# Patient Record
Sex: Female | Born: 1965
Health system: Southern US, Community
[De-identification: ages and names within clinical notes are randomized; demographics above are authoritative.]

## PROBLEM LIST (undated history)

## (undated) DIAGNOSIS — E119 Type 2 diabetes mellitus without complications: Secondary | ICD-10-CM

## (undated) DIAGNOSIS — R252 Cramp and spasm: Secondary | ICD-10-CM

## (undated) DIAGNOSIS — R232 Flushing: Secondary | ICD-10-CM

## (undated) DIAGNOSIS — E1110 Type 2 diabetes mellitus with ketoacidosis without coma: Secondary | ICD-10-CM

## (undated) DIAGNOSIS — E1143 Type 2 diabetes mellitus with diabetic autonomic (poly)neuropathy: Secondary | ICD-10-CM

## (undated) DIAGNOSIS — R0789 Other chest pain: Secondary | ICD-10-CM

## (undated) DIAGNOSIS — F17200 Nicotine dependence, unspecified, uncomplicated: Secondary | ICD-10-CM

## (undated) DIAGNOSIS — G8929 Other chronic pain: Secondary | ICD-10-CM

## (undated) DIAGNOSIS — N183 Chronic kidney disease, stage 3 unspecified: Secondary | ICD-10-CM

## (undated) DIAGNOSIS — M2041 Other hammer toe(s) (acquired), right foot: Secondary | ICD-10-CM

## (undated) DIAGNOSIS — F339 Major depressive disorder, recurrent, unspecified: Secondary | ICD-10-CM

## (undated) DIAGNOSIS — N179 Acute kidney failure, unspecified: Secondary | ICD-10-CM

## (undated) DIAGNOSIS — F39 Unspecified mood [affective] disorder: Secondary | ICD-10-CM

## (undated) DIAGNOSIS — F329 Major depressive disorder, single episode, unspecified: Secondary | ICD-10-CM

## (undated) DIAGNOSIS — K3184 Gastroparesis: Secondary | ICD-10-CM

## (undated) DIAGNOSIS — E43 Unspecified severe protein-calorie malnutrition: Secondary | ICD-10-CM

## (undated) DIAGNOSIS — T4145XA Adverse effect of unspecified anesthetic, initial encounter: Secondary | ICD-10-CM

## (undated) DIAGNOSIS — I10 Essential (primary) hypertension: Secondary | ICD-10-CM

## (undated) DIAGNOSIS — M67921 Unspecified disorder of synovium and tendon, right upper arm: Secondary | ICD-10-CM

## (undated) DIAGNOSIS — K5649 Other impaction of intestine: Secondary | ICD-10-CM

## (undated) DIAGNOSIS — I219 Acute myocardial infarction, unspecified: Secondary | ICD-10-CM

## (undated) DIAGNOSIS — T8859XA Other complications of anesthesia, initial encounter: Secondary | ICD-10-CM

## (undated) DIAGNOSIS — K2210 Ulcer of esophagus without bleeding: Secondary | ICD-10-CM

## (undated) DIAGNOSIS — G43909 Migraine, unspecified, not intractable, without status migrainosus: Secondary | ICD-10-CM

## (undated) DIAGNOSIS — R300 Dysuria: Secondary | ICD-10-CM

## (undated) DIAGNOSIS — R319 Hematuria, unspecified: Secondary | ICD-10-CM

## (undated) DIAGNOSIS — E1322 Other specified diabetes mellitus with diabetic chronic kidney disease: Secondary | ICD-10-CM

## (undated) DIAGNOSIS — D649 Anemia, unspecified: Secondary | ICD-10-CM

## (undated) DIAGNOSIS — K859 Acute pancreatitis without necrosis or infection, unspecified: Secondary | ICD-10-CM

## (undated) DIAGNOSIS — R1115 Cyclical vomiting syndrome unrelated to migraine: Secondary | ICD-10-CM

## (undated) DIAGNOSIS — J449 Chronic obstructive pulmonary disease, unspecified: Secondary | ICD-10-CM

## (undated) DIAGNOSIS — F418 Other specified anxiety disorders: Secondary | ICD-10-CM

## (undated) DIAGNOSIS — B351 Tinea unguium: Secondary | ICD-10-CM

## (undated) DIAGNOSIS — Z9189 Other specified personal risk factors, not elsewhere classified: Secondary | ICD-10-CM

## (undated) DIAGNOSIS — F1290 Cannabis use, unspecified, uncomplicated: Secondary | ICD-10-CM

## (undated) DIAGNOSIS — R112 Nausea with vomiting, unspecified: Secondary | ICD-10-CM

## (undated) DIAGNOSIS — M1990 Unspecified osteoarthritis, unspecified site: Secondary | ICD-10-CM

## (undated) DIAGNOSIS — M75100 Unspecified rotator cuff tear or rupture of unspecified shoulder, not specified as traumatic: Secondary | ICD-10-CM

## (undated) DIAGNOSIS — Z609 Problem related to social environment, unspecified: Secondary | ICD-10-CM

## (undated) DIAGNOSIS — R1084 Generalized abdominal pain: Secondary | ICD-10-CM

## (undated) DIAGNOSIS — F4311 Post-traumatic stress disorder, acute: Secondary | ICD-10-CM

## (undated) DIAGNOSIS — H3320 Serous retinal detachment, unspecified eye: Secondary | ICD-10-CM

## (undated) DIAGNOSIS — E1365 Other specified diabetes mellitus with hyperglycemia: Secondary | ICD-10-CM

## (undated) DIAGNOSIS — E559 Vitamin D deficiency, unspecified: Secondary | ICD-10-CM

## (undated) DIAGNOSIS — G709 Myoneural disorder, unspecified: Secondary | ICD-10-CM

## (undated) DIAGNOSIS — K30 Functional dyspepsia: Secondary | ICD-10-CM

## (undated) DIAGNOSIS — M2042 Other hammer toe(s) (acquired), left foot: Secondary | ICD-10-CM

## (undated) DIAGNOSIS — K209 Esophagitis, unspecified: Secondary | ICD-10-CM

## (undated) DIAGNOSIS — E1149 Type 2 diabetes mellitus with other diabetic neurological complication: Principal | ICD-10-CM

## (undated) DIAGNOSIS — R1110 Vomiting, unspecified: Secondary | ICD-10-CM

## (undated) DIAGNOSIS — N189 Chronic kidney disease, unspecified: Secondary | ICD-10-CM

## (undated) DIAGNOSIS — J189 Pneumonia, unspecified organism: Secondary | ICD-10-CM

## (undated) DIAGNOSIS — R011 Cardiac murmur, unspecified: Secondary | ICD-10-CM

## (undated) DIAGNOSIS — G4700 Insomnia, unspecified: Secondary | ICD-10-CM

## (undated) DIAGNOSIS — M545 Low back pain: Secondary | ICD-10-CM

## (undated) DIAGNOSIS — M25551 Pain in right hip: Secondary | ICD-10-CM

## (undated) DIAGNOSIS — E7800 Pure hypercholesterolemia, unspecified: Secondary | ICD-10-CM

## (undated) DIAGNOSIS — R457 State of emotional shock and stress, unspecified: Secondary | ICD-10-CM

## (undated) DIAGNOSIS — F32A Depression, unspecified: Secondary | ICD-10-CM

## (undated) DIAGNOSIS — R32 Unspecified urinary incontinence: Secondary | ICD-10-CM

## (undated) DIAGNOSIS — T7840XA Allergy, unspecified, initial encounter: Secondary | ICD-10-CM

## (undated) DIAGNOSIS — Z5189 Encounter for other specified aftercare: Secondary | ICD-10-CM

## (undated) DIAGNOSIS — H00019 Hordeolum externum unspecified eye, unspecified eyelid: Secondary | ICD-10-CM

## (undated) DIAGNOSIS — L84 Corns and callosities: Secondary | ICD-10-CM

## (undated) DIAGNOSIS — K219 Gastro-esophageal reflux disease without esophagitis: Secondary | ICD-10-CM

## (undated) DIAGNOSIS — K5900 Constipation, unspecified: Secondary | ICD-10-CM

## (undated) DIAGNOSIS — H269 Unspecified cataract: Secondary | ICD-10-CM

## (undated) DIAGNOSIS — Z86010 Personal history of colonic polyps: Secondary | ICD-10-CM

## (undated) DIAGNOSIS — F419 Anxiety disorder, unspecified: Secondary | ICD-10-CM

## (undated) HISTORY — DX: Major depressive disorder, recurrent, unspecified: F33.9

## (undated) HISTORY — PX: SMALL INTESTINE SURGERY: SHX150

## (undated) HISTORY — PX: COLONOSCOPY: SHX174

## (undated) HISTORY — DX: Unspecified osteoarthritis, unspecified site: M19.90

## (undated) HISTORY — DX: State of emotional shock and stress, unspecified: R45.7

## (undated) HISTORY — DX: Esophagitis, unspecified: K20.9

## (undated) HISTORY — PX: CATARACT EXTRACTION W/ INTRAOCULAR LENS  IMPLANT, BILATERAL: SHX1307

## (undated) HISTORY — PX: UPPER GASTROINTESTINAL ENDOSCOPY: SHX188

## (undated) HISTORY — DX: Constipation, unspecified: K59.00

## (undated) HISTORY — DX: Other chest pain: R07.89

## (undated) HISTORY — DX: Problem related to social environment, unspecified: Z60.9

## (undated) HISTORY — DX: Pure hypercholesterolemia, unspecified: E78.00

## (undated) HISTORY — DX: Myoneural disorder, unspecified: G70.9

## (undated) HISTORY — DX: Unspecified rotator cuff tear or rupture of unspecified shoulder, not specified as traumatic: M75.100

## (undated) HISTORY — DX: Unspecified disorder of synovium and tendon, right upper arm: M67.921

## (undated) HISTORY — DX: Acute kidney failure, unspecified: N17.9

## (undated) HISTORY — DX: Generalized abdominal pain: R10.84

## (undated) HISTORY — DX: Tinea unguium: B35.1

## (undated) HISTORY — DX: Hematuria, unspecified: R31.9

## (undated) HISTORY — DX: Dysuria: R30.0

## (undated) HISTORY — DX: Vitamin D deficiency, unspecified: E55.9

## (undated) HISTORY — DX: Cramp and spasm: R25.2

## (undated) HISTORY — DX: Anemia, unspecified: D64.9

## (undated) HISTORY — PX: UPPER GI ENDOSCOPY: SHX6162

## (undated) HISTORY — DX: Unspecified mood (affective) disorder: F39

## (undated) HISTORY — DX: Nausea with vomiting, unspecified: R11.2

## (undated) HISTORY — DX: Flushing: R23.2

## (undated) HISTORY — PX: CARDIAC CATHETERIZATION: SHX172

## (undated) HISTORY — PX: DILATION AND EVACUATION: SHX1459

## (undated) HISTORY — PX: CHOLECYSTECTOMY: SHX55

## (undated) HISTORY — DX: Other hammer toe(s) (acquired), right foot: M20.41

## (undated) HISTORY — DX: Other specified anxiety disorders: F41.8

## (undated) HISTORY — DX: Essential (primary) hypertension: I10

## (undated) HISTORY — DX: Other specified diabetes mellitus with hyperglycemia: E13.65

## (undated) HISTORY — DX: Other hammer toe(s) (acquired), left foot: M20.42

## (undated) HISTORY — DX: Low back pain: M54.5

## (undated) HISTORY — DX: Unspecified urinary incontinence: R32

## (undated) HISTORY — DX: Other specified personal risk factors, not elsewhere classified: Z91.89

## (undated) HISTORY — DX: Vomiting, unspecified: R11.10

## (undated) HISTORY — DX: Allergy, unspecified, initial encounter: T78.40XA

## (undated) HISTORY — DX: Personal history of colonic polyps: Z86.010

## (undated) HISTORY — DX: Unspecified severe protein-calorie malnutrition: E43

## (undated) HISTORY — DX: Type 2 diabetes mellitus with ketoacidosis without coma: E11.10

## (undated) HISTORY — DX: Chronic kidney disease, unspecified: N18.9

## (undated) HISTORY — DX: Migraine, unspecified, not intractable, without status migrainosus: G43.909

## (undated) HISTORY — PX: OTHER SURGICAL HISTORY: SHX169

## (undated) HISTORY — DX: Chronic kidney disease, stage 3 (moderate): N18.3

## (undated) HISTORY — DX: Unspecified cataract: H26.9

## (undated) HISTORY — DX: Nicotine dependence, unspecified, uncomplicated: F17.200

## (undated) HISTORY — DX: Other chronic pain: G89.29

## (undated) HISTORY — DX: Functional dyspepsia: K30

## (undated) HISTORY — DX: Serous retinal detachment, unspecified eye: H33.20

## (undated) HISTORY — DX: Insomnia, unspecified: G47.00

## (undated) HISTORY — DX: Other specified diabetes mellitus with diabetic chronic kidney disease: E13.22

---
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
## 1998-06-29 ENCOUNTER — Emergency Department (HOSPITAL_COMMUNITY): Admission: EM | Admit: 1998-06-29 | Discharge: 1998-06-29 | Payer: Self-pay | Admitting: Emergency Medicine

## 1998-07-29 ENCOUNTER — Emergency Department (HOSPITAL_COMMUNITY): Admission: EM | Admit: 1998-07-29 | Discharge: 1998-07-29 | Payer: Self-pay | Admitting: Emergency Medicine

## 1998-12-04 ENCOUNTER — Emergency Department (HOSPITAL_COMMUNITY): Admission: EM | Admit: 1998-12-04 | Discharge: 1998-12-04 | Payer: Self-pay | Admitting: Emergency Medicine

## 1999-05-19 ENCOUNTER — Emergency Department (HOSPITAL_COMMUNITY): Admission: EM | Admit: 1999-05-19 | Discharge: 1999-05-19 | Payer: Self-pay | Admitting: Emergency Medicine

## 1999-05-25 ENCOUNTER — Encounter: Payer: Self-pay | Admitting: Emergency Medicine

## 1999-05-25 ENCOUNTER — Ambulatory Visit (HOSPITAL_COMMUNITY): Admission: RE | Admit: 1999-05-25 | Discharge: 1999-05-25 | Payer: Self-pay | Admitting: Emergency Medicine

## 1999-08-01 ENCOUNTER — Emergency Department (HOSPITAL_COMMUNITY): Admission: EM | Admit: 1999-08-01 | Discharge: 1999-08-01 | Payer: Self-pay | Admitting: Emergency Medicine

## 1999-08-01 ENCOUNTER — Encounter: Payer: Self-pay | Admitting: Emergency Medicine

## 2000-06-27 ENCOUNTER — Encounter: Payer: Self-pay | Admitting: Emergency Medicine

## 2000-06-27 ENCOUNTER — Emergency Department (HOSPITAL_COMMUNITY): Admission: EM | Admit: 2000-06-27 | Discharge: 2000-06-27 | Payer: Self-pay | Admitting: Emergency Medicine

## 2000-12-07 ENCOUNTER — Encounter: Payer: Self-pay | Admitting: Emergency Medicine

## 2000-12-07 ENCOUNTER — Emergency Department (HOSPITAL_COMMUNITY): Admission: EM | Admit: 2000-12-07 | Discharge: 2000-12-07 | Payer: Self-pay | Admitting: Emergency Medicine

## 2000-12-15 ENCOUNTER — Emergency Department (HOSPITAL_COMMUNITY): Admission: EM | Admit: 2000-12-15 | Discharge: 2000-12-15 | Payer: Self-pay

## 2000-12-21 ENCOUNTER — Emergency Department (HOSPITAL_COMMUNITY): Admission: EM | Admit: 2000-12-21 | Discharge: 2000-12-21 | Payer: Self-pay | Admitting: Emergency Medicine

## 2000-12-29 ENCOUNTER — Encounter: Payer: Self-pay | Admitting: Emergency Medicine

## 2000-12-29 ENCOUNTER — Emergency Department (HOSPITAL_COMMUNITY): Admission: EM | Admit: 2000-12-29 | Discharge: 2000-12-29 | Payer: Self-pay

## 2000-12-30 ENCOUNTER — Encounter: Admission: RE | Admit: 2000-12-30 | Discharge: 2000-12-30 | Payer: Self-pay | Admitting: Sports Medicine

## 2001-01-20 ENCOUNTER — Encounter: Admission: RE | Admit: 2001-01-20 | Discharge: 2001-01-20 | Payer: Self-pay | Admitting: Family Medicine

## 2001-03-17 ENCOUNTER — Encounter: Admission: RE | Admit: 2001-03-17 | Discharge: 2001-03-17 | Payer: Self-pay | Admitting: Family Medicine

## 2001-11-28 ENCOUNTER — Inpatient Hospital Stay (HOSPITAL_COMMUNITY): Admission: EM | Admit: 2001-11-28 | Discharge: 2001-12-02 | Payer: Self-pay

## 2001-12-07 ENCOUNTER — Encounter: Admission: RE | Admit: 2001-12-07 | Discharge: 2001-12-07 | Payer: Self-pay | Admitting: Family Medicine

## 2001-12-27 DIAGNOSIS — Z5189 Encounter for other specified aftercare: Secondary | ICD-10-CM

## 2001-12-27 DIAGNOSIS — IMO0001 Reserved for inherently not codable concepts without codable children: Secondary | ICD-10-CM

## 2001-12-27 HISTORY — DX: Encounter for other specified aftercare: Z51.89

## 2001-12-27 HISTORY — DX: Reserved for inherently not codable concepts without codable children: IMO0001

## 2002-02-07 ENCOUNTER — Encounter: Admission: RE | Admit: 2002-02-07 | Discharge: 2002-02-07 | Payer: Self-pay | Admitting: Family Medicine

## 2002-02-08 ENCOUNTER — Encounter: Payer: Self-pay | Admitting: *Deleted

## 2002-02-08 ENCOUNTER — Inpatient Hospital Stay (HOSPITAL_COMMUNITY): Admission: AD | Admit: 2002-02-08 | Discharge: 2002-02-08 | Payer: Self-pay | Admitting: *Deleted

## 2002-02-23 ENCOUNTER — Inpatient Hospital Stay (HOSPITAL_COMMUNITY): Admission: AD | Admit: 2002-02-23 | Discharge: 2002-02-23 | Payer: Self-pay | Admitting: *Deleted

## 2002-02-24 HISTORY — PX: ABDOMINAL HYSTERECTOMY: SHX81

## 2002-02-27 ENCOUNTER — Encounter: Admission: RE | Admit: 2002-02-27 | Discharge: 2002-02-27 | Payer: Self-pay | Admitting: *Deleted

## 2002-03-08 ENCOUNTER — Encounter: Admission: RE | Admit: 2002-03-08 | Discharge: 2002-03-08 | Payer: Self-pay | Admitting: Obstetrics and Gynecology

## 2002-03-13 ENCOUNTER — Encounter (INDEPENDENT_AMBULATORY_CARE_PROVIDER_SITE_OTHER): Payer: Self-pay | Admitting: Specialist

## 2002-03-13 ENCOUNTER — Inpatient Hospital Stay (HOSPITAL_COMMUNITY): Admission: AD | Admit: 2002-03-13 | Discharge: 2002-03-16 | Payer: Self-pay | Admitting: *Deleted

## 2002-03-15 ENCOUNTER — Encounter: Payer: Self-pay | Admitting: *Deleted

## 2002-03-17 ENCOUNTER — Encounter: Payer: Self-pay | Admitting: *Deleted

## 2002-03-17 ENCOUNTER — Inpatient Hospital Stay (HOSPITAL_COMMUNITY): Admission: AD | Admit: 2002-03-17 | Discharge: 2002-03-30 | Payer: Self-pay | Admitting: *Deleted

## 2002-03-18 ENCOUNTER — Encounter (HOSPITAL_BASED_OUTPATIENT_CLINIC_OR_DEPARTMENT_OTHER): Payer: Self-pay | Admitting: General Surgery

## 2002-03-19 ENCOUNTER — Encounter: Payer: Self-pay | Admitting: *Deleted

## 2002-03-20 ENCOUNTER — Encounter (HOSPITAL_BASED_OUTPATIENT_CLINIC_OR_DEPARTMENT_OTHER): Payer: Self-pay | Admitting: General Surgery

## 2002-03-20 ENCOUNTER — Encounter: Payer: Self-pay | Admitting: *Deleted

## 2002-03-21 ENCOUNTER — Encounter (HOSPITAL_BASED_OUTPATIENT_CLINIC_OR_DEPARTMENT_OTHER): Payer: Self-pay | Admitting: General Surgery

## 2002-03-21 ENCOUNTER — Encounter: Payer: Self-pay | Admitting: *Deleted

## 2002-03-22 ENCOUNTER — Encounter (HOSPITAL_BASED_OUTPATIENT_CLINIC_OR_DEPARTMENT_OTHER): Payer: Self-pay | Admitting: General Surgery

## 2002-03-27 ENCOUNTER — Encounter (HOSPITAL_BASED_OUTPATIENT_CLINIC_OR_DEPARTMENT_OTHER): Payer: Self-pay | Admitting: General Surgery

## 2002-04-16 ENCOUNTER — Inpatient Hospital Stay (HOSPITAL_COMMUNITY): Admission: AD | Admit: 2002-04-16 | Discharge: 2002-04-16 | Payer: Self-pay | Admitting: *Deleted

## 2002-04-17 ENCOUNTER — Inpatient Hospital Stay (HOSPITAL_COMMUNITY): Admission: AD | Admit: 2002-04-17 | Discharge: 2002-04-17 | Payer: Self-pay | Admitting: *Deleted

## 2002-04-17 ENCOUNTER — Encounter: Payer: Self-pay | Admitting: *Deleted

## 2002-04-20 ENCOUNTER — Inpatient Hospital Stay (HOSPITAL_COMMUNITY): Admission: AD | Admit: 2002-04-20 | Discharge: 2002-04-20 | Payer: Self-pay | Admitting: *Deleted

## 2002-04-27 ENCOUNTER — Inpatient Hospital Stay (HOSPITAL_COMMUNITY): Admission: AD | Admit: 2002-04-27 | Discharge: 2002-04-27 | Payer: Self-pay | Admitting: *Deleted

## 2002-05-07 ENCOUNTER — Encounter: Admission: RE | Admit: 2002-05-07 | Discharge: 2002-05-07 | Payer: Self-pay | Admitting: Family Medicine

## 2002-05-12 ENCOUNTER — Encounter: Payer: Self-pay | Admitting: Emergency Medicine

## 2002-05-12 ENCOUNTER — Inpatient Hospital Stay (HOSPITAL_COMMUNITY): Admission: EM | Admit: 2002-05-12 | Discharge: 2002-05-16 | Payer: Self-pay | Admitting: Sports Medicine

## 2002-05-13 ENCOUNTER — Encounter: Payer: Self-pay | Admitting: Emergency Medicine

## 2002-05-16 ENCOUNTER — Encounter: Payer: Self-pay | Admitting: Sports Medicine

## 2002-05-22 ENCOUNTER — Encounter: Admission: RE | Admit: 2002-05-22 | Discharge: 2002-05-22 | Payer: Self-pay | Admitting: Family Medicine

## 2002-06-14 ENCOUNTER — Inpatient Hospital Stay (HOSPITAL_COMMUNITY): Admission: AD | Admit: 2002-06-14 | Discharge: 2002-06-14 | Payer: Self-pay | Admitting: *Deleted

## 2002-06-15 ENCOUNTER — Inpatient Hospital Stay (HOSPITAL_COMMUNITY): Admission: AD | Admit: 2002-06-15 | Discharge: 2002-06-18 | Payer: Self-pay | Admitting: Sports Medicine

## 2002-06-15 ENCOUNTER — Encounter: Admission: RE | Admit: 2002-06-15 | Discharge: 2002-06-15 | Payer: Self-pay | Admitting: Family Medicine

## 2002-06-15 ENCOUNTER — Encounter: Payer: Self-pay | Admitting: Family Medicine

## 2002-07-13 ENCOUNTER — Encounter: Admission: RE | Admit: 2002-07-13 | Discharge: 2002-07-13 | Payer: Self-pay | Admitting: Family Medicine

## 2002-11-13 ENCOUNTER — Emergency Department (HOSPITAL_COMMUNITY): Admission: EM | Admit: 2002-11-13 | Discharge: 2002-11-13 | Payer: Self-pay | Admitting: Emergency Medicine

## 2002-11-13 ENCOUNTER — Encounter: Payer: Self-pay | Admitting: Emergency Medicine

## 2003-06-17 ENCOUNTER — Encounter: Admission: RE | Admit: 2003-06-17 | Discharge: 2003-06-17 | Payer: Self-pay | Admitting: Family Medicine

## 2003-06-24 ENCOUNTER — Encounter: Admission: RE | Admit: 2003-06-24 | Discharge: 2003-06-24 | Payer: Self-pay | Admitting: Sports Medicine

## 2003-07-24 ENCOUNTER — Encounter: Admission: RE | Admit: 2003-07-24 | Discharge: 2003-07-24 | Payer: Self-pay | Admitting: Family Medicine

## 2003-07-31 ENCOUNTER — Encounter (HOSPITAL_BASED_OUTPATIENT_CLINIC_OR_DEPARTMENT_OTHER): Admission: RE | Admit: 2003-07-31 | Discharge: 2003-10-08 | Payer: Self-pay | Admitting: Internal Medicine

## 2003-10-19 ENCOUNTER — Encounter: Payer: Self-pay | Admitting: *Deleted

## 2003-10-19 ENCOUNTER — Inpatient Hospital Stay (HOSPITAL_COMMUNITY): Admission: EM | Admit: 2003-10-19 | Discharge: 2003-10-21 | Payer: Self-pay | Admitting: *Deleted

## 2003-10-20 ENCOUNTER — Encounter: Payer: Self-pay | Admitting: Family Medicine

## 2003-10-28 ENCOUNTER — Encounter: Admission: RE | Admit: 2003-10-28 | Discharge: 2003-10-28 | Payer: Self-pay | Admitting: Family Medicine

## 2003-11-08 ENCOUNTER — Encounter: Admission: RE | Admit: 2003-11-08 | Discharge: 2003-11-08 | Payer: Self-pay | Admitting: Family Medicine

## 2003-11-12 ENCOUNTER — Encounter: Admission: RE | Admit: 2003-11-12 | Discharge: 2003-11-12 | Payer: Self-pay | Admitting: Family Medicine

## 2003-11-19 ENCOUNTER — Encounter: Admission: RE | Admit: 2003-11-19 | Discharge: 2003-11-19 | Payer: Self-pay | Admitting: Family Medicine

## 2004-01-03 ENCOUNTER — Encounter: Admission: RE | Admit: 2004-01-03 | Discharge: 2004-01-03 | Payer: Self-pay | Admitting: Family Medicine

## 2004-01-22 ENCOUNTER — Inpatient Hospital Stay (HOSPITAL_COMMUNITY): Admission: EM | Admit: 2004-01-22 | Discharge: 2004-01-23 | Payer: Self-pay | Admitting: Emergency Medicine

## 2004-01-25 ENCOUNTER — Inpatient Hospital Stay (HOSPITAL_COMMUNITY): Admission: EM | Admit: 2004-01-25 | Discharge: 2004-01-29 | Payer: Self-pay | Admitting: Emergency Medicine

## 2004-01-27 ENCOUNTER — Encounter: Payer: Self-pay | Admitting: Internal Medicine

## 2004-01-27 DIAGNOSIS — K219 Gastro-esophageal reflux disease without esophagitis: Secondary | ICD-10-CM

## 2004-02-10 ENCOUNTER — Encounter: Admission: RE | Admit: 2004-02-10 | Discharge: 2004-02-10 | Payer: Self-pay | Admitting: Family Medicine

## 2004-07-22 ENCOUNTER — Encounter: Admission: RE | Admit: 2004-07-22 | Discharge: 2004-07-22 | Payer: Self-pay | Admitting: Family Medicine

## 2004-07-24 ENCOUNTER — Encounter: Admission: RE | Admit: 2004-07-24 | Discharge: 2004-07-24 | Payer: Self-pay | Admitting: Sports Medicine

## 2004-08-03 ENCOUNTER — Encounter: Admission: RE | Admit: 2004-08-03 | Discharge: 2004-08-03 | Payer: Self-pay | Admitting: Family Medicine

## 2004-08-12 ENCOUNTER — Encounter (HOSPITAL_BASED_OUTPATIENT_CLINIC_OR_DEPARTMENT_OTHER): Admission: RE | Admit: 2004-08-12 | Discharge: 2004-10-27 | Payer: Self-pay | Admitting: Internal Medicine

## 2004-08-17 ENCOUNTER — Encounter: Admission: RE | Admit: 2004-08-17 | Discharge: 2004-08-17 | Payer: Self-pay | Admitting: Sports Medicine

## 2004-10-06 ENCOUNTER — Ambulatory Visit: Payer: Self-pay | Admitting: Sports Medicine

## 2004-11-10 ENCOUNTER — Encounter (HOSPITAL_BASED_OUTPATIENT_CLINIC_OR_DEPARTMENT_OTHER): Admission: RE | Admit: 2004-11-10 | Discharge: 2005-02-10 | Payer: Self-pay | Admitting: Internal Medicine

## 2004-12-01 ENCOUNTER — Ambulatory Visit: Payer: Self-pay | Admitting: Family Medicine

## 2005-01-05 ENCOUNTER — Ambulatory Visit: Payer: Self-pay | Admitting: Family Medicine

## 2005-02-04 ENCOUNTER — Emergency Department (HOSPITAL_COMMUNITY): Admission: EM | Admit: 2005-02-04 | Discharge: 2005-02-04 | Payer: Self-pay | Admitting: Emergency Medicine

## 2005-02-15 ENCOUNTER — Encounter (HOSPITAL_BASED_OUTPATIENT_CLINIC_OR_DEPARTMENT_OTHER): Admission: RE | Admit: 2005-02-15 | Discharge: 2005-03-12 | Payer: Self-pay | Admitting: Internal Medicine

## 2005-03-24 ENCOUNTER — Ambulatory Visit: Payer: Self-pay | Admitting: Sports Medicine

## 2005-03-24 ENCOUNTER — Observation Stay (HOSPITAL_COMMUNITY): Admission: EM | Admit: 2005-03-24 | Discharge: 2005-03-26 | Payer: Self-pay | Admitting: Emergency Medicine

## 2005-04-07 ENCOUNTER — Ambulatory Visit: Payer: Self-pay | Admitting: Family Medicine

## 2005-04-14 ENCOUNTER — Encounter (HOSPITAL_BASED_OUTPATIENT_CLINIC_OR_DEPARTMENT_OTHER): Admission: RE | Admit: 2005-04-14 | Discharge: 2005-07-13 | Payer: Self-pay | Admitting: Surgery

## 2005-04-22 ENCOUNTER — Ambulatory Visit: Payer: Self-pay | Admitting: Family Medicine

## 2005-04-30 ENCOUNTER — Encounter: Admission: RE | Admit: 2005-04-30 | Discharge: 2005-07-29 | Payer: Self-pay | Admitting: Family Medicine

## 2005-05-14 ENCOUNTER — Encounter: Admission: RE | Admit: 2005-05-14 | Discharge: 2005-05-14 | Payer: Self-pay | Admitting: Sports Medicine

## 2005-05-16 ENCOUNTER — Emergency Department (HOSPITAL_COMMUNITY): Admission: EM | Admit: 2005-05-16 | Discharge: 2005-05-16 | Payer: Self-pay | Admitting: Emergency Medicine

## 2005-06-11 ENCOUNTER — Emergency Department (HOSPITAL_COMMUNITY): Admission: EM | Admit: 2005-06-11 | Discharge: 2005-06-11 | Payer: Self-pay | Admitting: Family Medicine

## 2005-06-21 ENCOUNTER — Ambulatory Visit: Payer: Self-pay | Admitting: Family Medicine

## 2005-06-21 ENCOUNTER — Inpatient Hospital Stay (HOSPITAL_COMMUNITY): Admission: AD | Admit: 2005-06-21 | Discharge: 2005-06-29 | Payer: Self-pay | Admitting: Family Medicine

## 2005-07-08 ENCOUNTER — Ambulatory Visit: Payer: Self-pay | Admitting: Family Medicine

## 2005-07-16 ENCOUNTER — Encounter (HOSPITAL_BASED_OUTPATIENT_CLINIC_OR_DEPARTMENT_OTHER): Admission: RE | Admit: 2005-07-16 | Discharge: 2005-10-14 | Payer: Self-pay | Admitting: Surgery

## 2005-07-27 ENCOUNTER — Ambulatory Visit: Payer: Self-pay | Admitting: Sports Medicine

## 2005-08-11 ENCOUNTER — Ambulatory Visit: Payer: Self-pay | Admitting: Family Medicine

## 2005-08-11 HISTORY — PX: OTHER SURGICAL HISTORY: SHX169

## 2005-08-12 ENCOUNTER — Encounter: Admission: RE | Admit: 2005-08-12 | Discharge: 2005-08-12 | Payer: Self-pay

## 2005-08-13 ENCOUNTER — Ambulatory Visit (HOSPITAL_COMMUNITY): Admission: RE | Admit: 2005-08-13 | Discharge: 2005-08-13 | Payer: Self-pay

## 2005-08-13 ENCOUNTER — Ambulatory Visit (HOSPITAL_BASED_OUTPATIENT_CLINIC_OR_DEPARTMENT_OTHER): Admission: RE | Admit: 2005-08-13 | Discharge: 2005-08-13 | Payer: Self-pay

## 2005-09-03 ENCOUNTER — Ambulatory Visit: Payer: Self-pay | Admitting: Family Medicine

## 2005-09-30 ENCOUNTER — Encounter: Admission: RE | Admit: 2005-09-30 | Discharge: 2005-09-30 | Payer: Self-pay

## 2006-03-06 ENCOUNTER — Ambulatory Visit: Payer: Self-pay | Admitting: Family Medicine

## 2006-03-06 ENCOUNTER — Inpatient Hospital Stay (HOSPITAL_COMMUNITY): Admission: EM | Admit: 2006-03-06 | Discharge: 2006-03-09 | Payer: Self-pay | Admitting: Family Medicine

## 2006-03-08 ENCOUNTER — Encounter (INDEPENDENT_AMBULATORY_CARE_PROVIDER_SITE_OTHER): Payer: Self-pay | Admitting: Cardiology

## 2006-04-14 ENCOUNTER — Ambulatory Visit: Payer: Self-pay | Admitting: Family Medicine

## 2006-04-15 ENCOUNTER — Ambulatory Visit: Payer: Self-pay | Admitting: Family Medicine

## 2006-09-28 ENCOUNTER — Ambulatory Visit: Payer: Self-pay | Admitting: Family Medicine

## 2006-09-30 ENCOUNTER — Ambulatory Visit: Payer: Self-pay | Admitting: Family Medicine

## 2006-10-12 ENCOUNTER — Ambulatory Visit: Payer: Self-pay | Admitting: Family Medicine

## 2006-10-17 ENCOUNTER — Ambulatory Visit: Payer: Self-pay | Admitting: Family Medicine

## 2006-10-21 ENCOUNTER — Ambulatory Visit: Payer: Self-pay | Admitting: Family Medicine

## 2006-11-03 ENCOUNTER — Ambulatory Visit: Payer: Self-pay | Admitting: Sports Medicine

## 2006-11-10 ENCOUNTER — Ambulatory Visit: Payer: Self-pay | Admitting: Family Medicine

## 2006-11-16 ENCOUNTER — Ambulatory Visit: Payer: Self-pay | Admitting: Family Medicine

## 2006-11-24 ENCOUNTER — Encounter (HOSPITAL_BASED_OUTPATIENT_CLINIC_OR_DEPARTMENT_OTHER): Admission: RE | Admit: 2006-11-24 | Discharge: 2007-01-25 | Payer: Self-pay | Admitting: Surgery

## 2006-11-28 ENCOUNTER — Ambulatory Visit (HOSPITAL_COMMUNITY): Admission: RE | Admit: 2006-11-28 | Discharge: 2006-11-28 | Payer: Self-pay | Admitting: Surgery

## 2006-12-08 ENCOUNTER — Ambulatory Visit: Payer: Self-pay | Admitting: Family Medicine

## 2006-12-08 ENCOUNTER — Inpatient Hospital Stay (HOSPITAL_COMMUNITY): Admission: EM | Admit: 2006-12-08 | Discharge: 2006-12-10 | Payer: Self-pay | Admitting: Emergency Medicine

## 2006-12-12 ENCOUNTER — Ambulatory Visit: Payer: Self-pay | Admitting: Sports Medicine

## 2006-12-15 ENCOUNTER — Ambulatory Visit: Payer: Self-pay | Admitting: Sports Medicine

## 2007-01-04 ENCOUNTER — Ambulatory Visit: Payer: Self-pay | Admitting: Family Medicine

## 2007-01-17 ENCOUNTER — Ambulatory Visit: Payer: Self-pay | Admitting: Family Medicine

## 2007-01-17 ENCOUNTER — Inpatient Hospital Stay (HOSPITAL_COMMUNITY): Admission: EM | Admit: 2007-01-17 | Discharge: 2007-01-20 | Payer: Self-pay | Admitting: Emergency Medicine

## 2007-01-25 ENCOUNTER — Ambulatory Visit: Payer: Self-pay | Admitting: Family Medicine

## 2007-02-03 ENCOUNTER — Encounter (HOSPITAL_BASED_OUTPATIENT_CLINIC_OR_DEPARTMENT_OTHER): Admission: RE | Admit: 2007-02-03 | Discharge: 2007-02-23 | Payer: Self-pay | Admitting: Surgery

## 2007-02-11 ENCOUNTER — Inpatient Hospital Stay (HOSPITAL_COMMUNITY): Admission: EM | Admit: 2007-02-11 | Discharge: 2007-02-13 | Payer: Self-pay | Admitting: Emergency Medicine

## 2007-02-11 ENCOUNTER — Ambulatory Visit: Payer: Self-pay | Admitting: Family Medicine

## 2007-02-12 ENCOUNTER — Encounter (INDEPENDENT_AMBULATORY_CARE_PROVIDER_SITE_OTHER): Payer: Self-pay | Admitting: *Deleted

## 2007-02-21 ENCOUNTER — Inpatient Hospital Stay (HOSPITAL_COMMUNITY): Admission: EM | Admit: 2007-02-21 | Discharge: 2007-02-24 | Payer: Self-pay | Admitting: Emergency Medicine

## 2007-02-21 ENCOUNTER — Ambulatory Visit: Payer: Self-pay | Admitting: Family Medicine

## 2007-02-23 DIAGNOSIS — F17200 Nicotine dependence, unspecified, uncomplicated: Secondary | ICD-10-CM

## 2007-02-23 DIAGNOSIS — F5104 Psychophysiologic insomnia: Secondary | ICD-10-CM | POA: Insufficient documentation

## 2007-02-23 DIAGNOSIS — I10 Essential (primary) hypertension: Secondary | ICD-10-CM

## 2007-02-23 DIAGNOSIS — E1165 Type 2 diabetes mellitus with hyperglycemia: Secondary | ICD-10-CM

## 2007-02-23 DIAGNOSIS — G43909 Migraine, unspecified, not intractable, without status migrainosus: Secondary | ICD-10-CM | POA: Insufficient documentation

## 2007-02-23 DIAGNOSIS — Z720 Tobacco use: Secondary | ICD-10-CM | POA: Insufficient documentation

## 2007-02-23 DIAGNOSIS — F411 Generalized anxiety disorder: Secondary | ICD-10-CM

## 2007-02-23 DIAGNOSIS — E118 Type 2 diabetes mellitus with unspecified complications: Secondary | ICD-10-CM

## 2007-02-23 DIAGNOSIS — G4700 Insomnia, unspecified: Secondary | ICD-10-CM

## 2007-02-23 DIAGNOSIS — E1149 Type 2 diabetes mellitus with other diabetic neurological complication: Secondary | ICD-10-CM

## 2007-02-23 DIAGNOSIS — F418 Other specified anxiety disorders: Secondary | ICD-10-CM

## 2007-02-23 DIAGNOSIS — F4310 Post-traumatic stress disorder, unspecified: Secondary | ICD-10-CM | POA: Insufficient documentation

## 2007-02-23 DIAGNOSIS — F419 Anxiety disorder, unspecified: Secondary | ICD-10-CM | POA: Insufficient documentation

## 2007-02-23 HISTORY — DX: Nicotine dependence, unspecified, uncomplicated: F17.200

## 2007-02-23 HISTORY — DX: Insomnia, unspecified: G47.00

## 2007-02-23 HISTORY — DX: Type 2 diabetes mellitus with other diabetic neurological complication: E11.49

## 2007-02-23 HISTORY — DX: Psychophysiologic insomnia: F51.04

## 2007-02-23 HISTORY — DX: Essential (primary) hypertension: I10

## 2007-02-23 HISTORY — DX: Other specified anxiety disorders: F41.8

## 2007-02-23 HISTORY — DX: Migraine, unspecified, not intractable, without status migrainosus: G43.909

## 2007-03-15 ENCOUNTER — Encounter: Payer: Self-pay | Admitting: *Deleted

## 2007-03-15 ENCOUNTER — Telehealth: Payer: Self-pay | Admitting: *Deleted

## 2007-03-15 ENCOUNTER — Inpatient Hospital Stay (HOSPITAL_COMMUNITY): Admission: EM | Admit: 2007-03-15 | Discharge: 2007-03-17 | Payer: Self-pay | Admitting: Emergency Medicine

## 2007-03-15 ENCOUNTER — Ambulatory Visit: Payer: Self-pay | Admitting: Family Medicine

## 2007-04-03 ENCOUNTER — Ambulatory Visit: Payer: Self-pay | Admitting: Family Medicine

## 2007-04-03 ENCOUNTER — Inpatient Hospital Stay (HOSPITAL_COMMUNITY): Admission: EM | Admit: 2007-04-03 | Discharge: 2007-04-05 | Payer: Self-pay | Admitting: Emergency Medicine

## 2007-04-10 ENCOUNTER — Ambulatory Visit: Payer: Self-pay | Admitting: Sports Medicine

## 2007-04-10 DIAGNOSIS — M545 Low back pain, unspecified: Secondary | ICD-10-CM

## 2007-04-10 DIAGNOSIS — G8929 Other chronic pain: Secondary | ICD-10-CM

## 2007-04-10 HISTORY — DX: Low back pain, unspecified: M54.50

## 2007-04-11 ENCOUNTER — Telehealth (INDEPENDENT_AMBULATORY_CARE_PROVIDER_SITE_OTHER): Payer: Self-pay | Admitting: *Deleted

## 2007-04-17 ENCOUNTER — Telehealth: Payer: Self-pay | Admitting: *Deleted

## 2007-04-17 ENCOUNTER — Emergency Department (HOSPITAL_COMMUNITY): Admission: EM | Admit: 2007-04-17 | Discharge: 2007-04-17 | Payer: Self-pay | Admitting: Emergency Medicine

## 2007-05-02 ENCOUNTER — Telehealth (INDEPENDENT_AMBULATORY_CARE_PROVIDER_SITE_OTHER): Payer: Self-pay | Admitting: *Deleted

## 2007-05-04 ENCOUNTER — Telehealth (INDEPENDENT_AMBULATORY_CARE_PROVIDER_SITE_OTHER): Payer: Self-pay | Admitting: *Deleted

## 2007-05-04 ENCOUNTER — Telehealth: Payer: Self-pay | Admitting: *Deleted

## 2007-05-05 ENCOUNTER — Inpatient Hospital Stay (HOSPITAL_COMMUNITY): Admission: EM | Admit: 2007-05-05 | Discharge: 2007-05-07 | Payer: Self-pay | Admitting: Emergency Medicine

## 2007-05-05 ENCOUNTER — Ambulatory Visit: Payer: Self-pay | Admitting: Family Medicine

## 2007-05-09 ENCOUNTER — Encounter (INDEPENDENT_AMBULATORY_CARE_PROVIDER_SITE_OTHER): Payer: Self-pay | Admitting: *Deleted

## 2007-05-31 ENCOUNTER — Emergency Department (HOSPITAL_COMMUNITY): Admission: EM | Admit: 2007-05-31 | Discharge: 2007-05-31 | Payer: Self-pay | Admitting: Emergency Medicine

## 2007-06-06 ENCOUNTER — Emergency Department (HOSPITAL_COMMUNITY): Admission: EM | Admit: 2007-06-06 | Discharge: 2007-06-06 | Payer: Self-pay | Admitting: Emergency Medicine

## 2007-06-06 ENCOUNTER — Emergency Department (HOSPITAL_COMMUNITY): Admission: EM | Admit: 2007-06-06 | Discharge: 2007-06-07 | Payer: Self-pay | Admitting: Emergency Medicine

## 2007-06-07 ENCOUNTER — Ambulatory Visit: Payer: Self-pay | Admitting: Family Medicine

## 2007-06-07 ENCOUNTER — Telehealth: Payer: Self-pay | Admitting: *Deleted

## 2007-06-07 ENCOUNTER — Inpatient Hospital Stay (HOSPITAL_COMMUNITY): Admission: AD | Admit: 2007-06-07 | Discharge: 2007-06-10 | Payer: Self-pay | Admitting: Family Medicine

## 2007-06-08 ENCOUNTER — Encounter (INDEPENDENT_AMBULATORY_CARE_PROVIDER_SITE_OTHER): Payer: Self-pay | Admitting: *Deleted

## 2007-06-13 ENCOUNTER — Ambulatory Visit: Payer: Self-pay | Admitting: Family Medicine

## 2007-06-13 LAB — CONVERTED CEMR LAB: Hgb A1c MFr Bld: 6.5 %

## 2007-06-18 ENCOUNTER — Inpatient Hospital Stay (HOSPITAL_COMMUNITY): Admission: EM | Admit: 2007-06-18 | Discharge: 2007-06-21 | Payer: Self-pay | Admitting: Emergency Medicine

## 2007-06-18 ENCOUNTER — Ambulatory Visit: Payer: Self-pay | Admitting: Family Medicine

## 2007-06-22 ENCOUNTER — Telehealth: Payer: Self-pay | Admitting: *Deleted

## 2007-06-27 ENCOUNTER — Telehealth: Payer: Self-pay | Admitting: *Deleted

## 2007-06-27 ENCOUNTER — Ambulatory Visit: Payer: Self-pay | Admitting: Family Medicine

## 2007-07-03 ENCOUNTER — Telehealth (INDEPENDENT_AMBULATORY_CARE_PROVIDER_SITE_OTHER): Payer: Self-pay | Admitting: *Deleted

## 2007-07-10 ENCOUNTER — Encounter (INDEPENDENT_AMBULATORY_CARE_PROVIDER_SITE_OTHER): Payer: Self-pay | Admitting: *Deleted

## 2007-07-17 ENCOUNTER — Ambulatory Visit: Payer: Self-pay | Admitting: Internal Medicine

## 2007-07-28 DIAGNOSIS — I219 Acute myocardial infarction, unspecified: Secondary | ICD-10-CM

## 2007-07-28 HISTORY — DX: Acute myocardial infarction, unspecified: I21.9

## 2007-08-16 ENCOUNTER — Telehealth: Payer: Self-pay | Admitting: *Deleted

## 2007-08-16 ENCOUNTER — Ambulatory Visit: Payer: Self-pay | Admitting: Family Medicine

## 2007-08-16 ENCOUNTER — Emergency Department (HOSPITAL_COMMUNITY): Admission: EM | Admit: 2007-08-16 | Discharge: 2007-08-16 | Payer: Self-pay | Admitting: Emergency Medicine

## 2007-08-17 ENCOUNTER — Inpatient Hospital Stay (HOSPITAL_COMMUNITY): Admission: EM | Admit: 2007-08-17 | Discharge: 2007-08-20 | Payer: Self-pay | Admitting: Emergency Medicine

## 2007-08-17 ENCOUNTER — Telehealth (INDEPENDENT_AMBULATORY_CARE_PROVIDER_SITE_OTHER): Payer: Self-pay | Admitting: *Deleted

## 2007-08-17 ENCOUNTER — Ambulatory Visit: Payer: Self-pay | Admitting: Family Medicine

## 2007-08-18 ENCOUNTER — Encounter: Payer: Self-pay | Admitting: *Deleted

## 2007-08-18 HISTORY — PX: CARDIAC CATHETERIZATION: SHX172

## 2007-08-21 ENCOUNTER — Encounter: Payer: Self-pay | Admitting: *Deleted

## 2007-09-04 ENCOUNTER — Encounter (INDEPENDENT_AMBULATORY_CARE_PROVIDER_SITE_OTHER): Payer: Self-pay | Admitting: *Deleted

## 2007-09-20 ENCOUNTER — Encounter (INDEPENDENT_AMBULATORY_CARE_PROVIDER_SITE_OTHER): Payer: Self-pay | Admitting: *Deleted

## 2007-09-20 ENCOUNTER — Encounter: Payer: Self-pay | Admitting: *Deleted

## 2007-09-20 ENCOUNTER — Ambulatory Visit: Payer: Self-pay | Admitting: Family Medicine

## 2007-09-20 ENCOUNTER — Telehealth: Payer: Self-pay | Admitting: *Deleted

## 2007-09-20 LAB — CONVERTED CEMR LAB
Glucose, Urine, Semiquant: NEGATIVE
Ketones, urine, test strip: NEGATIVE
Nitrite: NEGATIVE
Specific Gravity, Urine: 1.015
WBC Urine, dipstick: NEGATIVE

## 2007-09-24 ENCOUNTER — Emergency Department (HOSPITAL_COMMUNITY): Admission: EM | Admit: 2007-09-24 | Discharge: 2007-09-24 | Payer: Self-pay | Admitting: Emergency Medicine

## 2007-09-27 ENCOUNTER — Telehealth: Payer: Self-pay | Admitting: *Deleted

## 2007-09-27 ENCOUNTER — Telehealth (INDEPENDENT_AMBULATORY_CARE_PROVIDER_SITE_OTHER): Payer: Self-pay | Admitting: *Deleted

## 2007-10-02 ENCOUNTER — Ambulatory Visit: Payer: Self-pay | Admitting: Internal Medicine

## 2007-10-02 ENCOUNTER — Encounter (INDEPENDENT_AMBULATORY_CARE_PROVIDER_SITE_OTHER): Payer: Self-pay | Admitting: Family Medicine

## 2007-10-02 ENCOUNTER — Telehealth: Payer: Self-pay | Admitting: *Deleted

## 2007-10-02 ENCOUNTER — Encounter: Payer: Self-pay | Admitting: *Deleted

## 2007-10-02 ENCOUNTER — Ambulatory Visit: Payer: Self-pay

## 2007-10-02 ENCOUNTER — Inpatient Hospital Stay (HOSPITAL_COMMUNITY): Admission: EM | Admit: 2007-10-02 | Discharge: 2007-10-05 | Payer: Self-pay | Admitting: Emergency Medicine

## 2007-10-02 LAB — CONVERTED CEMR LAB
CO2: 25 meq/L (ref 19–32)
Glucose, Bld: 110 mg/dL — ABNORMAL HIGH (ref 70–99)
Potassium: 5.1 meq/L (ref 3.5–5.3)
Sodium: 138 meq/L (ref 135–145)

## 2007-10-05 ENCOUNTER — Encounter: Payer: Self-pay | Admitting: Family Medicine

## 2007-10-10 ENCOUNTER — Ambulatory Visit: Payer: Self-pay | Admitting: Family Medicine

## 2007-10-10 ENCOUNTER — Encounter (INDEPENDENT_AMBULATORY_CARE_PROVIDER_SITE_OTHER): Payer: Self-pay | Admitting: *Deleted

## 2007-10-11 ENCOUNTER — Telehealth (INDEPENDENT_AMBULATORY_CARE_PROVIDER_SITE_OTHER): Payer: Self-pay | Admitting: *Deleted

## 2007-10-12 ENCOUNTER — Ambulatory Visit: Payer: Self-pay | Admitting: Family Medicine

## 2007-10-24 ENCOUNTER — Ambulatory Visit: Payer: Self-pay | Admitting: Family Medicine

## 2007-10-26 ENCOUNTER — Telehealth (INDEPENDENT_AMBULATORY_CARE_PROVIDER_SITE_OTHER): Payer: Self-pay | Admitting: *Deleted

## 2007-10-26 ENCOUNTER — Ambulatory Visit: Payer: Self-pay | Admitting: Family Medicine

## 2007-10-28 ENCOUNTER — Emergency Department (HOSPITAL_COMMUNITY): Admission: EM | Admit: 2007-10-28 | Discharge: 2007-10-28 | Payer: Self-pay | Admitting: Emergency Medicine

## 2007-11-09 ENCOUNTER — Encounter (INDEPENDENT_AMBULATORY_CARE_PROVIDER_SITE_OTHER): Payer: Self-pay | Admitting: *Deleted

## 2007-11-09 ENCOUNTER — Ambulatory Visit: Payer: Self-pay | Admitting: Family Medicine

## 2007-11-09 DIAGNOSIS — L84 Corns and callosities: Secondary | ICD-10-CM | POA: Insufficient documentation

## 2007-11-09 LAB — CONVERTED CEMR LAB
ALT: 8 units/L (ref 0–35)
AST: 13 units/L (ref 0–37)
Basophils Absolute: 0 10*3/uL (ref 0.0–0.1)
Basophils Relative: 0 % (ref 0–1)
CO2: 25 meq/L (ref 19–32)
Creatinine, Ser: 1.14 mg/dL (ref 0.40–1.20)
Eosinophils Relative: 3 % (ref 0–5)
HCT: 30.8 % — ABNORMAL LOW (ref 36.0–46.0)
Hemoglobin: 10.3 g/dL — ABNORMAL LOW (ref 12.0–15.0)
Lipase: 32 units/L (ref 0–75)
MCHC: 33.4 g/dL (ref 30.0–36.0)
MCV: 94.5 fL (ref 78.0–100.0)
Monocytes Absolute: 0.5 10*3/uL (ref 0.1–1.0)
RDW: 13.6 % (ref 11.5–15.5)
Total Bilirubin: 0.3 mg/dL (ref 0.3–1.2)

## 2007-11-10 ENCOUNTER — Encounter (INDEPENDENT_AMBULATORY_CARE_PROVIDER_SITE_OTHER): Payer: Self-pay | Admitting: *Deleted

## 2007-11-14 ENCOUNTER — Telehealth: Payer: Self-pay | Admitting: *Deleted

## 2007-11-14 ENCOUNTER — Emergency Department (HOSPITAL_COMMUNITY): Admission: EM | Admit: 2007-11-14 | Discharge: 2007-11-14 | Payer: Self-pay | Admitting: *Deleted

## 2007-11-14 ENCOUNTER — Telehealth (INDEPENDENT_AMBULATORY_CARE_PROVIDER_SITE_OTHER): Payer: Self-pay | Admitting: *Deleted

## 2007-11-14 ENCOUNTER — Ambulatory Visit: Payer: Self-pay | Admitting: Family Medicine

## 2007-11-14 ENCOUNTER — Encounter (INDEPENDENT_AMBULATORY_CARE_PROVIDER_SITE_OTHER): Payer: Self-pay | Admitting: *Deleted

## 2007-11-20 ENCOUNTER — Emergency Department (HOSPITAL_COMMUNITY): Admission: EM | Admit: 2007-11-20 | Discharge: 2007-11-21 | Payer: Self-pay | Admitting: Emergency Medicine

## 2007-11-22 ENCOUNTER — Ambulatory Visit: Payer: Self-pay | Admitting: Family Medicine

## 2007-11-22 ENCOUNTER — Inpatient Hospital Stay (HOSPITAL_COMMUNITY): Admission: AD | Admit: 2007-11-22 | Discharge: 2007-11-28 | Payer: Self-pay | Admitting: Family Medicine

## 2007-11-22 DIAGNOSIS — R1115 Cyclical vomiting syndrome unrelated to migraine: Secondary | ICD-10-CM

## 2007-12-04 ENCOUNTER — Ambulatory Visit: Payer: Self-pay | Admitting: Sports Medicine

## 2007-12-19 ENCOUNTER — Telehealth: Payer: Self-pay | Admitting: *Deleted

## 2007-12-19 ENCOUNTER — Ambulatory Visit: Payer: Self-pay | Admitting: Sports Medicine

## 2007-12-20 ENCOUNTER — Emergency Department (HOSPITAL_COMMUNITY): Admission: EM | Admit: 2007-12-20 | Discharge: 2007-12-20 | Payer: Self-pay | Admitting: Emergency Medicine

## 2007-12-25 ENCOUNTER — Ambulatory Visit: Payer: Self-pay | Admitting: Family Medicine

## 2007-12-26 ENCOUNTER — Ambulatory Visit: Payer: Self-pay | Admitting: Internal Medicine

## 2007-12-26 ENCOUNTER — Inpatient Hospital Stay (HOSPITAL_COMMUNITY): Admission: EM | Admit: 2007-12-26 | Discharge: 2007-12-29 | Payer: Self-pay | Admitting: Emergency Medicine

## 2007-12-26 ENCOUNTER — Encounter: Payer: Self-pay | Admitting: *Deleted

## 2008-01-02 ENCOUNTER — Ambulatory Visit: Payer: Self-pay | Admitting: Family Medicine

## 2008-01-04 ENCOUNTER — Encounter (INDEPENDENT_AMBULATORY_CARE_PROVIDER_SITE_OTHER): Payer: Self-pay | Admitting: *Deleted

## 2008-01-04 ENCOUNTER — Ambulatory Visit: Payer: Self-pay | Admitting: Family Medicine

## 2008-01-04 DIAGNOSIS — D649 Anemia, unspecified: Secondary | ICD-10-CM | POA: Insufficient documentation

## 2008-01-05 LAB — CONVERTED CEMR LAB
Basophils Absolute: 0 10*3/uL (ref 0.0–0.1)
Eosinophils Relative: 3 % (ref 0–5)
HCT: 31.3 % — ABNORMAL LOW (ref 36.0–46.0)
Hemoglobin: 10.2 g/dL — ABNORMAL LOW (ref 12.0–15.0)
Lymphocytes Relative: 40 % (ref 12–46)
Monocytes Absolute: 0.9 10*3/uL (ref 0.1–1.0)
Monocytes Relative: 12 % (ref 3–12)
RDW: 14.1 % (ref 11.5–15.5)

## 2008-01-10 ENCOUNTER — Encounter (INDEPENDENT_AMBULATORY_CARE_PROVIDER_SITE_OTHER): Payer: Self-pay | Admitting: *Deleted

## 2008-01-12 ENCOUNTER — Encounter (INDEPENDENT_AMBULATORY_CARE_PROVIDER_SITE_OTHER): Payer: Self-pay | Admitting: *Deleted

## 2008-01-26 ENCOUNTER — Telehealth (INDEPENDENT_AMBULATORY_CARE_PROVIDER_SITE_OTHER): Payer: Self-pay | Admitting: *Deleted

## 2008-02-02 ENCOUNTER — Encounter (INDEPENDENT_AMBULATORY_CARE_PROVIDER_SITE_OTHER): Payer: Self-pay | Admitting: *Deleted

## 2008-02-05 ENCOUNTER — Encounter: Payer: Self-pay | Admitting: Family Medicine

## 2008-02-05 ENCOUNTER — Ambulatory Visit: Payer: Self-pay | Admitting: Family Medicine

## 2008-02-07 ENCOUNTER — Ambulatory Visit: Payer: Self-pay | Admitting: Family Medicine

## 2008-02-09 ENCOUNTER — Encounter (INDEPENDENT_AMBULATORY_CARE_PROVIDER_SITE_OTHER): Payer: Self-pay | Admitting: *Deleted

## 2008-03-20 ENCOUNTER — Ambulatory Visit: Payer: Self-pay | Admitting: Family Medicine

## 2008-04-11 ENCOUNTER — Ambulatory Visit: Payer: Self-pay | Admitting: Family Medicine

## 2008-04-15 ENCOUNTER — Ambulatory Visit: Payer: Self-pay | Admitting: Family Medicine

## 2008-04-22 ENCOUNTER — Inpatient Hospital Stay (HOSPITAL_COMMUNITY): Admission: EM | Admit: 2008-04-22 | Discharge: 2008-04-25 | Payer: Self-pay | Admitting: Emergency Medicine

## 2008-04-22 ENCOUNTER — Ambulatory Visit: Payer: Self-pay | Admitting: Family Medicine

## 2008-04-22 ENCOUNTER — Encounter: Payer: Self-pay | Admitting: Family Medicine

## 2008-04-29 ENCOUNTER — Emergency Department (HOSPITAL_COMMUNITY): Admission: EM | Admit: 2008-04-29 | Discharge: 2008-04-29 | Payer: Self-pay | Admitting: Emergency Medicine

## 2008-05-03 ENCOUNTER — Emergency Department (HOSPITAL_COMMUNITY): Admission: EM | Admit: 2008-05-03 | Discharge: 2008-05-03 | Payer: Self-pay | Admitting: Emergency Medicine

## 2008-05-03 ENCOUNTER — Telehealth: Payer: Self-pay | Admitting: *Deleted

## 2008-05-06 ENCOUNTER — Encounter (INDEPENDENT_AMBULATORY_CARE_PROVIDER_SITE_OTHER): Payer: Self-pay | Admitting: *Deleted

## 2008-05-07 ENCOUNTER — Telehealth (INDEPENDENT_AMBULATORY_CARE_PROVIDER_SITE_OTHER): Payer: Self-pay | Admitting: *Deleted

## 2008-05-09 ENCOUNTER — Ambulatory Visit: Payer: Self-pay | Admitting: Family Medicine

## 2008-05-09 ENCOUNTER — Inpatient Hospital Stay (HOSPITAL_COMMUNITY): Admission: EM | Admit: 2008-05-09 | Discharge: 2008-05-12 | Payer: Self-pay | Admitting: Emergency Medicine

## 2008-05-09 ENCOUNTER — Encounter: Payer: Self-pay | Admitting: Family Medicine

## 2008-05-09 DIAGNOSIS — K859 Acute pancreatitis without necrosis or infection, unspecified: Secondary | ICD-10-CM

## 2008-05-09 HISTORY — DX: Acute pancreatitis without necrosis or infection, unspecified: K85.90

## 2008-05-24 ENCOUNTER — Telehealth: Payer: Self-pay | Admitting: *Deleted

## 2008-05-27 ENCOUNTER — Ambulatory Visit: Payer: Self-pay | Admitting: Family Medicine

## 2008-05-27 ENCOUNTER — Encounter (INDEPENDENT_AMBULATORY_CARE_PROVIDER_SITE_OTHER): Payer: Self-pay | Admitting: *Deleted

## 2008-05-27 LAB — CONVERTED CEMR LAB
Chlamydia, DNA Probe: NEGATIVE
GC Probe Amp, Genital: NEGATIVE

## 2008-05-29 ENCOUNTER — Encounter (INDEPENDENT_AMBULATORY_CARE_PROVIDER_SITE_OTHER): Payer: Self-pay | Admitting: *Deleted

## 2008-06-05 ENCOUNTER — Encounter (INDEPENDENT_AMBULATORY_CARE_PROVIDER_SITE_OTHER): Payer: Self-pay | Admitting: *Deleted

## 2008-06-11 ENCOUNTER — Ambulatory Visit: Payer: Self-pay | Admitting: Family Medicine

## 2008-06-11 ENCOUNTER — Encounter (INDEPENDENT_AMBULATORY_CARE_PROVIDER_SITE_OTHER): Payer: Self-pay | Admitting: *Deleted

## 2008-06-12 ENCOUNTER — Telehealth (INDEPENDENT_AMBULATORY_CARE_PROVIDER_SITE_OTHER): Payer: Self-pay | Admitting: *Deleted

## 2008-06-12 ENCOUNTER — Telehealth: Payer: Self-pay | Admitting: *Deleted

## 2008-07-04 ENCOUNTER — Ambulatory Visit: Payer: Self-pay | Admitting: Family Medicine

## 2008-07-07 ENCOUNTER — Emergency Department (HOSPITAL_COMMUNITY): Admission: EM | Admit: 2008-07-07 | Discharge: 2008-07-08 | Payer: Self-pay | Admitting: Emergency Medicine

## 2008-07-08 ENCOUNTER — Encounter (INDEPENDENT_AMBULATORY_CARE_PROVIDER_SITE_OTHER): Payer: Self-pay | Admitting: *Deleted

## 2008-07-09 ENCOUNTER — Observation Stay (HOSPITAL_COMMUNITY): Admission: EM | Admit: 2008-07-09 | Discharge: 2008-07-12 | Payer: Self-pay | Admitting: Emergency Medicine

## 2008-07-09 ENCOUNTER — Ambulatory Visit: Payer: Self-pay | Admitting: Family Medicine

## 2008-07-09 ENCOUNTER — Encounter: Payer: Self-pay | Admitting: Family Medicine

## 2008-08-09 ENCOUNTER — Ambulatory Visit: Payer: Self-pay | Admitting: Family Medicine

## 2008-08-09 LAB — CONVERTED CEMR LAB: Hgb A1c MFr Bld: 8.7 %

## 2008-08-20 ENCOUNTER — Telehealth (INDEPENDENT_AMBULATORY_CARE_PROVIDER_SITE_OTHER): Payer: Self-pay | Admitting: *Deleted

## 2008-09-06 ENCOUNTER — Encounter: Payer: Self-pay | Admitting: Family Medicine

## 2008-09-06 ENCOUNTER — Emergency Department (HOSPITAL_COMMUNITY): Admission: EM | Admit: 2008-09-06 | Discharge: 2008-09-06 | Payer: Self-pay | Admitting: Emergency Medicine

## 2008-09-10 ENCOUNTER — Encounter: Payer: Self-pay | Admitting: *Deleted

## 2008-09-20 ENCOUNTER — Telehealth: Payer: Self-pay | Admitting: *Deleted

## 2008-09-20 ENCOUNTER — Ambulatory Visit: Payer: Self-pay | Admitting: Family Medicine

## 2008-09-25 ENCOUNTER — Ambulatory Visit: Payer: Self-pay | Admitting: Family Medicine

## 2008-11-14 ENCOUNTER — Telehealth (INDEPENDENT_AMBULATORY_CARE_PROVIDER_SITE_OTHER): Payer: Self-pay | Admitting: Family Medicine

## 2008-12-11 ENCOUNTER — Telehealth: Payer: Self-pay | Admitting: *Deleted

## 2008-12-11 ENCOUNTER — Encounter: Payer: Self-pay | Admitting: Family Medicine

## 2008-12-11 ENCOUNTER — Ambulatory Visit: Payer: Self-pay | Admitting: Family Medicine

## 2008-12-12 ENCOUNTER — Emergency Department (HOSPITAL_COMMUNITY): Admission: EM | Admit: 2008-12-12 | Discharge: 2008-12-12 | Payer: Self-pay | Admitting: Emergency Medicine

## 2008-12-12 ENCOUNTER — Encounter (INDEPENDENT_AMBULATORY_CARE_PROVIDER_SITE_OTHER): Payer: Self-pay | Admitting: *Deleted

## 2008-12-12 ENCOUNTER — Telehealth: Payer: Self-pay | Admitting: *Deleted

## 2008-12-17 ENCOUNTER — Telehealth: Payer: Self-pay | Admitting: *Deleted

## 2008-12-18 ENCOUNTER — Encounter: Payer: Self-pay | Admitting: Family Medicine

## 2008-12-18 ENCOUNTER — Ambulatory Visit: Payer: Self-pay | Admitting: Family Medicine

## 2008-12-18 LAB — CONVERTED CEMR LAB
AST: 8 units/L (ref 0–37)
Alkaline Phosphatase: 64 units/L (ref 39–117)
BUN: 16 mg/dL (ref 6–23)
Creatinine, Ser: 1.1 mg/dL (ref 0.40–1.20)
Potassium: 4.3 meq/L (ref 3.5–5.3)
Total Bilirubin: 0.4 mg/dL (ref 0.3–1.2)

## 2008-12-30 ENCOUNTER — Telehealth (INDEPENDENT_AMBULATORY_CARE_PROVIDER_SITE_OTHER): Payer: Self-pay | Admitting: Family Medicine

## 2008-12-31 ENCOUNTER — Telehealth: Payer: Self-pay | Admitting: *Deleted

## 2009-01-03 ENCOUNTER — Encounter (INDEPENDENT_AMBULATORY_CARE_PROVIDER_SITE_OTHER): Payer: Self-pay | Admitting: Family Medicine

## 2009-01-03 ENCOUNTER — Ambulatory Visit: Payer: Self-pay | Admitting: Family Medicine

## 2009-01-03 LAB — CONVERTED CEMR LAB
Cholesterol: 156 mg/dL (ref 0–200)
Total CHOL/HDL Ratio: 2.8
Triglycerides: 62 mg/dL (ref <150)
VLDL: 12 mg/dL (ref 0–40)

## 2009-01-06 ENCOUNTER — Encounter (INDEPENDENT_AMBULATORY_CARE_PROVIDER_SITE_OTHER): Payer: Self-pay | Admitting: Family Medicine

## 2009-01-15 ENCOUNTER — Ambulatory Visit: Payer: Self-pay | Admitting: Family Medicine

## 2009-01-16 ENCOUNTER — Ambulatory Visit: Payer: Self-pay | Admitting: Family Medicine

## 2009-01-16 ENCOUNTER — Encounter (INDEPENDENT_AMBULATORY_CARE_PROVIDER_SITE_OTHER): Payer: Self-pay | Admitting: Family Medicine

## 2009-01-24 ENCOUNTER — Encounter: Payer: Self-pay | Admitting: Family Medicine

## 2009-01-24 ENCOUNTER — Inpatient Hospital Stay (HOSPITAL_COMMUNITY): Admission: EM | Admit: 2009-01-24 | Discharge: 2009-01-27 | Payer: Self-pay | Admitting: Emergency Medicine

## 2009-01-24 ENCOUNTER — Ambulatory Visit: Payer: Self-pay | Admitting: Family Medicine

## 2009-01-30 ENCOUNTER — Telehealth: Payer: Self-pay | Admitting: *Deleted

## 2009-02-07 ENCOUNTER — Ambulatory Visit: Payer: Self-pay | Admitting: Family Medicine

## 2009-02-10 ENCOUNTER — Ambulatory Visit: Payer: Self-pay | Admitting: Family Medicine

## 2009-02-10 ENCOUNTER — Encounter: Payer: Self-pay | Admitting: *Deleted

## 2009-02-10 ENCOUNTER — Telehealth (INDEPENDENT_AMBULATORY_CARE_PROVIDER_SITE_OTHER): Payer: Self-pay | Admitting: Family Medicine

## 2009-02-10 ENCOUNTER — Inpatient Hospital Stay (HOSPITAL_COMMUNITY): Admission: AD | Admit: 2009-02-10 | Discharge: 2009-02-12 | Payer: Self-pay | Admitting: Family Medicine

## 2009-02-21 ENCOUNTER — Ambulatory Visit: Payer: Self-pay | Admitting: Family Medicine

## 2009-03-29 ENCOUNTER — Emergency Department (HOSPITAL_COMMUNITY): Admission: EM | Admit: 2009-03-29 | Discharge: 2009-03-29 | Payer: Self-pay | Admitting: Emergency Medicine

## 2009-04-03 ENCOUNTER — Other Ambulatory Visit: Payer: Self-pay | Admitting: Emergency Medicine

## 2009-04-03 ENCOUNTER — Ambulatory Visit: Payer: Self-pay | Admitting: Family Medicine

## 2009-04-03 ENCOUNTER — Inpatient Hospital Stay (HOSPITAL_COMMUNITY): Admission: AD | Admit: 2009-04-03 | Discharge: 2009-04-06 | Payer: Self-pay | Admitting: Family Medicine

## 2009-04-07 ENCOUNTER — Encounter (INDEPENDENT_AMBULATORY_CARE_PROVIDER_SITE_OTHER): Payer: Self-pay | Admitting: Family Medicine

## 2009-04-07 ENCOUNTER — Encounter: Payer: Self-pay | Admitting: Internal Medicine

## 2009-04-17 ENCOUNTER — Telehealth (INDEPENDENT_AMBULATORY_CARE_PROVIDER_SITE_OTHER): Payer: Self-pay | Admitting: Family Medicine

## 2009-04-17 ENCOUNTER — Emergency Department (HOSPITAL_COMMUNITY): Admission: EM | Admit: 2009-04-17 | Discharge: 2009-04-17 | Payer: Self-pay | Admitting: Emergency Medicine

## 2009-04-17 ENCOUNTER — Encounter (INDEPENDENT_AMBULATORY_CARE_PROVIDER_SITE_OTHER): Payer: Self-pay | Admitting: *Deleted

## 2009-05-12 ENCOUNTER — Ambulatory Visit: Payer: Self-pay | Admitting: Internal Medicine

## 2009-06-02 ENCOUNTER — Encounter: Payer: Self-pay | Admitting: Internal Medicine

## 2009-06-02 ENCOUNTER — Encounter (INDEPENDENT_AMBULATORY_CARE_PROVIDER_SITE_OTHER): Payer: Self-pay | Admitting: Family Medicine

## 2009-06-05 ENCOUNTER — Ambulatory Visit: Payer: Self-pay | Admitting: Family Medicine

## 2009-06-06 ENCOUNTER — Telehealth: Payer: Self-pay | Admitting: *Deleted

## 2009-06-17 ENCOUNTER — Encounter (INDEPENDENT_AMBULATORY_CARE_PROVIDER_SITE_OTHER): Payer: Self-pay | Admitting: Family Medicine

## 2009-06-17 ENCOUNTER — Encounter: Admission: RE | Admit: 2009-06-17 | Discharge: 2009-06-17 | Payer: Self-pay | Admitting: Family Medicine

## 2009-06-19 ENCOUNTER — Telehealth: Payer: Self-pay | Admitting: *Deleted

## 2009-06-20 ENCOUNTER — Encounter (INDEPENDENT_AMBULATORY_CARE_PROVIDER_SITE_OTHER): Payer: Self-pay | Admitting: Family Medicine

## 2009-06-20 ENCOUNTER — Encounter: Payer: Self-pay | Admitting: *Deleted

## 2009-06-20 ENCOUNTER — Encounter: Admission: RE | Admit: 2009-06-20 | Discharge: 2009-06-20 | Payer: Self-pay | Admitting: Family Medicine

## 2009-06-24 ENCOUNTER — Encounter (INDEPENDENT_AMBULATORY_CARE_PROVIDER_SITE_OTHER): Payer: Self-pay | Admitting: Family Medicine

## 2009-07-02 ENCOUNTER — Encounter: Admission: RE | Admit: 2009-07-02 | Discharge: 2009-07-02 | Payer: Self-pay | Admitting: Family Medicine

## 2009-07-02 ENCOUNTER — Ambulatory Visit: Payer: Self-pay | Admitting: Family Medicine

## 2009-07-02 ENCOUNTER — Telehealth: Payer: Self-pay | Admitting: Family Medicine

## 2009-07-23 ENCOUNTER — Encounter: Payer: Self-pay | Admitting: Family Medicine

## 2009-07-23 ENCOUNTER — Inpatient Hospital Stay (HOSPITAL_COMMUNITY): Admission: EM | Admit: 2009-07-23 | Discharge: 2009-07-25 | Payer: Self-pay | Admitting: Emergency Medicine

## 2009-07-23 ENCOUNTER — Ambulatory Visit: Payer: Self-pay | Admitting: Family Medicine

## 2009-07-28 ENCOUNTER — Ambulatory Visit: Payer: Self-pay | Admitting: Family Medicine

## 2009-08-14 ENCOUNTER — Ambulatory Visit: Payer: Self-pay | Admitting: Family Medicine

## 2009-09-12 ENCOUNTER — Telehealth: Payer: Self-pay | Admitting: Family Medicine

## 2009-09-12 ENCOUNTER — Encounter: Payer: Self-pay | Admitting: Family Medicine

## 2009-09-12 ENCOUNTER — Ambulatory Visit: Payer: Self-pay | Admitting: Family Medicine

## 2009-09-12 ENCOUNTER — Telehealth: Payer: Self-pay | Admitting: *Deleted

## 2009-09-12 LAB — CONVERTED CEMR LAB
BUN: 19 mg/dL (ref 6–23)
CO2: 26 meq/L (ref 19–32)
Calcium: 9.3 mg/dL (ref 8.4–10.5)
Glucose, Bld: 226 mg/dL — ABNORMAL HIGH (ref 70–99)

## 2009-09-15 ENCOUNTER — Telehealth: Payer: Self-pay | Admitting: Family Medicine

## 2009-10-02 ENCOUNTER — Telehealth: Payer: Self-pay | Admitting: Family Medicine

## 2009-10-29 ENCOUNTER — Encounter: Payer: Self-pay | Admitting: Family Medicine

## 2009-10-29 ENCOUNTER — Inpatient Hospital Stay (HOSPITAL_COMMUNITY): Admission: EM | Admit: 2009-10-29 | Discharge: 2009-10-31 | Payer: Self-pay | Admitting: Emergency Medicine

## 2009-10-29 ENCOUNTER — Ambulatory Visit: Payer: Self-pay | Admitting: Family Medicine

## 2009-10-31 ENCOUNTER — Encounter: Payer: Self-pay | Admitting: Family Medicine

## 2009-11-10 ENCOUNTER — Telehealth: Payer: Self-pay | Admitting: Family Medicine

## 2009-11-17 ENCOUNTER — Ambulatory Visit: Payer: Self-pay | Admitting: Family Medicine

## 2009-11-17 LAB — CONVERTED CEMR LAB: Hgb A1c MFr Bld: 9.2 %

## 2010-01-12 ENCOUNTER — Telehealth: Payer: Self-pay | Admitting: Family Medicine

## 2010-01-12 ENCOUNTER — Emergency Department (HOSPITAL_COMMUNITY): Admission: EM | Admit: 2010-01-12 | Discharge: 2010-01-12 | Payer: Self-pay | Admitting: Emergency Medicine

## 2010-01-14 ENCOUNTER — Emergency Department (HOSPITAL_COMMUNITY): Admission: EM | Admit: 2010-01-14 | Discharge: 2010-01-15 | Payer: Self-pay | Admitting: Emergency Medicine

## 2010-01-15 ENCOUNTER — Telehealth: Payer: Self-pay | Admitting: Family Medicine

## 2010-01-15 ENCOUNTER — Ambulatory Visit: Payer: Self-pay | Admitting: Family Medicine

## 2010-01-19 ENCOUNTER — Encounter: Payer: Self-pay | Admitting: Family Medicine

## 2010-01-23 ENCOUNTER — Ambulatory Visit: Payer: Self-pay | Admitting: Family Medicine

## 2010-01-29 ENCOUNTER — Telehealth: Payer: Self-pay | Admitting: Family Medicine

## 2010-02-10 ENCOUNTER — Emergency Department (HOSPITAL_COMMUNITY): Admission: EM | Admit: 2010-02-10 | Discharge: 2010-02-10 | Payer: Self-pay | Admitting: Emergency Medicine

## 2010-02-10 ENCOUNTER — Telehealth: Payer: Self-pay | Admitting: Family Medicine

## 2010-02-11 ENCOUNTER — Ambulatory Visit: Payer: Self-pay | Admitting: Family Medicine

## 2010-02-11 ENCOUNTER — Telehealth: Payer: Self-pay | Admitting: Family Medicine

## 2010-03-12 ENCOUNTER — Encounter: Payer: Self-pay | Admitting: Family Medicine

## 2010-03-12 ENCOUNTER — Inpatient Hospital Stay (HOSPITAL_COMMUNITY): Admission: EM | Admit: 2010-03-12 | Discharge: 2010-03-13 | Payer: Self-pay | Admitting: Emergency Medicine

## 2010-03-12 ENCOUNTER — Ambulatory Visit: Payer: Self-pay | Admitting: Family Medicine

## 2010-03-12 LAB — CONVERTED CEMR LAB: Creatinine, Ser: 1.61 mg/dL

## 2010-03-13 LAB — CONVERTED CEMR LAB: Hgb A1c MFr Bld: 10.4 %

## 2010-03-19 ENCOUNTER — Ambulatory Visit: Payer: Self-pay | Admitting: Family Medicine

## 2010-03-19 DIAGNOSIS — L293 Anogenital pruritus, unspecified: Secondary | ICD-10-CM

## 2010-03-19 LAB — CONVERTED CEMR LAB
Nitrite: NEGATIVE
Specific Gravity, Urine: 1.01
Urobilinogen, UA: 0.2

## 2010-03-31 ENCOUNTER — Encounter: Payer: Self-pay | Admitting: Family Medicine

## 2010-03-31 ENCOUNTER — Ambulatory Visit: Payer: Self-pay | Admitting: Family Medicine

## 2010-04-04 ENCOUNTER — Emergency Department (HOSPITAL_COMMUNITY): Admission: EM | Admit: 2010-04-04 | Discharge: 2010-04-04 | Payer: Self-pay | Admitting: Emergency Medicine

## 2010-04-06 ENCOUNTER — Inpatient Hospital Stay (HOSPITAL_COMMUNITY): Admission: EM | Admit: 2010-04-06 | Discharge: 2010-04-12 | Payer: Self-pay | Admitting: Emergency Medicine

## 2010-04-06 ENCOUNTER — Telehealth: Payer: Self-pay | Admitting: Family Medicine

## 2010-04-06 ENCOUNTER — Ambulatory Visit: Payer: Self-pay | Admitting: Family Medicine

## 2010-04-06 DIAGNOSIS — R109 Unspecified abdominal pain: Secondary | ICD-10-CM | POA: Insufficient documentation

## 2010-04-21 ENCOUNTER — Ambulatory Visit: Payer: Self-pay | Admitting: Family Medicine

## 2010-04-22 ENCOUNTER — Telehealth: Payer: Self-pay | Admitting: *Deleted

## 2010-06-01 ENCOUNTER — Encounter: Payer: Self-pay | Admitting: Family Medicine

## 2010-06-09 ENCOUNTER — Ambulatory Visit: Payer: Self-pay | Admitting: Family Medicine

## 2010-08-21 ENCOUNTER — Emergency Department (HOSPITAL_COMMUNITY): Admission: EM | Admit: 2010-08-21 | Discharge: 2010-08-21 | Payer: Self-pay | Admitting: Emergency Medicine

## 2010-08-25 ENCOUNTER — Ambulatory Visit: Payer: Self-pay | Admitting: Family Medicine

## 2010-08-25 LAB — CONVERTED CEMR LAB: Hgb A1c MFr Bld: 9.3 %

## 2010-09-29 ENCOUNTER — Telehealth: Payer: Self-pay | Admitting: Family Medicine

## 2010-09-29 ENCOUNTER — Emergency Department (HOSPITAL_COMMUNITY): Admission: EM | Admit: 2010-09-29 | Discharge: 2010-09-29 | Payer: Self-pay | Admitting: Family Medicine

## 2010-09-29 ENCOUNTER — Emergency Department (HOSPITAL_COMMUNITY): Admission: EM | Admit: 2010-09-29 | Discharge: 2010-09-30 | Payer: Self-pay | Admitting: Emergency Medicine

## 2010-09-30 ENCOUNTER — Ambulatory Visit: Payer: Self-pay | Admitting: Family Medicine

## 2010-10-01 ENCOUNTER — Encounter: Payer: Self-pay | Admitting: Family Medicine

## 2010-10-02 ENCOUNTER — Other Ambulatory Visit: Payer: Self-pay | Admitting: Emergency Medicine

## 2010-10-02 ENCOUNTER — Encounter: Payer: Self-pay | Admitting: Family Medicine

## 2010-10-02 ENCOUNTER — Inpatient Hospital Stay (HOSPITAL_COMMUNITY): Admission: EM | Admit: 2010-10-02 | Discharge: 2010-10-05 | Payer: Self-pay | Admitting: Family Medicine

## 2010-10-02 ENCOUNTER — Ambulatory Visit: Payer: Self-pay | Admitting: Family Medicine

## 2010-10-14 ENCOUNTER — Telehealth: Payer: Self-pay | Admitting: Family Medicine

## 2010-10-19 ENCOUNTER — Ambulatory Visit: Payer: Self-pay | Admitting: Family Medicine

## 2010-10-19 ENCOUNTER — Encounter: Payer: Self-pay | Admitting: Family Medicine

## 2010-10-19 DIAGNOSIS — N760 Acute vaginitis: Secondary | ICD-10-CM | POA: Insufficient documentation

## 2010-10-19 LAB — CONVERTED CEMR LAB
Bilirubin Urine: NEGATIVE
Protein, U semiquant: 100
Urobilinogen, UA: 0.2

## 2010-10-21 LAB — CONVERTED CEMR LAB
Chlamydia, Swab/Urine, PCR: NEGATIVE
GC Probe Amp, Urine: NEGATIVE

## 2010-10-28 ENCOUNTER — Encounter (INDEPENDENT_AMBULATORY_CARE_PROVIDER_SITE_OTHER): Payer: Self-pay | Admitting: Pharmacist

## 2010-12-16 ENCOUNTER — Ambulatory Visit: Payer: Self-pay | Admitting: Family Medicine

## 2010-12-16 LAB — CONVERTED CEMR LAB: Hgb A1c MFr Bld: 10.2 %

## 2010-12-27 ENCOUNTER — Emergency Department (HOSPITAL_COMMUNITY)
Admission: EM | Admit: 2010-12-27 | Discharge: 2010-12-27 | Payer: Self-pay | Source: Home / Self Care | Admitting: Emergency Medicine

## 2010-12-29 ENCOUNTER — Ambulatory Visit: Admit: 2010-12-29 | Payer: Self-pay

## 2011-01-01 ENCOUNTER — Encounter: Payer: Self-pay | Admitting: Family Medicine

## 2011-01-01 ENCOUNTER — Ambulatory Visit: Admission: RE | Admit: 2011-01-01 | Discharge: 2011-01-01 | Payer: Self-pay | Source: Home / Self Care

## 2011-01-01 DIAGNOSIS — M62838 Other muscle spasm: Secondary | ICD-10-CM | POA: Insufficient documentation

## 2011-01-01 LAB — CONVERTED CEMR LAB
BUN: 25 mg/dL — ABNORMAL HIGH (ref 6–23)
CO2: 21 meq/L (ref 19–32)
Chloride: 106 meq/L (ref 96–112)
Creatinine, Ser: 1.49 mg/dL — ABNORMAL HIGH (ref 0.40–1.20)
Potassium: 4.7 meq/L (ref 3.5–5.3)

## 2011-01-07 ENCOUNTER — Ambulatory Visit: Admit: 2011-01-07 | Payer: Self-pay

## 2011-01-08 ENCOUNTER — Telehealth: Payer: Self-pay | Admitting: Family Medicine

## 2011-01-18 ENCOUNTER — Encounter: Payer: Self-pay | Admitting: Family Medicine

## 2011-01-26 NOTE — Progress Notes (Signed)
Summary: triage   Phone Note Call from Patient Call back at Home Phone [phone]   Caller: Patient Summary of Call: Went to ed last night with vomiting.  Still feels awful today with fever and cough.  Not sure what to do.  Follow-up for Phone Call        states she went to ED last night & they gave her something for the vomiting. she has a rx for tusssionex but cannot afford it. medicaid will not pay for it. work in with pcp at 1:30. aware there may be a wait Follow-up by: Elige Radon RN,  February 11, 2010 11:17 AM

## 2011-01-26 NOTE — Letter (Signed)
Summary: CONTROLLED SUBSTANCE CONTRACT  CONTROLLED SUBSTANCE CONTRACT   Imported By: Britt Boozer PATE CMA, 02/09/2008 13:52:46  _____________________________________________________________________  External Attachment:    Type:   Image     Comment:   External Document

## 2011-01-26 NOTE — Letter (Signed)
Summary: Generic Letter  [facility]  [address]   [place], [place] [address]   Phone: [phone]  Fax: [phone]    01/19/2010  [name] [name] [address] [place], [place]  [address]  To whom it may concern:  [name] has been my patient in the primary care setting since 07/28/2009. She has a long standing history of cyclic vomiting syndrome, and as a result suffers intermittent episodes of severe nausea and vomiting, at times requiring hospitalization for intravenous medications and rehydration. This has been a chronic and debilitating condition for her; she has been unable to work consistently secondary to the unpredictability and severity of her disease. This condition also makes the management of her other chronic conditions, namely diabetes mellitus type 2 and hypertension, more difficult in that she requires closer follow up than the average patient with the same comorbidities given her episodes of severe emesis and dehydration. The patient also suffers from chronic lower back pain, which, again because of her condition, is difficult to adequately treat. Please let me know if any other information regarding this case is required, and please feel free to direct any questions regarding this case to the phone number and/or address above.   Thank you for your consideration of this matter.     Sincerely,   Mariana Arn  MD

## 2011-01-26 NOTE — Progress Notes (Signed)
Summary: refill   Phone Note Refill Request Call back at Home Phone [phone] Message from:  Patient  Refills Requested: Medication #1:  FLEXERIL 5 MG TABS one tab by mouth three times a day [facility] [place] needs asap  Initial call taken by: Audie Clear,  October 14, 2010 2:44 PM    Prescriptions: FLEXERIL 5 MG TABS (CYCLOBENZAPRINE HCL) one tab by mouth three times a day  #30 x 0   Entered and Authorized by:   Mariana Arn  MD   Signed by:   Mariana Arn  MD on 10/15/2010   Method used:   Faxed to ...       [facility] (mail-order)       [address] [place], [place]  [address]       Ph: [phone]       Fax: [phone]   RxID:   [id number]  Please let know that script is sent to pharmacy. Thanks! Tenna Child  MD  October 15, 2010 2:57 PM

## 2011-01-26 NOTE — Assessment & Plan Note (Signed)
Summary: n&v,pain on side/[place]/carew   Vital Signs:  Patient profile:   45 year old female Height:      61.5 inches Weight:      179 pounds BMI:     33.39 Temp:     97.8 degrees F oral Pulse rate:   71 / minute BP sitting:   125 / 78  (left arm) Cuff size:   large  Vitals Entered By: Enid Skeens, CMA (September 30, 2010 8:57 AM) CC: abd pain, nausea Is Patient Diabetic? Yes Did you bring your meter with you today? Yes Pain Assessment Patient in pain? yes     Location: left side Intensity: 10 Type: dull Comments pain is increeasing in frequency,   Primary Care Provider:  Mariana Arn  MD  CC:  abd pain and nausea.  History of Present Illness: 45 y/o F here for Abd pain and nausea x 4 days.  Pain located in LUQ and epigastric area.  She was seen in ER yesterday.  At that time ABX showed no SBO, but did show lots of feces in colon.   Last BM Monday and Tues, but very hard, pt had to strain.  She also has history of GERD, HTN, DM, Pancreatitis.   Symptoms Nausea/Vomiting: Vomited x 3 Mon, x2 Tues Diarrhea: no Constipation: yes Melena/BRBPR: no Hematemesis: no Anorexia: no, was in ER all day, vomiting so she has not eaten since yesterday Fever/Chills: no Jaundice: no Dysuria: no Back pain:yes, but chronic issue Rash: no Weight loss: no Vaginal bleeding: no STD exposure: no LMP: 8 yrs ago s/p hysterectomy  Alcohol use: no  NSAID use: no  PMH Past Surgeries: SBO surgery     Habits & Providers  Alcohol-Tobacco-Diet     Tobacco Status: current  Current Medications (verified): 1)  Bayer Childrens Aspirin 81 Mg Chew (Aspirin) .... Take 1 Tablet By Mouth Once A Day 2)  Lorazepam 1 Mg Tabs (Lorazepam) .... Take 1 Tablet Every 6 Hours As Needed For Nausea 3)  Promethazine Hcl 25 Mg  Tabs (Promethazine Hcl) .... Take 1 Tablet Every 4 Hours At First Sign of Nausea 4)  Lantus 100 Unit/ml Soln (Insulin Glargine) .... Take 22 Units Subcutaneously Each Morning. Increase By  One Unit A Day For Each Fasting Blood Glucose Over 110. Dispense One Month Supply. 5)  Miralax   Powd (Polyethylene Glycol 3350) .... Mix Packet With Water Once Daily As Needed For Constipation 6)  Phenadoz 25 Mg  Supp (Promethazine Hcl) .... Insert 1 Every 6 Hours As Needed 7)  Promethazine Hcl 25 Mg/ml  Soln (Promethazine Hcl) .... Inject 25 Mg ( 1 Ml) Im Q 6 Hours Prn 8)  Im Injection Supplies .... For Use With Phenergan Injections Qs For Thirty Injections 9)  Metformin Hcl 1000 Mg  Tabs (Metformin Hcl) .Marland Kitchen.. 1 Tab By Mouth Two Times A Day For Diabetes 10)  Hydrocodone-Acetaminophen 5-500 Mg Tabs (Hydrocodone-Acetaminophen) .Marland Kitchen.. 1-2 Tabs By Mouth Every 6 Hours 11)  Coreg 25 Mg Tabs (Carvedilol) .Marland Kitchen.. 1 Tablet By Mouth Two Times A Day 12)  Prilosec 20 Mg Cpdr (Omeprazole) .Marland Kitchen.. 1 Tablet By Mouth Daily 13)  Mirtazapine 30 Mg Tabs (Mirtazapine) .... One Tab By Mouth At Bedtime 14)  Diabetic Shoes 15)  Flexeril 5 Mg Tabs (Cyclobenzaprine Hcl) .... One Tab By Mouth Three Times A Day 16)  Amlodipine Besylate 10 Mg Tabs (Amlodipine Besylate) .... One Tab Po Qday 17)  Epipen 0.3 Mg/0.21ml Devi (Epinephrine) .... Give One Dose Im As Needed  For Severe Anaphylaxis (See Instructions On Use) 18)  Colace 100 Mg Caps (Docusate Sodium) .... One Tab By Mouth Two Times A Day As Needed For Constipation  Allergies: 1)  ! Percocet 2)  ! Erythromycin  Review of Systems       per hpi   Physical Exam  General:  Well-developed,well-nourished,in no acute distress; alert,appropriate and cooperative throughout examination. vitals reviewed.   Abdomen:  soft, no distention, no masses, bowel sounds hyperactive, guarding, epigastric tenderness, and LUQ tenderness.   Extremities:  No clubbing, cyanosis, edema, or deformity noted with normal full range of motion of all joints.     Impression & Recommendations:  Problem # 1:  ABDOMINAL PAIN (ICD-789.00) Assessment New Pt was seen in ER yesterday and came straight  here from ER.  Review of labs/studies showed: CBC wnl, Lipase normal, Cmet wnl (elevated Cr 1.5 and glucose 200s), UA without indication of infection.  ABX showed constipation, no air-fluid level.  Abd exam showed nonsurgical abd.  Likely constipation is causing abd pain-->nausea.  ER gave Rx for Zofran and Lactulose two times a day.  Pt states that Zofran does not usually work for her but that she has a lot of phenergan at home.  Advised taking phenergan as needed nausea and to start Lactulose today per ER instructions.  Pt to rtc in 1 wk if symptoms persist.  Red flags given for rtc/er.    Orders: [place]- Est Level  3 [id number]) Promethazine up to 50mg  (J2550)  Complete Medication List: 1)  Bayer Childrens Aspirin 81 Mg Chew (Aspirin) .... Take 1 tablet by mouth once a day 2)  Lorazepam 1 Mg Tabs (Lorazepam) .... Take 1 tablet every 6 hours as needed for nausea 3)  Promethazine Hcl 25 Mg Tabs (Promethazine hcl) .... Take 1 tablet every 4 hours at first sign of nausea 4)  Lantus 100 Unit/ml Soln (Insulin glargine) .... Take 22 units subcutaneously each morning. increase by one unit a day for each fasting blood glucose over 110. dispense one month supply. 5)  Miralax Powd (Polyethylene glycol 3350) .... Mix packet with water once daily as needed for constipation 6)  Phenadoz 25 Mg Supp (Promethazine hcl) .... Insert 1 every 6 hours as needed 7)  Promethazine Hcl 25 Mg/ml Soln (Promethazine hcl) .... Inject 25 mg ( 1 ml) im q 6 hours prn 8)  Im Injection Supplies  .... For use with phenergan injections qs for thirty injections 9)  Metformin Hcl 1000 Mg Tabs (Metformin hcl) .Marland Kitchen.. 1 tab by mouth two times a day for diabetes 10)  Hydrocodone-acetaminophen 5-500 Mg Tabs (Hydrocodone-acetaminophen) .Marland Kitchen.. 1-2 tabs by mouth every 6 hours 11)  Coreg 25 Mg Tabs (Carvedilol) .Marland Kitchen.. 1 tablet by mouth two times a day 12)  Prilosec 20 Mg Cpdr (Omeprazole) .Marland Kitchen.. 1 tablet by mouth daily 13)  Mirtazapine 30 Mg Tabs  (Mirtazapine) .... One tab by mouth at bedtime 14)  Diabetic Shoes  15)  Flexeril 5 Mg Tabs (Cyclobenzaprine hcl) .... One tab by mouth three times a day 16)  Amlodipine Besylate 10 Mg Tabs (Amlodipine besylate) .... One tab po qday 17)  Epipen 0.3 Mg/0.81ml Devi (Epinephrine) .... Give one dose im as needed for severe anaphylaxis (see instructions on use) 18)  Colace 100 Mg Caps (Docusate sodium) .... One tab by mouth two times a day as needed for constipation  Patient Instructions: 1)  Please schedule a follow-up appointment in 1 week if still feeling bad, with stomach pain and nuasea.  2)  You are constipated and having Bowel movements will help with pain and nausea.  Take the lactulose two times a day as directed by ER.   3)  Take the phenergan you have at home for nausea.   4)  Call us if you have fever, worsening vomiting, or blood in stool.    Medication Administration  Injection # 1:    Medication: Promethazine up to 50mg     Diagnosis: ABDOMINAL PAIN (ICD-789.00)    Route: IM    Site: LUOQ gluteus    Exp Date: 11/2011    Lot #: [id number]    Mfr: novaplus    Comments: 25mg  given    Patient tolerated injection without complications    Given by: Enid Skeens, CMA (September 30, 2010 10:22 AM)  Orders Added: 1)  [facility]- Est Level  3 [id number] 2)  Promethazine up to 50mg  [J2550]

## 2011-01-26 NOTE — Assessment & Plan Note (Signed)
Summary: f/u eo   Vital Signs:  Patient profile:   45 year old female Height:      61.5 inches Weight:      161 pounds BMI:     30.04 BSA:     1.73 Temp:     98.0 degrees F Pulse rate:   62 / minute BP sitting:   146 / 82  Vitals Entered By: Christen Bame CMA (January 23, 2010 10:30 AM) CC: f/u Is Patient Diabetic? No Pain Assessment Patient in pain? yes     Location: all over Intensity: 8   Primary Care Provider:  Mariana Arn  MD  CC:  f/u.  History of Present Illness: 1) DM2: Last A1C 9.2 two months ago. Usually around 7. Reports that since that A1C check she has started taking her medications as instructed. Drinking more water, less juice and soda. Check her feet daily. Denies polyuria, polydipsia. Neurpathic pain in feet (and hands) as below.   2) HTN: Above goal today. Has cut back on salt. Has not increased fruit and vegetable intake. Walks 30 minutes a day 2 times per week in course of daily activites, but no structures exercise program. Denies chest pain, dyspnea, reports occasional LE edema   3) UTI: Dagnosed in [place] from most recent trip, with UTI based on UA and UCx w/  > 100,000 strep agalactiae. ER to call in prescription. Reports increased urination, mild increased urge, but no incontinence, hematura, CVA tenderness or dysuria.   4) Bilateral Foot pain: Reports bilateral neuropathic foot pain - burning sensation in both feet, worse at night. Also reports bilateral calluses which are painful at times. Has had the same diabetic shoes for several years now. Does not report any ulcers or loss of sensation.   5) Depression: On mirtazipine 15 mg by mouth at bedtime. Reports some improvement overall but still reports periods of tearfulness and intense sadness, last one of these was yesterday. Reports anhedonia, decreased energy, decreased appetite (but also w/ h/o cyclic emesis), but denies suicidal or homicidal ideation.        Habits &  Providers  Alcohol-Tobacco-Diet     Tobacco Status: current     Tobacco Counseling: to quit use of tobacco products     Cigarette Packs/Day: 0.25  Current Medications (verified): 1)  Bayer Childrens Aspirin 81 Mg Chew (Aspirin) .... Take 1 Tablet By Mouth Once A Day 2)  Lorazepam 1 Mg Tabs (Lorazepam) .... Take 1 Tablet Every 6 Hours As Needed For Nausea 3)  Promethazine Hcl 25 Mg  Tabs (Promethazine Hcl) .... Take 1 Tablet Every 4 Hours At First Sign of Nausea 4)  Lantus 100 Unit/ml Soln (Insulin Glargine) .... Take 22 Units Subcutaneously At Bedtime. Dispense One Month Supply. 5)  Miralax   Powd (Polyethylene Glycol 3350) .... Mix Packet With Water Once Daily As Needed For Constipation 6)  Phenadoz 25 Mg  Supp (Promethazine Hcl) .... Insert 1 Every 6 Hours As Needed 7)  Promethazine Hcl 25 Mg/ml  Soln (Promethazine Hcl) .... Inject 25 Mg ( 1 Ml) Im Q 6 Hours Prn 8)  Im Injection Supplies .... For Use With Phenergan Injections Qs For Thirty Injections 9)  Metformin Hcl 1000 Mg  Tabs (Metformin Hcl) .Marland Kitchen.. 1 Tab By Mouth Two Times A Day For Diabetes 10)  Hydrocodone-Acetaminophen 5-500 Mg Tabs (Hydrocodone-Acetaminophen) .Marland Kitchen.. 1-2 Tabs By Mouth Every 6 Hours 11)  Coreg 25 Mg Tabs (Carvedilol) .Marland Kitchen.. 1 Tablet By Mouth Two Times A Day 12)  Prilosec  20 Mg Cpdr (Omeprazole) .Marland Kitchen.. 1 Tablet By Mouth Daily 13)  Mirtazapine 30 Mg Tabs (Mirtazapine) .... One Tab By Mouth At Bedtime  Allergies (verified): 1)  ! Percocet 2)  ! Erythromycin  Social History: Packs/Day:  0.25  Physical Exam  General:  looks much better today, NAD  Eyes:  fundi normal, PERRL  Lungs:  Normal respiratory effort, chest expands symmetrically. Lungs are clear to auscultation, No crackles Heart:  Normal rate and regular rhythm. S1 and S2 normal without gallop, murmur, click, rub or other extra sounds Abdomen:  positive BS, no distension, soft, mild tender to palpation mostly epigastric region.  No guarding or  rebound. Pulses:  2+ pedals  Extremities:  no edema  Neurologic:  alert & oriented X3, cranial nerves II-XII intact, strength normal in all extremities, sensation intact to light touch, and sensation intact to pinprick.    Diabetes Management Exam:    Foot Exam (with socks and/or shoes not present):       Sensory-Pinprick/Light touch:          Left medial foot (L-4): normal          Left dorsal foot (L-5): normal          Left lateral foot (S-1): normal          Right medial foot (L-4): normal          Right dorsal foot (L-5): normal          Right lateral foot (S-1): normal       Sensory-Monofilament:          Left foot: normal          Right foot: normal       Inspection:          Left foot: abnormal             Comments: Calluses at 1st and 5th head of MTP           Right foot: abnormal             Comments: Calluses at 1st and 5th head of MTP        Nails:          Left foot: normal          Right foot: normal   Impression & Recommendations:  Problem # 1:  CALLUSES, FEET, BILATERAL (ICD-700) Assessment Deteriorated  Periodically need to be shaved. Script for diabetic shoes replacement.   Orders: [place]- Est  Level 4 [id number])  Problem # 2:  DIABETES MELLITUS, II, COMPLICATIONS (A999333) Assessment: Unchanged  Continue meds as below. Recheck A1C at next appointment. Dietary and exercise plan reveiwed.   Her updated medication list for this problem includes:    Bayer Childrens Aspirin 81 Mg Chew (Aspirin) .Marland Kitchen... Take 1 tablet by mouth once a day    Lantus 100 Unit/ml Soln (Insulin glargine) .Marland Kitchen... Take 22 units subcutaneously at bedtime. dispense one month supply.    Metformin Hcl 1000 Mg Tabs (Metformin hcl) .Marland Kitchen... 1 tab by mouth two times a day for diabetes  Orders: [place]- Est  Level 4 [id number])  Problem # 3:  HYPERTENSION, BENIGN SYSTEMIC (ICD-401.1) Assessment: Deteriorated  Not at goal today. Consider restart HCTZ (has been onthis in the past.) Was on ACE-I in past  but stopped with frequent pre-renal ARF (likely secondary to emesis). Would consider restart for renal protection.  Her updated medication list for this problem includes:    Coreg 25 Mg Tabs (Carvedilol) .Marland KitchenMarland KitchenMarland KitchenMarland Kitchen  1 tablet by mouth two times a day  Orders: [place]- Est  Level 4 [id number])  Problem # 4:  DEPRESSION, MAJOR, RECURRENT (ICD-296.30) Assessment: Unchanged  Increase mirtazapine to 30 mg by mouth at bedtime. See instructions for CBT.   Orders: [place]- Est  Level 4 ([id number])  Problem # 5:  NEUROPATHY, DIABETIC (ICD-250.60) Assessment: Unchanged  Script written for diabetic shoes. Does not want to try anti-neuropathic pain med at this time.  Her updated medication list for this problem includes:    Bayer Childrens Aspirin 81 Mg Chew (Aspirin) .Marland Kitchen... Take 1 tablet by mouth once a day    Lantus 100 Unit/ml Soln (Insulin glargine) .Marland Kitchen... Take 22 units subcutaneously at bedtime. dispense one month supply.    Metformin Hcl 1000 Mg Tabs (Metformin hcl) .Marland Kitchen... 1 tab by mouth two times a day for diabetes  Orders: [facility]- Est  Level 4 [id number])  Complete Medication List: 1)  Bayer Childrens Aspirin 81 Mg Chew (Aspirin) .... Take 1 tablet by mouth once a day 2)  Lorazepam 1 Mg Tabs (Lorazepam) .... Take 1 tablet every 6 hours as needed for nausea 3)  Promethazine Hcl 25 Mg Tabs (Promethazine hcl) .... Take 1 tablet every 4 hours at first sign of nausea 4)  Lantus 100 Unit/ml Soln (Insulin glargine) .... Take 22 units subcutaneously at bedtime. dispense one month supply. 5)  Miralax Powd (Polyethylene glycol 3350) .... Mix packet with water once daily as needed for constipation 6)  Phenadoz 25 Mg Supp (Promethazine hcl) .... Insert 1 every 6 hours as needed 7)  Promethazine Hcl 25 Mg/ml Soln (Promethazine hcl) .... Inject 25 mg ( 1 ml) im q 6 hours prn 8)  Im Injection Supplies  .... For use with phenergan injections qs for thirty injections 9)  Metformin Hcl 1000 Mg Tabs (Metformin hcl) .Marland Kitchen.. 1 tab by mouth  two times a day for diabetes 10)  Hydrocodone-acetaminophen 5-500 Mg Tabs (Hydrocodone-acetaminophen) .Marland Kitchen.. 1-2 tabs by mouth every 6 hours 11)  Coreg 25 Mg Tabs (Carvedilol) .Marland Kitchen.. 1 tablet by mouth two times a day 12)  Prilosec 20 Mg Cpdr (Omeprazole) .Marland Kitchen.. 1 tablet by mouth daily 13)  Mirtazapine 30 Mg Tabs (Mirtazapine) .... One tab by mouth at bedtime 14)  Diabetic Shoes   Patient Instructions: 1)  Follow up in one month.  2)  Pick up your antibiotic for your Urinary Tract Infection and take as directed (take your nausea medicine, take on a full stomach, drink lots of water) 3)  Take two pills of your mirtazipine each night. I will refill this for you with the changed dose.  4)  I have given you a scrpit for diabetic shoes, Get your shores changed.  5)  We will call you with an appointment for the eye doctor.  6)  Try to think of things that make you happy and put these things  into place in your life.  Prescriptions: MIRTAZAPINE 30 MG TABS (MIRTAZAPINE) one tab by mouth at bedtime  #30 x 3   Entered and Authorized by:   Mariana Arn  MD   Signed by:   Mariana Arn  MD on 01/23/2010   Method used:   Print then Give to Patient   RxID:   [id number]    Prevention & Chronic Care Immunizations   Influenza vaccine: Fluvax Non-MCR  (09/25/2008)   Influenza vaccine due: 09/25/2009    Tetanus booster: 06/27/2003: Done.   Tetanus booster due: 06/26/2013    Pneumococcal vaccine: Not documented  Other Screening   Pap smear: hysterectomy  (09/26/2008)   Pap smear due: Not Indicated    Mammogram: normal  (06/20/2009)   Mammogram due: 06/20/2010   Smoking status: current  (01/23/2010)   Smoking cessation counseling: yes  (01/03/2009)  Diabetes Mellitus   HgbA1C: 9.2  (11/17/2009)   Hemoglobin A1C due: 02/17/2010    Eye exam: Not documented   Diabetic eye exam action/deferral: Ophthalmology referral  (01/23/2010)   Eye exam due: 12/15/2009    Foot exam: yes  (01/23/2010)    High risk foot: Not documented   Foot care education: Not documented   Foot exam due: 04/23/2010    Urine microalbumin/creatinine ratio: Not documented   Urine microalbumin/cr due: 12/15/2009    Diabetes flowsheet reviewed?: Yes   Progress toward A1C goal: Unchanged  Lipids   Total Cholesterol: 156  (01/03/2009)   LDL: 89  (01/03/2009)   LDL Direct: Not documented   HDL: 55  (01/03/2009)   Triglycerides: 62  (01/03/2009)   Lipid panel due: 04/23/2010  Hypertension   Last Blood Pressure: 146 / 82  (01/23/2010)   Serum creatinine: 1.32  (09/12/2009)   Serum potassium 4.4  (Q000111Q)   Basic metabolic panel due: A999333    Hypertension flowsheet reviewed?: Yes   Progress toward BP goal: Deteriorated  Self-Management Support :   Personal Goals (by the next clinic visit) :     Personal A1C goal: 7  (11/17/2009)     Personal blood pressure goal: 130/80  (11/17/2009)     Personal LDL goal: 70  (11/17/2009)    Patient will work on the following items until the next clinic visit to reach self-care goals:     Medications and monitoring: take my medicines every day, check my blood sugar, check my blood pressure, bring all of my medications to every visit, weigh myself weekly, examine my feet every day  (01/23/2010)     Eating: drink diet soda or water instead of juice or soda, eat more vegetables, use fresh or frozen vegetables, eat foods that are low in salt, eat baked foods instead of fried foods, eat fruit for snacks and desserts, limit or avoid alcohol  (01/23/2010)     Activity: take a 30 minute walk every day  (01/23/2010)    Diabetes self-management support: Written self-care plan, Education handout  (01/23/2010)   Diabetes care plan printed   Diabetes education handout printed    Hypertension self-management support: Written self-care plan, Education handout  (01/23/2010)   Hypertension self-care plan printed.   Hypertension education handout printed   Appended  Document: Ophtho referral      Clinical Lists Changes  Orders: Added new Referral order of Ophthalmology Referral (Ophthalmology) - Signed

## 2011-01-26 NOTE — Progress Notes (Signed)
Summary: letter info   Phone Note Call from Patient Call back at Home Phone [phone]   Caller: Patient Summary of Call: pt states that Dr is supposed to write a letter for her and pls send to Ginette Otto -fax to [phone] Initial call taken by: Audie Clear,  January 15, 2010 2:38 PM  Follow-up for Phone Call        Letter written  - regarding application for disability. Placed in to be faxed pile.  Follow-up by: Mariana Arn  MD,  January 19, 2010 9:21 AM

## 2011-01-26 NOTE — Assessment & Plan Note (Signed)
Summary: f/u ED visit/eo   Vital Signs:  Patient profile:   45 year old female Height:      61.5 inches Weight:      176.8 pounds BMI:     32.98 Temp:     98.6 degrees F oral Pulse rate:   73 / minute BP sitting:   115 / 78  (left arm) Cuff size:   regular  Vitals Entered By: Levert Feinstein LPN (August 30, 624THL 9:44 AM) CC: ed f/u Is Patient Diabetic? Yes Did you bring your meter with you today? No Pain Assessment Patient in pain? yes     Location: stomach   Primary Care Provider:  Mariana Arn  MD  CC:  ed f/u.  History of Present Illness: 1) Cyclic emesis: ER visit with usual abdominal pain, intractable nausea, dehydration (see notes) over the weekend, but not admitted to the hospital. Rehydration, IV anti-emetics, pain medications. Somewhat improved but continues to have nausea and abdominal pain. Does not need refills on any of her medications for her cyclic emesis. Denies emesis since discharge from ER. Also had some constipation which she has chronically, last bowel movement was 5 days prior. Has been able to keep down some liquids and medications, but solids cause a lot of nausea.   ROS: Denies fever, diarrhea, syncope, dysuria, hematuria,       Habits & Providers  Alcohol-Tobacco-Diet     Tobacco Status: current     Tobacco Counseling: to quit use of tobacco products  Current Medications (verified): 1)  Bayer Childrens Aspirin 81 Mg Chew (Aspirin) .... Take 1 Tablet By Mouth Once A Day 2)  Lorazepam 1 Mg Tabs (Lorazepam) .... Take 1 Tablet Every 6 Hours As Needed For Nausea 3)  Promethazine Hcl 25 Mg  Tabs (Promethazine Hcl) .... Take 1 Tablet Every 4 Hours At First Sign of Nausea 4)  Lantus 100 Unit/ml Soln (Insulin Glargine) .... Take 22 Units Subcutaneously Each Morning. Increase By One Unit A Day For Each Fasting Blood Glucose Over 110. Dispense One Month Supply. 5)  Miralax   Powd (Polyethylene Glycol 3350) .... Mix Packet With Water Once Daily As Needed For  Constipation 6)  Phenadoz 25 Mg  Supp (Promethazine Hcl) .... Insert 1 Every 6 Hours As Needed 7)  Promethazine Hcl 25 Mg/ml  Soln (Promethazine Hcl) .... Inject 25 Mg ( 1 Ml) Im Q 6 Hours Prn 8)  Im Injection Supplies .... For Use With Phenergan Injections Qs For Thirty Injections 9)  Metformin Hcl 1000 Mg  Tabs (Metformin Hcl) .Marland Kitchen.. 1 Tab By Mouth Two Times A Day For Diabetes 10)  Hydrocodone-Acetaminophen 5-500 Mg Tabs (Hydrocodone-Acetaminophen) .Marland Kitchen.. 1-2 Tabs By Mouth Every 6 Hours 11)  Coreg 25 Mg Tabs (Carvedilol) .Marland Kitchen.. 1 Tablet By Mouth Two Times A Day 12)  Prilosec 20 Mg Cpdr (Omeprazole) .Marland Kitchen.. 1 Tablet By Mouth Daily 13)  Mirtazapine 30 Mg Tabs (Mirtazapine) .... One Tab By Mouth At Bedtime 14)  Diabetic Shoes 15)  Flexeril 5 Mg Tabs (Cyclobenzaprine Hcl) .... One Tab By Mouth Three Times A Day 16)  Amlodipine Besylate 10 Mg Tabs (Amlodipine Besylate) .... One Tab Po Qday 17)  Epipen 0.3 Mg/0.45ml Devi (Epinephrine) .... Give One Dose Im As Needed For Severe Anaphylaxis (See Instructions On Use) 18)  Colace 100 Mg Caps (Docusate Sodium) .... One Tab By Mouth Two Times A Day As Needed For Constipation  Allergies (verified): 1)  ! Percocet 2)  ! Erythromycin  Physical Exam  General:  NAD, alert, appears as though she is not feeling,  Mouth:  moist membranes  Neck:  No deformities, masses, or tenderness noted. Lungs:  Normal respiratory effort, chest expands symmetrically. Lungs are clear to auscultation, no crackles or wheezes. Heart:  RRR no murmurs  Abdomen:  soft.  ttp in the epigastrium. + BS; no rigidity or guarding.   Pulses:  2+ dp pulses Neurologic:  alert & oriented X3 and cranial nerves II-XII intact.     Impression & Recommendations:  Problem # 1:  PERSISTENT VOMITING (ICD-536.2) Assessment Deteriorated Will give Phenergan 12.5 mg IM now. Will given morphine 2 mg IM now as well. Appears to be improving as compared to this weekend. Will add Colace to bowel regimen.  Follow up in 6 weeks. Advised to continue home anti-emetics. Reviewed red flags that would propmt return to care.   Orders: Promethazine up to 50mg  (J2550) [place]- Est Level  3 [id number])  Complete Medication List: 1)  Bayer Childrens Aspirin 81 Mg Chew (Aspirin) .... Take 1 tablet by mouth once a day 2)  Lorazepam 1 Mg Tabs (Lorazepam) .... Take 1 tablet every 6 hours as needed for nausea 3)  Promethazine Hcl 25 Mg Tabs (Promethazine hcl) .... Take 1 tablet every 4 hours at first sign of nausea 4)  Lantus 100 Unit/ml Soln (Insulin glargine) .... Take 22 units subcutaneously each morning. increase by one unit a day for each fasting blood glucose over 110. dispense one month supply. 5)  Miralax Powd (Polyethylene glycol 3350) .... Mix packet with water once daily as needed for constipation 6)  Phenadoz 25 Mg Supp (Promethazine hcl) .... Insert 1 every 6 hours as needed 7)  Promethazine Hcl 25 Mg/ml Soln (Promethazine hcl) .... Inject 25 mg ( 1 ml) im q 6 hours prn 8)  Im Injection Supplies  .... For use with phenergan injections qs for thirty injections 9)  Metformin Hcl 1000 Mg Tabs (Metformin hcl) .Marland Kitchen.. 1 tab by mouth two times a day for diabetes 10)  Hydrocodone-acetaminophen 5-500 Mg Tabs (Hydrocodone-acetaminophen) .Marland Kitchen.. 1-2 tabs by mouth every 6 hours 11)  Coreg 25 Mg Tabs (Carvedilol) .Marland Kitchen.. 1 tablet by mouth two times a day 12)  Prilosec 20 Mg Cpdr (Omeprazole) .Marland Kitchen.. 1 tablet by mouth daily 13)  Mirtazapine 30 Mg Tabs (Mirtazapine) .... One tab by mouth at bedtime 14)  Diabetic Shoes  15)  Flexeril 5 Mg Tabs (Cyclobenzaprine hcl) .... One tab by mouth three times a day 16)  Amlodipine Besylate 10 Mg Tabs (Amlodipine besylate) .... One tab po qday 17)  Epipen 0.3 Mg/0.18ml Devi (Epinephrine) .... Give one dose im as needed for severe anaphylaxis (see instructions on use) 18)  Colace 100 Mg Caps (Docusate sodium) .... One tab by mouth two times a day as needed for constipation  Other  Orders: A1C-[facility] [id number]) Morphine Sulfate inj 10 mg [id number])  Patient Instructions: 1)  Follow up in 6 weeks 2)  Have a great trip. 3)  Take colace as directed as needed for constipation  4)  Continue to take your Miralax - you can take with juice 5)  Take your anti-nausea medications as directed.  6)  Continue to take your diabetes medicines (BUT if you are having vomiting stop taking the metformin until the vomiting is resolved) Prescriptions: COLACE 100 MG CAPS (DOCUSATE SODIUM) one tab by mouth two times a day as needed for constipation  #60 x 3   Entered and Authorized by:   Mariana Arn  MD  Signed by:   Mariana Arn  MD on 08/25/2010   Method used:   Electronically to        [facility] [address] [id number][facility] (retail)       [address]       [place], [place]  [address]       Ph: [phone] or [phone]       Fax: [phone]   RxID:   [id number]   Laboratory Results   Blood Tests   Date/Time Received: August 25, 2010 9:40 AM  Date/Time Reported: August 25, 2010 10:11 AM   HGBA1C: 9.3%   (Normal Range: Non-Diabetic - 3-6%   Control Diabetic - 6-8%)  Comments: ...............test performed by.................Marland KitchenLevert Feinstein, LPN .............entered by...........Marland KitchenBonnie A. Martinique, MLS (ASCP)cm       Medication Administration  Injection # 1:    Medication: Morphine Sulfate inj 10 mg    Diagnosis: ABDOMINAL PAIN (ICD-789.00)    Route: IM    Site: LUOQ gluteus    Exp Date: 09/27/2011    Lot #: [id number]    Mfr: Hospira    Comments: Patient recieved 2mg  of Morphine    Patient tolerated injection without complications    Given by: Levert Feinstein LPN (August 30, 624THL 5:05 PM)  Injection # 2:    Medication: Promethazine up to 50mg     Diagnosis: PERSISTENT VOMITING (ICD-536.2)    Route: IM    Site: RUOQ gluteus    Exp Date: 02/25/2012    Lot #: [id number]    Mfr: Novaplus    Comments: Patient recieved 12.5mg  of phenergan    Patient  tolerated injection without complications    Given by: Levert Feinstein LPN (August 30, 624THL 5:05 PM)  Orders Added: 1)  A1C-[facility] [83036] 2)  Morphine Sulfate inj 10 mg [J2270] 3)  Promethazine up to 50mg  [J2550] 4)  [place]- Est Level  3 [id number]

## 2011-01-26 NOTE — Progress Notes (Signed)
Summary: phn msg   Phone Note Call from Patient Call back at [phone]   Caller: Patient Summary of Call: pt said that information was to be faxed to [facility] yesterday and everything was but the well care card.  She said that she gave a copy of this to the nurse.  Needs to be faxed so she can get her med delivered. Initial call taken by: Raymond Gurney,  April 22, 2010 9:37 AM  Follow-up for Phone Call        Information refaxed. Follow-up by: Levert Feinstein LPN,  April 27, 624THL 4:47 PM

## 2011-01-26 NOTE — Assessment & Plan Note (Signed)
Summary: Hospital Admission: Pancreatitis   Primary Care Provider:  Mariana Arn  MD   History of Present Illness: [name] [name] w/ PMHx/o cyclic vomiting syndrome presenting w/ 2 day hx/o LUQ pain. Pt states pain started in am 1 day prior to admission. Pt describes pain dull/nonspecific w/ radiation of sharp pain to back. Pt reports eating high fat diet over last 4-5 days because "money was short". Pt also reports recent dx/o UTI to which pt was evaluated in ED, recieved amoxicilin per pt. Pt denies any dyasuria, increased urinary urgency/incontinence since completion of abx course. Pt denies any alleviating/aggravating facors for pain. Pt reports one episode of emesis on day of admission. However, pt states that current pain is not like previous abd migraines or episodes of cyclic vomiting. Pt w/ home vicodin, which pt states was not helping pain. Pt denies any abd pain, constipation. Pt w/ episode of emesis x1, and 3 BMs w/in 24hours which is highly abnormal for pt per pt.   Allergies: 1)  ! Percocet 2)  ! Erythromycin  Past History:  Past Medical History: Last updated: 123456 Cyclic Vomiting Syndrome/Abdominal migraines with multiple hospital admissions since 2005 (12 admissions in 2008, 4 admissions in 2009). -- UGI (5/03)-> hiatal hernia, mild duodenitis --Normal Gastric Emptying study 6/08 -- EGD by Dr. Henrene Pastor (12/2003) -> esophageal inflammation, ulcerations, hiatal hernia --EGD by Dr. Lajoyce Corners (2/08)-> thickened gastric fold in fundus -> bx showed chronic gastritis, no h pylori, no metaplasia -- evaluated at [facility] (04/2009)- had repeat gastric emptying study that was normal.  recurrent UTIs --Pyelo hospitalization in 12/02, 10/04  cardiac cath (8/08)- Dr. Shelva Majestic- normal EF, normal coronaries  Past Surgical History: Last updated: 09/25/2008 Cholecystectomy, ex lap - 01/20/2001 [id number] (3 TAB) 3 c/s - 01/20/2001 tah, right salpingo-ophorectomy for pelvic pain and R  tubo-ovarian abscess - 03/13/2002 small bowel obstruction with lysis of adhesions- 03/21/02 central venous port placed- 07/2005  Family History: Last updated: 05/12/2009 Diabetes 1st degree, Mother died of MI in 04-13-23 - pt very sad about this Pt also with 3 of 4 siblings with HTN.  No family hx migraines or cyclic vomiting Family History of Colon Cancer:Cousin  Social History: Last updated: 06/05/2009 Monogamous; four children; twin sons, son , daughter all in their 2s.  States she lives 'pretty much' alone;  10th grade education; denies physical abuse; denies etoh use.  Admits to [facility] use.  Smokes 1 pack q 3 days.  Brother died in winter when she was 89 - she gets depressed every year in winter (?SAD).  Suicide attempt winter (2003).  Working at [employer] - under significant financial stress due to decreased hours at [employer]- only working 6 hours/week.  Risk Factors: Smoking Status: current (01/23/2010) Packs/Day: 0.25 (01/23/2010)  Physical Exam  General:  alert and well-developed.   Head:  normocephalic and atraumatic.   Eyes:  vision grossly intact, pupils equal, pupils round, and pupils reactive to light.   Ears:  R ear normal and L ear normal.   Nose:  no external deformity.   Mouth:  good dentition and no gingival abnormalities.   Neck:  supple and full ROM.   Chest Wall:  no deformities and no tenderness.   Lungs:  normal respiratory effort, no accessory muscle use, and normal breath sounds.   Heart:  normal rate, regular rhythm, no murmur, and no gallop.  No JVD, HJR noted. Abdomen:  tenderness to palpation of LUQ and left mid back Msk:  normal ROM, no  joint tenderness, and no joint swelling.   Extremities:  no edema noted  Neurologic:  alert & oriented X3.   Cervical Nodes:  no anterior cervical adenopathy.   Additional Exam:  Abd CT- no acute findings; nl pancreas Lip- 104 WBC 6.9> 11.4/33.6<197 UA: traumatic catch CMET: 132/3.9/103/23/18/1.49/Glu 29; Tbili 0.6, ALP  63, AST/ALT 15/11; TP 7.4, Alb 4.0, Cal 9.0 Cr 1.49, nl LFTs    Impression & Recommendations:  Problem # 1:  PANCREATITIS (ICD-577.0) Pt w/ likely early pancreatitis given overall clinical history/presentation. Will start pt w/ aggressive fluid hydration w/ NS @250  w/ pt NPO w/ close observation of pt being to tolerate by mouth intake. Will place pt on scheduled morphine and tylenol for pain. Will also check lactic acid to rule out mesenteric ischemia-though preentation not concerning. Also plan to repeat lipase Will also obtain UDS, ETOH level for drug causes of abd pain.   Problem # 2:  DIABETES MELLITUS, II, COMPLICATIONS (A999333) Will place pt on SSI and lantus 11units (half of home dose) w/ home metformin being held secondary to risk lactic acidosis. May consider changing IVF to D5 if pt hypoglycemic while NPO.  Her updated medication list for this problem includes:    Bayer Childrens Aspirin 81 Mg Chew (Aspirin) .Marland Kitchen... Take 1 tablet by mouth once a day    Lantus 100 Unit/ml Soln (Insulin glargine) .Marland Kitchen... Take 22 units subcutaneously at bedtime. dispense one month supply.    Metformin Hcl 1000 Mg Tabs (Metformin hcl) .Marland Kitchen... 1 tab by mouth two times a day for diabetes  Problem # 3:  PERSISTENT VOMITING (ICD-536.2) Will continue pt on home regimen which includes promethazine 25mg  by mouth q6hr as needed for nausea. Currently pt denying any nausea  or worsening nausea s/p emesis episode early in am.   Problem # 4:  HYPERTENSION, BENIGN SYSTEMIC (ICD-401.1) Will cont home coreg Her updated medication list for this problem includes:    Coreg 25 Mg Tabs (Carvedilol) .Marland Kitchen... 1 tablet by mouth two times a day  Problem # 5:  GERD (ICD-530.81) Will place pt on protonix Her updated medication list for this problem includes:    Prilosec 20 Mg Cpdr (Omeprazole) .Marland Kitchen... 1 tablet by mouth daily  Problem # 6:  Prophylaxis heparin and PPI  Problem # 7:  FEN/GI  NS @ 250, NPO w/ evaluation of pt by  mouth intake. ppi, as needed promethazine  Problem # 8:  dispo Pending further evaluation.  Complete Medication List: 1)  Bayer Childrens Aspirin 81 Mg Chew (Aspirin) .... Take 1 tablet by mouth once a day 2)  Lorazepam 1 Mg Tabs (Lorazepam) .... Take 1 tablet every 6 hours as needed for nausea 3)  Promethazine Hcl 25 Mg Tabs (Promethazine hcl) .... Take 1 tablet every 4 hours at first sign of nausea 4)  Lantus 100 Unit/ml Soln (Insulin glargine) .... Take 22 units subcutaneously at bedtime. dispense one month supply. 5)  Miralax Powd (Polyethylene glycol 3350) .... Mix packet with water once daily as needed for constipation 6)  Phenadoz 25 Mg Supp (Promethazine hcl) .... Insert 1 every 6 hours as needed 7)  Promethazine Hcl 25 Mg/ml Soln (Promethazine hcl) .... Inject 25 mg ( 1 ml) im q 6 hours prn 8)  Im Injection Supplies  .... For use with phenergan injections qs for thirty injections 9)  Metformin Hcl 1000 Mg Tabs (Metformin hcl) .Marland Kitchen.. 1 tab by mouth two times a day for diabetes 10)  Hydrocodone-acetaminophen 5-500 Mg Tabs (Hydrocodone-acetaminophen) .Marland KitchenMarland KitchenMarland Kitchen  1-2 tabs by mouth every 6 hours 11)  Coreg 25 Mg Tabs (Carvedilol) .Marland Kitchen.. 1 tablet by mouth two times a day 12)  Prilosec 20 Mg Cpdr (Omeprazole) .Marland Kitchen.. 1 tablet by mouth daily 13)  Mirtazapine 30 Mg Tabs (Mirtazapine) .... One tab by mouth at bedtime 14)  Diabetic Shoes  15)  Tussionex Pennkinetic Er 8-10 Mg/85ml Lqcr (Chlorpheniramine-hydrocodone) .... 5 ml by mouth q 12 hours as needed for cough. disp 50 ml

## 2011-01-26 NOTE — Assessment & Plan Note (Signed)
Summary: [place] # [id number]   Primary Care [name] [name]:  [name] Arn  MD  CC:  vomiting.  History of Present Illness: Pt started "feeling bad" (vomiting) 4 days ago and went to the UC and was then sent to the ED. She was given some meds and sent home. She came to the clinic 3 days ago and was given a shot of phenergan and sent home. Yesterday she called but couldn't come into the [facility] and today she called the clinic again and afterwards called EMS. She has she has been vomiting for several days. (starting 4 days ago) and has a long standing h/o this. This morning the vomiting woke her up. She sleeps with a bucket beside her bed because she never knows when she will wake up with vomiting. She has been diagnosed wiht Cyclic Vomiting Syndrome. Usually phenergan and pain meds stop it but not this time. Of note, this is the 8th year of her mother's passing. Pt has been able to keep a few meds down today. Some days she vomits everything by mouth.   Habits & Providers  Alcohol-Tobacco-Diet     Tobacco Status: current  Allergies: 1)  ! Percocet 2)  ! Erythromycin  Past History:  Past Medical History: Last updated: [id number] Cyclic Vomiting Syndrome/Abdominal migraines with multiple hospital admissions since 2005 (12 admissions in 2008, 4 admissions in 2009). -- UGI (5/03)-> hiatal hernia, mild duodenitis --Normal Gastric Emptying study 6/08 -- EGD by Dr. Henrene Pastor (12/2003) -> esophageal inflammation, ulcerations, hiatal hernia --EGD by Dr. Lajoyce Corners (2/08)-> thickened gastric fold in fundus -> bx showed chronic gastritis, no h pylori, no metaplasia -- evaluated at [facility] (04/2009)- had repeat gastric emptying study that was normal.  recurrent UTIs --Pyelo hospitalization in 12/02, 10/04  cardiac cath (8/08)- Dr. Shelva Majestic- normal EF, normal coronaries  Past Surgical History: Last updated: 09/25/2008 Cholecystectomy, ex lap - 01/20/2001 [id number] (3 TAB) 3 c/s - 01/20/2001 tah,  right salpingo-ophorectomy for pelvic pain and R tubo-ovarian abscess - 03/13/2002 small bowel obstruction with lysis of adhesions- 03/21/02 central venous port placed- 07/2005  Family History: Last updated: 05/12/2009 Diabetes 1st degree, Mother died of MI in April 01, 2023 - pt very sad about this Pt also with 3 of 4 siblings with HTN.  No family hx migraines or cyclic vomiting Family History of Colon Cancer:Cousin  Social History: Last updated: 06/05/2009 Monogamous; four children; twin sons, son , daughter all in their 8s.  States she lives 'pretty much' alone;  10th grade education; denies physical abuse; denies etoh use.  Admits to [facility] use.  Smokes 1 pack q 3 days.  Brother died in winter when she was 19 - she gets depressed every year in winter (?SAD).  Suicide attempt winter (2003).  Working at [employer] - under significant financial stress due to decreased hours at [employer]- only working 6 hours/week.  Review of Systems       + vomiting, no diarrhea, no fevers, no chills,   Physical Exam  General:  Well-developed,well-nourished,in no acute distress; alert,appropriate and cooperative throughout examination Head:  Normocephalic and atraumatic without obvious abnormalities. No apparent alopecia or balding. Eyes:  vision grossly intact, pupils equal, pupils round, and pupils reactive to light.  EOMI Ears:  no external deformities.   Nose:  no external deformity.   Neck:  Rt chest port-a-cath with scaring over it.  Lungs:  Normal respiratory effort, chest expands symmetrically. Lungs are clear to auscultation, no crackles or wheezes.  Heart:  Normal rate and regular rhythm. S1 and S2 normal without gallop, murmur, click, rub or other extra sounds. Abdomen:  Bowel sounds positive,abdomen soft and non-tender without masses, organomegaly or hernias noted. Msk:  No deformity or scoliosis noted of thoracic or lumbar spine.  normal ROM.   Pulses:  R dorsalis pedis normal and L dorsalis pedis normal.    Extremities:  No clubbing, cyanosis, edema, or deformity noted with normal full range of motion of all joints.   Neurologic:  alert & oriented X3 and strength normal in all extremities.   Skin:  Intact without suspicious lesions or rashes Psych:  Cognition and judgment appear intact. Alert and cooperative with normal attention span and concentration. No apparent delusions, illusions, hallucinations   Complete Medication List: 1)  Bayer Childrens Aspirin 81 Mg Chew (Aspirin) .... Take 1 tablet by mouth once a day 2)  Lorazepam 1 Mg Tabs (Lorazepam) .... Take 1 tablet every 6 hours as needed for nausea 3)  Promethazine Hcl 25 Mg Tabs (Promethazine hcl) .... Take 1 tablet every 4 hours at first sign of nausea 4)  Lantus 100 Unit/ml Soln (Insulin glargine) .... Take 22 units subcutaneously each morning. increase by one unit a day for each fasting blood glucose over 110. dispense one month supply. 5)  Miralax Powd (Polyethylene glycol 3350) .... Mix packet with water once daily as needed for constipation 6)  Phenadoz 25 Mg Supp (Promethazine hcl) .... Insert 1 every 6 hours as needed 7)  Promethazine Hcl 25 Mg/ml Soln (Promethazine hcl) .... Inject 25 mg ( 1 ml) im q 6 hours prn 8)  Im Injection Supplies  .... For use with phenergan injections qs for thirty injections 9)  Metformin Hcl 1000 Mg Tabs (Metformin hcl) .Marland Kitchen.. 1 tab by mouth two times a day for diabetes 10)  Hydrocodone-acetaminophen 5-500 Mg Tabs (Hydrocodone-acetaminophen) .Marland Kitchen.. 1-2 tabs by mouth every 6 hours 11)  Coreg 25 Mg Tabs (Carvedilol) .Marland Kitchen.. 1 tablet by mouth two times a day 12)  Prilosec 20 Mg Cpdr (Omeprazole) .Marland Kitchen.. 1 tablet by mouth daily 13)  Mirtazapine 30 Mg Tabs (Mirtazapine) .... One tab by mouth at bedtime 14)  Diabetic Shoes  15)  Flexeril 5 Mg Tabs (Cyclobenzaprine hcl) .... One tab by mouth three times a day 16)  Amlodipine Besylate 10 Mg Tabs (Amlodipine besylate) .... One tab po qday 17)  Epipen 0.3 Mg/0.3ml Devi  (Epinephrine) .... Give one dose im as needed for severe anaphylaxis (see instructions on use) 18)  Colace 100 Mg Caps (Docusate sodium) .... One tab by mouth two times a day as needed for constipation  Labs:  CBC: 7.3>12.8/36.7<191 BMET: 138/3.8/98/26/16/1.73<394   Ca 9.8  UA: cloudy, > 1000 glu, 40 ketones, large blood, > 300 Pr.  Umicro: WBC 0-2, RBC 11-20, many Bacteria UCx Pending   A/P: 45 y/o F with cyclic vomiting syndrome, DM2 and depression comes in with vomiting and hyperglycemia.  1: Vomiting: Pt is here for vomiting. She has had these episodes in the past. Plan to treat with Zofran 4mg  IV scheduled q 6 hr with home dose of phernergan supp q 6 hr as needed. This has been worked up extensively in the past. Plan to treat and not do a lot of work up with treatment is working.   2: UTI: Pt appears to have a UTI but has few WBC's in her urine. Must consider ureteral stone as well with all the blood in her urine. Renal US ordered for am  since pt was complaining of some left sided flank pain. Plan to treat with ceftriaxone until Ucx comes back. Will cont IVF at 125 cc/hr.   3: Hyperglycemia: Pt is hyperglycemic. Last A1c on 08/25/10 was 9.3. Plan to decrease her home doses of Lantus and do a SSI since the patient is not currently eating anything but ice chips. CBG's q4 while not eating.   4: Anxiety: Pt takes Ativan at home for anxiety. Plan to continue this medication.   5: hypertension: Pt is not taking by mouth meds currently so plan to treat with 25 mg IV q6 as needed for SBP > 140.   Prophy: Will need to start Heparin 5000 u three times a day in the am. FEN/GI: sips and chips, IVF at 125 cc/hr Dispo: Pending her improvement in vomiting.

## 2011-01-26 NOTE — Progress Notes (Signed)
Summary: Vomiting   Phone Note Call from Patient   Caller: Patient Details for Reason: Vomiting  Summary of Call: Pt states she thinks she has a virus, feels like she has a cold but is SOB. Patients states at 3pm she began vomiting as usual, tried her Ativan and Phenergan but continues to vomit. Patient to come to ED for IV anti-emtics, have vitals checked as I am concerned about her dyspnea. Reiterated if everything looks okay and ER able to stop her vomiting she may not need admission. Pt voiced understanding. Her daughter is to bring her to ED.     Birdena Crandall MD ( Took call as it paged to intern pager) Initial call taken by: Marlana Salvage MD,  February 10, 2010 6:17 PM

## 2011-01-26 NOTE — Progress Notes (Signed)
Summary: triage   Phone Note Call from Patient Call back at Home Phone [phone]   Caller: Patient Summary of Call: pt has cyclic vomiting and she is having severe side pain and it has triggered her vomiting.  needs to talk to nurse Initial call taken by: Audie Clear,  September 29, 2010 11:04 AM  Follow-up for Phone Call        we have no appts left for today. offered UC. she refused. appt made for tomorrow am at 8:30. told her if unable to wait uc is opn until 8 & may use ED. states she does not want to use ED Follow-up by: Elige Radon RN,  September 29, 2010 11:16 AM

## 2011-01-26 NOTE — Assessment & Plan Note (Signed)
Summary: f/u,df   Vital Signs:  Patient profile:   45 year old female Weight:      164.4 pounds BMI:     30.67 Temp:     98.1 degrees F Pulse rate:   69 / minute BP sitting:   142 / 82  (left arm)  Vitals Entered By: Geanie Cooley RN (June 09, 2010 3:17 PM) CC: f/u Is Patient Diabetic? Yes Did you bring your meter with you today? Yes Pain Assessment Patient in pain? yes     Location: back Intensity: 7   Primary Care Provider:  Mariana Arn  MD  CC:  f/u.  History of Present Illness: 1) HTN: BP today 142/82, was 158 / 65 at last visit. Started on Norvasc 10 mg on discharge from hospital but still has not filled script (reports that she never received the medication from her pharmacy - though I faxed the script over at her last visit. Denies chest pain, dyspnea, LE edema. Also on Coreg. Cr. 1.61 on discharge from hospital. Has been on an ACE-I in the past but was discontinued secondary to elevated Cr during hospitalization about two years ago and not restarted by her previous PCP.   3) DM2: A1C in hospital = 10.4. Was 9.2 in November 2010. Denies polyuria, polydipsia. Still does not have her meter - this time "the battery ran low", so still does not check sugars. Figured out that meter uses double A batteries. Plans to get batteries today.  Current Medications (verified): 1)  Bayer Childrens Aspirin 81 Mg Chew (Aspirin) .... Take 1 Tablet By Mouth Once A Day 2)  Lorazepam 1 Mg Tabs (Lorazepam) .... Take 1 Tablet Every 6 Hours As Needed For Nausea 3)  Promethazine Hcl 25 Mg  Tabs (Promethazine Hcl) .... Take 1 Tablet Every 4 Hours At First Sign of Nausea 4)  Lantus 100 Unit/ml Soln (Insulin Glargine) .... Take 22 Units Subcutaneously Each Morning. Increase By One Unit A Day For Each Fasting Blood Glucose Over 110. Dispense One Month Supply. 5)  Miralax   Powd (Polyethylene Glycol 3350) .... Mix Packet With Water Once Daily As Needed For Constipation 6)  Phenadoz 25 Mg  Supp  (Promethazine Hcl) .... Insert 1 Every 6 Hours As Needed 7)  Promethazine Hcl 25 Mg/ml  Soln (Promethazine Hcl) .... Inject 25 Mg ( 1 Ml) Im Q 6 Hours Prn 8)  Im Injection Supplies .... For Use With Phenergan Injections Qs For Thirty Injections 9)  Metformin Hcl 1000 Mg  Tabs (Metformin Hcl) .Marland Kitchen.. 1 Tab By Mouth Two Times A Day For Diabetes 10)  Hydrocodone-Acetaminophen 5-500 Mg Tabs (Hydrocodone-Acetaminophen) .Marland Kitchen.. 1-2 Tabs By Mouth Every 6 Hours 11)  Coreg 25 Mg Tabs (Carvedilol) .Marland Kitchen.. 1 Tablet By Mouth Two Times A Day 12)  Prilosec 20 Mg Cpdr (Omeprazole) .Marland Kitchen.. 1 Tablet By Mouth Daily 13)  Mirtazapine 30 Mg Tabs (Mirtazapine) .... One Tab By Mouth At Bedtime 14)  Diabetic Shoes 15)  Tussionex Pennkinetic Er 8-10 Mg/77ml Lqcr (Chlorpheniramine-Hydrocodone) .... 5 Ml By Mouth Q 12 Hours As Needed For Cough. Disp 50 Ml 16)  Flexeril 5 Mg Tabs (Cyclobenzaprine Hcl) .... One Tab By Mouth Three Times A Day 17)  Flagyl 500 Mg Tabs (Metronidazole) .... One Tab By Mouth Two Times A Day X 7 Days 18)  Amlodipine Besylate 10 Mg Tabs (Amlodipine Besylate) .... One Tab Po Qday 19)  Epipen 0.3 Mg/0.52ml Devi (Epinephrine) .... Give One Dose Im As Needed For Severe Anaphylaxis (See Instructions On  Use)  Allergies (verified): 1)  ! Percocet 2)  ! Erythromycin  Review of Systems       as per HPI o/w negative except for chronic low back pain  Physical Exam  General:  NAD,happy and pleasant Lungs:  Normal respiratory effort, chest expands symmetrically. Lungs are clear to auscultation, no crackles or wheezes. Heart:  RRR no murmurs   Diabetes Management Exam:    Foot Exam (with socks and/or shoes not present):       Sensory-Pinprick/Light touch:          Left medial foot (L-4): normal          Left dorsal foot (L-5): normal          Left lateral foot (S-1): normal          Right medial foot (L-4): normal          Right dorsal foot (L-5): normal          Right lateral foot (S-1): normal        Sensory-Monofilament:          Left foot: normal          Right foot: normal       Inspection:          Left foot: normal          Right foot: normal       Nails:          Left foot: normal          Right foot: normal   Impression & Recommendations:  Problem # 1:  HYPERTENSION, BENIGN SYSTEMIC (ICD-401.1) Assessment Unchanged  Started on Norvasc as below. Not at goal. Reviewed DASH diet and exercise. Consider restart ACE-I if Cr. will allow.  Her updated medication list for this problem includes:    Coreg 25 Mg Tabs (Carvedilol) .Marland Kitchen... 1 tablet by mouth two times a day    Amlodipine Besylate 10 Mg Tabs (Amlodipine besylate) ..... One tab po qday  Orders: [facility]- Est  Level 4 [id number])  Problem # 2:  DIABETES MELLITUS, II, COMPLICATIONS (A999333) Assessment: Unchanged  . Continue metformin, Lantus.  Reviewed diet and exercise. Follow up 6 weeks, recheck a1c, bmet. reviewed importance of checking sugars.  Her updated medication list for this problem includes:    Bayer Childrens Aspirin 81 Mg Chew (Aspirin) .Marland Kitchen... Take 1 tablet by mouth once a day    Lantus 100 Unit/ml Soln (Insulin glargine) .Marland Kitchen... Take 22 units subcutaneously each morning. increase by one unit a day for each fasting blood glucose over 110. dispense one month supply.    Metformin Hcl 1000 Mg Tabs (Metformin hcl) .Marland Kitchen... 1 tab by mouth two times a day for diabetes  Orders: [facility]- Est  Level 4 [id number])  Complete Medication List: 1)  Bayer Childrens Aspirin 81 Mg Chew (Aspirin) .... Take 1 tablet by mouth once a day 2)  Lorazepam 1 Mg Tabs (Lorazepam) .... Take 1 tablet every 6 hours as needed for nausea 3)  Promethazine Hcl 25 Mg Tabs (Promethazine hcl) .... Take 1 tablet every 4 hours at first sign of nausea 4)  Lantus 100 Unit/ml Soln (Insulin glargine) .... Take 22 units subcutaneously each morning. increase by one unit a day for each fasting blood glucose over 110. dispense one month supply. 5)  Miralax Powd  (Polyethylene glycol 3350) .... Mix packet with water once daily as needed for constipation 6)  Phenadoz 25 Mg Supp (Promethazine hcl) .... Insert 1 every  6 hours as needed 7)  Promethazine Hcl 25 Mg/ml Soln (Promethazine hcl) .... Inject 25 mg ( 1 ml) im q 6 hours prn 8)  Im Injection Supplies  .... For use with phenergan injections qs for thirty injections 9)  Metformin Hcl 1000 Mg Tabs (Metformin hcl) .Marland Kitchen.. 1 tab by mouth two times a day for diabetes 10)  Hydrocodone-acetaminophen 5-500 Mg Tabs (Hydrocodone-acetaminophen) .Marland Kitchen.. 1-2 tabs by mouth every 6 hours 11)  Coreg 25 Mg Tabs (Carvedilol) .Marland Kitchen.. 1 tablet by mouth two times a day 12)  Prilosec 20 Mg Cpdr (Omeprazole) .Marland Kitchen.. 1 tablet by mouth daily 13)  Mirtazapine 30 Mg Tabs (Mirtazapine) .... One tab by mouth at bedtime 14)  Diabetic Shoes  15)  Tussionex Pennkinetic Er 8-10 Mg/74ml Lqcr (Chlorpheniramine-hydrocodone) .... 5 ml by mouth q 12 hours as needed for cough. disp 50 ml 16)  Flexeril 5 Mg Tabs (Cyclobenzaprine hcl) .... One tab by mouth three times a day 17)  Flagyl 500 Mg Tabs (Metronidazole) .... One tab by mouth two times a day x 7 days 18)  Amlodipine Besylate 10 Mg Tabs (Amlodipine besylate) .... One tab po qday 19)  Epipen 0.3 Mg/0.78ml Devi (Epinephrine) .... Give one dose im as needed for severe anaphylaxis (see instructions on use) Prescriptions: HYDROCODONE-ACETAMINOPHEN 5-500 MG TABS (HYDROCODONE-ACETAMINOPHEN) 1-2 tabs by mouth every 6 hours  #60 x 0   Entered and Authorized by:   Mariana Arn  MD   Signed by:   Mariana Arn  MD on 06/09/2010   Method used:   Print then Give to Patient   RxID:   [id number]

## 2011-01-26 NOTE — Progress Notes (Signed)
Summary: resch   Phone Note Call from Patient Call back at Home Phone [phone]   Caller: Patient Summary of Call: pt went to ED this AM and was told they were cancelling her appt for today and resch for Thurs.  - they have medicated her so she would not be able to come today. Initial call taken by: Audie Clear,  January 12, 2010 9:45 AM

## 2011-01-26 NOTE — Assessment & Plan Note (Signed)
Summary: intractable vomiting; tcb   Vital Signs:  Patient profile:   45 year old female Height:      61.5 inches Weight:      162 pounds BMI:     30.22 BSA:     1.74 Temp:     98.2 degrees F Pulse rate:   78 / minute BP sitting:   180 / 89  Vitals Entered By: Christen Bame CMA (January 15, 2010 11:07 AM) CC: f/u vomiting Is Patient Diabetic? No Pain Assessment Patient in pain? yes     Location: [place] Intensity: 9   Primary Care Provider:  Mariana Arn  MD  CC:  f/u vomiting.  History of Present Illness: 1) Cyclic vomiting: Deteriorated. Ran out of syringes for phenergan this week, has been having intractable emesis and moderate abdominal pain as per usual when untreated. Denies presyncope, hematemesis, bilious emesis, melena, hematochezia, fever, dysuria, chest pain, dyspnea. ER visit last night and on Monday of this week; discharged from ER both times after nausea controlled. Unable to keep her by mouth medications down, has misssed at least one dose of her Lantus during this time as well.      Habits & Providers  Alcohol-Tobacco-Diet     Tobacco Status: current     Tobacco Counseling: to quit use of tobacco products     Cigarette Packs/Day: 1/3 ppd  Current Medications (verified): 1)  Bayer Childrens Aspirin 81 Mg Chew (Aspirin) .... Take 1 Tablet By Mouth Once A Day 2)  Lorazepam 1 Mg Tabs (Lorazepam) .... Take 1 Tablet Every 6 Hours As Needed For Nausea 3)  Promethazine Hcl 25 Mg  Tabs (Promethazine Hcl) .... Take 1 Tablet Every 4 Hours At First Sign of Nausea 4)  Lantus 100 Unit/ml Soln (Insulin Glargine) .... Take 22 Units Subcutaneously At Bedtime. Dispense One Month Supply. 5)  Miralax   Powd (Polyethylene Glycol 3350) .... Mix Packet With Water Once Daily As Needed For Constipation 6)  Phenadoz 25 Mg  Supp (Promethazine Hcl) .... Insert 1 Every 6 Hours As Needed 7)  Promethazine Hcl 25 Mg/ml  Soln (Promethazine Hcl) .... Inject 25 Mg ( 1 Ml) Im Q 6 Hours  Prn 8)  Im Injection Supplies .... For Use With Phenergan Injections Qs For Thirty Injections 9)  Metformin Hcl 1000 Mg  Tabs (Metformin Hcl) .Marland Kitchen.. 1 Tab By Mouth Two Times A Day For Diabetes 10)  Hydrocodone-Acetaminophen 5-500 Mg Tabs (Hydrocodone-Acetaminophen) .Marland Kitchen.. 1-2 Tabs By Mouth Every 6 Hours 11)  Coreg 25 Mg Tabs (Carvedilol) .Marland Kitchen.. 1 Tablet By Mouth Two Times A Day 12)  Prilosec 20 Mg Cpdr (Omeprazole) .Marland Kitchen.. 1 Tablet By Mouth Daily 13)  Mirtazapine 15 Mg Tabs (Mirtazapine) .... Take One Tablet Daily At Bedtime  Allergies: 1)  ! Percocet 2)  ! Erythromycin  Social History: Packs/Day:  1/3 ppd  Physical Exam  General:  appears not feeling well but NAD  Mouth:  Oral mucosa and oropharynx without lesions or exudates.  somewhat dry mucus membranes  Lungs:  Normal respiratory effort, chest expands symmetrically. Lungs are clear to auscultation, No crackles Heart:  Normal rate and regular rhythm. S1 and S2 normal without gallop, murmur, click, rub or other extra sounds Abdomen:  positive BS, no distension, soft, mild tender to palpation mostly epigastric region.  No guarding or rebound. Pulses:  2+ periph pulses Neurologic:  alert & oriented X3 and cranial nerves grossly II-XII intact.  alert & oriented X3, cranial nerves II-XII intact, and strength and sensation  normal in all extremities.   Skin:  turgor normal.   Psych:  Cognition and judgment appear intact. Alert and cooperative with normal attention span and concentration. No apparent delusions, illusions, hallucinations.  Denies SI/HI.    Impression & Recommendations:  Problem # 1:  PERSISTENT VOMITING (ICD-536.2) Assessment Deteriorated  Continue medications as above for control with goal to reduce hospital visits. Will give phenergan, morphine IM now to break cycle. Refill syringes for phenergan. Will see next week to follow up and to discuss DM2, HTN, chronic pain issues at that time.  Complete Medication List: 1)  Bayer  Childrens Aspirin 81 Mg Chew (Aspirin) .... Take 1 tablet by mouth once a day 2)  Lorazepam 1 Mg Tabs (Lorazepam) .... Take 1 tablet every 6 hours as needed for nausea 3)  Promethazine Hcl 25 Mg Tabs (Promethazine hcl) .... Take 1 tablet every 4 hours at first sign of nausea 4)  Lantus 100 Unit/ml Soln (Insulin glargine) .... Take 22 units subcutaneously at bedtime. dispense one month supply. 5)  Miralax Powd (Polyethylene glycol 3350) .... Mix packet with water once daily as needed for constipation 6)  Phenadoz 25 Mg Supp (Promethazine hcl) .... Insert 1 every 6 hours as needed 7)  Promethazine Hcl 25 Mg/ml Soln (Promethazine hcl) .... Inject 25 mg ( 1 ml) im q 6 hours prn 8)  Im Injection Supplies  .... For use with phenergan injections qs for thirty injections 9)  Metformin Hcl 1000 Mg Tabs (Metformin hcl) .Marland Kitchen.. 1 tab by mouth two times a day for diabetes 10)  Hydrocodone-acetaminophen 5-500 Mg Tabs (Hydrocodone-acetaminophen) .Marland Kitchen.. 1-2 tabs by mouth every 6 hours 11)  Coreg 25 Mg Tabs (Carvedilol) .Marland Kitchen.. 1 tablet by mouth two times a day 12)  Prilosec 20 Mg Cpdr (Omeprazole) .Marland Kitchen.. 1 tablet by mouth daily 13)  Mirtazapine 15 Mg Tabs (Mirtazapine) .... Take one tablet daily at bedtime  Other Orders: Promethazine up to 50mg  (J2550) Morphine Sulfate inj 10 mg (J2270) [facility]- Est Level  3 [id number])  Patient Instructions: 1)  Follow up in one week to discuss your diabetes, HTN, and pain.  2)  Continue to take all your medications.  Prescriptions: PROMETHAZINE HCL 25 MG/ML  SOLN (PROMETHAZINE HCL) Inject 25 mg ( 1 mL) IM q 6 hours PRN  #30 x 2   Entered and Authorized by:   Mariana Arn  MD   Signed by:   Mariana Arn  MD on 01/15/2010   Method used:   Faxed to ...       [facility] (mail-order)       [address] [place], [place]  [address]       Ph: [phone]       Fax: [phone]   RxID:   [id number] IM INJECTION SUPPLIES for use with phenergan injections QS for thirty  injections  #1 x 3   Entered and Authorized by:   Mariana Arn  MD   Signed by:   Mariana Arn  MD on 01/15/2010   Method used:   Faxed to ...       [facility] (mail-order)       [place],   [id number]       Ph: [phone]       Fax: [phone]   RxID:   [id number]    Medication Administration  Injection # 1:    Medication: Promethazine up to  50mg     Diagnosis: PERSISTENT VOMITING (ICD-536.2)    Route: IM    Site: LUOQ gluteus    Exp Date: 09/27/2011    Lot #: [id number]    Mfr: Novaplus    Comments: Patient recieved 25mg  of Phenergan    Patient tolerated injection without complications    Given by: Levert Feinstein LPN (January 20, 624THL 11:37 AM)  Injection # 2:    Medication: Morphine Sulfate inj 10 mg    Diagnosis: PERSISTENT VOMITING (ICD-536.2)    Route: IM    Site: LUOQ gluteus    Exp Date: 07/27/2010    Lot #: [id number]    Mfr: hospira    Comments: 2mg  IM given    Patient tolerated injection without complications    Given by: Christen Bame CMA (January 15, 2010 11:49 AM)  Orders Added: 1)  Promethazine up to 50mg  [J2550] 2)  Morphine Sulfate inj 10 mg [J2270] 3)  [facility]- Est Level  3 [id number]    Medication Administration  Injection # 1:    Medication: Promethazine up to 50mg     Diagnosis: PERSISTENT VOMITING (ICD-536.2)    Route: IM    Site: LUOQ gluteus    Exp Date: 09/27/2011    Lot #: [id number]    Mfr: Novaplus    Comments: Patient recieved 25mg  of Phenergan    Patient tolerated injection without complications    Given by: Levert Feinstein LPN (January 20, 624THL 11:37 AM)  Injection # 2:    Medication: Morphine Sulfate inj 10 mg    Diagnosis: PERSISTENT VOMITING (ICD-536.2)    Route: IM    Site: LUOQ gluteus    Exp Date: 07/27/2010    Lot #: [id number]    Mfr: hospira    Comments: 2mg  IM given    Patient tolerated injection without complications    Given by: Christen Bame CMA (January 15, 2010 11:49 AM)  Orders  Added: 1)  Promethazine up to 50mg  [J2550] 2)  Morphine Sulfate inj 10 mg [J2270] 3)  [place] [facility]- Est Level  3 [id number]

## 2011-01-26 NOTE — Assessment & Plan Note (Signed)
Summary: hfu,df   Vital Signs:  Patient profile:   45 year old female Weight:      163.2 pounds BMI:     30.45 Temp:     98.2 degrees F Pulse rate:   69 / minute BP sitting:   158 / 65  (left arm)  Vitals Entered By: Geanie Cooley RN (April 21, 2010 10:39 AM) CC: hosp f/u for cyclic vomiting Is Patient Diabetic? Yes Pain Assessment Patient in pain? no        Primary Care Provider:  Mariana Arn  MD  CC:  hosp f/u for cyclic vomiting.  History of Present Illness: 1) Cyclic emesis: Hospital follow up (3/13 - 3/15). Admitted with usual abdominal pain, intractable nausea, dehydration (see H+P). Diet was advanced after control of symptoms with IV anti-emetics, pain medications and benzodiazepines. Was doing well post d/c, but today starting to have episode of nausea. Does not need refills on any of her medications for her cyclic emesis. Has not taken any of her medications for her cyclic emesis today. Denies emesis, diarrhea, abdominal pain since discharge. Also had some constipation which she has chronically.   2) HTN: BP today 158 / 65. Started on Norvasc 10 mg on discharge but has not filled script yet. Denies chest pain, dyspnea, LE edema. Also on Coreg. Cr. 1.61 on discharge from hospital. Has been on an ACE-I in the past but was discontinued secondary to elevated Cr during hospitalization about two years ago and not restarted by her previous PCP.   3) DM2: A1C in hospital = 10.4. Was 9.2 in November 2010. Denies polyuria, polydipsia. Still does not have her meter - this time "the battery ran low", so does not check sugars.    Habits & Providers  Alcohol-Tobacco-Diet     Tobacco Status: current     Cigarette Packs/Day: 0.25  Allergies: 1)  ! Percocet 2)  ! Erythromycin  Physical Exam  General:  NAD, appears slightly uncomfortable  Mouth:  moist membranes  Lungs:  Normal respiratory effort, chest expands symmetrically. Lungs are clear to auscultation, no crackles or  wheezes. Heart:  RRR no murmurs  Abdomen:  soft.  ttp in the epigastrium. + BS; no rigidity or guarding.   Neurologic:  alert & oriented X3 and cranial nerves II-XII intact.     Impression & Recommendations:  Problem # 1:  PERSISTENT VOMITING (ICD-536.2)  Advised to use Ativan as firstline abortive therapy for episodes. Will continue anti-emetics as well. Will have patient start bowel regimen as it appears that some of her episodes may be triggered by her chronic constipation. Follow up one month.   Orders: [facility]- Est  Level 4 [id number])  Problem # 2:  DIABETES MELLITUS, II, COMPLICATIONS (A999333)  Will have patient start taking Lantus in AM, increase by one unit per day for CBG > 110. A1C from last hospitalization = . Continue metformin. Reviewed diet and exercise. Follow up one month.   Her updated medication list for this problem includes:    Bayer Childrens Aspirin 81 Mg Chew (Aspirin) .Marland Kitchen... Take 1 tablet by mouth once a day    Lantus 100 Unit/ml Soln (Insulin glargine) .Marland Kitchen... Take 22 units subcutaneously each morning. increase by one unit a day for each fasting blood glucose over 110. dispense one month supply.    Metformin Hcl 1000 Mg Tabs (Metformin hcl) .Marland Kitchen... 1 tab by mouth two times a day for diabetes  Orders: [facility]- Est  Level 4 [id number])  Problem # 3:  HYPERTENSION, BENIGN SYSTEMIC (ICD-401.1)  Started on Norvasc as below. Not at goal. Reviewed DASH diet and exercise. Consider restart ACE-I if Cr. will allow.  Her updated medication list for this problem includes:    Coreg 25 Mg Tabs (Carvedilol) .Marland Kitchen... 1 tablet by mouth two times a day    Amlodipine Besylate 10 Mg Tabs (Amlodipine besylate) ..... One tab po qday  Orders: [facility]- Est  Level 4 [id number])  Complete Medication List: 1)  Bayer Childrens Aspirin 81 Mg Chew (Aspirin) .... Take 1 tablet by mouth once a day 2)  Lorazepam 1 Mg Tabs (Lorazepam) .... Take 1 tablet every 6 hours as needed for nausea 3)  Promethazine Hcl 25  Mg Tabs (Promethazine hcl) .... Take 1 tablet every 4 hours at first sign of nausea 4)  Lantus 100 Unit/ml Soln (Insulin glargine) .... Take 22 units subcutaneously each morning. increase by one unit a day for each fasting blood glucose over 110. dispense one month supply. 5)  Miralax Powd (Polyethylene glycol 3350) .... Mix packet with water once daily as needed for constipation 6)  Phenadoz 25 Mg Supp (Promethazine hcl) .... Insert 1 every 6 hours as needed 7)  Promethazine Hcl 25 Mg/ml Soln (Promethazine hcl) .... Inject 25 mg ( 1 ml) im q 6 hours prn 8)  Im Injection Supplies  .... For use with phenergan injections qs for thirty injections 9)  Metformin Hcl 1000 Mg Tabs (Metformin hcl) .Marland Kitchen.. 1 tab by mouth two times a day for diabetes 10)  Hydrocodone-acetaminophen 5-500 Mg Tabs (Hydrocodone-acetaminophen) .Marland Kitchen.. 1-2 tabs by mouth every 6 hours 11)  Coreg 25 Mg Tabs (Carvedilol) .Marland Kitchen.. 1 tablet by mouth two times a day 12)  Prilosec 20 Mg Cpdr (Omeprazole) .Marland Kitchen.. 1 tablet by mouth daily 13)  Mirtazapine 30 Mg Tabs (Mirtazapine) .... One tab by mouth at bedtime 14)  Diabetic Shoes  15)  Tussionex Pennkinetic Er 8-10 Mg/62ml Lqcr (Chlorpheniramine-hydrocodone) .... 5 ml by mouth q 12 hours as needed for cough. disp 50 ml 16)  Flexeril 5 Mg Tabs (Cyclobenzaprine hcl) .... One tab by mouth three times a day 17)  Flagyl 500 Mg Tabs (Metronidazole) .... One tab by mouth two times a day x 7 days 18)  Amlodipine Besylate 10 Mg Tabs (Amlodipine besylate) .... One tab po qday 19)  Epipen 0.3 Mg/0.61ml Devi (Epinephrine) .... Give one dose im as needed for severe anaphylaxis (see instructions on use)  Patient Instructions: 1)  It was great to see you today!  2)  Follow up in one month. 3)  Take your Ativan as soon as you feel any epsiodes of nausea starting, then take your medicines to help with nausea.  4)  Start taking amlodipine 10 mg daily. 5)  Increase your Lantus by one unit each day if your fasting  blood sugar is over 110.  6)  Start taking your lantus in the morning.  Prescriptions: EPIPEN 0.3 MG/0.3ML DEVI (EPINEPHRINE) Give one dose IM as needed for severe anaphylaxis (see instructions on use)  #1 x 0   Entered and Authorized by:   Mariana Arn  MD   Signed by:   Mariana Arn  MD on 04/21/2010   Method used:   Printed then faxed to ...         [id number] LANTUS 100 UNIT/ML SOLN (INSULIN GLARGINE) Take 22 units Subcutaneously each morning. Increase by one unit a day for each fasting blood glucose over 110. Dispense one month supply.  #1  x 3   Entered and Authorized by:   Mariana Arn  MD   Signed by:   Mariana Arn  MD on 04/21/2010   Method used:   Printed then faxed to ...         [id number] AMLODIPINE BESYLATE 10 MG TABS (AMLODIPINE BESYLATE) one tab PO qday  #30 x 3   Entered and Authorized by:   Mariana Arn  MD   Signed by:   Mariana Arn  MD on 04/21/2010   Method used:   Printed then faxed to ...         [id number]

## 2011-01-26 NOTE — Miscellaneous (Signed)
Summary: c/o stomach pain   Clinical Lists Changes states her stomach is very painful. HX of this. put on schedule. will be here at 11 if she can fiond a ride now or at 1:30 if unable to get a ride before lunch. She is crying.Elige Radon RN  March 31, 2010 11:00 AM  Reviewed clinic note from Dr. Morrison Old  MD  April 01, 2010 8:39 AM

## 2011-01-26 NOTE — Consult Note (Signed)
Summary: [facility]   Imported By: Raymond Gurney 06/12/2010 15:36:22  _____________________________________________________________________  External Attachment:    Type:   Image     Comment:   External Document  Appended Document: [facility]     Clinical Lists Changes  Medications: Rx of AMLODIPINE BESYLATE 10 MG TABS (AMLODIPINE BESYLATE) one tab PO qday;  #30 x 3;  Signed;  Entered by: Mariana Arn  MD;  Authorized by: Mariana Arn  MD;  Method used: Faxed to [facility], [address], [place], [place]  [address], Ph: [phone], Fax: [phone] Rx of EPIPEN 0.3 MG/0.3ML DEVI (EPINEPHRINE) Give one dose IM as needed for severe anaphylaxis (see instructions on use);  #1 x 0;  Signed;  Entered by: Mariana Arn  MD;  Authorized by: Mariana Arn  MD;  Method used: Faxed to [facility], [address] [place],  [place]  [address], Ph: [phone], Fax: [phone] Observations: Added new observation of DMEYEEXAMNXT: 06/02/2011 (06/12/2010 16:07) Added new observation of DBT EY CK DT: 06/01/2010 (06/01/2010 16:10) Added new observation of DIAB EYE EX: no retniopathy (cataracts - surgery recommended R > L) (06/01/2010 16:10)    Prescriptions: EPIPEN 0.3 MG/0.3ML DEVI (EPINEPHRINE) Give one dose IM as needed for severe anaphylaxis (see instructions on use)  #1 x 0   Entered and Authorized by:   Mariana Arn  MD   Signed by:   Mariana Arn  MD on 06/12/2010   Method used:   Faxed to ...       [facility] (mail-order)       [place], [place]  [address]       Ph: [phone]       Fax: [phone]   RxID:   [id number] AMLODIPINE BESYLATE 10 MG TABS (AMLODIPINE BESYLATE) one tab PO qday  #30 x 3   Entered and Authorized by:   Mariana Arn  MD   Signed by:   Mariana Arn  MD on 06/12/2010   Method used:   Faxed to ...       [facility] (mail-order)       [place], [place]  [address]   Ph: [phone]       Fax: [phone]   RxID:   [id number]    Diabetic Eye Exam Date:  06/01/2010 Diabetes Eye Exam Result:  no retniopathy (cataracts - surgery recommended R > L) Diabetes Eye Exam Due:  1 yr

## 2011-01-26 NOTE — Miscellaneous (Signed)
Summary: [facility] sent her to ED   Clinical Lists Changes went to UC who sent her to ED. she was there for several hours & they sent her back home. states she is worse & wants to be admitted. we have no appts here. advised return to ED. she agreed.Elige Radon RN  October 02, 2010 10:32 AM

## 2011-01-26 NOTE — Assessment & Plan Note (Signed)
Summary: n&v (see hx)[place]/carew   Vital Signs:  Patient profile:   45 year old female Weight:      159 pounds Temp:     98 degrees F oral Pulse rate:   100 / minute BP sitting:   180 / 90  (left arm)  Vitals Entered By: Gerrit Heck slade,cma CC: see notes. vomitting and stomach pain. Is Patient Diabetic? Yes Pain Assessment Patient in pain? yes     Location: stomach Intensity: 10 Onset of pain  off and on x 1 week.   Primary Care Provider:  Mariana Arn  MD  CC:  see notes. vomitting and stomach pain.Marland Kitchen  History of Present Illness: 1.  nausea, vomitting and abdominal pain--patient with cyclic vomitting has had one week of nausea, vomitting and epigastric pain.  seen in clinic on 4/5, given morphine and phenergen.  Felt a little better.  Then 4 days later, symptoms worsened again and she went to the ER (4/9).  Felt slighly improved yesterday, but awakened again with severe symptoms this morning.  has been taking her home regimen of phenergen with no relief.  Was able to keep some food down yesterday.  Is making urine.  Cannot remember when last bm was--sometime last week.  has not been checking blood sugars because does not have a battery in her glucometer.  did not take her lantus last night.  can't keep any of her oral meds down.    Habits & Providers  Alcohol-Tobacco-Diet     Tobacco Status: current     Tobacco Counseling: to quit use of tobacco products  Current Medications (verified): 1)  Bayer Childrens Aspirin 81 Mg Chew (Aspirin) .... Take 1 Tablet By Mouth Once A Day 2)  Lorazepam 1 Mg Tabs (Lorazepam) .... Take 1 Tablet Every 6 Hours As Needed For Nausea 3)  Promethazine Hcl 25 Mg  Tabs (Promethazine Hcl) .... Take 1 Tablet Every 4 Hours At First Sign of Nausea 4)  Lantus 100 Unit/ml Soln (Insulin Glargine) .... Take 22 Units Subcutaneously At Bedtime. Dispense One Month Supply. 5)  Miralax   Powd (Polyethylene Glycol 3350) .... Mix Packet With Water Once Daily As Needed For  Constipation 6)  Phenadoz 25 Mg  Supp (Promethazine Hcl) .... Insert 1 Every 6 Hours As Needed 7)  Promethazine Hcl 25 Mg/ml  Soln (Promethazine Hcl) .... Inject 25 Mg ( 1 Ml) Im Q 6 Hours Prn 8)  Im Injection Supplies .... For Use With Phenergan Injections Qs For Thirty Injections 9)  Metformin Hcl 1000 Mg  Tabs (Metformin Hcl) .Marland Kitchen.. 1 Tab By Mouth Two Times A Day For Diabetes 10)  Hydrocodone-Acetaminophen 5-500 Mg Tabs (Hydrocodone-Acetaminophen) .Marland Kitchen.. 1-2 Tabs By Mouth Every 6 Hours 11)  Coreg 25 Mg Tabs (Carvedilol) .Marland Kitchen.. 1 Tablet By Mouth Two Times A Day 12)  Prilosec 20 Mg Cpdr (Omeprazole) .Marland Kitchen.. 1 Tablet By Mouth Daily 13)  Mirtazapine 30 Mg Tabs (Mirtazapine) .... One Tab By Mouth At Bedtime 14)  Diabetic Shoes 15)  Tussionex Pennkinetic Er 8-10 Mg/53ml Lqcr (Chlorpheniramine-Hydrocodone) .... 5 Ml By Mouth Q 12 Hours As Needed For Cough. Disp 50 Ml 16)  Flexeril 5 Mg Tabs (Cyclobenzaprine Hcl) .... One Tab By Mouth Three Times A Day 17)  Flagyl 500 Mg Tabs (Metronidazole) .... One Tab By Mouth Two Times A Day X 7 Days  Allergies: 1)  ! Percocet 2)  ! Erythromycin  Past History:  Past Medical History: Reviewed history from 06/05/2009 and no changes required. Cyclic Vomiting Syndrome/Abdominal  migraines with multiple hospital admissions since 2005 (12 admissions in 2008, 4 admissions in 2009). -- UGI (5/03)-> hiatal hernia, mild duodenitis --Normal Gastric Emptying study 6/08 -- EGD by Dr. Henrene Pastor (12/2003) -> esophageal inflammation, ulcerations, hiatal hernia --EGD by Dr. Lajoyce Corners (2/08)-> thickened gastric fold in fundus -> bx showed chronic gastritis, no h pylori, no metaplasia -- evaluated at [facility] (04/2009)- had repeat gastric emptying study that was normal.  recurrent UTIs --[facility] hospitalization in 12/02, 10/04  cardiac cath (8/08)- Dr. Shelva Majestic- normal EF, normal coronaries  Past Surgical History: Reviewed history from 09/25/2008 and no changes  required. Cholecystectomy, ex lap - 01/20/2001 [id number] (3 TAB) 3 c/s - 01/20/2001 tah, right salpingo-ophorectomy for pelvic pain and R tubo-ovarian abscess - 03/13/2002 small bowel obstruction with lysis of adhesions- 03/21/02 central venous port placed- 07/2005  Family History: Reviewed history from 05/12/2009 and no changes required. Diabetes 1st degree, Mother died of MI in 05-Apr-2023 - pt very sad about this Pt also with 3 of 4 siblings with HTN.  No family hx migraines or cyclic vomiting Family History of Colon Cancer:Cousin  Social History: Reviewed history from 06/05/2009 and no changes required. Monogamous; four children; twin sons, son , daughter all in their 28s.  States she lives 'pretty much' alone;  10th grade education; denies physical abuse; denies etoh use.  Admits to [facility] use.  Smokes 1 pack q 3 days.  Brother died in winter when she was 48 - she gets depressed every year in winter (?SAD).  Suicide attempt winter (2003).  Working at [employer] - under significant financial stress due to decreased hours at [employer]- only working 6 hours/week.  Review of Systems       complains of mild dysuria denies chest pain, dyspnea, melena, hemoptysism, fever.    Physical Exam  General:  wretching, clearly in pain Head:  beads of sweat on face Eyes:  normal appearance Mouth:  o/p clear Neck:  No deformities, masses, or tenderness noted. Lungs:  Normal respiratory effort, chest expands symmetrically. Lungs are clear to auscultation, no crackles or wheezes. Heart:  Tachy otherwise regular  rhythm.no murmur.  cap refill about 2 seconds Abdomen:  soft.  ttp in the epigastrium.  hypoactive bowel sounds.  no rigidity or guarding.   Pulses:  2+ dp pulses Extremities:  no edema.  several bunions on left foot. Neurologic:  no focal deficits Skin:  turgor normal.  turgor normal.   Additional Exam:  vital signs reviewed.  bp elevated   Impression & Recommendations:  Problem # 1:  PERSISTENT  VOMITING (ICD-536.2) Assessment Deteriorated will give her usual hospital regimen to try to break the cycle.  scheduled morphine, scheduled zofran and phenergen.  scheduled ativan.  aggressive hydration with D5 1/2 NS.  check acute abd series.  will also check cardiac enzymes in case this is an anginal equivalent.  check lipase, cmet, cbc.  high dose ppi  Problem # 2:  DIABETES MELLITUS, II, COMPLICATIONS (A999333) Assessment: Unchanged give half of her usual lantus dose.  moderate SSI.  hold metformin Her updated medication list for this problem includes:    Bayer Childrens Aspirin 81 Mg Chew (Aspirin) .Marland Kitchen... Take 1 tablet by mouth once a day    Lantus 100 Unit/ml Soln (Insulin glargine) .Marland Kitchen... Take 22 units subcutaneously at bedtime. dispense one month supply.    Metformin Hcl 1000 Mg Tabs (Metformin hcl) .Marland Kitchen... 1 tab by mouth two times a day for diabetes  Problem # 3:  HYPERTENSION,  BENIGN SYSTEMIC (ICD-401.1) Assessment: Deteriorated high today.  as needed metoprolol for SBP >180 or DBP >100.  can restart coreg when she can take PO Her updated medication list for this problem includes:    Coreg 25 Mg Tabs (Carvedilol) .Marland Kitchen... 1 tablet by mouth two times a day  Problem # 4:  TOBACCO DEPENDENCE (ICD-305.1) Assessment: Unchanged smoking cessation consult  Problem # 5:  fengi NPO for now.  D5 1/2 NS for aggressive hydration.  advance diet as symptoms permit  Problem # 6:  prophylaxis high dose ppi and heparin tid  Complete Medication List: 1)  Bayer Childrens Aspirin 81 Mg Chew (Aspirin) .... Take 1 tablet by mouth once a day 2)  Lorazepam 1 Mg Tabs (Lorazepam) .... Take 1 tablet every 6 hours as needed for nausea 3)  Promethazine Hcl 25 Mg Tabs (Promethazine hcl) .... Take 1 tablet every 4 hours at first sign of nausea 4)  Lantus 100 Unit/ml Soln (Insulin glargine) .... Take 22 units subcutaneously at bedtime. dispense one month supply. 5)  Miralax Powd (Polyethylene glycol 3350) ....  Mix packet with water once daily as needed for constipation 6)  Phenadoz 25 Mg Supp (Promethazine hcl) .... Insert 1 every 6 hours as needed 7)  Promethazine Hcl 25 Mg/ml Soln (Promethazine hcl) .... Inject 25 mg ( 1 ml) im q 6 hours prn 8)  Im Injection Supplies  .... For use with phenergan injections qs for thirty injections 9)  Metformin Hcl 1000 Mg Tabs (Metformin hcl) .Marland Kitchen.. 1 tab by mouth two times a day for diabetes 10)  Hydrocodone-acetaminophen 5-500 Mg Tabs (Hydrocodone-acetaminophen) .Marland Kitchen.. 1-2 tabs by mouth every 6 hours 11)  Coreg 25 Mg Tabs (Carvedilol) .Marland Kitchen.. 1 tablet by mouth two times a day 12)  Prilosec 20 Mg Cpdr (Omeprazole) .Marland Kitchen.. 1 tablet by mouth daily 13)  Mirtazapine 30 Mg Tabs (Mirtazapine) .... One tab by mouth at bedtime 14)  Diabetic Shoes  15)  Tussionex Pennkinetic Er 8-10 Mg/25ml Lqcr (Chlorpheniramine-hydrocodone) .... 5 ml by mouth q 12 hours as needed for cough. disp 50 ml 16)  Flexeril 5 Mg Tabs (Cyclobenzaprine hcl) .... One tab by mouth three times a day 17)  Flagyl 500 Mg Tabs (Metronidazole) .... One tab by mouth two times a day x 7 days  Appended Document: n&v (see hx)[place]/carew this is a hospital admission  Appended Document: n&v (see hx)[place]/carew     Allergies: 1)  ! Percocet 2)  ! Erythromycin   Complete Medication List: 1)  Bayer Childrens Aspirin 81 Mg Chew (Aspirin) .... Take 1 tablet by mouth once a day 2)  Lorazepam 1 Mg Tabs (Lorazepam) .... Take 1 tablet every 6 hours as needed for nausea 3)  Promethazine Hcl 25 Mg Tabs (Promethazine hcl) .... Take 1 tablet every 4 hours at first sign of nausea 4)  Lantus 100 Unit/ml Soln (Insulin glargine) .... Take 22 units subcutaneously at bedtime. dispense one month supply. 5)  Miralax Powd (Polyethylene glycol 3350) .... Mix packet with water once daily as needed for constipation 6)  Phenadoz 25 Mg Supp (Promethazine hcl) .... Insert 1 every 6 hours as needed 7)  Promethazine Hcl 25 Mg/ml Soln  (Promethazine hcl) .... Inject 25 mg ( 1 ml) im q 6 hours prn 8)  Im Injection Supplies  .... For use with phenergan injections qs for thirty injections 9)  Metformin Hcl 1000 Mg Tabs (Metformin hcl) .Marland Kitchen.. 1 tab by mouth two times a day for diabetes 10)  Hydrocodone-acetaminophen 5-500  Mg Tabs (Hydrocodone-acetaminophen) .Marland Kitchen.. 1-2 tabs by mouth every 6 hours 11)  Coreg 25 Mg Tabs (Carvedilol) .Marland Kitchen.. 1 tablet by mouth two times a day 12)  Prilosec 20 Mg Cpdr (Omeprazole) .Marland Kitchen.. 1 tablet by mouth daily 13)  Mirtazapine 30 Mg Tabs (Mirtazapine) .... One tab by mouth at bedtime 14)  Diabetic Shoes  15)  Tussionex Pennkinetic Er 8-10 Mg/61ml Lqcr (Chlorpheniramine-hydrocodone) .... 5 ml by mouth q 12 hours as needed for cough. disp 50 ml 16)  Flexeril 5 Mg Tabs (Cyclobenzaprine hcl) .... One tab by mouth three times a day 17)  Flagyl 500 Mg Tabs (Metronidazole) .... One tab by mouth two times a day x 7 days  Other Orders: Morphine Sulfate inj 10 mg (J2270) Promethazine up to 50mg  (J2550)    Medication Administration  Injection # 1:    Medication: Morphine Sulfate inj 10 mg    Diagnosis: ABDOMINAL PAIN (ICD-789.00)    Route: IM    Site: RUOQ gluteus    Exp Date: 07/28/2011    Lot #: [id number]    Mfr: [email or web address]    Comments: 5mg  given    Patient tolerated injection without complications    Given by: Schuyler Amor CMA (April 06, 2010 12:14 PM)  Injection # 2:    Medication: Promethazine up to 50mg     Diagnosis: PERSISTENT VOMITING (ICD-536.2)    Route: IM    Site: RUOQ gluteus    Exp Date: 11/2011    Lot #: [id number]    Mfr: [email or web address]    Comments: 25mg /30ml given    Patient tolerated injection without complications    Given by: Schuyler Amor CMA (April 06, 2010 12:16 PM)  Orders Added: 1)  Morphine Sulfate inj 10 mg [J2270] 2)  Promethazine up to 50mg  [J2550]  Appended Document: Orders Update     Clinical Lists Changes  Orders: Added new Test order of Hospital Admit-[facility] (00000) -  Signed

## 2011-01-26 NOTE — Miscellaneous (Signed)
Summary: feeling worse   Clinical Lists Changes called in tears stating she feels worse. did get & take meds but cannot stop vomiting. asked her to come in now to see pcp (he is the work in [place]) states she has no ride. she will call around to see if she can find someone to bring her in.Elige Radon RN  October 01, 2010 8:46 AM

## 2011-01-26 NOTE — Miscellaneous (Signed)
Summary: Orders Update   Clinical Lists Changes  Problems: Added new problem of ENCOUNTER FOR LONG-TERM USE OF OTHER MEDICATIONS (ICD-V58.69) Orders: Added new Test order of B12-[facility] [phone]) - Signed Added new Test order of CBC-[facility] [id number]) - Signed  Ok per DR. Sherilyn Cooter

## 2011-01-26 NOTE — Progress Notes (Signed)
Summary: triage   Phone Note Call from Patient Call back at [phone]   Caller: Patient Summary of Call: Pt is sick and says she went to ed over weekend and still is sick.  Pt crying wanting to know what is wrong with her. Initial call taken by: Raymond Gurney,  April 06, 2010 9:40 AM  Follow-up for Phone Call        felt a little bit better yesterday. much worse now. she will come now for an appt Follow-up by: Elige Radon RN,  April 06, 2010 10:02 AM  Additional Follow-up for Phone Call Additional follow up Details #1::        Will see as patient is admitted to inpatient service and I am managing resident.  Additional Follow-up by: Mariana Arn  MD,  April 06, 2010 4:32 PM

## 2011-01-26 NOTE — Assessment & Plan Note (Signed)
Summary: cough & emesis/[place]   Vital Signs:  Patient profile:   45 year old female Height:      61.5 inches Weight:      162.3 pounds BMI:     30.28 Temp:     100.2 degrees F oral Pulse rate:   98 / minute BP sitting:   155 / 80  (left arm) Cuff size:   regular  Vitals Entered By: Levert Feinstein LPN (February 16, 624THL 1:40 PM) CC: fever, cough, congestion, pain all over x 2 days Is Patient Diabetic? Yes Did you bring your meter with you today? No Pain Assessment Patient in pain? yes        Primary Care Provider:  Mariana Arn  MD  CC:  fever, cough, congestion, and pain all over x 2 days.  History of Present Illness: 1) Cough: Cough with clear to yellow phlegm, low grade fever to 100.2, myalgia, nausea and vomiting and abdominal pain x 2 days. Denies sick contacts, sore throat. hematemesis, hemoptysis, chest pain, dyspnea, weight loss, diarrhea. Received flu shot this season during last hospitalization. Has not vomited today - has history of cyclic vomiting syndrome. Seen at [facility] and discharged with Ibuprofen, Tussionex (which she cannot afford from regular pharmacy), and Zofran (which does not work for her emesis). Emesis is mainly post-tussive and is clear phlegm.   Current Medications (verified): 1)  Bayer Childrens Aspirin 81 Mg Chew (Aspirin) .... Take 1 Tablet By Mouth Once A Day 2)  Lorazepam 1 Mg Tabs (Lorazepam) .... Take 1 Tablet Every 6 Hours As Needed For Nausea 3)  Promethazine Hcl 25 Mg  Tabs (Promethazine Hcl) .... Take 1 Tablet Every 4 Hours At First Sign of Nausea 4)  Lantus 100 Unit/ml Soln (Insulin Glargine) .... Take 22 Units Subcutaneously At Bedtime. Dispense One Month Supply. 5)  Miralax   Powd (Polyethylene Glycol 3350) .... Mix Packet With Water Once Daily As Needed For Constipation 6)  Phenadoz 25 Mg  Supp (Promethazine Hcl) .... Insert 1 Every 6 Hours As Needed 7)  Promethazine Hcl 25 Mg/ml  Soln (Promethazine Hcl) .... Inject 25 Mg ( 1 Ml) Im Q 6 Hours  Prn 8)  Im Injection Supplies .... For Use With Phenergan Injections Qs For Thirty Injections 9)  Metformin Hcl 1000 Mg  Tabs (Metformin Hcl) .Marland Kitchen.. 1 Tab By Mouth Two Times A Day For Diabetes 10)  Hydrocodone-Acetaminophen 5-500 Mg Tabs (Hydrocodone-Acetaminophen) .Marland Kitchen.. 1-2 Tabs By Mouth Every 6 Hours 11)  Coreg 25 Mg Tabs (Carvedilol) .Marland Kitchen.. 1 Tablet By Mouth Two Times A Day 12)  Prilosec 20 Mg Cpdr (Omeprazole) .Marland Kitchen.. 1 Tablet By Mouth Daily 13)  Mirtazapine 30 Mg Tabs (Mirtazapine) .... One Tab By Mouth At Bedtime 14)  Diabetic Shoes  Allergies (verified): 1)  ! Percocet 2)  ! Erythromycin  Physical Exam  General:  appears ill but NAD  Eyes:  no conjunctivitis  Nose:  nasal congestion w/o rhinorrhea  Mouth:  moist membranes, no erythema or exudate  Neck:  shotty lymphadenopathy   Lungs:  CTAB w/o wheeze or crackles  Heart:  Normal rate and regular rhythm. S1 and S2 normal without gallop, murmur, click, rub or other extra sounds Abdomen:  positive BS, no distension, soft, mild tender to palpation mostly epigastric region.  No guarding or rebound. Pulses:  2+ radials    Impression & Recommendations:  Problem # 1:  URI (ICD-465.9) Assessment New  Likely viral. COnsider flu given symptomatology, but outside treatment window for Tamiflu. Will  treat symptomatically with Tussionex (sent to [place]). Patient to take her Vicodin for pain. Follow up at next appointment 12 days. Red flags reviewed. Control nausea w/ cyclic vomiting, maintain hydration.  Her updated medication list for this problem includes:    Bayer Childrens Aspirin 81 Mg Chew (Aspirin) .Marland Kitchen... Take 1 tablet by mouth once a day    Promethazine Hcl 25 Mg Tabs (Promethazine hcl) .Marland Kitchen... Take 1 tablet every 4 hours at first sign of nausea    Phenadoz 25 Mg Supp (Promethazine hcl) ..... Insert 1 every 6 hours as needed    Promethazine Hcl 25 Mg/ml Soln (Promethazine hcl) ..... Inject 25 mg ( 1 ml) im q 6 hours prn    Tussionex  Pennkinetic Er 8-10 Mg/5ml Lqcr (Chlorpheniramine-hydrocodone) .Marland KitchenMarland KitchenMarland KitchenMarland Kitchen 5 ml by mouth q 12 hours as needed for cough. disp 50 ml  Orders: [place]- Est Level  3 [id number])  Complete Medication List: 1)  Bayer Childrens Aspirin 81 Mg Chew (Aspirin) .... Take 1 tablet by mouth once a day 2)  Lorazepam 1 Mg Tabs (Lorazepam) .... Take 1 tablet every 6 hours as needed for nausea 3)  Promethazine Hcl 25 Mg Tabs (Promethazine hcl) .... Take 1 tablet every 4 hours at first sign of nausea 4)  Lantus 100 Unit/ml Soln (Insulin glargine) .... Take 22 units subcutaneously at bedtime. dispense one month supply. 5)  Miralax Powd (Polyethylene glycol 3350) .... Mix packet with water once daily as needed for constipation 6)  Phenadoz 25 Mg Supp (Promethazine hcl) .... Insert 1 every 6 hours as needed 7)  Promethazine Hcl 25 Mg/ml Soln (Promethazine hcl) .... Inject 25 mg ( 1 ml) im q 6 hours prn 8)  Im Injection Supplies  .... For use with phenergan injections qs for thirty injections 9)  Metformin Hcl 1000 Mg Tabs (Metformin hcl) .Marland Kitchen.. 1 tab by mouth two times a day for diabetes 10)  Hydrocodone-acetaminophen 5-500 Mg Tabs (Hydrocodone-acetaminophen) .Marland Kitchen.. 1-2 tabs by mouth every 6 hours 11)  Coreg 25 Mg Tabs (Carvedilol) .Marland Kitchen.. 1 tablet by mouth two times a day 12)  Prilosec 20 Mg Cpdr (Omeprazole) .Marland Kitchen.. 1 tablet by mouth daily 13)  Mirtazapine 30 Mg Tabs (Mirtazapine) .... One tab by mouth at bedtime 14)  Diabetic Shoes  15)  Tussionex Pennkinetic Er 8-10 Mg/59ml Lqcr (Chlorpheniramine-hydrocodone) .... 5 ml by mouth q 12 hours as needed for cough. disp 50 ml  Patient Instructions: 1)  It was great to see you today!  2)  Take over the counter cough medication (make sure that the cough medication does not have Sudafed or pseudoephedrine, so that it is safe to use with high blood pressure). It will have a red heart on the box to show that it is safe to use. 3)  If the over the counter cough medicine has acetaminopen or  Tylenol in it, do not give additional Tylenol as these are the same thing, otherwise can give Tylenol for fevers.  4)  Take Vicodin for pain as directed.  5)  Follow up at your regular appointment. Prescriptions: TUSSIONEX PENNKINETIC ER 8-10 MG/5ML LQCR (CHLORPHENIRAMINE-HYDROCODONE) 5 ml by mouth q 12 hours as needed for cough. Disp 50 ml  #50 ml x 0   Entered and Authorized by:   Mariana Arn  MD   Signed by:   Mariana Arn  MD on 02/11/2010   Method used:   Printed then faxed to ...       [facility])  [address]       [place], [place]  [address]       Ph: [phone]       Fax: [phone]   RxID:   [id number]

## 2011-01-26 NOTE — Assessment & Plan Note (Signed)
Summary: hfu,df   Vital Signs:  Patient profile:   45 year old female Height:      61.5 inches Weight:      163.2 pounds BMI:     30.45 Temp:     98.1 degrees F Pulse rate:   73 / minute BP sitting:   139 / 81  Vitals Entered By: Elige Radon RN (March 19, 2010 1:37 PM)  Primary Care Provider:  Mariana Arn  MD  CC:  hosp f/u for abd pain.  History of Present Illness: 1) Hospital follow up: Hospitalized 3/16 through 3/18 for abdominal pain. CT scan, was within normal limits, lipase was mildly elevated. Pain responded to IV rehydration and pain control and was able to tolerate regular diet on discharge. Did not have any episodes of emesis during this, but reports that pain was similar to the pain she has when she has her episodes of emesis. Pain has improved. Denies nausea, emesis, diarrhea, constipation, fever, chills, melena, henamtochezia, dysuria.   2) Back pain: History of chronic lower back pain. Reports symptoms worse over past few weeks, worse with change in weather, walming around, sitting for extended periods. Reports occasional muscle spasm in back. Denies LE weakness, saddle anesthesia, Le numbness or tingling, difficulty with urination or defacation.   3) Vaginitis: Symptoms x past few weeks. Denies discharge, odor, new sexual partner, dysuria, fever, dyspareunia, abnormal bleeding.   Habits & Providers  Alcohol-Tobacco-Diet     Alcohol drinks/day: 0     Tobacco Status: current     Tobacco Counseling: to quit use of tobacco products     Cigarette Packs/Day: 0.5     Diet Comments: 1500 cal & low sodium  Exercise-Depression-Behavior     Does Patient Exercise: no     Have you felt down or hopeless? no     Have you felt little pleasure in things? no     Depression Counseling: further diagnostic testing and/or other treatment is indicated     Drug Use: marijuanna     Drug Use Counseling: yes     Seat Belt Use: always     Sun Exposure: rarely  Comments: depressed  affect. smoke marijuana to help with nausea  Allergies: 1)  ! Percocet 2)  ! Erythromycin  Social History: Packs/Day:  0.5 Seat Belt Use:  always Sun Exposure-Excessive:  rarely Drug Use:  marijuanna Does Patient Exercise:  no  Physical Exam  General:  alert and well-developed.   Lungs:  normal respiratory effort, no accessory muscle use, and normal breath sounds.   Heart:  normal rate, regular rhythm, no murmur, and no gallop.  No JVD, HJR noted. Abdomen:  mild diffuse tenderness to palpation (as per usual), +BS, no rebound or guarding.  Genitalia:  +ve vaginal discharge w/o odor (white) normal introitus, no external lesions, mucosa pink and moist, and no vaginal atrophy.   Msk:  full ROM at lumbar spine  w/ mild to moderate tenderness  negative SLR bilaterally  tender to palpation lumbar paraspinous muscles  negative FABER bilaterally  Neurologic:  alert & oriented X3.  cranial nerves II-XII intact, strength normal in all extremities, sensation intact to light touch, and DTRs symmetrical and normal.     Impression & Recommendations:  Problem # 1:  VAGINAL PRURITUS (ICD-698.1) Assessment New Wet prep c/w bacterial vaginosis - will treat with flagyl x 7 days.  Orders: Urinalysis-[facility] (00000) Wet Prep- [place] [id number]) [place]- Est  Level 4 [id number])  Problem # 2:  BACK PAIN,  CHRONIC (ICD-724.5) Assessment: Unchanged  Flexeril for spasm. Vicodin for pain as before. Exercises given. If not improving would consider pursue imaging. No red flags on history or exam  Her updated medication list for this problem includes:    Bayer Childrens Aspirin 81 Mg Chew (Aspirin) .Marland Kitchen... Take 1 tablet by mouth once a day    Hydrocodone-acetaminophen 5-500 Mg Tabs (Hydrocodone-acetaminophen) .Marland Kitchen... 1-2 tabs by mouth every 6 hours    Flexeril 5 Mg Tabs (Cyclobenzaprine hcl) ..... One tab by mouth three times a day  Orders: [place]- Est  Level 4 [id number])  Problem # 3:  PERSISTENT VOMITING  (ICD-536.2) Assessment: Improved  Likely not pancreatitis as root cause given low lipase. Pain more likely related to history of cyclic emesis. Will continue to follow.   Orders: [facility]- Est  Level 4 [id number])  Complete Medication List: 1)  Bayer Childrens Aspirin 81 Mg Chew (Aspirin) .... Take 1 tablet by mouth once a day 2)  Lorazepam 1 Mg Tabs (Lorazepam) .... Take 1 tablet every 6 hours as needed for nausea 3)  Promethazine Hcl 25 Mg Tabs (Promethazine hcl) .... Take 1 tablet every 4 hours at first sign of nausea 4)  Lantus 100 Unit/ml Soln (Insulin glargine) .... Take 22 units subcutaneously at bedtime. dispense one month supply. 5)  Miralax Powd (Polyethylene glycol 3350) .... Mix packet with water once daily as needed for constipation 6)  Phenadoz 25 Mg Supp (Promethazine hcl) .... Insert 1 every 6 hours as needed 7)  Promethazine Hcl 25 Mg/ml Soln (Promethazine hcl) .... Inject 25 mg ( 1 ml) im q 6 hours prn 8)  Im Injection Supplies  .... For use with phenergan injections qs for thirty injections 9)  Metformin Hcl 1000 Mg Tabs (Metformin hcl) .Marland Kitchen.. 1 tab by mouth two times a day for diabetes 10)  Hydrocodone-acetaminophen 5-500 Mg Tabs (Hydrocodone-acetaminophen) .Marland Kitchen.. 1-2 tabs by mouth every 6 hours 11)  Coreg 25 Mg Tabs (Carvedilol) .Marland Kitchen.. 1 tablet by mouth two times a day 12)  Prilosec 20 Mg Cpdr (Omeprazole) .Marland Kitchen.. 1 tablet by mouth daily 13)  Mirtazapine 30 Mg Tabs (Mirtazapine) .... One tab by mouth at bedtime 14)  Diabetic Shoes  15)  Tussionex Pennkinetic Er 8-10 Mg/34ml Lqcr (Chlorpheniramine-hydrocodone) .... 5 ml by mouth q 12 hours as needed for cough. disp 50 ml 16)  Flexeril 5 Mg Tabs (Cyclobenzaprine hcl) .... One tab by mouth three times a day 17)  Flagyl 500 Mg Tabs (Metronidazole) .... One tab by mouth two times a day x 7 days Prescriptions: FLAGYL 500 MG TABS (METRONIDAZOLE) one tab by mouth two times a day x 7 days  #14 x 0   Entered and Authorized by:   Mariana Arn  MD    Signed by:   Mariana Arn  MD on 03/20/2010   Method used:   Handwritten   [id number] FLEXERIL 5 MG TABS (CYCLOBENZAPRINE HCL) one tab by mouth three times a day  #30 x 0   Entered and Authorized by:   Mariana Arn  MD   Signed by:   Mariana Arn  MD on 03/20/2010   Method used:   Handwritten   [id number]   Laboratory Results   Urine Tests  Date/Time Received: March 19, 2010 2:10 PM  Date/Time Reported: March 19, 2010 2:36 PM   Routine Urinalysis   Color: yellow Appearance: Clear Glucose: 250   (Normal Range: Negative) Bilirubin: negative   (Normal Range: Negative) Ketone: negative   (  Normal Range: Negative) Spec. Gravity: 1.010   (Normal Range: 1.003-1.035) Blood: small   (Normal Range: Negative) pH: 6.0   (Normal Range: 5.0-8.0) Protein: negative   (Normal Range: Negative) Urobilinogen: 0.2   (Normal Range: 0-1) Nitrite: negative   (Normal Range: Negative) Leukocyte Esterace: trace   (Normal Range: Negative)  Urine Microscopic RBC/HPF: 0-2 Bacteria/HPF: trace - cocci Epithelial/HPF: rare    Comments: ...............test performed by......Marland KitchenBonnie A. Martinique, MLS (ASCP)cm  Date/Time Received: March 19, 2010 2:10 PM  Date/Time Reported: March 19, 2010 2:37 PM   [employer] Source: vag WBC/hpf: rare Bacteria/hpf: 3+  cocci and few rods Clue cells/hpf: many  Positive whiff Yeast/hpf: none Trichomonas/hpf: none Comments: ...............test performed by......Marland KitchenBonnie A. Martinique, MLS (ASCP)cm

## 2011-01-26 NOTE — Progress Notes (Signed)
Summary: meds prob   Phone Note Refill Request Call back at [phone] Message from:  Patient  talked to Dr Sherilyn Cooter last week and he was supposed to call Flexeril 10mg  - into [facility]- [phone]   Initial call taken by: Audie Clear,  January 29, 2010 9:43 AM  Follow-up for Phone Call        to pcp as this is not on med list Follow-up by: Elige Radon RN,  January 29, 2010 9:57 AM  Additional Follow-up for Phone Call Additional follow up Details #1::        I will call patient regarding this. No record of this in my notes and no recollection of this.  Additional Follow-up by: Mariana Arn  MD,  January 31, 2010 9:27 AM

## 2011-01-26 NOTE — Assessment & Plan Note (Signed)
Summary: HOSP F/U [facility]   Vital Signs:  Patient profile:   45 year old female Weight:      174 pounds Temp:     97.9 degrees F oral Pulse rate:   64 / minute Pulse rhythm:   regular BP sitting:   162 / 90  (left arm) Cuff size:   regular  Vitals Entered By: Audelia Hives CMA (October 19, 2010 8:57 AM) CC: hospital follow up Is Patient Diabetic? Yes Did you bring your meter with you today? No Comments pt states that she is still feeling nauseated and something is keeping her sick.  she called in a few weeks ago to get an appt and there were no appt so she went to [facility] and they transferred her to [facility].  she feels that if we would have seen her when she began having these problems she would'nt be as sick as she is.     Primary Care Provider:  Mariana Arn  MD  CC:  hospital follow up.  History of Present Illness: 1) Cyclic emesis: Hospitalized [id number] for cyclic vomiting - patient was started on her usual IV and gradually responded. (Phenergan, Zofran, and Compazine) - at discharge she denied nausea and was doing well. Today she reports that she has been nauseated since leaving the hospital but has not thrown up. She is using her home medications with some relief. Plan to follow up at [facility] - needs to meet with case worker this week to have transportation arranged as this has been an issuefor her.  2) Urinary tract infection.  Noted to have UTI while in hospital, started on CTX, cx grew  pan-sens E coli. Patient was switched to Keflex upon discharge- she states that this makes her nauseated.   3) Vaginal discharge: Sexually active since discharge from hospital. Did not use protection. Clear discharge w/o odor. No dyspareunia, vaginitis or pelvic pain.   ROS: Denies dysuria, hematuria, flanks pain, fever, chills.       Habits & Providers  Alcohol-Tobacco-Diet     Alcohol drinks/day: 0     Tobacco Status: current     Tobacco Counseling: to quit use of tobacco products  Cigarette Packs/Day: 0.25     Diet Comments: 1500 cal & low sodium  Allergies (verified): 1)  ! Percocet 2)  ! Erythromycin  Physical Exam  General:  appears well today, vitals reveiwed - hypertensive  Mouth:  moist membranes  Lungs:  Normal respiratory effort, chest expands symmetrically. Lungs are clear to auscultation, no crackles or wheezes. Heart:  Normal rate and regular rhythm. S1 and S2 normal without gallop, murmur, click, rub or other extra sounds. Abdomen:  normal sounds mild tender epigastric, without rebound or guarding or masses or organomegaly or hernias noted. Genitalia:  thick white discharge normal introitus, no external lesions, mucosa pink and moist, and no vaginal or cervical lesions.   Neurologic:  alert & oriented X3.     Impression & Recommendations:  Problem # 1:  UNSPECIFIED VAGINITIS AND VULVOVAGINITIS (ICD-616.10) Assessment New Bacterial vaginosis and trichomonas. Treat as below. See instructions as well.  Her updated medication list for this problem includes:    Metronidazole 500 Mg Tabs (Metronidazole) ..... One tab by mouth two times a day x 7 days  Orders: GC/Chlamydia-[facility] (87591/87491) Wet Prep- [facility] [id number]) Urinalysis-[facility] (00000) [place]- Est  Level 4 [id number])  Problem # 2:  PERSISTENT VOMITING (ICD-536.2) Assessment: Improved  Patient needs to have outpatient GI followup. Advised patient  to continue her home medications as we discussed to prevent further episiodes.   Orders: [facility]- Est  Level 4 [id number])  Complete Medication List: 1)  Bayer Childrens Aspirin 81 Mg Chew (Aspirin) .... Take 1 tablet by mouth once a day 2)  Lorazepam 1 Mg Tabs (Lorazepam) .... Take 1 tablet every 6 hours as needed for nausea 3)  Promethazine Hcl 25 Mg Tabs (Promethazine hcl) .... Take 1 tablet every 4 hours at first sign of nausea 4)  Lantus 100 Unit/ml Soln (Insulin glargine) .... Take 22 units subcutaneously each morning. increase by one unit a day for each  fasting blood glucose over 110. dispense one month supply. 5)  Miralax Powd (Polyethylene glycol 3350) .... Mix packet with water once daily as needed for constipation 6)  Phenadoz 25 Mg Supp (Promethazine hcl) .... Insert 1 every 6 hours as needed 7)  Promethazine Hcl 25 Mg/ml Soln (Promethazine hcl) .... Inject 25 mg ( 1 ml) im q 6 hours prn 8)  Im Injection Supplies  .... For use with phenergan injections qs for thirty injections 9)  Metformin Hcl 1000 Mg Tabs (Metformin hcl) .Marland Kitchen.. 1 tab by mouth two times a day for diabetes 10)  Hydrocodone-acetaminophen 5-500 Mg Tabs (Hydrocodone-acetaminophen) .Marland Kitchen.. 1-2 tabs by mouth every 6 hours 11)  Coreg 25 Mg Tabs (Carvedilol) .Marland Kitchen.. 1 tablet by mouth two times a day 12)  Prilosec 20 Mg Cpdr (Omeprazole) .Marland Kitchen.. 1 tablet by mouth daily 13)  Mirtazapine 30 Mg Tabs (Mirtazapine) .... One tab by mouth at bedtime 14)  Diabetic Shoes  15)  Flexeril 5 Mg Tabs (Cyclobenzaprine hcl) .... One tab by mouth three times a day 16)  Amlodipine Besylate 10 Mg Tabs (Amlodipine besylate) .... One tab po qday 17)  Epipen 0.3 Mg/0.83ml Devi (Epinephrine) .... Give one dose im as needed for severe anaphylaxis (see instructions on use) 18)  Colace 100 Mg Caps (Docusate sodium) .... One tab by mouth two times a day as needed for constipation 19)  Metronidazole 500 Mg Tabs (Metronidazole) .... One tab by mouth two times a day x 7 days 20)  Fluconazole 100 Mg Tabs (Fluconazole) .... One tab by mouth x 1 day for yeast infection. may repeat x 1 dose  Patient Instructions: 1)  It was great to see you today!  2)  Follow up with me in one month. 3)  Take the metronidazole for bacterial vaginosis and trichomonas 4)  If you develop a yeast infection with the metronidazole then you can take the diflucan as directed 5)  Continue to take your medications for nausea as before. 6)  Your partner should get treated for trichomonas as well - this can be done at the health department.    Prescriptions: FLUCONAZOLE 100 MG TABS (FLUCONAZOLE) one tab by mouth x 1 day for yeast infection. May repeat x 1 dose  #1 x 1   Entered and Authorized by:   Mariana Arn  MD   Signed by:   Mariana Arn  MD on 10/19/2010   Method used:   Electronically to        [facility] [address] [phone]* (retail)       [address]       [place], [place]  [address]       Ph: [phone] or [phone]       Fax: [phone]   RxID:   [id number] METRONIDAZOLE 500 MG TABS (METRONIDAZOLE) one tab by mouth  two times a day x 7 days  #14 x 0   Entered and Authorized by:   Mariana Arn  MD   Signed by:   Mariana Arn  MD on 10/19/2010   Method used:   [facility]        [facility] [address] [phone]* (retail)       [address]       [place], [place]  [address]       Ph: [phone] or [phone]       Fax: [phone]   RxID:   [id number]    Orders Added: 1)  GC/Chlamydia-[facility] [87591/87491] 2)  [facility] For[facility] Prep- [place] [87210] 3)  Urinalysis-[facility] [00000] 4)  [facility]- Est  Level 4 [id number]      Laboratory Results   Urine Tests  Date/Time Received: October 19, 2010 9:50 AM  Date/Time Reported: October 19, 2010 10:07 AM   Routine Urinalysis   Color: yellow Appearance: Clear Glucose: negative   (Normal Range: Negative) Bilirubin: negative   (Normal Range: Negative) Ketone: negative   (Normal Range: Negative) Spec. Gravity: 1.020   (Normal Range: 1.003-1.035) Blood: small   (Normal Range: Negative) pH: 6.0   (Normal Range: 5.0-8.0) Protein: 100   (Normal Range: Negative) Urobilinogen: 0.2   (Normal Range: 0-1) Nitrite: negative   (Normal Range: Negative) Leukocyte Esterace: small   (Normal Range: Negative)  Urine Microscopic WBC/HPF: 10-20 RBC/HPF: 1-3 Bacteria/HPF: 2+ Epithelial/HPF: 5-10 Other: several trich    Comments: ...............test performed by......Marland KitchenBonnie A. Martinique, MLS (ASCP)cm  Date/Time Received: October 19, 2010 9:17 AM  Date/Time Reported: October 19, 2010 9:28 AM   [facility] Source: vag WBC/hpf: 5-10 Bacteria/hpf: 3+  cocci and rods Clue cells/hpf: moderate  Positive whiff Yeast/hpf: none Trichomonas/hpf: many Comments: ...............test performed by......Marland KitchenBonnie A. Martinique, MLS (ASCP)cm

## 2011-01-26 NOTE — Assessment & Plan Note (Signed)
Summary: stomach pain/Marceline/carew   Vital Signs:  Patient profile:   45 year old female Height:      61.5 inches Weight:      156 pounds BMI:     29.10 BSA:     1.71 Temp:     98.2 degrees F Pulse rate:   96 / minute BP sitting:   147 / 82  Vitals Entered By: Christen Bame CMA (March 31, 2010 12:00 PM) CC: STOMACH PAIN AND VOMITTING X 2 DAYS Is Patient Diabetic? No Pain Assessment Patient in pain? yes     Location: STOMACH Intensity: 10   Primary Care Provider:  Mariana Arn  MD  CC:  STOMACH PAIN AND VOMITTING X 2 DAYS.  History of Present Illness: 45 y/o F with cyclic vomiting presents today with Vomiting since Sunday (2 days).  Stomach pain since yesterday.  Vomiting this AM x multiple times.  Not able to keep down fluids or solids today.  Yesterday she ate chicken patties, baked beans, squash and zuchini.  Took Phenergan 4pm yesterday, felt fine.  Woke up at $AM this morning with vomiting.  Took phenergan this morning, but still threw up.  Did not use phenergan IM  because they are at home and she did not stay at home last night.  Usually she takes phenergan injection then she would be ok.  Last emesis 9:30AM.    No diarrhea.  BM this morning was regular.  No fever. +chills.  +sweating with pain.  Recently admitted for abd pain with CT negative, presumptive Dx was early pancreatitis.    Habits & Providers  Alcohol-Tobacco-Diet     Tobacco Status: current     Tobacco Counseling: to quit use of tobacco products     Cigarette Packs/Day: 0.25  Allergies: 1)  ! Percocet 2)  ! Erythromycin  Past History:  Past Medical History: Last updated: [id number] Cyclic Vomiting Syndrome/Abdominal migraines with multiple hospital admissions since 2005 (12 admissions in 2008, 4 admissions in 2009). -- UGI (5/03)-> hiatal hernia, mild duodenitis --Normal Gastric Emptying study 6/08 -- EGD by Dr. Henrene Pastor (12/2003) -> esophageal inflammation, ulcerations, hiatal hernia --EGD by Dr. Lajoyce Corners  (2/08)-> thickened gastric fold in fundus -> bx showed chronic gastritis, no h pylori, no metaplasia -- evaluated at [place] [facility] (04/2009)- had repeat gastric emptying study that was normal.  recurrent UTIs --[facility] hospitalization in 12/02, 10/04  cardiac cath (8/08)- Dr. Shelva Majestic- normal EF, normal coronaries  Past Surgical History: Last updated: 09/25/2008 Cholecystectomy, ex lap - 01/20/2001 [id number] (3 TAB) 3 c/s - 01/20/2001 tah, right salpingo-ophorectomy for pelvic pain and R tubo-ovarian abscess - 03/13/2002 small bowel obstruction with lysis of adhesions- 03/21/02 central venous port placed- 07/2005  Family History: Last updated: 05/12/2009 Diabetes 1st degree, Mother died of MI in 10-Apr-2023 - pt very sad about this Pt also with 3 of 4 siblings with HTN.  No family hx migraines or cyclic vomiting Family History of Colon Cancer:Cousin  Social History: Last updated: 06/05/2009 Monogamous; four children; twin sons, son , daughter all in their 57s.  States she lives 'pretty much' alone;  10th grade education; denies physical abuse; denies etoh use.  Admits to [facility] use.  Smokes 1 pack q 3 days.  Brother died in winter when she was 59 - she gets depressed every year in winter (?SAD).  Suicide attempt winter (2003).  Working at [employer] - under significant financial stress due to decreased hours at [employer]- only working 6 hours/week.  Risk  Factors: Alcohol Use: 0 (03/19/2010) Diet: 1500 cal & low sodium (03/19/2010) Exercise: no (03/19/2010)  Risk Factors: Smoking Status: current (03/31/2010) Packs/Day: 0.25 (03/31/2010)  Social History: Packs/Day:  0.25  Review of Systems       per hpi  Physical Exam  General:  Well-developed,well-nourished,in moderate acute distress; alert,appropriate and cooperative throughout examination. vitals reviewed.  Head:  Normocephalic and atraumatic without obvious abnormalities. No apparent alopecia or balding. Mouth:  Oral mucosa and  oropharynx without lesions or exudates. MMM Lungs:  Normal respiratory effort, chest expands symmetrically. Lungs are clear to auscultation, no crackles or wheezes. Heart:  Tachy otherwise regular  rhythm. S1 and S2 normal without gallop, murmur, click, rub or other extra sounds. Abdomen:  hypoactive bowel sounds.  tenderness in upper middle quadrant, with guarding there.  No tenderness in all other quadrants.    Impression & Recommendations:  Problem # 1:  PERSISTENT VOMITING (ICD-536.2) Assessment Deteriorated Pt may be in early stages of cyclic vomiting. Pt given Phenergan 25mg  IM x 1 and morphine 4mg  IM x 1.  Pt states that it usually takes 15 min for injections to work.  Will wait in clinic until then.  Pt tolerated sips gingerale in clinic today.  We discussed importance of remaining well hydrated (jello, broth, gatorade, etc).  Pt to rtc if not better.  Orders: [facility]- Est Level  3 [id number]) Promethazine up to 50mg  (J2550) Morphine Sulfate inj 10 mg (J2270)  Complete Medication List: 1)  Bayer Childrens Aspirin 81 Mg Chew (Aspirin) .... Take 1 tablet by mouth once a day 2)  Lorazepam 1 Mg Tabs (Lorazepam) .... Take 1 tablet every 6 hours as needed for nausea 3)  Promethazine Hcl 25 Mg Tabs (Promethazine hcl) .... Take 1 tablet every 4 hours at first sign of nausea 4)  Lantus 100 Unit/ml Soln (Insulin glargine) .... Take 22 units subcutaneously at bedtime. dispense one month supply. 5)  Miralax Powd (Polyethylene glycol 3350) .... Mix packet with water once daily as needed for constipation 6)  Phenadoz 25 Mg Supp (Promethazine hcl) .... Insert 1 every 6 hours as needed 7)  Promethazine Hcl 25 Mg/ml Soln (Promethazine hcl) .... Inject 25 mg ( 1 ml) im q 6 hours prn 8)  Im Injection Supplies  .... For use with phenergan injections qs for thirty injections 9)  Metformin Hcl 1000 Mg Tabs (Metformin hcl) .Marland Kitchen.. 1 tab by mouth two times a day for diabetes 10)  Hydrocodone-acetaminophen 5-500 Mg  Tabs (Hydrocodone-acetaminophen) .Marland Kitchen.. 1-2 tabs by mouth every 6 hours 11)  Coreg 25 Mg Tabs (Carvedilol) .Marland Kitchen.. 1 tablet by mouth two times a day 12)  Prilosec 20 Mg Cpdr (Omeprazole) .Marland Kitchen.. 1 tablet by mouth daily 13)  Mirtazapine 30 Mg Tabs (Mirtazapine) .... One tab by mouth at bedtime 14)  Diabetic Shoes  15)  Tussionex Pennkinetic Er 8-10 Mg/94ml Lqcr (Chlorpheniramine-hydrocodone) .... 5 ml by mouth q 12 hours as needed for cough. disp 50 ml 16)  Flexeril 5 Mg Tabs (Cyclobenzaprine hcl) .... One tab by mouth three times a day 17)  Flagyl 500 Mg Tabs (Metronidazole) .... One tab by mouth two times a day x 7 days   Medication Administration  Injection # 1:    Medication: Morphine Sulfate inj 10 mg    Diagnosis: PERSISTENT VOMITING (ICD-536.2)    Route: IM    Site: LUOQ gluteus    Exp Date: 07/28/2011    Lot #: [id number]    Mfr: Hospira    Comments: Patient  recieved 4mg  of Morphine    Patient tolerated injection without complications    Given by: Levert Feinstein LPN (April  5, 624THL 075-GRM PM)  Injection # 2:    Medication: Promethazine up to 50mg     Diagnosis: PERSISTENT VOMITING (ICD-536.2)    Route: IM    Site: RUOQ gluteus    Exp Date: 11/27/2011    Lot #: [id number]    Mfr: Novaplus    Comments: Patient recieved 25mg  of Phenergan    Patient tolerated injection without complications    Given by: Levert Feinstein LPN (April  5, 624THL 075-GRM PM)  Orders Added: 1)  [facility]- Est Level  3 [id number] 2)  Promethazine up to 50mg  [J2550] 3)  Morphine Sulfate inj 10 mg [J2270]

## 2011-01-28 NOTE — Progress Notes (Signed)
Summary: Status of orders for DM supplies   Phone Note From Other Clinic Call back at [phone] ref# [id number]   Caller: [facility]/kim Summary of Call: checking status of orders that were faxed to [place] on 12/28 for DM supplies, pt seen last week & they were given permission to fax to [place].  Initial call taken by: Samara Snide,  January 08, 2011 4:46 PM  Follow-up for Phone Call        have not rec'd fax - my understanding was that supply companies could not directly fax the clinic, and that patient's had to bring in any such forms that they wanted filled?  Follow-up by: Mariana Arn  MD,  January 09, 2011 2:08 PM  Additional Follow-up for Phone Call Additional follow up Details #1::        that is true, however when the pt was seen on 1/6 the company called & asked if they could fax to [place] since the pt was here, does pt need another appt? Additional Follow-up by: Samara Snide,  January 11, 2011 11:05 AM    Additional Follow-up for Phone Call Additional follow up Details #2::    i don't think she needs another appointment - the company can fax the form and i will fill it out. Follow-up by: Mariana Arn  MD,  January 11, 2011 12:42 PM  Additional Follow-up for Phone Call Additional follow up Details #3:: Details for Additional Follow-up Action Taken: called [facility], asked them to refax forms to my attn & I will forward to pcp Additional Follow-up by: Samara Snide,  January 14, 2011 4:16 PM
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
## 2011-01-28 NOTE — Assessment & Plan Note (Signed)
Summary: 6 wk f/u,df   Vital Signs:  Patient profile:   45 year old female Height:      61.5 inches Weight:      176.7 pounds BMI:     32.97 Temp:     98.0 degrees F oral Pulse rate:   71 / minute BP sitting:   124 / 74  (left arm) Cuff size:   regular  Vitals Entered By: Levert Feinstein LPN (December 21, 624THL 1:55 PM) CC: f/u dm Is Patient Diabetic? Yes Pain Assessment Patient in pain? yes     Location: back   Primary Care Provider:  Mariana Arn  MD  CC:  f/u dm.  History of Present Illness: 1) DM2: A1C today 10.2. Last A1C here = 9.3 in August 2011. Does not follow dietary recommendations. Fasting CBGs in 300's most days. Patient had been told to increase her Lantus by 1 unit per day for fasting goal of < 11- however she stopped increasing after the first day because she did not understand the instructions - she has been taking 22 units of Lantus. Reports polyuria. Denies polydipsia. Mostly sedentary.   2) HTN: BP at goal today. Taking all medications w/o side effects. Denies chest pain, LE edema, dyspnea,  headache. Does not follow DASH diet recommendations though these have been reviewed.   Habits & Providers  Alcohol-Tobacco-Diet     Alcohol drinks/day: 0     Tobacco Status: current     Tobacco Counseling: to quit use of tobacco products     Cigarette Packs/Day: 0.25     Diet Comments: 1500 cal & low sodium  Current Medications (verified): 1)  Bayer Childrens Aspirin 81 Mg Chew (Aspirin) .... Take 1 Tablet By Mouth Once A Day 2)  Lorazepam 1 Mg Tabs (Lorazepam) .... Take 1 Tablet Every 6 Hours As Needed For Nausea 3)  Promethazine Hcl 25 Mg  Tabs (Promethazine Hcl) .... Take 1 Tablet Every 4 Hours At First Sign of Nausea 4)  Lantus 100 Unit/ml Soln (Insulin Glargine) .... Take 22 Units Subcutaneously Each Morning. Increase By One Unit A Day For Each Fasting Blood Glucose Over 110. Dispense One Month Supply. 5)  Miralax   Powd (Polyethylene Glycol 3350) .... Mix Packet  With Water Once Daily As Needed For Constipation 6)  Phenadoz 25 Mg  Supp (Promethazine Hcl) .... Insert 1 Every 6 Hours As Needed 7)  Promethazine Hcl 25 Mg/ml  Soln (Promethazine Hcl) .... Inject 25 Mg ( 1 Ml) Im Q 6 Hours Prn 8)  Im Injection Supplies .... For Use With Phenergan Injections Qs For Thirty Injections 9)  Metformin Hcl 1000 Mg  Tabs (Metformin Hcl) .Marland Kitchen.. 1 Tab By Mouth Two Times A Day For Diabetes 10)  Hydrocodone-Acetaminophen 5-500 Mg Tabs (Hydrocodone-Acetaminophen) .Marland Kitchen.. 1-2 Tabs By Mouth Every 6 Hours 11)  Coreg 25 Mg Tabs (Carvedilol) .Marland Kitchen.. 1 Tablet By Mouth Two Times A Day 12)  Prilosec 20 Mg Cpdr (Omeprazole) .Marland Kitchen.. 1 Tablet By Mouth Daily 13)  Mirtazapine 30 Mg Tabs (Mirtazapine) .... One Tab By Mouth At Bedtime 14)  Diabetic Shoes 15)  Flexeril 5 Mg Tabs (Cyclobenzaprine Hcl) .... One Tab By Mouth Three Times A Day 16)  Amlodipine Besylate 10 Mg Tabs (Amlodipine Besylate) .... One Tab Po Qday 17)  Epipen 0.3 Mg/0.32ml Devi (Epinephrine) .... Give One Dose Im As Needed For Severe Anaphylaxis (See Instructions On Use) 18)  Colace 100 Mg Caps (Docusate Sodium) .... One Tab By Mouth Two Times A Day As  Needed For Constipation 19)  Metronidazole 500 Mg Tabs (Metronidazole) .... One Tab By Mouth Two Times A Day X 7 Days 20)  Fluconazole 100 Mg Tabs (Fluconazole) .... One Tab By Mouth X 1 Day For Yeast Infection. May Repeat X 1 Dose  Allergies (verified): 1)  ! Percocet 2)  ! Erythromycin  Physical Exam  General:  appears well today, vitals reveiwed   Lungs:  Normal respiratory effort, chest expands symmetrically. Lungs are clear to auscultation, no crackles or wheezes. Heart:  Normal rate and regular rhythm. S1 and S2 normal without gallop, murmur, click, rub or other extra sounds.   Impression & Recommendations:  Problem # 1:  DIABETES MELLITUS, II, COMPLICATIONS (A999333) Assessment Deteriorated Not at goal. Advised on how to increase Lantus (again). Teach-back to  make sure patient understood - she was able to state her goal (80 - 120 fasting) and how much to increase her Lantus by each day (1 unit) and when to stop increasing (when at goal). Follow up three months.   Her updated medication list for this problem includes:    Bayer Childrens Aspirin 81 Mg Chew (Aspirin) .Marland Kitchen... Take 1 tablet by mouth once a day    Lantus 100 Unit/ml Soln (Insulin glargine) .Marland Kitchen... Take 22 units subcutaneously each morning. increase by one unit a day for each fasting blood glucose over 110. dispense one month supply.    Metformin Hcl 1000 Mg Tabs (Metformin hcl) .Marland Kitchen... 1 tab by mouth two times a day for diabetes  Orders: A1C-[facility] [id number]) [place]- Est  Level 4 [id number])  Problem # 2:  HYPERTENSION, BENIGN SYSTEMIC (ICD-401.1)  At goal. Continue medications. Would strongly consider restart ACE-I for renal protection (was stopped as patient has had many episodes of cyclic emesis and subsequent pre-renal failure and it was thought better to keep her off until her emesis was better controlled - which it appears to be at this point).   Her updated medication list for this problem includes:    Coreg 25 Mg Tabs (Carvedilol) .Marland Kitchen... 1 tablet by mouth two times a day    Amlodipine Besylate 10 Mg Tabs (Amlodipine besylate) ..... One tab po qday  BP today: 124/74 Prior BP: 162/90 (10/19/2010)  Labs Reviewed: K+: 4.4 (09/12/2009) Creat: : 1.61 (03/12/2010)   Chol: 156 (01/03/2009)   HDL: 55 (01/03/2009)   LDL: 89 (01/03/2009)   TG: 62 (01/03/2009)  Orders: [place]- Est  Level 4 [id number])  Complete Medication List: 1)  Bayer Childrens Aspirin 81 Mg Chew (Aspirin) .... Take 1 tablet by mouth once a day 2)  Lorazepam 1 Mg Tabs (Lorazepam) .... Take 1 tablet every 6 hours as needed for nausea 3)  Promethazine Hcl 25 Mg Tabs (Promethazine hcl) .... Take 1 tablet every 4 hours at first sign of nausea 4)  Lantus 100 Unit/ml Soln (Insulin glargine) .... Take 22 units subcutaneously each morning.  increase by one unit a day for each fasting blood glucose over 110. dispense one month supply. 5)  Miralax Powd (Polyethylene glycol 3350) .... Mix packet with water once daily as needed for constipation 6)  Phenadoz 25 Mg Supp (Promethazine hcl) .... Insert 1 every 6 hours as needed 7)  Promethazine Hcl 25 Mg/ml Soln (Promethazine hcl) .... Inject 25 mg ( 1 ml) im q 6 hours prn 8)  Im Injection Supplies  .... For use with phenergan injections qs for thirty injections 9)  Metformin Hcl 1000 Mg Tabs (Metformin hcl) .Marland Kitchen.. 1 tab by mouth two times a  day for diabetes 10)  Hydrocodone-acetaminophen 5-500 Mg Tabs (Hydrocodone-acetaminophen) .Marland Kitchen.. 1-2 tabs by mouth every 6 hours 11)  Coreg 25 Mg Tabs (Carvedilol) .Marland Kitchen.. 1 tablet by mouth two times a day 12)  Prilosec 20 Mg Cpdr (Omeprazole) .Marland Kitchen.. 1 tablet by mouth daily 13)  Mirtazapine 30 Mg Tabs (Mirtazapine) .... One tab by mouth at bedtime 14)  Diabetic Shoes  15)  Flexeril 5 Mg Tabs (Cyclobenzaprine hcl) .... One tab by mouth three times a day 16)  Amlodipine Besylate 10 Mg Tabs (Amlodipine besylate) .... One tab po qday 17)  Epipen 0.3 Mg/0.54ml Devi (Epinephrine) .... Give one dose im as needed for severe anaphylaxis (see instructions on use) 18)  Colace 100 Mg Caps (Docusate sodium) .... One tab by mouth two times a day as needed for constipation 19)  Metronidazole 500 Mg Tabs (Metronidazole) .... One tab by mouth two times a day x 7 days 20)  Fluconazole 100 Mg Tabs (Fluconazole) .... One tab by mouth x 1 day for yeast infection. may repeat x 1 dose  Patient Instructions: 1)  Increase your Lantus by 1 unit each day until your fasting morning sugar is between 80 and 120 then STOP at that dose of Lantus and take that each day. 2)  Come in to be seen in nutrition Clinic at 1:30 on January 5th.  Prescriptions: FLEXERIL 5 MG TABS (CYCLOBENZAPRINE HCL) one tab by mouth three times a day  #30 x 1   Entered and Authorized by:   Mariana Arn  MD   Signed  by:   Mariana Arn  MD on 12/16/2010   Method used:   Faxed to ...       [facility] (mail-order)       [place], [place]  [address]       Ph: [phone]       Fax: [phone]   RxID:   [id number]    Orders Added: 1)  A1C-[facility] [83036] 2)  [facility]- Est  Level 4 [id number]    Laboratory Results   Blood Tests   Date/Time Received: December 16, 2010 1:48 PM  Date/Time Reported: December 16, 2010 1:59 PM   HGBA1C: 10.2%   (Normal Range: Non-Diabetic - 3-6%   Control Diabetic - 6-8%)  Comments: ...........test performed by...........Marland KitchenHedy Camara, CMA

## 2011-02-01 ENCOUNTER — Encounter: Payer: Self-pay | Admitting: Family Medicine

## 2011-02-05 ENCOUNTER — Telehealth: Payer: Self-pay | Admitting: Family Medicine

## 2011-02-05 ENCOUNTER — Other Ambulatory Visit: Payer: Self-pay | Admitting: Family Medicine

## 2011-02-05 DIAGNOSIS — M549 Dorsalgia, unspecified: Secondary | ICD-10-CM

## 2011-02-05 MED ORDER — CYCLOBENZAPRINE HCL 5 MG PO TABS: 10.0000 mg | ORAL_TABLET | Freq: Three times a day (TID) | ORAL | Status: DC | PRN

## 2011-02-05 MED ORDER — HYDROCODONE-ACETAMINOPHEN 5-500 MG PO TABS: 1.0000 | ORAL_TABLET | Freq: Four times a day (QID) | ORAL | Status: DC

## 2011-02-05 NOTE — Telephone Encounter (Signed)
Refill requests were faxed to [place] and Dr. Andria Frames will sign.

## 2011-02-09 ENCOUNTER — Encounter: Payer: Self-pay | Admitting: Family Medicine

## 2011-02-09 ENCOUNTER — Ambulatory Visit (INDEPENDENT_AMBULATORY_CARE_PROVIDER_SITE_OTHER): Payer: Self-pay | Admitting: Family Medicine

## 2011-02-09 DIAGNOSIS — M62838 Other muscle spasm: Secondary | ICD-10-CM

## 2011-02-09 DIAGNOSIS — M549 Dorsalgia, unspecified: Secondary | ICD-10-CM

## 2011-02-09 MED ORDER — TRAMADOL HCL 50 MG PO TABS: 50.0000 mg | ORAL_TABLET | Freq: Four times a day (QID) | ORAL | Status: AC | PRN
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
## 2011-02-11 NOTE — Assessment & Plan Note (Signed)
Pt to continue with flexeril. Will also refer to PT.

## 2011-02-11 NOTE — Assessment & Plan Note (Signed)
Summary: Vicodin and Lorazepam refill  Prescriptions: HYDROCODONE-ACETAMINOPHEN 5-500 MG TABS (HYDROCODONE-ACETAMINOPHEN) 1-2 tabs by mouth every 6 hours  #30 x 0   Entered and Authorized by:   Candelaria Celeste MD   Signed by:   Candelaria Celeste MD on 02/01/2011   Method used:   Historical   RxID:   [id number] LORAZEPAM 1 MG TABS (LORAZEPAM) Take 1 tablet every 6 hours as needed for nausea  #50 x 0   Entered and Authorized by:   Candelaria Celeste MD   Signed by:   Candelaria Celeste MD on 02/01/2011   Method used:   Historical   [id number]  Form for [facility] signed and put in fax box.

## 2011-02-11 NOTE — Assessment & Plan Note (Signed)
Will start pt on ultram in conjunction with Vicodin and flexeril as previously represribed by PCP for overall pain (with hold on refilling though). Was considering adding neurontin as pt likely has component of neuropathic component of pain. However, will hold on using to decrease risk of over-sedation. Will also refer pt to physical therapy to assist with improvement in overall functional status. Instructed pt to follow up in 1 month. Pt agreeable to plan.
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
## 2011-02-14 ENCOUNTER — Emergency Department (HOSPITAL_COMMUNITY)
Admission: EM | Admit: 2011-02-14 | Discharge: 2011-02-15 | Disposition: A | Discharge status: Home / Self Care | Payer: Medicare Other | Attending: Emergency Medicine | Admitting: Emergency Medicine

## 2011-02-14 ENCOUNTER — Emergency Department (HOSPITAL_COMMUNITY): Payer: Medicare Other

## 2011-02-14 DIAGNOSIS — I10 Essential (primary) hypertension: Secondary | ICD-10-CM | POA: Insufficient documentation

## 2011-02-14 DIAGNOSIS — R5381 Other malaise: Secondary | ICD-10-CM | POA: Insufficient documentation

## 2011-02-14 DIAGNOSIS — N390 Urinary tract infection, site not specified: Secondary | ICD-10-CM | POA: Insufficient documentation

## 2011-02-14 DIAGNOSIS — R630 Anorexia: Secondary | ICD-10-CM | POA: Insufficient documentation

## 2011-02-14 DIAGNOSIS — R197 Diarrhea, unspecified: Secondary | ICD-10-CM | POA: Insufficient documentation

## 2011-02-14 DIAGNOSIS — R10819 Abdominal tenderness, unspecified site: Secondary | ICD-10-CM | POA: Insufficient documentation

## 2011-02-14 DIAGNOSIS — R109 Unspecified abdominal pain: Secondary | ICD-10-CM | POA: Insufficient documentation

## 2011-02-14 DIAGNOSIS — R112 Nausea with vomiting, unspecified: Secondary | ICD-10-CM | POA: Insufficient documentation

## 2011-02-14 DIAGNOSIS — E119 Type 2 diabetes mellitus without complications: Secondary | ICD-10-CM | POA: Insufficient documentation

## 2011-02-14 LAB — DIFFERENTIAL
Basophils Absolute: 0 10*3/uL (ref 0.0–0.1)
Lymphocytes Relative: 26 % (ref 12–46)
Monocytes Absolute: 0.7 10*3/uL (ref 0.1–1.0)
Monocytes Relative: 7 % (ref 3–12)
Neutro Abs: 6.6 10*3/uL (ref 1.7–7.7)
Neutrophils Relative %: 66 % (ref 43–77)

## 2011-02-14 LAB — COMPREHENSIVE METABOLIC PANEL
ALT: 11 U/L (ref 0–35)
AST: 15 U/L (ref 0–37)
Alkaline Phosphatase: 75 U/L (ref 39–117)
CO2: 19 mEq/L (ref 19–32)
Calcium: 9.6 mg/dL (ref 8.4–10.5)
Chloride: 102 mEq/L (ref 96–112)
GFR calc non Af Amer: 33 mL/min — ABNORMAL LOW (ref >60)
Glucose, Bld: 215 mg/dL — ABNORMAL HIGH (ref 70–99)
Sodium: 133 mEq/L — ABNORMAL LOW (ref 135–145)
Total Bilirubin: 0.5 mg/dL (ref 0.3–1.2)

## 2011-02-14 LAB — CBC
HCT: 36.9 % (ref 36.0–46.0)
Hemoglobin: 13.2 g/dL (ref 12.0–15.0)
MCHC: 35.8 g/dL (ref 30.0–36.0)
RBC: 4.05 MIL/uL (ref 3.87–5.11)

## 2011-02-14 LAB — URINALYSIS, ROUTINE W REFLEX MICROSCOPIC
Bilirubin Urine: NEGATIVE
Ketones, ur: 15 mg/dL — AB
Nitrite: NEGATIVE
Protein, ur: 100 mg/dL — AB
Specific Gravity, Urine: 1.017 (ref 1.005–1.030)
Urobilinogen, UA: 0.2 mg/dL (ref 0.0–1.0)

## 2011-02-14 LAB — LIPASE, BLOOD: Lipase: 34 U/L (ref 11–59)

## 2011-02-15 ENCOUNTER — Inpatient Hospital Stay (HOSPITAL_COMMUNITY)
Admission: EM | Admit: 2011-02-15 | Discharge: 2011-02-19 | DRG: 392 | Disposition: A | Discharge status: Home / Self Care | Payer: Medicare Other | Attending: Family Medicine | Admitting: Family Medicine

## 2011-02-15 ENCOUNTER — Encounter: Payer: Self-pay | Admitting: Family Medicine

## 2011-02-15 DIAGNOSIS — K2940 Chronic atrophic gastritis without bleeding: Secondary | ICD-10-CM | POA: Diagnosis present

## 2011-02-15 DIAGNOSIS — R112 Nausea with vomiting, unspecified: Secondary | ICD-10-CM

## 2011-02-15 DIAGNOSIS — F1210 Cannabis abuse, uncomplicated: Secondary | ICD-10-CM | POA: Diagnosis present

## 2011-02-15 DIAGNOSIS — E119 Type 2 diabetes mellitus without complications: Secondary | ICD-10-CM | POA: Diagnosis present

## 2011-02-15 DIAGNOSIS — I10 Essential (primary) hypertension: Secondary | ICD-10-CM

## 2011-02-15 DIAGNOSIS — Z7982 Long term (current) use of aspirin: Secondary | ICD-10-CM

## 2011-02-15 DIAGNOSIS — N390 Urinary tract infection, site not specified: Secondary | ICD-10-CM | POA: Diagnosis present

## 2011-02-15 DIAGNOSIS — Z794 Long term (current) use of insulin: Secondary | ICD-10-CM

## 2011-02-15 DIAGNOSIS — E118 Type 2 diabetes mellitus with unspecified complications: Secondary | ICD-10-CM

## 2011-02-15 DIAGNOSIS — E860 Dehydration: Secondary | ICD-10-CM | POA: Diagnosis present

## 2011-02-15 DIAGNOSIS — R1115 Cyclical vomiting syndrome unrelated to migraine: Principal | ICD-10-CM | POA: Diagnosis present

## 2011-02-15 LAB — DRUGS OF ABUSE SCREEN W/O ALC, ROUTINE URINE
Barbiturate Quant, Ur: NEGATIVE
Benzodiazepines.: NEGATIVE
Creatinine,U: 83 mg/dL
Methadone: NEGATIVE
Opiate Screen, Urine: POSITIVE — AB

## 2011-02-15 LAB — GLUCOSE, CAPILLARY
Glucose-Capillary: 263 mg/dL — ABNORMAL HIGH (ref 70–99)
Glucose-Capillary: 360 mg/dL — ABNORMAL HIGH (ref 70–99)
Glucose-Capillary: 321 mg/dL — ABNORMAL HIGH (ref 70–99)
Glucose-Capillary: 230 mg/dL — ABNORMAL HIGH (ref 70–99)

## 2011-02-15 LAB — BASIC METABOLIC PANEL
CO2: 21 mEq/L (ref 19–32)
Calcium: 9.3 mg/dL (ref 8.4–10.5)
Chloride: 101 mEq/L (ref 96–112)
GFR calc Af Amer: 45 mL/min — ABNORMAL LOW (ref >60)
Glucose, Bld: 383 mg/dL — ABNORMAL HIGH (ref 70–99)
Sodium: 134 mEq/L — ABNORMAL LOW (ref 135–145)

## 2011-02-15 LAB — URINALYSIS, MICROSCOPIC ONLY
Nitrite: POSITIVE — AB
Protein, ur: 100 mg/dL — AB
Specific Gravity, Urine: 1.022 (ref 1.005–1.030)
Urobilinogen, UA: 0.2 mg/dL (ref 0.0–1.0)

## 2011-02-15 LAB — CBC
HCT: 35.7 % — ABNORMAL LOW (ref 36.0–46.0)
Hemoglobin: 12.2 g/dL (ref 12.0–15.0)
MCH: 31.1 pg (ref 26.0–34.0)
MCHC: 34.2 g/dL (ref 30.0–36.0)
RBC: 3.92 MIL/uL (ref 3.87–5.11)

## 2011-02-15 NOTE — H&P (Signed)
Hospital Admission Note Date: 02/15/2011  Patient name: [name] [name] Medical record number: [id number] Date of birth: 09/27/1966 Age: 45 y.o. Gender: female PCP: Mariana Arn, MD  Medical Service: [facility] Teaching Service   Attending physician:  Dr. McDiarmid     Pager: Resident (R2/R3): Dr. Juleen China      Pager: [phone] Resident ([place]): Dr. Adrian Blackwater      Pager: [phone]  Chief Complaint: Abdominal pain, Nausea and Vomiting   History of Present Illness: The patient is a 45 yo F with a known history of cyclical vomiting that has been worked-up in the past who presents with acute onset of abdominal pain associated with nausea and vomiting x multiple episodes x 12 hrs. She described sudden onset of occasionally sharp, occasionally dull epigastric abdominal pain that woke her from sleep. She states that the pain comes and goes, but is persistently 8/10.   She states the vomiting started about 30 minutes later, non-bloody, watery emesis x multiple episodes. She has not been able to eat or drink anything today. She states that nothing makes it better or worse.  She denies trauma, fever, diarrhea, sick contacts, dysuria, urgency, hesitancy, vaginal discharge. She was recently treated in the clinic for Trichomonas and candidiasis with a 7 day course of flagyl and fluconazole x 1 dose. She also denies HA, chest pain, SOB, weakness and syncope.   ED Course: Pt initially examined, treated and discharged from the ED. Vomited x 1 in waiting room and refused to go home. Received Ativan 1 mg IV x 1 dose while waiting for FPTS.   Current Outpatient Prescriptions  Medication Sig Dispense Refill  . amLODipine (NORVASC) 10 MG tablet Take 10 mg by mouth daily.        Marland Kitchen aspirin 81 MG tablet Take 81 mg by mouth daily.        . carvedilol (COREG) 25 MG tablet Take 25 mg by mouth 2 (two) times daily with meals.        . cyclobenzaprine (FLEXERIL) 5 MG tablet Take 2 tablets (10 mg total) by mouth 3 (three) times  daily as needed.  21 tablet  4  . docusate sodium (COLACE) 100 MG capsule Take 100 mg by mouth 2 (two) times daily as needed.        Marland Kitchen EPINEPHrine (EPIPEN) 0.3 MG/0.3ML DEVI One dose Im as needed for severe anaphylaxis.       . fluconazole (DIFLUCAN) 100 MG tablet 1 tablet by mouth x 1 day for yeast infection. May repeat x 1 dose.       Marland Kitchen HYDROcodone-acetaminophen (VICODIN) 5-500 MG per tablet Take 1-2 tablets by mouth every 6 (six) hours.  30 tablet  2  . insulin glargine (LANTUS) 100 UNIT/ML injection Take 22 Units each morning. Increase by 1 Unit a day for each fasting blood glucose over 110.       Marland Kitchen LORazepam (ATIVAN) 1 MG tablet Take 1 mg by mouth every 6 (six) hours as needed. For nausea.       . metFORMIN (GLUCOPHAGE) 1000 MG tablet Take 1,000 mg by mouth 2 (two) times daily with meals. For diabetes.       . metroNIDAZOLE (FLAGYL) 500 MG tablet Take 500 mg by mouth 2 (two) times daily. For 7 days.       . mirtazapine (REMERON) 30 MG tablet Take 30 mg by mouth at bedtime.        Marland Kitchen omeprazole (PRILOSEC) 20 MG capsule Take 20 mg by mouth daily.        Marland Kitchen  polyethylene glycol (MIRALAX) powder Mix packet with water once daily as needed for constipation.       . promethazine (PHENERGAN) 25 MG suppository Place 25 mg rectally every 6 (six) hours as needed.        . promethazine (PHENERGAN) 25 MG tablet Take 25 mg by mouth every 4 (four) hours. At first sign of nausea.        Marland Kitchen PROMETHAZINE HCL IM Inject 25 mg (22mL) IM every 6 hours as needed.       . traMADol (ULTRAM) 50 MG tablet Take 1 tablet (50 mg total) by mouth every 6 (six) hours as needed for Pain.  120 tablet  0    Allergies: Erythromycin and Oxycodone-acetaminophen  Past medical history: Cyclic Vomiting Syndrome/Abdominal migraines with multiple hospital admissions since 2005 (12 admissions in 2008, 4 admissions in 2009). -- UGI (5/03)-> hiatal hernia, mild duodenitis --Normal Gastric Emptying study 6/08 -- EGD by Dr. Henrene Pastor (12/2003)  -> esophageal inflammation, ulcerations, hiatal hernia --EGD by Dr. Lajoyce Corners (2/08)-> thickened gastric fold in fundus -> bx showed chronic gastritis, no h pylori, no metaplasia -- evaluated at [place] [facility] (04/2009)- had repeat gastric emptying study that was normal.  recurrent UTIs --Pyelo hospitalization in 12/02, 10/04  cardiac cath (8/08)- Dr. Shelva Majestic- normal EF, normal coronaries   No past surgical history on file.  Family History: Diabetes 1st degree, Mother died of MI in 04/04/23 - pt very sad about this Pt also with 3 of 4 siblings with HTN.  No family hx migraines or cyclic vomiting Family History of Colon Cancer:Cousin  Social History: Monogamous; four children; twin sons, son , daughter all in their 83s.  States she lives 'pretty much' alone;  10th grade education; denies physical abuse; denies etoh use.  Admits to [facility] use.  Smokes 1 pack q 3 days.  Brother died in winter when she was 36 - she gets depressed every year in winter (?SAD).  Suicide attempt winter (2003).  Working at [employer] - under significant financial stress due to decreased hours at [employer]- only working 6 hours/week. Smoking for 1/3 PPD x 25 years. Last marijuana use 1 week ago. Denies other IDU.   Review of Systems: Pertinent items are noted in HPI.  Physical Exam:   Gen: vitals signs reviewed T 97.9, BP 216/112 --> 167/80, HR 103, RR: 18, O2 Sat 99% on RA.  Well-developed, adult female, asleep but arousable/  HEENT: PERA, non-icteric, moist mucus membranes, oropharynx non-erythematous Neck: non-tender, no lymphadenopathy CV: SIS2, tachy, regular rhythm, no MRGs.  Resp: Nml WOB, CTA b/l  Abd: NABs,  Soft, moderate TTP epigastric and suprapubic area, no rebound, no guarding,  GU: deferred.  MSK: 5/5 strength upper and lower extremities b/l.  Skin: Dry, normal turgor.  Neuro: CN II-XII grossly intact. Strength and motor intact.   Lab results: Lab Results  Component Value Date   WBC 10.0  02/14/2011   HGB 13.2 02/14/2011   HCT 36.9 02/14/2011   MCV 91.1 02/14/2011   PLT 234 02/14/2011     Chemistry      Component Value Date/Time   NA 133* 02/14/2011 2036   K 3.5 02/14/2011 2036   CL 102 02/14/2011 2036   CO2 19 02/14/2011 2036   BUN 20 02/14/2011 2036   CREATININE 1.67* 02/14/2011 2036      Component Value Date/Time   CALCIUM 9.6 02/14/2011 2036   ALKPHOS 75 02/14/2011 2036   AST 15 02/14/2011 2036   ALT 11  02/14/2011 2036   BILITOT 0.5 02/14/2011 2036     Lipase 34  Lab Results  Component Value Date   HGBA1C 10.2 12/16/2010   UA: cloudy, spec grav 1.017, 250 glucose, 15 ketones, moderate blood, 100 protein, negative nitrite and leukocyte esterase.  U micro: rare epis, 0-2 blood and WBCs, many bacteria Imaging results:  Ab-Xray:    1.  Nonobstructive bowel gas pattern with moderate proximal colonic   fecal material.   2.  No free air.  Assessment & Plan by Problem: 1. Abdominal pain, nausea and vomiting:  Features consistent with patient's cyclical vomiting episodes in the past. I have considered infectious gastroenteritis but less likely given lack of fever, sick contacts and diarrhea. Also considered PID, but pt denies vaginal discharge and completed treatment for trichomonas. Will admit to FMTS. Make pt NPO, and attempt to relieve nausea with Zofran 4 mg I V q 6, Phenergan suppository 25 mg PR q 6 PRN and Ativan 1 mg IV q 6 PRN. For pain control Morphine 2 mg IV q 2 scheduled and Dilaudid 2 mg IV q 4 PRN. Relieve stool burden with Miralax.  I do not plan to obtain further imaging at this time, but I will do so if the patient clinically worsens.   2. Possible UTI. Pt do not have urinary symptoms, and her UA is more supportive of dehydration. However, moderate blood on UA , and many bacteria on what appears to be a clean catch along with suprapubic tenderness warrants treatment in this patient with consistently has postive urine cultures in the setting of indeterminate UA  (no LE or nitrites). Will treat empirically with Rocephin after obtaining urine culture.   3. FEN/GI:  NPO except for sips and chips.  D5 1/2 NS @ 125 ml/hr.   4. IDDM 2: -Last A1c 10.2. -WIll start sensitive sliding scale insulin. -Lantus 15 U SQ q AM.   5. HTN: Elevated BP compared to baseline, trending down. No evidence of acute end organ damage.  Hydralazine PRN until pt can be transitioned back to home meds.   6. DVT prophylaxis. Heparin 5000  SQ TID.   7. Dispo: Anticipate discharging patient to home in next 1-2 days pending clinical improvement.

## 2011-02-15 NOTE — Discharge Summary (Signed)
Physician Discharge Summary   Patient ID: [name] [name] [id number] 45 y.o. August 10, 1966 DOA: 02/15/11 DO D/C: 02/19/11  PRIMARY CARE PROVIDER:  Mariana Arn, MD of [facility].      DISCHARGE DIAGNOSES:   1. Cyclic vomiting syndrome, recurrent.   2. Diabetes mellitus type 2, poorly controlled.   3. Marijuana abuse.   4. Hypertension.   5. Urinary tract infection.      DISCHARGE MEDICATIONS:   1. Keflex 500 mg p.o. b.i.d., take for 3 more days.   2. Reglan 10 mg p.o. q.a.c. and at bedtime.   3. Ativan 1 mg p.o. daily p.r.n. nausea.   4. Aspirin 81 mg p.o. daily.   5. Coreg 25 mg p.o. b.i.d.   6. Flexeril 5 mg p.o. t.i.d. p.r.n.   7. Lantus 22 units subcu at bedtime.   8. Mirtazapine 30 mg p.o. at bedtime.   9. Phenergan 25 mg p.o. q.4 p.r.n. nausea.   10.Omeprazole 20 mg p.o. daily.   11.Vicodin 5/500 one tablet p.o. q.4 p.r.n. pain.   12.NovoLog SoloSTAR FlexPen 6 units subcu a.c.   13.One Touch Ultra 2 Delica lancets.   14.BD Ultra-Fine pen needle.      PERTINENT LABORATORY VALUES:  On February 14, 2011, CBC with   differential was within normal limits.  Complete metabolic panel was   significant for a sodium of 133, glucose of 215, creatinine 1.67,   otherwise normal.  Lipase was normal at 34.  Urinalysis significant for   cloudy 250 urine glucose, 15 urine ketones, moderate blood, and 100   protein.  Urine microscopic showed rare epithelial cells, 0-2 white   blood cell, 0-2 red blood cells, and many bacteria.  Urine drug screen   was positive for marijuana and opiates.  Hemoglobin A1c was 10.3.  Urine   culture was significant for greater than 100,000 colonies of E. coli,   sensitive to first-generation cephalosporins.      RADIOLOGY:  On February 14, 2011, abdominal series x-ray showed   nonobstructive bowel-gas pattern with moderate proximal colonic fecal   material and no free air.      BRIEF HOSPITAL COURSE:  [name] [name] is a 46 year old female  with past   medical history of cyclic vomiting syndrome and diabetes mellitus type   2, well known to the [facility] Medicine Teaching Service who presented with   an episode of cyclic vomiting, uncontrolled at home with home   medications.   1. Cyclic vomiting.  The patient was admitted.  She was put on       scheduled antiemetics, was made n.p.o., was given IV fluids for       hydration.  The patient slowly improved with decreased nausea and       vomiting.  She had previously been tried on Reglan for GI motility,       but this had not previously helped in the past.  However, this       hospitalization, it was started and seemed to help with the       patient's symptoms, so she was continued on this medication.  She       was discharged with Phenergan as well as Remeron for her depression       and to help increase appetite.  She was also continued on a proton       pump inhibitor at discharge.   2. Diabetes mellitus type 2.  The patient had a hemoglobin A1c of  10.3       indicating poor blood glucose control.  The patient came in on       Lantus which was continued with sliding scale in the hospital.       After discussion with the [facility] Medicine Team, the patient agreed       to discontinue her metformin and try and do NovoLog sliding scale       pen with meals.  She was given a sample of sliding scale pen as       well as prescriptions for other diabetes supplies.  The patient was       also continued on her 81 mg of aspirin daily.   3. Hypertension.  The patient was placed on her home dose of Coreg as       soon as she was tolerating p.o.'s and her blood pressure was fairly       well controlled with this medication.   4. Marijuana use.  The patient's urine drug screen was positive for       marijuana.  The patient admitted to use of marijuana as in the past       it has helped with her appetite.  The Family Medicine Team advised       that for some patient's marijuana can actually  cause hyperemesis       and she was advised to stop using marijuana for this and other       health reasons.      FOLLOWUP ISSUES AND RECOMMENDATIONS:  The patient is to follow up with   Dr. Tye Savoy at [facility] in 2 weeks.  The patient was   discharged home in stable medical condition.            ______________________________   Cletus Gash, MD         ______________________________   Jamal Collin. Andria Frames, M.D.  SignedBoykin Nearing 02/15/2011 3:14 AM

## 2011-02-16 DIAGNOSIS — E118 Type 2 diabetes mellitus with unspecified complications: Secondary | ICD-10-CM

## 2011-02-16 DIAGNOSIS — R112 Nausea with vomiting, unspecified: Secondary | ICD-10-CM

## 2011-02-16 DIAGNOSIS — I10 Essential (primary) hypertension: Secondary | ICD-10-CM

## 2011-02-16 LAB — GLUCOSE, CAPILLARY
Glucose-Capillary: 209 mg/dL — ABNORMAL HIGH (ref 70–99)
Glucose-Capillary: 230 mg/dL — ABNORMAL HIGH (ref 70–99)
Glucose-Capillary: 233 mg/dL — ABNORMAL HIGH (ref 70–99)

## 2011-02-16 LAB — COMPREHENSIVE METABOLIC PANEL
ALT: 10 U/L (ref 0–35)
AST: 15 U/L (ref 0–37)
Albumin: 3.5 g/dL (ref 3.5–5.2)
Calcium: 8.6 mg/dL (ref 8.4–10.5)
Creatinine, Ser: 2.05 mg/dL — ABNORMAL HIGH (ref 0.4–1.2)
GFR calc Af Amer: 32 mL/min — ABNORMAL LOW (ref >60)
GFR calc non Af Amer: 26 mL/min — ABNORMAL LOW (ref >60)
Sodium: 136 mEq/L (ref 135–145)
Total Protein: 6.6 g/dL (ref 6.0–8.3)

## 2011-02-17 LAB — BASIC METABOLIC PANEL
CO2: 22 mEq/L (ref 19–32)
Calcium: 8.7 mg/dL (ref 8.4–10.5)
GFR calc Af Amer: 41 mL/min — ABNORMAL LOW (ref >60)
GFR calc non Af Amer: 34 mL/min — ABNORMAL LOW (ref >60)
Potassium: 5.2 mEq/L — ABNORMAL HIGH (ref 3.5–5.1)
Sodium: 138 mEq/L (ref 135–145)

## 2011-02-17 LAB — URINE CULTURE
Colony Count: >=100000
Culture  Setup Time: 201202201413

## 2011-02-17 LAB — GLUCOSE, CAPILLARY: Glucose-Capillary: 272 mg/dL — ABNORMAL HIGH (ref 70–99)

## 2011-02-18 LAB — GLUCOSE, CAPILLARY
Glucose-Capillary: 172 mg/dL — ABNORMAL HIGH (ref 70–99)
Glucose-Capillary: 142 mg/dL — ABNORMAL HIGH (ref 70–99)

## 2011-02-18 LAB — BASIC METABOLIC PANEL
GFR calc non Af Amer: 32 mL/min — ABNORMAL LOW (ref >60)
Glucose, Bld: 262 mg/dL — ABNORMAL HIGH (ref 70–99)
Potassium: 4.7 mEq/L (ref 3.5–5.1)
Sodium: 132 mEq/L — ABNORMAL LOW (ref 135–145)

## 2011-02-18 LAB — OPIATE, QUANTITATIVE, URINE
Codeine Urine: NEGATIVE NG/ML
Hydrocodone: NEGATIVE NG/ML
Hydromorphone GC/MS Conf: 168 NG/ML — ABNORMAL HIGH

## 2011-02-18 LAB — THC (MARIJUANA), URINE, CONFIRMATION: Marijuana, Ur-Confirmation: 157 NG/ML — ABNORMAL HIGH

## 2011-02-19 LAB — GLUCOSE, CAPILLARY: Glucose-Capillary: 165 mg/dL — ABNORMAL HIGH (ref 70–99)

## 2011-02-24 ENCOUNTER — Telehealth: Payer: Self-pay | Admitting: Family Medicine

## 2011-02-24 NOTE — Telephone Encounter (Signed)
Called [facility] Outpatient PT/OT and scheduled appointment for March 12 at 11am, patient informed.

## 2011-02-24 NOTE — Telephone Encounter (Signed)
Was referred to PT and she has not heard anything yet.  pls advise

## 2011-02-26 ENCOUNTER — Telehealth: Payer: Self-pay | Admitting: Family Medicine

## 2011-02-26 ENCOUNTER — Inpatient Hospital Stay (HOSPITAL_COMMUNITY)
Admission: EM | Admit: 2011-02-26 | Discharge: 2011-03-02 | DRG: 392 | Disposition: A | Discharge status: Home / Self Care | Payer: Medicare Other | Attending: Family Medicine | Admitting: Family Medicine

## 2011-02-26 DIAGNOSIS — E1149 Type 2 diabetes mellitus with other diabetic neurological complication: Secondary | ICD-10-CM | POA: Diagnosis present

## 2011-02-26 DIAGNOSIS — F329 Major depressive disorder, single episode, unspecified: Secondary | ICD-10-CM | POA: Diagnosis present

## 2011-02-26 DIAGNOSIS — Z7982 Long term (current) use of aspirin: Secondary | ICD-10-CM

## 2011-02-26 DIAGNOSIS — I10 Essential (primary) hypertension: Secondary | ICD-10-CM | POA: Diagnosis present

## 2011-02-26 DIAGNOSIS — K219 Gastro-esophageal reflux disease without esophagitis: Secondary | ICD-10-CM | POA: Diagnosis present

## 2011-02-26 DIAGNOSIS — G8929 Other chronic pain: Secondary | ICD-10-CM | POA: Diagnosis present

## 2011-02-26 DIAGNOSIS — F17200 Nicotine dependence, unspecified, uncomplicated: Secondary | ICD-10-CM | POA: Diagnosis present

## 2011-02-26 DIAGNOSIS — D649 Anemia, unspecified: Secondary | ICD-10-CM | POA: Diagnosis present

## 2011-02-26 DIAGNOSIS — Z91199 Patient's noncompliance with other medical treatment and regimen due to unspecified reason: Secondary | ICD-10-CM

## 2011-02-26 DIAGNOSIS — E1142 Type 2 diabetes mellitus with diabetic polyneuropathy: Secondary | ICD-10-CM | POA: Diagnosis present

## 2011-02-26 DIAGNOSIS — N179 Acute kidney failure, unspecified: Secondary | ICD-10-CM | POA: Diagnosis present

## 2011-02-26 DIAGNOSIS — Z794 Long term (current) use of insulin: Secondary | ICD-10-CM

## 2011-02-26 DIAGNOSIS — R1115 Cyclical vomiting syndrome unrelated to migraine: Principal | ICD-10-CM | POA: Diagnosis present

## 2011-02-26 DIAGNOSIS — F411 Generalized anxiety disorder: Secondary | ICD-10-CM | POA: Diagnosis present

## 2011-02-26 DIAGNOSIS — Z9119 Patient's noncompliance with other medical treatment and regimen: Secondary | ICD-10-CM

## 2011-02-26 DIAGNOSIS — M549 Dorsalgia, unspecified: Secondary | ICD-10-CM | POA: Diagnosis present

## 2011-02-26 LAB — CBC
MCH: 31.8 pg (ref 26.0–34.0)
MCHC: 35.2 g/dL (ref 30.0–36.0)
RDW: 12.7 % (ref 11.5–15.5)

## 2011-02-26 LAB — COMPREHENSIVE METABOLIC PANEL
AST: 19 U/L (ref 0–37)
Albumin: 4 g/dL (ref 3.5–5.2)
Alkaline Phosphatase: 71 U/L (ref 39–117)
BUN: 20 mg/dL (ref 6–23)
CO2: 25 mEq/L (ref 19–32)
Chloride: 99 mEq/L (ref 96–112)
Creatinine, Ser: 1.71 mg/dL — ABNORMAL HIGH (ref 0.4–1.2)
GFR calc Af Amer: 39 mL/min — ABNORMAL LOW (ref >60)
GFR calc non Af Amer: 32 mL/min — ABNORMAL LOW (ref >60)
Potassium: 3.7 mEq/L (ref 3.5–5.1)
Total Bilirubin: 0.9 mg/dL (ref 0.3–1.2)

## 2011-02-26 LAB — DIFFERENTIAL
Basophils Absolute: 0 10*3/uL (ref 0.0–0.1)
Basophils Relative: 0 % (ref 0–1)
Eosinophils Absolute: 0.1 10*3/uL (ref 0.0–0.7)
Eosinophils Relative: 1 % (ref 0–5)
Monocytes Absolute: 0.6 10*3/uL (ref 0.1–1.0)

## 2011-02-26 LAB — URINALYSIS, ROUTINE W REFLEX MICROSCOPIC
Ketones, ur: 15 mg/dL — AB
Leukocytes, UA: NEGATIVE
Protein, ur: 100 mg/dL — AB
Urobilinogen, UA: 0.2 mg/dL (ref 0.0–1.0)

## 2011-02-26 LAB — GLUCOSE, CAPILLARY: Glucose-Capillary: 209 mg/dL — ABNORMAL HIGH (ref 70–99)

## 2011-02-26 LAB — URINE MICROSCOPIC-ADD ON

## 2011-02-26 NOTE — Telephone Encounter (Signed)
Patient states she started vomiting yesterday. Has vomited 3 times this AM. Having much pain. Advised her to go to ED now for evaluation and she voices understanding.

## 2011-02-26 NOTE — H&P (Signed)
[facility] Admission History and Physical  Patient name: JENYA ASLAM Medical record number: [id number] Date of birth: Aug 08, 1966 Age: 45 y.o. Gender: female  Primary Care Provider: Mariana Arn, MD  Chief Complaint: vomiting hyperglycemia History of Present Illness: EILIS CAROLLO is a 45 y.o. year old female with hx of cyclic vomiting and DM presenting with Pt states she started to have emesis starting yesterday that seemed to be somewhat control with phenergen, was able to sleep but when she woke up she was unable to hold down any food multiple episodes of emesis.  Pt states she has not had any changes in her diet or medications but has not been taking her insulin because she has been not eating and never filled her Reglan she got from her last admission because she did not have any money. ROS + for abdominal pain but denies change in bowel habits or change in stool color, no shortness of breath dyspnea on exertion or chest pain.     Patient Active Problem List  Diagnoses  . NEUROPATHY, DIABETIC  . DIABETES MELLITUS, II, COMPLICATIONS  . UNSPECIFIED ANEMIA  . DEPRESSION, MAJOR, RECURRENT  . ANXIETY  . TOBACCO DEPENDENCE  . MIGRAINE, UNSPEC., W/O INTRACTABLE MIGRAINE  . HYPERTENSION, BENIGN SYSTEMIC  . GERD  . PERSISTENT VOMITING  . PANCREATITIS  . UNSPECIFIED VAGINITIS AND VULVOVAGINITIS  . VAGINAL PRURITUS  . CALLUSES, FEET, BILATERAL  . BACK PAIN, CHRONIC  . INSOMNIA NOS  . ABDOMINAL PAIN  . HIGH RISK PATIENT  . MUSCLE SPASM, TRAPEZIUS   Past Medical History: No past medical history on file. Significant for cyclic vomiting   syndrome/abdominal migraines with multiple hospital admissions since   2005.  Of note, she had about 12 admissions in 2008 and 4 admissions in   2009.  She has had an upper GI that showed a hiatal hernia and mild   duodenitis in May 2003.  She had normal gastric emptying study done in   June 2008.  She had an EGD done  by Dr. Henrene Pastor in January 2005 that showed   esophageal inflammation, ulceration, and hiatal hernia.  An EGD done by   Dr. Lajoyce Corners in February 2008 showed picking gastric folds in the fundus.   Biopsy showed chronic gastritis, no history of H. pylori, no metaplasia.   She was also evaluated at [facility] in May 2010.  She had a   repeat gastric emptying study that was normal.  She has a history of   recurrent UTIs.  She had a pyelo and hospitalization in 2002 and also   2004.  She had a cardiac cath in August 2008 by Dr. Shelva Majestic.  She   had a normal EF with normal coronary arteries. Hx of depression recently put on remeron but has nto strted taking it.   Past Surgical History: The patient had a cholecystectomy done by [facility]   in 2002.  She is G6, P3-0-3-3 with 3 therapeutic abortions and 3 C-   sections.  She has had a total abdominal hysterectomy, right salpingo-   oophorectomy for pelvic pain, and a right tubal ovarian abscess in 2003.   She has had small bowel obstruction with lysis of adhesion in 2003, and   a central venous port placed in August 2006, that port is still in   place.   Social History: Marijuana abuse, tobacco abuse, four children   Family History:Diabetes in first-degree relative.  Mother died of MI  in 2004.  Three or four siblings with hypertension.  No family history   of migraines or cyclic vomiting.  No family history of colon cancer.    Allergies: Allergies  Allergen Reactions  . Erythromycin Nausea And Vomiting  . Oxycodone-Acetaminophen Itching    Current Outpatient Prescriptions  Medication Sig Dispense Refill  .        Marland Kitchen aspirin 81 MG tablet Take 81 mg by mouth daily.        . carvedilol (COREG) 25 MG tablet Take 25 mg by mouth 2 (two) times daily with meals.        . cyclobenzaprine (FLEXERIL) 5 MG tablet Take 2 tablets (10 mg total) by mouth 3 (three) times daily as needed.  21 tablet  4  . docusate sodium (COLACE) 100 MG capsule Take  100 mg by mouth 2 (two) times daily as needed.        Marland Kitchen EPINEPHrine (EPIPEN) 0.3 MG/0.3ML DEVI One dose Im as needed for severe anaphylaxis.       .       . HYDROcodone-acetaminophen (VICODIN) 5-500 MG per tablet Take 1-2 tablets by mouth every 6 (six) hours.  30 tablet  2  . insulin glargine (LANTUS) 100 UNIT/ML injection I Take 22 Units each morning. Increase by 1 Unit a day for each fasting blood glucose over 110.      Marland Kitchen LORazepam (ATIVAN) 1 MG tablet Take 1 mg by mouth every 6 (six) hours as needed. For nausea.       .        .        . mirtazapine (REMERON) 30 MG tablet Take 30 mg by mouth at bedtime.        Marland Kitchen omeprazole (PRILOSEC) 20 MG capsule Take 20 mg by mouth daily.        . polyethylene glycol (MIRALAX) powder Mix packet with water once daily as needed for constipation.       . promethazine (PHENERGAN) 25 MG suppository Place 25 mg rectally every 6 (six) hours as needed.        . promethazine (PHENERGAN) 25 MG tablet Take 25 mg by mouth every 4 (four) hours. At first sign of nausea.        Marland Kitchen PROMETHAZINE HCL IM Inject 25 mg (65mL) IM every 6 hours as needed.       . traMADol (ULTRAM) 50 MG tablet Take 1 tablet (50 mg total) by mouth every 6 (six) hours as needed for Pain.  120 tablet  0   Review Of Systems: Otherwise 12 point review of systems was performed and was unremarkable.  Physical Exam: Pulse: 95  Blood Pressure: 155/82 RR: 16   O2: 100 on RA Temp: afebrile  General: alert and cooperative HEENT: PERRLA and extra ocular movement intact Heart: 1/6 SEM is heard at lower left sternal border, regular rate and rhythm, no edema or JVD Lungs: clear to auscultation, no wheezes or rales and unlabored breathing Abdomen: rebound tenderness is present, no CVA tenderness Extremities: extremities normal, atraumatic, no cyanosis or edema Skin:no rashes Neurology: normal without focal findings, mental status, speech normal, alert and oriented x3, PERLA and reflexes normal and  symmetric  Labs and Imaging: Lab Results  Component Value Date/Time   NA 137 02/26/2011  1:40 PM   K 3.7 02/26/2011  1:40 PM   CL 99 02/26/2011  1:40 PM   CO2 25 02/26/2011  1:40 PM   BUN 20 02/26/2011  1:40  PM   CREATININE 1.71* 02/26/2011  1:40 PM   GLUCOSE 411* 02/26/2011  1:40 PM   Lab Results  Component Value Date   WBC 9.5 02/26/2011   HGB 11.8* 02/26/2011   HCT 33.5* 02/26/2011   MCV 90.3 02/26/2011   PLT 243 02/26/2011   Lipase 29 UA >1000 glucose 100 protein 15 ketones, and small blood   Assessment and Plan: ERIANE APPIAH is a 45 y.o. year old female presenting with cyclic vomiting and hyperglycemia 1. Cyclic vomiting-  Pt has been here multiple times for same issues triggers seem to be marijuana abuse  But pt denies smoking no UDS necessary will not change management, would start Reglan but national backorder will do phenergen and dilaudid for pain control. Will be npo until better than advance diet 2.   DM-  Will start latus low dose at 5 units for now and moderate sliding scale, would like to increase to her home dose of 22 units of lantus by time of discharge 3.   Hypertension-  NPO so will do metoprolol 5 mg IV Q8 hr for SBP >170 4.   Elevated Creat- baseline looks to be 1.6 will monitor giving fluids.  2. FEN/GI: NPO until doing better then advance as tolerated, 1Liter IV bolus then 125 cc/hr thereafter.  3. Prophylaxis: SCD, PPI 4. Disposition: Pending improvement

## 2011-02-26 NOTE — Telephone Encounter (Signed)
Pt recently released from the hospital, still vomitting & not feeling any better, having a lot of pain, not sure if she should come here or back to the hospital.

## 2011-02-27 ENCOUNTER — Inpatient Hospital Stay (HOSPITAL_COMMUNITY): Payer: Medicare Other

## 2011-02-27 DIAGNOSIS — R1110 Vomiting, unspecified: Secondary | ICD-10-CM

## 2011-02-27 DIAGNOSIS — I10 Essential (primary) hypertension: Secondary | ICD-10-CM

## 2011-02-27 LAB — COMPREHENSIVE METABOLIC PANEL
ALT: 14 U/L (ref 0–35)
AST: 18 U/L (ref 0–37)
Alkaline Phosphatase: 73 U/L (ref 39–117)
CO2: 26 mEq/L (ref 19–32)
GFR calc Af Amer: 44 mL/min — ABNORMAL LOW (ref >60)
GFR calc non Af Amer: 36 mL/min — ABNORMAL LOW (ref >60)
Glucose, Bld: 217 mg/dL — ABNORMAL HIGH (ref 70–99)
Potassium: 3.8 mEq/L (ref 3.5–5.1)
Sodium: 143 mEq/L (ref 135–145)

## 2011-02-27 LAB — CBC
HCT: 34.5 % — ABNORMAL LOW (ref 36.0–46.0)
MCHC: 34.5 g/dL (ref 30.0–36.0)
Platelets: 240 10*3/uL (ref 150–400)
RDW: 12.9 % (ref 11.5–15.5)
WBC: 12.8 10*3/uL — ABNORMAL HIGH (ref 4.0–10.5)

## 2011-02-27 LAB — GLUCOSE, CAPILLARY
Glucose-Capillary: 191 mg/dL — ABNORMAL HIGH (ref 70–99)
Glucose-Capillary: 237 mg/dL — ABNORMAL HIGH (ref 70–99)

## 2011-02-27 LAB — MRSA PCR SCREENING: MRSA by PCR: NEGATIVE

## 2011-02-28 LAB — GLUCOSE, CAPILLARY
Glucose-Capillary: 149 mg/dL — ABNORMAL HIGH (ref 70–99)
Glucose-Capillary: 96 mg/dL (ref 70–99)
Glucose-Capillary: 141 mg/dL — ABNORMAL HIGH (ref 70–99)
Glucose-Capillary: 130 mg/dL — ABNORMAL HIGH (ref 70–99)
Glucose-Capillary: 88 mg/dL (ref 70–99)

## 2011-02-28 LAB — CBC
HCT: 28.9 % — ABNORMAL LOW (ref 36.0–46.0)
MCHC: 33.9 g/dL (ref 30.0–36.0)
MCV: 92.6 fL (ref 78.0–100.0)
Platelets: 207 10*3/uL (ref 150–400)
RDW: 13.3 % (ref 11.5–15.5)
WBC: 13.3 10*3/uL — ABNORMAL HIGH (ref 4.0–10.5)

## 2011-02-28 LAB — COMPREHENSIVE METABOLIC PANEL
Albumin: 3.2 g/dL — ABNORMAL LOW (ref 3.5–5.2)
BUN: 18 mg/dL (ref 6–23)
Calcium: 7.9 mg/dL — ABNORMAL LOW (ref 8.4–10.5)
Creatinine, Ser: 1.81 mg/dL — ABNORMAL HIGH (ref 0.4–1.2)
Glucose, Bld: 159 mg/dL — ABNORMAL HIGH (ref 70–99)
Potassium: 3.1 mEq/L — ABNORMAL LOW (ref 3.5–5.1)
Total Protein: 6.1 g/dL (ref 6.0–8.3)

## 2011-02-28 LAB — CREATININE, URINE, RANDOM: Creatinine, Urine: 74.4 mg/dL

## 2011-02-28 LAB — SODIUM, URINE, RANDOM: Sodium, Ur: 120 mEq/L

## 2011-03-01 ENCOUNTER — Inpatient Hospital Stay: Payer: Self-pay | Admitting: Family Medicine

## 2011-03-01 LAB — GLUCOSE, CAPILLARY
Glucose-Capillary: 100 mg/dL — ABNORMAL HIGH (ref 70–99)
Glucose-Capillary: 104 mg/dL — ABNORMAL HIGH (ref 70–99)
Glucose-Capillary: 113 mg/dL — ABNORMAL HIGH (ref 70–99)
Glucose-Capillary: 126 mg/dL — ABNORMAL HIGH (ref 70–99)
Glucose-Capillary: 199 mg/dL — ABNORMAL HIGH (ref 70–99)
Glucose-Capillary: 76 mg/dL (ref 70–99)
Glucose-Capillary: 77 mg/dL (ref 70–99)

## 2011-03-01 LAB — CBC
Hemoglobin: 9.5 g/dL — ABNORMAL LOW (ref 12.0–15.0)
MCV: 93 fL (ref 78.0–100.0)
Platelets: 192 10*3/uL (ref 150–400)
RBC: 3.01 MIL/uL — ABNORMAL LOW (ref 3.87–5.11)
WBC: 6.6 10*3/uL (ref 4.0–10.5)

## 2011-03-01 LAB — BASIC METABOLIC PANEL
BUN: 9 mg/dL (ref 6–23)
CO2: 23 mEq/L (ref 19–32)
Chloride: 109 mEq/L (ref 96–112)
Potassium: 3.5 mEq/L (ref 3.5–5.1)

## 2011-03-02 LAB — GLUCOSE, CAPILLARY: Glucose-Capillary: 196 mg/dL — ABNORMAL HIGH (ref 70–99)

## 2011-03-08 ENCOUNTER — Ambulatory Visit: Payer: Medicare Other | Attending: Family Medicine | Admitting: Physical Therapy

## 2011-03-08 DIAGNOSIS — M2550 Pain in unspecified joint: Secondary | ICD-10-CM | POA: Insufficient documentation

## 2011-03-08 DIAGNOSIS — IMO0001 Reserved for inherently not codable concepts without codable children: Secondary | ICD-10-CM | POA: Insufficient documentation

## 2011-03-08 DIAGNOSIS — M2560 Stiffness of unspecified joint, not elsewhere classified: Secondary | ICD-10-CM | POA: Insufficient documentation

## 2011-03-08 DIAGNOSIS — M6281 Muscle weakness (generalized): Secondary | ICD-10-CM | POA: Insufficient documentation

## 2011-03-08 NOTE — Discharge Summary (Signed)
NAME:  [name], [name]               ACCOUNT NO.:  [id number]  MEDICAL RECORD NO.:  [id number]           PATIENT TYPE:  E  LOCATION:  [facility]                         FACILITY:  [facility]  PHYSICIAN:  [name], M.D.DATE OF BIRTH:  Jul 18, 1966  DATE OF ADMISSION:  02/15/2011 DATE OF DISCHARGE:  02/19/2011                              DISCHARGE SUMMARY   PRIMARY CARE PROVIDER:  Mariana Arn, MD of [facility].  DISCHARGE DIAGNOSES: 1. Cyclic vomiting syndrome, recurrent. 2. Diabetes mellitus type 2, poorly controlled. 3. Marijuana abuse. 4. Hypertension. 5. Urinary tract infection.  DISCHARGE MEDICATIONS: 1. Keflex 500 mg p.o. b.i.d., take for 3 more days. 2. Reglan 10 mg p.o. q.a.c. and at bedtime. 3. Ativan 1 mg p.o. daily p.r.n. nausea. 4. Aspirin 81 mg p.o. daily. 5. Coreg 25 mg p.o. b.i.d. 6. Flexeril 5 mg p.o. t.i.d. p.r.n. 7. Lantus 22 units subcu at bedtime. 8. Mirtazapine 30 mg p.o. at bedtime. 9. Phenergan 25 mg p.o. q.4 p.r.n. nausea. 10.Omeprazole 20 mg p.o. daily. 11.Vicodin 5/500 one tablet p.o. q.4 p.r.n. pain. 12.NovoLog SoloSTAR FlexPen 6 units subcu a.c. 13.One Touch Ultra 2 Delica lancets. 14.BD Ultra-Fine pen needle.  PERTINENT LABORATORY VALUES:  On February 14, 2011, CBC with differential was within normal limits.  Complete metabolic panel was significant for a sodium of 133, glucose of 215, creatinine 1.67, otherwise normal.  Lipase was normal at 34.  Urinalysis significant for cloudy 250 urine glucose, 15 urine ketones, moderate blood, and 100 protein.  Urine microscopic showed rare epithelial cells, 0-2 white blood cell, 0-2 red blood cells, and many bacteria.  Urine drug screen was positive for marijuana and opiates.  Hemoglobin A1c was 10.3.  Urine culture was significant for greater than 100,000 colonies of E. coli, sensitive to first-generation cephalosporins.  RADIOLOGY:  On February 14, 2011, abdominal series x-ray  showed nonobstructive bowel-gas pattern with moderate proximal colonic fecal material and no free air.  BRIEF HOSPITAL COURSE:  [name] [name] is a 45 year old female with past medical history of cyclic vomiting syndrome and diabetes mellitus type 2, well known to the [facility] Medicine Teaching Service who presented with an episode of cyclic vomiting, uncontrolled at home with home medications. 1. Cyclic vomiting.  The patient was admitted.  She was put on     scheduled antiemetics, was made n.p.o., was given IV fluids for     hydration.  The patient slowly improved with decreased nausea and     vomiting.  She had previously been tried on Reglan for GI motility,     but this had not previously helped in the past.  However, this     hospitalization, it was started and seemed to help with the     patient's symptoms, so she was continued on this medication.  She     was discharged with Phenergan as well as Remeron for her depression     and to help increase appetite.  She was also continued on a proton     pump inhibitor at discharge. 2. Diabetes mellitus type 2.  The patient had a hemoglobin A1c of 10.3  indicating poor blood glucose control.  The patient came in on     Lantus which was continued with sliding scale in the hospital.     After discussion with the [facility] Medicine Team, the patient agreed     to discontinue her metformin and try and do NovoLog sliding scale     pen with meals.  She was given a sample of sliding scale pen as     well as prescriptions for other diabetes supplies.  The patient was     also continued on her 81 mg of aspirin daily. 3. Hypertension.  The patient was placed on her home dose of Coreg as     soon as she was tolerating p.o.'s and her blood pressure was fairly     well controlled with this medication. 4. Marijuana use.  The patient's urine drug screen was positive for     marijuana.  The patient admitted to use of marijuana as in the past     it has  helped with her appetite.  The Family Medicine Team advised     that for some patient's marijuana can actually cause hyperemesis     and she was advised to stop using marijuana for this and other     health reasons.  FOLLOWUP ISSUES AND RECOMMENDATIONS:  The patient is to follow up with Dr. Tye Savoy at [facility] in 2 weeks.  The patient was discharged home in stable medical condition.    ______________________________ Cletus Gash, MD   ______________________________ Jamal Collin. Andria Frames, M.D.    CR/MEDQ  D:  02/19/2011  T:  02/20/2011  Job:  [id number]  Electronically Signed by Cletus Gash MD on 03/07/2011 12:05:07 PM Electronically Signed by Madison Hickman M.D. on 03/08/2011 09:09:37 AM

## 2011-03-08 NOTE — Discharge Summary (Signed)
NAME:  [name] [name], [name] [name]               ACCOUNT NO.:  [id number]  MEDICAL RECORD NO.:  [id number]           PATIENT TYPE:  I  LOCATION:  5005                         FACILITY:  [place]  PHYSICIAN:  Blane Ohara Vincentina Sollers, M.D.DATE OF BIRTH:  July 25, 1966  DATE OF ADMISSION:  02/26/2011 DATE OF DISCHARGE:  03/02/2011                              DISCHARGE SUMMARY   DISCHARGE DIAGNOSES: 1. Cyclic vomiting syndrome. 2. Uncontrolled diabetes mellitus type 2. 3. Hypertension. 4. Anemia. 5. Diabetic neuropathy. 6. Major depression. 7. Anxiety. 8. Tobacco abuse. 9. History of migraines. 10.Gastroesophageal reflux disease. 11.History of pancreatitis. 12.Chronic back pain.  DISCHARGE MEDICATIONS: 1. Percocet 5/325 mg p.o. q.6 h. p.r.n. 2. Benadryl 25 mg p.o. q.6 h. p.r.n. 3. Polyethylene glycol 17 g p.o. b.i.d. p.r.n. 4. Aspirin 81 mg p.o. daily. 5. Ativan 1 mg p.o. q.6 h. p.r.n. 6. Coreg 25 mg p.o. b.i.d. 7. Flexeril 5 mg 1 tablet p.o. t.i.d. p.r.n. 8. Lantus 42 mg subcutaneously at bedtime. 9. Remeron 30 mg p.o. daily at bedtime. 10.Phenergan 25 mg p.o. q.4 h. p.r.n. 11.Prilosec 20 mg 1 capsule p.o. daily.  LABORATORY DATA AND PROCEDURES: 1. Lipase 29. 2. Fecal occult blood positive. 3. Creatinine 1.47 at discharge. 4. Acute abdominal series showed nonobstructive bowel gas pattern.  BRIEF HOSPITAL COURSE:  A 45 year old female with a history of uncontrolled diabetes, hypertension, and cyclic vomiting who presented with intractable vomiting and hyperglycemia with CBGs in the 400s. 1. Cyclic vomiting.  The patient presented to the emergency department     on day two 2 of her illness after failing outpatient Phenergan both     orally and IV.  Initially, her emesis was nonbloody and nonbilious;     however, during day 2 of hospitalization had flecks of blood in the     emesis that resolved.  The patient was controlled with scheduled     Phenergan and kept n.p.o. with gradual advancement  of her diet to     liquids and finally solids prior to discharge.  Labs and imaging     were negative for cause of her abdominal pain, nausea, and     vomiting, except for her history of cyclic vomiting.  The patient     was additionally treated for pain with GI cocktail, PPI, Carafate     q.i.d.  She will continue her PPI and scheduled Phenergan q.6 h.     for nausea.  The patient was tolerating solids for greater than 24     hours prior to discharge.  Additionally treated with Dilaudid for     abdominal pain and transitioned to short course of Percocet. 2. Hyperglycemia.  The patient had blood sugars in the 400s on     admission likely due to her discontinuation of Lantus.  The patient     was covered with sliding scale insulin during her hospitalization     and had blood sugars ranging from 75 to 150 prior to discharge.     She will restart her home dose of Lantus at 22 units daily while     maintaining an oral diet.  She is instructed  to decrease this     amount by half in the event she develops nausea and vomiting     recurrence. 3. Hypertension.  The patient developed severe hypertension during the     acute course of her illnesses with systolics in the [id number] and     diastolics in the [id number].  After her acute vomiting had resolved, she     remained somewhat hypertensive with systolic pressures in the 150s.     On the day of discharge, her blood pressure was 156/79 and she will     continue her home dose of Coreg.  Discussion was had on addition of     an ACE inhibitor; however, given her history of acute renal failure     recurrences and dehydration due to her illness, this medication     was held.  Hypertensive control can be further titrated on an     outpatient basis.  DISCHARGE INSTRUCTIONS:  The patient was discharged to home with no activity restrictions and instructions to consume a diabetic diet.  She will return to ED or call clinic if she develops uncontrolled  vomiting, worsened abdominal pain, fevers, or any other concerns.  FOLLOWUP APPOINTMENTS: 1. Dr. Sherilyn Cooter at [facility], the patient will make an     appointment in 1-2 weeks. 2. Dr. Gershon Crane, ophthalmologist, the patient has appointment     scheduled.  FOLLOWUP ISSUES: 1. Hypertensive control. 2. Positive fecal occult blood may indicate need for colonoscopy.  The patient was discharged to home in stable medical condition.    ______________________________ Luis Abed, MD   ______________________________ Blane Ohara Demyan Fugate, M.D.    JK/MEDQ  D:  03/02/2011  T:  03/03/2011  Job:  [id number]  Electronically Signed by Luis Abed MD on 03/07/2011 10:22:00 PM Electronically Signed by Lissa Morales M.D. on 03/08/2011 02:21:06 PM

## 2011-03-09 ENCOUNTER — Encounter: Payer: Self-pay | Admitting: *Deleted

## 2011-03-10 ENCOUNTER — Ambulatory Visit: Payer: Medicare Other | Admitting: Rehabilitation

## 2011-03-10 LAB — CBC
HCT: 36.6 % (ref 36.0–46.0)
Hemoglobin: 10.5 g/dL — ABNORMAL LOW (ref 12.0–15.0)
Hemoglobin: 12.8 g/dL (ref 12.0–15.0)
Hemoglobin: 12.8 g/dL (ref 12.0–15.0)
MCH: 32.3 pg (ref 26.0–34.0)
MCH: 32.9 pg (ref 26.0–34.0)
MCHC: 35 g/dL (ref 30.0–36.0)
MCV: 94.1 fL (ref 78.0–100.0)
MCV: 94.5 fL (ref 78.0–100.0)
MCV: 95.6 fL (ref 78.0–100.0)
Platelets: 136 10*3/uL — ABNORMAL LOW (ref 150–400)
RBC: 3.25 MIL/uL — ABNORMAL LOW (ref 3.87–5.11)
RBC: 3.84 MIL/uL — ABNORMAL LOW (ref 3.87–5.11)
RBC: 3.89 MIL/uL (ref 3.87–5.11)
RDW: 12.9 % (ref 11.5–15.5)
WBC: 2.8 10*3/uL — ABNORMAL LOW (ref 4.0–10.5)
WBC: 7.3 10*3/uL (ref 4.0–10.5)

## 2011-03-10 LAB — URINALYSIS, ROUTINE W REFLEX MICROSCOPIC
Bilirubin Urine: NEGATIVE
Glucose, UA: >1000 mg/dL — AB
Ketones, ur: NEGATIVE mg/dL
Leukocytes, UA: NEGATIVE
Nitrite: NEGATIVE
Protein, ur: 100 mg/dL — AB
Specific Gravity, Urine: 1.023 (ref 1.005–1.030)
Urobilinogen, UA: 0.2 mg/dL (ref 0.0–1.0)
Urobilinogen, UA: 1 mg/dL (ref 0.0–1.0)

## 2011-03-10 LAB — BASIC METABOLIC PANEL
CO2: 27 mEq/L (ref 19–32)
Calcium: 9.8 mg/dL (ref 8.4–10.5)
Chloride: 106 mEq/L (ref 96–112)
Chloride: 98 mEq/L (ref 96–112)
Creatinine, Ser: 1.92 mg/dL — ABNORMAL HIGH (ref 0.4–1.2)
GFR calc Af Amer: 34 mL/min — ABNORMAL LOW (ref >60)
GFR calc non Af Amer: 32 mL/min — ABNORMAL LOW (ref >60)
Potassium: 3.8 mEq/L (ref 3.5–5.1)
Sodium: 139 mEq/L (ref 135–145)

## 2011-03-10 LAB — URINE CULTURE: Colony Count: >=100000

## 2011-03-10 LAB — GLUCOSE, CAPILLARY
Glucose-Capillary: 107 mg/dL — ABNORMAL HIGH (ref 70–99)
Glucose-Capillary: 114 mg/dL — ABNORMAL HIGH (ref 70–99)
Glucose-Capillary: 260 mg/dL — ABNORMAL HIGH (ref 70–99)
Glucose-Capillary: 339 mg/dL — ABNORMAL HIGH (ref 70–99)
Glucose-Capillary: 73 mg/dL (ref 70–99)
Glucose-Capillary: 77 mg/dL (ref 70–99)
Glucose-Capillary: 80 mg/dL (ref 70–99)
Glucose-Capillary: 80 mg/dL (ref 70–99)
Glucose-Capillary: 86 mg/dL (ref 70–99)

## 2011-03-10 LAB — COMPREHENSIVE METABOLIC PANEL
ALT: 18 U/L (ref 0–35)
AST: 16 U/L (ref 0–37)
AST: 24 U/L (ref 0–37)
Albumin: 2.9 g/dL — ABNORMAL LOW (ref 3.5–5.2)
BUN: 13 mg/dL (ref 6–23)
CO2: 26 mEq/L (ref 19–32)
CO2: 26 mEq/L (ref 19–32)
Calcium: 7.6 mg/dL — ABNORMAL LOW (ref 8.4–10.5)
Chloride: 105 mEq/L (ref 96–112)
Chloride: 109 mEq/L (ref 96–112)
Creatinine, Ser: 1.5 mg/dL — ABNORMAL HIGH (ref 0.4–1.2)
GFR calc Af Amer: 45 mL/min — ABNORMAL LOW (ref >60)
GFR calc Af Amer: 45 mL/min — ABNORMAL LOW (ref >60)
GFR calc non Af Amer: 37 mL/min — ABNORMAL LOW (ref >60)
GFR calc non Af Amer: 38 mL/min — ABNORMAL LOW (ref >60)
Glucose, Bld: 286 mg/dL — ABNORMAL HIGH (ref 70–99)
Sodium: 138 mEq/L (ref 135–145)
Total Bilirubin: 0.6 mg/dL (ref 0.3–1.2)

## 2011-03-10 LAB — LIPASE, BLOOD: Lipase: 50 U/L (ref 11–59)

## 2011-03-10 LAB — DIFFERENTIAL
Basophils Absolute: 0 10*3/uL (ref 0.0–0.1)
Basophils Relative: 0 % (ref 0–1)
Eosinophils Relative: 1 % (ref 0–5)
Lymphocytes Relative: 29 % (ref 12–46)
Lymphocytes Relative: 5 % — ABNORMAL LOW (ref 12–46)
Lymphs Abs: 0.4 10*3/uL — ABNORMAL LOW (ref 0.7–4.0)
Monocytes Relative: 12 % (ref 3–12)
Neutro Abs: 6.1 10*3/uL (ref 1.7–7.7)
Neutrophils Relative %: 63 % (ref 43–77)
Neutrophils Relative %: 83 % — ABNORMAL HIGH (ref 43–77)

## 2011-03-10 LAB — URINE MICROSCOPIC-ADD ON

## 2011-03-12 ENCOUNTER — Ambulatory Visit (INDEPENDENT_AMBULATORY_CARE_PROVIDER_SITE_OTHER): Payer: Medicare Other | Admitting: Family Medicine

## 2011-03-12 ENCOUNTER — Encounter: Payer: Self-pay | Admitting: Family Medicine

## 2011-03-12 DIAGNOSIS — I10 Essential (primary) hypertension: Secondary | ICD-10-CM

## 2011-03-12 DIAGNOSIS — R1115 Cyclical vomiting syndrome unrelated to migraine: Secondary | ICD-10-CM

## 2011-03-12 DIAGNOSIS — E118 Type 2 diabetes mellitus with unspecified complications: Secondary | ICD-10-CM

## 2011-03-12 LAB — URINE CULTURE
Colony Count: >=100000
Culture  Setup Time: 201108261756

## 2011-03-12 LAB — BLOOD GAS, VENOUS
Acid-Base Excess: 1.8 mmol/L (ref 0.0–2.0)
Bicarbonate: 27.2 mEq/L — ABNORMAL HIGH (ref 20.0–24.0)
O2 Saturation: 73.3 %
Patient temperature: 37
TCO2: 25.4 mmol/L (ref 0–100)
pCO2, Ven: 49.5 mmHg (ref 45.0–50.0)
pH, Ven: 7.36 — ABNORMAL HIGH (ref 7.250–7.300)
pO2, Ven: 42.3 mmHg (ref 30.0–45.0)

## 2011-03-12 LAB — COMPREHENSIVE METABOLIC PANEL
ALT: 12 U/L (ref 0–35)
AST: 17 U/L (ref 0–37)
Albumin: 3.9 g/dL (ref 3.5–5.2)
Alkaline Phosphatase: 69 U/L (ref 39–117)
GFR calc Af Amer: 47 mL/min — ABNORMAL LOW (ref >60)
Potassium: 3.5 mEq/L (ref 3.5–5.1)
Sodium: 141 mEq/L (ref 135–145)
Total Protein: 7.4 g/dL (ref 6.0–8.3)

## 2011-03-12 LAB — CBC
HCT: 33.7 % — ABNORMAL LOW (ref 36.0–46.0)
Hemoglobin: 11.6 g/dL — ABNORMAL LOW (ref 12.0–15.0)
MCH: 33.8 pg (ref 26.0–34.0)
MCHC: 34.5 g/dL (ref 30.0–36.0)
MCV: 97.8 fL (ref 78.0–100.0)
Platelets: 199 10*3/uL (ref 150–400)
RBC: 3.45 MIL/uL — ABNORMAL LOW (ref 3.87–5.11)
RDW: 14.4 % (ref 11.5–15.5)
WBC: 8.4 10*3/uL (ref 4.0–10.5)

## 2011-03-12 LAB — URINALYSIS, ROUTINE W REFLEX MICROSCOPIC
Bilirubin Urine: NEGATIVE
Glucose, UA: >1000 mg/dL — AB
Ketones, ur: 15 mg/dL — AB
Leukocytes, UA: NEGATIVE
Nitrite: NEGATIVE
Protein, ur: 100 mg/dL — AB
Specific Gravity, Urine: 1.017 (ref 1.005–1.030)
Urobilinogen, UA: 1 mg/dL (ref 0.0–1.0)
pH: 7 (ref 5.0–8.0)

## 2011-03-12 LAB — COMPREHENSIVE METABOLIC PANEL WITH GFR
BUN: 19 mg/dL (ref 6–23)
CO2: 25 meq/L (ref 19–32)
Calcium: 9 mg/dL (ref 8.4–10.5)
Chloride: 105 meq/L (ref 96–112)
Creatinine, Ser: 1.45 mg/dL — ABNORMAL HIGH (ref 0.4–1.2)
GFR calc non Af Amer: 39 mL/min — ABNORMAL LOW (ref >60)
Glucose, Bld: 248 mg/dL — ABNORMAL HIGH (ref 70–99)
Total Bilirubin: 0.9 mg/dL (ref 0.3–1.2)

## 2011-03-12 LAB — URINE MICROSCOPIC-ADD ON

## 2011-03-12 LAB — DIFFERENTIAL
Basophils Absolute: 0 K/uL (ref 0.0–0.1)
Basophils Relative: 0 % (ref 0–1)
Eosinophils Absolute: 0 10*3/uL (ref 0.0–0.7)
Eosinophils Relative: 0 % (ref 0–5)
Lymphocytes Relative: 7 % — ABNORMAL LOW (ref 12–46)
Lymphs Abs: 0.6 10*3/uL — ABNORMAL LOW (ref 0.7–4.0)
Monocytes Absolute: 0.6 10*3/uL (ref 0.1–1.0)
Monocytes Relative: 7 % (ref 3–12)
Neutro Abs: 7.3 10*3/uL (ref 1.7–7.7)
Neutrophils Relative %: 87 % — ABNORMAL HIGH (ref 43–77)

## 2011-03-12 LAB — LIPASE, BLOOD: Lipase: 36 U/L (ref 11–59)

## 2011-03-12 LAB — KETONES, QUALITATIVE

## 2011-03-12 LAB — POCT PREGNANCY, URINE: Preg Test, Ur: NEGATIVE

## 2011-03-13 ENCOUNTER — Encounter: Payer: Self-pay | Admitting: Family Medicine

## 2011-03-13 NOTE — Assessment & Plan Note (Signed)
Continue Lantus 22units, Novolog 6units TID. Follow up in one month. Podiatry referral for diabetic foot exam. A1c at next visit.

## 2011-03-13 NOTE — Assessment & Plan Note (Signed)
Improved. Continue home medications. Reviewed red flags.

## 2011-03-13 NOTE — Assessment & Plan Note (Signed)
At goal.Continue medications as below. Reviewed dASH diet.

## 2011-03-13 NOTE — Progress Notes (Signed)
  Subjective:    Patient ID: [name] [name], female    DOB: [birth year], 45 y.o.   MRN: [id number]  HPI 1) Cyclic Emesis: Recently hospitalized for cyclic vomiting - patient was started on her usual IV medications and gradually responded. (Phenergan, Zofran, and Compazine) - at discharge she denied nausea and was doing well. Today she reports that she has been nauseated since leaving the hospital but has not thrown up. She is using her home medications with relief.   2) DM2: [name] [name] was started on Novolog 6 units with meals in addition to her Lantus (this was titrated back to 22 units daily) during her hospitalization, with the hope that this would prevent low blood sugars with her episodes of cyclic emesis. Since leaving the hospital she reports fasting blood sugars in the 200's as opposed to the 300's which is where she was at her last visit.   3) HTN: At goal of < 130/80 today. Denies chest pain, dyspnea, LE edema.    Reviewed problem list, tobacco history, allergies  Review of Systems Denies emesis, polyuria, polydispsia, vision change.    Objective:   Physical Exam  Constitutional: She is oriented to person, place, and time. She appears well-developed and well-nourished. No distress.  HENT:  Mouth/Throat: Oropharynx is clear and moist.  Neck: No JVD present.  Cardiovascular: Normal rate, regular rhythm, normal heart sounds and intact distal pulses.   Pulmonary/Chest: Effort normal and breath sounds normal.  Abdominal: Soft. Bowel sounds are normal. She exhibits no distension and no mass. There is tenderness. There is no rebound and no guarding.       Mild diffuse tenderness  Neurological: She is alert and oriented to person, place, and time.  Skin: Skin is warm and dry.          Assessment & Plan:

## 2011-03-14 ENCOUNTER — Emergency Department (HOSPITAL_COMMUNITY): Payer: Medicare Other

## 2011-03-14 ENCOUNTER — Inpatient Hospital Stay (HOSPITAL_COMMUNITY)
Admission: EM | Admit: 2011-03-14 | Discharge: 2011-03-18 | DRG: 392 | Disposition: A | Discharge status: Home / Self Care | Payer: Medicare Other | Attending: Internal Medicine | Admitting: Internal Medicine

## 2011-03-14 DIAGNOSIS — N179 Acute kidney failure, unspecified: Secondary | ICD-10-CM | POA: Diagnosis present

## 2011-03-14 DIAGNOSIS — K219 Gastro-esophageal reflux disease without esophagitis: Secondary | ICD-10-CM | POA: Diagnosis present

## 2011-03-14 DIAGNOSIS — I10 Essential (primary) hypertension: Secondary | ICD-10-CM | POA: Diagnosis present

## 2011-03-14 DIAGNOSIS — E860 Dehydration: Secondary | ICD-10-CM | POA: Diagnosis present

## 2011-03-14 DIAGNOSIS — M545 Low back pain, unspecified: Secondary | ICD-10-CM | POA: Diagnosis present

## 2011-03-14 DIAGNOSIS — F341 Dysthymic disorder: Secondary | ICD-10-CM | POA: Diagnosis present

## 2011-03-14 DIAGNOSIS — G8929 Other chronic pain: Secondary | ICD-10-CM | POA: Diagnosis present

## 2011-03-14 DIAGNOSIS — G43909 Migraine, unspecified, not intractable, without status migrainosus: Secondary | ICD-10-CM | POA: Diagnosis present

## 2011-03-14 DIAGNOSIS — G589 Mononeuropathy, unspecified: Secondary | ICD-10-CM | POA: Diagnosis present

## 2011-03-14 DIAGNOSIS — R1115 Cyclical vomiting syndrome unrelated to migraine: Principal | ICD-10-CM | POA: Diagnosis present

## 2011-03-14 DIAGNOSIS — D649 Anemia, unspecified: Secondary | ICD-10-CM | POA: Diagnosis present

## 2011-03-14 DIAGNOSIS — IMO0001 Reserved for inherently not codable concepts without codable children: Secondary | ICD-10-CM | POA: Diagnosis present

## 2011-03-14 LAB — DIFFERENTIAL
Basophils Absolute: 0 10*3/uL (ref 0.0–0.1)
Basophils Relative: 0 % (ref 0–1)
Basophils Relative: 0 % (ref 0–1)
Basophils Relative: 0 % (ref 0–1)
Eosinophils Absolute: 0 10*3/uL (ref 0.0–0.7)
Eosinophils Absolute: 0.2 10*3/uL (ref 0.0–0.7)
Eosinophils Relative: 0 % (ref 0–5)
Lymphocytes Relative: 7 % — ABNORMAL LOW (ref 12–46)
Lymphs Abs: 1.8 10*3/uL (ref 0.7–4.0)
Lymphs Abs: 2.8 10*3/uL (ref 0.7–4.0)
Monocytes Absolute: 0.5 10*3/uL (ref 0.1–1.0)
Monocytes Absolute: 0.8 10*3/uL (ref 0.1–1.0)
Monocytes Relative: 6 % (ref 3–12)
Monocytes Relative: 9 % (ref 3–12)
Neutro Abs: 5.7 10*3/uL (ref 1.7–7.7)

## 2011-03-14 LAB — COMPREHENSIVE METABOLIC PANEL
ALT: 13 U/L (ref 0–35)
ALT: 11 U/L (ref 0–35)
ALT: 16 U/L (ref 0–35)
AST: 16 U/L (ref 0–37)
Albumin: 4.1 g/dL (ref 3.5–5.2)
Albumin: 3.4 g/dL — ABNORMAL LOW (ref 3.5–5.2)
Alkaline Phosphatase: 57 U/L (ref 39–117)
BUN: 25 mg/dL — ABNORMAL HIGH (ref 6–23)
CO2: 25 mEq/L (ref 19–32)
Calcium: 10.1 mg/dL (ref 8.4–10.5)
Calcium: 9.6 mg/dL (ref 8.4–10.5)
GFR calc Af Amer: 40 mL/min — ABNORMAL LOW (ref >60)
GFR calc non Af Amer: 28 mL/min — ABNORMAL LOW (ref >60)
Glucose, Bld: 531 mg/dL — ABNORMAL HIGH (ref 70–99)
Glucose, Bld: 284 mg/dL — ABNORMAL HIGH (ref 70–99)
Potassium: 3.7 mEq/L (ref 3.5–5.1)
Sodium: 137 mEq/L (ref 135–145)
Sodium: 135 mEq/L (ref 135–145)
Sodium: 139 mEq/L (ref 135–145)
Total Protein: 8.4 g/dL — ABNORMAL HIGH (ref 6.0–8.3)
Total Protein: 6.3 g/dL (ref 6.0–8.3)

## 2011-03-14 LAB — RAPID URINE DRUG SCREEN, HOSP PERFORMED
Barbiturates: NOT DETECTED
Cocaine: NOT DETECTED
Opiates: NOT DETECTED

## 2011-03-14 LAB — URINALYSIS, ROUTINE W REFLEX MICROSCOPIC
Glucose, UA: NEGATIVE mg/dL
Ketones, ur: NEGATIVE mg/dL
Leukocytes, UA: NEGATIVE
Nitrite: NEGATIVE
Protein, ur: NEGATIVE mg/dL
Specific Gravity, Urine: 1.019 (ref 1.005–1.030)
pH: 8 (ref 5.0–8.0)
pH: 7 (ref 5.0–8.0)

## 2011-03-14 LAB — BILIRUBIN, DIRECT: Bilirubin, Direct: 0.1 mg/dL (ref 0.0–0.3)

## 2011-03-14 LAB — HEMOGLOBIN A1C
Hgb A1c MFr Bld: 11.5 % — ABNORMAL HIGH (ref <5.7)
Mean Plasma Glucose: 283 mg/dL — ABNORMAL HIGH (ref <117)

## 2011-03-14 LAB — GLUCOSE, CAPILLARY
Glucose-Capillary: 213 mg/dL — ABNORMAL HIGH (ref 70–99)
Glucose-Capillary: 208 mg/dL — ABNORMAL HIGH (ref 70–99)
Glucose-Capillary: 103 mg/dL — ABNORMAL HIGH (ref 70–99)
Glucose-Capillary: 127 mg/dL — ABNORMAL HIGH (ref 70–99)
Glucose-Capillary: 144 mg/dL — ABNORMAL HIGH (ref 70–99)
Glucose-Capillary: 170 mg/dL — ABNORMAL HIGH (ref 70–99)

## 2011-03-14 LAB — CBC
HCT: 38 % (ref 36.0–46.0)
Hemoglobin: 11.3 g/dL — ABNORMAL LOW (ref 12.0–15.0)
Hemoglobin: 13.3 g/dL (ref 12.0–15.0)
MCHC: 34.2 g/dL (ref 30.0–36.0)
MCHC: 34.2 g/dL (ref 30.0–36.0)
MCV: 99 fL (ref 78.0–100.0)
Platelets: 325 10*3/uL (ref 150–400)
Platelets: 169 10*3/uL (ref 150–400)
RBC: 3.94 MIL/uL (ref 3.87–5.11)
RDW: 13.5 % (ref 11.5–15.5)
RDW: 13.1 % (ref 11.5–15.5)

## 2011-03-14 LAB — LIPASE, BLOOD
Lipase: 28 U/L (ref 11–59)
Lipase: 53 U/L (ref 11–59)

## 2011-03-14 LAB — URINE MICROSCOPIC-ADD ON

## 2011-03-14 LAB — URINE CULTURE

## 2011-03-15 ENCOUNTER — Encounter: Payer: Medicare Other | Admitting: Physical Therapy

## 2011-03-15 LAB — BASIC METABOLIC PANEL
CO2: 26 mEq/L (ref 19–32)
Calcium: 7.7 mg/dL — ABNORMAL LOW (ref 8.4–10.5)
GFR calc Af Amer: 49 mL/min — ABNORMAL LOW (ref >60)
GFR calc non Af Amer: 40 mL/min — ABNORMAL LOW (ref >60)
Potassium: 3.7 mEq/L (ref 3.5–5.1)
Sodium: 133 mEq/L — ABNORMAL LOW (ref 135–145)

## 2011-03-15 LAB — CBC
HCT: 29.6 % — ABNORMAL LOW (ref 36.0–46.0)
Hemoglobin: 9.8 g/dL — ABNORMAL LOW (ref 12.0–15.0)
MCHC: 33.1 g/dL (ref 30.0–36.0)
WBC: 5.7 10*3/uL (ref 4.0–10.5)

## 2011-03-15 LAB — GLUCOSE, CAPILLARY
Glucose-Capillary: 129 mg/dL — ABNORMAL HIGH (ref 70–99)
Glucose-Capillary: 157 mg/dL — ABNORMAL HIGH (ref 70–99)

## 2011-03-16 LAB — GLUCOSE, CAPILLARY
Glucose-Capillary: 107 mg/dL — ABNORMAL HIGH (ref 70–99)
Glucose-Capillary: 158 mg/dL — ABNORMAL HIGH (ref 70–99)
Glucose-Capillary: 164 mg/dL — ABNORMAL HIGH (ref 70–99)
Glucose-Capillary: 172 mg/dL — ABNORMAL HIGH (ref 70–99)
Glucose-Capillary: 184 mg/dL — ABNORMAL HIGH (ref 70–99)
Glucose-Capillary: 184 mg/dL — ABNORMAL HIGH (ref 70–99)
Glucose-Capillary: 185 mg/dL — ABNORMAL HIGH (ref 70–99)
Glucose-Capillary: 197 mg/dL — ABNORMAL HIGH (ref 70–99)
Glucose-Capillary: 235 mg/dL — ABNORMAL HIGH (ref 70–99)
Glucose-Capillary: 262 mg/dL — ABNORMAL HIGH (ref 70–99)
Glucose-Capillary: 92 mg/dL (ref 70–99)
Glucose-Capillary: 96 mg/dL (ref 70–99)
Glucose-Capillary: 96 mg/dL (ref 70–99)
Glucose-Capillary: 98 mg/dL (ref 70–99)

## 2011-03-16 LAB — BASIC METABOLIC PANEL
BUN: 12 mg/dL (ref 6–23)
CO2: 26 mEq/L (ref 19–32)
Calcium: 9 mg/dL (ref 8.4–10.5)
Chloride: 106 mEq/L (ref 96–112)
Chloride: 107 mEq/L (ref 96–112)
Creatinine, Ser: 1.56 mg/dL — ABNORMAL HIGH (ref 0.4–1.2)
GFR calc Af Amer: 54 mL/min — ABNORMAL LOW (ref >60)
GFR calc Af Amer: 42 mL/min — ABNORMAL LOW (ref >60)
GFR calc non Af Amer: 35 mL/min — ABNORMAL LOW (ref >60)
GFR calc non Af Amer: 38 mL/min — ABNORMAL LOW (ref >60)
Glucose, Bld: 166 mg/dL — ABNORMAL HIGH (ref 70–99)
Glucose, Bld: 273 mg/dL — ABNORMAL HIGH (ref 70–99)
Potassium: 3.7 mEq/L (ref 3.5–5.1)
Potassium: 4.3 mEq/L (ref 3.5–5.1)
Potassium: 4.3 mEq/L (ref 3.5–5.1)
Sodium: 135 mEq/L (ref 135–145)
Sodium: 135 mEq/L (ref 135–145)

## 2011-03-16 LAB — CBC
HCT: 26.9 % — ABNORMAL LOW (ref 36.0–46.0)
Hemoglobin: 9.7 g/dL — ABNORMAL LOW (ref 12.0–15.0)
Hemoglobin: 9.3 g/dL — ABNORMAL LOW (ref 12.0–15.0)
Hemoglobin: 9.7 g/dL — ABNORMAL LOW (ref 12.0–15.0)
MCHC: 34.6 g/dL (ref 30.0–36.0)
MCV: 99.1 fL (ref 78.0–100.0)
Platelets: 202 10*3/uL (ref 150–400)
RBC: 3.12 MIL/uL — ABNORMAL LOW (ref 3.87–5.11)
RBC: 2.71 MIL/uL — ABNORMAL LOW (ref 3.87–5.11)
RBC: 2.77 MIL/uL — ABNORMAL LOW (ref 3.87–5.11)
RBC: 2.83 MIL/uL — ABNORMAL LOW (ref 3.87–5.11)
RDW: 13.8 % (ref 11.5–15.5)
WBC: 4.9 10*3/uL (ref 4.0–10.5)
WBC: 5.2 10*3/uL (ref 4.0–10.5)

## 2011-03-16 NOTE — H&P (Signed)
NAME:  [name] [name], [name] [name]               ACCOUNT NO.:  [id number]  MEDICAL RECORD NO.:  [id number]           PATIENT TYPE:  I  LOCATION:  [id number]                         FACILITY:  [facility]  PHYSICIAN:  Jacquelynn Cree, M.D.   DATE OF BIRTH:  1966/05/20  DATE OF ADMISSION:  03/14/2011 DATE OF DISCHARGE:                             HISTORY & PHYSICAL   PRIMARY CARE PHYSICIAN:  Mariana Arn, M.D., with family practice teaching service.  CHIEF COMPLAINT:  Vomiting x2 days.  HISTORY OF PRESENT ILLNESS:  The patient is a 45 year old female with a past medical history of cyclic vomiting syndrome and over 20 admission due to acute flares with intractable nausea and vomiting for the same who was most recently admitted on February 26, 2011, through March 03, 2011, for similar symptoms.  The patient claims that the regimen of scheduled dose Phenergan, Ativan, Dilaudid, and Reglan usually control her symptoms while in the hospital.  Of note, the patient has had a full GI evaluation by Dr. Henrene Pastor with no source of her symptoms ever elucidated. The patient claims that she has been unable to take her p.o. medications for the past 2 days due to intractable vomiting unrelieved by home doses of Phenergan.  Upon initial evaluation in the emergency department, the patient is noted to have malignant hypertension with a documented blood pressure of 216/115 and marked elevation of her blood glucose to 531. She is not acidotic, but has not been taking her insulin because she has been unable to eat.  The patient is currently complaining of abdominal pain, intractable nausea, and vomiting with mild hematemesis, but no diarrhea and no fever or chills.  PAST MEDICAL HISTORY: 1. Cyclic vomiting syndrome with over 20 hospital admissions for acute     flares. 2. Poorly controlled type 2 diabetes. 3. Hypertension. 4. History of anemia. 5. Peripheral neuropathy. 6. Depression/anxiety. 7. History of ulcerative  esophagitis. 8. Migraine headaches. 9. Gastroesophageal reflux disease. 10.History of pancreatitis. 11.Chronic low back pain.  PAST SURGICAL HISTORY: 1. Cholecystectomy. 2. Total abdominal hysterectomy and right salpingo-oophorectomy     secondary to tubo-ovarian abscess. 3. Exploratory laparoscopy with lysis of adhesions secondary to small     bowel obstruction.  FAMILY HISTORY:  The patient's mother died at 4 from an acute MI.  She was also diabetic.  The patient's father died when she was very young, reportedly of pneumonia.  She has 4 siblings with hypertension.  She has 4 offsprings.  SOCIAL HISTORY:  The patient is single and lives alone.  She has a 10th- grade level of education and has worked in [employer] in the most recent past.  She has a history of THC abuse as well as tobacco abuse, approximately 1 pack every 3 days.  Denies alcohol.  ALLERGIES:  E-Mycin causes nausea and vomiting.  Percocet causes itching.  CURRENT MEDICATIONS: 1. Percocet 5/325 mg 1 tablet p.o. q.6 h. p.r.n. 2. Benadryl 25 mg p.o. q.6 h p.r.n. 3. MiraLax 17 g p.o. b.i.d. p.r.n. 4. Aspirin 81 mg p.o. daily. 5. Ativan 1 mg p.o. q.6 h. p.r.n. 6. Coreg 25 mg p.o. b.i.d. 7. Flexeril 5  mg p.o. t.i.d. p.r.n. 8. Lantus 42 units subcutaneously q.h.s. 9. Remeron 30 mg p.o. q.h.s. 10.Phenergan 25 mg p.o. q.4 h. p.r.n. 11.Prilosec 20 mg p.o. daily.  REVIEW OF SYSTEMS:  Comprehensive 14-point review of systems is as described under the history of present illness.  Her past medical history is as noted.  A comprehensive review of systems is otherwise unremarkable.  PHYSICAL EXAMINATION:  GENERAL:  An obese African American female who is writhing in pain and claims to be insignificant distress. VITAL SIGNS:  Temperature 98.7, pulse 111, respirations 18, blood pressure on admission 216/115, currently 198/102. HEENT:  Normocephalic, atraumatic.  PERRL.  EOMI.  Oropharynx is clear. NECK:  Supple, no  thyromegaly, no lymphadenopathy, no jugular venous distention. CHEST:  Her lungs are clear to auscultation bilaterally.  Good air movement. HEART:  Tachycardic rate, regular rhythm.  No murmurs, rubs, or gallops. ABDOMEN:  Soft.  Diffusely tender.  Hyperactive bowel sounds.  Prominent midline incision scar from her umbilicus to the pubis.  EXTREMITIES:  No clubbing, edema, or cyanosis. SKIN:  Warm and dry.  No rashes. NEUROLOGIC:  The patient is alert and oriented x3.  Cranial nerves II through XII grossly intact.  Nonfocal.  PROCEDURES AND DIAGNOSTIC STUDIES:  Acute abdominal series shows no evidence of active pulmonary disease.  Nonobstructive bowel gas pattern.  LABORATORY DATA:  Lipase is 28.  Sodium is 137, potassium 3.9, chloride 94, bicarbonate 29, BUN 25, creatinine 2.26, glucose 531.  Liver function studies are within normal limits.  White blood cell count is 8.1, hemoglobin 13, hematocrit 38.0, platelets 325.  ASSESSMENT/PLAN: 1. Cyclic vomiting syndrome with acute flare:  We will admit the     patient and start her on scheduled doses of Ativan, Dilaudid, and     Phenergan IV.  Reglan will be provided IV if it is available, if     not, we will try p.o.  The patient has had an extensive     gastrointestinal evaluation in the past and there is no utility to     repeating any further diagnostic testing at this time. 2. Acute renal failure:  The patient's acute renal failure is likely     from vomiting and dehydration.  We will keep her n.p.o. and hydrate     her. 3. Uncontrolled type 2 diabetes:  The patient will be placed on     insulin drip to get her blood glucoses under better control.  The     last hemoglobin A1c was done in February, 10.3.  We will repeat     this and attempt better glycemic control. 4. Accelerated hypertension:  We will place the patient on IV     metoprolol and a clonidine patch for blood pressure control. 5. History of THC abuse:  We will check a  urine drug screen. 6. Gastroesophageal reflux disease with history of ulcerative     esophagitis:  We will place the patient on IV proton pump inhibitor     therapy. 7. Prophylaxis:  Use PAS hoses for deep vein thrombosis prophylaxis     given the fact that she is having some mild hematemesis.  Time spent on admission including face-to-face time equals approximately 1 hour.     Jacquelynn Cree, M.D.     CR/MEDQ  D:  03/14/2011  T:  03/15/2011  Job:  [id number]  cc:   Mariana Arn, MD  Electronically Signed by Jacquelynn Cree M.D. on 03/16/2011 03:31:06 PM

## 2011-03-17 ENCOUNTER — Encounter: Payer: Medicare Other | Admitting: Physical Therapy

## 2011-03-17 ENCOUNTER — Other Ambulatory Visit: Payer: Self-pay | Admitting: Family Medicine

## 2011-03-17 DIAGNOSIS — E1165 Type 2 diabetes mellitus with hyperglycemia: Secondary | ICD-10-CM

## 2011-03-17 LAB — URINALYSIS, ROUTINE W REFLEX MICROSCOPIC
Ketones, ur: 15 mg/dL — AB
Leukocytes, UA: NEGATIVE
Nitrite: NEGATIVE
Urobilinogen, UA: 0.2 mg/dL (ref 0.0–1.0)
pH: 6 (ref 5.0–8.0)

## 2011-03-17 LAB — COMPREHENSIVE METABOLIC PANEL
ALT: 25 U/L (ref 0–35)
ALT: 33 U/L (ref 0–35)
AST: 18 U/L (ref 0–37)
AST: 23 U/L (ref 0–37)
Albumin: 3.8 g/dL (ref 3.5–5.2)
Albumin: 4.5 g/dL (ref 3.5–5.2)
Alkaline Phosphatase: 70 U/L (ref 39–117)
Alkaline Phosphatase: 79 U/L (ref 39–117)
Alkaline Phosphatase: 82 U/L (ref 39–117)
BUN: 25 mg/dL — ABNORMAL HIGH (ref 6–23)
BUN: 30 mg/dL — ABNORMAL HIGH (ref 6–23)
BUN: 31 mg/dL — ABNORMAL HIGH (ref 6–23)
CO2: 24 mEq/L (ref 19–32)
CO2: 26 mEq/L (ref 19–32)
Calcium: 8.8 mg/dL (ref 8.4–10.5)
Calcium: 9.8 mg/dL (ref 8.4–10.5)
Chloride: 105 mEq/L (ref 96–112)
Chloride: 99 mEq/L (ref 96–112)
Chloride: 99 mEq/L (ref 96–112)
Creatinine, Ser: 1.51 mg/dL — ABNORMAL HIGH (ref 0.4–1.2)
Creatinine, Ser: 1.6 mg/dL — ABNORMAL HIGH (ref 0.4–1.2)
GFR calc Af Amer: 42 mL/min — ABNORMAL LOW (ref >60)
GFR calc Af Amer: 45 mL/min — ABNORMAL LOW (ref >60)
GFR calc non Af Amer: 35 mL/min — ABNORMAL LOW (ref >60)
GFR calc non Af Amer: 37 mL/min — ABNORMAL LOW (ref >60)
Glucose, Bld: 306 mg/dL — ABNORMAL HIGH (ref 70–99)
Glucose, Bld: 335 mg/dL — ABNORMAL HIGH (ref 70–99)
Glucose, Bld: 535 mg/dL — ABNORMAL HIGH (ref 70–99)
Potassium: 3.7 mEq/L (ref 3.5–5.1)
Potassium: 4 mEq/L (ref 3.5–5.1)
Potassium: 4.3 mEq/L (ref 3.5–5.1)
Sodium: 133 mEq/L — ABNORMAL LOW (ref 135–145)
Sodium: 133 mEq/L — ABNORMAL LOW (ref 135–145)
Total Bilirubin: 0.4 mg/dL (ref 0.3–1.2)
Total Bilirubin: 0.6 mg/dL (ref 0.3–1.2)
Total Bilirubin: 0.6 mg/dL (ref 0.3–1.2)
Total Protein: 7.2 g/dL (ref 6.0–8.3)
Total Protein: 8.3 g/dL (ref 6.0–8.3)

## 2011-03-17 LAB — GLUCOSE, CAPILLARY
Glucose-Capillary: 67 mg/dL — ABNORMAL LOW (ref 70–99)
Glucose-Capillary: 105 mg/dL — ABNORMAL HIGH (ref 70–99)
Glucose-Capillary: 131 mg/dL — ABNORMAL HIGH (ref 70–99)
Glucose-Capillary: 140 mg/dL — ABNORMAL HIGH (ref 70–99)
Glucose-Capillary: 146 mg/dL — ABNORMAL HIGH (ref 70–99)
Glucose-Capillary: 147 mg/dL — ABNORMAL HIGH (ref 70–99)
Glucose-Capillary: 150 mg/dL — ABNORMAL HIGH (ref 70–99)
Glucose-Capillary: 160 mg/dL — ABNORMAL HIGH (ref 70–99)
Glucose-Capillary: 160 mg/dL — ABNORMAL HIGH (ref 70–99)
Glucose-Capillary: 162 mg/dL — ABNORMAL HIGH (ref 70–99)
Glucose-Capillary: 167 mg/dL — ABNORMAL HIGH (ref 70–99)
Glucose-Capillary: 169 mg/dL — ABNORMAL HIGH (ref 70–99)
Glucose-Capillary: 171 mg/dL — ABNORMAL HIGH (ref 70–99)
Glucose-Capillary: 175 mg/dL — ABNORMAL HIGH (ref 70–99)
Glucose-Capillary: 19 mg/dL — CL (ref 70–99)
Glucose-Capillary: 205 mg/dL — ABNORMAL HIGH (ref 70–99)
Glucose-Capillary: 208 mg/dL — ABNORMAL HIGH (ref 70–99)
Glucose-Capillary: 315 mg/dL — ABNORMAL HIGH (ref 70–99)
Glucose-Capillary: 45 mg/dL — ABNORMAL LOW (ref 70–99)
Glucose-Capillary: 64 mg/dL — ABNORMAL LOW (ref 70–99)
Glucose-Capillary: 131 mg/dL — ABNORMAL HIGH (ref 70–99)
Glucose-Capillary: 104 mg/dL — ABNORMAL HIGH (ref 70–99)
Glucose-Capillary: 242 mg/dL — ABNORMAL HIGH (ref 70–99)

## 2011-03-17 LAB — POCT I-STAT, CHEM 8
Hemoglobin: 11.2 g/dL — ABNORMAL LOW (ref 12.0–15.0)
Sodium: 138 mEq/L (ref 135–145)
TCO2: 25 mmol/L (ref 0–100)

## 2011-03-17 LAB — DIFFERENTIAL
Basophils Absolute: 0 10*3/uL (ref 0.0–0.1)
Basophils Absolute: 0 10*3/uL (ref 0.0–0.1)
Basophils Absolute: 0 10*3/uL (ref 0.0–0.1)
Basophils Relative: 0 % (ref 0–1)
Eosinophils Absolute: 0.1 10*3/uL (ref 0.0–0.7)
Eosinophils Absolute: 0 10*3/uL (ref 0.0–0.7)
Eosinophils Relative: 1 % (ref 0–5)
Eosinophils Relative: 0 % (ref 0–5)
Lymphocytes Relative: 18 % (ref 12–46)
Lymphocytes Relative: 15 % (ref 12–46)
Lymphocytes Relative: 18 % (ref 12–46)
Lymphs Abs: 1.4 10*3/uL (ref 0.7–4.0)
Lymphs Abs: 1.5 10*3/uL (ref 0.7–4.0)
Monocytes Absolute: 0.5 10*3/uL (ref 0.1–1.0)
Monocytes Relative: 5 % (ref 3–12)
Monocytes Relative: 6 % (ref 3–12)
Neutro Abs: 7.6 10*3/uL (ref 1.7–7.7)
Neutrophils Relative %: 79 % — ABNORMAL HIGH (ref 43–77)
Smear Review: ADEQUATE

## 2011-03-17 LAB — CBC
HCT: 32 % — ABNORMAL LOW (ref 36.0–46.0)
HCT: 32.5 % — ABNORMAL LOW (ref 36.0–46.0)
HCT: 37 % (ref 36.0–46.0)
Hemoglobin: 11.1 g/dL — ABNORMAL LOW (ref 12.0–15.0)
MCHC: 34 g/dL (ref 30.0–36.0)
MCHC: 34.2 g/dL (ref 30.0–36.0)
MCV: 97.8 fL (ref 78.0–100.0)
MCV: 98 fL (ref 78.0–100.0)
MCV: 98.5 fL (ref 78.0–100.0)
MCV: 98.9 fL (ref 78.0–100.0)
Platelets: 159 10*3/uL (ref 150–400)
Platelets: 154 10*3/uL (ref 150–400)
Platelets: 173 10*3/uL (ref 150–400)
Platelets: 180 10*3/uL (ref 150–400)
Platelets: 196 10*3/uL (ref 150–400)
RBC: 3.32 MIL/uL — ABNORMAL LOW (ref 3.87–5.11)
RDW: 13.2 % (ref 11.5–15.5)
RDW: 13.7 % (ref 11.5–15.5)
RDW: 13.7 % (ref 11.5–15.5)
RDW: 14 % (ref 11.5–15.5)
WBC: 4.8 10*3/uL (ref 4.0–10.5)
WBC: 7.5 10*3/uL (ref 4.0–10.5)
WBC: 8.2 10*3/uL (ref 4.0–10.5)

## 2011-03-17 LAB — URINALYSIS, MICROSCOPIC ONLY
Bilirubin Urine: NEGATIVE
Glucose, UA: >1000 mg/dL — AB
Ketones, ur: NEGATIVE mg/dL
Leukocytes, UA: NEGATIVE
Nitrite: NEGATIVE
Protein, ur: 100 mg/dL — AB
Specific Gravity, Urine: 1.018 (ref 1.005–1.030)
Urobilinogen, UA: 1 mg/dL (ref 0.0–1.0)
pH: 5.5 (ref 5.0–8.0)

## 2011-03-17 LAB — BASIC METABOLIC PANEL
BUN: 6 mg/dL (ref 6–23)
BUN: 13 mg/dL (ref 6–23)
Calcium: 8.2 mg/dL — ABNORMAL LOW (ref 8.4–10.5)
Creatinine, Ser: 1.26 mg/dL — ABNORMAL HIGH (ref 0.4–1.2)
Creatinine, Ser: 1.7 mg/dL — ABNORMAL HIGH (ref 0.4–1.2)
GFR calc non Af Amer: 46 mL/min — ABNORMAL LOW (ref >60)
GFR calc non Af Amer: 33 mL/min — ABNORMAL LOW (ref >60)
Glucose, Bld: 93 mg/dL (ref 70–99)
Glucose, Bld: 292 mg/dL — ABNORMAL HIGH (ref 70–99)
Potassium: 3.6 mEq/L (ref 3.5–5.1)

## 2011-03-17 LAB — LIPASE, BLOOD
Lipase: 23 U/L (ref 11–59)
Lipase: 38 U/L (ref 11–59)
Lipase: 489 U/L — ABNORMAL HIGH (ref 11–59)

## 2011-03-17 LAB — URINE CULTURE: Colony Count: >=100000

## 2011-03-17 LAB — TROPONIN I: Troponin I: 0.01 ng/mL (ref 0.00–0.06)

## 2011-03-17 LAB — CARDIAC PANEL(CRET KIN+CKTOT+MB+TROPI): CK, MB: 1.9 ng/mL (ref 0.3–4.0)

## 2011-03-17 LAB — URINE MICROSCOPIC-ADD ON

## 2011-03-17 LAB — CK TOTAL AND CKMB (NOT AT ARMC): Total CK: 106 U/L (ref 7–177)

## 2011-03-18 LAB — GLUCOSE, CAPILLARY: Glucose-Capillary: 253 mg/dL — ABNORMAL HIGH (ref 70–99)

## 2011-03-19 LAB — DIFFERENTIAL
Basophils Relative: 1 % (ref 0–1)
Eosinophils Absolute: 0.1 10*3/uL (ref 0.0–0.7)
Eosinophils Relative: 2 % (ref 0–5)
Monocytes Absolute: 0.8 10*3/uL (ref 0.1–1.0)
Monocytes Relative: 12 % (ref 3–12)
Neutro Abs: 3.2 10*3/uL (ref 1.7–7.7)

## 2011-03-19 LAB — BASIC METABOLIC PANEL
CO2: 23 mEq/L (ref 19–32)
Calcium: 7.6 mg/dL — ABNORMAL LOW (ref 8.4–10.5)
Calcium: 7.6 mg/dL — ABNORMAL LOW (ref 8.4–10.5)
GFR calc Af Amer: >60 mL/min (ref >60)
GFR calc Af Amer: >60 mL/min (ref >60)
GFR calc non Af Amer: 50 mL/min — ABNORMAL LOW (ref >60)
Glucose, Bld: 280 mg/dL — ABNORMAL HIGH (ref 70–99)
Potassium: 3.9 mEq/L (ref 3.5–5.1)
Sodium: 134 mEq/L — ABNORMAL LOW (ref 135–145)
Sodium: 143 mEq/L (ref 135–145)

## 2011-03-19 LAB — URINALYSIS, ROUTINE W REFLEX MICROSCOPIC
Glucose, UA: 500 mg/dL — AB
Protein, ur: 100 mg/dL — AB
Specific Gravity, Urine: 1.021 (ref 1.005–1.030)
Urobilinogen, UA: 1 mg/dL (ref 0.0–1.0)

## 2011-03-19 LAB — COMPREHENSIVE METABOLIC PANEL
ALT: 11 U/L (ref 0–35)
AST: 15 U/L (ref 0–37)
Albumin: 4 g/dL (ref 3.5–5.2)
Alkaline Phosphatase: 63 U/L (ref 39–117)
Chloride: 103 mEq/L (ref 96–112)
Potassium: 3.9 mEq/L (ref 3.5–5.1)
Sodium: 132 mEq/L — ABNORMAL LOW (ref 135–145)
Total Bilirubin: 0.6 mg/dL (ref 0.3–1.2)
Total Protein: 7.4 g/dL (ref 6.0–8.3)

## 2011-03-19 LAB — GLUCOSE, CAPILLARY
Glucose-Capillary: 112 mg/dL — ABNORMAL HIGH (ref 70–99)
Glucose-Capillary: 114 mg/dL — ABNORMAL HIGH (ref 70–99)
Glucose-Capillary: 180 mg/dL — ABNORMAL HIGH (ref 70–99)
Glucose-Capillary: 303 mg/dL — ABNORMAL HIGH (ref 70–99)
Glucose-Capillary: 94 mg/dL (ref 70–99)

## 2011-03-19 LAB — URINE MICROSCOPIC-ADD ON

## 2011-03-19 LAB — URINE DRUGS OF ABUSE SCREEN W ALC, ROUTINE (REF LAB)
Amphetamine Screen, Ur: NEGATIVE
Barbiturate Quant, Ur: NEGATIVE
Creatinine,U: 91.5 mg/dL
Ethyl Alcohol: <10 mg/dL (ref <10)
Marijuana Metabolite: POSITIVE — AB
Opiate Screen, Urine: POSITIVE — AB

## 2011-03-19 LAB — LIPASE, BLOOD: Lipase: 25 U/L (ref 11–59)

## 2011-03-19 LAB — LIPID PANEL
LDL Cholesterol: 67 mg/dL (ref 0–99)
Triglycerides: 70 mg/dL (ref <150)
VLDL: 14 mg/dL (ref 0–40)

## 2011-03-19 LAB — PREGNANCY, URINE: Preg Test, Ur: NEGATIVE

## 2011-03-19 LAB — CBC
Platelets: 197 10*3/uL (ref 150–400)
RDW: 13.3 % (ref 11.5–15.5)
WBC: 6.9 10*3/uL (ref 4.0–10.5)

## 2011-03-19 LAB — THC (MARIJUANA), URINE, CONFIRMATION: Marijuana, Ur-Confirmation: 257 NG/ML — ABNORMAL HIGH

## 2011-03-19 LAB — OPIATE, QUANTITATIVE, URINE: Oxymorphone: NEGATIVE NG/ML

## 2011-03-22 ENCOUNTER — Emergency Department (HOSPITAL_COMMUNITY): Payer: Medicare Other

## 2011-03-22 ENCOUNTER — Other Ambulatory Visit: Payer: Self-pay | Admitting: Family Medicine

## 2011-03-22 ENCOUNTER — Inpatient Hospital Stay (HOSPITAL_COMMUNITY)
Admission: EM | Admit: 2011-03-22 | Discharge: 2011-03-26 | DRG: 392 | Disposition: A | Discharge status: Home / Self Care | Payer: Medicare Other | Attending: Family Medicine | Admitting: Family Medicine

## 2011-03-22 ENCOUNTER — Encounter: Payer: Self-pay | Admitting: Family Medicine

## 2011-03-22 DIAGNOSIS — E1149 Type 2 diabetes mellitus with other diabetic neurological complication: Secondary | ICD-10-CM | POA: Diagnosis present

## 2011-03-22 DIAGNOSIS — D649 Anemia, unspecified: Secondary | ICD-10-CM | POA: Diagnosis present

## 2011-03-22 DIAGNOSIS — R1115 Cyclical vomiting syndrome unrelated to migraine: Principal | ICD-10-CM | POA: Diagnosis present

## 2011-03-22 DIAGNOSIS — Z7982 Long term (current) use of aspirin: Secondary | ICD-10-CM

## 2011-03-22 DIAGNOSIS — E1142 Type 2 diabetes mellitus with diabetic polyneuropathy: Secondary | ICD-10-CM | POA: Diagnosis present

## 2011-03-22 DIAGNOSIS — N179 Acute kidney failure, unspecified: Secondary | ICD-10-CM | POA: Diagnosis present

## 2011-03-22 DIAGNOSIS — G4700 Insomnia, unspecified: Secondary | ICD-10-CM | POA: Diagnosis present

## 2011-03-22 DIAGNOSIS — M549 Dorsalgia, unspecified: Secondary | ICD-10-CM | POA: Diagnosis present

## 2011-03-22 DIAGNOSIS — K219 Gastro-esophageal reflux disease without esophagitis: Secondary | ICD-10-CM | POA: Diagnosis present

## 2011-03-22 DIAGNOSIS — I10 Essential (primary) hypertension: Secondary | ICD-10-CM | POA: Diagnosis present

## 2011-03-22 DIAGNOSIS — F17200 Nicotine dependence, unspecified, uncomplicated: Secondary | ICD-10-CM | POA: Diagnosis present

## 2011-03-22 DIAGNOSIS — Z794 Long term (current) use of insulin: Secondary | ICD-10-CM

## 2011-03-22 DIAGNOSIS — R1013 Epigastric pain: Secondary | ICD-10-CM | POA: Diagnosis present

## 2011-03-22 LAB — CBC
HCT: 38.3 % (ref 36.0–46.0)
Hemoglobin: 13.6 g/dL (ref 12.0–15.0)
MCH: 32.5 pg (ref 26.0–34.0)
MCHC: 35.5 g/dL (ref 30.0–36.0)

## 2011-03-22 LAB — URINALYSIS, ROUTINE W REFLEX MICROSCOPIC
Bilirubin Urine: NEGATIVE
Protein, ur: >300 mg/dL — AB
Urobilinogen, UA: 0.2 mg/dL (ref 0.0–1.0)

## 2011-03-22 LAB — GASTRIC OCCULT BLOOD (1-CARD TO LAB): Occult Blood, Gastric: POSITIVE — AB

## 2011-03-22 LAB — COMPREHENSIVE METABOLIC PANEL
ALT: 16 U/L (ref 0–35)
AST: 22 U/L (ref 0–37)
Albumin: 4.7 g/dL (ref 3.5–5.2)
CO2: 24 mEq/L (ref 19–32)
Calcium: 10.3 mg/dL (ref 8.4–10.5)
Chloride: 98 mEq/L (ref 96–112)
GFR calc Af Amer: 38 mL/min — ABNORMAL LOW (ref >60)
GFR calc non Af Amer: 32 mL/min — ABNORMAL LOW (ref >60)
Sodium: 137 mEq/L (ref 135–145)

## 2011-03-22 LAB — DIFFERENTIAL
Lymphocytes Relative: 21 % (ref 12–46)
Monocytes Absolute: 0.5 10*3/uL (ref 0.1–1.0)
Monocytes Relative: 5 % (ref 3–12)
Neutro Abs: 6.6 10*3/uL (ref 1.7–7.7)

## 2011-03-22 LAB — POCT CARDIAC MARKERS: Myoglobin, poc: 168 ng/mL (ref 12–200)

## 2011-03-22 LAB — GLUCOSE, CAPILLARY: Glucose-Capillary: 246 mg/dL — ABNORMAL HIGH (ref 70–99)

## 2011-03-22 NOTE — H&P (Signed)
[facility] Admission History and Physical  Patient name: [name] [name] Medical record number: [id number] Date of birth: 01-15-1966 Age: 45 y.o. Gender: female  Primary Care Provider: Mariana Arn, MD  Chief Complaint: N/V and epigastric abd pain History of Present Illness: [name] [name] is a 45 y.o. year old female presenting with nausea and vomiting and epigastric abdominal pain that started at 6am this morning.  Pt describes epigastric pain as sharpe and "burning." Nothing seems to relieve symptoms, used ativan and phenergan.  Pt states that her vomit appeared to have blood in it.  She states that this episode of abd pain and vomiting is similar to past episodes.  This pt is very well known to our service.  Pt had 2 recent admissions this month for the same complaints.  No fever. No diarrhea.  No dizziness. No syncope.  No urinary symptoms.   No blood in stool.    Patient Active Problem List  Diagnoses  . NEUROPATHY, DIABETIC  . DIABETES MELLITUS, II, COMPLICATIONS  . UNSPECIFIED ANEMIA  . DEPRESSION, MAJOR, RECURRENT  . ANXIETY  . TOBACCO DEPENDENCE  . MIGRAINE, UNSPEC., W/O INTRACTABLE MIGRAINE  . HYPERTENSION, BENIGN SYSTEMIC  . GERD  . PERSISTENT VOMITING  . PANCREATITIS  . UNSPECIFIED VAGINITIS AND VULVOVAGINITIS  . VAGINAL PRURITUS  . CALLUSES, FEET, BILATERAL  . BACK PAIN, CHRONIC  . INSOMNIA NOS  . ABDOMINAL PAIN  . HIGH RISK PATIENT  . MUSCLE SPASM, TRAPEZIUS   Past Medical History: 1. Cyclic vomiting syndrome with over 20 hospital admissions for acute      flares.   2. Poorly controlled type 2 diabetes.   3. Hypertension.   4. History of anemia.   5. Peripheral neuropathy.   6. Depression/anxiety.   7. History of ulcerative esophagitis.   8. Migraine headaches.   9. Gastroesophageal reflux disease.   10.History of pancreatitis.   11.Chronic low back pain.   Past Surgical History:  1. Cholecystectomy.   2. Total abdominal  hysterectomy and right salpingo-oophorectomy       secondary to tubo-ovarian abscess.   3. Exploratory laparoscopy with lysis of adhesions secondary to small       bowel obstruction  Social History: The patient is single and lives alone.  She has a 10th-   grade level of education and has worked in [employer] in the most recent   past.  She has a history of THC abuse as well as tobacco abuse,   approximately 1 pack every 3 days.  Denies alcohol.  History   Social History  . Marital Status: Single    Spouse Name: N/A    Number of Children: N/A  . Years of Education: N/A   Social History Main Topics  . Smoking status: Current Everyday Smoker -- 0.3 packs/day    Types: Cigarettes  . Smokeless tobacco: Never Used  . Alcohol Use: Not on file  . Drug Use: Not on file  . Sexually Active: Not on file   Other Topics Concern  . Not on file   Social History Narrative  . No narrative on file    Family History:  The patient has diabetes in first-degree relative and   mother died of MI in 2003-03-25.  Siblings with hypertension.  No family   history of cyclic vomiting or migraine or colon cancer.  Allergies: Allergies  Allergen Reactions  . Erythromycin Nausea And Vomiting  . Oxycodone-Acetaminophen Itching    Current Outpatient Prescriptions  Medication  Sig Dispense Refill  . amLODipine (NORVASC) 10 MG tablet Take 10 mg by mouth daily.        Marland Kitchen aspirin 81 MG tablet Take 81 mg by mouth daily.        . carvedilol (COREG) 25 MG tablet Take 25 mg by mouth 2 (two) times daily with meals.        . cyclobenzaprine (FLEXERIL) 5 MG tablet Take 2 tablets (10 mg total) by mouth 3 (three) times daily as needed.  21 tablet  4  . docusate sodium (COLACE) 100 MG capsule Take 100 mg by mouth 2 (two) times daily as needed.        Marland Kitchen EPINEPHrine (EPIPEN) 0.3 MG/0.3ML DEVI One dose Im as needed for severe anaphylaxis.       Marland Kitchen HYDROcodone-acetaminophen (VICODIN) 5-500 MG per tablet Take 1-2 tablets by  mouth every 6 (six) hours.  30 tablet  2  . insulin aspart (NOVOLOG) 100 UNIT/ML injection Inject 6 Units into the skin 3 (three) times daily before meals.        . insulin glargine (LANTUS) 100 UNIT/ML injection Inject 30 Units into the skin every morning. Take 22 Units each morning. Increase by 1 Unit a day for each fasting blood glucose over 110.      Marland Kitchen LORazepam (ATIVAN) 1 MG tablet Take 1 mg by mouth every 6 (six) hours as needed. For nausea.       . metFORMIN (GLUCOPHAGE) 1000 MG tablet       . mirtazapine (REMERON) 30 MG tablet Take 30 mg by mouth at bedtime.        Marland Kitchen omeprazole (PRILOSEC) 20 MG capsule Take 20 mg by mouth daily.        . polyethylene glycol (MIRALAX) powder Mix packet with water once daily as needed for constipation.       . promethazine (PHENERGAN) 25 MG suppository Place 25 mg rectally every 6 (six) hours as needed.        . promethazine (PHENERGAN) 25 MG tablet Take 25 mg by mouth every 4 (four) hours. At first sign of nausea.        Marland Kitchen PROMETHAZINE HCL IM Inject 25 mg (50mL) IM every 6 hours as needed.       patient did not know current medication list-  Office med list (above) is different than d/c med list from last admission at [facility].    Review Of Systems: Per HPI with the following additions: Otherwise 12 point review of systems was performed and was unremarkable.  Physical Exam:               VITALS:  Blood pressure 195/82, pulse 95,  respirations 16, O2 sats 100% on room air, afebrile.   GENERAL APPEARANCE:  Appears uncomfortable, is cooperative with exam.   HEENT:  PERRLA.  EOMI.  Mucous membranes moist.   CARDIOVASCULAR:  1/6 systolic ejection murmur heard at left lower  sternal border with regular rate and rhythm and no edema  LUNGS:  Clear to auscultation bilaterally.  No wheezes or rales.  Breathing unlabored.   ABDOMEN:  No CVA tenderness.  Bowel sounds present, tenderness present in epigastric area, no rebound. No guarding. No masses.   EXTREMITIES:  No lower extremity edema or skin breakdown.   SKIN:  No rashes.   NEUROLOGY:  Alert and oriented x3. CN II-XII grossly intact.  No gross deficits in motor or sensory functions.   Labs and Imaging: Lab Results  Component  Value Date/Time   NA 137 03/22/2011 11:40 AM   K 3.8 03/22/2011 11:40 AM   CL 98 03/22/2011 11:40 AM   CO2 24 03/22/2011 11:40 AM   BUN 25* 03/22/2011 11:40 AM   CREATININE 1.73* 03/22/2011 11:40 AM   GLUCOSE 259* 03/22/2011 11:40 AM   Lab Results  Component Value Date   WBC 9.1 03/22/2011   HGB 13.6 03/22/2011   HCT 38.3 03/22/2011   MCV 91.4 03/22/2011   PLT 248 03/22/2011   abd 2 view and chest 1 view:   1.  No active cardiopulmonary disease in one-view.   2.  No acute or specific abdominal findings.   Assessment and Plan: 1. Vomiting.  Presentation appears consistent with recurrence of cyclic vomiting.  The patient is unable to identify trigger.  She   denies current marijuana abuse and no other medication exposures.     In the emergency department, was administered Zofran, dilaudid, ativan  with minimal clinical improvement, and the patient is still actively vomiting, therefore we will try Phenergan as this seems to help her the most in the past.  We will administer this IV.  Also consider addition of Reglan; however, medication is on national back order and not available currently.  Associated abdominal pain seems to be relieved with Dilaudid, therefore we will continue this as well.  On ativan at home for nausea.  Will continue IV ativan but will switch to po when pt able to tolerate po's. We will keep the patient n.p.o. and advance diet as tolerated likely no sooner than tomorrow a.m.  The patient is not clinically  dehydrated at this time, but will start maintenance IV fluids.   2. Diabetes mellitus.  Is hyperglycemic likely due to noncompliance with stable insulin dosing.  Has a mild anion gap of 15 notably,   which may also be contributing to her nausea and  vomiting.  We will start on partial dose of Lantus at 5 units daily for now and institute senstive sliding scale.  Plan to restart home dose of Lantus once the patient is tolerating oral intake.   3. Hypertension.  The patient cannot take home antihypertensives due  to vomiting.  Will treat pain with dilaudid and vomiting with phenergan and ativan.  In past hospitalizations hypertension resolved with control of symptoms.  Also a discrepency is present between office med list and last d/c summary med list so need to have pharmacy help with med rec to see what pt truly is taking as home bp medication.  Can use metoprolol 5 mg IV q.8 h.  p.r.n. for systolic blood pressures greater than 180 if bp not improved after control of symptoms obtained.   4. Acute renal failure.  The patient's creatinine is mildly elevated at 1.73 with last known values from hospital admission being 1.6.  This may be an increase over her baseline and we will monitor with IV fluid hydration.   5. FEN.  The patient will be n.p.o. for cyclic vomiting and may advance diet as tolerated.  We will administer normal saline and start 100 mL/hour.  Electrolytes currently normal.  We will follow up with BMET in the a.m.   6. Prophylaxis.  Heparin 5000units TID and PPI daily.   7. Disposition pending clinical improvement.

## 2011-03-22 NOTE — H&P (Signed)
[facility] Admission History and Physical  Patient name: [name] [name] Medical record number: [id number] Date of birth: 16-Nov-1966 Age: 45 y.o. Gender: female  Primary Care Provider: Mariana Arn, MD  Chief Complaint: abdominal pain and chest pain History of Present Illness: [name] [name] is a 45 y.o. year old female presenting with nausea and vomiting and abdominal pain.  This pt is very well known to our service.  Pt had 2 recent admission this month for the same complaints.    Patient Active Problem List  Diagnoses  . NEUROPATHY, DIABETIC  . DIABETES MELLITUS, II, COMPLICATIONS  . UNSPECIFIED ANEMIA  . DEPRESSION, MAJOR, RECURRENT  . ANXIETY  . TOBACCO DEPENDENCE  . MIGRAINE, UNSPEC., W/O INTRACTABLE MIGRAINE  . HYPERTENSION, BENIGN SYSTEMIC  . GERD  . PERSISTENT VOMITING  . PANCREATITIS  . UNSPECIFIED VAGINITIS AND VULVOVAGINITIS  . VAGINAL PRURITUS  . CALLUSES, FEET, BILATERAL  . BACK PAIN, CHRONIC  . INSOMNIA NOS  . ABDOMINAL PAIN  . HIGH RISK PATIENT  . MUSCLE SPASM, TRAPEZIUS   Past Medical History: 1. Cyclic vomiting syndrome with over 20 hospital admissions for acute      flares.   2. Poorly controlled type 2 diabetes.   3. Hypertension.   4. History of anemia.   5. Peripheral neuropathy.   6. Depression/anxiety.   7. History of ulcerative esophagitis.   8. Migraine headaches.   9. Gastroesophageal reflux disease.   10.History of pancreatitis.   11.Chronic low back pain.   Past Surgical History:  1. Cholecystectomy.   2. Total abdominal hysterectomy and right salpingo-oophorectomy       secondary to tubo-ovarian abscess.   3. Exploratory laparoscopy with lysis of adhesions secondary to small       bowel obstruction  Social History: The patient is single and lives alone.  She has a 10th-   grade level of education and has worked in [employer] in the most recent   past.  She has a history of THC abuse as well as tobacco  abuse,   approximately 1 pack every 3 days.  Denies alcohol.  History   Social History  . Marital Status: Single    Spouse Name: N/A    Number of Children: N/A  . Years of Education: N/A   Social History Main Topics  . Smoking status: Current Everyday Smoker -- 0.3 packs/day    Types: Cigarettes  . Smokeless tobacco: Never Used  . Alcohol Use: Not on file  . Drug Use: Not on file  . Sexually Active: Not on file   Other Topics Concern  . Not on file   Social History Narrative  . No narrative on file    Family History:  The patient has diabetes in first-degree relative and   mother died of MI in Apr 12, 2003.  Siblings with hypertension.  No family   history of cyclic vomiting or migraine or colon cancer.  Allergies: Allergies  Allergen Reactions  . Erythromycin Nausea And Vomiting  . Oxycodone-Acetaminophen Itching    Current Outpatient Prescriptions  Medication Sig Dispense Refill  . amLODipine (NORVASC) 10 MG tablet Take 10 mg by mouth daily.        Marland Kitchen aspirin 81 MG tablet Take 81 mg by mouth daily.        . carvedilol (COREG) 25 MG tablet Take 25 mg by mouth 2 (two) times daily with meals.        . cyclobenzaprine (FLEXERIL) 5 MG tablet Take 2  tablets (10 mg total) by mouth 3 (three) times daily as needed.  21 tablet  4  . docusate sodium (COLACE) 100 MG capsule Take 100 mg by mouth 2 (two) times daily as needed.        Marland Kitchen EPINEPHrine (EPIPEN) 0.3 MG/0.3ML DEVI One dose Im as needed for severe anaphylaxis.       Marland Kitchen HYDROcodone-acetaminophen (VICODIN) 5-500 MG per tablet Take 1-2 tablets by mouth every 6 (six) hours.  30 tablet  2  . insulin aspart (NOVOLOG) 100 UNIT/ML injection Inject 6 Units into the skin 3 (three) times daily before meals.        . insulin glargine (LANTUS) 100 UNIT/ML injection Inject 30 Units into the skin every morning. Take 22 Units each morning. Increase by 1 Unit a day for each fasting blood glucose over 110.      Marland Kitchen LORazepam (ATIVAN) 1 MG tablet Take  1 mg by mouth every 6 (six) hours as needed. For nausea.       . metFORMIN (GLUCOPHAGE) 1000 MG tablet       . mirtazapine (REMERON) 30 MG tablet Take 30 mg by mouth at bedtime.        Marland Kitchen omeprazole (PRILOSEC) 20 MG capsule Take 20 mg by mouth daily.        . polyethylene glycol (MIRALAX) powder Mix packet with water once daily as needed for constipation.       . promethazine (PHENERGAN) 25 MG suppository Place 25 mg rectally every 6 (six) hours as needed.        . promethazine (PHENERGAN) 25 MG tablet Take 25 mg by mouth every 4 (four) hours. At first sign of nausea.        Marland Kitchen PROMETHAZINE HCL IM Inject 25 mg (37mL) IM every 6 hours as needed.        Review Of Systems: Per HPI with the following additions: Otherwise 12 point review of systems was performed and was unremarkable.  Physical Exam:               VITALS:  Blood pressure 195/82, pulse 95,  respirations 16, O2 sats 100% on room air, afebrile.   GENERAL APPEARANCE:  Appears uncomfortable, is cooperative with exam.   HEENT:  PERRLA.  EOMI.  Mucous membranes moist.   CARDIOVASCULAR:  1/6 systolic ejection murmur heard at left lower  sternal border with regular rate and rhythm and no edema, no JVD.   LUNGS:  Clear to auscultation bilaterally.  No wheezes or rales.  Breathing unlabored.   ABDOMEN:  No CVA tenderness.  Bowel sounds are diminished, diffusely  tender without rebound tenderness.   EXTREMITIES:  No lower extremity edema or skin breakdown.   SKIN:  No rashes.   NEUROLOGY:  Alert and oriented x3.  Cranial nerves II through XII  grossly intact.  No gross deficits in motor or sensory functions.   Labs and Imaging: Lab Results  Component Value Date/Time   NA 137 03/22/2011 11:40 AM   K 3.8 03/22/2011 11:40 AM   CL 98 03/22/2011 11:40 AM   CO2 24 03/22/2011 11:40 AM   BUN 25* 03/22/2011 11:40 AM   CREATININE 1.73* 03/22/2011 11:40 AM   GLUCOSE 259* 03/22/2011 11:40 AM   Lab Results  Component Value Date   WBC 9.1 03/22/2011    HGB 13.6 03/22/2011   HCT 38.3 03/22/2011   MCV 91.4 03/22/2011   PLT 248 03/22/2011     Assessment and Plan: 1. Vomiting.  Presentation appears consistent with recurrence of cyclic vomiting.  The patient is unable to identify trigger.  She   denies current marijuana abuse and no other medication exposures.     In the emergency department, was administered Zofran, dilaudid, ativan  with no clinical improvement, and the patient is still actively vomiting, therefore we will try Phenergan as this seems to help her the most in the past.  We will administer this IV.  Also consider addition of Reglan; however, medication is on national back order and not available currently.  Associated abdominal pain seems to be relieved with Dilaudid, therefore we will continue this as well.  We will keep the patient n.p.o. and advance diet as toleratedlikely no sooner than tomorrow a.m.  The patient is not clinically  dehydrated at this time, but will start maintenance IV fluids.   2. Diabetes mellitus.  Is hyperglycemic likely due to noncompliance with stable insulin dosing.  Has a mild anion gap of 15 notably,   which may also be contributing to her nausea and vomiting.  We will start on partial dose of Lantus at 5 units daily for now and institute moderate sliding scale.  Plan to restart home dose of Lantus once the patient is tolerating oral intake.   3. Hypertension.  The patient cannot take home antihypertensives due  to vomiting, therefore, we will start metoprolol 5 mg IV q.8 h.  p.r.n. for systolic blood pressures greater than 180.   4. Acute renal failure.  The patient's creatinine is mildly elevated at 1.73 with last known values from hospital admission being 1.6.  This may be an increase over her baseline and we will monitor with IV fluid hydration.   5. FEN.  The patient will be n.p.o. for cyclic vomiting and may advance diet as tolerated.  We will administer 1 L IV bolus with normal saline and start 125 mL/hour  thereafter.  Electrolytes currently normal.  We will follow up with BMET in the a.m.   6. Prophylaxis.  We will start SCDs for DVT prophylaxis and PPI daily.   7. Disposition pending clinical improvement.

## 2011-03-23 ENCOUNTER — Encounter: Payer: Medicare Other | Admitting: Physical Therapy

## 2011-03-23 DIAGNOSIS — R109 Unspecified abdominal pain: Secondary | ICD-10-CM

## 2011-03-23 DIAGNOSIS — I10 Essential (primary) hypertension: Secondary | ICD-10-CM

## 2011-03-23 DIAGNOSIS — R1110 Vomiting, unspecified: Secondary | ICD-10-CM

## 2011-03-23 LAB — GLUCOSE, CAPILLARY
Glucose-Capillary: 251 mg/dL — ABNORMAL HIGH (ref 70–99)
Glucose-Capillary: 86 mg/dL (ref 70–99)
Glucose-Capillary: 75 mg/dL (ref 70–99)
Glucose-Capillary: 70 mg/dL (ref 70–99)

## 2011-03-23 LAB — CBC
Hemoglobin: 11.2 g/dL — ABNORMAL LOW (ref 12.0–15.0)
Platelets: 225 10*3/uL (ref 150–400)
RBC: 3.53 MIL/uL — ABNORMAL LOW (ref 3.87–5.11)
WBC: 8.6 10*3/uL (ref 4.0–10.5)

## 2011-03-23 LAB — BASIC METABOLIC PANEL
CO2: 27 mEq/L (ref 19–32)
Chloride: 103 mEq/L (ref 96–112)
GFR calc Af Amer: 41 mL/min — ABNORMAL LOW (ref >60)
Potassium: 3.5 mEq/L (ref 3.5–5.1)

## 2011-03-24 LAB — BASIC METABOLIC PANEL
BUN: 16 mg/dL (ref 6–23)
BUN: 10 mg/dL (ref 6–23)
CO2: 23 mEq/L (ref 19–32)
Calcium: 7.5 mg/dL — ABNORMAL LOW (ref 8.4–10.5)
GFR calc Af Amer: 38 mL/min — ABNORMAL LOW (ref >60)
GFR calc non Af Amer: 31 mL/min — ABNORMAL LOW (ref >60)
Glucose, Bld: 191 mg/dL — ABNORMAL HIGH (ref 70–99)
Potassium: 3.2 mEq/L — ABNORMAL LOW (ref 3.5–5.1)
Potassium: 4 mEq/L (ref 3.5–5.1)
Sodium: 136 mEq/L (ref 135–145)
Sodium: 132 mEq/L — ABNORMAL LOW (ref 135–145)

## 2011-03-24 LAB — CBC
MCV: 94 fL (ref 78.0–100.0)
Platelets: 186 10*3/uL (ref 150–400)
RDW: 14 % (ref 11.5–15.5)
WBC: 6.2 10*3/uL (ref 4.0–10.5)

## 2011-03-24 LAB — GLUCOSE, CAPILLARY
Glucose-Capillary: 143 mg/dL — ABNORMAL HIGH (ref 70–99)
Glucose-Capillary: 148 mg/dL — ABNORMAL HIGH (ref 70–99)
Glucose-Capillary: 191 mg/dL — ABNORMAL HIGH (ref 70–99)

## 2011-03-25 ENCOUNTER — Encounter: Payer: Medicare Other | Admitting: Physical Therapy

## 2011-03-25 LAB — BASIC METABOLIC PANEL
CO2: 22 mEq/L (ref 19–32)
Glucose, Bld: 158 mg/dL — ABNORMAL HIGH (ref 70–99)
Potassium: 4.7 mEq/L (ref 3.5–5.1)
Sodium: 136 mEq/L (ref 135–145)

## 2011-03-25 LAB — CBC
HCT: 29.2 % — ABNORMAL LOW (ref 36.0–46.0)
Hemoglobin: 10 g/dL — ABNORMAL LOW (ref 12.0–15.0)
WBC: 4.9 10*3/uL (ref 4.0–10.5)

## 2011-03-25 LAB — GLUCOSE, CAPILLARY
Glucose-Capillary: 119 mg/dL — ABNORMAL HIGH (ref 70–99)
Glucose-Capillary: 106 mg/dL — ABNORMAL HIGH (ref 70–99)
Glucose-Capillary: 146 mg/dL — ABNORMAL HIGH (ref 70–99)

## 2011-03-26 LAB — BASIC METABOLIC PANEL
BUN: 7 mg/dL (ref 6–23)
CO2: 23 mEq/L (ref 19–32)
Chloride: 107 mEq/L (ref 96–112)
Creatinine, Ser: 1.5 mg/dL — ABNORMAL HIGH (ref 0.4–1.2)
Potassium: 4.4 mEq/L (ref 3.5–5.1)

## 2011-03-26 LAB — CBC
HCT: 27.2 % — ABNORMAL LOW (ref 36.0–46.0)
Hemoglobin: 9.1 g/dL — ABNORMAL LOW (ref 12.0–15.0)
MCH: 31.6 pg (ref 26.0–34.0)
MCV: 94.4 fL (ref 78.0–100.0)
Platelets: 179 10*3/uL (ref 150–400)
RBC: 2.88 MIL/uL — ABNORMAL LOW (ref 3.87–5.11)
WBC: 4.3 10*3/uL (ref 4.0–10.5)

## 2011-03-26 LAB — GLUCOSE, CAPILLARY: Glucose-Capillary: 119 mg/dL — ABNORMAL HIGH (ref 70–99)

## 2011-03-30 NOTE — H&P (Signed)
NAME:  [name] [name], CHLEBOWSKI               ACCOUNT NO.:  [id number]  MEDICAL RECORD NO.:  [id number]           PATIENT TYPE:  I  LOCATION:  6708                         FACILITY:  [facility]  PHYSICIAN:  Talbert Cage, M.D.DATE OF BIRTH:  04/27/1966  DATE OF ADMISSION:  02/26/2011 DATE OF DISCHARGE:                               HISTORY & PHYSICAL   PRIMARY CARE PHYSICIAN:  Mariana Arn, MD  CHIEF COMPLAINT:  Vomiting and hyperglycemia.  HISTORY OF PRESENT ILLNESS:  This is a 45 year old female with a history of cyclic vomiting and uncontrolled diabetes mellitus who presented to the ED today with recurrent intractable emesis.  Her symptoms began yesterday evening with her normal vomiting episodes and she was able to inject herself with home Phenergan injections, which seemed to control her symptoms initially and she was able to fall asleep last night without severe problem.  Unfortunately [name] awoke this morning again with intractable vomiting and was unable to hold down any food, liquids, or any of her multiple medications.  Notably, the patient has not been able to afford many of her medications and has not been taking her insulin due to the fact that she is not eating.  She cannot afford her Reglan.  She denies any changes to her diet or knowledge of any triggers for this episode, however, is known to have vomiting triggered by marijuana abuse in the past.  REVIEW OF SYSTEMS:  This patient denies fevers, diarrhea, hematochezia, hematemesis, dysuria, severe headache, or any new exposures or medications.  The patient endorses mild transient headache and some chills yesterday.  PAST MEDICAL HISTORY: 1. Diabetes mellitus type 2. 2. Anemia. 3. Diabetic neuropathy. 4. Major depression. 5. Anxiety. 6. Tobacco abuse. 7. History of migraines. 8. Benign systemic hypertension. 9. GERD. 0000000 cyclic vomiting. 11.History of pancreatitis. 12.Chronic back  pain. 13.Insomnia. 14.Has had over 20 admissions for cyclic vomiting in the past with a     normal gastric emptying study in 14-Apr-2007.  PAST SURGICAL HISTORY: 1. Cholecystectomy in 2001-04-13. 2. Total abdominal hysterectomy and right salpingo-oophorectomy for     tubo-ovarian abscess. 3. Adhesion lysis or small bowel obstruction in Apr 13, 2002. 4. Central venous port placement in 04/13/05.  SOCIAL HISTORY:  Positive for marijuana abuse, tobacco abuse.  Denies current alcohol use.  FAMILY HISTORY:  The patient has diabetes in first-degree relative and mother died of MI in 14-Apr-2003.  Siblings with hypertension.  No family history of cyclic vomiting or migraine or colon cancer.  ALLERGIES: 1. ERYTHROMYCIN causes nausea and vomiting. 2. PERCOCET causes itching.  HOME MEDICATIONS: 1. Aspirin 81 mg daily. 2. Coreg 25 mg p.o. b.i.d. 3. Flexeril 5 mg p.o. 2 tablets t.i.d. p.r.n. 4. Colace 100 mg b.i.d. 5. EpiPen 0.3 mg p.r.n. 6. Vicodin 5/500 tablets 1-2 tabs p.o. q.6 h. 7. Lantus 22 units q.a.m. 8. Ativan 1 mg p.o. q.6 h. p.r.n. 9. Remeron 30 mg p.o. at bedtime. 10.Prilosec 20 mg p.o. daily. 11.MiraLax p.r.n. 12.Phenergan 20 mg suppository q.6 h. p.r.n. 13.Phenergan 25 mg p.o. q.4 h. p.r.n. 14.Phenergan 25 mg IM q.6 h. p.r.n. 15.Tramadol 50 mg p.o. q.6 h. p.r.n.  PHYSICAL  EXAMINATION:  VITALS:  Blood pressure 155/82, pulse 95, respirations 16, O2 sats 100% on room air, afebrile. GENERAL APPEARANCE:  Appears uncomfortable, is cooperative with exam. HEENT:  PERRLA.  EOMI.  Mucous membranes moist. CARDIOVASCULAR:  1/6 systolic ejection murmur heard at left lower sternal border with regular rate and rhythm and no edema, no JVD. LUNGS:  Clear to auscultation bilaterally.  No wheezes or rales. Breathing unlabored. ABDOMEN:  No CVA tenderness.  Bowel sounds are diminished, diffusely tender without rebound tenderness. EXTREMITIES:  No lower extremity edema or skin breakdown. SKIN:  No rashes. NEUROLOGY:   Alert and oriented x3.  Cranial nerves II through XII grossly intact.  No gross deficits in motor or sensory functions.  LABORATORY DATA: 1. Sodium 137, potassium 3.7, chloride 99, CO2 of 25, BUN 20,     creatinine 1.71, glucose 411. 2. CBC; white blood count 9.5, hemoglobin 11.8, hematocrit 33.5, MCV     90.3, platelets 243. 3. Lipase 29. 4. Urinalysis shows greater than 1000 glucose, 15 ketones, 100     protein, small blood, and 3-6 RBCs. 5. CBG 398.  ASSESSMENT/PLAN:  This is a 45 year old female with history of cyclic vomiting and chronic abdominal pain who presents again with intractable vomiting and hyperglycemia. 1. Vomiting.  Presentation appears consistent with recurrence of     cyclic vomiting. Abdominal x-ray is negative. The patient is unable to identify trigger.  She     denies current marijuana abuse and no other medication exposures.     In the emergency department, was administered Zofran with no     clinical improvement, and the patient is still actively vomiting,     therefore we will try IV Phenergan as this seems to help her the most     in the past.  Also consider addition     of Reglan; however, medication is on national back order and not     available currently.  Associated abdominal pain seems to be     relieved with Dilaudid, therefore we will continue this as well.     We will keep the patient n.p.o. and advance diet as tolerated     likely no sooner than tomorrow a.m.  The patient is not clinically     dehydrated at this time, but will start maintenance IV fluids. 2. Diabetes mellitus.  Is hyperglycemic likely due to noncompliance     with basal insulin dosing.  Has a mild anion gap of 13,     which may also be contributing to her nausea and vomiting.  We will     start on partial dose of Lantus at 5 units daily for now and     institute moderate sliding scale.  Plan to restart home dose of     Lantus once the patient is tolerating oral intake. 3.  Hypertension.  The patient cannot take home antihypertensives due     to vomiting, therefore, we will start metoprolol 5 mg IV q.8 h.     p.r.n. for systolic blood pressures greater than 170. 4. Acute renal failure.  The patient's creatinine is mildly elevated     at 1.71 with last known values from hospital admission being 1.6.     This may be an increase over her baseline and we will monitor with     IV fluid hydration. 5. FEN.  The patient will be n.p.o. for cyclic vomiting and may     advance diet as tolerated.  We will administer 1  L NS bolus     and start 125 mL/hour thereafter.  Electrolytes     currently normal.  We will follow up with BMET in the a.m. 6. Prophylaxis.  We will start SCDs for DVT prophylaxis and PPI daily. 7. Disposition pending clinical improvement.    ______________________________ Luis Abed, MD   ______________________________ Talbert Cage, M.D.    JK/MEDQ  D:  02/26/2011  T:  02/27/2011  Job:  [id number]  Electronically Signed by Luis Abed MD on 03/07/2011 10:19:48 PM Electronically Signed by Talbert Cage M.D. on 03/30/2011 03:41:45 PM

## 2011-03-31 LAB — COMPREHENSIVE METABOLIC PANEL
ALT: 13 U/L (ref 0–35)
ALT: 14 U/L (ref 0–35)
AST: 22 U/L (ref 0–37)
AST: 23 U/L (ref 0–37)
CO2: 24 mEq/L (ref 19–32)
CO2: 27 mEq/L (ref 19–32)
Chloride: 100 mEq/L (ref 96–112)
Chloride: 102 mEq/L (ref 96–112)
Creatinine, Ser: 1.23 mg/dL — ABNORMAL HIGH (ref 0.4–1.2)
Creatinine, Ser: 1.44 mg/dL — ABNORMAL HIGH (ref 0.4–1.2)
GFR calc Af Amer: 48 mL/min — ABNORMAL LOW (ref >60)
GFR calc Af Amer: 57 mL/min — ABNORMAL LOW (ref >60)
GFR calc non Af Amer: 40 mL/min — ABNORMAL LOW (ref >60)
GFR calc non Af Amer: 47 mL/min — ABNORMAL LOW (ref >60)
Glucose, Bld: 224 mg/dL — ABNORMAL HIGH (ref 70–99)
Sodium: 137 mEq/L (ref 135–145)
Total Bilirubin: 0.5 mg/dL (ref 0.3–1.2)
Total Bilirubin: 1.1 mg/dL (ref 0.3–1.2)

## 2011-03-31 LAB — GLUCOSE, CAPILLARY
Glucose-Capillary: 111 mg/dL — ABNORMAL HIGH (ref 70–99)
Glucose-Capillary: 114 mg/dL — ABNORMAL HIGH (ref 70–99)
Glucose-Capillary: 130 mg/dL — ABNORMAL HIGH (ref 70–99)
Glucose-Capillary: 194 mg/dL — ABNORMAL HIGH (ref 70–99)
Glucose-Capillary: 84 mg/dL (ref 70–99)

## 2011-03-31 LAB — CBC
HCT: 30.4 % — ABNORMAL LOW (ref 36.0–46.0)
Hemoglobin: 10.5 g/dL — ABNORMAL LOW (ref 12.0–15.0)
Hemoglobin: 11.3 g/dL — ABNORMAL LOW (ref 12.0–15.0)
Hemoglobin: 12.5 g/dL (ref 12.0–15.0)
MCHC: 34.5 g/dL (ref 30.0–36.0)
MCHC: 34.5 g/dL (ref 30.0–36.0)
MCV: 100 fL (ref 78.0–100.0)
MCV: 100.4 fL — ABNORMAL HIGH (ref 78.0–100.0)
MCV: 99.4 fL (ref 78.0–100.0)
RBC: 3.04 MIL/uL — ABNORMAL LOW (ref 3.87–5.11)
RBC: 3.26 MIL/uL — ABNORMAL LOW (ref 3.87–5.11)
RBC: 3.65 MIL/uL — ABNORMAL LOW (ref 3.87–5.11)
RDW: 13.3 % (ref 11.5–15.5)
WBC: 10.7 10*3/uL — ABNORMAL HIGH (ref 4.0–10.5)
WBC: 6.8 10*3/uL (ref 4.0–10.5)

## 2011-03-31 LAB — URINALYSIS, ROUTINE W REFLEX MICROSCOPIC
Glucose, UA: >1000 mg/dL — AB
Leukocytes, UA: NEGATIVE
Specific Gravity, Urine: 1.021 (ref 1.005–1.030)
pH: 6 (ref 5.0–8.0)

## 2011-03-31 LAB — BASIC METABOLIC PANEL
CO2: 25 mEq/L (ref 19–32)
Chloride: 106 mEq/L (ref 96–112)
Glucose, Bld: 116 mg/dL — ABNORMAL HIGH (ref 70–99)
Potassium: 4.2 mEq/L (ref 3.5–5.1)

## 2011-03-31 LAB — DIFFERENTIAL
Basophils Absolute: 0 10*3/uL (ref 0.0–0.1)
Eosinophils Absolute: 0 10*3/uL (ref 0.0–0.7)
Eosinophils Relative: 0 % (ref 0–5)
Neutrophils Relative %: 88 % — ABNORMAL HIGH (ref 43–77)

## 2011-03-31 LAB — URINE MICROSCOPIC-ADD ON

## 2011-03-31 LAB — LIPASE, BLOOD: Lipase: 24 U/L (ref 11–59)

## 2011-03-31 LAB — URINE CULTURE

## 2011-04-04 LAB — GLUCOSE, CAPILLARY
Glucose-Capillary: 101 mg/dL — ABNORMAL HIGH (ref 70–99)
Glucose-Capillary: 106 mg/dL — ABNORMAL HIGH (ref 70–99)
Glucose-Capillary: 106 mg/dL — ABNORMAL HIGH (ref 70–99)
Glucose-Capillary: 123 mg/dL — ABNORMAL HIGH (ref 70–99)
Glucose-Capillary: 139 mg/dL — ABNORMAL HIGH (ref 70–99)
Glucose-Capillary: 141 mg/dL — ABNORMAL HIGH (ref 70–99)
Glucose-Capillary: 155 mg/dL — ABNORMAL HIGH (ref 70–99)
Glucose-Capillary: 189 mg/dL — ABNORMAL HIGH (ref 70–99)
Glucose-Capillary: 75 mg/dL (ref 70–99)
Glucose-Capillary: 93 mg/dL (ref 70–99)

## 2011-04-04 LAB — DIFFERENTIAL
Basophils Absolute: 0 10*3/uL (ref 0.0–0.1)
Eosinophils Relative: 0 % (ref 0–5)
Lymphocytes Relative: 18 % (ref 12–46)
Lymphs Abs: 2.6 10*3/uL (ref 0.7–4.0)
Monocytes Absolute: 1.3 10*3/uL — ABNORMAL HIGH (ref 0.1–1.0)
Monocytes Relative: 9 % (ref 3–12)

## 2011-04-04 LAB — POCT CARDIAC MARKERS
CKMB, poc: 1.3 ng/mL (ref 1.0–8.0)
Troponin i, poc: <0.05 ng/mL (ref 0.00–0.09)

## 2011-04-04 LAB — CBC
MCV: 96.7 fL (ref 78.0–100.0)
Platelets: 191 10*3/uL (ref 150–400)
Platelets: 248 10*3/uL (ref 150–400)
RBC: 3.63 MIL/uL — ABNORMAL LOW (ref 3.87–5.11)
WBC: 14.2 10*3/uL — ABNORMAL HIGH (ref 4.0–10.5)
WBC: 9.6 10*3/uL (ref 4.0–10.5)

## 2011-04-04 LAB — COMPREHENSIVE METABOLIC PANEL
AST: 18 U/L (ref 0–37)
Albumin: 4.2 g/dL (ref 3.5–5.2)
Chloride: 95 mEq/L — ABNORMAL LOW (ref 96–112)
Creatinine, Ser: 2.74 mg/dL — ABNORMAL HIGH (ref 0.4–1.2)
GFR calc Af Amer: 23 mL/min — ABNORMAL LOW (ref >60)
Total Bilirubin: 1 mg/dL (ref 0.3–1.2)
Total Protein: 7.9 g/dL (ref 6.0–8.3)

## 2011-04-04 LAB — BASIC METABOLIC PANEL
BUN: 21 mg/dL (ref 6–23)
BUN: 46 mg/dL — ABNORMAL HIGH (ref 6–23)
BUN: 9 mg/dL (ref 6–23)
CO2: 26 mEq/L (ref 19–32)
Calcium: 7.7 mg/dL — ABNORMAL LOW (ref 8.4–10.5)
Calcium: 8 mg/dL — ABNORMAL LOW (ref 8.4–10.5)
Chloride: 106 mEq/L (ref 96–112)
Chloride: 109 mEq/L (ref 96–112)
Creatinine, Ser: 1.42 mg/dL — ABNORMAL HIGH (ref 0.4–1.2)
Creatinine, Ser: 2.17 mg/dL — ABNORMAL HIGH (ref 0.4–1.2)
GFR calc Af Amer: 30 mL/min — ABNORMAL LOW (ref >60)
GFR calc Af Amer: 49 mL/min — ABNORMAL LOW (ref >60)
GFR calc non Af Amer: 25 mL/min — ABNORMAL LOW (ref >60)
Potassium: 3.7 mEq/L (ref 3.5–5.1)
Sodium: 136 mEq/L (ref 135–145)

## 2011-04-04 LAB — URINALYSIS, ROUTINE W REFLEX MICROSCOPIC
Glucose, UA: NEGATIVE mg/dL
Ketones, ur: 15 mg/dL — AB
Protein, ur: 100 mg/dL — AB

## 2011-04-04 LAB — POCT PREGNANCY, URINE: Preg Test, Ur: NEGATIVE

## 2011-04-04 LAB — URINE MICROSCOPIC-ADD ON

## 2011-04-06 NOTE — Discharge Summary (Signed)
NAME:  [name] [name], [name] [name]               ACCOUNT NO.:  [id number]  MEDICAL RECORD NO.:  [id number]           PATIENT TYPE:  I  LOCATION:  [id number]                         FACILITY:  [facility]:  [name] [name], MDDATE OF BIRTH:  1966/06/27  DATE OF ADMISSION:  03/14/2011 DATE OF DISCHARGE:  03/18/2011                              DISCHARGE SUMMARY   DISCHARGE DISPOSITION:  Home.  FINAL DISCHARGE DIAGNOSES: 1. Cyclical vomiting, resolved. 2. Diabetes type 2, poorly controlled - hemoglobin A1c 11.5. 3. Dehydration, resolved. 4. Chronic anemia. 5. History of migraines. 6. Hypertension control. 7. Peripheral neuropathy. 8. History of ulcerative esophagitis. 9. Migraine headaches. 10.History of pancreatitis. 11.Chronic low back pain. 12.Gastroesophageal reflux disease. 13.Depression/anxiety.  Discharge medications include the following: 1. Clonidine 0.2 mg for 24 hours transdermal weekly. 2. Reglan 10 mg p.o. a.c. and h.s. 3. Zofran ODT 4 mg p.o. q.4 h p.r.n. nausea. 4. Aspirin enteric-coated 81 mg p.o. daily. 5. Ativan 1 mg p.o. q.6 h. p.r.n. nausea and vomiting 6. Coreg 25 mg p.o. b.i.d. 7. Benadryl 25 mg p.o. q.6 h. p.r.n. nausea. 8. Flexeril 5 mg p.o. t.i.d. 9. Lantus 22 units subcu h.s. 10.Mirtazapine 30 mg p.o. nightly. 11.NovoLog FlexPen 6 units subcutaneously t.i.d. 12.Percocet 5/325 one tablet p.o. q.6 h. p.r.n. pain. 13.Phenergan 25 mg p.o. q.4 h. p.r.n. nausea. 14.Prilosec 20 mg p.o. daily. 15.MiraLax 17 g in 8 ounces of water daily as needed for constipation.  CONSULTANTS:  None.  PROCEDURES:  None.  DIAGNOSTIC STUDIES:  Acute abdominal series x-rays, which shows no evidence of active pulmonary disease.  There is a nonobstructive bowel gas pattern.  PRIMARY CARE PHYSICIAN:  Dr. Mariana Arn with [facility].  CODE STATUS:  Full code.  ALLERGIES: 1. ERYTHROMYCIN. 2. TYLOX.  CHIEF COMPLAINT:  Vomiting from 2  days.  HISTORY OF PRESENT ILLNESS:  This is a 45 year old female with a history of diabetes type 2 and cyclical vomiting who presents to the emergency room with intractable nausea and vomiting.  The patient has been taking Phenergan and Ativan at home; however, she stated when she started vomiting, she was unable to keep any of the medications down.  She attempted to give herself Phenergan injection, however, this was ineffective.  Please refer to the complete dictated H and P by Dr. Rockne Menghini for details of the HPI.  HOSPITAL COURSE: 1. Cyclical vomiting.  The patient had a history of cyclical vomiting     and I wonder if there is a component of diabetic gastroparesis     given her poorly controlled diabetes.  Nevertheless, the patient     was given bowel rest, she was given IV fluids, and then started on     clear diet, which was advanced as tolerated to a heart-healthy     diabetic diet.  The patient at this point is tolerating her diet     without difficulty.  The patient states that she had a gastric     emptying study done approximately 2 years ago and was found not to     have diabetic gastroparesis at that time.  However,  it may be     prudent to be reevaluate this patient with another gastric emptying     study as an outpatient when her blood sugars are better controlled     and she is tolerating diet better.  The patient is being sent home     on the above medications and to follow up with primary care     physician at [facility]. 2. Diabetes type 2.  The patient's diabetes is very poorly controlled     as reflected on a hemoglobin of 11.5.  The patient expressed some     concerns about her diabetic management.  She is actually requesting     a possible recommendation to see an endocrinologist.  I will defer     to her primary care physician to make that referral and     recommendation.  Additionally, the patient also states that she has      requested a referral to podiatry due to calluses on the bottom of     her feet.  The patient states that she work and is required stand     on her feet daily.  The patient actually has attempted to shave the     calluses herself due to pain associated with the calluses.  I have     advised the patient against doing that.  The patient has been given     the name and phone numbers of 2 podiatry practices in the area, one     is [facility], phone number is [phone] and the other is     [facility], phone number [phone].  I have also provided the     patient with the preventative guidelines for diabetes, so that she     can be more educated and empowered and involved in her care.  I     would recommend, however, that this patient be made an urgent     referral to podiatry as she does have visible calluses on her feet     and these likely need to be attended to some degree of urgency.     Microfilament test was done on the patient and she was found to     have some small areas of loss of sensation in the feet.  It was     less than 30% loss of sensation in the feet. 3. Dehydration.  Of note, the patient was given IV fluids for     rehydration and has been able to maintain her hydration thus far     without any artificial means. 4. History of migraines.  The patient is continued on her usual     medications. 5. Anemia.  Hemoglobin has been stable throughout this hospital stay.  Otherwise, her other chronic medical problems have remained stable.  CONDITION AT THE TIME OF DISCHARGE:  Stable.  Physical examination is as follows. GENERAL:  The patient is well appearing in no acute distress. VITAL SIGNS:  Temperature is 97.9, heart rate 67, blood pressure 157/89, respiratory rate 16, O2 sats 99% on room air. HEENT:  She is normocephalic, atraumatic.  Pupils are equally round and reactive to light and accommodation.  Extraocular movements are intact. Oropharynx is moist.  No  exudate, erythema, or lesions are noted. NECK:  Trachea is midline.  No masses.  No thyromegaly.  No JVD.  No carotid bruit. ABDOMEN:  Obese, soft, nontender, nondistended.  No masses.  No hepatosplenomegaly noted. LYMPH NODE:  No cervical, axillary, inguinal lymphadenopathy. MUSCULOSKELETAL:  The patient has no warmth, swelling, or erythema around the joints and no spinal tenderness noted. NEUROLOGICAL:  The patient has no focal neurological deficits.  Nerves II through XII are grossly intact.  Microfilament test shows decreased sensation less than 30% of the bilateral feet.  DIETARY RESTRICTIONS:  The patient should be on diabetic heart-healthy diet.  PHYSICAL RESTRICTIONS:  Activity as tolerated.  FOLLOWUP:  The patient is to follow up with her primary care physician within a week.  The patient has been given the information to call and make an appointment for podiatry, but I would ask her primary care physician to follow up on a podiatry appointment for this patient.  Total time to coordinate this patient's discharge examination including face-to-face time 32 minutes.     [name] Gamer, MD     MAM/MEDQ  D:  03/18/2011  T:  03/19/2011  Job:  [id number]  cc:   Jacquelynn Cree, M.D.  Dr. Mariana Arn  Electronically Signed by Liston Alba MD on 04/06/2011 08:25:16 PM

## 2011-04-07 ENCOUNTER — Ambulatory Visit (INDEPENDENT_AMBULATORY_CARE_PROVIDER_SITE_OTHER): Payer: Medicare Other | Admitting: Family Medicine

## 2011-04-07 ENCOUNTER — Encounter: Payer: Self-pay | Admitting: Family Medicine

## 2011-04-07 VITALS — BP 164/82 | HR 82 | Temp 97.9°F | Wt 178.9 lb

## 2011-04-07 DIAGNOSIS — R1115 Cyclical vomiting syndrome unrelated to migraine: Secondary | ICD-10-CM

## 2011-04-07 LAB — DIFFERENTIAL
Basophils Absolute: 0 10*3/uL (ref 0.0–0.1)
Basophils Absolute: 0.1 10*3/uL (ref 0.0–0.1)
Basophils Relative: 0 % (ref 0–1)
Basophils Relative: 0 % (ref 0–1)
Eosinophils Absolute: 0.1 10*3/uL (ref 0.0–0.7)
Eosinophils Absolute: 0 10*3/uL (ref 0.0–0.7)
Eosinophils Relative: 1 % (ref 0–5)
Eosinophils Relative: 0 % (ref 0–5)
Eosinophils Relative: 2 % (ref 0–5)
Lymphocytes Relative: 31 % (ref 12–46)
Monocytes Absolute: 1.3 10*3/uL — ABNORMAL HIGH (ref 0.1–1.0)
Monocytes Absolute: 0.6 10*3/uL (ref 0.1–1.0)
Monocytes Relative: 7 % (ref 3–12)
Neutro Abs: 4.9 10*3/uL (ref 1.7–7.7)
Neutrophils Relative %: 62 % (ref 43–77)

## 2011-04-07 LAB — URINE MICROSCOPIC-ADD ON

## 2011-04-07 LAB — CBC
HCT: 28.3 % — ABNORMAL LOW (ref 36.0–46.0)
HCT: 29.7 % — ABNORMAL LOW (ref 36.0–46.0)
HCT: 38 % (ref 36.0–46.0)
Hemoglobin: 12 g/dL (ref 12.0–15.0)
Hemoglobin: 9.1 g/dL — ABNORMAL LOW (ref 12.0–15.0)
MCHC: 34.1 g/dL (ref 30.0–36.0)
MCHC: 33.5 g/dL (ref 30.0–36.0)
MCHC: 34.3 g/dL (ref 30.0–36.0)
MCV: 95.7 fL (ref 78.0–100.0)
MCV: 97.4 fL (ref 78.0–100.0)
Platelets: 163 10*3/uL (ref 150–400)
Platelets: 176 10*3/uL (ref 150–400)
Platelets: 177 10*3/uL (ref 150–400)
Platelets: 277 10*3/uL (ref 150–400)
Platelets: 242 10*3/uL (ref 150–400)
RBC: 3.64 MIL/uL — ABNORMAL LOW (ref 3.87–5.11)
RBC: 3.97 MIL/uL (ref 3.87–5.11)
RDW: 14.1 % (ref 11.5–15.5)
RDW: 13.9 % (ref 11.5–15.5)
WBC: 7 10*3/uL (ref 4.0–10.5)
WBC: 13.4 10*3/uL — ABNORMAL HIGH (ref 4.0–10.5)
WBC: 5.1 10*3/uL (ref 4.0–10.5)
WBC: 7.7 10*3/uL (ref 4.0–10.5)
WBC: 8.2 10*3/uL (ref 4.0–10.5)

## 2011-04-07 LAB — COMPREHENSIVE METABOLIC PANEL
ALT: 13 U/L (ref 0–35)
ALT: 14 U/L (ref 0–35)
ALT: 8 U/L (ref 0–35)
AST: 19 U/L (ref 0–37)
AST: 20 U/L (ref 0–37)
AST: 14 U/L (ref 0–37)
Albumin: 2.7 g/dL — ABNORMAL LOW (ref 3.5–5.2)
Albumin: 2.9 g/dL — ABNORMAL LOW (ref 3.5–5.2)
Albumin: 4.5 g/dL (ref 3.5–5.2)
Albumin: 4 g/dL (ref 3.5–5.2)
Alkaline Phosphatase: 74 U/L (ref 39–117)
Alkaline Phosphatase: 45 U/L (ref 39–117)
Alkaline Phosphatase: 94 U/L (ref 39–117)
Alkaline Phosphatase: 67 U/L (ref 39–117)
BUN: 14 mg/dL (ref 6–23)
BUN: 7 mg/dL (ref 6–23)
BUN: 28 mg/dL — ABNORMAL HIGH (ref 6–23)
CO2: 24 mEq/L (ref 19–32)
CO2: 25 mEq/L (ref 19–32)
CO2: 27 mEq/L (ref 19–32)
Calcium: 7.7 mg/dL — ABNORMAL LOW (ref 8.4–10.5)
Chloride: 104 mEq/L (ref 96–112)
Chloride: 105 mEq/L (ref 96–112)
Chloride: 107 mEq/L (ref 96–112)
Chloride: 97 mEq/L (ref 96–112)
Chloride: 100 mEq/L (ref 96–112)
Creatinine, Ser: 1.29 mg/dL — ABNORMAL HIGH (ref 0.4–1.2)
Creatinine, Ser: 1.84 mg/dL — ABNORMAL HIGH (ref 0.4–1.2)
Creatinine, Ser: 1.81 mg/dL — ABNORMAL HIGH (ref 0.4–1.2)
GFR calc Af Amer: >60 mL/min (ref >60)
GFR calc Af Amer: 36 mL/min — ABNORMAL LOW (ref >60)
GFR calc Af Amer: 37 mL/min — ABNORMAL LOW (ref >60)
GFR calc non Af Amer: 50 mL/min — ABNORMAL LOW (ref >60)
GFR calc non Af Amer: 30 mL/min — ABNORMAL LOW (ref >60)
GFR calc non Af Amer: 45 mL/min — ABNORMAL LOW (ref >60)
Glucose, Bld: 114 mg/dL — ABNORMAL HIGH (ref 70–99)
Potassium: 3.3 mEq/L — ABNORMAL LOW (ref 3.5–5.1)
Potassium: 3.2 mEq/L — ABNORMAL LOW (ref 3.5–5.1)
Potassium: 4.2 mEq/L (ref 3.5–5.1)
Potassium: 3.4 mEq/L — ABNORMAL LOW (ref 3.5–5.1)
Sodium: 142 mEq/L (ref 135–145)
Sodium: 136 mEq/L (ref 135–145)
Total Bilirubin: 0.8 mg/dL (ref 0.3–1.2)
Total Bilirubin: 0.4 mg/dL (ref 0.3–1.2)
Total Bilirubin: 0.5 mg/dL (ref 0.3–1.2)
Total Bilirubin: 0.8 mg/dL (ref 0.3–1.2)
Total Bilirubin: 0.4 mg/dL (ref 0.3–1.2)
Total Protein: 7.5 g/dL (ref 6.0–8.3)

## 2011-04-07 LAB — URINALYSIS, ROUTINE W REFLEX MICROSCOPIC
Bilirubin Urine: NEGATIVE
Glucose, UA: NEGATIVE mg/dL
Glucose, UA: 250 mg/dL — AB
Ketones, ur: 15 mg/dL — AB
Leukocytes, UA: NEGATIVE
Nitrite: NEGATIVE
Protein, ur: 100 mg/dL — AB
Protein, ur: >300 mg/dL — AB
Specific Gravity, Urine: 1.03 (ref 1.005–1.030)
Specific Gravity, Urine: 1.012 (ref 1.005–1.030)
Urobilinogen, UA: 1 mg/dL (ref 0.0–1.0)
Urobilinogen, UA: 0.2 mg/dL (ref 0.0–1.0)
pH: 7.5 (ref 5.0–8.0)

## 2011-04-07 LAB — GLUCOSE, CAPILLARY
Glucose-Capillary: 195 mg/dL — ABNORMAL HIGH (ref 70–99)
Glucose-Capillary: 208 mg/dL — ABNORMAL HIGH (ref 70–99)
Glucose-Capillary: 259 mg/dL — ABNORMAL HIGH (ref 70–99)
Glucose-Capillary: 53 mg/dL — ABNORMAL LOW (ref 70–99)
Glucose-Capillary: 534 mg/dL (ref 70–99)
Glucose-Capillary: 57 mg/dL — ABNORMAL LOW (ref 70–99)
Glucose-Capillary: 62 mg/dL — ABNORMAL LOW (ref 70–99)
Glucose-Capillary: 80 mg/dL (ref 70–99)
Glucose-Capillary: 92 mg/dL (ref 70–99)

## 2011-04-07 LAB — LIPASE, BLOOD
Lipase: 27 U/L (ref 11–59)
Lipase: 31 U/L (ref 11–59)

## 2011-04-07 LAB — POCT PREGNANCY, URINE: Preg Test, Ur: NEGATIVE

## 2011-04-07 LAB — BASIC METABOLIC PANEL
BUN: 9 mg/dL (ref 6–23)
CO2: 25 mEq/L (ref 19–32)
Calcium: 8.8 mg/dL (ref 8.4–10.5)
Creatinine, Ser: 1.15 mg/dL (ref 0.4–1.2)
GFR calc non Af Amer: 51 mL/min — ABNORMAL LOW (ref >60)
Glucose, Bld: 108 mg/dL — ABNORMAL HIGH (ref 70–99)

## 2011-04-07 LAB — GASTRIC OCCULT BLOOD (1-CARD TO LAB): pH, Gastric: 5

## 2011-04-07 MED ORDER — MORPHINE SULFATE 10 MG/ML IJ SOLN
4.0000 mg | Freq: Once | INTRAMUSCULAR | Status: AC
  Administered 2011-04-07: 4 mg via INTRAMUSCULAR

## 2011-04-07 MED ORDER — PROMETHAZINE HCL 25 MG/ML IJ SOLN
25.0000 mg | Freq: Once | INTRAMUSCULAR | Status: AC
  Administered 2011-04-07: 25 mg via INTRAMUSCULAR

## 2011-04-07 NOTE — Progress Notes (Signed)
  Subjective:    Patient ID: [name] [name], female    DOB: May 09, 1966, 45 y.o.   MRN: [id number]  HPI  1) Cyclic emesis: Hospitalized 03/22/11 - 03/26/11 with abdominal pain, nausea / emesis cyclic vomiting per her usual episodes of cyclic vomiting. She responded well to IV fluids, Phenergan, Dilaudid and Ativan. It was felt that diabetic gastroparesis may be contributing to her symptoms (though previous gastric emptying study was negative)  and Reglan was re-started. Patient was also started on Carafate as well for esophageal irritation with emesis. Patient was advised to start carnitine and Co-enzyme Q10 given evidence of these reducing recurrence rate of symptoms. She continues to smoke marijuana, though this has been identified as a positive trigger for her symptoms (last used last week). Symptoms started today. Reports emesis x 2 this morning - non bloody, non-bilious - sore throat and heartburn.Unable to keep oral antiemetics down and she was afraid to give herself the phenergan IM at home. Last meal was last night without problems. Denies hematemesis, diarrhea, constipation, melena, hematochezia. Plan was for patient to follow up at [facility] (with case worker assigned to arrange transport as this has been an issue in the past interms of preventing follow up).     Review of Systems As above. Negative for fever, chills,     Objective:   Physical Exam  Constitutional: She appears well-developed and well-nourished.       Appears to be in significant pain   HENT:  Mouth/Throat: Oropharynx is clear and moist. No oropharyngeal exudate.  Cardiovascular: Normal rate, regular rhythm, normal heart sounds and intact distal pulses.   No murmur heard. Pulmonary/Chest: Effort normal and breath sounds normal.  Abdominal: Soft. She exhibits no distension and no mass. Bowel sounds are increased. There is generalized tenderness. There is no rigidity, no rebound, no guarding, no CVA tenderness, no  tenderness at McBurney's point and negative Murphy's sign.  Lymphadenopathy:    She has no cervical adenopathy.          Assessment & Plan:

## 2011-04-07 NOTE — Assessment & Plan Note (Addendum)
Cyclic emesis (possibly with some element of diabetic gastroparesis - though prior gastric emptying study has been negative). Possibly triggered by marijuana use. Phenergan IM and Morphine IM given in clinic with improvement. Patient able to tolerate ice chips and water prior to leaving, and pain resolved. Advised to continue to work with case manager to get transport arranged for follow up with [facility]. Advised to avoid triggers. Continue home regimen. Reviewed red flags that would prompt return to care.

## 2011-04-08 ENCOUNTER — Ambulatory Visit: Payer: Medicare Other | Admitting: Family Medicine

## 2011-04-08 ENCOUNTER — Inpatient Hospital Stay (HOSPITAL_COMMUNITY)
Admission: EM | Admit: 2011-04-08 | Discharge: 2011-04-12 | DRG: 392 | Disposition: A | Discharge status: Home / Self Care | Payer: Medicare Other | Attending: Family Medicine | Admitting: Family Medicine

## 2011-04-08 ENCOUNTER — Encounter: Payer: Self-pay | Admitting: Family Medicine

## 2011-04-08 ENCOUNTER — Telehealth: Payer: Self-pay | Admitting: Family Medicine

## 2011-04-08 DIAGNOSIS — G43909 Migraine, unspecified, not intractable, without status migrainosus: Secondary | ICD-10-CM | POA: Diagnosis present

## 2011-04-08 DIAGNOSIS — I129 Hypertensive chronic kidney disease with stage 1 through stage 4 chronic kidney disease, or unspecified chronic kidney disease: Secondary | ICD-10-CM | POA: Diagnosis present

## 2011-04-08 DIAGNOSIS — F17200 Nicotine dependence, unspecified, uncomplicated: Secondary | ICD-10-CM | POA: Diagnosis present

## 2011-04-08 DIAGNOSIS — I10 Essential (primary) hypertension: Secondary | ICD-10-CM

## 2011-04-08 DIAGNOSIS — E118 Type 2 diabetes mellitus with unspecified complications: Secondary | ICD-10-CM

## 2011-04-08 DIAGNOSIS — K449 Diaphragmatic hernia without obstruction or gangrene: Secondary | ICD-10-CM | POA: Diagnosis present

## 2011-04-08 DIAGNOSIS — R112 Nausea with vomiting, unspecified: Principal | ICD-10-CM | POA: Diagnosis present

## 2011-04-08 DIAGNOSIS — Z794 Long term (current) use of insulin: Secondary | ICD-10-CM

## 2011-04-08 DIAGNOSIS — K2100 Gastro-esophageal reflux disease with esophagitis, without bleeding: Secondary | ICD-10-CM | POA: Diagnosis present

## 2011-04-08 DIAGNOSIS — D649 Anemia, unspecified: Secondary | ICD-10-CM | POA: Diagnosis present

## 2011-04-08 DIAGNOSIS — D72829 Elevated white blood cell count, unspecified: Secondary | ICD-10-CM | POA: Diagnosis present

## 2011-04-08 DIAGNOSIS — F341 Dysthymic disorder: Secondary | ICD-10-CM | POA: Diagnosis present

## 2011-04-08 DIAGNOSIS — M549 Dorsalgia, unspecified: Secondary | ICD-10-CM | POA: Diagnosis present

## 2011-04-08 DIAGNOSIS — E1149 Type 2 diabetes mellitus with other diabetic neurological complication: Secondary | ICD-10-CM | POA: Diagnosis present

## 2011-04-08 DIAGNOSIS — K3184 Gastroparesis: Secondary | ICD-10-CM | POA: Diagnosis present

## 2011-04-08 DIAGNOSIS — F1210 Cannabis abuse, uncomplicated: Secondary | ICD-10-CM | POA: Diagnosis present

## 2011-04-08 DIAGNOSIS — N289 Disorder of kidney and ureter, unspecified: Secondary | ICD-10-CM | POA: Diagnosis present

## 2011-04-08 DIAGNOSIS — N189 Chronic kidney disease, unspecified: Secondary | ICD-10-CM | POA: Diagnosis present

## 2011-04-08 DIAGNOSIS — R1110 Vomiting, unspecified: Secondary | ICD-10-CM

## 2011-04-08 DIAGNOSIS — Z79899 Other long term (current) drug therapy: Secondary | ICD-10-CM

## 2011-04-08 DIAGNOSIS — G4700 Insomnia, unspecified: Secondary | ICD-10-CM | POA: Diagnosis present

## 2011-04-08 DIAGNOSIS — R1115 Cyclical vomiting syndrome unrelated to migraine: Secondary | ICD-10-CM | POA: Diagnosis present

## 2011-04-08 DIAGNOSIS — Z7982 Long term (current) use of aspirin: Secondary | ICD-10-CM

## 2011-04-08 DIAGNOSIS — G8929 Other chronic pain: Secondary | ICD-10-CM | POA: Diagnosis present

## 2011-04-08 DIAGNOSIS — E1142 Type 2 diabetes mellitus with diabetic polyneuropathy: Secondary | ICD-10-CM | POA: Diagnosis present

## 2011-04-08 LAB — URINALYSIS, ROUTINE W REFLEX MICROSCOPIC
Bilirubin Urine: NEGATIVE
Glucose, UA: >1000 mg/dL — AB
Ketones, ur: 15 mg/dL — AB
Nitrite: NEGATIVE
Specific Gravity, Urine: 1.023 (ref 1.005–1.030)
pH: 7 (ref 5.0–8.0)

## 2011-04-08 LAB — BASIC METABOLIC PANEL
Calcium: 8.7 mg/dL (ref 8.4–10.5)
GFR calc non Af Amer: 23 mL/min — ABNORMAL LOW (ref >60)
Glucose, Bld: 193 mg/dL — ABNORMAL HIGH (ref 70–99)
Sodium: 135 mEq/L (ref 135–145)

## 2011-04-08 LAB — POCT CARDIAC MARKERS
CKMB, poc: 1.2 ng/mL (ref 1.0–8.0)
Troponin i, poc: <0.05 ng/mL (ref 0.00–0.09)

## 2011-04-08 LAB — CBC
MCH: 31.6 pg (ref 26.0–34.0)
Platelets: 307 10*3/uL (ref 150–400)
RBC: 3.89 MIL/uL (ref 3.87–5.11)
RDW: 12.7 % (ref 11.5–15.5)
WBC: 13.5 10*3/uL — ABNORMAL HIGH (ref 4.0–10.5)

## 2011-04-08 LAB — URINE MICROSCOPIC-ADD ON

## 2011-04-08 LAB — COMPREHENSIVE METABOLIC PANEL
Albumin: 4 g/dL (ref 3.5–5.2)
Alkaline Phosphatase: 82 U/L (ref 39–117)
BUN: 25 mg/dL — ABNORMAL HIGH (ref 6–23)
Chloride: 84 mEq/L — ABNORMAL LOW (ref 96–112)
Creatinine, Ser: 2.21 mg/dL — ABNORMAL HIGH (ref 0.4–1.2)
Glucose, Bld: 490 mg/dL — ABNORMAL HIGH (ref 70–99)
Potassium: 3.3 mEq/L — ABNORMAL LOW (ref 3.5–5.1)
Total Bilirubin: 1 mg/dL (ref 0.3–1.2)

## 2011-04-08 LAB — DIFFERENTIAL
Basophils Relative: 0 % (ref 0–1)
Eosinophils Absolute: 0 10*3/uL (ref 0.0–0.7)
Eosinophils Relative: 0 % (ref 0–5)
Neutrophils Relative %: 78 % — ABNORMAL HIGH (ref 43–77)

## 2011-04-08 LAB — GLUCOSE, CAPILLARY: Glucose-Capillary: 362 mg/dL — ABNORMAL HIGH (ref 70–99)

## 2011-04-08 LAB — CK TOTAL AND CKMB (NOT AT ARMC)
CK, MB: 2.2 ng/mL (ref 0.3–4.0)
Relative Index: INVALID (ref 0.0–2.5)

## 2011-04-08 LAB — RAPID URINE DRUG SCREEN, HOSP PERFORMED
Amphetamines: NOT DETECTED
Barbiturates: NOT DETECTED
Benzodiazepines: NOT DETECTED
Tetrahydrocannabinol: POSITIVE — AB

## 2011-04-08 LAB — LIPASE, BLOOD: Lipase: 34 U/L (ref 11–59)

## 2011-04-08 NOTE — H&P (Signed)
[facility] Admission History and Physical  Patient name: JAELYNN COLOM Medical record number: [id number] Date of birth: 12/10/1966 Age: 45 y.o. Gender: female  Primary Care Provider: Mariana Arn, MD  Chief Complaint: nausea and vomiting History of Present Illness: BRIELEE GHOSH is a 45 y.o. year old female patient well known to the [facility] team with history of cyclic vomiting and who presents today with 2 day history of nausea and vomiting.  Episodes started yesterday.  She was seen at [facility] yesterday and given injection of morphine as well as injection Phenergan.  Patient states these medications helped initially but as they wore off she became nauseous again.  Vomited enough to fill "half a trashcan full" last night.  Describes vomiting as non-bloody, non-bilious.  Does have history of blood in vomit on prior admits.  Also admits to sharp, burning epigastric and chest pain.  Unable to tolerate any PO meds at home.  Did attempt PR Phenergan but no relief.  Vomiting persisted to today. Returned to clinic this AM but left to go to ED before she was seen by a physician.  Endorses chest pain without palpitations, no shortness of breath, no change in bowel habits.  Has been unable to tolerate PO intake since yesterday.      Patient Active Problem List  Diagnoses  . NEUROPATHY, DIABETIC  . DIABETES MELLITUS, II, COMPLICATIONS  . UNSPECIFIED ANEMIA  . DEPRESSION, MAJOR, RECURRENT  . ANXIETY  . TOBACCO DEPENDENCE  . MIGRAINE, UNSPEC., W/O INTRACTABLE MIGRAINE  . HYPERTENSION, BENIGN SYSTEMIC  . GERD  . PERSISTENT VOMITING  . PANCREATITIS  . UNSPECIFIED VAGINITIS AND VULVOVAGINITIS  . VAGINAL PRURITUS  . CALLUSES, FEET, BILATERAL  . BACK PAIN, CHRONIC  . INSOMNIA NOS  . ABDOMINAL PAIN  . HIGH RISK PATIENT  . MUSCLE SPASM, TRAPEZIUS   Past Surgical History: 1. Cholecystectomy.   2. Total abdominal hysterectomy and right  salpingo-oophorectomy secondary to tubo-ovarian abscess.   3. Exploratory lap with lysis of adhesions secondary to small bowel obstruction.   Social History: History   Social History  . Marital Status: Single    Spouse Name: N/A    Number of Children: N/A  . Years of Education: N/A   Social History Main Topics  . Smoking status: Current Everyday Smoker -- 0.3 packs/day    Types: Cigarettes  . Smokeless tobacco: Never Used  . Alcohol Use: Not on file  . Drug Use: Not on file  . Sexually Active: Not on file   Other Topics Concern  . Not on file   Social History Narrative  . No narrative on file    Family History: DM II, HTN mother and father side  Allergies: Allergies  Allergen Reactions  . Erythromycin Nausea And Vomiting  . Oxycodone-Acetaminophen Itching    No current outpatient prescriptions on file.   Review Of Systems: Per HPI.  Otherwise 12 point review of systems was performed and was unremarkable.  Physical Exam: Pulse: 110  Blood Pressure: 207/106 RR: 18   O2: 99 on RA Temp: 98.7  General: cooperative, appears stated age and distracted HEENT: PERRLA, extra ocular movement intact, sclera clear, anicteric and Oropharynx pink and dry mucus membranes.  Neck supple, trachea midline, question of goiter on exam.  No lymphadenopathy Heart: 2/6 SEM is heard at 2nd left intercostal space, regular rate and rhythm Lungs: clear to auscultation, no wheezes or rales and unlabored breathing Abdomen: moderate tenderness in the in the epigastrium. Extremities:  extremities normal, atraumatic, no cyanosis or edema Skin:no rashes, no ecchymoses Neurology: normal without focal findings, mental status, speech normal, alert and oriented x3, PERLA, cranial nerves 2-12 intact, reflexes normal and symmetric and sensation grossly normal  Labs and Imaging: Lab Results  Component Value Date/Time   NA 131* 04/08/2011 11:33 AM   K 3.3* 04/08/2011 11:33 AM   CL 84* 04/08/2011 11:33 AM    CO2 30 04/08/2011 11:33 AM   BUN 25* 04/08/2011 11:33 AM   CREATININE 2.21* 04/08/2011 11:33 AM   GLUCOSE 490* 04/08/2011 11:33 AM   Lab Results  Component Value Date   WBC 13.5* 04/08/2011   HGB 12.3 04/08/2011   HCT 35.1* 04/08/2011   MCV 90.2 04/08/2011   PLT 307 04/08/2011   UA:   Color, Urine                             YELLOW            YELLOW  Appearance                               CLEAR             CLEAR  Specific Gravity                         1.023             1.005-1.030  pH                                       7.0               5.0-8.0  Urine Glucose                            >1000      a      NEG              mg/dL  Bilirubin                                NEGATIVE          NEG  Ketones                                  15         a      NEG              mg/dL  Blood                                    MODERATE   a      NEG  Protein                                  >300       a      NEG              mg/dL  Urobilinogen  0.2               0.0-1.0          mg/dL  Nitrite                                  NEGATIVE          NEG  Leukocytes                               NEGATIVE          NEG  2-3 RBCs  Few bacteria  Lipase 34  CE's:  Myoglobin 312, CKMB 1.2, Troponin <0.05  Imaging:   Abdominal x-ray:    1.  No active cardiopulmonary disease in one-view.  2.  No acute or specific abdominal findings.   Assessment and Plan: GISELA TRUSSEL is a 45 y.o. year old female presenting with recurrent episode of nausea and vomiting with inability to tolerate PO intake: 1. Cyclic vomiting:  As above, given Ativan, Zofran, Dilaudid, Phenergan in ED with minimal improvement.  I witnessed vomiting.  Non-bloody, non-bilious.  Plan to admit with usual Dilaudid, Zofran, Phenergan.  Has been on Reglan in past with some improvement, plan to restart this once patient is taking PO medications again.  Dilaudid for epigastric pain, low dose and plan to switch to morphine  and then PO meds as soon as possible.  Will continue IV protonix as patient does have history of bloody vomiting.  Gastroccult vomit.  Patient does have history of cyclic vomiting triggered by marijuana use, denies currently, will check UDS.  Unclear etiology, no clear signs of infection.  On record review, patient has not been seen by GI in quite some time, will discuss with team for possible GI consult in AM.  Of note, patient does have anion gap, see below.  Cycle cardiac enzymes, EKG in am.   2.  DM II:  Insulin dependent.  Patient states she has been taking her medications but she is hyperglycemic with anion gap of 17.  Bicarb is 30, likely contraction metabolic alkalosis, possibly mixed disorder.  Only way to know for sure would be to check ABG, will defer at this time as patient not in extremis and it would not change management.  Will start Lantus 10 units QHS as she takes this at home, place on sliding scale, continue IVF for both repletion and maintenance, and follow serial BMETs until gap closes.  Check serum ketones though doubtful these will be elevated.  3.  HTN:  Hypertensive urgency, greater than 200/100 in ED.  Frequently encounter this on admission.  Corrects with IV medications.  Patient placed on weekly Clonidine patch last admission, she is not currently wearing a patch now so this could possibly be rebound HTN along with hypertension secondary to vomiting.   4.  Leukocytosis:  WBC >13 on admission today.  On review, she does not have history of elevated WBC even when she first presents to ED.  Again this is likely secondary to stress.  No clear evidence of infection by CXR, UA, history or physical.  Patient does endorse sore throat for 1 day prior to N/V and states it feels like her "tonsils are swollen" on Left side.  Question if I palpate goiter.  Will check TSH. 5.  ARF:  History of this, but creatinine grossly elevated  today.  Will check FeNa, spot protein creatinine ratio as she showed  >300 protein on UA, though also with blood present.  Repeat UA in AM.  Likely secondary to prerenal dehydration. 6. FEN/GI: NPO, continue IVF at 225 cc/hr.   7. Prophylaxis: heparin 8. Disposition: home when condition permits

## 2011-04-08 NOTE — Telephone Encounter (Signed)
Pt is requesting admission to hospital.  Feeling real bad

## 2011-04-08 NOTE — Telephone Encounter (Signed)
[name] [name] states she actually  scheduled patient appointment here for this AM.

## 2011-04-09 LAB — HEMOGLOBIN A1C
Hgb A1c MFr Bld: 9.5 % — ABNORMAL HIGH (ref <5.7)
Mean Plasma Glucose: 226 mg/dL — ABNORMAL HIGH (ref <117)

## 2011-04-09 LAB — RENAL FUNCTION PANEL
BUN: 20 mg/dL (ref 6–23)
Calcium: 7.8 mg/dL — ABNORMAL LOW (ref 8.4–10.5)
Creatinine, Ser: 1.84 mg/dL — ABNORMAL HIGH (ref 0.4–1.2)
Glucose, Bld: 102 mg/dL — ABNORMAL HIGH (ref 70–99)
Phosphorus: 2.1 mg/dL — ABNORMAL LOW (ref 2.3–4.6)

## 2011-04-09 LAB — CARDIAC PANEL(CRET KIN+CKTOT+MB+TROPI)
CK, MB: 1.8 ng/mL (ref 0.3–4.0)
Total CK: 85 U/L (ref 7–177)
Troponin I: 0.01 ng/mL (ref 0.00–0.06)

## 2011-04-09 LAB — CBC
Hemoglobin: 10.3 g/dL — ABNORMAL LOW (ref 12.0–15.0)
MCHC: 34 g/dL (ref 30.0–36.0)
RDW: 13.2 % (ref 11.5–15.5)

## 2011-04-09 LAB — GLUCOSE, CAPILLARY
Glucose-Capillary: 107 mg/dL — ABNORMAL HIGH (ref 70–99)
Glucose-Capillary: 122 mg/dL — ABNORMAL HIGH (ref 70–99)

## 2011-04-10 ENCOUNTER — Other Ambulatory Visit: Payer: Self-pay | Admitting: Gastroenterology

## 2011-04-10 DIAGNOSIS — K449 Diaphragmatic hernia without obstruction or gangrene: Secondary | ICD-10-CM

## 2011-04-10 DIAGNOSIS — R112 Nausea with vomiting, unspecified: Secondary | ICD-10-CM

## 2011-04-10 DIAGNOSIS — K209 Esophagitis, unspecified: Secondary | ICD-10-CM

## 2011-04-10 LAB — BASIC METABOLIC PANEL
CO2: 24 mEq/L (ref 19–32)
Chloride: 104 mEq/L (ref 96–112)
GFR calc Af Amer: 46 mL/min — ABNORMAL LOW (ref >60)
Glucose, Bld: 232 mg/dL — ABNORMAL HIGH (ref 70–99)
Sodium: 136 mEq/L (ref 135–145)

## 2011-04-10 LAB — GLUCOSE, CAPILLARY
Glucose-Capillary: 183 mg/dL — ABNORMAL HIGH (ref 70–99)
Glucose-Capillary: 160 mg/dL — ABNORMAL HIGH (ref 70–99)
Glucose-Capillary: 334 mg/dL — ABNORMAL HIGH (ref 70–99)

## 2011-04-10 LAB — CBC
HCT: 28.3 % — ABNORMAL LOW (ref 36.0–46.0)
Hemoglobin: 9.5 g/dL — ABNORMAL LOW (ref 12.0–15.0)
MCH: 31 pg (ref 26.0–34.0)
MCHC: 33.6 g/dL (ref 30.0–36.0)
RBC: 3.06 MIL/uL — ABNORMAL LOW (ref 3.87–5.11)

## 2011-04-11 DIAGNOSIS — R112 Nausea with vomiting, unspecified: Secondary | ICD-10-CM

## 2011-04-11 DIAGNOSIS — K2100 Gastro-esophageal reflux disease with esophagitis, without bleeding: Secondary | ICD-10-CM

## 2011-04-11 LAB — BASIC METABOLIC PANEL
BUN: 5 mg/dL — ABNORMAL LOW (ref 6–23)
BUN: 5 mg/dL — ABNORMAL LOW (ref 6–23)
BUN: 4 mg/dL — ABNORMAL LOW (ref 6–23)
Calcium: 7.9 mg/dL — ABNORMAL LOW (ref 8.4–10.5)
Calcium: 8.2 mg/dL — ABNORMAL LOW (ref 8.4–10.5)
Calcium: 8.5 mg/dL (ref 8.4–10.5)
Chloride: 107 mEq/L (ref 96–112)
Chloride: 108 mEq/L (ref 96–112)
Creatinine, Ser: 1.51 mg/dL — ABNORMAL HIGH (ref 0.4–1.2)
Creatinine, Ser: 1.48 mg/dL — ABNORMAL HIGH (ref 0.4–1.2)
Creatinine, Ser: 1.58 mg/dL — ABNORMAL HIGH (ref 0.4–1.2)
GFR calc Af Amer: 46 mL/min — ABNORMAL LOW (ref >60)
GFR calc non Af Amer: 37 mL/min — ABNORMAL LOW (ref >60)
Glucose, Bld: 182 mg/dL — ABNORMAL HIGH (ref 70–99)

## 2011-04-11 LAB — CBC
MCH: 30.9 pg (ref 26.0–34.0)
MCHC: 34.1 g/dL (ref 30.0–36.0)
MCV: 90.8 fL (ref 78.0–100.0)
Platelets: 201 10*3/uL (ref 150–400)
RDW: 12.6 % (ref 11.5–15.5)

## 2011-04-11 LAB — GLUCOSE, CAPILLARY
Glucose-Capillary: 156 mg/dL — ABNORMAL HIGH (ref 70–99)
Glucose-Capillary: 161 mg/dL — ABNORMAL HIGH (ref 70–99)
Glucose-Capillary: 180 mg/dL — ABNORMAL HIGH (ref 70–99)
Glucose-Capillary: 181 mg/dL — ABNORMAL HIGH (ref 70–99)

## 2011-04-12 ENCOUNTER — Telehealth: Payer: Self-pay | Admitting: Family Medicine

## 2011-04-12 ENCOUNTER — Inpatient Hospital Stay (HOSPITAL_COMMUNITY): Payer: Medicare Other

## 2011-04-12 DIAGNOSIS — R109 Unspecified abdominal pain: Secondary | ICD-10-CM

## 2011-04-12 DIAGNOSIS — R1115 Cyclical vomiting syndrome unrelated to migraine: Secondary | ICD-10-CM

## 2011-04-12 LAB — COMPREHENSIVE METABOLIC PANEL
ALT: 9 U/L (ref 0–35)
AST: 12 U/L (ref 0–37)
Albumin: 2.9 g/dL — ABNORMAL LOW (ref 3.5–5.2)
Alkaline Phosphatase: 61 U/L (ref 39–117)
CO2: 21 mEq/L (ref 19–32)
Calcium: 7.8 mg/dL — ABNORMAL LOW (ref 8.4–10.5)
Chloride: 109 mEq/L (ref 96–112)
Chloride: 116 mEq/L — ABNORMAL HIGH (ref 96–112)
Creatinine, Ser: 1.09 mg/dL (ref 0.4–1.2)
GFR calc Af Amer: >60 mL/min (ref >60)
GFR calc non Af Amer: 55 mL/min — ABNORMAL LOW (ref >60)
Glucose, Bld: 191 mg/dL — ABNORMAL HIGH (ref 70–99)
Potassium: 3.3 mEq/L — ABNORMAL LOW (ref 3.5–5.1)
Sodium: 138 mEq/L (ref 135–145)
Total Protein: 5.3 g/dL — ABNORMAL LOW (ref 6.0–8.3)

## 2011-04-12 LAB — CBC
HCT: 27.9 % — ABNORMAL LOW (ref 36.0–46.0)
Hemoglobin: 9.6 g/dL — ABNORMAL LOW (ref 12.0–15.0)
Hemoglobin: 11.7 g/dL — ABNORMAL LOW (ref 12.0–15.0)
MCH: 31.1 pg (ref 26.0–34.0)
MCHC: 34.4 g/dL (ref 30.0–36.0)
MCHC: 33.1 g/dL (ref 30.0–36.0)
MCV: 90.3 fL (ref 78.0–100.0)
MCV: 96.2 fL (ref 78.0–100.0)
Platelets: 198 K/uL (ref 150–400)
Platelets: 173 10*3/uL (ref 150–400)
RBC: 3.09 MIL/uL — ABNORMAL LOW (ref 3.87–5.11)
RBC: 3.65 MIL/uL — ABNORMAL LOW (ref 3.87–5.11)
RDW: 12.7 % (ref 11.5–15.5)
WBC: 4.2 K/uL (ref 4.0–10.5)
WBC: 5.7 10*3/uL (ref 4.0–10.5)

## 2011-04-12 LAB — GLUCOSE, CAPILLARY
Glucose-Capillary: 107 mg/dL — ABNORMAL HIGH (ref 70–99)
Glucose-Capillary: 108 mg/dL — ABNORMAL HIGH (ref 70–99)
Glucose-Capillary: 112 mg/dL — ABNORMAL HIGH (ref 70–99)
Glucose-Capillary: 168 mg/dL — ABNORMAL HIGH (ref 70–99)
Glucose-Capillary: 168 mg/dL — ABNORMAL HIGH (ref 70–99)
Glucose-Capillary: 170 mg/dL — ABNORMAL HIGH (ref 70–99)
Glucose-Capillary: 258 mg/dL — ABNORMAL HIGH (ref 70–99)
Glucose-Capillary: 67 mg/dL — ABNORMAL LOW (ref 70–99)
Glucose-Capillary: 75 mg/dL (ref 70–99)
Glucose-Capillary: 94 mg/dL (ref 70–99)

## 2011-04-12 LAB — BASIC METABOLIC PANEL
BUN: 6 mg/dL (ref 6–23)
Chloride: 116 mEq/L — ABNORMAL HIGH (ref 96–112)
GFR calc non Af Amer: 37 mL/min — ABNORMAL LOW (ref >60)
Glucose, Bld: 122 mg/dL — ABNORMAL HIGH (ref 70–99)
Potassium: 4.3 mEq/L (ref 3.5–5.1)
Potassium: 4.4 mEq/L (ref 3.5–5.1)
Sodium: 135 mEq/L (ref 135–145)

## 2011-04-12 LAB — PROTIME-INR: INR: 1 (ref 0.00–1.49)

## 2011-04-12 LAB — DIFFERENTIAL
Basophils Relative: 0 % (ref 0–1)
Eosinophils Absolute: 0.1 10*3/uL (ref 0.0–0.7)
Monocytes Relative: 5 % (ref 3–12)
Neutrophils Relative %: 74 % (ref 43–77)

## 2011-04-12 LAB — LIPASE, BLOOD: Lipase: 40 U/L (ref 11–59)

## 2011-04-12 MED ORDER — TECHNETIUM TC 99M SULFUR COLLOID
2.0000 | Freq: Once | INTRAVENOUS | Status: AC | PRN
  Administered 2011-04-12: 2 via INTRAVENOUS

## 2011-04-12 NOTE — Telephone Encounter (Signed)
I spoke with Ms Dalley via her hospital room phone.  She was upset about an interaction with the [facility].  I told her I would discuss it with the [facility].

## 2011-04-12 NOTE — H&P (Signed)
NAME:  [name] [name], [name] [name]               ACCOUNT NO.:  [id number]  MEDICAL RECORD NO.:  [id number]           PATIENT TYPE:  E  LOCATION:  [facility]                         FACILITY:  [facility]  PHYSICIAN:  Blane Ohara Glover Capano, M.D.DATE OF BIRTH:  [birth year]  DATE OF ADMISSION:  02/15/2011 DATE OF DISCHARGE:                             HISTORY & PHYSICAL   PCP:  Dr. Mariana Arn, [facility].  CHIEF COMPLAINT:  Abdominal pain, nausea, and vomiting.  HISTORY OF PRESENT ILLNESS:  The patient is a 45 year old female with a known history of cyclical vomiting that has been worked up in the past, who presents with acute onset of abdominal pain associated with nausea and vomiting times multiple episodes times 12 hours.  She described sudden onset of occasionally sharp and occasionally dull epigastric abdominal pain that woke her from sleep.  She states that the pain comes and goes with persistently 8/10.  She states that the vomiting start around 30 minutes after the pain.  It is nonbloody, watery emesis times multiple episodes.  She has not been able to eat or drink anything today.  She states that nothing makes it better or worse.  She denies trauma, fever, diarrhea, sick contact, dysuria, urgency, hesitancy, or vaginal discharge.  She was recently treated in the clinic for Trichomonas and candidiasis with a 7-day course of Flagyl and fluconazole times one dose.  She also denies headache, chest pain, shortness of breath, weakness, and syncope.  ED COURSE:  The patient initially examined, treated, and discharged from the ED.  She vomited x1 in the waiting room and refused to go home.  She received Ativan 1 mg IV times one dose while awaiting for the [facility] to evaluate her.  CURRENT MEDICATIONS:  At home the patient takes, 1. Amlodipine 10 mg p.o. daily. 2. Carvedilol 25 mg p.o. twice daily with meals. 3. Aspirin 81 mg p.o. daily. 4. Flexeril 5 mg 2  tablets p.o. t.i.d. as needed. 5. Colace 100 mg capsule 2 tablets daily as needed. 6. EpiPen 0.3 mg/0.3 mL one dose IM as needed for severe anaphylaxis. 7. Vicodin 5/500 mg per tab, 1-2 tablets p.o. every 6 hours as needed. 8. Lantus 30 units subcu q.a.m. 9. Ativan 1 mg p.o. every 6 hours as needed for nausea. 10.Metformin 1000 mg p.o. twice daily with meals. 11.Remeron 30 mg p.o. daily at bedtime. 12.Prilosec 20 mg p.o. daily. 13.MiraLax 1 packet with water once daily as needed for constipation. 14.Phenergan 25 mg suppository, placed 25 mg rectally every 6 hours as     needed. 15.Phenergan 25 mg tab, take 25 mg by mouth every 4 hours as needed     for nausea. 16.Phenergan 25 mg IM every 6 hours as needed. 17.Tramadol 50 mg 1 tablet p.o. every 6 hours as needed for pain.  ALLERGIES:  The patient is allergic to ERYTHROMYCIN AND PERCOCET.  PAST MEDICAL HISTORY:  Significant for cyclic vomiting syndrome, abdominal migraines with multiple admissions for both.  The patient had normal gastric emptying study in 2008.  EGD per Dr. Henrene Pastor in 2005 showed esophageal inflammation, ulceration, and  hiatal hernia.  EGD in 2007/03/30 showed thickened gastric fold and fundus.  The patient has chronic gastritis.  No H. pylori.  No metaplasia.  She is evaluated at [facility] in 03-29-2009.  Had repeat gastric emptying study that was normal.  Recurrent UTIs.  The patient has been hospitalized in the past for pyelo.  Cardiac cath, normal EF, normal coronaries in 03/30/07. History of hypertension and diabetes type 2.  Last A1c 10.2 in December 2011.  FAMILY HISTORY:  Diabetes in first-degree relative.  Mother died of MI in 03-30-03.  Three or four siblings with hypertension.  No family history of migraines or cyclic vomiting.  No family history of colon cancer.  SOCIAL HISTORY:  The patient in a monogamous relationship.  She has 4 children, all in the 03-30-2023, she lives alone.  She has 10th grade education.   Denies physical abuse.  Denies ethanol.  Says she quit alcohol 2 years ago.  She admits to cigarette smoking, a 30 pack a day for the past 25 years.  She admits to marijuana use, last use 1 week ago.  She denies other illicit drug use.  She works at [employer].  REVIEW OF SYSTEMS:  As per HPI.  PHYSICAL EXAM:  VITALS:  Reviewed.  Temperature 97.9; blood pressure, initial 216/112, rechecked 167/80; heart rate 103, rechecked 101; respiratory rate 18; O2 sat 99% to 100% on room air. GENERAL:  The patient is a well-developed adult female.  She was asleep, but arousable. HEENT:  Pupils are equal and round, accommodating, but nonicteric. Moist mucous membranes.  Oropharynx was nonerythematous. NECK:  Nontender.  No lymphadenopathy. CARDIOVASCULAR:  S1-S2, tachy.  Regular rate and rhythm.  No murmurs, rubs, or gallops. RESPIRATORY:  Normal work of breathing.  Clear to auscultation bilaterally. ABDOMEN:  Normoactive bowel sounds.  Soft.  Moderate tender to palpation in the epigastric and suprapubic area.  No rebound.  No guarding.  No masses. GU:  No CVA tenderness. MUSCULOSKELETAL:  5/5 strength in upper and lower extremities bilaterally. SKIN:  Dry, normal turgor. NEURO:  Cranial nerves II through XII grossly intact.  Strength and motor intact.  LABS:  White blood cell count 10, hemoglobin 13.2, hematocrit 36.9, MCV 91.1, platelets 234.  Sodium 133, potassium 3.5, chloride 102, bicarb 19, BUN 20, creatinine 1.67, AST 15, ALT 11, total bili 0.5, alk phos 75, lipase 34.  Hemoglobin A1c 10.2 in December 16, 2010.  UA is cloudy, specific gravity of 1.017, glucose 250, ketones 15, moderate blood, protein 100, negative nitrite and leukocyte esterase, U micro, rare epi's, 0-2 blood and white blood cells, many bacteria.  IMAGING:  Abdominal x-ray, nonobstructive bowel gas pattern with moderate proximal colonic fecal material, no free air.  ASSESSMENT AND PLAN:  For this 45 year old female,  presenting with abdominal pain, nausea, and vomiting. 1. Abdominal pain, nausea, and vomiting.  Features consistent with the     patient's cyclical vomiting episodes in the past.  I have also     considered infectious gastroenteritis, but this is less likely     given lack of fever, sick contacts, and diarrhea, also consider     PID, but the patient denies vaginal discharge, and completed     treatment for Trichomonas. We will admit to [place], make the patient n.p.o. and attempt to relieve     nausea with Zofran 4 mg IV q.6, Phenergan suppository 25 mg q.6     p.r.n., and Ativan  1 mg IV q.6 p.r.n.  For pain control, morphine 2     mg IV q.2 scheduled and Dilaudid 2 mg IV q.4 p.r.n.      I did not plan to obtain further imaging at this     time, but I will do so if the patient clinically worsens. 2. Possible urinary tract infection.  The patient does not have     urinary symptoms and her UA is more supported with dehydration.     However, moderate blood in one UA and many bacteria what appears to     be a U micro clean catch specimen along with suprapubic tenderness     warrants treatment in this patient who consistently has positive     urine cultures in the setting of indeterminate UAs in the past did     not have leukocyte esterase or nitrites, and has a positive greater     than 100,000 Escherichia coli most recently in October 2011.  We     will treat empirically with Rocephin after obtaining urine for     culture.  FEN/GI, NPO except for sips and chips.IVF: D5 half normal     saline at 125 mL per hour. 3. Insulin-dependent diabetes, type 2.  Last A1c was 10.2.  We will     consider sliding scale insulin, Lantus 15 units subcu q.a.m.  We     will recheck A1c. 4. Hypertension, elevated blood pressure compared to baseline, is     trending now likely secondary to the patient's lack of missed doses     of antihypertensives.  There is no evidence of acute  end-organ     damage.  Hydralazine p.r.n. until the patient can be transition     back to home meds. 5. Deep vein thrombosis prophylaxis, heparin 5000 units subcu t.i.d. 6. Disposition:  Anticipate discharging the patient to home in the     next 1-2 days.  Pending clinical improvement.    ______________________________ Boykin Nearing, MD   ______________________________ Blane Ohara Chaelyn Bunyan, M.D.    JF/MEDQ  D:  02/15/2011  T:  02/15/2011  Job:  [id number]  cc:   Mariana Arn, MD  Electronically Signed by Boykin Nearing MD on 04/10/2011 03:18:40 PM Electronically Signed by Lissa Morales M.D. on 04/12/2011 09:37:02 AM

## 2011-04-12 NOTE — Telephone Encounter (Signed)
Ms. Senger, calling from her hospital room is requesting a call back from you regarding an encounter she had with one of the residents this morning.  Very upset and need to speak with you asap.

## 2011-04-13 LAB — BASIC METABOLIC PANEL
BUN: 21 mg/dL (ref 6–23)
CO2: 21 mEq/L (ref 19–32)
Chloride: 111 mEq/L (ref 96–112)
GFR calc Af Amer: 56 mL/min — ABNORMAL LOW (ref >60)
GFR calc non Af Amer: 47 mL/min — ABNORMAL LOW (ref >60)
Glucose, Bld: 133 mg/dL — ABNORMAL HIGH (ref 70–99)
Glucose, Bld: 155 mg/dL — ABNORMAL HIGH (ref 70–99)
Potassium: 4.2 mEq/L (ref 3.5–5.1)
Potassium: 4.5 mEq/L (ref 3.5–5.1)
Sodium: 139 mEq/L (ref 135–145)

## 2011-04-13 LAB — GLUCOSE, CAPILLARY
Glucose-Capillary: 125 mg/dL — ABNORMAL HIGH (ref 70–99)
Glucose-Capillary: 132 mg/dL — ABNORMAL HIGH (ref 70–99)
Glucose-Capillary: 149 mg/dL — ABNORMAL HIGH (ref 70–99)
Glucose-Capillary: 165 mg/dL — ABNORMAL HIGH (ref 70–99)

## 2011-04-13 LAB — COMPREHENSIVE METABOLIC PANEL
ALT: 10 U/L (ref 0–35)
AST: 14 U/L (ref 0–37)
Alkaline Phosphatase: 64 U/L (ref 39–117)
CO2: 24 mEq/L (ref 19–32)
Chloride: 109 mEq/L (ref 96–112)
Creatinine, Ser: 1.14 mg/dL (ref 0.4–1.2)
GFR calc Af Amer: >60 mL/min (ref >60)
GFR calc non Af Amer: 52 mL/min — ABNORMAL LOW (ref >60)
Sodium: 140 mEq/L (ref 135–145)
Total Bilirubin: 0.6 mg/dL (ref 0.3–1.2)

## 2011-04-13 LAB — CBC
HCT: 27.8 % — ABNORMAL LOW (ref 36.0–46.0)
MCV: 96.4 fL (ref 78.0–100.0)
MCV: 95.7 fL (ref 78.0–100.0)
Platelets: 164 10*3/uL (ref 150–400)
RBC: 3.58 MIL/uL — ABNORMAL LOW (ref 3.87–5.11)
RDW: 13.7 % (ref 11.5–15.5)
WBC: 7.6 10*3/uL (ref 4.0–10.5)

## 2011-04-13 NOTE — Discharge Summary (Signed)
NAME:  [name] [name], [name] [name]               ACCOUNT NO.:  [id number]  MEDICAL RECORD NO.:  [id number]           PATIENT TYPE:  I  LOCATION:  6708                         FACILITY:  [facility]  PHYSICIAN:  Talbert Cage, M.D.DATE OF BIRTH:  [birth year]  DATE OF ADMISSION:  03/22/2011 DATE OF DISCHARGE:  03/26/2011                              DISCHARGE SUMMARY   DISCHARGE DIAGNOSES: 1. Nausea and vomiting resolved. 2. Cyclic vomiting syndrome. 3. Diabetes mellitus type 2. 4. Diabetic neuropathy. 5. Anemia. 6. Depression. 7. Anxiety. 8. Hypertension. 9. Gastroesophageal reflux disease. 10.Chronic back pain. 11.Insomnia.  DISCHARGE MEDICATIONS: 1. Clonidine 0.2 mg per 24-hour patch transdermally q. week. 2. Coenzyme Q 10 OTC p.o. daily. 3. L-carnitine OTC supplement p.o. daily. 4. Docusate 100 mg p.o. b.i.d. 5. Hydrocodone/acetaminophen 5/325 mg p.o. q.4 h. p.r.n. 6. Insulin glargine Lantus 10 units subcutaneously daily at bedtime. 7. Reglan 10 mg p.o. t.i.d. before meals. 8. Senna 1-2 tablets p.o. b.i.d. p.r.n. 9. Carafate 1 gram suspension p.o. before meals at bedtime. 10.Aspirin 81 mg p.o. daily. 11.Lorazepam 1 mg p.o. q. 6 h. p.r.n. 12.Coreg 25 mg p.o. b.i.d. 13.Flexeril 5 mg 1 tablet p.o. t.i.d. p.r.n. 14.Remeron 30 mg p.o. daily at bedtime. 15.NovoLog 6 units subcutaneously t.i.d. with meals. 16.Phenergan 25 mg p.o. q.4 h. p.r.n. 17.Prilosec 20 mg p.o. daily. 18.Zofran 4 mg p.o. q.4 h. p.r.n.  LABS AND PROCEDURES: 1. Hemoglobin at discharge 9.1. 2. Gastroccult positive. 3. Acute abdominal series showing no abdominal findings and normal gas     pattern.  BRIEF HOSPITAL COURSE:  This is a 45 year old female with a history of cyclic vomiting syndrome and uncontrolled diabetes mellitus who presents recurrently with uncontrolled nausea, vomiting, abdominal pain. 1. Nausea, vomiting.  The patient was recently discharged from [facility] with similar complaints.   Imaging and workup was     negative at that time for more insidious causes of her problem and     again her abdominal plain film, lipase and labs were negative for     intra-abdominal process.  The patient was managed     conservatively with a regimen of bowel rest, scheduled Phenergan,     p.r.n. Dilaudid for abdominal pain, scheduled Ativan 1mg  q. 8 hours,     Reglan 10 mg t.i.d., and IV hydration.  With her recurrent     episodes of this problem, it seems this regimen reliably improves her     symptoms gradually.  The patient can identify no triggers as to the     cause of this cyclic vomiting.  Her last gastric emptying study     several years ago did not show gastroparesis, however, given her     history of relatively uncontrolled diabetes it is likely this is     also playing a part in her increased symptoms.  Repeat study was     not obtained as this would not change her medical course or current management; however, this may     be considered in the future.  The patient was discharged     on medical management including Reglan, p.r.n. Phenergan  and     Zofran.  The patient was observed progressing from clears diet to     full solid prior to discharge.  Her abdominal pain had improved and     Dilaudid was transitioned to a short course of Percocet.     Additionally, we reviewed studies indicating supplementation with     carnitine and coenzyme Q 10 has been observed to reduce recurrence     of cyclic vomiting and these supplements were suggested at the time     of discharge.  Additionally, the patient was prescribed Carafate     for esophageal irritation from persistent vomiting as evidenced by     her positive Gastroccult.  She will also continue her home PPI. 2. Hypertension.  Notably, the patient suffers from severely     uncontrolled hypertension during these bouts of recurrent nausea,     vomiting.  Her systolic pressures were as high as 210/120 on     presentation.  She was  dosed repeatedly with IV labetalol and     hydralazine and started on clonidine patch for parenteral     antihypertensive treatment.  She responded well to this clonidine     patch and on restarting her home dose of Coreg, the patient's blood     pressure regained good control with values 130/80 prior to     discharge. 3. Diabetes.  The patient's blood sugars were difficult to control     given her inability to sustain consistent oral intake.  Her home     Lantus dose was decreased from 22 units to 10 units during this     time of decreased oral diet.  Her CBG range was 119-146 on this     reduced dose at the time of discharge.  She will be discharged with     instructions to use 10 units until she can follow up with Dr. Sherilyn Cooter     as an outpatient who may titrate this as necessary. 4. Anemia.  The patient's hemoglobin was monitored and on arrival was     12 and this declined to 9.1 with IV hydration.  Most likely this    was observed secondary to her restricted fluid status on     presentation, however, this may need to be monitored as an     outpatient.  She had no gross blood loss in her stool or vomit but     given her positive Gastroccult may need follow up with     outpatient endoscopy.  DISCHARGE INSTRUCTIONS:  The patient was discharged with instructions to increase activity slowly and eat bland foods and advance diet as tolerated.  She was counseled regarding keeping a food diary to identify any possible triggers.  She also noted that emotional and family stress may play a part in her triggering of her nausea and vomiting. We recommended to avoid this if possible.  She will call clinic or return to emergency care for worsening abdominal pain, nausea, vomiting, blood in stool or vomit or any other concerning symptoms.  FOLLOWUP APPOINTMENTS:  Dr. Mariana Arn at [facility] on April 07, 2011, at 11:20 a.m.  The patient was discharged to home in stable medical  condition.    ______________________________ Luis Abed, MD   ______________________________ Talbert Cage, M.D.    JK/MEDQ  D:  03/27/2011  T:  03/28/2011  Job:  [id number]  Electronically Signed by Luis Abed MD on 03/31/2011 08:57:01 PM Electronically Signed by Ruthann Cancer  Shuaib Corsino M.D. on 04/13/2011 02:22:35 PM

## 2011-04-14 ENCOUNTER — Encounter: Payer: Self-pay | Admitting: Family Medicine

## 2011-04-14 ENCOUNTER — Ambulatory Visit (INDEPENDENT_AMBULATORY_CARE_PROVIDER_SITE_OTHER): Payer: Medicare Other | Admitting: Family Medicine

## 2011-04-14 DIAGNOSIS — I10 Essential (primary) hypertension: Secondary | ICD-10-CM

## 2011-04-14 DIAGNOSIS — R1115 Cyclical vomiting syndrome unrelated to migraine: Secondary | ICD-10-CM

## 2011-04-15 NOTE — Assessment & Plan Note (Signed)
Continue current regimen. Will hold off on continuing clonidine patch and lisinopril as patient has not been using, and pressures are generally well controlled unless patient is having episode of pain and vomiting. Follow up 6 weeks.

## 2011-04-15 NOTE — Assessment & Plan Note (Signed)
Appears to be largely secondary to diabetic gastroparesis and GERD. Continue treatment as below. Will refer to [facility] for management, as patient unable to make it out to [facility] for re-evaluation. Advised to avoid triggers. Continue home regimen. Reviewed red flags that would prompt return to care.

## 2011-04-15 NOTE — Progress Notes (Signed)
  Subjective:    Patient ID: [name] [name], female    DOB: December 30, 1965, 45 y.o.   MRN: [id number]  HPI  1) Persistent vomiting: Hospitalized 03/22/11 - 03/26/11 with abdominal pain, nausea / emesis cyclic vomiting per her usual episodes of cyclic vomiting - returned for follow up 4/11 but at that time had worsening symptoms of abdominal pain, nausea, emesis - treated in clinic with resolution of symptoms, however symptoms progressed and patient was admitted. Seen by Dr. Benson Norway - symptoms were felt to be a manifestation of diabetic gastroparesis and esophagitis as opposed to cyclic emesis. Gastric emptying study during this hospitalization demonstrated moderate delay in   gastric emptying, though previous study was negative; EGD demonstrated small hiatal hernia and gastritis.  She responded well to IV fluids, Phenergan, Dilaudid and Ativan. She continues to smoke marijuana, though this has been identified as a positive trigger for her symptoms.  Plan was for patient to follow up at [facility] (with case worker assigned to arrange transport as this has been an issue in the past interms of preventing follow up), however patient has requested consultation by [facility] (has been seen by [facility] in the past).  2) Hypertension: Patient usually at goal unless having episode of emesis and unable to take medications and having severe pains. Started on lisinopril 5mg  during hospitalization (she had not filled this prescription) and clonidine patch in addition to her regular regimen. Blood pressure today at goal. Denies chest pain, headache, LE edema.   Pertinent history reviewed as above.   Review of Systems As per HPI     Objective:   Physical Exam General: well appearing, NAD  CV: RRR, no murmurs, no JVD, no LE edema Pulm: CTAB  Abd: S/NT/ND, +BS, no guarding or rebound,        Assessment & Plan:

## 2011-04-18 NOTE — Consult Note (Signed)
NAME:  [name] [name], [name] [name]               ACCOUNT NO.:  [id number]  MEDICAL RECORD NO.:  [id number]           PATIENT TYPE:  I  LOCATION:  [id number]                         FACILITY:  [facility]  PHYSICIAN:  Tory Emerald. Benson Norway, MD    DATE OF BIRTH:  Apr 05, 1966  DATE OF CONSULTATION:  04/09/2011 DATE OF DISCHARGE:                                CONSULTATION   REASON FOR CONSULTATION:  Nausea and vomiting.  This is an unassigned teaching service patient.  HISTORY OF PRESENT ILLNESS:  This is a 45 year old female with a past medical history of depression, anxiety, diabetic neuropathy, migraine headaches, and poorly controlled diabetes who presents to the hospital with chronic nausea and vomiting.  The patient states that her nausea and vomiting has been ongoing for 9 years and she had a recurrence at home.  In fact since beginning of October last year, she has been in and out of the hospital on multiple occasions.  She reports having a 33-month period where she had no issues with the nausea and vomiting and she does not know why there was a brief period of respite from her chronic issues.  In the past, the patient was evaluated by [facility] in the office, however, reports that she previously had reflux esophagitis. There was also a prior history of gastroparesis.  Dr. Lajoyce Corners did perform an EGD in 2008 with negative findings and a gastric emptying scan at that time was negative for gastroparesis.  However, there was the report in the [facility] note that the patient did have gastroparesis in the past. At this time, her symptoms are essentially unchanged and she does use marijuana to help improve her symptoms.  PAST MEDICAL HISTORY:  As stated above.  PAST SURGICAL HISTORY:  Status post cholecystectomy, status post abdominal hysterectomy, status post laparotomy and lysis of adhesions 2003 secondary to small bowel obstruction.  FAMILY HISTORY:  Noncontributory.  SOCIAL HISTORY:  Significant for tobacco  and marijuana and negative for alcohol.  Status post cholecystectomy, status post abdominal hysterectomy, status post laparotomy and lysis of adhesions 2003 secondary to small bowel obstruction.  ALLERGIES:  AZITHROMYCIN.  MEDICATIONS: 1. Carvedilol 25 mg p.o. b.i.d. 2. Sliding-scale insulin. 3. Metoprolol 5 mg IV q.6 h 4. Remeron 30 mg p.o. nightly. 5. Flexeril 10 mg p.o. t.i.d. p.r.n. 6. Morphine 2 mg IV q.6 h. p.r.n. 7. Zofran 8 mg IV q.6 h. p.r.n. 8. Phenergan 25 mg IV q.6 h. p.r.n.  PHYSICAL EXAMINATION:  VITAL SIGNS:  Blood pressure is 163/98, heart rate is 90, respirations 20, and temperature is 97.1. GENERAL:  The patient is in no acute distress, alert and oriented. HEENT:  Normocephalic and atraumatic.  Extraocular muscles are intact. NECK:  Supple.  No lymphadenopathy. LUNGS:  Clear to auscultation bilaterally. CARDIOVASCULAR:  Regular rate and rhythm. ABDOMEN:  Moderately obese, soft, tender diffusely.  No rebound or rigidity.  Positive bowel sounds. EXTREMITIES:  No clubbing, cyanosis, or edema.  LABORATORY VALUES:  White blood cell count is 10.8, hemoglobin 10.3, MCV is 91.5, and platelets are 242.  Sodium 137, potassium 3.1, chloride 101, CO2 of 30, glucose 102, BUN 20,  and creatinine 1.8.  Hemoglobin A1c is 9.5.  IMPRESSION: 1. Chronic nausea and vomiting. 2. Poorly controlled diabetes. 3. History of gastroparesis. 4. History of reflux esophagitis.  At this point, I would not label the patient as having cyclic vomiting issues.  It is apparent that this is a chronic issue for her, although she did have a period where she had no vomiting for approximately 4 months.  Looking through the records, it has an ongoing since at least 2005.  The esophagitis can give these type of symptoms and she was previously diagnosed with this issue and since this has been a quite some time since her last EGD I think it would be reasonable to evaluate the patient for this  issue.  I do believe that the patient's symptoms stem from gastroparesis and she has a poorly controlled diabetes and also manifests diabetic neuropathy issues.  PLAN: 1. At this time for EGD tomorrow. 2. Await for gastric emptying scan. 3. Continue to supportive treatment at this time.     Tory Emerald Benson Norway, MD     PDH/MEDQ  D:  04/09/2011  T:  04/10/2011  Job:  [id number]  Electronically Signed by Carol Ada MD on 04/18/2011 08:54:12 PM

## 2011-04-23 ENCOUNTER — Observation Stay (HOSPITAL_COMMUNITY)
Admission: EM | Admit: 2011-04-23 | Discharge: 2011-04-24 | Disposition: A | Discharge status: Home / Self Care | Payer: Medicare Other | Attending: Emergency Medicine | Admitting: Emergency Medicine

## 2011-04-23 ENCOUNTER — Emergency Department (HOSPITAL_COMMUNITY): Payer: Medicare Other

## 2011-04-23 DIAGNOSIS — E119 Type 2 diabetes mellitus without complications: Secondary | ICD-10-CM | POA: Insufficient documentation

## 2011-04-23 DIAGNOSIS — I10 Essential (primary) hypertension: Secondary | ICD-10-CM | POA: Insufficient documentation

## 2011-04-23 DIAGNOSIS — R109 Unspecified abdominal pain: Secondary | ICD-10-CM | POA: Insufficient documentation

## 2011-04-23 DIAGNOSIS — R1115 Cyclical vomiting syndrome unrelated to migraine: Principal | ICD-10-CM | POA: Insufficient documentation

## 2011-04-23 LAB — COMPREHENSIVE METABOLIC PANEL
ALT: 8 U/L (ref 0–35)
BUN: 19 mg/dL (ref 6–23)
Calcium: 9.7 mg/dL (ref 8.4–10.5)
Creatinine, Ser: 1.78 mg/dL — ABNORMAL HIGH (ref 0.4–1.2)
GFR calc non Af Amer: 31 mL/min — ABNORMAL LOW (ref >60)
Glucose, Bld: 350 mg/dL — ABNORMAL HIGH (ref 70–99)
Sodium: 132 mEq/L — ABNORMAL LOW (ref 135–145)
Total Protein: 8.1 g/dL (ref 6.0–8.3)

## 2011-04-23 LAB — URINE MICROSCOPIC-ADD ON

## 2011-04-23 LAB — CBC
HCT: 37.5 % (ref 36.0–46.0)
MCV: 89.7 fL (ref 78.0–100.0)
RBC: 4.18 MIL/uL (ref 3.87–5.11)
WBC: 8.1 10*3/uL (ref 4.0–10.5)

## 2011-04-23 LAB — LIPASE, BLOOD: Lipase: 32 U/L (ref 11–59)

## 2011-04-23 LAB — URINALYSIS, ROUTINE W REFLEX MICROSCOPIC
Leukocytes, UA: NEGATIVE
Specific Gravity, Urine: 1.017 (ref 1.005–1.030)
Urobilinogen, UA: 0.2 mg/dL (ref 0.0–1.0)

## 2011-04-23 LAB — DIFFERENTIAL
Basophils Absolute: 0 10*3/uL (ref 0.0–0.1)
Lymphocytes Relative: 24 % (ref 12–46)
Lymphs Abs: 2 10*3/uL (ref 0.7–4.0)
Neutro Abs: 5.5 10*3/uL (ref 1.7–7.7)
Neutrophils Relative %: 68 % (ref 43–77)

## 2011-04-26 ENCOUNTER — Telehealth: Payer: Self-pay | Admitting: Family Medicine

## 2011-04-26 DIAGNOSIS — R1115 Cyclical vomiting syndrome unrelated to migraine: Secondary | ICD-10-CM

## 2011-04-26 NOTE — H&P (Signed)
NAME:  [name] [name], [name] [name]               ACCOUNT NO.:  [id number]  MEDICAL RECORD NO.:  [id number]           PATIENT TYPE:  I  LOCATION:  [id number]                         FACILITY:  [facility]  PHYSICIAN:  Worth A. Walker Kehr, M.D.    DATE OF BIRTH:  Dec 13, 1966  DATE OF ADMISSION:  04/08/2011 DATE OF DISCHARGE:                             HISTORY & PHYSICAL   PRIMARY CARE PROVIDER:  Dr. Royce Macadamia at [facility].  CHIEF COMPLAINT:  Nausea and vomiting.  [name] [name] is a 45 year old female patient well know to the [facility] team with a history of cyclic vomiting who presents today with 2-day history of nausea and vomiting.  Episodes started yesterday.  She was seen at [facility] yesterday and given injection of morphine as well as ejection of Phenergan.  The patient states these medications helped initially, but as they wore off, she became nauseous again and vomited enough to fill "half trash can full" last night.  Describes vomit as nonbloody and nonbilious.  Does have a history of blood and vomit on prior admits.  Also, admits to sharp burning epigastric and chest pain.  Unable to tolerate any p.o. medications at this time.  Did attempt PR Phenergan but no relief.  Vomiting persisted 01-Apr-2023. Returned to clinic with the same, but left to go to the emergency department before she was seen by a physician.  Endorses chest pain without palpitations.  No shortness of breath.  No change in bowel habits.  She is unable tolerate p.o. intake since yesterday.  PAST MEDICAL HISTORY: 1. Diabetes mellitus type 2. 2. Diabetic neuropathy. 3. Unspecified anemia. 4. Depression, anxiety. 5. Tobacco dependence. 6. History of migraines. 7. Hypertension. 8. GERD. 9. History of multiple admissions for cyclic vomiting. 10.History of pancreatitis. 11.Chronic back pain. 12.Insomnia.  SURGICAL HISTORY: 1. Cholecystectomy. 2. Total abdominal hysterectomy, right salpingo-oophorectomy  secondary     to ovarian abscess. 3. Exploratory laparotomy, lysis of adhesions secondary to small-bowel     obstruction.  FAMILY HISTORY:  The patient has diabetes in first few relatives. Mother died of MI in 2003/04/01.  Siblings with hypertension.  No other family members with history of cyclic vomiting or migraines.  SOCIAL HISTORY:  The patient is single.  She lives alone.  She has a tenth grade education and worked for [employer] in the most recent past, but currently is not working.  History of marijuana use which we feel is related to her cyclic vomiting.  Also, history of tobacco abuse.  Smokes one-pack every 2-3 days.  Denies any alcohol use.  ALLERGIES:  AZITHROMYCIN which causes nausea.  MEDICATIONS: 1. Norvasc 10 mg p.o. daily. 2. Aspirin 81 mg p.o. daily. 3. Clonidine 0.2 mg per 24-hour patch transdermally each week. 4. CoQ10 OTC p.o. daily. 5. Carnitine OTC p.o. daily. 6. Docusate 100 mg p.o. b.i.d. 7. Hydrocodone/acetaminophen 5/325 mg p.o. q.4 h. p.r.n. 8. Lantus 10 units subcu daily at bedtime.9. Reglan 10 mg p.o. t.i.d. prior to meals. 10.Senna 1-2 tabs p.o. b.i.d. p.r.n. 11.Carafate 1 g suspension p.o. before meals at bedtime. 12.Aspirin 81 mg p.o. daily. 13.Lorazepam 1 mg p.o.  q.6 h. p.r.n. 14.Coreg 25 mg p.o. b.i.d. 15.Flexeril 5 mg p.o. t.i.d. p.r.n. 16.Remeron 30 mg p.o. at bedtime. 17.NovoLog subcu t.i.d. 18.Phenergan 25 mg p.o. q.4 h. p.r.n. 19.Prilosec 20 mg p.o. daily. 20.Zofran 4 mg p.o. q.4 h. p.r.n.  LABORATORY STUDIES:  Sodium was 131, potassium 3.3, chloride 84, CO2 of 30, BUN 25, creatinine 2.21, glucose 490.  CBC showed WBC 13.5, hemoglobin 12.2, hematocrit 35.1, MCV 90.2, platelets 307.  UA showed pH of 7, glucose greater than 1000, ketones 15, moderate blood, proteins greater than 300.  Nitrites, leukocytes both negative.  Lipase is 34. Cardiac enzymes:  Myoglobin 312, CK-MB 1.2, troponin less than 0.05. Abdominal x-ray:  No active  cardiopulmonary disease in one view.  No acute or specific abdominal findings.  ASSESSMENT/PLAN:  Ms. Ro is a 45 year old female presenting with recurrent episodes of nausea, vomiting with inability tolerate p.o. intake as per her usual cyclic vomiting admissions. 1. Cyclic vomiting as above.  Given Ativan, Zofran, Dilaudid,     Phenergan in the emergency department with minimal improvement.     This vomiting is nonbloody and nonbilious.  Plan to     admit the patient with usual Dilaudid, Zofran, and Phenergan.  She     has been on Reglan in the past with some improvement.  Plan to     restart this once the patient is taking p.o. medications again.     Dilaudid for epigastric pain, low dose, and plan to switch to     morphine and p.o. meds as soon as possible.  We will continue IV Protonix     as the patient does have history of bloody vomitus in past.     The patient does have history of cyclic vomiting triggered     by marijuana use and denies currently, but UDS did show positive     THC in ED.  Unclear etiology of this most recent episode of vomiting.  No     clear signs of infection.  On record review the patient has not     been seen by GI in quite some time.  We discussed with team in the     morning for possible GI consult.  Of note, the patient does have     anion gap, see below.  We will plan to cycle her cardiac enzymes     and EKG in the morning.  Last gastroparesis study was 2008 actually     which showed normal emptying. 2. Diabetes mellitus, insulin dependent.  The patient states she has     been taking medications, but she has hyperglycemia with anion gap     of 17.  Bicarb is 30, likely contraction metabolic alkalosis,     possibly mixed disorder.  Could check ABG, but plan to defer     this at this time as the patient is not in extremis, it will not     change our current management.  We will check serum ketones.  We     will start Lantus 10 units at bedtime as she  takes this at home,     place her on sliding scale.  Continue IV fluids with repletion and     maintenance dosing.  Check serum ketones as above though it is     doubtful these will be elevated. 3. Hypertension, hypertensive emergency as blood pressures are greater     than 200/100 in the ED.  Frequently encountered this on her  admissions.  Corrects with IV medications.  The patient is placed     on weekly clonidine patch last admission but she is not currently     wearing patch now, so this could be possibly be rebound     hypertension secondary to vomiting. 4. Leukocytosis.  WBC was greater than 13 on admission today.  On     review, she does not have history of elevated WBCs even when she     first presented in the emergency department in the past.  Again,     this is likely secondary to stress.  No clear evidence of infection     by chest x-ray, UA, history and physical.  The patient does endorse     sore throat x1 day prior to nausea and vomiting.  States it feels     like her "tonsils are swollen" on the left side.  Question if I     palpated goiter.  We will check TSH. 5. Acute renal failure history, but creatinine grossly elevated today.     We will check a spot protein-creatinine ratio as she     showed greater than 300 proteins on her urinalysis but also blood     present.  Repeat UA in the morning.  Likely secondary to prerenal     dehydration. 6. Fluids, electrolytes, nutrition, and gastrointestinal.  N.p.o.     Continue IV fluids to 225 mL/hour per. 7. Prophylaxis.  Heparin prophylaxis.  DISPOSITION:  Home if condition permits.     [name], MD   ______________________________ Arty Baumgartner Walker Kehr, M.D.    JW/MEDQ  D:  04/09/2011  T:  04/09/2011  Job:  [id number]  Electronically Signed by [name]  on 04/20/2011 02:46:39 PM Electronically Signed by Candelaria Celeste M.D. on 04/26/2011 04:39:17 AM

## 2011-04-26 NOTE — Telephone Encounter (Signed)
Checking status of referral to the GI doctor.

## 2011-04-26 NOTE — Telephone Encounter (Signed)
No referral in system.  Will forward to MD.

## 2011-04-27 ENCOUNTER — Telehealth: Payer: Self-pay | Admitting: Family Medicine

## 2011-04-27 NOTE — Telephone Encounter (Signed)
Referral placed. Letter written. Office visits, study results, and hospitalizations printed. To clinical staff.

## 2011-04-27 NOTE — Telephone Encounter (Signed)
[name] [name] is doing home visit with pt. Says pt has spells of nausea/vomitting which causes her bp to rise to 180s/90s to 100/s. Pt was told when she has these spells she can use clonidine patch but her medicare plan does not cover the patch. [name] [name] wants to know if using the clonidine pill would have the same benefits? If so, pts medicare plan does cover the pill. Pt is nauseous today so could probably use the pills. You may reach [place] back at above number or call pt.

## 2011-04-27 NOTE — Telephone Encounter (Signed)
Discussed with Tonji regarding need for clonidine. BP was controlled at last visit and decision was made to not continue clonidine patch. No blood pressures were checked at Tonji's visit earlier today. Plan for Tonji to have home visit next week as well.

## 2011-04-27 NOTE — Telephone Encounter (Signed)
See referral for details.

## 2011-05-11 NOTE — H&P (Signed)
NAME:  [name] [name], [name] [name]               ACCOUNT NO.:  [id number]   MEDICAL RECORD NO.:  [id number]          PATIENT TYPE:  INP   LOCATION:  5501                         FACILITY:  [facility]   PHYSICIAN:  Blane Ohara McDiarmid, M.D.DATE OF BIRTH:  07-07-1966   DATE OF ADMISSION:  07/09/2008  DATE OF DISCHARGE:                              HISTORY & PHYSICAL   PRIMARY CARE PHYSICIAN:  Dr. [name], M.D.   CHIEF COMPLAINT:  Nausea and vomiting.   HISTORY OF PRESENT ILLNESS:  This is a 45 year old female who presents  with complaint of abdominal pain, nausea and vomiting times 2 days.  The  patient has cyclic vomiting syndrome and has been seen in the ED and  admitted many times for this problem.  Two days ago, she thought that  she had a kidney infection and went to the ED.  She was told that she  did not have a kidney infection and was given Dilaudid for pain.  She  states that the Dilaudid has made her nauseous and she began vomiting  soon after taking Dilaudid.  The last episode of vomiting was 2 hours  ago.  She has been vomiting about 2 teaspoon of clear fluid and states  that the last time she vomited 2 hours ago, she noticed that there was  some blood in the vomitus.  She denies fevers or dysuria.  She has been  taking Phenergan every 4 hours, but it did not help her nausea.  She  also took Ativan 1 mg this morning and that did help her to fall asleep.   PAST MEDICAL HISTORY:  1. Cyclic vomiting syndrome.  2. GERD.  3. Hematemesis.  4. Unspecified anemia.  5. Persistent vomiting.  6. Neuropathy, diabetic.  7. Migraine.  8. Hypertension.  9. Diabetes.  10.Depression.  11.Anxiety.   CURRENT MEDICATIONS:  1. Aspirin 81 mg p.o. daily.  2. Metformin 850 mg p.o. b.i.d.  3. Prilosec 20 mg p.o. daily.  4. Reglan 5 mg p.o. q.6 h.  5. Lorazepam 1 mg p.o. q.8 h. at first sign of nausea.  6. Promethazine 25 mg p.o. q.4 h. at first sign of nausea.  7. Topamax 50 mg p.o. b.i.d.  8.  Coreg 12.5 mg p.o. b.i.d.  9. Lantus 12 units daily subcu.  10.MiraLax powder.  11.Phenergan suppository one every 6 hours as needed.  12.Lisinopril 20 mg p.o. daily.  13.Phenergan injection 25 mg IM q.6 h. as needed.  14.Vicodin 5/500 mg p.o. q.4-6 h. as needed for pain.  15.Flexeril 10 mg p.o. daily for muscle spasm.   ALLERGIES:  PERCOCET, which causes her to itch; ERYTHROMYCIN-based  antibiotic, which causes nausea and vomiting; and DILAUDID, which causes  nausea and vomiting.   PAST SURGICAL HISTORY:  Cholecystectomy March 31, 2001, total abdominal  hysterectomy 03-31-02.  The patient had 3 C-sections.   FAMILY HISTORY:  Diabetes first degree.  Mother died of MI in Apr 01, 2003.   SOCIAL HISTORY:  She has 4 children, twin sons with no daughter, all in  their 34s.  She has a tenth grade education.  Denies alcohol or drug  use.  She smokes about a pack every 3 days.   REVIEW OF SYSTEMS:  Please see HPI.   PHYSICAL EXAMINATION:  GENERAL:  The patient is in distress and very  anxious.  She is not comfortable in the bed, but is appropriate during  the exam.  VITAL SIGNS:  Temperature 99.5, heart rate 78, blood pressure 176/120  and then taken again was 132/93, respiratory rate 20, and O2 sat 100% on  room air.  HEAD:  Normocephalic and atraumatic without obvious abnormalities.  MOUTH:  Oral mucosa and oropharynx without lesions or exudates.  Teeth  in adequate repair.  LUNGS:  Clear to auscultation.  No crackles or wheezes.  HEART:  Normal rate and regular rhythm.  No murmur, click, or rubs.  ABDOMEN:  Mild upper abdominal pain to palpation, otherwise within  normal limits.  Normal bowel sounds.  No distention.  No guarding.  No  rigidity.  No rebound tenderness.   LABORATORY DATA:  Sodium 140, potassium 3.1, chloride 93, bicarb 30, BUN  34, and creatinine 1.85.  At last discharge creatinine was 1.12 and  calcium 9.9.  Albumin 4.7, total protein 7.8, AST 19, ALT 16, alkaline  phosphatase 92,  total bilirubin 1.2, and lipase 17.  CBC with white  blood cell 14.8, ANC 11.3, hemoglobin 14.4, hematocrit 41, and platelet  279.   ASSESSMENT AND PLAN:  This is a 45 year old female with:  1. Nausea and vomiting, likely due to the cyclic vomiting syndrome as      the symptoms and signs are consistent with previous admissions for      the same.  Potassium today is 3.1 and the patient is somewhat able      to tolerate p.o. feeds.  She took a tablet of K-Dur 40 mEq earlier      in the ED and has been able to hold it down.  We will admit the      patient and give her fluid diet, start normal saline at 125 mL per      hour.  We will also start the usual inpatient regimen of Zofran 8      mg IV q.4 hours as needed, Phenergan 12.5 mg every 4 hour as      needed, Reglan 5 mg IV q.6 h. as needed, Ativan 1 mg every 6 hours      scheduled, and morphine IV for pain.  We will start Protonix 40 IV      b.i.d. as well.  Strict in's and out's.  2. Hypokalemia, most likely secondary to vomiting.  The patient was      given K-Dur 40 mEq by mouth and also given KCL 10 mEq IV over one      hour.  We will follow up BMET in morning.  3. Urinary tract infection.  Two days ago, her urinalysis was positive      for nitrites.  The patient is afebrile, but has had a white count      of 14.8 today.  No costovertebral angle tenderness.  We will treat      with Cipro 400 mg IV.  Urinalysis and panculture ordered.  4. Diabetes.  The patient also on sliding-scale insulin sensitive      protocol due to creatinine of 1.85.  Increasing creatinine may be      secondary to volume depletion from decreased p.o. intake and      vomiting.  The patient put back on metformin 800 mg  p.o. twice      daily.  BMET in morning and we will look up her most recent A1C      from clinic note.  5. Hypertension.  Blood pressure is stable.  We will continue on      Coreg.  6. Migraine.  Migraines may be contributing factor to the cyclic       vomiting syndrome.  The patient to continue on Topamax for migraine      prophylaxis and has morphine on board.  7. Abdominal pain, most likely secondary to vomiting since lipase is      normal at 17, and the patient has had many negative abdominal      workup.  The patient has Reglan, which should help with the      vomiting.  8. Fluid, electrolyte, and gastrointestinal.  The patient is on a      liquid diet and will be advanced as tolerated.  Electrolytes were      within normal limits except for potassium, which was 3.1.  The      patient was given K-Dur p.o. IV and we will follow BMET in the      morning.  9.  Prophylaxis, the patient is ambulating.   DISPOSITION:  Resolution of symptoms and potassium within normal limits  and follow up her other labs.      Merla Riches, MD  Electronically Signed      Blane Ohara McDiarmid, M.D.  Electronically Signed    CT/MEDQ  D:  07/10/2008  T:  07/10/2008  Job:  [id number]

## 2011-05-11 NOTE — Discharge Summary (Signed)
NAME:  [name] [name], [name] [name]               ACCOUNT NO.:  [id number]   MEDICAL RECORD NO.:  [id number]          PATIENT TYPE:  INP   LOCATION:  5501                         FACILITY:  [facility]   PHYSICIAN:  [name], M.D.  DATE OF BIRTH:  [birth year]   DATE OF ADMISSION:  05/05/2007  DATE OF DISCHARGE:  05/07/2007                               DISCHARGE SUMMARY   DISCHARGE DIAGNOSES:  1. Diabetes mellitus, type 2, with gastroparesis.  2. Diabetes mellitus with peripheral neuropathy.  3. Diabetes mellitus with nephropathy.  Baseline creatinine 0.92.  4. Hypertension.  5. Depression.  6. Smoker.  7. Migraine.  8. History of pyelonephritis.   PROCEDURES:  1. Ultrasound on May 9, which is negative.  2. Acute abdomen on May 9, which is also negative.   DISCHARGE MEDICATIONS:  1. Prilosec 20 mg 1 tab daily.  2. Reglan 10 mg before meals and at bedtime.  3. Phenergan 25 mg, half to 1 tab every 6 hours as needed.  4. Metformin 1000 mg in the morning, 500 mg in the PM.  5. Amlodipine 10 mg daily.  6. Lisinopril/hydrochlorothiazide 20/12.5 mg 1 tab daily.   HOSPITAL COURSE:  43, African-American female, with  history of diabetes mellitus and gastroparesis, that had a EGD back in  February, that showed only chronic gastritis.  No evidence of ulcer.  Presented to the ED with abdominal pain, nausea and vomiting, consistent  with previous episodes of gastroparesis.  She responded well to anti-  medics, NPO, pain medication, etc and was ready for discharge by May 07, 2007.   DIAGNOSES:  1. Gastroparesis/chronic gastritis.  Patient placed on Prilosec 20 mg      1 tab daily.  She will continue her Reglan 10 mg before meals and      at bedtime and Phenergan, as needed, for her vomiting.  At the time      of discharge, she was taking PO's well and had not had any nausea      or vomiting x24 hours.  2. Diabetes mellitus.  Patient's CBGs were fairly well controlled as  inpatient.  Probably will need a outpatient A1C.  3. Hypertension.  Patient will continue amlodipine 10 mg daily and      lisinopril/hydrochlorothiazide 20/12.5 mg 1 tab daily in the      outpatient setting.  Blood pressures were also reasonable in the      hospital.   FOLLOWUP:  Patient will call [facility] to arrange for  follow up with Dr. [name], primary care physician in the next 2 weeks  or so.      [name], M.D.     AL/MEDQ  D:  05/07/2007  T:  05/07/2007  Job:  [id number]

## 2011-05-11 NOTE — Discharge Summary (Signed)
NAME:  [name] [name], [name] [name]               ACCOUNT NO.:  [id number]   MEDICAL RECORD NO.:  [id number]          PATIENT TYPE:  INP   LOCATION:  5503                         FACILITY:  [facility]   PHYSICIAN:  Blane Ohara McDiarmid, M.D.DATE OF BIRTH:  1966/09/12   DATE OF ADMISSION:  07/09/2008  DATE OF DISCHARGE:  07/12/2008                               DISCHARGE SUMMARY   PRIMARY CARE PHYSICIAN:  Dr. [name], M.D. at the [facility].   DISCHARGE DIAGNOSES:  1. Cyclic vomiting syndrome/abdominal migraines.  2. Gastroesophageal reflux disease.  3. Hypokalemia.  4. Diabetes type 2.  5. Hypertension.  6. Depression/anxiety.  7. Tobacco dependence.  8. Urinary tract infection.   DISCHARGE MEDICATIONS:  1. Ativan 1 mg by mouth every 6 hours at first sign of nausea.  2. Reglan 5 mg by mouth every 6 hours at first sign of nausea.  3. Coreg 12.5 mg by mouth twice daily.  4. Metformin 850 mg twice daily.  5. Aspirin 81 mg by mouth daily.  6. Lisinopril 20 mg by mouth once daily.  7. Bactrim 160/800 once by mouth twice daily times 3 days.  8. Lantus 12 units injected daily.  9. Omeprazole 40 mg once daily.  10.Phenergan 25 mg tablets by mouth every 4 hours at first sign of      nausea.   PROCEDURES:  1. CT of the abdomen: 1. No renal calculi or hydronephrosis. 2.      Cholecystectomy without acute process in the abdomen.  2. CT of pelvis:  Impression:  1.  No acute pelvic process.  2.      Probable hysterectomy.   LABS:  A urine cultured E-coli that was sensitive to Bactrim.   BRIEF HOSPITAL COURSE:  [name] [name] is a 45 year old woman with a past  medical history of cyclic vomiting syndrome, diabetes type 2, and  gastroesophageal reflux disease with multiple past hospitalizations for  these conditions who presented with a relapse of cyclic vomiting  syndrome  that was refractory to treatment in the emergency department.  1. Cyclic vomiting syndrome/abdominal migraines:   The patient had no      episodes of emesis during her admission.  She was given her regular      inpatient regimen of Zofran 8 mg IV q.4 h p.r.n., Phenergan 12.5 mg      q.4 hours p.r.n., Reglan 5 mg IV q.6 hours p.r.n., Ativan 1 mg q.6      hours scheduled and, Morphine IV for pain as well as regydration      throughout her stay.  2. Gastroesophageal reflux disease: She was given Protonix 40 IV      b.i.d.  3. Hypokalemia:  The patient was found to be hyperkalemic, most likely      secondary to vomiting.  She was given K-Dur 40 mg by mouth as well      as 10 mEq IV over 1 hour and this was sufficient to replace her      potassium.  4. Diabetes:  We continued her sliding scale throughout the  hospitalization.  5. Tobacco dependence:  Encouraged cessation and gave tobacco      cessation information.  6. Uncomplicated urinary tract infection:  The patient was found to      have a urinary tract infection from Escherichia coli that was      sensitive to Bactrim.  She was discharged home with a prescription      for Bactrim 160/800 p.o. b.i.d.  times 3 days.  7. Hypertension:  The patient's blood pressure was stable and we held      her hypertension medications throughout the stay.   DISCHARGE INSTRUCTIONS:  The patient was in a rush to leave the hospital  as she was trying to get to a family year reunion.  She declined for [place]  to make a discharge follow-up appointment for her.  She was given the  number to the family practice clinic and told to follow-up with her  primary care physician.   DISCHARGE CONDITION:  Stable.      Briscoe Deutscher, MD  Electronically Signed      Blane Ohara McDiarmid, M.D.  Electronically Signed    EW/MEDQ  D:  07/12/2008  T:  07/12/2008  Job:  [id number]   cc:   [name], M.D.

## 2011-05-11 NOTE — Discharge Summary (Signed)
NAME:  [name] [name], [name] [name]               ACCOUNT NO.:  [id number]   MEDICAL RECORD NO.:  [id number]          PATIENT TYPE:  INP   LOCATION:  6732                         FACILITY:  [facility]   PHYSICIAN:  Domingo Cocking. Jimmye Norman, M.D.DATE OF BIRTH:  [birth year]   DATE OF ADMISSION:  12/26/2007  DATE OF DISCHARGE:  12/29/2007                               DISCHARGE SUMMARY   DISCHARGE DIAGNOSES:  1. Cyclic vomiting syndrome with acute exacerbation of nausea,      vomiting and abdominal pain.  2. Urinary tract infection.  3. Dehydration.  4. Acute renal failure  5. Hypertension.  6. Diabetes mellitus  7. Depression/anxiety.   DISCHARGE MEDICATIONS:  1. Coreg 25 mg twice daily.  2. Metformin 1000 mg twice daily.  3. Lantus 8 units every night.  4. Prilosec 20 mg once daily.  5. Flexeril 10 mg twice daily.  6. Phenergan 25 mg q.4 h. as needed.  7. Ativan 1 mg every 4 hours as needed.  8. Nortriptyline 25 mg every evening.  9. Prozac 40 mg daily.  10.Topamax 50 mg twice daily.  11.The patient had her aspirin held secondary to anemia.   HOSPITAL COURSE:  A 45 year old African-American female who was admitted  on December 26, 2007 because of abdominal pain, nausea, vomiting,  dehydration.  She was treated per her usual course with her cyclic  vomiting syndrome with fluids and antiemetics.  She was back to a normal  diet, feeling much better and ready to go on December 29, 2007.  We also  checked an urinalysis that was not very impressive.  However, her urine  culture did grow E. coli, so she was started on Keflex 500 b.i.d. which  it was sensitive to.  We also increased her Prozac.  Lastly, she also  was noticed to have worsening anemia while in the hospital without a  specific source.  At the time of discharge, we got hemolysis and iron  deficiency labs to help determine the cause.  1. Cyclic vomiting syndrome:  This is a chronic issue for her.      Currently, we will  continue the outpatient  regimen of Phenergan      every 4 hours as needed; Ativan 1 mg every 4 hours as needed;      Topamax scheduled 50 mg twice daily for possible abdominal migraine      and Prilosec 20 mg daily.  2. Acute renal failure:  Her creatinine is actually fairly stable.  It      ranged from 1.1 throughout her whole hospital admission, although      this is slightly above her previous baseline of around 0.8-0.9.      She tolerated her fluids well.  3. Urinary tract infection:  She was treated with Keflex and will get      a 7 day course.  Cultures indicated sensitivity.  4. Anemia:  Last outpatient anemia was in the 10s.  Hemoglobin at      discharge was 8.9 in the morning, but done on recheck it was 9.4      which could have  been from dilution or blood draws.  Either way,      this is lower than it probably should be for her.  She is no longer      having periods as she has had a hysterectomy.  She has had a GI      scope in January 2008.  Her MCV is within normal limits.  Her RDW      is also normal.  At the time of discharge, we got a few labs      related to this including iron panel and hemolysis labs.  5. Diabetes mellitus:  She is stable, although hyperglycemic on      admission.  It was probably secondary to dehydration because blood      sugar stabilized without any real changes to her medical regimen.  6. Chronic abdominal pain:  The patient is on Prilosec 20 mg daily.      We also will check H. pylori.  The patient is also discharged on 30      of Vicodin.  7. Anxiety:  Unchanged except for increasing her Prozac to 40 mg      daily.  Continued nortriptyline 0.25 mg every evening.  8. Hypertension:  Again, we will avoid ACE inhibitors with her      frequent admissions with dehydration and acute renal failure.  Her      blood pressures seem reasonably controlled with Coreg 25 mg twice      daily.   FOLLOW UP:  The patient will follow up Dr. [name] on January 04, 2008  at 8:30 in the  morning.  At that time, consider rechecking CBC.  Also  would consider following up results of her anemia studies.   DISCHARGE DIET:  Low carb.   DISCHARGE ACTIVITY:  No restrictions.      [name], M.D.  Electronically Signed      Domingo Cocking. Jimmye Norman, M.D.  Electronically Signed    AL/MEDQ  D:  12/29/2007  T:  12/29/2007  Job:  [id number]

## 2011-05-11 NOTE — Discharge Summary (Signed)
NAME:  EZEKIEL, COUEY               ACCOUNT NO.:  [id number]   MEDICAL RECORD NO.:  [id number]          PATIENT TYPE:  INP   LOCATION:  5736                         FACILITY:  [facility]   PHYSICIAN:  [name], M.D.DATE OF BIRTH:  [birth year]   DATE OF ADMISSION:  06/07/2007  DATE OF DISCHARGE:  06/10/2007                               DISCHARGE SUMMARY   ADMISSION DIAGNOSES:  1. Nausea and vomiting.  2. Hypertension.  3. History of gastroparesis.  4. Type 2 diabetes.   DISCHARGE DIAGNOSES:  1. Urinary tract infections.  2. Gastroparesis.  3. Hypertension.  4. Diabetes.  5. Gastritis.   DISCHARGE MEDICATIONS:  1. Lisinopril/hydrochlorothiazide 20/12.5 mg one tablet p.o. daily.  2. Metformin 1000 mg p.o. q.a.m. , 500 mg every evening as taken      before.  3. Keflex 500 mg one tablet p.o. b.i.d. 3 days.  4. Senna two tablets p.o. q.h.s. p.r.n. constipation.  5. Vicodin one or two tablets p.o. q.6h. only if needed for pain.  The      patient was informed that this medication may not help and may      worsen constipation.  6. Prilosec OTC 20 mg p.o. b.i.d.  7. Phenergan 25 mg one-half to one tablet q.6h. p.r.n. nausea.   HOSPITAL COURSE:  This is a 45 year old female well-known to service,  who was admitted from clinic for nausea and vomiting, intractable.  She  has a history of gastroparesis and her signs and symptoms coincided with  an exacerbation of this.  She also describes blood in her vomit and was  even seem to have some blood in her vomit at clinic.  Please see the  following for hospital course.   Problem 1.  NAUSEA AND VOMITING:  Given her prior history, it is most  likely an exacerbation of gastroparesis.  We admitted her to the  hospital for IV fluids and began IV Reglan and Phenergan to help her.  The patient refused an NG tube.  With IV fluids alone as well as IV  Reglan and Phenergan, the patient's nausea and vomiting rapidly improved  and by the  following morning after discharge she no longer had the  vomiting.  She continued to have nausea.  A gastric emptying study was  ordered to help determine the patient's GI status.  The gastric emptying  study was actually normal.  Because of this normal study and the  patient's resolution of symptoms by day of discharge, we discontinued  the Reglan as the Reglan did not seem to be helping the patient and  instead may be causing some adverse effects.  The patient was okay with  stopping the Reglan and understands that she may speak with her primary  care physician, Dr. [name], if she chooses to resume this  medication.  The patient continued IV fluids until day #3.  Her  electrolytes remained within normal limits except for hypokalemia, which  we repleted with potassium.  This was indeed a gastroparesis  exacerbation with resolution on IV fluids and some bowel rest.  Please  note, the patient  did not have anything p.o. until the second day of  admission.   Problem 2.  HYPERTENSION:  The patient's blood pressure remained  elevated.  Initially we had held her p.o. medications and started her on  IV Lopressor.  Once she tolerated p.o. adequately we switched to her  home regimen of lisinopril and hydrochlorothiazide.  On day of discharge  the patient's systolics were still elevated in the 150s with an isolated  178.  However, the patient still is not able to afford her previous  medication of amlodipine and after discussion with the primary care  Dajuana Palen, we decided to continue on her home medications and further  discussion on other medicine will be decided at her outpatient  appointment in a couple of days.   Problem 3.  DIABETES:  Initially the patient was n.p.o. and on sliding  scale insulin.  However, once she tolerated a p.o. diet, we put her on  her home dose of metformin, which was 1000 mg every morning and 500 mg  every night.  Her CBGs then normalized.   Problem 4.   ELECTROLYTES AND NUTRITION:  The patient had hypokalemia on  day of discharge with a potassium if 3.1.  we repleted this 40 mg of  potassium chloride plus the patient is tolerating an adequate diet,  which should help this problem.  Otherwise, the patient was on a full  diet at the time of discharge with no nausea or vomiting or any other  symptoms.   Problem 5.  CONSTIPATION:  The patient complained of recent  constipation.  We started Senna two tablets p.o. q.h.s. to help this.  However, the patient did not have a bowel movement initially and  therefore we tried a Fleet's enema.  She did have a bowel movement after  this enema.  She was discharged on daily senna dose.   Problem 6.  GASTRITIS:  The patient had previously been diagnosed with a  gastritis on a prior EGD earlier this year.  We feel that some of the  abdominal pain the patient was having with the nausea and vomiting could  be attributed to the gastritis.  We therefore increased her home  Prilosec dose of 20 mg p.o. b.i.d.   FOLLOW-UP:  The patient follow up with Dr. [name] of [facility] as previously scheduled on June 13, 2007.   Follow-up issues are:  1. Blood pressure medications:  Are there cheap alternatives?  2. Pain medication:  Initially we attempted to not start this patient      on narcotics as it may slow for bowels down as well as she does not      need to be on them chronically.  However, the patient states Ultram      made symptoms worse and the gastritis that the patient has had      previously would be exacerbated by ibuprofen, and Tylenol does not      help.  Therefore, I wrote a prescription for 8 pills of Vicodin.  I      will leave it up to the primary care physician whether he wants to      continue this for not.  The primary care physician is aware of      this.   CONSULTATIONS:  None.   IMAGES:  1. Gastric emptying study, which was normal. 2. Abdominal x-ray showed  constipation.   PERTINENT LABS:  The patient's initial comprehensive metabolic panel was  within normal limits including a creatinine of 1.12, potassium  __________ , sodium of 139, bicarb of 29, BUN 14.  Please note on day of  discharge it was also within normal limits except for a potassium of  3.1.  Creatinine is 0.86 at this time.  CBC was within normal limits  except for a hemoglobin of 11.5, hematocrit 34.5.  Please note,  platelets normal at 254, white count 9.3.  A urine culture from an ED  visit on June 10 grew out E. coli greater than 100,000, sensitive to  cephalosporins.   Fecal occult blood positive.  Urinalysis showed small bilirubin,  positive ketones 15, large blood, greater than 300 protein, negative  nitrite, negative leukocytes, specific gravity was 1.02.  no other  pertinent labs.     ______________________________  [name], M.D.    ______________________________  Jamal Collin. Andria Frames, M.D.    JT/MEDQ  D:  06/10/2007  T:  06/11/2007  Job:  [id number]   cc:   [name], M.D.

## 2011-05-11 NOTE — Discharge Summary (Signed)
NAME:  [name] [name], [name] [name]               ACCOUNT NO.:  [id number]   MEDICAL RECORD NO.:  [id number]          PATIENT TYPE:  INP   LOCATION:  4705                         FACILITY:  [facility]   PHYSICIAN:  [name], M.D.   DATE OF BIRTH:  July 15, 1966   DATE OF ADMISSION:  06/18/2007  DATE OF DISCHARGE:  06/21/2007                               DISCHARGE SUMMARY   DISCHARGE DIAGNOSES:  1. Cyclical vomiting.  2. Diabetes.  3. Hypertension.  4. Depression.   DISCHARGE MEDICATIONS:  1. Metformin 1000 mg q.a.m. and 500 mg q.p.m.  2. Lisinopril/HCTZ 20/12.5 p.o. daily.  3. Vicodin one tab q.6 hours p.r.n. pain.  4. Senna two tabs p.o. q.h.s. p.r.n. constipation.  5. Prilosec OTC 20 mg p.o. b.i.d.  6. Phenergan suppositories 25 mg per rectum q.4-6 hours p.r.n.      vomiting and unable to take p.o. Phenergan.  7. Phenergan 25 mg p.o. q.4-6 hours p.r.n. vomiting.  8. Celexa 20 mg p.o. daily.   Patient was instructed to call the family practice center for a nurse  visit.  Please see above medications and suppositories if Phenergan do  not work.  They will give the patient either IV Phenergan or an  injection of Phenergan.  Patient was instructed to call the family  practice center for followup appointment with Dr. [name].   HOSPITAL COURSE:  This is a 45 year old female very well known to the  family practice service who was admitted for nausea and vomiting.  This  occurred very frequently for her and previously she had a diagnosis of  diabetic gastroparesis, however after normal gastric-emptying study we  feel that this is not the cause.  Instead, we feel that the diagnosis is  now cyclical vomiting.  Please see the following details:   1. Nausea, vomiting:  This is likely cyclical  vomiting, as gastric-      emptying study was within normal limits and there seems to be no      other known cause for the nausea and vomiting.  Radiographs and CTs      have shown no cause for this.  The  patient responds very well to IV      Phenergan and IV fluids.  We have used Reglan before, however when      the patient was not on Reglan, there seemed to be no difference.      Over the hospital course, we gave IV Phenergan as well as IV fluids      and some pain medicine to help control the patient's abdominal pain      that occurs with the vomiting.  She vomits enough to actually vomit      up some blood, however an EGD in February only showed gastritis and      we do not feel there are any other changes since that point.  Over      the hospital course, she gradually had decreased abdominal pain,      decreased nausea, decreased vomiting.  At time of discharge, we      discussed how to prevent  hospitalization, as the patient does not      like being hospitalized.  We came up with a plan that upon which if      the patient cannot tolerate p.o. or per rectal Phenergan, that she      would call the family practice center for medications to keep her      from entering the hospital.  The patient, again, does not have      diabetic gastroparesis and does not need Reglan at this point in      time.  2. Hypertension:  The patient's blood pressure was fairly well      controlled during this hospital stay with her home medications      lisinopril and HCTZ.  While she was NPO, we did use IV Lopressor      which also controlled her blood pressures.  No changes were made.  3. Diabetes:  We continued the patient on her home medicine of      metformin and her CBGs were fairly well controlled.  No changes      were made during this hospital stay.  A hemoglobin A1c was 6.6      which showed that the patient is fairly well controlled with her      diabetes.  4. Hematemesis:  Patient complained of blood in her vomit, was      actually noted to have blood in her vomit in clinic.  After initial      admission, CBC showed the patient was indeed anemic and there was a      mild trend down, her hemoglobin  stabilized and remained in the      upper 10s throughout hospital stay which is near the patient's      baseline.  We do not feel that this was an acute bleed.  5. Depression:  During the patient's hospital stay, the patient was      very tearful and actually stated that she felt depressed.  She was      very willing to start medications.  We started her on Celexa 20 mg      p.o. daily.  She will follow up with Dr. [name], her family      practice physician, to determine if this medication is the best for      her, it is on the [employer] brand, the patient said she had never      tried it before.  The patient denies any actions to harm herself.      She just states that she is tired of being hospitalized.  By day of      discharge, she was in improved spirits.  6. Pain control:  The patient comes in in severe pain and always      requests IV morphine.  No other medications seem to work for her.      We are attempting to not have this patient go on long-term      narcotics.  It is understood that with vomiting you can haven a lot      of pain, however this patient should not be a chronic narcotics      user.  Please attempt to use Tylenol for pain control initially in      attempt to limit narcotics in future hospitalizations.   PERTINENT IMAGES:  1. CT of the abdomen showed no acute findings status post      cholecystectomy.  2. Abdominal radiograph this stay  showed no acute abnormality.   PERTINENT RESULTS:  On admission, CBC showed white blood cells 8.4,  hemoglobin 10.9, hematocrit 32.3, platelets 332.  On day prior to  discharge, hemoglobin was 11.1, hematocrit 33.2.  Initial basic  metabolic panel:  Sodium Q000111Q, potassium 3.4, chloride 98, bicarb 28,  glucose 237, BUN 19 and creatinine 1.42.  On day prior to discharge,  sodium 138, potassium 4.2, chloride 104, bicarb 27, glucose 81, BUN 11, creatinine 0.9.  Urinalysis showed cloudy, negative nitrite, negative  esterase, small  amount of blood.  Comprehensive metabolic panel showed  liver enzymes to be normal, AST 16, ALT 15, albumin normal at 3.9, total  bili 0.7, alk phos 57, amylase 116 this is normal, lipase 15 this is  normal.  PT/INR 13 and 1, both normal.  Hemoglobin A1c 6.6.           ______________________________  [name], M.D.     JT/MEDQ  D:  06/27/2007  T:  06/27/2007  Job:  [id number]   cc:   [name], M.D.  Verner Chol, MD

## 2011-05-11 NOTE — H&P (Signed)
NAME:  [name] [name], [name] [name]               ACCOUNT NO.:  [id number]   MEDICAL RECORD NO.:  [id number]          PATIENT TYPE:  INP   LOCATION:  6729                         FACILITY:  [facility]   PHYSICIAN:  Karlton Lemon, M.D.    DATE OF BIRTH:  [birth year]   DATE OF ADMISSION:  05/09/2008  DATE OF DISCHARGE:                              HISTORY & PHYSICAL   PRIMARY CARE PHYSICIAN:  [name], MD, [name]   CHIEF COMPLAINT:  Vomiting.   HISTORY OF PRESENT ILLNESS:  This is a 45 year old female with history  of cyclic vomiting syndrome who presents with persistent emesis since  this morning at 2:00 a.m.  The patient states she had a similar  exacerbation 2 days ago, but was treated in the emergency department and  sent home, after which she felt better yesterday.  She states she has  had blood in her emesis after several episodes of emesis today, and this  was confirmed by gastric while in the emergency department.  She has not  been able to keep down her medicines today.  She has had sweats and  chills along with epigastric pain that usually accompanies her cyclic  vomiting syndrome exacerbations.  She denies chest pain, cough, dysuria,  diarrhea, and bloody stools.  She states this feels like her typical  exacerbations.   The patient was given Dilaudid 1 mg IV, Reglan 10 mg IV, Protonix 40 mg  IV, Phenergan 12.5 mg IV, as well as 750 mL of normal saline bolus in  the emergency department without relief of her symptoms.   PAST MEDICAL HISTORY:  1. Cyclic vomiting syndrome/abdominal migraines with multiple hospital      admissions.  2. Diabetes mellitus type 2.  3. Gastroesophageal reflux disease.  4. Anemia.  5. Chronic back pain.  6. Tobacco dependence.  7. Diabetic neuropathy.  8. History of migraines.  9. Hypertension.  10.Depression.  11.Anxiety.   PAST SURGICAL HISTORY:  Cholecystectomy in March 25, 2001.   ALLERGIES:  1. PERCOCET.  2. ERYTHROMYCIN.   MEDICATIONS:  1. Aspirin 81 mg daily.  2. Metformin 850 mg b.i.d.  3. Prilosec 20 mg daily.  4. Reglan 5 mg q.6 h.  5. Nortriptyline 1 tablet p.o. nightly.  6. Ativan 1 mg q.8 h. at the first sign of nausea.  7. Phenergan 25 mg 1 tablet every 4 hours at the first sign of nausea.  8. Topamax 50 mg b.i.d.  9. Coreg 12.5 mg b.i.d.  10.Prozac 20 mg daily.  11.Lantus 12 units daily.  12.MiraLax daily p.r.n.  13.Flexeril 5 mg t.i.d. p.r.n.  14.Dilaudid 4 mg one-half to one tablet p.o. q.4 h. p.r.n.  15.Lisinopril 20 mg daily.   FAMILY HISTORY:  Mother is deceased from an MI in 03/26/03.  Positive family  history for diabetes.   SOCIAL HISTORY:  The patient states that she pretty much lives alone.  She denies alcohol or drug use.  Denies remote marijuana use and smokes  1 pack every 3 days.   REVIEW OF SYSTEMS:  As per HPI.   PHYSICAL EXAMINATION:  VITAL SIGNS:  Temperature 97.4, pulse 108, blood  pressure lying down 200/114, pulse ox 100% on room air, and respirations  20.  GENERAL:  The patient appears ill, emesis basin at bedside was clear and  brown emesis.  HEENT:  Normocephalic and atraumatic, no conjunctival injection, no  nasal discharge, mucous membranes slightly dry.  LUNGS:  Clear to auscultation bilaterally.  No wheezes, rales or  rhonchi.  CARDIOVASCULAR:  Tachycardiac with a regular rhythm.  Faint systolic  murmur at the upper sternal borders, but no rubs or gallops.  ABDOMEN:  Diffusely tender, but worse at the epigastric region.  Tender  even to light touch.  Positive guarding, similar to my previous  examinations of her, without rebound.  Abdomen is soft without  hepatosplenomegaly and no masses are appreciated.  Bowel sounds are  hypoactive, and she has multiple scars from previous surgeries.  EXTREMITIES:  Warm and well perfused.  No skin tenting, and 2+ DP  pulses.   LABORATORY STUDIES:  White blood cell count 10.2, hemoglobin 12.7,  hematocrit 38.1, and  platelets 261.  Sodium 138, potassium 3.6, chloride  25, glucose 346, BUN 13, creatinine 1.12, calcium 10.0, protein 7.8,  albumin 4.3, AST 16, ALT 12, and lipase 63.  Urinalysis, specific  gravity 1.018 with glucose greater than 1000, small hemoglobin, 15  ketones, 100 protein, leukocyte esterase negative and nitrite negative.  There are many epithelials and many bacteria.   ASSESSMENT AND PLAN:  1. Hematemesis, likely due to cyclic vomiting syndrome, as the      symptoms and signs are consistent with previous admissions for the      same.  Hemoglobin stable at 12.7, and the patient is      hemodynamically stable.  We will admit the patient and make her      n.p.o., start normal saline 125 per hour after an additional normal      saline bolus of 500 mL.  We will start the usual inpatient regimen      of Zofran 8 mg IV q.4 h. as needed, Phenergan 12.5 mg every 4 hours      as needed, Reglan 5 mg IV q.6 h. scheduled, Ativan 1 mg every 6      hours scheduled, Dilaudid 1-2 mg every 4 hours as needed for pain.      We will start Protonix 40 IV b.i.d. as well.  Strict I's and O's.  2. Pancreatitis.  Mild elevation of lipase and liver panel is normal,      no alcohol use, and she is status post cholecystectomy.  We will      treat as noted in #1 above.  No need for an NG tube at this time.      White blood cell count is normal, AST normal, and calcium normal.  3. Gastroesophageal reflux disease.  We will use Protonix, and start      of her home Prilosec as an inpatient.  4. Anemia.  Hemoglobin was 12.5 but this may be increased with mild      dehydration from a persistent vomiting.  5. Hypertension.  We will hold meds while the patient is n.p.o., we      will give labetalol 20 mg IV q.4 h. p.r.n.  Standing blood pressure      greater than 180/110.  6. Diabetes mellitus type 2.  We will check CBGs q.4 h. with sensitive      sliding-scale insulin coverage while she is n.p.o.  7. Depression.  We are holding her medications as well, and we will      restart these when able.      Karlton Lemon, M.D.  Electronically Signed     SH/MEDQ  D:  05/10/2008  T:  05/10/2008  Job:  [id number]

## 2011-05-11 NOTE — Discharge Summary (Signed)
NAME:  ZYKERA, MROSS               ACCOUNT NO.:  [id number]   MEDICAL RECORD NO.:  [id number]          PATIENT TYPE:  INP   LOCATION:  6740                         FACILITY:  [facility]   PHYSICIAN:  Talbert Cage, M.D.DATE OF BIRTH:  [birth year]   DATE OF ADMISSION:  08/17/2007  DATE OF DISCHARGE:  08/20/2007                               DISCHARGE SUMMARY   PRIMARY DISCHARGE DIAGNOSIS:  Cyclical vomiting.   OTHER DISCHARGE DIAGNOSIS:  1. Hypertension.  2. Diabetes.  3. Depression.  4. Elevated troponins likely secondary to gastrointestinal issues.   DISCHARGE MEDICATIONS:  1. Bayer aspirin 81 mg p.o. daily.  2. Flexeril 10 mg p.o. q.h.s. or per home regimen.  3. Phenergan 25 mg p.o. every 6 hours p.r.n. nausea.  4. Metformin 800 mg 1 tab p.o. b.i.d.  5. Lisinopril/HCTZ 12.5 one pill p.o. daily.  6. Prilosec 40 mg p.o. b.i.d.  7. Vicodin 5/500 one tab p.o. every 4 hours p.r.n. pain, dispense #10.   Prescriptions were given for Vicodin, Prilosec, and Phenergan.  The  patient has all other prescriptions at home.   FOLLOWUP:  The patient will follow up with Dr. Melvern Banker at [facility] on August 30, 2007, at 11: 15 a.m.  The patient will also  follow up with Dr. [name] at [facility] as needed.  The patient will continue to go to family practice center for  intractable nausea and vomiting for IM Phenergan shots as needed.   PERTINENT IMAGES:  Chest x-ray:  No acute changes.  Abdominal x-ray  finds the patient otherwise unremarkable.   PROCEDURES:  Cardiac cath performed showed significant coronary artery  disease and normal left ventricular function with evidence of left  ventricular hypertrophy and mild mitral prolapse.   PERTINENT LABORATORY:  Troponin on admission elevated at 0.99, continued  to increase to 2.33 and 2.17.  On admission, hemoglobin 12.  On day of  discharge, hemoglobin 8.5.  Creatinine on admission 1.46.  Day of  discharge:   0.99.  Lipase normal at 23.  Blood cultures:  No growth to  date.  Urinalysis showed greater than 1000 glucose, greater than 300  proteins, and large amount of blood; otherwise not significant.  Urine  protein 110, urine creatinine 113.3.  Urine drug screen was positive for  opioids which were given in the hospital, as well as  tetrahydrocannabinol or marijuana.  The patient's initial CK-MB was 11.1  and myoglobin was greater than 500, and on repeat, CK-MB was 9.7 with  relative index of 2.3.   HOSPITAL COURSE:  This is a 45 year old female who comes in frequently  to our service for intractable nausea and vomiting, likely cyclical.  She was admitted for the same presentation from the family practice  clinic.  Because she has diabetes, we did get routine point of care  enzymes, which we had done several times.  However, during this  admission, it was noted that her initial point of care sets were  markedly elevated, which concerned [place] of having a possible end STEMI.  Please see following for hospital course details:  1.  Elevated troponins:  Due to the patient's significant comorbidities      of hypertension and diabetes, and the fact that she is female, the      bump in troponin was very concerning, especially with the nausea.      Although her EKG remained without ST changes, we did call      cardiology immediately.  They started the patient on aspirin,      heparin, and beta blocker for the routine protocol, and we are      going to take her to the cath lab first thing in the morning.  We      appreciate the cardiology's care by [facility], and the patient      was taken to cath on the morning of August 22.  Dr. Claiborne Billings performed      the cath, which essentially showed completely normal coronary      arteries with no sign of stenosis nor plaque and normal LV      function.  The cardiologist felt that the elevated troponins,      although not for certain, could be related to her severe  nausea and      vomiting.  She does have follow up with cardiology closely, on      September 3rd.  However, it was reassuring that her cardiac cath is      negative, and we can continue medical management with control of      her diabetes and blood pressure as well as lipid control.  The      patient did not have chest pain at all throughout this hospital      admission.  2. Nausea and vomiting:  Again, Ms. Mcfall presented with intractable      nausea and vomiting.  Once she arrived in the hospital, she      responded very well to IV Phenergan.  She also does have abdominal      pain and responds well to IV morphine as well as several other pain      medicines.  Eventually, her cyclical vomiting resolved.  In the      past, it was discussed that this patient may have gastroparesis;      however, she does not have gastroparesis as evidenced by her      previous gastric emptying study.  Instead, we think this patient      has likely psychogenic nausea and vomiting that is cyclical.  Her      primary care physician may need to more adequately control her      depression; however, this has been attempted in the past, and the      patient does not seem to be agreeable to many antidepressants.  We      will continue to treat symptomatically, and the patient was given      Phenergan prescription as well as may come to the clinic for the      nausea and vomiting.  We increased the patient's Prilosec regimen      to 40 mg p.o. b.i.d., which will eventually need to be decreased by      the primary care physician.  3. Hypertension:  Initially, the patient's hypertension medications      were held as she was n.p.o. due to her vomiting.  However, we have      resumed her home meds day of discharge.  The patient will follow up      at  clinic.  4. Diabetes:  Initially, we held the patient's metformin for elevated      creatinine as well as cardiac cath.  Her sugars continued to be      elevated but  were controlled with sliding scale insulin.  On day of      discharge, we resumed her home meds and metformin, and she will      follow up with primary care physician.   FOLLOWUP:  Through primary care physician.  If patient continues her  cyclical vomiting, some ideas are to try a different antidepressant, as  there was mention of this being psychogenic vomiting.  Otherwise, no  issues to be followed.  She is followed closely by cardiology.      [name], M.D.  Electronically Signed      Talbert Cage, M.D.  Electronically Signed   JT/MEDQ  D:  08/22/2007  T:  08/22/2007  Job:  [id number]   cc:   [name], M.D.  Talbert Cage, M.D.

## 2011-05-11 NOTE — H&P (Signed)
NAME:  [name] [name], [name] [name]               ACCOUNT NO.:  [id number]   MEDICAL RECORD NO.:  [id number]          PATIENT TYPE:  INP   LOCATION:  [place]                         FACILITY:  [facility]   PHYSICIAN:  Domingo Cocking. Jimmye Norman, M.D.DATE OF BIRTH:  [birth year]   DATE OF ADMISSION:  12/26/2007  DATE OF DISCHARGE:                              HISTORY & PHYSICAL   PCP:  [name] MD   Chief Complaint:  Vomiting.   History of Present Illness:  45 yo with history of cyclic vomiting syndrome presents to [facility] ED with  nausea and vomiting that has continued despite antiemetics in the ED.  She has been struggling with this current episode off and on now for 8  days.  First symptoms started last Monday and she was seen by Dr  [name] on 12/23.  In the clinic, she was given morphine and phenergan  which she usually responds to.  She did ok that night, but went to the  ED the next day who felt she was constipated and  treated her with  magnesium citrate which she didn't take until 12/27.  This incited a BM,  but was a little too harsh.   THe next day (Monday/yesterday)  She  woke up with worsened nausea and vomiting and was seen in clinic and  again given phenergan and morphine with some improvement.  She woke up  this AM with the same symptoms at about 0300 that didn't go away, so she  came to the ED.  IT is associated with diffuse crampy abdominal pain.  IT is the same symptoms that she typically has with similar admissions  averaging about once per month.   She denies urinary symptoms or vaginal discharge, or any other symptoms.   Current Allergies (reviewed today):  ! PERCOCET  ! ERYTHROMYCIN  Updated/Current Medications (including changes made in today's visit):  BAYER CHILDRENS ASPIRIN 81 MG CHEW (ASPIRIN) Take 1 tablet by mouth once  a day  METFORMIN HCL 850 MG TABS (METFORMIN HCL) Take 1 tablet by mouth twice a  day  PRILOSEC 20 MG CPDR (OMEPRAZOLE) Take 1 capsule by mouth once a day  REGLAN 5 MG TABS (METOCLOPRAMIDE HCL) Take 1 tablet by mouth every six  hours  NORTRIPTYLINE HCL 25 MG CAPS (NORTRIPTYLINE HCL)  LORAZEPAM 1 MG TABS (LORAZEPAM) Take 1 tablet every 8 hours at first  sign of nausea.  PROMETHAZINE HCL 25 MG  TABS (PROMETHAZINE HCL) Take 1 tablet every 4  hours at first sign of nausea  TOPAMAX 50 MG TABS (TOPIRAMATE) 1 by mouth bid  COREG 12.5 MG  TABS (CARVEDILOL) bid  PROZAC 20 MG CAPS (FLUOXETINE HCL) one tab by mouth daily  LANTUS SOLOSTAR 100 UNIT/ML  SOLN (INSULIN GLARGINE) 12 UNits daily.    Past Medical History:    Reviewed history from 06/13/2007 and no changes required:    Cylic Vomiting Syndrome/Abdominal migraines    diabetic foot ulcer-->Resolved    Normal Gastric Emptying study 6/08    L adnexal cyst (unchanged b/t 2 scans)    O+, Ab neg    Pyelo hospitalization in  10/04    recurrent UTIs - TOC culture neg    TSH, free T4 WNL 1/05    h/o recurrent hospital admits for vomiting, but normal gastric  emptying study 6/08.    Nonspecific gastritis on EGD 2/08    Has a Portacath 2o to freqent dehydration/N?V admits   Past Surgical History:    Reviewed history from 04/10/2007 and no changes required:    Abd CT - shotty LAD - 02/07/2004    Cholecystectomy, ex lap - 01/20/2001    Echo: EF 75%, mild septal hypertrophy, L vent wall thickness, mild  mitral regurg - 03/16/2006    EGD - esophagitis, gastroparesis - 12/28/2003    [id number] (3 TAB)    3 c/s - 01/20/2001    [place], loa - 05/07/2002    Family History:    Reviewed history from 02/23/2007 and no changes required:    Diabetes 1st degree, Mother died of MI in 04-14-23 - pt very sad about  this   Social History:    Reviewed history from 02/23/2007 and no changes required:    Monogamous; four children; twin sons 61, son 30, daughter 73.  Daughter lives with her.  She lives with a cousin; out of work; 10th  grade education; denies physical abuse; denies etoh or drugs (remote h/o  marijuana use)  smokes 1 pack q 3 days.  Brother died in winter when she  was 48 - she gets depressed every year in winter (?SAD).  Suicide  attempt winter (2003).     VS: 99.3,  111-128,  20-22, 170-220/99-115, 100% RA    Physical Exam   General:    Appears uncomfortable.  Lying on her stomach.  Winces with pain with  movement and clutches at her abdomen.  Alert and oriented.  Depressed  mood and affect.  Head:    Normocephalic and atraumatic without obvious abnormalities. No  apparent alopecia or balding.  Eyes:    Externally normal  Ears:    Externally normal  Nose:    Externally normal  Mouth:    Dry mucus membranes  Neck:    NO masses noted  Chest Wall:    Port-a-cath in place.  Lungs:    Normal respiratory effort, chest expands symmetrically. Lungs are  clear to auscultation, no crackles or wheezes.  Heart:    tachycardic.  Regular rhythm. S1 and S2 normal without gallop, murmur,  click, rub or other extra sounds.  Abdomen:    Mild diffuse abdominal pain with no rebound or guarding.  No HSM.  Normal active bowel sounds.  Pulses:    R and L carotid,radial,femoral,dorsalis pedis and posterior tibial  pulses are full and equal bilaterally  Extremities:    NO edema or cyanosis  Neurologic:    No cranial nerve deficits noted. Station and gait are normal. Plantar  reflexes are down-going bilaterally. DTRs are symmetrical throughout.  Sensory, motor and coordinative functions appear intact.  Skin:    Intact without suspicious lesions or rashes    Point of care Cardiac panel: Normal  BMP: normal except glucose of 319, and Ca 10.7  CBC: normal except WBC 11.1, ANC 8.6.  (hgb 12.7)   Impression & Recommendations:   Problem # 1:  NAUSEA WITH VOMITING (ICD-787.01)  Assessment: Deteriorated  This is her chronic cyclic vomiting symdrome/Abdominal migraine.  We  will treat symptomatically.  Continue Topamax for now although it  doesn't seem to be helping.  WIll treat with  phenergan and Reglan.  Will treat with morphine for her abdominal pain and schedule Tylenol.  Add Urinalysis and lipase and LFTs to blood in lab.  Orders:  Hospital Admit-[facility] (00000)    Problem # 2:  HYPERTENSION, BENIGN SYSTEMIC (ICD-401.1)  Assessment: Deteriorated  Bp elevated.  Lisinopril has been previously D/C'd secondary to her  multiple dehydration episodes.  I think her elevated BP here in the ED  is from the CVC/abdominal migraine.  Continue coreg.  Consider other  medications if needed although her bp usually improves as her symptoms  decrease.   Her updated medication list for this problem includes:    Coreg 12.5 Mg Tabs (Carvedilol) ..... Bid    Problem # 3:  DIABETES MELLITUS, II, COMPLICATIONS (A999333)  Assessment: Unchanged  Will hold metformin while here in the hospital.  SSI added while in the  hospital as well.   Her updated medication list for this problem includes:    Bayer Childrens Aspirin 81 Mg Chew (Aspirin) .Marland Kitchen... Take 1 tablet by  mouth once a day    Metformin Hcl 850 Mg Tabs (Metformin hcl) .Marland Kitchen... Take 1 tablet by mouth  twice a day    Lantus Solostar 100 Unit/ml Soln (Insulin glargine) .Marland Kitchen... 12 units  daily.    Problem # 4:  DEPRESSION, MAJOR, RECURRENT (ICD-296.30)  Assessment: Unchanged  On nortriptylene and Prozac.  Will increase Prozac to 40mg .   Problem # 5:  ANXIETY (ICD-300.00)  Assessment: Deteriorated  This probably contributes to her syndrome, so we will schedule ativan q  6 hours 1mg  IV for now.   Her updated medication list for this problem includes:    Nortriptyline Hcl 25 Mg Caps (Nortriptyline hcl)    Lorazepam 1 Mg Tabs (Lorazepam) .Marland Kitchen... Take 1 tablet every 8 hours at  first sign of nausea.    Prozac 20 Mg Caps (Fluoxetine hcl) ..... One tab by mouth daily    Complete Medication List:  1. Bayer Childrens Aspirin 81 Mg Chew (Aspirin) .... Take 1 tablet by      mouth once a day  2. Metformin Hcl 850 Mg Tabs (Metformin  hcl) .... Take 1 tablet by      mouth twice a day  3. Prilosec 20 Mg Cpdr (Omeprazole) .... Take 1 capsule by mouth once      a day  4. Reglan 5 Mg Tabs (Metoclopramide hcl) .... Take 1 tablet by mouth      every six hours  5. Nortriptyline Hcl 25 Mg Caps (Nortriptyline hcl)  6. Lorazepam 1 Mg Tabs (Lorazepam) .... Take 1 tablet every 8 hours at      first sign of nausea.  7. Promethazine Hcl 25 Mg Tabs (Promethazine hcl) .... Take 1 tablet      every 4 hours at first sign of nausea  8. Topamax 50 Mg Tabs (Topiramate) .Marland Kitchen.. 1 by mouth bid  9. Coreg 12.5 Mg Tabs (Carvedilol) .... Bid  10.Prozac 20 Mg Caps (Fluoxetine hcl) .... One tab by mouth daily  11.Lantus Solostar 100 Unit/ml Soln (Insulin glargine) .Marland Kitchen.. 12 units      daily.      [name], M.D.  Electronically Signed      Domingo Cocking. Jimmye Norman, M.D.  Electronically Signed   AL/MEDQ  D:  12/26/2007  T:  12/26/2007  Job:  [id number]

## 2011-05-11 NOTE — Discharge Summary (Signed)
NAME:  [name] [name], [name] [name] NO.:  [id number]   MEDICAL RECORD NO.:  [id number]          PATIENT TYPE:  INP   LOCATION:  [id number]                         FACILITY:  [facility]   PHYSICIAN:  Dalbert Mayotte, MD        DATE OF BIRTH:  09/16/1966   DATE OF ADMISSION:  07/23/2009  DATE OF DISCHARGE:  07/25/2009                               DISCHARGE SUMMARY   PRIMARY CARE PHYSICIAN:  Mariana Arn, MD at [facility].   ADMITTING DIAGNOSES:  Vomiting, abdominal pain.   DISCHARGE DIAGNOSES:  Vomiting, abdominal pain/persistent and  recurrent/cyclic emesis syndrome.   CONSULTANT:  None.   PROCEDURES:  None.   LABORATORY DATA:  1. CBC July 22, 2009, showing white count 14.2, hemoglobin 13.9,      hematocrit 41.2, platelet count 248.  2. Cardiac markers July 22, 2009, showing CK-MB of 1.3, troponin-I      less than 0.05, myoglobin of 257.  3. CMP July 22, 2009, showing a sodium of 136, potassium 3.5, chloride      95, CO2 27, BUN 53, creatinine of 2.74, glucose 187.  4. Blood lipase July 22, 2009, showing lipase of 47.  5. Urine pregnancy July 23, 2009, showing negative urine pregnancy      test.  6. Urinalysis, July 22, 2009, showing yellow color, turbid appearance,      specific gravity is 1.019, pH of 5.5, negative urine glucose,      negative bilirubin, 16 ketones, moderate blood, 100 protein,      urobilinogen of 1, negative nitrites, and trace leukocytes.  7. Microscopic add-on for urine July 22, 2009, showing many squamous      epithelial cells, 3-6 white blood cells per high-power field, 0-2      red blood cells per high-power field, and many bacteria.  8. CBC July 23, 2009, showing a white count of 9.6, hemoglobin of      12.2, hematocrit of 35.4, platelet count of 191.  9. BMET July 23, 2009, showing sodium of 137, potassium of 3.7,      chloride of 100, CO2 of 2.7, BUN of 46, creatinine of 2.17, glucose      of 165.  10.BMET July 24, 2009, showing  a sodium of 139, potassium of 3.3,      chloride of 106, CO2 of 26, BUN of 21, creatinine of 1.42, glucose      of 82.  11.BMET July 25, 2009, showing a sodium of 136, potassium 3.7,      chloride 109, CO2 of 23, BUN 9, creatinine of 1.15, glucose of 121.   HOSPITAL COURSE:  Briefly this is a 45 year old female with multiple  admissions for recurrent abdominal pain, nausea, and vomiting.  The  patient admitted with vomiting for 48 hours prior to admission.  The  patient was at home trying to relieve symptoms by use of home Phenergan  as well as Reglan, which did not relieve epigastric pain rated as 9/10.  1. Persistent vomiting.  The patient has had a chronic history of  recurrent emesis or a cyclic emesis type syndrome.  The patient is      on an established home regimen of Phenergan, Reglan, Ativan as well      as Dilaudid, which was continued on inpatient admission with the      addition of a proton pump inhibitor.  Given the patient's      epigastric pain as well as a questionable history of bloody emesis,      her hemoglobin was followed, which remained stable throughout the      hospitalization.  The patient was also hemodynamically stable      throughout the hospitalization.  The patient has recurrent history      of admissions for hyperemesis and usually the patient knows when      she feels good enough to go home and will be followed in the same      pattern in advancing the patient's diet as she tolerated as she      requested as well as discharging the patient when she felt that she      was ready to go home.  The patient's nausea moderately improved      throughout the hospitalization as well as the abdominal pain      moderately resolving to her relative baseline per the patient.  2. Diabetes mellitus.  The patient was placed on sliding scale insulin      while hospitalized without any reports of hyperglycemia or      hypoglycemia.  3. Hypertension.  The patient was on  Coreg throughout the      hospitalization with blood pressures remaining stable during the      hospitalization.  4. FEN/GI.  The patient was initially placed n.p.o. and diet was      advanced as the patient tolerated.  There were no reports of emesis      during the hospitalization.  The patient was also placed on normal      saline at 125 an hour during the hospitalization.  The patient      initially came in with an elevated creatinine, which has resolved      upon discharge with adequate fluid hydration.   DISCHARGE CONDITION:  Stable.   DISPOSITION:  The patient was discharged home in stable medical  condition.   MEDICATIONS:  1. Aspirin 81 mg p.o. daily.  2. Lorazepam 1 mg p.o. q.6 per nausea p.r.n.  3. Promethazine 25 mg 1 tablet p.o. q.4 h. at first sign of nausea.  4. Lantus 100 units/mL - 22 units daily.  5. MiraLax powder 1 packet with water p.r.n. for constipation.  6. Phenadoz 25 mg suppository insert 1 every 6 hours per rectum p.r.n.  7. Promethazine 25 mg/mL inject 25 mg IM q.6 p.r.n. for nausea.  IM      injection supplies quantity sufficient for 30 days.  8. Metformin 1000 mg 1 tablet p.o. b.i.d.  9. Vicodin 5/500, 1-2 tablets p.o. q.6 h. p.r.n. pain.  10.Coreg 25 mg 1 tablet p.o. b.i.d.  11.Imipramine 10 mg p.o. daily.  12.Prilosec 20 mg p.o. daily (this is a new medication at discharge).   FOLLOWUP:  The patient is to have a followup appointment with Mariana Arn, MD at the [facility] center in the next 1-2  weeks.  Please call for an appointment.   INSTRUCTIONS:  Please return to the ED for any uncontrollable nausea,  vomiting, abdominal pain, bloody emesis, bloody stools, chest pain,  shortness of breath, or any other distressing symptoms away from  baseline.      Shanda Howells, MD  Electronically Signed      Dalbert Mayotte, MD  Electronically Signed    SN/MEDQ  D:  08/04/2009  T:  08/05/2009  Job:  [id number]

## 2011-05-11 NOTE — Discharge Summary (Signed)
NAME:  [name] [name], [name] [name]               ACCOUNT NO.:  [id number]   MEDICAL RECORD NO.:  [id number]          PATIENT TYPE:  INP   LOCATION:  6742                         FACILITY:  [facility]   PHYSICIAN:  Domingo Cocking. Jimmye Norman, M.D.DATE OF BIRTH:  1966-01-05   DATE OF ADMISSION:  11/22/2007  DATE OF DISCHARGE:  11/28/2007                               DISCHARGE SUMMARY   PRIMARY CARE PHYSICIAN:  Dr. [name] of [facility].   DIAGNOSES:  1. Cyclic vomiting syndrome.  2. Hypertension.  3. Anxiety.  4. Diabetes mellitus type 2.  5. Anemia.  6. Dehydration.   DISCHARGE MEDICATIONS:  1. Phenergan 25 mg p.o. 4 hours as needed for nausea and vomiting.  2. Nortriptyline 25 mg p.o. at bedtime.  3. Aspirin 81 mg p.o. once daily.  4. Metformin 850 mg p.o. b.i.d.  5. Prilosec 20 mg p.o. daily.  6. Reglan 10 mg p.o. q.6 hours.  7. Fluoxetine 20 mg p.o. once in the morning, this is a new      medication.  8. Coreg 12.5 mg p.o. twice daily, this is a new medication.   CHANGES IN MEDICATION:  The patient was discontinued on her home dose of  hydrochlorothiazide/lisinopril.  The patient was also started and  stopped on methylphenidate while in the hospital.   CONSULTS:  None.   PROCEDURE:  None.   LABORATORY DATA:  Upon admission, her white blood cell count was 9.8,  hemoglobin 10.5, platelets 304, sodium 137, potassium 3.5, chloride 104,  bicarb 23, BUN 25, creatinine 1.82, AST was 14, ALT 12, alkaline  phosphatase 55, total bilirubin 0.8 and lipase of 19.  Upon discharge,  her sodium was 134, potassium 4.3, chloride 107, bicarb 24, BUN 6,  creatinine 1.21 and glucose 230.  White blood cell count of 6.6,  hemoglobin of 8.1 and a platelet of 229.   BRIEF HOSPITAL COURSE:  This is a 45 year old female with a history of  cyclic vomiting syndrome that was admitted for nausea, vomiting and  dehydration.   1. Nausea and vomiting.  Thought to be an exacerbation of her cyclic     vomiting syndrome.  She was placed on IV fluids for hydration      maintenance.  She was placed on antiemetics, scheduled Zofran, as      well as Phenergan and Ativan as needed.  She was given morphine for      pain.  The patient was placed on n.p.o. and rehydrated      aggressively.  She, after a course of 3 days, improved and      tolerated full liquids.  She was discharged in stable condition.      During this hospital course, she was started on initially      methylphenidate and was discontinued due to her elevated blood      pressures as a result.  2. Hypertension.  The patient's blood pressure were elevated at times.      She had her hydrochlorothiazide and lisinopril held during      hospitalization due to her elevated creatinine and  dehydration.      She was continued on her Lopressor and was eventually switched over      to her new dose of Coreg.  Blood pressures have been controlled      120s to 150s upon discharge.  She was discharged on 12.5 mg of      Coreg b.i.d. and discontinued on her Lopressor and HCTZ and      lisinopril.  3. Diabetes mellitus.  During hospitalization, she was controlled with      Lantus 18 units subcu daily.  She was discharged on her metformin.  4. Anxiety/depression.  The patient's nortriptyline and Prozac were      held during her hospitalization.  Upon discharge, she was started      back on her nortriptyline and started on her new dose of Prozac 20      mg p.o. once in the morning.   DISCHARGE INSTRUCTIONS:  She needs to advance her diet slowly, starting  with a bland diet.  She can return to work on November 30, 2007, there is  no restrictions to her activity.  She is going to follow up with Dr.  [name], her primary, on Monday, December 04, 2007 at 8:30.  She is to  call her primary doctor or return to the ER if nausea and vomiting is  not improved with home medications or if she has a temperature greater  than 101, not improved with Tylenol  in association with her nausea and  vomiting.   DISCHARGE CONDITION:  She is in stable condition.   FOLLOWUP:  With Dr. [name] Monday, December 04, 2007 at 8:30 a.m.      Dion Body, MD  Electronically Signed      Domingo Cocking. Jimmye Norman, M.D.  Electronically Signed    KL/MEDQ  D:  11/28/2007  T:  11/28/2007  Job:  [id number]   cc:   [name], M.D.

## 2011-05-11 NOTE — H&P (Signed)
NAME:  [name] [name], [name] [name]               ACCOUNT NO.:  [id number]   MEDICAL RECORD NO.:  [id number]          PATIENT TYPE:  INP   LOCATION:  6740                         FACILITY:  [facility]   PHYSICIAN:  Blane Ohara McDiarmid, M.D.DATE OF BIRTH:  Mar 22, 1966   DATE OF ADMISSION:  08/17/2007  DATE OF DISCHARGE:  08/20/2007                              HISTORY & PHYSICAL   PRIMARY CARE PHYSICIAN:  [name], M.D.   CHIEF COMPLAINT:  Nausea and vomiting x2 days.   HISTORY OF PRESENT ILLNESS:  The patient is a 45 year old female well-  known to me from inpatient admissions for gastroparesis who presents  with nausea and vomiting x2 days.   [name] [name] has had persistent nausea and vomiting yesterday morning.  This was occurring about every hour but since last night has been almost  continuous, she states.  Some streaks of blood in emesis.  Denies  diarrhea.  States she is constipated, but has had no bright red blood  per rectum or melena.  Last bowel movement was Sunday.  She has not  taken any medications the past 2 days because of this.  Came in  yesterday for Phenergan and morphine IV, which helped for a short time.  She has not taken any ibuprofen products.  Positive for abdominal pain  in the epigastric region.  She states it is similar to her previous  gastroparesis admissions.  She has had a normal gastric emptying study  in June and Reglan not noted to be of any help.  She is allergic to  ERYTHROMYCIN as well.   MEDICATIONS:  1. Amitriptyline 50 mg p.o. q.h.s.  2. Aspirin 81 mg daily.  3. Lisinopril 20 mg daily.  4. Lisinopril/hydrochlorothiazide 20/12.5 one tablet daily.  5. Metformin 850 mg b.i.d.  6. Prilosec 20 mg daily.  7. Reglan 15 mg every 6 hours.   ALLERGIES:  1. PERCOCET.  2. ERYTHROMYCIN.   PAST MEDICAL HISTORY:  1. History of cyclic vomiting with a normal gastric emptying study in      June 2008, so the patient does not have diabetic gastroparesis, but  instead has a cyclic vomiting syndrome.  2. Diabetes mellitus type 2.  3. Chronic back pain.  4. History of foot ulcer.  5. Tobacco abuse.  6. History of migraines.  7. Diabetic neuropathy.  8. Hypertension.  9. Anxiety.  10.Depression.   PAST SURGICAL HISTORY:  1. Cholecystectomy in 04/15/01.  2. Status post three C-sections.  3. Total abdominal hysterectomy and left salpingo-oophorectomy.   FAMILY HISTORY:  Mother deceased from an MI in 04-16-2003.  Family history  also significant for type 2 diabetes in first-degree relatives.   SOCIAL HISTORY:  The patient is monogamous and has four children.  Her  daughter lives with her.  She lives with a cousin as well.  She does not  currently work.  Denies alcohol or drug use but smokes about one-third  pack per day.   REVIEW OF SYSTEMS:  Positive for anorexia, abdominal pain, abnormal  bleeding.  She denies chest pain, syncope, dyspnea on exertion,  prolonged cough, melena, hematochezia,  hematuria.   PHYSICAL EXAMINATION:  VITAL SIGNS:  Temperature 98.2, pulse 135, blood  pressure 174/105, weight 134.5 pounds.  GENERAL:  The patient is in moderate distress with garbage can at the  bedside that has yellow emesis in it.  HEENT:  Normocephalic, atraumatic.  Eyes are not icteric.  The patient  is making tears.  Mucous membranes are slightly dry.  There is no  erythema or exudate.  LUNGS:  Normal respiratory effort.  Lungs clear to auscultation  bilaterally.  CARDIOVASCULAR:  Regular rate and rhythm.  No murmurs, rubs or gallops.  ABDOMEN:  Bowel sounds hypoactive.  Significant epigastric tenderness  with guarding.  Some rebound as well which is consistent with my  previous examinations of her in the hospital.  No other tenderness and  no hepatosplenomegaly.  RECTAL EXAM:  Moderate amount of heme-negative stool in the vault,  broken up digitally but not completely disimpacted.  PULSES:  DP and PT pulses 2+.  EXTREMITIES:  There is tenting of  the skin and 2+ DP and PT pulses as  noted above.  NEUROLOGIC:  Alert and oriented x3.  Cranial nerves II-XII grossly  intact.  Sensation intact to light touch in upper and lower extremities.   ASSESSMENT AND PLAN:  This is a 45 year old female with:  1. Nausea and vomiting.  This is consistent with previous admissions      for cyclic vomiting syndrome.  Of note, the patient has had a      normal gastric emptying study as recent as June, so it is not      diabetic gastroparesis.  Will rule out an MI as the patient is      diabetic with cardiac enzymes and EKG.  Will check comprehensive      metabolic panel to assess liver and biliary system.  Lipase to      assess for pancreatitis.  The patient will be n.p.o.  Will start      Phenergan and morphine.  Bolus IV fluids and start D5/half normal      saline while she is n.p.o.  Check a KUB to rule out free air with      rebound and guarding.  Will advance her diet as tolerated when      nausea and vomiting resolves.  2. Hypertension.  Will start metoprolol 5 mg IV as need for blood      pressure greater than 180/110.  3. Diabetes mellitus type 2.  Will hold all her home medications and      start a sensitive sliding scale q.4h.  We will be giving her some      glucose in her IV fluids since she is n.p.o.  She had A1c recently      checked in June so we need to reassess.  It was 6.6 at that time.  4. Constipation.  She was on senna after her last hospitalization but      not tolerating anything by mouth at this time.  Will hold senna and      other medications.  Dulcolax suppository x1.  Stool is not hard on      digital exam so she was should respond to this.  5. Abdominal pain, likely related to gastroparesis.  See #1 above for      assessment.  6. Tobacco dependence.  Will not start a patch at this time.  She only      smokes about three to four cigarettes per day.  Will obtain a      smoking cessation consult.      Karlton Lemon,  M.D.  Electronically Signed      Blane Ohara McDiarmid, M.D.  Electronically Signed    SH/MEDQ  D:  09/17/2007  T:  09/18/2007  Job:  [id number]

## 2011-05-11 NOTE — Discharge Summary (Signed)
[name], [name] [name]               ACCOUNT NO.:  [id number]   MEDICAL RECORD NO.:  [id number]          PATIENT TYPE:  INP   LOCATION:  [id number]                         FACILITY:  [facility]   PHYSICIAN:  Cat Ta, MD             DATE OF BIRTH:  25-Dec-1966   DATE OF ADMISSION:  01/24/2009  DATE OF DISCHARGE:  01/27/2009                               DISCHARGE SUMMARY   ADDENDUM   DISCHARGE MEDICATIONS  Protonix 40 mg p.o. twice daily.      Merla Riches, MD  Electronically Signed     CT/MEDQ  D:  01/28/2009  T:  01/29/2009  Job:  [id number]

## 2011-05-11 NOTE — H&P (Signed)
NAME:  [name] [name], [name] [name]               ACCOUNT NO.:  [id number]   MEDICAL RECORD NO.:  [id number]          PATIENT TYPE:  INP   LOCATION:  4705                         FACILITY:  [facility]   PHYSICIAN:  Clifton Custard, M.D.      DATE OF BIRTH:  1966/05/21   DATE OF ADMISSION:  06/18/2007  DATE OF DISCHARGE:                              HISTORY & PHYSICAL   PRIMARY CARE PHYSICIAN:  Dr. [name] with the family practice center.   CHIEF COMPLAINT:  Vomiting blood.   HISTORY OF PRESENT ILLNESS:  The patient is a 45 year old African-  American female well-known to the family practice teaching service with  history of type 2 diabetes and gastroparesis with multiple admissions  for persistent nausea and vomiting, who began having severe nausea with  several episodes of vomiting yesterday afternoon.  The vomiting then  became bloody and has been associated with abdominal pain.  She tried  taking Phenergan at home but has been unable to keep it down.  She did  have a normal bowel movement this morning but denies any melena or  hematochezia.  She denies any recent NSAID use.  Most recent  hospitalization was from June 11 through June 10, 2007, for similar  complaints.  She did have a gastric emptying study done during that  admission, which was normal.  She also has a history of gastritis for  which she had an EGD in February 2008, demonstrating this.   PAST MEDICAL HISTORY:  1. Type 2 diabetes.  2. Recurrent gastroparesis.  3. Gastritis.  4. Hypertension.  5. Depression.  6. Anxiety.  7. Tobacco abuse.  8. Chronic back pain.  9. Urinary tract infection on June 06, 2007, which was with culture-      positive E-coli.   PAST SURGICAL HISTORY:  1. Hysterectomy with lysis of adhesions.  2. Cholecystectomy.   MEDICATIONS:  1. Lisinopril/HCTZ 20/12.5 once daily.  2. Metformin 1000 mg in the morning, 500 mg in the evening.  3. Senna, two tablets p.o. q.h.s. p.r.n.  4. Vicodin 1 tablet 1 p.o.  q.6 h. p.r.n.  5. Prilosec OTC 20 mg p.o. b.i.d.  6. Phenergan 25 mg p.o. q.6 h. p.r.n. nausea.   ALLERGIES:  Erythromycin causes stomach upset.   FAMILY HISTORY:  Mom with diabetes and died of coronary artery disease.   SOCIAL HISTORY:  She lives alone.  She has 4 children.  She smokes 5 to  6 cigarettes a day.  She denies any alcohol or other drug use.   REVIEW OF SYSTEMS:  She denies any chest pain, shortness breath,  lightheadedness, dizziness, headache, vision changes, dysuria, fevers.   PHYSICAL EXAM:  VITALS:  Temperature 98.1, pulse 118, blood pressure  197/112, respiratory rate 19,  96% on room air.  GENERAL:  Alert but ill-appearing, appears very uncomfortable.  HEENT:  Mucous membranes are dry.  Pupils are equally round, reactive to  light and accommodation.  Extraocular motions intact.  NECK:  No JVD.  LUNGS: Clear to auscultation bilaterally.  CARDIOVASCULAR:  Regular rate and rhythm.  No murmurs, rubs or gallops.  ABDOMEN:  Hypoactive bowel sounds.  Abdomen is soft but diffusely tender  to palpation, worse in the mid-epigastric region.  No rebound tenderness  or peritoneal signs.  No CVA tenderness.  EXTREMITIES:  No signs clubbing or edema.   LABS:  Still pending.   ASSESSMENT/PLAN:  A 45 year old African-American female with intractable  nausea, vomiting and now with hematemesis.  1. Nausea, vomiting, likely a recurrence of the patient's      gastroparesis.  Will start IV Phenergan, Reglan.  Will offer NG      tube, although she usually refuses this.  2. Hematemesis.  History of gastritis on EGD in February 02, 2006.  She      also likely has a Mallory-Weiss tear from her persistent vomiting.      Will start Protonix 40 mg IV b.i.d. and follow serial hemoglobin      and hematocrits.  If hematemesis persists, then will likely need GI      consult.  3. Hypertension.  Currently elevated, as she did not have medications      and she is with pain and still  frequently vomiting in the ER.  Will      treat with IV Lopressor while she is n.p.o.  4. Fluid and electrolytes.  Clinically, she is hydrated.  Will re-      hydrate with IV fluids.  Electrolytes are pending.  Will make      n.p.o. for now and tolerate improvement in her nausea and vomiting.  5. Diabetes.  Will place on sliding scale insulin.  Will check      hemoglobin A1c.  6. Tobacco abuse.  Recommend smoking cessation.  She declines nicotine      patch.  7. Infectious disease.  Recent urinary tract infection with E-coli.      Will re-check urinalysis, although pyelonephritis unlikely, based      on physical exam.      Clifton Custard, M.D.     MR/MEDQ  D:  06/18/2007  T:  06/18/2007  Job:  [id number]

## 2011-05-11 NOTE — H&P (Signed)
NAME:  [name] [name], [name] [name]               ACCOUNT NO.:  [id number]   MEDICAL RECORD NO.:  [id number]          PATIENT TYPE:  INP   LOCATION:  5736                         FACILITY:  [facility]   PHYSICIAN:  Kaylyn Layer, M.D.  DATE OF BIRTH:  1966/02/20   DATE OF ADMISSION:  06/07/2007  DATE OF DISCHARGE:                              HISTORY & PHYSICAL   CHIEF COMPLAINT:  Nausea and vomiting x2 days.   PRIMARY CARE PHYSICIAN:  [name], M.D.   HISTORY OF PRESENT ILLNESS:  This patient is a 45 year old female with  hypertension, type 2 diabetes, with gastroparesis and multiple  admissions for this; as well as depression and anxiety.  She presents to  the clinic today with a 2-day history of nausea, vomiting and epigastric  pain.  The patient had been seen in the ER twice in the last 24 hours  for this problem.  She was prescribed Phenergan and Vicodin, but has not  started taking these.  The patient continued to vomit after leaving ER  early this morning.  Some of the vomit has been dark burgundy in color.  The patient has not been able to tolerate p.o. in the last 2 days.  The  patient states that her symptoms are consistent with her typical  presentation of gastroparesis.   PAST MEDICAL HISTORY:  1. Type 2 diabetes.  2. Recurrent gastroparesis  3. History of recurrent urinary tract infection.  4. Hypertension.  5. Depression and anxiety.  6. Tobacco abuse  7. Chronic back pain.   MEDICATIONS:  1. Lisinopril HCTZ 20/12.5 one tablet p.o. daily  2. Metformin 150 mg one tablet p.o. b.i.d.  3. Phenergan 25 mg one tablet p.o. q.4-6 h.  4. Prilosec OTC 20 mg one tablet p.o. daily.  5. Reglan 10 mg p.o. q.a.c. and q.h.s.  The patient also states she takes Lantus 12 units at night, but this is  not on her [facility] med list.   ALLERGIES:  ERYTHROMYCIN (causes stomach upset).   FAMILY HISTORY:  The patient's mom had type 2 diabetes and passed away  of a heart attack.   SOCIAL HISTORY:  The patient lives alone.  She has 4 children.  She  smokes 5-6 cigarettes a day.  Denies alcohol and drugs.   PHYSICAL EXAM:  VITAL SIGNS:  Temperature unable to determine, pulse  rate 124, blood pressure 193/109.  GENERAL:  The patient is alert.  She is lying on her stomach and  somewhat ill appearing.  HEENT:  Head:  Normocephalic, atraumatic.  Eyes: Vision is grossly  intact.  Mouth:  Dry mucous membranes.  LUNGS:  Clear.  HEART:  Regular rate and rhythm.  No murmurs, rubs or gallops.  ABDOMEN:  Diffuse tenderness to palpation in upper quadrants.  No  rebound or guarding.  Normal active bowel sounds.  EXTREMITIES:  No clubbing, cyanosis or edema.  Distal pulses 2+ and  equal.  SKIN:  Intact without lesions or rashes.  PSYCHIATRIC:  The patient is tearful, somewhat agitated with answering  questions.  NEUROLOGIC:  Cranial nerves are grossly intact.   ASSESSMENT/PLAN:  This is a 45 year old female with persistent nausea,  vomiting and epigastric pain.  1. NAUSEA AND VOMITING.  Given her prior history, this is most likely      gastroparesis.  We will admit her to the hospital for IV fluids and      symptom management.  We will give her IV Reglan and Phenergan.      Will place an NG tube.  Her hospital team has been notified and      will defer imaging until she is evaluated by them.  We will check a      UA, CMP and CBC with differential.  The patient may need a GI      consult and/or a gastric emptying study, since this is a frequent      problem for her requiring admission.  2. HYPERTENSION.  Her blood pressure was elevated.  This is probably      from pain and vomiting.  There is no need for emergent control with      IV medications.  We are holding her p.o. medications for now.  3. TYPE 2 DIABETES.  She will be n.p.o.  We will check CBG q.6 h and      cover her with sliding scale.  4. ELECTROLYTES AND NUTRITION.  We will run normal saline 150 mL per      hour.   She will be n.p.o.      Kaylyn Layer, M.D.     KS/MEDQ  D:  06/07/2007  T:  06/08/2007  Job:  [id number]   cc:   [name], M.D.

## 2011-05-11 NOTE — H&P (Signed)
NAME:  [name] [name], [name] [name]               ACCOUNT NO.:  [id number]   MEDICAL RECORD NO.:  [id number]          PATIENT TYPE:  INP   LOCATION:  6729                         FACILITY:  [facility]   PHYSICIAN:  Talbert Cage, M.D.DATE OF BIRTH:  08-Oct-1966   DATE OF ADMISSION:  04/22/2008  DATE OF DISCHARGE:                              HISTORY & PHYSICAL   FAMILY CARE PHYSICIAN:  [name], M.D.   CHIEF COMPLAINT:  Nausea, vomiting, and abdominal pain.   HISTORY OF PRESENT ILLNESS:  Ms. Greiff is a 46 year old female with a  history of cyclic vomiting syndrome who presents with essentially her  usual symptoms of vomit, except this time it woke her from her sleep,  the morning prior to admission at 3 a.m.  It started off also with  vomiting blood.  She continued to have vomiting, so presented to the  [facility] Emergency Department at 5 o'clock that morning.  She could  not take her usual home medications secondary to loose stools, diarrhea,  and vomiting prior to arrival.  Therefore, she was unable to try to  succumb her symptoms.  She states that the pain this time is worse than  it usually is and associated with heartburn symptoms.  She states that  she feels that she is very sick.  She also describes feeling hot.  She  denies any recent sick contacts.  The pain is primarily located in the  epigastric region, and her abdomen feels sore.   MEDICATIONS:  1. Aspirin 81 mg p.o. daily.  2. Metformin 850 mg p.o. b.i.d.  3. Prilosec 20 mg p.o. daily.  4. Reglan 5 mg p.o. q.6 h.  5. Nortriptyline 50 mg p.o. nightly.  6. Ativan 1 mg 1 tab q.8 h. at the first sight of nausea.  7. Promethazine 25 mg q.4 h. at the first sight of nausea.  8. Topamax 50 mg p.o. b.i.d.  9. Coreg 12.5 mg p.o. b.i.d.  10.Prozac 20 mg p.o. daily.  11.Lantus 12 units daily.  12.MiraLax as needed for constipation.  13.Flexeril 5 mg 1 tab by mouth 3 times a day as needed.  14.Dilaudid 4-mg tabs to take one-half  to one tab by mouth every 4      hours as needed.  15.Promethazine suppositories 25 mg 1 every 6 hours as needed.   ALLERGIES:  PERCOCET and ERYTHROMYCIN.   PAST MEDICAL HISTORY:  1. The patient has cyclic vomiting syndrome and abdominal migraines      with multiple hospital admissions.  She also has a history of      diabetic ulcer, which has resolved.  She had a normal gastric      emptying study in June 2008.  2. She has a history of left adnexal cyst.  She has a history of      recurrent UTI.  She has a Port-A-Cath secondary to frequent      dehydration and nausea following admit.  3. She has history of GERD.  She has history of hematemesis.  She has      history of chronic back pain.  She  has a history of tobacco      dependence.  She has a history of diabetic neuropathy.  4. History of migraine.  5. Insomnia.  6. Hypertension.  7. Type 2 diabetes.  8. Depression.  9. Anxiety.   FAMILY HISTORY:  The patient has diabetes in first-degree relative and  her mother died of an MI in Apr 24, 2003.  The patient is very sad about  this.   SOCIAL HISTORY:  The patient is monogamous and has 4 children including  twin sons, a son and a daughter, all in there 53s.  She lives with a  cousin and is out of work currently.  She has a tenth grade education.  She denies any physical abuse, alcohol, or drugs.  She does have a  history of marijuana use.  She smokes 1 pack per day every 3 days.  Her  brother died in the winter when she was 35, and she tends to get very  depressed at this time of the year.  She did have a suicidal attempt in  the winter of 2003.   REVIEW OF SYSTEMS:  Please see HPI, but otherwise, the patient complains  of abdominal pain, diarrhea, nausea, vomiting, and hematemesis.  Otherwise, her review of systems is negative and greater than 10 points.   PHYSICAL EXAM:  VITAL SIGNS:  O2 sat 100% on room air, temperature 97.3  degrees Fahrenheit, pulse 90, respirations 22,  blood pressure 163/101.  GENERAL:  This is an thin African American female, lying in bed, and she  appears uncomfortable.  HEENT:  Normocephalic, atraumatic without obvious abnormalities.  Pupils  were equal and reactive to light with no scleral injection.  Ears reveal  no external deformities.  Nose reveals no external deformities.  Mouth  reveals poor dentition and thick mucous membranes.  NECK:  Supple without any lymphadenopathy appreciated.  CHEST WALL:  A Port-A-Cath present in the right upper chest without  problems and signs and symptoms of infection.  LUNGS:  Normal respiratory effort.  Lungs are clear to auscultation  bilaterally without wheezes, rales, or rhonchi.  HEART:  Regular rate and rhythm with a normal S1 and S2 and a 2/6-  systolic ejection murmur in the upper sternal border that does not  radiate.  ABDOMEN:  Diffusely tender, but worse at the epigastric region.  It is  tender even to light touch.  There is no guarding, no rebound.  The  belly is soft.  There is no hepatosplenomegaly and no masses  appreciated.  Bowel sounds are hyperactive.  There are multiple scars  from previous surgeries in the abdomen as well as striae likely from her  previous pregnancies.  EXTREMITIES:  Pulses are equal in all extremities.  Extremities reveal  no cyanosis, clubbing, or edema.  NEUROLOGIC:  She is alert and oriented x3 with cranial nerves II through  XII grossly intact and strength normal in all extremities.  SKIN:  No rashes.  PSYCH:  She is cheerful and moderately anxious.   LABORATORY DATA:  Urinalysis reveals 1.015, pH 8.5, glucose 100, 15  ketones, no blood, 0-2 white blood cells, 2-6 red blood cells, many  bacteria, few epithelial cells, beta-hCG was negative, lipase was 24.  She was gastric occult positive.  CBC reveals white blood cell count  9.3, hemoglobin 13.7, hematocrit 39.3, and platelet count 194 with a  normal differential.  Basic metabolic panel reveals  sodium 139,  potassium 4.0, chloride 103, bicarb 23, BUN 16, creatinine 1.1,  and  glucose 282.  Total bilirubin 0.6, alkaline phosphatase 99, AST 15, ALT  10, calcium 9.7, total protein 7.5, and albumin 4.0.   ASSESSMENT:  This is a 45 year old female with hematemesis with cyclic  vomiting syndrome, diabetes with complications, hypertension, history of  anemia, migraine, depression, and anxiety, gastroesophageal reflux  disease, and history of urinary tract infections.   PLAN:  1. Cyclic vomiting with hematemesis.  We will monitor her CBCs for      signs of significant blood loss and consult GI if this becomes      worse.  In the meantime, we will watch this closely.  We will start      the patient's usual regimen of Reglan IV, Phenergan IV, and Zofran      IV as well as scheduled Ativan to help her through this following      episode.  If no improvement, we will consider scheduling her      Phenergan.  We will provide scheduled Tylenol 500 mg q.4 h. and      Dilaudid for pain relief during this episode at her home dose      except given per IV.  We will continue her Topamax as it is felt      that part of her abdominal pain is an abdominal migraine and      resisting to keep her out of the hospital more recently.  We will      add Protonix 40 mg IV b.i.d. given her hematemesis for GI      protection, and while she is taking by mouth, consider something      like Carafate or Pepto-Bismol to see if this helps as well.  We      will hydrate with D5 normal saline at 150 mL an hour and advance      diet slowly as the patient tolerates it.  We will monitor her I's      and O's closely to make sure that she is getting adequate intake.      If her pain gets worse or she does not improve as expected, we      would have a low threshold to get abdominal CT to further evaluate.      For now, we will hold off given that the patient does not have      rebound or guarding and has reason for  abdominal pain, i.e.,      retching.  2. Diabetes with complications.  We will continue the patient on her      Lantus, but at a decrease dose of 8 units nightly.  If needed, we      will increase her to 12 units per her usual home dose.  We will      cover her in the meantime to consider the sliding scale insulin.      We will hold that appointment given her stomach upset for now.  3. Hypertension.  The patient is significantly hypertensive currently.      We will continue her Coreg.  The patient likely could not keep her      medicine down last night or this morning.  The patient generally      improved blood pressure wise as her pain improved slowly, we will      monitor closely as her pain improves.  We will provide hydralazine      2 mg IV for systolic blood pressure greater than 190 and asked  the      health officer to be called if this is used.  We will not use an      ACE inhibitor since the patient is frequently dehydrated.  4. History of anemia.  She is not currently anemic.  We will monitor      closely given her hematemesis and her fluid resuscitation.  5. Migraine.  We will continue her Topamax.  6. Depression and anxiety.  We will continue her on nortriptyline and      Prozac at her home doses and monitor her closely.  7. GERD.  We will continue Protonix 40 mg IV b.i.d., while she is      having hematemesis and transition to p.o. while she is able to take      p.o.  8. History of UTI.  She does have many bacteria, but not pyuria at      this point.  She is not complaining of any symptoms currently, but      given her history, we will check a urine culture to further      evaluate to see if this is a cause for her abdominal pain.  9. FEN/GI.  Moved about to clear diet.  Her lytes are currently      stable, but we will check them in the morning.  We will provide      Protonix as above and IV fluids as above.  10.Prophylaxis.  We will provide SCDs.  Given her current  bleeding, we      will hold on anticoagulations now      including her aspirin.  11.Disposition.  Pending symptom control and her ability to take      fluids encouraged by mouth adequately in addition to her medicines.      Patria Mane, MD  Electronically Signed      Talbert Cage, M.D.  Electronically Signed    SA/MEDQ  D:  04/24/2008  T:  04/25/2008  Job:  [id number]

## 2011-05-11 NOTE — H&P (Signed)
NAME:  [name] [name], WIMBISH NO.:  [id number]   MEDICAL RECORD NO.:  [id number]          PATIENT TYPE:  INP   LOCATION:  5501                         FACILITY:  [facility]   PHYSICIAN:  Blane Ohara McDiarmid, M.D.DATE OF BIRTH:  November 01, 1966   DATE OF ADMISSION:  04/03/2009  DATE OF DISCHARGE:                              HISTORY & PHYSICAL   PRIMARY CARE PHYSICIAN:  [name], MD at [facility].   CHIEF COMPLAINT:  Vomiting and abdominal pain.   HISTORY OF PRESENT ILLNESS:  This is a 45 year old African American  female with multiple medical problems including cyclical vomiting  syndrome and diabetes who presented to [facility] ED early this morning  with nausea, vomiting, and abdominal pain.  She was found to have an  elevated lipase thus was transferred to [facility] for  admission for acute pancreatitis.   The patient reports she developed nausea and vomiting over the past  weekend.  She was seen in the ED over the weekend and sent home.  She  returned early this morning because the vomiting has persisted and she  is having worsening abdominal pain.  She has been unable to take her  oral medications at home.  She denies associated fever or chills.  Denies any diarrhea or blood in her stool.  She does report occasional  light brown streaks in her vomit, which is typical for her, but no frank  hematemesis.   PAST MEDICAL HISTORY:  1. Type 2 diabetes with complications.  2. Cyclical vomiting syndrome and abdominal migraines with multiple      hospital admissions and normal gastric emptying study.  3. Hypertension.  4. Diabetic neuropathy.  5. Gastroesophageal reflux disease.  6. Anemia.  7. Chronic back pain.  8. Migraines.  9. Depression and anxiety.  10.Insomnia.  11.Tobacco dependence.  12.History of pyelonephritis  13.Status post cardiac catheterization in August 2008 with normal      coronaries.   PAST SURGICAL HISTORY:  1.  Status post cholecystectomy in January 2002.  2. Status post three C sections  3. Status post total abdominal hysterectomy and right salpingo-      oophorectomy for right pelvic pain and right tubo-ovarian abscess.  4. Status post lysis of adhesions for small bowel obstruction.  5. Status post central venous port placed in August 2006.   CURRENT MEDICATIONS:  1. Aspirin 81 mg p.o. daily.  2. Reglan 5 mg p.o. q.a.c. and at bedtime.  3. Lorazepam 1 mg p.o. every 6 hours as needed for nausea.  4. Promethazine 25 mg tablets and suppositories every 4 hours as      needed.  5. Topamax 50 mg p.o. b.i.d.  6. Lantus 15 units daily.  7. MiraLax as needed.  8. Promethazine 25 mg/mL injection, inject 1 mL IM every 6 hours as      needed for intractable nausea and vomiting.  9. Metformin 1000 mg p.o. b.i.d. with food.  10.Vicodin 5/500 one to two tablets p.o. every 6 hours as needed.  11.Coreg 25 mg p.o. b.i.d.  12.Flexeril 5 mg p.o.  t.i.d. as needed.   ALLERGIES:  PERCOCET and ERYTHROMYCIN.   FAMILY HISTORY:  The patient's mother died of heart attack in 13-Apr-2003.  The  patient has a first-degree relative with diabetes also has 3-4 siblings  with hypertension.  No family history of migraines or cyclical vomiting.   SOCIAL HISTORY:  The patient is in a monogamous relationship.  She has  four children including twin sons, all are adult.  She lives pretty much  alone.  She has a 10th grade education level.  She denies any alcohol or  drug use.  Smokes one-third pack per day.  The patient does have a  history of suicide attempt.  She is currently working in [employer].   PHYSICAL EXAMINATION:  VITAL SIGNS:  Temperature is 96.9, heart rate 78,  respiratory rate 20, blood pressure 191/104, oxygen saturation 100% on  room air.  GENERAL:  The patient is awake, alert, writhing in pain.  HEENT:  Head is normocephalic and atraumatic.  No abnormalities noted.  Eyes have no scleral icterus.  Ears have no  external deformities.  Nose  has no external deformities and nares without discharge.  Mouth has  slightly dry mucous membranes and no erythema or exudate.  LUNGS:  Clear to auscultation bilaterally with normal work of breathing  and no wheezes, rales, or rhonchi.  HEART:  Regular rate and rhythm with no murmurs, rubs, or gallops and 2+  dorsalis pedis pluses.  ABDOMEN:  Normoactive bowel sounds and soft, diffusely tender  particularly in epigastrium, but no rebound or guarding and  nondistended.  EXTREMITIES:  No clubbing, cyanosis, or edema.  NEUROLOGIC:  Alert and oriented x3 and cranial nerves grossly intact.  Strength and sensation normal in all extremities.  SKIN:  Has no rash and has normal color and turgor.  PSYCH:  Cognition and judgment appear intact.  The patient is alert and  cooperative with normal attention with fading concentration.  No  apparent delusions, illusions, or hallucinations.  No suicidal or  homicidal ideations   LABORATORY DATA:  CBC reveals a white blood cell count 8.2, hemoglobin  12.5, and platelets 242.  MCV is 96.9.  Complete metabolic panel reveals  a sodium of 139, potassium 3.4, chloride 100, bicarb 31, glucose 257,  BUN 28, creatinine 1.81, total bilirubin 0.4.  Remainder of LFTs are  normal.  Calcium is 9.8, lipase is elevated at 169.  Urine shows a  specific gravity of 1.012, glucose 250, small blood, is otherwise  negative.  Micro reveals few squamous epithelial cells, 0-2 white blood  cells and red blood cells and many bacteria.   ASSESSMENT/PLAN:  This is a 45 year old African American female with  multiple medical problems admitted for acute pancreatitis and  intractable nausea and vomiting.   1. Acute pancreatitis:  We will make the patient n.p.o. for now.  We      will give IV morphine, Zofran, and Phenergan as needed for      symptomatic control.  We will give scheduled IV Reglan.  We will      give IV fluids to treat mild dehydration.   Based on laboratory data      and Ranson's criteria, the patient's acute pancreatitis was mild.  2. Persistent vomiting:  The patient has cyclical vomiting syndrome.      We will give Phenergan and Zofran as above for pancreatitis.  We      will continue home dose of scheduled Reglan and give lorazepam  p.r.n.  We will give both IV until tolerating p.o.  3. Type 2 diabetes with complications:  CBGs are currently running      high.  We will give sliding scale insulin coverage for now and hold      Lantus and metformin until taking p.o.  We will give D5 and IV      fluids.  4. Hypertension:  Currently, blood pressure is significantly elevated      as unable to tolerate p.o. medications.  We will continue holding      Coreg until tolerating oral intake.  We will give IV labetalol as      needed.  5. Gastroesophageal reflux disease:  The patient is not currently on      home medications other than Reglan.  We will give Reglan IV as      above and consider adding Protonix if symptomatic.  6. Unspecified anemia:  Hemoglobin is currently normal and stable with      normal MCV.  7. Depression:  The patient is currently stable, not on any home      medications other than lorazepam as needed.  We will continue.  8. Fluids, electrolytes and nutrition/gastrointestinal:  We will make      the patient n.p.o. for now as above to allow for bowel rest.  The      patient may try clears when ready and advance as tolerated.  She is      usually a good judge of this.  With D5 normal saline at 115 hour      with 20 mEq of potassium chloride per liter while n.p.o. to      rehydrate.  Potassium was 3.4 on arrival to the emergency      department, thus added KCl with fluids.  She was already repleted      in [facility] Emergency Department and we will avoid further IV      potassium runs unless dropping further.   DISPOSITION:  The patient typically turns around within a couple of  days, however,  pancreatitis made prolonged stay more than usual.  The  patient has IM Phenergan at home for management of nausea and vomiting.  The goal is to get her symptoms adequately controlled on combo of this  n.p.o. regimen for nausea and pain control.       Shella Maxim, M.D.  Electronically Signed      Blane Ohara McDiarmid, M.D.  Electronically Signed    EE/MEDQ  D:  04/03/2009  T:  04/04/2009  Job:  [id number]

## 2011-05-11 NOTE — Discharge Summary (Signed)
NAME:  [name] [name], [name] [name]               ACCOUNT NO.:  [id number]   MEDICAL RECORD NO.:  [id number]          PATIENT TYPE:  INP   LOCATION:  5002                         FACILITY:  [facility]   PHYSICIAN:  Domingo Cocking. Jimmye Norman, M.D.DATE OF BIRTH:  1966-05-10   DATE OF ADMISSION:  10/02/2007  DATE OF DISCHARGE:  10/05/2007                               DISCHARGE SUMMARY   PRIMARY CARE Heavyn Yearsley:  Dr. [name] at the [facility].   DISCHARGE DIAGNOSES:  1. Cyclic vomiting syndrome.  2. Anxiety.  3. Diabetes mellitus type 2.  4. Hypertension.  5. Anemia.   DISCHARGE MEDICATIONS:  1. Aspirin 81 mg p.o. daily.  2. Lisinopril 20 mg p.o. daily.  3. Lisinopril/hydrochlorothiazide (20/12.5) p.o. once daily.  4. Metformin 850 mg p.o. b.i.d.  5. Nortriptyline 25 mg p.o. q.h.s.  6. Lorazepam 1 mg q.8 hours at first sign of nausea.  7. Promethazine 25 mg p.o. q.4 hours at first sign of nausea.  8. Omeprazole 20 mg p.o. daily.   CONSULTATIONS:  None.   PROCEDURES:  None.   LABORATORY DATA:  On October 02, 2007, lipase 19, sodium 135, potassium  3.8, chloride 99, CO2 of 26, BUN 17, creatinine 1.94, glucose 174,  calcium 9.9, total bilirubin 0.7, alkaline phosphatase 55, AST 18, ALT  11, total protein 7.4, albumin 4.2.  October 03, 2007, urine drug screen  positive for marijuana metabolite; otherwise negative.  October 03, 2007,  24-hour urine protein 228 mg.  October 04, 2007, lipid profile:  Triglycerides 69, HDL 45, LDL 69.  BMET:  Sodium 137, potassium 4.0,  chloride 103, CO2 of 27, BUN 8, creatinine 1.15, glucose 1.41, calcium  8.6.  October 04, 2007, CBC:  White blood count 5.2, hemoglobin 8.9,  hematocrit 25.9, platelets 235, MCV 94.6, RDW 13.4.  Reticula site  percentage 0.7.  October 04, 2007, thyroid labs:  TSH 1.187, free T4 of  1.34, free T3 of 3.0.   BRIEF HOSPITAL COURSE:  This is a 45 year old female with cyclic  vomiting syndrome, admitted for recurrent  vomiting on October 6.  1. Vomiting:  Cyclic vomiting syndrome is the diagnosis.      Gastroparesis was ruled out by normal gastric emptying study in      June 2008.  Vomiting was treated with IV fluid boluses, and then      continuous rehydration, Ativan and Phenergan, and Zofran and      Reglan.  The patient will be discharged on a new daily prophylaxis,      which will be nortriptyline 25 mg p.o. q.h.s.  This should also      address her anxiety and insomnia.  At first sign of breakthrough      vomiting, she is to take Phenergan and lorazepam p.r.n., and if      this does not stop the vomiting cycle and admission required, then      she will be treated with IV antiemetics, lorazepam and fluids until      resolution.  2. Anxiety:  We have started her on nortriptyline for her anxiety, her  cyclic vomiting syndrome, and her for her insomnia starting at a      dose of 25 mg p.o. q.h.s.  3. Diabetes mellitus type 2:  Her home dose of metformin was held on      admission, and she was treated with sliding scale insulin with q.4      hour checks.  She had her metformin started the night prior to      discharge, and had one episode of hypoglycemia of 53 at 4 in the      morning on October 05, 2007.  She was given a glucose tablet, and      had normal glycemic values after that.  4. Hypertension:  Given her vomiting and volume contraction on      admission, her home doses of lisinopril and hydrochlorothiazide      were held.  They were restarted prior to admission.  5. Anemia:  Was found to have a hemoglobin and hematocrit of 8.9 and      25.9 during this admission.  She was guaiac negative on admission,      and we note that her reticula site count is in the abnormal range.      Though it is a normal value, it is abnormally low for somebody who      is this anemic.  The patient denied bright red blood per rectum,      black tarry stool and hematemesis.  She is status post       hysterectomy, and has not had menses for 5 years.  Considered      anemia of chronic disease as an etiology; however, this will      require followup as an outpatient.  The patient describes this as a      long-standing problem.  6. Social work:  The patient has difficulty obtaining her medications.      A social work consult was called during this admission to help      solve her problems and find sources for her medications.   DISCHARGE INSTRUCTIONS:  The patient has no restrictions on her  activity.  Should keep an ADA diet, and low-sodium, heart-healthy diet.   FOLLOWUP APPOINTMENTS:  The patient is scheduled with the [name] at 4:15 p.m. on October 10, 2007.   CONDITION ON DISCHARGE:  The patient was discharged to home in stable  medical condition.      Graciella Belton, MD  Electronically Signed      Domingo Cocking. Jimmye Norman, M.D.  Electronically Signed    MO/MEDQ  D:  10/06/2007  T:  10/07/2007  Job:  [id number]   cc:   [name], M.D.

## 2011-05-11 NOTE — Discharge Summary (Signed)
NAME:  [name] [name], [name] [name]               ACCOUNT NO.:  [id number]   MEDICAL RECORD NO.:  [id number]          PATIENT TYPE:  INP   LOCATION:  5501                         FACILITY:  [facility]   PHYSICIAN:  Dickie La, MD        DATE OF BIRTH:  [birth year]   DATE OF ADMISSION:  04/03/2009  DATE OF DISCHARGE:  04/06/2009                               DISCHARGE SUMMARY   PRIMARY CARE PHYSICIAN:  [name], MD   DISCHARGE DIAGNOSES:  1. Exacerbation of cyclical vomiting syndrome.  2. Acute pancreatitis.   ADDITIONAL DIAGNOSES:  1. Type 2 diabetes.  2. Hypertension.  3. Diabetic neuropathy.  4. Gastroesophageal reflux disease.  5. Anemia.  6. Chronic back pain.  7. Migraines.  8. Depression.  9. Anxiety.  10.Insomnia.  11.Tobacco dependence.   DISCHARGE MEDICATIONS:  1. Aspirin 81 mg p.o. daily.  2. Metformin 1000 mg p.o. b.i.d.  3. Prilosec 20 mg p.o. daily.  4. Lantus 20 units subcu nightly.  5. Flexeril 5 mg t.i.d. p.r.n.  6. Phenergan 25 mg p.o. q.4 h. p.r.n. nausea, Phenergan suppositories      25 mg PR q.6 h. p.r.n. nausea, and then Phenergan injections 25 mg      IM q. 6 h. p.r.n. nausea.  The patient has a regimen worked out      with her primary care physician where she chooses her method of      Phenergan as appropriate.  7. Reglan 5 mg p.o. t.i.d. before meals and nightly.  8. Lorazepam 1 mg p.o. q.6 h. p.r.n. anxiety.  9. Coreg 25 mg p.o. b.i.d.  10.Topamax 50 mg p.o. b.i.d.  11.MiraLax 17 g dissolved in 8 ounces of liquid p.o. p.r.n.      constipation.   BRIEF HOSPITAL COURSE:  1. Acute pancreatitis.  The patient had an initial lipase of 169.  She      was made n.p.o., treated with IV morphine, IV Zofran, Phenergan,      given IV fluids, and this gradually improved such that her lipase      upon discharge was 20.  Her triglycerides on April 04, 2009, were      normal at 66.  Additionally, her LDH was normal at 101.  At the      time of admission, her LFTs were within  normal limits.  2. Cyclical vomiting.  The patient is long being treated for cyclical      vomiting syndrome and is hospitalized frequently for this.  Again,      she was given Phenergan and Zofran as above.  She was continued on      her home dose of Reglan and Ativan.  Her symptoms gradually      resolved and felt that she was symptomatically ready for discharge      on the morning of April 06, 2009.  Of note, the patient does have      an appointment at [facility] tomorrow April 07, 2009.  She states that she is being evaluated to see if  she is a      candidate for the GI Clinic at [facility].  3. Diabetes.  The patient was placed on sliding scale insulin, and her      Lantus and metformin were held while her diet was adjusted.  She      will be sent home on her home regimen of Lantus 20 units nightly      and metformin 1000 mg b.i.d.  4. Acute renal failure.  The patient's initial creatinine was high at      1.8, and on the day of her discharge, it was at the upper limits of      normal at 1.15, likely her acute renal failure was due to      dehydration and possibly in combination with taking metformin.      During her hospitalization, she was re-hydrated and her metformin      was held.  5. Hypertension.  The patient is on her home dose of Coreg.  6. GERD.  We do not have Prilosec on her med list, but she states that      she takes over-the-counter Prilosec daily.  7. Anemia.  The patient's hemoglobin was stable during this      hospitalization.  Her discharge hemoglobin is 10.  8. Depression.  The patient takes lorazepam and will be sent home on      her same regimen of that.   FOLLOWUP:  The patient as mentioned previously is to follow up with [facility] tomorrow.  I have asked the patient to please  make an appointment with Dr. Ronnald Ramp within the next couple of weeks.   FOLLOWUP ISSUES:  No acute followup issues from this  hospitalization.      Carin Hock, MD  Electronically Signed      Dickie La, MD  Electronically Signed    SO/MEDQ  D:  04/06/2009  T:  04/07/2009  Job:  [id number]   cc:   [name], M.D.

## 2011-05-11 NOTE — Consult Note (Signed)
NAME:  [name] [name], [name] [name]               ACCOUNT NO.:  [id number]   MEDICAL RECORD NO.:  [id number]          PATIENT TYPE:  INP   LOCATION:  6729                         FACILITY:  [facility]   PHYSICIAN:  Doree Albee, M.D.DATE OF BIRTH:  Apr 29, 1966   DATE OF CONSULTATION:  05/10/2008  DATE OF DISCHARGE:                                 CONSULTATION   REFERRING PHYSICIAN:  [name].   CONSULTING PHYSICIAN:  Doree Albee, MD   IMPRESSION:  1. Nausea/cyclical vomiting.  2. Anxiety/depression.   RECOMMENDATIONS:  1. Chlorpromazine (Thorazine) liquid can be given sublingually instead      of Phenergan.  Also, consider haloperidol.  2. Use of Remeron instead of nortriptyline for her depression, may      benefit her in terms of her nausea and vomiting.  3. Ongoing gastroenterology input would be advisable.   HISTORY:  This 45 year old lady has had several admissions to the  hospital with nausea and vomiting.  She has been investigated for the  possibility of gastroparesis in view of her diabetes of 15-plus years  standing, but this has been proven to be negative, and it is felt that  the diagnosis she has is cyclical vomiting syndrome.  We are now  requested to give an opinion regarding management outside of the  hospital at home before she needs to be able to come into the hospital  because she gets severely dehydrated and poor shape.   PAST MEDICAL HISTORY:  Cyclical vomiting as mentioned above, diabetes,  gastroesophageal reflux disease, anemia, chronic back pain, diabetic  neuropathy, history of migraine headaches, hypertension, depression, and  anxiety.   PAST SURGICAL HISTORY:  Cholecystectomy in 2002.   ALLERGIES:  PERCOCET AND ERYTHROMYCIN.   MEDICATIONS:  Home medications include:  1. Aspirin 81 mg daily.  2. Metformin 850 mg b.i.d.  3. Prilosec 20 mg daily.  4. Reglan 5 mg every 6 hours.  5. Nortriptyline 1 tablet nightly.  6. Ativan 1 mg  every 8 hours at the first sign of nausea.  7. Phenergan 25-mg tablet every 4 hours  at the first sign of nausea.  8. Topamax 50 mg b.i.d.  9. Coreg 12.5 mg b.i.d.  10.Prozac 20 mg daily, although the patient says she is not tolerating      Prozac.  11.Lantus 12 units daily.  12.MiraLax daily.  13.Flexeril 5 mg t.i.d. p.r.n.  14.Dilaudid 4 mg one half to one tablet every 4 hours p.r.n.  15.Lisinopril 20 mg daily.   SOCIAL HISTORY:  The patient is single and lives alone.  She continues  to smoke cigarettes and is trying to quit.  She denies any alcohol  abuse.   REVIEW OF SYSTEMS:  She says she has abdominal pain with the nausea and  vomiting.  There are no other symptoms of all the 12 systems reviewed.   PHYSICAL EXAMINATION:  GENERAL:  Afebrile and hemodynamically stable.  ABDOMEN:  Soft at the present time and nontender.  LUNGS:  Lung fields are clear.  Saturation on room air is 100%.   RELEVANT DATA:  Lipase  is 63.  Liver function tests normal.  Electrolytes unremarkable with no evidence of acidosis or renal failure.   IMPRESSION:  I think this lady's symptoms are not going to be solved  overnight, but recommendations above could be tried to see if this helps  her.  Obviously, ongoing gastroenterology evaluation would be suggested.      Doree Albee, M.D.  Electronically Signed     NCG/MEDQ  D:  05/10/2008  T:  05/11/2008  Job:  [id number]

## 2011-05-11 NOTE — H&P (Signed)
NAME:  [name] [name], [name] [name]               ACCOUNT NO.:  [id number]   MEDICAL RECORD NO.:  [id number]          PATIENT TYPE:  EMS   LOCATION:  MAJO                         FACILITY:  [facility]   PHYSICIAN:  Domingo Cocking. Jimmye Norman, M.D.DATE OF BIRTH:  1966-08-16   DATE OF ADMISSION:  10/02/2007  DATE OF DISCHARGE:                              HISTORY & PHYSICAL   CHIEF COMPLAINT:  Vomiting.   HISTORY OF PRESENT ILLNESS:  A 45 year old female with past medical  history significant for cyclic vomiting syndrome, presents with 3-day  history of vomiting.  She presented to the family practice center this  morning with vomiting.  Was given morphine and Phenergan, and sent home.  Then, returned to the ED.  She complains of having vomited 6 times  today.  There was blood in the vomit during the last episode.  She  denies diarrhea, melena, and bright red blood per rectum.  She has eaten  a little solid food today and yesterday, and can tolerate p.o. liquids,  and is complaining of epigastric pain.  She was given morphine 2 mg and  Phenergan 25 mg again in the emergency department.   PAST MEDICAL HISTORY:  1. Cyclic vomiting syndrome.  She had a normal gastric-ending study in      June 2008, which ruled out gastroparesis.  2. Diabetes mellitus type 2.  3. Chronic back pain.  4. History of foot ulcer.  5. Tobacco abuse.  6. History of migraines.  7. Diabetic neuropathy.  8. Hypertension.  9. Anxiety.  10.Depression.  11.Status post cholecystectomy in 2002.  12.Status post C-section x3.  13.Total abdominal hysterectomy and left salpingo oophorectomy.   FAMILY HISTORY:  Mother deceased from a myocardial infarction.  Diabetes  mellitus type 2 in multiple first-degree relatives.   SOCIAL HISTORY:  She has four children, and lives with her daughter and  her cousin.  She is not working.  No alcohol.  No drugs.  She smokes a  half a pack of cigarettes per day.   MEDICATIONS:  Aspirin, lisinopril,  metformin, Prilosec,  lisinopril/hydrochlorothiazide, and Flexeril.   ALLERGIES:  PERCOCET and ERYTHROMYCIN.   REVIEW OF SYSTEMS:  Per HPI.   PHYSICAL EXAMINATION:  VITAL SIGNS:  Temperature 97.6, heart rate 107 to  110, respirations 18 to 24, blood pressure 192-197/102-107, saturation  97% on room air, 8/10 pain.  GENERAL:  Sleepy, moderate distress, minimally responsive.  CARDIO:  Regular rate and rhythm.  No murmurs, rubs or gallops.  PULMONARY:  Decreased work of breathing, clear to auscultation  bilateral.  ABDOMEN:  Decreased bowel sounds, soft, nontender.  No guarding or  rebound.  EXTREMITIES:  Nontender.  No edema.  EYES:  Sluggish pupillary reaction to light.   LABORATORY DATA:  CBC:  White blood count 8.1, hemoglobin 10.9,  hematocrit 32.3, platelets 293, MCV 95.8, RDW 13.6, ANC 5.3, ALC 2.1,  AMC 0.7.  CMP:  Sodium 135, potassium 3.8, chloride 99, CO2 of 26, BUN  17, creatinine 1.94, glucose 174, calcium 9.9, total protein 7.4,  albumin 4.2, AST 18, ALT 11.  Urinalysis had a specific gravity of  1.015, was cloudy with a pH of 8.5, trace hemoglobin, 15 ketones, 100  protein, nitrite negative, leukocyte esterase negative.  Hemoccult exam  was performed and was negative.   ASSESSMENT AND PLAN:  This is a 45 year old female with recurrent  vomiting, presenting with unknown episodes of vomiting x3 days.  1. Vomiting:  The patient does note have gastroparesis.  She was      diagnosed with cyclic vomiting syndrome, and the usual course of      care Ms. Herringshaw is Phenergan and morphine, along with rehydration.      We have ordered Phenergan 25 mg IV every 4-6 hours as needed, and      morphine 2 mg every 4 hours as needed.  Continue her home dose of      Prilosec 20 mg daily.  2. FEN/GI:  Diet is clear as tolerated.  Given additional 1000-mL      bolus of Lactated Ringers at this time, followed by Lactated      Ringers running at a rate of 125 mL per minute.  Recheck basic       metabolic panel in the morning.  Her creatinine baseline is      normally 1.0 to 1.2.  It is currently 1.94; so we will continue to      rehydrate and recheck.  3. Endocrine:  Hold metformin with concern for lactic acidosis.  Give      sliding scale insulin, and check capillary blood glucoses per      glycemic control protocol.  4. Renal:  Hold lisinopril for now.  5. Disposition:  Home once vomiting resolves and tolerating clear.      Graciella Belton, MD  Electronically Signed      Domingo Cocking. Jimmye Norman, M.D.  Electronically Signed    MO/MEDQ  D:  10/02/2007  T:  10/02/2007  Job:  [id number]

## 2011-05-11 NOTE — Discharge Summary (Signed)
NAME:  [name] [name], [name] [name]               ACCOUNT NO.:  [id number]   MEDICAL RECORD NO.:  [id number]          PATIENT TYPE:  INP   LOCATION:  6737                         FACILITY:  [facility]   PHYSICIAN:  Lyndon A. Walker Kehr, M.D.    DATE OF BIRTH:  1966-11-21   DATE OF ADMISSION:  01/24/2009  DATE OF DISCHARGE:  01/26/2009                               DISCHARGE SUMMARY   PRIMARY CARE [name]:  [name], MD, at [facility].   DISCHARGE DIAGNOSES:  1. Cyclic vomiting syndrome and abdominal migraines.  2. Gastroesophageal reflux disease.  3. Diabetes type 2.  4. Hypertension.  5. Depression and anxiety.  6. Tobacco dependent.  7. Hypokalemia.   DISCHARGE MEDICATIONS:  1. Ativan 1 mg every 6 hours as needed for nausea.  2. Reglan 5 mg p.o. t.i.d. a.c. and nightly.  3. Phenergan 25 mg p.o., IM, or per rectum q.4 h. p.r.n. nausea.  4. Topamax 50 mg p.o. b.i.d.  5. Metformin 1000 mg p.o. b.i.d.  6. Lantus 5 units subcu daily until back to eating a normal diet.  7. MiraLax p.r.n. constipation.  8. Vicodin 5/500 one to two tablets p.o. q.6 h. p.r.n. pain.  9. Coreg 25 mg p.o. b.i.d.   FOLLOWING MEDICATIONS WERE HELD:  Aspirin secondary to some blood in  emesis.   FOLLOWING MEDICATION WAS CHANGED:  Lantus was decreased to 5 units  secondary to decreased p.o. intake.  This should be titrated back up by  the patient's primary care [name].   HOSPITAL COURSE:  1. Cyclic vomiting syndrome/abdominal migraines:  The patient      frequently presents with exacerbations of her cyclic vomiting      syndrome.  She was started on IV Ativan and Reglan scheduled and      received morphine as needed for pain as well as Phenergan and      Zofran for nausea.  She was placed on maintenance for IV fluids      until she is able to tolerate p.o. meals.  The patient's      electrolytes initially were significant for potassium, which was      3.3 and this was repleted.  The patient's EKG was  normal.  The      patient was also placed on Protonix 40 mg IV b.i.d.  The patient      quickly transitioned to in advancing her diet.  On the day of      discharge, the patient was tolerating a full diet.  2. Diabetes mellitus type 2:  The patient's CBGs ranged from 65 to 94      while n.p.o.  As her diet was advanced, they did increase.  Given      that the patient was not on a full diabetic diet for very long, we      are hesitant to discharge her on her home regimen of Lantus.      Therefore, the patient is being discharged on 5 units of Lantus      subcu nightly.  She is to have a followup appointment with her  primary care [name] within 1 week of discharge.  At that time,      primary care may need to titrate Lantus back up depending on the      patient's home CBG regimen.  3. Hypokalemia:  Potassium is 3.3 upon admission.  This was repleted      with oral potassium and subsequently corrected.  4. Gastroesophageal reflux disease:  The patient's symptoms were      controlled on Protonix.  5. Hypertension:  The patient's blood pressures were well controlled      on home regimen of 25 mg of Coreg b.i.d.   DISCHARGE INSTRUCTIONS:  The patient is to stop her aspirin and to use  decreased dose of Lantus until her followup appointment.   DISCHARGE CONDITION:  Stable.   FOLLOWUP ISSUES:  1. Restart of aspirin after any hematemesis has resolved.  2. Titrating up of Lantus back to the patient's home regimen of 15      units daily.       Sherrell Puller, MD  Electronically Signed      Arty Baumgartner. Walker Kehr, M.D.  Electronically Signed    TCB/MEDQ  D:  01/26/2009  T:  01/27/2009  Job:  [id number]

## 2011-05-11 NOTE — H&P (Signed)
NAME:  [name] [name], [name] [name]               ACCOUNT NO.:  [id number]   MEDICAL RECORD NO.:  [id number]          PATIENT TYPE:  INP   LOCATION:  1833                         FACILITY:  [facility]   PHYSICIAN:  Madeleine B. Vanstory, M.D.DATE OF BIRTH:  [birth year]   DATE OF ADMISSION:  05/05/2007  DATE OF DISCHARGE:                              HISTORY & PHYSICAL   ADMISSION DIAGNOSES:  1. Abdominal pain.  2. Gastroparesis.  3. Pancreatitis.  4. Vomiting.   HISTORY OF PRESENT ILLNESS:  This is a 45 year old African-American  female who has been vomiting one time each morning x1 week.  For the  last 2 days, she developed vomiting several times a day and then this  morning noticed blood in her emesis.  No sick contacts.  No history of  alcohol use.  The patient does have a history of gastroparesis and  history of bowel obstruction.  Diabetes type 2, poor control.  She is  having diarrhea and epigastric pain.   REVIEW OF SYSTEMS:  Negative for fevers or chills.  Cardiovascular:  Negative for shortness of breath.  Pulmonary:  Negative for cough or  shortness of breath.  GI:  Positive for abdominal pain, nausea,  vomiting, and diarrhea.  GU:  Negative for dysuria.   PAST MEDICAL HISTORY:  1. Diabetes type 2.  2. Hypertension.  3. Depression.  4. Diabetic nephropathy.  5. Tobacco abuse.  6. Anxiety.  7. History of gastroparesis.  8. History of migraines.  9. History of suicide attempt.  10.History of pyelonephritis with hospitalization in 04/12/2003.  11.History of chronic UTIs.   PAST SURGICAL HISTORY:  1. Total abdominal hysterectomy and lysis of adhesions.  2. Bowel obstruction which required surgical intervention in 04-12-03.  3. Cholecystectomy.   MEDICATIONS:  1. Lantus 12 units subcutaneous at bedtime.  2. Lisinopril 20 p.o. daily.  3. Metformin 500 mg p.o. every evening and 1000 p.o. every morning.  4. Reglan 10 mg every 6 hours p.r.n.  5. Amlodipine 2 mg p.o. daily.   ALLERGIES:   PERCOCET MAKES HER ITCH.   PROCEDURES:  1. She has had a history of esophagogastroduodenoscopy which showed      esophagitis and gastroparesis in 04/11/2004.  2. Echo in 04/11/06 that showed an ejection fraction of 75% with mild      septal hypertrophy and left ventricular wall thickness.   FAMILY HISTORY:  Mother died of an MI in Apr 12, 2003.  She has diabetes in  first-degree relatives.   SOCIAL HISTORY:  She has 4 children, twin sons in their 61s.  The sons  are in late teens, the daughter 54.  She smokes tobacco, marijuana but  denies alcohol.  Tenth-grade education.   PHYSICAL EXAMINATION:  VITALS:  Temperature not done, blood pressure  213/93; repeat 204/106, heart rate 97, temperature 98, resp 16.  GENERAL APPEARANCE:  She is uncomfortable upon awakening.  Mental status  is normal.  HEENT:  Head normocephalic, atraumatic.  Extraocular muscles intact.  LUNGS:  Clear to auscultation bilaterally.  HEART:  Regular rate and rhythm.  No murmurs, rubs, or gallops.  ABDOMEN:  Epigastrium  tender to palpation.  No guarding, no rigidity,  scant bowel sounds throughout.  GENITAL/RECTAL:  Heme-positive stools.  NEURO:  Nonfocal.   LABORATORY:  Occult blood test positive.  White count 9.3, hemoglobin  11.1, platelets 232.  CMET not yet done.  Stat CBG was 189.  Lipase  elevated at 72.  AST, ALT, alk phos, and bili all within normal limits.   ASSESSMENT AND PLAN:  1. Abdominal pain with nausea, vomiting, and diarrhea, most likely      gastroparesis given her history, could also be a function of her      pancreatitis.  Mildly elevated lipase, will check an amylase, keep      her NPO, hydrate.  We will check CMET, which is not done.  We will      check abdominal imaging, rule out obstruction.  The patient does      have a history of obstruction, but examination shows no rigidity or      guarding.  Urinary tract infection also possible.  UA not yet done.      Consider GI consult if worsening hematemesis,  Mallory-Weiss tear      possible with repeated vomiting.  We will treat with Reglan and      Phenergan.  2. Pancreatitis:  Mildly elevated lipase with epigastric pain.  No      elevated white count.  CBG less than 200.  CMET not yet done.  We      will order an LDH and amylase.  Consider imaging if pain increases      or exam worsens.  Again, no EtOH history.  3. Diabetes:  The patient is historically not well controlled.      Incredibly, no CMET or CBG was ordered here in the ED.  Stat CBG      shows 189; CMET pending.  We will keep her n.p.o. with sensitive      sliding scale coverage.  As she starts to take p.o., this will need      to be adjusted.  4. FEN/GI:  NPO.  The patient __________ 2 liters.  Will do IV fluids      at 150 normal saline.  CMET:  Potassium not known.  We may need      potassium and fluids.  5. Hypertension:  Blood pressure medications held given n.p.o. status,      will treat with Lopressor 10 mg IV x1 now.  If blood pressure      persists, after pain controlled, we will need to schedule IV blood      pressure medications.  The patient denies chest pain, headache, or      dizziness at this time.      Briscoe Deutscher Smith Mince, M.D.     MBV/MEDQ  D:  05/05/2007  T:  05/05/2007  Job:  [id number]

## 2011-05-11 NOTE — Cardiovascular Report (Signed)
NAME:  [name] [name], [name] [name]               ACCOUNT NO.:  [id number]   MEDICAL RECORD NO.:  [id number]          PATIENT TYPE:  INP   LOCATION:  6740                         FACILITY:  [facility]   PHYSICIAN:  Shelva Majestic, M.D.     DATE OF BIRTH:  07/23/1966   DATE OF PROCEDURE:  08/18/2007  DATE OF DISCHARGE:                            CARDIAC CATHETERIZATION   PROCEDURES:  Left heart catheterization with cine coronary angiography,  left ventriculography and distal aortography.   INDICATIONS:  [name] is a 45 year old, African American  female who has a 20-year history of type 2 diabetes mellitus.  She was  admitted to [facility] yesterday hypertensive with subxiphoid  chest pressure/burning.  She was seen by Dr. Melvern Banker.  Due to her  longstanding history of diabetes, hypertension with borderline elevation  of troponin, definitive cardiac catheterization was recommended.   DESCRIPTION OF PROCEDURE:  After premedication with Versed 2 mg  intravenously, the patient was prepped and draped in the usual fashion.  The right femoral artery was punctured anteriorly and a 5-French sheath  was inserted.  Diagnostic catheterization was done utilizing 5-French,  Judkins-4, left and right coronary catheters.  A pigtail catheter was  used for RAO ventriculography.  Distal aortography was performed without  difficulty.  The patient tolerated the procedure well.   HEMODYNAMIC DATA:  Central aortic pressure was 90/60.  Left ventricular  pressure was 90/2.   ANGIOGRAPHIC DATA:  Left main coronary artery was angiographically  normal and essentially trifurcated into an LAD, an intermediate vessel  and a codominant left circumflex system.   The LAD was angiographically normal and gave rise to one diagonal  vessel, several septal perforating arteries and wrapped around the LV  apex.   The intermediate vessel was a moderate size vessel which bifurcated and  was angiographically normal.   The circumflex vessel gave rise to three additional marginal vessels and  posterolateral vessel and was normal.   The right coronary artery was angiographically normal and gave rise to a  PDA and small PLA system.   RAO ventriculography revealed normal LV contractility.  There was  suggestion of left ventricle hypertrophy.  There was also a suggestion  of borderline mitral valve prolapse.  Distal aortography did not  demonstrate any renal artery stenosis or any significant aortoiliac  disease.   IMPRESSION:  1. Normal left ventricular function with evidence for left ventricle      hypertrophy and possible mild mitral valve prolapse.  2. Essentially normal coronary arteries.  3. No evidence for renal artery stenosis.   RECOMMENDATIONS:  Ms. Covault chest pain most likely is nonischemic.  Other etiologies will be investigated.  She will be hydrated vigorously  following her cardiac catheterization.           ______________________________  Shelva Majestic, M.D.     TK/MEDQ  D:  08/18/2007  T:  08/19/2007  Job:  [id number]   cc:   Bryson Dames, M.D.

## 2011-05-11 NOTE — H&P (Signed)
[name] [name], MARANDOLA               ACCOUNT NO.:  [id number]   MEDICAL RECORD NO.:  [id number]          PATIENT TYPE:  INP   LOCATION:  [id number]                         FACILITY:  [facility]   PHYSICIAN:  Blane Ohara McDiarmid, M.D.DATE OF BIRTH:  Mar 14, 1966   DATE OF ADMISSION:  01/24/2009  DATE OF DISCHARGE:                              HISTORY & PHYSICAL   PRIMARY CARE PHYSICIAN:  [name] Boos, MD   CHIEF COMPLAINT:  Persistent vomiting.   HISTORY OF PRESENT ILLNESS:  The patient is a 45 year old female with a  history of cyclic vomiting syndrome and diabetes mellitus type 2,  presented to the emergency department with persistent vomiting since  4:30 a.m. that woke her up from sleep and also epigastric abdominal  pain.  The patient's pain and symptoms are similar to previous  exacerbations of her cyclic vomiting syndrome and abdominal migraines.  She has been taking her usual Ativan, Phenergan, Reglan, and Vicodin as  directed.  She was seen in clinic on January 15, 2009, and January 16, 2009, all time she was given Phenergan 50 mg and morphine 10 mg IM.  She  stated she was improving by January 17, 2009, and then the symptoms  completely resolved over that weekend.  She currently feels dehydrated  and has been taking sips of fluids as requesting ice chips now.  She  still feels nauseous.  No diarrhea or bloody stools.  She does have  intermittent hematemesis with her episodes and she does admit to having  had this today.  She has had no fevers.   PAST MEDICAL HISTORY:  1. Cyclic vomiting syndrome.  2. Abdominal migraines.  3. Diabetes mellitus type 2.  4. Hypertension.  5. Gastroesophageal reflux disease.  6. Depression.   MEDICATIONS:  1. Aspirin 81 mg daily.  2. Reglan 5 mg q.a.c. and nightly.  3. Ativan 1 mg q.6 h p.r.n. nausea.  4. Phenergan 25 mg tablets q.4 h as needed for nausea.  The patient      may substitute IM injection or suppository for tablet.  5. Topamax 50 mg  b.i.d.  6. Lantus 15 units daily.  7. MiraLax 17 g as needed in 8 ounces of fluid daily for constipation.  8. Metformin 1 g b.i.d.  9. Vicodin 5/500 1-2 tabs every 6 hours as needed.  10.Coreg 25 mg b.i.d.   ALLERGIES:  1. PERCOCET  2. ERYTHROMYCIN.   FAMILY HISTORY:  Positive for diabetes in first-degree relatives, mother  had an MI in April 2004.   SOCIAL HISTORY:  The patient is currently a smoker, no alcohol or drug  use, though has remote marijuana use.  She lives alone currently, works  in [employer], but lot of pressure on her from her job due to missing  for illness.   REVIEW OF SYSTEMS:  As per HPI above.   PHYSICAL EXAMINATION:  VITAL SIGNS:  Temperature 97.7, pulse 74,  respirations 18, blood pressure 213/100, though improved with IV fluids  and medications.  Pulse ox 100% on room air.  GENERAL:  Well-developed, well-nourished, appears ill, but nontoxic.  HEENT: Normocephalic  and atraumatic, no conjunctival injection.  Extraocular movements intact.  Tympanic membranes are white and glossy  with good light reflex.  Pharynx is without erythema or exudate.  Mucous  membranes are dry.  NECK:  No lymphadenopathy, thyromegaly, or other masses felt.  LUNGS:  Clear to auscultation bilaterally.  No wheezes, rales, or  rhonchi.  CARDIOVASCULAR:  Regular rate and rhythm.  S1 and S2 normal.  No  murmurs, rubs, or gallops.  ABDOMEN:  Decreased bowel sounds throughout moderate epigastric  tenderness without rebound or guarding.  No hepatosplenomegaly.  EXTREMITIES:  Warm and well perfused.  No edema.  SKIN:  No rashes.   LABORATORY DATA AND STUDIES:  White blood cell count 7.6, hemoglobin  11.7, hematocrit 34.8, platelets 232, lipase 40, INR 1.0.  Sodium 138,  potassium 3.3, chloride 109, bicarb 21, glucose 191, BUN 14, creatinine  1.08, calcium 8.0 protein 7.2, AST 16, ALT 9, albumin 3.9.  KUB showed  no active disease or free air.  There is stool in the proximal colon.    ASSESSMENT AND PLAN:  A 45 year old female with  1. Persistent vomiting consistent with exacerbations of cyclic      vomiting syndrome and we will treat as such.  We will place on      Ativan 1 mg IV q.6 h and Reglan 5 mg IV q.6 h as scheduled.      Morphine 4 mg IV q.4 h as needed, Phenergan 25 mg IV or IM q.6 h      p.r.n., and Zofran 8 mg IV q.4 h p.r.n. for nausea.  Bolus 1 L of      normal saline in the emergency department and started on 250 per      hour of normal saline.  We will continue with 200 mL per hour of      normal saline, but add potassium given that she is hypokalemic.  We      will check an EKG to ensure no changes given that she is diabetic.      If there are any changes, we will order cardiac enzymes.  We will      hold aspirin at this time.  Protonix 40 mg IV b.i.d.  N.p.o. now      except for meds and keeping ice chips down, fully transition to      clear liquid diet.  2. Generalized abdominal pain.  Morphine as needed for pain.  No      rebound or guarding.  KUB is essentially negative.  Exam is similar      to previous one, lab done on her for this issue.  We will continue      to monitor.  3. Hypertension.  We will continue home Coreg 25 mg b.i.d.  Blood      pressure is much improved with IV fluids.  We will monitor while      she is in the hospital.  4. Diabetes mellitus type 2.  We will hold her metformin and Lantus.      I will start sensitive sliding scale, CBGs      q.4 h.  5. Gastroesophageal reflux disease.  Protonix 40 mg IV b.i.d.  6. Hypokalemia.  We will replete with 40 mEq K-Dur p.o. and add 20 mEq      of KCl to each bag of IV fluids.      Karlton Lemon, M.D.  Electronically Signed      Blane Ohara McDiarmid, M.D.  Electronically Signed    SH/MEDQ  D:  01/25/2009  T:  01/26/2009  Job:  [id number]

## 2011-05-11 NOTE — Discharge Summary (Signed)
NAME:  [name] [name], [name] [name]               ACCOUNT NO.:  [id number]   MEDICAL RECORD NO.:  [id number]          PATIENT TYPE:  INP   LOCATION:  3037                         FACILITY:  [facility]   PHYSICIAN:  [name], M.D.DATE OF BIRTH:  [birth year]   DATE OF ADMISSION:  02/10/2009  DATE OF DISCHARGE:  02/12/2009                               DISCHARGE SUMMARY   PRIMARY CARE PHYSICIAN:  [name], MD, [place]   DISCHARGE DIAGNOSES:  1. Cyclic vomiting syndrome.  2. Diabetes mellitus type 2.  3. Hypertension.  4. Depression.  5. Anxiety.  6. Tobacco dependence.  7. Gastroesophageal reflux disease.   DISCHARGE MEDICATIONS:  1. Aspirin 81 mg p.o. daily.  2. Reglan 5 mg before meals and at bedtime p.o.  3. Lorazepam 1 mg q.6 h. p.r.n. anxiety.  4. Topamax 50 mg p.o. b.i.d.  5. MiraLax 1 pack p.o. daily p.r.n. constipation.  6. Phenergan 25 mg p.o. q.4 h. p.r.n. nausea.  7. Hydrocodone/acetaminophen 5/500 mg 1-2 tablets q.6 h. p.o. p.r.n.      pain.  8. Coreg 25 mg p.o. b.i.d.  9. Flexeril 5 mg p.o. t.i.d. p.r.n. pain.  10.Senokot-S 2 tablets p.o. p.r.n. constipation.  11.Lantus 15 units subcu each day.   DISCONTINUED MEDICATIONS:  None.   CONSULTATIONS:  None.   LABORATORY DATA:  CBC on admission is as follows.  White blood cells  7.6, hemoglobin 11.8, hematocrit 34.5, and platelets 207.  Electrolytes  on admission are as follows:  Electrolytes within normal limits except  for a glucose of 161.  LFTs within normal limits.  CBC on discharge is  as follows:  White blood cells 5.6, hemoglobin 9.6, hematocrit 27.8, and  platelets 164.  Electrolytes on discharge are as follows:  Electrolytes  within normal limits except for glucose of 155.  CBGs during hospital  course ranged from a low of 93 to a high of 239, however, mostly in the  low 100s for the patient's hospital course.   STUDIES:  None.   BRIEF SYNOPSIS:  This is a 45 year old female with past  medical history  significant for cyclic vomiting syndrome, diabetes mellitus type 2 who  presented with worsening abdominal pain, emesis.   PROBLEMS:  1. Abdominal pain, emesis.  The patient presented with abdominal pain      and emesis consistent with her prior admissions for cyclic vomiting      syndrome decompensation.  The patient initially presented with      above symptoms which had started 8 hours prior to admission.  The      patient received morphine 10 mg and Phenergan 50 mg in the [facility]      office without relief.  The patient was initially made n.p.o.,      started on scheduled Reglan with Zofran and Phenergan p.r.n.  The      patient's metformin and Lantus were initially held while the      patient was n.p.o. with.  The patient was rehydrated with normal      saline via IV.  On day #2 of  hospital course, the patient's emesis      had resolved, however, the patient still had some nausea.  The      patient's diet was advanced and the patient tolerated p.o. intake      well without emesis, though still had some post meal nausea.  It      was suggested to the patient that she take her Zofran prior to      eating given the onset of action of Zofran and this seemed to help      her nausea significantly.  The patient was discharged on date as      above with improvement in her nausea and emesis.  The patient will      be restarted on her metformin and Lantus upon discharge.  2. Diabetes mellitus type 2.  The patient's metformin and Lantus were      initially held while the patient was n.p.o. as described above.      The patient was restarted on her metformin on day #1 of      hospitalization given increased p.o. intake, however, the patient's      Lantus was held as the patient was not eating full meals.  The      patient was continued on sliding scale during this time and will be      restarted on Lantus at her home dose on discharge.  The patient      with CBGs as above during  hospital course.  3. Hypertension.  The patient with initial presenting blood pressure      of 179/98, however, during hospital course the patient's blood      pressures ranged from 114-130/67-74 and were stable.  The patient      was continued on her home dose of Coreg during the course of her      hospitalization.  4. Depression.  This problem was stable for the patient during the      course of her hospitalization.  5. Anxiety.  The patient was continued on home dose of lorazepam      during the course of her hospitalization.  This problem was stable      for the patient as well and the patient's anxiety improved      significantly with improvement in her nausea and vomiting.  6. Tobacco dependence.  This problem was stable for the patient during      the course of her hospitalization.  7. GERD.  The patient was started on IV Protonix as above.  This was      transitioned to p.o. Protonix during the course of the patient's      hospitalization as she was able to tolerate p.o. intake.  The      patient can follow up in the outpatient setting regarding a      medication for her GERD.  The patient did not have any significant      symptoms of GERD during the course of her hospitalization.   FOLLOWUP:  The patient will follow up with Dr. [name] at [facility] on February 19, 2009 at 3:30 p.m.   DISCHARGE INSTRUCTIONS:  The patient was given instructions regarding  returning to [facility] if her nausea or emesis  worsened or any other concerns that she may have.   DISPOSITION:  The patient was discharged to home in stable and improved  condition.      Mariana Arn,  MD  Electronically Signed      Jamal Collin. Andria Frames, M.D.  Electronically Signed    KC/MEDQ  D:  02/16/2009  T:  02/17/2009  Job:  [id number]

## 2011-05-11 NOTE — H&P (Signed)
NAME:  [name] [name], [name] [name]               ACCOUNT NO.:  [id number]   MEDICAL RECORD NO.:  [id number]          PATIENT TYPE:  INP   LOCATION:  [id number]                         FACILITY:  [facility]   PHYSICIAN:  Dickie La, MD        DATE OF BIRTH:  June 28, 1966   DATE OF ADMISSION:  11/22/2007  DATE OF DISCHARGE:  11/21/2007                              HISTORY & PHYSICAL   PRIMARY CARE PHYSICIAN:  Dr. [name].   CHIEF COMPLAINT:  Sick-like with vomiting syndrome.   HISTORY OF PRESENT ILLNESS:  Ms. Schmitter is a 45 year old female, well  known to our service, with a history of diabetic gastroparesis/sick-like  vomiting syndrome, who presents with a 1 week history of nausea,  vomiting and epigastric pain.  She states that this episode is similar  to her other episodes, although the abdominal pain is worse.  She has  been vomiting almost every hour since last week and has not been able to  keep food or liquids down.  She does have some blood streaks in her  emesis today.  Last bowel movement was 3 days ago.  She has been to our  clinic and the ED 3 times over the past week with these symptoms,  received Phenergan and morphine and sent home.  Sometimes they are only  temporary alleviated.  She has not taken any ibuprofen.   She had a normal gastric emptying study in June of this year.  She was  also started on Topamax this month as her primary medical doctor thought  it might helped with her symptoms.  She states it has helped  temporarily.   Ms. Kozan is also very depressed.  She has a history of depression,  worse with the holidays in her illness.  She is not suicidal, but does  state she does not know how much longer she can take this.   PRIOR MEDICATIONS:  1. Aspirin 81 mg 1 tablet daily.  2. Lisinopril/HCTZ 20/12.5 one tablet daily.  3. Metformin 850 mg 1 tablet p.o. b.i.d.  4. Prilosec 20 mg 1 capsule daily.  5. Reglan 10 mg 1 tablet every 6 hours.  6. Flexeril 10 mg 1/2 tablet to 1  tablet by mouth t.i.d..  7. Lorazepam 1 tablet every 8 hours for sign of nausea.  8. Phenergan 1 tablet every 4 hours for sign of nausea.   ALLERGIES:  1. PERCOCET.  2. AZITHROMYCIN.   PAST MEDICAL HISTORY:  1. Diabetes with history of diabetic foot ulcer.  2. Sick-like vomiting syndrome with normal gastric emptying study      taken in 2008.   PAST SURGICAL HISTORY:  1. Cholecystectomy in 2002.  2. Total abdominal hysterectomy in 2003.   FAMILY HISTORY:  Diabetes first degree relative.  Also, mother died of  an MI in 2003/05/22.   SOCIAL HISTORY:  Ms. Porcayo has 4 children, 2 sons who are twins who are  52, an 53 year old son and a 106 year old daughter.  Her daughter lives  with her.  She is currently not working and she lives with her  husband.  She does smoke 1 to 2 or 3 cigarettes per day and denies alcohol or drug  use.  Of note, she did have a suicide attempt in the winter of 2003.   REVIEW OF SYSTEMS:  See HPI.  Positive for loss of appetite.  Postoperative to abdominal pain, nausea, vomiting and blood streaked  emesis.   PHYSICAL EXAMINATION:  VITAL SIGNS:  Temperature is 99.  Blood pressure  is 179/102.  Pulse is 126.  GENERAL:  In moderate distress, actually vomiting while being examined.  Emesis clear with blood streaks.  HEENT:  Eyes:  No scleral icterus.  LUNGS:  Normal respiratory effort.  Chest expands symmetrically.  Lungs  are clear to auscultation without cracks or wheezes.  HEART:  Normal rate and rhythm.  S1, S2 normal without gallop, murmur,  click, rub or other extra sounds.  ABDOMEN:  Bowel sounds hypoactive.  Significant epigastric tenderness  with guarding.  PSYCH:  She is oriented x2 with a depressed affect.   ASSESSMENT/PLAN:  A 45 year old with:  1. Nausea and vomiting.  This is an established problem, consistent      with her prior admissions for sick-like vomiting syndrome.  We will      admit for fluid hydration and pain management.  Bolus  IV fluids and      start D5 1/2 normal saline while she remains n.p.o.  Phenergan and      morphine as needed.  Also give Lorazepam 1 or 2 mg every 1 or 2      hours and Zofran for sick-like vomiting.  We will also check lipase      to rule out pancreatitis.  Check CMET to rule out biliary and      hepatic issues.  2. Hypertension.  Established problem.  Uncontrolled at this time,      secondary to pain and inability to take medications due to      vomiting.  We will start metoprolol 5 mg IV as needed for blood      pressure greater than 180/110.  3. Diabetes mellitus.  Complicated.  Established problem.  We will      hold her home medications and start a sliding scale insulin.  We      will also give glucose and IV fluids as she is n.p.o.  Hemoglobin      A1c last month was 6.0.  We will not check at this time.  4. Tobacco dependence.  We will start patches as she has not smoked in      several days and only smokes a few cigarettes a day.  We will order      a smoking cessation consult.      Arnette Norris, M.D.  Electronically Signed      Dickie La, MD  Electronically Signed    TA/MEDQ  D:  11/23/2007  T:  11/23/2007  Job:  [id number]

## 2011-05-11 NOTE — Discharge Summary (Signed)
NAME:  [name] [name], [name] [name]               ACCOUNT NO.:  [id number]   MEDICAL RECORD NO.:  [id number]          PATIENT TYPE:  INP   LOCATION:  6729                         FACILITY:  [facility]   PHYSICIAN:  Talbert Cage, M.D.DATE OF BIRTH:  June 14, 1966   DATE OF ADMISSION:  05/09/2008  DATE OF DISCHARGE:  05/12/2008                               DISCHARGE SUMMARY   REASON FOR HOSPITALIZATION:  Relapse of cyclic vomiting syndrome.   SIGNIFICANT FINDINGS AND BRIEF HOSPITAL COURSE:  The patient is a 45-  year-old female with diabetes type 2, gastroesophageal reflux disease,  and cyclic vomiting syndrome with multiple past hospital admissions with  these conditions who presents with a relapse of cyclic vomiting that was  refractory to treatment in the emergency department.  Urinalysis on  admission showed 15 ketones and greater than 1000 glucose, protein was  100, but was otherwise negative.  Urine microscopy was normal except for  many bacteria and many squamous epithelial cells.  Complete metabolic  panel on admission was significant for glucose of 346 but was otherwise  within normal limits.  Lipase on admission was slightly elevated at 63.  CBC on admission showed white blood cell count of 10.2, hemoglobin of  12.7, and platelet count of 261 with 69% neutrophils.  CBC on May 11, 2008, day prior to discharge showed a hemoglobin of 9.6, white blood  cell count of 6.3, and a platelet count of 193.  Hemoglobin and  hematocrit on May 12, 2008, showed hemoglobin of 9.3 and hematocrit was  stable at 27.8.  Given the vigorous fluid resuscitation that the patient  received in the hospital, it was thought that her anemia is most likely  due to dilution but the patient does carry a past medical history of  anemia.  Her vital signs remained stable throughout her admission and  she did not require any consideration for possible blood transfusion.  The patient was treated with IV Phenergan and Zofran  and was also placed  on IV Dilaudid 1 mg every 4 hours initially and then transitioned to  oral Dilaudid 4 mg p.o. q.4 hours as needed.  The patient tolerated this  regimen well and once admitted to the floor actually had no more  episodes of vomiting.  We transitioned her from clear liquids to solids,  and day prior to discharge she was able to eat 100% of her meals  consisting of solid food.  The patient was somewhat constipated during  this hospitalization and received Fleet enema x 1 with good stool output  from this.  On May 12, 2008, the patient had no further episodes of  vomiting, nausea was controlled, and she desired discharge to home.  Vital signs remained stable, and the patient was discharged to home on  her home regimen of medications and home regimen for cyclic vomiting  syndrome.  While in the hospital Palliative Care was consulted and  recommended consideration for starting Thorazine liquid as needed for  vomiting.  This was initially written for in the hospital but pharmacy  did not carry this compound of medication.  Consideration  for this  further should be made as an outpatient.  Furthermore Palliative care  recommended Remeron to help with nausea, vomiting, and other  gastrointestinal symptoms as well as treatment of depression with  discontinuation of nortriptyline.  This medication change was initiated  during this hospital stay, and the patient is discharged on Remeron and  also nortriptyline.  Please note the palliative care was consulted not  for palliative care issues, but for recommendations with pain control  and control of her cyclic vomiting symptoms.  The patient is not a  candidate for palliative care services or hospice by any means at this  time.   CONSULTATIONS:  Palliative care.   PROCEDURES:  None.   DISCHARGE MEDICATIONS:  1. Aspirin 81 mg p.o. daily.  2. Metformin 850 mg p.o. twice daily.  3. Prilosec 20 mg p.o. daily.  4. Reglan 5 mg p.o.  q.6 hours.  5. Remeron is started at 15 mg p.o. once daily for 2 weeks, this      should be increased to 30 mg after 2 weeks.  6. Ativan 1 mg every 8 hours as needed at the first sign of nausea.  7. Phenergan 25 mg 1 tablet p.o. q.4 hours at the first sign of      nausea.  8. Topamax 50 mg p.o. twice daily.  9. Coreg 12.5 mg p.o. twice daily.  10.Prozac 20 mg p.o. daily.  11.Lantus 12 units injected daily.  12.MiraLax daily as needed, titrate to 1 soft bowel movement per day.  13.Lexapro 5 mg p.o. 3 times daily as needed  14.Dilaudid 4 mg tablet 1-1/2 tablet every 4 hours p.o. as needed.  15.Lisinopril 20 mg p.o. once daily.  16.Phenergan 25 mg suppository per rectum every 6 hours as needed if      not able to take oral Phenergan.   DISCHARGE INSTRUCTIONS:  1. The patient is to stop taking nortriptyline.  The patient is to      start taking Remeron 15 mg tablet once daily p.o. for 2 weeks and      she is then to start taking Remeron 30 mg p.o. once daily      thereafter.  2. The patient is to follow up with family practice in 1-2 weeks with      Dr. [name] or in the hospital or followup clinic.  3. The patient is provided with a work excuse for Tuesday May 14, 2008, as she stated that she had disability meeting regarding her      chronic relapsing medical condition of cyclic vomiting syndrome.      This information will be given to her at discharge.   DISCHARGE DIAGNOSIS:  Relapse of cyclic vomiting syndrome.   OTHER DIAGNOSES:  1. Type 2 diabetes.  2. Gastroesophageal reflux disease.  3. Anemia.  4. Chronic back pain.  5. Tobacco dependence.  6. Diabetic neuropathy.  7. History of migraines.  8. Hypertension.  9. Depression.  10.Anxiety.  11.Status post cholecystectomy in 2002.   DISCHARGE CONDITION:  Stable, good.   DISPOSITION:  Home.   ISSUES PENDING AT THE TIME OF THIS DICTATION/ISSUES FOR FOLLOW UP:  The  patient's Remeron should be increased to 30 mg  after 2 weeks.  She has  been provided with a prescription for both of 15 mg tablet for 2 weeks  and to 30 mg tablet thereafter.  Consideration should also be made for  possibly using Phenergan IM injections at home  to help treat this  chronic relapsing condition and hopefully decrease the number of  emergency department visits and hospital admissions.  The patient says  that this has been discussed in the past and it is likely a  consideration for this and in the future could be of help.  Consideration should also be made for changing or augmenting the  patient's cyclic vomiting syndrome regimen with oral Thorazine liquid  which might she might be able to take at the onset of vomiting and keep  down.  This is a compounded medication and might be difficult to  obtain from pharmacy, but is probably worth looking into, given the  chronic relapsing nature of the patient's condition.   PRIMARY CARE Zaiya Annunziato:  Dr. [name], [name].      Eugenie Norrie, MD  Electronically Signed      Talbert Cage, M.D.  Electronically Signed    TE/MEDQ  D:  05/12/2008  T:  05/12/2008  Job:  [id number]

## 2011-05-11 NOTE — Assessment & Plan Note (Signed)
[facility] OFFICE NOTE   [name] [name], [name] [name]                      MRN:          [id number]  DATE:07/17/2007                            DOB:          Aug 06, 1966    REFERRING PHYSICIAN:  [name], M.D.   REASON FOR CONSULTATION:  Recurrent nausea and vomiting.   HISTORY:  This is a 45 year old African American female with a history  of diabetes mellitus, diabetic neuropathy, major depression, anxiety,  migraine headaches, chronic tobacco use and recurrent problems with  nausea and vomiting.   She has been hospitalized on multiple occasions with hematemesis. At one  point, she was diagnosed with gastroparesis. She did undergo upper  endoscopy on January 27, 2004 for epigastric pain and hematemesis. That  examination revealed severe ulcerative esophagitis over a 10-cm segment.  She was felt to have reflux disease and gastroparesis. She was treated  with Protonix and metoclopramide. She also underwent upper endoscopy in  February with Dr.  Jim Desanctis. Dr.  Lajoyce Corners found only mild gastritis.  Recent additional evaluations included an abdominal ultrasound in May.  This was normal post cholecystectomy. An acute abdominal series in June  was normal as was a CT scan with contrast. Of interest, recent gastric  emptying study was normal without evidence of gastroparesis. The patient  was treated with proton pump inhibitors as an inpatient, though not as  an outpatient. She denies dysphagia, significant heartburn or abdominal  pain. She does complain of chronic bloating as well as chronic  constipation. She states that MiraLax makes her nauseated and does not  help her bowels move. She states that her diabetes is under reasonable  control. No melena or hematochezia.   PAST MEDICAL HISTORY:  As above.   PAST SURGICAL HISTORY:  None reported.   ALLERGIES:  PERCOCET, ERYTHROMYCIN AND ULTRAM.   CURRENT  MEDICATIONS:  1. Lisinopril 10 mg daily.  2. Hydrochlorothiazide 12.5 mg daily.  3. Flexeril 10 mg daily.  4. Metformin 1000 mg b.i.d.  5. Lantus insulin 12 units subcutaneous q nightly.  6. Iron supplement.  7. Unspecified antidepressant.  8. She also uses Vicodin p.r.n.  9. Phenergan p.r.n.   FAMILY HISTORY:  Negative for gastrointestinal malignancy.   SOCIAL HISTORY:  The patient is single with four children. She lives  alone. She has a tenth grade education. She works intermittently at  [employer]. She smokes. She denies alcohol use.   REVIEW OF SYSTEMS:  Per diagnostic evaluation form.   PHYSICAL EXAMINATION:  Well-appearing female in no acute distress. Blood  pressure 120/80, heart rate 88. Weight is 145.8 pounds. She is 5 feet, 1  inch in height.  HEENT: Sclerae anicteric. Conjunctivae are pink. Oral mucosa intact. No  thrush.  LUNGS:  Are clear.  HEART: Is regular.  ABDOMEN: Soft without tenderness, mass or hernia. No succussion splash.  Good bowel sounds heard.   IMPRESSION:  1. Chronic intermittent problems with nausea and vomiting. This may be      due to reflux disease. Previously diagnosed with gastroparesis      though recent emptying  study normal. Aside from organic causes such      as reflux disease, it is conceivable that this patient may have a      functional component given her psychiatric issues.  2. Chronic constipation.  3. Multiple general medical problems.   RECOMMENDATIONS:  1. Daily proton pump inhibitor therapy.  2. Reflux precautions.  3. Dulcolax for constipation.  4. Office followup in six weeks.     Docia Chuck. Henrene Pastor, MD  Electronically Signed    JNP/MedQ  DD: 07/19/2007  DT: 07/19/2007  Job #: [id number]   cc:   [name], M.D.  Blane Ohara McDiarmid, M.D.

## 2011-05-11 NOTE — Discharge Summary (Signed)
NAME:  [name] [name], [name] [name]               ACCOUNT NO.:  [id number]   MEDICAL RECORD NO.:  [id number]          PATIENT TYPE:  INP   LOCATION:  5501                         FACILITY:  [facility]   PHYSICIAN:  Blane Ohara McDiarmid, M.D.DATE OF BIRTH:  1966/09/20   DATE OF ADMISSION:  05/05/2007  DATE OF DISCHARGE:  05/07/2007                               DISCHARGE SUMMARY   ADDENDUM:  This is to the discharge summary number [id number], dictation  [phone].   Please note the patient also had some mild anemia over the course of her  hospitalization with hemoglobin at the time of discharge stable at 9.5.  She was guaiac and Hemoccult positive, but this was felt to be secondary  to [employer] tears.  She also as noted in the previous discharge  summary has had a recent ETT in February 2008 that just showed chronic  gastritis, no evidence of ulcers, etc.   DISCHARGE MEDICATIONS:  She was started on iron 325 mg twice daily for  this at the time of discharge as well.   So add this to the discharge medications.      [name], M.D.    ______________________________  Blane Ohara McDiarmid, M.D.    AL/MEDQ  D:  05/07/2007  T:  05/08/2007  Job:  [id number]

## 2011-05-11 NOTE — Discharge Summary (Signed)
NAME:  [name] [name], [name] [name]               ACCOUNT NO.:  [id number]   MEDICAL RECORD NO.:  [id number]          PATIENT TYPE:  INP   LOCATION:  6729                         FACILITY:  [facility]   PHYSICIAN:  Blane Ohara McDiarmid, M.D.DATE OF BIRTH:  July 27, 1966   DATE OF ADMISSION:  04/22/2008  DATE OF DISCHARGE:  04/25/2008                               DISCHARGE SUMMARY   REASON FOR ADMISSION:  Abdominal pain, nausea, and vomiting.   DISCHARGE DIAGNOSES:  1. Cyclic vomiting syndrome, exacerbation.  2. Hypertension.  3. Gastroesophageal reflux disease.  4. Migraine history.  5. Insomnia.  6. Urinary tract infection.  7. Type 2 diabetes.  8. Depression.  9. Anxiety.   DISCHARGE MEDICATIONS:  1. Aspirin 81 mg p.o. daily.  2. Lisinopril 20 mg p.o. daily.  3. Prilosec 20 mg 1-2 times daily.  4. Flexeril 2 mg p.o. p.r.n.  5. Amitriptyline 50 mg p.o. daily.  6. Topamax 50 mg p.o. b.i.d.  7. Coreg 12.5 mg p.o. b.i.d.  8. Phenergan 25 mg every 4-6 hours as needed.  9. Zofran 8 mg every 6 hours as needed.  10.Ativan 1 mg every 6 hours for 2 days and then as needed.  11.Lantus insulin 10 units daily until back to her normal diet and      then 12 units daily.  12.Metformin 875 mg twice daily, once, she is feeling back to      baseline.  13.Macrobid 100 mg p.o. b.i.d. x7 days.   CONSULTANT:  None.   PROCEDURE AND STUDIES:  None.   LABORATORY DATA:  On admission, the patient's CBC revealed a white blood  cell count 9.3, hemoglobin 13.7, hematocrit 39.3, and platelet count 194  with a normal differential.  Comprehensive metabolic panel revealed  sodium 139, potassium 4.0, chloride 103, bicarb 23, glucose 282, BUN 16,  creatinine 1.1, total bilirubin 0.6, alkaline phosphatase 99, AST 15,  ALT 10, total protein 7.5, albumin 4.0, and calcium 9.7.  Gastric occult  blood was positive.  Lipase was 24 and pregnancy was negative.  Urinalysis revealed specific gravity of 1.015, pH of 8.5, serum glucose  500, 15 of ketones, small blood and was negative for nitrites or  leukocytes but on microscopic showed many bacteria, 3-6 red blood cells  and few squamous epithelial cells.  Urine culture returned and showed  greater than 100,000 colonies of E-coli which was sensitive to  cefazolin, ceftriaxone, gentamicin, nitrofurantoin, tobramycin, and  Bactrim.  At discharge, CBC revealed white blood cell count 5.7,  hemoglobin 9.7, hematocrit 28, and platelet count 158.  Basic metabolic  panel revealed sodium 139, potassium 3.7, chloride 114, bicarb 21,  glucose 112, BUN 5, creatinine 0.94, and calcium 8.3.   HOSPITAL COURSE:  [name] [name] is a 45 year old female with multiple  hospital admissions for her cyclic vomiting syndrome who presented with  her usual symptoms of nausea, vomiting, and abdominal pain.  Her vomit  was Hemoccult positive, thus she was watched closely during her hospital  stay for significant decrease in her hemoglobin.  It was noted that she  did have a decrease; however, she was  hydrated very aggressively and all  of her cell lines decreased, therefore it was felt to be secondary to  hemodilution.  The patient was started on her usual regimen of IV  Phenergan, Zofran, and Ativan with the Ativan scheduled.  She was also  started on Reglan IV.  She was slowly transitioned over these  medications by mouth on April 24, 2008, and was able to tolerate a diet  and each medications by mouth on that day.  By April 25, 2008, she was  feeling significantly better and was discharged home.  For her other  medical problems, she was continued on her home medications without  changes.  Her diabetes was well-controlled, though was initially started  at 8 units daily and titrated back up towards her normal dose of 12  units daily as her intake improved.  When the patient was in significant  pain, she did have a significantly elevated blood pressures, however, as  her pain improved, her blood  pressures did as well.  She was still  slightly above the goal for diabetic, thus her home medication regimen  may need to be titrated slightly; however, her blood pressure was much  more reasonable once her pain was controlled.   INSTRUCTIONS AND FOLLOWUP:  She is to follow low-sodium heart-healthy  American Diabetic Association diet.  She has no restrictions with regard  to activity.  She is to follow with Dr. [name] on May 06, 2008, at  2:45 p.m.      Patria Mane, MD  Electronically Signed      Blane Ohara McDiarmid, M.D.  Electronically Signed    SA/MEDQ  D:  04/26/2008  T:  04/27/2008  Job:  [id number]

## 2011-05-12 DIAGNOSIS — K30 Functional dyspepsia: Secondary | ICD-10-CM

## 2011-05-12 HISTORY — DX: Functional dyspepsia: K30

## 2011-05-14 NOTE — H&P (Signed)
[name] [name], [name] [name]               ACCOUNT NO.:  [id number]   MEDICAL RECORD NO.:  [id number]          PATIENT TYPE:  INP   LOCATION:  6707                         FACILITY:  [facility]   PHYSICIAN:  Karlton Lemon, M.D.    DATE OF BIRTH:  06-03-1966   DATE OF ADMISSION:  12/08/2006  DATE OF DISCHARGE:  12/10/2006                              HISTORY & PHYSICAL   CHIEF COMPLAINT:  Nausea and vomiting.   HISTORY OF PRESENT ILLNESS:  Patient is a 45 year old female with a  history of diabetic gastroparesis who states that at about 6:00 p.m. on  the day prior to admission, she had eaten dinner and shortly after that  time began to have  nausea, vomiting, and a headache.  She is currently  having emesis a few times per hour.  She states that she has had  hematemesis previously with the episodes over the past day, but does not  have any now.  She has had some episodes of diarrhea that has been  watery in the emergency department, but there is no gross blood.  She  does have a history of similar admits for nausea and intractable  vomiting that was ruled out for other pathology and attributed to  gastroparesis.  She states Phenergan has helped with her nausea and  vomiting, but Zofran has not.  She states that she did not have anything  new over the past 24 hours as far as food is concerned where she could  point to food poisoning.   PAST MEDICAL HISTORY:  1. Diabetes mellitus type 2.  2. Hypertension.  3. Depression.  4. Diabetic neuropathy.  5. Tobacco abuse.  6. Anxiety.  7. Insomnia.  8. History of diabetic gastroparesis.  9. History of pyelonephritis in 2003/04/09.  10.Recurrent UTIs.   PAST SURGICAL HISTORY:  1. Cholecystectomy.  2. Exploratory laparotomy which showed a bowel obstruction in 04/08/01.  3. Total abdominohysterectomy and lysis of adhesion.   MEDICATIONS:  1. Amitriptyline 100 mg q.h.s.  2. Aspirin 81 mg daily.  3. Lisinopril 5 mg daily.  4. Metformin 500 mg in the p.m.,  1000 mg in the a.m.  5. NovoLog 70/30 12 units in the a.m., 60 units in the p.m.  6. Reglan 10 mg p.o. t.i.d. and q.h.s.   ALLERGIES:  1. PERCOCET WHICH CAUSES RASH AND NAUSEA.  2. ERYTHROMYCIN WHICH CAUSES NAUSEA, VOMITING AND A RASH.   FAMILY HISTORY:  Mother died of an MI in April 09, 2003.  Patient also has first-  degree relatives with diabetes mellitus.   SOCIAL HISTORY:  Patient is monogamous and has four children.  Her  daughter currently lives with her.  She also lives with a cousin and has  been out of work.  She denies any alcohol or drug use.  She smokes about  five cigarettes per day and has set a quit date of December 31 with our  clinic.   REVIEW OF SYSTEMS:  GENERAL:  No sick contacts, no fevers.  Positive for  chills and some sweats.  CARDIOVASCULAR:  No chest pain.  PULMONARY:  No  dyspnea.  GI:  Positive for nausea and CHPI as above.  GU:  No dysuria.  SKIN:  Positive for having had an ulceration, pressure calluses in  bilateral feet.   PHYSICAL EXAMINATION:  VITAL SIGNS:  Temperature 97.0, pulse 115, blood  pressure 188/99, respirations 22, pulse ox 99% on room air.  GENERAL:  Patient appears sick, but nontoxic.  There is emesis at bedside, it is  nonbloody and nonbilious.  MENTAL STATUS:  Alert and oriented x3.  HEENT:  Head is atraumatic, extraocular movements intact, pupils equal,  round and reactive to light, tympanic membranes clear.  Pharynx is  normal without erythema or exudates.  CHEST/LUNGS:  No tenderness on chest wall, Port-A-Cath on the right  chest.  She has crackles in bilateral lower lobes, but no rhonchi or  wheezes.  CARDIOVASCULAR:  She is tachycardic, no murmurs, rubs or gallops  appreciated.  ABDOMEN:  Soft with moderate tenderness in the epigastric region without  rebound or guarding, bowel sounds are positive.  EXTREMITIES:  2+ DP pulses and no edema.  NEURO:  Cranial nerves II-XII grossly intact, sensation is intact to  light touch in  bilateral lower extremities.  Strength 5/5 in all  extremities.  SKIN:  Pressure calluses with some breakdown in bilateral feet, but no  open wounds.  BACK:  No CVA tenderness.   LABS AND TESTS:  White blood cell count 815.1 with 83% neutrophils,  hemoglobin 12.5, platelets 322, sodium 138, potassium 4.1, chloride 101,  bicarb 24, BUN 15, creatinine 0.9, glucose 342, bilirubin 1.4, AST 30,  protein 7.7, calcium 9.8, alk phos 67, ALT 19, albumin 4.2, lipase 30,  UA is pending.   She has an abdominal series which shows nothing acute, no bowel  obstruction.   ASSESSMENT/PLAN:  This is a 45 year old female with:  1. Nausea and vomiting:  This is likely secondary to diabetic      gastroparesis and less likely pure gastritis.  We did an abdominal      series to assess for bowel obstruction which does not appear that      she has at this time.  We will admit her for her intractable      vomiting which we believe is likely secondary to her gastroparesis.      Her lipase is normal so there is no pancreatitis.  She has a      history of a cholecystectomy and a common duct stone is very      unlikely.  Viral gastroenteritis is possible with her diarrhea and      vomiting, but she does not have any sick contacts and has not tried      any new foods.  We will monitor, start IV fluids and bolus with      normal saline then start her at one-half normal saline at 125 cc      per hour.  She will be NPO, we will consider an NG tube if she      continues with emesis.  We will start her on Reglan q.8.h.,      Phenergan and Zofran p.r.n.  Start her on Protonix 40 mg IV daily,      to switch to p.o. b.i.d. when she is tolerating p.o.  We will      Gastroccult her emesis.  Her hemoglobin is stable at this time.      Will consider an H. Pylori.  Will Hemoccult her stools and do a  rectal exam.  2. Hypertensive urgency:  Will start Labetalol 20 mg IV x1 then 40 mg     q.10.minutes until her blood  pressure is less than 180/110.      Patient has a normal creatinine, no chest pain, no mental status      changes and good peripheral perfusion.  Will check an EKG and get      serial cardiac enzymes if were are concerned about ischemia on the      EKG.  With her nausea and vomiting, this may be a presentation of      ischemia in a diabetic patient.  Will check a UA for casts and      signs of acute renal failure and acute tubular necrosis, though her      creatinine is normal.  We will continue her lisinopril.  3. Diabetes mellitus type 2:  Will check an A1c.  Her glucose is 342      currently.  Will start her on sliding-scale insulin for now while      she is NPO.  4. Depression:  History of suicidal ideation.  Will continue      amitriptyline 100 mg q.h.s. when she is tolerating p.o.  5. Tobacco abuse:  She has set a quit date for December 31.  Will      start her on a low-dose Nicotine patch if this available.  Smoking      cessation consult.  6. Calluses, sores on bilateral lower extremities:  These are not      painful and likely neuropathic ulcers.  Get a wound care consult to      assess if anything needs to be done with these, as the patient is      normally seen at [facility].  7. Bilateral lower lobe crackles:  Will check a chest x-ray.  This may      be chronic versus aspiration pneumonitis versus IV fluid      resuscitation overload so we will monitor her.  8. Prophylaxis:  SCDs, Protonix.  9. Leukocytosis:  This is likely secondary to stress and      demargination, but we will check a urinalysis and a chest x-ray.           ______________________________  Karlton Lemon, M.D.     SH/MEDQ  D:  12/13/2006  T:  12/14/2006  Job:  [id number]

## 2011-05-14 NOTE — Op Note (Signed)
NAME:  [name] [name], [name] [name]               ACCOUNT NO.:  [id number]   MEDICAL RECORD NO.:  [id number]          PATIENT TYPE:  AMB   LOCATION:  [place]                          FACILITY:  [facility]   PHYSICIAN:  Larrie Kass., M.D.DATE OF BIRTH:  Oct 19, 1966   DATE OF PROCEDURE:  08/13/2005  DATE OF DISCHARGE:                                 OPERATIVE REPORT   PREOPERATIVE DIAGNOSES:  Severe diabetes with phlebosclerosis and difficult  venous access.   POSTOPERATIVE DIAGNOSES:  Severe diabetes with phlebosclerosis and difficult  venous access.   PROCEDURE:  Implantation of central venous access port.   SURGEON:  Georgina Quint, M.D.   ANESTHESIA:  Local with sedation.   PROCEDURE:  After the patient was monitored, sedated, and had routine  preparation and draping of the anterior chest and neck, I liberally infused  local anesthetic in the right deltopectoral groove and medial to that on the  chest wall. With the patient in Trendelenburg position,  I made a small skin  incision in the deltopectoral groove, and accessed the subclavian vein with  a large-bore needle and fee in a J-wire. After confirming position of the  tip of the wire in the atrium, I created a pocket in the deep subcutaneous  tissues of the anterior chest wall and implanted the Bard X-Port device  using two sutures of 2-0 Prolene. I fed the venous tubing through to the  venous access incision, then used fluoroscopy to cut the catheter to  estimated necessary length to reach the superior vena cava. I then dilated  the tract over the wire, passed the dilator and introducer assembly into the  vessel, and withdrew blood through the introducer to confirm intravascular  position. I removed the dilator and passed the venous tubing through the  introducer, and went down with slight resistance under the clavicle; but fed  on into the vein. I peeled away the sheath and then used fluoroscopy to  confirm good position; it appeared to be  in the mid superior vena cava.  There were no kinks or twists in the catheter. The device allowed easy  withdrawal of blood and infusion of solution. I flushed thoroughly with  dilute heparin solution and then filled it with heparin lock solution. I  closed the skin with intracuticular 4-0 Vicryl and Steri-Strips, and applied  an occlusive bandage. The patient tolerated the operation well.      Larrie Kass., M.D.  Electronically Signed     WB/MEDQ  D:  08/13/2005  T:  08/13/2005  Job:  [id number]   cc:   Eliezer Lofts, MD  Fax: [phone]

## 2011-05-14 NOTE — Assessment & Plan Note (Signed)
[facility]   NAME:  [name] [name], [name] [name] NO.:  [id number]   MEDICAL RECORD NO.:  [id number]      DATE OF BIRTH:  14-Mar-1966   PHYSICIAN:  Ricard Dillon, M.D.      VISIT DATE:                                   OFFICE VISIT   CHIEF COMPLAINT:  Review of bilateral callus formation over the first  and fifth metatarsal heads bilaterally.   HISTORY OF PRESENT ILLNESS:  The patient is a lady who has been followed  in this clinic before.  She was last seen in August of 2006 and followed  closely by Dr. Nils Pyle.  She is a type 2 diabetic who has had this  problem before.  At that time she required extensive bilateral  debridements of these areas, eventually was transferred from [place]  into diabetic shoes, and she has done reasonably well.   She tells me that roughly 3 or 4 weeks ago, she started to develop pain  in the first and fifth metatarsal heads on her left foot.  There was a  small open area at the inferior aspect of the callus above the fifth  metatarsal head, which prompted her to seek re-referral here.  She has  not noticed any drainage or fever.  Her diabetes has been followed by  the family practice teaching service.  She is currently is on NovoLog  and metformin.  She also has hypertension.  She is concerned because her  mother was a diabetic who underwent a lower extremity amputation.   Examination limited to the lower extremities.  Her peripheral pulses are  palpable.  Over the first and fifth metatarsal heads bilaterally as well  as over the left fifth digit anteriorly is extensive callus formation.  I did a large amount of debridement of thick, hard callus over the left  fifth and first metatarsal heads.  I was expecting to find an ulcer or  perhaps infection.  I debrided the first metatarsal head on the left  side down to a soft yellowish eschar-like material.  I did not go  further because of pain.  There is severe pain on the  medial aspect of  this callus.  At no time did I find a true wound, although I am  concerned that underneath this there is more than meets the eye  currently.  Similarly over the left fifth metatarsal head, I went down  to a softer necrotic material.  I did not find any evidence of ulcer or  infection.  I did a partial thickness debridement over the right first  metatarsal head as well.  However, this did not seem to be at all  painful.   IMPRESSION:  Diabetic foot wounds.  The pathogenesis of these calluses  is really uncertain.   Her diabetic shoes were examined, and they seem to be offloading the  metatarsal heads but obviously they are not.  I am concerned about the  pain she is experiencing in the left first metatarsal head and the  tenderness to palpation.  I am therefore going to put her in [place]  sandals bilaterally.  I have ordered a CBC and differential, a  sedimentation rate, and an x-ray of the metatarsal heads to  rule out  osteomyelitis.  I have written her a work excuse.  She works at  [employer].  She does not have insurance and has a very limited income,  but of course her limited income excludes her from [place].   After the x-ray results come back and the lab work, we will see her on  Tuesday or Wednesday of next week to see what needs to be done further  in this area.  I am expecting that she will need further debridement.  The areas were debrided as much as I felt comfortable with today, which  removed an extensive amount of skin as described above.           ______________________________  Ricard Dillon, M.D.     MGR/MEDQ  D:  11/25/2006  T:  11/26/2006  Job:  [id number]

## 2011-05-14 NOTE — H&P (Signed)
NAME:  [name] [name], [name] [name]               ACCOUNT NO.:  [id number]   MEDICAL RECORD NO.:  [id number]          PATIENT TYPE:  INP   LOCATION:                               FACILITY:  [facility]   PHYSICIAN:  Billey Chang, M.D.     DATE OF BIRTH:  18-Sep-1966   DATE OF ADMISSION:  06/21/2005  DATE OF DISCHARGE:                                HISTORY & PHYSICAL   PRIMARY CARE PHYSICIAN:  Eliezer Lofts, MD   CHIEF COMPLAINT:  Abdominal pain.   HISTORY OF PRESENT ILLNESS:  Patient is a 45 year old diabetic African-  American female with a history of gastroparesis requiring hospitalization  who presents with 8/10 abdominal pain which the patient describes as a  sensation of knots in her abdomen.  Patient reports that it is worse with  eating and drinking.  Patient is unable to keep down any food.  Reports  10/10 burning pain after she eats.  She denies any hematemesis, dysuria,  frequency, melena, or bright red blood per rectum.  She does have a history  of abdominal surgery, but denies any constipation or symptoms consistent  with a small bowel obstruction.  She reports some diarrhea.   REVIEW OF SYSTEMS:  CONSTITUTIONAL:  She denies any fever, but does report  some chills.  CARDIOVASCULAR:  She denies any chest pain.  PULMONARY:  She  denies any cough, shortness of breath.  GI:  Positive for nausea, vomiting.  Negative for melena, bright red blood per rectum, or hematemesis.  GU:  Negative for dysuria, frequency, or urgency.   PAST MEDICAL HISTORY:  1.  Diabetes mellitus type 2.  2.  Hypertension.  3.  Depression.  4.  Gastroparesis.  5.  Tobacco abuse.  6.  Anxiety.   PREVIOUS PROCEDURES:  Upper endoscopy in 2004/03/28, which showed esophagitis and  confirmed gastroparesis.   MEDICATIONS:  1.  Altace 2.5 mg p.o. daily.  2.  Avandamet 1000/4 in the morning, 500/2 in the evening.  3.  NovoLog 70/30 14 units in the morning, 14 units in the evening.  4.  Phenergan 25 mg p.r.n. nausea/vomiting.  5.   Reglan 10 mg p.o. q.a.c./h.s.  6.  Ultram 50 mg p.o. q.4-6h. p.r.n. pain.  7.  Zelnorm 6 mg p.o. b.i.d.   ALLERGIES:  PERCOCET.   FAMILY HISTORY:  Mother died of an MI in 29-Mar-2003.   SOCIAL HISTORY:  Patient is monogamous.  She has four children, twin sons  64, and 74, daughter 13.  Daughter lives with her.  She lives with a cousin.  She is out of work.  Has history of a 10th grade education.  Denies any  physical abuse, alcohol abuse, drug abuse.  Does have remote history of  marijuana use.   PHYSICAL EXAMINATION:  VITAL SIGNS:  Temperature 98.2, pulse 92, blood  pressure 130/60, weight 130.  GENERAL:  Patient is in moderate distress, unable to remain still.  Mental  status:  Mentating appropriately.  HEENT:  Normocephalic, atraumatic.  Pupils are equal, round, and reactive to  light.  Extraocular movements intact.  TMs, canals clear on  examination.  Nose:  No erythema or exudate in the posterior or oropharynx.  Mucous  membranes are moist and the patient does make tears.  CARDIOVASCULAR:  Regular rate and rhythm.  No murmurs, rubs, or gallops.  PULMONARY:  Clear to auscultation bilaterally.  No wheezes, crackles, rales.  ABDOMEN:  Soft.  Positive bowel sounds.  However, patient is diffusely  tender to palpation.  Pain out of proportion to examination.  Voluntary  guarding.  No rebound.  EXTREMITIES:  No clubbing, cyanosis, edema.  2/4 dorsalis pedis pulses  bilaterally.  There is no tachycardia.  RECTAL:  Deferred.   CBG was obtained in office, was 365.   ASSESSMENT/PLAN:  A 45 year old African-American female with diabetes  mellitus type 2, gastroparesis complicated by gastritis and likely  gastroenteritis.  1.  Gastroparesis.  Will do a clear liquid diet, advance as tolerated.      Phenergan 25 mg intravenous q.24h. and Reglan 10 mg p.o. q.a.c./h.s.      Will try to get tighter CBG control in the hospital.  2.  Diabetes mellitus type 2.  Will hold the Avandamet for now given  her      nausea and vomiting.  Will continue NovoLog 70/30 14 q.a.m. and 14      q.p.m.  Will titrate if needed.  Will start sliding scale insulin with      q.a.c./h.s. Accu-Cheks.  3.  Pain.  Will get a CBC with differential, CMP, and lipase.  Also check a      KUB to rule out obstruction and free air.  Will give Protonix      intravenous b.i.d. and consider sucralfate.  Consider also guaiacking      her stools.  Will use p.r.n. Vicodin for pain.  4.  Hypertension.  Will hold her Altace for now given her nausea and      vomiting.  Resume blood pressure control as needed.  5.  Irritable bowel.  Zelnorm 6 mg p.o. b.i.d. continue.  6.  Dehydration.  Will bolus with 1 L of normal saline, then begin D5 half      normal saline of 20 of KCl thereafter at 100 mL per hour.     ______________________________  Cammie Mcgee. Pedro Earls, MD    ______________________________  Billey Chang, M.D.    WTP/MEDQ  D:  06/21/2005  T:  06/21/2005  Job:  [id number]

## 2011-05-14 NOTE — H&P (Signed)
[place]  Patient:    [name] [name], [name] [name] Visit Number: [id number] MRN: [id number]          Service Type: GYN Location: [place] Attending Physician:  Edmonia Caprio Dictated by:   Pamalee Leyden, M.D. Admit Date:  02/23/2002 Discharge Date: 02/23/2002                           History and Physical  HISTORY OF PRESENT ILLNESS:  The patient, a 45 year old African American gravida 7, para 4-0-3-4, who presented to the clinic after referral from internal medicine approximately one month ago because of painful pelvis.   The patient was found to have large, painful, degenerating fibroids that were up to the umbilicus, exquisitely tender on palpation.  The patient had consultation and has opted for total abdominal hysterectomy, to preserve the ovaries if possible.  She is an insulin-dependent diabetic and is on medication for hypertension.  Because of the severity of the pain, the scheduling of the patient will be speeded up as much as possible.  IMPRESSION:  Degenerating fibroids with pelvic pain and menorrhagia. Dictated by:   Pamalee Leyden, M.D. Attending Physician:  Edmonia Caprio DD:  03/08/02 TD:  03/09/02 Job: [id number]

## 2011-05-14 NOTE — Assessment & Plan Note (Signed)
[facility]   NAME:  [name] [name], [name] [name]               ACCOUNT NO.:  [id number]   MEDICAL RECORD NO.:  [id number]      DATE OF BIRTH:  [birth year]   PHYSICIAN:  Joneen Boers A. Nils Pyle, M.D. VISIT DATE:  11/30/2006                                   OFFICE VISIT   VITAL SIGNS:  Blood pressure is 130/82, respirations 18, pulse rate 78,  and she is afebrile.   PURPOSE OF TODAY'S VISIT:  Ms. Thorpe is a 45 year old diabetic who has  been seen in the clinic on previous occasions with her main problem  being that of calluses on the volar aspect of her feet.  She had been  treated with custom inserts for over a year, but has not had those  readjusted and has not really been followed by a podiatrist.  Her  medical physician is Dr. Marisue Humble.  She was referred and evaluated by Dr.  Dellia Nims one week ago.  She re-presents complaining of persistent pain.  She is wearing a Darco wedge sandal on both feet.  She denies drainage,  but has been out of work for a week, per Dr. Dellia Nims.   WOUND EXAM:  Inspection of her feet shows that there has been a pairing  of the callus from the mid head of the first and the fifth on the left  foot.  There is no evidence of ulceration.  On the right foot, there are  very thickened calluses at the first and the fifth metatarsal head with  subdermal hemorrhages.  These were sharply excised, disclosing small  Wagner grade 2 ulcerations without inflammation.  There was no extension  into the joint and there was no involvement of tendon or bone.  There  was no malodor.  Her dorsalis pedis pulses are 3+ bilaterally and  protective sensation is preserved.   DIAGNOSIS:  Neuropathic ulcerations related to diabetes and malfitting  shoes.   MANAGEMENT PLAN & GOAL:  We have given the patient a prescription for  custom shoes and inserts.  We have changed her from the Darco wedges to  the flat Darco sandals with off-loading of the metatarsal heads.  In  addition, the  social history discloses that the patient works at  [employer] for minimum wages.  We, therefore, recommended her to the  stocking fund for 100% assistance with her procuring of the  appropriate footwear.  Once she has healed, we are recommending that she  retain a podiatrist or create a relationship with a podiatrist for  ongoing foot care, as we think that the current ulcerations are directly  related to inadequate off-loading and improper footwear.  We have  explained this approach to the patient in terms that she seems to  understand.  She expressed gratitude for having been seen in the clinic  and indicates that she will be compliant.           ______________________________  Epifania Gore. Nils Pyle, M.D.     Rondel Oh  D:  11/30/2006  T:  11/30/2006  Job:  [id number]   cc:   Modena Jansky. Marisue Humble, M.D.

## 2011-05-14 NOTE — Discharge Summary (Signed)
[place]  Patient:    [name] [name], ATTIG Visit Number: [id number] MRN: [id number]          Service Type: MED Attending Physician:  Fayrene Fearing Dictated by:   Mamie Laurel, M.D. Admit Date:  05/12/2002 Discharge Date: 05/16/2002                             Discharge Summary  DATE OF BIRTH:  December 01, 1966  DISCHARGE MEDICATIONS:  1. Glucotrol XL 10 mg p.o. b.i.d.  2. Protonix 40 mg p.o. q.d. x30 days.  3. Pamelor 100 mg q.d.  4. Ramipril 10 mg q.d.  5. Ativan 0.5 mg p.o. q.h.s. p.r.n.  6. Miralax 1 heaping teaspoon in 8 ounces of water q.d.  7. Neurontin 600 mg t.i.d.  8. Glucophage 1000 mg b.i.d.  9. Propanolol 20 mg b.i.d. 10. Vitamin B6 one tab q.d. 11. Ibuprofen 600 mg t.i.d. p.r.n. 12. Tylenol 1000 mg q.i.d. p.r.n.  DIET:  Normal diabetic diet.  DISCHARGE INSTRUCTIONS:  No alcohol, no drugs.  FOLLOWUP:  With me, Mamie Laurel, M.D., on Tuesday, May 22, 2002, at the [facility] [place] sometime between 8:30 and 10:30 a.m., she was given the phone number.  She was also given the phone number for [facility] as well as the [employer].  DISCHARGE DIAGNOSES: 1. Duodenitis. 2. Diabetes. 3. Anxiety and depression. 4. Hypertension. 5. Peripheral neuropathy secondary to diabetes. 6. Difficult social situation.  HOSPITAL COURSE:  #1 - This is a 45 year old African-American patient of Lucy Antigua of the [facility] who was admitted for epigastric pain as well as leg pain.  The patient was evaluated for epigastric pain after negative plain films.  The patient underwent a barium swallow which she was found to have duodenitis and a small hiatal hernia.  The patient was treated with IV fluids, Phenergan, proton pump inhibitors which improved her.  She is discharged home on proton pump inhibitors.  If her abdominal pain does not resolve in the future, we may need to think about  gastroparesis, and start Reglan as well as a gastric emptying study.  She also takes a great deal of ibuprofen.  We asked her to cut down on this a little bit except as needed for her leg pain.  #2 - DEPRESSION:  The patient has multiple social issues, including recent postoperative, difficult boyfriend, loss of job secondary to her postoperative status and illnesses.  These were dealt here with the case manger who got her set up with various social services by getting her medications.  We will stop the Effexor at this time and switch her to Pamelor which will provide depression coverage as well as peripheral neuropathy coverage.  #3 - DIABETES:  CBGs were a little high here.  So, we doubled her Glucophage as well as continued her on her Glucotrol.  #4 - HYPERTENSION:  Increased her ACE inhibitor, as well as added propanolol for both the anxiety as well as the beta blockade with hypertension and tachycardia.  #5 - ABDOMINAL WOUNDS:  These were kept clean and dry and are well healing. They are not infected.  #6 - PERIPHERAL NEUROPATHY:  The patient underwent an EMG and nerve conduction study which was found to have neuralgia paresthetica which is not related to her paraspinal pain.  She will be started on Neurontin maximum t.i.d. at 600 mg for this.  #7 - CONSTIPATION:  The patient has been constipated since her surgeries, tried multiple medications, but found that Miralax 5 daily will give her regular bowel movements. Dictated by:   Mamie Laurel, M.D. Attending Physician:  Fayrene Fearing DD:  05/16/02 TD:  05/19/02 Job: [id number]

## 2011-05-14 NOTE — Op Note (Signed)
[facility]  Patient:    [name] [name], [name] [name] Visit Number: [id number] MRN: [id number]          Service Type: MED Location: [phone] Attending Physician:  Schuyler Amor Dictated by:   Candee Furbish. Bubba Camp, M.D. Proc. Date: 03/21/02 Admit Date:  06/15/2002 Discharge Date: 06/18/2002                             Operative Report  PREOPERATIVE DIAGNOSIS:       Small bowel obstruction.  POSTOPERATIVE DIAGNOSIS:      Small bowel obstruction.  OPERATION:                    Exploratory laparotomy with adhesiolysis.  SURGEON:                      Candee Furbish. Bubba Camp, M.D.  ANESTHESIA:                   General.  INDICATIONS:                  The patient is a 45 year old woman admitted to the hospital with nausea, vomiting, and abdominal distention.  She had previously undergone total abdominal hysterectomy and right salpingo-oophorectomy approximately several days prior to this, and was discharged on a regular diet.  On admission, she had been at times passing some flatus, but this was short-lived.  She continues to have nausea, vomiting, and distention, and CT scans continued to show a picture of bowel obstruction.  She is brought to the operating room for exploration.  DESCRIPTION OF PROCEDURE:     Following the induction of satisfactory general anesthetic with the patient positioned supinely, the abdomen was routinely prepped and draped to be included in the sterile operative field.  A midline incision was made in the lower abdomen up to and extending, spreading the umbilicus into the skin and subcutaneous tissues down through the linea alba. The old midline Pfannenstiel was reopened and the peritoneal cavity entered. There was a significant amount of inspissated fluid within the abdomen and thick fibrous adhesions noted.  Area of obstruction was located to be at the region of the right adnexa where the right tube was removed.  Adhesions  were taken down serially and broken up.  The small bowel was run from the ileocecal pouch to the ligament of Treitz.  All of the areas of dissection were then checked for hemostasis.  There were no enterostomies created.  The peritoneal cavity was then irrigated thoroughly with normal saline.  Sponge, instrument, and sharp counts were verified.  The midline closed with a running suture of #1 Novofil.  The subcutaneous tissue was irrigated.  The skin was closed with staples.  Sterile dressings were applied.  The anesthetic was reversed and the patient moved from the operating room to the recovery room in stable condition.  She tolerated the procedure well. Dictated by:   Candee Furbish. Bubba Camp, M.D. Attending Physician:  Schuyler Amor DD:  07/04/02 TD:  07/08/02 Job: [id number]

## 2011-05-14 NOTE — Discharge Summary (Signed)
NAME:  [name] [name], [name] [name]               ACCOUNT NO.:  [id number]   MEDICAL RECORD NO.:  [id number]           PATIENT TYPE:   LOCATION:                                 FACILITY:   PHYSICIAN:  Karlton Lemon, M.D.    DATE OF BIRTH:  Mar 22, 1966   DATE OF ADMISSION:  12/08/2006  DATE OF DISCHARGE:  12/10/2006                               DISCHARGE SUMMARY   DISCHARGE DIAGNOSES:  1. Diabetic gastroparesis.  2. Hypertension.  3. Diabetes mellitus type 2.  4. Tobacco abuse.  5. Leukocytosis.  6. Foot calluses.  7. Depression.   CONSULTS:  None.   PROCEDURES:  1. Chest x-ray showed no acute cardiopulmonary disease.  2. Abdominal series.  No bowel obstruction, no acute process.  3. EKG showed no ST-T or Q-wave changes compared to previous.   HOSPITAL COURSE:  1. Nausea and vomiting:  This patient's usual presentation with her      diabetic gastroparesis.  Lipase was normal.  She has a history of      cholecystectomy and a common duct stone was felt to be very      unlikely.  Gastroenteritis was possible, though she had no sick      contacts.  She was restarted on IV fluids, Reglan every 8 hours,      Protonix IV then switched over to p.o. once she tolerated p.o.      fluids, Phenergan, and Zofran p.r.n.  She was made n.p.o. initially      and advanced for diet to clears, then full liquids on discharge.      Gastroccults were positive x2.  Hemoglobin was stable leading up to      discharge.  She was felt to have a small Mallory-Weiss tear from      repeated emesis.  Cardiac markers were negative x3, and there were      no changes on EKG.  2. Hypertension:  Blood pressure was markedly elevated to 188/99 on      admission.  She could not keep down fluids or medicines prior to      admission.  She was given labetalol p.r.n. for blood pressures      greater than 180/110.  She had no signs of end-organ damage      including mental status changes, chest pain, acute renal failure,      or  changes in peripheral perfusion.  Blood pressure was at goal by      post admission day #1.  Restarted her home lisinopril.  EKG was      without changes.  TSH was normal.  3. Tobacco abuse:  We obtained a smoking cessation consult.  She was      placed on a nicotine patch throughout admission.  4. Foot calluses:  Recommended podiatry followup.  5. Diabetes mellitus type 2:  Her A1c is 8.7.  We started her on low-      dose Lantus and discontinued her 70/30 regimen.  We continued      metformin when she was tolerating p.o.  6. Leukocytosis:  White blood cell count  was 15.7 on admission, down      to 7.6 on discharge.  Felt to be secondary to demargination and      dehydration.  This has resolved at discharge.  7. Depression:  We discontinued the amitriptyline, and we started      nortriptyline with less possibility of side effects.   DISCHARGE MEDICATIONS:  1. Desipramine 50 mg p.o. daily.  2. Lisinopril 5 mg p.o. daily.  3. Metformin 1000 mg q.a.m. and 500 mg q.h.s.  4. Lantus 10 units subcu q.h.s.  5. Aspirin 81 mg p.o. daily.  6. Reglan 10 mg p.o. 30 minutes prior to meals.  7. Nicotine 10 mg patch daily to change each day and instructed not to      smoke while on this patch.   The patient was instructed to check her sugars at least once daily in  the morning and to write them down and bring them with her to the next  visit.  The patient was also instructed to discontinue her  amitriptyline.   DISCHARGE CONDITION:  Good, stable.   FOLLOWUP:  Dr. [name].  The patient to call to schedule an  appointment in 5 to 7 days from discharge.   REASON FOR FOLLOWUP:  1. Podiatry appointment.  2. Titrate Lantus with possible sliding scale as needed.  3. Make sure she can afford her medications.      Karlton Lemon, M.D.  Electronically Signed     SH/MEDQ  D:  07/24/2007  T:  07/24/2007  Job:  [id number]

## 2011-05-14 NOTE — H&P (Signed)
NAME:  [name] [name], [name] [name]               ACCOUNT NO.:  [id number]   MEDICAL RECORD NO.:  [id number]          PATIENT TYPE:  INP   LOCATION:  6729                         FACILITY:  [facility]   PHYSICIAN:  Arnette Norris, M.D.       DATE OF BIRTH:  Mar 20, 1966   DATE OF ADMISSION:  02/11/2007  DATE OF DISCHARGE:                              HISTORY & PHYSICAL   CHIEF COMPLAINT:  Intractable nausea and vomiting.   HISTORY OF PRESENT ILLNESS:  [name] [name] is a 45 year old African-  American female with a history of uncontrolled diabetes, history of  small-bowel obstruction, gastroparesis with frequent admissions,  esophagitis, GERD who presented with an approximately 12-hour history of  intractable nausea and vomiting.  She reports that she has vomited  greater than 20 times and her emesis has appeared coffee ground.  She  started vomiting at about 2300 yesterday, but did not begin having  coffee-ground emesis until 7:00 a.m. this morning and denies any gross  red blood.  She tried to take her usual dose of Reglan with no relief.  She also has not had a bowel movement in almost a week despite taking  MiraLax daily.   PRIMARY CARE [name] [name]:  Dr. [name]   ATTENDING ON ADMISSION:  Dr. McDiarmid   REVIEW OF SYSTEMS:  GENERAL:  Negative for fevers.  CVS:  Negative for  chest pain.  PULMONARY:  Negative for shortness of breath.  GI:  Positive for constipation, nausea and vomiting.   PAST MEDICAL HISTORY:  1. Diabetes.  2. Hypertension.  3. Depression.  4. Diabetic neuropathy.  5. Smoking.  6. Anxiety.  7. Gastroparesis.  8. Esophagitis.   MEDICATIONS:  1. Lantus 12 units q.h.s.  2. Lisinopril 20 mg daily.  3. Metformin 500 mg two tabs b.i.d.  4. Reglan 10 mg p.o. t.i.d. with meals as needed.  5. Phenergan 12.5 mg one to two tabs q.6.h. p.r.n.   ALLERGIES:  PERCOCET.   FAMILY HISTORY:  Mother died of MI in 05-08-2003.  Several relatives  also have diabetes.   SOCIAL HISTORY:   Patient has four children, twin sons that are 28, a son  who is 11 and a daughter who is 31.  Daughter lives with her.  She also  lives with a cousin and is currently out of work.  She denies alcohol or  drug use, but does smoke one pack over three days.  She does not  remember how many years she has been smoking, but greater than 20.  She  also has a history of a suicide attempt in the winter of 2003.   PHYSICAL EXAMINATION:  VITAL SIGNS:  Temperature is 97.5, blood pressure  is 203-to-228/102-to-115, respiratory rate is 22, oxygen saturation is  100% on room air, pulse is 114.  GENERAL APPEARANCE:  She is alert in obvious distress and actively  vomiting.  MENTAL STATUS:  She is oriented x3.  LUNGS:  Clear to auscultation bilaterally, no wheezes or rhonchi.  HEART:  Tachycardic with no murmurs, normal S1-S2.  ABDOMEN:  Soft, very tender to palpation over  the left upper quadrant,  no rebound, no guarding, decreased bowel sounds, no distension.  EXTREMITIES:  No edema, 2+ pulses bilaterally.  GENITAL/RECTAL:  She is Hemoccult-negative, but Gastroccult-positive.  SKIN:  She has bilateral calluses over her first and fifth metatarsal,  no ulceration, odor or drainage.   LABS:  White count 6.1, hemoglobin 11.8 with a baseline of 11.9 in  January of 2008, hematocrit 34.7, platelets 268, lipase of 22, PT of  12.7, INR 0.9, MCV is 95.8, sodium of 144, potassium 3.4, chloride of  102, CO2 of 35, BUN of 17, creatinine of 1.2 baseline of 0.8 in January  of 2008, glucose of 234, AST is 19, ALT is 13, albumin is 4.2, alk phos  is 53, t bili is 0.9, direct bili is less than 0.1.   ASSESSMENT/PLAN:  This is a 45 year old female with poorly controlled  diabetes and a history of diabetic gastroparesis with frequent episodes  of intractable nausea and vomiting admitted with coffee-ground emesis.   1. Nausea and vomiting with Gastroccult-positive emesis.  This is      likely secondary to gastroparesis  with esophagitis, but cannot rule      out Mallory-Weiss tear.  We will type and cross two units of packed      red blood cells, but is currently hemodynamically stable.  We will      also consult [facility], as her last EGD was with them in January      of 2005 which was consistent with esophagitis and gastroparesis      ulcerations.  She is also chronically constipated so we will check      a KUB to rule out obstruction and she has a history of a small-      bowel obstruction.  We will start high-dose proton pump inhibitor,      continue Reglan, and Phenergan.  We will consider placing NG-tube      if nausea and vomiting persist or if obstruction present on KUB.      Unlikely secondary to gastroenteritis, as patient is afebrile with      normal white count and given that she typically presents with these      same symptoms.  We will also place two large bore IVs and re-      hydrate aggressively.  Hold aspirin and follow CBCs and give      morphine for pain.  2. Hypertension not well controlled, likely secondary to [name]'s      inability to tolerate p.o.  She was taking lisinopril 20 mg daily.      We will start labetalol IV for hypertensive urgency and decrease      her rate of IV fluids to 100 mL per hour.  3. Diabetes poorly controlled.  A1c was 7.6 last month.  Since patient      is currently on p.o., we will hold Lantus and metformin.  Start      sliding-scale insulin and follow CBGs.  We will may need to add D5      to the fluids as we follow her CBGs.  4. Tobacco abuse.  We will off her Nicotine patch and order smoking      cessation consult.  5. Prophylaxis.  SCDs.  6. FEN/GI:  NPO.  7. Code status.  Full code.           ______________________________  Arnette Norris, M.D.     TA/MEDQ  D:  02/11/2007  T:  02/12/2007  Job:  [id number]

## 2011-05-14 NOTE — Discharge Summary (Signed)
[facility] [place]  Patient:    [name] [name], [name] [name] Visit Number: [id number] MRN: [id number]          Service Type: MED Location: [phone] Attending Physician:  Schuyler Amor Dictated by:   Raeanne Gathers, M.D. Admit Date:  06/15/2002 Discharge Date: 06/18/2002   CC:         Saralyn Pilar L. Bubba Camp, M.D.  Odis Hollingshead, M.D.  Mamie Laurel, M.D.   Discharge Summary  DATE OF BIRTH:  12-26-1966  DISCHARGE DIAGNOSES:  1. Nausea and vomiting.  2. Pyelonephritis.  3. Midline wound infection.  4. Type 2 diabetes mellitus.  5. Hypertension.  6. History of depression.  7. Diabetic neuropathy.  8. Constipation, chronic.  9. Tobacco abuse. 10. Anxiety disorder. 11. Status post total abdominal hysterectomy in May 2003. 12. Status post laparoscopic for lysis of adhesions in May 2003.  DISCHARGE MEDICATIONS:  1. Augmentin 875 mg 1 tablet p.o. b.i.d. x 11 days.  2. Percocet 7.5/500 1 pill p.o. q.6h. p.r.n.  3. Benadryl 25 mg 1 tablet p.o. q.6h. p.r.n. itching.  4. Senokot S 2 tablets p.o. b.i.d. p.r.n. constipation.  5. Glucotrol 10 mg p.o. b.i.d.  6. Glucophage XR 1000 mg b.i.d.  7. Neurontin 600 mg b.i.d.  8. Pamelor 100 mg p.o. q.d.  9. Propranolol 20 mg 1 tablet p.o. b.i.d. 10. Ramipril 1 tablet p.o. q.d.  CONSULTS:  Surgery, Odis Hollingshead, M.D.  PROCEDURES PERFORMED:  None.  HISTORY OF PRESENT ILLNESS:  Please see the admission history and physical for complete details.  In brief this is a 45 year old African-American female who presented to the clinic with a five day history of abdominal pain, back pain, nausea and vomiting. She had been seen earlier that same week and was diagnosed with a urinary tract infection, where cultures were drawn and grew out greater than 100,000 colonies E. coli. She was given a prescription for antibiotics but never got this filled, she reported secondary to lack of funds, and had since  become febrile with worsening flank pain along with nausea and vomiting the prior two days before admission, unable to keep any p.o.s down. She was admitted for inpatient IV antibiotics secondary to her inability to keep p.o.s down and for evaluation of her midline wound infection.  LABORATORY STUDIES ON ADMISSION:  Hemoglobin 12.2, white count 7.5, platelet count 285. BUN 10, creatinine 0.5, sodium 135, potassium 3.7, chloride 100, bicarbonate 26, glucose 229.  A KUB was negative.  HOSPITAL COURSE:  #1 - PYELONEPHRITIS:  We did go ahead and reculture her urine. A UA on admission only showed 100 of glucose, 15 ketones, was negative for nitrite and leukocyte esterase. She did subsequently grow out greater than 100,000 colonies of E. coli which was pansensitive. She was originally placed on Unasyn when she came in. Blood cultures were obtained which were negative throughout her hospitalization. She was having a tremendous amount of pain on admission. She was placed on morphine and this controlled her pain very nicely until the day of discharge, when she was switched to p.o. Percocet, which she seemed to tolerate fine despite a questionable allergy. She said this was only itching, and she did have some itching with the morphine, but this was resolved with Benadryl. Her nausea and vomiting also resolved. Her flank pain much improved.  Given that she was able to take p.o.s, we felt she was stable for discharge and continuation of outpatient antibiotics. It was stressed the critical  importance of her completing all of her antibiotics for the next 11 days. She was sent out on Augmentin 875 p.o. b.i.d. and was instructed that if she did not complete these antibiotics, it would be very likely that she would have recurrence of her pyelonephritis and could become very sick from this.  #2 - MIDLINE WOUND INFECTION:  On admission she had also been evaluated for some dehiscence of her wound  after her operations back in May. She had two back-to-back operations after she had a small bowel obstruction after her hysterectomy, requiring a laparotomy and lysis of adhesions. Since that time she has had some difficulty with her wound healing and had evidently failed followup with Dr. Kalman Shan. She was seen one day prior to admission for evaluation of this and was just told to keep the wound clean and dry and reportedly was not given any antibiotics at that time. She did have a lot of purulent material from her wound on admission. This was cultured and this wound grew out few diphthroids. Gram stain showed few WBCs, predominant PMNs, few gram negative rods and gram positive cocci in clusters and pairs. She was placed on Unasyn empirically. She was never febrile, either on admission or during her hospitalization.  Dr. Zella Richer of surgery was consulted to evaluate on the management of her wound. He did recommend local wound care, including cleaning the wound daily with hydrogen peroxide and then using gauze to pack the wound daily. This was done and actually the wound looked a lot better on the day of discharge. She had much less purulent exudate from the wound, the majority of which was coming from the inferior margin where the midline and Pfannenstiel incisions met.  Dr. Bubba Camp was the surgeon who performed the laparotomy but was not available for a consultation. I did speak with him on the day of discharge and he agreed that the Augmentin would be fine coverage for this wound, and he would to follow her up this week, to come by his office for a wound check. She was given his number and instructions to follow up with him.  #3 - DIABETES:  When she came in her blood sugars were poorly controlled. She said that her blood sugars have been running in the 200 and 300 range. This  was presumed secondary to her ongoing infection, both the pyelonephritis and her wound infection. She was  continued on her Glucotrol 10 mg p.o. b.i.d. Her glucophage was held and she was covered with sliding-scale. Her blood sugars remained under fairly good control in the high 100s, low 200s while she was here, although she did come down some on the day of discharge down in the 1 teens range. On discharge she was restarted on her glucophage and Glucotrol. This may need follow up, given that she does have the infection going on, as far as getting better control as an outpatient.  #4 - HYPERTENSION:  Her blood pressure remained under good control. She was continued on her propranolol and ramipril. She did actually have some elevated blood pressures on admission. Her blood pressure ranged 137 to 172 over 90s to 108, and for this reason she was additionally added on hydrochlorothiazide to her regimen. Her blood pressure responded nicely, and on the day of discharge, her blood pressure ranged from 118 to 150s over 60s to 90s.  #5 - CONSTIPATION:  This seems to be a chronic problem for her. She states it has been worse since her operations. I  have a feeling this is secondary to decreased ambulation and some element of ileus postoperatively. She did respond well to a milk and molasses enema. She was continued on Senokot and discharged on Colace 100 b.i.d., which I believe she was on prior to her admission. I did instruct her to continue taking her home medications for her constipation.  #6 - TOBACCO ABUSE:  We originally tried to put her on the patch, because she said she did have a desire to quit, but demanded to smoke while she was in the hospital, so the patch was discontinued and she did go out to smoke while she was here in the hospital. This will need to be addressed on an outpatient basis for possible smoking cessation, as the patient seems amenable to this.  DISCHARGE INSTRUCTIONS:  For pain management the patient was instructed to use her Percocet as directed. Activity as tolerated.  Diet, she is to continue an 1800 calorie ADA diet. Wound care, she was instructed that she may shower. She was instructed to keep the wound clean and dry. She is to clean the lower portion of the wound with hydrogen peroxide on a Q-tip and then pack it with gauze once a day.  FOLLOWUP:  She was instructed to follow up on June 28, 2002, Thursday, at 10:45 a.m. at [facility] with Dr. Mamie Laurel for hospital follow up.  She was instructed to call Dr. Rosina Lowenstein office at [phone] to make an appointment in the next week for a wound check. She was also instructed to go to [facility] admissions unit on Wednesday, June 20, 2002, to have her wound checked at 11 a.m. when Dr. Kalman Shan would be available to look at her wound, as he performed her surgery. Dictated by:   Raeanne Gathers, M.D. Attending Physician:  Schuyler Amor DD:  06/18/02 TD:  06/19/02 Job: [id number]

## 2011-05-14 NOTE — Discharge Summary (Signed)
[facility]  Patient:    [name] [name], [name] [name] Visit Number: [id number] MRN: [id number]          Service Type: GYN Location: [place] 01 Attending Physician:  Lucia Gaskins Dictated by:   Pamalee Leyden, M.D. Admit Date:  03/13/2002 Discharge Date: 03/16/2002                             Discharge Summary  HISTORY:                      The patient is a 45 year old black female who was admitted with severe pelvic pain, uterine fibroids, and underwent total abdominal hysterectomy, right salpingo-oophorectomy, lysis of pelvic adhesions on the day of admission.  PREOPERATIVE DIAGNOSES:       1. Uterine fibroids and pelvic pain, plus right                                  tubo-ovarian abscess.                               2. Hemorrhagic cyst of the right ovary.                               3. Pelvic adhesions.  POSTOPERATIVE DIAGNOSES:      1. Uterine fibroids and pelvic pain, plus right                                  tubo-ovarian abscess.                               2. Hemorrhagic cyst of the right ovary.                               3. Pelvic adhesions.  HOSPITAL COURSE:              The pathology was confirmatory.  The patient did well postoperatively.  Because of the abscess, she was placed on Ancef, postoperatively, for two days which was discontinued after her course became afebrile.  She had a nonremarkable postoperative course.  The patient had an IVP because of the marked adhesions to rule out any compromise to the ureters. That was normal.  The patient had a hospital admission H&H of 12.5 and 3.6 and a discharge of 8.3 and 24.5 and is mobilizing with no bad results.  The patient had been put on coverage insulin during her postoperative course and will revert back to her preoperative Glucophage and Glucotrol regime.  She will be encouraged to report back to her internist as her glucoses run somewhat high during her hospitalization.   The patient has been given instructions as to physical activity, followup, i.e., suture removal in the clinic on the 25th of the month, and diet.  She is being discharged on Darvocet for pain, Glucophage 500 mg p.o. t.i.d., and Glucotrol 10 mg b.i.d.  DISCHARGE DIAGNOSES:  Postoperative status, total abdominal hysterectomy, right salpingo-oophorectomy. Dictated by:   Pamalee Leyden, M.D. Attending Physician:  Lucia Gaskins DD:  03/16/02 TD:  03/17/02 Job: [id number]

## 2011-05-14 NOTE — H&P (Signed)
NAME:  [name] [name], [name] [name]               ACCOUNT NO.:  [id number]   MEDICAL RECORD NO.:  [id number]          PATIENT TYPE:  INP   LOCATION:  6703                         FACILITY:  [facility]   PHYSICIAN:  Cletus Gash T. Pickard II, MDDATE OF BIRTH:  April 23, 1966   DATE OF ADMISSION:  04/03/2007  DATE OF DISCHARGE:                              HISTORY & PHYSICAL   PRIMARY CARE PHYSICIAN:  Dr. [name] at the [facility].   CHIEF COMPLAINT:  I cannot stop vomiting.   HISTORY OF PRESENT ILLNESS:  The patient is a 45 year old African-  American female with past medical history of diabetes mellitus type 2,  poor control, history of gastroparesis, history of bowel obstruction,  who presents with nausea and vomiting.  She states that it began about 4  a.m. this morning with intense nausea and epigastric discomfort that was  nonradiating.  She is now vomiting nonbilious emesis multiple times.  She denies any hematemesis.  She denies any fever, back pain.  She had 1  loose stool this morning.  She denies any melena or bright red blood per  rectum.  She denies any dysuria or CVA tenderness.  She denies eating at  any restaurant or eating food suspicious for food poisoning.  She does  not drink alcohol.  She does report moderate to severe epigastric pain.   REVIEW OF SYSTEMS:  Constitutional:  Negative for fevers or chills.  Cardiovascular:  Negative for chest pain.  Pulmonary:  Negative for  cough or shortness of breath.  GI:  Positive for nausea, vomiting and  epigastric pain.  GU:  Is negative for dysuria.   PAST MEDICAL HISTORY:  1. Diabetes mellitus type 2.  2. Hypertension.  3. Depression.  4. Diabetic neuropathy.  5. Tobacco abuse.  6. Anxiety.  7. History of suicide attempt.  8. History of migraines.  9. History of gastroparesis.  10.History of pyelonephritis with hospitalization in Apr 03, 2003.  11.History of recurrent UTI's.   PAST SURGICAL HISTORY:  1. She had a  total abdominal hysterectomy with lysis of adhesions.  2. She has had a bowel obstruction which required surgical      intervention in 04/03/2003.  3. She has had a cholecystectomy.   MEDICATIONS:  1. Lantus 12 units subcutaneous nightly.  2. Lisinopril 20 mg p.o. once a day.  3. Metformin 500 mg p.o. q.p.m., 1000 mg p.o. q.a.m.  4. Reglan 10 mg p.o. q.6 hours p.r.n.  5. Amlodipine 10 mg p.o. once a day.   ALLERGIES:  PERCOCET.   PROCEDURES:  1. She has a history of EGD which showed esophagitis and gastroparesis      in April 02, 2004.  2. She had an ECHO in 2006-04-02 that showed an ejection fraction of 75%      with mild septal hypertrophy and left ventricular wall thickness.   FAMILY HISTORY:  Mother died of an MI in 2003-04-03.  She has diabetes in a  first degree relative.   SOCIAL HISTORY:  She is monogamous.  Has 4 children with twin sons 63, a  son 25 and a  daughter 12.  Her daughter lives with her.  She lives with  a cousin.  She is out of work.  She has a 10th grade education.  She  denies any alcohol.  She does smoke marijuana.  She smokes tobacco  approximately 1 pack every 1-2 days.   PHYSICAL EXAMINATION:  VITAL SIGNS:  Temp is 97.6.  Pulse is 118-130.  Blood pressure 160-182/87-108.  GENERAL:  She is in moderate distress.  Alert and oriented x3.  HEENT:  Atraumatic, normocephalic.  Pupils equal round and reactive to  light.  Extraocular movements are intact.  TMs and canals clear on  examination.  There is no erythema, exudate, posterior oropharynx.  Mucous membranes are moist but there is poor dentition.  LUNGS:  Are clear to auscultation bilaterally.  No wheezes, crackles or  rales.  CARDIOVASCULAR:  Tachycardic but no murmurs, rubs or gallops.  ABDOMINAL EXAM:  Soft, nondistended with diminished bowel sounds.  However, she is tender to palpation in the epigastrium.  There is no  rebound.  There is voluntary guarding.  EXTREMITIES:  No cyanosis, clubbing or edema.  NEUROLOGIC EXAM:   Cranial nerves II-XII grossly intact with muscle  strength 4/5 equal and symmetric in the upper and lower extremities.   LABS:  White count is 15.6.  Hemoglobin 11.7.  Hematocrit 35.0.  Platelet count is 280.  She has 83% PMNs with an Hugo of 13.  Sodium is  139.  Potassium is 3.6.  Chloride is 103.  Bicarb is 25.  BUN is 18.  Creatinine 0.96.  glucose is 221.  Total protein 7.3.  Albumin is 4.2.  AST is 17 and ALT is 11.  Lipase is 152 and elevated.   Urinalysis shows a specific gravity of 1.020 with glucose of 100.  Ketones of 40.  Protein greater than 300.  Nitrite positive.  Leukocyte  esterase small.  Few epithelial cells, 3-6 white blood cells, 7-10 red  blood cells and many bacteria.   Chest x-ray is normal.   Abdominal series shows no evidence of obstruction.   ASSESSMENT AND PLAN:  This is a 45 year old African-American female.  1. Nausea and vomiting.  Question is this secondary to gastroparesis      or pancreatitis.  Regardless I will treat the patient with bowel      rest.  Will make her NPO and advance diet as tolerated to clear      liquids and then carb modify once the patient is pain free.  We      will control her sugars closely with q.4 hours sliding scale      insulin and Lantus 12 units subcutaneous nightly.  We will give her      Reglan 10 mg IV q.6 hours.  We will give her 25 mg of Phenergan IV      q.6 hours p.r.n. for nausea.  We will vigorously rehydrate.  Give      her normal saline 1 liter bolus and then 250 mL every hour for 4      hours and then switch her to D-5 half normal saline with 20 mEq per      liter of KCl at 100 mL per hour thereafter and watch her fluid      status closely with strict I&Os.  2. Pancreatitis.  Lipase is mildly elevated and she has physical exam      consistent with tender epigastrium.  Per her Ranson's criteria she  gets 1 point for CBG greater than 200 and 1 point for white blood     cells count approximately 16.  I will  check a LDH.  Assuming that      if it is positive at maximum she will get 3 points for the Ranson's      criteria.  That would indicate mild to moderate pancreatitis.  We      will observe her closely and consider a CT of her belly.  If she      becomes febrile or clinically to rule out pseudocyst or abscess.  3. Elevated white blood cell count with an elevated ANC.  Question is      this due to a FIRS reaction secondary to pancreatitis, or      dehydration, or UTI (see her urinalysis).  We will rehydrate and      cover her possible UTI with Cipro 400 mg IV q.12 hours and monitor      clinically.  4. Diabetes mellitus type 2.  Last A1c was excellent.  We will hold      metformin for now and continue Lantus 12 units subcutaneous night      and cover and with sliding scale insulin q.4 hours with CBGs.  5. Hypertension.  We will hold her lisinopril as she is dehydrated and      hold her Norvasc for now.  We will treat her with p.r.n. labetalol.      I believe her elevated blood pressures at present is secondary to      pain and with a significant tachycardia.  We will control with      morphine and follow it clinically.  6. Abdominal pain.  This is likely secondary to problem #2 the      pancreatitis.  She has no peritoneal signs.  We will follow it      clinically.  I have a low threshold for performing CT of her      abdomen if she decompensates, becomes febrile or has worsening of      abdominal pain.      Cletus Gash T. Pedro Earls, MD     WTP/MEDQ  D:  04/03/2007  T:  04/03/2007  Job:  [id number]

## 2011-05-14 NOTE — Op Note (Signed)
NAME:  [name] [name], [name] [name] NO.:  [id number]   MEDICAL RECORD NO.:  [id number]          PATIENT TYPE:  INP   LOCATION:  6729                         FACILITY:  [facility]   PHYSICIAN:  Waverly Ferrari, M.D.    DATE OF BIRTH:  1966/03/25   DATE OF PROCEDURE:  DATE OF DISCHARGE:                               OPERATIVE REPORT   PROCEDURE:  Endoscopy with biopsy.   INDICATIONS:  Hematemesis with a history of gastroparesis.   ANESTHESIA:  Fentanyl 75 mcg, Versed 4 mg.   PROCEDURE:  With the patient mildly sedated in the left lateral  decubitus position, the Pentax videoscopic endoscope was inserted in the  mouth and passed under direct vision through the esophagus which  appeared normal.  We entered into the stomach.  The fundus, body,  antrum, duodenal bulb, and second portion of the duodenum were seen on  direct view and appeared normal and patent.  From this point, the  endoscope was slowly withdrawn, taking circumferential views of the  duodenal mucosa until the endoscope had been pulled back into the  stomach, placed in retroflexion to view the stomach from below.  The  endoscope was straightened and withdrawn, taking circumferential views  of the remaining gastric and esophageal mucosa, stopping to biopsy a  thickened fold in the fundus seen on retroflexed view.  The endoscope  was withdrawn.  The patient's vital signs, pulse oximeter remained  stable.  The patient tolerated the procedure well without apparent  complications.   FINDINGS:  Thickened gastric fold in fundus, erythematous, but only one  fold was noted.  This was biopsied.   PLAN:  Await biopsy report.  Continue Reglan, and the patient will be  followed up tomorrow by [facility].           ______________________________  Waverly Ferrari, M.D.     GMO/MEDQ  D:  02/12/2007  T:  02/12/2007  Job:  [id number]   cc:   Blane Ohara McDiarmid, M.D.  [facility]

## 2011-05-14 NOTE — Discharge Summary (Signed)
NAME:  [name] [name], [name] [name]               ACCOUNT NO.:  [id number]   MEDICAL RECORD NO.:  [id number]          PATIENT TYPE:  INP   LOCATION:  6729                         FACILITY:  [facility]   PHYSICIAN:  Arnette Norris, M.D.       DATE OF BIRTH:  [birth year]   DATE OF ADMISSION:  02/11/2007  DATE OF DISCHARGE:  02/13/2007                               DISCHARGE SUMMARY   ATTENDING AT DISCHARGE:  Dr. Dorian Pod.   CONSULTATIONS:  Gastroenterology.   PROCEDURES/IMAGES:  1. Chest x-ray, on February 16, showed no evidence of acute      cardiopulmonary disease.  2. KUB, on February 16, showed no acute abnormality.  3. KUB, on February 17, showed interval increase in large and small      bowel, gas compatible with ileus.   DISCHARGE DIAGNOSES:  1. Nausea and vomiting, resolved.  2. Diabetic gastroparesis.  3. Type 2 diabetes.  4. Peripheral neuropathy.  5. Hypertension.  6. Tobacco dependence.  7. Depression.   DISCHARGE MEDICATIONS:  1. Reglan 10 mg 1 tab q.i.d.  2. Lisinopril/HCTZ 1 tab daily.  3. Metformin 500 mg 2 tabs b.i.d.  4. Phenergan 25 mg 1 tab q.6 hours p.r.n.  5. Lantus 12 units q.h.s.  6. MiraLax 17 grams p.o. daily for constipation.   DISCHARGE LABS:  On admission, patient had a white count, which was  normal of 6.1 and her hemoglobin was 11.8 on admission, which is close  to her baseline of 11.9.  Her PT was 12.7, her INR was 0.9.  Her lipase  was 22.  Her creatinine was 1.2 with a baseline of 0.8.  At discharge,  her creatinine is 1.1, sodium is 139, potassium is 3.7.   HOSPITAL COURSE:  1. Irritable nausea and vomiting with bloody emesis.  This is likely      secondary to gastroparesis with esophagitis.  EGD was negative for      obstruction and ulcers.  We did type and cross her 2 units of      packed red blood cells on admission, but it was not necessary.  She      remained hemodynamically stable throughout her admission.  We      continued her Reglan and  Phenergan and by the second day of her      admission, her nausea and vomiting had resolved.  KUB on the second      day showed some chronic ileus.  Would, therefore, recommend that      she continues to take her Reglan regularly.  She admits to      sometimes not taking it.  I did rewrite her a prescription for her      Reglan.  I also educated her on the importance of controlling her      diabetes, as it is a likely source of her gastroparesis.  2. Diabetes, poorly controlled.  Her hemoglobin A1c was 7.6 in January      of 2008.  We held her metformin on admission due to acute renal      failure, but we will  restart it prior to discharge.  Encouraged her      to take her Lantus and metformin regularly.  3. Acute renal failure.  Her creatinine was slightly elevated on      admission to 1.2, her baseline 0.8, likely secondary to      dehydration, resolved by the last day of her admission.  4. Hypertension.  It was not well controlled on admission secondary to      [name] [name]'s inability to tolerate p.o.  Resolved by the second day      of her admission.  We will continue her home regimen of lisinopril      20 mg daily with hydrochlorothiazide 12.5 daily.  5. Tobacco use.  We did order a smoking cessation consult.  [name] [name]      is not interested in smoking cessation at this time.  Would      recommend revisiting this topic at a later date.   FOLLOWUP ISSUES:  [name] [name] is instructed to take her Lantus and  metformin regularly to control her diabetes, which will help with her  gastroparesis.   FOLLOWUP APPOINTMENTS:  Patient is to follow up with Dr. [name] at  the [facility], [phone], on February 25 at 2 o'clock p.m.           ______________________________  Arnette Norris, M.D.     TA/MEDQ  D:  02/13/2007  T:  02/14/2007  Job:  [id number]   cc:   [name], M.D.  Waverly Ferrari, M.D.

## 2011-05-14 NOTE — H&P (Signed)
NAME:  [name] [name], [name] [name]               ACCOUNT NO.:  [id number]   MEDICAL RECORD NO.:  [id number]          PATIENT TYPE:  INP   LOCATION:  [id number]                         FACILITY:  [facility]   PHYSICIAN:  Blane Ohara McDiarmid, M.D.DATE OF BIRTH:  January 24, 1966   DATE OF ADMISSION:  03/15/2007  DATE OF DISCHARGE:                              HISTORY & PHYSICAL   PRIMARY CARE PHYSICIAN:  [name], M.D. at [facility].   CHIEF COMPLAINT:  Vomiting.   HISTORY OF PRESENT ILLNESS:  This is a 45 year old female with a history  of diabetic gastroparesis, who states that about 5 a.m. today she  started having stomach uneasiness, then began to have emesis 12 times  since then.  She also reported after about the 4th time that she  vomited, she began to have frank blood in her vomitus.  She states this  is similar to her previous gastroparesis sepsis, but that it is worse  and she has a headache with this.  She is having epigastric abdominal  pain which she states she normally gets with her gastroparesis as well.  The patient denies any sick contacts.   In the emergency department, the patient was bolused with 1.5 L of  normal saline, given morphine 4 mg, Phenergan 25 mg, and Protonix.  She  was minimally improved with these interventions.   PAST MEDICAL HISTORY:  1. Diabetes mellitus, type 2.  2. Hypertension.  3. History of depression.  4. Tobacco abuse.  5. Anxiety.  6. History of migraines.  7. History of the diabetic gastroparesis.   MEDICATIONS:  1. Aspirin 81 mg daily.  2. Lantus 12 units nightly.  3. Lisinopril/hydrochlorothiazide 20/12.5 mg daily.  The patient      states she is only taking the lisinopril portion because she ran      out of the combination.  4. Metformin 1000 mg b.i.d.  5. Reglan 10 mg q.i.d.  6. Ultram as needed.  7. Flexeril 10 mg as needed.   ALLERGIES:  1. PERCOCET.  2. ERYTHROMYCIN.   PAST SURGICAL HISTORY:  1. Laparoscopic  cholecystectomy.  2. Total abdominal hysterectomy and lysis of adhesions.   FAMILY HISTORY:  Mother died of an MI in 05-13-2003.  She has first-  degree relatives with diabetes.   SOCIAL HISTORY:  The patient is single and lives with her eldest son,  who is 33.  She has 4 children.  She states that she works at  [employer].  She has a [employer]chant [employer] officer, denies alcohol or drug use.  She says she smokes only about 2 cigarettes a day.   REVIEW OF SYSTEMS:  Positive for sweats, feeling hot, nausea and  vomiting.  The patient denies chills, chest pain, shortness of breath,  or other cardiorespiratory symptoms, diarrhea, dysuria.  The remainder  of the review of systems is unremarkable.   PHYSICAL EXAM:  VITAL SIGNS:  Temperature 97.9, pulse 123, blood  pressure 198/123, respirations 22.  GENERAL:  The patient appears sick, but is nontoxic.  She is a sick-  appearing and holding the epigastric region.  HEENT:  Head is atraumatic.  Extraocular movements intact.  Pupils  equal, round and reactive to light.  Tympanic membranes clear and  glossy.  Pharynx is clear without erythema or exudate.  Mucous membranes  slightly moist.  NECK:  No lymphadenopathy, full range of motion.  LUNGS:  Clear to auscultation bilaterally.  Symmetric expansion.  No  wheezes, rales or rhonchi.  CARDIOVASCULAR:  Tachycardiac with regular rhythm.  No murmurs, rubs or  gallops.  ABDOMEN:  Soft and nontender, moderately tender to palpation in the  epigastric region with guarding.  She does not have any rebound  tenderness and no other tenderness in her abdomen.  She has hypoactive  bowel sounds.  EXTREMITIES:  She has 2+ DP pulses; no cyanosis, clubbing, or edema.  She has good sensation to light touch bilaterally.  NEUROLOGIC:  Exam is nonfocal.  Her strength and sensation are intact in  all extremities.  She is able to ambulate to the bathroom without  difficulty.  She is alert and oriented, appropriate and  cooperative with  exam.  SKIN:  No rashes or lesions on her skin including on the bilateral lower  extremities on foot exam.  She does have a callus overlying the right  1st metatarsal head.   LABORATORIES AND STUDIES:  White blood cell count 8, hemoglobin 12.4,  hematocrit 36.8, platelets 317,000; neutrophils 62% with 31%  lymphocytes.  Sodium 141, potassium 3.2, chloride 106, bicarb 24, BUN 9,  creatinine 1.05, glucose 160, calcium 10.1.  Protein 7.7, albumin 4.2,  AST 28, ALT 11, lipase 28.   The patient also has an acute abdominal series which is pending on this  summary.   ASSESSMENT AND PLAN:  This is a 45 year old female with:  1. Nausea and vomiting.  This is similar to an most likely is due to      her gastroparesis.  The patient is status post a cholecystectomy,      so this is not due to biliary colic.  Her lipase and liver enzymes      are normal.  Will heme-check her stool, which was negative.  She      has had grossly bloody emesis per Nursing that we believe is due to      a Mallory-Weiss tear, as this began after she had vomited several      times.  She takes aspirin, so peptic ulcer disease is in the      differential, especially with her having epigastric pain.  We will      place her on Protonix intravenous b.i.d. presumptively and hold her      aspirin.  We will use morphine as needed for pain.  We will      continue her Reglan four times a day with Phenergan and Zofran as      needed for nausea.  She has had intravenous fluid of normal saline,      1.5-L bolus.  We will continue resuscitation with 2 times normal      for 4 hours, then place her on maintenance of D-5 half-normal      saline with potassium due to her potassium being 3.2.  We will use      an nasogastric tube if needed later for decompression.  Her EKG is      sinus tachycardia without any ST-, T-, or Q-wave changes.  We will      repeat an EKG in the morning, though feel a myocardial infarction  is very unlikely.  2. Tachycardia.  I felt this is likely due to dehydration and stress.      We will replete her fluids as noted above.  She does not have any      history of congestive heart failure exacerbation or coronary artery      disease.  3. Dehydration.  The patient status post 1.5 L of normal saline      boluses in the emergency department started on D-5 half normal      saline at 250 per hour for 4 hours, then decreased to 125 per hour.      We will make her nothing-by-mouth and fully advance her diet.  4. Hypertension.  The patient states she is only on her lisinopril at      home.  She has a history of non-adherence to treatment in the past.      We will write for labetalol 20 mg intravenously as needed for      systolic blood pressure greater than 180.  We will discharge her      home with lisinopril and hydrochlorothiazide when she is tolerating      her oral intake.  5. Diabetes mellitus, type 2.  We will check capillary blood glucose      every 4 hours with a sensitive sliding-scale insulin.  We will hold      her home Lantus.  6. Headache.  She has photophobia as well with a history of migraine.      Her neck is supple with full range of motion and she is able to      move without any pain in her back.  We will do morphine and Tylenol      as needed for pain.  We will monitor for any changes.  She has no      neurological signs or symptoms otherwise on initial exam.  7. Tobacco abuse.  The patient only smokes 2 cigarettes per day and      states she is quitting on her own.   CODE STATUS:  The patient is a full code.     ______________________________  Karlton Lemon, M.D.    ______________________________  Blane Ohara McDiarmid, M.D.    SH/MEDQ  D:  03/15/2007  T:  03/16/2007  Job:  [id number]

## 2011-05-14 NOTE — H&P (Signed)
NAME:  [name] [name], [name] [name]               ACCOUNT NO.:  [id number]   MEDICAL RECORD NO.:  [id number]          PATIENT TYPE:  INP   LOCATION:  3707                         FACILITY:  [facility]   PHYSICIAN:  Carolyn Stare, MDDATE OF BIRTH:  14-May-1966   DATE OF ADMISSION:  02/21/2007  DATE OF DISCHARGE:                              HISTORY & PHYSICAL   CHIEF COMPLAINT:  Vomiting.   HISTORY OF PRESENT ILLNESS:  [name] is a patient with a  history of diabetes mellitus, type 2, with severe gastroparesis and has  complications, who was recently discharged from the hospital on February  10 after rehydration, and she had an EGD that showed thickened gastric  folds and fundus with adenomatous mucosa.  A biopsy was taken.  The  patient was supposed to follow up with gastroenterology but did not make  the appointment, and she is here today because since 3:30 this  afternoon, the patient had started vomiting and then complaining of  abdominal epigastric pain at 7 out of 10 constant and deep.  Last meal  was yesterday around noontime.  The patient is unable to recall, falling  asleep.  Her last bowel movement today was soft, no diarrhea.  The  patient has received Zofran here in the ED with only mild relief and  continued vomiting.   PROBLEM LIST:  1. Diabetes mellitus, type 2, with complications.      a.     Diabetic gastroparesis.      b.     Diabetic neuropathy.  2. Benign systemic hypertension.  3. Depression.  4. Tobacco abuse.  5. Migraine.  6. History of recurring UTIs.  7. The patient has a Port-a-Cath for the past 2 years, used for venous      access due to heart venipuncture.   MEDICATION LIST:  1. Aspirin held during the last admission.  2. Lantus 12 units q.h.s.  3. Lisinopril.  4. Hydrochlorothiazide 20/12.5 mg daily.  5. Metformin 1000 mg p.o. b.i.d.  6. Reglan 10 mg p.o. t.i.d. p.r.n.  7. Phenergan 25 mg q.i.d. p.r.n.  8. MiraLax 17 g p.o. daily.   Secondary  to vomiting, she has not been able to take her medication.   ALLERGIES:  PERCOCET.   RECENT HOSPITALIZATION:  1. February 16 to February 18, diagnosed with nausea and vomiting with      EGD, as above.  2. Hemoglobin A1c 7.6 in January 2008.   PROCEDURES:  1. Exploratory laparotomy and cholecystectomy in January 2002.  2. Total abdominal hysterectomy, lysis of adhesions in May 2003.  3. Esophagogastroduodenoscopy:  Esophagitis, and gastroparesis in      January 2005, repeated in February of 2007, thickened gastric folds      and fundus.  Adenomatous biopsy taken.  4. Echocardiogram performed, March 2007, revealing ejection fraction      74%, mild septal hypertrophy, left ventricular wall thickness.   FAMILY HISTORY:  Mother died of an MI in 04/27/03.  Diabetes in first  degree of relatives.   SOCIAL HISTORY:  The patient monogamous, has 4 children, twin sons 69,  son 8, daughter is 63, who lives with her, and she lives with a cousin,  unemployed but currently working at [employer].  The patient has a tenth  grade education.  Denied physical abuse.  No alcohol or drugs.  Remote  history of marijuana use.  History of suicide attempt in 2003.   VITAL SIGNS:  Temperature 97, blood pressure 214/111, heart rate 128, O2  of 100% on room air.  GENERAL APPEARANCE:  The patient is lying on the bed, vomiting, and  tired.  MENTAL STATUS:  Normal.  HEENT:  Normocephalic.  Pupils equal and reactive to light and  accommodation.  Ears:  Not explored.  Oropharynx normal with dry mucous  membranes.  CARDIOVASCULAR:  The patient in sinus tachycardia with S1, S2 normal.  No murmurs, rubs, or gallops.  Regular rhythm.  CHEST:  Clear to auscultation bilaterally.  Good respiratory effort.  ABDOMEN:  Soft, positive bowel sounds in all quadrants.  Abdomen  diffusely tender, mostly under epigastric area.  No guarding, no  rebound.  EXTREMITIES:  Pulses are palpable and strong bilaterally.  RECTAL:   Deferred this morning.  Patient vomiting, not feeling well.  NEUROLOGIC EXAMINATION:  Reveal the patient is alert and oriented x3.  Cranial nerves II-XII intact.  Strength and balance deferred due to the  patient's acute condition.  Deep tendon reflexes 2+ bilaterally with  strength seems to be normal sensation and no evidence of neurological  deficits.   LABORATORY DATA:  White blood count 15.9, hemoglobin 11.6, hematocrit  34.5, platelets 304.  PT 12.6, INR 0.9, PTT of 65.  Sodium 138,  potassium 3.3, chloride 104, CO2 of 28, BUN 13, creatinine 1.2, baseline  0.3 on February 18.  Glucose 311, AST 18, ALT 15, alkaline phosphatase  58, albumin 0.6.  Total protein 7.3, albumin 4.1.  Direct bili less than  0.1.  Hemoglobin was 9.4 on February 18.   EKG:  Sinus tachycardia and KUB reveals fecal impaction.   ASSESSMENT/PLAN:  A 45 year old African American female patient.  She  was recently discharged from this service, readmitted with recurrent  intractable emesis.  1. Intractable emesis secondary to gastroparesis.  Hemoccult positive      compatible with history of esophagitis.  Given that the patient had      esophagogastroduodenoscopy on February 17 revealing only      adenomatous mucosa.  I did not think the patient has severe      bleeding nor Mallory-Weiss.  Mallory-Weiss appears at this moment.      We will start Protonix 40 mg IV b.i.d., continue Zofran and Reglan.      The patient is refusing __________ at this point.  Continue IV      hydration.  2. Anemia.  Mild normocytic normochromic but may be lower.  We will      recheck after hydration.  We will check iron TIBC, ferritin and      follow up nutrient infusion, Hemoccult stool.  3. Hypertension/tachycardia secondary to dehydration and vomit.      Hypertensive crisis at this moment, unable to take meds.  We will      continue hydration if needed.  May use labetalol 20 mg IV. 4. Leukocytosis, hemoconcentration.  Will repeat  and after hydration      doubt an infectious cause but because the patient has a sinus      infection, will check urinalysis, repeat CBC and may repeat blood      counts if  temperatures spike.  5. Hypokalemia.  We will replace.  6. Acute renal insufficiency secondary to dehydration.  Will repeat      BMP in the morning.      Carolyn Stare, MD     IM/MEDQ  D:  02/21/2007  T:  02/21/2007  Job:  [id number]

## 2011-05-14 NOTE — Assessment & Plan Note (Signed)
[facility]   NAME:  [name] [name], [name] [name]               ACCOUNT NO.:  [id number]   MEDICAL RECORD NO.:  [id number]      DATE OF BIRTH:  Jul 02, 1966   PHYSICIAN:  Joneen Boers A. Nils Pyle, M.D. VISIT DATE:  02/07/2007                                   OFFICE VISIT   VITAL SIGNS:  Blood pressure is 155/87, respirations 16, pulse rate 104,  temperature is 98.5.  Capillary blood glucose is 85 mg%.   PURPOSE OF TODAY'S VISIT:  [name] is a 45 year old diabetic who we  last saw in the clinic in December of 2007.  At that time the patient  was referred to the [facility] for 100% assistance in procuring  custom shoes and inserts.  She has not followed up with the shoes.  She  returns for evaluation of her feet.  She denies drainage, ulceration or  fever.  She is complaining of pain over calluses.  She continues to be  followed by Dr. [name] at the [facility].   WOUND EXAM:  Inspection of the lower extremities shows that there is  trace edema on the plantar surfaces of both feet.  At the first  metatarsal and 5th metatarsal heads are thickened calluses without sub  dermal hemorrhage, ulceration or fluctuance.  These areas are moderately  painful but are not associated with ulcerations.   WOUND SINCE LAST VISIT:   CHANGE IN INTERVAL MEDICAL HISTORY:   DIAGNOSIS:  Inadequate offloading without the benefit of orthotics.   TREATMENT:   ANESTHETIC USED:   TISSUE DEBRIDED:   LEVEL:   CHANGE IN MEDS:   COMPRESSION BANDAGE:   OTHER:   MANAGEMENT PLAN & GOAL:  We have encouraged the patient to follow up  with the [facility] and procure the custom shoes and inserts.  We  have filled out the necessary paperwork on her behalf.  The patient has  also been advised that she will need to form a relationship with a  podiatrist to receive routine callus paring, as that is not a service  that the [facility] now provides.  We are pleased that she has  no  active ulcerations, but look  forward to her receiving the recommended orthotics to protect her feet  against potential complications of ulcerations.  The patient is being  discharged from the [facility] with all of the provisions having been  made to assist her with the [facility] completed.           ______________________________  Epifania Gore Nils Pyle, M.D.     Rondel Oh  D:  02/07/2007  T:  02/08/2007  Job:  [id number]   cc:   [name], M.D.

## 2011-05-14 NOTE — Discharge Summary (Signed)
NAME:  [name] [name], [name] [name] NO.:  [id number]   MEDICAL RECORD NO.:  [id number]                   PATIENT TYPE:  INP   LOCATION:  [id number]                                 FACILITY:  [facility]   PHYSICIAN:  Vickki Hearing, M.D.                  DATE OF BIRTH:  Sep 07, 1966   DATE OF ADMISSION:  10/19/2003  DATE OF DISCHARGE:  10/21/2003                                 DISCHARGE SUMMARY   PRIMARY MEDICAL PHYSICIANS:  1. Karmen Bongo, M.D., [facility].  2. Marshall Cork. Jeffie Pollock, M.D., urology.   DISCHARGE DIAGNOSES:  1. Pyelonephritis.  2. Dehydration.  3. Diabetes mellitus, type 2.  4. Hypertension.   DISCHARGE MEDICATIONS:  1. Glucotrol 10 mg p.o. b.i.d.  2. Avandamet, continue home dose.  3. Ciprofloxacin 500 mg p.o. b.i.d. x 12 days.   BRIEF ADMISSION HISTORY:  This is a 45 year old African-American female with  a past medical history significant for type 2 diabetes mellitus,  hypertension, and previous episodes of pyelonephritis, who presents with a  10 hour episode of severe vomiting and diarrhea.  She awoke at 3 a.m. on the  day of admission with vomiting and diarrhea.  She states the emesis is green-  yellow in color and has had between 10-15 episodes.  The diarrhea is clear  and nonbloody.  She does report some chills and weakness.   BRIEF HOSPITAL COURSE:  #1 - PYELONEPHRITIS:  The patient was admitted with  a history as outlined above.  She was found to be afebrile and hypertensive  with a blood pressure 203/113.  Repeat orthostatics were 111/97.  She was  tachycardic at 117.  Initial laboratory work revealed dehydration only.  Her  initial urinalysis had negative nitrite and leukocyte esterase.  Due to her  significant abdominal pain, an acute abdominal series was obtained which was  negative for free air and showed paucity of gas.  She did have a slight  elevated white blood cell count of 14.2 with neutrophil of 82%.  Consequently, she was  admitted and serial abdominal exams were performed.  While in the hospital with adequate pain control and rehydration, [name]  gradually improved.  She was started on ciprofloxacin when her urine culture  returned with greater than 100,000 E. coli.  On the day of admission, [name] was tolerating p.o. liquids, and her pain, nausea, and vomiting had  significant improved.  She was consequently discharged on the recommended  dosage of ciprofloxacin 500 mg p.o. b.i.d. to complete a 14 day course.  In  addition, since this is at least her second and by the patient's history, a  third episode of pyelonephritis, a urology referral was made in order to see  if there were any reversible causes of pyelonephritis that could be dealt  with.   #2 - DEHYDRATION:  Secondary to #1, resolved with rehydration.   #3 -  DIABETES MELLITUS, TYPE 2:  The patient was discharged on her home  medications of Glucotrol and Avandamet.  She was maintained on sliding-scale  insulin while in the hospital secondary to her episodes of emesis.  A  hemoglobin A1c level was checked which was 10.5.   #4 - HYPERTENSION:  The patient's antihypertensives were held while in the  hospital.  She maintained fairly good blood pressure controls once she had  been rehydrated.  However, this will need to be followed up as an  outpatient, as her ACE inhibitor might need to be restarted.   STUDIES:  1. Acute abdominal series was negative for free air and no abnormalities.  2. Renal ultrasound revealing a left kidney approximately 2 cm larger than     the right.  It was inferred that you should consider active inflammation     or infection.  There was no abscess seen.   DISCHARGE DIET:  American Diabetic Diet.   FOLLOW UP:  1. Karmen Bongo, M.D., [facility], November 1, at 1:30 p.m.  2. Marshall Cork. Jeffie Pollock, M.D., urology, Wednesday, November 3, at 12:45 p.m.   FOLLOW-UP STUDIES:  Urine culture.  Sensitivities were  pending at the time  of discharge.                                                Vickki Hearing, M.D.    AK/MEDQ  D:  10/21/2003  T:  10/22/2003  Job:  [id number]   cc:   Karmen Bongo, M.D.  [facility] - [place], [place] [address]  Fax: [phone]   [place] Jeffie Pollock, M.D.  [place]. [address] [place]  [place] [address]  Fax: [phone]

## 2011-05-14 NOTE — Discharge Summary (Signed)
NAME:  [name] [name], [name] [name]                         ACCOUNT NO.:  [id number]   MEDICAL RECORD NO.:  [id number]                   PATIENT TYPE:  INP   LOCATION:  5727                                 FACILITY:  [facility]   PHYSICIAN:  Blane Ohara McDiarmid, M.D.             DATE OF BIRTH:  [birth year]   DATE OF ADMISSION:  01/22/2004  DATE OF DISCHARGE:  01/23/2004                                 DISCHARGE SUMMARY   DISCHARGE DIAGNOSES:  1. Nausea and vomiting.  2. Diabetes, type 2, poorly controlled.  3. Hypertension.   DISCHARGE MEDICATIONS:  1. Neurontin 600 mg one p.o. t.i.d.  2. Lexapro 10 mg one p.o. q.d.  3. Avandamet 4/1,000 mg b.i.d.  4. Lantus 20 units before breakfast.  5. Ramipril 20 mg one p.o. q.d.  6. Phenergan 25 mg p.o. q.4-6h. p.r.n. for nausea.  7. Resume other home medications as previously prescribed.  8. Glucotrol. Discontinue Glucotrol.   HISTORY OF PRESENT ILLNESS:  This is a 45 year old female patient of Dr.  Karmen Bongo of [facility] with history of  uncontrolled diabetes and questionable gastroparesis who presents with eight  hour history of persistent nausea, vomiting, and diarrhea that began shortly  after eating out to dinner. The patient complained of severe abdominal  cramping, emesis of food stuff and trace amounts of blood, nonbilious. She  also admits to frequent stools that are watery. The patient states that she  typically has chronic constipation and gastroparesis. At the time of  admission, she admitted to chills and feeling very poorly. She denied chest  pain. She has admitted to shortness of air with activity and dyspnea on  exertion.   ADMISSION PHYSICAL:  VITAL SIGNS:  Temperature 98.1, pulse 119, respirations  20, blood pressure __________ 128/98, 100% O2 saturation on room air.  GENERAL:  This is an ill appearing distress female on the commode with  diarrhea and vomiting.  HEENT:  Mucous membranes were tacky. Oropharynx  dry but clear.  LUNGS:  Clear to auscultation. No crackles, wheezes, rhonchi.  HEART:  The patient did have tachycardia. No ectopy. _____________ pulses  bilaterally. S1 and S2. No peripheral edema.  ABDOMEN:  Significant for diffuse tenderness and mild distention. Hemoccult  was pending.   LABORATORY DATA:  CBG 380. Sodium 139, potassium 3.8, chloride 103, bicarb  29, BUN 10, creatinine 0.6, glucose 367, hemoglobin 15.6. ABG:  7.53, pCO2  27, pO2 100, bicarb 23, 99% O2 saturation.   HOSPITAL COURSE:  The patient was admitted for questionable food poisoning  and management of uncontrolled diabetes and hypertension.   1. Nausea, vomiting, and diarrhea with abdominal pain. It was thought to be     food poisoning. Differential included DKA, but patient was alkalotic. The     patient was given IV rehydration and Phenergan and Zofran as tolerated.     The patient was given morphine sulfate IV for pain.  Her LFTs were within     normal limits, and repeat ABG showed less alkalosis. The day following     admission, the patient was feeling much better. Nausea and vomiting     nearly resolved. Diet was advanced, and she tolerated p.o. She did have     Gastroccult that was positive, but it was likely secondary to forceful     emesis. Her hemoglobin was within normal limits.  2. Diabetes mellitus. The patient was placed on sliding scale insulin and     given 8 units of Lantus q.h.s. while NPO. The patient's Lantus was     increased to 12 units q.h.s., and eventually patient was discharged on 20     units of Lantus before breakfast.  3. Hypertension. The patient was restarted on home antihypertensive     medications once she was tolerating p.o. Blood pressure 128/72 on day of     discharge, sent home on ramipril 20 p.o. q.d.   DISPOSITION:  The patient was feeling much better and free of nausea and  vomiting at the time of discharge. Discharged with instructions to maintain  diabetic diet, low in  sugar. She was advised to quit smoking if possible and  was told to keep previously scheduled appointment on February 14 with Dr.  Lorin Mercy of the [facility].      Beatrix Fetters, MD                            Acquanetta Sit, M.D.    TD/MEDQ  D:  04/19/2004  T:  04/20/2004  Job:  [id number]

## 2011-05-14 NOTE — Discharge Summary (Signed)
[facility] [place]  Patient:    [name] [name], [name] [name] Visit Number: [id number] MRN: [id number]          Service Type: MED Location: [phone] Attending Physician:  McDiarmid, Blane Ohara. Dictated by:   Elvera Bicker, M.D. Admit Date:  11/28/2001 Discharge Date: 12/02/2001   CC:         Mamie Laurel, M.D.   Discharge Summary  DATE OF BIRTH:  [birth year]  ADMISSION DIAGNOSES: 1. Pyelonephritis. 2. Type 2 diabetes mellitus. 3. Hypertension.  DISCHARGE DIAGNOSES: 1. Pyelonephritis. 2. Type 2 diabetes mellitus. 3. Hypertension.  CONSULTATIONS:  None.  PROCEDURES:  None.  HISTORY OF PRESENT ILLNESS:  [name] [name] is a 45 year old patient of Dr. Lucy Antigua who presented with left flank and left lower quadrant pain for one day.  The pain had come on acutely, was constant since presentation. Sharp and stabbing in nature.  Denied dysuria, but had increased frequency for three days prior.  Her last urinary tract infection was one year ago, and she had no history of kidney stones.  Please refer to the admit note for more complete history and physical.  HOSPITAL COURSE:  #1 - PYELONEPHRITIS:  The patient was admitted for pyelo.  Started on IV Tequin and morphine PCA for pain control.  She was given IV fluids.  A CT of her abdomen showed enlarged kidneys bilaterally with period of stranding, but no evidence of hydronephrosis.  It also showed a fibroid uterus.  She had blood and urine cultures which were positive for E. coli.  These were sensitive to Tequin.  The patient had a significant improvement in her discomfort throughout her hospital stay.  She had some slight flank pain, but was improving and wished to be discharged on day of discharge.  #2 - DIABETES MELLITUS:  She has had very poor control of her diabetes.  Her hemoglobin A1C was noted to be 15.9 in hospital.  She had been taking Glucophage 500 mg q.d. at home, but not following her  blood sugars as her meter was broken.  She was started on sliding scale insulin.  Her Glucophage was restarted during her stay, and Glucotrol was added.  It was gradually titrated up to Glucotrol XL 5 mg b.i.d. and Glucophage 500 mg b.i.d.  Diabetes coordinator was consulted.  She suggested increasing the Glipizide to 10 mg, and consider starting Avandia.  She suggested using the combination drug Avandamet with Avandia and Glucophage 500 mg b.i.d. to have a slightly less extensive mend than the two drugs alone.  She recommended [employer] classes for the patient which were starting in January for diabetes education, and carpool was to call the patient with the dates and times, and gave her numbers at the [facility], and her number for questions.  DISCHARGE MEDICATIONS: 1. Tequin 400 mg p.o. q.d. to complete a 14 day course total. 2. Vicodin 5/500 mg one or two q.6h., max 8 per day #30. 3. Glucophage 500 mg one tab p.o. b.i.d. 4. Glucotrol XL 10 mg one tab q.d.  DISPOSITION:  To home.  CONDITION ON DISCHARGE:  Stable.  FOLLOWUP:  The patient is to call [facility] on Monday to schedule followup with Dr. Mateo Flow in 1 to 2 weeks. Dictated by:   Elvera Bicker, M.D. Attending Physician:  McDiarmidSherren Mocha D. DD:  12/02/01 TD:  12/02/01 Job: [id number]

## 2011-05-14 NOTE — Discharge Summary (Signed)
NAME:  [name] [name], [name] [name]               ACCOUNT NO.:  [id number]   MEDICAL RECORD NO.:  [id number]          PATIENT TYPE:  INP   LOCATION:  6715                         FACILITY:  [facility]   PHYSICIAN:  Sallyanne Havers, M.D.    DATE OF BIRTH:  1966-07-22   DATE OF ADMISSION:  06/21/2005  DATE OF DISCHARGE:  06/29/2005                                 DISCHARGE SUMMARY   DISCHARGE DIAGNOSES:  1.  Gastroparesis.  2.  Diabetes mellitus, type 2.  3.  Hypertension.   DISCHARGE MEDICATIONS:  1.  Altace 2.5 mg daily.  2.  Avandamet 1000/4 mg q.a.m. and 500/2 mg nightly.  3.  NovoLog 70/30 insulin -- 14 units q.a.m. and 14 units q.p.m.  4.  Zelnorm 6 mg b.i.d.  5.  Reglan 10 mg before every meal and nightly.  6.  Phenergan 25 mg suppository per rectum every 4-6 hours p.r.n. nausea.  7.  Ultram 50 mg q.4-6 h. p.r.n. pain.   DISPOSITION AND FOLLOWUP:  The patient was discharged to home and was  instructed to call the [facility] tomorrow for a followup  appointment with Dr. Diona Browner.  She was also instructed to discuss possible  Port-A-Cath placement with her primary care physician for future episodes of  nausea and vomiting.   BRIEF HISTORY OF PRESENT ILLNESS:  [name] [name] is a 45 year old diabetic  African American female with multiple adhesions for gastroparesis who  presented with abdominal pain and nausea and vomiting, unable to keep  anything down, therefore she was admitted to the hospital for IV hydration.   HOSPITAL COURSE:  PROBLEM #1 - GASTROPARESIS:  The patient was admitted  secondary to intractable nausea and vomiting from her gastroparesis and she  was given aggressive IV hydration as well as continued on her Reglan and  Zelnorm, and she was also started on Phenergan IV and erythromycin.  The  erythromycin was discontinued secondary to worsening nausea and vomiting and  patient subsequently was able to tolerate a clear liquid then advance to a  regular diet without nausea  and vomiting on day of discharge.  A PICC line  was placed during this admission, since the patient was requiring IV  antiemetics and fluids, and that was subsequently discontinued prior to  discharge, but it is advised that the primary care physician consider  placing a Port-A-Cath, since the patient has recurrent episodes of  gastroparesis and might defer frequent hospitalizations.   PROBLEM #2 - DIABETES MELLITUS, TYPE 2:  The patient's insulin was held on  admission secondary to not taking p.o. and she was kept on her oral  medication through admission.  Her home medications consisting of Avandamet  and NovoLog insulin were restarted upon discharge and she will follow up  with her primary care physician.   PROBLEM #3 - HYPONATREMIA:  The patient's sodium went down to 130, most  likely secondary to hypovolemia from dehydration and that resolved after she  was taking a regular diet and after IV fluids.       AS/MEDQ  D:  06/29/2005  T:  06/29/2005  Job:  [id number]   cc:   Eliezer Lofts, MD  Fax: [phone]

## 2011-05-14 NOTE — H&P (Signed)
NAME:  [name] [name], [name] [name]               ACCOUNT NO.:  [id number]   MEDICAL RECORD NO.:  [id number]          PATIENT TYPE:  INP   LOCATION:  1824                         FACILITY:  [facility]   PHYSICIAN:  Elveria Rising. Damita Dunnings, M.D. DATE OF BIRTH:  11-12-1966   DATE OF ADMISSION:  01/17/2007  DATE OF DISCHARGE:                              HISTORY & PHYSICAL   CHIEF COMPLAINT:  Vomiting.   HPI:  The patient is a 45 year old African American female with poorly  controlled diabetes and a history of diabetic gastroparesis with  multiple previous episodes for intractable nausea and vomiting who began  having sudden onset nausea and vomiting at approximately midnight before  admission.  This awoke her from sleep.  She had blood streak vomitus.  With many of her previous episodes, she would have gradual escalation of  symptoms.  This had not happened during the current episode.  She has  had multiple previous episodes.  She also did not have this typical  gradual escalation of symptoms during her admission last month.  She has  had no recent fevers, no chills.  She has had positive sweats.  She has  had no left arm pain and no chest pain.  She has had diarrhea this  morning without bright red blood per rectum.  She is having no chest  pain.  She has constant left upper quadrant abdominal pain.  After  coming to the ED she has continued to vomit in spite of treatment with  insulin Zofran, Reglan and morphine.  The teaching service was called to  admit.   REVIEW OF SYSTEMS:  CONSTITUTIONAL:  No fevers.  No chills.  CARDIOVASCULAR:  No chest pain.  PULMONARY:  No shortness of breath.  GI:  As above.  GU:  No dysuria.  SKIN:  Chronic callus on both feet.   PAST MEDICAL HISTORY:  Includes:  1. Diabetes with a history of gastroparesis and peripheral neuropathy.  2. Hypertension.  3. Depression.  4. Smoking.  5. Anxiety.  6. Insomnia.  7. History of skin ulcer.  8. History of pyelonephritis in 04-01-03.  9. Small bowel resection in Mar 31, 2001.  10.History of cholecystectomy.  11.Hysterectomy.  12.EGD showing esophagitis and gastroparesis in March 31, 2004.  13.Echo in 03/31/06 showing an EF of 75% with mild septal hypertrophy and      a left ventricular wall thickness that is mildly increased.   FAMILY HISTORY:  Mother died of MI in 04-01-03.  She has multiple relatives  with diabetes.   SOCIAL HISTORY:  She lives alone.  She has 4 children.  She is out of  work with a 10th grade education.  She does not use alcohol, nor drugs.  She smokes about a third of a pack per day.   MEDICATIONS:  Include:  1. Aspirin 325 mg 1 p.o. daily.  2. Lantus 12 units q.h.s.  3. Lisinopril 20 mg p.o. daily.  4. Metformin 100 mg p.o. b.i.d.  5. Reglan 10 mg p.o. t.i.d.  6. Ultram p.r.n.  7. Nicotine patch.   ALLERGIES:  INCLUDE PERCOCET.  THE PATIENT IS INTOLERANT OF  MORPHINE.   VITAL SIGNS:  Initially, the patient was hypertensive with a blood  pressure of 201/133, pulse of 131, temperature 97.8, respiratory rate  20.  SPOT 96% on room air.  Patient was dose inclined in the ED and  repeat vital signs shows a blood pressure of 168/95 with a heart rate of  119.  GENERAL APPEARANCE:  The patient has no respiratory distress.  She is in  pain.  MENTAL STATUS:  Alert and oriented x3.  HEENT:  Head:  Normocephalic, atraumatic.  Eyes:  Pupils equally round  and reactive to light.  Extraocular movements are intact.  Ears:  Tympanic membranes within normal limits.  Mouth:  Mucous membranes are  mildly dry.  There is no oropharyngeal erythema that I can appreciated.  CHEST:  Clear.  Lungs have no wheeze.  HEART:  Tachy with a normal S1 and S2.  I do not appreciate a murmur.  ABDOMEN:  Soft.  She has soft bowel sounds.  She is tender to palpation  in the left upper quadrant.  She has no rebound.  She is not distended.  EXTREMITIES:  No edema.  She has 2+ dorsalis pedis pulses bilaterally.  RECTAL EXAM:  Heme negative.  Gastro  occult is heme positive.  NEUROLOGIC:  Grossly intact.  Motor x4.  Sensation is intact to gross  testing.  Balance and gait are not tested.  SKIN:  She has bilateral calluses over the first and fifth metatarsal.  They do not appear overtly infected with active erythema.  They are  mildly tender to palpation.  Adenopathy none appreciated.   LABORATORY TESTS:  Hemoglobin 11.9, white count 8.9, platelets 262, MCV  96.  Potassium 3.5, creatinine 0.9, BUN 14, glucose 322.  LFTs within  normal limits.  Urinalysis was a cathed sample, specific gravity 1.018,  greater than 1000 glucose, negative for nitrite and leukocyte esterase.  EKG shows tachycardia with no acute ST changes.  Abdominal films and  chest films are all pending, as are cardiac enzymes, lipase, repeat BMET  and a type and screen.  Review of clinic notes, show recent hemoglobin  A1c range from 8.7 to 13.7 over the past 16 months.   ASSESSMENT/PLAN:  Patient is a 45 year old female with the following  problems:  1. Nausea and vomiting with positive gastric occult.  She has a type      and screen pending, but we will treat her symptomatically with      Reglan, Phenergan and Protonix.  We will give her morphine p.r.n.      We will check abdomen and chest films to evaluate for free air      versus small bowel obstruction.  We will place an NG tube if the      nausea and vomiting persists or if she has a small bowel      obstruction that is persistently symptomatically.  This is likely      due to gastroparesis.  She may have a Mallory-Weiss tear +/-      esophagitis.  Viral gastroenteritis is less likely.  She has no      myalgias and no fevers, but we will rule out an myocardial      infarction given her possible atypical presentation.  We will      rehydrate her with IV fluids, make her n.p.o. and hold her aspirin.      We will follow her CBC. 2. Hypertension.  Blood pressure is improving and her heart  rate is      approaching  the upper limits of normal.  I will continue with IV      fluids and follow her blood pressure.  We will restart the ACE when      she is taking p.o. and rehydrated and add hydrochlorothiazide if      the ACE is insufficient.  She states she was not taking      hydrochlorothiazide as an outpatient.  If she is not taking p.o.,      but is hydrated, we could use IV labetalol if needed.  3. Diabetes.  Make her n.p.o. for now and check a BMET now and we will      follow CBGs q.4 hours.  We will treat her with insulin if her CBG      goes above 200 and add D5 to her IV fluids.  If she is less than      120, the RN has instructions to call the house officer based on      these values.  We will continue Lantus 12 units of subcu q.h.s. and      check her A1c.  4. Tobacco.  Continue nicotine patch.  The patient states she is      trying to quit and has been using nicotine patch at home.  5. Deep venous thrombosis prophylaxis.  No anticoagulation given due      to problem number 1.  Therefore, we will place the patient in      sequential compression devices.  6. Code status.  I have never really thought about it.  The patient      is a presumed full code and this can be readdressed by the      inpatient team.  7. Recheck fasting lipid panel in a.m.  8. Admit.      Elveria Rising Damita Dunnings, M.D.     GSD/MEDQ  D:  01/17/2007  T:  01/17/2007  Job:  [id number]   cc:   [name], M.D.

## 2011-05-14 NOTE — Discharge Summary (Signed)
NAME:  [name] [name], [name] [name] NO.:  [id number]   MEDICAL RECORD NO.:  [id number]          PATIENT TYPE:  INP   LOCATION:  [id number]                         FACILITY:  [facility]   PHYSICIAN:  Blane Ohara McDiarmid, M.D.DATE OF BIRTH:  [birth year]   DATE OF ADMISSION:  01/17/2007  DATE OF DISCHARGE:  01/20/2007                               DISCHARGE SUMMARY   PRIMARY CARE PHYSICIAN:  [name], M.D. at [facility]   CONSULTATIONS:  None.   PROCEDURES:  1. Chest x-ray on January 17, 2007 showed no acute cardiopulmonary      disease.  2. Acute abdominal series on January 17, 2007 showed constipation with      no active disease.  No obstruction or free air was visualized.   DISCHARGE DIAGNOSES:  1. Nausea and vomiting, resolved.  2. Diabetic gastroparesis.  3. Type 2 diabetes.  4. The peripheral neuropathy.  5. Hypertension.  6. Depression.  7. Tobacco dependence.  8. Anxiety.  9. Insomnia.   DISCHARGE MEDICATIONS:  1. Aspirin 81 mg p.o. daily, please hold until follow-up appointment      with Dr. [name] given Mack Guise tear disease.  2. Lantus 12 units subcu at bedtime.  3. Lisinopril 20 mg p.o. daily.  4. Metformin 1000 mg p.o. b.i.d.  5. Reglan 10 mg p.o. t.i.d. with meals as needed.  6. Ultram as previously prescribed as needed.  7. Phenergan 12.5 mg 1-2 tablets every 6 hours as needed for nausea or      vomiting - do not drive while taking.  8. MiraLax 17 grams p.o. daily for constipation.   PERTINENT LABORATORY DATA:  The patient had initial CBC on admission  which revealed a normal white blood cell count of 8.9, hemoglobin 11.9,  and hematocrit of 35.5, platelets of 262 and no evidence of left shift.  Comprehensive metabolic panel was within normal limits except for an  elevated glucose.  LFTs were all within normal limits with an AST of 22  and ALT of 15.  Urinalysis and microscopic urine showed no evidence of  infection.   Cardiac enzymes were cycled x3 given nausea and vomiting in  the presence of a diabetic and were negative with no acute EKG changes.  Lipase was normal at 29.  Hemoglobin A1c was 7.6%.  The patient was  Gastroccult positive but fecal occult blood negative.  Lipid profile;  fasting lipid panel showed total cholesterol 142, triglycerides of 79,  HDL of 51, and LDL of 75.  CBC prior to discharge showed a white blood  cell count of 5.4, hemoglobin 9.6, hematocrit of 27.8 and platelets of  209.  The patient's hemoglobin did reach nadir of 8.9 and was trending  upwards at the time of discharge.  Basic metabolic panel on the day of  discharge was within normal limits with a potassium of 4.2.  BUN of 4  and creatinine of 0.82.   BRIEF HOSPITAL COURSE:  Please see full dictated history and physical  for full details of admission presentation and initial workup.  In brief  this is  a 45 year old African American female with history of diabetes  and diabetic gastroparesis who presented with intractable nausea and  vomiting.   HOSPITAL COURSE:  Problem 1.  Nausea and vomiting:  Likely related to  diabetic gastroparesis triggered by the stress of the patient's oldest  son and his recent court date with impending incarceration.  The  patient's hemoglobin did drop to a nadir of 8.9 during this  hospitalization and the patient did have some blood streaking to her  emesis.  However, she was fecal occult blood negative and she remained  stable and asymptomatic.  Bleeding was felt likely secondary to [employer] tear versus esophagitis.  The patient was placed on Protonix  b.i.d. during the hospitalization and her aspirin was held and will be  held at discharge until further follow-up with Dr. [name].  The  patient was treated with IV fluid resuscitation as well as Phenergan IV  as needed which was changed to p.o. prior to discharge.  The patient was  initially tried on Zofran as well, however this  did not provide  significant relief thus it was discontinued.  The patient had no emesis  for more than 24 hours prior to discharge and was tolerating a full  diabetic diet.   Problem 2.  Abdominal pain:  Likely related to the patient's  gastroparesis and constipation.  The patient was given two enemas during  hospitalization and then given Senokot b.i.d. with a recommendation to  continue MiraLax at the time of discharge.  The patient was initially  given morphine p.r.n. for her abdominal pain but was no longer requiring  at the time of discharge.   Problem 3.  Headache:  The patient did complain of headache during the  hospitalization and was given Tylenol 1000 mg every 6 hours as needed.  NSAIDS were avoided given the patient's likely Mallory Weiss tear versus  esophagitis.   Problem 4.  Type 2 diabetes:  The patient's CBG's remained well  controlled with her home dose of Lantus and minimal sliding scale  requirement during hospitalization.  Her metformin was held while she  was n.p.o. until her p.o. intake increased.  The patient was discharged  on home dose of metformin and Lantus.   Problem 5.  Hypertension.  The patient's blood pressures remained fairly  well controlled on home dose of lisinopril.  They were mildly elevated  at times however this may have been secondary to pain and increased  stress.  Would recommend further outpatient follow-up and adjusting of  home dose lisinopril as needed.   Problem 6.  Tobacco dependence:  The patient was placed on nicotine  patch.   DISCHARGE INSTRUCTIONS:  The patient has no activity restrictions and is  to follow a diabetic, low sodium, heart healthy diet.   FOLLOW-UP APPOINTMENTS:  The patient has follow-up appointment with Dr.  [name] at [facility] on January 30, at 1:30  p.m.   PENDING RESULTS AND ISSUES TO BE FOLLOWED AT DISCHARGE: 1. Resume daily aspirin therapy if the patient is no longer  having      evidence of upper GI bleeding.  2. Monitor blood pressure and consider titration of lisinopril as      needed.   The patient was discharged home in stable condition.     ______________________________  Shella Maxim, M.D.    ______________________________  Blane Ohara McDiarmid, M.D.    EE/MEDQ  D:  01/22/2007  T:  01/22/2007  Job:  [id number]   cc:   [name], M.D.

## 2011-05-14 NOTE — H&P (Signed)
NAME:  [name] [name], [name] [name]               ACCOUNT NO.:  [id number]   MEDICAL RECORD NO.:  [id number]          PATIENT TYPE:  INP   LOCATION:  6713                         FACILITY:  [facility]   PHYSICIAN:  Tahoma A. Walker Kehr, M.D.    DATE OF BIRTH:  21-Nov-1966   DATE OF ADMISSION:  03/06/2006  DATE OF DISCHARGE:                                HISTORY & PHYSICAL   ATTENDING PHYSICIAN:  Wayne A. Walker Kehr, M.D.   RESIDENT:  Esperanza Richters, M.D.   CHIEF COMPLAINT:  Nausea, vomiting, and diarrhea.   [name] [name] is a 45 year old female, with past medical history outlined  below, who presents with a 1-day history of worsening nausea, vomiting,  diarrhea.  Yesterday she reports she had episode of cold sweats but no  fevers and then started vomiting and had multiple episodes of emesis prior  to coming to the emergency department.  Per the patient, there is no blood  in her emesis or stool, but the ED report says that she initially complained  of blood in her emesis.  She also had some mid epigastric pain and some  chest fullness. She also complains of some abdominal distention as well.  In the emergency department, she was given potassium, Reglan, placed on  Glucomander, and also given Zofran and Altace.   REVIEW OF SYSTEMS:  As in HPI.  Additionally, negative for fevers or chills.  Positive for cold sweats.  Positive for chest fullness, negative for chest  pain.  Positive for shortness of breath.  Negative for dysuria or vaginal  discharge. Negative for skin rashes.  Positive for general weakness but  negative for focal weakness.  Negative for altered mental status.   PAST MEDICAL HISTORY:  Significant for:  1.  Diabetes mellitus, type 2.  2.  Hypertension.  3.  History of depression.  4.  Diabetic neuropathy.  5.  Tobacco abuse.  6.  Anxiety.  7.  Diabetic gastroparesis.  8.  History of frequent recurrent UTIs.  9.  History of diabetic foot ulcer.   PAST SURGICAL HISTORY:  1.  Cholecystectomy  with exploratory laparotomy in 2002.  2.  Total abdominal hysterectomy with lysis of adhesions in 2003.  3.  EGD showing esophagitis and gastroparesis in 2005.   ALLERGIES:  PERCOCET.   MEDICATIONS:  1.  Altace 5 mg p.o. daily.  2.  Avandamet 500/2 two in the a.m., one in the p.m.  3.  Epinephrine pen to be used p.r.n. anaphylaxis.  4.  NovoLog 70/30, 16 units in the morning, 16 units in the evening.  5.  Phenergan 25 mg p.r. p.r.n. nausea, vomiting.  6.  Reglan 10 mg p.o. before meals or food and nightly.  7.  Ultram 50 mg q. 4-6 h p.r.n. pain.  8.  Zelnorm 6 mg 3 times a day x1 month, apparently not taking this anymore.   FAMILY HISTORY:  Mother died of heart attack.   SOCIAL HISTORY:  She is in a monogenous relationship.  She has 4 children, 3  sons and 1 daughter.  Her daughter lives with her.  She lives with  a cousin  and does not work.  She only has a 10th grade education.  She denies alcohol  but admits to 1/2 pack per day cigarettes usage and occasional marijuana.   PHYSICAL EXAMINATION:  VITAL SIGNS:  Temperature 97.7, blood pressure  196/99, heart rate 96, respirations 24, O2 saturation 100% on room air.  GENERAL:  She is very sleepy secondary to medications and appears  uncomfortable when awakened.  She is quite sleepy, so it is difficult to  assess her mental status.  HEENT:  Normocephalic and atraumatic.  Pupils equal, round, and reactive to  light. Extraocular movements are intact.  Oropharynx without erythema or  exudate, and her mucous membranes are now moist.  LUNGS: She has poor effort secondary to sedation but no obvious crackles or  wheezes.  CARDIOVASCULAR:  She has a 2/6 systolic ejection murmur but a regular rate  and rhythm.  She has 2+ distal pulses.  ABDOMEN: Soft, mildly tender to palpation, especially in the mid epigastric  region.  She has no rebound, no guarding, and it is noted that she has a  midline scar.  EXTREMITIES:  No edema.  NEUROLOGIC:  Cranial nerves II-XII intact.  Her strength is 5/5 bilateral  upper and lower extremities, but she appears to have diminished DTRs  bilaterally.  Her gait was not assessed secondary to sedation.  SKIN:  She has no active lesions, multiple healed scratches on the back.   LABORATORY DATA:  BMET: Sodium 137, potassium 2.9, chloride 99, bicarb 27,  BUN 12, creatinine 0.9, glucose 346, calcium 9.6.  CBC: White count 10.7,  hemoglobin 13, hematocrit 38.6, platelets 256, 78% neutrophils.  ABGs showed  a pH of 7.42, pO2 36.3, O2 98.8, bicarb 23.1, saturation 97%.  Urinalysis:  Abnormalities show greater than 1000 glucose, greater than 80 ketones,  moderate blood, 100 protein, 7 to 10 red blood cells, many bacteria, no  leukocyte esterase and no nitrite.   ASSESSMENT AND PLAN:  A 45 year old female with:  1.  Nausea, vomiting: This is most likely secondary to gastroparesis given      her history, but she does have a midline scar and history of adhesions,      and small-bowel obstruction cannot be ruled out yet.  I will obtain an      acute abdominal series to be sure.  I will give her Phenergan p.r.n. and      consider NG tube if emesis continues.  She is n.p.o. for now, so we will      give IV fluids half normal saline with 40 mEq potassium at 150 mL an      hour.  A viral etiology is also possible given recent outbreaks of viral      gastroenteritis; however, she is afebrile and has no known sick      contacts.  2.  Questionable hematemesis: Will Gastroccult her vomitus and check      hemoccult of all stools.  3.  Hypertension: She has no obvious signs of urgency or emergency, but      patient's urinary exam was limited by sedation.  Will treat with IV      labetalol initially and restart p.o. home medications when she is able      to keep down medications.  4.  Diabetes mellitus type 2.  The ED staff initially felt she was in      diabetic ketoacidosis, but she had a normal pH as well as a  normal  bicarb.  I suspect her blood glucose will improve with IV hydration.  I      am going to discontinue the Glucomander and start sliding scale insulin      protocol.  Will restart her home medications when she is taking good      p.o.  5.  Anxiety and depression: She is not currently on any medications.  I      cannot really assess this right now secondary to her sedation.  We will      continue to monitor.  6.  Heart murmur:  This was reported previously in October 2004, but      subsequent H&Ps do not record it.  I imagine it could be possibly      secondary to dehydration, but given the history, will obtain baseline 2-      D echocardiogram while she is in the hospital.  7.  Tobacco abuse: I am going to use nicotine patches and get smoking      cessation consult.  8.  Chest pressure, likely secondary to emesis, but I am going to check an      EKG and cardiac enzymes to be sure.  9.  Prophylaxis: I am going to give Protonix and SCDs.  10. Hypokalemia: She has a Port-A-Cath, so I am going to give __________ and      recheck it at 6 p.m.  11. Bacteruria:  She has no leukocyte esterase and no nitrite.  I suspect      this is asymptomatic bacteruria and do not feel she needs treatment      because she is asymptomatic at this time.      Esperanza Richters, MD    ______________________________  Arty Baumgartner Walker Kehr, M.D.    JT/MEDQ  D:  03/06/2006  T:  03/07/2006  Job:  [id number]   cc:   Eliezer Lofts, MD  Fax: [phone]

## 2011-05-14 NOTE — Discharge Summary (Signed)
NAME:  [name] [name], [name] [name]               ACCOUNT NO.:  [id number]   MEDICAL RECORD NO.:  [id number]          PATIENT TYPE:  INP   LOCATION:  6703                         FACILITY:  [facility]   PHYSICIAN:  [name], M.D.DATE OF BIRTH:  [birth year]   DATE OF ADMISSION:  04/03/2007  DATE OF DISCHARGE:  04/05/2007                               DISCHARGE SUMMARY   PRIMARY CARE PHYSICIAN:  [name], M.D.,  [name].   DISCHARGE DIAGNOSES:  1. Nausea and vomiting, resolved.  2. Acute pancreatitis, mild and resolved.  3. Uncomplicated urinary tract infection.  4. Type 2 diabetes with complications of neuropathy and gastroparesis.  5. Hypertension.  6. History of depression and anxiety.  7. History of tobacco abuse.  8. History of migraines   DISCHARGE MEDICATIONS:  1. Lantus 12 units subcutaneously nightly.  2. Metformin 1000 mg p.o. b.i.d. with food.  3. Reglan 10 mg p.o. before meals or food and nightly.  4. Lisinopril 20 mg p.o. daily as previously prescribed.  5. Vicodin 5/500 mg 1-2 tablets every 6 hours as needed for pain,      dispensed 10 with no refills.   CONSULTATIONS:  None.   PROCEDURES:  Acute abdominal series on admission marked April 03, 2007,  showed no active cardiopulmonary or abdominal prostheses. Bowel gas  pattern was nonobstructive.   PERTINENT LABORATORY DATA:  CBC on admission showed an elevated white  blood cell count of 15.6, hemoglobin 11.7, hematocrit of 35.0 and  platelets of 280 with a left shift of 83% neutrophils and an absolute  neutrophil count of 13.0.  Comprehensive metabolic panel showed a sodium  of 139, potassium 3.6, chloride 103, bicarb 25, glucose 221, BUN 18,  creatinine 0.96, total bilirubin 0.8, alkaline phosphatase 53, AST 17,  ALT 11, total protein 7.3, albumin 4.2 and calcium 9.7.  Lipase was  elevated at 152.  Urinalysis showed specific gravity 1.020, 100 of  glucose, 40 of ketones, greater than 300  of protein, positive nitrate  and small leukocyte esterase.  Microscopic revealed a few squamous  epithelial cells, 3-6 white blood cells, 7-10 red blood cells and many  bacteria.  Urine culture preliminarily shows E. coli. At the time of  discharge, sensitivities are pending.  LDH on admission was normal and  119.  After IV fluids for dehydration, the patient's repeat CBC showed a  decreased hemoglobin to 9.4 and hematocrit of 27.2.  White blood cell  count gradually decreased to 3.5 at the time of discharge.  Hemoglobin  and hematocrit remained stable at the time of discharge at 9.3 and 26.4,  respectively.  Electrolytes were also stable on the day of discharge.  Triglycerides were checked given acute pancreatitis and found to be 47  on the day of discharge.  Repeat lipase prior to discharge was normal at  23.   BRIEF HOSPITAL COURSE:  Please see full dictated History and Physical  for full details of initial presentation and workup.  In brief,  this is  a 45 year old African-American female with type 2 diabetes and history  of poor  control with complications of neuropathy and gastroparesis who  presented with intractable nausea and vomiting.   #1.  NAUSEA AND VOMITING:  This resolved on the day of discharge with  the patient tolerating a regular diet without any signs or symptoms of  nausea and vomiting.  The patient's nausea and vomiting were likely  secondary to acute pancreatitis and/or gastroparesis.  The patient was  initially admitted and given bowel rest, being n.p.o. with IV fluid  resuscitation as well as maintenance IV fluids.  The patient's diet was  gradually advanced as tolerated.  The patient's symptoms were also  treated with Phenergan and Reglan as needed with the addition of  Compazine p.o.  The patient's antiemetics were transitioned to oral and  achieved adequate control prior to discharge.   #2.  PANCREATITIS:  The patient had elevated lipase at 152 on  admission;  however, based on Ranson criteria, pancreatitis with mild.  The  patient's white count was less than 16,000, and LDH was normal.  The  patient's only Ranson criteria point with for glucose greater than 200.  The patient remained afebrile throughout admission, and white blood cell  count demonstrated steady decline.  Thus, no further imaging was  obtained.  The patient's symptoms resolved very quickly including her  epigastric pain which was significantly improved by discharge, and the  patient's diet was advanced fairly rapidly to a full diet as tolerated.   #3.  ELEVATED WHITE BLOOD CELL COUNT ON ADMISSION:  Most likely  secondary to pancreatitis and/or urinary tract infection.  The patient  completed a 3-day course of ciprofloxacin prior to discharge.  The  patient remained afebrile and was asymptomatic without any dysuria.  The  patient's white blood cell count did decrease to 3.5 prior to discharge.  Should the patient have urinary symptoms, would recommend following up  sensitivities on urine culture as these are pending at the time of  discharge.   #4.  TYPE 2 DIABETES:  The patient's metformin dose was held on  admission.  The patient was continued on her home dose of Lantus, as  well was covered with sliding scale insulin.  The patient's CBCs were  occasionally low in the 60s; however, the patient was not eating as she  does at home, and she was not getting a sufficient nighttime snack. As  well, the patient was getting nightly sliding scale insulin coverage.  Recommend the patient resume her previous home regimen of metformin and  Lantus at the time of discharge as she will be eating more at home and  is to strictly record fasting morning sugars as well as nightly sugars  and bring these to her followup appointment on Monday with Dr.  [name].   #5.  HYPERTENSION:  The patient's blood pressure medicines were initially held as her blood pressures were fairly well  controlled given  morphine for her abdominal pain.  As this was transitioned to oral pain  medications, her blood pressure steadily rose prior to discharge to  140s/80s.  Thus feel the patient will tolerate resuming her lisinopril  at the time of discharge.  There is some question as to whether or not  the patient should also be on Norvasc.  The patient states she is only  on lisinopril at home. Will leave this discretion to her primary care  physician at followup.   #6.  ABDOMINAL PAIN:  Likely secondary to either nausea and vomiting  with gastroparesis and or pancreatitis.  This improved  significantly by  the time of discharge.  Acute abdominal series on admission was negative  for any obstruction or perforation.   #7.  ANEMIA:  The patient's hemoglobin and hematocrit remained fairly  stable ranging from  9.3-9.9 and 26.4-29.3, respectively.  In looking  back through the patient's admissions for the past year, it appears the  patient has been persistently anemic presumed on previous admissions  secondary to hemodilution as well as Mallory-Weiss tears.  The patient  has not had anemia panel in the hospital setting within the last year.  Thus, would recommend considering this in the outpatient setting when  the patient does not have reason for artificially elevated ferritin.   PENDING RESULTS AND ISSUES TO BE FOLLOWED AT THE TIME OF DISCHARGE:  1. Hypertension:  The patient was unclear about her dose of lisinopril      and whether or not she should be taking Norvasc.  Patient to bring      blood pressure medicines to her followup visit.  2. Follow CBGs in outpatient setting as the patient had occasional      lows while in the hospital but suspect she will be fine on her      previous home regimen.  The patient is to bring recorded blood      sugars at her followup visit.  3. UTI:  The patient is status post 3 days of Cipro while in the      hospital.  However, sensitivities are  pending at the time of      discharge.  If the patient has symptoms of dysuria or hematuria,      would recommend following up sensitivities.  4. Anemia:  Would recommend considering workup in the outpatient      setting for normocytic anemia as this has not been done in the      hospital setting.   DISCHARGE INSTRUCTIONS:  The patient is to follow a diabetic diet.  She  will resume activity as tolerated.   FOLLOWUP APPOINTMENTS:  The patient has followup with Dr. [name] at  [facility] on Monday, April 10, 2007, a 4:15 p.m.   The patient was discharged home in stable condition.     ______________________________  Shella Maxim, M.D.    ______________________________  Jamal Collin. Andria Frames, M.D.    EE/MEDQ  D:  04/05/2007  T:  04/05/2007  Job:  [id number]   cc:   [name], M.D.

## 2011-05-14 NOTE — H&P (Signed)
NAME:  [name] [name], [name] [name]                         ACCOUNT NO.:  [id number]   MEDICAL RECORD NO.:  [id number]                   PATIENT TYPE:  INP   LOCATION:  5727                                 FACILITY:  [facility]   PHYSICIAN:  Manus Rudd, MD                    DATE OF BIRTH:  [birth year]   DATE OF ADMISSION:  01/25/2004  DATE OF DISCHARGE:                                HISTORY & PHYSICAL   PRIMARY CARE PHYSICIAN:  Dr. Karmen Bongo, at [facility].   CHIEF COMPLAINT:  Nausea, vomiting.   HISTORY OF PRESENT ILLNESS:  [name] was recently discharged after  admission for gastroenteritis.  She returns now with a continued two day  history of nausea and vomiting with coffee ground emesis which is non-  bilious.  She complains of severe upper abdominal pain which is stabbing,  intermittent and non-radiating and no shortness of breath.  She denies  fevers.  Denies diarrhea.  The patient denies any recent NSAID use and  denies any recent alcohol consumption.  Obtaining a history and physical on  this patient was very difficult as she was so uncomfortable that she was  unwilling to participate in most of the history and physical taking.   REVIEW OF SYSTEMS:  No fever.  Positive mid epigastric pain, positive  nausea, vomiting, positive dry throat.  No blood in the urine or stool.  No  rash.  No shortness of breath.   PAST MEDICAL HISTORY:  1. Diabetes mellitus.  2. Hypertension.  3. History of depression.  4. Diabetic neuropathy.  5. Tobacco abuse.  6. Anxiety.  7. Insomnia.  8. Gastroparesis.  9. Multiple hospitalizations for pyelonephritis.   MEDICATIONS:  1. Avandamet 4/1,000 mg p.o. b.i.d.  2. Epinephrine injections p.r.n. for bee stings.  3. Glucotrol 10 mg b.i.d.  4. Lantus 20 mg q.a.m.  5. Lexapro 10 mg p.o. every day.  6. Neurontin 600 mg t.i.d.  7. Ramipril 20 mg every day.  8. Trazodone 50 mg p.o. q.h.s. p.r.n. insomnia.   ALLERGIES:  PERCOCET.   SOCIAL HISTORY:  She has four children, twin sons 43, a son 104 and a  daughter. The daughter lives with her.  She lives with a cousin.  Positive  tobacco.  Positive marijuana.  Negative alcohol.  Negative NSAID use.   FAMILY HISTORY:  Mother died of an MI in 05/22/03.   PHYSICAL EXAMINATION:  VITAL SIGNS:  Temp 97.9, respirations 22, pulse 101,  BP 189/100.  O2 97 on room air.  GENERAL:  She is a 45 year old African American female in extreme discomfort  and distress.  HEENT:  Eyes are PERRLA.  No lymphadenopathy.  NG tube is in place, am  unable to visualize the oropharynx.  CV:  Slightly tachycardic.  No murmurs, rubs or gallops.  LUNGS:  Clear to auscultation bilaterally.  No wheezes, crackles or rales.  ABDOMEN:  No bowel sounds heard; however, difficult to assess as the patient  was constantly moving.  Abdomen is very soft, nondistended, slightly obese  and with exquisite tenderness over the mid epigastric area.  There is no  rebound and no guarding.  EXTREMITIES:  No edema.  Pulses 2+.  MUSCULOSKELETAL:  Strength 5/5 in all extremities.  NEUROLOGIC:  Cranial nerves II-XII are grossly intact.  Gait within normal  limits.  RECTAL:  Good sphincter tone.  Heme negative.   LABORATORY:  WBC 9.1, hemoglobin 15.3, hematocrit 44.7, platelets 227.  Sodium 137, potassium 3.0, chloride 102, CO2 23, BUN 6, creatinine 0.6,  platelets 277.  AST 19, ALT 20, alk phos 72.  T-bili 0.6.  Serum pregnancy  negative.  PT 11.8, INR is 0.8, PTT 29.  Blood cultures pending.  Amylase  139, lipase 34.  Urine drug screen positive for opiates and marijuana.  UA:  Greater than 1,000 glucose, 30 of protein, 40 of ketones, nitrate  negative,  LE negative, micro within normal limits.   ASSESSMENT/PLAN:  This is a 45 year old African American female with  diabetes mellitus, nausea, vomiting and hematemesis.  1. Nausea, vomiting, hematemesis.  The patient likely has a Mallory-Weiss     tear secondary to  emesis and this is leading to her hematemesis.     Gastroenterology was consulted, and they will see the patient in the     morning.  She remains hemodynamically stable.  She has been typed and     crossed, and I will continue to re-hydrate the patient with intravenous     fluids, as I believe she is hemoconcentrated.  I will start Protonix 40     mg twice a day and will continue the nasogastric tube.  As far as the     etiology of her nausea and vomiting, she could have persistent     gastroenteritis, but we need to rule out obstruction.  There are no     air/fluid levels on abdominal x-ray; however, the x-ray showed that the     patient is severely constipated.  I will give an enema in an attempt at     relieving the constipation which may in turn relieve her nausea and     vomiting.  I will continue Zofran and Phenergan intravenously as needed.     The patient is also a diabetic with known gastroparesis and this could     also be contributing to her nausea and vomiting.  I do not believe,     however, this represents ischemic bowel or any acute abdomen.  2. Abdominal pain.  We do need to rule out inferior myocardial infarction as     a cause of her abdominal pain.  The patient is writhing in bed on exam,     however, was comfortable to sleep within minutes of receiving 4 mg of     Zofran.  We will continue Zofran and Phenergan.  I will get an     electrocardiogram and will provide pain medications sparingly and as     needed.  3. Hypertension.  The patient's blood pressure is increased but this is     likely secondary to pain and emesis.  We will ask for a repeat check on     her blood pressure, and we will hold p.o. blood pressure medicines for     now and give intravenous Labetalol as needed.  4. Diabetes mellitus.  As the patient will be  NPO, I will hold her insulin     and oral medicines and cover her with sliding scale insulin as needed. 5. Tobacco abuse.  The patient is counseled  on the benefits of tobacco     smoking cessation.  She is not willing to quit at this time.  6. Hypokalemia.  Since the patient is not tolerating oral, I will add 40 mEq     of potassium to her fluids.                                                Manus Rudd, MD    SJ/MEDQ  D:  01/25/2004  T:  01/26/2004  Job:  [id number]

## 2011-05-14 NOTE — H&P (Signed)
NAME:  [name] [name], [name] [name]                         ACCOUNT NO.:  [id number]   MEDICAL RECORD NO.:  [id number]                   PATIENT TYPE:  INP   LOCATION:  1825                                 FACILITY:  [place]   PHYSICIAN:  Reginia Forts, M.D.                  DATE OF BIRTH:  [birth year]   DATE OF ADMISSION:  10/19/2003  DATE OF DISCHARGE:                                HISTORY & PHYSICAL   CHIEF COMPLAINT:  Vomiting and diarrhea.   SUBJECTIVE:  [name] [name] is a 45 year old African-American female with a past  medical history significant for diabetes mellitus type 2, hypertension,  tobacco abuse, presenting with a 10-hour history of severe vomiting with  diarrhea.  Patient awoke on the day of presentation at 3:30 in the morning  with severe vomiting and diarrhea.  Vomit is green-yellow in color, and  patient has had 10 to 15 episodes of small to large vomit episodes.  Patient  developed diarrhea approximately 30 minutes after the onset of vomiting.  Diarrhea is clear and intermittently green, however, is nonbloody and non-  mucous.  Positive chills and sweats, however, patient reports this is  chronic and she does not objectively check a temperature.  Positive severe  abdominal pain located in the epigastric region.  Patient describes the pain  as burning and radiates down into the substernal region.  No sick contacts,  no diaphoresis; however, patient does admit to some intermittent dizziness.  No abnormal or questionable food that she has ingested.  No other family  members have had similar symptoms.   REVIEW OF SYSTEMS:  Positive chills and sweats.  She had chronic no  objective fevers.  CARDIOVASCULAR:  Positive epigastric, substernal pain.  No heart palpitations.  RESPIRATORY:  Mild shortness of breath, however, no  cough or wheezing.  No rashes, no weakness, no headache, no neck pain.  No  joint pains.  Patient is complaining of being thirsty.  Patient does report  she had a mild  sore yesterday morning, however, no rhinorrhea or nasal  congestion.  No dysuria, no vaginal discharge, no bloody stools.   PAST MEDICAL HISTORY:  1. Diabetes mellitus type 2.  2. Hypertension.  3. Depression with anxiety.  4. Tobacco abuse.  5. History of gastroparesis.  6. History of diabetic neuropathy.   PAST SURGICAL HISTORY:  1. C-section x3.  2. Status post BTL.  3. Patient has had three therapeutic abortions.  4. Cholecystectomy in 2002.  5. Exploratory laparotomy in 2002.  6. Total abdominal hysterectomy in 2003.  7. Small bowel obstruction in May of 2003 as well.  8. Patient had pyelonephritis and was admitted in June of 2003.   ALLERGIES:  PERCOCET which causes itching.   MEDICATIONS:  1. Ambien 10 mg p.o. q.h.s.  2. Epinephrine p.r.n. bee stings.  3. Glucotrol 10 mg p.o. b.i.d.  4. Neurontin 600 mg p.o. t.i.d. p.r.n.  5. Ramipril 20 mg p.o. every day, however, patient does admit she does not     take this on a regular basis.  6. Avandomet, unknown dose, p.o. b.i.d.   FAMILY HISTORY:  Noncontributory.   SOCIAL HISTORY:  Single and has four children.  The patient has twin sons  that are 71 years old.  She has a 53 year old son and she has a 59 year old  daughter.  Patient currently lives with her sister and is unemployed.  She  has a tenth grade education.  She denies physical abuse in the past.  Positive tobacco abuse, one pack every three days.  Denies alcohol or drugs,  however, does have a remote history of marijuana use.  Patient currently is  sexually active and has had the same partner for the past one year.   PHYSICAL EXAMINATION:  Temperature 98.5, pulse 117, blood pressure 203/113,  repeat was 111/97, respirations 20, O2 sat is 100% on room air.  GENERAL:  Well-developed, well-nourished African-American female in mild to  moderate distress secondary to abdominal pain and vomiting.  Patient would  rock in the bed and move around in the bed secondary to  significant pain and  fear of vomiting.  Patient was somewhat non-cooperative during examination  due to pain and vomiting.  She is alert and oriented x3.  HEENT:  Pupils are equal, round and reactive to light.  Sclerae is muddy,  however, nonicteric.  Poor dentition is noted.  Oropharynx is mildly  erythematous, however, no exudate or cobblestoning.  Mucous membranes are  slightly dry.  NECK:  Supple without lymphadenopathy.  No thyromegaly.  No nuchal rigidity  and there is full range of motion of her neck.  CARDIOVASCULAR:  Regular rate and rhythm with a 2/6 systolic murmur heard  best at the left sternal border.  LUNGS:  Clear to auscultation bilaterally.  No rales, rhonchi or wheezing.  No respiratory distress appreciated.  ABDOMEN:  Soft and obese.  It is nondistended.  Hypoactive bowel sounds.  Patient is tender to palpation in the right upper quadrant and epigastric  region.  There is no guarding or rebound appreciated.  EXTREMITIES:  No cyanosis, clubbing or edema.  SKIN:  There is no rash or petechiae.  Patient does have multiple large  calluses on feet on the ventral surfaces bilaterally.  NEUROLOGIC:  Patient is moving all extremities equally.  Cranial nerves II-  XII are grossly, however, the patient was somewhat non-cooperative during  the neurological exam.  BACK:  Patient is tender to palpation at the left CVA region.  RECTAL:  Deferred.   LABS:  White blood cell 14.2, hemoglobin 15.4, hematocrit 44, platelets 220,  neutrophils 82%.  Sodium 138, potassium 3.5, chloride 101, bicarb 29, BUN  16, creatinine 0.8, glucose 425, lipase 38.  Urinalysis revealed specific  gravity of 1.030, pH of 6.5, glucose greater than 1000, moderate hemoglobin,  ketones greater than 80, nitrite negative with leukocyte esterase negative.   Acute abdominal series revealed positive gas in bowel, no free air, and no pulmonary disease or acute process.   ASSESSMENT AND PLAN:  The patient is a  45 year old African-American female  with a past medical history significant for diabetes mellitus type 2 with  diabetic neuropathy and diabetic gastroparesis, hypertension, anxiety and  depression presenting with severe vomiting and diarrhea with abdominal pain.   1. Vomiting and diarrhea:  Differential diagnoses include viral     gastroenteritis, small bowel obstruction, ischemic bowel, pyelonephritis,  gastroparesis, pancreatitis, nephrolithiasis.  Abdominal exam is stable     currently despite reported severe pain.  We will admission patient for     observation secondary to severe pain and intractable vomiting and     diarrhea.  We will place the patient on bowel rest and allow her only     sips and ice chips.  Will initiate IV fluids and Protonix as well as an     antiemetic.  We will perform serial abdominal exams during the     hospitalization.  The acute abdominal series did not reveal an     obstructive process making surgical indication at this time unlikely.  No     peritoneal signs currently and patient is actually writhing in pain and     rocking in the bed, which makes nephrolithiasis as well as pancreatitis     more likely.  We will repeat a lipase as well as perform a urine culture     and CMET as well.   1. Moderate dehydration:  The patient presenting tachycardic.  Dehydration     is secondary to vomiting and diarrhea.  Patient is currently     hemodynamically stable, however, we will aggressively rehydrate her with     IV fluids until she can tolerate p.o.  Will monitor her ins-and-outs     closely.   1. Diabetes mellitus type 2:  Patient presenting hyperglycemic currently.     Likely secondary to infection.  We will hold oral medications currently     until patient can tolerate p.o.  We will cover her sugars with sliding     scale insulin.  Will follow her CBGs every six hours while NPO.     Hydration should improve her sugars acutely.  Will also check a      hemoglobin-A1c to evaluate her glucose control for the past three months.     Gastroparesis is likely contributing to her current presentation     considering her diabetes.   1. Hypertension:  We will currently hold ACE inhibitor due to patient's     dehydration and acute illness.  We will treat as indicated for elevated     blood pressures.   1. Anxiety/depression:  Patient is currently not on any medication.  Will     discuss this issue further with her once she is in a less-ill state and     will also discuss this with primary care physician in the future.   1. Tobacco abuse.  Will recommend cessation and consider consulting the     Psych Physician team during hospitalization.   1. Code status:  Patient is a full code.                                                Reginia Forts, M.D.    KS/MEDQ  D:  10/19/2003  T:  10/19/2003  Job:  [id number]

## 2011-05-14 NOTE — Discharge Summary (Signed)
NAME:  [name] [name], [name] [name]                         ACCOUNT NO.:  [id number]   MEDICAL RECORD NO.:  [id number]                   PATIENT TYPE:  INP   LOCATION:  5727                                 FACILITY:  [facility]   PHYSICIAN:  [name], M.D.             DATE OF BIRTH:  Oct 07, 1966   DATE OF ADMISSION:  01/25/2004  DATE OF DISCHARGE:  01/29/2004                                 DISCHARGE SUMMARY   DISCHARGE DIAGNOSES:  1. Nausea and vomiting.  2. Dehydration.  3. Gastroparesis.  4. Erosive esophagitis.  5. Gastroesophageal reflux.  6. Diabetes mellitus.  7. Hypertension.  8. Depression.  9. Anxiety.  10.      Tobacco abuse.   CONSULTATIONS:  Dr. Docia Chuck. Henrene Pastor from gastroenterology assisted with this  patient and performed an EGD.   PROCEDURES:  Esophagogastroduodenoscopy performed by Dr. Henrene Pastor showed  esophageal inflammation as a result of reflux, defined as severe with  ulcerations present.  A hiatal hernia was also found.  She was diagnosed  with esophagitis, reflux, gastroparesis, gastroesophageal reflux disease.   IMAGING:  1. CT scan of the abdomen and pelvis showed shotty adenopathy in the     mesentery and retroperitoneum.  Inflammatory process such as enteritis     may be present.  There was a cystic area in the left adnexa seen and     previously described on a prior CT that was not significantly changed.     There was bladder distention.  There was a small amount of nonspecific     free fluid as well.  2. Acute abdominal series with chest x-ray showing no active cardiopulmonary     disease.  There was constipation.   LABORATORY DATA:  1. CBC at discharge showed a white blood cell count of 6.0, hemoglobin of     13, hematocrit of 37.9, platelet count of 233,000.  2. Basic metabolic panel at discharge showed a sodium of 135, potassium 4.1,     chloride of 105, CO2 of 26, glucose of 153, BUN of less than 1,     creatinine of 0.6, calcium of 8.3.  3. Blood  lipase was normal at 19.  4. Hepatic function panel showed a total bilirubin of 1, alkaline     phosphatase of 61, SGOT of 20, SGPT of 15, total protein of 6.4 and     albumin of 3.3.  5. Cardiac panel x1 was negative, which showed a CK of 237, MB of 1.5,     relative index of 0.6 and a troponin I of 0.02.  6. Blood cultures x2 showed no growth to date at time of discharge.  7. Fecal occult blood testing was negative x1.  8. Urine drug screen showed positive THC and opiates.  9. Urine pregnancy test was negative.  10.      Urinalysis showed greater than 1000 glucose, 40 ketones, moderate  blood, 30 of protein, specific gravity 1.022, but otherwise clear.     Microscopy showed 0 to 2 red blood cells, a few squamous cells.   HOSPITAL COURSE:  PROBLEM #1 - EROSIVE ESOPHAGITIS:  Ms. Wilensky is a 45-year-  old African American who is a bounce-back to our service.  She was admitted  early this month for a similar presentation.  She presented with a 2-day  history of nausea and vomiting with coffee-grounds emesis which was non-  bilious.  She had severe upper abdominal pain described as stabbing,  intermittent and non-radiating with no shortness of breath.  She had no  diarrhea.  Workup included an EGD performed by Dr. Henrene Pastor with the results  stated above.  She was treated with double-dose proton pump inhibitor,  Phenergan, Reglan, Maalox, IV fluids, Zofran, as well as treatment for  constipation found on plain films with Dulcolax suppository and docusate  sodium.  She was initially kept n.p.o., but her diet was gradually advanced  to full diet, which she tolerated at the time of discharge.  She will be  sent home on Reglan, proton pump inhibitor to take by mouth.   PROBLEM #2 - DIABETES MELLITUS:  The patient's sugars were kept under  moderate control with sliding-scale insulin while she was kept n.p.o. for  problem #1.  Her sugars fluctuated from a low of 78 all the way up to 200s.  Her  home oral medications were withheld during this stay.  At the time of  discharge, we will keep her on her home Lantus dose of 20 units daily.  She  was advised to withhold her oral medications until a few days later when she  is up to her full regular diet.  She is to continue to check her sugars at  home.   PROBLEM #3 - HYPERTENSION:  The patient was slightly hypertensive here in  the hospital with a blood pressure ranging from 140s over 70s to 90s.  She  was restarted on an ACE inhibitor while here in the hospital.  Blood  pressure at discharge was 148/89.   PROBLEM #4 - ANXIETY:  Patient's SSRI was withheld during her  hospitalization.  She may restart this as an outpatient.   DISCHARGE MEDICATIONS:  1. Lantus 20 units subcu daily.  2. Lexapro 10 mg daily.  3. Neurontin 600 mg t.i.d.  4. Ramipril 20 mg daily.  5. Trazodone 50 mg p.o. nightly p.r.n. insomnia.  6. Maalox 30 mL p.o. q.a.c. and nightly p.r.n.  7. Reglan 10 mg p.o. q.a.c. and nightly.  8. Protonix 40 mg p.o. b.i.d.  9. Laxative of choice as needed for constipation.   PAIN MANAGEMENT:  Tylenol as needed.   ACTIVITY:  No restrictions.   DIET:  She is to maintain a diabetic diet that is low in salt, low in sugar,  fat and cholesterol.   WOUND CARE:  Not applicable.   FOLLOWUP:  The patient has an appointment to see Dr. Karmen Bongo on  February 10, 2004, which she is to keep.      Vianne Bulls, M.D.                            William A. Andria Frames, M.D.    DV/MEDQ  D:  01/29/2004  T:  01/30/2004  Job:  [id number]

## 2011-05-14 NOTE — Discharge Summary (Signed)
[place]  Patient:    [name] [name], [name] [name] Visit Number: [id number] MRN: [id number]          Service Type: SUR Location: [id number] Attending Physician:  Sherolyn Buba Dictated by:   Candee Furbish. Bubba Camp, M.D. Admit Date:  03/25/2002 Discharge Date: 03/30/2002   CC:         Saralyn Pilar L. Bubba Camp, M.D. (2 copies)   Discharge Summary  ADMISSION DIAGNOSES: 1. Partial small-bowel obstruction. 2. Type 2 diabetes mellitus.  DISCHARGE DIAGNOSES: 1. Partial small-bowel obstruction. 2. Type 2 diabetes mellitus.  PROCEDURES: 1. Exploratory laparotomy with adhesiolysis performed on March 21, 2002. 2. Placement of central line for TNA on March 21, 2002. 3. TNA starting on March 22, 2002, and continuing through April 1. 2003.  COMPLICATIONS:  None.  The patient did have a prolonged postoperative ileus.  CONDITION UPON DISCHARGE:  Improved.  HISTORY:  This patient is a 45 year old woman who was readmitted to the [facility] on March 17, 2002, which is approximately 24 hours after her discharge status post total abdominal hysterectomy and right salpingo-oophorectomy for fibroid uterus.  It was also noted at that time that she had a large right-sided tubo-ovarian abscess which was sterile.  HOSPITAL COURSE:  On admission, she was having severe nausea and vomiting, complained of abdominal distention and pain.  Plain films of the abdomen at that time demonstrated dilated loops of small bowel with some small bowel gas and some stool.  She was admitted, placed on nasogastric drainage, and observed.  She had some slight clinical improvement, passed some flatus, and did have a small stool.  However, that improvement was short lived, and she continued to have episodes of distention once the nasogastric tube was removed.  She also began vomiting again.  CT scan of the abdomen showed a persistent partial small-bowel obstruction in the area of the  distal small bowel.  On March 21, 2002, the patient gave informed consent and return to the operating room where she underwent exploratory laparotomy and adhesiolysis of multiple loops of small bowel which had become adhered to the right adnexal structures, causing obstruction.  Her postoperative course has been benign except that, because of her prolonged ileus and because of her long period since she had last eaten, she was started on TNA which was continued until resolution of her ileus on March 27, 2002.  At that time, she began passing flatus.  The nasogastric tube was removed, and diet progressed through a soft diet.  At the time of discharge, she is tolerating a soft ADA diet.  She is having multiple bowel movements each day.  No nausea or vomiting.  She does have some mild to moderate abdominal distention.  Throughout the course of her hospitalization, her glucose tolerance remained in quite reasonable condition. On March 27, 2002, when her TNA was discontinued, her glucose was at a maximum of 177.  She is being discharged now to be followed up in the office in two weeks with me.  She is advised to return to the internal medicine clinic for further control of her diabetes and diabetes evaluation within one week.  ACTIVITY:  As tolerated.  DIET:  2000 calorie ADA.  DISCHARGE MEDICATIONS: 1. Vicodin 1 to 2 every 4 hours p.r.n. pain. 2. She is advised to resume Glucophage and Glucotrol at the previously    recommended doses.  CONDITION UPON DISCHARGE:  Improved Dictated by:   Candee Furbish. Bubba Camp, M.D. Attending Physician:  Sherolyn Buba DD:  03/30/02 TD:  03/31/02 Job: [id number]

## 2011-05-14 NOTE — Discharge Summary (Signed)
NAME:  [name] [name], [name] [name]               ACCOUNT NO.:  [id number]   MEDICAL RECORD NO.:  [id number]          PATIENT TYPE:  INP   LOCATION:  [id number]                         FACILITY:  [facility]   PHYSICIAN:  Blane Ohara McDiarmid, M.D.DATE OF BIRTH:  [birth year]   DATE OF ADMISSION:  03/15/2007  DATE OF DISCHARGE:  03/17/2007                               DISCHARGE SUMMARY   DISCHARGE DIAGNOSES:  1. Diabetic gastroparesis.  2. Anemia.  3. Dehydration.  4. Hypokalemia.  5. Hypertension.  6. Diabetes mellitus, type 2.  7. Migraine headaches.  8. Tobacco abuse.   CONSULTATIONS:  None.   PROCEDURES:  1. KUB showed constipation without bowel obstruction.  2. EKG showed no significant change from previous EKG; no ST, Q, or T      wave changes; sinus tachycardia.   HOSPITAL COURSE:  1. NAUSEA AND VOMITING:  Similar to previous admissions, due to      diabetic gastroparesis.  The patient is status post a      cholecystectomy.  Her lipase and liver enzymes were normal.  Stool      was heme negative. She had grossly bloody emesis per nursing in the      emergency department due to a Mallory-Weiss tear, beginning only      after she vomited several times.  Peptic ulcer disease is a      possibility, so we placed her on b.i.d. Protonix IV as well as      holding aspirin.  Morphine was used as need for pain.  Reglan was      changed to IV q. 6 h.  Phenergan and Zofran were used as needed for      nausea as well.  She was given 1.5 liters normal saline bolus in      the emergency department, started IV fluid resuscitation with 3      times normal saline for 4 hours, then placed her on maintenance D5      half normal saline with 30 potassium chloride due to her potassium      being 3.2 on admission.  Potassium was 4.6 on discharge.  She was      then made n.p.o. and advanced diet to full liquids on discharge.      She had no emesis during hospitalization.  Her EKG showed sinus      tachycardia without  any ST, T, or Q wave changes.  Her EKG on post      admission day #1 also was unchanged. We changed her to p.o. Vicodin      and Protonix with per rectum Phenergan.   #2.  ANEMIA DUE TO HEMODILUTION AND MALLORY-WEISS TEAR:  Her hemoglobin  was stable at 9.3 on discharge.   #3.  TACHYCARDIA DUE TO DEHYDRATION AND STRESS:  Fluids were repleted as  noted in #1, and patient was normocardic on discharge.   #4.  DEHYDRATION:  Please see #1 above.   #5.  HYPERTENSION:  Blood pressure was significantly elevated to  220s/120s in the emergency department.  The patient states she is only  on lisinopril  at home as she ran out of the combination with  hydrochlorothiazide.  She has a history of nonadherence to treatment in  the past, likely could not keep down medications on day of admission as  well.  She was written for labetalol 20 mg IV given p.r.n. systolic  blood pressure greater than 180.  She was discharged on home  hydrochlorothiazide, lisinopril, and blood pressure ranged 117-137/59-  77.   #6.  DIABETES MELLITUS, TYPE 2:  Her A1c is 7.1.  She was placed on  sensitive sliding scale insulin with q. 4 h. CBGs and transitioned to  half her home dose of Lantus on discharge, 6 units.  She was told to  take her home regimen of 12 units daily the day after.  She is to  restart metformin as at home as well.   #7.  MIGRAINE HEADACHES:  This is common when the patient has bouts of  gastroparesis.  It resolved with IV fluid resuscitation and morphine for  pain. She had no neurologic signs or symptoms on the exam.   #8.  TOBACCO ABUSE:  The patient only smokes 2 cigarettes per day and  states she wants to quit on her own.   DISCHARGE MEDICATIONS:  1. Lantus 6 units tonight, then 12 units nightly starting March 22.  2. Hydrochlorothiazide/lisinopril 12.5/20 daily.  3. Metformin 1000 mg b.i.d.  4. Reglan 10 mg before meals or food and nightly.  5. Vicodin 5/500 one to two tablets q. 6 h. p.r.n.  pain.  The patient      was instructed not to take more than 4000 mg between this and      Tylenol.  6. Tylenol 325 mg 1-2 tablets q. 4 h p.r.n. pain.  7. Phenergan suppositories 25 mg every 6 hours as needed. The patient      was also written a prescription for Phenergan p.o. 25 mg as needed      but not to take both at the same time.  8. Protonix 40 mg b.i.d.  9. Zofran 4 mg q. 6 h p.r.n. nausea.   CONDITION ON DISCHARGE:  Good, stable.   FOLLOWUP:  [name], M.D. [name] on March 27  at 8:30 a.m.   ISSUES FOR FOLLOWUP:  1. GI workup with EGD versus decrease Protonix to daily and restart      aspirin as peptic ulcer disease is less likely than gastroparesis.      Will leave this up to the primary care physician.  2. Check CBCs for hemoglobin which was 9.3 on discharge.     ______________________________  Karlton Lemon, M.D.    ______________________________  Blane Ohara McDiarmid, M.D.    SH/MEDQ  D:  03/19/2007  T:  03/19/2007  Job:  [id number]   cc:   [name], M.D.

## 2011-05-14 NOTE — Consult Note (Signed)
NAME:  [name] [name], [name] [name]                         ACCOUNT NO.:  [id number]   MEDICAL RECORD NO.:  [id number]                   PATIENT TYPE:  REC   LOCATION:  FOOT                                 FACILITY:  [facility]   PHYSICIAN:  Orlando Penner. Sevier, M.D.              DATE OF BIRTH:  Jun 06, 1966   DATE OF CONSULTATION:  08/27/2004  DATE OF DISCHARGE:                                   CONSULTATION   HISTORY:  This 45 year old black female who is previously known to this  clinic is seen today with the recurrence of major calluses underlying the  first and fifth metatarsal head areas bilaterally.   The patient was seen for essentially the same problem 13 months ago and at  that time had paring of the lesions and recommendations were made for better  control of her diabetes as well as cushion (not custom) inserts in her  shoes.   Apparently, since that time the patient has been filing her own calluses  down to the best she is able, but has paid very little attention to the  management of her diabetes and continues to carry hemoglobin A1Cs in the  range of 12 and continues to smoke one-half pack of cigarettes per day  despite admonitions from all corners that she needs to do better with her  diabetes and to give up her smoking.   She has, since the time of her last visit here, been placed on insulin,  currently a combination of Lantus and Novolog, but still has a long way to  go, obviously, on her diabetes control.   With that background history, patient began several weeks ago to notice pain  and malodor related to the callus underlying her first metatarsal head up on  the left.  She had consulted [facility] where they debated some  debridement, but decided against it and placed her on a course of Levaquin.  She was x-rayed at that time in late July and found to have no definite  evidence of osteomyelitis and no apparent deep abscess.   She apparently did improve somewhat after  that but there has been no other  specific treatment of her lesions and she has been taken off work because of  concern about her feet.  She is here today for further evaluation and  advice.   PAST MEDICAL HISTORY:  1.  Hypertension.  2.  Major depression with a suicide attempt.  3.  Known heart murmur.  4.  Anxiety.  5.  Insomnia.  6.  Apparent gastroparesis.   ALLERGIES:  She is said to be allergic to [facility].   REGULAR MEDICATIONS:  1.  Avandamet.  2.  Lantus.  3.  Novolog insulin (exact dose is uncertain).  4.  Lexapro.  5.  Neurontin.  6.  Nexium.  7.  Ramipril.  8.  Reglan.  9.  Trazodone.   PHYSICAL EXAMINATION:  EXTREMITIES:  Examination today is limited to the  distal lower extremities.  Patient's feet are without edema or gross  deformity.  Skin temperatures are in the high normal range bilaterally and  there is a 2-3 degree differential between the left foot and the right.  All  pulses are everywhere palpable.  Monofilament testing shows that she has  protective sensation throughout her feet.   There are hard extremely exophytic calluses underlying the first and fifth  metatarsal heads bilaterally.  There is no evidence at this point of active  cellulitis, abscess, or apparent ulceration.   DISPOSITION:  1.  The patient is again given instruction regarding foot care and general      diabetes care by video with nurse and physician reinforcement.  Although      she thinks her diabetes is doing better, it is pointed out to her that      her A1C of 11.9% in late July is absolutely dismal and unacceptable.  2.  The patient's foot wear evaluated and what she brings in today is a pair      of tennis shoes with their over-the-counter inserts which appear to be      adequate to her needs at this point.  3.  The aforementioned calluses are pared without difficulty.  There is no      evidence of ulceration underlying the calluses on the right foot or that       underlying the fifth metatarsal head on the left.  However, underlying      the callus on the first metatarsal head area on the left is a shallow      ulcer measuring approximately 8 x 7 mm and approximately 1-1.5 mm in      depth.  4.  This ulcer is dressed with application of Neosporin and an Allevyn pad.      The patient is then placed in a Darco platform walker on that left lower      extremity and it is in turn fitted with a donut type foam rubber      application such that this should keep all pressure off the ulcerated      area.  5.  The patient is instructed to continue out of work until seen again here.  6.  Follow-up visit will be here in six days.                                               Orlando Penner. London Pepper, M.D.    RES/MEDQ  D:  08/27/2004  T:  08/28/2004  Job:  [id number]   cc:   Sallyanne Havers, M.D.  [address] [place], [place] [address]  Fax: [phone]

## 2011-05-14 NOTE — Discharge Summary (Signed)
[name] [name], [name] [name]               ACCOUNT NO.:  [id number]   MEDICAL RECORD NO.:  [id number]          PATIENT TYPE:  INP   LOCATION:  [id number]                         FACILITY:  [facility]   PHYSICIAN:  Richardine Service, M.D.    DATE OF BIRTH:  24-Nov-1966   DATE OF ADMISSION:  03/06/2006  DATE OF DISCHARGE:  03/09/2006                                 DISCHARGE SUMMARY   ADDENDUM:  Previous dictation number [phone].   DISCHARGE MEDICATIONS:  1.  NovoLog 70/30, 16 units subcu in the a.m. and 16 subcu with dinner meal.  2.  Lisinopril 10 mg p.o. daily instead of Altace secondary to medication      cost.  3.  Metformin 500 mg p.o. b.i.d. with meals initiated at starting dose      secondary to GI effects.  This may be titrated per her primary care      physician.  This medicine was substituted instead of Avandamet secondary      to cost as well.   The patient was instructed to follow up with Dr. Eliezer Lofts at [facility].  She states that she will call to set up this appointment  at [phone].      Richardine Service, M.D.     MR/MEDQ  D:  03/09/2006  T:  03/10/2006  Job:  [id number]

## 2011-05-14 NOTE — Op Note (Signed)
[place]. [facility]  Patient:    [name] [name], [name] [name] Visit Number: [id number] MRN: [id number]          Service Type: MED Attending Physician:  Fayrene Fearing Dictated by:   Donnita Falls, M.D. Proc. Date: 03/13/02 Admit Date:  05/12/2002 Discharge Date: 05/16/2002                             Operative Report  PROCEDURE:                    Total abdominal hysterectomy, right salpingo-oophorectomy.  PREOPERATIVE DIAGNOSIS:       Uterine fibroids, pelvic pain.  POSTOPERATIVE DIAGNOSIS:      Uterine fibroids, pelvic pain, plus right tubal ovarian abscess and pelvic adhesions.  SURGEON:                      Donnita Falls, M.D.  ASSISTANT:                    Doreatha Massed, M.D.  FINDINGS:                     On entry into the peritoneal cavity, the uterus was approximately the size of a [redacted] week gestation, markedly irregular with multiple intramural and subserosal leiomyomata.  There were marked adhesions especially to the right with a large cystic mass which was bound down to the right side wall.  The mass was multilocular and measured approximately 5 cm in diameter with a second hemorrhagic-type mass attached to the first that measured about 7 cm x 5 cm x 5 cm.  In attempting to free up the mass on the right side, the tubo-ovarian abscess burst and a nonfoul yellowish material extruded from the mass.  DESCRIPTION OF PROCEDURE:     Under satisfactory general anesthesia with the patient in the dorsal supine position, a Foley catheter was placed in the urinary bladder, and the abdomen and vagina were prepped and draped in the usual sterile fashion.  The abdomen was entered through a Pfannenstiel scar from previous cesarean sections situated 3 cm above the symphysis pubis and extending for a total length of 16 cm.  The abdomen was entered by layers.  On entering the peritoneal cavity, the findings were as aforementioned.  By blunt and sharp dissection  the uterus was somewhat freed up, although, bound down to the pelvic wall on the right.  The left adnexa was attended to.  The ovary on the left side appeared normal as did the tube.  A 1 chromic catgut suture was placed around the round ligament on the right side.  The ligament was then divided and the suture cut short.  Anterior leaf of the broad ligament on the right was opened by sharp dissection and the bladder flap formed by sharp dissection and the bladder pushed well away from the anterior surface of the cervix.  A Heaney clamp was placed medial to the ovary on the left side. The tissue medial divided and the lateral pedicle ligated doubly with #1 chromic catgut suture ligatures.  The uterine vessels were then skeletonized, doubly clamped, divided, and doubly ligated with #1 chromic catgut suture ligatures. The tubo-ovarian abscess ruptured as aforementioned and by sharp and blunt dissection, the uterus was gradually freed up on the right side.  The round ligament was ligated with a #1 chromic catgut suture ligature and  the sharp dissection of the pedicles were clamped with Heaney clamps, divided, and ligated with #1 chromic catgut suture ligatures doubly.  Once this was accomplished down to the cervix, the uterine vessels were clamped doubly and divided, ligated with #1 chromic catgut suture ligatures and the cardinal ligaments were serially clamped, divided, and ligated doubly with #1 chromic catgut suture ligatures down to the cervix.  The vagina was entered by sharp dissection.  The cervix measured almost 10 cm in length.  Angle sutures of #1 chromic were placed in each of the lateral vaginal cuff angles and the cuff was run with a baseball running locked suture of 1 chromic catgut.  Attention was now directed to the right adnexa which was plastered against the wall and approach had to be made from the top portion of the peritoneum.  This was gradually freed up by blunt and sharp  dissection and the pedicles ligated with #1 chromic catgut suture ligatures doubly.  The mass was removed and sent for pathological diagnosis.  Areas were observed for bleeding and none were noted. Upper abdominal viscera were explored.  There was some perihepatic adhesions. The liver and gallbladder appeared normal.  The fascia was then closed with a continuous running alternating locked 0 Vicryl from either end of the incision and meeting in the midpoint.  The skin edges were approximated with skin staples.  Dry sterile dressing was applied.  Tape, instrument, sponge, and needle count were reported correct at the end of the procedure.  Total blood loss was approximately 700 cc.  The patient was transferred to the recovery room in satisfactory condition after having tolerated the procedure well. Dictated by:   Donnita Falls, M.D. Attending Physician:  Fayrene Fearing DD:  03/13/02 TD:  03/14/02 Job: [id number]

## 2011-05-14 NOTE — Discharge Summary (Signed)
NAME:  [name] [name], [name] [name]               ACCOUNT NO.:  [id number]   MEDICAL RECORD NO.:  [id number]          PATIENT TYPE:  INP   LOCATION:  5733                         FACILITY:  [facility]   PHYSICIAN:  [name], M.D.DATE OF BIRTH:  05-31-1966   DATE OF ADMISSION:  02/20/2007  DATE OF DISCHARGE:  02/24/2007                               DISCHARGE SUMMARY   PRIMARY CARE PHYSICIAN:  Dr. [name].   CONSULTATIONS:  None.   PROCEDURES/IMAGES:  Abdominal flat plate showed fecal impaction.   DISCHARGE LABORATORY DATA:  Potassium 3.5, creatinine 0.7.  White count  5.5, hemoglobin 9.2, platelets 217.  Ferritin 125, iron 60, TIBC 334,  percent saturation 18.   DISCHARGE DIAGNOSES:  1. Nausea and vomiting, resolved.  2. Diabetic gastroparesis.  3. Type 2 diabetes.  4. Peripheral neuropathy.  5. Hypertension.  6. Tobacco dependence.  7. Depression.   DISCHARGE [name]:  1. Reglan one tab q.i.d.  2. Lisinopril/hydrochlorothiazide one tab daily.  3. Metformin 500 mg, two tab b.i.d.  4. Phenergan 25 mg, one tab q.6h. p.r.n.  5. Lantus 12 units q.h.s.  6. MiraLax 17 grams p.o. for constipation.   HISTORY OF PRESENT ILLNESS:  Please see the dictated H&P for the full  HPI.  In brief, [name] [name] is a 45 year old African/American female who  presents frequently with intractable nausea and vomiting, secondary to  diabetic gastroparesis, who presented to the emergency department after  she had been vomiting for half a day and Reglan and Phenergan did not  relieve her symptoms.  She also presented with severe epigastric pain.   HOSPITAL COURSE:  #1 - INTRACTABLE NAUSEA AND VOMITING WITH BLOODY  EMESIS:  This is likely secondary to gastroparesis with esophagitis.  An  EGD during her last admission a couple of weeks ago showed no  obstruction or ulcers.  She did have some blood-streaked emesis but no  coffee-ground emesis during this admission.  We continued Reglan and  Phenergan and by the second day of her admission her nausea and vomiting  had resolved.  A KUB also did not show any obstruction or ileus.  At  discharge it was recommended that she continue to take her Reglan  regularly.  She misses sometimes, not taking it.  She has a prescription  for her Reglan that should last her for a couple of more months.  We  also talked about controlling her diabetes as the likely source of her  gastroparesis.  She believes at this time that her gastroparesis was  brought about by taking Tramadol for chest pain.  I therefore will  refill her Vicodin, as she states that this is the best pain medicine to  control her epigastric pain.   #2 - DIABETES, POORLY CONTROLLED:  Her hemoglobin A1c was 7.6 in January  2008.  Her Metformin was held on admission due to acute renal failure  which was pre-renal, secondary to dehydration from nausea and vomiting.  Restarted it by the second day of admission.  She tolerated this well.  Her CBGs were well controlled throughout the course of her admission  with her Lantus and Metformin.  Encouraged her to take her maintenance  Metformin regularly, which she states she does comply with now.   #3 - ACUTE RENAL FAILURE:  Her creatinine was slightly elevated on  admission at 1.2, which is similar to what it was on her admission two  weeks ago.  Her baseline is between 0.7 and 0.8.  She returned back to  0.7 by the second day of admission with some aggressive hydration.   #4 - HYPERTENSION:  The patient presented with hypertensive urgency  during this admission, along with the last admission, with blood  pressures systolically ranging from the 180's to 210's/100.  This is  believed to be secondary to the stress of vomiting, along with inability  to take her p.o. blood pressure [name].  We gave her IV labetalol  20 mg t.i.d. until she was able to tolerate p.o., at which point we  restarted her Lisinopril/hydrochlorothiazide.  We  sent her home on the  same home regimen of Lisinopril/hydrochlorothiazide.   #5 - DEPRESSION:  The patient claims that she is very depressed;  however, she is worried about taking any anti-depressants that may  worsen her gastroparesis.  There are some SSIs that may be helpful;  however, [name] [name], even in  generic ones, as a $20. to $30. per month added to her other  [name], is simply not affordable at this time.  She denied any  medication or financial help with her [name] at this time.  She is  willing to talk about it with her primary care doctor.  She feels that  she has a very good relationship with him.   FOLLOWUP:  The patient is to follow up with Dr. [name] at the  [facility] on April 03, 2007, at 1:30 p.m.     ______________________________  Arnette Norris, M.D.    ______________________________  Jamal Collin. Andria Frames, M.D.    TA/MEDQ  D:  02/23/2007  T:  02/23/2007  Job:  [id number]

## 2011-05-14 NOTE — Consult Note (Signed)
NAME:  [name] [name], DEGOOD                         ACCOUNT NO.:  [id number]   MEDICAL RECORD NO.:  [id number]                   PATIENT TYPE:  REC   LOCATION:  FOOT                                 FACILITY:  [facility]   PHYSICIAN:  Orlando Penner. Sevier, M.D.              DATE OF BIRTH:  January 19, 1966   DATE OF CONSULTATION:  08/01/2003  DATE OF DISCHARGE:                                   CONSULTATION   FOOT CENTER CONSULTATION NOTE:   HISTORY:  This 45 year old black female seen at the courtesy of Dr. Lorin Mercy  for assistance with the management of painful callus formation on both feet.   The patient is a long-standing diabetic with the disease first diagnosed in  25.  Although she says she takes her medications she is admittedly not  good on diet and her most recent hemoglobin A1C two months ago was 12.1.  She has had painful calluses on the first and fifth metatarsal heads  bilaterally for some three or four years but has never had these previously  trimmed.  She is here now for our evaluation and advice.   PAST MEDICAL HISTORY:  1. Is not able for a tendency toward depression.  2. Hypertension.  3. Anxiety.  4. Gastroparesis in association with her diabetes.  5. She has had gallbladder removal.  6. Total abdominal hysterectomy.   ALLERGIES:  PERCOCET.   REGULAR MEDICATIONS:  1. Glucotrol XL.  2. Glucophage-XR.  3. Avandia.  4. Ambien.  5. Neurontin.  6. Ramipril.   PHYSICAL EXAMINATION:  Examination today is limited to the distal lower  extremities.  The feet are without gross deformities and are not edematous.  Skin temperatures are normal and symmetrical.  All pulses are easily  palpable and adequate.  Monofilament testing shows the preservation of  protective sensations throughout both feet.   There are substantial areas of callus formation at the first and fifth  metatarsal head areas bilaterally on the plantar aspect of the feet.   DISPOSITION:  1. The patient is given  instruction regarding diabetes by video with nurse     and physician reinforcement.   1. Specifically it is strong recommended to her that she make every effort     to become more acceptant of her diabetes and to get it under better     control to head off future foot problems and other concerns.  She is     recommended [facility] which she says     she will pursue.   1. The aforementioned calluses on the first and fifth metatarsal head areas     bilaterally are sharply pared without incident.   1. The patient is given Bag balm to use on her feet to keep these areas     soft.   1. It is recommended to the patient that she obtain an over-the-counter  padded insert to use in her shoes in addition to whatever insert may     naturally be there to give some added protection against the pressure     that has created these calluses.   1. Repeat visit is to be here in six weeks for further addressing of these     calluses and hopefully for further discussion of preventive measures.                                               Orlando Penner. London Pepper, M.D.    RES/MEDQ  D:  08/01/2003  T:  08/02/2003  Job:  [id number]   cc:   Karmen Bongo, M.D.  [facility] - [place], [place] [address]  Fax: [phone]

## 2011-05-14 NOTE — Discharge Summary (Signed)
NAME:  [name] [name], [name] [name]               ACCOUNT NO.:  [id number]   MEDICAL RECORD NO.:  [id number]          PATIENT TYPE:  INP   LOCATION:  6713                         FACILITY:  [facility]   PHYSICIAN:  [place] A. Walker Kehr, M.D.    DATE OF BIRTH:  1966/04/05   DATE OF ADMISSION:  03/06/2006  DATE OF DISCHARGE:  03/09/2006                                 DISCHARGE SUMMARY   ADMITTING DIAGNOSES:  1.  Nausea, vomiting, nonbilious, nonbloody.  2.  Nonbilious, nonbloody diarrhea.  3.  Diabetes mellitus type 2.  4.  Hypertension.  5.  Diabetic neuropathy.  6.  Tobacco abuse.  7.  Anxiety.  8.  History of diabetic foot ulcer.  9.  History of diabetic gastroparesis undergoing esophagogastroduodenoscopy      in 2005 which showed esophagitis as well as gastroparesis.  10. Cholecystectomy with exploratory laparotomy in 2002.  11. Abdominal hysterectomy secondary to lysis of adhesions in 2003.  12. History of recurrent urinary tract infection.   DISCHARGE DIAGNOSES:  1.  Nausea, vomiting, nonbilious, nonbloody.  2.  Nonbilious, nonbloody diarrhea.  3.  Diabetes mellitus type 2.  4.  Hypertension.  5.  Diabetic neuropathy.  6.  Tobacco abuse.  7.  Anxiety.  8.  History of diabetic foot ulcer.  9.  History of diabetic gastroparesis undergoing esophagogastroduodenoscopy      in 2005 which showed esophagitis as well as gastroparesis.  10. Cholecystectomy with exploratory laparotomy in 2002.  11. Abdominal hysterectomy secondary to lysis of adhesions in 2003.  12. History of recurrent urinary tract infection.  13. Gastroparesis.   CONSULTS:  None.   PROCEDURES:  1.  2-D echocardiogram on March 07, 2006.  2.  Acute abdominal series which was negative for obstruction on March 06, 2006.   HOSPITAL COURSE:  [name] [name] is a 45 year old with past medical history as  outlined above who presents with one day history of nonbilious, nonbloody  nausea and vomiting as well as diarrhea.  She denies any  recent travel and  states that she did not have any fever.  She was transferred.  She also  complained of mid epigastric pain as well as some chest fullness which she  thinks is associated with recurrent vomiting.  For the patient's nausea and  vomiting, given her extensive history, a cardiac origin in a patient with  diabetes was sought.  The patient's initial EKG showed normal sinus rhythm  with no ST changes.  Her cardiac markers showed a troponin I of 0.02 with a  CK-MB of 1.5 and a CK of 475.  Repeat set showed a troponin I of 0.03 with a  CK-MB of 2.2 and a CK of 586 and her last set showed a troponin I of 0.04,  CK-MB of 2.2 as well as a CK of 459.  Therefore, we considered the patient  was ruled out for an acute myocardial infarction.  Given her history as well  with cholecystectomy as well as exploratory laparotomy with cholecystectomy  we were worried about adhesions as well as obstructive process.  Her  acute  abdominal series showed no acute chest disease or abdominal disease.  There  was no findings suggestive of ileus or bowel obstruction.  There was no  recent history of antibiotic use in stool.  Therefore, C. difficile was less  likely on the differential diagnosis.  The patient's initial electrolyte  panel showed a potassium of 2.9.  She was given several runs of KCl.  Her  initial glucose was 346 which improved with hydration.  Patient's white  blood cell count was 10.7 with 78% neutrophils.  Also considering her  history of recurrent urinary tract infections this was looked into as well.  Her urinalysis specific gravity showed 1.024, glucose greater than 1000,  greater than 80 ketones, negative for nitrites and leukocyte esterase.  Her  microscopic revealed 7-10 red blood cells with a few bacteria.  Her liver  function tests were all within normal limits.  After aggressive rehydration  and potassium electrolyte replacement the patient was stable to take  adequate p.o.'s.   She was transitioned over to a clear liquid diet and then  a diabetic diet as tolerated without nausea and vomiting.  We did continue  her on Phenergan 25 as needed as well as Reglan.   DIABETES:  Hemoglobin A1c was checked during this admission and was found to  be 13.6.  Once the patient was taking adequate p.o.'s we put her on her home  regimen of 70/30 16 units in the morning as well as 16 units at night.  The  patient did state that she had been off of her Avandamet for at least a  month.  We titrated this slowly due to the GI symptoms that the patient was  already experiencing.  Therefore, we put her on Avandamet 500/2 mg to take  one tablet p.o. b.i.d. with meals.  Patient's fingerstick blood sugars  throughout this hospitalization ranged from 97 to 225.  This can be titrated  per her outpatient doctor.  We also recommend that a fasting lipid panel be  obtained to further risk stratify the patient.   HYPERTENSION:  The patient's blood pressure initially was 196/99.  She was  given labetalol as needed to keep her systolic blood pressures less than  140.  We transitioned the patient back over to her home regimen of Altace 5  mg p.o. daily.  Her creatinine function upon admission was creatinine of 0.9  and prior to discharge the patient's creatinine was at baseline 0.8.  We  also recommend that the patient have her routine urinalysis checked for  protein as well as microalbumin given her history of diabetes.   ANXIETY/DEPRESSION:  The patient is currently not on any medicines and does  not exhibit signs of either at this time.  We will defer to the primary care  physician to follow up on these issues.   The patient did have a 2/6 systolic ejection murmur heard best at the left  upper sternal border.  Therefore, an echocardiogram was sought, also given  the patient's history of hypertension.  The results of this are pending at this time.  We ask that the primary care physician to  follow up on this to  rule out any valvular disease or cardiomyopathy.   For tobacco abuse we did place the patient on a nicotine patch 14 mg daily.  She did receive a smoking cessation consult throughout this hospitalization  and is not willing to quit smoking at this time.  We ask that the primary  care physician continue to follow up on this issue with the patient.   For prophylaxis she was placed on Protonix 40 mg p.o. daily as well as  compression devices to the lower extremities.  Fluid, electrolytes, and  nutrition.  The patient does have a Port-A-Cath and she was med locked on  her fluids prior to discharge since she was taking adequate p.o. diet.  Her  electrolytes included a potassium of 4.5, a sodium of 138, a glucose of 186,  and creatinine as noted above 0.8 and she was on an ADA/healthy heart diet.   DISCHARGE MEDICATIONS:  1.  Altace 5 mg p.o. daily.  2.  Avandamet 500/2 mg one tablet p.o. b.i.d. with meals.  3.  NovoLog 70/30 16 units subcutaneous in the a.m., 16 units subcutaneous      with dinner meal.  4.  Phenergan 25 mg p.o. q.8h. as needed for severe vomiting as well as      Reglan 10 mg p.o. q.a.c./h.s. to avoid nausea and vomiting.  5.  Flexeril 5 mg tablets every eight hours as needed for muscle spasms.   She was instructed to follow up with her primary care physician, Dr. Eliezer Lofts at [facility] and given the number [phone] to  schedule this appointment.  She was also told to return to the emergency  department or clinic for signs of dehydration, inability to take liquid or  food by mouth, fever, or any other concerns.      Richardine Service, M.D.    ______________________________  Arty Baumgartner. Walker Kehr, M.D.    MR/MEDQ  D:  03/08/2006  T:  03/10/2006  Job:  [id number]

## 2011-05-16 NOTE — Discharge Summary (Signed)
NAME:  [name] [name], [name] [name]               ACCOUNT NO.:  [id number]  MEDICAL RECORD NO.:  [id number]           PATIENT TYPE:  I  LOCATION:  [id number]                         FACILITY:  [place]  PHYSICIAN:  Alva A. Walker Kehr, M.D.    DATE OF BIRTH:  02/11/1966  DATE OF ADMISSION:  04/08/2011 DATE OF DISCHARGE:  04/12/2011                              DISCHARGE SUMMARY   PRIMARY CARE PROVIDER:  Mariana Arn, MD at [facility].  DISCHARGE DIAGNOSES: 1. Nausea and vomiting secondary to reflux esophagitis versus cyclic     vomiting. 2. Hypertension. 3. Anemia. 4. Acute renal insufficiency. 5. Epigastric pain.  DISCHARGE MEDICATIONS: 1. Clonidine 0.2 mg/24-hour patch one patch transdermally q.7. 2. Vicodin 5/325 one tablet by mouth every 4 hours as needed for     severe pain. 3. Lisinopril 5 mg 1 tablet by mouth daily. 4. Reglan 5 mg 1 tablet by mouth 30 minutes prior to meal. 5. Prilosec 20 mg 1 capsule by mouth twice daily. 6. Aspirin enteric coated 81 mg 1 tablet by mouth daily. 7. Ativan 1 mg 1 tablet by mouth every 6 hours as needed for nausea. 8. Coreg 25 mg 1 tablet by mouth twice daily. 9. Coenzyme Q10 one tablet by mouth daily. 10.Flexeril 10 mg 1 tablet by mouth 3 times a day as needed for muscle     relaxant. 11.L-Carnitine 1 tablet by mouth daily. 12.Lantus 10 units subcu daily at bedtime. 13.Mirtazapine 30 mg 1 tablet by mouth daily at bedtime. 14.NovoLog 6 units subcutaneously 3 times a day. 15.Phenergan 25 mg 1 tablet by mouth every 4 hours as needed for     nausea and vomiting. 16.Promethazine rectal suppository 25 mg one suppository rectally     every 6 hours as needed for nausea and vomiting. 17.Sucralfate 1 g/10 mL suspension 10 mL by mouth before meals and at     bed time.  CONSULTS:  Gastroenterology.  PROCEDURES:  April 12, 2011, gastric emptying:  Moderate delay in gastric emptying.  LABORATORY DATA AT DISCHARGE:  BMET, sodium 135, potassium  4.34, chloride 107, CO2 24, BUN 6, creatinine 1.52, and glucose 151.  CBC, white count 4.2, hemoglobin 9.6, hematocrit 27.9 and platelets 198,000. A1c 9.5.  TSH 1.257.  Cardiac enzymes negative x3.  Urine drug screen positive for opiates and THC.  Lipase 34.  HOSPITAL COURSE:  This is a 48-year female with a history of cyclic vomiting who presents to the hospital frequently with nausea, vomiting and mid epigastric pain. 1. Nausea and vomiting.  Vomiting lasted one day and the patient's     diet was advanced as tolerated.  On hospital day #2, the patient     was tolerating a clear liquid diet, and on hospital day #3, the     patient was eating solid food.  The patient was given Zofran,     Phenergan and Ativan as needed for any nausea or vomiting.  Per GI     consult, they saw that the recurrent nausea and vomiting was likely     secondary to reflux esophagitis and not cyclic vomiting.  GI  performed an EGD which showed a small hiatal hernia and gastritis.     They also performed a gastric emptying study with results as above.     The patient will be discharged home with Zofran and Phenergan by     mouth and per rectum. 2. Hypertension.  The patient's blood pressure was elevated on     admission, likely secondary to pain and nausea and vomiting.  The     patient was started on her home medications once she was able to     tolerate p.o. because the patient's blood pressure was still     elevated at systolic [id number] diastolic A999333.  The patient was     started on a clonidine patch and her blood pressure did begin to     trend down on the day of discharge.  The patient did mention having     difficulty of affording the clonidine patch.  The patient's PCP to     follow for further medication adjustment. 3. Anemia.  The patient's hemoglobin and hematocrit were stable     throughout the hospital course.  The patient has been worked up for     anemia during her past hospitalizations.  The  patient will benefit     from anemia panel as an outpatient. 4. Acute on chronic renal insufficiency.  It appears the patient's     baseline creatinine is 1.4-1.5.  The patient's creatinine was     elevated on admission, but was back to baseline on the day of     discharge. 5. Mid epigastric pain.  Due to the patient's complaints of mid     epigastric pain, cardiac enzymes were ordered and ACS was ruled     out.  Initially, the patient was getting Dilaudid every 4-6 hours     as needed for her pain.  Prior to discharge, the patient's Dilaudid     was stopped and I restarted her home medications of Vicodin 5/325     every 4 hours as needed.  The patient's pain should resolved when     she is able to eat and no longer nauseous.  There was a component     of musculoskeletal pain as the patient mentioned when she checked     up the pain in her chest and in the mid epigastric area become     worse.  The patient may benefit from a Lidoderm patch. 6. Hypertension.  Lisinopril 5 mg was started due to patient's history     of CHF and diabetes.  DISCHARGE INSTRUCTIONS:  DIET:  Carbohydrate-modified heart-healthy.  ACTIVITY:  No restrictions.  FOLLOWUP APPOINTMENTS:  Dr. Mariana Arn, at [facility] on April 14, 2011, at 11 o'clock a.m.  SPECIAL INSTRUCTIONS:  Took Reglan 30 minutes before eating to help with your stomach emptying and stop any activity that causes chest pain, shortness of breath, dizziness, sweating or excessive weakness.  DISCHARGE CONDITION:  The patient was discharged home in stable medical condition.    ______________________________ [name], MD   ______________________________ Arty Baumgartner Walker Kehr, M.D.    ID/MEDQ  D:  04/13/2011  T:  04/14/2011  Job:  [id number]  cc:   Mariana Arn, MD  Electronically Signed by [name] MD on 04/28/2011 06:40:06 PM Electronically Signed by Candelaria Celeste M.D. on 05/16/2011 03:58:23 PM

## 2011-05-27 NOTE — H&P (Signed)
NAME:  [name] [name], [name] [name]               ACCOUNT NO.:  [id number]  MEDICAL RECORD NO.:  [id number]           PATIENT TYPE:  I  LOCATION:  13-Apr-2104                         FACILITY:  [place]  PHYSICIAN:  Talbert Cage, M.D.DATE OF BIRTH:  Apr 23, 1966  DATE OF ADMISSION:  2011/04/14 DATE OF DISCHARGE:                             HISTORY & PHYSICAL   PRIMARY CARE PROVIDER:  Mariana Arn, MD, [facility] practice Center.  CHIEF COMPLAINT:  Epigastric abdominal pain and nausea, vomiting.  HISTORY OF PRESENT ILLNESS:  [name] [name] is a 45 year old female presenting with nausea, vomiting and epigastric abdominal pain that started at 6:00 a.m. this morning.  The patient describes epigastric pain as sharp and burning.  Nothing seems to alleviate symptoms.  Her home Ativan that usually helps with these symptoms, did not decrease her nausea.  The patient also used Phenergan at home without relief.  The patient states that her vomit appeared to have blood in it.  She states that this episode of abdominal pain and vomiting is similar to past episodes and that she normally does notice blood in her vomitus.  The patient is very well know to our service.  She had 2 recent admissions and left for the same complaints.  No fever, no diarrhea.  No dizziness and syncope.  No urinary complaints.  No blood in her stool.  PAST MEDICAL HISTORY: 1. Diabetic neuropathy. 2. Diabetes type 2. 3. Anemia. 4. Depression/anxiety. 5. Tobacco dependence. 6. Migraine. 7. Hypertension. 8. GERD 9. Persistent vomiting. 10.Pancreatitis. 11.Chronic back pain. 12.Insomnia. 13.Abdominal pain.  PAST SURGICAL HISTORY: 1. Cholecystectomy. 2. Total abdominal hysterectomy and right salpingo-oophorectomy     secondary to tubo-ovarian abscess. 3. Exploratory lap with lysis of adhesions secondary to small bowel     obstruction.  SOCIAL HISTORY:  The patient is single, lives alone.  She has a 10th grade education and has worked  in [employer] the most recent past.  She has a history of marijuana as well as tobacco abuse, approximately 1 pack q.3 days.  Denies alcohol.  FAMILY HISTORY:  The patient has diabetes in the first-degree relatives. Mother died of MI in Apr 14, 2003.  Siblings with hypertension.  No family history of cyclic vomiting or migraine or colon cancer.  ALLERGIES:  ERYTHROMYCIN and PERCOCET.  MEDICATION LIST: 1. Norvasc 10 mg daily. 2. Aspirin 81 mg daily. 3. Coreg 25 mg b.i.d. with meals. 4. Flexeril 5 mg t.i.d. p.r.n. 5. Colace 100 mg b.i.d. p.r.n. 6. Vicodin 1-2 tablets by mouth q.6 h p.r.n. 7. Insulin 6 units t.i.d. before meals. 8. Lantus 22 units daily. 9. Ativan 1 mg q.6 h p.r.n. 10.Glucophage 1000 mg tablet, unknown frequency. 11.Remeron 30 mg p.o. at bedtime. 12.Prilosec 20 mg p.o. daily. 13.MiraLax p.r.n. for constipation. 14.Phenergan 25 mg q.6 h as needed. 15.Phenergan suppositories 25 mg q.4 h p.o.  The patient does not know current medication list.  This med list was obtained from last office note.  The patient has a med list from her discharge summary that is different.  We have asked pharmacy to med rec.  REVIEW OF SYSTEMS:  Per HPI.  PHYSICAL EXAMINATION:  VITAL  SIGNS:  Blood pressure 195/82, pulse 95, respirations 16, pulse ox 100% in room air.  The patient is afebrile. GENERAL APPEARANCE:  Appears comfortable, is cooperative with exam. HEENT:  Pupils equal, round, reactive to light and accommodation. Extraocular movements intact.  Mucous membranes moist. CARDIOVASCULAR:  A 1/6 systolic ejection murmur.  Regular rate and rhythm.  No edema. LUNGS:  Clear to auscultation bilateral.  No wheezes or rales. Breathing unlabored. ABDOMEN:  No CVA tenderness.  Bowel sounds present.  Tenderness present in epigastric area.  No rebound or guarding.  No masses. EXTREMITIES:  No lower extremity edema or skin breakdown. SKIN:  No rashes. NEUROLOGY:  Alert and x3.  Cranial nerves II  through XII grossly intact. No gross deficits in motor or sensory findings.  LABORATORIES AND IMAGING:  Sodium 137, potassium 3.8, chloride 98, bicarb 24, BUN 25, creatinine 1.73, glucose 259.  White blood cells 9.1, hemoglobin 13.6, hematocrit 38.3, MCV 91.4, platelets 248.  Abdomen, 2- view showed no active cardiopulmonary disease and one view in no acute or specific abdominal findings.  ASSESSMENT AND PLAN: 1. Vomiting.  Presentation appears consistent with recurrence of     cyclic vomiting.  The patient is unable to identify trigger.  She     denies current marijuana use and no other medication exposures.  In     the ER, she was given Zofran, Dilaudid, Ativan with minimal     clinical improvement and the patient is still actively vomiting.     Therefore, we will try Phenergan as it seems to help for the most     in the past.  The patient also states that Phenergan compatible     with Ativan and Dilaudid has been but does need to treat her cyclic     vomiting in the past.  We did not have Reglan IV available at this     time, but we will treat the patient's cyclic vomiting with     Dilaudid, Zofran and Phenergan p.r.n.  Dilaudid also seems to help     relieve epigastric abdominal pain.  Epigastric abdominal pain may     be secondary to reflux since the patient does describe as burning,     unable to give p.o. medications to relieve gastric reflux, but we     will give Protonix IV x1 to see if this helps with symptoms.  The     patient also takes Ativan at home for nausea, so we will continue     this IV, but we will switch to p.o. when the patient able tolerate     p.o.'s.  We will keep the patient n.p.o. and advance diet as     tolerated, likely no sooner than tomorrow morning.  The patient's     is not clinically dehydrated at this time, but we will start     maintenance IV fluids. 2. Diabetes mellitus.  Hypoglycemic, likely due be noncompliance,     still with insulin doses as  well as stress response from cyclic     vomiting.  The patient has an anion gap of 15, which may be     contributing to her nausea, vomiting.  We will start on partial     dose of Lantus 5 units daily for now and start sliding scale     insulin.  We will plan to restart home dose once the patient is     tolerating oral intake. 3. Hypertension.  The patient cannot take  home antihypertensive due to     nausea, vomiting.  We will treat pain with Dilaudid and vomiting     with Phenergan and Ativan.  In past hospitalizations, hypertension     resolved with control of symptoms.  Also noted a discrepancy     between office med list and last discharge summaries.  I am not     sure at this point what the patient is taking at home to control     her blood pressure.  We could use metoprolol 5 mg IV q.8 h needed     for systolic blood pressures greater than 180 if blood pressure not     controlled after symptoms improved. 4. Acute renal failure.  The patient's creatinine is mildly elevated     at 1.73.  Last known value from hospital admission was 1.6, maybe     slight increase from baseline.  We will monitor with IV fluid     hydration. 5. Fluids, electrolytes, nutrition.  The patient will be n.p.o. for     cyclic vomiting or advance as tolerated.  We will administer normal     saline 100 mL per hour.  Electrolytes currently normal.  We will     follow up with BMET in the morning. 6. Prophylaxis.  Heparin 5000 units t.i.d. and PPI daily. 7. Disposition.  Pending clinical improvement.     Dorthey Sawyer, MD   ______________________________ Talbert Cage, M.D.    DC/MEDQ  D:  03/22/2011  T:  03/23/2011  Job:  [id number]  Electronically Signed by Dorthey Sawyer  on 04/13/2011 02:27:37 PM Electronically Signed by Talbert Cage M.D. on 05/27/2011 09:29:39 AM

## 2011-05-31 ENCOUNTER — Emergency Department (HOSPITAL_COMMUNITY): Payer: Medicare Other

## 2011-05-31 ENCOUNTER — Emergency Department (HOSPITAL_COMMUNITY)
Admission: EM | Admit: 2011-05-31 | Discharge: 2011-05-31 | Disposition: A | Discharge status: Home / Self Care | Payer: Medicare Other | Attending: Emergency Medicine | Admitting: Emergency Medicine

## 2011-05-31 DIAGNOSIS — R109 Unspecified abdominal pain: Secondary | ICD-10-CM | POA: Insufficient documentation

## 2011-05-31 DIAGNOSIS — R10819 Abdominal tenderness, unspecified site: Secondary | ICD-10-CM | POA: Insufficient documentation

## 2011-05-31 DIAGNOSIS — R112 Nausea with vomiting, unspecified: Secondary | ICD-10-CM | POA: Insufficient documentation

## 2011-05-31 DIAGNOSIS — Z79899 Other long term (current) drug therapy: Secondary | ICD-10-CM | POA: Insufficient documentation

## 2011-05-31 DIAGNOSIS — K5900 Constipation, unspecified: Secondary | ICD-10-CM | POA: Insufficient documentation

## 2011-05-31 LAB — CBC
Hemoglobin: 12.6 g/dL (ref 12.0–15.0)
MCH: 31.8 pg (ref 26.0–34.0)
Platelets: 285 10*3/uL (ref 150–400)
RBC: 3.96 MIL/uL (ref 3.87–5.11)
WBC: 8.1 10*3/uL (ref 4.0–10.5)

## 2011-05-31 LAB — URINALYSIS, ROUTINE W REFLEX MICROSCOPIC
Glucose, UA: NEGATIVE mg/dL
Ketones, ur: 15 mg/dL — AB
Protein, ur: 100 mg/dL — AB
pH: 7 (ref 5.0–8.0)

## 2011-05-31 LAB — DIFFERENTIAL
Basophils Absolute: 0 10*3/uL (ref 0.0–0.1)
Basophils Relative: 0 % (ref 0–1)
Monocytes Relative: 8 % (ref 3–12)
Neutro Abs: 4.6 10*3/uL (ref 1.7–7.7)
Neutrophils Relative %: 57 % (ref 43–77)

## 2011-05-31 LAB — COMPREHENSIVE METABOLIC PANEL
ALT: 13 U/L (ref 0–35)
AST: 20 U/L (ref 0–37)
Albumin: 4.2 g/dL (ref 3.5–5.2)
Alkaline Phosphatase: 88 U/L (ref 39–117)
Chloride: 103 mEq/L (ref 96–112)
GFR calc Af Amer: 48 mL/min — ABNORMAL LOW (ref >60)
Potassium: 4 mEq/L (ref 3.5–5.1)
Sodium: 140 mEq/L (ref 135–145)
Total Bilirubin: 0.3 mg/dL (ref 0.3–1.2)

## 2011-05-31 LAB — URINE MICROSCOPIC-ADD ON

## 2011-06-03 ENCOUNTER — Encounter: Payer: Self-pay | Admitting: Family Medicine

## 2011-06-03 ENCOUNTER — Emergency Department (HOSPITAL_COMMUNITY)
Admission: EM | Admit: 2011-06-03 | Discharge: 2011-06-03 | Disposition: A | Discharge status: Other Acute Inpatient Hospital | Payer: Medicare Other | Source: Home / Self Care | Attending: Emergency Medicine | Admitting: Emergency Medicine

## 2011-06-03 ENCOUNTER — Inpatient Hospital Stay (HOSPITAL_COMMUNITY)
Admission: AD | Admit: 2011-06-03 | Discharge: 2011-06-08 | DRG: 074 | Disposition: A | Discharge status: Home / Self Care | Payer: Medicare Other | Source: Other Acute Inpatient Hospital | Attending: Family Medicine | Admitting: Family Medicine

## 2011-06-03 DIAGNOSIS — Z7982 Long term (current) use of aspirin: Secondary | ICD-10-CM

## 2011-06-03 DIAGNOSIS — K5900 Constipation, unspecified: Secondary | ICD-10-CM | POA: Diagnosis not present

## 2011-06-03 DIAGNOSIS — Z79899 Other long term (current) drug therapy: Secondary | ICD-10-CM

## 2011-06-03 DIAGNOSIS — I10 Essential (primary) hypertension: Secondary | ICD-10-CM | POA: Diagnosis present

## 2011-06-03 DIAGNOSIS — M545 Low back pain, unspecified: Secondary | ICD-10-CM | POA: Diagnosis present

## 2011-06-03 DIAGNOSIS — E1149 Type 2 diabetes mellitus with other diabetic neurological complication: Principal | ICD-10-CM | POA: Diagnosis present

## 2011-06-03 DIAGNOSIS — K3184 Gastroparesis: Secondary | ICD-10-CM | POA: Diagnosis present

## 2011-06-03 DIAGNOSIS — Z794 Long term (current) use of insulin: Secondary | ICD-10-CM

## 2011-06-03 DIAGNOSIS — D638 Anemia in other chronic diseases classified elsewhere: Secondary | ICD-10-CM | POA: Diagnosis present

## 2011-06-03 DIAGNOSIS — F17200 Nicotine dependence, unspecified, uncomplicated: Secondary | ICD-10-CM | POA: Diagnosis present

## 2011-06-03 LAB — COMPREHENSIVE METABOLIC PANEL
ALT: 12 U/L (ref 0–35)
AST: 17 U/L (ref 0–37)
Albumin: 3.9 g/dL (ref 3.5–5.2)
Alkaline Phosphatase: 78 U/L (ref 39–117)
BUN: 14 mg/dL (ref 6–23)
Chloride: 103 mEq/L (ref 96–112)
Potassium: 3.7 mEq/L (ref 3.5–5.1)
Sodium: 136 mEq/L (ref 135–145)
Total Bilirubin: 0.3 mg/dL (ref 0.3–1.2)
Total Protein: 7.4 g/dL (ref 6.0–8.3)

## 2011-06-03 LAB — DIFFERENTIAL
Basophils Absolute: 0 10*3/uL (ref 0.0–0.1)
Eosinophils Absolute: 0.1 10*3/uL (ref 0.0–0.7)
Lymphocytes Relative: 33 % (ref 12–46)
Lymphs Abs: 2.9 10*3/uL (ref 0.7–4.0)
Neutrophils Relative %: 58 % (ref 43–77)

## 2011-06-03 LAB — GLUCOSE, CAPILLARY: Glucose-Capillary: 135 mg/dL — ABNORMAL HIGH (ref 70–99)

## 2011-06-03 LAB — CBC
MCV: 90.4 fL (ref 78.0–100.0)
Platelets: 235 10*3/uL (ref 150–400)
RBC: 3.85 MIL/uL — ABNORMAL LOW (ref 3.87–5.11)
RDW: 13.3 % (ref 11.5–15.5)
WBC: 8.7 10*3/uL (ref 4.0–10.5)

## 2011-06-03 NOTE — Progress Notes (Signed)
[facility] Admission History and Physical  Patient name: [name] [name] Medical record number: [id number] Date of birth: 09-05-1966 Age: 45 y.o. Gender: female  Primary Care Provider: Mariana Arn, MD  Chief Complaint: nausea/vomiting History of Present Illness: [name] [name] is a 45 y.o. year old female well known to the [facility] [place] team with a history of cyclical vomiting who presents today with 1-day history of nausea and vomiting. Started acutely this morning when she woke up. Does not know what could have triggered this. Was eating fine up until this morning. Vomitus may occasionally be blood-tinged but is non-bilious. Also with sharp burning epigastric pain. Unable to tolerate any PO medications at this time. Presented to the [facility]. Received a dose of Dilaudid, Phenergan, and Zofran there. May have helped some initially but pain and nausea/vomiting severe at this time. Unclear what triggers these events. Denies any changes to diet, environment, or recent stressors. Did consider going back to work yesterday.   Past Medical History:  1. Diabetes mellitus type 2.   2. Diabetic neuropathy.   3. Unspecified anemia.   4. Depression, anxiety.   5. Tobacco dependence.   6. History of migraines.   7. Hypertension.   8. GERD.   9. History of multiple admissions for cyclic vomiting.   10.History of pancreatitis.   11.Chronic back pain.   12.Insomnia.   Past Surgical History:  1. Cholecystectomy.   2. Total abdominal hysterectomy, right salpingo-oophorectomy secondary       to ovarian abscess.   3. Exploratory laparotomy, lysis of adhesions secondary to small-bowel       obstruction.   4. C-sections.  Social History:  The patient is single.  She lives alone.  She has a tenth grade education and worked for [employer] in the most recent past,   but currently is not working. Considered going back to work yesterday but now this current episode began. History of  marijuana. Last used 3 weeks ago. Helps abdominal pain. Smokes one pack every 3 days.  Denies any alcohol use; last used 3 years ago.   Family History: The patient has diabetes in first few relatives. Mother died of MI in Apr 20, 2003.  Siblings with hypertension.  No other family   members with history of cyclic vomiting or migraines.   Allergies:  ERYTHROMYCIN which causes nausea.   Medications:  1. Vicodin 5/325 one tablet by mouth every 4 hours as needed for       severe pain.   2. Reglan 5 mg 1 tablet by mouth 30 minutes prior to meal.   3. Prilosec 20 mg 1 capsule by mouth twice daily.   4. Aspirin enteric coated 81 mg 1 tablet by mouth daily.   6. Ativan 1 mg 1 tablet by mouth every 6 hours as needed for nausea.   7. Coreg 25 mg 1 tablet by mouth twice daily.   8. Flexeril 10 mg 1 tablet by mouth 3 times a day as needed for muscle       relaxant.   9. Lantus 10 units subcu daily at bedtime.   10. Mirtazapine 30 mg 1 tablet by mouth daily at bedtime.   11. NovoLog 6 units subcutaneously 3 times a day.   12. Phenergan 25 mg 1 tablet by mouth every 4 hours as needed for       nausea and vomiting.   16. Promethazine rectal suppository 25 mg one suppository rectally       every 6 hours  as needed for nausea and vomiting.   17. Sucralfate 1 g/10 mL suspension 10 mL by mouth before meals and at       bed time.   Review Of Systems: Per HPI with the following additions: no fevers but chills likely 2/2 current episode. Last bowel movement yesterday. Takes Miralax occasionally but cannot take too much because brings on cyclical vomiting. Denies dysuria. Denies chest pain or difficulty breathing.   Physical Exam: Pulse: 98  Blood Pressure: 160/100 RR: 20   O2: 98 RA Temp: 97.8  General: very uncomfortable, agitated, squirming in bed HEENT: somewhat dry MM CV: tachycardic, regular rhythm, no MRG Pulm: CTAB, no increased WOB Abd: epigastric TTP, no guarding, hypoactive bowel sounds, well-healed  vertical lower mid-line incision Ext: no pretibial/pedal edema, 1+ pedal pulses Skin: 3-4 second capillary refill Neuro: no focal deficits, fully alert and oriented and appropriate to questions Psych: agitated, tearful  Labs and Imaging: Lab Results  Component Value Date/Time   NA 136 06/03/2011  1:07 PM   K 3.7 06/03/2011  1:07 PM   CL 103 06/03/2011  1:07 PM   CO2 23 06/03/2011  1:07 PM   BUN 14 06/03/2011  1:07 PM   CREATININE 1.42* 06/03/2011  1:07 PM   GLUCOSE 145* 06/03/2011  1:07 PM  Bilirubin, Total                         0.3               0.3-1.2          mg/dL  Alkaline Phosphatase                     78                39-117           U/L  SGOT (AST)                               17                0-37             U/L  SGPT (ALT)                               12                0-35             U/L  Total  Protein                           7.4               6.0-8.3          g/dL  Albumin-Blood                            3.9               3.5-5.2          g/dL  Calcium                                  9.9  8.4-10.5         mg/dL  Lab Results  Component Value Date   WBC 8.7 06/03/2011   HGB 11.7* 06/03/2011   HCT 34.8* 06/03/2011   MCV 90.4 06/03/2011   PLT 235 06/03/2011    Lipase                                   21                11-59            U/L  Abdominal XR (05/31/2011):    1.  No acute cardiopulmonary findings.   2.  Large amount of stool throughout the colon suggesting   constipation.  April 12, 2011, gastric emptying:  Moderate delay in gastric emptying.   Assessment and Plan: [name] is a 45 year old female with a history of cyclical vomiting, [id number], and HTN presenting with recurrent cyclical vomiting with inability to tolerate PO and with epigastric pain consistent with her previous episodes. 1. Cyclic vomiting as above.  Given Dilaudid, Zofran, and Phenergan in the ED with minimal improvement. Will check vomitus for blood. Will start the usual regimen for  her, including full-dose Dilaudid PCA to control pain and with scheduled Zofran 8mg  q6 IV, Phenergan 25mg  q6 IV, and Reglan 5mg  bid. Will transition to PO morphine and PO medications as soon as patient can tolerate. Will start IV Protonix 40mg  based on her history of bloody vomitus in the past. Patient reports marijuana improves abdominal pain, but she has a history of marijuana use triggering previous episodes. Will check UDS. Unclear etiology of this most recent episode of vomiting.  No clear signs of infection based on normal WBC and afebrile. Will not consult GI at this time since this is consistent with her previous episodes, GI was consulted during her 03/2011 episode, and gastric emptying study at that time showed delayed gastric emptying.  2. T2DM, insulin dependent. No gap today and Glc 145. Since she is NPO, will hold her home Lantus and Novlog. Will put on moderate SSI. Will give IVF @ 225. Will consider re-adding lisinopril 5mg , which patient says her PCP intended on re-adding but had not gotten around to doing this. 3. HTN. BP elevated right now likely secondary to discomfort. Will continue home metoprolol. Will consider adding lisinopril. Will give hydralazine prn. She had been on a clonidine patch in the past, but this was discontinued.  4. Renal. Baseline Cr seems to be about 1.4-1.6, so she seems to be at baseline. Will hydrate and will follow-up in the AM.  5. Tobacco abuse. Will make Nicotine patch 7mg  available prn.  6. Chronic back pain. No complaints currently. Dilaudid PCA for this. On Vicodin at home. Will re-start Vicodin once her epigastric pain is improved. Will continue home flexeril prn.  7. Psych. Will continue home Mirtazepine.  8. FEN/GI. NPO. NS @ 225 mL/hr.  9. PPx. Heparin SQ. Protonix.  10. Disposition. To home pending clinical improvement.      Samuel Germany. [place] [phone]

## 2011-06-04 DIAGNOSIS — K3184 Gastroparesis: Secondary | ICD-10-CM

## 2011-06-04 DIAGNOSIS — R1115 Cyclical vomiting syndrome unrelated to migraine: Secondary | ICD-10-CM

## 2011-06-04 DIAGNOSIS — E118 Type 2 diabetes mellitus with unspecified complications: Secondary | ICD-10-CM

## 2011-06-04 LAB — RAPID URINE DRUG SCREEN, HOSP PERFORMED
Amphetamines: NOT DETECTED
Barbiturates: NOT DETECTED
Benzodiazepines: NOT DETECTED
Cocaine: NOT DETECTED
Opiates: NOT DETECTED

## 2011-06-04 LAB — CBC
MCH: 31 pg (ref 26.0–34.0)
MCHC: 33.9 g/dL (ref 30.0–36.0)
MCV: 91.4 fL (ref 78.0–100.0)
Platelets: 231 10*3/uL (ref 150–400)
RBC: 3.61 MIL/uL — ABNORMAL LOW (ref 3.87–5.11)
RDW: 13.4 % (ref 11.5–15.5)

## 2011-06-04 LAB — GLUCOSE, CAPILLARY
Glucose-Capillary: 115 mg/dL — ABNORMAL HIGH (ref 70–99)
Glucose-Capillary: 126 mg/dL — ABNORMAL HIGH (ref 70–99)
Glucose-Capillary: 86 mg/dL (ref 70–99)

## 2011-06-04 LAB — BASIC METABOLIC PANEL
BUN: 13 mg/dL (ref 6–23)
Calcium: 8.6 mg/dL (ref 8.4–10.5)
Chloride: 106 mEq/L (ref 96–112)
Creatinine, Ser: 1.22 mg/dL — ABNORMAL HIGH (ref 0.4–1.2)
GFR calc Af Amer: 57 mL/min — ABNORMAL LOW (ref >60)

## 2011-06-04 LAB — HEMOGLOBIN A1C: Mean Plasma Glucose: 200 mg/dL — ABNORMAL HIGH (ref <117)

## 2011-06-04 NOTE — Progress Notes (Addendum)
I have seen and examined this patient with Dr. Verdie Drown and agree with her findings.   In brief: This is a 45 y/o patient well known to our practice who comes in frequently with cyclic vomiting. She developed this problem 9 years ago after having an abdominal surgery that caused an ileus. Most of the time her vomiting episodes come during times of stress or excitement (i.e. Christmas, birthday, Thanksgiving). She just told her boss she thinks she can start going back to work now and then started vomiting the morning of 06-03-11. She has been eating normally, she continues to have constipation which she uses Miralax to control but says it's been harder lately to control. She is not able to keep down fluids or meds right now. Pt went to [facility] and was treated with Dilauded, Zofran, Phenergan and IVF's.   Please see excellent PGY1 note for PMH, PSH, FH, SH, Meds and Allergies, ROS and vital signs (hypertensive).   PE:  Gen: Pt is sitting on the edge of the bed rocking and moaning and holding her stomach.  HEENT: /AT, EOMI, No external ear deformities, no external nasal deformities. Mucus membranes are moist in the mouth. CV: RRR, no murmur Pulm: CTAB, no wheezing Abd: appears to be distended but pt is also overweight, tenderness in the epigastric region. No lower abdominal tenderness. Vertical scar from umbilicus to pubis bone.  Ext: No edema, normal skin color Psych: Pt is very anxious, tearful, agitated and hyperkinetic.     Labs: Cr 1.42, Hg 11.7, Abd x-ray shows stool   A/P: 45 y/o F with h/o cyclic vomiting here for cyclic vomiting. 1: Vomiting: Pt has a h/o of this and has responded well to Phenergan and Zofran interchanged, Dilauded PCA, and Reglan. Plan to restart this and transition as she is able to take PO's. Pt has had a gastric emptying study which showed slowed gastric emptying.  2: DM2: Pt will be put on a SSI since she is not currently eating and will hold her regular doses of Lantus  and Novolog.  3: Constipation: Pt is constipated even though she says she had a BM yesterday she still feels constipated. Plan to give milk and molasses. 4: HTN: Pt has elevated BP's. Will monitor. I suspect they will improve when we get her pain under control  5: All other issues please see excellent PGY1 note. Dispo will likely be when the patient is able to tolerate PO fluids and some foods.

## 2011-06-05 LAB — BASIC METABOLIC PANEL
CO2: 24 mEq/L (ref 19–32)
Chloride: 108 mEq/L (ref 96–112)
Creatinine, Ser: 1.22 mg/dL — ABNORMAL HIGH (ref 0.4–1.2)
Glucose, Bld: 63 mg/dL — ABNORMAL LOW (ref 70–99)
Sodium: 139 mEq/L (ref 135–145)

## 2011-06-05 LAB — GLUCOSE, CAPILLARY
Glucose-Capillary: 107 mg/dL — ABNORMAL HIGH (ref 70–99)
Glucose-Capillary: 114 mg/dL — ABNORMAL HIGH (ref 70–99)

## 2011-06-06 LAB — BASIC METABOLIC PANEL
BUN: 7 mg/dL (ref 6–23)
CO2: 25 mEq/L (ref 19–32)
Chloride: 109 mEq/L (ref 96–112)
GFR calc non Af Amer: 46 mL/min — ABNORMAL LOW (ref >60)
Glucose, Bld: 86 mg/dL (ref 70–99)
Potassium: 3.8 mEq/L (ref 3.5–5.1)
Sodium: 137 mEq/L (ref 135–145)

## 2011-06-06 LAB — GLUCOSE, CAPILLARY
Glucose-Capillary: 93 mg/dL (ref 70–99)
Glucose-Capillary: 140 mg/dL — ABNORMAL HIGH (ref 70–99)
Glucose-Capillary: 100 mg/dL — ABNORMAL HIGH (ref 70–99)

## 2011-06-07 ENCOUNTER — Inpatient Hospital Stay (HOSPITAL_COMMUNITY): Payer: Medicare Other

## 2011-06-07 LAB — BASIC METABOLIC PANEL
BUN: 10 mg/dL (ref 6–23)
GFR calc Af Amer: 49 mL/min — ABNORMAL LOW (ref >60)
GFR calc non Af Amer: 40 mL/min — ABNORMAL LOW (ref >60)
Potassium: 4 mEq/L (ref 3.5–5.1)

## 2011-06-07 LAB — CBC
HCT: 26.3 % — ABNORMAL LOW (ref 36.0–46.0)
MCHC: 35 g/dL (ref 30.0–36.0)
Platelets: 165 10*3/uL (ref 150–400)
RDW: 12.9 % (ref 11.5–15.5)
WBC: 4.4 10*3/uL (ref 4.0–10.5)

## 2011-06-07 LAB — GLUCOSE, CAPILLARY
Glucose-Capillary: 165 mg/dL — ABNORMAL HIGH (ref 70–99)
Glucose-Capillary: 228 mg/dL — ABNORMAL HIGH (ref 70–99)

## 2011-06-08 LAB — BASIC METABOLIC PANEL
BUN: 12 mg/dL (ref 6–23)
Calcium: 8.6 mg/dL (ref 8.4–10.5)
Chloride: 104 mEq/L (ref 96–112)
Creatinine, Ser: 1.51 mg/dL — ABNORMAL HIGH (ref 0.4–1.2)
GFR calc Af Amer: 45 mL/min — ABNORMAL LOW (ref >60)
GFR calc non Af Amer: 37 mL/min — ABNORMAL LOW (ref >60)
Sodium: 136 mEq/L (ref 135–145)

## 2011-06-08 LAB — CBC
HCT: 26.1 % — ABNORMAL LOW (ref 36.0–46.0)
Hemoglobin: 9.2 g/dL — ABNORMAL LOW (ref 12.0–15.0)
RBC: 2.91 MIL/uL — ABNORMAL LOW (ref 3.87–5.11)

## 2011-06-08 LAB — GLUCOSE, CAPILLARY: Glucose-Capillary: 169 mg/dL — ABNORMAL HIGH (ref 70–99)

## 2011-06-17 ENCOUNTER — Ambulatory Visit (INDEPENDENT_AMBULATORY_CARE_PROVIDER_SITE_OTHER): Payer: Medicare Other | Admitting: Family Medicine

## 2011-06-17 ENCOUNTER — Encounter: Payer: Self-pay | Admitting: Family Medicine

## 2011-06-17 DIAGNOSIS — R1115 Cyclical vomiting syndrome unrelated to migraine: Secondary | ICD-10-CM

## 2011-06-17 DIAGNOSIS — E1165 Type 2 diabetes mellitus with hyperglycemia: Secondary | ICD-10-CM

## 2011-06-17 NOTE — Discharge Summary (Signed)
NAME:  [name] [name], [name] [name]               ACCOUNT NO.:  [id number]  MEDICAL RECORD NO.:  [id number]  LOCATION:  [facility]LED                         FACILITY:  [facility]  PHYSICIAN:  Blane Ohara McDiarmid, M.D.DATE OF BIRTH:  Apr 01, 1966  DATE OF ADMISSION:  06/03/2011 DATE OF DISCHARGE:  06/03/2011                              DISCHARGE SUMMARY   PRIMARY CARE PROVIDER:  Mariana Arn, MD  REASON FOR HOSPITALIZATION: 1. Nausea, vomiting and severe epigastric abdominal pain. 2. Dehydration.  DISCHARGE DIAGNOSES: 1. Cyclical vomiting flare. 2. Constipation. 3. Type 2 diabetes insulin dependent. 4. Hypertension. 5. Tobacco and marijuana abuse. 6. Chronic low back pain. 7. Chronic renal insufficiency.  MEDICATION:  New medications: 1. Insulin 7 units subcutaneously at bedtime. 2. Lidocaine 5% patch topically daily. 3. Metoclopramide 5 mg p.o. 30 minutes t.i.d. q.a.c. 4. Nicotine 7 mg per 24-hour patch transdermally p.r.n. 5. Oxycodone 5 mg p.o. q. 4 h. P.r.n. 6. MiraLax 17 grams p.o. daily. 7. Phenergan 25 mg p.o. q. 6 hours scheduled for the next 1-2 days,     then p.r.n.  Continue home medications: 1. Aspirin 81 mg p.o. daily. 2. Ativan 1 mg p.o. q. 6 h. P.r.n. 3. Benadryl over-the-counter p.o. daily p.r.n. 4. Coreg 25 mg p.o. b.i.d. 5. Flexeril 10 mg p.o. t.i.d. p.r.n. muscle spasms. 6. Vicodin 5/325 1-2 tablets p.o. q. 4 h. P.r.n. 7. Lisinopril 5 mg p.o. daily. 8. Mirtazapine 30 mg p.o. at bedtime. 9. Phenergan 25 mg p.o. q. 4 h. P.r.n. 10.Prilosec 20 mg p.o. b.i.d. 11.Promethazine 25 mg suppository rectally q. 6 h. P.r.n. 12.Sucralfate 1 g per 10 mL suspension 10 mL p.o. q.a.c. and at     bedtime.  Discontinue medications: 1. Lantus 22 units subcutaneously daily. 2. NovoLog 6 units subcutaneously t.i.d. daily before meals.  CONSULTS:  None.  PROCEDURES: 1. Abdominal x-ray on May 31, 2011, showed no acute cardiopulmonary     findings and large amount of stool throughout the colon  suggesting     constipation. 2. CT of the L-spine without contrast showed normal spine with no     evidence of degenerative disk disease, spinal stenosis, or joint     arthritis.  BRIEF HOSPITAL COURSE:  This is a 45 year old female with a history of cyclical vomiting with frequent admissions presenting with a cyclical vomiting flare. 1. Cyclical vomiting flare.  Unclear what might have caused this     flare.  The patient says refusing marijuana several days ago.     Sometimes the marijuana helped her symptoms, but sometimes they     exacerbate her symptoms.  The patient was admitted and started on     her usual in hospital regimen with Dilaudid PCA and scheduled     Ativan, Reglan, and Phenergan.  Flare was stabilized and symptoms     improved with this regimen.  Started on clears on hospital day #2     and diet advanced.  Home sucralfate re-added once tolerating p.o.     No more episodes of vomiting after admission.  Tube feeds     discontinued on hospital day 5, introduction to oxycodone scheduled     and p.r.n.  On hospital day #6,  nausea controlled, tolerating diet,     and pain controlled with oxycodone. 2. Constipation.  Abdominal x-ray a few days previous to admission     showed significant stool burden.  Responded well to milk and     molasses enema x2.  The patient given teaching on how to do this in     home since she found it helpful. 3. Type 2 diabetes insulin dependent.  Home insulin held secondary to     decreased p.o. intake on admission.  The patient is on sliding     scale insulin.  The patient discharged with only part of her home     insulin regimen since her sugars were 70s to 220s on Lantus 5     units.  We will defer to PCP for likely need to titrate up her     insulin after discharge.  Continued on home lisinopril. 4. Hypertension.  Controlled on home Coreg and lisinopril. 5. Tobacco and marijuana abuse.  Given nicotine patch as needed and     discharged with  a prescription for nicotine patches. 6. Chronic low back pain.  CT of the L-spine not concerning for spinal     stenosis.  Pain is likely musculoskeletal secondary to sleeping on     the hospital bed.  Advised ambulation, heating pads p.r.n. 7. Chronic renal insufficiency.  Baseline creatinine seems to be     around 1.3-1.6.  Creatinine on admission within these limits.     Creatinine on the day of discharge was 1.51.  DISCHARGE INSTRUCTIONS:  The patient was asked to follow up with Dr. Sherilyn Cooter on June 17, 2011, at 10:30 a.m.  FOLLOWUP ISSUES:  PCP may consider increasing the patient's current insulin regimen as needed.    ______________________________ Karen Kays, MD   ______________________________ Blane Ohara McDiarmid, M.D.    AO/MEDQ  D:  06/11/2011  T:  06/12/2011  Job:  [id number]  cc:   Mariana Arn, MD  Electronically Signed by Karen Kays MD on 06/15/2011 04:25:35 PM Electronically Signed by Lissa Morales M.D. on 06/17/2011 03:41:56 PM

## 2011-06-17 NOTE — H&P (Signed)
NAME:  [name], [name] NO.:  [id number]  MEDICAL RECORD NO.:  [id number]  LOCATION:  WLED                         FACILITY:  [facility]  PHYSICIAN:  Blane Ohara Yareliz Thorstenson, M.D.DATE OF BIRTH:  1966/09/25  DATE OF ADMISSION:  06/03/2011 DATE OF DISCHARGE:  06/03/2011                             HISTORY & PHYSICAL   PRIMARY CARE PROVIDER:  Ashok Pall, MD, [place]  CHIEF COMPLAINT:  Nausea/vomiting.  HISTORY OF PRESENT ILLNESS:  [name] [name] is a 45 year old female well- known to the [facility] with a history of cyclical vomiting who presents today with a 1-day history of nausea and vomiting, started acutely this morning when she woke up.  Does not know what could have triggered this.  Was eating fine up until this morning.  Vomitus may occasionally be blood tinged but it is nonbilious.  Also with sharp burning epigastric pain.  Unable to tolerate any p.o. medications at this time.  Presented to the [facility] emergency department. Received a dose of Dilaudid, Phenergan, and Zofran there.  They have helped some initially with the pain and nausea/vomiting, severe at this time.  Unclear what triggered this event.  Denies any changes to diet environment or recent stressors.  They considered going back to work yesterday.  PAST MEDICAL HISTORY: 1. Type 2 diabetes with neuropathy. 2. Unspecified anemia. 3. Depression, anxiety. 4. Tobacco abuse. 5. History of migraines. 6. Hypertension. 7. GERD. 8. History of multiple admissions for cyclical vomiting. 9. History of pancreatitis. 10.Chronic back pain. 11.Insomnia.  PAST SURGICAL HISTORY: 1. Cholecystectomy. 2. Total abdominal hysterectomy, right salpingo-oophorectomy secondary     to ovarian abscess. 3. Exploratory laparotomy, lysis of adhesions secondary to small bowel     obstruction. 4. C-sections.  SOCIAL HISTORY:  The patient is single.  Lives alone.   She has a tenth grade education and worked for [employer] as the most recent task, but currently is not working.  Consider going back to work yesterday but now this current episode began.  History of marijuana, last use 3 weeks ago. Helps abdominal pain.  Smokes one pack every 3 days.  Denies any alcohol use, last use 3 years ago.  FAMILY HISTORY:  The patient has diabetes in first few relatives. Mother died of MI in Apr 21, 2003.  Siblings with hypertension.  No other family members with history of cyclical vomiting or migraines.  ALLERGIES:  ERYTHROMYCIN which causes nausea.  MEDICATIONS: 1. Vicodin 5/325 p.o. q.4 h. p.r.n. severe pain. 2. Reglan 5 mg p.o. 30 minutes prior to meals. 3. Prilosec 20 mg p.o. b.i.d. 4. Aspirin 81 mg p.o. daily. 5. Ativan 1 mg p.o. q.6 h. p.r.n. nausea. 6. Coreg 25 mg p.o. b.i.d. 7. Flexeril 10 mg p.o. t.i.d. p.r.n. muscle spasms. 8. Lantus 10 units subcutaneously at bedtime. 9. Mirtazapine 30 mg p.o. at bedtime. 10.NovoLog 6 units subcu t.i.d. before meals. 11.Phenergan 25 mg p.o. q.4 h. p.r.n. nausea and vomiting. 12.Promethazine rectal suppository 25 mg rectally q.6 h. p.r.n. nausea     and vomiting. 13.Sucralfate 1 gram every 10 mL suspension, 10 mL p.o. before meals     and at bedtime.  REVIEW  OF SYSTEMS:  Per HPI with the following additions:  No fevers but chills, likely secondary to recurrent episode.  Last bowel movement yesterday.  Denies diarrhea.  Takes MiraLax occasionally but cannot take too much because it brings on cyclical vomiting.  Denies dysuria. Denies chest pain or difficulty breathing.  PHYSICAL EXAMINATION:  VITAL SIGNS:  Pulse 98, blood pressure 160/100, respiratory rate 20, O2 sats 98% on room air, and temperature 97.8. GENERAL:  Very uncomfortable, agitated, squirming in bed. HEENT:  Somewhat dry mucous membranes. CARDIOVASCULAR:  Tachycardic, regular rhythm, no murmurs, rubs, or gallops. PULMONARY:  Clear to auscultation  bilaterally with no increased work of breathing. ABDOMEN:  Hypoactive bowel sounds, epigastric tenderness to palpation, no guarding, well-healed critical lower midline incision. EXTREMITIES:  No pretibial or pedal edema, 1+ pedal pulses. SKIN:  3-4 seconds capillary refill. NEUROLOGIC:  No focal deficits, fully alert and oriented and appropriate to questions. PSYCHIATRIC:  Agitated, tearful.  LABORATORY DATA AND IMAGING:  Sodium 136, potassium 3.7, chloride 103, CO2 23, creatinine 1.42, and glucose 145.  T-bili 0.3, alk phos 78, AST 17, ALT 12, T-protein 7.4, albumin 3.9, and calcium 9.9.  White blood count 8.7, hemoglobin 11.7, and platelets 235.  Lipase 21.  Abdominal x- ray on May 31, 2011 showed no acute findings and large amount of stool throughout the colon suggesting constipation.  Gastric emptying study on April 12, 2011 showed moderate delay in gastric emptying.  ASSESSMENT AND PLAN:  [name] is a 45 year old female with a history of cyclical vomiting, type 2 diabetes, and hypertension presenting with recurrent cyclical vomiting with inability to tolerate p.o. and with epigastric pain consistent with her previous episodes. 1. Cyclical vomiting, as above.  Given Dilaudid, Zofran, and Phenergan     in the ED with minimal improvement.  We will check vomitus for     blood.  We will start the usual regimen for her vomiting including     full-dose Dilaudid PCA to control pain and we will schedule Zofran     8 mg q.6 IV, Phenergan 25 mg q.6 IV, and Reglan 5 mg t.i.d.  We     will transition to p.o. morphine and p.o. medications as soon as     the patient can tolerate.  We will start IV Protonix 40 mg based on     a history of bloody vomitus in the past.  The patient reports     marijuana improves the abdominal pain but she has a history of     marijuana use triggering previous episodes.  We will check UDS.     Unclear etiology of this most recent episode of vomiting.  No clear      signs of infection based on normal white blood count and afebrile.     We will consult GI at this time since this is consistent with her     previous episodes, GI was consulted during April 2012 episode, and     epigastric emptying study at that time showed moderately delayed     gastric emptying. 2. Type 2 diabetes, insulin dependent.  New lab study include glucose     145.  Since she is n.p.o., we will hold her home Lantus and     NovoLog.  We will put on moderate sliding scale insulin.  We will     give IV fluids at 225 mL/hour.  We will reconsider adding     lisinopril 5 mg since the patient says her PCP intended  on re-     adding but has not gone around to doing this when the patient is     tolerating p.o. medications. 3. Hypertension.  Blood pressure elevated right now, likely secondary     to distress.  We will continue home metoprolol.  We will consider     re-adding lisinopril when tolerating p.o.  We will give     hydralazine.  She has been on a clonidine patch in the past though     this was discontinued. 4. Renal.  Baseline creatinine seems to be about 1.4-1.6 where she     seems to be at baseline.  We will hydrated and we will follow up in     the a.m.. 5. Tobacco abuse.  We will make nicotine patch 7 mg PD available     p.r.n. 6. Chronic back pain.  No complaints currently.  Dilaudid PCA for     this.  On Vicodin at home.  We will restart Vicodin when her     epigastric pain is improved.  We will continue home Flexeril. 7. Psych.  We will continue home mirtazapine. 8. Fluids, electrolytes, nutrition, and gastrointestinal.  N.p.o.     Normal saline at 225 mL/hour. 9. Prophylaxis.  Heparin subcu.  Protonix.  DISPOSITION:  To home pending clinical improvement.    ______________________________ Karen Kays, MD   ______________________________ Blane Ohara Samiah Ricklefs, M.D.    AO/MEDQ  D:  06/03/2011  T:  06/04/2011  Job:  [id number]  Electronically Signed by Karen Kays MD on 06/15/2011 04:25:54 PM Electronically Signed by Lissa Morales M.D. on 06/17/2011 03:41:59 PM

## 2011-06-18 NOTE — Assessment & Plan Note (Signed)
Improved. Continue Lantus 22units, Novolog 6units TID (OK to titrate down on both during episodes of cyclic emesis) Follow up in three months.  A1c at next visit.

## 2011-06-18 NOTE — Progress Notes (Signed)
  Subjective:    Patient ID: [name] [name], female    DOB: December 25, 1966, 45 y.o.   MRN: [id number]  HPI  1) Persistent vomiting: Hospitalized 06/03/11  with abdominal pain, nausea / emesis cyclic vomiting per her usual episodes of cyclic vomiting - Symptoms were felt to be a manifestation of diabetic gastroparesis and esophagitis as opposed to cyclic emesis. Gastric emptying study during last hospitalization demonstrated moderate delay in gastric emptying, though previous study was negative; EGD demonstrated small hiatal hernia and gastritis.  She responded well to IV fluids, Phenergan, Dilaudid and Ativan. She continues to smoke marijuana, though this has been identified as a positive trigger for her symptoms.  Patient has requested consultation by [facility] (has been seen by [facility] in the past) - still awaiting approval.   2) DM2: On Lantus 22 units daily and Novolog 6 units with meals TID. Reports occasional hypoglycemia to 60's (with episodes of emesis). A1C during recent hospitalization was 8.6 - A1C in March 2012 was 11.3. She denies polyuria, polydipsia, vision change.   Pertinent past history reviewed.   Review of Systems As per HPI     Objective:   Physical Exam Constitutional: She is oriented to person, place, and time. She appears well-developed and well-nourished. No distress.  HENT:  Mouth/Throat: Oropharynx is clear and moist.  Neck: No JVD present.  Cardiovascular: Normal rate, regular rhythm, normal heart sounds and intact distal pulses.   Pulmonary/Chest: Effort normal and breath sounds normal.  Abdominal: Soft. Bowel sounds are normal. She exhibits no distension and no mass. There is tenderness. There is no rebound and no guarding.       Mild diffuse tenderness  Neurological: She is alert and oriented to person, place, and time.  Skin: Skin is warm and dry.

## 2011-06-18 NOTE — Assessment & Plan Note (Signed)
Appears to be largely secondary to diabetic gastroparesis and GERD. Continue treatment as below. Awaiting acceptance to [facility] for management, as patient unable to make it out to [facility] for re-evaluation. Advised to avoid triggers. Continue home regimen. Reviewed red flags that would prompt return to care.

## 2011-06-21 ENCOUNTER — Telehealth: Payer: Self-pay | Admitting: Family Medicine

## 2011-06-21 NOTE — Telephone Encounter (Signed)
Pt asking RN to fax [facility] a copy of all her updated meds, says [facility] sent them the wrg info and now they have the wrg doses of certain meds.

## 2011-06-21 NOTE — Telephone Encounter (Signed)
To MD to be sure that med list is up to date to his knowledge. [name] [name], [name]

## 2011-06-22 NOTE — Telephone Encounter (Signed)
Spoke with pt, she will call and get the fax number from [facility] and will call us back to relay number.  Will fax when we have that info [employer]

## 2011-06-22 NOTE — Telephone Encounter (Signed)
Med list appears to be up to date. Please fax. Thanks!

## 2011-06-22 NOTE — Telephone Encounter (Signed)
Pt states that the fax number is [phone].    States that she only got 10 phenergan with this new company.  Would like to get a 30 day supply.  And would also like to get the novolog pen but received the vials instead.  Advised pt that according to our records we she should be getting the vials and not the pens, but would send to MD to see if he wants to change it. Amna Welker, [name]

## 2011-06-24 ENCOUNTER — Encounter: Payer: Self-pay | Admitting: Family Medicine

## 2011-06-24 ENCOUNTER — Inpatient Hospital Stay (HOSPITAL_COMMUNITY)
Admission: EM | Admit: 2011-06-24 | Discharge: 2011-06-27 | DRG: 392 | Disposition: A | Discharge status: Home / Self Care | Payer: Medicare Other | Attending: Family Medicine | Admitting: Family Medicine

## 2011-06-24 DIAGNOSIS — E1149 Type 2 diabetes mellitus with other diabetic neurological complication: Secondary | ICD-10-CM | POA: Diagnosis present

## 2011-06-24 DIAGNOSIS — F3289 Other specified depressive episodes: Secondary | ICD-10-CM | POA: Diagnosis present

## 2011-06-24 DIAGNOSIS — I10 Essential (primary) hypertension: Secondary | ICD-10-CM | POA: Diagnosis present

## 2011-06-24 DIAGNOSIS — F411 Generalized anxiety disorder: Secondary | ICD-10-CM | POA: Diagnosis present

## 2011-06-24 DIAGNOSIS — F329 Major depressive disorder, single episode, unspecified: Secondary | ICD-10-CM | POA: Diagnosis present

## 2011-06-24 DIAGNOSIS — Z794 Long term (current) use of insulin: Secondary | ICD-10-CM

## 2011-06-24 DIAGNOSIS — E1142 Type 2 diabetes mellitus with diabetic polyneuropathy: Secondary | ICD-10-CM | POA: Diagnosis present

## 2011-06-24 DIAGNOSIS — K219 Gastro-esophageal reflux disease without esophagitis: Secondary | ICD-10-CM | POA: Diagnosis present

## 2011-06-24 DIAGNOSIS — N179 Acute kidney failure, unspecified: Secondary | ICD-10-CM | POA: Diagnosis present

## 2011-06-24 DIAGNOSIS — F17200 Nicotine dependence, unspecified, uncomplicated: Secondary | ICD-10-CM | POA: Diagnosis present

## 2011-06-24 DIAGNOSIS — R1115 Cyclical vomiting syndrome unrelated to migraine: Principal | ICD-10-CM | POA: Diagnosis present

## 2011-06-24 DIAGNOSIS — G4700 Insomnia, unspecified: Secondary | ICD-10-CM | POA: Diagnosis present

## 2011-06-24 DIAGNOSIS — Z7982 Long term (current) use of aspirin: Secondary | ICD-10-CM

## 2011-06-24 LAB — DIFFERENTIAL
Basophils Relative: 0 % (ref 0–1)
Eosinophils Absolute: 0.1 10*3/uL (ref 0.0–0.7)
Eosinophils Relative: 1 % (ref 0–5)
Neutrophils Relative %: 58 % (ref 43–77)

## 2011-06-24 LAB — URINALYSIS, ROUTINE W REFLEX MICROSCOPIC
Protein, ur: 100 mg/dL — AB
Urobilinogen, UA: 0.2 mg/dL (ref 0.0–1.0)

## 2011-06-24 LAB — URINE MICROSCOPIC-ADD ON

## 2011-06-24 LAB — POCT I-STAT, CHEM 8
Calcium, Ion: 1.22 mmol/L (ref 1.12–1.32)
Glucose, Bld: 328 mg/dL — ABNORMAL HIGH (ref 70–99)
HCT: 39 % (ref 36.0–46.0)
Hemoglobin: 13.3 g/dL (ref 12.0–15.0)
TCO2: 23 mmol/L (ref 0–100)

## 2011-06-24 LAB — CBC
MCV: 88.8 fL (ref 78.0–100.0)
Platelets: 305 10*3/uL (ref 150–400)
RBC: 3.93 MIL/uL (ref 3.87–5.11)
RDW: 13.3 % (ref 11.5–15.5)
WBC: 7.1 10*3/uL (ref 4.0–10.5)

## 2011-06-24 LAB — HEPATIC FUNCTION PANEL
ALT: 9 U/L (ref 0–35)
AST: 14 U/L (ref 0–37)
Bilirubin, Direct: 0.1 mg/dL (ref 0.0–0.3)
Indirect Bilirubin: 0.1 mg/dL — ABNORMAL LOW (ref 0.3–0.9)
Total Protein: 7.7 g/dL (ref 6.0–8.3)

## 2011-06-24 LAB — LACTIC ACID, PLASMA: Lactic Acid, Venous: 1.1 mmol/L (ref 0.5–2.2)

## 2011-06-24 LAB — GLUCOSE, CAPILLARY: Glucose-Capillary: 284 mg/dL — ABNORMAL HIGH (ref 70–99)

## 2011-06-24 LAB — POCT PREGNANCY, URINE: Preg Test, Ur: NEGATIVE

## 2011-06-24 MED ORDER — PROMETHAZINE HCL 25 MG PO TABS: 25.0000 mg | ORAL_TABLET | ORAL | Status: DC

## 2011-06-24 MED ORDER — INSULIN ASPART 100 UNIT/ML ~~LOC~~ SOLN: 6.0000 [IU] | Freq: Three times a day (TID) | SUBCUTANEOUS | Status: DC

## 2011-06-24 NOTE — H&P (Addendum)
[facility] Admission History and Physical  Patient name: [name] [name] Medical record number: [id number] Date of birth: November 13, 1966 Age: 45 y.o. Gender: female  Primary Care Provider: Mariana Arn, MD  Chief Complaint: Vomiting and abdominal pain. History of Present Illness: [name] [name] is a 45 y.o. year old female presenting with vomiting, and abdominal pain starting at 5 am this morning. This feels like her typical episode. She tried oral phenergan and Vicodin however she could not keep these medications down. She then tried an IM injection of phenergan which also did not work. She then presented to the ED for further management. She notes that her vomit may be slightly blood tinged. She noted central abdominal pain that does not radiate. Normal BMs. No fever chills, chest pain, dyspnea fatigue or sweats. Has been taking her medications normally. Also notes that her last MJ use was 1 week ago. She is trying to quit but so far has not used any outside help that has been previously organized.     Patient Active Problem List  Diagnoses  . NEUROPATHY, DIABETIC  . DIABETES MELLITUS, II, COMPLICATIONS  . UNSPECIFIED ANEMIA  . DEPRESSION, MAJOR, RECURRENT  . ANXIETY  . TOBACCO DEPENDENCE  . MIGRAINE, UNSPEC., W/O INTRACTABLE MIGRAINE  . HYPERTENSION, BENIGN SYSTEMIC  . GERD  . PERSISTENT VOMITING  . PANCREATITIS  . UNSPECIFIED VAGINITIS AND VULVOVAGINITIS  . VAGINAL PRURITUS  . CALLUSES, FEET, BILATERAL  . BACK PAIN, CHRONIC  . INSOMNIA NOS  . ABDOMINAL PAIN  . HIGH RISK PATIENT  . MUSCLE SPASM, TRAPEZIUS   Past Medical History: See medical problem list.   Past Surgical History:  1. Cholecystectomy.   2. Total abdominal hysterectomy, right salpingo-oophorectomy secondary       to ovarian abscess.   3. Exploratory laparotomy, lysis of adhesions secondary to small bowel       obstruction.   4. C-sections  Social History:  SOCIAL HISTORY:  The  patient is single.  Lives alone.  She has a tenth   grade education and worked for [employer] as the most recent task, but   currently is not working.  Consider going back to work yesterday but now   this current episode began.  History of marijuana, last use 3 weeks ago.   Helps abdominal pain.  Smokes one pack every 3 days.  Denies any alcohol   use, last use 3 years ago.  Family History: The patient has diabetes in first few relatives.   Mother died of MI in 03/31/2003.  Siblings with hypertension.  No other family   members with history of cyclical vomiting or migraines.  Allergies: Allergies  Allergen Reactions  . Erythromycin Nausea And Vomiting  . Oxycodone-Acetaminophen Itching    Current Outpatient Prescriptions  Medication Sig Dispense Refill  . amLODipine (NORVASC) 10 MG tablet Take 10 mg by mouth daily.        Marland Kitchen aspirin 81 MG tablet Take 81 mg by mouth daily.        . carvedilol (COREG) 25 MG tablet Take 25 mg by mouth 2 (two) times daily with meals.        . cyclobenzaprine (FLEXERIL) 5 MG tablet Take 2 tablets (10 mg total) by mouth 3 (three) times daily as needed.  21 tablet  4  . docusate sodium (COLACE) 100 MG capsule Take 100 mg by mouth 2 (two) times daily as needed.        Marland Kitchen EPINEPHrine (EPIPEN) 0.3 MG/0.3ML DEVI  One dose Im as needed for severe anaphylaxis.       Marland Kitchen HYDROcodone-acetaminophen (VICODIN) 5-500 MG per tablet Take 1-2 tablets by mouth every 6 (six) hours.  30 tablet  2  . insulin aspart (NOVOLOG) 100 UNIT/ML injection Inject 6 Units into the skin 3 (three) times daily before meals. Dispense as Pen  3 mL  6  . insulin glargine (LANTUS) 100 UNIT/ML injection Inject 30 Units into the skin every morning. Take 22 Units each morning. Increase by 1 Unit a day for each fasting blood glucose over 110.      Marland Kitchen LORazepam (ATIVAN) 1 MG tablet Take 1 mg by mouth every 6 (six) hours as needed. For nausea.       . metFORMIN (GLUCOPHAGE) 1000 MG tablet       . mirtazapine  (REMERON) 30 MG tablet Take 30 mg by mouth at bedtime.        Marland Kitchen omeprazole (PRILOSEC) 20 MG capsule Take 20 mg by mouth daily.        . polyethylene glycol (MIRALAX) powder Mix packet with water once daily as needed for constipation.       . promethazine (PHENERGAN) 25 MG suppository Place 25 mg rectally every 6 (six) hours as needed.        . promethazine (PHENERGAN) 25 MG tablet Take 1 tablet (25 mg total) by mouth every 4 (four) hours. At first sign of nausea.   120 tablet  3  . PROMETHAZINE HCL IM Inject 25 mg (66mL) IM every 6 hours as needed.       . sucralfate (CARAFATE) 1 G tablet Take 1 g by mouth 4 (four) times daily -  with meals and at bedtime.        Marland Kitchen DISCONTD: insulin aspart (NOVOLOG) 100 UNIT/ML injection Inject 6 Units into the skin 3 (three) times daily before meals.        Marland Kitchen DISCONTD: promethazine (PHENERGAN) 25 MG tablet Take 25 mg by mouth every 4 (four) hours. At first sign of nausea.        Marland Kitchen DISCONTD: promethazine (PHENERGAN) 25 MG tablet Take 1 tablet (25 mg total) by mouth every 4 (four) hours. At first sign of nausea.   120 tablet  3   Review Of Systems: Per HPI  Otherwise 12 point review of systems was performed and was unremarkable.  Physical Exam: Pulse: 90-100  Blood Pressure: 186-221/87-93 RR: 16      O2: 100 on 2L [place] Temp: Not done  General: alert and cooperative HEENT: PERRLA, extra ocular movement intact, oropharynx clear, no lesions and neck supple with midline trachea Heart: S1, S2 normal, no murmur, rub or gallop, regular rate and rhythm Lungs: clear to auscultation, no wheezes or rales and unlabored breathing Abdomen: NABS, SOFT, no masses. Mildly tender. No rebound or guarding.  Extremities: extremities normal, atraumatic, no cyanosis or edema Skin:no rashes, no ecchymoses Neurology: normal without focal findings, mental status, speech normal, alert and oriented x3, PERLA and reflexes normal and symmetric  Labs and Imaging: Lab Results  Component  Value Date/Time   NA 141 06/24/2011 10:28 AM   K 4.2 06/24/2011 10:28 AM   CL 107 06/24/2011 10:28 AM   CO2 27 06/08/2011  3:55 AM   BUN 32* 06/24/2011 10:28 AM   CREATININE 1.80* 06/24/2011 10:28 AM   GLUCOSE 328* 06/24/2011 10:28 AM   Lab Results  Component Value Date   WBC 7.1 06/24/2011   HGB 13.3 06/24/2011   HCT  39.0 06/24/2011   MCV 88.8 06/24/2011   PLT 305 06/24/2011   Lab Results  Component Value Date   ALT 9 06/24/2011   AST 14 06/24/2011   ALKPHOS 80 06/24/2011   BILITOT 0.2* 06/24/2011   Lab Results  Component Value Date   LIPASE 84* 06/24/2011     Lab Results  Component Value Date   HGBA1C 8.6* 06/04/2011     Assessment and Plan: [name] [name] is a 45 y.o. year old female presenting with Vomiting and abdominal pain.  1. Vomiting: This is typical for her execerbations of her vomiting. Perhaps it is triggered by mariajuana use. She does note however some blood tinged vomit.  Her CMP, and other labs, as well as her exam and history do not indicate a more dangerous etiology. However I will check a lactate to ensure not gut ischemia.  Will treat symptoms with usual formula. Will check for gastritis with gastro occults and hemoccult. Using protonix IV.  Will use dilaudid PCA, Scheduled phenergan, zofran, ativan and reglan. Will allow a clear diet and follow along.  If worsening will broaden differential.  2.Diabetes: A1c was 8.6 earlier this month. Will not repeat that lab. Will provide 1/2 of her normal lantus dose @ 10 per day as well as moderate SSI.  Will follow BS every 4 hours.  3. HTN: BP elevated. Will give home coreg based on last hospitalization. Will use IV hydralazine as needed. Will f/u blood pressure medications.  4. Renal: Baseline creating is 1.4 or so. May be in mild acute on chronic renal failure. This is likely due to pre-renal dehydration. Will give IVF at 157ml/hr and follow creatine in the morning.  5. Psych. Will restart home medications when tolerating PO  better.  6. FEN/GI: As above. Will also use protonix. 7. Prophylaxis: Heparin and protonix 8. Disposition: To home when tolerating PO.   I interviewed and examined this patient and discussed the treatment plan with Dr Georgina Snell. Montague A. Walker Kehr, [place]

## 2011-06-25 LAB — COMPREHENSIVE METABOLIC PANEL
AST: 13 U/L (ref 0–37)
Albumin: 3.5 g/dL (ref 3.5–5.2)
Alkaline Phosphatase: 69 U/L (ref 39–117)
Chloride: 105 mEq/L (ref 96–112)
Potassium: 3.4 mEq/L — ABNORMAL LOW (ref 3.5–5.1)
Total Bilirubin: 0.2 mg/dL — ABNORMAL LOW (ref 0.3–1.2)

## 2011-06-25 LAB — CBC
HCT: 31.4 % — ABNORMAL LOW (ref 36.0–46.0)
Platelets: 256 10*3/uL (ref 150–400)
RDW: 13.2 % (ref 11.5–15.5)
WBC: 10.8 10*3/uL — ABNORMAL HIGH (ref 4.0–10.5)

## 2011-06-25 LAB — GLUCOSE, CAPILLARY
Glucose-Capillary: 151 mg/dL — ABNORMAL HIGH (ref 70–99)
Glucose-Capillary: 42 mg/dL — CL (ref 70–99)
Glucose-Capillary: 71 mg/dL (ref 70–99)

## 2011-06-26 LAB — CALCIUM, IONIZED: Calcium, Ion: 1.2 mmol/L (ref 1.12–1.32)

## 2011-06-26 LAB — CBC
HCT: 22.2 % — ABNORMAL LOW (ref 36.0–46.0)
MCV: 91.4 fL (ref 78.0–100.0)
Platelets: 153 10*3/uL (ref 150–400)
RBC: 2.43 MIL/uL — ABNORMAL LOW (ref 3.87–5.11)
WBC: 5.5 10*3/uL (ref 4.0–10.5)

## 2011-06-26 LAB — BASIC METABOLIC PANEL
CO2: 18 mEq/L — ABNORMAL LOW (ref 19–32)
Chloride: 116 mEq/L — ABNORMAL HIGH (ref 96–112)
Creatinine, Ser: 1.23 mg/dL — ABNORMAL HIGH (ref 0.50–1.10)
Potassium: 3.2 mEq/L — ABNORMAL LOW (ref 3.5–5.1)

## 2011-06-26 LAB — GLUCOSE, CAPILLARY
Glucose-Capillary: 130 mg/dL — ABNORMAL HIGH (ref 70–99)
Glucose-Capillary: 184 mg/dL — ABNORMAL HIGH (ref 70–99)
Glucose-Capillary: 135 mg/dL — ABNORMAL HIGH (ref 70–99)
Glucose-Capillary: 144 mg/dL — ABNORMAL HIGH (ref 70–99)

## 2011-06-27 LAB — RENAL FUNCTION PANEL
CO2: 24 mEq/L (ref 19–32)
GFR calc Af Amer: 39 mL/min — ABNORMAL LOW (ref >60)
Glucose, Bld: 99 mg/dL (ref 70–99)
Phosphorus: 2.8 mg/dL (ref 2.3–4.6)
Potassium: 4.2 mEq/L (ref 3.5–5.1)
Sodium: 137 mEq/L (ref 135–145)

## 2011-06-27 LAB — CBC
Hemoglobin: 9.4 g/dL — ABNORMAL LOW (ref 12.0–15.0)
RBC: 3.04 MIL/uL — ABNORMAL LOW (ref 3.87–5.11)

## 2011-06-27 LAB — GLUCOSE, CAPILLARY
Glucose-Capillary: 81 mg/dL (ref 70–99)
Glucose-Capillary: 112 mg/dL — ABNORMAL HIGH (ref 70–99)

## 2011-07-15 ENCOUNTER — Encounter: Payer: Self-pay | Admitting: Family Medicine

## 2011-07-15 ENCOUNTER — Ambulatory Visit: Payer: Medicare Other | Admitting: Family Medicine

## 2011-07-15 ENCOUNTER — Ambulatory Visit (INDEPENDENT_AMBULATORY_CARE_PROVIDER_SITE_OTHER): Payer: Medicare Other | Admitting: Family Medicine

## 2011-07-15 VITALS — BP 95/57 | HR 81 | Temp 98.4°F | Wt 176.0 lb

## 2011-07-15 DIAGNOSIS — M549 Dorsalgia, unspecified: Secondary | ICD-10-CM

## 2011-07-15 DIAGNOSIS — R1115 Cyclical vomiting syndrome unrelated to migraine: Secondary | ICD-10-CM

## 2011-07-15 MED ORDER — MIRTAZAPINE 30 MG PO TABS: 30.0000 mg | ORAL_TABLET | Freq: Every day | ORAL | Status: DC

## 2011-07-15 MED ORDER — DOCUSATE SODIUM 100 MG PO CAPS: 100.0000 mg | ORAL_CAPSULE | Freq: Two times a day (BID) | ORAL | Status: DC | PRN

## 2011-07-15 MED ORDER — CYCLOBENZAPRINE HCL 5 MG PO TABS: 10.0000 mg | ORAL_TABLET | Freq: Three times a day (TID) | ORAL | Status: DC | PRN

## 2011-07-15 MED ORDER — METFORMIN HCL 1000 MG PO TABS: 1000.0000 mg | ORAL_TABLET | Freq: Two times a day (BID) | ORAL | Status: DC

## 2011-07-15 MED ORDER — PROMETHAZINE HCL 25 MG PO TABS: 25.0000 mg | ORAL_TABLET | ORAL | Status: DC

## 2011-07-15 MED ORDER — CARVEDILOL 12.5 MG PO TABS: 12.5000 mg | ORAL_TABLET | Freq: Two times a day (BID) | ORAL | Status: DC

## 2011-07-15 MED ORDER — LISINOPRIL 2.5 MG PO TABS: 2.5000 mg | ORAL_TABLET | Freq: Every day | ORAL | Status: DC

## 2011-07-15 MED ORDER — LORAZEPAM 1 MG PO TABS: 1.0000 mg | ORAL_TABLET | Freq: Four times a day (QID) | ORAL | Status: DC | PRN

## 2011-07-15 MED ORDER — LIDOCAINE 5 % EX PTCH: 1.0000 | MEDICATED_PATCH | Freq: Two times a day (BID) | CUTANEOUS | Status: DC

## 2011-07-15 MED ORDER — HYDROCODONE-ACETAMINOPHEN 5-500 MG PO TABS: 1.0000 | ORAL_TABLET | Freq: Three times a day (TID) | ORAL | Status: DC | PRN

## 2011-07-15 MED ORDER — OMEPRAZOLE 20 MG PO CPDR: 20.0000 mg | DELAYED_RELEASE_CAPSULE | Freq: Every day | ORAL | Status: DC

## 2011-07-15 MED ORDER — INSULIN GLARGINE 100 UNIT/ML ~~LOC~~ SOLN: 22.0000 [IU] | SUBCUTANEOUS | Status: DC

## 2011-07-15 MED ORDER — INSULIN ASPART 100 UNIT/ML ~~LOC~~ SOLN: 6.0000 [IU] | Freq: Three times a day (TID) | SUBCUTANEOUS | Status: DC

## 2011-07-15 NOTE — Patient Instructions (Signed)
i have refilled your medications on paper today I have given you 2 refills (33months) of ativan and vicodin. You will have to come in and see me for refills of that   Please come back in one week on the new blood pressure meds to check on you.

## 2011-07-18 NOTE — Discharge Summary (Signed)
NAMEMarland Kitchen  [name] [name], [name] [name]               ACCOUNT NO.:  [id number]  MEDICAL RECORD NO.:  [id number]  LOCATION:  [id number]                         FACILITY:  [facility]  PHYSICIAN:  [name] [name], M.D.    DATE OF BIRTH:  07-04-1966  DATE OF ADMISSION:  06/24/2011 DATE OF DISCHARGE:  06/27/2011                              DISCHARGE SUMMARY   PRIMARY CARE PROVIDER:  Mariana Arn, MD at [facility]: 1. Vomiting/dehydration. 2. Diabetes. 3. Hypertension. 4. Acute renal failure. 5. Psych/depression.  DISCHARGE MEDICATIONS: 1. Amlodipine 10 mg 1 tablet by mouth daily. 2. Aspirin enteric coated 81 mg 1 tablet by mouth daily. 3. Ativan 1 mg 1 tablet by mouth every 6 hours as needed for anxiety. 4. Benadryl over-the-counter 1 tablet by mouth daily as needed for     sinuses. 5. Coreg 25 mg 1 tablet by mouth twice daily. 6. Flexeril 10 mg 1 tablet by mouth three times a day as needed for     muscle spasms. 7. Hydrocodone/APAP 5/325 one to two tablets by mouth every 4 hours as     needed for pain. 8. Insulin 6 units subcu three times a day with meals. 9. Lantus 22 units daily at bedtime. 10.Lidocaine 5% patch one patch probably every 24 hours. 11.Mirtazapine 30 mg 1 tablet by mouth daily at bedtime. 12.Reglan 5 mg 1 tablet by mouth three times a day. 13.Nicotine patch transdermally daily as needed for smoking cessation. 14.Oxycodone 5 mg 1 tablet by mouth every 4 hours as needed for severe     pain. 15.Phenergan 25 mg 1 tablet by mouth every 4 hours as needed for     nausea. 16.Prilosec 20 mg 1 capsule by mouth twice daily. 17.Promethazine 25 mg one injection subcu three times a day as needed     for nausea, vomiting. 18.Promethazine rectal suppository 25 mg one suppository rectally     every 6 hours as needed for nausea. 19.Promethazine 25 mg 1 tablet by mouth every 6 hours as needed for     nausea. 20.Sucralfate 10 mL 1 tablet by mouth before meals and at  bedtime.  CONSULTS:  None.  PROCEDURES:  None.  PERTINENT LABS AT DISCHARGE:  CBC; white count 4.9, hemoglobin 9.4, hematocrit 27.5, platelets 194.  BMET; sodium 134, potassium 4.2, chloride 107, CO2 24, BUN 13, creatinine 1.69, glucose 99.  BRIEF HOSPITAL COURSE:  This is a 45 year old female with a history of type 2 diabetes, unspecified anemia, hypertension, and cyclic vomiting who presents with nausea and vomiting. 1. Vomiting.  This is a typical presentation for cyclic vomiting,     perhaps triggered by marijuana use.  The plan was to treat her     symptoms with her usual medications that we used when she was     admitted to the hospital previously.  Patient was started on Dilaudid PCA,     scheduled Phenergan, Zofran, Ativan for nausea and vomiting.  The     patient was also started on Reglan and was initially started on a     clears only diet.  On hospital day #2, the patient was no longer  vomiting.  She was tolerating a clear diet.  Throughout the     hospital course, her diet was advanced as tolerated.  On the day of     discharge, the patient was eating a full regular diet without any     difficulty.  On the day of discharge, we were able to discontinue     her Dilaudid PCA and start her on her home medications of Vicodin     q.4 h. as needed for pain and also oxycodone 5 mg q.4 h. as needed     for severe pain.  The patient was agreeable to be discharged home     with close followup with PCP. 2. Diabetes.  On admission, the patient was started on sliding scale     insulin and half her normal dose of Lantus.  Her blood sugars were     followed every 4 hours.  She had a few instances in the early     morning where her blood glucose went lower than 50 but was     corrected with carb snacks.  The patient will be discharged home on     her home regimen of Lantus 22 units subcu at bedtime with NovoLog     60 units subcu three times a day with meals.  The patient to follow      up with her new PCP for further diabetic management. 3. Hypertension.  Blood pressure was initially elevated.  We used IV     hydralazine as needed for elevated blood pressures.  Once the     patient was able to tolerate p.o., she was restarted on her home     medications of Coreg and amlodipine.  The patient's blood pressure     at the time of discharge was 145/84 which is the patient's baseline     blood pressure.  She is to follow up with PCP for this chronic     issue. 4. Acute renal failure.  The patient's baseline creatinine is 1.8.  We     hydrated with normal saline at 100 mL an hour and trended the     patient's creatinine throughout the hospital course.  On the day of     discharge, her creatinine was 1.69.  We discontinued the patient's     fluids and encouraged p.o. hydration. 5. Depression.  Once the patient was able to tolerate p.o.'s, we     restarted her home mirtazapine.  DISCHARGE INSTRUCTIONS:  ACTIVITY:  Increase activity slowly.  DIET:  Low sodium carb-modified.  FOLLOWUP APPOINTMENTS:  Return to Dr. Otilio [name] at [facility]  The patient is to schedule appointment with [name] [name] in 1-2 weeks.  SPECIAL INSTRUCTIONS:  Avoid straining and stop any activity that causes chest pain, shortness of breath, dizziness, sweating, or excessive weakness.  If you begin to vomit at home, please take Phenergan as needed either by mouth, per rectum, or IM injection.  If this does not resolve the vomiting, please call the clinic and talk to your MD.  DISCHARGE CONDITION:  The patient was discharged home in stable medical condition.    ______________________________ [name] Rossetti, MD   ______________________________ [name] [name], M.D.    ID/MEDQ  D:  06/28/2011  T:  06/29/2011  Job:  [id number]  Electronically Signed by [name] Rossetti MD on 07/14/2011 06:29:05 PM Electronically Signed by [name] [name] M.D. on 07/18/2011 04:46:11 PM

## 2011-07-19 ENCOUNTER — Emergency Department (HOSPITAL_COMMUNITY): Payer: Medicare Other

## 2011-07-19 ENCOUNTER — Encounter: Payer: Self-pay | Admitting: Family Medicine

## 2011-07-19 ENCOUNTER — Inpatient Hospital Stay (HOSPITAL_COMMUNITY)
Admission: EM | Admit: 2011-07-19 | Discharge: 2011-07-21 | DRG: 313 | Disposition: A | Discharge status: Home / Self Care | Payer: Medicare Other | Attending: Family Medicine | Admitting: Family Medicine

## 2011-07-19 DIAGNOSIS — G8929 Other chronic pain: Secondary | ICD-10-CM | POA: Diagnosis present

## 2011-07-19 DIAGNOSIS — R0789 Other chest pain: Principal | ICD-10-CM | POA: Diagnosis present

## 2011-07-19 DIAGNOSIS — E1149 Type 2 diabetes mellitus with other diabetic neurological complication: Secondary | ICD-10-CM | POA: Diagnosis present

## 2011-07-19 DIAGNOSIS — I10 Essential (primary) hypertension: Secondary | ICD-10-CM | POA: Diagnosis present

## 2011-07-19 DIAGNOSIS — F17200 Nicotine dependence, unspecified, uncomplicated: Secondary | ICD-10-CM | POA: Diagnosis present

## 2011-07-19 DIAGNOSIS — Z7982 Long term (current) use of aspirin: Secondary | ICD-10-CM

## 2011-07-19 DIAGNOSIS — Z79899 Other long term (current) drug therapy: Secondary | ICD-10-CM

## 2011-07-19 DIAGNOSIS — E1142 Type 2 diabetes mellitus with diabetic polyneuropathy: Secondary | ICD-10-CM | POA: Diagnosis present

## 2011-07-19 DIAGNOSIS — D649 Anemia, unspecified: Secondary | ICD-10-CM | POA: Diagnosis present

## 2011-07-19 DIAGNOSIS — Z794 Long term (current) use of insulin: Secondary | ICD-10-CM

## 2011-07-19 DIAGNOSIS — F341 Dysthymic disorder: Secondary | ICD-10-CM | POA: Diagnosis present

## 2011-07-19 DIAGNOSIS — M549 Dorsalgia, unspecified: Secondary | ICD-10-CM | POA: Diagnosis present

## 2011-07-19 DIAGNOSIS — K219 Gastro-esophageal reflux disease without esophagitis: Secondary | ICD-10-CM | POA: Diagnosis present

## 2011-07-19 LAB — RAPID URINE DRUG SCREEN, HOSP PERFORMED
Amphetamines: NOT DETECTED
Barbiturates: NOT DETECTED
Cocaine: NOT DETECTED
Tetrahydrocannabinol: POSITIVE — AB

## 2011-07-19 LAB — CK TOTAL AND CKMB (NOT AT ARMC)
CK, MB: 2.1 ng/mL (ref 0.3–4.0)
Total CK: 108 U/L (ref 7–177)

## 2011-07-19 LAB — CARDIAC PANEL(CRET KIN+CKTOT+MB+TROPI): Total CK: 121 U/L (ref 7–177)

## 2011-07-19 NOTE — H&P (Signed)
ADDENDUM  Subjective: I have seen and examined the pt and agree with PGY1 note.  Please see note for details, but briefly, this is a 45 yo F well known to our service presenting with chest pain x 7 hours.  It started this afternoon around 1pm when she was sitting at her sister's house watching TV.  Nothing brought it on.  It comes and goes.  It is mostly pressure in nature, with occasional sharp pains behind left breast.  Does not radiate to neck, jaw, or shoulder.  Is not brought on by activity, is not relieved by rest. Nitro by EMS did not help with CP but did cause HA.  No diaphoresis, dizziness, vision changes, SOB. + nausea, no vomiting.  No fevers or chills.  Cannot think of any new activities.  No movement reproduces it.    Has been taking BP and GERD meds every day, as Rx'ed.  Has never had this before.   See PGY1 note for full past medical, family, social history. HTN DM GERD Anxiety/depression Tob use THC use Denies EtOH or other drugs  ROS: per HPI   Objective: Vitals: 97.9 160/96  61 20 100% RA Gen: NAD, tearful at times HEENT: MMM, EOMI, no scleral icterus, no pharyngeal erythema or exudate CV:RRR, no murmur; TTP over distal sternum, no obvious deformities (pain reproducible) Pulm: CTAB, no wheezes or crackles Abd: soft, ND, + tenderness over epigastric area, otherwise non-tender Ext: WWP, no edema  Results for orders placed during the hospital encounter of 07/19/11 (from the past 24 hour(s))  CK TOTAL AND CKMB     Status: Normal   Collection Time   07/19/11  3:38 PM      Component Value Range   Total CK 108  7 - 177 (U/L)   CK, MB 2.1  0.3 - 4.0 (ng/mL)   Relative Index 1.9  0.0 - 2.5   TROPONIN I     Status: Normal   Collection Time   07/19/11  3:38 PM      Component Value Range   Troponin I <0.30  <0.30 (ng/mL)   EKG: NSR, no abnormalities, HR 63 CXR: no acute abnormalities   A/P: 46 yo F w/ PMH sig for HTN, DM, GERD, tob use p/w chest pain, atypical in  nature 1. Chest Pain: Most likely 2/2 GERD vs MSK but ddx also includes atypical cardiac cause, anxiety.  Will finish cycling cardiac enzymes, first set negative. EKG WNL, will repeat in morning.  CXR WNL, will repeat in morning.  Will place on telemetry and monitor overnight. Will check UDS,  Continue home ativan, dilaudid PRN, heating/cooling pack to area.  Will give GI cocktail x1 now as this seems c/w GERD.  Will also check BMET and CBC.  Lidoderm patch PRN.   2. Nausea: This is often pt's presenting complaint.  Will give phenergan, zofran, and ativan PRN to stay ahead of cyclic nausea and vomiting.  Will also continue pt's prilosec, reglan, carafate.  3. HTN: Pt with elevated BPs at this time, but also in pain.  Will continue home regimen of lisinopril, coreg, ASA.    4. DM: A1c checked ~ 52mo ago (8.6 05/2011).  Will hold pt's home med regimen and put on sensitive SSI. Will likely be able to restart home meds in AM if pt's nausea resolves.  5. Tob abuse: Nicotine patch (7mg ) as pt uses this at home (only smokes ~ 1/3 ppd)  6. FEN/GI: heart healthy, carb mod diet; SLIV,  consider IVF if not tolerating po but does not appear clinically dehydrated, last ate at noon  7. Ppx: prilosec, SQ heparin  8. Dispo: pending clinical improvement, CP r/o  Trusten Hume

## 2011-07-19 NOTE — H&P (Signed)
[facility] Admission History and Physical  Patient name: [name] [name] Medical record number: [id number] Date of birth: [birth year] Age: 45 y.o. Gender: female  Primary Care Provider: Marlana Salvage, MD  Chief Complaint: Chest pain History of Present Illness: [name] [name] is a 45 y.o. year old female presenting with chest pain that started about 1:30 pm today no related with excertion. The pain is located on mid sternum and epigastrium and it is described as pressure of intensity10/10 and irradiates to the left breast. Pain is exacerbated with palpation and alleviates some with morphine now 5/10.  The pain was not modified with 2 nitroglycerins given by EMS. This event is the first time she has this kind of pain and was associated with nausea no vomiting, and she felt clammy per her description. Also complaints about intermittent  left arm muscular spasms for about a week. EKG and Cardiac Enzymes times 1 are negative but we decide to admit for close f/u and rule out cardiac event.            Patient Active Problem List  Diagnoses  . NEUROPATHY, DIABETIC  . DIABETES MELLITUS, II, COMPLICATIONS  . UNSPECIFIED ANEMIA  . DEPRESSION, MAJOR, RECURRENT  . ANXIETY  . TOBACCO DEPENDENCE  . MIGRAINE, UNSPEC., W/O INTRACTABLE MIGRAINE  . HYPERTENSION, BENIGN SYSTEMIC  . GERD  . PERSISTENT VOMITING  . PANCREATITIS  . UNSPECIFIED VAGINITIS AND VULVOVAGINITIS  . VAGINAL PRURITUS  . CALLUSES, FEET, BILATERAL  . BACK PAIN, CHRONIC  . INSOMNIA NOS  . ABDOMINAL PAIN  . HIGH RISK PATIENT  . MUSCLE SPASM, TRAPEZIUS   Past Medical History: No past medical history on file.  Past Surgical History: No past surgical history on file.  Social History: History   Social History  . Marital Status: Single    Spouse Name: N/A    Number of Children: N/A  . Years of Education: N/A   Social History Main Topics  . Smoking status: Current Everyday Smoker -- 0.3 packs/day     Types: Cigarettes  . Smokeless tobacco: Never Used  . Alcohol Use: Not on file  . Drug Use: Not on file  . Sexually Active: Not on file   Other Topics Concern  . Not on file   Social History Narrative  . No narrative on file    Family History: No family history on file.  Allergies: Allergies  Allergen Reactions  . Erythromycin Nausea And Vomiting  . Oxycodone-Acetaminophen Itching    Current Outpatient Prescriptions  Medication Sig Dispense Refill  . aspirin 81 MG tablet Take 81 mg by mouth daily.        . carvedilol (COREG) 12.5 MG tablet Take 1 tablet (12.5 mg total) by mouth 2 (two) times daily with a meal.  60 tablet  11  . cyclobenzaprine (FLEXERIL) 5 MG tablet Take 2 tablets (10 mg total) by mouth 3 (three) times daily as needed.  21 tablet  4  . docusate sodium (COLACE) 100 MG capsule Take 1 capsule (100 mg total) by mouth 2 (two) times daily as needed.  30 capsule  11  . EPINEPHrine (EPIPEN) 0.3 MG/0.3ML DEVI One dose Im as needed for severe anaphylaxis.       Marland Kitchen HYDROcodone-acetaminophen (VICODIN) 5-500 MG per tablet Take 1-2 tablets by mouth every 8 (eight) hours as needed for pain.  60 tablet  2  . insulin aspart (NOVOLOG) 100 UNIT/ML injection Inject 6 Units into the skin 3 (three) times daily before  meals. Dispense as Pen  3 mL  6  . insulin glargine (LANTUS) 100 UNIT/ML injection Inject 22 Units into the skin every morning. .  10 mL  6  . lidocaine (LIDODERM) 5 % Place 1 patch onto the skin every 12 (twelve) hours. Remove & Discard patch within 12 hours or as directed by MD  30 patch  6  . lisinopril (ZESTRIL) 2.5 MG tablet Take 1 tablet (2.5 mg total) by mouth daily.  30 tablet  11  . LORazepam (ATIVAN) 1 MG tablet Take 1 tablet (1 mg total) by mouth every 6 (six) hours as needed. For nausea.  30 tablet  3  . metFORMIN (GLUCOPHAGE) 1000 MG tablet Take 1 tablet (1,000 mg total) by mouth 2 (two) times daily with a meal.  60 tablet  11  . mirtazapine (REMERON) 30  MG tablet Take 1 tablet (30 mg total) by mouth at bedtime.  30 tablet  11  . omeprazole (PRILOSEC) 20 MG capsule Take 1 capsule (20 mg total) by mouth daily.  30 capsule  11  . polyethylene glycol (MIRALAX) powder Mix packet with water once daily as needed for constipation.       . promethazine (PHENERGAN) 25 MG suppository Place 25 mg rectally every 6 (six) hours as needed.        . promethazine (PHENERGAN) 25 MG tablet Take 1 tablet (25 mg total) by mouth every 4 (four) hours. At first sign of nausea.   120 tablet  3  . PROMETHAZINE HCL IM Inject 25 mg (73mL) IM every 6 hours as needed.       . sucralfate (CARAFATE) 1 G tablet Take 1 g by mouth 4 (four) times daily -  with meals and at bedtime.         Review Of Systems: Per HPI  Otherwise 12 point review of systems was performed and was unremarkable.  Physical Exam: Pulse: 68  Blood Pressure:160/80        RR: 19   O2: 100 on RA Temp: 98  General: alert, cooperative and appears stated age 45: PERRLA, sclera clear, anicteric, oropharynx clear, no lesions and neck supple with midline trachea Heart: S1, S2 normal, no murmur, rub or gallop, regular rate and rhythm Lungs: clear to auscultation, no wheezes or rales and unlabored breathing Abdomen: abdomen is soft without significant tenderness, masses, organomegaly or guarding Extremities: extremities normal, atraumatic, no cyanosis or edema Skin:no rashes, no petechiae Neurology: normal without focal findings and mental status, speech normal, alert and oriented x3  Labs and Imaging: Lab Results  Component Value Date/Time   NA 137 06/27/2011  5:40 AM   K 4.2 06/27/2011  5:40 AM   CL 107 06/27/2011  5:40 AM   CO2 24 06/27/2011  5:40 AM   BUN 13 06/27/2011  5:40 AM   CREATININE 1.69* 06/27/2011  5:40 AM   GLUCOSE 99 06/27/2011  5:40 AM   Lab Results  Component Value Date   WBC 4.9 06/27/2011   HGB 9.4* 06/27/2011   HCT 27.5* 06/27/2011   MCV 90.5 06/27/2011   PLT 194 06/27/2011    EKG:   Assessment  and Plan: [name] [name] is a 45 y.o. year old female presenting with Chest pain. 1. Rule out MI: Pt with uncharacteristic chest pain no related to excerption that exacerbates with palpation and does not alleviate with nitroglycerin. In our differential we include GI origin like GERD or  musculoskeletal  like costochondritis. We need to rule  out cardiovascular origin due to patient risks factors (obesity, Hipertension, DM and maternal Hx of heart attack) We will cycle cardiac enzymes and repeat EKG and CXR in the morning. 2. HTN systolic mildly elevated she is on Lisinopril 5mg . We will monitor and adjust antihypertensive treatment if needed. 3. DM on SSI we will monitor blood glucose and CBG's  4. FEN/GI: Diabetic diet  5. Prophylaxis: Heparin s/c 6. Disposition: Pending patient improvement.

## 2011-07-20 ENCOUNTER — Inpatient Hospital Stay (HOSPITAL_COMMUNITY): Payer: Medicare Other

## 2011-07-20 DIAGNOSIS — R0789 Other chest pain: Secondary | ICD-10-CM

## 2011-07-20 LAB — GLUCOSE, CAPILLARY
Glucose-Capillary: 159 mg/dL — ABNORMAL HIGH (ref 70–99)
Glucose-Capillary: 107 mg/dL — ABNORMAL HIGH (ref 70–99)

## 2011-07-20 LAB — CBC
HCT: 31.3 % — ABNORMAL LOW (ref 36.0–46.0)
Hemoglobin: 10.5 g/dL — ABNORMAL LOW (ref 12.0–15.0)
MCH: 30.9 pg (ref 26.0–34.0)
MCV: 90.5 fL (ref 78.0–100.0)
MCV: 89.6 fL (ref 78.0–100.0)
Platelets: 212 10*3/uL (ref 150–400)
RBC: 3.66 MIL/uL — ABNORMAL LOW (ref 3.87–5.11)
RDW: 14 % (ref 11.5–15.5)
RDW: 13.6 % (ref 11.5–15.5)
WBC: 5 10*3/uL (ref 4.0–10.5)
WBC: 4.3 10*3/uL (ref 4.0–10.5)

## 2011-07-20 LAB — BASIC METABOLIC PANEL
BUN: 28 mg/dL — ABNORMAL HIGH (ref 6–23)
BUN: 25 mg/dL — ABNORMAL HIGH (ref 6–23)
CO2: 22 mEq/L (ref 19–32)
CO2: 24 mEq/L (ref 19–32)
Calcium: 9.5 mg/dL (ref 8.4–10.5)
Chloride: 105 mEq/L (ref 96–112)
Chloride: 108 mEq/L (ref 96–112)
Creatinine, Ser: 1.59 mg/dL — ABNORMAL HIGH (ref 0.50–1.10)
Creatinine, Ser: 1.6 mg/dL — ABNORMAL HIGH (ref 0.50–1.10)
Glucose, Bld: 145 mg/dL — ABNORMAL HIGH (ref 70–99)

## 2011-07-20 LAB — HEMOGLOBIN A1C: Hgb A1c MFr Bld: 7.6 % — ABNORMAL HIGH (ref <5.7)

## 2011-07-20 LAB — CARDIAC PANEL(CRET KIN+CKTOT+MB+TROPI)
Relative Index: INVALID (ref 0.0–2.5)
Total CK: 98 U/L (ref 7–177)

## 2011-07-20 NOTE — Progress Notes (Signed)
  Subjective:    Patient ID: [name] [name], female    DOB: [birth year], 45 y.o.   MRN: [id number]  HPI Recent hospital admission for cyclic vomitting.  Feeling better now.  Not eating much but able to stay hydrated.  I reviewed her medications and updated her chart.  I also refilled her medications.   Review of Systems Denies CP, SOB, HA, N/V/D, fever     Objective:   Physical Exam    Vital signs reviewed General appearance - alert, well appearing, and in no distress and oriented to person, place, and time Heart - normal rate, regular rhythm, normal S1, S2, no murmurs, rubs, clicks or gallops Chest - clear to auscultation, no wheezes, rales or rhonchi, symmetric air entry, no tachypnea, retractions or cyanosis Abdomen - soft, nontender, nondistended, no masses or organomegaly     Assessment & Plan:

## 2011-07-20 NOTE — Assessment & Plan Note (Signed)
Recent hospital admission for cyclic vomitting.  Feeling better.  Refilled meds.  Will monitor closely

## 2011-07-21 LAB — BASIC METABOLIC PANEL
BUN: 23 mg/dL (ref 6–23)
CO2: 21 mEq/L (ref 19–32)
Chloride: 104 mEq/L (ref 96–112)
GFR calc non Af Amer: 31 mL/min — ABNORMAL LOW (ref >60)
Glucose, Bld: 176 mg/dL — ABNORMAL HIGH (ref 70–99)
Potassium: 4.2 mEq/L (ref 3.5–5.1)
Sodium: 136 mEq/L (ref 135–145)

## 2011-07-21 LAB — CBC
HCT: 31.3 % — ABNORMAL LOW (ref 36.0–46.0)
Hemoglobin: 10.8 g/dL — ABNORMAL LOW (ref 12.0–15.0)
RBC: 3.52 MIL/uL — ABNORMAL LOW (ref 3.87–5.11)
WBC: 4.2 10*3/uL (ref 4.0–10.5)

## 2011-07-21 LAB — GLUCOSE, CAPILLARY: Glucose-Capillary: 249 mg/dL — ABNORMAL HIGH (ref 70–99)

## 2011-07-23 ENCOUNTER — Ambulatory Visit (INDEPENDENT_AMBULATORY_CARE_PROVIDER_SITE_OTHER): Payer: Medicare Other | Admitting: Family Medicine

## 2011-07-23 ENCOUNTER — Encounter: Payer: Self-pay | Admitting: Family Medicine

## 2011-07-23 DIAGNOSIS — I10 Essential (primary) hypertension: Secondary | ICD-10-CM

## 2011-07-23 DIAGNOSIS — R1115 Cyclical vomiting syndrome unrelated to migraine: Secondary | ICD-10-CM

## 2011-07-23 DIAGNOSIS — E1165 Type 2 diabetes mellitus with hyperglycemia: Secondary | ICD-10-CM

## 2011-07-23 DIAGNOSIS — M549 Dorsalgia, unspecified: Secondary | ICD-10-CM

## 2011-07-23 DIAGNOSIS — F339 Major depressive disorder, recurrent, unspecified: Secondary | ICD-10-CM

## 2011-07-23 MED ORDER — LORAZEPAM 1 MG PO TABS: 1.0000 mg | ORAL_TABLET | Freq: Four times a day (QID) | ORAL | Status: DC | PRN

## 2011-07-23 MED ORDER — SUCRALFATE 1 G PO TABS: 1.0000 g | ORAL_TABLET | Freq: Three times a day (TID) | ORAL | Status: DC

## 2011-07-23 MED ORDER — INSULIN GLARGINE 100 UNIT/ML ~~LOC~~ SOLN: 22.0000 [IU] | SUBCUTANEOUS | Status: DC

## 2011-07-23 MED ORDER — "SYRINGE/NEEDLE (DISP) 23G X 1-1/2"" 3 ML MISC": 1.0000 | Freq: Once | Status: DC

## 2011-07-23 MED ORDER — OMEPRAZOLE 20 MG PO CPDR: 20.0000 mg | DELAYED_RELEASE_CAPSULE | Freq: Every day | ORAL | Status: DC

## 2011-07-23 MED ORDER — CYCLOBENZAPRINE HCL 5 MG PO TABS: 10.0000 mg | ORAL_TABLET | Freq: Three times a day (TID) | ORAL | Status: DC | PRN

## 2011-07-23 MED ORDER — HYDROCODONE-ACETAMINOPHEN 5-500 MG PO TABS: 1.0000 | ORAL_TABLET | Freq: Three times a day (TID) | ORAL | Status: DC | PRN

## 2011-07-23 MED ORDER — POLYETHYLENE GLYCOL 3350 17 GM/SCOOP PO POWD: 17.0000 g | Freq: Every day | ORAL | Status: DC

## 2011-07-23 MED ORDER — "INSULIN SYRINGE-NEEDLE U-100 29G X 1/2"" 0.3 ML MISC": 1.0000 | Freq: Once | Status: DC

## 2011-07-23 MED ORDER — METFORMIN HCL 1000 MG PO TABS: 1000.0000 mg | ORAL_TABLET | Freq: Two times a day (BID) | ORAL | Status: DC

## 2011-07-23 MED ORDER — MIRTAZAPINE 30 MG PO TABS: 30.0000 mg | ORAL_TABLET | Freq: Every day | ORAL | Status: DC

## 2011-07-23 MED ORDER — INSULIN ASPART 100 UNIT/ML ~~LOC~~ SOLN: 6.0000 [IU] | Freq: Three times a day (TID) | SUBCUTANEOUS | Status: DC

## 2011-07-23 MED ORDER — PROMETHAZINE HCL 25 MG/ML IJ SOLN: 25.0000 mg | Freq: Four times a day (QID) | INTRAMUSCULAR | Status: DC | PRN

## 2011-07-23 MED ORDER — LISINOPRIL 2.5 MG PO TABS: 2.5000 mg | ORAL_TABLET | Freq: Every day | ORAL | Status: DC

## 2011-07-23 MED ORDER — CARVEDILOL 12.5 MG PO TABS: 12.5000 mg | ORAL_TABLET | Freq: Two times a day (BID) | ORAL | Status: DC

## 2011-07-23 MED ORDER — PROMETHAZINE HCL 25 MG RE SUPP: 25.0000 mg | Freq: Four times a day (QID) | RECTAL | Status: DC | PRN

## 2011-07-23 MED ORDER — PROMETHAZINE HCL 25 MG PO TABS: 25.0000 mg | ORAL_TABLET | ORAL | Status: DC

## 2011-07-23 MED ORDER — DOCUSATE SODIUM 100 MG PO CAPS: 100.0000 mg | ORAL_CAPSULE | Freq: Two times a day (BID) | ORAL | Status: DC | PRN

## 2011-07-23 MED ORDER — LIDOCAINE 5 % EX PTCH: 1.0000 | MEDICATED_PATCH | Freq: Two times a day (BID) | CUTANEOUS | Status: DC

## 2011-07-23 NOTE — Progress Notes (Signed)
  Subjective:    Patient ID: [name] [name], female    DOB: [birth year], 45 y.o.   MRN: [id number]  HPI HTN-  Was up in the hospital.  Taking all meds.  No CP, HA, or SOB now.  Was admitted for CP but ruled out.  Depression-  Some sadness about pain and worry because mom died of MI with DM.  Taking meds no SI/HI  vomitting-  Needs refills of meds.  Having some nausea today, no vomitting.  Phenergan helps her.  She does not know of anything that would help keep her out of hospital in addition to current tx.   DM-  Using insulin, checking CBGs.  No lows.   Review of Systems    Denies CP, SOB, HA, V/D, fever  Objective:   Physical Exam  Vital signs reviewed General appearance - alert, well appearing, and in no distress and oriented to person, place, and time Heart - normal rate, regular rhythm, normal S1, S2, no murmurs, rubs, clicks or gallops Chest - clear to auscultation, no wheezes, rales or rhonchi, symmetric air entry, no tachypnea, retractions or cyanosis Abdomen - soft, nontender, nondistended, no masses or organomegaly        Assessment & Plan:   HYPERTENSION, BENIGN SYSTEMIC Improved.  BP controlled.  No changes to meds  DEPRESSION, MAJOR, RECURRENT A little down and flat today.  Worried about pain, going to family reunion today.  Continue current meds and see back in 2 weeks.  PERSISTENT VOMITING A little nausea.  Needs phenergan refilled today.  Advised to watch diet during family reunion  DIABETES MELLITUS, II, COMPLICATIONS Needs meds refilled.   Lab Results  Component Value Date   HGBA1C 7.6* 07/19/2011   Continue current regimen

## 2011-07-23 NOTE — Assessment & Plan Note (Signed)
Needs meds refilled.   Lab Results  Component Value Date   HGBA1C 7.6* 07/19/2011   Continue current regimen

## 2011-07-23 NOTE — Assessment & Plan Note (Signed)
A little down and flat today.  Worried about pain, going to family reunion today.  Continue current meds and see back in 2 weeks.

## 2011-07-23 NOTE — Assessment & Plan Note (Addendum)
A little nausea.  Needs phenergan refilled today.  Advised to watch diet during family reunion  [facility] Called in ativan #30 with 3 refills  And vicodin #60 with 2 refills  For chronic pain and nausea associated with gastroparesis and cyclic vomitting

## 2011-07-23 NOTE — Patient Instructions (Signed)
Come back and see me in 2-3 weeks to see how you are doing. I am sending the meds to [facility] today

## 2011-07-23 NOTE — Assessment & Plan Note (Signed)
Improved.  BP controlled.  No changes to meds

## 2011-07-29 ENCOUNTER — Encounter: Payer: Self-pay | Admitting: Family Medicine

## 2011-07-29 ENCOUNTER — Ambulatory Visit (INDEPENDENT_AMBULATORY_CARE_PROVIDER_SITE_OTHER): Payer: Medicare Other | Admitting: Family Medicine

## 2011-07-29 VITALS — BP 186/89 | HR 69 | Temp 97.8°F | Wt 176.0 lb

## 2011-07-29 DIAGNOSIS — I10 Essential (primary) hypertension: Secondary | ICD-10-CM

## 2011-07-29 DIAGNOSIS — N951 Menopausal and female climacteric states: Secondary | ICD-10-CM

## 2011-07-29 DIAGNOSIS — R232 Flushing: Secondary | ICD-10-CM

## 2011-07-29 HISTORY — DX: Flushing: R23.2

## 2011-07-29 MED ORDER — PROMETHAZINE HCL 25 MG PO TABS: 25.0000 mg | ORAL_TABLET | ORAL | Status: DC

## 2011-07-29 NOTE — Patient Instructions (Signed)
I will see you again in 2 weeks  Take your blood pressure at the pharmacy occasionally and write them down  Increase the lisinopril to 5mg  per day  Call if you have concerns.

## 2011-07-29 NOTE — Assessment & Plan Note (Signed)
Will not make changes today, but would consider changing remeron to an SSRI or effexor next visit

## 2011-07-29 NOTE — Progress Notes (Signed)
  Subjective:    Patient ID: [name] [name], female    DOB: 27-Dec-1966, 45 y.o.   MRN: [id number]  HPI HTN-  Taking meds.  Switches to 1/2 dose of lisinopril and no norvasc.  Not checking BPs at home.  Has dizzy spells wih standing that involve spots in vision.  Small episode of CP similar to hospital visit that was short lived.  No SOB  Review of Systems See above    Objective:   Physical Exam Vital signs reviewed General appearance - alert, well appearing, and in no distress and oriented to person, place, and time Heart - normal rate, regular rhythm, normal S1, S2, no murmurs, rubs, clicks or gallops Chest - clear to auscultation, no wheezes, rales or rhonchi, symmetric air entry, no tachypnea, retractions or cyanosis        Assessment & Plan:

## 2011-07-29 NOTE — Assessment & Plan Note (Signed)
Worse today but with some orthostasis.  Will increase lisinopril to 5mg .  See back in 2 weeks. Could lower coreg, add hydralyzine at next visit.

## 2011-07-29 NOTE — H&P (Signed)
NAMEMarland Kitchen  [name], [name] [name]               ACCOUNT NO.:  [id number]  MEDICAL RECORD NO.:  [id number]  LOCATION:  [id number]                         FACILITY:  [facility]  PHYSICIAN:  Wisner A. Walker Kehr, M.D.    DATE OF BIRTH:  15-Feb-1966  DATE OF ADMISSION:  06/24/2011 DATE OF DISCHARGE:                             HISTORY & PHYSICAL   PRIMARY CARE PROVIDER:  Mariana Arn, MD, [place].  CHIEF COMPLAINT:  Nausea, vomiting, and abdominal pain.  HISTORY OF PRESENT ILLNESS:  [name] [name] is 45.  She presents with vomiting and abdominal pain starting at 5:00 a.m.  This feels like a typical episode of her cyclic vomiting.  She has tried oral Phenergan and Vicodin, which she was not able to keep down due to vomiting..  She then tried IM injection of Phenergan, which also did not work.  She then presented to the emergency room for further management.  She notes that her vomiting may be slightly blood-tinged, and she has central abdominal pain that does not radiate.  She has normal bowel movements recently. No fevers or chills, chest pain, dyspnea, fatigue, or sweats.  She has been taking her medications normally.  Notes her last marijuana use is 1 week ago.  She is trying to quit, but is not using the outside help that has been previously organized so far.  PAST MEDICAL HISTORY: 1. Diabetic neuropathy. 2. Type 2 diabetes. 3. Depression. 4. Anxiety. 5. Tobacco dependence. 6. Migraine history. 7. Hypertension. 8. GERD. 9. Cyclic vomiting. 10.History of pancreatitis. 11.Vaginal pruritus. 12.Chronic back pain. 13.Insomnia. 14.Abdominal pain. 15.History of muscle spasms.  SURGICAL HISTORY: 1. Cholecystectomy. 2. Total abdominal hysterectomy with right salpingo-oophorectomy     secondary to ovarian abscess. 3. Exploratory laparotomy with lysis of adhesions with small bowel     obstruction. 4. C-sections.  SOCIAL HISTORY:  Single.  Lives alone.  Tenth-grade education.   Works for [employer] relatively recently, but currently not working.  History of marijuana use, relatively recently which she thinks helps her abdominal pain.  Smokes a pack of cigarettes every 3 days.  No alcohol use.  FAMILY HISTORY:  Diabetes.  Mother died of MI in 2003-04-08.  Siblings with hypertension.  ALLERGIES:  ERYTHROMYCIN which causes itching and OXYCODONE which causes itching.  MEDICATIONS: 1. Amlodipine 10 daily. 2. Aspirin 81 daily. 3. Coreg 25 twice a day. 4. Flexeril 5 three times a day as needed. 5. Colace 100 twice a day. 6. EpiPen for anaphylaxis. 7. Vicodin one two q.6 h. p.r.n. 8. Insulin sliding scale 6 units before meals. 9. Lantus 22 units nightly. 10.Ativan 1 mg by mouth every 6 hours as needed for nausea. 11.Metformin 1000 twice a day. 12.Mirtazapine 30 nightly. 13.Prilosec 20 daily. 14.MiraLax as needed for constipation. 15.Phenergan suppository q.6 h. as needed. 16.Phenergan tablets q.4 h. as needed. 17.Phenergan injection 25 mg q.6 h. as needed. 18.Carafate 1 g 4 times a day.  REVIEW OF SYSTEMS:  Negative.  See HPI.  PHYSICAL EXAMINATION:  VITALS:  Heart rate 90 to 100, blood pressure 186 to 221 over 87 to 93, respiratory rate 16, satting 100% on 2 L. GENERAL:  Well.  In no acute distress.  HEENT:  Moist mucous membranes.  Pupils round and reactive to light. Extraocular motion is intact. NECK:  Flat neck veins.  Trachea midline.  No masses. LUNGS:  Clear to auscultation bilaterally with normal work of breathing. HEART:  Regular rate and rhythm with no rubs or gallops. ABDOMEN:  Normoactive bowel sounds, soft, mildly tender.  No rebound. No guarding.  No masses. EXTREMITIES:  Nontender, nonedematous, normal pulses.  Normal capillary refill. SKIN:  No rashes. NEURO:  Alert and oriented x3.  LABORATORY DATA:  Significant for a bicarb of 27, BUN 32, creatinine 1.8, glucose 328, bilirubin is 0.2, AST 9, ALT 14, lipase 84.  CBC: White count 7.1,  hemoglobin 13, platelets 305.  Last A1c was 8.6 on June 04, 2011.  ASSESSMENT AND PLAN:  A 45 year old woman with vomiting and abdominal pain. 1. Vomiting:  Typical for exacerbations of cyclic vomiting, perhaps it     is triggered by marijuana use.  She does note some blood-tinged     vomit and her lipase is very mildly elevated.  Her CMP as well as     the rest of her laboratories do not indicate more dangerous     etiology.  We do not have a lactate at this moment to show no gut     ischemia, and she does not have a metabolic acidosis.  Plan to     treat symptoms with her usual formula that we have developed here     in the hospital.  We will additionally check for gastritis with     Gastroccult and Hemoccult and use Protonix IV.  Our plan for     symptom control is a Dilaudid PCA, scheduled Phenergan, Zofran,     Ativan, and Reglan, clear diet, and fallow hopefully with     improvement.  If worsening, we will broaden differential.  I do not     feel that the lipase is significantly cause of her symptom.  This     is no different than usual for her. 2. Diabetes:  A1c was 8.6 earlier this month.  We will not repeat that     laboratory.  Provide half a normal Lantus dose at 10 per day as she     is not going to be eating very much as well as moderate sliding     scale insulin.  We will follow blood sugars every 4 hours. 3. Hypertension:  Blood pressure elevated.  Gave home Coreg based on     last hospitalization.  We will use IV hydralazine as needed.  We     will follow blood pressure medications measurement. 4. Renal:  Acute renal failure.  Baseline creatinine is 1.47, so it     might be a mild acute on chronic renal failure likely due to     prerenal dehydration.  We will give fluids at 100 an hour and     follow creatinine in the morning. 5. Psych:  Restart home mirtazapine. 6. Fluids, electrolytes, nutrition/gastrointestinal:  As above, we     will use Protonix. 7. Prophylaxis:   Heparin and Protonix. 8. Disposition:  When tolerating home p.o., can go home.     Lynne Leader, MD   ______________________________ Arty Baumgartner Walker Kehr, M.D.    EC/MEDQ  D:  06/24/2011  T:  06/25/2011  Job:  [id number]  Electronically Signed by Lynne Leader  on 07/29/2011 02:24:47 PM Electronically Signed by Candelaria Celeste M.D. on 07/29/2011 03:33:30 PM

## 2011-08-03 ENCOUNTER — Ambulatory Visit (INDEPENDENT_AMBULATORY_CARE_PROVIDER_SITE_OTHER): Payer: Medicare Other | Admitting: Family Medicine

## 2011-08-03 DIAGNOSIS — K3184 Gastroparesis: Secondary | ICD-10-CM

## 2011-08-03 DIAGNOSIS — R1115 Cyclical vomiting syndrome unrelated to migraine: Secondary | ICD-10-CM

## 2011-08-03 MED ORDER — PROMETHAZINE HCL 25 MG/ML IJ SOLN
25.0000 mg | Freq: Four times a day (QID) | INTRAMUSCULAR | Status: DC | PRN
  Administered 2011-08-03 (×2): 25 mg via INTRAMUSCULAR

## 2011-08-03 MED ORDER — MORPHINE SULFATE 2 MG/ML IJ SOLN
2.0000 mg | INTRAMUSCULAR | Status: DC | PRN
  Administered 2011-08-03: 2 mg via INTRAMUSCULAR

## 2011-08-03 MED ORDER — "INSULIN SYRINGE-NEEDLE U-100 29G X 1/2"" 0.3 ML MISC": Status: DC

## 2011-08-03 MED ORDER — LORAZEPAM 2 MG/ML IJ SOLN: 1.0000 mg | INTRAMUSCULAR | Status: DC | PRN

## 2011-08-03 MED ORDER — LORAZEPAM 2 MG/ML IJ SOLN
2.0000 mg | INTRAMUSCULAR | Status: DC | PRN
  Administered 2011-08-03 – 2011-10-20 (×2): 2 mg via INTRAMUSCULAR

## 2011-08-03 NOTE — Patient Instructions (Signed)
It was good to see you again I have set up a treatment plan in place for you to receive the same cocktail that we gave you in clinic today up to 2 times a week so to avoid you having to go to the hospital as frequently for your gastroparesis and cyclic vomiting flares.  I will also give you a rx for syringes for you to be able to inject phenergan at home as needed Try to avoid any marijuana exposure.  If you have any worsening in your symptoms give [place] a call or come to clinic during daytime hours.  If you have any questions, give [place] a call. God Bless,  Shanda Howells MD

## 2011-08-03 NOTE — Progress Notes (Signed)
  Subjective:    Patient ID: [name] [name], female    DOB: 1966-07-22, 45 y.o.   MRN: [id number]  HPI Pt comes in today with typical cyclic vomiting/gastro paresis flare. Pt woke up this am with intractable nausea and vomiting. Was not able to tolerate any po anti-emetic medication. Has a script for IM phenergan, but does not have syringes for this. Also has script for rectal phenergan suppository. Pt states that she does not tolerate this well. No fevers, recent sick contacts. Blood sugars have been ranging in 100s to 170s. Pt does not desire to go into hospital. Pt feels that she would benefit from usual cocktail of phenergan 25-50mg  IM, Morphine 2mg  IM, and ativan 1-2 mg IM. This has beneficial in breaking vomiting cycle per pt. No marijuana exposure per pt, as pt reports flares in vomiting with marijuana use.   Review of Systems See HPI    Objective:   Physical Exam Gen: in moderate distress secondary to pain, tearful  HEENT: NCAT, EOMI, mildly dry oral mucous membranes CV: RRR, no murmurs auscultated PULM: CTAB, no wheezes, rales, rhoncii ABD: S/+ bowel sounds/mild epigastric tenderness   EXT: 2+ peripheral pulses Assessment & Plan:

## 2011-08-03 NOTE — Assessment & Plan Note (Addendum)
Pt given 50mg  IM phenergan, 2mg  IM morphine, and 2 mg ativan IM in clinic. Pt symptomatically improved with this regimen and feels that she will be able to manage rest of flare at home. Pt has very good insight to disease. Discussed red flags for return.  Rx for syringes for phenergan injectons at home also given.  Discussed with pt plan for up to twice weekly nurse visits for similar cyclic vomiting flare regimen as to avoid hospital admissions. Pt feels that this is a good plan. Overall plan discussed with Dr. Wendy Poet and Elray Mcgregor, they also agree with this plan.  Standing orders for the aforementioned regimen are now in place in [facility].  Pt agreeable to plan.

## 2011-08-10 NOTE — H&P (Signed)
NAME:  [name], [name] NO.:  [id number]  MEDICAL RECORD NO.:  [id number]  LOCATION:  [facility]                         FACILITY:  [facility]  PHYSICIAN:  Talbert Cage, M.D.DATE OF BIRTH:  [birth year]  DATE OF ADMISSION:  07/19/2011 DATE OF DISCHARGE:                             HISTORY & PHYSICAL   PCP:  Marlana Salvage, MD  CHIEF COMPLAINT:  Chest pain.  HISTORY OF PRESENT ILLNESS:  [name] [name] is a 46 year old female presenting with chest pain that started about 1:30 p.m. today, no related with exertion.  The pain is located at the level of the mid sternum and epigastrium it is described as pressure of intensity 10/10 and  radiates to the left breast.  Pain is exacerbated with palpation and alleviates with morphine now.  Its intensity was 5/10 after opioid analgesic. The pain was not modified with 2 nitroglycerin given by EMS. It was associated  with nausea, no vomiting, and she felt clammy per her description.  Also, complaints of intermittent left arm muscular spasm for about a week.  EKG and cardiac enzymes x1 were negative, but we decided the need for close follow up and rule out cardiac event.  PAST MEDICAL HISTORY: 1. Diabetic neuropathy. 2. Diabetes mellitus. 3. Unspecified anemia. 4. Depression. 5. Anxiety. 6. Tobacco dependence. 7. Migraine, unspecified. 8. Hypertension. 9. GERD. 10.Persistent vomiting. 11.Pancreatitis. 12.Chronic back pain. 13.Insomnia. 14.Abdominal pain. 15.Muscle spasm.  PAST SURGICAL HISTORY:  None.  SOCIAL HISTORY:  She is single.  Denies alcohol abuse and she uses marijuana, last time used was last Saturday.  FAMILY HISTORY:  Mother died in heart attack at age 20.  ALLERGIES: ERYTHROMYCIN  nausea and vomiting,            OXYCODONE/ACETAMINOPHEN  itching.  MEDICATIONS: 1. Aspirin 81 mg. 2. Flexeril 5 mg. 3. Colace 100 mg b.i.d. p.r.n. 4. Hydrocodone/acetaminophen 5/500 mg tablets q.8 hours p.r.n. pain. 5.  Insulin aspart 100 units per mL, 6 units t.i.d. before meals. 6. Lantus 100 units 5 mL injection 22 units daily. 7. Lisinopril 2.5 mg 1 tablet daily. 8. Lorazepam/Ativan 1 mg 1 tablet p.o. q.6 hours p.r.n. 9. Metformin 1 tablet 1000 mg b.i.d. with meal. 10.Mirtazapine 1 tablet of 30 mg total at bedtime. 11.Omeprazole 1 capsule 20 mg p.o. daily. 12.MiraLax 1 packet daily as needed. 13.Promethazine 25 mg rectally q.6 hours p.r.n. or the same     dosification in tablet p.o. p.r.n. nausea. 14.Carafate which is sucralfate 1 mg p.o. q.6 hours with meals at     bedtime.  REVIEW OF SYSTEMS:  Please refer to HPI  PHYSICAL EXAMINATION:  VITAL SIGNS:  Pulse 68, blood pressure 160/80, heart rate 19, oxygen saturation 100 on room air, and temperature 98. GENERAL:  Alert, cooperative, and appears stated age. HEENT:  PERRL, sclera clear, anicteric, oropharynx clear, no lesions. NECK: Supple with midline trachea. HEART:  S1, S2 normal.  No murmur, rub or gallop.  Regular rate and rhythm. LUNGS:  Clear to auscultation.  No wheezes or rales, and unlabored breathing. ABDOMEN:  Abdomen is soft without significant tenderness, masses, organomegaly, or guarding. EXTREMITIES:  Normal atraumatic, no cyanosis or edema. SKIN:  No rashes, no petechiae. NEUROLOGY:  Normal without focal findings and mental status, speech normal, alert and oriented x3.  LABORATORY DATA:  Sodium 137, potassium 4.2, chloride 107, bicarb 24, BUN 13, creatinine 1.69.  White blood count 4.9, hemoglobin 9.4, hematocrit 27.5, platelets 194.  ASSESSMENT AND PLAN:  [name] [name] is a 45 year old female presenting with chest pain. 1. Rule out myocardial infarction.  The patient with atypical pain,     not related to exertion that is reproducible with palpation and does not     elevate with nitroglycerin.  In differential, we include GI origin     (GERD) and musculoskeletal (costochondritis).  We need to rule out     cardiovascular  origin due to the patient's risk factors basically     hypertension, diabetes, maternal history of heart atack. We will cycle     cardiac enzymes, EKG and a chest x-ray. 2. Hypertension, systolic mildly elevated.  She is on lisinopril.  We     will monitor and adjust antihypertensive treatment if needed. 3. Diabetes mellitus.  The patient on sliding scale insulin,     sensitive, we will monitor blood glucose and CBGs. 4. Chronic anemia.  The patient on 9.4 normocytic with MCV of 90.5.     We will check anemia panel in the morning. 5. Creatinine of 1.69, and the patient does not seems to be dehydrated, last meal was at     lunchtime and at noon today, so we will restart her p.o. intake and     evaluate her creatinine in the morning. 5. Fluids, electrolytes, and nutrition.  Heart healthy diet and     carbohydrate sensitive. 6. Prophylaxis with heparin subcu.  DISPOSITION:  Pending the patient improvement.    ______________________________ Westley Hummer, MD   ______________________________ Talbert Cage, M.D.    DP/MEDQ  D:  07/20/2011  T:  07/20/2011  Job:  [id number] Electronically Signed by Karma Lew PAZ  on 07/27/2011 10:00:45 PM Electronically Signed by Talbert Cage M.D. on 08/10/2011 11:47:32 AM

## 2011-08-10 NOTE — Discharge Summary (Signed)
NAME:  [name] [name], [name] [name]               ACCOUNT NO.:  [id number]  MEDICAL RECORD NO.:  [id number]  LOCATION:  [facility]                         FACILITY:  [facility]  PHYSICIAN:  Talbert Cage, M.D.DATE OF BIRTH:  [birth year]  DATE OF ADMISSION:  07/19/2011 DATE OF DISCHARGE:  07/21/2011                              DISCHARGE SUMMARY   DISCHARGE DIAGNOSES: 1. Chest pain. 2. Gastroesophageal reflux disease. 3. Diabetes mellitus. 4. Hypertension. 5. Persistent vomiting. 6. Tobacco/substance abuser. 7. Chronic back pain.  DISCHARGE MEDICATIONS: 1. Aspirin 81 mg 1 tablet p.o. daily. 2. Lorazepam 1 mg 1 tablet p.o. q. 6 h. p.r.n. 3. Benadryl 1 tablet p.o. daily p.r.n. 4. Carvedilol with Coreg 25 mg 1 tablet p.o. b.i.d. 5. Flexeril 10 mg 1 tablet p.o. t.i.d. p.r.n. for muscle spasms. 6. Hydrocodone/acetaminophen 5/325 mg 1-2 tablets p.o. q. 4 h. p.r.n.     pain. 7. Insulin aspart 60 units subcu t.i.d. with meals. 8. Lantus 22 units subcu daily at bedtime. 9. Lisinopril 5 mg half a tablet p.o. daily. 10.Mirtazapine 30 mg 1 tablet p.o. daily at bedtime. 11.Promethazine 25 mg 1 tablet p.o. q. 4 h. p.r.n. 12.Omeprazole 20 mg 1 capsule p.o. b.i.d. 13.Sucralfate which is Carafate 1 mg in 10 mL take 10 mL p.o. t.i.d.     before meals and an extra dose at bedtime.  CONSULTS:  None.  LABORATORY DATA:  Cardiac enzymes x3 negative.  Glucose 157, A1c is 7.6, creatinine 1.6.  Hemoglobin 10.8, white count in 4.2.  Urine drug analysis positive for tetrahydrocannabinol.  PERTINENT STUDIES:  Chest x-ray performed in July 19, 2011, and another one July 20, 2011, with no acute disease.  Port-A-Cath inserted since August. 12, 2006, in upper SVC.  BRIEF HOSPITAL COURSE:  This is a 45 year old female with a history of diabetes mellitus, hypertension, tobacco and drug use that is admitted for chest pain. 1. Chest pain.  On emergency department, after the patient was moved     to the floor, the pain was  no longer present.  Cardiac enzymes were     negative.  Electrocardiogram was without acute changes.     Cardiovascular consults were ruled out.  The patient had Lidoderm     patch as her pain was reproducible with palpation. 2. GERD.  This can also contribute to the etiology of her chest pain.     The patient on PPI high dose. 3. Diabetes mellitus on sliding-scale insulin, while hospitalized A1c     of 7.6.  This can be managed as an outpatient.  The patient to     continue her home insulin regimen. 4. Hypertension, stable on hospitalization.  The patient on Coreg and     lisinopril. 5. Recurrent vomiting.  This was the cause of keeping her an extra day     in the hospital since she started with nausea, no vomiting at this     time.  Treated with Phenergan, Reglan, and Zofran and resolved     within 24 hours. 6. Creatinine was mildly elevated but at her baseline.  Recommended     followup as outpatient. 7. Her other medical conditions were stable this hospitalization.  DISCHARGE INSTRUCTIONS: 1. Return  to her daily activities. 2. Heart-healthy and carbohydrate-modified diet. 3. Appointment is with Dr. Charlett Blake on Friday, August 23, 2011, at     9:45. 4. The patient is discharged home on stable medical condition.    ______________________________ Westley Hummer, MD   ______________________________ Talbert Cage, M.D.    DP/MEDQ  D:  07/25/2011  T:  07/26/2011  Job:  [id number]  Electronically Signed by Karma Lew PAZ  on 07/29/2011 11:46:31 PM Electronically Signed by Talbert Cage M.D. on 08/10/2011 11:47:25 AM

## 2011-08-11 ENCOUNTER — Ambulatory Visit (INDEPENDENT_AMBULATORY_CARE_PROVIDER_SITE_OTHER): Payer: Medicare Other | Admitting: Family Medicine

## 2011-08-11 ENCOUNTER — Encounter: Payer: Self-pay | Admitting: Family Medicine

## 2011-08-11 DIAGNOSIS — F411 Generalized anxiety disorder: Secondary | ICD-10-CM

## 2011-08-11 DIAGNOSIS — R1115 Cyclical vomiting syndrome unrelated to migraine: Secondary | ICD-10-CM

## 2011-08-11 MED ORDER — LORAZEPAM 2 MG/ML IJ SOLN
2.0000 mg | Freq: Once | INTRAMUSCULAR | Status: AC
  Administered 2011-08-11: 2 mg via INTRAMUSCULAR

## 2011-08-11 MED ORDER — PROMETHAZINE HCL 25 MG/ML IJ SOLN
25.0000 mg | Freq: Once | INTRAMUSCULAR | Status: AC
  Administered 2011-08-11: 25 mg via INTRAMUSCULAR

## 2011-08-11 MED ORDER — OXYCODONE HCL 5 MG PO TABS: 5.0000 mg | ORAL_TABLET | Freq: Four times a day (QID) | ORAL | Status: DC | PRN

## 2011-08-11 NOTE — Patient Instructions (Signed)
I will see you within a month to recheck  We can refill your medications then

## 2011-08-11 NOTE — Assessment & Plan Note (Addendum)
Mild flare today.  Will try phenergan ativan in clinic.  Reviewed plan with her.  Switched vicodin to oxycodine IR today.  Pt to get rid of any vicodin she has

## 2011-08-11 NOTE — Progress Notes (Signed)
  Subjective:    Patient ID: [name] [name], female    DOB: 1966-07-30, 45 y.o.   MRN: [id number]  HPI  Pt here today after a mild flare Monday.  She still has stomach pain and nausea.  Her son is getting out of jail (12 years) tomorrow. Also, she is fighting with her daughter who wont bring over the grandchild.  She is tearful today.  She thinks meds today in clinic might help  She would like to go back to work.  She thinks this will help her anxiety  Review of Systems Denies CP, SOB, HA, fever     Objective:   Physical Exam  Vital signs reviewed General appearance - alert, mild distress and oriented to person, place, and time  Tearful but no SI/Hi.    Heart - normal rate, regular rhythm, normal S1, S2, no murmurs, rubs, clicks or gallops Chest - clear to auscultation, no wheezes, rales or rhonchi, symmetric air entry, no tachypnea, retractions or cyanosis Abdomen - soft, moderate diffuse tenderness, nondistended, no masses or organomegaly      Assessment & Plan:  ANXIETY Anxiety relating to family stress making her have a mild flare.  Discussed support system, use of meds, use of this clinic for help.  Pt thinking of going back to work which i think would be good.  Cyclic vomiting syndrome Mild flare today.  Will try phenergan ativan in clinic.  Reviewed plan with her

## 2011-08-11 NOTE — Assessment & Plan Note (Signed)
Anxiety relating to family stress making her have a mild flare.  Discussed support system, use of meds, use of this clinic for help.  Pt thinking of going back to work which i think would be good.

## 2011-08-14 ENCOUNTER — Emergency Department (HOSPITAL_COMMUNITY)
Admission: EM | Admit: 2011-08-14 | Discharge: 2011-08-14 | Disposition: A | Discharge status: Home / Self Care | Payer: Medicare Other | Attending: Emergency Medicine | Admitting: Emergency Medicine

## 2011-08-14 ENCOUNTER — Emergency Department (HOSPITAL_COMMUNITY): Payer: Medicare Other

## 2011-08-14 DIAGNOSIS — R112 Nausea with vomiting, unspecified: Secondary | ICD-10-CM | POA: Insufficient documentation

## 2011-08-14 DIAGNOSIS — K219 Gastro-esophageal reflux disease without esophagitis: Secondary | ICD-10-CM | POA: Insufficient documentation

## 2011-08-14 DIAGNOSIS — E119 Type 2 diabetes mellitus without complications: Secondary | ICD-10-CM | POA: Insufficient documentation

## 2011-08-14 DIAGNOSIS — Z794 Long term (current) use of insulin: Secondary | ICD-10-CM | POA: Insufficient documentation

## 2011-08-14 DIAGNOSIS — R109 Unspecified abdominal pain: Secondary | ICD-10-CM | POA: Insufficient documentation

## 2011-08-14 DIAGNOSIS — I509 Heart failure, unspecified: Secondary | ICD-10-CM | POA: Insufficient documentation

## 2011-08-14 LAB — COMPREHENSIVE METABOLIC PANEL
ALT: 9 U/L (ref 0–35)
AST: 11 U/L (ref 0–37)
Alkaline Phosphatase: 79 U/L (ref 39–117)
CO2: 27 mEq/L (ref 19–32)
Calcium: 10.4 mg/dL (ref 8.4–10.5)
Chloride: 104 mEq/L (ref 96–112)
GFR calc Af Amer: 43 mL/min — ABNORMAL LOW (ref >60)
GFR calc non Af Amer: 35 mL/min — ABNORMAL LOW (ref >60)
Glucose, Bld: 275 mg/dL — ABNORMAL HIGH (ref 70–99)
Sodium: 141 mEq/L (ref 135–145)
Total Bilirubin: 0.4 mg/dL (ref 0.3–1.2)

## 2011-08-14 LAB — DIFFERENTIAL
Eosinophils Absolute: 0.2 10*3/uL (ref 0.0–0.7)
Lymphs Abs: 2.5 10*3/uL (ref 0.7–4.0)
Monocytes Relative: 6 % (ref 3–12)
Neutro Abs: 4.6 10*3/uL (ref 1.7–7.7)
Neutrophils Relative %: 59 % (ref 43–77)

## 2011-08-14 LAB — CBC
Hemoglobin: 11.6 g/dL — ABNORMAL LOW (ref 12.0–15.0)
MCH: 31.1 pg (ref 26.0–34.0)
MCV: 88.2 fL (ref 78.0–100.0)
RBC: 3.73 MIL/uL — ABNORMAL LOW (ref 3.87–5.11)

## 2011-08-16 ENCOUNTER — Ambulatory Visit: Payer: Medicare Other

## 2011-08-16 ENCOUNTER — Inpatient Hospital Stay (HOSPITAL_COMMUNITY)
Admission: AD | Admit: 2011-08-16 | Discharge: 2011-08-19 | DRG: 392 | Disposition: A | Discharge status: Home / Self Care | Payer: Medicare Other | Source: Ambulatory Visit | Attending: Family Medicine | Admitting: Family Medicine

## 2011-08-16 ENCOUNTER — Ambulatory Visit (INDEPENDENT_AMBULATORY_CARE_PROVIDER_SITE_OTHER): Payer: Medicare Other | Admitting: Family Medicine

## 2011-08-16 DIAGNOSIS — Z79899 Other long term (current) drug therapy: Secondary | ICD-10-CM

## 2011-08-16 DIAGNOSIS — E119 Type 2 diabetes mellitus without complications: Secondary | ICD-10-CM | POA: Diagnosis present

## 2011-08-16 DIAGNOSIS — R109 Unspecified abdominal pain: Secondary | ICD-10-CM

## 2011-08-16 DIAGNOSIS — R1032 Left lower quadrant pain: Secondary | ICD-10-CM

## 2011-08-16 DIAGNOSIS — M549 Dorsalgia, unspecified: Secondary | ICD-10-CM | POA: Diagnosis present

## 2011-08-16 DIAGNOSIS — Z794 Long term (current) use of insulin: Secondary | ICD-10-CM

## 2011-08-16 DIAGNOSIS — F17200 Nicotine dependence, unspecified, uncomplicated: Secondary | ICD-10-CM | POA: Diagnosis present

## 2011-08-16 DIAGNOSIS — Z7982 Long term (current) use of aspirin: Secondary | ICD-10-CM

## 2011-08-16 DIAGNOSIS — R1110 Vomiting, unspecified: Secondary | ICD-10-CM

## 2011-08-16 DIAGNOSIS — I10 Essential (primary) hypertension: Secondary | ICD-10-CM | POA: Diagnosis present

## 2011-08-16 DIAGNOSIS — K219 Gastro-esophageal reflux disease without esophagitis: Secondary | ICD-10-CM | POA: Diagnosis present

## 2011-08-16 DIAGNOSIS — F3289 Other specified depressive episodes: Secondary | ICD-10-CM | POA: Diagnosis present

## 2011-08-16 DIAGNOSIS — F329 Major depressive disorder, single episode, unspecified: Secondary | ICD-10-CM | POA: Diagnosis present

## 2011-08-16 DIAGNOSIS — G8929 Other chronic pain: Secondary | ICD-10-CM | POA: Diagnosis present

## 2011-08-16 DIAGNOSIS — R1115 Cyclical vomiting syndrome unrelated to migraine: Principal | ICD-10-CM | POA: Diagnosis present

## 2011-08-16 LAB — COMPREHENSIVE METABOLIC PANEL
ALT: 8 U/L (ref 0–35)
AST: 13 U/L (ref 0–37)
CO2: 28 mEq/L (ref 19–32)
Calcium: 10 mg/dL (ref 8.4–10.5)
Creatinine, Ser: 1.63 mg/dL — ABNORMAL HIGH (ref 0.50–1.10)
GFR calc non Af Amer: 34 mL/min — ABNORMAL LOW (ref >60)
Sodium: 141 mEq/L (ref 135–145)
Total Protein: 8.1 g/dL (ref 6.0–8.3)

## 2011-08-16 LAB — CBC
MCH: 31.6 pg (ref 26.0–34.0)
MCHC: 35.9 g/dL (ref 30.0–36.0)
MCV: 88.2 fL (ref 78.0–100.0)
Platelets: 236 10*3/uL (ref 150–400)
RBC: 3.89 MIL/uL (ref 3.87–5.11)
RDW: 13 % (ref 11.5–15.5)

## 2011-08-16 LAB — URINALYSIS, ROUTINE W REFLEX MICROSCOPIC
Bilirubin Urine: NEGATIVE
Nitrite: NEGATIVE
Protein, ur: 100 mg/dL — AB
Specific Gravity, Urine: 1.014 (ref 1.005–1.030)
Urobilinogen, UA: 1 mg/dL (ref 0.0–1.0)

## 2011-08-16 LAB — GLUCOSE, CAPILLARY
Glucose-Capillary: 281 mg/dL — ABNORMAL HIGH (ref 70–99)
Glucose-Capillary: 277 mg/dL — ABNORMAL HIGH (ref 70–99)

## 2011-08-16 LAB — URINE MICROSCOPIC-ADD ON

## 2011-08-16 MED ORDER — LORAZEPAM 2 MG/ML IJ SOLN
2.0000 mg | Freq: Once | INTRAMUSCULAR | Status: AC
  Administered 2011-08-16: 2 mg via INTRAMUSCULAR

## 2011-08-16 MED ORDER — LORAZEPAM 2 MG/ML IJ SOLN
1.0000 mg | Freq: Once | INTRAMUSCULAR | Status: AC
  Administered 2011-08-16: 1 mg via INTRAMUSCULAR

## 2011-08-16 MED ORDER — PROMETHAZINE HCL 25 MG/ML IJ SOLN
25.0000 mg | Freq: Once | INTRAMUSCULAR | Status: AC
  Administered 2011-08-16: 25 mg via INTRAMUSCULAR

## 2011-08-16 MED ORDER — MORPHINE SULFATE 10 MG/ML IJ SOLN
4.0000 mg | Freq: Once | INTRAMUSCULAR | Status: AC
  Administered 2011-08-16: 4 mg via INTRAMUSCULAR

## 2011-08-16 MED ORDER — MORPHINE SULFATE 10 MG/ML IJ SOLN
2.0000 mg | Freq: Once | INTRAMUSCULAR | Status: AC
  Administered 2011-08-16: 2 mg via INTRAMUSCULAR

## 2011-08-16 MED ORDER — LORAZEPAM 2 MG/ML IJ SOLN: 1.0000 mg | Freq: Once | INTRAMUSCULAR | Status: DC

## 2011-08-16 NOTE — Progress Notes (Signed)
[facility] Admission History and Physical  Patient name: [name] [name] Medical record number: [id number] Date of birth: [birth year] Age: 45 y.o. Gender: female  Primary Care Provider: Marlana Salvage, MD  Chief Complaint: persistent vomiting History of Present Illness: [name] [name] is a 45 y.o. year old female presenting with persistent vomiting.  She has had this multiple times in the past.  Over the last week she has presented 2x for this and was able to go home after treatment. This episode started on Saturday morning.  She had family visit who may have given her a stomach virus earlier this week.  On Saturday her symptoms included abd pain, nausea, vomiting.  She has no change in her stools, she has no fever.  She went to ED for eval on Saturday and they gave her dilaudid as well as phenergan and ativan and sent her home.  She felt the same on Sunday but was able to take her home meds.  This morning she was unable to keep her meds down as she was vomiting more.  She denies blood in emesis or stool.  She denies HA, vision change.  Patient Active Problem List  Diagnoses  . NEUROPATHY, DIABETIC  . DIABETES MELLITUS, II, COMPLICATIONS  . UNSPECIFIED ANEMIA  . DEPRESSION, MAJOR, RECURRENT  . ANXIETY  . TOBACCO DEPENDENCE  . MIGRAINE, UNSPEC., W/O INTRACTABLE MIGRAINE  . HYPERTENSION, BENIGN SYSTEMIC  . GERD  . PERSISTENT VOMITING  . PANCREATITIS  . UNSPECIFIED VAGINITIS AND VULVOVAGINITIS  . VAGINAL PRURITUS  . CALLUSES, FEET, BILATERAL  . BACK PAIN, CHRONIC  . INSOMNIA NOS  . ABDOMINAL PAIN  . HIGH RISK PATIENT  . MUSCLE SPASM, TRAPEZIUS  . Hot flashes  . Cyclic vomiting syndrome   Past Medical History: No past medical history on file.  Past Surgical History: No past surgical history on file.  Social History: History   Social History  . Marital Status: Single    Spouse Name: N/A    Number of Children: N/A  . Years of Education: N/A    Social History Main Topics  . Smoking status: Current Some Day Smoker  . Smokeless tobacco: Never Used   Comment: 1 cig this week  . Alcohol Use: Not on file  . Drug Use: Not on file  . Sexually Active: Not on file   Other Topics Concern  . Not on file   Social History Narrative  . No narrative on file    Family History: No family history on file.  Allergies: Allergies  Allergen Reactions  . Erythromycin Nausea And Vomiting  . Oxycodone-Acetaminophen Itching    Current Outpatient Prescriptions  Medication Sig Dispense Refill  . aspirin 81 MG tablet Take 81 mg by mouth daily.        . carvedilol (COREG) 12.5 MG tablet Take 1 tablet (12.5 mg total) by mouth 2 (two) times daily with a meal.  60 tablet  11  . cyclobenzaprine (FLEXERIL) 5 MG tablet Take 2 tablets (10 mg total) by mouth 3 (three) times daily as needed.  21 tablet  4  . docusate sodium (COLACE) 100 MG capsule Take 1 capsule (100 mg total) by mouth 2 (two) times daily as needed.  30 capsule  11  . EPINEPHrine (EPIPEN) 0.3 MG/0.3ML DEVI One dose Im as needed for severe anaphylaxis.       Marland Kitchen insulin aspart (NOVOLOG) 100 UNIT/ML injection Inject 6 Units into the skin 3 (three) times daily before meals. Dispense  as Pen  3 mL  11  . insulin glargine (LANTUS) 100 UNIT/ML injection Inject 22 Units into the skin every morning. .  10 mL  11  . Insulin Syringe-Needle U-100 (B-D INS SYR ULTRAFINE .3CC/29G) 29G X 1/2" 0.3 ML MISC To be used for prn IM phenergan injections  100 each  11  . lidocaine (LIDODERM) 5 % Place 1 patch onto the skin every 12 (twelve) hours. Remove & Discard patch within 12 hours or as directed by MD  30 patch  11  . lisinopril (ZESTRIL) 2.5 MG tablet Take 1 tablet (2.5 mg total) by mouth daily.  30 tablet  11  . LORazepam (ATIVAN) 1 MG tablet Take 1 tablet (1 mg total) by mouth every 6 (six) hours as needed. For nausea.  30 tablet  3  . metFORMIN (GLUCOPHAGE) 1000 MG tablet Take 1 tablet (1,000 mg total)  by mouth 2 (two) times daily with a meal.  60 tablet  11  . mirtazapine (REMERON) 30 MG tablet Take 1 tablet (30 mg total) by mouth at bedtime.  30 tablet  11  . omeprazole (PRILOSEC) 20 MG capsule Take 1 capsule (20 mg total) by mouth daily.  30 capsule  11  . oxyCODONE (ROXICODONE) 5 MG immediate release tablet Take 1 tablet (5 mg total) by mouth every 6 (six) hours as needed for pain.  60 tablet  0  . polyethylene glycol powder (MIRALAX) powder Take 17 g by mouth daily. Mix packet with water once daily as needed for constipation.  255 g  11  . promethazine (PHENERGAN) 25 MG suppository Place 1 suppository (25 mg total) rectally every 6 (six) hours as needed.  12 each  11  . promethazine (PHENERGAN) 25 MG tablet Take 1 tablet (25 mg total) by mouth every 4 (four) hours. At first sign of nausea.   120 tablet  11  . promethazine (PHENERGAN) 25 MG/ML injection Inject 1 mL (25 mg total) into the muscle every 6 (six) hours as needed.  10 mL  11  . PROMETHAZINE HCL IM Inject 25 mg (62mL) IM every 6 hours as needed.       . sucralfate (CARAFATE) 1 G tablet Take 1 tablet (1 g total) by mouth 4 (four) times daily -  with meals and at bedtime.  120 tablet  11   Current Facility-Administered Medications  Medication Dose Route Frequency Provider Last Rate Last Dose  . LORazepam (ATIVAN) injection 1 mg  1 mg Intravenous Once Marlana Salvage      . LORazepam (ATIVAN) injection 2 mg  2 mg Intramuscular Q4H PRN Shanda Howells   2 mg at 08/03/11 1151  . LORazepam (ATIVAN) injection 2 mg  2 mg Intramuscular Once Marlana Salvage      . morphine injection 2 mg  2 mg Intramuscular Q4H PRN Shanda Howells   2 mg at 08/03/11 1152  . morphine injection 2 mg  2 mg Intramuscular Once Marlana Salvage      . morphine injection 4 mg  4 mg Intramuscular Once Marlana Salvage      . promethazine (PHENERGAN) injection 25 mg  25 mg Intramuscular Q6H PRN Shanda Howells   25 mg at 08/03/11 1154  . promethazine (PHENERGAN) injection 25 mg   25 mg Intramuscular Once [place]: Per HPI with the following additions: significant family stress, feelings of hopelessness.  Denies SI/Hi Otherwise 12 point review of systems was performed and  was unremarkable.  Physical Exam:            General: alert, cooperative and moderate distress HEENT: PERRLA and extra ocular movement intact Heart: S1, S2 normal, no murmur, rub or gallop, regular rate and rhythm Lungs: clear to auscultation, no wheezes or rales and unlabored breathing Abdomen: moderate tenderness in the entire abdomen., no rebound tenderness, no guarding or rigidity, no CVA tenderness Extremities: extremities normal, atraumatic, no cyanosis or edema Skin:no rashes Neurology: normal without focal findings, mental status, speech normal, alert and oriented x3, PERLA, muscle tone and strength normal and symmetric, sensation grossly normal and gait and station normal  Labs and Imaging: Lab Results  Component Value Date/Time   NA 141 08/14/2011  6:45 PM   K 3.7 08/14/2011  6:45 PM   CL 104 08/14/2011  6:45 PM   CO2 27 08/14/2011  6:45 PM   BUN 22 08/14/2011  6:45 PM   CREATININE 1.57* 08/14/2011  6:45 PM   GLUCOSE 275* 08/14/2011  6:45 PM   Lab Results  Component Value Date   WBC 7.8 08/14/2011   HGB 11.6* 08/14/2011   HCT 32.9* 08/14/2011   MCV 88.2 08/14/2011   PLT 226 08/14/2011      Assessment and Plan: MIMIE BARZEE is a 46 y.o. year old female presenting with episode of cyclic vomiting 1. Vomiting-  Will start on regimen of zofran and phenergan scheduled with morphine and ativan PRN.  It seems that reglan fell off of her list between her early June and later June admits.  I will restart that today.  She has had a recent gastric emptying study and this has been thoroughly evaluated.  I will check a u/a to make sure that she does not have a UTI complicating since she is tender over the bladder. 2.   DMII- will start SSI, hold metformin and lantus,  not eating and elevated Cr.  She states she was not taking metformin at home.  A1c up to date.  3.   HTN- continue current regimen of home meds. 4.   Depression- continue remeron.  Consider switch to a SSRI due to continued feelings of hopelessness and irritability as well as trouble with hot flashes. 5.   HL- continue pravastatin 6. FEN/GI: D5 1/2 NS at 125 ml/hr.  Clears advance as tolerated  7. Prophylaxis: heparin 5000 units TID, continue PPI 8. Disposition: pending clinical improvement.

## 2011-08-17 DIAGNOSIS — R112 Nausea with vomiting, unspecified: Secondary | ICD-10-CM

## 2011-08-17 DIAGNOSIS — E860 Dehydration: Secondary | ICD-10-CM

## 2011-08-17 LAB — CBC
Hemoglobin: 9.9 g/dL — ABNORMAL LOW (ref 12.0–15.0)
Platelets: 194 10*3/uL (ref 150–400)
RBC: 3.16 MIL/uL — ABNORMAL LOW (ref 3.87–5.11)
WBC: 5.9 10*3/uL (ref 4.0–10.5)

## 2011-08-17 LAB — BASIC METABOLIC PANEL
CO2: 28 mEq/L (ref 19–32)
Chloride: 103 mEq/L (ref 96–112)
Glucose, Bld: 229 mg/dL — ABNORMAL HIGH (ref 70–99)
Sodium: 138 mEq/L (ref 135–145)

## 2011-08-17 LAB — URINE CULTURE
Culture  Setup Time: 201208201707
Special Requests: NEGATIVE

## 2011-08-17 LAB — GLUCOSE, CAPILLARY: Glucose-Capillary: 129 mg/dL — ABNORMAL HIGH (ref 70–99)

## 2011-08-18 LAB — CBC
HCT: 26.9 % — ABNORMAL LOW (ref 36.0–46.0)
Hemoglobin: 9.4 g/dL — ABNORMAL LOW (ref 12.0–15.0)
RBC: 3.01 MIL/uL — ABNORMAL LOW (ref 3.87–5.11)
RDW: 13.1 % (ref 11.5–15.5)
WBC: 4.3 10*3/uL (ref 4.0–10.5)

## 2011-08-18 LAB — BASIC METABOLIC PANEL
BUN: 8 mg/dL (ref 6–23)
CO2: 25 mEq/L (ref 19–32)
Chloride: 105 mEq/L (ref 96–112)
GFR calc Af Amer: 45 mL/min — ABNORMAL LOW (ref >60)
Glucose, Bld: 226 mg/dL — ABNORMAL HIGH (ref 70–99)
Potassium: 4 mEq/L (ref 3.5–5.1)

## 2011-08-18 LAB — GLUCOSE, CAPILLARY
Glucose-Capillary: 220 mg/dL — ABNORMAL HIGH (ref 70–99)
Glucose-Capillary: 129 mg/dL — ABNORMAL HIGH (ref 70–99)
Glucose-Capillary: 206 mg/dL — ABNORMAL HIGH (ref 70–99)

## 2011-08-27 ENCOUNTER — Ambulatory Visit (INDEPENDENT_AMBULATORY_CARE_PROVIDER_SITE_OTHER): Payer: Medicare Other | Admitting: Family Medicine

## 2011-08-27 DIAGNOSIS — S99929A Unspecified injury of unspecified foot, initial encounter: Secondary | ICD-10-CM

## 2011-08-27 DIAGNOSIS — S8990XA Unspecified injury of unspecified lower leg, initial encounter: Secondary | ICD-10-CM

## 2011-08-27 NOTE — Assessment & Plan Note (Signed)
Split of skin due to injury vs fungal infection. Will treat with abx ointment and bandage x 1 week.  Keep dry.  See back in 2 weeks, RTC sooner if not healing.  Would treat with antifungal if no better

## 2011-08-27 NOTE — Progress Notes (Signed)
  Subjective:    Patient ID: [name] [name], female    DOB: [birth year], 45 y.o.   MRN: [id number]  HPI  Pt hit foot on bookshelf 2 days ago. No bleeding but painful.  Yesterday she looked at the bottom of her foot and noticed that her skin had split open on her 3rd toe on teh left foot.  She has been washing it but it is very painful.  It is not draining or bleeding.  No itch.  Review of Systems    denies fevers, chills Objective:   Physical Exam  Vital signs reviewed General appearance - alert, well appearing, and in no distress and oriented to person, place, and time Foot - 3rd toe on left foot with split of skin on the plantar surface.  No surrounding erythema.  The split is the width of the toe.      Assessment & Plan:  Toe injury Split of skin due to injury vs fungal infection. Will treat with abx ointment and bandage x 1 week.  Keep dry.  See back in 2 weeks, RTC sooner if not healing.  Would treat with antifungal if no better

## 2011-09-07 ENCOUNTER — Encounter: Payer: Self-pay | Admitting: Family Medicine

## 2011-09-07 ENCOUNTER — Ambulatory Visit (INDEPENDENT_AMBULATORY_CARE_PROVIDER_SITE_OTHER): Payer: Medicare Other | Admitting: Family Medicine

## 2011-09-07 ENCOUNTER — Inpatient Hospital Stay (HOSPITAL_COMMUNITY)
Admission: AD | Admit: 2011-09-07 | Discharge: 2011-09-09 | DRG: 392 | Disposition: A | Discharge status: Home / Self Care | Payer: Medicare Other | Source: Ambulatory Visit | Attending: Family Medicine | Admitting: Family Medicine

## 2011-09-07 VITALS — BP 189/114 | HR 97 | Ht 61.0 in | Wt 168.0 lb

## 2011-09-07 DIAGNOSIS — N189 Chronic kidney disease, unspecified: Secondary | ICD-10-CM | POA: Diagnosis present

## 2011-09-07 DIAGNOSIS — R112 Nausea with vomiting, unspecified: Secondary | ICD-10-CM

## 2011-09-07 DIAGNOSIS — E860 Dehydration: Secondary | ICD-10-CM

## 2011-09-07 DIAGNOSIS — E119 Type 2 diabetes mellitus without complications: Secondary | ICD-10-CM | POA: Diagnosis present

## 2011-09-07 DIAGNOSIS — F329 Major depressive disorder, single episode, unspecified: Secondary | ICD-10-CM | POA: Diagnosis present

## 2011-09-07 DIAGNOSIS — R1115 Cyclical vomiting syndrome unrelated to migraine: Secondary | ICD-10-CM

## 2011-09-07 DIAGNOSIS — F3289 Other specified depressive episodes: Secondary | ICD-10-CM | POA: Diagnosis present

## 2011-09-07 DIAGNOSIS — I129 Hypertensive chronic kidney disease with stage 1 through stage 4 chronic kidney disease, or unspecified chronic kidney disease: Secondary | ICD-10-CM | POA: Diagnosis present

## 2011-09-07 LAB — COMPREHENSIVE METABOLIC PANEL
Albumin: 3.9 g/dL (ref 3.5–5.2)
Alkaline Phosphatase: 83 U/L (ref 39–117)
BUN: 33 mg/dL — ABNORMAL HIGH (ref 6–23)
Chloride: 108 mEq/L (ref 96–112)
Creatinine, Ser: 1.57 mg/dL — ABNORMAL HIGH (ref 0.50–1.10)
GFR calc Af Amer: 43 mL/min — ABNORMAL LOW (ref >60)
GFR calc non Af Amer: 35 mL/min — ABNORMAL LOW (ref >60)
Glucose, Bld: 229 mg/dL — ABNORMAL HIGH (ref 70–99)
Potassium: 4.1 mEq/L (ref 3.5–5.1)
Total Bilirubin: 0.3 mg/dL (ref 0.3–1.2)

## 2011-09-07 LAB — GLUCOSE, CAPILLARY: Glucose-Capillary: 269 mg/dL — ABNORMAL HIGH (ref 70–99)

## 2011-09-07 LAB — CBC
HCT: 33.2 % — ABNORMAL LOW (ref 36.0–46.0)
MCH: 31.6 pg (ref 26.0–34.0)
MCV: 88.1 fL (ref 78.0–100.0)
Platelets: 255 10*3/uL (ref 150–400)
RBC: 3.77 MIL/uL — ABNORMAL LOW (ref 3.87–5.11)
RDW: 13.1 % (ref 11.5–15.5)

## 2011-09-07 LAB — LIPASE, BLOOD: Lipase: 99 U/L — ABNORMAL HIGH (ref 11–59)

## 2011-09-07 MED ORDER — LORAZEPAM 2 MG/ML IJ SOLN
2.0000 mg | Freq: Once | INTRAMUSCULAR | Status: AC
  Administered 2011-09-07: 2 mg via INTRAVENOUS

## 2011-09-07 MED ORDER — PROMETHAZINE HCL 25 MG/ML IJ SOLN
25.0000 mg | Freq: Four times a day (QID) | INTRAMUSCULAR | Status: DC | PRN
  Administered 2011-09-07: 25 mg via INTRAMUSCULAR

## 2011-09-07 MED ORDER — MORPHINE SULFATE 4 MG/ML IJ SOLN
4.0000 mg | Freq: Once | INTRAMUSCULAR | Status: AC
  Administered 2011-09-07: 4 mg via INTRAMUSCULAR

## 2011-09-07 NOTE — Progress Notes (Signed)
[facility] Admission History and Physical  Patient name: [name] [name] Medical record number: [id number] Date of birth: 10/11/1966 Age: 45 y.o. Gender: female  Primary Care Provider: Marlana Salvage, MD  Chief Complaint: persistent vomiting History of Present Illness: [name] [name] is a 45 y.o. year old female presenting with persistent vomiting.  She has had this multiple times in the past. This episode started this morning at 4:30 am with vomitting.  Her trigger was eating at a steak and cheese restarant last night that was new.  She felt fine until this AM when she started vomitting.  She then had severe abdominal pain consistent with her usual symptoms. She tried an injection of phenergan at home that didn't help.  She denies blood in emesis or stool.  She denies HA, vision change.  Patient Active Problem List  Diagnoses  . NEUROPATHY, DIABETIC  . DIABETES MELLITUS, II, COMPLICATIONS  . UNSPECIFIED ANEMIA  . DEPRESSION, MAJOR, RECURRENT  . ANXIETY  . TOBACCO DEPENDENCE  . MIGRAINE, UNSPEC., W/O INTRACTABLE MIGRAINE  . HYPERTENSION, BENIGN SYSTEMIC  . GERD  . PERSISTENT VOMITING  . PANCREATITIS  . UNSPECIFIED VAGINITIS AND VULVOVAGINITIS  . VAGINAL PRURITUS  . CALLUSES, FEET, BILATERAL  . BACK PAIN, CHRONIC  . INSOMNIA NOS  . ABDOMINAL PAIN  . HIGH RISK PATIENT  . MUSCLE SPASM, TRAPEZIUS  . Hot flashes  . Cyclic vomiting syndrome  . Toe injury   Past Medical History: Cyclic vomitting, DM, migraine, depression  Past Surgical History: noncontributory  Social History: Has three children, supportive family.  Occasional marijuana use, no alcohol.   Family History: noncontributory  Allergies: Allergies  Allergen Reactions  . Erythromycin Nausea And Vomiting  . Oxycodone-Acetaminophen Itching    Current outpatient prescriptions:aspirin 81 MG tablet, Take 81 mg by mouth daily.  , Disp: , Rfl: ;  carvedilol (COREG) 12.5 MG tablet, Take  1 tablet (12.5 mg total) by mouth 2 (two) times daily with a meal., Disp: 60 tablet, Rfl: 11;  cyclobenzaprine (FLEXERIL) 5 MG tablet, Take 2 tablets (10 mg total) by mouth 3 (three) times daily as needed., Disp: 21 tablet, Rfl: 4 docusate sodium (COLACE) 100 MG capsule, Take 1 capsule (100 mg total) by mouth 2 (two) times daily as needed., Disp: 30 capsule, Rfl: 11;  EPINEPHrine (EPIPEN) 0.3 MG/0.3ML DEVI, One dose Im as needed for severe anaphylaxis. , Disp: , Rfl: ;  insulin aspart (NOVOLOG) 100 UNIT/ML injection, Inject 6 Units into the skin 3 (three) times daily before meals. Dispense as Pen, Disp: 3 mL, Rfl: 11 insulin glargine (LANTUS) 100 UNIT/ML injection, Inject 22 Units into the skin every morning. ., Disp: 10 mL, Rfl: 11;  Insulin Syringe-Needle U-100 (B-D INS SYR ULTRAFINE .3CC/29G) 29G X 1/2" 0.3 ML MISC, To be used for prn IM phenergan injections, Disp: 100 each, Rfl: 11;  lidocaine (LIDODERM) 5 %, Place 1 patch onto the skin every 12 (twelve) hours. Remove & Discard patch within 12 hours or as directed by MD, Disp: 30 patch, Rfl: 11 lisinopril (ZESTRIL) 2.5 MG tablet, Take 1 tablet (2.5 mg total) by mouth daily., Disp: 30 tablet, Rfl: 11;  LORazepam (ATIVAN) 1 MG tablet, Take 1 tablet (1 mg total) by mouth every 6 (six) hours as needed. For nausea., Disp: 30 tablet, Rfl: 3;  metFORMIN (GLUCOPHAGE) 1000 MG tablet, Take 1 tablet (1,000 mg total) by mouth 2 (two) times daily with a meal., Disp: 60 tablet, Rfl: 11 mirtazapine (REMERON) 30 MG tablet, Take 1  tablet (30 mg total) by mouth at bedtime., Disp: 30 tablet, Rfl: 11;  omeprazole (PRILOSEC) 20 MG capsule, Take 1 capsule (20 mg total) by mouth daily., Disp: 30 capsule, Rfl: 11;  polyethylene glycol powder (MIRALAX) powder, Take 17 g by mouth daily. Mix packet with water once daily as needed for constipation., Disp: 255 g, Rfl: 11 promethazine (PHENERGAN) 25 MG suppository, Place 1 suppository (25 mg total) rectally every 6 (six) hours as  needed., Disp: 12 each, Rfl: 11;  promethazine (PHENERGAN) 25 MG tablet, Take 1 tablet (25 mg total) by mouth every 4 (four) hours. At first sign of nausea. , Disp: 120 tablet, Rfl: 11;  promethazine (PHENERGAN) 25 MG/ML injection, Inject 1 mL (25 mg total) into the muscle every 6 (six) hours as needed., Disp: 10 mL, Rfl: 11 PROMETHAZINE HCL IM, Inject 25 mg (42mL) IM every 6 hours as needed. , Disp: , Rfl: ;  sucralfate (CARAFATE) 1 G tablet, Take 1 tablet (1 g total) by mouth 4 (four) times daily -  with meals and at bedtime., Disp: 120 tablet, Rfl: 11 Current facility-administered medications:LORazepam (ATIVAN) injection 2 mg, 2 mg, Intramuscular, Q4H PRN, Shanda Howells, 2 mg at 08/03/11 1151;  morphine injection 2 mg, 2 mg, Intramuscular, Q4H PRN, Shanda Howells, 2 mg at 08/03/11 1152;  promethazine (PHENERGAN) injection 25 mg, 25 mg, Intramuscular, Q6H PRN, Shanda Howells, 25 mg at 08/03/11 1154  Review Of Systems:   12 point review of systems was performed and was unremarkable.  Physical Exam:            General: alert, cooperative and moderate distress HEENT: PERRLA and extra ocular movement intact Heart: S1, S2 normal, no murmur, rub or gallop, regular rate and rhythm Lungs: clear to auscultation, no wheezes or rales and unlabored breathing Abdomen: moderate tenderness in the entire abdomen.worse in epigastric area, no rebound tenderness, no guarding or rigidity, no CVA tenderness Extremities: extremities normal, atraumatic, no cyanosis or edema Skin:no rashes Neurology: normal without focal findings, mental status, speech normal, alert and oriented x3, PERLA, muscle tone and strength normal and symmetric, sensation grossly normal and gait and station normal  Labs and Imaging: none  Assessment and Plan: [name] [name] is a 45 y.o. year old female presenting with episode of cyclic vomiting 1. Vomiting-  Will start on regimen of zofran and phenergan scheduled with morphine and ativan.   Will start clear diet and advance as tolerated.  2.   DMII- will start SSI, hold metformin and start lantus at 15 units, not eating.  A1c up to date.  3.   HTN- elevated due to pain, will continue current regimen of home meds, and monitor. 4.   Depression- continue remeron.  Consider switch to a SSRI due to continued feelings of hopelessness and irritability as well as trouble with hot flashes. 5.   HL- continue pravastatin 6. FEN/GI: D5 1/2 NS at 125 ml/hr.  Clears advance as tolerated  7. Prophylaxis: heparin 5000 units TID, continue PPI 8. Disposition: pending clinical improvement.

## 2011-09-07 NOTE — Progress Notes (Signed)
Addended by: Teddy Spike on: 09/07/2011 11:47 AM   Modules accepted: Orders

## 2011-09-07 NOTE — Progress Notes (Signed)
Addended by: Teddy Spike on: 09/07/2011 12:06 PM   Modules accepted: Orders

## 2011-09-07 NOTE — Progress Notes (Signed)
Addended by: Teddy Spike on: 09/07/2011 12:41 PM   Modules accepted: Orders

## 2011-09-08 LAB — GLUCOSE, CAPILLARY
Glucose-Capillary: 212 mg/dL — ABNORMAL HIGH (ref 70–99)
Glucose-Capillary: 172 mg/dL — ABNORMAL HIGH (ref 70–99)

## 2011-09-08 LAB — CBC
Hemoglobin: 10.6 g/dL — ABNORMAL LOW (ref 12.0–15.0)
MCH: 31.5 pg (ref 26.0–34.0)
MCHC: 35.6 g/dL (ref 30.0–36.0)
Platelets: 219 10*3/uL (ref 150–400)
RDW: 13.2 % (ref 11.5–15.5)

## 2011-09-08 LAB — BASIC METABOLIC PANEL
Calcium: 8.8 mg/dL (ref 8.4–10.5)
GFR calc Af Amer: 46 mL/min — ABNORMAL LOW (ref >60)
GFR calc non Af Amer: 38 mL/min — ABNORMAL LOW (ref >60)
Glucose, Bld: 157 mg/dL — ABNORMAL HIGH (ref 70–99)
Potassium: 3.8 mEq/L (ref 3.5–5.1)
Sodium: 139 mEq/L (ref 135–145)

## 2011-09-08 LAB — LIPASE, BLOOD: Lipase: 44 U/L (ref 11–59)

## 2011-09-09 LAB — GLUCOSE, CAPILLARY: Glucose-Capillary: 125 mg/dL — ABNORMAL HIGH (ref 70–99)

## 2011-09-10 ENCOUNTER — Ambulatory Visit (INDEPENDENT_AMBULATORY_CARE_PROVIDER_SITE_OTHER): Payer: Medicare Other | Admitting: Family Medicine

## 2011-09-10 ENCOUNTER — Encounter: Payer: Self-pay | Admitting: Family Medicine

## 2011-09-10 VITALS — BP 136/77 | HR 80 | Temp 98.4°F | Wt 175.7 lb

## 2011-09-10 DIAGNOSIS — R1115 Cyclical vomiting syndrome unrelated to migraine: Secondary | ICD-10-CM

## 2011-09-10 MED ORDER — OXYCODONE HCL 5 MG PO TABS: 5.0000 mg | ORAL_TABLET | Freq: Four times a day (QID) | ORAL | Status: DC | PRN

## 2011-09-10 NOTE — Assessment & Plan Note (Signed)
Much better after 2 days in the hospital.  Continue current management.  See back in one month.  Gave 3 months of oxycodone for pain management at home to help prevent hospitalizations, which it does somewhat.  I think revisiting her antidepressant at next visit wouldhelp too.

## 2011-09-10 NOTE — Patient Instructions (Signed)
I want to see you back in one month if everything is going well, sooner if not  Call if you have any questions
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
## 2011-09-15 LAB — BASIC METABOLIC PANEL
CO2: 21
Calcium: 8.5
Chloride: 106
Chloride: 115 — ABNORMAL HIGH
Creatinine, Ser: 0.95
Creatinine, Ser: 1.12
GFR calc Af Amer: >60
GFR calc Af Amer: >60
Sodium: 138
Sodium: 139

## 2011-09-15 LAB — H. PYLORI ANTIBODY, IGG: H Pylori IgG: <0.4

## 2011-09-15 LAB — BILIRUBIN, DIRECT: Bilirubin, Direct: 0.1

## 2011-09-15 LAB — CBC
Hemoglobin: 8.9 — ABNORMAL LOW
MCV: 92.9
RBC: 2.76 — ABNORMAL LOW
WBC: 4.9

## 2011-09-15 LAB — LIPASE, BLOOD: Lipase: 20

## 2011-09-15 LAB — IRON AND TIBC: Iron: 65

## 2011-09-17 NOTE — Discharge Summary (Signed)
NAME:  [name] [name], [name] [name] NO.:  [id number]  MEDICAL RECORD NO.:  [id number]  LOCATION:                                 FACILITY:  PHYSICIAN:  Blane Ohara Montavius Subramaniam, M.D.DATE OF BIRTH:  [birth year]  DATE OF ADMISSION:  08/16/2011 DATE OF DISCHARGE:  08/19/2011                              DISCHARGE SUMMARY   ATTENDING PHYSICIAN:  Blane Ohara Sabrinna Yearwood, MD  PRIMARY CARE PHYSICIAN:  Dr. Charlett Blake at [facility].  REASON FOR HOSPITALIZATION:  Nausea and vomiting.  DISCHARGE DIAGNOSES: 1. Cyclic vomiting. 2. Hypertension. 3. Diabetes mellitus. 4. Gastroesophageal reflux disease. 5. Chronic back pain. 6. Tobacco and substance abuse.  DISCHARGE MEDICATIONS:  The patient was discharged home on her current home regimen.  There are no changes made to her home regimen which includes; 1. Aspirin 81 mg p.o. daily. 2. Carvedilol 12.5 mg p.o. b.i.d. 3. Cyclobenzaprine 5 mg p.o. t.i.d.4. Colace 100 mg p.o. b.i.d. 5. Lantus 22 units q.a.m. 6. Lidoderm patch 5% q. day. 7. Lisinopril 2.5 mg p.o. daily. 8. Lorazepam 1 mg p.o. q.6 h. 9. Omeprazole 20 mg p.o. daily. 10.Mirtazapine 30 mg nightly. 11.NovoLog 6 units subcu daily. 12.Oxycodone 5 mg p.o. q.6 h. 13.Promethazine rectal suppository 25 mg, PR q.6 h. as needed for     nausea and vomiting. 14.Promethazine 25 mg p.o. q.4 h. p.r.n. nausea and vomiting. 15.Sucralfate 1 g p.o. with meals and at bedtime when needed for     heartburn.  BRIEF HOSPITAL COURSE:  [name] [name] presented to the her primary care physician, Dr. Marlana Salvage, on August 20 with persistent vomiting that was characteristic of her cyclic vomiting from the past.  She was seen in the primary care office three times prior to admission including the day of admission.  She did have abdominal pain, nausea, and vomiting.  She was brought into the ED and started on pain control with IV Dilaudid and a nausea cocktail including IV Reglan, Phenergan,  and Ativan.  Her course improved throughout hospitalization and at the time of discharge, she was back to baseline with only minimal abdominal pain and no continued nausea and vomiting.  She was able to tolerate p.o. diet and was discharged home in stable condition.  Her tobacco use was discussed during this time and she is contemplating quitting cigarettes, this was a pertinent conversation due to questionable utility of oral contraceptive pills for her cyclic vomiting.  This has not been tried in the past and she is interested in using them and potentially could be a good candidate for them if she is able to quit smoking, this is a conversation that she is to have with Dr. Charlett Blake at followup.  THE PATIENT'S CONDITION AT THE TIME OF DISCHARGE:  Improved.  PENDING LAB TESTS:  None.  DISPOSITION:  She is to discontinue home with followup with her primary care provider.  FOLLOWUP APPOINTMENT:  With Dr. Charlett Blake on Friday, August 26, 2009, 2:15 p.m.  FOLLOWUP ISSUES:  She needs followup of her hypertension and diabetes. In addition to address her cyclic vomiting, she may be appropriate candidate for oral contraceptive pills at least as a trial.  The risks were discussed  with her especially in light of her advanced age with smoking regarding blood clots.  At this time, we have not start her on these, however, this is a conversation that could be had with Dr. Charlett Blake and as long as the patient is aware of all the risks may be an appropriate candidate for 85-month trial on oral contraceptive pills in spite of her continued smoking.    ______________________________ [name] Coombs, DO   ______________________________ Blane Ohara Pesach Frisch, M.D.    MR/MEDQ  D:  08/19/2011  T:  08/19/2011  Job:  [id number]  Electronically Signed by [name] [name]  on 09/15/2011 05:04:02 PM Electronically Signed by Lissa Morales M.D. on 09/17/2011 03:08:09 PM

## 2011-09-21 LAB — BASIC METABOLIC PANEL
BUN: 15
CO2: 21
Calcium: 8.6
Chloride: 110
Chloride: 114 — ABNORMAL HIGH
Creatinine, Ser: 0.94
Creatinine, Ser: 1.13
Creatinine, Ser: 1.14
GFR calc Af Amer: >60
GFR calc Af Amer: >60
GFR calc Af Amer: >60
GFR calc non Af Amer: 52 — ABNORMAL LOW
Sodium: 138
Sodium: 139

## 2011-09-21 LAB — CBC
HCT: 39.3
Hemoglobin: 10 — ABNORMAL LOW
Hemoglobin: 9.7 — ABNORMAL LOW
MCHC: 34.7
MCHC: 34.9
MCV: 93.3
MCV: 93.3
MCV: 93.7
MCV: 93.9
Platelets: 194
Platelets: 195
RBC: 2.99 — ABNORMAL LOW
RBC: 3.17 — ABNORMAL LOW
WBC: 10
WBC: 6.8

## 2011-09-21 LAB — DIFFERENTIAL
Basophils Absolute: 0
Eosinophils Relative: 2
Lymphocytes Relative: 26
Lymphs Abs: 2.4
Monocytes Absolute: 0.7
Neutro Abs: 6

## 2011-09-21 LAB — URINE MICROSCOPIC-ADD ON

## 2011-09-21 LAB — COMPREHENSIVE METABOLIC PANEL
AST: 15
Albumin: 4
BUN: 16
Calcium: 9.7
Chloride: 103
Creatinine, Ser: 1.1
GFR calc Af Amer: >60
GFR calc non Af Amer: 54 — ABNORMAL LOW
Total Bilirubin: 0.6

## 2011-09-21 LAB — URINALYSIS, ROUTINE W REFLEX MICROSCOPIC
Nitrite: NEGATIVE
Specific Gravity, Urine: 1.015
Urobilinogen, UA: 0.2
pH: 8.5 — ABNORMAL HIGH

## 2011-09-21 LAB — URINE CULTURE

## 2011-09-21 LAB — GASTRIC OCCULT BLOOD (1-CARD TO LAB): Occult Blood, Gastric: POSITIVE — AB

## 2011-09-21 LAB — LIPASE, BLOOD: Lipase: 24

## 2011-09-22 LAB — COMPREHENSIVE METABOLIC PANEL WITH GFR
ALT: 12
ALT: 15
ALT: 11
AST: 16
AST: 18
AST: 16
Albumin: 4.3
Albumin: 4.4
Albumin: 3.5
Alkaline Phosphatase: 92
Alkaline Phosphatase: 93
Alkaline Phosphatase: 71
BUN: 11
BUN: 13
BUN: 11
CO2: 23
CO2: 25
CO2: 25
Calcium: 10
Calcium: 10.1
Calcium: 8.6
Chloride: 102
Chloride: 99
Chloride: 102
Creatinine, Ser: 1.12
Creatinine, Ser: 1.17
Creatinine, Ser: 0.94
GFR calc non Af Amer: 50 — ABNORMAL LOW
GFR calc non Af Amer: 53 — ABNORMAL LOW
GFR calc non Af Amer: >60
Glucose, Bld: 346 — ABNORMAL HIGH
Glucose, Bld: 359 — ABNORMAL HIGH
Glucose, Bld: 154 — ABNORMAL HIGH
Potassium: 3.4 — ABNORMAL LOW
Potassium: 3.6
Potassium: 3.4 — ABNORMAL LOW
Sodium: 137
Sodium: 138
Sodium: 136
Total Bilirubin: 0.8
Total Bilirubin: 0.8
Total Bilirubin: 0.8
Total Protein: 7.6
Total Protein: 7.8
Total Protein: 6.4

## 2011-09-22 LAB — DIFFERENTIAL
Basophils Absolute: 0
Basophils Relative: 0
Basophils Relative: 1
Eosinophils Absolute: 0.1
Eosinophils Relative: 0
Eosinophils Relative: 1
Eosinophils Relative: 4
Lymphocytes Relative: 20
Lymphocytes Relative: 28
Lymphs Abs: 1.6
Lymphs Abs: 2.2
Monocytes Absolute: 0.4
Monocytes Absolute: 0.7
Monocytes Absolute: 0.7
Monocytes Relative: 5
Monocytes Relative: 7
Neutro Abs: 5.8
Neutrophils Relative %: 69
Neutrophils Relative %: 74

## 2011-09-22 LAB — COMPREHENSIVE METABOLIC PANEL
AST: 15
Albumin: 3.9
Calcium: 9.3
Creatinine, Ser: 1.06
GFR calc Af Amer: >60

## 2011-09-22 LAB — URINE MICROSCOPIC-ADD ON

## 2011-09-22 LAB — CBC
HCT: 36.2
HCT: 38.1
HCT: 27.8 — ABNORMAL LOW
HCT: 32.4 — ABNORMAL LOW
Hemoglobin: 12.5
Hemoglobin: 12.7
Hemoglobin: 11.3 — ABNORMAL LOW
Hemoglobin: 9.6 — ABNORMAL LOW
MCHC: 33.4
MCHC: 34.5
MCHC: 34.8
MCHC: 34.5
MCHC: 34.7
MCV: 91.8
MCV: 93.2
MCV: 93.2
MCV: 93.5
MCV: 94
Platelets: 236
Platelets: 261
Platelets: 312
Platelets: 193
Platelets: 219
RBC: 3.95
RBC: 4.09
RBC: 2.97 — ABNORMAL LOW
RBC: 3.45 — ABNORMAL LOW
RDW: 13.6
RDW: 13.7
RDW: 13.9
RDW: 13.9
WBC: 10.2
WBC: 7.9
WBC: 11.2 — ABNORMAL HIGH
WBC: 6.3

## 2011-09-22 LAB — URINALYSIS, ROUTINE W REFLEX MICROSCOPIC
Bilirubin Urine: NEGATIVE
Leukocytes, UA: NEGATIVE
Nitrite: NEGATIVE
Specific Gravity, Urine: 1.018
Urobilinogen, UA: 0.2
pH: 8.5 — ABNORMAL HIGH

## 2011-09-22 LAB — HEMOGLOBIN AND HEMATOCRIT, BLOOD
HCT: 27.8 — ABNORMAL LOW
Hemoglobin: 9.3 — ABNORMAL LOW

## 2011-09-22 LAB — SAMPLE TO BLOOD BANK

## 2011-09-22 LAB — LIPASE, BLOOD
Lipase: 21
Lipase: 53
Lipase: 63 — ABNORMAL HIGH

## 2011-09-23 LAB — URINALYSIS, ROUTINE W REFLEX MICROSCOPIC
Glucose, UA: >1000 — AB
Glucose, UA: NEGATIVE
Hgb urine dipstick: NEGATIVE
Ketones, ur: 40 — AB
Leukocytes, UA: NEGATIVE
Leukocytes, UA: NEGATIVE
Nitrite: NEGATIVE
Protein, ur: >300 — AB
Specific Gravity, Urine: 1.023
Urobilinogen, UA: 0.2
pH: 5.5
pH: 7.5

## 2011-09-23 LAB — CBC
HCT: 41
Hemoglobin: 14.4
MCHC: 35
MCV: 93.2
Platelets: 279
RBC: 4.4
RDW: 13.1
WBC: 14.8 — ABNORMAL HIGH

## 2011-09-23 LAB — DIFFERENTIAL
Basophils Absolute: 0
Basophils Relative: 0
Eosinophils Absolute: 0
Eosinophils Relative: 0
Lymphocytes Relative: 15
Lymphs Abs: 2.3
Monocytes Absolute: 1.3 — ABNORMAL HIGH
Monocytes Relative: 9
Neutro Abs: 11.3 — ABNORMAL HIGH
Neutrophils Relative %: 76

## 2011-09-23 LAB — COMPREHENSIVE METABOLIC PANEL
ALT: 16
AST: 19
Albumin: 4.7
Alkaline Phosphatase: 92
BUN: 34 — ABNORMAL HIGH
CO2: 30
Calcium: 9.9
Chloride: 93 — ABNORMAL LOW
Creatinine, Ser: 1.85 — ABNORMAL HIGH
GFR calc Af Amer: 36 — ABNORMAL LOW
GFR calc non Af Amer: 30 — ABNORMAL LOW
Glucose, Bld: 304 — ABNORMAL HIGH
Potassium: 3.1 — ABNORMAL LOW
Sodium: 140
Total Bilirubin: 1.2
Total Protein: 7.8

## 2011-09-23 LAB — URINE MICROSCOPIC-ADD ON

## 2011-09-23 LAB — POCT PREGNANCY, URINE: Preg Test, Ur: NEGATIVE

## 2011-09-23 NOTE — Discharge Summary (Signed)
NAME:  [name] [name], [name] [name] NO.:  [id number]  MEDICAL RECORD NO.:  [id number]  LOCATION:  [id number]                         FACILITY:  [facility]  PHYSICIAN:  Dalbert Mayotte, MD        DATE OF BIRTH:  September 15, 1966  DATE OF ADMISSION:  09/07/2011 DATE OF DISCHARGE:  09/09/2011                              DISCHARGE SUMMARY   PRIMARY CARE PHYSICIAN:  Marlana Salvage, MD, [name].  REASON FOR ADMISSION:  Cyclic vomiting syndrome.  DISCHARGE DIAGNOSES: 1. Cyclic vomiting syndrome. 2. Hypertension. 3. Diabetes mellitus. 4. Depression/anxiety. 5. Gastroesophageal reflux disease.  BRIEF HOSPITAL COURSE:  The patient presented to her primary care physician, Dr. Charlett Blake on the morning of September 07, 2011, with nausea and vomiting for 5 hours' duration.  Dr. Charlett Blake determined this characteristic in [name] [name]'s cyclic vomiting syndrome for which she has been hospitalized twice in the last 2 months.  Therefore, she was admitted to the [facility] Medicine Teaching Service, started on IV Phenergan, Reglan, Zofran, Ativan for nausea.  She was also given IV fluids. For pain control she was given morphine upon request, but it did not help her abdominal and back pain.  Therefore she was given IV Dilaudid 1 mg x2.  Vomiting resolved after approximately 8 hours and the inciting factor was possibly a fatty meal the night before.  However, the patient also endorses significant stress in her life, which may be the precipitating factor in her cyclic vomiting events.  Other issues during hospitalization were hypertension for which the patient was given carvedilol and lisinopril.  For reflux, the patient was given Carafate as well as Protonix.  The patient was maintained on her home dose of mirtazapine for depression.  [name] [name] was also given heparin for DVT prophylaxis.  The only abnormal lab value she had on her admission was elevated lipase x1.  However when the study was  repeated, lipase was also within normal limits.  DISCHARGE CONDITION:  On the day of discharge, the patient has been free of nausea and vomiting for over 24 hours.  She continues to endorse abdominal and back pain, but they are noted to be chronic in nature. Additionally, she also continues to be tearful about her current health situation.  The patient discharged on the following medications: 1. Aspirin 81 mg by mouth daily. 2. Carvedilol 12.5 mg by mouth twice daily. 3. Colace 100 mg by mouth twice daily. 4. Cyclobenzaprine 5 mg by mouth three times a day as needed. 5. EpiPen injection one injection subcutaneously daily. 6. Insulin glargine 22 units subcutaneously every morning. 7. Lidoderm 5% - one patch transdermally daily. 8. Lisinopril 245 mg by mouth daily. 9. Lorazepam 1 tablet by mouth every 6 hours as needed. 10.Mirtazapine 30 mg tablet by mouth daily at bedtime. 11.Insulin NovoLog 6 units subcutaneously daily. 12.Omeprazole 1 capsule by mouth daily. 13.Oxycodone one tablet by mouth every 6 hours as needed for pain. 14.Promethazine 25 mg 1 tablet by mouth every 4 hours as needed for     nausea. 15.Promethazine rectal suppository 25 mg one suppository rectally     every 6 hours as needed for nausea. 16.Sucralfate 1 g by mouth  four times daily as needed for ulcers.  FOLLOWUP:  Primary care physician, Dr. Marlana Salvage on Friday, September 10, 2011.  FOLLOWUP ISSUES:  Prevention of cyclic vomiting, hypertension, diabetes, anxiety, and depression.    ______________________________ Dewain Penning, MD   ______________________________ Dalbert Mayotte, MD    EW/MEDQ  D:  09/12/2011  T:  09/12/2011  Job:  [id number]  Electronically Signed by Dewain Penning  on 09/19/2011 12:12:47 AM Electronically Signed by Dalbert Mayotte MD on 09/23/2011 03:02:34 PM

## 2011-09-23 NOTE — Discharge Summary (Signed)
NAME:  [name] [name], ECKERSLEY NO.:  [id number]  MEDICAL RECORD NO.:  [id number]  LOCATION:  [id number]                         FACILITY:  [facility]  PHYSICIAN:  Dalbert Mayotte, MD        DATE OF BIRTH:  1966-12-19  DATE OF ADMISSION:  09/07/2011 DATE OF DISCHARGE:  09/09/2011                              DISCHARGE SUMMARY   PRIMARY CARE PHYSICIAN:  Marlana Salvage, M.D., [place].  REASON FOR ADMISSION:  Cyclic vomiting syndrome.  DISCHARGE DIAGNOSES: 1. Cyclic vomiting syndrome. 2. Hypertension. 3. Diabetes mellitus. 4. Depression and anxiety. 5. Gastroesophageal reflux disease.  BRIEF HOSPITAL COURSE:  This patient presented to her primary care physician Dr. Sonny Masters, on the morning of September 11 with nausea and vomiting for 5 hours duration.  Dr. Sonny Masters determined this was characteristic of Mrs. [name] [name]'s cyclic vomiting syndrome for which she has been hospitalized twice in the last 2 months for.  Therefore, she was admitted to [facility] and started on IV Phenergan, Reglan, Zofran and Ativan for nausea.  She was also given IV fluids for pain control.  She was given morphine upon request, but it did not help her abdominal and back pain.  Therefore, she was given IV Dilaudid 1 mg x2.  The vomiting resolved after approximately 8 hours and inciting factor was possibly may reflect the type of food she had the night before; however, the patient also endorses significant amount of stress in her life, which may be the precipitating factor in the cyclic vomiting events.  Other issues during the hospitalization were hypertension, the patient was given carvedilol and lisinopril; for reflux, the patient was given Carafate as well as Protonix.  The patient was maintained on home dose of mirtazapine for depression. The patient was also given heparin 5000 units q.8 h. for DVT prophylaxis.  A new abnormal lab value noted during her admission was an  elevated lipase x1; however, when the study was repeated, lipase was within normal limits.  DISCHARGE CONDITION:  On day of discharge, the patient had been free of nausea and vomiting for over 24 hours; however, she continued to endorse abdominal and back pain, they are noted to be chronic in nature. Additionally, she continues to be careful about her current health situation.  PATIENT DISCHARGED ON FOLLOWING MEDICATIONS: 1. Aspirin 81 mg by mouth daily. 2. Carvedilol 12.5 mg by mouth twice daily. 3. Colace 100 mg by mouth twice daily. 4. Cyclobenzaprine 5 mg by mouth 3  times a day as needed. 5. EpiPen injection, one ejection subcutaneously daily as needed. 6. Insulin glargine 22 units subcutaneously every morning. 7. Lidoderm/lidocaine patch 5% 1 patch transdermally daily. 8. Lisinopril 245 mg by mouth daily. 9. Lorazepam 1 tablet by mouth every 6 hours as needed. 10.Mirtazapine 30 mg tablet by mouth daily at bedtime. 11.Insulin NovoLog 6 units subcutaneous daily. 12.Omeprazole 1 capsule by mouth daily. 13.Oxycodone 1 tablet by mouth every 6 hours as needed for pain. 14.Promethazine 25 mg 1 tablet mouth every 4 hours as needed for     nausea. 15.Promethazine rectal suppository 25 mg 1 suppository rectally every     6 hours as needed for  nausea. 16.Sucralfate 1 gram by mouth 4 times daily as needed for ulcers.  FOLLOWUP:  The patient has follow-up with primary care physician, Dr. Marlana Salvage, on Friday, September 10, 2011.  FOLLOWUP ISSUES:  Included prevention of cyclic vomiting.  Hypertension treatment.  Diabetes treatment. Anxiety and depression treatment.    ______________________________ Dewain Penning, MD   ______________________________ Dalbert Mayotte, MD    EW/MEDQ  D:  09/11/2011  T:  09/11/2011  Job:  [id number]  Electronically Signed by Dewain Penning  on 09/19/2011 12:13:26 AM Electronically Signed by Dalbert Mayotte MD on 09/23/2011 03:02:40 PM

## 2011-09-24 LAB — BASIC METABOLIC PANEL
BUN: 13
BUN: 28 — ABNORMAL HIGH
CO2: 23
Calcium: 8.6
Calcium: 8.6
Chloride: 110
Creatinine, Ser: 1.03
Creatinine, Ser: 1.41 — ABNORMAL HIGH
GFR calc Af Amer: 49 — ABNORMAL LOW
GFR calc Af Amer: >60
GFR calc non Af Amer: 58 — ABNORMAL LOW
Glucose, Bld: 111 — ABNORMAL HIGH
Potassium: 4.1
Sodium: 136

## 2011-09-24 LAB — URINE MICROSCOPIC-ADD ON

## 2011-09-24 LAB — URINALYSIS, ROUTINE W REFLEX MICROSCOPIC
Glucose, UA: 500 — AB
Leukocytes, UA: NEGATIVE
Specific Gravity, Urine: 1.023
pH: 6

## 2011-09-24 LAB — CBC
HCT: 32.1 — ABNORMAL LOW
Hemoglobin: 11 — ABNORMAL LOW
MCHC: 34.3
MCV: 92.9
MCV: 93.5
Platelets: 183
Platelets: 213
RBC: 3.43 — ABNORMAL LOW
RBC: 3.7 — ABNORMAL LOW
RDW: 13.4
WBC: 12 — ABNORMAL HIGH
WBC: 8.4

## 2011-09-24 LAB — URINE CULTURE

## 2011-09-24 LAB — GRAM STAIN

## 2011-09-28 ENCOUNTER — Telehealth: Payer: Self-pay | Admitting: *Deleted

## 2011-09-28 NOTE — Telephone Encounter (Signed)
BS at 12:30 was 56 and at 1:30 was 81.  Advised her to go ahead eat lunch and to call me back at 2:30 with another reading.

## 2011-09-28 NOTE — Telephone Encounter (Signed)
Patient calling stating that she took 22 units of her Novolin this morning instead of 6 units.  Asked patient if she had anything in the house such as hard candy.  She only has regular Gingerale.  Advised her to go ahead and drink some.  Consulted with Dr. Andria Frames who suggested that she eat breakfast if she hasn't already.  He also instructed that she should also go ahead and take the long acting Lantus but to only take 10 units instead of the 22 and to check her BS in about 30 minutes.  Told patient to call me back to let me know what her BS reading was.  Verified that she did have someone there with her.

## 2011-09-28 NOTE — Telephone Encounter (Signed)
BS at 2:30 was 65.  She had not had anything to eat since 11:30.  Advised her to eat a PB&J sandwich and  tocheck her BS again at 3:30.  Told her she was probably int he clear since it had been 4 hours since she gave herself the Novolin.  Called patient back at 4:30 and she had not completed any more test d/t running our of test strips.  She reports that she feels fine.  Advised her to resume the 22 of Lantus in the am and call us if her BS was out of range.  Patient agreable and appreciative.

## 2011-09-28 NOTE — Telephone Encounter (Signed)
Checked her BS as soon as we got off the phone and it was 383.  I called her back just now (11:30) and it was 247.  Told her to check it again in an hour and to call me back with the results.

## 2011-09-29 LAB — COMPREHENSIVE METABOLIC PANEL
Alkaline Phosphatase: 69
BUN: 18
CO2: 25
Chloride: 102
Creatinine, Ser: 1.05
GFR calc non Af Amer: 57 — ABNORMAL LOW
Glucose, Bld: 211 — ABNORMAL HIGH
Potassium: 3.7
Total Bilirubin: 1

## 2011-09-29 LAB — CBC
HCT: 33.5 — ABNORMAL LOW
Hemoglobin: 11.4 — ABNORMAL LOW
MCV: 93.8
WBC: 7.7

## 2011-09-29 LAB — LIPASE, BLOOD: Lipase: 37

## 2011-09-29 LAB — URINALYSIS, ROUTINE W REFLEX MICROSCOPIC
Nitrite: NEGATIVE
Protein, ur: 100 — AB
Urobilinogen, UA: 1

## 2011-09-29 LAB — AMYLASE: Amylase: 213 — ABNORMAL HIGH

## 2011-09-29 LAB — RAPID URINE DRUG SCREEN, HOSP PERFORMED
Amphetamines: NOT DETECTED
Barbiturates: NOT DETECTED
Benzodiazepines: NOT DETECTED
Tetrahydrocannabinol: POSITIVE — AB

## 2011-09-29 LAB — DIFFERENTIAL
Basophils Absolute: 0
Basophils Relative: 0
Lymphocytes Relative: 22
Monocytes Absolute: 0.3
Neutro Abs: 5.6
Neutrophils Relative %: 73

## 2011-09-29 LAB — URINE MICROSCOPIC-ADD ON

## 2011-09-30 LAB — POCT I-STAT, CHEM 8
BUN: 22 mg/dL (ref 6–23)
Creatinine, Ser: 1.1 mg/dL (ref 0.4–1.2)
Potassium: 3.4 mEq/L — ABNORMAL LOW (ref 3.5–5.1)
Sodium: 139 mEq/L (ref 135–145)

## 2011-09-30 LAB — URINALYSIS, ROUTINE W REFLEX MICROSCOPIC
Bilirubin Urine: NEGATIVE
Protein, ur: 100 mg/dL — AB
Urobilinogen, UA: 0.2 mg/dL (ref 0.0–1.0)

## 2011-09-30 LAB — URINE MICROSCOPIC-ADD ON

## 2011-10-01 LAB — COMPREHENSIVE METABOLIC PANEL
ALT: 12
AST: 15
AST: 20
Albumin: 4.4
BUN: 31 — ABNORMAL HIGH
CO2: 21
CO2: 27
Calcium: 9.8
Chloride: 101
Chloride: 101
Creatinine, Ser: 1.27 — ABNORMAL HIGH
Creatinine, Ser: 1.3 — ABNORMAL HIGH
GFR calc Af Amer: 56 — ABNORMAL LOW
GFR calc non Af Amer: 45 — ABNORMAL LOW
Sodium: 136
Total Bilirubin: 0.6
Total Bilirubin: 0.8

## 2011-10-01 LAB — CBC
HCT: 32.8 — ABNORMAL LOW
HCT: 36.7
Hemoglobin: 11.3 — ABNORMAL LOW
Hemoglobin: 9.9 — ABNORMAL LOW
MCV: 93.3
Platelets: 313
RBC: 3.1 — ABNORMAL LOW
RBC: 3.52 — ABNORMAL LOW
RDW: 13.7
WBC: 11.1 — ABNORMAL HIGH
WBC: 9.6
WBC: 9.9

## 2011-10-01 LAB — URINALYSIS, ROUTINE W REFLEX MICROSCOPIC
Bilirubin Urine: NEGATIVE
Glucose, UA: 500 — AB
Specific Gravity, Urine: 1.025

## 2011-10-01 LAB — CARDIAC PANEL(CRET KIN+CKTOT+MB+TROPI)
CK, MB: 1.6
CK, MB: 1.7
CK, MB: 2.4
Relative Index: INVALID
Total CK: 45
Total CK: 46
Troponin I: 0.04

## 2011-10-01 LAB — BASIC METABOLIC PANEL
BUN: 22
CO2: 24
Chloride: 105
GFR calc non Af Amer: 52 — ABNORMAL LOW
Glucose, Bld: 187 — ABNORMAL HIGH
Potassium: 3.7
Potassium: 4.1
Sodium: 135

## 2011-10-01 LAB — DIFFERENTIAL
Basophils Absolute: 0
Basophils Absolute: 0
Basophils Relative: 0
Eosinophils Absolute: 0
Eosinophils Relative: 0
Eosinophils Relative: 0
Lymphocytes Relative: 12
Lymphs Abs: 1.6
Neutro Abs: 8 — ABNORMAL HIGH
Neutrophils Relative %: 78 — ABNORMAL HIGH

## 2011-10-01 LAB — URINE MICROSCOPIC-ADD ON

## 2011-10-01 LAB — POCT CARDIAC MARKERS
Myoglobin, poc: 114
Operator id: 146091

## 2011-10-01 LAB — LIPASE, BLOOD: Lipase: 22

## 2011-10-01 LAB — URINE CULTURE: Special Requests: NEGATIVE

## 2011-10-04 LAB — BASIC METABOLIC PANEL
Calcium: 8.8
GFR calc Af Amer: >60
GFR calc non Af Amer: 49 — ABNORMAL LOW
GFR calc non Af Amer: >60
Glucose, Bld: 106 — ABNORMAL HIGH
Glucose, Bld: 230 — ABNORMAL HIGH
Potassium: 4.3
Sodium: 134 — ABNORMAL LOW
Sodium: 140

## 2011-10-04 LAB — CBC
HCT: 23.9 — ABNORMAL LOW
Hemoglobin: 8.1 — ABNORMAL LOW
Hemoglobin: 9.6 — ABNORMAL LOW
Platelets: 271
RDW: 13.4
WBC: 6.6

## 2011-10-05 LAB — COMPREHENSIVE METABOLIC PANEL
ALT: 11
ALT: 12
AST: 13
AST: 14
AST: 15
Albumin: 4
Albumin: 4.2
Albumin: 3.8
Alkaline Phosphatase: 55
Alkaline Phosphatase: 56
Calcium: 9.5
Calcium: 9.5
Chloride: 104
Creatinine, Ser: 1.05
GFR calc Af Amer: >60
GFR calc Af Amer: >60
Potassium: 3.5
Potassium: 3.5
Sodium: 135
Sodium: 137
Total Protein: 7.1
Total Protein: 7.4

## 2011-10-05 LAB — BASIC METABOLIC PANEL
BUN: 11
BUN: 16
BUN: 6
CO2: 23
CO2: 24
Calcium: 8.8
Chloride: 105
Creatinine, Ser: 1.06
Creatinine, Ser: 1.2
GFR calc non Af Amer: >60
Glucose, Bld: 227 — ABNORMAL HIGH
Glucose, Bld: 249 — ABNORMAL HIGH
Glucose, Bld: 319 — ABNORMAL HIGH
Potassium: 3.5
Sodium: 136

## 2011-10-05 LAB — DIFFERENTIAL
Basophils Absolute: 0
Basophils Absolute: 0
Basophils Relative: 0
Basophils Relative: 0
Eosinophils Absolute: 0.1 — ABNORMAL LOW
Eosinophils Absolute: 0.1
Eosinophils Relative: 1
Eosinophils Relative: 2
Lymphocytes Relative: 48 — ABNORMAL HIGH
Lymphs Abs: 2.1
Monocytes Absolute: 0.6
Monocytes Absolute: 0.6
Monocytes Relative: 7
Neutrophils Relative %: 66

## 2011-10-05 LAB — I-STAT 8, (EC8 V) (CONVERTED LAB)
Acid-Base Excess: 2
Acid-base deficit: 3 — ABNORMAL HIGH
Chloride: 106
Chloride: 108
HCT: 32 — ABNORMAL LOW
Operator id: 192351
Potassium: 3.8
TCO2: 24
pCO2, Ven: 25.6 — ABNORMAL LOW
pH, Ven: 7.56 — ABNORMAL HIGH

## 2011-10-05 LAB — CBC
HCT: 32.2 — ABNORMAL LOW
HCT: 28.5 — ABNORMAL LOW
Hemoglobin: 10.6 — ABNORMAL LOW
Hemoglobin: 11 — ABNORMAL LOW
MCHC: 34.3
Platelets: 304
Platelets: 306
Platelets: 250
RDW: 13.2
RDW: 13.3
RDW: 13.4
RDW: 13.5
WBC: 9.8

## 2011-10-05 LAB — URINALYSIS, ROUTINE W REFLEX MICROSCOPIC
Glucose, UA: NEGATIVE
Glucose, UA: NEGATIVE
Glucose, UA: NEGATIVE
Ketones, ur: 15 — AB
Ketones, ur: 15 — AB
Ketones, ur: 15 — AB
Leukocytes, UA: NEGATIVE
Nitrite: NEGATIVE
Protein, ur: 30 — AB
Specific Gravity, Urine: 1.017
pH: 7
pH: 7.5

## 2011-10-05 LAB — URINE CULTURE: Colony Count: >=100000

## 2011-10-05 LAB — URINE MICROSCOPIC-ADD ON

## 2011-10-05 LAB — POCT I-STAT CREATININE
Creatinine, Ser: 1.1
Creatinine, Ser: 1.1
Operator id: 272551

## 2011-10-05 LAB — HEPATIC FUNCTION PANEL
Bilirubin, Direct: 0.2
Indirect Bilirubin: 0.8

## 2011-10-07 LAB — BASIC METABOLIC PANEL
BUN: 18
Calcium: 8.6
Calcium: 9.5
Chloride: 101
GFR calc Af Amer: 36 — ABNORMAL LOW
GFR calc Af Amer: 43 — ABNORMAL LOW
GFR calc Af Amer: >60
GFR calc non Af Amer: 30 — ABNORMAL LOW
GFR calc non Af Amer: 35 — ABNORMAL LOW
GFR calc non Af Amer: 52 — ABNORMAL LOW
Glucose, Bld: 151 — ABNORMAL HIGH
Potassium: 4
Potassium: 4
Potassium: 4.6
Sodium: 135
Sodium: 137

## 2011-10-07 LAB — COMPREHENSIVE METABOLIC PANEL
ALT: 11
AST: 18
AST: 15
Albumin: 4.2
Albumin: 4
Calcium: 9.9
Chloride: 99
Chloride: 104
Creatinine, Ser: 1.94 — ABNORMAL HIGH
Creatinine, Ser: 1.24 — ABNORMAL HIGH
GFR calc Af Amer: 34 — ABNORMAL LOW
GFR calc Af Amer: 57 — ABNORMAL LOW
Sodium: 138
Total Bilirubin: 0.7
Total Bilirubin: 0.7
Total Protein: 7.4

## 2011-10-07 LAB — URINALYSIS, ROUTINE W REFLEX MICROSCOPIC
Bilirubin Urine: NEGATIVE
Bilirubin Urine: NEGATIVE
Glucose, UA: NEGATIVE
Ketones, ur: NEGATIVE
Leukocytes, UA: NEGATIVE
Nitrite: POSITIVE — AB
Protein, ur: 30 — AB
Specific Gravity, Urine: 1.015
Specific Gravity, Urine: 1.013
Urobilinogen, UA: 1
pH: 8.5 — ABNORMAL HIGH
pH: 8

## 2011-10-07 LAB — PROTEIN, URINE, 24 HOUR
Protein, 24H Urine: 228 — ABNORMAL HIGH
Urine Total Volume-UPROT: 2850

## 2011-10-07 LAB — URINE MICROSCOPIC-ADD ON

## 2011-10-07 LAB — CBC
HCT: 25.9 — ABNORMAL LOW
Hemoglobin: 8.9 — ABNORMAL LOW
Hemoglobin: 11.6 — ABNORMAL LOW
MCHC: 33.9
MCV: 95.8
MCV: 95.7
Platelets: 293
RBC: 2.74 — ABNORMAL LOW
RBC: 3.58 — ABNORMAL LOW
RDW: 13.6
WBC: 8.1

## 2011-10-07 LAB — URINE DRUGS OF ABUSE SCREEN W ALC, ROUTINE (REF LAB)
Barbiturate Quant, Ur: NEGATIVE
Benzodiazepines.: NEGATIVE
Creatinine,U: 51.4
Ethyl Alcohol: <5
Marijuana Metabolite: POSITIVE — AB
Opiate Screen, Urine: NEGATIVE
Phencyclidine (PCP): NEGATIVE

## 2011-10-07 LAB — DIFFERENTIAL
Basophils Relative: 0
Eosinophils Absolute: 0
Eosinophils Relative: 1
Lymphocytes Relative: 25
Lymphs Abs: 2.1
Monocytes Absolute: 0.7
Monocytes Absolute: 0.6
Monocytes Relative: 9
Monocytes Relative: 6
Neutro Abs: 5.3
Neutrophils Relative %: 78 — ABNORMAL HIGH

## 2011-10-07 LAB — LIPID PANEL
Cholesterol: 128
HDL: 45
Total CHOL/HDL Ratio: 2.8

## 2011-10-07 LAB — RETICULOCYTES
RBC.: 2.83 — ABNORMAL LOW
Retic Count, Absolute: 19.8
Retic Ct Pct: 0.7

## 2011-10-07 LAB — TSH: TSH: 1.187

## 2011-10-07 LAB — T4, FREE: Free T4: 1.34

## 2011-10-07 LAB — THC (MARIJUANA), URINE, CONFIRMATION: Marijuana, Ur-Confirmation: 41 ng/mL

## 2011-10-07 LAB — ETHANOL: Alcohol, Ethyl (B): <5

## 2011-10-08 LAB — CBC
HCT: 33.8 — ABNORMAL LOW
HCT: 34.3 — ABNORMAL LOW
Hemoglobin: 11.6 — ABNORMAL LOW
Hemoglobin: 12
MCHC: 34.4
MCHC: 35.1
MCV: 93.2
MCV: 94.8
Platelets: 232
Platelets: 303
RBC: 2.64 — ABNORMAL LOW
RDW: 13.9
RDW: 14.1 — ABNORMAL HIGH
WBC: 6.5
WBC: 8.2

## 2011-10-08 LAB — URINALYSIS, ROUTINE W REFLEX MICROSCOPIC
Glucose, UA: >1000 — AB
Ketones, ur: NEGATIVE
Leukocytes, UA: NEGATIVE
Nitrite: NEGATIVE
Specific Gravity, Urine: 1.025
pH: 6

## 2011-10-08 LAB — URINE CULTURE: Colony Count: >=100000

## 2011-10-08 LAB — DIFFERENTIAL
Basophils Relative: 0
Lymphs Abs: 0.8
Monocytes Relative: 8
Neutro Abs: 11.1 — ABNORMAL HIGH
Neutrophils Relative %: 86 — ABNORMAL HIGH

## 2011-10-08 LAB — URINE MICROSCOPIC-ADD ON

## 2011-10-08 LAB — BASIC METABOLIC PANEL
BUN: 18
BUN: 32 — ABNORMAL HIGH
Calcium: 8.3 — ABNORMAL LOW
Calcium: 9.7
Chloride: 108
Creatinine, Ser: 0.99
Creatinine, Ser: 1.11
GFR calc Af Amer: >60
GFR calc non Af Amer: 31 — ABNORMAL LOW
GFR calc non Af Amer: 54 — ABNORMAL LOW
Glucose, Bld: 81
Potassium: 3.6
Sodium: 137
Sodium: 142

## 2011-10-08 LAB — COMPREHENSIVE METABOLIC PANEL
BUN: 27 — ABNORMAL HIGH
Calcium: 10.5
Glucose, Bld: 371 — ABNORMAL HIGH
Total Protein: 8.5 — ABNORMAL HIGH

## 2011-10-08 LAB — PROTIME-INR
INR: 1.1
Prothrombin Time: 14.1

## 2011-10-08 LAB — POCT CARDIAC MARKERS
Myoglobin, poc: >500
Operator id: 146091
Operator id: 285841
Troponin i, poc: 0.99
Troponin i, poc: 1.09

## 2011-10-08 LAB — CULTURE, BLOOD (ROUTINE X 2)

## 2011-10-08 LAB — HEMOGLOBIN AND HEMATOCRIT, BLOOD: Hemoglobin: 9.3 — ABNORMAL LOW

## 2011-10-08 LAB — CARDIAC PANEL(CRET KIN+CKTOT+MB+TROPI)
Relative Index: 2.3
Troponin I: 2.17
Troponin I: 2.33

## 2011-10-08 LAB — RAPID URINE DRUG SCREEN, HOSP PERFORMED
Amphetamines: NOT DETECTED
Benzodiazepines: POSITIVE — AB
Cocaine: NOT DETECTED
Opiates: POSITIVE — AB
Tetrahydrocannabinol: POSITIVE — AB

## 2011-10-08 LAB — HEPARIN LEVEL (UNFRACTIONATED): Heparin Unfractionated: 1.13 — ABNORMAL HIGH

## 2011-10-10 ENCOUNTER — Emergency Department (HOSPITAL_COMMUNITY)
Admission: EM | Admit: 2011-10-10 | Discharge: 2011-10-10 | Discharge status: Against Medical Advice | Payer: Medicare Other | Attending: Emergency Medicine | Admitting: Emergency Medicine

## 2011-10-10 DIAGNOSIS — Z0389 Encounter for observation for other suspected diseases and conditions ruled out: Secondary | ICD-10-CM | POA: Insufficient documentation

## 2011-10-13 LAB — BASIC METABOLIC PANEL
BUN: 11
BUN: 12
CO2: 27
Calcium: 8.6
Chloride: 104
Chloride: 97
Creatinine, Ser: 0.96
Creatinine, Ser: 1.11
GFR calc Af Amer: >60
GFR calc non Af Amer: 41 — ABNORMAL LOW
Glucose, Bld: 122 — ABNORMAL HIGH
Glucose, Bld: 237 — ABNORMAL HIGH
Potassium: 3.4 — ABNORMAL LOW
Sodium: 141

## 2011-10-13 LAB — COMPREHENSIVE METABOLIC PANEL
ALT: 15
AST: 16
Albumin: 3.9
CO2: 29
Chloride: 99
GFR calc Af Amer: 55 — ABNORMAL LOW
GFR calc non Af Amer: 45 — ABNORMAL LOW
Sodium: 138
Total Bilirubin: 0.7

## 2011-10-13 LAB — HEMOGLOBIN AND HEMATOCRIT, BLOOD
HCT: 33.2 — ABNORMAL LOW
Hemoglobin: 10.9 — ABNORMAL LOW
Hemoglobin: 11.1 — ABNORMAL LOW

## 2011-10-13 LAB — CBC
HCT: 32.3 — ABNORMAL LOW
Hemoglobin: 10.9 — ABNORMAL LOW
WBC: 8.4

## 2011-10-13 LAB — URINALYSIS, ROUTINE W REFLEX MICROSCOPIC
Bilirubin Urine: NEGATIVE
Ketones, ur: NEGATIVE
Nitrite: NEGATIVE
Protein, ur: 30 — AB
Urobilinogen, UA: 0.2

## 2011-10-13 LAB — APTT: aPTT: 31

## 2011-10-13 LAB — PROTIME-INR: Prothrombin Time: 13

## 2011-10-13 LAB — HEMOGLOBIN A1C: Hgb A1c MFr Bld: 6.6 — ABNORMAL HIGH

## 2011-10-13 LAB — TYPE AND SCREEN
ABO/RH(D): O POS
Antibody Screen: NEGATIVE

## 2011-10-14 ENCOUNTER — Other Ambulatory Visit: Payer: Self-pay | Admitting: Family Medicine

## 2011-10-14 LAB — BASIC METABOLIC PANEL
Calcium: 7.8 — ABNORMAL LOW
Calcium: 8.2 — ABNORMAL LOW
Chloride: 108
Creatinine, Ser: 0.88
GFR calc Af Amer: >60
GFR calc Af Amer: >60
GFR calc non Af Amer: >60
Sodium: 137

## 2011-10-14 LAB — URINALYSIS, ROUTINE W REFLEX MICROSCOPIC
Bilirubin Urine: NEGATIVE
Glucose, UA: 100 — AB
Glucose, UA: NEGATIVE
Glucose, UA: NEGATIVE
Ketones, ur: 40 — AB
Ketones, ur: NEGATIVE
Leukocytes, UA: NEGATIVE
Leukocytes, UA: NEGATIVE
Leukocytes, UA: NEGATIVE
Nitrite: NEGATIVE
Nitrite: NEGATIVE
Protein, ur: >300 — AB
Protein, ur: >300 — AB
Specific Gravity, Urine: 1.016
pH: 6
pH: 6.5
pH: 8

## 2011-10-14 LAB — DIFFERENTIAL
Basophils Absolute: 0
Basophils Absolute: 0
Basophils Absolute: 0.1
Basophils Relative: 0
Eosinophils Absolute: 0
Eosinophils Absolute: 0.1
Eosinophils Absolute: 0.1
Eosinophils Relative: 0
Eosinophils Relative: 1
Lymphocytes Relative: 13
Lymphocytes Relative: 27
Lymphs Abs: 1.2
Monocytes Absolute: 0.7
Neutro Abs: 4
Neutrophils Relative %: 50
Neutrophils Relative %: 85 — ABNORMAL HIGH

## 2011-10-14 LAB — CBC
HCT: 34 — ABNORMAL LOW
MCHC: 33.4
MCV: 94.1
Platelets: 239
Platelets: 247
Platelets: 254
Platelets: 276
RBC: 3.83 — ABNORMAL LOW
RDW: 12.9
RDW: 13
RDW: 13.3
WBC: 10
WBC: 8.5
WBC: 9.3

## 2011-10-14 LAB — COMPREHENSIVE METABOLIC PANEL
ALT: 11
ALT: 13
AST: 13
AST: 15
Albumin: 4.2
Albumin: 4.2
BUN: 20
Chloride: 101
Chloride: 104
Creatinine, Ser: 1.12
GFR calc Af Amer: >60
GFR calc Af Amer: >60
GFR calc non Af Amer: 52 — ABNORMAL LOW
Potassium: 3.2 — ABNORMAL LOW
Potassium: 3.5
Sodium: 138
Sodium: 139
Total Bilirubin: 0.6
Total Bilirubin: 0.7
Total Protein: 7.4

## 2011-10-14 LAB — I-STAT 8, (EC8 V) (CONVERTED LAB)
Acid-base deficit: 2
BUN: 16
Bicarbonate: 25.3 — ABNORMAL HIGH
Chloride: 104
HCT: 38
HCT: 40
Hemoglobin: 12.9
Hemoglobin: 13.6
Operator id: 277751
Operator id: 279831
Potassium: 3.6
Sodium: 139
TCO2: 26
pCO2, Ven: 36 — ABNORMAL LOW

## 2011-10-14 LAB — POCT I-STAT CREATININE
Creatinine, Ser: 1
Creatinine, Ser: 1.3 — ABNORMAL HIGH

## 2011-10-14 LAB — URINE CULTURE: Colony Count: >=100000

## 2011-10-14 LAB — URINE MICROSCOPIC-ADD ON

## 2011-10-14 LAB — LIPASE, BLOOD: Lipase: 54

## 2011-10-15 NOTE — Telephone Encounter (Signed)
Refill request

## 2011-10-20 ENCOUNTER — Ambulatory Visit: Payer: Medicare Other | Admitting: Family Medicine

## 2011-10-20 ENCOUNTER — Encounter: Payer: Self-pay | Admitting: *Deleted

## 2011-10-20 ENCOUNTER — Ambulatory Visit (INDEPENDENT_AMBULATORY_CARE_PROVIDER_SITE_OTHER): Payer: Medicare Other | Admitting: Family Medicine

## 2011-10-20 ENCOUNTER — Encounter: Payer: Self-pay | Admitting: Family Medicine

## 2011-10-20 DIAGNOSIS — K3184 Gastroparesis: Secondary | ICD-10-CM

## 2011-10-20 DIAGNOSIS — R1115 Cyclical vomiting syndrome unrelated to migraine: Secondary | ICD-10-CM

## 2011-10-20 MED ORDER — LORAZEPAM 2 MG/ML IJ SOLN: 2.0000 mg | INTRAMUSCULAR | Status: DC | PRN

## 2011-10-20 MED ORDER — PROMETHAZINE HCL 25 MG/ML IJ SOLN
25.0000 mg | Freq: Four times a day (QID) | INTRAMUSCULAR | Status: DC | PRN
  Administered 2011-10-20: 25 mg via INTRAMUSCULAR

## 2011-10-20 MED ORDER — MORPHINE SULFATE 2 MG/ML IJ SOLN
2.0000 mg | INTRAMUSCULAR | Status: DC | PRN
  Administered 2011-10-20: 2 mg via INTRAMUSCULAR
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
## 2011-10-20 NOTE — Assessment & Plan Note (Signed)
Pt given 50mg  IM phenergan, 2mg  IM morphine, and 2 mg ativan IM in clinic. Pt symptomatically improved with this regimen and feels that she will be able to manage rest of flare at home. Pt has very good insight to disease. Discussed red flags for return.  Pt also re-referred for formal gastroenterology evaluation ( initial referral made in 04/2011). Stressed importance of followup for this. Pt expressed understanding.

## 2011-10-23 ENCOUNTER — Inpatient Hospital Stay (HOSPITAL_COMMUNITY)
Admission: EM | Admit: 2011-10-23 | Discharge: 2011-10-26 | DRG: 074 | Disposition: A | Discharge status: Home / Self Care | Payer: Medicare Other | Source: Ambulatory Visit | Attending: Family Medicine | Admitting: Family Medicine

## 2011-10-23 ENCOUNTER — Encounter (HOSPITAL_COMMUNITY): Payer: Self-pay | Admitting: Family Medicine

## 2011-10-23 ENCOUNTER — Emergency Department (HOSPITAL_COMMUNITY): Payer: Medicare Other

## 2011-10-23 DIAGNOSIS — F3289 Other specified depressive episodes: Secondary | ICD-10-CM | POA: Diagnosis present

## 2011-10-23 DIAGNOSIS — E1322 Other specified diabetes mellitus with diabetic chronic kidney disease: Secondary | ICD-10-CM | POA: Insufficient documentation

## 2011-10-23 DIAGNOSIS — R1115 Cyclical vomiting syndrome unrelated to migraine: Secondary | ICD-10-CM | POA: Diagnosis present

## 2011-10-23 DIAGNOSIS — F329 Major depressive disorder, single episode, unspecified: Secondary | ICD-10-CM | POA: Diagnosis present

## 2011-10-23 DIAGNOSIS — E1149 Type 2 diabetes mellitus with other diabetic neurological complication: Principal | ICD-10-CM | POA: Diagnosis present

## 2011-10-23 DIAGNOSIS — E1142 Type 2 diabetes mellitus with diabetic polyneuropathy: Secondary | ICD-10-CM | POA: Diagnosis present

## 2011-10-23 DIAGNOSIS — E785 Hyperlipidemia, unspecified: Secondary | ICD-10-CM | POA: Diagnosis present

## 2011-10-23 DIAGNOSIS — G43909 Migraine, unspecified, not intractable, without status migrainosus: Secondary | ICD-10-CM | POA: Diagnosis present

## 2011-10-23 DIAGNOSIS — IMO0002 Reserved for concepts with insufficient information to code with codable children: Secondary | ICD-10-CM

## 2011-10-23 DIAGNOSIS — M549 Dorsalgia, unspecified: Secondary | ICD-10-CM | POA: Diagnosis present

## 2011-10-23 DIAGNOSIS — Z7982 Long term (current) use of aspirin: Secondary | ICD-10-CM

## 2011-10-23 DIAGNOSIS — K3184 Gastroparesis: Secondary | ICD-10-CM | POA: Diagnosis present

## 2011-10-23 DIAGNOSIS — I129 Hypertensive chronic kidney disease with stage 1 through stage 4 chronic kidney disease, or unspecified chronic kidney disease: Secondary | ICD-10-CM | POA: Diagnosis present

## 2011-10-23 DIAGNOSIS — N183 Chronic kidney disease, stage 3 unspecified: Secondary | ICD-10-CM | POA: Diagnosis present

## 2011-10-23 DIAGNOSIS — F17200 Nicotine dependence, unspecified, uncomplicated: Secondary | ICD-10-CM | POA: Diagnosis present

## 2011-10-23 HISTORY — DX: Acute pancreatitis without necrosis or infection, unspecified: K85.90

## 2011-10-23 HISTORY — DX: Type 2 diabetes mellitus with other diabetic neurological complication: E11.49

## 2011-10-23 HISTORY — DX: Reserved for concepts with insufficient information to code with codable children: IMO0002

## 2011-10-23 HISTORY — DX: Other specified diabetes mellitus with diabetic chronic kidney disease: E13.22

## 2011-10-23 LAB — COMPREHENSIVE METABOLIC PANEL
Albumin: 4.4 g/dL (ref 3.5–5.2)
Alkaline Phosphatase: 111 U/L (ref 39–117)
BUN: 36 mg/dL — ABNORMAL HIGH (ref 6–23)
Chloride: 99 mEq/L (ref 96–112)
GFR calc Af Amer: 37 mL/min — ABNORMAL LOW (ref >90)
Glucose, Bld: 473 mg/dL — ABNORMAL HIGH (ref 70–99)
Potassium: 3.8 mEq/L (ref 3.5–5.1)
Total Bilirubin: 0.3 mg/dL (ref 0.3–1.2)

## 2011-10-23 LAB — DIFFERENTIAL
Basophils Absolute: 0 10*3/uL (ref 0.0–0.1)
Lymphocytes Relative: 27 % (ref 12–46)
Neutro Abs: 5.9 10*3/uL (ref 1.7–7.7)
Neutrophils Relative %: 66 % (ref 43–77)

## 2011-10-23 LAB — GLUCOSE, CAPILLARY
Glucose-Capillary: 360 mg/dL — ABNORMAL HIGH (ref 70–99)
Glucose-Capillary: 313 mg/dL — ABNORMAL HIGH (ref 70–99)
Glucose-Capillary: 252 mg/dL — ABNORMAL HIGH (ref 70–99)

## 2011-10-23 LAB — LIPASE, BLOOD: Lipase: 44 U/L (ref 11–59)

## 2011-10-23 LAB — URINALYSIS, ROUTINE W REFLEX MICROSCOPIC
Bilirubin Urine: NEGATIVE
Ketones, ur: NEGATIVE mg/dL
Nitrite: NEGATIVE
pH: 7 (ref 5.0–8.0)

## 2011-10-23 LAB — CBC
HCT: 34.7 % — ABNORMAL LOW (ref 36.0–46.0)
Hemoglobin: 12.4 g/dL (ref 12.0–15.0)
WBC: 8.9 10*3/uL (ref 4.0–10.5)

## 2011-10-23 NOTE — H&P (Signed)
[facility] Admission History and Physical  Patient name: [name] [name] Medical record number: [id number] Date of birth: 1966/10/15 Age: 45 y.o. Gender: female  Primary Care Provider: Marlana Salvage, MD of [facility].   Chief Complaint: nausea/vomiting/abdominal pain  History of Present Illness: [name] [name] is a 45 y.o. year old female well known to the family medicine service with DM gastroparesis and cyclical vomiting presenting with persistent vomiting.emale well known to the family medicine service with DM gastroparesis and cyclical vomiting presenting with persistent vomiting.   Patient seen in clinic on 10/24 with  with intractable nausea and vomiting starting that AM worsening from when she was seen in ED over weekend. Grill smoke as potential trigger. Patient has been given IM phenergan, IM morphine, and IM ativan regimen in clinic up to BID to reduce admissions. She received this regimen in clinic and her symptoms improved.     She has felt poorly since being seen in clinic though with intractable nausea/vomiting starting again this morning. No fevers or recent sick contacts. Patient with some mild chest discomfort when vomiting. Also with mild headache. Patient says nothing different from previous episodes. Very small amount of blood on 1 emesis earlier today.   No known triggers for this current event. Marijuana use reported but 2 weeks ago as patient states couldn't afford currently. Patient seen in ED and received IV zofran, pepcid, dilaudid, reglan, ativan, and GI cocktail. She also received 2L of IVF. Her nausea vomiting persisted so family medicine was consulted to admit patient for further management.    Past Medical History:  Diagnoses  . NEUROPATHY, DIABETIC  . DIABETES MELLITUS, II, COMPLICATIONS  . DEPRESSION, MAJOR, RECURRENT  . ANXIETY  . TOBACCO DEPENDENCE  . MIGRAINE, UNSPEC., W/O INTRACTABLE MIGRAINE  . HYPERTENSION, BENIGN SYSTEMIC  . GERD  . BACK PAIN, CHRONIC  . INSOMNIA NOS  . Cyclic vomiting syndrome  DM Gastroparesis  Past  Surgical History: Past Surgical History  Procedure Date  . Port-a-cath placement   . Cholecystectomy   . Abdominal hysterectomy   . Laparotomy and lysis of adhesions    Social History: Social History  . Marital Status: Single   Social History Main Topics  . Smoking status: Current Some Day Smoker  . Smokeless tobacco: Never Used   Comment: 1 cig this week  . Drug Use: Marijuana occasionally  . Sexually Active: Not on file    Family History: Family History  Problem Relation Age of Onset  . Heart attack Mother 85    death    Allergies: Allergies  Allergen Reactions  . Erythromycin Nausea And Vomiting  . Oxycodone-Acetaminophen Itching    Current Facility-Administered Medications  Medication Dose Route Frequency Provider Last Rate Last Dose  . LORazepam (ATIVAN) 2 MG/ML injection 2 mg  2 mg Intramuscular Q4H PRN Shanda Howells      . LORazepam (ATIVAN) injection 2 mg  2 mg Intramuscular Q4H PRN Shanda Howells   2 mg at 10/20/11 1034  . morphine 2 MG/ML injection 2 mg  2 mg Intramuscular Q4H PRN Shanda Howells   2 mg at 10/20/11 1106  . promethazine (PHENERGAN) injection 25 mg  25 mg Intramuscular Q6H PRN Marlana Salvage   25 mg at 09/07/11 1205   Current Outpatient Prescriptions  Medication Sig Dispense Refill  . aspirin 81 MG tablet Take 81 mg by mouth daily.        . carvedilol (COREG) 12.5 MG tablet Take 1 tablet (12.5 mg total) by mouth 2 (two) times daily with a meal.  60 tablet  11  .  cyclobenzaprine (FLEXERIL) 5 MG tablet Take 2 tablets (10 mg total) by mouth 3 (three) times daily as needed.  21 tablet  4  . docusate sodium (COLACE) 100 MG capsule Take 1 capsule (100 mg total) by mouth 2 (two) times daily as needed.  30 capsule  11  . EPINEPHrine (EPIPEN) 0.3 MG/0.3ML DEVI One dose Im as needed for severe anaphylaxis.       Marland Kitchen insulin aspart (NOVOLOG) 100 UNIT/ML injection Inject 6 Units into the skin 3 (three) times daily before meals. Dispense as Pen  3 mL  11  .  insulin glargine (LANTUS) 100 UNIT/ML injection Inject 22 Units into the skin every morning. .  10 mL  11  . Insulin Syringe-Needle U-100 (B-D INS SYR ULTRAFINE .3CC/29G) 29G X 1/2" 0.3 ML MISC To be used for prn IM phenergan injections  100 each  11  . lidocaine (LIDODERM) 5 % Place 1 patch onto the skin every 12 (twelve) hours. Remove & Discard patch within 12 hours or as directed by MD  30 patch  11  . lisinopril (ZESTRIL) 2.5 MG tablet Take 1 tablet (2.5 mg total) by mouth daily.  30 tablet  11  . LORazepam (ATIVAN) 1 MG tablet Take 1 tablet (1 mg total) by mouth every 6 (six) hours as needed. For nausea.  30 tablet  3  . metFORMIN (GLUCOPHAGE) 1000 MG tablet Take 1 tablet (1,000 mg total) by mouth 2 (two) times daily with a meal.  60 tablet  11  . mirtazapine (REMERON) 30 MG tablet Take 1 tablet (30 mg total) by mouth at bedtime.  30 tablet  11  . nicotine (NICODERM CQ - DOSED IN MG/24 HR) 7 mg/24hr patch APPLY 1 PATCH DAILY AS NEEDED *REMOVE OLD PATCH*  30 patch  2  . omeprazole (PRILOSEC) 20 MG capsule Take 1 capsule (20 mg total) by mouth daily.  30 capsule  11  . oxyCODONE (ROXICODONE) 5 MG immediate release tablet Take 1 tablet (5 mg total) by mouth every 6 (six) hours as needed for pain. Fill 30 days from date on rx  60 tablet  0  . oxyCODONE (ROXICODONE) 5 MG immediate release tablet Take 1 tablet (5 mg total) by mouth every 6 (six) hours as needed for pain. Fill 60 days from date on rx  60 tablet  0  . polyethylene glycol powder (MIRALAX) powder Take 17 g by mouth daily. Mix packet with water once daily as needed for constipation.  255 g  11  . promethazine (PHENERGAN) 25 MG suppository Place 1 suppository (25 mg total) rectally every 6 (six) hours as needed.  12 each  11  . promethazine (PHENERGAN) 25 MG tablet Take 1 tablet (25 mg total) by mouth every 4 (four) hours. At first sign of nausea.   120 tablet  11  . promethazine (PHENERGAN) 25 MG/ML injection Inject 1 mL (25 mg total) into  the muscle every 6 (six) hours as needed.  10 mL  11  . PROMETHAZINE HCL IM Inject 25 mg (43mL) IM every 6 hours as needed.       . sucralfate (CARAFATE) 1 G tablet Take 1 tablet (1 g total) by mouth 4 (four) times daily -  with meals and at bedtime.  120 tablet  11   Review Of Systems: Per HPI.    Physical Exam: Pulse: 100  Blood Pressure: 189/79 RR: 20   O2: 100 on RA Temp: 97.7  General: distracted and moderate distress.  Actively vomiting while in room.  HEENT: PERRLA, extra ocular movement intact and sclera clear, anicteric Heart: S1, S2 normal, no murmur, rub or gallop, regular rate and rhythm Lungs: clear to auscultation and no wheezes or rales Abdomen: no rebound tenderness, no guarding or rigidity. Diffusely mildly tender to palpation Extremities: extremities normal, atraumatic, no cyanosis or edema Neurology: normal without focal findings. Patient slightly drowsy. Not desiring to talk due to vomiting.   Labs and Imaging: CBC     Status: Abnormal   Collection Time   10/23/11 12:11 PM      Component Value Range   WBC 8.9  4.0 - 10.5 (K/uL)   RBC 3.88  3.87 - 5.11 (MIL/uL)   Hemoglobin 12.4  12.0 - 15.0 (g/dL)   HCT 34.7 (*) 36.0 - 46.0 (%)   MCV 89.4  78.0 - 100.0 (fL)   MCH 32.0  26.0 - 34.0 (pg)   MCHC 35.7  30.0 - 36.0 (g/dL)   RDW 13.0  11.5 - 15.5 (%)   Platelets 243  150 - 400 (K/uL)  COMPREHENSIVE METABOLIC PANEL     Status: Abnormal   Collection Time   10/23/11 12:11 PM      Component Value Range   Sodium 138  135 - 145 (mEq/L)   Potassium 3.8  3.5 - 5.1 (mEq/L)   Chloride 99  96 - 112 (mEq/L)   CO2 24  19 - 32 (mEq/L)   Glucose, Bld 473 (*) 70 - 99 (mg/dL)   BUN 36 (*) 6 - 23 (mg/dL)   Creatinine, Ser 1.84 (*) 0.50 - 1.10 (mg/dL)   Calcium 10.8 (*) 8.4 - 10.5 (mg/dL)   Total Protein 8.4 (*) 6.0 - 8.3 (g/dL)   Albumin 4.4  3.5 - 5.2 (g/dL)   AST 12  0 - 37 (U/L)   ALT 10  0 - 35 (U/L)   Alkaline Phosphatase 111  39 - 117 (U/L)   Total Bilirubin 0.3  0.3 -  1.2 (mg/dL)   GFR calc non Af Amer 32 (*) >90 (mL/min)   GFR calc Af Amer 37 (*) >90 (mL/min)  LIPASE, BLOOD     Status: Normal   Collection Time   10/23/11 12:11 PM      Component Value Range   Lipase 44  11 - 59 (U/L)  URINALYSIS, ROUTINE W REFLEX MICROSCOPIC     Status: Abnormal   Collection Time   10/23/11 12:11 PM      Component Value Range   Color, Urine YELLOW  YELLOW    Appearance CLEAR  CLEAR    Specific Gravity, Urine 1.021  1.005 - 1.030    pH 7.0  5.0 - 8.0    Glucose, UA >1000 (*) NEGATIVE (mg/dL)   Hgb urine dipstick SMALL (*) NEGATIVE    Bilirubin Urine NEGATIVE  NEGATIVE    Ketones, ur NEGATIVE  NEGATIVE (mg/dL)   Protein, ur 100 (*) NEGATIVE (mg/dL)   Urobilinogen, UA 0.2  0.0 - 1.0 (mg/dL)   Nitrite NEGATIVE  NEGATIVE    Leukocytes, UA NEGATIVE  NEGATIVE   URINE MICROSCOPIC-ADD ON     Status: Normal   Collection Time   10/23/11 12:11 PM      Component Value Range   Squamous Epithelial / LPF RARE  RARE    RBC / HPF 0-2  <3 (RBC/hpf)   Dg Abd Acute W/chest  10/23/2011  *RADIOLOGY REPORT*  Clinical Data: Nausea, vomiting  ACUTE ABDOMEN SERIES (ABDOMEN 2 VIEW & CHEST  1 VIEW)  Comparison: 08/14/2011  Findings: Right subclavian port catheter stable.  Vascular clips in the right upper abdomen.  Lungs clear.  Heart size normal.  Small bowel decompressed.  Moderate fecal material in the proximal colon and rectum.  Stable left pelvic phlebolith.  Regional bones unremarkable.  IMPRESSION:  1.  Nonobstructive bowel gas pattern with moderate colonic fecal material. 2.  No free air. 3.  No acute cardiopulmonary disease.  Original Report Authenticated By: Trecia Rogers, M.D.    Assessment and Plan: [name] [name] is a 45 y.o. year old female with history of cyclic vomiting presenting with episode of cyclic vomiting  1. Vomiting- Will start on regimen of zofran and phenergan scheduled with morphine 2mg  IV q2hours and ativan prn. Will start clear diet and advance as  tolerated. Suspect typical cyclical vomiting. Lipase not elevated.  -continue reglan 10mg  54minutes qac and qhs for DM gastroparesis.  -marijuana use may be contributing. Will consult sw for substance abuse counseling.  -patient may benefit from a therapist on outpatient basis as well for cyclic vomiting 2. DMII- Lantus 15 units with moderate SSI, hold metformin. Blood sugars over 400 in ED trending down with hydration and SSI. Last A1c 7.6 on 07/19/11. Will repeat while here. Also consider stopping metformin given CKD.  3. HTN- most likely elevated due to pain and vomiting. Will attempt better control of nausea/vomiting/pain.   will continue current regimen of home meds-coreg 12.5mg  B ID and Lisinopril 2.5.  Will follow blood pressure and consider IV beta blocker or hydralazine if not tolerating PO in AM or pain not controlled with elevated blood pressures.  4. Depression- continue remeron. Believe patient would also benefit from therapist-see #1  5. HLD- continue pravastatin once patient taking PO 6. History of muscle spasms and back pain-will give flexeril 5mg  PO TID prn 7. CKD Stage III-patient Cr  slightly elevated from baseline of 1.5-1.7 but minimally at 1.84. Will hydrate and monitor AM BMET  -FEN/GI: 1/2 NS at 125 ml/hr. Clears advance as tolerated . Will check AM BMET.  -Prophylaxis: heparin 5000 units TID, continue PPI. Continue colace per home medications. Continue Carafate for stomach ulcer ppx. -Disposition: pending clinical improvement. Expect at least a 2 day stay.    Garret Reddish, MD, PGY1 [phone] Dictation # [id number]  [name] [name] R3 note addendum I saw the patient with Dr. Yong Channel and agree with above.  PE: T 97.1, BP 166/96, P 91, R 24, O2 Sat 100% on RA GEN-uncomfortable appearing HEENT- tacky MM, sclera anicteric, nares patent Neck- supple, no thyromegaly CV- RRR no murmur Lung- CTAB no crackles or wheezes ABD- diffusely tender, BS positive, no masses Ext- warm,  no edema Neuro- moving all 4 extremities, no facial droop, normal speech, no photophobia or neck stiffness-vomitting during exam  A/P 45 yo AA F with DM gastroparesis presenting with an exacerbation of cyclic vomiting 1.Cyclic vomiting- no new symptoms this hospitalization.  Will plan to give morphine and ativan PRN and Zofran and phenergan scheduled.  Will hydrate with  NS at 129ml/hour while nauseous.  Plan to start diet at clears and AAT.   2. DM- will reduce lantus to half dose at 15 units.  Will start moderate SSI.  No metformin while vomiting and Cr mildly elevated. 3. HTN- currently elevated due to pain and nausea.  Will treat those symptoms first, then reevaluate BP.  Plan to place on telemetry.  Continue lisinopril and coreg per home regimen. 4. A on CKD- mild  elevation of Cr.  Baseline 1.5-1.6.  will hydrate and monitor.   5. THC use- plan to have SW to see pt to discuss drug use.  This may trigger her nausea.   6.Depression/anxiety- currently on remeron.  This has not helped as much as I would like.  Pt would likely benefit from counseling in addition.  7. FEN/GI-  NS at 148ml/hr.  clears to carb mod diet.  Continue colace for mild constipation.   8. PPX-heparin 5000 units SQ TID and protonix. 9. Dispo- pending clinical improvement.  Pt usually has a 2 day stay.

## 2011-10-24 DIAGNOSIS — R1115 Cyclical vomiting syndrome unrelated to migraine: Secondary | ICD-10-CM

## 2011-10-24 DIAGNOSIS — E118 Type 2 diabetes mellitus with unspecified complications: Secondary | ICD-10-CM

## 2011-10-24 LAB — CBC
HCT: 34 % — ABNORMAL LOW (ref 36.0–46.0)
MCHC: 34.1 g/dL (ref 30.0–36.0)
MCV: 90.9 fL (ref 78.0–100.0)
Platelets: 209 10*3/uL (ref 150–400)
RDW: 13.2 % (ref 11.5–15.5)
WBC: 10 10*3/uL (ref 4.0–10.5)

## 2011-10-24 LAB — HEMOGLOBIN A1C: Hgb A1c MFr Bld: 9.5 % — ABNORMAL HIGH (ref <5.7)

## 2011-10-24 LAB — GLUCOSE, CAPILLARY
Glucose-Capillary: 269 mg/dL — ABNORMAL HIGH (ref 70–99)
Glucose-Capillary: 146 mg/dL — ABNORMAL HIGH (ref 70–99)
Glucose-Capillary: 272 mg/dL — ABNORMAL HIGH (ref 70–99)
Glucose-Capillary: 242 mg/dL — ABNORMAL HIGH (ref 70–99)

## 2011-10-25 LAB — COMPREHENSIVE METABOLIC PANEL
Albumin: 3.3 g/dL — ABNORMAL LOW (ref 3.5–5.2)
Alkaline Phosphatase: 78 U/L (ref 39–117)
BUN: 29 mg/dL — ABNORMAL HIGH (ref 6–23)
Calcium: 8.8 mg/dL (ref 8.4–10.5)
Creatinine, Ser: 1.74 mg/dL — ABNORMAL HIGH (ref 0.50–1.10)
GFR calc Af Amer: 39 mL/min — ABNORMAL LOW (ref >90)
Glucose, Bld: 304 mg/dL — ABNORMAL HIGH (ref 70–99)
Total Protein: 6.6 g/dL (ref 6.0–8.3)

## 2011-10-25 LAB — CBC
HCT: 31.1 % — ABNORMAL LOW (ref 36.0–46.0)
Hemoglobin: 10.8 g/dL — ABNORMAL LOW (ref 12.0–15.0)
MCH: 31.4 pg (ref 26.0–34.0)
MCHC: 34.7 g/dL (ref 30.0–36.0)
MCV: 90.4 fL (ref 78.0–100.0)
RDW: 13.1 % (ref 11.5–15.5)

## 2011-10-25 LAB — GLUCOSE, CAPILLARY
Glucose-Capillary: 135 mg/dL — ABNORMAL HIGH (ref 70–99)
Glucose-Capillary: 159 mg/dL — ABNORMAL HIGH (ref 70–99)
Glucose-Capillary: 190 mg/dL — ABNORMAL HIGH (ref 70–99)
Glucose-Capillary: 256 mg/dL — ABNORMAL HIGH (ref 70–99)
Glucose-Capillary: 275 mg/dL — ABNORMAL HIGH (ref 70–99)

## 2011-10-25 NOTE — Progress Notes (Signed)
Family Medicine Teaching Service Intern Progress Note  Subjective: nausea much improved. No vomiting. Still with some pain but much improved. Patient would like to go home before noon today.   Objective:  T97.8 HR: 84 RR: 18  BP: 135/75  SaO2: >98 on RA   CBGs 139-272 with AM 25, repeat >100 General: Patient extremely comfortable and talkative compared to admission date.  HEENT: MMM, PERRLA Heart: S1, S2 normal, no murmur, rub or gallop, regular rate and rhythm  Lungs: clear to auscultation and no wheezes or rales  Abdomen: no rebound tenderness, no guarding or rigidity. Diffusely mildly tender to palpation worse in epigastric region Extremities: extremities normal, atraumatic, no cyanosis or edema  Neurology: normal without focal findings.   Labs and imaging:  HEMOGLOBIN A1C     Status: Abnormal   Collection Time   10/24/11  8:59 AM      Component Value Range   Hemoglobin A1C 9.5 (*) <5.7 (%)   Mean Plasma Glucose 226 (*) <117 (mg/dL)  CBC     Status: Abnormal   Collection Time   10/25/11  5:00 AM      Component Value Range   WBC 8.6  4.0 - 10.5 (K/uL)   RBC 3.44 (*) 3.87 - 5.11 (MIL/uL)   Hemoglobin 10.8 (*) 12.0 - 15.0 (g/dL)   HCT 31.1 (*) 36.0 - 46.0 (%)   MCV 90.4  78.0 - 100.0 (fL)   MCH 31.4  26.0 - 34.0 (pg)   MCHC 34.7  30.0 - 36.0 (g/dL)   RDW 13.1  11.5 - 15.5 (%)   Platelets 183  150 - 400 (K/uL)  COMPREHENSIVE METABOLIC PANEL     Status: Abnormal   Collection Time   10/25/11  5:00 AM      Component Value Range   Sodium 131 (*) 135 - 145 (mEq/L)   Potassium 3.7  3.5 - 5.1 (mEq/L)   Chloride 97  96 - 112 (mEq/L)   CO2 23  19 - 32 (mEq/L)   Glucose, Bld 304 (*) 70 - 99 (mg/dL)   BUN 29 (*) 6 - 23 (mg/dL)   Creatinine, Ser 1.74 (*) 0.50 - 1.10 (mg/dL)   Calcium 8.8  8.4 - 10.5 (mg/dL)   Total Protein 6.6  6.0 - 8.3 (g/dL)   Albumin 3.3 (*) 3.5 - 5.2 (g/dL)   AST 13  0 - 37 (U/L)   ALT 9  0 - 35 (U/L)   Alkaline Phosphatase 78  39 - 117 (U/L)   Total  Bilirubin 0.2 (*) 0.3 - 1.2 (mg/dL)   GFR calc non Af Amer 34 (*) >90 (mL/min)   GFR calc Af Amer 39 (*) >90 (mL/min)      Assessment  [name] [name] is a 45 y.o. year old female with history of cyclic vomiting presenting with episode of cyclic vomiting  1. Vomiting- drastically improved on zofran, reglan,  and phenergan scheduled with prn dilauidid and ativan. Typical cyclical vomiting for patient. Can discharge on home meds.  -drastic improvement, patient would like to be discharged today.  -marijuana use may be contributing. Will consult sw for substance abuse counseling before d/c -patient may benefit from a therapist on outpatient basis as well for cyclic vomiting  2. DMII- Lantus 15 units with moderate SSI, hold metformin in house. A1c up over 2 points from previous. Will need close outpatient follow up. Will hold metformin at d/c due to Cr/CKD with decision to be made on outpatient basis.  Patient  with 1 episode of hypoglycemia o/n with repeat >100.  3. HTN- well controlled when not in pain/vomiting. continue current regimen of home meds-coreg 12.5mg  B ID and Lisinopril 2.5.   4. Depression- continue remeron. Believe patient would also benefit from therapist-see #1  5. HLD- continue pravastatin at d/c 6. History of muscle spasms and back pain-given flexeril 5mg  PO TID prn in house 7. CKD Stage III-patient Cr slightly elevated from baseline of 1.5-1.7 but minimally at 1.74, slightly down with hydration  -FEN/GI: hyponatremic due to  1/2 NS at 125 ml/hr-will SLIV. CHO modified  -Prophylaxis: heparin 5000 units TID, continue PPI. Continue colace per home medications. Continue Carafate for stomach ulcer ppx.  -Disposition: will d/c home today.    Garret Reddish, MD PGY1, Family Medicine Teaching Service [phone]

## 2011-10-25 NOTE — H&P (Signed)
NAME:  [name] [name], [name] [name] NO.:  [id number]  MEDICAL RECORD NO.:  [id number]  LOCATION:  [facility]                         FACILITY:  [facility]  PHYSICIAN:  Dalbert Mayotte, MD        DATE OF BIRTH:  1966-08-13  DATE OF ADMISSION:  10/23/2011 DATE OF DISCHARGE:                             HISTORY & PHYSICAL   PRIMARY CARE PROVIDER:  Marlana Salvage, MD, of [facility].  CHIEF COMPLAINT:  Nausea, vomiting, abdominal pain.  HISTORY OF PRESENT ILLNESS:  [name] [name] is a 45 y.o. year old female well known to the family medicine service with DM gastroparesis and cyclical vomiting presenting with persistent vomiting.   Patient seen in clinic on 10/24 with  with intractable nausea and vomiting starting that AM worsening from when she was seen in ED over weekend. Grill smoke as potential trigger. Patient has been given IM phenergan, IM morphine, and IM ativan regimen in  clinic up to BID to reduce admissions. She received this regimen in clinic and her symptoms improved.     She has felt poorly since being seen in clinic though with intractable nausea/vomiting starting again this morning. No fevers or recent sick contacts. Patient with some mild chest discomfort when vomiting. Also with mild headache. Patient says nothing  different from previous episodes. Very small amount of blood on 1 emesis earlier today.   No known triggers for this current event. Marijuana use reported but 2 weeks ago as patient states couldn't afford currently. Patient seen in ED and received IV  zofran, pepcid, dilaudid, reglan, ativan, and GI cocktail. She also received 2L of IVF. Her nausea vomiting persisted so family medicine was consulted to admit patient for further management.     PAST MEDICAL HISTORY: 1. Diabetic neuropathy. 2. Diabetes mellitus with complications. 3. Depression. 4. Anxiety. 5. Tobacco dependence. 6. Migraine. 7. Hypertension. 8. GERD. 9. Back pain  chronic. 10.Insomnia not otherwise specified. 11.Cyclical vomiting. 12.Diabetic gastroparesis.  PAST SURGICAL HISTORY: 1. Port-A-Cath placement. 2. Cholecystectomy. 3. Abdominal hysterectomy. 4. Laparotomy and lysis of adhesions.  SOCIAL HISTORY:  The patient is single.  She smokes tobacco occasionally.  She also uses marijuana occasionally.  FAMILY HISTORY:  Heart attack in mother at age 73.  ALLERGIES:  ERYTHROMYCIN, nausea and vomiting.  PERCOCET itching.  OUTPATIENT MEDICATIONS: 1. Aspirin 81 mg daily. 2. Coreg 12.5 mg b.i.d. 3. Flexeril 5 mg 2 tablets by mouth 3 times daily as needed. 4. Colace 100 mg 2 times daily as needed. 5. EpiPen. 6. Insulin aspart inject 6 unit into the skin 3 times daily before     meals. 7. Lantus inject 20 units into the skin every morning. 8. Lisinopril 2.5 mg daily. 9. Ativan 1 mg by mouth every 6 hours as needed. 10.Metformin 1000 mg take 2 times daily. 11.Remeron 30 mg by mouth at bedtime. 12.Omeprazole 20 mg by mouth daily. 13.Oxycodone take 1 tab by mouth every 6 hours as needed for pain. 14.MiraLax 17 g by mouth daily. 15.Promethazine place 1 suppository 25 mg rectally every 6 hours as     needed, or can be used p.o. or IM. 16.Carafate take 1 g by mouth  4 times daily with meals and at bedtime.  REVIEW OF SYSTEMS:  Per HPI.  PHYSICAL EXAMINATION:  VITAL SIGNS:  Pulse 100, blood pressure 189/79, respiratory rate 20, O2 100 on room air, temperature 97.7. GENERAL:  Distracted in moderate distress, actively vomiting while in the room. HEENT:  Pupils are equal, round, and reactive to light and accommodation.  Extraocular movement intact and sclera clear, anicteric. HEART:  S1, S2 normal.  No murmur, rub or gallop.  Regular rate and rhythm. LUNGS:  Clear to auscultation.  No wheezes or rales. ABDOMEN:  No rebound tenderness.  No guarding or rigidity.  Diffusely mildly tender to palpation. EXTREMITIES:  Normal atraumatic.  No cyanosis  or edema. NEUROLOGY:  Normal without focal findings.  The patient is slightly drowsy.  LABS AND IMAGING: 1. CBC; white count 8.9, hemoglobin 12.4, platelets 243. 2. CMP; 138, 3.8, 99, 24, 473, 36, 1.84, 10.8, 8.4, 4.4, 12, 10, 111,     0.3.  Please note creatinine 1.84 and BUN 36. 3. Lipase 44. 4. Urinalysis with glucose greater than 1000 and small hemoglobin     urine dipstick and 100 protein. 5. Urine microscopic had on rare squamous cells, 0-2 rbcs. 6. Abdominal x-ray with nonobstructive bowel gas pattern with moderate     colonic fecal material.  No free air.  No acute cardiopulmonary     disease.  ASSESSMENT AND PLAN:  [name] [name] is a 45 y.o. year old female with history of cyclic vomiting presenting with episode of cyclic vomiting   1. Vomiting- Will start on regimen of zofran and phenergan scheduled with morphine 2mg  IV q2hours and ativan prn. Will start clear diet and advance as tolerated. Suspect typical cyclical vomiting. Lipase not elevated.  -continue reglan 10mg  88minutes qac and qhs for DM gastroparesis.   -marijuana use may be contributing. Will consult sw for substance abuse counseling.   -patient may benefit from a therapist on outpatient basis as well for cyclic vomiting 2. DMII- Lantus 15 units with moderate SSI, hold metformin. Blood sugars over 400 in ED trending down with hydration and SSI. Last A1c 7.6 on 07/19/11. Will repeat while here. Also consider stopping metformin given CKD.   3. HTN- most likely elevated due to pain and vomiting. Will attempt better control of nausea/vomiting/pain.   will continue current regimen of home meds-coreg 12.5mg  B ID and Lisinopril 2.5.  Will follow blood pressure and consider IV beta blocker or  hydralazine if not tolerating PO in AM or pain not controlled with elevated blood pressures.   4. Depression- continue remeron. Believe patient would also benefit from therapist-see #1   5. HLD- continue pravastatin once patient taking  PO 6. History of muscle spasms and back pain-will give flexeril 5mg  PO TID prn 7. CKD Stage III-patient Cr  slightly elevated from baseline of 1.5-1.7 but minimally at 1.84. Will hydrate and monitor AM BMET   -FEN/GI: 1/2 NS at 125 ml/hr. Clears advance as tolerated . Will check AM BMET.   -Prophylaxis: heparin 5000 units TID, continue PPI. Continue colace per home medications. Continue Carafate for stomach ulcer ppx. -Disposition: pending clinical improvement. Expect at least a 2 day stay.      ______________________________ Garret Reddish, MD   ______________________________ Dalbert Mayotte, MD    SH/MEDQ  D:  10/23/2011  T:  10/23/2011  Job:  [id number]  Electronically Signed by Garret Reddish MD on 10/25/2011 04:12:22 PM Electronically Signed by Dalbert Mayotte MD on 10/25/2011 04:27:21 PM

## 2011-10-26 LAB — BASIC METABOLIC PANEL
BUN: 25 mg/dL — ABNORMAL HIGH (ref 6–23)
Creatinine, Ser: 1.69 mg/dL — ABNORMAL HIGH (ref 0.50–1.10)
GFR calc Af Amer: 41 mL/min — ABNORMAL LOW (ref >90)
GFR calc non Af Amer: 35 mL/min — ABNORMAL LOW (ref >90)
Potassium: 3.7 mEq/L (ref 3.5–5.1)

## 2011-10-26 LAB — CBC
HCT: 29.8 % — ABNORMAL LOW (ref 36.0–46.0)
MCHC: 34.6 g/dL (ref 30.0–36.0)
Platelets: 180 10*3/uL (ref 150–400)
RDW: 12.9 % (ref 11.5–15.5)

## 2011-10-26 LAB — GLUCOSE, CAPILLARY: Glucose-Capillary: 158 mg/dL — ABNORMAL HIGH (ref 70–99)

## 2011-10-26 NOTE — Progress Notes (Signed)
Family Medicine Teaching Service Intern Progress Note  Subjective: no abdominal pain or nausea. Wants to go home.   Objective:  T98.7 HR: 72 RR: 18  BP: 155/90  SaO2: >98 on RA   CBGs 135-256 no hypoglycemia, 2 units SSI, 15 units lantus last night with AM 158 today General: Patient extremely comfortable and talkative compared to admission date.  HEENT: MMM, PERRLA Heart: S1, S2 normal, no murmur, rub or gallop, regular rate and rhythm  Lungs: clear to auscultation and no wheezes or rales  Abdomen: no rebound tenderness, no guarding or rigidity. Diffusely mildly tender to palpation worse in epigastric region Extremities: extremities normal, atraumatic, no cyanosis or edema  Neurology: normal without focal findings.   Labs and imaging:  CBC     Status: Abnormal   Collection Time   10/26/11  5:40 AM      Component Value Range   WBC 5.6  4.0 - 10.5 (K/uL)   RBC 3.25 (*) 3.87 - 5.11 (MIL/uL)   Hemoglobin 10.3 (*) 10.8 prev 12.0 - 15.0 (g/dL)   HCT 29.8 (*) 36.0 - 46.0 (%)   MCV 91.7  78.0 - 100.0 (fL)   MCH 31.7  26.0 - 34.0 (pg)   MCHC 34.6  30.0 - 36.0 (g/dL)   RDW 12.9  11.5 - 15.5 (%)   Platelets 180  150 - 400 (K/uL)  BASIC METABOLIC PANEL     Status: Abnormal   Collection Time   10/26/11  5:40 AM      Component Value Range   Sodium 138  135 - 145 (mEq/L)   Potassium 3.7  3.5 - 5.1 (mEq/L)   Chloride 103  96 - 112 (mEq/L)   CO2 25  19 - 32 (mEq/L)   Glucose, Bld 209 (*) 70 - 99 (mg/dL)   BUN 25 (*) 6 - 23 (mg/dL)   Creatinine, Ser 1.69 (*)  1.74 prev 0.50 - 1.10 (mg/dL)   Calcium 9.4  8.4 - 10.5 (mg/dL)   GFR calc non Af Amer 35 (*) >90 (mL/min)   GFR calc Af Amer 41 (*) >90 (mL/min)       Assessment  TIKEYA VAUX is a 45 y.o. year old female with history of cyclic vomiting presenting with episode of cyclic vomiting  1. Vomiting- drastically improved on zofran, reglan,  and phenergan scheduled with prn dilauidid and ativan. Typical cyclical vomiting for patient.  Can discharge on home meds.  -drastic improvement, patient would like to be discharged today.  -marijuana use may be contributing. Has resources for quitting marijuana.  -has outpatient therapist 30 minutes per week. ng  2. DMII- Lantus 15 units with moderate SSI, hold metformin in house. A1c up over 2 points from previous. Will need close outpatient follow up. Will hold metformin at d/c due to Cr/CKD with decision to be made on outpatient basis.  Patient with 1 episode of hypoglycemia o/n with repeat >100.  3. HTN- well controlled when not in pain/vomiting. continue current regimen of home meds-coreg 12.5mg  B ID and Lisinopril 2.5.  Slightly elevated this AM. Outpatient follow up.  4. Depression- continue remeron. Believe patient would also benefit from therapist-see #1  5. HLD- continue pravastatin at d/c 6. History of muscle spasms and back pain-given flexeril 5mg  PO TID prn in house 7. CKD Stage III-patient Cr slightly elevated from baseline of 1.5-1.7 but minimally at 1.69, slightly down with hydration  -FEN/GI: hyponatremic due to  1/2 NS at 125 ml/hr-resolved with SLIV. CHO modified  -  Prophylaxis: heparin 5000 units TID, continue PPI. Continue colace per home medications. Continue Carafate for stomach ulcer ppx.  -Disposition: will d/c home today.    Garret Reddish, MD PGY1, Family Medicine Teaching Service [phone]   HPI  Review of Systems  Physical Exam

## 2011-10-26 NOTE — Discharge Summary (Signed)
Physician Discharge Summary  Patient ID: [name] [name] [id number] [birth year] 45 y.o.  Admit date: 10/23/2011 Discharge date: 10/26/2011  PCP: Marlana Salvage, MD of [name]   Discharge Diagnosis: Primary 1. Cyclical Vomiting  Secondary 1. Marijuana abuse  2. Diabetes mellitus with complications including neuropathy  3. Depression.   4. Anxiety.   5. Tobacco dependence.   6. Migraine.   7. Hypertension.   8. GERD.   9. Back pain chronic.   10.Insomnia not otherwise specified.   11.Cyclical vomiting.   12.Diabetic gastroparesis.   13. S/p  Port-A-Cath placement.   14. s/p Cholecystectomy.   15. s/p Abdominal hysterectomy.   16. s/p Laparotomy and lysis of adhesions.   Hospital Course [name] [name] is a 45 y.o. year old female with history of cyclic vomiting who presented with episode of cyclic vomiting  1. Vomiting- drastically improved on zofran, reglan, and phenergan scheduled with prn dilauidid and ativan. Typical cyclical vomiting for patient. discharged on home meds.  -marijuana use may be contributing. Has resources for quitting marijuana from social work -has outpatient therapist 30 minutes per week. Might benefit from more time as many social stressors -Of note, patient's son recently returned to live with her after living separately for many years and being in jail  2. DMII- Lantus 15 units with moderate SSI, hold metformin in house. A1c up over 2 points from previous. Will need close outpatient follow up. Will hold metformin at d/c due to Cr/CKD with decision to be made on outpatient basis.  3. HTN- well controlled when not in pain/vomiting. continude current regimen of home meds-coreg 12.5mg  B ID and Lisinopril 2.5.   4. Depression- continue remeron. Believe patient would also benefit from therapist-see #1. Patient would benefit from SSRI but wanted to think about it before starting on one.  5. HLD- continue pravastatin at d/c  6. History of  muscle spasms and back pain-given flexeril 5mg  PO TID prn in house. Patient thinks may contribute to vomiting.  7. CKD Stage III-patient Cr slightly elevated from baseline of 1.5-1.7 at admission but at discharge was at 1.69.      Procedures/Imaging:  No results found.  Labs  Results for orders placed during the hospital encounter of 10/23/11 (from the past 72 hour(s))  DIFFERENTIAL     Status: Normal   Collection Time   10/23/11 12:11 PM      Component Value Range Comment   Neutrophils Relative 66  43 - 77 (%)    Neutro Abs 5.9  1.7 - 7.7 (K/uL)    Lymphocytes Relative 27  12 - 46 (%)    Lymphs Abs 2.4  0.7 - 4.0 (K/uL)    Monocytes Relative 6  3 - 12 (%)    Monocytes Absolute 0.5  0.1 - 1.0 (K/uL)    Eosinophils Relative 1  0 - 5 (%)    Eosinophils Absolute 0.1  0.0 - 0.7 (K/uL)    Basophils Relative 0  0 - 1 (%)    Basophils Absolute 0.0  0.0 - 0.1 (K/uL)   CBC     Status: Abnormal   Collection Time   10/23/11 12:11 PM      Component Value Range Comment   WBC 8.9  4.0 - 10.5 (K/uL)    RBC 3.88  3.87 - 5.11 (MIL/uL)    Hemoglobin 12.4  12.0 - 15.0 (g/dL)    HCT 34.7 (*) 36.0 - 46.0 (%)    MCV 89.4  78.0 -  100.0 (fL)    MCH 32.0  26.0 - 34.0 (pg)    MCHC 35.7  30.0 - 36.0 (g/dL)    RDW 13.0  11.5 - 15.5 (%)    Platelets 243  150 - 400 (K/uL)   COMPREHENSIVE METABOLIC PANEL     Status: Abnormal   Collection Time   10/23/11 12:11 PM      Component Value Range Comment   Sodium 138  135 - 145 (mEq/L)    Potassium 3.8  3.5 - 5.1 (mEq/L)    Chloride 99  96 - 112 (mEq/L)    CO2 24  19 - 32 (mEq/L)    Glucose, Bld 473 (*) 70 - 99 (mg/dL)    BUN 36 (*) 6 - 23 (mg/dL)    Creatinine, Ser 1.84 (*) 0.50 - 1.10 (mg/dL)    Calcium 10.8 (*) 8.4 - 10.5 (mg/dL)    Total Protein 8.4 (*) 6.0 - 8.3 (g/dL)    Albumin 4.4  3.5 - 5.2 (g/dL)    AST 12  0 - 37 (U/L)    ALT 10  0 - 35 (U/L)    Alkaline Phosphatase 111  39 - 117 (U/L)    Total Bilirubin 0.3  0.3 - 1.2 (mg/dL)    GFR calc  non Af Amer 32 (*) >90 (mL/min)    GFR calc Af Amer 37 (*) >90 (mL/min)   LIPASE, BLOOD     Status: Normal   Collection Time   10/23/11 12:11 PM      Component Value Range Comment   Lipase 44  11 - 59 (U/L)   URINALYSIS, ROUTINE W REFLEX MICROSCOPIC     Status: Abnormal   Collection Time   10/23/11 12:11 PM      Component Value Range Comment   Color, Urine YELLOW  YELLOW     Appearance CLEAR  CLEAR     Specific Gravity, Urine 1.021  1.005 - 1.030     pH 7.0  5.0 - 8.0     Glucose, UA >1000 (*) NEGATIVE (mg/dL)    Hgb urine dipstick SMALL (*) NEGATIVE     Bilirubin Urine NEGATIVE  NEGATIVE     Ketones, ur NEGATIVE  NEGATIVE (mg/dL)    Protein, ur 100 (*) NEGATIVE (mg/dL)    Urobilinogen, UA 0.2  0.0 - 1.0 (mg/dL)    Nitrite NEGATIVE  NEGATIVE     Leukocytes, UA NEGATIVE  NEGATIVE    URINE MICROSCOPIC-ADD ON     Status: Normal   Collection Time   10/23/11 12:11 PM      Component Value Range Comment   Squamous Epithelial / LPF RARE  RARE     RBC / HPF 0-2  <3 (RBC/hpf)   GLUCOSE, CAPILLARY     Status: Abnormal   Collection Time   10/23/11  4:25 PM      Component Value Range Comment   Glucose-Capillary 360 (*) 70 - 99 (mg/dL)    Comment 1 Documented in Chart      Comment 2 Notify RN     GLUCOSE, CAPILLARY     Status: Abnormal   Collection Time   10/23/11  5:42 PM      Component Value Range Comment   Glucose-Capillary 313 (*) 70 - 99 (mg/dL)   GLUCOSE, CAPILLARY     Status: Abnormal   Collection Time   10/23/11  8:12 PM      Component Value Range Comment   Glucose-Capillary 252 (*) 70 - 99 (  mg/dL)    Comment 1 Notify RN     GLUCOSE, CAPILLARY     Status: Abnormal   Collection Time   10/23/11 11:48 PM      Component Value Range Comment   Glucose-Capillary 350 (*) 70 - 99 (mg/dL)   GLUCOSE, CAPILLARY     Status: Abnormal   Collection Time   10/24/11  2:48 AM      Component Value Range Comment   Glucose-Capillary 269 (*) 70 - 99 (mg/dL)   CBC     Status: Abnormal    Collection Time   10/24/11  6:50 AM      Component Value Range Comment   WBC 10.0  4.0 - 10.5 (K/uL)    RBC 3.74 (*) 3.87 - 5.11 (MIL/uL)    Hemoglobin 11.6 (*) 12.0 - 15.0 (g/dL)    HCT 34.0 (*) 36.0 - 46.0 (%)    MCV 90.9  78.0 - 100.0 (fL)    MCH 31.0  26.0 - 34.0 (pg)    MCHC 34.1  30.0 - 36.0 (g/dL)    RDW 13.2  11.5 - 15.5 (%)    Platelets 209  150 - 400 (K/uL)   GLUCOSE, CAPILLARY     Status: Abnormal   Collection Time   10/24/11  8:25 AM      Component Value Range Comment   Glucose-Capillary 222 (*) 70 - 99 (mg/dL)    Comment 1 Notify RN     HEMOGLOBIN A1C     Status: Abnormal   Collection Time   10/24/11  8:59 AM      Component Value Range Comment   Hemoglobin A1C 9.5 (*) <5.7 (%)    Mean Plasma Glucose 226 (*) <117 (mg/dL)   GLUCOSE, CAPILLARY     Status: Abnormal   Collection Time   10/24/11 12:14 PM      Component Value Range Comment   Glucose-Capillary 146 (*) 70 - 99 (mg/dL)    Comment 1 Notify RN     GLUCOSE, CAPILLARY     Status: Abnormal   Collection Time   10/24/11  4:59 PM      Component Value Range Comment   Glucose-Capillary 149 (*) 70 - 99 (mg/dL)    Comment 1 Notify RN     GLUCOSE, CAPILLARY     Status: Abnormal   Collection Time   10/24/11  8:12 PM      Component Value Range Comment   Glucose-Capillary 272 (*) 70 - 99 (mg/dL)   GLUCOSE, CAPILLARY     Status: Abnormal   Collection Time   10/24/11 10:11 PM      Component Value Range Comment   Glucose-Capillary 242 (*) 70 - 99 (mg/dL)   GLUCOSE, CAPILLARY     Status: Abnormal   Collection Time   10/24/11 11:56 PM      Component Value Range Comment   Glucose-Capillary 159 (*) 70 - 99 (mg/dL)   GLUCOSE, CAPILLARY     Status: Abnormal   Collection Time   10/25/11  2:09 AM      Component Value Range Comment   Glucose-Capillary 25 (*) 70 - 99 (mg/dL)    Comment 1 Notify RN     GLUCOSE, CAPILLARY     Status: Abnormal   Collection Time   10/25/11  2:31 AM      Component Value Range Comment    Glucose-Capillary 139 (*) 70 - 99 (mg/dL)   CBC     Status: Abnormal   Collection  Time   10/25/11  5:00 AM      Component Value Range Comment   WBC 8.6  4.0 - 10.5 (K/uL)    RBC 3.44 (*) 3.87 - 5.11 (MIL/uL)    Hemoglobin 10.8 (*) 12.0 - 15.0 (g/dL)    HCT 31.1 (*) 36.0 - 46.0 (%)    MCV 90.4  78.0 - 100.0 (fL)    MCH 31.4  26.0 - 34.0 (pg)    MCHC 34.7  30.0 - 36.0 (g/dL)    RDW 13.1  11.5 - 15.5 (%)    Platelets 183  150 - 400 (K/uL)   COMPREHENSIVE METABOLIC PANEL     Status: Abnormal   Collection Time   10/25/11  5:00 AM      Component Value Range Comment   Sodium 131 (*) 135 - 145 (mEq/L) DELTA CHECK NOTED   Potassium 3.7  3.5 - 5.1 (mEq/L)    Chloride 97  96 - 112 (mEq/L)    CO2 23  19 - 32 (mEq/L)    Glucose, Bld 304 (*) 70 - 99 (mg/dL)    BUN 29 (*) 6 - 23 (mg/dL)    Creatinine, Ser 1.74 (*) 0.50 - 1.10 (mg/dL)    Calcium 8.8  8.4 - 10.5 (mg/dL)    Total Protein 6.6  6.0 - 8.3 (g/dL)    Albumin 3.3 (*) 3.5 - 5.2 (g/dL)    AST 13  0 - 37 (U/L)    ALT 9  0 - 35 (U/L)    Alkaline Phosphatase 78  39 - 117 (U/L)    Total Bilirubin 0.2 (*) 0.3 - 1.2 (mg/dL)    GFR calc non Af Amer 34 (*) >90 (mL/min)    GFR calc Af Amer 39 (*) >90 (mL/min)   GLUCOSE, CAPILLARY     Status: Abnormal   Collection Time   10/25/11  7:52 AM      Component Value Range Comment   Glucose-Capillary 275 (*) 70 - 99 (mg/dL)   GLUCOSE, CAPILLARY     Status: Abnormal   Collection Time   10/25/11 12:15 PM      Component Value Range Comment   Glucose-Capillary 190 (*) 70 - 99 (mg/dL)   GLUCOSE, CAPILLARY     Status: Abnormal   Collection Time   10/25/11  4:44 PM      Component Value Range Comment   Glucose-Capillary 135 (*) 70 - 99 (mg/dL)   GLUCOSE, CAPILLARY     Status: Abnormal   Collection Time   10/25/11  9:44 PM      Component Value Range Comment   Glucose-Capillary 256 (*) 70 - 99 (mg/dL)   CBC     Status: Abnormal   Collection Time   10/26/11  5:40 AM      Component Value Range  Comment   WBC 5.6  4.0 - 10.5 (K/uL)    RBC 3.25 (*) 3.87 - 5.11 (MIL/uL)    Hemoglobin 10.3 (*) 12.0 - 15.0 (g/dL)    HCT 29.8 (*) 36.0 - 46.0 (%)    MCV 91.7  78.0 - 100.0 (fL)    MCH 31.7  26.0 - 34.0 (pg)    MCHC 34.6  30.0 - 36.0 (g/dL)    RDW 12.9  11.5 - 15.5 (%)    Platelets 180  150 - 400 (K/uL)   BASIC METABOLIC PANEL     Status: Abnormal   Collection Time   10/26/11  5:40 AM      Component  Value Range Comment   Sodium 138  135 - 145 (mEq/L) DELTA CHECK NOTED   Potassium 3.7  3.5 - 5.1 (mEq/L)    Chloride 103  96 - 112 (mEq/L)    CO2 25  19 - 32 (mEq/L)    Glucose, Bld 209 (*) 70 - 99 (mg/dL)    BUN 25 (*) 6 - 23 (mg/dL)    Creatinine, Ser 1.69 (*) 0.50 - 1.10 (mg/dL)    Calcium 9.4  8.4 - 10.5 (mg/dL)    GFR calc non Af Amer 35 (*) >90 (mL/min)    GFR calc Af Amer 41 (*) >90 (mL/min)   GLUCOSE, CAPILLARY     Status: Abnormal   Collection Time   10/26/11  7:51 AM      Component Value Range Comment   Glucose-Capillary 158 (*) 70 - 99 (mg/dL)        Patient condition at time of discharge/disposition: stable  Disposition-home   Follow up issues: 1. Follow up vomiting 2. Counseling/therapist- Might benefit from more time as many social stressors Of note, patient's son recently returned to live with her after living separately for many years and being in jail  3. Patient would benefit from SSRI but wanted to think about it before starting on one. Discuss at next appointment-consider zoloft.  4. A1c up over 2 points from previous. Will need close outpatient follow up. Will hold metformin at d/c due to Cr/CKD with baseline CR 1.5-1.7 with decision to be made on outpatient basis.    Discharge follow up:  Discharge Orders    Future Appointments: Provider: Department: Dept Phone: Center:   11/03/2011 3:30 PM Garret Reddish, MD [facility] [phone] [facility]      Discharge Medications Reglan 10mg  TID with meals and at bedtime PO Carafate 1g before meals and at  bedtime Zofran 8mg  1 capsule by mouth q8hours for nausea Aspirin 81 mg once daily Carvedilol 12.5mg  1 tablet by mouth BID Colace 100mg  BID Flexeril 5mg  one tablet by mouth TID prn for back spasms Epipen for bee sting lantus 22 units subcutaneously every morning Insulin aspart 6 units sq daily Lisinopril 2.5mg  daily Lorazepam 1mg  PO q6hours for anxiety Omeprazole 20mg  daily Oxycodone 5 mg q6hours prn pain phenergan 25mg  q4 hours for nausea also has rectal suppositories.  Renee Ramus, MD 10/26/2011 9:50 AM

## 2011-10-27 ENCOUNTER — Encounter (HOSPITAL_COMMUNITY): Payer: Self-pay | Admitting: Family Medicine

## 2011-11-03 ENCOUNTER — Ambulatory Visit (INDEPENDENT_AMBULATORY_CARE_PROVIDER_SITE_OTHER): Payer: Medicare Other | Admitting: Family Medicine

## 2011-11-03 ENCOUNTER — Encounter: Payer: Self-pay | Admitting: Family Medicine

## 2011-11-03 VITALS — BP 168/88 | HR 72 | Temp 98.1°F | Ht 61.0 in | Wt 177.0 lb

## 2011-11-03 DIAGNOSIS — F1210 Cannabis abuse, uncomplicated: Secondary | ICD-10-CM

## 2011-11-03 DIAGNOSIS — R1115 Cyclical vomiting syndrome unrelated to migraine: Secondary | ICD-10-CM

## 2011-11-03 DIAGNOSIS — F1290 Cannabis use, unspecified, uncomplicated: Secondary | ICD-10-CM | POA: Insufficient documentation

## 2011-11-03 DIAGNOSIS — I10 Essential (primary) hypertension: Secondary | ICD-10-CM

## 2011-11-03 DIAGNOSIS — E118 Type 2 diabetes mellitus with unspecified complications: Secondary | ICD-10-CM

## 2011-11-03 DIAGNOSIS — M549 Dorsalgia, unspecified: Secondary | ICD-10-CM

## 2011-11-03 DIAGNOSIS — F339 Major depressive disorder, recurrent, unspecified: Secondary | ICD-10-CM

## 2011-11-03 MED ORDER — DULOXETINE HCL 30 MG PO CPEP: 30.0000 mg | ORAL_CAPSULE | Freq: Every day | ORAL | Status: DC

## 2011-11-03 MED ORDER — INSULIN PEN NEEDLE 31G X 5 MM MISC: 1.0000 "pen " | Freq: Three times a day (TID) | Status: DC

## 2011-11-03 NOTE — Assessment & Plan Note (Addendum)
Did not tolerate remeron. Will try cymbalta 30mg  daily with goal of 60mg  daily. Will f/u with Dr. Charlett Blake within the month to titrate up. Patient will think about meeting with Dr. Gwenlyn Saran. Already has therapist at house 1x a week for 30 minutes.

## 2011-11-03 NOTE — Assessment & Plan Note (Signed)
Doing well after recent hospitalization. Continued to encourage marijuana cessation. Patient plans to be clean by end of year. She denies smoking today but her sweatshirt smells heavily of recent THC use.

## 2011-11-03 NOTE — Assessment & Plan Note (Signed)
Denied refill of oxycodone as has scheduled refills with Dr. Charlett Blake. Will defer referral to PCP for chiropracter. At this time will start Cymbalta with plan to titrate up to 60 mg daily. Told patient to followup with PCP within the next month.

## 2011-11-03 NOTE — Assessment & Plan Note (Signed)
Encourage cessation as likely linked to patient's cyclic vomiting. Patient states her goal is to quit by the new year. Denies use since discharge but smells heavily of marijuana.

## 2011-11-03 NOTE — Progress Notes (Signed)
  Subjective:    Patient ID: [name] [name], female    DOB: 1966/07/06, 45 y.o.   MRN: [id number]  HPI  patient is a 45 year old female with a history of cyclic vomiting and chronic marijuana use presenting for hospital followup. Patient hospitalized for 3 days. She's had some mild nausea since discharge on 10/30 but no vomiting. She has been eating pretty well for the last 3 days. She continues to not take her Remeron and she thinks this previously caused her vomiting. We have told patient to stop this at discharge. Patient claims she has not been smoking marijuana since discharge. Stress at home has been minimized since the patient's son recently moved down. Her son had recently gotten out of jail and Motrin with her. She is not currently interested in meeting with Dr. Gwenlyn Saran but will think about it.   Patient thought about starting a medication for depression at last hospitalization but wanted to defer until today. Interested at this time.  Patient more interested in talking about her chronic back pain. She states she's been taking 2-1/2 pills at the same time of oxycodone 5 mg every day. She was given a 30 day prescription for 60 pills which was supposed to last her until November 14. She is requesting more narcotics today. Patient also requests referral to a chiropractor.  Past medical history-occasional cigarette smoker  Review of Systems negative except as noted in HPI     Objective:   Physical Exam BP 168/88  Pulse 72  Temp(Src) 98.1 F (36.7 C) (Oral)  Ht 5\' 1"  (1.549 m)  Wt 177 lb (80.287 kg)  BMI 33.44 kg/m2 Vital signs reviewed General appearance - alert, well appearing, and in no distress and oriented to person, place, and time. Patient tearful throughout appointment about chronic stressors (vomiting and back pain).  Heart - normal rate, regular rhythm, normal S1, S2, no murmurs, rubs, clicks or gallops Chest - clear to auscultation, no wheezes, rales or rhonchi, symmetric air  entry, no tachypnea, retractions or cyanosis Abdomen-soft, slight diffuse tenderness, normal bowel sounds    Assessment & Plan:

## 2011-11-03 NOTE — Assessment & Plan Note (Signed)
Poorly controlled today but patient currently in pain. Will follow up when patient at decreased level of pain. Patient claims adherence to medications.

## 2011-11-03 NOTE — Patient Instructions (Signed)
It was good to see you again. I am sorry your back isn't feeling well.   For your depression and your back pain, I want you to take cymbalta 30mg  once daily for one month. I want you to see Dr. Charlett Blake within the next month. She will likely increase your dose to 60mg  daily.   I am glad you are not vomiting. I want you to continue to try to abstain from marijuana. Use the resources from [place], the [employer], museum. I believe you can complete your goal of not smoking marijuana by the new year.   I want you to continue to think about meeting with Dr. Gwenlyn Saran, our psychologist. Talk with Dr. Charlett Blake at your next appointment about this.

## 2011-11-04 ENCOUNTER — Telehealth: Payer: Self-pay | Admitting: Family Medicine

## 2011-11-04 NOTE — Telephone Encounter (Signed)
Patient seen yesterday & was supposed to get Rx for the tips for her novolog, needs this sent to [facility] asap

## 2011-11-05 NOTE — Telephone Encounter (Signed)
Spoke with Dr. Wendy Poet and he advises that patient may increase Ativan 1 mg tabs  to 2 tabs at bedtime . Since she will run short she will need to come in to see MD next week to get an updated RX and  Grief Counseling  if needed. Patient advised.

## 2011-11-05 NOTE — Telephone Encounter (Addendum)
Called pharmacy and needles are ready for pick up. Will forward message to preceptor about something for nerves. Spoke with patient . States she takes ativan every 6 hours for nausea. Has not been sleeping well for past three nights . Now with this happening she knows she will not get any rest. Was here yesterday for appointment  with Dr. Yong Channel.

## 2011-11-05 NOTE — Telephone Encounter (Signed)
Ms Kegg may take two mg of Ativan at bedtime to help with sleep for next 3 days.  She should come in early next week to discuss her grief reaction.[facility] is a resource for free os charge [place] counseling, whether or not the deceased were involved with Hospice.  Caro Brundidge D

## 2011-11-05 NOTE — Telephone Encounter (Signed)
Pt is calling again about needing tips for her needles -  She is also distressed that she has lost 2 cousins that she was close to within the last 12 hours - would like to know if she can get something for her nerves.

## 2011-11-23 ENCOUNTER — Encounter: Payer: Self-pay | Admitting: Family Medicine

## 2011-11-23 ENCOUNTER — Ambulatory Visit (INDEPENDENT_AMBULATORY_CARE_PROVIDER_SITE_OTHER): Payer: Medicare Other | Admitting: Family Medicine

## 2011-11-23 DIAGNOSIS — F1290 Cannabis use, unspecified, uncomplicated: Secondary | ICD-10-CM

## 2011-11-23 DIAGNOSIS — F1210 Cannabis abuse, uncomplicated: Secondary | ICD-10-CM

## 2011-11-23 DIAGNOSIS — M549 Dorsalgia, unspecified: Secondary | ICD-10-CM

## 2011-11-23 DIAGNOSIS — R252 Cramp and spasm: Secondary | ICD-10-CM | POA: Insufficient documentation

## 2011-11-23 DIAGNOSIS — F339 Major depressive disorder, recurrent, unspecified: Secondary | ICD-10-CM

## 2011-11-23 DIAGNOSIS — R1115 Cyclical vomiting syndrome unrelated to migraine: Secondary | ICD-10-CM

## 2011-11-23 LAB — BASIC METABOLIC PANEL
CO2: 23 mEq/L (ref 19–32)
Calcium: 9.8 mg/dL (ref 8.4–10.5)
Creat: 1.67 mg/dL — ABNORMAL HIGH (ref 0.50–1.10)
Glucose, Bld: 232 mg/dL — ABNORMAL HIGH (ref 70–99)

## 2011-11-23 MED ORDER — OXYCODONE HCL 5 MG PO TABS: 5.0000 mg | ORAL_TABLET | Freq: Four times a day (QID) | ORAL | Status: DC | PRN

## 2011-11-23 MED ORDER — OXYCODONE-ACETAMINOPHEN 5-325 MG PO TABS: 1.0000 | ORAL_TABLET | Freq: Two times a day (BID) | ORAL | Status: DC | PRN

## 2011-11-23 MED ORDER — PROCHLORPERAZINE MALEATE 10 MG PO TABS: 10.0000 mg | ORAL_TABLET | Freq: Four times a day (QID) | ORAL | Status: DC | PRN

## 2011-11-23 NOTE — Assessment & Plan Note (Signed)
Cramps in her hands and feet. Precepted with Dr. Vikki Ports. We'll check BMET today to ensure electrolytes are normal since last being that was more than 4 weeks ago. Checked radial pulses which are normal. DP pulses are weak. Will send for a ABIs with Dr. [name].

## 2011-11-23 NOTE — Assessment & Plan Note (Signed)
Patient is increasing her oxycodone dosing. I told her specifically that we would not increase her oxycodone prescription. She will have to manage her oxycodone use with 60 tablets of 5 mg per month. She may benefit from a pain clinic referral, however she'll not be accepted there until she stopped using marijuana.

## 2011-11-23 NOTE — Progress Notes (Signed)
  Subjective:    Patient ID: [name] [name], female    DOB: Mar 28, 1966, 45 y.o.   MRN: [id number]  HPI  Pain management-patient here with concerns about pain management. She is taking oxycodone for her chronic back pain. She states that she is using up to 20 mg a time when her back pain is particularly bad. This has been going on since 2002. She states that worked up it was considered neuropathy type pain. She does not have problems with her bowels or her urination. She has not had loss of sensation in her legs.  Marijuana use-patient reports that she is trying to become clean by the end of the year.  Depression-patient denies current suicidal ideation. She has a lot of stress in her life between relatives that have died send him back from prison and other stressors. She has a therapist coming to her house half an hour the per week she did not start the Cymbalta because there is a significant side effect of nausea.  Nausea vomiting-patient has not been hospitalized for one month. She is intermittently using her nausea medicines including Compazine Phenergan and Zofran.  Hand cramps-patient reports intermittent hand and foot cramps. These come on without warning usually when she is doing something like stirring a pot braiding hair. They can last up to a few hours. An Implanon over last 4-6 months. They seem to be infrequent but over the last week she's had 3 cramping episodes. When she gets these they last a few hours and Flexeril does not help.  Review of Systems    see above Objective:   Physical Exam Vital signs reviewed General appearance - alert, well appearing, and in no distress and oriented to person, place, and time Heart - normal rate, regular rhythm, normal S1, S2, no murmurs, rubs, clicks or gallops Chest - clear to auscultation, no wheezes, rales or rhonchi, symmetric air entry, no tachypnea, retractions or cyanosis Mood-patient with good eye contact, full sentences. Patient  endorses feelings of discouragement with her disease course. Hands-no nodules in the ligaments. No swelling. Radial pulses full and equal. Legs-dorsalis pedis pulses present but weak bilaterally.       Assessment & Plan:

## 2011-11-23 NOTE — Assessment & Plan Note (Signed)
Patient states that she will be cleaned by January 1. I told her that she would not feel to the pain clinic until after she stopped smoking marijuana.

## 2011-11-23 NOTE — Assessment & Plan Note (Signed)
Patient refused to try Cymbalta. She was worried about nausea. I am considering to try cyclic. I will talk to Dr. [name] about what to start. I would like to use something that'll help with chronic pain as well as depression

## 2011-11-23 NOTE — Assessment & Plan Note (Signed)
Patient has mild symptoms today but is not currently vomiting. Patient wanted a refill of her Compazine which was done today. Patient states she is having trouble with her insurance getting the Zofran.

## 2011-11-23 NOTE — Patient Instructions (Signed)
I am checking some blood work today. I would like you to make an appointment to see Dr. [name] for ABIs-blood pressures in your legs I will talk to him about what medicine to start for your depression. Please see me the week after you see Dr. [name] I refilled her oxycodone today for 2 months.

## 2011-11-29 ENCOUNTER — Encounter (HOSPITAL_COMMUNITY): Payer: Self-pay | Admitting: *Deleted

## 2011-11-29 ENCOUNTER — Emergency Department (HOSPITAL_COMMUNITY): Payer: Medicare Other

## 2011-11-29 ENCOUNTER — Emergency Department (HOSPITAL_COMMUNITY)
Admission: EM | Admit: 2011-11-29 | Discharge: 2011-11-29 | Disposition: A | Discharge status: Home / Self Care | Payer: Medicare Other | Attending: Emergency Medicine | Admitting: Emergency Medicine

## 2011-11-29 DIAGNOSIS — R000 Tachycardia, unspecified: Secondary | ICD-10-CM | POA: Insufficient documentation

## 2011-11-29 DIAGNOSIS — F411 Generalized anxiety disorder: Secondary | ICD-10-CM | POA: Insufficient documentation

## 2011-11-29 DIAGNOSIS — I10 Essential (primary) hypertension: Secondary | ICD-10-CM | POA: Insufficient documentation

## 2011-11-29 DIAGNOSIS — R42 Dizziness and giddiness: Secondary | ICD-10-CM | POA: Insufficient documentation

## 2011-11-29 DIAGNOSIS — R5381 Other malaise: Secondary | ICD-10-CM | POA: Insufficient documentation

## 2011-11-29 DIAGNOSIS — R0682 Tachypnea, not elsewhere classified: Secondary | ICD-10-CM | POA: Insufficient documentation

## 2011-11-29 DIAGNOSIS — Z9889 Other specified postprocedural states: Secondary | ICD-10-CM | POA: Insufficient documentation

## 2011-11-29 DIAGNOSIS — Z90710 Acquired absence of both cervix and uterus: Secondary | ICD-10-CM | POA: Insufficient documentation

## 2011-11-29 DIAGNOSIS — E119 Type 2 diabetes mellitus without complications: Secondary | ICD-10-CM | POA: Insufficient documentation

## 2011-11-29 DIAGNOSIS — R109 Unspecified abdominal pain: Secondary | ICD-10-CM | POA: Insufficient documentation

## 2011-11-29 DIAGNOSIS — R1115 Cyclical vomiting syndrome unrelated to migraine: Secondary | ICD-10-CM

## 2011-11-29 DIAGNOSIS — R10816 Epigastric abdominal tenderness: Secondary | ICD-10-CM | POA: Insufficient documentation

## 2011-11-29 DIAGNOSIS — Z794 Long term (current) use of insulin: Secondary | ICD-10-CM | POA: Insufficient documentation

## 2011-11-29 HISTORY — DX: Cyclical vomiting syndrome unrelated to migraine: R11.15

## 2011-11-29 LAB — COMPREHENSIVE METABOLIC PANEL
AST: 15 U/L (ref 0–37)
Albumin: 4 g/dL (ref 3.5–5.2)
BUN: 31 mg/dL — ABNORMAL HIGH (ref 6–23)
Calcium: 10 mg/dL (ref 8.4–10.5)
Creatinine, Ser: 1.73 mg/dL — ABNORMAL HIGH (ref 0.50–1.10)
GFR calc non Af Amer: 34 mL/min — ABNORMAL LOW (ref >90)

## 2011-11-29 LAB — URINALYSIS, ROUTINE W REFLEX MICROSCOPIC
Bilirubin Urine: NEGATIVE
Glucose, UA: >1000 mg/dL — AB
Ketones, ur: 15 mg/dL — AB
Leukocytes, UA: NEGATIVE
Nitrite: NEGATIVE
Protein, ur: 100 mg/dL — AB
Specific Gravity, Urine: 1.023 (ref 1.005–1.030)
Urobilinogen, UA: 0.2 mg/dL (ref 0.0–1.0)
pH: 5.5 (ref 5.0–8.0)

## 2011-11-29 LAB — DIFFERENTIAL
Basophils Absolute: 0 10*3/uL (ref 0.0–0.1)
Basophils Relative: 0 % (ref 0–1)
Eosinophils Absolute: 0 10*3/uL (ref 0.0–0.7)
Eosinophils Relative: 0 % (ref 0–5)
Lymphocytes Relative: 22 % (ref 12–46)
Lymphs Abs: 1.4 10*3/uL (ref 0.7–4.0)
Monocytes Absolute: 0.4 10*3/uL (ref 0.1–1.0)
Monocytes Relative: 6 % (ref 3–12)
Neutro Abs: 4.6 10*3/uL (ref 1.7–7.7)
Neutrophils Relative %: 72 % (ref 43–77)

## 2011-11-29 LAB — CBC
HCT: 35.5 % — ABNORMAL LOW (ref 36.0–46.0)
Hemoglobin: 12.4 g/dL (ref 12.0–15.0)
MCH: 31.3 pg (ref 26.0–34.0)
MCHC: 34.9 g/dL (ref 30.0–36.0)
MCV: 89.6 fL (ref 78.0–100.0)
Platelets: 201 10*3/uL (ref 150–400)
RBC: 3.96 MIL/uL (ref 3.87–5.11)
RDW: 12.7 % (ref 11.5–15.5)
WBC: 6.4 10*3/uL (ref 4.0–10.5)

## 2011-11-29 LAB — URINE MICROSCOPIC-ADD ON

## 2011-11-29 LAB — GLUCOSE, CAPILLARY: Glucose-Capillary: 296 mg/dL — ABNORMAL HIGH (ref 70–99)

## 2011-11-29 LAB — LACTIC ACID, PLASMA: Lactic Acid, Venous: 1.2 mmol/L (ref 0.5–2.2)

## 2011-11-29 LAB — LIPASE, BLOOD: Lipase: 35 U/L (ref 11–59)

## 2011-11-29 MED ORDER — SODIUM CHLORIDE 0.9 % IV BOLUS (SEPSIS)
1000.0000 mL | Freq: Once | INTRAVENOUS | Status: AC
  Administered 2011-11-29: 1000 mL via INTRAVENOUS

## 2011-11-29 MED ORDER — SODIUM CHLORIDE 0.9 % IV SOLN
999.0000 mL | Freq: Once | INTRAVENOUS | Status: AC
  Administered 2011-11-29: 09:00:00 via INTRAVENOUS

## 2011-11-29 MED ORDER — ONDANSETRON HCL 4 MG/2ML IJ SOLN
INTRAMUSCULAR | Status: AC
  Administered 2011-11-29: 08:00:00
  Filled 2011-11-29: qty 2

## 2011-11-29 MED ORDER — LORAZEPAM 2 MG/ML IJ SOLN
INTRAMUSCULAR | Status: AC
  Administered 2011-11-29: 1 mg via INTRAVENOUS
  Filled 2011-11-29: qty 1

## 2011-11-29 MED ORDER — INSULIN ASPART 100 UNIT/ML ~~LOC~~ SOLN
8.0000 [IU] | Freq: Once | SUBCUTANEOUS | Status: AC
  Administered 2011-11-29: 8 [IU] via SUBCUTANEOUS
  Filled 2011-11-29: qty 1

## 2011-11-29 MED ORDER — ONDANSETRON 8 MG PO TBDP: 8.0000 mg | ORAL_TABLET | Freq: Three times a day (TID) | ORAL | Status: AC | PRN

## 2011-11-29 MED ORDER — PROMETHAZINE HCL 25 MG/ML IJ SOLN
25.0000 mg | INTRAMUSCULAR | Status: DC | PRN
  Administered 2011-11-29: 25 mg via INTRAVENOUS
  Filled 2011-11-29 (×2): qty 1

## 2011-11-29 MED ORDER — ONDANSETRON 8 MG/NS 50 ML IVPB
8.0000 mg | Freq: Once | INTRAVENOUS | Status: AC
  Administered 2011-11-29: 8 mg via INTRAVENOUS
  Filled 2011-11-29 (×2): qty 8

## 2011-11-29 MED ORDER — LORAZEPAM 2 MG/ML IJ SOLN: 0.5000 mg | Freq: Once | INTRAMUSCULAR | Status: DC

## 2011-11-29 MED ORDER — FAMOTIDINE IN NACL 20-0.9 MG/50ML-% IV SOLN
20.0000 mg | Freq: Two times a day (BID) | INTRAVENOUS | Status: DC
  Administered 2011-11-29: 20 mg via INTRAVENOUS
  Filled 2011-11-29: qty 50

## 2011-11-29 MED ORDER — LORAZEPAM 2 MG/ML IJ SOLN
0.5000 mg | Freq: Once | INTRAMUSCULAR | Status: AC
  Administered 2011-11-29: 0.5 mg via INTRAVENOUS
  Filled 2011-11-29: qty 1

## 2011-11-29 MED ORDER — BISACODYL 10 MG RE SUPP
10.0000 mg | Freq: Once | RECTAL | Status: AC
  Administered 2011-11-29: 10 mg via RECTAL
  Filled 2011-11-29: qty 1

## 2011-11-29 MED ORDER — INSULIN REGULAR HUMAN 100 UNIT/ML IJ SOLN
8.0000 [IU] | Freq: Once | INTRAMUSCULAR | Status: DC
  Filled 2011-11-29: qty 0.08

## 2011-11-29 MED ORDER — HYDROMORPHONE HCL PF 1 MG/ML IJ SOLN: 1.0000 mg | Freq: Once | INTRAMUSCULAR | Status: DC

## 2011-11-29 MED ORDER — HYDROMORPHONE HCL PF 2 MG/ML IJ SOLN
INTRAMUSCULAR | Status: AC
  Administered 2011-11-29: 2 mg via INTRAVENOUS
  Filled 2011-11-29: qty 1

## 2011-11-29 MED ORDER — SODIUM CHLORIDE 0.9 % IV SOLN: 999.0000 mL | INTRAVENOUS | Status: DC

## 2011-11-29 MED ORDER — LORAZEPAM 2 MG/ML IJ SOLN
1.0000 mg | Freq: Once | INTRAMUSCULAR | Status: AC
  Administered 2011-11-29: 1 mg via INTRAVENOUS

## 2011-11-29 MED ORDER — HYDROMORPHONE HCL PF 2 MG/ML IJ SOLN
1.0000 mg | INTRAMUSCULAR | Status: DC | PRN
  Administered 2011-11-29 (×2): 2 mg via INTRAVENOUS
  Administered 2011-11-29: 1 mg via INTRAVENOUS
  Filled 2011-11-29 (×2): qty 1

## 2011-11-29 MED ORDER — PANTOPRAZOLE SODIUM 40 MG IV SOLR
40.0000 mg | Freq: Two times a day (BID) | INTRAVENOUS | Status: DC
  Administered 2011-11-29: 40 mg via INTRAVENOUS
  Filled 2011-11-29: qty 40

## 2011-11-29 MED ORDER — SODIUM CHLORIDE 0.9 % IV SOLN: INTRAVENOUS | Status: DC

## 2011-11-29 NOTE — ED Provider Notes (Signed)
Evaluation and management procedures were performed by the Resident Physician under my supervision/collaboration.  I evaluated this patient face-to-face at the time of encounter.  Please see my note dated at that time.   Charlena Cross, MD 11/29/11 [phone]

## 2011-11-29 NOTE — ED Provider Notes (Signed)
History     CSN: [id number] Arrival date & time: 11/29/2011  7:49 AM   First MD Initiated Contact with Patient 11/29/11 0750      Chief Complaint  Patient presents with  . Nausea  . Emesis  . Abdominal Pain    (Consider location/radiation/quality/duration/timing/severity/associated sxs/prior treatment) HPI  CC: vomiting and pain  Ms. Langton is a 45 year old woman with PMH cyclic vomiting, DM, HTN who presents with 36 hours of nausea and abdominal pain.  She states that she was recently changed from [facility] to roxicet 5-325 and states that she began to feel nauseated after the first dose on Saturday. She states that she then started Saturday at 1 AM.  She describes the vomiting as continuous, first composed of food contents, but has progressed to bilious.  She denies blood in the vomit.  She tried a dose of oral phenergan, but did not find relief. She states her pain awoke her from sleeping on Saturday night.  She states it is a constant, burning, epigastric pain that has no radiation.  She states it is improved with pain medicine, but is not made worse with change in position. She can lie flat in the bed.  She describes the pain as very severe.  She has a number of admissions for her cyclic vomiting and states that this episode feels just like her prior episodes. She has had a hysterectomy and subsequent SBO with lysis of adhesions. She states that this did not make the cycles of vomiting go away.     She denies diarrhea, melena or BRBPR.  She endorses some chills. Denies pain elsewhere.      Past Medical History  Diagnosis Date  . PANCREATITIS 05/09/2008  . NEUROPATHY, DIABETIC 02/23/2007  . Cyclic vomiting syndrome   Diabetes   Past Surgical History  Procedure Date  . Port-a-cath placement   . Cholecystectomy   . Abdominal hysterectomy   . Laparotomy and lysis of adhesions     Family History  Problem Relation Age of Onset  . Heart attack Mother 48    death     History  Substance Use Topics  . Smoking status: Current Some Day Smoker  . Smokeless tobacco: Never Used   Comment: 1 cig this week  . Alcohol Use: Not on file  Marijuana use on thanksgiving   OB History    Grav Para Term Preterm Abortions TAB SAB Ect Mult Living                  Review of Systems  Constitutional: Positive for chills and diaphoresis. Negative for fever, appetite change and fatigue.  HENT: Negative for congestion and rhinorrhea.   Eyes: Positive for photophobia.  Respiratory: Negative for cough, chest tightness and shortness of breath.   Cardiovascular: Negative for chest pain, palpitations and leg swelling.  Gastrointestinal: Positive for nausea, vomiting, abdominal pain and abdominal distention. Negative for diarrhea, constipation, blood in stool and anal bleeding.  Genitourinary: Negative for dysuria, hematuria, flank pain, enuresis and difficulty urinating.  Musculoskeletal: Negative for arthralgias.  Skin: Negative for pallor, rash and wound.  Neurological: Positive for dizziness. Negative for light-headedness and headaches.  Psychiatric/Behavioral: Positive for agitation. Negative for behavioral problems, confusion and decreased concentration. The patient is nervous/anxious and is hyperactive.     Allergies  Erythromycin and Oxycodone-acetaminophen  Home Medications   Current Outpatient Rx  Name Route Sig Dispense Refill  . ASPIRIN 81 MG PO TABS Oral Take 81 mg by  mouth daily.      Marland Kitchen CARVEDILOL 12.5 MG PO TABS Oral Take 1 tablet (12.5 mg total) by mouth 2 (two) times daily with a meal. 60 tablet 11  . CYCLOBENZAPRINE HCL 5 MG PO TABS Oral Take 2 tablets (10 mg total) by mouth 3 (three) times daily as needed. 21 tablet 4  . DOCUSATE SODIUM 100 MG PO CAPS Oral Take 1 capsule (100 mg total) by mouth 2 (two) times daily as needed. 30 capsule 11  . DULOXETINE HCL 30 MG PO CPEP Oral Take 1 capsule (30 mg total) by mouth daily. 30 capsule 2  .  EPINEPHRINE 0.3 MG/0.3ML IJ DEVI  One dose Im as needed for severe anaphylaxis.     . INSULIN ASPART 100 UNIT/ML [place] SOLN Subcutaneous Inject 6 Units into the skin 3 (three) times daily before meals. Dispense as Pen 3 mL 11  . INSULIN GLARGINE 100 UNIT/ML [place] SOLN Subcutaneous Inject 22 Units into the skin every morning. . 10 mL 11  . INSULIN PEN NEEDLE 31G X 5 MM MISC Does not apply 1 pen by Does not apply route 4 (four) times daily -  before meals and at bedtime. 90 each 2    May substitute as needed to fit paient's pens  . INSULIN SYRINGE-NEEDLE U-100 29G X 1/2" 0.3 ML MISC  To be used for prn IM phenergan injections 100 each 11  . LIDOCAINE 5 % EX PTCH Transdermal Place 1 patch onto the skin every 12 (twelve) hours. Remove & Discard patch within 12 hours or as directed by MD 30 patch 11  . LISINOPRIL 2.5 MG PO TABS Oral Take 1 tablet (2.5 mg total) by mouth daily. 30 tablet 11  . LORAZEPAM 1 MG PO TABS Oral Take 1 tablet (1 mg total) by mouth every 6 (six) hours as needed. For nausea. 30 tablet 3  . METFORMIN HCL 1000 MG PO TABS      . NICOTINE 7 MG/24HR TD PT24  APPLY 1 PATCH DAILY AS NEEDED *REMOVE OLD PATCH* 30 patch 2    RX SIG: APPLY 1 PATCH DAILY AS NEEDED *REMOVE OLD  ...  . OMEPRAZOLE 20 MG PO CPDR Oral Take 1 capsule (20 mg total) by mouth daily. 30 capsule 11  . OXYCODONE HCL 5 MG PO TABS Oral Take 1 tablet (5 mg total) by mouth every 6 (six) hours as needed for pain. Fill 30 days from date on rx 60 tablet 0  . OXYCODONE HCL 5 MG PO TABS Oral Take 1 tablet (5 mg total) by mouth every 6 (six) hours as needed for pain. Fill 60 days from date on rx 60 tablet 0  . OXYCODONE-ACETAMINOPHEN 5-325 MG PO TABS Oral Take 1 tablet by mouth 2 (two) times daily as needed for pain. 60 tablet 0  . POLYETHYLENE GLYCOL 3350 PO POWD Oral Take 17 g by mouth daily. Mix packet with water once daily as needed for constipation. 255 g 11  . PROCHLORPERAZINE MALEATE 10 MG PO TABS Oral Take 1 tablet (10 mg total)  by mouth every 6 (six) hours as needed. 30 tablet 2  . PROMETHAZINE HCL 25 MG RE SUPP Rectal Place 1 suppository (25 mg total) rectally every 6 (six) hours as needed. 12 each 11  . PROMETHAZINE HCL 25 MG PO TABS Oral Take 1 tablet (25 mg total) by mouth every 4 (four) hours. At first sign of nausea.  120 tablet 11  . PROMETHAZINE HCL 25 MG/ML IJ SOLN Intramuscular  Inject 1 mL (25 mg total) into the muscle every 6 (six) hours as needed. 10 mL 11  . PROMETHAZINE HCL IM  Inject 25 mg (77mL) IM every 6 hours as needed.     . SUCRALFATE 1 G PO TABS Oral Take 1 tablet (1 g total) by mouth 4 (four) times daily -  with meals and at bedtime. 120 tablet 11    BP 205/123  Pulse 107  SpO2 98%  Physical Exam  Constitutional: She appears well-developed and well-nourished. She appears listless. She appears distressed.  HENT:  Head: Normocephalic.  Nose: Nose normal.  Mouth/Throat: Oropharynx is clear and moist.  Eyes: Conjunctivae and EOM are normal. Pupils are equal, round, and reactive to light. No scleral icterus.  Cardiovascular: Regular rhythm.  Tachycardia present.  Exam reveals no gallop and no friction rub.   No murmur heard. Pulmonary/Chest: Breath sounds normal. No accessory muscle usage. Tachypnea noted. No respiratory distress.  Abdominal: Normal appearance. She exhibits shifting dullness. She exhibits no distension, no fluid wave and no ascites. Bowel sounds are decreased. There is tenderness in the epigastric area. There is no rigidity, no rebound, no guarding, no CVA tenderness, no tenderness at McBurney's point and negative Murphy's sign.         Midline suprapubic scar  Neurological: She appears listless. She is not disoriented. No cranial nerve deficit or sensory deficit.  Skin: Skin is warm. She is diaphoretic. No pallor.    ED Course  Procedures (including critical care time)   Labs Reviewed  CBC  DIFFERENTIAL  COMPREHENSIVE METABOLIC PANEL  LIPASE, BLOOD  URINALYSIS,  ROUTINE W REFLEX MICROSCOPIC  OCCULT BLOOD GASTRIC / DUODENUM  POCT CBG MONITORING   No results found.   No diagnosis found.    MDM  Woman with DM and cyclic vomiting, comes in with abdominal pain and vomiting.  DDx includes cyclic vomiting, pancreatitis, SBO, gastroparesis, UTI or DKA with gastroparesis.  Will treat pain and nausea with phenergan, dilaudid, pantoprazole and famotidine. KUB showed stool in the colon. Gave bisacodyl 10 mg PR for to see if this improves her N/V. Bicarb was 18 and AG of 15. Will check lactate  14:20 PM - pts pain was decreases substantially to 5/10 and she passed fluid challenge without difficulty. She felt like she wanted to go home and not be admitted. Lactate pending.  If lactate negative, will discharge. If positive will order abdominal CT.   14:54 - lactate negative but still having continued nausea.  zofran and ativan given.   15:28 - pt states she will be ready for d/c to home after getting above zofran and ativan.  D/c ordered pending above medication administration.       Augustin Coupe, MD 11/29/11 [phone]

## 2011-11-29 NOTE — ED Notes (Signed)
IV team returned page and will be down shortly to access port. RN aware

## 2011-11-29 NOTE — ED Notes (Signed)
PO fluid trial initiated, pt is tolerating it well so far

## 2011-11-29 NOTE — ED Notes (Signed)
IV team Gerri RN was at bedside to access [name] [name] and draw blood.

## 2011-11-29 NOTE — ED Notes (Signed)
[id number] Expected date:11/29/11<BR> Expected time: 7:37 AM<BR> Means of arrival:Ambulance<BR> Comments:<BR>

## 2011-11-29 NOTE — ED Notes (Signed)
Pt reports n/v, abd pain x3 days. Hx of Cyclic vomiting syndrome.

## 2011-11-29 NOTE — ED Notes (Signed)
Attempted to get labs x2 but unable. Pt has port-a-cath and has requested that it be accessed now. RN aware and IV team paged.

## 2011-11-29 NOTE — ED Provider Notes (Signed)
9:04 AM  I performed a history and physical examination of [name] [name] and discussed her management with Dr. Rosine Door.  I agree with the history, physical, assessment, and plan of care, with the following exceptions: None  The patient presents with nausea, vomiting, and epigastric abdominal pain that's been going on for 2 days. She has a history of cyclic vomiting syndrome as well and is pancreatitis. She has had no blood in her emesis. Last bowel movement was yesterday. On examination, the patient is diaphoretic and hypertensive and in apparent discomfort. Her lungs are clear to auscultation and heart rate and rhythm is tachycardic but regular. Her abdomen is tender to palpation at the epigastrium, but with no rigidity, rebound, or guarding, and with hypoactive bowel sounds.  I was present for the following procedures: None Time Spent in Critical Care of the patient: None Time spent in discussions with the patient and family: 5 minutes  Gaddiel Cullens D    Charlena Cross, MD 11/29/11 [phone]

## 2011-12-05 ENCOUNTER — Encounter (HOSPITAL_COMMUNITY): Payer: Self-pay | Admitting: Emergency Medicine

## 2011-12-05 ENCOUNTER — Emergency Department (HOSPITAL_COMMUNITY): Payer: Medicare Other

## 2011-12-05 ENCOUNTER — Inpatient Hospital Stay (HOSPITAL_COMMUNITY)
Admission: EM | Admit: 2011-12-05 | Discharge: 2011-12-09 | DRG: 392 | Disposition: A | Discharge status: Home / Self Care | Payer: Medicare Other | Attending: Family Medicine | Admitting: Family Medicine

## 2011-12-05 DIAGNOSIS — G43909 Migraine, unspecified, not intractable, without status migrainosus: Secondary | ICD-10-CM | POA: Diagnosis present

## 2011-12-05 DIAGNOSIS — G8929 Other chronic pain: Secondary | ICD-10-CM | POA: Diagnosis present

## 2011-12-05 DIAGNOSIS — M549 Dorsalgia, unspecified: Secondary | ICD-10-CM | POA: Diagnosis present

## 2011-12-05 DIAGNOSIS — R1110 Vomiting, unspecified: Secondary | ICD-10-CM

## 2011-12-05 DIAGNOSIS — F17200 Nicotine dependence, unspecified, uncomplicated: Secondary | ICD-10-CM | POA: Diagnosis present

## 2011-12-05 DIAGNOSIS — N183 Chronic kidney disease, stage 3 unspecified: Secondary | ICD-10-CM | POA: Diagnosis present

## 2011-12-05 DIAGNOSIS — F329 Major depressive disorder, single episode, unspecified: Secondary | ICD-10-CM | POA: Diagnosis present

## 2011-12-05 DIAGNOSIS — Z7982 Long term (current) use of aspirin: Secondary | ICD-10-CM

## 2011-12-05 DIAGNOSIS — D649 Anemia, unspecified: Secondary | ICD-10-CM | POA: Diagnosis present

## 2011-12-05 DIAGNOSIS — D72829 Elevated white blood cell count, unspecified: Secondary | ICD-10-CM | POA: Diagnosis present

## 2011-12-05 DIAGNOSIS — I129 Hypertensive chronic kidney disease with stage 1 through stage 4 chronic kidney disease, or unspecified chronic kidney disease: Secondary | ICD-10-CM | POA: Diagnosis present

## 2011-12-05 DIAGNOSIS — F411 Generalized anxiety disorder: Secondary | ICD-10-CM | POA: Diagnosis present

## 2011-12-05 DIAGNOSIS — R000 Tachycardia, unspecified: Secondary | ICD-10-CM | POA: Diagnosis present

## 2011-12-05 DIAGNOSIS — E875 Hyperkalemia: Secondary | ICD-10-CM | POA: Diagnosis not present

## 2011-12-05 DIAGNOSIS — K920 Hematemesis: Secondary | ICD-10-CM | POA: Diagnosis present

## 2011-12-05 DIAGNOSIS — R197 Diarrhea, unspecified: Secondary | ICD-10-CM | POA: Diagnosis present

## 2011-12-05 DIAGNOSIS — R739 Hyperglycemia, unspecified: Secondary | ICD-10-CM

## 2011-12-05 DIAGNOSIS — F339 Major depressive disorder, recurrent, unspecified: Secondary | ICD-10-CM

## 2011-12-05 DIAGNOSIS — E860 Dehydration: Secondary | ICD-10-CM | POA: Diagnosis present

## 2011-12-05 DIAGNOSIS — K219 Gastro-esophageal reflux disease without esophagitis: Principal | ICD-10-CM | POA: Diagnosis present

## 2011-12-05 DIAGNOSIS — Z794 Long term (current) use of insulin: Secondary | ICD-10-CM

## 2011-12-05 DIAGNOSIS — R1115 Cyclical vomiting syndrome unrelated to migraine: Secondary | ICD-10-CM | POA: Diagnosis present

## 2011-12-05 DIAGNOSIS — Z79899 Other long term (current) drug therapy: Secondary | ICD-10-CM

## 2011-12-05 DIAGNOSIS — R109 Unspecified abdominal pain: Secondary | ICD-10-CM | POA: Diagnosis present

## 2011-12-05 DIAGNOSIS — N289 Disorder of kidney and ureter, unspecified: Secondary | ICD-10-CM | POA: Diagnosis present

## 2011-12-05 DIAGNOSIS — IMO0001 Reserved for inherently not codable concepts without codable children: Secondary | ICD-10-CM | POA: Diagnosis present

## 2011-12-05 HISTORY — DX: Essential (primary) hypertension: I10

## 2011-12-05 LAB — COMPREHENSIVE METABOLIC PANEL
AST: 14 U/L (ref 0–37)
Albumin: 4.3 g/dL (ref 3.5–5.2)
Alkaline Phosphatase: 105 U/L (ref 39–117)
BUN: 29 mg/dL — ABNORMAL HIGH (ref 6–23)
CO2: 23 mEq/L (ref 19–32)
Chloride: 97 mEq/L (ref 96–112)
Creatinine, Ser: 1.73 mg/dL — ABNORMAL HIGH (ref 0.50–1.10)
GFR calc non Af Amer: 34 mL/min — ABNORMAL LOW (ref >90)
Potassium: 3.4 mEq/L — ABNORMAL LOW (ref 3.5–5.1)
Total Bilirubin: 0.4 mg/dL (ref 0.3–1.2)

## 2011-12-05 LAB — URINALYSIS, ROUTINE W REFLEX MICROSCOPIC
Glucose, UA: >1000 mg/dL — AB
Ketones, ur: 15 mg/dL — AB
Protein, ur: 100 mg/dL — AB
Urobilinogen, UA: 0.2 mg/dL (ref 0.0–1.0)

## 2011-12-05 LAB — GLUCOSE, CAPILLARY
Glucose-Capillary: 470 mg/dL — ABNORMAL HIGH (ref 70–99)
Glucose-Capillary: 408 mg/dL — ABNORMAL HIGH (ref 70–99)
Glucose-Capillary: 342 mg/dL — ABNORMAL HIGH (ref 70–99)

## 2011-12-05 LAB — DIFFERENTIAL
Lymphocytes Relative: 10 % — ABNORMAL LOW (ref 12–46)
Monocytes Absolute: 1 10*3/uL (ref 0.1–1.0)
Monocytes Relative: 7 % (ref 3–12)
Neutro Abs: 11.2 10*3/uL — ABNORMAL HIGH (ref 1.7–7.7)
Neutrophils Relative %: 83 % — ABNORMAL HIGH (ref 43–77)

## 2011-12-05 LAB — CBC
HCT: 33.8 % — ABNORMAL LOW (ref 36.0–46.0)
Hemoglobin: 11.8 g/dL — ABNORMAL LOW (ref 12.0–15.0)
RBC: 3.87 MIL/uL (ref 3.87–5.11)
WBC: 13.6 10*3/uL — ABNORMAL HIGH (ref 4.0–10.5)

## 2011-12-05 LAB — URINE MICROSCOPIC-ADD ON

## 2011-12-05 MED ORDER — PROMETHAZINE HCL 25 MG/ML IJ SOLN
25.0000 mg | Freq: Once | INTRAMUSCULAR | Status: AC
  Administered 2011-12-05: 25 mg via INTRAVENOUS
  Filled 2011-12-05: qty 1

## 2011-12-05 MED ORDER — HYDROMORPHONE HCL PF 1 MG/ML IJ SOLN
1.0000 mg | Freq: Once | INTRAMUSCULAR | Status: AC
  Administered 2011-12-05: 1 mg via INTRAVENOUS
  Filled 2011-12-05: qty 1

## 2011-12-05 MED ORDER — ONDANSETRON HCL 4 MG/2ML IJ SOLN
4.0000 mg | Freq: Once | INTRAMUSCULAR | Status: AC
  Administered 2011-12-05: 4 mg via INTRAVENOUS
  Filled 2011-12-05: qty 2

## 2011-12-05 MED ORDER — SODIUM CHLORIDE 0.9 % IV SOLN
INTRAVENOUS | Status: DC
  Administered 2011-12-05: 4.1 [IU]/h via INTRAVENOUS
  Filled 2011-12-05: qty 1

## 2011-12-05 MED ORDER — SODIUM CHLORIDE 0.9 % IV BOLUS (SEPSIS)
1000.0000 mL | Freq: Once | INTRAVENOUS | Status: AC
  Administered 2011-12-05: 1000 mL via INTRAVENOUS

## 2011-12-05 MED ORDER — PANTOPRAZOLE SODIUM 40 MG IV SOLR
40.0000 mg | Freq: Once | INTRAVENOUS | Status: AC
  Administered 2011-12-05: 40 mg via INTRAVENOUS
  Filled 2011-12-05: qty 40

## 2011-12-05 NOTE — ED Notes (Signed)
[id number] Expected date:12/05/11<BR> Expected time: 3:12 PM<BR> Means of arrival:Ambulance<BR> Comments:<BR> M49 - 52yoF Fall hip fx?

## 2011-12-05 NOTE — ED Notes (Signed)
Medicated for vomiting and pain 9/10 since CT

## 2011-12-05 NOTE — ED Notes (Signed)
Patient transported to X-ray 

## 2011-12-05 NOTE — ED Notes (Signed)
Pt actively vomiting.

## 2011-12-05 NOTE — ED Notes (Signed)
Asked to chart by prior Rn that patient had large, loose, foul smelling stool.

## 2011-12-05 NOTE — ED Notes (Signed)
No small bowel obstruction per CT.

## 2011-12-05 NOTE — ED Provider Notes (Addendum)
History     CSN: [id number] Arrival date & time: 12/05/2011  3:34 PM   First MD Initiated Contact with Patient 12/05/11 1602      Chief Complaint  Patient presents with  . Diarrhea    (Consider location/radiation/quality/duration/timing/severity/associated sxs/prior treatment) Patient is a 45 y.o. female presenting with diarrhea. The history is provided by the patient and medical records. The history is limited by the condition of the patient.  Diarrhea The primary symptoms include abdominal pain, nausea, vomiting and diarrhea. Primary symptoms do not include fever or rash.  The illness does not include chills.   the patient is a 16, old female, with a history of insulin-dependent diabetes mellitus, and prior small bowel obstruction, who presents to the emergency department complaining of abdominal pain with nausea, vomiting, and diarrhea since today.  She has not had fevers, chills, cough, or shortness of breath.  She denies recent use of antibiotics.  She states that her daughter.  Recently has had a viral infection.  Past Medical History  Diagnosis Date  . PANCREATITIS 05/09/2008  . NEUROPATHY, DIABETIC 02/23/2007  . Cyclic vomiting syndrome   . Hypertension   . Diabetes mellitus     Past Surgical History  Procedure Date  . Port-a-cath placement   . Cholecystectomy   . Abdominal hysterectomy   . Laparotomy and lysis of adhesions     Family History  Problem Relation Age of Onset  . Heart attack Mother 58    death    History  Substance Use Topics  . Smoking status: Current Some Day Smoker  . Smokeless tobacco: Never Used   Comment: 1 cig this week  . Alcohol Use: No    OB History    Grav Para Term Preterm Abortions TAB SAB Ect Mult Living                  Review of Systems  Unable to perform ROS Constitutional: Negative for fever and chills.  Respiratory: Negative for cough and shortness of breath.   Cardiovascular: Negative for chest pain.    Gastrointestinal: Positive for nausea, vomiting, abdominal pain and diarrhea.  Skin: Negative for rash.       Diaphoresis  Neurological: Negative for headaches.  Psychiatric/Behavioral: Negative for confusion.    Allergies  Erythromycin and Oxycodone-acetaminophen  Home Medications   Current Outpatient Rx  Name Route Sig Dispense Refill  . ASPIRIN 81 MG PO TABS Oral Take 81 mg by mouth daily.      Marland Kitchen CARVEDILOL 12.5 MG PO TABS Oral Take 1 tablet (12.5 mg total) by mouth 2 (two) times daily with a meal. 60 tablet 11  . CYCLOBENZAPRINE HCL 5 MG PO TABS Oral Take 2 tablets (10 mg total) by mouth 3 (three) times daily as needed. 21 tablet 4  . DOCUSATE SODIUM 100 MG PO CAPS Oral Take 1 capsule (100 mg total) by mouth 2 (two) times daily as needed. 30 capsule 11  . DULOXETINE HCL 30 MG PO CPEP Oral Take 1 capsule (30 mg total) by mouth daily. 30 capsule 2  . EPINEPHRINE 0.3 MG/0.3ML IJ DEVI  One dose Im as needed for severe anaphylaxis.     . INSULIN ASPART 100 UNIT/ML [place] SOLN Subcutaneous Inject 6 Units into the skin 3 (three) times daily before meals. Dispense as Pen 3 mL 11  . INSULIN GLARGINE 100 UNIT/ML [place] SOLN Subcutaneous Inject 22 Units into the skin every morning. . 10 mL 11  . INSULIN PEN NEEDLE 31G  X 5 MM MISC Does not apply 1 pen by Does not apply route 4 (four) times daily -  before meals and at bedtime. 90 each 2    May substitute as needed to fit paient's pens  . INSULIN SYRINGE-NEEDLE U-100 29G X 1/2" 0.3 ML MISC  To be used for prn IM phenergan injections 100 each 11  . LIDOCAINE 5 % EX PTCH Transdermal Place 1 patch onto the skin every 12 (twelve) hours. Remove & Discard patch within 12 hours or as directed by MD 30 patch 11  . LISINOPRIL 2.5 MG PO TABS Oral Take 1 tablet (2.5 mg total) by mouth daily. 30 tablet 11  . LORAZEPAM 1 MG PO TABS Oral Take 1 tablet (1 mg total) by mouth every 6 (six) hours as needed. For nausea. 30 tablet 3  . NICOTINE 7 MG/24HR TD PT24  APPLY 1  PATCH DAILY AS NEEDED *REMOVE OLD PATCH* 30 patch 2    RX SIG: APPLY 1 PATCH DAILY AS NEEDED *REMOVE OLD  ...  . OMEPRAZOLE 20 MG PO CPDR Oral Take 1 capsule (20 mg total) by mouth daily. 30 capsule 11  . ONDANSETRON 8 MG PO TBDP Oral Take 1 tablet (8 mg total) by mouth every 8 (eight) hours as needed for nausea. 20 tablet 0  . OXYCODONE HCL 5 MG PO TABS Oral Take 5 mg by mouth every 4 (four) hours as needed. For pain.     Marland Kitchen POLYETHYLENE GLYCOL 3350 PO POWD Oral Take 17 g by mouth daily. Mix packet with water once daily as needed for constipation. 255 g 11  . PROMETHAZINE HCL 25 MG PO TABS Oral Take 1 tablet (25 mg total) by mouth every 4 (four) hours. At first sign of nausea.  120 tablet 11  . PROMETHAZINE HCL 25 MG/ML IJ SOLN Intramuscular Inject 1 mL (25 mg total) into the muscle every 6 (six) hours as needed. 10 mL 11  . SUCRALFATE 1 G PO TABS Oral Take 1 tablet (1 g total) by mouth 4 (four) times daily -  with meals and at bedtime. 120 tablet 11    BP 191/79  Pulse 109  Temp(Src) 98.5 F (36.9 C) (Oral)  Resp 21  SpO2 100%  Physical Exam  Vitals reviewed. Constitutional: She is oriented to person, place, and time. She appears well-developed and well-nourished. She appears distressed.       Tearful  HENT:  Head: Normocephalic and atraumatic.  Eyes: Pupils are equal, round, and reactive to light.  Neck: Normal range of motion.  Cardiovascular: Regular rhythm and normal heart sounds.   No murmur heard.      Tachycardia  Pulmonary/Chest: Effort normal and breath sounds normal. No respiratory distress. She has no wheezes. She has no rales.  Abdominal: Soft. She exhibits no distension and no mass. There is tenderness. There is guarding. There is no rebound.       Diffuse tenderness, with decreased bowel sounds  Musculoskeletal: Normal range of motion. She exhibits no edema and no tenderness.  Neurological: She is alert and oriented to person, place, and time. No cranial nerve  deficit.  Skin: Skin is warm. No rash noted. No erythema.       Diaphoretic  Psychiatric: She has a normal mood and affect. Her behavior is normal.    ED Course  Procedures (including critical care time) 45 year old female, with insulin-dependent diabetes, and history of small bowel obstruction, presents with acute abdominal pain, with nausea, vomiting, diaphoresis, and  diarrhea.  She is tachycardic, with significant abdominal pain, and moderate tenderness.  We'll perform an x-ray, to look for small bowel obstruction since he had a history the past, as well as performing laboratory testing, and administering IV fluids, and analgesics for her symptoms   Labs Reviewed  CBC  DIFFERENTIAL  COMPREHENSIVE METABOLIC PANEL  URINALYSIS, ROUTINE W REFLEX MICROSCOPIC   No results found.   Results for orders placed during the hospital encounter of 12/05/11  CBC      Component Value Range   WBC 13.6 (*) 4.0 - 10.5 (K/uL)   RBC 3.87  3.87 - 5.11 (MIL/uL)   Hemoglobin 11.8 (*) 12.0 - 15.0 (g/dL)   HCT 33.8 (*) 36.0 - 46.0 (%)   MCV 87.3  78.0 - 100.0 (fL)   MCH 30.5  26.0 - 34.0 (pg)   MCHC 34.9  30.0 - 36.0 (g/dL)   RDW 12.7  11.5 - 15.5 (%)   Platelets 263  150 - 400 (K/uL)  DIFFERENTIAL      Component Value Range   Neutrophils Relative 83 (*) 43 - 77 (%)   Neutro Abs 11.2 (*) 1.7 - 7.7 (K/uL)   Lymphocytes Relative 10 (*) 12 - 46 (%)   Lymphs Abs 1.3  0.7 - 4.0 (K/uL)   Monocytes Relative 7  3 - 12 (%)   Monocytes Absolute 1.0  0.1 - 1.0 (K/uL)   Eosinophils Relative 0  0 - 5 (%)   Eosinophils Absolute 0.0  0.0 - 0.7 (K/uL)   Basophils Relative 0  0 - 1 (%)   Basophils Absolute 0.0  0.0 - 0.1 (K/uL)  COMPREHENSIVE METABOLIC PANEL      Component Value Range   Sodium 138  135 - 145 (mEq/L)   Potassium 3.4 (*) 3.5 - 5.1 (mEq/L)   Chloride 97  96 - 112 (mEq/L)   CO2 23  19 - 32 (mEq/L)   Glucose, Bld 481 (*) 70 - 99 (mg/dL)   BUN 29 (*) 6 - 23 (mg/dL)   Creatinine, Ser 1.73 (*)  0.50 - 1.10 (mg/dL)   Calcium 10.5  8.4 - 10.5 (mg/dL)   Total Protein 8.2  6.0 - 8.3 (g/dL)   Albumin 4.3  3.5 - 5.2 (g/dL)   AST 14  0 - 37 (U/L)   ALT 10  0 - 35 (U/L)   Alkaline Phosphatase 105  39 - 117 (U/L)   Total Bilirubin 0.4  0.3 - 1.2 (mg/dL)   GFR calc non Af Amer 34 (*) >90 (mL/min)   GFR calc Af Amer 40 (*) >90 (mL/min)  URINALYSIS, ROUTINE W REFLEX MICROSCOPIC      Component Value Range   Color, Urine YELLOW  YELLOW    APPearance CLEAR  CLEAR    Specific Gravity, Urine 1.023  1.005 - 1.030    pH 7.0  5.0 - 8.0    Glucose, UA >1000 (*) NEGATIVE (mg/dL)   Hgb urine dipstick SMALL (*) NEGATIVE    Bilirubin Urine NEGATIVE  NEGATIVE    Ketones, ur 15 (*) NEGATIVE (mg/dL)   Protein, ur 100 (*) NEGATIVE (mg/dL)   Urobilinogen, UA 0.2  0.0 - 1.0 (mg/dL)   Nitrite NEGATIVE  NEGATIVE    Leukocytes, UA NEGATIVE  NEGATIVE   URINE MICROSCOPIC-ADD ON      Component Value Range   Squamous Epithelial / LPF RARE  RARE    RBC / HPF 0-2  <3 (RBC/hpf)   Bacteria, UA FEW (*) RARE  GLUCOSE, CAPILLARY      Component Value Range   Glucose-Capillary 470 (*) 70 - 99 (mg/dL)  GLUCOSE, CAPILLARY      Component Value Range   Glucose-Capillary 408 (*) 70 - 99 (mg/dL)   Comment 1 Documented in Chart     Comment 2 Notify RN     8:56 PM Patient has persistent abdominal pain, and nausea.  I will recreate her.  I spoke with the resident on-call for family practice.  He agreed to admit the patient at [facility] to emergency department to the step down unit.  His attending physician is Dr. Erin Hearing  CRITICAL CARE Performed by: Elmer Picker   Total critical care time: 30 min  Critical care time was exclusive of separately billable procedures and treating other patients.  Critical care was necessary to treat or prevent imminent or life-threatening deterioration.  Critical care was time spent personally by me on the following activities: development of treatment plan with patient  and/or surrogate as well as nursing, discussions with consultants, evaluation of patient's response to treatment, examination of patient, obtaining history from patient or surrogate, ordering and performing treatments and interventions, ordering and review of laboratory studies, ordering and review of radiographic studies, pulse oximetry and re-evaluation of patient's condition.  MDM  Abdominal pain, without small bowel obstruction or other surgical problem. Leukocytosis, probably be margination from, vomiting, and diarrhea Hyperglycemia without metabolic acidosis Increased anion gap Dehydration        Elmer Picker, MD 12/05/11 [id number]  Elmer Picker, MD 12/05/11 2101

## 2011-12-05 NOTE — ED Notes (Signed)
Dr Amanda Cockayne notified that the Insulin gtt has been stopped and normally we start D5 fluids below 200 FSBS.  Sts. That he just wants the insulin stopped and not to start and D5 just give her some juice to drink if patient can't tolerate eating a meal.

## 2011-12-05 NOTE — ED Notes (Signed)
Insulin gtt increased per glucose stableizer to 8 units / hour

## 2011-12-05 NOTE — ED Notes (Signed)
(  NSULIN gTT  Adjusted to 7.0 per the glucose stablizer calculation

## 2011-12-05 NOTE — ED Notes (Signed)
R/T from CT via bed insulin gtt still infusing

## 2011-12-06 ENCOUNTER — Encounter (HOSPITAL_COMMUNITY): Payer: Self-pay | Admitting: *Deleted

## 2011-12-06 ENCOUNTER — Ambulatory Visit: Payer: Medicare Other | Admitting: Pharmacist

## 2011-12-06 DIAGNOSIS — E860 Dehydration: Secondary | ICD-10-CM

## 2011-12-06 DIAGNOSIS — R1115 Cyclical vomiting syndrome unrelated to migraine: Secondary | ICD-10-CM

## 2011-12-06 DIAGNOSIS — I10 Essential (primary) hypertension: Secondary | ICD-10-CM

## 2011-12-06 LAB — COMPREHENSIVE METABOLIC PANEL
CO2: 23 mEq/L (ref 19–32)
Calcium: 10.3 mg/dL (ref 8.4–10.5)
Creatinine, Ser: 1.83 mg/dL — ABNORMAL HIGH (ref 0.50–1.10)
GFR calc Af Amer: 37 mL/min — ABNORMAL LOW (ref >90)
GFR calc non Af Amer: 32 mL/min — ABNORMAL LOW (ref >90)
Glucose, Bld: 286 mg/dL — ABNORMAL HIGH (ref 70–99)

## 2011-12-06 LAB — CBC
Hemoglobin: 12.9 g/dL (ref 12.0–15.0)
RBC: 4.02 MIL/uL (ref 3.87–5.11)

## 2011-12-06 LAB — GLUCOSE, CAPILLARY
Glucose-Capillary: 257 mg/dL — ABNORMAL HIGH (ref 70–99)
Glucose-Capillary: 265 mg/dL — ABNORMAL HIGH (ref 70–99)
Glucose-Capillary: 302 mg/dL — ABNORMAL HIGH (ref 70–99)
Glucose-Capillary: 127 mg/dL — ABNORMAL HIGH (ref 70–99)
Glucose-Capillary: 142 mg/dL — ABNORMAL HIGH (ref 70–99)

## 2011-12-06 LAB — CREATININE, SERUM: GFR calc Af Amer: 38 mL/min — ABNORMAL LOW (ref >90)

## 2011-12-06 LAB — MRSA PCR SCREENING: MRSA by PCR: NEGATIVE

## 2011-12-06 MED ORDER — SODIUM CHLORIDE 0.9 % IJ SOLN
INTRAMUSCULAR | Status: AC
  Administered 2011-12-06: 10 mL
  Filled 2011-12-06: qty 10

## 2011-12-06 MED ORDER — CARVEDILOL 12.5 MG PO TABS
12.5000 mg | ORAL_TABLET | Freq: Two times a day (BID) | ORAL | Status: DC
  Filled 2011-12-06 (×3): qty 1

## 2011-12-06 MED ORDER — POTASSIUM CHLORIDE 10 MEQ/100ML IV SOLN: 10.0000 meq | Freq: Once | INTRAVENOUS | Status: DC

## 2011-12-06 MED ORDER — SODIUM CHLORIDE 0.9 % IJ SOLN
INTRAMUSCULAR | Status: AC
  Filled 2011-12-06: qty 10

## 2011-12-06 MED ORDER — LORAZEPAM 2 MG/ML IJ SOLN
1.0000 mg | Freq: Four times a day (QID) | INTRAMUSCULAR | Status: DC | PRN
  Administered 2011-12-06 – 2011-12-07 (×3): 1 mg via INTRAVENOUS
  Filled 2011-12-06 (×3): qty 1

## 2011-12-06 MED ORDER — ALUM & MAG HYDROXIDE-SIMETH 200-200-20 MG/5ML PO SUSP
30.0000 mL | Freq: Four times a day (QID) | ORAL | Status: DC | PRN
  Administered 2011-12-06 – 2011-12-08 (×4): 30 mL via ORAL
  Filled 2011-12-06 (×4): qty 30

## 2011-12-06 MED ORDER — ONDANSETRON 8 MG/NS 50 ML IVPB
8.0000 mg | Freq: Four times a day (QID) | INTRAVENOUS | Status: DC
  Administered 2011-12-06 – 2011-12-07 (×6): 8 mg via INTRAVENOUS
  Filled 2011-12-06 (×8): qty 8

## 2011-12-06 MED ORDER — MORPHINE SULFATE 4 MG/ML IJ SOLN
4.0000 mg | Freq: Four times a day (QID) | INTRAMUSCULAR | Status: DC
  Administered 2011-12-06: 4 mg via INTRAVENOUS
  Filled 2011-12-06: qty 1

## 2011-12-06 MED ORDER — POLYETHYLENE GLYCOL 3350 17 GM/SCOOP PO POWD
17.0000 g | Freq: Every day | ORAL | Status: DC
  Administered 2011-12-08: 17 g via ORAL
  Filled 2011-12-06: qty 255

## 2011-12-06 MED ORDER — DOCUSATE SODIUM 100 MG PO CAPS: 100.0000 mg | ORAL_CAPSULE | Freq: Two times a day (BID) | ORAL | Status: DC | PRN

## 2011-12-06 MED ORDER — HYDROMORPHONE HCL PF 1 MG/ML IJ SOLN
1.0000 mg | INTRAMUSCULAR | Status: DC | PRN
  Administered 2011-12-06 (×3): 1 mg via INTRAVENOUS
  Filled 2011-12-06 (×3): qty 1

## 2011-12-06 MED ORDER — NICOTINE 7 MG/24HR TD PT24
7.0000 mg | MEDICATED_PATCH | Freq: Every day | TRANSDERMAL | Status: DC
  Administered 2011-12-06 – 2011-12-09 (×4): 7 mg via TRANSDERMAL
  Filled 2011-12-06 (×4): qty 1

## 2011-12-06 MED ORDER — INSULIN GLARGINE 100 UNIT/ML ~~LOC~~ SOLN
5.0000 [IU] | Freq: Every day | SUBCUTANEOUS | Status: DC
  Administered 2011-12-07 – 2011-12-08 (×2): 5 [IU] via SUBCUTANEOUS
  Filled 2011-12-06: qty 3

## 2011-12-06 MED ORDER — PROMETHAZINE HCL 25 MG/ML IJ SOLN
25.0000 mg | Freq: Four times a day (QID) | INTRAMUSCULAR | Status: DC
  Administered 2011-12-06 – 2011-12-08 (×10): 25 mg via INTRAVENOUS
  Filled 2011-12-06 (×18): qty 1

## 2011-12-06 MED ORDER — PANTOPRAZOLE SODIUM 40 MG IV SOLR
40.0000 mg | INTRAVENOUS | Status: DC
  Administered 2011-12-06: 40 mg via INTRAVENOUS
  Filled 2011-12-06 (×2): qty 40

## 2011-12-06 MED ORDER — POTASSIUM CHLORIDE IN NACL 40-0.9 MEQ/L-% IV SOLN
INTRAVENOUS | Status: DC
  Administered 2011-12-06 (×2): via INTRAVENOUS
  Administered 2011-12-06: 125 mL/h via INTRAVENOUS
  Administered 2011-12-07 (×2): via INTRAVENOUS
  Administered 2011-12-07: 125 mL/h via INTRAVENOUS
  Administered 2011-12-08: 07:00:00 via INTRAVENOUS
  Filled 2011-12-06 (×11): qty 1000

## 2011-12-06 MED ORDER — ASPIRIN 81 MG PO CHEW
81.0000 mg | CHEWABLE_TABLET | Freq: Every day | ORAL | Status: DC
  Administered 2011-12-07 – 2011-12-09 (×3): 81 mg via ORAL
  Filled 2011-12-06 (×3): qty 1

## 2011-12-06 MED ORDER — MORPHINE SULFATE 2 MG/ML IJ SOLN
2.0000 mg | INTRAMUSCULAR | Status: DC | PRN
  Administered 2011-12-06: 2 mg via INTRAVENOUS
  Filled 2011-12-06: qty 1

## 2011-12-06 MED ORDER — POTASSIUM CHLORIDE 10 MEQ/100ML IV SOLN
INTRAVENOUS | Status: AC
  Administered 2011-12-06: 10 meq via INTRAVENOUS
  Filled 2011-12-06: qty 100

## 2011-12-06 MED ORDER — HYDROMORPHONE HCL PF 1 MG/ML IJ SOLN
1.0000 mg | Freq: Four times a day (QID) | INTRAMUSCULAR | Status: DC | PRN
  Administered 2011-12-06 – 2011-12-07 (×3): 1 mg via INTRAVENOUS
  Filled 2011-12-06 (×3): qty 1

## 2011-12-06 MED ORDER — SUCRALFATE 1 G PO TABS
1.0000 g | ORAL_TABLET | Freq: Three times a day (TID) | ORAL | Status: DC
  Administered 2011-12-06 – 2011-12-09 (×10): 1 g via ORAL
  Filled 2011-12-06 (×17): qty 1

## 2011-12-06 MED ORDER — INSULIN ASPART 100 UNIT/ML ~~LOC~~ SOLN
20.0000 [IU] | Freq: Once | SUBCUTANEOUS | Status: AC
  Administered 2011-12-06: 20 [IU] via SUBCUTANEOUS

## 2011-12-06 MED ORDER — INSULIN ASPART 100 UNIT/ML ~~LOC~~ SOLN
0.0000 [IU] | SUBCUTANEOUS | Status: DC
  Administered 2011-12-06: 2 [IU] via SUBCUTANEOUS
  Administered 2011-12-06 (×2): 8 [IU] via SUBCUTANEOUS
  Administered 2011-12-06: 11 [IU] via SUBCUTANEOUS
  Administered 2011-12-06: 2 [IU] via SUBCUTANEOUS
  Administered 2011-12-07: 8 [IU] via SUBCUTANEOUS
  Filled 2011-12-06: qty 3

## 2011-12-06 MED ORDER — HEPARIN SODIUM (PORCINE) 5000 UNIT/ML IJ SOLN
5000.0000 [IU] | Freq: Three times a day (TID) | INTRAMUSCULAR | Status: DC
  Administered 2011-12-06 – 2011-12-09 (×9): 5000 [IU] via SUBCUTANEOUS
  Filled 2011-12-06 (×13): qty 1

## 2011-12-06 MED ORDER — DULOXETINE HCL 30 MG PO CPEP
30.0000 mg | ORAL_CAPSULE | Freq: Every day | ORAL | Status: DC
  Administered 2011-12-08 – 2011-12-09 (×2): 30 mg via ORAL
  Filled 2011-12-06 (×4): qty 1

## 2011-12-06 MED ORDER — METOPROLOL TARTRATE 1 MG/ML IV SOLN
10.0000 mg | Freq: Two times a day (BID) | INTRAVENOUS | Status: DC
  Administered 2011-12-06 (×2): 10 mg via INTRAVENOUS
  Filled 2011-12-06 (×2): qty 10
  Filled 2011-12-06: qty 5
  Filled 2011-12-06: qty 10

## 2011-12-06 MED ORDER — DIPHENHYDRAMINE HCL 50 MG/ML IJ SOLN
12.5000 mg | Freq: Once | INTRAMUSCULAR | Status: AC
  Administered 2011-12-06: 12.5 mg via INTRAVENOUS
  Filled 2011-12-06: qty 1

## 2011-12-06 NOTE — Progress Notes (Signed)
Inpatient Diabetes Program Recommendations  AACE/ADA: New Consensus Statement on Inpatient Glycemic Control (2009)  Target Ranges:  Prepandial:   less than 140 mg/dL      Peak postprandial:   less than 180 mg/dL (1-2 hours)      Critically ill patients:  140 - 180 mg/dL   Reason for Visit: CBG's up to 400's last night and patient on IV insulin.   Inpatient Diabetes Program Recommendations Insulin - Basal: Consider adding Lantus 12 units daily.  Note:

## 2011-12-06 NOTE — ED Notes (Signed)
D/c'd via stretcher with [facility]

## 2011-12-06 NOTE — Progress Notes (Signed)
Pt's CBG 257; bp 196/97; HR 117.  MD made aware.  New orders received; will continue to monitor.   [name] [name] 12/06/2011 2:05 AM

## 2011-12-06 NOTE — Progress Notes (Signed)
Patient's heart rate increasing from baseline.  Will continue to monitor.    [name] [name] 12/06/2011 05:00 am

## 2011-12-06 NOTE — H&P (Signed)
[facility] Admission History and Physical  Patient name: [name] [name] Medical record number: [id number] Date of birth: [birth year] Age: 45 y.o. Gender: female  Primary Care Provider: Marlana Salvage, MD  Chief Complaint: Nausea vomiting, abdominal pain and diarrhea.   History of Present Illness: [name] [name] is a 45 y.o. year old female well known to the family practice inpatient team. Today she is presenting with her usual symptoms including 2 day history of abdominal pain, nausea, vomiting and diarrhea. She has been unable to keep anything down PO for the past day. She has tried home medications w/o relief. Pt states the only relief she gets is when she is asleep. Pt reports small hematemasis.    Review Of Systems: Per HPI with the following additions:  Negative: fevers, CP, SOB, syncope, rash, HA  Otherwise 12 point review of systems was performed and was unremarkable.  Patient Active Problem List  Diagnoses  . DIABETES MELLITUS, II, COMPLICATIONS  . UNSPECIFIED ANEMIA  . DEPRESSION, MAJOR, RECURRENT  . ANXIETY  . TOBACCO DEPENDENCE  . MIGRAINE, UNSPEC., W/O INTRACTABLE MIGRAINE  . HYPERTENSION, BENIGN SYSTEMIC  . GERD  . BACK PAIN, CHRONIC  . INSOMNIA NOS  . HIGH RISK PATIENT  . MUSCLE SPASM, TRAPEZIUS  . Hot flashes  . Cyclic vomiting syndrome  . CKD (chronic kidney disease), stage III  . Marijuana smoker  . Cramps, extremity   Past Medical History: Past Medical History  Diagnosis Date  . PANCREATITIS 05/09/2008  . NEUROPATHY, DIABETIC 02/23/2007  . Cyclic vomiting syndrome   . Hypertension   . Diabetes mellitus     Past Surgical History: Past Surgical History  Procedure Date  . Port-a-cath placement   . Cholecystectomy   . Abdominal hysterectomy   . Laparotomy and lysis of adhesions     Social History: History   Social History  . Marital Status: Single    Spouse Name: N/A    Number of Children: N/A  . Years of  Education: N/A   Social History Main Topics  . Smoking status: Current Some Day Smoker  . Smokeless tobacco: Never Used   Comment: 1 cig this week  . Alcohol Use: No  . Drug Use: No  . Sexually Active: No   Other Topics Concern  . None   Social History Narrative  . None    Family History: Family History  Problem Relation Age of Onset  . Heart attack Mother 4    death    Allergies: Allergies  Allergen Reactions  . Erythromycin Nausea And Vomiting  . Oxycodone-Acetaminophen Itching    Current Facility-Administered Medications  Medication Dose Route Frequency Provider Last Rate Last Dose  . HYDROmorphone (DILAUDID) injection 1 mg  1 mg Intravenous Once Elmer Picker, MD   1 mg at 12/05/11 1635  . HYDROmorphone (DILAUDID) injection 1 mg  1 mg Intravenous Once Elmer Picker, MD   1 mg at 12/05/11 1908  . HYDROmorphone (DILAUDID) injection 1 mg  1 mg Intravenous Once Elmer Picker, MD   1 mg at 12/05/11 2059  . HYDROmorphone (DILAUDID) injection 1 mg  1 mg Intravenous Once Elmer Picker, MD   1 mg at 12/05/11 2337  . insulin regular (HUMULIN R,NOVOLIN R) 1 Units/mL in sodium chloride 0.9 % 100 mL infusion   Intravenous Continuous Elmer Picker, MD 4.1 mL/hr at 12/05/11 1909 4.1 Units/hr at 12/05/11 1909  . ondansetron (ZOFRAN) injection 4 mg  4 mg Intravenous Once  Elmer Picker, MD   4 mg at 12/05/11 2053  . ondansetron (ZOFRAN) injection 4 mg  4 mg Intravenous Once Elmer Picker, MD   4 mg at 12/05/11 2336  . pantoprazole (PROTONIX) injection 40 mg  40 mg Intravenous Once Elmer Picker, MD   40 mg at 12/05/11 1908  . potassium chloride 10 mEq in 100 mL IVPB  10 mEq Intravenous Once Elmer Picker, MD      . potassium chloride 10 MEQ/100ML IVPB        10 mEq at 12/06/11 0006  . promethazine (PHENERGAN) injection 25 mg  25 mg Intravenous Once Elmer Picker, MD   25 mg at 12/05/11 1644  . promethazine (PHENERGAN)  injection 25 mg  25 mg Intravenous Once Elmer Picker, MD   25 mg at 12/05/11 1908  . sodium chloride 0.9 % bolus 1,000 mL  1,000 mL Intravenous Once Elmer Picker, MD   1,000 mL at 12/05/11 1636  . DISCONTD: potassium chloride 10 mEq in 100 mL IVPB  10 mEq Intravenous Once Elmer Picker, MD         Physical Exam: Filed Vitals:   12/06/11 0040  BP: 176/98  Pulse: 118  Temp: 98.5 F (36.9 C)  Resp: 18   General: alert, cooperative and mild distress HEENT: MMM Heart: S1, S2 normal, no murmur, rub or gallop, regular rate and rhythm Lungs: clear to auscultation, no wheezes or rales and unlabored breathing Abdomen: normal active bowel sounds. Diffuse pain on palpation.  Extremities: extremities normal, atraumatic, no cyanosis or edema and 2+ pulses Skin:no rashes, no ecchymoses, no petechiae, Portacath present in R upper chest Neurology: normal without focal findings and mental status, speech normal, alert and oriented x3  Labs and Imaging: Lab Results  Component Value Date/Time   NA 138 12/05/2011  4:16 PM   K 3.4* 12/05/2011  4:16 PM   CL 97 12/05/2011  4:16 PM   CO2 23 12/05/2011  4:16 PM   BUN 29* 12/05/2011  4:16 PM   CREATININE 1.73* 12/05/2011  4:16 PM   CREATININE 1.67* 11/23/2011  9:38 AM   GLUCOSE 481* 12/05/2011  4:16 PM   Lab Results  Component Value Date   WBC 13.6* 12/05/2011   HGB 11.8* 12/05/2011   HCT 33.8* 12/05/2011   MCV 87.3 12/05/2011   PLT 263 12/05/2011      Assessment and Plan: [name] [name] is a 45 y.o. year old female presenting with 2 day history of N/V/D and abdominal pain.   1. Cyclic Vomiting: No new complaints this admission from multiple recent admissions. Will start usual regimen of Dilaudid 1mg  Q2 and Ativan 1mg  Q6 PRN and zofran 4mg  Q6 and Phenergan Q6 scheduled. Will hydrate until PO returns w/ NS + 74mEq KCL @ 176ml/Hr.  2.  DM: Hgb A1c of 9.5 on Oct 28th.  Most recent blood glucose 103. Low likey due to poor po. Will hold  Lantus until PO returns. Will start moderate SSI. Mild bump in Cr and no PO so will hold metformin for now.   3.  HTN: Baseline hypertensive. Likely elevated now due to no HTN medications for greater than 24hrs due to vomiting and due to distress. Will give Home lisinopril and coreg. And monitor. Will likely decrease as pain, N/V improves  4.  Acute on Chronic Kidney Disease: Mild bump in Cr. Likely from dehydration. Will monitor Should improve w/ IVF. Will recheck BMET tomorrow in the  AM.   5.  ID: Afebrile. White count elevated to 13.5 on admission. W/o complaints. Will monitor for symptoms of infection. UA, UCX, BCX if febrile or becomes symptomatic. Will consider abdominal tap to look for SBP if belly pain becomes acutely worse.   6.  Anxiety/Depression: continue Cymbalta.   7. FEN/GI: NPO now. Will reassess in am. ADAT. Fluids until PO return  8. Prophylaxis: Heparin SQ 5000 TID  9. Disposition: Pending clinical improvement    Signed: MERRELL, DAVID, M.D. Family Medicine Resident PGY-1 12/06/2011 1:11 AM  R2 Addendum to R1 History and Physical  Briefly, [name] [name] is a well known patient to the [facility] Medicine Service with cyclic vomiting and DM who presents unable to tolerate PO, with abdominal pain, diarrhea, hyperglycemia.   BP 176/98  Pulse 118  Temp(Src) 98.5 F (36.9 C) (Oral)  Resp 11  SpO2 100% General appearance: alert and uncomfortable.  Eyes: EOMIT Neck: no JVD and supple, symmetrical, trachea midline Lungs: clear to auscultation bilaterally Heart: regular rate and rhythm, S1, S2 normal, no murmur, click, rub or gallop Abdomen: +BS, soft, diffuse tenderness to palpation.  Extremities: extremities normal, atraumatic, no cyanosis or edema Pulses: 2+ and symmetric Neurologic: Grossly normal  [name] [name] is a 45 year old female well known to the [facility] Medicine Teaching service with past medical history of cyclic vomiting and chronic abdominal pain, poorly  controlled diabetes mellitus type 2, who presents from [facility] ED with 2 days of vomiting, diarrhea, abdominal pain, found to be hyperglycemic in the ED.  1) GI: Cyclic vomiting- will hydrate patient with MIVF, and schedule Zofran and Phenergan.  Will also treat her pain and anxiety with IV Dilaudid and IV Ativan.  She has been admitted many times and had many studies for work up of her symptoms, with no diagnosis found.  At this time will not order any further studies unless indicated by clinical change. She is having diarrhea, which she has had with her symptoms in the past, but not recently.  Please see ID.  2) ID: Patient with diarrhea, also elevated WBC.  C-Diff PCR is ordered.  Will monitor for fever, watch WBC count.  3) DM- Hyperglycemia improved s/p glucomander in ED.  Will do Q4H CBG checks while she is NPO and give SSI.   4) FEN: Will continue to replace K+ and monitor closely.  Pt NPO except Ice Chips.  Will give NS with 40 mEq KCL @ 125 cc/hr.  5) Prophylaxis- SQ heparin for DVT PPX, IV Protonix for GI PPX.  6) Disposition- pending clinical improvement.   Cletus Gash, MD PGY2 12/06/2011 1:43 AM

## 2011-12-06 NOTE — H&P (Signed)
Family Medicine Teaching Service Attending Note  I interviewed and examined patient [name] [name] and reviewed their tests and x-rays.  I discussed with Dr. Barbra Sarks and reviewed their note for today.  I agree with their assessment and plan.     Additionally  Seems to be her usual cyclic vomiting. No signs of focal GI disease on exam or CT except thickening of distal esophagus  Would check to see if esophageal findings have been evaluated in the past - likely due to GERD Consider stopping her ASA since worsens GERD and does not have CAD Continue usual IV medications for nausea or vomiting and pain Treat hypertension conservatively as often becomes low when her vomiting and pain resolves

## 2011-12-06 NOTE — ED Notes (Signed)
[facility] updated on last dose of meds and d/c insulin gtt, along with the hanging of potassium 10 meq

## 2011-12-06 NOTE — Progress Notes (Signed)
At 2000, pt CBG=423. MD notified and will change SSI orders. Will given novolog according to new order and continue to monitor patient. Pt otherwise asymptomatic at this time.

## 2011-12-07 LAB — GLUCOSE, CAPILLARY
Glucose-Capillary: 106 mg/dL — ABNORMAL HIGH (ref 70–99)
Glucose-Capillary: 203 mg/dL — ABNORMAL HIGH (ref 70–99)
Glucose-Capillary: 32 mg/dL — CL (ref 70–99)
Glucose-Capillary: 266 mg/dL — ABNORMAL HIGH (ref 70–99)
Glucose-Capillary: 106 mg/dL — ABNORMAL HIGH (ref 70–99)
Glucose-Capillary: 248 mg/dL — ABNORMAL HIGH (ref 70–99)

## 2011-12-07 MED ORDER — PANTOPRAZOLE SODIUM 40 MG IV SOLR
40.0000 mg | Freq: Two times a day (BID) | INTRAVENOUS | Status: DC
  Administered 2011-12-07 – 2011-12-09 (×4): 40 mg via INTRAVENOUS
  Filled 2011-12-07 (×6): qty 40

## 2011-12-07 MED ORDER — CYCLOBENZAPRINE HCL 10 MG PO TABS
10.0000 mg | ORAL_TABLET | Freq: Three times a day (TID) | ORAL | Status: DC
  Administered 2011-12-07 – 2011-12-09 (×7): 10 mg via ORAL
  Filled 2011-12-07 (×10): qty 1

## 2011-12-07 MED ORDER — HYDROMORPHONE HCL PF 1 MG/ML IJ SOLN
1.0000 mg | INTRAMUSCULAR | Status: DC
  Administered 2011-12-07: 1 mg via INTRAVENOUS
  Filled 2011-12-07: qty 1

## 2011-12-07 MED ORDER — ONDANSETRON 8 MG/NS 50 ML IVPB
8.0000 mg | Freq: Four times a day (QID) | INTRAVENOUS | Status: DC | PRN
  Filled 2011-12-07: qty 8

## 2011-12-07 MED ORDER — INSULIN ASPART 100 UNIT/ML ~~LOC~~ SOLN: 0.0000 [IU] | Freq: Three times a day (TID) | SUBCUTANEOUS | Status: DC

## 2011-12-07 MED ORDER — SENNA 8.6 MG PO TABS
1.0000 | ORAL_TABLET | Freq: Every day | ORAL | Status: DC
  Administered 2011-12-07 – 2011-12-08 (×2): 8.6 mg via ORAL
  Filled 2011-12-07 (×2): qty 1

## 2011-12-07 MED ORDER — DEXTROSE 50 % IV SOLN
INTRAVENOUS | Status: AC
  Administered 2011-12-07: 25 mL
  Filled 2011-12-07: qty 50

## 2011-12-07 MED ORDER — HYDROMORPHONE HCL PF 1 MG/ML IJ SOLN
1.0000 mg | Freq: Once | INTRAMUSCULAR | Status: AC
  Administered 2011-12-07: 1 mg via INTRAVENOUS
  Filled 2011-12-07: qty 1

## 2011-12-07 MED ORDER — INSULIN ASPART 100 UNIT/ML ~~LOC~~ SOLN
0.0000 [IU] | Freq: Three times a day (TID) | SUBCUTANEOUS | Status: DC
  Administered 2011-12-07: 2 [IU] via SUBCUTANEOUS
  Administered 2011-12-08: 3 [IU] via SUBCUTANEOUS
  Administered 2011-12-08 (×2): 5 [IU] via SUBCUTANEOUS
  Administered 2011-12-09: 3 [IU] via SUBCUTANEOUS

## 2011-12-07 MED ORDER — HYDROMORPHONE HCL PF 1 MG/ML IJ SOLN
1.0000 mg | INTRAMUSCULAR | Status: DC
  Administered 2011-12-08 (×3): 1 mg via INTRAVENOUS
  Filled 2011-12-07 (×4): qty 1

## 2011-12-07 MED ORDER — HYDROMORPHONE HCL PF 1 MG/ML IJ SOLN: 1.0000 mg | Freq: Four times a day (QID) | INTRAMUSCULAR | Status: DC

## 2011-12-07 MED ORDER — SODIUM CHLORIDE 0.9 % IJ SOLN
INTRAMUSCULAR | Status: AC
  Filled 2011-12-07: qty 10

## 2011-12-07 MED ORDER — DIPHENHYDRAMINE HCL 50 MG/ML IJ SOLN
12.5000 mg | Freq: Three times a day (TID) | INTRAMUSCULAR | Status: DC | PRN
Start: 2011-12-07 — End: 2011-12-08
  Administered 2011-12-07: 12.5 mg via INTRAVENOUS
  Filled 2011-12-07: qty 1

## 2011-12-07 MED ORDER — LORAZEPAM 2 MG/ML IJ SOLN
1.0000 mg | INTRAMUSCULAR | Status: DC
  Administered 2011-12-07: 1 mg via INTRAVENOUS
  Filled 2011-12-07: qty 1

## 2011-12-07 MED ORDER — CARVEDILOL 12.5 MG PO TABS
12.5000 mg | ORAL_TABLET | Freq: Two times a day (BID) | ORAL | Status: DC
  Administered 2011-12-07 – 2011-12-09 (×5): 12.5 mg via ORAL
  Filled 2011-12-07 (×7): qty 1

## 2011-12-07 MED ORDER — LORAZEPAM 2 MG/ML IJ SOLN
1.0000 mg | Freq: Three times a day (TID) | INTRAMUSCULAR | Status: DC
  Administered 2011-12-07 – 2011-12-08 (×2): 1 mg via INTRAVENOUS
  Filled 2011-12-07 (×2): qty 1

## 2011-12-07 MED ORDER — INSULIN ASPART 100 UNIT/ML ~~LOC~~ SOLN
0.0000 [IU] | Freq: Every day | SUBCUTANEOUS | Status: DC
  Administered 2011-12-07: 2 [IU] via SUBCUTANEOUS
  Administered 2011-12-08: 4 [IU] via SUBCUTANEOUS

## 2011-12-07 NOTE — Progress Notes (Signed)
Inpatient Diabetes Program Recommendations  AACE/ADA: New Consensus Statement on Inpatient Glycemic Control (2009)  Target Ranges:  Prepandial:   less than 140 mg/dL      Peak postprandial:   less than 180 mg/dL (1-2 hours)      Critically ill patients:  140 - 180 mg/dL   Reason for Visit: Note low CBG=32 mg/dL overnight.  It appears according to documentation that last pm (12/06/11) patient received Novolog 8 units at 1819 and then Novolog 20 units again at 2001. Consider starting low dose basal insulin to prevent hyperglycemia and decrease correction to sensitive.  Inpatient Diabetes Program Recommendations Insulin - Basal: Consider adding Lantus 12 units daily. Correction (SSI): Decrease correction to sensitive.  Note:

## 2011-12-07 NOTE — Progress Notes (Signed)
I have seen and examined this patient. I have discussed with Dr Marily Memos.  I agree with their findings and plans as documented in their progress note for today.  Will schedule Dilaudid to 1 mg every six hrs, Ativan 1 mg every 8 hrs, and Phenergan every six hrs for next 24 hours, with goal of transitioning to PRN use of dilaudid, ativan and phenergan.

## 2011-12-07 NOTE — Progress Notes (Signed)
[name] [name]  Patient name: [name] [name] Medical record number: [id number] Date of birth: May 25, 1966 Age: 45 y.o. Gender: female    LOS: 2 days   Primary Care Provider: Marlana Salvage, MD  Overnight Events: Episode of hypoglycemia to 32. Given D50 25 MLS IV with followup CBG to 106. Continues to have nausea and abdominal pain though improved this morning. Complaining of left-sided back pain.  Objective: Vital signs in last 24 hours: Temp:  [98 F (36.7 C)-98.5 F (36.9 C)] 98 F (36.7 C) (12/11 0800) Pulse Rate:  [78-106] 98  (12/11 0800) Resp:  [18-26] 24  (12/11 0800) BP: (135-198)/(65-99) 181/92 mmHg (12/11 0800) SpO2:  [97 %-100 %] 99 % (12/11 0800)  Wt Readings from Last 3 Encounters:  12/06/11 166 lb 3.6 oz (75.4 kg)  11/23/11 171 lb 14.4 oz (77.973 kg)  11/03/11 177 lb (80.287 kg)     Current Facility-Administered Medications  Medication Dose Route Frequency Provider Last Rate Last Dose  . 0.9 % NaCl with KCl 40 mEq / L  infusion   Intravenous Continuous Cletus Gash, MD 125 mL/hr at 12/07/11 0800    . alum & mag hydroxide-simeth (MAALOX/MYLANTA) 200-200-20 MG/5ML suspension 30 mL  30 mL Oral Q6H PRN Cletus Gash, MD   30 mL at 12/07/11 0829  . aspirin chewable tablet 81 mg  81 mg Oral Daily Cletus Gash, MD      . carvedilol (COREG) tablet 12.5 mg  12.5 mg Oral BID WC Linna Darner, MD      . cyclobenzaprine (FLEXERIL) tablet 10 mg  10 mg Oral TID Linna Darner, MD      . dextrose 50 % solution        25 mL at 12/07/11 0102  . diphenhydrAMINE (BENADRYL) injection 12.5 mg  12.5 mg Intravenous Once Dayarmys Piloto, MD   12.5 mg at 12/06/11 1514  . docusate sodium (COLACE) capsule 100 mg  100 mg Oral BID PRN Cletus Gash, MD      . DULoxetine (CYMBALTA) DR capsule 30 mg  30 mg Oral Daily Cletus Gash, MD      . heparin injection 5,000 Units  5,000 Units Subcutaneous Q8H Cletus Gash, MD    5,000 Units at 12/07/11 0610  . HYDROmorphone (DILAUDID) injection 1 mg  1 mg Intravenous QID PRN Dayarmys Piloto, MD   1 mg at 12/07/11 0610  . insulin aspart (novoLOG) injection 0-15 Units  0-15 Units Subcutaneous Q4H Cletus Gash, MD   8 Units at 12/07/11 [phone]  . insulin aspart (novoLOG) injection 20 Units  20 Units Subcutaneous Once Dayarmys Piloto, MD   20 Units at 12/06/11 2041  . insulin glargine (LANTUS) injection 5 Units  5 Units Subcutaneous QHS Dayarmys Piloto, MD      . LORazepam (ATIVAN) injection 1 mg  1 mg Intravenous Q6H PRN Cletus Gash, MD   1 mg at 12/06/11 1545  . nicotine (NICODERM CQ - dosed in mg/24 hr) patch 7 mg  7 mg Transdermal Daily Cletus Gash, MD   7 mg at 12/06/11 1031  . ondansetron (ZOFRAN) 8 mg/NS 50 ml IVPB  8 mg Intravenous Q6H Cletus Gash, MD   8 mg at 12/07/11 0401  . pantoprazole (PROTONIX) injection 40 mg  40 mg Intravenous Q24H Cletus Gash, MD   40 mg at 12/06/11 2245  . polyethylene glycol powder (GLYCOLAX/MIRALAX) container 17 g  17 g Oral Daily Cletus Gash, MD      . potassium chloride 10  mEq in 100 mL IVPB  10 mEq Intravenous Once Elmer Picker, MD      . promethazine (PHENERGAN) injection 25 mg  25 mg Intravenous Q6H Cletus Gash, MD   25 mg at 12/07/11 0657  . sodium chloride 0.9 % injection           . sodium chloride 0.9 % injection        10 mL at 12/06/11 1947  . sucralfate (CARAFATE) tablet 1 g  1 g Oral TID WC & HS Cletus Gash, MD   1 g at 12/07/11 0824  . DISCONTD: HYDROmorphone (DILAUDID) injection 1 mg  1 mg Intravenous Q2H PRN Cletus Gash, MD   1 mg at 12/06/11 0836  . DISCONTD: metoprolol (LOPRESSOR) injection 10 mg  10 mg Intravenous Q12H Dayarmys Piloto, MD   10 mg at 12/06/11 2245  . DISCONTD: morphine 2 MG/ML injection 2 mg  2 mg Intravenous Q2H PRN Marlana Salvage   2 mg at 12/06/11 1413  . DISCONTD: morphine 4 MG/ML injection 4 mg  4 mg Intravenous Q6H Rachel Spiegel   4  mg at 12/06/11 1120     PE: Gen: No acute distress, well-nourished well-developed HEENT: Moist because membranes CV: Regular rate and rhythm, no murmurs rubs or gallops Res: Clear to auscultation bilaterally Abd: Normal active bowel sounds, diffuse pain on palpation Ext/Musc: No edema. 2+ pulses throughout. Left-sided lower and middle back pain on palpation, Portacath present in R upper chest Neuro: Cranial nerves grossly intact  Labs/Studies:  CBG (last 4)  12/06/11 CBG 19:57, 423 12/07/11 CBG 00:53, 32  Basename 12/07/11 0730 12/07/11 0345 12/07/11 0131  GLUCAP 266* 203* 106*      Assessment/Plan: [name] [name] is a 45 y.o. year old female presenting with 4 day history of N/V/D and abdominal pain.   1. Cyclic Vomiting: continues to have nausea, with limited emesis. Pain is improved this morning. Pt lying comfortably in bed at time of exam but became progressively uncomfortable during assessment. Some psychosomatic component. Tolerating clear liquid diet. Continue with current pain/nausea regimen of zofran, phenergan, dilaudid, and ativan.   2. DM: Hgb A1c of 9.5 on Oct 28th. Elevated CBG last night, given 20 units novolog. Repeat CBG 32 5 hours later. Given Dextrose 50 8mL. Pt likely brittle diabetic. Will change to mild SSI. Mild bump in Cr and no PO so will hold metformin for now.   3. HTN: Baseline hypertensive. Elevation from moderate distress  Associated w/ nausea, and abdominal pain. Taking PO so will change to home carvedilol dose and monitor. Will likely decrease as pain, N/V improves.  4. Acute on Chronic Kidney Disease: Mild bump in Cr. Likely from dehydration. Will monitor.  5. ID: Afebrile. Elevated white count likely from demargination of White cells from nausea.  W/o complaints. Will monitor for symptoms of infection. UA, UCX, BCX if febrile or becomes symptomatic.   6. Anxiety/Depression: continue Cymbalta.   7. FEN/GI: Advance to carb modified diet. Will  reassess in am. ADAT. Fluids until PO return   8. Prophylaxis: Heparin SQ 5000 TID   9. Disposition: Pending clinical improvement     Signed: Linna Darner, MD Family Medicine Resident PGY-1 12/07/2011 8:53 AM

## 2011-12-07 NOTE — Progress Notes (Signed)
CBG: 32  Treatment: D50 IV 25 mL  Symptoms: None  Follow-up CBG: Time:0135 CBG Result:106  Possible Reasons for Event: Vomiting and Inadequate meal intake  Comments/MD notified:MD notified    Hitt, Tobie Lords

## 2011-12-08 ENCOUNTER — Other Ambulatory Visit: Payer: Self-pay

## 2011-12-08 LAB — BASIC METABOLIC PANEL WITH GFR
BUN: 11 mg/dL (ref 6–23)
BUN: 11 mg/dL (ref 6–23)
CO2: 23 meq/L (ref 19–32)
CO2: 25 meq/L (ref 19–32)
Calcium: 9 mg/dL (ref 8.4–10.5)
Calcium: 8.5 mg/dL (ref 8.4–10.5)
Chloride: 104 meq/L (ref 96–112)
Chloride: 101 meq/L (ref 96–112)
Creatinine, Ser: 1.52 mg/dL — ABNORMAL HIGH (ref 0.50–1.10)
Creatinine, Ser: 1.51 mg/dL — ABNORMAL HIGH (ref 0.50–1.10)
GFR calc Af Amer: 46 mL/min — ABNORMAL LOW
GFR calc Af Amer: 47 mL/min — ABNORMAL LOW
GFR calc non Af Amer: 40 mL/min — ABNORMAL LOW
GFR calc non Af Amer: 40 mL/min — ABNORMAL LOW
Glucose, Bld: 250 mg/dL — ABNORMAL HIGH (ref 70–99)
Glucose, Bld: 267 mg/dL — ABNORMAL HIGH (ref 70–99)
Potassium: 5.9 meq/L — ABNORMAL HIGH (ref 3.5–5.1)
Potassium: 4.6 meq/L (ref 3.5–5.1)
Sodium: 134 meq/L — ABNORMAL LOW (ref 135–145)
Sodium: 132 meq/L — ABNORMAL LOW (ref 135–145)

## 2011-12-08 LAB — GLUCOSE, CAPILLARY
Glucose-Capillary: 299 mg/dL — ABNORMAL HIGH (ref 70–99)
Glucose-Capillary: 300 mg/dL — ABNORMAL HIGH (ref 70–99)
Glucose-Capillary: 325 mg/dL — ABNORMAL HIGH (ref 70–99)

## 2011-12-08 MED ORDER — LORAZEPAM 2 MG/ML IJ SOLN: 1.0000 mg | Freq: Four times a day (QID) | INTRAMUSCULAR | Status: DC | PRN

## 2011-12-08 MED ORDER — DIPHENHYDRAMINE HCL 25 MG PO CAPS: 25.0000 mg | ORAL_CAPSULE | Freq: Three times a day (TID) | ORAL | Status: DC | PRN

## 2011-12-08 MED ORDER — PROMETHAZINE HCL 25 MG PO TABS
25.0000 mg | ORAL_TABLET | Freq: Four times a day (QID) | ORAL | Status: DC | PRN
  Administered 2011-12-09: 25 mg via ORAL
  Filled 2011-12-08: qty 1

## 2011-12-08 MED ORDER — SODIUM CHLORIDE 0.9 % IV SOLN: INTRAVENOUS | Status: DC

## 2011-12-08 MED ORDER — OXYCODONE HCL 5 MG PO TABS
10.0000 mg | ORAL_TABLET | ORAL | Status: DC | PRN
  Administered 2011-12-08 (×2): 10 mg via ORAL
  Filled 2011-12-08: qty 1
  Filled 2011-12-08 (×2): qty 2
  Filled 2011-12-08: qty 1

## 2011-12-08 MED ORDER — PROMETHAZINE HCL 25 MG/ML IJ SOLN: 25.0000 mg | Freq: Four times a day (QID) | INTRAMUSCULAR | Status: DC | PRN

## 2011-12-08 MED ORDER — LORAZEPAM 1 MG PO TABS
1.0000 mg | ORAL_TABLET | ORAL | Status: DC | PRN
  Administered 2011-12-09: 1 mg via ORAL
  Filled 2011-12-08: qty 1

## 2011-12-08 MED ORDER — GI COCKTAIL ~~LOC~~
30.0000 mL | Freq: Once | ORAL | Status: AC
  Administered 2011-12-08: 30 mL via ORAL
  Filled 2011-12-08: qty 30

## 2011-12-08 MED ORDER — PHENOL 1.4 % MT LIQD
1.0000 | OROMUCOSAL | Status: DC | PRN
  Filled 2011-12-08 (×2): qty 177

## 2011-12-08 MED ORDER — SODIUM CHLORIDE 0.9 % IJ SOLN
10.0000 mL | INTRAMUSCULAR | Status: DC | PRN
  Administered 2011-12-08: 10 mL

## 2011-12-08 MED ORDER — SENNA 8.6 MG PO TABS
2.0000 | ORAL_TABLET | Freq: Two times a day (BID) | ORAL | Status: DC
  Administered 2011-12-08 – 2011-12-09 (×2): 17.2 mg via ORAL
  Filled 2011-12-08 (×3): qty 2

## 2011-12-08 MED ORDER — ONDANSETRON HCL 4 MG PO TABS
4.0000 mg | ORAL_TABLET | Freq: Four times a day (QID) | ORAL | Status: DC | PRN
  Administered 2011-12-08: 4 mg via ORAL
  Filled 2011-12-08: qty 1

## 2011-12-08 MED ORDER — SODIUM POLYSTYRENE SULFONATE 15 GM/60ML PO SUSP
15.0000 g | ORAL | Status: AC
  Administered 2011-12-08: 15 g via ORAL
  Filled 2011-12-08: qty 60

## 2011-12-08 NOTE — Progress Notes (Signed)
[facility] Progress Note  Patient name: [name] [name] Medical record number: [id number] Date of birth: 23-Aug-1966 Age: 45 y.o. Gender: female    LOS: 3 days   Primary Care Provider: Marlana Salvage, MD  Overnight Events: Abdominal pain and nausea much improved this morning. Slept well. Tolerating regular diet. No BM. Complaining of some reflux w/ esophageal irritation. Back pain significantly improved   Objective: Vital signs in last 24 hours: Temp:  [97.9 F (36.6 C)-98.6 F (37 C)] 98.6 F (37 C) (12/12 0416) Pulse Rate:  [73-85] 74  (12/12 0416) Resp:  [20-22] 22  (12/12 0416) BP: (165-185)/(89-101) 165/92 mmHg (12/12 0416) SpO2:  [98 %-100 %] 99 % (12/12 0416)  Wt Readings from Last 3 Encounters:  12/06/11 166 lb 3.6 oz (75.4 kg)  11/23/11 171 lb 14.4 oz (77.973 kg)  11/03/11 177 lb (80.287 kg)     Current Facility-Administered Medications  Medication Dose Route Frequency Provider Last Rate Last Dose  . 0.9 % NaCl with KCl 40 mEq / L  infusion   Intravenous Continuous Cletus Gash, MD 125 mL/hr at 12/08/11 0710    . alum & mag hydroxide-simeth (MAALOX/MYLANTA) 200-200-20 MG/5ML suspension 30 mL  30 mL Oral Q6H PRN Cletus Gash, MD   30 mL at 12/08/11 0417  . aspirin chewable tablet 81 mg  81 mg Oral Daily Cletus Gash, MD   81 mg at 12/07/11 0914  . carvedilol (COREG) tablet 12.5 mg  12.5 mg Oral BID WC Linna Darner, MD   12.5 mg at 12/08/11 0844  . cyclobenzaprine (FLEXERIL) tablet 10 mg  10 mg Oral TID Linna Darner, MD   10 mg at 12/07/11 2236  . diphenhydrAMINE (BENADRYL) injection 12.5 mg  12.5 mg Intravenous Q8H PRN Josalyn Funches   12.5 mg at 12/07/11 2223  . DULoxetine (CYMBALTA) DR capsule 30 mg  30 mg Oral Daily Cletus Gash, MD      . gi cocktail  30 mL Oral Once Josalyn Funches      . heparin injection 5,000 Units  5,000 Units Subcutaneous Q8H Cletus Gash, MD   5,000 Units at 12/07/11 2228  .  HYDROmorphone (DILAUDID) injection 1 mg  1 mg Intravenous Once Lind Covert, MD   1 mg at 12/07/11 1700  . HYDROmorphone (DILAUDID) injection 1 mg  1 mg Intravenous Q4H Josalyn Funches   1 mg at 12/08/11 0815  . HYDROmorphone (DILAUDID) injection 1 mg  1 mg Intravenous Once Lind Covert, MD   1 mg at 12/07/11 2222  . insulin aspart (novoLOG) injection 0-5 Units  0-5 Units Subcutaneous QHS Linna Darner, MD   2 Units at 12/07/11 2235  . insulin aspart (novoLOG) injection 0-9 Units  0-9 Units Subcutaneous TID WC Deboraha Sprang, PHARMD   5 Units at 12/08/11 [phone]  . insulin glargine (LANTUS) injection 5 Units  5 Units Subcutaneous QHS Dayarmys Piloto, MD   5 Units at 12/07/11 2231  . LORazepam (ATIVAN) injection 1 mg  1 mg Intravenous Q8H Todd D McDiarmid, MD   1 mg at 12/08/11 0653  . nicotine (NICODERM CQ - dosed in mg/24 hr) patch 7 mg  7 mg Transdermal Daily Cletus Gash, MD   7 mg at 12/07/11 0913  . ondansetron (ZOFRAN) 8 mg/NS 50 ml IVPB  8 mg Intravenous Q6H PRN Linna Darner, MD      . pantoprazole (PROTONIX) injection 40 mg  40 mg Intravenous Q12H Blane Ohara McDiarmid, MD   40 mg  at 12/07/11 2227  . polyethylene glycol powder (GLYCOLAX/MIRALAX) container 17 g  17 g Oral Daily Cletus Gash, MD      . potassium chloride 10 mEq in 100 mL IVPB  10 mEq Intravenous Once Elmer Picker, MD      . promethazine (PHENERGAN) injection 25 mg  25 mg Intravenous Q6H Cletus Gash, MD   25 mg at 12/08/11 0413  . senna (SENOKOT) tablet 8.6 mg  1 tablet Oral Daily Linna Darner, MD   8.6 mg at 12/07/11 1142  . sodium chloride 0.9 % injection 10 mL  10 mL Intracatheter PRN Lind Covert, MD   10 mL at 12/08/11 0640  . sodium chloride 0.9 % injection           . sucralfate (CARAFATE) tablet 1 g  1 g Oral TID WC & HS Cletus Gash, MD   1 g at 12/08/11 0843  . DISCONTD: docusate sodium (COLACE) capsule 100 mg  100 mg Oral BID PRN Cletus Gash, MD      .  DISCONTD: HYDROmorphone (DILAUDID) injection 1 mg  1 mg Intravenous QID PRN Dayarmys Piloto, MD   1 mg at 12/07/11 0610  . DISCONTD: HYDROmorphone (DILAUDID) injection 1 mg  1 mg Intravenous Q4H Linna Darner, MD   1 mg at 12/07/11 1143  . DISCONTD: HYDROmorphone (DILAUDID) injection 1 mg  1 mg Intravenous Q6H Todd D McDiarmid, MD      . DISCONTD: insulin aspart (novoLOG) injection 0-15 Units  0-15 Units Subcutaneous Q4H Cletus Gash, MD   8 Units at 12/07/11 [phone]  . DISCONTD: insulin aspart (novoLOG) injection 0-9 Units  0-9 Units Subcutaneous TID WC Linna Darner, MD      . DISCONTD: LORazepam (ATIVAN) injection 1 mg  1 mg Intravenous Q6H PRN Cletus Gash, MD   1 mg at 12/07/11 0914  . DISCONTD: LORazepam (ATIVAN) injection 1 mg  1 mg Intravenous Q4H Linna Darner, MD   1 mg at 12/07/11 1330  . DISCONTD: ondansetron (ZOFRAN) 8 mg/NS 50 ml IVPB  8 mg Intravenous Q6H Cletus Gash, MD   8 mg at 12/07/11 1003  . DISCONTD: pantoprazole (PROTONIX) injection 40 mg  40 mg Intravenous Q24H Cletus Gash, MD   40 mg at 12/06/11 2245     PE: Gen: No acute distress, well-nourished well-developed  HEENT: Moist because membranes  CV: Regular rate and rhythm, no murmurs rubs or gallops  Res: Clear to auscultation bilaterally  Abd: Normal active bowel sounds, diffuse pain on palpation  Ext/Musc: No edema. 2+ pulses throughout.  Portacath present in R upper chest  Neuro: Cranial nerves grossly intact   Labs/Studies:   Basic Metabolic Panel:    Component Value Date/Time   NA 134* 12/08/2011 0640   K 5.9* 12/08/2011 0640   CL 104 12/08/2011 0640   CO2 23 12/08/2011 0640   BUN 11 12/08/2011 0640   CREATININE 1.52* 12/08/2011 0640   CREATININE 1.67* 11/23/2011 0938   GLUCOSE 250* 12/08/2011 0640   CALCIUM 9.0 12/08/2011 0640     Assessment/Plan: [name] [name] is a 45 y.o. year old female presenting with 4 day history of N/V/D and abdominal pain.   1. Cyclic Vomiting:  continues to have nausea and abdominal pain. Pain is improved this morning. Pt lying comfortably in bed at time of exam. Tolerating full diet.  Will discuss cutting down pain/nausea regimen of zofran, phenergan, dilaudid, and ativan with team.  2. DM: Hgb A1c of 9.5 on Oct 28th.  Brittle poorly controlled diabetic. Pt did much better on sensitive sliding scale. Will continue w/ current regimen. Will consider adding home metformin as Cr improves.  3. HTN: Baseline hypertensive. Much improved since admission as symptoms have improved. Continue home Carvedilol. .   4. Acute on Chronic Kidney Disease: Cr improving. Will monitor.   5. ID: Afebrile. Elevated white count likely from demargination of White cells from nausea. W/o complaints. Will monitor for symptoms of infection.   6. Anxiety/Depression: continue Cymbalta.   7. FEN/GI: Tolerating carb modified diet. Will DC fluids as taking adequate PO. Hyperkalemic today. Likely in part due to poor kidney function and K replacement in fluids as well as returned PO. Will give kayexalate x1 and EKG now. Will DC fluids. Will recheck BMET in PM  8. Prophylaxis: Heparin SQ 5000 TID   9. Disposition: Pending clinical improvement        Signed: Linna Darner, MD Family Medicine Resident PGY-1 12/08/2011 9:23 AM

## 2011-12-08 NOTE — Progress Notes (Signed)
Inpatient Diabetes Program Recommendations  AACE/ADA: New Consensus Statement on Inpatient Glycemic Control (2009)  Target Ranges:  Prepandial:   less than 140 mg/dL      Peak postprandial:   less than 180 mg/dL (1-2 hours)      Critically ill patients:  140 - 180 mg/dL   Reason for Visit:Hyperglycemia: both fasting and post-prandial  Inpatient Diabetes Program Recommendations Insulin - Basal: Pt takes Lantus/basal insulin 22 units daily at home. Please give pt at least 15 units here. Correction (SSI): Decrease correction to sensitive. Insulin - Meal Coverage: Pt takes 6 units tidwc for meals at home.  Please add 4 units while here to avoid high doses of correction which presents potential of hypoglycemia.  Note: Pt takes Lantus 22 units qam  plus Novolog 6 units meal coverage tidwc.Marland Kitchen   Rosita Kea, RN, CNS Diabetes Coordinator

## 2011-12-08 NOTE — Progress Notes (Signed)
I have discussed with Dr Marily Memos.  I agree with their findings and plans as documented in their progress note for today.

## 2011-12-09 ENCOUNTER — Telehealth: Payer: Self-pay | Admitting: *Deleted

## 2011-12-09 MED ORDER — INSULIN GLARGINE 100 UNIT/ML ~~LOC~~ SOLN: 10.0000 [IU] | SUBCUTANEOUS | Status: DC

## 2011-12-09 MED ORDER — PHENOL 1.4 % MT LIQD: 1.0000 | OROMUCOSAL | Status: DC | PRN

## 2011-12-09 MED ORDER — OMEPRAZOLE 20 MG PO CPDR: 40.0000 mg | DELAYED_RELEASE_CAPSULE | Freq: Every day | ORAL | Status: DC

## 2011-12-09 MED ORDER — CARVEDILOL 12.5 MG PO TABS: 12.5000 mg | ORAL_TABLET | Freq: Two times a day (BID) | ORAL | Status: DC

## 2011-12-09 MED ORDER — LORAZEPAM 1 MG PO TABS: 1.0000 mg | ORAL_TABLET | ORAL | Status: DC | PRN

## 2011-12-09 MED ORDER — HEPARIN SOD (PORK) LOCK FLUSH 100 UNIT/ML IV SOLN
500.0000 [IU] | INTRAVENOUS | Status: AC | PRN
  Administered 2011-12-09: 500 [IU]

## 2011-12-09 MED ORDER — OXYCODONE HCL 10 MG PO TABS: 10.0000 mg | ORAL_TABLET | ORAL | Status: DC | PRN

## 2011-12-09 MED ORDER — SENNA 8.6 MG PO TABS: 2.0000 | ORAL_TABLET | Freq: Two times a day (BID) | ORAL | Status: DC

## 2011-12-09 MED ORDER — ONDANSETRON HCL 4 MG PO TABS: 4.0000 mg | ORAL_TABLET | Freq: Four times a day (QID) | ORAL | Status: AC | PRN

## 2011-12-09 NOTE — Telephone Encounter (Signed)
PA required for ondansetron. Form given to Dr. Erin Hearing for completion.

## 2011-12-09 NOTE — Progress Notes (Signed)
NOTE ENTERED IN ERROR. PT DISCHARGED

## 2011-12-09 NOTE — Discharge Summary (Signed)
Family Medicine Resident Discharge Summary  Patient ID: [name] [name] [id number] 45 y.o. [birth year]  Admit date: 12/05/2011  Discharge date and time: No discharge date for patient encounter.   Admitting Physician: Lind Covert, MD   Discharge Physician: Sherren Mocha McDiarmid, MD  Admission Diagnoses: Abdominal pain [789.0] Dehydration [276.51] Leukocytosis [288.60] Vomiting [787.03] Tachycardia [785.0] Hyperglycemia [790.29] DIARHEA  Discharge Diagnoses: Cyclic vomiting, Dehydration, hyperglycemia  Admission Condition: fair  Discharged Condition: good  Indication for Admission: CBG of 481, severe abdominal pain, N/V, and dehydration  Hospital Course: [name] [name] is a 45 y.o. year old female well known to the family practice presenting with her usual symptoms including 2 day history of abdominal pain, nausea, vomiting and diarrhea.   Abdominal pain: started on usual regimen of IV dilaudid, ativan and phenergan scheduled, and zofran prn. As pain improved pt was transitioned to home regimen. N/V improved throughout hospitalization and minimal at time of DC. Initially NPO but advanced as tolerated. On full diet w/o emesis at time of DC.   DM: pt arrived on glucomander but was quickly changed to SSI. Pt glucose levels followed closely and inpt team found pt best maintained on a sensitive sliding scale novolog.   Reflux: Pt placed on protonix during admission, adn given GI cocktail when complaining of reflux w/ relief. CT findings noted below.   ID: elevated WBC thought to be due to demargination of WBC due to persistent nausea. Pt afebrile during hospital stay.     Consults: none  Significant Diagnostic Studies:  WBC on admission 13.6 K: 3.4 Cr:.73  CT abdomen: IMPRESSION:  1. Marked wall thickening of the distal esophagus cannot be  definitively characterized but is most consistent with inflammatory  change.  2. No acute finding in the abdomen. Specifically,  negative for  small bowel obstruction.  3. Status post cholecystectomy and hysterectomy.    Discharge Exam: Gen: No acute distress, well-nourished well-developed  HEENT: Moist because membranes  CV: Regular rate and rhythm, no murmurs rubs or gallops  Res: Clear to auscultation bilaterally  Abd: Normal active bowel sounds, diffuse pain on palpation  Ext/Musc: No edema. 2+ pulses throughout. Portacath present in R upper chest  Neuro: Cranial nerves grossly intact   Disposition: home  Patient Instructions:  Current Discharge Medication List    START taking these medications   Details  !! carvedilol (COREG) 12.5 MG tablet Take 1 tablet (12.5 mg total) by mouth 2 (two) times daily with a meal. Qty: 30 tablet, Refills: 0    ondansetron (ZOFRAN) 4 MG tablet Take 1 tablet (4 mg total) by mouth every 6 (six) hours as needed. Qty: 30 tablet, Refills: 3    phenol (CHLORASEPTIC) 1.4 % LIQD Use as directed 1 spray in the mouth or throat as needed. Qty: 1 Bottle, Refills: 3    senna (SENOKOT) 8.6 MG TABS Take 2 tablets (17.2 mg total) by mouth 2 (two) times daily. For constipation Qty: 60 each, Refills: 3     !! - Potential duplicate medications found. Please discuss with provider.    CONTINUE these medications which have CHANGED   Details  insulin glargine (LANTUS) 100 UNIT/ML injection Inject 10 Units into the skin every morning. Otho Darner: 10 mL, Refills: 11    LORazepam (ATIVAN) 1 MG tablet Take 1 tablet (1 mg total) by mouth every 4 (four) hours as needed for anxiety. Qty: 30 tablet, Refills: 3    omeprazole (PRILOSEC) 20 MG capsule Take 2 capsules (40 mg total)  by mouth daily. Qty: 30 capsule, Refills: 11    oxyCODONE 10 MG TABS Take 1 tablet (10 mg total) by mouth every 4 (four) hours as needed. Qty: 60 tablet, Refills: 0      CONTINUE these medications which have NOT CHANGED   Details  aspirin 81 MG tablet Take 81 mg by mouth daily.      !! carvedilol (COREG) 12.5 MG  tablet Take 1 tablet (12.5 mg total) by mouth 2 (two) times daily with a meal. Qty: 60 tablet, Refills: 11    cyclobenzaprine (FLEXERIL) 5 MG tablet Take 10 mg by mouth 3 (three) times daily as needed. For pain     DULoxetine (CYMBALTA) 30 MG capsule Take 1 capsule (30 mg total) by mouth daily. Qty: 30 capsule, Refills: 2   Associated Diagnoses: Major depressive disorder, recurrent episode, unspecified    Insulin Pen Needle (ADVOCATE INSULIN PEN NEEDLES) 31G X 5 MM MISC 1 pen by Does not apply route 4 (four) times daily -  before meals and at bedtime. Qty: 90 each, Refills: 2   Comments: May substitute as needed to fit paient's pens Associated Diagnoses: Type II or unspecified type diabetes mellitus with unspecified complication, uncontrolled    Insulin Syringe-Needle U-100 (B-D INS SYR ULTRAFINE .3CC/29G) 29G X 1/2" 0.3 ML MISC To be used for prn IM phenergan injections Qty: 100 each, Refills: 11    lisinopril (ZESTRIL) 2.5 MG tablet Take 1 tablet (2.5 mg total) by mouth daily. Qty: 30 tablet, Refills: 11    nicotine (NICODERM CQ - DOSED IN MG/24 HR) 7 mg/24hr patch APPLY 1 PATCH DAILY AS NEEDED *REMOVE OLD PATCH* Qty: 30 patch, Refills: 2   Comments: RX SIG: APPLY 1 PATCH DAILY AS NEEDED *REMOVE OLD PATCH*    polyethylene glycol powder (MIRALAX) powder Take 17 g by mouth daily. Mix packet with water once daily as needed for constipation. Qty: 255 g, Refills: 11    promethazine (PHENERGAN) 25 MG tablet Take 1 tablet (25 mg total) by mouth every 4 (four) hours. At first sign of nausea.  Qty: 120 tablet, Refills: 11    promethazine (PHENERGAN) 25 MG/ML injection Inject 1 mL (25 mg total) into the muscle every 6 (six) hours as needed. Qty: 10 mL, Refills: 11    sucralfate (CARAFATE) 1 G tablet Take 1 tablet (1 g total) by mouth 4 (four) times daily -  with meals and at bedtime. Qty: 120 tablet, Refills: 11    EPINEPHrine (EPIPEN) 0.3 MG/0.3ML DEVI One dose Im as needed for severe  anaphylaxis.      !! - Potential duplicate medications found. Please discuss with provider.    STOP taking these medications     ondansetron (ZOFRAN ODT) 8 MG disintegrating tablet Comments:  Reason for Stopping:       docusate sodium (COLACE) 100 MG capsule Comments:  Reason for Stopping:       insulin aspart (NOVOLOG) 100 UNIT/ML injection Comments:  Reason for Stopping:       lidocaine (LIDODERM) 5 % Comments:  Reason for Stopping:         Activity: activity as tolerated Diet: regular diet Wound Care: none needed  Follow-up with Dr. Charlett Blake on 12/16/11 at 9:45.   Follow-up Items: 1. Marijuana use 2. Nausea/Vomiting. Consider oxycodone instead of percocet as pt reports better tolerability.  3. L mid back muscle spasm. 4. Thickening of the distal esophagus in the setting of persistent reflux. Consider endoscopy  Signed: Tempie Gibeault, MD Family  Medicine Resident PGY-1 12/09/2011 8:39 AM

## 2011-12-09 NOTE — Telephone Encounter (Signed)
Done

## 2011-12-09 NOTE — Progress Notes (Signed)
Port-a-cath de-accessed by IV team, site unremarkable.  Pt to be discharged with port-a-cath per MD order.  Prescriptions, discharge instructions, and follow-up appointment given.  All belongings sent with pt and all questions answered.  Pt refused to be transported in wheelchair and traveled home by private vehicle.

## 2011-12-10 NOTE — Telephone Encounter (Signed)
PA form faxed to insurance yesterday.

## 2011-12-10 NOTE — Discharge Summary (Signed)
I discussed with Dr Marily Memos.  I agree with their plans documented in their  Note for today.

## 2011-12-13 ENCOUNTER — Other Ambulatory Visit: Payer: Self-pay | Admitting: Family Medicine

## 2011-12-13 MED ORDER — CYCLOBENZAPRINE HCL 5 MG PO TABS: 5.0000 mg | ORAL_TABLET | Freq: Three times a day (TID) | ORAL | Status: DC | PRN

## 2011-12-16 ENCOUNTER — Encounter: Payer: Self-pay | Admitting: Family Medicine

## 2011-12-16 ENCOUNTER — Ambulatory Visit (INDEPENDENT_AMBULATORY_CARE_PROVIDER_SITE_OTHER): Payer: Medicare Other | Admitting: Family Medicine

## 2011-12-16 ENCOUNTER — Other Ambulatory Visit: Payer: Self-pay | Admitting: Family Medicine

## 2011-12-16 DIAGNOSIS — R1115 Cyclical vomiting syndrome unrelated to migraine: Secondary | ICD-10-CM

## 2011-12-16 DIAGNOSIS — F1290 Cannabis use, unspecified, uncomplicated: Secondary | ICD-10-CM

## 2011-12-16 DIAGNOSIS — F1210 Cannabis abuse, uncomplicated: Secondary | ICD-10-CM

## 2011-12-16 MED ORDER — OXYCODONE HCL 10 MG PO TABS: 10.0000 mg | ORAL_TABLET | ORAL | Status: DC | PRN

## 2011-12-16 NOTE — Patient Instructions (Signed)
It was nice to see you today Please try to avoid smoking marijuana as this probably triggers your nausea. Please go to [place] and fill out a release information Please call us when you have done that and we will try to set up the appointment with [facility] I will see you in February. I have given you enough oxycodone for January and February. Your Ativan already had 3 refills.

## 2011-12-16 NOTE — Assessment & Plan Note (Signed)
Not currently vomiting. Encouraged her to not smoke marijuana. Refilled Ativan and oxycodone x2 months.

## 2011-12-16 NOTE — Progress Notes (Signed)
Subjective:     Patient ID: [name] [name], female   DOB: 11-28-1966, 45 y.o.   MRN: [id number]  HPI Patient here for hospital followup. She's been doing well and has not had any vomiting. She reports a URI for the last several days. So far, her family stress is under control. She has plans for the holidays to do her family. She does complain of some mild pain in the epigastric area. She has not tried anything to relieve this.  Review of Systems Denies CP, SOB, HA, N/V/D, fever      Objective:   Physical Exam Vital signs reviewed General appearance - alert, well appearing, and in no distress and oriented to person, place, and time Heart - normal rate, regular rhythm, normal S1, S2, no murmurs, rubs, clicks or gallops Chest - clear to auscultation, no wheezes, rales or rhonchi, symmetric air entry, no tachypnea, retractions or cyanosis Abdomen-mild tenderness in the epigastric area reproducible on palpation. Nondistended    Assessment:     Cyclic vomiting, in remission Epigastric pain, on omeprazole    Plan:     Plan to send to GI.

## 2011-12-16 NOTE — Assessment & Plan Note (Signed)
Not currently smoking. Reiterated links between marijuana and cyclic vomiting. She agrees to try to quit that many year.

## 2012-01-03 ENCOUNTER — Emergency Department (HOSPITAL_COMMUNITY)
Admission: EM | Admit: 2012-01-03 | Discharge: 2012-01-03 | Disposition: A | Discharge status: Home / Self Care | Payer: Medicare Other | Attending: Emergency Medicine | Admitting: Emergency Medicine

## 2012-01-03 ENCOUNTER — Encounter (HOSPITAL_COMMUNITY): Payer: Self-pay | Admitting: Emergency Medicine

## 2012-01-03 DIAGNOSIS — E119 Type 2 diabetes mellitus without complications: Secondary | ICD-10-CM | POA: Diagnosis not present

## 2012-01-03 DIAGNOSIS — R10817 Generalized abdominal tenderness: Secondary | ICD-10-CM | POA: Diagnosis not present

## 2012-01-03 DIAGNOSIS — F17200 Nicotine dependence, unspecified, uncomplicated: Secondary | ICD-10-CM | POA: Diagnosis not present

## 2012-01-03 DIAGNOSIS — Z7982 Long term (current) use of aspirin: Secondary | ICD-10-CM | POA: Insufficient documentation

## 2012-01-03 DIAGNOSIS — R1115 Cyclical vomiting syndrome unrelated to migraine: Secondary | ICD-10-CM | POA: Diagnosis not present

## 2012-01-03 DIAGNOSIS — I10 Essential (primary) hypertension: Secondary | ICD-10-CM | POA: Diagnosis not present

## 2012-01-03 DIAGNOSIS — Z794 Long term (current) use of insulin: Secondary | ICD-10-CM | POA: Diagnosis not present

## 2012-01-03 DIAGNOSIS — Z79899 Other long term (current) drug therapy: Secondary | ICD-10-CM | POA: Diagnosis not present

## 2012-01-03 LAB — COMPREHENSIVE METABOLIC PANEL
ALT: 8 U/L (ref 0–35)
AST: 13 U/L (ref 0–37)
Alkaline Phosphatase: 105 U/L (ref 39–117)
CO2: 25 mEq/L (ref 19–32)
GFR calc Af Amer: 44 mL/min — ABNORMAL LOW (ref >90)
GFR calc non Af Amer: 38 mL/min — ABNORMAL LOW (ref >90)
Glucose, Bld: 278 mg/dL — ABNORMAL HIGH (ref 70–99)
Potassium: 3.7 mEq/L (ref 3.5–5.1)
Sodium: 136 mEq/L (ref 135–145)
Total Protein: 8.1 g/dL (ref 6.0–8.3)

## 2012-01-03 MED ORDER — GI COCKTAIL ~~LOC~~
30.0000 mL | Freq: Once | ORAL | Status: AC
  Filled 2012-01-03: qty 30

## 2012-01-03 MED ORDER — LORAZEPAM 2 MG/ML IJ SOLN
1.0000 mg | Freq: Once | INTRAMUSCULAR | Status: AC
  Administered 2012-01-03: 1 mg via INTRAVENOUS
  Filled 2012-01-03: qty 1

## 2012-01-03 MED ORDER — PROMETHAZINE HCL 25 MG/ML IJ SOLN
25.0000 mg | Freq: Once | INTRAMUSCULAR | Status: AC
  Administered 2012-01-03: 25 mg via INTRAVENOUS
  Filled 2012-01-03: qty 1

## 2012-01-03 MED ORDER — HYDROMORPHONE HCL PF 1 MG/ML IJ SOLN
1.0000 mg | Freq: Once | INTRAMUSCULAR | Status: AC
  Administered 2012-01-03: 1 mg via INTRAVENOUS
  Filled 2012-01-03: qty 1

## 2012-01-03 MED ORDER — SODIUM CHLORIDE 0.9 % IV BOLUS (SEPSIS)
1000.0000 mL | Freq: Once | INTRAVENOUS | Status: AC
  Administered 2012-01-03: 1000 mL via INTRAVENOUS

## 2012-01-03 NOTE — ED Notes (Signed)
IV team at bedside 

## 2012-01-03 NOTE — ED Notes (Signed)
Pt unable to get comfortable. IV team on way to access port.

## 2012-01-03 NOTE — ED Notes (Signed)
Pt reports she started coughing last night and in turn it made her start vomiting. Pt reports generalized abd discomfort, reports mult episodes of vomiting. Hx of cyclic vomiting disease.

## 2012-01-03 NOTE — ED Notes (Signed)
Pt wakened from sleep, aware of pending discharge. Given phone to call for ride.

## 2012-01-03 NOTE — ED Notes (Signed)
Pt sts she has cyclic vomiting syndrome and is here for vomiting starting last night that improved but then started again this morning; pt denies diarrhea

## 2012-01-03 NOTE — ED Provider Notes (Signed)
History     CSN: [id number]  Arrival date & time 01/03/12  [id number]   First MD Initiated Contact with Patient 01/03/12 [phone]      Chief Complaint  Patient presents with  . Emesis    (Consider location/radiation/quality/duration/timing/severity/associated sxs/prior treatment) HPI The patient has a history of pancreatitis, cyclic vomiting syndrome. Now presents with intractable vomiting. She notes that she was in her usual state of health until a few days ago when she developed mild URI like symptoms. She notes that yesterday following a prolonged coughing spell she developed intractable vomiting. That episode lasted several hours, subsided with home medications. Today just prior to arrival, the patient had a similar coughing spell followed by vomiting. The vomiting has not improved with home medications. She notes mild associated abdominal pain, no chest pain, no dyspnea, no lightheadedness. She denies diarrhea, notes that she has been having normal bowel movements, and denies any dysuria as well. She also complains of chills, no fevers. Past Medical History  Diagnosis Date  . PANCREATITIS 05/09/2008  . NEUROPATHY, DIABETIC 02/23/2007  . Cyclic vomiting syndrome   . Hypertension   . Diabetes mellitus     Past Surgical History  Procedure Date  . Port-a-cath placement   . Cholecystectomy   . Abdominal hysterectomy   . Laparotomy and lysis of adhesions     Family History  Problem Relation Age of Onset  . Diabetes type II Mother   . Diabetes type II Sister     History  Substance Use Topics  . Smoking status: Current Some Day Smoker -- 0.5 packs/day for 22 years  . Smokeless tobacco: Never Used   Comment: 5 per day   . Alcohol Use: No    OB History    Grav Para Term Preterm Abortions TAB SAB Ect Mult Living                  Review of Systems  Constitutional:       HPI  HENT:       HPI otherwise negative  Eyes: Negative.   Respiratory:       HPI, otherwise negative    Cardiovascular:       HPI, otherwise nmegative  Gastrointestinal: Positive for nausea and vomiting. Negative for diarrhea.  Genitourinary:       HPI, otherwise negative  Musculoskeletal:       HPI, otherwise negative  Skin: Negative.   Neurological: Negative for syncope.    Allergies  Erythromycin; Acetaminophen; and Oxycodone-acetaminophen  Home Medications   Current Outpatient Rx  Name Route Sig Dispense Refill  . ASPIRIN 81 MG PO TABS Oral Take 81 mg by mouth daily.      Marland Kitchen CARVEDILOL 12.5 MG PO TABS Oral Take 1 tablet (12.5 mg total) by mouth 2 (two) times daily with a meal. 30 tablet 0  . CYCLOBENZAPRINE HCL 5 MG PO TABS Oral Take 1 tablet (5 mg total) by mouth 3 (three) times daily as needed for muscle spasms. For pain 30 tablet 0  . INSULIN GLARGINE 100 UNIT/ML [place] SOLN Subcutaneous Inject 10 Units into the skin every morning. . 10 mL 11  . LIDODERM 5 % EX PTCH Transdermal Place 1 patch onto the skin daily as needed. For pain    . LISINOPRIL 2.5 MG PO TABS Oral Take 1 tablet (2.5 mg total) by mouth daily. 30 tablet 11  . LORAZEPAM 1 MG PO TABS Oral Take 1 mg by mouth every 4 (four) hours as needed.  For anxiety     . NICOTINE 7 MG/24HR TD PT24  APPLY 1 PATCH DAILY AS NEEDED *REMOVE OLD PATCH* 30 patch 2    RX SIG: APPLY 1 PATCH DAILY AS NEEDED *REMOVE OLD  ...  . OMEPRAZOLE 20 MG PO CPDR Oral Take 2 capsules (40 mg total) by mouth daily. 30 capsule 11  . PHENOL 1.4 % MT LIQD Mouth/Throat Use as directed 1 spray in the mouth or throat as needed. 1 Bottle 3  . POLYETHYLENE GLYCOL 3350 PO POWD Oral Take 17 g by mouth daily. Mix packet with water once daily as needed for constipation. 255 g 11  . PROMETHAZINE HCL 25 MG PO TABS Oral Take 1 tablet (25 mg total) by mouth every 4 (four) hours. At first sign of nausea.  120 tablet 11  . PROMETHAZINE HCL 25 MG/ML [place] SOLN Intramuscular Inject 1 mL (25 mg total) into the muscle every 6 (six) hours as needed. 10 mL 11  . SENNA 8.6 MG PO  TABS Oral Take 2 tablets (17.2 mg total) by mouth 2 (two) times daily. For constipation 60 each 3  . SUCRALFATE 1 G PO TABS Oral Take 1 tablet (1 g total) by mouth 4 (four) times daily -  with meals and at bedtime. 120 tablet 11  . EPINEPHRINE 0.3 MG/0.3ML IJ DEVI  One dose Im as needed for severe anaphylaxis.     Marland Kitchen LORAZEPAM 1 MG PO TABS Oral Take 1 tablet (1 mg total) by mouth every 4 (four) hours as needed for anxiety. 30 tablet 3    BP 171/101  Pulse 110  Temp(Src) 98.1 F (36.7 C) (Oral)  Resp 15  SpO2 97%  Physical Exam  Nursing note and vitals reviewed. Constitutional: She is oriented to person, place, and time. She appears well-developed and well-nourished. No distress.  HENT:  Head: Normocephalic and atraumatic.  Eyes: Conjunctivae and EOM are normal.  Cardiovascular: Normal rate and regular rhythm.   Pulmonary/Chest: Effort normal and breath sounds normal. No stridor. No respiratory distress.       Port in place  Abdominal: Soft. Normal appearance. She exhibits no distension. There is generalized tenderness.  Musculoskeletal: She exhibits no edema.  Neurological: She is alert and oriented to person, place, and time. No cranial nerve deficit.  Skin: Skin is warm and dry.  Psychiatric: She has a normal mood and affect.   Female with cyclic vomiting, prior pancreatitis now presents with ongoing emesis turned URI like episode. Rule out pancreatitis, electrolyte abnormalities. ED Course  Procedures (including critical care time)   Labs Reviewed  COMPREHENSIVE METABOLIC PANEL  LIPASE, BLOOD   No results found.   No diagnosis found.   10:54 AM Patient sleeping, seemingly comfortably.  When awake she will be D/C home. MDM  This 46 year old female presents with concerns over intractable vomiting. The patient has a history of cyclic vomiting syndrome, notes that this episode is very similar to multiple prior episodes. On exam the patient is in no distress, and is capable  of speaking clearly, interacting appropriately when engaged in direct conversation. When not distracted the patient seems to be uncomfortable, vomiting. Following provision of ED interventions the patient was sleeping, seemingly comfortably. Given this improvement in her condition, the unremarkable laboratory evaluation (similar to prior results), the patient is appropriate for discharged with continued evaluation and management by her primary care physician        Carmin Muskrat, MD 01/03/12 1056

## 2012-01-03 NOTE — ED Notes (Signed)
IV team called to d/c port

## 2012-01-06 ENCOUNTER — Encounter (HOSPITAL_COMMUNITY): Payer: Self-pay

## 2012-01-06 ENCOUNTER — Emergency Department (HOSPITAL_COMMUNITY)
Admission: EM | Admit: 2012-01-06 | Discharge: 2012-01-07 | Disposition: A | Discharge status: Home / Self Care | Payer: Medicare Other | Attending: Emergency Medicine | Admitting: Emergency Medicine

## 2012-01-06 DIAGNOSIS — R112 Nausea with vomiting, unspecified: Secondary | ICD-10-CM | POA: Insufficient documentation

## 2012-01-06 DIAGNOSIS — K2900 Acute gastritis without bleeding: Secondary | ICD-10-CM | POA: Diagnosis not present

## 2012-01-06 DIAGNOSIS — Z79899 Other long term (current) drug therapy: Secondary | ICD-10-CM | POA: Insufficient documentation

## 2012-01-06 DIAGNOSIS — K2970 Gastritis, unspecified, without bleeding: Secondary | ICD-10-CM | POA: Insufficient documentation

## 2012-01-06 DIAGNOSIS — K859 Acute pancreatitis without necrosis or infection, unspecified: Secondary | ICD-10-CM | POA: Diagnosis not present

## 2012-01-06 DIAGNOSIS — R05 Cough: Secondary | ICD-10-CM | POA: Insufficient documentation

## 2012-01-06 DIAGNOSIS — E119 Type 2 diabetes mellitus without complications: Secondary | ICD-10-CM | POA: Diagnosis not present

## 2012-01-06 DIAGNOSIS — Z794 Long term (current) use of insulin: Secondary | ICD-10-CM | POA: Insufficient documentation

## 2012-01-06 DIAGNOSIS — R1013 Epigastric pain: Secondary | ICD-10-CM | POA: Diagnosis not present

## 2012-01-06 DIAGNOSIS — K2990 Gastroduodenitis, unspecified, without bleeding: Secondary | ICD-10-CM | POA: Insufficient documentation

## 2012-01-06 DIAGNOSIS — I10 Essential (primary) hypertension: Secondary | ICD-10-CM | POA: Insufficient documentation

## 2012-01-06 DIAGNOSIS — R059 Cough, unspecified: Secondary | ICD-10-CM | POA: Insufficient documentation

## 2012-01-06 DIAGNOSIS — Z7982 Long term (current) use of aspirin: Secondary | ICD-10-CM | POA: Diagnosis not present

## 2012-01-06 DIAGNOSIS — R739 Hyperglycemia, unspecified: Secondary | ICD-10-CM

## 2012-01-06 LAB — CBC
HCT: 36.4 % (ref 36.0–46.0)
MCH: 31.2 pg (ref 26.0–34.0)
MCV: 87.3 fL (ref 78.0–100.0)
Platelets: 206 10*3/uL (ref 150–400)
RBC: 4.17 MIL/uL (ref 3.87–5.11)
WBC: 7.2 10*3/uL (ref 4.0–10.5)

## 2012-01-06 LAB — DIFFERENTIAL
Eosinophils Absolute: 0.1 10*3/uL (ref 0.0–0.7)
Eosinophils Relative: 1 % (ref 0–5)
Lymphocytes Relative: 42 % (ref 12–46)
Lymphs Abs: 3.1 10*3/uL (ref 0.7–4.0)
Monocytes Absolute: 0.4 10*3/uL (ref 0.1–1.0)

## 2012-01-06 LAB — COMPREHENSIVE METABOLIC PANEL
BUN: 20 mg/dL (ref 6–23)
CO2: 26 mEq/L (ref 19–32)
Calcium: 10.6 mg/dL — ABNORMAL HIGH (ref 8.4–10.5)
Creatinine, Ser: 1.7 mg/dL — ABNORMAL HIGH (ref 0.50–1.10)
GFR calc Af Amer: 41 mL/min — ABNORMAL LOW (ref >90)
GFR calc non Af Amer: 35 mL/min — ABNORMAL LOW (ref >90)
Glucose, Bld: 414 mg/dL — ABNORMAL HIGH (ref 70–99)
Sodium: 134 mEq/L — ABNORMAL LOW (ref 135–145)
Total Protein: 8.3 g/dL (ref 6.0–8.3)

## 2012-01-06 LAB — URINALYSIS, ROUTINE W REFLEX MICROSCOPIC
Nitrite: NEGATIVE
Protein, ur: 100 mg/dL — AB
Specific Gravity, Urine: 1.024 (ref 1.005–1.030)
Urobilinogen, UA: 0.2 mg/dL (ref 0.0–1.0)

## 2012-01-06 LAB — GLUCOSE, CAPILLARY: Glucose-Capillary: 174 mg/dL — ABNORMAL HIGH (ref 70–99)

## 2012-01-06 LAB — URINE MICROSCOPIC-ADD ON

## 2012-01-06 LAB — LIPASE, BLOOD: Lipase: 131 U/L — ABNORMAL HIGH (ref 11–59)

## 2012-01-06 MED ORDER — HYDROMORPHONE HCL PF 1 MG/ML IJ SOLN
1.0000 mg | Freq: Once | INTRAMUSCULAR | Status: AC
  Administered 2012-01-06: 1 mg via INTRAVENOUS

## 2012-01-06 MED ORDER — HYDROMORPHONE HCL PF 1 MG/ML IJ SOLN
1.0000 mg | Freq: Once | INTRAMUSCULAR | Status: AC
  Administered 2012-01-06: 1 mg via INTRAVENOUS
  Filled 2012-01-06: qty 1

## 2012-01-06 MED ORDER — ONDANSETRON 4 MG PO TBDP
8.0000 mg | ORAL_TABLET | Freq: Once | ORAL | Status: AC
  Administered 2012-01-06: 8 mg via ORAL
  Filled 2012-01-06: qty 2

## 2012-01-06 MED ORDER — OXYCODONE HCL 10 MG PO TABS: 10.0000 mg | ORAL_TABLET | Freq: Four times a day (QID) | ORAL | Status: DC | PRN

## 2012-01-06 MED ORDER — METOCLOPRAMIDE HCL 5 MG/ML IJ SOLN
10.0000 mg | Freq: Once | INTRAMUSCULAR | Status: AC
  Administered 2012-01-06: 10 mg via INTRAVENOUS
  Filled 2012-01-06: qty 2

## 2012-01-06 MED ORDER — INSULIN REGULAR HUMAN 100 UNIT/ML IJ SOLN
5.0000 [IU] | Freq: Once | INTRAMUSCULAR | Status: DC
  Filled 2012-01-06: qty 0.05

## 2012-01-06 MED ORDER — SODIUM CHLORIDE 0.9 % IV SOLN: INTRAVENOUS | Status: DC

## 2012-01-06 MED ORDER — INSULIN REGULAR HUMAN 100 UNIT/ML IJ SOLN
2.5000 [IU] | Freq: Once | INTRAMUSCULAR | Status: DC
  Filled 2012-01-06: qty 0.03

## 2012-01-06 MED ORDER — HYDROMORPHONE HCL PF 1 MG/ML IJ SOLN: 1.0000 mg | Freq: Once | INTRAMUSCULAR | Status: DC

## 2012-01-06 MED ORDER — INSULIN ASPART 100 UNIT/ML ~~LOC~~ SOLN
SUBCUTANEOUS | Status: AC
  Administered 2012-01-06: 2.5 [IU]
  Filled 2012-01-06: qty 1

## 2012-01-06 MED ORDER — FAMOTIDINE IN NACL 20-0.9 MG/50ML-% IV SOLN
20.0000 mg | Freq: Once | INTRAVENOUS | Status: AC
  Administered 2012-01-06: 20 mg via INTRAVENOUS
  Filled 2012-01-06: qty 50

## 2012-01-06 MED ORDER — METOCLOPRAMIDE HCL 5 MG/ML IJ SOLN: 10.0000 mg | Freq: Once | INTRAMUSCULAR | Status: DC

## 2012-01-06 MED ORDER — SODIUM CHLORIDE 0.9 % IV BOLUS (SEPSIS)
1000.0000 mL | Freq: Once | INTRAVENOUS | Status: AC
  Administered 2012-01-06: 1000 mL via INTRAVENOUS

## 2012-01-06 MED ORDER — GI COCKTAIL ~~LOC~~
30.0000 mL | Freq: Once | ORAL | Status: AC
  Administered 2012-01-06: 30 mL via ORAL
  Filled 2012-01-06: qty 30

## 2012-01-06 MED ORDER — LORAZEPAM 1 MG PO TABS: 1.0000 mg | ORAL_TABLET | Freq: Three times a day (TID) | ORAL | Status: DC | PRN

## 2012-01-06 MED ORDER — HYDROMORPHONE HCL PF 1 MG/ML IJ SOLN
1.0000 mg | Freq: Once | INTRAMUSCULAR | Status: DC
  Filled 2012-01-06: qty 1

## 2012-01-06 MED ORDER — SODIUM CHLORIDE 0.9 % IV BOLUS (SEPSIS): 1000.0000 mL | Freq: Once | INTRAVENOUS | Status: DC

## 2012-01-06 MED ORDER — PROMETHAZINE HCL 25 MG/ML IJ SOLN
12.5000 mg | Freq: Once | INTRAMUSCULAR | Status: AC
  Administered 2012-01-06: 12.5 mg via INTRAVENOUS
  Filled 2012-01-06: qty 1

## 2012-01-06 NOTE — ED Provider Notes (Signed)
History     CSN: [id number]  Arrival date & time 01/06/12  1455   First MD Initiated Contact with Patient 01/06/12 1732      HPI Patient reports she had upper respiratory tract infection which has aggravated her cyclic vomiting syndrome. Reports she was seen here 3 days ago for same. States symptoms have worsened since she has returned. Continues to deny fever. Reports she's unable to keep down medication at home.  Patient is a 46 y.o. female presenting with abdominal pain. The history is provided by the patient.  Abdominal Pain The primary symptoms of the illness include abdominal pain, nausea and vomiting. The primary symptoms of the illness do not include fever, shortness of breath, diarrhea, dysuria or vaginal discharge. The current episode started more than 2 days ago. The onset of the illness was gradual. The problem has been gradually worsening.  The abdominal pain is located in the epigastric region. The severity of the abdominal pain is 10/10. The abdominal pain is relieved by nothing. The abdominal pain is exacerbated by eating.  The patient states that she believes she is currently not pregnant. The patient has not had a change in bowel habit. Symptoms associated with the illness do not include chills, constipation, urgency, hematuria, frequency or back pain. Associated medical issues comments: Cyclic vomiting syndrome and chronic abdominal pain..    Past Medical History  Diagnosis Date  . PANCREATITIS 05/09/2008  . NEUROPATHY, DIABETIC 02/23/2007  . Cyclic vomiting syndrome   . Hypertension   . Diabetes mellitus     Past Surgical History  Procedure Date  . Port-a-cath placement   . Cholecystectomy   . Abdominal hysterectomy   . Laparotomy and lysis of adhesions     Family History  Problem Relation Age of Onset  . Diabetes type II Mother   . Diabetes type II Sister     History  Substance Use Topics  . Smoking status: Current Some Day Smoker -- 0.5 packs/day for 22  years  . Smokeless tobacco: Never Used   Comment: 5 per day   . Alcohol Use: No    OB History    Grav Para Term Preterm Abortions TAB SAB Ect Mult Living                  Review of Systems  Constitutional: Negative for fever and chills.  Respiratory: Positive for cough. Negative for shortness of breath.   Cardiovascular: Negative for chest pain.  Gastrointestinal: Positive for nausea, vomiting and abdominal pain. Negative for diarrhea and constipation.  Genitourinary: Negative for dysuria, urgency, frequency, hematuria, flank pain, vaginal discharge and vaginal pain.  Musculoskeletal: Negative for back pain.  All other systems reviewed and are negative.    Allergies  Erythromycin; Acetaminophen; and Oxycodone-acetaminophen  Home Medications   Current Outpatient Rx  Name Route Sig Dispense Refill  . ASPIRIN 81 MG PO TABS Oral Take 81 mg by mouth daily.      Marland Kitchen CARVEDILOL 12.5 MG PO TABS Oral Take 1 tablet (12.5 mg total) by mouth 2 (two) times daily with a meal. 30 tablet 0  . CYCLOBENZAPRINE HCL 5 MG PO TABS Oral Take 5 mg by mouth 3 (three) times daily as needed. For spasms    . EPINEPHRINE 0.3 MG/0.3ML IJ DEVI  One dose Im as needed for severe anaphylaxis.    . INSULIN ASPART 100 UNIT/ML [place] SOLN Subcutaneous Inject 6 Units into the skin 3 (three) times daily before meals.    Marland Kitchen  INSULIN GLARGINE 100 UNIT/ML [place] SOLN Subcutaneous Inject 10 Units into the skin every morning. . 10 mL 11  . LIDODERM 5 % EX PTCH Transdermal Place 1 patch onto the skin daily as needed. For pain    . LISINOPRIL 20 MG PO TABS Oral Take 20 mg by mouth daily.    Marland Kitchen LORAZEPAM 1 MG PO TABS Oral Take 1 mg by mouth every 4 (four) hours as needed. For anxiety     . NICOTINE 7 MG/24HR TD PT24 Transdermal Place 1 patch onto the skin daily.    Marland Kitchen OMEPRAZOLE 20 MG PO CPDR Oral Take 2 capsules (40 mg total) by mouth daily. 30 capsule 11  . OXYCODONE HCL ER 10 MG PO TB12 Oral Take 10 mg by mouth every 4 (four) hours  as needed. For pain    . PROMETHAZINE HCL 25 MG PO TABS Oral Take 25 mg by mouth every 4 (four) hours as needed. For nausea    . PROMETHAZINE HCL 25 MG/ML IJ SOLN Intramuscular Inject 1 mL (25 mg total) into the muscle every 6 (six) hours as needed. 10 mL 11  . SENNA 8.6 MG PO TABS Oral Take 2 tablets (17.2 mg total) by mouth 2 (two) times daily. For constipation 60 each 3  . SUCRALFATE 1 G PO TABS Oral Take 1 tablet (1 g total) by mouth 4 (four) times daily -  with meals and at bedtime. 120 tablet 11  . LORAZEPAM 1 MG PO TABS Oral Take 1 tablet (1 mg total) by mouth every 4 (four) hours as needed for anxiety. 30 tablet 3    BP 190/89  Pulse 103  Temp(Src) 98.8 F (37.1 C) (Oral)  Resp 18  SpO2 100%  Physical Exam  Vitals reviewed. Constitutional: She is oriented to person, place, and time. Vital signs are normal. She appears well-developed and well-nourished.  HENT:  Head: Normocephalic and atraumatic.  Eyes: Conjunctivae are normal. Pupils are equal, round, and reactive to light.  Neck: Normal range of motion. Neck supple.  Cardiovascular: Normal rate, regular rhythm and normal heart sounds.  Exam reveals no friction rub.   No murmur heard. Pulmonary/Chest: Effort normal and breath sounds normal. She has no wheezes. She has no rhonchi. She has no rales. She exhibits no tenderness.  Abdominal: Soft. Bowel sounds are normal. She exhibits no distension and no mass. There is tenderness (diffuse). There is no rebound and no guarding.  Musculoskeletal: Normal range of motion.  Neurological: She is alert and oriented to person, place, and time. Coordination normal.  Skin: Skin is warm and dry. No rash noted. No erythema. No pallor.    ED Course  Procedures  Results for orders placed during the hospital encounter of 01/06/12  GLUCOSE, CAPILLARY      Component Value Range   Glucose-Capillary 421 (*) 70 - 99 (mg/dL)  CBC      Component Value Range   WBC 7.2  4.0 - 10.5 (K/uL)   RBC  4.17  3.87 - 5.11 (MIL/uL)   Hemoglobin 13.0  12.0 - 15.0 (g/dL)   HCT 36.4  36.0 - 46.0 (%)   MCV 87.3  78.0 - 100.0 (fL)   MCH 31.2  26.0 - 34.0 (pg)   MCHC 35.7  30.0 - 36.0 (g/dL)   RDW 12.5  11.5 - 15.5 (%)   Platelets 206  150 - 400 (K/uL)  DIFFERENTIAL      Component Value Range   Neutrophils Relative 50  43 -  77 (%)   Neutro Abs 3.6  1.7 - 7.7 (K/uL)   Lymphocytes Relative 42  12 - 46 (%)   Lymphs Abs 3.1  0.7 - 4.0 (K/uL)   Monocytes Relative 6  3 - 12 (%)   Monocytes Absolute 0.4  0.1 - 1.0 (K/uL)   Eosinophils Relative 1  0 - 5 (%)   Eosinophils Absolute 0.1  0.0 - 0.7 (K/uL)   Basophils Relative 1  0 - 1 (%)   Basophils Absolute 0.0  0.0 - 0.1 (K/uL)  COMPREHENSIVE METABOLIC PANEL      Component Value Range   Sodium 134 (*) 135 - 145 (mEq/L)   Potassium 4.1  3.5 - 5.1 (mEq/L)   Chloride 96  96 - 112 (mEq/L)   CO2 26  19 - 32 (mEq/L)   Glucose, Bld 414 (*) 70 - 99 (mg/dL)   BUN 20  6 - 23 (mg/dL)   Creatinine, Ser 1.70 (*) 0.50 - 1.10 (mg/dL)   Calcium 10.6 (*) 8.4 - 10.5 (mg/dL)   Total Protein 8.3  6.0 - 8.3 (g/dL)   Albumin 4.2  3.5 - 5.2 (g/dL)   AST 12  0 - 37 (U/L)   ALT 9  0 - 35 (U/L)   Alkaline Phosphatase 106  39 - 117 (U/L)   Total Bilirubin 0.3  0.3 - 1.2 (mg/dL)   GFR calc non Af Amer 35 (*) >90 (mL/min)   GFR calc Af Amer 41 (*) >90 (mL/min)  LIPASE, BLOOD      Component Value Range   Lipase 131 (*) 11 - 59 (U/L)  URINALYSIS, ROUTINE W REFLEX MICROSCOPIC      Component Value Range   Color, Urine YELLOW  YELLOW    APPearance HAZY (*) CLEAR    Specific Gravity, Urine 1.024  1.005 - 1.030    pH 6.0  5.0 - 8.0    Glucose, UA >1000 (*) NEGATIVE (mg/dL)   Hgb urine dipstick TRACE (*) NEGATIVE    Bilirubin Urine NEGATIVE  NEGATIVE    Ketones, ur NEGATIVE  NEGATIVE (mg/dL)   Protein, ur 100 (*) NEGATIVE (mg/dL)   Urobilinogen, UA 0.2  0.0 - 1.0 (mg/dL)   Nitrite NEGATIVE  NEGATIVE    Leukocytes, UA SMALL (*) NEGATIVE   URINE MICROSCOPIC-ADD ON        Component Value Range   Squamous Epithelial / LPF FEW (*) RARE    WBC, UA 3-6  <3 (WBC/hpf)   RBC / HPF 0-2  <3 (RBC/hpf)   Bacteria, UA FEW (*) RARE    Urine-Other FEW YEAST    GLUCOSE, CAPILLARY      Component Value Range   Glucose-Capillary 394 (*) 70 - 99 (mg/dL)   No results found.   MDM     We'll place patient in CDU pending symptomatic relief and improvement of hyperglycemia. Patient states that she does not want the whole 5 units of insulin since it will cause her to bottom out that she has not been anything today. Will give 2.5U and fluids .      Maricela Bo, PA-C 01/06/12 2011

## 2012-01-06 NOTE — ED Notes (Signed)
Report given to Elane Fritz, RN in CDU

## 2012-01-06 NOTE — ED Notes (Signed)
cdu states they can not take pt at this time.

## 2012-01-06 NOTE — ED Notes (Signed)
Pt states that she has been diagnosed with cyclical vomiting syndrome. She was seen here the other day but told that she wasn't sick enough in order to be admitted to the hospital. She states that she has continued to have severe abdominal pain with frequent nausea and vomiting.

## 2012-01-07 NOTE — ED Provider Notes (Signed)
Medical screening examination/treatment/procedure(s) were performed by non-physician practitioner and as supervising physician I was immediately available for consultation/collaboration.   Maudry Diego, MD 01/07/12 1452

## 2012-01-13 ENCOUNTER — Ambulatory Visit (INDEPENDENT_AMBULATORY_CARE_PROVIDER_SITE_OTHER): Payer: Medicare Other | Admitting: Family Medicine

## 2012-01-13 ENCOUNTER — Encounter (HOSPITAL_COMMUNITY): Payer: Self-pay

## 2012-01-13 ENCOUNTER — Other Ambulatory Visit: Payer: Self-pay

## 2012-01-13 ENCOUNTER — Telehealth: Payer: Self-pay | Admitting: *Deleted

## 2012-01-13 ENCOUNTER — Emergency Department (HOSPITAL_COMMUNITY)
Admission: EM | Admit: 2012-01-13 | Discharge: 2012-01-14 | Disposition: A | Discharge status: Home / Self Care | Payer: Medicare Other | Attending: Emergency Medicine | Admitting: Emergency Medicine

## 2012-01-13 ENCOUNTER — Encounter: Payer: Self-pay | Admitting: Family Medicine

## 2012-01-13 VITALS — BP 181/94 | HR 85 | Temp 97.7°F | Ht 65.0 in | Wt 170.6 lb

## 2012-01-13 DIAGNOSIS — R10817 Generalized abdominal tenderness: Secondary | ICD-10-CM | POA: Insufficient documentation

## 2012-01-13 DIAGNOSIS — R1115 Cyclical vomiting syndrome unrelated to migraine: Secondary | ICD-10-CM | POA: Diagnosis not present

## 2012-01-13 DIAGNOSIS — Z794 Long term (current) use of insulin: Secondary | ICD-10-CM | POA: Insufficient documentation

## 2012-01-13 DIAGNOSIS — I10 Essential (primary) hypertension: Secondary | ICD-10-CM | POA: Insufficient documentation

## 2012-01-13 DIAGNOSIS — Z79899 Other long term (current) drug therapy: Secondary | ICD-10-CM | POA: Insufficient documentation

## 2012-01-13 DIAGNOSIS — R109 Unspecified abdominal pain: Secondary | ICD-10-CM | POA: Diagnosis not present

## 2012-01-13 DIAGNOSIS — E119 Type 2 diabetes mellitus without complications: Secondary | ICD-10-CM | POA: Diagnosis not present

## 2012-01-13 DIAGNOSIS — Z7982 Long term (current) use of aspirin: Secondary | ICD-10-CM | POA: Diagnosis not present

## 2012-01-13 DIAGNOSIS — F17200 Nicotine dependence, unspecified, uncomplicated: Secondary | ICD-10-CM | POA: Insufficient documentation

## 2012-01-13 DIAGNOSIS — R1084 Generalized abdominal pain: Secondary | ICD-10-CM | POA: Diagnosis not present

## 2012-01-13 LAB — COMPREHENSIVE METABOLIC PANEL
ALT: 10 U/L (ref 0–35)
Albumin: 4.3 g/dL (ref 3.5–5.2)
Alkaline Phosphatase: 94 U/L (ref 39–117)
Chloride: 100 mEq/L (ref 96–112)
Glucose, Bld: 208 mg/dL — ABNORMAL HIGH (ref 70–99)
Potassium: 4.1 mEq/L (ref 3.5–5.1)
Sodium: 139 mEq/L (ref 135–145)
Total Bilirubin: 0.4 mg/dL (ref 0.3–1.2)
Total Protein: 8.3 g/dL (ref 6.0–8.3)

## 2012-01-13 LAB — URINE MICROSCOPIC-ADD ON

## 2012-01-13 LAB — CBC
HCT: 38.8 % (ref 36.0–46.0)
MCHC: 34.8 g/dL (ref 30.0–36.0)
MCV: 89.2 fL (ref 78.0–100.0)
Platelets: 264 10*3/uL (ref 150–400)

## 2012-01-13 LAB — URINALYSIS, ROUTINE W REFLEX MICROSCOPIC
Bilirubin Urine: NEGATIVE
Hgb urine dipstick: NEGATIVE
Specific Gravity, Urine: 1.017 (ref 1.005–1.030)
pH: 6.5 (ref 5.0–8.0)

## 2012-01-13 LAB — DIFFERENTIAL
Basophils Relative: 0 % (ref 0–1)
Eosinophils Absolute: 0.2 10*3/uL (ref 0.0–0.7)
Lymphs Abs: 3.9 10*3/uL (ref 0.7–4.0)
Neutro Abs: 4.2 10*3/uL (ref 1.7–7.7)
Neutrophils Relative %: 46 % (ref 43–77)

## 2012-01-13 MED ORDER — PROMETHAZINE HCL 25 MG PO TABS: 25.0000 mg | ORAL_TABLET | ORAL | Status: DC | PRN

## 2012-01-13 MED ORDER — LORAZEPAM 1 MG PO TABS: 1.0000 mg | ORAL_TABLET | Freq: Three times a day (TID) | ORAL | Status: DC | PRN

## 2012-01-13 MED ORDER — HYDROMORPHONE HCL PF 1 MG/ML IJ SOLN
1.0000 mg | Freq: Once | INTRAMUSCULAR | Status: AC
  Administered 2012-01-13: 1 mg via INTRAVENOUS
  Filled 2012-01-13: qty 1

## 2012-01-13 MED ORDER — LORAZEPAM 2 MG/ML IJ SOLN: 1.0000 mg | Freq: Once | INTRAMUSCULAR | Status: DC

## 2012-01-13 MED ORDER — HYDROMORPHONE HCL PF 1 MG/ML IJ SOLN: 1.0000 mg | Freq: Once | INTRAMUSCULAR | Status: DC

## 2012-01-13 MED ORDER — PROMETHAZINE HCL 25 MG/ML IJ SOLN: 25.0000 mg | Freq: Once | INTRAMUSCULAR | Status: DC

## 2012-01-13 MED ORDER — PROMETHAZINE HCL 25 MG/ML IJ SOLN
25.0000 mg | INTRAMUSCULAR | Status: AC
  Administered 2012-01-13: 25 mg via INTRAVENOUS
  Filled 2012-01-13: qty 1

## 2012-01-13 MED ORDER — LORAZEPAM 2 MG/ML IJ SOLN
2.0000 mg | Freq: Once | INTRAMUSCULAR | Status: AC
  Administered 2012-01-13: 2 mg via INTRAMUSCULAR

## 2012-01-13 MED ORDER — MORPHINE SULFATE 4 MG/ML IJ SOLN
2.0000 mg | Freq: Once | INTRAMUSCULAR | Status: AC
  Administered 2012-01-13: 2 mg via INTRAMUSCULAR

## 2012-01-13 MED ORDER — PROMETHAZINE HCL 25 MG/ML IJ SOLN: 25.0000 mg | Freq: Four times a day (QID) | INTRAMUSCULAR | Status: DC | PRN

## 2012-01-13 MED ORDER — LORAZEPAM 2 MG/ML IJ SOLN: 2.0000 mg | Freq: Once | INTRAMUSCULAR | Status: DC

## 2012-01-13 MED ORDER — OXYCODONE HCL 10 MG PO TABS: 10.0000 mg | ORAL_TABLET | Freq: Four times a day (QID) | ORAL | Status: DC | PRN

## 2012-01-13 MED ORDER — SYRINGE (DISPOSABLE) 2.5 ML MISC: 1.0000 | Freq: Three times a day (TID) | Status: DC | PRN

## 2012-01-13 MED ORDER — PROMETHAZINE HCL 25 MG/ML IJ SOLN
25.0000 mg | Freq: Once | INTRAMUSCULAR | Status: DC
  Administered 2012-01-13: 25 mg via INTRAMUSCULAR

## 2012-01-13 MED ORDER — PROMETHAZINE HCL 25 MG/ML IJ SOLN
25.0000 mg | Freq: Once | INTRAMUSCULAR | Status: AC
  Administered 2012-01-13: 25 mg via INTRAMUSCULAR

## 2012-01-13 MED ORDER — LORAZEPAM 2 MG/ML IJ SOLN
1.0000 mg | Freq: Once | INTRAMUSCULAR | Status: AC
  Administered 2012-01-13: 1 mg via INTRAVENOUS
  Filled 2012-01-13: qty 1

## 2012-01-13 MED ORDER — "NEEDLE (DISP) 23G X 1-1/2"" MISC": 1.0000 | Freq: Three times a day (TID) | Status: DC | PRN

## 2012-01-13 NOTE — ED Notes (Signed)
Patient is resting comfortably. 

## 2012-01-13 NOTE — ED Notes (Signed)
Pt presents with vomiting and abdominal pain that started again today.  Pt seen here for same last week, pt reports relief from symptoms, but they returned again today.  Pt reports she went to her PCP and was referred here.

## 2012-01-13 NOTE — ED Notes (Signed)
No active vomiting at this time.

## 2012-01-13 NOTE — ED Notes (Signed)
Pt c/o chest pain  While sitting in the waiting room.  Pt taken to traige and ekg requested.  Tech performing

## 2012-01-13 NOTE — ED Provider Notes (Signed)
History     CSN: [id number]  Arrival date & time 01/13/12  1716   First MD Initiated Contact with Patient 01/13/12 2002      Chief Complaint  Patient presents with  . Emesis    (Consider location/radiation/quality/duration/timing/severity/associated sxs/prior treatment) HPI Comments: Patient with a history of cyclic vomiting syndrome who has been seen here several times over the past several weeks and was seen today at [facility] for the same - she states that she was given a shot of morphine, ativan and phenergan, states she went home and fell asleep and that her symptoms then improved - she states when she awoke that her pain and vomiting returned and so she came here - she reports diffuse abdominal pain without localization, vomiting without blood, denies diarrhea, constipation, fever, chills, vaginal discharge or bleeding.  Patient is a 46 y.o. female presenting with vomiting. The history is provided by the patient. No language interpreter was used.  Emesis  This is a chronic problem. The current episode started 3 to 5 hours ago. The problem occurs 5 to 10 times per day. The problem has not changed since onset.The emesis has an appearance of stomach contents. There has been no fever. Associated symptoms include abdominal pain. Pertinent negatives include no arthralgias, no chills, no cough, no diarrhea, no fever, no headaches, no myalgias, no sweats and no URI.    Past Medical History  Diagnosis Date  . PANCREATITIS 05/09/2008  . NEUROPATHY, DIABETIC 02/23/2007  . Cyclic vomiting syndrome   . Hypertension   . Diabetes mellitus     Past Surgical History  Procedure Date  . Port-a-cath placement   . Cholecystectomy   . Abdominal hysterectomy   . Laparotomy and lysis of adhesions     Family History  Problem Relation Age of Onset  . Diabetes type II Mother   . Diabetes type II Sister     History  Substance Use Topics  . Smoking status: Current Some Day Smoker -- 0.5 packs/day for  22 years  . Smokeless tobacco: Never Used   Comment: 5 per day   . Alcohol Use: No    OB History    Grav Para Term Preterm Abortions TAB SAB Ect Mult Living                  Review of Systems  Constitutional: Negative for fever and chills.  Respiratory: Negative for cough.   Gastrointestinal: Positive for nausea, vomiting and abdominal pain. Negative for diarrhea, constipation, blood in stool and abdominal distention.  Genitourinary: Negative for dysuria, vaginal bleeding and vaginal discharge.  Musculoskeletal: Negative for myalgias and arthralgias.  Neurological: Negative for headaches.  All other systems reviewed and are negative.    Allergies  Erythromycin; Acetaminophen; and Oxycodone-acetaminophen  Home Medications   Current Outpatient Rx  Name Route Sig Dispense Refill  . ASPIRIN 81 MG PO TABS Oral Take 81 mg by mouth daily.      Marland Kitchen CARVEDILOL 12.5 MG PO TABS Oral Take 1 tablet (12.5 mg total) by mouth 2 (two) times daily with a meal. 30 tablet 0  . CYCLOBENZAPRINE HCL 5 MG PO TABS Oral Take 5 mg by mouth 3 (three) times daily as needed. For spasms    . EPINEPHRINE 0.3 MG/0.3ML IJ DEVI  One dose Im as needed for severe anaphylaxis.    . INSULIN ASPART 100 UNIT/ML [place] SOLN Subcutaneous Inject 6 Units into the skin 3 (three) times daily before meals.    . INSULIN  GLARGINE 100 UNIT/ML [place] SOLN Subcutaneous Inject 10 Units into the skin every morning. . 10 mL 11  . LIDODERM 5 % EX PTCH Transdermal Place 1 patch onto the skin daily as needed. For pain    . LISINOPRIL 20 MG PO TABS Oral Take 20 mg by mouth daily.    Marland Kitchen LORAZEPAM 1 MG PO TABS Oral Take 1 mg by mouth every 4 (four) hours as needed. For anxiety     . NEEDLE (DISP) 23G X 1-1/2" MISC Does not apply 1 Device by Does not apply route 3 (three) times daily as needed. 100 each 11  . NICOTINE 7 MG/24HR TD PT24 Transdermal Place 1 patch onto the skin daily.    Marland Kitchen OMEPRAZOLE 20 MG PO CPDR Oral Take 2 capsules (40 mg total) by  mouth daily. 30 capsule 11  . OXYCODONE HCL 10 MG PO TABS Oral Take 10 mg by mouth every 6 (six) hours as needed. For pain    . SENNA 8.6 MG PO TABS Oral Take 2 tablets (17.2 mg total) by mouth 2 (two) times daily. For constipation 60 each 3  . SUCRALFATE 1 G PO TABS Oral Take 1 tablet (1 g total) by mouth 4 (four) times daily -  with meals and at bedtime. 120 tablet 11  . SYRINGE (DISPOSABLE) 2.5 ML MISC Does not apply 1 Device by Does not apply route 3 (three) times daily as needed. 100 each 11    BP 112/93  Pulse 76  Temp(Src) 97.6 F (36.4 C) (Oral)  Resp 16  SpO2 96%  Physical Exam  Nursing note and vitals reviewed. Constitutional: She is oriented to person, place, and time. She appears well-developed and well-nourished.       tearful  HENT:  Head: Normocephalic and atraumatic.  Right Ear: External ear normal.  Left Ear: External ear normal.  Nose: Nose normal.  Mouth/Throat: Oropharynx is clear and moist. No oropharyngeal exudate.  Eyes: Conjunctivae are normal. Pupils are equal, round, and reactive to light. No scleral icterus.  Neck: Normal range of motion. Neck supple.  Cardiovascular: Normal rate, regular rhythm and normal heart sounds.  Exam reveals no gallop and no friction rub.   No murmur heard. Pulmonary/Chest: Effort normal and breath sounds normal. No respiratory distress. She exhibits no tenderness.  Abdominal: Soft. She exhibits no distension and no mass. There is generalized tenderness. There is no rebound and no guarding.       Diffuse ttp  Musculoskeletal: Normal range of motion.  Lymphadenopathy:    She has no cervical adenopathy.  Neurological: She is alert and oriented to person, place, and time. No cranial nerve deficit.  Skin: Skin is warm and dry. No rash noted. No erythema. No pallor.  Psychiatric: She has a normal mood and affect. Her behavior is normal. Judgment and thought content normal.    ED Course  Procedures (including critical care  time)  Labs Reviewed  COMPREHENSIVE METABOLIC PANEL - Abnormal; Notable for the following:    Glucose, Bld 208 (*)    Creatinine, Ser 1.68 (*)    Calcium 10.8 (*)    GFR calc non Af Amer 36 (*)    GFR calc Af Amer 41 (*)    All other components within normal limits  LIPASE, BLOOD - Abnormal; Notable for the following:    Lipase 113 (*)    All other components within normal limits  URINALYSIS, ROUTINE W REFLEX MICROSCOPIC - Abnormal; Notable for the following:  APPearance CLOUDY (*)    Glucose, UA 100 (*)    Ketones, ur 15 (*)    Protein, ur 100 (*)    Leukocytes, UA TRACE (*)    All other components within normal limits  URINE MICROSCOPIC-ADD ON - Abnormal; Notable for the following:    Squamous Epithelial / LPF FEW (*)    Casts HYALINE CASTS (*)    All other components within normal limits  CBC  DIFFERENTIAL  PREGNANCY, URINE   No results found. Results for orders placed during the hospital encounter of 01/13/12  CBC      Component Value Range   WBC 9.1  4.0 - 10.5 (K/uL)   RBC 4.35  3.87 - 5.11 (MIL/uL)   Hemoglobin 13.5  12.0 - 15.0 (g/dL)   HCT 38.8  36.0 - 46.0 (%)   MCV 89.2  78.0 - 100.0 (fL)   MCH 31.0  26.0 - 34.0 (pg)   MCHC 34.8  30.0 - 36.0 (g/dL)   RDW 13.0  11.5 - 15.5 (%)   Platelets 264  150 - 400 (K/uL)  DIFFERENTIAL      Component Value Range   Neutrophils Relative 46  43 - 77 (%)   Neutro Abs 4.2  1.7 - 7.7 (K/uL)   Lymphocytes Relative 43  12 - 46 (%)   Lymphs Abs 3.9  0.7 - 4.0 (K/uL)   Monocytes Relative 8  3 - 12 (%)   Monocytes Absolute 0.8  0.1 - 1.0 (K/uL)   Eosinophils Relative 2  0 - 5 (%)   Eosinophils Absolute 0.2  0.0 - 0.7 (K/uL)   Basophils Relative 0  0 - 1 (%)   Basophils Absolute 0.0  0.0 - 0.1 (K/uL)  COMPREHENSIVE METABOLIC PANEL      Component Value Range   Sodium 139  135 - 145 (mEq/L)   Potassium 4.1  3.5 - 5.1 (mEq/L)   Chloride 100  96 - 112 (mEq/L)   CO2 25  19 - 32 (mEq/L)   Glucose, Bld 208 (*) 70 - 99 (mg/dL)    BUN 20  6 - 23 (mg/dL)   Creatinine, Ser 1.68 (*) 0.50 - 1.10 (mg/dL)   Calcium 10.8 (*) 8.4 - 10.5 (mg/dL)   Total Protein 8.3  6.0 - 8.3 (g/dL)   Albumin 4.3  3.5 - 5.2 (g/dL)   AST 16  0 - 37 (U/L)   ALT 10  0 - 35 (U/L)   Alkaline Phosphatase 94  39 - 117 (U/L)   Total Bilirubin 0.4  0.3 - 1.2 (mg/dL)   GFR calc non Af Amer 36 (*) >90 (mL/min)   GFR calc Af Amer 41 (*) >90 (mL/min)  LIPASE, BLOOD      Component Value Range   Lipase 113 (*) 11 - 59 (U/L)  URINALYSIS, ROUTINE W REFLEX MICROSCOPIC      Component Value Range   Color, Urine YELLOW  YELLOW    APPearance CLOUDY (*) CLEAR    Specific Gravity, Urine 1.017  1.005 - 1.030    pH 6.5  5.0 - 8.0    Glucose, UA 100 (*) NEGATIVE (mg/dL)   Hgb urine dipstick NEGATIVE  NEGATIVE    Bilirubin Urine NEGATIVE  NEGATIVE    Ketones, ur 15 (*) NEGATIVE (mg/dL)   Protein, ur 100 (*) NEGATIVE (mg/dL)   Urobilinogen, UA 1.0  0.0 - 1.0 (mg/dL)   Nitrite NEGATIVE  NEGATIVE    Leukocytes, UA TRACE (*) NEGATIVE   PREGNANCY,  URINE      Component Value Range   Preg Test, Ur NEGATIVE    URINE MICROSCOPIC-ADD ON      Component Value Range   Squamous Epithelial / LPF FEW (*) RARE    WBC, UA 0-2  <3 (WBC/hpf)   RBC / HPF 0-2  <3 (RBC/hpf)   Bacteria, UA RARE  RARE    Casts HYALINE CASTS (*) NEGATIVE    No results found.  Date: 01/14/2012  Rate: 106  Rhythm: sinus tachycardia  QRS Axis: normal  Intervals: normal  ST/T Wave abnormalities: septal infarct, biatrial enlargement  Conduction Disutrbances:none  Narrative Interpretation: Reviewed by Dr. Thad Ranger  Old EKG Reviewed: unchanged    Cyclical vomiting syndrome   MDM  Patient given a liter of fluids, dilaudid, ativan and phenergan, she has been here 5 hours and no vomiting once medications given.  I have reviewed her labs and spoke with the family practice resident about the patient.  There is no real change in her labs, and since she is able to keep down po fluids and has had  no vomiting since arrival we believe that the patient can safely be returned home.  She is to be seen with them tomorrow morning at 1030am.        Joaquim Lai C. [place], [place] 01/13/12 2349  Idalia Needle [place], [place] 01/14/12 [phone]

## 2012-01-13 NOTE — Assessment & Plan Note (Signed)
Here today after being seen by the hospital twice this week. Seems well enough to go home. Was given Phenergan, Ativan, morphine while here and is feeling a little bit better. Refilled prescriptions for oxycodone, Phenergan, Ativan today. See back in 2 weeks.

## 2012-01-13 NOTE — Progress Notes (Signed)
  Subjective:    Patient ID: [name] [name], female    DOB: 06-18-1966, 46 y.o.   MRN: [id number]  HPI Patient here for cyclic vomiting episode. This started this morning. She's had 2 episodes this week there were better with emergency room care. She said that she took her medicines this morning but took about 20 minutes later. She denies any fevers. She states that she stopped her Senokot one week ago because she thought it might be contributing to gas. She is again having trouble with constipation and had a stool today that was round hard balls. The symptoms are similar to her last episode. There is no change.  Review of Systems Denies headache, shortness of breath, chest pain    Objective:   Physical Exam GEN-mildly uncomfortable appearing, tearful Psych-tearful, speaking of being depressed, denies SI HI Heart - normal rate, regular rhythm, normal S1, S2, no murmurs, rubs, clicks or gallops Chest - clear to auscultation, no wheezes, rales or rhonchi, symmetric air entry, no tachypnea, retractions or cyanosis Abdomen-tender diffusely, bowel sounds are present, mildly distended Extremities - peripheral pulses normal, no pedal edema, no clubbing or cyanosis        Assessment & Plan:

## 2012-01-13 NOTE — ED Notes (Signed)
No further vomiting since phenergan.

## 2012-01-13 NOTE — ED Notes (Signed)
Pt continues to sleep undisturbed.

## 2012-01-13 NOTE — Telephone Encounter (Signed)
Patient called crying.  Was seen today by PCP.  Still having N & V and pain.  Has taken Ativan & Phenergan and fell asleep.  Symptoms returned when she woke up.  Spoke with Dr. Charlett Blake and informed patient to continue taking meds prescribed today.  States she is unable to keep any meds down due to vomiting. Informed patient that she can go to ED if  symptoms worsen and she feels need to be reevaluated.  Patient verbalized understanding.  Nolene Ebbs, RN

## 2012-01-17 ENCOUNTER — Encounter (HOSPITAL_COMMUNITY): Payer: Self-pay | Admitting: Family Medicine

## 2012-01-17 ENCOUNTER — Ambulatory Visit (INDEPENDENT_AMBULATORY_CARE_PROVIDER_SITE_OTHER): Payer: Medicare Other | Admitting: Family Medicine

## 2012-01-17 ENCOUNTER — Emergency Department (HOSPITAL_COMMUNITY): Payer: Medicare Other

## 2012-01-17 ENCOUNTER — Encounter: Payer: Self-pay | Admitting: Family Medicine

## 2012-01-17 ENCOUNTER — Emergency Department (HOSPITAL_COMMUNITY)
Admission: EM | Admit: 2012-01-17 | Discharge: 2012-01-17 | Disposition: A | Discharge status: Home / Self Care | Payer: Medicare Other | Attending: Emergency Medicine | Admitting: Emergency Medicine

## 2012-01-17 VITALS — BP 171/109 | HR 105 | Temp 98.4°F | Ht 61.5 in | Wt 166.0 lb

## 2012-01-17 DIAGNOSIS — R109 Unspecified abdominal pain: Secondary | ICD-10-CM | POA: Insufficient documentation

## 2012-01-17 DIAGNOSIS — R1115 Cyclical vomiting syndrome unrelated to migraine: Secondary | ICD-10-CM

## 2012-01-17 DIAGNOSIS — I10 Essential (primary) hypertension: Secondary | ICD-10-CM | POA: Insufficient documentation

## 2012-01-17 DIAGNOSIS — G8929 Other chronic pain: Secondary | ICD-10-CM | POA: Insufficient documentation

## 2012-01-17 DIAGNOSIS — R1084 Generalized abdominal pain: Secondary | ICD-10-CM | POA: Diagnosis not present

## 2012-01-17 DIAGNOSIS — R1013 Epigastric pain: Secondary | ICD-10-CM | POA: Diagnosis not present

## 2012-01-17 DIAGNOSIS — Z7982 Long term (current) use of aspirin: Secondary | ICD-10-CM | POA: Insufficient documentation

## 2012-01-17 DIAGNOSIS — E119 Type 2 diabetes mellitus without complications: Secondary | ICD-10-CM | POA: Insufficient documentation

## 2012-01-17 DIAGNOSIS — F17200 Nicotine dependence, unspecified, uncomplicated: Secondary | ICD-10-CM | POA: Insufficient documentation

## 2012-01-17 DIAGNOSIS — Z79899 Other long term (current) drug therapy: Secondary | ICD-10-CM | POA: Insufficient documentation

## 2012-01-17 LAB — BASIC METABOLIC PANEL
BUN: 24 mg/dL — ABNORMAL HIGH (ref 6–23)
GFR calc Af Amer: 32 mL/min — ABNORMAL LOW (ref >90)
GFR calc non Af Amer: 28 mL/min — ABNORMAL LOW (ref >90)
Potassium: 4.8 mEq/L (ref 3.5–5.1)

## 2012-01-17 LAB — URINALYSIS, ROUTINE W REFLEX MICROSCOPIC
Bilirubin Urine: NEGATIVE
Nitrite: NEGATIVE
Specific Gravity, Urine: 1.019 (ref 1.005–1.030)
Urobilinogen, UA: 1 mg/dL (ref 0.0–1.0)

## 2012-01-17 LAB — DIFFERENTIAL
Basophils Absolute: 0 10*3/uL (ref 0.0–0.1)
Basophils Relative: 0 % (ref 0–1)
Monocytes Absolute: 0.6 10*3/uL (ref 0.1–1.0)
Neutro Abs: 3.6 10*3/uL (ref 1.7–7.7)
Neutrophils Relative %: 49 % (ref 43–77)

## 2012-01-17 LAB — CBC
MCHC: 34.3 g/dL (ref 30.0–36.0)
Platelets: 255 10*3/uL (ref 150–400)
RDW: 12.9 % (ref 11.5–15.5)

## 2012-01-17 LAB — URINE MICROSCOPIC-ADD ON

## 2012-01-17 MED ORDER — MORPHINE SULFATE 4 MG/ML IJ SOLN
2.0000 mg | Freq: Once | INTRAMUSCULAR | Status: AC
  Administered 2012-01-17: 2 mg via INTRAMUSCULAR

## 2012-01-17 MED ORDER — HYDROMORPHONE HCL PF 1 MG/ML IJ SOLN
1.0000 mg | Freq: Once | INTRAMUSCULAR | Status: AC
Start: 2012-01-17 — End: 2012-01-17
  Administered 2012-01-17: 1 mg via INTRAVENOUS
  Filled 2012-01-17: qty 1

## 2012-01-17 MED ORDER — SODIUM CHLORIDE 0.9 % IV BOLUS (SEPSIS)
1000.0000 mL | Freq: Once | INTRAVENOUS | Status: AC
  Administered 2012-01-17: 1000 mL via INTRAVENOUS

## 2012-01-17 MED ORDER — PROMETHAZINE HCL 25 MG/ML IJ SOLN
50.0000 mg | Freq: Once | INTRAMUSCULAR | Status: AC
  Administered 2012-01-17: 50 mg via INTRAMUSCULAR

## 2012-01-17 MED ORDER — PROMETHAZINE HCL 25 MG/ML IJ SOLN
25.0000 mg | Freq: Once | INTRAMUSCULAR | Status: AC
  Administered 2012-01-17: 25 mg via INTRAVENOUS
  Filled 2012-01-17: qty 1

## 2012-01-17 MED ORDER — HYDROMORPHONE HCL PF 1 MG/ML IJ SOLN
1.0000 mg | Freq: Once | INTRAMUSCULAR | Status: AC
  Administered 2012-01-17: 1 mg via INTRAVENOUS
  Filled 2012-01-17: qty 1

## 2012-01-17 NOTE — ED Notes (Signed)
[id number] Expected date:<BR> Expected time:<BR> Means of arrival:<BR> Comments:<BR> EMS

## 2012-01-17 NOTE — Progress Notes (Signed)
Addended by: Riley Churches E on: 01/17/2012 12:29 PM   Modules accepted: Orders

## 2012-01-17 NOTE — ED Notes (Signed)
Pt given discharge info, verb understanding. amb indep to discharge window

## 2012-01-17 NOTE — Progress Notes (Signed)
  Subjective:    Patient ID: [name] [name], female    DOB: 13-Jan-1966, 46 y.o.   MRN: [id number]  HPI  Here for continues nausea and vomiting  PMH sig for cyclic vomiting, gastroparesis, DM.  Reviewed ER visit and last visit with PCP.  No known triggers for worsening this episodes, stated took some flexeril the day prior but this is not a new med.  Reports no recent drug use or stressor.  Has been taking Lantus as usual.  Not usuing meal coverage while not taking much PO.  Was seen in office on Dec 17th at start, refilled on medicines of zofran, oxycodone, phenergan, ativan, reglan.  Was also seen in ER same night, given medications and discharged.  Course stable.  No diarrhea or constipation.  Epigastric abdominal pain from emesis.  No bloody emesis.  Declines need for use of suppositories, states they dont work.  Did not go to GI referral made by Dr. Ernestina Patches Oct 2012, nor in Spring 2012.  Review of Systems No fever, chills    Objective:   Physical Exam GEN: Alert & Oriented, crying on exam table.  "tired of being sick" CV:  Regular Rate & Rhythm, no murmur Respiratory:  Normal work of breathing, CTAB Abd:  Quiet bowel sounds, soft, no tenderness to palpation in lower abdomen.  Tender in epigastric area. Ext: no pre-tibial edema        Assessment & Plan:

## 2012-01-17 NOTE — ED Notes (Signed)
Pt reports having cyclic vomiting syndrome. States she is having severe abdominal pain and vomiting x5 days. Was seen at [facility] for same and discharged home. States she went to her pcp this morning and received a shot of morphine and phenergan with no relief.

## 2012-01-17 NOTE — ED Provider Notes (Signed)
History     CSN: [id number]  Arrival date & time 01/17/12  1326   First MD Initiated Contact with Patient 01/17/12 1431      Chief Complaint  Patient presents with  . Abdominal Pain  . Emesis    (Consider location/radiation/quality/duration/timing/severity/associated sxs/prior treatment) HPI Comments: Patient presents to the emergency department with abdominal pain and emesis.  She states she's been having these symptoms for the last 5 days.  The patient was seen by a [name] already for the same complaints and discharged.  Patient has a history of cyclic vomiting syndrome, pancreatitis, and diabetes.  Patient states her pain is 10 out of 10 in severity with localization. Pt states this does not feel different form her chronic pain.  Patient reports nausea and vomiting but denies diarrhea or constipation.  She denies hematemesis, fevers, night sweats, chills.  The history is provided by the patient.    Past Medical History  Diagnosis Date  . PANCREATITIS 05/09/2008  . NEUROPATHY, DIABETIC 02/23/2007  . Cyclic vomiting syndrome   . Hypertension   . Diabetes mellitus     Past Surgical History  Procedure Date  . Port-a-cath placement   . Cholecystectomy   . Abdominal hysterectomy   . Laparotomy and lysis of adhesions     Family History  Problem Relation Age of Onset  . Diabetes type II Mother   . Diabetes type II Sister     History  Substance Use Topics  . Smoking status: Current Some Day Smoker -- 0.5 packs/day for 22 years  . Smokeless tobacco: Never Used   Comment: 5 per day   . Alcohol Use: No    OB History    Grav Para Term Preterm Abortions TAB SAB Ect Mult Living                  Review of Systems  Constitutional: Positive for appetite change. Negative for fever and chills.  HENT: Negative for neck stiffness and dental problem.   Eyes: Negative for visual disturbance.  Respiratory: Negative for cough, chest tightness, shortness of breath and  wheezing.   Cardiovascular: Negative for chest pain, palpitations and leg swelling.  Gastrointestinal: Positive for nausea, vomiting and abdominal pain. Negative for diarrhea, constipation, blood in stool, abdominal distention, anal bleeding and rectal pain.  Genitourinary: Negative for dysuria, urgency, hematuria and flank pain.  Musculoskeletal: Negative for myalgias and arthralgias.  Skin: Negative for rash.  Neurological: Negative for dizziness, syncope, speech difficulty, numbness and headaches.  Hematological: Does not bruise/bleed easily.  All other systems reviewed and are negative.    Allergies  Erythromycin; Acetaminophen; and Oxycodone-acetaminophen  Home Medications   Current Outpatient Rx  Name Route Sig Dispense Refill  . ASPIRIN 81 MG PO TABS Oral Take 81 mg by mouth daily.      Marland Kitchen CARVEDILOL 12.5 MG PO TABS Oral Take 1 tablet (12.5 mg total) by mouth 2 (two) times daily with a meal. 30 tablet 0  . CYCLOBENZAPRINE HCL 5 MG PO TABS Oral Take 5 mg by mouth 3 (three) times daily as needed. For spasms    . EPINEPHRINE 0.3 MG/0.3ML IJ DEVI  One dose Im as needed for severe anaphylaxis.    . INSULIN ASPART 100 UNIT/ML [place] SOLN Subcutaneous Inject 6 Units into the skin 3 (three) times daily before meals.    . INSULIN GLARGINE 100 UNIT/ML [place] SOLN Subcutaneous Inject 10 Units into the skin every morning. . 10 mL 11  . LIDODERM  5 % EX PTCH Transdermal Place 1 patch onto the skin daily as needed. For pain    . LISINOPRIL 20 MG PO TABS Oral Take 20 mg by mouth daily.    Marland Kitchen LORAZEPAM 1 MG PO TABS Oral Take 1 mg by mouth every 4 (four) hours as needed. For anxiety     . MICONAZOLE NITRATE 2 % [place] CREA Vaginal Place 1 applicator vaginally at bedtime.    Marland Kitchen NICOTINE 7 MG/24HR TD PT24 Transdermal Place 1 patch onto the skin daily.    Marland Kitchen OMEPRAZOLE 20 MG PO CPDR Oral Take 2 capsules (40 mg total) by mouth daily. 30 capsule 11  . OXYCODONE HCL 10 MG PO TABS Oral Take 10 mg by mouth every 6 (six)  hours as needed. For pain    . SENNA 8.6 MG PO TABS Oral Take 2 tablets (17.2 mg total) by mouth 2 (two) times daily. For constipation 60 each 3  . SUCRALFATE 1 G PO TABS Oral Take 1 tablet (1 g total) by mouth 4 (four) times daily -  with meals and at bedtime. 120 tablet 11  . NEEDLE (DISP) 23G X 1-1/2" MISC Does not apply 1 Device by Does not apply route 3 (three) times daily as needed. 100 each 11  . SYRINGE (DISPOSABLE) 2.5 ML MISC Does not apply 1 Device by Does not apply route 3 (three) times daily as needed. 100 each 11    BP 184/101  Pulse 110  Temp(Src) 98.6 F (37 C) (Oral)  Resp 20  Ht 5' 1.5" (1.562 m)  Wt 165 lb (74.844 kg)  BMI 30.67 kg/m2  SpO2 100%  Physical Exam  Nursing note and vitals reviewed. Constitutional: She is oriented to person, place, and time. She appears well-developed and well-nourished. No distress.       Hypertensive, tachycardic, tearful  HENT:  Head: Normocephalic and atraumatic.  Eyes: Conjunctivae and EOM are normal.  Neck: Normal range of motion.  Pulmonary/Chest: Effort normal.  Abdominal: Soft. Bowel sounds are normal. There is generalized tenderness. There is no rigidity, no rebound, no guarding, no CVA tenderness, no tenderness at McBurney's point and negative Murphy's sign.    Musculoskeletal: Normal range of motion.  Neurological: She is alert and oriented to person, place, and time.  Skin: Skin is warm and dry. No rash noted. She is not diaphoretic.  Psychiatric: She has a normal mood and affect. Her behavior is normal.    ED Course  Procedures (including critical care time)  Labs Reviewed  URINALYSIS, ROUTINE W REFLEX MICROSCOPIC - Abnormal; Notable for the following:    APPearance CLOUDY (*)    Glucose, UA >1000 (*)    Hgb urine dipstick TRACE (*)    Ketones, ur TRACE (*)    Protein, ur 100 (*)    All other components within normal limits  BASIC METABOLIC PANEL - Abnormal; Notable for the following:    Glucose, Bld 356 (*)     BUN 24 (*)    Creatinine, Ser 2.05 (*)    Calcium 10.7 (*)    GFR calc non Af Amer 28 (*)    GFR calc Af Amer 32 (*)    All other components within normal limits  LIPASE, BLOOD - Abnormal; Notable for the following:    Lipase 100 (*)    All other components within normal limits  URINE MICROSCOPIC-ADD ON - Abnormal; Notable for the following:    Squamous Epithelial / LPF MANY (*)    Bacteria,  UA FEW (*)    All other components within normal limits  CBC  DIFFERENTIAL   Dg Abd Acute W/chest  01/17/2012  *RADIOLOGY REPORT*  Clinical Data: Epigastric pain  ACUTE ABDOMEN SERIES (ABDOMEN 2 VIEW & CHEST 1 VIEW)  Comparison: 12/05/2011  Findings: Cardiomediastinal silhouette is stable.  No acute infiltrate or pleural effusion. Mild thoracic dextroscoliosis. Right subclavian Port-A-Cath is unchanged in position.  There is nonspecific nonobstructive bowel gas pattern.  Post cholecystectomy surgical clips are noted.  Stool noted in proximal transverse colon. No free abdominal air.  IMPRESSION: No acute disease.  Nonspecific nonobstructive bowel gas pattern. No free abdominal air.  Post cholecystectomy surgical clips are noted.  Stool noted proximal transverse colon  Original Report Authenticated By: Lahoma Crocker, M.D.     No diagnosis found.  Patient has been observed in the emergency department for 5 hours. XR shows no evidence of abdominal obstruction. Pt did not have a witnessed emesis episode while here. She states that her pain has improved with treatment provided.  Patient has received fluids, Dilaudid, and Phenergan.  Patient states concerned that her symptoms will come back when she leaves the hospital.  It has been recommended for her to followup with GI.  Patient states she had a GI doctor, however he believed that he could not help her. Another GI doctor will be given to pt at dc as well as info for [facility].   MDM  Chronic abdominal pain        Verl Dicker, [place] 01/17/12  1851

## 2012-01-17 NOTE — Assessment & Plan Note (Addendum)
Episode consistent with chronic cyclic vomiting/gastroparesis.  No evidence of bowel obstruction, labs reviewed from ER at start of this- mild elevation in lipase.  Appears well hydrated today  Will give her usual regimen in office- phenergan 50 mg IM, morphine 2 mg IM.  Ativan 2 mg IM not available in office.    She states she lives close by and does not oversedate her in the past.  Will continue to use home medications.  Given red flags for urgent follow-up.  Otherwise will follow-up with PCP for continued chronic management.

## 2012-01-17 NOTE — Patient Instructions (Signed)
Cyclic Vomiting Syndrome Cyclic vomiting syndrome (CVS) is a benign condition in which patients experience bouts or cycles of severe nausea and vomiting that last for hours or even days. The bouts of nausea and vomiting alternate with longer periods of no symptoms and generally good health. CVS occurs mostly in children, but can affect adults. CVS has no known cause. Each episode is typically similar to the previous ones. The episodes tend to:    Start at about the same time of day.     Last the same length of time.     Present the same symptoms at the same level of intensity.  CVS can begin at any age in children and adults. CVS usually starts between the ages of 61 and 61. In adults, episodes tend to occur less often than they do in children, but they last longer. Furthermore, the events or situations that trigger episodes in adults cannot always be pinpointed as easily as they can in children. THE FOUR PHASES OF CVS 1. Prodrome.    2. Episode.   3. Recovery.   4. Symptom-free interval.  The prodrome phase signals that an episode of nausea and vomiting is about to begin. This phase can last from just a few minutes to several hours. This phase is often marked by belly (abdominal) pain. Sometimes taking medicine early in the prodrome phase can stop an episode in progress. However, sometimes there is no warning. A person may simply wake up in the middle of the night or early morning and begin vomiting. The episode phase consists of:  Severe vomiting.     Nausea.    Gagging (retching).  The recovery phase begins when the nausea and vomiting stop. Healthy color, appetite, and energy return. The symptom-free interval phase is the period between episodes when no symptoms are present. TRIGGERS Episodes can be triggered by an infection or event. Examples of triggers include:  Infections.     Colds, allergies, sinus problems, and the flu.     Eating certain foods such as chocolate or cheese.       Foods with MSG or preservatives.     Fast foods.     Pre-packaged foods.     Foods with low nutritional value (junk foods).     Overeating.    Eating just before going to bed.     Hot weather.     Dehydration.    Not enough sleep or poor sleep quality.     Physical exhaustion.     Menstruation.    Motion sickness.     Emotional stress (school or home difficulties).     Excitement or stress.  SYMPTOMS   The main symptoms of CVS are:  Severe vomiting.     Nausea.    Gagging (retching).  Episodes usually begin at night or the first thing in the morning. Episodes may include vomiting or retching up to 5 or 6 times an hour during the worst of the episode. Episodes usually last anywhere from 1 to 4 days. Episodes can last for up to 10 days. Other symptoms include:  Paleness.     Exhaustion.    Listlessness.    Abdominal pain.     Loose stools or diarrhea.  Sometimes the nausea and vomiting are so severe that a person appears to be almost unconscious. Sensitivity to light, headache, fever, dizziness, may also accompany an episode. In addition, the vomiting may cause drooling and excessive thirst. Drinking water usually leads to more vomiting, though  the water can dilute the acid in the vomit, making the episode a little less painful. Continuous vomiting can lead to dehydration, which means that the body has lost excessive water and salts. DIAGNOSIS   CVS is hard to diagnose because there are no clear tests to identify it. A caregiver must diagnose CVS by looking at symptoms and medical history. A caregiver must exclude more common diseases or disorders that can also cause nausea and vomiting. Also, diagnosis takes time because caregivers need to identify a pattern or cycle to the vomiting. CVS AND MIGRAINE The relationship between migraine and CVS is still unclear. Medical researchers believe that they are related for 3 reasons: 1. Migraine headaches (which cause  severe pain in the head), abdominal migraine (which causes stomach pain), and CVS are all marked by severe symptoms that start quickly and end abruptly, followed by longer periods without pain or other symptoms.    2. Many of the situations that trigger CVS also trigger migraines. Those triggers include stress and excitement.    3. Research has shown that many children with CVS either have a family history of migraine or develop migraines as they grow older.  Because of the similarities between migraine and CVS, caregivers treat some people with severe CVS with drugs that are also used for migraine headaches. The drugs are designed to:  Prevent episodes.     Reduce their frequency.     Lessen their severity.  TREATMENT   CVS cannot be cured. Treatment varies, but people with CVS should get plenty of rest and sleep and take medications that prevent, stop, or lessen the vomiting episodes and other symptoms. Once a vomiting episode begins, treatment is supportive. It helps to stay in bed and sleep in a dark, quiet room. Severe nausea and vomiting may require hospitalization and intravenous (IV) fluids to prevent dehydration. Relaxing medications (sedatives) may help if the nausea continues. Sometimes, during the prodrome phase, it is possible to stop an episode from happening altogether. Only take over-the-counter or prescription medicines for pain, discomfort or fever as directed by your caregiver. Do not give aspirin to children. During the recovery phase, drinking water and replacing lost electrolytes (salts in the blood) are very important. Electrolytes are salts that the body needs to function well and stay healthy. Symptoms during the recovery phase can vary. Some people find that their appetites return to normal immediately, while others need to begin by drinking clear liquids and then move slowly to solid food. People whose episodes are frequent and long-lasting may be treated during the  symptom-free intervals in an effort to prevent or ease future episodes. Medications that help people with migraine headaches are sometimes used during this phase, but they do not work for everyone. Taking the medicine daily for 1 to 2 months may be necessary to see if it helps. The symptom-free phase is a good time to eliminate anything known to trigger an episode. For example, if episodes are brought on by stress or excitement, this period is the time to find ways to reduce stress and stay calm. If sinus problems or allergies cause episodes, those conditions should be treated. The triggers listed above should be avoided or prevented. RELATED COMPLICATIONS The severe vomiting that defines CVS is a risk factor for several complications:  Dehydration. Vomiting causes the body to lose water quickly.     Electrolyte imbalance. Vomiting also causes the body to lose the important salts it needs to keep working properly.  Peptic esophagitis. The tube that connects the mouth to the stomach (esophagus) becomes injured from the stomach acid that comes up with the vomit.     Hematemesis. The esophagus becomes irritated and bleeds, so blood mixes with the vomit.     Mallory-Weiss tear. The lower end of the esophagus may tear open or the stomach may bruise from vomiting or retching.     Tooth decay. The acid in the vomit can hurt the teeth by corroding the tooth enamel.  SEEK MEDICAL CARE IF: You have questions or problems. Document Released: 02/21/2002 Document Revised: 06/28/2011 Document Reviewed: 03/22/2011 [facility] Patient Information 2012 [place].

## 2012-01-18 NOTE — ED Provider Notes (Signed)
Medical screening examination/treatment/procedure(s) were performed by non-physician practitioner and as supervising physician I was immediately available for consultation/collaboration.  Julianne Rice, MD 01/18/12 [phone]

## 2012-01-18 NOTE — ED Provider Notes (Signed)
Medical screening examination/treatment/procedure(s) were conducted as a shared visit with non-physician practitioner(s) and myself.  I personally evaluated the patient during the encounter  Julianne Rice, MD 01/18/12 [phone]

## 2012-01-20 ENCOUNTER — Emergency Department (HOSPITAL_COMMUNITY): Payer: Medicare Other

## 2012-01-20 ENCOUNTER — Encounter (HOSPITAL_COMMUNITY): Payer: Self-pay | Admitting: Emergency Medicine

## 2012-01-20 ENCOUNTER — Other Ambulatory Visit: Payer: Self-pay

## 2012-01-20 ENCOUNTER — Observation Stay (HOSPITAL_COMMUNITY)
Admission: EM | Admit: 2012-01-20 | Discharge: 2012-01-22 | Disposition: A | Discharge status: Home / Self Care | Payer: Medicare Other | Source: Ambulatory Visit | Attending: Family Medicine | Admitting: Family Medicine

## 2012-01-20 DIAGNOSIS — F3289 Other specified depressive episodes: Secondary | ICD-10-CM | POA: Insufficient documentation

## 2012-01-20 DIAGNOSIS — R109 Unspecified abdominal pain: Secondary | ICD-10-CM | POA: Diagnosis not present

## 2012-01-20 DIAGNOSIS — I10 Essential (primary) hypertension: Secondary | ICD-10-CM | POA: Diagnosis present

## 2012-01-20 DIAGNOSIS — F1290 Cannabis use, unspecified, uncomplicated: Secondary | ICD-10-CM | POA: Diagnosis present

## 2012-01-20 DIAGNOSIS — N183 Chronic kidney disease, stage 3 unspecified: Secondary | ICD-10-CM | POA: Diagnosis not present

## 2012-01-20 DIAGNOSIS — E119 Type 2 diabetes mellitus without complications: Secondary | ICD-10-CM | POA: Insufficient documentation

## 2012-01-20 DIAGNOSIS — R1084 Generalized abdominal pain: Secondary | ICD-10-CM | POA: Diagnosis not present

## 2012-01-20 DIAGNOSIS — F17200 Nicotine dependence, unspecified, uncomplicated: Secondary | ICD-10-CM | POA: Diagnosis not present

## 2012-01-20 DIAGNOSIS — Z720 Tobacco use: Secondary | ICD-10-CM | POA: Diagnosis present

## 2012-01-20 DIAGNOSIS — R079 Chest pain, unspecified: Secondary | ICD-10-CM | POA: Diagnosis not present

## 2012-01-20 DIAGNOSIS — R1115 Cyclical vomiting syndrome unrelated to migraine: Secondary | ICD-10-CM | POA: Diagnosis not present

## 2012-01-20 DIAGNOSIS — F4310 Post-traumatic stress disorder, unspecified: Secondary | ICD-10-CM | POA: Diagnosis present

## 2012-01-20 DIAGNOSIS — IMO0002 Reserved for concepts with insufficient information to code with codable children: Secondary | ICD-10-CM | POA: Diagnosis present

## 2012-01-20 DIAGNOSIS — F411 Generalized anxiety disorder: Secondary | ICD-10-CM | POA: Diagnosis not present

## 2012-01-20 DIAGNOSIS — E118 Type 2 diabetes mellitus with unspecified complications: Secondary | ICD-10-CM

## 2012-01-20 DIAGNOSIS — I129 Hypertensive chronic kidney disease with stage 1 through stage 4 chronic kidney disease, or unspecified chronic kidney disease: Secondary | ICD-10-CM | POA: Insufficient documentation

## 2012-01-20 DIAGNOSIS — K219 Gastro-esophageal reflux disease without esophagitis: Secondary | ICD-10-CM | POA: Diagnosis present

## 2012-01-20 DIAGNOSIS — E860 Dehydration: Secondary | ICD-10-CM

## 2012-01-20 DIAGNOSIS — F329 Major depressive disorder, single episode, unspecified: Secondary | ICD-10-CM | POA: Insufficient documentation

## 2012-01-20 DIAGNOSIS — E1322 Other specified diabetes mellitus with diabetic chronic kidney disease: Secondary | ICD-10-CM | POA: Diagnosis present

## 2012-01-20 DIAGNOSIS — K859 Acute pancreatitis without necrosis or infection, unspecified: Secondary | ICD-10-CM | POA: Diagnosis not present

## 2012-01-20 DIAGNOSIS — R1013 Epigastric pain: Secondary | ICD-10-CM | POA: Insufficient documentation

## 2012-01-20 DIAGNOSIS — R112 Nausea with vomiting, unspecified: Secondary | ICD-10-CM | POA: Diagnosis not present

## 2012-01-20 DIAGNOSIS — I498 Other specified cardiac arrhythmias: Secondary | ICD-10-CM | POA: Diagnosis not present

## 2012-01-20 DIAGNOSIS — F419 Anxiety disorder, unspecified: Secondary | ICD-10-CM | POA: Diagnosis present

## 2012-01-20 LAB — POCT I-STAT, CHEM 8
Calcium, Ion: 1.19 mmol/L (ref 1.12–1.32)
Chloride: 107 mEq/L (ref 96–112)
Glucose, Bld: 345 mg/dL — ABNORMAL HIGH (ref 70–99)
HCT: 38 % (ref 36.0–46.0)
TCO2: 25 mmol/L (ref 0–100)

## 2012-01-20 LAB — COMPREHENSIVE METABOLIC PANEL
ALT: 10 U/L (ref 0–35)
CO2: 24 mEq/L (ref 19–32)
Calcium: 10.2 mg/dL (ref 8.4–10.5)
Creatinine, Ser: 1.84 mg/dL — ABNORMAL HIGH (ref 0.50–1.10)
GFR calc Af Amer: 37 mL/min — ABNORMAL LOW (ref >90)
GFR calc non Af Amer: 32 mL/min — ABNORMAL LOW (ref >90)
Glucose, Bld: 367 mg/dL — ABNORMAL HIGH (ref 70–99)
Sodium: 136 mEq/L (ref 135–145)
Total Protein: 8 g/dL (ref 6.0–8.3)

## 2012-01-20 LAB — CBC
HCT: 35 % — ABNORMAL LOW (ref 36.0–46.0)
Hemoglobin: 12.3 g/dL (ref 12.0–15.0)
MCH: 31 pg (ref 26.0–34.0)
MCHC: 35.1 g/dL (ref 30.0–36.0)

## 2012-01-20 LAB — DIFFERENTIAL
Eosinophils Absolute: 0.2 10*3/uL (ref 0.0–0.7)
Eosinophils Relative: 2 % (ref 0–5)
Lymphocytes Relative: 40 % (ref 12–46)
Lymphs Abs: 3.1 10*3/uL (ref 0.7–4.0)
Monocytes Absolute: 0.4 10*3/uL (ref 0.1–1.0)

## 2012-01-20 LAB — HEPATIC FUNCTION PANEL
ALT: <5 U/L (ref 0–35)
Bilirubin, Direct: <0.1 mg/dL (ref 0.0–0.3)
Total Protein: 7.6 g/dL (ref 6.0–8.3)

## 2012-01-20 LAB — URINE MICROSCOPIC-ADD ON

## 2012-01-20 LAB — URINALYSIS, ROUTINE W REFLEX MICROSCOPIC
Leukocytes, UA: NEGATIVE
Nitrite: NEGATIVE
Protein, ur: 30 mg/dL — AB
Specific Gravity, Urine: 1.015 (ref 1.005–1.030)
Urobilinogen, UA: 0.2 mg/dL (ref 0.0–1.0)

## 2012-01-20 LAB — LACTIC ACID, PLASMA: Lactic Acid, Venous: 1.1 mmol/L (ref 0.5–2.2)

## 2012-01-20 LAB — GLUCOSE, CAPILLARY: Glucose-Capillary: 170 mg/dL — ABNORMAL HIGH (ref 70–99)

## 2012-01-20 LAB — LIPASE, BLOOD: Lipase: 114 U/L — ABNORMAL HIGH (ref 11–59)

## 2012-01-20 LAB — POCT I-STAT TROPONIN I: Troponin i, poc: 0.01 ng/mL (ref 0.00–0.08)

## 2012-01-20 MED ORDER — GI COCKTAIL ~~LOC~~
30.0000 mL | Freq: Once | ORAL | Status: AC
  Administered 2012-01-20: 30 mL via ORAL

## 2012-01-20 MED ORDER — GI COCKTAIL ~~LOC~~
ORAL | Status: AC
  Filled 2012-01-20: qty 30

## 2012-01-20 MED ORDER — SODIUM CHLORIDE 0.9 % IV BOLUS (SEPSIS)
1000.0000 mL | Freq: Once | INTRAVENOUS | Status: AC
  Administered 2012-01-20: 1000 mL via INTRAVENOUS

## 2012-01-20 MED ORDER — PROMETHAZINE HCL 25 MG/ML IJ SOLN
25.0000 mg | INTRAMUSCULAR | Status: DC | PRN
  Administered 2012-01-21 (×2): 25 mg via INTRAVENOUS
  Filled 2012-01-20 (×2): qty 1

## 2012-01-20 MED ORDER — CARVEDILOL 12.5 MG PO TABS
12.5000 mg | ORAL_TABLET | Freq: Two times a day (BID) | ORAL | Status: DC
  Administered 2012-01-21 – 2012-01-22 (×3): 12.5 mg via ORAL
  Filled 2012-01-20 (×5): qty 1

## 2012-01-20 MED ORDER — LORAZEPAM 1 MG PO TABS
1.0000 mg | ORAL_TABLET | ORAL | Status: DC | PRN
  Administered 2012-01-21: 1 mg via ORAL
  Filled 2012-01-20: qty 1

## 2012-01-20 MED ORDER — LISINOPRIL 20 MG PO TABS
20.0000 mg | ORAL_TABLET | Freq: Every day | ORAL | Status: DC
  Administered 2012-01-21 – 2012-01-22 (×2): 20 mg via ORAL
  Filled 2012-01-20 (×2): qty 1

## 2012-01-20 MED ORDER — INSULIN ASPART 100 UNIT/ML ~~LOC~~ SOLN
0.0000 [IU] | SUBCUTANEOUS | Status: DC
  Administered 2012-01-21 (×3): 3 [IU] via SUBCUTANEOUS
  Filled 2012-01-20: qty 3

## 2012-01-20 MED ORDER — HYDROMORPHONE HCL PF 1 MG/ML IJ SOLN
1.0000 mg | Freq: Once | INTRAMUSCULAR | Status: AC
  Administered 2012-01-20: 1 mg via INTRAVENOUS
  Filled 2012-01-20: qty 1

## 2012-01-20 MED ORDER — MORPHINE SULFATE 4 MG/ML IJ SOLN
4.0000 mg | INTRAMUSCULAR | Status: DC | PRN
  Administered 2012-01-21: 4 mg via INTRAVENOUS
  Filled 2012-01-20: qty 1

## 2012-01-20 MED ORDER — PROCHLORPERAZINE MALEATE 10 MG PO TABS
10.0000 mg | ORAL_TABLET | Freq: Four times a day (QID) | ORAL | Status: DC | PRN
  Administered 2012-01-21: 10 mg via ORAL
  Filled 2012-01-20 (×2): qty 1

## 2012-01-20 MED ORDER — PROMETHAZINE HCL 25 MG PO TABS
25.0000 mg | ORAL_TABLET | ORAL | Status: DC | PRN
  Filled 2012-01-20: qty 1

## 2012-01-20 MED ORDER — SODIUM CHLORIDE 0.9 % IV SOLN
INTRAVENOUS | Status: DC
  Administered 2012-01-21 – 2012-01-22 (×3): via INTRAVENOUS

## 2012-01-20 MED ORDER — SUCRALFATE 1 G PO TABS
1.0000 g | ORAL_TABLET | Freq: Three times a day (TID) | ORAL | Status: DC
  Administered 2012-01-21 – 2012-01-22 (×5): 1 g via ORAL
  Filled 2012-01-20 (×9): qty 1

## 2012-01-20 MED ORDER — HEPARIN SODIUM (PORCINE) 5000 UNIT/ML IJ SOLN
5000.0000 [IU] | Freq: Three times a day (TID) | INTRAMUSCULAR | Status: DC
  Administered 2012-01-21 (×2): 5000 [IU] via SUBCUTANEOUS
  Filled 2012-01-20 (×8): qty 1

## 2012-01-20 MED ORDER — PROMETHAZINE HCL 25 MG RE SUPP: 25.0000 mg | Freq: Four times a day (QID) | RECTAL | Status: DC | PRN

## 2012-01-20 MED ORDER — ONDANSETRON HCL 4 MG/2ML IJ SOLN
4.0000 mg | Freq: Once | INTRAMUSCULAR | Status: AC
  Administered 2012-01-20: 4 mg via INTRAVENOUS
  Filled 2012-01-20: qty 2

## 2012-01-20 MED ORDER — ASPIRIN EC 81 MG PO TBEC
81.0000 mg | DELAYED_RELEASE_TABLET | Freq: Every day | ORAL | Status: DC
  Administered 2012-01-21 – 2012-01-22 (×2): 81 mg via ORAL
  Filled 2012-01-20 (×2): qty 1

## 2012-01-20 MED ORDER — SENNA 8.6 MG PO TABS
2.0000 | ORAL_TABLET | Freq: Two times a day (BID) | ORAL | Status: DC
  Administered 2012-01-21 – 2012-01-22 (×4): 17.2 mg via ORAL
  Filled 2012-01-20 (×5): qty 2

## 2012-01-20 MED ORDER — CYCLOBENZAPRINE HCL 10 MG PO TABS: 5.0000 mg | ORAL_TABLET | Freq: Three times a day (TID) | ORAL | Status: DC | PRN

## 2012-01-20 MED ORDER — PROMETHAZINE HCL 25 MG/ML IJ SOLN
25.0000 mg | INTRAMUSCULAR | Status: AC
  Administered 2012-01-20: 25 mg via INTRAVENOUS
  Filled 2012-01-20: qty 1

## 2012-01-20 MED ORDER — HYDROMORPHONE HCL PF 1 MG/ML IJ SOLN
INTRAMUSCULAR | Status: AC
  Filled 2012-01-20: qty 1

## 2012-01-20 MED ORDER — NICOTINE 7 MG/24HR TD PT24
7.0000 mg | MEDICATED_PATCH | TRANSDERMAL | Status: DC
  Administered 2012-01-21 (×2): 7 mg via TRANSDERMAL
  Filled 2012-01-20 (×3): qty 1

## 2012-01-20 MED ORDER — CARVEDILOL 12.5 MG PO TABS
12.5000 mg | ORAL_TABLET | Freq: Once | ORAL | Status: AC
  Administered 2012-01-20: 12.5 mg via ORAL
  Filled 2012-01-20: qty 1

## 2012-01-20 MED ORDER — MORPHINE SULFATE 4 MG/ML IJ SOLN
4.0000 mg | Freq: Once | INTRAMUSCULAR | Status: DC
  Filled 2012-01-20: qty 1

## 2012-01-20 MED ORDER — INSULIN GLARGINE 100 UNIT/ML ~~LOC~~ SOLN
10.0000 [IU] | SUBCUTANEOUS | Status: DC
  Administered 2012-01-21 – 2012-01-22 (×2): 10 [IU] via SUBCUTANEOUS
  Filled 2012-01-20: qty 3

## 2012-01-20 MED ORDER — PANTOPRAZOLE SODIUM 40 MG PO TBEC
40.0000 mg | DELAYED_RELEASE_TABLET | Freq: Every day | ORAL | Status: DC
  Administered 2012-01-21: 40 mg via ORAL
  Filled 2012-01-20: qty 1

## 2012-01-20 MED ORDER — LABETALOL HCL 5 MG/ML IV SOLN
20.0000 mg | Freq: Once | INTRAVENOUS | Status: AC
  Administered 2012-01-20: 10 mg via INTRAVENOUS
  Filled 2012-01-20: qty 4

## 2012-01-20 MED ORDER — HYDROMORPHONE HCL PF 1 MG/ML IJ SOLN
1.0000 mg | Freq: Once | INTRAMUSCULAR | Status: AC
  Administered 2012-01-20: 1 mg via INTRAVENOUS

## 2012-01-20 MED ORDER — ONDANSETRON HCL 4 MG PO TABS: 8.0000 mg | ORAL_TABLET | ORAL | Status: DC | PRN

## 2012-01-20 MED ORDER — PROMETHAZINE HCL 25 MG/ML IJ SOLN
25.0000 mg | Freq: Four times a day (QID) | INTRAMUSCULAR | Status: DC | PRN
  Administered 2012-01-20: 25 mg via INTRAVENOUS
  Filled 2012-01-20 (×2): qty 1

## 2012-01-20 MED ORDER — ONDANSETRON 8 MG/NS 50 ML IVPB
8.0000 mg | INTRAVENOUS | Status: DC | PRN
  Filled 2012-01-20: qty 8

## 2012-01-20 MED ORDER — OXYCODONE HCL 5 MG PO TABS
10.0000 mg | ORAL_TABLET | Freq: Four times a day (QID) | ORAL | Status: DC | PRN
  Administered 2012-01-21: 10 mg via ORAL
  Filled 2012-01-20 (×2): qty 2

## 2012-01-20 NOTE — ED Notes (Signed)
Report called to [phone].

## 2012-01-20 NOTE — ED Provider Notes (Addendum)
History     CSN: [id number]  Arrival date & time 01/20/12  1011   First MD Initiated Contact with Patient 01/20/12 1018      Chief Complaint  Patient presents with  . Abdominal Pain  . Emesis  . Nausea    (Consider location/radiation/quality/duration/timing/severity/associated sxs/prior treatment) HPI Patient is a 46 yo female with history of cyclical vomiting syndrome. She presents today complaining of 10 out of 10 abdominal pain. Patient is also complaining of nausea and vomiting at home. She has history of an abdominal hysterectomy and C-sections but denies other surgeries. Patient does not have any history of liver disease. Patient denies any chest pain or shortness of breath. She's had no fevers. Patient's last bowel movement was today. She denies any blood in her bowel movements or dark tarry stools. She denies any urinary symptoms or vaginal discharge. Pain is worse with palpation movement and better with rest. Past Medical History  Diagnosis Date  . PANCREATITIS 05/09/2008  . NEUROPATHY, DIABETIC 02/23/2007  . Cyclic vomiting syndrome   . Hypertension   . Diabetes mellitus     Past Surgical History  Procedure Date  . Port-a-cath placement   . Cholecystectomy   . Abdominal hysterectomy   . Laparotomy and lysis of adhesions     Family History  Problem Relation Age of Onset  . Diabetes type II Mother   . Diabetes type II Sister     History  Substance Use Topics  . Smoking status: Current Some Day Smoker -- 0.5 packs/day for 22 years  . Smokeless tobacco: Never Used   Comment: 5 per day   . Alcohol Use: No    OB History    Grav Para Term Preterm Abortions TAB SAB Ect Mult Living                  Review of Systems  Constitutional: Negative.   HENT: Negative.   Eyes: Negative.   Respiratory: Negative.   Cardiovascular: Negative.   Gastrointestinal: Positive for nausea, vomiting and abdominal pain.  Genitourinary: Negative.   Musculoskeletal: Negative.     Skin: Negative.   Neurological: Negative.   Hematological: Negative.   Psychiatric/Behavioral: Negative.   All other systems reviewed and are negative.    Allergies  Erythromycin; Acetaminophen; and Oxycodone-acetaminophen  Home Medications   Current Outpatient Rx  Name Route Sig Dispense Refill  . ASPIRIN 81 MG PO TABS Oral Take 81 mg by mouth daily.      Marland Kitchen CARVEDILOL 12.5 MG PO TABS Oral Take 1 tablet (12.5 mg total) by mouth 2 (two) times daily with a meal. 30 tablet 0  . CYCLOBENZAPRINE HCL 5 MG PO TABS Oral Take 5 mg by mouth 3 (three) times daily as needed. For spasms    . EPINEPHRINE 0.3 MG/0.3ML IJ DEVI  One dose Im as needed for severe anaphylaxis.    . INSULIN ASPART 100 UNIT/ML [place] SOLN Subcutaneous Inject 6 Units into the skin 3 (three) times daily before meals.    . INSULIN GLARGINE 100 UNIT/ML [place] SOLN Subcutaneous Inject 10 Units into the skin every morning. . 10 mL 11  . LIDODERM 5 % EX PTCH Transdermal Place 1 patch onto the skin daily as needed. For pain    . LISINOPRIL 20 MG PO TABS Oral Take 20 mg by mouth daily.    Marland Kitchen LORAZEPAM 1 MG PO TABS Oral Take 1 mg by mouth every 4 (four) hours as needed. For anxiety     .  MICONAZOLE NITRATE 2 % VA CREA Vaginal Place 1 applicator vaginally at bedtime.    Marland Kitchen NEEDLE (DISP) 23G X 1-1/2" MISC Does not apply 1 Device by Does not apply route 3 (three) times daily as needed. 100 each 11  . NICOTINE 7 MG/24HR TD PT24 Transdermal Place 1 patch onto the skin daily.    Marland Kitchen OMEPRAZOLE 20 MG PO CPDR Oral Take 2 capsules (40 mg total) by mouth daily. 30 capsule 11  . OXYCODONE HCL 10 MG PO TABS Oral Take 10 mg by mouth every 6 (six) hours as needed. For pain    . SENNA 8.6 MG PO TABS Oral Take 2 tablets (17.2 mg total) by mouth 2 (two) times daily. For constipation 60 each 3  . SUCRALFATE 1 G PO TABS Oral Take 1 tablet (1 g total) by mouth 4 (four) times daily -  with meals and at bedtime. 120 tablet 11  . SYRINGE (DISPOSABLE) 2.5 ML MISC  Does not apply 1 Device by Does not apply route 3 (three) times daily as needed. 100 each 11    BP 150/68  Pulse 80  Temp(Src) 98.1 F (36.7 C) (Oral)  Resp 16  SpO2 99%  Physical Exam  Nursing note and vitals reviewed. Constitutional: She is oriented to person, place, and time. She appears well-developed and well-nourished. No distress.       Tearful  HENT:  Head: Normocephalic and atraumatic.  Eyes: Conjunctivae and EOM are normal. Pupils are equal, round, and reactive to light.  Neck: Normal range of motion.  Cardiovascular: Regular rhythm, normal heart sounds and intact distal pulses.  Exam reveals no gallop and no friction rub.   No murmur heard. Pulmonary/Chest: Effort normal and breath sounds normal. No respiratory distress. She has no wheezes. She has no rales.  Abdominal: Soft. Bowel sounds are normal. She exhibits no distension. There is tenderness. There is no rebound and no guarding.       Diffuse tenderness to palpation. Patient does not have an acute abdomen  Musculoskeletal: Normal range of motion. She exhibits no edema and no tenderness.  Neurological: She is alert and oriented to person, place, and time. No cranial nerve deficit. She exhibits normal muscle tone. Coordination normal.  Skin: Skin is warm and dry.  Psychiatric: She has a normal mood and affect.    ED Course  Procedures (including critical care time)   Date: 01/20/2012  Rate: 103  Rhythm: sinus tachycardia  QRS Axis: normal  Intervals: normal  ST/T Wave abnormalities: nonspecific T wave changes  Conduction Disutrbances:none  Narrative Interpretation:   Old EKG Reviewed: unchanged  Labs Reviewed  CBC - Abnormal; Notable for the following:    HCT 35.0 (*)    All other components within normal limits  COMPREHENSIVE METABOLIC PANEL - Abnormal; Notable for the following:    Glucose, Bld 367 (*)    Creatinine, Ser 1.84 (*)    GFR calc non Af Amer 32 (*)    GFR calc Af Amer 37 (*)    All other  components within normal limits  LIPASE, BLOOD - Abnormal; Notable for the following:    Lipase 114 (*)    All other components within normal limits  URINALYSIS, ROUTINE W REFLEX MICROSCOPIC - Abnormal; Notable for the following:    Glucose, UA >1000 (*)    Hgb urine dipstick SMALL (*)    Protein, ur 30 (*)    All other components within normal limits  URINE MICROSCOPIC-ADD ON - Abnormal;  Notable for the following:    Squamous Epithelial / LPF FEW (*)    All other components within normal limits  POCT I-STAT, CHEM 8 - Abnormal; Notable for the following:    BUN 24 (*)    Creatinine, Ser 1.60 (*)    Glucose, Bld 345 (*)    All other components within normal limits  DIFFERENTIAL  LACTIC ACID, PLASMA  POCT I-STAT TROPONIN I  I-STAT TROPONIN I  I-STAT, CHEM 8  HEPATIC FUNCTION PANEL   Dg Chest 2 View  01/20/2012  *RADIOLOGY REPORT*  Clinical Data: Chest and abdominal pain.  Nausea vomiting. Hypertension.  CHEST - 2 VIEW  Comparison: 07/20/2011  Findings: Heart size is normal.  Both lungs are clear.  No evidence of pleural effusion.  No mass or lymphadenopathy identified.  Right- sided Port-A-Cath remains in appropriate position.  IMPRESSION: No active disease.  Original Report Authenticated By: Marlaine Hind, M.D.     1. Abdominal pain   2. Nausea & vomiting       MDM  Patient was seen by myself. Based on her presentation laboratory workup for abdominal pain is initiated. Patient did not have an acute abdomen. Patient was treated for her pain and nausea. She was also given IV fluids. She continued to be very hypertensive. 20 mg labetalol IV was given. Patient had improvement in her blood pressure. She did require 2 additional doses of pain medication. Patient later complained of some very minor chest pain. She had a troponin sent that was negative and an EKG was checked. There were no significant changes on this. Patient would intermittently become tachycardic and hypertensive her  pain medications wore off. Patient has had 3 admissions to the ED since the 17th. I spoke with the family practice service to evaluate the patient in the emergency department.  They agree with my assessment that the patient likely does not need admission. They will speak to her so that perhaps we can avoid future ER visits such as this or develop some other plan for the patient's care if necessary. Patient will be given a prescription for rectal Phenergan. Barring an addendum to this dictation patient will be discharged home and will followup with family medicine. Final disposition will be made by Dr. Blanchie Dessert. If there are any alterations the patient's plan of care please see note from Dr. Maryan Rued.        Chauncy Passy, MD 01/20/12 1714  Chauncy Passy, MD 01/20/12 [phone]

## 2012-01-20 NOTE — ED Notes (Signed)
Patient states she has epigastric pain and has been seen here x 3 for this pain. Patient is actively vomiting. Patient placed on monitor and IV Team at bedside to access patients port.

## 2012-01-20 NOTE — H&P (Signed)
[facility] Admission History and Physical  Patient name: [name] [name] Medical record number: [id number] Date of birth: 09/06/1966 Age: 46 y.o. Gender: female  Primary Care Provider: Marlana Salvage, MD, MD  Chief Complaint: Abd pain and emesis History of Present Illness: [name] [name] is a 46 y.o. year old female presenting with abdominal pain and emesis that has been present for the last week and a half.  Pt reports that, beginning on the 17th of this month, she has been having problems with multiple episodes of emesis daily and has had a hard time with keeping any food/drink down.  She also reports epigastric pain is present.  Her symptoms are the same symptoms she has been having for the past 7 years.  She does not report any significant worsening recently.  She does not report any blood in emesis or stool.  No constipation.  Is passing flatus.  Pt has had multiple visits to healthcare providers over the last 7 days and has been treated symptomatically.  Says treatments work until she gets home after which symptoms return.  Pt denies any chest discomfort, shortness of breath, visual changes, lower extremity edema, bleeding or any rashes.  Patient Active Problem List  Diagnoses  . DIABETES MELLITUS, II, COMPLICATIONS  . UNSPECIFIED ANEMIA  . DEPRESSION, MAJOR, RECURRENT  . ANXIETY  . TOBACCO DEPENDENCE  . MIGRAINE, UNSPEC., W/O INTRACTABLE MIGRAINE  . HYPERTENSION, BENIGN SYSTEMIC  . GERD  . BACK PAIN, CHRONIC  . INSOMNIA NOS  . HIGH RISK PATIENT  . MUSCLE SPASM, TRAPEZIUS  . Hot flashes  . Cyclic vomiting syndrome  . CKD (chronic kidney disease), stage III  . Marijuana smoker  . Cramps, extremity   Past Medical History: Past Medical History  Diagnosis Date  . PANCREATITIS 05/09/2008  . NEUROPATHY, DIABETIC 02/23/2007  . Cyclic vomiting syndrome   . Hypertension   . Diabetes mellitus     Past Surgical History: Past Surgical History    Procedure Date  . Port-a-cath placement   . Cholecystectomy   . Abdominal hysterectomy   . Laparotomy and lysis of adhesions     Social History: History   Social History  . Marital Status: Single    Spouse Name: N/A    Number of Children: N/A  . Years of Education: N/A   Social History Main Topics  . Smoking status: Current Some Day Smoker -- 0.5 packs/day for 22 years  . Smokeless tobacco: Never Used   Comment: 5 per day   . Alcohol Use: No  . Drug Use: 2 per week    Special: Marijuana  . Sexually Active: Yes    Birth Control/ Protection: None   Other Topics Concern  . None   Social History Narrative  . None    Family History: Family History  Problem Relation Age of Onset  . Diabetes type II Mother   . Diabetes type II Sister     Allergies: Allergies  Allergen Reactions  . Erythromycin Nausea And Vomiting  . Acetaminophen Nausea And Vomiting  . Oxycodone-Acetaminophen Itching and Nausea Only    Current Facility-Administered Medications  Medication Dose Route Frequency Provider Last Rate Last Dose  . 0.9 %  sodium chloride infusion   Intravenous Continuous Vickie Epley, MD      . aspirin EC tablet 81 mg  81 mg Oral Daily Vickie Epley, MD      . carvedilol (COREG) tablet 12.5 mg  12.5 mg Oral Once Chauncy Passy, MD  12.5 mg at 01/20/12 1651  . carvedilol (COREG) tablet 12.5 mg  12.5 mg Oral BID WC Vickie Epley, MD      . cyclobenzaprine (FLEXERIL) tablet 5 mg  5 mg Oral TID PRN Vickie Epley, MD      . gi cocktail suspension           . gi cocktail  30 mL Oral Once Chauncy Passy, MD   30 mL at 01/20/12 1706  . heparin injection 5,000 Units  5,000 Units Subcutaneous Q8H Vickie Epley, MD      . HYDROmorphone (DILAUDID) injection 1 mg  1 mg Intravenous Once Chauncy Passy, MD   1 mg at 01/20/12 1102  . HYDROmorphone (DILAUDID) injection 1 mg  1 mg Intravenous Once Chauncy Passy, MD   1 mg at 01/20/12 1212  . HYDROmorphone (DILAUDID) injection 1 mg  1 mg Intravenous Once Chauncy Passy, MD   1 mg at 01/20/12 1606  . HYDROmorphone (DILAUDID) injection 1 mg  1 mg Intravenous Once Blanchie Dessert, MD   1 mg at 01/20/12 2014  . insulin aspart (novoLOG) injection 0-15 Units  0-15 Units Subcutaneous Q4H Amber Hairford, MD      . insulin glargine (LANTUS) injection 10 Units  10 Units Subcutaneous Q0700 Vickie Epley, MD      . labetalol (NORMODYNE,TRANDATE) injection 20 mg  20 mg Intravenous Once Chauncy Passy, MD   10 mg at 01/20/12 1212  . lisinopril (PRINIVIL,ZESTRIL) tablet 20 mg  20 mg Oral Daily Vickie Epley, MD      . LORazepam (ATIVAN) tablet 1 mg  1 mg Oral Q4H PRN Vickie Epley, MD      . morphine 4 MG/ML injection 4 mg  4 mg Intravenous Q2H PRN Vickie Epley, MD      . nicotine (NICODERM CQ - dosed in mg/24 hr) patch 7 mg  7 mg Transdermal Q24H Vickie Epley, MD      . ondansetron [facility]) tablet 8 mg  8 mg Oral Q4H PRN Vickie Epley, MD       Or  . ondansetron (ZOFRAN) 8 mg/NS 50 ml IVPB  8 mg Intravenous Q4H PRN Vickie Epley, MD      . ondansetron [facility]) injection 4 mg  4 mg Intravenous Once Chauncy Passy, MD   4 mg at 01/20/12 1101  . oxyCODONE (Oxy IR/ROXICODONE) immediate release tablet 10 mg  10 mg Oral Q6H PRN Vickie Epley, MD      . pantoprazole (PROTONIX) EC tablet 40 mg  40 mg Oral Q1200 Vickie Epley, MD      . prochlorperazine (COMPAZINE) tablet 10 mg  10 mg Oral Q6H PRN Vickie Epley, MD      . promethazine (PHENERGAN) injection 25 mg  25 mg Intravenous To Major Chauncy Passy, MD   25 mg at 01/20/12 1157  . promethazine (PHENERGAN) injection 25 mg  25 mg Intravenous Q4H PRN Vickie Epley, MD      . promethazine (PHENERGAN) tablet 25 mg  25 mg Oral Q4H PRN Vickie Epley, MD      . senna [facility]) tablet 17.2 mg  2 tablet Oral BID Vickie Epley, MD      . sodium chloride 0.9 % bolus 1,000 mL  1,000 mL Intravenous Once Chauncy Passy, MD   1,000 mL at 01/20/12 1104  . sucralfate (CARAFATE) tablet 1 g  1 g Oral TID WC & HS Vickie Epley, MD      . DISCONTD: morphine 4 MG/ML injection 4 mg  4 mg  Intravenous Once Chauncy Passy, MD      . DISCONTD: promethazine (PHENERGAN) injection 25 mg  25 mg Intravenous Q6H PRN Blanchie Dessert, MD   25 mg at 01/20/12 2043   Review Of Systems: Per HPI. Otherwise 12 point review of systems was performed and was unremarkable.  Physical Exam: Filed Vitals:   01/20/12 2232  BP: 130/81  Pulse: 74  Temp: 97.8 F (36.6 C)  Resp: 14   General: alert, cooperative, appears stated age and mild distress HEENT: PERRLA, extra ocular movement intact, sclera clear, anicteric, oropharynx clear, no lesions and neck supple with midline trachea Heart: S1, S2 normal, no murmur, rub or gallop, regular rate and rhythm Lungs: clear to auscultation, no wheezes or rales and unlabored breathing Abdomen: mild tenderness throughout, more in epigastrum.  No guarding or rebound.  When distracted, patient's abdominal pain is very mild at best Extremities: extremities normal, atraumatic, no cyanosis or edema Skin:no rashes, no ecchymoses Neurology: normal without focal findings, mental status, speech normal, alert and oriented x3 and PERLA  Labs and Imaging:  Results for orders placed during the hospital encounter of 01/20/12 (from the past 24 hour(s))  CBC     Status: Abnormal   Collection Time   01/20/12 11:29 AM      Component Value Range   WBC 7.8  4.0 - 10.5 (K/uL)   RBC 3.97  3.87 - 5.11 (MIL/uL)   Hemoglobin 12.3  12.0 - 15.0 (g/dL)   HCT 35.0 (*) 36.0 - 46.0 (%)   MCV 88.2  78.0 - 100.0 (fL)   MCH 31.0  26.0 - 34.0 (pg)   MCHC 35.1  30.0 - 36.0 (g/dL)   RDW 12.4  11.5 - 15.5 (%)   Platelets 249  150 - 400 (K/uL)  DIFFERENTIAL     Status: Normal   Collection Time   01/20/12 11:29 AM      Component Value Range   Neutrophils Relative 52  43 - 77 (%)   Neutro Abs 4.1  1.7 - 7.7 (K/uL)   Lymphocytes Relative 40  12 - 46 (%)   Lymphs Abs 3.1  0.7 - 4.0 (K/uL)   Monocytes Relative 6  3 - 12 (%)   Monocytes Absolute 0.4  0.1 - 1.0 (K/uL)   Eosinophils  Relative 2  0 - 5 (%)   Eosinophils Absolute 0.2  0.0 - 0.7 (K/uL)   Basophils Relative 0  0 - 1 (%)   Basophils Absolute 0.0  0.0 - 0.1 (K/uL)  COMPREHENSIVE METABOLIC PANEL     Status: Abnormal   Collection Time   01/20/12 11:29 AM      Component Value Range   Sodium 136  135 - 145 (mEq/L)   Potassium 4.2  3.5 - 5.1 (mEq/L)   Chloride 100  96 - 112 (mEq/L)   CO2 24  19 - 32 (mEq/L)   Glucose, Bld 367 (*) 70 - 99 (mg/dL)   BUN 21  6 - 23 (mg/dL)   Creatinine, Ser 1.84 (*) 0.50 - 1.10 (mg/dL)   Calcium 10.2  8.4 - 10.5 (mg/dL)   Total Protein 8.0  6.0 - 8.3 (g/dL)   Albumin 4.2  3.5 - 5.2 (g/dL)   AST 12  0 - 37 (U/L)   ALT 10  0 - 35 (U/L)   Alkaline Phosphatase 89  39 - 117 (U/L)   Total Bilirubin 0.3  0.3 - 1.2 (mg/dL)   GFR calc non Af Amer 32 (*) >  90 (mL/min)   GFR calc Af Amer 37 (*) >90 (mL/min)  LIPASE, BLOOD     Status: Abnormal   Collection Time   01/20/12 11:29 AM      Component Value Range   Lipase 114 (*) 11 - 59 (U/L)  LACTIC ACID, PLASMA     Status: Normal   Collection Time   01/20/12 11:29 AM      Component Value Range   Lactic Acid, Venous 1.1  0.5 - 2.2 (mmol/L)  URINALYSIS, ROUTINE W REFLEX MICROSCOPIC     Status: Abnormal   Collection Time   01/20/12 12:30 PM      Component Value Range   Color, Urine YELLOW  YELLOW    APPearance CLEAR  CLEAR    Specific Gravity, Urine 1.015  1.005 - 1.030    pH 7.5  5.0 - 8.0    Glucose, UA >1000 (*) NEGATIVE (mg/dL)   Hgb urine dipstick SMALL (*) NEGATIVE    Bilirubin Urine NEGATIVE  NEGATIVE    Ketones, ur NEGATIVE  NEGATIVE (mg/dL)   Protein, ur 30 (*) NEGATIVE (mg/dL)   Urobilinogen, UA 0.2  0.0 - 1.0 (mg/dL)   Nitrite NEGATIVE  NEGATIVE    Leukocytes, UA NEGATIVE  NEGATIVE   URINE MICROSCOPIC-ADD ON     Status: Abnormal   Collection Time   01/20/12 12:30 PM      Component Value Range   Squamous Epithelial / LPF FEW (*) RARE    RBC / HPF 0-2  <3 (RBC/hpf)  POCT I-STAT TROPONIN I     Status: Normal    Collection Time   01/20/12 12:52 PM      Component Value Range   Troponin i, poc 0.01  0.00 - 0.08 (ng/mL)   Comment 3           HEPATIC FUNCTION PANEL     Status: Normal   Collection Time   01/20/12  1:11 PM      Component Value Range   Total Protein 7.6  6.0 - 8.3 (g/dL)   Albumin 3.7  3.5 - 5.2 (g/dL)   AST 15  0 - 37 (U/L)   ALT <5  0 - 35 (U/L)   Alkaline Phosphatase 76  39 - 117 (U/L)   Total Bilirubin 0.3  0.3 - 1.2 (mg/dL)   Bilirubin, Direct <0.1  0.0 - 0.3 (mg/dL)   Indirect Bilirubin NOT CALCULATED  0.3 - 0.9 (mg/dL)  POCT I-STAT, CHEM 8     Status: Abnormal   Collection Time   01/20/12  1:16 PM      Component Value Range   Sodium 140  135 - 145 (mEq/L)   Potassium 4.5  3.5 - 5.1 (mEq/L)   Chloride 107  96 - 112 (mEq/L)   BUN 24 (*) 6 - 23 (mg/dL)   Creatinine, Ser 1.60 (*) 0.50 - 1.10 (mg/dL)   Glucose, Bld 345 (*) 70 - 99 (mg/dL)   Calcium, Ion 1.19  1.12 - 1.32 (mmol/L)   TCO2 25  0 - 100 (mmol/L)   Hemoglobin 12.9  12.0 - 15.0 (g/dL)   HCT 38.0  36.0 - 46.0 (%)  GLUCOSE, CAPILLARY     Status: Abnormal   Collection Time   01/20/12 10:26 PM      Component Value Range   Glucose-Capillary 170 (*) 70 - 99 (mg/dL)     Assessment and Plan: [name] [name] is a 46 y.o. year old female presenting with emesis and abdominal pain.  1. Cyclic vomiting: Known diagnosis.  Previous gastric emptying study did not show evidence of gastroparesis.  Pt has been seen by people at [facility] in the past but transportation issues have been keeping her from going in the recent past.  Due to repeated presentations we will admit, hydrate, and provide IV nausea control.  I would recommend consulting with social work to help with OP transportation to specialists as this will be important in keeping the patient out of the hospital in the future. 2. Diabetes: poorly controlled.  Will give SSI while patient not taking much oral intake, progressing to home regimen.  Will need close f/u as OP as  blood glucose control will also be important in preventing progression of her CKD. 3. CKD: At baseline.  Cont to monitor. 4. History of THC use: Check UDS.  This has contributed to the patient's presentation in past hospitalizations although the patient fiercly contests this. 5. HTN: Normotensive at this time.  Cont OP meds 6. FEN/GI: Clears 7. Prophylaxis: Protonix, SQH 8. Disposition: Pending toleration of oral intake  [name] [name] 01/21/2012, 12:00 AM

## 2012-01-20 NOTE — ED Notes (Signed)
Pt transported non tele to 5500.

## 2012-01-20 NOTE — ED Notes (Signed)
Patient states she is unable to void at this time.  

## 2012-01-20 NOTE — ED Notes (Signed)
Preparing pt for transport. Belongings bagged.

## 2012-01-20 NOTE — ED Notes (Signed)
Called [phone] to give report. Nurse passing meds; stated she would call back.

## 2012-01-20 NOTE — ED Notes (Signed)
Patient is resting comfortably. 

## 2012-01-20 NOTE — ED Notes (Signed)
Patient states the pain and vomiting started early this morning.

## 2012-01-20 NOTE — ED Notes (Signed)
IV team here to access port and draw labs

## 2012-01-21 DIAGNOSIS — E860 Dehydration: Secondary | ICD-10-CM | POA: Diagnosis not present

## 2012-01-21 DIAGNOSIS — E118 Type 2 diabetes mellitus with unspecified complications: Secondary | ICD-10-CM | POA: Diagnosis not present

## 2012-01-21 DIAGNOSIS — R112 Nausea with vomiting, unspecified: Secondary | ICD-10-CM | POA: Diagnosis not present

## 2012-01-21 DIAGNOSIS — K859 Acute pancreatitis without necrosis or infection, unspecified: Secondary | ICD-10-CM | POA: Diagnosis not present

## 2012-01-21 LAB — BASIC METABOLIC PANEL
CO2: 28 mEq/L (ref 19–32)
Calcium: 9.6 mg/dL (ref 8.4–10.5)
Chloride: 102 mEq/L (ref 96–112)
Potassium: 3.4 mEq/L — ABNORMAL LOW (ref 3.5–5.1)
Sodium: 138 mEq/L (ref 135–145)

## 2012-01-21 LAB — RAPID URINE DRUG SCREEN, HOSP PERFORMED
Barbiturates: POSITIVE — AB
Cocaine: NOT DETECTED
Opiates: POSITIVE — AB

## 2012-01-21 LAB — HEMOGLOBIN A1C
Hgb A1c MFr Bld: 12.2 % — ABNORMAL HIGH (ref <5.7)
Mean Plasma Glucose: 303 mg/dL — ABNORMAL HIGH (ref <117)

## 2012-01-21 LAB — GLUCOSE, CAPILLARY: Glucose-Capillary: 153 mg/dL — ABNORMAL HIGH (ref 70–99)

## 2012-01-21 MED ORDER — INSULIN ASPART 100 UNIT/ML ~~LOC~~ SOLN
0.0000 [IU] | Freq: Three times a day (TID) | SUBCUTANEOUS | Status: DC
  Administered 2012-01-21: 2 [IU] via SUBCUTANEOUS
  Administered 2012-01-22: 3 [IU] via SUBCUTANEOUS

## 2012-01-21 MED ORDER — SODIUM CHLORIDE 0.9 % IJ SOLN: 10.0000 mL | INTRAMUSCULAR | Status: DC | PRN

## 2012-01-21 MED ORDER — TRAMADOL HCL 50 MG PO TABS
50.0000 mg | ORAL_TABLET | Freq: Four times a day (QID) | ORAL | Status: DC | PRN
  Administered 2012-01-21 (×2): 50 mg via ORAL
  Filled 2012-01-21 (×2): qty 1

## 2012-01-21 MED ORDER — PROCHLORPERAZINE MALEATE 10 MG PO TABS: 10.0000 mg | ORAL_TABLET | Freq: Four times a day (QID) | ORAL | Status: DC | PRN

## 2012-01-21 MED ORDER — POTASSIUM CHLORIDE 20 MEQ/15ML (10%) PO LIQD
10.0000 meq | Freq: Once | ORAL | Status: AC
  Administered 2012-01-21: 10 meq via ORAL
  Filled 2012-01-21: qty 7.5

## 2012-01-21 NOTE — Discharge Summary (Signed)
Physician Discharge Summary  Patient ID: [name] [name] MRN: [id number] DOB: Sep 10, 1966 Age: 46 y.o.  Admit date: 01/20/2012 Discharge date: 01/22/2012  PCP: Marlana Salvage, MD, MD of [name]   Discharge Diagnosis: Principal Problem:  *Cyclic vomiting syndrome Active Problems:  DIABETES MELLITUS, II, COMPLICATIONS  DEPRESSION, MAJOR, RECURRENT  ANXIETY  TOBACCO DEPENDENCE  HYPERTENSION, BENIGN SYSTEMIC  GERD  CKD (chronic kidney disease), stage III  Marijuana smoker    Hospital Course [name] [name] is a 46 y.o. year old female with a history of known cyclic vomiting presenting with emesis and abdominal pain.  1. Cyclic vomiting: Known diagnosis.  1. Previous gastric emptying study  Showed moderately delayed gastric emptying but patient had been on narcotics. EGD with ulcerative esophagitis with patient on PPI. Lipase mildly elevated to 114, has been about 100 for 2 weeks.  2. Pt has been seen by GI specialists at [facility] in the past but transportation issues have been keeping her from going in the recent past.  1. SW consulted to help arrange transportation to [facility].  2. Could also consider [facility] care management team as frequent hospital utilization.  3. Hydrated patient overnight and provided nausea control. Patient requested advancement on diet of second day of admission and tolerated increasing amounts of PO.  1. Will add compazine to home regimen for nausea.  2. No further nausea for 24 hours prior to discharge, abdominal pain resolved. 2. Diabetes: a1c worsened to 12.2 with 9.8 2 months ago. poorly controlled. Controlled in house on sliding scale. Doubt patient is taking any of her medications at home. Needs to be readdressed outpatient.  3. CKD stage III: Slight increase to cr 1.87 at admission, improved to 1.6 which is baseline on second dayr. 4. History of THC use: Check UDS. Patient admits to marijuana use within the past month and not sustained  quitting as has been planned. SW for substance abuse.  5. HTN: intermittently high when in pain or vomiting. Cont OP meds. Improved when issues per #1 controlled.  6. Disposition:  Patient discharged to home on 01/22/12.   Procedures/Imaging:  Dg Chest 2 View  01/20/2012  *RADIOLOGY REPORT*  Clinical Data: Chest and abdominal pain.  Nausea vomiting. Hypertension.  CHEST - 2 VIEW  Comparison: 07/20/2011  Findings: Heart size is normal.  Both lungs are clear.  No evidence of pleural effusion.  No mass or lymphadenopathy identified.  Right- sided Port-A-Cath remains in appropriate position.  IMPRESSION: No active disease.  Original Report Authenticated By: Marlaine Hind, M.D.   Dg Abd Acute W/chest  01/17/2012  *RADIOLOGY REPORT*  Clinical Data: Epigastric pain  ACUTE ABDOMEN SERIES (ABDOMEN 2 VIEW & CHEST 1 VIEW)  Comparison: 12/05/2011  Findings: Cardiomediastinal silhouette is stable.  No acute infiltrate or pleural effusion. Mild thoracic dextroscoliosis. Right subclavian Port-A-Cath is unchanged in position.  There is nonspecific nonobstructive bowel gas pattern.  Post cholecystectomy surgical clips are noted.  Stool noted in proximal transverse colon. No free abdominal air.  IMPRESSION: No acute disease.  Nonspecific nonobstructive bowel gas pattern. No free abdominal air.  Post cholecystectomy surgical clips are noted.  Stool noted proximal transverse colon  Original Report Authenticated By: Lahoma Crocker, M.D.    Labs  CBC  Lab 01/20/12 1316 01/20/12 1129 01/17/12 1430  WBC -- 7.8 7.3  HGB 12.9 12.3 13.4  HCT 38.0 35.0* 39.1  PLT -- 249 255   BMET  Lab 01/21/12 0624 01/20/12 1316 01/20/12 1311 01/20/12 1129 01/17/12  1430  NA 138 140 -- 136 --  K 3.4* 4.5 -- 4.2 --  CL 102 107 -- 100 --  CO2 28 -- -- 24 24  BUN 14 24* -- 21 --  CREATININE 1.60* 1.60* -- 1.84* --  CALCIUM 9.6 -- -- 10.2 10.7*  PROT -- -- 7.6 8.0 --  BILITOT -- -- 0.3 0.3 --  ALKPHOS -- -- 76 89 --  ALT -- -- <5 10 --    AST -- -- 15 12 --  GLUCOSE 131* 345* -- 367* --   LACTIC ACID, PLASMA     Status: Normal   Collection Time   01/20/12 11:29 AM      Component Value Range Comment   Lactic Acid, Venous 1.1  0.5 - 2.2 (mmol/L)   HEMOGLOBIN A1C     Status: Abnormal   Collection Time   01/20/12 11:01 PM      Component Value Range Comment   Hemoglobin A1C 12.2 (*) <5.7 (%)    Mean Plasma Glucose 303 (*) <117 (mg/dL)   URINE RAPID DRUG SCREEN (HOSP PERFORMED)     Status: Abnormal   Collection Time   01/21/12 11:40 AM      Component Value Range Comment   Opiates POSITIVE (*) NONE DETECTED     Cocaine NONE DETECTED  NONE DETECTED     Benzodiazepines NONE DETECTED  NONE DETECTED     Amphetamines NONE DETECTED  NONE DETECTED     Tetrahydrocannabinol POSITIVE (*) NONE DETECTED     Barbiturates POSITIVE (*) NONE DETECTED         Patient condition at time of discharge/disposition: stable  Disposition-home  Discharge Exam:  BP 155/95  Pulse 65  Temp(Src) 97.8 F (36.6 C) (Oral)  Resp 20  Ht 5\' 1"  (1.549 m)  Wt 165 lb 12.6 oz (75.2 kg)  BMI 31.32 kg/m2  SpO2 97% General: alert, cooperative, up walking around room, no distress  HEENT: PERRLA, extra ocular movement intact, sclera clear, anicteric, oropharynx clear, no lesions and neck supple with midline trachea  Heart: S1, S2 normal, no murmur, rub or gallop, regular rate and rhythm  Lungs: clear to auscultation, no wheezes or rales and unlabored breathing  Abdomen: mild tenderness in epigastrum. No guarding or rebound. Patient can be distracted from pain.  Good bowel sounds throughout.  Extremities: extremities normal, atraumatic, no cyanosis or edema  Skin:no rashes, no ecchymoses  Neurology: normal without focal findings, mental status, speech normal, alert and oriented x3 and PERLA     Follow up issues: 1. Please make sure to have patient follow up with GI specialists at [facility]. She has been given medicaid transport information and should not  have issues with transportation if plans accordingly.  2. Please consider patient for the [facility] care management plan as she is a frequent utilizer of healthcare system with continued marijuana abuse. UDS positive marijuana.  3. Please consider tapering pain medications. Patient chronically seeks anrcotic medications for her abdomen and back pain. Please consider a query of narcotic records from state as well.  4. Should consider at least not providing narcotics until patient free of marijuana. She adamantly denied use at admission but admitted later 3x weekly usage.  5. Needs to have DM medications reviewed. Do not believe patient is taking medication as seen by recent a1c of 12.2.  6. Repeat BMET. IF continues to be CKD stage III or worse, consider renal consult.   Discharge follow up:  Follow-up Information  Follow up with Marlana Salvage, MD on 01/26/2012. (8:45 AM)    Contact information:   [place] [phone]         Discharge Orders    Future Appointments: Provider: Department: Dept Phone: Center:   01/26/2012 8:45 AM Marlana Salvage, MD [facility] [phone] [facility]     Future Orders Please Complete By Expires   Diet Carb Modified      Increase activity slowly      Call MD for:  persistant nausea and vomiting      Call MD for:  severe uncontrolled pain         Discharge Medications  [name] [name], [name] [name]  Home Medication Instructions [id number]   Printed on:01/21/12 1458  Medication Information                    aspirin 81 MG tablet Take 81 mg by mouth daily.             EPINEPHrine (EPIPEN) 0.3 MG/0.3ML DEVI One dose Im as needed for severe anaphylaxis.           sucralfate (CARAFATE) 1 G tablet Take 1 tablet (1 g total) by mouth 4 (four) times daily -  with meals and at bedtime.           carvedilol (COREG) 12.5 MG tablet Take 1 tablet (12.5 mg total) by mouth 2 (two) times daily with a meal.           insulin  glargine (LANTUS) 100 UNIT/ML injection Inject 10 Units into the skin every morning. Marland Kitchen           omeprazole (PRILOSEC) 20 MG capsule Take 2 capsules (40 mg total) by mouth daily.           senna (SENOKOT) 8.6 MG TABS Take 2 tablets (17.2 mg total) by mouth 2 (two) times daily. For constipation           LIDODERM 5 % Place 1 patch onto the skin daily as needed. For pain           LORazepam (ATIVAN) 1 MG tablet Take 1 mg by mouth every 4 (four) hours as needed. For anxiety            lisinopril (PRINIVIL,ZESTRIL) 20 MG tablet Take 20 mg by mouth daily.           cyclobenzaprine (FLEXERIL) 5 MG tablet Take 5 mg by mouth 3 (three) times daily as needed. For spasms           insulin aspart (NOVOLOG) 100 UNIT/ML injection Inject 6 Units into the skin 3 (three) times daily before meals.           nicotine (NICODERM CQ - DOSED IN MG/24 HR) 7 mg/24hr patch Place 1 patch onto the skin daily.           Syringe, Disposable, 2.5 ML MISC 1 Device by Does not apply route 3 (three) times daily as needed.           NEEDLE, DISP, 23 G 23G X 1-1/2" MISC 1 Device by Does not apply route 3 (three) times daily as needed.           Oxycodone HCl 10 MG TABS Take 10 mg by mouth every 6 (six) hours as needed. For pain           promethazine (PHENERGAN) 25 MG tablet Take 25 mg by mouth every 4 (four) hours as  needed. For nausea           promethazine (PHENERGAN) 25 MG suppository Place 1 suppository (25 mg total) rectally every 6 (six) hours as needed for nausea.           prochlorperazine (COMPAZINE) 10 MG tablet Take 1 tablet (10 mg total) by mouth every 6 (six) hours as needed (give after phenergan/zofran for nausea).                [name], MD of [name] 01/22/2012 9:21 AM

## 2012-01-21 NOTE — Progress Notes (Signed)
PGY-1 Daily Progress Note Family Medicine Teaching Service Brayton Mars. Melanee Spry, MD Service Pager: [phone]   Subjective: Pain 5/10 at admission and approximately half of that. Vomiting 6-10x yesterday. Vomiting x1 this morning and tolerating some liquids.   Patient really wants to go home as birthday tomorrow. Says she is a little anxious about it but thinks compazine would help her.   Objective:  Temp:  [97.1 F (36.2 C)-98.2 F (36.8 C)] 98.2 F (36.8 C) (01/25 0540) Pulse Rate:  [68-115] 73  (01/25 0540) Resp:  [11-18] 16  (01/25 0540) BP: (121-234)/(64-132) 159/78 mmHg (01/25 0540) SpO2:  [96 %-100 %] 96 % (01/25 0540) Weight:  [165 lb 12.6 oz (75.2 kg)] 165 lb 12.6 oz (75.2 kg) (01/24 2232) No intake or output data in the 24 hours ending 01/21/12 0758  General: alert, cooperative, up walking around room, no distress HEENT: PERRLA, extra ocular movement intact, sclera clear, anicteric, oropharynx clear, no lesions and neck supple with midline trachea  Heart: S1, S2 normal, no murmur, rub or gallop, regular rate and rhythm  Lungs: clear to auscultation, no wheezes or rales and unlabored breathing  Abdomen: mild tenderness in epigastrum. No guarding or rebound. Patient can be distracted from pain.   Extremities: extremities normal, atraumatic, no cyanosis or edema  Skin:no rashes, no ecchymoses  Neurology: normal without focal findings, mental status, speech normal, alert and oriented x3 and PERLA   Labs and imaging:   CBC  Lab 01/20/12 1316 01/20/12 1129 01/17/12 1430  WBC -- 7.8 7.3  HGB 12.9 12.3 13.4  HCT 38.0 35.0* 39.1  PLT -- 249 255   BMET  Lab 01/21/12 0624 01/20/12 1316 01/20/12 1129 01/17/12 1430  NA 138 140 136 --  K 3.4* 4.5 4.2 --  CL 102 107 100 --  CO2 28 -- 24 24  BUN 14 24* 21 --  CREATININE 1.60* 1.60* 1.84* --  LABGLOM -- -- -- --  GLUCOSE 131* -- -- --  CALCIUM 9.6 -- 10.2 10.7*    Dg Chest 2 View  01/20/2012  *RADIOLOGY REPORT*   Clinical Data: Chest and abdominal pain.  Nausea vomiting. Hypertension.  CHEST - 2 VIEW  Comparison: 07/20/2011  Findings: Heart size is normal.  Both lungs are clear.  No evidence of pleural effusion.  No mass or lymphadenopathy identified.  Right- sided Port-A-Cath remains in appropriate position.  IMPRESSION: No active disease.  Original Report Authenticated By: Marlaine Hind, M.D.     Assessment  [name] [name] is a 46 y.o. year old female with a history of known cyclic vomiting presenting with emesis and abdominal pain.   1. Cyclic vomiting: Known diagnosis.  1. Previous gastric emptying study did not show evidence of gastroparesis. EGD with ulcerative esophagitis with patient on PPI. Lipase mildly elevated to 114, has been about 100 for 2 weeks.  2. Pt has been seen by GI specialists at [facility] in the past but transportation issues have been keeping her from going in the recent past.  1. SW consult to help arrange transportation to [facility].  2. Could also consider [facility] care management team as frequent hospital utilization.  3. Hydrated patient overnight and provided nausea control.  1. Patient appears improved this morning and requesting to go home if she can have compazine. Her pharmacy has this available so would consider discharge 2. Diabetes: a1c pending. 9.8 2 months ago. poorly controlled. CBGs <170 since 10pm on SSI. Will need close f/u as OP as  blood glucose control will also be important in preventing progression of her CKD. 3. CKD stage III:  Slight increase to cr 1.87 at admission, now 1.6 at baseline.  Cont to monitor. 4. History of THC use: Check UDS. Patient admits to marijuana use within the past month and not sustained quitting as has been planned. SW for substance abuse.  5. HTN: intermittently high when in pain or vomiting.  Cont OP meds. Improved when issues per #1 controlled.  6. FEN/GI: Clears. Potassium 3.4 repleted with 3meq due to CKD.  7. Prophylaxis: Protonix,  SQH 8. Disposition: Possible discharge home today vs follow one more day.   Garret Reddish, MD PGY1, Family Medicine Teaching Service [phone]

## 2012-01-21 NOTE — H&P (Signed)
I examined [name] [name] in the emergency room and discussed her case with Dr Jess Barters and Dr Sheral Apley. Since her pain is more epigastric and her lipase is quite elevated, it may be that this is more pancreatitis than her usual cyclic vomiting. I agree with the care plan.

## 2012-01-21 NOTE — Progress Notes (Signed)
Clinical Education officer, museum (CSW) completed psychosocial assessment which can be found in pt shadow chart. CSW visited pt in the room and dicussed her transportation issues and concerns about her marijuana use. Pt stated she now has a car and will be able to drive to her medical appointments. CSW also discussed concerns of pt marijuana use and explored whether pt is interested in seeking substance abuse counseling. Pt stated she "smokes weed" 3x a week to relieve the pain and in order to get an appetite. Pt stated she is interested in quitting however believes "it takes time." CSW informed pt about the concerns associated with her using drugs and validated pt feelings of frustration with illness and need to use. CSW provided pt with a list of substance abuse facilities and encouraged pt to seek treatment. Pt stated she would consider treatment options and appreciate CSW visit.   Hunt Oris, MSW, [place]
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
## 2012-01-22 DIAGNOSIS — K859 Acute pancreatitis without necrosis or infection, unspecified: Secondary | ICD-10-CM | POA: Diagnosis not present

## 2012-01-22 DIAGNOSIS — E860 Dehydration: Secondary | ICD-10-CM | POA: Diagnosis not present

## 2012-01-22 DIAGNOSIS — R112 Nausea with vomiting, unspecified: Secondary | ICD-10-CM | POA: Diagnosis not present

## 2012-01-22 DIAGNOSIS — E118 Type 2 diabetes mellitus with unspecified complications: Secondary | ICD-10-CM | POA: Diagnosis not present

## 2012-01-22 LAB — GLUCOSE, CAPILLARY: Glucose-Capillary: 172 mg/dL — ABNORMAL HIGH (ref 70–99)

## 2012-01-22 MED ORDER — ALTEPLASE 2 MG IJ SOLR
2.0000 mg | Freq: Once | INTRAMUSCULAR | Status: AC
  Administered 2012-01-22: 2 mg
  Filled 2012-01-22: qty 2

## 2012-01-22 NOTE — Discharge Summary (Signed)
FMTS Attending Note Patient seen in room; she is alert, does not look up from game on her smart phone device. Appears in no apparent distress, expresses interest in going home today.   I agree with plan for discharge home; to attempt to secure additional resources to remove barriers to her ability to keep subspecialty appointments.  Will discuss ways to structure a more comprehensive plan for medication management in order to minimize frequency of visits to EDs, etc.  Dalbert Mayotte, M.D.

## 2012-01-22 NOTE — Progress Notes (Signed)
Pt given AVS discharge instructions, new prescriptions sent to pharmacy and follow up appointments. Reviewed GI notes with pt. Pt went home with central line. Skin intact. All questions answered. Pt ambulated off floor for home. Car in parking deck.  Devoria Albe RN

## 2012-01-26 ENCOUNTER — Encounter: Payer: Self-pay | Admitting: Family Medicine

## 2012-01-26 ENCOUNTER — Ambulatory Visit (INDEPENDENT_AMBULATORY_CARE_PROVIDER_SITE_OTHER): Payer: Medicare Other | Admitting: Family Medicine

## 2012-01-26 VITALS — BP 159/82 | HR 71 | Temp 98.3°F | Ht 61.0 in | Wt 172.0 lb

## 2012-01-26 DIAGNOSIS — G43909 Migraine, unspecified, not intractable, without status migrainosus: Secondary | ICD-10-CM

## 2012-01-26 DIAGNOSIS — I10 Essential (primary) hypertension: Secondary | ICD-10-CM

## 2012-01-26 DIAGNOSIS — F339 Major depressive disorder, recurrent, unspecified: Secondary | ICD-10-CM | POA: Diagnosis not present

## 2012-01-26 MED ORDER — CARVEDILOL 25 MG PO TABS
25.0000 mg | ORAL_TABLET | Freq: Two times a day (BID) | ORAL | Status: DC
Start: 2012-01-26 — End: 2012-08-17

## 2012-01-26 NOTE — Progress Notes (Signed)
  Subjective:    Patient ID: [name] [name], female    DOB: 1966-01-08, 46 y.o.   MRN: [id number]  HPI Patient history for hospital followup. Patient was hospitalized for cyclic vomiting. Today she complains of a headache since last night. She states that these occur infrequently. Usually she'll have one when she is in the cyclic vomiting episode. She has not take Tylenol because it makes her feel sick. She is avoiding NSAIDs due to her kidney function. She denies any photophobia. These are usually on the top of the head and this feels like her usual headache.  Hypertension-patient denies shortness of breath or chest pain today. She states that she takes her medications as prescribed.  Depression-patient is open to the idea of therapy. We discussed this multiple times before. She does not think she's ever been given the information to contact therapist in the past. She reports feeling slightly better mood today and talked about getting ready to restart school and getting her life back in order.   Review of Systems Denies SI HI    Objective:   Physical Exam Vital signs reviewed General appearance - alert, well appearing, and in no distress and oriented to person, place, and time Heart - normal rate, regular rhythm, normal S1, S2, no murmurs, rubs, clicks or gallops Chest - clear to auscultation, no wheezes, rales or rhonchi, symmetric air entry, no tachypnea, retractions or cyanosis Psych-not tearful today. Good eye contact. Well groomed. Apparently normal thought processes.       Assessment & Plan:

## 2012-01-26 NOTE — Patient Instructions (Signed)
He are the numbers for family services  Group Counseling [phone] Individual & Family Counseling [phone]  You could also meet with our psychologist, Dr. Zella Ball, for help dealing with your depression.  You can schedule an appointment with her by calling her directly at [phone].   For your blood pressure, please take 2 tablets of Coreg twice a day and come back in one week for a blood pressure check. I have sent in a new prescription for 25 mg of Coreg twice a day.

## 2012-01-26 NOTE — Assessment & Plan Note (Signed)
Gave number for family services as well as Dr. Gwenlyn Saran today. Plan to see patient back in one week.

## 2012-01-26 NOTE — Assessment & Plan Note (Signed)
Will increase coreg to 25mg  BID and see back in one week for recheck

## 2012-01-26 NOTE — Assessment & Plan Note (Signed)
Reports infrequent headaches but complains of one today. We'll monitor next visit in one week. Will ask patient to start a headache diary.

## 2012-02-04 ENCOUNTER — Ambulatory Visit (INDEPENDENT_AMBULATORY_CARE_PROVIDER_SITE_OTHER): Payer: Medicare Other | Admitting: Pharmacist

## 2012-02-04 ENCOUNTER — Encounter: Payer: Self-pay | Admitting: Pharmacist

## 2012-02-04 DIAGNOSIS — E1165 Type 2 diabetes mellitus with hyperglycemia: Secondary | ICD-10-CM | POA: Diagnosis not present

## 2012-02-04 DIAGNOSIS — E118 Type 2 diabetes mellitus with unspecified complications: Secondary | ICD-10-CM | POA: Diagnosis not present

## 2012-02-04 DIAGNOSIS — F17200 Nicotine dependence, unspecified, uncomplicated: Secondary | ICD-10-CM

## 2012-02-04 NOTE — Progress Notes (Signed)
  Subjective:    Patient ID: [name] [name], female    DOB: March 13, 1966, 46 y.o.   MRN: [id number]  HPI Reviewed and agree with Dr. Graylin Shiver management    Review of Systems     Objective:   Physical Exam        Assessment & Plan:

## 2012-02-04 NOTE — Progress Notes (Signed)
  Subjective:    Patient ID: [name] [name], female    DOB: March 01, 1966, 46 y.o.   MRN: [id number]  HPI [name] [name] is a 46yoF that presented to the pharmacy clinic to discuss diabetes management and smoking cessation. She reports that she has cut down on smoking cigarettes to 3x/day (from about 1/3 ppd) as well as smoking marijuana only 2-3x/week. She is anxious about gaining weight after quitting smoking but uses the marijuana because it keeps her calm and stimulates her appetite. She reports that often she will feel hungry but cannot get the desire/will to eat. She was upset about her health/cyclc vomiting and wishes she could be "normal."    Review of Systems     Objective:   Physical Exam Filed Vitals:   02/04/12 1022  BP: 147/86  Pulse: 73  Temp: 98.5 F (36.9 C)   Filed Vitals:   02/04/12 1022  Height: 5' 1.5" (1.562 m)  Weight: 173 lb 9.6 oz (78.744 kg)       Assessment & Plan:    Diabetes of many yrs duration currently under poor control of blood glucose based on   Lab Results  Component Value Date   HGBA1C 12.2* 01/20/2012    ,home fasting CBG readings of 186-216. Control is suboptimal due to variable food intake and erratic exercise. Reports hypoglycemia approximately 2x/week. Able to verbalize appropriate hypoglycemia management plan. Continued basal insulin Lantus (insulin glargine) 10 units daily. Adjusted dose of rapid insulin Novolog (insulin aspart) to 4 units for pre-meal CBG<150, 6 units for CBG 150-200, and 8 units for CBG >200.  Written patient instructions provided.   Total time in face to face counseling 45 minutes.  mild Nicotine Dependence of >30 years duration in a patient who is fair candidate for success b/c of progress with tapering cigarettes and motivating factors for quitting.  Patient identified quit date of 02/22/12. At that time she will plan for complete abstinence of both cigarettes and marijuana. Until that date she will attempt to not smoke more  than 2 cigarettes/day and keep indulging in marijuana to the last few family celebrations this month. Written information provided. Total time in face-to-face counseling 45 minutes.   Follow up in  Pharmacist Clinic Visit 02/29/12 at [place].  Patient seen with Woodroe Chen, PharmD

## 2012-02-04 NOTE — Patient Instructions (Addendum)
It was good to see you!  1) Start checking your blood sugar twice a day 2) Continue lantus 10 units each AM,   AND change your Novolog to 4 units if pre-meal blood glucose is 150mg  ,  6 units if 150-200 AND 8 units if your blood sugar is > 200 3) Try to slowly try to increase your exercise as you can. This can include walking outside, walking around the house during TV commercials, lifting light weights, etc. Find something that you like to do. 4) Between now and your quit date (your sister's birthday! 02/22/12), try not to smoke more than 2 cigarettes per day.   Follow-up with Dr. [name] in the pharmacy clinic a couple days after your quit date

## 2012-02-04 NOTE — Assessment & Plan Note (Signed)
Diabetes of many yrs duration currently under poor control of blood glucose based on   Lab Results  Component Value Date   HGBA1C 12.2* 01/20/2012    ,home fasting CBG readings of 186-216. Control is suboptimal due to variable food intake and erratic exercise. Reports hypoglycemia approximately 2x/week. Able to verbalize appropriate hypoglycemia management plan. Continued basal insulin Lantus (insulin glargine) 10 units daily. Adjusted dose of rapid insulin Novolog (insulin aspart) to 4 units for pre-meal CBG<150, 6 units for CBG 150-200, and 8 units for CBG >200.  Written patient instructions provided.   Total time in face to face counseling 45 minutes. Follow up in  Pharmacist Clinic Visit 02/29/12 at [place].  Patient seen with Woodroe Chen, PharmD

## 2012-02-04 NOTE — Assessment & Plan Note (Signed)
mild Nicotine Dependence of >30 years duration in a patient who is fair candidate for success b/c of progress with tapering cigarettes and motivating factors for quitting.  Patient identified quit date of 02/22/12. At that time she will plan for complete abstinence of both cigarettes and marijuana. Until that date she will attempt to not smoke more than 2 cigarettes/day and keep indulging in marijuana to the last few family celebrations this month. Written information provided. Total time in face-to-face counseling 45 minutes. Follow up in  Pharmacist Clinic Visit 02/29/12 at [place].  Patient seen with Woodroe Chen, PharmD

## 2012-02-10 ENCOUNTER — Telehealth: Payer: Self-pay | Admitting: Family Medicine

## 2012-02-10 NOTE — Telephone Encounter (Signed)
Pt advised that per our new policy pts must have appts to get a refill on pain meds, but that there were few exceptions.  Would forward to MD to get her response.  Of note, pt did not mention the chest pain. Saskia Simerson, [name]

## 2012-02-10 NOTE — Telephone Encounter (Signed)
Patient is calling because she needs a refill on her Oxycodone.  She also has questions about the chest pain she is experieincing.

## 2012-02-11 NOTE — Telephone Encounter (Signed)
Pt called again and she has made an appt for Monday.  She thinks the chest pain was from gas as it has subsided now.

## 2012-02-11 NOTE — Telephone Encounter (Signed)
Pt must come in for refills.  If she has chest pain, she needs to be evaluated.  Thanks!

## 2012-02-14 ENCOUNTER — Encounter: Payer: Self-pay | Admitting: Family Medicine

## 2012-02-14 ENCOUNTER — Ambulatory Visit (INDEPENDENT_AMBULATORY_CARE_PROVIDER_SITE_OTHER): Payer: Medicare Other | Admitting: Family Medicine

## 2012-02-14 VITALS — BP 151/75 | HR 75 | Temp 98.0°F | Ht 61.5 in | Wt 179.0 lb

## 2012-02-14 DIAGNOSIS — E118 Type 2 diabetes mellitus with unspecified complications: Secondary | ICD-10-CM

## 2012-02-14 DIAGNOSIS — M549 Dorsalgia, unspecified: Secondary | ICD-10-CM

## 2012-02-14 DIAGNOSIS — I10 Essential (primary) hypertension: Secondary | ICD-10-CM

## 2012-02-14 DIAGNOSIS — E1165 Type 2 diabetes mellitus with hyperglycemia: Secondary | ICD-10-CM | POA: Diagnosis not present

## 2012-02-14 DIAGNOSIS — R1115 Cyclical vomiting syndrome unrelated to migraine: Secondary | ICD-10-CM | POA: Diagnosis not present

## 2012-02-14 LAB — COMPREHENSIVE METABOLIC PANEL
ALT: 8 U/L (ref 0–35)
CO2: 21 mEq/L (ref 19–32)
Calcium: 9.3 mg/dL (ref 8.4–10.5)
Chloride: 104 mEq/L (ref 96–112)
Sodium: 136 mEq/L (ref 135–145)
Total Protein: 6.8 g/dL (ref 6.0–8.3)

## 2012-02-14 LAB — LDL CHOLESTEROL, DIRECT: Direct LDL: 102 mg/dL — ABNORMAL HIGH

## 2012-02-14 MED ORDER — LISINOPRIL 40 MG PO TABS: 40.0000 mg | ORAL_TABLET | Freq: Every day | ORAL | Status: DC

## 2012-02-14 MED ORDER — OXYCODONE HCL 10 MG PO TABS: 10.0000 mg | ORAL_TABLET | Freq: Four times a day (QID) | ORAL | Status: DC | PRN

## 2012-02-14 NOTE — Progress Notes (Signed)
  Subjective:    Patient ID: [name] [name], female    DOB: 1966-06-18, 46 y.o.   MRN: [id number]  HPI  Diabetes-patient is seeing Dr. [name] for help with insulin management. She's not having any lows on her new regimen. Feet examined today. Patient plans to see her eye doctor Dr. Gershon Crane in one month.  Hypertension-patient was confused and started twice daily dosing of lisinopril instead of increasing her Coreg. She denies any headaches, lightheadedness. She's not checking her blood pressures at home. She denies shortness of breath, chest pain.  Chronic pain-patient received oxycodone as part of her regimen to prevent hospitalizations for her cyclic vomiting. This has worked reasonably well for her. She's not had an admission in one month. She also uses the oxycodone for her chronic lower back pain. She states that she is use Vicodin but Vicodin was stopped to decrease the number of narcotics that she had. She uses the oxycodone with good relief of back pain and is able to do her chores at home without too much difficulty. She takes one to 2 oxycodone per day depending on the state of her cyclic vomiting.  Patient smokes with tobacco and marijuana. Her quit date is the 26th of this month. She is working on that with Dr. [name] Review of Systems See above    Objective:   Physical Exam Vital signs reviewed General appearance - alert, well appearing, and in no distress and oriented to person, place, and time Heart - normal rate, regular rhythm, normal S1, S2, no murmurs, rubs, clicks or gallops Chest - clear to auscultation, no wheezes, rales or rhonchi, symmetric air entry, no tachypnea, retractions or cyanosis Abdomen - soft, nontender, nondistended, no masses or organomegaly See foot exam       Assessment & Plan:

## 2012-02-14 NOTE — Assessment & Plan Note (Signed)
BP Readings from Last 3 Encounters:  02/14/12 151/75  02/04/12 147/86  01/26/12 159/82   BP improved but not at goal.  Pt increased her lisinopril to 40mg  q day instead of the coreg.  Will increase coreg as planned today.  Continue 40mg  lisinopril.

## 2012-02-14 NOTE — Assessment & Plan Note (Signed)
Patient to see her eye doctor in one month. Seeing Dr. [name] for help with insulin management. Check direct LDL today.

## 2012-02-14 NOTE — Assessment & Plan Note (Signed)
Out of hospital for one month.  Taking oxycodone, ativan, phenergan, zofran when she does have an episode.

## 2012-02-14 NOTE — Patient Instructions (Signed)
Today I increased your lisinopril to 40 mg. I sent in a new prescription for this to [facility] on [address]  you should also take the Coreg 25 mg twice a day Come back to see the nurse for a blood pressure check in one week. See me again after you see Dr. [name]

## 2012-02-14 NOTE — Assessment & Plan Note (Signed)
Refilled oxycodone today. The oxycodone is to be used for intermittent chronic back pain but mostly for her cyclic vomitting episodes. This helps keep her out of the hospital with these episodes.

## 2012-02-21 ENCOUNTER — Ambulatory Visit (INDEPENDENT_AMBULATORY_CARE_PROVIDER_SITE_OTHER): Payer: Medicare Other | Admitting: *Deleted

## 2012-02-21 VITALS — BP 170/82 | HR 60

## 2012-02-21 DIAGNOSIS — I10 Essential (primary) hypertension: Secondary | ICD-10-CM

## 2012-02-21 NOTE — Progress Notes (Signed)
Patient in office for BP check. BP checked manually using regular adult cuff.    BP LA 150/82 and RA 170/82 pulse 60. Patient had understood to take the Coreg once daily and take lisinopril twice daily so this is how she has been taking for past week. Explained that actually MD wants her to take the Coreg twice daily and Lisinopril  once daily. Consulted with Dr. Walker Kehr and he advised to forward this message to MD.   Patient  Voices understanding now of how she is to take meds.   Continues to have cramps in legs, back, hands.   She understood she was to have appointment with Dr. [name]  about something regarding checking pressure all over her body. She did have appointment but it was regarding diabetes. Will forward message to MD to ask about this.   Call back at numbers provided in chart.

## 2012-02-22 NOTE — Progress Notes (Signed)
Pt to have ABIs done in pharmacy clinic.  To take BP meds as prescribed.

## 2012-02-23 NOTE — Progress Notes (Signed)
Appointment scheduled for ABI

## 2012-02-29 ENCOUNTER — Encounter: Payer: Self-pay | Admitting: Pharmacist

## 2012-02-29 ENCOUNTER — Other Ambulatory Visit: Payer: Self-pay | Admitting: Pharmacist

## 2012-02-29 ENCOUNTER — Ambulatory Visit (INDEPENDENT_AMBULATORY_CARE_PROVIDER_SITE_OTHER): Payer: Medicare Other | Admitting: Pharmacist

## 2012-02-29 DIAGNOSIS — R1115 Cyclical vomiting syndrome unrelated to migraine: Secondary | ICD-10-CM

## 2012-02-29 DIAGNOSIS — F17200 Nicotine dependence, unspecified, uncomplicated: Secondary | ICD-10-CM | POA: Diagnosis not present

## 2012-02-29 DIAGNOSIS — E118 Type 2 diabetes mellitus with unspecified complications: Secondary | ICD-10-CM | POA: Diagnosis not present

## 2012-02-29 DIAGNOSIS — E1165 Type 2 diabetes mellitus with hyperglycemia: Secondary | ICD-10-CM | POA: Diagnosis not present

## 2012-02-29 MED ORDER — ACCU-CHEK FASTCLIX LANCETS MISC: 1.0000 | Freq: Once | Status: DC

## 2012-02-29 MED ORDER — PROMETHAZINE HCL 25 MG PO TABS: 25.0000 mg | ORAL_TABLET | ORAL | Status: DC | PRN

## 2012-02-29 MED ORDER — ACCU-CHEK AVIVA PLUS W/DEVICE KIT: 1.0000 | PACK | Freq: Once | Status: DC

## 2012-02-29 MED ORDER — "NEEDLE (DISP) 23G X 1-1/2"" MISC": 1.0000 | Freq: Three times a day (TID) | Status: DC | PRN

## 2012-02-29 NOTE — Progress Notes (Signed)
Addended by: Leavy Cella on: 02/29/2012 10:40 AM   Modules accepted: Orders

## 2012-02-29 NOTE — Assessment & Plan Note (Signed)
Patient doing well.   Does NOT have any promethazine oral AND also needs needles for injection of promethazine.   Sent in refills to Pharmacy.

## 2012-02-29 NOTE — Progress Notes (Signed)
  Subjective:    Patient ID: [name] [name], female    DOB: 26-Apr-1966, 46 y.o.   MRN: [id number]  HPI Patient arrives in good spirits. Was able to quit smoking cigarettes at goal quit date of 02/21/11 w/o using nicotine patches.  Still smoking marijuana d/t increased stress with daughter moving in with her. Patient expresses desire to quit and set date of 03/05/11 after daughter moves out. Denies any upcoming social events that will interfere with her marijuana quit attempt. Has not checked BG x 1 month d/t not having matching meter/strips. Has been basing SSI novolog based on meal size - typically takes 4 units with early meal and 8 units with later meal.  Lantus continues to be taken at 10 units QAM.  Has not had vomiting episode x 7 weeks  Expresses desire to cut back on the number of medications she is taking.  Reports having allergy symptoms of burning eyes, sneezing and coughing that get progressively worse throughout the day.    Review of Systems     Objective:   Physical Exam        Assessment & Plan:   mild Nicotine Dependence of many  years duration in a patient who has recently quit on 02/22/2012.  She denied use of the nicotine patch.   Encouraged continued abstinence.      Written information provided. Still using marijuana, patient is willing to set quit date for 03/04/2012 (this Saturday) after her daughter leaves the house on Thursday.      Diabetes of many yrs duration currently under improved control of blood glucose based on  . Lab Results  Component Value Date   HGBA1C 12.2* 01/20/2012    Patient reported symptoms of hyper and hypoglycemia.  She has been out testing supplies.  Unable to assess level of  hypoglycemic events due to lack of meter results.   Patient does report having symptoms of low blood sugar.   Able to verbalize appropriate hypoglycemia management plan. Continued basal insulin Lantus (insulin glargine) 10 units daily and taking sliding scale of Novolog  as previously instructed when she gets new meter (voucher provided for ACCU- Check aviva Plus)   Written patient instructions provided.  Follow up with Dr. Charlett Blake next week and in  Pharmacist Clinic Visit on 3/19.   Total time in face to face counseling 45 minutes.  Patient seen with Liane Comber, PharmD Candidate and [place] Smith, Pharmacy Resident.

## 2012-02-29 NOTE — Patient Instructions (Signed)
Test BG prior to meals x 1 week. Fasting Goal: 80-120. <80 instructed to call clinic. Post prandial goal < 180. Instructed to call if consistently > 200 to reassess insulin dose. Bring meter and BG log to next visit Patient will call in 1 week and schedule appt if BG > 200 If BG routinely < 200 Call in 3-4 week and schedule appointment  or to discuss quit attempt, DM management, and possibly start tapering omeprazole

## 2012-02-29 NOTE — Assessment & Plan Note (Signed)
mild Nicotine Dependence of many  years duration in a patient who has recently quit on 02/22/2012.  She denied use of the nicotine patch.   Encouraged continued abstinence.      Written information provided. Still using marijuana, patient is willing to set quit date for 03/04/2012 (this Saturday) after her daughter leaves the house on Thursday.

## 2012-02-29 NOTE — Assessment & Plan Note (Addendum)
Diabetes of many yrs duration currently under improved control of blood glucose based on  . Lab Results  Component Value Date   HGBA1C 12.2* 01/20/2012    Patient reported symptoms of hyper and hypoglycemia.  She has been out testing supplies.  Unable to assess level of  hypoglycemic events due to lack of meter results.   Patient does report having symptoms of low blood sugar.   Able to verbalize appropriate hypoglycemia management plan. Continued basal insulin Lantus (insulin glargine) 10 units daily and taking sliding scale of Novolog as previously instructed when she gets new meter (voucher provided for ACCU- Check aviva Plus)   Written patient instructions provided.   AccuChek Aviva Plus voucher provided and sent supplies including lancet requests to Pharmacy.   Follow up with Dr. Charlett Blake next week and in  Pharmacist Clinic Visit on 3/19.   Total time in face to face counseling 45 minutes.  Patient seen with Liane Comber, PharmD Candidate and Martinique Smith, Pharmacy Resident.

## 2012-02-29 NOTE — Progress Notes (Signed)
  Subjective:    Patient ID: [name] [name], female    DOB: 08/18/1966, 46 y.o.   MRN: [id number]  HPI Reviewed and agree with Dr. Graylin Shiver management.    Review of Systems     Objective:   Physical Exam        Assessment & Plan:

## 2012-03-02 DIAGNOSIS — E119 Type 2 diabetes mellitus without complications: Secondary | ICD-10-CM | POA: Diagnosis not present

## 2012-03-02 DIAGNOSIS — Z961 Presence of intraocular lens: Secondary | ICD-10-CM | POA: Diagnosis not present

## 2012-03-02 DIAGNOSIS — Z794 Long term (current) use of insulin: Secondary | ICD-10-CM | POA: Diagnosis not present

## 2012-03-08 ENCOUNTER — Encounter: Payer: Self-pay | Admitting: Family Medicine

## 2012-03-08 ENCOUNTER — Ambulatory Visit (INDEPENDENT_AMBULATORY_CARE_PROVIDER_SITE_OTHER): Payer: Medicare Other | Admitting: Family Medicine

## 2012-03-08 VITALS — BP 145/71 | HR 69 | Temp 98.1°F | Wt 176.0 lb

## 2012-03-08 DIAGNOSIS — K921 Melena: Secondary | ICD-10-CM | POA: Diagnosis not present

## 2012-03-08 DIAGNOSIS — E118 Type 2 diabetes mellitus with unspecified complications: Secondary | ICD-10-CM

## 2012-03-08 DIAGNOSIS — E1165 Type 2 diabetes mellitus with hyperglycemia: Secondary | ICD-10-CM | POA: Diagnosis not present

## 2012-03-08 DIAGNOSIS — M25511 Pain in right shoulder: Secondary | ICD-10-CM | POA: Insufficient documentation

## 2012-03-08 DIAGNOSIS — M25519 Pain in unspecified shoulder: Secondary | ICD-10-CM | POA: Diagnosis not present

## 2012-03-08 MED ORDER — ACCU-CHEK FASTCLIX LANCETS MISC: 1.0000 | Freq: Once | Status: DC

## 2012-03-08 NOTE — Patient Instructions (Addendum)
Congratulations for quitting smoking! Keep up the great work Please come back and see [place] at the end of April for your diabetes check  Please come back if you have any further concerns about your stools

## 2012-03-08 NOTE — Assessment & Plan Note (Signed)
Patient concerned because saw red in toilet bowl. She has not had any changes in her bowel movements, abdominal pain, fever, weight loss. She is not constipated. She has had hemorrhoids in the past but does not think she is having trouble now. No blood in fecal occult blood test. Reviewed red flags with patient. She is to return if she sees any further red in the toilet.

## 2012-03-08 NOTE — Progress Notes (Signed)
  Subjective:    Patient ID: [name] [name], female    DOB: 08/14/1966, 46 y.o.   MRN: [id number]  HPI  Shoulder-patient with right shoulder pain x1 year. She says is related to a we injury. She is a constant ache in the shoulder but has not complained about this before. She states that she may be reinjured it about 8 months ago. She tries not to move very much. She would not want any surgery or injections in the shoulder.  Blood in stool-patient saw red in the toilet bowl this morning. She also had red on the toilet paper. This is the first time this happened. She is not constipated. She's had no changes in her stool. She has had no weight loss, fever, abdominal pain. She had 2 cousins who died of colon cancer and so this is very concerning for her. She has had a history of hemorrhoids but does not think she has anything active right now. She's been drinking a lot of Hawaiian punch. Patient stopped smoking on March 9 Review of Systems See above    Objective:   Physical Exam Vital signs reviewed General appearance - alert, well appearing, and in no distress and oriented to person, place, and time Heart - normal rate, regular rhythm, normal S1, S2, no murmurs, rubs, clicks or gallops Chest - clear to auscultation, no wheezes, rales or rhonchi, symmetric air entry, no tachypnea, retractions or cyanosis Shoulder-patient refuses thorough exam. She points to the area of the acromioclavicular joint as the most tender. She also has pain with empty can test. She refuses to do full range of motion with passive or active. No swelling seems to present. Rectal-there is a rectal skin tag present. No hemorrhage seen. No skin tear present. Fecal occult blood negative       Assessment & Plan:

## 2012-03-08 NOTE — Assessment & Plan Note (Signed)
Chronic shoulder pain on the right. Patient is not moving her shoulder much and I am concerned that she will get a frozen shoulder. She is refusing other exam today and she'll not let me passively move her shoulder very much or try the tests. I will send physical therapy for evaluation.

## 2012-03-09 ENCOUNTER — Other Ambulatory Visit: Payer: Self-pay | Admitting: Family Medicine

## 2012-03-09 ENCOUNTER — Telehealth: Payer: Self-pay | Admitting: Family Medicine

## 2012-03-09 DIAGNOSIS — M549 Dorsalgia, unspecified: Secondary | ICD-10-CM

## 2012-03-09 MED ORDER — OXYCODONE HCL 10 MG PO TABS: 10.0000 mg | ORAL_TABLET | Freq: Two times a day (BID) | ORAL | Status: DC | PRN

## 2012-03-09 NOTE — Telephone Encounter (Signed)
Patient is calling because she forgot to ask for a Rs for her Oxycodone.  She said she isn't due to have it filled until Sunday so she was hoping to be able to pick up the Rx before the weekend.

## 2012-03-09 NOTE — Telephone Encounter (Signed)
Filled 2 months.  Give to front desk to call

## 2012-03-10 ENCOUNTER — Telehealth: Payer: Self-pay | Admitting: Family Medicine

## 2012-03-10 NOTE — Telephone Encounter (Signed)
Patient would like to speak with Dr. Charlett Blake about trying Voltaren for her shoulder.  Her pharmacist suggested she speak with her MD about this.

## 2012-03-13 NOTE — Telephone Encounter (Signed)
Can you find out if she means voltaren gel or pills.  I will write an rx for the gel but she should not take the pills.

## 2012-03-13 NOTE — Telephone Encounter (Signed)
LMOVM asking pt to return call.  Admin please find out if she means the gel? Erby Sanderson, [name]

## 2012-03-14 ENCOUNTER — Ambulatory Visit: Payer: Medicare Other | Admitting: Pharmacist

## 2012-03-16 NOTE — Telephone Encounter (Signed)
Pt called back and wants the gel [facility] [address]

## 2012-03-17 MED ORDER — DICLOFENAC SODIUM 1 % TD GEL: 1.0000 "application " | Freq: Four times a day (QID) | TRANSDERMAL | Status: DC

## 2012-03-17 NOTE — Telephone Encounter (Signed)
LMOVM informing pt...............................Loukas Antonson, CMA  

## 2012-03-17 NOTE — Telephone Encounter (Signed)
Sent in voltaren gel for shoulder

## 2012-04-04 ENCOUNTER — Inpatient Hospital Stay (HOSPITAL_COMMUNITY)
Admission: EM | Admit: 2012-04-04 | Discharge: 2012-04-07 | DRG: 690 | Disposition: A | Discharge status: Home / Self Care | Payer: Medicare Other | Attending: Family Medicine | Admitting: Family Medicine

## 2012-04-04 ENCOUNTER — Encounter (HOSPITAL_COMMUNITY): Payer: Self-pay | Admitting: Emergency Medicine

## 2012-04-04 DIAGNOSIS — N12 Tubulo-interstitial nephritis, not specified as acute or chronic: Secondary | ICD-10-CM | POA: Diagnosis not present

## 2012-04-04 DIAGNOSIS — R1115 Cyclical vomiting syndrome unrelated to migraine: Secondary | ICD-10-CM | POA: Diagnosis not present

## 2012-04-04 DIAGNOSIS — E1149 Type 2 diabetes mellitus with other diabetic neurological complication: Secondary | ICD-10-CM | POA: Diagnosis present

## 2012-04-04 DIAGNOSIS — K219 Gastro-esophageal reflux disease without esophagitis: Secondary | ICD-10-CM | POA: Diagnosis present

## 2012-04-04 DIAGNOSIS — Z794 Long term (current) use of insulin: Secondary | ICD-10-CM | POA: Diagnosis not present

## 2012-04-04 DIAGNOSIS — Z79899 Other long term (current) drug therapy: Secondary | ICD-10-CM

## 2012-04-04 DIAGNOSIS — E1142 Type 2 diabetes mellitus with diabetic polyneuropathy: Secondary | ICD-10-CM | POA: Diagnosis present

## 2012-04-04 DIAGNOSIS — E1165 Type 2 diabetes mellitus with hyperglycemia: Secondary | ICD-10-CM | POA: Diagnosis present

## 2012-04-04 DIAGNOSIS — A498 Other bacterial infections of unspecified site: Secondary | ICD-10-CM | POA: Diagnosis present

## 2012-04-04 DIAGNOSIS — N10 Acute tubulo-interstitial nephritis: Principal | ICD-10-CM | POA: Diagnosis present

## 2012-04-04 DIAGNOSIS — Z8249 Family history of ischemic heart disease and other diseases of the circulatory system: Secondary | ICD-10-CM | POA: Diagnosis not present

## 2012-04-04 DIAGNOSIS — R739 Hyperglycemia, unspecified: Secondary | ICD-10-CM

## 2012-04-04 DIAGNOSIS — N189 Chronic kidney disease, unspecified: Secondary | ICD-10-CM | POA: Diagnosis present

## 2012-04-04 DIAGNOSIS — R1110 Vomiting, unspecified: Secondary | ICD-10-CM | POA: Diagnosis not present

## 2012-04-04 DIAGNOSIS — N390 Urinary tract infection, site not specified: Secondary | ICD-10-CM | POA: Diagnosis not present

## 2012-04-04 DIAGNOSIS — Z87891 Personal history of nicotine dependence: Secondary | ICD-10-CM | POA: Diagnosis not present

## 2012-04-04 DIAGNOSIS — IMO0001 Reserved for inherently not codable concepts without codable children: Secondary | ICD-10-CM | POA: Diagnosis not present

## 2012-04-04 DIAGNOSIS — F411 Generalized anxiety disorder: Secondary | ICD-10-CM | POA: Diagnosis present

## 2012-04-04 DIAGNOSIS — I10 Essential (primary) hypertension: Secondary | ICD-10-CM | POA: Diagnosis present

## 2012-04-04 DIAGNOSIS — I129 Hypertensive chronic kidney disease with stage 1 through stage 4 chronic kidney disease, or unspecified chronic kidney disease: Secondary | ICD-10-CM | POA: Diagnosis present

## 2012-04-04 DIAGNOSIS — E118 Type 2 diabetes mellitus with unspecified complications: Secondary | ICD-10-CM

## 2012-04-04 DIAGNOSIS — Z7982 Long term (current) use of aspirin: Secondary | ICD-10-CM

## 2012-04-04 DIAGNOSIS — Z833 Family history of diabetes mellitus: Secondary | ICD-10-CM

## 2012-04-04 DIAGNOSIS — R112 Nausea with vomiting, unspecified: Secondary | ICD-10-CM | POA: Diagnosis not present

## 2012-04-04 HISTORY — DX: Other complications of anesthesia, initial encounter: T88.59XA

## 2012-04-04 HISTORY — DX: Gastro-esophageal reflux disease without esophagitis: K21.9

## 2012-04-04 HISTORY — DX: Cardiac murmur, unspecified: R01.1

## 2012-04-04 HISTORY — DX: Encounter for other specified aftercare: Z51.89

## 2012-04-04 HISTORY — DX: Acute myocardial infarction, unspecified: I21.9

## 2012-04-04 HISTORY — DX: Anxiety disorder, unspecified: F41.9

## 2012-04-04 HISTORY — DX: Major depressive disorder, single episode, unspecified: F32.9

## 2012-04-04 HISTORY — DX: Pneumonia, unspecified organism: J18.9

## 2012-04-04 HISTORY — DX: Type 2 diabetes mellitus without complications: E11.9

## 2012-04-04 HISTORY — DX: Type 2 diabetes mellitus with diabetic autonomic (poly)neuropathy: E11.43

## 2012-04-04 HISTORY — DX: Adverse effect of unspecified anesthetic, initial encounter: T41.45XA

## 2012-04-04 HISTORY — DX: Depression, unspecified: F32.A

## 2012-04-04 HISTORY — DX: Type 2 diabetes mellitus with diabetic autonomic (poly)neuropathy: K31.84

## 2012-04-04 HISTORY — DX: Anemia, unspecified: D64.9

## 2012-04-04 LAB — GLUCOSE, CAPILLARY
Glucose-Capillary: 375 mg/dL — ABNORMAL HIGH (ref 70–99)
Glucose-Capillary: 357 mg/dL — ABNORMAL HIGH (ref 70–99)

## 2012-04-04 LAB — CBC
HCT: 49.8 % — ABNORMAL HIGH (ref 36.0–46.0)
Hemoglobin: 17.7 g/dL — ABNORMAL HIGH (ref 12.0–15.0)
MCV: 88.8 fL (ref 78.0–100.0)
Platelets: 132 10*3/uL — ABNORMAL LOW (ref 150–400)
RBC: 5.61 MIL/uL — ABNORMAL HIGH (ref 3.87–5.11)
WBC: 6.4 10*3/uL (ref 4.0–10.5)

## 2012-04-04 LAB — BASIC METABOLIC PANEL
CO2: 23 mEq/L (ref 19–32)
Calcium: 10.1 mg/dL (ref 8.4–10.5)
Glucose, Bld: 388 mg/dL — ABNORMAL HIGH (ref 70–99)
Sodium: 137 mEq/L (ref 135–145)

## 2012-04-04 LAB — DIFFERENTIAL
Eosinophils Relative: 0 % (ref 0–5)
Lymphocytes Relative: 13 % (ref 12–46)
Lymphs Abs: 0.9 10*3/uL (ref 0.7–4.0)
Monocytes Relative: 7 % (ref 3–12)

## 2012-04-04 LAB — HEPATIC FUNCTION PANEL
Alkaline Phosphatase: 113 U/L (ref 39–117)
Indirect Bilirubin: 0.4 mg/dL (ref 0.3–0.9)
Total Bilirubin: 0.5 mg/dL (ref 0.3–1.2)
Total Protein: 8.4 g/dL — ABNORMAL HIGH (ref 6.0–8.3)

## 2012-04-04 LAB — URINALYSIS, ROUTINE W REFLEX MICROSCOPIC
Bilirubin Urine: NEGATIVE
Ketones, ur: NEGATIVE mg/dL
Nitrite: POSITIVE — AB
Protein, ur: >300 mg/dL — AB
Urobilinogen, UA: 0.2 mg/dL (ref 0.0–1.0)

## 2012-04-04 MED ORDER — ONDANSETRON 8 MG/NS 50 ML IVPB
8.0000 mg | Freq: Four times a day (QID) | INTRAVENOUS | Status: DC | PRN
  Filled 2012-04-04 (×2): qty 8

## 2012-04-04 MED ORDER — LIDOCAINE 5 % EX PTCH
1.0000 | MEDICATED_PATCH | CUTANEOUS | Status: DC
  Administered 2012-04-04 – 2012-04-06 (×3): 1 via TRANSDERMAL
  Filled 2012-04-04 (×4): qty 1

## 2012-04-04 MED ORDER — SODIUM CHLORIDE 0.9 % IV BOLUS (SEPSIS)
1000.0000 mL | Freq: Once | INTRAVENOUS | Status: AC
  Administered 2012-04-04: 1000 mL via INTRAVENOUS

## 2012-04-04 MED ORDER — METOPROLOL TARTRATE 1 MG/ML IV SOLN
5.0000 mg | Freq: Two times a day (BID) | INTRAVENOUS | Status: DC
  Administered 2012-04-04 – 2012-04-05 (×3): 5 mg via INTRAVENOUS
  Filled 2012-04-04 (×4): qty 5

## 2012-04-04 MED ORDER — HYDROMORPHONE HCL PF 1 MG/ML IJ SOLN
1.0000 mg | INTRAMUSCULAR | Status: DC | PRN
  Administered 2012-04-04 – 2012-04-05 (×7): 1 mg via INTRAVENOUS
  Filled 2012-04-04 (×8): qty 1

## 2012-04-04 MED ORDER — INSULIN ASPART 100 UNIT/ML ~~LOC~~ SOLN
0.0000 [IU] | SUBCUTANEOUS | Status: DC
  Administered 2012-04-04: 1 [IU] via SUBCUTANEOUS
  Administered 2012-04-04: 9 [IU] via SUBCUTANEOUS
  Administered 2012-04-04: 3 [IU] via SUBCUTANEOUS
  Administered 2012-04-05: 2 [IU] via SUBCUTANEOUS
  Administered 2012-04-05: 3 [IU] via SUBCUTANEOUS
  Administered 2012-04-06: 2 [IU] via SUBCUTANEOUS
  Administered 2012-04-06: 1 [IU] via SUBCUTANEOUS

## 2012-04-04 MED ORDER — INSULIN GLARGINE 100 UNIT/ML ~~LOC~~ SOLN
10.0000 [IU] | SUBCUTANEOUS | Status: DC
  Administered 2012-04-04 – 2012-04-07 (×4): 10 [IU] via SUBCUTANEOUS

## 2012-04-04 MED ORDER — ENOXAPARIN SODIUM 30 MG/0.3ML ~~LOC~~ SOLN
30.0000 mg | SUBCUTANEOUS | Status: DC
  Administered 2012-04-04 – 2012-04-05 (×2): 30 mg via SUBCUTANEOUS
  Filled 2012-04-04 (×3): qty 0.3

## 2012-04-04 MED ORDER — HYDROMORPHONE HCL PF 1 MG/ML IJ SOLN
1.0000 mg | Freq: Once | INTRAMUSCULAR | Status: AC
  Administered 2012-04-04: 1 mg via INTRAVENOUS
  Filled 2012-04-04: qty 1

## 2012-04-04 MED ORDER — DEXTROSE 5 % IV SOLN
1.0000 g | Freq: Once | INTRAVENOUS | Status: AC
  Administered 2012-04-04: 08:00:00 via INTRAVENOUS
  Filled 2012-04-04: qty 10

## 2012-04-04 MED ORDER — DIPHENHYDRAMINE HCL 50 MG/ML IJ SOLN: 12.5000 mg | Freq: Three times a day (TID) | INTRAMUSCULAR | Status: DC | PRN

## 2012-04-04 MED ORDER — LORAZEPAM 2 MG/ML IJ SOLN
1.0000 mg | Freq: Four times a day (QID) | INTRAMUSCULAR | Status: DC | PRN
  Administered 2012-04-04 – 2012-04-05 (×3): 1 mg via INTRAVENOUS
  Administered 2012-04-06: 03:00:00 via INTRAVENOUS
  Administered 2012-04-06: 1 mg via INTRAVENOUS
  Filled 2012-04-04 (×4): qty 1

## 2012-04-04 MED ORDER — PANTOPRAZOLE SODIUM 40 MG IV SOLR
40.0000 mg | INTRAVENOUS | Status: DC
  Administered 2012-04-04 – 2012-04-06 (×3): 40 mg via INTRAVENOUS
  Filled 2012-04-04 (×4): qty 40

## 2012-04-04 MED ORDER — PROMETHAZINE HCL 25 MG/ML IJ SOLN
12.5000 mg | Freq: Once | INTRAMUSCULAR | Status: AC
  Administered 2012-04-04: 12.5 mg via INTRAVENOUS
  Filled 2012-04-04: qty 1

## 2012-04-04 MED ORDER — ONDANSETRON HCL 4 MG/2ML IJ SOLN
4.0000 mg | Freq: Once | INTRAMUSCULAR | Status: AC
  Administered 2012-04-04: 4 mg via INTRAVENOUS
  Filled 2012-04-04: qty 2

## 2012-04-04 MED ORDER — MORPHINE SULFATE 2 MG/ML IJ SOLN
2.0000 mg | INTRAMUSCULAR | Status: DC | PRN
  Administered 2012-04-04: 2 mg via INTRAVENOUS
  Filled 2012-04-04: qty 1

## 2012-04-04 MED ORDER — PROMETHAZINE HCL 25 MG/ML IJ SOLN
12.5000 mg | INTRAMUSCULAR | Status: DC | PRN
  Administered 2012-04-04 – 2012-04-06 (×5): 12.5 mg via INTRAVENOUS
  Filled 2012-04-04 (×5): qty 1

## 2012-04-04 MED ORDER — SODIUM CHLORIDE 0.45 % IV SOLN
INTRAVENOUS | Status: DC
  Administered 2012-04-04 – 2012-04-06 (×3): via INTRAVENOUS

## 2012-04-04 NOTE — ED Provider Notes (Signed)
Medical screening examination/treatment/procedure(s) were performed by non-physician practitioner and as supervising physician I was immediately available for consultation/collaboration.  Kalman Drape, MD 04/04/12 2139

## 2012-04-04 NOTE — ED Notes (Signed)
Received pt. From triage via w/c, NAD noted

## 2012-04-04 NOTE — ED Notes (Signed)
Pt states she is having pain in her left flank that started yesterday  Pt states it hurts to urinate  Pt describes the pain as throbbing  Pt states she has been vomiting   Pt is crying in triage

## 2012-04-04 NOTE — ED Provider Notes (Signed)
History     CSN: [id number]  Arrival date & time 04/04/12  0444   First MD Initiated Contact with Patient 04/04/12 [phone]      Chief Complaint  Patient presents with  . Flank Pain    (Consider location/radiation/quality/duration/timing/severity/associated sxs/prior treatment) Patient is a 46 y.o. female presenting with flank pain. The history is provided by the patient.  Flank Pain This is a new problem. The current episode started yesterday. The problem occurs constantly. The problem has been gradually worsening. Associated symptoms include abdominal pain, nausea, urinary symptoms and vomiting. Pertinent negatives include no chills or fever.  Pt reports left flank pain, and pain with urination since yesterday. Admits to nausea, vomiting. Denies diarrhea. Denies fever, admits to chills. Did not take any medications prior to coming.  Past Medical History  Diagnosis Date  . PANCREATITIS 05/09/2008  . NEUROPATHY, DIABETIC 02/23/2007  . Cyclic vomiting syndrome   . Hypertension   . Diabetes mellitus     Past Surgical History  Procedure Date  . Port-a-cath placement   . Cholecystectomy   . Abdominal hysterectomy   . Laparotomy and lysis of adhesions     Family History  Problem Relation Age of Onset  . Diabetes type II Mother   . Diabetes Mother   . Hypertension Mother   . Diabetes type II Sister   . Diabetes Sister   . Hypertension Sister   . Hypertension Brother     History  Substance Use Topics  . Smoking status: Former Smoker -- 0.5 packs/day for 22 years    Types: Cigarettes  . Smokeless tobacco: Never Used   Comment: Quit Date 02/22/2012  . Alcohol Use: No    OB History    Grav Para Term Preterm Abortions TAB SAB Ect Mult Living                  Review of Systems  Constitutional: Negative for fever and chills.  HENT: Negative.   Eyes: Negative.   Respiratory: Negative.   Cardiovascular: Negative.   Gastrointestinal: Positive for nausea, vomiting and  abdominal pain. Negative for diarrhea.  Genitourinary: Positive for dysuria, frequency and flank pain. Negative for hematuria and pelvic pain.  Musculoskeletal: Negative.   Skin: Negative.   Neurological: Negative.   Psychiatric/Behavioral: Negative.     Allergies  Erythromycin; Acetaminophen; and Oxycodone-acetaminophen  Home Medications   Current Outpatient Rx  Name Route Sig Dispense Refill  . ASPIRIN 81 MG PO TABS Oral Take 81 mg by mouth daily.      Marland Kitchen CARVEDILOL 25 MG PO TABS Oral Take 1 tablet (25 mg total) by mouth 2 (two) times daily with a meal. 30 tablet 11  . DICLOFENAC SODIUM 1 % TD GEL Topical Apply 1 application topically 4 (four) times daily. 100 g 3  . EPINEPHRINE 0.3 MG/0.3ML IJ DEVI  One dose Im as needed for severe anaphylaxis.    . INSULIN ASPART 100 UNIT/ML Thornton SOLN Subcutaneous Inject 4-8 Units into the skin 3 (three) times daily before meals. Sliding scale 4-8 units    . INSULIN GLARGINE 100 UNIT/ML  SOLN Subcutaneous Inject 10 Units into the skin every morning. . 10 mL 11  . LIDODERM 5 % EX PTCH Transdermal Place 1 patch onto the skin daily as needed. For pain    . LISINOPRIL 40 MG PO TABS Oral Take 1 tablet (40 mg total) by mouth daily. 30 tablet 11  . LORAZEPAM 1 MG PO TABS Oral Take 1 mg by  mouth every 4 (four) hours as needed. For anxiety     . OMEPRAZOLE 20 MG PO CPDR Oral Take 2 capsules (40 mg total) by mouth daily. 30 capsule 11  . OXYCODONE HCL 10 MG PO TABS Oral Take 1 tablet (10 mg total) by mouth 2 (two) times daily as needed. For pain 60 each 0  . PROMETHAZINE HCL 25 MG PO TABS Oral Take 1 tablet (25 mg total) by mouth every 4 (four) hours as needed. For nausea 30 tablet 3  . SENNA 8.6 MG PO TABS Oral Take 2 tablets (17.2 mg total) by mouth 2 (two) times daily. For constipation 60 each 3  . SUCRALFATE 1 G PO TABS Oral Take 1 tablet (1 g total) by mouth 4 (four) times daily -  with meals and at bedtime. 120 tablet 11    BP 162/74  Pulse 97   Temp(Src) 98.5 F (36.9 C) (Oral)  Resp 16  Ht 5\' 1"  (1.549 m)  Wt 173 lb (78.472 kg)  BMI 32.69 kg/m2  SpO2 100%  Physical Exam  Nursing note and vitals reviewed. Constitutional: She is oriented to person, place, and time. She appears well-developed and well-nourished.  HENT:  Head: Normocephalic.  Eyes: Conjunctivae are normal.  Neck: Neck supple.  Cardiovascular: Normal rate, regular rhythm and normal heart sounds.   Pulmonary/Chest: Effort normal and breath sounds normal. No respiratory distress.  Abdominal: Soft. Bowel sounds are normal. There is no tenderness. There is no rebound and no guarding.       Left CVA tenderness, left upper abdominal tenderness  Musculoskeletal: Normal range of motion.  Neurological: She is alert and oriented to person, place, and time.  Skin: Skin is warm and dry.  Psychiatric: She has a normal mood and affect.    ED Course  Procedures (including critical care time)  Left flank plain, with dysuria. Will get labs, UA, fluids and pain medications ordered.   Results for orders placed during the hospital encounter of 04/04/12  URINALYSIS, ROUTINE W REFLEX MICROSCOPIC      Component Value Range   Color, Urine YELLOW  YELLOW    APPearance TURBID (*) CLEAR    Specific Gravity, Urine 1.017  1.005 - 1.030    pH 6.5  5.0 - 8.0    Glucose, UA >1000 (*) NEGATIVE (mg/dL)   Hgb urine dipstick LARGE (*) NEGATIVE    Bilirubin Urine NEGATIVE  NEGATIVE    Ketones, ur NEGATIVE  NEGATIVE (mg/dL)   Protein, ur >300 (*) NEGATIVE (mg/dL)   Urobilinogen, UA 0.2  0.0 - 1.0 (mg/dL)   Nitrite POSITIVE (*) NEGATIVE    Leukocytes, UA LARGE (*) NEGATIVE   URINE MICROSCOPIC-ADD ON      Component Value Range   Squamous Epithelial / LPF FEW (*) RARE    WBC, UA TOO NUMEROUS TO COUNT  <3 (WBC/hpf)   RBC / HPF 21-50  <3 (RBC/hpf)   Bacteria, UA MANY (*) RARE   CBC      Component Value Range   WBC 6.4  4.0 - 10.5 (K/uL)   RBC 5.61 (*) 3.87 - 5.11 (MIL/uL)    Hemoglobin 17.7 (*) 12.0 - 15.0 (g/dL)   HCT 49.8 (*) 36.0 - 46.0 (%)   MCV 88.8  78.0 - 100.0 (fL)   MCH 31.6  26.0 - 34.0 (pg)   MCHC 35.5  30.0 - 36.0 (g/dL)   RDW 13.5  11.5 - 15.5 (%)   Platelets 132 (*) 150 - 400 (K/uL)  DIFFERENTIAL      Component Value Range   Neutrophils Relative 79 (*) 43 - 77 (%)   Neutro Abs 5.1  1.7 - 7.7 (K/uL)   Lymphocytes Relative 13  12 - 46 (%)   Lymphs Abs 0.9  0.7 - 4.0 (K/uL)   Monocytes Relative 7  3 - 12 (%)   Monocytes Absolute 0.5  0.1 - 1.0 (K/uL)   Eosinophils Relative 0  0 - 5 (%)   Eosinophils Absolute 0.0  0.0 - 0.7 (K/uL)   Basophils Relative 0  0 - 1 (%)   Basophils Absolute 0.0  0.0 - 0.1 (K/uL)  BASIC METABOLIC PANEL      Component Value Range   Sodium 137  135 - 145 (mEq/L)   Potassium 4.1  3.5 - 5.1 (mEq/L)   Chloride 99  96 - 112 (mEq/L)   CO2 23  19 - 32 (mEq/L)   Glucose, Bld 388 (*) 70 - 99 (mg/dL)   BUN 35 (*) 6 - 23 (mg/dL)   Creatinine, Ser 1.93 (*) 0.50 - 1.10 (mg/dL)   Calcium 10.1  8.4 - 10.5 (mg/dL)   GFR calc non Af Amer 30 (*) >90 (mL/min)   GFR calc Af Amer 35 (*) >90 (mL/min)  LIPASE, BLOOD      Component Value Range   Lipase 63 (*) 11 - 59 (U/L)  HEPATIC FUNCTION PANEL      Component Value Range   Total Protein 8.4 (*) 6.0 - 8.3 (g/dL)   Albumin 4.2  3.5 - 5.2 (g/dL)   AST 14  0 - 37 (U/L)   ALT 8  0 - 35 (U/L)   Alkaline Phosphatase 113  39 - 117 (U/L)   Total Bilirubin 0.5  0.3 - 1.2 (mg/dL)   Bilirubin, Direct 0.1  0.0 - 0.3 (mg/dL)   Indirect Bilirubin 0.4  0.3 - 0.9 (mg/dL)  GLUCOSE, CAPILLARY      Component Value Range   Glucose-Capillary 375 (*) 70 - 99 (mg/dL)   Comment 1 Documented in Chart     Comment 2 Notify RN    GLUCOSE, CAPILLARY      Component Value Range   Glucose-Capillary 357 (*) 70 - 99 (mg/dL)   Pt with hyperglycemia, UTI. Suspect pyelonephritis given her symptoms. She continues to have pain and nausea despite pain medications and anti emetics. i will admit pt for further  treatment.   Spoke with [facility] practice at [facility], will accept to Dr. Lindell Noe.    1. Pyelonephritis   2. Hyperglycemia       MDM  Pt with pyelonephritis. VS normal. Her pain and nausea not improving with medications here in ED. Pt will be admitted to inpatient for iv antibiotics and further obs.        Renold Genta, PA 04/04/12 1623

## 2012-04-04 NOTE — H&P (Signed)
[name] [name] is an 46 y.o. female.   PCP: [facility]   Chief Complaint: L sided abdominal pain and nausea HPI: 46 yo F with a history of cyclic vomiting and brittle diabetes presents as a transfer from [facility] ED with a complaint of abdominal pain following urination x 24 hours. Symptoms started acutely.  Pains is moderate to severe located in LLQ and epigastric area. Pain is intermittent and sharp. Exacerbated by palpation. Temporarily relieved by IV dilaudid given in the ED. She had 2 episode of small volume non-bloody, non-bilious emesis yesterday. She has had decreased PO intake due to persistent nausea. She did take her medications yesterday.  She denies fevers but does admit to hot and cold flashes. She denies vaginal discharge or irritation. She is sexually active with a long time partner (9 years). She denies diarrhea or constipation.   ED course: Patient received and 1 L NS bolus, IV CTX 1 gm x 1, Phenergan IV and Zofran IV and Dilaudid IV 1 mg x 2 doses.   Past Medical History  Diagnosis Date  . PANCREATITIS 05/09/2008  . NEUROPATHY, DIABETIC 02/23/2007  . Cyclic vomiting syndrome   . Hypertension   . Diabetes mellitus     Past Surgical History  Procedure Date  . Port-a-cath placement   . Cholecystectomy   . Abdominal hysterectomy   . Laparotomy and lysis of adhesions     Family History  Problem Relation Age of Onset  . Diabetes type II Mother   . Diabetes Mother   . Hypertension Mother   . Diabetes type II Sister   . Diabetes Sister   . Hypertension Sister   . Hypertension Brother    Social History:  reports that she has quit smoking. Her smoking use included Cigarettes. She has a 11 pack-year smoking history. She has never used smokeless tobacco. She reports that she does not drink alcohol or use illicit drugs.  Allergies:  Allergies  Allergen Reactions  . Erythromycin Nausea And Vomiting  . Acetaminophen Nausea And Vomiting  . Oxycodone-Acetaminophen  Nausea Only    Only intolerant to APAP component    Prescriptions prior to admission  Medication Sig Dispense Refill  . aspirin 81 MG tablet Take 81 mg by mouth daily.        . carvedilol (COREG) 25 MG tablet Take 1 tablet (25 mg total) by mouth 2 (two) times daily with a meal.  30 tablet  11  . diclofenac sodium (VOLTAREN) 1 % GEL Apply 1 application topically 4 (four) times daily.  100 g  3  . EPINEPHrine (EPIPEN) 0.3 MG/0.3ML DEVI One dose Im as needed for severe anaphylaxis.      Marland Kitchen insulin aspart (NOVOLOG) 100 UNIT/ML injection Inject 4-8 Units into the skin 3 (three) times daily before meals. Sliding scale 4-8 units      . insulin glargine (LANTUS) 100 UNIT/ML injection Inject 10 Units into the skin every morning. .  10 mL  11  . LIDODERM 5 % Place 1 patch onto the skin daily as needed. For pain      . lisinopril (PRINIVIL,ZESTRIL) 40 MG tablet Take 1 tablet (40 mg total) by mouth daily.  30 tablet  11  . LORazepam (ATIVAN) 1 MG tablet Take 1 mg by mouth every 4 (four) hours as needed. For anxiety       . omeprazole (PRILOSEC) 20 MG capsule Take 2 capsules (40 mg total) by mouth daily.  30 capsule  11  .  Oxycodone HCl 10 MG TABS Take 1 tablet (10 mg total) by mouth 2 (two) times daily as needed. For pain  60 each  0  . promethazine (PHENERGAN) 25 MG tablet Take 1 tablet (25 mg total) by mouth every 4 (four) hours as needed. For nausea  30 tablet  3  . senna (SENOKOT) 8.6 MG TABS Take 2 tablets (17.2 mg total) by mouth 2 (two) times daily. For constipation  60 each  3  . sucralfate (CARAFATE) 1 G tablet Take 1 tablet (1 g total) by mouth 4 (four) times daily -  with meals and at bedtime.  120 tablet  11   Pertinent Labs and Studies:  UA: turbid, sp grav 1.017, > 1000 glucose, > 300 protein, + Nitrite, large LE, large blood  U micro: few squamous cells, many bacteria  CBG: 388>375>357 A1c 12.2 on 01/20/12  WBC 6.4, Hgb 17.7, Hct 49.8, Plt 132  Na 137; K 4.1; Cr 1.9;  CA2+ 10.1  Lipase  63; AST 14,; ALT 8   ROS As per HPI  denies CP, SOB.  admits to itching on the bottom of her R foot.   Physical Exam  BP 189/99  Pulse 131  Temp(Src) 99.1 F (37.3 C) (Axillary)  Resp 22  Ht 5\' 1"  (1.549 m)  Wt 173 lb (78.472 kg)  BMI 32.69 kg/m2  SpO2 98% General appearance: alert, cooperative and mild distress Head: Normocephalic, without obvious abnormality, atraumatic Throat: dry lips  Lungs: clear to auscultation bilaterally Heart: regular rate and rhythm, S1, S2 normal, no murmur, click, rub or gallop Abdomen: Normal active bowel sounds. Soft. Healed midline abdominal scar.  Mild tenderness L lower abdomen. No masses, no rebound, no guarding. Healed midline abdominal scar.  Billateral CVA tenderness L>R Extremities: extremities normal, atraumatic, no cyanosis or edema, Callus overlying R first metatarsal no rash, erythema or skin breakdown.  Pulses: 2+ and symmetric Skin: Skin color, texture, turgor normal. No rashes or lesions Neurologic: Grossly normal  Assessment/Plan 46 yo F with hx of difficult to control cylic vomiting and brittle diabetes presents with acute onset of abdominal pain and nausea.   1. Abdominal pain and nausea A: Most likely upper urinary tract infection given findings on UA. I have also considered PID but patient w/o vaginal discharge and is s/p total abdominal hysterectomy. There may be concomitant exacerbation of cyclic vomiting which patient states has been well controlled since last hospital stay in 11/2011. She is afebrile with normal vital signs not suggestive of sepsis. Her physical exam is not concerning for a surgical abdomen.  P:  - Continue IV CTX  Per pharmacy for upper urinary tract infection -F/u Urine Gm stain and culture to help guide antibiotic therapy.  -Zofran and Phenergan  IV prn nausea. Of note patient takes phenergan chronically at home.  -Morphine  IV prn pain.  -Protonix 40 mg q D given GERD hx and epigastric pain with emesis.   -NPO for now with 1/2 NS @ 125 ml/hr  -f/u temps and AM CBC.  2. Diabetes type 2 A: brittle diabetic. Last A1c in 1/13 elevated to 12.2. Patient with glucose but no ketones in her urine. CBG elevated to 388 but trending down with IV fluids.  P:  - IVFs -q AC and HS CBGs  -Lantus 10 U q AM (home dose) -Sensitive sliding scale insulin  3. Chronic kidney disease: Baseline Cr 1.5-1.8. Creatinine now slightly elevated from baseline in the setting of mild dehydration and upper urinary  tract infection.  P: CTX per pharmacy, IV fluids, trend Cr on repeat BMET in AM.   4. HTN: Hold ACE (lisinopril). Continue BB with use  Metoprolol 5 mg IV while NPO may need to increase to 10 mg IV BID as patient is on coreg 25 mg PO BID at home.   5. Anxiety: continue home ativan. Also low dose benadryl prn itching.   5. FEN/GI: electrolytes wnl except per above. NPO. IVFs. Advance diet at tolerated.   7. DVT PPx: Lovenox 30 mg q D.   8. Code: Full   9. Dispo: pending clinical improvement and work-up  to home.   Bader Stubblefield 04/04/2012, 1:58 PM

## 2012-04-04 NOTE — H&P (Signed)
FMTS Attending Admit Note  Patient seen and examined by me, discussed with resident team and I agree with the plan as detailed by Dr Adrian Blackwater above.  Briefly, a 38yoF who is well known to our service for multiple admissions for cyclic vomiting and brittle diabetes, transferred from [facility] for recurrent emesis and abdominal pain, dysuria and UA that is consistent with UTI.  She endorses chills and sweats since onset of symptoms over the past 1 day.  No objective fever recorded since presentation.   On exam she appears in moderate discomfort with heaving; able to answer questions thoroughly.  Not in acute respiratory distress. She denies vaginal discharge; reports history of BV approximately 3 years ago.  Is sexually active with same female partner for the past 10 years.  I agree with likely diagnosis of UTI, question upper versus lower tract infection given absence of WBC count or objective fevers.  She does have flank pain and reports of sweats/chills that support pyelo.  I agree with abx regimen and urine culture, antiemetics and rehydration, close follow up of glucose and consideration that glucose control may improve as her infection gets under better control.  Dalbert Mayotte, MD

## 2012-04-04 NOTE — ED Notes (Signed)
Report given-[facility]  To transport

## 2012-04-04 NOTE — ED Notes (Signed)
Requesting ativan for nausea

## 2012-04-05 DIAGNOSIS — R112 Nausea with vomiting, unspecified: Secondary | ICD-10-CM | POA: Diagnosis not present

## 2012-04-05 DIAGNOSIS — N390 Urinary tract infection, site not specified: Secondary | ICD-10-CM | POA: Diagnosis not present

## 2012-04-05 LAB — URINE CULTURE: Culture  Setup Time: 201304090846

## 2012-04-05 LAB — GLUCOSE, CAPILLARY
Glucose-Capillary: 140 mg/dL — ABNORMAL HIGH (ref 70–99)
Glucose-Capillary: 165 mg/dL — ABNORMAL HIGH (ref 70–99)
Glucose-Capillary: 220 mg/dL — ABNORMAL HIGH (ref 70–99)
Glucose-Capillary: 85 mg/dL (ref 70–99)

## 2012-04-05 LAB — CBC
Hemoglobin: 10.7 g/dL — ABNORMAL LOW (ref 12.0–15.0)
MCHC: 34.3 g/dL (ref 30.0–36.0)
RBC: 3.46 MIL/uL — ABNORMAL LOW (ref 3.87–5.11)

## 2012-04-05 LAB — BASIC METABOLIC PANEL
CO2: 23 mEq/L (ref 19–32)
GFR calc non Af Amer: 44 mL/min — ABNORMAL LOW (ref >90)
Glucose, Bld: 95 mg/dL (ref 70–99)
Potassium: 3.3 mEq/L — ABNORMAL LOW (ref 3.5–5.1)
Sodium: 138 mEq/L (ref 135–145)

## 2012-04-05 MED ORDER — DEXTROSE 5 % IV SOLN
1.0000 g | INTRAVENOUS | Status: DC
  Administered 2012-04-05 – 2012-04-06 (×2): 1 g via INTRAVENOUS
  Filled 2012-04-05 (×2): qty 10

## 2012-04-05 MED ORDER — HYDROMORPHONE HCL PF 1 MG/ML IJ SOLN
1.0000 mg | INTRAMUSCULAR | Status: DC | PRN
  Administered 2012-04-05 – 2012-04-06 (×4): 1 mg via INTRAVENOUS
  Filled 2012-04-05 (×4): qty 1

## 2012-04-05 MED ORDER — SODIUM CHLORIDE 0.9 % IJ SOLN
10.0000 mL | INTRAMUSCULAR | Status: DC | PRN
  Administered 2012-04-05: 10 mL

## 2012-04-05 MED ORDER — MORPHINE SULFATE CR 30 MG PO TB12
30.0000 mg | ORAL_TABLET | Freq: Two times a day (BID) | ORAL | Status: DC
  Administered 2012-04-05: 30 mg via ORAL
  Filled 2012-04-05: qty 1

## 2012-04-05 MED ORDER — CARVEDILOL 25 MG PO TABS
25.0000 mg | ORAL_TABLET | Freq: Two times a day (BID) | ORAL | Status: DC
  Administered 2012-04-06 – 2012-04-07 (×3): 25 mg via ORAL
  Filled 2012-04-05 (×5): qty 1

## 2012-04-05 MED ORDER — DIPHENHYDRAMINE HCL 25 MG PO CAPS
25.0000 mg | ORAL_CAPSULE | Freq: Four times a day (QID) | ORAL | Status: DC | PRN
  Administered 2012-04-05: 25 mg via ORAL
  Filled 2012-04-05: qty 1

## 2012-04-05 NOTE — Progress Notes (Signed)
FMTS Attending Note  Patient seen and examined by me today, I agree with Dr. Thea Gist assessment and plan as documented.  Patient appears much more comfortable today.   Plan for another dose iv ceftriaxone today, then change to oral antibiotics in anticipation of discharge to home tomorrow. Dalbert Mayotte, MD

## 2012-04-05 NOTE — Progress Notes (Signed)
Inpatient Diabetes Program Recommendations  AACE/ADA: New Consensus Statement on Inpatient Glycemic Control (2009)  Target Ranges:  Prepandial:   less than 140 mg/dL      Peak postprandial:   less than 180 mg/dL (1-2 hours)      Critically ill patients:  140 - 180 mg/dL   Reason for Visit: Results for MAKILA, MALER (MRN [id number]) as of 04/05/2012 13:48  Ref. Range 04/05/2012 04:53 04/05/2012 07:54 04/05/2012 12:26  Glucose-Capillary Latest Range: 70-99 mg/dL 71 85 216 (H)    Inpatient Diabetes Program Recommendations Insulin - Meal Coverage: Add Novolog meal coverage 4 units tid with meals.  Note: Will follow.

## 2012-04-05 NOTE — Progress Notes (Signed)
Clinical Social Work Department BRIEF PSYCHOSOCIAL ASSESSMENT 04/05/2012  Patient:  [name] [name], [name] [name]     Account Number:  [id number]     Admit date:  04/04/2012  Clinical Social Worker:  Valda Lamb  Date/Time:  04/05/2012 11:31 AM  Referred by:  Physician  Date Referred:  04/04/2012 Referred for  Other - See comment   Other Referral:   Pt states she is not able to pay her electric bill.   Interview type:  Patient Other interview type:    PSYCHOSOCIAL DATA Living Status:  ALONE Admitted from facility:   Level of care:   Primary support name:  [name] [name] [phone] Primary support relationship to patient:  SIBLING Degree of support available:   Pt is pretty independent however does have a sister locally.    CURRENT CONCERNS Current Concerns  Financial Resources   Other Concerns:   Pt states she cannot pay her $200+ light bill that is due on Friday.    SOCIAL WORK ASSESSMENT / PLAN CSW visited pt room and spoke with pt regarding pt concerns about being unable to pay her electric bill. Pt states she owes $200+ and though she receives disability her check goes to her rent ($500+) which is more than what her check is for. Pt states she has not contacted [employer] however will contact them to see if she can get on a payment plan. Pt states next month her home will be under public housing and therefore she will not be paying rent. Pt states she will be able to pay her bills on time. CSW informed pt that she can go to [employer] to receive assistance on her Warehouse manager. Pt stated she has never received assistance from either. CSW informed pt to take her bill, proof of disability check and rent to either agency and they should be able to assist with the bill. Pt MD walked in the room while CSW assessing. CSW provided pt with her contact number and encouraged her to call if she has additional questions.   Assessment/plan status:  No Further Intervention  Required Other assessment/ plan:   Information/referral to community resources:   CSW informed pt that she can go to [employer] to receive assistance with her Warehouse manager. Pt stated she did not need a phone number or address as she is familiar with the agency. Pt also states she has contact informatin for [employer].    PATIENT'S/FAMILY'S RESPONSE TO PLAN OF CARE: Pt was alert and oriented and laying in bed. Pt states she feels much better today. Pt states she is unable to pay her electric bill however will contact [employer]/ and or go to [employer] at [employer]. Pt appreciative of CSW visit and will contact her while in the hospital if she is need of additional resources.      Hunt Oris, MSW, [place]

## 2012-04-05 NOTE — Progress Notes (Signed)
Daily Progress Note [name] [name]. Maricela Bo, M.D., M.B.A  Family Medicine PGY-1 Pager [phone]  Subjective: Patient awake lying in bed, acknowledges "hunger pains", would like to try clear liquids, also wants ativan   Objective: Vital signs in last 24 hours: Temp:  [97.4 F (36.3 C)-99.1 F (37.3 C)] 97.8 F (36.6 C) (04/10 0600) Pulse Rate:  [81-131] 86  (04/10 0600) Resp:  [18-22] 20  (04/10 0600) BP: (136-208)/(80-110) 174/96 mmHg (04/10 0651) SpO2:  [97 %-100 %] 98 % (04/10 0600) Weight change:  Last BM Date: 04/04/12  Intake/Output from previous day: 04/09 0701 - 04/10 0700 In: 3685.4 [I.V.:3685.4] Out: 6 [Urine:1; Emesis/NG output:5] Intake/Output this shift:    Gen: alert, oriented x 4, mild distress HEENT: OP clear and dry; NCAT, EOMI Cardiac: RRR, no murmurs Lungs: CTA-B Abd: soft, non-distended, mild tenderness to guarding, NABS Extremities: warm, well perfused   Lab Results:  [facility] 04/05/12 0450 04/04/12 0745  WBC 12.6* 6.4  HGB 10.7* 17.7*  HCT 31.2* 49.8*  PLT 177 132*   BMET  Basename 04/05/12 0450 04/04/12 0745  NA 138 137  K 3.3* 4.1  CL 104 99  CO2 23 23  GLUCOSE 95 388*  BUN 26* 35*  CREATININE 1.39* 1.93*  CALCIUM 9.2 10.1   CBG (last 3)   Basename 04/05/12 0754 04/05/12 0453 04/04/12 2355  GLUCAP 85 71 165*      Studies/Results: No results found.  Medications: I have reviewed the patient's current medications.  Assessment/Plan: 46 year old F with cyclic vomiting syndrome who presents with UTI, intractable nausea and vomiting and dehydration:  1. GI - patient improving overnight  - continue regimen of ativan 1mg  q 6, benadryl, dilaudid, phenergan and Zofran  - if tolerated PO, then change meds to PO  2. ID - UTI noted on UA; E. Coli > 100,000 colonies grown on culture  - given CTX x 1, need to repeat dose of CTX today  - switch to alternative antibiotic when sensitivities are back  3. Endocrine - patient with Type 2 DM,  well controlled right now  - continue current insulin regimen of Lantus 10 units with sensitive sliding scale  4. CV -  HTN poorly controlled since she cannot tolerate her PO medication  - continue metoprolol IV until able to tolerate PO; add hydralazine PRN  5. Renal - AKI improving with fluid, continue IV   6. FEN - 1/2 NS @ 125, may need fluid bolus is not tolerating clear liquids,  - replete K+ if not tolerating PO today   7. Dispo - d/c when stable and taking oral meds, earliest 04/06/12     LOS: 1 day   Dewain Penning 04/05/2012, 8:26 AM

## 2012-04-06 DIAGNOSIS — N390 Urinary tract infection, site not specified: Secondary | ICD-10-CM | POA: Diagnosis not present

## 2012-04-06 DIAGNOSIS — R112 Nausea with vomiting, unspecified: Secondary | ICD-10-CM | POA: Diagnosis not present

## 2012-04-06 LAB — BASIC METABOLIC PANEL
BUN: 16 mg/dL (ref 6–23)
CO2: 23 mEq/L (ref 19–32)
Chloride: 107 mEq/L (ref 96–112)
Creatinine, Ser: 1.33 mg/dL — ABNORMAL HIGH (ref 0.50–1.10)
Glucose, Bld: 86 mg/dL (ref 70–99)

## 2012-04-06 LAB — GLUCOSE, CAPILLARY
Glucose-Capillary: 146 mg/dL — ABNORMAL HIGH (ref 70–99)
Glucose-Capillary: 181 mg/dL — ABNORMAL HIGH (ref 70–99)
Glucose-Capillary: 200 mg/dL — ABNORMAL HIGH (ref 70–99)

## 2012-04-06 LAB — CBC
HCT: 28.4 % — ABNORMAL LOW (ref 36.0–46.0)
Hemoglobin: 9.8 g/dL — ABNORMAL LOW (ref 12.0–15.0)
MCV: 90.4 fL (ref 78.0–100.0)
RBC: 3.14 MIL/uL — ABNORMAL LOW (ref 3.87–5.11)
RDW: 13.7 % (ref 11.5–15.5)
WBC: 5.9 10*3/uL (ref 4.0–10.5)

## 2012-04-06 MED ORDER — POTASSIUM CHLORIDE 10 MEQ/100ML IV SOLN
10.0000 meq | INTRAVENOUS | Status: AC
  Administered 2012-04-06: 10 meq via INTRAVENOUS
  Filled 2012-04-06 (×3): qty 100

## 2012-04-06 MED ORDER — HYDROMORPHONE HCL PF 1 MG/ML IJ SOLN: 1.0000 mg | Freq: Four times a day (QID) | INTRAMUSCULAR | Status: DC | PRN

## 2012-04-06 MED ORDER — PROMETHAZINE HCL 25 MG PO TABS
12.5000 mg | ORAL_TABLET | Freq: Four times a day (QID) | ORAL | Status: DC | PRN
  Administered 2012-04-06 – 2012-04-07 (×2): 12.5 mg via ORAL
  Filled 2012-04-06 (×2): qty 1

## 2012-04-06 MED ORDER — ENOXAPARIN SODIUM 40 MG/0.4ML ~~LOC~~ SOLN
40.0000 mg | SUBCUTANEOUS | Status: DC
  Administered 2012-04-06: 40 mg via SUBCUTANEOUS
  Filled 2012-04-06 (×2): qty 0.4

## 2012-04-06 MED ORDER — SODIUM CHLORIDE 0.9 % IV BOLUS (SEPSIS)
500.0000 mL | Freq: Once | INTRAVENOUS | Status: AC
  Administered 2012-04-06: 500 mL via INTRAVENOUS

## 2012-04-06 MED ORDER — INSULIN ASPART 100 UNIT/ML ~~LOC~~ SOLN
0.0000 [IU] | Freq: Three times a day (TID) | SUBCUTANEOUS | Status: DC
  Administered 2012-04-06 – 2012-04-07 (×2): 2 [IU] via SUBCUTANEOUS

## 2012-04-06 MED ORDER — PROMETHAZINE HCL 25 MG/ML IJ SOLN
12.5000 mg | Freq: Four times a day (QID) | INTRAMUSCULAR | Status: DC | PRN
  Filled 2012-04-06: qty 1

## 2012-04-06 MED ORDER — LORAZEPAM 1 MG PO TABS: 1.0000 mg | ORAL_TABLET | Freq: Four times a day (QID) | ORAL | Status: DC | PRN

## 2012-04-06 MED ORDER — HYDROMORPHONE HCL 2 MG PO TABS
1.0000 mg | ORAL_TABLET | Freq: Four times a day (QID) | ORAL | Status: DC | PRN
  Administered 2012-04-06 – 2012-04-07 (×3): 1 mg via ORAL
  Filled 2012-04-06 (×2): qty 1
  Filled 2012-04-06: qty 2

## 2012-04-06 MED ORDER — LISINOPRIL 40 MG PO TABS
40.0000 mg | ORAL_TABLET | Freq: Every day | ORAL | Status: DC
  Administered 2012-04-06 – 2012-04-07 (×2): 40 mg via ORAL
  Filled 2012-04-06 (×2): qty 1

## 2012-04-06 MED ORDER — LORAZEPAM 2 MG/ML IJ SOLN: 1.0000 mg | Freq: Four times a day (QID) | INTRAMUSCULAR | Status: DC | PRN

## 2012-04-06 NOTE — Progress Notes (Signed)
Daily Progress Note [name] [name]. Maricela Bo, M.D., M.B.A  Family Medicine PGY-1 Pager [phone]  Subjective: Mild nausea, mild left flank pain, no vomiting, would like to try to advance diet at lunch  Objective: Vital signs in last 24 hours: Temp:  [97 F (36.1 C)-98 F (36.7 C)] 97 F (36.1 C) (04/11 0600) Pulse Rate:  [74-87] 87  (04/11 0816) Resp:  [16-20] 16  (04/11 0600) BP: (147-176)/(70-87) 176/87 mmHg (04/11 0816) SpO2:  [97 %-100 %] 100 % (04/11 0600) Weight change:  Last BM Date: 04/04/12  Intake/Output from previous day: 04/10 0701 - 04/11 0700 In: 1320 [P.O.:1320] Out: -  Intake/Output this shift:    Gen: alert, oriented x 4, mild distress HEENT: OP clear and very dry; NCAT, EOMI Cardiac: RRR, no murmurs Lungs: CTA-B Abd: soft, non-distended, mild tenderness to guarding, NABS Extremities: warm, well perfused   Lab Results:  [facility] 04/06/12 0605 04/05/12 0450  WBC 5.9 12.6*  HGB 9.8* 10.7*  HCT 28.4* 31.2*  PLT 166 177   BMET  Basename 04/06/12 0605 04/05/12 0450  NA 138 138  K 3.3* 3.3*  CL 107 104  CO2 23 23  GLUCOSE 86 95  BUN 16 26*  CREATININE 1.33* 1.39*  CALCIUM 8.6 9.2   CBG (last 3)   Basename 04/06/12 0749 04/06/12 0404 04/05/12 2354  GLUCAP 88 83 146*    Lab Results  Component Value Date   HGBA1C 8.2* 04/05/2012     Studies/Results: No results found.  Medications: I have reviewed the patient's current medications.  Assessment/Plan: 46 year old F with cyclic vomiting syndrome who presents with UTI, intractable nausea and vomiting and dehydration:  1. GI - patient improving overnight  - continue regimen of ativan, dilaudid, phenergan q 6  - CHANGE MEDS TO PPO   2. ID - UTI noted on UA; E. Coli > 100,000 colonies grown on culture  - given CTX x 3    - d/c CTX and start cipro 500 mg po q 12  3. Endocrine - patient with Type 2 DM, well controlled right now  - Add mealtime coverage since starting to eat again  4. CV -   HTN poorly controlled since she cannot tolerate her PO medication  - Now on home meds of carvedilol and lisinopril   5. Renal - AKI improving with fluid, continue IV   6. FEN - decrease IV fluid for PO challenge,  - replete K+ today b/c still 3.3  7. Dispo - d/c when stable and taking oral meds, likely 04/07/12     LOS: 2 days   Dewain Penning 04/06/2012, 8:28 AM

## 2012-04-06 NOTE — Progress Notes (Addendum)
Inpatient Diabetes Program Recommendations  AACE/ADA: New Consensus Statement on Inpatient Glycemic Control (2009)  Target Ranges:  Prepandial:   less than 140 mg/dL      Peak postprandial:   less than 180 mg/dL (1-2 hours)      Critically ill patients:  140 - 180 mg/dL   Reason for Visit: CBG dropped to 49 at supper last pm.  Consider reducing Lantus to 8 units daily.  Does not appear to need meal coverage based on CBG's yesterday.  Will follow. A1C=8.2% (this is improved from January, 2013)

## 2012-04-06 NOTE — Progress Notes (Signed)
FMTS Attending Note Patient seen and examined by me at 1530PM, reports feeling better.  Tolerated lunch tray without nausea or emesis.  Had another dose ceftriaxone this morning, is getting 500cc NS bolus now.   Plan to change to oral cipro in morning, and discharge home tomorrow if tolerates orals.  Dalbert Mayotte, MD

## 2012-04-07 DIAGNOSIS — N390 Urinary tract infection, site not specified: Secondary | ICD-10-CM | POA: Diagnosis not present

## 2012-04-07 DIAGNOSIS — R112 Nausea with vomiting, unspecified: Secondary | ICD-10-CM | POA: Diagnosis not present

## 2012-04-07 LAB — GLUCOSE, CAPILLARY
Glucose-Capillary: 110 mg/dL — ABNORMAL HIGH (ref 70–99)
Glucose-Capillary: 189 mg/dL — ABNORMAL HIGH (ref 70–99)

## 2012-04-07 MED ORDER — CEPHALEXIN 500 MG PO CAPS: 500.0000 mg | ORAL_CAPSULE | Freq: Three times a day (TID) | ORAL | Status: AC

## 2012-04-07 MED ORDER — SENNA 8.6 MG PO TABS
2.0000 | ORAL_TABLET | ORAL | Status: AC
  Administered 2012-04-07: 17.2 mg via ORAL
  Filled 2012-04-07: qty 2

## 2012-04-07 MED ORDER — CEPHALEXIN 500 MG PO CAPS
500.0000 mg | ORAL_CAPSULE | Freq: Three times a day (TID) | ORAL | Status: DC
  Administered 2012-04-07: 500 mg via ORAL
  Filled 2012-04-07 (×3): qty 1

## 2012-04-07 MED ORDER — OXYCODONE HCL 10 MG PO TABS: 10.0000 mg | ORAL_TABLET | Freq: Two times a day (BID) | ORAL | Status: DC | PRN

## 2012-04-07 NOTE — Progress Notes (Signed)
FMTS Attending Note  Patient seen and examined by me, is feeling well and states she is ready for discharge.  Has tolerated by mouth without emesis this morning.   Plan for discharge to complete course of oral Cipro for her recent diagnosis of UTI.  Outpatient follow up.  Dalbert Mayotte, MD

## 2012-04-07 NOTE — Discharge Summary (Signed)
Physician Discharge Summary  Patient ID: [name] [name] MRN: [id number] DOB/AGE: Oct 27, 1966 46 y.o.  Admit date: 04/04/2012 Discharge date: 04/07/2012  Admission Diagnoses: Intractable Vomiting, Pyelonephritis   Discharge Diagnoses:  Active Problems:  DIABETES MELLITUS, II, COMPLICATIONS  ANXIETY  HYPERTENSION, BENIGN SYSTEMIC  GERD  Cyclic vomiting syndrome  UTI (lower urinary tract infection)   Discharged Condition: good  Hospital Course:  [name] [name] presented with intractable nausea and vomiting as well as a urinary tract infection.   1. Cyclic Vomiting Syndrome - The trigger for this may have been the UTI, since the patient was complaining of left flank pain on admission. She was treated with her usual cocktail of phenergan 1 mg, lorazepam 1 mg, and dilaudid 1 mg q 6 hours as needed. She was also placed on IV hydration. Her nausea lessen throughout her stay and her PO intake increase. Overnight, her nausea was controlled with all PO medications, and she a normal breakfast. Therefore, she was appropriate for discharge.   2. Pyelonephritis - [name] [name] presented with a UTI and flank pain. She did not have a fever, but was treated for pyelonephritis with 3 days of ceftriaxone. The culture revealed pan-sensitive E. Coli. Therefore, she was switch to PO cephalexin, which she tolerated well prior to discharge.   Other problems that were treated in   Consults: None  Significant Diagnostic Studies:   CBG (last 3)   Basename 04/07/12 0647 04/06/12 1708 04/06/12 1150  GLUCAP 110* 181* 200*    Discharge Exam: Blood pressure 152/80, pulse 75, temperature 98.5 F (36.9 C), temperature source Oral, resp. rate 18, height 5\' 1"  (1.549 m), weight 177 lb 11.1 oz (80.6 kg), SpO2 99.00%. Gen: alert, oriented x 4, non-distressed, very pleasant and asking to go home HEENT: OP clear and moist; NCAT, EOMI  Cardiac: RRR, no murmurs  Lungs: CTA-B  Abd: soft, non-distended, non-tender, NABS    Extremities: warm, well perfused    Disposition: 01-Home or Self Care   Medication List  As of 04/07/2012  8:33 AM   ASK your doctor about these medications         aspirin 81 MG tablet   Take 81 mg by mouth daily.      carvedilol 25 MG tablet   Commonly known as: COREG   Take 1 tablet (25 mg total) by mouth 2 (two) times daily with a meal.      diclofenac sodium 1 % Gel   Commonly known as: VOLTAREN   Apply 1 application topically 4 (four) times daily.      EPIPEN 0.3 mg/0.3 mL Devi   Generic drug: EPINEPHrine   One dose Im as needed for severe anaphylaxis.      insulin aspart 100 UNIT/ML injection   Commonly known as: novoLOG   Inject 4-8 Units into the skin 3 (three) times daily before meals. Sliding scale 4-8 units      insulin glargine 100 UNIT/ML injection   Commonly known as: LANTUS   Inject 10 Units into the skin every morning. Marland Kitchen      LIDODERM 5 %   Generic drug: lidocaine   Place 1 patch onto the skin daily as needed. For pain      lisinopril 40 MG tablet   Commonly known as: PRINIVIL,ZESTRIL   Take 1 tablet (40 mg total) by mouth daily.      LORazepam 1 MG tablet   Commonly known as: ATIVAN   Take 1 mg by mouth every 4 (four) hours  as needed. For anxiety      omeprazole 20 MG capsule   Commonly known as: PRILOSEC   Take 2 capsules (40 mg total) by mouth daily.      Oxycodone HCl 10 MG Tabs   Take 1 tablet (10 mg total) by mouth 2 (two) times daily as needed. For pain      promethazine 25 MG tablet   Commonly known as: PHENERGAN   Take 1 tablet (25 mg total) by mouth every 4 (four) hours as needed. For nausea      senna 8.6 MG Tabs   Commonly known as: SENOKOT   Take 2 tablets (17.2 mg total) by mouth 2 (two) times daily. For constipation      sucralfate 1 G tablet   Commonly known as: CARAFATE   Take 1 tablet (1 g total) by mouth 4 (four) times daily -  with meals and at bedtime.           Follow-up Information    Follow up with  Marlana Salvage, MD .         Signed: Dewain Penning 04/07/2012, 8:33 AM

## 2012-04-07 NOTE — Progress Notes (Signed)
   CARE MANAGEMENT NOTE 04/07/2012  Patient:  [name] [name], [name] [name]   Account Number:  [id number]  Date Initiated:  04/06/2012  Documentation initiated by:  Jasmine Pang  Subjective/Objective Assessment:   Request for assist with meds     Action/Plan:   Pt has insurance therefore CM is unable to assist.   Anticipated DC Date:  04/07/2012   Anticipated DC Plan:  [place]  CM consult      Choice offered to / List presented to:             Status of service:  Completed, signed off Medicare Important Message given?   (If response is "NO", the following Medicare IM given date fields will be blank) Date Medicare IM given:   Date Additional Medicare IM given:    Discharge Disposition:    Per UR Regulation:    If discussed at Long Length of Stay Meetings, dates discussed:    Comments:

## 2012-04-11 ENCOUNTER — Other Ambulatory Visit: Payer: Self-pay | Admitting: Family Medicine

## 2012-05-04 ENCOUNTER — Ambulatory Visit (INDEPENDENT_AMBULATORY_CARE_PROVIDER_SITE_OTHER): Payer: Medicare Other | Admitting: Family Medicine

## 2012-05-04 VITALS — BP 92/61 | HR 80 | Temp 98.4°F | Ht 61.0 in | Wt 163.0 lb

## 2012-05-04 DIAGNOSIS — J302 Other seasonal allergic rhinitis: Secondary | ICD-10-CM | POA: Insufficient documentation

## 2012-05-04 DIAGNOSIS — H1010 Acute atopic conjunctivitis, unspecified eye: Secondary | ICD-10-CM | POA: Insufficient documentation

## 2012-05-04 DIAGNOSIS — J309 Allergic rhinitis, unspecified: Secondary | ICD-10-CM | POA: Diagnosis not present

## 2012-05-04 DIAGNOSIS — M549 Dorsalgia, unspecified: Secondary | ICD-10-CM

## 2012-05-04 MED ORDER — LORATADINE 10 MG PO CAPS: 10.0000 mg | ORAL_CAPSULE | Freq: Every day | ORAL | Status: DC

## 2012-05-04 MED ORDER — FLUTICASONE PROPIONATE 50 MCG/ACT NA SUSP: 2.0000 | Freq: Every day | NASAL | Status: DC

## 2012-05-04 NOTE — Progress Notes (Signed)
  Subjective:    Patient ID: [name] [name], female    DOB: 23-Jul-1966, 46 y.o.   MRN: [id number]  HPI  Korissa presents to same day clinic for cough, nasal congestion, and itchy eyes for about a week.  She says that she does normally have seasonal allergies in the spring time, but not this bad.  She says she is coughing up yellow phlegm and is having difficulty getting congestion out of her nose.  No fevers/chills/headaches.    The patient also states she is getting ready to go on a 2 month trip, and is asking for refills on her narcotics. She was in the hospital in April and has not yet seen her PCP for a regularly scheduled follow up office visit.   Review of Systems Pertinent items in HPI.     Objective:   Physical Exam BP 92/61  Pulse 80  Temp(Src) 98.4 F (36.9 C) (Oral)  Ht 5\' 1"  (1.549 m)  Wt 163 lb (73.936 kg)  BMI 30.80 kg/m2 General appearance: alert, cooperative and no distress Head: Normocephalic, without obvious abnormality, atraumatic, sinuses nontender to percussion Eyes: PERRL, EOMIT Ears: normal TM's and external ear canals both ears Nose: turbinates red, swollen Throat: lips, mucosa, and tongue normal; teeth and gums normal Neck: no adenopathy, supple, symmetrical, trachea midline and thyroid not enlarged, symmetric, no tenderness/mass/nodules Lungs: clear to auscultation bilaterally Heart: regular rate and rhythm, S1, S2 normal, no murmur, click, rub or gallop       Assessment & Plan:

## 2012-05-04 NOTE — Assessment & Plan Note (Signed)
Patient requesting refill on chronic narcotics in cross cover clinic.  Discussed that clinic policy requires patients to have a regularly scheduled visit with their primary care provider to have chronic controlled substances refilled. I discussed with Dr. Lindell Noe who was in agreement that this situation did not warrant breaking of this policy.  Asked patient to make appointment with PCP.

## 2012-05-04 NOTE — Assessment & Plan Note (Signed)
Reassured no bacterial infection.  Advised nasal saline washes, Rx for loratadine and Flonase.

## 2012-05-04 NOTE — Patient Instructions (Signed)
I'm sorry you are not feeling well.  I think this cough and congestion is from your allergies.  I want you to start taking loratadine (Claritin) daily, as well as flonase nasal spray to help with the allergies.  You can also try nasal saline washes to help with the congestion.   Our clinic policy is that you have to have a regularly scheduled appointment with your primary doctor for refills on controlled substances (ie narcotic pain medications and benzodiazepine anxiety medications).  Please ask the front desk on the way out if Dr. Charlett Blake has a clinic opening in the near future.

## 2012-05-10 ENCOUNTER — Encounter: Payer: Self-pay | Admitting: Family Medicine

## 2012-05-10 ENCOUNTER — Ambulatory Visit (INDEPENDENT_AMBULATORY_CARE_PROVIDER_SITE_OTHER): Payer: Medicare Other | Admitting: Family Medicine

## 2012-05-10 VITALS — BP 134/82 | HR 78 | Temp 98.4°F | Ht 61.0 in | Wt 164.0 lb

## 2012-05-10 DIAGNOSIS — R252 Cramp and spasm: Secondary | ICD-10-CM

## 2012-05-10 DIAGNOSIS — M549 Dorsalgia, unspecified: Secondary | ICD-10-CM

## 2012-05-10 DIAGNOSIS — M25519 Pain in unspecified shoulder: Secondary | ICD-10-CM | POA: Diagnosis not present

## 2012-05-10 DIAGNOSIS — M25511 Pain in right shoulder: Secondary | ICD-10-CM

## 2012-05-10 MED ORDER — OXYCODONE HCL 10 MG PO TABS: 10.0000 mg | ORAL_TABLET | Freq: Two times a day (BID) | ORAL | Status: DC | PRN

## 2012-05-10 MED ORDER — PROMETHAZINE HCL 25 MG PO TABS: 25.0000 mg | ORAL_TABLET | ORAL | Status: DC | PRN

## 2012-05-10 MED ORDER — LORAZEPAM 1 MG PO TABS: 1.0000 mg | ORAL_TABLET | ORAL | Status: DC | PRN

## 2012-05-10 NOTE — Assessment & Plan Note (Signed)
Gave 3 months of oxycodone refills. This helps her be more active and she is able to do things throughout the day.

## 2012-05-10 NOTE — Progress Notes (Signed)
  Subjective:    Patient ID: [name] [name], female    DOB: 11-15-1966, 46 y.o.   MRN: [id number]  HPI  Back pain-she has been taking oxycodone twice a day for back pain. This helps her the patient do the activities she needs to do through the day including take care of her granddaughter with lifting and bending. She understands that we will not increase the dose. She does occasionally uses medicine when she gets abdominal pain from her cyclic vomiting.  Congestion-she was seen one week ago for congestion and given Flonase and Claritin. She is taking both of those. She is not on a nasal saline. She thinks is a little better but is still bothering her a lot.  Foot cramps-she gets these intermittently at night and they wake her from sleep. She's had one last 2 nights. These were used to go away in 10-15 minutes. She has not tried anything for them except massage. They're the same as they were previously and they're now more frequent.  Cyclic vomiting fairly well-controlled. Review of Systems Denies CP, SOB, HA, N/V/D, fever     Objective:   Physical Exam  Vital signs reviewed General appearance - alert, well appearing, and in no distress Heart - normal rate, regular rhythm, normal S1, S2, no murmurs, rubs, clicks or gallops Chest - clear to auscultation, no wheezes, rales or rhonchi, symmetric air entry, no tachypnea, retractions or cyanosis Abdomen - soft, nontender, nondistended, no masses or organomegaly Extremities - peripheral pulses normal, no pedal edema, no clubbing or cyanosis       Assessment & Plan:

## 2012-05-10 NOTE — Assessment & Plan Note (Signed)
Right-sided shoulder pain without change. Called PT to schedule her appointment. She is to do exercises and come back for followup.

## 2012-05-10 NOTE — Patient Instructions (Signed)
For the allergies- please get over the counter nasal saline to spray often through the day  I have refilled 3 months of oxycodone and ativan  Good luck on your trip

## 2012-05-10 NOTE — Assessment & Plan Note (Signed)
The cramps have not changed. We discussed possible over-the-counter remedies including mustard. She will try this and report back.

## 2012-05-17 ENCOUNTER — Ambulatory Visit: Payer: Medicare Other | Attending: Family Medicine | Admitting: Rehabilitative and Restorative Service Providers"

## 2012-06-06 ENCOUNTER — Encounter: Payer: Self-pay | Admitting: Family Medicine

## 2012-06-12 ENCOUNTER — Telehealth: Payer: Self-pay | Admitting: *Deleted

## 2012-06-12 ENCOUNTER — Telehealth: Payer: Self-pay | Admitting: Family Medicine

## 2012-06-12 NOTE — Telephone Encounter (Signed)
[facility] calling requesting a new RX be written for [name] [name]'s Oxycodone 10 mg.   Carson says she gave the pharmacy the RX for June but they do not have it and patient does not have it.  This Rx was last filled on 05/10/2012.  This is the only pharmacy [name] gets this medication filled at.  Need Dr. Charlett Blake to rewrite the prescription.  Call [name] when ready and she will come by and pick up.  [name]

## 2012-06-12 NOTE — Telephone Encounter (Signed)
Spoke with patient pharmacy they state they do not have a rx on fill for patient for June however patient states she turned in rx for June when she filled rx for May. Will forward to MD

## 2012-06-12 NOTE — Telephone Encounter (Signed)
Pt states that she had taken scripts for her pain meds to pharmacy last month and they had filled 25 and kept the June script b/c she was going out of town.  She went to get the refill for this month and they are telling her that they don't have it.  She is very upset.  She has the one for July but is not sure what to do about this month.   [facility]

## 2012-06-14 MED ORDER — OXYCODONE HCL 10 MG PO TABS: 10.0000 mg | ORAL_TABLET | Freq: Two times a day (BID) | ORAL | Status: DC | PRN

## 2012-06-14 NOTE — Telephone Encounter (Signed)
Printed prescription. Gave to front desk.

## 2012-06-19 ENCOUNTER — Telehealth: Payer: Self-pay | Admitting: *Deleted

## 2012-06-19 NOTE — Telephone Encounter (Signed)
Called patient and she states she picked up oxycodone Rx last week.  Patient has switched her pharmacy to [facility] [address] and had oxycodone filled.  [facility].  [facility] has received transfer [facility].   Nolene Ebbs, RN

## 2012-06-19 NOTE — Telephone Encounter (Signed)
This has already been addressed.  The June script was rewritten and given to [place].  The patient should have it.

## 2012-06-19 NOTE — Telephone Encounter (Signed)
[facility] about [facility]one Rx.  Patient states on May 15 she had dropped her prescriptions off at pharmacy.  Patient picked up oxycodone for May, but pharmacy does not have June Rx on file.  Not sure if they misplaced it.  Calling to verify that patient was seen on May 15.  Informed pharmacy that patient had office visit on May 15 and was given refills for 3 months per office note.  Will see if Dr. Charlett Blake can write another Rx for patient to pick up.  Nolene Ebbs, RN

## 2012-07-03 ENCOUNTER — Other Ambulatory Visit: Payer: Self-pay | Admitting: Family Medicine

## 2012-07-07 ENCOUNTER — Other Ambulatory Visit: Payer: Self-pay | Admitting: Family Medicine

## 2012-08-07 ENCOUNTER — Other Ambulatory Visit: Payer: Self-pay | Admitting: *Deleted

## 2012-08-07 NOTE — Telephone Encounter (Signed)
Pt is calling because she will be out of her pain meds and did not realize that Dr. Otilio Carpen would be leaving prior to her next appt. She has an appt scheduled for 8.22 but will run out of her pain meds before then and is asking if she can get an RX to get her through until then.[name] [name] [place]

## 2012-08-08 ENCOUNTER — Other Ambulatory Visit: Payer: Self-pay | Admitting: Family Medicine

## 2012-08-08 MED ORDER — OXYCODONE HCL 10 MG PO TB12: 10.0000 mg | ORAL_TABLET | Freq: Two times a day (BID) | ORAL | Status: DC

## 2012-08-08 NOTE — Telephone Encounter (Signed)
She should have enough until August 15.   -Please have her pick up Rx tomorrow afternoon around 1:30 pm for another week's worth -Please also tell her next time, she will need to schedule her appointment BEFORE medications run out  Thank you

## 2012-08-08 NOTE — Telephone Encounter (Signed)
Spoke with pt's sister and informed her that she can come p/u the RX after 130 pm TOMORROW. She will relay this message to her sister.[name] [name]

## 2012-08-14 ENCOUNTER — Other Ambulatory Visit: Payer: Self-pay | Admitting: Family Medicine

## 2012-08-17 ENCOUNTER — Encounter: Payer: Self-pay | Admitting: Family Medicine

## 2012-08-17 ENCOUNTER — Ambulatory Visit (INDEPENDENT_AMBULATORY_CARE_PROVIDER_SITE_OTHER): Payer: Medicare Other | Admitting: Family Medicine

## 2012-08-17 VITALS — BP 130/74 | HR 74 | Temp 98.3°F | Ht 61.0 in | Wt 158.0 lb

## 2012-08-17 DIAGNOSIS — E1329 Other specified diabetes mellitus with other diabetic kidney complication: Secondary | ICD-10-CM

## 2012-08-17 DIAGNOSIS — F339 Major depressive disorder, recurrent, unspecified: Secondary | ICD-10-CM | POA: Diagnosis not present

## 2012-08-17 DIAGNOSIS — R1115 Cyclical vomiting syndrome unrelated to migraine: Secondary | ICD-10-CM

## 2012-08-17 DIAGNOSIS — E1165 Type 2 diabetes mellitus with hyperglycemia: Secondary | ICD-10-CM | POA: Diagnosis not present

## 2012-08-17 DIAGNOSIS — N058 Unspecified nephritic syndrome with other morphologic changes: Secondary | ICD-10-CM | POA: Diagnosis not present

## 2012-08-17 DIAGNOSIS — N183 Chronic kidney disease, stage 3 unspecified: Secondary | ICD-10-CM | POA: Diagnosis not present

## 2012-08-17 DIAGNOSIS — I10 Essential (primary) hypertension: Secondary | ICD-10-CM | POA: Diagnosis not present

## 2012-08-17 DIAGNOSIS — E118 Type 2 diabetes mellitus with unspecified complications: Secondary | ICD-10-CM

## 2012-08-17 DIAGNOSIS — F430 Acute stress reaction: Secondary | ICD-10-CM | POA: Diagnosis not present

## 2012-08-17 DIAGNOSIS — E1322 Other specified diabetes mellitus with diabetic chronic kidney disease: Secondary | ICD-10-CM

## 2012-08-17 DIAGNOSIS — Z658 Other specified problems related to psychosocial circumstances: Secondary | ICD-10-CM

## 2012-08-17 MED ORDER — LORAZEPAM 1 MG PO TABS: 1.0000 mg | ORAL_TABLET | ORAL | Status: DC | PRN

## 2012-08-17 MED ORDER — INSULIN GLARGINE 100 UNIT/ML ~~LOC~~ SOLN: 22.0000 [IU] | Freq: Every day | SUBCUTANEOUS | Status: DC

## 2012-08-17 MED ORDER — LISINOPRIL 40 MG PO TABS: 40.0000 mg | ORAL_TABLET | Freq: Every day | ORAL | Status: DC

## 2012-08-17 MED ORDER — OXYCODONE HCL 10 MG PO TABS: 10.0000 mg | ORAL_TABLET | Freq: Two times a day (BID) | ORAL | Status: DC | PRN

## 2012-08-17 MED ORDER — METOCLOPRAMIDE HCL 10 MG PO TABS: 10.0000 mg | ORAL_TABLET | Freq: Four times a day (QID) | ORAL | Status: DC | PRN

## 2012-08-17 MED ORDER — CARVEDILOL 25 MG PO TABS: 25.0000 mg | ORAL_TABLET | Freq: Two times a day (BID) | ORAL | Status: DC

## 2012-08-17 MED ORDER — INSULIN ASPART 100 UNIT/ML ~~LOC~~ SOLN: SUBCUTANEOUS | Status: DC

## 2012-08-17 MED ORDER — PROMETHAZINE HCL 25 MG PO TABS: 25.0000 mg | ORAL_TABLET | ORAL | Status: DC | PRN

## 2012-08-17 MED ORDER — SUCRALFATE 1 G PO TABS: 1.0000 g | ORAL_TABLET | Freq: Four times a day (QID) | ORAL | Status: DC

## 2012-08-17 NOTE — Progress Notes (Signed)
  Subjective:    Patient ID: [name] [name], female    DOB: 1966-08-02, 46 y.o.   MRN: [id number]  HPI # History of cyclical vomiting, started having nausea with 1 episode of vomiting after dental procedure this morning  Vomiting resolved after taking 1 Ativan and 1 Phenergan She still feels nauseous  Last smoked marijuana 7 weeks ago   # Stressors, history of depression  Her relationship with boyfriend of 10 years has been rocky past 3 months  She is also having difficult time keeping up with bills with her money from disability   Review of Systems Per HPI Denies fevers/chills Denies constipation   Allergies, medication, past medical history reviewed.  Significant for: -Poorly controlled T2DM on insulin -Marijuana use -Depression, anxiety -HTN    Objective:   Physical Exam GEN: NAD PSYCH: appears depressed; tearful at times CV: RRR PULM: NI WOB  ABD: NABS, soft, NT, ND    Assessment & Plan:

## 2012-08-17 NOTE — Assessment & Plan Note (Signed)
She has history of significant life stressors and still continues to have them. Currently she has problems with finances and with her boyfriend. Follow-up in 2 weeks. Consider starting anti-depressant at that time; in the past, she did not tolerate Remeron and refused Cymbalta (due to personal concern for nausea). Do not want to add any other medication today with current exacerbation of cyclical vomiting.

## 2012-08-17 NOTE — Assessment & Plan Note (Deleted)
May be depressed as well. She has problems with finances and with her boyfriend. Her faith helps her. Follow-up in 2 weeks. Consider starting anti-depressant at that time. Do not want to add any other medication today with current exacerbation of cyclical vomiting.

## 2012-08-17 NOTE — Assessment & Plan Note (Signed)
She feels an exacerbation starting to occur after dental procedure this morning.  She has had 1 episode of vomiting that resolved after Phenergan and Ativan x 1.  -Advised to take both medication scheduled for today then as needed -Start Reglan (history of moderate delay in gastric emptying 03/2011 on gastric emptying study) -Stay hydrated  -Follow-up tomorrow

## 2012-08-17 NOTE — Patient Instructions (Addendum)
For your nausea: -Take Phenergan every 4-6 hours today (then as needed tomorrow) -Take Ativan every 4-6 hours today (then as needed tomorrow) -Try Reglan every 6 hours today (then as needed tomorrow) -Follow-up tomorrow  For your diabetes: -Please check blood sugars in the morning AND if you can after your biggest meal -Bring me a list of your blood sugars -Follow-up in 2 weeks  God bless you [name] [name].

## 2012-08-17 NOTE — Assessment & Plan Note (Signed)
Documentation only. Deteriorated. Advised to follow-up in 2 weeks to discuss. Advised check CBG 1-2 times day and bring log with her. She has not been checking sugars regularly. Last she checked was 1 month ago and it was in the 140s.

## 2012-08-18 ENCOUNTER — Ambulatory Visit: Payer: Medicare Other | Admitting: Family Medicine

## 2012-08-31 ENCOUNTER — Ambulatory Visit (INDEPENDENT_AMBULATORY_CARE_PROVIDER_SITE_OTHER): Payer: Medicare Other | Admitting: Family Medicine

## 2012-08-31 ENCOUNTER — Encounter: Payer: Self-pay | Admitting: Family Medicine

## 2012-08-31 VITALS — BP 126/81 | HR 63 | Temp 98.2°F | Ht 61.0 in | Wt 161.0 lb

## 2012-08-31 DIAGNOSIS — E1165 Type 2 diabetes mellitus with hyperglycemia: Secondary | ICD-10-CM | POA: Diagnosis not present

## 2012-08-31 DIAGNOSIS — Z609 Problem related to social environment, unspecified: Secondary | ICD-10-CM

## 2012-08-31 DIAGNOSIS — Z7289 Other problems related to lifestyle: Secondary | ICD-10-CM | POA: Diagnosis not present

## 2012-08-31 DIAGNOSIS — E118 Type 2 diabetes mellitus with unspecified complications: Secondary | ICD-10-CM

## 2012-08-31 HISTORY — DX: Problem related to social environment, unspecified: Z60.9

## 2012-08-31 NOTE — Patient Instructions (Addendum)
How to increase Lantus: -Take 8 units today -Check your sugar tomorrow morning, if > 200, take 30 units -Check your sugar Saturday morning, if > 200, take 35 units -Check your sugar Sunday morning, if > 200, take 40 units  Follow-up in 1 week

## 2012-08-31 NOTE — Progress Notes (Signed)
  Subjective:    Patient ID: [name] [name], female    DOB: 07-Feb-1966, 46 y.o.   MRN: [id number]  HPI # Diabetes She checks her sugars sometimes at home.    AM: usually 360s   After biggest meal: recently 480s She did not bring log of sugars because the numbers were high She reports compliance with Lantus She only takes Novolog once a day because she eats about one time a day. She is having trouble getting her food stamps and is having trouble paying for food  Review of Systems Denies hypoglycemia Denies lightheadedness She feels very stressed and overwhelmed right now.  Allergies, medication, past medical history reviewed.     Objective:   Physical Exam GEN: NAD; she is here with granddaughter who she takes care of during day PSYCH: appears very stressed and anxious; she was tearful for most of interview    Assessment & Plan:

## 2012-08-31 NOTE — Assessment & Plan Note (Signed)
[name] [name] spoke with the patient today, and she is working with the patient on getting her food stamps

## 2012-08-31 NOTE — Assessment & Plan Note (Addendum)
Will increase Lantus from 22 to 30 units and patient given instructions on titrating up to 40 units (see AVS). Follow-up in 1 week.

## 2012-09-01 ENCOUNTER — Telehealth: Payer: Self-pay | Admitting: *Deleted

## 2012-09-01 NOTE — Telephone Encounter (Signed)
Patient declined services:  Yes  No   Primary Presenting Issue: patient has a HCC score of greater than 6 with several chronic illnesses accompanying including diabetes with elevated hgA1c.  patient states her daily blood sugars run in the 200-300s because she is unable to afford food to eat in the evening hours, so she is not taking her evening insulin dose.  patient states she has not received her EBT benefits since June 2013, is ineligible for food from the church food back until January.  she states she gets $540 per month, most of which goes for rent, utilities, car insurance and up keep.  patient states she has been to [place] to update information for eligibility.   Interventions:  ToDo sent through [employer] to [employer], LCSW to assist with finding a food voucher for immediate use.   Has issue been resolved:  Yes  No- pending  on resource availability.  Hunt Oris, LCSW Family Practice,  has also seen patient today to assist with as she can.     Secondary Presenting Issue: deffered due to urgency of #1. patient also has limited minutes on her phone and was unable to talk long.    Interventions: Has issue been resolved:  Yes  No   Additional Comments:           Erenest Rasher, RN-BC, M.Ed. [facility] Coordinator [phone] [email or web address]

## 2012-09-05 ENCOUNTER — Telehealth: Payer: Self-pay | Admitting: Clinical

## 2012-09-05 NOTE — Telephone Encounter (Signed)
Clinical Social Worker (CSW) met with pt 08/31/12 10:39am as pt has had challenges with having her food stamps reinstated.  CSW and pt contacted DSS and spoke to a receptionist who stated she would send an email to the director working on pt case Donavan Foil [phone]) as she was unable to reach her by phone.CSW did provide pt with several resources for hot meals/ food pantries. Pt confirmed that her daughter is currently providing her with meals until pt has her food stamps reinstated.  CSW has left 2 messages for pt case worker Grayland Jack [phone] and for Donavan Foil. CSW  informed pt that CSW would contact pt if she was able to reach herDSS caseworker/supervisor. CSW also encouraged pt to go to DSS to follow up in person. Pt appreciative.  Hunt Oris, MSW, [place]

## 2012-09-08 ENCOUNTER — Encounter: Payer: Self-pay | Admitting: Family Medicine

## 2012-09-08 ENCOUNTER — Ambulatory Visit (INDEPENDENT_AMBULATORY_CARE_PROVIDER_SITE_OTHER): Payer: Medicare Other | Admitting: Family Medicine

## 2012-09-08 VITALS — BP 145/69 | HR 69 | Temp 98.1°F | Ht 61.0 in | Wt 163.0 lb

## 2012-09-08 DIAGNOSIS — M25519 Pain in unspecified shoulder: Secondary | ICD-10-CM | POA: Diagnosis not present

## 2012-09-08 DIAGNOSIS — E1329 Other specified diabetes mellitus with other diabetic kidney complication: Secondary | ICD-10-CM

## 2012-09-08 DIAGNOSIS — Z23 Encounter for immunization: Secondary | ICD-10-CM

## 2012-09-08 DIAGNOSIS — E1365 Other specified diabetes mellitus with hyperglycemia: Secondary | ICD-10-CM | POA: Diagnosis not present

## 2012-09-08 DIAGNOSIS — Z7289 Other problems related to lifestyle: Secondary | ICD-10-CM | POA: Diagnosis not present

## 2012-09-08 DIAGNOSIS — R1115 Cyclical vomiting syndrome unrelated to migraine: Secondary | ICD-10-CM | POA: Diagnosis not present

## 2012-09-08 DIAGNOSIS — N183 Chronic kidney disease, stage 3 unspecified: Secondary | ICD-10-CM | POA: Diagnosis not present

## 2012-09-08 DIAGNOSIS — Z609 Problem related to social environment, unspecified: Secondary | ICD-10-CM

## 2012-09-08 DIAGNOSIS — M25511 Pain in right shoulder: Secondary | ICD-10-CM

## 2012-09-08 DIAGNOSIS — F17200 Nicotine dependence, unspecified, uncomplicated: Secondary | ICD-10-CM

## 2012-09-08 DIAGNOSIS — N058 Unspecified nephritic syndrome with other morphologic changes: Secondary | ICD-10-CM | POA: Diagnosis not present

## 2012-09-08 DIAGNOSIS — E1322 Other specified diabetes mellitus with diabetic chronic kidney disease: Secondary | ICD-10-CM

## 2012-09-08 MED ORDER — OXYCODONE HCL 10 MG PO TABS: 10.0000 mg | ORAL_TABLET | ORAL | Status: DC | PRN

## 2012-09-08 NOTE — Assessment & Plan Note (Signed)
She may have rotator cuff tendinopathy.  -Joint injection given today -Will increase oxycodone 10 from q6 to q4 prn. Her last Rx was given 08/22 for 60 tablets. I notified her I will decrease dose to regular dose after current Rx. She was given Rx for 60 tablets.

## 2012-09-08 NOTE — Assessment & Plan Note (Signed)
Documentation only. She had been taking Phenergan and Ativan daily for prophylaxis. Discussed she should only take prn.

## 2012-09-08 NOTE — Progress Notes (Signed)
  Subjective:    Patient ID: [name] [name], female    DOB: Aug 19, 1966, 46 y.o.   MRN: [id number]  HPI # T2DM, on insulin Went up to Lantus 30 units. Her sugars have been 110-190s. One low of 63 that went up appropriately with eating something. She is eating 2-3 times a day  # High risk social situation She still has not been able to get foods stamps and has not heard from anyone regarding this  # Right shoulder pain She hurt it several months ago playing Wii bowling and it still bothers her ROS: sometimes tingles; denies numbness or weakness  Review of Systems Per HPI  Allergies, medication, past medical history reviewed.  Significant for:  -Poorly controlled T2DM on insulin  -Cyclical vomiting syndrome -Marijuana use  -Depression, anxiety  -HTN -High risk social situation--due to medical problems; disability     Objective:   Physical Exam GEN: NAD CV: RRR PULM: NI WOB MSK: RIGHT SHOULDER: tenderness to palpation deltoids and bicipital groove; pain with active internal rotation but not external rotation; passive abduction intact; positive Hawkins-Kennedy  Procedure: RIGHT SHOULDER steroid injection Informed consent granted.  Area prepped with betadine swabs x 2 and alcohol swabs x 2. Knee joint was approached laterally and steroid injection performed using 4 mL of 1% plain lidocaine without epinephrine and 40 mg of Solu-Medrol.  Patient tolerated the procedure well. Hemostasis achieved immediately.  Site of injection covered with band-aid.     Assessment & Plan:

## 2012-09-08 NOTE — Assessment & Plan Note (Signed)
Social Worker Normal has been communicating and working with DSS, however, patient still has not been able to receive food stamps.

## 2012-09-08 NOTE — Assessment & Plan Note (Signed)
Better control on Lantus 30.

## 2012-09-08 NOTE — Patient Instructions (Addendum)
Follow-up on September 16 or 23 (they are both Mondays), at a morning time so we can check your cholesterol  If your sugars are < 80 more than once a week, call the clinic and let me know. We may need to back down on your Lantus.  Flu shot today  Joint injection for your shoulder. Continue to move the shoulder around. If the pain gets worse, then follow-up with me.

## 2012-09-12 ENCOUNTER — Ambulatory Visit (INDEPENDENT_AMBULATORY_CARE_PROVIDER_SITE_OTHER): Payer: Medicare Other | Admitting: Family Medicine

## 2012-09-12 VITALS — BP 130/76 | HR 73 | Temp 97.5°F | Ht 61.0 in | Wt 159.0 lb

## 2012-09-12 DIAGNOSIS — N183 Chronic kidney disease, stage 3 unspecified: Secondary | ICD-10-CM

## 2012-09-12 DIAGNOSIS — R112 Nausea with vomiting, unspecified: Secondary | ICD-10-CM

## 2012-09-12 DIAGNOSIS — N058 Unspecified nephritic syndrome with other morphologic changes: Secondary | ICD-10-CM

## 2012-09-12 DIAGNOSIS — E1322 Other specified diabetes mellitus with diabetic chronic kidney disease: Secondary | ICD-10-CM

## 2012-09-12 DIAGNOSIS — R109 Unspecified abdominal pain: Secondary | ICD-10-CM

## 2012-09-12 DIAGNOSIS — R1115 Cyclical vomiting syndrome unrelated to migraine: Secondary | ICD-10-CM | POA: Diagnosis not present

## 2012-09-12 DIAGNOSIS — E1365 Other specified diabetes mellitus with hyperglycemia: Secondary | ICD-10-CM

## 2012-09-12 DIAGNOSIS — E1329 Other specified diabetes mellitus with other diabetic kidney complication: Secondary | ICD-10-CM

## 2012-09-12 LAB — GLUCOSE, CAPILLARY: Glucose-Capillary: 149 mg/dL — ABNORMAL HIGH (ref 70–99)

## 2012-09-12 MED ORDER — PROMETHAZINE HCL 25 MG/ML IJ SOLN
25.0000 mg | Freq: Once | INTRAMUSCULAR | Status: AC
  Administered 2012-09-12: 25 mg via INTRAMUSCULAR

## 2012-09-12 MED ORDER — LORAZEPAM 2 MG/ML IJ SOLN
1.0000 mg | Freq: Once | INTRAMUSCULAR | Status: AC
  Administered 2012-09-12: 1 mg via INTRAMUSCULAR

## 2012-09-12 MED ORDER — MORPHINE SULFATE 4 MG/ML IJ SOLN
4.0000 mg | Freq: Once | INTRAMUSCULAR | Status: AC
  Administered 2012-09-12: 4 mg via INTRAMUSCULAR

## 2012-09-12 NOTE — Patient Instructions (Signed)
I am glad you have been doing so well for so long. Hopefully we can avoid a hospital admission with your shots today (Morphine for pain, phenergan for nausea, ativan for anxiety/calming effects). I would encourage you to start taking the phenergan you have at home about 6 hours after your shot to try to stay ahead of the nausea. Try to stay hydrated and focus on liquids mainly today. For now, you do not appear dehydrated but you know per your history when you need to come to the hospital. I think these medicines will help you and keep you out of the hospital. Try to at least take your blood pressure medicine when you get home.

## 2012-09-13 NOTE — Assessment & Plan Note (Addendum)
Given IM doses of Morphine, Phenergan, and ativan with improvement in symptoms before discharge. No hypo or hyperglycemia noted. BP improved from SBP >170 on recheck to 130. Patient well aware of reasons she needs to come to hospital and history of many previous admissions. ENcouraged PO fluids, fortunately, patient did not appear dehydrated today.   Patient has pain meds and antiemetics at home. Encouraged antiemetic use at regularly scheduled intervals.

## 2012-09-13 NOTE — Progress Notes (Signed)
  Subjective:    Patient ID: [name] [name], female    DOB: Jun 30, 1966, 46 y.o.   MRN: [id number]  HPI  1. Cyclical vomiting flare-patient reports that she woke up this morning with terribel nausea and abdominal pain. Patient stopped taking phenergan and ativan prophylactically several days ago and she is concerned this may have triggered symptoms. SHe has otherwise been in good health. Current symptoms consistent with previous flares with epigastric pain rated 10/10 and nausea/vomiting. nonbilious nonbloody emesis x3 today and has not been able to keep any food down. Did not check CBG at home but in office approximately 150 today. ROS does not show any signs of infection as triggers.    Review of Systems -See HPI  Past Medical History-smoking status noted: occasional marijuana use which is intermittently denied.  Reviewed problem list.  Medications- reviewed and updated Chief complaint-noted    Objective:   Physical Exam  Constitutional: She is oriented to person, place, and time. She appears well-developed and well-nourished. She appears distressed (intermittently crying due to pain).  HENT:       Moist mucus membranes. < 2 second capillary refill.   Cardiovascular: Normal rate and regular rhythm.  Exam reveals no gallop and no friction rub.   No murmur heard. Pulmonary/Chest: Effort normal and breath sounds normal. She has no wheezes. She has no rales.  Abdominal: Soft. Bowel sounds are normal. She exhibits no distension. There is tenderness (diffuse but worse in epigastric region). There is no rebound and no guarding.  Musculoskeletal: Normal range of motion. She exhibits no edema.  Neurological: She is alert and oriented to person, place, and time.  Skin: Skin is warm and dry. She is not diaphoretic.  BP 130/76  Pulse 73  Temp 97.5 F (36.4 C) (Oral)  Ht 5\' 1"  (1.549 m)  Wt 159 lb (72.122 kg)  BMI 30.04 kg/m2    Assessment & Plan:

## 2012-09-14 ENCOUNTER — Ambulatory Visit (INDEPENDENT_AMBULATORY_CARE_PROVIDER_SITE_OTHER): Payer: Medicare Other | Admitting: Family Medicine

## 2012-09-14 ENCOUNTER — Emergency Department (HOSPITAL_COMMUNITY)
Admission: EM | Admit: 2012-09-14 | Discharge: 2012-09-14 | Disposition: A | Discharge status: Home / Self Care | Payer: Medicare Other | Attending: Emergency Medicine | Admitting: Emergency Medicine

## 2012-09-14 ENCOUNTER — Encounter (HOSPITAL_COMMUNITY): Payer: Self-pay | Admitting: *Deleted

## 2012-09-14 ENCOUNTER — Encounter: Payer: Self-pay | Admitting: Family Medicine

## 2012-09-14 VITALS — BP 150/88 | HR 104 | Ht 61.0 in | Wt 158.0 lb

## 2012-09-14 DIAGNOSIS — R1115 Cyclical vomiting syndrome unrelated to migraine: Secondary | ICD-10-CM | POA: Insufficient documentation

## 2012-09-14 DIAGNOSIS — R1013 Epigastric pain: Secondary | ICD-10-CM | POA: Insufficient documentation

## 2012-09-14 DIAGNOSIS — Z79899 Other long term (current) drug therapy: Secondary | ICD-10-CM | POA: Diagnosis not present

## 2012-09-14 DIAGNOSIS — Z794 Long term (current) use of insulin: Secondary | ICD-10-CM | POA: Insufficient documentation

## 2012-09-14 DIAGNOSIS — K219 Gastro-esophageal reflux disease without esophagitis: Secondary | ICD-10-CM | POA: Insufficient documentation

## 2012-09-14 DIAGNOSIS — E119 Type 2 diabetes mellitus without complications: Secondary | ICD-10-CM | POA: Diagnosis not present

## 2012-09-14 DIAGNOSIS — F329 Major depressive disorder, single episode, unspecified: Secondary | ICD-10-CM | POA: Diagnosis not present

## 2012-09-14 DIAGNOSIS — I10 Essential (primary) hypertension: Secondary | ICD-10-CM | POA: Insufficient documentation

## 2012-09-14 DIAGNOSIS — Z7982 Long term (current) use of aspirin: Secondary | ICD-10-CM | POA: Insufficient documentation

## 2012-09-14 DIAGNOSIS — O210 Mild hyperemesis gravidarum: Secondary | ICD-10-CM | POA: Diagnosis not present

## 2012-09-14 DIAGNOSIS — F3289 Other specified depressive episodes: Secondary | ICD-10-CM | POA: Insufficient documentation

## 2012-09-14 DIAGNOSIS — F411 Generalized anxiety disorder: Secondary | ICD-10-CM | POA: Diagnosis not present

## 2012-09-14 DIAGNOSIS — R109 Unspecified abdominal pain: Secondary | ICD-10-CM | POA: Diagnosis not present

## 2012-09-14 DIAGNOSIS — R1110 Vomiting, unspecified: Secondary | ICD-10-CM

## 2012-09-14 DIAGNOSIS — R112 Nausea with vomiting, unspecified: Secondary | ICD-10-CM | POA: Diagnosis not present

## 2012-09-14 LAB — CBC WITH DIFFERENTIAL/PLATELET
Basophils Absolute: 0 10*3/uL (ref 0.0–0.1)
Eosinophils Relative: 3 % (ref 0–5)
Lymphocytes Relative: 40 % (ref 12–46)
Neutro Abs: 3.2 10*3/uL (ref 1.7–7.7)
Platelets: 212 10*3/uL (ref 150–400)
RDW: 13.4 % (ref 11.5–15.5)
WBC: 6.8 10*3/uL (ref 4.0–10.5)

## 2012-09-14 LAB — URINALYSIS, ROUTINE W REFLEX MICROSCOPIC
Leukocytes, UA: NEGATIVE
Nitrite: NEGATIVE
Protein, ur: 100 mg/dL — AB
Urobilinogen, UA: 1 mg/dL (ref 0.0–1.0)

## 2012-09-14 LAB — COMPREHENSIVE METABOLIC PANEL
ALT: 21 U/L (ref 0–35)
AST: 32 U/L (ref 0–37)
Albumin: 4.3 g/dL (ref 3.5–5.2)
CO2: 25 mEq/L (ref 19–32)
Calcium: 10.5 mg/dL (ref 8.4–10.5)
Chloride: 101 mEq/L (ref 96–112)
GFR calc non Af Amer: 38 mL/min — ABNORMAL LOW (ref >90)
Sodium: 139 mEq/L (ref 135–145)

## 2012-09-14 MED ORDER — PROMETHAZINE HCL 25 MG/ML IJ SOLN
25.0000 mg | Freq: Once | INTRAMUSCULAR | Status: AC
  Administered 2012-09-14: 25 mg via INTRAMUSCULAR

## 2012-09-14 MED ORDER — LORAZEPAM 2 MG/ML IJ SOLN
1.0000 mg | Freq: Once | INTRAMUSCULAR | Status: AC
  Administered 2012-09-14: 1 mg via INTRAVENOUS
  Filled 2012-09-14: qty 1

## 2012-09-14 MED ORDER — HYDROMORPHONE HCL PF 2 MG/ML IJ SOLN
2.0000 mg | Freq: Once | INTRAMUSCULAR | Status: AC
  Administered 2012-09-14: 2 mg via INTRAMUSCULAR
  Filled 2012-09-14: qty 1

## 2012-09-14 MED ORDER — LORAZEPAM 1 MG PO TABS: 1.0000 mg | ORAL_TABLET | Freq: Three times a day (TID) | ORAL | Status: DC | PRN

## 2012-09-14 MED ORDER — MORPHINE SULFATE 10 MG/ML IJ SOLN
4.0000 mg | Freq: Once | INTRAMUSCULAR | Status: AC
  Administered 2012-09-14: 4 mg via INTRAMUSCULAR

## 2012-09-14 MED ORDER — HYDROMORPHONE HCL PF 1 MG/ML IJ SOLN
1.0000 mg | Freq: Once | INTRAMUSCULAR | Status: AC
  Administered 2012-09-14: 1 mg via INTRAMUSCULAR
  Filled 2012-09-14: qty 1

## 2012-09-14 MED ORDER — SODIUM CHLORIDE 0.9 % IV SOLN
INTRAVENOUS | Status: DC
  Administered 2012-09-14: 125 mL/h via INTRAVENOUS
  Administered 2012-09-14: 20:00:00 via INTRAVENOUS

## 2012-09-14 MED ORDER — PROMETHAZINE HCL 25 MG/ML IJ SOLN
25.0000 mg | Freq: Once | INTRAMUSCULAR | Status: AC
  Administered 2012-09-14: 25 mg via INTRAMUSCULAR
  Filled 2012-09-14: qty 1

## 2012-09-14 MED ORDER — ONDANSETRON HCL 4 MG/2ML IJ SOLN
4.0000 mg | Freq: Once | INTRAMUSCULAR | Status: AC
  Administered 2012-09-14: 4 mg via INTRAVENOUS
  Filled 2012-09-14: qty 2

## 2012-09-14 MED ORDER — SODIUM CHLORIDE 0.9 % IJ SOLN: 10.0000 mL | INTRAMUSCULAR | Status: DC | PRN

## 2012-09-14 NOTE — Patient Instructions (Signed)
(  Patient left before I could provide written instructions for her.  I was in another patient's room when she left)

## 2012-09-14 NOTE — Progress Notes (Signed)
Patient ID: [name] [name], female   DOB: Aug 24, 1966, 46 y.o.   MRN: [id number] [name] [name] is a 46 y.o. female who presents to [facility] today for abdominal pain, nausea, and vomiting:  1.  N/V/abdominal pain:  Patient seen here for the same on Monday. She was provided 3 IM injections of Phenergan, morphine, Ativan. She had relief at that time. She is able to eat some broth and drink any fluids on Tuesday. However this morning she woke up at 10 AM with return of her nausea and vomiting. She has been unable to keep down anything besides some ginger ale today. Complains of epigastric pain when vomiting.  Has vomited multiple times today.  No fevers or chills.     The following portions of the patient's history were reviewed and updated as appropriate: allergies, current medications, past medical history, family and social history, and problem list.    Past Medical History  Diagnosis Date  . PANCREATITIS 05/09/2008  . NEUROPATHY, DIABETIC 02/23/2007  . Cyclic vomiting syndrome   . Hypertension   . Complication of anesthesia     "problems waking up"  . Heart murmur   . Myocardial infarction     "they say I've had a silent one; I don't know"  . Pneumonia   . Type II diabetes mellitus   . Anemia   . Blood transfusion   . GERD (gastroesophageal reflux disease)   . Anxiety   . Depression   . Diabetic gastroparesis     /e-chart    ROS as above otherwise neg. No Chest pain, palpitations, SOB, Fever, Chills Medications reviewed. Current Outpatient Prescriptions  Medication Sig Dispense Refill  . ASPIRIN LOW DOSE 81 MG EC tablet TAKE ONE TABLET BY MOUTH ONCE A DAY  120 each  4  . B-D ULTRAFINE III SHORT PEN 31G X 8 MM MISC       . carvedilol (COREG) 25 MG tablet Take 1 tablet (25 mg total) by mouth 2 (two) times daily with a meal.  30 tablet  11  . diclofenac sodium (VOLTAREN) 1 % GEL Apply 1 application topically 4 (four) times daily.  100 g  3  . EPINEPHrine (EPIPEN) 0.3 MG/0.3ML DEVI One  dose Im as needed for severe anaphylaxis.      . fluticasone (FLONASE) 50 MCG/ACT nasal spray Place 2 sprays into the nose daily.  16 g  6  . insulin aspart (NOVOLOG FLEXPEN) 100 UNIT/ML injection 4-8 units before meals  15 mL  10  . insulin glargine (LANTUS) 100 UNIT/ML injection Inject 30 Units into the skin daily.      Marland Kitchen LIDODERM 5 %       . lisinopril (PRINIVIL,ZESTRIL) 40 MG tablet Take 1 tablet (40 mg total) by mouth daily.  30 tablet  11  . Loratadine 10 MG CAPS Take 1 capsule (10 mg total) by mouth daily.  30 each  5  . LORazepam (ATIVAN) 1 MG tablet Take 1 tablet (1 mg total) by mouth every 4 (four) hours as needed. For anxiety  30 tablet  2  . metoCLOPramide (REGLAN) 10 MG tablet Take 1 tablet (10 mg total) by mouth 4 (four) times daily as needed.  20 tablet  0  . nicotine (NICODERM CQ - DOSED IN MG/24 HR) 7 mg/24hr patch Apply 1 patch daily as needed. Remove old patch  30 patch  2  . Oxycodone HCl 10 MG TABS Take 1 tablet (10 mg total) by mouth every 4 (four)  hours as needed.  60 each  0  . PRODIGY INSULIN SYRINGE 31G X 5/16" 0.3 ML MISC       . promethazine (PHENERGAN) 25 MG tablet Take 25 mg by mouth every 6 (six) hours as needed. For nausea      . senna (SENOKOT) 8.6 MG TABS Take 2 tablets (17.2 mg total) by mouth 2 (two) times daily. For constipation  60 each  3  . sucralfate (CARAFATE) 1 G tablet Take 1 tablet (1 g total) by mouth 4 (four) times daily.  120 tablet  3    Exam:  BP 203/107  Pulse 104  Ht 5\' 1"  (1.549 m)  Wt 158 lb (71.668 kg)  BMI 29.85 kg/m2 Gen: Patient pacing around room, crying.  Has full emesis basin on exam table HEENT: EOMI,  MMM.  Producing tears Lungs: CTABL Nl WOB Heart: RRR no MRG Abd: soft/nondistended.  Mildly tender to palpation epigastrum.  Otherwise no tenderness.  No guarding or rebound.   Exts: Non edematous BL  LE, warm and well perfused.   Results for orders placed in visit on 09/12/12 (from the past 72 hour(s))  GLUCOSE, CAPILLARY      Status: Abnormal   Collection Time   09/12/12 10:56 AM      Component Value Range Comment   Glucose-Capillary 149 (*) 70 - 99 mg/dL    Comment 1 MD INFORMED

## 2012-09-14 NOTE — Assessment & Plan Note (Addendum)
Does not want to go to hospital. Provided morphine and phenergan here in clinic.    ** Exam s/p drug administration: Checked on patient several times while she was here in clinic.  Final exam about 45 minutes after administration of Morphine and Phenergan.  Patient much more relaxed.  BP much improved from initial (150/88).  Epigastric pain still present but much reduced.  No further vomiting.  Patient very appreciative and able to go home.  Of note, she did mention that her dog ate her bottle of Oxycodone and she only has 2 left.  I did not offer to refill this since she just had a refill on 9/13.  Will route to PCP to let her know.

## 2012-09-14 NOTE — ED Notes (Signed)
Placed call to IV Therapy

## 2012-09-14 NOTE — ED Provider Notes (Addendum)
History     CSN: [id number]  Arrival date & time 09/14/12  [id number]   First MD Initiated Contact with Patient 09/14/12 1850      Chief Complaint  Patient presents with  . Emesis    (Consider location/radiation/quality/duration/timing/severity/associated sxs/prior treatment) Patient is a 46 y.o. female presenting with vomiting. The history is provided by the patient.  Emesis  Associated symptoms include abdominal pain. Pertinent negatives include no chills, no cough, no diarrhea, no fever and no headaches.   46 year old, female, with diabetes, hypertension, and cyclic vomiting syndrome, presents emergency department complaining of epigastric pain, with nausea, and vomiting, which began today.  She denies diarrhea.  She denies cough, or shortness of breath.  She has not had fevers, or chills.  She denies alcohol use.  She has never had peptic ulcer disease.  Level V caveat applies for severe pain with active vomiting  Past Medical History  Diagnosis Date  . PANCREATITIS 05/09/2008  . NEUROPATHY, DIABETIC 02/23/2007  . Cyclic vomiting syndrome   . Hypertension   . Complication of anesthesia     "problems waking up"  . Heart murmur   . Myocardial infarction     "they say I've had a silent one; I don't know"  . Pneumonia   . Type II diabetes mellitus   . Anemia   . Blood transfusion   . GERD (gastroesophageal reflux disease)   . Anxiety   . Depression   . Diabetic gastroparesis     /e-chart    Past Surgical History  Procedure Date  . Port-a-cath placement ~ 2008    right chest; "poor access; frequent hospitalizations"  . Laparotomy and lysis of adhesions   . Abdominal hysterectomy 02/2002  . Cholecystectomy 1980's    Family History  Problem Relation Age of Onset  . Diabetes type II Mother   . Diabetes Mother   . Hypertension Mother   . Diabetes type II Sister   . Diabetes Sister   . Hypertension Sister   . Hypertension Brother     History  Substance Use Topics  .  Smoking status: Former Smoker -- 0.5 packs/day for 22 years    Types: Cigarettes    Quit date: 02/22/2012  . Smokeless tobacco: Never Used  . Alcohol Use: No    OB History    Grav Para Term Preterm Abortions TAB SAB Ect Mult Living                  Review of Systems  Constitutional: Negative for fever, chills and diaphoresis.  Respiratory: Negative for cough and shortness of breath.   Cardiovascular: Negative for chest pain.  Gastrointestinal: Positive for nausea, vomiting and abdominal pain. Negative for diarrhea.  Genitourinary: Negative for dysuria and hematuria.  Neurological: Negative for headaches.  Psychiatric/Behavioral: Negative for confusion.  All other systems reviewed and are negative.    Allergies  Erythromycin; Acetaminophen; and Oxycodone-acetaminophen  Home Medications   Current Outpatient Rx  Name Route Sig Dispense Refill  . ASPIRIN EC 81 MG PO TBEC Oral Take 81 mg by mouth daily.    Marland Kitchen CARVEDILOL 25 MG PO TABS Oral Take 1 tablet (25 mg total) by mouth 2 (two) times daily with a meal. 30 tablet 11  . DICLOFENAC SODIUM 1 % TD GEL Topical Apply 1 application topically 4 (four) times daily. 100 g 3  . EPINEPHRINE 0.3 MG/0.3ML IJ DEVI  One dose Im as needed for severe anaphylaxis.    Marland Kitchen FLUTICASONE PROPIONATE 50  MCG/ACT NA SUSP Nasal Place 2 sprays into the nose daily. 16 g 6  . INSULIN ASPART 100 UNIT/ML [place] SOLN  4-8 units before meals 15 mL 10  . INSULIN GLARGINE 100 UNIT/ML [place] SOLN Subcutaneous Inject 30 Units into the skin daily.    Marland Kitchen LIDODERM 5 % EX PTCH      . LISINOPRIL 40 MG PO TABS Oral Take 1 tablet (40 mg total) by mouth daily. 30 tablet 11  . LORATADINE 10 MG PO CAPS Oral Take 1 capsule (10 mg total) by mouth daily. 30 each 5  . LORAZEPAM 1 MG PO TABS Oral Take 1 tablet (1 mg total) by mouth every 4 (four) hours as needed. For anxiety 30 tablet 2  . NICOTINE 7 MG/24HR TD PT24  Apply 1 patch daily as needed. Remove old patch 30 patch 2  . OXYCODONE  HCL 10 MG PO TABS Oral Take 1 tablet (10 mg total) by mouth every 4 (four) hours as needed. 60 each 0  . PROMETHAZINE HCL 25 MG PO TABS Oral Take 25 mg by mouth every 6 (six) hours as needed. For nausea    . SENNA 8.6 MG PO TABS Oral Take 2 tablets (17.2 mg total) by mouth 2 (two) times daily. For constipation 60 each 3  . SUCRALFATE 1 G PO TABS Oral Take 1 tablet (1 g total) by mouth 4 (four) times daily. 120 tablet 3    RX SIG: TAKE 1 TABLET BY MOUTH FOUR TIMES A DAY(WI ...  . METOCLOPRAMIDE HCL 10 MG PO TABS Oral Take 1 tablet (10 mg total) by mouth 4 (four) times daily as needed. 20 tablet 0    BP 163/94  Pulse 90  Temp 98.8 F (37.1 C) (Oral)  Resp 20  SpO2 100%  Physical Exam  Nursing note and vitals reviewed. Constitutional: She is oriented to person, place, and time. She appears well-developed and well-nourished. She appears distressed.       Crying actively vomiting  HENT:  Head: Normocephalic and atraumatic.  Eyes: Conjunctivae normal and EOM are normal.  Neck: Normal range of motion. Neck supple.  Cardiovascular: Normal rate, regular rhythm and intact distal pulses.   No murmur heard. Pulmonary/Chest: Effort normal and breath sounds normal. She has no rales.  Abdominal: Soft. There is tenderness. There is guarding. There is no rebound.       Epigastric tenderness, and guarding  Musculoskeletal: Normal range of motion.  Neurological: She is alert and oriented to person, place, and time.  Skin: Skin is warm and dry.  Psychiatric: Thought content normal.       Crying in pain    ED Course  Procedures (including critical care time) 46 year old, female, with cyclic vomiting syndrome, presents to the emergency department with epigastric pain, and vomiting.  Today.  She is crying in pain and has abdominal tenderness.  We'll perform laboratory testing.  Establish an IV and give her antiemetics, and analgesics.   Labs Reviewed  URINALYSIS, ROUTINE W REFLEX MICROSCOPIC  CBC  WITH DIFFERENTIAL  COMPREHENSIVE METABOLIC PANEL  LIPASE, BLOOD   No results found.   No diagnosis found.  9:29 PM Feels better but still has pain and nausea.  Says ativan is helpful.   10:28 PM sxs controlled   MDM  Abdominal pain, with hyperemesis        Barbara Cower, MD 09/14/12 1918  Barbara Cower, MD 09/14/12 2228

## 2012-09-14 NOTE — ED Notes (Signed)
The pt is c/o abb and vomting all day .  She saw her doctor this afternoon and was given 2 shots that has not helped.  She had morphine and phenergan shots.  Crying in triage

## 2012-09-15 ENCOUNTER — Encounter (HOSPITAL_COMMUNITY): Payer: Self-pay

## 2012-09-15 ENCOUNTER — Observation Stay (HOSPITAL_COMMUNITY)
Admission: EM | Admit: 2012-09-15 | Discharge: 2012-09-17 | Disposition: A | Discharge status: Home / Self Care | Payer: Medicare Other | Attending: Family Medicine | Admitting: Family Medicine

## 2012-09-15 ENCOUNTER — Telehealth: Payer: Self-pay | Admitting: Family Medicine

## 2012-09-15 ENCOUNTER — Emergency Department (HOSPITAL_COMMUNITY): Payer: Medicare Other

## 2012-09-15 ENCOUNTER — Encounter: Payer: Self-pay | Admitting: *Deleted

## 2012-09-15 DIAGNOSIS — E1149 Type 2 diabetes mellitus with other diabetic neurological complication: Secondary | ICD-10-CM | POA: Insufficient documentation

## 2012-09-15 DIAGNOSIS — K2990 Gastroduodenitis, unspecified, without bleeding: Secondary | ICD-10-CM | POA: Diagnosis not present

## 2012-09-15 DIAGNOSIS — R7301 Impaired fasting glucose: Secondary | ICD-10-CM | POA: Diagnosis not present

## 2012-09-15 DIAGNOSIS — IMO0002 Reserved for concepts with insufficient information to code with codable children: Secondary | ICD-10-CM

## 2012-09-15 DIAGNOSIS — K3184 Gastroparesis: Secondary | ICD-10-CM | POA: Insufficient documentation

## 2012-09-15 DIAGNOSIS — R1115 Cyclical vomiting syndrome unrelated to migraine: Secondary | ICD-10-CM

## 2012-09-15 DIAGNOSIS — E1322 Other specified diabetes mellitus with diabetic chronic kidney disease: Secondary | ICD-10-CM | POA: Diagnosis present

## 2012-09-15 DIAGNOSIS — I129 Hypertensive chronic kidney disease with stage 1 through stage 4 chronic kidney disease, or unspecified chronic kidney disease: Secondary | ICD-10-CM | POA: Insufficient documentation

## 2012-09-15 DIAGNOSIS — E1129 Type 2 diabetes mellitus with other diabetic kidney complication: Secondary | ICD-10-CM | POA: Insufficient documentation

## 2012-09-15 DIAGNOSIS — E118 Type 2 diabetes mellitus with unspecified complications: Secondary | ICD-10-CM

## 2012-09-15 DIAGNOSIS — R112 Nausea with vomiting, unspecified: Secondary | ICD-10-CM | POA: Diagnosis not present

## 2012-09-15 DIAGNOSIS — F1210 Cannabis abuse, uncomplicated: Secondary | ICD-10-CM | POA: Insufficient documentation

## 2012-09-15 DIAGNOSIS — R109 Unspecified abdominal pain: Secondary | ICD-10-CM | POA: Diagnosis not present

## 2012-09-15 DIAGNOSIS — I10 Essential (primary) hypertension: Secondary | ICD-10-CM | POA: Diagnosis not present

## 2012-09-15 DIAGNOSIS — E1165 Type 2 diabetes mellitus with hyperglycemia: Secondary | ICD-10-CM | POA: Insufficient documentation

## 2012-09-15 DIAGNOSIS — E876 Hypokalemia: Secondary | ICD-10-CM | POA: Diagnosis present

## 2012-09-15 DIAGNOSIS — N183 Chronic kidney disease, stage 3 unspecified: Secondary | ICD-10-CM | POA: Insufficient documentation

## 2012-09-15 DIAGNOSIS — E119 Type 2 diabetes mellitus without complications: Secondary | ICD-10-CM | POA: Diagnosis not present

## 2012-09-15 DIAGNOSIS — R1013 Epigastric pain: Secondary | ICD-10-CM | POA: Insufficient documentation

## 2012-09-15 LAB — URINE MICROSCOPIC-ADD ON

## 2012-09-15 LAB — CBC WITH DIFFERENTIAL/PLATELET
Eosinophils Relative: 2 % (ref 0–5)
HCT: 36.7 % (ref 36.0–46.0)
Hemoglobin: 12.7 g/dL (ref 12.0–15.0)
Lymphocytes Relative: 24 % (ref 12–46)
Lymphs Abs: 1.8 10*3/uL (ref 0.7–4.0)
MCH: 31.4 pg (ref 26.0–34.0)
MCV: 90.6 fL (ref 78.0–100.0)
Monocytes Absolute: 0.4 10*3/uL (ref 0.1–1.0)
Monocytes Relative: 5 % (ref 3–12)
RBC: 4.05 MIL/uL (ref 3.87–5.11)
WBC: 7.7 10*3/uL (ref 4.0–10.5)

## 2012-09-15 LAB — URINALYSIS, ROUTINE W REFLEX MICROSCOPIC
Ketones, ur: 40 mg/dL — AB
Nitrite: NEGATIVE
Specific Gravity, Urine: 1.02 (ref 1.005–1.030)
Urobilinogen, UA: 1 mg/dL (ref 0.0–1.0)

## 2012-09-15 LAB — COMPREHENSIVE METABOLIC PANEL
ALT: 18 U/L (ref 0–35)
BUN: 22 mg/dL (ref 6–23)
CO2: 24 mEq/L (ref 19–32)
Calcium: 10.2 mg/dL (ref 8.4–10.5)
GFR calc Af Amer: 42 mL/min — ABNORMAL LOW (ref >90)
GFR calc non Af Amer: 36 mL/min — ABNORMAL LOW (ref >90)
Glucose, Bld: 123 mg/dL — ABNORMAL HIGH (ref 70–99)
Sodium: 140 mEq/L (ref 135–145)

## 2012-09-15 MED ORDER — LORAZEPAM 2 MG/ML IJ SOLN
1.0000 mg | Freq: Once | INTRAMUSCULAR | Status: AC
  Administered 2012-09-15: 1 mg via INTRAVENOUS
  Filled 2012-09-15: qty 1

## 2012-09-15 MED ORDER — PROMETHAZINE HCL 25 MG/ML IJ SOLN
25.0000 mg | INTRAMUSCULAR | Status: AC
  Administered 2012-09-15: 25 mg via INTRAVENOUS
  Filled 2012-09-15: qty 1

## 2012-09-15 MED ORDER — HYDROMORPHONE HCL PF 1 MG/ML IJ SOLN
1.0000 mg | Freq: Once | INTRAMUSCULAR | Status: AC
  Administered 2012-09-15: 1 mg via INTRAVENOUS
  Filled 2012-09-15: qty 1

## 2012-09-15 MED ORDER — LABETALOL HCL 5 MG/ML IV SOLN
20.0000 mg | Freq: Once | INTRAVENOUS | Status: AC
  Administered 2012-09-15: 20 mg via INTRAVENOUS
  Filled 2012-09-15: qty 4

## 2012-09-15 MED ORDER — SODIUM CHLORIDE 0.9 % IV SOLN
INTRAVENOUS | Status: AC
  Administered 2012-09-15: 23:00:00 via INTRAVENOUS

## 2012-09-15 MED ORDER — ONDANSETRON 4 MG PO TBDP
8.0000 mg | ORAL_TABLET | Freq: Once | ORAL | Status: AC
  Administered 2012-09-15: 8 mg via ORAL

## 2012-09-15 MED ORDER — PROMETHAZINE HCL 25 MG/ML IJ SOLN
12.5000 mg | Freq: Once | INTRAMUSCULAR | Status: DC
  Filled 2012-09-15: qty 1

## 2012-09-15 MED ORDER — SODIUM CHLORIDE 0.9 % IV SOLN: INTRAVENOUS | Status: DC

## 2012-09-15 MED ORDER — ONDANSETRON 8 MG PO TBDP
ORAL_TABLET | ORAL | Status: AC
  Filled 2012-09-15: qty 1

## 2012-09-15 MED ORDER — SODIUM CHLORIDE 0.9 % IV BOLUS (SEPSIS)
1000.0000 mL | Freq: Once | INTRAVENOUS | Status: AC
  Administered 2012-09-15: 1000 mL via INTRAVENOUS

## 2012-09-15 NOTE — ED Notes (Signed)
[id number] Expected date:09/15/12<BR> Expected time: 6:27 PM<BR> Means of arrival:<BR> Comments:<BR> 70 F n/v from home

## 2012-09-15 NOTE — Telephone Encounter (Signed)
This encounter was created in error - please disregard.

## 2012-09-15 NOTE — ED Provider Notes (Signed)
Medical screening examination/treatment/procedure(s) were performed by non-physician practitioner and as supervising physician I was immediately available for consultation/collaboration.  Barbara Cower, MD 09/15/12 2326

## 2012-09-15 NOTE — Telephone Encounter (Signed)
Spoke with patient and advised that we have no available appointment left today and she should go ahead and call 911 and go back to ED . We were disconnected. Called back and voicemail came on, left message to go to ED.

## 2012-09-15 NOTE — ED Notes (Signed)
Per EMS, was at [facility] last night for abdominal pain-states it has not gotten better-N/V since yesterday

## 2012-09-15 NOTE — ED Provider Notes (Addendum)
History     CSN: [id number]  Arrival date & time 09/15/12  1836   First MD Initiated Contact with Patient 09/15/12 2006      Chief Complaint  Patient presents with  . Abdominal Pain  . N/V     (Consider location/radiation/quality/duration/timing/severity/associated sxs/prior treatment) HPI  Pt presents to the ER bye EMS with complaints of abdominal pain and vomiting. She has a hx of hypertension, diabetes and cyclical vomiting. She has had a cholecystectomy.  She describes the pain as epigastric. She was seen in the ED yesterday, labs were checked and her pain and nausea treated. She was given PO medications for home and failed outpatient treatment still in severe pain. Her blood pressure is elevated due to unable to keep her PO meds down. Pt in moderate distress due to pain and vomiting.   Past Medical History  Diagnosis Date  . PANCREATITIS 05/09/2008  . NEUROPATHY, DIABETIC 02/23/2007  . Cyclic vomiting syndrome   . Hypertension   . Complication of anesthesia     "problems waking up"  . Heart murmur   . Myocardial infarction     "they say I've had a silent one; I don't know"  . Pneumonia   . Type II diabetes mellitus   . Anemia   . Blood transfusion   . GERD (gastroesophageal reflux disease)   . Anxiety   . Depression   . Diabetic gastroparesis     /e-chart    Past Surgical History  Procedure Date  . Port-a-cath placement ~ 2008    right chest; "poor access; frequent hospitalizations"  . Laparotomy and lysis of adhesions   . Abdominal hysterectomy 02/2002  . Cholecystectomy 1980's    Family History  Problem Relation Age of Onset  . Diabetes type II Mother   . Diabetes Mother   . Hypertension Mother   . Diabetes type II Sister   . Diabetes Sister   . Hypertension Sister   . Hypertension Brother     History  Substance Use Topics  . Smoking status: Former Smoker -- 0.5 packs/day for 22 years    Types: Cigarettes    Quit date: 02/22/2012  . Smokeless  tobacco: Never Used  . Alcohol Use: No    OB History    Grav Para Term Preterm Abortions TAB SAB Ect Mult Living                  Review of Systems  Unable to get HPI due to patients active vomiting and abdominal pain    Allergies  Erythromycin; Acetaminophen; and Oxycodone-acetaminophen  Home Medications   Current Outpatient Rx  Name Route Sig Dispense Refill  . ASPIRIN EC 81 MG PO TBEC Oral Take 81 mg by mouth daily.    Marland Kitchen CARVEDILOL 25 MG PO TABS Oral Take 1 tablet (25 mg total) by mouth 2 (two) times daily with a meal. 30 tablet 11  . DICLOFENAC SODIUM 1 % TD GEL Topical Apply 1 application topically 4 (four) times daily. 100 g 3  . FLUTICASONE PROPIONATE 50 MCG/ACT NA SUSP Nasal Place 2 sprays into the nose daily. 16 g 6  . INSULIN ASPART 100 UNIT/ML [place] SOLN  4-8 units before meals 15 mL 10  . INSULIN GLARGINE 100 UNIT/ML [place] SOLN Subcutaneous Inject 30 Units into the skin daily.    Marland Kitchen LISINOPRIL 40 MG PO TABS Oral Take 1 tablet (40 mg total) by mouth daily. 30 tablet 11  . LORATADINE 10 MG PO CAPS  Oral Take 1 capsule (10 mg total) by mouth daily. 30 each 5  . LORAZEPAM 1 MG PO TABS Oral Take 1 tablet (1 mg total) by mouth 3 (three) times daily as needed for anxiety. 15 tablet 0  . NICOTINE 7 MG/24HR TD PT24  Apply 1 patch daily as needed. Remove old patch 30 patch 2  . OXYCODONE HCL 10 MG PO TABS Oral Take 10 mg by mouth every 4 (four) hours as needed. For pain.    Marland Kitchen PROMETHAZINE HCL 25 MG PO TABS Oral Take 25 mg by mouth every 6 (six) hours as needed. For nausea    . SENNA 8.6 MG PO TABS Oral Take 2 tablets (17.2 mg total) by mouth 2 (two) times daily. For constipation 60 each 3  . SUCRALFATE 1 G PO TABS Oral Take 1 tablet (1 g total) by mouth 4 (four) times daily. 120 tablet 3    RX SIG: TAKE 1 TABLET BY MOUTH FOUR TIMES A DAY(WI ...  . EPINEPHRINE 0.3 MG/0.3ML IJ DEVI Intramuscular Inject 0.3 mg into the muscle once. as needed for severe anaphylaxis.      BP 163/84   Pulse 104  Temp 99 F (37.2 C) (Oral)  Resp 16  SpO2 99%  Physical Exam  Nursing note and vitals reviewed. Constitutional: She appears well-developed and well-nourished. No distress.  HENT:  Head: Normocephalic and atraumatic.  Eyes: Pupils are equal, round, and reactive to light.  Neck: Normal range of motion. Neck supple.  Cardiovascular: Normal rate and regular rhythm.   Pulmonary/Chest: Effort normal.  Abdominal: Soft. She exhibits no distension and no mass. There is tenderness. There is guarding. There is no rebound.  Neurological: She is alert.  Skin: Skin is warm and dry.    ED Course  Procedures (including critical care time)  Labs Reviewed  COMPREHENSIVE METABOLIC PANEL - Abnormal; Notable for the following:    Potassium 3.3 (*)     Glucose, Bld 123 (*)     Creatinine, Ser 1.65 (*)     GFR calc non Af Amer 36 (*)     GFR calc Af Amer 42 (*)     All other components within normal limits  CBC WITH DIFFERENTIAL  LIPASE, BLOOD  URINALYSIS, ROUTINE W REFLEX MICROSCOPIC   Dg Abd 1 View  09/15/2012  *RADIOLOGY REPORT*  Clinical Data: Abdominal pain, nausea, vomiting, pancreatitis  ABDOMEN - 1 VIEW  Comparison: 01/17/2012  Findings: There is nonspecific nonobstructive bowel gas pattern. Moderate stool noted in the right colon.  Post cholecystectomy surgical clips are noted.  No free abdominal air.  IMPRESSION: Nonspecific nonobstructive bowel gas pattern.  Moderate stool noted in the right colon.  No free abdominal air.   Original Report Authenticated By: Lahoma Crocker, M.D.      1. Cyclical vomiting       MDM  Pt given 2 rounds of IV dilaudid 1mg  and phenergan 25mg  IV. She continues to heave and vomit. Due to patient being here twice in 24 hours for the problem and failing outpatient treatment I plan to admit her.   Admit, Telemetry, Team 8 [facility], inpatient  11:44pm: pt is family medicine patient. Family medicine Dr. Lindell Noe has accepted patient and pt will be transferred  by EMS to [facility].     Linus Mako, PA 09/15/12 [place], [place] 09/15/12 2345

## 2012-09-15 NOTE — Telephone Encounter (Signed)
Called back and spoke with patient . She voices understanding to go to ED.

## 2012-09-15 NOTE — Telephone Encounter (Signed)
Pt is still throwing up and can't stop - is about to call 911 again - wants to know what she should do.  Was here yesterday and also went to ED last night

## 2012-09-16 ENCOUNTER — Encounter (HOSPITAL_COMMUNITY): Payer: Self-pay

## 2012-09-16 DIAGNOSIS — R112 Nausea with vomiting, unspecified: Secondary | ICD-10-CM | POA: Diagnosis not present

## 2012-09-16 LAB — GLUCOSE, CAPILLARY
Glucose-Capillary: 100 mg/dL — ABNORMAL HIGH (ref 70–99)
Glucose-Capillary: 141 mg/dL — ABNORMAL HIGH (ref 70–99)
Glucose-Capillary: 136 mg/dL — ABNORMAL HIGH (ref 70–99)

## 2012-09-16 LAB — COMPREHENSIVE METABOLIC PANEL
ALT: 14 U/L (ref 0–35)
AST: 17 U/L (ref 0–37)
Albumin: 3.9 g/dL (ref 3.5–5.2)
Alkaline Phosphatase: 79 U/L (ref 39–117)
BUN: 24 mg/dL — ABNORMAL HIGH (ref 6–23)
CO2: 25 mEq/L (ref 19–32)
Calcium: 10 mg/dL (ref 8.4–10.5)
Chloride: 102 mEq/L (ref 96–112)
Creatinine, Ser: 1.53 mg/dL — ABNORMAL HIGH (ref 0.50–1.10)
GFR calc Af Amer: 46 mL/min — ABNORMAL LOW (ref >90)
GFR calc non Af Amer: 39 mL/min — ABNORMAL LOW (ref >90)
Glucose, Bld: 100 mg/dL — ABNORMAL HIGH (ref 70–99)
Potassium: 4 mEq/L (ref 3.5–5.1)
Sodium: 139 mEq/L (ref 135–145)
Total Bilirubin: 0.3 mg/dL (ref 0.3–1.2)
Total Protein: 7.4 g/dL (ref 6.0–8.3)

## 2012-09-16 LAB — RAPID URINE DRUG SCREEN, HOSP PERFORMED
Amphetamines: NOT DETECTED
Barbiturates: POSITIVE — AB
Benzodiazepines: NOT DETECTED
Cocaine: NOT DETECTED
Opiates: NOT DETECTED
Tetrahydrocannabinol: POSITIVE — AB

## 2012-09-16 LAB — CBC
HCT: 33.6 % — ABNORMAL LOW (ref 36.0–46.0)
Hemoglobin: 11.3 g/dL — ABNORMAL LOW (ref 12.0–15.0)
MCH: 31 pg (ref 26.0–34.0)
MCHC: 33.6 g/dL (ref 30.0–36.0)
MCV: 92.1 fL (ref 78.0–100.0)
Platelets: 203 10*3/uL (ref 150–400)
RBC: 3.65 MIL/uL — ABNORMAL LOW (ref 3.87–5.11)
RDW: 13.5 % (ref 11.5–15.5)
WBC: 7.4 10*3/uL (ref 4.0–10.5)

## 2012-09-16 MED ORDER — SENNA 8.6 MG PO TABS
2.0000 | ORAL_TABLET | Freq: Two times a day (BID) | ORAL | Status: DC | PRN
  Administered 2012-09-16: 17.2 mg via ORAL
  Filled 2012-09-16 (×2): qty 2

## 2012-09-16 MED ORDER — LISINOPRIL 40 MG PO TABS
40.0000 mg | ORAL_TABLET | Freq: Every day | ORAL | Status: DC
  Administered 2012-09-16 – 2012-09-17 (×2): 40 mg via ORAL
  Filled 2012-09-16 (×2): qty 1

## 2012-09-16 MED ORDER — INSULIN ASPART 100 UNIT/ML ~~LOC~~ SOLN
0.0000 [IU] | Freq: Three times a day (TID) | SUBCUTANEOUS | Status: DC
  Administered 2012-09-16: 2 [IU] via SUBCUTANEOUS
  Administered 2012-09-16 – 2012-09-17 (×2): 1 [IU] via SUBCUTANEOUS

## 2012-09-16 MED ORDER — SODIUM CHLORIDE 0.9 % IV SOLN: INTRAVENOUS | Status: DC

## 2012-09-16 MED ORDER — ZOLPIDEM TARTRATE 5 MG PO TABS: 5.0000 mg | ORAL_TABLET | Freq: Every evening | ORAL | Status: DC | PRN

## 2012-09-16 MED ORDER — POTASSIUM CHLORIDE IN NACL 20-0.9 MEQ/L-% IV SOLN
INTRAVENOUS | Status: DC
  Administered 2012-09-16 – 2012-09-17 (×3): via INTRAVENOUS
  Filled 2012-09-16 (×5): qty 1000

## 2012-09-16 MED ORDER — HEPARIN SODIUM (PORCINE) 5000 UNIT/ML IJ SOLN
5000.0000 [IU] | Freq: Three times a day (TID) | INTRAMUSCULAR | Status: DC
  Administered 2012-09-16 – 2012-09-17 (×4): 5000 [IU] via SUBCUTANEOUS
  Filled 2012-09-16 (×7): qty 1

## 2012-09-16 MED ORDER — CARVEDILOL 25 MG PO TABS
25.0000 mg | ORAL_TABLET | Freq: Two times a day (BID) | ORAL | Status: DC
  Administered 2012-09-16 – 2012-09-17 (×3): 25 mg via ORAL
  Filled 2012-09-16 (×6): qty 1

## 2012-09-16 MED ORDER — GI COCKTAIL ~~LOC~~
30.0000 mL | Freq: Once | ORAL | Status: AC
  Administered 2012-09-16: 30 mL via ORAL
  Filled 2012-09-16: qty 30

## 2012-09-16 MED ORDER — PROMETHAZINE HCL 25 MG/ML IJ SOLN
12.5000 mg | Freq: Four times a day (QID) | INTRAMUSCULAR | Status: DC | PRN
  Administered 2012-09-16 – 2012-09-17 (×2): 12.5 mg via INTRAVENOUS
  Filled 2012-09-16 (×2): qty 1

## 2012-09-16 MED ORDER — POTASSIUM CHLORIDE 10 MEQ/50ML IV SOLN
10.0000 meq | INTRAVENOUS | Status: AC
  Administered 2012-09-16 (×2): 10 meq via INTRAVENOUS
  Filled 2012-09-16 (×2): qty 50

## 2012-09-16 MED ORDER — LORAZEPAM 2 MG/ML IJ SOLN
1.0000 mg | Freq: Four times a day (QID) | INTRAMUSCULAR | Status: DC | PRN
  Administered 2012-09-16 – 2012-09-17 (×2): 1 mg via INTRAVENOUS
  Filled 2012-09-16 (×2): qty 1

## 2012-09-16 MED ORDER — NICOTINE 7 MG/24HR TD PT24
7.0000 mg | MEDICATED_PATCH | Freq: Every day | TRANSDERMAL | Status: DC
  Administered 2012-09-16 – 2012-09-17 (×2): 7 mg via TRANSDERMAL
  Filled 2012-09-16 (×3): qty 1

## 2012-09-16 MED ORDER — SUCRALFATE 1 G PO TABS
1.0000 g | ORAL_TABLET | Freq: Three times a day (TID) | ORAL | Status: DC
  Administered 2012-09-16 – 2012-09-17 (×6): 1 g via ORAL
  Filled 2012-09-16 (×9): qty 1

## 2012-09-16 MED ORDER — HYDROMORPHONE HCL PF 1 MG/ML IJ SOLN
1.0000 mg | INTRAMUSCULAR | Status: DC | PRN
  Administered 2012-09-16 – 2012-09-17 (×5): 1 mg via INTRAVENOUS
  Filled 2012-09-16 (×5): qty 1

## 2012-09-16 NOTE — ED Provider Notes (Signed)
Medical screening examination/treatment/procedure(s) were performed by non-physician practitioner and as supervising physician I was immediately available for consultation/collaboration.  Barbara Cower, MD 09/16/12 1526

## 2012-09-16 NOTE — H&P (Signed)
FMTS Attending Admit Note Patient seen and examined by me, discussed with resident team and I agree with assess/plan. Patient well known to service and to me from prior admissions.  She reports this morning that she has been able to tolerate clears, but has developed a unilateral (R) sided headache with photophobia.  She reports that Sumatriptan made her feel ill in the past, has been suggested that there may be a migraine component to her condition.  She cannot think of inciting event this time.  Plan to treat with antiemetics and pain regimen which she usually uses for resolution.  To induce BM, as her last BM was over 1 week ago.  Senekot usually helps her, already ordered. Slow advance of diet.  Dalbert Mayotte, MD

## 2012-09-16 NOTE — H&P (Signed)
[facility] Admission History and Physical Service Pager: [phone]  Patient name: [name] [name] Medical record number: [id number] Date of birth: 03/07/1966 Age: 46 y.o. Gender: female  Primary Care Provider: [facility] [address], [name], MD  Chief Complaint: vomiting  Assessment and Plan: [name] [name] is a 46 y.o. year old female with a history of cyclic vomiting syndrome, DM II, gastroparesis, CKD stage 3, and HTN, presenting with vomiting and abdominal pain.  1.  Vomiting/abdominal pain: patient with extensive history of vomiting episodes.  Current episode possibly related to marijuana use vs. Mild gastroparesis vs. Viral GI infection.  -admit to med-surg for control of nausea and abdominal pain  -IV phenergan for nausea  -ativan prn  -Dilaudid IV for pain  -GI cocktail  Senakot for constipation  2.  HTN: BP elevated to 149/100.  Will continue home medication coreg and lisinopril. Consider prn medications in IV if patient continues to have issues with vomiting.  3.  DM: patient with poor PO intake over the past several days.  Blood glucose 123 on CMET. Will hold home lantus until better PO intake.  SSI while in hospital.  4.  History of marijuana use: her use of marijuana has been discussed with her as a possible cause of vomiting by her PCP.  Possibly this has some contributing factor in recurrence of vomiting. UDS ordered.  5. Hypokalemia: 3.3 on CMET.  Will replete.  6.  FEN/GI: clear liquids, ice chips 7.  Prophylaxis: heparin SQ 8.  Disposition: pending improvement in symptoms and toleration of PO intake 9.  Code Status: full  History of Present Illness: [name] [name] is a 46 y.o. year old female with a history of cyclic vomiting syndrome, DM II, gastroparesis, CKD stage 3, and HTN, presenting with vomiting and abdominal pain.  Patient states that her symptoms started this past Monday.  She presented to clinic due to vomiting and abdominal pain and  was treated with ativan, phenergan, and morphine.  She continued to have issues at home and came back to clinic on Wednesday and received morphine and phenergan which did not help.  She then presented to the ED on Thursday and was treated with antiemetics. She continued to have nausea, vomiting x6, and abdominal pain in the epigastric region yesterday and went to the [facility] ED where she was given dilaudid, ativan, and phenergan.  Currently nausea is improved, but has had 4 episodes of vomiting since arriving at [facility].  In her past hospitalizations for this same issue scheduled ativan, phenergan, and dilaudid have helped control symptoms. Endorses feeling like her esophagus is raw.  Endorses constipation and states she sometimes doesn't have a BM for several weeks. States issue with nausea, vomiting, and constipation all started when she had a hysterectomy and they found that her small intestine was wrapped around her left ovary.  Denies blood in stool.  Notably patient does not have her gall bladder.  Lipase 2 days ago was 36.  Patient Active Problem List  Diagnosis  . DEPRESSION, MAJOR, RECURRENT  . ANXIETY  . TOBACCO DEPENDENCE  . MIGRAINE, UNSPEC., W/O INTRACTABLE MIGRAINE  . HYPERTENSION, BENIGN SYSTEMIC  . GERD  . BACK PAIN, CHRONIC  . INSOMNIA NOS  . Hot flashes  . Cyclic vomiting syndrome  . Uncontrolled secondary diabetes mellitus with stage 3 CKD (GFR 30-59)  . Marijuana smoker  . Cramps, extremity  . Shoulder pain, right  . Allergic rhinitis, seasonal  . High risk social situation  .  Hypokalemia   Past Medical History: Past Medical History  Diagnosis Date  . PANCREATITIS 05/09/2008  . NEUROPATHY, DIABETIC 02/23/2007  . Cyclic vomiting syndrome   . Hypertension   . Complication of anesthesia     "problems waking up"  . Heart murmur   . Myocardial infarction     "they say I've had a silent one; I don't know"  . Pneumonia   . Type II diabetes mellitus   . Anemia   . Blood  transfusion   . GERD (gastroesophageal reflux disease)   . Anxiety   . Depression   . Diabetic gastroparesis     /e-chart   Past Surgical History: Past Surgical History  Procedure Date  . Port-a-cath placement ~ 2008    right chest; "poor access; frequent hospitalizations"  . Laparotomy and lysis of adhesions   . Abdominal hysterectomy 02/2002  . Cholecystectomy 1980's   Social History: History  Substance Use Topics  . Smoking status: Former Smoker -- 0.5 packs/day for 22 years    Types: Cigarettes    Quit date: 02/22/2012  . Smokeless tobacco: Never Used  . Alcohol Use: No   For any additional social history documentation, please refer to relevant sections of EMR.  Family History: Family History  Problem Relation Age of Onset  . Diabetes type II Mother   . Diabetes Mother   . Hypertension Mother   . Diabetes type II Sister   . Diabetes Sister   . Hypertension Sister   . Hypertension Brother    Allergies: Allergies  Allergen Reactions  . Erythromycin Nausea And Vomiting  . Acetaminophen Nausea And Vomiting  . Oxycodone-Acetaminophen Nausea Only    Only intolerant to APAP component    Current Facility-Administered Medications on File Prior to Encounter  Medication Dose Route Frequency Provider Last Rate Last Dose  . ondansetron (ZOFRAN-ODT) disintegrating tablet 8 mg  8 mg Oral Once Nat Christen, MD   8 mg at 09/15/12 1926   Current Outpatient Prescriptions on File Prior to Encounter  Medication Sig Dispense Refill  . aspirin EC 81 MG tablet Take 81 mg by mouth daily.      . carvedilol (COREG) 25 MG tablet Take 1 tablet (25 mg total) by mouth 2 (two) times daily with a meal.  30 tablet  11  . diclofenac sodium (VOLTAREN) 1 % GEL Apply 1 application topically 4 (four) times daily.  100 g  3  . fluticasone (FLONASE) 50 MCG/ACT nasal spray Place 2 sprays into the nose daily.  16 g  6  . insulin aspart (NOVOLOG FLEXPEN) 100 UNIT/ML injection 4-8 units before meals  15  mL  10  . insulin glargine (LANTUS) 100 UNIT/ML injection Inject 30 Units into the skin daily.      Marland Kitchen lisinopril (PRINIVIL,ZESTRIL) 40 MG tablet Take 1 tablet (40 mg total) by mouth daily.  30 tablet  11  . Loratadine 10 MG CAPS Take 1 capsule (10 mg total) by mouth daily.  30 each  5  . LORazepam (ATIVAN) 1 MG tablet Take 1 tablet (1 mg total) by mouth 3 (three) times daily as needed for anxiety.  15 tablet  0  . nicotine (NICODERM CQ - DOSED IN MG/24 HR) 7 mg/24hr patch Apply 1 patch daily as needed. Remove old patch  30 patch  2  . promethazine (PHENERGAN) 25 MG tablet Take 25 mg by mouth every 6 (six) hours as needed. For nausea      . senna (SENOKOT)  8.6 MG TABS Take 2 tablets (17.2 mg total) by mouth 2 (two) times daily. For constipation  60 each  3  . sucralfate (CARAFATE) 1 G tablet Take 1 tablet (1 g total) by mouth 4 (four) times daily.  120 tablet  3  . EPINEPHrine (EPIPEN) 0.3 MG/0.3ML DEVI Inject 0.3 mg into the muscle once. as needed for severe anaphylaxis.       Review Of Systems: Per HPI with the following additions: none Otherwise 12 point review of systems was performed and was unremarkable.  Physical Exam: BP 149/100  Pulse 91  Temp 98.5 F (36.9 C) (Oral)  Resp 18  Wt 155 lb 13.8 oz (70.7 kg)  SpO2 100% Exam: General: NAD, resting comfortably in bed HEENT: [place], AT, dry mucus membranes Cardiovascular: rrr, no mumurs, rubs, or gallops Respiratory: CTAB, no wheezes or rhonchi Abdomen: soft, tender to palpation in epigastric region, ND, +BS Extremities: no edema Skin:  No lesions visualized Neuro:  Grossly normal  Labs and Imaging: CBC BMET   Lab 09/15/12 2108  WBC 7.7  HGB 12.7  HCT 36.7  PLT 207    Lab 09/15/12 2108  NA 140  K 3.3*  CL 100  CO2 24  BUN 22  CREATININE 1.65*  GLUCOSE 123*  CALCIUM 10.2     Results for orders placed during the hospital encounter of 09/15/12 (from the past 24 hour(s))  CBC WITH DIFFERENTIAL     Status: Normal    Collection Time   09/15/12  9:08 PM      Component Value Range   WBC 7.7  4.0 - 10.5 K/uL   RBC 4.05  3.87 - 5.11 MIL/uL   Hemoglobin 12.7  12.0 - 15.0 g/dL   HCT 36.7  36.0 - 46.0 %   MCV 90.6  78.0 - 100.0 fL   MCH 31.4  26.0 - 34.0 pg   MCHC 34.6  30.0 - 36.0 g/dL   RDW 13.5  11.5 - 15.5 %   Platelets 207  150 - 400 K/uL   Neutrophils Relative 69  43 - 77 %   Neutro Abs 5.3  1.7 - 7.7 K/uL   Lymphocytes Relative 24  12 - 46 %   Lymphs Abs 1.8  0.7 - 4.0 K/uL   Monocytes Relative 5  3 - 12 %   Monocytes Absolute 0.4  0.1 - 1.0 K/uL   Eosinophils Relative 2  0 - 5 %   Eosinophils Absolute 0.1  0.0 - 0.7 K/uL   Basophils Relative 0  0 - 1 %   Basophils Absolute 0.0  0.0 - 0.1 K/uL  COMPREHENSIVE METABOLIC PANEL     Status: Abnormal   Collection Time   09/15/12  9:08 PM      Component Value Range   Sodium 140  135 - 145 mEq/L   Potassium 3.3 (*) 3.5 - 5.1 mEq/L   Chloride 100  96 - 112 mEq/L   CO2 24  19 - 32 mEq/L   Glucose, Bld 123 (*) 70 - 99 mg/dL   BUN 22  6 - 23 mg/dL   Creatinine, Ser 1.65 (*) 0.50 - 1.10 mg/dL   Calcium 10.2  8.4 - 10.5 mg/dL   Total Protein 8.2  6.0 - 8.3 g/dL   Albumin 4.5  3.5 - 5.2 g/dL   AST 20  0 - 37 U/L   ALT 18  0 - 35 U/L   Alkaline Phosphatase 91  39 - 117 U/L  Total Bilirubin 0.5  0.3 - 1.2 mg/dL   GFR calc non Af Amer 36 (*) >90 mL/min   GFR calc Af Amer 42 (*) >90 mL/min  LIPASE, BLOOD     Status: Normal   Collection Time   09/15/12  9:08 PM      Component Value Range   Lipase 50  11 - 59 U/L  URINALYSIS, ROUTINE W REFLEX MICROSCOPIC     Status: Abnormal   Collection Time   09/15/12 10:26 PM      Component Value Range   Color, Urine YELLOW  YELLOW   APPearance CLEAR  CLEAR   Specific Gravity, Urine 1.020  1.005 - 1.030   pH 7.0  5.0 - 8.0   Glucose, UA NEGATIVE  NEGATIVE mg/dL   Hgb urine dipstick TRACE (*) NEGATIVE   Bilirubin Urine SMALL (*) NEGATIVE   Ketones, ur 40 (*) NEGATIVE mg/dL   Protein, ur 100 (*) NEGATIVE mg/dL    Urobilinogen, UA 1.0  0.0 - 1.0 mg/dL   Nitrite NEGATIVE  NEGATIVE   Leukocytes, UA SMALL (*) NEGATIVE  URINE MICROSCOPIC-ADD ON     Status: Normal   Collection Time   09/15/12 10:26 PM      Component Value Range   Squamous Epithelial / LPF RARE  RARE   WBC, UA 3-6  <3 WBC/hpf   RBC / HPF 0-2  <3 RBC/hpf  GLUCOSE, CAPILLARY     Status: Abnormal   Collection Time   09/16/12  2:38 AM      Component Value Range   Glucose-Capillary 100 (*) 70 - 99 mg/dL   Comment 1 Notify RN     CXR: Nonspecific nonobstructive bowel gas pattern. Moderate stool noted  in the right colon. No free abdominal air.   Tommi Rumps, MD 09/16/2012, 3:49 AM   PGY-3 Addendum  I have seen and examined patient and agree with exam and plan as outlined above by Dr. Solon Augusta.  This seems to be her typical pattern of pain and nausea/vomiting.  Often this has been triggered by marijuana use, will check UDS today. She typically responds well to a combination of dilaudid, ativan and phenergan.   Luetta Nutting, DO

## 2012-09-17 DIAGNOSIS — R112 Nausea with vomiting, unspecified: Secondary | ICD-10-CM | POA: Diagnosis not present

## 2012-09-17 LAB — GLUCOSE, CAPILLARY: Glucose-Capillary: 124 mg/dL — ABNORMAL HIGH (ref 70–99)

## 2012-09-17 MED ORDER — HEPARIN SOD (PORK) LOCK FLUSH 100 UNIT/ML IV SOLN: 500.0000 [IU] | INTRAVENOUS | Status: DC | PRN

## 2012-09-17 MED ORDER — LORAZEPAM 1 MG PO TABS: 1.0000 mg | ORAL_TABLET | Freq: Three times a day (TID) | ORAL | Status: DC | PRN

## 2012-09-17 MED ORDER — SODIUM CHLORIDE 0.9 % IJ SOLN: 10.0000 mL | INTRAMUSCULAR | Status: DC | PRN

## 2012-09-17 MED ORDER — SODIUM CHLORIDE 0.9 % IJ SOLN
10.0000 mL | Freq: Two times a day (BID) | INTRAMUSCULAR | Status: DC
  Administered 2012-09-17: 10 mL

## 2012-09-17 NOTE — Discharge Summary (Signed)
Physician Discharge Summary  Patient ID: [name] [name] MRN: [id number] DOB: 05-20-1966 Age: 46 y.o.  Admit date: 09/15/2012 Discharge date: 09/17/2012 Admitting Physician: Willeen Niece, MD  PCP: Verdie Drown, [name], MD  Consultants:none   Discharge Diagnosis: Cyclic vomiting Active Problems:  Cyclic vomiting syndrome  Uncontrolled secondary diabetes mellitus with stage 3 CKD (GFR 30-59)  Hypokalemia  Hospital Course [name] [name] is a 46 y.o. year old female with a history of cyclic vomiting syndrome, DM II, gastroparesis, CKD stage 3, and HTN, presenting with vomiting and abdominal pain.   1. Vomiting/abdominal pain: Patient presented with several days of nausea and vomiting.  UDS positive for marijuana and barbituates. Patient treated with IV phenergan and ativan for nausea and dilaudid for pain. GI cocktail x1. Senokot for constipation. Vomiting resolved on day of admission and patient was tolerating PO intake on day of discharge.   2. HTN: BP elevated to 170/77. Continued home medication coreg and lisinopril.   3. DM: patient with poor PO intake over the past several days. Blood glucose 124 on CMET. Will hold home lantus until better PO intake. SSI while in hospital.   4. History of marijuana use: her use of marijuana has been discussed with her as a possible cause of vomiting by her PCP. Possibly this has some contributing factor in recurrence of vomiting. UDS revealed positive for marijuana and barbituates.   5. Hypokalemia: hypokalemic at admission. Repleted with KCl and K 4.0 on CMET on day of discharge.   Problem List 1. Vomiting 2. Abdominal pain 3. HTN 4. DM 5. Marijuana positive UDS 6. Hypokalemia         Discharge PE   Filed Vitals:   09/17/12 0510  BP: 170/77  Pulse: 62  Temp: 98.3 F (36.8 C)  Resp: 18   General: NAD, resting comfortably in bed  Cardiovascular: rrr, no murmurs rubs or gallops  Respiratory: CTAB, no wheezes or crackles  Abdomen:  soft, minimal tenderness in epigastric region, ND, +BS, no masses  Extremities: no edema   Procedures/Imaging:  Dg Abd 1 View  09/15/2012   IMPRESSION: Nonspecific nonobstructive bowel gas pattern.  Moderate stool noted in the right colon.  No free abdominal air.     Labs  CBC  Lab 09/16/12 0440 09/15/12 2108 09/14/12 1838  WBC 7.4 7.7 6.8  HGB 11.3* 12.7 12.1  HCT 33.6* 36.7 34.6*  PLT 203 207 212   BMET  Lab 09/16/12 0440 09/15/12 2108 09/14/12 1838  NA 139 140 139  K 4.0 3.3* 3.4*  CL 102 100 101  CO2 25 24 25   BUN 24* 22 26*  CREATININE 1.53* 1.65* 1.57*  CALCIUM 10.0 10.2 10.5  PROT 7.4 8.2 7.9  BILITOT 0.3 0.5 0.3  ALKPHOS 79 91 85  ALT 14 18 21   AST 17 20 32  GLUCOSE 100* 123* 155*   Results for orders placed during the hospital encounter of 09/15/12 (from the past 72 hour(s))  CBC WITH DIFFERENTIAL     Status: Normal   Collection Time   09/15/12  9:08 PM      Component Value Range Comment   WBC 7.7  4.0 - 10.5 K/uL    RBC 4.05  3.87 - 5.11 MIL/uL    Hemoglobin 12.7  12.0 - 15.0 g/dL    HCT 36.7  36.0 - 46.0 %    MCV 90.6  78.0 - 100.0 fL    MCH 31.4  26.0 - 34.0 pg  MCHC 34.6  30.0 - 36.0 g/dL    RDW 13.5  11.5 - 15.5 %    Platelets 207  150 - 400 K/uL    Neutrophils Relative 69  43 - 77 %    Neutro Abs 5.3  1.7 - 7.7 K/uL    Lymphocytes Relative 24  12 - 46 %    Lymphs Abs 1.8  0.7 - 4.0 K/uL    Monocytes Relative 5  3 - 12 %    Monocytes Absolute 0.4  0.1 - 1.0 K/uL    Eosinophils Relative 2  0 - 5 %    Eosinophils Absolute 0.1  0.0 - 0.7 K/uL    Basophils Relative 0  0 - 1 %    Basophils Absolute 0.0  0.0 - 0.1 K/uL   COMPREHENSIVE METABOLIC PANEL     Status: Abnormal   Collection Time   09/15/12  9:08 PM      Component Value Range Comment   Sodium 140  135 - 145 mEq/L    Potassium 3.3 (*) 3.5 - 5.1 mEq/L    Chloride 100  96 - 112 mEq/L    CO2 24  19 - 32 mEq/L    Glucose, Bld 123 (*) 70 - 99 mg/dL    BUN 22  6 - 23 mg/dL    Creatinine,  Ser 1.65 (*) 0.50 - 1.10 mg/dL    Calcium 10.2  8.4 - 10.5 mg/dL    Total Protein 8.2  6.0 - 8.3 g/dL    Albumin 4.5  3.5 - 5.2 g/dL    AST 20  0 - 37 U/L    ALT 18  0 - 35 U/L    Alkaline Phosphatase 91  39 - 117 U/L    Total Bilirubin 0.5  0.3 - 1.2 mg/dL    GFR calc non Af Amer 36 (*) >90 mL/min    GFR calc Af Amer 42 (*) >90 mL/min   LIPASE, BLOOD     Status: Normal   Collection Time   09/15/12  9:08 PM      Component Value Range Comment   Lipase 50  11 - 59 U/L   URINALYSIS, ROUTINE W REFLEX MICROSCOPIC     Status: Abnormal   Collection Time   09/15/12 10:26 PM      Component Value Range Comment   Color, Urine YELLOW  YELLOW    APPearance CLEAR  CLEAR    Specific Gravity, Urine 1.020  1.005 - 1.030    pH 7.0  5.0 - 8.0    Glucose, UA NEGATIVE  NEGATIVE mg/dL    Hgb urine dipstick TRACE (*) NEGATIVE    Bilirubin Urine SMALL (*) NEGATIVE    Ketones, ur 40 (*) NEGATIVE mg/dL    Protein, ur 100 (*) NEGATIVE mg/dL    Urobilinogen, UA 1.0  0.0 - 1.0 mg/dL    Nitrite NEGATIVE  NEGATIVE    Leukocytes, UA SMALL (*) NEGATIVE   URINE MICROSCOPIC-ADD ON     Status: Normal   Collection Time   09/15/12 10:26 PM      Component Value Range Comment   Squamous Epithelial / LPF RARE  RARE    WBC, UA 3-6  <3 WBC/hpf    RBC / HPF 0-2  <3 RBC/hpf   GLUCOSE, CAPILLARY     Status: Abnormal   Collection Time   09/16/12  2:38 AM      Component Value Range Comment   Glucose-Capillary 100 (*) 70 -  99 mg/dL    Comment 1 Notify RN     CBC     Status: Abnormal   Collection Time   09/16/12  4:40 AM      Component Value Range Comment   WBC 7.4  4.0 - 10.5 K/uL    RBC 3.65 (*) 3.87 - 5.11 MIL/uL    Hemoglobin 11.3 (*) 12.0 - 15.0 g/dL    HCT 33.6 (*) 36.0 - 46.0 %    MCV 92.1  78.0 - 100.0 fL    MCH 31.0  26.0 - 34.0 pg    MCHC 33.6  30.0 - 36.0 g/dL    RDW 13.5  11.5 - 15.5 %    Platelets 203  150 - 400 K/uL   COMPREHENSIVE METABOLIC PANEL     Status: Abnormal   Collection Time   09/16/12   4:40 AM      Component Value Range Comment   Sodium 139  135 - 145 mEq/L    Potassium 4.0  3.5 - 5.1 mEq/L    Chloride 102  96 - 112 mEq/L    CO2 25  19 - 32 mEq/L    Glucose, Bld 100 (*) 70 - 99 mg/dL    BUN 24 (*) 6 - 23 mg/dL    Creatinine, Ser 1.53 (*) 0.50 - 1.10 mg/dL    Calcium 10.0  8.4 - 10.5 mg/dL    Total Protein 7.4  6.0 - 8.3 g/dL    Albumin 3.9  3.5 - 5.2 g/dL    AST 17  0 - 37 U/L    ALT 14  0 - 35 U/L    Alkaline Phosphatase 79  39 - 117 U/L    Total Bilirubin 0.3  0.3 - 1.2 mg/dL    GFR calc non Af Amer 39 (*) >90 mL/min    GFR calc Af Amer 46 (*) >90 mL/min   GLUCOSE, CAPILLARY     Status: Normal   Collection Time   09/16/12  6:13 AM      Component Value Range Comment   Glucose-Capillary 70  70 - 99 mg/dL    Comment 1 Notify RN     GLUCOSE, CAPILLARY     Status: Abnormal   Collection Time   09/16/12 11:55 AM      Component Value Range Comment   Glucose-Capillary 157 (*) 70 - 99 mg/dL    Comment 1 Documented in Chart      Comment 2 Notify RN     GLUCOSE, CAPILLARY     Status: Abnormal   Collection Time   09/16/12  3:53 PM      Component Value Range Comment   Glucose-Capillary 141 (*) 70 - 99 mg/dL    Comment 1 Documented in Chart      Comment 2 Notify RN     URINE RAPID DRUG SCREEN (HOSP PERFORMED)     Status: Abnormal   Collection Time   09/16/12  6:44 PM      Component Value Range Comment   Opiates NONE DETECTED  NONE DETECTED    Cocaine NONE DETECTED  NONE DETECTED    Benzodiazepines NONE DETECTED  NONE DETECTED    Amphetamines NONE DETECTED  NONE DETECTED    Tetrahydrocannabinol POSITIVE (*) NONE DETECTED    Barbiturates POSITIVE (*) NONE DETECTED   GLUCOSE, CAPILLARY     Status: Abnormal   Collection Time   09/16/12  9:07 PM      Component Value Range Comment  Glucose-Capillary 136 (*) 70 - 99 mg/dL    Comment 1 Notify RN     GLUCOSE, CAPILLARY     Status: Abnormal   Collection Time   09/17/12  6:44 AM      Component Value Range Comment    Glucose-Capillary 124 (*) 70 - 99 mg/dL    Comment 1 Notify RN      Patient condition at time of discharge/disposition: stable  Disposition-home   Follow up issues: 1. Drug abuse. Patient with long history of cyclic vomiting related to marijuana use.  Consider drug counseling.  Discharge follow up:  Follow-up Information    Please follow up. (Please call to schedule a followup appointment with your primary care doctor within one week.)          Discharge Instructions: Please refer to Patient Instructions section of EMR for full details.  Patient was counseled important signs and symptoms that should prompt return to medical care, changes in medications, dietary instructions, activity restrictions, and follow up appointments.  Significant instructions noted below:  Discharge Orders    Future Appointments: Provider: Department: Dept Phone: Center:   09/18/2012 9:30 AM Carolin Guernsey, MD [facility] [phone] [facility]     Future Orders Please Complete By Expires   Diet - low sodium heart healthy      Increase activity slowly      Call MD for:  temperature >100.4      Call MD for:  persistant nausea and vomiting      Call MD for:  severe uncontrolled pain      Call MD for:  redness, tenderness, or signs of infection (pain, swelling, redness, odor or green/yellow discharge around incision site)      Call MD for:  difficulty breathing, headache or visual disturbances      Call MD for:  hives      Call MD for:  persistant dizziness or light-headedness      Call MD for:  extreme fatigue        Discharge Medications   Medication List     As of 09/17/2012 10:14 PM    CHANGE how you take these medications         LORazepam 1 MG tablet   Commonly known as: ATIVAN   Take 1 tablet (1 mg total) by mouth 3 (three) times daily as needed for anxiety (or nausea).   What changed: reasons to take the med      CONTINUE taking these medications         aspirin EC 81 MG tablet        carvedilol 25 MG tablet   Commonly known as: COREG   Take 1 tablet (25 mg total) by mouth 2 (two) times daily with a meal.      diclofenac sodium 1 % Gel   Commonly known as: VOLTAREN   Apply 1 application topically 4 (four) times daily.      EPIPEN 0.3 mg/0.3 mL Devi   Generic drug: EPINEPHrine      fluticasone 50 MCG/ACT nasal spray   Commonly known as: FLONASE   Place 2 sprays into the nose daily.      insulin aspart 100 UNIT/ML injection   Commonly known as: novoLOG   4-8 units before meals      insulin glargine 100 UNIT/ML injection   Commonly known as: LANTUS      lisinopril 40 MG tablet   Commonly known as: PRINIVIL,ZESTRIL   Take  1 tablet (40 mg total) by mouth daily.      Loratadine 10 MG Caps   Take 1 capsule (10 mg total) by mouth daily.      nicotine 7 mg/24hr patch   Commonly known as: NICODERM CQ - dosed in mg/24 hr   Apply 1 patch daily as needed. Remove old patch      Oxycodone HCl 10 MG Tabs      promethazine 25 MG tablet   Commonly known as: PHENERGAN      senna 8.6 MG Tabs   Commonly known as: SENOKOT   Take 2 tablets (17.2 mg total) by mouth 2 (two) times daily. For constipation      sucralfate 1 G tablet   Commonly known as: CARAFATE   Take 1 tablet (1 g total) by mouth 4 (four) times daily.          Where to get your medications    These are the prescriptions that you need to pick up.   You may get these medications from any pharmacy.         LORazepam 1 MG tablet           Tommi Rumps, MD of [name] 09/17/2012 10:13 PM

## 2012-09-17 NOTE — Progress Notes (Signed)
FMTS Attending Note Patient seen and examined by me, discussed with resident Dr Caryl Bis and I agree with his assessment and plan for discharge.  [name] Mayotte, MD

## 2012-09-17 NOTE — Progress Notes (Signed)
Patient ID: [name] [name], female   DOB: Oct 06, 1966, 46 y.o.   MRN: [id number] Family Medicine Teaching Service Daily Progress Note Service Page: [phone]  Patient Assessment: 46 yo female with history of cyclical vomiting following use of marijuana presents with nausea and vomiting  Subjective: Patient states feeling well.  Has not vomited since Friday.  Complains of some mild nausea this morning.  Has been eating ok.  States she wants to go home  Objective: Temp:  [98 F (36.7 C)-98.7 F (37.1 C)] 98.3 F (36.8 C) (09/22 0510) Pulse Rate:  [61-69] 62  (09/22 0510) Resp:  [16-18] 18  (09/22 0510) BP: (122-173)/(53-77) 170/77 mmHg (09/22 0510) SpO2:  [100 %] 100 % (09/22 0510) Weight:  [154 lb 12.2 oz (70.2 kg)] 154 lb 12.2 oz (70.2 kg) (09/22 0510) Exam: General: NAD, resting comfortably in bed Cardiovascular: rrr, no murmurs rubs or gallops Respiratory: CTAB, no wheezes or crackles  Abdomen: soft, minimal tenderness in epigastric region, ND, +BS, no masses Extremities: no edema  I have reviewed the patient's medications, labs, imaging, and diagnostic testing.  Notable results are summarized below.  CBC BMET   Lab 09/16/12 0440 09/15/12 2108 09/14/12 1838  WBC 7.4 7.7 6.8  HGB 11.3* 12.7 12.1  HCT 33.6* 36.7 34.6*  PLT 203 207 212    Lab 09/16/12 0440 09/15/12 2108 09/14/12 1838  NA 139 140 139  K 4.0 3.3* 3.4*  CL 102 100 101  CO2 25 24 25   BUN 24* 22 26*  CREATININE 1.53* 1.65* 1.57*  GLUCOSE 100* 123* 155*  CALCIUM 10.0 10.2 10.5     Results for orders placed during the hospital encounter of 09/15/12 (from the past 24 hour(s))  GLUCOSE, CAPILLARY     Status: Abnormal   Collection Time   09/16/12 11:55 AM      Component Value Range   Glucose-Capillary 157 (*) 70 - 99 mg/dL   Comment 1 Documented in Chart     Comment 2 Notify RN    GLUCOSE, CAPILLARY     Status: Abnormal   Collection Time   09/16/12  3:53 PM      Component Value Range   Glucose-Capillary 141  (*) 70 - 99 mg/dL   Comment 1 Documented in Chart     Comment 2 Notify RN    URINE RAPID DRUG SCREEN (HOSP PERFORMED)     Status: Abnormal   Collection Time   09/16/12  6:44 PM      Component Value Range   Opiates NONE DETECTED  NONE DETECTED   Cocaine NONE DETECTED  NONE DETECTED   Benzodiazepines NONE DETECTED  NONE DETECTED   Amphetamines NONE DETECTED  NONE DETECTED   Tetrahydrocannabinol POSITIVE (*) NONE DETECTED   Barbiturates POSITIVE (*) NONE DETECTED  GLUCOSE, CAPILLARY     Status: Abnormal   Collection Time   09/16/12  9:07 PM      Component Value Range   Glucose-Capillary 136 (*) 70 - 99 mg/dL   Comment 1 Notify RN    GLUCOSE, CAPILLARY     Status: Abnormal   Collection Time   09/17/12  6:44 AM      Component Value Range   Glucose-Capillary 124 (*) 70 - 99 mg/dL   Comment 1 Notify RN      Imaging/Diagnostic Tests: none  Plan: [name] [name] is a 46 y.o. year old female with a history of cyclic vomiting syndrome, DM II, gastroparesis, CKD stage 3, and HTN, presenting with  vomiting and abdominal pain.   1. Vomiting/abdominal pain: patient with extensive history of vomiting episodes. Current episode possibly related to marijuana use vs. Mild gastroparesis vs. Viral GI infection. Symptoms much improved today.  Tolerating PO intake well. -IV phenergan for nausea  -ativan prn  -Dilaudid IV for pain  -GI cocktail x1 -Senakot for constipation   2. HTN: BP elevated to 170/77. Will continue home medication coreg and lisinopril.    3. DM: patient with poor PO intake over the past several days. Blood glucose 124 on CMET. Will hold home lantus until better PO intake. SSI while in hospital.   4. History of marijuana use: her use of marijuana has been discussed with her as a possible cause of vomiting by her PCP. Possibly this has some contributing factor in recurrence of vomiting. UDS ordered.   5. Hypokalemia: 4.0 on CMET.   6. FEN/GI: carb modified  7. Prophylaxis:  heparin SQ  8. Disposition: home today 9. Code Status: full   [name] Rumps, MD 09/17/2012, 9:24 AM

## 2012-09-18 ENCOUNTER — Ambulatory Visit (INDEPENDENT_AMBULATORY_CARE_PROVIDER_SITE_OTHER): Payer: Medicare Other | Admitting: Family Medicine

## 2012-09-18 ENCOUNTER — Encounter: Payer: Self-pay | Admitting: Family Medicine

## 2012-09-18 VITALS — BP 181/84 | HR 71 | Temp 98.4°F | Ht 61.0 in | Wt 162.0 lb

## 2012-09-18 DIAGNOSIS — I10 Essential (primary) hypertension: Secondary | ICD-10-CM | POA: Diagnosis not present

## 2012-09-18 DIAGNOSIS — Z0000 Encounter for general adult medical examination without abnormal findings: Secondary | ICD-10-CM | POA: Diagnosis not present

## 2012-09-18 DIAGNOSIS — M25519 Pain in unspecified shoulder: Secondary | ICD-10-CM

## 2012-09-18 DIAGNOSIS — E1329 Other specified diabetes mellitus with other diabetic kidney complication: Secondary | ICD-10-CM | POA: Diagnosis not present

## 2012-09-18 DIAGNOSIS — R1115 Cyclical vomiting syndrome unrelated to migraine: Secondary | ICD-10-CM

## 2012-09-18 DIAGNOSIS — Z609 Problem related to social environment, unspecified: Secondary | ICD-10-CM

## 2012-09-18 DIAGNOSIS — N058 Unspecified nephritic syndrome with other morphologic changes: Secondary | ICD-10-CM | POA: Diagnosis not present

## 2012-09-18 DIAGNOSIS — Z7289 Other problems related to lifestyle: Secondary | ICD-10-CM

## 2012-09-18 DIAGNOSIS — E1322 Other specified diabetes mellitus with diabetic chronic kidney disease: Secondary | ICD-10-CM

## 2012-09-18 DIAGNOSIS — M25511 Pain in right shoulder: Secondary | ICD-10-CM

## 2012-09-18 LAB — LIPID PANEL
Cholesterol: 159 mg/dL (ref 0–200)
LDL Cholesterol: 77 mg/dL (ref 0–99)
VLDL: 25 mg/dL (ref 0–40)

## 2012-09-18 LAB — GLUCOSE, CAPILLARY

## 2012-09-18 NOTE — Assessment & Plan Note (Signed)
Elevated today. She really does not want to start another anti-hypertensive.  -She will try low sodium, high potassium diet -She has already lost about 10-15 pounds (she was 177 at most). Encouraged continued weight loss to a healthy weight -Follow-up in 2 weeks. She will have a family member check manual blood pressures 2-3 times weekly for 2 weeks. If her SBP is consistently >150, we will add Norvasc. Norvasc/benazepril at [facility] is $15/month; may consider adding this combination medication since she is averse to taking more medication.

## 2012-09-18 NOTE — Progress Notes (Signed)
  Subjective:    Patient ID: [name] [name], female    DOB: Dec 28, 1965, 46 y.o.   MRN: [id number]  HPI # Hospital follow-up for exacerbation of cycling vomiting.  She had not had an episode in about 6 months. She is not sure what triggered current one. Her UDS was positive for marijuana and barbiturates. She had been smoking marijuana while she was in remission from cyclical vomiting syndrome. She denies taking medications for headaches or other pain medication besides oxycodone. I had recommended her stopping to take Ativan and Phenergan prophylacticly on 09/13. She did have mild exacerbation and came in on 09/17. She reported improvement in symptoms for a couple of days after re-starting medications, however, her symptoms returned resulting in another clinic visit on 09/19 and her being hospitalized 09/20.  She reports very mild nausea today.   # HTN She did not take her morning medications today. She usually takes at 10 am.  ROS: denies headache, chest pain  # Preventative Last LDL 104. She is not on a statin.   Review of Systems Per HPI  Allergies, medication, past medical history reviewed.  Significant for:  -Poorly controlled T2DM on insulin  -Cyclical vomiting syndrome  -Marijuana use -Depression, anxiety  -HTN  -High risk social situation--due to medical problems; disability; difficult getting food stamps    Objective:   Physical Exam GEN: NAD PSYCH: appears anxious CV: RRR, no m/r/g PULM: NI WOB; CTAB EXT: no edema NEURO: grossly intact without focal deficits    Assessment & Plan:

## 2012-09-18 NOTE — Patient Instructions (Addendum)
Measure your blood pressures 2-3 times a week. Follow-up in 2 weeks.   We will check your cholesterol today. If your lab results are normal, I will send you a letter with the results. If abnormal, someone at the clinic will get in touch with you.

## 2012-09-18 NOTE — Assessment & Plan Note (Signed)
FLP today. She drank coffee with sugar and cream. Her last LDL 104 1 year ago.

## 2012-09-18 NOTE — Assessment & Plan Note (Signed)
She has received food stamps.

## 2012-09-18 NOTE — Assessment & Plan Note (Signed)
Exacerbation with hospitalization 09/20-09/22. Resolved. Advised to continue Phenergan and Ativan (1 tablet of each daily) for prophylaxis, which seems to work for her.

## 2012-09-18 NOTE — Assessment & Plan Note (Signed)
Documentation only. She could not sleep and felt more pain the few days following steroid injection, however, she reports significant improvement in pain today.

## 2012-09-19 ENCOUNTER — Telehealth: Payer: Self-pay | Admitting: *Deleted

## 2012-09-19 NOTE — Telephone Encounter (Signed)
[facility] calling to verify patient's Lantus dosage.  Informed that patient's Lantus dosage is 30 units once a day.  Nolene Ebbs, RN

## 2012-09-21 ENCOUNTER — Encounter: Payer: Self-pay | Admitting: Family Medicine

## 2012-09-23 ENCOUNTER — Encounter (HOSPITAL_COMMUNITY): Payer: Self-pay | Admitting: Emergency Medicine

## 2012-09-23 ENCOUNTER — Inpatient Hospital Stay (HOSPITAL_COMMUNITY)
Admission: EM | Admit: 2012-09-23 | Discharge: 2012-09-25 | DRG: 392 | Disposition: A | Discharge status: Home / Self Care | Payer: Medicare Other | Attending: Family Medicine | Admitting: Family Medicine

## 2012-09-23 ENCOUNTER — Inpatient Hospital Stay (HOSPITAL_COMMUNITY): Payer: Medicare Other

## 2012-09-23 DIAGNOSIS — N952 Postmenopausal atrophic vaginitis: Secondary | ICD-10-CM | POA: Diagnosis present

## 2012-09-23 DIAGNOSIS — Z833 Family history of diabetes mellitus: Secondary | ICD-10-CM

## 2012-09-23 DIAGNOSIS — Z794 Long term (current) use of insulin: Secondary | ICD-10-CM

## 2012-09-23 DIAGNOSIS — N183 Chronic kidney disease, stage 3 unspecified: Secondary | ICD-10-CM | POA: Diagnosis present

## 2012-09-23 DIAGNOSIS — F329 Major depressive disorder, single episode, unspecified: Secondary | ICD-10-CM | POA: Diagnosis present

## 2012-09-23 DIAGNOSIS — E1142 Type 2 diabetes mellitus with diabetic polyneuropathy: Secondary | ICD-10-CM | POA: Diagnosis present

## 2012-09-23 DIAGNOSIS — K5900 Constipation, unspecified: Secondary | ICD-10-CM | POA: Diagnosis present

## 2012-09-23 DIAGNOSIS — E1322 Other specified diabetes mellitus with diabetic chronic kidney disease: Secondary | ICD-10-CM | POA: Diagnosis present

## 2012-09-23 DIAGNOSIS — Z8701 Personal history of pneumonia (recurrent): Secondary | ICD-10-CM | POA: Diagnosis not present

## 2012-09-23 DIAGNOSIS — R1115 Cyclical vomiting syndrome unrelated to migraine: Principal | ICD-10-CM | POA: Diagnosis present

## 2012-09-23 DIAGNOSIS — R011 Cardiac murmur, unspecified: Secondary | ICD-10-CM | POA: Diagnosis present

## 2012-09-23 DIAGNOSIS — E1165 Type 2 diabetes mellitus with hyperglycemia: Secondary | ICD-10-CM | POA: Diagnosis present

## 2012-09-23 DIAGNOSIS — N760 Acute vaginitis: Secondary | ICD-10-CM | POA: Diagnosis present

## 2012-09-23 DIAGNOSIS — K219 Gastro-esophageal reflux disease without esophagitis: Secondary | ICD-10-CM | POA: Diagnosis present

## 2012-09-23 DIAGNOSIS — A5901 Trichomonal vulvovaginitis: Secondary | ICD-10-CM | POA: Diagnosis present

## 2012-09-23 DIAGNOSIS — I129 Hypertensive chronic kidney disease with stage 1 through stage 4 chronic kidney disease, or unspecified chronic kidney disease: Secondary | ICD-10-CM | POA: Diagnosis present

## 2012-09-23 DIAGNOSIS — K2970 Gastritis, unspecified, without bleeding: Secondary | ICD-10-CM | POA: Diagnosis present

## 2012-09-23 DIAGNOSIS — R112 Nausea with vomiting, unspecified: Secondary | ICD-10-CM | POA: Diagnosis not present

## 2012-09-23 DIAGNOSIS — Z8249 Family history of ischemic heart disease and other diseases of the circulatory system: Secondary | ICD-10-CM

## 2012-09-23 DIAGNOSIS — R109 Unspecified abdominal pain: Secondary | ICD-10-CM | POA: Diagnosis not present

## 2012-09-23 DIAGNOSIS — E1149 Type 2 diabetes mellitus with other diabetic neurological complication: Secondary | ICD-10-CM | POA: Diagnosis present

## 2012-09-23 DIAGNOSIS — IMO0002 Reserved for concepts with insufficient information to code with codable children: Secondary | ICD-10-CM | POA: Diagnosis present

## 2012-09-23 DIAGNOSIS — Z7982 Long term (current) use of aspirin: Secondary | ICD-10-CM

## 2012-09-23 DIAGNOSIS — Z79899 Other long term (current) drug therapy: Secondary | ICD-10-CM | POA: Diagnosis not present

## 2012-09-23 DIAGNOSIS — E118 Type 2 diabetes mellitus with unspecified complications: Secondary | ICD-10-CM

## 2012-09-23 DIAGNOSIS — E119 Type 2 diabetes mellitus without complications: Secondary | ICD-10-CM | POA: Diagnosis not present

## 2012-09-23 DIAGNOSIS — F411 Generalized anxiety disorder: Secondary | ICD-10-CM | POA: Diagnosis present

## 2012-09-23 DIAGNOSIS — F3289 Other specified depressive episodes: Secondary | ICD-10-CM | POA: Diagnosis present

## 2012-09-23 DIAGNOSIS — E1129 Type 2 diabetes mellitus with other diabetic kidney complication: Secondary | ICD-10-CM | POA: Diagnosis present

## 2012-09-23 DIAGNOSIS — K3184 Gastroparesis: Secondary | ICD-10-CM | POA: Diagnosis present

## 2012-09-23 DIAGNOSIS — R1110 Vomiting, unspecified: Secondary | ICD-10-CM

## 2012-09-23 DIAGNOSIS — B9689 Other specified bacterial agents as the cause of diseases classified elsewhere: Secondary | ICD-10-CM | POA: Diagnosis present

## 2012-09-23 DIAGNOSIS — K2990 Gastroduodenitis, unspecified, without bleeding: Secondary | ICD-10-CM | POA: Diagnosis present

## 2012-09-23 DIAGNOSIS — A499 Bacterial infection, unspecified: Secondary | ICD-10-CM | POA: Diagnosis present

## 2012-09-23 DIAGNOSIS — I10 Essential (primary) hypertension: Secondary | ICD-10-CM | POA: Diagnosis present

## 2012-09-23 DIAGNOSIS — R232 Flushing: Secondary | ICD-10-CM | POA: Diagnosis present

## 2012-09-23 HISTORY — DX: Postmenopausal atrophic vaginitis: N95.2

## 2012-09-23 LAB — COMPREHENSIVE METABOLIC PANEL
ALT: 11 U/L (ref 0–35)
Alkaline Phosphatase: 77 U/L (ref 39–117)
BUN: 24 mg/dL — ABNORMAL HIGH (ref 6–23)
CO2: 23 mEq/L (ref 19–32)
GFR calc Af Amer: 43 mL/min — ABNORMAL LOW (ref >90)
GFR calc non Af Amer: 37 mL/min — ABNORMAL LOW (ref >90)
Glucose, Bld: 218 mg/dL — ABNORMAL HIGH (ref 70–99)
Potassium: 4.1 mEq/L (ref 3.5–5.1)
Sodium: 137 mEq/L (ref 135–145)
Total Bilirubin: 0.3 mg/dL (ref 0.3–1.2)

## 2012-09-23 LAB — URINALYSIS, ROUTINE W REFLEX MICROSCOPIC
Bilirubin Urine: NEGATIVE
Glucose, UA: >1000 mg/dL — AB
Ketones, ur: 15 mg/dL — AB
Protein, ur: 100 mg/dL — AB

## 2012-09-23 LAB — RAPID URINE DRUG SCREEN, HOSP PERFORMED
Amphetamines: NOT DETECTED
Benzodiazepines: NOT DETECTED
Cocaine: NOT DETECTED
Opiates: NOT DETECTED

## 2012-09-23 LAB — GASTRIC OCCULT BLOOD (1-CARD TO LAB): Occult Blood, Gastric: POSITIVE — AB

## 2012-09-23 LAB — CBC
HCT: 34.3 % — ABNORMAL LOW (ref 36.0–46.0)
Hemoglobin: 12.3 g/dL (ref 12.0–15.0)
RBC: 3.81 MIL/uL — ABNORMAL LOW (ref 3.87–5.11)

## 2012-09-23 LAB — URINE MICROSCOPIC-ADD ON

## 2012-09-23 LAB — GLUCOSE, CAPILLARY: Glucose-Capillary: 291 mg/dL — ABNORMAL HIGH (ref 70–99)

## 2012-09-23 MED ORDER — ENOXAPARIN SODIUM 30 MG/0.3ML ~~LOC~~ SOLN
30.0000 mg | SUBCUTANEOUS | Status: DC
  Administered 2012-09-23: 30 mg via SUBCUTANEOUS
  Filled 2012-09-23 (×2): qty 0.3

## 2012-09-23 MED ORDER — HYDROMORPHONE HCL PF 1 MG/ML IJ SOLN
1.0000 mg | Freq: Once | INTRAMUSCULAR | Status: DC
  Filled 2012-09-23: qty 1

## 2012-09-23 MED ORDER — SODIUM CHLORIDE 0.9 % IV SOLN
INTRAVENOUS | Status: DC
  Administered 2012-09-23 – 2012-09-25 (×6): via INTRAVENOUS

## 2012-09-23 MED ORDER — INSULIN ASPART 100 UNIT/ML ~~LOC~~ SOLN
0.0000 [IU] | SUBCUTANEOUS | Status: DC
  Administered 2012-09-23: 5 [IU] via SUBCUTANEOUS
  Administered 2012-09-24 (×2): 2 [IU] via SUBCUTANEOUS
  Administered 2012-09-24: 1 [IU] via SUBCUTANEOUS

## 2012-09-23 MED ORDER — HYDRALAZINE HCL 20 MG/ML IJ SOLN
5.0000 mg | Freq: Three times a day (TID) | INTRAMUSCULAR | Status: DC | PRN
  Administered 2012-09-23: 5 mg via INTRAVENOUS
  Filled 2012-09-23 (×2): qty 0.25

## 2012-09-23 MED ORDER — LORAZEPAM BOLUS VIA INFUSION: 1.0000 mg | Freq: Three times a day (TID) | INTRAVENOUS | Status: DC

## 2012-09-23 MED ORDER — LORAZEPAM 2 MG/ML IJ SOLN
1.0000 mg | Freq: Three times a day (TID) | INTRAMUSCULAR | Status: DC
  Administered 2012-09-23: 1 mg via INTRAVENOUS
  Filled 2012-09-23: qty 1

## 2012-09-23 MED ORDER — METOCLOPRAMIDE HCL 5 MG/ML IJ SOLN
10.0000 mg | Freq: Three times a day (TID) | INTRAMUSCULAR | Status: DC
  Administered 2012-09-23 – 2012-09-25 (×6): 10 mg via INTRAVENOUS
  Filled 2012-09-23 (×8): qty 2

## 2012-09-23 MED ORDER — HYDROMORPHONE HCL PF 1 MG/ML IJ SOLN
1.0000 mg | Freq: Once | INTRAMUSCULAR | Status: AC
  Administered 2012-09-23: 1 mg via INTRAMUSCULAR

## 2012-09-23 MED ORDER — INSULIN GLARGINE 100 UNIT/ML ~~LOC~~ SOLN
20.0000 [IU] | Freq: Every day | SUBCUTANEOUS | Status: DC
  Administered 2012-09-23 – 2012-09-24 (×2): 20 [IU] via SUBCUTANEOUS

## 2012-09-23 MED ORDER — HYDROMORPHONE HCL PF 1 MG/ML IJ SOLN
1.0000 mg | INTRAMUSCULAR | Status: DC | PRN
  Administered 2012-09-23: 1 mg via INTRAVENOUS
  Filled 2012-09-23 (×2): qty 1

## 2012-09-23 MED ORDER — ONDANSETRON HCL 4 MG/2ML IJ SOLN: 4.0000 mg | Freq: Once | INTRAMUSCULAR | Status: DC

## 2012-09-23 MED ORDER — SODIUM CHLORIDE 0.9 % IV BOLUS (SEPSIS)
1000.0000 mL | Freq: Once | INTRAVENOUS | Status: AC
  Administered 2012-09-23: 1000 mL via INTRAVENOUS

## 2012-09-23 MED ORDER — HYDROMORPHONE HCL PF 1 MG/ML IJ SOLN
1.0000 mg | Freq: Once | INTRAMUSCULAR | Status: AC
  Administered 2012-09-23: 1 mg via INTRAVENOUS

## 2012-09-23 MED ORDER — METOPROLOL TARTRATE 1 MG/ML IV SOLN
10.0000 mg | Freq: Three times a day (TID) | INTRAVENOUS | Status: AC
  Administered 2012-09-23: 5 mg via INTRAVENOUS
  Filled 2012-09-23: qty 10

## 2012-09-23 MED ORDER — PROMETHAZINE HCL 25 MG/ML IJ SOLN
25.0000 mg | Freq: Four times a day (QID) | INTRAMUSCULAR | Status: DC | PRN
  Administered 2012-09-23 – 2012-09-25 (×4): 25 mg via INTRAVENOUS
  Filled 2012-09-23 (×3): qty 1

## 2012-09-23 MED ORDER — IOHEXOL 300 MG/ML  SOLN: 20.0000 mL | INTRAMUSCULAR | Status: AC

## 2012-09-23 MED ORDER — ONDANSETRON 4 MG PO TBDP
4.0000 mg | ORAL_TABLET | Freq: Once | ORAL | Status: AC
  Administered 2012-09-23: 4 mg via ORAL
  Filled 2012-09-23: qty 1

## 2012-09-23 MED ORDER — LORAZEPAM 2 MG/ML IJ SOLN
1.0000 mg | Freq: Four times a day (QID) | INTRAMUSCULAR | Status: DC
  Administered 2012-09-24 (×3): 1 mg via INTRAVENOUS
  Filled 2012-09-23 (×2): qty 1

## 2012-09-23 MED ORDER — HYDROMORPHONE HCL PF 1 MG/ML IJ SOLN
0.5000 mg | INTRAMUSCULAR | Status: DC | PRN
  Administered 2012-09-24 – 2012-09-25 (×4): 0.5 mg via INTRAVENOUS
  Filled 2012-09-23 (×5): qty 1

## 2012-09-23 MED ORDER — GI COCKTAIL ~~LOC~~
30.0000 mL | Freq: Every day | ORAL | Status: DC | PRN
  Filled 2012-09-23: qty 30

## 2012-09-23 MED ORDER — HYDROMORPHONE HCL PF 1 MG/ML IJ SOLN
1.0000 mg | Freq: Once | INTRAMUSCULAR | Status: AC
  Administered 2012-09-23: 1 mg via INTRAVENOUS
  Filled 2012-09-23: qty 1

## 2012-09-23 MED ORDER — PANTOPRAZOLE SODIUM 40 MG IV SOLR
40.0000 mg | INTRAVENOUS | Status: DC
  Administered 2012-09-23: 40 mg via INTRAVENOUS
  Filled 2012-09-23 (×2): qty 40

## 2012-09-23 MED ORDER — PROMETHAZINE HCL 25 MG/ML IJ SOLN
25.0000 mg | Freq: Once | INTRAMUSCULAR | Status: AC
  Administered 2012-09-23: 25 mg via INTRAVENOUS
  Filled 2012-09-23 (×2): qty 1

## 2012-09-23 NOTE — ED Notes (Signed)
Pt given oral contrast to drink for CT scan. Stated, "there is no way I can drink that. I'll throw it right back up".

## 2012-09-23 NOTE — Progress Notes (Signed)
TARAJA SHANOR [id number] Admitted to [id number]: 09/23/2012 07:30PM Attending Provider: Blane Ohara McDiarmid, MD    [name] [name] is a 46 y.o. female patient admitted from ED awake, alert  & orientated  X 3,  Full Code, VSS - Blood pressure 192/102, pulse 122, temperature 97.6 F (36.4 C), temperature source Axillary, resp. Rate 22 , height 5\' 1"  (1.549 m), weight 72.7 kg (160 lb 4.4 oz), SpO2 97.00% RA, pt c/o N/V & severe abdominal pain, Dr. Adrian Blackwater notified and orders given for Dilaudid 1mg  IV x1 . Tele # [id number] placed and pt is currently running:sinus tachycardia.   IV site WDL:  Rt chest porta cath with a transparent dsg that's clean dry and intact.  Allergies:   Allergies  Allergen Reactions  . Erythromycin Nausea And Vomiting  . Acetaminophen Nausea And Vomiting  . Oxycodone-Acetaminophen Nausea Only    Only intolerant to APAP component      Past Medical History  Diagnosis Date  . PANCREATITIS 05/09/2008  . NEUROPATHY, DIABETIC 02/23/2007  . Cyclic vomiting syndrome   . Hypertension   . Complication of anesthesia     "problems waking up"  . Heart murmur   . Myocardial infarction 07/2007    "they say I've had a silent one; I don't know"  . Pneumonia   . Type II diabetes mellitus   . Anemia   . Blood transfusion   . GERD (gastroesophageal reflux disease)   . Anxiety   . Depression   . Diabetic gastroparesis     /e-chart    History:  obtained from the patient and previous admission  Pt orientation to unit, room and routine. Information packet given to patient/family, unable to watch safety video due to severe abd pain.  Admission INP armband ID verified with patient and in place. SR up x 2, fall risk assessment complete with Patient verbalizing understanding of risks associated with falls. Pt verbalizes an understanding of how to use the call bell, pt to call and wait for help before getting out of bed and bed alarm placed on.  Skin, clean-dry- intact without evidence of bruising,  or skin tears.   No evidence of skin break down noted on exam.   Will cont to monitor and assist as needed.  Darreld Mclean [facility], RN 09/23/2012 10:28 PM

## 2012-09-23 NOTE — ED Notes (Signed)
Admitting MD at bedside.

## 2012-09-23 NOTE — ED Notes (Signed)
Walked pt to the rest room and back to the bed. Pt wanted more pain meds rn [name] [name] was made aware. Pt started vomiting again once in room again.12:25pm JG

## 2012-09-23 NOTE — Progress Notes (Signed)
Blood pressure 136/78, pulse 116, Dr. Adrian Blackwater called to notify of patients current BP and pulse, orders given okay to only give lopressor 5mg  IV, 5mg  of IV Lopressor given and  5mg  of IV lopressor wasted in sink. Pt given scheduled IV ativan and it appears to be helping her rest comfortably in bed. Dr. Marquette Old made aware and IV ativan orders have been modified in [facility] by MD. Will continue to monitor and assist as needed. Darreld Mclean [facility]

## 2012-09-23 NOTE — ED Notes (Signed)
IV team paged.  

## 2012-09-23 NOTE — ED Notes (Signed)
Attempted to transport pt to ct scan. Pt states she needs more pain meds and nausea med. [facility] RN informed.

## 2012-09-23 NOTE — ED Provider Notes (Signed)
History     CSN: [id number]  Arrival date & time 09/23/12  0900   First MD Initiated Contact with Patient 09/23/12 [phone]      Chief Complaint  Patient presents with  . Abdominal Pain  . Nausea  . Emesis    (Consider location/radiation/quality/duration/timing/severity/associated sxs/prior treatment) HPI Comments: 46 year old female presents to the emergency department with severe abdominal pain associated with nausea and vomiting when she woke up this morning. She was admitted to the hospital on September 20 with cyclical vomiting most likely due from marijuana use. Denies any marijuana use since admission. She denies any change in diet or eating anything different than anyone else in her household. She states she has been diaphoretic. She has not had a bowel movement since Thursday. Her pain is located mostly in the epigastric region, nonradiating and rated 10 out of 10. She has not had any alleviating factors. Laying down flat makes the pain worse. Also admits to thick white vaginal discharge that she noticed a couple days ago. Denies vaginal bleeding or pain. No pelvic pain.  The history is provided by the patient. The history is limited by the condition of the patient.    Past Medical History  Diagnosis Date  . PANCREATITIS 05/09/2008  . NEUROPATHY, DIABETIC 02/23/2007  . Cyclic vomiting syndrome   . Hypertension   . Complication of anesthesia     "problems waking up"  . Heart murmur   . Myocardial infarction     "they say I've had a silent one; I don't know"  . Pneumonia   . Type II diabetes mellitus   . Anemia   . Blood transfusion   . GERD (gastroesophageal reflux disease)   . Anxiety   . Depression   . Diabetic gastroparesis     /e-chart    Past Surgical History  Procedure Date  . Port-a-cath placement ~ 2008    right chest; "poor access; frequent hospitalizations"  . Laparotomy and lysis of adhesions   . Abdominal hysterectomy 02/2002  . Cholecystectomy 1980's     Family History  Problem Relation Age of Onset  . Diabetes type II Mother   . Diabetes Mother   . Hypertension Mother   . Diabetes type II Sister   . Diabetes Sister   . Hypertension Sister   . Hypertension Brother     History  Substance Use Topics  . Smoking status: Former Smoker -- 0.5 packs/day for 22 years    Types: Cigarettes    Quit date: 02/22/2012  . Smokeless tobacco: Never Used  . Alcohol Use: No    OB History    Grav Para Term Preterm Abortions TAB SAB Ect Mult Living                  Review of Systems  Constitutional: Positive for diaphoresis and appetite change. Negative for fever.  HENT: Negative for neck pain.   Respiratory: Negative for shortness of breath.   Cardiovascular: Negative for chest pain.  Gastrointestinal: Positive for nausea, vomiting, abdominal pain and constipation (no bowel movement since Thursday). Negative for diarrhea.  Genitourinary: Positive for vaginal discharge. Negative for dysuria, hematuria, flank pain, vaginal bleeding, vaginal pain and pelvic pain.  Musculoskeletal: Negative for back pain.  Skin: Negative for color change and rash.  Neurological: Positive for weakness and light-headedness.  Psychiatric/Behavioral: Negative for confusion.    Allergies  Erythromycin; Acetaminophen; and Oxycodone-acetaminophen  Home Medications   Current Outpatient Rx  Name Route Sig Dispense Refill  .  ASPIRIN EC 81 MG PO TBEC Oral Take 81 mg by mouth daily.    Marland Kitchen CARVEDILOL 25 MG PO TABS Oral Take 1 tablet (25 mg total) by mouth 2 (two) times daily with a meal. 30 tablet 11  . DICLOFENAC SODIUM 1 % TD GEL Topical Apply 1 application topically 4 (four) times daily. 100 g 3  . EPINEPHRINE 0.3 MG/0.3ML IJ DEVI Intramuscular Inject 0.3 mg into the muscle once. as needed for severe anaphylaxis.    Marland Kitchen FLUTICASONE PROPIONATE 50 MCG/ACT NA SUSP Nasal Place 2 sprays into the nose daily. 16 g 6  . INSULIN ASPART 100 UNIT/ML [place] SOLN  4-8 units  before meals 15 mL 10  . INSULIN GLARGINE 100 UNIT/ML [place] SOLN Subcutaneous Inject 30 Units into the skin daily.    Marland Kitchen LISINOPRIL 40 MG PO TABS Oral Take 1 tablet (40 mg total) by mouth daily. 30 tablet 11  . LORATADINE 10 MG PO CAPS Oral Take 1 capsule (10 mg total) by mouth daily. 30 each 5  . LORAZEPAM 1 MG PO TABS Oral Take 1 tablet (1 mg total) by mouth 3 (three) times daily as needed for anxiety (or nausea). 15 tablet 0  . NICOTINE 7 MG/24HR TD PT24  Apply 1 patch daily as needed. Remove old patch 30 patch 2  . OXYCODONE HCL 10 MG PO TABS Oral Take 10 mg by mouth every 4 (four) hours as needed. For pain.    Marland Kitchen PROMETHAZINE HCL 25 MG PO TABS Oral Take 25 mg by mouth every 6 (six) hours as needed. For nausea    . SENNA 8.6 MG PO TABS Oral Take 2 tablets (17.2 mg total) by mouth 2 (two) times daily. For constipation 60 each 3  . SUCRALFATE 1 G PO TABS Oral Take 1 tablet (1 g total) by mouth 4 (four) times daily. 120 tablet 3    RX SIG: TAKE 1 TABLET BY MOUTH FOUR TIMES A DAY(WI ..Marland Kitchen    BP 183/93  Pulse 86  Temp 98.2 F (36.8 C) (Oral)  Resp 18  SpO2 99%  Physical Exam  Constitutional: She is oriented to person, place, and time. She appears well-developed and well-nourished. She appears distressed.  HENT:  Head: Normocephalic and atraumatic.  Mouth/Throat: Oropharynx is clear and moist.  Eyes: Conjunctivae normal are normal.  Neck: Normal range of motion. Neck supple.  Cardiovascular: Normal rate, regular rhythm, normal heart sounds and intact distal pulses.   Pulmonary/Chest: Effort normal and breath sounds normal. No respiratory distress. She has no wheezes.  Abdominal: Normal appearance and bowel sounds are normal. There is tenderness (diffuse, mostly epigastric). There is guarding. There is no CVA tenderness.  Genitourinary: There is no rash on the right labia. There is no rash on the left labia. Cervix exhibits discharge ( Thick, white). Cervix exhibits no motion tenderness and no  friability. Right adnexum displays no mass. Left adnexum displays no mass. No erythema, tenderness or bleeding around the vagina. Vaginal discharge (sick, white) found.  Musculoskeletal: Normal range of motion.  Neurological: She is alert and oriented to person, place, and time.  Skin: Skin is warm. No rash noted. She is diaphoretic.  Psychiatric: Her mood appears anxious. Her speech is rapid and/or pressured.    ED Course  Procedures (including critical care time)   Labs Reviewed  CBC  COMPREHENSIVE METABOLIC PANEL  LIPASE, BLOOD   Results for orders placed during the hospital encounter of 09/23/12  CBC      Component  Value Range   WBC 7.6  4.0 - 10.5 K/uL   RBC 3.81 (*) 3.87 - 5.11 MIL/uL   Hemoglobin 12.3  12.0 - 15.0 g/dL   HCT 34.3 (*) 36.0 - 46.0 %   MCV 90.0  78.0 - 100.0 fL   MCH 32.3  26.0 - 34.0 pg   MCHC 35.9  30.0 - 36.0 g/dL   RDW 13.5  11.5 - 15.5 %   Platelets 221  150 - 400 K/uL  COMPREHENSIVE METABOLIC PANEL      Component Value Range   Sodium 137  135 - 145 mEq/L   Potassium 4.1  3.5 - 5.1 mEq/L   Chloride 102  96 - 112 mEq/L   CO2 23  19 - 32 mEq/L   Glucose, Bld 218 (*) 70 - 99 mg/dL   BUN 24 (*) 6 - 23 mg/dL   Creatinine, Ser 1.61 (*) 0.50 - 1.10 mg/dL   Calcium 10.4  8.4 - 10.5 mg/dL   Total Protein 7.7  6.0 - 8.3 g/dL   Albumin 4.1  3.5 - 5.2 g/dL   AST 14  0 - 37 U/L   ALT 11  0 - 35 U/L   Alkaline Phosphatase 77  39 - 117 U/L   Total Bilirubin 0.3  0.3 - 1.2 mg/dL   GFR calc non Af Amer 37 (*) >90 mL/min   GFR calc Af Amer 43 (*) >90 mL/min  LIPASE, BLOOD      Component Value Range   Lipase 71 (*) 11 - 59 U/L  WET PREP, GENITAL      Component Value Range   Yeast Wet Prep HPF POC NONE SEEN  NONE SEEN   Trich, Wet Prep MODERATE (*) NONE SEEN   Clue Cells Wet Prep HPF POC MANY (*) NONE SEEN   WBC, Wet Prep HPF POC RARE (*) NONE SEEN    No results found.   No diagnosis found.    MDM  46 year old female with intractable nausea and  vomiting. She claims Zofran and Phenergan are not helping her nausea along with Dilaudid not helping her pain. Wet prep showing Trichomonas and BV. No CMT on exam, however this was difficult to assess due to patient moaning throughout the entire exam. Lipase mildly elevated, which is new from September 20. She's afebrile. Due to her intractable nausea and vomiting, she will not drink the CT contrast. She will be admitted to family practice.        Illene Labrador, PA-C 09/23/12 1529

## 2012-09-23 NOTE — H&P (Signed)
[name] [name] is an 46 y.o. female.   PCP: [name] [name], [place]. Chief Complaint: abdominal pain and nausea  HPI:  46 yo F with a history significant for DM2 with gastroparesis presents acute onset of nausea and epigastric abdominal pain this AM 5. The pain woke her from sleep. She reports multiple episode of small NB/NB emesis. She reports chests pain and indicates epigastric area. She also reports vaginal discharge and itching. She has normal bowel movements. Last episode this AM. Denies melena. She denies fever, SOB, sick contacts. She lives alone. Last smoked marijuana two weeks ago.   Of note, she was admitted on 9/20 for similar symptoms and discharged on 9/22 when symptoms resolved.   ED course: pelvic exam done, no CMT. Discharge noted. +trich and BV. ABD/pelvis CT scan with contrast ordered but not obtained due to patients inability to drink contrast.  She received 1 l NS bolus, dilaudid 1 mg IV x 2, zofran 4 mg ODT and phenergan 25 mg IV.   Past Medical History  Diagnosis Date  . PANCREATITIS 05/09/2008  . NEUROPATHY, DIABETIC 02/23/2007  . Cyclic vomiting syndrome   . Hypertension   . Complication of anesthesia     "problems waking up"  . Heart murmur   . Myocardial infarction 07/2007    "they say I've had a silent one; I don't know"  . Pneumonia   . Type II diabetes mellitus   . Anemia   . Blood transfusion   . GERD (gastroesophageal reflux disease)   . Anxiety   . Depression   . Diabetic gastroparesis     /e-chart   Past Surgical History  Procedure Date  . Port-a-cath placement ~ 2008    right chest; "poor access; frequent hospitalizations"  . Laparotomy and lysis of adhesions   . Abdominal hysterectomy 02/2002  . Cholecystectomy 1980's   Family History  Problem Relation Age of Onset  . Diabetes type II Mother   . Diabetes Mother   . Hypertension Mother   . Diabetes type II Sister   . Diabetes Sister   . Hypertension Sister   . Hypertension Brother     Social History:  reports that she quit smoking about 7 months ago. Her smoking use included Cigarettes. She has a 11 pack-year smoking history. She has never used smokeless tobacco. She reports that she uses illicit drugs (Marijuana). She reports that she does not drink alcohol.  Allergies:  Allergies  Allergen Reactions  . Erythromycin Nausea And Vomiting  . Acetaminophen Nausea And Vomiting  . Oxycodone-Acetaminophen Nausea Only    Only intolerant to APAP component     Medications Prior to Admission  Medication Sig Dispense Refill  . aspirin EC 81 MG tablet Take 81 mg by mouth daily.      . carvedilol (COREG) 25 MG tablet Take 1 tablet (25 mg total) by mouth 2 (two) times daily with a meal.  30 tablet  11  . diclofenac sodium (VOLTAREN) 1 % GEL Apply 1 application topically 4 (four) times daily.  100 g  3  . EPINEPHrine (EPIPEN) 0.3 MG/0.3ML DEVI Inject 0.3 mg into the muscle once. as needed for severe anaphylaxis.      . fluticasone (FLONASE) 50 MCG/ACT nasal spray Place 2 sprays into the nose daily.  16 g  6  . insulin aspart (NOVOLOG) 100 UNIT/ML injection Inject 4-8 Units into the skin 3 (three) times daily before meals. Sliding scale      . insulin  glargine (LANTUS) 100 UNIT/ML injection Inject 30 Units into the skin daily.      Marland Kitchen lisinopril (PRINIVIL,ZESTRIL) 40 MG tablet Take 1 tablet (40 mg total) by mouth daily.  30 tablet  11  . Loratadine 10 MG CAPS Take 1 capsule (10 mg total) by mouth daily.  30 each  5  . LORazepam (ATIVAN) 1 MG tablet Take 1 tablet (1 mg total) by mouth 3 (three) times daily as needed for anxiety (or nausea).  15 tablet  0  . nicotine (NICODERM CQ - DOSED IN MG/24 HR) 7 mg/24hr patch Apply 1 patch daily as needed. Remove old patch  30 patch  2  . Oxycodone HCl 10 MG TABS Take 10 mg by mouth every 4 (four) hours as needed. For pain.      . promethazine (PHENERGAN) 25 MG tablet Take 25 mg by mouth every 6 (six) hours as needed. For nausea      . senna  (SENOKOT) 8.6 MG TABS Take 2 tablets (17.2 mg total) by mouth 2 (two) times daily. For constipation  60 each  3  . sucralfate (CARAFATE) 1 G tablet Take 1 tablet (1 g total) by mouth 4 (four) times daily.  120 tablet  3  . LIDODERM 5 %       . metoCLOPramide (REGLAN) 10 MG tablet       . omeprazole (PRILOSEC) 20 MG capsule        Pertinent Labs: WBC 7.6 Hgb 12.3 BMP: 137/102/ 4.01/18/23/1.61 (baseline 1.3-1.7) Glucose 218 > 291  LFTs wnl Lipase 71 (up from 50 last admission)  Gastroccult positive  Wet prep: moderate trich and clue cells  UDS: + marijuana  UA: >1000 glucose, moderate Hgb, 15 ketones, 100 protein. Neg LE and nitrites. 3-6 RBCs.   Studies: none   ROS negative except as per HPI  Blood pressure 181/117, pulse 93, temperature 98.2 F (36.8 C), temperature source Oral, resp. rate 17, SpO2 100.00%. Physical Exam  General appearance: alert, cooperative and moderate distress, lying on side in bed before I entered the room. The sitting up, writhing and heaving.  Eyes: conjunctivae/corneas clear. PERRL, EOM's intact.  Throat: lips, mucosa, and tongue normal; teeth and gums normal Neck: no adenopathy, no carotid bruit, no JVD, supple, symmetrical, trachea midline and thyroid not enlarged, symmetric, no tenderness/mass/nodules Back: symmetric, no curvature. ROM normal. No CVA tenderness. Lungs: clear to auscultation bilaterally Heart: sinus tachycardia. No MRG.  Abdomen: NABS, absomen, soft, flat, TTP eigastric with voluntary guarding. no rebound Extremities: extremities normal, atraumatic, no cyanosis or edema Pulses: 2+ and symmetric Skin: Skin color, texture, turgor normal. No rashes or lesions Neurologic: Grossly normal  Assessment/Plan 46 yo F with DM2 and history of diabetic gastroparesis/cylic vomiting presents with epigastric pain and emesis.  # Epigastric pain and emesis: A: patient afebrile and hemodynamically stable so acute infectious process/vascular process  is unlikely. This is most likely recurrent cylical vomiting and gastritis.  Exam is not consistent with acute abdomen. Will cancel CT scan for now.  P: Admit to tele for cardiac monitoring, tachycardia NPO except for ice chips  EKG Additional 1 L NS bolus, then 125/hr  Pain/nausea control: with dilaudid, ativan, phenergan and Reglan. She has responded well to this regimen in the past.  PPI AM CBC and BMP  #HTN A: elevated BP. Unable to tolerate oral medications. P: pain control. IV BB schedule with IB hydralazine prn.   # DM2  With CKD A: elevated CBGs. Ketones and glucose in  urine. No acidosis or anion gap. Cr at baseline.  P: lantus 20 q HS (decreased from home dose of 30). Low does SSI  #vaginitis  A: trichomoniasis and BV.  P: Treat with flagyl when tolerating orals Check urein Gc/chlamydia   FEN/GI: lytes wnl. NPO except for ice chips. IVFs.   DVT PPx: lovenox.   Dispo: to home pending clinical improvement.   Gerrod Maule 09/23/2012, 8:52 PM [id number]

## 2012-09-23 NOTE — ED Provider Notes (Signed)
Medical screening examination/treatment/procedure(s) were conducted as a shared visit with non-physician practitioner(s) and myself.  I personally evaluated the patient during the encounter  Barbara Cower, MD 09/23/12 1620

## 2012-09-23 NOTE — Plan of Care (Signed)
Problem: Phase I Progression Outcomes Goal: Initial discharge plan identified Outcome: Completed/Met Date Met:  09/23/12 To return home with family when medically cleared

## 2012-09-23 NOTE — ED Notes (Addendum)
Resting, eyes closed. Upon awakening reports nausea continues & pain essentially has not changed. Pt requesting more pain med. ED PA informed & aware

## 2012-09-23 NOTE — ED Notes (Signed)
Pt c/o severe abdominal pain with n/v onset this morning. Pt denies change in diet. Pt reports thick white vaginal discharge and itching in vaginal area.

## 2012-09-24 LAB — BASIC METABOLIC PANEL
Calcium: 9.4 mg/dL (ref 8.4–10.5)
GFR calc non Af Amer: 40 mL/min — ABNORMAL LOW (ref >90)
Glucose, Bld: 53 mg/dL — ABNORMAL LOW (ref 70–99)
Sodium: 143 mEq/L (ref 135–145)

## 2012-09-24 LAB — CBC
MCH: 31.5 pg (ref 26.0–34.0)
Platelets: 201 10*3/uL (ref 150–400)
RBC: 3.59 MIL/uL — ABNORMAL LOW (ref 3.87–5.11)
WBC: 9.6 10*3/uL (ref 4.0–10.5)

## 2012-09-24 LAB — GLUCOSE, CAPILLARY
Glucose-Capillary: 120 mg/dL — ABNORMAL HIGH (ref 70–99)
Glucose-Capillary: 143 mg/dL — ABNORMAL HIGH (ref 70–99)
Glucose-Capillary: 160 mg/dL — ABNORMAL HIGH (ref 70–99)

## 2012-09-24 MED ORDER — ENOXAPARIN SODIUM 40 MG/0.4ML ~~LOC~~ SOLN
40.0000 mg | SUBCUTANEOUS | Status: DC
  Administered 2012-09-24: 40 mg via SUBCUTANEOUS
  Filled 2012-09-24 (×2): qty 0.4

## 2012-09-24 MED ORDER — PROMETHAZINE HCL 25 MG/ML IJ SOLN
25.0000 mg | Freq: Four times a day (QID) | INTRAMUSCULAR | Status: AC
  Administered 2012-09-24 – 2012-09-25 (×2): 25 mg via INTRAVENOUS
  Filled 2012-09-24 (×3): qty 1

## 2012-09-24 MED ORDER — SODIUM CHLORIDE 0.9 % IJ SOLN
10.0000 mL | INTRAMUSCULAR | Status: DC | PRN
  Administered 2012-09-25: 10 mL

## 2012-09-24 MED ORDER — POTASSIUM CHLORIDE 10 MEQ/100ML IV SOLN
10.0000 meq | INTRAVENOUS | Status: AC
  Administered 2012-09-24 (×3): 10 meq via INTRAVENOUS
  Filled 2012-09-24 (×3): qty 100

## 2012-09-24 MED ORDER — PANTOPRAZOLE SODIUM 40 MG IV SOLR
40.0000 mg | Freq: Two times a day (BID) | INTRAVENOUS | Status: DC
  Administered 2012-09-24 – 2012-09-25 (×2): 40 mg via INTRAVENOUS
  Filled 2012-09-24 (×3): qty 40

## 2012-09-24 MED ORDER — HYDROMORPHONE HCL PF 1 MG/ML IJ SOLN
0.5000 mg | INTRAMUSCULAR | Status: DC
  Administered 2012-09-24 – 2012-09-25 (×4): 0.5 mg via INTRAVENOUS
  Filled 2012-09-24 (×3): qty 1

## 2012-09-24 NOTE — Plan of Care (Signed)
Problem: Phase II Progression Outcomes Goal: Progress activity as tolerated unless otherwise ordered Outcome: Completed/Met Date Met:  09/24/12 OOB to [facility] with supevision

## 2012-09-24 NOTE — Progress Notes (Addendum)
Patients CBG was 70 at 0400. Dr. Esperanza Richters notified and has entered orders for start a CLD in [place]. Pt given a regular ginger ale and advised to take sips of the soda, pt currently tolerating well. Will continue to monitor and assist as needed. Darreld Mclean [facility]

## 2012-09-24 NOTE — H&P (Signed)
I have seen and examined this patient. I have discussed with Dr Adrian Blackwater.  I agree with their findings and plans as documented in their admission note.  Acute Issues 1. Cyclic Vomiting exacerbation - Scheduled opiate/phenergan/benzo for 24 hours then as needed  2. Trichomonas Vaginitis - systemic treatment once GI symptoms improved.

## 2012-09-24 NOTE — Progress Notes (Signed)
FMTS Daily Progress Note  Subjective: Doing better this morning.  Emesis x1 this morning.  Mild nausea, but medications improved it.  Abdominal pain rated as 8/10, but states slowly coming down.  Tolerated sips this morning, not ready to try solids or pills yet.  Upset by news of having trich.  I have reviewed the patient's medications.  Objective Temp:  [97.6 F (36.4 C)-98.2 F (36.8 C)] 98.2 F (36.8 C) (09/29 0628) Pulse Rate:  [76-122] 92  (09/29 0628) Resp:  [16-22] 18  (09/29 0628) BP: (136-229)/(78-117) 156/84 mmHg (09/29 0628) SpO2:  [93 %-100 %] 93 % (09/29 0628) Weight:  [160 lb 4.4 oz (72.7 kg)] 160 lb 4.4 oz (72.7 kg) (09/28 1958)   Intake/Output Summary (Last 24 hours) at 09/24/12 0908 Last data filed at 09/24/12 [id number]  Gross per 24 hour  Intake 2047.92 ml  Output    300 ml  Net 1747.92 ml    CBG (last 3)   Basename 09/24/12 0801 09/24/12 0358 09/24/12 0003  GLUCAP 108* 70 160*    General: alert, cooperative, NAD, did become tearful when discussing trich and implications HEENT: AT/[place], sclera white, MMM CV: RRR, no murmurs Pulm: CTAB, no wheezes or rales Abd: +BS, soft, moderate tenderness particularly in epigastric region, no rebound or guarding Ext: no edema Neuro: no obvious focal deficits  Labs and Imaging  Lab 09/24/12 0500 09/23/12 1030  WBC 9.6 7.6  HGB 11.3* 12.3  HCT 32.4* 34.3*  PLT 201 221     Lab 09/24/12 0500 09/23/12 1030  NA 143 137  K 3.4* 4.1  CL 108 102  CO2 25 23  BUN 23 24*  CREATININE 1.50* 1.61*  LABGLOM -- --  GLUCOSE 53* --  CALCIUM 9.4 10.4    Assessment and Plan 46 yo F with DM2 and history of diabetic gastroparesis/cylic vomiting presents with epigastric pain and emesis.   # Epigastric pain and emesis:  A: patient afebrile and hemodynamically stable so acute infectious process/vascular process is unlikely. This is most likely recurrent cylical vomiting and gastritis. Exam is not consistent with acute abdomen.     P:  -continue clear liquid diet, advance as tolerated  -Pain/nausea control: with dilaudid, ativan, phenergan and Reglan. She has responded well to this regimen in the past.  -PPI  -monitor electrolytes with BMP   #HTN  A: elevated BP. Unable to tolerate oral medications.  P: pain control. IV hydralazine prn. Restart meds when able to tolerate PO.  # DM2 With CKD  A: elevated CBGs. Ketones and glucose in urine. No acidosis or anion gap. Cr at baseline.  P: lantus 20 q HS (decreased from home dose of 30). Low does SSI   #vaginitis  A: trichomoniasis and BV.  P:  Treat with flagyl when tolerating orals  f/u urine Gc/chlamydia   FEN/GI: clear liquid diet, ADAT. IVFs. Replete K as needed  DVT PPx: lovenox.  Dispo: to home pending clinical improvement.   Jaquita Rector Pager: [phone] 09/24/2012, 9:08 AM

## 2012-09-24 NOTE — Progress Notes (Signed)
I discussed with  Dr Booth.  I agree with their plans documented in their progress note for today.  

## 2012-09-25 LAB — GC/CHLAMYDIA PROBE AMP, GENITAL: Chlamydia, DNA Probe: NEGATIVE

## 2012-09-25 LAB — BASIC METABOLIC PANEL
Calcium: 8.6 mg/dL (ref 8.4–10.5)
Chloride: 106 mEq/L (ref 96–112)
Creatinine, Ser: 1.38 mg/dL — ABNORMAL HIGH (ref 0.50–1.10)
GFR calc Af Amer: 52 mL/min — ABNORMAL LOW (ref >90)

## 2012-09-25 LAB — GLUCOSE, CAPILLARY
Glucose-Capillary: 96 mg/dL (ref 70–99)
Glucose-Capillary: 50 mg/dL — ABNORMAL LOW (ref 70–99)
Glucose-Capillary: 102 mg/dL — ABNORMAL HIGH (ref 70–99)

## 2012-09-25 MED ORDER — METOCLOPRAMIDE HCL 10 MG PO TABS
10.0000 mg | ORAL_TABLET | Freq: Three times a day (TID) | ORAL | Status: DC
  Administered 2012-09-25: 10 mg via ORAL
  Filled 2012-09-25: qty 1

## 2012-09-25 MED ORDER — INSULIN GLARGINE 100 UNIT/ML ~~LOC~~ SOLN: 15.0000 [IU] | Freq: Every day | SUBCUTANEOUS | Status: DC

## 2012-09-25 MED ORDER — LORAZEPAM 1 MG PO TABS
1.0000 mg | ORAL_TABLET | Freq: Four times a day (QID) | ORAL | Status: DC | PRN
  Administered 2012-09-25: 1 mg via ORAL
  Filled 2012-09-25: qty 1

## 2012-09-25 MED ORDER — LISINOPRIL 40 MG PO TABS
40.0000 mg | ORAL_TABLET | Freq: Every day | ORAL | Status: DC
  Administered 2012-09-25: 40 mg via ORAL
  Filled 2012-09-25: qty 1

## 2012-09-25 MED ORDER — INSULIN GLARGINE 100 UNIT/ML ~~LOC~~ SOLN: 20.0000 [IU] | Freq: Every day | SUBCUTANEOUS | Status: DC

## 2012-09-25 MED ORDER — OXYCODONE HCL 5 MG PO TABS
5.0000 mg | ORAL_TABLET | ORAL | Status: DC | PRN
  Administered 2012-09-25: 5 mg via ORAL
  Filled 2012-09-25: qty 1

## 2012-09-25 MED ORDER — LORAZEPAM 1 MG PO TABS: 1.0000 mg | ORAL_TABLET | Freq: Four times a day (QID) | ORAL | Status: DC | PRN

## 2012-09-25 MED ORDER — PROMETHAZINE HCL 25 MG PO TABS
25.0000 mg | ORAL_TABLET | Freq: Four times a day (QID) | ORAL | Status: DC | PRN
  Administered 2012-09-25: 25 mg via ORAL
  Filled 2012-09-25: qty 1

## 2012-09-25 MED ORDER — METRONIDAZOLE 500 MG PO TABS
2000.0000 mg | ORAL_TABLET | Freq: Once | ORAL | Status: AC
  Administered 2012-09-25: 2000 mg via ORAL
  Filled 2012-09-25: qty 4

## 2012-09-25 MED ORDER — HEPARIN SOD (PORK) LOCK FLUSH 100 UNIT/ML IV SOLN
500.0000 [IU] | INTRAVENOUS | Status: AC | PRN
  Administered 2012-09-25: 500 [IU]

## 2012-09-25 MED ORDER — GLUCOSE 40 % PO GEL
ORAL | Status: AC
  Filled 2012-09-25: qty 1

## 2012-09-25 MED ORDER — CARVEDILOL 25 MG PO TABS
25.0000 mg | ORAL_TABLET | Freq: Two times a day (BID) | ORAL | Status: DC
  Administered 2012-09-25: 25 mg via ORAL
  Filled 2012-09-25 (×2): qty 1

## 2012-09-25 MED ORDER — PANTOPRAZOLE SODIUM 40 MG PO TBEC: 40.0000 mg | DELAYED_RELEASE_TABLET | Freq: Every day | ORAL | Status: DC

## 2012-09-25 NOTE — Progress Notes (Signed)
Chaplain responded immediately to the nurse request after receiving a page from [phone]. Patient was alert, awake and responsive at the time of visit. Patient was very upset with boyfriend, emotional and wept half of the period Chaplain visited. Chaplain shared words of comfort and encouragement with patient. Chaplain also provided quality ministry of presence and prayed with patient. Chaplain will return in the AM to pray with patient as to honor her request. Bonney Roussel will continue to provide spiritual care to patient as needed at a later time.

## 2012-09-25 NOTE — Progress Notes (Signed)
FMTS Attending Daily Note:  [name] MD  [phone] pager  Family Practice pager:  [phone] I have seen and examined this patient and have reviewed their chart. I have discussed this patient with the resident Dr. Caryl Bis. I agree with the resident's findings, assessment and care plan.  Doing well on solid foods.  Will obtain to transition to po meds in hopes for DC home tomorrow.

## 2012-09-25 NOTE — Progress Notes (Signed)
Patient ID: [name] [name], female   DOB: 11/20/1966, 46 y.o.   MRN: [id number] FPTS Interval Progress Note:  Responded to nursing call that patient was requesting to leave emergently.  Went to discuss with patient that we would like her to stay to prevent re-admission for the same issue. Patient stated she needed to leave now as her daughter had started having contractions and she wasn't due to deliver until December. She states her daughter had previously lost 3 children due to preterm complications.  Discussed the case with Dr. Jess Barters and we felt as thought the patient was stable for discharge as she was tolerating oral intake well throughout the day today.  Advised the patient to call and make an appointment for follow-up as soon as possible.  Also, discussed with the patient that she would be discharged on a lower dose of her lantus, 20 U instead of home dose of 30 U, given her recent poor PO intake and low blood sugars overnight.  Patient verbalized understanding of this and stated she would call to make an appointment as soon as possible.  Tommi Rumps, MD PGY1, FPTS

## 2012-09-25 NOTE — Progress Notes (Signed)
Inpatient Diabetes Program Recommendations  AACE/ADA: New Consensus Statement on Inpatient Glycemic Control (2013)  Target Ranges:  Prepandial:   less than 140 mg/dL      Peak postprandial:   less than 180 mg/dL (1-2 hours)      Critically ill patients:  140 - 180 mg/dL   Reason for Visit: Hypoglycemia  yo F with a history significant for DM2 with gastroparesis presents acute onset of nausea and epigastric abdominal pain this AM 5. The pain woke her from sleep. She reports multiple episode of small NB/NB emesis. She reports chests pain and indicates epigastric area. She also reports vaginal discharge and itching. She has normal bowel movements. Last episode this AM. Denies melena. She denies fever, SOB, sick contacts. She lives alone. Last smoked marijuana two weeks ago.  Results for [name] [name], [name] [name] (MRN [id number]) as of 09/25/2012 13:50  Ref. Range 09/25/2012 08:40  Sodium Latest Range: 135-145 mEq/L 138  Potassium Latest Range: 3.5-5.1 mEq/L 3.5  Chloride Latest Range: 96-112 mEq/L 106  CO2 Latest Range: 19-32 mEq/L 24  BUN Latest Range: 6-23 mg/dL 12  Creatinine Latest Range: 0.50-1.10 mg/dL 1.38 (H)  Calcium Latest Range: 8.4-10.5 mg/dL 8.6  GFR calc non Af Amer Latest Range: >90 mL/min 45 (L)  GFR calc Af Amer Latest Range: >90 mL/min 52 (L)  Glucose Latest Range: 70-99 mg/dL 113 (H)  Results for [name] [name], [name] [name] (MRN [id number]) as of 09/25/2012 13:50  Ref. Range 09/25/2012 03:53 09/25/2012 04:15 09/25/2012 07:45 09/25/2012 08:49 09/25/2012 12:01  Glucose-Capillary Latest Range: 70-99 mg/dL 56 (L) 96 50 (L) 102 (H) 162 (H)   Inpatient Diabetes Program Recommendations Correction (SSI): Add Novolog sensitive tidwc  Note :Lantus decreased to 15 units QHS.  Will follow.

## 2012-09-25 NOTE — Progress Notes (Signed)
Chaplain visited patient in response to Pt. Request for prayer in the AM. Patient appears to be alert, responsive and peaceful. Chaplain prayed for patient and wished her God's blessings. Patient expressed her joy for Chaplain's visit. Chaplain will continue to provide spiritual care to patient as needed in a later time.

## 2012-09-25 NOTE — Progress Notes (Signed)
Hypoglycemic Event  Time:1205CBG Result:37 repeat CBG result  for verification: 42  Treatment: 15 GM carbohydrate snack x2  Symptoms: None  Follow-up CBG: Time:0028 CBG Result:83  Possible Reasons for Event: Unknown  Comments/MD notified:Dr. Perfecto Kingdom  Remember to initiate Hypoglycemia Order Set & complete

## 2012-09-25 NOTE — Discharge Summary (Signed)
Physician Discharge Summary  Patient ID: [name] [name] MRN: [id number] DOB: April 25, 1966 Age: 46 y.o.  Admit date: 09/23/2012 Discharge date: 09/25/2012 Admitting Physician: [name] Ohara McDiarmid, MD  PCP: [name] [name], [name] Dy, MD  Consultants: none     Discharge Diagnosis: Principal Problem:  *Cyclic vomiting syndrome Active Problems:  ANXIETY  HYPERTENSION, BENIGN SYSTEMIC  GERD  Hot flashes  Uncontrolled secondary diabetes mellitus with stage 3 CKD (GFR 30-59)  Vaginitis    Hospital Course 46 yo F with DM2 and history of diabetic gastroparesis/cylic vomiting presents with epigastric pain and emesis.  # Epigastric pain and emesis: Presented following acute onset of nausea, vomiting, and epigastric pain very similar to the episode that precipitated her recent hospitalization.  She was treated with IV reglan, dilaudid, phenergan, and ativan.  This helped resolve her nausea and vomiting.  Patient was able to tolerate solid foods on second day of hospitalization and was transitioned to PO oyxcodone for pain and PO reglan, phenergan, and ativan for nausea.  We also continued the patient on her PPI. Improved PO intake as patient tolerated full meal at lunch and was eating just prior to discharge.  #HTN Elevated blood pressure throughout hospitalization. Patient was unable to tolerate PO so coreg was held initially. Once tolerating PO this medication was restarted. Patient received one dose of hydralazine while in the hospital.   # DM2 With CKD: elevated CBGs initially. Ketones and glucose in urine. No acidosis or anion gap. Cr at baseline. Over night prior to discharge CBGs low into the 50's. Lantus was decreased from 20 to 15 U and SSI was held. CBG just prior to discharge was 140.   #vaginitis: trichomoniasis and BV. Given 2 g Flagyl in hospital. Urine Gc/chlamydia also collected and both were negative.  Problem List 1. Abdominal pain 2. Nausea/vomiting 3. HTN 4. DM 5. CKD 6. Vaginitis,  trichomoniasis and bacterial vaginosis         Discharge PE   Filed Vitals:   09/25/12 1430  BP: 168/68  Pulse: 76  Temp: 98.5 F (36.9 C)  Resp: 18   General: alert, cooperative, NAD, did become tearful when discussing trich and implications  HEENT: AT/[place], sclera white, MMM  CV: RRR, no murmurs  Pulm: CTAB, no wheezes or rales  Abd: +BS, soft, moderate tenderness particularly in epigastric region, no rebound or guarding  Ext: no edema  Neuro: no obvious focal deficits   Procedures/Imaging:  None     Labs  CBC  Lab 09/24/12 0500 09/23/12 1030  WBC 9.6 7.6  HGB 11.3* 12.3  HCT 32.4* 34.3*  PLT 201 221   BMET  Lab 09/25/12 0840 09/24/12 0500 09/23/12 1030  NA 138 143 137  K 3.5 3.4* 4.1  CL 106 108 102  CO2 24 25 23   BUN 12 23 24*  CREATININE 1.38* 1.50* 1.61*  CALCIUM 8.6 9.4 10.4  PROT -- -- 7.7  BILITOT -- -- 0.3  ALKPHOS -- -- 77  ALT -- -- 11  AST -- -- 14  GLUCOSE 113* 53* 218*   Results for orders placed during the hospital encounter of 09/23/12 (from the past 72 hour(s))  CBC     Status: Abnormal   Collection Time   09/23/12 10:30 AM      Component Value Range Comment   WBC 7.6  4.0 - 10.5 K/uL    RBC 3.81 (*) 3.87 - 5.11 MIL/uL    Hemoglobin 12.3  12.0 - 15.0 g/dL    HCT  34.3 (*) 36.0 - 46.0 %    MCV 90.0  78.0 - 100.0 fL    MCH 32.3  26.0 - 34.0 pg    MCHC 35.9  30.0 - 36.0 g/dL    RDW 13.5  11.5 - 15.5 %    Platelets 221  150 - 400 K/uL   COMPREHENSIVE METABOLIC PANEL     Status: Abnormal   Collection Time   09/23/12 10:30 AM      Component Value Range Comment   Sodium 137  135 - 145 mEq/L    Potassium 4.1  3.5 - 5.1 mEq/L    Chloride 102  96 - 112 mEq/L    CO2 23  19 - 32 mEq/L    Glucose, Bld 218 (*) 70 - 99 mg/dL    BUN 24 (*) 6 - 23 mg/dL    Creatinine, Ser 1.61 (*) 0.50 - 1.10 mg/dL    Calcium 10.4  8.4 - 10.5 mg/dL    Total Protein 7.7  6.0 - 8.3 g/dL    Albumin 4.1  3.5 - 5.2 g/dL    AST 14  0 - 37 U/L    ALT 11  0 - 35 U/L     Alkaline Phosphatase 77  39 - 117 U/L    Total Bilirubin 0.3  0.3 - 1.2 mg/dL    GFR calc non Af Amer 37 (*) >90 mL/min    GFR calc Af Amer 43 (*) >90 mL/min   LIPASE, BLOOD     Status: Abnormal   Collection Time   09/23/12 10:30 AM      Component Value Range Comment   Lipase 71 (*) 11 - 59 U/L   WET PREP, GENITAL     Status: Abnormal   Collection Time   09/23/12  1:01 PM      Component Value Range Comment   Yeast Wet Prep HPF POC NONE SEEN  NONE SEEN    Trich, Wet Prep MODERATE (*) NONE SEEN    Clue Cells Wet Prep HPF POC MANY (*) NONE SEEN    WBC, Wet Prep HPF POC RARE (*) NONE SEEN   GC/CHLAMYDIA PROBE AMP, GENITAL     Status: Normal   Collection Time   09/23/12  1:02 PM      Component Value Range Comment   GC Probe Amp, Genital NEGATIVE  NEGATIVE    Chlamydia, DNA Probe NEGATIVE  NEGATIVE   POCT GASTRIC OCCULT BLOOD     Status: Abnormal   Collection Time   09/23/12  4:10 PM      Component Value Range Comment   pH, Gastric NOT DONE      Occult Blood, Gastric POSITIVE (*) NEGATIVE   URINE RAPID DRUG SCREEN (HOSP PERFORMED)     Status: Abnormal   Collection Time   09/23/12  5:38 PM      Component Value Range Comment   Opiates NONE DETECTED  NONE DETECTED    Cocaine NONE DETECTED  NONE DETECTED    Benzodiazepines NONE DETECTED  NONE DETECTED    Amphetamines NONE DETECTED  NONE DETECTED    Tetrahydrocannabinol POSITIVE (*) NONE DETECTED    Barbiturates NONE DETECTED  NONE DETECTED   URINALYSIS, ROUTINE W REFLEX MICROSCOPIC     Status: Abnormal   Collection Time   09/23/12  5:38 PM      Component Value Range Comment   Color, Urine YELLOW  YELLOW    APPearance CLEAR  CLEAR    Specific Gravity,  Urine 1.014  1.005 - 1.030    pH 7.0  5.0 - 8.0    Glucose, UA >1000 (*) NEGATIVE mg/dL    Hgb urine dipstick MODERATE (*) NEGATIVE    Bilirubin Urine NEGATIVE  NEGATIVE    Ketones, ur 15 (*) NEGATIVE mg/dL    Protein, ur 100 (*) NEGATIVE mg/dL    Urobilinogen, UA 0.2  0.0 - 1.0  mg/dL    Nitrite NEGATIVE  NEGATIVE    Leukocytes, UA NEGATIVE  NEGATIVE   URINE MICROSCOPIC-ADD ON     Status: Abnormal   Collection Time   09/23/12  5:38 PM      Component Value Range Comment   Squamous Epithelial / LPF FEW (*) RARE    RBC / HPF 3-6  <3 RBC/hpf   GLUCOSE, CAPILLARY     Status: Abnormal   Collection Time   09/23/12  8:01 PM      Component Value Range Comment   Glucose-Capillary 291 (*) 70 - 99 mg/dL   GLUCOSE, CAPILLARY     Status: Abnormal   Collection Time   09/24/12 12:03 AM      Component Value Range Comment   Glucose-Capillary 160 (*) 70 - 99 mg/dL   GC/CHLAMYDIA PROBE AMP, URINE     Status: Normal   Collection Time   09/24/12  3:52 AM      Component Value Range Comment   GC Probe Amp, Urine NEGATIVE  NEGATIVE    Chlamydia, Swab/Urine, PCR NEGATIVE  NEGATIVE   GLUCOSE, CAPILLARY     Status: Normal   Collection Time   09/24/12  3:58 AM      Component Value Range Comment   Glucose-Capillary 70  70 - 99 mg/dL   BASIC METABOLIC PANEL     Status: Abnormal   Collection Time   09/24/12  5:00 AM      Component Value Range Comment   Sodium 143  135 - 145 mEq/L    Potassium 3.4 (*) 3.5 - 5.1 mEq/L DELTA CHECK NOTED   Chloride 108  96 - 112 mEq/L    CO2 25  19 - 32 mEq/L    Glucose, Bld 53 (*) 70 - 99 mg/dL    BUN 23  6 - 23 mg/dL    Creatinine, Ser 1.50 (*) 0.50 - 1.10 mg/dL    Calcium 9.4  8.4 - 10.5 mg/dL    GFR calc non Af Amer 40 (*) >90 mL/min    GFR calc Af Amer 47 (*) >90 mL/min   CBC     Status: Abnormal   Collection Time   09/24/12  5:00 AM      Component Value Range Comment   WBC 9.6  4.0 - 10.5 K/uL    RBC 3.59 (*) 3.87 - 5.11 MIL/uL    Hemoglobin 11.3 (*) 12.0 - 15.0 g/dL    HCT 32.4 (*) 36.0 - 46.0 %    MCV 90.3  78.0 - 100.0 fL    MCH 31.5  26.0 - 34.0 pg    MCHC 34.9  30.0 - 36.0 g/dL    RDW 13.5  11.5 - 15.5 %    Platelets 201  150 - 400 K/uL   GLUCOSE, CAPILLARY     Status: Abnormal   Collection Time   09/24/12  8:01 AM      Component  Value Range Comment   Glucose-Capillary 108 (*) 70 - 99 mg/dL   GLUCOSE, CAPILLARY     Status: Abnormal  Collection Time   09/24/12 12:26 PM      Component Value Range Comment   Glucose-Capillary 121 (*) 70 - 99 mg/dL   GLUCOSE, CAPILLARY     Status: Abnormal   Collection Time   09/24/12  4:18 PM      Component Value Range Comment   Glucose-Capillary 120 (*) 70 - 99 mg/dL    Comment 1 Documented in Chart      Comment 2 Notify RN     GLUCOSE, CAPILLARY     Status: Abnormal   Collection Time   09/24/12  7:12 PM      Component Value Range Comment   Glucose-Capillary 143 (*) 70 - 99 mg/dL    Comment 1 Documented in Chart      Comment 2 Notify RN     GLUCOSE, CAPILLARY     Status: Abnormal   Collection Time   09/24/12  8:04 PM      Component Value Range Comment   Glucose-Capillary 151 (*) 70 - 99 mg/dL   GLUCOSE, CAPILLARY     Status: Abnormal   Collection Time   09/24/12 11:59 PM      Component Value Range Comment   Glucose-Capillary 37 (*) 70 - 99 mg/dL    Comment 1 Notify RN     GLUCOSE, CAPILLARY     Status: Abnormal   Collection Time   09/25/12 12:03 AM      Component Value Range Comment   Glucose-Capillary 42 (*) 70 - 99 mg/dL   GLUCOSE, CAPILLARY     Status: Normal   Collection Time   09/25/12 12:28 AM      Component Value Range Comment   Glucose-Capillary 83  70 - 99 mg/dL   GLUCOSE, CAPILLARY     Status: Abnormal   Collection Time   09/25/12  3:53 AM      Component Value Range Comment   Glucose-Capillary 56 (*) 70 - 99 mg/dL   GLUCOSE, CAPILLARY     Status: Normal   Collection Time   09/25/12  4:15 AM      Component Value Range Comment   Glucose-Capillary 96  70 - 99 mg/dL   GLUCOSE, CAPILLARY     Status: Abnormal   Collection Time   09/25/12  7:45 AM      Component Value Range Comment   Glucose-Capillary 50 (*) 70 - 99 mg/dL   BASIC METABOLIC PANEL     Status: Abnormal   Collection Time   09/25/12  8:40 AM      Component Value Range Comment   Sodium 138  135 -  145 mEq/L    Potassium 3.5  3.5 - 5.1 mEq/L    Chloride 106  96 - 112 mEq/L    CO2 24  19 - 32 mEq/L    Glucose, Bld 113 (*) 70 - 99 mg/dL    BUN 12  6 - 23 mg/dL    Creatinine, Ser 1.38 (*) 0.50 - 1.10 mg/dL    Calcium 8.6  8.4 - 10.5 mg/dL    GFR calc non Af Amer 45 (*) >90 mL/min    GFR calc Af Amer 52 (*) >90 mL/min   GLUCOSE, CAPILLARY     Status: Abnormal   Collection Time   09/25/12  8:49 AM      Component Value Range Comment   Glucose-Capillary 102 (*) 70 - 99 mg/dL   GLUCOSE, CAPILLARY     Status: Abnormal   Collection Time  09/25/12 12:01 PM      Component Value Range Comment   Glucose-Capillary 162 (*) 70 - 99 mg/dL        Patient condition at time of discharge/disposition: stable  Disposition-home   Follow up issues: 1. Patient continues to smoke marijuana even though it is contributing to her cyclical vomiting. Needs frank discussion on drug use. 2. Patient had low CBGs during hospitalization while not having good PO intake.  Discharged on Lantus 20 U, home dose is 30 U, could consider restarting home dose once patient has good PO intake. 3. Vaginitis, patient treated with 2 g Flagyl PO, ask about symptom resolution  Discharge follow up:   Patient is to call the [facility] to set up a follow-up appointment later this week.   Discharge Instructions: Please refer to Patient Instructions section of EMR for full details.  Patient was counseled important signs and symptoms that should prompt return to medical care, changes in medications, dietary instructions, activity restrictions, and follow up appointments.  Significant instructions noted below:   Discharge Medications   Medication List     As of 09/25/2012  7:51 PM    CHANGE how you take these medications         insulin glargine 100 UNIT/ML injection   Commonly known as: LANTUS   Inject 20 Units into the skin daily.   What changed: dose      LORazepam 1 MG tablet   Commonly known as: ATIVAN     Take 1 tablet (1 mg total) by mouth every 6 (six) hours as needed for anxiety (nausea).   What changed: - how often to take the med - reasons to take the med      CONTINUE taking these medications         aspirin EC 81 MG tablet      carvedilol 25 MG tablet   Commonly known as: COREG   Take 1 tablet (25 mg total) by mouth 2 (two) times daily with a meal.      diclofenac sodium 1 % Gel   Commonly known as: VOLTAREN   Apply 1 application topically 4 (four) times daily.      EPIPEN 0.3 mg/0.3 mL Devi   Generic drug: EPINEPHrine      fluticasone 50 MCG/ACT nasal spray   Commonly known as: FLONASE   Place 2 sprays into the nose daily.      insulin aspart 100 UNIT/ML injection   Commonly known as: novoLOG      LIDODERM 5 %   Generic drug: lidocaine      lisinopril 40 MG tablet   Commonly known as: PRINIVIL,ZESTRIL   Take 1 tablet (40 mg total) by mouth daily.      Loratadine 10 MG Caps   Take 1 capsule (10 mg total) by mouth daily.      metoCLOPramide 10 MG tablet   Commonly known as: REGLAN      nicotine 7 mg/24hr patch   Commonly known as: NICODERM CQ - dosed in mg/24 hr   Apply 1 patch daily as needed. Remove old patch      omeprazole 20 MG capsule   Commonly known as: PRILOSEC      Oxycodone HCl 10 MG Tabs      promethazine 25 MG tablet   Commonly known as: PHENERGAN      senna 8.6 MG Tabs   Commonly known as: SENOKOT   Take 2 tablets (17.2 mg total) by mouth  2 (two) times daily. For constipation      sucralfate 1 G tablet   Commonly known as: CARAFATE   Take 1 tablet (1 g total) by mouth 4 (four) times daily.          Where to get your medications    These are the prescriptions that you need to pick up. We sent them to a specific pharmacy, so you will need to go there to get them.   [address] [place] [address]    Phone: [phone]        insulin glargine 100 UNIT/ML injection          You may get these medications from any pharmacy.         LORazepam 1 MG tablet           Tommi Rumps, MD of [name] 09/25/2012 2:34 PM

## 2012-09-25 NOTE — Progress Notes (Signed)
Hypoglycemic Event  CBG: 56  Treatment: 15 GM carbohydrate snack  Symptoms: Sweaty  Follow-up CBG: Time:1615 CBG Result: 96  Possible Reasons for Event: Medication regimen: Lantus   Comments: discussed Medication regimen with Dr. Thomes Dinning at [address]  Remember to initiate Hypoglycemia Order Set & complete

## 2012-09-25 NOTE — Progress Notes (Signed)
Patient ID: [name] [name], female   DOB: 05-19-1966, 46 y.o.   MRN: [id number] FMTS Daily Progress Note  Subjective: Doing better this morning. No vomiting since yesterday.  States nausea has improved with medications.  Objective Temp:  [98.4 F (36.9 C)-98.5 F (36.9 C)] 98.5 F (36.9 C) (09/30 0443) Pulse Rate:  [74-77] 77  (09/30 0443) Resp:  [18-20] 18  (09/30 0443) BP: (145-157)/(76-84) 157/84 mmHg (09/30 0443) SpO2:  [95 %-100 %] 100 % (09/30 0443)   Intake/Output Summary (Last 24 hours) at 09/25/12 0855 Last data filed at 09/25/12 0700  Gross per 24 hour  Intake 3802.91 ml  Output      0 ml  Net 3802.91 ml    CBG (last 3)   Basename 09/25/12 0745 09/25/12 0415 09/25/12 0353  GLUCAP 50* 96 56*    General: alert, cooperative, NAD, did become tearful when discussing trich and implications HEENT: AT/[place], sclera white, MMM CV: RRR, no murmurs Pulm: CTAB, no wheezes or rales Abd: +BS, soft, moderate tenderness particularly in epigastric region, no rebound or guarding Ext: no edema Neuro: no obvious focal deficits  Labs and Imaging  Lab 09/24/12 0500 09/23/12 1030  WBC 9.6 7.6  HGB 11.3* 12.3  HCT 32.4* 34.3*  PLT 201 221     Lab 09/24/12 0500 09/23/12 1030  NA 143 137  K 3.4* 4.1  CL 108 102  CO2 25 23  BUN 23 24*  CREATININE 1.50* 1.61*  LABGLOM -- --  GLUCOSE 53* --  CALCIUM 9.4 10.4    Assessment and Plan 46 yo F with DM2 and history of diabetic gastroparesis/cylic vomiting presents with epigastric pain and emesis.   # Epigastric pain and emesis:  A: patient afebrile and hemodynamically stable so acute infectious process/vascular process is unlikely. This is most likely recurrent cylical vomiting and gastritis. Exam is not consistent with acute abdomen.   P:  -advance diet to regular  -Pain/nausea control: with dilaudid, ativan, phenergan and Reglan. She has responded well to this regimen in the past. Seems to be working. -PPI  -monitor  electrolytes with BMP   #HTN  A: elevated BP. Unable to tolerate oral medications.  P: pain control. IV hydralazine prn. Restart meds when able to tolerate PO.  # DM2 With CKD  A: elevated CBGs initially. Ketones and glucose in urine. No acidosis or anion gap. Cr at baseline. Currently CBGs low into the 50's. P: lantus 15 q HS (decreased from home dose of 30).  Will hold SSI until tolerating PO. Will continue to follow and adjust as she tolerates PO.  #vaginitis  A: trichomoniasis and BV.  P:  Will give 2 g Flagyl in hospital given that this can cuase increased nausea  f/u urine Gc/chlamydia   FEN/GI: clear liquid diet, ADAT. IVFs. Replete K as needed  DVT PPx: lovenox.  Dispo: to home pending clinical improvement.   Tommi Rumps [phone] 09/25/2012, 8:55 AM
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
## 2012-09-26 NOTE — Discharge Summary (Signed)
Family Medicine Teaching Service  Discharge Note : Attending [name] MD Pager [phone] Inpatient Team Pager:  [phone]  I have seen and examined this patient, reviewed their chart and discussed discharge planning wit the resident at the time of discharge. I agree with the discharge plan as above.

## 2012-10-04 ENCOUNTER — Encounter: Payer: Self-pay | Admitting: Family Medicine

## 2012-10-04 ENCOUNTER — Other Ambulatory Visit: Payer: Self-pay

## 2012-10-04 ENCOUNTER — Ambulatory Visit (INDEPENDENT_AMBULATORY_CARE_PROVIDER_SITE_OTHER): Payer: Medicare Other | Admitting: Family Medicine

## 2012-10-04 ENCOUNTER — Encounter (HOSPITAL_COMMUNITY): Payer: Self-pay | Admitting: Emergency Medicine

## 2012-10-04 ENCOUNTER — Emergency Department (HOSPITAL_COMMUNITY)
Admission: EM | Admit: 2012-10-04 | Discharge: 2012-10-05 | Disposition: A | Discharge status: Home / Self Care | Payer: Medicare Other | Attending: Emergency Medicine | Admitting: Emergency Medicine

## 2012-10-04 VITALS — BP 192/76 | HR 86 | Wt 158.0 lb

## 2012-10-04 DIAGNOSIS — R1115 Cyclical vomiting syndrome unrelated to migraine: Secondary | ICD-10-CM | POA: Insufficient documentation

## 2012-10-04 DIAGNOSIS — E1142 Type 2 diabetes mellitus with diabetic polyneuropathy: Secondary | ICD-10-CM | POA: Insufficient documentation

## 2012-10-04 DIAGNOSIS — R1013 Epigastric pain: Secondary | ICD-10-CM | POA: Insufficient documentation

## 2012-10-04 DIAGNOSIS — F329 Major depressive disorder, single episode, unspecified: Secondary | ICD-10-CM | POA: Diagnosis not present

## 2012-10-04 DIAGNOSIS — F17200 Nicotine dependence, unspecified, uncomplicated: Secondary | ICD-10-CM | POA: Insufficient documentation

## 2012-10-04 DIAGNOSIS — F3289 Other specified depressive episodes: Secondary | ICD-10-CM | POA: Insufficient documentation

## 2012-10-04 DIAGNOSIS — E1149 Type 2 diabetes mellitus with other diabetic neurological complication: Secondary | ICD-10-CM | POA: Insufficient documentation

## 2012-10-04 DIAGNOSIS — A599 Trichomoniasis, unspecified: Secondary | ICD-10-CM

## 2012-10-04 DIAGNOSIS — K2990 Gastroduodenitis, unspecified, without bleeding: Secondary | ICD-10-CM | POA: Diagnosis not present

## 2012-10-04 DIAGNOSIS — I10 Essential (primary) hypertension: Secondary | ICD-10-CM | POA: Insufficient documentation

## 2012-10-04 DIAGNOSIS — Z794 Long term (current) use of insulin: Secondary | ICD-10-CM | POA: Insufficient documentation

## 2012-10-04 DIAGNOSIS — F411 Generalized anxiety disorder: Secondary | ICD-10-CM | POA: Insufficient documentation

## 2012-10-04 DIAGNOSIS — K2970 Gastritis, unspecified, without bleeding: Secondary | ICD-10-CM | POA: Diagnosis not present

## 2012-10-04 DIAGNOSIS — K3184 Gastroparesis: Secondary | ICD-10-CM | POA: Diagnosis not present

## 2012-10-04 DIAGNOSIS — Z79899 Other long term (current) drug therapy: Secondary | ICD-10-CM | POA: Diagnosis not present

## 2012-10-04 DIAGNOSIS — B9689 Other specified bacterial agents as the cause of diseases classified elsewhere: Secondary | ICD-10-CM

## 2012-10-04 DIAGNOSIS — N760 Acute vaginitis: Secondary | ICD-10-CM | POA: Diagnosis not present

## 2012-10-04 DIAGNOSIS — I252 Old myocardial infarction: Secondary | ICD-10-CM | POA: Diagnosis not present

## 2012-10-04 DIAGNOSIS — Z7982 Long term (current) use of aspirin: Secondary | ICD-10-CM | POA: Insufficient documentation

## 2012-10-04 DIAGNOSIS — R1110 Vomiting, unspecified: Secondary | ICD-10-CM

## 2012-10-04 DIAGNOSIS — R112 Nausea with vomiting, unspecified: Secondary | ICD-10-CM | POA: Diagnosis not present

## 2012-10-04 DIAGNOSIS — K219 Gastro-esophageal reflux disease without esophagitis: Secondary | ICD-10-CM | POA: Diagnosis not present

## 2012-10-04 MED ORDER — PROMETHAZINE HCL 25 MG/ML IJ SOLN
25.0000 mg | Freq: Once | INTRAMUSCULAR | Status: AC
  Administered 2012-10-04: 25 mg via INTRAMUSCULAR

## 2012-10-04 MED ORDER — PANTOPRAZOLE SODIUM 40 MG IV SOLR
40.0000 mg | Freq: Once | INTRAVENOUS | Status: AC
  Administered 2012-10-04: 40 mg via INTRAVENOUS
  Filled 2012-10-04: qty 40

## 2012-10-04 MED ORDER — LORAZEPAM 1 MG PO TABS: 1.0000 mg | ORAL_TABLET | Freq: Four times a day (QID) | ORAL | Status: DC | PRN

## 2012-10-04 MED ORDER — ONDANSETRON HCL 4 MG/2ML IJ SOLN
INTRAMUSCULAR | Status: AC
  Filled 2012-10-04: qty 2

## 2012-10-04 MED ORDER — PROMETHAZINE HCL 25 MG/ML IJ SOLN
25.0000 mg | Freq: Once | INTRAMUSCULAR | Status: AC
  Administered 2012-10-04: 25 mg via INTRAVENOUS
  Filled 2012-10-04 (×2): qty 1

## 2012-10-04 MED ORDER — LORAZEPAM 2 MG/ML IJ SOLN
1.0000 mg | Freq: Once | INTRAMUSCULAR | Status: AC
  Administered 2012-10-04: 1 mg via INTRAVENOUS
  Filled 2012-10-04: qty 1

## 2012-10-04 MED ORDER — MORPHINE SULFATE 10 MG/ML IJ SOLN
10.0000 mg | Freq: Once | INTRAMUSCULAR | Status: AC
  Administered 2012-10-04: 10 mg via INTRAMUSCULAR

## 2012-10-04 MED ORDER — HYDROMORPHONE HCL PF 1 MG/ML IJ SOLN
1.0000 mg | Freq: Once | INTRAMUSCULAR | Status: AC
  Administered 2012-10-04: 1 mg via INTRAVENOUS
  Filled 2012-10-04: qty 1

## 2012-10-04 MED ORDER — SODIUM CHLORIDE 0.9 % IV BOLUS (SEPSIS)
1000.0000 mL | Freq: Once | INTRAVENOUS | Status: AC
  Administered 2012-10-04: 1000 mL via INTRAVENOUS

## 2012-10-04 NOTE — ED Notes (Signed)
Pt presented to ED with nausea and vomiting .According to pt it started this morning.Vomitous is brownish in colour.

## 2012-10-04 NOTE — ED Provider Notes (Signed)
History     CSN: [id number]  Arrival date & time 10/04/12  1726   First MD Initiated Contact with Patient 10/04/12 2036      Chief Complaint  Patient presents with  . Emesis    (Consider location/radiation/quality/duration/timing/severity/associated sxs/prior treatment) HPI Comments: Patient with a history of Pancreatitis and Cyclic Vomiting Syndrome presents today with a chief complaint of nausea, vomiting, and epigastric abdominal pain.  She reports that she began vomiting today and has vomited several times.  She has numerous hospital admissions for the same.  She was recently admitted and discharged on 09/25/12.  Her symptoms were thought to be related to marijuana use at this time.  Patient denies any marijuana use since her last admission.  She reports that her symptoms today are no different than the symptoms that she has had in the past.  She tried taking Phenergan and Oxycodone at home for her symptoms, but does not feel that it helped.  She denies any diarrhea.  Last BM was two days ago and was normal.  She denies fever or chills.  Denies chest pain or SOB.  Patient states that she has had some white colored vaginal discharge for the past couple of weeks.  She was recently treated with Flagyl for Trichomonas and BV, however, she does not feel that her vaginal discharged has resolved.  She is requesting a pelvic exam to be sure that the BV and Trichomonas has cleared.  The history is provided by the patient.    Past Medical History  Diagnosis Date  . PANCREATITIS 05/09/2008  . NEUROPATHY, DIABETIC 02/23/2007  . Cyclic vomiting syndrome   . Hypertension   . Complication of anesthesia     "problems waking up"  . Heart murmur   . Myocardial infarction 07/2007    "they say I've had a silent one; I don't know"  . Pneumonia   . Type II diabetes mellitus   . Anemia   . Blood transfusion   . GERD (gastroesophageal reflux disease)   . Anxiety   . Depression   . Diabetic  gastroparesis     /e-chart    Past Surgical History  Procedure Date  . Port-a-cath placement ~ 2008    right chest; "poor access; frequent hospitalizations"  . Laparotomy and lysis of adhesions   . Abdominal hysterectomy 02/2002  . Cholecystectomy 1980's    Family History  Problem Relation Age of Onset  . Diabetes type II Mother   . Diabetes Mother   . Hypertension Mother   . Diabetes type II Sister   . Diabetes Sister   . Hypertension Sister   . Hypertension Brother     History  Substance Use Topics  . Smoking status: Current Some Day Smoker -- 0.5 packs/day for 22 years    Types: Cigarettes    Last Attempt to Quit: 02/22/2012  . Smokeless tobacco: Never Used  . Alcohol Use: No    OB History    Grav Para Term Preterm Abortions TAB SAB Ect Mult Living                  Review of Systems  Constitutional: Positive for diaphoresis. Negative for fever and chills.  Respiratory: Negative for shortness of breath.   Cardiovascular: Negative for chest pain.  Gastrointestinal: Positive for nausea, vomiting and abdominal pain. Negative for diarrhea, constipation, blood in stool and abdominal distention.  Genitourinary: Positive for frequency. Negative for dysuria, hematuria, vaginal bleeding, vaginal pain and pelvic pain.  Neurological: Negative for dizziness, syncope and light-headedness.    Allergies  Erythromycin; Acetaminophen; and Oxycodone-acetaminophen  Home Medications   Current Outpatient Rx  Name Route Sig Dispense Refill  . ASPIRIN EC 81 MG PO TBEC Oral Take 81 mg by mouth daily.    Marland Kitchen CARVEDILOL 25 MG PO TABS Oral Take 1 tablet (25 mg total) by mouth 2 (two) times daily with a meal. 30 tablet 11  . DICLOFENAC SODIUM 1 % TD GEL Topical Apply 1 application topically 4 (four) times daily. 100 g 3  . EPINEPHRINE 0.3 MG/0.3ML IJ DEVI Intramuscular Inject 0.3 mg into the muscle once. as needed for severe anaphylaxis.    Marland Kitchen FLUTICASONE PROPIONATE 50 MCG/ACT NA SUSP  Nasal Place 2 sprays into the nose daily. 16 g 6  . LORAZEPAM 1 MG PO TABS Oral Take 1 tablet (1 mg total) by mouth every 6 (six) hours as needed for anxiety (nausea). 20 tablet 0  . OXYCODONE HCL 10 MG PO TABS Oral Take 10 mg by mouth every 4 (four) hours as needed. For pain.    Marland Kitchen PROMETHAZINE HCL 25 MG PO TABS Oral Take 25 mg by mouth every 6 (six) hours as needed. For nausea    . INSULIN ASPART 100 UNIT/ML Gabbs SOLN Subcutaneous Inject 4-8 Units into the skin 3 (three) times daily before meals. Sliding scale    . INSULIN GLARGINE 100 UNIT/ML Humphrey SOLN Subcutaneous Inject 20 Units into the skin daily. 10 mL 2  . LIDODERM 5 % EX PTCH      . LISINOPRIL 40 MG PO TABS Oral Take 1 tablet (40 mg total) by mouth daily. 30 tablet 11  . LORATADINE 10 MG PO CAPS Oral Take 1 capsule (10 mg total) by mouth daily. 30 each 5  . METOCLOPRAMIDE HCL 10 MG PO TABS      . NICOTINE 7 MG/24HR TD PT24  Apply 1 patch daily as needed. Remove old patch 30 patch 2  . OMEPRAZOLE 20 MG PO CPDR      . SENNA 8.6 MG PO TABS Oral Take 2 tablets (17.2 mg total) by mouth 2 (two) times daily. For constipation 60 each 3  . SUCRALFATE 1 G PO TABS Oral Take 1 tablet (1 g total) by mouth 4 (four) times daily. 120 tablet 3    RX SIG: TAKE 1 TABLET BY MOUTH FOUR TIMES A DAY(WI ..Marland Kitchen    BP 124/79  Pulse 88  Temp 98.1 F (36.7 C) (Axillary)  Resp 14  SpO2 97%  Physical Exam  Nursing note and vitals reviewed. Constitutional: She appears well-developed and well-nourished.  HENT:  Head: Normocephalic and atraumatic.  Mouth/Throat: Oropharynx is clear and moist.  Cardiovascular: Normal rate, regular rhythm and normal heart sounds.   Pulmonary/Chest: Effort normal and breath sounds normal.  Abdominal: Soft. Bowel sounds are normal. She exhibits no distension and no mass. There is tenderness in the epigastric area. There is no rigidity, no rebound, no guarding, no tenderness at McBurney's point and negative Murphy's sign.    Genitourinary: Vagina normal. Cervix exhibits discharge. Cervix exhibits no motion tenderness. Right adnexum displays no mass, no tenderness and no fullness. Left adnexum displays no mass, no tenderness and no fullness.       Whitish colored discharge in the vaginal vault.  Neurological: She is alert.  Skin: Skin is warm. She is diaphoretic.  Psychiatric: She has a normal mood and affect.    ED Course  Procedures (including critical care time)  Labs Reviewed  CBC WITH DIFFERENTIAL  COMPREHENSIVE METABOLIC PANEL  LIPASE, BLOOD  URINALYSIS, MICROSCOPIC ONLY   No results found.   No diagnosis found.   Date: 10/04/2012  Rate: 103  Rhythm: sinus tachycardia  QRS Axis: normal  Intervals: normal  ST/T Wave abnormalities: normal  Conduction Disutrbances:none  Narrative Interpretation:   Old EKG Reviewed: unchanged  10:27 PM Reassessed patient.  Patient is resting comfortably at this time. 11:23 PM Reassessed patient.  Patient is resting comfortably.  No active vomiting. 12:50 AM Reassessed patient.  Patient is resting comfortably.  No active vomiting. 1:41 AM Patient able to tolerate po liquids. 1:45 AM Patient signed out to [employer], PA-C.  Labs pending.    MDM  Patient with a history of Cyclic Vomiting Syndrome and chronic abdominal pain presents today with epigastric abdominal pain and vomiting.  Symptoms improved while in the ED and patient able to tolerate PO liquids.  Symptoms similar to symptoms that she has had in the past.  Barton Dubois will follow up on the lab results.  Plan is for patient to be discharged home as long as the labs are unremarkable.        Sherlyn Lees Tenkiller, PA-C 10/05/12 1258

## 2012-10-04 NOTE — ED Notes (Signed)
Per EMS, cyclic vomiting syndrome-saw pcp today, had morphine and phenergan; diaphoretic, htn, and cont to vomit; ST on monitor; 240/130 BP, HR 100, RR 22 spo2 98% on RA

## 2012-10-04 NOTE — Assessment & Plan Note (Signed)
Acute exacerbation.   Seems very consistent with previous episodes without evidence of obstruction or focal gi problem or infection.  Treat symptomatically and hopefully can prevent long exacerbation

## 2012-10-04 NOTE — Progress Notes (Signed)
  Subjective:    Patient ID: [name] [name], female    DOB: 06/19/1966, 46 y.o.   MRN: [id number]  HPI  Vomiting Abdomen Pain Recently discharged for this on 9-30.  Was doing ok with mild nausea and pain until this AM when started vomiting.  Pain has started worsening.  Very similar to her previous episodes of these attacks. Was out of lorazepam and unable to keep down oral pheneragan or reglan.  No bleeding or fever or diarrhea     Review of Systems     Objective:   Physical Exam Moderate distress lying on bed with intermittent retching.  Becomes agitated and shaky when discussing her condition.  Able to converse and interact well when calms down Abdomen: soft and diffusely mildly tender without masses, organomegaly or hernias noted.  No guarding or rebound  Mucous Membranes are moist Spitting up clear sputum without vomitus  Heart - Regular rate and rhythm.  No murmurs, gallops or rubs.    Lungs:  Normal respiratory effort, chest expands symmetrically. Lungs are clear to auscultation, no crackles or wheezes. No CVAT   Office Course Gave injections of morphine and phenergan.   Her pain was subsequenlty decreased and she felt less nauseous and was spitting up less     Assessment & Plan:

## 2012-10-04 NOTE — ED Notes (Signed)
Pt arrived by EMS, Pt has cyclic vomiting - states it runs her BP up. Pt went to dr and received a "shot of phenergan and morphine" but has not had any relief.

## 2012-10-04 NOTE — Patient Instructions (Addendum)
Go home and take your Reglan and Lorazepam  Take small frequent sips of liquid do not eat for the next 24 hours  If you are not able to keep down liquids and are getting dehydrated go to the ER  Make an appointment so when you are feeling better we can make sure your infection is gone

## 2012-10-04 NOTE — ED Notes (Signed)
IV team paged to acess porta cath

## 2012-10-05 LAB — CBC WITH DIFFERENTIAL/PLATELET
Eosinophils Relative: 0 % (ref 0–5)
Lymphocytes Relative: 21 % (ref 12–46)
Lymphs Abs: 2.1 10*3/uL (ref 0.7–4.0)
MCV: 90.2 fL (ref 78.0–100.0)
Neutrophils Relative %: 74 % (ref 43–77)
Platelets: 258 10*3/uL (ref 150–400)
RBC: 3.97 MIL/uL (ref 3.87–5.11)
WBC: 10.1 10*3/uL (ref 4.0–10.5)

## 2012-10-05 LAB — COMPREHENSIVE METABOLIC PANEL
ALT: 20 U/L (ref 0–35)
Alkaline Phosphatase: 96 U/L (ref 39–117)
CO2: 26 mEq/L (ref 19–32)
GFR calc Af Amer: 35 mL/min — ABNORMAL LOW (ref >90)
GFR calc non Af Amer: 30 mL/min — ABNORMAL LOW (ref >90)
Glucose, Bld: 286 mg/dL — ABNORMAL HIGH (ref 70–99)
Potassium: 4 mEq/L (ref 3.5–5.1)
Sodium: 138 mEq/L (ref 135–145)

## 2012-10-05 LAB — GC/CHLAMYDIA PROBE AMP, GENITAL
Chlamydia, DNA Probe: NEGATIVE
GC Probe Amp, Genital: NEGATIVE

## 2012-10-05 LAB — WET PREP, GENITAL

## 2012-10-05 MED ORDER — HEPARIN SOD (PORK) LOCK FLUSH 100 UNIT/ML IV SOLN
500.0000 [IU] | INTRAVENOUS | Status: AC | PRN
  Administered 2012-10-05: 500 [IU]

## 2012-10-05 MED ORDER — SODIUM CHLORIDE 0.9 % IJ SOLN
10.0000 mL | INTRAMUSCULAR | Status: DC | PRN
  Administered 2012-10-05: 10 mL

## 2012-10-05 MED ORDER — METRONIDAZOLE 500 MG PO TABS: 500.0000 mg | ORAL_TABLET | Freq: Two times a day (BID) | ORAL | Status: DC

## 2012-10-05 NOTE — ED Notes (Signed)
Pt for discharge.Vital signs stable and GCS 15

## 2012-10-05 NOTE — ED Provider Notes (Signed)
Medical screening examination/treatment/procedure(s) were performed by non-physician practitioner and as supervising physician I was immediately available for consultation/collaboration.  Veryl Speak, MD 10/05/12 1905

## 2012-10-05 NOTE — ED Notes (Signed)
Pt unable to void at this time. 

## 2012-10-05 NOTE — ED Provider Notes (Signed)
Pt received from [employer], PA-C.  Labs unremarkable w/ exception of wet prep which is positive for clue cells and trich.  Results discussed w/ pt.  She continues to feel well and is tolerating pos.  D/c'd home w/ 7 days of flagyl.  Return precautions discussed.   Remer Macho, [place] 10/05/12 [phone]

## 2012-10-05 NOTE — ED Provider Notes (Signed)
Medical screening examination/treatment/procedure(s) were performed by non-physician practitioner and as supervising physician I was immediately available for consultation/collaboration.   Ezequiel Essex, MD 10/05/12 1520

## 2012-10-10 ENCOUNTER — Telehealth: Payer: Self-pay | Admitting: Family Medicine

## 2012-10-10 MED ORDER — FLUCONAZOLE 150 MG PO TABS: 150.0000 mg | ORAL_TABLET | Freq: Once | ORAL | Status: DC

## 2012-10-10 NOTE — Telephone Encounter (Signed)
Notified patient Rx sent

## 2012-10-10 NOTE — Telephone Encounter (Signed)
Pt has been on ABX and now has yeast inf - wants to know if something can be called in  [place]

## 2012-10-10 NOTE — Telephone Encounter (Signed)
Sxs started yesterday.  On abx since 5 days.  C/o itching with "cottage cheese" discharge.  Would like rx for yeast infection.  Will route request to Dr. Verdie Drown and call patient back.  Nolene Ebbs, RN

## 2012-10-18 ENCOUNTER — Encounter: Payer: Self-pay | Admitting: Family Medicine

## 2012-10-18 ENCOUNTER — Ambulatory Visit (INDEPENDENT_AMBULATORY_CARE_PROVIDER_SITE_OTHER): Payer: Medicare Other | Admitting: Family Medicine

## 2012-10-18 VITALS — BP 136/78 | HR 69 | Temp 98.3°F | Ht 61.0 in | Wt 169.0 lb

## 2012-10-18 DIAGNOSIS — N183 Chronic kidney disease, stage 3 unspecified: Secondary | ICD-10-CM | POA: Diagnosis not present

## 2012-10-18 DIAGNOSIS — R1115 Cyclical vomiting syndrome unrelated to migraine: Secondary | ICD-10-CM

## 2012-10-18 DIAGNOSIS — E1329 Other specified diabetes mellitus with other diabetic kidney complication: Secondary | ICD-10-CM | POA: Diagnosis not present

## 2012-10-18 DIAGNOSIS — E1365 Other specified diabetes mellitus with hyperglycemia: Secondary | ICD-10-CM

## 2012-10-18 DIAGNOSIS — E1165 Type 2 diabetes mellitus with hyperglycemia: Secondary | ICD-10-CM

## 2012-10-18 DIAGNOSIS — E1322 Other specified diabetes mellitus with diabetic chronic kidney disease: Secondary | ICD-10-CM

## 2012-10-18 DIAGNOSIS — E118 Type 2 diabetes mellitus with unspecified complications: Secondary | ICD-10-CM | POA: Diagnosis not present

## 2012-10-18 DIAGNOSIS — IMO0002 Reserved for concepts with insufficient information to code with codable children: Secondary | ICD-10-CM

## 2012-10-18 MED ORDER — INSULIN GLARGINE 100 UNIT/ML ~~LOC~~ SOLN: 20.0000 [IU] | Freq: Every day | SUBCUTANEOUS | Status: DC

## 2012-10-18 MED ORDER — LORAZEPAM 1 MG PO TABS: 1.0000 mg | ORAL_TABLET | Freq: Two times a day (BID) | ORAL | Status: DC | PRN

## 2012-10-18 MED ORDER — SUCRALFATE 1 G PO TABS: 1.0000 g | ORAL_TABLET | Freq: Four times a day (QID) | ORAL | Status: DC | PRN

## 2012-10-18 MED ORDER — OXYCODONE HCL 10 MG PO TABS: 10.0000 mg | ORAL_TABLET | Freq: Three times a day (TID) | ORAL | Status: DC | PRN

## 2012-10-18 MED ORDER — LISINOPRIL 40 MG PO TABS: 40.0000 mg | ORAL_TABLET | Freq: Every day | ORAL | Status: DC

## 2012-10-18 MED ORDER — METOCLOPRAMIDE HCL 10 MG PO TABS: 10.0000 mg | ORAL_TABLET | Freq: Four times a day (QID) | ORAL | Status: DC | PRN

## 2012-10-18 MED ORDER — CYCLOBENZAPRINE HCL 10 MG PO TABS: 10.0000 mg | ORAL_TABLET | Freq: Three times a day (TID) | ORAL | Status: DC | PRN

## 2012-10-18 MED ORDER — PROMETHAZINE HCL 25 MG PO TABS: 25.0000 mg | ORAL_TABLET | Freq: Four times a day (QID) | ORAL | Status: DC | PRN

## 2012-10-18 NOTE — Assessment & Plan Note (Signed)
Controlled with prophylactic Ativan, Reglan, and oxycodone.  Continue.

## 2012-10-18 NOTE — Assessment & Plan Note (Signed)
Increase Lantus from 30 to 40 units qAM and continue Novolog SSI.

## 2012-10-18 NOTE — Progress Notes (Signed)
  Subjective:    Patient ID: [name] [name], female    DOB: 04-02-1966, 46 y.o.   MRN: [id number]  HPI # Cyclical vomiting syndrome No exacerbation since hospitalization end of September Daily oxycodone tid, Ativan bid, and Reglan bid help  # Diabetes Her sugars have been in the 200s most of the time. These are random measurements.  Denies any values below 100.   Review of Systems   Allergies, medication, past medical history reviewed.      Objective:   Physical Exam GEN: NAD CV: RRR, normal S1/S2, no murmurs/gallops PULM: NI WOB; CTAB ABD: soft, mild diffuse tenderness throughout without guarding or rebound, NABS, obese EXT: no edema SKIN: 3 sec cap refill, warm dry     Assessment & Plan:

## 2012-10-18 NOTE — Patient Instructions (Addendum)
Increase Lantus to 40 units in the morning Continue Novolog sliding scale  Check sugars in the morning (before you eat) and after your biggest meal Bring this log with you to your next visit  If you are needing more oxycodone than 3 x a day, please make an appointment to see me   Follow-up in 2 weeks to discuss your back pain and diabete

## 2012-11-03 ENCOUNTER — Ambulatory Visit: Payer: Medicare Other | Admitting: Family Medicine

## 2012-11-07 ENCOUNTER — Ambulatory Visit (INDEPENDENT_AMBULATORY_CARE_PROVIDER_SITE_OTHER): Payer: Medicare Other | Admitting: Family Medicine

## 2012-11-07 ENCOUNTER — Encounter: Payer: Self-pay | Admitting: Family Medicine

## 2012-11-07 VITALS — BP 135/79 | HR 90 | Temp 98.4°F | Ht 61.0 in | Wt 166.0 lb

## 2012-11-07 DIAGNOSIS — E118 Type 2 diabetes mellitus with unspecified complications: Secondary | ICD-10-CM

## 2012-11-07 DIAGNOSIS — F339 Major depressive disorder, recurrent, unspecified: Secondary | ICD-10-CM | POA: Diagnosis not present

## 2012-11-07 DIAGNOSIS — Z609 Problem related to social environment, unspecified: Secondary | ICD-10-CM

## 2012-11-07 DIAGNOSIS — I10 Essential (primary) hypertension: Secondary | ICD-10-CM

## 2012-11-07 DIAGNOSIS — E1165 Type 2 diabetes mellitus with hyperglycemia: Secondary | ICD-10-CM | POA: Diagnosis not present

## 2012-11-07 DIAGNOSIS — Z7289 Other problems related to lifestyle: Secondary | ICD-10-CM | POA: Diagnosis not present

## 2012-11-07 MED ORDER — LORAZEPAM 1 MG PO TABS: 1.0000 mg | ORAL_TABLET | Freq: Two times a day (BID) | ORAL | Status: DC | PRN

## 2012-11-07 MED ORDER — INSULIN GLARGINE 100 UNIT/ML ~~LOC~~ SOLN: 40.0000 [IU] | Freq: Every day | SUBCUTANEOUS | Status: DC

## 2012-11-07 MED ORDER — OXYCODONE HCL 10 MG PO TABS: 10.0000 mg | ORAL_TABLET | Freq: Three times a day (TID) | ORAL | Status: DC | PRN

## 2012-11-07 NOTE — Progress Notes (Signed)
  Subjective:    Patient ID: [name] [name], female    DOB: 30-Jun-1966, 46 y.o.   MRN: [id number]  HPI # Diabetes Since 10/24, she reports her fasting sugars have been < 120 and her afternoon sugars (usually after her biggest meal) have been <176.  She reports compliance with her insulin regimen.   # High risk social situation She received food stamps  # Hypertension Compliant with medications ROS: no chest pain, dyspnea  Review of Systems  Allergies, medication, past medical history reviewed.      Objective:   Physical Exam GEN: NAD; well-appearing PSYCH: more positive demeanor today CV: RRR FEET: calluses first metatarsal; toenails normal; no other foot lesions    Assessment & Plan:

## 2012-11-07 NOTE — Assessment & Plan Note (Signed)
She is doing well without medications currently despite being under family (daughter is pregnant) and relationship (taking a break from a boyfriend) stressors. She appears positive and significantly less depressed than usual today.

## 2012-11-07 NOTE — Assessment & Plan Note (Signed)
She received food stamps.

## 2012-11-07 NOTE — Assessment & Plan Note (Signed)
Controlled. Continue lisinopril and Coreg 25 bid.

## 2012-11-07 NOTE — Assessment & Plan Note (Signed)
Her sugars over the past 3 weeks have been very well controlled with Lantus 40 and SS Novolog (4-8 units tid wc). We will continue current regimen and defer checking HgbA1c for 1 month. Last one was uncontrolled at 10.9 (07/2011). Continue ACEi for renal protection and blood pressure and aspirin 81.

## 2012-11-10 ENCOUNTER — Other Ambulatory Visit: Payer: Self-pay | Admitting: Family Medicine

## 2012-12-15 ENCOUNTER — Encounter: Payer: Self-pay | Admitting: Family Medicine

## 2012-12-15 ENCOUNTER — Ambulatory Visit (INDEPENDENT_AMBULATORY_CARE_PROVIDER_SITE_OTHER): Payer: Medicare Other | Admitting: Family Medicine

## 2012-12-15 VITALS — BP 102/62 | HR 71 | Temp 97.7°F | Ht 61.0 in | Wt 174.2 lb

## 2012-12-15 DIAGNOSIS — F17200 Nicotine dependence, unspecified, uncomplicated: Secondary | ICD-10-CM

## 2012-12-15 DIAGNOSIS — R232 Flushing: Secondary | ICD-10-CM

## 2012-12-15 DIAGNOSIS — N951 Menopausal and female climacteric states: Secondary | ICD-10-CM

## 2012-12-15 MED ORDER — LORAZEPAM 1 MG PO TABS: 1.0000 mg | ORAL_TABLET | Freq: Two times a day (BID) | ORAL | Status: DC | PRN

## 2012-12-15 MED ORDER — OXYCODONE HCL 10 MG PO TABS: 10.0000 mg | ORAL_TABLET | Freq: Three times a day (TID) | ORAL | Status: DC | PRN

## 2012-12-15 MED ORDER — VENLAFAXINE HCL 37.5 MG PO TABS: ORAL_TABLET | ORAL | Status: DC

## 2012-12-15 MED ORDER — PROMETHAZINE HCL 25 MG PO TABS: 25.0000 mg | ORAL_TABLET | Freq: Four times a day (QID) | ORAL | Status: DC | PRN

## 2012-12-15 NOTE — Assessment & Plan Note (Signed)
She says that she is cutting back, but it was written that she was on fewer cigarettes in the past. She has still cut back from 1/3 ppd which she was using recently. She would like to try to continue to quit back using NRT (patches) as needed. She knows not to use patches and smoke simultaneously.

## 2012-12-15 NOTE — Patient Instructions (Addendum)
For Effexor for hot flashes  Follow-up in 2 weeks   Menopause SYMPTOMS   Hot flashes.  Night sweats.  Decrease in sex drive.  Vaginal dryness and thinning of the vagina causing painful intercourse.  Dryness of the skin and developing wrinkles.  Headaches.  Tiredness.  Irritability.  Memory problems.  Weight gain.  Bladder infections.  Hair growth of the face and chest.  Infertility. More serious symptoms include:  Loss of bone (osteoporosis) causing breaks (fractures).  Depression.  Hardening and narrowing of the arteries (atherosclerosis) causing heart attacks and strokes. DIAGNOSIS   When the menstrual periods have stopped for 12 straight months.  Physical exam.  Hormone studies of the blood. TREATMENT  There are many treatment choices and nearly as many questions about them. The decisions to treat or not to treat menopausal changes is an individual choice made with your caregiver. Your caregiver can discuss the treatments with you. Together, you can decide which treatment will work best for you. Your treatment choices may include:   Hormone therapy (estorgen and progesterone).  Non-hormonal medications.  Treating the individual symptoms with medication (for example antidepressants for depression).  Herbal medications that may help specific symptoms.  Counseling by a psychiatrist or psychologist.  Group therapy.  Lifestyle changes including:  Eating healthy.  Regular exercise.  Limiting caffeine and alcohol.  Stress management and meditation.  No treatment. HOME CARE INSTRUCTIONS   Take the medication your caregiver gives you as directed.  Get plenty of sleep and rest.  Exercise regularly.  Eat a diet that contains calcium (good for the bones) and soy products (acts like estrogen hormone).  Avoid alcoholic beverages.  Do not smoke.  If you have hot flashes, dress in layers.  Take supplements, calcium and vitamin D to strengthen  bones.  You can use over-the-counter lubricants or moisturizers for vaginal dryness.  Group therapy is sometimes very helpful.  Acupuncture may be helpful in some cases. SEEK MEDICAL CARE IF:   You are not sure you are in menopause.  You are having menopausal symptoms and need advice and treatment.  You are still having menstrual periods after age 58.  You have pain with intercourse.  Menopause is complete (no menstrual period for 12 months) and you develop vaginal bleeding.  You need a referral to a specialist (gynecologist, psychiatrist or psychologist) for treatment. SEEK IMMEDIATE MEDICAL CARE IF:   You have severe depression.  You have excessive vaginal bleeding.  You fell and think you have a broken bone.  You have pain when you urinate.  You develop leg or chest pain.  You have a fast pounding heart beat (palpitations).  You have severe headaches.  You develop vision problems.  You feel a lump in your breast.  You have abdominal pain or severe indigestion.

## 2012-12-15 NOTE — Assessment & Plan Note (Signed)
She will try Effexor. We discussed homeopathic remedies such as black cohosh. She is interested in trying. Relative contraindications for HRT due to her history of tobacco use. She is cutting back. If she is able to quit and Effexor does not seem to be helping, short-term HRT may be reasonable alternative. Follow-up in 4 weeks.

## 2012-12-15 NOTE — Progress Notes (Signed)
  Subjective:    Patient ID: [name] [name], female    DOB: 1966/05/12, 46 y.o.   MRN: [id number]  HPI She would like to discuss her hot flashes today.  She had a hysterectomy and had one ovary removed about 10 years ago. Since then, she has experienced hot flashes, however, recently it has been getting much worse. These episodes occur suddenly, at least once a day, and she is now hesitant to go out in public (e.g., restaurants) due to concern that she may have a hot flash. Using condiments such as mustard or ketchup seem to bring on the hot flashes.   She has not tried any medications for the hot flashes.   ROS: denies vaginal dryness  Review of Systems Denies nausea, worsening of stressors  Allergies, medication, past medical history reviewed.  Tobacco use--3 cigarettes daily     Objective:   Physical Exam Gen: NAD; well-appearing PSYCH: pleasant, engaged and normally conversant, appropriate to questions, alert and oriented SKIN: warm, dry    Assessment & Plan:

## 2013-01-16 ENCOUNTER — Other Ambulatory Visit: Payer: Self-pay | Admitting: Family Medicine

## 2013-01-17 ENCOUNTER — Other Ambulatory Visit (HOSPITAL_COMMUNITY)
Admission: RE | Admit: 2013-01-17 | Discharge: 2013-01-17 | Disposition: A | Discharge status: Home / Self Care | Payer: Medicare Other | Source: Ambulatory Visit | Attending: Family Medicine | Admitting: Family Medicine

## 2013-01-17 ENCOUNTER — Ambulatory Visit (INDEPENDENT_AMBULATORY_CARE_PROVIDER_SITE_OTHER): Payer: Medicare Other | Admitting: Family Medicine

## 2013-01-17 ENCOUNTER — Encounter: Payer: Self-pay | Admitting: Family Medicine

## 2013-01-17 VITALS — BP 124/71 | HR 65 | Temp 98.5°F | Ht 61.0 in | Wt 159.0 lb

## 2013-01-17 DIAGNOSIS — E119 Type 2 diabetes mellitus without complications: Secondary | ICD-10-CM

## 2013-01-17 DIAGNOSIS — Z113 Encounter for screening for infections with a predominantly sexual mode of transmission: Secondary | ICD-10-CM | POA: Diagnosis not present

## 2013-01-17 DIAGNOSIS — N898 Other specified noninflammatory disorders of vagina: Secondary | ICD-10-CM

## 2013-01-17 DIAGNOSIS — N760 Acute vaginitis: Secondary | ICD-10-CM | POA: Diagnosis not present

## 2013-01-17 LAB — POCT WET PREP (WET MOUNT): WBC, Wet Prep HPF POC: >20

## 2013-01-17 LAB — POCT GLYCOSYLATED HEMOGLOBIN (HGB A1C): Hemoglobin A1C: 9

## 2013-01-17 MED ORDER — PROMETHAZINE HCL 25 MG PO TABS: 25.0000 mg | ORAL_TABLET | ORAL | Status: DC | PRN

## 2013-01-17 MED ORDER — LORAZEPAM 1 MG PO TABS: 1.0000 mg | ORAL_TABLET | Freq: Two times a day (BID) | ORAL | Status: DC | PRN

## 2013-01-17 MED ORDER — OXYCODONE HCL 10 MG PO TABS: 10.0000 mg | ORAL_TABLET | Freq: Three times a day (TID) | ORAL | Status: DC | PRN

## 2013-01-17 MED ORDER — FLUCONAZOLE 150 MG PO TABS: 150.0000 mg | ORAL_TABLET | Freq: Once | ORAL | Status: DC

## 2013-01-17 MED ORDER — "INSULIN SYRINGE-NEEDLE U-100 31G X 5/16"" 0.3 ML MISC": Status: DC

## 2013-01-17 NOTE — Assessment & Plan Note (Signed)
No trich, which patient was concerned about since she had it last time. But yeast. Rx for diflucan. We will go over GC/Chlamydia results next week when she follows-up.

## 2013-01-17 NOTE — Patient Instructions (Addendum)
Follow-up on Monday to go over lab results and discuss your diabetes

## 2013-01-17 NOTE — Progress Notes (Signed)
  Subjective:    Patient ID: [name] [name], female    DOB: [birth year], 47 y.o.   MRN: [id number]  HPI # She is worried she has an STD She had intercourse with her partner of 10 years last time in 12/31/2012, and a few days ago she started experiencing tingling/irritation in her vaginal area without a significant amount of discharge.  ROS: denies fevers, chills, abdominal pain  Review of Systems Denies vomiting  Allergies, medication, past medical history reviewed.  Smoking status noted. -Depression, anxiety  -Cyclical vomiting syndrome -Migraine -Marijuana use, tobaccco -T2DM, nephropathy. HgbA1c 10.9 07/2012.  -HTN -Chronic back pain -GERD    Objective:   Physical Exam Gen: NAD ABD: soft, NT, ND GU:    Vagina: vaginal wall tenderness; thick white discharge   Cervix: no cervical motion tenderness    Assessment & Plan:

## 2013-01-18 ENCOUNTER — Other Ambulatory Visit: Payer: Self-pay | Admitting: *Deleted

## 2013-01-18 MED ORDER — FLUCONAZOLE 150 MG PO TABS: 150.0000 mg | ORAL_TABLET | Freq: Once | ORAL | Status: DC

## 2013-01-22 ENCOUNTER — Ambulatory Visit: Payer: Medicare Other | Admitting: Family Medicine

## 2013-01-24 ENCOUNTER — Telehealth: Payer: Self-pay | Admitting: Family Medicine

## 2013-01-24 NOTE — Telephone Encounter (Signed)
Patient is calling for lab results.

## 2013-01-25 NOTE — Telephone Encounter (Signed)
Please inform patient negative GC/Chlamydia.  She missed her follow-up appointment to discuss her diabetes and go over her lab work. Please encourage her to make follow-up appointment regarding her diabetes.   Thank you.

## 2013-01-25 NOTE — Telephone Encounter (Signed)
Left message both on cell phone and home phone to rtn call. [name] [name], [name] [name]

## 2013-01-25 NOTE — Telephone Encounter (Signed)
Gave pt message and she has appt for 2/11

## 2013-02-06 ENCOUNTER — Ambulatory Visit: Payer: Medicare Other | Admitting: Family Medicine

## 2013-02-09 ENCOUNTER — Encounter (HOSPITAL_COMMUNITY): Payer: Self-pay | Admitting: Emergency Medicine

## 2013-02-09 ENCOUNTER — Emergency Department (HOSPITAL_COMMUNITY)
Admission: EM | Admit: 2013-02-09 | Discharge: 2013-02-09 | Disposition: A | Discharge status: Home / Self Care | Payer: Medicare Other | Source: Home / Self Care | Attending: Emergency Medicine | Admitting: Emergency Medicine

## 2013-02-09 ENCOUNTER — Emergency Department (HOSPITAL_COMMUNITY): Payer: Medicare Other

## 2013-02-09 ENCOUNTER — Telehealth: Payer: Self-pay | Admitting: Sports Medicine

## 2013-02-09 DIAGNOSIS — E119 Type 2 diabetes mellitus without complications: Secondary | ICD-10-CM | POA: Diagnosis not present

## 2013-02-09 DIAGNOSIS — K2990 Gastroduodenitis, unspecified, without bleeding: Secondary | ICD-10-CM | POA: Diagnosis not present

## 2013-02-09 DIAGNOSIS — R112 Nausea with vomiting, unspecified: Secondary | ICD-10-CM | POA: Diagnosis not present

## 2013-02-09 DIAGNOSIS — R109 Unspecified abdominal pain: Secondary | ICD-10-CM | POA: Diagnosis not present

## 2013-02-09 DIAGNOSIS — R1110 Vomiting, unspecified: Secondary | ICD-10-CM | POA: Diagnosis not present

## 2013-02-09 DIAGNOSIS — R1115 Cyclical vomiting syndrome unrelated to migraine: Secondary | ICD-10-CM

## 2013-02-09 DIAGNOSIS — R739 Hyperglycemia, unspecified: Secondary | ICD-10-CM

## 2013-02-09 DIAGNOSIS — R1013 Epigastric pain: Secondary | ICD-10-CM | POA: Diagnosis not present

## 2013-02-09 DIAGNOSIS — R10819 Abdominal tenderness, unspecified site: Secondary | ICD-10-CM | POA: Diagnosis not present

## 2013-02-09 LAB — COMPREHENSIVE METABOLIC PANEL
ALT: 13 U/L (ref 0–35)
Albumin: 4.5 g/dL (ref 3.5–5.2)
Alkaline Phosphatase: 100 U/L (ref 39–117)
Calcium: 10.4 mg/dL (ref 8.4–10.5)
GFR calc Af Amer: 35 mL/min — ABNORMAL LOW (ref >90)
Potassium: 3.9 mEq/L (ref 3.5–5.1)
Sodium: 140 mEq/L (ref 135–145)
Total Protein: 8.3 g/dL (ref 6.0–8.3)

## 2013-02-09 LAB — URINALYSIS, MICROSCOPIC ONLY
Glucose, UA: 500 mg/dL — AB
Leukocytes, UA: NEGATIVE
Protein, ur: 100 mg/dL — AB
Specific Gravity, Urine: 1.021 (ref 1.005–1.030)
Urobilinogen, UA: 1 mg/dL (ref 0.0–1.0)

## 2013-02-09 LAB — LIPASE, BLOOD: Lipase: 40 U/L (ref 11–59)

## 2013-02-09 LAB — CBC WITH DIFFERENTIAL/PLATELET
Eosinophils Absolute: 0.1 10*3/uL (ref 0.0–0.7)
Hemoglobin: 13.2 g/dL (ref 12.0–15.0)
Lymphs Abs: 2.6 10*3/uL (ref 0.7–4.0)
MCH: 32 pg (ref 26.0–34.0)
Monocytes Relative: 8 % (ref 3–12)
Neutro Abs: 4.4 10*3/uL (ref 1.7–7.7)
Neutrophils Relative %: 57 % (ref 43–77)
Platelets: 280 10*3/uL (ref 150–400)
RBC: 4.13 MIL/uL (ref 3.87–5.11)
WBC: 7.6 10*3/uL (ref 4.0–10.5)

## 2013-02-09 LAB — GLUCOSE, CAPILLARY: Glucose-Capillary: 295 mg/dL — ABNORMAL HIGH (ref 70–99)

## 2013-02-09 MED ORDER — OXYCODONE HCL 5 MG PO TABS: 5.0000 mg | ORAL_TABLET | ORAL | Status: DC | PRN

## 2013-02-09 MED ORDER — ONDANSETRON HCL 4 MG/2ML IJ SOLN
4.0000 mg | Freq: Once | INTRAMUSCULAR | Status: AC
  Administered 2013-02-09: 4 mg via INTRAVENOUS
  Filled 2013-02-09: qty 2

## 2013-02-09 MED ORDER — PROMETHAZINE HCL 25 MG PO TABS: 25.0000 mg | ORAL_TABLET | Freq: Four times a day (QID) | ORAL | Status: DC | PRN

## 2013-02-09 MED ORDER — HEPARIN SOD (PORK) LOCK FLUSH 100 UNIT/ML IV SOLN
INTRAVENOUS | Status: AC
  Administered 2013-02-09
  Filled 2013-02-09: qty 5

## 2013-02-09 MED ORDER — PROMETHAZINE HCL 25 MG RE SUPP: 25.0000 mg | Freq: Four times a day (QID) | RECTAL | Status: DC | PRN

## 2013-02-09 MED ORDER — LORAZEPAM 1 MG PO TABS: 1.0000 mg | ORAL_TABLET | Freq: Three times a day (TID) | ORAL | Status: DC | PRN

## 2013-02-09 MED ORDER — SODIUM CHLORIDE 0.9 % IV BOLUS (SEPSIS)
1000.0000 mL | Freq: Once | INTRAVENOUS | Status: AC
  Administered 2013-02-09: 1000 mL via INTRAVENOUS

## 2013-02-09 MED ORDER — LORAZEPAM 2 MG/ML IJ SOLN
1.0000 mg | Freq: Once | INTRAMUSCULAR | Status: AC
  Administered 2013-02-09: 1 mg via INTRAVENOUS
  Filled 2013-02-09: qty 1

## 2013-02-09 MED ORDER — HYDROMORPHONE HCL PF 1 MG/ML IJ SOLN
1.0000 mg | Freq: Once | INTRAMUSCULAR | Status: AC
  Administered 2013-02-09: 1 mg via INTRAVENOUS
  Filled 2013-02-09: qty 1

## 2013-02-09 MED ORDER — INSULIN ASPART 100 UNIT/ML ~~LOC~~ SOLN
10.0000 [IU] | Freq: Once | SUBCUTANEOUS | Status: AC
  Administered 2013-02-09: 10 [IU] via SUBCUTANEOUS
  Filled 2013-02-09: qty 1

## 2013-02-09 MED ORDER — ONDANSETRON HCL 4 MG PO TABS: 4.0000 mg | ORAL_TABLET | Freq: Three times a day (TID) | ORAL | Status: DC | PRN

## 2013-02-09 MED ORDER — INSULIN REGULAR HUMAN 100 UNIT/ML IJ SOLN: 10.0000 [IU] | Freq: Once | INTRAMUSCULAR | Status: DC

## 2013-02-09 NOTE — ED Notes (Signed)
Patient back from  X-ray 

## 2013-02-09 NOTE — Telephone Encounter (Signed)
See Prob oriented note.  Pt likely to improve with Ativan and Dilaudid + Zofran & Phenergan. Consider d/c from ED with above Rx

## 2013-02-09 NOTE — ED Provider Notes (Signed)
History     CSN: [id number]  Arrival date & time 02/09/13  6   First MD Initiated Contact with Patient 02/09/13 1844      Chief Complaint  Patient presents with  . Emesis    (Consider location/radiation/quality/duration/timing/severity/associated sxs/prior treatment) HPI Pt with hx of pancreatits, diabetes and cyclic vomiting syndrome presents with multiple episodes of emesis today- nonbloody and nonbilious.  No fever/chills.  Pt c/o epigastric abdominal pain.  Sharp and constant.  States todays flare is similar to prior flares of cyclic vomiting.  States she could not take her home nausea meds because nothing would stay down.  Denies dysuria  Past Medical History  Diagnosis Date  . PANCREATITIS 05/09/2008  . NEUROPATHY, DIABETIC 02/23/2007  . Cyclic vomiting syndrome   . Hypertension   . Complication of anesthesia     "problems waking up"  . Heart murmur   . Myocardial infarction 07/2007    "they say I've had a silent one; I don't know"  . Pneumonia   . Type II diabetes mellitus   . Anemia   . Blood transfusion   . GERD (gastroesophageal reflux disease)   . Anxiety   . Depression   . Diabetic gastroparesis     /e-chart    Past Surgical History  Procedure Laterality Date  . Port-a-cath placement  ~ 2008    right chest; "poor access; frequent hospitalizations"  . Laparotomy and lysis of adhesions    . Abdominal hysterectomy  02/2002  . Cholecystectomy  1980's    Family History  Problem Relation Age of Onset  . Diabetes type II Mother   . Diabetes Mother   . Hypertension Mother   . Diabetes type II Sister   . Diabetes Sister   . Hypertension Sister   . Hypertension Brother     History  Substance Use Topics  . Smoking status: Current Some Day Smoker -- 0.50 packs/day for 22 years    Types: Cigarettes    Last Attempt to Quit: 02/22/2012  . Smokeless tobacco: Never Used  . Alcohol Use: No    OB History   Grav Para Term Preterm Abortions TAB SAB Ect Mult  Living                  Review of Systems ROS reviewed and all otherwise negative except for mentioned in HPI  Allergies  Erythromycin; Acetaminophen; and Oxycodone-acetaminophen  Home Medications   Current Outpatient Rx  Name  Route  Sig  Dispense  Refill  . aspirin EC 81 MG tablet   Oral   Take 81 mg by mouth daily.         . carvedilol (COREG) 25 MG tablet   Oral   Take 1 tablet (25 mg total) by mouth 2 (two) times daily with a meal.   30 tablet   11   . cyclobenzaprine (FLEXERIL) 10 MG tablet   Oral   Take 1 tablet (10 mg total) by mouth 3 (three) times daily as needed for muscle spasms.   30 tablet   0   . insulin aspart (NOVOLOG) 100 UNIT/ML injection   Subcutaneous   Inject 4-8 Units into the skin 3 (three) times daily before meals. Sliding scale         . insulin glargine (LANTUS) 100 UNIT/ML injection   Subcutaneous   Inject 40 Units into the skin daily.   10 mL   2   . lisinopril (PRINIVIL,ZESTRIL) 40 MG tablet   Oral  Take 1 tablet (40 mg total) by mouth daily.   30 tablet   11   . Loratadine 10 MG CAPS   Oral   Take 1 capsule (10 mg total) by mouth daily.   30 each   5   . LORazepam (ATIVAN) 1 MG tablet   Oral   Take 1 tablet (1 mg total) by mouth 2 (two) times daily as needed for anxiety (nausea). Fill 12/23   60 tablet   0   . omeprazole (PRILOSEC) 20 MG capsule   Oral   Take 20 mg by mouth daily as needed. For acid reflux         . Oxycodone HCl 10 MG TABS   Oral   Take 1 tablet (10 mg total) by mouth 3 (three) times daily as needed. For pain.   90 tablet   0     Fill 12/23   . senna (SENOKOT) 8.6 MG TABS   Oral   Take 2 tablets (17.2 mg total) by mouth 2 (two) times daily. For constipation   60 each   3   . sucralfate (CARAFATE) 1 G tablet      Take 1 tablet (1 g total) by mouth 4 (four) times daily with meals & at bedtime   120 tablet   2     Refill [id number]   . venlafaxine (EFFEXOR) 37.5 MG tablet      Take  1 tablet once daily for a week. Then increase to 1 tablet twice daily. For hot flashes.   50 tablet   0   . diclofenac sodium (VOLTAREN) 1 % GEL   Topical   Apply 1 application topically 4 (four) times daily.   100 g   3   . EPINEPHrine (EPIPEN) 0.3 MG/0.3ML DEVI   Intramuscular   Inject 0.3 mg into the muscle once. as needed for severe anaphylaxis.         . Insulin Syringe-Needle U-100 (PRODIGY INSULIN SYRINGE) 31G X 5/16" 0.3 ML MISC      For Lantus   100 each   6   . LORazepam (ATIVAN) 1 MG tablet   Oral   Take 1 tablet (1 mg total) by mouth 3 (three) times daily as needed for anxiety.   6 tablet   0   . ondansetron (ZOFRAN) 4 MG tablet   Oral   Take 1 tablet (4 mg total) by mouth every 8 (eight) hours as needed for nausea.   20 tablet   0   . oxyCODONE (ROXICODONE) 5 MG immediate release tablet   Oral   Take 1 tablet (5 mg total) by mouth every 4 (four) hours as needed for pain.   30 tablet   0   . promethazine (PHENERGAN) 25 MG suppository   Rectal   Place 1 suppository (25 mg total) rectally every 6 (six) hours as needed for nausea.   12 each   0   . promethazine (PHENERGAN) 25 MG tablet   Oral   Take 1 tablet (25 mg total) by mouth every 4 (four) hours as needed for nausea.   30 tablet   3     Refill [id number]   . promethazine (PHENERGAN) 25 MG tablet   Oral   Take 1 tablet (25 mg total) by mouth every 6 (six) hours as needed for nausea.   30 tablet   0     BP 184/92  Pulse 97  Temp(Src) 98.1 F (36.7 C) (Oral)  Resp 18  SpO2 100% Vitals reviewed Physical Exam Physical Examination: General appearance - alert, well appearing, and in no distress Mental status - alert, oriented to person, place, and time Eyes - no scleral icterus, no conjunctival injection Mouth - mucous membranes moist, pharynx normal without lesions Chest - clear to auscultation, no wheezes, rales or rhonchi, symmetric air entry Heart - normal rate, regular rhythm,  normal S1, S2, no murmurs, rubs, clicks or gallops Abdomen - soft, epigastric ttp- mild, no gaurding or rebound, nondistended, no masses or organomegaly Extremities - peripheral pulses normal, no pedal edema, no clubbing or cyanosis Skin - normal coloration and turgor, no rashes  ED Course  Procedures (including critical care time)  11:10 PM pt rechecked multiple times and she is calm and comfortable, sleeping.  Has tolerated gingerale in ED.  Blood sugar improved after fluids and insulin.    Labs Reviewed  URINALYSIS, MICROSCOPIC ONLY - Abnormal; Notable for the following:    APPearance CLOUDY (*)    Glucose, UA 500 (*)    Hgb urine dipstick TRACE (*)    Ketones, ur TRACE (*)    Protein, ur 100 (*)    Bacteria, UA FEW (*)    Squamous Epithelial / LPF FEW (*)    All other components within normal limits  COMPREHENSIVE METABOLIC PANEL - Abnormal; Notable for the following:    Glucose, Bld 358 (*)    BUN 30 (*)    Creatinine, Ser 1.88 (*)    GFR calc non Af Amer 31 (*)    GFR calc Af Amer 35 (*)    All other components within normal limits  GLUCOSE, CAPILLARY - Abnormal; Notable for the following:    Glucose-Capillary 295 (*)    All other components within normal limits  GLUCOSE, CAPILLARY - Abnormal; Notable for the following:    Glucose-Capillary 270 (*)    All other components within normal limits  CBC WITH DIFFERENTIAL  LIPASE, BLOOD  PREGNANCY, URINE   Dg Abd Acute W/chest  02/09/2013  *RADIOLOGY REPORT*  Clinical Data: Vomiting and abdominal pain  ACUTE ABDOMEN SERIES (ABDOMEN 2 VIEW & CHEST 1 VIEW)  Comparison: 01/20/2012  Findings: Heart size is normal.  No pleural effusion or edema.  No airspace consolidation identified.  There is a right chest wall porta-catheter with tip in the SVC.  Cholecystectomy clips are present within the right upper quadrant of the abdomen.  No dilated loops of small bowel or fluid levels identified.  Gas and stool noted throughout the colon up  to the rectum.  IMPRESSION:  1.  No acute cardiopulmonary abnormalities. 2.  Nonobstructive bowel gas pattern.   Original Report Authenticated By: Kerby Moors, M.D.      1. Vomiting   2. Hyperglycemia without ketosis       MDM  Pt presenting with c/o vomiting and epigastric pain- this is similar to her prior episodes of cyclic vomiting.  Glucose was also elevated and no evidence of DKA- glucose improved after NS and insulin in ED.  She has tolerated po trial in ED and looks much improved on recheck.  She requests one more dose of zofran and will discharged with prescriptions as advised by her PMD in epic note.  Discharged with strict return precautions.  Pt agreeable with plan.        Threasa Beards, MD 02/10/13 [phone]

## 2013-02-09 NOTE — ED Notes (Signed)
Patient given diet gingerale. Tolerated well.

## 2013-02-09 NOTE — ED Notes (Signed)
Per Ems: The patient reports that  She cyclic vomiting syndrome. The patient started vomiting at 4 am this am. The patient reports that she stopped taking her own meds for this. The patient however did take her phenergan with no assists.

## 2013-02-09 NOTE — ED Notes (Signed)
MD at bedside. 

## 2013-02-09 NOTE — ED Notes (Signed)
Patient requesting pain and nausea medication. States pain to be 7 1/2-8. Patient barely able to keep eyes open or carry out conversation. Faces pain scale 1-2/10. Patient tolerated gingerale previously given with no difficulties, and requested additional drinks.

## 2013-02-09 NOTE — Assessment & Plan Note (Signed)
Pt calling to say that she started vomiting earlier today and cannot keep anything down at this time.  Cannot make it to a pharmacy to pick up PR, or disolving tablets.  Unsure as to what to do.   Instructed to call for EMS transport and evaluation/stablization in ED.  If improved could consider d/c home with short supply of meds until able to obtain supplies from pharmacy.

## 2013-02-11 ENCOUNTER — Encounter (HOSPITAL_COMMUNITY): Payer: Self-pay | Admitting: Family Medicine

## 2013-02-11 ENCOUNTER — Emergency Department (HOSPITAL_COMMUNITY): Payer: Medicare Other

## 2013-02-11 ENCOUNTER — Inpatient Hospital Stay (HOSPITAL_COMMUNITY)
Admission: EM | Admit: 2013-02-11 | Discharge: 2013-02-13 | DRG: 392 | Disposition: A | Discharge status: Home / Self Care | Payer: Medicare Other | Attending: Family Medicine | Admitting: Family Medicine

## 2013-02-11 DIAGNOSIS — N289 Disorder of kidney and ureter, unspecified: Secondary | ICD-10-CM | POA: Diagnosis not present

## 2013-02-11 DIAGNOSIS — N179 Acute kidney failure, unspecified: Secondary | ICD-10-CM | POA: Diagnosis present

## 2013-02-11 DIAGNOSIS — K859 Acute pancreatitis without necrosis or infection, unspecified: Secondary | ICD-10-CM

## 2013-02-11 DIAGNOSIS — D72829 Elevated white blood cell count, unspecified: Secondary | ICD-10-CM | POA: Diagnosis present

## 2013-02-11 DIAGNOSIS — E1322 Other specified diabetes mellitus with diabetic chronic kidney disease: Secondary | ICD-10-CM | POA: Diagnosis present

## 2013-02-11 DIAGNOSIS — I252 Old myocardial infarction: Secondary | ICD-10-CM

## 2013-02-11 DIAGNOSIS — F17200 Nicotine dependence, unspecified, uncomplicated: Secondary | ICD-10-CM | POA: Diagnosis present

## 2013-02-11 DIAGNOSIS — Z8701 Personal history of pneumonia (recurrent): Secondary | ICD-10-CM

## 2013-02-11 DIAGNOSIS — K3184 Gastroparesis: Secondary | ICD-10-CM | POA: Diagnosis present

## 2013-02-11 DIAGNOSIS — F1210 Cannabis abuse, uncomplicated: Secondary | ICD-10-CM | POA: Diagnosis present

## 2013-02-11 DIAGNOSIS — R112 Nausea with vomiting, unspecified: Secondary | ICD-10-CM | POA: Diagnosis not present

## 2013-02-11 DIAGNOSIS — E1142 Type 2 diabetes mellitus with diabetic polyneuropathy: Secondary | ICD-10-CM | POA: Diagnosis present

## 2013-02-11 DIAGNOSIS — Z79899 Other long term (current) drug therapy: Secondary | ICD-10-CM

## 2013-02-11 DIAGNOSIS — E1149 Type 2 diabetes mellitus with other diabetic neurological complication: Secondary | ICD-10-CM | POA: Diagnosis present

## 2013-02-11 DIAGNOSIS — F339 Major depressive disorder, recurrent, unspecified: Secondary | ICD-10-CM

## 2013-02-11 DIAGNOSIS — Z794 Long term (current) use of insulin: Secondary | ICD-10-CM

## 2013-02-11 DIAGNOSIS — N183 Chronic kidney disease, stage 3 unspecified: Secondary | ICD-10-CM | POA: Diagnosis present

## 2013-02-11 DIAGNOSIS — K224 Dyskinesia of esophagus: Secondary | ICD-10-CM

## 2013-02-11 DIAGNOSIS — K2970 Gastritis, unspecified, without bleeding: Secondary | ICD-10-CM | POA: Diagnosis not present

## 2013-02-11 DIAGNOSIS — F411 Generalized anxiety disorder: Secondary | ICD-10-CM | POA: Diagnosis present

## 2013-02-11 DIAGNOSIS — R748 Abnormal levels of other serum enzymes: Secondary | ICD-10-CM | POA: Diagnosis present

## 2013-02-11 DIAGNOSIS — Z7982 Long term (current) use of aspirin: Secondary | ICD-10-CM

## 2013-02-11 DIAGNOSIS — R1115 Cyclical vomiting syndrome unrelated to migraine: Principal | ICD-10-CM | POA: Diagnosis present

## 2013-02-11 DIAGNOSIS — K219 Gastro-esophageal reflux disease without esophagitis: Secondary | ICD-10-CM | POA: Diagnosis present

## 2013-02-11 DIAGNOSIS — F1290 Cannabis use, unspecified, uncomplicated: Secondary | ICD-10-CM

## 2013-02-11 DIAGNOSIS — F329 Major depressive disorder, single episode, unspecified: Secondary | ICD-10-CM | POA: Diagnosis present

## 2013-02-11 DIAGNOSIS — I129 Hypertensive chronic kidney disease with stage 1 through stage 4 chronic kidney disease, or unspecified chronic kidney disease: Secondary | ICD-10-CM | POA: Diagnosis present

## 2013-02-11 DIAGNOSIS — F3289 Other specified depressive episodes: Secondary | ICD-10-CM | POA: Diagnosis present

## 2013-02-11 LAB — POCT I-STAT 3, VENOUS BLOOD GAS (G3P V)
Acid-Base Excess: 5 mmol/L — ABNORMAL HIGH (ref 0.0–2.0)
Bicarbonate: 32.1 mEq/L — ABNORMAL HIGH (ref 20.0–24.0)
pH, Ven: 7.378 — ABNORMAL HIGH (ref 7.250–7.300)
pO2, Ven: 31 mmHg (ref 30.0–45.0)

## 2013-02-11 LAB — CBC
HCT: 39.3 % (ref 36.0–46.0)
Hemoglobin: 15 g/dL (ref 12.0–15.0)
MCH: 31.4 pg (ref 26.0–34.0)
MCH: 31.3 pg (ref 26.0–34.0)
MCV: 88.3 fL (ref 78.0–100.0)
MCV: 89.7 fL (ref 78.0–100.0)
Platelets: 214 10*3/uL (ref 150–400)
Platelets: 173 10*3/uL (ref 150–400)
RBC: 4.77 MIL/uL (ref 3.87–5.11)
RDW: 13.1 % (ref 11.5–15.5)
WBC: 14.1 10*3/uL — ABNORMAL HIGH (ref 4.0–10.5)
WBC: 11.9 10*3/uL — ABNORMAL HIGH (ref 4.0–10.5)

## 2013-02-11 LAB — COMPREHENSIVE METABOLIC PANEL
ALT: 14 U/L (ref 0–35)
AST: 19 U/L (ref 0–37)
CO2: 29 mEq/L (ref 19–32)
Calcium: 9.7 mg/dL (ref 8.4–10.5)
Chloride: 82 mEq/L — ABNORMAL LOW (ref 96–112)
GFR calc Af Amer: 29 mL/min — ABNORMAL LOW (ref >90)
GFR calc non Af Amer: 25 mL/min — ABNORMAL LOW (ref >90)
Glucose, Bld: 386 mg/dL — ABNORMAL HIGH (ref 70–99)
Sodium: 128 mEq/L — ABNORMAL LOW (ref 135–145)
Total Bilirubin: 0.8 mg/dL (ref 0.3–1.2)

## 2013-02-11 LAB — GLUCOSE, CAPILLARY: Glucose-Capillary: 261 mg/dL — ABNORMAL HIGH (ref 70–99)

## 2013-02-11 LAB — CREATININE, SERUM: GFR calc Af Amer: 34 mL/min — ABNORMAL LOW (ref >90)

## 2013-02-11 LAB — TROPONIN I: Troponin I: <0.3 ng/mL (ref <0.30)

## 2013-02-11 LAB — MAGNESIUM: Magnesium: 1.9 mg/dL (ref 1.5–2.5)

## 2013-02-11 MED ORDER — ONDANSETRON HCL 4 MG/2ML IJ SOLN
4.0000 mg | Freq: Once | INTRAMUSCULAR | Status: AC
  Administered 2013-02-11: 4 mg via INTRAVENOUS
  Filled 2013-02-11: qty 2

## 2013-02-11 MED ORDER — NIFEDIPINE 10 MG PO CAPS
10.0000 mg | ORAL_CAPSULE | Freq: Three times a day (TID) | ORAL | Status: DC
  Administered 2013-02-11 – 2013-02-13 (×5): 10 mg via ORAL
  Filled 2013-02-11 (×9): qty 1

## 2013-02-11 MED ORDER — LORAZEPAM 2 MG/ML IJ SOLN
1.0000 mg | Freq: Four times a day (QID) | INTRAMUSCULAR | Status: DC | PRN
  Administered 2013-02-11: 1 mg via INTRAVENOUS
  Filled 2013-02-11: qty 1

## 2013-02-11 MED ORDER — HYDROMORPHONE HCL PF 1 MG/ML IJ SOLN
1.0000 mg | Freq: Once | INTRAMUSCULAR | Status: AC
  Administered 2013-02-11: 1 mg via INTRAVENOUS
  Filled 2013-02-11: qty 1

## 2013-02-11 MED ORDER — INSULIN ASPART 100 UNIT/ML ~~LOC~~ SOLN: 10.0000 [IU] | Freq: Once | SUBCUTANEOUS | Status: DC

## 2013-02-11 MED ORDER — PROMETHAZINE HCL 25 MG/ML IJ SOLN
12.5000 mg | Freq: Four times a day (QID) | INTRAMUSCULAR | Status: DC | PRN
  Administered 2013-02-12: 12.5 mg via INTRAVENOUS
  Filled 2013-02-11 (×2): qty 1

## 2013-02-11 MED ORDER — PANTOPRAZOLE SODIUM 40 MG IV SOLR
40.0000 mg | Freq: Two times a day (BID) | INTRAVENOUS | Status: DC
  Administered 2013-02-11: 40 mg via INTRAVENOUS
  Filled 2013-02-11 (×3): qty 40

## 2013-02-11 MED ORDER — CARVEDILOL 25 MG PO TABS
25.0000 mg | ORAL_TABLET | Freq: Two times a day (BID) | ORAL | Status: DC
  Administered 2013-02-11 – 2013-02-13 (×4): 25 mg via ORAL
  Filled 2013-02-11 (×7): qty 1

## 2013-02-11 MED ORDER — GLUCOSE 40 % PO GEL: 1.0000 | ORAL | Status: DC | PRN

## 2013-02-11 MED ORDER — SODIUM CHLORIDE 0.9 % IJ SOLN: 3.0000 mL | Freq: Two times a day (BID) | INTRAMUSCULAR | Status: DC

## 2013-02-11 MED ORDER — GLUCOSE 40 % PO GEL
ORAL | Status: AC
  Administered 2013-02-11: 37.5 g
  Filled 2013-02-11: qty 1

## 2013-02-11 MED ORDER — SODIUM CHLORIDE 0.9 % IV SOLN
1000.0000 mL | INTRAVENOUS | Status: DC
  Administered 2013-02-11 (×5): 1000 mL via INTRAVENOUS

## 2013-02-11 MED ORDER — SODIUM CHLORIDE 0.9 % IV SOLN
1000.0000 mL | Freq: Once | INTRAVENOUS | Status: AC
  Administered 2013-02-11: 1000 mL via INTRAVENOUS

## 2013-02-11 MED ORDER — HEPARIN SODIUM (PORCINE) 5000 UNIT/ML IJ SOLN
5000.0000 [IU] | Freq: Three times a day (TID) | INTRAMUSCULAR | Status: DC
  Administered 2013-02-11 – 2013-02-13 (×5): 5000 [IU] via SUBCUTANEOUS
  Filled 2013-02-11 (×8): qty 1

## 2013-02-11 MED ORDER — ASPIRIN EC 81 MG PO TBEC
81.0000 mg | DELAYED_RELEASE_TABLET | Freq: Every day | ORAL | Status: DC
  Administered 2013-02-11 – 2013-02-13 (×3): 81 mg via ORAL
  Filled 2013-02-11 (×3): qty 1

## 2013-02-11 MED ORDER — INSULIN ASPART 100 UNIT/ML ~~LOC~~ SOLN
0.0000 [IU] | SUBCUTANEOUS | Status: DC
  Administered 2013-02-11: 8 [IU] via SUBCUTANEOUS
  Administered 2013-02-12: 3 [IU] via SUBCUTANEOUS
  Administered 2013-02-12: 2 [IU] via SUBCUTANEOUS
  Administered 2013-02-13: 3 [IU] via SUBCUTANEOUS

## 2013-02-11 MED ORDER — HYDROMORPHONE HCL PF 1 MG/ML IJ SOLN
1.0000 mg | INTRAMUSCULAR | Status: DC | PRN
  Administered 2013-02-11 – 2013-02-12 (×5): 1 mg via INTRAVENOUS
  Filled 2013-02-11 (×5): qty 1

## 2013-02-11 MED ORDER — ONDANSETRON HCL 4 MG/2ML IJ SOLN
4.0000 mg | Freq: Four times a day (QID) | INTRAMUSCULAR | Status: DC
  Administered 2013-02-11 – 2013-02-12 (×3): 4 mg via INTRAVENOUS
  Filled 2013-02-11 (×3): qty 2

## 2013-02-11 MED ORDER — INSULIN GLARGINE 100 UNIT/ML ~~LOC~~ SOLN
20.0000 [IU] | Freq: Every day | SUBCUTANEOUS | Status: DC
  Administered 2013-02-11 – 2013-02-12 (×2): 20 [IU] via SUBCUTANEOUS
  Administered 2013-02-13: 16 [IU] via SUBCUTANEOUS

## 2013-02-11 MED ORDER — GI COCKTAIL ~~LOC~~
30.0000 mL | Freq: Three times a day (TID) | ORAL | Status: DC | PRN
  Administered 2013-02-11 – 2013-02-13 (×6): 30 mL via ORAL
  Filled 2013-02-11 (×7): qty 30

## 2013-02-11 NOTE — H&P (Signed)
Family Medicine Teaching Service Admission H&P Service Pager: [phone]  Patient name: [name] [name] Medical record number: [id number] Date of birth: [birth year] Age: 47 y.o. Gender: female  Primary Care Provider: [place] PARK, [name], MD Consultants: None  CODE STATUS: Full  CC  Nausea and vomiting  HPI  [name] [name] is a 47 y.o. year old female presenting with 2 day history of Nausea and Vomiting.  She has a history significant for cyclic vomiting syndrome.  She reports 2 days ago she began developing her typical N&V but was unable to get her home medications refilled due to the weather and inability to travel.  She was seen in the ED on 2/14 due to these symptoms given pain medicine & phenergan and discharged to home.  She has had continued N&V with some blood streaked vomitus.  Severe episodic abdominal pain that is slightly different than her normal pain.  Some epigastric discomfort that she reports as a burning that is more constant.    Of note  Pt does have prior association with smoking marijuana and having cyclic vomiting.  She reports last having smoked the day her symptoms started.     ROS   Constitutional Worsening N&V.  Anorexia.  No sig weight loss  Infectious No fevers, no chills  Resp No cough or congestion  Cardiac No overt chest pressure, no diaphoresis, no exertional component.  No palpitations  GI N&V as above.  No constipation/diarrhea  GU No dysuria    HISTORY  PMHx:  Past Medical History  Diagnosis Date  . PANCREATITIS 05/09/2008  . NEUROPATHY, DIABETIC 02/23/2007  . Cyclic vomiting syndrome   . Hypertension   . Complication of anesthesia     "problems waking up"  . Heart murmur   . Myocardial infarction 07/2007    "they say I've had a silent one; I don't know"  . Pneumonia   . Type II diabetes mellitus   . Anemia   . Blood transfusion   . GERD (gastroesophageal reflux disease)   . Anxiety   . Depression   . Diabetic gastroparesis     /e-chart     PSHx: Past Surgical History  Procedure Laterality Date  . Port-a-cath placement  ~ 2008    right chest; "poor access; frequent hospitalizations"  . Laparotomy and lysis of adhesions    . Abdominal hysterectomy  02/2002  . Cholecystectomy  1980's  . Cardiac catheterization    . Dilation and evacuation  X3  . Left hand surgery    . Cataract extraction w/ intraocular lens  implant, bilateral    . Colonoscopy    . Upper gi endoscopy      Social Hx: History   Social History  . Marital Status: Single    Spouse Name: N/A    Number of Children: N/A  . Years of Education: N/A   Social History Main Topics  . Smoking status: Current Some Day Smoker -- 0.50 packs/day for 22 years    Types: Cigarettes    Last Attempt to Quit: 02/22/2012  . Smokeless tobacco: Never Used  . Alcohol Use: No  . Drug Use: Yes    Special: Marijuana     Comment: Current Daily to weekly use  . Sexually Active: Yes    Birth Control/ Protection: None   Other Topics Concern  . None   Social History Narrative   Lives in [place] - some minimal family support   Has 10 grandchildren   Cares for her Dog  [place] (Mixed female [place])     Family Hx: Family History  Problem Relation Age of Onset  . Diabetes type II Mother   . Diabetes Mother   . Hypertension Mother   . Diabetes type II Sister   . Diabetes Sister   . Hypertension Sister   . Hypertension Brother     Allergies: Allergies  Allergen Reactions  . Erythromycin Nausea And Vomiting  . Acetaminophen Nausea And Vomiting  . Oxycodone-Acetaminophen Nausea Only    Only intolerant to APAP component     Home Medications: Prior to Admission medications   Medication Sig Start Date End Date Taking? Authorizing Provider  aspirin EC 81 MG tablet Take 81 mg by mouth daily.   Yes Historical Provider, MD  carvedilol (COREG) 25 MG tablet Take 1 tablet (25 mg total) by mouth 2 (two) times daily with a meal. 08/17/12 08/17/13 Yes  Carolin Guernsey, MD  cyclobenzaprine (FLEXERIL) 10 MG tablet Take 1 tablet (10 mg total) by mouth 3 (three) times daily as needed for muscle spasms. 10/18/12  Yes Carolin Guernsey, MD  diclofenac sodium (VOLTAREN) 1 % GEL Apply 1 application topically 4 (four) times daily. 03/17/12  Yes Judithann Sheen, MD  insulin aspart (NOVOLOG) 100 UNIT/ML injection Inject 4-8 Units into the skin 3 (three) times daily before meals. Sliding scale   Yes Historical Provider, MD  insulin glargine (LANTUS) 100 UNIT/ML injection Inject 40 Units into the skin daily. 11/07/12  Yes Carolin Guernsey, MD  lisinopril (PRINIVIL,ZESTRIL) 40 MG tablet Take 1 tablet (40 mg total) by mouth daily. 10/18/12  Yes Carolin Guernsey, MD  Loratadine 10 MG CAPS Take 1 capsule (10 mg total) by mouth daily. 05/04/12  Yes Cletus Gash, MD  LORazepam (ATIVAN) 1 MG tablet Take 1 tablet (1 mg total) by mouth 3 (three) times daily as needed for anxiety. 02/09/13  Yes Threasa Beards, MD  omeprazole (PRILOSEC) 20 MG capsule Take 20 mg by mouth daily as needed. For acid reflux 09/11/12  Yes Historical Provider, MD  Oxycodone HCl 10 MG TABS Take 1 tablet (10 mg total) by mouth 3 (three) times daily as needed. For pain. 01/17/13  Yes Carolin Guernsey, MD  promethazine (PHENERGAN) 25 MG tablet Take 1 tablet (25 mg total) by mouth every 6 (six) hours as needed for nausea. 02/09/13  Yes Threasa Beards, MD  senna (SENOKOT) 8.6 MG TABS Take 2 tablets (17.2 mg total) by mouth 2 (two) times daily. For constipation 12/09/11  Yes Waldemar Dickens, MD  sucralfate (CARAFATE) 1 G tablet Take 1 tablet (1 g total) by mouth 4 (four) times daily with meals & at bedtime 01/16/13  Yes Carolin Guernsey, MD  venlafaxine [facility]) 37.5 MG tablet Take 75 mg by mouth daily as needed (takes for hot flashes). . For hot flashes. 12/15/12  Yes Carolin Guernsey, MD  EPINEPHrine [facility]) 0.3 MG/0.3ML DEVI Inject 0.3 mg into the muscle once. as needed for severe anaphylaxis. (bee  stings)    Historical Provider, MD  Insulin Syringe-Needle U-100 (PRODIGY INSULIN SYRINGE) 31G X 5/16" 0.3 ML MISC For Lantus 01/17/13   Carolin Guernsey, MD  ondansetron (ZOFRAN) 4 MG tablet Take 1 tablet (4 mg total) by mouth every 8 (eight) hours as needed for nausea. 02/09/13   Threasa Beards, MD  oxyCODONE (ROXICODONE) 5 MG immediate release tablet Take 1 tablet (5 mg total) by mouth  every 4 (four) hours as needed for pain. 02/09/13   Threasa Beards, MD     OBJECTIVE  Vitals: Temp:  [98.4 F (36.9 C)] 98.4 F (36.9 C) (02/16 0440) Pulse Rate:  [86-115] 86 (02/16 1330) Resp:  [13-28] 19 (02/16 1330) BP: (142-198)/(75-109) 170/91 mmHg (02/16 1330) SpO2:  [98 %-100 %] 100 % (02/16 1330)  Weight: Wt Readings from Last 3 Encounters:  01/17/13 159 lb (72.122 kg)  12/15/12 174 lb 4 oz (79.039 kg)  11/07/12 166 lb (75.297 kg)    I&Os: Yesterday:   This shift: Total I/O In: 2000 [I.V.:2000] Out: -    PE:  GENERAL:  Adult AA  female. In mild discomfort; no respiratory distress. PSYCH: Alert and appropriately interactive; Insight:Fair   H&N: AT/, trachea midline EENT:  MMM, no scleral icterus, EOMi HEART: RRR, S1/S2 heard, no murmur LUNGS: CTA B, no wheezes, no crackles Abdomen: +BS, soft, mildly tender, no rebound.  No flank bruising or flank tenderness EXTREMITIES: Moves all 4 extremities spontaneously, warm well perfused, no edema, bilateral DP and PT pulses 2/4.     LABS: CBC BMET   Recent Labs Lab 02/09/13 1823 02/11/13 0522  WBC 7.6 14.1*  HGB 13.2 15.0  HCT 37.3 42.1  PLT 280 214     02/11/2013 05:22  Lipase 215 (H)     02/11/2013 08:04  pH, Ven 7.378 (H)  pCO2, Ven 54.6 (H)  pO2, Ven 31.0  Bicarbonate 32.1 (H)  TCO2 34  Acid-Base Excess 5.0 (H)  O2 Saturation 57.0    Recent Labs Lab 02/09/13 1900 02/11/13 0522  NA 140 128*  K 3.9 3.7  CL 100 82*  CO2 22 29  BUN 30* 45*  CREATININE 1.88* 2.23*  CALCIUM 10.4 9.7  GLUCOSE 358* 386*   PROT 8.3 8.4*  ALBUMIN 4.5 4.4    Recent Labs Lab 02/09/13 1900 02/11/13 0522  ALT 13 14  AST 13 19  ALKPHOS 100 108  BILITOT 0.5 0.8     URINE STUDIES: None  MICRO: None  IMAGING: US Abdomen Complete  02/11/2013  *RADIOLOGY REPORT*  Clinical Data:  47 year old female with abdominal pain. History of cholecystectomy.  ABDOMINAL ULTRASOUND COMPLETE  Comparison:  12/05/2011 CT and 10/03/2010 ultrasound  Findings:  Gallbladder:   The gallbladder is not visualized compatible with cholecystectomy.  Common Bile Duct:  There is no evidence of intrahepatic or extrahepatic biliary dilation. The CBD measures 8.0 mm in greatest diameter.  Liver: The hepatic parenchyma is slightly heterogeneous. No focal abnormalities are identified.  IVC:  Appears normal.  Pancreas:  Although the pancreas is difficult to visualize in its entirety, no focal pancreatic abnormality is identified.  Spleen:  Within normal limits in size and echotexture.  Right kidney: Diffusely increased echogenicity of the right kidney is noted compatible with medical renal disease.  There is no evidence of solid mass, hydronephrosis or definite renal calculi. The right kidney measures 10.1 cm.  Left kidney: Diffusely increased echogenicity of the left kidney is noted compatible with medical renal disease.  There is no evidence of solid mass, hydronephrosis or definite renal calculi.   The left kidney measures 11.1 cm.  Abdominal Aorta:  No abdominal aortic aneurysm identified but the distal abdominal aorta is not well visualized.  There is no evidence of ascites. A tiny right pleural effusion is noted.  IMPRESSION: Echogenic kidneys bilaterally compatible with medical renal disease.  Tiny right pleural effusion.   Original Report Authenticated By: Margarette Canada, M.D.  Dg Abd Acute W/chest  02/09/2013  *RADIOLOGY REPORT*  Clinical Data: Vomiting and abdominal pain  ACUTE ABDOMEN SERIES (ABDOMEN 2 VIEW & CHEST 1 VIEW)  Comparison:  01/20/2012  Findings: Heart size is normal.  No pleural effusion or edema.  No airspace consolidation identified.  There is a right chest wall porta-catheter with tip in the SVC.  Cholecystectomy clips are present within the right upper quadrant of the abdomen.  No dilated loops of small bowel or fluid levels identified.  Gas and stool noted throughout the colon up to the rectum.  IMPRESSION:  1.  No acute cardiopulmonary abnormalities. 2.  Nonobstructive bowel gas pattern.   Original Report Authenticated By: Kerby Moors, M.D.      Medications:    . insulin aspart  0-15 Units Subcutaneous Q4H  . insulin glargine  20 Units Subcutaneous Daily  . ondansetron (ZOFRAN) IV  4 mg Intravenous Q6H    Assessment & Plan  LOS: [age] y.o. year old female with Cyclic Vomiting Syndrome presenting with 2 days of N & V not responding to her OP treatment.  She is having some episodic chest pain as well as epigastric burning and will be evaluated for ACS given her history of prior NSTEMI.  # Cyclic Vomiting & Elevated Lipase: Consistent with her prior episodes.  Lipase likely elevated due to her persistent vomiting and less likely from pancreatitis.  No evidence of acidosis.  Consider Nutcracker syndrome - will try Nicardipine  Start routine meds including: Dilaudid, Ativan, Zofran, Reglan  PPI and GI cocktail  Nicardipine for potential nutcracker syndrome  > Consider OCPs vs Long acting contraception as outpatient as these have an indication for cyclic vomiting   # ACS evaluation: hx of NSTEMI in past.  Less likely but place on Tele and cycle CEs, Risk Stratify  ASA  > cycle CEs  # HTN:  Elevated on admission.  Will restart home Coreg, Starting Nicardipine  # AKI on CKD Stage : Stage II to Stage III - likely diabetic and HTN nephropathy -  Likely due to dehydration.  Will provide IVFs and trend Cr.  Hold ACEi     # Leukocytosis: Likely stress reaction.  Afebrile.  Will check Urine    # DM2: on home  Lantus 40 qhs. Decrease due to decreased PO  Lantus 20 + Moderate SSI  # Substance Abuse: Marijuana - Likely confounding Cyclic Vomiting; Tobacco use: nicotine replacement not indicated due to low use and lack of physical dependence  Social Work consult for substance abuse  --- FEN  *NS @ 145ml/hr -sips and chips, advance as tolerated --- PPx: Heparin    Disposition  Pt to tele.  Will provide hydration and trial nicardipine for potential nutcracker syndrome as confounding.  Will check Urine given leukocytosis but more likely due to vomiting; lipase elevation likely due to vomiting.  Will eval for ACS as well.   Gerda Diss, DO [name] Resident - PGY-1 02/11/2013 2:19 PM

## 2013-02-11 NOTE — Progress Notes (Signed)
Hypoglycemic Event  CBG: 58  Treatment: 1 tube instant glucose  Symptoms: None  Follow-up CBG: Time23:48 CBG Result68  Possible Reasons for Event: Inadequate meal intake  Comments/MD notified:Followed hypoglycemia protocol    Nevin Grizzle Joselita,RN  Remember to initiate Hypoglycemia Order Set & complete

## 2013-02-11 NOTE — ED Notes (Signed)
Pt requesting more pain and nausea medication. EDPA aware.

## 2013-02-11 NOTE — ED Provider Notes (Signed)
History     CSN: [id number]  Arrival date & time 02/11/13  [id number]   First MD Initiated Contact with Patient 02/11/13 0600      Chief Complaint  Patient presents with  . Nausea  . Emesis    (Consider location/radiation/quality/duration/timing/severity/associated sxs/prior treatment) HPI Comments: This is a 47 year old female, past medical history remarkable for diabetes, hypertension, and cyclic vomiting syndrome, who presents emergency department with chief complaint of nausea and vomiting. Patient states that she has been feeling sick for the past 2-3 days. Patient was seen at [facility] long on February 14, and discharged with Phenergan, which is been taking with some relief. However, patient continues to have vomiting, which she says systolic blood tinged, she denies diarrhea. She's also complaining of epigastric abdominal pain. She states the pain is associated with vomiting. She denies any recent alcohol use. She denies fever, headache, chest pain, shortness of breath, numbness and tingling of the extremities.  The history is provided by the patient. No language interpreter was used.    Past Medical History  Diagnosis Date  . PANCREATITIS 05/09/2008  . NEUROPATHY, DIABETIC 02/23/2007  . Cyclic vomiting syndrome   . Hypertension   . Complication of anesthesia     "problems waking up"  . Heart murmur   . Myocardial infarction 07/2007    "they say I've had a silent one; I don't know"  . Pneumonia   . Type II diabetes mellitus   . Anemia   . Blood transfusion   . GERD (gastroesophageal reflux disease)   . Anxiety   . Depression   . Diabetic gastroparesis     /e-chart    Past Surgical History  Procedure Laterality Date  . Port-a-cath placement  ~ 2008    right chest; "poor access; frequent hospitalizations"  . Laparotomy and lysis of adhesions    . Abdominal hysterectomy  02/2002  . Cholecystectomy  1980's    Family History  Problem Relation Age of Onset  . Diabetes type  II Mother   . Diabetes Mother   . Hypertension Mother   . Diabetes type II Sister   . Diabetes Sister   . Hypertension Sister   . Hypertension Brother     History  Substance Use Topics  . Smoking status: Current Some Day Smoker -- 0.50 packs/day for 22 years    Types: Cigarettes    Last Attempt to Quit: 02/22/2012  . Smokeless tobacco: Never Used  . Alcohol Use: No    OB History   Grav Para Term Preterm Abortions TAB SAB Ect Mult Living                  Review of Systems  All other systems reviewed and are negative.    Allergies  Erythromycin; Acetaminophen; and Oxycodone-acetaminophen  Home Medications   Current Outpatient Rx  Name  Route  Sig  Dispense  Refill  . aspirin EC 81 MG tablet   Oral   Take 81 mg by mouth daily.         . carvedilol (COREG) 25 MG tablet   Oral   Take 1 tablet (25 mg total) by mouth 2 (two) times daily with a meal.   30 tablet   11   . cyclobenzaprine (FLEXERIL) 10 MG tablet   Oral   Take 1 tablet (10 mg total) by mouth 3 (three) times daily as needed for muscle spasms.   30 tablet   0   . diclofenac sodium (VOLTAREN) 1 %  GEL   Topical   Apply 1 application topically 4 (four) times daily.   100 g   3   . insulin aspart (NOVOLOG) 100 UNIT/ML injection   Subcutaneous   Inject 4-8 Units into the skin 3 (three) times daily before meals. Sliding scale         . insulin glargine (LANTUS) 100 UNIT/ML injection   Subcutaneous   Inject 40 Units into the skin daily.   10 mL   2   . lisinopril (PRINIVIL,ZESTRIL) 40 MG tablet   Oral   Take 1 tablet (40 mg total) by mouth daily.   30 tablet   11   . Loratadine 10 MG CAPS   Oral   Take 1 capsule (10 mg total) by mouth daily.   30 each   5   . LORazepam (ATIVAN) 1 MG tablet   Oral   Take 1 tablet (1 mg total) by mouth 3 (three) times daily as needed for anxiety.   6 tablet   0   . omeprazole (PRILOSEC) 20 MG capsule   Oral   Take 20 mg by mouth daily as needed.  For acid reflux         . Oxycodone HCl 10 MG TABS   Oral   Take 1 tablet (10 mg total) by mouth 3 (three) times daily as needed. For pain.   90 tablet   0     Fill 12/23   . promethazine (PHENERGAN) 25 MG tablet   Oral   Take 1 tablet (25 mg total) by mouth every 6 (six) hours as needed for nausea.   30 tablet   0   . senna (SENOKOT) 8.6 MG TABS   Oral   Take 2 tablets (17.2 mg total) by mouth 2 (two) times daily. For constipation   60 each   3   . sucralfate (CARAFATE) 1 G tablet      Take 1 tablet (1 g total) by mouth 4 (four) times daily with meals & at bedtime   120 tablet   2     Refill [id number]   . venlafaxine (EFFEXOR) 37.5 MG tablet   Oral   Take 75 mg by mouth daily as needed (takes for hot flashes). . For hot flashes.         Marland Kitchen EPINEPHrine (EPIPEN) 0.3 MG/0.3ML DEVI   Intramuscular   Inject 0.3 mg into the muscle once. as needed for severe anaphylaxis. (bee stings)         . Insulin Syringe-Needle U-100 (PRODIGY INSULIN SYRINGE) 31G X 5/16" 0.3 ML MISC      For Lantus   100 each   6   . ondansetron (ZOFRAN) 4 MG tablet   Oral   Take 1 tablet (4 mg total) by mouth every 8 (eight) hours as needed for nausea.   20 tablet   0   . oxyCODONE (ROXICODONE) 5 MG immediate release tablet   Oral   Take 1 tablet (5 mg total) by mouth every 4 (four) hours as needed for pain.   30 tablet   0     BP 150/91  Pulse 97  Temp(Src) 98.4 F (36.9 C)  Resp 13  SpO2 100%  Physical Exam  Nursing note and vitals reviewed. Constitutional: She is oriented to person, place, and time. She appears well-developed and well-nourished.  HENT:  Head: Normocephalic and atraumatic.  Eyes: Conjunctivae and EOM are normal. Pupils are equal, round, and reactive to light.  Neck: Normal range of motion. Neck supple.  Cardiovascular: Regular rhythm.  Exam reveals no gallop and no friction rub.   No murmur heard. Slightly tachycardic  Pulmonary/Chest: Effort normal and  breath sounds normal. No respiratory distress. She has no wheezes. She has no rales. She exhibits no tenderness.  Abdominal: Soft. Bowel sounds are normal. She exhibits no distension and no mass. There is tenderness. There is no rebound and no guarding.  Tender to palpation over epigastric region, no Murphy's sign, no McBurney point tenderness, no peritoneal signs  Musculoskeletal: Normal range of motion. She exhibits no edema and no tenderness.  Neurological: She is alert and oriented to person, place, and time.  Skin: Skin is warm and dry.  Psychiatric: She has a normal mood and affect. Her behavior is normal. Judgment and thought content normal.    ED Course  Procedures (including critical care time)  Labs Reviewed  CBC - Abnormal; Notable for the following:    WBC 14.1 (*)    All other components within normal limits  COMPREHENSIVE METABOLIC PANEL - Abnormal; Notable for the following:    Sodium 128 (*)    Chloride 82 (*)    Glucose, Bld 386 (*)    BUN 45 (*)    Creatinine, Ser 2.23 (*)    Total Protein 8.4 (*)    GFR calc non Af Amer 25 (*)    GFR calc Af Amer 29 (*)    All other components within normal limits  LIPASE, BLOOD - Abnormal; Notable for the following:    Lipase 215 (*)    All other components within normal limits  BLOOD GAS, VENOUS   8:21 AM PH 7.378   Results for orders placed during the hospital encounter of 02/11/13  CBC      Result Value Range   WBC 14.1 (*) 4.0 - 10.5 K/uL   RBC 4.77  3.87 - 5.11 MIL/uL   Hemoglobin 15.0  12.0 - 15.0 g/dL   HCT 42.1  36.0 - 46.0 %   MCV 88.3  78.0 - 100.0 fL   MCH 31.4  26.0 - 34.0 pg   MCHC 35.6  30.0 - 36.0 g/dL   RDW 13.1  11.5 - 15.5 %   Platelets 214  150 - 400 K/uL  COMPREHENSIVE METABOLIC PANEL      Result Value Range   Sodium 128 (*) 135 - 145 mEq/L   Potassium 3.7  3.5 - 5.1 mEq/L   Chloride 82 (*) 96 - 112 mEq/L   CO2 29  19 - 32 mEq/L   Glucose, Bld 386 (*) 70 - 99 mg/dL   BUN 45 (*) 6 - 23 mg/dL    Creatinine, Ser 2.23 (*) 0.50 - 1.10 mg/dL   Calcium 9.7  8.4 - 10.5 mg/dL   Total Protein 8.4 (*) 6.0 - 8.3 g/dL   Albumin 4.4  3.5 - 5.2 g/dL   AST 19  0 - 37 U/L   ALT 14  0 - 35 U/L   Alkaline Phosphatase 108  39 - 117 U/L   Total Bilirubin 0.8  0.3 - 1.2 mg/dL   GFR calc non Af Amer 25 (*) >90 mL/min   GFR calc Af Amer 29 (*) >90 mL/min  LIPASE, BLOOD      Result Value Range   Lipase 215 (*) 11 - 59 U/L  GLUCOSE, CAPILLARY      Result Value Range   Glucose-Capillary 261 (*) 70 - 99 mg/dL  POCT  I-STAT 3, BLOOD GAS (G3P V)      Result Value Range   pH, Ven 7.378 (*) 7.250 - 7.300   pCO2, Ven 54.6 (*) 45.0 - 50.0 mmHg   pO2, Ven 31.0  30.0 - 45.0 mmHg   Bicarbonate 32.1 (*) 20.0 - 24.0 mEq/L   TCO2 34  0 - 100 mmol/L   O2 Saturation 57.0     Acid-Base Excess 5.0 (*) 0.0 - 2.0 mmol/L   Sample type VENOUS     Comment NOTIFIED PHYSICIAN     US Abdomen Complete  02/11/2013  *RADIOLOGY REPORT*  Clinical Data:  47 year old female with abdominal pain. History of cholecystectomy.  ABDOMINAL ULTRASOUND COMPLETE  Comparison:  12/05/2011 CT and 10/03/2010 ultrasound  Findings:  Gallbladder:   The gallbladder is not visualized compatible with cholecystectomy.  Common Bile Duct:  There is no evidence of intrahepatic or extrahepatic biliary dilation. The CBD measures 8.0 mm in greatest diameter.  Liver: The hepatic parenchyma is slightly heterogeneous. No focal abnormalities are identified.  IVC:  Appears normal.  Pancreas:  Although the pancreas is difficult to visualize in its entirety, no focal pancreatic abnormality is identified.  Spleen:  Within normal limits in size and echotexture.  Right kidney: Diffusely increased echogenicity of the right kidney is noted compatible with medical renal disease.  There is no evidence of solid mass, hydronephrosis or definite renal calculi. The right kidney measures 10.1 cm.  Left kidney: Diffusely increased echogenicity of the left kidney is noted  compatible with medical renal disease.  There is no evidence of solid mass, hydronephrosis or definite renal calculi.   The left kidney measures 11.1 cm.  Abdominal Aorta:  No abdominal aortic aneurysm identified but the distal abdominal aorta is not well visualized.  There is no evidence of ascites. A tiny right pleural effusion is noted.  IMPRESSION: Echogenic kidneys bilaterally compatible with medical renal disease.  Tiny right pleural effusion.   Original Report Authenticated By: Margarette Canada, M.D.    Dg Abd Acute W/chest  02/09/2013  *RADIOLOGY REPORT*  Clinical Data: Vomiting and abdominal pain  ACUTE ABDOMEN SERIES (ABDOMEN 2 VIEW & CHEST 1 VIEW)  Comparison: 01/20/2012  Findings: Heart size is normal.  No pleural effusion or edema.  No airspace consolidation identified.  There is a right chest wall porta-catheter with tip in the SVC.  Cholecystectomy clips are present within the right upper quadrant of the abdomen.  No dilated loops of small bowel or fluid levels identified.  Gas and stool noted throughout the colon up to the rectum.  IMPRESSION:  1.  No acute cardiopulmonary abnormalities. 2.  Nonobstructive bowel gas pattern.   Original Report Authenticated By: Kerby Moors, M.D.       1. Pancreatitis       MDM  47 year old female with nausea and vomiting. Suspicious of pancreatitis, but also possibly diabetic ketoacidosis, will order venous blood gas. Current anion gap is 17. Patient is receiving fluids, will receive 10 units of insulin, pain meds, and Zofran. Additionally, will order ultrasound of the abdomen to evaluate for gallstone pancreatitis.  8:21 AM Patient is not acidotic, elevated gap is due to the hypochloremia.  Will treat with fluids.    12:21 PM Discussed the patient with Dr. Prince Rome.  Patient pain is still poorly controlled, even though she has had several doses of dilaudid.  I am going to consult family practice, to have the patient admitted.  Dr. Prince Rome agrees with the  plan.  Montine Circle, PA-C 02/11/13 1222

## 2013-02-11 NOTE — ED Notes (Signed)
CBG 261 

## 2013-02-11 NOTE — ED Notes (Signed)
MD aware pt still in pain and nauseated.

## 2013-02-11 NOTE — ED Notes (Signed)
Pt presents for evaluation of N/V for the past 2 days, was seen at [facility] on 2/14 for similar symptoms, given prescription for Phenergan without relief.  Pt also having pain from vomiting.  18g LAC, 4 of Zofran given by EMS.

## 2013-02-11 NOTE — ED Notes (Signed)
Patient transported to Ultrasound 

## 2013-02-12 DIAGNOSIS — K224 Dyskinesia of esophagus: Secondary | ICD-10-CM | POA: Diagnosis present

## 2013-02-12 DIAGNOSIS — R1115 Cyclical vomiting syndrome unrelated to migraine: Secondary | ICD-10-CM | POA: Diagnosis not present

## 2013-02-12 LAB — CBC
Hemoglobin: 11.4 g/dL — ABNORMAL LOW (ref 12.0–15.0)
MCH: 31.2 pg (ref 26.0–34.0)
MCHC: 34.5 g/dL (ref 30.0–36.0)
MCV: 90.4 fL (ref 78.0–100.0)
RBC: 3.65 MIL/uL — ABNORMAL LOW (ref 3.87–5.11)

## 2013-02-12 LAB — HEMOGLOBIN A1C
Hgb A1c MFr Bld: 9.2 % — ABNORMAL HIGH (ref <5.7)
Mean Plasma Glucose: 217 mg/dL — ABNORMAL HIGH (ref <117)

## 2013-02-12 LAB — GLUCOSE, CAPILLARY
Glucose-Capillary: 155 mg/dL — ABNORMAL HIGH (ref 70–99)
Glucose-Capillary: 75 mg/dL (ref 70–99)

## 2013-02-12 LAB — COMPREHENSIVE METABOLIC PANEL
ALT: 10 U/L (ref 0–35)
AST: 15 U/L (ref 0–37)
Albumin: 3.1 g/dL — ABNORMAL LOW (ref 3.5–5.2)
Calcium: 8.3 mg/dL — ABNORMAL LOW (ref 8.4–10.5)
Chloride: 100 mEq/L (ref 96–112)
Creatinine, Ser: 1.94 mg/dL — ABNORMAL HIGH (ref 0.50–1.10)
Sodium: 137 mEq/L (ref 135–145)

## 2013-02-12 LAB — LIPID PANEL
Cholesterol: 152 mg/dL (ref 0–200)
HDL: 53 mg/dL (ref >39)
Total CHOL/HDL Ratio: 2.9 RATIO
Triglycerides: 112 mg/dL (ref <150)

## 2013-02-12 LAB — TROPONIN I: Troponin I: <0.3 ng/mL (ref <0.30)

## 2013-02-12 MED ORDER — PANTOPRAZOLE SODIUM 40 MG PO TBEC
40.0000 mg | DELAYED_RELEASE_TABLET | Freq: Every day | ORAL | Status: DC
  Administered 2013-02-12 – 2013-02-13 (×2): 40 mg via ORAL
  Filled 2013-02-12 (×2): qty 1

## 2013-02-12 MED ORDER — OXYCODONE HCL 5 MG PO TABS
5.0000 mg | ORAL_TABLET | ORAL | Status: DC | PRN
  Administered 2013-02-12 – 2013-02-13 (×5): 5 mg via ORAL
  Filled 2013-02-12 (×5): qty 1

## 2013-02-12 MED ORDER — NIFEDIPINE ER OSMOTIC RELEASE 30 MG PO TB24: 30.0000 mg | ORAL_TABLET | Freq: Every day | ORAL | Status: DC

## 2013-02-12 MED ORDER — BOOST / RESOURCE BREEZE PO LIQD
1.0000 | Freq: Two times a day (BID) | ORAL | Status: DC
  Administered 2013-02-13: 1 via ORAL

## 2013-02-12 MED ORDER — ONDANSETRON 8 MG PO TBDP
8.0000 mg | ORAL_TABLET | Freq: Three times a day (TID) | ORAL | Status: DC | PRN
  Filled 2013-02-12: qty 1

## 2013-02-12 MED ORDER — PROMETHAZINE HCL 25 MG PO TABS
25.0000 mg | ORAL_TABLET | Freq: Four times a day (QID) | ORAL | Status: DC | PRN
  Administered 2013-02-12 – 2013-02-13 (×2): 25 mg via ORAL
  Filled 2013-02-12 (×2): qty 1

## 2013-02-12 MED ORDER — WHITE PETROLATUM GEL
Status: AC
  Administered 2013-02-12: 1
  Filled 2013-02-12: qty 5

## 2013-02-12 MED ORDER — LORAZEPAM 1 MG PO TABS
1.0000 mg | ORAL_TABLET | Freq: Three times a day (TID) | ORAL | Status: DC | PRN
  Administered 2013-02-12 – 2013-02-13 (×2): 1 mg via ORAL
  Filled 2013-02-12 (×2): qty 1

## 2013-02-12 NOTE — Progress Notes (Signed)
INITIAL NUTRITION ASSESSMENT  DOCUMENTATION CODES Per approved criteria  -Severe malnutrition in the context of acute illness or injury   INTERVENTION: 1. Resource Breeze po BID, each supplement provides 250 kcal and 9 grams of protein. 2. RD to continue to follow nutrition care plan  NUTRITION DIAGNOSIS: Inadequate oral intake related to poor appetite as evidenced by pt report.   Goal: Pt to meet >/= 90% of their estimated nutrition needs.  Monitor:  weight trends, lab trends, I/O's, PO intake, supplement tolerance  Reason for Assessment: Malnutrition Screening  47 y.o. female  Admitting Dx: n/v  ASSESSMENT: Admitted with 2-day hx of n/v. Hx of cyclic vomiting syndrome. She reports 2 days ago she began developing her typical N&V but was unable to get her home medications refilled due to the weather and inability to travel. Severe episodic abdominal pain that is slightly different than her normal pain. Some epigastric discomfort that she reports as a burning that is more constant. Per MD, pt with potential Nutcracker Syndrome.  Pt with 2 hypoglcemic events last evening/early this morning.  Pt reports that she hasn't had anything to eat since she's been here. All liquids cause her to have a burning sensation. Agreeable to trying Resource Breeze [facility] flavor only.) Discussed weight hx, she reports that she was 174-179 lb approximately 3 weeks ago and she is currently down to 158 lb. This is a weight change of 9% x 3 weeks and is significant. She attributes this weight loss to poor appetite. She states that she has gone >1 day without eating within the past 3 weeks.  Pt meets criteria for severe MALNUTRITION in the context of acute illness as evidenced by 9% wt loss x 3 weeks and intake of <50% x at least 5 days.  Height: Ht Readings from Last 1 Encounters:  02/12/13 5' 1.5" (1.562 m)    Weight: Wt Readings from Last 1 Encounters:  02/11/13 158 lb (71.668 kg)    Ideal  Body Weight: 105 lb/47.7 kg  % Ideal Body Weight: 150%  Wt Readings from Last 10 Encounters:  02/11/13 158 lb (71.668 kg)  01/17/13 159 lb (72.122 kg)  12/15/12 174 lb 4 oz (79.039 kg)  11/07/12 166 lb (75.297 kg)  10/18/12 169 lb (76.658 kg)  10/04/12 158 lb (71.668 kg)  09/23/12 160 lb 4.4 oz (72.7 kg)  09/18/12 162 lb (73.483 kg)  09/17/12 154 lb 12.2 oz (70.2 kg)  09/14/12 158 lb (71.668 kg)    Usual Body Weight: 174 - 179 lb (per pt)  % Usual Body Weight: 91%  BMI:  Body mass index is 29.37 kg/(m^2). Overweight.  Estimated Nutritional Needs: Kcal: 1550 - 1750 kcal Protein: 58 - 78 grams Fluid: 1.6 - 1.8   Skin: intact  Diet Order: Clear Liquid  EDUCATION NEEDS: -No education needs identified at this time   Intake/Output Summary (Last 24 hours) at 02/12/13 1024 Last data filed at 02/12/13 0850  Gross per 24 hour  Intake   4340 ml  Output    200 ml  Net   4140 ml    Last BM: 2/15  Labs:   Recent Labs Lab 02/09/13 1900 02/11/13 0522 02/11/13 1524 02/12/13 0313  NA 140 128*  --  137  K 3.9 3.7  --  3.4*  CL 100 82*  --  100  CO2 22 29  --  29  BUN 30* 45*  --  33*  CREATININE 1.88* 2.23* 1.94* 1.94*  CALCIUM 10.4 9.7  --  8.3*  MG  --   --  1.9  --   GLUCOSE 358* 386*  --  129*    CBG (last 3)   Recent Labs  02/12/13 0007 02/12/13 0415 02/12/13 0735  GLUCAP 75 109* 155*    Scheduled Meds: . aspirin EC  81 mg Oral Daily  . carvedilol  25 mg Oral BID WC  . heparin  5,000 Units Subcutaneous Q8H  . insulin aspart  0-15 Units Subcutaneous Q4H  . insulin glargine  20 Units Subcutaneous Daily  . NIFEdipine  10 mg Oral Q8H  . pantoprazole  40 mg Oral Daily  . sodium chloride  3 mL Intravenous Q12H    Continuous Infusions:   Past Medical History  Diagnosis Date  . PANCREATITIS 05/09/2008  . NEUROPATHY, DIABETIC 02/23/2007  . Cyclic vomiting syndrome   . Hypertension   . Complication of anesthesia     "problems waking up"  . Heart  murmur   . Myocardial infarction 07/2007    "they say I've had a silent one; I don't know"  . Pneumonia   . Type II diabetes mellitus   . Anemia   . Blood transfusion   . GERD (gastroesophageal reflux disease)   . Anxiety   . Depression   . Diabetic gastroparesis     /e-chart    Past Surgical History  Procedure Laterality Date  . Port-a-cath placement  ~ 2008    right chest; "poor access; frequent hospitalizations"  . Laparotomy and lysis of adhesions    . Abdominal hysterectomy  02/2002  . Cholecystectomy  1980's  . Cardiac catheterization    . Dilation and evacuation  X3  . Left hand surgery    . Cataract extraction w/ intraocular lens  implant, bilateral    . Colonoscopy    . Upper gi endoscopy      Inda Coke MS, RD, LDN Pager: [phone] After-hours pager: [phone]

## 2013-02-12 NOTE — ED Provider Notes (Signed)
Medical screening examination/treatment/procedure(s) were performed by non-physician practitioner and as supervising physician I was immediately available for consultation/collaboration.   Hoy Morn, MD 02/12/13 [phone]

## 2013-02-12 NOTE — Progress Notes (Signed)
PGY-1 Daily Progress Note Family Medicine Teaching Service [place] R. Javarious Elsayed, DO Service Pager: [phone]   Subjective: Pt originally wanted to go home but does not think she can leave right now.   Objective:  VITALS Temp:  [97.9 F (36.6 C)-98.4 F (36.9 C)] 98.4 F (36.9 C) (02/17 1400) Pulse Rate:  [68-92] 68 (02/17 1400) Resp:  [20] 20 (02/17 1400) BP: (124-192)/(57-90) 146/64 mmHg (02/17 1400) SpO2:  [99 %-100 %] 100 % (02/17 1400) Weight:  [158 lb (71.668 kg)] 158 lb (71.668 kg) (02/16 2025)  In/Out  Intake/Output Summary (Last 24 hours) at 02/12/13 1619 Last data filed at 02/12/13 1300  Gross per 24 hour  Intake   2460 ml  Output    200 ml  Net   2260 ml    Physical Exam: Gen:  NAD HEENT: moist mucous membranes CV: Regular rate and rhythm, no murmurs rubs or gallops PULM: clear to auscultation bilaterally. No wheezes/rales/rhonchi ABD: +TTP Epigastric, otherwise no TTP Ext: No edema Neuro: Alert and oriented x3  MEDS Scheduled Meds: . aspirin EC  81 mg Oral Daily  . carvedilol  25 mg Oral BID WC  . feeding supplement  1 Container Oral BID BM  . heparin  5,000 Units Subcutaneous Q8H  . insulin aspart  0-15 Units Subcutaneous Q4H  . insulin glargine  20 Units Subcutaneous Daily  . NIFEdipine  10 mg Oral Q8H  . pantoprazole  40 mg Oral Daily  . sodium chloride  3 mL Intravenous Q12H  . white petrolatum       Continuous Infusions:  PRN Meds:.dextrose, gi cocktail, LORazepam, ondansetron, oxyCODONE, promethazine  Labs and imaging:   CBC  Recent Labs Lab 02/11/13 0522 02/11/13 1524 02/12/13 0313  WBC 14.1* 11.9* 11.0*  HGB 15.0 13.7 11.4*  HCT 42.1 39.3 33.0*  PLT 214 173 172   BMET/CMET  Recent Labs Lab 02/09/13 1900 02/11/13 0522 02/11/13 1524 02/12/13 0313  NA 140 128*  --  137  K 3.9 3.7  --  3.4*  CL 100 82*  --  100  CO2 22 29  --  29  BUN 30* 45*  --  33*  CREATININE 1.88* 2.23* 1.94* 1.94*  CALCIUM 10.4 9.7  --  8.3*  PROT 8.3  8.4*  --  6.2  BILITOT 0.5 0.8  --  0.4  ALKPHOS 100 108  --  72  ALT 13 14  --  10  AST 13 19  --  15  GLUCOSE 358* 386*  --  129*   Results for orders placed during the hospital encounter of 02/11/13 (from the past 24 hour(s))  GLUCOSE, CAPILLARY     Status: Abnormal   Collection Time    02/11/13  4:39 PM      Result Value Range   Glucose-Capillary 266 (*) 70 - 99 mg/dL  GLUCOSE, CAPILLARY     Status: None   Collection Time    02/11/13  8:23 PM      Result Value Range   Glucose-Capillary 86  70 - 99 mg/dL   Comment 1 Notify RN    TROPONIN I     Status: None   Collection Time    02/11/13  9:51 PM      Result Value Range   Troponin I <0.30  <0.30 ng/mL  GLUCOSE, CAPILLARY     Status: Abnormal   Collection Time    02/11/13 11:28 PM      Result Value Range   Glucose-Capillary  58 (*) 70 - 99 mg/dL   Comment 1 Notify RN    GLUCOSE, CAPILLARY     Status: Abnormal   Collection Time    02/11/13 11:48 PM      Result Value Range   Glucose-Capillary 65 (*) 70 - 99 mg/dL  GLUCOSE, CAPILLARY     Status: None   Collection Time    02/12/13 12:07 AM      Result Value Range   Glucose-Capillary 75  70 - 99 mg/dL   Comment 1 Call MD NNP PA CNM     Comment 2 Notify RN    TROPONIN I     Status: None   Collection Time    02/12/13  3:13 AM      Result Value Range   Troponin I <0.30  <0.30 ng/mL  COMPREHENSIVE METABOLIC PANEL     Status: Abnormal   Collection Time    02/12/13  3:13 AM      Result Value Range   Sodium 137  135 - 145 mEq/L   Potassium 3.4 (*) 3.5 - 5.1 mEq/L   Chloride 100  96 - 112 mEq/L   CO2 29  19 - 32 mEq/L   Glucose, Bld 129 (*) 70 - 99 mg/dL   BUN 33 (*) 6 - 23 mg/dL   Creatinine, Ser 1.94 (*) 0.50 - 1.10 mg/dL   Calcium 8.3 (*) 8.4 - 10.5 mg/dL   Total Protein 6.2  6.0 - 8.3 g/dL   Albumin 3.1 (*) 3.5 - 5.2 g/dL   AST 15  0 - 37 U/L   ALT 10  0 - 35 U/L   Alkaline Phosphatase 72  39 - 117 U/L   Total Bilirubin 0.4  0.3 - 1.2 mg/dL   GFR calc non Af  Amer 29 (*) >90 mL/min   GFR calc Af Amer 34 (*) >90 mL/min  CBC     Status: Abnormal   Collection Time    02/12/13  3:13 AM      Result Value Range   WBC 11.0 (*) 4.0 - 10.5 K/uL   RBC 3.65 (*) 3.87 - 5.11 MIL/uL   Hemoglobin 11.4 (*) 12.0 - 15.0 g/dL   HCT 33.0 (*) 36.0 - 46.0 %   MCV 90.4  78.0 - 100.0 fL   MCH 31.2  26.0 - 34.0 pg   MCHC 34.5  30.0 - 36.0 g/dL   RDW 13.0  11.5 - 15.5 %   Platelets 172  150 - 400 K/uL  LIPASE, BLOOD     Status: None   Collection Time    02/12/13  3:13 AM      Result Value Range   Lipase 30  11 - 59 U/L  LIPID PANEL     Status: None   Collection Time    02/12/13  3:13 AM      Result Value Range   Cholesterol 152  0 - 200 mg/dL   Triglycerides 112  <150 mg/dL   HDL 53  >39 mg/dL   Total CHOL/HDL Ratio 2.9     VLDL 22  0 - 40 mg/dL   LDL Cholesterol 77  0 - 99 mg/dL  GLUCOSE, CAPILLARY     Status: Abnormal   Collection Time    02/12/13  4:15 AM      Result Value Range   Glucose-Capillary 109 (*) 70 - 99 mg/dL   Comment 1 Notify RN    GLUCOSE, CAPILLARY     Status: Abnormal  Collection Time    02/12/13  7:35 AM      Result Value Range   Glucose-Capillary 155 (*) 70 - 99 mg/dL  GLUCOSE, CAPILLARY     Status: None   Collection Time    02/12/13 11:37 AM      Result Value Range   Glucose-Capillary 95  70 - 99 mg/dL   US Abdomen Complete  02/11/2013  *RADIOLOGY REPORT*  Clinical Data:  47 year old female with abdominal pain. History of cholecystectomy.  ABDOMINAL ULTRASOUND COMPLETE  Comparison:  12/05/2011 CT and 10/03/2010 ultrasound  Findings:  Gallbladder:   The gallbladder is not visualized compatible with cholecystectomy.  Common Bile Duct:  There is no evidence of intrahepatic or extrahepatic biliary dilation. The CBD measures 8.0 mm in greatest diameter.  Liver: The hepatic parenchyma is slightly heterogeneous. No focal abnormalities are identified.  IVC:  Appears normal.  Pancreas:  Although the pancreas is difficult to visualize  in its entirety, no focal pancreatic abnormality is identified.  Spleen:  Within normal limits in size and echotexture.  Right kidney: Diffusely increased echogenicity of the right kidney is noted compatible with medical renal disease.  There is no evidence of solid mass, hydronephrosis or definite renal calculi. The right kidney measures 10.1 cm.  Left kidney: Diffusely increased echogenicity of the left kidney is noted compatible with medical renal disease.  There is no evidence of solid mass, hydronephrosis or definite renal calculi.   The left kidney measures 11.1 cm.  Abdominal Aorta:  No abdominal aortic aneurysm identified but the distal abdominal aorta is not well visualized.  There is no evidence of ascites. A tiny right pleural effusion is noted.  IMPRESSION: Echogenic kidneys bilaterally compatible with medical renal disease.  Tiny right pleural effusion.   Original Report Authenticated By: Margarette Canada, M.D.         Assessment /PLAN  48 y.o. year old female with Cyclic Vomiting Syndrome presenting with 2 days of N & V not responding to her OP treatment.  1) Cyclic Vomiting & Elevated Lipase: Consistent with her prior episodes.  Switched back to PO medications today including her home regiment of Oxycodone and Phenergan, continue Zofran PPI and GI cocktail  Nicardipine for potential nutcracker Esophagus  2)HTN:  Continue home meds, start Procardia   3) AKI on CKD Stage : Stage III to Stage IV - likely diabetic and HTN nephropathy - Likely due to dehydration. Will provide IVFs and trend Cr. Hold ACEi   4)DM2: on home Lantus 40 qhs. Decrease due to decreased PO  Lantus 20 + Moderate SSI   5)Substance Abuse: Marijuana -  Social Work consult for substance abuse  --- FEN  *NS @ 172ml/hr  -sips and chips, advance as tolerated  --- PPx: Heparin   Eladio [place] R. Awanda Mink, DO of [facility] 02/12/2013, 4:21 PM

## 2013-02-12 NOTE — Discharge Summary (Signed)
Physician Discharge Summary  Patient ID: [name] [name] MRN: [id number] DOB: [birth year] Age: 47 y.o.  Admit date: 02/11/2013 Discharge date: 02/12/2013 Admitting Physician: Dickie La, MD  PCP: Verdie Drown, [name], MD  Consultants:None     Discharge Diagnosis:  Active Problems:   Cyclic vomiting syndrome   Uncontrolled secondary diabetes mellitus with stage 3 CKD (GFR 30-59)   Marijuana smoker   Esophageal spasm    Hospital Course [name] [name] is a 47 y.o. year old female presenting with 2 day history of Nausea and Vomiting.   Problem List 1. Cyclic Nausea and Vomiting with concurrent Elevated Lipase - Pt presented to the ED with two days of N/V not responding her home regiment of medications.  This is believed to be related to her chronic marijuana use as has been correlated in the past.  Pt did recently admit to smoking a large amount of marijuana two to three days prior to admission.  She was started on Dilaudid, Ativan, Zofran, and Reglan for her nausea and vomiting and pain.  She was also given a GI cocktail which did not seem to help her much and was given Protonix for reflux Sx.  For consideration of Nutcracker Esophagus, she was given a trial of Procardia, and this was continued on discharge.  Pt improved during her hosopital stay and was d/c on her home regiment of Ativan, Phenergan, and Reglan PRN for her nausea and vomiting.  Her lipase did normalize by the time of discharge and was believed to be related to her nausea and vomiting.   2. HTN - Pt BP elevated on admission and she was continued on her home medication of Coreg.  She was also started on Procardia, BP around [id number] systolic upon d/c.   3. CKD Stage III to IV - Pt Creatinine stable around 1.95 (baseline over the last 2-3 months).  She was given IVF and creatinine remained stable during her hospital stay.    4. DM II - Since not tolerating PO, decreased Lantus 40 qhs to 20 qhs along with moderate SSI.  She was  discharged on her home dose.   5. Substance Abuse - Pt continues to smoke marijuana and does not believe it is related to her vomiting. CSW was consulted but was not able to see the patient before she left.         Discharge PE   Filed Vitals:   02/12/13 0419  BP: 142/70  Pulse: 74  Temp: 98 F (36.7 C)  Resp: 20   Gen: AAO x 3, NAD HEENT: EOMI B/L, [place]/AT, MMM Heart: RRR, No murmur appreciated Lungs: CTAB, no wheezes appreciated Abd: + TTP epigastric region, no TTP other quadrants, NABS, no CVA tenderness Extremities:  No edema present      Procedures/Imaging:  US Abdomen Complete  02/11/2013   IMPRESSION: Echogenic kidneys bilaterally compatible with medical renal disease.  Tiny right pleural effusion.   Original Report Authenticated By: Margarette Canada, M.D.    Dg Abd Acute W/chest  02/09/2013  .  IMPRESSION:  1.  No acute cardiopulmonary abnormalities. 2.  Nonobstructive bowel gas pattern.   Original Report Authenticated By: Kerby Moors, M.D.     Labs  CBC  Recent Labs Lab 02/11/13 0522 02/11/13 1524 02/12/13 0313  WBC 14.1* 11.9* 11.0*  HGB 15.0 13.7 11.4*  HCT 42.1 39.3 33.0*  PLT 214 173 172   BMET  Recent Labs Lab 02/09/13 1900 02/11/13 0522 02/11/13 1524 02/12/13 0313  NA  140 128*  --  137  K 3.9 3.7  --  3.4*  CL 100 82*  --  100  CO2 22 29  --  29  BUN 30* 45*  --  33*  CREATININE 1.88* 2.23* 1.94* 1.94*  CALCIUM 10.4 9.7  --  8.3*  PROT 8.3 8.4*  --  6.2  BILITOT 0.5 0.8  --  0.4  ALKPHOS 100 108  --  72  ALT 13 14  --  10  AST 13 19  --  15  GLUCOSE 358* 386*  --  129*   LIPASE, BLOOD     Status: None   Collection Time    02/12/13  3:13 AM      Result Value Range   Lipase 30  11 - 59 U/L  LIPID PANEL     Status: None   Collection Time    02/12/13  3:13 AM      Result Value Range   Cholesterol 152  0 - 200 mg/dL   Triglycerides 112  <150 mg/dL   HDL 53  >39 mg/dL   Total CHOL/HDL Ratio 2.9     VLDL 22  0 - 40 mg/dL   LDL  Cholesterol 77  0 - 99 mg/dL   Comment:            Total Cholesterol/HDL:CHD Risk     Coronary Heart Disease Risk Table                         Men   Women      1/2 Average Risk   3.4   3.3      Average Risk       5.0   4.4      2 X Average Risk   9.6   7.1      3 X Average Risk  23.4   11.0                Use the calculated Patient Ratio     above and the CHD Risk Table     to determine the patient's CHD Risk.                ATP III CLASSIFICATION (LDL):      <100     mg/dL   Optimal      100-129  mg/dL   Near or Above                        Optimal      130-159  mg/dL   Borderline      160-189  mg/dL   High      >190     mg/dL   Very High       Patient condition at time of discharge/disposition: stable  Disposition-home   Follow up issues: 1. Cyclic Vomiting - make sure pt taking medications and not smoking marijuana.  May need CSW to help with this as she does not believe her vomiting is related to marijuana use.  Also can consider OCP for cyclic vomiting.   2. Consider continuing Procardia XL if helping with possible Nutcracker Esophagus.  If no improvement, d/c med.   Discharge follow up:  Follow-up Information   Follow up with [facility] PARK, ANGELA, MD. (keep your appointment for 02/15/13 @ 3:15 PM)    Contact information:   [place] [address] [phone]  Discharge Orders   Future Appointments Provider Department Dept Phone   02/15/2013 3:15 PM Carolin Guernsey, MD [place] [phone]   Future Orders Complete By Expires     Call MD for:  persistant nausea and vomiting  As directed     Call MD for:  severe uncontrolled pain  As directed     Diet - low sodium heart healthy  As directed     Increase activity slowly  As directed     Increase activity slowly  As directed         Discharge Instructions: Please refer to Patient Instructions section of EMR for full details.  Patient was counseled important signs  and symptoms that should prompt return to medical care, changes in medications, dietary instructions, activity restrictions, and follow up appointments.  Significant instructions noted below:    Discharge Medications   Medication List    TAKE these medications       aspirin EC 81 MG tablet  Take 81 mg by mouth daily.     carvedilol 25 MG tablet  Commonly known as:  COREG  Take 1 tablet (25 mg total) by mouth 2 (two) times daily with a meal.     cyclobenzaprine 10 MG tablet  Commonly known as:  FLEXERIL  Take 1 tablet (10 mg total) by mouth 3 (three) times daily as needed for muscle spasms.     diclofenac sodium 1 % Gel  Commonly known as:  VOLTAREN  Apply 1 application topically 4 (four) times daily.     EPIPEN 0.3 mg/0.3 mL Devi  Generic drug:  EPINEPHrine  Inject 0.3 mg into the muscle once. as needed for severe anaphylaxis. (bee stings)     insulin aspart 100 UNIT/ML injection  Commonly known as:  novoLOG  Inject 4-8 Units into the skin 3 (three) times daily before meals. Sliding scale     insulin glargine 100 UNIT/ML injection  Commonly known as:  LANTUS  Inject 40 Units into the skin daily.     Insulin Syringe-Needle U-100 31G X 5/16" 0.3 ML Misc  Commonly known as:  PRODIGY INSULIN SYRINGE  For Lantus     lisinopril 40 MG tablet  Commonly known as:  PRINIVIL,ZESTRIL  Take 1 tablet (40 mg total) by mouth daily.     Loratadine 10 MG Caps  Take 1 capsule (10 mg total) by mouth daily.     LORazepam 1 MG tablet  Commonly known as:  ATIVAN  Take 1 tablet (1 mg total) by mouth 3 (three) times daily as needed for anxiety.     NIFEdipine 30 MG 24 hr tablet  Commonly known as:  PROCARDIA-XL/ADALAT-CC/NIFEDICAL-XL  Take 1 tablet (30 mg total) by mouth daily.     omeprazole 20 MG capsule  Commonly known as:  PRILOSEC  Take 20 mg by mouth daily as needed. For acid reflux     ondansetron 4 MG tablet  Commonly known as:  ZOFRAN  Take 1 tablet (4 mg total) by mouth  every 8 (eight) hours as needed for nausea.     Oxycodone HCl 10 MG Tabs  Take 1 tablet (10 mg total) by mouth 3 (three) times daily as needed. For pain.     oxyCODONE 5 MG immediate release tablet  Commonly known as:  ROXICODONE  Take 1 tablet (5 mg total) by mouth every 4 (four) hours as needed for pain.     promethazine 25 MG tablet  Commonly known  as:  PHENERGAN  Take 1 tablet (25 mg total) by mouth every 6 (six) hours as needed for nausea.     senna 8.6 MG Tabs  Commonly known as:  SENOKOT  Take 2 tablets (17.2 mg total) by mouth 2 (two) times daily. For constipation     sucralfate 1 G tablet  Commonly known as:  CARAFATE  Take 1 tablet (1 g total) by mouth 4 (four) times daily with meals & at bedtime     venlafaxine 37.5 MG tablet  Commonly known as:  EFFEXOR  Take 75 mg by mouth daily as needed (takes for hot flashes). . For hot flashes.            Kennith Maes, DO of [name] [place] 02/12/2013 11:32 AM

## 2013-02-12 NOTE — Discharge Summary (Signed)
Family Medicine Teaching Service  Discharge Note : Attending [name] MD Pager [phone] Inpatient Team Pager:  [phone]  I have seen and examined this patient, reviewed their chart and discussed discharge planning with the resident at the time of discharge. I agree with the discharge plan as above.  Patient much improved today.  Tolerating PO liquids well.  No further episodes of emesis overnight or this AM.  Epigastric pain improved somewhat as well.  Asking to go home as she feels better.  Plan to DC home with anti-emetics, analgesics.  Patient already with FU with PCP later this week.

## 2013-02-12 NOTE — Progress Notes (Signed)
Pt attempted to eat a heart healthy diet for the first time, and the epigastric pain became unbearable, to the point that she was in the room crying. Pain is persisting despite medicating with both PO Oxy and GI cocktail. Pt does not feel that she is ready to go home, even though this morning, pt stated that she was ready to be discharged. I notified the MD of this change. Plan to keep pt until further notice.

## 2013-02-12 NOTE — Progress Notes (Signed)
FMTS Attending Daily Note:  [name] Sabal MD  [phone] pager  Family Practice pager:  [phone] I have seen and examined this patient and have reviewed their chart. I have discussed this patient with the resident. I agree with the resident's findings, assessment and care plan.  Please see my DC addendum for details today, thought was that patient would go home today but not able to tolerate PO challenge.  Will re-eval tomorrow after continued analgesics and anti-emetics overnight.

## 2013-02-12 NOTE — Progress Notes (Signed)
Hypoglycemic Event  CBG: 68  Treatment: 15 GM carbohydrate snack  Symptoms: None  Follow-up CBG: Time:00:07 CBG Result:75  Possible Reasons for Event: Inadequate meal intake  Comments/MD notified:Per hypoglycemia protocol    Jahkari Maclin Joselita,RN  Remember to initiate Hypoglycemia Order Set & complete

## 2013-02-12 NOTE — H&P (Signed)
FMTS Attending Admission Note: [name] Schueller MD [phone] pager office [phone] I  have seen and examined this patient, reviewed their chart. I have discussed this patient with the resident. I agree with the resident's findings, assessment and care plan. 

## 2013-02-13 DIAGNOSIS — K219 Gastro-esophageal reflux disease without esophagitis: Secondary | ICD-10-CM | POA: Diagnosis not present

## 2013-02-13 DIAGNOSIS — F339 Major depressive disorder, recurrent, unspecified: Secondary | ICD-10-CM

## 2013-02-13 DIAGNOSIS — K224 Dyskinesia of esophagus: Secondary | ICD-10-CM | POA: Diagnosis not present

## 2013-02-13 DIAGNOSIS — R1115 Cyclical vomiting syndrome unrelated to migraine: Secondary | ICD-10-CM | POA: Diagnosis not present

## 2013-02-13 NOTE — Progress Notes (Signed)
Inpatient Diabetes Program Recommendations  AACE/ADA: New Consensus Statement on Inpatient Glycemic Control (2013)  Target Ranges:  Prepandial:   less than 140 mg/dL      Peak postprandial:   less than 180 mg/dL (1-2 hours)      Critically ill patients:  140 - 180 mg/dL    Results for QUENTELLA, MCGUFFIE (MRN [id number]) as of 02/13/2013 08:54  Ref. Range 02/11/2013 23:28 02/11/2013 23:48 02/12/2013 00:07 02/12/2013 04:15 02/12/2013 07:35 02/12/2013 11:37 02/12/2013 17:11 02/12/2013 20:04  Glucose-Capillary Latest Range: 70-99 mg/dL 58 (L) 65 (L) 75 109 (H) 155 (H) 95 122 (H) 79   Results for ELLINE, SOLANO (MRN [id number]) as of 02/13/2013 08:54  Ref. Range 02/12/2013 23:57 02/13/2013 03:58 02/13/2013 05:07 02/13/2013 07:48  Glucose-Capillary Latest Range: 70-99 mg/dL 73 55 (L) 88 98   Patient had hypoglycemia yesterday at midnight and again this morning at 4am.  May need a slight reduction in basal insulin.  Could start with 20% reduction.  Inpatient Diabetes Program Recommendations Insulin - Basal: Please consider decreasing Lantus to 16 units daily.  Note: Will follow. Wyn Quaker RN, MSN, CDE Diabetes Coordinator Inpatient Diabetes Program [phone]

## 2013-02-13 NOTE — Discharge Summary (Signed)
Physician Discharge Summary  Patient ID: [name] [name] MRN: [id number] DOB: 1966/06/21 Age: 47 y.o.  Admit date: 02/11/2013 Discharge date: 02/13/2013 Admitting Physician: Dickie La, MD  PCP: Verdie Drown, [name], MD  Consultants:None     Discharge Diagnosis:  Active Problems:   Cyclic vomiting syndrome   Uncontrolled secondary diabetes mellitus with stage 3 CKD (GFR 30-59)   Marijuana smoker   Esophageal spasm    Hospital Course [name] [name] is a 47 y.o. year old female presenting with 2 day history of Nausea and Vomiting.   Problem List 1. Cyclic Nausea and Vomiting with concurrent Elevated Lipase - Pt presented to the ED with two days of N/V not responding her home regiment of medications.  This is believed to be related to her chronic marijuana use as has been correlated in the past.  Pt did recently admit to smoking a large amount of marijuana two to three days prior to admission.  She was started on Dilaudid, Ativan, Zofran, and Reglan for her nausea and vomiting and pain.  She was also given a GI cocktail which did not seem to help her much and was given Protonix for reflux Sx.  For consideration of Nutcracker Esophagus, she was given a trial of Procardia, and this was continued on discharge.  Pt improved during her hosopital stay and was d/c on her home regiment of Ativan, Phenergan, and Reglan PRN for her nausea and vomiting.  Her lipase did normalize by the time of discharge and was believed to be related to her nausea and vomiting.   2. HTN - Pt BP elevated on admission and she was continued on her home medication of Coreg.  She was also started on Procardia, BP around [id number] systolic upon d/c.   3. CKD Stage III to IV - Pt Creatinine stable around 1.95 (baseline over the last 2-3 months).  She was given IVF and creatinine remained stable during her hospital stay.    4. DM II - Since not tolerating PO, decreased Lantus 40 qhs to 20 qhs along with moderate SSI.  She was  discharged on her home dose.   5. Substance Abuse - Pt continues to smoke marijuana and does not believe it is related to her vomiting. CSW was consulted but was not able to see the patient before she left.         Discharge PE   Filed Vitals:   02/13/13 0916  BP: 142/81  Pulse: 75  Temp: 99.2 F (37.3 C)  Resp: 18   Gen: AAO x 3, NAD HEENT: EOMI B/L, [place]/AT, MMM Heart: RRR, No murmur appreciated Lungs: CTAB, no wheezes appreciated Abd: + TTP epigastric region, no TTP other quadrants, NABS, no CVA tenderness Extremities:  No edema present      Procedures/Imaging:  US Abdomen Complete  02/11/2013   IMPRESSION: Echogenic kidneys bilaterally compatible with medical renal disease.  Tiny right pleural effusion.   Original Report Authenticated By: Margarette Canada, M.D.    Dg Abd Acute W/chest  02/09/2013  .  IMPRESSION:  1.  No acute cardiopulmonary abnormalities. 2.  Nonobstructive bowel gas pattern.   Original Report Authenticated By: Kerby Moors, M.D.     Labs  CBC  Recent Labs Lab 02/11/13 0522 02/11/13 1524 02/12/13 0313  WBC 14.1* 11.9* 11.0*  HGB 15.0 13.7 11.4*  HCT 42.1 39.3 33.0*  PLT 214 173 172   BMET  Recent Labs Lab 02/09/13 1900 02/11/13 0522 02/11/13 1524 02/12/13 0313  NA  140 128*  --  137  K 3.9 3.7  --  3.4*  CL 100 82*  --  100  CO2 22 29  --  29  BUN 30* 45*  --  33*  CREATININE 1.88* 2.23* 1.94* 1.94*  CALCIUM 10.4 9.7  --  8.3*  PROT 8.3 8.4*  --  6.2  BILITOT 0.5 0.8  --  0.4  ALKPHOS 100 108  --  72  ALT 13 14  --  10  AST 13 19  --  15  GLUCOSE 358* 386*  --  129*   LIPASE, BLOOD     Status: None   Collection Time    02/12/13  3:13 AM      Result Value Range   Lipase 30  11 - 59 U/L  LIPID PANEL     Status: None   Collection Time    02/12/13  3:13 AM      Result Value Range   Cholesterol 152  0 - 200 mg/dL   Triglycerides 112  <150 mg/dL   HDL 53  >39 mg/dL   Total CHOL/HDL Ratio 2.9     VLDL 22  0 - 40 mg/dL   LDL  Cholesterol 77  0 - 99 mg/dL   Comment:            Total Cholesterol/HDL:CHD Risk     Coronary Heart Disease Risk Table                         Men   Women      1/2 Average Risk   3.4   3.3      Average Risk       5.0   4.4      2 X Average Risk   9.6   7.1      3 X Average Risk  23.4   11.0                Use the calculated Patient Ratio     above and the CHD Risk Table     to determine the patient's CHD Risk.                ATP III CLASSIFICATION (LDL):      <100     mg/dL   Optimal      100-129  mg/dL   Near or Above                        Optimal      130-159  mg/dL   Borderline      160-189  mg/dL   High      >190     mg/dL   Very High       Patient condition at time of discharge/disposition: stable  Disposition-home   Follow up issues: 1. Cyclic Vomiting - make sure pt taking medications and not smoking marijuana.  May need CSW to help with this as she does not believe her vomiting is related to marijuana use.  Also can consider OCP for cyclic vomiting.   2. Consider continuing Procardia XL if helping with possible Nutcracker Esophagus.  If no improvement, d/c med.   Discharge follow up:  Follow-up Information   Follow up with [facility] PARK, ANGELA, MD. (keep your appointment for 02/15/13 @ 3:15 PM)    Contact information:   [place] [address] [phone]  Discharge Orders   Future Appointments Provider Department Dept Phone   02/15/2013 3:15 PM Carolin Guernsey, MD [place] [phone]   Future Orders Complete By Expires     Call MD for:  persistant nausea and vomiting  As directed     Call MD for:  severe uncontrolled pain  As directed     Diet - low sodium heart healthy  As directed     Increase activity slowly  As directed     Increase activity slowly  As directed         Discharge Instructions: Please refer to Patient Instructions section of EMR for full details.  Patient was counseled important  signs and symptoms that should prompt return to medical care, changes in medications, dietary instructions, activity restrictions, and follow up appointments.  Significant instructions noted below:    Discharge Medications   Medication List    TAKE these medications       aspirin EC 81 MG tablet  Take 81 mg by mouth daily.     carvedilol 25 MG tablet  Commonly known as:  COREG  Take 1 tablet (25 mg total) by mouth 2 (two) times daily with a meal.     cyclobenzaprine 10 MG tablet  Commonly known as:  FLEXERIL  Take 1 tablet (10 mg total) by mouth 3 (three) times daily as needed for muscle spasms.     diclofenac sodium 1 % Gel  Commonly known as:  VOLTAREN  Apply 1 application topically 4 (four) times daily.     EPIPEN 0.3 mg/0.3 mL Devi  Generic drug:  EPINEPHrine  Inject 0.3 mg into the muscle once. as needed for severe anaphylaxis. (bee stings)     insulin aspart 100 UNIT/ML injection  Commonly known as:  novoLOG  Inject 4-8 Units into the skin 3 (three) times daily before meals. Sliding scale     insulin glargine 100 UNIT/ML injection  Commonly known as:  LANTUS  Inject 40 Units into the skin daily.     Insulin Syringe-Needle U-100 31G X 5/16" 0.3 ML Misc  Commonly known as:  PRODIGY INSULIN SYRINGE  For Lantus     lisinopril 40 MG tablet  Commonly known as:  PRINIVIL,ZESTRIL  Take 1 tablet (40 mg total) by mouth daily.     Loratadine 10 MG Caps  Take 1 capsule (10 mg total) by mouth daily.     LORazepam 1 MG tablet  Commonly known as:  ATIVAN  Take 1 tablet (1 mg total) by mouth 3 (three) times daily as needed for anxiety.     NIFEdipine 30 MG 24 hr tablet  Commonly known as:  PROCARDIA-XL/ADALAT-CC/NIFEDICAL-XL  Take 1 tablet (30 mg total) by mouth daily.     omeprazole 20 MG capsule  Commonly known as:  PRILOSEC  Take 20 mg by mouth daily as needed. For acid reflux     ondansetron 4 MG tablet  Commonly known as:  ZOFRAN  Take 1 tablet (4 mg total) by  mouth every 8 (eight) hours as needed for nausea.     Oxycodone HCl 10 MG Tabs  Take 1 tablet (10 mg total) by mouth 3 (three) times daily as needed. For pain.     oxyCODONE 5 MG immediate release tablet  Commonly known as:  ROXICODONE  Take 1 tablet (5 mg total) by mouth every 4 (four) hours as needed for pain.     promethazine 25 MG tablet  Commonly known  as:  PHENERGAN  Take 1 tablet (25 mg total) by mouth every 6 (six) hours as needed for nausea.     senna 8.6 MG Tabs  Commonly known as:  SENOKOT  Take 2 tablets (17.2 mg total) by mouth 2 (two) times daily. For constipation     sucralfate 1 G tablet  Commonly known as:  CARAFATE  Take 1 tablet (1 g total) by mouth 4 (four) times daily with meals & at bedtime     venlafaxine 37.5 MG tablet  Commonly known as:  EFFEXOR  Take 75 mg by mouth daily as needed (takes for hot flashes). . For hot flashes.            Kennith Maes, DO of [name] 02/13/2013 12:35 PM

## 2013-02-13 NOTE — Progress Notes (Signed)
Hypoglycemic Event  CBG: 55  Treatment: 15 GM carbohydrate snack  Symptoms: None  Follow-up CBG: Time:0508 CBG Result:88  Possible Reasons for Event: Inadequate meal intake  Comments/MD notified:    Eloy End  Remember to initiate Hypoglycemia Order Set & complete

## 2013-02-13 NOTE — Discharge Summary (Signed)
Family Medicine Teaching Service  Discharge Note : Attending [name] MD Pager [phone] Inpatient Team Pager:  [phone]  I have seen and examined this patient, reviewed their chart and discussed discharge planning with the resident at the time of discharge. I agree with the discharge plan as above.

## 2013-02-13 NOTE — Progress Notes (Addendum)
Discussed discharge and medication instructions with pt. Pt still leery about going home, but I instructed pt to follow her medication instructions and call the doctor if she is unable to keep down any food/liquids. Pt has not had any episodes of vomiting, just epigastric pain. Assessment unchanged from morning. Pt awaiting ride home with son or daughter.

## 2013-02-14 LAB — GLUCOSE, CAPILLARY: Glucose-Capillary: 168 mg/dL — ABNORMAL HIGH (ref 70–99)

## 2013-02-15 ENCOUNTER — Ambulatory Visit (INDEPENDENT_AMBULATORY_CARE_PROVIDER_SITE_OTHER): Payer: Medicare Other | Admitting: Family Medicine

## 2013-02-15 VITALS — BP 137/76 | HR 87 | Temp 98.5°F | Wt 161.0 lb

## 2013-02-15 DIAGNOSIS — R1115 Cyclical vomiting syndrome unrelated to migraine: Secondary | ICD-10-CM

## 2013-02-15 MED ORDER — INSULIN GLARGINE 100 UNIT/ML ~~LOC~~ SOLN: 40.0000 [IU] | Freq: Every day | SUBCUTANEOUS | Status: DC

## 2013-02-15 MED ORDER — OXYCODONE HCL 10 MG PO TABS: 10.0000 mg | ORAL_TABLET | Freq: Three times a day (TID) | ORAL | Status: DC | PRN

## 2013-02-16 NOTE — Progress Notes (Signed)
  Subjective:    Patient ID: [name] [name], female    DOB: Mar 27, 1966, 47 y.o.   MRN: [id number]  HPI # Hospital follow-up. Exacerbation of cyclical vomiting syndrome.  She has been doing well since hospital discharge with no further exacerbations. She has not taken Procardia for potential nutcracker esophagus.  She did not refill her oxycodone given following hospital visit because it is a lower dose than what she usually takes. She is asking for refill.   Review of Systems Denies fevers, chills  Allergies, medication, past medical history reviewed.  Smoking status noted.      Objective:   Physical Exam GEN: NAD ABD: soft, NT, ND     Assessment & Plan:

## 2013-02-18 NOTE — Assessment & Plan Note (Signed)
Hospital follow-up following short admission. Her symptoms are now resolved. As usual etiology of exacerbation unclear; marijuana suspected, however, she smokes marijuana regularly without exacerbations. Ativan, oxycodone refilled.

## 2013-02-20 ENCOUNTER — Encounter: Payer: Self-pay | Admitting: Family Medicine

## 2013-02-20 ENCOUNTER — Ambulatory Visit: Payer: Medicare Other | Admitting: Family Medicine

## 2013-03-01 ENCOUNTER — Telehealth: Payer: Self-pay | Admitting: Family Medicine

## 2013-03-01 MED ORDER — DICLOFENAC SODIUM 1 % TD GEL: 1.0000 "application " | Freq: Four times a day (QID) | TRANSDERMAL | Status: DC

## 2013-03-01 NOTE — Telephone Encounter (Signed)
Was supposed to get refill for her Volteran gel and pharmacy states they have not gotten it.  This was discussed at last visit.  [facility]- [address]

## 2013-03-01 NOTE — Telephone Encounter (Signed)
Please notify Rx sent

## 2013-03-02 ENCOUNTER — Observation Stay (HOSPITAL_COMMUNITY)
Admission: EM | Admit: 2013-03-02 | Discharge: 2013-03-05 | Disposition: A | Discharge status: Home / Self Care | Payer: Medicare Other | Attending: Family Medicine | Admitting: Family Medicine

## 2013-03-02 ENCOUNTER — Encounter (HOSPITAL_COMMUNITY): Payer: Self-pay | Admitting: Emergency Medicine

## 2013-03-02 DIAGNOSIS — E1329 Other specified diabetes mellitus with other diabetic kidney complication: Secondary | ICD-10-CM

## 2013-03-02 DIAGNOSIS — N184 Chronic kidney disease, stage 4 (severe): Secondary | ICD-10-CM | POA: Insufficient documentation

## 2013-03-02 DIAGNOSIS — K3184 Gastroparesis: Secondary | ICD-10-CM | POA: Insufficient documentation

## 2013-03-02 DIAGNOSIS — N183 Chronic kidney disease, stage 3 unspecified: Secondary | ICD-10-CM

## 2013-03-02 DIAGNOSIS — E1322 Other specified diabetes mellitus with diabetic chronic kidney disease: Secondary | ICD-10-CM | POA: Diagnosis present

## 2013-03-02 DIAGNOSIS — E1169 Type 2 diabetes mellitus with other specified complication: Secondary | ICD-10-CM | POA: Insufficient documentation

## 2013-03-02 DIAGNOSIS — E1149 Type 2 diabetes mellitus with other diabetic neurological complication: Secondary | ICD-10-CM | POA: Insufficient documentation

## 2013-03-02 DIAGNOSIS — F1210 Cannabis abuse, uncomplicated: Secondary | ICD-10-CM | POA: Insufficient documentation

## 2013-03-02 DIAGNOSIS — I10 Essential (primary) hypertension: Secondary | ICD-10-CM

## 2013-03-02 DIAGNOSIS — I129 Hypertensive chronic kidney disease with stage 1 through stage 4 chronic kidney disease, or unspecified chronic kidney disease: Secondary | ICD-10-CM | POA: Insufficient documentation

## 2013-03-02 DIAGNOSIS — R1115 Cyclical vomiting syndrome unrelated to migraine: Principal | ICD-10-CM

## 2013-03-02 DIAGNOSIS — F1290 Cannabis use, unspecified, uncomplicated: Secondary | ICD-10-CM | POA: Diagnosis present

## 2013-03-02 DIAGNOSIS — E1142 Type 2 diabetes mellitus with diabetic polyneuropathy: Secondary | ICD-10-CM | POA: Insufficient documentation

## 2013-03-02 HISTORY — DX: Cannabis use, unspecified, uncomplicated: F12.90

## 2013-03-02 HISTORY — DX: Nicotine dependence, unspecified, uncomplicated: F17.200

## 2013-03-02 LAB — URINALYSIS, ROUTINE W REFLEX MICROSCOPIC
Bilirubin Urine: NEGATIVE
Glucose, UA: >1000 mg/dL — AB
Ketones, ur: NEGATIVE mg/dL
Leukocytes, UA: NEGATIVE
Nitrite: NEGATIVE
Protein, ur: 30 mg/dL — AB
Specific Gravity, Urine: 1.015 (ref 1.005–1.030)
Urobilinogen, UA: 0.2 mg/dL (ref 0.0–1.0)
pH: 7 (ref 5.0–8.0)

## 2013-03-02 LAB — CBC WITH DIFFERENTIAL/PLATELET
Basophils Absolute: 0 10*3/uL (ref 0.0–0.1)
Basophils Relative: 0 % (ref 0–1)
Eosinophils Absolute: 0.1 10*3/uL (ref 0.0–0.7)
Eosinophils Relative: 1 % (ref 0–5)
HCT: 33.6 % — ABNORMAL LOW (ref 36.0–46.0)
Hemoglobin: 12.2 g/dL (ref 12.0–15.0)
Lymphocytes Relative: 23 % (ref 12–46)
Lymphs Abs: 2.1 10*3/uL (ref 0.7–4.0)
MCH: 31.9 pg (ref 26.0–34.0)
MCHC: 36.3 g/dL — ABNORMAL HIGH (ref 30.0–36.0)
MCV: 88 fL (ref 78.0–100.0)
Monocytes Absolute: 0.4 10*3/uL (ref 0.1–1.0)
Monocytes Relative: 5 % (ref 3–12)
Neutro Abs: 6.3 10*3/uL (ref 1.7–7.7)
Neutrophils Relative %: 71 % (ref 43–77)
Platelets: 210 10*3/uL (ref 150–400)
RBC: 3.82 MIL/uL — ABNORMAL LOW (ref 3.87–5.11)
RDW: 13 % (ref 11.5–15.5)
WBC: 8.9 10*3/uL (ref 4.0–10.5)

## 2013-03-02 LAB — CBC
HCT: 33 % — ABNORMAL LOW (ref 36.0–46.0)
Hemoglobin: 12 g/dL (ref 12.0–15.0)
MCHC: 36.4 g/dL — ABNORMAL HIGH (ref 30.0–36.0)
RBC: 3.77 MIL/uL — ABNORMAL LOW (ref 3.87–5.11)

## 2013-03-02 LAB — GLUCOSE, CAPILLARY
Glucose-Capillary: 349 mg/dL — ABNORMAL HIGH (ref 70–99)
Glucose-Capillary: 200 mg/dL — ABNORMAL HIGH (ref 70–99)

## 2013-03-02 LAB — BASIC METABOLIC PANEL
BUN: 31 mg/dL — ABNORMAL HIGH (ref 6–23)
CO2: 25 mEq/L (ref 19–32)
Calcium: 10.5 mg/dL (ref 8.4–10.5)
Chloride: 97 mEq/L (ref 96–112)
Creatinine, Ser: 1.84 mg/dL — ABNORMAL HIGH (ref 0.50–1.10)
GFR calc Af Amer: 36 mL/min — ABNORMAL LOW (ref >90)
GFR calc non Af Amer: 31 mL/min — ABNORMAL LOW (ref >90)
Glucose, Bld: 446 mg/dL — ABNORMAL HIGH (ref 70–99)
Potassium: 3.7 mEq/L (ref 3.5–5.1)
Sodium: 136 mEq/L (ref 135–145)

## 2013-03-02 LAB — URINE MICROSCOPIC-ADD ON

## 2013-03-02 LAB — LIPASE, BLOOD
Lipase: 59 U/L (ref 11–59)
Lipase: 48 U/L (ref 11–59)

## 2013-03-02 MED ORDER — PROMETHAZINE HCL 25 MG/ML IJ SOLN
12.5000 mg | Freq: Once | INTRAMUSCULAR | Status: AC
  Administered 2013-03-02: 12.5 mg via INTRAVENOUS
  Filled 2013-03-02: qty 1

## 2013-03-02 MED ORDER — ONDANSETRON HCL 4 MG/2ML IJ SOLN
4.0000 mg | Freq: Four times a day (QID) | INTRAMUSCULAR | Status: DC | PRN
  Administered 2013-03-02 – 2013-03-04 (×5): 4 mg via INTRAVENOUS
  Filled 2013-03-02 (×5): qty 2

## 2013-03-02 MED ORDER — INSULIN GLARGINE 100 UNIT/ML ~~LOC~~ SOLN
40.0000 [IU] | Freq: Every day | SUBCUTANEOUS | Status: DC
  Administered 2013-03-02: 40 [IU] via SUBCUTANEOUS

## 2013-03-02 MED ORDER — LABETALOL HCL 5 MG/ML IV SOLN
10.0000 mg | Freq: Once | INTRAVENOUS | Status: AC
Start: 2013-03-02 — End: 2013-03-02
  Administered 2013-03-02: 10 mg via INTRAVENOUS
  Filled 2013-03-02: qty 4

## 2013-03-02 MED ORDER — INSULIN ASPART 100 UNIT/ML ~~LOC~~ SOLN
0.0000 [IU] | Freq: Three times a day (TID) | SUBCUTANEOUS | Status: DC
  Administered 2013-03-02: 11 [IU] via SUBCUTANEOUS
  Administered 2013-03-03 (×2): 3 [IU] via SUBCUTANEOUS
  Administered 2013-03-03 – 2013-03-05 (×2): 2 [IU] via SUBCUTANEOUS
  Filled 2013-03-02: qty 1

## 2013-03-02 MED ORDER — HYDRALAZINE HCL 20 MG/ML IJ SOLN
10.0000 mg | Freq: Four times a day (QID) | INTRAMUSCULAR | Status: DC | PRN
  Administered 2013-03-02: 10 mg via INTRAVENOUS
  Filled 2013-03-02: qty 0.5

## 2013-03-02 MED ORDER — HYDROMORPHONE HCL PF 1 MG/ML IJ SOLN
0.2500 mg | INTRAMUSCULAR | Status: DC | PRN
  Administered 2013-03-02: 0.25 mg via INTRAVENOUS
  Filled 2013-03-02: qty 1

## 2013-03-02 MED ORDER — EPINEPHRINE 0.3 MG/0.3ML IJ DEVI: 0.3000 mg | Freq: Once | INTRAMUSCULAR | Status: DC

## 2013-03-02 MED ORDER — PANTOPRAZOLE SODIUM 40 MG IV SOLR
40.0000 mg | Freq: Once | INTRAVENOUS | Status: AC
  Administered 2013-03-02: 40 mg via INTRAVENOUS
  Filled 2013-03-02: qty 40

## 2013-03-02 MED ORDER — SODIUM CHLORIDE 0.9 % IV SOLN
INTRAVENOUS | Status: DC
  Administered 2013-03-02: 19:00:00 via INTRAVENOUS
  Administered 2013-03-03: 125 mL/h via INTRAVENOUS
  Administered 2013-03-03 – 2013-03-05 (×4): via INTRAVENOUS

## 2013-03-02 MED ORDER — LISINOPRIL 40 MG PO TABS
40.0000 mg | ORAL_TABLET | Freq: Every day | ORAL | Status: DC
  Administered 2013-03-03 – 2013-03-05 (×3): 40 mg via ORAL
  Filled 2013-03-02 (×3): qty 1

## 2013-03-02 MED ORDER — PROMETHAZINE HCL 25 MG/ML IJ SOLN
12.5000 mg | Freq: Four times a day (QID) | INTRAMUSCULAR | Status: DC | PRN
  Administered 2013-03-02 – 2013-03-03 (×2): 12.5 mg via INTRAVENOUS
  Filled 2013-03-02 (×2): qty 1

## 2013-03-02 MED ORDER — HYDROMORPHONE HCL PF 1 MG/ML IJ SOLN
1.0000 mg | Freq: Once | INTRAMUSCULAR | Status: AC
  Administered 2013-03-02: 1 mg via INTRAVENOUS
  Filled 2013-03-02: qty 1

## 2013-03-02 MED ORDER — GI COCKTAIL ~~LOC~~
30.0000 mL | Freq: Once | ORAL | Status: AC
  Administered 2013-03-02: 30 mL via ORAL
  Filled 2013-03-02: qty 30

## 2013-03-02 MED ORDER — CARVEDILOL 25 MG PO TABS
25.0000 mg | ORAL_TABLET | Freq: Two times a day (BID) | ORAL | Status: DC
  Administered 2013-03-03 – 2013-03-05 (×5): 25 mg via ORAL
  Filled 2013-03-02 (×7): qty 1

## 2013-03-02 MED ORDER — HYDRALAZINE HCL 20 MG/ML IJ SOLN
15.0000 mg | Freq: Four times a day (QID) | INTRAMUSCULAR | Status: DC | PRN
  Administered 2013-03-03: 15 mg via INTRAVENOUS
  Filled 2013-03-02 (×2): qty 0.75

## 2013-03-02 MED ORDER — PANTOPRAZOLE SODIUM 40 MG IV SOLR
40.0000 mg | INTRAVENOUS | Status: DC
  Administered 2013-03-02 – 2013-03-03 (×2): 40 mg via INTRAVENOUS
  Filled 2013-03-02 (×3): qty 40

## 2013-03-02 MED ORDER — LORAZEPAM 2 MG/ML IJ SOLN
0.5000 mg | INTRAMUSCULAR | Status: DC | PRN
  Administered 2013-03-02 – 2013-03-04 (×4): 0.5 mg via INTRAVENOUS
  Filled 2013-03-02 (×4): qty 1

## 2013-03-02 MED ORDER — HYDROMORPHONE HCL PF 1 MG/ML IJ SOLN
0.5000 mg | INTRAMUSCULAR | Status: DC | PRN
  Administered 2013-03-02 – 2013-03-05 (×15): 0.5 mg via INTRAVENOUS
  Filled 2013-03-02 (×15): qty 1

## 2013-03-02 MED ORDER — SODIUM CHLORIDE 0.9 % IV BOLUS (SEPSIS)
2000.0000 mL | Freq: Once | INTRAVENOUS | Status: AC
  Administered 2013-03-02: 1000 mL via INTRAVENOUS

## 2013-03-02 NOTE — ED Notes (Signed)
Port access and blood draw from port by chris-rn.

## 2013-03-02 NOTE — ED Notes (Signed)
Family practice at bedside.

## 2013-03-02 NOTE — ED Provider Notes (Signed)
History     CSN: [id number]  Arrival date & time 03/02/13  1147   First MD Initiated Contact with Patient 03/02/13 1150      Chief Complaint  Patient presents with  . Nausea    (Consider location/radiation/quality/duration/timing/severity/associated sxs/prior treatment) HPI Patient presents to the emergency department with vomiting since yesterday.  Patient, states, that she's had episodes like this in the past with cyclic vomiting syndrome.  Patient, states, that she's had some blood in her vomitus.  Patient denies chest pain, shortness of breath, headache, visual changes, weakness, fever, back pain, dizziness, or syncope.  Patient denies anything makes her symptoms better.  Patient, states nothing seems to make her condition worse. Past Medical History  Diagnosis Date  . PANCREATITIS 05/09/2008  . NEUROPATHY, DIABETIC 02/23/2007  . Cyclic vomiting syndrome   . Hypertension   . Complication of anesthesia     "problems waking up"  . Heart murmur   . Myocardial infarction 07/2007    "they say I've had a silent one; I don't know"  . Pneumonia   . Type II diabetes mellitus   . Anemia   . Blood transfusion   . GERD (gastroesophageal reflux disease)   . Anxiety   . Depression   . Diabetic gastroparesis     /e-chart    Past Surgical History  Procedure Laterality Date  . Port-a-cath placement  ~ 2008    right chest; "poor access; frequent hospitalizations"  . Laparotomy and lysis of adhesions    . Abdominal hysterectomy  02/2002  . Cholecystectomy  1980's  . Cardiac catheterization    . Dilation and evacuation  X3  . Left hand surgery    . Cataract extraction w/ intraocular lens  implant, bilateral    . Colonoscopy    . Upper gi endoscopy      Family History  Problem Relation Age of Onset  . Diabetes type II Mother   . Diabetes Mother   . Hypertension Mother   . Diabetes type II Sister   . Diabetes Sister   . Hypertension Sister   . Hypertension Brother      History  Substance Use Topics  . Smoking status: Current Some Day Smoker -- 0.50 packs/day for 22 years    Types: Cigarettes    Last Attempt to Quit: 02/22/2012  . Smokeless tobacco: Never Used  . Alcohol Use: No    OB History   Grav Para Term Preterm Abortions TAB SAB Ect Mult Living                  Review of Systems All other systems negative except as documented in the HPI. All pertinent positives and negatives as reviewed in the HPI.  Allergies  Erythromycin; Acetaminophen; and Oxycodone-acetaminophen  Home Medications   Current Outpatient Rx  Name  Route  Sig  Dispense  Refill  . aspirin EC 81 MG tablet   Oral   Take 81 mg by mouth daily.         . carvedilol (COREG) 25 MG tablet   Oral   Take 1 tablet (25 mg total) by mouth 2 (two) times daily with a meal.   30 tablet   11   . cyclobenzaprine (FLEXERIL) 10 MG tablet   Oral   Take 1 tablet (10 mg total) by mouth 3 (three) times daily as needed for muscle spasms.   30 tablet   0   . diclofenac sodium (VOLTAREN) 1 % GEL  Topical   Apply 1 application topically 4 (four) times daily.   100 g   3   . EPINEPHrine (EPIPEN) 0.3 MG/0.3ML DEVI   Intramuscular   Inject 0.3 mg into the muscle once. as needed for severe anaphylaxis. (bee stings)         . insulin aspart (NOVOLOG) 100 UNIT/ML injection   Subcutaneous   Inject 4-8 Units into the skin 3 (three) times daily before meals. Sliding scale         . insulin glargine (LANTUS) 100 UNIT/ML injection   Subcutaneous   Inject 40 Units into the skin daily.   10 mL   2   . Insulin Syringe-Needle U-100 (PRODIGY INSULIN SYRINGE) 31G X 5/16" 0.3 ML MISC      For Lantus   100 each   6   . lisinopril (PRINIVIL,ZESTRIL) 40 MG tablet   Oral   Take 1 tablet (40 mg total) by mouth daily.   30 tablet   11   . Loratadine 10 MG CAPS   Oral   Take 1 capsule (10 mg total) by mouth daily.   30 each   5   . LORazepam (ATIVAN) 1 MG tablet   Oral    Take 1 mg by mouth 2 (two) times daily as needed for anxiety.         Marland Kitchen omeprazole (PRILOSEC) 20 MG capsule   Oral   Take 20 mg by mouth daily as needed. For acid reflux         . ondansetron (ZOFRAN) 4 MG tablet   Oral   Take 1 tablet (4 mg total) by mouth every 8 (eight) hours as needed for nausea.   20 tablet   0   . Oxycodone HCl 10 MG TABS   Oral   Take 1 tablet (10 mg total) by mouth 3 (three) times daily as needed. For pain.   90 tablet   0     Fill 12/23   . promethazine (PHENERGAN) 25 MG tablet   Oral   Take 1 tablet (25 mg total) by mouth every 6 (six) hours as needed for nausea.   30 tablet   0   . senna (SENOKOT) 8.6 MG TABS   Oral   Take 2 tablets (17.2 mg total) by mouth 2 (two) times daily. For constipation   60 each   3   . sucralfate (CARAFATE) 1 G tablet      Take 1 tablet (1 g total) by mouth 4 (four) times daily with meals & at bedtime   120 tablet   2     Refill [id number]     BP 168/96  Pulse 90  Temp(Src) 98.5 F (36.9 C) (Oral)  Resp 16  SpO2 100%  Physical Exam  Nursing note and vitals reviewed. Constitutional: She is oriented to person, place, and time. She appears well-developed and well-nourished. She appears distressed.  HENT:  Head: Normocephalic and atraumatic.  Mouth/Throat: Oropharynx is clear and moist.  Eyes: Pupils are equal, round, and reactive to light.  Neck: Normal range of motion. Neck supple.  Cardiovascular: Normal rate and regular rhythm.  Exam reveals no gallop and no friction rub.   No murmur heard. Pulmonary/Chest: Effort normal and breath sounds normal. No respiratory distress.  Abdominal: Soft. Normal appearance and bowel sounds are normal. There is generalized tenderness. There is no rigidity, no rebound, no guarding and no CVA tenderness. No hernia.  Neurological: She is alert and oriented to  person, place, and time. She exhibits normal muscle tone. Coordination normal.  Skin: Skin is warm and dry. No  rash noted.  Psychiatric: She has a normal mood and affect. Her behavior is normal. Judgment and thought content normal.    ED Course  Procedures (including critical care time)  Labs Reviewed  BASIC METABOLIC PANEL - Abnormal; Notable for the following:    Glucose, Bld 446 (*)    BUN 31 (*)    Creatinine, Ser 1.84 (*)    GFR calc non Af Amer 31 (*)    GFR calc Af Amer 36 (*)    All other components within normal limits  CBC WITH DIFFERENTIAL - Abnormal; Notable for the following:    RBC 3.82 (*)    HCT 33.6 (*)    MCHC 36.3 (*)    All other components within normal limits  URINALYSIS, ROUTINE W REFLEX MICROSCOPIC   No results found.   1. Cyclic vomiting syndrome    I spoke with the family practice resident and they will be down to admit the patient to the hospital   MDM  MDM Reviewed: nursing note and vitals Interpretation: labs Consults: admitting MD            Brent General, PA-C 03/02/13 1624

## 2013-03-02 NOTE — Progress Notes (Signed)
PCP visit note: This is a patient of mine with a history of significant cyclical vomiting with previous hospitalizations on chronic Ativan, Phenergan, and narcotics for maintenance therapy.  She is also a chronic marijuana user.  She reports that her symptoms started today. She last used marijuana yesterday but it was how much she normally uses. A family member had vomiting symptoms a few days ago, and she is concerned her exacerbation or symptoms may be due to her catching this infection.   While in the room, she had an episode of vomiting red-tinged mucous.   A/P: She is requesting GI cocktail, so I will place order for it now.  I appreciate the excellent care being provided by the [facility] and have spoken with them regarding this patient.

## 2013-03-02 NOTE — ED Notes (Signed)
Hung 2nd 1051ml NS bag.

## 2013-03-02 NOTE — ED Notes (Signed)
To ED for eval of n/v. Pt has hx of diabetes but not taking insulin per ems report

## 2013-03-02 NOTE — H&P (Signed)
[name] [name] Service Pager: [phone]  Patient name: [name] [name] Medical record number: [id number] Date of birth: 01-11-1966 Age: 47 y.o. Gender: female  Primary Care Provider: [facility], [name], MD  Chief Complaint: nausea and vomiting   Assessment and Plan: [name] [name] is a 47 y.o. year old female presenting with cyclic vomiting with possible hematemesis   1. Cyclic Vomiting - Pt with extensive history of this in the past needing admission for control of symptoms. Also possible viral GI illness.  1. Have correlated this to marijuana use in the past.  Recently smoked heavy amount yesterday 2. Will start her routine meds including Dilaudid, Ativan, Zofran, and Phenergan , Protonix all IV 3. NPO and advance diet as tolerated. NS @ 125 cc/hr 4. Consult to CSW for substance abuse  5. Also will get Gastroccult to check for possible blood in her vomit.  Hgb 12.0 on admission (baseline 11-12), will repeat in 6 hrs. Holding ASA for now as well. Continue IV protonix for possible GI bleed. Consider UDS for cocaine use of gastrocult positive (ddx includes hemoptysis) 6. Will check Lipase as well  2.  Hypertension - Will hold lisinopril in setting of possible AKI.  Will continue home Coreg 3. CKD Stage III to IV - Creatinine stable around 1.9 (baseline 1.7-1.9 over last 3 months) 1. Repeat BMP in AM 2. NS @ 125 cc/hr  4. Marijuana Abuse - Consult to CSW 5. FEN/GI: NPO, Protonix, NS @ 125 cc/hr  6. Prophylaxis: SCD, if gastroccult negative start heparin SQ 7. Disposition: Pending clinical improvement 8. Code Status: Full   History of Present Illness: [name] [name] is a 47 y.o. year old female presenting with 1 day history of Nausea and Vomiting. She has a history significant for cyclic vomiting syndrome. She reports 1 day ago she was in her normal state of health and woke up this morning when she started to become nauseated and had a  few episodes of red chunked sputum.  She did eat pizza last night and drank red Gatorade both today and yesterday evening. Severe episodic abdominal pain that is similar to her normal pain and is unable to tolerate anything PO at this point.  Patient has been around her granddaughter who has had a GI illness within last week. Denies fever.   In the ED, pt had CBC done showing Hgb of 12.2, BMP with creatinine 1.84 and glucose 446.  She was given one dose of protonix and started on NS @ 100 cc/hr.   Of note Pt does have prior association with smoking marijuana and having cyclic vomiting. She reports last having smoked a heavy amount of marijuana yesterday prior to having these Sx begin.  Pt denies blurred vision, CP, SOB, diplopia, weakness, diarrhea, constipation.     Patient Active Problem List  Diagnosis  . DEPRESSION, MAJOR, RECURRENT  . TOBACCO DEPENDENCE  . MIGRAINE, UNSPEC., W/O INTRACTABLE MIGRAINE  . HYPERTENSION, BENIGN SYSTEMIC  . GERD  . BACK PAIN, CHRONIC  . INSOMNIA NOS  . Hot flashes  . Cyclic vomiting syndrome  . Uncontrolled secondary diabetes mellitus with stage 3 CKD (GFR 30-59)  . Marijuana smoker  . Allergic rhinitis, seasonal  . High risk social situation  . Preventative health care  . Esophageal spasm   Past Medical History: Past Medical History  Diagnosis Date  . PANCREATITIS 05/09/2008  . NEUROPATHY, DIABETIC 02/23/2007  . Cyclic vomiting syndrome   . Hypertension   .  Complication of anesthesia     "problems waking up"  . Heart murmur   . Myocardial infarction 07/2007    "they say I've had a silent one; I don't know"  . Pneumonia   . Type II diabetes mellitus   . Anemia   . Blood transfusion   . GERD (gastroesophageal reflux disease)   . Anxiety   . Depression   . Diabetic gastroparesis     /e-chart   Past Surgical History: Past Surgical History  Procedure Laterality Date  . Port-a-cath placement  ~ 2008    right chest; "poor access; frequent  hospitalizations"  . Laparotomy and lysis of adhesions    . Abdominal hysterectomy  02/2002  . Cholecystectomy  1980's  . Cardiac catheterization    . Dilation and evacuation  X3  . Left hand surgery    . Cataract extraction w/ intraocular lens  implant, bilateral    . Colonoscopy    . Upper gi endoscopy     Social History: History  Substance Use Topics  . Smoking status: Current Some Day Smoker -- 0.50 packs/day for 22 years    Types: Cigarettes    Last Attempt to Quit: 02/22/2012  . Smokeless tobacco: Never Used  . Alcohol Use: No   For any additional social history documentation, please refer to relevant sections of EMR.  Family History: Family History  Problem Relation Age of Onset  . Diabetes type II Mother   . Diabetes Mother   . Hypertension Mother   . Diabetes type II Sister   . Diabetes Sister   . Hypertension Sister   . Hypertension Brother    Allergies: Allergies  Allergen Reactions  . Erythromycin Nausea And Vomiting  . Acetaminophen Nausea And Vomiting  . Oxycodone-Acetaminophen Nausea Only    Only intolerant to APAP component    No current facility-administered medications on file prior to encounter.   Current Outpatient Prescriptions on File Prior to Encounter  Medication Sig Dispense Refill  . aspirin EC 81 MG tablet Take 81 mg by mouth daily.      . carvedilol (COREG) 25 MG tablet Take 1 tablet (25 mg total) by mouth 2 (two) times daily with a meal.  30 tablet  11  . cyclobenzaprine (FLEXERIL) 10 MG tablet Take 1 tablet (10 mg total) by mouth 3 (three) times daily as needed for muscle spasms.  30 tablet  0  . diclofenac sodium (VOLTAREN) 1 % GEL Apply 1 application topically 4 (four) times daily.  100 g  3  . EPINEPHrine (EPIPEN) 0.3 MG/0.3ML DEVI Inject 0.3 mg into the muscle once. as needed for severe anaphylaxis. (bee stings)      . insulin aspart (NOVOLOG) 100 UNIT/ML injection Inject 4-8 Units into the skin 3 (three) times daily before meals.  Sliding scale      . insulin glargine (LANTUS) 100 UNIT/ML injection Inject 40 Units into the skin daily.  10 mL  2  . Insulin Syringe-Needle U-100 (PRODIGY INSULIN SYRINGE) 31G X 5/16" 0.3 ML MISC For Lantus  100 each  6  . lisinopril (PRINIVIL,ZESTRIL) 40 MG tablet Take 1 tablet (40 mg total) by mouth daily.  30 tablet  11  . Loratadine 10 MG CAPS Take 1 capsule (10 mg total) by mouth daily.  30 each  5  . LORazepam (ATIVAN) 1 MG tablet Take 1 mg by mouth 2 (two) times daily as needed for anxiety.      Marland Kitchen omeprazole (PRILOSEC) 20 MG capsule  Take 20 mg by mouth daily as needed. For acid reflux      . ondansetron (ZOFRAN) 4 MG tablet Take 1 tablet (4 mg total) by mouth every 8 (eight) hours as needed for nausea.  20 tablet  0  . Oxycodone HCl 10 MG TABS Take 1 tablet (10 mg total) by mouth 3 (three) times daily as needed. For pain.  90 tablet  0  . promethazine (PHENERGAN) 25 MG tablet Take 1 tablet (25 mg total) by mouth every 6 (six) hours as needed for nausea.  30 tablet  0  . senna (SENOKOT) 8.6 MG TABS Take 2 tablets (17.2 mg total) by mouth 2 (two) times daily. For constipation  60 each  3  . sucralfate (CARAFATE) 1 G tablet Take 1 tablet (1 g total) by mouth 4 (four) times daily with meals & at bedtime  120 tablet  2   Review Of Systems: Per HPI with the following additions: None Otherwise 12 point review of systems was performed and was unremarkable.  [name] Exam: BP 168/96  Pulse 90  Temp(Src) 98.5 F (36.9 C) (Oral)  Resp 16  SpO2 100% Exam: General: Mild distress, actively vomiting  HEENT: [place], AT, dry mucus membranes  Cardiovascular: rrr, no mumurs, rubs, or gallops  Respiratory: CTAB, no wheezes or rhonchi  Abdomen: soft, tender to palpation in epigastric region, ND, +BS. No rebound or guarding.  Extremities: no edema  Skin: No lesions visualized  Neuro: Grossly normal   Labs and Imaging: CBC BMET   Recent Labs Lab 03/02/13 1214  WBC 8.9  HGB 12.2  HCT 33.6*   PLT 210    Recent Labs Lab 03/02/13 1214  NA 136  K 3.7  CL 97  CO2 25  BUN 31*  CREATININE 1.84*  GLUCOSE 446*  CALCIUM 10.5     Bryan R. Hess, DO of [name] 03/02/2013, 4:25 PM  Family Medicine Upper Level Addendum:   I have seen and examined the patient independently, discussed with Dr. Awanda Mink, fully reviewed the H+P and agree with it's contents with the additions as noted in blue text.  Garret Reddish, MD, PGY2 03/02/2013 5:03 PM

## 2013-03-02 NOTE — ED Notes (Signed)
Insulin dose verified with Emeterio Reeve

## 2013-03-02 NOTE — Progress Notes (Signed)
Physician on call made aware of pt's elevated BP and HR. Orders given and patient informed. Will continue to monitor.

## 2013-03-03 ENCOUNTER — Encounter (HOSPITAL_COMMUNITY): Payer: Self-pay | Admitting: *Deleted

## 2013-03-03 DIAGNOSIS — I10 Essential (primary) hypertension: Secondary | ICD-10-CM | POA: Diagnosis not present

## 2013-03-03 DIAGNOSIS — R1115 Cyclical vomiting syndrome unrelated to migraine: Secondary | ICD-10-CM | POA: Diagnosis not present

## 2013-03-03 LAB — BASIC METABOLIC PANEL
BUN: 24 mg/dL — ABNORMAL HIGH (ref 6–23)
Calcium: 10.2 mg/dL (ref 8.4–10.5)
Creatinine, Ser: 1.51 mg/dL — ABNORMAL HIGH (ref 0.50–1.10)
GFR calc Af Amer: 46 mL/min — ABNORMAL LOW (ref >90)
GFR calc non Af Amer: 40 mL/min — ABNORMAL LOW (ref >90)

## 2013-03-03 LAB — CBC
MCHC: 35.9 g/dL (ref 30.0–36.0)
Platelets: 226 10*3/uL (ref 150–400)
RDW: 13.4 % (ref 11.5–15.5)
WBC: 10.5 10*3/uL (ref 4.0–10.5)

## 2013-03-03 LAB — GLUCOSE, CAPILLARY
Glucose-Capillary: 195 mg/dL — ABNORMAL HIGH (ref 70–99)
Glucose-Capillary: 195 mg/dL — ABNORMAL HIGH (ref 70–99)
Glucose-Capillary: 38 mg/dL — CL (ref 70–99)
Glucose-Capillary: 92 mg/dL (ref 70–99)

## 2013-03-03 MED ORDER — PROMETHAZINE HCL 25 MG/ML IJ SOLN
12.5000 mg | INTRAMUSCULAR | Status: DC | PRN
  Administered 2013-03-03 – 2013-03-05 (×4): 12.5 mg via INTRAVENOUS
  Filled 2013-03-03 (×4): qty 1

## 2013-03-03 MED ORDER — ALUM & MAG HYDROXIDE-SIMETH 200-200-20 MG/5ML PO SUSP
30.0000 mL | Freq: Four times a day (QID) | ORAL | Status: DC | PRN
  Administered 2013-03-03: 30 mL via ORAL
  Filled 2013-03-03: qty 30

## 2013-03-03 MED ORDER — LORAZEPAM 2 MG/ML IJ SOLN
2.0000 mg | Freq: Once | INTRAMUSCULAR | Status: AC
  Administered 2013-03-03: 2 mg via INTRAVENOUS
  Filled 2013-03-03: qty 1

## 2013-03-03 MED ORDER — HEPARIN SODIUM (PORCINE) 5000 UNIT/ML IJ SOLN
5000.0000 [IU] | Freq: Three times a day (TID) | INTRAMUSCULAR | Status: DC
  Administered 2013-03-03 – 2013-03-05 (×7): 5000 [IU] via SUBCUTANEOUS
  Filled 2013-03-03 (×9): qty 1

## 2013-03-03 MED ORDER — ONDANSETRON 8 MG PO TBDP
8.0000 mg | ORAL_TABLET | Freq: Once | ORAL | Status: AC
  Administered 2013-03-03: 8 mg via ORAL
  Filled 2013-03-03: qty 1

## 2013-03-03 MED ORDER — METOCLOPRAMIDE HCL 5 MG/ML IJ SOLN
10.0000 mg | Freq: Once | INTRAMUSCULAR | Status: AC
  Administered 2013-03-03: 10 mg via INTRAVENOUS
  Filled 2013-03-03: qty 2

## 2013-03-03 MED ORDER — HYDROMORPHONE BOLUS VIA INFUSION
1.0000 mg | Freq: Once | INTRAVENOUS | Status: DC
  Filled 2013-03-03: qty 1

## 2013-03-03 MED ORDER — GI COCKTAIL ~~LOC~~
30.0000 mL | Freq: Once | ORAL | Status: AC
  Administered 2013-03-03: 30 mL via ORAL
  Filled 2013-03-03: qty 30

## 2013-03-03 MED ORDER — HYDROMORPHONE HCL PF 1 MG/ML IJ SOLN
1.0000 mg | Freq: Once | INTRAMUSCULAR | Status: AC
  Administered 2013-03-03: 1 mg via INTRAVENOUS
  Filled 2013-03-03: qty 1

## 2013-03-03 NOTE — H&P (Signed)
Seen and examined.  Agree with Dr. Yong Channel - the admit, his documentation and management.  Briefly, Ms. Ellzey is very well known to [place].  She has cyclic vomiting syndrome and is at it again.  She was exposed to presumed viral GE from a grandchild 1 week ago - but that is the only twist on her typical story.  She was up all night.  Sedated now from a significant dose of meds - that is what it usually takes to break this cycle.  Will follow.

## 2013-03-03 NOTE — Progress Notes (Signed)
Seen and examined.  See my cosign of the H&PE for my note of today.  On the good side, she is not tearful during my visit - sleepy but not tearful.  Dr. Barbra Sarks did a nice job of responding to her distress.

## 2013-03-03 NOTE — ED Provider Notes (Signed)
Medical screening examination/treatment/procedure(s) were performed by non-physician practitioner and as supervising physician I was immediately available for consultation/collaboration  Ovid Curd R. Alvino Chapel, MD 03/03/13 [phone]

## 2013-03-03 NOTE — Progress Notes (Addendum)
[id number] 2200 pt blood sugar is 38/ pt is alert and oriented/ Per pt she is not having any symptoms/Hypoglycemic protocol started/Pt was able to drink liquids/ rechecked bs at 2218 and bs 92. Dr Melrose Nakayama notified.Johny Shock RN

## 2013-03-03 NOTE — Progress Notes (Signed)
Utilization Review Completed.   Francena Zender, RN, BSN Nurse Case Manager  [phone]  

## 2013-03-03 NOTE — Progress Notes (Signed)
Family Medicine Teaching Service Daily Progress Note Service Pager: [phone]  Subjective: Patient is tearful this morning, saying she wants to feel better and if Dr. McDiarmid were here she would already have gotten her medication cocktail and she would feel better.  Objective: Vital signs in last 24 hours: Filed Vitals:   03/03/13 0225 03/03/13 0321 03/03/13 0459 03/03/13 0642  BP: 200/100 190/90 178/96   Pulse:    118  Temp:    98.3 F (36.8 C)  TempSrc:    Oral  Resp:    20  Height:      Weight:      SpO2:    100%   Weight change:   Intake/Output Summary (Last 24 hours) at 03/03/13 0738 Last data filed at 03/03/13 0622  Gross per 24 hour  Intake 1483.33 ml  Output    450 ml  Net 1033.33 ml   General: Mild distress, tearful HEENT: [place], AT, mucous membranes moist.  Cardiovascular: rrr, no mumurs, rubs, or gallops  Respiratory: CTAB, no wheezes or rhonchi  Abdomen: soft, tender to palpation in epigastric region, ND, +BS. No rebound or guarding.  Extremities: no edema  Skin: No lesions visualized  Neuro: Grossly normal  Lab Results:  Recent Labs  03/02/13 2228 03/03/13 0652  WBC 9.6 10.5  HGB 12.0 12.0  HCT 33.0* 33.4*  PLT 221 226    Recent Labs  03/02/13 1214 03/03/13 0652  NA 136 146*  K 3.7 3.7  CL 97 108  CO2 25 24  GLUCOSE 446* 257*  BUN 31* 24*  CREATININE 1.84* 1.51*  CALCIUM 10.5 10.2   Micro Results: No results found for this or any previous visit (from the past 240 hour(s)). Studies/Results:  Medications: I have reviewed the patient's current medications. Scheduled Meds: . carvedilol  25 mg Oral BID WC  . EPINEPHrine  0.3 mg Intramuscular Once  . insulin aspart  0-15 Units Subcutaneous TID WC  . insulin glargine  40 Units Subcutaneous QHS  . lisinopril  40 mg Oral Daily  . pantoprazole (PROTONIX) IV  40 mg Intravenous Q24H   Continuous Infusions: . sodium chloride 125 mL/hr at 03/02/13 1844   PRN Meds:.alum & mag  hydroxide-simeth, hydrALAZINE, HYDROmorphone (DILAUDID) injection, LORazepam, ondansetron (ZOFRAN) IV, promethazine Assessment/Plan: MEGANN BARTUNEK is a 47 y.o. female patient, Hospital day 1 for cyclic vomiting:   1. Cyclic Vomiting - Pt with extensive history of this in the past needing admission for control of symptoms. Also possible viral GI illness.  1. Have correlated this to marijuana use in the past. Patient becomes angry when this is brought up.  2. Will give "cocktail:" of Dilaudid, ativan, reglan, and zofran all at once, then continue Dilaudid, Ativan, Zofran, and Phenergan, Protonix PRN 3. NPO and advance diet as tolerated. NS @ 125 cc/hr 4. Consult to CSW for substance abuse  5. ? Hematemesis: No further red color in vomit 2. Hypertension - Will hold lisinopril in setting of possible AKI. Will continue home Coreg, hydralazine PRN 3. DM: Will continue low dose of Lantus and SSI while NPO, will need to increase when PO intake improves.  4. CKD Stage III to IV - Creatinine stable around 1.9 (baseline 1.7-1.9 over last 3 months), improved this morning with hydration 1. NS @ 125 cc/hr until PO intake improves.  5. Marijuana Abuse - Consult to CSW 6. FEN/GI: NPO, Protonix, NS @ 125 cc/hr  7. Prophylaxis: will start SQ heparin in setting of no more red color  in vomit.  8. Disposition: Pending clinical improvement 9. Code Status: Full     LOS: 1 day   CHAMBERLAIN,RACHEL 03/03/2013, 7:38 AM

## 2013-03-04 DIAGNOSIS — E1329 Other specified diabetes mellitus with other diabetic kidney complication: Secondary | ICD-10-CM | POA: Diagnosis not present

## 2013-03-04 LAB — GLUCOSE, CAPILLARY
Glucose-Capillary: 153 mg/dL — ABNORMAL HIGH (ref 70–99)
Glucose-Capillary: 95 mg/dL (ref 70–99)

## 2013-03-04 MED ORDER — GI COCKTAIL ~~LOC~~
30.0000 mL | Freq: Three times a day (TID) | ORAL | Status: DC | PRN
  Administered 2013-03-04: 30 mL via ORAL
  Filled 2013-03-04 (×2): qty 30

## 2013-03-04 MED ORDER — PANTOPRAZOLE SODIUM 20 MG PO TBEC
20.0000 mg | DELAYED_RELEASE_TABLET | Freq: Every day | ORAL | Status: DC
  Administered 2013-03-04: 20 mg via ORAL
  Filled 2013-03-04 (×2): qty 1

## 2013-03-04 NOTE — Progress Notes (Addendum)
Family Medicine Teaching Service Daily Progress Note   Subjective: Interval History: has complaints nausea with orange juice this AM and feels set back due to this. Patient also requests to advance diet.   Overnight patient had and episode of hypoglycemia down to 38, asymptomatic, blood sugar improved with PO intake.   Objective: Vital signs in last 24 hours: Temp:  [98.4 F (36.9 C)-98.7 F (37.1 C)] 98.4 F (36.9 C) (03/08 2200) Pulse Rate:  [66-101] 66 (03/08 2200) Resp:  [12-16] 16 (03/08 2200) BP: (111-149)/(68-92) 149/68 mmHg (03/08 2200) SpO2:  [99 %-100 %] 99 % (03/08 2200)  General: NAD HEENT: [place], AT, mucous membranes moist.  Cardiovascular: rrr, no mumurs, rubs, or gallops  Respiratory: CTAB, no wheezes or rhonchi  Abdomen: soft, tender to palpation in epigastric region, ND, +BS. No rebound or guarding.  Extremities: no edema  Skin: No lesions visualized  Neuro: Grossly normal  Pertinent Labs:  CBG (last 3)   Recent Labs  03/03/13 2155 03/03/13 2218 03/04/13 0233  GLUCAP 38* 92 153*   Studies/Results: .resul  Meds: reviewed all scheduled and prn medications.   Assessment/Plan:  NAISHA GATRELL is a 47 y.o. female patient, Hospital day 2 for cyclic vomiting:  1. Cyclic Vomiting - marijuana vs. GI illness inducing.  1. Continue prn  Dilaudid, zofran, phenergan, ativan 2. Will plan to continue liquid clears until d/c 3. NS @ 125 cc/hr-->75/hr 4. Consult to CSW for substance abuse  5. ? Hematemesis: No further red color in vomit 2. Hypertension -  Moderate control. Will continue home Coreg, hydralazine PRN. BMET in AM and if Cr stable, restart lisinopril.  3. DM: hypoglycemic last night due to poor PO. Holding lantus now. May restart 1/2 dose if PO improves.   4. CKD Stage III to IV - Creatinine stable around 1.9 (baseline 1.7-1.9 over last 3 months). BMET in AM 5. FEN/GI: NPO, Protonix change to PO, NS @ 75 cc/hr  6. Prophylaxis: sq hep.    7. Disposition: Pending clinical improvement. Hopeful for d/c tomorrow if tolerates liquid clears today.  8. Code Status: Full    LOS: 2 days   Brayton Mars. Melanee Spry, MD, PGY2 03/04/2013 2:00 PM Beeper [phone]  Seen and examined.  Patient is feeling some better.  I tweaked her meds and advanced her diet per patient request.  "I'm going home tomorrow."  I hope she is prophetic.

## 2013-03-05 LAB — GLUCOSE, CAPILLARY
Glucose-Capillary: 138 mg/dL — ABNORMAL HIGH (ref 70–99)
Glucose-Capillary: 119 mg/dL — ABNORMAL HIGH (ref 70–99)

## 2013-03-05 MED ORDER — PROMETHAZINE HCL 25 MG PO TABS: 25.0000 mg | ORAL_TABLET | Freq: Four times a day (QID) | ORAL | Status: DC | PRN

## 2013-03-05 MED ORDER — PANTOPRAZOLE SODIUM 40 MG PO TBEC
40.0000 mg | DELAYED_RELEASE_TABLET | Freq: Every day | ORAL | Status: DC
  Administered 2013-03-05: 40 mg via ORAL
  Filled 2013-03-05: qty 1

## 2013-03-05 MED ORDER — ONDANSETRON HCL 4 MG PO TABS: 4.0000 mg | ORAL_TABLET | Freq: Three times a day (TID) | ORAL | Status: DC | PRN

## 2013-03-05 MED ORDER — HEPARIN SOD (PORK) LOCK FLUSH 100 UNIT/ML IV SOLN
500.0000 [IU] | INTRAVENOUS | Status: DC | PRN
  Administered 2013-03-05: 500 [IU]
  Filled 2013-03-05: qty 5

## 2013-03-05 MED ORDER — INSULIN GLARGINE 100 UNIT/ML ~~LOC~~ SOLN: 20.0000 [IU] | Freq: Every day | SUBCUTANEOUS | Status: DC

## 2013-03-05 MED ORDER — HEPARIN SOD (PORK) LOCK FLUSH 100 UNIT/ML IV SOLN
500.0000 [IU] | INTRAVENOUS | Status: DC
  Filled 2013-03-05: qty 5

## 2013-03-05 NOTE — Discharge Summary (Signed)
[facility] Discharge Summary  Patient name: [name] [name] Medical record number: [id number] Date of birth: Mar 11, 1966 Age: 47 y.o. Gender: female Date of Admission: 03/02/2013  Date of Discharge: 03/05/13 Admitting Physician: Zigmund Gottron, MD  Primary Care Provider: [facility], [name], MD  Indication for Hospitalization: Nausea/vomiting - intractable  Discharge Diagnoses:  Cyclic vomiting syndrome HTN, uncontrolled DM-2 CKD stage III - IV  Brief Hospital Course:  47 year old female with a PMH of HTN, Diabetes, CKD, and cyclic vomiting syndrome presented with cyclic vomiting and possible hematemesis.  1) Cyclic vomiting syndrome - Has had numerous admission for this in the past.  Typically brought on by marijuana use which patient endorsed on admission. - Improved following treatment with IV fluids, IV Dilaudid, Ativan, Zofran, Phenergan, Protonix, and GI cocktails. - Hb remained stable during admission with no evidence of GI bleeding.   - Patient was well appearing and nausea/vomiting were resolved at time of discharge.  2) HTN, uncontrolled - Treated with Lisinopril, Coreg, and PRN Hydralazine. - BP's remained elevated and difficult to control during admission.  3) DM-2 - Patient was initially treated with Lantus and SSI.  Lantus was later held in the setting of hypoglycemia on 3/8. - Lantus was decreased upon discharged given well controlled CBG's on SSI.  4) CKD stage III-IV - Creatinine was stable around 1.9 (baseline 1.7-1.9 over last 3 months)  Significant Labs and Imaging:   CBC BMET   Recent Labs Lab 03/02/13 1214 03/02/13 2228 03/03/13 0652  WBC 8.9 9.6 10.5  HGB 12.2 12.0 12.0  HCT 33.6* 33.0* 33.4*  PLT 210 221 226    Recent Labs Lab 03/02/13 1214 03/03/13 0652  NA 136 146*  K 3.7 3.7  CL 97 108  CO2 25 24  BUN 31* 24*  CREATININE 1.84* 1.51*  GLUCOSE 446* 257*  CALCIUM 10.5 10.2     Procedures:  None  Consultations: None  Discharge Medications:    Medication List    TAKE these medications       aspirin EC 81 MG tablet  Take 81 mg by mouth daily.     carvedilol 25 MG tablet  Commonly known as:  COREG  Take 1 tablet (25 mg total) by mouth 2 (two) times daily with a meal.     cyclobenzaprine 10 MG tablet  Commonly known as:  FLEXERIL  Take 1 tablet (10 mg total) by mouth 3 (three) times daily as needed for muscle spasms.     EPIPEN 0.3 mg/0.3 mL Devi  Generic drug:  EPINEPHrine  Inject 0.3 mg into the muscle once. as needed for severe anaphylaxis. (bee stings)     insulin aspart 100 UNIT/ML injection  Commonly known as:  novoLOG  Inject 4-8 Units into the skin 3 (three) times daily before meals. Sliding scale     insulin glargine 100 UNIT/ML injection  Commonly known as:  LANTUS  Inject 20 Units into the skin daily.     Insulin Syringe-Needle U-100 31G X 5/16" 0.3 ML Misc  Commonly known as:  PRODIGY INSULIN SYRINGE  For Lantus     lisinopril 40 MG tablet  Commonly known as:  PRINIVIL,ZESTRIL  Take 1 tablet (40 mg total) by mouth daily.     LORazepam 1 MG tablet  Commonly known as:  ATIVAN  Take 1 mg by mouth 2 (two) times daily as needed for anxiety.     ondansetron 4 MG tablet  Commonly known as:  ZOFRAN  Take 1 tablet (4 mg total) by mouth every 8 (eight) hours as needed for nausea.     Oxycodone HCl 10 MG Tabs  Take 1 tablet (10 mg total) by mouth 3 (three) times daily as needed. For pain.     promethazine 25 MG tablet  Commonly known as:  PHENERGAN  Take 1 tablet (25 mg total) by mouth every 6 (six) hours as needed for nausea.       Issues for Follow Up:  1) Continued resolution of nausea/vomiting 2) Education about need to quit using marijuana 3) HTN - Please readdress as BP was very difficult to control during admission.  Outstanding Results: None  Discharge Instructions: Patient was counseled important signs and symptoms that should  prompt return to medical care, changes in medications, dietary instructions, activity restrictions, and follow up appointments.    Follow-up Information   Follow up with [facility], [name], MD On 03/14/2013. (1:45 pm)    Contact information:   [place] [phone]       Discharge Condition: Stable. Discharged home.  Thersa Salt, DO 03/05/2013, 8:42 PM

## 2013-03-05 NOTE — Clinical Social Work Note (Signed)
Clinical Social Worker attempted to speak to patient regarding substance abuse, but patient already discharged before CSW could assess.   Leandro Reasoner MSW, [place]

## 2013-03-05 NOTE — Progress Notes (Signed)
Family Medicine Teaching Service Daily Progress Note Service Page: [phone]  Subjective:  Feeling well this am. Does report some mild abdominal discomfort.  No recent nausea/vomiting.    Objective: Temp:  [98.5 F (36.9 C)-99 F (37.2 C)] 99 F (37.2 C) (03/09 2204) Pulse Rate:  [66-72] 66 (03/09 2204) Resp:  [18] 18 (03/09 2204) BP: (144-146)/(62-75) 146/69 mmHg (03/09 2204) SpO2:  [98 %-100 %] 100 % (03/09 2204) Weight:  [158 lb 8.2 oz (71.9 kg)] 158 lb 8.2 oz (71.9 kg) (03/09 0700)  Exam: General: well appearing. NAD. Cardiovascular: RRR. No murmurs, rubs, or gallops. Respiratory: CTAB. No rales, rhonchi, or wheezing. Abdomen: soft, mildly tender to palpation (diffusely), nondistended. Extremities: warm, well perfused.  CBC BMET   Recent Labs Lab 03/02/13 1214 03/02/13 2228 03/03/13 0652  WBC 8.9 9.6 10.5  HGB 12.2 12.0 12.0  HCT 33.6* 33.0* 33.4*  PLT 210 221 226    Recent Labs Lab 03/02/13 1214 03/03/13 0652  NA 136 146*  K 3.7 3.7  CL 97 108  CO2 25 24  BUN 31* 24*  CREATININE 1.84* 1.51*  GLUCOSE 446* 257*  CALCIUM 10.5 10.2     Scheduled Meds: . carvedilol  25 mg Oral BID WC  . EPINEPHrine  0.3 mg Intramuscular Once  . heparin subcutaneous  5,000 Units Subcutaneous Q8H  . insulin aspart  0-15 Units Subcutaneous TID WC  . lisinopril  40 mg Oral Daily  . pantoprazole  40 mg Oral Daily   PRN Meds: gi cocktail, hydrALAZINE, HYDROmorphone (DILAUDID) injection, LORazepam, promethazine  Assessment/Plan: 47 year old female with a PMH of HTN, Diabetes, CKD, and cyclic vomiting syndrome presents with cyclic vomiting and possible hematemesis.  # Cyclic vomiting with possible hematemesis - vomiting correlates with marijuana use as patient has a long standing history of cyclic vomiting. - Vomiting now resolved. - Will continue Dilaudid 0.5 Q2 PRN, Ativan 0.5 Q4 PRN, PPI, and Phenergan 12.5 Q4 PRN while in house. - Also continuing GI cocktail TID PRN -  Hb stable at 12. - Planned Discharge home today   # HTN, uncontrolled. - Will continue Coreg 25 mg BID, Lisinopril 40 mg, and PRN Hydralazine 15 mg   # DM-2 - Will continue Moderate SSI while in house.  # CKD Stage III to IV - Creatinine stable around 1.9 (baseline 1.7-1.9 over last 3 months),  - Stable.   FEN/GI: Carb modified Prophylaxis: SQ Heparin Disposition: Planned D/C home today. Code Status: Full   Thersa Salt, DO 03/05/2013, 6:48 AM

## 2013-03-05 NOTE — Progress Notes (Signed)
FMTS attending Note Patient's care discussed in detail with resident team, and I agree with plan for discharge when patient is able to tolerate orals.  Dalbert Mayotte, MD

## 2013-03-05 NOTE — Progress Notes (Signed)
Discharge instructions reviewed with pt and prescriptions given.  Pt verbalized understanding and had no questions.  Pt discharged in stable condition.  Eliezer Bottom [place]

## 2013-03-06 NOTE — Discharge Summary (Signed)
Case discussed with resident team on day of discharge, I agree with plan for discharge to home.  Dalbert Mayotte, MD

## 2013-03-09 ENCOUNTER — Telehealth: Payer: Self-pay | Admitting: *Deleted

## 2013-03-09 NOTE — Telephone Encounter (Signed)
Ellison Hughs calling from [facility].  Need to verify quantity/dosage of Carvedilol.  Directions are one tablet BID with meals, but quantity is for #30.  Will route to Dr. Verdie Drown for clarification and call pharmacy back.  Nolene Ebbs, RN

## 2013-03-10 MED ORDER — CARVEDILOL 25 MG PO TABS: 25.0000 mg | ORAL_TABLET | Freq: Two times a day (BID) | ORAL | Status: DC

## 2013-03-10 NOTE — Telephone Encounter (Signed)
Tried calling number but cannot be completed as dialed.  Rx sent to [facility] for Coreg 25 bid # 60.

## 2013-03-14 ENCOUNTER — Ambulatory Visit (INDEPENDENT_AMBULATORY_CARE_PROVIDER_SITE_OTHER): Payer: Medicare Other | Admitting: Family Medicine

## 2013-03-14 ENCOUNTER — Encounter: Payer: Self-pay | Admitting: Family Medicine

## 2013-03-14 VITALS — BP 166/81 | HR 81 | Temp 98.9°F | Ht 61.0 in | Wt 160.2 lb

## 2013-03-14 DIAGNOSIS — R1115 Cyclical vomiting syndrome unrelated to migraine: Secondary | ICD-10-CM | POA: Diagnosis not present

## 2013-03-14 DIAGNOSIS — E1329 Other specified diabetes mellitus with other diabetic kidney complication: Secondary | ICD-10-CM | POA: Diagnosis not present

## 2013-03-14 DIAGNOSIS — N183 Chronic kidney disease, stage 3 unspecified: Secondary | ICD-10-CM | POA: Diagnosis not present

## 2013-03-14 MED ORDER — DICLOFENAC SODIUM 1 % TD GEL: 2.0000 g | Freq: Four times a day (QID) | TRANSDERMAL | Status: DC

## 2013-03-14 MED ORDER — FREESTYLE SYSTEM KIT: 1.0000 | PACK | Status: DC | PRN

## 2013-03-14 MED ORDER — OXYCODONE HCL 10 MG PO TABS: 10.0000 mg | ORAL_TABLET | Freq: Three times a day (TID) | ORAL | Status: DC | PRN

## 2013-03-14 MED ORDER — VENLAFAXINE HCL 37.5 MG PO TABS: 37.5000 mg | ORAL_TABLET | Freq: Two times a day (BID) | ORAL | Status: DC

## 2013-03-14 MED ORDER — LORAZEPAM 1 MG PO TABS: 1.0000 mg | ORAL_TABLET | Freq: Two times a day (BID) | ORAL | Status: DC | PRN

## 2013-03-14 MED ORDER — EPINEPHRINE HCL 1 MG/ML IJ SOLN: 1.0000 mg | Freq: Once | INTRAMUSCULAR | Status: DC

## 2013-03-14 NOTE — Progress Notes (Signed)
  Subjective:    Patient ID: [name] [name], female    DOB: 1966-01-17, 47 y.o.   MRN: [id number]  HPI # Hospital follow-up for cyclical vomiting exacerbation  She was sick yesterday, went to a quiet room, and rested.   She has not started the nifedipine for potential nutcracker esophagus. She is wary about this medication. She did not refill the Effexor since finishing the bottle for her hot flashes. She is not sure if it helped her nausea/vomiting. She last took several weeks ago.   She thinks not smoking marijuana exacerbates her symptoms.   Alleviated by: Phenergan and Ativan   History: -She reports she was evaluated for gastroparesis and was told she did not have delayed gastric emptying. She was Dr. Henrene Pastor (GI) in the past who did not have any further recommendations and recommended just follow-up with PCP. She also reports Reglan which she took for years helps.   # Hot flashes diabetes  She felt like her hot flashes were exacerbated by her Novolog.    # Diabetes follow-up  She feels like Novolog is exacerbating hot flashes and cramping.  She does not check BS at home. Her machine is broken.   Review of Systems     Objective:   Physical Exam        Assessment & Plan:  START Effexor. 1 tablet twice a day.   Please come back in a week to discuss your diabetes and let me know how the Effexor is going.

## 2013-03-14 NOTE — Patient Instructions (Addendum)
START Effexor. 1 tablet twice a day.   Please come back in a week to discuss your diabetes and let me know how the Effexor is going.

## 2013-03-15 NOTE — Assessment & Plan Note (Signed)
It is improved following hospitalization although she had mild flare yesterday. She reports it was because she did not smoke marijuana.  -Try re-starting Effexor to see if this helps. She had been on this and it seemed like her flares were less severe/not as bad.  -Follow-up in 1 week. Consider restarting nifedipine for potential nutcracker syndrome. We will not start today since restarting Effexor.

## 2013-03-15 NOTE — Assessment & Plan Note (Addendum)
Rx for meter given. She will purchase and follow-up in 1 week to discuss sugars. Of note, se reports that Novolog exacerbations cyclical vomiting symptoms whereas Lantus does not, and she was well controlled on Lantus in the hospital. Consider discontinuing Novolog and adjusting Lantus dose.

## 2013-03-19 ENCOUNTER — Telehealth: Payer: Self-pay | Admitting: *Deleted

## 2013-03-19 MED ORDER — FREESTYLE SYSTEM KIT: 1.0000 | PACK | Status: DC | PRN

## 2013-03-19 NOTE — Telephone Encounter (Signed)
Rx printed and signed.  

## 2013-03-19 NOTE — Telephone Encounter (Signed)
[facility] pharmacy calling.  They received Rx for Freestyle Meter.  They are requesting new prescription be sent that includes a diagnosis code and specific testing instructions.  Will needs to be signed by Faculty that is PECOS registered.  Will forward to Dr. Verdie Drown to write new RX and have preceptor sign.  [name]

## 2013-03-19 NOTE — Telephone Encounter (Signed)
RX faxed to [facility] [phone].  [name]

## 2013-03-21 ENCOUNTER — Ambulatory Visit (INDEPENDENT_AMBULATORY_CARE_PROVIDER_SITE_OTHER): Payer: Medicare Other | Admitting: Family Medicine

## 2013-03-21 ENCOUNTER — Inpatient Hospital Stay (HOSPITAL_COMMUNITY)
Admission: AD | Admit: 2013-03-21 | Discharge: 2013-03-23 | DRG: 392 | Disposition: A | Discharge status: Home / Self Care | Payer: Medicare Other | Source: Ambulatory Visit | Attending: Family Medicine | Admitting: Family Medicine

## 2013-03-21 ENCOUNTER — Encounter (HOSPITAL_COMMUNITY): Payer: Self-pay | Admitting: *Deleted

## 2013-03-21 ENCOUNTER — Inpatient Hospital Stay: Admit: 2013-03-21 | Payer: Self-pay | Admitting: Family Medicine

## 2013-03-21 VITALS — BP 215/116 | HR 94

## 2013-03-21 DIAGNOSIS — F419 Anxiety disorder, unspecified: Secondary | ICD-10-CM | POA: Diagnosis present

## 2013-03-21 DIAGNOSIS — F1290 Cannabis use, unspecified, uncomplicated: Secondary | ICD-10-CM

## 2013-03-21 DIAGNOSIS — F339 Major depressive disorder, recurrent, unspecified: Secondary | ICD-10-CM | POA: Diagnosis present

## 2013-03-21 DIAGNOSIS — E1322 Other specified diabetes mellitus with diabetic chronic kidney disease: Secondary | ICD-10-CM

## 2013-03-21 DIAGNOSIS — F4310 Post-traumatic stress disorder, unspecified: Secondary | ICD-10-CM | POA: Diagnosis present

## 2013-03-21 DIAGNOSIS — I252 Old myocardial infarction: Secondary | ICD-10-CM

## 2013-03-21 DIAGNOSIS — R112 Nausea with vomiting, unspecified: Secondary | ICD-10-CM

## 2013-03-21 DIAGNOSIS — R000 Tachycardia, unspecified: Secondary | ICD-10-CM | POA: Diagnosis present

## 2013-03-21 DIAGNOSIS — I160 Hypertensive urgency: Secondary | ICD-10-CM

## 2013-03-21 DIAGNOSIS — K219 Gastro-esophageal reflux disease without esophagitis: Secondary | ICD-10-CM | POA: Diagnosis present

## 2013-03-21 DIAGNOSIS — F17200 Nicotine dependence, unspecified, uncomplicated: Secondary | ICD-10-CM | POA: Diagnosis present

## 2013-03-21 DIAGNOSIS — F411 Generalized anxiety disorder: Secondary | ICD-10-CM | POA: Diagnosis present

## 2013-03-21 DIAGNOSIS — Z79899 Other long term (current) drug therapy: Secondary | ICD-10-CM

## 2013-03-21 DIAGNOSIS — IMO0002 Reserved for concepts with insufficient information to code with codable children: Secondary | ICD-10-CM | POA: Diagnosis present

## 2013-03-21 DIAGNOSIS — E1142 Type 2 diabetes mellitus with diabetic polyneuropathy: Secondary | ICD-10-CM | POA: Diagnosis present

## 2013-03-21 DIAGNOSIS — E1365 Other specified diabetes mellitus with hyperglycemia: Secondary | ICD-10-CM

## 2013-03-21 DIAGNOSIS — F1210 Cannabis abuse, uncomplicated: Secondary | ICD-10-CM | POA: Diagnosis present

## 2013-03-21 DIAGNOSIS — E1149 Type 2 diabetes mellitus with other diabetic neurological complication: Secondary | ICD-10-CM | POA: Diagnosis present

## 2013-03-21 DIAGNOSIS — K920 Hematemesis: Secondary | ICD-10-CM | POA: Diagnosis present

## 2013-03-21 DIAGNOSIS — I10 Essential (primary) hypertension: Secondary | ICD-10-CM

## 2013-03-21 DIAGNOSIS — Z794 Long term (current) use of insulin: Secondary | ICD-10-CM

## 2013-03-21 DIAGNOSIS — K208 Other esophagitis without bleeding: Secondary | ICD-10-CM | POA: Diagnosis present

## 2013-03-21 DIAGNOSIS — I129 Hypertensive chronic kidney disease with stage 1 through stage 4 chronic kidney disease, or unspecified chronic kidney disease: Secondary | ICD-10-CM | POA: Diagnosis present

## 2013-03-21 DIAGNOSIS — N184 Chronic kidney disease, stage 4 (severe): Secondary | ICD-10-CM | POA: Diagnosis present

## 2013-03-21 DIAGNOSIS — R1115 Cyclical vomiting syndrome unrelated to migraine: Principal | ICD-10-CM | POA: Diagnosis present

## 2013-03-21 LAB — GLUCOSE, CAPILLARY
Glucose-Capillary: 180 mg/dL — ABNORMAL HIGH (ref 70–99)
Glucose-Capillary: 121 mg/dL — ABNORMAL HIGH (ref 70–99)

## 2013-03-21 LAB — COMPREHENSIVE METABOLIC PANEL
CO2: 25 mEq/L (ref 19–32)
Calcium: 10 mg/dL (ref 8.4–10.5)
Creatinine, Ser: 1.6 mg/dL — ABNORMAL HIGH (ref 0.50–1.10)
GFR calc Af Amer: 43 mL/min — ABNORMAL LOW (ref >90)
GFR calc non Af Amer: 37 mL/min — ABNORMAL LOW (ref >90)
Glucose, Bld: 212 mg/dL — ABNORMAL HIGH (ref 70–99)
Sodium: 141 mEq/L (ref 135–145)
Total Protein: 8.1 g/dL (ref 6.0–8.3)

## 2013-03-21 LAB — CBC WITH DIFFERENTIAL/PLATELET
Eosinophils Absolute: 0 10*3/uL (ref 0.0–0.7)
Eosinophils Relative: 0 % (ref 0–5)
HCT: 34.3 % — ABNORMAL LOW (ref 36.0–46.0)
Lymphocytes Relative: 15 % (ref 12–46)
Lymphs Abs: 1.3 10*3/uL (ref 0.7–4.0)
MCH: 31.6 pg (ref 26.0–34.0)
MCV: 88.9 fL (ref 78.0–100.0)
Monocytes Absolute: 0.3 10*3/uL (ref 0.1–1.0)
Platelets: 223 10*3/uL (ref 150–400)
RDW: 13.8 % (ref 11.5–15.5)
WBC: 8.5 10*3/uL (ref 4.0–10.5)

## 2013-03-21 LAB — MAGNESIUM: Magnesium: 1.7 mg/dL (ref 1.5–2.5)

## 2013-03-21 MED ORDER — SODIUM CHLORIDE 0.9 % IV SOLN
INTRAVENOUS | Status: DC
  Administered 2013-03-21 – 2013-03-23 (×4): via INTRAVENOUS

## 2013-03-21 MED ORDER — INSULIN ASPART 100 UNIT/ML ~~LOC~~ SOLN
0.0000 [IU] | Freq: Three times a day (TID) | SUBCUTANEOUS | Status: DC
  Administered 2013-03-21: 3 [IU] via SUBCUTANEOUS
  Administered 2013-03-22: 5 [IU] via SUBCUTANEOUS

## 2013-03-21 MED ORDER — INSULIN GLARGINE 100 UNIT/ML ~~LOC~~ SOLN
10.0000 [IU] | Freq: Every day | SUBCUTANEOUS | Status: DC
  Administered 2013-03-21: 10 [IU] via SUBCUTANEOUS
  Filled 2013-03-21 (×2): qty 0.1

## 2013-03-21 MED ORDER — SODIUM CHLORIDE 0.9 % IJ SOLN
3.0000 mL | Freq: Two times a day (BID) | INTRAMUSCULAR | Status: DC
  Administered 2013-03-21: 3 mL via INTRAVENOUS
  Administered 2013-03-23: 10 mL via INTRAVENOUS

## 2013-03-21 MED ORDER — PANTOPRAZOLE SODIUM 40 MG PO TBEC: 40.0000 mg | DELAYED_RELEASE_TABLET | Freq: Every day | ORAL | Status: DC

## 2013-03-21 MED ORDER — LORAZEPAM 2 MG/ML IJ SOLN
2.0000 mg | INTRAMUSCULAR | Status: DC | PRN
  Administered 2013-03-21 – 2013-03-23 (×3): 2 mg via INTRAVENOUS
  Filled 2013-03-21 (×3): qty 1

## 2013-03-21 MED ORDER — CARVEDILOL 25 MG PO TABS
25.0000 mg | ORAL_TABLET | Freq: Two times a day (BID) | ORAL | Status: DC
  Administered 2013-03-22 – 2013-03-23 (×3): 25 mg via ORAL
  Filled 2013-03-21 (×6): qty 1

## 2013-03-21 MED ORDER — ASPIRIN 81 MG PO CHEW
81.0000 mg | CHEWABLE_TABLET | Freq: Every day | ORAL | Status: DC
  Administered 2013-03-22 – 2013-03-23 (×2): 81 mg via ORAL
  Filled 2013-03-21 (×2): qty 1

## 2013-03-21 MED ORDER — PROMETHAZINE HCL 25 MG/ML IJ SOLN
12.5000 mg | INTRAMUSCULAR | Status: DC | PRN
  Administered 2013-03-21 – 2013-03-22 (×3): 12.5 mg via INTRAVENOUS
  Filled 2013-03-21 (×4): qty 1

## 2013-03-21 MED ORDER — SODIUM CHLORIDE 0.9 % IV BOLUS (SEPSIS)
500.0000 mL | Freq: Once | INTRAVENOUS | Status: AC
  Administered 2013-03-21: 500 mL via INTRAVENOUS

## 2013-03-21 MED ORDER — PANTOPRAZOLE SODIUM 40 MG IV SOLR
40.0000 mg | Freq: Two times a day (BID) | INTRAVENOUS | Status: DC
  Administered 2013-03-21 – 2013-03-22 (×2): 40 mg via INTRAVENOUS
  Filled 2013-03-21 (×4): qty 40

## 2013-03-21 MED ORDER — HYDROMORPHONE HCL PF 1 MG/ML IJ SOLN
0.5000 mg | INTRAMUSCULAR | Status: AC
  Administered 2013-03-22: 0.5 mg via INTRAVENOUS
  Filled 2013-03-21: qty 1

## 2013-03-21 MED ORDER — METOCLOPRAMIDE HCL 5 MG/ML IJ SOLN
10.0000 mg | Freq: Four times a day (QID) | INTRAMUSCULAR | Status: DC
  Administered 2013-03-21 – 2013-03-22 (×3): 10 mg via INTRAVENOUS
  Filled 2013-03-21 (×7): qty 2

## 2013-03-21 MED ORDER — HYDRALAZINE HCL 20 MG/ML IJ SOLN: 10.0000 mg | INTRAMUSCULAR | Status: DC | PRN

## 2013-03-21 MED ORDER — PANTOPRAZOLE SODIUM 40 MG IV SOLR
40.0000 mg | Freq: Once | INTRAVENOUS | Status: AC
  Administered 2013-03-21: 40 mg via INTRAVENOUS
  Filled 2013-03-21: qty 40

## 2013-03-21 MED ORDER — PROMETHAZINE HCL 25 MG/ML IJ SOLN
25.0000 mg | Freq: Once | INTRAMUSCULAR | Status: AC
  Administered 2013-03-21: 25 mg via INTRAMUSCULAR

## 2013-03-21 MED ORDER — LORAZEPAM 2 MG/ML IJ SOLN: 2.0000 mg | Freq: Once | INTRAMUSCULAR | Status: DC

## 2013-03-21 MED ORDER — HYDROMORPHONE HCL PF 1 MG/ML IJ SOLN
0.5000 mg | INTRAMUSCULAR | Status: DC | PRN
  Administered 2013-03-21 – 2013-03-22 (×4): 0.5 mg via INTRAVENOUS
  Filled 2013-03-21 (×4): qty 1

## 2013-03-21 NOTE — H&P (Signed)
Chief Complaint: nausea, vomiting/hematemesis  Assessment and plan: This is a 39 YOF who is a patient of mine and who I saw in clinic this morning due to persistent nausea and vomiting that started this morning. She has a history of cyclical vomiting with a history of frequent admission due to refractory symptoms. She was recently admitted 03/02/2013 for this issue. Her exacerbations may or may not be associated with her routine marijuana use.  Of note, I had recently started her on Effexor to see if this would help with her cyclical vomiting and due to her history of hot flashes. She started this medication a couple of days ago. She had tolerated this medication in the past.  We will admit to FMTS, telemetry.  # Nausea, vomiting, diaphoresis  # History of significant cyclical vomiting, history of chronic marijuana use  # Hypertensive urgency  -Dilaudid 0.5 q 4 prn (one dose now), Ativan 2 mg IV q 4 prn (one dose now), Phenergan IV 12.5 mg q 4 hours prn (she was given 25 mg IM in clinic)  -She reports Zofran provides no relief  -See below regarding diet, fluids  -CMET, lipase, CBC  -We will repeat abdominal examination once the patient has been given above medications. Consider CT abdomen if pain remains significant.  -She had seen Dr. Henrene Pastor ([facility]) in the past. Upper endoscopy performed 03/2011; gastric emptying study done at that time as well taht showed moderate delay in gastri emptying. Reglan has not worked for her in the past>>>We will consult [facility]; she has frequent episodes of hematemesis associated with her vomiting, and there has been concern for potential nutcracker syndrome. She had been treated empirically with nifedipine during her admissions, but this was not continued as an outpatient.  -Telemetry. Continue home Coreg, hold lisinopril in setting of dehydration. May not tolerate Coreg; Hydralazine prn  RENAL  # Chronic renal insufficiency, stage 3-4. Baseline Cr 1.7-1.9.   -Check Cr  PPx  # DVT PPx: SCD  ENDO  # Diabetes mellitus, poorly controlled, on insulin. Last HgbA1c 9.2 (02/11/2013).  -moderate SSI  -Continue reduced home Lantus while NPO>>>from 20 units to 10 units  FEN/GI  # Diet: NPO  # IVF: NS @ 125, 0.5 L NS fluid bolus  PSYCH  # History of depression  -Hold Effexor for now; it may be contributing to her hypertension  DISPO: pending clinical improvement  -Consider sending home with rectal Ativan/Phenergan in event of PO intolerance to help with future exacerbations  HPI: She started experiencing symptoms when she woke up this morning. She felt nauseous yesterday but her symptoms were not severe. Due to her symptoms, she was unable to tolerate her medication this morning, including the medications that help control her cyclical vomiting syndrome. She last smoked marijuana yesterday, 1/2 a joint, which is her usual daily amount.  She denies recent illness, sick contacts.  She denies constipation. She did have a few bowel movement yesterday/today, although she denies loose stools or diarrhea.  She denies headache, vision changes.  Past Medical History   Diagnosis  Date   .  PANCREATITIS  05/09/2008   .  NEUROPATHY, DIABETIC  02/23/2007   .  Cyclic vomiting syndrome    .  Hypertension    .  Complication of anesthesia      "problems waking up"   .  Heart murmur    .  Myocardial infarction  07/2007     "they say I've had a silent one; I  don't know"   .  Pneumonia    .  Type II diabetes mellitus    .  Anemia    .  Blood transfusion    .  GERD (gastroesophageal reflux disease)    .  Anxiety    .  Depression    .  Diabetic gastroparesis      /e-chart   .  Smoker    .  Marijuana smoker, continuous     Past Surgical History   Procedure  Laterality  Date   .  Port-a-cath placement   ~ 2008     right chest; "poor access; frequent hospitalizations"   .  Laparotomy and lysis of adhesions     .  Abdominal hysterectomy   02/2002   .   Cholecystectomy   1980's   .  Cardiac catheterization     .  Dilation and evacuation   X3   .  Left hand surgery     .  Cataract extraction w/ intraocular lens implant, bilateral     .  Colonoscopy     .  Upper gi endoscopy      Family History   Problem  Relation  Age of Onset   .  Diabetes type II  Mother    .  Diabetes  Mother    .  Hypertension  Mother    .  Diabetes type II  Sister    .  Diabetes  Sister    .  Hypertension  Sister    .  Hypertension  Brother    Social History: reports that she has been smoking Cigarettes. She has a 11 pack-year smoking history. She has never used smokeless tobacco. She reports that she uses illicit drugs (Marijuana). She reports that she does not drink alcohol.  See HPI  She denies alcohol or other illicit drugs  Allergies:  Allergies   Allergen  Reactions   .  Erythromycin  Nausea And Vomiting   .  Acetaminophen  Nausea And Vomiting   .  Oxycodone-Acetaminophen  Nausea Only     Only intolerant to APAP component   ROS  See HPI with inclusion of following:  Endorses subjective fevers and chills  Denies vaginal discharge, irritation  Denies chest pain  Denies dysuria/urgency/frequency  Blood pressure 215/116, pulse 94.  Physical Exam  Gen: significant distress, writhing on exam table, diaphoretic  HEENT:  Head: [place]/AT  Eyes: normal conjunctiva without injection or tearing  Nose: no rhinorrhea, normal turbinates  Mouth: mildly dry MM  CV: RRR  PULM: NI WOB; CTAB without w/r/r  ABD: NABS, soft, diffuse abdominal tenderness all quadrants, worse epigastrum  EXT: no edema  SKIN: no obvious rash  NEURO: intact, moves all extremities well, appropriate to questions  OH PARK, ANGELA  03/21/2013, 12:12 PM  Patient precepted with clinic attending Dr. Dalbert Mayotte.

## 2013-03-21 NOTE — Consult Note (Signed)
[facility] Consult: 2:06 PM 03/21/2013   Referring Provider: Dr Brooke Dare of [place] Primary Care Physician:  Verdie Drown, [name], MD Primary Gastroenterologist:  Drs Fuller Plan, Henrene Pastor during inpt assessments   Reason for Consultation:  Chronic, recurrent vomiting with hemetemesis  HPI: [name] [name] is a 47 y.o. female.  She has IDDM, diabetic neuropathy, hx pancreatitis.  S/p hysterectomy (2003) and cholecystectomy. Stage 3-4 chronic kidney disease, pancreatitis of undefined cause.   Long history of cyclic vomiting dating back many years.  EGDs since 2005 demonstrate esophagitis. GES studies normal in 2008, delayed in 2012.  Multiple admissions with acute on chronic vomiting, sometimes associated with hemetemesis.  Has been to [facility] to GI specialists in past.      She was admitted 3/7 - 3/10 with same n/v/epigastric pain, blood in emesis. This improved with IV fluids, IV Dilaudid, Ativan, Zofran, Phenergan, Protonix, and GI cocktails.  Uncontrolled htn also treated during the admission.  Renal function was stable. Sugar at admission 446. In Feb 2014 Hgb A1c was 9.2.  Lipase 3/7 was 59, but reached as high as 215 on 02/11/13.  She has history of elevated Lipase and pancreatitis but CT scan in 2012 did not show any changes in pancreas. LFTs consistently normal.  Had been smoking marijuana before 3/7.  This is apparently a known nausea trigger for her. However she does not endorse this as trigger and admits to smoking pot every other day on average.  No ETOH.   rx'd meds include but not limited to: Oxycodone, 81 ASA, prn Promethazine.  PMD started Effexor recently in effort to limit cyclic n/v.  Pt says she has not seen any GI specialists in past few years.  PMD is wondering if pt has nutcracker esophagus, though nothing in radiologic or endoscopic hx to suggest this is an issue.  Not particularly c/o dysphagia.  Interestingly not on any Rx PPI nor any  promitility drugs.   Readmitted today with n/v. Had    ENDOSCOPIC STUDIES: 03/2011  EGD  By Dr Fuller Plan for n/v Esophagitis, small HH.  Pathology c/w ulcerative esophagitis.   12/2003  EGD by Dr Henrene Pastor for esophagitis Findings  - ESOPHAGEAL INFLAMMATION: as a result of reflux. Severity is severe,  ulcerations present. Proximal margin 29 cm from mouth, distal  margin 39 cm. Length of inflammation: 10 cm. Edema present. [place] Classification: Grade C. ICD9: Esophagitis, Reflux: 530.11.  HIATAL HERNIA:  Comments:  OTHERWISE NORMAL EXAM  Assessment  Abnormal examination, see findings above.  Diagnoses:  530.11: Esophagitis, Reflux.  530.10: Esophagitis, Unspecified.  Comments:  GASTROPARESEIS    01/2007 EGD Path:  Chronic gastritis.   Past Medical History  Diagnosis Date  . PANCREATITIS 05/09/2008  . NEUROPATHY, DIABETIC 02/23/2007  . Cyclic vomiting syndrome   . Hypertension   . Complication of anesthesia     "problems waking up"  . Heart murmur   . Myocardial infarction 07/2007    "they say I've had a silent one; I don't know"  . Pneumonia   . Type II diabetes mellitus   . Anemia   . Blood transfusion   . GERD (gastroesophageal reflux disease)   . Anxiety   . Depression   . Diabetic gastroparesis     /e-chart  . Smoker   . Marijuana smoker, continuous     Past Surgical History  Procedure Laterality Date  . Port-a-cath placement  ~ 2008    right chest; "poor access; frequent hospitalizations"  . Laparotomy and  lysis of adhesions    . Abdominal hysterectomy  02/2002  . Cholecystectomy  1980's  . Cardiac catheterization    . Dilation and evacuation  X3  . Left hand surgery    . Cataract extraction w/ intraocular lens  implant, bilateral    . Colonoscopy    . Upper gi endoscopy      Prior to Admission medications   Medication Sig Start Date End Date Taking? Authorizing Provider  aspirin EC 81 MG tablet Take 81 mg by mouth daily.    Historical Provider, MD   BD PEN NEEDLE NANO U/F 32G X 4 MM MISC  01/17/13   Historical Provider, MD  carvedilol (COREG) 25 MG tablet Take 1 tablet (25 mg total) by mouth 2 (two) times daily with a meal. 03/10/13 03/10/14  Carolin Guernsey, MD  cyclobenzaprine (FLEXERIL) 10 MG tablet Take 1 tablet (10 mg total) by mouth 3 (three) times daily as needed for muscle spasms. 10/18/12   Carolin Guernsey, MD  diclofenac sodium (VOLTAREN) 1 % GEL Apply 2 g topically 4 (four) times daily. 03/14/13   Carolin Guernsey, MD  EPINEPHrine (ADRENALIN) 1 MG/ML injection Inject 1 mL (1 mg total) into the muscle once. 03/14/13   Carolin Guernsey, MD  EPINEPHrine (EPIPEN) 0.3 MG/0.3ML DEVI Inject 0.3 mg into the muscle once. as needed for severe anaphylaxis. (bee stings)    Historical Provider, MD  glucose monitoring kit (FREESTYLE) monitoring kit 1 each by Does not apply route as needed for other. Please provide most cost affordable device, strips, Lancets, and other necessary supplies to help check her sugars. 03/19/13   Carolin Guernsey, MD  insulin aspart (NOVOLOG) 100 UNIT/ML injection Inject 4-8 Units into the skin 3 (three) times daily before meals. Sliding scale    Historical Provider, MD  insulin glargine (LANTUS) 100 UNIT/ML injection Inject 20 Units into the skin daily. 03/05/13   Coral Spikes, DO  Insulin Syringe-Needle U-100 (PRODIGY INSULIN SYRINGE) 31G X 5/16" 0.3 ML MISC For Lantus 01/17/13   Carolin Guernsey, MD  LIDODERM 5 %  02/27/13   Historical Provider, MD  lisinopril (PRINIVIL,ZESTRIL) 40 MG tablet Take 1 tablet (40 mg total) by mouth daily. 10/18/12   Carolin Guernsey, MD  LORazepam (ATIVAN) 1 MG tablet Take 1 tablet (1 mg total) by mouth 2 (two) times daily as needed for anxiety. 03/14/13   Carolin Guernsey, MD  NIFEDICAL XL 30 MG 24 hr tablet  02/12/13   Historical Provider, MD  omeprazole (PRILOSEC) 20 MG capsule  02/27/13   Historical Provider, MD  ondansetron (ZOFRAN) 4 MG tablet Take 1 tablet (4 mg total) by mouth every 8  (eight) hours as needed for nausea. 03/05/13   Coral Spikes, DO  Oxycodone HCl 10 MG TABS Take 1 tablet (10 mg total) by mouth 3 (three) times daily as needed. 03/14/13   Carolin Guernsey, MD  promethazine (PHENERGAN) 25 MG tablet Take 1 tablet (25 mg total) by mouth every 6 (six) hours as needed for nausea. 03/05/13   Coral Spikes, DO  sucralfate (CARAFATE) 1 G tablet  02/27/13   Historical Provider, MD  venlafaxine (EFFEXOR) 37.5 MG tablet Take 1 tablet (37.5 mg total) by mouth 2 (two) times daily. 03/14/13   Carolin Guernsey, MD    Scheduled Meds: . aspirin  81 mg Oral Daily  . carvedilol  25 mg Oral BID  WC  .  HYDROmorphone (DILAUDID) injection  0.5 mg Intravenous NOW  . insulin aspart  0-15 Units Subcutaneous TID WC  . insulin glargine  10 Units Subcutaneous Daily  . LORazepam  2 mg Intravenous Once  . pantoprazole  40 mg Oral Daily  . sodium chloride  500 mL Intravenous Once  . sodium chloride  3 mL Intravenous Q12H   Infusions: . sodium chloride     PRN Meds: hydrALAZINE, HYDROmorphone (DILAUDID) injection, LORazepam, promethazine   Allergies as of 03/21/2013 - Review Complete 03/14/2013  Allergen Reaction Noted  . Erythromycin Nausea And Vomiting   . Acetaminophen Nausea And Vomiting 01/03/2012  . Oxycodone-acetaminophen Nausea Only     Family History  Problem Relation Age of Onset  . Diabetes type II Mother   . Diabetes Mother   . Hypertension Mother   . Diabetes type II Sister   . Diabetes Sister   . Hypertension Sister   . Hypertension Brother     History   Social History  . Marital Status: Single    Spouse Name: N/A    Number of Children: N/A  . Years of Education: N/A   Occupational History  . Not on file.   Social History Main Topics  . Smoking status: Current Some Day Smoker -- 0.50 packs/day for 22 years    Types: Cigarettes    Last Attempt to Quit: 02/22/2012  . Smokeless tobacco: Never Used  . Alcohol Use: No  . Drug Use: Yes    Special:  Marijuana     Comment: Current Daily to weekly use  . Sexually Active: Yes    Birth Control/ Protection: None   Other Topics Concern  . Not on file   Social History Narrative   Lives in [place] - some minimal family support   Has 10 grandchildren   Cares for her Dog [name] (Mixed female [place] and Cockerspaniel)     REVIEW OF SYSTEMS: Constitutional:  No weight loss ENT:  No nose bleeds Pulm:  No SOB.  Occasional coughing CV:  No chest pain.  Rapid heart rate at times. No pedal swellilng GU:  No oliguria or dsuria GI:  Per HPI Pain:  Hurts all over in back, legs, arms.  Heme:  No hx iron supplementation.    Transfusions:  None that she recalls Neuro:  No headaches, no seizures, no blurry vision Derm:  No itching or rash.  No sores Endocrine:  Sweats, chill.  Night sweats Immunization:  Flu shot current Travel:  none   PHYSICAL EXAM: Vital signs in last 24 hours: Temp:  [98.3 F (36.8 C)] 98.3 F (36.8 C) (03/26 1300) Pulse Rate:  [94-100] 100 (03/26 1300) Resp:  [20] 20 (03/26 1300) BP: (193-219)/(109-116) 193/109 mmHg (03/26 1318) SpO2:  [100 %] 100 % (03/26 1300)  General: lethargic, difficult to arouse.  Slightly diaphoretic but c/o of being cold.  Head:  No signs of trauma no swelling  Eyes:  No icterus or pallor Ears:  Not HOH  Nose:  No discharge Mouth:  No oral bleeding.  MM moist and pink Neck:  No JVD Lungs:  Clear B.  No labored breathing Heart: tachy, regular.  No MRG Abdomen:  Soft, active BS, tender in upper abdomen.  No guard or rebound.   Rectal: not done   Musc/Skeltl: no joint swelling of deformity Extremities:  No pedal swelling  Neurologic:  Oriented x 3.  Lethargic post Phenergen, Dilaudid Skin:  No rash or sores Tattoos:  none Nodes:  No inguinal adenopathy   Psych:  Flat affect.  [place].   Intake/Output from previous day:   Intake/Output this shift:    LAB RESULTS: No results found for this basename: WBC, HGB, HCT, PLT,  in  the last 72 hours BMET Lab Results  Component Value Date   NA 146* 03/03/2013   NA 136 03/02/2013   NA 137 02/12/2013   K 3.7 03/03/2013   K 3.7 03/02/2013   K 3.4* 02/12/2013   CL 108 03/03/2013   CL 97 03/02/2013   CL 100 02/12/2013   CO2 24 03/03/2013   CO2 25 03/02/2013   CO2 29 02/12/2013   GLUCOSE 257* 03/03/2013   GLUCOSE 446* 03/02/2013   GLUCOSE 129* 02/12/2013   BUN 24* 03/03/2013   BUN 31* 03/02/2013   BUN 33* 02/12/2013   CREATININE 1.51* 03/03/2013   CREATININE 1.84* 03/02/2013   CREATININE 1.94* 02/12/2013   CALCIUM 10.2 03/03/2013   CALCIUM 10.5 03/02/2013   CALCIUM 8.3* 02/12/2013   LFT No results found for this basename: PROT, ALBUMIN, AST, ALT, ALKPHOS, BILITOT, BILIDIR, IBILI,  in the last 72 hours PT/INR Lab Results  Component Value Date   INR 1.0 01/24/2009   INR 1.1 08/17/2007   INR 1.0 06/18/2007   Hepatitis Panel No results found for this basename: HEPBSAG, HCVAB, HEPAIGM, HEPBIGM,  in the last 72 hours C-Diff No components found with this basename: cdiff    Drugs of Abuse     Component Value Date/Time   LABOPIA NONE DETECTED 09/23/2012 1738   LABOPIA  Value: POSITIVE (NOTE) Result repeated and verified. Sent for confirmatory testing* 02/15/2011 0537   COCAINSCRNUR NONE DETECTED 09/23/2012 1738   COCAINSCRNUR NEGATIVE 02/15/2011 0537   LABBENZ NONE DETECTED 09/23/2012 1738   LABBENZ NEGATIVE 02/15/2011 0537   AMPHETMU NONE DETECTED 09/23/2012 1738   AMPHETMU NEGATIVE 02/15/2011 0537   THCU POSITIVE* 09/23/2012 1738   LABBARB NONE DETECTED 09/23/2012 1738     RADIOLOGY STUDIES: 03/2011  GES Findings: At 1 hour, 81% of the counts remain in the stomach. At 2  hours, 53% remain. Normal is less than 30% at 2 hours.  IMPRESSION:  Moderate delay in gastric emptying.  03/2007  GES IMPRESSION:  Normal gastric emptying study. Only 2% of activity remains in the stomach at 1 hour.  11/2011  CT scan abdomen and pelvis IMPRESSION:  1. Marked wall thickening of the distal esophagus cannot  be  definitively characterized but is most consistent with inflammatory  change.  2. No acute finding in the abdomen. Specifically, negative for  small bowel obstruction.  3. Status post cholecystectomy and hysterectomy  02/11/2013  Ultrasound Abdomen IMPRESSION: Echogenic kidneys bilaterally compatible with medical  renal disease.  Tiny right pleural effusion.   IMPRESSION: *  Chronic n/v.  Esophagitis on previous EGDs.  Some gastic hypomotility on 2012 GES.  Taking 2 to 3 oxycodone daily for chronic pain is certainly contributing and Marijuana may also be a factor.   *  Hx elevated Lipase and undefined dx of pancreatitis without any imaging to support dx of pancreatitis.  Lipase is currently normal *  IDDM.  Likely has element of diabetic gastroparesis.  *  Chronic kidney disease.  PLAN: *  Per Dr Henrene Pastor (see below) *  I added IV instead of po Protonix along with scheduled Reglan.   Carafate may be of benefit if she can keep it down but did not add this yet.    LOS: 0  days   Azucena Freed  03/21/2013, 2:06 PM Pager: [phone]  GI ATTENDING  History, laboratories, prior endoscopy reports reviewed. Patient seen and examined. Agree with history and physical exam as outlined above.  IMPRESSION 1. History of cyclic vomiting. Patient has multiple risk factors for recurrent vomiting including chronic cannabis use, narcotic use with documented gastroparesis, diabetes mellitus, and erosive esophagitis-currently on no therapy.  RECOMMENDATIONS 1. Stop smoking marijuana 2. Minimize or eliminate narcotics 3. Maximal controll of diabetes 4. NEEDS TO BE ON CHRONIC PPI THERAPY, PARTICULARLY SINCE SHE IS KNOWN TO HAVE DOCUMENTED EROSIVE ESOPHAGITIS. Apparently was not taking his outpatient 5. Antiemetics as needed. Would recommend running antiemetics until she is taking by mouth's. At that point, you could convert PPI therapy to by mouth as well. 6. No need or indication for endoscopy. This is  not Nutcracker esophagus. Her hematemesis was minimal. Likely has esophagitis as previously documented or retching gastropathy.  WILL SIGN OFF. Call for questions or problems. Thank you  Docia Chuck. Geri Seminole., M.D. [facility] Division of Gastroenterology

## 2013-03-21 NOTE — Progress Notes (Signed)
Patient arrived via w/c from admitting actively vomiting and obviously uncomfortable and diaphoretic.  CBG 180's but bp >190/106 - placed on tele - sr vs st.  Patient vomiting coffee ground emesis with a small amount of bright red blood.  Called md and made aware of above, iv team accessed port and started bolus and fluids per md orders.  I did discuss safety plan and placed on bed alarm.   1400 - patient sleeping without any s/s of distress at present.  Continue to monitor.

## 2013-03-21 NOTE — H&P (Signed)
FMTS Attending Admit Note Patient seen and examined by me with Dr Verdie Drown, I have discussed the patient's care with her and I agree with her assessment and plan for admission.  Patient with history of cyclic vomiting, multiple past admissions for this.  She is noted to have markedly elevated blood pressure in the office today.  She appears in moderate distress at the time of presentation. Recently started on Effexor, which may be contributing to her elevated blood pressure. Would hold this as we treat her hyperemesis and rehydrate.  Dalbert Mayotte, MD

## 2013-03-21 NOTE — Progress Notes (Signed)
PGY 1 Update: Pt seen and examined at bedside.  In mild distress currently but did receive Promethazine x 1, and Dilaudid/Ativan x 1 as well.    Exam:  Mild Distress No rebound/guarding, + TTP diffuse Tachycardia/ regular rhythm, no murmurs appreciated   A/P  Intractable N/V - Possibility of hematemesis/coffee ground emesis without known preciptate food causing this.  Will have GI ([facility]) evaluate this for possible nutcracker esophagus vs repeat scope vs possible UGIB.  Continue with pain medication/Antiemetic/anxiolitic  F/U CMET/Lipase/CBC  HTN - Most likely combination from active pain and recent started on Effexor.  Holding Effexor, will give Hydralazine PRN for now.

## 2013-03-21 NOTE — Progress Notes (Signed)
[name] [name] is an 47 y.o. female.   Chief Complaint: nausea, vomiting/hematemesis  Assessment and plan: This is a 33 YOF who is a patient of mine and who I saw in clinic this morning due to persistent nausea and vomiting that started this morning. She has a history of cyclical vomiting with a history of frequent admission due to refractory symptoms. She was recently admitted 03/02/2013 for this issue. Her exacerbations may or may not be associated with her routine marijuana use.  Of note, I had recently started her on Effexor to see if this would help with her cyclical vomiting and due to her history of hot flashes. She started this medication a couple of days ago. She had tolerated this medication in the past.  We will admit to FMTS, telemetry.   # Nausea, vomiting, diaphoresis # History of significant cyclical vomiting, history of chronic marijuana use # Hypertensive urgency  -Dilaudid 0.5 q 4 prn (one dose now), Ativan 2 mg IV q 4 prn (one dose now), Phenergan IV 12.5 mg q 4 hours prn (she was given 25 mg IM in clinic)  -She reports Zofran provides no relief -See below regarding diet, fluids  -CMET, lipase, CBC -We will repeat abdominal examination once the patient has been given above medications. Consider CT abdomen if pain remains significant.  -She had seen Dr. Henrene Pastor ([facility]) in the past. Upper endoscopy performed 03/2011; gastric emptying study done at that time as well taht showed moderate delay in gastri emptying. Reglan has not worked for her in the past>>>We will consult [facility]; she has frequent episodes of hematemesis associated with her vomiting, and there has been concern for potential nutcracker syndrome. She had been treated empirically with nifedipine during her admissions, but this was not continued as an outpatient.  -Telemetry. Continue home Coreg, hold lisinopril in setting of dehydration. May not tolerate Coreg; Hydralazine prn   RENAL # Chronic renal  insufficiency, stage 3-4. Baseline Cr 1.7-1.9.  -Check Cr  PPx # DVT PPx: SCD  ENDO # Diabetes mellitus, poorly controlled, on insulin. Last HgbA1c 9.2 (02/11/2013).  -moderate SSI -Continue reduced home Lantus while NPO>>>from 20 units to 10 units  FEN/GI # Diet: NPO # IVF: NS @ 125, 0.5 L NS fluid bolus  PSYCH # History of depression -Hold Effexor for now; it may be contributing to her hypertension   DISPO: pending clinical improvement -Consider sending home with rectal Ativan/Phenergan in event of PO intolerance to help with future exacerbations   HPI: She started experiencing symptoms when she woke up this morning. She felt nauseous yesterday but her symptoms were not severe. Due to her symptoms, she was unable to tolerate her medication this morning, including the medications that help control her cyclical vomiting syndrome. She last smoked marijuana yesterday, 1/2 a joint, which is her usual daily amount.  She denies recent illness, sick contacts.  She denies constipation. She did have a few bowel movement yesterday/today, although she denies loose stools or diarrhea.  She denies headache, vision changes.    Past Medical History  Diagnosis Date  . PANCREATITIS 05/09/2008  . NEUROPATHY, DIABETIC 02/23/2007  . Cyclic vomiting syndrome   . Hypertension   . Complication of anesthesia     "problems waking up"  . Heart murmur   . Myocardial infarction 07/2007    "they say I've had a silent one; I don't know"  . Pneumonia   . Type II diabetes mellitus   . Anemia   .  Blood transfusion   . GERD (gastroesophageal reflux disease)   . Anxiety   . Depression   . Diabetic gastroparesis     /e-chart  . Smoker   . Marijuana smoker, continuous     Past Surgical History  Procedure Laterality Date  . Port-a-cath placement  ~ 2008    right chest; "poor access; frequent hospitalizations"  . Laparotomy and lysis of adhesions    . Abdominal hysterectomy  02/2002  .  Cholecystectomy  1980's  . Cardiac catheterization    . Dilation and evacuation  X3  . Left hand surgery    . Cataract extraction w/ intraocular lens  implant, bilateral    . Colonoscopy    . Upper gi endoscopy      Family History  Problem Relation Age of Onset  . Diabetes type II Mother   . Diabetes Mother   . Hypertension Mother   . Diabetes type II Sister   . Diabetes Sister   . Hypertension Sister   . Hypertension Brother    Social History:  reports that she has been smoking Cigarettes.  She has a 11 pack-year smoking history. She has never used smokeless tobacco. She reports that she uses illicit drugs (Marijuana). She reports that she does not drink alcohol. See HPI She denies alcohol or other illicit drugs  Allergies:  Allergies  Allergen Reactions  . Erythromycin Nausea And Vomiting  . Acetaminophen Nausea And Vomiting  . Oxycodone-Acetaminophen Nausea Only    Only intolerant to APAP component     ROS See HPI with inclusion of following: Endorses subjective fevers and chills Denies vaginal discharge, irritation Denies chest pain  Denies dysuria/urgency/frequency  Blood pressure 215/116, pulse 94. Physical Exam  Gen: significant distress, writhing on exam table, diaphoretic HEENT:   Head: /AT   Eyes: normal conjunctiva without injection or tearing   Nose: no rhinorrhea, normal turbinates   Mouth: mildly dry MM CV: RRR PULM: NI WOB; CTAB without w/r/r ABD: NABS, soft, diffuse abdominal tenderness all quadrants, worse epigastrum EXT: no edema SKIN: no obvious rash  NEURO: intact, moves all extremities well, appropriate to questions   [place] PARK, Zorian Gunderman 03/21/2013, 12:12 PM  Patient precepted with clinic attending Dr. Dalbert Mayotte.

## 2013-03-22 DIAGNOSIS — I10 Essential (primary) hypertension: Secondary | ICD-10-CM

## 2013-03-22 DIAGNOSIS — F1210 Cannabis abuse, uncomplicated: Secondary | ICD-10-CM

## 2013-03-22 LAB — GLUCOSE, CAPILLARY
Glucose-Capillary: 94 mg/dL (ref 70–99)
Glucose-Capillary: 88 mg/dL (ref 70–99)

## 2013-03-22 LAB — BASIC METABOLIC PANEL
Calcium: 9.4 mg/dL (ref 8.4–10.5)
GFR calc non Af Amer: 37 mL/min — ABNORMAL LOW (ref >90)
Glucose, Bld: 103 mg/dL — ABNORMAL HIGH (ref 70–99)
Sodium: 143 mEq/L (ref 135–145)

## 2013-03-22 LAB — CBC
MCH: 31.2 pg (ref 26.0–34.0)
MCHC: 34.1 g/dL (ref 30.0–36.0)
Platelets: 230 10*3/uL (ref 150–400)
RBC: 3.62 MIL/uL — ABNORMAL LOW (ref 3.87–5.11)

## 2013-03-22 MED ORDER — GLUCOSE 40 % PO GEL
ORAL | Status: AC
  Administered 2013-03-22: 37.5 g
  Filled 2013-03-22: qty 1

## 2013-03-22 MED ORDER — INSULIN GLARGINE 100 UNIT/ML ~~LOC~~ SOLN
10.0000 [IU] | Freq: Every day | SUBCUTANEOUS | Status: DC
  Filled 2013-03-22 (×2): qty 0.1

## 2013-03-22 MED ORDER — PANTOPRAZOLE SODIUM 40 MG PO TBEC
40.0000 mg | DELAYED_RELEASE_TABLET | Freq: Every day | ORAL | Status: DC
  Administered 2013-03-23: 40 mg via ORAL
  Filled 2013-03-22: qty 1

## 2013-03-22 MED ORDER — OXYCODONE HCL 5 MG PO TABS
5.0000 mg | ORAL_TABLET | Freq: Four times a day (QID) | ORAL | Status: DC | PRN
  Administered 2013-03-22 – 2013-03-23 (×2): 5 mg via ORAL
  Filled 2013-03-22 (×2): qty 1

## 2013-03-22 MED ORDER — METOCLOPRAMIDE HCL 10 MG PO TABS
10.0000 mg | ORAL_TABLET | Freq: Three times a day (TID) | ORAL | Status: DC
  Administered 2013-03-22 – 2013-03-23 (×5): 10 mg via ORAL
  Filled 2013-03-22 (×10): qty 1

## 2013-03-22 MED ORDER — PROMETHAZINE HCL 25 MG PO TABS: 12.5000 mg | ORAL_TABLET | Freq: Four times a day (QID) | ORAL | Status: DC | PRN

## 2013-03-22 MED ORDER — ONDANSETRON HCL 4 MG PO TABS: 8.0000 mg | ORAL_TABLET | Freq: Three times a day (TID) | ORAL | Status: DC | PRN

## 2013-03-22 MED ORDER — LISINOPRIL 40 MG PO TABS
40.0000 mg | ORAL_TABLET | Freq: Every day | ORAL | Status: DC
  Administered 2013-03-22 – 2013-03-23 (×2): 40 mg via ORAL
  Filled 2013-03-22 (×2): qty 1

## 2013-03-22 MED ORDER — GI COCKTAIL ~~LOC~~
30.0000 mL | Freq: Once | ORAL | Status: AC
  Administered 2013-03-22: 30 mL via ORAL
  Filled 2013-03-22: qty 30

## 2013-03-22 NOTE — Progress Notes (Signed)
Pt orientation to unit, room and routine.  Admission INP armband ID verified with patient. Patient and family verbalizing understanding of risks associated with falls. Pt verbalizes an understanding of how to use the call bell and to call for help before getting out of bed.  Skin, clean-dry- intact without evidence of bruising, or skin tears.   No evidence of skin break down noted on exam.   Will cont to monitor and assist as needed.  Velora Mediate, RN 03/22/2013 12:05 AM

## 2013-03-22 NOTE — Progress Notes (Signed)
Family Medicine Teaching Service Daily Progress Note Service Page: [phone]  Subjective:  Feeling less nauseated this am.  No vomiting this am.   Discussed advancing diet this am.  Objective: Temp:  [97.5 F (36.4 C)-98.7 F (37.1 C)] 97.5 F (36.4 C) (03/27 0504) Pulse Rate:  [94-105] 105 (03/27 0504) Resp:  [18-20] 18 (03/27 0504) BP: (175-219)/(71-116) 196/101 mmHg (03/27 0504) SpO2:  [100 %] 100 % (03/27 0504)  Exam: General: resting comfortably in bed this am. NAD. Cardiovascular: RRR. No murmurs, rubs, or gallops. Respiratory: CTAB. No rales, rhonchi, or wheeze. Abdomen: soft, nondistended. Slightly tender to palpation (diffusely) Extremities: warm, well perfused.  CBC BMET   Recent Labs Lab 03/21/13 1330  WBC 8.5  HGB 12.2  HCT 34.3*  PLT 223    Recent Labs Lab 03/21/13 1330  NA 141  K 3.9  CL 103  CO2 25  BUN 28*  CREATININE 1.60*  GLUCOSE 212*  CALCIUM 10.0      Assessment/Plan: 47 year old female with PMH of Cyclic vomiting syndrome, uncontrolled HTN, DM-2, and CKD who was admitted for persistent nausea and vomiting and hematemesis.  # Cyclic vomiting syndrome and Hematemesis - Will advance to clear liquid diet today. - Will continue IV fluids - NS @75  mL/hr - Will continue Protonix 40 mg daily.  Switching antiemetics and reglan to PO today. - GI consulted yesterday (3/26) - Recommendations: Continued use of PPI, Cessation of marijuana use, minimize narcotics, Antiemetics PRN, and better control of DM  # Hypertensive Urgency - BP difficult to control - Hydralazine PRN  - Will continue home Coreg 25 mg BID - Restarting Lisinopril 40 mg today  # CKD - stage 3; Baseline Creatinine 1.7-1.9. - Creatinine this am 1.6 - Will continue to monitor during admission  # DM-2 - Lantus 10 units and Moderate SSI  - currently well controlled.  # Depression - Holding Effexor at this time.  FEN/GI: NPO. NS @ 75 mL/hr PPx: Protonix; SCD's. Dispo:  Pending clinical improvement Code: Full code  Thersa Salt, DO 03/22/2013, 7:26 AM

## 2013-03-22 NOTE — Progress Notes (Signed)
Lantus 10 units held due to low CBG of 36. Sonnenberg MD made aware and agrees. Will continue to monitor. Velora Mediate

## 2013-03-22 NOTE — Progress Notes (Signed)
FMTS Attending Admission Note: Andrena Mews, MD I  have seen and examined this patient, reviewed their chart. I have discussed this patient with the resident. I agree with the resident's findings, assessment and care plan.  Briefly; Ms Arabelle has been admitted on multiple occasion for recurrent N/V for which is most likely related to her Marijuana use.This morning she feels lot better,last vomitus was 8pm last night,she still have mild stomach pain,BM normal.She stated she is ready to start eating as she has been NPO. Physical exam is benign,if tolerating PO,may d/c home today,patient counseled about quitting marijuana on multiple occasions.

## 2013-03-22 NOTE — Progress Notes (Signed)
Hypoglycemic Event  CBG: 46 Treatment: 15 GM carbohydrate snack  Symptoms: numbness in fingers  Follow-up CBG: Time:2055 CBG Result: 36  Possible Reasons for Event: Inadequate meal intake  Hypoglycemic Event  CBG: 36  Treatment: 15 GM carbohydrate snack  Symptoms: Drowsiness but arousable  Follow-up CBG: Time:2105 CBG Result: 48  Possible Reasons for Event: Inadequate meal intake  Hypoglycemic Event  CBG: 48   Treatment: 1 tube instant glucose  Symptoms: None  Follow-up CBG: Time: 2120 CBG Result:88  Possible Reasons for Event: Inadequate meal intake    Velora Mediate  Remember to initiate Hypoglycemia Order Set & complete

## 2013-03-23 ENCOUNTER — Ambulatory Visit: Payer: Medicare Other | Admitting: Family Medicine

## 2013-03-23 DIAGNOSIS — R112 Nausea with vomiting, unspecified: Secondary | ICD-10-CM | POA: Diagnosis not present

## 2013-03-23 DIAGNOSIS — R1115 Cyclical vomiting syndrome unrelated to migraine: Secondary | ICD-10-CM | POA: Diagnosis not present

## 2013-03-23 DIAGNOSIS — I10 Essential (primary) hypertension: Secondary | ICD-10-CM | POA: Diagnosis not present

## 2013-03-23 DIAGNOSIS — I160 Hypertensive urgency: Secondary | ICD-10-CM

## 2013-03-23 LAB — GLUCOSE, CAPILLARY
Glucose-Capillary: 110 mg/dL — ABNORMAL HIGH (ref 70–99)
Glucose-Capillary: 116 mg/dL — ABNORMAL HIGH (ref 70–99)

## 2013-03-23 LAB — RAPID URINE DRUG SCREEN, HOSP PERFORMED
Amphetamines: NOT DETECTED
Barbiturates: POSITIVE — AB
Tetrahydrocannabinol: POSITIVE — AB

## 2013-03-23 MED ORDER — PANTOPRAZOLE SODIUM 40 MG PO TBEC: 40.0000 mg | DELAYED_RELEASE_TABLET | Freq: Every day | ORAL | Status: DC

## 2013-03-23 MED ORDER — HEPARIN SOD (PORK) LOCK FLUSH 100 UNIT/ML IV SOLN
500.0000 [IU] | INTRAVENOUS | Status: AC | PRN
  Administered 2013-03-23: 500 [IU]

## 2013-03-23 MED ORDER — INSULIN ASPART 100 UNIT/ML ~~LOC~~ SOLN: 0.0000 [IU] | Freq: Three times a day (TID) | SUBCUTANEOUS | Status: DC

## 2013-03-23 NOTE — Discharge Summary (Signed)
FMTS Attending Admission Note: Aven Christen,MD I  have seen and examined this patient, reviewed their chart. I have discussed this patient with the resident. I agree with the resident's findings, assessment and care plan.  

## 2013-03-23 NOTE — Progress Notes (Signed)
FMTS Attending Admission Note: [name] Hairfield,MD I  have seen and examined this patient, reviewed their chart. I have discussed this patient with the resident. I agree with the resident's findings, assessment and care plan.  

## 2013-03-23 NOTE — Progress Notes (Signed)
I spoke with patient on the phone, I am her PCP.  -She is amenable to weaning off marijuana. She thinks this will be difficult since she has been on it for so long but she is willing to try.  -She also agrees that Ativan/oxycodone/phenergan are not helping her and that she is on to many medications. We will try weaning her off to see if we can get better control of cyclical vomiting.  -She will follow-up with me at least weekly for the next several weeks.   I appreciate the excellent care being provided by FMTS, other hospital staff, and for Dr. Blanch Media consultation.

## 2013-03-23 NOTE — Progress Notes (Signed)
Family Medicine Teaching Service Daily Progress Note Service Page: [phone]  Subjective:  Nausea resolved. Reports some mild abdominal pain.  No more episodes of emesis.  Objective: Temp:  [98.4 F (36.9 C)-98.9 F (37.2 C)] 98.7 F (37.1 C) (03/28 0509) Pulse Rate:  [72-100] 79 (03/28 0509) Resp:  [18] 18 (03/28 0509) BP: (127-170)/(61-81) 170/78 mmHg (03/28 0509) SpO2:  [99 %-100 %] 100 % (03/28 0509) Weight:  [159 lb 2.2 oz (72.184 kg)] 159 lb 2.2 oz (72.184 kg) (03/27 2032)  Exam: General: resting comfortably in bed this am. NAD. Cardiovascular: RRR. No murmurs, rubs, or gallops. Respiratory: CTAB. No rales, rhonchi, or wheeze. Abdomen: soft, nontender, nondistended. Extremities: warm, well perfused.  CBC BMET   Recent Labs Lab 03/21/13 1330 03/22/13 0637  WBC 8.5 8.9  HGB 12.2 11.3*  HCT 34.3* 33.1*  PLT 223 230    Recent Labs Lab 03/21/13 1330 03/22/13 0637  NA 141 143  K 3.9 3.5  CL 103 109  CO2 25 23  BUN 28* 24*  CREATININE 1.60* 1.59*  GLUCOSE 212* 103*  CALCIUM 10.0 9.4      Assessment/Plan: 47 year old female with PMH of Cyclic vomiting syndrome, uncontrolled HTN, DM-2, and CKD who was admitted for persistent nausea and vomiting and hematemesis.  # Cyclic vomiting syndrome and Hematemesis - Will advance diet today and plan to D/C home if tolerates.  Discontinuing IV fluids. - Will continue Protonix 40 mg daily.  Will continue PO Phenergan, PPI, Zofran, and Reglan. - GI consulted (3/26) - Recommendations: Continued use of PPI, Cessation of marijuana use, minimize narcotics, Antiemetics PRN, and better control of DM  # Hypertensive Urgency - BP difficult to control, slightly improved from admission - Will continuing PRN Hydralazine, Coreg 25 mg BID, and Lisinopril 40 mg daily  # CKD - stage 3; Baseline Creatinine 1.7-1.9. - Creatinine stable.  # DM-2 - Patient hypoglycemic overnight.  Insulin held. - CBG 110 this am.  Advancing diet and  restarting SSI (sensitive)  # Depression - Holding Effexor at this time.  FEN/GI: Full liquid diet.  PPx: Protonix; SCD's. Dispo: D/C home today. Code: Full code  Thersa Salt, DO 03/23/2013, 7:32 AM

## 2013-03-23 NOTE — Discharge Summary (Signed)
[facility] Discharge Summary  Patient name: [name] [name] Medical record number: [id number] Date of birth: Dec 09, 1966 Age: 47 y.o. Gender: female Date of Admission: 03/21/2013  Date of Discharge: 03/23/13 Admitting Physician: Andrena Mews, MD  Primary Care Provider: [facility], [name], MD  Indication for Hospitalization: Nausea, vomiting  Discharge Diagnoses:  Principal Problem:   Cyclic vomiting syndrome Active Problems:   DEPRESSION, MAJOR, RECURRENT   GERD   Uncontrolled secondary diabetes mellitus with stage 3 CKD (GFR 30-59)   Hypertensive urgency  Brief Hospital Course:  47 year old female with PMH of Cyclic vomiting syndrome, uncontrolled HTN, DM-2, and CKD who was admitted for persistent nausea and vomiting and reports of hematemesis.  # Cyclic vomiting syndrome - likely exacerbated by frequent marijuana use. - Patient treated with IV fluids, Ativan, Phenergan, Zofran, Reglan and Dilaudid for pain. - Given reports of hematemesis and multiple admissions for this in the past, GI was consulted during admission.  They recommended cessation of marijuana use, PPI, and limitation/cessation of narcotic use. - Patient was placed on Protonix and nausea/vomiting subsided with above therapy.  Diet was slowly advanced. - She was well appearing and tolerating full liquid diet prior to discharge.  # Hypertensive urgency - BP elevated at 215/116 on admission. - Patient was treated with PRN Hydralazine.  Home Coreg 25 mg BID and Lisinopril 40 mg daily was continued (Lisinopril was initially held for concern for AKI) - BP improved but was still elevated at discharge. Patient has had numerous admissions and has difficult to control BP during hospitalization.  # Uncontrolled Diabetes mellitus with CKD - stage 3 - Creatinine was monitor closely during admission and was below patient's baseline of 1.7-1.9. - Diabetes was controlled with SSI during  hospitalization.  # Depression  - Effexor was held during admission.  # GERD - Placed on Protonix during admission.   Significant Labs and Imaging:   CBC BMET   Recent Labs Lab 03/21/13 1330 03/22/13 0637  WBC 8.5 8.9  HGB 12.2 11.3*  HCT 34.3* 33.1*  PLT 223 230    Recent Labs Lab 03/21/13 1330 03/22/13 0637  NA 141 143  K 3.9 3.5  CL 103 109  CO2 25 23  BUN 28* 24*  CREATININE 1.60* 1.59*  GLUCOSE 212* 103*  CALCIUM 10.0 9.4     Drugs of Abuse     Component Value Date/Time   LABOPIA NONE DETECTED 03/23/2013 0853   LABOPIA  Value: POSITIVE (NOTE) Result repeated and verified. Sent for confirmatory testing* 02/15/2011 [place] 03/23/2013 0853   COCAINSCRNUR NEGATIVE 02/15/2011 0537   LABBENZ NONE DETECTED 03/23/2013 0853   LABBENZ NEGATIVE 02/15/2011 0537   AMPHETMU NONE DETECTED 03/23/2013 0853   AMPHETMU NEGATIVE 02/15/2011 0537   THCU POSITIVE* 03/23/2013 0853   LABBARB POSITIVE* 03/23/2013 0853   Procedures: None  Consultations: GI, Scarlette Shorts  Discharge Medications:    Medication List    TAKE these medications       aspirin EC 81 MG tablet  Take 81 mg by mouth daily.     BD PEN NEEDLE NANO U/F 32G X 4 MM Misc  Generic drug:  Insulin Pen Needle     carvedilol 25 MG tablet  Commonly known as:  COREG  Take 1 tablet (25 mg total) by mouth 2 (two) times daily with a meal.     diclofenac sodium 1 % Gel  Commonly known as:  VOLTAREN  Apply 2 g  topically 4 (four) times daily as needed (pain).     EPIPEN 0.3 mg/0.3 mL Devi  Generic drug:  EPINEPHrine  Inject 0.3 mg into the muscle once as needed (for allegic reactions).     glucose monitoring kit monitoring kit  1 each by Does not apply route as needed for other. Please provide most cost affordable device, strips, Lancets, and other necessary supplies to help check her sugars.     insulin glargine 100 UNIT/ML injection  Commonly known as:  LANTUS  Inject 20 Units into the  skin daily.     Insulin Syringe-Needle U-100 31G X 5/16" 0.3 ML Misc  Commonly known as:  PRODIGY INSULIN SYRINGE  For Lantus     lisinopril 40 MG tablet  Commonly known as:  PRINIVIL,ZESTRIL  Take 1 tablet (40 mg total) by mouth daily.     ondansetron 4 MG tablet  Commonly known as:  ZOFRAN  Take 1 tablet (4 mg total) by mouth every 8 (eight) hours as needed for nausea.     Oxycodone HCl 10 MG Tabs  Take 1 tablet (10 mg total) by mouth 3 (three) times daily as needed.     Oxycodone HCl 10 MG Tabs  Take 10 mg by mouth 3 (three) times daily as needed (pain).     pantoprazole 40 MG tablet  Commonly known as:  PROTONIX  Take 1 tablet (40 mg total) by mouth daily.     promethazine 25 MG tablet  Commonly known as:  PHENERGAN  Take 1 tablet (25 mg total) by mouth every 6 (six) hours as needed for nausea.     sucralfate 1 G tablet  Commonly known as:  CARAFATE  Take 1 g by mouth 4 (four) times daily as needed (stomach upset).     venlafaxine 37.5 MG tablet  Commonly known as:  EFFEXOR  Take 1 tablet (37.5 mg total) by mouth 2 (two) times daily.       Issues for Follow Up:  1) Cessation of marijuana use. 2) Continued compliance with PPI 3) Per GI recommendations, decrease/cease narcotic use.  Outstanding Results: None  Discharge Instructions:  Patient was counseled important signs and symptoms that should prompt return to medical care, changes in medications, dietary instructions, activity restrictions, and follow up appointments.   Follow-up Information   Follow up with [facility] PARK, [name], MD On 03/30/2013. (11am)    Contact information:   [place] [address] [phone]       Discharge Condition: Stable. Discharged home.  Thersa Salt, DO 03/23/2013, 11:48 AM

## 2013-03-30 ENCOUNTER — Encounter: Payer: Self-pay | Admitting: Family Medicine

## 2013-03-30 ENCOUNTER — Ambulatory Visit (HOSPITAL_COMMUNITY)
Admission: RE | Admit: 2013-03-30 | Discharge: 2013-03-30 | Disposition: A | Discharge status: Home / Self Care | Payer: Medicare Other | Source: Ambulatory Visit | Attending: Family Medicine | Admitting: Family Medicine

## 2013-03-30 ENCOUNTER — Ambulatory Visit (INDEPENDENT_AMBULATORY_CARE_PROVIDER_SITE_OTHER): Payer: Medicare Other | Admitting: Family Medicine

## 2013-03-30 VITALS — BP 165/76 | HR 72 | Temp 98.3°F | Ht 61.0 in | Wt 166.0 lb

## 2013-03-30 DIAGNOSIS — N183 Chronic kidney disease, stage 3 unspecified: Secondary | ICD-10-CM

## 2013-03-30 DIAGNOSIS — E1329 Other specified diabetes mellitus with other diabetic kidney complication: Secondary | ICD-10-CM

## 2013-03-30 DIAGNOSIS — R1115 Cyclical vomiting syndrome unrelated to migraine: Secondary | ICD-10-CM | POA: Diagnosis not present

## 2013-03-30 DIAGNOSIS — R9431 Abnormal electrocardiogram [ECG] [EKG]: Secondary | ICD-10-CM | POA: Diagnosis not present

## 2013-03-30 DIAGNOSIS — E1365 Other specified diabetes mellitus with hyperglycemia: Secondary | ICD-10-CM

## 2013-03-30 DIAGNOSIS — R079 Chest pain, unspecified: Secondary | ICD-10-CM | POA: Diagnosis not present

## 2013-03-30 NOTE — Progress Notes (Signed)
  Subjective:    Patient ID: [name] [name], female    DOB: 02/26/1966, 47 y.o.   MRN: [id number]  HPI # Diabetes, on insulin  Glucometer picked up yesterday   Sugar while eating dinner was 186 She needs more Lantus. She last took yesterday.   # Cyclical vomiting syndrome, hospital follow-up She is no longer taking oxycodone. She had been taking 2-3 tablets a day.   She was discharged from the hospital since Friday 03/28 She has not smoked marijuana since then.  She is taking Ativan, Phenergan 1-2 times a day.   Exacerbated by: stressors, infection, excitement   Review of Systems Per HPI  Allergies, medication, past medical history reviewed.  Smoking status noted.     Objective:   Physical Exam GEN: NAD PSYCH: pleasant; alert and oriented, awake ABD: soft, NT, ND    Assessment & Plan:

## 2013-03-30 NOTE — Assessment & Plan Note (Addendum)
She is no longer taking oxycodone. I commended her for refraining for this medication.  Continue Ativan, Phenergan 1-2 tablets daily for now.  Advised trying Protonix. She is hesitant due to concern this medication exacerbates her symptoms. We discussed her diagnosis of gastritis/esophagitis; she will try and see.  Glycemic control. See below.  Follow-up in 1 week.

## 2013-03-30 NOTE — Patient Instructions (Signed)
Follow-up in 1 week  Ways to get your vomiting under control: # 1 Medications: -Ativan: 1-2 times a day as needed -Phenergan: 1-2 times a day as needed -Try Protonix tonight   # 2 Do not smoke marijuana.  # 3 Good diabetes control.  Lantus 20 units in the morning  Please come back today to go over insulin. Nicoletta Ba, pharmacy resident [facility]).   # 4 Keep a food diary Everything you eat write it down

## 2013-03-30 NOTE — Assessment & Plan Note (Signed)
She now has glucometer. She will hopefully come back today so pharmacy student can teach her proper way to use Patient advised to keep diet long, sugar log.  Continue Lantus 20.  We will follow-up in 1 week.

## 2013-03-31 ENCOUNTER — Telehealth: Payer: Self-pay | Admitting: Family Medicine

## 2013-03-31 NOTE — Telephone Encounter (Signed)
Will you please call patient and have her follow-up with me next week?

## 2013-04-02 ENCOUNTER — Other Ambulatory Visit: Payer: Self-pay | Admitting: Family Medicine

## 2013-04-02 MED ORDER — INSULIN GLARGINE 100 UNIT/ML ~~LOC~~ SOLN: 20.0000 [IU] | Freq: Every day | SUBCUTANEOUS | Status: DC

## 2013-04-05 ENCOUNTER — Emergency Department (HOSPITAL_COMMUNITY): Payer: Medicare Other

## 2013-04-05 ENCOUNTER — Emergency Department (HOSPITAL_COMMUNITY)
Admission: EM | Admit: 2013-04-05 | Discharge: 2013-04-05 | Disposition: A | Discharge status: Home / Self Care | Payer: Medicare Other | Attending: Emergency Medicine | Admitting: Emergency Medicine

## 2013-04-05 ENCOUNTER — Encounter (HOSPITAL_COMMUNITY): Payer: Self-pay | Admitting: Neurology

## 2013-04-05 DIAGNOSIS — F3289 Other specified depressive episodes: Secondary | ICD-10-CM | POA: Insufficient documentation

## 2013-04-05 DIAGNOSIS — R011 Cardiac murmur, unspecified: Secondary | ICD-10-CM | POA: Insufficient documentation

## 2013-04-05 DIAGNOSIS — R1115 Cyclical vomiting syndrome unrelated to migraine: Secondary | ICD-10-CM | POA: Diagnosis not present

## 2013-04-05 DIAGNOSIS — Z7982 Long term (current) use of aspirin: Secondary | ICD-10-CM | POA: Diagnosis not present

## 2013-04-05 DIAGNOSIS — Z79899 Other long term (current) drug therapy: Secondary | ICD-10-CM | POA: Insufficient documentation

## 2013-04-05 DIAGNOSIS — Z90710 Acquired absence of both cervix and uterus: Secondary | ICD-10-CM | POA: Diagnosis not present

## 2013-04-05 DIAGNOSIS — Z8719 Personal history of other diseases of the digestive system: Secondary | ICD-10-CM | POA: Insufficient documentation

## 2013-04-05 DIAGNOSIS — Z794 Long term (current) use of insulin: Secondary | ICD-10-CM | POA: Insufficient documentation

## 2013-04-05 DIAGNOSIS — Z9889 Other specified postprocedural states: Secondary | ICD-10-CM | POA: Diagnosis not present

## 2013-04-05 DIAGNOSIS — Z8701 Personal history of pneumonia (recurrent): Secondary | ICD-10-CM | POA: Insufficient documentation

## 2013-04-05 DIAGNOSIS — K219 Gastro-esophageal reflux disease without esophagitis: Secondary | ICD-10-CM | POA: Diagnosis not present

## 2013-04-05 DIAGNOSIS — E1142 Type 2 diabetes mellitus with diabetic polyneuropathy: Secondary | ICD-10-CM | POA: Insufficient documentation

## 2013-04-05 DIAGNOSIS — Z9089 Acquired absence of other organs: Secondary | ICD-10-CM | POA: Insufficient documentation

## 2013-04-05 DIAGNOSIS — F17200 Nicotine dependence, unspecified, uncomplicated: Secondary | ICD-10-CM | POA: Insufficient documentation

## 2013-04-05 DIAGNOSIS — I10 Essential (primary) hypertension: Secondary | ICD-10-CM | POA: Insufficient documentation

## 2013-04-05 DIAGNOSIS — R1013 Epigastric pain: Secondary | ICD-10-CM | POA: Diagnosis not present

## 2013-04-05 DIAGNOSIS — R109 Unspecified abdominal pain: Secondary | ICD-10-CM | POA: Diagnosis not present

## 2013-04-05 DIAGNOSIS — E1149 Type 2 diabetes mellitus with other diabetic neurological complication: Secondary | ICD-10-CM | POA: Insufficient documentation

## 2013-04-05 DIAGNOSIS — Z862 Personal history of diseases of the blood and blood-forming organs and certain disorders involving the immune mechanism: Secondary | ICD-10-CM | POA: Insufficient documentation

## 2013-04-05 DIAGNOSIS — R1110 Vomiting, unspecified: Secondary | ICD-10-CM | POA: Diagnosis not present

## 2013-04-05 DIAGNOSIS — F411 Generalized anxiety disorder: Secondary | ICD-10-CM | POA: Insufficient documentation

## 2013-04-05 DIAGNOSIS — R0602 Shortness of breath: Secondary | ICD-10-CM | POA: Diagnosis not present

## 2013-04-05 DIAGNOSIS — Z3202 Encounter for pregnancy test, result negative: Secondary | ICD-10-CM | POA: Diagnosis not present

## 2013-04-05 DIAGNOSIS — R5381 Other malaise: Secondary | ICD-10-CM | POA: Insufficient documentation

## 2013-04-05 DIAGNOSIS — K3184 Gastroparesis: Secondary | ICD-10-CM | POA: Diagnosis not present

## 2013-04-05 DIAGNOSIS — I252 Old myocardial infarction: Secondary | ICD-10-CM | POA: Diagnosis not present

## 2013-04-05 DIAGNOSIS — R112 Nausea with vomiting, unspecified: Secondary | ICD-10-CM | POA: Diagnosis not present

## 2013-04-05 DIAGNOSIS — F329 Major depressive disorder, single episode, unspecified: Secondary | ICD-10-CM | POA: Insufficient documentation

## 2013-04-05 LAB — URINALYSIS, ROUTINE W REFLEX MICROSCOPIC
Leukocytes, UA: NEGATIVE
Nitrite: NEGATIVE
Specific Gravity, Urine: 1.012 (ref 1.005–1.030)
pH: 7 (ref 5.0–8.0)

## 2013-04-05 LAB — POCT PREGNANCY, URINE: Preg Test, Ur: NEGATIVE

## 2013-04-05 LAB — CBC WITH DIFFERENTIAL/PLATELET
Eosinophils Absolute: 0.1 10*3/uL (ref 0.0–0.7)
Eosinophils Relative: 2 % (ref 0–5)
Hemoglobin: 10.6 g/dL — ABNORMAL LOW (ref 12.0–15.0)
Lymphs Abs: 2 10*3/uL (ref 0.7–4.0)
MCH: 31.5 pg (ref 26.0–34.0)
MCV: 90.5 fL (ref 78.0–100.0)
Monocytes Relative: 5 % (ref 3–12)
Platelets: 202 10*3/uL (ref 150–400)
RBC: 3.37 MIL/uL — ABNORMAL LOW (ref 3.87–5.11)

## 2013-04-05 LAB — COMPREHENSIVE METABOLIC PANEL
BUN: 26 mg/dL — ABNORMAL HIGH (ref 6–23)
Calcium: 9.6 mg/dL (ref 8.4–10.5)
GFR calc Af Amer: 36 mL/min — ABNORMAL LOW (ref >90)
Glucose, Bld: 286 mg/dL — ABNORMAL HIGH (ref 70–99)
Total Protein: 7.1 g/dL (ref 6.0–8.3)

## 2013-04-05 LAB — URINE MICROSCOPIC-ADD ON

## 2013-04-05 LAB — PREGNANCY, URINE: Preg Test, Ur: NEGATIVE

## 2013-04-05 LAB — GLUCOSE, CAPILLARY: Glucose-Capillary: 228 mg/dL — ABNORMAL HIGH (ref 70–99)

## 2013-04-05 MED ORDER — INSULIN ASPART 100 UNIT/ML ~~LOC~~ SOLN
10.0000 [IU] | Freq: Once | SUBCUTANEOUS | Status: AC
  Administered 2013-04-05: 10 [IU] via SUBCUTANEOUS
  Filled 2013-04-05 (×2): qty 1

## 2013-04-05 MED ORDER — SODIUM CHLORIDE 0.9 % IV SOLN
Freq: Once | INTRAVENOUS | Status: AC
  Administered 2013-04-05: 10:00:00 via INTRAVENOUS

## 2013-04-05 MED ORDER — HEPARIN SOD (PORK) LOCK FLUSH 100 UNIT/ML IV SOLN
500.0000 [IU] | INTRAVENOUS | Status: AC | PRN
  Administered 2013-04-05: 500 [IU]

## 2013-04-05 MED ORDER — SODIUM CHLORIDE 0.9 % IJ SOLN
10.0000 mL | INTRAMUSCULAR | Status: DC | PRN
  Administered 2013-04-05: 10 mL

## 2013-04-05 MED ORDER — LORAZEPAM 2 MG/ML IJ SOLN
1.0000 mg | Freq: Once | INTRAMUSCULAR | Status: AC
  Administered 2013-04-05: 1 mg via INTRAVENOUS
  Filled 2013-04-05: qty 1

## 2013-04-05 MED ORDER — INSULIN ASPART 100 UNIT/ML ~~LOC~~ SOLN
50.0000 [IU] | Freq: Once | SUBCUTANEOUS | Status: AC
  Administered 2013-04-05: 10 [IU] via SUBCUTANEOUS
  Filled 2013-04-05: qty 1

## 2013-04-05 MED ORDER — INSULIN GLARGINE 100 UNIT/ML ~~LOC~~ SOLN: 100.0000 [IU] | Freq: Once | SUBCUTANEOUS | Status: DC

## 2013-04-05 MED ORDER — PROMETHAZINE HCL 25 MG/ML IJ SOLN: 25.0000 mg | Freq: Once | INTRAMUSCULAR | Status: DC

## 2013-04-05 MED ORDER — SODIUM CHLORIDE 0.9 % IV BOLUS (SEPSIS)
500.0000 mL | Freq: Once | INTRAVENOUS | Status: AC
  Administered 2013-04-05: 500 mL via INTRAVENOUS

## 2013-04-05 MED ORDER — HYDROMORPHONE HCL PF 1 MG/ML IJ SOLN: 1.0000 mg | Freq: Once | INTRAMUSCULAR | Status: DC

## 2013-04-05 MED ORDER — HYDROMORPHONE HCL PF 1 MG/ML IJ SOLN
1.0000 mg | INTRAMUSCULAR | Status: DC | PRN
  Administered 2013-04-05 (×2): 1 mg via INTRAVENOUS
  Filled 2013-04-05 (×2): qty 1

## 2013-04-05 MED ORDER — PROMETHAZINE HCL 25 MG/ML IJ SOLN
25.0000 mg | INTRAMUSCULAR | Status: DC | PRN
  Administered 2013-04-05: 25 mg via INTRAVENOUS
  Filled 2013-04-05 (×2): qty 1

## 2013-04-05 NOTE — ED Provider Notes (Signed)
History     CSN: [id number]  Arrival date & time 04/05/13  0740   First MD Initiated Contact with Patient 04/05/13 [phone]      Chief Complaint  Patient presents with  . Emesis    (Consider location/radiation/quality/duration/timing/severity/associated sxs/prior treatment) HPI  Is a 47 year old female past medical history significant for cyclical vomiting syndrome, anemia, and pertinent surgical history including abdominal hysterectomy and laparotomy and lysis of adhesions presenting with one episode of nonbilious nonbloody emesis and epigastric pain started this morning. The pain as dull achy and constant without radiation. States this feels like the start of an episode of cyclic vomiting. Tried Phenergan and Ativan this morning without relief. Denies fevers, chills, diarrhea, chest pain, shortness of breath, vaginal discharge, vaginal pain. Last menstrual period 11 years ago status post hysterectomy.  Past Medical History  Diagnosis Date  . PANCREATITIS 05/09/2008  . NEUROPATHY, DIABETIC 02/23/2007  . Cyclic vomiting syndrome   . Hypertension   . Complication of anesthesia     "problems waking up"  . Heart murmur   . Myocardial infarction 07/2007    "they say I've had a silent one; I don't know"  . Pneumonia   . Type II diabetes mellitus   . Anemia   . Blood transfusion   . GERD (gastroesophageal reflux disease)   . Anxiety   . Depression   . Diabetic gastroparesis     /e-chart  . Smoker   . Marijuana smoker, continuous     Past Surgical History  Procedure Laterality Date  . Port-a-cath placement  ~ 2008    right chest; "poor access; frequent hospitalizations"  . Laparotomy and lysis of adhesions    . Abdominal hysterectomy  02/2002  . Cholecystectomy  1980's  . Cardiac catheterization    . Dilation and evacuation  X3  . Left hand surgery    . Cataract extraction w/ intraocular lens  implant, bilateral    . Colonoscopy    . Upper gi endoscopy      Family History   Problem Relation Age of Onset  . Diabetes type II Mother   . Diabetes Mother   . Hypertension Mother   . Diabetes type II Sister   . Diabetes Sister   . Hypertension Sister   . Hypertension Brother     History  Substance Use Topics  . Smoking status: Current Some Day Smoker -- 0.50 packs/day for 22 years    Types: Cigarettes    Last Attempt to Quit: 02/22/2012  . Smokeless tobacco: Never Used  . Alcohol Use: No    OB History   Grav Para Term Preterm Abortions TAB SAB Ect Mult Living                  Review of Systems  Constitutional: Positive for fatigue. Negative for fever and chills.  HENT: Negative.   Eyes: Negative for visual disturbance.  Respiratory: Negative for shortness of breath.   Cardiovascular: Negative for chest pain.  Gastrointestinal: Positive for nausea, vomiting and abdominal pain. Negative for diarrhea, constipation, blood in stool, abdominal distention, anal bleeding and rectal pain.  Genitourinary: Negative for dysuria.  Musculoskeletal: Negative.   Skin: Negative.   Neurological: Negative for headaches.  Psychiatric/Behavioral: The patient is nervous/anxious.     Allergies  Erythromycin; Acetaminophen; and Oxycodone-acetaminophen  Home Medications   Current Outpatient Rx  Name  Route  Sig  Dispense  Refill  . aspirin EC 81 MG tablet   Oral   Take  81 mg by mouth daily.         . BD PEN NEEDLE NANO U/F 32G X 4 MM MISC               . carvedilol (COREG) 25 MG tablet   Oral   Take 1 tablet (25 mg total) by mouth 2 (two) times daily with a meal.   60 tablet   11   . diclofenac sodium (VOLTAREN) 1 % GEL   Topical   Apply 2 g topically 4 (four) times daily as needed (pain).         Marland Kitchen EPINEPHrine (EPIPEN) 0.3 mg/0.3 mL DEVI   Intramuscular   Inject 0.3 mg into the muscle once as needed (for allegic reactions).         Marland Kitchen glucose monitoring kit (FREESTYLE) monitoring kit   Does not apply   1 each by Does not apply route as  needed for other. Please provide most cost affordable device, strips, Lancets, and other necessary supplies to help check her sugars.   1 each   0   . insulin glargine (LANTUS) 100 UNIT/ML injection   Subcutaneous   Inject 0.2 mLs (20 Units total) into the skin daily.   10 mL   2   . Insulin Syringe-Needle U-100 (PRODIGY INSULIN SYRINGE) 31G X 5/16" 0.3 ML MISC      For Lantus   100 each   6   . lisinopril (PRINIVIL,ZESTRIL) 40 MG tablet   Oral   Take 1 tablet (40 mg total) by mouth daily.   30 tablet   11   . ondansetron (ZOFRAN) 4 MG tablet   Oral   Take 1 tablet (4 mg total) by mouth every 8 (eight) hours as needed for nausea.   30 tablet   1   . promethazine (PHENERGAN) 25 MG tablet   Oral   Take 1 tablet (25 mg total) by mouth every 6 (six) hours as needed for nausea.   30 tablet   1   . sucralfate (CARAFATE) 1 G tablet   Oral   Take 1 g by mouth 4 (four) times daily as needed (stomach upset).         . venlafaxine (EFFEXOR) 37.5 MG tablet   Oral   Take 1 tablet (37.5 mg total) by mouth 2 (two) times daily.   60 tablet   0     BP 161/60  Pulse 81  Temp(Src) 98.7 F (37.1 C) (Oral)  Resp 20  SpO2 100%  Physical Exam  Constitutional: She is oriented to person, place, and time. She appears well-developed and well-nourished. No distress.  HENT:  Head: Normocephalic and atraumatic.  Eyes: Conjunctivae are normal.  Neck: Normal range of motion. Neck supple.  Cardiovascular: Normal rate, regular rhythm and normal heart sounds.   Pulmonary/Chest: Effort normal and breath sounds normal. No respiratory distress. She has no wheezes.  Abdominal: Soft. Bowel sounds are normal. She exhibits no distension and no mass. There is no hepatosplenomegaly. There is tenderness in the epigastric area. There is rebound. There is no rigidity, no guarding, no CVA tenderness, no tenderness at McBurney's point and negative Murphy's sign.  Neurological: She is alert and  oriented to person, place, and time.  Skin: Skin is warm and dry. She is not diaphoretic.    ED Course  Procedures (including critical care time)  Patient symptoms improving with the first round of pain medication and antiemetics and fluids. Abdominal exam soft nontender nondistended  after pain and nausea control.  Medications  HYDROmorphone (DILAUDID) injection 1 mg (1 mg Intravenous Given 04/05/13 0918)  promethazine (PHENERGAN) injection 25 mg (25 mg Intravenous Given 04/05/13 0914)  0.9 %  sodium chloride infusion (not administered)  LORazepam (ATIVAN) injection 1 mg (1 mg Intravenous Given 04/05/13 0911)  sodium chloride 0.9 % bolus 500 mL (500 mLs Intravenous New Bag/Given 04/05/13 0954)     Labs Reviewed  CBC WITH DIFFERENTIAL - Abnormal; Notable for the following:    RBC 3.37 (*)    Hemoglobin 10.6 (*)    HCT 30.5 (*)    All other components within normal limits  COMPREHENSIVE METABOLIC PANEL - Abnormal; Notable for the following:    Glucose, Bld 286 (*)    BUN 26 (*)    Creatinine, Ser 1.85 (*)    Total Bilirubin 0.2 (*)    GFR calc non Af Amer 31 (*)    GFR calc Af Amer 36 (*)    All other components within normal limits  LIPASE, BLOOD  URINALYSIS, ROUTINE W REFLEX MICROSCOPIC  PREGNANCY, URINE  POCT PREGNANCY, URINE   No results found.   1. Cyclic vomiting syndrome       MDM  Patient is a 47 year old female past medical history significant for cyclical vomiting   cycle and poorly controlled diabetes mellitus presenting to ED with nonbloody nonbilious emesis x1 and epigastric pain similar to previous cyclic vomiting cycle episodes. Patient had a soft nondistended mildly tender in the epigastric region on physical exam prior to pain, nausea control. After receiving normal cyclic vomiting cycle cocktail patient had soft nontender nondistended abdomen, nausea and vomiting resolved. Patient had mildly elevated glucose without signs of DKA on physical exam and  laboratory results given 20 units short acting insulin and IV fluid hydration. Patient typically has high blood glucose as poorly controlled diabetic. Advised to followup with PCP for further management on diabetic control. Rest of lab were unremarkable, patient's BUN and creatinine at baseline from previous ED visits also possibly due to mild dehydration. Patient able to tolerate by mouth fluids and food. Patient advised to followup with PCP in one to 2 days for further evaluation after ED visit. Patient advised to use home medications prescribed by PCP for cyclical vomiting cycle symptoms and continue to have PCP manage the medications Patient agreeable to plan. Discussed patient care with Dr. Vanita Panda. Patient stable at time of discharge.       Harlow Mares, PA-C 04/05/13 1438

## 2013-04-05 NOTE — ED Notes (Signed)
Pt states vomiting x 1 this morning and epigastric pain. Pt crying, anxious. States took phenergan, ativan this morning has cyclic vomiting.

## 2013-04-05 NOTE — ED Provider Notes (Signed)
  Medical screening examination/treatment/procedure(s) were performed by non-physician practitioner and as supervising physician I was immediately available for consultation/collaboration.  On my exam the patient was in no distress.  The patient improved substantially throughout her emergency department course  I saw the ECG (if appropriate), relevant labs and studies - I agree with the interpretation.    Carmin Muskrat, MD 04/05/13 1555

## 2013-04-05 NOTE — ED Notes (Signed)
Paged IV team to access port a cath.

## 2013-04-11 ENCOUNTER — Other Ambulatory Visit: Payer: Self-pay | Admitting: *Deleted

## 2013-04-12 ENCOUNTER — Telehealth: Payer: Self-pay | Admitting: Family Medicine

## 2013-04-12 MED ORDER — PROMETHAZINE HCL 25 MG PO TABS: 25.0000 mg | ORAL_TABLET | Freq: Four times a day (QID) | ORAL | Status: DC | PRN

## 2013-04-12 NOTE — Telephone Encounter (Signed)
Asked her to please schedule follow-up with me next week regarding cyclical vomiting. She had been to the ED recently for this.

## 2013-04-17 ENCOUNTER — Encounter (HOSPITAL_COMMUNITY): Payer: Self-pay | Admitting: Emergency Medicine

## 2013-04-17 ENCOUNTER — Emergency Department (HOSPITAL_COMMUNITY): Payer: Medicare Other

## 2013-04-17 ENCOUNTER — Emergency Department (HOSPITAL_COMMUNITY)
Admission: EM | Admit: 2013-04-17 | Discharge: 2013-04-17 | Disposition: A | Discharge status: Home / Self Care | Payer: Medicare Other | Attending: Emergency Medicine | Admitting: Emergency Medicine

## 2013-04-17 DIAGNOSIS — Z791 Long term (current) use of non-steroidal anti-inflammatories (NSAID): Secondary | ICD-10-CM | POA: Insufficient documentation

## 2013-04-17 DIAGNOSIS — E1142 Type 2 diabetes mellitus with diabetic polyneuropathy: Secondary | ICD-10-CM | POA: Insufficient documentation

## 2013-04-17 DIAGNOSIS — Z862 Personal history of diseases of the blood and blood-forming organs and certain disorders involving the immune mechanism: Secondary | ICD-10-CM | POA: Insufficient documentation

## 2013-04-17 DIAGNOSIS — F17200 Nicotine dependence, unspecified, uncomplicated: Secondary | ICD-10-CM | POA: Insufficient documentation

## 2013-04-17 DIAGNOSIS — R51 Headache: Secondary | ICD-10-CM | POA: Insufficient documentation

## 2013-04-17 DIAGNOSIS — K2970 Gastritis, unspecified, without bleeding: Secondary | ICD-10-CM | POA: Diagnosis not present

## 2013-04-17 DIAGNOSIS — Z7982 Long term (current) use of aspirin: Secondary | ICD-10-CM | POA: Diagnosis not present

## 2013-04-17 DIAGNOSIS — Z8701 Personal history of pneumonia (recurrent): Secondary | ICD-10-CM | POA: Insufficient documentation

## 2013-04-17 DIAGNOSIS — E1149 Type 2 diabetes mellitus with other diabetic neurological complication: Secondary | ICD-10-CM | POA: Diagnosis not present

## 2013-04-17 DIAGNOSIS — K219 Gastro-esophageal reflux disease without esophagitis: Secondary | ICD-10-CM | POA: Diagnosis not present

## 2013-04-17 DIAGNOSIS — R112 Nausea with vomiting, unspecified: Secondary | ICD-10-CM | POA: Diagnosis not present

## 2013-04-17 DIAGNOSIS — Z794 Long term (current) use of insulin: Secondary | ICD-10-CM | POA: Diagnosis not present

## 2013-04-17 DIAGNOSIS — Z79899 Other long term (current) drug therapy: Secondary | ICD-10-CM | POA: Insufficient documentation

## 2013-04-17 DIAGNOSIS — F329 Major depressive disorder, single episode, unspecified: Secondary | ICD-10-CM | POA: Insufficient documentation

## 2013-04-17 DIAGNOSIS — F411 Generalized anxiety disorder: Secondary | ICD-10-CM | POA: Insufficient documentation

## 2013-04-17 DIAGNOSIS — J029 Acute pharyngitis, unspecified: Secondary | ICD-10-CM | POA: Diagnosis not present

## 2013-04-17 DIAGNOSIS — R509 Fever, unspecified: Secondary | ICD-10-CM | POA: Diagnosis not present

## 2013-04-17 DIAGNOSIS — I10 Essential (primary) hypertension: Secondary | ICD-10-CM | POA: Diagnosis not present

## 2013-04-17 DIAGNOSIS — R109 Unspecified abdominal pain: Secondary | ICD-10-CM | POA: Diagnosis not present

## 2013-04-17 DIAGNOSIS — R011 Cardiac murmur, unspecified: Secondary | ICD-10-CM | POA: Insufficient documentation

## 2013-04-17 DIAGNOSIS — F3289 Other specified depressive episodes: Secondary | ICD-10-CM | POA: Insufficient documentation

## 2013-04-17 DIAGNOSIS — R5383 Other fatigue: Secondary | ICD-10-CM | POA: Insufficient documentation

## 2013-04-17 DIAGNOSIS — I252 Old myocardial infarction: Secondary | ICD-10-CM | POA: Insufficient documentation

## 2013-04-17 DIAGNOSIS — Z8719 Personal history of other diseases of the digestive system: Secondary | ICD-10-CM | POA: Insufficient documentation

## 2013-04-17 DIAGNOSIS — Z90710 Acquired absence of both cervix and uterus: Secondary | ICD-10-CM | POA: Insufficient documentation

## 2013-04-17 DIAGNOSIS — K3184 Gastroparesis: Secondary | ICD-10-CM | POA: Diagnosis not present

## 2013-04-17 DIAGNOSIS — R5381 Other malaise: Secondary | ICD-10-CM | POA: Insufficient documentation

## 2013-04-17 LAB — CBC WITH DIFFERENTIAL/PLATELET
Eosinophils Relative: 0 % (ref 0–5)
HCT: 33.9 % — ABNORMAL LOW (ref 36.0–46.0)
Hemoglobin: 12.1 g/dL (ref 12.0–15.0)
Lymphocytes Relative: 13 % (ref 12–46)
Lymphs Abs: 1.7 10*3/uL (ref 0.7–4.0)
MCV: 88.1 fL (ref 78.0–100.0)
Monocytes Absolute: 1.1 10*3/uL — ABNORMAL HIGH (ref 0.1–1.0)
Monocytes Relative: 8 % (ref 3–12)
Neutro Abs: 10.1 10*3/uL — ABNORMAL HIGH (ref 1.7–7.7)
RBC: 3.85 MIL/uL — ABNORMAL LOW (ref 3.87–5.11)
WBC: 12.9 10*3/uL — ABNORMAL HIGH (ref 4.0–10.5)

## 2013-04-17 LAB — COMPREHENSIVE METABOLIC PANEL
AST: 18 U/L (ref 0–37)
CO2: 25 mEq/L (ref 19–32)
Calcium: 10.2 mg/dL (ref 8.4–10.5)
Chloride: 96 mEq/L (ref 96–112)
Creatinine, Ser: 1.97 mg/dL — ABNORMAL HIGH (ref 0.50–1.10)
GFR calc Af Amer: 33 mL/min — ABNORMAL LOW (ref >90)
GFR calc non Af Amer: 29 mL/min — ABNORMAL LOW (ref >90)
Glucose, Bld: 365 mg/dL — ABNORMAL HIGH (ref 70–99)
Total Bilirubin: 0.5 mg/dL (ref 0.3–1.2)

## 2013-04-17 LAB — GLUCOSE, CAPILLARY

## 2013-04-17 MED ORDER — PROMETHAZINE HCL 25 MG/ML IJ SOLN
25.0000 mg | Freq: Once | INTRAMUSCULAR | Status: AC
  Administered 2013-04-17: 25 mg via INTRAVENOUS
  Filled 2013-04-17: qty 1

## 2013-04-17 MED ORDER — HYDROMORPHONE HCL PF 1 MG/ML IJ SOLN
1.0000 mg | Freq: Once | INTRAMUSCULAR | Status: AC
  Administered 2013-04-17: 1 mg via INTRAVENOUS
  Filled 2013-04-17: qty 1

## 2013-04-17 MED ORDER — HEPARIN SOD (PORK) LOCK FLUSH 100 UNIT/ML IV SOLN
500.0000 [IU] | INTRAVENOUS | Status: AC | PRN
  Administered 2013-04-17: 500 [IU]

## 2013-04-17 MED ORDER — SODIUM CHLORIDE 0.9 % IV BOLUS (SEPSIS)
500.0000 mL | Freq: Once | INTRAVENOUS | Status: AC
  Administered 2013-04-17: 500 mL via INTRAVENOUS

## 2013-04-17 MED ORDER — LORAZEPAM 2 MG/ML IJ SOLN
1.0000 mg | Freq: Once | INTRAMUSCULAR | Status: AC
  Administered 2013-04-17: 1 mg via INTRAVENOUS
  Filled 2013-04-17: qty 1

## 2013-04-17 MED ORDER — SODIUM CHLORIDE 0.9 % IV SOLN
Freq: Once | INTRAVENOUS | Status: AC
  Administered 2013-04-17: 07:00:00 via INTRAVENOUS

## 2013-04-17 MED ORDER — LIDOCAINE-PRILOCAINE 2.5-2.5 % EX CREA: TOPICAL_CREAM | Freq: Once | CUTANEOUS | Status: DC

## 2013-04-17 NOTE — ED Notes (Signed)
Patient with history of cyclic vomiting syndrome, has been sick since Saturday, but started with the abdominal pain yesterday.  Pain in bilat upper quadrants.

## 2013-04-17 NOTE — ED Notes (Signed)
Rt chest port acessed per 11-7 nurse labs drawn and bolus started pt medicated

## 2013-04-17 NOTE — ED Notes (Signed)
States was in need of pain and nausea med again and then wanted ice chips. Pt medicated

## 2013-04-17 NOTE — ED Notes (Signed)
IV team here to disconnet port

## 2013-04-17 NOTE — ED Provider Notes (Signed)
Medical screening examination/treatment/procedure(s) were conducted as a shared visit with non-physician practitioner(s) and myself.  I personally evaluated the patient during the encounter  Leota Jacobsen, MD 04/17/13 1310

## 2013-04-17 NOTE — ED Provider Notes (Signed)
History     CSN: [id number]  Arrival date & time 04/17/13  0551   First MD Initiated Contact with Patient 04/17/13 [phone]      Chief Complaint  Patient presents with  . Abdominal Pain    (Consider location/radiation/quality/duration/timing/severity/associated sxs/prior treatment) HPI  Patient is a 47 yo F PMHx significant for cyclic vomiting syndrome presenting for nausea, non-bilious non-bloody vomiting since Saturday. Pt has had associated sharp epigastric pain that is brought on after emesis. Pt states this feels like previous cyclic vomiting episodes that have brought her to the ED for management. Pt is also having associated subjective fevers and chills. Denies any diarrhea, bloody stools, urinary symptoms, recent illnesses, CP, or SOB.   Past Medical History  Diagnosis Date  . PANCREATITIS 05/09/2008  . NEUROPATHY, DIABETIC 02/23/2007  . Cyclic vomiting syndrome   . Hypertension   . Complication of anesthesia     "problems waking up"  . Heart murmur   . Myocardial infarction 07/2007    "they say I've had a silent one; I don't know"  . Pneumonia   . Type II diabetes mellitus   . Anemia   . Blood transfusion   . GERD (gastroesophageal reflux disease)   . Anxiety   . Depression   . Diabetic gastroparesis     /e-chart  . Smoker   . Marijuana smoker, continuous     Past Surgical History  Procedure Laterality Date  . Port-a-cath placement  ~ 2008    right chest; "poor access; frequent hospitalizations"  . Laparotomy and lysis of adhesions    . Abdominal hysterectomy  02/2002  . Cholecystectomy  1980's  . Cardiac catheterization    . Dilation and evacuation  X3  . Left hand surgery    . Cataract extraction w/ intraocular lens  implant, bilateral    . Colonoscopy    . Upper gi endoscopy      Family History  Problem Relation Age of Onset  . Diabetes type II Mother   . Diabetes Mother   . Hypertension Mother   . Diabetes type II Sister   . Diabetes Sister   .  Hypertension Sister   . Hypertension Brother     History  Substance Use Topics  . Smoking status: Current Some Day Smoker -- 0.50 packs/day for 22 years    Types: Cigarettes    Last Attempt to Quit: 02/22/2012  . Smokeless tobacco: Never Used  . Alcohol Use: No    OB History   Grav Para Term Preterm Abortions TAB SAB Ect Mult Living                  Review of Systems  Constitutional: Positive for fever, chills and fatigue.  HENT: Positive for sore throat. Negative for neck pain.   Eyes: Negative for visual disturbance.  Respiratory: Negative for shortness of breath.   Cardiovascular: Negative for chest pain and palpitations.  Gastrointestinal: Positive for nausea, vomiting and abdominal pain. Negative for diarrhea, blood in stool and abdominal distention.  Genitourinary: Negative for dysuria, urgency and hematuria.  Skin: Negative.   Neurological: Positive for headaches.  Psychiatric/Behavioral: The patient is nervous/anxious.     Allergies  Erythromycin; Acetaminophen; and Oxycodone-acetaminophen  Home Medications   Current Outpatient Rx  Name  Route  Sig  Dispense  Refill  . aspirin EC 81 MG tablet   Oral   Take 81 mg by mouth daily.         . carvedilol (COREG)  25 MG tablet   Oral   Take 1 tablet (25 mg total) by mouth 2 (two) times daily with a meal.   60 tablet   11   . diclofenac sodium (VOLTAREN) 1 % GEL   Topical   Apply 2 g topically 4 (four) times daily as needed (pain).         Marland Kitchen EPINEPHrine (EPIPEN) 0.3 mg/0.3 mL DEVI   Intramuscular   Inject 0.3 mg into the muscle once as needed (for allegic reactions).         . insulin glargine (LANTUS) 100 UNIT/ML injection   Subcutaneous   Inject 20 Units into the skin at bedtime.         . lidocaine (LIDODERM) 5 %   Transdermal   Place 1 patch onto the skin daily. Remove & Discard patch within 12 hours or as directed by MD         . lisinopril (PRINIVIL,ZESTRIL) 40 MG tablet   Oral   Take  1 tablet (40 mg total) by mouth daily.   30 tablet   11   . LORazepam (ATIVAN) 1 MG tablet   Oral   Take 1 mg by mouth 2 (two) times daily as needed for anxiety.         . promethazine (PHENERGAN) 25 MG tablet   Oral   Take 1 tablet (25 mg total) by mouth every 6 (six) hours as needed for nausea.   30 tablet   1   . sucralfate (CARAFATE) 1 G tablet   Oral   Take 1 g by mouth 4 (four) times daily as needed (stomach upset).           BP 198/94  Pulse 98  Temp(Src) 98.4 F (36.9 C) (Oral)  Resp 24  SpO2 99%  Physical Exam  Constitutional: She is oriented to person, place, and time. She appears well-developed and well-nourished.  HENT:  Head: Normocephalic and atraumatic.  Mouth/Throat: Uvula is midline. Mucous membranes are dry.  Eyes: Conjunctivae are normal.  Neck: Normal range of motion. Neck supple.  Cardiovascular: Normal rate, regular rhythm and normal heart sounds.   Pulmonary/Chest: Effort normal and breath sounds normal. No respiratory distress.  Abdominal: Soft. Bowel sounds are normal. There is tenderness in the epigastric area.  Lymphadenopathy:    She has no cervical adenopathy.  Neurological: She is alert and oriented to person, place, and time.  Skin: Skin is warm and dry.  Psychiatric: She has a normal mood and affect.    ED Course  Procedures (including critical care time)  Medications  promethazine (PHENERGAN) injection 25 mg (25 mg Intravenous Given 04/17/13 0723)  LORazepam (ATIVAN) injection 1 mg (1 mg Intravenous Given 04/17/13 0721)  HYDROmorphone (DILAUDID) injection 1 mg (1 mg Intravenous Given 04/17/13 0722)  sodium chloride 0.9 % bolus 500 mL (500 mLs Intravenous New Bag/Given 04/17/13 0721)  0.9 %  sodium chloride infusion ( Intravenous New Bag/Given 04/17/13 0721)  promethazine (PHENERGAN) injection 25 mg (25 mg Intravenous Given 04/17/13 0910)  HYDROmorphone (DILAUDID) injection 1 mg (1 mg Intravenous Given 04/17/13 0909)     Labs  Reviewed  CBC WITH DIFFERENTIAL - Abnormal; Notable for the following:    WBC 12.9 (*)    RBC 3.85 (*)    HCT 33.9 (*)    Neutrophils Relative 78 (*)    Neutro Abs 10.1 (*)    Monocytes Absolute 1.1 (*)    All other components within normal limits  COMPREHENSIVE METABOLIC  PANEL - Abnormal; Notable for the following:    Potassium 3.4 (*)    Glucose, Bld 365 (*)    BUN 28 (*)    Creatinine, Ser 1.97 (*)    GFR calc non Af Amer 29 (*)    GFR calc Af Amer 33 (*)    All other components within normal limits  GLUCOSE, CAPILLARY - Abnormal; Notable for the following:    Glucose-Capillary 304 (*)    All other components within normal limits  URINALYSIS, ROUTINE W REFLEX MICROSCOPIC    Dg Abd Acute W/chest  04/17/2013  *RADIOLOGY REPORT*  Clinical Data: Nausea, vomiting and abdominal pain.  ACUTE ABDOMEN SERIES (ABDOMEN 2 VIEW & CHEST 1 VIEW)  Comparison: 04/05/2013  Findings: Chest x-ray shows clear lungs.  Port-A-Cath in stable position.  Abdominal films show no evidence of bowel obstruction, ileus or free intraperitoneal air.  Clips are present related to prior cholecystectomy.  No abnormal calcifications are seen. Visualized bony structures are unremarkable.  IMPRESSION: No acute findings.   Original Report Authenticated By: Aletta Edouard, M.D.    On re-examination after first round of management patients abdominal exam remains S/NT/ND w/ BS x4  1. Nausea & vomiting       MDM  Patient is a 47 year old female past medical history significant for cyclical vomiting  cycle and poorly controlled diabetes mellitus presenting to ED with nonbloody nonbilious emesis and epigastric pain since Saturday similar to previous cyclic vomiting cycle episodes. Patient had a soft nondistended mildly tender in the epigastric region on physical exam prior to pain, nausea control. After receiving normal cyclic vomiting cycle cocktail patient had soft nontender nondistended abdomen, nausea and vomiting  resolved. Patient had mildly elevated glucose without signs of DKA. Patient typically has high blood glucose as poorly controlled diabetic. Advised to followup with PCP for further management on diabetic control. Rest of lab were unremarkable, patient's BUN and creatinine at baseline from previous ED visits also possibly due to mild dehydration. Patient able to tolerate by mouth fluids and food. Pt states she feels able to go home. Patient advised to followup with PCP in one to 2 days for further evaluation after ED visit. Patient advised to use home medications prescribed by PCP for cyclical vomiting cycle symptoms and continue to have PCP manage the medications Patient agreeable to plan. Discussed patient care with Dr. Zenia Resides. Patient stable at time of discharge.            Harlow Mares, PA-C 04/17/13 1023

## 2013-04-18 ENCOUNTER — Ambulatory Visit (INDEPENDENT_AMBULATORY_CARE_PROVIDER_SITE_OTHER): Payer: Medicare Other | Admitting: Family Medicine

## 2013-04-18 VITALS — BP 189/105 | HR 98 | Temp 98.9°F | Ht 61.0 in | Wt 154.1 lb

## 2013-04-18 DIAGNOSIS — K209 Esophagitis, unspecified without bleeding: Secondary | ICD-10-CM

## 2013-04-18 DIAGNOSIS — R1115 Cyclical vomiting syndrome unrelated to migraine: Secondary | ICD-10-CM

## 2013-04-18 HISTORY — DX: Esophagitis, unspecified without bleeding: K20.90

## 2013-04-18 MED ORDER — PROMETHAZINE HCL 6.25 MG/5ML PO SYRP: 25.0000 mg | ORAL_SOLUTION | Freq: Four times a day (QID) | ORAL | Status: DC | PRN

## 2013-04-18 MED ORDER — LORAZEPAM 1 MG PO TABS: 1.0000 mg | ORAL_TABLET | Freq: Two times a day (BID) | ORAL | Status: DC | PRN

## 2013-04-18 MED ORDER — PROMETHAZINE HCL 25 MG/ML IJ SOLN
12.5000 mg | INTRAMUSCULAR | Status: DC | PRN
  Administered 2013-04-18: 12.5 mg via INTRAMUSCULAR

## 2013-04-18 NOTE — Progress Notes (Signed)
  Subjective:    Patient ID: [name] [name], female    DOB: 12-08-1966, 47 y.o.   MRN: [id number]  HPI # Chronic nausea/vomiting/abdominal pain; cyclical vomiting syndrome  Last OV 04/04. She went to the ED 04/10, 04/22 for acute exacerbations. She received Phenergan, Ativan, Dilaudid x 2 at most recent visit and discharged to home due to improvement of symptoms.    She does not feel different today than she did yesterday.  She denies diarrhea.  She does not have any Ativan, she took Phenergan yesterday but not today. The latter does not significantly help her symptoms.   She has been on PPI for 3 weeks  Glc in ED 304. She had taken insulin (Lantus) the night before.  She checks 3 times a day but not when she is sick.  Her sugars have been in the 180s mid-day, fasting sugars 120 (lows in 86).    Review of Systems Denies fevers  Allergies, medication, past medical history reviewed.  Smoking status noted. # Depression # GERD # Esophagitis # Marijuana use--she last smoked 04/19 # Chronic renal insufficiency # Poorly controlled DM     Objective:   Physical Exam Gen: NAD; well-appearing PSYCH: pleasant, engaged and normally conversant, appropriate to questions, alert and oriented CV: RRR PULM: NI WOB; CTAB without w/r/r ABD: soft, diffuse mild tenderness worse in epigastric area, without guarding or rebound, non-distended EXT: no edema SKIN: warm, dry, no rash      Assessment & Plan:

## 2013-04-18 NOTE — Patient Instructions (Signed)
Phenergan injection today  Pick up Phenergan liquid and Ativan before you go home  Take blood pressure medicine at home today  Follow-up with me next week Please bring a list of your sugar numbers and take your blood pressure medicine before you come in

## 2013-04-18 NOTE — Assessment & Plan Note (Addendum)
She has had 2 exacerbations and was seen in the ED since her last visit 04/04.  I think stress sets of exacerbations.  She is no longer on narcotics. She ran out of her Ativan.  -Rx Ativan prn, Phenergan liquid (which she feels like works better than suppositories or tablets) prn  -Sugars in ED 300s yesterday. She was advised to record sugars, times and follow-up in 1 week to discuss -Commended her on not smoking marijuana past few days  -She does not want to take omeprazole. She feels like it makes her lips itchy and makes her reflux worse. Hold. Consider trying alternate PPI with her history of esophagitis.

## 2013-04-19 ENCOUNTER — Encounter (HOSPITAL_COMMUNITY): Payer: Self-pay | Admitting: *Deleted

## 2013-04-19 ENCOUNTER — Other Ambulatory Visit: Payer: Self-pay | Admitting: *Deleted

## 2013-04-19 ENCOUNTER — Ambulatory Visit (INDEPENDENT_AMBULATORY_CARE_PROVIDER_SITE_OTHER): Payer: Medicare Other | Admitting: Family Medicine

## 2013-04-19 ENCOUNTER — Encounter: Payer: Self-pay | Admitting: Family Medicine

## 2013-04-19 ENCOUNTER — Emergency Department (HOSPITAL_COMMUNITY)
Admission: EM | Admit: 2013-04-19 | Discharge: 2013-04-19 | Disposition: A | Discharge status: Home / Self Care | Payer: Medicare Other | Attending: Emergency Medicine | Admitting: Emergency Medicine

## 2013-04-19 VITALS — BP 182/110 | HR 104 | Temp 99.1°F | Ht 61.0 in | Wt 154.0 lb

## 2013-04-19 DIAGNOSIS — I10 Essential (primary) hypertension: Secondary | ICD-10-CM | POA: Insufficient documentation

## 2013-04-19 DIAGNOSIS — R197 Diarrhea, unspecified: Secondary | ICD-10-CM | POA: Insufficient documentation

## 2013-04-19 DIAGNOSIS — I252 Old myocardial infarction: Secondary | ICD-10-CM | POA: Insufficient documentation

## 2013-04-19 DIAGNOSIS — R1013 Epigastric pain: Secondary | ICD-10-CM | POA: Diagnosis not present

## 2013-04-19 DIAGNOSIS — Z7982 Long term (current) use of aspirin: Secondary | ICD-10-CM | POA: Insufficient documentation

## 2013-04-19 DIAGNOSIS — Z794 Long term (current) use of insulin: Secondary | ICD-10-CM | POA: Insufficient documentation

## 2013-04-19 DIAGNOSIS — Z8701 Personal history of pneumonia (recurrent): Secondary | ICD-10-CM | POA: Insufficient documentation

## 2013-04-19 DIAGNOSIS — E1329 Other specified diabetes mellitus with other diabetic kidney complication: Secondary | ICD-10-CM | POA: Diagnosis not present

## 2013-04-19 DIAGNOSIS — R1115 Cyclical vomiting syndrome unrelated to migraine: Secondary | ICD-10-CM | POA: Diagnosis not present

## 2013-04-19 DIAGNOSIS — F411 Generalized anxiety disorder: Secondary | ICD-10-CM | POA: Insufficient documentation

## 2013-04-19 DIAGNOSIS — Z8719 Personal history of other diseases of the digestive system: Secondary | ICD-10-CM | POA: Insufficient documentation

## 2013-04-19 DIAGNOSIS — F17200 Nicotine dependence, unspecified, uncomplicated: Secondary | ICD-10-CM | POA: Insufficient documentation

## 2013-04-19 DIAGNOSIS — F1210 Cannabis abuse, uncomplicated: Secondary | ICD-10-CM | POA: Insufficient documentation

## 2013-04-19 DIAGNOSIS — Z79899 Other long term (current) drug therapy: Secondary | ICD-10-CM | POA: Insufficient documentation

## 2013-04-19 DIAGNOSIS — R011 Cardiac murmur, unspecified: Secondary | ICD-10-CM | POA: Insufficient documentation

## 2013-04-19 DIAGNOSIS — F3289 Other specified depressive episodes: Secondary | ICD-10-CM | POA: Insufficient documentation

## 2013-04-19 DIAGNOSIS — K219 Gastro-esophageal reflux disease without esophagitis: Secondary | ICD-10-CM | POA: Insufficient documentation

## 2013-04-19 DIAGNOSIS — F329 Major depressive disorder, single episode, unspecified: Secondary | ICD-10-CM | POA: Insufficient documentation

## 2013-04-19 DIAGNOSIS — R112 Nausea with vomiting, unspecified: Secondary | ICD-10-CM | POA: Insufficient documentation

## 2013-04-19 DIAGNOSIS — E1365 Other specified diabetes mellitus with hyperglycemia: Secondary | ICD-10-CM

## 2013-04-19 DIAGNOSIS — Z862 Personal history of diseases of the blood and blood-forming organs and certain disorders involving the immune mechanism: Secondary | ICD-10-CM | POA: Insufficient documentation

## 2013-04-19 DIAGNOSIS — E1322 Other specified diabetes mellitus with diabetic chronic kidney disease: Secondary | ICD-10-CM

## 2013-04-19 DIAGNOSIS — E119 Type 2 diabetes mellitus without complications: Secondary | ICD-10-CM | POA: Insufficient documentation

## 2013-04-19 LAB — CBC WITH DIFFERENTIAL/PLATELET
Basophils Relative: 0 % (ref 0–1)
Eosinophils Absolute: 0.1 10*3/uL (ref 0.0–0.7)
Eosinophils Relative: 1 % (ref 0–5)
Hemoglobin: 13.4 g/dL (ref 12.0–15.0)
MCH: 30.5 pg (ref 26.0–34.0)
MCHC: 35.3 g/dL (ref 30.0–36.0)
Monocytes Relative: 9 % (ref 3–12)
Neutrophils Relative %: 47 % (ref 43–77)
Platelets: 195 10*3/uL (ref 150–400)

## 2013-04-19 LAB — POCT I-STAT, CHEM 8
Calcium, Ion: 1.13 mmol/L (ref 1.12–1.23)
Glucose, Bld: 329 mg/dL — ABNORMAL HIGH (ref 70–99)
HCT: 44 % (ref 36.0–46.0)
Hemoglobin: 15 g/dL (ref 12.0–15.0)
Potassium: 3.3 mEq/L — ABNORMAL LOW (ref 3.5–5.1)
TCO2: 28 mmol/L (ref 0–100)

## 2013-04-19 MED ORDER — HYDROMORPHONE HCL PF 1 MG/ML IJ SOLN
1.0000 mg | Freq: Once | INTRAMUSCULAR | Status: AC
  Administered 2013-04-19: 1 mg via INTRAVENOUS
  Filled 2013-04-19: qty 1

## 2013-04-19 MED ORDER — PROMETHAZINE HCL 25 MG/ML IJ SOLN
25.0000 mg | Freq: Once | INTRAMUSCULAR | Status: AC
  Administered 2013-04-19: 25 mg via INTRAVENOUS
  Filled 2013-04-19: qty 1

## 2013-04-19 MED ORDER — LORAZEPAM 2 MG/ML IJ SOLN
1.0000 mg | Freq: Once | INTRAMUSCULAR | Status: AC
  Administered 2013-04-19: 1 mg via INTRAVENOUS
  Filled 2013-04-19 (×2): qty 1

## 2013-04-19 MED ORDER — LISINOPRIL 20 MG PO TABS
40.0000 mg | ORAL_TABLET | Freq: Once | ORAL | Status: AC
  Administered 2013-04-19: 40 mg via ORAL
  Filled 2013-04-19: qty 2

## 2013-04-19 MED ORDER — CARVEDILOL 25 MG PO TABS
25.0000 mg | ORAL_TABLET | Freq: Once | ORAL | Status: AC
  Administered 2013-04-19: 25 mg via ORAL
  Filled 2013-04-19: qty 1

## 2013-04-19 MED ORDER — METOCLOPRAMIDE HCL 10 MG PO TABS: 10.0000 mg | ORAL_TABLET | Freq: Four times a day (QID) | ORAL | Status: DC | PRN

## 2013-04-19 MED ORDER — HYDROMORPHONE HCL PF 1 MG/ML IJ SOLN
1.0000 mg | Freq: Once | INTRAMUSCULAR | Status: AC
  Administered 2013-04-19: 1 mg via INTRAVENOUS
  Filled 2013-04-19 (×2): qty 1

## 2013-04-19 MED ORDER — SODIUM CHLORIDE 0.9 % IV BOLUS (SEPSIS)
1000.0000 mL | Freq: Once | INTRAVENOUS | Status: AC
  Administered 2013-04-19: 1000 mL via INTRAVENOUS

## 2013-04-19 MED ORDER — LORAZEPAM 2 MG/ML IJ SOLN
1.0000 mg | Freq: Once | INTRAMUSCULAR | Status: AC
  Administered 2013-04-19: 1 mg via INTRAVENOUS
  Filled 2013-04-19: qty 1

## 2013-04-19 NOTE — Assessment & Plan Note (Signed)
   HTN: Elevated,possibly due to pain   Pain control might improve BP.     BP med adjustment at the ER as needed.     F/U after hospital discharge with PMD for BP management.

## 2013-04-19 NOTE — ED Notes (Signed)
Advised the patient that multiple staff member have advised that the patient is not being mindful about keeping her arm straight so she can receive her infusion and so that we can circulate amongst our other patients.

## 2013-04-19 NOTE — Assessment & Plan Note (Signed)
   DM : Off med, FSBG today is 317      To restart insulin slidding scale at the ER per FS level.

## 2013-04-19 NOTE — Assessment & Plan Note (Signed)
Abdominal pain: Currently symptomatic,prior hx of similar symptom,need assessment for pancreatitis since she has had it in the past.    Toradol offered,patient declined,she is allergic to oxycodone,Morphine not given.     She will benefit from ER evaluation and monitoring. Patient agreed to go to the ER.     I called and discussed patient with triage nurse: JESSICA at the ER.     F/U after ER discharge.

## 2013-04-19 NOTE — ED Provider Notes (Signed)
History     CSN: [id number]  Arrival date & time 04/19/13  1538   First MD Initiated Contact with Patient 04/19/13 1538      Chief Complaint  Patient presents with  . Abdominal Pain  . Nausea    (Consider location/radiation/quality/duration/timing/severity/associated sxs/prior treatment) Patient is a 47 y.o. female presenting with abdominal pain. The history is provided by the patient.  Abdominal Pain Pain location:  Epigastric Pain quality: aching and bloating   Pain radiates to:  Does not radiate Pain severity:  Moderate Onset quality:  Gradual Duration:  5 days Timing:  Intermittent Progression:  Waxing and waning Chronicity:  Recurrent Context comment:  Poorly-controlled IDDM, cyclic vomiting syndrome Relieved by:  Nothing Worsened by:  Movement Associated symptoms: diarrhea, nausea and vomiting   Associated symptoms: no chest pain, no chills, no constipation, no cough, no dysuria, no fever and no shortness of breath     Past Medical History  Diagnosis Date  . PANCREATITIS 05/09/2008  . Cyclic vomiting syndrome   . Hypertension   . Complication of anesthesia     "problems waking up"  . Heart murmur   . Myocardial infarction 07/2007    "they say I've had a silent one; I don't know"  . Pneumonia   . Anemia   . Blood transfusion   . GERD (gastroesophageal reflux disease)   . Anxiety   . Depression   . Smoker   . Marijuana smoker, continuous   . NEUROPATHY, DIABETIC 02/23/2007  . Type II diabetes mellitus   . Diabetic gastroparesis     /e-chart    Past Surgical History  Procedure Laterality Date  . Port-a-cath placement  ~ 2008    right chest; "poor access; frequent hospitalizations"  . Laparotomy and lysis of adhesions    . Abdominal hysterectomy  02/2002  . Cholecystectomy  1980's  . Cardiac catheterization    . Dilation and evacuation  X3  . Left hand surgery    . Cataract extraction w/ intraocular lens  implant, bilateral    . Colonoscopy    .  Upper gi endoscopy      Family History  Problem Relation Age of Onset  . Diabetes type II Mother   . Diabetes Mother   . Hypertension Mother   . Diabetes type II Sister   . Diabetes Sister   . Hypertension Sister   . Hypertension Brother     History  Substance Use Topics  . Smoking status: Current Some Day Smoker -- 0.50 packs/day for 22 years    Types: Cigarettes    Last Attempt to Quit: 02/22/2012  . Smokeless tobacco: Never Used  . Alcohol Use: No    OB History   Grav Para Term Preterm Abortions TAB SAB Ect Mult Living                  Review of Systems  Constitutional: Positive for activity change and appetite change. Negative for fever and chills.  Respiratory: Negative for cough, chest tightness, shortness of breath and wheezing.   Cardiovascular: Negative for chest pain and palpitations.  Gastrointestinal: Positive for nausea, vomiting, abdominal pain and diarrhea. Negative for constipation and blood in stool.  Genitourinary: Negative for dysuria and decreased urine volume.  Skin: Negative for rash and wound.  Neurological: Negative for syncope, facial asymmetry and light-headedness.  Psychiatric/Behavioral: Negative for confusion and agitation.  All other systems reviewed and are negative.    Allergies  Erythromycin; Acetaminophen; and Oxycodone-acetaminophen  Home  Medications   Current Outpatient Rx  Name  Route  Sig  Dispense  Refill  . aspirin EC 81 MG tablet   Oral   Take 81 mg by mouth daily.         . diclofenac sodium (VOLTAREN) 1 % GEL   Topical   Apply 2 g topically 4 (four) times daily as needed (pain).         . carvedilol (COREG) 25 MG tablet   Oral   Take 1 tablet (25 mg total) by mouth 2 (two) times daily with a meal.   60 tablet   11   . EPINEPHrine (EPIPEN) 0.3 mg/0.3 mL DEVI   Intramuscular   Inject 0.3 mg into the muscle once as needed (for allegic reactions).         . insulin glargine (LANTUS) 100 UNIT/ML injection    Subcutaneous   Inject 20 Units into the skin at bedtime.         . lidocaine (LIDODERM) 5 %   Transdermal   Place 1 patch onto the skin daily. Remove & Discard patch within 12 hours or as directed by MD         . lisinopril (PRINIVIL,ZESTRIL) 40 MG tablet   Oral   Take 1 tablet (40 mg total) by mouth daily.   30 tablet   11   . LORazepam (ATIVAN) 1 MG tablet   Oral   Take 1 tablet (1 mg total) by mouth 2 (two) times daily as needed (cyclical vomiting).   30 tablet   0   . omeprazole (PRILOSEC) 40 MG capsule   Oral   Take 40 mg by mouth daily.         . promethazine (PHENERGAN) 25 MG tablet   Oral   Take 1 tablet (25 mg total) by mouth every 6 (six) hours as needed for nausea.   30 tablet   1   . promethazine (PHENERGAN) 6.25 MG/5ML syrup   Oral   Take 20 mLs (25 mg total) by mouth 4 (four) times daily as needed for nausea.   120 mL   0   . sucralfate (CARAFATE) 1 G tablet   Oral   Take 1 g by mouth 4 (four) times daily as needed (stomach upset).           BP 236/118  Pulse 126  Temp(Src) 98 F (36.7 C) (Axillary)  Resp 18  SpO2 100%  Physical Exam  Nursing note and vitals reviewed. Constitutional: She is oriented to person, place, and time. She appears well-developed and well-nourished.  HENT:  Head: Normocephalic and atraumatic.  Right Ear: External ear normal.  Left Ear: External ear normal.  Nose: Nose normal.  Mouth/Throat: Oropharynx is clear and moist. No oropharyngeal exudate.  Eyes: Conjunctivae are normal. Pupils are equal, round, and reactive to light.  Neck: Normal range of motion. Neck supple.  Cardiovascular: Normal rate, regular rhythm, normal heart sounds and intact distal pulses.  Exam reveals no gallop and no friction rub.   No murmur heard. Pulmonary/Chest: Effort normal and breath sounds normal. No respiratory distress. She has no wheezes. She has no rales. She exhibits no tenderness.  Abdominal: Soft. Bowel sounds are normal.  She exhibits no distension and no mass. There is tenderness (moderate over epigastrum). There is no rebound and no guarding.  Musculoskeletal: Normal range of motion. She exhibits no edema and no tenderness.  Neurological: She is alert and oriented to person, place, and time.  Skin: Skin is warm and dry.  Psychiatric: She has a normal mood and affect. Her behavior is normal. Judgment and thought content normal.    ED Course  Procedures (including critical care time)  Labs Reviewed  POCT I-STAT, CHEM 8 - Abnormal; Notable for the following:    Potassium 3.3 (*)    BUN 24 (*)    Creatinine, Ser 2.00 (*)    Glucose, Bld 329 (*)    All other components within normal limits  CBC WITH DIFFERENTIAL  CG4 I-STAT (LACTIC ACID)   No results found.   1. Cyclic vomiting syndrome   2. Epigastric abdominal pain       MDM  47 yo F w/hx of cyclic vomiting syndrome and IDDM presents for exacerbation of vomiting. Seen 2 days ago for same symptoms. BP elevated as she has not taken her anti-hypertensives (Coreg, Lisinopril). Abdomen soft and pain/tenderness localized to epigastrum, site of previous episodes of pain. Not clinically dehydrated. Clinical picture not concerning for acute cholecystitis, AAA, or obstruction. Treating pain/nausea symptomatically in ED. Will provide home anti-hypertensives.   Pt tolerating PO intake in ED and BP decreased (SBP 160's). Will discharge home with anti-emetics and instructions to f/u with PCP as scheduled next week. Patient given return precautions, including worsening of signs or symptoms.        Marco Collie, MD 04/20/13 0040

## 2013-04-19 NOTE — ED Notes (Signed)
The patient came into another patient's room while I was administering care to complain that she had waited too long to be discharged.  I advised her that she needed to leave the room for privacy purposes and that I would be with her shortly.

## 2013-04-19 NOTE — ED Provider Notes (Signed)
47 year old female history of cyclic vomiting syndromecomes in with ongoing problems with crampy abdominal pain, nausea, vomiting. She is passing flatus. She been in the ED 2 days ago and felt better for brief period but got worse today. Pain is severe and she rates it at 9/10. She denies fever, chills, sweats. On exam, lungs are clear heart has regular rate and rhythm. Abdomen is slightly distended, soft, with mild tenderness diffusely. Bowel sounds are present but diminished. She'll be treated with IV fluids and antiemetics and reassessed.  I saw and evaluated the patient, reviewed the resident's note and I agree with the findings and plan.   Delora Fuel, MD [id number]

## 2013-04-19 NOTE — Progress Notes (Signed)
Subjective:     Patient ID: [name] [name], female   DOB: Nov 20, 1966, 47 y.o.   MRN: [id number]  HPI Abdominal pain: Patient c/o epigastric pain for the past few days gradually worsening,she was seen by her PMD yesterday for similar presentation and sent home on meds which had not help relieve her symptom. There is associated N/V,she can not keep her food down,hence she stopped using her insulin 3 days ago for the fear of hypoglycemia.She denies change in BM,her pain now is about 10/10 in severity. HTN: Compliant with med,she stated BP always go up with her GI symptoms,she denies neurologic symptoms. DM: off insulin for 3 days due to N/V  Past Medical History  Diagnosis Date  . PANCREATITIS 05/09/2008  . Cyclic vomiting syndrome   . Hypertension   . Complication of anesthesia     "problems waking up"  . Heart murmur   . Myocardial infarction 07/2007    "they say I've had a silent one; I don't know"  . Pneumonia   . Anemia   . Blood transfusion   . GERD (gastroesophageal reflux disease)   . Anxiety   . Depression   . Smoker   . Marijuana smoker, continuous   . NEUROPATHY, DIABETIC 02/23/2007  . Type II diabetes mellitus   . Diabetic gastroparesis     /e-chart     Review of Systems  Constitutional: Negative for fever.  Respiratory: Negative.   Cardiovascular: Negative.   Gastrointestinal: Positive for nausea, vomiting and abdominal pain. Negative for diarrhea, constipation, blood in stool and abdominal distention.  Genitourinary: Negative.   All other systems reviewed and are negative.   Filed Vitals:   04/19/13 1420  BP: 182/110  Pulse: 104  Temp: 99.1 F (37.3 C)  TempSrc: Oral  Height: 5\' 1"  (1.549 m)  Weight: 154 lb (69.854 kg)        Objective:   Physical Exam  Nursing note and vitals reviewed. Constitutional: She is oriented to person, place, and time. She appears well-developed. She appears distressed.  Patient crying in pain  Neck: Neck supple.   Cardiovascular: Normal rate, regular rhythm, normal heart sounds and intact distal pulses.   No murmur heard. Pulmonary/Chest: Effort normal and breath sounds normal. No respiratory distress. She has no wheezes. She exhibits no tenderness.  Abdominal: Soft. Bowel sounds are normal. She exhibits no distension and no mass. There is tenderness. There is no rebound and no guarding.  Neurological: She is alert and oriented to person, place, and time.  Skin: Skin is dry.       Assessment:     Abdominal pain: Currently symptomatic,prior hx of similar symptom,need assessment for pancreatitis since she has had it in the past.   HTN: Elevated,possibly due to pain  DM : Off med, FSBG today is 317     Plan:     1. Toradol offered,patient declined,she is allergic to oxycodone,Morphine not given.     She will benefit from ER evaluation and monitoring. Patient agreed to go to the ER.     I called and discussed patient with triage nurse: JESSICA at the ER.     F/U after ER discharge.  2. Pain control might improve BP.     BP med adjustment at the ER as needed.     F/U after hospital discharge with PMD for BP management.     3. To restart insulin slidding scale at the ER per FS level.

## 2013-04-19 NOTE — ED Notes (Addendum)
[facility] brings pt from [facility]. Pt c/o abd pain/n/v since Sunday. CBG 318. Pt c/o of upper middle quadrant abd pain. Pt reports vomiting since this morning. Hx of pancreatitis. Pt diaphoretic.

## 2013-04-20 MED ORDER — SODIUM CHLORIDE 0.9 % IV SOLN
INTRAVENOUS | Status: DC
  Administered 2013-04-19: 500 mL via INTRAVENOUS

## 2013-04-20 NOTE — Progress Notes (Signed)
Attempted 24 G left hand.  No good blood return.  2nd attempt--22 G IV placed in left AC.  Site prepped with CHG.  Good blood return.  Clear dressing applied.  Patient tolerated well and site unremarkable.  Nolene Ebbs, RN

## 2013-04-20 NOTE — Addendum Note (Signed)
Addended by: Burna Forts A on: 04/20/2013 09:11 AM   Modules accepted: Orders

## 2013-04-24 ENCOUNTER — Encounter: Payer: Self-pay | Admitting: Family Medicine

## 2013-04-24 ENCOUNTER — Ambulatory Visit (INDEPENDENT_AMBULATORY_CARE_PROVIDER_SITE_OTHER): Payer: Medicare Other | Admitting: Family Medicine

## 2013-04-24 VITALS — BP 140/82 | Ht 61.5 in | Wt 166.0 lb

## 2013-04-24 DIAGNOSIS — E1329 Other specified diabetes mellitus with other diabetic kidney complication: Secondary | ICD-10-CM

## 2013-04-24 DIAGNOSIS — E1322 Other specified diabetes mellitus with diabetic chronic kidney disease: Secondary | ICD-10-CM

## 2013-04-24 DIAGNOSIS — N183 Chronic kidney disease, stage 3 unspecified: Secondary | ICD-10-CM | POA: Diagnosis not present

## 2013-04-24 DIAGNOSIS — R1115 Cyclical vomiting syndrome unrelated to migraine: Secondary | ICD-10-CM | POA: Diagnosis not present

## 2013-04-24 DIAGNOSIS — E1365 Other specified diabetes mellitus with hyperglycemia: Secondary | ICD-10-CM

## 2013-04-24 NOTE — Assessment & Plan Note (Addendum)
Stable at this time. Continue current regimen. She did go to the ED a week ago. Stressors seem to set off symptoms but she is not amenable to psychiatric medications at this time. Follow-up in 2 weeks. Continue to avoid narcotics. May take Ativan, anti-emetics prn.

## 2013-04-24 NOTE — Patient Instructions (Addendum)
Continue your insulin in the morning with your first meal.  Increase to 25 units.   If you check your sugars at bedtime or the next morning and it is < 80 and you feel bad, eat a snack, and then decrease your insulin back to 20 units but call and let me know if this happens.   If you have not been able to eat for 12 hours, then cut back insulin to 15 units.   Call in 1 week and let me know where you sugars are. I will call you back.  Follow-up in 2 weeks.

## 2013-04-24 NOTE — Progress Notes (Signed)
  Subjective:    Patient ID: [name] [name], female    DOB: 1966/03/31, 47 y.o.   MRN: [id number]  HPI # Cyclical vomiting syndrome, diabetes She and her daughter had a fall out recently and her mom's anniversary occurred (when she passed away) and this may have contributed. She usually goes through something during this time.  She went to the ED 04/24.   Diabetes.  She has been checking twice a day.  In the morning. She is "most high" in the morning. 204, 210. Lowest 86; she did not feel nauseated, lightheaded at this time.  At bedtime. 190, 186. Never under 180.   Insulin in the morning.   Meals: times vary. She does not have a scheduled. She usually eats her first meal around 12.   Review of Systems Denies nausea, constipation at this time  Allergies, medication, past medical history reviewed.  Smoking status noted.     Objective:   Physical Exam GEN: NAD PSYCH: pleasant ABD: soft, NT, ND EXT: edema     Assessment & Plan:

## 2013-04-24 NOTE — Assessment & Plan Note (Signed)
Increase Lantus AM from 20 to 25 units. Call in 1 week to let me know where bid sugars are. Follow-up in 2 weeks.

## 2013-05-10 ENCOUNTER — Other Ambulatory Visit: Payer: Self-pay | Admitting: Family Medicine

## 2013-05-10 NOTE — Telephone Encounter (Signed)
Requesting refill on Ativan - please advise Tildon Husky, RN-BSN

## 2013-05-10 NOTE — Telephone Encounter (Signed)
OK to refill. Will you please call in? Already documented.

## 2013-05-11 ENCOUNTER — Emergency Department (HOSPITAL_COMMUNITY)
Admission: EM | Admit: 2013-05-11 | Discharge: 2013-05-11 | Disposition: A | Discharge status: Home / Self Care | Payer: Medicare Other | Attending: Emergency Medicine | Admitting: Emergency Medicine

## 2013-05-11 ENCOUNTER — Encounter (HOSPITAL_COMMUNITY): Payer: Self-pay | Admitting: Cardiology

## 2013-05-11 DIAGNOSIS — F329 Major depressive disorder, single episode, unspecified: Secondary | ICD-10-CM | POA: Insufficient documentation

## 2013-05-11 DIAGNOSIS — Z90710 Acquired absence of both cervix and uterus: Secondary | ICD-10-CM | POA: Insufficient documentation

## 2013-05-11 DIAGNOSIS — K859 Acute pancreatitis without necrosis or infection, unspecified: Secondary | ICD-10-CM | POA: Insufficient documentation

## 2013-05-11 DIAGNOSIS — Z8701 Personal history of pneumonia (recurrent): Secondary | ICD-10-CM | POA: Insufficient documentation

## 2013-05-11 DIAGNOSIS — F411 Generalized anxiety disorder: Secondary | ICD-10-CM | POA: Insufficient documentation

## 2013-05-11 DIAGNOSIS — R1084 Generalized abdominal pain: Secondary | ICD-10-CM | POA: Insufficient documentation

## 2013-05-11 DIAGNOSIS — R109 Unspecified abdominal pain: Secondary | ICD-10-CM

## 2013-05-11 DIAGNOSIS — Z8719 Personal history of other diseases of the digestive system: Secondary | ICD-10-CM | POA: Insufficient documentation

## 2013-05-11 DIAGNOSIS — F17200 Nicotine dependence, unspecified, uncomplicated: Secondary | ICD-10-CM | POA: Insufficient documentation

## 2013-05-11 DIAGNOSIS — Z9089 Acquired absence of other organs: Secondary | ICD-10-CM | POA: Insufficient documentation

## 2013-05-11 DIAGNOSIS — R112 Nausea with vomiting, unspecified: Secondary | ICD-10-CM | POA: Diagnosis not present

## 2013-05-11 DIAGNOSIS — E1149 Type 2 diabetes mellitus with other diabetic neurological complication: Secondary | ICD-10-CM | POA: Diagnosis not present

## 2013-05-11 DIAGNOSIS — E1142 Type 2 diabetes mellitus with diabetic polyneuropathy: Secondary | ICD-10-CM | POA: Insufficient documentation

## 2013-05-11 DIAGNOSIS — I252 Old myocardial infarction: Secondary | ICD-10-CM | POA: Diagnosis not present

## 2013-05-11 DIAGNOSIS — Z862 Personal history of diseases of the blood and blood-forming organs and certain disorders involving the immune mechanism: Secondary | ICD-10-CM | POA: Diagnosis not present

## 2013-05-11 DIAGNOSIS — Z79899 Other long term (current) drug therapy: Secondary | ICD-10-CM | POA: Diagnosis not present

## 2013-05-11 DIAGNOSIS — I10 Essential (primary) hypertension: Secondary | ICD-10-CM | POA: Diagnosis not present

## 2013-05-11 DIAGNOSIS — K219 Gastro-esophageal reflux disease without esophagitis: Secondary | ICD-10-CM | POA: Diagnosis not present

## 2013-05-11 DIAGNOSIS — Z794 Long term (current) use of insulin: Secondary | ICD-10-CM | POA: Insufficient documentation

## 2013-05-11 DIAGNOSIS — R011 Cardiac murmur, unspecified: Secondary | ICD-10-CM | POA: Diagnosis not present

## 2013-05-11 DIAGNOSIS — Z7982 Long term (current) use of aspirin: Secondary | ICD-10-CM | POA: Insufficient documentation

## 2013-05-11 DIAGNOSIS — F3289 Other specified depressive episodes: Secondary | ICD-10-CM | POA: Insufficient documentation

## 2013-05-11 DIAGNOSIS — Z9889 Other specified postprocedural states: Secondary | ICD-10-CM | POA: Diagnosis not present

## 2013-05-11 LAB — URINALYSIS, ROUTINE W REFLEX MICROSCOPIC
Bilirubin Urine: NEGATIVE
Nitrite: NEGATIVE
Specific Gravity, Urine: 1.012 (ref 1.005–1.030)
Urobilinogen, UA: 0.2 mg/dL (ref 0.0–1.0)
pH: 6 (ref 5.0–8.0)

## 2013-05-11 LAB — CBC WITH DIFFERENTIAL/PLATELET
Basophils Absolute: 0 10*3/uL (ref 0.0–0.1)
Basophils Relative: 0 % (ref 0–1)
Eosinophils Absolute: 0.1 10*3/uL (ref 0.0–0.7)
Eosinophils Relative: 2 % (ref 0–5)
MCH: 32 pg (ref 26.0–34.0)
MCHC: 35.3 g/dL (ref 30.0–36.0)
Neutrophils Relative %: 42 % — ABNORMAL LOW (ref 43–77)
Platelets: 229 10*3/uL (ref 150–400)
RBC: 3.66 MIL/uL — ABNORMAL LOW (ref 3.87–5.11)
RDW: 13.2 % (ref 11.5–15.5)

## 2013-05-11 LAB — COMPREHENSIVE METABOLIC PANEL
ALT: 9 U/L (ref 0–35)
Albumin: 3.9 g/dL (ref 3.5–5.2)
Alkaline Phosphatase: 85 U/L (ref 39–117)
Calcium: 9.9 mg/dL (ref 8.4–10.5)
Potassium: 4.1 mEq/L (ref 3.5–5.1)
Sodium: 142 mEq/L (ref 135–145)
Total Protein: 7.3 g/dL (ref 6.0–8.3)

## 2013-05-11 LAB — URINE MICROSCOPIC-ADD ON

## 2013-05-11 MED ORDER — SODIUM CHLORIDE 0.9 % IJ SOLN
10.0000 mL | INTRAMUSCULAR | Status: DC | PRN
  Administered 2013-05-11: 10 mL

## 2013-05-11 MED ORDER — LORAZEPAM 1 MG PO TABS: 1.0000 mg | ORAL_TABLET | Freq: Three times a day (TID) | ORAL | Status: DC | PRN

## 2013-05-11 MED ORDER — METOCLOPRAMIDE HCL 5 MG/ML IJ SOLN
10.0000 mg | Freq: Once | INTRAMUSCULAR | Status: AC
  Administered 2013-05-11: 10 mg via INTRAVENOUS
  Filled 2013-05-11: qty 2

## 2013-05-11 MED ORDER — HEPARIN SOD (PORK) LOCK FLUSH 100 UNIT/ML IV SOLN
500.0000 [IU] | INTRAVENOUS | Status: AC | PRN
  Administered 2013-05-11: 500 [IU]
  Filled 2013-05-11: qty 5

## 2013-05-11 MED ORDER — SODIUM CHLORIDE 0.9 % IV SOLN: Freq: Once | INTRAVENOUS | Status: DC

## 2013-05-11 MED ORDER — PROMETHAZINE HCL 25 MG PO TABS: 25.0000 mg | ORAL_TABLET | Freq: Three times a day (TID) | ORAL | Status: DC | PRN

## 2013-05-11 MED ORDER — HYDROMORPHONE HCL PF 1 MG/ML IJ SOLN
1.0000 mg | Freq: Once | INTRAMUSCULAR | Status: AC
  Administered 2013-05-11: 1 mg via INTRAVENOUS
  Filled 2013-05-11: qty 1

## 2013-05-11 MED ORDER — SODIUM CHLORIDE 0.9 % IV BOLUS (SEPSIS)
1000.0000 mL | Freq: Once | INTRAVENOUS | Status: AC
  Administered 2013-05-11: 1000 mL via INTRAVENOUS

## 2013-05-11 MED ORDER — HYDROMORPHONE HCL PF 2 MG/ML IJ SOLN
2.0000 mg | Freq: Once | INTRAMUSCULAR | Status: AC
  Administered 2013-05-11: 2 mg via INTRAVENOUS
  Filled 2013-05-11: qty 1

## 2013-05-11 MED ORDER — LORAZEPAM 2 MG/ML IJ SOLN
1.0000 mg | Freq: Once | INTRAMUSCULAR | Status: AC
  Administered 2013-05-11: 1 mg via INTRAVENOUS
  Filled 2013-05-11: qty 1

## 2013-05-11 MED ORDER — OXYCODONE HCL 5 MG PO TABS: 5.0000 mg | ORAL_TABLET | ORAL | Status: DC | PRN

## 2013-05-11 MED ORDER — SODIUM CHLORIDE 0.9 % IJ SOLN
10.0000 mL | Freq: Two times a day (BID) | INTRAMUSCULAR | Status: DC
  Administered 2013-05-11: 10 mL

## 2013-05-11 NOTE — ED Notes (Signed)
Waiting for pt's port to be de-accessed then she will be d/c.

## 2013-05-11 NOTE — ED Notes (Signed)
Pt c/o abd pain along with n/v. sts she has been getting this for 11 years. Pt sts she gets this episodes on and off & has to come to the ED to get in under control. Pt reports the only things that work is when she is given IV reglan or pherengan along with ativan and dilaudid. Pt reports she is under a lot of stress because her boyfriend's son just died. Pt is crying about situation and states she just can't take the pain anymore. Pt denies SI/HI. Pt in nad, skin warm and dry, resp e/u, ambulatory with no issues.

## 2013-05-11 NOTE — ED Notes (Signed)
Pt reports that she woke up this morning with n/v and abd pain. Reports that she has vomited once. Denies any urinary symptoms or flank pain. Reports loose stools yesterday.

## 2013-05-11 NOTE — ED Provider Notes (Signed)
History     CSN: [id number]  Arrival date & time 05/11/13  48   First MD Initiated Contact with Patient 05/11/13 1109      Chief Complaint  Patient presents with  . Abdominal Pain    (Consider location/radiation/quality/duration/timing/severity/associated sxs/prior treatment) HPI Comments: Patient with a PMH significant for pancreatitic, cyclic vomiting syndrome, HTN, anemia, depression, anxiety, DM2, diabetic gastroparesis, presents to the ED for abdominal pain, nausea and non-bloody vomiting since earlier this a.m. Patient has a history of cyclic vomiting syndrome and states the symptoms are consistent with her usual flares. Denies any intake of unusual or irritating foods.  Patient states cycle vomiting is due to complications during hysterectomy 11 years ago-small bowel was wrapped around her ovary causing and obstruction. There were several revision surgeries causing further small bowel obstructions, infections, and chronic complications. Patient has not seen a surgeon in several years. Has tried taking her home Phenergan and Ativan without relief of symptoms. Bowel movements have been normal, no diarrhea or hematochezia. No flank pain, dysuria, increased urinary frequency, or vaginal discharge. Denies any recent fever, sweats, chills.  No recent EtOH.  The history is provided by the patient.    Past Medical History  Diagnosis Date  . PANCREATITIS 05/09/2008  . Cyclic vomiting syndrome   . Hypertension   . Complication of anesthesia     "problems waking up"  . Heart murmur   . Myocardial infarction 07/2007    "they say I've had a silent one; I don't know"  . Pneumonia   . Anemia   . Blood transfusion   . GERD (gastroesophageal reflux disease)   . Anxiety   . Depression   . Smoker   . Marijuana smoker, continuous   . NEUROPATHY, DIABETIC 02/23/2007  . Type II diabetes mellitus   . Diabetic gastroparesis     /e-chart    Past Surgical History  Procedure Laterality Date   . Port-a-cath placement  ~ 2008    right chest; "poor access; frequent hospitalizations"  . Laparotomy and lysis of adhesions    . Abdominal hysterectomy  02/2002  . Cholecystectomy  1980's  . Cardiac catheterization    . Dilation and evacuation  X3  . Left hand surgery    . Cataract extraction w/ intraocular lens  implant, bilateral    . Colonoscopy    . Upper gi endoscopy      Family History  Problem Relation Age of Onset  . Diabetes type II Mother   . Diabetes Mother   . Hypertension Mother   . Diabetes type II Sister   . Diabetes Sister   . Hypertension Sister   . Hypertension Brother     History  Substance Use Topics  . Smoking status: Current Some Day Smoker -- 0.50 packs/day for 22 years    Types: Cigarettes    Last Attempt to Quit: 02/22/2012  . Smokeless tobacco: Never Used  . Alcohol Use: No    OB History   Grav Para Term Preterm Abortions TAB SAB Ect Mult Living                  Review of Systems  Gastrointestinal: Positive for nausea, vomiting and abdominal pain.  All other systems reviewed and are negative.    Allergies  Erythromycin; Acetaminophen; and Oxycodone-acetaminophen  Home Medications   Current Outpatient Rx  Name  Route  Sig  Dispense  Refill  . aspirin EC 81 MG tablet   Oral   Take  81 mg by mouth daily.         . carvedilol (COREG) 25 MG tablet   Oral   Take 1 tablet (25 mg total) by mouth 2 (two) times daily with a meal.   60 tablet   11   . diclofenac sodium (VOLTAREN) 1 % GEL   Topical   Apply 2 g topically 4 (four) times daily as needed (pain).         Marland Kitchen EPINEPHrine (EPIPEN) 0.3 mg/0.3 mL DEVI   Intramuscular   Inject 0.3 mg into the muscle once as needed (for allegic reactions).         . insulin glargine (LANTUS) 100 UNIT/ML injection   Subcutaneous   Inject 20 Units into the skin at bedtime.         . lidocaine (LIDODERM) 5 %   Transdermal   Place 1 patch onto the skin daily. Remove & Discard patch  within 12 hours or as directed by MD         . lisinopril (PRINIVIL,ZESTRIL) 40 MG tablet   Oral   Take 1 tablet (40 mg total) by mouth daily.   30 tablet   11   . LORazepam (ATIVAN) 1 MG tablet      TAKE 1 TABLET BY MOUTH  TWICE DAILY  AS NEEDED FOR VOMITING   60 tablet   0   . metoCLOPramide (REGLAN) 10 MG tablet   Oral   Take 1 tablet (10 mg total) by mouth every 6 (six) hours as needed (nausea/headache).   6 tablet   0   . omeprazole (PRILOSEC) 40 MG capsule   Oral   Take 40 mg by mouth daily.         . promethazine (PHENERGAN) 25 MG tablet   Oral   Take 1 tablet (25 mg total) by mouth every 6 (six) hours as needed for nausea.   30 tablet   1   . promethazine (PHENERGAN) 6.25 MG/5ML syrup   Oral   Take 20 mLs (25 mg total) by mouth 4 (four) times daily as needed for nausea.   120 mL   0   . sucralfate (CARAFATE) 1 G tablet               . ULTICARE INSULIN SYRINGE 31G X 5/16" 0.3 ML MISC                 BP 185/94  Pulse 71  Temp(Src) 97.6 F (36.4 C) (Oral)  Resp 16  SpO2 98%  Physical Exam  Nursing note and vitals reviewed. Constitutional: She is oriented to person, place, and time. She appears well-developed and well-nourished.  HENT:  Head: Normocephalic and atraumatic.  Mouth/Throat: Oropharynx is clear and moist.  Mildly dry mucus membranes  Eyes: Conjunctivae and EOM are normal. Pupils are equal, round, and reactive to light.  Neck: Normal range of motion.  Cardiovascular: Normal rate, regular rhythm and normal heart sounds.   Pulmonary/Chest: Effort normal and breath sounds normal.  Abdominal: Soft. Bowel sounds are normal. There is tenderness in the epigastric area. There is no CVA tenderness, no tenderness at McBurney's point and negative Murphy's sign.  Generalized abdominal discomfort, worse in the epigastric region, no flank pain  Musculoskeletal: Normal range of motion.  Neurological: She is alert and oriented to person,  place, and time.  Skin: Skin is warm and dry.  Psychiatric: Her mood appears anxious.  Anxious affect, tearful    ED Course  Procedures (  including critical care time)  Labs Reviewed  CBC WITH DIFFERENTIAL - Abnormal; Notable for the following:    RBC 3.66 (*)    Hemoglobin 11.7 (*)    HCT 33.1 (*)    Neutrophils Relative % 42 (*)    Lymphocytes Relative 48 (*)    All other components within normal limits  COMPREHENSIVE METABOLIC PANEL - Abnormal; Notable for the following:    Glucose, Bld 119 (*)    Creatinine, Ser 1.63 (*)    GFR calc non Af Amer 36 (*)    GFR calc Af Amer 42 (*)    All other components within normal limits  LIPASE, BLOOD - Abnormal; Notable for the following:    Lipase 122 (*)    All other components within normal limits  URINALYSIS, ROUTINE W REFLEX MICROSCOPIC - Abnormal; Notable for the following:    Glucose, UA 250 (*)    Hgb urine dipstick SMALL (*)    Protein, ur 100 (*)    All other components within normal limits  URINE MICROSCOPIC-ADD ON - Abnormal; Notable for the following:    Squamous Epithelial / LPF FEW (*)    All other components within normal limits   No results found.   1. Nausea and vomiting   2. Abdominal pain   3. Pancreatitis       MDM   47 year old female presenting to the ED for abdominal pain, nausea, and vomiting. Patient has a history of cyclic vomiting syndrome, reports these are her usual symptoms.  Labs as above, no leukocytosis. Lipase elevated at 122-patient has a history of pancreatitis.  Signs/sx not consistent with SBO, low suspicion for surgical abdomen.  Patient afebrile, nontoxic-appearing, in no acute distress, vital signs stable. Sx well controlled with IVF, reglan, dilaudid, and ativan.  Pt feeling much better and tolerating fluids PO prior to d/c.  Rx ativan, phenergan, and oxycodone.  FU with PCP if sx worsen.  Discussed plan with pt, she agreed.  Return precautions advised.      Larene Pickett,  PA-C 05/11/13 [phone]

## 2013-05-11 NOTE — ED Notes (Signed)
Pt has port-a-cath

## 2013-05-11 NOTE — ED Provider Notes (Signed)
Medical screening examination/treatment/procedure(s) were conducted as a shared visit with non-physician practitioner(s) and myself.  I personally evaluated the patient during the encounter.  Lipase to be elevated. No acute abdomen. Patient did not want to be admitted to the hospital  Nat Christen, MD 05/11/13 1958

## 2013-05-16 ENCOUNTER — Ambulatory Visit (INDEPENDENT_AMBULATORY_CARE_PROVIDER_SITE_OTHER): Payer: Medicare Other | Admitting: Family Medicine

## 2013-05-16 VITALS — BP 136/73 | HR 63 | Temp 98.4°F | Ht 61.5 in | Wt 169.0 lb

## 2013-05-16 DIAGNOSIS — E1322 Other specified diabetes mellitus with diabetic chronic kidney disease: Secondary | ICD-10-CM

## 2013-05-16 DIAGNOSIS — E1329 Other specified diabetes mellitus with other diabetic kidney complication: Secondary | ICD-10-CM

## 2013-05-16 DIAGNOSIS — R1115 Cyclical vomiting syndrome unrelated to migraine: Secondary | ICD-10-CM | POA: Diagnosis not present

## 2013-05-16 DIAGNOSIS — E1365 Other specified diabetes mellitus with hyperglycemia: Secondary | ICD-10-CM

## 2013-05-16 DIAGNOSIS — N183 Chronic kidney disease, stage 3 unspecified: Secondary | ICD-10-CM | POA: Diagnosis not present

## 2013-05-16 LAB — GLUCOSE, CAPILLARY: Glucose-Capillary: 35 mg/dL — CL (ref 70–99)

## 2013-05-16 MED ORDER — LORAZEPAM 1 MG PO TABS: 1.0000 mg | ORAL_TABLET | Freq: Four times a day (QID) | ORAL | Status: DC | PRN

## 2013-05-16 NOTE — Assessment & Plan Note (Signed)
HgbA1c improved. However she has frequent hypoglycemic episodes.  Notebook given to record log of sugars, meal times, insulin times and amount.  Follow-up next week.

## 2013-05-16 NOTE — Progress Notes (Signed)
  Subjective:    Patient ID: [name] [name], female    DOB: 12/28/1965, 47 y.o.   MRN: [id number]  HPI # Cyclical vomiting Seen in ED 05/16 for exacerbation Given Rx for oxycodone 5, 10 tablets  She is requesting pain medication  Last used marijuana 3 weeks ago  # DM BS in clinic 35 then 130s today after 30 g glucose and 2 crackers today  Home BS?   A few lows 30s and 40s since her last visit   Last night she ate "pretty late"    This morning it was 198, and she gave herself her regular Lantus. Then it went up to 288 (she had eaten coffee with cream and toast) and she gave herself 2 U of Novolog and it went down to 208. When she arrived here, she felt like she was hypoglycemic, prompting [place] to re-check.    Sugars are usually 177 after she eats something Insulin?    Lantus? 20 units in the AM    Novolog? Sliding scale. She usually does not take because it causes her feet to cramp.   Review of Systems Denies constipation, SI/HI  Allergies, medication, past medical history reviewed.  Smoking status noted.     Objective:   Physical Exam GEN: NAD; well-nourished, -appearing CV: RRR PULM: NI WOB ABD: soft, NT, MD PSYCH: tearful, appears anxious, depressed; appropriate to questions, normal though process and content    Assessment & Plan:

## 2013-05-16 NOTE — Patient Instructions (Addendum)
You may take Ativan and Phenergan as needed for cyclical vomiting flares  Please record your blood sugars and bring your log sugars   Follow-up on Tuesday

## 2013-05-16 NOTE — Progress Notes (Signed)
Checked pt glucose @ 11:09  Glucose = 35 mg/dL    Gave pt 30 gm glucola plus fixed her 2 peanut butter/ritz crackers; reported to MD Rechecked @ 11:22  Glucose = 47 mg/dL   Continued to watch pt without giving any additional glucose;  Reported to MD Rechecked @ 11:40  Glucose = 135 mg/dL,  Also ran A1c = 8.4%  Pt now seen by MD Unk Lightning, MLS (ASCP)cm

## 2013-05-16 NOTE — Assessment & Plan Note (Signed)
Exacerbated by stress probably.  She requested pain medication to help with exacerbations, but I do not want her to take medications regularly and she seems to take it every few days due to "exacerbations". We had a long discussion about this. I understand her wanting to treat symptoms at home and not go to ED/clinic every time, however, narcotics not appropriate treatment of cyclical vomiting exacerbations.  Advised she take Ativan/Phenergan prn. She had been out of Ativan prior to ED visit recently.  Also continue to try to aim for better glycemic control. See above.  She declines medications/treatment for depression/stressors at this time. She has significant stressors; 5 family members/close friends very sick or passed.

## 2013-05-23 ENCOUNTER — Ambulatory Visit: Payer: Medicare Other | Admitting: Family Medicine

## 2013-05-25 ENCOUNTER — Other Ambulatory Visit: Payer: Self-pay | Admitting: *Deleted

## 2013-05-25 MED ORDER — SUCRALFATE 1 G PO TABS: 1.0000 g | ORAL_TABLET | Freq: Four times a day (QID) | ORAL | Status: DC

## 2013-05-29 ENCOUNTER — Telehealth: Payer: Self-pay | Admitting: *Deleted

## 2013-05-29 MED ORDER — ASPIRIN EC 81 MG PO TBEC: 81.0000 mg | DELAYED_RELEASE_TABLET | Freq: Every day | ORAL | Status: DC

## 2013-05-29 NOTE — Telephone Encounter (Signed)
Refill on asa 81 mg requested. Tildon Husky, RN-BSN

## 2013-05-29 NOTE — Telephone Encounter (Signed)
Please notify Rx sent

## 2013-05-31 ENCOUNTER — Emergency Department (HOSPITAL_COMMUNITY)
Admission: EM | Admit: 2013-05-31 | Discharge: 2013-06-01 | Disposition: A | Discharge status: Home / Self Care | Payer: Medicare Other | Attending: Emergency Medicine | Admitting: Emergency Medicine

## 2013-05-31 ENCOUNTER — Encounter (HOSPITAL_COMMUNITY): Payer: Self-pay | Admitting: Family Medicine

## 2013-05-31 DIAGNOSIS — Z8701 Personal history of pneumonia (recurrent): Secondary | ICD-10-CM | POA: Insufficient documentation

## 2013-05-31 DIAGNOSIS — R1115 Cyclical vomiting syndrome unrelated to migraine: Secondary | ICD-10-CM | POA: Insufficient documentation

## 2013-05-31 DIAGNOSIS — Z8679 Personal history of other diseases of the circulatory system: Secondary | ICD-10-CM | POA: Insufficient documentation

## 2013-05-31 DIAGNOSIS — F1220 Cannabis dependence, uncomplicated: Secondary | ICD-10-CM | POA: Insufficient documentation

## 2013-05-31 DIAGNOSIS — Z7982 Long term (current) use of aspirin: Secondary | ICD-10-CM | POA: Insufficient documentation

## 2013-05-31 DIAGNOSIS — F17200 Nicotine dependence, unspecified, uncomplicated: Secondary | ICD-10-CM | POA: Insufficient documentation

## 2013-05-31 DIAGNOSIS — Z8719 Personal history of other diseases of the digestive system: Secondary | ICD-10-CM | POA: Insufficient documentation

## 2013-05-31 DIAGNOSIS — Z8659 Personal history of other mental and behavioral disorders: Secondary | ICD-10-CM | POA: Insufficient documentation

## 2013-05-31 DIAGNOSIS — R1013 Epigastric pain: Secondary | ICD-10-CM | POA: Diagnosis not present

## 2013-05-31 DIAGNOSIS — I252 Old myocardial infarction: Secondary | ICD-10-CM | POA: Insufficient documentation

## 2013-05-31 DIAGNOSIS — Z794 Long term (current) use of insulin: Secondary | ICD-10-CM | POA: Insufficient documentation

## 2013-05-31 DIAGNOSIS — R1110 Vomiting, unspecified: Secondary | ICD-10-CM

## 2013-05-31 DIAGNOSIS — I10 Essential (primary) hypertension: Secondary | ICD-10-CM | POA: Insufficient documentation

## 2013-05-31 DIAGNOSIS — R079 Chest pain, unspecified: Secondary | ICD-10-CM | POA: Insufficient documentation

## 2013-05-31 DIAGNOSIS — Z79899 Other long term (current) drug therapy: Secondary | ICD-10-CM | POA: Insufficient documentation

## 2013-05-31 DIAGNOSIS — Z862 Personal history of diseases of the blood and blood-forming organs and certain disorders involving the immune mechanism: Secondary | ICD-10-CM | POA: Insufficient documentation

## 2013-05-31 DIAGNOSIS — E1149 Type 2 diabetes mellitus with other diabetic neurological complication: Secondary | ICD-10-CM | POA: Insufficient documentation

## 2013-05-31 DIAGNOSIS — K219 Gastro-esophageal reflux disease without esophagitis: Secondary | ICD-10-CM | POA: Insufficient documentation

## 2013-05-31 LAB — CBC WITH DIFFERENTIAL/PLATELET
Basophils Absolute: 0 10*3/uL (ref 0.0–0.1)
Eosinophils Relative: 2 % (ref 0–5)
HCT: 33.7 % — ABNORMAL LOW (ref 36.0–46.0)
Lymphocytes Relative: 49 % — ABNORMAL HIGH (ref 12–46)
Lymphs Abs: 2.8 10*3/uL (ref 0.7–4.0)
MCV: 91.1 fL (ref 78.0–100.0)
Monocytes Absolute: 0.4 10*3/uL (ref 0.1–1.0)
Neutro Abs: 2.5 10*3/uL (ref 1.7–7.7)
RBC: 3.7 MIL/uL — ABNORMAL LOW (ref 3.87–5.11)
RDW: 13.5 % (ref 11.5–15.5)
WBC: 5.8 10*3/uL (ref 4.0–10.5)

## 2013-05-31 LAB — COMPREHENSIVE METABOLIC PANEL
ALT: 11 U/L (ref 0–35)
AST: 16 U/L (ref 0–37)
CO2: 24 mEq/L (ref 19–32)
Chloride: 106 mEq/L (ref 96–112)
Creatinine, Ser: 2 mg/dL — ABNORMAL HIGH (ref 0.50–1.10)
GFR calc Af Amer: 33 mL/min — ABNORMAL LOW (ref >90)
GFR calc non Af Amer: 28 mL/min — ABNORMAL LOW (ref >90)
Glucose, Bld: 199 mg/dL — ABNORMAL HIGH (ref 70–99)
Sodium: 141 mEq/L (ref 135–145)
Total Bilirubin: 0.3 mg/dL (ref 0.3–1.2)

## 2013-05-31 MED ORDER — SODIUM CHLORIDE 0.9 % IV BOLUS (SEPSIS)
2000.0000 mL | Freq: Once | INTRAVENOUS | Status: AC
  Administered 2013-05-31: 2000 mL via INTRAVENOUS

## 2013-05-31 MED ORDER — DIPHENHYDRAMINE HCL 50 MG/ML IJ SOLN
25.0000 mg | Freq: Once | INTRAMUSCULAR | Status: AC
  Administered 2013-05-31: 25 mg via INTRAVENOUS
  Filled 2013-05-31: qty 1

## 2013-05-31 MED ORDER — ONDANSETRON HCL 4 MG/2ML IJ SOLN
4.0000 mg | Freq: Once | INTRAMUSCULAR | Status: AC
  Administered 2013-05-31: 4 mg via INTRAVENOUS
  Filled 2013-05-31: qty 2

## 2013-05-31 MED ORDER — METOCLOPRAMIDE HCL 5 MG/ML IJ SOLN
10.0000 mg | Freq: Once | INTRAMUSCULAR | Status: AC
  Administered 2013-05-31: 10 mg via INTRAVENOUS
  Filled 2013-05-31: qty 2

## 2013-05-31 MED ORDER — HYDROMORPHONE HCL PF 1 MG/ML IJ SOLN
1.0000 mg | Freq: Once | INTRAMUSCULAR | Status: AC
  Administered 2013-05-31: 1 mg via INTRAVENOUS
  Filled 2013-05-31: qty 1

## 2013-05-31 NOTE — ED Notes (Signed)
Patient sleeping when RN entered room to administer medications. Pain assessed. Patient states that her abdominal pain is better but she still has a headache

## 2013-05-31 NOTE — ED Notes (Signed)
Patient presents with c/o generalized abdominal pain, headache, nausea and vomiting. Has hx pancreatitis, cyclic vomiting syndrome. States that her medications at home has not been able to control her symptoms. Denies SOB, dizziness, constipation or diarrhea. No black or bloody stools. LBM today.

## 2013-05-31 NOTE — ED Notes (Signed)
Per pt sts chest pain, abdominal pain, back pain and vomiting that started today. Denies SOB.

## 2013-05-31 NOTE — ED Notes (Signed)
Wait time advised

## 2013-05-31 NOTE — ED Provider Notes (Signed)
History     CSN: [id number]  Arrival date & time 05/31/13  53   First MD Initiated Contact with Patient 05/31/13 2119      Chief Complaint  Patient presents with  . Abdominal Pain  . Chest Pain  . Emesis    (Consider location/radiation/quality/duration/timing/severity/associated sxs/prior treatment) Patient is a 47 y.o. female presenting with abdominal pain and vomiting.  Abdominal Pain The current episode started today. The problem occurs constantly. The problem has been unchanged. Associated symptoms include abdominal pain, headaches, nausea, vomiting and weakness. Pertinent negatives include no chest pain, chills, congestion, coughing, fever, numbness, rash, sore throat or vertigo. The symptoms are aggravated by eating and drinking. She has tried nothing for the symptoms. The treatment provided mild relief.  Emesis Associated symptoms: abdominal pain and headaches   Associated symptoms: no chills, no diarrhea and no sore throat     Past Medical History  Diagnosis Date  . PANCREATITIS 05/09/2008  . Cyclic vomiting syndrome   . Hypertension   . Complication of anesthesia     "problems waking up"  . Heart murmur   . Myocardial infarction 07/2007    "they say I've had a silent one; I don't know"  . Pneumonia   . Anemia   . Blood transfusion   . GERD (gastroesophageal reflux disease)   . Anxiety   . Depression   . Smoker   . Marijuana smoker, continuous   . NEUROPATHY, DIABETIC 02/23/2007  . Type II diabetes mellitus   . Diabetic gastroparesis     /e-chart    Past Surgical History  Procedure Laterality Date  . Port-a-cath placement  ~ 2008    right chest; "poor access; frequent hospitalizations"  . Laparotomy and lysis of adhesions    . Abdominal hysterectomy  02/2002  . Cholecystectomy  1980's  . Cardiac catheterization    . Dilation and evacuation  X3  . Left hand surgery    . Cataract extraction w/ intraocular lens  implant, bilateral    . Colonoscopy    .  Upper gi endoscopy      Family History  Problem Relation Age of Onset  . Diabetes type II Mother   . Diabetes Mother   . Hypertension Mother   . Diabetes type II Sister   . Diabetes Sister   . Hypertension Sister   . Hypertension Brother     History  Substance Use Topics  . Smoking status: Current Some Day Smoker -- 0.50 packs/day for 22 years    Types: Cigarettes    Last Attempt to Quit: 02/22/2012  . Smokeless tobacco: Never Used  . Alcohol Use: No    OB History   Grav Para Term Preterm Abortions TAB SAB Ect Mult Living                  Review of Systems  Constitutional: Negative for fever and chills.  HENT: Negative for congestion, sore throat and rhinorrhea.   Eyes: Negative for photophobia and visual disturbance.  Respiratory: Negative for cough and shortness of breath.   Cardiovascular: Negative for chest pain and leg swelling.  Gastrointestinal: Positive for nausea, vomiting and abdominal pain. Negative for diarrhea and constipation.  Endocrine: Negative for polyphagia and polyuria.  Genitourinary: Negative for dysuria, flank pain, vaginal bleeding, vaginal discharge and enuresis.  Musculoskeletal: Negative for back pain and gait problem.  Skin: Negative for color change and rash.  Neurological: Positive for weakness and headaches. Negative for dizziness, vertigo, syncope, light-headedness and  numbness.  Hematological: Negative for adenopathy. Does not bruise/bleed easily.  All other systems reviewed and are negative.    Allergies  Bee venom; Erythromycin; Acetaminophen; and Novolog  Home Medications   Current Outpatient Rx  Name  Route  Sig  Dispense  Refill  . aspirin EC 81 MG tablet   Oral   Take 1 tablet (81 mg total) by mouth daily.   90 tablet   3   . carvedilol (COREG) 25 MG tablet   Oral   Take 25 mg by mouth 2 (two) times daily with a meal.         . diclofenac sodium (VOLTAREN) 1 % GEL   Topical   Apply 2 g topically 4 (four) times  daily as needed (pain).         Marland Kitchen EPINEPHrine (EPIPEN) 0.3 mg/0.3 mL DEVI   Intramuscular   Inject 0.3 mg into the muscle once as needed (for allegic reactions).         . insulin glargine (LANTUS) 100 UNIT/ML injection   Subcutaneous   Inject 20 Units into the skin every morning.          . lidocaine (LIDODERM) 5 %   Transdermal   Place 1 patch onto the skin daily as needed (for pain). Remove & Discard patch within 12 hours or as directed by MD         . lisinopril (PRINIVIL,ZESTRIL) 40 MG tablet   Oral   Take 1 tablet (40 mg total) by mouth daily.   30 tablet   11   . LORazepam (ATIVAN) 1 MG tablet   Oral   Take 1 mg by mouth every 6 (six) hours as needed (nausea).         . metoCLOPramide (REGLAN) 10 MG tablet   Oral   Take 1 tablet (10 mg total) by mouth every 6 (six) hours as needed (nausea/headache).   6 tablet   0   . omeprazole (PRILOSEC) 40 MG capsule   Oral   Take 40 mg by mouth daily.         . promethazine (PHENERGAN) 25 MG tablet   Oral   Take 1 tablet (25 mg total) by mouth every 8 (eight) hours as needed for nausea.   12 tablet   0   . sucralfate (CARAFATE) 1 G tablet   Oral   Take 1 g by mouth 4 (four) times daily as needed (nausea/indigestion).         Marland Kitchen ULTICARE INSULIN SYRINGE 31G X 5/16" 0.3 ML MISC                 BP 190/93  Pulse 88  Temp(Src) 98.1 F (36.7 C)  Resp 14  SpO2 100%  Physical Exam  Vitals reviewed. Constitutional: She is oriented to person, place, and time. She appears well-developed and well-nourished.  HENT:  Head: Normocephalic and atraumatic.  Right Ear: External ear normal.  Left Ear: External ear normal.  Eyes: Conjunctivae and EOM are normal. Pupils are equal, round, and reactive to light.  Neck: Normal range of motion. Neck supple.  Cardiovascular: Normal rate, regular rhythm, normal heart sounds and intact distal pulses.   Pulmonary/Chest: Effort normal and breath sounds normal.   Abdominal: Soft. Bowel sounds are normal. There is tenderness in the epigastric area.  Musculoskeletal: Normal range of motion.  Neurological: She is alert and oriented to person, place, and time.  Skin: Skin is warm and dry.    ED  Course  Procedures (including critical care time)  Labs Reviewed  CBC WITH DIFFERENTIAL - Abnormal; Notable for the following:    RBC 3.70 (*)    Hemoglobin 11.8 (*)    HCT 33.7 (*)    Lymphocytes Relative 49 (*)    All other components within normal limits  COMPREHENSIVE METABOLIC PANEL - Abnormal; Notable for the following:    Glucose, Bld 199 (*)    BUN 33 (*)    Creatinine, Ser 2.00 (*)    GFR calc non Af Amer 28 (*)    GFR calc Af Amer 33 (*)    All other components within normal limits  LIPASE, BLOOD  POCT I-STAT TROPONIN I   No results found.   1. Abdominal pain, epigastric   2. Cyclic vomiting syndrome   3. Vomiting       Date: 05/31/2013  Rate: 91  Rhythm: normal sinus rhythm  QRS Axis: normal  Intervals: normal  ST/T Wave abnormalities: nonspecific ST changes  Conduction Disutrbances:none  Narrative Interpretation:   Old EKG Reviewed: unchanged   MDM  47 y.o. female  with pertinent PMH of cyclic vomiting syndrome, pancreatitis presents with abd pain similar to previous episodes of cyclic vomiting syndrome.  Pt well known to department, has been weaned off narcotics at home, states that her home antiemetics did not help with symptoms which began 2 hours prior to visit, however have been intermittent all day.  Pt has had vomiting, unusual from baseline, however denies diarrhea, fever, dysuria, or other symptoms.  Physical exam as above with primarily epigastric abd tenderness, no tachycardia, however pt with dry mucous membranes.No indication for upreg, pt is post hysterectomy.   Pt given zofran, dilaudid, 2L NS bolus, and reglan, symptomatically improved and taking PO. Labs within baseline for patient.  Feel her stable to dc home  with pcp fu.  Doubt appendicitis, mesenteric ischemia, or other emergent pathology given nature of symptoms and physical exam.  We have discussed return precautions and she voices understanding and agrees to followup with her PCP.    Labs and imaging as above reviewed by myself and attending,Dr. Christy Gentles, with whom case was discussed.   1. Abdominal pain, epigastric   2. Cyclic vomiting syndrome   3. Vomiting             Rexene Agent, MD 05/31/13 2359

## 2013-06-01 NOTE — ED Notes (Signed)
Discharge instructions and information reviewed and given to patient. Patient verbalizes understanding and reports that she has an appt with her PCP on Wednesday. Pt left department with all personal belongings.

## 2013-06-01 NOTE — ED Notes (Signed)
Patient has hx HTN. Reports that she has not taken her medications today.

## 2013-06-03 NOTE — ED Provider Notes (Signed)
I have personally seen and examined the patient.  I have discussed the plan of care with the resident.  I have reviewed the documentation on PMH/FH/Soc. History.  I have reviewed the documentation of the resident and agree.  I have reviewed and agree with the ECG interpretation(s) documented by the resident.  Abdomen soft without focal tenderness on my evaluation Patient improved in the ED Stable for d/c home   Sharyon Cable, MD 06/03/13 [phone]

## 2013-06-06 ENCOUNTER — Ambulatory Visit (INDEPENDENT_AMBULATORY_CARE_PROVIDER_SITE_OTHER): Payer: Medicare Other | Admitting: Family Medicine

## 2013-06-06 ENCOUNTER — Emergency Department (HOSPITAL_COMMUNITY)
Admission: EM | Admit: 2013-06-06 | Discharge: 2013-06-06 | Disposition: A | Discharge status: Home / Self Care | Payer: Medicare Other | Attending: Emergency Medicine | Admitting: Emergency Medicine

## 2013-06-06 ENCOUNTER — Encounter (HOSPITAL_COMMUNITY): Payer: Self-pay | Admitting: *Deleted

## 2013-06-06 ENCOUNTER — Encounter: Payer: Self-pay | Admitting: Family Medicine

## 2013-06-06 VITALS — BP 191/105 | HR 77 | Temp 98.2°F | Wt 162.0 lb

## 2013-06-06 DIAGNOSIS — Z79899 Other long term (current) drug therapy: Secondary | ICD-10-CM | POA: Insufficient documentation

## 2013-06-06 DIAGNOSIS — I10 Essential (primary) hypertension: Secondary | ICD-10-CM | POA: Insufficient documentation

## 2013-06-06 DIAGNOSIS — Z9861 Coronary angioplasty status: Secondary | ICD-10-CM | POA: Insufficient documentation

## 2013-06-06 DIAGNOSIS — R011 Cardiac murmur, unspecified: Secondary | ICD-10-CM | POA: Insufficient documentation

## 2013-06-06 DIAGNOSIS — Z862 Personal history of diseases of the blood and blood-forming organs and certain disorders involving the immune mechanism: Secondary | ICD-10-CM | POA: Insufficient documentation

## 2013-06-06 DIAGNOSIS — Z8701 Personal history of pneumonia (recurrent): Secondary | ICD-10-CM | POA: Insufficient documentation

## 2013-06-06 DIAGNOSIS — E1149 Type 2 diabetes mellitus with other diabetic neurological complication: Secondary | ICD-10-CM | POA: Insufficient documentation

## 2013-06-06 DIAGNOSIS — Z8719 Personal history of other diseases of the digestive system: Secondary | ICD-10-CM | POA: Insufficient documentation

## 2013-06-06 DIAGNOSIS — E1142 Type 2 diabetes mellitus with diabetic polyneuropathy: Secondary | ICD-10-CM | POA: Insufficient documentation

## 2013-06-06 DIAGNOSIS — R112 Nausea with vomiting, unspecified: Secondary | ICD-10-CM | POA: Diagnosis not present

## 2013-06-06 DIAGNOSIS — K3184 Gastroparesis: Secondary | ICD-10-CM | POA: Insufficient documentation

## 2013-06-06 DIAGNOSIS — F411 Generalized anxiety disorder: Secondary | ICD-10-CM | POA: Insufficient documentation

## 2013-06-06 DIAGNOSIS — I252 Old myocardial infarction: Secondary | ICD-10-CM | POA: Insufficient documentation

## 2013-06-06 DIAGNOSIS — F17200 Nicotine dependence, unspecified, uncomplicated: Secondary | ICD-10-CM | POA: Insufficient documentation

## 2013-06-06 DIAGNOSIS — F3289 Other specified depressive episodes: Secondary | ICD-10-CM | POA: Insufficient documentation

## 2013-06-06 DIAGNOSIS — R51 Headache: Secondary | ICD-10-CM | POA: Insufficient documentation

## 2013-06-06 DIAGNOSIS — Z90710 Acquired absence of both cervix and uterus: Secondary | ICD-10-CM | POA: Insufficient documentation

## 2013-06-06 DIAGNOSIS — F1210 Cannabis abuse, uncomplicated: Secondary | ICD-10-CM | POA: Insufficient documentation

## 2013-06-06 DIAGNOSIS — R6889 Other general symptoms and signs: Secondary | ICD-10-CM | POA: Diagnosis not present

## 2013-06-06 DIAGNOSIS — R1084 Generalized abdominal pain: Secondary | ICD-10-CM | POA: Insufficient documentation

## 2013-06-06 DIAGNOSIS — F329 Major depressive disorder, single episode, unspecified: Secondary | ICD-10-CM | POA: Insufficient documentation

## 2013-06-06 DIAGNOSIS — R1115 Cyclical vomiting syndrome unrelated to migraine: Secondary | ICD-10-CM | POA: Insufficient documentation

## 2013-06-06 DIAGNOSIS — Z794 Long term (current) use of insulin: Secondary | ICD-10-CM | POA: Insufficient documentation

## 2013-06-06 DIAGNOSIS — Z9089 Acquired absence of other organs: Secondary | ICD-10-CM | POA: Insufficient documentation

## 2013-06-06 HISTORY — DX: Nausea with vomiting, unspecified: R11.2

## 2013-06-06 MED ORDER — MORPHINE SULFATE 10 MG/ML IJ SOLN
4.0000 mg | Freq: Once | INTRAMUSCULAR | Status: AC
  Administered 2013-06-06: 4 mg via INTRAMUSCULAR

## 2013-06-06 MED ORDER — PROMETHAZINE HCL 25 MG/ML IJ SOLN
25.0000 mg | Freq: Once | INTRAMUSCULAR | Status: AC
  Administered 2013-06-06: 25 mg via INTRAMUSCULAR

## 2013-06-06 MED ORDER — HYDROMORPHONE HCL PF 1 MG/ML IJ SOLN
1.0000 mg | Freq: Once | INTRAMUSCULAR | Status: AC
  Administered 2013-06-06: 1 mg via INTRAVENOUS
  Filled 2013-06-06: qty 1

## 2013-06-06 MED ORDER — PROMETHAZINE HCL 25 MG/ML IJ SOLN
25.0000 mg | Freq: Once | INTRAMUSCULAR | Status: AC
  Administered 2013-06-06: 25 mg via INTRAVENOUS
  Filled 2013-06-06: qty 1

## 2013-06-06 MED ORDER — SUMATRIPTAN SUCCINATE 50 MG PO TABS: 50.0000 mg | ORAL_TABLET | ORAL | Status: DC | PRN

## 2013-06-06 MED ORDER — SODIUM CHLORIDE 0.9 % IV BOLUS (SEPSIS)
1000.0000 mL | Freq: Once | INTRAVENOUS | Status: AC
  Administered 2013-06-06: 1000 mL via INTRAVENOUS

## 2013-06-06 NOTE — ED Notes (Signed)
Pt states understanding of discharge instructions 

## 2013-06-06 NOTE — Assessment & Plan Note (Signed)
May be cyclical vomiting flare; may be migraines (she has headache right sided headache with her past 6 episodes per patient).  -Phenergan 25 IM and morphine 4 IM now -Try Imitrex -Continue Ativan/phenergan at home every 6 hours -Follow-up in 1 week or sooner if symptoms worsen or go to ED

## 2013-06-06 NOTE — Discharge Instructions (Signed)
As discussed, it is important that you follow up as soon as possible with your physician for continued management of your condition.  If you develop any new, or concerning changes in your condition, please return to the emergency department immediately.  Cyclic Vomiting Syndrome Cyclic vomiting syndrome is a benign condition in which patients experience bouts or cycles of severe nausea and vomiting that last for hours or even days. The bouts of nausea and vomiting alternate with longer periods of no symptoms and generally good health. Cyclic vomiting syndrome occurs mostly in children, but can affect adults. CAUSES  [facility] has no known cause. Each episode is typically similar to the previous ones. The episodes tend to:   Start at about the same time of day.  Last the same length of time.  Present the same symptoms at the same level of intensity. Cyclic vomiting syndrome can begin at any age in children and adults. Cyclic vomiting syndrome usually starts between the ages of 3 and 7 years. In adults, episodes tend to occur less often than they do in children, but they last longer. Furthermore, the events or situations that trigger episodes in adults cannot always be pinpointed as easily as they can in children. There are 4 phases of cyclic vomiting syndrome: 1. Prodrome. The prodrome phase signals that an episode of nausea and vomiting is about to begin. This phase can last from just a few minutes to several hours. This phase is often marked by belly (abdominal) pain. Sometimes taking medicine early in the prodrome phase can stop an episode in progress. However, sometimes there is no warning. A person may simply wake up in the middle of the night or early morning and begin vomiting. 2. Episode. The episode phase consists of:  Severe vomiting.  Nausea.  Gagging (retching). 3. Recovery. The recovery phase begins when the nausea and vomiting stop. Healthy color, appetite, and energy  return. 4. Symptom-free interval. The symptom-free interval phase is the period between episodes when no symptoms are present. TRIGGERS Episodes can be triggered by an infection or event. Examples of triggers include:  Infections.  Colds, allergies, sinus problems, and the flu.  Eating certain foods such as chocolate or cheese.  Foods with monosodium glutamate (MSG) or preservatives.  Fast foods.  Pre-packaged foods.  Foods with low nutritional value (junk foods).  Overeating.  Eating just before going to bed.  Hot weather.  Dehydration.  Not enough sleep or poor sleep quality.  Physical exhaustion.  Menstruation.  Motion sickness.  Emotional stress (school or home difficulties).  Excitement or stress. SYMPTOMS  The main symptoms of cyclic vomiting syndrome are:  Severe vomiting.  Nausea.  Gagging (retching). Episodes usually begin at night or the first thing in the morning. Episodes may include vomiting or retching up to 5 or 6 times an hour during the worst of the episode. Episodes usually last anywhere from 1 to 4 days. Episodes can last for up to 10 days. Other symptoms include:  Paleness.  Exhaustion.  Listlessness.  Abdominal pain.  Loose stools or diarrhea. Sometimes the nausea and vomiting are so severe that a person appears to be almost unconscious. Sensitivity to light, headache, fever, dizziness, may also accompany an episode. In addition, the vomiting may cause drooling and excessive thirst. Drinking water usually leads to more vomiting, though the water can dilute the acid in the vomit, making the episode a little less painful. Continuous vomiting can lead to dehydration, which means that the body has lost excessive  water and salts. DIAGNOSIS  Cyclic vomiting syndrome is hard to diagnose because there are no clear tests to identify it. A caregiver must diagnose cyclic vomiting syndrome by looking at symptoms and medical history. A caregiver must  exclude more common diseases or disorders that can also cause nausea and vomiting. Also, diagnosis takes time because caregivers need to identify a pattern or cycle to the vomiting. TREATMENT  Cyclic vomiting syndrome cannot be cured. Treatment varies, but people with cyclic vomiting syndrome should get plenty of rest and sleep and take medications that prevent, stop, or lessen the vomiting episodes and other symptoms. People whose episodes are frequent and long-lasting may be treated during the symptom-free intervals in an effort to prevent or ease future episodes. The symptom-free phase is a good time to eliminate anything known to trigger an episode. For example, if episodes are brought on by stress or excitement, this period is the time to find ways to reduce stress and stay calm. If sinus problems or allergies cause episodes, those conditions should be treated. The triggers listed above should be avoided or prevented. Because of the similarities between migraine and cyclic vomiting syndrome, caregivers treat some people with severe cyclic vomiting syndrome with drugs that are also used for migraine headaches. The drugs are designed to:  Prevent episodes.  Reduce their frequency.  Lessen their severity. HOME CARE INSTRUCTIONS Once a vomiting episode begins, treatment is supportive. It helps to stay in bed and sleep in a dark, quiet room. Severe nausea and vomiting may require hospitalization and intravenous (IV) fluids to prevent dehydration. Relaxing medications (sedatives) may help if the nausea continues. Sometimes, during the prodrome phase, it is possible to stop an episode from happening altogether. Only take over-the-counter or prescription medicines for pain, discomfort or fever as directed by your caregiver. Do not give aspirin to children. During the recovery phase, drinking water and replacing lost electrolytes (salts in the blood) are very important. Electrolytes are salts that the body  needs to function well and stay healthy. Symptoms during the recovery phase can vary. Some people find that their appetites return to normal immediately, while others need to begin by drinking clear liquids and then move slowly to solid food. RELATED COMPLICATIONS The severe vomiting that defines cyclic vomiting syndrome is a risk factor for several complications:  Dehydration Vomiting causes the body to lose water quickly.  Electrolyte imbalance Vomiting also causes the body to lose the important salts it needs to keep working properly.  Peptic esophagitis The tube that connects the mouth to the stomach (esophagus) becomes injured from the stomach acid that comes up with the vomit.  Hematemesis The esophagus becomes irritated and bleeds, so blood mixes with the vomit.  Mallory-Weiss tear The lower end of the esophagus may tear open or the stomach may bruise from vomiting or retching.  Tooth decay The acid in the vomit can hurt the teeth by corroding the tooth enamel. SEEK MEDICAL CARE IF: You have questions or problems. Document Released: 02/21/2002 Document Revised: 03/06/2012 Document Reviewed: 03/22/2011 [facility] Patient Information 2014 [place], [place].

## 2013-06-06 NOTE — ED Notes (Signed)
Per EMS- pt has hx of cyclic vomitting symdrome. Pt was seen at PCP today for same received phenegran and morphine there with no relief. Pt received 4mg  zofran en route with EMS. No relief. Pt states to EMS "i just have to sleep it off"

## 2013-06-06 NOTE — ED Provider Notes (Signed)
History     CSN: [id number]  Arrival date & time 06/06/13  63   First MD Initiated Contact with Patient 06/06/13 1723      Chief Complaint  Patient presents with  . Emesis    (Consider location/radiation/quality/duration/timing/severity/associated sxs/prior treatment) HPI Patient presents with concern for abdominal pain, nausea, vomiting, headache. She states that she has a history of cyclic vomiting syndrome. This episode began earlier today, without clear precipitant. Since onset she said persistent diffuse crampy abdominal pain with nausea.  Multiple episodes of emesis, or diarrhea.  There is associated diffuse headache, without confusion, disorientation, visual changes. No relief with anything, including EMS medication.  Past Medical History  Diagnosis Date  . PANCREATITIS 05/09/2008  . Cyclic vomiting syndrome   . Hypertension   . Complication of anesthesia     "problems waking up"  . Heart murmur   . Myocardial infarction 07/2007    "they say I've had a silent one; I don't know"  . Pneumonia   . Anemia   . Blood transfusion   . GERD (gastroesophageal reflux disease)   . Anxiety   . Depression   . Smoker   . Marijuana smoker, continuous   . NEUROPATHY, DIABETIC 02/23/2007  . Type II diabetes mellitus   . Diabetic gastroparesis     /e-chart    Past Surgical History  Procedure Laterality Date  . Port-a-cath placement  ~ 2008    right chest; "poor access; frequent hospitalizations"  . Laparotomy and lysis of adhesions    . Abdominal hysterectomy  02/2002  . Cholecystectomy  1980's  . Cardiac catheterization    . Dilation and evacuation  X3  . Left hand surgery    . Cataract extraction w/ intraocular lens  implant, bilateral    . Colonoscopy    . Upper gi endoscopy      Family History  Problem Relation Age of Onset  . Diabetes type II Mother   . Diabetes Mother   . Hypertension Mother   . Diabetes type II Sister   . Diabetes Sister   . Hypertension  Sister   . Hypertension Brother     History  Substance Use Topics  . Smoking status: Current Some Day Smoker -- 0.50 packs/day for 22 years    Types: Cigarettes    Last Attempt to Quit: 02/22/2012  . Smokeless tobacco: Never Used  . Alcohol Use: No    OB History   Grav Para Term Preterm Abortions TAB SAB Ect Mult Living                  Review of Systems  Constitutional:       Per HPI, otherwise negative  HENT:       Per HPI, otherwise negative  Respiratory:       Per HPI, otherwise negative  Cardiovascular:       Per HPI, otherwise negative  Gastrointestinal: Positive for nausea, vomiting and abdominal pain.  Endocrine:       Negative aside from HPI  Genitourinary:       Neg aside from HPI   Musculoskeletal:       Per HPI, otherwise negative  Skin: Negative.   Neurological: Positive for headaches. Negative for syncope and weakness.    Allergies  Bee venom; Erythromycin; Acetaminophen; and Novolog  Home Medications   Current Outpatient Rx  Name  Route  Sig  Dispense  Refill  . aspirin EC 81 MG tablet   Oral  Take 1 tablet (81 mg total) by mouth daily.   90 tablet   3   . carvedilol (COREG) 25 MG tablet   Oral   Take 25 mg by mouth 2 (two) times daily with a meal.         . diclofenac sodium (VOLTAREN) 1 % GEL   Topical   Apply 2 g topically 4 (four) times daily as needed (pain).         Marland Kitchen EPINEPHrine (EPIPEN) 0.3 mg/0.3 mL DEVI   Intramuscular   Inject 0.3 mg into the muscle once as needed (for allegic reactions).         . insulin glargine (LANTUS) 100 UNIT/ML injection   Subcutaneous   Inject 20 Units into the skin every morning.          . lidocaine (LIDODERM) 5 %   Transdermal   Place 1 patch onto the skin daily as needed (for pain). Remove & Discard patch within 12 hours or as directed by MD         . lisinopril (PRINIVIL,ZESTRIL) 40 MG tablet   Oral   Take 1 tablet (40 mg total) by mouth daily.   30 tablet   11   .  LORazepam (ATIVAN) 1 MG tablet   Oral   Take 1 mg by mouth every 6 (six) hours as needed (nausea).         Marland Kitchen omeprazole (PRILOSEC) 40 MG capsule   Oral   Take 40 mg by mouth daily.         . promethazine (PHENERGAN) 25 MG tablet   Oral   Take 1 tablet (25 mg total) by mouth every 8 (eight) hours as needed for nausea.   12 tablet   0   . sucralfate (CARAFATE) 1 G tablet   Oral   Take 1 g by mouth 4 (four) times daily as needed (nausea/indigestion).         . SUMAtriptan (IMITREX) 50 MG tablet   Oral   Take 1 tablet (50 mg total) by mouth every 2 (two) hours as needed for migraine. Do not take more than 4 tablets a day   10 tablet   0   . ULTICARE INSULIN SYRINGE 31G X 5/16" 0.3 ML MISC                 BP 227/123  Pulse 117  Temp(Src) 98.2 F (36.8 C) (Oral)  SpO2 100%  Physical Exam  Vitals reviewed. Constitutional: She is oriented to person, place, and time. She appears well-developed and well-nourished.  HENT:  Head: Normocephalic and atraumatic.  Right Ear: External ear normal.  Left Ear: External ear normal.  Eyes: Conjunctivae and EOM are normal. Pupils are equal, round, and reactive to light.  Neck: Normal range of motion. Neck supple.  Cardiovascular: Normal rate, regular rhythm, normal heart sounds and intact distal pulses.   Pulmonary/Chest: Effort normal and breath sounds normal.  Abdominal: Soft. Bowel sounds are normal. There is tenderness in the epigastric area.  Musculoskeletal: Normal range of motion.  Neurological: She is alert and oriented to person, place, and time.  Skin: Skin is warm and dry.    ED Course  Procedures (including critical care time)  Labs Reviewed - No data to display No results found.   No diagnosis found.  On several repeat exam the patient was sleeping.  MDM  Patient with cyclic vomiting syndrome presents with a typical exacerbation.  On exam she is hypertensive, but  otherwise hemodynamically stable with a  soft abdomen. Following IV fluids, analgesics, antiemetics, the patient was sleeping.  Given the patient's history of recurrent episodes, the soft abdomen, there is low suspicion for acute new pathology.  With improvement she was discharged in stable condition.  Carmin Muskrat, MD 06/06/13 2030

## 2013-06-06 NOTE — Progress Notes (Signed)
  Subjective:    Patient ID: [name] [name], female    DOB: 08/23/1966, 47 y.o.   MRN: [id number]  HPI # Cyclical vomiting flare  Started this morning 5:45 am with nausea; vomiting saliva now  She has had a headache the past 6 episodes     A lot of tension and a lot of pressure the back of her neck as well as right sided pain on the top of her head; she endorses headache at this time; endorses photo and phonophobia   She took an Ativan; she did not take a Phenergan because she did not start throwing-up  Last marijuana 1.5-2 weeks ago   BS: yesterday fasting 137  Review of Systems Denies fevers, chills, chest pain, dyspnea  Allergies, medication, past medical history reviewed.  Smoking status noted.     Objective:   Physical Exam GEN: appears uncomfortable  ABD: soft, epigastric tenderness without guarding or rebound NEURO: moves all extremities well; alert and oriented PSYCH: tearful, frustrated    Assessment & Plan:

## 2013-06-06 NOTE — Patient Instructions (Addendum)
Try Imitrex   When you get a flare -Take Ativan -Take Phenergan (even if you don't have vomiting at that time)  Phenergan and morphine shot today   Follow-up in 1 week or sooner

## 2013-06-06 NOTE — Addendum Note (Signed)
Addended by: Valerie Roys on: 06/06/2013 11:58 AM   Modules accepted: Orders

## 2013-06-08 ENCOUNTER — Encounter (HOSPITAL_COMMUNITY): Payer: Self-pay | Admitting: Emergency Medicine

## 2013-06-08 ENCOUNTER — Emergency Department (HOSPITAL_COMMUNITY)
Admission: EM | Admit: 2013-06-08 | Discharge: 2013-06-09 | Disposition: A | Discharge status: Home / Self Care | Payer: Medicare Other | Attending: Emergency Medicine | Admitting: Emergency Medicine

## 2013-06-08 ENCOUNTER — Emergency Department (HOSPITAL_COMMUNITY)
Admission: EM | Admit: 2013-06-08 | Discharge: 2013-06-08 | Disposition: A | Discharge status: Home / Self Care | Payer: Medicare Other | Source: Home / Self Care | Attending: Emergency Medicine | Admitting: Emergency Medicine

## 2013-06-08 ENCOUNTER — Encounter (HOSPITAL_COMMUNITY): Payer: Self-pay | Admitting: *Deleted

## 2013-06-08 DIAGNOSIS — R109 Unspecified abdominal pain: Secondary | ICD-10-CM

## 2013-06-08 DIAGNOSIS — F411 Generalized anxiety disorder: Secondary | ICD-10-CM | POA: Insufficient documentation

## 2013-06-08 DIAGNOSIS — Z9089 Acquired absence of other organs: Secondary | ICD-10-CM | POA: Insufficient documentation

## 2013-06-08 DIAGNOSIS — R112 Nausea with vomiting, unspecified: Secondary | ICD-10-CM | POA: Insufficient documentation

## 2013-06-08 DIAGNOSIS — F329 Major depressive disorder, single episode, unspecified: Secondary | ICD-10-CM | POA: Insufficient documentation

## 2013-06-08 DIAGNOSIS — K3184 Gastroparesis: Secondary | ICD-10-CM | POA: Insufficient documentation

## 2013-06-08 DIAGNOSIS — Z794 Long term (current) use of insulin: Secondary | ICD-10-CM | POA: Insufficient documentation

## 2013-06-08 DIAGNOSIS — K219 Gastro-esophageal reflux disease without esophagitis: Secondary | ICD-10-CM | POA: Insufficient documentation

## 2013-06-08 DIAGNOSIS — I10 Essential (primary) hypertension: Secondary | ICD-10-CM | POA: Diagnosis not present

## 2013-06-08 DIAGNOSIS — Z862 Personal history of diseases of the blood and blood-forming organs and certain disorders involving the immune mechanism: Secondary | ICD-10-CM | POA: Insufficient documentation

## 2013-06-08 DIAGNOSIS — F3289 Other specified depressive episodes: Secondary | ICD-10-CM | POA: Insufficient documentation

## 2013-06-08 DIAGNOSIS — R1013 Epigastric pain: Secondary | ICD-10-CM | POA: Diagnosis not present

## 2013-06-08 DIAGNOSIS — Z8719 Personal history of other diseases of the digestive system: Secondary | ICD-10-CM | POA: Insufficient documentation

## 2013-06-08 DIAGNOSIS — G8929 Other chronic pain: Secondary | ICD-10-CM | POA: Insufficient documentation

## 2013-06-08 DIAGNOSIS — R011 Cardiac murmur, unspecified: Secondary | ICD-10-CM | POA: Insufficient documentation

## 2013-06-08 DIAGNOSIS — E1149 Type 2 diabetes mellitus with other diabetic neurological complication: Secondary | ICD-10-CM | POA: Insufficient documentation

## 2013-06-08 DIAGNOSIS — I252 Old myocardial infarction: Secondary | ICD-10-CM | POA: Insufficient documentation

## 2013-06-08 DIAGNOSIS — F17200 Nicotine dependence, unspecified, uncomplicated: Secondary | ICD-10-CM | POA: Insufficient documentation

## 2013-06-08 DIAGNOSIS — E119 Type 2 diabetes mellitus without complications: Secondary | ICD-10-CM | POA: Insufficient documentation

## 2013-06-08 DIAGNOSIS — R1115 Cyclical vomiting syndrome unrelated to migraine: Secondary | ICD-10-CM | POA: Insufficient documentation

## 2013-06-08 DIAGNOSIS — F1210 Cannabis abuse, uncomplicated: Secondary | ICD-10-CM | POA: Insufficient documentation

## 2013-06-08 DIAGNOSIS — Z9889 Other specified postprocedural states: Secondary | ICD-10-CM | POA: Insufficient documentation

## 2013-06-08 DIAGNOSIS — Z8701 Personal history of pneumonia (recurrent): Secondary | ICD-10-CM | POA: Insufficient documentation

## 2013-06-08 DIAGNOSIS — R000 Tachycardia, unspecified: Secondary | ICD-10-CM | POA: Insufficient documentation

## 2013-06-08 DIAGNOSIS — E1142 Type 2 diabetes mellitus with diabetic polyneuropathy: Secondary | ICD-10-CM | POA: Insufficient documentation

## 2013-06-08 DIAGNOSIS — Z79899 Other long term (current) drug therapy: Secondary | ICD-10-CM | POA: Insufficient documentation

## 2013-06-08 DIAGNOSIS — Z7982 Long term (current) use of aspirin: Secondary | ICD-10-CM | POA: Insufficient documentation

## 2013-06-08 DIAGNOSIS — R61 Generalized hyperhidrosis: Secondary | ICD-10-CM | POA: Insufficient documentation

## 2013-06-08 DIAGNOSIS — Z90710 Acquired absence of both cervix and uterus: Secondary | ICD-10-CM | POA: Insufficient documentation

## 2013-06-08 LAB — POCT I-STAT, CHEM 8
BUN: 29 mg/dL — ABNORMAL HIGH (ref 6–23)
Calcium, Ion: 1.1 mmol/L — ABNORMAL LOW (ref 1.12–1.23)
Chloride: 111 mEq/L (ref 96–112)
Creatinine, Ser: 2.1 mg/dL — ABNORMAL HIGH (ref 0.50–1.10)
Glucose, Bld: 215 mg/dL — ABNORMAL HIGH (ref 70–99)
TCO2: 22 mmol/L (ref 0–100)

## 2013-06-08 MED ORDER — DIPHENHYDRAMINE HCL 50 MG/ML IJ SOLN
25.0000 mg | Freq: Once | INTRAMUSCULAR | Status: AC
  Administered 2013-06-09: 25 mg via INTRAVENOUS
  Filled 2013-06-08: qty 1

## 2013-06-08 MED ORDER — SODIUM CHLORIDE 0.9 % IV BOLUS (SEPSIS)
1000.0000 mL | Freq: Once | INTRAVENOUS | Status: AC
  Administered 2013-06-08: 1000 mL via INTRAVENOUS

## 2013-06-08 MED ORDER — KETOROLAC TROMETHAMINE 30 MG/ML IJ SOLN
30.0000 mg | Freq: Once | INTRAMUSCULAR | Status: AC
  Administered 2013-06-09: 30 mg via INTRAVENOUS
  Filled 2013-06-08: qty 1

## 2013-06-08 MED ORDER — PROMETHAZINE HCL 25 MG/ML IJ SOLN
25.0000 mg | Freq: Once | INTRAMUSCULAR | Status: AC
  Administered 2013-06-08: 25 mg via INTRAVENOUS
  Filled 2013-06-08: qty 1

## 2013-06-08 MED ORDER — DICYCLOMINE HCL 10 MG/ML IM SOLN
20.0000 mg | Freq: Once | INTRAMUSCULAR | Status: AC
  Administered 2013-06-09: 20 mg via INTRAMUSCULAR
  Filled 2013-06-08: qty 2

## 2013-06-08 MED ORDER — SODIUM CHLORIDE 0.9 % IJ SOLN
10.0000 mL | INTRAMUSCULAR | Status: DC | PRN
  Administered 2013-06-08: 10 mL

## 2013-06-08 MED ORDER — HYDROMORPHONE HCL PF 1 MG/ML IJ SOLN
1.0000 mg | Freq: Once | INTRAMUSCULAR | Status: AC
  Administered 2013-06-08: 1 mg via INTRAVENOUS
  Filled 2013-06-08: qty 1

## 2013-06-08 MED ORDER — LORAZEPAM 2 MG/ML IJ SOLN
1.0000 mg | Freq: Once | INTRAMUSCULAR | Status: AC
  Administered 2013-06-09: 1 mg via INTRAVENOUS
  Filled 2013-06-08: qty 1

## 2013-06-08 MED ORDER — PROMETHAZINE HCL 6.25 MG/5ML PO SYRP: 12.5000 mg | ORAL_SOLUTION | Freq: Four times a day (QID) | ORAL | Status: DC | PRN

## 2013-06-08 MED ORDER — HEPARIN SOD (PORK) LOCK FLUSH 100 UNIT/ML IV SOLN
500.0000 [IU] | INTRAVENOUS | Status: AC | PRN
  Administered 2013-06-08: 500 [IU]

## 2013-06-08 MED ORDER — DICYCLOMINE HCL 20 MG PO TABS: 20.0000 mg | ORAL_TABLET | Freq: Two times a day (BID) | ORAL | Status: DC

## 2013-06-08 MED ORDER — OXYCODONE HCL 5 MG PO TABS
10.0000 mg | ORAL_TABLET | Freq: Once | ORAL | Status: AC
  Administered 2013-06-08: 10 mg via ORAL
  Filled 2013-06-08: qty 2

## 2013-06-08 NOTE — ED Notes (Signed)
Pt unable to sit still to get an accurate blood pressure

## 2013-06-08 NOTE — ED Notes (Signed)
IV team at bedside to deaccess port.  ?

## 2013-06-08 NOTE — ED Notes (Signed)
Pt arrived via POV with a complaint of emesis.  Pt was seen here early today and has returned due to no relief of symptoms.  Pt presents tonight with hypertension as well as emesis.  Pt has emesis bag with small amount present in bag.  Pt appears to be dry heaving at present moment.

## 2013-06-08 NOTE — ED Notes (Signed)
Pt blood sugar is 260

## 2013-06-08 NOTE — ED Notes (Addendum)
Port de accessed by IV team. Explained to pt what Delos Haring, PA and Dr Christy Gentles said about following up with PCP. Asked pt if she would like to have the providers come back in the room, pt states that she does not need to speak with them again and will go home. Pt given d/c teaching and follow up care instructions with prescriptions. Pt verbalizes understanding and has no further questions upon d/c teaching. NAD noted. Pt instructed not to drive. Pt endorses that she will not drive home and states her boyfriend is coming to get her from ER. Pt ambulatory in room upon d/c.

## 2013-06-08 NOTE — ED Notes (Signed)
IV team called to access [place].

## 2013-06-08 NOTE — ED Provider Notes (Signed)
History     CSN: [id number]  Arrival date & time 06/08/13  0840   First MD Initiated Contact with Patient 06/08/13 [phone]      Chief Complaint  Patient presents with  . Nausea  . Emesis  . Abdominal Pain    (Consider location/radiation/quality/duration/timing/severity/associated sxs/prior treatment) HPI  [name] [name] is a 47 y.o.female presenting to the ER with complaints of abdominal epigastric pain, nausea,  Vomiting. Pt has cyclical vomiting syndrome and is here very frequently. Last seen two days ago for the same. She see's her PCP every couple of days for the same as well. Her PCP does not want the patient on narcotic patient medication since this is a chronic/daily problem. She takes Ativan and Phenergan. It is believed that the exacerbations are caused by stressors and she tells me today that she opened up her light bill two weeks ago and it was very very high. She says that she saw a GI doctor, Dr. Henrene Pastor and he eventually told her he couldn't do anything for her and to see her PCP from now on. She is not vomiting in the room. She is crying and writhing with pain.   Past Medical History  Diagnosis Date  . PANCREATITIS 05/09/2008  . Cyclic vomiting syndrome   . Hypertension   . Complication of anesthesia     "problems waking up"  . Heart murmur   . Myocardial infarction 07/2007    "they say I've had a silent one; I don't know"  . Pneumonia   . Anemia   . Blood transfusion   . GERD (gastroesophageal reflux disease)   . Anxiety   . Depression   . Smoker   . Marijuana smoker, continuous   . NEUROPATHY, DIABETIC 02/23/2007  . Type II diabetes mellitus   . Diabetic gastroparesis     /e-chart    Past Surgical History  Procedure Laterality Date  . Port-a-cath placement  ~ 2008    right chest; "poor access; frequent hospitalizations"  . Laparotomy and lysis of adhesions    . Abdominal hysterectomy  02/2002  . Cholecystectomy  1980's  . Cardiac catheterization    .  Dilation and evacuation  X3  . Left hand surgery    . Cataract extraction w/ intraocular lens  implant, bilateral    . Colonoscopy    . Upper gi endoscopy      Family History  Problem Relation Age of Onset  . Diabetes type II Mother   . Diabetes Mother   . Hypertension Mother   . Diabetes type II Sister   . Diabetes Sister   . Hypertension Sister   . Hypertension Brother     History  Substance Use Topics  . Smoking status: Current Some Day Smoker -- 0.50 packs/day for 22 years    Types: Cigarettes    Last Attempt to Quit: 02/22/2012  . Smokeless tobacco: Never Used  . Alcohol Use: No    OB History   Grav Para Term Preterm Abortions TAB SAB Ect Mult Living                  Review of Systems Constitutional: Negative for fever and chills.  HENT: Negative for congestion, sore throat and rhinorrhea.  Eyes: Negative for photophobia and visual disturbance.  Respiratory: Negative for cough and shortness of breath.  Cardiovascular: Negative for chest pain and leg swelling.  Gastrointestinal: Positive for nausea, vomiting and abdominal pain. Negative for diarrhea and constipation.  Endocrine: Negative  for polyphagia and polyuria.  Genitourinary: Negative for dysuria, flank pain, vaginal bleeding, vaginal discharge and enuresis.  Musculoskeletal: Negative for back pain and gait problem.  Skin: Negative for color change and rash.  Neurological: . Negative for dizziness, vertigo, syncope, light-headedness and numbness.  Hematological: Negative for adenopathy. Does not bruise/bleed easily.  All other systems reviewed and are negative.  Allergies  Bee venom; Erythromycin; Acetaminophen; and Novolog  Home Medications   Current Outpatient Rx  Name  Route  Sig  Dispense  Refill  . aspirin EC 81 MG tablet   Oral   Take 1 tablet (81 mg total) by mouth daily.   90 tablet   3   . diclofenac sodium (VOLTAREN) 1 % GEL   Topical   Apply 2 g topically 4 (four) times daily as  needed (pain).         . insulin glargine (LANTUS) 100 UNIT/ML injection   Subcutaneous   Inject 20 Units into the skin every morning.          Marland Kitchen LORazepam (ATIVAN) 1 MG tablet   Oral   Take 1 mg by mouth every 6 (six) hours as needed (nausea).         . promethazine (PHENERGAN) 25 MG tablet   Oral   Take 1 tablet (25 mg total) by mouth every 8 (eight) hours as needed for nausea.   12 tablet   0   . carvedilol (COREG) 25 MG tablet   Oral   Take 25 mg by mouth 2 (two) times daily with a meal.         . dicyclomine (BENTYL) 20 MG tablet   Oral   Take 1 tablet (20 mg total) by mouth 2 (two) times daily.   20 tablet   0   . EPINEPHrine (EPIPEN) 0.3 mg/0.3 mL DEVI   Intramuscular   Inject 0.3 mg into the muscle once as needed (for allegic reactions).         . lidocaine (LIDODERM) 5 %   Transdermal   Place 1 patch onto the skin daily as needed (for pain). Remove & Discard patch within 12 hours or as directed by MD         . lisinopril (PRINIVIL,ZESTRIL) 40 MG tablet   Oral   Take 1 tablet (40 mg total) by mouth daily.   30 tablet   11   . promethazine (PHENERGAN) 6.25 MG/5ML syrup   Oral   Take 10 mLs (12.5 mg total) by mouth 4 (four) times daily as needed for nausea.   120 mL   0     BP 194/107  Pulse 110  Temp(Src) 98.6 F (37 C) (Oral)  Resp 22  SpO2 98%  Physical Exam Vitals reviewed.  Constitutional: She is oriented to person, place, and time. She appears well-developed and well-nourished.  HENT:  Head: Normocephalic and atraumatic.  Right Ear: External ear normal.  Left Ear: External ear normal.  Eyes: Conjunctivae and EOM are normal. Pupils are equal, round, and reactive to light.  Neck: Normal range of motion. Neck supple.  Cardiovascular: Normal rate, regular rhythm, normal heart sounds and intact distal pulses.  Pulmonary/Chest: Effort normal and breath sounds normal.  Abdominal: Soft. Bowel sounds are normal. There is tenderness in  the epigastric area. Abdomen is soft Musculoskeletal: Normal range of motion.  Neurological: She is alert and oriented to person, place, and time.  Skin: Skin is warm and dry.   ED Course  Procedures (including  critical care time)  Labs Reviewed  POCT I-STAT, CHEM 8 - Abnormal; Notable for the following:    BUN 29 (*)    Creatinine, Ser 2.10 (*)    Glucose, Bld 215 (*)    Calcium, Ion 1.10 (*)    Hemoglobin 11.2 (*)    HCT 33.0 (*)    All other components within normal limits   No results found.   1. Cyclical vomiting   2. Chronic abdominal pain       MDM   Discussed case with Dr. Christy Gentles. HEr PCP does not want her discharged with narcotic pain medication. Her port will be accessed by IV team and we will give her fluids and a round of pain medication.Will order chem 8 to check electrolytes.   11:00am- patients pain somewhat managed. Given IV saline bolus in the ED. Potassium is WNL.  Pt did not have any episodes of vomiting in the ED. Given liquid phenergan and bentyl at discharge. Pt requests one more dose of IV pain medication but given a dose of oral Oxycodone instead.  47 y.o.Bryn J Petterson's evaluation in the Emergency Department is complete. It has been determined that no acute conditions requiring further emergency intervention are present at this time. The patient/guardian have been advised of the diagnosis and plan. We have discussed signs and symptoms that warrant return to the ED, such as changes or worsening in symptoms.  Vital signs are stable at discharge.  Patient/guardian has voiced understanding and agreed to follow-up with the PCP or specialist.       Linus Mako, PA-C 06/08/13 1101

## 2013-06-09 LAB — URINALYSIS, ROUTINE W REFLEX MICROSCOPIC
Bilirubin Urine: NEGATIVE
Glucose, UA: 500 mg/dL — AB
Ketones, ur: 15 mg/dL — AB
Protein, ur: 100 mg/dL — AB

## 2013-06-09 LAB — CBC WITH DIFFERENTIAL/PLATELET
Basophils Absolute: 0 10*3/uL (ref 0.0–0.1)
Basophils Relative: 0 % (ref 0–1)
Eosinophils Relative: 0 % (ref 0–5)
HCT: 35 % — ABNORMAL LOW (ref 36.0–46.0)
MCH: 31.2 pg (ref 26.0–34.0)
MCHC: 35.1 g/dL (ref 30.0–36.0)
MCV: 88.8 fL (ref 78.0–100.0)
Monocytes Absolute: 0.4 10*3/uL (ref 0.1–1.0)
RDW: 12.9 % (ref 11.5–15.5)

## 2013-06-09 LAB — BASIC METABOLIC PANEL
CO2: 26 mEq/L (ref 19–32)
Calcium: 10 mg/dL (ref 8.4–10.5)
Creatinine, Ser: 1.78 mg/dL — ABNORMAL HIGH (ref 0.50–1.10)
Glucose, Bld: 286 mg/dL — ABNORMAL HIGH (ref 70–99)

## 2013-06-09 MED ORDER — LORAZEPAM 2 MG/ML IJ SOLN
1.0000 mg | Freq: Once | INTRAMUSCULAR | Status: AC
  Administered 2013-06-09: 1 mg via INTRAVENOUS
  Filled 2013-06-09: qty 1

## 2013-06-09 MED ORDER — LABETALOL HCL 5 MG/ML IV SOLN
20.0000 mg | Freq: Once | INTRAVENOUS | Status: AC
  Administered 2013-06-09: 20 mg via INTRAVENOUS
  Filled 2013-06-09: qty 4

## 2013-06-09 MED ORDER — SODIUM CHLORIDE 0.9 % IV SOLN: 1000.0000 mL | INTRAVENOUS | Status: DC

## 2013-06-09 MED ORDER — SODIUM CHLORIDE 0.9 % IV SOLN
1000.0000 mL | Freq: Once | INTRAVENOUS | Status: AC
  Administered 2013-06-09: 1000 mL via INTRAVENOUS

## 2013-06-09 MED ORDER — DIPHENHYDRAMINE HCL 50 MG/ML IJ SOLN
25.0000 mg | Freq: Once | INTRAMUSCULAR | Status: AC
  Administered 2013-06-09: 25 mg via INTRAVENOUS
  Filled 2013-06-09: qty 1

## 2013-06-09 MED ORDER — CLONIDINE HCL 0.1 MG PO TABS
0.2000 mg | ORAL_TABLET | Freq: Once | ORAL | Status: AC
  Administered 2013-06-09: 0.2 mg via ORAL
  Filled 2013-06-09: qty 2

## 2013-06-09 MED ORDER — PROMETHAZINE HCL 25 MG RE SUPP: 25.0000 mg | Freq: Four times a day (QID) | RECTAL | Status: DC | PRN

## 2013-06-09 MED ORDER — PROMETHAZINE HCL 25 MG/ML IJ SOLN
25.0000 mg | Freq: Once | INTRAMUSCULAR | Status: DC
  Filled 2013-06-09: qty 1

## 2013-06-09 MED ORDER — HEPARIN SOD (PORK) LOCK FLUSH 100 UNIT/ML IV SOLN
INTRAVENOUS | Status: AC
  Filled 2013-06-09: qty 5

## 2013-06-09 NOTE — ED Provider Notes (Signed)
History     CSN: [id number]  Arrival date & time 06/08/13  2231   First MD Initiated Contact with Patient 06/08/13 2304      Chief Complaint  Patient presents with  . Abdominal Pain    (Consider location/radiation/quality/duration/timing/severity/associated sxs/prior treatment) HPI 47 year old female presents to emergency room with complaint of vomiting, cyclical vomiting syndrome, abdominal pain.  Patient was seen in emergency department for same earlier today.  She is seen frequently in the ER.  Patient reports she has been unable to manage her symptoms at home, and returns.  Medical records reviewed.  Her primary care Dr. has recently stopped prescribing her pain medications, and recommend she go to the ER or to the clinic when she is having exacerbations.  Patient reports she was taking pain medicine for back and abdominal pain.  She is frustrated with her Dr. for stopping her pain medications.  Patient reports it been about 2 weeks since her last marijuana use.  No fevers no chills.  No cough no urinary symptoms.  Patient is concerned she may have a infection in her body causing her symptoms to worsen.  She reports she has a lot of stress in her life, which he feels is contributing to her symptoms.  Per notes, patient has been offered medications for depression and anxiety through her primary care doctor in the past, but has refused. Past Medical History  Diagnosis Date  . PANCREATITIS 05/09/2008  . Cyclic vomiting syndrome   . Hypertension   . Complication of anesthesia     "problems waking up"  . Heart murmur   . Myocardial infarction 07/2007    "they say I've had a silent one; I don't know"  . Pneumonia   . Anemia   . Blood transfusion   . GERD (gastroesophageal reflux disease)   . Anxiety   . Depression   . Smoker   . Marijuana smoker, continuous   . NEUROPATHY, DIABETIC 02/23/2007  . Type II diabetes mellitus   . Diabetic gastroparesis     /e-chart    Past Surgical  History  Procedure Laterality Date  . Port-a-cath placement  ~ 2008    right chest; "poor access; frequent hospitalizations"  . Laparotomy and lysis of adhesions    . Abdominal hysterectomy  02/2002  . Cholecystectomy  1980's  . Cardiac catheterization    . Dilation and evacuation  X3  . Left hand surgery    . Cataract extraction w/ intraocular lens  implant, bilateral    . Colonoscopy    . Upper gi endoscopy      Family History  Problem Relation Age of Onset  . Diabetes type II Mother   . Diabetes Mother   . Hypertension Mother   . Diabetes type II Sister   . Diabetes Sister   . Hypertension Sister   . Hypertension Brother     History  Substance Use Topics  . Smoking status: Current Some Day Smoker -- 0.50 packs/day for 22 years    Types: Cigarettes    Last Attempt to Quit: 02/22/2012  . Smokeless tobacco: Never Used  . Alcohol Use: No    OB History   Grav Para Term Preterm Abortions TAB SAB Ect Mult Living                  Review of Systems  See History of Present Illness; otherwise all other systems are reviewed and negative Allergies  Bee venom; Acetaminophen; Novolog; and Erythromycin  Home  Medications   Current Outpatient Rx  Name  Route  Sig  Dispense  Refill  . aspirin EC 81 MG tablet   Oral   Take 81 mg by mouth every morning.         . carvedilol (COREG) 25 MG tablet   Oral   Take 25 mg by mouth 2 (two) times daily with a meal.         . diclofenac sodium (VOLTAREN) 1 % GEL   Topical   Apply 2 g topically 4 (four) times daily as needed (pain).         Marland Kitchen dicyclomine (BENTYL) 20 MG tablet   Oral   Take 1 tablet (20 mg total) by mouth 2 (two) times daily.   20 tablet   0   . insulin glargine (LANTUS) 100 UNIT/ML injection   Subcutaneous   Inject 20 Units into the skin every morning.          . lidocaine (LIDODERM) 5 %   Transdermal   Place 1 patch onto the skin daily as needed (for pain). Remove & Discard patch within 12 hours or  as directed by MD         . lisinopril (PRINIVIL,ZESTRIL) 40 MG tablet   Oral   Take 40 mg by mouth every morning.         Marland Kitchen LORazepam (ATIVAN) 1 MG tablet   Oral   Take 1 mg by mouth every 6 (six) hours as needed (nausea).         . promethazine (PHENERGAN) 25 MG tablet   Oral   Take 1 tablet (25 mg total) by mouth every 8 (eight) hours as needed for nausea.   12 tablet   0   . promethazine (PHENERGAN) 6.25 MG/5ML syrup   Oral   Take 10 mLs (12.5 mg total) by mouth 4 (four) times daily as needed for nausea.   120 mL   0   . EPINEPHrine (EPIPEN) 0.3 mg/0.3 mL DEVI   Intramuscular   Inject 0.3 mg into the muscle once as needed (for allegic reactions).           BP 219/106  Pulse 128  Temp(Src) 98.4 F (36.9 C) (Oral)  Resp 24  SpO2 100%  Physical Exam  Nursing note and vitals reviewed. Constitutional: She appears distressed.  Hypertension noted.  Patient is tearful, rubbing her belly.  Patient moves easily on the bed  HENT:  Head: Normocephalic and atraumatic.  Nose: Nose normal.  Mouth/Throat: Oropharynx is clear and moist.  Eyes: Conjunctivae and EOM are normal. Pupils are equal, round, and reactive to light.  Neck: Normal range of motion. Neck supple. No JVD present. No tracheal deviation present. No thyromegaly present.  Cardiovascular: Regular rhythm, normal heart sounds and intact distal pulses.  Exam reveals no gallop and no friction rub.   No murmur heard. Tachycardia noted  Pulmonary/Chest: Effort normal and breath sounds normal. No stridor. No respiratory distress. She has no wheezes. She has no rales. She exhibits no tenderness.  Abdominal: Soft. She exhibits no distension and no mass. Tenderness: diffuse abdominal tenderness. There is no rebound and no guarding.  Hypoactive bowel sounds  Musculoskeletal: Normal range of motion. She exhibits no edema and no tenderness.  Lymphadenopathy:    She has no cervical adenopathy.  Skin: Skin is warm. No  rash noted. She is diaphoretic. No pallor.    ED Course  Procedures (including critical care time)  Labs Reviewed  BASIC  METABOLIC PANEL - Abnormal; Notable for the following:    Potassium 3.4 (*)    Glucose, Bld 286 (*)    BUN 24 (*)    Creatinine, Ser 1.78 (*)    GFR calc non Af Amer 33 (*)    GFR calc Af Amer 38 (*)    All other components within normal limits  URINALYSIS, ROUTINE W REFLEX MICROSCOPIC - Abnormal; Notable for the following:    APPearance CLOUDY (*)    Glucose, UA 500 (*)    Hgb urine dipstick MODERATE (*)    Ketones, ur 15 (*)    Protein, ur 100 (*)    All other components within normal limits  CBC WITH DIFFERENTIAL - Abnormal; Notable for the following:    HCT 35.0 (*)    All other components within normal limits  URINE MICROSCOPIC-ADD ON   No results found.   1. Abdominal pain, epigastric   2. Cyclic vomiting syndrome   3. Hypertension       MDM  47 year old female with presentation of her typical sickle to vomiting syndrome flare.  I explained to the patient, that I do not feel that narcotics were in her best interest, as she was given him on her visit earlier today, and symptoms returned.  Her doctor seems to feel the same way as she has not prescribed narcotics for home use.  I do not feel it's indicated in cyclical vomiting syndrome.  Will treat symptoms.  We'll give clonidine for her hypertension.  She may have an element of narcotic withdrawal, which may be contributed to her symptoms.  Expect clonidine, and Phenergan and Ativan to help with this withdrawal syndrome.      1:35 AM Pt reports pain is easing off.  No further vomiting.  Receiving ns boluses.  3:51 AM BP still elevated after clonidine.  Pt sleeping, no vomiting noted recently.  Prior history reviewed, has h/o hard to control bps.  Given vomiting today, pt most likely not taking medications.  No c/o headache, weakness, chest pain, sob.  Will give labetalol.  May have element of  narcotic withdrawal?  4:25 AM BP improved.  Pt woken, reports she is feeling better.  Will plan to d/c home to f/u with pcm.  Kalman Drape, MD 06/09/13 [phone]

## 2013-06-09 NOTE — ED Provider Notes (Signed)
Medical screening examination/treatment/procedure(s) were performed by non-physician practitioner and as supervising physician I was immediately available for consultation/collaboration.   Sharyon Cable, MD 06/09/13 [phone]

## 2013-06-11 LAB — GLUCOSE, CAPILLARY

## 2013-06-13 ENCOUNTER — Ambulatory Visit: Payer: Medicare Other | Admitting: Family Medicine

## 2013-06-20 ENCOUNTER — Encounter: Payer: Self-pay | Admitting: Family Medicine

## 2013-06-20 ENCOUNTER — Ambulatory Visit (INDEPENDENT_AMBULATORY_CARE_PROVIDER_SITE_OTHER): Payer: Medicare Other | Admitting: Family Medicine

## 2013-06-20 VITALS — BP 109/66 | HR 70 | Temp 98.6°F | Ht 61.5 in | Wt 160.0 lb

## 2013-06-20 DIAGNOSIS — R1115 Cyclical vomiting syndrome unrelated to migraine: Secondary | ICD-10-CM | POA: Diagnosis not present

## 2013-06-20 DIAGNOSIS — L299 Pruritus, unspecified: Secondary | ICD-10-CM | POA: Insufficient documentation

## 2013-06-20 MED ORDER — PROMETHAZINE HCL 25 MG PO TABS: 25.0000 mg | ORAL_TABLET | Freq: Three times a day (TID) | ORAL | Status: DC | PRN

## 2013-06-20 MED ORDER — LORAZEPAM 1 MG PO TABS: 1.0000 mg | ORAL_TABLET | Freq: Four times a day (QID) | ORAL | Status: DC | PRN

## 2013-06-20 MED ORDER — HYDROXYZINE HCL 10 MG PO TABS: 10.0000 mg | ORAL_TABLET | Freq: Three times a day (TID) | ORAL | Status: DC | PRN

## 2013-06-20 NOTE — Progress Notes (Signed)
  Subjective:    Patient ID: [name] [name], female    DOB: 11-17-1966, 47 y.o.   MRN: [id number]  HPI # Cyclical vomiting Asymptomatic currently  # Itchiness for 2 days Cause unclear: denies new deodorants, soaps, clothing Denies rash, fevers, nausea  Review of Systems Per HPI  Allergies, medication, past medical history reviewed.  Smoking status noted.      Objective:   Physical Exam GEN: NAD SKIN: no rash PULM: NI WOB ABD: soft, NT    Assessment & Plan:

## 2013-06-20 NOTE — Assessment & Plan Note (Addendum)
This is a patient I have been following for a few years now. She gets cyclical vomiting flares, etiology unknown. She does smoke marijuana but flares do not seem to be associated with the flares; she reports she started smoking because of the abdominal pain and vomiting flares that resulted in her diagnosis after a hysterectomy several years ago.  We have tried anti depressants and narcotics to help manage the pain.  Currently, she is not prescribed chronic narcotics due to concern from gastroenterologist that it was not controlling her symptoms well. When she did receive narcotics, she followed-up regularly and seemed to use them as prescribed.  When she gets flares, she comes to our clinic for IV phenergan or goes to the ED where she received IV anti-emetics, BDZ, and narcotics. It is appropriate to discharge her with a small quantity of narcotics (five) but we will not prescribe them chronically at our clinic and patient is aware of this.  -Rx for Ativan and Phenergan to take as needed -Avoid chronic narcotics although small quantities are okay for flares (five tablets at a time); I would avoid chronically because sometimes she takes them due to mild symptoms and as a result, ends up acting more like chronic medication  -Follow-up in 4 weeks or sooner if needed before medications run out

## 2013-06-20 NOTE — Assessment & Plan Note (Signed)
Cause unclear. No rash. Try hydroxyzine prn. If worsens or rash, then follow-up.

## 2013-06-20 NOTE — Progress Notes (Deleted)
  Subjective:    Patient ID: [name] [name], female    DOB: [birth year], 47 y.o.   MRN: [id number]  HPI    Review of Systems     Objective:   Physical Exam        Assessment & Plan:

## 2013-06-20 NOTE — Patient Instructions (Addendum)
Please follow-up and get an eye exam with Dr. Gershon Crane  Please follow-up with your new doctor in 1 month   Try the hydroxyzine as needed for itch

## 2013-06-25 ENCOUNTER — Encounter (HOSPITAL_COMMUNITY): Payer: Self-pay | Admitting: Emergency Medicine

## 2013-06-25 ENCOUNTER — Inpatient Hospital Stay (HOSPITAL_COMMUNITY)
Admission: EM | Admit: 2013-06-25 | Discharge: 2013-06-27 | DRG: 313 | Disposition: A | Discharge status: Home / Self Care | Payer: Medicare Other | Attending: Emergency Medicine | Admitting: Emergency Medicine

## 2013-06-25 ENCOUNTER — Emergency Department (HOSPITAL_COMMUNITY): Payer: Medicare Other

## 2013-06-25 DIAGNOSIS — E875 Hyperkalemia: Secondary | ICD-10-CM

## 2013-06-25 DIAGNOSIS — I129 Hypertensive chronic kidney disease with stage 1 through stage 4 chronic kidney disease, or unspecified chronic kidney disease: Secondary | ICD-10-CM | POA: Diagnosis present

## 2013-06-25 DIAGNOSIS — Z79899 Other long term (current) drug therapy: Secondary | ICD-10-CM | POA: Diagnosis not present

## 2013-06-25 DIAGNOSIS — R1115 Cyclical vomiting syndrome unrelated to migraine: Secondary | ICD-10-CM | POA: Diagnosis not present

## 2013-06-25 DIAGNOSIS — E1142 Type 2 diabetes mellitus with diabetic polyneuropathy: Secondary | ICD-10-CM | POA: Diagnosis not present

## 2013-06-25 DIAGNOSIS — R0789 Other chest pain: Secondary | ICD-10-CM | POA: Diagnosis not present

## 2013-06-25 DIAGNOSIS — IMO0002 Reserved for concepts with insufficient information to code with codable children: Secondary | ICD-10-CM | POA: Diagnosis present

## 2013-06-25 DIAGNOSIS — I209 Angina pectoris, unspecified: Secondary | ICD-10-CM | POA: Diagnosis not present

## 2013-06-25 DIAGNOSIS — F411 Generalized anxiety disorder: Secondary | ICD-10-CM | POA: Diagnosis present

## 2013-06-25 DIAGNOSIS — N183 Chronic kidney disease, stage 3 unspecified: Secondary | ICD-10-CM

## 2013-06-25 DIAGNOSIS — R079 Chest pain, unspecified: Secondary | ICD-10-CM

## 2013-06-25 DIAGNOSIS — E1329 Other specified diabetes mellitus with other diabetic kidney complication: Secondary | ICD-10-CM

## 2013-06-25 DIAGNOSIS — Z7982 Long term (current) use of aspirin: Secondary | ICD-10-CM | POA: Diagnosis not present

## 2013-06-25 DIAGNOSIS — R748 Abnormal levels of other serum enzymes: Secondary | ICD-10-CM | POA: Diagnosis present

## 2013-06-25 DIAGNOSIS — F17200 Nicotine dependence, unspecified, uncomplicated: Secondary | ICD-10-CM

## 2013-06-25 DIAGNOSIS — Z794 Long term (current) use of insulin: Secondary | ICD-10-CM

## 2013-06-25 DIAGNOSIS — I498 Other specified cardiac arrhythmias: Secondary | ICD-10-CM | POA: Diagnosis present

## 2013-06-25 DIAGNOSIS — E1322 Other specified diabetes mellitus with diabetic chronic kidney disease: Secondary | ICD-10-CM | POA: Diagnosis present

## 2013-06-25 DIAGNOSIS — I252 Old myocardial infarction: Secondary | ICD-10-CM | POA: Diagnosis not present

## 2013-06-25 DIAGNOSIS — K3184 Gastroparesis: Secondary | ICD-10-CM | POA: Diagnosis present

## 2013-06-25 DIAGNOSIS — R072 Precordial pain: Secondary | ICD-10-CM | POA: Diagnosis not present

## 2013-06-25 DIAGNOSIS — K219 Gastro-esophageal reflux disease without esophagitis: Secondary | ICD-10-CM | POA: Diagnosis present

## 2013-06-25 DIAGNOSIS — E1149 Type 2 diabetes mellitus with other diabetic neurological complication: Secondary | ICD-10-CM | POA: Diagnosis present

## 2013-06-25 DIAGNOSIS — I10 Essential (primary) hypertension: Secondary | ICD-10-CM | POA: Diagnosis not present

## 2013-06-25 DIAGNOSIS — E1129 Type 2 diabetes mellitus with other diabetic kidney complication: Secondary | ICD-10-CM | POA: Diagnosis present

## 2013-06-25 DIAGNOSIS — R1013 Epigastric pain: Secondary | ICD-10-CM

## 2013-06-25 LAB — LIPASE, BLOOD: Lipase: 136 U/L — ABNORMAL HIGH (ref 11–59)

## 2013-06-25 LAB — BASIC METABOLIC PANEL
CO2: 25 mEq/L (ref 19–32)
Chloride: 103 mEq/L (ref 96–112)
GFR calc Af Amer: 33 mL/min — ABNORMAL LOW (ref >90)
Potassium: 5.8 mEq/L — ABNORMAL HIGH (ref 3.5–5.1)

## 2013-06-25 LAB — TROPONIN I: Troponin I: <0.3 ng/mL (ref <0.30)

## 2013-06-25 LAB — CBC
Platelets: 235 10*3/uL (ref 150–400)
RBC: 3.53 MIL/uL — ABNORMAL LOW (ref 3.87–5.11)
RDW: 13.8 % (ref 11.5–15.5)
WBC: 4.7 10*3/uL (ref 4.0–10.5)

## 2013-06-25 LAB — HEPATIC FUNCTION PANEL
ALT: 9 U/L (ref 0–35)
AST: 13 U/L (ref 0–37)
Alkaline Phosphatase: 83 U/L (ref 39–117)
Bilirubin, Direct: <0.1 mg/dL (ref 0.0–0.3)
Total Bilirubin: 0.1 mg/dL — ABNORMAL LOW (ref 0.3–1.2)

## 2013-06-25 LAB — POCT I-STAT TROPONIN I: Troponin i, poc: 0.01 ng/mL (ref 0.00–0.08)

## 2013-06-25 LAB — RAPID URINE DRUG SCREEN, HOSP PERFORMED
Benzodiazepines: NOT DETECTED
Cocaine: NOT DETECTED
Opiates: NOT DETECTED

## 2013-06-25 LAB — POCT PREGNANCY, URINE: Preg Test, Ur: NEGATIVE

## 2013-06-25 MED ORDER — SODIUM CHLORIDE 0.9 % IJ SOLN
10.0000 mL | Freq: Two times a day (BID) | INTRAMUSCULAR | Status: DC
  Administered 2013-06-25 – 2013-06-26 (×4): 10 mL

## 2013-06-25 MED ORDER — NITROGLYCERIN 0.4 MG SL SUBL
0.4000 mg | SUBLINGUAL_TABLET | SUBLINGUAL | Status: DC | PRN
  Administered 2013-06-25 (×2): 0.4 mg via SUBLINGUAL
  Filled 2013-06-25: qty 25

## 2013-06-25 MED ORDER — ASPIRIN EC 81 MG PO TBEC
81.0000 mg | DELAYED_RELEASE_TABLET | Freq: Every morning | ORAL | Status: DC
  Administered 2013-06-26 – 2013-06-27 (×2): 81 mg via ORAL
  Filled 2013-06-25 (×2): qty 1

## 2013-06-25 MED ORDER — PANTOPRAZOLE SODIUM 40 MG IV SOLR
40.0000 mg | INTRAVENOUS | Status: DC
  Administered 2013-06-26 (×2): 40 mg via INTRAVENOUS
  Filled 2013-06-25 (×3): qty 40

## 2013-06-25 MED ORDER — NITROGLYCERIN IN D5W 200-5 MCG/ML-% IV SOLN
3.0000 ug/min | INTRAVENOUS | Status: DC
  Administered 2013-06-25: 20 ug/min via INTRAVENOUS
  Administered 2013-06-25: 5 ug/min via INTRAVENOUS
  Administered 2013-06-25: 30 ug/min via INTRAVENOUS
  Administered 2013-06-25: 15 ug/min via INTRAVENOUS
  Filled 2013-06-25: qty 250

## 2013-06-25 MED ORDER — PROMETHAZINE HCL 25 MG/ML IJ SOLN
12.5000 mg | Freq: Once | INTRAMUSCULAR | Status: AC
  Administered 2013-06-25: 12.5 mg via INTRAVENOUS
  Filled 2013-06-25: qty 1

## 2013-06-25 MED ORDER — SODIUM POLYSTYRENE SULFONATE 15 GM/60ML PO SUSP
15.0000 g | Freq: Once | ORAL | Status: AC
  Administered 2013-06-25: 15 g via ORAL
  Filled 2013-06-25: qty 60

## 2013-06-25 MED ORDER — SODIUM CHLORIDE 0.9 % IJ SOLN: 10.0000 mL | INTRAMUSCULAR | Status: DC | PRN

## 2013-06-25 MED ORDER — MORPHINE SULFATE 2 MG/ML IJ SOLN
2.0000 mg | INTRAMUSCULAR | Status: DC | PRN
  Administered 2013-06-25 (×2): 2 mg via INTRAVENOUS
  Filled 2013-06-25 (×2): qty 1

## 2013-06-25 MED ORDER — MORPHINE SULFATE 4 MG/ML IJ SOLN
4.0000 mg | INTRAMUSCULAR | Status: DC | PRN
  Administered 2013-06-25: 4 mg via INTRAVENOUS
  Filled 2013-06-25: qty 1

## 2013-06-25 MED ORDER — INSULIN GLARGINE 100 UNIT/ML ~~LOC~~ SOLN
20.0000 [IU] | Freq: Every morning | SUBCUTANEOUS | Status: DC
  Administered 2013-06-26 – 2013-06-27 (×2): 20 [IU] via SUBCUTANEOUS
  Filled 2013-06-25 (×2): qty 0.2

## 2013-06-25 MED ORDER — ONDANSETRON 4 MG PO TBDP
4.0000 mg | ORAL_TABLET | Freq: Once | ORAL | Status: DC
  Filled 2013-06-25: qty 1

## 2013-06-25 MED ORDER — CARVEDILOL 25 MG PO TABS
25.0000 mg | ORAL_TABLET | Freq: Two times a day (BID) | ORAL | Status: DC
  Administered 2013-06-26 – 2013-06-27 (×3): 25 mg via ORAL
  Filled 2013-06-25 (×6): qty 1

## 2013-06-25 MED ORDER — HEPARIN BOLUS VIA INFUSION
3000.0000 [IU] | Freq: Once | INTRAVENOUS | Status: AC
  Administered 2013-06-25: 3000 [IU] via INTRAVENOUS
  Filled 2013-06-25: qty 3000

## 2013-06-25 MED ORDER — SODIUM CHLORIDE 0.9 % IJ SOLN: 3.0000 mL | INTRAMUSCULAR | Status: DC | PRN

## 2013-06-25 MED ORDER — ONDANSETRON HCL 4 MG/2ML IJ SOLN: 4.0000 mg | Freq: Once | INTRAMUSCULAR | Status: DC

## 2013-06-25 MED ORDER — LORAZEPAM 1 MG PO TABS
1.0000 mg | ORAL_TABLET | Freq: Four times a day (QID) | ORAL | Status: DC | PRN
  Administered 2013-06-25: 1 mg via ORAL
  Filled 2013-06-25: qty 1

## 2013-06-25 MED ORDER — PROMETHAZINE HCL 25 MG/ML IJ SOLN
12.5000 mg | Freq: Four times a day (QID) | INTRAMUSCULAR | Status: DC | PRN
  Administered 2013-06-25 – 2013-06-26 (×5): 12.5 mg via INTRAVENOUS
  Filled 2013-06-25 (×6): qty 1

## 2013-06-25 MED ORDER — HEPARIN (PORCINE) IN NACL 100-0.45 UNIT/ML-% IJ SOLN
900.0000 [IU]/h | INTRAMUSCULAR | Status: DC
  Administered 2013-06-25: 900 [IU]/h via INTRAVENOUS
  Filled 2013-06-25: qty 250

## 2013-06-25 MED ORDER — LISINOPRIL 40 MG PO TABS
40.0000 mg | ORAL_TABLET | Freq: Every morning | ORAL | Status: DC
  Administered 2013-06-26 – 2013-06-27 (×2): 40 mg via ORAL
  Filled 2013-06-25 (×2): qty 1

## 2013-06-25 MED ORDER — SODIUM CHLORIDE 0.9 % IJ SOLN
3.0000 mL | Freq: Two times a day (BID) | INTRAMUSCULAR | Status: DC
  Administered 2013-06-26: 3 mL via INTRAVENOUS

## 2013-06-25 MED ORDER — SODIUM CHLORIDE 0.9 % IV SOLN: 250.0000 mL | INTRAVENOUS | Status: DC | PRN

## 2013-06-25 MED ORDER — ASPIRIN 81 MG PO CHEW
324.0000 mg | CHEWABLE_TABLET | Freq: Once | ORAL | Status: AC
  Administered 2013-06-25: 324 mg via ORAL
  Filled 2013-06-25: qty 4

## 2013-06-25 NOTE — Consult Note (Signed)
Reason for Consult: CP Referring Physician:   [name] is an 47 y.o. female.  HPI:   The patient is a 47 yo female with a history of mild NSTEMI in 08/18/2007 but with normal coronary arteries by cath, the last 11 years and for which she has a Port-A-Cath for medication administration, HTN, pancreatitis, PNA, GERD, Tobacco abuse, Marijuana use, DM2, anemia.  She presents with CP which began at 10:15 this morning. She states, "It felt like a fist in my chest." Was 10 out of 10 in intensity. She reported nausea vomiting and diaphoresis. She vomited and then felt better. She had a second episode at 11 AM but with no diaphoresis or vomiting. She reports some abdominal pain. She also had some leg cramping this past Saturday and seeing spots yesterday. Currently her pain is 6-7/10 in intensity. The patient denies, fever, shortness of breath, orthopnea, dizziness, PND, cough, congestion, abdominal pain, hematochezia, melena, lower extremity edema, claudication.   Past Medical History  Diagnosis Date  . PANCREATITIS 05/09/2008  . Cyclic vomiting syndrome   . Hypertension   . Complication of anesthesia     "problems waking up"  . Heart murmur   . Myocardial infarction 07/2007    "they say I've had a silent one; I don't know"  . Pneumonia   . Anemia   . Blood transfusion   . GERD (gastroesophageal reflux disease)   . Anxiety   . Depression   . Smoker   . Marijuana smoker, continuous   . NEUROPATHY, DIABETIC 02/23/2007  . Type II diabetes mellitus   . Diabetic gastroparesis     /e-chart    Past Surgical History  Procedure Laterality Date  . Port-a-cath placement  ~ 2008    right chest; "poor access; frequent hospitalizations"  . Laparotomy and lysis of adhesions    . Abdominal hysterectomy  02/2002  . Cholecystectomy  1980's  . Cardiac catheterization    . Dilation and evacuation  X3  . Left hand surgery    . Cataract extraction w/ intraocular lens  implant, bilateral    .  Colonoscopy    . Upper gi endoscopy      Family History  Problem Relation Age of Onset  . Diabetes type II Mother   . Diabetes Mother   . Hypertension Mother   . Diabetes type II Sister   . Diabetes Sister   . Hypertension Sister   . Hypertension Brother        Mother died from a myocardial infarction at age 64  Social History:  reports that she has been smoking Cigarettes.  She has a 11 pack-year smoking history. She has never used smokeless tobacco. She reports that she uses illicit drugs (Marijuana). She reports that she does not drink alcohol.  Allergies:  Allergies  Allergen Reactions  . Bee Venom Anaphylaxis  . Acetaminophen Nausea And Vomiting  . Novolog (Insulin Aspart) Other (See Comments)    Muscles in feet cramp  . Erythromycin Nausea And Vomiting    Medications:  Prior to Admission medications   Medication Sig Start Date End Date Taking? Authorizing Provider  aspirin EC 81 MG tablet Take 81 mg by mouth every morning.   Yes Historical Provider, MD  carvedilol (COREG) 25 MG tablet Take 25 mg by mouth 2 (two) times daily with a meal.   Yes Historical Provider, MD  diclofenac sodium (VOLTAREN) 1 % GEL Apply 2 g topically 4 (four) times daily as needed (pain).  Yes Historical Provider, MD  EPINEPHrine (EPIPEN) 0.3 mg/0.3 mL DEVI Inject 0.3 mg into the muscle once as needed (for allegic reactions).   Yes Historical Provider, MD  EPINEPHrine (EPIPEN) 0.3 mg/0.3 mL SOAJ Inject 0.3 mg into the muscle daily as needed (Anaphylaxis).   Yes Historical Provider, MD  insulin glargine (LANTUS) 100 UNIT/ML injection Inject 20 Units into the skin every morning.  04/02/13  Yes Carolin Guernsey, MD  lidocaine (LIDODERM) 5 % Place 1 patch onto the skin daily as needed (pain). Remove & Discard patch within 12 hours or as directed by MD   Yes Historical Provider, MD  lisinopril (PRINIVIL,ZESTRIL) 40 MG tablet Take 40 mg by mouth every morning.   Yes Historical Provider, MD  LORazepam  (ATIVAN) 1 MG tablet Take 1 tablet (1 mg total) by mouth every 6 (six) hours as needed (nausea). 06/20/13  Yes Carolin Guernsey, MD  promethazine (PHENERGAN) 25 MG tablet Take 1 tablet (25 mg total) by mouth every 8 (eight) hours as needed for nausea. 06/20/13  Yes Carolin Guernsey, MD  SUMAtriptan (IMITREX) 50 MG tablet Take 50 mg by mouth every 2 (two) hours as needed for migraine.  06/08/13  Yes Historical Provider, MD     Results for orders placed during the hospital encounter of 06/25/13 (from the past 48 hour(s))  CBC     Status: Abnormal   Collection Time    06/25/13 12:07 PM      Result Value Range   WBC 4.7  4.0 - 10.5 K/uL   RBC 3.53 (*) 3.87 - 5.11 MIL/uL   Hemoglobin 11.0 (*) 12.0 - 15.0 g/dL   HCT 32.0 (*) 36.0 - 46.0 %   MCV 90.7  78.0 - 100.0 fL   MCH 31.2  26.0 - 34.0 pg   MCHC 34.4  30.0 - 36.0 g/dL   RDW 13.8  11.5 - 15.5 %   Platelets 235  150 - 400 K/uL  BASIC METABOLIC PANEL     Status: Abnormal   Collection Time    06/25/13 12:07 PM      Result Value Range   Sodium 136  135 - 145 mEq/L   Potassium 5.8 (*) 3.5 - 5.1 mEq/L   Chloride 103  96 - 112 mEq/L   CO2 25  19 - 32 mEq/L   Glucose, Bld 208 (*) 70 - 99 mg/dL   BUN 32 (*) 6 - 23 mg/dL   Creatinine, Ser 1.97 (*) 0.50 - 1.10 mg/dL   Calcium 9.3  8.4 - 10.5 mg/dL   GFR calc non Af Amer 29 (*) >90 mL/min   GFR calc Af Amer 33 (*) >90 mL/min   Comment:            The eGFR has been calculated     using the CKD EPI equation.     This calculation has not been     validated in all clinical     situations.     eGFR's persistently     <90 mL/min signify     possible Chronic Kidney Disease.  POCT I-STAT TROPONIN I     Status: None   Collection Time    06/25/13 12:30 PM      Result Value Range   Troponin i, poc 0.01  0.00 - 0.08 ng/mL   Comment 3            Comment: Due to the release kinetics of cTnI,  a negative result within the first hours     of the onset of symptoms does not rule out     myocardial  infarction with certainty.     If myocardial infarction is still suspected,     repeat the test at appropriate intervals.    Dg Chest 2 View  06/25/2013   *RADIOLOGY REPORT*  Clinical Data: 47 year old female chest pain.  Hypertension.  CHEST - 2 VIEW  Comparison: 04/17/2013 and earlier.  Findings: Stable and normal lung volumes.  Right chest Port-A-Cath re-identified.  The port is no longer accessed.  Cardiac size and mediastinal contours are within normal limits.  Visualized tracheal air column is within normal limits.  No pneumothorax, pulmonary edema, pleural effusion or confluent pulmonary opacity.  Right upper quadrant surgical clips. No acute osseous abnormality identified.  IMPRESSION: No acute cardiopulmonary abnormality.   Original Report Authenticated By: Roselyn Reef, M.D.    Review of Systems  Constitutional: Positive for diaphoresis. Negative for fever.  HENT: Negative for congestion and sore throat.   Eyes:       Was seeing spots yesterday.  Respiratory: Negative for cough and shortness of breath.   Cardiovascular: Positive for chest pain. Negative for orthopnea, leg swelling and PND.  Gastrointestinal: Positive for nausea, vomiting and abdominal pain. Negative for blood in stool and melena.  Genitourinary: Negative for dysuria.  Musculoskeletal: Negative for myalgias.  Neurological: Negative for dizziness.  All other systems reviewed and are negative.   Blood pressure 186/77, pulse 50, temperature 98.2 F (36.8 C), temperature source Oral, resp. rate 15, SpO2 100.00%. Physical Exam  Constitutional: She is oriented to person, place, and time. She appears well-developed and well-nourished. No distress.  HENT:  Head: Normocephalic and atraumatic.  Mouth/Throat: Oropharynx is clear and moist. No oropharyngeal exudate.  Eyes: EOM are normal. Pupils are equal, round, and reactive to light. No scleral icterus.  Neck: Normal range of motion. Neck supple.  Cardiovascular: S1  normal and S2 normal.  Bradycardia present.   No murmur heard. Pulses:      Radial pulses are 2+ on the right side, and 2+ on the left side.       Dorsalis pedis pulses are 2+ on the right side, and 2+ on the left side.       Posterior tibial pulses are 2+ on the right side, and 2+ on the left side.  No carotid bruits  Respiratory: Effort normal and breath sounds normal. No respiratory distress. She has no rales.  GI: Soft. Bowel sounds are normal. She exhibits no distension. There is tenderness (severely tender in the epigastric region).  Musculoskeletal: She exhibits no edema.  No lower extremity edema  Lymphadenopathy:    She has no cervical adenopathy.  Neurological: She is alert and oriented to person, place, and time. She exhibits normal muscle tone.  Skin: Skin is warm and dry.  Psychiatric: She has a normal mood and affect.    Assessment/Plan: Active Problems:   HYPERTENSION, BENIGN SYSTEMIC   Uncontrolled secondary diabetes mellitus with stage 3 CKD (GFR 30-59)   Chest pain   Hyperkalemia  Plan:  Patient left heart catheterization 2008 with normal coronary arteries.  Severely tender in the epigastric region with elevated lipase.  No acute EKG changes.  Troponin POC negative.  We will cycle troponin. Recommend IV PPI.  Risk factors: HTN, DM, tobacco.  Check lipids.  If enzymes negative, NST when ready.   Monitor K+.  If it goes any higher,  give kayexalate.   Recommend ABD [place] for CBD stone.    Tarri Fuller 06/25/2013, 4:49 PM      Agree with note written by Luisa Dago [facility]  +CRF, - cath 6 years ago. Other probs as outlined. Symptoms do not sound cardiac. No objective evidence for ACS. Exam particularly notable for epigastric tenderness and lipase mildly elevated. Would cycle enz, Rx with PPI and image abd to R/O pancreatitis, retained CBD stone etc... GI eval. Will follow with you.  Lorretta Harp 06/25/2013 9:32 PM

## 2013-06-25 NOTE — H&P (Signed)
[facility] Admission History and Physical Service Pager: [phone]  Patient name: [name] [name] Medical record number: [id number] Date of birth: Nov 07, 1966 Age: 47 y.o. Gender: female  Primary Care Provider: [facility] [place], [name], MD Consultants: Cardiology ([place] - previous cath by Dr. Claiborne Billings) Code Status: full code (per discussion with patient upon admission)  Chief Complaint: chest pain  Assessment and Plan: [name] [name] is a 47 y.o. year old female presenting with chest pain . PMH is significant for prior NSTEMI with heart cath several years ago, but no recent cardiac workup. As patient has significant cardiac history with chest pain at rest associated with vomiting and diaphoresis, must be concerned for ACS. EKG appears nonischemic (just mildly peaked T-waves)  # Chest pain: unstable angina vs GI cause - admit to stepdown unit for heparin drip & nitro drip - aspirin 81mg  daily - continue beta blocker [ ]  risk stratification labs: lipid panel, TSH  (had recent A1c) [ ]  consult cardiology for possible stress test vs. cath [ ]  cycle troponins x 3 [ ]  repeat EKG in AM [ ]  will also check lipase and LFT's as pt has epigastric tenderness  # Diabetes: takes Lantus 22 units QAM at home - continue home Lantus - cannot do sliding scale as pt reports intolerance to novolog (cramping in her feet) - A1c 8.4 on 05/16/2013 [ ]  check CBG's QAC/HS  # CKD: currently at baseline (~2) [ ]  monitor daily BMET's  # Hypertension: - continue home lisinopril, carvedilol  # Cyclic vomiting syndrome: - has historically been triggered by marijuana use but pt says she has not used in 7 days - continue home ativan prn, phenergan prn nausea  # Hyperkalemia: K 5.8 on BMET, EKG showing mildly peaked T waves relative to baseline voltage on EKG - will give one dose of kayexalate now [ ]  repeat BMET this PM  # Vision changes/seeing "light spots":  endorsed on review of systems, occurred yesterday but is not occuring now - continue to monitor, consider ophthalmology consult if returns  FEN/GI: SLIV, diabetic diet Prophylaxis: on full dose heparin for active chest pain/possible ACS   Disposition: pending cardiac rule out and cardiology recommendations  History of Present Illness: [name] [name] is a 47 y.o. year old female presenting with chest pain.  Was sitting on couch watching TV and noticed the pain this morning. Around 10:05 noticed the pain, like a tightness in her chest. 20 minutes later started to feel nauseated and vomited 3 times. Felt slightly better after vomiting but by 11am started to have the pain again. Felt sweaty. The pain did not go away. Endorses a pressure-like discomfort now. Had no pain prior to today. Felt well this morning before the chest pain. Has not had any chest pain like this in about one year (at which time she was evaluated and discharge from the ED). Was not able to ambulate so is unsure if pain was worse with ambulation. Now feels like there is a fist on her chest. Feels short of breath if talks a lot now, but did feel short of breath earlier while sitting at rest and not talking. Has hx of NSTEMI 4-5 years ago and has not seen a cardiologist since that time. Had cath done at that time but does not think she had a stent placed. Did not take any medicine for the pain today. Can't figure out anything that triggered the pain. The pain was located under  left breast and moved toward center of chest, then to the right. The second time the pain came back was full sensation in the center. Normally can ambulate without chest pain, shortness of breath. Last time she vomited before today was about 2 weeks ago.  Yesterday was seeing light spots. Had a slight headache at that time. Is not having light spots now. Does have some abdominal pain in the top. Denies diarrhea, rash.  Review Of Systems: Per HPI, otherwise review  of systems was performed and was unremarkable.  Patient Active Problem List   Diagnosis Date Noted  . Pruritus 06/20/2013  . Abdominal pain, epigastric 04/19/2013  . Esophagitis 04/18/2013  . Preventative health care 09/18/2012  . High risk social situation 08/31/2012  . Allergic rhinitis, seasonal 05/04/2012  . Marijuana smoker 11/03/2011  . Uncontrolled secondary diabetes mellitus with stage 3 CKD (GFR 30-59) 10/23/2011  . Cyclic vomiting syndrome 08/03/2011  . Hot flashes 07/29/2011  . BACK PAIN, CHRONIC 04/10/2007  . DEPRESSION, MAJOR, RECURRENT 02/23/2007  . TOBACCO DEPENDENCE 02/23/2007  . MIGRAINE, UNSPEC., W/O INTRACTABLE MIGRAINE 02/23/2007  . HYPERTENSION, BENIGN SYSTEMIC 02/23/2007  . INSOMNIA NOS 02/23/2007  . NEUROPATHY, DIABETIC 02/23/2007  . GERD 01/27/2004   Past Medical History: Past Medical History  Diagnosis Date  . PANCREATITIS 05/09/2008  . Cyclic vomiting syndrome   . Hypertension   . Complication of anesthesia     "problems waking up"  . Heart murmur   . Myocardial infarction 07/2007    "they say I've had a silent one; I don't know"  . Pneumonia   . Anemia   . Blood transfusion   . GERD (gastroesophageal reflux disease)   . Anxiety   . Depression   . Smoker   . Marijuana smoker, continuous   . NEUROPATHY, DIABETIC 02/23/2007  . Type II diabetes mellitus   . Diabetic gastroparesis     /e-chart   Past Surgical History: Past Surgical History  Procedure Laterality Date  . Port-a-cath placement  ~ 2008    right chest; "poor access; frequent hospitalizations"  . Laparotomy and lysis of adhesions    . Abdominal hysterectomy  02/2002  . Cholecystectomy  1980's  . Cardiac catheterization    . Dilation and evacuation  X3  . Left hand surgery    . Cataract extraction w/ intraocular lens  implant, bilateral    . Colonoscopy    . Upper gi endoscopy     Social History: History  Substance Use Topics  . Smoking status: Current Some Day Smoker --  0.50 packs/day for 22 years    Types: Cigarettes    Last Attempt to Quit: 02/22/2012  . Smokeless tobacco: Never Used  . Alcohol Use: No   Additional social history: trying to quit smoking cigarettes - last cigarette was 3 days. Has not smoked marijuana in about 7 days. Denies cocaine use.  Please also refer to relevant sections of EMR.  Family History: Family History  Problem Relation Age of Onset  . Diabetes type II Mother   . Diabetes Mother   . Hypertension Mother   . Diabetes type II Sister   . Diabetes Sister   . Hypertension Sister   . Hypertension Brother    Allergies and Medications: Allergies  Allergen Reactions  . Bee Venom Anaphylaxis  . Acetaminophen Nausea And Vomiting  . Novolog (Insulin Aspart) Other (See Comments)    Muscles in feet cramp  . Erythromycin Nausea And Vomiting  Uses lantus succesfully  Current  Facility-Administered Medications on File Prior to Encounter  Medication Dose Route Frequency Provider Last Rate Last Dose  . 0.9 %  sodium chloride infusion   Intravenous Continuous Andrena Mews, MD 20 mL/hr at 04/19/13 1500 500 mL at 04/19/13 1500   Current Outpatient Prescriptions on File Prior to Encounter  Medication Sig Dispense Refill  . aspirin EC 81 MG tablet Take 81 mg by mouth every morning.      . carvedilol (COREG) 25 MG tablet Take 25 mg by mouth 2 (two) times daily with a meal.      . diclofenac sodium (VOLTAREN) 1 % GEL Apply 2 g topically 4 (four) times daily as needed (pain).      Marland Kitchen EPINEPHrine (EPIPEN) 0.3 mg/0.3 mL DEVI Inject 0.3 mg into the muscle once as needed (for allegic reactions).      . insulin glargine (LANTUS) 100 UNIT/ML injection Inject 20 Units into the skin every morning.       . lidocaine (LIDODERM) 5 % Place 1 patch onto the skin daily as needed (pain). Remove & Discard patch within 12 hours or as directed by MD      . lisinopril (PRINIVIL,ZESTRIL) 40 MG tablet Take 40 mg by mouth every morning.      Marland Kitchen LORazepam  (ATIVAN) 1 MG tablet Take 1 tablet (1 mg total) by mouth every 6 (six) hours as needed (nausea).  30 tablet  0  . promethazine (PHENERGAN) 25 MG tablet Take 1 tablet (25 mg total) by mouth every 8 (eight) hours as needed for nausea.  30 tablet  3  . SUMAtriptan (IMITREX) 50 MG tablet Take 50 mg by mouth every 2 (two) hours as needed for migraine.       22 units of Lantus every morning.  Objective: BP 138/70  Pulse 56  Temp(Src) 98.2 F (36.8 C) (Oral)  Resp 12  SpO2 100% Exam: General: NAD, lying in bed HEENT: NCAT. Pupils equal bilaterally but not reactive to light (pt has had lens surgery in past) Cardiovascular: mildly bradycardic, regular rhythm, no murmurs auscultated Respiratory: CTAB, NWOB, speaks in clear sentences without being SOB Abdomen: soft. BS quiet. Tender to palpation in epigastric area. Extremities: no appreciable lower extremity edema bilaterally Skin: no rashes noted Neuro: face symmetric, sensation in tact to light touch over bilateral face, grip 5/5 bilaterally, hip flexion 5/5 bilaterally, speech intact  Labs and Imaging: CBC BMET   Recent Labs Lab 06/25/13 1207  WBC 4.7  HGB 11.0*  HCT 32.0*  PLT 235    Recent Labs Lab 06/25/13 1207  NA 136  K 5.8*  CL 103  CO2 25  BUN 32*  CREATININE 1.97*  GLUCOSE 208*  CALCIUM 9.3     iStat troponin 0.01 CXR no acute  EKG: sinus bradycardia, normal intervals, no ST changes, mildly peaked t-waves, no t-wave inversions  Leeanne Rio, MD 06/25/2013, 3:27 PM PGY-1, [facility] pager: [phone]  PGY-2 Addendum I have seen and examined this patient.  I have reviewed and agree with the above note.  BOOTH, [place] 06/25/2013, 4:47 PM

## 2013-06-25 NOTE — Progress Notes (Signed)
ANTICOAGULATION CONSULT NOTE - Initial Consult  Pharmacy Consult for heparin Indication: chest pain/ACS  Allergies  Allergen Reactions  . Bee Venom Anaphylaxis  . Acetaminophen Nausea And Vomiting  . Novolog (Insulin Aspart) Other (See Comments)    Muscles in feet cramp  . Erythromycin Nausea And Vomiting    Patient Measurements: Height: 5' 1.42" (156 cm) Weight: 160 lb 0.9 oz (72.6 kg) IBW/kg (Calculated) : 48.76 Heparin Dosing Weight: 64.4kg  Vital Signs: Temp: 98.8 F (37.1 C) (06/30 1930) Temp src: Oral (06/30 1930) BP: 151/71 mmHg (06/30 2000) Pulse Rate: 64 (06/30 2000)  Labs:  Recent Labs  06/25/13 1207  HGB 11.0*  HCT 32.0*  PLT 235  CREATININE 1.97*    Estimated Creatinine Clearance: 32.1 ml/min (by C-G formula based on Cr of 1.97).   Medical History: Past Medical History  Diagnosis Date  . PANCREATITIS 05/09/2008  . Cyclic vomiting syndrome   . Hypertension   . Complication of anesthesia     "problems waking up"  . Heart murmur   . Myocardial infarction 07/2007    "they say I've had a silent one; I don't know"  . Pneumonia   . Anemia   . Blood transfusion   . GERD (gastroesophageal reflux disease)   . Anxiety   . Depression   . Smoker   . Marijuana smoker, continuous   . NEUROPATHY, DIABETIC 02/23/2007  . Type II diabetes mellitus   . Diabetic gastroparesis     /e-chart    Medications:  Scheduled:  . [START ON 06/26/2013] aspirin EC  81 mg Oral q morning - 10a  . [START ON 06/26/2013] carvedilol  25 mg Oral BID WC  . heparin  3,000 Units Intravenous Once  . [START ON 06/26/2013] insulin glargine  20 Units Subcutaneous q morning - 10a  . [START ON 06/26/2013] lisinopril  40 mg Oral q morning - 10a  . sodium chloride  10-40 mL Intracatheter Q12H  . sodium chloride  3 mL Intravenous Q12H    Assessment: 47 yr old female with a history of a mild STEMI in 2008. Pt now presents with chest pain, N/V, and diaphoresis. PMH includes PNA, GERD, Tobacco  and Marijuana use, DM2, pancreatitis and anemia.  She has a Port-A-Cath for medication administration.  Goal of Therapy:  Heparin level 0.3-0.7 units/ml Monitor platelets by anticoagulation protocol: Yes   Plan:  Will give heparin bolus of 3000 units and a heparin drip at 900 units/hr. Will get daily heparin levels and CBC while she is on  Heparin.  Minta Balsam 06/25/2013,8:35 PM

## 2013-06-25 NOTE — ED Notes (Signed)
Pt c/o mid sternal with nausea and diaphoresis starting today

## 2013-06-25 NOTE — Progress Notes (Signed)
Bp cuff cycled q1h per md request by patient.

## 2013-06-25 NOTE — Progress Notes (Addendum)
[facility] PCP Note  I will be patient's new PCP and visited patient today in the hospital. She has a h/o NSTEMI and heart cath but has not had recent workup. She was admitted today with chest pain, and is undergoing chest pain rule-out with cycled troponins and AM repeat EKG. She will be followed by cardiology for consideration of further workup. She also has elevated lipase, which she has had in the past, so will consider treating conservatively for pancreatitis. She is currently NPO. Team is also managing her Diabetes, CKD, HTN, and cyclic vomiting syndrome, which seems to be possibly triggered currently.   Ms. Smithee is also requesting increased pain medication, asking nurse for dilaudid. She also wants something more for nausea and to have BP cuff removed for comfort. Asked nurse to space BP cuff to q1hour instead of q15 minutes, and to increase back to more frequent checks if q1hour BPs were elevated. Will increase to morphine 4mg  q2hr prn but I hesitate to escalate pain medication further. FPTS Team made aware.  I appreciate the Family Practice Teaching Service care of my primary patient while she is in the hospital.   Conni Slipper  06/25/2013  10:12 PM

## 2013-06-25 NOTE — ED Provider Notes (Signed)
History    CSN: [id number] Arrival date & time 06/25/13  1131  First MD Initiated Contact with Patient 06/25/13 1404     Chief Complaint  Patient presents with  . Chest Pain   (Consider location/radiation/quality/duration/timing/severity/associated sxs/prior Treatment) HPI Comments: Pt comes in with cc of chest pain. Has hx of HTN, IDDM, smoking - ? NSTEMI with cath 6 years ago - but no intervention and no recent cardiac stress tests. States that around 9:45 am, she had unprovoked chest pain, mid-sternal, left sided, with associated nausea and diophoresis. No recent infections, no cough, no hx of PE, DVT.   Patient is a 47 y.o. female presenting with chest pain. The history is provided by the patient and medical records.  Chest Pain Associated symptoms: diaphoresis and nausea   Associated symptoms: no abdominal pain, no cough, no fever, no shortness of breath and not vomiting    Past Medical History  Diagnosis Date  . PANCREATITIS 05/09/2008  . Cyclic vomiting syndrome   . Hypertension   . Complication of anesthesia     "problems waking up"  . Heart murmur   . Myocardial infarction 07/2007    "they say I've had a silent one; I don't know"  . Pneumonia   . Anemia   . Blood transfusion   . GERD (gastroesophageal reflux disease)   . Anxiety   . Depression   . Smoker   . Marijuana smoker, continuous   . NEUROPATHY, DIABETIC 02/23/2007  . Type II diabetes mellitus   . Diabetic gastroparesis     /e-chart   Past Surgical History  Procedure Laterality Date  . Port-a-cath placement  ~ 2008    right chest; "poor access; frequent hospitalizations"  . Laparotomy and lysis of adhesions    . Abdominal hysterectomy  02/2002  . Cholecystectomy  1980's  . Cardiac catheterization    . Dilation and evacuation  X3  . Left hand surgery    . Cataract extraction w/ intraocular lens  implant, bilateral    . Colonoscopy    . Upper gi endoscopy     Family History  Problem Relation Age  of Onset  . Diabetes type II Mother   . Diabetes Mother   . Hypertension Mother   . Diabetes type II Sister   . Diabetes Sister   . Hypertension Sister   . Hypertension Brother    History  Substance Use Topics  . Smoking status: Current Some Day Smoker -- 0.50 packs/day for 22 years    Types: Cigarettes    Last Attempt to Quit: 02/22/2012  . Smokeless tobacco: Never Used  . Alcohol Use: No   OB History   Grav Para Term Preterm Abortions TAB SAB Ect Mult Living                 Review of Systems  Constitutional: Positive for diaphoresis. Negative for fever and activity change.  HENT: Negative for facial swelling and neck pain.   Respiratory: Negative for cough, shortness of breath and wheezing.   Cardiovascular: Positive for chest pain.  Gastrointestinal: Positive for nausea. Negative for vomiting, abdominal pain, diarrhea, constipation, blood in stool and abdominal distention.  Genitourinary: Negative for hematuria and difficulty urinating.  Skin: Negative for color change.  Neurological: Negative for speech difficulty.  Hematological: Does not bruise/bleed easily.  Psychiatric/Behavioral: Negative for confusion.    Allergies  Bee venom; Acetaminophen; Novolog; and Erythromycin  Home Medications   Current Outpatient Rx  Name  Route  Sig  Dispense  Refill  . aspirin EC 81 MG tablet   Oral   Take 81 mg by mouth every morning.         . carvedilol (COREG) 25 MG tablet   Oral   Take 25 mg by mouth 2 (two) times daily with a meal.         . diclofenac sodium (VOLTAREN) 1 % GEL   Topical   Apply 2 g topically 4 (four) times daily as needed (pain).         Marland Kitchen EPINEPHrine (EPIPEN) 0.3 mg/0.3 mL DEVI   Intramuscular   Inject 0.3 mg into the muscle once as needed (for allegic reactions).         . EPINEPHrine (EPIPEN) 0.3 mg/0.3 mL SOAJ   Intramuscular   Inject 0.3 mg into the muscle daily as needed (Anaphylaxis).         . insulin glargine (LANTUS) 100  UNIT/ML injection   Subcutaneous   Inject 20 Units into the skin every morning.          . lidocaine (LIDODERM) 5 %   Transdermal   Place 1 patch onto the skin daily as needed (pain). Remove & Discard patch within 12 hours or as directed by MD         . lisinopril (PRINIVIL,ZESTRIL) 40 MG tablet   Oral   Take 40 mg by mouth every morning.         Marland Kitchen LORazepam (ATIVAN) 1 MG tablet   Oral   Take 1 tablet (1 mg total) by mouth every 6 (six) hours as needed (nausea).   30 tablet   0   . promethazine (PHENERGAN) 25 MG tablet   Oral   Take 1 tablet (25 mg total) by mouth every 8 (eight) hours as needed for nausea.   30 tablet   3   . SUMAtriptan (IMITREX) 50 MG tablet   Oral   Take 50 mg by mouth every 2 (two) hours as needed for migraine.           BP 138/70  Pulse 56  Temp(Src) 98.2 F (36.8 C) (Oral)  Resp 12  SpO2 100% Physical Exam  Nursing note and vitals reviewed. Constitutional: She is oriented to person, place, and time. She appears well-developed and well-nourished.  HENT:  Head: Normocephalic and atraumatic.  Eyes: EOM are normal. Pupils are equal, round, and reactive to light.  Neck: Neck supple. No JVD present.  Cardiovascular: Normal rate, regular rhythm and normal heart sounds.   Pulmonary/Chest: Effort normal. No respiratory distress.  Abdominal: Soft. She exhibits no distension. There is no tenderness. There is no rebound and no guarding.  Neurological: She is alert and oriented to person, place, and time.  Skin: Skin is warm and dry.    ED Course  Procedures (including critical care time) Labs Reviewed  CBC - Abnormal; Notable for the following:    RBC 3.53 (*)    Hemoglobin 11.0 (*)    HCT 32.0 (*)    All other components within normal limits  BASIC METABOLIC PANEL - Abnormal; Notable for the following:    Potassium 5.8 (*)    Glucose, Bld 208 (*)    BUN 32 (*)    Creatinine, Ser 1.97 (*)    GFR calc non Af Amer 29 (*)    GFR calc Af  Amer 33 (*)    All other components within normal limits  POCT I-STAT TROPONIN I   Dg  Chest 2 View  06/25/2013   *RADIOLOGY REPORT*  Clinical Data: 47 year old female chest pain.  Hypertension.  CHEST - 2 VIEW  Comparison: 04/17/2013 and earlier.  Findings: Stable and normal lung volumes.  Right chest Port-A-Cath re-identified.  The port is no longer accessed.  Cardiac size and mediastinal contours are within normal limits.  Visualized tracheal air column is within normal limits.  No pneumothorax, pulmonary edema, pleural effusion or confluent pulmonary opacity.  Right upper quadrant surgical clips. No acute osseous abnormality identified.  IMPRESSION: No acute cardiopulmonary abnormality.   Original Report Authenticated By: Roselyn Reef, M.D.   No diagnosis found.  MDM   Date: 06/25/2013  Rate: 53  Rhythm: sinus bradycardia rhythm  QRS Axis: normal  Intervals: normal  ST/T Wave abnormalities: normal  Conduction Disutrbances: none  Narrative Interpretation: unremarkable  Differential diagnosis includes: ACS syndrome CHF exacerbation Valvular disorder Myocarditis Pericarditis Pericardial effusion Pneumonia Pleural effusion Pulmonary edema PE Anemia Musculoskeletal pain  Pt comes in with cc of chest pain. Midsternal, left sided with nausea, diaphoresis. Has > 3 cardiac risk factors - iddm, htn, smoking, family hx. Will admit for cardiac provocative testing.  Pt is chest pain free post nitro. Requested her to inform us if the chest pain returns.       Varney Biles, MD 06/25/13 1538

## 2013-06-26 DIAGNOSIS — I10 Essential (primary) hypertension: Secondary | ICD-10-CM | POA: Diagnosis not present

## 2013-06-26 DIAGNOSIS — I252 Old myocardial infarction: Secondary | ICD-10-CM | POA: Diagnosis not present

## 2013-06-26 DIAGNOSIS — E1142 Type 2 diabetes mellitus with diabetic polyneuropathy: Secondary | ICD-10-CM | POA: Diagnosis not present

## 2013-06-26 DIAGNOSIS — I209 Angina pectoris, unspecified: Secondary | ICD-10-CM | POA: Diagnosis not present

## 2013-06-26 DIAGNOSIS — E1149 Type 2 diabetes mellitus with other diabetic neurological complication: Secondary | ICD-10-CM

## 2013-06-26 DIAGNOSIS — R1013 Epigastric pain: Secondary | ICD-10-CM | POA: Diagnosis not present

## 2013-06-26 DIAGNOSIS — R1115 Cyclical vomiting syndrome unrelated to migraine: Secondary | ICD-10-CM | POA: Diagnosis not present

## 2013-06-26 DIAGNOSIS — R079 Chest pain, unspecified: Secondary | ICD-10-CM | POA: Diagnosis not present

## 2013-06-26 DIAGNOSIS — E1129 Type 2 diabetes mellitus with other diabetic kidney complication: Secondary | ICD-10-CM | POA: Diagnosis not present

## 2013-06-26 DIAGNOSIS — R0789 Other chest pain: Secondary | ICD-10-CM | POA: Diagnosis not present

## 2013-06-26 LAB — BASIC METABOLIC PANEL
Calcium: 9.8 mg/dL (ref 8.4–10.5)
GFR calc non Af Amer: 33 mL/min — ABNORMAL LOW (ref >90)
Glucose, Bld: 234 mg/dL — ABNORMAL HIGH (ref 70–99)
Sodium: 140 mEq/L (ref 135–145)

## 2013-06-26 LAB — LIPID PANEL
LDL Cholesterol: 80 mg/dL (ref 0–99)
Triglycerides: 59 mg/dL (ref <150)

## 2013-06-26 LAB — CBC
Hemoglobin: 12.1 g/dL (ref 12.0–15.0)
MCH: 31.5 pg (ref 26.0–34.0)
MCHC: 34.9 g/dL (ref 30.0–36.0)

## 2013-06-26 LAB — GLUCOSE, CAPILLARY: Glucose-Capillary: 204 mg/dL — ABNORMAL HIGH (ref 70–99)

## 2013-06-26 LAB — TROPONIN I: Troponin I: <0.3 ng/mL (ref <0.30)

## 2013-06-26 MED ORDER — HYDRALAZINE HCL 20 MG/ML IJ SOLN: 5.0000 mg | INTRAMUSCULAR | Status: DC | PRN

## 2013-06-26 MED ORDER — LABETALOL HCL 5 MG/ML IV SOLN
10.0000 mg | Freq: Once | INTRAVENOUS | Status: AC
  Administered 2013-06-26: 10 mg via INTRAVENOUS

## 2013-06-26 MED ORDER — LORAZEPAM 2 MG/ML IJ SOLN
1.0000 mg | Freq: Four times a day (QID) | INTRAMUSCULAR | Status: DC | PRN
  Administered 2013-06-26 – 2013-06-27 (×2): 1 mg via INTRAVENOUS
  Filled 2013-06-26 (×2): qty 1

## 2013-06-26 MED ORDER — HYDROMORPHONE HCL PF 1 MG/ML IJ SOLN
1.0000 mg | Freq: Once | INTRAMUSCULAR | Status: AC
  Administered 2013-06-26: 1 mg via INTRAVENOUS
  Filled 2013-06-26: qty 1

## 2013-06-26 MED ORDER — HYDROMORPHONE HCL PF 1 MG/ML IJ SOLN
1.0000 mg | INTRAMUSCULAR | Status: DC | PRN
  Administered 2013-06-26 – 2013-06-27 (×3): 1 mg via INTRAVENOUS
  Filled 2013-06-26 (×3): qty 1

## 2013-06-26 MED ORDER — AMLODIPINE BESYLATE 10 MG PO TABS
10.0000 mg | ORAL_TABLET | Freq: Every day | ORAL | Status: DC
  Administered 2013-06-26 – 2013-06-27 (×2): 10 mg via ORAL
  Filled 2013-06-26 (×2): qty 1

## 2013-06-26 MED ORDER — LABETALOL HCL 5 MG/ML IV SOLN: 10.0000 mg | INTRAVENOUS | Status: DC | PRN

## 2013-06-26 MED ORDER — LABETALOL HCL 5 MG/ML IV SOLN
INTRAVENOUS | Status: AC
  Filled 2013-06-26: qty 4

## 2013-06-26 NOTE — Progress Notes (Signed)
Family Medicine Teaching Service Attending Note  I interviewed and examined patient [name] [name] and reviewed their tests and x-rays.  I discussed with Dr. Bonner Puna and reviewed their note for today.  I agree with their assessment and plan.     Additionally  Her presentation is very consistent with prior episodes of cyclic vomiting where she has abdomen/chest pain and severe hypertension that resolves once her vomiting improves Given that she has ruled out for ACS would be cautious about treating hypertension too aggressively given her frequent hypotension once her vomiting improves Change to dilaudid and iv ativan Continue IVF Stop nitro

## 2013-06-26 NOTE — Progress Notes (Signed)
Family Medicine Teaching Service Daily Progress Note Intern Pager: [phone]  Patient name: [name] [name] Medical record number: [id number] Date of birth: 27-Feb-1966 Age: 47 y.o. Gender: female  Primary Care Provider: Conni Slipper, MD Consultants: cardiology Code Status: FULL  Pt Overview and Major Events to Date:   6/30 Pt admitted for chest pain 7/1 chest pain resolving, persistent HTN  Assessment and Plan:  47 yo F with h/o cyclic vomiting syndrome, NSTEMI 2008 with clean left heart catheterization.   # Chest Pain  - Resolving, ECG nonischemic, troponin neg. x2.   - HTN o/n on nitro/heparin gtt  - D/C heparin gtt per cards    # Hypertension  - On lisinopril 40mg , coreg 25mg , required prn labetalol o/n.   - Add norvasc 10mg  today  # Cyclic Vomiting Syndrome  - Chronic problem currently flaring.   - On IV protonix  - Consider GI consult if hematemesis worsens   # Abdominal Pain  - Epigastric  - Mildly elevated lipase, s/p cholecystectomy   - H/o mild gastroparesis with unremarkable EGD 2012    # Hyperkalemia  - Resolved after kayexalate x1  - Will continue to monitor    # T2DM  - On lantus 20 Units qAM    FEN/GI: NPO SLIV PPx: On protonix IV  Disposition: Transfer to floor, monitor emesis/hematemesis  Subjective: Pt reporting severe abdominal pain and headache, vomiting, chest pain resolved.   Objective: Temp:  [98.2 F (36.8 C)-98.8 F (37.1 C)] 98.3 F (36.8 C) (07/01 0359) Pulse Rate:  [50-105] 89 (07/01 0700) Resp:  [10-27] 15 (07/01 0700) BP: (129-231)/(57-149) 168/87 mmHg (07/01 0700) SpO2:  [98 %-100 %] 100 % (07/01 0700) Weight:  [160 lb 0.9 oz (72.6 kg)] 160 lb 0.9 oz (72.6 kg) (06/30 1945) Physical Exam: General: WDWN in moderate distress Cardiovascular: tachycardic, no murmurs/gallops, no JVD, 2+ radial, DP pulses Respiratory: CTA bilaterally, nonlabored Abdomen: Soft, Non-distended, tender to light touch along epigastrium without  pain below umbilicus. Hypoactive BS Extremities: MAE well, full active ROM, no edema  Laboratory:  Recent Labs Lab 06/25/13 1207 06/26/13 0425  WBC 4.7 7.6  HGB 11.0* 12.1  HCT 32.0* 34.7*  PLT 235 273    Recent Labs Lab 06/25/13 1207 06/26/13 0425  NA 136 140  K 5.8* 4.4  CL 103 105  CO2 25 24  BUN 32* 23  CREATININE 1.97* 1.78*  CALCIUM 9.3 9.8  PROT 6.9  --   BILITOT 0.1*  --   ALKPHOS 83  --   ALT 9  --   AST 13  --   GLUCOSE 208* 234*    Imaging/Diagnostic Tests: ECG 6/30: no ischemic changes, peaked T waves.   Vance Gather, MD 06/26/2013, 9:34 AM PGY-1, [place] Intern pager: [phone] Amion password: [email or web address], text pages welcome

## 2013-06-26 NOTE — Progress Notes (Signed)
Pt. Seen and examined. Agree with the NP/PA-C note as written.  Chest pain has resolved, some mid-epigastric tenderness. Troponin negative x 2. Main issue is persistent hypertension.  She will definitely need better blood pressure control. She is currently on lisinopril 40 mg, coreg 25 BID, hydralazine prn.  Add norvasc 10 mg daily today. Ok to d/c heparin gtts.  Pixie Casino, MD, [facility] Attending Cardiologist [facility]

## 2013-06-26 NOTE — Progress Notes (Signed)
Notified attending of patient vomiting small amt of dark red blood. No new orders at this time.

## 2013-06-26 NOTE — Progress Notes (Signed)
[facility]  Subjective: Chest pain has resolved. Has a HA due to IV NTG. Pt continues to complain of nausea, vomiting and epigastric pain. She has not produced a BM since given Kayexalate last PM.   Objective: Vital signs in last 24 hours: Temp:  [98.2 F (36.8 C)-98.8 F (37.1 C)] 98.3 F (36.8 C) (07/01 0359) Pulse Rate:  [50-105] 89 (07/01 0700) Resp:  [10-27] 15 (07/01 0700) BP: (129-231)/(57-149) 168/87 mmHg (07/01 0700) SpO2:  [98 %-100 %] 100 % (07/01 0700) Weight:  [160 lb 0.9 oz (72.6 kg)] 160 lb 0.9 oz (72.6 kg) (06/30 1945)    Intake/Output from previous day: 06/30 0701 - 07/01 0700 In: 214.5 [I.V.:214.5] Out: 800 [Urine:800] Intake/Output this shift:    Medications Current Facility-Administered Medications  Medication Dose Route Frequency Provider Last Rate Last Dose  . 0.9 %  sodium chloride infusion  250 mL Intravenous PRN Leeanne Rio, MD 10 mL/hr at 06/26/13 0700 250 mL at 06/26/13 0700  . aspirin EC tablet 81 mg  81 mg Oral q morning - 10a Leeanne Rio, MD      . carvedilol (COREG) tablet 25 mg  25 mg Oral BID WC Leeanne Rio, MD   25 mg at 06/26/13 0727  . heparin ADULT infusion 100 units/mL (25000 units/250 mL)  900 Units/hr Intravenous Continuous Leeanne Rio, MD 9 mL/hr at 06/26/13 0700 900 Units/hr at 06/26/13 0700  . hydrALAZINE (APRESOLINE) injection 5 mg  5 mg Intravenous Q4H PRN [place], MD      . insulin glargine (LANTUS) injection 20 Units  20 Units Subcutaneous q morning - 10a Leeanne Rio, MD      . lisinopril (PRINIVIL,ZESTRIL) tablet 40 mg  40 mg Oral q morning - 10a Leeanne Rio, MD      . LORazepam (ATIVAN) tablet 1 mg  1 mg Oral Q6H PRN Leeanne Rio, MD   1 mg at 06/25/13 1912  . morphine 4 MG/ML injection 4 mg  4 mg Intravenous Q2H PRN Hilton Sinclair, MD   4 mg at 06/25/13 2314  . nitroGLYCERIN 0.2 mg/mL in dextrose 5 % infusion  3-30 mcg/min  Intravenous Titrated Leeanne Rio, MD 9 mL/hr at 06/26/13 0700 30 mcg/min at 06/26/13 0700  . pantoprazole (PROTONIX) injection 40 mg  40 mg Intravenous Q24H [place], MD   40 mg at 06/26/13 [id number]  . promethazine (PHENERGAN) injection 12.5 mg  12.5 mg Intravenous Q6H PRN Leeanne Rio, MD   12.5 mg at 06/26/13 0649  . sodium chloride 0.9 % injection 10-40 mL  10-40 mL Intracatheter Q12H Ankit Nanavati, MD   10 mL at 06/26/13 0056  . sodium chloride 0.9 % injection 10-40 mL  10-40 mL Intracatheter PRN Ankit Nanavati, MD      . sodium chloride 0.9 % injection 3 mL  3 mL Intravenous Q12H Leeanne Rio, MD   3 mL at 06/26/13 0057  . sodium chloride 0.9 % injection 3 mL  3 mL Intravenous PRN Leeanne Rio, MD       Facility-Administered Medications Ordered in Other Encounters  Medication Dose Route Frequency Provider Last Rate Last Dose  . 0.9 %  sodium chloride infusion   Intravenous Continuous Andrena Mews, MD 20 mL/hr at 04/19/13 1500 500 mL at 04/19/13 1500    PE: General appearance: alert, cooperative and no distress Lungs: clear to auscultation bilaterally Heart: regular rate and rhythm Abdomen:  Non distended, + epigastric tenderness Extremities: no LEE Pulses: 2+ and symmetric Skin: warm and dry Neurologic: Grossly normal  Lab Results:   Recent Labs  06/25/13 1207 06/26/13 0425  WBC 4.7 7.6  HGB 11.0* 12.1  HCT 32.0* 34.7*  PLT 235 273   BMET  Recent Labs  06/25/13 1207  NA 136  K 5.8*  CL 103  CO2 25  GLUCOSE 208*  BUN 32*  CREATININE 1.97*  CALCIUM 9.3   Cardiac Panel (last 3 results)  Recent Labs  06/25/13 2203  TROPONINI <0.30    Assessment/Plan  Active Problems:   HYPERTENSION, BENIGN SYSTEMIC   Uncontrolled secondary diabetes mellitus with stage 3 CKD (GFR 30-59)   Chest pain   Hyperkalemia  Plan: No further chest pain. Troponin negative x 2, including POC. EKG is normal, other than spiked T waves,  consistent with hyperkalemia (5.8 yesterday). BMP pending.  Has HA due to IV NTG. Pt continues to be hypertensive. Most recent BP is 220/116. She is also tachycardic with HR of 115. Will give IV labetalol instead of hydralazine, which can lead to reflex tachycardia. ? NST later today for risk stratification. She continues to endorse n/v and abdominal pain. No BM, despite Kayexalate last PM. Pt is diabetic. Considering n/v, abdominal fullness, epigastric pain and no BM after Kayexalate, ? Diabetic gastroparesis. Her last Hgb A1c was 05/16/13 and was 8.4. Consider GI consult. ? Trial of Reglan.    LOS: 1 day    Janeka Libman M. Ladoris Gene 06/26/2013 7:48 AM

## 2013-06-26 NOTE — Care Management Note (Signed)
    Page 1 of 1   06/26/2013     8:32:01 AM   CARE MANAGEMENT NOTE 06/26/2013  Patient:  [name] [name], [name] [name]   Account Number:  [id number]  Date Initiated:  06/26/2013  Documentation initiated by:  Elissa Hefty  Subjective/Objective Assessment:   adm w ch pain, inc lipase     Action/Plan:   lives alone, pcp dr Verdis Frederickson [place]   Anticipated DC Date:     Anticipated DC Plan:        [place]  CM consult      Choice offered to / List presented to:             Status of service:   Medicare Important Message given?   (If response is "NO", the following Medicare IM given date fields will be blank) Date Medicare IM given:   Date Additional Medicare IM given:    Discharge Disposition:    Per UR Regulation:  Reviewed for med. necessity/level of care/duration of stay  If discussed at [place] of Stay Meetings, dates discussed:    Comments:

## 2013-06-26 NOTE — Progress Notes (Signed)
Inpatient Diabetes Program Recommendations  AACE/ADA: New Consensus Statement on Inpatient Glycemic Control (2013)  Target Ranges:  Prepandial:   less than 140 mg/dL      Peak postprandial:   less than 180 mg/dL (1-2 hours)      Critically ill patients:  140 - 180 mg/dL  Patient currently ordered home dose Lantus 20 units.  No Novolog correction scale or CBGs currently ordered.  Inpatient Diabetes Program Recommendations Correction (SSI): Start Novolog sensitive scale TID + HS scale   Thank you  Raoul Pitch BSN, RN,CDE Inpatient Diabetes Coordinator [phone] (team pager)

## 2013-06-26 NOTE — H&P (Signed)
Family Medicine Teaching Service Attending Note  I interviewed and examined patient [name] [name] and reviewed their tests and x-rays.  I discussed with Dr. Darrick Grinder and reviewed their note for today.  I agree with their assessment and plan.     Additionally  See progress note for today

## 2013-06-27 ENCOUNTER — Other Ambulatory Visit: Payer: Self-pay | Admitting: Cardiology

## 2013-06-27 DIAGNOSIS — R079 Chest pain, unspecified: Secondary | ICD-10-CM | POA: Diagnosis not present

## 2013-06-27 DIAGNOSIS — I209 Angina pectoris, unspecified: Secondary | ICD-10-CM | POA: Diagnosis not present

## 2013-06-27 DIAGNOSIS — I252 Old myocardial infarction: Secondary | ICD-10-CM | POA: Diagnosis not present

## 2013-06-27 DIAGNOSIS — I10 Essential (primary) hypertension: Secondary | ICD-10-CM

## 2013-06-27 DIAGNOSIS — E1142 Type 2 diabetes mellitus with diabetic polyneuropathy: Secondary | ICD-10-CM | POA: Diagnosis not present

## 2013-06-27 DIAGNOSIS — E119 Type 2 diabetes mellitus without complications: Secondary | ICD-10-CM

## 2013-06-27 DIAGNOSIS — R0789 Other chest pain: Secondary | ICD-10-CM | POA: Diagnosis not present

## 2013-06-27 DIAGNOSIS — F17200 Nicotine dependence, unspecified, uncomplicated: Secondary | ICD-10-CM | POA: Diagnosis not present

## 2013-06-27 DIAGNOSIS — E1329 Other specified diabetes mellitus with other diabetic kidney complication: Secondary | ICD-10-CM | POA: Diagnosis not present

## 2013-06-27 DIAGNOSIS — E1129 Type 2 diabetes mellitus with other diabetic kidney complication: Secondary | ICD-10-CM | POA: Diagnosis not present

## 2013-06-27 LAB — GLUCOSE, CAPILLARY

## 2013-06-27 NOTE — Discharge Summary (Signed)
[facility] Discharge Summary  Patient name: [name] [name] Medical record number: [id number] Date of birth: 1966/09/19 Age: 47 y.o. Gender: female Date of Admission: 06/25/2013  Date of Discharge: 06/27/2013  Admitting Physician: Lind Covert, MD  Primary Care Provider: Conni Slipper, MD Consultants: [facility]  Indication for Hospitalization: Chest pain  Discharge Diagnoses/Problem List:   Pt is a 47 yo female admitted for complaint of chest pain.  Atypical chest pain: Resolved, troponins negative x3, ECG remained nonischemic, cardiology consulted . Unclear if GI or Cardiac etiology -- given risk factors of DM, HTN & FH of CAD, tobacco use; and previous NSTEMI, Cardiology recommends f/u for further outpatient evaluation including: Lexiscan Myoview/Cardiolite ST, 2D Echo to assess for LVH & diastolic function, Renal artery U/S. No evaluations were performed during this admission.   Essential HTN: had some difficulty controlling BP on lisinopril 40mg , coreg 25mg , norvasc 10mg . Opted against diuretic given h/o cyclic vomiting and concerns for volume depletion/electrolyte disturbance. At time of discharge, BP was stable, and she was discharged on home medications.   Diabetes mellitus with CKD - stage 3: gave Lantus 20 units qAM without sliding scale given patient reports of novolog intolerance (causing feet cramping), though SSI has been used in the past. Creatinine remained around baseline 1.7-1.9.  Pt was discharged on home schedule. Last Hb A1c: 8.4.   Cyclic vomiting syndrome: Known chronic history of vomiting. Experienced flare with one episode of hematemesis. Improved with prn ativan, phenergan, and PPI. No emesis in last 24 hours of admission.      Disposition: Discharged to home in stable condition.   Discharge Condition: Stable.  Brief Hospital Course: [name] [name] was admitted on 06/25/13 by the family medicine teaching  service with chest pain concerning for ACS. Cardiac workup was negative throughout admission, and chest pain resolved by 7/1. She had one episode of low-volume hematemesis and several episodes of emesis that subsided on 7/1.  These episodes correlated with uncontrolled hypertension to 231/66 while on an ACE, beta-blocker, and CCB.  With resolution of chest pain and abdominal pain/emesis, she was discharged in stable condition with instructions for PCP and cardiology follow up.   Issues for Follow Up:  1) Cessation of marijuana use 2) BP control  Significant Procedures: None  Significant Labs and Imaging:   Recent Labs Lab 06/25/13 1207 06/26/13 0425  WBC 4.7 7.6  HGB 11.0* 12.1  HCT 32.0* 34.7*  PLT 235 273    Recent Labs Lab 06/25/13 1207 06/26/13 0425  NA 136 140  K 5.8* 4.4  CL 103 105  CO2 25 24  GLUCOSE 208* 234*  BUN 32* 23  CREATININE 1.97* 1.78*  CALCIUM 9.3 9.8  ALKPHOS 83  --   AST 13  --   ALT 9  --   ALBUMIN 3.6  --    ECG 6/30: no ischemic changes, peaked T waves  7/1 ECG: nsr, no ST changes.  7/2 ECG: unchanged, sinus bradycardia  Outstanding Results: None  Discharge Medications:    Medication List         aspirin EC 81 MG tablet  Take 81 mg by mouth every morning.     carvedilol 25 MG tablet  Commonly known as:  COREG  Take 25 mg by mouth 2 (two) times daily with a meal.     diclofenac sodium 1 % Gel  Commonly known as:  VOLTAREN  Apply 2 g topically 4 (four) times daily as needed (pain).  EPIPEN 0.3 mg/0.3 mL Devi  Generic drug:  EPINEPHrine  Inject 0.3 mg into the muscle once as needed (for allegic reactions).     EPIPEN 0.3 mg/0.3 mL Soaj  Generic drug:  EPINEPHrine  Inject 0.3 mg into the muscle daily as needed (Anaphylaxis).     insulin glargine 100 UNIT/ML injection  Commonly known as:  LANTUS  Inject 20 Units into the skin every morning.     lidocaine 5 %  Commonly known as:  LIDODERM  Place 1 patch onto the skin daily  as needed (pain). Remove & Discard patch within 12 hours or as directed by MD     lisinopril 40 MG tablet  Commonly known as:  PRINIVIL,ZESTRIL  Take 40 mg by mouth every morning.     LORazepam 1 MG tablet  Commonly known as:  ATIVAN  Take 1 tablet (1 mg total) by mouth every 6 (six) hours as needed (nausea).     promethazine 25 MG tablet  Commonly known as:  PHENERGAN  Take 1 tablet (25 mg total) by mouth every 8 (eight) hours as needed for nausea.     SUMAtriptan 50 MG tablet  Commonly known as:  IMITREX  Take 50 mg by mouth every 2 (two) hours as needed for migraine.        Discharge Instructions: Please refer to Patient Instructions section of EMR for full details.  Patient was counseled important signs and symptoms that should prompt return to medical care, changes in medications, dietary instructions, activity restrictions, and follow up appointments.   Follow-Up Appointments:     Follow-up Information   Schedule an appointment as soon as possible for a visit with Conni Slipper, MD.   Contact information:   [place] [address] [phone]       Follow up with Lorretta Harp, MD. (our office will call with date and time)    Contact information:   [address] [place] [address] [phone]       Vance Gather, MD 06/27/2013, 4:04 PM PGY-1, [place]

## 2013-06-27 NOTE — Progress Notes (Signed)
Family Medicine Teaching Service Daily Progress Note Intern Pager: [phone]  Patient name: [name] [name] Medical record number: [id number] Date of birth: 08-Feb-1966 Age: 47 y.o. Gender: female  Primary Care Provider: Conni Slipper, MD Consultants: Cardiology Code Status: Full  Pt Overview and Major Events to Date:  6/30 Pt admitted for chest pain  7/1 Chest pain resolved, persistent HTN; hematemesis o/n 7/2 No emesis o/n, BPs better controlled  Assessment and Plan:  47 yo F with h/o cyclic vomiting syndrome, NSTEMI 2008 with clean left heart catheterization.  # Chest Pain  - Resolved, ECG nonischemic, troponin neg. x2.  - No telemetry events.   # Hypertension  - BPs improving on lisinopril 40mg , coreg 25mg , norvasc 10mg   # Cyclic Vomiting Syndrome  - Chronic problem currently flaring.  - On IV protonix   - Now tolerating PO  # Abdominal Pain  - Resolved   # Hyperkalemia  - Resolved after kayexalate x1  - Will continue to monitor   # T2DM  - On lantus 20 Units qAM, home dose.   FEN/GI: Carb modified SLIV  PPx: On protonix IV  Disposition: D/C today, to make f/u appt with PCP  Subjective: Pt with minimal abdominal pain, no emesis o/n, no nausea meds o/n. Denies CP, SOB.   Objective: Temp:  [98.2 F (36.8 C)-98.8 F (37.1 C)] 98.2 F (36.8 C) (07/02 0606) Pulse Rate:  [60-96] 61 (07/02 0606) Resp:  [12-27] 18 (07/02 0606) BP: (123-246)/(68-133) 123/77 mmHg (07/02 0606) SpO2:  [93 %-100 %] 100 % (07/02 0606) Physical Exam: General: 47 yo female in NAD Cardiovascular: tachycardic, no murmurs/gallops, no JVD, 2+ radial, DP pulses  Respiratory: CTA bilaterally, nonlabored. Abdomen: Soft, Non-distended, very minimal abdominal tenderness.  Extremities: MAE well, full active ROM, no edema  Laboratory:  Recent Labs Lab 06/25/13 1207 06/26/13 0425  WBC 4.7 7.6  HGB 11.0* 12.1  HCT 32.0* 34.7*  PLT 235 273    Recent Labs Lab 06/25/13 1207  06/26/13 0425  NA 136 140  K 5.8* 4.4  CL 103 105  CO2 25 24  BUN 32* 23  CREATININE 1.97* 1.78*  CALCIUM 9.3 9.8  PROT 6.9  --   BILITOT 0.1*  --   ALKPHOS 83  --   ALT 9  --   AST 13  --   GLUCOSE 208* 234*    Imaging/Diagnostic Tests: ECG 6/30: no ischemic changes, peaked T waves 7/1 ECG: nsr, no ST changes. 7/2 ECG: unchanged, sinus bradycardia  Vance Gather, MD 06/27/2013, 8:10 AM PGY-1, [place] Intern pager: [phone], text pages welcome

## 2013-06-27 NOTE — Progress Notes (Signed)
Subjective: No chest pain, still with some abd pain.  She tells me she is going home  Objective: Vital signs in last 24 hours: Temp:  [98.2 F (36.8 C)-98.8 F (37.1 C)] 98.2 F (36.8 C) (07/02 0606) Pulse Rate:  [60-92] 61 (07/02 0606) Resp:  [13-19] 18 (07/02 0606) BP: (123-203)/(68-133) 123/77 mmHg (07/02 0606) SpO2:  [93 %-100 %] 100 % (07/02 0606) Weight change:    Intake/Output from previous day: +54   Intake/Output this shift: Total I/O In: 120 [P.O.:120] Out: -   PE: General:Pleasant affect, NAD Neck:supple, no JVD, no bruits  Heart:S1S2 RRR without murmur, gallup, rub or click Lungs:clear without rales, rhonchi, or wheezes [id number], + upper abd tenderness, + BS, do not palpate liver spleen or masses Ext:no lower ext edema, 2+ pedal pulses, 2+ radial pulses Neuro:alert and oriented, MAE, follows commands, + facial symmetry   Lab Results:  Recent Labs  06/25/13 1207 06/26/13 0425  WBC 4.7 7.6  HGB 11.0* 12.1  HCT 32.0* 34.7*  PLT 235 273   BMET  Recent Labs  06/25/13 1207 06/26/13 0425  NA 136 140  K 5.8* 4.4  CL 103 105  CO2 25 24  GLUCOSE 208* 234*  BUN 32* 23  CREATININE 1.97* 1.78*  CALCIUM 9.3 9.8    Recent Labs  06/25/13 2203 06/26/13 1023  TROPONINI <0.30 <0.30    Lab Results  Component Value Date   CHOL 176 06/26/2013   HDL 84 06/26/2013   LDLCALC 80 06/26/2013   LDLDIRECT 102* 02/14/2012   TRIG 59 06/26/2013   CHOLHDL 2.1 06/26/2013   Lab Results  Component Value Date   HGBA1C 8.4 05/16/2013     Lab Results  Component Value Date   TSH 0.625 06/25/2013    Hepatic Function Panel  Recent Labs  06/25/13 1207  PROT 6.9  ALBUMIN 3.6  AST 13  ALT 9  ALKPHOS 83  BILITOT 0.1*  BILIDIR <0.1  IBILI NOT CALCULATED    Recent Labs  06/26/13 0425  CHOL 176   No results found for this basename: PROTIME,  in the last 72 hours      Studies/Results: Dg Chest 2 View  06/25/2013   *RADIOLOGY REPORT*  Clinical Data:  47 year old female chest pain.  Hypertension.  CHEST - 2 VIEW  Comparison: 04/17/2013 and earlier.  Findings: Stable and normal lung volumes.  Right chest Port-A-Cath re-identified.  The port is no longer accessed.  Cardiac size and mediastinal contours are within normal limits.  Visualized tracheal air column is within normal limits.  No pneumothorax, pulmonary edema, pleural effusion or confluent pulmonary opacity.  Right upper quadrant surgical clips. No acute osseous abnormality identified.  IMPRESSION: No acute cardiopulmonary abnormality.   Original Report Authenticated By: Roselyn Reef, M.D.    Medications: I have reviewed the patient's current medications. Scheduled Meds: . amLODipine  10 mg Oral Daily  . aspirin EC  81 mg Oral q morning - 10a  . carvedilol  25 mg Oral BID WC  . insulin glargine  20 Units Subcutaneous q morning - 10a  . lisinopril  40 mg Oral q morning - 10a  . pantoprazole (PROTONIX) IV  40 mg Intravenous Q24H  . sodium chloride  10-40 mL Intracatheter Q12H  . sodium chloride  3 mL Intravenous Q12H   Continuous Infusions:  PRN Meds:.sodium chloride, hydrALAZINE, HYDROmorphone, labetalol, LORazepam, promethazine, sodium chloride, sodium chloride  Assessment/Plan: Active Problems:   HYPERTENSION, BENIGN SYSTEMIC   Uncontrolled secondary  diabetes mellitus with stage 3 CKD (GFR 30-59)   Chest pain   Hyperkalemia   PLAN: elevated Lipase, improved BP though trending upward again this AM.  Negative MI. Family hx CAD. ? nuc study in future to risk stratify with multiple risk factors  LOS: 2 days   Time spent with pt. :15 minutes. [facility] R  Nurse Practitioner Certified Pager [phone] 06/27/2013, 10:57 AM

## 2013-06-27 NOTE — Progress Notes (Signed)
Family Medicine Teaching Service Attending Note  I interviewed and examined patient [name] [name] and reviewed their tests and x-rays.  I discussed with Dr. Bonner Puna and reviewed their note for today.  I agree with their assessment and plan.     Additionally  Feels well Ok to discharge  Encouraged to stop tobacco comletely

## 2013-06-27 NOTE — Progress Notes (Addendum)
I have seen and evaluated the patient this AM along with Kerin Ransom, PA. I agree with her findings, examination as well as impression recommendations.  Active Problems:   Essential hypertension, malignant   Uncontrolled secondary diabetes mellitus with stage 3 CKD (GFR 30-59)   Chest pain   Hyperkalemia    She feels much better.  BP improved, but still not at goal.  She reports being told to plan D/c today.  I agree that she can have OP eval of CP due to negative Troponins & non-convincing ECG & resolution of CP with BP control.  No further CP.   Unfortunately, she has chronic N/V - since 2003 Surgery, not sure if meds are being absorbed.  On High dose ACE-I, BB & CCB - not on diuretic, but with N/V, would be reluctant to use diuretic for fear of electrolyte abnormalities.  Check RA [place] to assess for RAS.  Poorly controlled HTN & CP that may be related to HTN Urgency":  will check OP 2 D Echo to assess for LVH & diastolic function.  CP - unclear if GI or Cardiac -- given RFs of DM, HTN & FH of CAD, & Smoker   Will check OP Lexiscan Myoview/Cardiolite ST (Lexiscan & not TM due to desire to avoid holding Carvedilol).   ROV with Dr. Gwenlyn Found or me post Echo, Cardiolite & RA [place].  MD Time with pt: 10 min  HARDING,DAVID W, M.D., M.S. [facility] [address] [place], [place]  [id number]  [phone] Pager # [phone] 06/27/2013 11:39 AM

## 2013-06-28 LAB — GLUCOSE, CAPILLARY: Glucose-Capillary: 198 mg/dL — ABNORMAL HIGH (ref 70–99)

## 2013-06-28 NOTE — Discharge Summary (Signed)
I have reviewed this discharge summary and agree.    

## 2013-07-05 ENCOUNTER — Emergency Department (HOSPITAL_COMMUNITY)
Admission: EM | Admit: 2013-07-05 | Discharge: 2013-07-05 | Disposition: A | Discharge status: Home / Self Care | Payer: Medicare Other | Attending: Emergency Medicine | Admitting: Emergency Medicine

## 2013-07-05 ENCOUNTER — Encounter (HOSPITAL_COMMUNITY): Payer: Self-pay | Admitting: Cardiology

## 2013-07-05 ENCOUNTER — Other Ambulatory Visit: Payer: Self-pay

## 2013-07-05 ENCOUNTER — Emergency Department (HOSPITAL_COMMUNITY): Payer: Medicare Other

## 2013-07-05 DIAGNOSIS — F3289 Other specified depressive episodes: Secondary | ICD-10-CM | POA: Insufficient documentation

## 2013-07-05 DIAGNOSIS — K3184 Gastroparesis: Secondary | ICD-10-CM | POA: Insufficient documentation

## 2013-07-05 DIAGNOSIS — K219 Gastro-esophageal reflux disease without esophagitis: Secondary | ICD-10-CM | POA: Insufficient documentation

## 2013-07-05 DIAGNOSIS — F17200 Nicotine dependence, unspecified, uncomplicated: Secondary | ICD-10-CM | POA: Diagnosis not present

## 2013-07-05 DIAGNOSIS — Z7982 Long term (current) use of aspirin: Secondary | ICD-10-CM | POA: Diagnosis not present

## 2013-07-05 DIAGNOSIS — K5900 Constipation, unspecified: Secondary | ICD-10-CM | POA: Diagnosis not present

## 2013-07-05 DIAGNOSIS — I129 Hypertensive chronic kidney disease with stage 1 through stage 4 chronic kidney disease, or unspecified chronic kidney disease: Secondary | ICD-10-CM | POA: Insufficient documentation

## 2013-07-05 DIAGNOSIS — Z79899 Other long term (current) drug therapy: Secondary | ICD-10-CM | POA: Insufficient documentation

## 2013-07-05 DIAGNOSIS — I252 Old myocardial infarction: Secondary | ICD-10-CM | POA: Diagnosis not present

## 2013-07-05 DIAGNOSIS — F411 Generalized anxiety disorder: Secondary | ICD-10-CM | POA: Diagnosis not present

## 2013-07-05 DIAGNOSIS — Z8719 Personal history of other diseases of the digestive system: Secondary | ICD-10-CM | POA: Insufficient documentation

## 2013-07-05 DIAGNOSIS — F329 Major depressive disorder, single episode, unspecified: Secondary | ICD-10-CM | POA: Diagnosis not present

## 2013-07-05 DIAGNOSIS — R5381 Other malaise: Secondary | ICD-10-CM | POA: Diagnosis not present

## 2013-07-05 DIAGNOSIS — Z794 Long term (current) use of insulin: Secondary | ICD-10-CM | POA: Insufficient documentation

## 2013-07-05 DIAGNOSIS — G589 Mononeuropathy, unspecified: Secondary | ICD-10-CM | POA: Insufficient documentation

## 2013-07-05 DIAGNOSIS — R1115 Cyclical vomiting syndrome unrelated to migraine: Secondary | ICD-10-CM | POA: Insufficient documentation

## 2013-07-05 DIAGNOSIS — R05 Cough: Secondary | ICD-10-CM | POA: Diagnosis not present

## 2013-07-05 DIAGNOSIS — N289 Disorder of kidney and ureter, unspecified: Secondary | ICD-10-CM

## 2013-07-05 DIAGNOSIS — N390 Urinary tract infection, site not specified: Secondary | ICD-10-CM | POA: Insufficient documentation

## 2013-07-05 DIAGNOSIS — E1149 Type 2 diabetes mellitus with other diabetic neurological complication: Secondary | ICD-10-CM | POA: Diagnosis not present

## 2013-07-05 DIAGNOSIS — R109 Unspecified abdominal pain: Secondary | ICD-10-CM | POA: Diagnosis not present

## 2013-07-05 LAB — CBC WITH DIFFERENTIAL/PLATELET
Basophils Absolute: 0 10*3/uL (ref 0.0–0.1)
Basophils Relative: 0 % (ref 0–1)
HCT: 33.6 % — ABNORMAL LOW (ref 36.0–46.0)
MCHC: 35.4 g/dL (ref 30.0–36.0)
Monocytes Absolute: 0.5 10*3/uL (ref 0.1–1.0)
Neutro Abs: 2.5 10*3/uL (ref 1.7–7.7)
RDW: 13.4 % (ref 11.5–15.5)

## 2013-07-05 LAB — URINALYSIS, ROUTINE W REFLEX MICROSCOPIC
Glucose, UA: NEGATIVE mg/dL
Specific Gravity, Urine: 1.015 (ref 1.005–1.030)
pH: 5.5 (ref 5.0–8.0)

## 2013-07-05 LAB — RAPID URINE DRUG SCREEN, HOSP PERFORMED
Amphetamines: NOT DETECTED
Benzodiazepines: NOT DETECTED
Opiates: NOT DETECTED

## 2013-07-05 LAB — URINE MICROSCOPIC-ADD ON

## 2013-07-05 LAB — COMPREHENSIVE METABOLIC PANEL
AST: 13 U/L (ref 0–37)
Albumin: 3.9 g/dL (ref 3.5–5.2)
Calcium: 9.6 mg/dL (ref 8.4–10.5)
Chloride: 106 mEq/L (ref 96–112)
Creatinine, Ser: 2 mg/dL — ABNORMAL HIGH (ref 0.50–1.10)

## 2013-07-05 MED ORDER — PROMETHAZINE HCL 25 MG/ML IJ SOLN
12.5000 mg | Freq: Once | INTRAMUSCULAR | Status: AC
  Administered 2013-07-05: 12.5 mg via INTRAVENOUS
  Filled 2013-07-05: qty 1

## 2013-07-05 MED ORDER — HYDROMORPHONE HCL PF 1 MG/ML IJ SOLN
0.5000 mg | Freq: Once | INTRAMUSCULAR | Status: AC
  Administered 2013-07-05: 0.5 mg via INTRAVENOUS
  Filled 2013-07-05: qty 1

## 2013-07-05 MED ORDER — SODIUM CHLORIDE 0.9 % IV BOLUS (SEPSIS)
500.0000 mL | Freq: Once | INTRAVENOUS | Status: AC
  Administered 2013-07-05: 500 mL via INTRAVENOUS

## 2013-07-05 MED ORDER — HYDROCODONE-ACETAMINOPHEN 5-325 MG PO TABS: 1.0000 | ORAL_TABLET | Freq: Four times a day (QID) | ORAL | Status: DC | PRN

## 2013-07-05 MED ORDER — SODIUM CHLORIDE 0.9 % IV BOLUS (SEPSIS): 1000.0000 mL | Freq: Once | INTRAVENOUS | Status: DC

## 2013-07-05 MED ORDER — HYDROMORPHONE HCL PF 1 MG/ML IJ SOLN
1.0000 mg | Freq: Once | INTRAMUSCULAR | Status: AC
  Administered 2013-07-05: 1 mg via INTRAVENOUS
  Filled 2013-07-05: qty 1

## 2013-07-05 MED ORDER — MAGNESIUM CITRATE PO SOLN: 1.0000 | Freq: Once | ORAL | Status: DC

## 2013-07-05 MED ORDER — LORAZEPAM 1 MG PO TABS: 1.0000 mg | ORAL_TABLET | Freq: Three times a day (TID) | ORAL | Status: DC | PRN

## 2013-07-05 MED ORDER — METOCLOPRAMIDE HCL 5 MG/ML IJ SOLN
10.0000 mg | Freq: Once | INTRAMUSCULAR | Status: AC
  Administered 2013-07-05: 10 mg via INTRAVENOUS
  Filled 2013-07-05: qty 2

## 2013-07-05 MED ORDER — HEPARIN (PORCINE) LOCK FLUSH 10 UNIT/ML IV SOLN: 10.0000 [IU] | Freq: Once | INTRAVENOUS | Status: DC

## 2013-07-05 MED ORDER — CEPHALEXIN 500 MG PO CAPS: 500.0000 mg | ORAL_CAPSULE | Freq: Two times a day (BID) | ORAL | Status: DC

## 2013-07-05 MED ORDER — HEPARIN SOD (PORK) LOCK FLUSH 100 UNIT/ML IV SOLN
500.0000 [IU] | Freq: Once | INTRAVENOUS | Status: AC
  Administered 2013-07-05: 500 [IU] via INTRAVENOUS

## 2013-07-05 NOTE — ED Notes (Signed)
Pt reports that she has a port-a-cath and wants blood drawn from this.

## 2013-07-05 NOTE — ED Notes (Signed)
Pt reports epigastric pain with vomiting that started this afternoon. Pt with active vomiting at triage. Reports that she has been unable to keep anything down this afternoon. Pt reports lower back pain with the symptoms. Pt reports pain is dull ache and a heaviness.

## 2013-07-05 NOTE — ED Notes (Addendum)
Port accessed per policy at request of Arts development officer. Pt tol well.Labs drawn and sent to lab.

## 2013-07-05 NOTE — ED Notes (Signed)
States has a port due to phenergan and morphine "burning up veins". Crying, apologetic.

## 2013-07-05 NOTE — ED Provider Notes (Signed)
History    CSN: [id number] Arrival date & time 07/05/13  1615  First MD Initiated Contact with Patient 07/05/13 1701     Chief Complaint  Patient presents with  . Abdominal Pain  . Emesis   (Consider location/radiation/quality/duration/timing/severity/associated sxs/prior Treatment) HPI\ [name] [name] is a 47 y.o. female who presents to ED with complaint of nausea, vomiting, abdominal pain that began 2 days ago. States worsened today. States has hx of cyclical vomiting syndrome. States took her ativan, phenergan this morning, but does not think she kept it down. Stats pain is mainly in the epigastric area. Emesis is clear, with no blood. Normal bowel movement yesterday. Denies any fever, chills, urinary symptoms.   Past Medical History  Diagnosis Date  . PANCREATITIS 05/09/2008  . Cyclic vomiting syndrome   . Hypertension   . Complication of anesthesia     "problems waking up"  . Heart murmur   . Myocardial infarction 07/2007    "they say I've had a silent one; I don't know"  . Pneumonia   . Anemia   . Blood transfusion   . GERD (gastroesophageal reflux disease)   . Anxiety   . Depression   . Smoker   . Marijuana smoker, continuous   . NEUROPATHY, DIABETIC 02/23/2007  . Type II diabetes mellitus   . Diabetic gastroparesis     /e-chart   Past Surgical History  Procedure Laterality Date  . Port-a-cath placement  ~ 2008    right chest; "poor access; frequent hospitalizations"  . Laparotomy and lysis of adhesions    . Abdominal hysterectomy  02/2002  . Cholecystectomy  1980's  . Cardiac catheterization    . Dilation and evacuation  X3  . Left hand surgery    . Cataract extraction w/ intraocular lens  implant, bilateral    . Colonoscopy    . Upper gi endoscopy     Family History  Problem Relation Age of Onset  . Diabetes type II Mother   . Diabetes Mother   . Hypertension Mother   . Diabetes type II Sister   . Diabetes Sister   . Hypertension Sister   .  Hypertension Brother    History  Substance Use Topics  . Smoking status: Current Some Day Smoker -- 0.50 packs/day for 22 years    Types: Cigarettes    Last Attempt to Quit: 02/22/2012  . Smokeless tobacco: Never Used  . Alcohol Use: No   OB History   Grav Para Term Preterm Abortions TAB SAB Ect Mult Living                 Review of Systems  Constitutional: Positive for fatigue. Negative for fever and chills.  Respiratory: Negative.   Cardiovascular: Negative.   Gastrointestinal: Positive for nausea, vomiting and abdominal pain. Negative for diarrhea, constipation and blood in stool.  Genitourinary: Negative for dysuria and flank pain.  Neurological: Positive for weakness.  All other systems reviewed and are negative.    Allergies  Bee venom; Acetaminophen; Novolog; and Erythromycin  Home Medications   Current Outpatient Rx  Name  Route  Sig  Dispense  Refill  . aspirin EC 81 MG tablet   Oral   Take 81 mg by mouth every morning.         . carvedilol (COREG) 25 MG tablet   Oral   Take 25 mg by mouth 2 (two) times daily with a meal.         . diclofenac  sodium (VOLTAREN) 1 % GEL   Topical   Apply 2 g topically 4 (four) times daily as needed (pain).         Marland Kitchen EPINEPHrine (EPIPEN) 0.3 mg/0.3 mL DEVI   Intramuscular   Inject 0.3 mg into the muscle once as needed (for allegic reactions).         . insulin glargine (LANTUS) 100 UNIT/ML injection   Subcutaneous   Inject 20 Units into the skin every morning.          . lidocaine (LIDODERM) 5 %   Transdermal   Place 1 patch onto the skin daily as needed (pain). Remove & Discard patch within 12 hours or as directed by MD         . lisinopril (PRINIVIL,ZESTRIL) 40 MG tablet   Oral   Take 40 mg by mouth every morning.         Marland Kitchen LORazepam (ATIVAN) 1 MG tablet   Oral   Take 1 tablet (1 mg total) by mouth every 6 (six) hours as needed (nausea).   30 tablet   0   . promethazine (PHENERGAN) 25 MG  tablet   Oral   Take 1 tablet (25 mg total) by mouth every 8 (eight) hours as needed for nausea.   30 tablet   3   . SUMAtriptan (IMITREX) 50 MG tablet   Oral   Take 50 mg by mouth every 2 (two) hours as needed for migraine.           BP 177/92  Pulse 85  Temp(Src) 98.5 F (36.9 C) (Oral)  Resp 20  SpO2 97% Physical Exam  Nursing note and vitals reviewed. Constitutional: She is oriented to person, place, and time. She appears well-developed and well-nourished. No distress.  Eyes: Conjunctivae are normal.  Neck: Neck supple.  Cardiovascular: Normal rate, regular rhythm and normal heart sounds.   Pulmonary/Chest: Effort normal and breath sounds normal. No respiratory distress. She has no wheezes. She has no rales.  Abdominal: Soft. Bowel sounds are normal. She exhibits no distension. There is tenderness. There is guarding. There is no rebound.  Epigastric tenderness, RUQ and LUQ tenderness  Musculoskeletal: She exhibits no edema.  Neurological: She is alert and oriented to person, place, and time.  Skin: Skin is warm and dry.    ED Course  Procedures (including critical care time)  Results for orders placed during the hospital encounter of 07/05/13  CBC WITH DIFFERENTIAL      Result Value Range   WBC 5.8  4.0 - 10.5 K/uL   RBC 3.79 (*) 3.87 - 5.11 MIL/uL   Hemoglobin 11.9 (*) 12.0 - 15.0 g/dL   HCT 33.6 (*) 36.0 - 46.0 %   MCV 88.7  78.0 - 100.0 fL   MCH 31.4  26.0 - 34.0 pg   MCHC 35.4  30.0 - 36.0 g/dL   RDW 13.4  11.5 - 15.5 %   Platelets 210  150 - 400 K/uL   Neutrophils Relative % 44  43 - 77 %   Neutro Abs 2.5  1.7 - 7.7 K/uL   Lymphocytes Relative 44  12 - 46 %   Lymphs Abs 2.5  0.7 - 4.0 K/uL   Monocytes Relative 9  3 - 12 %   Monocytes Absolute 0.5  0.1 - 1.0 K/uL   Eosinophils Relative 2  0 - 5 %   Eosinophils Absolute 0.1  0.0 - 0.7 K/uL   Basophils Relative 0  0 -  1 %   Basophils Absolute 0.0  0.0 - 0.1 K/uL  COMPREHENSIVE METABOLIC PANEL       Result Value Range   Sodium 141  135 - 145 mEq/L   Potassium 3.8  3.5 - 5.1 mEq/L   Chloride 106  96 - 112 mEq/L   CO2 24  19 - 32 mEq/L   Glucose, Bld 121 (*) 70 - 99 mg/dL   BUN 36 (*) 6 - 23 mg/dL   Creatinine, Ser 2.00 (*) 0.50 - 1.10 mg/dL   Calcium 9.6  8.4 - 10.5 mg/dL   Total Protein 7.5  6.0 - 8.3 g/dL   Albumin 3.9  3.5 - 5.2 g/dL   AST 13  0 - 37 U/L   ALT 8  0 - 35 U/L   Alkaline Phosphatase 88  39 - 117 U/L   Total Bilirubin 0.4  0.3 - 1.2 mg/dL   GFR calc non Af Amer 28 (*) >90 mL/min   GFR calc Af Amer 33 (*) >90 mL/min  LIPASE, BLOOD      Result Value Range   Lipase 43  11 - 59 U/L     Dg Abd Acute W/chest  07/05/2013   *RADIOLOGY REPORT*  Clinical Data: Pain, vomiting, cough.  ACUTE ABDOMEN SERIES (ABDOMEN 2 VIEW & CHEST 1 VIEW)  Comparison: Chest x-ray 06/25/2013  Findings: Right Port-A-Cath is unchanged.  Heart is normal size. Lungs are clear.  No effusions.  Prior cholecystectomy.  Large stool burden throughout the colon. No evidence of bowel obstruction, organomegaly, free air or suspicious calcification.  Calcified phleboliths in the pelvis.  No acute bony abnormality.  IMPRESSION: Large stool burden.  No evidence of bowel obstruction or free air.  No acute cardiopulmonary disease.   Original Report Authenticated By: Rolm Baptise, M.D.     No results found.   1. Cyclical vomiting   2. Renal insufficiency   3. Constipation   4. UTI (lower urinary tract infection)     MDM  PT with typical for her cyclical nausea, vomiting, abdominal pain. Pt came in crying, screaming in pain, vomiting, given dilaudid for pain, Reglan which initially helped her symptoms. Pt fell asleep when went to reassess, pt admitted her symptoms are coming back. Given another dose of dilaudid and phenergan.   Filed Vitals:   07/05/13 1619 07/05/13 1723  BP: 177/92 180/85  Pulse: 85 76  Temp: 98.5 F (36.9 C) 98.2 F (36.8 C)  TempSrc: Oral Axillary  Resp: 20   SpO2: 97% 100%      [name] Devonshire A Samone Guhl, PA-C 07/06/13 1457

## 2013-07-05 NOTE — ED Notes (Signed)
IV team notified to access port-a-cath.

## 2013-07-06 ENCOUNTER — Ambulatory Visit: Payer: Medicare Other | Admitting: Family Medicine

## 2013-07-06 LAB — URINE CULTURE

## 2013-07-07 ENCOUNTER — Emergency Department (HOSPITAL_COMMUNITY)
Admission: EM | Admit: 2013-07-07 | Discharge: 2013-07-08 | Disposition: A | Discharge status: Home / Self Care | Payer: Medicare Other | Attending: Emergency Medicine | Admitting: Emergency Medicine

## 2013-07-07 ENCOUNTER — Emergency Department (HOSPITAL_COMMUNITY): Payer: Medicare Other

## 2013-07-07 ENCOUNTER — Encounter (HOSPITAL_COMMUNITY): Payer: Self-pay | Admitting: *Deleted

## 2013-07-07 DIAGNOSIS — Z8719 Personal history of other diseases of the digestive system: Secondary | ICD-10-CM | POA: Diagnosis not present

## 2013-07-07 DIAGNOSIS — Z862 Personal history of diseases of the blood and blood-forming organs and certain disorders involving the immune mechanism: Secondary | ICD-10-CM | POA: Insufficient documentation

## 2013-07-07 DIAGNOSIS — Z7982 Long term (current) use of aspirin: Secondary | ICD-10-CM | POA: Diagnosis not present

## 2013-07-07 DIAGNOSIS — F411 Generalized anxiety disorder: Secondary | ICD-10-CM | POA: Diagnosis not present

## 2013-07-07 DIAGNOSIS — Z8701 Personal history of pneumonia (recurrent): Secondary | ICD-10-CM | POA: Insufficient documentation

## 2013-07-07 DIAGNOSIS — R1115 Cyclical vomiting syndrome unrelated to migraine: Secondary | ICD-10-CM | POA: Diagnosis not present

## 2013-07-07 DIAGNOSIS — E119 Type 2 diabetes mellitus without complications: Secondary | ICD-10-CM | POA: Diagnosis not present

## 2013-07-07 DIAGNOSIS — F17200 Nicotine dependence, unspecified, uncomplicated: Secondary | ICD-10-CM | POA: Diagnosis not present

## 2013-07-07 DIAGNOSIS — Z8679 Personal history of other diseases of the circulatory system: Secondary | ICD-10-CM | POA: Insufficient documentation

## 2013-07-07 DIAGNOSIS — I10 Essential (primary) hypertension: Secondary | ICD-10-CM | POA: Diagnosis not present

## 2013-07-07 DIAGNOSIS — F329 Major depressive disorder, single episode, unspecified: Secondary | ICD-10-CM | POA: Insufficient documentation

## 2013-07-07 DIAGNOSIS — Z794 Long term (current) use of insulin: Secondary | ICD-10-CM | POA: Insufficient documentation

## 2013-07-07 DIAGNOSIS — Z79899 Other long term (current) drug therapy: Secondary | ICD-10-CM | POA: Diagnosis not present

## 2013-07-07 DIAGNOSIS — I252 Old myocardial infarction: Secondary | ICD-10-CM | POA: Insufficient documentation

## 2013-07-07 DIAGNOSIS — R000 Tachycardia, unspecified: Secondary | ICD-10-CM | POA: Diagnosis not present

## 2013-07-07 DIAGNOSIS — E1149 Type 2 diabetes mellitus with other diabetic neurological complication: Secondary | ICD-10-CM | POA: Insufficient documentation

## 2013-07-07 DIAGNOSIS — R112 Nausea with vomiting, unspecified: Secondary | ICD-10-CM | POA: Diagnosis not present

## 2013-07-07 DIAGNOSIS — F3289 Other specified depressive episodes: Secondary | ICD-10-CM | POA: Insufficient documentation

## 2013-07-07 LAB — CBC WITH DIFFERENTIAL/PLATELET
Eosinophils Absolute: 0 10*3/uL (ref 0.0–0.7)
Eosinophils Relative: 1 % (ref 0–5)
HCT: 35.4 % — ABNORMAL LOW (ref 36.0–46.0)
Lymphocytes Relative: 22 % (ref 12–46)
Lymphs Abs: 1.8 10*3/uL (ref 0.7–4.0)
MCH: 31.8 pg (ref 26.0–34.0)
MCV: 88.7 fL (ref 78.0–100.0)
Monocytes Absolute: 0.5 10*3/uL (ref 0.1–1.0)
Platelets: 213 10*3/uL (ref 150–400)
RBC: 3.99 MIL/uL (ref 3.87–5.11)

## 2013-07-07 LAB — RAPID URINE DRUG SCREEN, HOSP PERFORMED
Amphetamines: NOT DETECTED
Barbiturates: NOT DETECTED
Benzodiazepines: NOT DETECTED
Cocaine: NOT DETECTED
Opiates: NOT DETECTED
Tetrahydrocannabinol: POSITIVE — AB

## 2013-07-07 LAB — URINE MICROSCOPIC-ADD ON

## 2013-07-07 LAB — URINALYSIS, ROUTINE W REFLEX MICROSCOPIC
Bilirubin Urine: NEGATIVE
Glucose, UA: >1000 mg/dL — AB
Ketones, ur: 15 mg/dL — AB
Nitrite: NEGATIVE
Specific Gravity, Urine: 1.019 (ref 1.005–1.030)
pH: 6.5 (ref 5.0–8.0)

## 2013-07-07 LAB — COMPREHENSIVE METABOLIC PANEL
AST: 16 U/L (ref 0–37)
Albumin: 4.3 g/dL (ref 3.5–5.2)
Alkaline Phosphatase: 101 U/L (ref 39–117)
Chloride: 99 mEq/L (ref 96–112)
Potassium: 3.7 mEq/L (ref 3.5–5.1)
Total Bilirubin: 0.5 mg/dL (ref 0.3–1.2)
Total Protein: 7.8 g/dL (ref 6.0–8.3)

## 2013-07-07 LAB — LIPASE, BLOOD: Lipase: 51 U/L (ref 11–59)

## 2013-07-07 LAB — AMYLASE: Amylase: 173 U/L — ABNORMAL HIGH (ref 0–105)

## 2013-07-07 LAB — POCT I-STAT TROPONIN I: Troponin i, poc: 0 ng/mL (ref 0.00–0.08)

## 2013-07-07 MED ORDER — PANTOPRAZOLE SODIUM 40 MG IV SOLR
40.0000 mg | Freq: Once | INTRAVENOUS | Status: AC
  Administered 2013-07-07: 40 mg via INTRAVENOUS
  Filled 2013-07-07: qty 40

## 2013-07-07 MED ORDER — OXYCODONE HCL 5 MG PO TABS: 5.0000 mg | ORAL_TABLET | Freq: Three times a day (TID) | ORAL | Status: DC | PRN

## 2013-07-07 MED ORDER — PROMETHAZINE HCL 25 MG/ML IJ SOLN
25.0000 mg | Freq: Once | INTRAMUSCULAR | Status: AC
  Administered 2013-07-07: 25 mg via INTRAVENOUS
  Filled 2013-07-07: qty 1

## 2013-07-07 MED ORDER — SODIUM CHLORIDE 0.9 % IJ SOLN: 10.0000 mL | INTRAMUSCULAR | Status: DC | PRN

## 2013-07-07 MED ORDER — MORPHINE SULFATE 4 MG/ML IJ SOLN
6.0000 mg | Freq: Once | INTRAMUSCULAR | Status: DC
  Filled 2013-07-07: qty 2

## 2013-07-07 MED ORDER — ONDANSETRON HCL 4 MG/2ML IJ SOLN: 4.0000 mg | Freq: Once | INTRAMUSCULAR | Status: DC

## 2013-07-07 MED ORDER — SODIUM CHLORIDE 0.9 % IV BOLUS (SEPSIS): 1000.0000 mL | Freq: Once | INTRAVENOUS | Status: DC

## 2013-07-07 MED ORDER — HYDROMORPHONE HCL PF 1 MG/ML IJ SOLN
1.0000 mg | INTRAMUSCULAR | Status: AC
  Administered 2013-07-07: 1 mg via INTRAVENOUS
  Filled 2013-07-07: qty 1

## 2013-07-07 MED ORDER — SODIUM CHLORIDE 0.9 % IV BOLUS (SEPSIS)
1000.0000 mL | Freq: Once | INTRAVENOUS | Status: AC
  Administered 2013-07-07: 1000 mL via INTRAVENOUS

## 2013-07-07 NOTE — ED Provider Notes (Signed)
Medical screening examination/treatment/procedure(s) were performed by non-physician practitioner and as supervising physician I was immediately available for consultation/collaboration.  Babette Relic, MD 07/07/13 2105

## 2013-07-07 NOTE — ED Notes (Signed)
IV team contacted regarding pt port access

## 2013-07-07 NOTE — ED Provider Notes (Signed)
History    CSN: [id number] Arrival date & time 07/07/13  2001  First MD Initiated Contact with Patient 07/07/13 2042     Chief Complaint  Patient presents with  . Abdominal Pain   (Consider location/radiation/quality/duration/timing/severity/associated sxs/prior Treatment) HPI Comments: [name] [name] is a 47 y.o. female h/o cyclic vomiting syndrome, here with an acute exacerbation of the same.  She states for the past 5 days, the patient has had abdominal pain and 1 episode of non-bloody emesis per day.  This is despite her home phenergan and ativan.  Patient states this is typical of her flare and she can not fall asleep tonight.  She denies fevers, chills, diarrhea, or other systemic findings.  ROS is otherwise negative.  The history is provided by the patient.   Past Medical History  Diagnosis Date  . PANCREATITIS 05/09/2008  . Cyclic vomiting syndrome   . Hypertension   . Complication of anesthesia     "problems waking up"  . Heart murmur   . Myocardial infarction 07/2007    "they say I've had a silent one; I don't know"  . Pneumonia   . Anemia   . Blood transfusion   . GERD (gastroesophageal reflux disease)   . Anxiety   . Depression   . Smoker   . Marijuana smoker, continuous   . NEUROPATHY, DIABETIC 02/23/2007  . Type II diabetes mellitus   . Diabetic gastroparesis     /e-chart   Past Surgical History  Procedure Laterality Date  . Port-a-cath placement  ~ 2008    right chest; "poor access; frequent hospitalizations"  . Laparotomy and lysis of adhesions    . Abdominal hysterectomy  02/2002  . Cholecystectomy  1980's  . Cardiac catheterization    . Dilation and evacuation  X3  . Left hand surgery    . Cataract extraction w/ intraocular lens  implant, bilateral    . Colonoscopy    . Upper gi endoscopy     Family History  Problem Relation Age of Onset  . Diabetes type II Mother   . Diabetes Mother   . Hypertension Mother   . Diabetes type II Sister   .  Diabetes Sister   . Hypertension Sister   . Hypertension Brother    History  Substance Use Topics  . Smoking status: Current Some Day Smoker -- 0.50 packs/day for 22 years    Types: Cigarettes    Last Attempt to Quit: 02/22/2012  . Smokeless tobacco: Never Used  . Alcohol Use: No   OB History   Grav Para Term Preterm Abortions TAB SAB Ect Mult Living                 Review of Systems 10 Systems reviewed and are negative for acute change except as noted in the HPI.  Allergies  Bee venom; Acetaminophen; Novolog; and Erythromycin  Home Medications   Current Outpatient Rx  Name  Route  Sig  Dispense  Refill  . aspirin EC 81 MG tablet   Oral   Take 81 mg by mouth every morning.         . carvedilol (COREG) 25 MG tablet   Oral   Take 25 mg by mouth 2 (two) times daily with a meal.         . cephALEXin (KEFLEX) 500 MG capsule   Oral   Take 500 mg by mouth 2 (two) times daily.         . diclofenac  sodium (VOLTAREN) 1 % GEL   Topical   Apply 2 g topically 4 (four) times daily as needed (pain).         Marland Kitchen EPINEPHrine (EPIPEN) 0.3 mg/0.3 mL DEVI   Intramuscular   Inject 0.3 mg into the muscle once as needed (for allegic reactions).         . insulin glargine (LANTUS) 100 UNIT/ML injection   Subcutaneous   Inject 20 Units into the skin every morning.          . lidocaine (LIDODERM) 5 %   Transdermal   Place 1 patch onto the skin daily as needed (pain). Remove & Discard patch within 12 hours or as directed by MD         . lisinopril (PRINIVIL,ZESTRIL) 40 MG tablet   Oral   Take 40 mg by mouth every morning.         Marland Kitchen LORazepam (ATIVAN) 1 MG tablet   Oral   Take 1 mg by mouth every 8 (eight) hours as needed for anxiety.         . promethazine (PHENERGAN) 25 MG tablet   Oral   Take 25 mg by mouth every 6 (six) hours as needed for nausea.         . SUMAtriptan (IMITREX) 50 MG tablet   Oral   Take 50 mg by mouth every 2 (two) hours as needed for  migraine.           BP 163/112  Pulse 126  Temp(Src) 98.6 F (37 C) (Oral)  Resp 22  SpO2 97% Physical Exam  Nursing note and vitals reviewed. Constitutional: She is oriented to person, place, and time. She appears well-developed and well-nourished. She appears distressed.  HENT:  Head: Normocephalic and atraumatic.  Nose: Nose normal.  Mouth/Throat: Oropharynx is clear and moist. No oropharyngeal exudate.  Eyes: Conjunctivae and EOM are normal. Pupils are equal, round, and reactive to light. No scleral icterus.  Neck: Normal range of motion. Neck supple. No JVD present. No tracheal deviation present. No thyromegaly present.  Cardiovascular: Regular rhythm.  Exam reveals no gallop and no friction rub.   No murmur heard. Tachycardia present  Pulmonary/Chest: Effort normal and breath sounds normal. No respiratory distress. She has no wheezes. She exhibits no tenderness.  Abdominal: Soft. Bowel sounds are normal. She exhibits no distension and no mass. There is no tenderness. There is no rebound and no guarding.  Musculoskeletal: Normal range of motion. She exhibits no edema and no tenderness.  Diffuse C,T,L spine tenderness present, no step offs palpated  Lymphadenopathy:    She has no cervical adenopathy.  Neurological: She is alert and oriented to person, place, and time. She has normal strength. No cranial nerve deficit or sensory deficit. Coordination normal. GCS eye subscore is 4. GCS verbal subscore is 5. GCS motor subscore is 6.  Skin: Skin is warm and dry. No rash noted. No erythema. No pallor.    ED Course  Procedures (including critical care time) Labs Reviewed  COMPREHENSIVE METABOLIC PANEL - Abnormal; Notable for the following:    Glucose, Bld 364 (*)    BUN 27 (*)    Creatinine, Ser 2.10 (*)    GFR calc non Af Amer 27 (*)    GFR calc Af Amer 31 (*)    All other components within normal limits  CBC WITH DIFFERENTIAL - Abnormal; Notable for the following:    HCT  35.4 (*)    All other components  within normal limits  AMYLASE - Abnormal; Notable for the following:    Amylase 173 (*)    All other components within normal limits  URINALYSIS, ROUTINE W REFLEX MICROSCOPIC - Abnormal; Notable for the following:    Color, Urine AMBER (*)    APPearance TURBID (*)    Glucose, UA >1000 (*)    Hgb urine dipstick SMALL (*)    Ketones, ur 15 (*)    Protein, ur 100 (*)    Leukocytes, UA MODERATE (*)    All other components within normal limits  URINE RAPID DRUG SCREEN (HOSP PERFORMED) - Abnormal; Notable for the following:    Tetrahydrocannabinol POSITIVE (*)    All other components within normal limits  URINE MICROSCOPIC-ADD ON - Abnormal; Notable for the following:    Squamous Epithelial / LPF MANY (*)    Bacteria, UA MANY (*)    All other components within normal limits  URINE CULTURE  LIPASE, BLOOD  LACTIC ACID, PLASMA  POCT I-STAT TROPONIN I  POCT PREGNANCY, URINE   Dg Chest Portable 1 View  07/07/2013   *RADIOLOGY REPORT*  Clinical Data: Abdominal pain, nausea and vomiting.  PORTABLE CHEST - 1 VIEW  Comparison: 07/05/2013.  Findings: Stable right subclavian porta catheter.  The heart remains normal in size and the lungs are clear.  Normal appearing bones.  IMPRESSION: No acute abnormality.   Original Report Authenticated By: Claudie Revering, M.D.   No diagnosis found.  Date: 07/07/2013  Rate: 117  Rhythm: sinus tachycardia  QRS Axis: normal  Intervals: normal  ST/T Wave abnormalities: nonspecific ST changes  Conduction Disutrbances:none  Narrative Interpretation: atrial enlargement  Old EKG Reviewed: none available   MDM  Patient was treated here with her usual phenergan, protonix, and dilaudid therapy.  I do have some concerns for drug seeking behavior in this patient.  She will be provided with a second dose of medications if needed.  Patient will be sent home with a short course of pain medication.  Previous clinic notes rec no more 5 tabs to  be dispensed, so she will get 5 tabs of oxycodone for home relief.  Patient is amendable to this plan.   Everlene Balls, MD 07/07/13 2320

## 2013-07-07 NOTE — ED Notes (Signed)
Pt c/o epigastric pain with N/V, diaphoresis, that radiates subdermally.

## 2013-07-07 NOTE — ED Provider Notes (Signed)
I evaluated the patient in conjunction with the resident physician.  I saw the relevant studies, including the ecg (as needed) and agree with the interpretation. The documentation is accurate with the following additional / clarifications:  Patient with chronic abdominal pain, who I had prior evaluation of now presents with abdominal pain. Pain improved substantially here, and absent notable new features, she was discharged in stable condition.  Carmin Muskrat, MD 07/07/13 [phone]

## 2013-07-08 MED ORDER — HEPARIN SOD (PORK) LOCK FLUSH 100 UNIT/ML IV SOLN
500.0000 [IU] | Freq: Once | INTRAVENOUS | Status: AC
  Administered 2013-07-08: 500 [IU] via INTRAVENOUS

## 2013-07-09 LAB — URINE CULTURE

## 2013-07-10 ENCOUNTER — Telehealth: Payer: Self-pay | Admitting: Cardiology

## 2013-07-10 ENCOUNTER — Telehealth: Payer: Self-pay | Admitting: Family Medicine

## 2013-07-10 NOTE — Telephone Encounter (Signed)
Discussed with patient.  Two issues: 1. Upset that she was [facility] from ER when she felt she needed to be admitted.  Reviewed notes.  In future, I suggested that she ask to be evaluated by one of the FM residents, not just the ER doc, because, yes, we know her much better. 2. Continued symptoms.  Not vomiting but also not eating.  Has appointment with [place] in two days.  I would like her seen sooner to check weight and creat.  (I am a bit worried about dehydration since her ER creat was 2.0 and baseline seems to be 1.6 -1.7. Will have her seen sooner as an SDA.  Also, advised her that she must quit smoking pot.  It is making her cyclic vomiting worse and it is making her be judged (ER note says concerned about drug seeking behavior.  I am less concerned and more concerned about her truly feeling miserable.)

## 2013-07-10 NOTE — Telephone Encounter (Signed)
Patient would like to speak to Dr. Andria Frames about the treatment from the residents.  She has Cyclic Vomiting Syndrome.  She went to the ER on Wednesday and Saturday with severe vomiting begging to be admitted but they refused to admit her.  She would like to speak to Dr. Andria Frames today.

## 2013-07-10 NOTE — Telephone Encounter (Signed)
Patient returned call to Dr. Andria Frames.

## 2013-07-10 NOTE — Telephone Encounter (Signed)
LEFT MESSAGE FOR PATIENT TO CALL AND SCHEDULE TESTING ORDERED BY Mickel Baas

## 2013-07-11 ENCOUNTER — Ambulatory Visit (INDEPENDENT_AMBULATORY_CARE_PROVIDER_SITE_OTHER): Payer: Medicare Other | Admitting: Family Medicine

## 2013-07-11 ENCOUNTER — Encounter: Payer: Self-pay | Admitting: Family Medicine

## 2013-07-11 VITALS — BP 129/83 | HR 112 | Temp 98.1°F | Ht 61.5 in | Wt 146.0 lb

## 2013-07-11 DIAGNOSIS — R112 Nausea with vomiting, unspecified: Secondary | ICD-10-CM

## 2013-07-11 DIAGNOSIS — R1013 Epigastric pain: Secondary | ICD-10-CM | POA: Diagnosis not present

## 2013-07-11 DIAGNOSIS — G8929 Other chronic pain: Secondary | ICD-10-CM

## 2013-07-11 DIAGNOSIS — R1115 Cyclical vomiting syndrome unrelated to migraine: Secondary | ICD-10-CM | POA: Diagnosis not present

## 2013-07-11 LAB — COMPREHENSIVE METABOLIC PANEL
ALT: 12 U/L (ref 0–35)
AST: 21 U/L (ref 0–37)
Alkaline Phosphatase: 102 U/L (ref 39–117)
BUN: 43 mg/dL — ABNORMAL HIGH (ref 6–23)
Calcium: 10.3 mg/dL (ref 8.4–10.5)
Chloride: 87 mEq/L — ABNORMAL LOW (ref 96–112)
Creat: 2.63 mg/dL — ABNORMAL HIGH (ref 0.50–1.10)
Potassium: 3.6 mEq/L (ref 3.5–5.3)

## 2013-07-11 MED ORDER — PROMETHAZINE HCL 25 MG PO TABS: 25.0000 mg | ORAL_TABLET | Freq: Four times a day (QID) | ORAL | Status: DC | PRN

## 2013-07-11 MED ORDER — LORAZEPAM 1 MG PO TABS: 1.0000 mg | ORAL_TABLET | Freq: Three times a day (TID) | ORAL | Status: DC | PRN

## 2013-07-11 MED ORDER — OXYCODONE HCL 5 MG PO TABS: 5.0000 mg | ORAL_TABLET | Freq: Three times a day (TID) | ORAL | Status: DC | PRN

## 2013-07-11 NOTE — Progress Notes (Signed)
  [name] Garret Reddish, MD Phone: [phone]  Subjective:   # Cyclic Vomiting Syndrome Patient presents for follow up of one of her typical episodes of cyclic vomiting. Fortunately, she has had no emesis over the last 24 hours. No evidence of blood or bile . Still with moderate epigastric abdominal pain. No fevers/chills. Has been seen in ED twice over last week. She felt like she should have been admitted previously but states she is feeling better now. Drinking water (significant amount over last day) and advanced to icy pops. Ran out of phenergan and ativan and oxycodone last night and as she is still nauseous and with some epigastric pain, requests a refill. Negative lipase on 7/12.   Spoke with Dr. Andria Frames by phone and plan for today was repeat evaluation of kidney function as trending up slightly.   ROS--See HPI  Past Medical History-marijuana abuse, diabetes uncontrolled, diabetic neuropathy, hypertension Reviewed problem list.  Medications- reviewed and updated Chief complaint-noted  Objective: BP 129/83  Pulse 112  Temp(Src) 98.1 F (36.7 C) (Oral)  Ht 5' 1.5" (1.562 m)  Wt 146 lb (66.225 kg)  BMI 27.14 kg/m2 Gen: NAD, resting comfortably on table majority of visit, but when discussing chronic illness appears sligthly uncomfortable and becomes tearful HEENT: mildy dry tongue and mucus membranes CV: slightly tachy to low 100s. no murmurs rubs or gallops Lungs: CTAB no crackles, wheeze, rhonchi Skin: warm, dry Neuro: grossly normal, moves all extremities  Assessment/Plan:

## 2013-07-11 NOTE — Assessment & Plan Note (Addendum)
Appears acute episode is improving. Down 14 lbs and Cr had been trending up. Offered patient IVF in office but she is feeling better and states she thinks she can continue to drink fluids. CMET today to follow up Cr and for epigastric pain.   Refilled ativan, phenergan, and oxycodone. Follow up on Friday.   Addendum: Creatinine came back at 2.6 up from new baseline which appears to be 1.8-2 since May of this year. Called patient and advised her to stop lisinopril and voltaren gel. She asked me about UTI she was diagnosed with and urine culture showed 20,000 mixed so told her to stop Keflex as well. Plan will be to repeat Cr on Friday and push hydration as much as possible.

## 2013-07-11 NOTE — Patient Instructions (Addendum)
Please come see [place] on Friday so we can check on your weight and see how you are doing. I will call you if you need to do anything differently today based off your labs. Great job not smoking marijuana in 7 days! Keep up the great work and I beelieve you will see the benefit. I have refilled your ativan and phenergan and oxycodone.   Thanks, Dr. Yong Channel  Please also see Dr. Darene Lamer to discuss the following in the next few weeks: Health Maintenance Due  Topic Date Due  . Foot Exam  02/13/2013  . Ophthalmology Exam  03/02/2013  . Tetanus/tdap  06/26/2013

## 2013-07-12 ENCOUNTER — Ambulatory Visit: Payer: Medicare Other | Admitting: Family Medicine

## 2013-07-13 ENCOUNTER — Ambulatory Visit (INDEPENDENT_AMBULATORY_CARE_PROVIDER_SITE_OTHER): Payer: Medicare Other | Admitting: Family Medicine

## 2013-07-13 ENCOUNTER — Encounter: Payer: Self-pay | Admitting: Family Medicine

## 2013-07-13 VITALS — BP 132/75 | HR 88 | Temp 99.1°F | Ht 61.25 in | Wt 151.0 lb

## 2013-07-13 DIAGNOSIS — J020 Streptococcal pharyngitis: Secondary | ICD-10-CM

## 2013-07-13 DIAGNOSIS — N898 Other specified noninflammatory disorders of vagina: Secondary | ICD-10-CM | POA: Diagnosis not present

## 2013-07-13 DIAGNOSIS — A599 Trichomoniasis, unspecified: Secondary | ICD-10-CM | POA: Insufficient documentation

## 2013-07-13 DIAGNOSIS — Z7251 High risk heterosexual behavior: Secondary | ICD-10-CM

## 2013-07-13 DIAGNOSIS — N179 Acute kidney failure, unspecified: Secondary | ICD-10-CM

## 2013-07-13 LAB — POCT WET PREP (WET MOUNT)
Clue Cells Wet Prep Whiff POC: POSITIVE
WBC, Wet Prep HPF POC: >20

## 2013-07-13 MED ORDER — PENICILLIN V POTASSIUM 500 MG PO TABS: 500.0000 mg | ORAL_TABLET | Freq: Three times a day (TID) | ORAL | Status: DC

## 2013-07-13 MED ORDER — PENICILLIN G BENZATHINE 1200000 UNIT/2ML IM SUSP
1.2000 10*6.[IU] | Freq: Once | INTRAMUSCULAR | Status: AC
  Administered 2013-07-13: 1.2 10*6.[IU] via INTRAMUSCULAR

## 2013-07-13 MED ORDER — METRONIDAZOLE 500 MG PO TABS: 1000.0000 mg | ORAL_TABLET | Freq: Two times a day (BID) | ORAL | Status: DC

## 2013-07-13 NOTE — Patient Instructions (Addendum)
. Strep Throat Strep throat is an infection of the throat caused by a bacteria named Streptococcus pyogenes. Your caregiver may call the infection streptococcal "tonsillitis" or "pharyngitis" depending on whether there are signs of inflammation in the tonsils or back of the throat. Strep throat is most common in children aged 48 15 years during the cold months of the year, but it can occur in people of any age during any season. This infection is spread from person to person (contagious) through coughing, sneezing, or other close contact. SYMPTOMS   Fever or chills.  Painful, swollen, red tonsils or throat.  Pain or difficulty when swallowing.  White or yellow spots on the tonsils or throat.  Swollen, tender lymph nodes or "glands" of the neck or under the jaw.  Red rash all over the body (rare). DIAGNOSIS  Many different infections can cause the same symptoms. A test must be done to confirm the diagnosis so the right treatment can be given. A "rapid strep test" can help your caregiver make the diagnosis in a few minutes. If this test is not available, a light swab of the infected area can be used for a throat culture test. If a throat culture test is done, results are usually available in a day or two. TREATMENT  Strep throat is treated with antibiotic medicine. HOME CARE INSTRUCTIONS   Gargle with 1 tsp of salt in 1 cup of warm water, 3 4 times per day or as needed for comfort.  Family members who also have a sore throat or fever should be tested for strep throat and treated with antibiotics if they have the strep infection.  Make sure everyone in your household washes their hands well.  Do not share food, drinking cups, or personal items that could cause the infection to spread to others.  You may need to eat a soft food diet until your sore throat gets better.  Drink enough water and fluids to keep your urine clear or pale yellow. This will help prevent dehydration.  Get plenty of  rest.  Stay home from school, daycare, or work until you have been on antibiotics for 24 hours.  Only take over-the-counter or prescription medicines for pain, discomfort, or fever as directed by your caregiver.  If antibiotics are prescribed, take them as directed. Finish them even if you start to feel better. SEEK MEDICAL CARE IF:   The glands in your neck continue to enlarge.  You develop a rash, cough, or earache.  You cough up green, yellow-brown, or bloody sputum.  You have pain or discomfort not controlled by medicines.  Your problems seem to be getting worse rather than better. SEEK IMMEDIATE MEDICAL CARE IF:   You develop any new symptoms such as vomiting, severe headache, stiff or painful neck, chest pain, shortness of breath, or trouble swallowing.  You develop severe throat pain, drooling, or changes in your voice.  You develop swelling of the neck, or the skin on the neck becomes red and tender.  You have a fever.  You develop signs of dehydration, such as fatigue, dry mouth, and decreased urination.  You become increasingly sleepy, or you cannot wake up completely. Document Released: 12/10/2000 Document Revised: 11/29/2012 Document Reviewed: 02/11/2011 [facility] Patient Information 2014 [place], [place].  Trichomoniasis Trichomoniasis is an infection, caused by the Trichomonas organism, that affects both women and men. In women, the outer female genitalia and the vagina are affected. In men, the penis is mainly affected, but the prostate and other  reproductive organs can also be involved. Trichomoniasis is a sexually transmitted disease (STD) and is most often passed to another person through sexual contact. The majority of people who get trichomoniasis do so from a sexual encounter and are also at risk for other STDs. CAUSES   Sexual intercourse with an infected partner.  It can be present in swimming pools or hot tubs. SYMPTOMS   Abnormal gray-green frothy  vaginal discharge in women.  Vaginal itching and irritation in women.  Itching and irritation of the area outside the vagina in women.  Penile discharge with or without pain in males.  Inflammation of the urethra (urethritis), causing painful urination.  Bleeding after sexual intercourse. RELATED COMPLICATIONS  Pelvic inflammatory disease.  Infection of the uterus (endometritis).  Infertility.  Tubal (ectopic) pregnancy.  It can be associated with other STDs, including gonorrhea and chlamydia, hepatitis B, and HIV. COMPLICATIONS DURING PREGNANCY  Early (premature) delivery.  Premature rupture of the membranes (PROM).  Low birth weight. DIAGNOSIS   Visualization of Trichomonas under the microscope from the vagina discharge.  Ph of the vagina greater than 4.5, tested with a test tape.  Trich Rapid Test.  Culture of the organism, but this is not usually needed.  It may be found on a Pap test.  Having a "strawberry cervix,"which means the cervix looks very red like a strawberry. TREATMENT   You may be given medication to fight the infection. Inform your caregiver if you could be or are pregnant. Some medications used to treat the infection should not be taken during pregnancy.  Over-the-counter medications or creams to decrease itching or irritation may be recommended.  Your sexual partner will need to be treated if infected. HOME CARE INSTRUCTIONS   Take all medication prescribed by your caregiver.  Take over-the-counter medication for itching or irritation as directed by your caregiver.  Do not have sexual intercourse while you have the infection.  Do not douche or wear tampons.  Discuss your infection with your partner, as your partner may have acquired the infection from you. Or, your partner may have been the person who transmitted the infection to you.  Have your sex partner examined and treated if necessary.  Practice safe, informed, and protected  sex.  See your caregiver for other STD testing. SEEK MEDICAL CARE IF:   You still have symptoms after you finish the medication.  You have an oral temperature above 102 F (38.9 C).  You develop belly (abdominal) pain.  You have pain when you urinate.  You have bleeding after sexual intercourse.  You develop a rash.  The medication makes you sick or makes you throw up (vomit). Document Released: 06/08/2001 Document Revised: 03/06/2012 Document Reviewed: 07/04/2009 [facility] Patient Information 2014 [place], [place].

## 2013-07-13 NOTE — Assessment & Plan Note (Signed)
Vaginal discharge with wet prep positive for Trichomonas, yeast and BV. Since pt just came off one of her cyclic vomiting episodes with elevated Cr. We only prescribed treatment for Trich, leaving yeast and BV for her next week appointment. Instructed that is is a STD and her partner needs to be treated as well as not sex for 7 days after completing treatment.

## 2013-07-13 NOTE — Progress Notes (Signed)
Family Medicine Office Visit Note   Subjective:   Patient ID: [name] [name], female  DOB: 11/15/1966, 47 y.o.. MRN: [id number]   Pt that comes today for same day appointment complaining of mouth and sore throat as well as vaginal discharge. She was seen last Wednesday and the concern was her raising Creatinine.  #1. Mouth and sore throat: report  She feels a R side a bump in her gum that is coming up noticed this am as well as sore throat when she swallows.  She has been without smoking for 7 days and this morning she started back. Denies fevers or chills, cough or upper respiratory symptoms.  #2. Vaginal discharge: started itching yesterday, she took a shower and put soap in it and now is sore and continues to be itchy. She noticed this morning white curd-like vaginal discharge and foul smell. [place] sexual intercourse unprotected 3 weeks ago.   #3. Elevation on Creatinine f/u: Pt reports Dr. Yong Channel called her with Cr results and since then she has been keeping herself hydrated. Denies any other episodes of vomiting or nausea since last time she was seen.   Review of Systems Per HPI  Objective:   Physical Exam: Gen:  NAD HEENT: Moist mucous membranes. Oropharynx: erythematous and with exudates. Neck: bilateral anterior adenopathies. Right adenopathy tender to palpation.  CV: Regular rate and rhythm, no murmurs rubs or gallops PULM: Clear to auscultation bilaterally. No wheezes/rales/rhonchi ABD: Soft, non tender, non distended, normal bowel sounds EXT: No edema Neuro: Alert and oriented x3. No focalization Vulva and perianal area: Normal  Speculum: Vagina and cervix of normal appearance, no friability, moderated grayish discharge. Bimanual exam: Uterus anteverted, no adnexal masses. No cervical motion tenderness.  Assessment & Plan:   Individually addressed in  problem list plus, Pt Creatinine was elevated on 4/16. She has been asymptomatic from her cyclic vomiting. We discussed with  Attending and decision of rechecking Cr will be better next week. A future Bmet order has been placed for this purpose.  Pt was positive for Trichomonas, yeast and BV. We only treated STD today and recommended wait pt's response to oral Metronidazole before treating with Fluconazole. (we did not want to overwhelm her sensitive GI tract) She should be screened for HIV, RPR and Gc/CT. I have also put this orders as future to get drawn next week with her BMET.

## 2013-07-13 NOTE — Assessment & Plan Note (Addendum)
No fever, but pain, anterior cervical adenopathies and rapid strep positive.  P/ Single dose of Bicillin

## 2013-07-18 ENCOUNTER — Ambulatory Visit (HOSPITAL_COMMUNITY): Payer: Medicare Other

## 2013-07-23 ENCOUNTER — Encounter (HOSPITAL_COMMUNITY): Payer: Medicare Other

## 2013-07-24 ENCOUNTER — Telehealth: Payer: Self-pay | Admitting: Family Medicine

## 2013-07-24 NOTE — Telephone Encounter (Signed)
Returned call to pt. Advised that appointment would have to be made if she would like prescription meds. Pt describes URI like symptoms - coughing, nasal congestion, drainage and itching. Advised that sugar free Robitussin OTC, MUcinex, Coricidin HBP were OTC suggestions, encourage fluids. Pt reported that she was unable to get any OTC meds and will come in for appointment in the morning.  Tildon Husky, RN-BSN

## 2013-07-24 NOTE — Telephone Encounter (Signed)
Patient is calling because she has a cough/cold and with her disease with vomiting and she is concerned that she might trigger an episode.  With have HTN and DM she isn't sure what otc she can take so she is asking for something to be called in.  This cough/cold is in her chest and head, she also has itching in her chest, throat and ear.

## 2013-07-25 ENCOUNTER — Ambulatory Visit (INDEPENDENT_AMBULATORY_CARE_PROVIDER_SITE_OTHER): Payer: Medicare Other | Admitting: Sports Medicine

## 2013-07-25 VITALS — BP 100/61 | HR 64 | Temp 98.3°F | Wt 154.0 lb

## 2013-07-25 DIAGNOSIS — J069 Acute upper respiratory infection, unspecified: Secondary | ICD-10-CM

## 2013-07-25 MED ORDER — TETANUS-DIPHTH-ACELL PERTUSSIS 5-2.5-18.5 LF-MCG/0.5 IM SUSP: 0.5000 mL | Freq: Once | INTRAMUSCULAR | Status: DC

## 2013-07-25 MED ORDER — DOXYCYCLINE HYCLATE 100 MG PO TABS: 100.0000 mg | ORAL_TABLET | Freq: Two times a day (BID) | ORAL | Status: DC

## 2013-07-25 MED ORDER — HYDROCODONE-HOMATROPINE 5-1.5 MG/5ML PO SYRP: 5.0000 mL | ORAL_SOLUTION | Freq: Four times a day (QID) | ORAL | Status: DC | PRN

## 2013-07-25 NOTE — Patient Instructions (Addendum)
It was nice to see you today.   Today we discussed: 1. Viral URI with cough I have sent in a cough syrup and antibiotic to help with the inflammation - HYDROcodone-homatropine (HYCODAN) 5-1.5 MG/5ML syrup; Take 5 mLs by mouth every 6 (six) hours as needed for cough.  Dispense: 120 mL; Refill: 0 - doxycycline (VIBRA-TABS) 100 MG tablet; Take 1 tablet (100 mg total) by mouth 2 (two) times daily.  Dispense: 10 tablet; Refill: 0   Please plan to return to see Dr. Dianah Field in 2 weeks for the TDAP; I have given you a prescription please fill it before you see Dr. Darene Lamer and bring it with you to your appointment.  If you need anything prior to seeing me please call the clinic.  Please Bring all medications with you to each appointment.

## 2013-07-25 NOTE — Assessment & Plan Note (Addendum)
No focal infection but Diffuse upper airway irritation on exam Lungs CTA - defer CXR given afebrile and no hypoxia Given cough suppressant due to concerns for triggering cyclic vomiting syndrome Given the presence of URI symptoms deferred strep test. Doxycycline for anti-inflammatory effect given cannot use Tylenol or NSAIDS Return precautions given

## 2013-07-25 NOTE — Progress Notes (Signed)
  [facility]  Patient name: [name] [name] MRN [id number]  Date of birth: 09-22-1966  CC & HPI:  [name] [name] is a 47 y.o. female presenting today for:  # Acute RESPIRATORY Symptoms: Major Sxs:  Cough, dyspnea, congestion, rhinorrhea, ear pressure  Character  productive cough, yelloish  Onset  2 days  Fevers/Chills  yes, no objective but chills  Rigors  no  N/V  yes, feels like it is going to trigger her cyclic vomiting syndrome  Diarrhea  no  Weight Loss  no  Sick Contacts  yes, daughter and whole family  Other  no foreign travel,  Therapy Tried  taking hyroxyzine    ROS:  PER HPI  Pertinent History Reviewed:  Medical & Surgical Hx:  Reviewed: Significant for cyclic vomiting syndrome Medications: Reviewed & Updated - see associated section Social History: Reviewed -  reports that she has been smoking Cigarettes.  She has a 11 pack-year smoking history. She has never used smokeless tobacco.  Objective Findings:  Vitals: BP 100/61  Pulse 64  Temp(Src) 98.3 F (36.8 C) (Oral)  Wt 154 lb (69.854 kg)  BMI 28.85 kg/m2  SpO2 98%  PE: GENERAL:  adult [place] American female. In no discomfort; no respiratory distress  PSYCH:  alert and appropriate, good insight   HNEENT:  H&N: AT/[place], trachea midline  Eyes: no scleral icterus, no conjunctival exudate  Ears:  bilateral no tympanic erythema with bilateral of slight effusion   Nose:  diffuse nasal mucosa edema and erythema with nasal discharge   Oropharynx: MMM, no tonsillar exudate however there is tonsillar hypertrophy and streaky erythema.    Dentention:     CARDIO:  RRR, S1/S2 heard, no murmur  LUNGS:  CTA B, no wheezes, no crackles  ABDOMEN:   hyperactive bowel sounds, abdomen is diffusely tender but nonfocal.  There are no masses   EXTREM:  warm well perfused, good capillary refill, no skin tenting.   No edema   GU:   SKIN:   NEUROMSK:      Assessment & Plan:

## 2013-07-27 ENCOUNTER — Telehealth: Payer: Self-pay | Admitting: Family Medicine

## 2013-07-27 DIAGNOSIS — G8929 Other chronic pain: Secondary | ICD-10-CM

## 2013-07-27 DIAGNOSIS — R112 Nausea with vomiting, unspecified: Secondary | ICD-10-CM

## 2013-07-27 NOTE — Telephone Encounter (Signed)
Pt is requesting that Dr. Dianah Field refill her lorazepam be called in to her pharmacy on file. JW

## 2013-07-27 NOTE — Telephone Encounter (Signed)
Will fwd to MD.  Samentha Perham L, CMA  

## 2013-07-28 MED ORDER — LORAZEPAM 1 MG PO TABS: 1.0000 mg | ORAL_TABLET | Freq: Three times a day (TID) | ORAL | Status: DC | PRN

## 2013-07-28 NOTE — Telephone Encounter (Signed)
Pt called the emergency line because she is out of ativan. She has cyclic vomiting and is having symptoms that phenergan alone is not helping . She last had a refill about 2 weeks ago being given 30 then so has used an avg of 2 per day.   It appears that this is an established problem causing frequent ED trips and hospitalizations. I explained that we dod not give any prescriptions via the emergency line but that because I can see that she has regular follow up and symptoms not helped by her phenergan I would give her enough ativan to get her through Monday. I explained that she needs to call and get a follow up appt with her PCP on Monday.   Ativan 1 mg TID PRN #10 with R0 called in to her [facility].   Laroy Apple, MD [place] Resident, PGY-2 07/28/2013, 1:02 PM

## 2013-07-28 NOTE — Telephone Encounter (Signed)
Being addressed by physician on call after discussion with me. I have met patient once in hospital. Per Dr. Marvene Staff notes, she uses short course of scheduled medications including ativan when she is getting into a cyclic vomiting flare and has current complaint of nausea. I feel it is appropriate to provide short-course of small amount of ativan and urge patient to follow up for this in clinic frequently.

## 2013-07-31 ENCOUNTER — Ambulatory Visit (HOSPITAL_COMMUNITY)
Admission: RE | Admit: 2013-07-31 | Discharge: 2013-07-31 | Disposition: A | Discharge status: Home / Self Care | Payer: Medicare Other | Source: Ambulatory Visit | Attending: Cardiology | Admitting: Cardiology

## 2013-07-31 ENCOUNTER — Ambulatory Visit (HOSPITAL_BASED_OUTPATIENT_CLINIC_OR_DEPARTMENT_OTHER)
Admission: RE | Admit: 2013-07-31 | Discharge: 2013-07-31 | Disposition: A | Discharge status: Home / Self Care | Payer: Medicare Other | Source: Ambulatory Visit | Attending: Cardiology | Admitting: Cardiology

## 2013-07-31 DIAGNOSIS — I10 Essential (primary) hypertension: Secondary | ICD-10-CM

## 2013-07-31 DIAGNOSIS — E119 Type 2 diabetes mellitus without complications: Secondary | ICD-10-CM

## 2013-07-31 DIAGNOSIS — F17200 Nicotine dependence, unspecified, uncomplicated: Secondary | ICD-10-CM | POA: Insufficient documentation

## 2013-07-31 DIAGNOSIS — I129 Hypertensive chronic kidney disease with stage 1 through stage 4 chronic kidney disease, or unspecified chronic kidney disease: Secondary | ICD-10-CM | POA: Insufficient documentation

## 2013-07-31 DIAGNOSIS — N189 Chronic kidney disease, unspecified: Secondary | ICD-10-CM | POA: Diagnosis not present

## 2013-07-31 DIAGNOSIS — R079 Chest pain, unspecified: Secondary | ICD-10-CM

## 2013-07-31 NOTE — Progress Notes (Signed)
Renal Duplex Completed. Michaeleen Down, BS, RDMS, RVT  

## 2013-07-31 NOTE — Progress Notes (Signed)
[facility]   2D echo completed 07/31/2013.   Jamison Neighbor, RDCS

## 2013-08-01 ENCOUNTER — Telehealth (HOSPITAL_COMMUNITY): Payer: Self-pay | Admitting: *Deleted

## 2013-08-03 ENCOUNTER — Telehealth: Payer: Self-pay | Admitting: *Deleted

## 2013-08-03 NOTE — Telephone Encounter (Signed)
Results given.verbailzed understanding. Pt is aware that she needs an appointment  After myoview is completed. She is driving at the present time

## 2013-08-03 NOTE — Telephone Encounter (Signed)
Message copied by Raiford Simmonds on Fri Aug 03, 2013  1:56 PM ------      Message from: [name] [name]      Created: Thu Aug 02, 2013  4:36 PM       Pt to follow up with Dr. Ellyn Hack but normal renal artery doppler study.  Normal blood flow to the kidneys. ------

## 2013-08-05 ENCOUNTER — Encounter: Payer: Self-pay | Admitting: *Deleted

## 2013-08-07 ENCOUNTER — Ambulatory Visit: Payer: Medicare Other | Admitting: Family Medicine

## 2013-08-09 ENCOUNTER — Encounter (HOSPITAL_COMMUNITY): Payer: Medicare Other

## 2013-08-16 ENCOUNTER — Encounter (HOSPITAL_COMMUNITY): Payer: Self-pay | Admitting: Emergency Medicine

## 2013-08-16 ENCOUNTER — Encounter (HOSPITAL_COMMUNITY): Payer: Medicare Other

## 2013-08-16 ENCOUNTER — Inpatient Hospital Stay (HOSPITAL_COMMUNITY)
Admission: EM | Admit: 2013-08-16 | Discharge: 2013-08-20 | DRG: 439 | Disposition: A | Discharge status: Home / Self Care | Payer: Medicare Other | Attending: Family Medicine | Admitting: Family Medicine

## 2013-08-16 ENCOUNTER — Emergency Department (HOSPITAL_COMMUNITY): Payer: Medicare Other

## 2013-08-16 DIAGNOSIS — F1210 Cannabis abuse, uncomplicated: Secondary | ICD-10-CM | POA: Diagnosis present

## 2013-08-16 DIAGNOSIS — E871 Hypo-osmolality and hyponatremia: Secondary | ICD-10-CM | POA: Diagnosis not present

## 2013-08-16 DIAGNOSIS — E1142 Type 2 diabetes mellitus with diabetic polyneuropathy: Secondary | ICD-10-CM | POA: Diagnosis present

## 2013-08-16 DIAGNOSIS — E1169 Type 2 diabetes mellitus with other specified complication: Secondary | ICD-10-CM | POA: Diagnosis present

## 2013-08-16 DIAGNOSIS — E1322 Other specified diabetes mellitus with diabetic chronic kidney disease: Secondary | ICD-10-CM | POA: Diagnosis present

## 2013-08-16 DIAGNOSIS — R1013 Epigastric pain: Secondary | ICD-10-CM | POA: Diagnosis not present

## 2013-08-16 DIAGNOSIS — F1290 Cannabis use, unspecified, uncomplicated: Secondary | ICD-10-CM | POA: Diagnosis present

## 2013-08-16 DIAGNOSIS — I129 Hypertensive chronic kidney disease with stage 1 through stage 4 chronic kidney disease, or unspecified chronic kidney disease: Secondary | ICD-10-CM | POA: Diagnosis present

## 2013-08-16 DIAGNOSIS — B373 Candidiasis of vulva and vagina: Secondary | ICD-10-CM | POA: Diagnosis present

## 2013-08-16 DIAGNOSIS — N179 Acute kidney failure, unspecified: Secondary | ICD-10-CM | POA: Diagnosis present

## 2013-08-16 DIAGNOSIS — R112 Nausea with vomiting, unspecified: Secondary | ICD-10-CM

## 2013-08-16 DIAGNOSIS — F330 Major depressive disorder, recurrent, mild: Secondary | ICD-10-CM | POA: Diagnosis present

## 2013-08-16 DIAGNOSIS — Z91199 Patient's noncompliance with other medical treatment and regimen due to unspecified reason: Secondary | ICD-10-CM

## 2013-08-16 DIAGNOSIS — R1110 Vomiting, unspecified: Secondary | ICD-10-CM

## 2013-08-16 DIAGNOSIS — E876 Hypokalemia: Secondary | ICD-10-CM

## 2013-08-16 DIAGNOSIS — R1115 Cyclical vomiting syndrome unrelated to migraine: Secondary | ICD-10-CM

## 2013-08-16 DIAGNOSIS — Z79899 Other long term (current) drug therapy: Secondary | ICD-10-CM

## 2013-08-16 DIAGNOSIS — K859 Acute pancreatitis without necrosis or infection, unspecified: Principal | ICD-10-CM

## 2013-08-16 DIAGNOSIS — R109 Unspecified abdominal pain: Secondary | ICD-10-CM

## 2013-08-16 DIAGNOSIS — E1140 Type 2 diabetes mellitus with diabetic neuropathy, unspecified: Secondary | ICD-10-CM | POA: Diagnosis present

## 2013-08-16 DIAGNOSIS — G8929 Other chronic pain: Secondary | ICD-10-CM

## 2013-08-16 DIAGNOSIS — G4700 Insomnia, unspecified: Secondary | ICD-10-CM | POA: Diagnosis present

## 2013-08-16 DIAGNOSIS — I10 Essential (primary) hypertension: Secondary | ICD-10-CM | POA: Diagnosis not present

## 2013-08-16 DIAGNOSIS — Z7982 Long term (current) use of aspirin: Secondary | ICD-10-CM

## 2013-08-16 DIAGNOSIS — I252 Old myocardial infarction: Secondary | ICD-10-CM

## 2013-08-16 DIAGNOSIS — E1129 Type 2 diabetes mellitus with other diabetic kidney complication: Secondary | ICD-10-CM | POA: Diagnosis present

## 2013-08-16 DIAGNOSIS — Z9119 Patient's noncompliance with other medical treatment and regimen: Secondary | ICD-10-CM

## 2013-08-16 DIAGNOSIS — N184 Chronic kidney disease, stage 4 (severe): Secondary | ICD-10-CM | POA: Diagnosis present

## 2013-08-16 DIAGNOSIS — N183 Chronic kidney disease, stage 3 unspecified: Secondary | ICD-10-CM | POA: Diagnosis present

## 2013-08-16 DIAGNOSIS — R079 Chest pain, unspecified: Secondary | ICD-10-CM | POA: Diagnosis present

## 2013-08-16 DIAGNOSIS — E1143 Type 2 diabetes mellitus with diabetic autonomic (poly)neuropathy: Secondary | ICD-10-CM | POA: Diagnosis present

## 2013-08-16 DIAGNOSIS — B3731 Acute candidiasis of vulva and vagina: Secondary | ICD-10-CM | POA: Diagnosis present

## 2013-08-16 DIAGNOSIS — E1149 Type 2 diabetes mellitus with other diabetic neurological complication: Secondary | ICD-10-CM

## 2013-08-16 DIAGNOSIS — K219 Gastro-esophageal reflux disease without esophagitis: Secondary | ICD-10-CM | POA: Diagnosis present

## 2013-08-16 DIAGNOSIS — F17200 Nicotine dependence, unspecified, uncomplicated: Secondary | ICD-10-CM | POA: Diagnosis present

## 2013-08-16 DIAGNOSIS — K3184 Gastroparesis: Secondary | ICD-10-CM | POA: Diagnosis present

## 2013-08-16 DIAGNOSIS — Z794 Long term (current) use of insulin: Secondary | ICD-10-CM

## 2013-08-16 DIAGNOSIS — F411 Generalized anxiety disorder: Secondary | ICD-10-CM | POA: Diagnosis present

## 2013-08-16 DIAGNOSIS — IMO0002 Reserved for concepts with insufficient information to code with codable children: Secondary | ICD-10-CM | POA: Diagnosis present

## 2013-08-16 LAB — URINALYSIS, ROUTINE W REFLEX MICROSCOPIC
Bilirubin Urine: NEGATIVE
Glucose, UA: >1000 mg/dL — AB
Ketones, ur: 15 mg/dL — AB
Leukocytes, UA: NEGATIVE
Protein, ur: 100 mg/dL — AB

## 2013-08-16 LAB — LIPASE, BLOOD: Lipase: 156 U/L — ABNORMAL HIGH (ref 11–59)

## 2013-08-16 LAB — COMPREHENSIVE METABOLIC PANEL
ALT: 10 U/L (ref 0–35)
AST: 18 U/L (ref 0–37)
Albumin: 4.4 g/dL (ref 3.5–5.2)
Alkaline Phosphatase: 111 U/L (ref 39–117)
BUN: 53 mg/dL — ABNORMAL HIGH (ref 6–23)
CO2: 28 mEq/L (ref 19–32)
Calcium: 9.4 mg/dL (ref 8.4–10.5)
Chloride: 79 mEq/L — ABNORMAL LOW (ref 96–112)
Creatinine, Ser: 2.8 mg/dL — ABNORMAL HIGH (ref 0.50–1.10)
GFR calc Af Amer: 22 mL/min — ABNORMAL LOW (ref >90)
GFR calc non Af Amer: 19 mL/min — ABNORMAL LOW (ref >90)
Glucose, Bld: 559 mg/dL (ref 70–99)
Potassium: 3.2 mEq/L — ABNORMAL LOW (ref 3.5–5.1)
Sodium: 127 mEq/L — ABNORMAL LOW (ref 135–145)
Total Bilirubin: 0.9 mg/dL (ref 0.3–1.2)
Total Protein: 8.1 g/dL (ref 6.0–8.3)

## 2013-08-16 LAB — URINE MICROSCOPIC-ADD ON

## 2013-08-16 LAB — CBC WITH DIFFERENTIAL/PLATELET
Basophils Absolute: 0 10*3/uL (ref 0.0–0.1)
Basophils Relative: 0 % (ref 0–1)
Eosinophils Absolute: 0 10*3/uL (ref 0.0–0.7)
MCH: 31.7 pg (ref 26.0–34.0)
MCHC: 36.9 g/dL — ABNORMAL HIGH (ref 30.0–36.0)
Monocytes Relative: 9 % (ref 3–12)
Neutro Abs: 14.3 10*3/uL — ABNORMAL HIGH (ref 1.7–7.7)
Neutrophils Relative %: 82 % — ABNORMAL HIGH (ref 43–77)
Platelets: 248 10*3/uL (ref 150–400)
RDW: 12.9 % (ref 11.5–15.5)

## 2013-08-16 MED ORDER — SODIUM CHLORIDE 0.9 % IV BOLUS (SEPSIS)
1000.0000 mL | Freq: Once | INTRAVENOUS | Status: AC
  Administered 2013-08-16: 1000 mL via INTRAVENOUS

## 2013-08-16 MED ORDER — SODIUM CHLORIDE 0.9 % IV SOLN: INTRAVENOUS | Status: DC

## 2013-08-16 MED ORDER — ONDANSETRON HCL 4 MG/2ML IJ SOLN
4.0000 mg | Freq: Once | INTRAMUSCULAR | Status: AC
  Administered 2013-08-16: 4 mg via INTRAVENOUS
  Filled 2013-08-16: qty 2

## 2013-08-16 MED ORDER — PROMETHAZINE HCL 25 MG/ML IJ SOLN
25.0000 mg | Freq: Once | INTRAMUSCULAR | Status: AC
  Administered 2013-08-17: 25 mg via INTRAVENOUS
  Filled 2013-08-16: qty 1

## 2013-08-16 MED ORDER — SODIUM CHLORIDE 0.9 % IV BOLUS (SEPSIS)
1000.0000 mL | Freq: Once | INTRAVENOUS | Status: AC
  Administered 2013-08-17: 1000 mL via INTRAVENOUS

## 2013-08-16 MED ORDER — HYDROMORPHONE HCL PF 1 MG/ML IJ SOLN
1.0000 mg | Freq: Once | INTRAMUSCULAR | Status: AC
  Administered 2013-08-17: 1 mg via INTRAVENOUS
  Filled 2013-08-16: qty 1

## 2013-08-16 MED ORDER — HYDROMORPHONE HCL PF 1 MG/ML IJ SOLN
1.0000 mg | Freq: Once | INTRAMUSCULAR | Status: AC
  Administered 2013-08-16: 1 mg via INTRAVENOUS
  Filled 2013-08-16: qty 1

## 2013-08-16 NOTE — ED Notes (Addendum)
PT. REPORTS GENERALIZED ABDOMINAL PAIN WITH NAUSEA , PERSISTENT VOMITTING AND OCCASIONAL DIARRHEA FOR SEVERAL DAYS , DENEIS FEVER OR CHILLS, NO URINARY DISCOMFORT.

## 2013-08-16 NOTE — ED Provider Notes (Signed)
CSN: [id number]     Arrival date & time 08/16/13  1951 History     First MD Initiated Contact with Patient 08/16/13 2124     Chief Complaint  Patient presents with  . Abdominal Pain   (Consider location/radiation/quality/duration/timing/severity/associated sxs/prior Treatment) Patient is a 47 y.o. female presenting with abdominal pain. The history is provided by the patient and medical records.  Abdominal Pain Pain location:  Epigastric Pain quality: cramping, gnawing and stabbing   Pain radiates to:  Chest Pain severity:  Moderate Onset quality:  Gradual Duration:  4 days Timing:  Constant Progression:  Worsening Chronicity:  Chronic Relieved by: narcotic pain medicine. Worsened by:  Position changes, movement and eating Ineffective treatments:  NSAIDs (phenergan) Associated symptoms: nausea   Associated symptoms: no chest pain, no chills, no cough, no diarrhea, no fatigue, no fever, no hematuria, no shortness of breath, no sore throat, no vaginal discharge and no vomiting     Past Medical History  Diagnosis Date  . PANCREATITIS 05/09/2008  . Cyclic vomiting syndrome   . Hypertension   . Complication of anesthesia     "problems waking up"  . Heart murmur   . Myocardial infarction 07/2007    "they say I've had a silent one; I don't know"  . Pneumonia   . Anemia   . Blood transfusion   . GERD (gastroesophageal reflux disease)   . Anxiety   . Depression   . Smoker   . Marijuana smoker, continuous   . NEUROPATHY, DIABETIC 02/23/2007  . Type II diabetes mellitus   . Diabetic gastroparesis     /e-chart   Past Surgical History  Procedure Laterality Date  . Port-a-cath placement  ~ 2008    right chest; "poor access; frequent hospitalizations"  . Laparotomy and lysis of adhesions    . Abdominal hysterectomy  02/2002  . Cholecystectomy  1980's  . Cardiac catheterization    . Dilation and evacuation  X3  . Left hand surgery    . Cataract extraction w/ intraocular lens   implant, bilateral    . Colonoscopy    . Upper gi endoscopy     Family History  Problem Relation Age of Onset  . Diabetes type II Mother   . Diabetes Mother   . Hypertension Mother   . Diabetes type II Sister   . Diabetes Sister   . Hypertension Sister   . Hypertension Brother    History  Substance Use Topics  . Smoking status: Current Some Day Smoker -- 0.50 packs/day for 22 years    Types: Cigarettes    Last Attempt to Quit: 02/22/2012  . Smokeless tobacco: Never Used  . Alcohol Use: No   OB History   Grav Para Term Preterm Abortions TAB SAB Ect Mult Living                 Review of Systems  Constitutional: Positive for appetite change. Negative for fever, chills, diaphoresis and fatigue.  HENT: Negative for ear pain, congestion, sore throat, facial swelling, mouth sores, trouble swallowing, neck pain and neck stiffness.   Eyes: Negative.   Respiratory: Negative for apnea, cough, chest tightness, shortness of breath and wheezing.   Cardiovascular: Negative for chest pain, palpitations and leg swelling.  Gastrointestinal: Positive for nausea and abdominal pain. Negative for vomiting, diarrhea and abdominal distention.  Genitourinary: Negative for hematuria, flank pain, vaginal discharge, difficulty urinating and menstrual problem.  Musculoskeletal: Negative for back pain and gait problem.  Skin: Negative  for rash and wound.  Neurological: Negative for dizziness, tremors, seizures, syncope, facial asymmetry, numbness and headaches.  Psychiatric/Behavioral: Negative.   All other systems reviewed and are negative.    Allergies  Bee venom; Acetaminophen; Novolog; and Erythromycin  Home Medications   Current Outpatient Rx  Name  Route  Sig  Dispense  Refill  . aspirin EC 81 MG tablet   Oral   Take 81 mg by mouth every morning.         . carvedilol (COREG) 25 MG tablet   Oral   Take 25 mg by mouth 2 (two) times daily with a meal.         . diclofenac sodium  (VOLTAREN) 1 % GEL   Topical   Apply 2 g topically 4 (four) times daily as needed (pain).         . insulin glargine (LANTUS) 100 UNIT/ML injection   Subcutaneous   Inject 20 Units into the skin every morning.          Marland Kitchen lisinopril (PRINIVIL,ZESTRIL) 40 MG tablet   Oral   Take 40 mg by mouth every morning.         Marland Kitchen oxyCODONE (ROXICODONE) 5 MG immediate release tablet   Oral   Take 1 tablet (5 mg total) by mouth every 8 (eight) hours as needed for pain.   5 tablet   0   . SUMAtriptan (IMITREX) 50 MG tablet   Oral   Take 50 mg by mouth every 2 (two) hours as needed for migraine.           BP 195/92  Pulse 98  Temp(Src) 98.8 F (37.1 C) (Oral)  Resp 19  SpO2 100% Physical Exam  Nursing note and vitals reviewed. Constitutional: She is oriented to person, place, and time. She appears well-developed and well-nourished. No distress.  HENT:  Head: Normocephalic and atraumatic.  Right Ear: External ear normal.  Left Ear: External ear normal.  Nose: Nose normal.  Mouth/Throat: Oropharynx is clear and moist. No oropharyngeal exudate.  Eyes: Conjunctivae and EOM are normal. Pupils are equal, round, and reactive to light. Right eye exhibits no discharge. Left eye exhibits no discharge.  Neck: Normal range of motion. Neck supple. No JVD present. No tracheal deviation present. No thyromegaly present.  Cardiovascular: Normal rate, regular rhythm, normal heart sounds and intact distal pulses.  Exam reveals no gallop and no friction rub.   No murmur heard. Pulmonary/Chest: Effort normal and breath sounds normal. No respiratory distress. She has no wheezes. She has no rales. She exhibits no tenderness.  Abdominal: Soft. Bowel sounds are normal. She exhibits no distension. There is generalized tenderness. There is no rebound and no guarding.  Patient with diffuse abdominal pain with palpation. I was able to palpate deeply with no guarding, rebound, rigidity or other signs of  peritonitis  Musculoskeletal: Normal range of motion.  Lymphadenopathy:    She has no cervical adenopathy.  Neurological: She is alert and oriented to person, place, and time. No cranial nerve deficit. Coordination normal.  Skin: Skin is warm. No rash noted. She is not diaphoretic.  Psychiatric: She has a normal mood and affect. Her behavior is normal. Judgment and thought content normal.    ED Course   Procedures (including critical care time)  Labs Reviewed  CBC WITH DIFFERENTIAL - Abnormal; Notable for the following:    WBC 17.4 (*)    HCT 33.3 (*)    MCHC 36.9 (*)    Neutrophils  Relative % 82 (*)    Neutro Abs 14.3 (*)    Lymphocytes Relative 9 (*)    Monocytes Absolute 1.5 (*)    All other components within normal limits  COMPREHENSIVE METABOLIC PANEL - Abnormal; Notable for the following:    Sodium 127 (*)    Potassium 3.2 (*)    Chloride 79 (*)    Glucose, Bld 559 (*)    BUN 53 (*)    Creatinine, Ser 2.80 (*)    GFR calc non Af Amer 19 (*)    GFR calc Af Amer 22 (*)    All other components within normal limits  LIPASE, BLOOD - Abnormal; Notable for the following:    Lipase 156 (*)    All other components within normal limits  LACTIC ACID, PLASMA  URINALYSIS, ROUTINE W REFLEX MICROSCOPIC   Dg Chest 2 View  08/16/2013   *RADIOLOGY REPORT*  Clinical Data: Abdominal pain, hypertension.  CHEST - 2 VIEW  Comparison: 07/07/2013  Findings: Right subclavian port catheter extends to the proximal SVC.  No pneumothorax.  Surgical clips in the right upper abdomen. Lungs clear.  Heart size and pulmonary vascularity normal.  No effusion.  Visualized bones unremarkable.  IMPRESSION: No acute disease   Original Report Authenticated By: D. Wallace Going, MD   1. Hyponatremia   2. Hypokalemia   3. Vomiting   4. Abdominal pain   5. Pancreatitis     MDM  47 yr old F pt here with abdominal pain. Patient has had recurrences of abdominal pain in the past here in the ED with normal work  up. It is reported in the past that patient has had some drug seeking qualities. Patient here seems well, but then when I approach room she moans out in pain. Patient says pain is diffusely in the abdomen. It does not seem to make the pain worse with palpation. Patient with normal BM's. No active vomiting here. Will obtain labs to assess pancreatitis as she has a history of this. Patient without uterus or gallbladder. Pain does not seem consistent with an appy or diverticulitis. Could be chronic abdominal pain given its similarity to previous exacerbations and patient requesting pain medicine by name. Patient says pain radiates to chest and that started in the lobby. I do not think this is ACS as EKG with no st changes and it is non-extertional. It is also not peritonitis or the primary complaint so I doubt PE.  Patient HR improving with fluid. Patient with elevated lipase, continued difficulty with nausea and unable to take PO. Her WBC is high and blood glucose is elevated. Low sodium and potassium. Given concerning changes, will admit the patient for further symptom care.   Date: 08/16/2013  Rate: 126  Rhythm: sinus tachycardia  QRS Axis: normal  Intervals: normal  ST/T Wave abnormalities: normal  Conduction Disutrbances:none  Narrative Interpretation:   Old EKG Reviewed: unchanged  Case discussed with Dr. Wendy Poet, MD 08/16/13 [phone]

## 2013-08-16 NOTE — ED Notes (Signed)
PT presents at triage desk c/o CP. Ambulatory

## 2013-08-16 NOTE — H&P (Signed)
[facility] Admission History and Physical Service Pager: [phone]  Patient name: NATAJAH RYBINSKI Medical record number: [id number] Date of birth: May 08, 1966 Age: 47 y.o. Gender: female  Primary Care Provider: Conni Slipper, MD Consultants: none yet Code Status: full  Chief Complaint: abdominal pain  Assessment and Plan: LODA OLBRICH is a 47 y.o. year old female presenting with presenting with acute severe epigastric abdominal pain radiating up into her chest, for four days, worse for the last 24 hours. Pt has a history of recurrent cyclical vomiting syndrome (known trigger of marijuana, last used two weeks ago), GERD, poorly-controlled diabetes with secondary CKD stage III and neuropathy, depression, chronic back pain. Pt has numerous similar presentations to the ED, often requiring admission and has a history of poor compliance to outpt medication regimens. Symptoms not relieved in the ED with NS boluses, Dilaudid, Phenergan. Ordered for GI cocktail, Protonix IV, and repeat bolus prior to leaving the ED.  # Acute abdominal pain - radiates to chest, though doubt cardiac cause -will cycle troponins and repeat EKG (pt per chart review has had ?NSTEMI in the past) -DDx broad, acute exacerbation of cyclic vomiting, acute pancreatitis (lipase 156) -will treat for presumed pancreatitis: NPO, IVF, Dilaudid IV with transition to PO as tolerated -will add low-dose Ativan PRN q8 for multimodal pain control and antiemetic effect -continue PPI IV with plan to change to PO as tolerated (?should this be used chronically as outpt) -will check KUB to check for free air to rule out more serious cause such as perforated ulcer, etc -will need to consider more advanced imaging if no improvement  # DM with acute hyperglycemia - poor control as an outpt, glucose 559 in the ED -continue home Lantus 10 units daily, with regular CBG checks -pt with reported intolerance to  Novolog ("muscle cramps in feet" on allergy list"), but will favor SSI while admitted -anticipate better CBG control with insulin inpt as well as aggressive hydration  # Cyclic vomiting syndrome - possible contribution to above symptoms -management as above; will need to continue to reinforce 1) med compliance, 2) avoidance of marijuana  # CKD - Cr 2.8, baseline ~2 -aggressive hydration, as above -monitor daily as needed  # HTN - systolic up to [id number] in the ED, likely exacerbated by acute pain; question compliance at home, as well -continue lisinopril, Coreg  # Hx of neuropathy, migraine - no current complaints; hold home Voltaren and Imitrex unless acute issues arise  FEN/GI: NPO (sips/ice chips with meds), NS at 100 mL/h Prophylaxis: PPI, subQ heparin  Disposition: admit to [facility], attending Dr. McDiarmid, telemetry bed, with management as above.  History of Present Illness: SUSSY TSUJI is a 47 y.o. year old female presenting with acute severe epigastric abdominal pain radiating up into her chest, for four days, worse for the last 24 hours. Pt has a history of recurrent cyclical vomiting syndrome (known trigger of marijuana, last used two weeks ago), GERD, poorly-controlled diabetes with secondary CKD stage III and neuropathy, depression, chronic back pain. Pt has numerous similar presentations to the ED, often requiring admission and has a history of poor compliance to outpt medication regimens. Pt reports severe nausea/vomiting and upper abdominal pain for 4 days, worse for the last day; denies known definite triggers and states she smoked marijuana two weeks ago, but "this is worse, it isn't that." Pt states pain is 10/10, epigastric, sharp and burning in nature, and radiates up into chest. Emesis in the ED  visualized clear, mucousy, though pt states it was "red" but does not describe it beyond that. Symptoms relieved in the ED with NS, Dilaudid x2, and Phenergan IV.   Denies SOB, dysuria,  change in bowel habits (?few loose stools, pt inconsistent with complaints), rash, headache, change in vision. Interview difficult secondary to pt cooperation (actively moaning, rolling on bed, though stops and appears in less distress when asked repeatedly).  Review Of Systems: Per HPI. Otherwise 12 point review of systems was performed and was unremarkable.  Patient Active Problem List   Diagnosis Date Noted  . Viral URI with cough 07/25/2013  . Strep throat 07/13/2013  . Trichomonas 07/13/2013  . Chest pain 06/25/2013  . Hyperkalemia 06/25/2013  . Pruritus 06/20/2013  . Abdominal pain, epigastric 04/19/2013  . Esophagitis 04/18/2013  . Preventative health care 09/18/2012  . High risk social situation 08/31/2012  . Allergic rhinitis, seasonal 05/04/2012  . Marijuana smoker 11/03/2011  . Uncontrolled secondary diabetes mellitus with stage 3 CKD (GFR 30-59) 10/23/2011  . Cyclic vomiting syndrome 08/03/2011  . Hot flashes 07/29/2011  . BACK PAIN, CHRONIC 04/10/2007  . DEPRESSION, MAJOR, RECURRENT 02/23/2007  . TOBACCO DEPENDENCE 02/23/2007  . MIGRAINE, UNSPEC., W/O INTRACTABLE MIGRAINE 02/23/2007  . Essential hypertension, malignant 02/23/2007  . INSOMNIA NOS 02/23/2007  . NEUROPATHY, DIABETIC 02/23/2007  . GERD 01/27/2004   Past Medical History: Past Medical History  Diagnosis Date  . PANCREATITIS 05/09/2008  . Cyclic vomiting syndrome   . Hypertension   . Complication of anesthesia     "problems waking up"  . Heart murmur   . Myocardial infarction 07/2007    "they say I've had a silent one; I don't know"  . Pneumonia   . Anemia   . Blood transfusion   . GERD (gastroesophageal reflux disease)   . Anxiety   . Depression   . Smoker   . Marijuana smoker, continuous   . NEUROPATHY, DIABETIC 02/23/2007  . Type II diabetes mellitus   . Diabetic gastroparesis     /e-chart   Past Surgical History: Past Surgical History  Procedure Laterality Date  . Port-a-cath placement   ~ 2008    right chest; "poor access; frequent hospitalizations"  . Laparotomy and lysis of adhesions    . Abdominal hysterectomy  02/2002  . Cholecystectomy  1980's  . Cardiac catheterization    . Dilation and evacuation  X3  . Left hand surgery    . Cataract extraction w/ intraocular lens  implant, bilateral    . Colonoscopy    . Upper gi endoscopy     Social History: History  Substance Use Topics  . Smoking status: Current Some Day Smoker -- 0.50 packs/day for 22 years    Types: Cigarettes    Last Attempt to Quit: 02/22/2012  . Smokeless tobacco: Never Used  . Alcohol Use: No   Additional social history: Pt admits to marijuana use (2 weeks ago), known trigger for cyclic vomiting, but current symptoms worse than normal and only present for 4 days, as per HPI.  Please also refer to relevant sections of EMR.  Family History: Family History  Problem Relation Age of Onset  . Diabetes type II Mother   . Diabetes Mother   . Hypertension Mother   . Diabetes type II Sister   . Diabetes Sister   . Hypertension Sister   . Hypertension Brother    Allergies and Medications: Allergies  Allergen Reactions  . Bee Venom Anaphylaxis  . Acetaminophen Nausea And  Vomiting  . Novolog [Insulin Aspart] Other (See Comments)    Muscles in feet cramp  . Erythromycin Nausea And Vomiting   Current Facility-Administered Medications on File Prior to Encounter  Medication Dose Route Frequency Provider Last Rate Last Dose  . 0.9 %  sodium chloride infusion   Intravenous Continuous Andrena Mews, MD 20 mL/hr at 04/19/13 1500 500 mL at 04/19/13 1500   Current Outpatient Prescriptions on File Prior to Encounter  Medication Sig Dispense Refill  . aspirin EC 81 MG tablet Take 81 mg by mouth every morning.      . carvedilol (COREG) 25 MG tablet Take 25 mg by mouth 2 (two) times daily with a meal.      . diclofenac sodium (VOLTAREN) 1 % GEL Apply 2 g topically 4 (four) times daily as needed (pain).       . insulin glargine (LANTUS) 100 UNIT/ML injection Inject 20 Units into the skin every morning.       Marland Kitchen lisinopril (PRINIVIL,ZESTRIL) 40 MG tablet Take 40 mg by mouth every morning.      Marland Kitchen oxyCODONE (ROXICODONE) 5 MG immediate release tablet Take 1 tablet (5 mg total) by mouth every 8 (eight) hours as needed for pain.  5 tablet  0  . SUMAtriptan (IMITREX) 50 MG tablet Take 50 mg by mouth every 2 (two) hours as needed for migraine.        Objective: BP 195/92  Pulse 98  Temp(Src) 98.8 F (37.1 C) (Oral)  Resp 19  SpO2 100% Exam: General: adult female in marked distress, moaning, actively rolling back and forth on bed, difficult to examine HEENT: [place]/AT, mucous membranes slightly dry, TMs clear bilaterally, PERRLA, EOMI Cardiovascular: difficult to auscultate, but clinically mildly tachycardic and no murmur appreciated Respiratory: difficult to auscultate but generally clear without focal abnormality Abdomen: diffusely very tender, pt pushes hand away, though worst tenderness seems epigastric radiating up into chest with palpation Extremities: warm, well-perfused, no LE edema appreciated Skin: no frank rash noted Neuro: difficult to examine, but pt actively moving all extremities and is awake/alert  Labs and Imaging: CBC BMET   Recent Labs Lab 08/16/13 2238  WBC 17.4*  HGB 12.3  HCT 33.3*  PLT 248    Recent Labs Lab 08/16/13 2238  NA 127*  K 3.2*  CL 79*  CO2 28  BUN 53*  CREATININE 2.80*  GLUCOSE 559*  CALCIUM 9.4     UA 8/21 @2310 : >1000 glucose, 15 ketones, 100 protein, mod Hb  CXR 8/21 @2351 : right subclavian port, surgical clips RUQ abd; no acute pulmonary process EKG 8/21 (ED): sinus tachycardia with significant baseline wander, no frank ischemia but difficult to interpret  Emmaline Kluver, MD 08/16/2013, 11:44 PM PGY-2, [place] [facility] pager: [phone], text pages welcome

## 2013-08-17 ENCOUNTER — Inpatient Hospital Stay (HOSPITAL_COMMUNITY): Payer: Medicare Other

## 2013-08-17 DIAGNOSIS — E871 Hypo-osmolality and hyponatremia: Secondary | ICD-10-CM | POA: Diagnosis not present

## 2013-08-17 DIAGNOSIS — E1149 Type 2 diabetes mellitus with other diabetic neurological complication: Secondary | ICD-10-CM

## 2013-08-17 DIAGNOSIS — N184 Chronic kidney disease, stage 4 (severe): Secondary | ICD-10-CM | POA: Diagnosis present

## 2013-08-17 DIAGNOSIS — R109 Unspecified abdominal pain: Secondary | ICD-10-CM | POA: Diagnosis not present

## 2013-08-17 DIAGNOSIS — K859 Acute pancreatitis without necrosis or infection, unspecified: Secondary | ICD-10-CM | POA: Diagnosis not present

## 2013-08-17 DIAGNOSIS — N189 Chronic kidney disease, unspecified: Secondary | ICD-10-CM

## 2013-08-17 HISTORY — DX: Chronic kidney disease, unspecified: N18.9

## 2013-08-17 LAB — HEMOGLOBIN A1C
Hgb A1c MFr Bld: 9.9 % — ABNORMAL HIGH (ref <5.7)
Mean Plasma Glucose: 237 mg/dL — ABNORMAL HIGH (ref <117)

## 2013-08-17 LAB — CBC
HCT: 33.5 % — ABNORMAL LOW (ref 36.0–46.0)
Hemoglobin: 12.3 g/dL (ref 12.0–15.0)
MCHC: 36.7 g/dL — ABNORMAL HIGH (ref 30.0–36.0)
MCV: 86.8 fL (ref 78.0–100.0)

## 2013-08-17 LAB — BASIC METABOLIC PANEL
BUN: 49 mg/dL — ABNORMAL HIGH (ref 6–23)
CO2: 24 mEq/L (ref 19–32)
Calcium: 8.6 mg/dL (ref 8.4–10.5)
Chloride: 87 mEq/L — ABNORMAL LOW (ref 96–112)
Creatinine, Ser: 2.38 mg/dL — ABNORMAL HIGH (ref 0.50–1.10)
Glucose, Bld: 465 mg/dL — ABNORMAL HIGH (ref 70–99)

## 2013-08-17 LAB — GLUCOSE, CAPILLARY
Glucose-Capillary: 461 mg/dL — ABNORMAL HIGH (ref 70–99)
Glucose-Capillary: 140 mg/dL — ABNORMAL HIGH (ref 70–99)
Glucose-Capillary: 130 mg/dL — ABNORMAL HIGH (ref 70–99)

## 2013-08-17 LAB — TROPONIN I: Troponin I: <0.3 ng/mL (ref <0.30)

## 2013-08-17 MED ORDER — LISINOPRIL 40 MG PO TABS
40.0000 mg | ORAL_TABLET | Freq: Every morning | ORAL | Status: DC
  Administered 2013-08-17 – 2013-08-20 (×4): 40 mg via ORAL
  Filled 2013-08-17 (×4): qty 1

## 2013-08-17 MED ORDER — ONDANSETRON HCL 4 MG PO TABS
4.0000 mg | ORAL_TABLET | Freq: Four times a day (QID) | ORAL | Status: DC | PRN
  Administered 2013-08-20: 4 mg via ORAL
  Filled 2013-08-17 (×2): qty 1

## 2013-08-17 MED ORDER — SODIUM CHLORIDE 0.9 % IV SOLN: INTRAVENOUS | Status: DC

## 2013-08-17 MED ORDER — GI COCKTAIL ~~LOC~~
30.0000 mL | Freq: Two times a day (BID) | ORAL | Status: DC | PRN
  Administered 2013-08-18 – 2013-08-19 (×3): 30 mL via ORAL
  Filled 2013-08-17 (×3): qty 30

## 2013-08-17 MED ORDER — HEPARIN SODIUM (PORCINE) 5000 UNIT/ML IJ SOLN
5000.0000 [IU] | Freq: Three times a day (TID) | INTRAMUSCULAR | Status: DC
  Administered 2013-08-17 – 2013-08-20 (×11): 5000 [IU] via SUBCUTANEOUS
  Filled 2013-08-17 (×14): qty 1

## 2013-08-17 MED ORDER — HYDROMORPHONE HCL PF 1 MG/ML IJ SOLN
1.0000 mg | INTRAMUSCULAR | Status: DC | PRN
  Administered 2013-08-17 (×2): 1 mg via INTRAVENOUS
  Filled 2013-08-17 (×2): qty 1

## 2013-08-17 MED ORDER — LABETALOL HCL 5 MG/ML IV SOLN
10.0000 mg | INTRAVENOUS | Status: DC | PRN
  Administered 2013-08-17 (×2): 10 mg via INTRAVENOUS
  Filled 2013-08-17 (×2): qty 4

## 2013-08-17 MED ORDER — SODIUM CHLORIDE 0.9 % IV BOLUS (SEPSIS): 1000.0000 mL | Freq: Once | INTRAVENOUS | Status: DC

## 2013-08-17 MED ORDER — SODIUM CHLORIDE 0.45 % IV SOLN
INTRAVENOUS | Status: DC
  Administered 2013-08-17: 1000 mL via INTRAVENOUS

## 2013-08-17 MED ORDER — HYDROMORPHONE HCL PF 1 MG/ML IJ SOLN
1.0000 mg | INTRAMUSCULAR | Status: DC | PRN
  Administered 2013-08-17: 1 mg via INTRAVENOUS
  Filled 2013-08-17: qty 1

## 2013-08-17 MED ORDER — LORAZEPAM 2 MG/ML IJ SOLN
0.5000 mg | Freq: Three times a day (TID) | INTRAMUSCULAR | Status: DC | PRN
  Administered 2013-08-17: 0.5 mg via INTRAVENOUS
  Filled 2013-08-17: qty 1

## 2013-08-17 MED ORDER — ONDANSETRON HCL 4 MG/2ML IJ SOLN
4.0000 mg | Freq: Four times a day (QID) | INTRAMUSCULAR | Status: DC | PRN
  Administered 2013-08-17 – 2013-08-20 (×3): 4 mg via INTRAVENOUS
  Filled 2013-08-17 (×4): qty 2

## 2013-08-17 MED ORDER — PANTOPRAZOLE SODIUM 40 MG IV SOLR
40.0000 mg | Freq: Every day | INTRAVENOUS | Status: DC
  Filled 2013-08-17: qty 40

## 2013-08-17 MED ORDER — GI COCKTAIL ~~LOC~~
30.0000 mL | Freq: Once | ORAL | Status: DC
  Administered 2013-08-17: 30 mL via ORAL
  Filled 2013-08-17: qty 30

## 2013-08-17 MED ORDER — CARVEDILOL 25 MG PO TABS
25.0000 mg | ORAL_TABLET | Freq: Two times a day (BID) | ORAL | Status: DC
  Administered 2013-08-17 – 2013-08-20 (×8): 25 mg via ORAL
  Filled 2013-08-17 (×9): qty 1

## 2013-08-17 MED ORDER — HYDROMORPHONE HCL PF 1 MG/ML IJ SOLN
2.0000 mg | Freq: Four times a day (QID) | INTRAMUSCULAR | Status: DC
  Administered 2013-08-17 – 2013-08-18 (×4): 2 mg via INTRAVENOUS
  Filled 2013-08-17 (×4): qty 2

## 2013-08-17 MED ORDER — SODIUM CHLORIDE 0.9 % IJ SOLN
3.0000 mL | Freq: Two times a day (BID) | INTRAMUSCULAR | Status: DC
  Administered 2013-08-17: 3 mL via INTRAVENOUS

## 2013-08-17 MED ORDER — PANTOPRAZOLE SODIUM 40 MG IV SOLR
40.0000 mg | Freq: Once | INTRAVENOUS | Status: AC
  Administered 2013-08-17: 40 mg via INTRAVENOUS
  Filled 2013-08-17: qty 40

## 2013-08-17 MED ORDER — INSULIN ASPART 100 UNIT/ML ~~LOC~~ SOLN
0.0000 [IU] | Freq: Three times a day (TID) | SUBCUTANEOUS | Status: DC
  Administered 2013-08-17: 9 [IU] via SUBCUTANEOUS
  Administered 2013-08-18: 2 [IU] via SUBCUTANEOUS
  Administered 2013-08-18 (×2): 1 [IU] via SUBCUTANEOUS
  Administered 2013-08-19: 2 [IU] via SUBCUTANEOUS
  Administered 2013-08-19: 5 [IU] via SUBCUTANEOUS
  Administered 2013-08-19: 2 [IU] via SUBCUTANEOUS
  Administered 2013-08-20: 3 [IU] via SUBCUTANEOUS
  Administered 2013-08-20: 7 [IU] via SUBCUTANEOUS

## 2013-08-17 MED ORDER — PROMETHAZINE HCL 25 MG/ML IJ SOLN
25.0000 mg | Freq: Four times a day (QID) | INTRAMUSCULAR | Status: AC
  Administered 2013-08-17 – 2013-08-18 (×7): 25 mg via INTRAVENOUS
  Filled 2013-08-17 (×8): qty 1

## 2013-08-17 MED ORDER — INSULIN GLARGINE 100 UNIT/ML ~~LOC~~ SOLN
10.0000 [IU] | Freq: Every day | SUBCUTANEOUS | Status: DC
  Administered 2013-08-18 – 2013-08-20 (×3): 10 [IU] via SUBCUTANEOUS
  Filled 2013-08-17 (×4): qty 0.1

## 2013-08-17 MED ORDER — INSULIN GLARGINE 100 UNIT/ML ~~LOC~~ SOLN
20.0000 [IU] | Freq: Every morning | SUBCUTANEOUS | Status: DC
  Filled 2013-08-17: qty 0.2

## 2013-08-17 MED ORDER — SODIUM CHLORIDE 0.9 % IV SOLN
INTRAVENOUS | Status: DC
  Administered 2013-08-17 – 2013-08-20 (×5): via INTRAVENOUS

## 2013-08-17 MED ORDER — LORAZEPAM 2 MG/ML IJ SOLN
1.0000 mg | Freq: Three times a day (TID) | INTRAMUSCULAR | Status: AC
  Administered 2013-08-17 – 2013-08-18 (×4): 1 mg via INTRAVENOUS
  Filled 2013-08-17 (×4): qty 1

## 2013-08-17 MED ORDER — PANTOPRAZOLE SODIUM 40 MG IV SOLR
40.0000 mg | Freq: Two times a day (BID) | INTRAVENOUS | Status: DC
  Administered 2013-08-17 – 2013-08-18 (×4): 40 mg via INTRAVENOUS
  Filled 2013-08-17 (×6): qty 40

## 2013-08-17 MED ORDER — HYDROMORPHONE HCL PF 1 MG/ML IJ SOLN: 1.0000 mg | Freq: Once | INTRAMUSCULAR | Status: DC

## 2013-08-17 MED ORDER — POTASSIUM CHLORIDE 10 MEQ/100ML IV SOLN
10.0000 meq | Freq: Once | INTRAVENOUS | Status: AC
  Administered 2013-08-17: 10 meq via INTRAVENOUS
  Filled 2013-08-17: qty 100

## 2013-08-17 NOTE — Care Management Note (Unsigned)
    Page 1 of 1   08/17/2013     1:04:28 PM   CARE MANAGEMENT NOTE 08/17/2013  Patient:  [name] [name], [name] [name]   Account Number:  [id number]  Date Initiated:  08/17/2013  Documentation initiated by:  Klare Criss  Subjective/Objective Assessment:   PT ADM ON 08/16/13 WITH ABD PAIN, PANCREATITIS.  PTA, PT INDEPENDENT OF ADLS.     Action/Plan:   WILL FOLLOW FOR DISCHARGE NEEDS AS PT PROGRESSES.   Anticipated DC Date:  08/20/2013   Anticipated DC Plan:  [place]  CM consult      Choice offered to / List presented to:             Status of service:  In process, will continue to follow Medicare Important Message given?   (If response is "NO", the following Medicare IM given date fields will be blank) Date Medicare IM given:   Date Additional Medicare IM given:    Discharge Disposition:    Per UR Regulation:  Reviewed for med. necessity/level of care/duration of stay  If discussed at [place] of Stay Meetings, dates discussed:    Comments:

## 2013-08-17 NOTE — H&P (Signed)
I have seen and examined this patient. I have discussed with Dr Venetia Maxon.  I agree with their findings and plans as documented in their admission note.  Acute Issues 1. Recurrent Nausea and Vomiting - Recurrent Pancreatitis Vs Exacerbation of Cyclic Vomiting Syndrome/Diabetic Gastroparesis - Lipase elevation between 2x to 3x ULN. S/P cholecystectomy. Triglycerides 59 (06/26/13). Denies alcohol intake.  - Patient with history of pancreatitis in 04/2008. [place] 02/14 found no pancreatic abnormalities though it was not optimally visualized.  - If pancreatitis, only end organ problem is the mild AKI on CKD III, so would consider it a mild-to-moderate acute Pancreatitis.   Plan:  - Agree with NPO status - IV hydration - Scheduled hydromorphone/Ativan/phenrgan for next 24 to 48 hours to break cycle of pain and vomiting. Monitor for excess sedation.  - Follow up serum Creatinine.  - Monitor for progressive end organ injury in setting of possible acute pancreatitis.

## 2013-08-17 NOTE — Progress Notes (Signed)
Pts BP upon admission to the unit was 206/128. Nurse paged Dr. Venetia Maxon, and he instructed the nurse to give 2 ml of labetalol IV to the patient. The nurse performed as was instructed. Fara Boros

## 2013-08-17 NOTE — Progress Notes (Signed)
PGY-2 Update Note  Paged by RN when pt arrived to floor, BP >200/120. Changed NS to 1/2-NS and added labetalol PRN. Believe some elevation in BP possibly secondary to pain and fluid resuscitation. Will reassess regularly as needed.  Emmaline Kluver, MD PGY-2, [facility] 08/17/2013, 1:39 AM FPTS Service pager: [phone] (text pages welcome through [facility])

## 2013-08-17 NOTE — Progress Notes (Signed)
Hypoglycemic Event  CBG41  Treatment: D50 IV 50 mL  Symptoms: None  Follow-up CBG: Tim1200 CBG Result:115  Possible Reasons for Event: Inadequate meal intake  Comments/MD notified: change fluids from normal saline to d5 with normal saline    Amamda Curbow, Christy Sartorius  Remember to initiate Hypoglycemia Order Set & complete

## 2013-08-17 NOTE — Progress Notes (Signed)
Family Medicine Teaching Service Daily Progress Note Intern Pager: [phone]  Patient name: [name] [name] Medical record number: [id number] Date of birth: 02-08-1966 Age: 47 y.o. Gender: female  Primary Care Provider: Conni Slipper, MD Consultants: none Code Status: Full  Pt Overview and Major Events to Date:  8/22 - Lipase 156 / NPO - 0.45 NS 150cc/hr / started Dilaudid 2mg  q 6, Ativan 1mg  q 8, Phenergan 25mg  q 6  Assessment and Plan:  [name] [name] is a 47 y.o. year old female presenting with presenting with acute severe epigastric abdominal pain radiating up into her chest, for four days, worse for the last 24 hours. Pt has a history of recurrent cyclical vomiting syndrome (known trigger of marijuana, last used two weeks ago), GERD, poorly-controlled diabetes with secondary CKD stage III and neuropathy, depression, chronic back pain. Pt has numerous similar presentations to the ED, often requiring admission and has a history of poor compliance to outpt medication regimens. Symptoms not relieved in the ED with NS boluses, Dilaudid, Phenergan. Ordered for GI cocktail, Protonix IV, and repeat bolus prior to leaving the ED.   # Acute abdominal pain, epigastric - LUQ -DDx broad, acute exacerbation of cyclic vomiting, acute pancreatitis (lipase 156) - ACS? - radiates to chest, though doubt cardiac cause - troponins (negative x2), EKG unchanged - Presumed Pancreatitis - NPO - 0.5 NS 150cc/hr IVF - scheduled pain / anxiety / anti-emetic meds:    - started Dilaudid 2mg  q 6, Ativan 1mg  q 8, Phenergan 25mg  q 6 (Orders for 2x days - then switch to PRN)    - plan to transition to Dilaudid PO as tolerated in 2-3 days, patient does have hx of chronic narcotic use, and requests adjusting her pain medicine on discharge    - ativan for multimodal pain control and anti-emetic effect -continue Protonix 40mg  IV BID with plan to change to PO as tolerated [ ]  Consider advanced imaging (CT?) if no  improvement in 24 hours  - CXR, KUB negative  # DM with acute hyperglycemia - poor control as an outpt, glucose 559 in the ED  - continue home Lantus 10 units daily, with regular CBG checks [ ]  f/u CBGs   - significant improvement after 9u Novolog (410 to 139) - pt with reported intolerance to Novolog ("muscle cramps in feet" on allergy list"), but will favor SSI while admitted  - anticipate better CBG control with insulin inpt as well as aggressive hydration  # Cyclic vomiting syndrome - possible contribution to above symptoms  -management as above; will need to continue to reinforce 1) med compliance, 2) avoidance of marijuana   # CKD - Cr 2.8, baseline ~2  -aggressive hydration, as above  - Cr trend: 2.38 ([id number])  # HTN - systolic up to 99991111 in the ED, likely exacerbated by acute pain; question compliance at home, as well  - BP trend (170s/80), tachycardic 100s - continue lisinopril 40mg , Coreg 25mg  BID  # Hx of neuropathy, migraine - no current complaints; hold home Voltaren and Imitrex unless acute issues arise  # Hx of STI - reported hx of STI, concern that this may be contributing to abd pain? - pending GC / Chlam probe Urine - Consider PID in differential?  FEN/GI: NPO (sips/ice chips with meds), NS at 100 mL/h  Prophylaxis: PPI, subQ heparin  Disposition: Home pending clinical course  Subjective: Patient laying in bed on her side, reporting recent history of abdominal pain (mostly central, left) for past 4  days. Initially her pain was >10/10, but now she states 6/10 (with IV Pain medications). Describes it as sharp and radiating to back / L-side / chest. Continues to feel nausea, with some improvement. No vomiting since in hospital.  Denies - Chest tightness / pressure, SoB, lightheadedness / dizziness / weakness, vomiting, constipation / diarrhea.  Objective: Temp:  [97.9 F (36.6 C)-98.8 F (37.1 C)] 97.9 F (36.6 C) (08/22 0424) Pulse Rate:  [98-126] 104 (08/22  0727) Resp:  [12-20] 18 (08/22 0424) BP: (151-210)/(79-129) 169/79 mmHg (08/22 0727) SpO2:  [99 %-100 %] 99 % (08/22 0424) Weight:  [146 lb 13.2 oz (66.6 kg)] 146 lb 13.2 oz (66.6 kg) (08/22 0122) Physical Exam: General: laying on side in bed, appears mild distress due to pain, cooperative with exam HEENT: PERRLA, MMM Cardiovascular: tachycardic w/o appreciable murmur Respiratory: CTAB Abdomen: +tender epigastric / LUQ - mild and deep palpation, No rebound or guarding, soft, no masses palpated. Pt appears anxious on abd exam, but cooperative. McBurneys negative. Peritoneal signs negative. Hx choleycystectomy Extremities: no edema, +2 peripheral pulses, non-tender, moves all Neuro - awake, alert, oriented, cooperative  Laboratory:  Recent Labs Lab 08/16/13 2238 08/17/13 0200  WBC 17.4* 17.6*  HGB 12.3 12.3  HCT 33.3* 33.5*  PLT 248 231    Recent Labs Lab 08/16/13 2238 08/17/13 0120  NA 127* 129*  K 3.2* 3.7  CL 79* 87*  CO2 28 24  BUN 53* 49*  CREATININE 2.80* 2.38*  CALCIUM 9.4 8.6  PROT 8.1  --   BILITOT 0.9  --   ALKPHOS 111  --   ALT 10  --   AST 18  --   GLUCOSE 559* 465*   Troponins - negative x2  8/21 UA - negative leuks / nitrites / no WBCs / rare bacteria - >1000 gluc, 15 ketones, protein 100  8/21 Lipase - 156  8/22 Blood cultures x2 - pending  Imaging/Diagnostic Tests:  8/21 2v CXR - Negative 8/22 KUB - Negative  Nobie Putnam, DO 08/17/2013, 1:09 PM PGY-1, [place] Intern pager: [phone], text pages welcome

## 2013-08-17 NOTE — Progress Notes (Signed)
FMTS Attending Admission Note: Antonios Ostrow,MD I  have seen and examined this patient, reviewed their chart. I have discussed this patient with the resident. I agree with the resident's findings, assessment and care plan.  

## 2013-08-17 NOTE — ED Provider Notes (Signed)
I saw and evaluated the patient, reviewed the resident's note and I agree with the findings and plan.   Osvaldo Shipper, MD 08/17/13 [phone]

## 2013-08-18 ENCOUNTER — Encounter (HOSPITAL_COMMUNITY): Payer: Self-pay | Admitting: Family Medicine

## 2013-08-18 DIAGNOSIS — R1013 Epigastric pain: Secondary | ICD-10-CM | POA: Diagnosis not present

## 2013-08-18 DIAGNOSIS — R1115 Cyclical vomiting syndrome unrelated to migraine: Secondary | ICD-10-CM

## 2013-08-18 DIAGNOSIS — E871 Hypo-osmolality and hyponatremia: Secondary | ICD-10-CM | POA: Diagnosis not present

## 2013-08-18 DIAGNOSIS — E1149 Type 2 diabetes mellitus with other diabetic neurological complication: Secondary | ICD-10-CM | POA: Diagnosis not present

## 2013-08-18 LAB — CBC
HCT: 29.5 % — ABNORMAL LOW (ref 36.0–46.0)
Hemoglobin: 10.3 g/dL — ABNORMAL LOW (ref 12.0–15.0)
MCH: 31.2 pg (ref 26.0–34.0)
MCHC: 34.9 g/dL (ref 30.0–36.0)
MCV: 89.4 fL (ref 78.0–100.0)
Platelets: 191 10*3/uL (ref 150–400)
RBC: 3.3 MIL/uL — ABNORMAL LOW (ref 3.87–5.11)
RDW: 13.4 % (ref 11.5–15.5)
WBC: 9.3 10*3/uL (ref 4.0–10.5)

## 2013-08-18 LAB — BASIC METABOLIC PANEL
BUN: 30 mg/dL — ABNORMAL HIGH (ref 6–23)
CO2: 25 mEq/L (ref 19–32)
Calcium: 9.1 mg/dL (ref 8.4–10.5)
Chloride: 100 mEq/L (ref 96–112)
Creatinine, Ser: 1.95 mg/dL — ABNORMAL HIGH (ref 0.50–1.10)
GFR calc Af Amer: 34 mL/min — ABNORMAL LOW (ref >90)
GFR calc non Af Amer: 29 mL/min — ABNORMAL LOW (ref >90)
Glucose, Bld: 141 mg/dL — ABNORMAL HIGH (ref 70–99)
Potassium: 3.7 mEq/L (ref 3.5–5.1)
Sodium: 136 mEq/L (ref 135–145)

## 2013-08-18 LAB — GLUCOSE, CAPILLARY
Glucose-Capillary: 137 mg/dL — ABNORMAL HIGH (ref 70–99)
Glucose-Capillary: 141 mg/dL — ABNORMAL HIGH (ref 70–99)

## 2013-08-18 MED ORDER — FLUCONAZOLE 150 MG PO TABS
150.0000 mg | ORAL_TABLET | Freq: Once | ORAL | Status: AC
  Administered 2013-08-18: 150 mg via ORAL
  Filled 2013-08-18: qty 1

## 2013-08-18 MED ORDER — OXYCODONE HCL 5 MG PO TABS
15.0000 mg | ORAL_TABLET | ORAL | Status: DC | PRN
  Administered 2013-08-18 – 2013-08-20 (×8): 15 mg via ORAL
  Filled 2013-08-18 (×8): qty 3

## 2013-08-18 MED ORDER — BOOST / RESOURCE BREEZE PO LIQD
1.0000 | Freq: Two times a day (BID) | ORAL | Status: DC
  Administered 2013-08-19 – 2013-08-20 (×3): 1 via ORAL

## 2013-08-18 NOTE — Progress Notes (Signed)
Nursing Note:  Patient has new onset confusion today. She has asked the NT to get her pain medications out of the attic and if she fed the cat. She has also told me that she is so hungry and hasn't eaten anything today and just finished her lunch tray. Notified the teaching service MD on call. Will continue to monitor. Glade Nurse, RN

## 2013-08-18 NOTE — Progress Notes (Signed)
FMTS Attending Admission Note: Kehinde Eniola,MD I  have seen and examined this patient, reviewed their chart. I have discussed this patient with the resident. I agree with the resident's findings, assessment and care plan.  Patient denies any vomiting over the last 2 days,her abdominal pain has improved a lot,she stated she is hungry as she has not had anything to eat for 5 days since she started vomiting.No change in her bowel movement. She is also concern about a whitish vaginal discharge she has had for about 5 days now,she denies any dysuria,no blood in the urine,just an annoying vaginal discharge,she denies being sexually active,as per patient her last sexual activity was 11 yrs ago.  Filed Vitals:   08/17/13 0727 08/17/13 1320 08/17/13 2019 08/18/13 0454  BP: 169/79 140/71 146/69 135/63  Pulse: 104 98 83 66  Temp:  98.5 F (36.9 C) 99 F (37.2 C) 98.6 F (37 C)  TempSrc:  Oral Oral Oral  Resp:  18  18  Height:      Weight:    150 lb 5.7 oz (68.2 kg)  SpO2:  100% 100% 99%    Exam: Gen: Calm in bed,not in distress. HEENT: PERRLA,EOMI. Resp: Air entry equal B/L Heart: S1 S2 normal,no murmurs. Abd: Mildly tender suprapubic,BS normal. Ext: no edema: GU: deferred,no equipment for assessment on the floor.  A/P; 1. Abdominal pain: Improved.    May advance diet at this time as tolerated.    Continue Protonix 40 mg BID,may change to PO once tolerating orally.    Pain med as needed.  2, Cyclic vomiting: Last episode of vomiting was more than 24 hrs ago.     May advance diet as tolerated.     Antiemetic as needed.  3. [id number] glucose was elevated on admission,but this improved after given her regular home Lantus dose.     I suspect she did not take her medication prior to admission due to her vomiting and not eating.     Continue home dose of Lantus for now with SSI.  4. BP was elevated on admission,likely due to pain and not taking meds.     Restart home med.     Monitor  BP.

## 2013-08-18 NOTE — Progress Notes (Signed)
Family Medicine Teaching Service Daily Progress Note Intern Pager: [phone]  Patient name: [name] [name] Medical record number: [id number] Date of birth: 03-13-1966 Age: 47 y.o. Gender: female  Primary Care Provider: Conni Slipper, MD Consultants: none Code Status: Full  Pt Overview and Major Events to Date:  8/22 - Lipase 156 / NPO - 0.45 NS 150cc/hr / started Dilaudid 2mg  q 6, Ativan 1mg  q 8, Phenergan 25mg  q 6  8/23 - Switch from IV dilaudid to PO oxycodone, advance diet as tolerated   Assessment and Plan:  [name] [name] is a 47 y.o. year old female presenting with presenting with acute severe epigastric abdominal pain radiating up into her chest, for four days, worse for the last 24 hours. Pt has a history of recurrent cyclical vomiting syndrome (known trigger of marijuana, last used two weeks ago), GERD, poorly-controlled diabetes with secondary CKD stage III and neuropathy, depression, chronic back pain. Pt has numerous similar presentations to the ED, often requiring admission and has a history of poor compliance to outpt medication regimens. Symptoms not relieved in the ED with NS boluses, Dilaudid, Phenergan. Ordered for GI cocktail, Protonix IV, and repeat bolus prior to leaving the ED.   # Acute abdominal pain, epigastric - LUQ - Presumed pancreatitis  -DDx broad, acute exacerbation of cyclic vomiting, acute pancreatitis (lipase 156), ACS r/o from troponins and nml EKG - Advance Diet as tolerated today, D/C dilaudid and transition to PO oxycodone 15 mg q 3 hrs PRN  -continue Protonix 40mg  IV BID with plan to change to PO if tolerates diet [ ]  Consider advanced imaging (CT?) if worsens   - CXR, KUB negative  # DM with acute hyperglycemia - poor control as an outpt, glucose 559 in the ED  - continue home Lantus 10 units daily, with regular CBG checks and sensitive SSI, increase as needed [ ]  f/u CBGs   # Cyclic vomiting syndrome - possible contribution to above  symptoms  -management as above; will need to continue to reinforce 1) med compliance, 2) avoidance of marijuana   # CKD - Cr 1.95, baseline ~2  -aggressive hydration, as above  - Cr trending down to 1.95 from 2.8  # HTN  - BP stable, most likely secondary to pain/vomiting - continue lisinopril 40mg , Coreg 25mg  BID  # Hx of neuropathy, migraine - no current complaints; hold home Voltaren and Imitrex unless acute issues arise  # Hx of STI - reported hx of STI, concern that this may be contributing to abd pain? - pending GC / Chlam probe Urine - Consider BV/Trich and yeast vaginosis as well.  Will tx one time dose of Diflucan and will need speculum exam in outpt setting.   FEN/GI: NPO (sips/ice chips with meds), NS at 100 mL/h  Prophylaxis: PPI, subQ heparin  Disposition: Home pending clinical course  Subjective: Patient feeling a little better today, ready to advance diet and is not feeling nauseated or having vomiting.   Objective: Temp:  [98.5 F (36.9 C)-99 F (37.2 C)] 98.6 F (37 C) (08/23 0454) Pulse Rate:  [66-98] 66 (08/23 0454) Resp:  [18] 18 (08/23 0454) BP: (135-146)/(63-71) 135/63 mmHg (08/23 0454) SpO2:  [99 %-100 %] 99 % (08/23 0454) Weight:  [150 lb 5.7 oz (68.2 kg)] 150 lb 5.7 oz (68.2 kg) (08/23 0454) Physical Exam: General: NAD, asleep in bed HEENT: PERRLA, MMM Cardiovascular: tachycardic w/o appreciable murmur Respiratory: CTAB Abdomen: + minimal  epigastric TTP, No rebound or guarding, soft, no masses  palpated. Extremities: no edema, +2 peripheral pulses, non-tender, moves all Neuro - awake, alert, oriented, cooperative  Laboratory:  Recent Labs Lab 08/16/13 2238 08/17/13 0200 08/18/13 0525  WBC 17.4* 17.6* 9.3  HGB 12.3 12.3 10.3*  HCT 33.3* 33.5* 29.5*  PLT 248 231 191    Recent Labs Lab 08/16/13 2238 08/17/13 0120 08/18/13 0525  NA 127* 129* 136  K 3.2* 3.7 3.7  CL 79* 87* 100  CO2 28 24 25   BUN 53* 49* 30*  CREATININE 2.80*  2.38* 1.95*  CALCIUM 9.4 8.6 9.1  PROT 8.1  --   --   BILITOT 0.9  --   --   ALKPHOS 111  --   --   ALT 10  --   --   AST 18  --   --   GLUCOSE 559* 465* 141*   Troponins - negative x2  8/21 UA - negative leuks / nitrites / no WBCs / rare bacteria - >1000 gluc, 15 ketones, protein 100  8/21 Lipase - 156  8/22 Blood cultures x2 - pending  Imaging/Diagnostic Tests:  8/21 2v CXR - Negative 8/22 KUB - Negative  [name] Rod, DO 08/18/2013, 9:09 AM PGY-2, [place] Intern pager: [phone], text pages welcome

## 2013-08-18 NOTE — Progress Notes (Addendum)
INITIAL NUTRITION ASSESSMENT  DOCUMENTATION CODES Per approved criteria  -Not Applicable   INTERVENTION: 1.  Modify diet; continue to advance diet as tolerated.  Pt reporting hunger. 2.  Supplements; Resource Breeze po BID, each supplement provides 250 kcal and 9 grams of protein.   NUTRITION DIAGNOSIS: Inadequate oral intake related to nausea/vomiting as evidenced by pt report.   Monitor:  1.  Food/Beverage; diet advancement with pt meeting >/=90% estimated needs with tolerance. 2.  Wt/wt change; monitor trends  Reason for Assessment: MST  47 y.o. female  Admitting Dx: Abdominal pain, epigastric  ASSESSMENT: Pt admitted with nausea and vomiting.  Pt has recurrent cyclic vomiting.  She has been found to meet acute malnutrition diagnostic criteria with previous episode.  Pt reports poor intake for 5 days r/t vomiting.  She states she is "starving" and would like something to eat.  She denies N/V today.  RD obtained new wt at bedside- 157 lbs. Her wt has been stable. Note elevate pancreatic enzymes.  Nutrition Focused Physical Exam:  Subcutaneous Fat:  Orbital Region: WNL Upper Arm Region: WNL Thoracic and Lumbar Region: WNL  Muscle:  Temple Region: WNL Clavicle Bone Region: WNL Clavicle and Acromion Bone Region: WNL Scapular Bone Region: WNL Dorsal Hand: WNL Patellar Region: WNL Anterior Thigh Region: WNL Posterior Calf Region: WNL  Edema: none  Height: Ht Readings from Last 1 Encounters:  08/17/13 5' 1.5" (1.562 m)    Weight: Wt Readings from Last 1 Encounters:  08/18/13 150 lb 5.7 oz (68.2 kg)    Ideal Body Weight: 105 lbs  % Ideal Body Weight: 142%  Wt Readings from Last 10 Encounters:  08/18/13 150 lb 5.7 oz (68.2 kg)  07/25/13 154 lb (69.854 kg)  07/13/13 151 lb (68.493 kg)  07/11/13 146 lb (66.225 kg)  06/25/13 160 lb 0.9 oz (72.6 kg)  06/20/13 160 lb (72.576 kg)  06/06/13 162 lb (73.483 kg)  05/16/13 169 lb (76.658 kg)  04/24/13 166 lb  (75.297 kg)  04/19/13 154 lb (69.854 kg)    Usual Body Weight: 155 lbs  % Usual Body Weight: 100%  BMI:  Body mass index is 27.95 kg/(m^2).  Estimated Nutritional Needs: Kcal: 1750-1900 Protein: 65-80g Fluid: ~1.8 L/day  Skin: intact  Diet Order: Clear Liquid  EDUCATION NEEDS: -Education needs addressed   Intake/Output Summary (Last 24 hours) at 08/18/13 1558 Last data filed at 08/18/13 0700  Gross per 24 hour  Intake   1310 ml  Output      0 ml  Net   1310 ml    Last BM: 8/20  Labs:   Recent Labs Lab 08/16/13 2238 08/17/13 0120 08/18/13 0525  NA 127* 129* 136  K 3.2* 3.7 3.7  CL 79* 87* 100  CO2 28 24 25   BUN 53* 49* 30*  CREATININE 2.80* 2.38* 1.95*  CALCIUM 9.4 8.6 9.1  GLUCOSE 559* 465* 141*    CBG (last 3)   Recent Labs  08/17/13 2101 08/18/13 0623 08/18/13 1105  GLUCAP 130* 137* 183*    Scheduled Meds: . carvedilol  25 mg Oral BID WC  . heparin  5,000 Units Subcutaneous Q8H  . insulin aspart  0-9 Units Subcutaneous TID WC  . insulin glargine  10 Units Subcutaneous Daily  . lisinopril  40 mg Oral q morning - 10a  . LORazepam  1 mg Intravenous Q8H  . pantoprazole (PROTONIX) IV  40 mg Intravenous Q12H  . promethazine  25 mg Intravenous Q6H  . sodium chloride  1,000 mL Intravenous Once  . sodium chloride  3 mL Intravenous Q12H    Continuous Infusions: . sodium chloride 100 mL/hr at 08/18/13 1100    Past Medical History  Diagnosis Date  . PANCREATITIS 05/09/2008  . Cyclic vomiting syndrome   . Hypertension   . Complication of anesthesia     "problems waking up"  . Heart murmur   . Myocardial infarction 07/2007    "they say I've had a silent one; I don't know"  . Pneumonia   . Anemia   . Blood transfusion   . GERD (gastroesophageal reflux disease)   . Anxiety   . Depression   . Smoker   . Marijuana smoker, continuous   . NEUROPATHY, DIABETIC 02/23/2007  . Type II diabetes mellitus   . Diabetic gastroparesis     /e-chart     Past Surgical History  Procedure Laterality Date  . Port-a-cath placement  ~ 2008    right chest; "poor access; frequent hospitalizations"  . Laparotomy and lysis of adhesions    . Abdominal hysterectomy  02/2002  . Cholecystectomy  1980's  . Cardiac catheterization    . Dilation and evacuation  X3  . Left hand surgery    . Cataract extraction w/ intraocular lens  implant, bilateral    . Colonoscopy    . Upper gi endoscopy      Brynda Greathouse, MS RD LDN Clinical Inpatient Dietitian Pager: [phone] Weekend/After hours pager: [phone]

## 2013-08-18 NOTE — Progress Notes (Signed)
Received a page that the patient was experiencing a delirious episode. Nurse expressed to me that patient was telling the tech to feed the cat and get the drugs out of the attic. I saw the patient at bedside. She told me that she dreams out loud sometimes and that's what she attested the episode to. She reported her last use of marijuana was last week. Upon exam she was alert and oriented to place, day of week, and year.    Clearance Coots, MD  PGY-1

## 2013-08-19 DIAGNOSIS — R109 Unspecified abdominal pain: Secondary | ICD-10-CM | POA: Diagnosis not present

## 2013-08-19 DIAGNOSIS — R1115 Cyclical vomiting syndrome unrelated to migraine: Secondary | ICD-10-CM | POA: Diagnosis not present

## 2013-08-19 LAB — GLUCOSE, CAPILLARY
Glucose-Capillary: 254 mg/dL — ABNORMAL HIGH (ref 70–99)
Glucose-Capillary: 183 mg/dL — ABNORMAL HIGH (ref 70–99)

## 2013-08-19 LAB — BASIC METABOLIC PANEL
BUN: 17 mg/dL (ref 6–23)
Creatinine, Ser: 1.77 mg/dL — ABNORMAL HIGH (ref 0.50–1.10)
GFR calc non Af Amer: 33 mL/min — ABNORMAL LOW (ref >90)
Glucose, Bld: 171 mg/dL — ABNORMAL HIGH (ref 70–99)
Potassium: 3.9 mEq/L (ref 3.5–5.1)

## 2013-08-19 LAB — GC/CHLAMYDIA PROBE AMP: CT Probe RNA: NEGATIVE

## 2013-08-19 LAB — CBC
HCT: 27.6 % — ABNORMAL LOW (ref 36.0–46.0)
Hemoglobin: 9.7 g/dL — ABNORMAL LOW (ref 12.0–15.0)
MCH: 31.6 pg (ref 26.0–34.0)
MCHC: 35.1 g/dL (ref 30.0–36.0)
RDW: 13.3 % (ref 11.5–15.5)

## 2013-08-19 MED ORDER — SODIUM CHLORIDE 0.9 % IJ SOLN
10.0000 mL | INTRAMUSCULAR | Status: DC | PRN
  Administered 2013-08-20: 10 mL

## 2013-08-19 MED ORDER — PANTOPRAZOLE SODIUM 40 MG PO TBEC
40.0000 mg | DELAYED_RELEASE_TABLET | Freq: Two times a day (BID) | ORAL | Status: DC
  Administered 2013-08-19 – 2013-08-20 (×3): 40 mg via ORAL
  Filled 2013-08-19 (×3): qty 1

## 2013-08-19 MED ORDER — DIPHENHYDRAMINE HCL 25 MG PO CAPS
25.0000 mg | ORAL_CAPSULE | Freq: Four times a day (QID) | ORAL | Status: DC | PRN
  Administered 2013-08-19 – 2013-08-20 (×2): 25 mg via ORAL
  Filled 2013-08-19 (×2): qty 1

## 2013-08-19 NOTE — Progress Notes (Signed)
FMTS Attending Admission Note: [name] Burkes,MD I  have seen and examined this patient, reviewed their chart. I have discussed this patient with the resident. I agree with the resident's findings, assessment and care plan.  

## 2013-08-19 NOTE — Progress Notes (Signed)
Family Medicine Teaching Service Daily Progress Note Intern Pager: [phone]  Patient name: [name] [name] Medical record number: [id number] Date of birth: [birth year] Age: 47 y.o. Gender: female  Primary Care Provider: Conni Slipper, MD Consultants: none Code Status: Full  Pt Overview and Major Events to Date:  8/22 - Lipase 156 / NPO - 0.45 NS 150cc/hr / started Dilaudid 2mg  q 6, Ativan 1mg  q 8, Phenergan 25mg  q 6 8/23 - Switch from IV dilaudid to PO oxycodone, advance diet as tolerated 8/24 - pain improved / persistent nausea / start soft diet  Assessment and Plan:  [name] [name] is a 47 y.o. year old female presenting with presenting with acute severe epigastric abdominal pain radiating up into her chest, for four days, worse for the last 24 hours. Pt has a history of recurrent cyclical vomiting syndrome (known trigger of marijuana, last used two weeks ago), GERD, poorly-controlled diabetes with secondary CKD stage III and neuropathy, depression, chronic back pain. Pt has numerous similar presentations to the ED, often requiring admission and has a history of poor compliance to outpt medication regimens. Symptoms not relieved in the ED with NS boluses, Dilaudid, Phenergan. Ordered for GI cocktail, Protonix IV, and repeat bolus prior to leaving the ED.   # Acute abdominal pain, epigastric - LUQ - Presumed pancreatitis  -DDx broad, acute exacerbation of cyclic vomiting, acute pancreatitis (lipase 156), ACS r/o from troponins and nml EKG - continue PO oxycodone 15 mg q 3 hrs PRN (DC'd Dilaudid IV 8/23) - switch Protonix 40mg  to POD BID (from IV) as advancing diet - advanced diet to Mechanical Soft (breakfast, and advance as tolerated) [ ]  Consider advanced imaging (CT?) if worsens   - CXR, KUB negative   # DM with acute hyperglycemia - poor control as an outpt, glucose 559 in the ED  - continue home Lantus 10 units daily, with regular CBG checks and sensitive SSI, increase as  needed [ ]  f/u CBGs - [id number]   # Cyclic vomiting syndrome - possible contribution to above symptoms  -management as above; will need to continue to reinforce 1) med compliance, 2) avoidance of marijuana - No vomiting episode since in hospital - persistent Nausea with mild improvement   # CKD - Cr 1.95, baseline ~2  -aggressive hydration, as above  - Cr trending down to 1.95 from 2.8 - f/u BMET  # HTN  - BP stable, most likely secondary to pain/vomiting - continue lisinopril 40mg , Coreg 25mg  BID  # Hx of neuropathy, migraine - no current complaints; hold home Voltaren and Imitrex unless acute issues arise  # Hx of STI - reported hx of STI, concern that this may be contributing to abd pain? - (8/22) pending GC / Chlam probe Urine - Consider BV/Trich and yeast vaginosis as well.  Will tx one time dose of Diflucan [ ]  f/u outpatient - will need speculum exam and wet prep  FEN/GI: full liquid diet --> advance to mechanical soft this AM (sips/ice chips with meds), NS at 100 mL/h  Prophylaxis: PPI, subQ heparin  Disposition: Home pending clinical course  Subjective: Patient continues to feel better today, although still persistent nausea. No vomiting. Abdominal pain is about half of what it was when she came in was >10/10 now 5/10, same location (central, epigastric, LUQ) w/o radiation. She is hungry, and ready to advance diet from full liquid to soft this morning, and possibly further advanced for lunch.  Objective: Temp:  [98.6 F (37 C)-99.1 F (  37.3 C)] 99.1 F (37.3 C) (08/24 0601) Pulse Rate:  [66-86] 66 (08/24 0601) Resp:  [18] 18 (08/24 0601) BP: (125-160)/(75-90) 136/90 mmHg (08/24 0601) SpO2:  [97 %-100 %] 98 % (08/24 0601) Weight:  [155 lb 8 oz (70.534 kg)] 155 lb 8 oz (70.534 kg) (08/24 0601) Physical Exam: General: sitting up in bed, NAD HEENT: PERRLA, MMM Cardiovascular: RRR, w/o appreciable murmur Respiratory: CTAB Abdomen: + minimal  epigastric TTP (improved  today), No rebound or guarding, soft, no masses palpated. Extremities: no edema, +2 peripheral pulses, non-tender, moves all Neuro - awake, alert, oriented, cooperative  Laboratory:  Recent Labs Lab 08/17/13 0200 08/18/13 0525 08/19/13 0830  WBC 17.6* 9.3 6.0  HGB 12.3 10.3* 9.7*  HCT 33.5* 29.5* 27.6*  PLT 231 191 168    Recent Labs Lab 08/16/13 2238 08/17/13 0120 08/18/13 0525 08/19/13 0830  NA 127* 129* 136 139  K 3.2* 3.7 3.7 3.9  CL 79* 87* 100 107  CO2 28 24 25 26   BUN 53* 49* 30* 17  CREATININE 2.80* 2.38* 1.95* 1.77*  CALCIUM 9.4 8.6 9.1 8.8  PROT 8.1  --   --   --   BILITOT 0.9  --   --   --   ALKPHOS 111  --   --   --   ALT 10  --   --   --   AST 18  --   --   --   GLUCOSE 559* 465* 141* 171*   Troponins - negative x2  8/21 UA - negative leuks / nitrites / no WBCs / rare bacteria - >1000 gluc, 15 ketones, protein 100  8/21 Lipase - 156  8/22 Blood cultures x2 - pending 8/22 GC/Chlam urine probe - pending  Imaging/Diagnostic Tests:  8/21 2v CXR - Negative 8/22 KUB - Negative  [name] Putnam, DO 08/19/2013, 10:17 AM PGY-1, [place] Intern pager: [phone], text pages welcome

## 2013-08-20 LAB — GLUCOSE, CAPILLARY
Glucose-Capillary: 218 mg/dL — ABNORMAL HIGH (ref 70–99)
Glucose-Capillary: 77 mg/dL (ref 70–99)

## 2013-08-20 LAB — BASIC METABOLIC PANEL
Calcium: 8.8 mg/dL (ref 8.4–10.5)
GFR calc non Af Amer: 35 mL/min — ABNORMAL LOW (ref >90)
Potassium: 4 mEq/L (ref 3.5–5.1)
Sodium: 138 mEq/L (ref 135–145)

## 2013-08-20 MED ORDER — FLUCONAZOLE 150 MG PO TABS
150.0000 mg | ORAL_TABLET | Freq: Once | ORAL | Status: AC
  Administered 2013-08-20: 150 mg via ORAL
  Filled 2013-08-20: qty 1

## 2013-08-20 MED ORDER — HEPARIN SOD (PORK) LOCK FLUSH 100 UNIT/ML IV SOLN
500.0000 [IU] | INTRAVENOUS | Status: AC | PRN
  Administered 2013-08-20: 500 [IU]

## 2013-08-20 MED ORDER — LORAZEPAM 1 MG PO TABS: 1.0000 mg | ORAL_TABLET | Freq: Four times a day (QID) | ORAL | Status: DC | PRN

## 2013-08-20 MED ORDER — PROMETHAZINE HCL 12.5 MG PO TABS: 25.0000 mg | ORAL_TABLET | ORAL | Status: DC | PRN

## 2013-08-20 MED ORDER — OXYCODONE HCL 5 MG PO TABS: 5.0000 mg | ORAL_TABLET | Freq: Three times a day (TID) | ORAL | Status: DC | PRN

## 2013-08-20 NOTE — Discharge Summary (Signed)
[facility] Discharge Summary  Patient name: [name] [name] Medical record number: [id number] Date of birth: 1966-10-13 Age: 47 y.o. Gender: female Date of Admission: 08/16/2013  Date of Discharge: 08/20/2013 Admitting Physician: Blane Ohara McDiarmid, MD  Primary Care Provider: Conni Slipper, MD Consultants: none  Indication for Hospitalization: Abdominal Pain, with radiation to Chest, Nausea and Vomiting  Discharge Diagnoses/Problem List:  Abdominal pain, acute epigastric presumed secondary to acute pancreatitis - resolved Vomiting, secondary to cyclical vomiting syndrome - resolved Chest pain, ruled out ACS - resolved History of marijuana use, likely trigger for cyclical vomiting Vaginal Discharge, suspected vaginal candidiasis DM Acute Kidney Injury - resolved CKD, Stage III HTN GERD  Disposition: Home  Discharge Condition: Stable  Brief Hospital Course:  [name] [name] is a 47 y.o. year old female presenting with presenting with acute severe epigastric abdominal pain radiating up into her chest, for four days, worse for the last 24 hours. Pt has a history of recurrent cyclical vomiting syndrome (known trigger of marijuana, last used two weeks ago), GERD, poorly-controlled diabetes with secondary CKD stage III and neuropathy, depression, chronic back pain. Pt has numerous similar presentations to the ED, often requiring admission and has a history of poor compliance to outpt medication regimens. Symptoms not relieved in the ED with NS boluses, Dilaudid, Phenergan. Ordered for GI cocktail, Protonix IV, and repeat bolus prior to leaving the ED.  # Abdominal pain, acute epigastric / LUQ - presumed secondary to acute pancreatitis - resolved Presented with 4 day worsening history of epigastric abdominal pain, nausea, vomiting, similar to previous hospitalizations. Due to some radiation of abd pain into chest, patient was ruled out for ACS (negative  troponins, nml EKG). Initial work-up with elevated WBC (17.4) and Lipase (156), negative CXR, KUB, LFTs, UA. Concern for possible acute pancreatitis episode, patient NPO, pain and nausea controlled. Slowly advanced diet, and overall improved over next few days. No further imaging needed. Transitioned to PO pain control with Oxy IR, and discharged with short term prescriptions for Oxy IR 5mg  q 8 PRN, Ativan 1mg  q 6 PRN, Phenergan 25mg  q 4 PRN, and close follow-up in clinic.   # Cyclic vomiting syndrome - improved Hx of similar episodes in past. Per records and history, seems related trigger is marijuana use preceding symptoms. Likely contributed to worsening symptoms of this hospitalization. During hospitalization significant improvement in vomiting episodes, with very few reported after first 24 hours. Advanced diet as above. Controlled on Zofran / Phenergan. Discussed importance of avoiding marijuana, as it is the likely trigger. Discharged with Phenergan for nausea.  # DM with acute hyperglycemia - improved HgbA1c 9.9 on presentation. Poor control as an outpt, glucose 559 in the ED. Responded dramatically to 10u Novolog correction, restarted home Lantus 10u daily, Sensitive SSI. Overall, minimal SSI correction needed, and likely explanation is non-compliance with insulin therapy at home. Follow up DM management in clinic.  # Vaginal Discharge, suspected vaginal candidiasis  Reported hx of STI including recent treatment for Trichomonas (reported partner was not treated), concern that this may be contributing to abd pain. GC / Chlam probe Urine was negative. Consider BV or yeast vaginosis as well due to description of white thick discharge with itching. Treated with Diflucan 150mg  x1 dose, and a repeat 150mg  dose 2 days later. If vaginal discharge does not resolve, then planned for patient to receive a pelvic exam with wet prep in clinic Thursday for outpatient follow-up.  # Acute on Chronic Kidney  Disease (CKD - Stage III) - improved Baseline creatinine around 2.0, presented with elevated Cr 2.80, which improved with aggressive fluid hydration. Continued to trend down to 1.70 on discharge.  # HTN  BP stable, most likely secondary to pain/vomiting. Continued Coreg 25mg  BID, and restarted Lisinopril 40mg  daily. Would encourage follow-up regarding specific BP agents for this patient in future.  # Hx of neuropathy, migraine - no current complaints; hold home Voltaren and Imitrex unless acute issues arise  Issues for Follow Up:  1. Resolution of Abdominal Pain / Symptoms - Follow-up in clinic to determine if resolved, and controlled at home. Patient reports that these symptoms have brought her back to hospital multiple times, and if they can be controlled then she would prefer to not have to keep going back. Given short term Rx (Oxy, Ativan, Phenergan) to follow-up in clinic.  2. Avoidance of Triggers - Patient is aware that marijuana is believed to be a trigger for cyclical vomiting and abdominal pain. Continue to encourage avoidance.  3. Vaginal Discharge - Reported thick white vaginal discharge with itching consistent with prior yeast infections, treated with Diflucan 150mg  x 2 doses. If not resolved, then recommend pelvic exam and wet prep.  4. Compliance with DM Management - Review DM medications and encourage compliance.  Significant Procedures: none  Significant Labs and Imaging:   Recent Labs Lab 08/17/13 0200 08/18/13 0525 08/19/13 0830  WBC 17.6* 9.3 6.0  HGB 12.3 10.3* 9.7*  HCT 33.5* 29.5* 27.6*  PLT 231 191 168    Recent Labs Lab 08/16/13 2238 08/17/13 0120 08/18/13 0525 08/19/13 0830 08/20/13 1240  NA 127* 129* 136 139 138  K 3.2* 3.7 3.7 3.9 4.0  CL 79* 87* 100 107 104  CO2 28 24 25 26 26   GLUCOSE 559* 465* 141* 171* 224*  BUN 53* 49* 30* 17 11  CREATININE 2.80* 2.38* 1.95* 1.77* 1.70*  CALCIUM 9.4 8.6 9.1 8.8 8.8  ALKPHOS 111  --   --   --   --   AST  18  --   --   --   --   ALT 10  --   --   --   --   ALBUMIN 4.4  --   --   --   --    Troponins - negative x2  8/21 UA - negative leuks / nitrites / no WBCs / rare bacteria - >1000 gluc, 15 ketones, protein 100   8/21 Lipase - 156  8/22 Blood cultures x2 - NGTD  8/22 GC/Chlam urine probe - Negative  Imaging/Diagnostic Tests:  8/21 2v CXR - Negative  8/22 KUB - Negative   Results/Tests Pending at Time of Discharge:  8/22 Blood cultures x2 - NGTD   Discharge Medications:    Medication List         aspirin EC 81 MG tablet  Take 81 mg by mouth every morning.     carvedilol 25 MG tablet  Commonly known as:  COREG  Take 25 mg by mouth 2 (two) times daily with a meal.     diclofenac sodium 1 % Gel  Commonly known as:  VOLTAREN  Apply 2 g topically 4 (four) times daily as needed (pain).     insulin glargine 100 UNIT/ML injection  Commonly known as:  LANTUS  Inject 20 Units into the skin every morning.     lisinopril 40 MG tablet  Commonly known as:  PRINIVIL,ZESTRIL  Take 40 mg by mouth every morning.  LORazepam 1 MG tablet  Commonly known as:  ATIVAN  Take 1 tablet (1 mg total) by mouth every 6 (six) hours as needed for anxiety.     oxyCODONE 5 MG immediate release tablet  Commonly known as:  ROXICODONE  Take 1 tablet (5 mg total) by mouth every 8 (eight) hours as needed for pain.     promethazine 12.5 MG tablet  Commonly known as:  PHENERGAN  Take 2 tablets (25 mg total) by mouth every 4 (four) hours as needed for nausea.     SUMAtriptan 50 MG tablet  Commonly known as:  IMITREX  Take 50 mg by mouth every 2 (two) hours as needed for migraine.        Discharge Instructions: Please refer to Patient Instructions section of EMR for full details.  Patient was counseled important signs and symptoms that should prompt return to medical care, changes in medications, dietary instructions, activity restrictions, and follow up appointments.   Follow-Up  Appointments:     Follow-up Information   Follow up with Conni Slipper, MD On 08/23/2013. (already scheduled for 8/28 at 2:15pm)    Specialty:  Family Medicine   Contact information:   [place] [id number] [place], DO 08/20/2013, 7:35 PM PGY-1, [place]

## 2013-08-20 NOTE — Progress Notes (Signed)
Family Medicine Teaching Service Daily Progress Note Intern Pager: [phone]  Patient name: DEVANY PAULMAN Medical record number: [id number] Date of birth: 1966-09-16 Age: 47 y.o. Gender: female  Primary Care Provider: Conni Slipper, MD Consultants: none Code Status: Full  Pt Overview and Major Events to Date:  8/22 - Lipase 156 / NPO - 0.45 NS 150cc/hr / started Dilaudid 2mg  q 6, Ativan 1mg  q 8, Phenergan 25mg  q 6 8/23 - Switch from IV dilaudid to PO oxycodone, advance diet as tolerated 8/24 - pain improved / persistent nausea / start soft diet 8/25 - continued improvement in abd pain, nausea / tolerating soft diet well / ready for discharge  Assessment and Plan:  TANYRA POTTINGER is a 47 y.o. year old female presenting with presenting with acute severe epigastric abdominal pain radiating up into her chest, for four days, worse for the last 24 hours. Pt has a history of recurrent cyclical vomiting syndrome (known trigger of marijuana, last used two weeks ago), GERD, poorly-controlled diabetes with secondary CKD stage III and neuropathy, depression, chronic back pain. Pt has numerous similar presentations to the ED, often requiring admission and has a history of poor compliance to outpt medication regimens. Symptoms not relieved in the ED with NS boluses, Dilaudid, Phenergan. Ordered for GI cocktail, Protonix IV, and repeat bolus prior to leaving the ED.   # Acute abdominal pain, epigastric - LUQ - Presumed pancreatitis  -DDx broad, acute exacerbation of cyclic vomiting, acute pancreatitis (lipase 156), ACS r/o from troponins and nml EKG. KUB negative. Low Ranson Criteria score (decreased severity pancreatitis) - Considered alternative imaging (CT abd) if worsening, but patient has continued to improve - continue PO oxycodone 15 mg q 3 hrs PRN (DC'd Dilaudid IV 8/23) - switch Protonix 40mg  to POD BID (from IV) as advancing diet - tolerated Mechanical Soft diet for 24 hours - plans to  advance at home as tolerated - pain improved today, will discharge on home (short term Rx) Oxy IR 10mg  q 6 PRN with close clinic follow-up outpatient  # DM with acute hyperglycemia - poor control as an outpt, glucose 559 in the ED  - continue home Lantus 10 units daily, with regular CBG checks and sensitive SSI, increase as needed [ ]  f/u CBGs - 200-250 (peak [id number])  # Cyclic vomiting syndrome - possible contribution to above symptoms  -management as above; will need to continue to reinforce 1) med compliance, 2) avoidance of marijuana - No vomiting episode since in hospital - significant improvement today (decreased nausea) - plan to discharge with rx of Phenergan   # CKD - Cr 1.95, baseline ~2  -aggressive hydration, as above  - Cr trending down to 1.70 (1.95, 2.8) - f/u BMET  # HTN  - BP stable, most likely secondary to pain/vomiting - continue lisinopril 40mg , Coreg 25mg  BID  # Hx of neuropathy, migraine - no current complaints; hold home Voltaren and Imitrex unless acute issues arise  # Vaginal Discharge, suspected vaginal candidiasis - reported hx of STI, concern that this may be contributing to abd pain? - (8/22) GC / Chlam probe Urine - NEGATIVE - Consider BV/Trich and yeast vaginosis as well. Recent reported history of treatment for Trichomonas. - already received Diflucan 150mg  x1 dose (on 8/23) - plan to give repeat Diflucan 150mg  today to complete course. Recommend close follow-up outpatient within 1 week if vaginal discharge does not clear up, and will likely need Pelvic Exam with wet prep as outpatient in clinic.  FEN/GI: tolerating mechanical soft diet well / NS at 100 mL/h  Prophylaxis: PPI, subQ heparin  Disposition: Home pending clinical course  Subjective: Patient feeling better today. States that she is ready to go home. She has tolerated soft diet well for 24 hours. Significant decreased abdominal pain (3-4/10, vs initially >10/10). Reports decreased nausea, no  vomiting episodes. Requests to be resumed on home medications of Oxy IR 10mg  q 6 PRN, Ativan 1mg  q 6 PRN, Phenergan 25mg  q 4 PRN. Also, concerned about vaginal discharge described as thick white with itching. Received one dose of Diflucan, agreeable to 1x more dose, and plans to follow-up in [facility] later this week.  Objective: Temp:  [98.3 F (36.8 C)-99.4 F (37.4 C)] 98.3 F (36.8 C) (08/25 1441) Pulse Rate:  [62-77] 62 (08/25 1441) Resp:  [16-18] 18 (08/25 1441) BP: (143-170)/(69-82) 158/82 mmHg (08/25 1441) SpO2:  [100 %] 100 % (08/25 1441) Weight:  [160 lb 9.6 oz (72.848 kg)] 160 lb 9.6 oz (72.848 kg) (08/25 0413) Physical Exam: General: sitting up in bed, NAD HEENT: PERRLA, MMM Cardiovascular: RRR, w/o appreciable murmur Respiratory: CTAB Abdomen: +mild epigastric TTP (continued improvement today), No rebound or guarding, soft, no masses palpated. Extremities: no edema, +2 peripheral pulses, non-tender, moves all Neuro - awake, alert, oriented, cooperative  Laboratory:  Recent Labs Lab 08/17/13 0200 08/18/13 0525 08/19/13 0830  WBC 17.6* 9.3 6.0  HGB 12.3 10.3* 9.7*  HCT 33.5* 29.5* 27.6*  PLT 231 191 168    Recent Labs Lab 08/16/13 2238  08/18/13 0525 08/19/13 0830 08/20/13 1240  NA 127*  < > 136 139 138  K 3.2*  < > 3.7 3.9 4.0  CL 79*  < > 100 107 104  CO2 28  < > 25 26 26   BUN 53*  < > 30* 17 11  CREATININE 2.80*  < > 1.95* 1.77* 1.70*  CALCIUM 9.4  < > 9.1 8.8 8.8  PROT 8.1  --   --   --   --   BILITOT 0.9  --   --   --   --   ALKPHOS 111  --   --   --   --   ALT 10  --   --   --   --   AST 18  --   --   --   --   GLUCOSE 559*  < > 141* 171* 224*  < > = values in this interval not displayed. Troponins - negative x2  8/21 UA - negative leuks / nitrites / no WBCs / rare bacteria - >1000 gluc, 15 ketones, protein 100  8/21 Lipase - 156  8/22 Blood cultures x2 - NGTD 8/22 GC/Chlam urine probe - Negative  Imaging/Diagnostic Tests:  8/21 2v CXR -  Negative 8/22 KUB - Negative  Nobie Putnam, DO 08/20/2013, 3:16 PM PGY-1, [place] Intern pager: [phone], text pages welcome

## 2013-08-20 NOTE — Progress Notes (Signed)
FMTS Attending Daily Note:  [name] MD  [phone] pager  Family Practice pager:  [phone] I have discussed this patient with the resident Dr. Parks Ranger.  I agree with their findings, assessment, and care plan

## 2013-08-20 NOTE — Progress Notes (Signed)
Pt was discharged home per MD order. Pt was alert and oriented at discharge and had no complaints of pain. Pt verbalized understanding of discharge teaching and was given prescriptions.Ileana Roup, Sianni Cloninger R, RN

## 2013-08-23 ENCOUNTER — Inpatient Hospital Stay: Payer: Medicare Other | Admitting: Family Medicine

## 2013-08-23 LAB — CULTURE, BLOOD (ROUTINE X 2): Culture: NO GROWTH

## 2013-08-25 NOTE — Discharge Summary (Signed)
Family Medicine Teaching Service  Discharge Note : Attending Jeff Alyaan Budzynski MD Pager [phone] Inpatient Team Pager:  [phone]  I have reviewed this patient and the patient's chart and have discussed discharge planning with the resident at the time of discharge. I agree with the discharge plan as above.    

## 2013-08-31 ENCOUNTER — Encounter: Payer: Self-pay | Admitting: Family Medicine

## 2013-08-31 ENCOUNTER — Ambulatory Visit (INDEPENDENT_AMBULATORY_CARE_PROVIDER_SITE_OTHER): Payer: Medicare Other | Admitting: Family Medicine

## 2013-08-31 VITALS — BP 117/69 | HR 80 | Temp 98.4°F | Wt 159.0 lb

## 2013-08-31 DIAGNOSIS — I10 Essential (primary) hypertension: Secondary | ICD-10-CM

## 2013-08-31 DIAGNOSIS — E119 Type 2 diabetes mellitus without complications: Secondary | ICD-10-CM | POA: Diagnosis not present

## 2013-08-31 MED ORDER — INSULIN GLARGINE 100 UNIT/ML SOLOSTAR PEN: PEN_INJECTOR | SUBCUTANEOUS | Status: DC

## 2013-08-31 MED ORDER — INSULIN GLARGINE 100 UNIT/ML ~~LOC~~ SOLN: 20.0000 [IU] | Freq: Every morning | SUBCUTANEOUS | Status: DC

## 2013-08-31 MED ORDER — LISINOPRIL 40 MG PO TABS: 40.0000 mg | ORAL_TABLET | Freq: Every morning | ORAL | Status: DC

## 2013-08-31 NOTE — Patient Instructions (Signed)

## 2013-08-31 NOTE — Progress Notes (Signed)
Subjective:     Patient ID: [name] [name], female   DOB: 11-Dec-1966, 47 y.o.   MRN: [id number]  HPI 47 y.o. F here for hospital F/u for cyclic vomiting and possible pancreatitis. Pt was hyperglycemic and hypertensive, and had vaginal discahrge. Pt had her insulin regimen modified to 22u lantus QHS. Pt has been having high range blood glucoses at home fasting.  Since d/c, pt has had persistent vaginal discharge that is improved from hospital admission after being tx for yeast infections.   Review of Systems  Constitutional: Negative for activity change and appetite change.  HENT: Negative for congestion and rhinorrhea.   Eyes: Negative for discharge.  Respiratory: Negative for chest tightness and shortness of breath.   Cardiovascular: Negative for chest pain, palpitations and leg swelling.  Gastrointestinal: Positive for abdominal pain (signficantly improved from discharge). Negative for abdominal distention.  Genitourinary: Positive for vaginal discharge. Negative for vaginal bleeding.  Neurological: Negative for dizziness and facial asymmetry.       Objective:   Physical Exam  Nursing note and vitals reviewed. Constitutional: She appears well-developed and well-nourished. No distress.  HENT:  Head: Normocephalic and atraumatic.  Cardiovascular: Normal rate, regular rhythm, normal heart sounds and intact distal pulses.  Exam reveals no gallop and no friction rub.   No murmur heard. Pulmonary/Chest: Effort normal and breath sounds normal. No respiratory distress. She has no wheezes. She has no rales. She exhibits no tenderness.  Abdominal: Soft. Bowel sounds are normal. She exhibits no distension and no mass. There is tenderness (mild mid epigastric pain). There is no rebound and no guarding.  Musculoskeletal: Normal range of motion.  Skin: She is not diaphoretic.       Assessment:     [name] [name] is a 47 y.o. F here for hospital follow up.     Plan:     Diabetes: Pt  reporting significantly elevated glucoses fasting. Will start on PM lantus 8Uqhs and will increase by 2U q2 days   For gluc >140 F/u 52month with sugar log and insulin chart.  - conintinue ACEi and ASA - eval for cholesterol, microalb/cr - Checking FS as recommended. (will return in 1 month with glucose log) - HgA1C >7 - Order the following screening tests: HgA1C, Lipids, Microalb/Cr, B+/LFTs at next visit - Patient aware that DM is CAD equiv - must optimize HLP/HTN.  - Discussed importance of healthy diet and exercise (5x/week, 20-30 minutes) - Follow up in 1 months or sooner if needed, call ahead for lab entry - Patient agrees with this plan  Vomiting resolved, mild epigastric pain. Improving  HTN Continue curretn regimen. BP well controlled today.  Vaginal discharge Improving, will reevaluate next month    Fredrik Rigger, MD [facility] Fellow

## 2013-09-03 ENCOUNTER — Telehealth: Payer: Self-pay | Admitting: Family Medicine

## 2013-09-03 NOTE — Telephone Encounter (Signed)
Pt was seen on Friday 9/5 and asked Dr. Leslie Andrea to write a letter stating that she has diabetes, High BP, and a vomiting disease so that she can get help paying a bill. She is also requesting refill ons ativan, lantus, and promethazine be sent to her pharmacy. She thought that Dr. Leslie Andrea would do that without asking. JW

## 2013-09-03 NOTE — Telephone Encounter (Signed)
Letter mailed to pt and will forward message to PCP.  Lantus was sent over to the pharmacy on Friday.  Message left for pt.  [name],CMA

## 2013-09-03 NOTE — Telephone Encounter (Signed)
Re: Lantus, this was just filled 9/5 and sent to patient's pharmacy on file [facility]).  Re: Phenergan and ativan, called to find out how many of each patient has left and how many currently needing daily, along with how her symptoms are now compared to hospitalization. Left message for pt to call clinic with this information before refills. If symptoms worsening, would need to see her often in clinic.  Hilton Sinclair, MD

## 2013-09-11 ENCOUNTER — Ambulatory Visit: Payer: Medicare Other | Admitting: Family Medicine

## 2013-09-11 ENCOUNTER — Telehealth: Payer: Self-pay | Admitting: *Deleted

## 2013-09-11 DIAGNOSIS — R079 Chest pain, unspecified: Secondary | ICD-10-CM | POA: Diagnosis not present

## 2013-09-11 NOTE — Telephone Encounter (Signed)
Patient called c/o chest pain and "doesn't feel good."  Has history of "cyclic vomiting syndrome" and doesn't want to wait for pain to worsen.  Informed patient no appts available this morning and she doesn't want to wait for work-in appt later today.  Option given to go to urgent care or ED this morning.  Patient will go to urgent care for eval and call back as needed.  Nolene Ebbs, RN

## 2013-09-14 ENCOUNTER — Encounter (HOSPITAL_COMMUNITY): Payer: Self-pay

## 2013-09-14 ENCOUNTER — Emergency Department (HOSPITAL_COMMUNITY)
Admission: EM | Admit: 2013-09-14 | Discharge: 2013-09-14 | Disposition: A | Discharge status: Home / Self Care | Payer: Medicare Other | Attending: Emergency Medicine | Admitting: Emergency Medicine

## 2013-09-14 DIAGNOSIS — K3184 Gastroparesis: Secondary | ICD-10-CM | POA: Insufficient documentation

## 2013-09-14 DIAGNOSIS — F17200 Nicotine dependence, unspecified, uncomplicated: Secondary | ICD-10-CM | POA: Diagnosis not present

## 2013-09-14 DIAGNOSIS — F411 Generalized anxiety disorder: Secondary | ICD-10-CM | POA: Insufficient documentation

## 2013-09-14 DIAGNOSIS — I252 Old myocardial infarction: Secondary | ICD-10-CM | POA: Insufficient documentation

## 2013-09-14 DIAGNOSIS — Z7982 Long term (current) use of aspirin: Secondary | ICD-10-CM | POA: Diagnosis not present

## 2013-09-14 DIAGNOSIS — E1142 Type 2 diabetes mellitus with diabetic polyneuropathy: Secondary | ICD-10-CM | POA: Insufficient documentation

## 2013-09-14 DIAGNOSIS — Z8701 Personal history of pneumonia (recurrent): Secondary | ICD-10-CM | POA: Diagnosis not present

## 2013-09-14 DIAGNOSIS — Z79899 Other long term (current) drug therapy: Secondary | ICD-10-CM | POA: Diagnosis not present

## 2013-09-14 DIAGNOSIS — R011 Cardiac murmur, unspecified: Secondary | ICD-10-CM | POA: Insufficient documentation

## 2013-09-14 DIAGNOSIS — I10 Essential (primary) hypertension: Secondary | ICD-10-CM | POA: Diagnosis not present

## 2013-09-14 DIAGNOSIS — Z794 Long term (current) use of insulin: Secondary | ICD-10-CM | POA: Diagnosis not present

## 2013-09-14 DIAGNOSIS — Z9889 Other specified postprocedural states: Secondary | ICD-10-CM | POA: Insufficient documentation

## 2013-09-14 DIAGNOSIS — R1084 Generalized abdominal pain: Secondary | ICD-10-CM | POA: Diagnosis not present

## 2013-09-14 DIAGNOSIS — R1110 Vomiting, unspecified: Secondary | ICD-10-CM

## 2013-09-14 DIAGNOSIS — E860 Dehydration: Secondary | ICD-10-CM | POA: Diagnosis not present

## 2013-09-14 DIAGNOSIS — R1115 Cyclical vomiting syndrome unrelated to migraine: Secondary | ICD-10-CM | POA: Diagnosis not present

## 2013-09-14 DIAGNOSIS — Z862 Personal history of diseases of the blood and blood-forming organs and certain disorders involving the immune mechanism: Secondary | ICD-10-CM | POA: Insufficient documentation

## 2013-09-14 DIAGNOSIS — E1149 Type 2 diabetes mellitus with other diabetic neurological complication: Secondary | ICD-10-CM | POA: Diagnosis not present

## 2013-09-14 LAB — COMPREHENSIVE METABOLIC PANEL
CO2: 23 mEq/L (ref 19–32)
Calcium: 10.1 mg/dL (ref 8.4–10.5)
Creatinine, Ser: 1.86 mg/dL — ABNORMAL HIGH (ref 0.50–1.10)
GFR calc Af Amer: 36 mL/min — ABNORMAL LOW (ref >90)
GFR calc non Af Amer: 31 mL/min — ABNORMAL LOW (ref >90)
Glucose, Bld: 134 mg/dL — ABNORMAL HIGH (ref 70–99)
Sodium: 140 mEq/L (ref 135–145)
Total Protein: 7.7 g/dL (ref 6.0–8.3)

## 2013-09-14 LAB — CBC WITH DIFFERENTIAL/PLATELET
Basophils Relative: 1 % (ref 0–1)
HCT: 27.7 % — ABNORMAL LOW (ref 36.0–46.0)
Hemoglobin: 9.7 g/dL — ABNORMAL LOW (ref 12.0–15.0)
Lymphocytes Relative: 43 % (ref 12–46)
Lymphs Abs: 2.8 10*3/uL (ref 0.7–4.0)
Monocytes Absolute: 0.5 10*3/uL (ref 0.1–1.0)
Monocytes Relative: 7 % (ref 3–12)
Neutro Abs: 3.1 10*3/uL (ref 1.7–7.7)
Neutrophils Relative %: 47 % (ref 43–77)
RBC: 3.05 MIL/uL — ABNORMAL LOW (ref 3.87–5.11)
WBC: 6.4 10*3/uL (ref 4.0–10.5)

## 2013-09-14 LAB — LIPASE, BLOOD: Lipase: 37 U/L (ref 11–59)

## 2013-09-14 MED ORDER — SODIUM CHLORIDE 0.9 % IV BOLUS (SEPSIS)
1000.0000 mL | Freq: Once | INTRAVENOUS | Status: AC
  Administered 2013-09-14: 1000 mL via INTRAVENOUS

## 2013-09-14 MED ORDER — LORAZEPAM 2 MG/ML IJ SOLN
1.0000 mg | Freq: Once | INTRAMUSCULAR | Status: AC
  Administered 2013-09-14: 1 mg via INTRAVENOUS
  Filled 2013-09-14: qty 1

## 2013-09-14 MED ORDER — LORAZEPAM 1 MG PO TABS: 1.0000 mg | ORAL_TABLET | Freq: Three times a day (TID) | ORAL | Status: DC | PRN

## 2013-09-14 MED ORDER — PROMETHAZINE HCL 25 MG RE SUPP: 25.0000 mg | Freq: Four times a day (QID) | RECTAL | Status: DC | PRN

## 2013-09-14 MED ORDER — HEPARIN SOD (PORK) LOCK FLUSH 100 UNIT/ML IV SOLN
500.0000 [IU] | Freq: Once | INTRAVENOUS | Status: AC
  Administered 2013-09-14: 500 [IU]
  Filled 2013-09-14: qty 5

## 2013-09-14 MED ORDER — ONDANSETRON 4 MG PO TBDP
8.0000 mg | ORAL_TABLET | Freq: Once | ORAL | Status: AC
  Administered 2013-09-14: 8 mg via ORAL
  Filled 2013-09-14: qty 2

## 2013-09-14 MED ORDER — SODIUM CHLORIDE 0.9 % IJ SOLN
10.0000 mL | INTRAMUSCULAR | Status: DC | PRN
  Administered 2013-09-14: 10 mL

## 2013-09-14 MED ORDER — PROMETHAZINE HCL 25 MG/ML IJ SOLN
12.5000 mg | Freq: Once | INTRAMUSCULAR | Status: AC
  Administered 2013-09-14: 12.5 mg via INTRAVENOUS

## 2013-09-14 MED ORDER — SODIUM CHLORIDE 0.9 % IJ SOLN: 10.0000 mL | Freq: Two times a day (BID) | INTRAMUSCULAR | Status: DC

## 2013-09-14 MED ORDER — PROMETHAZINE HCL 25 MG/ML IJ SOLN
25.0000 mg | Freq: Once | INTRAMUSCULAR | Status: DC
Start: 2013-09-14 — End: 2013-09-14
  Filled 2013-09-14: qty 1

## 2013-09-14 NOTE — ED Provider Notes (Signed)
Medical screening examination/treatment/procedure(s) were performed by non-physician practitioner and as supervising physician I was immediately available for consultation/collaboration.  Orlie Dakin, MD 09/14/13 2142

## 2013-09-14 NOTE — ED Notes (Signed)
Pt reports running out of her phenergan and ativan prescriptions last week and has been unable to make an appointment with her family practice physician.

## 2013-09-14 NOTE — ED Notes (Signed)
12.5 mg IV phenergan wasted with Marylou Flesher, RN.

## 2013-09-14 NOTE — ED Notes (Signed)
Pt presents to ED with vomiting starting yesterday, pt reports a hx of intermittent vomiting x11 years and was diagnosed with "cyclic vomiting syndrome." Pt also c/o upper abd pain which is also consistent with her symptoms

## 2013-09-14 NOTE — ED Notes (Signed)
IV team RN at bedside.  

## 2013-09-14 NOTE — ED Notes (Signed)
Pt resting. Pt tolerated fluids given well.

## 2013-09-14 NOTE — ED Provider Notes (Signed)
CSN: [id number]     Arrival date & time 09/14/13  1127 History   First MD Initiated Contact with Patient 09/14/13 1259     Chief Complaint  Patient presents with  . Emesis   (Consider location/radiation/quality/duration/timing/severity/associated sxs/prior Treatment) Patient is a 47 y.o. female presenting with vomiting. The history is provided by the patient. No language interpreter was used.  Emesis Severity:  Moderate Associated symptoms: abdominal pain   Associated symptoms: no chills, no diarrhea and no fever   Associated symptoms comment:  She has a history of Cyclic Vomiting Syndrome and reports running out of her medication one week ago. She is well controlled with Phenergan and Ativan but states she has not been given refills by her physician. No fever. She has generalized abdominal pain. No diarrhea, bloody bowel movements or hematemesis.    Past Medical History  Diagnosis Date  . PANCREATITIS 05/09/2008  . Cyclic vomiting syndrome   . Hypertension   . Complication of anesthesia     "problems waking up"  . Heart murmur   . Myocardial infarction 07/2007    "they say I've had a silent one; I don't know"  . Pneumonia   . Anemia   . Blood transfusion   . GERD (gastroesophageal reflux disease)   . Anxiety   . Depression   . Smoker   . Marijuana smoker, continuous   . NEUROPATHY, DIABETIC 02/23/2007  . Type II diabetes mellitus   . Diabetic gastroparesis     /e-chart   Past Surgical History  Procedure Laterality Date  . Port-a-cath placement  ~ 2008    right chest; "poor access; frequent hospitalizations"  . Laparotomy and lysis of adhesions    . Abdominal hysterectomy  02/2002  . Cholecystectomy  1980's  . Cardiac catheterization    . Dilation and evacuation  X3  . Left hand surgery    . Cataract extraction w/ intraocular lens  implant, bilateral    . Colonoscopy    . Upper gi endoscopy     Family History  Problem Relation Age of Onset  . Diabetes type II Mother    . Diabetes Mother   . Hypertension Mother   . Diabetes type II Sister   . Diabetes Sister   . Hypertension Sister   . Hypertension Brother    History  Substance Use Topics  . Smoking status: Current Some Day Smoker -- 0.50 packs/day for 22 years    Types: Cigarettes    Last Attempt to Quit: 02/22/2012  . Smokeless tobacco: Never Used  . Alcohol Use: No   OB History   Grav Para Term Preterm Abortions TAB SAB Ect Mult Living                 Review of Systems  Constitutional: Negative for fever and chills.  Respiratory: Negative.  Negative for cough and shortness of breath.   Cardiovascular: Negative.   Gastrointestinal: Positive for nausea, vomiting and abdominal pain. Negative for diarrhea and blood in stool.  Genitourinary: Negative.  Negative for dysuria.  Neurological: Negative.     Allergies  Bee venom; Acetaminophen; Novolog; and Erythromycin  Home Medications   Current Outpatient Rx  Name  Route  Sig  Dispense  Refill  . aspirin EC 81 MG tablet   Oral   Take 81 mg by mouth every morning.         . carvedilol (COREG) 25 MG tablet   Oral   Take 25 mg by mouth 2 (  two) times daily with a meal.         . diclofenac sodium (VOLTAREN) 1 % GEL   Topical   Apply 2 g topically 4 (four) times daily as needed (for pain).          . insulin glargine (LANTUS) 100 UNIT/ML injection   Subcutaneous   Inject 0.2 mLs (20 Units total) into the skin every morning.   10 mL   3   . lisinopril (PRINIVIL,ZESTRIL) 40 MG tablet   Oral   Take 1 tablet (40 mg total) by mouth every morning.   90 tablet   3   . LORazepam (ATIVAN) 1 MG tablet   Oral   Take 1 tablet (1 mg total) by mouth every 6 (six) hours as needed for anxiety.   30 tablet   0   . oxyCODONE (ROXICODONE) 5 MG immediate release tablet   Oral   Take 1 tablet (5 mg total) by mouth every 8 (eight) hours as needed for pain.   15 tablet   0   . promethazine (PHENERGAN) 12.5 MG tablet   Oral   Take 2  tablets (25 mg total) by mouth every 4 (four) hours as needed for nausea.   30 tablet   0   . SUMAtriptan (IMITREX) 50 MG tablet   Oral   Take 50 mg by mouth every 2 (two) hours as needed for migraine.           BP 180/98  Pulse 97  Temp(Src) 98.5 F (36.9 C) (Oral)  Resp 14  SpO2 100% Physical Exam  Constitutional: She is oriented to person, place, and time. She appears well-developed and well-nourished. No distress.  Patient uncomfortable in appearance.   HENT:  Head: Normocephalic.  Neck: Normal range of motion. Neck supple.  Cardiovascular: Normal rate and regular rhythm.   Pulmonary/Chest: Effort normal and breath sounds normal.  Abdominal: Soft. Bowel sounds are normal. There is tenderness. There is no rebound and no guarding.  Diffusely tender throughout soft abdomen.  Musculoskeletal: Normal range of motion.  Neurological: She is alert and oriented to person, place, and time.  Skin: Skin is warm and dry. No rash noted.  Psychiatric: She has a normal mood and affect.    ED Course  Procedures (including critical care time) Labs Review Labs Reviewed  CBC WITH DIFFERENTIAL - Abnormal; Notable for the following:    RBC 3.05 (*)    Hemoglobin 9.7 (*)    HCT 27.7 (*)    All other components within normal limits  LIPASE, BLOOD  COMPREHENSIVE METABOLIC PANEL   Imaging Review No results found.  MDM  No diagnosis found. 1. Nausea with vomiting 2. H/o cyclic vomiting syndrome  She received IV fluids for rehydration. Lab studies are unremarkable. Baseline renal function with Cr 1.86. Phenergan and Ativan given in ED with resolution of symptoms. She is tolerating PO fluids without vomiting or further pain. Plan to discharge home with Rx for same medications. VSS.    Dewaine Oats, PA-C 09/14/13 1621

## 2013-09-26 ENCOUNTER — Emergency Department (HOSPITAL_COMMUNITY)
Admission: EM | Admit: 2013-09-26 | Discharge: 2013-09-26 | Disposition: A | Discharge status: Home / Self Care | Payer: Medicare Other | Attending: Emergency Medicine | Admitting: Emergency Medicine

## 2013-09-26 ENCOUNTER — Encounter (HOSPITAL_COMMUNITY): Payer: Self-pay | Admitting: Emergency Medicine

## 2013-09-26 DIAGNOSIS — Z794 Long term (current) use of insulin: Secondary | ICD-10-CM | POA: Insufficient documentation

## 2013-09-26 DIAGNOSIS — K3184 Gastroparesis: Secondary | ICD-10-CM | POA: Insufficient documentation

## 2013-09-26 DIAGNOSIS — Z8701 Personal history of pneumonia (recurrent): Secondary | ICD-10-CM | POA: Insufficient documentation

## 2013-09-26 DIAGNOSIS — I252 Old myocardial infarction: Secondary | ICD-10-CM | POA: Insufficient documentation

## 2013-09-26 DIAGNOSIS — F17200 Nicotine dependence, unspecified, uncomplicated: Secondary | ICD-10-CM | POA: Insufficient documentation

## 2013-09-26 DIAGNOSIS — Z90710 Acquired absence of both cervix and uterus: Secondary | ICD-10-CM | POA: Diagnosis not present

## 2013-09-26 DIAGNOSIS — I10 Essential (primary) hypertension: Secondary | ICD-10-CM | POA: Diagnosis not present

## 2013-09-26 DIAGNOSIS — Z79899 Other long term (current) drug therapy: Secondary | ICD-10-CM | POA: Insufficient documentation

## 2013-09-26 DIAGNOSIS — Z9889 Other specified postprocedural states: Secondary | ICD-10-CM | POA: Insufficient documentation

## 2013-09-26 DIAGNOSIS — Z9089 Acquired absence of other organs: Secondary | ICD-10-CM | POA: Diagnosis not present

## 2013-09-26 DIAGNOSIS — Z862 Personal history of diseases of the blood and blood-forming organs and certain disorders involving the immune mechanism: Secondary | ICD-10-CM | POA: Diagnosis not present

## 2013-09-26 DIAGNOSIS — R011 Cardiac murmur, unspecified: Secondary | ICD-10-CM | POA: Insufficient documentation

## 2013-09-26 DIAGNOSIS — R112 Nausea with vomiting, unspecified: Secondary | ICD-10-CM | POA: Diagnosis not present

## 2013-09-26 DIAGNOSIS — E1149 Type 2 diabetes mellitus with other diabetic neurological complication: Secondary | ICD-10-CM | POA: Diagnosis not present

## 2013-09-26 DIAGNOSIS — F411 Generalized anxiety disorder: Secondary | ICD-10-CM | POA: Insufficient documentation

## 2013-09-26 DIAGNOSIS — Z7982 Long term (current) use of aspirin: Secondary | ICD-10-CM | POA: Diagnosis not present

## 2013-09-26 LAB — COMPREHENSIVE METABOLIC PANEL
ALT: 11 U/L (ref 0–35)
BUN: 29 mg/dL — ABNORMAL HIGH (ref 6–23)
CO2: 24 mEq/L (ref 19–32)
Calcium: 9.7 mg/dL (ref 8.4–10.5)
GFR calc Af Amer: 33 mL/min — ABNORMAL LOW (ref >90)
GFR calc non Af Amer: 29 mL/min — ABNORMAL LOW (ref >90)
Glucose, Bld: 51 mg/dL — ABNORMAL LOW (ref 70–99)
Sodium: 140 mEq/L (ref 135–145)

## 2013-09-26 LAB — CBC
HCT: 31.6 % — ABNORMAL LOW (ref 36.0–46.0)
Hemoglobin: 11.1 g/dL — ABNORMAL LOW (ref 12.0–15.0)
MCH: 31.8 pg (ref 26.0–34.0)
MCHC: 35.1 g/dL (ref 30.0–36.0)
MCV: 90.5 fL (ref 78.0–100.0)
RBC: 3.49 MIL/uL — ABNORMAL LOW (ref 3.87–5.11)

## 2013-09-26 LAB — GLUCOSE, CAPILLARY
Glucose-Capillary: 64 mg/dL — ABNORMAL LOW (ref 70–99)
Glucose-Capillary: 96 mg/dL (ref 70–99)

## 2013-09-26 MED ORDER — SODIUM CHLORIDE 0.9 % IV BOLUS (SEPSIS)
1000.0000 mL | Freq: Once | INTRAVENOUS | Status: AC
  Administered 2013-09-26: 1000 mL via INTRAVENOUS

## 2013-09-26 MED ORDER — LORAZEPAM 1 MG PO TABS: 1.0000 mg | ORAL_TABLET | Freq: Three times a day (TID) | ORAL | Status: DC | PRN

## 2013-09-26 MED ORDER — METOCLOPRAMIDE HCL 5 MG/ML IJ SOLN
10.0000 mg | Freq: Once | INTRAMUSCULAR | Status: AC
  Administered 2013-09-26: 10 mg via INTRAVENOUS
  Filled 2013-09-26: qty 2

## 2013-09-26 MED ORDER — HEPARIN SOD (PORK) LOCK FLUSH 100 UNIT/ML IV SOLN
500.0000 [IU] | INTRAVENOUS | Status: AC | PRN
  Administered 2013-09-26: 500 [IU]

## 2013-09-26 MED ORDER — ONDANSETRON HCL 4 MG/2ML IJ SOLN
4.0000 mg | Freq: Once | INTRAMUSCULAR | Status: AC
  Administered 2013-09-26: 4 mg via INTRAVENOUS
  Filled 2013-09-26: qty 2

## 2013-09-26 MED ORDER — LORAZEPAM 2 MG/ML IJ SOLN
1.0000 mg | Freq: Once | INTRAMUSCULAR | Status: AC
  Administered 2013-09-26: 1 mg via INTRAVENOUS
  Filled 2013-09-26: qty 1

## 2013-09-26 MED ORDER — HYDROMORPHONE HCL PF 1 MG/ML IJ SOLN
1.0000 mg | Freq: Once | INTRAMUSCULAR | Status: AC
  Administered 2013-09-26: 1 mg via INTRAVENOUS
  Filled 2013-09-26: qty 1

## 2013-09-26 MED ORDER — METOCLOPRAMIDE HCL 10 MG PO TABS: 10.0000 mg | ORAL_TABLET | Freq: Four times a day (QID) | ORAL | Status: DC

## 2013-09-26 MED ORDER — PANTOPRAZOLE SODIUM 40 MG IV SOLR
40.0000 mg | Freq: Once | INTRAVENOUS | Status: AC
  Administered 2013-09-26: 40 mg via INTRAVENOUS
  Filled 2013-09-26: qty 40

## 2013-09-26 NOTE — ED Notes (Signed)
Checked patient blood sugar it was 96 notified RN Janele of blood sugar

## 2013-09-26 NOTE — ED Notes (Addendum)
Vomiting started  At 6 am has taken   Insulin this am states has cyclic vomiting did take a phenergan supp this am at  8 am

## 2013-09-26 NOTE — ED Provider Notes (Addendum)
CSN: [id number]     Arrival date & time 09/26/13  1100 History   First MD Initiated Contact with Patient 09/26/13 1146     Chief Complaint  Patient presents with  . Emesis   (Consider location/radiation/quality/duration/timing/severity/associated sxs/prior Treatment) Patient is a 47 y.o. female presenting with vomiting. The history is provided by the patient.  Emesis Associated symptoms: no abdominal pain, no chills, no diarrhea and no headaches   pt with hx iddm, gastroparesis, cyclic vomiting syndrome c/o nv for past day. Several episodes. Emesis not bloody or bilious. Intermittent mid to upper abd pain, no constant/focal pain. Denies hx pud. No back or flank pain. No fever or chills. Having normal bms. No abd distension. Pt notes similar symptoms in past due to her gastroparesis and cyclic vomiting syndrome.  States normally takes phenergan and ativan, but has been out of her ativan.       Past Medical History  Diagnosis Date  . PANCREATITIS 05/09/2008  . Cyclic vomiting syndrome   . Hypertension   . Complication of anesthesia     "problems waking up"  . Heart murmur   . Myocardial infarction 07/2007    "they say I've had a silent one; I don't know"  . Pneumonia   . Anemia   . Blood transfusion   . GERD (gastroesophageal reflux disease)   . Anxiety   . Depression   . Smoker   . Marijuana smoker, continuous   . NEUROPATHY, DIABETIC 02/23/2007  . Type II diabetes mellitus   . Diabetic gastroparesis     /e-chart   Past Surgical History  Procedure Laterality Date  . Port-a-cath placement  ~ 2008    right chest; "poor access; frequent hospitalizations"  . Laparotomy and lysis of adhesions    . Abdominal hysterectomy  02/2002  . Cholecystectomy  1980's  . Cardiac catheterization    . Dilation and evacuation  X3  . Left hand surgery    . Cataract extraction w/ intraocular lens  implant, bilateral    . Colonoscopy    . Upper gi endoscopy     Family History  Problem  Relation Age of Onset  . Diabetes type II Mother   . Diabetes Mother   . Hypertension Mother   . Diabetes type II Sister   . Diabetes Sister   . Hypertension Sister   . Hypertension Brother    History  Substance Use Topics  . Smoking status: Current Some Day Smoker -- 0.50 packs/day for 22 years    Types: Cigarettes    Last Attempt to Quit: 02/22/2012  . Smokeless tobacco: Never Used  . Alcohol Use: No   OB History   Grav Para Term Preterm Abortions TAB SAB Ect Mult Living                 Review of Systems  Constitutional: Negative for fever and chills.  HENT: Negative for neck pain.   Eyes: Negative for redness.  Respiratory: Negative for shortness of breath.   Cardiovascular: Negative for chest pain.  Gastrointestinal: Positive for nausea and vomiting. Negative for abdominal pain, diarrhea and constipation.  Genitourinary: Negative for dysuria and flank pain.  Musculoskeletal: Negative for back pain.  Skin: Negative for rash.  Neurological: Negative for headaches.  Hematological: Does not bruise/bleed easily.  Psychiatric/Behavioral: Negative for confusion.    Allergies  Bee venom; Acetaminophen; Novolog; and Erythromycin  Home Medications   Current Outpatient Rx  Name  Route  Sig  Dispense  Refill  .  aspirin EC 81 MG tablet   Oral   Take 81 mg by mouth every morning.         . carvedilol (COREG) 25 MG tablet   Oral   Take 25 mg by mouth 2 (two) times daily with a meal.         . diclofenac sodium (VOLTAREN) 1 % GEL   Topical   Apply 2 g topically 4 (four) times daily as needed (for pain).          . insulin glargine (LANTUS) 100 UNIT/ML injection   Subcutaneous   Inject 0.2 mLs (20 Units total) into the skin every morning.   10 mL   3   . lisinopril (PRINIVIL,ZESTRIL) 40 MG tablet   Oral   Take 1 tablet (40 mg total) by mouth every morning.   90 tablet   3   . LORazepam (ATIVAN) 1 MG tablet   Oral   Take 1 tablet (1 mg total) by mouth  every 6 (six) hours as needed for anxiety.   30 tablet   0   . LORazepam (ATIVAN) 1 MG tablet   Oral   Take 1 tablet (1 mg total) by mouth 3 (three) times daily as needed for anxiety.   15 tablet   0   . oxyCODONE (ROXICODONE) 5 MG immediate release tablet   Oral   Take 1 tablet (5 mg total) by mouth every 8 (eight) hours as needed for pain.   15 tablet   0   . promethazine (PHENERGAN) 12.5 MG tablet   Oral   Take 2 tablets (25 mg total) by mouth every 4 (four) hours as needed for nausea.   30 tablet   0   . promethazine (PHENERGAN) 25 MG suppository   Rectal   Place 1 suppository (25 mg total) rectally every 6 (six) hours as needed for nausea.   12 each   0   . SUMAtriptan (IMITREX) 50 MG tablet   Oral   Take 50 mg by mouth every 2 (two) hours as needed for migraine.           BP 151/77  Pulse 67  Temp(Src) 98.4 F (36.9 C)  Resp 16  SpO2 99% Physical Exam  Nursing note and vitals reviewed. Constitutional: She appears well-developed and well-nourished. No distress.  HENT:  Head: Atraumatic.  Mouth/Throat: Oropharynx is clear and moist.  Eyes: Conjunctivae are normal. No scleral icterus.  Neck: Neck supple. No tracheal deviation present.  Cardiovascular: Normal rate, regular rhythm, normal heart sounds and intact distal pulses.   Pulmonary/Chest: Effort normal and breath sounds normal. No respiratory distress.  Abdominal: Soft. Normal appearance and bowel sounds are normal. She exhibits no distension and no mass. There is no tenderness. There is no rebound and no guarding.  Genitourinary:  No cva tenderness  Musculoskeletal: She exhibits no edema.  Neurological: She is alert.  Skin: Skin is warm and dry. No rash noted.  Psychiatric: She has a normal mood and affect.    ED Course  Procedures (including critical care time)  Results for orders placed during the hospital encounter of 09/26/13  CBC      Result Value Range   WBC 7.2  4.0 - 10.5 K/uL   RBC  3.49 (*) 3.87 - 5.11 MIL/uL   Hemoglobin 11.1 (*) 12.0 - 15.0 g/dL   HCT 31.6 (*) 36.0 - 46.0 %   MCV 90.5  78.0 - 100.0 fL   MCH 31.8  26.0 - 34.0 pg   MCHC 35.1  30.0 - 36.0 g/dL   RDW 13.7  11.5 - 15.5 %   Platelets 224  150 - 400 K/uL  COMPREHENSIVE METABOLIC PANEL      Result Value Range   Sodium 140  135 - 145 mEq/L   Potassium 4.1  3.5 - 5.1 mEq/L   Chloride 107  96 - 112 mEq/L   CO2 24  19 - 32 mEq/L   Glucose, Bld 51 (*) 70 - 99 mg/dL   BUN 29 (*) 6 - 23 mg/dL   Creatinine, Ser 1.97 (*) 0.50 - 1.10 mg/dL   Calcium 9.7  8.4 - 10.5 mg/dL   Total Protein 7.4  6.0 - 8.3 g/dL   Albumin 4.0  3.5 - 5.2 g/dL   AST 19  0 - 37 U/L   ALT 11  0 - 35 U/L   Alkaline Phosphatase 74  39 - 117 U/L   Total Bilirubin 0.3  0.3 - 1.2 mg/dL   GFR calc non Af Amer 29 (*) >90 mL/min   GFR calc Af Amer 33 (*) >90 mL/min      MDM  Iv ns bolus. Pt requests nausea med and ativan.  Ativan 1 mg iv, zofran iv.  ?hx diabetic gastroparesis, reglan iv.  Reviewed nursing notes and prior charts for additional history.   Additional iv ns.  Pt states feels improved, but requests pain med.  Recheck abd soft nt.  Dilaudid 1 mg iv. Ivf.    Hct, cr, c/w prior baseline.  abd soft nt.  Tolerating po.   Initial bs in lab was low.  Pt remains conscious and alert.   Given po fluids, meal.  No recurrent nv. Remains fully awake and alert. Had taken her insulin this am but hadnt eaten earlier.  Diabetic meal tray.  Pt improved. Stable for d/c.       Mirna Mires, MD 09/26/13 1501

## 2013-09-26 NOTE — ED Notes (Signed)
Checked patient cbg it was 15 notified RN [name] of blood sugar

## 2013-09-27 ENCOUNTER — Ambulatory Visit (INDEPENDENT_AMBULATORY_CARE_PROVIDER_SITE_OTHER): Payer: Medicare Other | Admitting: Family Medicine

## 2013-09-27 ENCOUNTER — Encounter: Payer: Self-pay | Admitting: Family Medicine

## 2013-09-27 VITALS — BP 165/65 | HR 75 | Temp 98.9°F | Ht 61.5 in | Wt 156.0 lb

## 2013-09-27 DIAGNOSIS — E1329 Other specified diabetes mellitus with other diabetic kidney complication: Secondary | ICD-10-CM

## 2013-09-27 DIAGNOSIS — E1365 Other specified diabetes mellitus with hyperglycemia: Secondary | ICD-10-CM

## 2013-09-27 DIAGNOSIS — Z23 Encounter for immunization: Secondary | ICD-10-CM

## 2013-09-27 DIAGNOSIS — IMO0002 Reserved for concepts with insufficient information to code with codable children: Secondary | ICD-10-CM

## 2013-09-27 DIAGNOSIS — N183 Chronic kidney disease, stage 3 unspecified: Secondary | ICD-10-CM

## 2013-09-27 DIAGNOSIS — E1322 Other specified diabetes mellitus with diabetic chronic kidney disease: Secondary | ICD-10-CM

## 2013-09-27 MED ORDER — PROMETHAZINE HCL 25 MG RE SUPP: 25.0000 mg | Freq: Four times a day (QID) | RECTAL | Status: DC | PRN

## 2013-09-27 MED ORDER — LORAZEPAM 1 MG PO TABS: 1.0000 mg | ORAL_TABLET | Freq: Three times a day (TID) | ORAL | Status: DC | PRN

## 2013-09-27 NOTE — Progress Notes (Signed)
Subjective:     Patient ID: [name] [name], female   DOB: 02/02/1966, 47 y.o.   MRN: [id number]  HPI 47 y.o. F here for diabetes follow up. Also concerned that she ran out of her benzos.  Diabetes: Lantus regimen 20U qam reporting fastings are 95-113. No daily sugars. Not taking novolog Taking lisinopril 40mg  qday (not taken today) Coreg 25mg  BID (not taken today)  Headache: headache started this AM. Pt reports that he is supposed to take ativan at the first sign of nausea to take ativan. If not working states she takes phenergan. Not taking imitrex because out of ativan.   Cyclic vomiting: pt reports she through up twice in last two weeks. Pt reports that MJ is not making her nauseated as it has in the past. Pt reports taking ativan for nausea prior to phenergan.  Review of Systems + HA, no chest pain, SOB, +N/V, no d/c, no f/c    Objective:   Physical Exam BP 165/65  Pulse 75  Temp(Src) 98.9 F (37.2 C) (Oral)  Ht 5' 1.5" (1.562 m)  Wt 70.761 kg (156 lb)  BMI 29 kg/m2  Elevated BP, NAD RRR no mgt CTAB no wrc No cce, no Ulcers on feet, Oncymycosis on great toes     a1c 9.9-->7.7 Assessment:     47 y.o. F here for diabetes follow up.     Plan:     F/u DM - Well controlled with no evidence of nephropathy, neuropathy, or retinopathy.   Pt has been uncontrolled but reporting sugars at goal. Pt is not taking regimen recommended at last visit but reports fasting sugars at goal - Taking ACE-I,    - Checking FS but did not bring log to appt. Reports at goal - HgA1C improved significantly to 7.7 - LDL 80. Close to goal and will not add medication at this time.  - Microalb/Cr Pending but GFR at 41 - Ordered the following screening tests: Microalb:Cr,  - Patient aware that DM is CAD equiv - must optimize HLP/HTN.  - Discussed importance of healthy diet and exercise (5x/week, 20-30 minutes) - Follow up in 3 months or sooner if needed, call ahead for lab entry - Patient agrees  with this plan - continue pt current lantus regimen 20U qam and follow up in 71months. - Microalb/Cr >30 with GFR<30 (flucutating 8-~35) or quickly decreasing GFR: Pt on ACE/ARB; attempting to control BP. Nephro consult placed.    #cyclic vomiting: pt currently tolerating PO, not being exacerbated by MJ use at this time. Pt uses ativan for sx. Recommended use phenergan first then trial ativan. Can try weening at future appointments if indicated. Focused on DM at this visit.   #Headache: recommended pt use her headache regimen or trial tylenol. Pt states understanding. Chronic issue and not CC today.  Fredrik Rigger, MD OB Fellow

## 2013-09-27 NOTE — Patient Instructions (Signed)
Cont lantus 20U qam and follow up in 64months.

## 2013-09-28 LAB — MICROALBUMIN / CREATININE URINE RATIO
Creatinine, Urine: 66.1 mg/dL
Microalb Creat Ratio: 211.2 mg/g — ABNORMAL HIGH (ref 0.0–30.0)

## 2013-10-12 DIAGNOSIS — Z794 Long term (current) use of insulin: Secondary | ICD-10-CM | POA: Diagnosis not present

## 2013-10-12 DIAGNOSIS — E109 Type 1 diabetes mellitus without complications: Secondary | ICD-10-CM | POA: Diagnosis not present

## 2013-10-12 DIAGNOSIS — Z961 Presence of intraocular lens: Secondary | ICD-10-CM | POA: Diagnosis not present

## 2013-10-13 ENCOUNTER — Emergency Department (HOSPITAL_COMMUNITY)
Admission: EM | Admit: 2013-10-13 | Discharge: 2013-10-13 | Disposition: A | Discharge status: Home / Self Care | Payer: Medicare Other | Source: Home / Self Care | Attending: Emergency Medicine | Admitting: Emergency Medicine

## 2013-10-13 ENCOUNTER — Encounter (HOSPITAL_COMMUNITY): Payer: Self-pay | Admitting: Emergency Medicine

## 2013-10-13 DIAGNOSIS — Z862 Personal history of diseases of the blood and blood-forming organs and certain disorders involving the immune mechanism: Secondary | ICD-10-CM | POA: Insufficient documentation

## 2013-10-13 DIAGNOSIS — R109 Unspecified abdominal pain: Secondary | ICD-10-CM | POA: Insufficient documentation

## 2013-10-13 DIAGNOSIS — Z79899 Other long term (current) drug therapy: Secondary | ICD-10-CM | POA: Insufficient documentation

## 2013-10-13 DIAGNOSIS — K859 Acute pancreatitis without necrosis or infection, unspecified: Secondary | ICD-10-CM | POA: Diagnosis not present

## 2013-10-13 DIAGNOSIS — Z794 Long term (current) use of insulin: Secondary | ICD-10-CM | POA: Insufficient documentation

## 2013-10-13 DIAGNOSIS — E1149 Type 2 diabetes mellitus with other diabetic neurological complication: Secondary | ICD-10-CM | POA: Insufficient documentation

## 2013-10-13 DIAGNOSIS — Z8659 Personal history of other mental and behavioral disorders: Secondary | ICD-10-CM | POA: Insufficient documentation

## 2013-10-13 DIAGNOSIS — R011 Cardiac murmur, unspecified: Secondary | ICD-10-CM | POA: Insufficient documentation

## 2013-10-13 DIAGNOSIS — Z8719 Personal history of other diseases of the digestive system: Secondary | ICD-10-CM | POA: Insufficient documentation

## 2013-10-13 DIAGNOSIS — I252 Old myocardial infarction: Secondary | ICD-10-CM | POA: Insufficient documentation

## 2013-10-13 DIAGNOSIS — R1013 Epigastric pain: Secondary | ICD-10-CM | POA: Diagnosis not present

## 2013-10-13 DIAGNOSIS — I10 Essential (primary) hypertension: Secondary | ICD-10-CM | POA: Insufficient documentation

## 2013-10-13 DIAGNOSIS — R1115 Cyclical vomiting syndrome unrelated to migraine: Secondary | ICD-10-CM | POA: Diagnosis not present

## 2013-10-13 DIAGNOSIS — E1142 Type 2 diabetes mellitus with diabetic polyneuropathy: Secondary | ICD-10-CM | POA: Insufficient documentation

## 2013-10-13 DIAGNOSIS — Z7982 Long term (current) use of aspirin: Secondary | ICD-10-CM | POA: Insufficient documentation

## 2013-10-13 DIAGNOSIS — K3184 Gastroparesis: Secondary | ICD-10-CM | POA: Insufficient documentation

## 2013-10-13 DIAGNOSIS — Z8701 Personal history of pneumonia (recurrent): Secondary | ICD-10-CM | POA: Insufficient documentation

## 2013-10-13 DIAGNOSIS — F17200 Nicotine dependence, unspecified, uncomplicated: Secondary | ICD-10-CM | POA: Insufficient documentation

## 2013-10-13 LAB — COMPREHENSIVE METABOLIC PANEL
Albumin: 4.7 g/dL (ref 3.5–5.2)
Alkaline Phosphatase: 81 U/L (ref 39–117)
BUN: 32 mg/dL — ABNORMAL HIGH (ref 6–23)
Calcium: 10.4 mg/dL (ref 8.4–10.5)
Creatinine, Ser: 1.9 mg/dL — ABNORMAL HIGH (ref 0.50–1.10)
GFR calc Af Amer: 35 mL/min — ABNORMAL LOW (ref >90)
Glucose, Bld: 105 mg/dL — ABNORMAL HIGH (ref 70–99)
Potassium: 4.1 mEq/L (ref 3.5–5.1)
Total Protein: 8.3 g/dL (ref 6.0–8.3)

## 2013-10-13 LAB — URINALYSIS, ROUTINE W REFLEX MICROSCOPIC
Bilirubin Urine: NEGATIVE
Leukocytes, UA: NEGATIVE
Nitrite: NEGATIVE
Specific Gravity, Urine: 1.015 (ref 1.005–1.030)
Urobilinogen, UA: 0.2 mg/dL (ref 0.0–1.0)
pH: 5.5 (ref 5.0–8.0)

## 2013-10-13 LAB — CBC WITH DIFFERENTIAL/PLATELET
Basophils Relative: 0 % (ref 0–1)
Eosinophils Absolute: 0.1 10*3/uL (ref 0.0–0.7)
Eosinophils Relative: 2 % (ref 0–5)
Hemoglobin: 12.3 g/dL (ref 12.0–15.0)
Lymphs Abs: 4.1 10*3/uL — ABNORMAL HIGH (ref 0.7–4.0)
MCH: 32.1 pg (ref 26.0–34.0)
MCHC: 36 g/dL (ref 30.0–36.0)
MCV: 89.3 fL (ref 78.0–100.0)
Monocytes Absolute: 0.5 10*3/uL (ref 0.1–1.0)
Monocytes Relative: 7 % (ref 3–12)
Neutrophils Relative %: 35 % — ABNORMAL LOW (ref 43–77)
RBC: 3.83 MIL/uL — ABNORMAL LOW (ref 3.87–5.11)

## 2013-10-13 LAB — LIPASE, BLOOD: Lipase: 95 U/L — ABNORMAL HIGH (ref 11–59)

## 2013-10-13 LAB — URINE MICROSCOPIC-ADD ON

## 2013-10-13 MED ORDER — ONDANSETRON HCL 4 MG/2ML IJ SOLN
4.0000 mg | Freq: Once | INTRAMUSCULAR | Status: AC
  Administered 2013-10-13: 4 mg via INTRAVENOUS
  Filled 2013-10-13: qty 2

## 2013-10-13 MED ORDER — SODIUM CHLORIDE 0.9 % IV SOLN
1000.0000 mL | Freq: Once | INTRAVENOUS | Status: AC
  Administered 2013-10-13: 1000 mL via INTRAVENOUS

## 2013-10-13 MED ORDER — SODIUM CHLORIDE 0.9 % IV SOLN
1000.0000 mL | INTRAVENOUS | Status: DC
  Administered 2013-10-13: 1000 mL via INTRAVENOUS

## 2013-10-13 MED ORDER — METOCLOPRAMIDE HCL 5 MG/ML IJ SOLN: 10.0000 mg | Freq: Once | INTRAMUSCULAR | Status: DC

## 2013-10-13 MED ORDER — HYDROMORPHONE HCL PF 2 MG/ML IJ SOLN
2.0000 mg | Freq: Once | INTRAMUSCULAR | Status: AC
  Administered 2013-10-13: 2 mg via INTRAMUSCULAR
  Filled 2013-10-13: qty 1

## 2013-10-13 MED ORDER — HYDROMORPHONE HCL PF 1 MG/ML IJ SOLN: 1.0000 mg | Freq: Once | INTRAMUSCULAR | Status: DC

## 2013-10-13 MED ORDER — LORAZEPAM 2 MG/ML IJ SOLN
2.0000 mg | Freq: Once | INTRAMUSCULAR | Status: AC
  Administered 2013-10-13: 2 mg via INTRAMUSCULAR
  Filled 2013-10-13: qty 1

## 2013-10-13 MED ORDER — HYDROMORPHONE HCL PF 1 MG/ML IJ SOLN
1.0000 mg | Freq: Once | INTRAMUSCULAR | Status: AC
  Administered 2013-10-13: 1 mg via INTRAVENOUS
  Filled 2013-10-13: qty 1

## 2013-10-13 NOTE — ED Provider Notes (Signed)
CSN: [id number]     Arrival date & time 10/13/13  1330 History   First MD Initiated Contact with Patient 10/13/13 1350     Chief Complaint  Patient presents with  . Emesis  . Abdominal Pain   (Consider location/radiation/quality/duration/timing/severity/associated sxs/prior Treatment) HPI  patient presents tto the ED with cc/ abdominal pain., nause and vomititng. The patient has a pmh of [facility], pancreatitis, she is a frequent visitor to the ED.  Patient c/o sudden onset abdominal pain, nausea, vomiting that began this morning.  Patient states she has been unable to the whole down any of her medications.  She states that her home Phenergan suppositories have not helped with her nausea.  Patient denies daily marijuana use.  Patient denies any urinary symptoms.  She states that her symptoms are the same as her usual cyclic nausea vomiting symptoms.  Past Medical History  Diagnosis Date  . PANCREATITIS 05/09/2008  . Cyclic vomiting syndrome   . Hypertension   . Complication of anesthesia     "problems waking up"  . Heart murmur   . Myocardial infarction 07/2007    "they say I've had a silent one; I don't know"  . Pneumonia   . Anemia   . Blood transfusion   . GERD (gastroesophageal reflux disease)   . Anxiety   . Depression   . Smoker   . Marijuana smoker, continuous   . NEUROPATHY, DIABETIC 02/23/2007  . Type II diabetes mellitus   . Diabetic gastroparesis     /e-chart   Past Surgical History  Procedure Laterality Date  . Port-a-cath placement  ~ 2008    right chest; "poor access; frequent hospitalizations"  . Laparotomy and lysis of adhesions    . Abdominal hysterectomy  02/2002  . Cholecystectomy  1980's  . Cardiac catheterization    . Dilation and evacuation  X3  . Left hand surgery    . Cataract extraction w/ intraocular lens  implant, bilateral    . Colonoscopy    . Upper gi endoscopy     Family History  Problem Relation Age of Onset  . Diabetes type II Mother   .  Diabetes Mother   . Hypertension Mother   . Diabetes type II Sister   . Diabetes Sister   . Hypertension Sister   . Hypertension Brother    History  Substance Use Topics  . Smoking status: Current Some Day Smoker -- 0.50 packs/day for 22 years    Types: Cigarettes    Last Attempt to Quit: 02/22/2012  . Smokeless tobacco: Never Used  . Alcohol Use: No   OB History   Grav Para Term Preterm Abortions TAB SAB Ect Mult Living                 Review of Systems Ten systems reviewed and are negative for acute change, except as noted in the HPI.   Allergies  Bee venom; Acetaminophen; Novolog; and Erythromycin  Home Medications   Current Outpatient Rx  Name  Route  Sig  Dispense  Refill  . aspirin EC 81 MG tablet   Oral   Take 81 mg by mouth every morning.         . carvedilol (COREG) 25 MG tablet   Oral   Take 25 mg by mouth 2 (two) times daily with a meal.         . diclofenac sodium (VOLTAREN) 1 % GEL   Topical   Apply 2 g topically 4 (four)  times daily as needed (for pain).          . insulin glargine (LANTUS) 100 UNIT/ML injection   Subcutaneous   Inject 0.2 mLs (20 Units total) into the skin every morning.   10 mL   3   . lisinopril (PRINIVIL,ZESTRIL) 40 MG tablet   Oral   Take 1 tablet (40 mg total) by mouth every morning.   90 tablet   3   . LORazepam (ATIVAN) 1 MG tablet   Oral   Take 1 tablet (1 mg total) by mouth 3 (three) times daily as needed for anxiety.   20 tablet   0   . metoCLOPramide (REGLAN) 10 MG tablet   Oral   Take 1 tablet (10 mg total) by mouth every 6 (six) hours.   30 tablet   0   . oxyCODONE (ROXICODONE) 5 MG immediate release tablet   Oral   Take 1 tablet (5 mg total) by mouth every 8 (eight) hours as needed for pain.   15 tablet   0   . promethazine (PHENERGAN) 25 MG suppository   Rectal   Place 1 suppository (25 mg total) rectally every 6 (six) hours as needed for nausea.   12 each   0   . promethazine  (PHENERGAN) 25 MG suppository   Rectal   Place 1 suppository (25 mg total) rectally every 6 (six) hours as needed for nausea.   12 each   0   . SUMAtriptan (IMITREX) 50 MG tablet   Oral   Take 50 mg by mouth every 2 (two) hours as needed for migraine. May repeat in 2 hours if headache persists or recurs.          BP 175/93  Pulse 94  Temp(Src) 98.7 F (37.1 C) (Oral)  Resp 24  Ht 5\' 1"  (1.549 m)  Wt 156 lb (70.761 kg)  BMI 29.49 kg/m2  SpO2 100% Physical Exam Physical Exam  Nursing note and vitals reviewed. Constitutional: She is oriented to person, place, and time. She appears well-developed and well-nourished. Very undomfortable. Unable to sit still and walking around there room HENT:  Head: Normocephalic and atraumatic.  Eyes: Conjunctivae normal and EOM are normal. Pupils are equal, round, and reactive to light. No scleral icterus.  Neck: Normal range of motion.  Cardiovascular: Normal rate, regular rhythm and normal heart sounds.  Exam reveals no gallop and no friction rub.   No murmur heard. Pulmonary/Chest: Effort normal and breath sounds normal. No respiratory distress.  Abdominal: Patient TTP epigastrium. Neurological: She is alert and oriented to person, place, and time.  Skin: Skin is warm and dry. She is not diaphoretic.    ED Course  Procedures (including critical care time) Labs Review Labs Reviewed  CBC WITH DIFFERENTIAL  COMPREHENSIVE METABOLIC PANEL  LIPASE, BLOOD  URINALYSIS, ROUTINE W REFLEX MICROSCOPIC   Imaging Review No results found.  EKG Interpretation   None       MDM  No diagnosis found. Patient is actively vomiting. Receiving IM Pain meds/ ativan   4:31 PM BP 145/65  Pulse 74  Temp(Src) 98.7 F (37.1 C) (Oral)  Resp 16  Ht 5\' 1"  (1.549 m)  Wt 156 lb (70.761 kg)  BMI 29.49 kg/m2  SpO2 100% Patient continue to have nausea, vomiting and abdominal pain. PA Rona Ravens will assume care of the patient. Her labs appear at  baseline.  We will try to manage symptoms and let the patient go home.   Vernie Shanks  Kenton Kingfisher, PA-C 10/13/13 [phone]

## 2013-10-13 NOTE — ED Notes (Addendum)
Pt reports waking up this am with abd pain and n/v. Denies diarrhea. Reports hx of cyclic vomiting syndrome x 11 years.

## 2013-10-13 NOTE — ED Provider Notes (Signed)
Please see the initial physicians in no.  I was available for counseling the completion of this patient's care.  Carmin Muskrat, MD 10/13/13 2208

## 2013-10-13 NOTE — ED Provider Notes (Signed)
Received report from PA at beginning of shift.  Pt w/ hx of cyclic vomiting here with n/v and abd pain.  Plan to symptom control and dispo pending her labs and improvement of sxs.    5:25 PM Pt reports although her pain is not fully resolved, she felt it is more tolerable and request to be discharged.  Will perform PO trial prior to discharge.  Otherwise, pt is stable.    6:01 PM Pt able to tolerates PO, stable for discharge.  Return precautions given.  BP 145/65  Pulse 74  Temp(Src) 98.7 F (37.1 C) (Oral)  Resp 16  Ht 5\' 1"  (1.549 m)  Wt 156 lb (70.761 kg)  BMI 29.49 kg/m2  SpO2 100%  I have reviewed nursing notes and vital signs. I personally reviewed the imaging tests through PACS system  I reviewed available ER/hospitalization records thought the EMR  Results for orders placed during the hospital encounter of 10/13/13  CBC WITH DIFFERENTIAL      Result Value Range   WBC 7.3  4.0 - 10.5 K/uL   RBC 3.83 (*) 3.87 - 5.11 MIL/uL   Hemoglobin 12.3  12.0 - 15.0 g/dL   HCT 34.2 (*) 36.0 - 46.0 %   MCV 89.3  78.0 - 100.0 fL   MCH 32.1  26.0 - 34.0 pg   MCHC 36.0  30.0 - 36.0 g/dL   RDW 13.1  11.5 - 15.5 %   Platelets 234  150 - 400 K/uL   Neutrophils Relative % 35 (*) 43 - 77 %   Neutro Abs 2.5  1.7 - 7.7 K/uL   Lymphocytes Relative 56 (*) 12 - 46 %   Lymphs Abs 4.1 (*) 0.7 - 4.0 K/uL   Monocytes Relative 7  3 - 12 %   Monocytes Absolute 0.5  0.1 - 1.0 K/uL   Eosinophils Relative 2  0 - 5 %   Eosinophils Absolute 0.1  0.0 - 0.7 K/uL   Basophils Relative 0  0 - 1 %   Basophils Absolute 0.0  0.0 - 0.1 K/uL  COMPREHENSIVE METABOLIC PANEL      Result Value Range   Sodium 140  135 - 145 mEq/L   Potassium 4.1  3.5 - 5.1 mEq/L   Chloride 105  96 - 112 mEq/L   CO2 20  19 - 32 mEq/L   Glucose, Bld 105 (*) 70 - 99 mg/dL   BUN 32 (*) 6 - 23 mg/dL   Creatinine, Ser 1.90 (*) 0.50 - 1.10 mg/dL   Calcium 10.4  8.4 - 10.5 mg/dL   Total Protein 8.3  6.0 - 8.3 g/dL   Albumin 4.7  3.5  - 5.2 g/dL   AST 17  0 - 37 U/L   ALT 10  0 - 35 U/L   Alkaline Phosphatase 81  39 - 117 U/L   Total Bilirubin 0.3  0.3 - 1.2 mg/dL   GFR calc non Af Amer 30 (*) >90 mL/min   GFR calc Af Amer 35 (*) >90 mL/min  LIPASE, BLOOD      Result Value Range   Lipase 95 (*) 11 - 59 U/L  URINALYSIS, ROUTINE W REFLEX MICROSCOPIC      Result Value Range   Color, Urine YELLOW  YELLOW   APPearance CLOUDY (*) CLEAR   Specific Gravity, Urine 1.015  1.005 - 1.030   pH 5.5  5.0 - 8.0   Glucose, UA NEGATIVE  NEGATIVE mg/dL   Hgb urine dipstick  TRACE (*) NEGATIVE   Bilirubin Urine NEGATIVE  NEGATIVE   Ketones, ur NEGATIVE  NEGATIVE mg/dL   Protein, ur 100 (*) NEGATIVE mg/dL   Urobilinogen, UA 0.2  0.0 - 1.0 mg/dL   Nitrite NEGATIVE  NEGATIVE   Leukocytes, UA NEGATIVE  NEGATIVE  URINE MICROSCOPIC-ADD ON      Result Value Range   Squamous Epithelial / LPF FEW (*) RARE   RBC / HPF 0-2  <3 RBC/hpf   No results found.    Domenic Moras, PA-C 10/13/13 1801

## 2013-10-14 ENCOUNTER — Encounter (HOSPITAL_COMMUNITY): Payer: Self-pay | Admitting: Emergency Medicine

## 2013-10-14 ENCOUNTER — Inpatient Hospital Stay (HOSPITAL_COMMUNITY): Payer: Medicare Other

## 2013-10-14 ENCOUNTER — Inpatient Hospital Stay (HOSPITAL_COMMUNITY)
Admission: EM | Admit: 2013-10-14 | Discharge: 2013-10-16 | DRG: 439 | Disposition: A | Discharge status: Home / Self Care | Payer: Medicare Other | Attending: Family Medicine | Admitting: Family Medicine

## 2013-10-14 DIAGNOSIS — E1142 Type 2 diabetes mellitus with diabetic polyneuropathy: Secondary | ICD-10-CM | POA: Diagnosis present

## 2013-10-14 DIAGNOSIS — N189 Chronic kidney disease, unspecified: Secondary | ICD-10-CM | POA: Diagnosis not present

## 2013-10-14 DIAGNOSIS — K209 Esophagitis, unspecified without bleeding: Secondary | ICD-10-CM

## 2013-10-14 DIAGNOSIS — R1115 Cyclical vomiting syndrome unrelated to migraine: Secondary | ICD-10-CM

## 2013-10-14 DIAGNOSIS — N183 Chronic kidney disease, stage 3 unspecified: Secondary | ICD-10-CM | POA: Diagnosis present

## 2013-10-14 DIAGNOSIS — F17200 Nicotine dependence, unspecified, uncomplicated: Secondary | ICD-10-CM | POA: Diagnosis present

## 2013-10-14 DIAGNOSIS — K859 Acute pancreatitis without necrosis or infection, unspecified: Secondary | ICD-10-CM

## 2013-10-14 DIAGNOSIS — K861 Other chronic pancreatitis: Secondary | ICD-10-CM | POA: Diagnosis present

## 2013-10-14 DIAGNOSIS — E1149 Type 2 diabetes mellitus with other diabetic neurological complication: Secondary | ICD-10-CM | POA: Diagnosis present

## 2013-10-14 DIAGNOSIS — Z794 Long term (current) use of insulin: Secondary | ICD-10-CM

## 2013-10-14 DIAGNOSIS — F1210 Cannabis abuse, uncomplicated: Secondary | ICD-10-CM | POA: Diagnosis present

## 2013-10-14 DIAGNOSIS — K920 Hematemesis: Secondary | ICD-10-CM | POA: Diagnosis present

## 2013-10-14 DIAGNOSIS — Z7982 Long term (current) use of aspirin: Secondary | ICD-10-CM

## 2013-10-14 DIAGNOSIS — D649 Anemia, unspecified: Secondary | ICD-10-CM | POA: Diagnosis present

## 2013-10-14 DIAGNOSIS — E1129 Type 2 diabetes mellitus with other diabetic kidney complication: Secondary | ICD-10-CM | POA: Diagnosis present

## 2013-10-14 DIAGNOSIS — R1013 Epigastric pain: Secondary | ICD-10-CM | POA: Diagnosis not present

## 2013-10-14 DIAGNOSIS — E1322 Other specified diabetes mellitus with diabetic chronic kidney disease: Secondary | ICD-10-CM | POA: Diagnosis present

## 2013-10-14 DIAGNOSIS — I129 Hypertensive chronic kidney disease with stage 1 through stage 4 chronic kidney disease, or unspecified chronic kidney disease: Secondary | ICD-10-CM | POA: Diagnosis present

## 2013-10-14 DIAGNOSIS — I10 Essential (primary) hypertension: Secondary | ICD-10-CM

## 2013-10-14 LAB — COMPREHENSIVE METABOLIC PANEL
ALT: 18 U/L (ref 0–35)
AST: 23 U/L (ref 0–37)
Albumin: 4.5 g/dL (ref 3.5–5.2)
Alkaline Phosphatase: 83 U/L (ref 39–117)
BUN: 25 mg/dL — ABNORMAL HIGH (ref 6–23)
CO2: 22 mEq/L (ref 19–32)
Calcium: 10.2 mg/dL (ref 8.4–10.5)
Chloride: 105 mEq/L (ref 96–112)
Creatinine, Ser: 1.61 mg/dL — ABNORMAL HIGH (ref 0.50–1.10)
GFR calc Af Amer: 43 mL/min — ABNORMAL LOW (ref >90)
GFR calc non Af Amer: 37 mL/min — ABNORMAL LOW (ref >90)
Glucose, Bld: 204 mg/dL — ABNORMAL HIGH (ref 70–99)
Potassium: 4.1 mEq/L (ref 3.5–5.1)
Sodium: 143 mEq/L (ref 135–145)
Total Bilirubin: 0.4 mg/dL (ref 0.3–1.2)
Total Protein: 8 g/dL (ref 6.0–8.3)

## 2013-10-14 LAB — CBC
HCT: 32.2 % — ABNORMAL LOW (ref 36.0–46.0)
Hemoglobin: 11.7 g/dL — ABNORMAL LOW (ref 12.0–15.0)
MCH: 32.6 pg (ref 26.0–34.0)
MCHC: 36.3 g/dL — ABNORMAL HIGH (ref 30.0–36.0)
MCV: 89.7 fL (ref 78.0–100.0)
Platelets: 215 10*3/uL (ref 150–400)
RBC: 3.59 MIL/uL — ABNORMAL LOW (ref 3.87–5.11)
RDW: 13.1 % (ref 11.5–15.5)
WBC: 5.6 10*3/uL (ref 4.0–10.5)

## 2013-10-14 LAB — GLUCOSE, CAPILLARY
Glucose-Capillary: 106 mg/dL — ABNORMAL HIGH (ref 70–99)
Glucose-Capillary: 72 mg/dL (ref 70–99)

## 2013-10-14 MED ORDER — SODIUM CHLORIDE 0.45 % IV SOLN
INTRAVENOUS | Status: DC
  Administered 2013-10-14: 16:00:00 via INTRAVENOUS

## 2013-10-14 MED ORDER — HYDROMORPHONE HCL PF 1 MG/ML IJ SOLN
1.0000 mg | Freq: Once | INTRAMUSCULAR | Status: AC
  Administered 2013-10-14: 1 mg via INTRAVENOUS
  Filled 2013-10-14: qty 1

## 2013-10-14 MED ORDER — INSULIN ASPART 100 UNIT/ML ~~LOC~~ SOLN
0.0000 [IU] | Freq: Three times a day (TID) | SUBCUTANEOUS | Status: DC
Start: 2013-10-14 — End: 2013-10-16
  Administered 2013-10-15: 13:00:00 3 [IU] via SUBCUTANEOUS
  Administered 2013-10-15: 5 [IU] via SUBCUTANEOUS

## 2013-10-14 MED ORDER — SODIUM CHLORIDE 0.9 % IV SOLN: INTRAVENOUS | Status: DC

## 2013-10-14 MED ORDER — GI COCKTAIL ~~LOC~~
30.0000 mL | Freq: Once | ORAL | Status: AC
  Administered 2013-10-14: 30 mL via ORAL
  Filled 2013-10-14: qty 30

## 2013-10-14 MED ORDER — HYDROMORPHONE HCL PF 1 MG/ML IJ SOLN
1.0000 mg | INTRAMUSCULAR | Status: DC | PRN
  Administered 2013-10-14 – 2013-10-15 (×4): 1 mg via INTRAVENOUS
  Filled 2013-10-14 (×4): qty 1

## 2013-10-14 MED ORDER — LORAZEPAM 2 MG/ML IJ SOLN
1.0000 mg | Freq: Three times a day (TID) | INTRAMUSCULAR | Status: DC | PRN
  Administered 2013-10-15 (×2): 1 mg via INTRAVENOUS
  Filled 2013-10-14 (×2): qty 1

## 2013-10-14 MED ORDER — PANTOPRAZOLE SODIUM 40 MG IV SOLR
40.0000 mg | INTRAVENOUS | Status: DC
  Administered 2013-10-15: 14:00:00 40 mg via INTRAVENOUS
  Filled 2013-10-14 (×3): qty 40

## 2013-10-14 MED ORDER — PROMETHAZINE HCL 25 MG/ML IJ SOLN
25.0000 mg | Freq: Four times a day (QID) | INTRAMUSCULAR | Status: DC | PRN
  Administered 2013-10-14 – 2013-10-15 (×2): 25 mg via INTRAVENOUS
  Filled 2013-10-14 (×3): qty 1

## 2013-10-14 MED ORDER — SODIUM CHLORIDE 0.9 % IV SOLN
INTRAVENOUS | Status: DC
  Administered 2013-10-14: 23:00:00 via INTRAVENOUS

## 2013-10-14 MED ORDER — PROMETHAZINE HCL 25 MG/ML IJ SOLN
12.5000 mg | Freq: Once | INTRAMUSCULAR | Status: AC
  Administered 2013-10-14: 12.5 mg via INTRAVENOUS
  Filled 2013-10-14: qty 1

## 2013-10-14 MED ORDER — DEXTROSE 50 % IV SOLN
1.0000 | Freq: Once | INTRAVENOUS | Status: AC
  Administered 2013-10-14: 23:00:00 50 mL via INTRAVENOUS
  Filled 2013-10-14: qty 50

## 2013-10-14 MED ORDER — PROMETHAZINE HCL 25 MG/ML IJ SOLN
12.5000 mg | Freq: Once | INTRAMUSCULAR | Status: AC
  Administered 2013-10-14: 12.5 mg via INTRAVENOUS

## 2013-10-14 MED ORDER — SODIUM CHLORIDE 0.9 % IV BOLUS (SEPSIS)
1000.0000 mL | Freq: Once | INTRAVENOUS | Status: AC
  Administered 2013-10-14: 1000 mL via INTRAVENOUS

## 2013-10-14 MED ORDER — INSULIN GLARGINE 100 UNIT/ML ~~LOC~~ SOLN
5.0000 [IU] | Freq: Every day | SUBCUTANEOUS | Status: DC
  Filled 2013-10-14 (×2): qty 0.05

## 2013-10-14 MED ORDER — ONDANSETRON HCL 4 MG/2ML IJ SOLN
4.0000 mg | Freq: Once | INTRAMUSCULAR | Status: AC
  Administered 2013-10-14: 4 mg via INTRAVENOUS
  Filled 2013-10-14: qty 2

## 2013-10-14 MED ORDER — METOPROLOL TARTRATE 1 MG/ML IV SOLN
5.0000 mg | Freq: Three times a day (TID) | INTRAVENOUS | Status: DC
  Administered 2013-10-14 – 2013-10-15 (×2): 5 mg via INTRAVENOUS
  Filled 2013-10-14 (×5): qty 5

## 2013-10-14 NOTE — ED Notes (Addendum)
Pt was here yesterday for same complaint.  Pt states she is very weak and has vomited 7x since leaving.  Pt is diaphoretic and vomiting.  Small amount of red noted to vomit.

## 2013-10-14 NOTE — ED Notes (Signed)
IV team paged and call returned. Will access port and draw labs. Pt refused phlebotomy  for labs.

## 2013-10-14 NOTE — ED Notes (Signed)
MD at bedside. 

## 2013-10-14 NOTE — ED Provider Notes (Signed)
CSN: [id number]     Arrival date & time 10/14/13  0759 History   First MD Initiated Contact with Patient 10/14/13 [phone]     Chief Complaint  Patient presents with  . Abdominal Pain   (Consider location/radiation/quality/duration/timing/severity/associated sxs/prior Treatment) HPI Patient presents to the emergency department with continued nausea, vomiting, and abdominal pain.  Patient, states she was seen here yesterday afternoon for similar complaints and discharged home.  Patient, states, since that time she has continued to vomit.  He should states, that she has had no chest pain, shortness of breath, blurred vision, headache, weakness, numbness, dizziness, or syncope.  Patient, states she's not had any fevers, but had some sweating noted.  Patient, states nothing seems to make her condition, better or worse.  Patient, states this feels, like her normal episodes with cyclic vomiting syndrome. Past Medical History  Diagnosis Date  . PANCREATITIS 05/09/2008  . Cyclic vomiting syndrome   . Hypertension   . Complication of anesthesia     "problems waking up"  . Heart murmur   . Myocardial infarction 07/2007    "they say I've had a silent one; I don't know"  . Pneumonia   . Anemia   . Blood transfusion   . GERD (gastroesophageal reflux disease)   . Anxiety   . Depression   . Smoker   . Marijuana smoker, continuous   . NEUROPATHY, DIABETIC 02/23/2007  . Type II diabetes mellitus   . Diabetic gastroparesis     /e-chart   Past Surgical History  Procedure Laterality Date  . Port-a-cath placement  ~ 2008    right chest; "poor access; frequent hospitalizations"  . Laparotomy and lysis of adhesions    . Abdominal hysterectomy  02/2002  . Cholecystectomy  1980's  . Cardiac catheterization    . Dilation and evacuation  X3  . Left hand surgery    . Cataract extraction w/ intraocular lens  implant, bilateral    . Colonoscopy    . Upper gi endoscopy     Family History  Problem Relation  Age of Onset  . Diabetes type II Mother   . Diabetes Mother   . Hypertension Mother   . Diabetes type II Sister   . Diabetes Sister   . Hypertension Sister   . Hypertension Brother    History  Substance Use Topics  . Smoking status: Current Some Day Smoker -- 0.50 packs/day for 22 years    Types: Cigarettes    Last Attempt to Quit: 02/22/2012  . Smokeless tobacco: Never Used  . Alcohol Use: No   OB History   Grav Para Term Preterm Abortions TAB SAB Ect Mult Living                 Review of Systems All other systems negative except as documented in the HPI. All pertinent positives and negatives as reviewed in the HPI. Allergies  Bee venom; Acetaminophen; Novolog; and Erythromycin  Home Medications   Current Outpatient Rx  Name  Route  Sig  Dispense  Refill  . aspirin EC 81 MG tablet   Oral   Take 81 mg by mouth every morning.         . carvedilol (COREG) 25 MG tablet   Oral   Take 25 mg by mouth 2 (two) times daily with a meal.         . diclofenac sodium (VOLTAREN) 1 % GEL   Topical   Apply 2 g topically 4 (four) times  daily as needed (for pain).          . insulin glargine (LANTUS) 100 UNIT/ML injection   Subcutaneous   Inject 20 Units into the skin every morning.         Marland Kitchen lisinopril (PRINIVIL,ZESTRIL) 40 MG tablet   Oral   Take 40 mg by mouth every morning.         Marland Kitchen LORazepam (ATIVAN) 1 MG tablet   Oral   Take 1 mg by mouth every 8 (eight) hours as needed for anxiety.         . metoCLOPramide (REGLAN) 10 MG tablet   Oral   Take 10 mg by mouth 4 (four) times daily.         Marland Kitchen oxyCODONE (OXY IR/ROXICODONE) 5 MG immediate release tablet   Oral   Take 5 mg by mouth every 8 (eight) hours as needed for pain.         . promethazine (PHENERGAN) 25 MG suppository   Rectal   Place 25 mg rectally every 6 (six) hours as needed for nausea.         . SUMAtriptan (IMITREX) 50 MG tablet   Oral   Take 50 mg by mouth every 2 (two) hours as  needed for migraine. May repeat in 2 hours if headache persists or recurs.          BP 185/83  Pulse 91  Temp(Src) 98.3 F (36.8 C)  Resp 19  SpO2 99% Physical Exam  Nursing note and vitals reviewed. Constitutional: She appears well-developed and well-nourished. No distress.  Cardiovascular: Normal rate, regular rhythm and normal heart sounds.  Exam reveals no gallop and no friction rub.   No murmur heard. Pulmonary/Chest: Effort normal and breath sounds normal. No respiratory distress.  Abdominal: Soft. Bowel sounds are normal. She exhibits no distension. There is tenderness in the epigastric area. There is no rigidity and no guarding.      ED Course  Procedures (including critical care time) Labs Review Labs Reviewed  CBC - Abnormal; Notable for the following:    RBC 3.59 (*)    Hemoglobin 11.7 (*)    HCT 32.2 (*)    MCHC 36.3 (*)    All other components within normal limits  COMPREHENSIVE METABOLIC PANEL  LIPASE, BLOOD   patient be admitted to the family practice clinic resident for continued cyclic vomiting.  Patient is advised of the plan and all questions were answered    Brent General, PA-C 10/16/13 1627

## 2013-10-14 NOTE — ED Notes (Signed)
Pt aware of delay and waiting for IV team.

## 2013-10-14 NOTE — Discharge Summary (Signed)
[facility] Discharge Summary  Patient name: [name] [name] Medical record number: [id number] Date of birth: [birth year] Age: 47 y.o. Gender: female Date of Admission: 10/14/2013  Date of Discharge: 10/16/2013  Admitting Physician: Lind Covert, MD  Primary Care Provider: Conni Slipper, MD Consultants: none  Indication for Hospitalization: vomiting, abdominal pain  Discharge Diagnoses/Problem List:  Patient Active Problem List   Diagnosis Date Noted  . Pancreatitis, acute 10/14/2013  . Acute kidney injury 08/17/2013  . Viral URI with cough 07/25/2013  . Strep throat 07/13/2013  . Trichomonas 07/13/2013  . Chest pain 06/25/2013  . Hyperkalemia 06/25/2013  . Pruritus 06/20/2013  . Abdominal pain, epigastric 04/19/2013  . Esophagitis 04/18/2013  . Preventative health care 09/18/2012  . High risk social situation 08/31/2012  . Allergic rhinitis, seasonal 05/04/2012  . Marijuana smoker 11/03/2011  . Uncontrolled secondary diabetes mellitus with stage 3 CKD (GFR 30-59) 10/23/2011  . Cyclic vomiting syndrome 08/03/2011  . Hot flashes 07/29/2011  . BACK PAIN, CHRONIC 04/10/2007  . DEPRESSION, MAJOR, RECURRENT 02/23/2007  . TOBACCO DEPENDENCE 02/23/2007  . MIGRAINE, UNSPEC., W/O INTRACTABLE MIGRAINE 02/23/2007  . Essential hypertension, malignant 02/23/2007  . INSOMNIA NOS 02/23/2007  . NEUROPATHY, DIABETIC 02/23/2007  . GERD 01/27/2004    Disposition: home  Discharge Condition: improved  Discharge Exam:  General: lying in bed, appears in pain, irritable  HEENT: MMM, NCAT  Cardiovascular: RRR, 3/6 holosystolic murmur heard best at RUSB, 2+ dp pulses  Respiratory: CTAB, normal WOB  Abdomen: soft, nondistended, nontender palpation  Extremities: WWP, no LE edema  Neuro: alert and oriented, no focal deficits, poor insight  Brief Hospital Course: [name] [name] is a 47 y.o. female presenting with 2 days of nausea/vomiting and  worsening abdominal pain . PMH is significant for cyclic vomiting, chronic pancreatitis, migraines, hypertension, DM2, marijuana abuse.   # Cyclic vomiting, abdominal pain, AoC pancreatitis, h/o esophagitis on EGD, hematemesis after repeated emesis and hours of dry heaving: Patient was treated for pancreatitis as well as her cyclic vomiting, likely triggered by marijuana use prior to presentation. She was initially NPO and was advanced as tolerated when her pain was able to be controlled with IV meds. Her nausea/vomiting was treated with ativan and phenergan and she was placed on scheduled protonix for some burning epigastric pain, likely related to her vomiting. An abdominal ultrasound showed normal pancreas and s/p cholecystectomy. She had no further bleeding during her stay and was able to tolerate a PO diet and medications prior to discharge.  # DM: The patient received 5u on lantus initially while she was NPO and this was gradually increased with her po intake to her home dose of 20u.   # HTN: Her blood pressure was quite elevated on admission, likely due to intolerance of PO meds and pain. Her BP was controlled with IV lopressor while she was NPO and then her home lisinopril and coreg were restarted when she was able to tolerate them.  Issues for Follow Up:  # Social: Patient continues to insist that marijuana is not responsible for her vomiting. Stress that it is and offer educational materials explaining how this works and resources to help her stop.  # Anemia: Patient's hemoglobin continued to fall slowly during her stay to 9.4 on day of discharge. An FOBT was unable to be collected prior to discharge. Consider repeat CBC at follow-up appointment and FOBT+/- DRE +/- referral for colonoscopy.  Significant Procedures: none  Significant Labs and Imaging:  Recent Labs Lab 10/14/13 0942 10/15/13 0500 10/16/13 1040  WBC 5.6 4.9 5.3  HGB 11.7* 10.0* 9.4*  HCT 32.2* 28.7* 27.3*  PLT 215  172 169    Recent Labs Lab 10/13/13 1510 10/14/13 0942 10/15/13 0500  NA 140 143 141  K 4.1 4.1 3.8  CL 105 105 107  CO2 20 22 24   GLUCOSE 105* 204* 109*  BUN 32* 25* 18  CREATININE 1.90* 1.61* 1.63*  CALCIUM 10.4 10.2 9.0  ALKPHOS 81 83  --   AST 17 23  --   ALT 10 18  --   ALBUMIN 4.7 4.5  --    Lipase 95 (10/18) -> 142 (10/19)  :  Abd u/s: Prior cholecystectomy. Normal pancreas. Heterogeneous echotexture throughout the liver. Questionable nodular contours. Recommend clinical correlation for possibility of cirrhosis. Echogenic kidneys bilaterally compatible with chronic medical renal disease.  Results/Tests Pending at Time of Discharge: none  Discharge Medications:    Medication List         aspirin EC 81 MG tablet  Take 81 mg by mouth every morning.     carvedilol 25 MG tablet  Commonly known as:  COREG  Take 25 mg by mouth 2 (two) times daily with a meal.     diclofenac sodium 1 % Gel  Commonly known as:  VOLTAREN  Apply 2 g topically 4 (four) times daily as needed (for pain).     insulin glargine 100 UNIT/ML injection  Commonly known as:  LANTUS  Inject 20 Units into the skin every morning.     lisinopril 40 MG tablet  Commonly known as:  PRINIVIL,ZESTRIL  Take 40 mg by mouth every morning.     LORazepam 1 MG tablet  Commonly known as:  ATIVAN  Take 1 tablet (1 mg total) by mouth every 8 (eight) hours as needed for anxiety.     metoCLOPramide 10 MG tablet  Commonly known as:  REGLAN  Take 1 tablet (10 mg total) by mouth 4 (four) times daily.     ondansetron 8 MG tablet  Commonly known as:  ZOFRAN  Take 1 tablet (8 mg total) by mouth every 8 (eight) hours as needed.     oxyCODONE 5 MG immediate release tablet  Commonly known as:  Oxy IR/ROXICODONE  Take 1 tablet (5 mg total) by mouth every 8 (eight) hours as needed for pain.     pantoprazole 40 MG tablet  Commonly known as:  PROTONIX  Take 1 tablet (40 mg total) by mouth daily at 12 noon.      polyethylene glycol packet  Commonly known as:  MIRALAX / GLYCOLAX  Take 1 cap twice a day, adjust as needed to produce 1-2 soft bowel movements per day.     promethazine 25 MG tablet  Commonly known as:  PHENERGAN  Take 1 tablet (25 mg total) by mouth every 6 (six) hours.     promethazine 25 MG suppository  Commonly known as:  PHENERGAN  Place 1 suppository (25 mg total) rectally every 6 (six) hours as needed for nausea (Take only when unable to keep oral phenergan down).     SUMAtriptan 50 MG tablet  Commonly known as:  IMITREX  Take 50 mg by mouth every 2 (two) hours as needed for migraine. May repeat in 2 hours if headache persists or recurs.        Discharge Instructions: Please refer to Patient Instructions section of EMR for full details.  Patient was counseled important  signs and symptoms that should prompt return to medical care, changes in medications, dietary instructions, activity restrictions, and follow up appointments.   Follow-Up Appointments: Follow-up Information   Follow up with Conni Slipper, MD. Schedule an appointment as soon as possible for a visit on 10/23/2013. (@3 :15 pm spoke with Kennyth Lose )    Specialty:  Family Medicine   Contact information:   [place] [address] [phone]       Beverlyn Roux, MD 10/16/2013, 1:55 PM PGY-1, [place]

## 2013-10-14 NOTE — H&P (Signed)
[facility] Admission History and Physical Service Pager: [phone]  Patient name: [name] [name] Medical record number: [id number] Date of birth: 12-13-1966 Age: 47 y.o. Gender: female  Primary Care Provider: Conni Slipper, MD Consultants: none Code Status: full  Chief Complaint: vomiting, abdominal pain  Assessment and Plan: [name] [name] is a 47 y.o. female presenting with 2 days of nausea/vomiting and worsening abdominal pain . PMH is significant for cyclic vomiting, chronic pancreatitis, migraines, hypertension, DM2, marijuana abuse.  # Cyclic vomiting, abdominal pain, AoC pancreatitis, h/o esophagitis on EGD, hematemesis after repeated emesis and hours of dry heaving: got dilaudid and phenergan x2 in ED, no improvement so far - ordered zofran and GI cocktail now - start ativan and phenergan prn, scheduled protonix - lipase up at 142 - monitor CBC for hgb drop, monitor for hematochezia/melena - NPO, advance when pain controlled w/o IV meds - abdominal u/s to eval for new GI pathology given severe pain/tenderness  # DM: home regimen 20u lantus qam, glucose 204 this am - lantus 5u while NPO + SSI  # HTN: lisinopril and coreg at home, up to 190s in ED with severe pain, now coming down to 170s - Lopressor while NPO, restart home meds when able  FEN/GI: NPO, MIVF @ 125 Prophylaxis: SCDs (concern for GI bleed), PPI as above  Disposition: admit to floor pending clinical improvement and tolerating po  History of Present Illness: [name] [name] is a 47 y.o. female presenting with 2 days of nausea, vomiting, and abdominal pain. Her pain is described as a burning and is located in epigastric region. She reports her vomiting was severe yesterday so she presented to ED and got somewhat better with phenergan and pain control and was sent home. At home she reports some relief with a hot bath and then waking up with worse pain and some hematemesis today.  Denies fevers, HA, diarrhea, constipation, hematochezia, melena. Endorses chills. Reports this feels like her previous cyclic vomiting episodes.  Review Of Systems: Per HPI  Otherwise 12 point review of systems was performed and was unremarkable.  Patient Active Problem List   Diagnosis Date Noted  . Acute kidney injury 08/17/2013  . Viral URI with cough 07/25/2013  . Strep throat 07/13/2013  . Trichomonas 07/13/2013  . Chest pain 06/25/2013  . Hyperkalemia 06/25/2013  . Pruritus 06/20/2013  . Abdominal pain, epigastric 04/19/2013  . Esophagitis 04/18/2013  . Preventative health care 09/18/2012  . High risk social situation 08/31/2012  . Allergic rhinitis, seasonal 05/04/2012  . Marijuana smoker 11/03/2011  . Uncontrolled secondary diabetes mellitus with stage 3 CKD (GFR 30-59) 10/23/2011  . Cyclic vomiting syndrome 08/03/2011  . Hot flashes 07/29/2011  . BACK PAIN, CHRONIC 04/10/2007  . DEPRESSION, MAJOR, RECURRENT 02/23/2007  . TOBACCO DEPENDENCE 02/23/2007  . MIGRAINE, UNSPEC., W/O INTRACTABLE MIGRAINE 02/23/2007  . Essential hypertension, malignant 02/23/2007  . INSOMNIA NOS 02/23/2007  . NEUROPATHY, DIABETIC 02/23/2007  . GERD 01/27/2004   Past Medical History: Past Medical History  Diagnosis Date  . PANCREATITIS 05/09/2008  . Cyclic vomiting syndrome   . Hypertension   . Complication of anesthesia     "problems waking up"  . Heart murmur   . Myocardial infarction 07/2007    "they say I've had a silent one; I don't know"  . Pneumonia   . Anemia   . Blood transfusion   . GERD (gastroesophageal reflux disease)   . Anxiety   . Depression   . Smoker   .  Marijuana smoker, continuous   . NEUROPATHY, DIABETIC 02/23/2007  . Type II diabetes mellitus   . Diabetic gastroparesis     /e-chart   Past Surgical History: Past Surgical History  Procedure Laterality Date  . Port-a-cath placement  ~ 2008    right chest; "poor access; frequent hospitalizations"  .  Laparotomy and lysis of adhesions    . Abdominal hysterectomy  02/2002  . Cholecystectomy  1980's  . Cardiac catheterization    . Dilation and evacuation  X3  . Left hand surgery    . Cataract extraction w/ intraocular lens  implant, bilateral    . Colonoscopy    . Upper gi endoscopy     Social History: History  Substance Use Topics  . Smoking status: Current Some Day Smoker -- 0.50 packs/day for 22 years    Types: Cigarettes    Last Attempt to Quit: 02/22/2012  . Smokeless tobacco: Never Used  . Alcohol Use: No   Additional social history: Smokes marijuana occasionally, last time was Friday 10/17 Please also refer to relevant sections of EMR.  Family History: Family History  Problem Relation Age of Onset  . Diabetes type II Mother   . Diabetes Mother   . Hypertension Mother   . Diabetes type II Sister   . Diabetes Sister   . Hypertension Sister   . Hypertension Brother    Allergies and Medications: Allergies  Allergen Reactions  . Bee Venom Anaphylaxis  . Acetaminophen Nausea And Vomiting  . Novolog [Insulin Aspart] Other (See Comments)    Muscles in feet cramp  . Erythromycin Nausea And Vomiting   Current Facility-Administered Medications on File Prior to Encounter  Medication Dose Route Frequency Provider Last Rate Last Dose  . 0.9 %  sodium chloride infusion   Intravenous Continuous Andrena Mews, MD 20 mL/hr at 04/19/13 1500 500 mL at 04/19/13 1500   Current Outpatient Prescriptions on File Prior to Encounter  Medication Sig Dispense Refill  . aspirin EC 81 MG tablet Take 81 mg by mouth every morning.      . carvedilol (COREG) 25 MG tablet Take 25 mg by mouth 2 (two) times daily with a meal.      . diclofenac sodium (VOLTAREN) 1 % GEL Apply 2 g topically 4 (four) times daily as needed (for pain).       . insulin glargine (LANTUS) 100 UNIT/ML injection Inject 20 Units into the skin every morning.      Marland Kitchen lisinopril (PRINIVIL,ZESTRIL) 40 MG tablet Take 40 mg by  mouth every morning.      Marland Kitchen LORazepam (ATIVAN) 1 MG tablet Take 1 mg by mouth every 8 (eight) hours as needed for anxiety.      . metoCLOPramide (REGLAN) 10 MG tablet Take 10 mg by mouth 4 (four) times daily.      Marland Kitchen oxyCODONE (OXY IR/ROXICODONE) 5 MG immediate release tablet Take 5 mg by mouth every 8 (eight) hours as needed for pain.      . promethazine (PHENERGAN) 25 MG suppository Place 25 mg rectally every 6 (six) hours as needed for nausea.      . SUMAtriptan (IMITREX) 50 MG tablet Take 50 mg by mouth every 2 (two) hours as needed for migraine. May repeat in 2 hours if headache persists or recurs.        Objective: BP 176/84  Pulse 101  Temp(Src) 98.3 F (36.8 C)  Resp 19  SpO2 100% Exam: General: lying in bed, appears in pain, irritable  HEENT: MMM, NCAT Cardiovascular: RRR, 3/6 holosystolic murmur heard best at RUSB, 2+ dp pulses Respiratory: CTAB, normal WOB Abdomen: soft, nondistended, diffusely tender to light palpation, worst in epigastrum Extremities: WWP, no LE edema Skin: calluses bilaterally on inferior feet, lateral ones are tender Neuro: alert and oriented, no focal deficits, poor insight  Labs and Imaging: CBC BMET   Recent Labs Lab 10/14/13 0942  WBC 5.6  HGB 11.7*  HCT 32.2*  PLT 215    Recent Labs Lab 10/14/13 0942  NA 143  K 4.1  CL 105  CO2 22  BUN 25*  CREATININE 1.61*  GLUCOSE 204*  CALCIUM 10.2      10/13/2013 15:10 10/14/2013 09:42  Alk Phos 81 83  Albumin 4.7 4.5  Lipase 95 (H) 142 (H)  AST 17 23  ALT 10 18  Total Protein 8.3 8.0  Total Bilirubin 0.3 0.4   Beverlyn Roux, MD 10/14/2013, 12:53 PM PGY-1, [place] [facility] pager: [phone], text pages welcome  Teaching Service Addendum. I have seen, review labs and evaluated this pt and agree with Dr. Sherril Cong assessment and plan as is documented on this note.   Signed: D. [place] Philippa Sicks, MD Family Medicine  PGY-3

## 2013-10-14 NOTE — ED Provider Notes (Signed)
Medical screening examination/treatment/procedure(s) were performed by non-physician practitioner and as supervising physician I was immediately available for consultation/collaboration.  Orlie Dakin, MD 10/14/13 [phone]

## 2013-10-14 NOTE — ED Notes (Signed)
Pt requesting ice. Informed that she cannot have any at this time per PA

## 2013-10-14 NOTE — ED Notes (Signed)
PA at bedside.

## 2013-10-14 NOTE — ED Notes (Signed)
Admitting MD at bedside.

## 2013-10-15 DIAGNOSIS — R1115 Cyclical vomiting syndrome unrelated to migraine: Secondary | ICD-10-CM

## 2013-10-15 LAB — CBC
Hemoglobin: 10 g/dL — ABNORMAL LOW (ref 12.0–15.0)
MCH: 31.6 pg (ref 26.0–34.0)
MCHC: 34.8 g/dL (ref 30.0–36.0)
Platelets: 172 10*3/uL (ref 150–400)
RBC: 3.16 MIL/uL — ABNORMAL LOW (ref 3.87–5.11)
RDW: 13.3 % (ref 11.5–15.5)

## 2013-10-15 LAB — BASIC METABOLIC PANEL
BUN: 18 mg/dL (ref 6–23)
Calcium: 9 mg/dL (ref 8.4–10.5)
Creatinine, Ser: 1.63 mg/dL — ABNORMAL HIGH (ref 0.50–1.10)
GFR calc non Af Amer: 36 mL/min — ABNORMAL LOW (ref >90)
Glucose, Bld: 109 mg/dL — ABNORMAL HIGH (ref 70–99)
Sodium: 141 mEq/L (ref 135–145)

## 2013-10-15 LAB — GLUCOSE, CAPILLARY
Glucose-Capillary: 105 mg/dL — ABNORMAL HIGH (ref 70–99)
Glucose-Capillary: 160 mg/dL — ABNORMAL HIGH (ref 70–99)
Glucose-Capillary: 201 mg/dL — ABNORMAL HIGH (ref 70–99)
Glucose-Capillary: 232 mg/dL — ABNORMAL HIGH (ref 70–99)
Glucose-Capillary: 91 mg/dL (ref 70–99)

## 2013-10-15 MED ORDER — ONDANSETRON HCL 4 MG PO TABS
8.0000 mg | ORAL_TABLET | Freq: Three times a day (TID) | ORAL | Status: DC | PRN
  Administered 2013-10-15: 8 mg via ORAL
  Filled 2013-10-15: qty 2

## 2013-10-15 MED ORDER — PROMETHAZINE HCL 25 MG/ML IJ SOLN
25.0000 mg | Freq: Four times a day (QID) | INTRAMUSCULAR | Status: DC
  Administered 2013-10-15 – 2013-10-16 (×4): 25 mg via INTRAVENOUS
  Filled 2013-10-15 (×8): qty 1

## 2013-10-15 MED ORDER — GI COCKTAIL ~~LOC~~
30.0000 mL | Freq: Two times a day (BID) | ORAL | Status: DC | PRN
  Administered 2013-10-15: 30 mL via ORAL
  Filled 2013-10-15 (×2): qty 30

## 2013-10-15 MED ORDER — CARVEDILOL 25 MG PO TABS
25.0000 mg | ORAL_TABLET | Freq: Two times a day (BID) | ORAL | Status: DC
  Administered 2013-10-15 – 2013-10-16 (×2): 25 mg via ORAL
  Filled 2013-10-15 (×4): qty 1

## 2013-10-15 MED ORDER — POLYETHYLENE GLYCOL 3350 17 G PO PACK
17.0000 g | PACK | Freq: Two times a day (BID) | ORAL | Status: DC
  Administered 2013-10-15 – 2013-10-16 (×3): 17 g via ORAL
  Filled 2013-10-15 (×5): qty 1

## 2013-10-15 MED ORDER — METOPROLOL TARTRATE 1 MG/ML IV SOLN
5.0000 mg | Freq: Four times a day (QID) | INTRAVENOUS | Status: DC
  Administered 2013-10-15 (×2): 5 mg via INTRAVENOUS

## 2013-10-15 MED ORDER — SODIUM CHLORIDE 0.45 % IV SOLN
INTRAVENOUS | Status: DC
  Administered 2013-10-15 – 2013-10-16 (×3): via INTRAVENOUS

## 2013-10-15 MED ORDER — DOCUSATE SODIUM 100 MG PO CAPS
100.0000 mg | ORAL_CAPSULE | Freq: Two times a day (BID) | ORAL | Status: DC
  Administered 2013-10-15 – 2013-10-16 (×3): 100 mg via ORAL
  Filled 2013-10-15 (×5): qty 1

## 2013-10-15 MED ORDER — ONDANSETRON 8 MG/NS 50 ML IVPB
8.0000 mg | Freq: Four times a day (QID) | INTRAVENOUS | Status: DC | PRN
  Filled 2013-10-15: qty 8

## 2013-10-15 MED ORDER — SENNA 8.6 MG PO TABS
1.0000 | ORAL_TABLET | Freq: Two times a day (BID) | ORAL | Status: DC
  Administered 2013-10-15 – 2013-10-16 (×3): 8.6 mg via ORAL
  Filled 2013-10-15 (×5): qty 1

## 2013-10-15 MED ORDER — FLEET ENEMA 7-19 GM/118ML RE ENEM
1.0000 | ENEMA | Freq: Once | RECTAL | Status: AC
  Administered 2013-10-15: 10:00:00 1 via RECTAL
  Filled 2013-10-15: qty 1

## 2013-10-15 MED ORDER — OXYCODONE HCL 5 MG PO TABS
5.0000 mg | ORAL_TABLET | ORAL | Status: DC | PRN
  Administered 2013-10-15 – 2013-10-16 (×4): 5 mg via ORAL
  Filled 2013-10-15 (×4): qty 1

## 2013-10-15 MED ORDER — LISINOPRIL 40 MG PO TABS
40.0000 mg | ORAL_TABLET | Freq: Every day | ORAL | Status: DC
  Administered 2013-10-15 – 2013-10-16 (×2): 40 mg via ORAL
  Filled 2013-10-15 (×2): qty 1

## 2013-10-15 NOTE — H&P (Signed)
Family Medicine Teaching Service Attending Note  I interviewed and examined patient [name] [name] and reviewed their tests and x-rays.  I discussed with Dr. Thomes Dinning and reviewed their note for today.  I agree with their assessment and plan.     Additionally  Feeling some better Still epig pain and nausea or vomiting  Feels this is her usual presentation  Treat for cyclic vomiting/pancreatitis Advance diet as tolerated No signs of focal abdominal pathology or infectino

## 2013-10-15 NOTE — Progress Notes (Signed)
Pt a/o, pt received ativan for anxiety at 813 am, pt advanced to clears for lunch, pt had c/o abd pain  PRN oxycode given at 1241pm for pain, pt tolerated diet well and had no c/o nausea, diet advanced to regular diet for dinner and pt c/o indigestion gi cocktail given at 1658, pt appears more comfortable and is hoping to be d/c in the am, will continue to monitor

## 2013-10-15 NOTE — Progress Notes (Signed)
FMTS Attending Daily Note:  [name] MD  [phone] pager  Family Practice pager:  [phone] I have discussed this patient with the resident Dr. Sherril Cong and attending physician Dr. Erin Hearing.  I agree with their findings, assessment, and care plan\

## 2013-10-15 NOTE — Progress Notes (Signed)
Family Medicine Teaching Service Daily Progress Note Intern Pager: [phone]  Patient name: [name] [name] Medical record number: [id number] Date of birth: [birth year] Age: 47 y.o. Gender: female  Primary Care Provider: Conni Slipper, MD Consultants: none Code Status: full  Pt Overview and Major Events to Date:  10/18 - presented to ED with nausea/vomiting and sent home 10/19 - admitted with persistent nausea/vomiting  Assessment and Plan: [name] [name] is a 47 y.o. female presenting with 2 days of nausea/vomiting and worsening abdominal pain . PMH is significant for cyclic vomiting, chronic pancreatitis, migraines, hypertension, DM2, marijuana abuse.   # Cyclic vomiting, abdominal pain, AoC pancreatitis, h/o esophagitis on EGD, hematemesis after repeated emesis and hours of dry heaving: got dilaudid and phenergan x2 in ED, ongoing symptoms - continue ativan prn, scheduled protonix  - schedule phenergan and add zofran prn - lipase up at 142  - monitor CBC for hgb drop, 12.3->11.7->10.0 - monitor for hematochezia/melena/hematemesis  - check FOBT - clear liquids, advance when pain controlled w/o IV meds  - abdominal u/s neg  - no stool since 10/13 - started miralax, senna, colace and fleets enema x1  # DM: home regimen 20u lantus qam, got D50 for CBG 72 last night, am CBGs 91-109  - hold lantus while NPO, cover with SSI   # HTN: lisinopril and coreg at home, up to 180s with severe pain, otherwise 160s - restart home meds this am with tolerating clears  FEN/GI: NPO, MIVF @ 125  Prophylaxis: SCDs (concern for GI bleed), PPI as above   Disposition: pending clinical improvement and tolerating po  Subjective: Feeling better this am, reports still very nauseated but vomiting small amounts without blood and infrequently, pain is better  Objective: Temp:  [98 F (36.7 C)-98.9 F (37.2 C)] 98 F (36.7 C) (10/20 0432) Pulse Rate:  [78-90] 86 (10/20 0432) Resp:  [18-20] 20  (10/20 0432) BP: (162-184)/(68-106) 168/82 mmHg (10/20 1008) SpO2:  [100 %] 100 % (10/20 0432) Weight:  [146 lb 9.7 oz (66.5 kg)-148 lb 12.8 oz (67.495 kg)] 148 lb 12.8 oz (67.495 kg) (10/20 0432) Physical Exam: General: lying in bed, appears in pain, irritable  HEENT: MMM, NCAT  Cardiovascular: RRR, 3/6 holosystolic murmur heard best at RUSB, 2+ dp pulses  Respiratory: CTAB, normal WOB  Abdomen: soft, nondistended, nontender palpation Extremities: WWP, no LE edema  Skin: calluses bilaterally on inferior feet Neuro: alert and oriented, no focal deficits, poor insight  Laboratory:  Recent Labs Lab 10/13/13 1510 10/14/13 0942 10/15/13 0500  WBC 7.3 5.6 4.9  HGB 12.3 11.7* 10.0*  HCT 34.2* 32.2* 28.7*  PLT 234 215 172    Recent Labs Lab 10/13/13 1510 10/14/13 0942 10/15/13 0500  NA 140 143 141  K 4.1 4.1 3.8  CL 105 105 107  CO2 20 22 24   BUN 32* 25* 18  CREATININE 1.90* 1.61* 1.63*  CALCIUM 10.4 10.2 9.0  PROT 8.3 8.0  --   BILITOT 0.3 0.4  --   ALKPHOS 81 83  --   ALT 10 18  --   AST 17 23  --   GLUCOSE 105* 204* 109*   Lipase 95 (10/18) -> 142 (10/19)  Imaging/Diagnostic Tests: Abd u/s: Prior cholecystectomy. Normal pancreas. Heterogeneous echotexture throughout the liver. Questionable nodular contours. Recommend clinical correlation for possibility of cirrhosis. Echogenic kidneys bilaterally compatible with chronic medical renal disease.   [name] Roux, MD 10/15/2013, 1:48 PM PGY-1, [facility] Intern pager: [phone], text  pages welcome

## 2013-10-15 NOTE — Progress Notes (Signed)
Utilization Review Completed.[name] [name] T10/20/2014  

## 2013-10-15 NOTE — Progress Notes (Signed)
Inpatient Diabetes Program Recommendations  AACE/ADA: New Consensus Statement on Inpatient Glycemic Control (2013)  Target Ranges:  Prepandial:   less than 140 mg/dL      Peak postprandial:   less than 180 mg/dL (1-2 hours)      Critically ill patients:  140 - 180 mg/dL   Reason for Visit: Hyperglycemia  Results for [name] [name], [name] [name] (MRN [id number]) as of 10/15/2013 11:47  Ref. Range 10/15/2013 00:13 10/15/2013 04:17 10/15/2013 06:22 10/15/2013 07:49  Glucose-Capillary Latest Range: 70-99 mg/dL 232 (H) 91 105 (H) 107 (H)  Results for [name] [name], [name] [name] (MRN [id number]) as of 10/15/2013 11:47  Ref. Range 09/27/2013 08:41  Hemoglobin A1C Latest Range: <5.7 % 7.7    Inpatient Diabetes Program Recommendations Insulin - Basal: Add Lantus 10 units QHS (1/2 home dose) Correction (SSI): Decrease Novolog to sensitive Q4 hours until diet advanced to CHO mod med HgbA1C: 7.7% Needs tighter control at home Outpatient Referral: May benefit from OP Diabetes Education consult for uncontrolled DM - will order same  Note: Will follow while inpatient. Thank you. Lorenda Peck, RD, LDN, CDE Inpatient Diabetes Coordinator [phone]

## 2013-10-16 DIAGNOSIS — I10 Essential (primary) hypertension: Secondary | ICD-10-CM

## 2013-10-16 DIAGNOSIS — R1013 Epigastric pain: Secondary | ICD-10-CM | POA: Diagnosis not present

## 2013-10-16 DIAGNOSIS — R1115 Cyclical vomiting syndrome unrelated to migraine: Secondary | ICD-10-CM | POA: Diagnosis not present

## 2013-10-16 LAB — CBC
HCT: 27.3 % — ABNORMAL LOW (ref 36.0–46.0)
Hemoglobin: 9.4 g/dL — ABNORMAL LOW (ref 12.0–15.0)
MCH: 31.5 pg (ref 26.0–34.0)
MCHC: 34.4 g/dL (ref 30.0–36.0)
MCV: 91.6 fL (ref 78.0–100.0)
RDW: 13.2 % (ref 11.5–15.5)
WBC: 5.3 10*3/uL (ref 4.0–10.5)

## 2013-10-16 LAB — GLUCOSE, CAPILLARY: Glucose-Capillary: 184 mg/dL — ABNORMAL HIGH (ref 70–99)

## 2013-10-16 MED ORDER — PROMETHAZINE HCL 25 MG PO TABS
25.0000 mg | ORAL_TABLET | Freq: Four times a day (QID) | ORAL | Status: DC
  Administered 2013-10-16: 25 mg via ORAL
  Filled 2013-10-16: qty 1

## 2013-10-16 MED ORDER — PROMETHAZINE HCL 25 MG RE SUPP: 25.0000 mg | Freq: Four times a day (QID) | RECTAL | Status: DC | PRN

## 2013-10-16 MED ORDER — INSULIN GLARGINE 100 UNIT/ML ~~LOC~~ SOLN
10.0000 [IU] | Freq: Every day | SUBCUTANEOUS | Status: DC
Start: 2013-10-16 — End: 2013-10-16
  Filled 2013-10-16: qty 0.1

## 2013-10-16 MED ORDER — INSULIN ASPART 100 UNIT/ML ~~LOC~~ SOLN
0.0000 [IU] | Freq: Three times a day (TID) | SUBCUTANEOUS | Status: DC
  Administered 2013-10-16: 2 [IU] via SUBCUTANEOUS

## 2013-10-16 MED ORDER — INSULIN GLARGINE 100 UNIT/ML ~~LOC~~ SOLN
5.0000 [IU] | Freq: Every day | SUBCUTANEOUS | Status: DC
  Filled 2013-10-16: qty 0.05

## 2013-10-16 MED ORDER — PANTOPRAZOLE SODIUM 40 MG PO TBEC: 40.0000 mg | DELAYED_RELEASE_TABLET | Freq: Every day | ORAL | Status: DC

## 2013-10-16 MED ORDER — SODIUM CHLORIDE 0.9 % IJ SOLN
10.0000 mL | INTRAMUSCULAR | Status: DC | PRN
  Administered 2013-10-16 (×2): 10 mL

## 2013-10-16 MED ORDER — ONDANSETRON HCL 8 MG PO TABS: 8.0000 mg | ORAL_TABLET | Freq: Three times a day (TID) | ORAL | Status: DC | PRN

## 2013-10-16 MED ORDER — OXYCODONE HCL 5 MG PO TABS: 5.0000 mg | ORAL_TABLET | Freq: Three times a day (TID) | ORAL | Status: DC | PRN

## 2013-10-16 MED ORDER — POLYETHYLENE GLYCOL 3350 17 G PO PACK: PACK | ORAL | Status: DC

## 2013-10-16 MED ORDER — LORAZEPAM 1 MG PO TABS: 1.0000 mg | ORAL_TABLET | Freq: Three times a day (TID) | ORAL | Status: DC | PRN

## 2013-10-16 MED ORDER — LORAZEPAM 1 MG PO TABS
1.0000 mg | ORAL_TABLET | Freq: Three times a day (TID) | ORAL | Status: DC | PRN
  Administered 2013-10-16: 1 mg via ORAL
  Filled 2013-10-16: qty 1

## 2013-10-16 MED ORDER — OXYCODONE HCL 5 MG PO TABS
5.0000 mg | ORAL_TABLET | Freq: Four times a day (QID) | ORAL | Status: DC | PRN
  Filled 2013-10-16: qty 1

## 2013-10-16 MED ORDER — SODIUM CHLORIDE 0.9 % IJ SOLN: 10.0000 mL | Freq: Two times a day (BID) | INTRAMUSCULAR | Status: DC

## 2013-10-16 MED ORDER — PROMETHAZINE HCL 25 MG PO TABS: 25.0000 mg | ORAL_TABLET | Freq: Four times a day (QID) | ORAL | Status: DC

## 2013-10-16 MED ORDER — HEPARIN SOD (PORK) LOCK FLUSH 100 UNIT/ML IV SOLN
500.0000 [IU] | INTRAVENOUS | Status: DC
  Filled 2013-10-16: qty 5

## 2013-10-16 MED ORDER — METOCLOPRAMIDE HCL 10 MG PO TABS: 10.0000 mg | ORAL_TABLET | Freq: Four times a day (QID) | ORAL | Status: DC

## 2013-10-16 MED ORDER — HEPARIN SOD (PORK) LOCK FLUSH 100 UNIT/ML IV SOLN
500.0000 [IU] | INTRAVENOUS | Status: DC | PRN
  Administered 2013-10-16: 12:00:00 500 [IU]
  Filled 2013-10-16: qty 5

## 2013-10-16 NOTE — Progress Notes (Signed)
FMTS Attending Daily Note:  [name] Sabal MD  [phone] pager  Family Practice pager:  [phone] I have seen and examined this patient and have reviewed their chart. I have discussed this patient with the resident. I agree with the resident's findings, assessment and care plan.  Additionally:  Eating solid foods without nausea.  Ready for discharge.    Alveda Reasons, MD 10/16/2013

## 2013-10-16 NOTE — Progress Notes (Addendum)
Pt had received d/c RX.  Noted another RX found in chart.  Number in computer which is her sister called just to verify RX and instructed she does not know pt's phone number and will ask pt to call if she call her.    Dr Dianah Field made aware  of other RX with same med in chart.  No new orders given.

## 2013-10-16 NOTE — Progress Notes (Signed)
All d/c instructions explained and given to pt.  Verbalized understanding.  Refused w/c.  Escorted to awaiting transport via NT.  Karie Kirks, Therapist, sports.

## 2013-10-16 NOTE — Progress Notes (Signed)
Pt requesting that she wants to go home and has something personal going on.  Noted to be crying.  Refused to state what was going on and why she is crying.  Dr. Bonner Puna informed and instructed that she will be d/c.  Pt made aware.  Will continue to monitor.  Karie Kirks, Therapist, sports.

## 2013-10-16 NOTE — Progress Notes (Signed)
Pt returned RX  d/c that was not signed and was given appropriate one that had MD signature on it.  Pt  Appreciative.  Karie Kirks, Therapist, sports.

## 2013-10-16 NOTE — Progress Notes (Signed)
The patient complained of one nausea episode during the night, in which she received Zofran with relief.  She slept a large part of the night, but did complain of pain.  Shortly after receiving the Oxy IR, she was able to go back to sleep.

## 2013-10-16 NOTE — Progress Notes (Signed)
Family Medicine Teaching Service Daily Progress Note Intern Pager: [phone]  Patient name: [name] [name] Medical record number: [id number] Date of birth: 04-22-1966 Age: 47 y.o. Gender: female  Primary Care Provider: Conni Slipper, MD Consultants: none Code Status: full  Pt Overview and Major Events to Date:  10/18 - presented to ED with nausea/vomiting and sent home 10/19 - admitted with persistent nausea/vomiting  Assessment and Plan: [name] [name] is a 47 y.o. female presenting with 2 days of nausea/vomiting and worsening abdominal pain . PMH is significant for cyclic vomiting, chronic pancreatitis, migraines, hypertension, DM2, marijuana abuse.   # Cyclic vomiting, abdominal pain, AoC pancreatitis, h/o esophagitis on EGD, hematemesis after repeated emesis and hours of dry heaving: got dilaudid and phenergan x2 in ED, ongoing symptoms - continue ativan prn, scheduled protonix  - continue scheduled phenergan and zofran prn - lipase up at 142  - monitor CBC for hgb drop, 12.3->11.7->10.0->9.4 - monitor for hematochezia/melena/hematemesis  - check FOBT - tolerating regular diet, all meds to po - abdominal u/s neg  - no stool since 10/13 - started miralax, senna, colace and fleets enema x1 - will continue bowel regimen at home and recommend repeat CBC and possible referral for colonoscopy at PCP follow-up  # DM: home regimen 20u lantus qam, CBGs 56-201 over last 24 hours  - lantus held while NPO, eating now, will resume previous regimen on discharge  # HTN: up to 160s with pain, otherwise 110s-130s  - continue home lisinopril and coreg, improving control  FEN/GI: NPO, MIVF @ 125  Prophylaxis: SCDs (concern for GI bleed), PPI as above   Disposition: discharge home  Subjective: Feeling better this am, reports still slightly nauseated but no vomiting, pain is better  Objective: Temp:  [98.1 F (36.7 C)-99.5 F (37.5 C)] 98.1 F (36.7 C) (10/21 0522) Pulse Rate:   [66-70] 66 (10/21 0522) Resp:  [18-20] 18 (10/21 0522) BP: (116-183)/(48-80) 168/80 mmHg (10/21 1011) SpO2:  [99 %-100 %] 99 % (10/21 0522) Weight:  [154 lb 5.2 oz (70 kg)] 154 lb 5.2 oz (70 kg) (10/21 0522) Physical Exam: General: lying in bed, appears in pain, irritable  HEENT: MMM, NCAT  Cardiovascular: RRR, 3/6 holosystolic murmur heard best at RUSB, 2+ dp pulses  Respiratory: CTAB, normal WOB  Abdomen: soft, nondistended, nontender palpation Extremities: WWP, no LE edema  Neuro: alert and oriented, no focal deficits, poor insight  Laboratory:  Recent Labs Lab 10/14/13 0942 10/15/13 0500 10/16/13 1040  WBC 5.6 4.9 5.3  HGB 11.7* 10.0* 9.4*  HCT 32.2* 28.7* 27.3*  PLT 215 172 169    Recent Labs Lab 10/13/13 1510 10/14/13 0942 10/15/13 0500  NA 140 143 141  K 4.1 4.1 3.8  CL 105 105 107  CO2 20 22 24   BUN 32* 25* 18  CREATININE 1.90* 1.61* 1.63*  CALCIUM 10.4 10.2 9.0  PROT 8.3 8.0  --   BILITOT 0.3 0.4  --   ALKPHOS 81 83  --   ALT 10 18  --   AST 17 23  --   GLUCOSE 105* 204* 109*   Lipase 95 (10/18) -> 142 (10/19)  Imaging/Diagnostic Tests: Abd u/s: Prior cholecystectomy. Normal pancreas. Heterogeneous echotexture throughout the liver. Questionable nodular contours. Recommend clinical correlation for possibility of cirrhosis. Echogenic kidneys bilaterally compatible with chronic medical renal disease.  Beverlyn Roux, MD 10/16/2013, 1:48 PM PGY-1, [facility] Intern pager: [phone], text pages welcome

## 2013-10-16 NOTE — Progress Notes (Signed)
Rt. Port -a-cath de-access by iv team prior to pt being d/c home.  Karie Kirks, Therapist, sports.

## 2013-10-16 NOTE — Progress Notes (Signed)
The patient's CBG was 56.  She was given graham crackers and juice.  Upon rechecking her CBG, she was 76.  She ate a sandwich afterwards.  Her CBG at 0030 was 184.

## 2013-10-18 NOTE — ED Provider Notes (Signed)
Medical screening examination/treatment/procedure(s) were conducted as a shared visit with non-physician practitioner(s) or resident and myself. I personally evaluated the patient during the encounter and agree with the findings and plan unless otherwise indicated. I have reviewed any xrays and/ or EKG's with the provider and I agree with interpretation.  Recurrent epig pain and cyclical vomiting. Pt states it is identical to previous the past 11 years. Cannot tolerate po. Pt has been admitted for similar. No cp. Exam dry mm, diffuse upper abd pain, BS present, no rigidity, dry heaving in the room. Plan for fluids, pain meds, labs and likely admission for dehydration, control of vomiting.  Date: 10/14/2013  Rate: 111  Rhythm: sinus tachycardia  QRS Axis: normal  Intervals: normal  ST/T Wave abnormalities: normal  Conduction Disutrbances:none  Narrative Interpretation:  Old EKG Reviewed: rate change  MUSE not working   Mariea Clonts, MD 10/18/13 1450

## 2013-10-23 ENCOUNTER — Inpatient Hospital Stay: Payer: Medicare Other | Admitting: Family Medicine

## 2013-10-25 ENCOUNTER — Encounter: Payer: Self-pay | Admitting: Family Medicine

## 2013-11-15 ENCOUNTER — Ambulatory Visit (INDEPENDENT_AMBULATORY_CARE_PROVIDER_SITE_OTHER): Payer: Medicare Other | Admitting: Family Medicine

## 2013-11-15 ENCOUNTER — Encounter: Payer: Self-pay | Admitting: Family Medicine

## 2013-11-15 VITALS — BP 162/72 | HR 58 | Temp 98.1°F | Ht 61.0 in | Wt 150.0 lb

## 2013-11-15 DIAGNOSIS — N179 Acute kidney failure, unspecified: Secondary | ICD-10-CM

## 2013-11-15 DIAGNOSIS — R1013 Epigastric pain: Secondary | ICD-10-CM

## 2013-11-15 DIAGNOSIS — I10 Essential (primary) hypertension: Secondary | ICD-10-CM

## 2013-11-15 DIAGNOSIS — D649 Anemia, unspecified: Secondary | ICD-10-CM | POA: Insufficient documentation

## 2013-11-15 LAB — COMPREHENSIVE METABOLIC PANEL
AST: 16 U/L (ref 0–37)
Albumin: 4.7 g/dL (ref 3.5–5.2)
Alkaline Phosphatase: 70 U/L (ref 39–117)
BUN: 29 mg/dL — ABNORMAL HIGH (ref 6–23)
Calcium: 10.1 mg/dL (ref 8.4–10.5)
Chloride: 107 mEq/L (ref 96–112)
Creat: 1.81 mg/dL — ABNORMAL HIGH (ref 0.50–1.10)
Glucose, Bld: 147 mg/dL — ABNORMAL HIGH (ref 70–99)
Total Bilirubin: 0.5 mg/dL (ref 0.3–1.2)

## 2013-11-15 LAB — CBC
HCT: 34.6 % — ABNORMAL LOW (ref 36.0–46.0)
Hemoglobin: 11.5 g/dL — ABNORMAL LOW (ref 12.0–15.0)
MCHC: 33.2 g/dL (ref 30.0–36.0)
MCV: 94.5 fL (ref 78.0–100.0)
Platelets: 201 10*3/uL (ref 150–400)
RDW: 15.2 % (ref 11.5–15.5)
WBC: 5.6 10*3/uL (ref 4.0–10.5)

## 2013-11-15 MED ORDER — INSULIN GLARGINE 100 UNIT/ML ~~LOC~~ SOLN: 20.0000 [IU] | Freq: Every morning | SUBCUTANEOUS | Status: DC

## 2013-11-15 MED ORDER — PROMETHAZINE HCL 25 MG PO TABS: 25.0000 mg | ORAL_TABLET | Freq: Four times a day (QID) | ORAL | Status: DC

## 2013-11-15 MED ORDER — OXYCODONE HCL 5 MG PO TABS: 5.0000 mg | ORAL_TABLET | Freq: Three times a day (TID) | ORAL | Status: DC | PRN

## 2013-11-15 MED ORDER — LORAZEPAM 1 MG PO TABS: 1.0000 mg | ORAL_TABLET | Freq: Three times a day (TID) | ORAL | Status: DC | PRN

## 2013-11-15 MED ORDER — CARVEDILOL 25 MG PO TABS: 25.0000 mg | ORAL_TABLET | Freq: Two times a day (BID) | ORAL | Status: DC

## 2013-11-15 MED ORDER — LISINOPRIL 40 MG PO TABS: 40.0000 mg | ORAL_TABLET | Freq: Every morning | ORAL | Status: DC

## 2013-11-15 NOTE — Progress Notes (Signed)
Patient ID: LASHEBA SCHOLTZ, female   DOB: 14-Jan-1966, 47 y.o.   MRN: [id number] Subjective:   CC: Patient presents today for a hospital follow-up.  HPI:   1. Hospital follow-up: nausea/vomiting: Patient was admitted for vomiting and abdominal pain. She has h/o chronic pancreatitis, cyclic vomiting, HTN, DM, marijuana use. She was treated with ativan, phenergan, scheduled protonix. She had an abdominal US with normal pancreas and s/p cholecystectomy.  She reports continued epigastric abdominal pain and back pain this morning, 9/10 and intermittent "sickness" 1.5 weeks after hospitalization and again this past week. She takes phenergan (BID-TID when in pain), ativan (1 tab BID when in pain), and oxycodone 5mg  IR (2-3 tab/day when in pain) but is now out of oxycodone. She is able to tolerate PO but does not feel like eating, eats 2-3 meals/day nevertheless. Reports nausea but denies problems voiding, nausea, fevers, or diarrhea. Has not used marijuana in last 2 days because not feeling well, but does not think it causes her nausea/vomiting.   2. Anemia: Her hgb fell from 11.7 to 9.4 just prior to d/c and she denies menstruation, other bleeding, but does have intermittently dark brown stools. Denies blood in urine.  3.  Review of Systems - Per HPI. Additionally, concerned about her kidneys and about her hgb because these were mentioned as abnormal during recent hospitalization. She was told someone had put in referral to nephrology but has not gotten any calls about this. Denies dysuria.  PMH: Medications - reviewed  SH: Uses marijuana    Objective:  Physical Exam BP 199/80  Pulse 58  Temp(Src) 98.1 F (36.7 C) (Oral)  Ht 5\' 1"  (1.549 m)  Wt 150 lb (68.04 kg)  BMI 28.36 kg/m2 Repeat BP 160s/70s GEN: NAD, anxious-appearing BACK: Left lower back mild CVA tenderness ABD: very tender on palpation, but no difficulty ambulating and does not appear acute, NABS RECTAL: Anus with mild amount of  nonbleeding external hemorrhoids and palpable internal hemorrhoids, no blood on glove, stool card negative NEURO: Normal speech and gait, moves all extremities spontaneously HEENT: Mild icterus    Assessment:     SALIHA MUNZER is a 47 y.o. female here for hospital follow up    Plan:     # See problem list and after visit summary for problem-specific plans. -  Refilled many meds today.  # Health Maintenance: Not discussed.  Follow-up: Follow up in 1 week for f/u abd pain.   Hilton Sinclair, MD [place]

## 2013-11-15 NOTE — Patient Instructions (Signed)
Good to see you today.  For your abdominal pain, continue staying hydrated. If pain gets so bad you cannot walk, or you are unable to keep food or drink down, or you get fevers/chills or other concerning smyptoms, please seek immediate care. - I recommend taking ativan only if pain is severe.  - We will eventually look into other options for anxiety, and reserve ativan for refractory nausea/vomiting. - There is definitely an association between marijuana and cyclic vomiting. It is best to stay off of this.  For your anemia and high creatinine - We are rechecking labs today. - Come back in 1-2 weeks to follow up on these.  I have refilled your medications today.  Take care!   Hilton Sinclair, MD   Cyclic Vomiting Syndrome Cyclic vomiting syndrome is a benign condition in which patients experience bouts or cycles of severe nausea and vomiting that last for hours or even days. The bouts of nausea and vomiting alternate with longer periods of no symptoms and generally good health. Cyclic vomiting syndrome occurs mostly in children, but can affect adults. CAUSES  [facility] has no known cause. Each episode is typically similar to the previous ones. The episodes tend to:   Start at about the same time of day.  Last the same length of time.  Present the same symptoms at the same level of intensity. Cyclic vomiting syndrome can begin at any age in children and adults. Cyclic vomiting syndrome usually starts between the ages of 3 and 7 years. In adults, episodes tend to occur less often than they do in children, but they last longer. Furthermore, the events or situations that trigger episodes in adults cannot always be pinpointed as easily as they can in children. There are 4 phases of cyclic vomiting syndrome: 1. Prodrome. The prodrome phase signals that an episode of nausea and vomiting is about to begin. This phase can last from just a few minutes to several hours. This phase is often marked by  belly (abdominal) pain. Sometimes taking medicine early in the prodrome phase can stop an episode in progress. However, sometimes there is no warning. A person may simply wake up in the middle of the night or early morning and begin vomiting. 2. Episode. The episode phase consists of:  Severe vomiting.  Nausea.  Gagging (retching). 3. Recovery. The recovery phase begins when the nausea and vomiting stop. Healthy color, appetite, and energy return. 4. Symptom-free interval. The symptom-free interval phase is the period between episodes when no symptoms are present. TRIGGERS Episodes can be triggered by an infection or event. Examples of triggers include:  Infections.  Colds, allergies, sinus problems, and the flu.  Eating certain foods such as chocolate or cheese.  Foods with monosodium glutamate (MSG) or preservatives.  Fast foods.  Pre-packaged foods.  Foods with low nutritional value (junk foods).  Overeating.  Eating just before going to bed.  Hot weather.  Dehydration.  Not enough sleep or poor sleep quality.  Physical exhaustion.  Menstruation.  Motion sickness.  Emotional stress (school or home difficulties).  Excitement or stress. SYMPTOMS  The main symptoms of cyclic vomiting syndrome are:  Severe vomiting.  Nausea.  Gagging (retching). Episodes usually begin at night or the first thing in the morning. Episodes may include vomiting or retching up to 5 or 6 times an hour during the worst of the episode. Episodes usually last anywhere from 1 to 4 days. Episodes can last for up to 10 days. Other symptoms include:  Paleness.  Exhaustion.  Listlessness.  Abdominal pain.  Loose stools or diarrhea. Sometimes the nausea and vomiting are so severe that a person appears to be almost unconscious. Sensitivity to light, headache, fever, dizziness, may also accompany an episode. In addition, the vomiting may cause drooling and excessive thirst. Drinking  water usually leads to more vomiting, though the water can dilute the acid in the vomit, making the episode a little less painful. Continuous vomiting can lead to dehydration, which means that the body has lost excessive water and salts. DIAGNOSIS  Cyclic vomiting syndrome is hard to diagnose because there are no clear tests to identify it. A caregiver must diagnose cyclic vomiting syndrome by looking at symptoms and medical history. A caregiver must exclude more common diseases or disorders that can also cause nausea and vomiting. Also, diagnosis takes time because caregivers need to identify a pattern or cycle to the vomiting. TREATMENT  Cyclic vomiting syndrome cannot be cured. Treatment varies, but people with cyclic vomiting syndrome should get plenty of rest and sleep and take medications that prevent, stop, or lessen the vomiting episodes and other symptoms. People whose episodes are frequent and long-lasting may be treated during the symptom-free intervals in an effort to prevent or ease future episodes. The symptom-free phase is a good time to eliminate anything known to trigger an episode. For example, if episodes are brought on by stress or excitement, this period is the time to find ways to reduce stress and stay calm. If sinus problems or allergies cause episodes, those conditions should be treated. The triggers listed above should be avoided or prevented. Because of the similarities between migraine and cyclic vomiting syndrome, caregivers treat some people with severe cyclic vomiting syndrome with drugs that are also used for migraine headaches. The drugs are designed to:  Prevent episodes.  Reduce their frequency.  Lessen their severity. HOME CARE INSTRUCTIONS Once a vomiting episode begins, treatment is supportive. It helps to stay in bed and sleep in a dark, quiet room. Severe nausea and vomiting may require hospitalization and intravenous (IV) fluids to prevent dehydration. Relaxing  medications (sedatives) may help if the nausea continues. Sometimes, during the prodrome phase, it is possible to stop an episode from happening altogether. Only take over-the-counter or prescription medicines for pain, discomfort or fever as directed by your caregiver. Do not give aspirin to children. During the recovery phase, drinking water and replacing lost electrolytes (salts in the blood) are very important. Electrolytes are salts that the body needs to function well and stay healthy. Symptoms during the recovery phase can vary. Some people find that their appetites return to normal immediately, while others need to begin by drinking clear liquids and then move slowly to solid food. RELATED COMPLICATIONS The severe vomiting that defines cyclic vomiting syndrome is a risk factor for several complications:  Dehydration Vomiting causes the body to lose water quickly.  Electrolyte imbalance Vomiting also causes the body to lose the important salts it needs to keep working properly.  Peptic esophagitis The tube that connects the mouth to the stomach (esophagus) becomes injured from the stomach acid that comes up with the vomit.  Hematemesis The esophagus becomes irritated and bleeds, so blood mixes with the vomit.  Mallory-Weiss tear The lower end of the esophagus may tear open or the stomach may bruise from vomiting or retching.  Tooth decay The acid in the vomit can hurt the teeth by corroding the tooth enamel. SEEK MEDICAL CARE IF: You have questions or  problems. Document Released: 02/21/2002 Document Revised: 03/06/2012 Document Reviewed: 03/22/2011 [facility] Patient Information 2014 [place], [place]. Marijuana Abuse and Chemical Dependency WHEN IS DRUG USE A PROBLEM? Problems related to drug use usually begin with abuse of the substance and lead to dependency.  Abuse is repeated use of a drug with recurrent and significant negative consequences. Abuse happens anytime drug use is  interfering with normal living activities including:   Failure to fulfill major obligations at work, school or home (poor work Systems analyst, missing work or school and/or neglecting children and home).  Engaging in activities that are physically dangerous (driving a car or doing recreational activities such as swimming or rock climbing) while under the effects of the drug.  Recurrent drug-related legal problems (arrests for disorderly conduct or assault and battery).  Recurrent social or interpersonal problems caused or increased by the effects of the drug (arguments with family or friends, or physical fights). Dependency has two parts.   You first develop an emotional/psychological dependence. Psychological dependence develops when your mind tells you that the drug is needed. You come to believe it helps you cope with life.  This is usually followed by physical dependence which has developed when continuing increases of drugs are required to get the same feeling or "high." This may result in:  Withdrawal symptoms such as shakes or tremors.  The substance being over a longer period of time than intended.  An ongoing desire, or unsuccessful effort to, cut down or control the use.  Greater amounts of time spent getting the drug, using the drug or recovering from the effects of the drug.  Important social, work or interests and activities are given up or reduced because or drug use.  Substance is used despite knowledge of ongoing physical (ulcers) or psychological (depression) problems. SIGNS OF CHEMICAL DEPENDENCY:  Friends or family say there is a problem.  Fighting when using drugs.  Having blackouts (not remembering what you do while using).  Feel sick from using drugs but continue using.  Lie about use or amounts of drugs used.  Need drugs to get you going.  Need drugs to relate to people or feel comfortable in social situations.  Use drugs to forget problems. A "yes"  answered to any of the above signs of chemical dependency indicates there are problems. The longer the use of drugs continues, the greater the problems will become. If there is a family history of drug or alcohol use it is best not to experiment with drugs. Experimentation leads to tolerance. Addiction is followed by dependency where drugs are now needed not just to get high but to feel normal. Addiction cannot be cured but it can be stopped. This often requires outside help and the care of professionals. Treatment centers are listed in the yellow pages under: Cocaine, Narcotics, and Alcoholics anonymous. Most hospitals and clinics can refer you to a specialized care center. WHAT IS MARIJUANA? Marijuana is a plant which grows wild all over the world. The plant contains many chemicals but the active ingredient of the plant is THC (tetrahydrocannabinol). This is responsible for the "high" perceived by people using the drug. HOW IS MARIJUANA USED? Marijuana is smoked, eaten in brownies or any other food, and drank as a tea. WHAT ARE THE EFFECTS OF MARIJUANA? Marijuana is a nervous system depressant which slows the thinking process. Because of this effect, users think marijuana has a calming effect. Actually what happens is the air carrying tubules in the lung become relaxed and allow more  oxygen to enter. This causes the user to feel high. The blood pressure falls so less blood reaches the brain and the heart speeds up. As the effects wear off the user becomes depressed. Some people become very paranoid during use. They feel as though people are out to get them. Periodic use can interfere with performance at school or work. Generally Marijuana use does not develop into a physical dependence, but it is very habit forming. Marijuana is also seen as a gateway to use of harder drugs. Strong habits such as using Marijuana, as with all drugs and addictions, can only be helped by stopping use of all chemicals. This  is hard but may save your life.  OTHER HEALTH RISKS OF MARIJUANA AND DRUG USE ARE: The increased possibility of getting AIDS or hepatitis (liver inflammation).  HOW TO STAY DRUG FREE ONCE YOU HAVE QUIT USING:  Develop healthy activities and form friends who do not use drugs.  Stay away from the drug scene.  Tell the those who want you to use drugs you have other, better things to do.  Have ready excuses available about why you cannot use.  Attend 12-Step Meetings for support from other recovering people. FOR MORE HELP OR INFORMATION CONTACT YOUR LOCAL CAREGIVER, CLINIC, [place]. Document Released: 12/10/2000 Document Revised: 04/09/2013 Document Reviewed: 01/10/2008 [facility] Patient Information 2014 [place].

## 2013-11-20 ENCOUNTER — Encounter: Payer: Self-pay | Admitting: Family Medicine

## 2013-11-20 NOTE — Assessment & Plan Note (Signed)
Baseline creatinine 1.5-2. - Stable on recheck today. - Possibly refer to neprhology at f/u. - Return 1-2 weeks to discuss.

## 2013-11-20 NOTE — Assessment & Plan Note (Signed)
Worsened today, no fevers, continues using marijuana, able to maintain hydration. - CMET and lipase today. - Continue to hydrate. - Ativan only for severe pain, eventually wean down.  - Reiterated relationship between marijuana and cyclic vomiting and provided educational material, though pt resistant.

## 2013-11-20 NOTE — Assessment & Plan Note (Addendum)
BP elevated today, pt also reportedly in pain. - Recheck, improved but still elevated. - F/u at return visit in 2 weeks. - BMET with Cr stable at 1.8

## 2013-11-20 NOTE — Assessment & Plan Note (Signed)
Improved, Baseline ~12 though variable. Hemorrhoids, also likely some contribution from chronic renal failure. - FOBT today, negative - Internal and external hemorrhoids palpated/seen - Discuss colonoscopy at 49 - f/u 1-2 weeks to discuss

## 2013-12-05 ENCOUNTER — Ambulatory Visit: Payer: Medicare Other | Admitting: Family Medicine

## 2013-12-13 ENCOUNTER — Encounter: Payer: Self-pay | Admitting: Family Medicine

## 2013-12-13 ENCOUNTER — Ambulatory Visit (INDEPENDENT_AMBULATORY_CARE_PROVIDER_SITE_OTHER): Payer: Medicare Other | Admitting: Family Medicine

## 2013-12-13 VITALS — BP 169/90 | HR 79 | Temp 98.4°F | Ht 61.0 in | Wt 149.0 lb

## 2013-12-13 DIAGNOSIS — N189 Chronic kidney disease, unspecified: Secondary | ICD-10-CM | POA: Diagnosis not present

## 2013-12-13 DIAGNOSIS — R1115 Cyclical vomiting syndrome unrelated to migraine: Secondary | ICD-10-CM

## 2013-12-13 DIAGNOSIS — I10 Essential (primary) hypertension: Secondary | ICD-10-CM

## 2013-12-13 DIAGNOSIS — M549 Dorsalgia, unspecified: Secondary | ICD-10-CM

## 2013-12-13 MED ORDER — AMLODIPINE BESYLATE 5 MG PO TABS
5.0000 mg | ORAL_TABLET | Freq: Every day | ORAL | Status: DC
Start: 2013-12-13 — End: 2013-12-19

## 2013-12-13 MED ORDER — LORAZEPAM 1 MG PO TABS: 1.0000 mg | ORAL_TABLET | Freq: Two times a day (BID) | ORAL | Status: DC | PRN

## 2013-12-13 MED ORDER — OXYCODONE HCL 5 MG PO TABS: 5.0000 mg | ORAL_TABLET | Freq: Three times a day (TID) | ORAL | Status: DC | PRN

## 2013-12-13 NOTE — Progress Notes (Signed)
Patient ID: [name] [name], female   DOB: 07-14-1966, 47 y.o.   MRN: [id number] Subjective:   CC: Follow up  HPI:   1. Follow up labwork - Patient wants to discuss labwork from 11/20 office visit. Her creatinine at that time was 1.8. Baseline appears to be 1.5-2. She states a renal referral had been placed by previous PCP but never went through. She would like this re-done. She denies intake of any nsaids or aspirin. Other labwork was mildly low hgb.  2. Med refills - Patient is requesting refill of ativan, oxycodone, and insulin. She has 1 week of oxycodone left. Current ativan prescription and pt's current reported use should lead to her having med until mid-January if using BID as she states. Denies medication side effects.  3. BP - Consistently elevated at office visits, pt states this is due to pain. Does not report blurred vision, dizziness, headache, or fainting. Reports taking medication (coreg, lisinopril) regularly.  4. Back pain - Pt reports >2 weeks of back pain that has moved from right buttock to mid-lower back. Pain is constant 7/10. Cold makes it worse. There are no alleviating factors. Denies fevers, dysuria, incontinence, sudden weakness, change in gait, trauma, or heavy lifting. She reports chills, mild increased urinary frequency, and is unsure if she has any redness/swelling. Has taken oxycodone for this in the past.  Review of Systems - Per HPI.   PMH: Meds reviewed Pt reports hyas 1 week of oxy left Does not use zofran, reglan, protonix, or miralax bc "don't work"  SH: Marijuana use Tobacco use, trying to cut back  Objective:  Physical Exam BP 169/90  Pulse 79  Temp(Src) 98.4 F (36.9 C) (Oral)  Ht 5\' 1"  (1.549 m)  Wt 149 lb (67.586 kg)  BMI 28.17 kg/m2 GEN: NAD HEENT: Atraumatic, normocephalic, neck supple, EOMI, sclera clear  CV: RRR, no murmurs, rubs, or gallops PULM: CTAB, normal effort ABD: Soft, nontender, nondistended, NABS, no organomegaly SKIN:  No rash or cyanosis; warm and well-perfused EXTR: No lower extremity edema or calf tenderness BACK: left intermittent tenderness PSYCH: Mood and affect euthymic, normal rate and volume of speech, tearful on discussion of kidney function NEURO: Awake, alert, no focal deficits grossly, normal speech, normal gait, moves all extremities spontaneously    Assessment:     [name] [name] is a 47 y.o. female here for f/u of labs, back pain, BP f/u, and med refills.    Plan:     # See problem list and after visit summary for problem-specific plans. - Of note, does not report abdominal pain today and abd exam is nontender. Last visit 11/20, she had still had significant abdominal pain from the just-prior admission for cyclic vomiting.  # Health Maintenance: Not discussed  Follow-up: Follow up in 2 months for recheck BP and f/u renal function.     Hilton Sinclair, MD [place]

## 2013-12-13 NOTE — Patient Instructions (Addendum)
I have ordered a kidney referral. You should get a call but if you do not, let us know in 1-2 weeks. Let us get your BP under better control. Start taking norvasc once daily. Follow up with me in 2 months. I am refilling your medications. There should be refills on the lantus. Use the voltaren gel daily and only use percocet if absolutely needed. The ativan you already have is written with an amount that should last you until mid-January if you are only using it twice daily, but I have written the prescription so you can get it early Jan in case you are using 3 some days.

## 2013-12-14 ENCOUNTER — Encounter (HOSPITAL_COMMUNITY): Payer: Self-pay | Admitting: Emergency Medicine

## 2013-12-14 ENCOUNTER — Emergency Department (HOSPITAL_COMMUNITY)
Admission: EM | Admit: 2013-12-14 | Discharge: 2013-12-14 | Disposition: A | Discharge status: Home / Self Care | Payer: Medicare Other | Source: Home / Self Care | Attending: Emergency Medicine | Admitting: Emergency Medicine

## 2013-12-14 ENCOUNTER — Other Ambulatory Visit: Payer: Self-pay

## 2013-12-14 DIAGNOSIS — Z794 Long term (current) use of insulin: Secondary | ICD-10-CM | POA: Insufficient documentation

## 2013-12-14 DIAGNOSIS — I252 Old myocardial infarction: Secondary | ICD-10-CM | POA: Insufficient documentation

## 2013-12-14 DIAGNOSIS — E1149 Type 2 diabetes mellitus with other diabetic neurological complication: Secondary | ICD-10-CM | POA: Diagnosis present

## 2013-12-14 DIAGNOSIS — E860 Dehydration: Secondary | ICD-10-CM

## 2013-12-14 DIAGNOSIS — G8929 Other chronic pain: Secondary | ICD-10-CM | POA: Insufficient documentation

## 2013-12-14 DIAGNOSIS — N183 Chronic kidney disease, stage 3 unspecified: Secondary | ICD-10-CM | POA: Diagnosis present

## 2013-12-14 DIAGNOSIS — R109 Unspecified abdominal pain: Secondary | ICD-10-CM | POA: Insufficient documentation

## 2013-12-14 DIAGNOSIS — R1115 Cyclical vomiting syndrome unrelated to migraine: Secondary | ICD-10-CM | POA: Insufficient documentation

## 2013-12-14 DIAGNOSIS — R011 Cardiac murmur, unspecified: Secondary | ICD-10-CM | POA: Insufficient documentation

## 2013-12-14 DIAGNOSIS — F3289 Other specified depressive episodes: Secondary | ICD-10-CM | POA: Insufficient documentation

## 2013-12-14 DIAGNOSIS — I129 Hypertensive chronic kidney disease with stage 1 through stage 4 chronic kidney disease, or unspecified chronic kidney disease: Secondary | ICD-10-CM | POA: Insufficient documentation

## 2013-12-14 DIAGNOSIS — E1129 Type 2 diabetes mellitus with other diabetic kidney complication: Secondary | ICD-10-CM | POA: Diagnosis present

## 2013-12-14 DIAGNOSIS — Z7982 Long term (current) use of aspirin: Secondary | ICD-10-CM | POA: Insufficient documentation

## 2013-12-14 DIAGNOSIS — F1210 Cannabis abuse, uncomplicated: Secondary | ICD-10-CM | POA: Diagnosis present

## 2013-12-14 DIAGNOSIS — K3184 Gastroparesis: Secondary | ICD-10-CM | POA: Insufficient documentation

## 2013-12-14 DIAGNOSIS — Z95818 Presence of other cardiac implants and grafts: Secondary | ICD-10-CM | POA: Insufficient documentation

## 2013-12-14 DIAGNOSIS — J069 Acute upper respiratory infection, unspecified: Secondary | ICD-10-CM | POA: Diagnosis present

## 2013-12-14 DIAGNOSIS — Z9089 Acquired absence of other organs: Secondary | ICD-10-CM | POA: Insufficient documentation

## 2013-12-14 DIAGNOSIS — Z79899 Other long term (current) drug therapy: Secondary | ICD-10-CM

## 2013-12-14 DIAGNOSIS — F17200 Nicotine dependence, unspecified, uncomplicated: Secondary | ICD-10-CM | POA: Insufficient documentation

## 2013-12-14 DIAGNOSIS — E1142 Type 2 diabetes mellitus with diabetic polyneuropathy: Secondary | ICD-10-CM | POA: Diagnosis present

## 2013-12-14 DIAGNOSIS — Z90710 Acquired absence of both cervix and uterus: Secondary | ICD-10-CM | POA: Insufficient documentation

## 2013-12-14 DIAGNOSIS — F339 Major depressive disorder, recurrent, unspecified: Secondary | ICD-10-CM | POA: Diagnosis present

## 2013-12-14 DIAGNOSIS — N179 Acute kidney failure, unspecified: Secondary | ICD-10-CM | POA: Diagnosis not present

## 2013-12-14 DIAGNOSIS — K2970 Gastritis, unspecified, without bleeding: Secondary | ICD-10-CM | POA: Diagnosis not present

## 2013-12-14 DIAGNOSIS — R1110 Vomiting, unspecified: Secondary | ICD-10-CM | POA: Diagnosis not present

## 2013-12-14 DIAGNOSIS — F411 Generalized anxiety disorder: Secondary | ICD-10-CM | POA: Insufficient documentation

## 2013-12-14 DIAGNOSIS — E43 Unspecified severe protein-calorie malnutrition: Secondary | ICD-10-CM | POA: Diagnosis present

## 2013-12-14 DIAGNOSIS — N058 Unspecified nephritic syndrome with other morphologic changes: Secondary | ICD-10-CM | POA: Diagnosis present

## 2013-12-14 DIAGNOSIS — Z862 Personal history of diseases of the blood and blood-forming organs and certain disorders involving the immune mechanism: Secondary | ICD-10-CM | POA: Insufficient documentation

## 2013-12-14 DIAGNOSIS — Z8701 Personal history of pneumonia (recurrent): Secondary | ICD-10-CM | POA: Insufficient documentation

## 2013-12-14 DIAGNOSIS — K219 Gastro-esophageal reflux disease without esophagitis: Secondary | ICD-10-CM | POA: Insufficient documentation

## 2013-12-14 DIAGNOSIS — Z6827 Body mass index (BMI) 27.0-27.9, adult: Secondary | ICD-10-CM

## 2013-12-14 DIAGNOSIS — F329 Major depressive disorder, single episode, unspecified: Secondary | ICD-10-CM | POA: Insufficient documentation

## 2013-12-14 DIAGNOSIS — D72829 Elevated white blood cell count, unspecified: Secondary | ICD-10-CM | POA: Diagnosis present

## 2013-12-14 HISTORY — DX: Chronic kidney disease, stage 3 (moderate): N18.3

## 2013-12-14 HISTORY — DX: Chronic kidney disease, stage 3 unspecified: N18.30

## 2013-12-14 LAB — CBC WITH DIFFERENTIAL/PLATELET
Basophils Absolute: 0 10*3/uL (ref 0.0–0.1)
Eosinophils Absolute: 0.1 10*3/uL (ref 0.0–0.7)
Hemoglobin: 11.5 g/dL — ABNORMAL LOW (ref 12.0–15.0)
Lymphocytes Relative: 42 % (ref 12–46)
Lymphs Abs: 2.8 10*3/uL (ref 0.7–4.0)
Monocytes Relative: 7 % (ref 3–12)
Neutrophils Relative %: 49 % (ref 43–77)
Platelets: 188 10*3/uL (ref 150–400)
RBC: 3.61 MIL/uL — ABNORMAL LOW (ref 3.87–5.11)
RDW: 13.2 % (ref 11.5–15.5)
WBC: 6.7 10*3/uL (ref 4.0–10.5)

## 2013-12-14 LAB — COMPREHENSIVE METABOLIC PANEL
ALT: 13 U/L (ref 0–35)
Alkaline Phosphatase: 73 U/L (ref 39–117)
CO2: 24 mEq/L (ref 19–32)
Calcium: 9.6 mg/dL (ref 8.4–10.5)
Chloride: 105 mEq/L (ref 96–112)
GFR calc Af Amer: 30 mL/min — ABNORMAL LOW (ref >90)
GFR calc non Af Amer: 26 mL/min — ABNORMAL LOW (ref >90)
Glucose, Bld: 96 mg/dL (ref 70–99)
Potassium: 4.2 mEq/L (ref 3.5–5.1)
Sodium: 140 mEq/L (ref 135–145)
Total Bilirubin: 0.2 mg/dL — ABNORMAL LOW (ref 0.3–1.2)
Total Protein: 7.5 g/dL (ref 6.0–8.3)

## 2013-12-14 MED ORDER — HYDROMORPHONE HCL PF 1 MG/ML IJ SOLN
1.0000 mg | Freq: Once | INTRAMUSCULAR | Status: AC
  Administered 2013-12-14: 1 mg via INTRAVENOUS
  Filled 2013-12-14: qty 1

## 2013-12-14 MED ORDER — METOCLOPRAMIDE HCL 5 MG/ML IJ SOLN
10.0000 mg | Freq: Once | INTRAMUSCULAR | Status: AC
  Administered 2013-12-14: 10 mg via INTRAVENOUS
  Filled 2013-12-14: qty 2

## 2013-12-14 MED ORDER — HEPARIN SOD (PORK) LOCK FLUSH 100 UNIT/ML IV SOLN
500.0000 [IU] | Freq: Once | INTRAVENOUS | Status: AC
  Administered 2013-12-14: 500 [IU]
  Filled 2013-12-14: qty 5

## 2013-12-14 MED ORDER — SODIUM CHLORIDE 0.9 % IJ SOLN
10.0000 mL | INTRAMUSCULAR | Status: DC | PRN
  Administered 2013-12-14: 10 mL

## 2013-12-14 MED ORDER — SODIUM CHLORIDE 0.9 % IV BOLUS (SEPSIS)
1000.0000 mL | Freq: Once | INTRAVENOUS | Status: AC
  Administered 2013-12-14: 1000 mL via INTRAVENOUS

## 2013-12-14 MED ORDER — PROMETHAZINE HCL 25 MG/ML IJ SOLN
25.0000 mg | Freq: Once | INTRAMUSCULAR | Status: AC
  Administered 2013-12-14: 25 mg via INTRAVENOUS
  Filled 2013-12-14: qty 1

## 2013-12-14 MED ORDER — LORAZEPAM 2 MG/ML IJ SOLN
1.0000 mg | Freq: Once | INTRAMUSCULAR | Status: AC
  Administered 2013-12-14: 1 mg via INTRAVENOUS
  Filled 2013-12-14: qty 1

## 2013-12-14 NOTE — ED Notes (Signed)
Pt from home via [facility] c/o of emesis and abdominal pain.  Hx of cyclic vomit syndrome. Pt A&O.

## 2013-12-14 NOTE — ED Provider Notes (Signed)
CSN: [id number]     Arrival date & time 12/14/13  1011 History   First MD Initiated Contact with Patient 12/14/13 1042     Chief Complaint  Patient presents with  . Emesis   (Consider location/radiation/quality/duration/timing/severity/associated sxs/prior Treatment) HPI  47 year old female who is well-known to ER with history of pancreatitis, diabetes, cyclic vomiting syndrome presents complaining of diffuse abdominal pain with associate nausea vomiting. Patient was brought in via EMS. Patient states she normally does not eat processed meat. Last night she had a hotdog stand and this morning she was awoke with sharp crampy abdominal pain with associate nausea or vomiting. Has vomited multiple times. Vomitus is nonbloody nonbilious. Abdominal pain as intense. She tries taking her home medication but unable to keep anything down. Patient states the last time that she had this type of abdominal pain was in October. Patient states when pain is severe she normally  required Ativan, Phenergan, and Dilaudid as treatment. Otherwise patient denies fever, headache, back pain, dysuria, or rash. Denies any recent alcohol use.  Past Medical History  Diagnosis Date  . PANCREATITIS 05/09/2008  . Cyclic vomiting syndrome   . Hypertension   . Complication of anesthesia     "problems waking up"  . Heart murmur   . Myocardial infarction 07/2007    "they say I've had a silent one; I don't know"  . Pneumonia   . Anemia   . Blood transfusion   . GERD (gastroesophageal reflux disease)   . Anxiety   . Depression   . Smoker   . Marijuana smoker, continuous   . NEUROPATHY, DIABETIC 02/23/2007  . Type II diabetes mellitus   . Diabetic gastroparesis     /e-chart  . Chronic kidney disease (CKD), stage III (moderate)    Past Surgical History  Procedure Laterality Date  . Port-a-cath placement  ~ 2008    right chest; "poor access; frequent hospitalizations"  . Laparotomy and lysis of adhesions    .  Abdominal hysterectomy  02/2002  . Cholecystectomy  1980's  . Cardiac catheterization    . Dilation and evacuation  X3  . Left hand surgery    . Cataract extraction w/ intraocular lens  implant, bilateral    . Colonoscopy    . Upper gi endoscopy     Family History  Problem Relation Age of Onset  . Diabetes type II Mother   . Diabetes Mother   . Hypertension Mother   . Diabetes type II Sister   . Diabetes Sister   . Hypertension Sister   . Hypertension Brother    History  Substance Use Topics  . Smoking status: Current Some Day Smoker -- 0.50 packs/day for 22 years    Types: Cigarettes  . Smokeless tobacco: Never Used  . Alcohol Use: No   OB History   Grav Para Term Preterm Abortions TAB SAB Ect Mult Living                 Review of Systems  All other systems reviewed and are negative.    Allergies  Bee venom; Acetaminophen; Novolog; and Erythromycin  Home Medications   Current Outpatient Rx  Name  Route  Sig  Dispense  Refill  . aspirin EC 81 MG tablet   Oral   Take 81 mg by mouth every morning.         . carvedilol (COREG) 25 MG tablet   Oral   Take 1 tablet (25 mg total) by mouth 2 (two)  times daily with a meal.   60 tablet   5   . diclofenac sodium (VOLTAREN) 1 % GEL   Topical   Apply 2 g topically 4 (four) times daily as needed (for pain).          . insulin glargine (LANTUS) 100 UNIT/ML injection   Subcutaneous   Inject 0.2 mLs (20 Units total) into the skin every morning.   10 mL   5     Please fill enough for 1 month supply   . lisinopril (PRINIVIL,ZESTRIL) 40 MG tablet   Oral   Take 1 tablet (40 mg total) by mouth every morning.   30 tablet   5   . LORazepam (ATIVAN) 1 MG tablet   Oral   Take 1 tablet (1 mg total) by mouth 2 (two) times daily as needed for anxiety. Fill on or after 12/28/2013.   60 tablet   0   . metoCLOPramide (REGLAN) 10 MG tablet   Oral   Take 1 tablet (10 mg total) by mouth 4 (four) times daily.   120  tablet   0   . oxyCODONE (OXY IR/ROXICODONE) 5 MG immediate release tablet   Oral   Take 1 tablet (5 mg total) by mouth every 8 (eight) hours as needed.   30 tablet   0   . pantoprazole (PROTONIX) 40 MG tablet   Oral   Take 1 tablet (40 mg total) by mouth daily at 12 noon.   30 tablet   0   . polyethylene glycol (MIRALAX / GLYCOLAX) packet      Take 1 cap twice a day, adjust as needed to produce 1-2 soft bowel movements per day.   100 each   0   . promethazine (PHENERGAN) 25 MG suppository   Rectal   Place 1 suppository (25 mg total) rectally every 6 (six) hours as needed for nausea (Take only when unable to keep oral phenergan down).   12 each   0   . promethazine (PHENERGAN) 25 MG tablet   Oral   Take 1 tablet (25 mg total) by mouth every 6 (six) hours.   120 tablet   1   . SUMAtriptan (IMITREX) 50 MG tablet   Oral   Take 50 mg by mouth every 2 (two) hours as needed for migraine. May repeat in 2 hours if headache persists or recurs.         Marland Kitchen amLODipine (NORVASC) 5 MG tablet   Oral   Take 1 tablet (5 mg total) by mouth daily.   90 tablet   0   . ondansetron (ZOFRAN) 8 MG tablet   Oral   Take 1 tablet (8 mg total) by mouth every 8 (eight) hours as needed.   60 tablet   0    BP 150/81  Pulse 80  Temp(Src) 98 F (36.7 C) (Oral)  Resp 18  SpO2 100% Physical Exam  Nursing note and vitals reviewed. Constitutional: She is oriented to person, place, and time. She appears well-developed and well-nourished. No distress (patient is tearful in bed but appears nontoxic.).  HENT:  Head: Atraumatic.  Mouth/Throat: Oropharynx is clear and moist.  Eyes: Conjunctivae are normal.  Neck: Neck supple.  Cardiovascular: Normal rate and regular rhythm.   Pulmonary/Chest: Effort normal and breath sounds normal.  Abdominal: Soft. There is tenderness (Diffuse abdominal tenderness most significant to the epigastrium. Patient does not want me to push down on her abdomen.).   Musculoskeletal: She exhibits  no edema.  Neurological: She is alert and oriented to person, place, and time.  Skin: No rash noted.  Psychiatric: She has a normal mood and affect.    ED Course  Procedures (including critical care time)  11:08 AM Pt is well know to our ER.  Has hx of chronic abd pain and cyclical vomits.  Her sxs started this morning.  Does does not appear toxic, is afebrile.  Work up initiated, IVF, antinausea medication given.  Do not think narcotic pain med is indicative at this time, especially in the setting of chronic abd pain.    1:03 PM Patient persistently requesting for pain medication from the nurse, however when I went to re-exam, she is sleeping restfully.    2:50 PM Patient has evidence of renal insufficiency with BUN 38, creatinine 2.14, higher than her normal baseline. Is likely reflect a recurrent nausea and vomiting. Patient however states she is feeling better after receiving medication. Her pain is currently 3/10. She is currently able to tolerates some small amount of fluid by mouth. Otherwise patient is stable for discharge. Patient agrees to follow up with her PCP for further care. Return precautions discussed.    Labs Review Labs Reviewed  CBC WITH DIFFERENTIAL - Abnormal; Notable for the following:    RBC 3.61 (*)    Hemoglobin 11.5 (*)    HCT 32.5 (*)    All other components within normal limits  COMPREHENSIVE METABOLIC PANEL - Abnormal; Notable for the following:    BUN 38 (*)    Creatinine, Ser 2.14 (*)    Total Bilirubin 0.2 (*)    GFR calc non Af Amer 26 (*)    GFR calc Af Amer 30 (*)    All other components within normal limits  LIPASE, BLOOD  URINALYSIS, ROUTINE W REFLEX MICROSCOPIC   Imaging Review No results found.  EKG Interpretation   None       MDM   1. Cyclic vomiting syndrome   2. Dehydration    BP 156/91  Pulse 88  Temp(Src) 98 F (36.7 C) (Oral)  Resp 18  SpO2 100%  I have reviewed nursing notes and  vital signs. I reviewed available ER/hospitalization records thought the EMR     Domenic Moras, [place] 12/14/13 1459

## 2013-12-14 NOTE — ED Notes (Signed)
Patient stated that she can not get an urine sample at this time will try later

## 2013-12-15 ENCOUNTER — Inpatient Hospital Stay (HOSPITAL_COMMUNITY)
Admission: EM | Admit: 2013-12-15 | Discharge: 2013-12-19 | DRG: 682 | Disposition: A | Discharge status: Home / Self Care | Payer: Medicare Other | Attending: Family Medicine | Admitting: Family Medicine

## 2013-12-15 ENCOUNTER — Encounter (HOSPITAL_COMMUNITY): Payer: Self-pay | Admitting: Emergency Medicine

## 2013-12-15 DIAGNOSIS — F1290 Cannabis use, unspecified, uncomplicated: Secondary | ICD-10-CM

## 2013-12-15 DIAGNOSIS — R1013 Epigastric pain: Secondary | ICD-10-CM

## 2013-12-15 DIAGNOSIS — Z609 Problem related to social environment, unspecified: Secondary | ICD-10-CM

## 2013-12-15 DIAGNOSIS — K219 Gastro-esophageal reflux disease without esophagitis: Secondary | ICD-10-CM

## 2013-12-15 DIAGNOSIS — I10 Essential (primary) hypertension: Secondary | ICD-10-CM

## 2013-12-15 DIAGNOSIS — N179 Acute kidney failure, unspecified: Secondary | ICD-10-CM | POA: Diagnosis not present

## 2013-12-15 DIAGNOSIS — R1115 Cyclical vomiting syndrome unrelated to migraine: Secondary | ICD-10-CM | POA: Diagnosis not present

## 2013-12-15 DIAGNOSIS — E43 Unspecified severe protein-calorie malnutrition: Secondary | ICD-10-CM | POA: Insufficient documentation

## 2013-12-15 DIAGNOSIS — N189 Chronic kidney disease, unspecified: Secondary | ICD-10-CM

## 2013-12-15 LAB — BASIC METABOLIC PANEL
BUN: 40 mg/dL — ABNORMAL HIGH (ref 6–23)
Calcium: 9.9 mg/dL (ref 8.4–10.5)
Chloride: 88 mEq/L — ABNORMAL LOW (ref 96–112)
Creatinine, Ser: 2.43 mg/dL — ABNORMAL HIGH (ref 0.50–1.10)
GFR calc Af Amer: 26 mL/min — ABNORMAL LOW (ref >90)
GFR calc non Af Amer: 22 mL/min — ABNORMAL LOW (ref >90)
Glucose, Bld: 260 mg/dL — ABNORMAL HIGH (ref 70–99)
Potassium: 3.4 mEq/L — ABNORMAL LOW (ref 3.5–5.1)

## 2013-12-15 LAB — GLUCOSE, CAPILLARY: Glucose-Capillary: 186 mg/dL — ABNORMAL HIGH (ref 70–99)

## 2013-12-15 MED ORDER — ONDANSETRON HCL 4 MG PO TABS
4.0000 mg | ORAL_TABLET | Freq: Four times a day (QID) | ORAL | Status: DC | PRN
  Filled 2013-12-15: qty 1

## 2013-12-15 MED ORDER — LORAZEPAM 2 MG/ML IJ SOLN
1.0000 mg | Freq: Once | INTRAMUSCULAR | Status: AC
  Administered 2013-12-15: 1 mg via INTRAVENOUS
  Filled 2013-12-15: qty 1

## 2013-12-15 MED ORDER — SODIUM CHLORIDE 0.9 % IV BOLUS (SEPSIS)
1000.0000 mL | Freq: Once | INTRAVENOUS | Status: AC
  Administered 2013-12-15: 1000 mL via INTRAVENOUS

## 2013-12-15 MED ORDER — SODIUM CHLORIDE 0.9 % IV SOLN: INTRAVENOUS | Status: AC

## 2013-12-15 MED ORDER — INSULIN GLARGINE 100 UNIT/ML ~~LOC~~ SOLN
10.0000 [IU] | Freq: Every day | SUBCUTANEOUS | Status: DC
  Administered 2013-12-15 – 2013-12-18 (×3): 10 [IU] via SUBCUTANEOUS
  Filled 2013-12-15 (×5): qty 0.1

## 2013-12-15 MED ORDER — MORPHINE SULFATE 2 MG/ML IJ SOLN
2.0000 mg | INTRAMUSCULAR | Status: DC | PRN
  Administered 2013-12-15 – 2013-12-16 (×4): 2 mg via INTRAVENOUS
  Filled 2013-12-15 (×4): qty 1

## 2013-12-15 MED ORDER — INSULIN ASPART 100 UNIT/ML ~~LOC~~ SOLN
0.0000 [IU] | Freq: Every day | SUBCUTANEOUS | Status: DC
  Administered 2013-12-18: 3 [IU] via SUBCUTANEOUS

## 2013-12-15 MED ORDER — LORAZEPAM 1 MG PO TABS
1.0000 mg | ORAL_TABLET | Freq: Two times a day (BID) | ORAL | Status: DC | PRN
  Filled 2013-12-15: qty 1

## 2013-12-15 MED ORDER — ONDANSETRON HCL 4 MG/2ML IJ SOLN
4.0000 mg | Freq: Four times a day (QID) | INTRAMUSCULAR | Status: DC | PRN
  Administered 2013-12-15 – 2013-12-16 (×4): 4 mg via INTRAVENOUS
  Filled 2013-12-15 (×4): qty 2

## 2013-12-15 MED ORDER — METOPROLOL TARTRATE 1 MG/ML IV SOLN
10.0000 mg | Freq: Two times a day (BID) | INTRAVENOUS | Status: DC
  Administered 2013-12-15 – 2013-12-16 (×2): 10 mg via INTRAVENOUS
  Filled 2013-12-15 (×4): qty 10

## 2013-12-15 MED ORDER — SENNA 8.6 MG PO TABS
1.0000 | ORAL_TABLET | Freq: Two times a day (BID) | ORAL | Status: DC
  Administered 2013-12-16 – 2013-12-18 (×6): 8.6 mg via ORAL
  Filled 2013-12-15 (×9): qty 1

## 2013-12-15 MED ORDER — INSULIN ASPART 100 UNIT/ML ~~LOC~~ SOLN
0.0000 [IU] | Freq: Three times a day (TID) | SUBCUTANEOUS | Status: DC
  Administered 2013-12-17 – 2013-12-18 (×2): 3 [IU] via SUBCUTANEOUS

## 2013-12-15 MED ORDER — HEPARIN SODIUM (PORCINE) 5000 UNIT/ML IJ SOLN
5000.0000 [IU] | Freq: Three times a day (TID) | INTRAMUSCULAR | Status: DC
  Administered 2013-12-15 – 2013-12-19 (×11): 5000 [IU] via SUBCUTANEOUS
  Filled 2013-12-15 (×14): qty 1

## 2013-12-15 MED ORDER — GI COCKTAIL ~~LOC~~
30.0000 mL | Freq: Once | ORAL | Status: AC
  Administered 2013-12-15: 30 mL via ORAL
  Filled 2013-12-15: qty 30

## 2013-12-15 MED ORDER — PROMETHAZINE HCL 25 MG/ML IJ SOLN
25.0000 mg | Freq: Once | INTRAMUSCULAR | Status: AC
  Administered 2013-12-15: 25 mg via INTRAVENOUS
  Filled 2013-12-15: qty 1

## 2013-12-15 MED ORDER — SODIUM CHLORIDE 0.9 % IV SOLN
INTRAVENOUS | Status: DC
  Administered 2013-12-15 – 2013-12-18 (×6): via INTRAVENOUS

## 2013-12-15 MED ORDER — PANTOPRAZOLE SODIUM 40 MG IV SOLR
40.0000 mg | Freq: Two times a day (BID) | INTRAVENOUS | Status: DC
  Administered 2013-12-15 – 2013-12-18 (×6): 40 mg via INTRAVENOUS
  Filled 2013-12-15 (×8): qty 40

## 2013-12-15 MED ORDER — PROMETHAZINE HCL 25 MG/ML IJ SOLN
12.5000 mg | Freq: Four times a day (QID) | INTRAMUSCULAR | Status: DC | PRN
  Administered 2013-12-16 – 2013-12-18 (×8): 12.5 mg via INTRAVENOUS
  Filled 2013-12-15 (×8): qty 1

## 2013-12-15 NOTE — Progress Notes (Signed)
Pt admitted to the unit. Pt is throwing up, alert and oriented per baseline. Oriented to room, staff, and call bell. Educated to call for any assistance. Bed in lowest position, call bell within reach- will continue to monitor.

## 2013-12-15 NOTE — ED Notes (Signed)
Attempted to call report to 5N.  Nurse will call back.

## 2013-12-15 NOTE — ED Notes (Signed)
PORT-A-CATH  accessed

## 2013-12-15 NOTE — ED Notes (Signed)
The  Pt is c/o vomiting chronic for 11 years.  This episode started yesterday and she was seen here for the same.  She is no better.  lmp none

## 2013-12-15 NOTE — ED Provider Notes (Signed)
Medical screening examination/treatment/procedure(s) were performed by non-physician practitioner and as supervising physician I was immediately available for consultation/collaboration.  EKG Interpretation   None        Jasper Riling. Alvino Chapel, MD 12/15/13 [phone]

## 2013-12-15 NOTE — ED Provider Notes (Addendum)
CSN: [id number]     Arrival date & time 12/15/13  1615 History   First MD Initiated Contact with Patient 12/15/13 1634     Chief Complaint  Patient presents with  . Emesis   (Consider location/radiation/quality/duration/timing/severity/associated sxs/prior Treatment) HPI Pt well known to this ED with [facility] seen in the ED yesterday for same, had slight increase in creatinine on labs, but otherwise was doing well, sleeping and tolerating fluids prior to discharge. She reports return of emesis after leaving the ED continued through the day. She reports severe diffuse abdominal pain as well. No fever, no blood in emesis. Reports frequent marijuana use. Symptoms are improved in hot shower/bath.   Past Medical History  Diagnosis Date  . PANCREATITIS 05/09/2008  . Cyclic vomiting syndrome   . Hypertension   . Complication of anesthesia     "problems waking up"  . Heart murmur   . Myocardial infarction 07/2007    "they say I've had a silent one; I don't know"  . Pneumonia   . Anemia   . Blood transfusion   . GERD (gastroesophageal reflux disease)   . Anxiety   . Depression   . Smoker   . Marijuana smoker, continuous   . NEUROPATHY, DIABETIC 02/23/2007  . Type II diabetes mellitus   . Diabetic gastroparesis     /e-chart  . Chronic kidney disease (CKD), stage III (moderate)    Past Surgical History  Procedure Laterality Date  . Port-a-cath placement  ~ 2008    right chest; "poor access; frequent hospitalizations"  . Laparotomy and lysis of adhesions    . Abdominal hysterectomy  02/2002  . Cholecystectomy  1980's  . Cardiac catheterization    . Dilation and evacuation  X3  . Left hand surgery    . Cataract extraction w/ intraocular lens  implant, bilateral    . Colonoscopy    . Upper gi endoscopy     Family History  Problem Relation Age of Onset  . Diabetes type II Mother   . Diabetes Mother   . Hypertension Mother   . Diabetes type II Sister   . Diabetes Sister   .  Hypertension Sister   . Hypertension Brother    History  Substance Use Topics  . Smoking status: Current Some Day Smoker -- 0.50 packs/day for 22 years    Types: Cigarettes  . Smokeless tobacco: Never Used  . Alcohol Use: No   OB History   Grav Para Term Preterm Abortions TAB SAB Ect Mult Living                 Review of Systems All other systems reviewed and are negative except as noted in HPI.   Allergies  Bee venom; Acetaminophen; Novolog; and Erythromycin  Home Medications   Current Outpatient Rx  Name  Route  Sig  Dispense  Refill  . amLODipine (NORVASC) 5 MG tablet   Oral   Take 1 tablet (5 mg total) by mouth daily.   90 tablet   0   . aspirin EC 81 MG tablet   Oral   Take 81 mg by mouth every morning.         Marland Kitchen BOOSTRIX 5-2.5-18.5 injection   Intramuscular   Inject 0.5 mLs into the muscle once.         . carvedilol (COREG) 25 MG tablet   Oral   Take 1 tablet (25 mg total) by mouth 2 (two) times daily with a meal.  60 tablet   5   . diclofenac sodium (VOLTAREN) 1 % GEL   Topical   Apply 2 g topically 4 (four) times daily as needed (for pain).          . insulin glargine (LANTUS) 100 UNIT/ML injection   Subcutaneous   Inject 0.2 mLs (20 Units total) into the skin every morning.   10 mL   5     Please fill enough for 1 month supply   . lisinopril (PRINIVIL,ZESTRIL) 40 MG tablet   Oral   Take 1 tablet (40 mg total) by mouth every morning.   30 tablet   5   . LORazepam (ATIVAN) 1 MG tablet   Oral   Take 1 tablet (1 mg total) by mouth 2 (two) times daily as needed for anxiety. Fill on or after 12/28/2013.   60 tablet   0   . metoCLOPramide (REGLAN) 10 MG tablet   Oral   Take 1 tablet (10 mg total) by mouth 4 (four) times daily.   120 tablet   0   . ondansetron (ZOFRAN) 8 MG tablet   Oral   Take 1 tablet (8 mg total) by mouth every 8 (eight) hours as needed.   60 tablet   0   . oxyCODONE (OXY IR/ROXICODONE) 5 MG immediate release  tablet   Oral   Take 1 tablet (5 mg total) by mouth every 8 (eight) hours as needed.   30 tablet   0   . pantoprazole (PROTONIX) 40 MG tablet   Oral   Take 1 tablet (40 mg total) by mouth daily at 12 noon.   30 tablet   0   . polyethylene glycol (MIRALAX / GLYCOLAX) packet      Take 1 cap twice a day, adjust as needed to produce 1-2 soft bowel movements per day.   100 each   0   . promethazine (PHENERGAN) 25 MG suppository   Rectal   Place 1 suppository (25 mg total) rectally every 6 (six) hours as needed for nausea (Take only when unable to keep oral phenergan down).   12 each   0   . promethazine (PHENERGAN) 25 MG tablet   Oral   Take 1 tablet (25 mg total) by mouth every 6 (six) hours.   120 tablet   1   . SUMAtriptan (IMITREX) 50 MG tablet   Oral   Take 50 mg by mouth every 2 (two) hours as needed for migraine. May repeat in 2 hours if headache persists or recurs.          BP 176/93  Pulse 120  Resp 14  SpO2 98% Physical Exam  Nursing note and vitals reviewed. Constitutional: She is oriented to person, place, and time. She appears well-developed and well-nourished.  HENT:  Head: Normocephalic and atraumatic.  Eyes: EOM are normal. Pupils are equal, round, and reactive to light.  Neck: Normal range of motion. Neck supple.  Cardiovascular: Normal rate, normal heart sounds and intact distal pulses.   Pulmonary/Chest: Effort normal and breath sounds normal.  Abdominal: Bowel sounds are normal. She exhibits no distension. There is tenderness (diffuse tenderness, no peritoneal signs). There is no rebound and no guarding.  Musculoskeletal: Normal range of motion. She exhibits no edema and no tenderness.  Neurological: She is alert and oriented to person, place, and time. She has normal strength. No cranial nerve deficit or sensory deficit.  Skin: Skin is warm and dry. No rash noted.  Psychiatric: She has a normal mood and affect.    ED Course  Procedures  (including critical care time) Labs Review Labs Reviewed  BASIC METABOLIC PANEL - Abnormal; Notable for the following:    Potassium 3.4 (*)    Chloride 88 (*)    Glucose, Bld 260 (*)    BUN 40 (*)    Creatinine, Ser 2.43 (*)    GFR calc non Af Amer 22 (*)    GFR calc Af Amer 26 (*)    All other components within normal limits   Imaging Review No results found.  EKG Interpretation   None       MDM   1. Acute renal failure   2. Cyclic vomiting syndrome     Pt sleeping comfortably, no vomiting in the ED but has been spitting into an emesis bag. Creatinine worsening, will admit for hydration. Consider Cannabinoid Hyperemesis Syndrome as a component to her symptoms. Pt advised to avoid marijuana use.     Woodley Petzold B. Karle Starch, MD 12/15/13 [place] Karle Starch, MD 12/25/13 [phone]

## 2013-12-15 NOTE — H&P (Signed)
[facility] Admission History and Physical Service Pager: [phone]  Patient name: [name] [name] Medical record number: [id number] Date of birth: 12-31-1965 Age: 47 y.o. Gender: female  Primary Care Provider: Conni Slipper, MD Consultants: None Code Status: Full  Chief Complaint: n/v/abdominal pain  Assessment and Plan: [name] [name] is a 47 y.o. female presenting with n/v/abdominal pain X2 days. PMH is significant for pancreatitis, diabetes, cyclic vomiting syndrome.  # Cyclic vomiting, abdominal pain: ddx includes cyclic vomiting vs gastritis vs PUD vs gastroenteritis vs pancreatitis. Pt with acute onset vomiting after recent ingestion of marijuana, likely related to sx. Also with hotdog intake and does not usually eat processed or red meats. Pinpoint tender over epigastrium but no signs of acute abdomen. Hgb stable at 11.5 (same 1 month ago). No recent sick contacts. Did attest to some questionable diarrhea? But pt afebrile with no WBC. Has hx of pancreatitis but lipase 44 yesterday. Also s/p chole and tbili 0.2 yesterday. No evidence of intracranial pathology at this time, nonfocal neuro exam.  - Admit to med-surg inpatient, [facility] Service, attending Dr Lindell Noe - GI cocktail now - zofran and scheduled protonix - start ativan and phenergan prn - monitor CBC for hgb drop given "red" vomit, monitor for hematochezia/melena  - gastro-occult/fecal occult - NPO with ice chips, advance when pain controlled w/o IV meds  - morphine 2mg  q2 - consider abdominal u/s to eval for new GI pathology given severe pain/tenderness. Think unlikely to be perf at this time. -consider GI consult if unimproved and concern for PUD increases  -UDS, blood alcohol level. - heating pad prn - Re-emphasized possible correlation of symptoms to marijuana use and potential great benefit if she quits of ending this cycle.  #AKI on stage 3 CKD: has both diabetic  nephropathy and gastroparesis per hx, has not yet seen renal; baseline CR 1.6-1.8. Cr continuing to trend up most likely related to GI losses and prerenal etiology. -rehydration MIVF @125  -follow with BMETs -Consider FENa and renal consult if not improving.  #tachycardia and epigastric discomfort: likely related to abdominal pain with some component of dehydration; not endorsing palpitations or SOB. Cardiac risk factors include tobacco use, HTN, and DM. -EKG, trops X2 -serial vitals   # DM: home regimen 20u lantus qam, A1C in house - lantus 10u while NPO + SSI   # HTN: lisinopril and coreg at home, elevated in the ED while in severe pain. Had just started norvasc as outpatient.  - Lopressor while NPO, restart home meds when able  - would hold ACE while Cr elevated, trend with BMETs  FEN/GI: NPO except ice chips for relief of burning, MIVF @ 125  Prophylaxis: Heparin SQ, PPI as above  Disposition: Admit to med-surg; Dispo pending improvement in pain and intractable emesis.   History of Present Illness: [name] [name] is a 47 y.o. female presenting with abdominal pain, nausea and vomiting. Noted onset of pain on 12/19 in the am after eating a hot dog the night prior to. Woke up with sharp crampy abdominal discomfort followed by several episodes of NBNB emesis. Pain regimen at home includes ativan, phenergan and dilaudid. Presented to the ED on 12/19 where she was given IVF and antiemetics. Labs at that time showed elevated creatinine to 2.14/BUN 38. Pain had resolved and she was stable for d/c. She returned to the ED today for similar presentation and return of emesis with continued abdominal discomfort not relieved by medications. Pointing to mid-epigastrium  stating 10/10 pain, sharp like a knife. Some diarrhea? Patient is unsure. Reports red coloration of vomit.  Denies fever chills rashes or other changes to health. Does report frequent marijuana use. Last use approx 4 days ago. Last  tobacco smoking use 2 days ago. No alcohol use.  In the ED pt given 2L NS bolus. Labs sig for Cr of 2.43. S/p 1mg  ativan, 25mg  phenergan.   Review Of Systems: Per HPI with the following additions: None Otherwise 12 point review of systems was performed and was unremarkable.  Patient Active Problem List   Diagnosis Date Noted  . Anemia 11/15/2013  . Pancreatitis, acute 10/14/2013  . CKD (chronic kidney disease) 08/17/2013  . Viral URI with cough 07/25/2013  . Strep throat 07/13/2013  . Trichomonas 07/13/2013  . Chest pain 06/25/2013  . Hyperkalemia 06/25/2013  . Pruritus 06/20/2013  . Abdominal pain, epigastric 04/19/2013  . Esophagitis 04/18/2013  . Preventative health care 09/18/2012  . High risk social situation 08/31/2012  . Allergic rhinitis, seasonal 05/04/2012  . Marijuana smoker 11/03/2011  . Uncontrolled secondary diabetes mellitus with stage 3 CKD (GFR 30-59) 10/23/2011  . Cyclic vomiting syndrome 08/03/2011  . Hot flashes 07/29/2011  . BACK PAIN, CHRONIC 04/10/2007  . DEPRESSION, MAJOR, RECURRENT 02/23/2007  . TOBACCO DEPENDENCE 02/23/2007  . MIGRAINE, UNSPEC., W/O INTRACTABLE MIGRAINE 02/23/2007  . Essential hypertension, malignant 02/23/2007  . INSOMNIA NOS 02/23/2007  . NEUROPATHY, DIABETIC 02/23/2007  . GERD 01/27/2004   Past Medical History: Past Medical History  Diagnosis Date  . PANCREATITIS 05/09/2008  . Cyclic vomiting syndrome   . Hypertension   . Complication of anesthesia     "problems waking up"  . Heart murmur   . Myocardial infarction 07/2007    "they say I've had a silent one; I don't know"  . Pneumonia   . Anemia   . Blood transfusion   . GERD (gastroesophageal reflux disease)   . Anxiety   . Depression   . Smoker   . Marijuana smoker, continuous   . NEUROPATHY, DIABETIC 02/23/2007  . Type II diabetes mellitus   . Diabetic gastroparesis     /e-chart  . Chronic kidney disease (CKD), stage III (moderate)    Past Surgical  History: Past Surgical History  Procedure Laterality Date  . Port-a-cath placement  ~ 2008    right chest; "poor access; frequent hospitalizations"  . Laparotomy and lysis of adhesions    . Abdominal hysterectomy  02/2002  . Cholecystectomy  1980's  . Cardiac catheterization    . Dilation and evacuation  X3  . Left hand surgery    . Cataract extraction w/ intraocular lens  implant, bilateral    . Colonoscopy    . Upper gi endoscopy     Social History: History  Substance Use Topics  . Smoking status: Current Some Day Smoker -- 0.50 packs/day for 22 years    Types: Cigarettes  . Smokeless tobacco: Never Used  . Alcohol Use: No   Additional social history: lives at home by herself, Reports she is trying to quit smoking (tobacco and marijuana) and has goal for herself of Jan 1. Please also refer to relevant sections of EMR.  Family History: Family History  Problem Relation Age of Onset  . Diabetes type II Mother   . Diabetes Mother   . Hypertension Mother   . Diabetes type II Sister   . Diabetes Sister   . Hypertension Sister   . Hypertension Brother    Allergies  and Medications: Allergies  Allergen Reactions  . Bee Venom Anaphylaxis  . Acetaminophen Nausea And Vomiting  . Novolog [Insulin Aspart] Other (See Comments)    Muscles in feet cramp  . Erythromycin Nausea And Vomiting   Current Facility-Administered Medications on File Prior to Encounter  Medication Dose Route Frequency Provider Last Rate Last Dose  . 0.9 %  sodium chloride infusion   Intravenous Continuous Andrena Mews, MD 20 mL/hr at 04/19/13 1500 500 mL at 04/19/13 1500   Current Outpatient Prescriptions on File Prior to Encounter  Medication Sig Dispense Refill  . amLODipine (NORVASC) 5 MG tablet Take 1 tablet (5 mg total) by mouth daily.  90 tablet  0  . aspirin EC 81 MG tablet Take 81 mg by mouth every morning.      Marland Kitchen BOOSTRIX 5-2.5-18.5 injection Inject 0.5 mLs into the muscle once.      .  carvedilol (COREG) 25 MG tablet Take 1 tablet (25 mg total) by mouth 2 (two) times daily with a meal.  60 tablet  5  . diclofenac sodium (VOLTAREN) 1 % GEL Apply 2 g topically 4 (four) times daily as needed (for pain).       . insulin glargine (LANTUS) 100 UNIT/ML injection Inject 0.2 mLs (20 Units total) into the skin every morning.  10 mL  5  . lisinopril (PRINIVIL,ZESTRIL) 40 MG tablet Take 1 tablet (40 mg total) by mouth every morning.  30 tablet  5  . [START ON 12/28/2013] LORazepam (ATIVAN) 1 MG tablet Take 1 tablet (1 mg total) by mouth 2 (two) times daily as needed for anxiety. Fill on or after 12/28/2013.  60 tablet  0  . metoCLOPramide (REGLAN) 10 MG tablet Take 1 tablet (10 mg total) by mouth 4 (four) times daily.  120 tablet  0  . ondansetron (ZOFRAN) 8 MG tablet Take 1 tablet (8 mg total) by mouth every 8 (eight) hours as needed.  60 tablet  0  . oxyCODONE (OXY IR/ROXICODONE) 5 MG immediate release tablet Take 1 tablet (5 mg total) by mouth every 8 (eight) hours as needed.  30 tablet  0  . pantoprazole (PROTONIX) 40 MG tablet Take 1 tablet (40 mg total) by mouth daily at 12 noon.  30 tablet  0  . polyethylene glycol (MIRALAX / GLYCOLAX) packet Take 1 cap twice a day, adjust as needed to produce 1-2 soft bowel movements per day.  100 each  0  . promethazine (PHENERGAN) 25 MG suppository Place 1 suppository (25 mg total) rectally every 6 (six) hours as needed for nausea (Take only when unable to keep oral phenergan down).  12 each  0  . promethazine (PHENERGAN) 25 MG tablet Take 1 tablet (25 mg total) by mouth every 6 (six) hours.  120 tablet  1  . SUMAtriptan (IMITREX) 50 MG tablet Take 50 mg by mouth every 2 (two) hours as needed for migraine. May repeat in 2 hours if headache persists or recurs.        Objective: BP 145/74  Pulse 106  Temp(Src) 98.9 F (37.2 C) (Oral)  Resp 14  SpO2 100% Exam: General: NAD, lying on side, rolling around in bed, in apparent discomfort, occasionally  vomiting clear mucus and saliva HEENT: NCAT, dry MM, PERRL, EOMI, sclera clear Cardiovascular: tachycardic, reg rhythm, no m/r/g Respiratory: CTAB, no wheezing Abdomen: soft, obese, pinpoint tender in epigastrium (but pointing all over abdomen as site of pain), no rebound, voluntary guarding present, no peritoneal signs, hyperactive  bowel sounds Extremities: WWP, calluses on bottom of plantar surface of left foot near big toe and right foot near 5th digit, no LE edema or calf tenderness Skin: no rashes or lesions Neuro: A&O X3, CN II-XII tested and are intact, sensation in tact bilaterally to light touch in upper and lower extremities   Labs and Imaging: CBC BMET   Recent Labs Lab 12/14/13 1058  WBC 6.7  HGB 11.5*  HCT 32.5*  PLT 188    Recent Labs Lab 12/15/13 1710  NA 136  K 3.4*  CL 88*  CO2 29  BUN 40*  CREATININE 2.43*  GLUCOSE 260*  CALCIUM 9.9      Langston Masker, MD 12/15/2013, 7:20 PM PGY-1, [place] Intern pager: [phone], text pages welcome   I have seen and examined patient and agree with Dr Burt Ek assessment and plan with my additions in purple.  Hilton Sinclair, MD 12/15/2013 10:17 PM PGY-2, [place]

## 2013-12-16 DIAGNOSIS — N179 Acute kidney failure, unspecified: Secondary | ICD-10-CM | POA: Diagnosis not present

## 2013-12-16 DIAGNOSIS — R1115 Cyclical vomiting syndrome unrelated to migraine: Secondary | ICD-10-CM

## 2013-12-16 LAB — CBC
HCT: 33.5 % — ABNORMAL LOW (ref 36.0–46.0)
Hemoglobin: 11.8 g/dL — ABNORMAL LOW (ref 12.0–15.0)
MCV: 89.6 fL (ref 78.0–100.0)
RBC: 3.74 MIL/uL — ABNORMAL LOW (ref 3.87–5.11)
WBC: 11.6 10*3/uL — ABNORMAL HIGH (ref 4.0–10.5)

## 2013-12-16 LAB — RAPID URINE DRUG SCREEN, HOSP PERFORMED
Amphetamines: NOT DETECTED
Barbiturates: NOT DETECTED
Benzodiazepines: NOT DETECTED
Cocaine: NOT DETECTED
Opiates: POSITIVE — AB

## 2013-12-16 LAB — COMPREHENSIVE METABOLIC PANEL
AST: 19 U/L (ref 0–37)
BUN: 34 mg/dL — ABNORMAL HIGH (ref 6–23)
CO2: 28 mEq/L (ref 19–32)
Calcium: 8.6 mg/dL (ref 8.4–10.5)
Creatinine, Ser: 1.97 mg/dL — ABNORMAL HIGH (ref 0.50–1.10)
GFR calc non Af Amer: 29 mL/min — ABNORMAL LOW (ref >90)
Potassium: 3.3 mEq/L — ABNORMAL LOW (ref 3.5–5.1)

## 2013-12-16 LAB — GLUCOSE, CAPILLARY: Glucose-Capillary: 81 mg/dL (ref 70–99)

## 2013-12-16 LAB — HEMOGLOBIN A1C: Mean Plasma Glucose: 160 mg/dL — ABNORMAL HIGH (ref <117)

## 2013-12-16 LAB — TROPONIN I: Troponin I: <0.3 ng/mL (ref <0.30)

## 2013-12-16 MED ORDER — HYDROMORPHONE HCL PF 1 MG/ML IJ SOLN
1.0000 mg | INTRAMUSCULAR | Status: DC | PRN
  Administered 2013-12-16 – 2013-12-17 (×6): 1 mg via INTRAVENOUS
  Filled 2013-12-16 (×6): qty 1

## 2013-12-16 MED ORDER — GI COCKTAIL ~~LOC~~
30.0000 mL | Freq: Once | ORAL | Status: AC
  Administered 2013-12-16: 30 mL via ORAL
  Filled 2013-12-16: qty 30

## 2013-12-16 MED ORDER — POTASSIUM CHLORIDE 10 MEQ/100ML IV SOLN
10.0000 meq | INTRAVENOUS | Status: AC
  Administered 2013-12-16 (×3): 10 meq via INTRAVENOUS
  Filled 2013-12-16 (×3): qty 100

## 2013-12-16 MED ORDER — WHITE PETROLATUM GEL
Status: AC
  Administered 2013-12-16: 0.2
  Filled 2013-12-16: qty 5

## 2013-12-16 MED ORDER — LORAZEPAM 2 MG/ML IJ SOLN
1.0000 mg | Freq: Two times a day (BID) | INTRAMUSCULAR | Status: DC | PRN
  Administered 2013-12-16 – 2013-12-17 (×4): 1 mg via INTRAVENOUS
  Filled 2013-12-16 (×3): qty 1

## 2013-12-16 MED ORDER — LORAZEPAM 2 MG/ML IJ SOLN
INTRAMUSCULAR | Status: AC
  Filled 2013-12-16: qty 1

## 2013-12-16 MED ORDER — HYDRALAZINE HCL 20 MG/ML IJ SOLN
10.0000 mg | INTRAMUSCULAR | Status: DC | PRN
  Administered 2013-12-16 (×2): 10 mg via INTRAVENOUS
  Filled 2013-12-16 (×2): qty 1

## 2013-12-16 MED ORDER — METOPROLOL TARTRATE 1 MG/ML IV SOLN: 15.0000 mg | Freq: Two times a day (BID) | INTRAVENOUS | Status: DC

## 2013-12-16 MED ORDER — METOPROLOL TARTRATE 1 MG/ML IV SOLN: 10.0000 mg | Freq: Four times a day (QID) | INTRAVENOUS | Status: DC

## 2013-12-16 MED ORDER — METOPROLOL TARTRATE 1 MG/ML IV SOLN
10.0000 mg | Freq: Four times a day (QID) | INTRAVENOUS | Status: DC
  Administered 2013-12-16 – 2013-12-17 (×4): 10 mg via INTRAVENOUS
  Filled 2013-12-16 (×6): qty 10

## 2013-12-16 MED ORDER — DIPHENHYDRAMINE HCL 25 MG PO CAPS
25.0000 mg | ORAL_CAPSULE | Freq: Four times a day (QID) | ORAL | Status: DC | PRN
  Administered 2013-12-16: 25 mg via ORAL
  Filled 2013-12-16: qty 1

## 2013-12-16 NOTE — Assessment & Plan Note (Signed)
Cr 1.8, baseline arppears 1.5-2. No chronic nsaid use other than voltaren gel and lisinopril 40mg ; uses asa 81mg  daily. Possible contribution of DM and HTN with intermittent prerenal injury from dehydration from GI losses (cyclic vomiting syndrome). - Referral placed to renal - better BP control with norvasc - Stay hydrated - anemia may be partly of chronic dz; needs further w/u

## 2013-12-16 NOTE — Assessment & Plan Note (Addendum)
No red flag symptoms. Back pain is a chronic issue for patient. Oxycodone per prior notes helps pt be more active. - Refilled oxycodone today with 30 tablets of 5mg . Previously was getting 60 tablets of 5mg . - Use voltaren gel first and oxycodone for intermittent worsening of chronic back pain but mostly for cyclic vomiting episodes to help keep her out of hospital.

## 2013-12-16 NOTE — Progress Notes (Addendum)
Patient voided but sample not collected due to NA flushing urine. Multiple pages sent to IV team for lab draw from [facility]. Received call back at 2:50 am. Labs to be drawn per IV RN. Patient BP high, and tachycardic order received for PRN hydralazine. No change in BP noted. Will continue to monitor patient.  Denver Faster

## 2013-12-16 NOTE — H&P (Signed)
FMTS Attending Admit Note Patient seen and examined by me, discussed with resident team and I agree with Dr Landry Corporal admission plan.  Patient known to our service, presents with recurrence of her abd pain and N/V for the past 3 days.  Attributes to eating a hot dog at a hot dog stand.  No fevers or chills.  She notes this morning that Morphine makes her nausea worse, also prefers her meds to be given at the same time.  This morning she reports some nausea and 'dry heaves' without substantial emesis.  Plan for IVF, pain control and antiemetics.  To avoid morphine; okay to switch to Dilaudid for pain on PRN basis in acute setting.  Dalbert Mayotte, MD

## 2013-12-16 NOTE — Progress Notes (Signed)
Family Medicine Teaching Service Daily Progress Note Intern Pager: [phone]  Patient name: [name] [name] Medical record number: [id number] Date of birth: [birth year] Age: 47 y.o. Gender: female  Primary Care Provider: Conni Slipper, MD Consultants: None Code Status: Full  Pt Overview and Major Events to Date:  12/20: Admitted for cyclic vomiting syndrome  Assessment and Plan: [name] [name] is a 47 y.o. female presenting with n/v/abdominal pain X2 days. PMH is significant for pancreatitis, diabetes, cyclic vomiting syndrome.   # Cyclic vomiting, abdominal pain:  Acute onset after ingestion of marijuana (pos UDS) and hotdog. No signs acute abdomen. Hgb stable. Lipase 44. Normal LFT and Tbili. S/p cholecystectomy. - s/p GI cocktail  - zofran and scheduled protonix, prn ativan and prn phenergan  - monitor CBC for hgb drop given "red" vomit, monitor for hematochezia/melena  - gastro-occult/fecal occult  - NPO with ice chips, advance when pain controlled w/o IV meds  - consider abdominal u/s to eval for new GI pathology given severe pain/tenderness. Think unlikely to be perf at this time.  -consider GI consult if unimproved and concern for PUD increases  - heating pad prn  - Re-emphasized possible correlation of symptoms to marijuana use and potential great benefit if she quits of ending this cycle.  - Changed pain regimen to dilaudid 1mg  q2 hours prn.  # AKI on stage 3 CKD: has both diabetic nephropathy and gastroparesis per hx, has not yet seen renal; baseline CR 1.6-1.8. Likely prerenal uptrending, now down-trending to 1.9. -rehydration MIVF @125   -follow with BMETs  -Renal referral ordered as outpatient.  # tachycardia and epigastric discomfort: likely related to abdominal pain with some component of dehydration; not endorsing palpitations or SOB. Cardiac risk factors include tobacco use, HTN, and DM.  -EKG, trops X2 (neg x 1) -serial vitals   # DM: home regimen 20u  lantus qam - A1c pending  - lantus 10u while NPO + SSI   # HTN: lisinopril and coreg at home, elevated in the ED while in severe pain. Had just started norvasc as outpatient.  - Lopressor while NPO, restart home meds when able  - Added hydralazine PRN SBP>180 and DBP>110. - would hold ACE while Cr elevated, trend with BMETs  - Increased lopressor to 10mg  q6hours. Monitor.  # Leukocytosis - mild, but doubled from yesterday, possibly acute phase reactant from cyclic vomiting. - Recheck in AM  FEN/GI: NPO except ice chips for relief of burning, MIVF @ 125  - Mild hypokalemia - likely from emesis. Repleting with 95mEq IV q 1 hour x 3 Prophylaxis: Heparin SQ, PPI as above    Disposition: Dispo pending improvement in pain and intractable emesis.  Subjective: Reports back pain and requesting dilaudid as morphine makes her feel sicker. Reports continued stable symptoms.  Objective: Temp:  [98.5 F (36.9 C)-98.9 F (37.2 C)] 98.5 F (36.9 C) (12/21 0514) Pulse Rate:  [95-129] 104 (12/21 0514) Resp:  [14-16] 16 (12/21 0514) BP: (113-202)/(63-105) 172/94 mmHg (12/21 0514) SpO2:  [97 %-100 %] 100 % (12/21 0514) Weight:  [143 lb 8 oz (65.091 kg)] 143 lb 8 oz (65.091 kg) (12/20 2032) Physical Exam: General: NAD, seated at side of bed Cardiovascular: RRR by pulse Respiratory: Normal effort Abdomen: Soft, generalized tenderness, nondistended, decreased bowel sounds  Laboratory:  Recent Labs Lab 12/14/13 1058 12/16/13 0500  WBC 6.7 11.6*  HGB 11.5* 11.8*  HCT 32.5* 33.5*  PLT 188 193    Recent Labs Lab 12/14/13 1058 12/15/13 1710 12/16/13  0500  NA 140 136 143  K 4.2 3.4* 3.3*  CL 105 88* 102  CO2 24 29 28   BUN 38* 40* 34*  CREATININE 2.14* 2.43* 1.97*  CALCIUM 9.6 9.9 8.6  PROT 7.5  --  7.5  BILITOT 0.2*  --  0.5  ALKPHOS 73  --  77  ALT 13  --  12  AST 16  --  19  GLUCOSE 96 260* 159*    Hilton Sinclair, MD 12/16/2013, 10:03 AM PGY-2, [place] Intern pager: [phone], text pages welcome

## 2013-12-16 NOTE — Assessment & Plan Note (Signed)
Poorly controlled, likely contributing to renal function. - Norvasc 5mg  daily started - Continue coreg and lisinopril - F/u in 2 mo

## 2013-12-16 NOTE — Assessment & Plan Note (Signed)
-   Refilled oxycodone 30 tablets of 5mg  and ativan 1mg  60 tablets to be filled on 12/28/13 (as she should have enough at this time to last until mid-January but this gives some overlap). - These help keep her out of the hospital when she has cyclic vomiting episodes.

## 2013-12-17 DIAGNOSIS — K219 Gastro-esophageal reflux disease without esophagitis: Secondary | ICD-10-CM

## 2013-12-17 DIAGNOSIS — F1210 Cannabis abuse, uncomplicated: Secondary | ICD-10-CM

## 2013-12-17 DIAGNOSIS — I10 Essential (primary) hypertension: Secondary | ICD-10-CM

## 2013-12-17 DIAGNOSIS — E43 Unspecified severe protein-calorie malnutrition: Secondary | ICD-10-CM | POA: Insufficient documentation

## 2013-12-17 DIAGNOSIS — R1115 Cyclical vomiting syndrome unrelated to migraine: Secondary | ICD-10-CM | POA: Diagnosis not present

## 2013-12-17 DIAGNOSIS — N179 Acute kidney failure, unspecified: Secondary | ICD-10-CM | POA: Diagnosis not present

## 2013-12-17 HISTORY — DX: Unspecified severe protein-calorie malnutrition: E43

## 2013-12-17 LAB — GLUCOSE, CAPILLARY
Glucose-Capillary: 288 mg/dL — ABNORMAL HIGH (ref 70–99)
Glucose-Capillary: 78 mg/dL (ref 70–99)
Glucose-Capillary: 152 mg/dL — ABNORMAL HIGH (ref 70–99)
Glucose-Capillary: 194 mg/dL — ABNORMAL HIGH (ref 70–99)

## 2013-12-17 LAB — BASIC METABOLIC PANEL
CO2: 26 mEq/L (ref 19–32)
Calcium: 8.4 mg/dL (ref 8.4–10.5)
Chloride: 104 mEq/L (ref 96–112)
GFR calc Af Amer: 33 mL/min — ABNORMAL LOW (ref >90)
Glucose, Bld: 78 mg/dL (ref 70–99)
Potassium: 3.4 mEq/L — ABNORMAL LOW (ref 3.5–5.1)
Sodium: 137 mEq/L (ref 135–145)

## 2013-12-17 LAB — CBC
HCT: 28.6 % — ABNORMAL LOW (ref 36.0–46.0)
Hemoglobin: 9.8 g/dL — ABNORMAL LOW (ref 12.0–15.0)
MCH: 31.5 pg (ref 26.0–34.0)
MCV: 92 fL (ref 78.0–100.0)
Platelets: 164 10*3/uL (ref 150–400)
RBC: 3.11 MIL/uL — ABNORMAL LOW (ref 3.87–5.11)
WBC: 7.1 10*3/uL (ref 4.0–10.5)

## 2013-12-17 MED ORDER — CARVEDILOL 25 MG PO TABS
25.0000 mg | ORAL_TABLET | Freq: Two times a day (BID) | ORAL | Status: DC
  Administered 2013-12-17 – 2013-12-19 (×4): 25 mg via ORAL
  Filled 2013-12-17 (×7): qty 1

## 2013-12-17 MED ORDER — ACETAMINOPHEN 325 MG PO TABS: 650.0000 mg | ORAL_TABLET | Freq: Four times a day (QID) | ORAL | Status: DC | PRN

## 2013-12-17 MED ORDER — AMLODIPINE BESYLATE 5 MG PO TABS
5.0000 mg | ORAL_TABLET | Freq: Every day | ORAL | Status: DC
  Administered 2013-12-17 – 2013-12-18 (×2): 5 mg via ORAL
  Filled 2013-12-17 (×3): qty 1

## 2013-12-17 MED ORDER — BOOST / RESOURCE BREEZE PO LIQD
1.0000 | Freq: Three times a day (TID) | ORAL | Status: DC
  Administered 2013-12-17 – 2013-12-18 (×6): 1 via ORAL

## 2013-12-17 MED ORDER — HYDROMORPHONE HCL PF 1 MG/ML IJ SOLN
1.0000 mg | Freq: Four times a day (QID) | INTRAMUSCULAR | Status: DC | PRN
  Administered 2013-12-17 – 2013-12-18 (×2): 1 mg via INTRAVENOUS
  Filled 2013-12-17 (×2): qty 1

## 2013-12-17 NOTE — Progress Notes (Signed)
RN gave the 1200 dose of metoprolol that would have been given before the order was discontinued.

## 2013-12-17 NOTE — Progress Notes (Signed)
On call MD paged about patients increased temp, and increased feeling of malaise.   MD called back and ordered tylenol prn and a flu PCR.  Patient is allergic to tylenol, so RN will not give this medication. PCR was collected and sent to lab.   RN messaged on call MD through [facility] requesting that he discontinue the tyelnol. Patient also given an incentive spirometer to encourage deep breathing. Will check temperature in 1 hour, or at 1900.

## 2013-12-17 NOTE — Progress Notes (Signed)
Family Medicine Teaching Service Daily Progress Note Intern Pager: [phone]  Patient name: [name] [name] Medical record number: [id number] Date of birth: 01/02/1966 Age: 47 y.o. Gender: female  Primary Care Provider: Conni Slipper, MD Consultants: None Code Status: Full  Pt Overview and Major Events to Date:  12/20: Admitted for cyclic vomiting syndrome  Assessment and Plan: [name] [name] is a 47 y.o. female presenting with n/v/abdominal pain X2 days. PMH is significant for pancreatitis, diabetes, cyclic vomiting syndrome.   # Cyclic vomiting, abdominal pain: Acute onset after ingestion of marijuana (pos UDS) and hotdog.No signs acute abdomen.Hgb stable. Lipase 44. Normal LFT and Tbili. S/p cholecystectomy. Very well appearing this morning - s/p GI cocktail  - zofran and scheduled protonix, prn ativan and prn phenergan  - monitor CBC for hgb drop given "red" vomit, monitor for hematochezia/melena  - gastro-occult/fecal occult  - consider abdominal u/s to eval for new GI pathology given severe pain/tenderness. Think unlikely to be perf at this time.  -consider GI consult if unimproved and concern for PUD increases  - heating pad prn  - Re-emphasized possible correlation of symptoms to marijuana use and potential great benefit if she quits of ending this cycle.  - Changed pain regimen to dilaudid 1mg  q2 hours prn yesterday (requiring 106 morphine equiv). Will plan to transition to orals when tolerating solid food, pt eager to go home before xmas  # AKI on stage 3 CKD: has both diabetic nephropathy and gastroparesis per hx, has not yet seen renal; baseline CR 1.6-1.8. Likely prerenal uptrending, now down-trending to 1.9. -rehydration MIVF @125   -follow with BMETs  -Renal referral ordered as outpatient.  # tachycardia and epigastric discomfort: likely related to abdominal pain with some component of dehydration; not endorsing palpitations or SOB. Cardiac risk factors include  tobacco use, HTN, and DM.  -EKG, trops X2 (neg x 1) -serial vitals   # DM: home regimen 20u lantus qam; A1c 7.2 - lantus 10u while NPO + SSI   # HTN: lisinopril and coreg at home, elevated in the ED while in severe pain. Had just started norvasc as outpatient.  - Lopressor while NPO, restart home meds when able, possible today - Added hydralazine PRN SBP>180 and DBP>110. - would hold ACE while Cr elevated, trend with BMETs  - Increased lopressor to 10mg  q6hours yesterday, BP improved. Monitor.  # Leukocytosis - mild, but doubled yesterday, possibly acute phase reactant from cyclic vomiting. Back to 7.1 this morning - trend with CBCs  FEN/GI: NPO except ice chips for relief of burning, MIVF @ 125  - Mild hypokalemia - likely from emesis. Repleting with 23mEq IV q 1 hour x 3 Prophylaxis: Heparin SQ, PPI as above    Disposition: Dispo pending improvement in pain and intractable emesis.  Subjective: Tolerated liquids well yesterday and this morning, would like to consider advancing diet today  Objective: Temp:  [98.1 F (36.7 C)-99 F (37.2 C)] 98.6 F (37 C) (12/22 [id number]) Pulse Rate:  [82-108] 82 (12/22 0613) Resp:  [18-19] 19 (12/22 0613) BP: (152-196)/(74-105) 162/83 mmHg (12/22 0613) SpO2:  [96 %-100 %] 100 % (12/22 [id number]) Physical Exam: General: NAD, walking around room Cardiovascular: RRR by pulse Respiratory: Normal effort Abdomen: Soft, generalized tenderness worse in the epigastric region (voluntary guarding), nondistended, decreased bowel sounds Ext: no erythema or edema  Laboratory:  Recent Labs Lab 12/14/13 1058 12/16/13 0500  WBC 6.7 11.6*  HGB 11.5* 11.8*  HCT 32.5* 33.5*  PLT 188 193  Recent Labs Lab 12/14/13 1058 12/15/13 1710 12/16/13 0500  NA 140 136 143  K 4.2 3.4* 3.3*  CL 105 88* 102  CO2 24 29 28   BUN 38* 40* 34*  CREATININE 2.14* 2.43* 1.97*  CALCIUM 9.6 9.9 8.6  PROT 7.5  --  7.5  BILITOT 0.2*  --  0.5  ALKPHOS 73  --  77  ALT 13   --  12  AST 16  --  19  GLUCOSE 96 260* 159*    Langston Masker, MD 12/17/2013, 6:42 AM PGY-1, [place] Intern pager: [phone], text pages welcome

## 2013-12-17 NOTE — Progress Notes (Signed)
INITIAL NUTRITION ASSESSMENT  DOCUMENTATION CODES Per approved criteria  -Severe malnutrition in the context of chronic illness   INTERVENTION:  1. Resource Breeze po TID, each supplement provides 250 kcal and 9 grams of protein  NUTRITION DIAGNOSIS: Malnutriton related to chronic N/V as evidenced by 11% weight loss x 6 months and intake of </= 75% of her needs for >/= 1 month.   Goal: Pt to meet >/= 90% of their estimated nutrition needs   Monitor:  Diet advancement, PO intake, weight trend, labs  Reason for Assessment: Pt identified as at nutrition risk on the Malnutrition Screen Tool  47 y.o. female  Admitting Dx: <principal problem not specified>  ASSESSMENT: Pt admitted with cyclic vomiting. Per pt this has been a problem for her for 11 years after her hysterectomy surgery.  Pt states that she has one of these vomiting episodes about once a month that requires a hospitalization. These episodes tend to last 3-5 days but sometimes up to 10 days. During that time pt does not eat. During her well times pt averages about 2 meals per day but sometimes only eats one meal per day depending on her appetite. Pt likes Resource Breeze but reports that she could not find it in the store at home.  We discussed outpatient options for [employer].  Pt's weight has continued to trend down over the last 6 months. Per chart review pt with 11% weight loss x 6 months. Pt confirms that she has lost a lot of weight.   Nutrition Focused Physical Exam:  Subcutaneous Fat:  Orbital Region: WNL Upper Arm Region: WNl Thoracic and Lumbar Region: WNL  Muscle:  Temple Region: WNl Clavicle Bone Region: WNl Clavicle and Acromion Bone Region: WNL Scapular Bone Region: WNL Dorsal Hand: WNL Patellar Region: WNL Anterior Thigh Region: WNL Posterior Calf Region: WNL  Edema: not present   Height: Ht Readings from Last 1 Encounters:  12/15/13 5\' 1"  (1.549 m)    Weight: Wt Readings  from Last 1 Encounters:  12/15/13 143 lb 8 oz (65.091 kg)    Ideal Body Weight: 47.7 kg  % Ideal Body Weight: 136%  Wt Readings from Last 10 Encounters:  12/15/13 143 lb 8 oz (65.091 kg)  12/13/13 149 lb (67.586 kg)  11/15/13 150 lb (68.04 kg)  10/16/13 154 lb 5.2 oz (70 kg)  10/13/13 156 lb (70.761 kg)  09/27/13 156 lb (70.761 kg)  08/31/13 159 lb (72.122 kg)  08/20/13 160 lb 9.6 oz (72.848 kg)  07/25/13 154 lb (69.854 kg)  07/13/13 151 lb (68.493 kg)    Usual Body Weight: 160 lb 6/14  % Usual Body Weight: 89%  BMI:  Body mass index is 27.13 kg/(m^2).  Estimated Nutritional Needs: Kcal: 1600-1800 Protein: 75-85 grams Fluid: >1.6 L/day  Skin: no issues noted  Diet Order: Clear Liquid Meal Completion: 100%  EDUCATION NEEDS: -No education needs identified at this time   Intake/Output Summary (Last 24 hours) at 12/17/13 0926 Last data filed at 12/17/13 0730  Gross per 24 hour  Intake   2400 ml  Output    300 ml  Net   2100 ml    Last BM: PTA   Labs:   Recent Labs Lab 12/15/13 1710 12/16/13 0500 12/17/13 0500  NA 136 143 137  K 3.4* 3.3* 3.4*  CL 88* 102 104  CO2 29 28 26   BUN 40* 34* 23  CREATININE 2.43* 1.97* 2.01*  CALCIUM 9.9 8.6 8.4  GLUCOSE 260* 159*  78    CBG (last 3)   Recent Labs  12/16/13 2022 12/16/13 2348 12/17/13 0611  GLUCAP 86 288* 78    Scheduled Meds: . heparin  5,000 Units Subcutaneous Q8H  . insulin aspart  0-15 Units Subcutaneous TID WC  . insulin aspart  0-5 Units Subcutaneous QHS  . insulin glargine  10 Units Subcutaneous QHS  . metoprolol  10 mg Intravenous Q6H  . pantoprazole (PROTONIX) IV  40 mg Intravenous Q12H  . senna  1 tablet Oral BID    Continuous Infusions: . sodium chloride 125 mL/hr at 12/16/13 1548    Past Medical History  Diagnosis Date  . PANCREATITIS 05/09/2008  . Cyclic vomiting syndrome   . Hypertension   . Complication of anesthesia     "problems waking up"  . Heart murmur   .  Myocardial infarction 07/2007    "they say I've had a silent one; I don't know"  . Pneumonia   . Anemia   . Blood transfusion   . GERD (gastroesophageal reflux disease)   . Anxiety   . Depression   . Smoker   . Marijuana smoker, continuous   . NEUROPATHY, DIABETIC 02/23/2007  . Type II diabetes mellitus   . Diabetic gastroparesis     /e-chart  . Chronic kidney disease (CKD), stage III (moderate)     Past Surgical History  Procedure Laterality Date  . Port-a-cath placement  ~ 2008    right chest; "poor access; frequent hospitalizations"  . Laparotomy and lysis of adhesions    . Abdominal hysterectomy  02/2002  . Cholecystectomy  1980's  . Cardiac catheterization    . Dilation and evacuation  X3  . Left hand surgery    . Cataract extraction w/ intraocular lens  implant, bilateral    . Colonoscopy    . Upper gi endoscopy      [place], [place], [place] Pager [phone] After Hours Pager

## 2013-12-17 NOTE — Progress Notes (Signed)
Utilization review completed.  

## 2013-12-17 NOTE — Progress Notes (Signed)
FMTS Attending Note  I personally saw and evaluated the patient. The plan of care was discussed with the resident team. I agree with the assessment and plan as documented by the resident.   Abdominal pain improved today, decreased nausea and vomiting, patient asking for advancement of diet -Advance diet to full liquids -continue management as outlined in resident note.  Dossie Arbour MD

## 2013-12-18 ENCOUNTER — Inpatient Hospital Stay (HOSPITAL_COMMUNITY): Payer: Medicare Other

## 2013-12-18 DIAGNOSIS — R509 Fever, unspecified: Secondary | ICD-10-CM | POA: Diagnosis not present

## 2013-12-18 DIAGNOSIS — R1115 Cyclical vomiting syndrome unrelated to migraine: Secondary | ICD-10-CM | POA: Diagnosis not present

## 2013-12-18 DIAGNOSIS — R1013 Epigastric pain: Secondary | ICD-10-CM | POA: Diagnosis not present

## 2013-12-18 DIAGNOSIS — N189 Chronic kidney disease, unspecified: Secondary | ICD-10-CM | POA: Diagnosis not present

## 2013-12-18 DIAGNOSIS — R05 Cough: Secondary | ICD-10-CM | POA: Diagnosis not present

## 2013-12-18 DIAGNOSIS — Z7289 Other problems related to lifestyle: Secondary | ICD-10-CM

## 2013-12-18 DIAGNOSIS — K219 Gastro-esophageal reflux disease without esophagitis: Secondary | ICD-10-CM | POA: Diagnosis not present

## 2013-12-18 LAB — CBC
MCH: 31.9 pg (ref 26.0–34.0)
MCV: 91.1 fL (ref 78.0–100.0)
Platelets: 134 10*3/uL — ABNORMAL LOW (ref 150–400)
RDW: 13 % (ref 11.5–15.5)

## 2013-12-18 LAB — BASIC METABOLIC PANEL
CO2: 25 mEq/L (ref 19–32)
Calcium: 7.9 mg/dL — ABNORMAL LOW (ref 8.4–10.5)
Creatinine, Ser: 1.68 mg/dL — ABNORMAL HIGH (ref 0.50–1.10)
GFR calc Af Amer: 41 mL/min — ABNORMAL LOW (ref >90)
Glucose, Bld: 83 mg/dL (ref 70–99)
Potassium: 3.3 mEq/L — ABNORMAL LOW (ref 3.5–5.1)

## 2013-12-18 LAB — GLUCOSE, CAPILLARY: Glucose-Capillary: 270 mg/dL — ABNORMAL HIGH (ref 70–99)

## 2013-12-18 MED ORDER — GUAIFENESIN ER 600 MG PO TB12: 600.0000 mg | ORAL_TABLET | Freq: Two times a day (BID) | ORAL | Status: DC | PRN

## 2013-12-18 MED ORDER — IBUPROFEN 400 MG PO TABS
400.0000 mg | ORAL_TABLET | Freq: Three times a day (TID) | ORAL | Status: DC | PRN
  Filled 2013-12-18 (×2): qty 1

## 2013-12-18 MED ORDER — PANTOPRAZOLE SODIUM 40 MG PO TBEC
40.0000 mg | DELAYED_RELEASE_TABLET | Freq: Two times a day (BID) | ORAL | Status: DC
  Administered 2013-12-18: 40 mg via ORAL
  Filled 2013-12-18: qty 1

## 2013-12-18 MED ORDER — AMLODIPINE BESYLATE 10 MG PO TABS
10.0000 mg | ORAL_TABLET | Freq: Every day | ORAL | Status: DC
  Filled 2013-12-18: qty 1

## 2013-12-18 MED ORDER — GUAIFENESIN ER 600 MG PO TB12
600.0000 mg | ORAL_TABLET | Freq: Two times a day (BID) | ORAL | Status: DC | PRN
  Administered 2013-12-18: 600 mg via ORAL
  Filled 2013-12-18: qty 1

## 2013-12-18 MED ORDER — AMLODIPINE BESYLATE 10 MG PO TABS: 10.0000 mg | ORAL_TABLET | Freq: Every day | ORAL | Status: DC

## 2013-12-18 MED ORDER — BENZONATATE 100 MG PO CAPS: 100.0000 mg | ORAL_CAPSULE | Freq: Two times a day (BID) | ORAL | Status: DC | PRN

## 2013-12-18 MED ORDER — BENZONATATE 100 MG PO CAPS
100.0000 mg | ORAL_CAPSULE | Freq: Two times a day (BID) | ORAL | Status: DC | PRN
  Administered 2013-12-18: 100 mg via ORAL
  Filled 2013-12-18: qty 1

## 2013-12-18 MED ORDER — HEPARIN SOD (PORK) LOCK FLUSH 100 UNIT/ML IV SOLN
500.0000 [IU] | INTRAVENOUS | Status: AC | PRN
  Administered 2013-12-18: 500 [IU]

## 2013-12-18 MED ORDER — AMLODIPINE BESYLATE 5 MG PO TABS
5.0000 mg | ORAL_TABLET | Freq: Once | ORAL | Status: AC
  Administered 2013-12-18: 5 mg via ORAL
  Filled 2013-12-18: qty 1

## 2013-12-18 MED ORDER — OXYCODONE HCL 5 MG PO TABS
10.0000 mg | ORAL_TABLET | ORAL | Status: DC | PRN
  Administered 2013-12-18 – 2013-12-19 (×4): 10 mg via ORAL
  Filled 2013-12-18 (×4): qty 2

## 2013-12-18 NOTE — Progress Notes (Signed)
FMTS Attending Daily Note: Khoen Genet MD [phone] pager office [phone] I  have seen and examined this patient, reviewed their chart. I have discussed this patient with the resident. I agree with the resident's findings, assessment and care plan. 

## 2013-12-18 NOTE — Progress Notes (Signed)
CBG 160 before lunch. NT dropped machine so it may be malfunctioning at this time, but her blood glucose was checked.

## 2013-12-18 NOTE — Progress Notes (Signed)
Family Medicine Teaching Service Daily Progress Note Intern Pager: [phone]  Patient name: [name] [name] Medical record number: [id number] Date of birth: [birth year] Age: 47 y.o. Gender: female  Primary Care Provider: Conni Slipper, MD Consultants: None Code Status: Full  Pt Overview and Major Events to Date:  12/20: Admitted for cyclic vomiting syndrome  Assessment and Plan: [name] [name] is a 47 y.o. female presenting with n/v/abdominal pain X2 days. PMH is significant for pancreatitis, diabetes, cyclic vomiting syndrome.   # Cyclic vomiting, abdominal pain: Acute onset after ingestion of marijuana (pos UDS) and hotdog. Very well appearing this morning - zofran and scheduled protonix, prn ativan and prn phenergan  - Re-emphasized possible correlation of symptoms to marijuana use and potential great benefit if she quits of ending this cycle.  - oxy IR 10q4 -gastro-occult post, hgb dropping but could be a dilutional component as all cell lines dropped- consider PUD? May need GI referral for scope  # AKI on stage 3 CKD: has both diabetic nephropathy and gastroparesis per hx, has not yet seen renal; baseline CR 1.6-1.8. Cr 1.68 this morning -follow with BMETs  -Renal referral ordered as outpatient.  # DM: home regimen 20u lantus qam; A1c 7.2 - lantus 10u while NPO + SSI   # HTN: lisinopril and coreg at home, restarted coreg and amlodipine yesterday, held lisinopril in light of Cr  -will increase amlodipine to 10 today - Added hydralazine PRN SBP>180 and DBP>110. - would hold ACE while Cr elevated, trend with BMETs  -cont vs monitoring  #Cough: onset around time of admission, apparently has niece who is hospitalized with this fluid -pcr pending -will order mucinex and tessalon for cough prn as it worsens abdominal pain  FEN/GI: SLIV, gen diet - Mild hypokalemia - likely from emesis. Repleting with 35mEq IV q 1 hour x 3 Prophylaxis: Heparin SQ, PPI as above     Disposition: Dispo pending toleration of diet and PO pain meds today  Subjective: Worsening cough, but otherwise doing quite well no emesis or diarrhea  Objective: Temp:  [98.9 F (37.2 C)-100.3 F (37.9 C)] 99 F (37.2 C) (12/23 0532) Pulse Rate:  [69-76] 75 (12/23 0836) Resp:  [16-18] 18 (12/23 0532) BP: (141-172)/(58-71) 172/71 mmHg (12/23 0532) SpO2:  [98 %-100 %] 98 % (12/23 0532) Physical Exam: General: NAD,lying in bed coughing Cardiovascular: RRR, no mrg Respiratory: Normal effort, CTAB Abdomen: Soft, generalized tenderness worse in the epigastric region (voluntary guarding), nondistended, normoactive BS Ext: no erythema or edema  Laboratory:  Recent Labs Lab 12/16/13 0500 12/17/13 0500 12/18/13 0515  WBC 11.6* 7.1 4.6  HGB 11.8* 9.8* 9.0*  HCT 33.5* 28.6* 25.7*  PLT 193 164 134*    Recent Labs Lab 12/14/13 1058  12/16/13 0500 12/17/13 0500 12/18/13 0515  NA 140  < > 143 137 137  K 4.2  < > 3.3* 3.4* 3.3*  CL 105  < > 102 104 105  CO2 24  < > 28 26 25   BUN 38*  < > 34* 23 12  CREATININE 2.14*  < > 1.97* 2.01* 1.68*  CALCIUM 9.6  < > 8.6 8.4 7.9*  PROT 7.5  --  7.5  --   --   BILITOT 0.2*  --  0.5  --   --   ALKPHOS 73  --  77  --   --   ALT 13  --  12  --   --   AST 16  --  19  --   --  GLUCOSE 96  < > 159* 78 83  < > = values in this interval not displayed.  Flu pcr pending  Langston Masker, MD 12/18/2013, 9:58 AM PGY-1, [place] Intern pager: [phone], text pages welcome

## 2013-12-18 NOTE — Discharge Summary (Signed)
[name] [name]  Patient name: [name] [name] Medical record number: [id number] Date of birth: 1966/03/04 Age: 47 y.o. Gender: female Date of Admission: 12/15/2013  Date of Discharge: 12/19/13 Admitting Physician: Willeen Niece, MD  Primary Care Provider: Conni Slipper, MD Consultants: none  Indication for Hospitalization: n/v abdominal pain  Discharge Diagnoses/Problem List:  -Cyclic vomiting syndrome -AKI on stage 3 CKD -DMII -HTN -URI  Disposition: home  Discharge Condition: improved  Discharge Exam:  BP 157/74  Pulse 72  Temp(Src) 98.8 F (37.1 C) (Oral)  Resp 18  Ht 5\' 1"  (1.549 m)  Wt 143 lb 8 oz (65.091 kg)  BMI 27.13 kg/m2  SpO2 100% General: NAD,lying in bed coughing  Cardiovascular: RRR, no mrg  Respiratory: Normal effort, CTAB  Abdomen: Soft, tender in epigastric region (voluntary guarding) but improved, nondistended, normoactive BS  Ext: no erythema or edema  Brief Hospital Course:  [name] [name] is a 47 y.o. female presenting with n/v/abdominal pain X2 days. PMH is significant for pancreatitis, diabetes, cyclic vomiting syndrome.   # Cyclic vomiting, abdominal pain: Pt presented to the ED 12/19 after smoking marijuana and eating hot dog the night prior. In the ED she was given IVF and antiemetics. Labs at that time showed Cr 2.14/BUN 38. Pain resolved and was stable for d/c however returned 12/20 with recurrence of pain and emesis. No diarrhea no blood. Reporting red coloration of vomiting. In the ED pt given 2L NS bolus. Labs sig for Cr of 2.43. S/p 1mg  ativan, 25mg  phenergan. Pt was admitted for further w/up of abdominal pain and control of emesis. Differential broad including cyclic vomiting vs gastritis vs PUD vs gastroenteritis vs pancreatitis However given assc with recent ingestion of marijuana (pos UDS) and pinpoint tenderness in the epigastrium felt that pain likely related to cyclic vomting. Hx of  pancreatitis but lipase 44. Additionally pt s/p chole and tbili 0.2. On admission given GI cocktail, zofran and protonix scheduled. HOme meds of ativan and phenergan added prn. Pt initially NPO with fluids for bowel rest. Pt was placed on morphine for pain control. As she continued to improve she was slowly transitioned over to full diet and given oral pain meds (of which she required minimal prns). We re-emphasized the possible correlation of sx to marijuana use and potential benefit if she quits. At time of d/c pt was hemodynamically stable, no concern for PUD and need for further GI imaging. Hgb did drop during admission from 11.5-9 but all cell lines dropped proportionally, felt that this was likely dilutional. May benefit from outpt CBC to monitor for stability. Pt possibly with microscopic GI bleed given hx of intense retching. Very well appearing on day of d/c.   # AKI on stage 3 CKD: Has both diabetic nephropathy and gastroparesis per hx, has not yet seen renal; baseline CR 1.6-1.8. Pt initially with bump in Cr X2 ED visits2.14, 2.34. Felt to be likely to GI losses and prerenal etiology. Cr down trending and wnl pt baseline on day of d/c. Cr 1.68. May benefit from Renal referral as outpatient.   # DM: home regimen 20u lantus qam; A1c 7.2 Lantus 10u while NPO + SSI. No hypoglyemic episodes. D/c'd on home regimen.    # HTN: Historically hard to control BPs. Placed on lopressor 10mg  q6 while NPO with hydralazine prn for SBP >180 and DBP >110. Pt eventually transitioned to coreg and amlodipine (which is a new medication for patient). Amlodipine titrated up to  10mg . Lisinopril initially held in light of Cr but was added back when pt at baseline.   #Cough: Onset around time of admission, apparently has niece who is hospitalized with the flu. Pt febrile with tmax 101 and productive sputum. But cannot take tylenol 2/2 allergy and cannot take ibuprofen 2/2 renal fxn. Flu pcr sent and was neg. Mucinx and  tessalon ordered and with some sx improvement. Given pt continuing to spike fevers, 2 view ordered to look for etiology. No evidence of infiltrate. Likely viral URI.   Issues for Follow Up:  1. CBC from drop in hgb, hemodynamically stable.  2. Possibility of outpt renal referral  Significant Procedures: None  Significant Labs and Imaging:   Recent Labs Lab 12/16/13 0500 12/17/13 0500 12/18/13 0515  WBC 11.6* 7.1 4.6  HGB 11.8* 9.8* 9.0*  HCT 33.5* 28.6* 25.7*  PLT 193 164 134*    Recent Labs Lab 12/14/13 1058 12/15/13 1710 12/16/13 0500 12/17/13 0500 12/18/13 0515  NA 140 136 143 137 137  K 4.2 3.4* 3.3* 3.4* 3.3*  CL 105 88* 102 104 105  CO2 24 29 28 26 25   GLUCOSE 96 260* 159* 78 83  BUN 38* 40* 34* 23 12  CREATININE 2.14* 2.43* 1.97* 2.01* 1.68*  CALCIUM 9.6 9.9 8.6 8.4 7.9*  ALKPHOS 73  --  77  --   --   AST 16  --  19  --   --   ALT 13  --  12  --   --   ALBUMIN 4.0  --  3.9  --   --      Results/Tests Pending at Time of Discharge: none  Discharge Medications:    Medication List    STOP taking these medications       BOOSTRIX 5-2.5-18.5 LF-MCG/0.5 injection  Generic drug:  Tdap      TAKE these medications       amLODipine 10 MG tablet  Commonly known as:  NORVASC  Take 1 tablet (10 mg total) by mouth daily.     aspirin EC 81 MG tablet  Take 81 mg by mouth every morning.     benzonatate 100 MG capsule  Commonly known as:  TESSALON  Take 1 capsule (100 mg total) by mouth 2 (two) times daily as needed for cough.     carvedilol 25 MG tablet  Commonly known as:  COREG  Take 1 tablet (25 mg total) by mouth 2 (two) times daily with a meal.     diclofenac sodium 1 % Gel  Commonly known as:  VOLTAREN  Apply 2 g topically 4 (four) times daily as needed (for pain).     guaiFENesin 600 MG 12 hr tablet  Commonly known as:  MUCINEX  Take 1 tablet (600 mg total) by mouth 2 (two) times daily as needed for cough or to loosen phlegm.     insulin  glargine 100 UNIT/ML injection  Commonly known as:  LANTUS  Inject 0.2 mLs (20 Units total) into the skin every morning.     lisinopril 40 MG tablet  Commonly known as:  PRINIVIL,ZESTRIL  Take 1 tablet (40 mg total) by mouth every morning.     LORazepam 1 MG tablet  Commonly known as:  ATIVAN  Take 1 tablet (1 mg total) by mouth 2 (two) times daily as needed for anxiety. Fill on or after 12/28/2013.  Start taking on:  12/28/2013     metoCLOPramide 10 MG tablet  Commonly known  as:  REGLAN  Take 1 tablet (10 mg total) by mouth 4 (four) times daily.     ondansetron 8 MG tablet  Commonly known as:  ZOFRAN  Take 1 tablet (8 mg total) by mouth every 8 (eight) hours as needed.     oxyCODONE 5 MG immediate release tablet  Commonly known as:  Oxy IR/ROXICODONE  Take 1 tablet (5 mg total) by mouth every 8 (eight) hours as needed.     pantoprazole 40 MG tablet  Commonly known as:  PROTONIX  Take 1 tablet (40 mg total) by mouth daily at 12 noon.     polyethylene glycol packet  Commonly known as:  MIRALAX / GLYCOLAX  Take 1 cap twice a day, adjust as needed to produce 1-2 soft bowel movements per day.     promethazine 25 MG suppository  Commonly known as:  PHENERGAN  Place 1 suppository (25 mg total) rectally every 6 (six) hours as needed for nausea (Take only when unable to keep oral phenergan down).     promethazine 25 MG tablet  Commonly known as:  PHENERGAN  Take 1 tablet (25 mg total) by mouth every 6 (six) hours.     SUMAtriptan 50 MG tablet  Commonly known as:  IMITREX  Take 50 mg by mouth every 2 (two) hours as needed for migraine. May repeat in 2 hours if headache persists or recurs.        Discharge Instructions: Please refer to Patient Instructions section of EMR for full details.  Patient was counseled important signs and symptoms that should prompt return to medical care, changes in medications, dietary instructions, activity restrictions, and follow up appointments.    Follow-Up Appointments: Follow-up Information   Schedule an appointment as soon as possible for a visit with Conni Slipper, MD. (in 1 week for hospital f/up)    Specialty:  Family Medicine   Contact information:   [place] [address] [phone]       Langston Masker, MD 12/19/2013, 11:32 AM PGY-1, [place]

## 2013-12-18 NOTE — Progress Notes (Signed)
MD paged about increased temperature.   Dr. Skeet Simmer returned the page, and ordered a chest xray, and IB profen 400mg  every 8 hours to help bring her temperature down.   Will reassess temperature after administration of IB profen

## 2013-12-18 NOTE — Progress Notes (Signed)
Patient has not vomited in over 48 hours.   Patient has been nauseated, but has not vomited since Sunday morning.

## 2013-12-19 LAB — GLUCOSE, CAPILLARY: Glucose-Capillary: 71 mg/dL (ref 70–99)

## 2013-12-19 NOTE — Discharge Summary (Signed)
I discussed with  Dr Marsh.  I agree with their plans documented in their discharge note.  

## 2013-12-19 NOTE — Progress Notes (Signed)
Patient given discharge instructions. Prescriptions called into pharmacy, patient aware. Explained increase in Norvasc. All patient questions were answered.

## 2014-01-02 ENCOUNTER — Inpatient Hospital Stay (HOSPITAL_COMMUNITY)
Admission: EM | Admit: 2014-01-02 | Discharge: 2014-01-04 | DRG: 918 | Disposition: A | Discharge status: Home / Self Care | Payer: Medicare Other | Attending: Family Medicine | Admitting: Family Medicine

## 2014-01-02 ENCOUNTER — Encounter (HOSPITAL_COMMUNITY): Payer: Self-pay | Admitting: Emergency Medicine

## 2014-01-02 DIAGNOSIS — T40904A Poisoning by unspecified psychodysleptics [hallucinogens], undetermined, initial encounter: Principal | ICD-10-CM | POA: Diagnosis present

## 2014-01-02 DIAGNOSIS — I10 Essential (primary) hypertension: Secondary | ICD-10-CM

## 2014-01-02 DIAGNOSIS — E860 Dehydration: Secondary | ICD-10-CM

## 2014-01-02 DIAGNOSIS — E1129 Type 2 diabetes mellitus with other diabetic kidney complication: Secondary | ICD-10-CM | POA: Diagnosis present

## 2014-01-02 DIAGNOSIS — R209 Unspecified disturbances of skin sensation: Secondary | ICD-10-CM | POA: Diagnosis present

## 2014-01-02 DIAGNOSIS — N058 Unspecified nephritic syndrome with other morphologic changes: Secondary | ICD-10-CM | POA: Diagnosis present

## 2014-01-02 DIAGNOSIS — R1115 Cyclical vomiting syndrome unrelated to migraine: Secondary | ICD-10-CM | POA: Diagnosis not present

## 2014-01-02 DIAGNOSIS — F411 Generalized anxiety disorder: Secondary | ICD-10-CM | POA: Diagnosis present

## 2014-01-02 DIAGNOSIS — R0789 Other chest pain: Secondary | ICD-10-CM | POA: Diagnosis present

## 2014-01-02 DIAGNOSIS — E1149 Type 2 diabetes mellitus with other diabetic neurological complication: Secondary | ICD-10-CM

## 2014-01-02 DIAGNOSIS — I129 Hypertensive chronic kidney disease with stage 1 through stage 4 chronic kidney disease, or unspecified chronic kidney disease: Secondary | ICD-10-CM | POA: Diagnosis present

## 2014-01-02 DIAGNOSIS — N183 Chronic kidney disease, stage 3 unspecified: Secondary | ICD-10-CM | POA: Diagnosis present

## 2014-01-02 DIAGNOSIS — N189 Chronic kidney disease, unspecified: Secondary | ICD-10-CM

## 2014-01-02 DIAGNOSIS — R1013 Epigastric pain: Secondary | ICD-10-CM

## 2014-01-02 DIAGNOSIS — F17200 Nicotine dependence, unspecified, uncomplicated: Secondary | ICD-10-CM | POA: Diagnosis present

## 2014-01-02 DIAGNOSIS — T43591A Poisoning by other antipsychotics and neuroleptics, accidental (unintentional), initial encounter: Secondary | ICD-10-CM | POA: Diagnosis present

## 2014-01-02 DIAGNOSIS — F1210 Cannabis abuse, uncomplicated: Secondary | ICD-10-CM | POA: Diagnosis present

## 2014-01-02 DIAGNOSIS — N179 Acute kidney failure, unspecified: Secondary | ICD-10-CM | POA: Diagnosis present

## 2014-01-02 DIAGNOSIS — I252 Old myocardial infarction: Secondary | ICD-10-CM

## 2014-01-02 LAB — COMPREHENSIVE METABOLIC PANEL
ALK PHOS: 83 U/L (ref 39–117)
ALT: 16 U/L (ref 0–35)
AST: 20 U/L (ref 0–37)
Albumin: 4.4 g/dL (ref 3.5–5.2)
BUN: 34 mg/dL — AB (ref 6–23)
CO2: 25 mEq/L (ref 19–32)
CREATININE: 1.96 mg/dL — AB (ref 0.50–1.10)
Calcium: 10.4 mg/dL (ref 8.4–10.5)
Chloride: 102 mEq/L (ref 96–112)
GFR calc non Af Amer: 29 mL/min — ABNORMAL LOW (ref >90)
GFR, EST AFRICAN AMERICAN: 34 mL/min — AB (ref >90)
GLUCOSE: 141 mg/dL — AB (ref 70–99)
POTASSIUM: 4.9 meq/L (ref 3.7–5.3)
Sodium: 142 mEq/L (ref 137–147)
TOTAL PROTEIN: 8.7 g/dL — AB (ref 6.0–8.3)
Total Bilirubin: 0.3 mg/dL (ref 0.3–1.2)

## 2014-01-02 LAB — CBC WITH DIFFERENTIAL/PLATELET
Basophils Absolute: 0 10*3/uL (ref 0.0–0.1)
Basophils Relative: 0 % (ref 0–1)
EOS ABS: 0.2 10*3/uL (ref 0.0–0.7)
Eosinophils Relative: 2 % (ref 0–5)
HEMATOCRIT: 35.7 % — AB (ref 36.0–46.0)
HEMOGLOBIN: 12 g/dL (ref 12.0–15.0)
LYMPHS ABS: 3 10*3/uL (ref 0.7–4.0)
Lymphocytes Relative: 38 % (ref 12–46)
MCH: 31.3 pg (ref 26.0–34.0)
MCHC: 33.6 g/dL (ref 30.0–36.0)
MCV: 93.2 fL (ref 78.0–100.0)
MONO ABS: 0.5 10*3/uL (ref 0.1–1.0)
MONOS PCT: 6 % (ref 3–12)
NEUTROS PCT: 54 % (ref 43–77)
Neutro Abs: 4.3 10*3/uL (ref 1.7–7.7)
Platelets: 357 10*3/uL (ref 150–400)
RBC: 3.83 MIL/uL — AB (ref 3.87–5.11)
RDW: 14.6 % (ref 11.5–15.5)
WBC: 7.9 10*3/uL (ref 4.0–10.5)

## 2014-01-02 LAB — URINALYSIS, ROUTINE W REFLEX MICROSCOPIC
BILIRUBIN URINE: NEGATIVE
Glucose, UA: NEGATIVE mg/dL
Hgb urine dipstick: NEGATIVE
Ketones, ur: NEGATIVE mg/dL
Leukocytes, UA: NEGATIVE
Nitrite: NEGATIVE
Protein, ur: 100 mg/dL — AB
SPECIFIC GRAVITY, URINE: 1.013 (ref 1.005–1.030)
UROBILINOGEN UA: 0.2 mg/dL (ref 0.0–1.0)
pH: 7 (ref 5.0–8.0)

## 2014-01-02 LAB — URINE MICROSCOPIC-ADD ON

## 2014-01-02 LAB — GLUCOSE, CAPILLARY: Glucose-Capillary: 111 mg/dL — ABNORMAL HIGH (ref 70–99)

## 2014-01-02 LAB — OCCULT BLOOD GASTRIC / DUODENUM (SPECIMEN CUP): Occult Blood, Gastric: POSITIVE — AB

## 2014-01-02 LAB — MAGNESIUM: MAGNESIUM: 1.8 mg/dL (ref 1.5–2.5)

## 2014-01-02 LAB — LIPASE, BLOOD: Lipase: 77 U/L — ABNORMAL HIGH (ref 11–59)

## 2014-01-02 LAB — TROPONIN I: Troponin I: <0.3 ng/mL (ref <0.30)

## 2014-01-02 MED ORDER — PROMETHAZINE HCL 25 MG PO TABS: 12.5000 mg | ORAL_TABLET | Freq: Four times a day (QID) | ORAL | Status: DC | PRN

## 2014-01-02 MED ORDER — HYDROMORPHONE HCL PF 1 MG/ML IJ SOLN
0.5000 mg | INTRAMUSCULAR | Status: DC | PRN
  Administered 2014-01-02 – 2014-01-03 (×3): 0.5 mg via INTRAVENOUS
  Filled 2014-01-02 (×3): qty 1

## 2014-01-02 MED ORDER — CARVEDILOL 25 MG PO TABS
25.0000 mg | ORAL_TABLET | Freq: Two times a day (BID) | ORAL | Status: DC
  Administered 2014-01-03 – 2014-01-04 (×4): 25 mg via ORAL
  Filled 2014-01-02 (×5): qty 1

## 2014-01-02 MED ORDER — INSULIN GLARGINE 100 UNIT/ML ~~LOC~~ SOLN
10.0000 [IU] | Freq: Every morning | SUBCUTANEOUS | Status: DC
  Administered 2014-01-03: 10:00:00 10 [IU] via SUBCUTANEOUS
  Filled 2014-01-02 (×2): qty 0.1

## 2014-01-02 MED ORDER — SODIUM CHLORIDE 0.9 % IV BOLUS (SEPSIS)
1000.0000 mL | Freq: Once | INTRAVENOUS | Status: AC
  Administered 2014-01-02: 1000 mL via INTRAVENOUS

## 2014-01-02 MED ORDER — PROMETHAZINE HCL 12.5 MG RE SUPP: 12.5000 mg | Freq: Four times a day (QID) | RECTAL | Status: DC | PRN

## 2014-01-02 MED ORDER — HYDROMORPHONE HCL PF 1 MG/ML IJ SOLN
1.0000 mg | Freq: Once | INTRAMUSCULAR | Status: AC
  Administered 2014-01-02: 1 mg via INTRAVENOUS
  Filled 2014-01-02: qty 1

## 2014-01-02 MED ORDER — LORAZEPAM 2 MG/ML IJ SOLN
1.0000 mg | Freq: Once | INTRAMUSCULAR | Status: AC
  Administered 2014-01-02: 1 mg via INTRAVENOUS
  Filled 2014-01-02: qty 1

## 2014-01-02 MED ORDER — ONDANSETRON 8 MG/NS 50 ML IVPB
8.0000 mg | Freq: Four times a day (QID) | INTRAVENOUS | Status: DC | PRN
  Administered 2014-01-02: 8 mg via INTRAVENOUS
  Filled 2014-01-02 (×3): qty 8

## 2014-01-02 MED ORDER — SODIUM CHLORIDE 0.9 % IV SOLN
INTRAVENOUS | Status: AC
  Administered 2014-01-02: 19:00:00 via INTRAVENOUS

## 2014-01-02 MED ORDER — LORAZEPAM 2 MG/ML IJ SOLN
1.0000 mg | Freq: Two times a day (BID) | INTRAMUSCULAR | Status: DC | PRN
  Administered 2014-01-02: 1 mg via INTRAVENOUS
  Filled 2014-01-02: qty 1

## 2014-01-02 MED ORDER — AMLODIPINE BESYLATE 10 MG PO TABS
10.0000 mg | ORAL_TABLET | Freq: Every day | ORAL | Status: DC
  Administered 2014-01-02 – 2014-01-04 (×3): 10 mg via ORAL
  Filled 2014-01-02 (×3): qty 1

## 2014-01-02 MED ORDER — ONDANSETRON 8 MG PO TBDP
8.0000 mg | ORAL_TABLET | Freq: Once | ORAL | Status: DC
  Filled 2014-01-02 (×2): qty 2

## 2014-01-02 MED ORDER — PROMETHAZINE HCL 25 MG/ML IJ SOLN
12.5000 mg | Freq: Once | INTRAMUSCULAR | Status: AC
  Administered 2014-01-02: 12.5 mg via INTRAVENOUS
  Filled 2014-01-02: qty 1

## 2014-01-02 MED ORDER — PROMETHAZINE HCL 25 MG/ML IJ SOLN
6.2500 mg | Freq: Four times a day (QID) | INTRAMUSCULAR | Status: DC | PRN
Start: 2014-01-02 — End: 2014-01-03
  Administered 2014-01-03 (×2): 6.25 mg via INTRAVENOUS
  Filled 2014-01-02 (×2): qty 1

## 2014-01-02 MED ORDER — SODIUM CHLORIDE 0.9 % IV SOLN
INTRAVENOUS | Status: DC
  Administered 2014-01-02 – 2014-01-03 (×4): via INTRAVENOUS
  Administered 2014-01-04: 1000 mL via INTRAVENOUS

## 2014-01-02 MED ORDER — CHLORHEXIDINE GLUCONATE 0.12 % MT SOLN
15.0000 mL | Freq: Two times a day (BID) | OROMUCOSAL | Status: DC
  Administered 2014-01-02 – 2014-01-04 (×4): 15 mL via OROMUCOSAL
  Filled 2014-01-02 (×6): qty 15

## 2014-01-02 MED ORDER — SODIUM CHLORIDE 0.9 % IJ SOLN: 3.0000 mL | Freq: Two times a day (BID) | INTRAMUSCULAR | Status: DC

## 2014-01-02 MED ORDER — PANTOPRAZOLE SODIUM 40 MG IV SOLR
40.0000 mg | Freq: Two times a day (BID) | INTRAVENOUS | Status: DC
  Administered 2014-01-02: 21:00:00 40 mg via INTRAVENOUS
  Filled 2014-01-02 (×3): qty 40

## 2014-01-02 MED ORDER — METOCLOPRAMIDE HCL 5 MG/ML IJ SOLN
10.0000 mg | Freq: Once | INTRAMUSCULAR | Status: AC
  Administered 2014-01-02: 10 mg via INTRAVENOUS
  Filled 2014-01-02: qty 2

## 2014-01-02 MED ORDER — GI COCKTAIL ~~LOC~~
30.0000 mL | Freq: Once | ORAL | Status: AC
  Administered 2014-01-02: 30 mL via ORAL
  Filled 2014-01-02: qty 30

## 2014-01-02 MED ORDER — SODIUM CHLORIDE 0.9 % IJ SOLN
10.0000 mL | INTRAMUSCULAR | Status: DC | PRN
  Administered 2014-01-04: 18:00:00 10 mL

## 2014-01-02 MED ORDER — BIOTENE DRY MOUTH MT LIQD
15.0000 mL | Freq: Two times a day (BID) | OROMUCOSAL | Status: DC
  Administered 2014-01-03 – 2014-01-04 (×4): 15 mL via OROMUCOSAL

## 2014-01-02 NOTE — ED Notes (Signed)
Admitting MDs at bedside.

## 2014-01-02 NOTE — ED Provider Notes (Signed)
CSN: [id number]     Arrival date & time 01/02/14  1228 History   First MD Initiated Contact with Patient 01/02/14 1526     Chief Complaint  Patient presents with  . Emesis   (Consider location/radiation/quality/duration/timing/severity/associated sxs/prior Treatment) HPI Comments: 48 year old female with a history of cyclic vomiting syndrome presents with recurrent vomiting over the past 10 hours. She's lost count how many times she's vomited. She states she took oral and rectal Phenergan but it had no effect. She normally takes Ativan which helps the symptoms as well but she's been out for 2 days. She states that the pharmacy would not allow her to refill the prescriptions for another 3 days. She also feels burning epigastric pain that is a 9/10 is similar to other pain she's had with this disease. She also has back pain associated with it. She's not had any urinary symptoms or diarrhea. No fevers. She was seen and admitted last time she was here a little over 2 weeks ago due to acute on chronic renal failure. She was given 8 mg ODT zofran but still feels ill.   Past Medical History  Diagnosis Date  . PANCREATITIS 05/09/2008  . Cyclic vomiting syndrome   . Hypertension   . Complication of anesthesia     "problems waking up"  . Heart murmur   . Myocardial infarction 07/2007    "they say I've had a silent one; I don't know"  . Pneumonia   . Anemia   . Blood transfusion   . GERD (gastroesophageal reflux disease)   . Anxiety   . Depression   . Smoker   . Marijuana smoker, continuous   . NEUROPATHY, DIABETIC 02/23/2007  . Type II diabetes mellitus   . Diabetic gastroparesis     /e-chart  . Chronic kidney disease (CKD), stage III (moderate)    Past Surgical History  Procedure Laterality Date  . Port-a-cath placement  ~ 2008    right chest; "poor access; frequent hospitalizations"  . Laparotomy and lysis of adhesions    . Abdominal hysterectomy  02/2002  . Cholecystectomy  1980's  .  Cardiac catheterization    . Dilation and evacuation  X3  . Left hand surgery    . Cataract extraction w/ intraocular lens  implant, bilateral    . Colonoscopy    . Upper gi endoscopy     Family History  Problem Relation Age of Onset  . Diabetes type II Mother   . Diabetes Mother   . Hypertension Mother   . Diabetes type II Sister   . Diabetes Sister   . Hypertension Sister   . Hypertension Brother    History  Substance Use Topics  . Smoking status: Current Some Day Smoker -- 0.50 packs/day for 22 years    Types: Cigarettes  . Smokeless tobacco: Never Used  . Alcohol Use: No   OB History   Grav Para Term Preterm Abortions TAB SAB Ect Mult Living                 Review of Systems  Constitutional: Negative for fever.  Respiratory: Negative for shortness of breath.   Gastrointestinal: Positive for nausea, vomiting and abdominal pain. Negative for diarrhea.  Genitourinary: Negative for dysuria.  Musculoskeletal: Positive for back pain.  All other systems reviewed and are negative.    Allergies  Bee venom; Acetaminophen; Novolog; and Erythromycin  Home Medications   Current Outpatient Rx  Name  Route  Sig  Dispense  Refill  .  amLODipine (NORVASC) 10 MG tablet   Oral   Take 1 tablet (10 mg total) by mouth daily.   30 tablet   0   . aspirin EC 81 MG tablet   Oral   Take 81 mg by mouth every morning.         . benzonatate (TESSALON) 100 MG capsule   Oral   Take 1 capsule (100 mg total) by mouth 2 (two) times daily as needed for cough.   20 capsule   0   . carvedilol (COREG) 25 MG tablet   Oral   Take 1 tablet (25 mg total) by mouth 2 (two) times daily with a meal.   60 tablet   5   . diclofenac sodium (VOLTAREN) 1 % GEL   Topical   Apply 2 g topically 4 (four) times daily as needed (for pain).          Marland Kitchen guaiFENesin (MUCINEX) 600 MG 12 hr tablet   Oral   Take 1 tablet (600 mg total) by mouth 2 (two) times daily as needed for cough or to loosen  phlegm.   12 tablet   0   . insulin glargine (LANTUS) 100 UNIT/ML injection   Subcutaneous   Inject 0.2 mLs (20 Units total) into the skin every morning.   10 mL   5     Please fill enough for 1 month supply   . lisinopril (PRINIVIL,ZESTRIL) 40 MG tablet   Oral   Take 1 tablet (40 mg total) by mouth every morning.   30 tablet   5   . LORazepam (ATIVAN) 1 MG tablet   Oral   Take 1 tablet (1 mg total) by mouth 2 (two) times daily as needed for anxiety. Fill on or after 12/28/2013.   60 tablet   0   . metoCLOPramide (REGLAN) 10 MG tablet   Oral   Take 1 tablet (10 mg total) by mouth 4 (four) times daily.   120 tablet   0   . ondansetron (ZOFRAN) 8 MG tablet   Oral   Take 1 tablet (8 mg total) by mouth every 8 (eight) hours as needed.   60 tablet   0   . oxyCODONE (OXY IR/ROXICODONE) 5 MG immediate release tablet   Oral   Take 1 tablet (5 mg total) by mouth every 8 (eight) hours as needed.   30 tablet   0   . pantoprazole (PROTONIX) 40 MG tablet   Oral   Take 1 tablet (40 mg total) by mouth daily at 12 noon.   30 tablet   0   . polyethylene glycol (MIRALAX / GLYCOLAX) packet      Take 1 cap twice a day, adjust as needed to produce 1-2 soft bowel movements per day.   100 each   0   . promethazine (PHENERGAN) 25 MG suppository   Rectal   Place 1 suppository (25 mg total) rectally every 6 (six) hours as needed for nausea (Take only when unable to keep oral phenergan down).   12 each   0   . promethazine (PHENERGAN) 25 MG tablet   Oral   Take 1 tablet (25 mg total) by mouth every 6 (six) hours.   120 tablet   1   . SUMAtriptan (IMITREX) 50 MG tablet   Oral   Take 50 mg by mouth every 2 (two) hours as needed for migraine. May repeat in 2 hours if headache persists or recurs.  BP 195/101  Pulse 116  Temp(Src) 98.3 F (36.8 C) (Oral)  Resp 18  Wt 137 lb 1.6 oz (62.188 kg)  SpO2 100% Physical Exam  Nursing note and vitals  reviewed. Constitutional: She is oriented to person, place, and time. She appears well-developed and well-nourished.  HENT:  Head: Normocephalic and atraumatic.  Right Ear: External ear normal.  Left Ear: External ear normal.  Nose: Nose normal.  Eyes: Right eye exhibits no discharge. Left eye exhibits no discharge.  Cardiovascular: Regular rhythm and normal heart sounds.  Tachycardia present.   Pulmonary/Chest: Effort normal and breath sounds normal.  Abdominal: Soft. There is tenderness in the epigastric area.  Neurological: She is alert and oriented to person, place, and time.  Skin: Skin is warm. She is diaphoretic.    ED Course  Procedures (including critical care time) Labs Review Labs Reviewed  CBC WITH DIFFERENTIAL - Abnormal; Notable for the following:    RBC 3.83 (*)    HCT 35.7 (*)    All other components within normal limits  COMPREHENSIVE METABOLIC PANEL - Abnormal; Notable for the following:    Glucose, Bld 141 (*)    BUN 34 (*)    Creatinine, Ser 1.96 (*)    Total Protein 8.7 (*)    GFR calc non Af Amer 29 (*)    GFR calc Af Amer 34 (*)    All other components within normal limits  LIPASE, BLOOD - Abnormal; Notable for the following:    Lipase 77 (*)    All other components within normal limits  URINALYSIS, ROUTINE W REFLEX MICROSCOPIC - Abnormal; Notable for the following:    Protein, ur 100 (*)    All other components within normal limits  URINE MICROSCOPIC-ADD ON - Abnormal; Notable for the following:    Squamous Epithelial / LPF FEW (*)    All other components within normal limits   Imaging Review No results found.  EKG Interpretation   None       MDM   1. Cyclical vomiting   2. Dehydration    Patient with worsening renal insufficiency, tachycardia despite fluids and pain meds, and poor PO intake. Abd is otherwise benign and chronic. Will need admission for fluids and nausea/vomiting control.     Ephraim Hamburger, MD 01/03/14 [phone]

## 2014-01-02 NOTE — ED Notes (Signed)
Pt here for vomiting. sts for a few days. sts chronic vomiting and was recently here for the same. Pt having upper abdominal pain.

## 2014-01-02 NOTE — Progress Notes (Signed)
[name], MD called and stated he was putting in order for Ativan. Will continue to monitor patient. [name] [name]

## 2014-01-02 NOTE — H&P (Signed)
[facility] Admission History and Physical Service Pager: [phone]  Patient name: [name] [name] Medical record number: [id number] Date of birth: 09-17-1966 Age: 48 y.o. Gender: female  Primary Care Provider: Conni Slipper, MD Consultants: None Code Status: Full  Chief Complaint: Vomiting; Chest Pain  Assessment and Plan: [name] [name] is a 48 y.o. female presenting with n/v/abdominal pain. PMH is significant for pancreatitis, diabetes, cyclic vomiting syndrome.   # N/V/abdominal pain: ddx includes cyclic vomiting vs gastritis vs PUD vs gastroenteritis vs pancreatitis. Recent ingestion of marijuana 8 days ago, likely related to sx. Tender epigastrium but no signs of acute abdomen. Hgb stable at 12 Pt afebrile with WBC normal. Has hx of pancreatitis, Lipase 77. Hx of cholecystectomy and hysterectomy. UA neg for infection - Admit for observation to med-surg inpatient, [facility], attending Dr Gwendlyn Deutscher  - GI cocktail now  - zofran prn and scheduled protonix 40 IV BID  - phenergan prn  - monitor CBC for hgb drop - gastro-occult/fecal occult given reported blood in vomitus - NPO with ice chips and sips with meds, advance when pain controlled w/o IV meds  - Dilaudid 0.5mg  q3 prn - Re-emphasized possible correlation of symptoms to marijuana use  #AKI on stage 3 CKD: has both diabetic nephropathy per hx, has not yet seen renal; baseline CR 1.6-1.8. Cr 1.96 on admission. Most likely related to vomiting and prerenal etiology.  -rehydration NS @125 ; Received 2 Liters in ED -follow with BMETs   # Atypical chest pain, tachycardia: likely epigastric due to burning nature and Vomiting with some component of dehydration. Cardiac risk factors include tobacco use, HTN, and DM.  -EKG, trops X2   # Hand numbness - Intermittent right hand numbness today that has resolved; No focal neurological signs - Check Mg level - Consider CT if  neurological signs or symptoms return  # DM: home regimen 20u lantus qam, A1C 7.2 on 12/16/13 - lantus 10u while NPO   # HTN: norvasc and coreg at home, elevated in the ED while in pain.  - Continue home meds w/ sips  FEN/GI: NPO except ice chips for relief of burning, NS @125   Prophylaxis: SCDs as patient with potential GI bleed, PPI as above   Disposition: Observation; Attending Gwendlyn Deutscher; Dispo pending improvement in pain and emesis  History of Present Illness: [name] [name] is a 48 y.o. female presenting with nausea and vomiting that began this morning about 5am. She reports some blood in vomit she says "what you would call coffee grinds." She can't tell us how many times she has vomited. She also endorses HA, substernal burning chest/epigastric pain, and 3-4 episodes of right hand numbness. This hand numbness occurred over her entire hand. The episodes occurred between 8 am and 10 am. She did not have any associated weakness with this. She denies diarrhea, vision changes, or burning with urination.  Reports smoking marijuana 8 days ago. Denies alcohol use in past several months, but smokes 1/3 pack per day.   In the ED she received ativan, zofran, and phenergan with minimal benefit and she received 2 1 L boluses. We were asked to admit her for cyclic vomiting.  Review Of Systems: Per HPI with the following additions:  Otherwise 12 point review of systems was performed and was unremarkable.  Patient Active Problem List   Diagnosis Date Noted  . Protein-calorie malnutrition, severe 12/17/2013  . Acute epigastric pain 12/15/2013  . Cyclical vomiting [id number]  . Anemia 11/15/2013  .  Pancreatitis, acute 10/14/2013  . CKD (chronic kidney disease) 08/17/2013  . Chest pain 06/25/2013  . Abdominal pain, epigastric 04/19/2013  . Esophagitis 04/18/2013  . Preventative health care 09/18/2012  . High risk social situation 08/31/2012  . Allergic rhinitis, seasonal 05/04/2012  . Marijuana  smoker 11/03/2011  . Uncontrolled secondary diabetes mellitus with stage 3 CKD (GFR 30-59) 10/23/2011  . Cyclic vomiting syndrome 08/03/2011  . Hot flashes 07/29/2011  . BACK PAIN, CHRONIC 04/10/2007  . DEPRESSION, MAJOR, RECURRENT 02/23/2007  . TOBACCO DEPENDENCE 02/23/2007  . MIGRAINE, UNSPEC., W/O INTRACTABLE MIGRAINE 02/23/2007  . Essential hypertension, malignant 02/23/2007  . INSOMNIA NOS 02/23/2007  . NEUROPATHY, DIABETIC 02/23/2007  . GERD 01/27/2004   Past Medical History: Past Medical History  Diagnosis Date  . PANCREATITIS 05/09/2008  . Cyclic vomiting syndrome   . Hypertension   . Complication of anesthesia     "problems waking up"  . Heart murmur   . Myocardial infarction 07/2007    "they say I've had a silent one; I don't know"  . Pneumonia   . Anemia   . Blood transfusion   . GERD (gastroesophageal reflux disease)   . Anxiety   . Depression   . Smoker   . Marijuana smoker, continuous   . NEUROPATHY, DIABETIC 02/23/2007  . Type II diabetes mellitus   . Diabetic gastroparesis     /e-chart  . Chronic kidney disease (CKD), stage III (moderate)    Past Surgical History: Past Surgical History  Procedure Laterality Date  . Port-a-cath placement  ~ 2008    right chest; "poor access; frequent hospitalizations"  . Laparotomy and lysis of adhesions    . Abdominal hysterectomy  02/2002  . Cholecystectomy  1980's  . Cardiac catheterization    . Dilation and evacuation  X3  . Left hand surgery    . Cataract extraction w/ intraocular lens  implant, bilateral    . Colonoscopy    . Upper gi endoscopy     Social History: History  Substance Use Topics  . Smoking status: Current Some Day Smoker -- 0.50 packs/day for 22 years    Types: Cigarettes  . Smokeless tobacco: Never Used  . Alcohol Use: No   Additional social history:   Please also refer to relevant sections of EMR.  Family History: Family History  Problem Relation Age of Onset  . Diabetes type II  Mother   . Diabetes Mother   . Hypertension Mother   . Diabetes type II Sister   . Diabetes Sister   . Hypertension Sister   . Hypertension Brother    Allergies and Medications: Allergies  Allergen Reactions  . Bee Venom Anaphylaxis  . Acetaminophen Nausea And Vomiting  . Novolog [Insulin Aspart] Other (See Comments)    Muscles in feet cramp  . Erythromycin Nausea And Vomiting   Current Facility-Administered Medications on File Prior to Encounter  Medication Dose Route Frequency Provider Last Rate Last Dose  . 0.9 %  sodium chloride infusion   Intravenous Continuous Andrena Mews, MD 20 mL/hr at 04/19/13 1500 500 mL at 04/19/13 1500   Current Outpatient Prescriptions on File Prior to Encounter  Medication Sig Dispense Refill  . amLODipine (NORVASC) 10 MG tablet Take 1 tablet (10 mg total) by mouth daily.  30 tablet  0  . carvedilol (COREG) 25 MG tablet Take 1 tablet (25 mg total) by mouth 2 (two) times daily with a meal.  60 tablet  5  . diclofenac sodium (VOLTAREN) 1 %  GEL Apply 2 g topically 4 (four) times daily as needed (for pain).       . insulin glargine (LANTUS) 100 UNIT/ML injection Inject 0.2 mLs (20 Units total) into the skin every morning.  10 mL  5  . lisinopril (PRINIVIL,ZESTRIL) 40 MG tablet Take 1 tablet (40 mg total) by mouth every morning.  30 tablet  5  . LORazepam (ATIVAN) 1 MG tablet Take 1 tablet (1 mg total) by mouth 2 (two) times daily as needed for anxiety. Fill on or after 12/28/2013.  60 tablet  0  . oxyCODONE (OXY IR/ROXICODONE) 5 MG immediate release tablet Take 1 tablet (5 mg total) by mouth every 8 (eight) hours as needed.  30 tablet  0  . SUMAtriptan (IMITREX) 50 MG tablet Take 50 mg by mouth every 2 (two) hours as needed for migraine. May repeat in 2 hours if headache persists or recurs.        Objective: BP 168/94  Pulse 111  Temp(Src) 98.4 F (36.9 C) (Oral)  Resp 20  Wt 137 lb 1.6 oz (62.188 kg)  SpO2 100% Exam: General: Obese Female;  NAD HEENT: NCAT, dry MM, sclera clear  Cardiovascular: tachycardic, reg rhythm, no m/r/g  Respiratory: CTAB, no wheezing  Abdomen: soft, obese, epigastric tenderness mostly, with mild tenderness throughout; no rebound or guarding Extremities: WWP, no LE edema  Skin: no rashes or lesions  Neuro: A&O;  PERRL, EOMI; CN 2-12 intact, 5/5 strength biceps, triceps, grip, hip flexors, quads, plantar and dorsiflexion, sensation to light touch intact throughout bilateral upper and lower extremities, 2+ patellar reflexes  Labs and Imaging: CBC BMET   Recent Labs Lab 01/02/14 1500  WBC 7.9  HGB 12.0  HCT 35.7*  PLT 357    Recent Labs Lab 01/02/14 1500  NA 142  K 4.9  CL 102  CO2 25  BUN 34*  CREATININE 1.96*  GLUCOSE 141*  CALCIUM 10.4     Lipase 77 UA 100 protein, otherwise nml  Phill Myron, MD 01/02/2014, 6:15 PM PGY-1, [place] Intern pager: [phone], text pages welcome  Upper Level Addendum:  I have seen and evaluated this patient along with Dr. Berkley Harvey and reviewed the above note, making necessary revisions in red.   Tommi Rumps, MD Family Medicine PGY-2

## 2014-01-02 NOTE — Progress Notes (Signed)
Patient admitted to [place] from ED. Patient is A&Ox4. Patient lives at home alone. Patient's skin warm, dry and intact.  Patient placed on tele. Will continue to monitor patient.

## 2014-01-02 NOTE — Progress Notes (Signed)
Notified Provider oncall that pt having active vomiting at this time. Gave zofran IV ineffective, not time for phenergan yet. No new orders given. Will continue to monitor patient. Ranelle Oyster, RN

## 2014-01-03 DIAGNOSIS — R1115 Cyclical vomiting syndrome unrelated to migraine: Secondary | ICD-10-CM | POA: Diagnosis not present

## 2014-01-03 DIAGNOSIS — R1013 Epigastric pain: Secondary | ICD-10-CM | POA: Diagnosis not present

## 2014-01-03 DIAGNOSIS — T40904A Poisoning by unspecified psychodysleptics [hallucinogens], undetermined, initial encounter: Secondary | ICD-10-CM | POA: Diagnosis not present

## 2014-01-03 DIAGNOSIS — I129 Hypertensive chronic kidney disease with stage 1 through stage 4 chronic kidney disease, or unspecified chronic kidney disease: Secondary | ICD-10-CM | POA: Diagnosis not present

## 2014-01-03 DIAGNOSIS — E1129 Type 2 diabetes mellitus with other diabetic kidney complication: Secondary | ICD-10-CM | POA: Diagnosis not present

## 2014-01-03 DIAGNOSIS — I10 Essential (primary) hypertension: Secondary | ICD-10-CM

## 2014-01-03 DIAGNOSIS — N189 Chronic kidney disease, unspecified: Secondary | ICD-10-CM

## 2014-01-03 DIAGNOSIS — N179 Acute kidney failure, unspecified: Secondary | ICD-10-CM | POA: Diagnosis not present

## 2014-01-03 DIAGNOSIS — E1149 Type 2 diabetes mellitus with other diabetic neurological complication: Secondary | ICD-10-CM

## 2014-01-03 LAB — COMPREHENSIVE METABOLIC PANEL
ALBUMIN: 3.6 g/dL (ref 3.5–5.2)
ALT: 15 U/L (ref 0–35)
AST: 19 U/L (ref 0–37)
Alkaline Phosphatase: 70 U/L (ref 39–117)
BILIRUBIN TOTAL: 0.3 mg/dL (ref 0.3–1.2)
BUN: 29 mg/dL — ABNORMAL HIGH (ref 6–23)
CO2: 23 meq/L (ref 19–32)
CREATININE: 1.71 mg/dL — AB (ref 0.50–1.10)
Calcium: 9 mg/dL (ref 8.4–10.5)
Chloride: 108 mEq/L (ref 96–112)
GFR, EST AFRICAN AMERICAN: 40 mL/min — AB (ref >90)
GFR, EST NON AFRICAN AMERICAN: 34 mL/min — AB (ref >90)
GLUCOSE: 102 mg/dL — AB (ref 70–99)
Potassium: 4.2 mEq/L (ref 3.7–5.3)
Sodium: 144 mEq/L (ref 137–147)
Total Protein: 7.2 g/dL (ref 6.0–8.3)

## 2014-01-03 LAB — CBC
HCT: 29.2 % — ABNORMAL LOW (ref 36.0–46.0)
Hemoglobin: 10.1 g/dL — ABNORMAL LOW (ref 12.0–15.0)
MCH: 31.8 pg (ref 26.0–34.0)
MCHC: 34.6 g/dL (ref 30.0–36.0)
MCV: 91.8 fL (ref 78.0–100.0)
PLATELETS: 252 10*3/uL (ref 150–400)
RBC: 3.18 MIL/uL — AB (ref 3.87–5.11)
RDW: 14.2 % (ref 11.5–15.5)
WBC: 7.7 10*3/uL (ref 4.0–10.5)

## 2014-01-03 LAB — TROPONIN I: Troponin I: <0.3 ng/mL (ref <0.30)

## 2014-01-03 LAB — GLUCOSE, CAPILLARY
GLUCOSE-CAPILLARY: 96 mg/dL (ref 70–99)
GLUCOSE-CAPILLARY: 77 mg/dL (ref 70–99)
GLUCOSE-CAPILLARY: 100 mg/dL — AB (ref 70–99)
Glucose-Capillary: 48 mg/dL — ABNORMAL LOW (ref 70–99)
Glucose-Capillary: 247 mg/dL — ABNORMAL HIGH (ref 70–99)

## 2014-01-03 MED ORDER — OXYCODONE HCL 5 MG PO TABS
5.0000 mg | ORAL_TABLET | Freq: Four times a day (QID) | ORAL | Status: DC | PRN
  Administered 2014-01-03 – 2014-01-04 (×3): 5 mg via ORAL
  Filled 2014-01-03 (×3): qty 1

## 2014-01-03 MED ORDER — PROMETHAZINE HCL 25 MG PO TABS
12.5000 mg | ORAL_TABLET | Freq: Four times a day (QID) | ORAL | Status: DC | PRN
  Administered 2014-01-04 (×3): 12.5 mg via ORAL
  Filled 2014-01-03 (×3): qty 1

## 2014-01-03 MED ORDER — GI COCKTAIL ~~LOC~~
30.0000 mL | Freq: Two times a day (BID) | ORAL | Status: DC | PRN
  Administered 2014-01-03 – 2014-01-04 (×3): 30 mL via ORAL
  Filled 2014-01-03 (×3): qty 30

## 2014-01-03 MED ORDER — OMEPRAZOLE 20 MG PO CPDR: 20.0000 mg | DELAYED_RELEASE_CAPSULE | Freq: Every day | ORAL | Status: DC

## 2014-01-03 MED ORDER — ONDANSETRON HCL 4 MG PO TABS: 4.0000 mg | ORAL_TABLET | Freq: Three times a day (TID) | ORAL | Status: DC | PRN

## 2014-01-03 MED ORDER — PANTOPRAZOLE SODIUM 40 MG PO TBEC
40.0000 mg | DELAYED_RELEASE_TABLET | Freq: Two times a day (BID) | ORAL | Status: DC
  Administered 2014-01-03 – 2014-01-04 (×3): 40 mg via ORAL
  Filled 2014-01-03 (×3): qty 1

## 2014-01-03 MED ORDER — LORAZEPAM 1 MG PO TABS
1.0000 mg | ORAL_TABLET | Freq: Two times a day (BID) | ORAL | Status: DC | PRN
  Administered 2014-01-03 – 2014-01-04 (×2): 1 mg via ORAL
  Filled 2014-01-03 (×2): qty 1

## 2014-01-03 NOTE — H&P (Signed)
FMTS Attending Note  I personally saw and evaluated the patient. The plan of care was discussed with the resident team. I agree with the assessment and plan as documented by the resident.   48 year old female with past medical history of cyclic vomiting syndrome, pancreatitis, diabetes presents with nausea and vomiting, please refer to resident dictation for history present illness, patient continues to have nausea this morning which is improved from time of admission, she does report emesis overnight with some mild blood-tinged emesis, she continues to have epigastric abdominal pain which is improved with Dilaudid as needed, patient is asking for clear liquid diet this morning  Vitals: Reviewed General: Pleasant African American female, no acute distress Cardiac: Regular rate and rhythm, S1 and S2 present, no murmurs, no heaves or thrills Respiratory: Clear to auscultation bilaterally, normal effort Abdomen: Soft, epigastric tenderness, normal bowel sounds, no rebound, no guarding Extremities: 2+ radial pulses bilaterally, no lower extremity edema  Reviewed lab work from time of admission  #1. Nausea and vomiting/cyclic vomiting syndrome-patient is reporting improvement of her symptoms this morning, we'll continue antiemetics and pain medication in the short-term, her diet has been advanced to clear liquids, she does have positive Gastroccult which is likely secondary to [employer] tears from chronic emesis, no plan for GI consult at this time however if symptoms worsen could consider consultation from GI #2. AKI on CKD 3 - creatinine slightly elevated likely secondary to dehydration, will monitor after rehydration with IV fluids #3. Hand numbness, resolved, agree with magnesium level, all other electrolytes within normal limits, no other gross focal neurologic symptoms to suggest CVA #4. Diabetes-controlled on home regimen #5. Hypertension-agree with home meds and  monitor  Disposition-advance diet as tolerated  Dossie Arbour M.D.

## 2014-01-03 NOTE — Progress Notes (Signed)
Family Medicine Teaching Service Daily Progress Note Intern Pager: [phone]  Patient name: [name] [name] Medical record number: [id number] Date of birth: [birth year] Age: 48 y.o. Gender: female  Primary Care Provider: Conni Slipper, MD Consultants: None Code Status: Full  Pt Overview and Major Events to Date: 1/7: Cyclic vomiting; Chest pain  1/8: advance diet  Assessment and Plan: [name] [name] is a 48 y.o. female presenting with n/v/abdominal pain. PMH is significant for pancreatitis, diabetes, cyclic vomiting syndrome.   # N/V/abdominal pain: ddx includes cyclic vomiting vs gastritis vs PUD vs gastroenteritis vs pancreatitis. Recent ingestion of marijuana 8 days ago, likely related to sx. Tender epigastrium but no signs of acute abdomen. Hgb stable at 12 Pt afebrile with WBC normal. Has hx of pancreatitis, Lipase 77. Hx of cholecystectomy and hysterectomy. UA neg for infection  - Admit for observation to med-surg inpatient, [facility], attending Dr Gwendlyn Deutscher  - Re-emphasized possible correlation of symptoms to marijuana use  - GI cocktail  - phenergan prn and scheduled protonix 40 PO BID  - Hgb: 10.1, likely due to IVF - gastro-occult (+) / fecal occult pending - NPO with ice chips and sips with meds, advance when pain controlled w/o IV meds  - back to home PO meds and ativan  #AKI on stage 3 CKD: has both diabetic nephropathy per hx, has not yet seen renal; baseline CR 1.6-1.8. Cr 1.96 on admission. Most likely related to vomiting and prerenal etiology.  - rehydration NS ; Received 2 Liters in ED  - Cr: Improving w/ rehydration - 1.71 on 1/8  # Atypical chest pain, tachycardia: likely epigastric due to burning nature and Vomiting with some component of dehydration. Cardiac risk factors include tobacco use, HTN, and DM.  -EKG: NSR no st changes - trops Neg X2   # Hand numbness  - Intermittent right hand numbness today that has resolved; No focal  neurological signs  - Mg: wnl - Consider CT if neurological signs or symptoms return   # DM: home regimen 20u lantus qam, A1C 7.2 on 12/16/13  - lantus 10u while on clears - hypoglycemia today - easily treated with juice   # HTN: norvasc and coreg at home, elevated in the ED while in pain.  - Continue home meds w/ sips   FEN/GI: NPO except ice chips for relief of burning, NS @125   Prophylaxis: SCDs as patient with potential GI bleed, PPI as above   Disposition: Observation; Attending Gwendlyn Deutscher; Dispo pending improvement in pain and emesis  Subjective: Mild HA and stomach pain this morning, but tolerating apple juice ok.   Objective: Temp:  [98.3 F (36.8 C)-99.5 F (37.5 C)] 98.4 F (36.9 C) (01/08 0515) Pulse Rate:  [101-129] 111 (01/08 0515) Resp:  [18-30] 20 (01/08 0515) BP: (141-195)/(71-101) 167/89 mmHg (01/08 0515) SpO2:  [98 %-100 %] 100 % (01/08 0515) Weight:  [137 lb 1.6 oz (62.188 kg)-152 lb 9.6 oz (69.219 kg)] 152 lb 9.6 oz (69.219 kg) (01/07 1920) Physical Exam: General: Obese Female; NAD  HEENT: NCAT, MMM, sclera clear  Cardiovascular: tachycardic, reg rhythm, no m/r/g  Respiratory: CTAB, no wheezing  Abdomen: soft, obese, epigastric tenderness mostly, with mild tenderness throughout; no rebound or guarding  Extremities: WWP, no LE edema  Skin: no rashes or lesions  Neuro: A&O; PERRL, EOMI  Laboratory:  Recent Labs Lab 01/02/14 1500 01/03/14 0245  WBC 7.9 7.7  HGB 12.0 10.1*  HCT 35.7* 29.2*  PLT 357 252  Recent Labs Lab 01/02/14 1500 01/03/14 0245  NA 142 144  K 4.9 4.2  CL 102 108  CO2 25 23  BUN 34* 29*  CREATININE 1.96* 1.71*  CALCIUM 10.4 9.0  PROT 8.7* 7.2  BILITOT 0.3 0.3  ALKPHOS 83 70  ALT 16 15  AST 20 19  GLUCOSE 141* 102*   Lipase 77  UA 100 protein, otherwise nml  Imaging/Diagnostic Tests:  Phill Myron, MD 01/03/2014, 7:45 AM PGY-1, [place] Intern pager: [phone], text pages welcome

## 2014-01-03 NOTE — Progress Notes (Signed)
Hypoglycemic Event  CBG: 48 @0814  am  Treatment: 15 GM carbohydrate snack  Symptoms: None  Follow-up CBG: [id number] CBG Result:96  Possible Reasons for Event: Unknown  Comments/MD notified:N/A resolved    Roberts-Von[place], [name] [name]  Remember to initiate Hypoglycemia Order Set & complete

## 2014-01-03 NOTE — Progress Notes (Signed)
UR completed. Patient changed to inpatient r/t requiring IVF@ 125cc/hr, IV pain medications, and IV antiemetics.

## 2014-01-03 NOTE — Discharge Summary (Signed)
[facility] Discharge Summary  Patient name: [name] [name] Medical record number: [id number] Date of birth: Jul 22, 1966 Age: 48 y.o. Gender: female Date of Admission: 01/02/2014  Date of Discharge: 01/04/14 Admitting Physician: Andrena Mews, MD  Primary Care Provider: Conni Slipper, MD Consultants: None  Indication for Hospitalization: Cannabinoid induced hyperemesis   Discharge Diagnoses/Problem List:  1. Cannabinoid induced hyperemesis 2. AKI on CKD stage 3 3. Chest pain 4. DM2 5. HTN  Disposition: home  Discharge Condition: stable  Brief Hospital Course: [name] [name] is a 48 y.o. female who presented with n/v/abdominal pain. PMH is significant for pancreatitis, diabetes, and cyclic vomiting syndrome. Recent ingestion of marijuana 8 days ago likely related to sx. Improved with antinausea medication and slowly advancing diet. AKI improved with rehydration. Atypical chest pain and tachycardia likely due to vomiting and dehydration; Resolved with GI cocktail, phenergan and pain control. Hgb decreased to 10.1 from 12 likely due to IVF, but PUD or mallory-weise tear. Creatinine improved with IVF fluids and was 1.57 on discharge.   Issues for Follow Up:  1. Recheck Hgb; and follow-up FOBT 2. Check BMET for AKI 3. If symptoms persist consider PUD 4. Reinforce marijuana use link to cyclic vomiting 5. Discuss referral to Dr Gwenlyn Saran for Anxiety  Significant Procedures: None  Significant Labs and Imaging:   Recent Labs Lab 01/02/14 1500 01/03/14 0245 01/04/14 1015  WBC 7.9 7.7 5.9  HGB 12.0 10.1* 9.3*  HCT 35.7* 29.2* 27.0*  PLT 357 252 220    Recent Labs Lab 01/02/14 1500 01/02/14 2200 01/03/14 0245 01/04/14 1015  NA 142  --  144 137  K 4.9  --  4.2 4.8  CL 102  --  108 105  CO2 25  --  23 22  GLUCOSE 141*  --  102* 148*  BUN 34*  --  29* 15  CREATININE 1.96*  --  1.71* 1.57*  CALCIUM 10.4  --  9.0 8.5  MG  --  1.8  --   --    ALKPHOS 83  --  70  --   AST 20  --  19  --   ALT 16  --  15  --   ALBUMIN 4.4  --  3.6  --    Lipase     Component Value Date/Time   LIPASE 77* 01/02/2014 1500     Recent Labs Lab 01/02/14 2125 01/03/14 0245  TROPONINI <0.30 <0.30   Results/Tests Pending at Time of Discharge: FOBT  Discharge Medications:    Medication List         amLODipine 10 MG tablet  Commonly known as:  NORVASC  Take 1 tablet (10 mg total) by mouth daily.     carvedilol 25 MG tablet  Commonly known as:  COREG  Take 1 tablet (25 mg total) by mouth 2 (two) times daily with a meal.     diclofenac sodium 1 % Gel  Commonly known as:  VOLTAREN  Apply 2 g topically 4 (four) times daily as needed (for pain).     insulin glargine 100 UNIT/ML injection  Commonly known as:  LANTUS  Inject 0.2 mLs (20 Units total) into the skin every morning.     lisinopril 40 MG tablet  Commonly known as:  PRINIVIL,ZESTRIL  Take 1 tablet (40 mg total) by mouth every morning.     LORazepam 1 MG tablet  Commonly known as:  ATIVAN  Take 1 tablet (1 mg total) by mouth  2 (two) times daily as needed for anxiety. Fill on or after 12/28/2013.     omeprazole 20 MG capsule  Commonly known as:  PRILOSEC  Take 1 capsule (20 mg total) by mouth daily.     oxyCODONE 5 MG immediate release tablet  Commonly known as:  Oxy IR/ROXICODONE  Take 1 tablet (5 mg total) by mouth every 8 (eight) hours as needed.     SUMAtriptan 50 MG tablet  Commonly known as:  IMITREX  Take 50 mg by mouth every 2 (two) hours as needed for migraine. May repeat in 2 hours if headache persists or recurs.        Discharge Instructions: Please refer to Patient Instructions section of EMR for full details.  Patient was counseled important signs and symptoms that should prompt return to medical care, changes in medications, dietary instructions, activity restrictions, and follow up appointments.   Follow-Up Appointments: Follow-up Information   Call  Conni Slipper, MD. (If symptoms worsen as needed)    Specialty:  Family Medicine   Contact information:   [place] [address] [phone]      Phill Myron, MD 01/04/2014, 6:07 PM PGY-1, [place]

## 2014-01-03 NOTE — Discharge Instructions (Signed)
Cyclic Vomiting Syndrome Cyclic vomiting syndrome is a benign condition in which patients experience bouts or cycles of severe nausea and vomiting that last for hours or even days. The bouts of nausea and vomiting alternate with longer periods of no symptoms and generally good health. Cyclic vomiting syndrome occurs mostly in children, but can affect adults. CAUSES  [facility] has no known cause. Each episode is typically similar to the previous ones. The episodes tend to:   Start at about the same time of day.  Last the same length of time.  Present the same symptoms at the same level of intensity. Cyclic vomiting syndrome can begin at any age in children and adults. Cyclic vomiting syndrome usually starts between the ages of 3 and 7 years. In adults, episodes tend to occur less often than they do in children, but they last longer. Furthermore, the events or situations that trigger episodes in adults cannot always be pinpointed as easily as they can in children. There are 4 phases of cyclic vomiting syndrome: 1. Prodrome. The prodrome phase signals that an episode of nausea and vomiting is about to begin. This phase can last from just a few minutes to several hours. This phase is often marked by belly (abdominal) pain. Sometimes taking medicine early in the prodrome phase can stop an episode in progress. However, sometimes there is no warning. A person may simply wake up in the middle of the night or early morning and begin vomiting. 2. Episode. The episode phase consists of:  Severe vomiting.  Nausea.  Gagging (retching). 3. Recovery. The recovery phase begins when the nausea and vomiting stop. Healthy color, appetite, and energy return. 4. Symptom-free interval. The symptom-free interval phase is the period between episodes when no symptoms are present. TRIGGERS Episodes can be triggered by an infection or event. Examples of triggers include:  Marijuana use  Infections.  Colds, allergies,  sinus problems, and the flu.  Eating certain foods such as chocolate or cheese.  Foods with monosodium glutamate (MSG) or preservatives.  Fast foods.  Pre-packaged foods.  Foods with low nutritional value (junk foods).  Overeating.  Eating just before going to bed.  Hot weather.  Dehydration.  Not enough sleep or poor sleep quality.  Physical exhaustion.  Menstruation.  Motion sickness.  Emotional stress (school or home difficulties).  Excitement or stress. SYMPTOMS  The main symptoms of cyclic vomiting syndrome are:  Severe vomiting.  Nausea.  Gagging (retching). Episodes usually begin at night or the first thing in the morning. Episodes may include vomiting or retching up to 5 or 6 times an hour during the worst of the episode. Episodes usually last anywhere from 1 to 4 days. Episodes can last for up to 10 days. Other symptoms include:  Paleness.  Exhaustion.  Listlessness.  Abdominal pain.  Loose stools or diarrhea. Sometimes the nausea and vomiting are so severe that a person appears to be almost unconscious. Sensitivity to light, headache, fever, dizziness, may also accompany an episode. In addition, the vomiting may cause drooling and excessive thirst. Drinking water usually leads to more vomiting, though the water can dilute the acid in the vomit, making the episode a little less painful. Continuous vomiting can lead to dehydration, which means that the body has lost excessive water and salts. DIAGNOSIS  Cyclic vomiting syndrome is hard to diagnose because there are no clear tests to identify it. A caregiver must diagnose cyclic vomiting syndrome by looking at symptoms and medical history. A caregiver must exclude  more common diseases or disorders that can also cause nausea and vomiting. Also, diagnosis takes time because caregivers need to identify a pattern or cycle to the vomiting. TREATMENT  Cyclic vomiting syndrome cannot be cured. Treatment varies,  but people with cyclic vomiting syndrome should get plenty of rest and sleep and take medications that prevent, stop, or lessen the vomiting episodes and other symptoms. People whose episodes are frequent and long-lasting may be treated during the symptom-free intervals in an effort to prevent or ease future episodes. The symptom-free phase is a good time to eliminate anything known to trigger an episode. For example, if episodes are brought on by stress or excitement, this period is the time to find ways to reduce stress and stay calm. If sinus problems or allergies cause episodes, those conditions should be treated. The triggers listed above should be avoided or prevented. Because of the similarities between migraine and cyclic vomiting syndrome, caregivers treat some people with severe cyclic vomiting syndrome with drugs that are also used for migraine headaches. The drugs are designed to:  Prevent episodes.  Reduce their frequency.  Lessen their severity. HOME CARE INSTRUCTIONS Once a vomiting episode begins, treatment is supportive. It helps to stay in bed and sleep in a dark, quiet room. Severe nausea and vomiting may require hospitalization and intravenous (IV) fluids to prevent dehydration. Relaxing medications (sedatives) may help if the nausea continues. Sometimes, during the prodrome phase, it is possible to stop an episode from happening altogether. Only take over-the-counter or prescription medicines for pain, discomfort or fever as directed by your caregiver. Do not give aspirin to children. During the recovery phase, drinking water and replacing lost electrolytes (salts in the blood) are very important. Electrolytes are salts that the body needs to function well and stay healthy. Symptoms during the recovery phase can vary. Some people find that their appetites return to normal immediately, while others need to begin by drinking clear liquids and then move slowly to solid food. RELATED  COMPLICATIONS The severe vomiting that defines cyclic vomiting syndrome is a risk factor for several complications:  Dehydration Vomiting causes the body to lose water quickly.  Electrolyte imbalance Vomiting also causes the body to lose the important salts it needs to keep working properly.  Peptic esophagitis The tube that connects the mouth to the stomach (esophagus) becomes injured from the stomach acid that comes up with the vomit.  Hematemesis The esophagus becomes irritated and bleeds, so blood mixes with the vomit.  Mallory-Weiss tear The lower end of the esophagus may tear open or the stomach may bruise from vomiting or retching.  Tooth decay The acid in the vomit can hurt the teeth by corroding the tooth enamel. SEEK MEDICAL CARE IF: You have questions or problems. Document Released: 02/21/2002 Document Revised: 03/06/2012 Document Reviewed: 03/22/2011 [facility] Patient Information 2014 [place], [place].

## 2014-01-03 NOTE — Progress Notes (Signed)
FMTS Attending  Note: [name] Curvin,MD I  have seen and examined this patient, reviewed their chart. I have discussed this patient with the resident. I agree with the resident's findings, assessment and care plan.  

## 2014-01-03 NOTE — Progress Notes (Signed)
Notified [name], MD oncall that patient's BP is 167/89 and HR is 111. No new orders given. Will continue to monitor patient. Ranelle Oyster, RN

## 2014-01-03 NOTE — Care Management Note (Unsigned)
    Page 1 of 1   01/03/2014     3:03:37 PM   CARE MANAGEMENT NOTE 01/03/2014  Patient:  [name] [name], [name] [name]   Account Number:  [id number]  Date Initiated:  01/03/2014  Documentation initiated by:  Tomi Bamberger  Subjective/Objective Assessment:   dx cyclical vomiting  admit - lives alone.     Action/Plan:   Anticipated DC Date:  01/04/2014   Anticipated DC Plan:  [place]  CM consult      Choice offered to / List presented to:             Status of service:  In process, will continue to follow Medicare Important Message given?   (If response is "NO", the following Medicare IM given date fields will be blank) Date Medicare IM given:   Date Additional Medicare IM given:    Discharge Disposition:    Per UR Regulation:  Reviewed for med. necessity/level of care/duration of stay  If discussed at [place] of Stay Meetings, dates discussed:    Comments:  01/03/14 15:02 Tomi Bamberger RN, BSN [phone] patient was npo this am and now advanced to clears, for possible dc tomorrow.  NCM will continue to follow for dc needs.

## 2014-01-04 DIAGNOSIS — R1115 Cyclical vomiting syndrome unrelated to migraine: Secondary | ICD-10-CM | POA: Diagnosis not present

## 2014-01-04 LAB — GLUCOSE, CAPILLARY
Glucose-Capillary: 29 mg/dL — CL (ref 70–99)
Glucose-Capillary: 146 mg/dL — ABNORMAL HIGH (ref 70–99)
Glucose-Capillary: 130 mg/dL — ABNORMAL HIGH (ref 70–99)
Glucose-Capillary: 52 mg/dL — ABNORMAL LOW (ref 70–99)
Glucose-Capillary: 219 mg/dL — ABNORMAL HIGH (ref 70–99)
Glucose-Capillary: 155 mg/dL — ABNORMAL HIGH (ref 70–99)

## 2014-01-04 LAB — CBC
HCT: 27 % — ABNORMAL LOW (ref 36.0–46.0)
Hemoglobin: 9.3 g/dL — ABNORMAL LOW (ref 12.0–15.0)
MCH: 31.6 pg (ref 26.0–34.0)
MCHC: 34.4 g/dL (ref 30.0–36.0)
MCV: 91.8 fL (ref 78.0–100.0)
Platelets: 220 10*3/uL (ref 150–400)
RBC: 2.94 MIL/uL — ABNORMAL LOW (ref 3.87–5.11)
RDW: 14.1 % (ref 11.5–15.5)
WBC: 5.9 10*3/uL (ref 4.0–10.5)

## 2014-01-04 LAB — BASIC METABOLIC PANEL
BUN: 15 mg/dL (ref 6–23)
CALCIUM: 8.5 mg/dL (ref 8.4–10.5)
CO2: 22 meq/L (ref 19–32)
CREATININE: 1.57 mg/dL — AB (ref 0.50–1.10)
Chloride: 105 mEq/L (ref 96–112)
GFR calc Af Amer: 44 mL/min — ABNORMAL LOW (ref >90)
GFR calc non Af Amer: 38 mL/min — ABNORMAL LOW (ref >90)
GLUCOSE: 148 mg/dL — AB (ref 70–99)
Potassium: 4.8 mEq/L (ref 3.7–5.3)
Sodium: 137 mEq/L (ref 137–147)

## 2014-01-04 MED ORDER — GLUCOSE 40 % PO GEL
ORAL | Status: AC
  Filled 2014-01-04: qty 1

## 2014-01-04 MED ORDER — DEXTROSE 50 % IV SOLN
INTRAVENOUS | Status: AC
  Filled 2014-01-04: qty 50

## 2014-01-04 MED ORDER — HEPARIN SOD (PORK) LOCK FLUSH 100 UNIT/ML IV SOLN
500.0000 [IU] | INTRAVENOUS | Status: DC | PRN
  Administered 2014-01-04: 500 [IU]
  Filled 2014-01-04: qty 5

## 2014-01-04 MED ORDER — DEXTROSE 50 % IV SOLN
INTRAVENOUS | Status: AC
  Administered 2014-01-04: 08:00:00 25 mL
  Filled 2014-01-04: qty 50

## 2014-01-04 MED ORDER — HEPARIN SOD (PORK) LOCK FLUSH 100 UNIT/ML IV SOLN
500.0000 [IU] | INTRAVENOUS | Status: DC
  Filled 2014-01-04: qty 5

## 2014-01-04 MED ORDER — DEXTROSE 50 % IV SOLN
50.0000 mL | Freq: Once | INTRAVENOUS | Status: AC | PRN
Start: 2014-01-04 — End: 2014-01-04
  Administered 2014-01-04: 50 mL via INTRAVENOUS

## 2014-01-04 NOTE — Progress Notes (Signed)
FMTS Attending  Note: [name] Avis,MD I  have seen and examined this patient, reviewed their chart. I have discussed this patient with the resident. I agree with the resident's findings, assessment and care plan.  

## 2014-01-04 NOTE — Progress Notes (Signed)
Notified Clearance Coots, MD that patient requesting saltine crackers. Patient has not had any vomiting/nausea or pain meds since dayshift. MD aware that pt is not nauseous. [name], MD told nurse to give prn phenergan po anyway prior to giving saltines. MD stated that he was not advancing patient's diet at this time, just to give a few saltines to patient. Ranelle Oyster, RN

## 2014-01-04 NOTE — Progress Notes (Signed)
NURSING PROGRESS NOTE  [name] [name] [id number] Discharge Data: 01/04/2014 6:40 PM Attending Provider: Andrena Mews, MD [id number], Verdis Frederickson, MD     [name] to be D/C'd Home per MD order.  Discussed with the patient the After Visit Summary and all questions fully answered. All IV's discontinued with no bleeding noted. All belongings returned to patient for patient to take home.   Last Vital Signs:  Blood pressure 143/51, pulse 71, temperature 97.9 F (36.6 C), temperature source Oral, resp. rate 20, height 5\' 1"  (1.549 m), weight 69.219 kg (152 lb 9.6 oz), SpO2 100.00%.  Discharge Medication List   Medication List         amLODipine 10 MG tablet  Commonly known as:  NORVASC  Take 1 tablet (10 mg total) by mouth daily.     carvedilol 25 MG tablet  Commonly known as:  COREG  Take 1 tablet (25 mg total) by mouth 2 (two) times daily with a meal.     diclofenac sodium 1 % Gel  Commonly known as:  VOLTAREN  Apply 2 g topically 4 (four) times daily as needed (for pain).     insulin glargine 100 UNIT/ML injection  Commonly known as:  LANTUS  Inject 0.2 mLs (20 Units total) into the skin every morning.     lisinopril 40 MG tablet  Commonly known as:  PRINIVIL,ZESTRIL  Take 1 tablet (40 mg total) by mouth every morning.     LORazepam 1 MG tablet  Commonly known as:  ATIVAN  Take 1 tablet (1 mg total) by mouth 2 (two) times daily as needed for anxiety. Fill on or after 12/28/2013.     omeprazole 20 MG capsule  Commonly known as:  PRILOSEC  Take 1 capsule (20 mg total) by mouth daily.     oxyCODONE 5 MG immediate release tablet  Commonly known as:  Oxy IR/ROXICODONE  Take 1 tablet (5 mg total) by mouth every 8 (eight) hours as needed.     SUMAtriptan 50 MG tablet  Commonly known as:  IMITREX  Take 50 mg by mouth every 2 (two) hours as needed for migraine. May repeat in 2 hours if headache persists or recurs.

## 2014-01-04 NOTE — Progress Notes (Signed)
Hypoglycemic Event  CBG: 52  Treatment: 15 GM carbohydrate snack and D50 IV 25 mL  Symptoms: None  Follow-up CBG: Time: [id number] CBG Result: 130  Possible Reasons for Event: Unknown  Comments/MD notified: Dr. Charm Barges, Lattie Haw  Remember to initiate Hypoglycemia Order Set & complete

## 2014-01-04 NOTE — Progress Notes (Signed)
Hypoglycemic Event  CBG: 29 at 0126  Treatment: D50 IV 50 mL  Symptoms: Sweaty and Shaky  Follow-up CBG: Time:0146 CBG Result:146  Possible Reasons for Event: Unknown  Comments/MD notified: Notified Clearance Coots, MD, no new orders given. Will continue to monitor patient.     Candace Gallus  Remember to initiate Hypoglycemia Order Set & complete

## 2014-01-04 NOTE — Progress Notes (Signed)
Family Medicine Teaching Service Daily Progress Note Intern Pager: [phone]  Patient name: [name] [name] Medical record number: [id number] Date of birth: 02-Nov-1966 Age: 48 y.o. Gender: female  Primary Care Provider: Conni Slipper, MD Consultants: None Code Status: Full  Pt Overview and Major Events to Date: 1/7: Cyclic vomiting; Chest pain  1/8: advance diet  Assessment and Plan: [name] [name] is a 48 y.o. female presenting with n/v/abdominal pain. PMH is significant for pancreatitis, diabetes, cyclic vomiting syndrome.   # N/V/abdominal pain: ddx includes cyclic vomiting vs gastritis vs PUD vs gastroenteritis vs pancreatitis. Recent ingestion of marijuana 8 days ago, likely related to sx. Tender epigastrium but no signs of acute abdomen. Hgb stable at 12 Pt afebrile with WBC normal. Has hx of pancreatitis, Lipase 77. Hx of cholecystectomy and hysterectomy. UA neg for infection  - Admit for observation to med-surg inpatient, [facility], attending Dr Gwendlyn Deutscher  - Re-emphasized possible correlation of symptoms to marijuana use  - GI cocktail  - phenergan prn and scheduled protonix 40 PO BID  - Hgb: 10.1, likely due to IVF; check cbc - gastro-occult (+) / fecal occult pending - back on home PO meds and ativan  #AKI on stage 3 CKD: has both diabetic nephropathy per hx, has not yet seen renal; baseline CR 1.6-1.8. Cr 1.96 on admission. Most likely related to vomiting and prerenal etiology.  - rehydration NS ; Received 2 Liters in ED  - Cr: Improving w/ rehydration - 1.71 on 1/8 - Bmet today  # Atypical chest pain, tachycardia: likely epigastric due to burning nature and Vomiting with some component of dehydration. Cardiac risk factors include tobacco use, HTN, and DM.  -EKG: NSR no st changes - trops Neg X2   # Hand numbness  - Intermittent right hand numbness today that has resolved; No focal neurological signs  - Mg: wnl - Consider CT if neurological  signs or symptoms return   # DM: home regimen 20u lantus qam, A1C 7.2 on 12/16/13  - Holding lantus; due to hypoglycemia today/yesterday - easily treated with juice   # HTN: norvasc and coreg at home, elevated in the ED while in pain.  - Continue home meds w/ sips   FEN/GI: advance diet as tolerate, NS @ 50  Prophylaxis: SCDs as patient with potential GI bleed, PPI as above   Disposition: Observation; Attending Gwendlyn Deutscher; Dispo pending improvement in pain and emesis  Subjective: Stomach pain improving this morning, tolerating clear liquids well.   Objective: Temp:  [97.5 F (36.4 C)-98.8 F (37.1 C)] 98.6 F (37 C) (01/09 0704) Pulse Rate:  [57-67] 67 (01/09 0704) Resp:  [20] 20 (01/09 0704) BP: (105-157)/(59-84) 157/84 mmHg (01/09 0704) SpO2:  [99 %-100 %] 100 % (01/09 0704) Physical Exam: General: Obese Female; NAD  HEENT: NCAT, MMM, sclera clear  Cardiovascular: tachycardic, reg rhythm, no m/r/g  Respiratory: CTAB, no wheezing  Abdomen: soft, obese, epigastric tenderness mostly, with mild tenderness throughout; no rebound or guarding  Extremities: WWP, no LE edema  Skin: no rashes or lesions  Neuro: A&O; PERRL, EOMI  Laboratory:  Recent Labs Lab 01/02/14 1500 01/03/14 0245  WBC 7.9 7.7  HGB 12.0 10.1*  HCT 35.7* 29.2*  PLT 357 252    Recent Labs Lab 01/02/14 1500 01/03/14 0245  NA 142 144  K 4.9 4.2  CL 102 108  CO2 25 23  BUN 34* 29*  CREATININE 1.96* 1.71*  CALCIUM 10.4 9.0  PROT 8.7* 7.2  BILITOT  0.3 0.3  ALKPHOS 83 70  ALT 16 15  AST 20 19  GLUCOSE 141* 102*   Lipase 77  UA 100 protein, otherwise nml  Imaging/Diagnostic Tests:  Phill Myron, MD 01/04/2014, 8:30 AM PGY-1, [place] Intern pager: [phone], text pages welcome

## 2014-01-04 NOTE — Progress Notes (Signed)
Inpatient Diabetes Program Recommendations  AACE/ADA: New Consensus Statement on Inpatient Glycemic Control (2013)  Target Ranges:  Prepandial:   less than 140 mg/dL      Peak postprandial:   less than 180 mg/dL (1-2 hours)      Critically ill patients:  140 - 180 mg/dL   Reason for Visit: Results for LIESE, HILLING (MRN [id number]) as of 01/04/2014 14:57  Ref. Range 01/04/2014 01:46 01/04/2014 07:47 01/04/2014 08:22 01/04/2014 10:15 01/04/2014 12:22  Glucose-Capillary Latest Range: 70-99 mg/dL 146 (H) 52 (L) 130 (H)  219 (H)   Note low CBG's.  Patient received Lantus 10 units on 01/03/14 (which is 1/2 of home dose).  Agree with discontinuation of Lantus today.  May need to resume sensitive Novolog correction tid with meals. Once intake improved, may need basal insulin resumed.  Thanks, Adah Perl, RN, BC-ADM Inpatient Diabetes Coordinator Pager [phone]

## 2014-01-05 NOTE — Discharge Summary (Signed)
FMTS Attending Note: [name] Hausmann,MD I  have seen and examined this patient, reviewed their chart. I have discussed this patient with the resident. I agree with the resident's findings, assessment and care plan.  

## 2014-01-22 ENCOUNTER — Encounter: Payer: Self-pay | Admitting: Family Medicine

## 2014-01-22 ENCOUNTER — Ambulatory Visit (INDEPENDENT_AMBULATORY_CARE_PROVIDER_SITE_OTHER): Payer: Medicare Other | Admitting: Family Medicine

## 2014-01-22 VITALS — BP 157/77 | HR 71 | Temp 98.3°F | Ht 61.0 in | Wt 160.0 lb

## 2014-01-22 DIAGNOSIS — F1210 Cannabis abuse, uncomplicated: Secondary | ICD-10-CM

## 2014-01-22 DIAGNOSIS — D649 Anemia, unspecified: Secondary | ICD-10-CM | POA: Diagnosis not present

## 2014-01-22 DIAGNOSIS — R1115 Cyclical vomiting syndrome unrelated to migraine: Secondary | ICD-10-CM

## 2014-01-22 DIAGNOSIS — N189 Chronic kidney disease, unspecified: Secondary | ICD-10-CM | POA: Diagnosis not present

## 2014-01-22 DIAGNOSIS — M549 Dorsalgia, unspecified: Secondary | ICD-10-CM

## 2014-01-22 DIAGNOSIS — I10 Essential (primary) hypertension: Secondary | ICD-10-CM | POA: Diagnosis not present

## 2014-01-22 DIAGNOSIS — F17200 Nicotine dependence, unspecified, uncomplicated: Secondary | ICD-10-CM

## 2014-01-22 DIAGNOSIS — F1290 Cannabis use, unspecified, uncomplicated: Secondary | ICD-10-CM

## 2014-01-22 LAB — CBC
HEMATOCRIT: 32.2 % — AB (ref 36.0–46.0)
HEMOGLOBIN: 10.7 g/dL — AB (ref 12.0–15.0)
MCH: 30.9 pg (ref 26.0–34.0)
MCHC: 33.2 g/dL (ref 30.0–36.0)
MCV: 93.1 fL (ref 78.0–100.0)
Platelets: 265 10*3/uL (ref 150–400)
RBC: 3.46 MIL/uL — ABNORMAL LOW (ref 3.87–5.11)
RDW: 15.8 % — ABNORMAL HIGH (ref 11.5–15.5)
WBC: 6.5 10*3/uL (ref 4.0–10.5)

## 2014-01-22 MED ORDER — HYDROCHLOROTHIAZIDE 25 MG PO TABS: 25.0000 mg | ORAL_TABLET | Freq: Every day | ORAL | Status: DC

## 2014-01-22 NOTE — Patient Instructions (Addendum)
Good to see you.  For your vomiting, we are checking labs today. I will call you if any are NOT normal.  For your anemia, we are checking labs. Your rectal exam had no blood. I possibly felt small internal hemorrhoids.  I really think that this can both cause anxiety and anxiety certainly does not make it better. I applaud you for trying to quit smoking marijuana and cigarettes. I think meeting with Dr Gwenlyn Saran can only help support you in this. She and I can also help decide with you if starting a longer-term medication may help and pick the one with least likelihood to cause stomach upset. Please think about calling her to set up an appointment.  For your blood pressure, I would like to start HCTZ and follow-up in 1-2 weeks to evaluate how your BP is doing. If you can check it at home, great. Bring your cuff in to the next visit so we can look at it together.

## 2014-01-22 NOTE — Progress Notes (Signed)
Patient ID: [name] [name], female   DOB: 07-26-1966, 48 y.o.   MRN: [id number] Subjective:   CC: Hospital follow-up  HPI:   Hospital follow-up: Patient was hospitalized [id number] for cyclic vomiting. Symptoms resolved and intermittently came back last week with diarrhea and nausea, which she was able to manage at home. Three days ago, nausea and emesis returned but resolved with ativan, phenergan, and sleep. She held off on taking oxycodone because she was home alone and nervous. Symptoms are resolved now. She denies weakness, fainting, dizziness, and dyspnea. At the hospital, she was anemic and today we are to check Hgb and FOBT. She denies bloody stools or dark stools. She also had AKI and we are to check BMET. On discussion of anxiety's role, patient agreed it likely plays a role but is not interested in stopping benzo since it works. She reports having tried antidepressants in the past (mentions mirtazepine >10 years ago) and they did not work; she also worries about effect on her GI tract. She is not excited about the option of meeting with our psychologist because she has trouble opening up to people.  Pain: Patient reports continued daily back, buttock, and right hip pain. She has about 1 week of pain medication left. She is able to ambulate and does not report bowel/bladder symptoms.  CKD: At previous visit, referred patient to nephrologist. She has an appointment with them 1/30 but knows she will need to move it.   BP: Poorly controlled, had started norvasc 5mg  daily which patient thinks "tore up my stomach" so stopped taking this 1/10. Takes coreg and lisinopril. Reports cannot check home BP due to not knowing how to work manual cuff and that she cannot check BP at drug store due to no car. Does not report chest pain, dizziness, fainting, or dyspnea.   Review of Systems - Per HPI. Additionally, pt reports right hip, buttock, back pain. She will be running out of her pain medication in about 1  week.   SH: Trying to quit smoking cigarettes and marijuana. Last used marijuana 3 days ago. Quit alcohol years ago. Has boyfriend, reports not opening up much to him. Reports high level of stress.  Objective:  Physical Exam BP 157/77  Pulse 71  Temp(Src) 98.3 F (36.8 C) (Oral)  Ht 5\' 1"  (1.549 m)  Wt 160 lb (72.576 kg)  BMI 30.25 kg/m2 GEN: NAD, tired-appearing chronically HEENT: Atraumatic, normocephalic, neck supple, EOMI, sclera clear  PULM: normal effort ABD: Soft, nontender, nondistended SKIN: No rash or cyanosis; warm and well-perfused PSYCH: Mood and affect mildly low, normal rate and volume of speech NEURO: Awake, alert, no focal deficits grossly, normal speech and gait RECTAL: Possible small internal hemorrhoid, no blood seen grossly, FOBT neg.  Assessment:     [name] [name] is a 48 y.o. female here for hospital follow-up.    Plan:     # See problem list and after visit summary for problem-specific plans.  # Health Maintenance: Not discussed.  Follow-up: Follow up in 1 week for f/u of pain and BP.    Hilton Sinclair, MD [place]

## 2014-01-23 LAB — BASIC METABOLIC PANEL
BUN: 39 mg/dL — ABNORMAL HIGH (ref 6–23)
CHLORIDE: 109 meq/L (ref 96–112)
CO2: 23 mEq/L (ref 19–32)
Calcium: 10 mg/dL (ref 8.4–10.5)
Creat: 2.23 mg/dL — ABNORMAL HIGH (ref 0.50–1.10)
GLUCOSE: 64 mg/dL — AB (ref 70–99)
Potassium: 5.1 mEq/L (ref 3.5–5.3)
Sodium: 138 mEq/L (ref 135–145)

## 2014-01-23 NOTE — Assessment & Plan Note (Signed)
-   Reports she is trying to quit.

## 2014-01-23 NOTE — Assessment & Plan Note (Signed)
No red flag symptoms brought up by pt.  - F/u in 1 week when pt runs out of oxycodone. - Find out if pain contract. - Discussed that goal is not pain-free but pain controlled and functional.

## 2014-01-23 NOTE — Assessment & Plan Note (Signed)
FOBT checked and negative. However, possibly small internal hemorrhoid on exam. MCV not low previously. Possibly anemia of chronic disease or of kidney disease. - Check CBC today. - Consider colonoscopy.

## 2014-01-23 NOTE — Assessment & Plan Note (Signed)
Hospitalized 1/7-1/9 for this, intermittent symptoms since discharge, currently asymptomatic. - BMET today to f/u creatinine. - Applauded for trying to quit marijuana; support in any way I can. - Anxiety likely plays a role. Gave pt Dr Tod Persia card. She is hesitant for now but asked her to think about it.  - Hesitant to start antidepressant as reportedly they did not work in the past (mirtazepine per pt). Rediscuss at f/u. - Consider if PUD may play a role. Pt stopped taking omeprazole.

## 2014-01-23 NOTE — Assessment & Plan Note (Signed)
BP elevated today. Stopped amlodipine 1/10 due to side effects of stomach upset. - Start HCTZ 25mg  daily. - Checking BMET today. - F/u 2 weeks to recheck BP and BMET. - Bring cuff to f/u so we can teach you how to use it. In the meantime, check at [facility] if someone can get you there. - At f/u, discuss meeting with Dr [name] for HTN teaching and help with management options.

## 2014-01-23 NOTE — Assessment & Plan Note (Signed)
-   Reports she is trying to quit. Last used 3 days ago.

## 2014-01-24 ENCOUNTER — Telehealth: Payer: Self-pay | Admitting: Family Medicine

## 2014-01-24 DIAGNOSIS — D649 Anemia, unspecified: Secondary | ICD-10-CM

## 2014-01-24 DIAGNOSIS — N189 Chronic kidney disease, unspecified: Secondary | ICD-10-CM

## 2014-01-24 NOTE — Telephone Encounter (Addendum)
Also, how do I send her CBC and BMET to [place] (Dr Pearson Grippe), where she was referred?

## 2014-01-24 NOTE — Telephone Encounter (Signed)
Attempted to call listed number with results, but received message that patieint is "unvailable right now. Please try again later." Please call patient tomorrow to communicate the following:  Anemia: Hemoglobin is improved but still low. Blood cells are normal size. I would like her to come in for iron studies (iron and ferritin) to give me more information. I would also like to know if her periods are regular and how heavy they are.  Kidney disease: Her creatinine has worsened to 2.2 from 1.5. This could be a combination of her not drinking well/vomiting when sick and being on an ace inhibitor. I would like her to stop her lisinopril for a few days and stay very hydrated. I want to recheck her creatinine in 4-5 days. I also want to know if she is taking any NSAIDs.   Thank you. Please let me know if any questions arise. I have ordered future labs and patient needs to make lab-only appointment in 4-5 days.  Hilton Sinclair, MD

## 2014-01-25 NOTE — Telephone Encounter (Signed)
Same response x 2.  Pt with appt 01/29/2014.  [place], [name] [name]

## 2014-01-30 ENCOUNTER — Ambulatory Visit (INDEPENDENT_AMBULATORY_CARE_PROVIDER_SITE_OTHER): Payer: Medicare Other | Admitting: Family Medicine

## 2014-01-30 ENCOUNTER — Encounter: Payer: Self-pay | Admitting: Family Medicine

## 2014-01-30 VITALS — BP 145/61 | HR 64 | Temp 98.9°F | Ht 61.0 in | Wt 159.0 lb

## 2014-01-30 DIAGNOSIS — F17200 Nicotine dependence, unspecified, uncomplicated: Secondary | ICD-10-CM

## 2014-01-30 DIAGNOSIS — F1290 Cannabis use, unspecified, uncomplicated: Secondary | ICD-10-CM

## 2014-01-30 DIAGNOSIS — D649 Anemia, unspecified: Secondary | ICD-10-CM | POA: Diagnosis not present

## 2014-01-30 DIAGNOSIS — N189 Chronic kidney disease, unspecified: Secondary | ICD-10-CM

## 2014-01-30 DIAGNOSIS — M549 Dorsalgia, unspecified: Secondary | ICD-10-CM

## 2014-01-30 DIAGNOSIS — F339 Major depressive disorder, recurrent, unspecified: Secondary | ICD-10-CM

## 2014-01-30 DIAGNOSIS — I10 Essential (primary) hypertension: Secondary | ICD-10-CM

## 2014-01-30 DIAGNOSIS — N179 Acute kidney failure, unspecified: Secondary | ICD-10-CM

## 2014-01-30 DIAGNOSIS — F1210 Cannabis abuse, uncomplicated: Secondary | ICD-10-CM

## 2014-01-30 MED ORDER — OXYCODONE HCL 5 MG PO TABS: 5.0000 mg | ORAL_TABLET | Freq: Every day | ORAL | Status: DC | PRN

## 2014-01-30 MED ORDER — DICLOFENAC SODIUM 1 % TD GEL: 2.0000 g | Freq: Four times a day (QID) | TRANSDERMAL | Status: DC | PRN

## 2014-01-30 MED ORDER — LORAZEPAM 1 MG PO TABS: 1.0000 mg | ORAL_TABLET | Freq: Two times a day (BID) | ORAL | Status: DC | PRN

## 2014-01-30 NOTE — Progress Notes (Signed)
Patient ID: [name] [name], female   DOB: 1966/09/02, 48 y.o.   MRN: [id number] Subjective:   CC: Follow-up BP and abnormal BMET  HPI:   HTN: At last visit, we started patient on HCTZ but she has not picked this up yet. She does not report chest pain, dizziness, shortness of breath, or swelling. She plans to pick up HCTZ today.   Abnormal labs:  - BMET last visit had creatinine bump. Was unable to reach patient by phone to communicate this to her or ask her to stop lisinopril. Reports having good hydration since that visit. - Anemia was mildly improved but still present. MCV normal. Patient has reported chronic fatigue. Plan to check iron and ferritin today.  Pain: Patient continues to report 8/10 chronic back pain. She thinks that stress and anxiety definitely feed into her symptoms. She has used oxycodone 30 tabs in about 1.5 months and ativan 60 tabs in about 1 month for pain and anxiety. She reports ativan helps with her anxiety and her nausea.   Stress/anxiety: Not working, with increasing bills, is becoming stressful.  She is unable to share this stress with many people, including her boyfriend. She feels if she opens up and becomes tearful, people will judge her.  Smoking cessation: Patient still smoking cigarettes and weed. She is interested in quitting but does not feel empowered. She smokes less than she used to, though. Motivation: 5/10, stress keeps it from being higher, and she feels it improves her poor appetite and controls her pain some. Not wanting grandkids to see her smoke keeps motivation score from being lower. Confidence: "I do not feel confident."  She is a believer, but she just feels too stressed.  SH: Patient is on disability but wants to work.  Still smoking cigarettes and marijuana. Still wants to quit but not ready. Used to drink alcohol daily, quit 5 years ago.   Review of Systems - Per HPI.      Objective:  Physical Exam BP 145/61  Pulse 64   Temp(Src) 98.9 F (37.2 C) (Oral)  Ht 5\' 1"  (1.549 m)  Wt 159 lb (72.122 kg)  BMI 30.06 kg/m2 GEN: NAD HEENT: Atraumatic, normocephalic, neck supple, EOMI, sclera clear  PULM: normal effort SKIN: No rash or cyanosis; warm and well-perfused PSYCH: Mood and affect mildly down, occasionally tearful when discussing stress; normal rate and volume of speech NEURO: Awake, alert, no focal deficits grossly, normal speech and gait  Assessment:     [name] [name] is a 48 y.o. female here for BP follow-up, anemia, and f/u of elevated creatinine. She also has chronic pain and depression/anxiety issues.    Plan:     # See problem list and after visit summary for problem-specific plans.   # Health Maintenance: Not discussed.  Follow-up: Follow up in 1 month for re-evaluation of anxiety and BP, or sooner PRN. We have been unable to f/u her diabetes recently due to recent other problems. Will need to discuss at future visit.    Hilton Sinclair, MD [place]

## 2014-01-30 NOTE — Patient Instructions (Signed)
It was great to see you.  We are refilling ativan for 1 month. Come back to see me in 1 month. We will eventually cut back on this slowly. I want you to contact Dr Gwenlyn Saran for an appointment.   We are refilling oxycodone. 30 tablets should last ~1.5 months or longer. I have also refilled voltaren gel.  We are getting labs today. Go ahead and pick up HCTZ blood pressure medicine. I will call if labs are NOT normal. If your kidney doctor needs labs, or you want to know the numbers, feel free to call us.   Hilton Sinclair, MD

## 2014-01-31 ENCOUNTER — Telehealth: Payer: Self-pay | Admitting: Family Medicine

## 2014-01-31 LAB — BASIC METABOLIC PANEL
BUN: 25 mg/dL — AB (ref 6–23)
CALCIUM: 9.4 mg/dL (ref 8.4–10.5)
CO2: 25 mEq/L (ref 19–32)
Chloride: 107 mEq/L (ref 96–112)
Creat: 1.78 mg/dL — ABNORMAL HIGH (ref 0.50–1.10)
GLUCOSE: 64 mg/dL — AB (ref 70–99)
Potassium: 5 mEq/L (ref 3.5–5.3)
SODIUM: 140 meq/L (ref 135–145)

## 2014-01-31 LAB — FERRITIN: FERRITIN: 39 ng/mL (ref 10–291)

## 2014-01-31 LAB — IRON: Iron: 78 ug/dL (ref 42–145)

## 2014-01-31 NOTE — Telephone Encounter (Signed)
Please let [name] [name] know her creatinine is back to her baseline and her iron studies were normal. She said we could call [phone] or [phone].  Ask if she would like [place] to send her labs to her kidney doctor's office, and please send them if she would.   Also ask her to continue staying hydrated and eating 3 meals daily, as her blood sugar was mildly low the last 2 visits but I think it is because she did not eat breakfast.  Hilton Sinclair, MD

## 2014-01-31 NOTE — Assessment & Plan Note (Signed)
See a/p for Tobacco Dependence.

## 2014-01-31 NOTE — Assessment & Plan Note (Signed)
Hgb mildly low, MCV normal, iron studies today (iron and ferritin) normal as well. Likely anemia of chronic kidney disease.  - F/u with renal for further recs.

## 2014-01-31 NOTE — Assessment & Plan Note (Signed)
8/10 back pain in pt with normal gait and neuro function. Back pain is chronic per prior notes.  - Has been using oxycodone 30 tabs lasting 1.5 months most recently, refilled with 30 tabs x 1 today.  - Understands this is not a long-term option and we discussed steady dose tapering starting with future visits.  - Refilled voltaren gel. Prescribed 3x weekly massage from boyfriend and warm baths.  - Understands anxiety IS playing a role but currently still not amenable to ssri or counseling. Encouraged her to reconsider and gave Dr Tod Persia information.  - Pain contract and UDS at f/u if not done.

## 2014-01-31 NOTE — Assessment & Plan Note (Addendum)
BP still elevated for age. Pt has not started HCTZ. Stopped norvasc in January due to reported side effects. Takes lisinopril.  - Pick up HCTZ today.  - Continue lisinopril and coreg. - F/u in 1 month. - Continue to work on smoking cessation.

## 2014-01-31 NOTE — Telephone Encounter (Signed)
Pt informed and notes faxed to [place]. Tyshawn Keel, [name]

## 2014-01-31 NOTE — Assessment & Plan Note (Signed)
Creatinine elevation improved down to her baseline, with recheck today 1.7.  - Pt has appt with [facility].  - Will send them her recent labwork. Ask nursing - may need ROI.  - Stay hydrated.  - Wwork on controlling BP (Continue lisinopril for now, starting HCTZ).

## 2014-01-31 NOTE — Assessment & Plan Note (Signed)
Pt is contemplative and actually reports trying to quit cigarettes and marijuana but feeling somewhat hopeless. - Encouraged. - If patient would like, we could refer to Dr [name]. Discuss at f/u. - Barriers appear to be feeling of helplessness, anxiety, depressive symptoms -  Recommended Dr Gwenlyn Saran for psychology and starting antidepressant, but pt resisting for now.

## 2014-01-31 NOTE — Assessment & Plan Note (Signed)
Patient's depressive mood and stress seems to be related to anxiety as well as social situation. She does not want to discuss this much with outside parties but discusses with me this visit and last. I worry she is becoming / has become dependent on ativan for treatment. Likely contributes to cyclic vomiting and poor coping. - Encouraged consideration of SSRI and counseling. Discussed that ativan is not a long-term option and discussed steady dose tapering starting with future visits.  - Provided Dr Tod Persia number a second time. Pt seems to be only slightly willing to consider this.  - Ativan 60 tabs lasted her 1 month. Rx again for 1 month.  - Benzo contract at f/u if not done. - Not working is contributing, and she wants to work. Provided note.

## 2014-02-06 DIAGNOSIS — A599 Trichomoniasis, unspecified: Secondary | ICD-10-CM | POA: Diagnosis not present

## 2014-02-06 DIAGNOSIS — E119 Type 2 diabetes mellitus without complications: Secondary | ICD-10-CM | POA: Diagnosis not present

## 2014-02-06 DIAGNOSIS — N183 Chronic kidney disease, stage 3 unspecified: Secondary | ICD-10-CM | POA: Diagnosis not present

## 2014-02-06 DIAGNOSIS — R809 Proteinuria, unspecified: Secondary | ICD-10-CM | POA: Diagnosis not present

## 2014-02-06 DIAGNOSIS — I10 Essential (primary) hypertension: Secondary | ICD-10-CM | POA: Diagnosis not present

## 2014-02-15 ENCOUNTER — Encounter: Payer: Self-pay | Admitting: Family Medicine

## 2014-03-06 ENCOUNTER — Ambulatory Visit: Payer: Medicare Other | Admitting: Family Medicine

## 2014-03-06 ENCOUNTER — Encounter: Payer: Self-pay | Admitting: Family Medicine

## 2014-03-06 ENCOUNTER — Ambulatory Visit (INDEPENDENT_AMBULATORY_CARE_PROVIDER_SITE_OTHER): Payer: Medicare Other | Admitting: Family Medicine

## 2014-03-06 VITALS — BP 147/82 | HR 61 | Temp 98.0°F | Wt 165.0 lb

## 2014-03-06 DIAGNOSIS — R1115 Cyclical vomiting syndrome unrelated to migraine: Secondary | ICD-10-CM

## 2014-03-06 MED ORDER — LORAZEPAM 1 MG PO TABS: 1.0000 mg | ORAL_TABLET | Freq: Two times a day (BID) | ORAL | Status: DC | PRN

## 2014-03-06 MED ORDER — PROMETHAZINE HCL 25 MG PO TABS: 25.0000 mg | ORAL_TABLET | Freq: Three times a day (TID) | ORAL | Status: DC | PRN

## 2014-03-06 MED ORDER — FAMOTIDINE 40 MG PO TABS: 40.0000 mg | ORAL_TABLET | Freq: Every day | ORAL | Status: DC

## 2014-03-06 MED ORDER — PROMETHAZINE HCL 25 MG/ML IJ SOLN
25.0000 mg | Freq: Once | INTRAMUSCULAR | Status: AC
  Administered 2014-03-06: 25 mg via INTRAMUSCULAR

## 2014-03-06 MED ORDER — OXYCODONE HCL 5 MG PO TABS: 5.0000 mg | ORAL_TABLET | Freq: Every day | ORAL | Status: DC | PRN

## 2014-03-06 NOTE — Patient Instructions (Signed)
Good to see you today!  Thanks for coming in.  I sent in Rx for the phenergan and Famotidine (try to see if helps)  I gave refills on your lorazepam and oxycodone  See Dr T in 1-2 weeks

## 2014-03-06 NOTE — Progress Notes (Signed)
   Subjective:    Patient ID: [name] [name], female    DOB: [birth year], 48 y.o.   MRN: [id number]  HPI  Abdomen Pain and Vomiting Started las PM first with loose stool the abdomen pain and with vomiting x 1 this AM.  No bleeding rash, shortness of breath, or fever.  No sick contacts  Very similar to previous episodes.  Is out of ativan and oxycodone and phernegan.  Taking her blood pressure medications regularly Not taking Omeprazole because it burns .   Review of Symptoms - see HPI  PMH - Smoking status noted.   Long history of cyclic vomiting, with occasional pancreatitis   Review of Systems     Objective:   Physical Exam Alert mild distress  No vomiting in room Vs noted No cvat Abdomen: soft and tender over epigastric areas without masses, organomegaly or hernias noted.  No guarding or rebound Skin:  Intact without suspicious lesions or rashes MM moist         Assessment & Plan:

## 2014-03-06 NOTE — Assessment & Plan Note (Signed)
Seems to be an exacerbation of this or perhaps gastroenteritis given the diarrhea.   No signs of acute abdomen or focal bacterial infection.  Doubt pancreatitis but could be early would check labs if does not resolve. Give her usual medications and trial of famotidine instead of PPI.

## 2014-03-06 NOTE — Addendum Note (Signed)
Addended by: Christen Bame D on: 03/06/2014 12:33 PM   Modules accepted: Orders

## 2014-04-03 ENCOUNTER — Telehealth: Payer: Self-pay | Admitting: Family Medicine

## 2014-04-03 MED ORDER — LISINOPRIL 40 MG PO TABS: 40.0000 mg | ORAL_TABLET | Freq: Every morning | ORAL | Status: DC

## 2014-04-03 NOTE — Telephone Encounter (Signed)
Received [employer] fax stating that patient's lisinopril 40mg  could be covered for 90 day supply which would be equal or less cost than 30 day supply. Message also indicated patient's last fill was in 01/01/14, suggesting she may be having hard time with compliance. Will approve 90 day supply. Sending new rx to [facility] on [address]. Please call and let pt know.  Thanks.  Hilton Sinclair, MD

## 2014-04-03 NOTE — Telephone Encounter (Signed)
LM for patient to call back.  Please inform of message below that rx was called into pharmacy.  Thanks [employer]

## 2014-04-04 ENCOUNTER — Encounter: Payer: Self-pay | Admitting: Family Medicine

## 2014-04-04 ENCOUNTER — Ambulatory Visit (INDEPENDENT_AMBULATORY_CARE_PROVIDER_SITE_OTHER): Payer: Medicare Other | Admitting: Family Medicine

## 2014-04-04 VITALS — BP 120/47 | HR 70 | Temp 99.4°F | Ht 61.0 in | Wt 161.0 lb

## 2014-04-04 DIAGNOSIS — M719 Bursopathy, unspecified: Secondary | ICD-10-CM

## 2014-04-04 DIAGNOSIS — M25519 Pain in unspecified shoulder: Secondary | ICD-10-CM

## 2014-04-04 DIAGNOSIS — N183 Chronic kidney disease, stage 3 unspecified: Secondary | ICD-10-CM | POA: Diagnosis not present

## 2014-04-04 DIAGNOSIS — E1365 Other specified diabetes mellitus with hyperglycemia: Secondary | ICD-10-CM

## 2014-04-04 DIAGNOSIS — IMO0002 Reserved for concepts with insufficient information to code with codable children: Secondary | ICD-10-CM

## 2014-04-04 DIAGNOSIS — M6790 Unspecified disorder of synovium and tendon, unspecified site: Secondary | ICD-10-CM

## 2014-04-04 DIAGNOSIS — F411 Generalized anxiety disorder: Secondary | ICD-10-CM | POA: Diagnosis not present

## 2014-04-04 DIAGNOSIS — E1329 Other specified diabetes mellitus with other diabetic kidney complication: Secondary | ICD-10-CM | POA: Diagnosis not present

## 2014-04-04 DIAGNOSIS — M67921 Unspecified disorder of synovium and tendon, right upper arm: Secondary | ICD-10-CM

## 2014-04-04 DIAGNOSIS — E1322 Other specified diabetes mellitus with diabetic chronic kidney disease: Secondary | ICD-10-CM

## 2014-04-04 LAB — POCT GLYCOSYLATED HEMOGLOBIN (HGB A1C): Hemoglobin A1C: 9.4

## 2014-04-04 MED ORDER — LORAZEPAM 1 MG PO TABS: 1.0000 mg | ORAL_TABLET | Freq: Two times a day (BID) | ORAL | Status: DC | PRN

## 2014-04-04 MED ORDER — OXYCODONE HCL 5 MG PO TABS: 5.0000 mg | ORAL_TABLET | Freq: Every day | ORAL | Status: DC | PRN

## 2014-04-04 NOTE — Progress Notes (Signed)
Patient ID: [name], female   DOB: [birth year], 48 y.o.   MRN: [id number] Subjective:   CC: Right shoulder pain  HPI:   Right shoulder pain Patient reports pain started 2 years ago when playing wi and threw out shoulder, feeling a "pop". Since that time, she had a cortisone shot last year that initially caused severe arm swelling that scared her which resolved that night and the following day pain resolved and did not come back until 10 days ago. For the past 10 days, pain has been increasing in severity, diminishing ROM to no higher than 90 degrees abduction, making it hard to close and open a door and bathe herself. Voltaren gel is not helping, she is allergic to tylenol, and ibuprofen/NSAIDs trigger reflux/cyclic vomiting for her. She is very afraid to get another steroid shot. Denies current swelling or redness. This week, she cannot abduct at all. Hand has also started getting numb when she sleeps with it under her head.  Medication refills - Pt states she will not be back until 15th and would like refills on the following: - Oxycodone  - Lorazepam - Patient takes for anxiety and cyclic vomiting.  Review of Systems - Per HPI.   SH: Waiting on schedule for new job at [employer]. Very much wants to keep the job. Smoking status: Still reports she smokes marijuana though is trying to quit.    Objective:  Physical Exam BP 120/47  Pulse 70  Temp(Src) 99.4 F (37.4 C) (Oral)  Ht 5\' 1"  (1.549 m)  Wt 161 lb (73.029 kg)  BMI 30.44 kg/m2 GEN: NAD PULM: Normal effort EXTR:  Tenderness at right anterior shoulder at bicipital groove No erythema No swelling No crepitus or deformity seen ROM and strength exam - Flexion to 40 degrees prior to elicited pain and hesitance, but no loss of tone. Patient does not permit extension. ROM/strength exam limited by patient cooperation due to pain.    Assessment:     [name] [name] is a 48 y.o. female with h/o prior right shoulder pain here for  recurrence of pain.    Plan:     # See problem list and after visit summary for problem-specific plans. - CNA drew A1c. Did not discuss today. Will call pt with result.  Follow-up: Follow up in 2 weeks for f/u of shoulder pain.  Hilton Sinclair, MD [place]

## 2014-04-04 NOTE — Patient Instructions (Signed)
Good to see you.  For your shoulder pain, Take oxycodone for severe pain only. Follow up with me in 2 weeks. If still hurting, we will have you see sports medicine for an ultrasound. Consider injection as the medication in this has changed over the last 9 months. Do home Physical Therapy.  Hilton Sinclair, MD  Biceps Tendon Tendinitis (Proximal) and Tenosynovitis with Rehab Tendonitis and tenosynovitis involve inflammation of the tendon and the tendon lining (sheath). The proximal biceps tendon is vulnerable to tendonitis and tenosynovitis, which causes pain and discomfort in the front of the shoulder and upper arm. The tendon lining secretes a fluid that helps lubricate the tendon, allowing for proper function without pain. When the tendon and its lining become inflamed, the tendon can no longer glide smoothly, causing pain. The proximal biceps tendon connects the biceps muscle to two bones of the shoulder. It is important for proper function of the elbow and turning the palm upward (supination) using the wrist. Proximal biceps tendon tendinitis may include a grade 1 or 2 strain of the tendon. Grade 1 strains involve a slight pull of the tendon without signs of tearing and no observed tendon lengthening. There is also no loss of strength. Grade 2 strains involve small tears in the tendon fibers. The tendon or muscle is stretched and strength is usually decreased.  SYMPTOMS   Pain, tenderness, swelling, warmth, or redness over the front of the shoulder.  Pain that gets worse with shoulder and elbow use, especially against resistance.  Limited motion of the shoulder or elbow.  Crackling sound (crepitation) when the tendon or shoulder is moved or touched. CAUSES  The symptoms of biceps tendonitis are due to inflammation of the tendon. Inflammation may be caused by:  Strain from sudden increase in amount or intensity of activity.  Direct blow or injury to the elbow  (uncommon).  Overuse or repetitive elbow bending or wrist rotation, particularly when turning the palm up, or with elbow hyperextension. RISK INCREASES WITH:  Sports that involve contact or overhead arm activity (throwing sports, gymnastics, weightlifting, bodybuilding, rock climbing).  Heavy labor.  Poor strength and flexibility.  Failure to warm up properly before activity. PREVENTION  Warm up and stretch properly before activity.  Allow time for recovery between activities.  Maintain physical fitness:  Strength, flexibility, and endurance.  Cardiovascular fitness.  Learn and use proper exercise technique. PROGNOSIS  With proper treatment, proximal biceps tendon tendonitis and tenosynovitis is usually curable within 6 weeks. Healing is usually quicker if the cause was a direct blow, not overuse.  RELATED COMPLICATIONS   Longer healing time if not properly treated or if not given enough time to heal.  Chronically inflamed tendon that causes persistent pain with activity, that may progress to constant pain and potentially rupture of the tendon.  Recurring symptoms, especially if activity is resumed too soon or with overuse, a direct blow, or use of poor exercise technique. TREATMENT Treatment first involves ice and medicine, to reduce pain and inflammation. It is helpful to modify activities that cause pain, to reduce the chances of causing the condition to get worse. Strengthening and stretching exercises should be performed to promote proper use of the muscles of the shoulder. These exercises may be performed at home or with a therapist. Other treatments may be given such as ultrasound or heat therapy. A corticosteroid injection may be recommended to help reduce inflammation of the tendon lining. Surgery is usually not necessary. Sometimes, if symptoms last for  greater than 6 months, surgery will be advised to detach the tendon and re-insert it into the arm bone. Surgery to  correct other shoulder problems that may be contributing to tendinitis may be advised before surgery for the tendinitis itself.  MEDICATION  If pain medicine is needed, nonsteroidal anti-inflammatory medicines (aspirin and ibuprofen), or other minor pain relievers (acetaminophen), are often advised.  Do not take pain medicine for 7 days before surgery.  Prescription pain relievers may be given if your caregiver thinks they are needed. Use only as directed and only as much as you need.  Corticosteroid injections may be given. These injections should only be used on the most severe cases, as one can only receive a limited number of them. HEAT AND COLD   Cold treatment (icing) should be applied for 10 to 15 minutes every 2 to 3 hours for inflammation and pain, and immediately after activity that aggravates your symptoms. Use ice packs or an ice massage.  Heat treatment may be used before performing stretching and strengthening activities prescribed by your caregiver, physical therapist, or athletic trainer. Use a heat pack or a warm water soak. SEEK MEDICAL CARE IF:   Symptoms get worse or do not improve in 2 weeks, despite treatment.  New, unexplained symptoms develop. (Drugs used in treatment may produce side effects.) EXERCISES RANGE OF MOTION (ROM) AND EXERCISES - Biceps Tendon (Proximal) and Tenosynovitis These exercises may help you when beginning to rehabilitate your injury. Your symptoms may go away with or without further involvement from your physician, physical therapist, or athletic trainer. While completing these exercises, remember:   Restoring tissue flexibility helps normal motion to return to the joints. This allows healthier, less painful movement and activity.  An effective stretch should be held for at least 30 seconds.  A stretch should never be painful. You should only feel a gentle lengthening or release in the stretched tissue. STRETCH  Flexion, Standing  Stand  with good posture. With an underhand grip on your right / left hand and an overhand grip on the opposite hand, grasp a broomstick or cane so that your hands are a little more than shoulder width apart.  Keeping your right / left elbow straight and shoulder muscles relaxed, push the stick with your opposite hand to raise your right / left arm in front of your body and then overhead. Raise your arm until you feel a stretch in your right / left shoulder, but before you have increased shoulder pain.  Try to avoid shrugging your right / left shoulder as your arm rises, by keeping your shoulder blade tucked down and toward your mid-back spine. Hold for __________ seconds.  Slowly return to the starting position. Repeat __________ times. Complete this exercise __________ times per day. STRETCH  Abduction, Supine  Lie on your back. With an underhand grip on your right / left hand and an overhand grip on the opposite hand, grasp a broomstick or cane so that your hands are a little more than shoulder width apart.  Keeping your right / left elbow straight and shoulder muscles relaxed, push the stick with your opposite hand to raise your right / left arm out to the side of your body and then overhead. Raise your arm until you feel a stretch in your right / left shoulder, but before you have increased shoulder pain.  Try to avoid shrugging your right / left shoulder as your arm rises, by keeping your shoulder blade tucked down and toward your  mid-back spine. Hold for __________ seconds.  Slowly return to the starting position. Repeat __________ times. Complete this exercise __________ times per day. ROM  Flexion, Active-Assisted  Lie on your back. You may bend your knees for comfort.  Grasp a broomstick or cane so your hands are about shoulder width apart. Your right / left hand should grip the end of the stick so that your hand is positioned "thumbs-up," as if you were about to shake hands.  Using your  healthy arm to lead, raise your right / left arm overhead until you feel a gentle stretch in your shoulder. Hold for __________ seconds.  Use the stick to assist in returning your right / left arm to its starting position. Repeat __________ times. Complete this exercise __________ times per day.  STRETCH  Flexion, Standing   Stand facing a wall. Walk your right / left fingers up the wall until you feel a moderate stretch in your shoulder. As your hand gets higher, you may need to step closer to the wall or use a door frame to walk through.  Try to avoid shrugging your right / left shoulder as your arm rises, by keeping your shoulder blade tucked down and toward your mid-back spine.  Hold for __________ seconds. Use your other hand, if needed, to ease out of the stretch and return to the starting position. Repeat __________ times. Complete this exercise __________ times per day.  ROM - Internal Rotation   Using underhand grips, grasp a stick behind your back with both hands.  While standing upright with good posture, slide the stick up your back until you feel a mild stretch in the front of your shoulder.  Hold for __________ seconds. Slowly return to your starting position. Repeat __________ times. Complete this exercise __________ times per day.  STRETCH - Internal Rotation  Place your right / left hand behind your back, palm-up.  Throw a towel or belt over your opposite shoulder. Grasp the towel with your right / left hand.  While keeping an upright posture, gently pull up on the towel until you feel a stretch in the front of your right / left shoulder.  Avoid shrugging your right / left shoulder as your arm rises, by keeping your shoulder blade tucked down and toward your mid-back spine.  Hold for __________ seconds. Release the stretch by lowering your opposite hand. Repeat __________ times. Complete this exercise __________ times per day. STRENGTHENING EXERCISES - Biceps Tendon  Tendinitis (Proximal) and Tenosynovitis These exercises may help you regain your strength after your physician has discontinued your restraint in a cast or brace. They may resolve your symptoms with or without further involvement from your physician, physical therapist or athletic trainer. While completing these exercises, remember:   Muscles can gain both the endurance and the strength needed for everyday activities through controlled exercises.  Complete these exercises as instructed by your physician, physical therapist or athletic trainer. Increase the resistance and repetitions only as guided.  You may experience muscle soreness or fatigue, but the pain or discomfort you are trying to eliminate should never worsen during these exercises. If this pain does get worse, stop and make sure you are following the directions exactly. If the pain is still present after adjustments, discontinue the exercise until you can discuss the trouble with your caregiver. STRENGTH - Elbow Flexors, Isometric  Stand or sit upright on a firm surface. Place your right / left arm so that your hand is palm-up and at the  height of your waist.  Place your opposite hand on top of your forearm. Gently push down as your right / left arm resists. Push as hard as you can with both arms, without causing any pain or movement at your right / left elbow. Hold this stationary position for __________ seconds.  Gradually release the tension in both arms. Allow your muscles to relax completely before repeating. Repeat __________ times. Complete this exercise __________ times per day. STRENGTH - Shoulder Flexion, Isometric  With good posture and facing a wall, stand or sit about 4-6 inches away.  Keeping your right / left elbow straight, gently press the top of your fist into the wall. Increase the pressure gradually until you are pressing as hard as you can, without shrugging your shoulder or increasing any shoulder  discomfort.  Hold for __________ seconds.  Release the tension slowly. Relax your shoulder muscles completely before you start the next repetition. Repeat __________ times. Complete this exercise __________ times per day.  STRENGTH  Elbow Flexors, Supinated  With good posture, stand or sit on a firm chair without armrests. Allow your right / left arm to rest at your side with your palm facing forward.  Holding a __________ weight, or gripping a rubber exercise band or tubing,  bring your hand toward your shoulder.  Allow your muscles to control the resistance as your hand returns to your side. Repeat __________ times. Complete this exercise __________ times per day.  STRENGTH - Shoulder Flexion  Stand or sit with good posture. Grasp a __________ weight, or an exercise band or tubing, so that your hand is "thumbs-up," like when you shake hands.  Slowly lift your right / left arm as far as you can, without increasing any shoulder pain. At first, many people can only raise their hand to shoulder height.  Avoid shrugging your right / left shoulder as your arm rises, by keeping your shoulder blade tucked down and toward your mid-back spine.  Hold for __________ seconds. Control the descent of your hand as you slowly return to your starting position. Repeat __________ times. Complete this exercise __________ times per day. Document Released: 12/13/2005 Document Revised: 03/06/2012 Document Reviewed: 03/27/2009 [facility] Patient Information 2014 [place], [place].

## 2014-04-05 DIAGNOSIS — M67921 Unspecified disorder of synovium and tendon, right upper arm: Secondary | ICD-10-CM

## 2014-04-05 HISTORY — DX: Unspecified disorder of synovium and tendon, right upper arm: M67.921

## 2014-04-05 NOTE — Assessment & Plan Note (Addendum)
With tenderness at bicipital groove and otherwise exam very limited to pain, most likely biceps tendinopathy.  - Will refill oxycodone for short-term. Limited by tylenol allergy, fear of injection and resistance to NSAIDs. Think about injection as steroid we use in clinic has changed in last ~9 months (kenalog>>depomedrol). - PT recommended. Pt prefers to wait until she knows her [employer] schedule. - Shoulder exercises provided/printed to do at home. - Keep arm moving to avoid frozen shoulder. - Pain contract and UDS today. Pt could not urinate but will get this on return. Is honest about still using marijuana and though she wants to quit, she strongly believes it helps with her nausea and does not think she will be able to. - F/u 2 weeks. If no improvement, refer to [facility] for ultrasound evaluation. - Precepted with Dr Mingo Amber.

## 2014-04-05 NOTE — Assessment & Plan Note (Signed)
Anxiety seems to contribute largely to patient's cyclic vomiting and current shoulder pain. Would like to have her back to discuss better control and coping strategies. She has refused meeting with Dr Gwenlyn Saran in the past though I think she would benefit. - Refilled ativan 1 month.  - Needs follow-up as we did not discuss today.

## 2014-04-06 ENCOUNTER — Telehealth: Payer: Self-pay | Admitting: Family Medicine

## 2014-04-06 NOTE — Telephone Encounter (Signed)
A1c was ordered at last office visit and we did not have time to discuss as pt was coming in to discuss shoulder pain. A1c resulted 9.4, up from 7.2 in December. Please call and let patient know A1c is high and that we will need an office visit to evaluate and decide on management. In the meantime, she should watch any excess consumption of sweets and carbs and limit to no more than 2 carbs per meal. We can certainly discuss diet in more detail at f/u.  Thank you.  Hilton Sinclair, MD

## 2014-04-08 ENCOUNTER — Telehealth: Payer: Self-pay | Admitting: Family Medicine

## 2014-04-08 DIAGNOSIS — M25519 Pain in unspecified shoulder: Secondary | ICD-10-CM

## 2014-04-08 NOTE — Telephone Encounter (Signed)
Pt called and needs the ultra sound done earlier.She also was told what her A1c was and she is very upset that it is higher. She thought she was doing everything right. Please call jw

## 2014-04-08 NOTE — Telephone Encounter (Signed)
Spoke with patient and let her know that she will need a follow up before sending referral to sports medicine.  Pt is having severe shoulder pain and states that she is unable to care for herself.  Please advise if she can get this referral without a follow up appt.  [name],CMA

## 2014-04-09 NOTE — Telephone Encounter (Signed)
I think that is reasonable given her amount of pain and concern about this. Referral placed. Please call and let her know.  Hilton Sinclair, MD

## 2014-04-09 NOTE — Telephone Encounter (Signed)
Pt is aware of this.  She would like to know if you can refill her flexeril.  States that she spoke with her pharmacy and they informed her that this arm pain might be coming from a her muscles pulling in her neck and shoulder, and that she would benefit from a muscle relaxer.  Please advise.  [name],CMA

## 2014-04-16 MED ORDER — CYCLOBENZAPRINE HCL 5 MG PO TABS: 5.0000 mg | ORAL_TABLET | Freq: Three times a day (TID) | ORAL | Status: DC | PRN

## 2014-04-16 NOTE — Addendum Note (Signed)
Addended by: Conni Slipper T on: 04/16/2014 10:38 AM   Modules accepted: Orders

## 2014-04-16 NOTE — Telephone Encounter (Signed)
Per Tia pt is aware of this.  [name] [name],CMA

## 2014-04-16 NOTE — Telephone Encounter (Signed)
Let her know I have prescribed this for this acute issue with her shoulder, but she should not take it with her ativan or oxycodone and should be aware it can make her very drowsy, so she should not operate machinery for a 4-6 hours after taking this. It is still very important that she follows up with Sports Medicine.  Hilton Sinclair, MD

## 2014-04-18 ENCOUNTER — Encounter: Payer: Medicare Other | Admitting: Family Medicine

## 2014-04-18 NOTE — Progress Notes (Signed)
   Subjective:    Patient ID: [name] [name], female    DOB: 16-Jul-1966, 47 y.o.   MRN: [id number]  HPI      Review of Systems     Objective:   Physical Exam        Assessment & Plan:   This encounter was created in error - please disregard.

## 2014-04-19 ENCOUNTER — Encounter (INDEPENDENT_AMBULATORY_CARE_PROVIDER_SITE_OTHER): Payer: Self-pay

## 2014-04-19 ENCOUNTER — Encounter: Payer: Self-pay | Admitting: Family Medicine

## 2014-04-19 ENCOUNTER — Ambulatory Visit (INDEPENDENT_AMBULATORY_CARE_PROVIDER_SITE_OTHER): Payer: Medicare Other | Admitting: Family Medicine

## 2014-04-19 VITALS — BP 135/86 | Ht 61.0 in | Wt 157.0 lb

## 2014-04-19 DIAGNOSIS — M719 Bursopathy, unspecified: Secondary | ICD-10-CM

## 2014-04-19 DIAGNOSIS — M75100 Unspecified rotator cuff tear or rupture of unspecified shoulder, not specified as traumatic: Secondary | ICD-10-CM

## 2014-04-19 DIAGNOSIS — M6790 Unspecified disorder of synovium and tendon, unspecified site: Secondary | ICD-10-CM | POA: Diagnosis not present

## 2014-04-19 DIAGNOSIS — M67921 Unspecified disorder of synovium and tendon, right upper arm: Secondary | ICD-10-CM

## 2014-04-19 DIAGNOSIS — M67919 Unspecified disorder of synovium and tendon, unspecified shoulder: Secondary | ICD-10-CM | POA: Diagnosis not present

## 2014-04-19 HISTORY — DX: Unspecified rotator cuff tear or rupture of unspecified shoulder, not specified as traumatic: M75.100

## 2014-04-19 NOTE — Progress Notes (Signed)
   Subjective:    Patient ID: [name] [name], female    DOB: [birth year], 48 y.o.   MRN: [id number]  HPI Recurrence of right shoulder pain which she's had off and on intermittently over the last 2 years. No specific incident. Bothers her at night when she tries to sleep on her right side. Also bothers her she reaches forward or tries to pick something up evenness light as a coffee cup. Occasionally has numbness in her right hand upon awakening but notably she sleeps with her hand under her cheek. Other than the a.m. numbness which wears off after a few minutes, she's had no paresthesias numbness or weakness in her right arm. She works as a Secretary/administrator.   Review of Systems No fever, sweats, chills, unusual weight change.    Objective:   Physical Exam  Vital signs are reviewed GENERAL: Well-developed female no acute distress in SHOULDER: Right. Mild tenderness to palpation over the deltoid muscle. She has some pain with impingement and supraspinatus testing but full range of motion in full-strength in all planes the rotator cuff. This ULTRASOUND: Supraspinatus reveals just a small amount of scarring at the insertion. There is no increased Doppler activity here. Respiratory is without any sign of pathology. The a.c. joint has mild arthropathy but no significant effusion.      Assessment & Plan:  Rotator cuff syndrome. Long discussion. I think she needs to get this moving has been afraid to do much secondary to her pain. Previously she had a corticosteroid injection at did not work well for her so we'll start her today on home exercise program. She is reassured that she has not injuring her shoulder. I would use Aspercreme topically. I'll see her back in 3-4 weeks in advance her rehabilitation program. No restrictions on work.

## 2014-04-19 NOTE — Patient Instructions (Signed)
Use some over the counter ASPERCREAM several times a day for pain relief. ICE the shoulder for 15 minutes every day when you come home from work Do your exercises twice a day and see me in 3-4 weeks

## 2014-04-22 ENCOUNTER — Ambulatory Visit: Payer: Medicare Other | Admitting: Family Medicine

## 2014-05-10 ENCOUNTER — Ambulatory Visit: Payer: Medicare Other | Admitting: Family Medicine

## 2014-05-13 ENCOUNTER — Inpatient Hospital Stay (HOSPITAL_COMMUNITY)
Admission: EM | Admit: 2014-05-13 | Discharge: 2014-05-15 | DRG: 638 | Disposition: A | Discharge status: Home / Self Care | Payer: Medicare Other | Attending: Family Medicine | Admitting: Family Medicine

## 2014-05-13 ENCOUNTER — Encounter (HOSPITAL_COMMUNITY): Payer: Self-pay | Admitting: Emergency Medicine

## 2014-05-13 ENCOUNTER — Emergency Department (HOSPITAL_COMMUNITY): Payer: Medicare Other

## 2014-05-13 DIAGNOSIS — K3184 Gastroparesis: Secondary | ICD-10-CM | POA: Diagnosis present

## 2014-05-13 DIAGNOSIS — E1029 Type 1 diabetes mellitus with other diabetic kidney complication: Secondary | ICD-10-CM | POA: Diagnosis present

## 2014-05-13 DIAGNOSIS — F17200 Nicotine dependence, unspecified, uncomplicated: Secondary | ICD-10-CM | POA: Diagnosis present

## 2014-05-13 DIAGNOSIS — R197 Diarrhea, unspecified: Secondary | ICD-10-CM | POA: Diagnosis present

## 2014-05-13 DIAGNOSIS — E1310 Other specified diabetes mellitus with ketoacidosis without coma: Principal | ICD-10-CM | POA: Diagnosis present

## 2014-05-13 DIAGNOSIS — E1065 Type 1 diabetes mellitus with hyperglycemia: Secondary | ICD-10-CM

## 2014-05-13 DIAGNOSIS — I129 Hypertensive chronic kidney disease with stage 1 through stage 4 chronic kidney disease, or unspecified chronic kidney disease: Secondary | ICD-10-CM | POA: Diagnosis present

## 2014-05-13 DIAGNOSIS — N289 Disorder of kidney and ureter, unspecified: Secondary | ICD-10-CM | POA: Diagnosis not present

## 2014-05-13 DIAGNOSIS — N183 Chronic kidney disease, stage 3 unspecified: Secondary | ICD-10-CM | POA: Diagnosis present

## 2014-05-13 DIAGNOSIS — E46 Unspecified protein-calorie malnutrition: Secondary | ICD-10-CM | POA: Diagnosis present

## 2014-05-13 DIAGNOSIS — R1013 Epigastric pain: Secondary | ICD-10-CM

## 2014-05-13 DIAGNOSIS — E1110 Type 2 diabetes mellitus with ketoacidosis without coma: Secondary | ICD-10-CM | POA: Diagnosis present

## 2014-05-13 DIAGNOSIS — E1049 Type 1 diabetes mellitus with other diabetic neurological complication: Secondary | ICD-10-CM | POA: Diagnosis present

## 2014-05-13 DIAGNOSIS — Z66 Do not resuscitate: Secondary | ICD-10-CM | POA: Diagnosis present

## 2014-05-13 DIAGNOSIS — N189 Chronic kidney disease, unspecified: Secondary | ICD-10-CM

## 2014-05-13 DIAGNOSIS — R1115 Cyclical vomiting syndrome unrelated to migraine: Secondary | ICD-10-CM

## 2014-05-13 DIAGNOSIS — R112 Nausea with vomiting, unspecified: Secondary | ICD-10-CM | POA: Diagnosis not present

## 2014-05-13 DIAGNOSIS — K219 Gastro-esophageal reflux disease without esophagitis: Secondary | ICD-10-CM | POA: Diagnosis present

## 2014-05-13 DIAGNOSIS — F339 Major depressive disorder, recurrent, unspecified: Secondary | ICD-10-CM | POA: Diagnosis present

## 2014-05-13 DIAGNOSIS — Z91199 Patient's noncompliance with other medical treatment and regimen due to unspecified reason: Secondary | ICD-10-CM

## 2014-05-13 DIAGNOSIS — F1210 Cannabis abuse, uncomplicated: Secondary | ICD-10-CM | POA: Diagnosis present

## 2014-05-13 DIAGNOSIS — K2970 Gastritis, unspecified, without bleeding: Secondary | ICD-10-CM | POA: Diagnosis not present

## 2014-05-13 DIAGNOSIS — Z833 Family history of diabetes mellitus: Secondary | ICD-10-CM

## 2014-05-13 DIAGNOSIS — M25519 Pain in unspecified shoulder: Secondary | ICD-10-CM

## 2014-05-13 DIAGNOSIS — Z9119 Patient's noncompliance with other medical treatment and regimen: Secondary | ICD-10-CM

## 2014-05-13 DIAGNOSIS — Z6829 Body mass index (BMI) 29.0-29.9, adult: Secondary | ICD-10-CM

## 2014-05-13 DIAGNOSIS — N898 Other specified noninflammatory disorders of vagina: Secondary | ICD-10-CM | POA: Diagnosis present

## 2014-05-13 DIAGNOSIS — Z8249 Family history of ischemic heart disease and other diseases of the circulatory system: Secondary | ICD-10-CM

## 2014-05-13 DIAGNOSIS — D72829 Elevated white blood cell count, unspecified: Secondary | ICD-10-CM | POA: Diagnosis present

## 2014-05-13 LAB — BASIC METABOLIC PANEL
BUN: 57 mg/dL — AB (ref 6–23)
BUN: 55 mg/dL — ABNORMAL HIGH (ref 6–23)
BUN: 55 mg/dL — ABNORMAL HIGH (ref 6–23)
CALCIUM: 9 mg/dL (ref 8.4–10.5)
CHLORIDE: 105 meq/L (ref 96–112)
CO2: 19 mEq/L (ref 19–32)
CO2: 13 mEq/L — ABNORMAL LOW (ref 19–32)
CO2: 17 mEq/L — ABNORMAL LOW (ref 19–32)
CREATININE: 2.55 mg/dL — AB (ref 0.50–1.10)
Calcium: 9 mg/dL (ref 8.4–10.5)
Calcium: 8.7 mg/dL (ref 8.4–10.5)
Chloride: 107 mEq/L (ref 96–112)
Chloride: 109 mEq/L (ref 96–112)
Creatinine, Ser: 2.73 mg/dL — ABNORMAL HIGH (ref 0.50–1.10)
Creatinine, Ser: 2.7 mg/dL — ABNORMAL HIGH (ref 0.50–1.10)
GFR calc non Af Amer: 20 mL/min — ABNORMAL LOW (ref >90)
GFR, EST AFRICAN AMERICAN: 22 mL/min — AB (ref >90)
GFR, EST AFRICAN AMERICAN: 23 mL/min — AB (ref >90)
GFR, EST AFRICAN AMERICAN: 24 mL/min — AB (ref >90)
GFR, EST NON AFRICAN AMERICAN: 19 mL/min — AB (ref >90)
GFR, EST NON AFRICAN AMERICAN: 21 mL/min — AB (ref >90)
Glucose, Bld: 366 mg/dL — ABNORMAL HIGH (ref 70–99)
Glucose, Bld: 316 mg/dL — ABNORMAL HIGH (ref 70–99)
Glucose, Bld: 244 mg/dL — ABNORMAL HIGH (ref 70–99)
POTASSIUM: 4.1 meq/L (ref 3.7–5.3)
Potassium: 4.9 mEq/L (ref 3.7–5.3)
Potassium: 4.7 mEq/L (ref 3.7–5.3)
SODIUM: 143 meq/L (ref 137–147)
SODIUM: 144 meq/L (ref 137–147)
Sodium: 138 mEq/L (ref 137–147)

## 2014-05-13 LAB — LIPASE, BLOOD: Lipase: 58 U/L (ref 11–59)

## 2014-05-13 LAB — COMPREHENSIVE METABOLIC PANEL
ALT: 9 U/L (ref 0–35)
AST: 15 U/L (ref 0–37)
Albumin: 4.8 g/dL (ref 3.5–5.2)
Alkaline Phosphatase: 117 U/L (ref 39–117)
BUN: 60 mg/dL — AB (ref 6–23)
CO2: 16 meq/L — AB (ref 19–32)
CREATININE: 3.19 mg/dL — AB (ref 0.50–1.10)
Calcium: 10.4 mg/dL (ref 8.4–10.5)
Chloride: 96 mEq/L (ref 96–112)
GFR, EST AFRICAN AMERICAN: 19 mL/min — AB (ref >90)
GFR, EST NON AFRICAN AMERICAN: 16 mL/min — AB (ref >90)
Glucose, Bld: 569 mg/dL (ref 70–99)
Potassium: 3.9 mEq/L (ref 3.7–5.3)
Sodium: 139 mEq/L (ref 137–147)
Total Bilirubin: 0.6 mg/dL (ref 0.3–1.2)
Total Protein: 8.7 g/dL — ABNORMAL HIGH (ref 6.0–8.3)

## 2014-05-13 LAB — CBG MONITORING, ED
GLUCOSE-CAPILLARY: 439 mg/dL — AB (ref 70–99)
GLUCOSE-CAPILLARY: 246 mg/dL — AB (ref 70–99)
GLUCOSE-CAPILLARY: 267 mg/dL — AB (ref 70–99)
Glucose-Capillary: 480 mg/dL — ABNORMAL HIGH (ref 70–99)
Glucose-Capillary: 356 mg/dL — ABNORMAL HIGH (ref 70–99)
Glucose-Capillary: 311 mg/dL — ABNORMAL HIGH (ref 70–99)
Glucose-Capillary: 345 mg/dL — ABNORMAL HIGH (ref 70–99)

## 2014-05-13 LAB — I-STAT VENOUS BLOOD GAS, ED
Acid-base deficit: 6 mmol/L — ABNORMAL HIGH (ref 0.0–2.0)
BICARBONATE: 20.5 meq/L (ref 20.0–24.0)
O2 SAT: 63 %
TCO2: 22 mmol/L (ref 0–100)
pCO2, Ven: 42.7 mmHg — ABNORMAL LOW (ref 45.0–50.0)
pH, Ven: 7.289 (ref 7.250–7.300)
pO2, Ven: 36 mmHg (ref 30.0–45.0)

## 2014-05-13 LAB — WET PREP, GENITAL
Clue Cells Wet Prep HPF POC: NONE SEEN
Trich, Wet Prep: NONE SEEN
Yeast Wet Prep HPF POC: NONE SEEN

## 2014-05-13 LAB — RAPID URINE DRUG SCREEN, HOSP PERFORMED
AMPHETAMINES: NOT DETECTED
BARBITURATES: NOT DETECTED
Benzodiazepines: NOT DETECTED
Cocaine: NOT DETECTED
Opiates: NOT DETECTED
TETRAHYDROCANNABINOL: POSITIVE — AB

## 2014-05-13 LAB — URINE MICROSCOPIC-ADD ON

## 2014-05-13 LAB — CBC WITH DIFFERENTIAL/PLATELET
BASOS PCT: 0 % (ref 0–1)
Basophils Absolute: 0 10*3/uL (ref 0.0–0.1)
EOS PCT: 0 % (ref 0–5)
Eosinophils Absolute: 0 10*3/uL (ref 0.0–0.7)
HEMATOCRIT: 37.3 % (ref 36.0–46.0)
HEMOGLOBIN: 13.2 g/dL (ref 12.0–15.0)
LYMPHS PCT: 16 % (ref 12–46)
Lymphs Abs: 2.2 10*3/uL (ref 0.7–4.0)
MCH: 31.4 pg (ref 26.0–34.0)
MCHC: 35.4 g/dL (ref 30.0–36.0)
MCV: 88.8 fL (ref 78.0–100.0)
MONO ABS: 0.8 10*3/uL (ref 0.1–1.0)
MONOS PCT: 6 % (ref 3–12)
NEUTROS ABS: 10.6 10*3/uL — AB (ref 1.7–7.7)
Neutrophils Relative %: 78 % — ABNORMAL HIGH (ref 43–77)
Platelets: 216 10*3/uL (ref 150–400)
RBC: 4.2 MIL/uL (ref 3.87–5.11)
RDW: 12.5 % (ref 11.5–15.5)
WBC: 13.6 10*3/uL — ABNORMAL HIGH (ref 4.0–10.5)

## 2014-05-13 LAB — URINALYSIS, ROUTINE W REFLEX MICROSCOPIC
Bilirubin Urine: NEGATIVE
Glucose, UA: >1000 mg/dL — AB
HGB URINE DIPSTICK: NEGATIVE
Ketones, ur: 15 mg/dL — AB
Leukocytes, UA: NEGATIVE
NITRITE: NEGATIVE
PROTEIN: 30 mg/dL — AB
SPECIFIC GRAVITY, URINE: 1.024 (ref 1.005–1.030)
Urobilinogen, UA: 0.2 mg/dL (ref 0.0–1.0)
pH: 5 (ref 5.0–8.0)

## 2014-05-13 LAB — KETONES, QUALITATIVE: Acetone, Bld: NEGATIVE

## 2014-05-13 LAB — I-STAT TROPONIN, ED: TROPONIN I, POC: 0.01 ng/mL (ref 0.00–0.08)

## 2014-05-13 MED ORDER — MORPHINE SULFATE 2 MG/ML IJ SOLN
2.0000 mg | INTRAMUSCULAR | Status: DC | PRN
  Administered 2014-05-13: 2 mg via INTRAVENOUS
  Filled 2014-05-13: qty 1

## 2014-05-13 MED ORDER — FAMOTIDINE IN NACL 20-0.9 MG/50ML-% IV SOLN
20.0000 mg | Freq: Once | INTRAVENOUS | Status: AC
  Administered 2014-05-13: 20 mg via INTRAVENOUS
  Filled 2014-05-13: qty 50

## 2014-05-13 MED ORDER — INSULIN REGULAR BOLUS VIA INFUSION
0.0000 [IU] | Freq: Three times a day (TID) | INTRAVENOUS | Status: DC
  Administered 2014-05-14: 0 [IU] via INTRAVENOUS
  Filled 2014-05-13: qty 10

## 2014-05-13 MED ORDER — INSULIN ASPART 100 UNIT/ML ~~LOC~~ SOLN: 0.0000 [IU] | SUBCUTANEOUS | Status: DC

## 2014-05-13 MED ORDER — POTASSIUM CHLORIDE 10 MEQ/100ML IV SOLN
10.0000 meq | INTRAVENOUS | Status: AC
  Administered 2014-05-13 (×2): 10 meq via INTRAVENOUS
  Filled 2014-05-13 (×2): qty 100

## 2014-05-13 MED ORDER — DEXTROSE-NACL 5-0.45 % IV SOLN
INTRAVENOUS | Status: DC
  Administered 2014-05-13 – 2014-05-14 (×2): via INTRAVENOUS

## 2014-05-13 MED ORDER — LORAZEPAM 1 MG PO TABS
1.0000 mg | ORAL_TABLET | Freq: Two times a day (BID) | ORAL | Status: DC | PRN
  Administered 2014-05-14 (×2): 1 mg via ORAL
  Filled 2014-05-13 (×4): qty 1

## 2014-05-13 MED ORDER — ONDANSETRON HCL 4 MG PO TABS: 4.0000 mg | ORAL_TABLET | Freq: Four times a day (QID) | ORAL | Status: DC | PRN

## 2014-05-13 MED ORDER — AMLODIPINE BESYLATE 10 MG PO TABS
10.0000 mg | ORAL_TABLET | Freq: Every day | ORAL | Status: DC
  Administered 2014-05-15: 10 mg via ORAL
  Filled 2014-05-13 (×4): qty 1

## 2014-05-13 MED ORDER — CARVEDILOL 25 MG PO TABS
25.0000 mg | ORAL_TABLET | Freq: Two times a day (BID) | ORAL | Status: DC
  Administered 2014-05-14 – 2014-05-15 (×3): 25 mg via ORAL
  Filled 2014-05-13 (×6): qty 1

## 2014-05-13 MED ORDER — SODIUM CHLORIDE 0.9 % IV BOLUS (SEPSIS)
2000.0000 mL | Freq: Once | INTRAVENOUS | Status: AC
  Administered 2014-05-13: 2000 mL via INTRAVENOUS

## 2014-05-13 MED ORDER — SODIUM CHLORIDE 0.9 % IV BOLUS (SEPSIS)
500.0000 mL | Freq: Once | INTRAVENOUS | Status: AC
  Administered 2014-05-13: 500 mL via INTRAVENOUS

## 2014-05-13 MED ORDER — PROMETHAZINE HCL 25 MG PO TABS
25.0000 mg | ORAL_TABLET | Freq: Three times a day (TID) | ORAL | Status: DC | PRN
  Administered 2014-05-13 – 2014-05-14 (×3): 25 mg via ORAL
  Filled 2014-05-13 (×4): qty 1

## 2014-05-13 MED ORDER — PROMETHAZINE HCL 25 MG/ML IJ SOLN
12.5000 mg | Freq: Once | INTRAMUSCULAR | Status: AC
  Administered 2014-05-13: 12.5 mg via INTRAVENOUS
  Filled 2014-05-13 (×2): qty 1

## 2014-05-13 MED ORDER — INSULIN REGULAR HUMAN 100 UNIT/ML IJ SOLN
INTRAMUSCULAR | Status: DC
  Administered 2014-05-13: 3.8 [IU]/h via INTRAVENOUS
  Administered 2014-05-13: 20:00:00 via INTRAVENOUS
  Filled 2014-05-13: qty 1

## 2014-05-13 MED ORDER — HYDROCHLOROTHIAZIDE 25 MG PO TABS
25.0000 mg | ORAL_TABLET | Freq: Every day | ORAL | Status: DC
  Administered 2014-05-13 – 2014-05-15 (×3): 25 mg via ORAL
  Filled 2014-05-13 (×3): qty 1

## 2014-05-13 MED ORDER — DEXTROSE 50 % IV SOLN: 25.0000 mL | INTRAVENOUS | Status: DC | PRN

## 2014-05-13 MED ORDER — HEPARIN SODIUM (PORCINE) 5000 UNIT/ML IJ SOLN
5000.0000 [IU] | Freq: Three times a day (TID) | INTRAMUSCULAR | Status: DC
  Administered 2014-05-13 – 2014-05-15 (×4): 5000 [IU] via SUBCUTANEOUS
  Filled 2014-05-13 (×8): qty 1

## 2014-05-13 MED ORDER — FAMOTIDINE 40 MG PO TABS
40.0000 mg | ORAL_TABLET | Freq: Every day | ORAL | Status: DC
  Administered 2014-05-14: 40 mg via ORAL
  Filled 2014-05-13: qty 1

## 2014-05-13 MED ORDER — LORAZEPAM 2 MG/ML IJ SOLN
1.0000 mg | Freq: Once | INTRAMUSCULAR | Status: AC
  Administered 2014-05-13: 1 mg via INTRAVENOUS
  Filled 2014-05-13: qty 1

## 2014-05-13 MED ORDER — HYDRALAZINE HCL 20 MG/ML IJ SOLN: 5.0000 mg | INTRAMUSCULAR | Status: DC | PRN

## 2014-05-13 MED ORDER — ONDANSETRON HCL 4 MG/2ML IJ SOLN: 4.0000 mg | Freq: Four times a day (QID) | INTRAMUSCULAR | Status: DC | PRN

## 2014-05-13 MED ORDER — SODIUM CHLORIDE 0.9 % IV SOLN
INTRAVENOUS | Status: DC
  Administered 2014-05-13: 18:00:00 via INTRAVENOUS

## 2014-05-13 MED ORDER — LISINOPRIL 40 MG PO TABS
40.0000 mg | ORAL_TABLET | Freq: Every morning | ORAL | Status: DC
  Administered 2014-05-14 – 2014-05-15 (×2): 40 mg via ORAL
  Filled 2014-05-13 (×2): qty 1

## 2014-05-13 MED ORDER — HYDROMORPHONE HCL PF 1 MG/ML IJ SOLN
1.0000 mg | Freq: Once | INTRAMUSCULAR | Status: AC
  Administered 2014-05-13: 1 mg via INTRAVENOUS
  Filled 2014-05-13: qty 1

## 2014-05-13 NOTE — ED Notes (Addendum)
cycliic vomiting syndrome since Sunday has not been taking meds cbg 462 bp 190/110 has 18 g IV per ems

## 2014-05-13 NOTE — ED Notes (Signed)
[id number] 

## 2014-05-13 NOTE — Progress Notes (Signed)
Family Medicine Teaching Service Daily Progress Note Intern Pager: [phone]  Patient name: [name] [name] Medical record number: [id number] Date of birth: 07-29-1966 Age: 48 y.o. Gender: female  Primary Care Provider: Conni Slipper, MD Consultants: None Code Status: DNR - confirmed on admission  Pt Overview and Major Events to Date:  5/18 - Admitted to SDU, DKA on glucose stabilizer 5/19 - AG improved 18-->14-->12, CBGs < 140s, n/v improved. Transition Glucose Stabilizer to Lantus 10u +SSI  Assessment and Plan: [name] [name] is a 48 y.o. female presenting with hyperglycemia with acidosis. PMH is significant for cyclic vomiting, protein-calorie malnutrition, uncontrolled Type II DM, esophagitis, CKD, HTN and Marijuana abuse  # DKA secondary to uncontrolled T2DM - Resolved - Initially admitted to SDU for DKA with CBG 569, bicarb 16, AG 27, VBG with nml pH. Suspected initial trigger d/t recent non-compliance with missed insulin doses x 2-3 days - s/p 2L NS bolus, continued IVF rehydration protocol - s/p Glucose Stabilizer until AG closed - s/p KCl 10 mEq IV x 2 - AG closed 14, 12. BMET q 4 hours x 2, then daily BMET  # Uncontrolled T2DM - Recent HgbA1c 9.4 (04/04/14) - Start Lantus 10u daily (@0900 ) (half of home 20u daily dose) (started 1 hour prior to [place] Glucose Stabilizer), can increase Lantus dose up to 70-80% of home dose if improves PO today - Moderate SSI, without meal coverage - CBG monitoring q 4 hr qACs  # Hypertension - Improved - Suspect recent significant elevated BPs due to recent missed anti-HTN doses. On admission, significantly high BP at 210/95, s/p pain meds, anti-emetics, anti-HTN, since continued improvement to 160s/80s. - Continue home anti-HTN regimen: Amlodipine 10mg  daily, HCTZ 25mg  daily, Coreg 25mg  BID,  - Hydralazine IV 5mg  q 4 hr PRN orders with parameters ([id number] / [id number])  # Cyclic vomiting Syndrome: patient with long standing h/o cyclic vomiting.  Last marijuana use Saturday. Pateint per office notes has had gastric empty study in 2012 with moderate delay in gastric emptying. She again was seen by [facility] in 2014 without note to treat as esophagitis/gastritis and no furhter endoscopy warranted. Regimen that has been proven to work on prior admissions is Zofran, phenergan, ativan and pain medications.  - Zofran, phenergan, ativan - Oxy IR 5mg  q 4 hr PRN for abd pain (morphine inc nausea, allergy to tylenol) - Pepcid 40mg  PO daily (s/p 20mg  IV on admission) - ice chips + clear liquids, AAT - Continue to monitor and de-escalate treatment when appropriate.  # Mild Leukocytosis: - On admission WBC 13.6, neutrophil predominant. Possibly could be from cyclic vomiting stress. Patient with GI symptoms including diarrhea for two days. White count could be from Gastroenteritis or virus. CXR is normal.  - Consider further work-up (stool culture and CDiff PCR) if persistent diarrhea - CT scan at this time is not warranted, but could be considered if patient does not improve.  # Vaginal discharge - complaints of discharge after possible STD exposure. Pelvic exam completed with wet prep and gonorrhea/chlamydia. Pelvic exam without concern for PID or obvious infection.  - Wet prep normal  - G/C vaginal cuff cultures (negative) - 05/13/14 - Urine cytology to be collected off first morning urine.  FEN/GI:  Clear liquid diet, advance to carb modified as tolerated  Prophylaxis: Heparin SQ  Disposition: Admitted to [facility], SDU for Glucose Stabilizer, currently with closed AG and discontinued Glucose Stabilizer, anticipate to be transferred to regular floor to continue management. Expect discharge to home  when resolved N/V, tolerating PO.  Subjective:  Resting in bed, significantly improved from admission. "Little nauseas", no recent vomiting episodes, states that morphine often makes her nauseas, would like either Dilaudid or pain medicine without  tylenol (allergy), still have some abdominal pain (had been holding out on asking for pain meds). Also, asks about potentially going home today or tomorrow. Tolerating clear diet. Still on insulin drip.  Objective: Temp:  [98.1 F (36.7 C)-98.3 F (36.8 C)] 98.2 F (36.8 C) (05/18 2027) Pulse Rate:  [74-112] 75 (05/18 2200) Resp:  [13-24] 13 (05/18 2200) BP: (121-210)/(58-114) 121/58 mmHg (05/18 2200) SpO2:  [98 %-100 %] 100 % (05/18 2200) Physical Exam: General: well-appearing, comfortable, NAD HEENT: sclera clear, dry tongue and MM Cardiovascular: RRR, no murmurs Respiratory: CTAB, nml effort Abdomen: soft, mild epigastric / central tenderness to deep palpation, no rebound, no guarding, negative McBurney's, +hyperactive BS in all quads Extremities: moves all ext, no edema, non-tender, +2 peripheral pulses Neuro: awake, alert, oriented, grossly intact  Laboratory:  Recent Labs Lab 05/13/14 1543  WBC 13.6*  HGB 13.2  HCT 37.3  PLT 216    Recent Labs Lab 05/13/14 1543 05/13/14 1919 05/13/14 2052 05/13/14 2156  NA 139 143 138 144  K 3.9 4.1 4.9 4.7  CL 96 107 105 109  CO2 16* 19 13* 17*  BUN 60* 57* 55* 55*  CREATININE 3.19* 2.73* 2.70* 2.55*  CALCIUM 10.4 9.0 9.0 8.7  PROT 8.7*  --   --   --   BILITOT 0.6  --   --   --   ALKPHOS 117  --   --   --   ALT 9  --   --   --   AST 15  --   --   --   GLUCOSE 569* 366* 316* 244*   ABG    Component Value Date/Time   HCO3 20.5 05/13/2014 1742   TCO2 22 05/13/2014 1742   ACIDBASEDEF 6.0* 05/13/2014 1742   O2SAT 63.0 05/13/2014 1742   POCT I-stat Trop - 0.01  UA - >1000 glucose, 15 ketones, neg nitrite, neg leuk, neg hgb, WBC 0-2, rare squam, few bact UDS - THC (positive) Wet Prep - No trich / yeast / clue cells. Few WBCs   Imaging/Diagnostic Tests:  5/18 Acute Abd 2v + CXR 1v IMPRESSION:  1. There is no evidence of active cardiopulmonary disease.  2. No acute intra-abdominal abnormality is demonstrated.   Specifically there are no findings to suggest gastroenteritis or  bowel obstruction  5/18 EKG Sinus tachycardia, HR 100, with LAE, old anterior infarct. No acute ST-T wave changes. Compared to prior EKG 01/03/14  Nobie Putnam, DO 05/13/2014, 11:27 PM PGY-1, [facility] Intern pager: [phone], text pages welcome

## 2014-05-13 NOTE — ED Notes (Signed)
Patient transported to X-ray 

## 2014-05-13 NOTE — H&P (Signed)
[facility] Admission History and Physical Service Pager: [phone]  Patient name: [name] [name] Medical record number: [id number] Date of birth: Dec 04, 1966 Age: 48 y.o. Gender: female  Primary Care Provider: Conni Slipper, MD Consultants: None  Code Status: DNR, discussed in length this admission  Chief Complaint: epigastric pain, vomitting  Assessment and Plan: [name] [name] is a 48 y.o. female presenting with hyperglycemia with acidosis. PMH is significant for cyclic vomiting, protein-calorie malnutrition, uncontrolled Type II DM, esophagitis, CKD, HTN and  Marijuana abuse  Uncontrolled Diabetes/DKA: A1c 4 weeks ago 9.4. On admission pt with blood glucose of 569, likely d/t non-compliance. Bicarb 16. AG 27. VBG after insulin was started was with normal pH and bicarb.  - NS @ 250 ml/hr; change to D5 1/2 NS once BG <150.  - Supplement potassium with KCL x2.  - Glucomander per protocol.  - BMET every 2 hours until BG <140 x2 and anion gap is closed, then BMET every 4 hours x2.   Hypertension: Patient has not taking any medication since Saturday.  - Continue current hypertension regimen: Norvasc, coreg, HCTZ (Patient now tolerating ice chips and should be able tolerate oral medication) - If unable to tolerate oral meds will treat with IV hydralazine or IV labetalol.  - Hydralazine PRN orders with parameters   Cyclic vomiting: patient with long standing h/o cyclic vomiting. Last marijuana use Saturday. Pateint per office notes has had gastric empty study in 2012 with moderate delay in gastric emptying. She again was seen by [facility] in 2014 without note to treat as esophagitis/gastritis and no furhter endoscopy warranted. Regimen that has been proven to work on prior admissions is Zofran, phenergan, ativan and pain medications.  - Zofran, phenergan, ativan and morphine for abdominal pain.  - Pepcid IV x1, then continue PO tomorrow.  - ice chips,  advance as tolerated to carb modified.  - Continue to monitor and de-escalate treatment when appropriate.   Elevated WBC (13.6): Neutrophil predominant. Possibly could be from cyclic vomiting stress. Patient with GI symptoms including diarrhea for two days. White count could be from Gastroenteritis or virus. CXR is normal.  - Will obtain stool sample for culture - CT scan at this time is not warranted, but could be considered if patient does not improve.    Vaginal discharge: complaints of discharge after possible STD exposure. Pelvic exam completed with wet prep and gonorrhea/chlamydia. Pelvic exam without concern for PID or obvious infection.  - Wet prep normal  - G/C vaginal cuff cultures pending.  - Urine cytology to be collected off first morning urine.    FEN/GI:  Clear liquid diet, advance to carb modified as tolerated Prophylaxis: Hep SQ  Disposition: Admit to Step down with glucomander  History of Present Illness: [name] [name] is a 48 y.o. female presenting with nausea, vomit, diarrhea since Sunday. Patient states she has been unable to tolerate anything by mouth since Sunday. She endorses yellow/green vomit with "little red streaks" multiple times over the last few days. She is unable to describe amount. She reports her diarrhea has been for two days and is non-bloody, yellowish fluid. She endorses epigastric pain that she states is a 10/10 pain and is consistent with her cyclic vomiting pain. She denies fever or chills, but admits she has been waking with mild night sweats. She is uncertain of she has been exposed to sick contacts, since she now depends on the bus for transportation. She endorses vaginal discharge and is  concerned she could have been exposed to an STD because of the way her partner has been acting around her and her vaginal discharge started after they were together. She admits to last marijuana use on Saturday and denies alcohol use or other recreational drugs. She  has missed all medications, including lantus administration, since Saturday. She does not check her sugars regularly.   Review Of Systems: Per HPI Otherwise 12 point review of systems was performed and was unremarkable.  Patient Active Problem List   Diagnosis Date Noted  . DKA (diabetic ketoacidoses) 05/13/2014  . Rotator cuff syndrome 04/19/2014  . Biceps tendinopathy of right upper extremity 04/05/2014  . Protein-calorie malnutrition, severe 12/17/2013  . Acute epigastric pain 12/15/2013  . Anemia 11/15/2013  . Pancreatitis, acute 10/14/2013  . CKD (chronic kidney disease) 08/17/2013  . Chest pain 06/25/2013  . Abdominal pain, epigastric 04/19/2013  . Esophagitis 04/18/2013  . Preventative health care 09/18/2012  . High risk social situation 08/31/2012  . Allergic rhinitis, seasonal 05/04/2012  . Marijuana smoker 11/03/2011  . Uncontrolled secondary diabetes mellitus with stage 3 CKD (GFR 30-59) 10/23/2011  . Cyclic vomiting syndrome 08/03/2011  . Hot flashes 07/29/2011  . BACK PAIN, CHRONIC 04/10/2007  . DEPRESSION, MAJOR, RECURRENT 02/23/2007  . ANXIETY 02/23/2007  . TOBACCO DEPENDENCE 02/23/2007  . MIGRAINE, UNSPEC., W/O INTRACTABLE MIGRAINE 02/23/2007  . Essential hypertension, malignant 02/23/2007  . INSOMNIA NOS 02/23/2007  . NEUROPATHY, DIABETIC 02/23/2007  . GERD 01/27/2004   Past Medical History: Past Medical History  Diagnosis Date  . PANCREATITIS 05/09/2008  . Cyclic vomiting syndrome   . Hypertension   . Complication of anesthesia     "problems waking up"  . Heart murmur   . Myocardial infarction 07/2007    "they say I've had a silent one; I don't know"  . Pneumonia   . Anemia   . Blood transfusion   . GERD (gastroesophageal reflux disease)   . Anxiety   . Depression   . Smoker   . Marijuana smoker, continuous   . NEUROPATHY, DIABETIC 02/23/2007  . Type II diabetes mellitus   . Diabetic gastroparesis     /e-chart  . Chronic kidney disease (CKD),  stage III (moderate)    Past Surgical History: Past Surgical History  Procedure Laterality Date  . Port-a-cath placement  ~ 2008    right chest; "poor access; frequent hospitalizations"  . Laparotomy and lysis of adhesions    . Abdominal hysterectomy  02/2002  . Cholecystectomy  1980's  . Cardiac catheterization    . Dilation and evacuation  X3  . Left hand surgery    . Cataract extraction w/ intraocular lens  implant, bilateral    . Colonoscopy    . Upper gi endoscopy     Social History: History  Substance Use Topics  . Smoking status: Current Some Day Smoker -- 0.50 packs/day for 22 years    Types: Cigarettes  . Smokeless tobacco: Never Used  . Alcohol Use: No   Additional social history: Please see HPI Please also refer to relevant sections of EMR.  Family History: Family History  Problem Relation Age of Onset  . Diabetes type II Mother   . Diabetes Mother   . Hypertension Mother   . Diabetes type II Sister   . Diabetes Sister   . Hypertension Sister   . Hypertension Brother    Allergies and Medications: Allergies  Allergen Reactions  . Bee Venom Anaphylaxis  . Acetaminophen Nausea And Vomiting  .  Novolog [Insulin Aspart] Other (See Comments)    Muscles in feet cramp  . Erythromycin Nausea And Vomiting   Current Facility-Administered Medications on File Prior to Encounter  Medication Dose Route Frequency Provider Last Rate Last Dose  . 0.9 %  sodium chloride infusion   Intravenous Continuous Andrena Mews, MD 20 mL/hr at 04/19/13 1500 500 mL at 04/19/13 1500   Current Outpatient Prescriptions on File Prior to Encounter  Medication Sig Dispense Refill  . amLODipine (NORVASC) 10 MG tablet       . carvedilol (COREG) 25 MG tablet Take 1 tablet (25 mg total) by mouth 2 (two) times daily with a meal.  60 tablet  5  . cyclobenzaprine (FLEXERIL) 5 MG tablet Take 1 tablet (5 mg total) by mouth 3 (three) times daily as needed. For spasms  30 tablet  0  . diclofenac  sodium (VOLTAREN) 1 % GEL Apply 2 g topically 4 (four) times daily as needed (for pain).  1 Tube  5  . famotidine (PEPCID) 40 MG tablet Take 1 tablet (40 mg total) by mouth daily.  30 tablet  3  . hydrochlorothiazide (HYDRODIURIL) 25 MG tablet Take 1 tablet (25 mg total) by mouth daily.  30 tablet  3  . insulin glargine (LANTUS) 100 UNIT/ML injection Inject 0.2 mLs (20 Units total) into the skin every morning.  10 mL  5  . LORazepam (ATIVAN) 1 MG tablet Take 1 tablet (1 mg total) by mouth 2 (two) times daily as needed for anxiety.  60 tablet  0  . oxyCODONE (OXY IR/ROXICODONE) 5 MG immediate release tablet Take 1 tablet (5 mg total) by mouth daily as needed.  30 tablet  0  . promethazine (PHENERGAN) 25 MG tablet Take 1 tablet (25 mg total) by mouth every 8 (eight) hours as needed for nausea.  30 tablet  1  . SUMAtriptan (IMITREX) 50 MG tablet Take 50 mg by mouth every 2 (two) hours as needed for migraine. May repeat in 2 hours if headache persists or recurs.        Objective: BP 149/107  Pulse 102  Temp(Src) 98.3 F (36.8 C) (Oral)  Resp 24  SpO2 100% Exam: Gen: Obese, AAF. In no acute distress and nontoxic in appearance.  HEENT: AT. [place]. Bilateral eyes without mild injections and icterus. MMM. Bilateral nares with mild erythema, no swelling. . Throat without erythema or exudates.  CV: Tachycardic, no murmurs, clicks, gallops or rubs.  Chest: CTAB, no wheeze or crackles. Diminished breath sounds bilaterally. Abd: Soft. Mildly obese. ND. Epigastric tenderness to palpation.  BS Hypoactive. No Masses palpated. Midline umbilical scar and transverse scar appreciated.  Ext: No erythema. No edema. +2/4 PT and DP. No ulcerations or skin breakdown of feet. Thickened toe nails bilateral feet.  Skin: No rashes, purpura or petechiae.  Neuro:  PERLA. EOMi. Alert x4. CN 2-12 grossly intact, no focal deficits.    GYN:  External genitalia within normal limits.  Vaginal mucosa pink, moist, normal rugae.   Prior hysterectomy, no notable cuff irritation. No lesions noted. Moderate white creamy discharge,  No bleeding or odor. No adnexal masses bilaterally, no tenderness.   (Pt had rt oophorectomy with hysterectomy)   Labs and Imaging: CBC BMET   Recent Labs Lab 05/13/14 1543  WBC 13.6*  HGB 13.2  HCT 37.3  PLT 216    Recent Labs Lab 05/13/14 1543  NA 139  K 3.9  CL 96  CO2 16*  BUN 60*  CREATININE  3.19*  GLUCOSE 569*  CALCIUM 10.4      Ma Hillock, DO 05/13/2014, 8:10 PM PGY-2, [facility] Intern pager: [phone], text pages welcome

## 2014-05-13 NOTE — ED Provider Notes (Signed)
CSN: [id number]     Arrival date & time 05/13/14  1329 History   First MD Initiated Contact with Patient 05/13/14 1522     Chief Complaint  Patient presents with  . Emesis     (Consider location/radiation/quality/duration/timing/severity/associated sxs/prior Treatment) HPI Complains of epigastric pain and multiple episodes of vomiting, greater than 10 onset yesterday typical of "cyclic vomiting syndrome" she's had in the past. She treated herself with Phenergan suppositories, without relief. She denies fever denies chest pain denies other associated symptoms. Nothing makes symptoms better or worse. Pain is constant, nonradiating epigastric, sharp and severe she's been treated with Ativan, Phenergan and opioid pain medicine in the past for similar complaint Past Medical History  Diagnosis Date  . PANCREATITIS 05/09/2008  . Cyclic vomiting syndrome   . Hypertension   . Complication of anesthesia     "problems waking up"  . Heart murmur   . Myocardial infarction 07/2007    "they say I've had a silent one; I don't know"  . Pneumonia   . Anemia   . Blood transfusion   . GERD (gastroesophageal reflux disease)   . Anxiety   . Depression   . Smoker   . Marijuana smoker, continuous   . NEUROPATHY, DIABETIC 02/23/2007  . Type II diabetes mellitus   . Diabetic gastroparesis     /e-chart  . Chronic kidney disease (CKD), stage III (moderate)    Past Surgical History  Procedure Laterality Date  . Port-a-cath placement  ~ 2008    right chest; "poor access; frequent hospitalizations"  . Laparotomy and lysis of adhesions    . Abdominal hysterectomy  02/2002  . Cholecystectomy  1980's  . Cardiac catheterization    . Dilation and evacuation  X3  . Left hand surgery    . Cataract extraction w/ intraocular lens  implant, bilateral    . Colonoscopy    . Upper gi endoscopy     Family History  Problem Relation Age of Onset  . Diabetes type II Mother   . Diabetes Mother   . Hypertension  Mother   . Diabetes type II Sister   . Diabetes Sister   . Hypertension Sister   . Hypertension Brother    History  Substance Use Topics  . Smoking status: Current Some Day Smoker -- 0.50 packs/day for 22 years    Types: Cigarettes  . Smokeless tobacco: Never Used  . Alcohol Use: No   OB History   Grav Para Term Preterm Abortions TAB SAB Ect Mult Living                 Review of Systems  Constitutional: Negative.   HENT: Negative.   Respiratory: Negative.   Cardiovascular: Negative.   Gastrointestinal: Positive for vomiting, abdominal pain and diarrhea.       Several loose bowel movements today no blood per rectum. Emesis has been blood streaked  Musculoskeletal: Negative.   Skin: Negative.   Neurological: Negative.   Psychiatric/Behavioral: Negative.   All other systems reviewed and are negative.     Allergies  Bee venom; Acetaminophen; Novolog; and Erythromycin  Home Medications   Prior to Admission medications   Medication Sig Start Date End Date Taking? Authorizing Provider  amLODipine (NORVASC) 10 MG tablet  01/01/14   Historical Provider, MD  carvedilol (COREG) 25 MG tablet Take 1 tablet (25 mg total) by mouth 2 (two) times daily with a meal. 11/15/13   Hilton Sinclair, MD  cyclobenzaprine (FLEXERIL) 5 MG  tablet Take 1 tablet (5 mg total) by mouth 3 (three) times daily as needed. For spasms 04/16/14   Hilton Sinclair, MD  diclofenac sodium (VOLTAREN) 1 % GEL Apply 2 g topically 4 (four) times daily as needed (for pain). 01/30/14   Hilton Sinclair, MD  famotidine (PEPCID) 40 MG tablet Take 1 tablet (40 mg total) by mouth daily. 03/06/14   Lind Covert, MD  hydrochlorothiazide (HYDRODIURIL) 25 MG tablet Take 1 tablet (25 mg total) by mouth daily. 01/22/14   Hilton Sinclair, MD  insulin glargine (LANTUS) 100 UNIT/ML injection Inject 0.2 mLs (20 Units total) into the skin every morning. 11/15/13   Hilton Sinclair, MD  lisinopril  (PRINIVIL,ZESTRIL) 40 MG tablet Take 1 tablet (40 mg total) by mouth every morning. 04/03/14   Hilton Sinclair, MD  LORazepam (ATIVAN) 1 MG tablet Take 1 tablet (1 mg total) by mouth 2 (two) times daily as needed for anxiety. 04/04/14   Hilton Sinclair, MD  omeprazole (PRILOSEC) 20 MG capsule Take 1 capsule (20 mg total) by mouth daily. 01/03/14   Phill Myron, MD  oxyCODONE (OXY IR/ROXICODONE) 5 MG immediate release tablet Take 1 tablet (5 mg total) by mouth daily as needed. 04/04/14   Hilton Sinclair, MD  promethazine (PHENERGAN) 25 MG tablet Take 1 tablet (25 mg total) by mouth every 8 (eight) hours as needed for nausea. 03/06/14   Lind Covert, MD  SUMAtriptan (IMITREX) 50 MG tablet Take 50 mg by mouth every 2 (two) hours as needed for migraine. May repeat in 2 hours if headache persists or recurs.    Historical Provider, MD   BP 210/95  Pulse 112  Temp(Src) 98.1 F (36.7 C) (Oral)  SpO2 100% Physical Exam  Nursing note and vitals reviewed. Constitutional: She appears well-developed and well-nourished. She appears distressed.  Appears uncomfortable Glasgow Coma Score 15  HENT:  Head: Normocephalic and atraumatic.  Eyes: Conjunctivae are normal. Pupils are equal, round, and reactive to light.  Neck: Neck supple. No tracheal deviation present. No thyromegaly present.  Cardiovascular: Regular rhythm.   No murmur heard. Mildly tachycardic  Pulmonary/Chest: Effort normal and breath sounds normal.  Abdominal: Soft. She exhibits no distension. There is tenderness.  Diminished bowel sounds. Tender epigastrium  Musculoskeletal: Normal range of motion. She exhibits no edema and no tenderness.  Neurological: She is alert. Coordination normal.  Skin: Skin is warm and dry. No rash noted.  Psychiatric: She has a normal mood and affect.    ED Course  Procedures (including critical care time) Labs Review Labs Reviewed  CBC WITH DIFFERENTIAL  COMPREHENSIVE METABOLIC PANEL   LIPASE, BLOOD  URINALYSIS, ROUTINE W REFLEX MICROSCOPIC    Imaging Review No results found.   EKG Interpretation None     5:30 PM feels improved after treatment with intravenous fluids, opioids, Ativan. xrays viewed by me  Results for orders placed during the hospital encounter of 05/13/14  CBC WITH DIFFERENTIAL      Result Value Ref Range   WBC 13.6 (*) 4.0 - 10.5 K/uL   RBC 4.20  3.87 - 5.11 MIL/uL   Hemoglobin 13.2  12.0 - 15.0 g/dL   HCT 37.3  36.0 - 46.0 %   MCV 88.8  78.0 - 100.0 fL   MCH 31.4  26.0 - 34.0 pg   MCHC 35.4  30.0 - 36.0 g/dL   RDW 12.5  11.5 - 15.5 %   Platelets 216  150 - 400 K/uL  Neutrophils Relative % 78 (*) 43 - 77 %   Neutro Abs 10.6 (*) 1.7 - 7.7 K/uL   Lymphocytes Relative 16  12 - 46 %   Lymphs Abs 2.2  0.7 - 4.0 K/uL   Monocytes Relative 6  3 - 12 %   Monocytes Absolute 0.8  0.1 - 1.0 K/uL   Eosinophils Relative 0  0 - 5 %   Eosinophils Absolute 0.0  0.0 - 0.7 K/uL   Basophils Relative 0  0 - 1 %   Basophils Absolute 0.0  0.0 - 0.1 K/uL  COMPREHENSIVE METABOLIC PANEL      Result Value Ref Range   Sodium 139  137 - 147 mEq/L   Potassium 3.9  3.7 - 5.3 mEq/L   Chloride 96  96 - 112 mEq/L   CO2 16 (*) 19 - 32 mEq/L   Glucose, Bld 569 (*) 70 - 99 mg/dL   BUN 60 (*) 6 - 23 mg/dL   Creatinine, Ser 3.19 (*) 0.50 - 1.10 mg/dL   Calcium 10.4  8.4 - 10.5 mg/dL   Total Protein 8.7 (*) 6.0 - 8.3 g/dL   Albumin 4.8  3.5 - 5.2 g/dL   AST 15  0 - 37 U/L   ALT 9  0 - 35 U/L   Alkaline Phosphatase 117  39 - 117 U/L   Total Bilirubin 0.6  0.3 - 1.2 mg/dL   GFR calc non Af Amer 16 (*) >90 mL/min   GFR calc Af Amer 19 (*) >90 mL/min  LIPASE, BLOOD      Result Value Ref Range   Lipase 58  11 - 59 U/L  I-STAT TROPOININ, ED      Result Value Ref Range   Troponin i, poc 0.01  0.00 - 0.08 ng/mL   Comment 3           CBG MONITORING, ED      Result Value Ref Range   Glucose-Capillary 480 (*) 70 - 99 mg/dL   Comment 1 Notify RN     Dg Abd Acute  W/chest  05/13/2014   CLINICAL DATA:  Chronic renal disease with history of pancreatitis and neuropathy with vomiting episodes  EXAM: ACUTE ABDOMEN SERIES (ABDOMEN 2 VIEW & CHEST 1 VIEW)  COMPARISON:  DG CHEST 2 VIEW dated 12/18/2013  FINDINGS: The lungs are well-expanded and clear. The cardiopericardial silhouette is normal in size. A Port-A-Cath appliance appears to be in appropriate position. There is no pleural effusion. Within the abdomen the bowel gas pattern is within the limits of normal. There are surgical clips in the gallbladder fossa. There is an approximately 2 mm diameter calcification which projects just lateral to the tip of the left twelfth rib and which is seen on two views. This may reflect an a laterally positioned kidney stone but a non renal location is suspected to be more likely. There are phleboliths within the pelvis. The bony thorax and the lumbar spine and observed portions of the pelvis appear normal.  IMPRESSION: 1. There is no evidence of active cardiopulmonary disease. 2. No acute intra-abdominal abnormality is demonstrated. Specifically there are no findings to suggest gastroenteritis or bowel obstruction.   Electronically Signed   By: David  Martinique   On: 05/13/2014 17:02   MDM  Glucose stabilizer intravenous insulin drip ordered as patient in diabetic ketoacidosis Final diagnoses:  None   spoke with family medicine resident physician Who will arrange for inpatient stay Diagnosis #1 diabetic ketoacidosis 2 abdominal  pain with nausea vomiting and diarrhea 3 cyclic vomiting syndrome #4 renal insufficiency CRITICAL CARE Performed by: Orlie Dakin Total critical care time: 30 minute Critical care time was exclusive of separately billable procedures and treating other patients. Critical care was necessary to treat or prevent imminent or life-threatening deterioration. Critical care was time spent personally by me on the following activities: development of treatment  plan with patient and/or surrogate as well as nursing, discussions with consultants, evaluation of patient's response to treatment, examination of patient, obtaining history from patient or surrogate, ordering and performing treatments and interventions, ordering and review of laboratory studies, ordering and review of radiographic studies, pulse oximetry and re-evaluation of patient's condition.    Orlie Dakin, MD 05/13/14 [phone]

## 2014-05-13 NOTE — ED Notes (Signed)
Glucose - 569 Critical lab value MD notified

## 2014-05-13 NOTE — ED Notes (Signed)
Pharmacy notified about HCTZ and norvasc.

## 2014-05-14 DIAGNOSIS — R1013 Epigastric pain: Secondary | ICD-10-CM | POA: Diagnosis not present

## 2014-05-14 DIAGNOSIS — R319 Hematuria, unspecified: Secondary | ICD-10-CM | POA: Diagnosis not present

## 2014-05-14 DIAGNOSIS — N189 Chronic kidney disease, unspecified: Secondary | ICD-10-CM

## 2014-05-14 DIAGNOSIS — K219 Gastro-esophageal reflux disease without esophagitis: Secondary | ICD-10-CM

## 2014-05-14 DIAGNOSIS — E1110 Type 2 diabetes mellitus with ketoacidosis without coma: Secondary | ICD-10-CM

## 2014-05-14 DIAGNOSIS — R1115 Cyclical vomiting syndrome unrelated to migraine: Secondary | ICD-10-CM | POA: Diagnosis not present

## 2014-05-14 LAB — GLUCOSE, CAPILLARY
GLUCOSE-CAPILLARY: 122 mg/dL — AB (ref 70–99)
GLUCOSE-CAPILLARY: 124 mg/dL — AB (ref 70–99)
GLUCOSE-CAPILLARY: 140 mg/dL — AB (ref 70–99)
GLUCOSE-CAPILLARY: 143 mg/dL — AB (ref 70–99)
GLUCOSE-CAPILLARY: 154 mg/dL — AB (ref 70–99)
GLUCOSE-CAPILLARY: 181 mg/dL — AB (ref 70–99)
GLUCOSE-CAPILLARY: 211 mg/dL — AB (ref 70–99)
GLUCOSE-CAPILLARY: 333 mg/dL — AB (ref 70–99)
Glucose-Capillary: 164 mg/dL — ABNORMAL HIGH (ref 70–99)
Glucose-Capillary: 116 mg/dL — ABNORMAL HIGH (ref 70–99)
Glucose-Capillary: 140 mg/dL — ABNORMAL HIGH (ref 70–99)

## 2014-05-14 LAB — BASIC METABOLIC PANEL
BUN: 48 mg/dL — ABNORMAL HIGH (ref 6–23)
BUN: 41 mg/dL — AB (ref 6–23)
BUN: 37 mg/dL — ABNORMAL HIGH (ref 6–23)
BUN: 32 mg/dL — AB (ref 6–23)
CALCIUM: 8.2 mg/dL — AB (ref 8.4–10.5)
CHLORIDE: 105 meq/L (ref 96–112)
CO2: 19 mEq/L (ref 19–32)
CO2: 21 mEq/L (ref 19–32)
CO2: 20 meq/L (ref 19–32)
CO2: 23 mEq/L (ref 19–32)
CREATININE: 2.1 mg/dL — AB (ref 0.50–1.10)
Calcium: 8.3 mg/dL — ABNORMAL LOW (ref 8.4–10.5)
Calcium: 8.6 mg/dL (ref 8.4–10.5)
Calcium: 8.3 mg/dL — ABNORMAL LOW (ref 8.4–10.5)
Chloride: 108 mEq/L (ref 96–112)
Chloride: 103 mEq/L (ref 96–112)
Chloride: 105 mEq/L (ref 96–112)
Creatinine, Ser: 2.28 mg/dL — ABNORMAL HIGH (ref 0.50–1.10)
Creatinine, Ser: 1.99 mg/dL — ABNORMAL HIGH (ref 0.50–1.10)
Creatinine, Ser: 2.01 mg/dL — ABNORMAL HIGH (ref 0.50–1.10)
GFR calc Af Amer: 28 mL/min — ABNORMAL LOW (ref >90)
GFR calc Af Amer: 31 mL/min — ABNORMAL LOW (ref >90)
GFR calc Af Amer: 33 mL/min — ABNORMAL LOW (ref >90)
GFR calc Af Amer: 32 mL/min — ABNORMAL LOW (ref >90)
GFR calc non Af Amer: 27 mL/min — ABNORMAL LOW (ref >90)
GFR calc non Af Amer: 28 mL/min — ABNORMAL LOW (ref >90)
GFR, EST NON AFRICAN AMERICAN: 24 mL/min — AB (ref >90)
GFR, EST NON AFRICAN AMERICAN: 28 mL/min — AB (ref >90)
GLUCOSE: 160 mg/dL — AB (ref 70–99)
GLUCOSE: 290 mg/dL — AB (ref 70–99)
Glucose, Bld: 180 mg/dL — ABNORMAL HIGH (ref 70–99)
Glucose, Bld: 170 mg/dL — ABNORMAL HIGH (ref 70–99)
POTASSIUM: 4.4 meq/L (ref 3.7–5.3)
POTASSIUM: 4 meq/L (ref 3.7–5.3)
Potassium: 4 mEq/L (ref 3.7–5.3)
Potassium: 4.1 mEq/L (ref 3.7–5.3)
SODIUM: 141 meq/L (ref 137–147)
SODIUM: 135 meq/L — AB (ref 137–147)
Sodium: 138 mEq/L (ref 137–147)
Sodium: 139 mEq/L (ref 137–147)

## 2014-05-14 LAB — MRSA PCR SCREENING: MRSA by PCR: NEGATIVE

## 2014-05-14 LAB — GC/CHLAMYDIA PROBE AMP
CT Probe RNA: NEGATIVE
GC Probe RNA: NEGATIVE

## 2014-05-14 MED ORDER — OXYCODONE HCL 5 MG PO TABS
5.0000 mg | ORAL_TABLET | ORAL | Status: DC | PRN
  Administered 2014-05-14 (×3): 5 mg via ORAL
  Filled 2014-05-14 (×3): qty 1

## 2014-05-14 MED ORDER — FAMOTIDINE 40 MG PO TABS
40.0000 mg | ORAL_TABLET | Freq: Every day | ORAL | Status: DC
  Administered 2014-05-15: 40 mg via ORAL
  Filled 2014-05-14: qty 1

## 2014-05-14 MED ORDER — INSULIN GLARGINE 100 UNIT/ML ~~LOC~~ SOLN
10.0000 [IU] | Freq: Every day | SUBCUTANEOUS | Status: DC
  Administered 2014-05-14: 10 [IU] via SUBCUTANEOUS
  Filled 2014-05-14 (×2): qty 0.1

## 2014-05-14 MED ORDER — INSULIN ASPART 100 UNIT/ML ~~LOC~~ SOLN
0.0000 [IU] | Freq: Three times a day (TID) | SUBCUTANEOUS | Status: DC
Start: 2014-05-15 — End: 2014-05-15
  Administered 2014-05-15: 3 [IU] via SUBCUTANEOUS

## 2014-05-14 MED ORDER — ZOLPIDEM TARTRATE 5 MG PO TABS
5.0000 mg | ORAL_TABLET | Freq: Once | ORAL | Status: AC
  Administered 2014-05-14: 5 mg via ORAL
  Filled 2014-05-14: qty 1

## 2014-05-14 MED ORDER — SODIUM CHLORIDE 0.9 % IJ SOLN
10.0000 mL | INTRAMUSCULAR | Status: DC | PRN
  Administered 2014-05-14 – 2014-05-15 (×3): 10 mL

## 2014-05-14 MED ORDER — INSULIN ASPART 100 UNIT/ML ~~LOC~~ SOLN
0.0000 [IU] | Freq: Three times a day (TID) | SUBCUTANEOUS | Status: DC
  Administered 2014-05-14: 11 [IU] via SUBCUTANEOUS
  Administered 2014-05-14: 2 [IU] via SUBCUTANEOUS

## 2014-05-14 MED ORDER — FAMOTIDINE 20 MG PO TABS: 20.0000 mg | ORAL_TABLET | Freq: Every day | ORAL | Status: DC

## 2014-05-14 NOTE — Progress Notes (Addendum)
Spoke to pharmacist regarding pt's allergy to novolog - pharmacist state to continue to monitor pt. Pt in no s/s of distress at this time. Pt state feet and legs cramp when took novolog in the past.

## 2014-05-14 NOTE — Progress Notes (Signed)
Pt admitted to the unit at 1630. Pt mental status is A&Ox4. Pt oriented to room, staff, and call bell. Skin is intact. Full assessment charted in [facility]. Call bell within reach. Visitor guidelines reviewed w/ pt and/or family.

## 2014-05-14 NOTE — Progress Notes (Signed)
Inpatient Diabetes Program Recommendations  AACE/ADA: New Consensus Statement on Inpatient Glycemic Control (2013)  Target Ranges:  Prepandial:   less than 140 mg/dL      Peak postprandial:   less than 180 mg/dL (1-2 hours)      Critically ill patients:  140 - 180 mg/dL     Patient admitted with Hyperglycemia and acidosis.  Glucose 569 mg/dl, CO2 16 on admission.  Home DM Meds: Lantus 20 units QAM   **Patient currently on IV insulin drip with D5 1/2 NS IVF @150  cc/hour.  AM Glucose this morning 160 mg/dl by BMET however CO2 still a bit low at 19.  Note MD may transition patient off IV insulin drip sometime today.  **Patient well know to the Inpatient DM Program.  Had 7 admissions in 2014 for issues with cyclic vomiting and DM.   MD- Please make sure patient receives as least 80% of her home dose of Lantus 1-2 hours before d/c of IV insulin drip (Lantus 16 units)   Will follow Wyn Quaker RN, MSN, CDE Diabetes Coordinator Inpatient Diabetes Program Team Pager: [phone] (8a-10p)

## 2014-05-14 NOTE — Progress Notes (Signed)
Pt transferred to [id number] via wheelchair w/o telemetry. Pt educated on rationale for moving with teach back. Pt given pain med prior to transfer for complaint of chronic back pain. All belongings within pt possession. Report called and all questions answered.

## 2014-05-14 NOTE — H&P (Signed)
Call Pager [phone] for any questions or notifications regarding this patient  FMTS Attending Admission Note: Sara Neal MD Attending pager:[phone] I  have seen and examined this patient, reviewed their chart. I have discussed this patient with the resident. I agree with the resident's findings, assessment and care plan. 

## 2014-05-14 NOTE — Progress Notes (Signed)
FMTS Attending Note Pt case discussed with resident team and I agree with Dr Parks Ranger' note for today.  [name] [name]

## 2014-05-14 NOTE — Progress Notes (Signed)
Utilization review completed.  

## 2014-05-15 LAB — BASIC METABOLIC PANEL
BUN: 25 mg/dL — ABNORMAL HIGH (ref 6–23)
CALCIUM: 8.8 mg/dL (ref 8.4–10.5)
CO2: 23 mEq/L (ref 19–32)
Chloride: 105 mEq/L (ref 96–112)
Creatinine, Ser: 1.82 mg/dL — ABNORMAL HIGH (ref 0.50–1.10)
GFR, EST AFRICAN AMERICAN: 37 mL/min — AB (ref >90)
GFR, EST NON AFRICAN AMERICAN: 32 mL/min — AB (ref >90)
Glucose, Bld: 156 mg/dL — ABNORMAL HIGH (ref 70–99)
POTASSIUM: 4.2 meq/L (ref 3.7–5.3)
Sodium: 138 mEq/L (ref 137–147)

## 2014-05-15 LAB — GLUCOSE, CAPILLARY
Glucose-Capillary: 192 mg/dL — ABNORMAL HIGH (ref 70–99)
Glucose-Capillary: 137 mg/dL — ABNORMAL HIGH (ref 70–99)

## 2014-05-15 MED ORDER — LISINOPRIL 40 MG PO TABS: 40.0000 mg | ORAL_TABLET | Freq: Every morning | ORAL | Status: DC

## 2014-05-15 MED ORDER — LORAZEPAM 1 MG PO TABS: 1.0000 mg | ORAL_TABLET | Freq: Two times a day (BID) | ORAL | Status: DC | PRN

## 2014-05-15 MED ORDER — PROMETHAZINE HCL 25 MG PO TABS: 25.0000 mg | ORAL_TABLET | Freq: Three times a day (TID) | ORAL | Status: DC | PRN

## 2014-05-15 MED ORDER — INSULIN GLARGINE 100 UNIT/ML ~~LOC~~ SOLN
15.0000 [IU] | Freq: Every day | SUBCUTANEOUS | Status: DC
  Administered 2014-05-15: 15 [IU] via SUBCUTANEOUS
  Filled 2014-05-15: qty 0.15

## 2014-05-15 MED ORDER — HYDROCHLOROTHIAZIDE 25 MG PO TABS: 25.0000 mg | ORAL_TABLET | Freq: Every day | ORAL | Status: DC

## 2014-05-15 MED ORDER — INSULIN GLARGINE 100 UNIT/ML ~~LOC~~ SOLN: 20.0000 [IU] | Freq: Every morning | SUBCUTANEOUS | Status: DC

## 2014-05-15 MED ORDER — OXYCODONE HCL 5 MG PO TABS: 5.0000 mg | ORAL_TABLET | Freq: Every day | ORAL | Status: DC | PRN

## 2014-05-15 MED ORDER — HEPARIN SOD (PORK) LOCK FLUSH 100 UNIT/ML IV SOLN
500.0000 [IU] | INTRAVENOUS | Status: AC | PRN
  Administered 2014-05-15: 500 [IU]

## 2014-05-15 NOTE — Discharge Instructions (Signed)
You were hospitalized due to high blood sugar and found to be in Diabetic Ketoacidosis (DKA), which is a severe condition caused by dehydration and not taking your insulin. Additionally, you were experiencing a Cyclical Vomiting spell, which made your symptoms worse. Overall improved with IV fluids and Insulin, also with regular combination of medicines to help reduce your vomiting. - I have sent a new prescription for your Lantus insulin, you are to inject 20 units daily (it is very important to take your insulin even if you are sick, you may need to take half of the dose if not eating well, but still your body needs insulin. This is important to discuss at your next doctor's appointment) - Also as previously discussed, we would like you to follow-up at the [facility] to see Dr. [name] about adjusting your insulin regimen, and adding one other insulin with meals - We have prescribed a 10 day temporary course of Oxycodone and Ativan to get you to your next appointment with your Primary Doctor, and they can provide you with further refills at that time if needed  If you develop significant worsening abdominal pain, nausea and vomiting, please call [facility] to get worked in, otherwise please return to ED for further evaluation.  Diabetic Ketoacidosis Diabetic ketoacidosis (DKA) is a life-threatening complication of type 1 diabetes. It must be quickly recognized and treated. Treatment requires hospitalization. CAUSES  When there is no insulin in the body, glucose (sugar) cannot be used and the body breaks down fat for energy. When fat breaks down, acids (ketones) build up in the blood. Very high levels of glucose and high levels of acids lead to severe loss of body fluids (dehydration) and other dangerous chemical changes. This stresses your vital organs and can cause coma or death. SYMPTOMS   Tiredness (fatigue).  Weight loss.  Excessive thirst.  Ketones in the  urine.  Lightheadedness.  Fruity or sweet smell on your breath.  Excessive urination.  Visual changes.  Confusion or irritability.  Feeling sick to your stomach (nauseous) or vomiting.  Rapid breathing.  Stomachache or belly (abdominal) pain. DIAGNOSIS  Your caregiver will diagnose DKA based on your history, physical exam, and blood tests. Your caregiver will check if there is another illness present which caused you to go into DKA. Most of this will be done quickly in an emergency room. TREATMENT   Fluid replacement to correct dehydration.  Insulin.  Correction of electrolytes, such as potassium and sodium.  Medicines (antibiotics) that kill germs for infections. PREVENTION  Always take your insulin. Do not skip your insulin injections.  If you are ill, treat yourself quickly. Your body often needs more insulin to fight the illness.  Check your blood glucose regularly.  Check urine ketones if your blood glucose is greater than 240 milligrams per deciliter (mg/dl).  Do not used expired or outdated insulin.  If your blood glucose is high, drink plenty of fluids. This helps flush out ketones. HOME CARE INSTRUCTIONS   If you are ill, follow the advice of your caregiver.  To prevent loss of body fluids (dehydration), drink enough water and fluids to keep your urine clear or pale yellow.  If you cannot eat, alternate between drinking fluids with sugar (soda, juices, flavored gelatin) and salty fluids (broth, bouillon).  If you can eat, follow your usual diet and drink sugar-free liquids (water, diet drinks).  Always take your usual dose of insulin. If you cannot eat, or your glucose is getting too  low, call your caregiver for further instructions.  Continue to monitor your blood or urine ketones every 3 to 4 hours around the clock. Set your alarm clock or have someone wake you up. If you are too sick, have someone test it for you.  Rest and avoid exercise. SEEK  MEDICAL CARE IF:   You have ketones in your urine or your blood glucose is higher than a level your caregiver suggests. You may need extra insulin. Call your caregiver if you need advice on adjusting your insulin.  You cannot drink at least a tablespoon of fluid every 15 to 20 minutes.  You have been throwing up for more than 2 hours.  You have symptoms of DKA:  Fruity smelling breath.  Breathing faster or slower.  Becoming very sleepy. SEEK IMMEDIATE MEDICAL CARE IF:   You have signs of dehydration:  Decreased urination.  Increased thirst.  Dry skin and mouth.  Lightheadedness.  Your blood glucose is very high (as advised by your caregiver) twice in a row.  You or your child has an oral temperature above 102 F (38.9 C), not controlled by medicine.  You pass out.  You have chest pain and/or trouble breathing.  You have a sudden, severe headache.  You have sudden weakness in one arm and/or one leg.  You have sudden difficulty speaking and/or swallowing.  You develop vomiting and/or diarrhea that is getting worse after 3 to 4 hours.  You have abdominal pain. MAKE SURE YOU:   Understand these instructions.  Will watch your condition.  Will get help right away if you are not doing well or get worse. Document Released: 12/10/2000 Document Revised: 03/06/2012 Document Reviewed: 06/18/2009 [facility] Patient Information 2014 [place], [place].

## 2014-05-15 NOTE — Progress Notes (Signed)
On call physician paged for pt's request of sleep medicine.

## 2014-05-15 NOTE — Discharge Summary (Signed)
[facility] Discharge Summary  Patient name: [name] [name] Medical record number: [id number] Date of birth: April 03, 1966 Age: 48 y.o. Gender: female Date of Admission: 05/13/2014  Date of Discharge: 05/15/14 Admitting Physician: Dickie La, MD  Primary Care Provider: Conni Slipper, MD Consultants: None  Indication for Hospitalization: DKA, vomiting  Discharge Diagnoses/Problem List:  DKA, secondary to uncontrolled T2DM - Resolved Diabetes Type 2, Uncontrolled Cyclic Vomiting Syndrome, suspected secondary to hx marijuana abuse HTN CKD, Stage III Vaginal Discharge  Disposition: Home  Discharge Condition: Stable  Discharge Exam: General: well-appearing, comfortable, NAD  HEENT: sclera clear, imp moist MM  Cardiovascular: RRR, no murmurs  Respiratory: CTAB, nml effort  Abdomen: soft, minimal epigastric abd tenderness (significantly improved) no rebound or guarding, +BS in all quads  Extremities: moves all ext, no edema, non-tender, +2 peripheral pulses  Neuro: awake, alert, oriented, grossly intact  Brief Hospital Course:  [name] is a 48 y.o. female presenting with hyperglycemia with acidosis. PMH is significant for cyclic vomiting, protein-calorie malnutrition, uncontrolled Type II DM, esophagitis, CKD, HTN and Marijuana abuse  # DKA, secondary to uncontrolled T2DM - Resolved # Diabetes Type 2, Uncontrolled Initially admitted to SDU for DKA with CBG 569, bicarb 16, AG 27, VBG with nml pH. Suspected initial trigger d/t recent non-compliance with missed insulin doses x 2-3 days. Hemodynamically stable, afebrile, with c/o abd pain and n/v. Treated with IV rehydration with K+ repletion per protocol, placed on Glucose Stabilizer x 12 hours with overall good response, closed AG, normalized BMET, transitioned with Lantus 10u (half daily dose), CBGs remained < 140. Clinically improved within 24 hours, vomiting resolved advanced to regular diet.  Prior to discharge, increased to Lantus 15u daily (resume 20u daily on discharge) discussed plan to consider adding mealtime insulin coverage, recommend follow-up in Pharmacy Clinic (as patient well known to Dr. [name]).  # Cyclic Vomiting Syndrome, suspected secondary to hx marijuana abuse Patient with long standing h/o cyclic vomiting, with suspected trigger of marijuana use (Last use 2 days prior to admission), additionally hx moderate delayed gastric emptying and prior GI work-up. Treatment initiated with previous successful regimen of Phenergan, Ativan, Pepcid, Morphine (transitioned to Oxy IR PO), noted that Zofran and Morphine worsened nausea. Continued to improve with resolved vomiting. Advanced diet to carb modified prior to discharge, tolerated without n/v. Patient out of Ativan and Oxy IR, reviewed chart last refilled by PCP on 04/04/14, provided temporary 10 day supply to make it to next apt.  # Hypertension - Improved  - Suspect recent significant elevated BPs due to recent missed anti-HTN doses. On admission, significantly high BP at 210/95, s/p pain meds, anti-emetics, anti-HTN, since continued improvement to 160s/80s to 120s/70s. Continued on home regimen: Amlodipine 10mg  daily (refused), Lisinopril 40mg  daily, HCTZ 25mg  daily, Coreg 25mg  BID.  # AKI in setting of CKD, Stage III - Followed by Nephrology last seen 01/2014, CKD secondary to long hx DM2 and HTN. Presented with elevated Cr to 3.19, continued downtrend with improvement following IV rehydration, returned back to baseline 1.7 to 2.0 by time of discharge.  # Vaginal discharge  Complained of discharge after possible STD exposure. Pelvic exam completed on admission. Pelvic exam without concern for PID or obvious infection. Wet prep (negative), GC/Chlamydia cultures (negative). UA (not concerning for UTI).  Issues for Follow Up:  1. DM2 Management - Resumed Lantus 20u daily, recommend f/u with Pharmacy Clinic, discussed adding  mealtime insulin for improved control 2. Cyclic Vomiting Syndrome -  Provided 10 day rx for Ativan (#20, 0 refill), Oxy IR (#10, 0 refill), also refill Phenergan 3. F/u Vaginal discharge 4. Central Venous Port Access - Unclear reason for initial placement (reported 5 yrs ago?), please review  Significant Procedures: none  Significant Labs and Imaging:   Recent Labs Lab 05/13/14 1543  WBC 13.6*  HGB 13.2  HCT 37.3  PLT 216    Recent Labs Lab 05/13/14 1543  05/13/14 2156 05/14/14 0123 05/14/14 0845 05/14/14 1330 05/14/14 2000  NA 139  < > 144 141 138 135* 139  K 3.9  < > 4.7 4.4 4.0 4.1 4.0  CL 96  < > 109 108 105 103 105  CO2 16*  < > 17* 19 21 20 23   GLUCOSE 569*  < > 244* 160* 180* 290* 170*  BUN 60*  < > 55* 48* 41* 37* 32*  CREATININE 3.19*  < > 2.55* 2.28* 2.10* 1.99* 2.01*  CALCIUM 10.4  < > 8.7 8.3* 8.6 8.2* 8.3*  ALKPHOS 117  --   --   --   --   --   --   AST 15  --   --   --   --   --   --   ALT 9  --   --   --   --   --   --   ALBUMIN 4.8  --   --   --   --   --   --   < > = values in this interval not displayed.  POCT I-stat Trop - 0.01  UA - >1000 glucose, 15 ketones, neg nitrite, neg leuk, neg hgb, WBC 0-2, rare squam, few bact  UDS - THC (positive)  Wet Prep - No trich / yeast / clue cells. Few WBCs   5/18 Acute Abd 2v + CXR 1v  IMPRESSION:  1. There is no evidence of active cardiopulmonary disease.  2. No acute intra-abdominal abnormality is demonstrated.  Specifically there are no findings to suggest gastroenteritis or  bowel obstruction  5/18 EKG  Sinus tachycardia, HR 100, with LAE, old anterior infarct. No acute ST-T wave changes. Compared to prior EKG 01/03/14   Results/Tests Pending at Time of Discharge: none  Discharge Medications:    Medication List         amLODipine 10 MG tablet  Commonly known as:  NORVASC     carvedilol 25 MG tablet  Commonly known as:  COREG  Take 1 tablet (25 mg total) by mouth 2 (two) times daily with a  meal.     cyclobenzaprine 5 MG tablet  Commonly known as:  FLEXERIL  Take 1 tablet (5 mg total) by mouth 3 (three) times daily as needed. For spasms     diclofenac sodium 1 % Gel  Commonly known as:  VOLTAREN  Apply 2 g topically 4 (four) times daily as needed (for pain).     famotidine 40 MG tablet  Commonly known as:  PEPCID  Take 1 tablet (40 mg total) by mouth daily.     hydrochlorothiazide 25 MG tablet  Commonly known as:  HYDRODIURIL  Take 1 tablet (25 mg total) by mouth daily.     insulin glargine 100 UNIT/ML injection  Commonly known as:  LANTUS  Inject 0.2 mLs (20 Units total) into the skin every morning.     lisinopril 40 MG tablet  Commonly known as:  PRINIVIL,ZESTRIL  Take 40 mg by mouth every morning.  LORazepam 1 MG tablet  Commonly known as:  ATIVAN  Take 1 tablet (1 mg total) by mouth 2 (two) times daily as needed for anxiety.     oxyCODONE 5 MG immediate release tablet  Commonly known as:  Oxy IR/ROXICODONE  Take 1 tablet (5 mg total) by mouth daily as needed.     promethazine 25 MG tablet  Commonly known as:  PHENERGAN  Take 1 tablet (25 mg total) by mouth every 8 (eight) hours as needed for nausea.     SUMAtriptan 50 MG tablet  Commonly known as:  IMITREX  Take 50 mg by mouth every 2 (two) hours as needed for migraine. May repeat in 2 hours if headache persists or recurs.        Discharge Instructions: Please refer to Patient Instructions section of EMR for full details.  Patient was counseled important signs and symptoms that should prompt return to medical care, changes in medications, dietary instructions, activity restrictions, and follow up appointments.   Follow-Up Appointments:     Follow-up Information   Follow up with Conni Slipper, MD On 05/24/2014. (at 3:45pm, for hospital follow-up appointment, refills)    Specialty:  Family Medicine   Contact information:   [place] [address] [phone]        Follow up with [facility] (Dr. [name]). Schedule an appointment as soon as possible for a visit in 2 weeks. (Discuss insulin and diabetes management. Please call to schedule this appointment with Dr. [name] (currently schedule unavailable for June))    Contact information:   [place] [address] [place], DO 05/15/2014, 7:00PM PGY-1, [place]

## 2014-05-15 NOTE — Progress Notes (Signed)
FMTS Attending Note Patient seen and examined by me, discussed with resident team and I agree with assessment and plan for discharge as documented. Patient reports no nausea or abd pain, is eager for discharge and is stable with her home insulin.  Dalbert Mayotte, MD

## 2014-05-15 NOTE — Progress Notes (Signed)
Nsg Discharge Note  Admit Date:  05/13/2014 Discharge date: 05/15/2014   [name] [name] to be D/C'd Home per MD order.  AVS completed.  Copy for chart, and copy for patient signed, and dated. Patient/caregiver able to verbalize understanding.  Discharge Medication:   Medication List         amLODipine 10 MG tablet  Commonly known as:  NORVASC     carvedilol 25 MG tablet  Commonly known as:  COREG  Take 1 tablet (25 mg total) by mouth 2 (two) times daily with a meal.     cyclobenzaprine 5 MG tablet  Commonly known as:  FLEXERIL  Take 1 tablet (5 mg total) by mouth 3 (three) times daily as needed. For spasms     diclofenac sodium 1 % Gel  Commonly known as:  VOLTAREN  Apply 2 g topically 4 (four) times daily as needed (for pain).     famotidine 40 MG tablet  Commonly known as:  PEPCID  Take 1 tablet (40 mg total) by mouth daily.     hydrochlorothiazide 25 MG tablet  Commonly known as:  HYDRODIURIL  Take 1 tablet (25 mg total) by mouth daily.     insulin glargine 100 UNIT/ML injection  Commonly known as:  LANTUS  Inject 0.2 mLs (20 Units total) into the skin every morning.     lisinopril 40 MG tablet  Commonly known as:  PRINIVIL,ZESTRIL  Take 40 mg by mouth every morning.     lisinopril 40 MG tablet  Commonly known as:  PRINIVIL,ZESTRIL  Take 1 tablet (40 mg total) by mouth every morning.     LORazepam 1 MG tablet  Commonly known as:  ATIVAN  Take 1 tablet (1 mg total) by mouth 2 (two) times daily as needed for anxiety.     oxyCODONE 5 MG immediate release tablet  Commonly known as:  Oxy IR/ROXICODONE  Take 1 tablet (5 mg total) by mouth daily as needed.     promethazine 25 MG tablet  Commonly known as:  PHENERGAN  Take 1 tablet (25 mg total) by mouth every 8 (eight) hours as needed for nausea.     SUMAtriptan 50 MG tablet  Commonly known as:  IMITREX  Take 50 mg by mouth every 2 (two) hours as needed for migraine. May repeat in 2 hours if headache persists  or recurs.        Discharge Assessment: Filed Vitals:   05/15/14 0925  BP: 134/73  Pulse: 112  Temp:   Resp:    Skin clean, dry and intact without evidence of skin break down, no evidence of skin tears noted. IV catheter discontinued intact. Site without signs and symptoms of complications - no redness or edema noted at insertion site, patient denies c/o pain - only slight tenderness at site.  Dressing with slight pressure applied.  D/c Instructions-Education: Discharge instructions given to patient/family with verbalized understanding. D/c education completed with patient/family including follow up instructions, medication list, d/c activities limitations if indicated, with other d/c instructions as indicated by MD - patient able to verbalize understanding, all questions fully answered. Patient instructed to return to ED, call 911, or call MD for any changes in condition.  Patient escorted via [place], and D/C home via private auto.  Dayle Points, RN 05/15/2014 12:39 PM

## 2014-05-15 NOTE — Care Management Note (Signed)
    Page 1 of 1   05/15/2014     2:37:21 PM CARE MANAGEMENT NOTE 05/15/2014  Patient:  [name] [name], [name] [name]   Account Number:  [id number]  Date Initiated:  05/15/2014  Documentation initiated by:  Tomi Bamberger  Subjective/Objective Assessment:   dx vomiting  admit- lives alone.     Action/Plan:   Anticipated DC Date:  05/15/2014   Anticipated DC Plan:  [place]  CM consult      Choice offered to / List presented to:             Status of service:  Completed, signed off Medicare Important Message given?   (If response is "NO", the following Medicare IM given date fields will be blank) Date Medicare IM given:   Date Additional Medicare IM given:    Discharge Disposition:  HOME/SELF CARE  Per UR Regulation:  Reviewed for med. necessity/level of care/duration of stay  If discussed at [place] of Stay Meetings, dates discussed:    Comments:

## 2014-05-15 NOTE — Progress Notes (Signed)
Family Medicine Teaching Service Daily Progress Note Intern Pager: [phone]  Patient name: [name] [name] Medical record number: [id number] Date of birth: 1966/09/30 Age: 48 y.o. Gender: female  Primary Care Provider: Conni Slipper, MD Consultants: None Code Status: DNR - confirmed on admission  Pt Overview and Major Events to Date:  5/18 - Admitted to SDU, DKA on glucose stabilizer 5/19 - AG improved 18-->14-->12, CBGs < 140s, n/v improved. Transition Glucose Stabilizer to Lantus 10u +SSI 5/20 - AG remains closed 12 -->11, CBGs improved 140-180 (high 333), resolved n/v  Assessment and Plan: [name] [name] is a 48 y.o. female presenting with hyperglycemia with acidosis. PMH is significant for cyclic vomiting, protein-calorie malnutrition, uncontrolled Type II DM, esophagitis, CKD, HTN and Marijuana abuse  # DKA secondary to uncontrolled T2DM - Resolved - Initially admitted to SDU for DKA with CBG 569, bicarb 16, AG 27, VBG with nml pH. Suspected initial trigger d/t recent non-compliance with missed insulin doses x 2-3 days - s/p 2L NS bolus, continued IVF rehydration protocol - s/p Glucose Stabilizer (DC'd after closed AG x 2, Lantus started 1 hr prior to DC glucose stabilizer) - s/p KCl 10 mEq IV x 2 - AG closed x2, continue daily BMET  # Uncontrolled T2DM - Recent HgbA1c 9.4 (04/04/14), CBGs improved 140-180 (high 333) - Increased Lantus to 15u daily (note home dose 20u daily) with improved PO, carb modified diet - Moderate SSI, without meal coverage - CBG monitoring q 4 hr qACs  # Hypertension - Improved - Suspect recent significant elevated BPs due to recent missed anti-HTN doses. On admission, significantly high BP at 210/95, s/p pain meds, anti-emetics, anti-HTN, since continued improvement to 160s/80s --> 120s/70s. - Continue home regimen: Amlodipine 10mg  daily (refused), Lisinopril 40mg  daily, HCTZ 25mg  daily, Coreg 25mg  BID,  - Hydralazine IV 5mg  q 4 hr PRN orders with  parameters ([id number] / [id number])  # Cyclic vomiting Syndrome: patient with long standing h/o cyclic vomiting. Last marijuana use Saturday. Pateint per office notes has had gastric empty study in 2012 with moderate delay in gastric emptying. She again was seen by [facility] in 2014 without note to treat as esophagitis/gastritis and no furhter endoscopy warranted. Regimen that has been proven to work on prior admissions is Zofran, phenergan, ativan and pain medications.  - Zofran, phenergan, ativan - Oxy IR 5mg  q 4 hr PRN for abd pain (morphine inc nausea, allergy to tylenol) - (received x 3 doses in 24 hours) - Pepcid 40mg  PO daily (s/p 20mg  IV on admission) - advanced to carb modified diet - Continue to monitor and de-escalate treatment when appropriate - Chart review, last rx 1 month supply of Ativan (#60, 0 refills) and Oxy IR (#30. 0 refills) by PCP on 04/04/14, will provide 1-2 week supply on discharge from hospital, recommend f/u with PCP for further refills.  # Mild Leukocytosis: - On admission WBC 13.6, neutrophil predominant. Possibly could be from cyclic vomiting stress. Patient with GI symptoms including diarrhea for two days. White count could be from Gastroenteritis or virus. CXR is normal. - Clinically stable, afebrile, no further work-up at this time.   # Vaginal discharge - complaints of discharge after possible STD exposure. Pelvic exam completed with wet prep and gonorrhea/chlamydia. Pelvic exam without concern for PID or obvious infection.  - Wet prep normal  - G/C vaginal cuff cultures (negative) - 05/13/14 - UA neg  FEN/GI:  - Carb modified diet - SLIV  Prophylaxis: Heparin SQ  Disposition: Admitted  to FPTS, SDU for Glucose Stabilizer, currently with closed AG and discontinued Glucose Stabilizer, anticipate to be transferred to regular floor to continue management. Anticipate to discharge to home today, since resolved N/V, tolerating PO.  Subjective: Resting, feels much better  today. No vomiting yesterday, tolerating regular diet, occasional "queasy spells" resolved. Significantly improved abdominal pain. Tolerated ambulation, had BM yesterday, voiding. Ready to go home today, and plans to return to work tomorrow. Reports out of meds Ativan, Oxycodone (last given 1 month supply at last OV).   Objective: Temp:  [98.4 F (36.9 C)-98.7 F (37.1 C)] 98.4 F (36.9 C) (05/20 0419) Pulse Rate:  [54-56] 54 (05/20 0419) Resp:  [16-18] 16 (05/20 0419) BP: (116-135)/(49-75) 135/75 mmHg (05/20 0419) SpO2:  [99 %-100 %] 99 % (05/20 0419) Physical Exam: General: well-appearing, comfortable, NAD HEENT: sclera clear, imp moist MM Cardiovascular: RRR, no murmurs Respiratory: CTAB, nml effort Abdomen: soft, minimal epigastric abd tenderness (significantly improved) no rebound or guarding, +BS in all quads Extremities: moves all ext, no edema, non-tender, +2 peripheral pulses Neuro: awake, alert, oriented, grossly intact  Laboratory:  Recent Labs Lab 05/13/14 1543  WBC 13.6*  HGB 13.2  HCT 37.3  PLT 216    Recent Labs Lab 05/13/14 1543  05/14/14 0845 05/14/14 1330 05/14/14 2000  NA 139  < > 138 135* 139  K 3.9  < > 4.0 4.1 4.0  CL 96  < > 105 103 105  CO2 16*  < > 21 20 23   BUN 60*  < > 41* 37* 32*  CREATININE 3.19*  < > 2.10* 1.99* 2.01*  CALCIUM 10.4  < > 8.6 8.2* 8.3*  PROT 8.7*  --   --   --   --   BILITOT 0.6  --   --   --   --   ALKPHOS 117  --   --   --   --   ALT 9  --   --   --   --   AST 15  --   --   --   --   GLUCOSE 569*  < > 180* 290* 170*  < > = values in this interval not displayed. ABG    Component Value Date/Time   HCO3 20.5 05/13/2014 1742   TCO2 22 05/13/2014 1742   ACIDBASEDEF 6.0* 05/13/2014 1742   O2SAT 63.0 05/13/2014 1742   POCT I-stat Trop - 0.01  UA - >1000 glucose, 15 ketones, neg nitrite, neg leuk, neg hgb, WBC 0-2, rare squam, few bact UDS - THC (positive) Wet Prep - No trich / yeast / clue cells. Few WBCs    Imaging/Diagnostic Tests:  5/18 Acute Abd 2v + CXR 1v IMPRESSION:  1. There is no evidence of active cardiopulmonary disease.  2. No acute intra-abdominal abnormality is demonstrated.  Specifically there are no findings to suggest gastroenteritis or  bowel obstruction  5/18 EKG Sinus tachycardia, HR 100, with LAE, old anterior infarct. No acute ST-T wave changes. Compared to prior EKG 01/03/14  Nobie Putnam, DO 05/15/2014, 8:16 AM PGY-1, [place] Intern pager: [phone], text pages welcome

## 2014-05-16 ENCOUNTER — Ambulatory Visit: Payer: Medicare Other | Admitting: Family Medicine

## 2014-05-16 NOTE — Discharge Summary (Signed)
FMTS Attending Note Patient seen and examined by me on the day of discharge, discussed with the resident team and I agree with Dr Parks Ranger' assessment and plan for discharge according to today's note.  Dalbert Mayotte, MD

## 2014-05-24 ENCOUNTER — Encounter: Payer: Self-pay | Admitting: Family Medicine

## 2014-05-24 ENCOUNTER — Ambulatory Visit (INDEPENDENT_AMBULATORY_CARE_PROVIDER_SITE_OTHER): Payer: Medicare Other | Admitting: Family Medicine

## 2014-05-24 VITALS — BP 91/60 | HR 75 | Temp 98.2°F | Wt 165.0 lb

## 2014-05-24 DIAGNOSIS — E119 Type 2 diabetes mellitus without complications: Secondary | ICD-10-CM | POA: Diagnosis present

## 2014-05-24 DIAGNOSIS — N183 Chronic kidney disease, stage 3 unspecified: Secondary | ICD-10-CM

## 2014-05-24 DIAGNOSIS — R1115 Cyclical vomiting syndrome unrelated to migraine: Secondary | ICD-10-CM

## 2014-05-24 DIAGNOSIS — N898 Other specified noninflammatory disorders of vagina: Secondary | ICD-10-CM | POA: Insufficient documentation

## 2014-05-24 DIAGNOSIS — E1365 Other specified diabetes mellitus with hyperglycemia: Secondary | ICD-10-CM | POA: Diagnosis not present

## 2014-05-24 DIAGNOSIS — E1322 Other specified diabetes mellitus with diabetic chronic kidney disease: Secondary | ICD-10-CM

## 2014-05-24 DIAGNOSIS — E1329 Other specified diabetes mellitus with other diabetic kidney complication: Secondary | ICD-10-CM

## 2014-05-24 DIAGNOSIS — M2550 Pain in unspecified joint: Secondary | ICD-10-CM | POA: Diagnosis not present

## 2014-05-24 DIAGNOSIS — M25519 Pain in unspecified shoulder: Secondary | ICD-10-CM

## 2014-05-24 DIAGNOSIS — IMO0002 Reserved for concepts with insufficient information to code with codable children: Secondary | ICD-10-CM

## 2014-05-24 DIAGNOSIS — M549 Dorsalgia, unspecified: Secondary | ICD-10-CM | POA: Diagnosis not present

## 2014-05-24 DIAGNOSIS — F339 Major depressive disorder, recurrent, unspecified: Secondary | ICD-10-CM | POA: Diagnosis not present

## 2014-05-24 DIAGNOSIS — M67919 Unspecified disorder of synovium and tendon, unspecified shoulder: Secondary | ICD-10-CM | POA: Diagnosis not present

## 2014-05-24 LAB — GLUCOSE, CAPILLARY: Glucose-Capillary: 265 mg/dL — ABNORMAL HIGH (ref 70–99)

## 2014-05-24 MED ORDER — LORAZEPAM 1 MG PO TABS: 1.0000 mg | ORAL_TABLET | Freq: Two times a day (BID) | ORAL | Status: DC | PRN

## 2014-05-24 MED ORDER — OXYCODONE HCL 5 MG PO TABS: 5.0000 mg | ORAL_TABLET | Freq: Every day | ORAL | Status: DC | PRN

## 2014-05-24 NOTE — Progress Notes (Signed)
Patient ID: [name] [name], female   DOB: [birth year], 48 y.o.   MRN: [id number] Subjective:   CC: Hospital follow up  HPI:   Hospital follow up for DKA Patient recently admitted in DKA. She had not been taking her lantus due to feeling nauseated and not eating. She was not checking blood sugars. On discharge, she was restarted on lantus 20 units daily. She has not checked blood sugars but has been feeling better.  Cyclic vomiting syndrome Patient continues to resist the idea that marijuana causes symptoms. She was previously a heavy drinker though she has not been for 5 years. She takes ativan once daily since leaving hospital, though she reportedly feels lots better with no current nausea/vomiting in 9 days. She only takes it when she feels nauseated. Phenergan helps with ativan, not alone, per pt. Has about 9 more ativan tablets. Denies fevers, chills, abdominal pain.  Pain In back, legs, feet, stomach, and shoulder. Saw Sports Med for shoulder, dx'ed with rotator cuff syndrome. Some of symptoms in legs/feet are burning pain. Has tried neurontin which made stomach feel weird. Never tried lyrica. Wants oxycodone around to help her sleep. Has 2 pills left.  Vaginal discharge Still discharge, but denies odor, itching, pain, burning, fevers, or chills and is not bothered by it.   Review of Systems - Per HPI.   SH: -Thinks she may be losing her job due to unpredictability of cyclic vomiting. - Alcohol - Used to drink a lot. Quit 5 years ago. - Marijuana regularly.   Objective:  Physical Exam BP 91/60  Pulse 75  Temp(Src) 98.2 F (36.8 C) (Oral)  Wt 165 lb (74.844 kg) GEN: NAD HEENT: Atraumatic, normocephalic, neck supple, EOMI, sclera clear  PULM: normal effort ABD: Soft, nondistended SKIN: No rash or cyanosis; warm and well-perfused EXTR: Moves all extremities fully, including right shoulder which she is moving easily today PSYCH: Mood and affect mildly depressed, normal rate and  volume of speech NEURO: Awake, alert, no focal deficits grossly, normal speech    Assessment:     [name] [name] is a 48 y.o. female with h/o cyclic vomiting syndrome and diabetes here for hospital follow up.    Plan:     DKA Hospitalization Patient has been feeling better. CBG today 265, random.  - Check blood sugars regularly. - Continue lantus 20 units daily and cut back on popsicles. - Call to make appt with Dr [name] (pharmacy clinic) for early June. - Return precautions for DKA reviewed. - No longer completely stop lantus. If poor PO, check blood sugar and come to clinic or call emergency line (number provided) for advice. - F/u with me early July for A1c.  Cyclic vomiting syndrome Symptoms improved.  - Rx'ed Ativan #15 tabs (in addition to 9 at home). Use only when absolutely necessary. - Discussed stopping marijuana; pt resistant but stated she will still try. - Stay hydrated. - Use phenergan PRN.  Pain Chronic, Partly neuropathic in arms/legs, also with back and stomach symptoms. No red flag symptoms.  - Discussed that narcotics are not a good long-term solution. - Rx'ed oxycodone #15 to use prn, only for severe pain. - UDS today. - Emphasized that goal is function, not complete relief.  - Encouraged activity. - Aspercreme worked well. Needs more but can't afford. Try bengay if cheaper. - Schedule f/u with [facility].  Vaginal discharge Likely mild BV, though not bothersome to pt.  - Will hold off on further tx for now. -  Return precautions reviewed.  # Health Maintenance: Not discussed.  Follow-up: Follow up in June with Dr [name] and July with me for DM f/u. - Did not discuss: Central Venous Port Access - At f/u, discuss why placed and consider removal.    Hilton Sinclair, MD [place]

## 2014-05-24 NOTE — Assessment & Plan Note (Signed)
Chronic, Partly neuropathic in arms/legs, also with back and stomach symptoms. No red flag symptoms.  - Discussed that narcotics are not a good long-term solution. - Rx'ed oxycodone #15 to use prn, only for severe pain. - UDS today. - Emphasized that goal is function, not complete relief.  - Encouraged activity. - Aspercreme worked well. Needs more but can't afford. Try bengay if cheaper. - Schedule f/u with [facility].

## 2014-05-24 NOTE — Assessment & Plan Note (Signed)
See a/p for DKA.

## 2014-05-24 NOTE — Patient Instructions (Addendum)
Good to see you.  Call the hospital [phone]) and they can get you in touch with family practice resident on call if needed.  For diabetes, keep track of blood sugar. Make an appointment with Dr [name] (pharmacy clinic) for June. Follow up with me in July for A1c recheck. We are checking your blood sugar today.  For your vomiting,  Continue trying to quit marijuana. If you are quit long-term, that is the only way we will see if you feel better. It is our only tool besides medications that mask symptoms. I am prescribing ativan and oxycodone for short-term supply. Do not take if not needed. Stay hydrated.  Goal for pain is function, not relief. Continue to stay active and stretch your neck. Follow up with Dr [name] early June. You can try BenGay if Aspercreme is too expensive.  [name] Sinclair, MD  Diet Recommendations for Diabetes   Starchy (carb) foods include: Bread, rice, pasta, potatoes, corn, crackers, bagels, muffins, all baked goods.  (Fruits, milk, and yogurt also have carbohydrate, but most of these foods will not spike your blood sugar as the starchy foods will.)  A few fruits do cause high blood sugars; use small portions of bananas (limit to 1/2 at a time), grapes, and most tropical fruits.    Protein foods include: Meat, fish, poultry, eggs, dairy foods, and beans such as pinto and kidney beans (beans also provide carbohydrate).   1. Eat at least 3 meals and 1-2 snacks per day. Never go more than 4-5 hours while awake without eating.  2. Limit starchy foods to TWO per meal and ONE per snack. ONE portion of a starchy  food is equal to the following:   - ONE slice of bread (or its equivalent, such as half of a hamburger bun).   - 1/2 cup of a "scoopable" starchy food such as potatoes or rice.   - 15 grams of carbohydrate as shown on food label.  3. Both lunch and dinner should include a protein food, a carb food, and vegetables.   - Obtain twice as many veg's as  protein or carbohydrate foods for both lunch and dinner.   - Fresh or frozen veg's are best.   - Try to keep frozen veg's on hand for a quick vegetable serving.    4. Breakfast should always include protein.

## 2014-05-24 NOTE — Assessment & Plan Note (Signed)
Likely mild BV, though not bothersome to pt.  - Will hold off on further tx for now. - Return precautions reviewed.

## 2014-05-24 NOTE — Assessment & Plan Note (Signed)
Symptoms improved.  - Rx'ed Ativan #15 tabs (in addition to 9 at home). Use only when absolutely necessary. - Discussed stopping marijuana; pt resistant but stated she will still try. - Stay hydrated. - Use phenergan PRN.

## 2014-05-24 NOTE — Assessment & Plan Note (Signed)
Patient has been feeling better. CBG today 265, random.  - Check blood sugars regularly. - Continue lantus 20 units daily and cut back on popsicles. - Call to make appt with Dr [name] (pharmacy clinic) for early June. - Return precautions for DKA reviewed. - No longer completely stop lantus. If poor PO, check blood sugar and come to clinic or call emergency line (number provided) for advice. - F/u with me early July for A1c.

## 2014-05-25 LAB — DRUG SCR UR, PAIN MGMT, REFLEX CONF
AMPHETAMINE SCRN UR: NEGATIVE
BENZODIAZEPINES.: NEGATIVE
Barbiturate Quant, Ur: NEGATIVE
Cocaine Metabolites: NEGATIVE
Creatinine,U: 144.38 mg/dL
METHADONE: NEGATIVE
OPIATES: NEGATIVE
Phencyclidine (PCP): NEGATIVE
Propoxyphene: NEGATIVE

## 2014-05-30 LAB — CANNABANOIDS (GC/LC/MS), URINE: THC-COOH UR CONFIRM: 131 ng/mL — AB (ref <5)

## 2014-06-08 ENCOUNTER — Telehealth: Payer: Self-pay | Admitting: Family Medicine

## 2014-06-08 NOTE — Telephone Encounter (Signed)
Patient called reporting exacerbation of cyclic vomiting syndrome. Patient then requested Lorazepam.  I offered zofran in addition to her home phenergan and she refused. I advised her to go to Urgent care if she continued to have nausea/vomiting. Patient in agreement.

## 2014-06-09 ENCOUNTER — Observation Stay (HOSPITAL_COMMUNITY)
Admission: EM | Admit: 2014-06-09 | Discharge: 2014-06-10 | Disposition: A | Discharge status: Home / Self Care | Payer: Medicare Other | Attending: Family Medicine | Admitting: Family Medicine

## 2014-06-09 ENCOUNTER — Encounter (HOSPITAL_COMMUNITY): Payer: Self-pay | Admitting: Emergency Medicine

## 2014-06-09 DIAGNOSIS — Z794 Long term (current) use of insulin: Secondary | ICD-10-CM | POA: Insufficient documentation

## 2014-06-09 DIAGNOSIS — K5900 Constipation, unspecified: Secondary | ICD-10-CM | POA: Diagnosis not present

## 2014-06-09 DIAGNOSIS — E1142 Type 2 diabetes mellitus with diabetic polyneuropathy: Secondary | ICD-10-CM | POA: Insufficient documentation

## 2014-06-09 DIAGNOSIS — F17200 Nicotine dependence, unspecified, uncomplicated: Secondary | ICD-10-CM | POA: Insufficient documentation

## 2014-06-09 DIAGNOSIS — D649 Anemia, unspecified: Secondary | ICD-10-CM | POA: Diagnosis not present

## 2014-06-09 DIAGNOSIS — I10 Essential (primary) hypertension: Secondary | ICD-10-CM | POA: Diagnosis not present

## 2014-06-09 DIAGNOSIS — F329 Major depressive disorder, single episode, unspecified: Secondary | ICD-10-CM | POA: Diagnosis not present

## 2014-06-09 DIAGNOSIS — F411 Generalized anxiety disorder: Secondary | ICD-10-CM | POA: Insufficient documentation

## 2014-06-09 DIAGNOSIS — I129 Hypertensive chronic kidney disease with stage 1 through stage 4 chronic kidney disease, or unspecified chronic kidney disease: Secondary | ICD-10-CM | POA: Insufficient documentation

## 2014-06-09 DIAGNOSIS — R1115 Cyclical vomiting syndrome unrelated to migraine: Secondary | ICD-10-CM | POA: Diagnosis not present

## 2014-06-09 DIAGNOSIS — I252 Old myocardial infarction: Secondary | ICD-10-CM | POA: Insufficient documentation

## 2014-06-09 DIAGNOSIS — IMO0002 Reserved for concepts with insufficient information to code with codable children: Secondary | ICD-10-CM | POA: Diagnosis not present

## 2014-06-09 DIAGNOSIS — E1322 Other specified diabetes mellitus with diabetic chronic kidney disease: Secondary | ICD-10-CM

## 2014-06-09 DIAGNOSIS — K2970 Gastritis, unspecified, without bleeding: Secondary | ICD-10-CM | POA: Diagnosis not present

## 2014-06-09 DIAGNOSIS — E1329 Other specified diabetes mellitus with other diabetic kidney complication: Secondary | ICD-10-CM

## 2014-06-09 DIAGNOSIS — F339 Major depressive disorder, recurrent, unspecified: Secondary | ICD-10-CM | POA: Diagnosis not present

## 2014-06-09 DIAGNOSIS — E1149 Type 2 diabetes mellitus with other diabetic neurological complication: Secondary | ICD-10-CM | POA: Diagnosis not present

## 2014-06-09 DIAGNOSIS — Z79899 Other long term (current) drug therapy: Secondary | ICD-10-CM | POA: Diagnosis not present

## 2014-06-09 DIAGNOSIS — F3289 Other specified depressive episodes: Secondary | ICD-10-CM | POA: Diagnosis not present

## 2014-06-09 DIAGNOSIS — N183 Chronic kidney disease, stage 3 unspecified: Secondary | ICD-10-CM

## 2014-06-09 DIAGNOSIS — R1013 Epigastric pain: Secondary | ICD-10-CM

## 2014-06-09 DIAGNOSIS — F1210 Cannabis abuse, uncomplicated: Secondary | ICD-10-CM | POA: Diagnosis not present

## 2014-06-09 DIAGNOSIS — R51 Headache: Secondary | ICD-10-CM | POA: Diagnosis not present

## 2014-06-09 DIAGNOSIS — N189 Chronic kidney disease, unspecified: Secondary | ICD-10-CM | POA: Diagnosis not present

## 2014-06-09 DIAGNOSIS — R011 Cardiac murmur, unspecified: Secondary | ICD-10-CM | POA: Insufficient documentation

## 2014-06-09 DIAGNOSIS — E1365 Other specified diabetes mellitus with hyperglycemia: Secondary | ICD-10-CM

## 2014-06-09 DIAGNOSIS — X58XXXA Exposure to other specified factors, initial encounter: Secondary | ICD-10-CM | POA: Diagnosis not present

## 2014-06-09 DIAGNOSIS — R112 Nausea with vomiting, unspecified: Secondary | ICD-10-CM | POA: Diagnosis not present

## 2014-06-09 DIAGNOSIS — K219 Gastro-esophageal reflux disease without esophagitis: Secondary | ICD-10-CM | POA: Diagnosis not present

## 2014-06-09 LAB — URINALYSIS, ROUTINE W REFLEX MICROSCOPIC
BILIRUBIN URINE: NEGATIVE
Glucose, UA: 250 mg/dL — AB
HGB URINE DIPSTICK: NEGATIVE
Ketones, ur: NEGATIVE mg/dL
Leukocytes, UA: NEGATIVE
Nitrite: NEGATIVE
PROTEIN: NEGATIVE mg/dL
SPECIFIC GRAVITY, URINE: 1.014 (ref 1.005–1.030)
UROBILINOGEN UA: 0.2 mg/dL (ref 0.0–1.0)
pH: 6 (ref 5.0–8.0)

## 2014-06-09 LAB — COMPREHENSIVE METABOLIC PANEL
ALBUMIN: 4.1 g/dL (ref 3.5–5.2)
ALT: 14 U/L (ref 0–35)
AST: 17 U/L (ref 0–37)
Alkaline Phosphatase: 98 U/L (ref 39–117)
BUN: 32 mg/dL — ABNORMAL HIGH (ref 6–23)
CALCIUM: 10.1 mg/dL (ref 8.4–10.5)
CO2: 22 mEq/L (ref 19–32)
Chloride: 103 mEq/L (ref 96–112)
Creatinine, Ser: 2.05 mg/dL — ABNORMAL HIGH (ref 0.50–1.10)
GFR calc non Af Amer: 27 mL/min — ABNORMAL LOW (ref >90)
GFR, EST AFRICAN AMERICAN: 32 mL/min — AB (ref >90)
GLUCOSE: 294 mg/dL — AB (ref 70–99)
Potassium: 5.3 mEq/L (ref 3.7–5.3)
Sodium: 140 mEq/L (ref 137–147)
Total Bilirubin: 0.4 mg/dL (ref 0.3–1.2)
Total Protein: 7.9 g/dL (ref 6.0–8.3)

## 2014-06-09 LAB — CBC WITH DIFFERENTIAL/PLATELET
BASOS PCT: 0 % (ref 0–1)
Basophils Absolute: 0 10*3/uL (ref 0.0–0.1)
Eosinophils Absolute: 0.2 10*3/uL (ref 0.0–0.7)
Eosinophils Relative: 2 % (ref 0–5)
HEMATOCRIT: 33.3 % — AB (ref 36.0–46.0)
HEMOGLOBIN: 11.3 g/dL — AB (ref 12.0–15.0)
Lymphocytes Relative: 28 % (ref 12–46)
Lymphs Abs: 2.5 10*3/uL (ref 0.7–4.0)
MCH: 31.1 pg (ref 26.0–34.0)
MCHC: 33.9 g/dL (ref 30.0–36.0)
MCV: 91.7 fL (ref 78.0–100.0)
MONO ABS: 0.6 10*3/uL (ref 0.1–1.0)
Monocytes Relative: 7 % (ref 3–12)
NEUTROS ABS: 5.8 10*3/uL (ref 1.7–7.7)
NEUTROS PCT: 63 % (ref 43–77)
Platelets: 244 10*3/uL (ref 150–400)
RBC: 3.63 MIL/uL — ABNORMAL LOW (ref 3.87–5.11)
RDW: 13.5 % (ref 11.5–15.5)
WBC: 9.1 10*3/uL (ref 4.0–10.5)

## 2014-06-09 LAB — GLUCOSE, CAPILLARY
Glucose-Capillary: 171 mg/dL — ABNORMAL HIGH (ref 70–99)
Glucose-Capillary: 136 mg/dL — ABNORMAL HIGH (ref 70–99)
Glucose-Capillary: 45 mg/dL — ABNORMAL LOW (ref 70–99)

## 2014-06-09 LAB — LIPASE, BLOOD: Lipase: 88 U/L — ABNORMAL HIGH (ref 11–59)

## 2014-06-09 MED ORDER — OXYCODONE HCL 5 MG PO TABS
5.0000 mg | ORAL_TABLET | Freq: Every day | ORAL | Status: DC | PRN
  Administered 2014-06-09 – 2014-06-10 (×2): 5 mg via ORAL
  Filled 2014-06-09 (×2): qty 1

## 2014-06-09 MED ORDER — LISINOPRIL 40 MG PO TABS: 40.0000 mg | ORAL_TABLET | Freq: Every morning | ORAL | Status: DC

## 2014-06-09 MED ORDER — INSULIN GLARGINE 100 UNIT/ML ~~LOC~~ SOLN
10.0000 [IU] | Freq: Every morning | SUBCUTANEOUS | Status: DC
  Administered 2014-06-10: 10 [IU] via SUBCUTANEOUS
  Filled 2014-06-09: qty 0.1

## 2014-06-09 MED ORDER — HYDROCHLOROTHIAZIDE 25 MG PO TABS
25.0000 mg | ORAL_TABLET | Freq: Every day | ORAL | Status: DC
  Administered 2014-06-09 – 2014-06-10 (×2): 25 mg via ORAL
  Filled 2014-06-09 (×2): qty 1

## 2014-06-09 MED ORDER — PROMETHAZINE HCL 25 MG/ML IJ SOLN
25.0000 mg | Freq: Once | INTRAMUSCULAR | Status: AC
  Administered 2014-06-09: 25 mg via INTRAVENOUS
  Filled 2014-06-09: qty 1

## 2014-06-09 MED ORDER — INSULIN ASPART 100 UNIT/ML ~~LOC~~ SOLN
0.0000 [IU] | Freq: Three times a day (TID) | SUBCUTANEOUS | Status: DC
Start: 2014-06-09 — End: 2014-06-10
  Administered 2014-06-09 – 2014-06-10 (×3): 3 [IU] via SUBCUTANEOUS

## 2014-06-09 MED ORDER — CYCLOBENZAPRINE HCL 10 MG PO TABS: 5.0000 mg | ORAL_TABLET | Freq: Three times a day (TID) | ORAL | Status: DC | PRN

## 2014-06-09 MED ORDER — HEPARIN SODIUM (PORCINE) 5000 UNIT/ML IJ SOLN
5000.0000 [IU] | Freq: Three times a day (TID) | INTRAMUSCULAR | Status: DC
  Administered 2014-06-09 – 2014-06-10 (×4): 5000 [IU] via SUBCUTANEOUS
  Filled 2014-06-09 (×6): qty 1

## 2014-06-09 MED ORDER — SODIUM CHLORIDE 0.9 % IV SOLN
INTRAVENOUS | Status: DC
  Administered 2014-06-09 – 2014-06-10 (×3): via INTRAVENOUS

## 2014-06-09 MED ORDER — HYDROMORPHONE HCL PF 1 MG/ML IJ SOLN
2.0000 mg | Freq: Once | INTRAMUSCULAR | Status: AC
  Administered 2014-06-09: 2 mg via INTRAVENOUS
  Filled 2014-06-09: qty 2

## 2014-06-09 MED ORDER — LORAZEPAM 2 MG/ML IJ SOLN
1.0000 mg | Freq: Once | INTRAMUSCULAR | Status: AC
  Administered 2014-06-09: 1 mg via INTRAVENOUS
  Filled 2014-06-09: qty 1

## 2014-06-09 MED ORDER — POLYETHYLENE GLYCOL 3350 17 G PO PACK
17.0000 g | PACK | Freq: Every day | ORAL | Status: DC
  Administered 2014-06-09 – 2014-06-10 (×2): 17 g via ORAL
  Filled 2014-06-09 (×2): qty 1

## 2014-06-09 MED ORDER — FAMOTIDINE 40 MG PO TABS
40.0000 mg | ORAL_TABLET | Freq: Every day | ORAL | Status: DC
  Administered 2014-06-09 – 2014-06-10 (×2): 40 mg via ORAL
  Filled 2014-06-09 (×2): qty 1

## 2014-06-09 MED ORDER — AMLODIPINE BESYLATE 10 MG PO TABS
10.0000 mg | ORAL_TABLET | Freq: Every day | ORAL | Status: DC
  Administered 2014-06-10: 10 mg via ORAL
  Filled 2014-06-09 (×2): qty 1

## 2014-06-09 MED ORDER — PROMETHAZINE HCL 25 MG PO TABS
25.0000 mg | ORAL_TABLET | Freq: Three times a day (TID) | ORAL | Status: DC | PRN
  Administered 2014-06-10 (×2): 25 mg via ORAL
  Filled 2014-06-09 (×2): qty 1

## 2014-06-09 MED ORDER — SODIUM CHLORIDE 0.9 % IV SOLN: INTRAVENOUS | Status: DC

## 2014-06-09 MED ORDER — CARVEDILOL 25 MG PO TABS
25.0000 mg | ORAL_TABLET | Freq: Two times a day (BID) | ORAL | Status: DC
  Administered 2014-06-09 – 2014-06-10 (×2): 25 mg via ORAL
  Filled 2014-06-09 (×4): qty 1

## 2014-06-09 MED ORDER — SODIUM CHLORIDE 0.9 % IV BOLUS (SEPSIS)
2000.0000 mL | Freq: Once | INTRAVENOUS | Status: AC
  Administered 2014-06-09: 2000 mL via INTRAVENOUS

## 2014-06-09 MED ORDER — SODIUM CHLORIDE 0.9 % IV BOLUS (SEPSIS): 1000.0000 mL | Freq: Once | INTRAVENOUS | Status: DC

## 2014-06-09 MED ORDER — LORAZEPAM 1 MG PO TABS
1.0000 mg | ORAL_TABLET | Freq: Two times a day (BID) | ORAL | Status: DC | PRN
  Administered 2014-06-09 – 2014-06-10 (×2): 1 mg via ORAL
  Filled 2014-06-09 (×2): qty 1

## 2014-06-09 NOTE — ED Notes (Signed)
Pt given ice chips. Pt got very angry while waiting for me to ask nurse for ice chips. This NT went and asked Kaitlyn PA if pt could have some ice chips, [place], [place] okayed me giving her ice chips

## 2014-06-09 NOTE — ED Provider Notes (Signed)
CSN: [id number]     Arrival date & time 06/09/14  0945 History   First MD Initiated Contact with Patient 06/09/14 1000     Chief Complaint  Patient presents with  . Nausea  . Emesis  . Chest Pain     (Consider location/radiation/quality/duration/timing/severity/associated sxs/prior Treatment) Patient is a 48 y.o. female presenting with vomiting and chest pain. The history is provided by the patient.  Emesis Chest Pain Associated symptoms: vomiting    patient here with upper epigastric abdominal pain with nonbilious vomiting since this morning. History of cyclical vomiting syndrome and this is similar. No anginal type chest pain. Denies any dyspnea. No black or bloody stools. No fever appreciated. Has used medications at home without relief. Recent admission for similar symptoms. Symptoms persistent and nothing makes them better or worse. EMS was called and patient transported here.  Past Medical History  Diagnosis Date  . PANCREATITIS 05/09/2008  . Cyclic vomiting syndrome   . Hypertension   . Complication of anesthesia     "problems waking up"  . Heart murmur   . Myocardial infarction 07/2007    "they say I've had a silent one; I don't know"  . Pneumonia   . Anemia   . Blood transfusion   . GERD (gastroesophageal reflux disease)   . Anxiety   . Depression   . Smoker   . Marijuana smoker, continuous   . NEUROPATHY, DIABETIC 02/23/2007  . Type II diabetes mellitus   . Diabetic gastroparesis     /e-chart  . Chronic kidney disease (CKD), stage III (moderate)    Past Surgical History  Procedure Laterality Date  . Port-a-cath placement  ~ 2008    right chest; "poor access; frequent hospitalizations"  . Laparotomy and lysis of adhesions    . Abdominal hysterectomy  02/2002  . Cholecystectomy  1980's  . Cardiac catheterization    . Dilation and evacuation  X3  . Left hand surgery    . Cataract extraction w/ intraocular lens  implant, bilateral    . Colonoscopy    . Upper gi  endoscopy     Family History  Problem Relation Age of Onset  . Diabetes type II Mother   . Diabetes Mother   . Hypertension Mother   . Diabetes type II Sister   . Diabetes Sister   . Hypertension Sister   . Hypertension Brother    History  Substance Use Topics  . Smoking status: Current Some Day Smoker -- 0.50 packs/day for 22 years    Types: Cigarettes  . Smokeless tobacco: Never Used  . Alcohol Use: No   OB History   Grav Para Term Preterm Abortions TAB SAB Ect Mult Living                 Review of Systems  Cardiovascular: Positive for chest pain.  Gastrointestinal: Positive for vomiting.  All other systems reviewed and are negative.     Allergies  Bee venom; Acetaminophen; Novolog; and Erythromycin  Home Medications   Prior to Admission medications   Medication Sig Start Date End Date Taking? Authorizing Provider  amLODipine (NORVASC) 10 MG tablet  01/01/14   Historical Provider, MD  carvedilol (COREG) 25 MG tablet Take 1 tablet (25 mg total) by mouth 2 (two) times daily with a meal. 11/15/13   Hilton Sinclair, MD  cyclobenzaprine (FLEXERIL) 5 MG tablet Take 1 tablet (5 mg total) by mouth 3 (three) times daily as needed. For spasms 04/16/14  Hilton Sinclair, MD  diclofenac sodium (VOLTAREN) 1 % GEL Apply 2 g topically 4 (four) times daily as needed (for pain). 01/30/14   Hilton Sinclair, MD  famotidine (PEPCID) 40 MG tablet Take 1 tablet (40 mg total) by mouth daily. 03/06/14   Lind Covert, MD  hydrochlorothiazide (HYDRODIURIL) 25 MG tablet Take 1 tablet (25 mg total) by mouth daily. 05/15/14   Langston Masker, MD  insulin glargine (LANTUS) 100 UNIT/ML injection Inject 0.2 mLs (20 Units total) into the skin every morning. 05/15/14   Nobie Putnam, DO  lisinopril (PRINIVIL,ZESTRIL) 40 MG tablet Take 40 mg by mouth every morning. 04/03/14   Hilton Sinclair, MD  lisinopril (PRINIVIL,ZESTRIL) 40 MG tablet Take 1 tablet (40 mg total) by mouth  every morning. 05/15/14   Langston Masker, MD  LORazepam (ATIVAN) 1 MG tablet Take 1 tablet (1 mg total) by mouth 2 (two) times daily as needed for anxiety. 05/24/14   Hilton Sinclair, MD  oxyCODONE (OXY IR/ROXICODONE) 5 MG immediate release tablet Take 1 tablet (5 mg total) by mouth daily as needed. 05/24/14   Hilton Sinclair, MD  promethazine (PHENERGAN) 25 MG tablet Take 1 tablet (25 mg total) by mouth every 8 (eight) hours as needed for nausea. 05/15/14   Nobie Putnam, DO  SUMAtriptan (IMITREX) 50 MG tablet Take 50 mg by mouth every 2 (two) hours as needed for migraine. May repeat in 2 hours if headache persists or recurs.    Historical Provider, MD   There were no vitals taken for this visit. Physical Exam  Nursing note and vitals reviewed. Constitutional: She is oriented to person, place, and time. She appears well-developed and well-nourished.  Non-toxic appearance. No distress.  HENT:  Head: Normocephalic and atraumatic.  Eyes: Conjunctivae, EOM and lids are normal. Pupils are equal, round, and reactive to light.  Neck: Normal range of motion. Neck supple. No tracheal deviation present. No mass present.  Cardiovascular: Normal rate, regular rhythm and normal heart sounds.  Exam reveals no gallop.   No murmur heard. Pulmonary/Chest: Effort normal and breath sounds normal. No stridor. No respiratory distress. She has no decreased breath sounds. She has no wheezes. She has no rhonchi. She has no rales.  Abdominal: Soft. Normal appearance and bowel sounds are normal. She exhibits no distension. There is tenderness in the epigastric area. There is no rigidity, no rebound, no guarding and no CVA tenderness.    Musculoskeletal: Normal range of motion. She exhibits no edema and no tenderness.  Neurological: She is alert and oriented to person, place, and time. She has normal strength. No cranial nerve deficit or sensory deficit. GCS eye subscore is 4. GCS verbal subscore is 5. GCS  motor subscore is 6.  Skin: Skin is warm and dry. No abrasion and no rash noted.  Psychiatric: She has a normal mood and affect. Her speech is normal and behavior is normal.    ED Course  Procedures (including critical care time) Labs Review Labs Reviewed  CBC WITH DIFFERENTIAL  COMPREHENSIVE METABOLIC PANEL  LIPASE, BLOOD    Imaging Review No results found.   EKG Interpretation   Date/Time:  Sunday June 09 2014 09:56:08 EDT Ventricular Rate:  94 PR Interval:  156 QRS Duration: 74 QT Interval:  336 QTC Calculation: 420 R Axis:   80 Text Interpretation:  Sinus rhythm Biatrial enlargement Anterior infarct,  old Confirmed by Trino Higinbotham  MD, Britzy Graul ([id number]) on 06/09/2014 10:01:12 AM      MDM  Final diagnoses:  None     Date: 06/09/2014  Rate: 94  Rhythm: normal sinus rhythm  QRS Axis: normal  Intervals: normal  ST/T Wave abnormalities: normal  Conduction Disutrbances:none  Narrative Interpretation:   Old EKG Reviewed: none available   Patient given IV fluids along with Ativan, hydromorphone, promethazine. Symptoms and blood pressure have improved. She still remains nauseated with some abdominal cramping. Will be admitted for observation   Leota Jacobsen, MD 06/09/14 1243

## 2014-06-09 NOTE — Progress Notes (Signed)
Pt admitted to the unit at 1514. Pt mental status is A&Ox4. Pt oriented to room, staff, and call bell. Skin is intact. Full assessment charted in [facility]. Call bell within reach. Visitor guidelines reviewed w/ pt and/or family.

## 2014-06-09 NOTE — Progress Notes (Signed)
Hypoglycemic Event  CBG: 45  Treatment: 15 GM carbohydrate snack  Symptoms: Sweaty  Follow-up CBG: Time:2121 CBG Result:136  Possible Reasons for Event: Unknown  Comments/MD notified:[place], MD    Arneta Cliche  Remember to initiate Hypoglycemia Order Set & complete

## 2014-06-09 NOTE — Progress Notes (Signed)
Called ED for report at 1327. RN was transporting another patient, and needed to call back.

## 2014-06-09 NOTE — ED Notes (Signed)
Received pt from home via EMS with c/o nausea and vomiting. On scene pt c/o chest pain and abdominal pain. Pt has history of same.

## 2014-06-09 NOTE — Progress Notes (Signed)
Report received from ED at 1459. Awaiting pt arrival to [facility].

## 2014-06-09 NOTE — Progress Notes (Signed)
Lattie Haw, RN attempted to call report again, ED RN was intubating another patient. She will call us back.

## 2014-06-09 NOTE — Progress Notes (Signed)
Seen and examined.  Brief note while I await cosign of resident H&PE.  [name] [name] is well known to me with DM, depression and cyclic vomiting.  She is vomiting again.  Agree with admit and hydrate.  Tends to respond to benzo and phenergan and NPO.  Nothing atypical about her presentation to warrant a new, extensive work up.

## 2014-06-09 NOTE — H&P (Signed)
[facility] Admission History and Physical Service Pager: [phone]  Patient name: [name] [name] Medical record number: [id number] Date of birth: 09/17/1966 Age: 48 y.o. Gender: female  Primary Care Provider: Conni Slipper, MD Consultants: None Code Status: Full code  Chief Complaint: Nausea and vomiting  Assessment and Plan: [name] is a 48 y.o. female presenting with nausea and vomiting . PMH is significant for cyclic vomiting syndrome, diabetes mellitus type 2, anxiety, acute pancreatitis, HTN, CKD.  Nausea and vomiting: similar to previous episodes Patient has lipase of 88. Could be mild acute pancreatitis vs cyclic vomiting (she refuses to believe marijuana contributes to symptoms and smoked earlier this week) vs side effect from hyperglycemia. Patient currently stable.  Place in observation, med-surg, attending Dr. Janeann Forehand q6hrs PRN  Zofran q8hrs PRN  Phenergan 25mg  q8hrs PRN  Clear liquid diet, advance diet as tolerated  IVF to keep well hydrated  I am patient's PCP. Upon discharge, OK to provide patient with 1 month of ativan and close follow up in clinic.  ?Are we missing any diagnosis? Consider pancreatic imaging/abdominal US for chronic pancreatitis vs malignancy as alternate dx.  Diabetes mellitus, type 2: recent history of DKA. Blood sugar elevated to 294. Last A1C of 9.4 on 04/04/2014. Current CO2 of 22 with anion gap of 15.  Continue home Lantus as decreased dose of 10 units  Moderate sliding scale  Bmet q6 hours to monitor gap closure  Urinalysis  Upon discharge, patient should be set up with Pharmacy clinic for better DM management.  Hypertension: currently controlled  Continue home HCTZ 25mg  qd, amlodipine 10mg , and carvedilol 25mg  BID  Holding lisinopril in setting of mild AKI and nausea/emesis. Restart on d/c.  Anemia: baseline of around 10-12. 11.3 on admission.  Monitor  CBC  Constipation  Miralax  # Abrasion, left earlobe: does not appear infected, non-tender  Monitor for fever, purulence, or increasing erythema or pain  CKD: Baseline creatinine of around 2. Creatinine on admission of 2.05  Follow-up Bmet in AM  GERD  Continue pepcid  FEN/GI: Pepcid, Clear liquid diet, NS IVF @125ml /hr Prophylaxis: heparin subq  Disposition: Place in observation pending improvement in intractable vomiting.  History of Present Illness: [name] [name] is a 48 y.o. female presenting with nausea and vomiting since 5:30 this morning. Symptoms are very similar to prior cyclic vomiting episodes, except today she has a headache. She also had seafood that she has not eaten in a while 3 days ago. Woke up feeling sick yesterday. Called emergency line asking for ativan which she could not get over the phone. Symptoms subsided last night but returned this morning. Used PRN phenergan PR which did not help. Didn't have anything else at home. Meds in the ED (phenergan, ativan, dilaudid, 3L IV fluids) did not help. Emesis today was nonbloody, mostly spit. Has abdominal pain, mild chest pain when she vomits, but denies dyspnea. Stools are constipated. Had 15 ativan from end of May but ran out yesterday (she and PCP had discussed use just with flares but she has been using daily, with 3 flares since last visit with PCP 5/29). No new meds.   Review Of Systems: Per HPI with the following additions: None Otherwise 12 point review of systems was performed and was unremarkable.  Patient Active Problem List   Diagnosis Date Noted  . Cyclical vomiting [id number]  . Vaginal discharge 05/24/2014  . Rotator cuff syndrome 04/19/2014  . Biceps tendinopathy of right upper  extremity 04/05/2014  . Protein-calorie malnutrition, severe 12/17/2013  . Acute epigastric pain 12/15/2013  . Anemia 11/15/2013  . Pancreatitis, acute 10/14/2013  . CKD (chronic kidney disease) 08/17/2013  . Abdominal  pain, epigastric 04/19/2013  . Esophagitis 04/18/2013  . Preventative health care 09/18/2012  . High risk social situation 08/31/2012  . Allergic rhinitis, seasonal 05/04/2012  . Marijuana smoker 11/03/2011  . Uncontrolled secondary diabetes mellitus with stage 3 CKD (GFR 30-59) 10/23/2011  . Cyclic vomiting syndrome 08/03/2011  . Hot flashes 07/29/2011  . BACK PAIN, CHRONIC 04/10/2007  . DEPRESSION, MAJOR, RECURRENT 02/23/2007  . ANXIETY 02/23/2007  . TOBACCO DEPENDENCE 02/23/2007  . MIGRAINE, UNSPEC., W/O INTRACTABLE MIGRAINE 02/23/2007  . Essential hypertension, malignant 02/23/2007  . INSOMNIA NOS 02/23/2007  . NEUROPATHY, DIABETIC 02/23/2007  . GERD 01/27/2004   Past Medical History: Past Medical History  Diagnosis Date  . PANCREATITIS 05/09/2008  . Cyclic vomiting syndrome   . Hypertension   . Complication of anesthesia     "problems waking up"  . Heart murmur   . Myocardial infarction 07/2007    "they say I've had a silent one; I don't know"  . Pneumonia   . Anemia   . Blood transfusion   . GERD (gastroesophageal reflux disease)   . Anxiety   . Depression   . Smoker   . Marijuana smoker, continuous   . NEUROPATHY, DIABETIC 02/23/2007  . Type II diabetes mellitus   . Diabetic gastroparesis     /e-chart  . Chronic kidney disease (CKD), stage III (moderate)    Past Surgical History: Past Surgical History  Procedure Laterality Date  . Port-a-cath placement  ~ 2008    right chest; "poor access; frequent hospitalizations"  . Laparotomy and lysis of adhesions    . Abdominal hysterectomy  02/2002  . Cholecystectomy  1980's  . Cardiac catheterization    . Dilation and evacuation  X3  . Left hand surgery    . Cataract extraction w/ intraocular lens  implant, bilateral    . Colonoscopy    . Upper gi endoscopy     Social History: History  Substance Use Topics  . Smoking status: Current Some Day Smoker -- 0.50 packs/day for 22 years    Types: Cigarettes  .  Smokeless tobacco: Never Used  . Alcohol Use: No  Marijuana earlier this week. Seafood for the first time earlier this week Smoking cigs - down to 3/day. Coffee 2 cups/day. Alcohol - none  Additional social history: None  Please also refer to relevant sections of EMR.  Family History: Family History  Problem Relation Age of Onset  . Diabetes type II Mother   . Diabetes Mother   . Hypertension Mother   . Diabetes type II Sister   . Diabetes Sister   . Hypertension Sister   . Hypertension Brother    Allergies and Medications: Allergies  Allergen Reactions  . Bee Venom Anaphylaxis  . Acetaminophen Nausea And Vomiting  . Novolog [Insulin Aspart] Other (See Comments)    Muscles in feet cramp  . Erythromycin Nausea And Vomiting   Current Facility-Administered Medications on File Prior to Encounter  Medication Dose Route Frequency Provider Last Rate Last Dose  . 0.9 %  sodium chloride infusion   Intravenous Continuous Andrena Mews, MD 20 mL/hr at 04/19/13 1500 500 mL at 04/19/13 1500   Current Outpatient Prescriptions on File Prior to Encounter  Medication Sig Dispense Refill  . amLODipine (NORVASC) 10 MG tablet       .  carvedilol (COREG) 25 MG tablet Take 1 tablet (25 mg total) by mouth 2 (two) times daily with a meal.  60 tablet  5  . cyclobenzaprine (FLEXERIL) 5 MG tablet Take 1 tablet (5 mg total) by mouth 3 (three) times daily as needed. For spasms  30 tablet  0  . diclofenac sodium (VOLTAREN) 1 % GEL Apply 2 g topically 4 (four) times daily as needed (for pain).  1 Tube  5  . famotidine (PEPCID) 40 MG tablet Take 1 tablet (40 mg total) by mouth daily.  30 tablet  3  . hydrochlorothiazide (HYDRODIURIL) 25 MG tablet Take 1 tablet (25 mg total) by mouth daily.  30 tablet  3  . insulin glargine (LANTUS) 100 UNIT/ML injection Inject 0.2 mLs (20 Units total) into the skin every morning.  10 mL  0  . lisinopril (PRINIVIL,ZESTRIL) 40 MG tablet Take 40 mg by mouth every morning.       Marland Kitchen lisinopril (PRINIVIL,ZESTRIL) 40 MG tablet Take 1 tablet (40 mg total) by mouth every morning.  90 tablet  1  . LORazepam (ATIVAN) 1 MG tablet Take 1 tablet (1 mg total) by mouth 2 (two) times daily as needed for anxiety.  15 tablet  0  . oxyCODONE (OXY IR/ROXICODONE) 5 MG immediate release tablet Take 1 tablet (5 mg total) by mouth daily as needed.  15 tablet  0  . promethazine (PHENERGAN) 25 MG tablet Take 1 tablet (25 mg total) by mouth every 8 (eight) hours as needed for nausea.  30 tablet  0  . SUMAtriptan (IMITREX) 50 MG tablet Take 50 mg by mouth every 2 (two) hours as needed for migraine. May repeat in 2 hours if headache persists or recurs.        Objective: BP 146/77  Pulse 83  Resp 13  SpO2 99%  Exam: General: Laying in bed in no acute distress HEENT: PERRL, EOMI, dry mucous membranes, left ear lobe scrape 63mm diameter, non-tender, non-erythematous, no purulence or fluctuance Cardiovascular: Regular rate and rhythm, no murmur, 2+ distal pulses Respiratory: Clear to auscultation bilaterally, no wheezing Abdomen: Soft, RLQ mod-severe tenderness that was minimal when patient was distracted and palpated over entire abdomen. Non-distended Extremities: Moves spontaneously, no calf tenderness or erythema or asymmetry Skin: No cyanosis Neuro: Alert and oriented x3  Labs and Imaging: CBC BMET   Recent Labs Lab 06/09/14 1050  WBC 9.1  HGB 11.3*  HCT 33.3*  PLT 244    Recent Labs Lab 06/09/14 1050  NA 140  K 5.3  CL 103  CO2 22  BUN 32*  CREATININE 2.05*  GLUCOSE 294*  CALCIUM 10.1     CBG (last 3)  No results found for this basename: GLUCAP,  in the last 72 hours   Cordelia Poche, MD 06/09/2014, 12:43 PM PGY-1, [place] Intern pager: [phone], text pages welcome  I have seen and examined patient and agree with assessment and plan outlined in Dr Lisbeth Ply note. My additions are in blue.  Hilton Sinclair, MD PGY-2, [place]

## 2014-06-10 DIAGNOSIS — D649 Anemia, unspecified: Secondary | ICD-10-CM | POA: Diagnosis not present

## 2014-06-10 DIAGNOSIS — R1115 Cyclical vomiting syndrome unrelated to migraine: Secondary | ICD-10-CM | POA: Diagnosis not present

## 2014-06-10 DIAGNOSIS — F411 Generalized anxiety disorder: Secondary | ICD-10-CM | POA: Diagnosis not present

## 2014-06-10 DIAGNOSIS — R1013 Epigastric pain: Secondary | ICD-10-CM | POA: Diagnosis not present

## 2014-06-10 LAB — COMPREHENSIVE METABOLIC PANEL
ALT: 9 U/L (ref 0–35)
AST: 13 U/L (ref 0–37)
Albumin: 3.2 g/dL — ABNORMAL LOW (ref 3.5–5.2)
Alkaline Phosphatase: 79 U/L (ref 39–117)
BUN: 20 mg/dL (ref 6–23)
CO2: 21 mEq/L (ref 19–32)
Calcium: 8.9 mg/dL (ref 8.4–10.5)
Chloride: 103 mEq/L (ref 96–112)
Creatinine, Ser: 1.6 mg/dL — ABNORMAL HIGH (ref 0.50–1.10)
GFR calc non Af Amer: 37 mL/min — ABNORMAL LOW (ref >90)
GFR, EST AFRICAN AMERICAN: 43 mL/min — AB (ref >90)
Glucose, Bld: 143 mg/dL — ABNORMAL HIGH (ref 70–99)
Potassium: 4.7 mEq/L (ref 3.7–5.3)
Sodium: 138 mEq/L (ref 137–147)
Total Bilirubin: 0.4 mg/dL (ref 0.3–1.2)
Total Protein: 6.2 g/dL (ref 6.0–8.3)

## 2014-06-10 LAB — CBC
HCT: 28.2 % — ABNORMAL LOW (ref 36.0–46.0)
Hemoglobin: 9.5 g/dL — ABNORMAL LOW (ref 12.0–15.0)
MCH: 31.3 pg (ref 26.0–34.0)
MCHC: 33.7 g/dL (ref 30.0–36.0)
MCV: 92.8 fL (ref 78.0–100.0)
PLATELETS: 181 10*3/uL (ref 150–400)
RBC: 3.04 MIL/uL — AB (ref 3.87–5.11)
RDW: 13.6 % (ref 11.5–15.5)
WBC: 5.8 10*3/uL (ref 4.0–10.5)

## 2014-06-10 LAB — GLUCOSE, CAPILLARY
Glucose-Capillary: 171 mg/dL — ABNORMAL HIGH (ref 70–99)
Glucose-Capillary: 184 mg/dL — ABNORMAL HIGH (ref 70–99)

## 2014-06-10 MED ORDER — HEPARIN SOD (PORK) LOCK FLUSH 100 UNIT/ML IV SOLN
500.0000 [IU] | INTRAVENOUS | Status: DC | PRN
  Administered 2014-06-10 (×2): 500 [IU]
  Filled 2014-06-10: qty 5

## 2014-06-10 MED ORDER — SODIUM CHLORIDE 0.9 % IJ SOLN: 10.0000 mL | Freq: Two times a day (BID) | INTRAMUSCULAR | Status: DC

## 2014-06-10 MED ORDER — BOOST / RESOURCE BREEZE PO LIQD
1.0000 | ORAL | Status: DC
  Administered 2014-06-10: 1 via ORAL

## 2014-06-10 MED ORDER — SODIUM CHLORIDE 0.9 % IJ SOLN
10.0000 mL | INTRAMUSCULAR | Status: DC | PRN
Start: 2014-06-10 — End: 2014-06-10
  Administered 2014-06-10 (×3): 10 mL

## 2014-06-10 MED ORDER — HEPARIN SOD (PORK) LOCK FLUSH 100 UNIT/ML IV SOLN
500.0000 [IU] | INTRAVENOUS | Status: DC
  Filled 2014-06-10: qty 5

## 2014-06-10 MED ORDER — LORAZEPAM 1 MG PO TABS: 1.0000 mg | ORAL_TABLET | Freq: Two times a day (BID) | ORAL | Status: DC | PRN

## 2014-06-10 NOTE — Progress Notes (Signed)
Family Medicine Teaching Service Daily Progress Note Intern Pager: [phone]  Patient name: [name] [name] Medical record number: [id number] Date of birth: 01-04-1966 Age: 48 y.o. Gender: female  Primary Care Provider: Conni Slipper, MD Consultants: none Code Status: Full (confirmed on admission)  Pt Overview and Major Events to Date:  6/14 - Admitted for n/v, consistent with cyclical vomiting flare 6/15 - Improved, advanced diet, DC to home  Assessment and Plan: [name] [name] is a 48 y.o. female presenting with nausea and vomiting . PMH is significant for cyclic vomiting [name], [name] [name], [name] [name], [name] [name], [name] [name], CKD.   # Nausea and vomiting - Improved - similar to previous episodes Patient has lipase of 88. Could be mild [name] [name] vs cyclic vomiting (she refuses to believe marijuana contributes to symptoms and smoked earlier this week) vs side effect from hyperglycemia. Patient currently stable. - Currently improved, +mild nausea, no vomiting in 24 hours - Tolerating advanced diet clear to carb modified - Ativan, Zofran, Phenergan, PRN - Plan to discharge home with Ativan rx for 1 month, confirmed with PCP, arrange close follow-up  # [name] mellitus, type [name]:  - recent history of DKA. Blood sugar elevated to 294. Last A1C of 9.4 on 04/04/2014. Current CO2 of 22 with anion gap of 15.  Continue home Lantus as decreased dose of 10 units  Moderate sliding scale  Bmet q6 hours to monitor gap closure  Urinalysis  Upon discharge, patient should be set up with Pharmacy clinic for better DM management.  # Hypertension: currently controlled  Continue home HCTZ 25mg  qd, amlodipine 10mg , and carvedilol 25mg  BID  Holding lisinopril in setting of mild AKI and nausea/emesis. Restart on d/c.  # Anemia: baseline of around 10-12. 11.3 on admission.  Monitor CBC  # Constipation  Miralax  # Abrasion, left earlobe: does not appear infected, non-tender   Monitor for fever, purulence, or increasing erythema or pain  CKD: Baseline creatinine of around [name]. Creatinine on admission of [name].05  Cr improved [name].05 >> 1.60  GERD  Continue pepcid  FEN/GI: Pepcid, Clear liquid diet >> Carb Mod Diet, NS IVF @125ml /hr >> SLIV Prophylaxis: heparin subq  Disposition: Place in observation pending improvement in intractable vomiting, currently improved, anticipate to discharge to home today with rx for symptom control.  Subjective:  Feeling much better, improved on Ativan (ran out at home x [name] days). Mild nausea. No vomiting in 24 hours. Tolerating clear liquid diet, ready to advance to regular. Ready to go home.  Objective: Temp:  [98.1 F (36.7 C)-98.4 F (36.9 C)] 98.4 F (36.9 C) (06/15 0438) Pulse Rate:  [64-85] 69 (06/15 0438) Resp:  [12-18] 18 (06/15 0438) BP: (133-160)/(59-90) 133/74 mmHg (06/15 0438) SpO2:  [99 %-100 %] 99 % (06/15 0438) Weight:  [164 lb 9.6 oz (74.662 kg)] 164 lb 9.6 oz (74.662 kg) (06/14 1522) Physical Exam: General: sitting up eating breakfast, conversational, NAD HEENT: MMM Cardiovascular: RRR, no murmurs Respiratory: CTAB, nml effort Abdomen: soft, mild +TTP epigastric, non-distended, +active BS Extremities: no edema, non-tender Neuro: awake, alert, oriented  Laboratory:  Recent Labs Lab 06/09/14 1050 06/10/14 0600  WBC 9.1 5.8  HGB 11.3* 9.5*  HCT 33.3* 28.[name]*  PLT 244 181    Recent Labs Lab 06/09/14 1050 06/10/14 0600  NA 140 138  K 5.3 4.7  CL 103 103  CO2 22 21  BUN 32* 20  CREATININE [name].05* 1.60*  CALCIUM 10.1 8.9  PROT 7.9 6.[name]  BILITOT 0.4 0.4  ALKPHOS 98 79  ALT 14 9  AST 17 13  GLUCOSE 294* 143*   Lipase - 88  UA - neg nit, neg leuk, neg hgb, spec grav 1.014  Imaging/Diagnostic Tests: none  Nobie Putnam, DO 06/10/2014, 12:27 PM PGY-1, [place] Intern pager: [phone], text pages welcome

## 2014-06-10 NOTE — Progress Notes (Signed)
INITIAL NUTRITION ASSESSMENT  DOCUMENTATION CODES Per approved criteria  -Obesity Unspecified   INTERVENTION: Add Resource Breeze po daily, each supplement provides 250 kcal and 9 grams of protein. Pt would like [employer]. RD to continue to follow nutrition care plan.  NUTRITION DIAGNOSIS: Inadequate oral intake related to altered GI function as evidenced by pt report.   Goal: Intake to meet >90% of estimated nutrition needs.  Monitor:  weight trends, lab trends, I/O's, PO intake, supplement tolerance  Reason for Assessment: Malnutrition Screening Tool  48 y.o. female  Admitting Dx: cyclic vomiting syndrome  ASSESSMENT: PMHx significant for cyclic vomiting syndrome, DM2, HTN, CKD. Admitted with n/v. Work-up reveals cyclic vomiting episodes.  Constipated on admission. Had a BM yesterday.  Pt reports that her n/v are improved. Was able to eat breakfast this morning, has yet to try lunch. Pt with stable weight presently, which she confirms. Oral intake has been limited at home 2/2 n/v. Pt appears well-nourished with no signs of fat/muscle mass loss.  CBG's: 136 - 184  Height: Ht Readings from Last 1 Encounters:  06/09/14 5\' 1"  (1.549 m)    Weight: Wt Readings from Last 1 Encounters:  06/09/14 164 lb 9.6 oz (74.662 kg)    Ideal Body Weight: 105 lb  % Ideal Body Weight: 156%  Wt Readings from Last 10 Encounters:  06/09/14 164 lb 9.6 oz (74.662 kg)  05/24/14 165 lb (74.844 kg)  05/13/14 155 lb 3.3 oz (70.4 kg)  04/19/14 157 lb (71.215 kg)  04/04/14 161 lb (73.029 kg)  03/06/14 165 lb (74.844 kg)  01/30/14 159 lb (72.122 kg)  01/22/14 160 lb (72.576 kg)  01/02/14 152 lb 9.6 oz (69.219 kg)  12/15/13 143 lb 8 oz (65.091 kg)    Usual Body Weight: 160 lb  % Usual Body Weight: 102%  BMI:  Body mass index is 31.12 kg/(m^2). Obese Class I  Estimated Nutritional Needs: Kcal: 1500 - 1700 Protein: 60 - 70 g Fluid: at least 1.5 liters daily  Skin:  intact  Diet Order:   Heart Healthy with Carbohydrate Modified Medium restrictions  EDUCATION NEEDS: -No education needs identified at this time   Intake/Output Summary (Last 24 hours) at 06/10/14 1215 Last data filed at 06/10/14 0941  Gross per 24 hour  Intake    840 ml  Output   3251 ml  Net  -2411 ml    Last BM: 6/14  Labs:   Recent Labs Lab 06/09/14 1050 06/10/14 0600  NA 140 138  K 5.3 4.7  CL 103 103  CO2 22 21  BUN 32* 20  CREATININE 2.05* 1.60*  CALCIUM 10.1 8.9  GLUCOSE 294* 143*    CBG (last 3)   Recent Labs  06/09/14 2121 06/10/14 0849 06/10/14 1158  GLUCAP 136* 171* 184*    Scheduled Meds: . amLODipine  10 mg Oral Daily  . carvedilol  25 mg Oral BID WC  . famotidine  40 mg Oral Daily  . heparin  5,000 Units Subcutaneous 3 times per day  . hydrochlorothiazide  25 mg Oral Daily  . insulin aspart  0-15 Units Subcutaneous TID WC  . insulin glargine  10 Units Subcutaneous q morning - 10a  . polyethylene glycol  17 g Oral Daily  . sodium chloride  10-40 mL Intracatheter Q12H    Continuous Infusions: . sodium chloride 125 mL/hr at 06/10/14 0631    Past Medical History  Diagnosis Date  . PANCREATITIS 05/09/2008  . Cyclic vomiting syndrome   .  Hypertension   . Complication of anesthesia     "problems waking up"  . Heart murmur   . Myocardial infarction 07/2007    "they say I've had a silent one; I don't know"  . Pneumonia   . Anemia   . Blood transfusion   . GERD (gastroesophageal reflux disease)   . Anxiety   . Depression   . Smoker   . Marijuana smoker, continuous   . NEUROPATHY, DIABETIC 02/23/2007  . Type II diabetes mellitus   . Diabetic gastroparesis     /e-chart  . Chronic kidney disease (CKD), stage III (moderate)     Past Surgical History  Procedure Laterality Date  . Port-a-cath placement  ~ 2008    right chest; "poor access; frequent hospitalizations"  . Laparotomy and lysis of adhesions    . Abdominal hysterectomy   02/2002  . Cholecystectomy  1980's  . Cardiac catheterization    . Dilation and evacuation  X3  . Left hand surgery    . Cataract extraction w/ intraocular lens  implant, bilateral    . Colonoscopy    . Upper gi endoscopy      Inda Coke MS, RD, LDN Inpatient Registered Dietitian Pager: [phone] After-hours pager: [phone]

## 2014-06-10 NOTE — Progress Notes (Signed)
FMTS Attending Daily Note:  [name] MD  [phone] pager  Family Practice pager:  [phone] I have discussed this patient with the resident Dr. Parks Ranger.  I agree with their findings, assessment, and care plan.  Patient discharged before I was able to examine her.

## 2014-06-10 NOTE — Progress Notes (Signed)
UR Completed.  Ulyana Pitones Jane [phone] 06/10/2014  

## 2014-06-10 NOTE — H&P (Signed)
Seen and examined on 6/14.  Agree with Dr. Mcneil Sober documentation and management.  Please also see my separate note.

## 2014-06-10 NOTE — Discharge Instructions (Signed)
Your vomiting is related to your chronic use of marijuana.  Please refrain from using.  Cyclic Vomiting Syndrome Cyclic vomiting syndrome is a benign condition in which patients experience bouts or cycles of severe nausea and vomiting that last for hours or even days. The bouts of nausea and vomiting alternate with longer periods of no symptoms and generally good health. Cyclic vomiting syndrome occurs mostly in children, but can affect adults. CAUSES  [facility] has no known cause. Each episode is typically similar to the previous ones. The episodes tend to:   Start at about the same time of day.  Last the same length of time.  Present the same symptoms at the same level of intensity. Cyclic vomiting syndrome can begin at any age in children and adults. Cyclic vomiting syndrome usually starts between the ages of 3 and 7 years. In adults, episodes tend to occur less often than they do in children, but they last longer. Furthermore, the events or situations that trigger episodes in adults cannot always be pinpointed as easily as they can in children. There are 4 phases of cyclic vomiting syndrome: 1. Prodrome. The prodrome phase signals that an episode of nausea and vomiting is about to begin. This phase can last from just a few minutes to several hours. This phase is often marked by belly (abdominal) pain. Sometimes taking medicine early in the prodrome phase can stop an episode in progress. However, sometimes there is no warning. A person may simply wake up in the middle of the night or early morning and begin vomiting. 2. Episode. The episode phase consists of:  Severe vomiting.  Nausea.  Gagging (retching). 3. Recovery. The recovery phase begins when the nausea and vomiting stop. Healthy color, appetite, and energy return. 4. Symptom-free interval. The symptom-free interval phase is the period between episodes when no symptoms are present. TRIGGERS Episodes can be triggered by an infection or  event. Examples of triggers include:  Infections.  Colds, allergies, sinus problems, and the flu.  Eating certain foods such as chocolate or cheese.  Foods with monosodium glutamate (MSG) or preservatives.  Fast foods.  Pre-packaged foods.  Foods with low nutritional value (junk foods).  Overeating.  Eating just before going to bed.  Hot weather.  Dehydration.  Not enough sleep or poor sleep quality.  Physical exhaustion.  Menstruation.  Motion sickness.  Emotional stress (school or home difficulties).  Excitement or stress. SYMPTOMS  The main symptoms of cyclic vomiting syndrome are:  Severe vomiting.  Nausea.  Gagging (retching). Episodes usually begin at night or the first thing in the morning. Episodes may include vomiting or retching up to 5 or 6 times an hour during the worst of the episode. Episodes usually last anywhere from 1 to 4 days. Episodes can last for up to 10 days. Other symptoms include:  Paleness.  Exhaustion.  Listlessness.  Abdominal pain.  Loose stools or diarrhea. Sometimes the nausea and vomiting are so severe that a person appears to be almost unconscious. Sensitivity to light, headache, fever, dizziness, may also accompany an episode. In addition, the vomiting may cause drooling and excessive thirst. Drinking water usually leads to more vomiting, though the water can dilute the acid in the vomit, making the episode a little less painful. Continuous vomiting can lead to dehydration, which means that the body has lost excessive water and salts. DIAGNOSIS  Cyclic vomiting syndrome is hard to diagnose because there are no clear tests to identify it. A caregiver must diagnose cyclic  vomiting syndrome by looking at symptoms and medical history. A caregiver must exclude more common diseases or disorders that can also cause nausea and vomiting. Also, diagnosis takes time because caregivers need to identify a pattern or cycle to the  vomiting. TREATMENT  Cyclic vomiting syndrome cannot be cured. Treatment varies, but people with cyclic vomiting syndrome should get plenty of rest and sleep and take medications that prevent, stop, or lessen the vomiting episodes and other symptoms. People whose episodes are frequent and long-lasting may be treated during the symptom-free intervals in an effort to prevent or ease future episodes. The symptom-free phase is a good time to eliminate anything known to trigger an episode. For example, if episodes are brought on by stress or excitement, this period is the time to find ways to reduce stress and stay calm. If sinus problems or allergies cause episodes, those conditions should be treated. The triggers listed above should be avoided or prevented. Because of the similarities between migraine and cyclic vomiting syndrome, caregivers treat some people with severe cyclic vomiting syndrome with drugs that are also used for migraine headaches. The drugs are designed to:  Prevent episodes.  Reduce their frequency.  Lessen their severity. HOME CARE INSTRUCTIONS Once a vomiting episode begins, treatment is supportive. It helps to stay in bed and sleep in a dark, quiet room. Severe nausea and vomiting may require hospitalization and intravenous (IV) fluids to prevent dehydration. Relaxing medications (sedatives) may help if the nausea continues. Sometimes, during the prodrome phase, it is possible to stop an episode from happening altogether. Only take over-the-counter or prescription medicines for pain, discomfort or fever as directed by your caregiver. Do not give aspirin to children. During the recovery phase, drinking water and replacing lost electrolytes (salts in the blood) are very important. Electrolytes are salts that the body needs to function well and stay healthy. Symptoms during the recovery phase can vary. Some people find that their appetites return to normal immediately, while others need  to begin by drinking clear liquids and then move slowly to solid food. RELATED COMPLICATIONS The severe vomiting that defines cyclic vomiting syndrome is a risk factor for several complications:  Dehydration Vomiting causes the body to lose water quickly.  Electrolyte imbalance Vomiting also causes the body to lose the important salts it needs to keep working properly.  Peptic esophagitis The tube that connects the mouth to the stomach (esophagus) becomes injured from the stomach acid that comes up with the vomit.  Hematemesis The esophagus becomes irritated and bleeds, so blood mixes with the vomit.  Mallory-Weiss tear The lower end of the esophagus may tear open or the stomach may bruise from vomiting or retching.  Tooth decay The acid in the vomit can hurt the teeth by corroding the tooth enamel. SEEK MEDICAL CARE IF: You have questions or problems. Document Released: 02/21/2002 Document Revised: 03/06/2012 Document Reviewed: 03/22/2011 [facility] Patient Information 2014 [place], [place].

## 2014-06-10 NOTE — Discharge Summary (Signed)
[facility] Discharge Summary  Patient name: [name] [name] Medical record number: [id number] Date of birth: 10/06/1966 Age: 48 y.o. Gender: female Date of Admission: 06/09/2014  Date of Discharge: 06/10/14 Admitting Physician: Zigmund Gottron, MD  Primary Care Provider: Conni Slipper, MD Consultants: none  Indication for Hospitalization: nausea and vomiting  Discharge Diagnoses/Problem List:  Nausea and vomiting, consistent with cyclical vomiting syndrome - Resolved Type 2 DM HTN CKD-Stage III Anemia GERD  Disposition: Home  Discharge Condition: Stable  Discharge Exam: General: sitting up at bedside, conversational, NAD  HEENT: MMM  Cardiovascular: RRR, no murmurs  Respiratory: CTAB, nml effort  Abdomen: soft, (improved) mild +TTP epigastric, non-distended, +active BS  Extremities: no edema, non-tender  Neuro: awake, alert, oriented  Brief Hospital Course:  [name] [name] is a 48 y.o. female who presented with nausea and vomiting, consistent with typical cyclical vomiting syndrome. PMH is significant for cyclic vomiting syndrome, abetes mellitus type 2, HTN, CKD, h/o pancreatitis. In ED, patient with 24 hours of worsening n/v, decreased PO, abdominal pain, and emesis (NBNB) x 1 in ED, attributed to recently running out of Ativan at home, also admits to smoking marijuana recently (denies that this is trigger). Initial work-up with mostly unremarkable CMET / CBC, mildly elevated lipase 88, CBG 294, EKG (stable). Received IVF rehydration (3 L bolus), Phenergan, Ativan, Dilaudid in ED, with some improvement but persistent symptoms. Admitted for IV rehydration and medical management of cyclical vomiting.  During hospitalization, demonstrated rapid improvement in symptoms overnight, advanced diet from clears to regular without problems, resolved vomiting and abdominal pain, mildly persistent nausea. Prior to discharge patient resumed on most  home medications, tolerating PO and ambulation, vitals stable, arranged close follow-up with PCP (who specifically gave authorization to provide rx for 1 month Ativan for patient, goal to avoid re-admission).  Issues for Follow Up:  1. Cyclical Vomiting - Med management, refilled Ativan 1mg  BID PRN (#60, 0 refills). Continue to review triggers, discussed marijuana as contributing factor in hospital (patient disagrees).  Significant Procedures: none  Significant Labs and Imaging:   Recent Labs Lab 06/09/14 1050 06/10/14 0600  WBC 9.1 5.8  HGB 11.3* 9.5*  HCT 33.3* 28.2*  PLT 244 181    Recent Labs Lab 06/09/14 1050 06/10/14 0600  NA 140 138  K 5.3 4.7  CL 103 103  CO2 22 21  GLUCOSE 294* 143*  BUN 32* 20  CREATININE 2.05* 1.60*  CALCIUM 10.1 8.9  ALKPHOS 98 79  AST 17 13  ALT 14 9  ALBUMIN 4.1 3.2*   Lipase - 88  UA - neg nit, neg leuk, neg hgb, spec grav 1.014  Results/Tests Pending at Time of Discharge: none  Discharge Medications:    Medication List         carvedilol 25 MG tablet  Commonly known as:  COREG  Take 1 tablet (25 mg total) by mouth 2 (two) times daily with a meal.     cyclobenzaprine 5 MG tablet  Commonly known as:  FLEXERIL  Take 1 tablet (5 mg total) by mouth 3 (three) times daily as needed. For spasms     diclofenac sodium 1 % Gel  Commonly known as:  VOLTAREN  Apply 2 g topically 4 (four) times daily as needed (for pain).     hydrochlorothiazide 25 MG tablet  Commonly known as:  HYDRODIURIL  Take 1 tablet (25 mg total) by mouth daily.     insulin glargine 100 UNIT/ML injection  Commonly known as:  LANTUS  Inject 0.2 mLs (20 Units total) into the skin every morning.     lisinopril 40 MG tablet  Commonly known as:  PRINIVIL,ZESTRIL  Take 40 mg by mouth every morning.     LORazepam 1 MG tablet  Commonly known as:  ATIVAN  Take 1 tablet (1 mg total) by mouth 2 (two) times daily as needed for anxiety.     LORazepam 1 MG tablet   Commonly known as:  ATIVAN  Take 1 tablet (1 mg total) by mouth 2 (two) times daily as needed (nausea).     oxyCODONE 5 MG immediate release tablet  Commonly known as:  Oxy IR/ROXICODONE  Take 1 tablet (5 mg total) by mouth daily as needed.     promethazine 25 MG suppository  Commonly known as:  PHENERGAN  Place 25 mg rectally every 8 (eight) hours as needed for nausea or vomiting.     promethazine 25 MG tablet  Commonly known as:  PHENERGAN  Take 1 tablet (25 mg total) by mouth every 8 (eight) hours as needed for nausea.     SUMAtriptan 50 MG tablet  Commonly known as:  IMITREX  Take 50 mg by mouth every 2 (two) hours as needed for migraine. May repeat in 2 hours if headache persists or recurs.        Discharge Instructions: Please refer to Patient Instructions section of EMR for full details.  Patient was counseled important signs and symptoms that should prompt return to medical care, changes in medications, dietary instructions, activity restrictions, and follow up appointments.   Follow-Up Appointments: Follow-up Information   Call Conni Slipper, MD. (For a follow up appointment)    Specialty:  Family Medicine   Contact information:   [place] [address] [place], DO 06/12/2014, 6:02 AM PGY-1, [place]

## 2014-06-10 NOTE — Progress Notes (Signed)
Patient discharge teaching given, including activity, diet, follow-up appoints, and medications. Patient verbalized understanding of all discharge instructions. IV access was d/c'd. Vitals are stable. Skin is intact except as charted in most recent assessments. Pt escorted out by NT, to be driven home by family.

## 2014-06-11 NOTE — Care Management Note (Signed)
    Page 1 of 1   06/11/2014     8:01:17 AM CARE MANAGEMENT NOTE 06/11/2014  Patient:  [name] [name], [name] [name]   Account Number:  [id number]  Date Initiated:  06/11/2014  Documentation initiated by:  Tomi Bamberger  Subjective/Objective Assessment:   dx cyclical vomiting  admit as observation.     Action/Plan:   Anticipated DC Date:  06/10/2014   Anticipated DC Plan:  [place]  CM consult      Choice offered to / List presented to:             Status of service:  Completed, signed off Medicare Important Message given?   (If response is "NO", the following Medicare IM given date fields will be blank) Date Medicare IM given:   Date Additional Medicare IM given:    Discharge Disposition:  HOME/SELF CARE  Per UR Regulation:  Reviewed for med. necessity/level of care/duration of stay  If discussed at [place] of Stay Meetings, dates discussed:    Comments:

## 2014-06-12 NOTE — Discharge Summary (Signed)
Family Medicine Teaching Service  Discharge Note : Attending Jeff Charnika Herbst MD Pager [phone] Inpatient Team Pager:  [phone]  I have reviewed this patient and the patient's chart and have discussed discharge planning with the resident at the time of discharge. I agree with the discharge plan as above.    

## 2014-07-01 ENCOUNTER — Encounter: Payer: Self-pay | Admitting: Family Medicine

## 2014-07-09 ENCOUNTER — Emergency Department (HOSPITAL_COMMUNITY)
Admission: EM | Admit: 2014-07-09 | Discharge: 2014-07-09 | Disposition: A | Discharge status: Home / Self Care | Payer: Medicare Other | Attending: Emergency Medicine | Admitting: Emergency Medicine

## 2014-07-09 ENCOUNTER — Telehealth: Payer: Self-pay | Admitting: Family Medicine

## 2014-07-09 ENCOUNTER — Encounter (HOSPITAL_COMMUNITY): Payer: Self-pay | Admitting: Emergency Medicine

## 2014-07-09 DIAGNOSIS — F411 Generalized anxiety disorder: Secondary | ICD-10-CM | POA: Insufficient documentation

## 2014-07-09 DIAGNOSIS — I129 Hypertensive chronic kidney disease with stage 1 through stage 4 chronic kidney disease, or unspecified chronic kidney disease: Secondary | ICD-10-CM | POA: Diagnosis not present

## 2014-07-09 DIAGNOSIS — R109 Unspecified abdominal pain: Secondary | ICD-10-CM

## 2014-07-09 DIAGNOSIS — K3184 Gastroparesis: Secondary | ICD-10-CM | POA: Insufficient documentation

## 2014-07-09 DIAGNOSIS — Z862 Personal history of diseases of the blood and blood-forming organs and certain disorders involving the immune mechanism: Secondary | ICD-10-CM | POA: Diagnosis not present

## 2014-07-09 DIAGNOSIS — N183 Chronic kidney disease, stage 3 unspecified: Secondary | ICD-10-CM | POA: Diagnosis not present

## 2014-07-09 DIAGNOSIS — G8929 Other chronic pain: Secondary | ICD-10-CM

## 2014-07-09 DIAGNOSIS — F17200 Nicotine dependence, unspecified, uncomplicated: Secondary | ICD-10-CM | POA: Diagnosis not present

## 2014-07-09 DIAGNOSIS — Z8701 Personal history of pneumonia (recurrent): Secondary | ICD-10-CM | POA: Diagnosis not present

## 2014-07-09 DIAGNOSIS — E1149 Type 2 diabetes mellitus with other diabetic neurological complication: Secondary | ICD-10-CM | POA: Insufficient documentation

## 2014-07-09 DIAGNOSIS — I252 Old myocardial infarction: Secondary | ICD-10-CM | POA: Diagnosis not present

## 2014-07-09 DIAGNOSIS — R011 Cardiac murmur, unspecified: Secondary | ICD-10-CM | POA: Insufficient documentation

## 2014-07-09 DIAGNOSIS — R1115 Cyclical vomiting syndrome unrelated to migraine: Secondary | ICD-10-CM | POA: Insufficient documentation

## 2014-07-09 DIAGNOSIS — R112 Nausea with vomiting, unspecified: Secondary | ICD-10-CM | POA: Diagnosis present

## 2014-07-09 DIAGNOSIS — R1013 Epigastric pain: Secondary | ICD-10-CM | POA: Diagnosis not present

## 2014-07-09 DIAGNOSIS — E1142 Type 2 diabetes mellitus with diabetic polyneuropathy: Secondary | ICD-10-CM | POA: Diagnosis not present

## 2014-07-09 DIAGNOSIS — I10 Essential (primary) hypertension: Secondary | ICD-10-CM | POA: Diagnosis not present

## 2014-07-09 DIAGNOSIS — Z79899 Other long term (current) drug therapy: Secondary | ICD-10-CM | POA: Insufficient documentation

## 2014-07-09 LAB — URINALYSIS, ROUTINE W REFLEX MICROSCOPIC
Bilirubin Urine: NEGATIVE
GLUCOSE, UA: NEGATIVE mg/dL
Ketones, ur: NEGATIVE mg/dL
LEUKOCYTES UA: NEGATIVE
Nitrite: NEGATIVE
PH: 5.5 (ref 5.0–8.0)
Protein, ur: NEGATIVE mg/dL
SPECIFIC GRAVITY, URINE: 1.01 (ref 1.005–1.030)
Urobilinogen, UA: 0.2 mg/dL (ref 0.0–1.0)

## 2014-07-09 LAB — CBC WITH DIFFERENTIAL/PLATELET
BASOS ABS: 0 10*3/uL (ref 0.0–0.1)
Basophils Relative: 0 % (ref 0–1)
EOS ABS: 0.1 10*3/uL (ref 0.0–0.7)
EOS PCT: 2 % (ref 0–5)
HCT: 30 % — ABNORMAL LOW (ref 36.0–46.0)
Hemoglobin: 10.3 g/dL — ABNORMAL LOW (ref 12.0–15.0)
LYMPHS ABS: 2.8 10*3/uL (ref 0.7–4.0)
LYMPHS PCT: 51 % — AB (ref 12–46)
MCH: 31.9 pg (ref 26.0–34.0)
MCHC: 34.3 g/dL (ref 30.0–36.0)
MCV: 92.9 fL (ref 78.0–100.0)
Monocytes Absolute: 0.4 10*3/uL (ref 0.1–1.0)
Monocytes Relative: 7 % (ref 3–12)
NEUTROS PCT: 40 % — AB (ref 43–77)
Neutro Abs: 2.2 10*3/uL (ref 1.7–7.7)
PLATELETS: 176 10*3/uL (ref 150–400)
RBC: 3.23 MIL/uL — AB (ref 3.87–5.11)
RDW: 13.9 % (ref 11.5–15.5)
WBC: 5.5 10*3/uL (ref 4.0–10.5)

## 2014-07-09 LAB — COMPREHENSIVE METABOLIC PANEL
ALBUMIN: 3.9 g/dL (ref 3.5–5.2)
ALK PHOS: 71 U/L (ref 39–117)
ALT: 9 U/L (ref 0–35)
AST: 16 U/L (ref 0–37)
Anion gap: 18 — ABNORMAL HIGH (ref 5–15)
BUN: 41 mg/dL — ABNORMAL HIGH (ref 6–23)
CALCIUM: 9.5 mg/dL (ref 8.4–10.5)
CO2: 18 mEq/L — ABNORMAL LOW (ref 19–32)
Chloride: 103 mEq/L (ref 96–112)
Creatinine, Ser: 2 mg/dL — ABNORMAL HIGH (ref 0.50–1.10)
GFR calc Af Amer: 33 mL/min — ABNORMAL LOW (ref >90)
GFR calc non Af Amer: 28 mL/min — ABNORMAL LOW (ref >90)
Glucose, Bld: 127 mg/dL — ABNORMAL HIGH (ref 70–99)
Potassium: 4.7 mEq/L (ref 3.7–5.3)
Sodium: 139 mEq/L (ref 137–147)
TOTAL PROTEIN: 7.2 g/dL (ref 6.0–8.3)
Total Bilirubin: 0.2 mg/dL — ABNORMAL LOW (ref 0.3–1.2)

## 2014-07-09 LAB — URINE MICROSCOPIC-ADD ON

## 2014-07-09 LAB — CBG MONITORING, ED: GLUCOSE-CAPILLARY: 104 mg/dL — AB (ref 70–99)

## 2014-07-09 LAB — LIPASE, BLOOD: Lipase: 166 U/L — ABNORMAL HIGH (ref 11–59)

## 2014-07-09 MED ORDER — LORAZEPAM 2 MG/ML IJ SOLN
1.0000 mg | Freq: Once | INTRAMUSCULAR | Status: AC
  Administered 2014-07-09: 1 mg via INTRAVENOUS
  Filled 2014-07-09: qty 1

## 2014-07-09 MED ORDER — SODIUM CHLORIDE 0.9 % IJ SOLN
10.0000 mL | Freq: Two times a day (BID) | INTRAMUSCULAR | Status: DC
Start: 2014-07-09 — End: 2014-07-09

## 2014-07-09 MED ORDER — SODIUM CHLORIDE 0.9 % IJ SOLN
10.0000 mL | INTRAMUSCULAR | Status: DC | PRN
  Administered 2014-07-09: 10 mL

## 2014-07-09 MED ORDER — SODIUM CHLORIDE 0.9 % IV BOLUS (SEPSIS)
2000.0000 mL | Freq: Once | INTRAVENOUS | Status: AC
  Administered 2014-07-09: 1000 mL via INTRAVENOUS

## 2014-07-09 MED ORDER — HYDROMORPHONE HCL PF 1 MG/ML IJ SOLN
1.0000 mg | Freq: Once | INTRAMUSCULAR | Status: AC
  Administered 2014-07-09: 1 mg via INTRAVENOUS
  Filled 2014-07-09: qty 1

## 2014-07-09 MED ORDER — PROMETHAZINE HCL 25 MG PO TABS: 25.0000 mg | ORAL_TABLET | Freq: Three times a day (TID) | ORAL | Status: DC | PRN

## 2014-07-09 MED ORDER — PROMETHAZINE HCL 25 MG/ML IJ SOLN
25.0000 mg | Freq: Once | INTRAMUSCULAR | Status: AC
  Administered 2014-07-09: 25 mg via INTRAVENOUS
  Filled 2014-07-09: qty 1

## 2014-07-09 MED ORDER — HEPARIN SOD (PORK) LOCK FLUSH 100 UNIT/ML IV SOLN
500.0000 [IU] | INTRAVENOUS | Status: DC
  Filled 2014-07-09: qty 5

## 2014-07-09 MED ORDER — HEPARIN SOD (PORK) LOCK FLUSH 100 UNIT/ML IV SOLN
500.0000 [IU] | INTRAVENOUS | Status: DC | PRN
  Administered 2014-07-09: 500 [IU]
  Filled 2014-07-09: qty 5

## 2014-07-09 MED ORDER — LORAZEPAM 1 MG PO TABS: 1.0000 mg | ORAL_TABLET | Freq: Two times a day (BID) | ORAL | Status: DC | PRN

## 2014-07-09 NOTE — Telephone Encounter (Signed)
I didn't get a lot information from her only that she was giving some medication.  I told her I would let her PCP know her was in the ER.  Derl Barrow, RN

## 2014-07-09 NOTE — ED Notes (Signed)
Pt reports that she has a hx of cyclic vomiting with a port. States that she had an episode this Sunday but felt better yesterday. States that today she started feeling worse and is having severe abd pain.

## 2014-07-09 NOTE — ED Provider Notes (Signed)
CSN: [id number]     Arrival date & time 07/09/14  1228 History   First MD Initiated Contact with Patient 07/09/14 1235     Chief Complaint  Patient presents with  . Vomiting     (Consider location/radiation/quality/duration/timing/severity/associated sxs/prior Treatment) HPI 48 year old female with cyclic vomiting syndrome presents with recurrent vomiting and abdominal pain. Her symptoms started this morning. 2 days ago she had similar symptoms but they resolved. She used a phenergan suppository and oral Ativan with no relief. Her abdominal pain is epigastric and feel like her multiple prior pain she gets with her cyclic vomiting. She denies any fevers or chills. No urinary symptoms. The pain is a severe sharp and stabbing pain. It is worse when she stands straight up and leaning over helps.  Past Medical History  Diagnosis Date  . PANCREATITIS 05/09/2008  . Cyclic vomiting syndrome   . Hypertension   . Complication of anesthesia     "problems waking up"  . Heart murmur   . Myocardial infarction 07/2007    "they say I've had a silent one; I don't know"  . Pneumonia   . Anemia   . Blood transfusion   . GERD (gastroesophageal reflux disease)   . Anxiety   . Depression   . Smoker   . Marijuana smoker, continuous   . NEUROPATHY, DIABETIC 02/23/2007  . Type II diabetes mellitus   . Diabetic gastroparesis     /e-chart  . Chronic kidney disease (CKD), stage III (moderate)    Past Surgical History  Procedure Laterality Date  . Port-a-cath placement  ~ 2008    right chest; "poor access; frequent hospitalizations"  . Laparotomy and lysis of adhesions    . Abdominal hysterectomy  02/2002  . Cholecystectomy  1980's  . Cardiac catheterization    . Dilation and evacuation  X3  . Left hand surgery    . Cataract extraction w/ intraocular lens  implant, bilateral    . Colonoscopy    . Upper gi endoscopy     Family History  Problem Relation Age of Onset  . Diabetes type II Mother   .  Diabetes Mother   . Hypertension Mother   . Diabetes type II Sister   . Diabetes Sister   . Hypertension Sister   . Hypertension Brother    History  Substance Use Topics  . Smoking status: Current Some Day Smoker -- 0.50 packs/day for 22 years    Types: Cigarettes  . Smokeless tobacco: Never Used  . Alcohol Use: No   OB History   Grav Para Term Preterm Abortions TAB SAB Ect Mult Living                 Review of Systems  Constitutional: Negative for fever.  Gastrointestinal: Positive for nausea, vomiting and abdominal pain. Negative for diarrhea and blood in stool.  All other systems reviewed and are negative.     Allergies  Bee venom; Acetaminophen; Novolog; and Erythromycin  Home Medications   Prior to Admission medications   Medication Sig Start Date End Date Taking? Authorizing Provider  carvedilol (COREG) 25 MG tablet Take 1 tablet (25 mg total) by mouth 2 (two) times daily with a meal. 11/15/13   Hilton Sinclair, MD  cyclobenzaprine (FLEXERIL) 5 MG tablet Take 1 tablet (5 mg total) by mouth 3 (three) times daily as needed. For spasms 04/16/14   Hilton Sinclair, MD  diclofenac sodium (VOLTAREN) 1 % GEL Apply 2 g topically 4 (four)  times daily as needed (for pain). 01/30/14   Hilton Sinclair, MD  hydrochlorothiazide (HYDRODIURIL) 25 MG tablet Take 1 tablet (25 mg total) by mouth daily. 05/15/14   Langston Masker, MD  insulin glargine (LANTUS) 100 UNIT/ML injection Inject 0.2 mLs (20 Units total) into the skin every morning. 05/15/14   Nobie Putnam, DO  lisinopril (PRINIVIL,ZESTRIL) 40 MG tablet Take 40 mg by mouth every morning. 04/03/14   Hilton Sinclair, MD  LORazepam (ATIVAN) 1 MG tablet Take 1 tablet (1 mg total) by mouth 2 (two) times daily as needed for anxiety. 05/24/14   Hilton Sinclair, MD  LORazepam (ATIVAN) 1 MG tablet Take 1 tablet (1 mg total) by mouth 2 (two) times daily as needed (nausea). 06/10/14   Coral Spikes, DO  oxyCODONE (OXY  IR/ROXICODONE) 5 MG immediate release tablet Take 1 tablet (5 mg total) by mouth daily as needed. 05/24/14   Hilton Sinclair, MD  promethazine (PHENERGAN) 25 MG suppository Place 25 mg rectally every 8 (eight) hours as needed for nausea or vomiting.    Historical Provider, MD  promethazine (PHENERGAN) 25 MG tablet Take 1 tablet (25 mg total) by mouth every 8 (eight) hours as needed for nausea. 05/15/14   Nobie Putnam, DO  SUMAtriptan (IMITREX) 50 MG tablet Take 50 mg by mouth every 2 (two) hours as needed for migraine. May repeat in 2 hours if headache persists or recurs.    Historical Provider, MD   BP 146/95  Pulse 86  Temp(Src) 97.8 F (36.6 C) (Oral)  Resp 13  SpO2 100% Physical Exam  Nursing note and vitals reviewed. Constitutional: She is oriented to person, place, and time. She appears well-developed and well-nourished.  HENT:  Head: Normocephalic and atraumatic.  Right Ear: External ear normal.  Left Ear: External ear normal.  Nose: Nose normal.  Eyes: Right eye exhibits no discharge. Left eye exhibits no discharge.  Cardiovascular: Normal rate, regular rhythm and normal heart sounds.   Pulmonary/Chest: Effort normal and breath sounds normal.  Abdominal: Soft. There is tenderness in the epigastric area.  Neurological: She is alert and oriented to person, place, and time.  Skin: Skin is warm and dry.    ED Course  Procedures (including critical care time) Labs Review Labs Reviewed  CBC WITH DIFFERENTIAL - Abnormal; Notable for the following:    RBC 3.23 (*)    Hemoglobin 10.3 (*)    HCT 30.0 (*)    Neutrophils Relative % 40 (*)    Lymphocytes Relative 51 (*)    All other components within normal limits  COMPREHENSIVE METABOLIC PANEL - Abnormal; Notable for the following:    CO2 18 (*)    Glucose, Bld 127 (*)    BUN 41 (*)    Creatinine, Ser 2.00 (*)    Total Bilirubin 0.2 (*)    GFR calc non Af Amer 28 (*)    GFR calc Af Amer 33 (*)    Anion gap 18 (*)     All other components within normal limits  LIPASE, BLOOD - Abnormal; Notable for the following:    Lipase 166 (*)    All other components within normal limits  URINALYSIS, ROUTINE W REFLEX MICROSCOPIC - Abnormal; Notable for the following:    Hgb urine dipstick TRACE (*)    All other components within normal limits  CBG MONITORING, ED - Abnormal; Notable for the following:    Glucose-Capillary 104 (*)    All other components within normal  limits  URINE MICROSCOPIC-ADD ON    Imaging Review No results found.   EKG Interpretation None      MDM   Final diagnoses:  Non-intractable cyclical vomiting with nausea  Chronic abdominal pain    Patient feels significantly improved after IV pain and nausea medicine. Her creatinine of 2 is near her chronic baseline. She does have a bicarbonate of 18 which is lower than what she normally presents. However she has received 2 L of IV fluids and is able to eat and drink in the ER. I do not feel she needs to be brought into the hospital as she does not have intractable vomiting. Her abdominal exam is benign and her pain appears chronic and I do not feel repeat imaging is warranted at this time. We'll refill her oral Phenergan. She states she is out of ativan as well but has follow up in 2 days with PCP. Will give short refill to prevent withdrawals.    Ephraim Hamburger, MD 07/09/14 [phone]

## 2014-07-09 NOTE — ED Notes (Signed)
Dr.Goldston at bedside; Pt given water and ginger ale for PO challenge

## 2014-07-09 NOTE — Telephone Encounter (Signed)
Did she need someone to call her back or just want to inform us? She should have f/u with me.  Hilton Sinclair, MD

## 2014-07-09 NOTE — ED Notes (Signed)
Pt tolerating fluids and crackers.

## 2014-07-09 NOTE — Discharge Instructions (Signed)

## 2014-07-09 NOTE — Telephone Encounter (Signed)
Pt called very upset and needs the nurse to call her ASAP. She didn't go into any details. Please call her at [phone]. jw

## 2014-07-09 NOTE — Telephone Encounter (Signed)
Returned pt's call; she stated she was at the emergency room with complaints of severe vomiting.  Will inform PCP.  Derl Barrow, RN'

## 2014-07-12 ENCOUNTER — Ambulatory Visit (INDEPENDENT_AMBULATORY_CARE_PROVIDER_SITE_OTHER): Payer: Medicare Other | Admitting: Family Medicine

## 2014-07-12 ENCOUNTER — Telehealth: Payer: Self-pay | Admitting: Family Medicine

## 2014-07-12 ENCOUNTER — Encounter: Payer: Self-pay | Admitting: Family Medicine

## 2014-07-12 VITALS — BP 114/74 | Ht 61.0 in | Wt 161.0 lb

## 2014-07-12 DIAGNOSIS — M67919 Unspecified disorder of synovium and tendon, unspecified shoulder: Secondary | ICD-10-CM | POA: Diagnosis not present

## 2014-07-12 DIAGNOSIS — M719 Bursopathy, unspecified: Secondary | ICD-10-CM | POA: Diagnosis not present

## 2014-07-12 DIAGNOSIS — S43429A Sprain of unspecified rotator cuff capsule, initial encounter: Secondary | ICD-10-CM | POA: Diagnosis not present

## 2014-07-12 DIAGNOSIS — M75101 Unspecified rotator cuff tear or rupture of right shoulder, not specified as traumatic: Secondary | ICD-10-CM

## 2014-07-12 DIAGNOSIS — R1115 Cyclical vomiting syndrome unrelated to migraine: Secondary | ICD-10-CM

## 2014-07-12 DIAGNOSIS — S43421A Sprain of right rotator cuff capsule, initial encounter: Secondary | ICD-10-CM

## 2014-07-12 MED ORDER — METHYLPREDNISOLONE ACETATE 40 MG/ML IJ SUSP
40.0000 mg | Freq: Once | INTRAMUSCULAR | Status: AC
  Administered 2014-07-12: 40 mg via INTRA_ARTICULAR

## 2014-07-12 MED ORDER — LORAZEPAM 1 MG PO TABS: 1.0000 mg | ORAL_TABLET | Freq: Two times a day (BID) | ORAL | Status: DC | PRN

## 2014-07-12 NOTE — Telephone Encounter (Signed)
Was prescribed Ativan at ED, given 5 pills. Patient request an additional refill to last her until her appt with Dr. Darene Lamer on 7/23. Please call [phone] once completed.

## 2014-07-12 NOTE — Progress Notes (Signed)
  [name] [name] - 48 y.o. female MRN [id number]  Date of birth: 1966-11-17  SUBJECTIVE:     48 year old female presents for followup of right shoulder pain.  Patient seen recently in April by Dr. [name].  At that time physical exam and ultrasound findings consistent with rotator cuff syndrome.  This was given an exercise regimen and instructed followup.  Today she presents for followup reporting continued right shoulder pain.  He states her pain is severe and interferes with her daily activities.  She has had no improvement since her prior visit.  Pain is exacerbated by motion and relieved by rest.  She is not currently taking any medications for this.  ROS:     Per HPI  PERTINENT  PMH / PSH FH / / SH:  Past Medical, Surgical, Social, and Family History Reviewed.  Pertinent Historical Findings include: Recent rotator cuff syndrome 4/15  OBJECTIVE: BP 114/74  Ht 5\' 1"  (1.549 m)  Wt 161 lb (73.029 kg)  BMI 30.44 kg/m2  Physical Exam:  Vital signs are reviewed. General: Well-appearing African American female, NAD. MSK - Right Shoulder: Inspection reveals no abnormalities, atrophy or asymmetry. Palpation is normal with no tenderness over AC joint or bicipital groove. ROM - active and passive range of motion decreased in flexion and abduction.   Rotator cuff strength - 4/5 supraspinatus.  Infraspinatus/teres minor 4/5.  Subscapularis 5/5.  + Neer and + Hawkins. Speeds and Yergason's tests normal. + Painful arc sign.  Procedure: Subacromial bursa injection, right shoulder Consent signed and scanned into record. Medication:  1 cc Solumedrol  4 cc Lidocaine 1% without epi Preparation: area cleansed with betadine. Time Out taken  Injection  Landmarks identified Above medication injected using a standard posterior approach  Patient tolerated well without bleeding or paresthesias  Patient had good range of motion of joint after injection  ASSESSMENT & PLAN: See problem based charting & AVS  for pt instructions.

## 2014-07-12 NOTE — Assessment & Plan Note (Signed)
After lengthy discussion, patient elected to have injection today as she has previously had a positive response. Subacromial bursa injection done today. Patient to followup as needed or she fails to improve.

## 2014-07-12 NOTE — Telephone Encounter (Signed)
Will leave rx for another 10 of ativan up front. Please call and let her know, and remind her this is not for daily use but for as needed for intractable nausea. If she starts having worsening nausea, she can call and be seen sooner in our sameday clinic which we have daily for purposes like these.  ThxHilton Sinclair, MD

## 2014-07-15 NOTE — Telephone Encounter (Signed)
LMOVM on Friday, but forgot to document. Vasily Fedewa, [name]

## 2014-07-15 NOTE — Progress Notes (Signed)
[facility] Attending Note: I have seen and examined this patient. I have discussed this patient with the resident and reviewed the assessment and plan as documented above. I agree with the resident's findings and plan.  

## 2014-07-18 ENCOUNTER — Ambulatory Visit (INDEPENDENT_AMBULATORY_CARE_PROVIDER_SITE_OTHER): Payer: Medicare Other | Admitting: Family Medicine

## 2014-07-18 ENCOUNTER — Encounter: Payer: Self-pay | Admitting: Family Medicine

## 2014-07-18 VITALS — BP 124/60 | HR 72 | Wt 160.0 lb

## 2014-07-18 DIAGNOSIS — E1329 Other specified diabetes mellitus with other diabetic kidney complication: Secondary | ICD-10-CM | POA: Diagnosis not present

## 2014-07-18 DIAGNOSIS — R252 Cramp and spasm: Secondary | ICD-10-CM | POA: Diagnosis not present

## 2014-07-18 DIAGNOSIS — E1322 Other specified diabetes mellitus with diabetic chronic kidney disease: Secondary | ICD-10-CM

## 2014-07-18 DIAGNOSIS — R1115 Cyclical vomiting syndrome unrelated to migraine: Secondary | ICD-10-CM

## 2014-07-18 DIAGNOSIS — F411 Generalized anxiety disorder: Secondary | ICD-10-CM

## 2014-07-18 DIAGNOSIS — N183 Chronic kidney disease, stage 3 unspecified: Secondary | ICD-10-CM

## 2014-07-18 DIAGNOSIS — E1365 Other specified diabetes mellitus with hyperglycemia: Principal | ICD-10-CM

## 2014-07-18 DIAGNOSIS — IMO0002 Reserved for concepts with insufficient information to code with codable children: Secondary | ICD-10-CM

## 2014-07-18 HISTORY — DX: Cramp and spasm: R25.2

## 2014-07-18 LAB — BASIC METABOLIC PANEL
BUN: 53 mg/dL — ABNORMAL HIGH (ref 6–23)
CO2: 22 meq/L (ref 19–32)
CREATININE: 2.7 mg/dL — AB (ref 0.50–1.10)
Calcium: 9.6 mg/dL (ref 8.4–10.5)
Chloride: 107 mEq/L (ref 96–112)
Glucose, Bld: 53 mg/dL — ABNORMAL LOW (ref 70–99)
Potassium: 4.7 mEq/L (ref 3.5–5.3)
SODIUM: 136 meq/L (ref 135–145)

## 2014-07-18 LAB — LIPID PANEL
Cholesterol: 161 mg/dL (ref 0–200)
HDL: 57 mg/dL (ref >39)
LDL Cholesterol: 89 mg/dL (ref 0–99)
TRIGLYCERIDES: 77 mg/dL (ref <150)
Total CHOL/HDL Ratio: 2.8 Ratio
VLDL: 15 mg/dL (ref 0–40)

## 2014-07-18 LAB — CBC
HCT: 30.1 % — ABNORMAL LOW (ref 36.0–46.0)
Hemoglobin: 10.2 g/dL — ABNORMAL LOW (ref 12.0–15.0)
MCH: 31.2 pg (ref 26.0–34.0)
MCHC: 33.9 g/dL (ref 30.0–36.0)
MCV: 92 fL (ref 78.0–100.0)
PLATELETS: 242 10*3/uL (ref 150–400)
RBC: 3.27 MIL/uL — AB (ref 3.87–5.11)
RDW: 15.3 % (ref 11.5–15.5)
WBC: 5.7 10*3/uL (ref 4.0–10.5)

## 2014-07-18 LAB — TSH: TSH: 0.463 u[IU]/mL (ref 0.350–4.500)

## 2014-07-18 LAB — POCT GLYCOSYLATED HEMOGLOBIN (HGB A1C): Hemoglobin A1C: 8.7

## 2014-07-18 MED ORDER — LORAZEPAM 1 MG PO TABS: 1.0000 mg | ORAL_TABLET | Freq: Two times a day (BID) | ORAL | Status: DC | PRN

## 2014-07-18 MED ORDER — CARVEDILOL 25 MG PO TABS: 25.0000 mg | ORAL_TABLET | Freq: Two times a day (BID) | ORAL | Status: DC

## 2014-07-18 MED ORDER — OXYCODONE HCL 5 MG PO TABS
5.0000 mg | ORAL_TABLET | Freq: Every day | ORAL | Status: DC | PRN
Start: 2014-07-18 — End: 2014-08-14

## 2014-07-18 MED ORDER — LISINOPRIL 40 MG PO TABS: 40.0000 mg | ORAL_TABLET | Freq: Every day | ORAL | Status: DC

## 2014-07-18 MED ORDER — INSULIN GLARGINE 100 UNIT/ML ~~LOC~~ SOLN: 20.0000 [IU] | Freq: Every morning | SUBCUTANEOUS | Status: DC

## 2014-07-18 NOTE — Progress Notes (Signed)
Patient ID: [name] [name], female   DOB: January 08, 1966, 48 y.o.   MRN: [id number] Subjective:   CC: Follow up diabetes, foot cramps, med refill  HPI:   Follow up diabetes Patient takes lantus 20 units daily and does not miss doses. She reports blood sugars this month in the 290s. She has also had hypoglycemic symptoms but has not checked bg at that time (anxiety, sweating). She denies chest pain or shortness of breath. She does not report polyuria or polydipsia. Her last A1c was 9.4 in April.   Leg cramping Patient reports 4 days of night-time left foot cramping that is so bad it wakes her from sleep. The foot and leg "lock" and her calf tightens up. It is painful at anterior ankle. She denies redness, swelling, fevers, or chills. She denies trauma. Muscle relaxer does not help.  Med refill She needs refills today on oxycodone, ativan, lisinopril, coreg, and lantus.  She takes all with no reported side effects. She takes ativan twice daily most days.  Review of Systems - Per HPI.   Smoking status: Smokes marijuana regularly    Objective:  Physical Exam BP 124/60  Pulse 72  Wt 160 lb (72.576 kg) GEN: NAD CV: RRR no m/r/g PULM: CTAB ABD: S/nt/nd EXTR: Left foot normal appearing with no calf swelling or tenderness; mild anterior ankle tenderness; No ankle swelling. 4+/5 dorsiflexion, plantar flexion, inversion and eversion of left ankle  Assessment:     [name] [name] is a 48 y.o. female with h/o cyclic vomiting syndrome, depression, and anxiety here for f/u of diabetes, eval of foot cramping, and med refill.    Plan:    Diabetes followup Poorly controlled last A1c 9.4 in April. Patient reports cutting out Snapple. Reports high blood sugars in the upper 200s as well as symptoms of hypoglycemia with no checked blood sugars. -A1c today 8.7. -Refilled Lantus. Continue 20 units -Asked patient to keep a log 2-3 times a week of fasting blood sugar as well as any time she feels  hypoglycemic. - provided information on diabetic diet -She reports she's not yet due for annual exam. -Annual foot exam at next visit. - fasting lipid panel today  Leg cramping Uncertain etiology, differential includes thyroid dysfunction, metabolic dysfunction, hypokalemia, or medication side effect. No erythema, swelling, increased rubor, or severe tenderness. Normal range of motion. No trauma. No obvious inciting medication on med list -Will check  BMET, TSH and CBC today And call patient with any abnormal values -Soak in warm bath prior to bed nightly  Anxiety Chronic, have not fully discussed etiology with patient. On chronic benzos for this along with intractable nausea. Uses benzo daily. Discussed this is not safest most effective alternative, and that for intractable vomiting benzo use is not daily but only as needed. - NEeds follow up in a few weeks when convenient to discuss anxiety.  - May try slow taper in the future, to include scheduling benzodiazepine at first and decreasing dose each visit. Alternative is to find lowest necessary dose.  # Health Maintenance:  - Needs follow up visit for this. - Refilled oxycodone, ativan, lisinopril, lantus, and coreg today. - Needs follow up in next few weeks when convenient to discuss anxiety. - At f/u, review meds and problem list.  Follow-up: Follow up in next few weeks when convenient to discuss anxiety.   Hilton Sinclair, MD [place]

## 2014-07-18 NOTE — Assessment & Plan Note (Signed)
Chronic, have not fully discussed etiology with patient. On chronic benzos for this along with intractable nausea. Uses benzo daily. Discussed this is not safest most effective alternative, and that for intractable vomiting benzo use is not daily but only as needed. - NEeds follow up in a few weeks when convenient to discuss anxiety.  - May try slow taper in the future, to include scheduling benzodiazepine at first and decreasing dose each visit. Alternative is to find lowest necessary dose.

## 2014-07-18 NOTE — Patient Instructions (Addendum)
I have refilled oxycodone (be aware this can cause constipation), ativan, lisinopril, lantus, and coreg. Your A1c today is improved to 8.7. Great job! Continue keeping sweets to a minimum. See recommendations below. Be sure you have your annual eye exam. We are chekcing labs today and I will call if any are NOT normal. Check blood sugar fasting 2-3 times weekly and also any time you feel like blood sugar is low. For leg cramping, we are checking basic labs and I will call you if these are NOT normal. In the meantime, soak in warm bath prior to bed nightly. FOllow up when available to discuss anxiety.  Diet Recommendations for Diabetes   Starchy (carb) foods include: Bread, rice, pasta, potatoes, corn, crackers, bagels, muffins, all baked goods.  (Fruits, milk, and yogurt also have carbohydrate, but most of these foods will not spike your blood sugar as the starchy foods will.)  A few fruits do cause high blood sugars; use small portions of bananas (limit to 1/2 at a time), grapes, and most tropical fruits.    Protein foods include: Meat, fish, poultry, eggs, dairy foods, and beans such as pinto and kidney beans (beans also provide carbohydrate).   1. Eat at least 3 meals and 1-2 snacks per day. Never go more than 4-5 hours while awake without eating.  2. Limit starchy foods to TWO per meal and ONE per snack. ONE portion of a starchy  food is equal to the following:   - ONE slice of bread (or its equivalent, such as half of a hamburger bun).   - 1/2 cup of a "scoopable" starchy food such as potatoes or rice.   - 15 grams of carbohydrate as shown on food label.  3. Both lunch and dinner should include a protein food, a carb food, and vegetables.   - Obtain twice as many veg's as protein or carbohydrate foods for both lunch and dinner.   - Fresh or frozen veg's are best.   - Try to keep frozen veg's on hand for a quick vegetable serving.    4. Breakfast should always include protein.

## 2014-07-18 NOTE — Assessment & Plan Note (Signed)
Poorly controlled last A1c 9.4 in April. Patient reports cutting out Snapple. Reports high blood sugars in the upper 200s as well as symptoms of hypoglycemia with no checked blood sugars. -A1c today 8.7. -Refilled Lantus. Continue 20 units -Asked patient to keep a log 2-3 times a week of fasting blood sugar as well as any time she feels hypoglycemic. - provided information on diabetic diet -She reports she's not yet due for annual exam. -Annual foot exam at next visit. - fasting lipid panel today

## 2014-07-18 NOTE — Assessment & Plan Note (Signed)
Uncertain etiology, differential includes thyroid dysfunction, metabolic dysfunction, hypokalemia, or medication side effect. No erythema, swelling, increased rubor, or severe tenderness. Normal range of motion. No trauma. No obvious inciting medication on med list -Will check  BMET, TSH and CBC today And call patient with any abnormal values -Soak in warm bath prior to bed nightly

## 2014-07-19 ENCOUNTER — Telehealth: Payer: Self-pay | Admitting: Family Medicine

## 2014-07-19 NOTE — Telephone Encounter (Signed)
Pt informed and agreeable. [name] [name]  

## 2014-07-19 NOTE — Telephone Encounter (Signed)
Please call to let [name] [name] know her kidney function is mildly elevated and the ratio is such that I worry about her being dehydrated, and her blood sugar yesterday was 53 which is low. Please have her focus on eating three meals daily and staying hydrated. We should repeat this test of kidney function when I see her next in a few weeks.  Hilton Sinclair, MD

## 2014-07-22 ENCOUNTER — Telehealth: Payer: Self-pay | Admitting: Family Medicine

## 2014-07-22 NOTE — Telephone Encounter (Signed)
Called to discuss cholesterol with patient. Based on her choleserol, BP, smoker status, and that she is a diabetic, her 10 year ASCVD risk is 9.9% putting her in a category of risk that would benefit from high-intensity statin. Discussed this with her and recommended lipitor 40mg  daily or crestor. Pt would like to think about this and look into side effect profile prior to starting. I offered to send letter with this information so that she can look these up prior to our next visit in August.   Hilton Sinclair, MD

## 2014-07-24 ENCOUNTER — Telehealth: Payer: Self-pay | Admitting: Family Medicine

## 2014-07-24 NOTE — Telephone Encounter (Signed)
Patient she has a yeast infection and is requesting Diflucan to be called into her pharmacy.

## 2014-07-24 NOTE — Telephone Encounter (Signed)
Please let her know she will need a sameday visit to evaluate symptoms and for a wet prep. In the mean time, she can try monastat over-the-counter. Thank you.  Hilton Sinclair, MD

## 2014-07-25 NOTE — Telephone Encounter (Signed)
Called pt but couldn't leave a message.  Please give message from MD when she calls back. Thanks [employer]

## 2014-07-26 ENCOUNTER — Ambulatory Visit: Payer: Medicare Other | Admitting: Family Medicine

## 2014-08-07 DIAGNOSIS — N183 Chronic kidney disease, stage 3 unspecified: Secondary | ICD-10-CM | POA: Diagnosis not present

## 2014-08-07 DIAGNOSIS — I10 Essential (primary) hypertension: Secondary | ICD-10-CM | POA: Diagnosis not present

## 2014-08-07 DIAGNOSIS — D649 Anemia, unspecified: Secondary | ICD-10-CM | POA: Diagnosis not present

## 2014-08-07 DIAGNOSIS — R809 Proteinuria, unspecified: Secondary | ICD-10-CM | POA: Diagnosis not present

## 2014-08-14 ENCOUNTER — Other Ambulatory Visit (HOSPITAL_COMMUNITY)
Admission: RE | Admit: 2014-08-14 | Discharge: 2014-08-14 | Disposition: A | Discharge status: Home / Self Care | Payer: Medicare Other | Source: Ambulatory Visit | Attending: Family Medicine | Admitting: Family Medicine

## 2014-08-14 ENCOUNTER — Encounter: Payer: Self-pay | Admitting: Family Medicine

## 2014-08-14 ENCOUNTER — Ambulatory Visit (INDEPENDENT_AMBULATORY_CARE_PROVIDER_SITE_OTHER): Payer: Medicare Other | Admitting: Family Medicine

## 2014-08-14 VITALS — BP 137/84 | HR 73 | Temp 98.1°F | Wt 163.0 lb

## 2014-08-14 DIAGNOSIS — Z789 Other specified health status: Secondary | ICD-10-CM | POA: Diagnosis not present

## 2014-08-14 DIAGNOSIS — E785 Hyperlipidemia, unspecified: Secondary | ICD-10-CM

## 2014-08-14 DIAGNOSIS — Z113 Encounter for screening for infections with a predominantly sexual mode of transmission: Secondary | ICD-10-CM | POA: Insufficient documentation

## 2014-08-14 DIAGNOSIS — Z202 Contact with and (suspected) exposure to infections with a predominantly sexual mode of transmission: Secondary | ICD-10-CM

## 2014-08-14 DIAGNOSIS — N898 Other specified noninflammatory disorders of vagina: Secondary | ICD-10-CM

## 2014-08-14 DIAGNOSIS — Z20828 Contact with and (suspected) exposure to other viral communicable diseases: Secondary | ICD-10-CM | POA: Diagnosis not present

## 2014-08-14 DIAGNOSIS — R1115 Cyclical vomiting syndrome unrelated to migraine: Secondary | ICD-10-CM

## 2014-08-14 DIAGNOSIS — Z9189 Other specified personal risk factors, not elsewhere classified: Secondary | ICD-10-CM

## 2014-08-14 LAB — POCT WET PREP (WET MOUNT): Clue Cells Wet Prep Whiff POC: NEGATIVE

## 2014-08-14 MED ORDER — LORAZEPAM 0.5 MG PO TABS: 0.7500 mg | ORAL_TABLET | Freq: Two times a day (BID) | ORAL | Status: DC

## 2014-08-14 MED ORDER — OXYCODONE HCL 5 MG PO TABS: 5.0000 mg | ORAL_TABLET | Freq: Every day | ORAL | Status: DC | PRN

## 2014-08-14 MED ORDER — PROMETHAZINE HCL 25 MG PO TABS: 25.0000 mg | ORAL_TABLET | Freq: Three times a day (TID) | ORAL | Status: DC | PRN

## 2014-08-14 NOTE — Progress Notes (Signed)
Patient ID: RAEDENE ARAGONES, female   DOB: 1966-12-01, 48 y.o.   MRN: [id number]  Subjective:   CC: Vaginal irritation, medication refill  HPI:   Vaginal irritation She reports being treated for yeast infection 2 weeks ago with "3 courses" of fluconazole (last dose last week), with much improvement in vaginal itching. She still has tingling in her vagina and wants to be sure it has cleared. She denies new abdominal pain, fevers, chills, or itching. She has her normal amount of nausea. She recently changed soaps. She is still having mild discharge. She has been sexually active with the same female partner for 12 years but still worries. They use condoms every time.   Med refill She would like refills on lisinopril, ativan, promethazine, and oxycodone.  Ativan - She reports taking this whenever she feels nausea coming on. While she states she has had 3 'episodes' with vomiting since she last saw me, she reports nausea much more frequently. She takes ativan 1-2 times 6/7 days weekly. Oxycodone - Takes if she feels "sick." Of 20 tabs rx'ed 7/23 (27 days ago), she has 2 left, meaning she used 18 tablets in 27 days (using 2 every 3 days).  Review of Systems - Per HPI. Additionally, she wants to discuss cholesterol medication.  Smoking status: Current every day smoker    Objective:  Physical Exam BP 137/84  Pulse 73  Temp(Src) 98.1 F (36.7 C) (Oral)  Wt 163 lb (73.936 kg) GEN: NAD GU: External genitalia WNL; cervix not seen, moderate thick white discharge, no tenderness,  Bimanual exam with absent uterus and no masses palpated ABD: Soft, nondistended; mild epigastric tenderness    Assessment:     AHNNA TREVETT is a 48 y.o. female here for vaginal irritation and med refill.    Plan:     Vaginal irritation Moderate discharge on exam though symptoms not convincing for infection. No fevers, chills, or abdominal pain. Likely normal vaginal discharge. - Wet prep, GC/Chlamydia, and  HIV/RPR. - Due for pap smear but h/o hysterectomy with cervix removal due to fibroids.  Medication refill Pt using for cyclic vomiting but Concern for dependence with daily use of benzo and nearly daily use of narcotic. - Discussed that cyclic vomiting is not an indication for daily benzo and narcotic use and that we would need to taper use over time, slowly. She is amenable. - Today, will start by changing ativan from how she is using it (1mg  BID most days) to 0.75mg  BID scheduled to take the anxiety of deciding when to use her PRN. - At next visit will continue to taper benzo or begin to taper narcotic. - Rx'ed ativan and oxycodone for 2 months. Also rx'ed phenergan #30 tabs. - Discussed that we cannot tack med refill for benzo and narcotics onto any visit. We need regular dedicated visits to these medications to discuss appropriate use and alternative management for cyclic vomiting and anxiety. Asked pt to make follow up appointment for this.  Cholesterol For now, will continue to work on diet/exercise as pt decided not to start medication today. - Recheck cholesterol in 3 months and rediscuss.   # Health Maintenance: Not discussed  Follow-up: Follow up when next available to discus anxiety.   Hilton Sinclair, MD [place]

## 2014-08-14 NOTE — Patient Instructions (Addendum)
Good to see you.  You do not need a pap smear because of your hysterectomy. We will get labs today and I'll call if anything is NOT normal.  We are doing refills today. Make an appointment with me to discuss anxiety. You will take ativan 0.75mg  twice daily every day. I have prescribed enough for this. It is up to you to make it last. Do not take more than this prescribed amount.   Best,  Hilton Sinclair, MD

## 2014-08-15 DIAGNOSIS — N898 Other specified noninflammatory disorders of vagina: Secondary | ICD-10-CM | POA: Insufficient documentation

## 2014-08-15 DIAGNOSIS — Z9189 Other specified personal risk factors, not elsewhere classified: Secondary | ICD-10-CM

## 2014-08-15 DIAGNOSIS — E7800 Pure hypercholesterolemia, unspecified: Secondary | ICD-10-CM

## 2014-08-15 DIAGNOSIS — E1169 Type 2 diabetes mellitus with other specified complication: Secondary | ICD-10-CM | POA: Insufficient documentation

## 2014-08-15 DIAGNOSIS — E785 Hyperlipidemia, unspecified: Secondary | ICD-10-CM | POA: Insufficient documentation

## 2014-08-15 HISTORY — DX: Other specified personal risk factors, not elsewhere classified: Z91.89

## 2014-08-15 HISTORY — DX: Hyperlipidemia, unspecified: E78.5

## 2014-08-15 HISTORY — DX: Pure hypercholesterolemia, unspecified: E78.00

## 2014-08-15 HISTORY — DX: Type 2 diabetes mellitus with other specified complication: E11.69

## 2014-08-15 LAB — RPR

## 2014-08-15 LAB — HIV ANTIBODY (ROUTINE TESTING W REFLEX): HIV 1&2 Ab, 4th Generation: NONREACTIVE

## 2014-08-15 MED ORDER — LISINOPRIL 40 MG PO TABS: 40.0000 mg | ORAL_TABLET | Freq: Every day | ORAL | Status: DC

## 2014-08-15 NOTE — Assessment & Plan Note (Signed)
Over the phone after lab check in July, discussed this with her and recommended lipitor 40mg  daily or crestor. Discussed again today as pt wanted to think about it. For now, will continue to work on diet/exercise as pt decided not to start medication today. - Recheck cholesterol in 3 months and rediscuss.

## 2014-08-15 NOTE — Assessment & Plan Note (Signed)
Pt using for cyclic vomiting but Concern for dependence with daily use of benzo and nearly daily use of narcotic. - Discussed that cyclic vomiting is not an indication for daily benzo and narcotic use and that we would need to taper use over time, slowly. She is amenable. - Today, will start by changing ativan from how she is using it (1mg  BID most days) to 0.75mg  BID scheduled to take the anxiety of deciding when to use her PRN. - At next visit will continue to taper benzo or begin to taper narcotic. - Rx'ed ativan and oxycodone for 2 months. Also rx'ed phenergan #30 tabs. - Discussed that we cannot tack med refill for benzo and narcotics onto any visit. We need regular dedicated visits to these medications to discuss appropriate use and alternative management for cyclic vomiting and anxiety. Asked pt to make follow up appointment for this.

## 2014-08-15 NOTE — Assessment & Plan Note (Signed)
Moderate discharge on exam though symptoms not convincing for infection. No fevers, chills, or abdominal pain. Likely normal vaginal discharge. - Wet prep, GC/Chlamydia, and HIV/RPR. - Due for pap smear but h/o hysterectomy with cervix removal due to fibroids.

## 2014-08-16 ENCOUNTER — Encounter: Payer: Self-pay | Admitting: Family Medicine

## 2014-09-09 ENCOUNTER — Emergency Department (HOSPITAL_COMMUNITY): Payer: Medicare Other

## 2014-09-09 ENCOUNTER — Encounter (HOSPITAL_COMMUNITY): Payer: Self-pay | Admitting: Emergency Medicine

## 2014-09-09 ENCOUNTER — Emergency Department (HOSPITAL_COMMUNITY)
Admission: EM | Admit: 2014-09-09 | Discharge: 2014-09-09 | Disposition: A | Discharge status: Home / Self Care | Payer: Medicare Other | Attending: Emergency Medicine | Admitting: Emergency Medicine

## 2014-09-09 DIAGNOSIS — I252 Old myocardial infarction: Secondary | ICD-10-CM | POA: Diagnosis not present

## 2014-09-09 DIAGNOSIS — F17200 Nicotine dependence, unspecified, uncomplicated: Secondary | ICD-10-CM | POA: Diagnosis not present

## 2014-09-09 DIAGNOSIS — R61 Generalized hyperhidrosis: Secondary | ICD-10-CM | POA: Diagnosis not present

## 2014-09-09 DIAGNOSIS — F411 Generalized anxiety disorder: Secondary | ICD-10-CM | POA: Insufficient documentation

## 2014-09-09 DIAGNOSIS — F329 Major depressive disorder, single episode, unspecified: Secondary | ICD-10-CM | POA: Diagnosis not present

## 2014-09-09 DIAGNOSIS — R109 Unspecified abdominal pain: Secondary | ICD-10-CM | POA: Diagnosis not present

## 2014-09-09 DIAGNOSIS — Z8701 Personal history of pneumonia (recurrent): Secondary | ICD-10-CM | POA: Diagnosis not present

## 2014-09-09 DIAGNOSIS — N183 Chronic kidney disease, stage 3 unspecified: Secondary | ICD-10-CM | POA: Insufficient documentation

## 2014-09-09 DIAGNOSIS — Z79899 Other long term (current) drug therapy: Secondary | ICD-10-CM | POA: Insufficient documentation

## 2014-09-09 DIAGNOSIS — E1149 Type 2 diabetes mellitus with other diabetic neurological complication: Secondary | ICD-10-CM | POA: Insufficient documentation

## 2014-09-09 DIAGNOSIS — R1115 Cyclical vomiting syndrome unrelated to migraine: Secondary | ICD-10-CM | POA: Insufficient documentation

## 2014-09-09 DIAGNOSIS — F3289 Other specified depressive episodes: Secondary | ICD-10-CM | POA: Diagnosis not present

## 2014-09-09 DIAGNOSIS — I10 Essential (primary) hypertension: Secondary | ICD-10-CM | POA: Diagnosis not present

## 2014-09-09 DIAGNOSIS — K219 Gastro-esophageal reflux disease without esophagitis: Secondary | ICD-10-CM | POA: Diagnosis not present

## 2014-09-09 DIAGNOSIS — Z794 Long term (current) use of insulin: Secondary | ICD-10-CM | POA: Insufficient documentation

## 2014-09-09 DIAGNOSIS — K3184 Gastroparesis: Secondary | ICD-10-CM | POA: Insufficient documentation

## 2014-09-09 DIAGNOSIS — R011 Cardiac murmur, unspecified: Secondary | ICD-10-CM | POA: Insufficient documentation

## 2014-09-09 DIAGNOSIS — R112 Nausea with vomiting, unspecified: Secondary | ICD-10-CM | POA: Diagnosis not present

## 2014-09-09 DIAGNOSIS — I129 Hypertensive chronic kidney disease with stage 1 through stage 4 chronic kidney disease, or unspecified chronic kidney disease: Secondary | ICD-10-CM | POA: Diagnosis not present

## 2014-09-09 DIAGNOSIS — K2970 Gastritis, unspecified, without bleeding: Secondary | ICD-10-CM | POA: Diagnosis not present

## 2014-09-09 DIAGNOSIS — K2990 Gastroduodenitis, unspecified, without bleeding: Secondary | ICD-10-CM | POA: Diagnosis not present

## 2014-09-09 DIAGNOSIS — Z862 Personal history of diseases of the blood and blood-forming organs and certain disorders involving the immune mechanism: Secondary | ICD-10-CM | POA: Insufficient documentation

## 2014-09-09 LAB — COMPREHENSIVE METABOLIC PANEL
ALK PHOS: 66 U/L (ref 39–117)
ALT: 7 U/L (ref 0–35)
AST: 10 U/L (ref 0–37)
Albumin: 3.4 g/dL — ABNORMAL LOW (ref 3.5–5.2)
Anion gap: 11 (ref 5–15)
BUN: 29 mg/dL — ABNORMAL HIGH (ref 6–23)
CHLORIDE: 113 meq/L — AB (ref 96–112)
CO2: 18 meq/L — AB (ref 19–32)
Calcium: 8.2 mg/dL — ABNORMAL LOW (ref 8.4–10.5)
Creatinine, Ser: 1.9 mg/dL — ABNORMAL HIGH (ref 0.50–1.10)
GFR calc Af Amer: 35 mL/min — ABNORMAL LOW (ref >90)
GFR calc non Af Amer: 30 mL/min — ABNORMAL LOW (ref >90)
Glucose, Bld: 147 mg/dL — ABNORMAL HIGH (ref 70–99)
Potassium: 3.7 mEq/L (ref 3.7–5.3)
SODIUM: 142 meq/L (ref 137–147)
TOTAL PROTEIN: 6.3 g/dL (ref 6.0–8.3)
Total Bilirubin: <0.2 mg/dL — ABNORMAL LOW (ref 0.3–1.2)

## 2014-09-09 LAB — CBC WITH DIFFERENTIAL/PLATELET
Basophils Absolute: 0 10*3/uL (ref 0.0–0.1)
Basophils Relative: 0 % (ref 0–1)
EOS ABS: 0.1 10*3/uL (ref 0.0–0.7)
Eosinophils Relative: 2 % (ref 0–5)
HCT: 29.8 % — ABNORMAL LOW (ref 36.0–46.0)
Hemoglobin: 10.5 g/dL — ABNORMAL LOW (ref 12.0–15.0)
LYMPHS ABS: 1.6 10*3/uL (ref 0.7–4.0)
LYMPHS PCT: 35 % (ref 12–46)
MCH: 32.2 pg (ref 26.0–34.0)
MCHC: 35.2 g/dL (ref 30.0–36.0)
MCV: 91.4 fL (ref 78.0–100.0)
Monocytes Absolute: 0.4 10*3/uL (ref 0.1–1.0)
Monocytes Relative: 9 % (ref 3–12)
NEUTROS ABS: 2.5 10*3/uL (ref 1.7–7.7)
NEUTROS PCT: 54 % (ref 43–77)
PLATELETS: 168 10*3/uL (ref 150–400)
RBC: 3.26 MIL/uL — AB (ref 3.87–5.11)
RDW: 13 % (ref 11.5–15.5)
WBC: 4.5 10*3/uL (ref 4.0–10.5)

## 2014-09-09 LAB — LIPASE, BLOOD: Lipase: 77 U/L — ABNORMAL HIGH (ref 11–59)

## 2014-09-09 MED ORDER — HYDROMORPHONE HCL PF 1 MG/ML IJ SOLN
1.0000 mg | Freq: Once | INTRAMUSCULAR | Status: AC
Start: 2014-09-09 — End: 2014-09-09
  Administered 2014-09-09: 1 mg via INTRAVENOUS
  Filled 2014-09-09: qty 1

## 2014-09-09 MED ORDER — PROMETHAZINE HCL 25 MG/ML IJ SOLN
25.0000 mg | Freq: Once | INTRAMUSCULAR | Status: AC
  Administered 2014-09-09: 25 mg via INTRAVENOUS
  Filled 2014-09-09: qty 1

## 2014-09-09 MED ORDER — LORAZEPAM 2 MG/ML IJ SOLN
1.0000 mg | Freq: Once | INTRAMUSCULAR | Status: AC
  Administered 2014-09-09: 1 mg via INTRAVENOUS
  Filled 2014-09-09: qty 1

## 2014-09-09 MED ORDER — STERILE WATER FOR INJECTION IJ SOLN
INTRAMUSCULAR | Status: AC
  Administered 2014-09-09: 14:00:00
  Filled 2014-09-09: qty 10

## 2014-09-09 MED ORDER — SODIUM CHLORIDE 0.9 % IV BOLUS (SEPSIS)
2000.0000 mL | Freq: Once | INTRAVENOUS | Status: AC
  Administered 2014-09-09: 2000 mL via INTRAVENOUS

## 2014-09-09 MED ORDER — HEPARIN SOD (PORK) LOCK FLUSH 100 UNIT/ML IV SOLN
500.0000 [IU] | Freq: Once | INTRAVENOUS | Status: AC
  Administered 2014-09-09: 500 [IU]
  Filled 2014-09-09: qty 5

## 2014-09-09 NOTE — ED Provider Notes (Signed)
CSN: [id number]     Arrival date & time 09/09/14  1303 History   First MD Initiated Contact with Patient 09/09/14 1325     Chief Complaint  Patient presents with  . Abdominal Pain     (Consider location/radiation/quality/duration/timing/severity/associated sxs/prior Treatment) HPI 48 year old female presents with recurrent abdominal pain and vomiting consistent with her cyclic vomiting syndrome. She states that it started around 2 AM with pain and then developed vomiting around 6 AM. She took a Phenergan suppository without any change in her vomiting. Denies fevers or chills. The pain is epigastric. She's had a history of small bowel obstruction in the past but states this feels like her cyclic vomiting and not like an obstruction. Had a bowel movement a couple days ago. Currently rates the pain as severe.  Past Medical History  Diagnosis Date  . PANCREATITIS 05/09/2008  . Cyclic vomiting syndrome   . Hypertension   . Complication of anesthesia     "problems waking up"  . Heart murmur   . Myocardial infarction 07/2007    "they say I've had a silent one; I don't know"  . Pneumonia   . Anemia   . Blood transfusion   . GERD (gastroesophageal reflux disease)   . Anxiety   . Depression   . Smoker   . Marijuana smoker, continuous   . NEUROPATHY, DIABETIC 02/23/2007  . Type II diabetes mellitus   . Diabetic gastroparesis     /e-chart  . Chronic kidney disease (CKD), stage III (moderate)    Past Surgical History  Procedure Laterality Date  . Port-a-cath placement  ~ 2008    right chest; "poor access; frequent hospitalizations"  . Laparotomy and lysis of adhesions    . Abdominal hysterectomy  02/2002  . Cholecystectomy  1980's  . Cardiac catheterization    . Dilation and evacuation  X3  . Left hand surgery    . Cataract extraction w/ intraocular lens  implant, bilateral    . Colonoscopy    . Upper gi endoscopy     Family History  Problem Relation Age of Onset  . Diabetes type  II Mother   . Diabetes Mother   . Hypertension Mother   . Diabetes type II Sister   . Diabetes Sister   . Hypertension Sister   . Hypertension Brother    History  Substance Use Topics  . Smoking status: Current Some Day Smoker -- 0.50 packs/day for 22 years    Types: Cigarettes  . Smokeless tobacco: Never Used  . Alcohol Use: No   OB History   Grav Para Term Preterm Abortions TAB SAB Ect Mult Living                 Review of Systems  Constitutional: Positive for diaphoresis (when vomiting). Negative for fever.  Gastrointestinal: Positive for nausea, vomiting and abdominal pain. Negative for diarrhea and abdominal distention.  Musculoskeletal: Negative for back pain.  All other systems reviewed and are negative.     Allergies  Bee venom; Acetaminophen; Novolog; and Erythromycin  Home Medications   Prior to Admission medications   Medication Sig Start Date End Date Taking? Authorizing Provider  carvedilol (COREG) 25 MG tablet Take 1 tablet (25 mg total) by mouth 2 (two) times daily with a meal. 07/18/14   Hilton Sinclair, MD  cyclobenzaprine (FLEXERIL) 5 MG tablet Take 1 tablet (5 mg total) by mouth 3 (three) times daily as needed. For spasms 04/16/14   Hilton Sinclair, MD  diclofenac sodium (VOLTAREN) 1 % GEL Apply 2 g topically 4 (four) times daily as needed (for pain). 01/30/14   Hilton Sinclair, MD  hydrochlorothiazide (HYDRODIURIL) 25 MG tablet Take 1 tablet (25 mg total) by mouth daily. 05/15/14   Bernadene Bell, MD  insulin glargine (LANTUS) 100 UNIT/ML injection Inject 0.2 mLs (20 Units total) into the skin every morning. 07/18/14   Hilton Sinclair, MD  lisinopril (PRINIVIL,ZESTRIL) 40 MG tablet Take 1 tablet (40 mg total) by mouth daily. 08/15/14   Hilton Sinclair, MD  LORazepam (ATIVAN) 0.5 MG tablet Take 1.5 tablets (0.75 mg total) by mouth 2 (two) times daily. Fill on/after 09/18/14. 08/14/14   Hilton Sinclair, MD  oxyCODONE (OXY  IR/ROXICODONE) 5 MG immediate release tablet Take 1 tablet (5 mg total) by mouth daily as needed for severe pain. Fill on/after 09/18/14. 08/14/14   Hilton Sinclair, MD  promethazine (PHENERGAN) 25 MG suppository Place 25 mg rectally every 8 (eight) hours as needed for nausea or vomiting.    Historical Provider, MD  promethazine (PHENERGAN) 25 MG tablet Take 1 tablet (25 mg total) by mouth every 8 (eight) hours as needed for nausea. 08/14/14   Hilton Sinclair, MD  SUMAtriptan (IMITREX) 50 MG tablet Take 50 mg by mouth every 2 (two) hours as needed for migraine. May repeat in 2 hours if headache persists or recurs.    Historical Provider, MD   BP 147/104  Pulse 82  Temp(Src) 98.1 F (36.7 C) (Oral)  Resp 16  SpO2 100% Physical Exam  Nursing note and vitals reviewed. Constitutional: She is oriented to person, place, and time. She appears well-developed and well-nourished.  HENT:  Head: Normocephalic and atraumatic.  Right Ear: External ear normal.  Left Ear: External ear normal.  Nose: Nose normal.  Eyes: Right eye exhibits no discharge. Left eye exhibits no discharge.  Cardiovascular: Normal rate, regular rhythm and normal heart sounds.   Pulmonary/Chest: Effort normal and breath sounds normal.  Abdominal: Soft. She exhibits no distension. There is tenderness in the epigastric area.  Soft abdomen with epigastric pain  Neurological: She is alert and oriented to person, place, and time.  Skin: Skin is warm and dry.    ED Course  Procedures (including critical care time) Labs Review Labs Reviewed  CBC WITH DIFFERENTIAL - Abnormal; Notable for the following:    RBC 3.26 (*)    Hemoglobin 10.5 (*)    HCT 29.8 (*)    All other components within normal limits  COMPREHENSIVE METABOLIC PANEL - Abnormal; Notable for the following:    Chloride 113 (*)    CO2 18 (*)    Glucose, Bld 147 (*)    BUN 29 (*)    Creatinine, Ser 1.90 (*)    Calcium 8.2 (*)    Albumin 3.4 (*)    Total  Bilirubin <0.2 (*)    GFR calc non Af Amer 30 (*)    GFR calc Af Amer 35 (*)    All other components within normal limits  LIPASE, BLOOD - Abnormal; Notable for the following:    Lipase 77 (*)    All other components within normal limits    Imaging Review Dg Abd Acute W/chest  09/09/2014   CLINICAL DATA:  Abdominal pain.  EXAM: ACUTE ABDOMEN SERIES (ABDOMEN 2 VIEW & CHEST 1 VIEW)  COMPARISON:  May 13, 2014.  FINDINGS: There is no evidence of dilated bowel loops or free intraperitoneal air. Large amount of  stool is noted in the transverse colon. Status post cholecystectomy. Phleboliths are noted in the pelvis. Heart size and mediastinal contours are within normal limits. Both lungs are clear.  IMPRESSION: Large amount of stool seen in the transverse colon concerning for constipation.   Electronically Signed   By: Sabino Dick M.D.   On: 09/09/2014 15:11     EKG Interpretation None      MDM   Final diagnoses:  Non-intractable cyclical vomiting with nausea    Patient feels significantly improved after pain and nausea medicine in the ED. Her abdomen is soft there is focal tenderness her epigastrium which is consistent with multiple prior exams. She feels is not related to her prior history of obstructions but consistent with her chronic cyclic vomiting syndrome. Her x-ray shows no evidence of obstruction or air-fluid levels. Labwork shows Bicarb of 18 but normal anion gap. Given fluids as this is likely from dehydration. Given her improving symptoms and desire for discharge will discharge her home with return precautions. At the time of reevaluation the patient was sleeping and appears to not be in pain when awoken. No vomiting while in the ED.    Ephraim Hamburger, MD 09/09/14 [phone]

## 2014-09-09 NOTE — ED Notes (Signed)
Per EMS- generalized abdominal pain since 0200 this morning. Hx GI problems (blockages). C/o abdominal pain above umbilicus. Patient is dry heaving at this time. Denies diarrhea. Took Phenergan suppository this morning. VS: BP 134/90 HR 80 RR 18 CBG 225. Hx DM, HTN. Took insulin this morning.

## 2014-09-09 NOTE — ED Notes (Signed)
Bed: [facility] Expected date:  Expected time:  Means of arrival:  Comments: EMS-chronic abdominal pain

## 2014-09-09 NOTE — ED Notes (Signed)
Patient transported to X-ray 

## 2014-09-09 NOTE — Discharge Instructions (Signed)
Cyclic Vomiting Syndrome °Cyclic vomiting syndrome is a benign condition in which patients experience bouts or cycles of severe nausea and vomiting that last for hours or even days. The bouts of nausea and vomiting alternate with longer periods of no symptoms and generally good health. Cyclic vomiting syndrome occurs mostly in children, but can affect adults. °CAUSES  °CVS has no known cause. Each episode is typically similar to the previous ones. The episodes tend to:  °· Start at about the same time of day. °· Last the same length of time. °· Present the same symptoms at the same level of intensity. °Cyclic vomiting syndrome can begin at any age in children and adults. Cyclic vomiting syndrome usually starts between the ages of 3 and 7 years. In adults, episodes tend to occur less often than they do in children, but they last longer. Furthermore, the events or situations that trigger episodes in adults cannot always be pinpointed as easily as they can in children. °There are 4 phases of cyclic vomiting syndrome: °1. Prodrome. The prodrome phase signals that an episode of nausea and vomiting is about to begin. This phase can last from just a few minutes to several hours. This phase is often marked by belly (abdominal) pain. Sometimes taking medicine early in the prodrome phase can stop an episode in progress. However, sometimes there is no warning. A person may simply wake up in the middle of the night or early morning and begin vomiting. °2. Episode. The episode phase consists of: °· Severe vomiting. °· Nausea. °· Gagging (retching). °3. Recovery. The recovery phase begins when the nausea and vomiting stop. Healthy color, appetite, and energy return. °4. Symptom-free interval. The symptom-free interval phase is the period between episodes when no symptoms are present. °TRIGGERS °Episodes can be triggered by an infection or event. Examples of triggers include: °· Infections. °· Colds, allergies, sinus problems, and  the flu. °· Eating certain foods such as chocolate or cheese. °· Foods with monosodium glutamate (MSG) or preservatives. °· Fast foods. °· Pre-packaged foods. °· Foods with low nutritional value (junk foods). °· Overeating. °· Eating just before going to bed. °· Hot weather. °· Dehydration. °· Not enough sleep or poor sleep quality. °· Physical exhaustion. °· Menstruation. °· Motion sickness. °· Emotional stress (school or home difficulties). °· Excitement or stress. °SYMPTOMS  °The main symptoms of cyclic vomiting syndrome are: °· Severe vomiting. °· Nausea. °· Gagging (retching). °Episodes usually begin at night or the first thing in the morning. Episodes may include vomiting or retching up to 5 or 6 times an hour during the worst of the episode. Episodes usually last anywhere from 1 to 4 days. Episodes can last for up to 10 days. Other symptoms include: °· Paleness. °· Exhaustion. °· Listlessness. °· Abdominal pain. °· Loose stools or diarrhea. °Sometimes the nausea and vomiting are so severe that a person appears to be almost unconscious. Sensitivity to light, headache, fever, dizziness, may also accompany an episode. In addition, the vomiting may cause drooling and excessive thirst. Drinking water usually leads to more vomiting, though the water can dilute the acid in the vomit, making the episode a little less painful. Continuous vomiting can lead to dehydration, which means that the body has lost excessive water and salts. °DIAGNOSIS  °Cyclic vomiting syndrome is hard to diagnose because there are no clear tests to identify it. A caregiver must diagnose cyclic vomiting syndrome by looking at symptoms and medical history. A caregiver must exclude more common diseases   or disorders that can also cause nausea and vomiting. Also, diagnosis takes time because caregivers need to identify a pattern or cycle to the vomiting. °TREATMENT  °Cyclic vomiting syndrome cannot be cured. Treatment varies, but people with  cyclic vomiting syndrome should get plenty of rest and sleep and take medications that prevent, stop, or lessen the vomiting episodes and other symptoms. °People whose episodes are frequent and long-lasting may be treated during the symptom-free intervals in an effort to prevent or ease future episodes. The symptom-free phase is a good time to eliminate anything known to trigger an episode. For example, if episodes are brought on by stress or excitement, this period is the time to find ways to reduce stress and stay calm. If sinus problems or allergies cause episodes, those conditions should be treated. The triggers listed above should be avoided or prevented. °Because of the similarities between migraine and cyclic vomiting syndrome, caregivers treat some people with severe cyclic vomiting syndrome with drugs that are also used for migraine headaches. The drugs are designed to: °· Prevent episodes. °· Reduce their frequency. °· Lessen their severity. °HOME CARE INSTRUCTIONS °Once a vomiting episode begins, treatment is supportive. It helps to stay in bed and sleep in a dark, quiet room. Severe nausea and vomiting may require hospitalization and intravenous (IV) fluids to prevent dehydration. Relaxing medications (sedatives) may help if the nausea continues. Sometimes, during the prodrome phase, it is possible to stop an episode from happening altogether. Only take over-the-counter or prescription medicines for pain, discomfort or fever as directed by your caregiver. Do not give aspirin to children. °During the recovery phase, drinking water and replacing lost electrolytes (salts in the blood) are very important. Electrolytes are salts that the body needs to function well and stay healthy. Symptoms during the recovery phase can vary. Some people find that their appetites return to normal immediately, while others need to begin by drinking clear liquids and then move slowly to solid food. °RELATED COMPLICATIONS °The  severe vomiting that defines cyclic vomiting syndrome is a risk factor for several complications: °· Dehydration--Vomiting causes the body to lose water quickly. °· Electrolyte imbalance--Vomiting also causes the body to lose the important salts it needs to keep working properly. °· Peptic esophagitis--The tube that connects the mouth to the stomach (esophagus) becomes injured from the stomach acid that comes up with the vomit. °· Hematemesis--The esophagus becomes irritated and bleeds, so blood mixes with the vomit. °· Mallory-Weiss tear--The lower end of the esophagus may tear open or the stomach may bruise from vomiting or retching. °· Tooth decay--The acid in the vomit can hurt the teeth by corroding the tooth enamel. °SEEK MEDICAL CARE IF: °You have questions or problems. °Document Released: 02/21/2002 Document Revised: 03/06/2012 Document Reviewed: 03/22/2011 °[employer]® Patient Information ©2015 [employer]. This information is not intended to replace advice given to you by your health care provider. Make sure you discuss any questions you have with your health care provider. ° °

## 2014-09-11 ENCOUNTER — Encounter (HOSPITAL_COMMUNITY): Payer: Self-pay | Admitting: Emergency Medicine

## 2014-09-11 ENCOUNTER — Emergency Department (HOSPITAL_COMMUNITY): Payer: Medicare Other

## 2014-09-11 ENCOUNTER — Emergency Department (HOSPITAL_COMMUNITY)
Admission: EM | Admit: 2014-09-11 | Discharge: 2014-09-11 | Disposition: A | Discharge status: Home / Self Care | Payer: Medicare Other | Attending: Emergency Medicine | Admitting: Emergency Medicine

## 2014-09-11 DIAGNOSIS — Z862 Personal history of diseases of the blood and blood-forming organs and certain disorders involving the immune mechanism: Secondary | ICD-10-CM | POA: Insufficient documentation

## 2014-09-11 DIAGNOSIS — Z79899 Other long term (current) drug therapy: Secondary | ICD-10-CM | POA: Insufficient documentation

## 2014-09-11 DIAGNOSIS — R638 Other symptoms and signs concerning food and fluid intake: Secondary | ICD-10-CM | POA: Diagnosis not present

## 2014-09-11 DIAGNOSIS — F1210 Cannabis abuse, uncomplicated: Secondary | ICD-10-CM | POA: Insufficient documentation

## 2014-09-11 DIAGNOSIS — R109 Unspecified abdominal pain: Secondary | ICD-10-CM | POA: Insufficient documentation

## 2014-09-11 DIAGNOSIS — F411 Generalized anxiety disorder: Secondary | ICD-10-CM | POA: Insufficient documentation

## 2014-09-11 DIAGNOSIS — I129 Hypertensive chronic kidney disease with stage 1 through stage 4 chronic kidney disease, or unspecified chronic kidney disease: Secondary | ICD-10-CM | POA: Insufficient documentation

## 2014-09-11 DIAGNOSIS — R1110 Vomiting, unspecified: Secondary | ICD-10-CM | POA: Diagnosis present

## 2014-09-11 DIAGNOSIS — N183 Chronic kidney disease, stage 3 unspecified: Secondary | ICD-10-CM | POA: Diagnosis not present

## 2014-09-11 DIAGNOSIS — R011 Cardiac murmur, unspecified: Secondary | ICD-10-CM | POA: Insufficient documentation

## 2014-09-11 DIAGNOSIS — E1142 Type 2 diabetes mellitus with diabetic polyneuropathy: Secondary | ICD-10-CM | POA: Diagnosis not present

## 2014-09-11 DIAGNOSIS — F17200 Nicotine dependence, unspecified, uncomplicated: Secondary | ICD-10-CM | POA: Diagnosis not present

## 2014-09-11 DIAGNOSIS — E1149 Type 2 diabetes mellitus with other diabetic neurological complication: Secondary | ICD-10-CM | POA: Insufficient documentation

## 2014-09-11 DIAGNOSIS — R112 Nausea with vomiting, unspecified: Secondary | ICD-10-CM | POA: Diagnosis not present

## 2014-09-11 DIAGNOSIS — R0602 Shortness of breath: Secondary | ICD-10-CM | POA: Diagnosis not present

## 2014-09-11 DIAGNOSIS — M549 Dorsalgia, unspecified: Secondary | ICD-10-CM | POA: Insufficient documentation

## 2014-09-11 DIAGNOSIS — Z8701 Personal history of pneumonia (recurrent): Secondary | ICD-10-CM | POA: Insufficient documentation

## 2014-09-11 DIAGNOSIS — Z9889 Other specified postprocedural states: Secondary | ICD-10-CM | POA: Diagnosis not present

## 2014-09-11 DIAGNOSIS — R1115 Cyclical vomiting syndrome unrelated to migraine: Secondary | ICD-10-CM | POA: Insufficient documentation

## 2014-09-11 DIAGNOSIS — I252 Old myocardial infarction: Secondary | ICD-10-CM | POA: Insufficient documentation

## 2014-09-11 DIAGNOSIS — R110 Nausea: Secondary | ICD-10-CM | POA: Insufficient documentation

## 2014-09-11 DIAGNOSIS — Z794 Long term (current) use of insulin: Secondary | ICD-10-CM | POA: Diagnosis not present

## 2014-09-11 LAB — CBC WITH DIFFERENTIAL/PLATELET
Basophils Absolute: 0 10*3/uL (ref 0.0–0.1)
Basophils Relative: 0 % (ref 0–1)
Eosinophils Absolute: 0.2 10*3/uL (ref 0.0–0.7)
Eosinophils Relative: 2 % (ref 0–5)
HCT: 34.2 % — ABNORMAL LOW (ref 36.0–46.0)
Hemoglobin: 11.9 g/dL — ABNORMAL LOW (ref 12.0–15.0)
Lymphocytes Relative: 42 % (ref 12–46)
Lymphs Abs: 2.9 10*3/uL (ref 0.7–4.0)
MCH: 31.9 pg (ref 26.0–34.0)
MCHC: 34.8 g/dL (ref 30.0–36.0)
MCV: 91.7 fL (ref 78.0–100.0)
Monocytes Absolute: 0.7 10*3/uL (ref 0.1–1.0)
Monocytes Relative: 9 % (ref 3–12)
Neutro Abs: 3.3 10*3/uL (ref 1.7–7.7)
Neutrophils Relative %: 47 % (ref 43–77)
Platelets: 193 10*3/uL (ref 150–400)
RBC: 3.73 MIL/uL — ABNORMAL LOW (ref 3.87–5.11)
RDW: 13.2 % (ref 11.5–15.5)
WBC: 7.1 10*3/uL (ref 4.0–10.5)

## 2014-09-11 LAB — COMPREHENSIVE METABOLIC PANEL
ALT: 7 U/L (ref 0–35)
AST: 12 U/L (ref 0–37)
Albumin: 3.8 g/dL (ref 3.5–5.2)
Alkaline Phosphatase: 75 U/L (ref 39–117)
Anion gap: 14 (ref 5–15)
BUN: 30 mg/dL — ABNORMAL HIGH (ref 6–23)
CO2: 19 mEq/L (ref 19–32)
Calcium: 9.4 mg/dL (ref 8.4–10.5)
Chloride: 106 mEq/L (ref 96–112)
Creatinine, Ser: 2.14 mg/dL — ABNORMAL HIGH (ref 0.50–1.10)
GFR calc Af Amer: 30 mL/min — ABNORMAL LOW (ref >90)
GFR calc non Af Amer: 26 mL/min — ABNORMAL LOW (ref >90)
Glucose, Bld: 134 mg/dL — ABNORMAL HIGH (ref 70–99)
Potassium: 3.9 mEq/L (ref 3.7–5.3)
Sodium: 139 mEq/L (ref 137–147)
Total Bilirubin: 0.3 mg/dL (ref 0.3–1.2)
Total Protein: 7 g/dL (ref 6.0–8.3)

## 2014-09-11 LAB — URINALYSIS, ROUTINE W REFLEX MICROSCOPIC
Bilirubin Urine: NEGATIVE
Glucose, UA: NEGATIVE mg/dL
Ketones, ur: NEGATIVE mg/dL
Leukocytes, UA: NEGATIVE
Nitrite: NEGATIVE
Protein, ur: NEGATIVE mg/dL
Specific Gravity, Urine: 1.015 (ref 1.005–1.030)
Urobilinogen, UA: 0.2 mg/dL (ref 0.0–1.0)
pH: 5.5 (ref 5.0–8.0)

## 2014-09-11 LAB — RAPID URINE DRUG SCREEN, HOSP PERFORMED
Amphetamines: NOT DETECTED
Barbiturates: NOT DETECTED
Benzodiazepines: NOT DETECTED
Cocaine: NOT DETECTED
Opiates: NOT DETECTED
Tetrahydrocannabinol: POSITIVE — AB

## 2014-09-11 LAB — LIPASE, BLOOD: Lipase: 83 U/L — ABNORMAL HIGH (ref 11–59)

## 2014-09-11 LAB — URINE MICROSCOPIC-ADD ON

## 2014-09-11 MED ORDER — LORAZEPAM 0.5 MG PO TABS: 1.5000 mg | ORAL_TABLET | Freq: Two times a day (BID) | ORAL | Status: DC

## 2014-09-11 MED ORDER — ONDANSETRON HCL 4 MG/2ML IJ SOLN: 4.0000 mg | Freq: Once | INTRAMUSCULAR | Status: DC

## 2014-09-11 MED ORDER — HYDROMORPHONE HCL PF 1 MG/ML IJ SOLN
1.0000 mg | Freq: Once | INTRAMUSCULAR | Status: AC
  Administered 2014-09-11: 1 mg via INTRAVENOUS
  Filled 2014-09-11: qty 1

## 2014-09-11 MED ORDER — LORAZEPAM 2 MG/ML IJ SOLN
1.0000 mg | Freq: Once | INTRAMUSCULAR | Status: AC
  Administered 2014-09-11: 1 mg via INTRAMUSCULAR
  Filled 2014-09-11: qty 1

## 2014-09-11 MED ORDER — LIDOCAINE-PRILOCAINE 2.5-2.5 % EX CREA
TOPICAL_CREAM | Freq: Once | CUTANEOUS | Status: AC
  Administered 2014-09-11: 1 via TOPICAL
  Filled 2014-09-11: qty 5

## 2014-09-11 MED ORDER — GI COCKTAIL ~~LOC~~
30.0000 mL | Freq: Once | ORAL | Status: AC
  Administered 2014-09-11: 30 mL via ORAL
  Filled 2014-09-11: qty 30

## 2014-09-11 MED ORDER — MORPHINE SULFATE 4 MG/ML IJ SOLN
4.0000 mg | Freq: Once | INTRAMUSCULAR | Status: DC
  Filled 2014-09-11: qty 1

## 2014-09-11 MED ORDER — LORAZEPAM 2 MG/ML IJ SOLN: 1.0000 mg | Freq: Once | INTRAMUSCULAR | Status: DC

## 2014-09-11 MED ORDER — MORPHINE SULFATE 4 MG/ML IJ SOLN: 4.0000 mg | Freq: Once | INTRAMUSCULAR | Status: DC

## 2014-09-11 MED ORDER — PROMETHAZINE HCL 25 MG/ML IJ SOLN
25.0000 mg | Freq: Once | INTRAMUSCULAR | Status: AC
  Administered 2014-09-11: 25 mg via INTRAVENOUS
  Filled 2014-09-11: qty 1

## 2014-09-11 MED ORDER — PROMETHAZINE HCL 25 MG RE SUPP: 25.0000 mg | Freq: Four times a day (QID) | RECTAL | Status: DC | PRN

## 2014-09-11 MED ORDER — SUCRALFATE 1 G PO TABS
1.0000 g | ORAL_TABLET | Freq: Once | ORAL | Status: AC
  Administered 2014-09-11: 1 g via ORAL
  Filled 2014-09-11: qty 1

## 2014-09-11 MED ORDER — SODIUM CHLORIDE 0.9 % IV BOLUS (SEPSIS)
1000.0000 mL | Freq: Once | INTRAVENOUS | Status: AC
  Administered 2014-09-11: 1000 mL via INTRAVENOUS

## 2014-09-11 MED ORDER — SODIUM CHLORIDE 0.9 % IV BOLUS (SEPSIS): 1000.0000 mL | Freq: Once | INTRAVENOUS | Status: DC

## 2014-09-11 NOTE — ED Notes (Signed)
IV team paged.  

## 2014-09-11 NOTE — ED Notes (Signed)
Pt reports having n/v, was seen at [facility] on Monday for same. No relief with phenergan suppository this am.

## 2014-09-11 NOTE — ED Notes (Signed)
Patient ambulated to the bathroom.

## 2014-09-11 NOTE — ED Notes (Signed)
Patient transported to X-ray 

## 2014-09-11 NOTE — ED Provider Notes (Signed)
CSN: [id number]     Arrival date & time 09/11/14  0736 History   First MD Initiated Contact with Patient 09/11/14 [phone]     Chief Complaint  Patient presents with  . Emesis     (Consider location/radiation/quality/duration/timing/severity/associated sxs/prior Treatment) HPI Comments: Patient complains of vomiting x2 starting this morning at 5 AM.  She reports similar episodes on Monday and she was treated with at [facility] ED. She reports that she always gets like this when she tries to quit smoking marijuana; last smoked THC on Saturday.  She took a phenergan suppository this morning at 6 AM without any relief.  Additionally, he reports small bowel obstruction 12 years ago after hysterectomy; her last bowel movement was one week ago.  She reports compliance with her diabetes medications, but recent blood glucoses in 200s.  History of pancreatitis, but denies that her epigastric abdominal pain radiates to her back.     Patient is a 48 y.o. female presenting with vomiting and abdominal pain.  Emesis Severity:  Severe Duration:  3 hours Timing:  Constant Quality:  Stomach contents How soon after eating does vomiting occur:  3 hours Chronicity:  Recurrent Recent urination:  Normal Relieved by:  Nothing Exacerbated by: coffee. Ineffective treatments: phenergan suppository  Associated symptoms: abdominal pain   Associated symptoms: no chills, no diarrhea, no fever and no URI   Risk factors: diabetes   Risk factors: no alcohol use and no suspect food intake   Abdominal Pain Pain location:  Epigastric Pain quality: heavy   Pain radiates to:  Does not radiate Onset quality:  Sudden Duration:  2 hours Associated symptoms: nausea and vomiting   Associated symptoms: no chest pain, no chills, no cough, no diarrhea, no dysuria, no fever, no hematemesis and no shortness of breath   Risk factors: recent hospitalization     Past Medical History  Diagnosis Date  . PANCREATITIS 05/09/2008  . Cyclic  vomiting syndrome   . Hypertension   . Complication of anesthesia     "problems waking up"  . Heart murmur   . Myocardial infarction 07/2007    "they say I've had a silent one; I don't know"  . Pneumonia   . Anemia   . Blood transfusion   . GERD (gastroesophageal reflux disease)   . Anxiety   . Depression   . Smoker   . Marijuana smoker, continuous   . NEUROPATHY, DIABETIC 02/23/2007  . Type II diabetes mellitus   . Diabetic gastroparesis     /e-chart  . Chronic kidney disease (CKD), stage III (moderate)    Past Surgical History  Procedure Laterality Date  . Port-a-cath placement  ~ 2008    right chest; "poor access; frequent hospitalizations"  . Laparotomy and lysis of adhesions    . Abdominal hysterectomy  02/2002  . Cholecystectomy  1980's  . Cardiac catheterization    . Dilation and evacuation  X3  . Left hand surgery    . Cataract extraction w/ intraocular lens  implant, bilateral    . Colonoscopy    . Upper gi endoscopy     Family History  Problem Relation Age of Onset  . Diabetes type II Mother   . Diabetes Mother   . Hypertension Mother   . Diabetes type II Sister   . Diabetes Sister   . Hypertension Sister   . Hypertension Brother    History  Substance Use Topics  . Smoking status: Current Some Day Smoker -- 0.50 packs/day for  22 years    Types: Cigarettes  . Smokeless tobacco: Never Used  . Alcohol Use: No   OB History   Grav Para Term Preterm Abortions TAB SAB Ect Mult Living                 Review of Systems  Constitutional: Positive for diaphoresis and appetite change. Negative for fever and chills.  HENT: Negative.   Respiratory: Negative for cough, chest tightness and shortness of breath.   Cardiovascular: Negative for chest pain.  Gastrointestinal: Positive for nausea, vomiting and abdominal pain. Negative for diarrhea, blood in stool and hematemesis.  Genitourinary: Negative for dysuria, frequency and flank pain.  Musculoskeletal: Positive  for back pain.      Allergies  Bee venom; Acetaminophen; Novolog; and Erythromycin  Home Medications   Prior to Admission medications   Medication Sig Start Date End Date Taking? Authorizing Provider  carvedilol (COREG) 25 MG tablet Take 25 mg by mouth 2 (two) times daily with a meal.   Yes Historical Provider, MD  hydrochlorothiazide (HYDRODIURIL) 25 MG tablet Take 25 mg by mouth daily.   Yes Historical Provider, MD  insulin glargine (LANTUS) 100 UNIT/ML injection Inject 20 Units into the skin daily.    Yes Historical Provider, MD  lisinopril (PRINIVIL,ZESTRIL) 40 MG tablet Take 40 mg by mouth daily.   Yes Historical Provider, MD  promethazine (PHENERGAN) 25 MG suppository Place 25 mg rectally every 6 (six) hours as needed for nausea or vomiting.   Yes Historical Provider, MD  promethazine (PHENERGAN) 25 MG tablet Take 25 mg by mouth every 6 (six) hours as needed for nausea or vomiting.   Yes Historical Provider, MD  SUMAtriptan (IMITREX) 50 MG tablet Take 50 mg by mouth every 2 (two) hours as needed for migraine. May repeat in 2 hours if headache persists or recurs.   Yes Historical Provider, MD  LORazepam (ATIVAN) 0.5 MG tablet Take 3 tablets (1.5 mg total) by mouth 2 (two) times daily. 09/11/14   Olam Idler, MD  promethazine (PHENERGAN) 25 MG suppository Place 1 suppository (25 mg total) rectally every 6 (six) hours as needed for nausea or vomiting. 09/11/14   Olam Idler, MD   BP 128/69  Pulse 61  Temp(Src) 98.3 F (36.8 C) (Oral)  Resp 16  SpO2 100% Physical Exam  Constitutional: She appears well-developed. No distress.  HENT:  Mouth/Throat: Oropharynx is clear and moist.  Eyes: Pupils are equal, round, and reactive to light.  Cardiovascular: Normal rate, regular rhythm and normal heart sounds.   Pulmonary/Chest: Effort normal and breath sounds normal. No respiratory distress. She has no wheezes. She has no rales.  Abdominal: Soft. There is no rebound and no guarding.   Epigastric tenderness   Neurological: She is alert.  Skin: Skin is warm. No rash noted. She is not diaphoretic.    ED Course  Procedures (including critical care time) Labs Review Labs Reviewed  CBC WITH DIFFERENTIAL - Abnormal; Notable for the following:    RBC 3.73 (*)    Hemoglobin 11.9 (*)    HCT 34.2 (*)    All other components within normal limits  URINALYSIS, ROUTINE W REFLEX MICROSCOPIC - Abnormal; Notable for the following:    Hgb urine dipstick SMALL (*)    All other components within normal limits  URINE RAPID DRUG SCREEN (HOSP PERFORMED) - Abnormal; Notable for the following:    Tetrahydrocannabinol POSITIVE (*)    All other components within normal limits  COMPREHENSIVE METABOLIC  PANEL - Abnormal; Notable for the following:    Glucose, Bld 134 (*)    BUN 30 (*)    Creatinine, Ser 2.14 (*)    GFR calc non Af Amer 26 (*)    GFR calc Af Amer 30 (*)    All other components within normal limits  LIPASE, BLOOD - Abnormal; Notable for the following:    Lipase 83 (*)    All other components within normal limits  URINE MICROSCOPIC-ADD ON    Imaging Review Dg Abd 1 View  09/11/2014   CLINICAL DATA:  abdominal pain; hx of sbo  EXAM: ABDOMEN - 1 VIEW  COMPARISON:  Radiograph 09/09/2014  FINDINGS: There is a persistent large volume stool in the hepatic flexure of the transverse colon as well throughout the transverse colon. There is gas in the rectum. No dilated loops of large or small bowel. Cholecystectomy clips noted.  IMPRESSION: No significant change in in large stool burden in the transverse colon. No evidence of bowel obstruction.   Electronically Signed   By: Suzy Bouchard M.D.   On: 09/11/2014 08:54     EKG Interpretation None      MDM   Final diagnoses:  Non-intractable cyclical vomiting with nausea   Patient presented with nausea and vomiting times one day, which she reports is consistent with her cyclic vomiting syndrome.  Reports a burning epigastric  pain that began after vomiting.  He also has history of uncontrolled diabetes, pancreatitis, and small bowel obstruction.  Terminal x-ray obtained, which was negative for bowel obstruction and lipase was mildly elevated. UDS positive for [facility] which has been discussed with patient multiple times as the likely cause of her cyclic vomiting. Her nausea and vomiting were controlled with phenergan, ativan and IVFs. Abdominal pain improved with GI cocktail and dilaudid. Patient provided with Rx for phenergan suppositories and ativan 0.5 mg # 10 (as she reports being out due to vomiting them up). Scheduled follow-up at [facility] in two days to recheck Creatine which was mildly elevated @ 2.14 from baseline ~ 2.     Olam Idler, MD 09/11/14 1510

## 2014-09-11 NOTE — Discharge Instructions (Signed)
Cyclic Vomiting Syndrome °Cyclic vomiting syndrome is a benign condition in which patients experience bouts or cycles of severe nausea and vomiting that last for hours or even days. The bouts of nausea and vomiting alternate with longer periods of no symptoms and generally good health. Cyclic vomiting syndrome occurs mostly in children, but can affect adults. °CAUSES  °CVS has no known cause. Each episode is typically similar to the previous ones. The episodes tend to:  °· Start at about the same time of day. °· Last the same length of time. °· Present the same symptoms at the same level of intensity. °Cyclic vomiting syndrome can begin at any age in children and adults. Cyclic vomiting syndrome usually starts between the ages of 3 and 7 years. In adults, episodes tend to occur less often than they do in children, but they last longer. Furthermore, the events or situations that trigger episodes in adults cannot always be pinpointed as easily as they can in children. °There are 4 phases of cyclic vomiting syndrome: °1. Prodrome. The prodrome phase signals that an episode of nausea and vomiting is about to begin. This phase can last from just a few minutes to several hours. This phase is often marked by belly (abdominal) pain. Sometimes taking medicine early in the prodrome phase can stop an episode in progress. However, sometimes there is no warning. A person may simply wake up in the middle of the night or early morning and begin vomiting. °2. Episode. The episode phase consists of: °· Severe vomiting. °· Nausea. °· Gagging (retching). °3. Recovery. The recovery phase begins when the nausea and vomiting stop. Healthy color, appetite, and energy return. °4. Symptom-free interval. The symptom-free interval phase is the period between episodes when no symptoms are present. °TRIGGERS °Episodes can be triggered by an infection or event. Examples of triggers include: °· Infections. °· Colds, allergies, sinus problems, and  the flu. °· Eating certain foods such as chocolate or cheese. °· Foods with monosodium glutamate (MSG) or preservatives. °· Fast foods. °· Pre-packaged foods. °· Foods with low nutritional value (junk foods). °· Overeating. °· Eating just before going to bed. °· Hot weather. °· Dehydration. °· Not enough sleep or poor sleep quality. °· Physical exhaustion. °· Menstruation. °· Motion sickness. °· Emotional stress (school or home difficulties). °· Excitement or stress. °SYMPTOMS  °The main symptoms of cyclic vomiting syndrome are: °· Severe vomiting. °· Nausea. °· Gagging (retching). °Episodes usually begin at night or the first thing in the morning. Episodes may include vomiting or retching up to 5 or 6 times an hour during the worst of the episode. Episodes usually last anywhere from 1 to 4 days. Episodes can last for up to 10 days. Other symptoms include: °· Paleness. °· Exhaustion. °· Listlessness. °· Abdominal pain. °· Loose stools or diarrhea. °Sometimes the nausea and vomiting are so severe that a person appears to be almost unconscious. Sensitivity to light, headache, fever, dizziness, may also accompany an episode. In addition, the vomiting may cause drooling and excessive thirst. Drinking water usually leads to more vomiting, though the water can dilute the acid in the vomit, making the episode a little less painful. Continuous vomiting can lead to dehydration, which means that the body has lost excessive water and salts. °DIAGNOSIS  °Cyclic vomiting syndrome is hard to diagnose because there are no clear tests to identify it. A caregiver must diagnose cyclic vomiting syndrome by looking at symptoms and medical history. A caregiver must exclude more common diseases   or disorders that can also cause nausea and vomiting. Also, diagnosis takes time because caregivers need to identify a pattern or cycle to the vomiting. °TREATMENT  °Cyclic vomiting syndrome cannot be cured. Treatment varies, but people with  cyclic vomiting syndrome should get plenty of rest and sleep and take medications that prevent, stop, or lessen the vomiting episodes and other symptoms. °People whose episodes are frequent and long-lasting may be treated during the symptom-free intervals in an effort to prevent or ease future episodes. The symptom-free phase is a good time to eliminate anything known to trigger an episode. For example, if episodes are brought on by stress or excitement, this period is the time to find ways to reduce stress and stay calm. If sinus problems or allergies cause episodes, those conditions should be treated. The triggers listed above should be avoided or prevented. °Because of the similarities between migraine and cyclic vomiting syndrome, caregivers treat some people with severe cyclic vomiting syndrome with drugs that are also used for migraine headaches. The drugs are designed to: °· Prevent episodes. °· Reduce their frequency. °· Lessen their severity. °HOME CARE INSTRUCTIONS °Once a vomiting episode begins, treatment is supportive. It helps to stay in bed and sleep in a dark, quiet room. Severe nausea and vomiting may require hospitalization and intravenous (IV) fluids to prevent dehydration. Relaxing medications (sedatives) may help if the nausea continues. Sometimes, during the prodrome phase, it is possible to stop an episode from happening altogether. Only take over-the-counter or prescription medicines for pain, discomfort or fever as directed by your caregiver. Do not give aspirin to children. °During the recovery phase, drinking water and replacing lost electrolytes (salts in the blood) are very important. Electrolytes are salts that the body needs to function well and stay healthy. Symptoms during the recovery phase can vary. Some people find that their appetites return to normal immediately, while others need to begin by drinking clear liquids and then move slowly to solid food. °RELATED COMPLICATIONS °The  severe vomiting that defines cyclic vomiting syndrome is a risk factor for several complications: °· Dehydration--Vomiting causes the body to lose water quickly. °· Electrolyte imbalance--Vomiting also causes the body to lose the important salts it needs to keep working properly. °· Peptic esophagitis--The tube that connects the mouth to the stomach (esophagus) becomes injured from the stomach acid that comes up with the vomit. °· Hematemesis--The esophagus becomes irritated and bleeds, so blood mixes with the vomit. °· Mallory-Weiss tear--The lower end of the esophagus may tear open or the stomach may bruise from vomiting or retching. °· Tooth decay--The acid in the vomit can hurt the teeth by corroding the tooth enamel. °SEEK MEDICAL CARE IF: °You have questions or problems. °Document Released: 02/21/2002 Document Revised: 03/06/2012 Document Reviewed: 03/22/2011 °[employer]® Patient Information ©2015 [employer]. This information is not intended to replace advice given to you by your health care provider. Make sure you discuss any questions you have with your health care provider. ° °

## 2014-09-13 ENCOUNTER — Inpatient Hospital Stay: Payer: Medicare Other | Admitting: Family Medicine

## 2014-09-17 ENCOUNTER — Observation Stay (HOSPITAL_COMMUNITY)
Admission: EM | Admit: 2014-09-17 | Discharge: 2014-09-20 | Disposition: A | Discharge status: Home / Self Care | Payer: Medicare Other | Attending: Family Medicine | Admitting: Family Medicine

## 2014-09-17 ENCOUNTER — Encounter (HOSPITAL_COMMUNITY): Payer: Self-pay | Admitting: Emergency Medicine

## 2014-09-17 ENCOUNTER — Ambulatory Visit (INDEPENDENT_AMBULATORY_CARE_PROVIDER_SITE_OTHER): Payer: Medicare Other | Admitting: Family Medicine

## 2014-09-17 ENCOUNTER — Encounter: Payer: Self-pay | Admitting: Family Medicine

## 2014-09-17 ENCOUNTER — Inpatient Hospital Stay: Admission: AD | Admit: 2014-09-17 | Payer: Medicare Other | Source: Ambulatory Visit | Admitting: Family Medicine

## 2014-09-17 VITALS — BP 155/91 | HR 114 | Temp 98.1°F | Ht 61.0 in | Wt 148.0 lb

## 2014-09-17 DIAGNOSIS — E1165 Type 2 diabetes mellitus with hyperglycemia: Secondary | ICD-10-CM

## 2014-09-17 DIAGNOSIS — N179 Acute kidney failure, unspecified: Secondary | ICD-10-CM | POA: Diagnosis not present

## 2014-09-17 DIAGNOSIS — R1115 Cyclical vomiting syndrome unrelated to migraine: Secondary | ICD-10-CM | POA: Diagnosis not present

## 2014-09-17 DIAGNOSIS — F1210 Cannabis abuse, uncomplicated: Secondary | ICD-10-CM | POA: Diagnosis not present

## 2014-09-17 DIAGNOSIS — M549 Dorsalgia, unspecified: Secondary | ICD-10-CM | POA: Diagnosis not present

## 2014-09-17 DIAGNOSIS — E860 Dehydration: Secondary | ICD-10-CM | POA: Insufficient documentation

## 2014-09-17 DIAGNOSIS — F329 Major depressive disorder, single episode, unspecified: Secondary | ICD-10-CM | POA: Insufficient documentation

## 2014-09-17 DIAGNOSIS — K219 Gastro-esophageal reflux disease without esophagitis: Secondary | ICD-10-CM | POA: Diagnosis not present

## 2014-09-17 DIAGNOSIS — F411 Generalized anxiety disorder: Secondary | ICD-10-CM | POA: Insufficient documentation

## 2014-09-17 DIAGNOSIS — I129 Hypertensive chronic kidney disease with stage 1 through stage 4 chronic kidney disease, or unspecified chronic kidney disease: Secondary | ICD-10-CM | POA: Insufficient documentation

## 2014-09-17 DIAGNOSIS — N183 Chronic kidney disease, stage 3 unspecified: Secondary | ICD-10-CM | POA: Insufficient documentation

## 2014-09-17 DIAGNOSIS — Z23 Encounter for immunization: Secondary | ICD-10-CM | POA: Insufficient documentation

## 2014-09-17 DIAGNOSIS — R109 Unspecified abdominal pain: Secondary | ICD-10-CM | POA: Insufficient documentation

## 2014-09-17 DIAGNOSIS — R112 Nausea with vomiting, unspecified: Secondary | ICD-10-CM | POA: Diagnosis present

## 2014-09-17 DIAGNOSIS — D649 Anemia, unspecified: Secondary | ICD-10-CM | POA: Insufficient documentation

## 2014-09-17 DIAGNOSIS — IMO0001 Reserved for inherently not codable concepts without codable children: Secondary | ICD-10-CM | POA: Insufficient documentation

## 2014-09-17 DIAGNOSIS — G8929 Other chronic pain: Secondary | ICD-10-CM | POA: Diagnosis not present

## 2014-09-17 LAB — COMPREHENSIVE METABOLIC PANEL
ALK PHOS: 99 U/L (ref 39–117)
ALT: 9 U/L (ref 0–35)
ANION GAP: 21 — AB (ref 5–15)
AST: 16 U/L (ref 0–37)
Albumin: 4.7 g/dL (ref 3.5–5.2)
BILIRUBIN TOTAL: 0.5 mg/dL (ref 0.3–1.2)
BUN: 50 mg/dL — AB (ref 6–23)
CHLORIDE: 94 meq/L — AB (ref 96–112)
CO2: 25 mEq/L (ref 19–32)
Calcium: 10.3 mg/dL (ref 8.4–10.5)
Creatinine, Ser: 3.11 mg/dL — ABNORMAL HIGH (ref 0.50–1.10)
GFR calc Af Amer: 19 mL/min — ABNORMAL LOW (ref >90)
GFR calc non Af Amer: 16 mL/min — ABNORMAL LOW (ref >90)
Glucose, Bld: 518 mg/dL — ABNORMAL HIGH (ref 70–99)
Potassium: 3.7 mEq/L (ref 3.7–5.3)
SODIUM: 140 meq/L (ref 137–147)
Total Protein: 8.3 g/dL (ref 6.0–8.3)

## 2014-09-17 LAB — CBC WITH DIFFERENTIAL/PLATELET
BASOS PCT: 0 % (ref 0–1)
Basophils Absolute: 0 10*3/uL (ref 0.0–0.1)
Eosinophils Absolute: 0 10*3/uL (ref 0.0–0.7)
Eosinophils Relative: 0 % (ref 0–5)
HCT: 33.7 % — ABNORMAL LOW (ref 36.0–46.0)
Hemoglobin: 12 g/dL (ref 12.0–15.0)
LYMPHS ABS: 1.6 10*3/uL (ref 0.7–4.0)
Lymphocytes Relative: 10 % — ABNORMAL LOW (ref 12–46)
MCH: 31.9 pg (ref 26.0–34.0)
MCHC: 35.6 g/dL (ref 30.0–36.0)
MCV: 89.6 fL (ref 78.0–100.0)
MONOS PCT: 9 % (ref 3–12)
Monocytes Absolute: 1.4 10*3/uL — ABNORMAL HIGH (ref 0.1–1.0)
NEUTROS ABS: 12.3 10*3/uL — AB (ref 1.7–7.7)
Neutrophils Relative %: 81 % — ABNORMAL HIGH (ref 43–77)
PLATELETS: 221 10*3/uL (ref 150–400)
RBC: 3.76 MIL/uL — AB (ref 3.87–5.11)
RDW: 12.7 % (ref 11.5–15.5)
WBC: 15.2 10*3/uL — ABNORMAL HIGH (ref 4.0–10.5)

## 2014-09-17 LAB — URINALYSIS, ROUTINE W REFLEX MICROSCOPIC
BILIRUBIN URINE: NEGATIVE
Glucose, UA: >1000 mg/dL — AB
Ketones, ur: 15 mg/dL — AB
Leukocytes, UA: NEGATIVE
NITRITE: NEGATIVE
PH: 6 (ref 5.0–8.0)
Protein, ur: 100 mg/dL — AB
SPECIFIC GRAVITY, URINE: 1.018 (ref 1.005–1.030)
UROBILINOGEN UA: 0.2 mg/dL (ref 0.0–1.0)

## 2014-09-17 LAB — URINE MICROSCOPIC-ADD ON

## 2014-09-17 LAB — RAPID URINE DRUG SCREEN, HOSP PERFORMED
AMPHETAMINES: NOT DETECTED
BARBITURATES: POSITIVE — AB
Benzodiazepines: NOT DETECTED
Cocaine: NOT DETECTED
Opiates: NOT DETECTED
TETRAHYDROCANNABINOL: POSITIVE — AB

## 2014-09-17 LAB — CBG MONITORING, ED: Glucose-Capillary: 444 mg/dL — ABNORMAL HIGH (ref 70–99)

## 2014-09-17 LAB — HEMOGLOBIN A1C
Hgb A1c MFr Bld: 7.2 % — ABNORMAL HIGH (ref <5.7)
Mean Plasma Glucose: 160 mg/dL — ABNORMAL HIGH (ref <117)

## 2014-09-17 LAB — GLUCOSE, CAPILLARY
GLUCOSE-CAPILLARY: 263 mg/dL — AB (ref 70–99)
GLUCOSE-CAPILLARY: 174 mg/dL — AB (ref 70–99)

## 2014-09-17 LAB — TSH: TSH: 0.744 u[IU]/mL (ref 0.350–4.500)

## 2014-09-17 LAB — MAGNESIUM: MAGNESIUM: 1.8 mg/dL (ref 1.5–2.5)

## 2014-09-17 LAB — LIPASE, BLOOD: Lipase: 96 U/L — ABNORMAL HIGH (ref 11–59)

## 2014-09-17 MED ORDER — SODIUM CHLORIDE 0.9 % IV SOLN
INTRAVENOUS | Status: DC
  Administered 2014-09-17 – 2014-09-18 (×4): via INTRAVENOUS

## 2014-09-17 MED ORDER — LORAZEPAM 2 MG/ML IJ SOLN
0.5000 mg | INTRAMUSCULAR | Status: DC | PRN
  Filled 2014-09-17: qty 1

## 2014-09-17 MED ORDER — LORAZEPAM 2 MG/ML IJ SOLN
1.0000 mg | INTRAMUSCULAR | Status: DC | PRN
  Administered 2014-09-17: 1 mg via INTRAVENOUS

## 2014-09-17 MED ORDER — HEPARIN SODIUM (PORCINE) 5000 UNIT/ML IJ SOLN
5000.0000 [IU] | Freq: Three times a day (TID) | INTRAMUSCULAR | Status: DC
  Administered 2014-09-17 – 2014-09-19 (×8): 5000 [IU] via SUBCUTANEOUS
  Filled 2014-09-17 (×10): qty 1

## 2014-09-17 MED ORDER — INSULIN ASPART 100 UNIT/ML ~~LOC~~ SOLN: 0.0000 [IU] | Freq: Three times a day (TID) | SUBCUTANEOUS | Status: DC

## 2014-09-17 MED ORDER — HYDROMORPHONE HCL 1 MG/ML IJ SOLN
1.0000 mg | Freq: Once | INTRAMUSCULAR | Status: DC
  Administered 2014-09-17: 1 mg via INTRAVENOUS
  Filled 2014-09-17: qty 1

## 2014-09-17 MED ORDER — LORAZEPAM 2 MG/ML IJ SOLN
1.0000 mg | Freq: Four times a day (QID) | INTRAMUSCULAR | Status: DC | PRN
  Administered 2014-09-17 – 2014-09-19 (×4): 1 mg via INTRAVENOUS
  Filled 2014-09-17 (×4): qty 1

## 2014-09-17 MED ORDER — ONDANSETRON HCL 4 MG/2ML IJ SOLN: 4.0000 mg | Freq: Four times a day (QID) | INTRAMUSCULAR | Status: DC | PRN

## 2014-09-17 MED ORDER — INSULIN GLARGINE 100 UNIT/ML ~~LOC~~ SOLN
10.0000 [IU] | Freq: Every day | SUBCUTANEOUS | Status: DC
  Administered 2014-09-17 – 2014-09-20 (×4): 10 [IU] via SUBCUTANEOUS
  Filled 2014-09-17 (×4): qty 0.1

## 2014-09-17 MED ORDER — PROMETHAZINE HCL 25 MG/ML IJ SOLN
25.0000 mg | Freq: Four times a day (QID) | INTRAMUSCULAR | Status: DC | PRN
  Administered 2014-09-17 – 2014-09-18 (×2): 25 mg via INTRAVENOUS
  Filled 2014-09-17 (×3): qty 1

## 2014-09-17 MED ORDER — MORPHINE SULFATE 2 MG/ML IJ SOLN: 2.0000 mg | INTRAMUSCULAR | Status: DC | PRN

## 2014-09-17 MED ORDER — ONDANSETRON HCL 4 MG PO TABS: 4.0000 mg | ORAL_TABLET | Freq: Four times a day (QID) | ORAL | Status: DC | PRN

## 2014-09-17 MED ORDER — INFLUENZA VAC SPLIT QUAD 0.5 ML IM SUSY
0.5000 mL | PREFILLED_SYRINGE | INTRAMUSCULAR | Status: AC
  Administered 2014-09-18: 0.5 mL via INTRAMUSCULAR
  Filled 2014-09-17: qty 0.5

## 2014-09-17 MED ORDER — INSULIN ASPART 100 UNIT/ML ~~LOC~~ SOLN
0.0000 [IU] | SUBCUTANEOUS | Status: DC
  Administered 2014-09-17: 3 [IU] via SUBCUTANEOUS
  Administered 2014-09-18: 2 [IU] via SUBCUTANEOUS

## 2014-09-17 MED ORDER — HYDROMORPHONE HCL 1 MG/ML IJ SOLN
INTRAMUSCULAR | Status: AC
  Filled 2014-09-17: qty 1

## 2014-09-17 MED ORDER — HYDROMORPHONE HCL 1 MG/ML IJ SOLN
1.0000 mg | INTRAMUSCULAR | Status: DC | PRN
  Administered 2014-09-17 – 2014-09-19 (×7): 1 mg via INTRAVENOUS
  Filled 2014-09-17 (×7): qty 1

## 2014-09-17 MED ORDER — PROMETHAZINE HCL 25 MG/ML IJ SOLN
12.5000 mg | Freq: Four times a day (QID) | INTRAMUSCULAR | Status: DC | PRN
  Filled 2014-09-17: qty 1

## 2014-09-17 MED ORDER — CARVEDILOL 25 MG PO TABS
25.0000 mg | ORAL_TABLET | Freq: Two times a day (BID) | ORAL | Status: DC
  Administered 2014-09-18 – 2014-09-20 (×5): 25 mg via ORAL
  Filled 2014-09-17 (×8): qty 1

## 2014-09-17 MED ORDER — SODIUM CHLORIDE 0.9 % IJ SOLN: 3.0000 mL | Freq: Two times a day (BID) | INTRAMUSCULAR | Status: DC

## 2014-09-17 NOTE — Progress Notes (Signed)
Patient ID: [name] [name], female   DOB: 1966/11/12, 48 y.o.   MRN: [id number]  HPI:  Pt presents for a same day appointment to discuss vomiting.  Has had vomiting for 8 days now. On Monday and Wednesday of last week, went to the ER and was released both times. Has been vomiting all night long. Has pain in stomach and back. Last time she kept food down was two days ago (Sunday). No fevers. Complains of severe foot itching on both of her feet, which happens whenever she has flare of her cyclic vomiting syndrome. This current flare is consistent with prior flares during which she's needed to be admitted. Did phenergan suppository this morning. Hasn't kept any oral antiemetics down. Tried ativan but couldn't keep it down. Not using pain medicine. Also states she vomited blood overnight that was both bright red and dark red.  ROS: See HPI  [place]: cyclic vomiting syndrome, MJ use, GERD, pancreatitis, depression, anemia, HTN, CKD stage 3  PHYSICAL EXAM: BP 155/91  Pulse 114  Temp(Src) 98.1 F (36.7 C) (Oral)  Ht 5\' 1"  (1.549 m)  Wt 148 lb (67.132 kg)  BMI 27.98 kg/m2 Gen: ill appearing female sitting up on clinic bed, sweating HEENT: NCAT, mucous membranes moist, sipping at water Heart: tachycardic, regular rhythm Lungs: CTAB, no respiratory distress Abdomen: soft. TTP epigastric area. Full abd exam not possible as pt refuses to lay down Back: no CVA tenderness or TTP of lower lumbar spine Neuro: nonfocal grossly, speech intact  ASSESSMENT/PLAN:  1. Cyclic vomiting syndrome with acute flare. Need to rule out other causes of abd pain and hematemesis (acute pancreatitis, mallory weiss tear, bleeding ulcer). Will admit pt directly to hospital for IV hydration, antiemetics, further workup including CMET, CBC, lipase, UDS, and CT of abdomen. Consider GI consult if hematemesis persists.  FOLLOW UP: Being admitted directly to hospital.  Delorse Limber. Ardelia Mems, [place] [facility]

## 2014-09-17 NOTE — H&P (Addendum)
[name] [name] Admission History and Physical Service Pager: [phone]  Patient name: [name] [name] Medical record number: [id number] Date of birth: [birth year] Age: 48 y.o. Gender: female  Primary Care Provider: Conni Slipper, MD Consultants: None Code Status: Full code  Chief Complaint: Nausea and vomiting  Assessment and Plan: [name] is a 48 y.o. female presenting with nausea and vomiting . PMH is significant for cyclic vomiting syndrome, diabetes mellitus type 2, anxiety, acute pancreatitis, HTN, CKD.  #Nausea and vomiting: similar to previous episodes but with more back pain/abdominal pain. Could be mild acute pancreatitis vs cyclic vomiting (she refuses to believe marijuana contributes to symptoms and smoked earlier this week about 4 days ago)  Patient currently stable.  BM earlier today so doubt SBO.  - Admit to telemetry, vitals per unit  - Obtain CMET, Lipase, Lactic Acid, UDS, UA, Gastroccult (states vomited up blood?) - Would consider CT scan abdomen w/ contrast if not improving as she has not had one in quite sometime (however, creatinine around 2 would be hindrance).  With typical presentation for her, would hold off for now unless develops objective findings on her abdominal exam or change in vital signs - Ativan 0.5mg  q6hrs PRN, Zofran 4 mg q 8 hrs PRN, Phenergan 12.5 mg q 8 hrs PRN, Morphine 2 mg q 3 hrs PRN - NPO, ADAT with clears - IVF @ 125 cc/hr  - Can consider GI consult or RUQ [place] for cholecystitis.  However, GI has seen in past in 2010 at [name] and dx with functional GI disorder, recommended Psychiatry consult and thought related to marijuana as well.   # Diabetes mellitus, type 2: Last A1C of 8.7 on 07/18/14. Not convinced her previous AG on CMET are all from her DM (Lactic acid has not been checked which if anaerobic process form dehydration would be convincing that AG from this and not from her DM as lack of or small amount of  ketones in urine.  Could measure serum ketones or Beta-hydroxybutyric acid) - Continue home Lantus (20 U) as decreased dose of 10 units - Moderate sliding scale - Upon discharge, consider set up with Pharmacy clinic for better DM management.  #Hypertension: currently controlled - Continue home carvedilol 25mg  BID - Holding lisinopril/HCTZ in setting of dehydration from nausea and emesis w/ concern for AKI.  Can restart pending creatinine result  - Unsure of why Norvasc was stopped in past, but would highly consider restarting in her instead of HCTZ  # Normocytic Normochromic Anemia: baseline of around 10-12. 9/16 was 11.9.  2/2 anemia of chronic disease? - Monitor CBC  # CKD Stage 3-4 (baseline around 2)  - F/U CMET - Limit Nephrotoxic agents  #GERD - Continue pepcid  FEN/GI: Pepcid, NPO, NS IVF @125ml /hr Prophylaxis: heparin subq  Disposition: Place in observation pending improvement in intractable vomiting.  History of Present Illness: [name] [name] is a 48 y.o. female presenting with nausea and vomiting ongoing for the last 7-8 days. Symptoms are very similar to prior cyclic vomiting episodes, and states there is not anything different from previous episodes. Pt has been seen twice in the past week in the ED for her Sx and had lab work performed showing similar labs to her previous episodes, and she was given IVF, GI cocktail, and dilaudid, which improved her Sx immensely.  However, her Sx have increased now over the past couple of days to the point where she has been unable to keep anything down.  Does state she had  BM this AM and states she may have had some red tinged emesis earlier today as well.  Denies any CP, SOB, HA, blurred vision, sick contacts, recent fast food or restaurant food, recent travel.    Does endorse smoking marijuana about 3 days ago now but otherwise denies any alcohol or other drug use.   Review Of Systems: Per HPI with the following additions:  None Otherwise 12 point review of systems was performed and was unremarkable.  Patient Active Problem List   Diagnosis Date Noted  . At risk for polypharmacy 08/15/2014  . Vaginal discharge 08/15/2014  . Other and unspecified hyperlipidemia 08/15/2014  . Leg cramping 07/18/2014  . Cyclical vomiting [id number]  . Rotator cuff syndrome 04/19/2014  . Biceps tendinopathy of right upper extremity 04/05/2014  . Protein-calorie malnutrition, severe 12/17/2013  . Anemia 11/15/2013  . Pancreatitis, acute 10/14/2013  . CKD (chronic kidney disease) 08/17/2013  . Abdominal pain, epigastric 04/19/2013  . Esophagitis 04/18/2013  . Preventative health care 09/18/2012  . High risk social situation 08/31/2012  . Allergic rhinitis, seasonal 05/04/2012  . Marijuana smoker 11/03/2011  . Uncontrolled secondary diabetes mellitus with stage 3 CKD (GFR 30-59) 10/23/2011  . Cyclic vomiting syndrome 08/03/2011  . Hot flashes 07/29/2011  . BACK PAIN, CHRONIC 04/10/2007  . DEPRESSION, MAJOR, RECURRENT 02/23/2007  . ANXIETY 02/23/2007  . TOBACCO DEPENDENCE 02/23/2007  . MIGRAINE, UNSPEC., W/O INTRACTABLE MIGRAINE 02/23/2007  . Essential hypertension, malignant 02/23/2007  . INSOMNIA NOS 02/23/2007  . NEUROPATHY, DIABETIC 02/23/2007  . GERD 01/27/2004   Past Medical History: Past Medical History  Diagnosis Date  . PANCREATITIS 05/09/2008  . Cyclic vomiting syndrome   . Hypertension   . Complication of anesthesia     "problems waking up"  . Heart murmur   . Myocardial infarction 07/2007    "they say I've had a silent one; I don't know"  . Pneumonia   . Anemia   . Blood transfusion   . GERD (gastroesophageal reflux disease)   . Anxiety   . Depression   . Smoker   . Marijuana smoker, continuous   . NEUROPATHY, DIABETIC 02/23/2007  . Type II diabetes mellitus   . Diabetic gastroparesis     /e-chart  . Chronic kidney disease (CKD), stage III (moderate)    Past Surgical History: Past Surgical  History  Procedure Laterality Date  . Port-a-cath placement  ~ 2008    right chest; "poor access; frequent hospitalizations"  . Laparotomy and lysis of adhesions    . Abdominal hysterectomy  02/2002  . Cholecystectomy  1980's  . Cardiac catheterization    . Dilation and evacuation  X3  . Left hand surgery    . Cataract extraction w/ intraocular lens  implant, bilateral    . Colonoscopy    . Upper gi endoscopy     Social History: History  Substance Use Topics  . Smoking status: Current Some Day Smoker -- 0.50 packs/day for 22 years    Types: Cigarettes  . Smokeless tobacco: Never Used  . Alcohol Use: No   Family History: Family History  Problem Relation Age of Onset  . Diabetes type II Mother   . Diabetes Mother   . Hypertension Mother   . Diabetes type II Sister   . Diabetes Sister   . Hypertension Sister   . Hypertension Brother    Allergies and Medications: Allergies  Allergen Reactions  . Bee Venom Anaphylaxis  . Acetaminophen Nausea And Vomiting  .  Novolog [Insulin Aspart] Other (See Comments)    Muscles in feet cramp  . Erythromycin Nausea And Vomiting   Current Outpatient Prescriptions on File Prior to Visit  Medication Sig Dispense Refill  . carvedilol (COREG) 25 MG tablet Take 25 mg by mouth 2 (two) times daily with a meal.      . hydrochlorothiazide (HYDRODIURIL) 25 MG tablet Take 25 mg by mouth daily.      . insulin glargine (LANTUS) 100 UNIT/ML injection Inject 20 Units into the skin daily.       Marland Kitchen lisinopril (PRINIVIL,ZESTRIL) 40 MG tablet Take 40 mg by mouth daily.      Marland Kitchen LORazepam (ATIVAN) 0.5 MG tablet Take 3 tablets (1.5 mg total) by mouth 2 (two) times daily.  10 tablet  0  . promethazine (PHENERGAN) 25 MG suppository Place 25 mg rectally every 6 (six) hours as needed for nausea or vomiting.      . promethazine (PHENERGAN) 25 MG suppository Place 1 suppository (25 mg total) rectally every 6 (six) hours as needed for nausea or vomiting.  12 each  0  .  promethazine (PHENERGAN) 25 MG tablet Take 25 mg by mouth every 6 (six) hours as needed for nausea or vomiting.      . SUMAtriptan (IMITREX) 50 MG tablet Take 50 mg by mouth every 2 (two) hours as needed for migraine. May repeat in 2 hours if headache persists or recurs.       Current Facility-Administered Medications on File Prior to Visit  Medication Dose Route Frequency Provider Last Rate Last Dose  . 0.9 %  sodium chloride infusion   Intravenous Continuous Andrena Mews, MD 20 mL/hr at 04/19/13 1500 500 mL at 04/19/13 1500    Objective: BP 155/91  Pulse 114  Temp(Src) 98.1 F (36.7 C) (Oral)  Ht 5\' 1"  (1.549 m)  Wt 148 lb (67.132 kg)  BMI 27.98 kg/m2  Exam: General: Laying in bed in no acute distress HEENT: PERRL, EOMI, dry mucous membranes,  Cardiovascular: Regular rate and rhythm, no murmur, 2+ distal pulses Respiratory: Clear to auscultation bilaterally, no wheezing Abdomen: Soft, NABS, NT/ND, no CVA tenderness  Extremities: Moves spontaneously, no calf tenderness or erythema or asymmetry Skin: No cyanosis Neuro: Alert and oriented x3  Labs and Imaging: CBC BMET   Recent Labs Lab 09/11/14 0810  WBC 7.1  HGB 11.9*  HCT 34.2*  PLT 193    Recent Labs Lab 09/11/14 1037  NA 139  K 3.9  CL 106  CO2 19  BUN 30*  CREATININE 2.14*  GLUCOSE 134*  CALCIUM 9.4     DG abdomen 1 view 09/11/14- IMPRESSION:  No significant change in in large stool burden in the transverse  colon. No evidence of bowel obstruction.   Nolon Rod, DO 09/17/2014, 9:35 AM PGY-3, [place] Intern pager: [phone], text pages welcome

## 2014-09-17 NOTE — Progress Notes (Signed)
Admission note:  Arrival Method:  Patient came from ED on stretcher with a staff member accompanying. Mental Orientation: Alert and oriented x 4. Telemetry: Patient on telemetry, Harris notified. Assessment: See doc flowsheets. Skin: Warm, dry and intact. IV: Implanted port right chest, infusing NS 14ml/hr Pain: Patient c/o pain in the lower back, given Dilaudid. Fall Prevention Safety Plan:  Educated the patient about fall prevention plan, she understood and acknowledged. Admission Screening: In progress. 6700 Orientation: Patient has been oriented to the unit, staff and to the room.

## 2014-09-17 NOTE — ED Notes (Signed)
Pt coming from home with c/o of abdominal pain, back pain, nausea and vomiting x 8 days.  Pt has had emesis of unknown quantity in the past 24 hours.  Pt was being seen by family medicine today when she was brought over to be admitted.

## 2014-09-17 NOTE — ED Provider Notes (Signed)
48 y.o. F with known hx of cyclic vomiting, seen in family medicine clinic today for similar sx and admitted for symptomatic control.  Work-up pending, patient currently in ED waiting for bed placement.  She continues to complain of nausea and abdominal pain but is requesting ice chips.  She is afebrile and non-toxic but does appear uncomfortable.  Family medicine service has evaluated again in ED and ordered additional meds.  Patient with mild tachycardia and elevated BP on arrival to ED.  Results for orders placed during the hospital encounter of 09/17/14  LIPASE, BLOOD      Result Value Ref Range   Lipase 96 (*) 11 - 59 U/L  URINE RAPID DRUG SCREEN (HOSP PERFORMED)      Result Value Ref Range   Opiates NONE DETECTED  NONE DETECTED   Cocaine NONE DETECTED  NONE DETECTED   Benzodiazepines NONE DETECTED  NONE DETECTED   Amphetamines NONE DETECTED  NONE DETECTED   Tetrahydrocannabinol POSITIVE (*) NONE DETECTED   Barbiturates POSITIVE (*) NONE DETECTED  CBC WITH DIFFERENTIAL      Result Value Ref Range   WBC 15.2 (*) 4.0 - 10.5 K/uL   RBC 3.76 (*) 3.87 - 5.11 MIL/uL   Hemoglobin 12.0  12.0 - 15.0 g/dL   HCT 33.7 (*) 36.0 - 46.0 %   MCV 89.6  78.0 - 100.0 fL   MCH 31.9  26.0 - 34.0 pg   MCHC 35.6  30.0 - 36.0 g/dL   RDW 12.7  11.5 - 15.5 %   Platelets 221  150 - 400 K/uL   Neutrophils Relative % 81 (*) 43 - 77 %   Neutro Abs 12.3 (*) 1.7 - 7.7 K/uL   Lymphocytes Relative 10 (*) 12 - 46 %   Lymphs Abs 1.6  0.7 - 4.0 K/uL   Monocytes Relative 9  3 - 12 %   Monocytes Absolute 1.4 (*) 0.1 - 1.0 K/uL   Eosinophils Relative 0  0 - 5 %   Eosinophils Absolute 0.0  0.0 - 0.7 K/uL   Basophils Relative 0  0 - 1 %   Basophils Absolute 0.0  0.0 - 0.1 K/uL  COMPREHENSIVE METABOLIC PANEL      Result Value Ref Range   Sodium 140  137 - 147 mEq/L   Potassium 3.7  3.7 - 5.3 mEq/L   Chloride 94 (*) 96 - 112 mEq/L   CO2 25  19 - 32 mEq/L   Glucose, Bld 518 (*) 70 - 99 mg/dL   BUN 50 (*) 6 - 23  mg/dL   Creatinine, Ser 3.11 (*) 0.50 - 1.10 mg/dL   Calcium 10.3  8.4 - 10.5 mg/dL   Total Protein 8.3  6.0 - 8.3 g/dL   Albumin 4.7  3.5 - 5.2 g/dL   AST 16  0 - 37 U/L   ALT 9  0 - 35 U/L   Alkaline Phosphatase 99  39 - 117 U/L   Total Bilirubin 0.5  0.3 - 1.2 mg/dL   GFR calc non Af Amer 16 (*) >90 mL/min   GFR calc Af Amer 19 (*) >90 mL/min   Anion gap 21 (*) 5 - 15  MAGNESIUM      Result Value Ref Range   Magnesium 1.8  1.5 - 2.5 mg/dL  TSH      Result Value Ref Range   TSH 0.744  0.350 - 4.500 uIU/mL  URINALYSIS, ROUTINE W REFLEX MICROSCOPIC      Result  Value Ref Range   Color, Urine YELLOW  YELLOW   APPearance CLOUDY (*) CLEAR   Specific Gravity, Urine 1.018  1.005 - 1.030   pH 6.0  5.0 - 8.0   Glucose, UA >1000 (*) NEGATIVE mg/dL   Hgb urine dipstick MODERATE (*) NEGATIVE   Bilirubin Urine NEGATIVE  NEGATIVE   Ketones, ur 15 (*) NEGATIVE mg/dL   Protein, ur 100 (*) NEGATIVE mg/dL   Urobilinogen, UA 0.2  0.0 - 1.0 mg/dL   Nitrite NEGATIVE  NEGATIVE   Leukocytes, UA NEGATIVE  NEGATIVE  URINE MICROSCOPIC-ADD ON      Result Value Ref Range   Squamous Epithelial / LPF FEW (*) RARE   WBC, UA 0-2  <3 WBC/hpf   RBC / HPF 3-6  <3 RBC/hpf   Bacteria, UA RARE  RARE   Casts HYALINE CASTS (*) NEGATIVE   Dg Abd 1 View  09/11/2014   CLINICAL DATA:  abdominal pain; hx of sbo  EXAM: ABDOMEN - 1 VIEW  COMPARISON:  Radiograph 09/09/2014  FINDINGS: There is a persistent large volume stool in the hepatic flexure of the transverse colon as well throughout the transverse colon. There is gas in the rectum. No dilated loops of large or small bowel. Cholecystectomy clips noted.  IMPRESSION: No significant change in in large stool burden in the transverse colon. No evidence of bowel obstruction.   Electronically Signed   By: Suzy Bouchard M.D.   On: 09/11/2014 08:54   Dg Abd Acute W/chest  09/09/2014   CLINICAL DATA:  Abdominal pain.  EXAM: ACUTE ABDOMEN SERIES (ABDOMEN 2 VIEW & CHEST 1  VIEW)  COMPARISON:  May 13, 2014.  FINDINGS: There is no evidence of dilated bowel loops or free intraperitoneal air. Large amount of stool is noted in the transverse colon. Status post cholecystectomy. Phleboliths are noted in the pelvis. Heart size and mediastinal contours are within normal limits. Both lungs are clear.  IMPRESSION: Large amount of stool seen in the transverse colon concerning for constipation.   Electronically Signed   By: Sabino Dick M.D.   On: 09/09/2014 15:11    Labs with evidence of AKI.  Glucose 518 with elevated anion gap of 21.  U/a with small ketones.  Patient does admit she has been non-compliant with her meds over the past week.  Additional fluids ordered, dose of insulin given.  VS have improved at this time.  Larene Pickett, PA-C 09/17/14 1601

## 2014-09-17 NOTE — Progress Notes (Signed)
Attempted to receive report.  RN  Unavailable at this time.  ED RN to call unit.

## 2014-09-17 NOTE — Progress Notes (Signed)
BP 102/49. Asymptomatic. Patient requesting dilaudid IV for 7 out of 10 pain in stomach.  Notified MD on call; orders to recheck BP and give dilaudid IV when diastolic BP is 60 or above.  Notified patient; acknowledged understanding. Will continue to monitor and recheck BP.

## 2014-09-18 ENCOUNTER — Encounter (HOSPITAL_COMMUNITY): Payer: Self-pay | Admitting: General Practice

## 2014-09-18 DIAGNOSIS — R1115 Cyclical vomiting syndrome unrelated to migraine: Principal | ICD-10-CM

## 2014-09-18 LAB — GLUCOSE, CAPILLARY
GLUCOSE-CAPILLARY: 31 mg/dL — AB (ref 70–99)
GLUCOSE-CAPILLARY: 72 mg/dL (ref 70–99)
GLUCOSE-CAPILLARY: 73 mg/dL (ref 70–99)
GLUCOSE-CAPILLARY: 92 mg/dL (ref 70–99)
Glucose-Capillary: 89 mg/dL (ref 70–99)
Glucose-Capillary: 136 mg/dL — ABNORMAL HIGH (ref 70–99)
Glucose-Capillary: 114 mg/dL — ABNORMAL HIGH (ref 70–99)
Glucose-Capillary: 168 mg/dL — ABNORMAL HIGH (ref 70–99)

## 2014-09-18 LAB — CBC
HCT: 30 % — ABNORMAL LOW (ref 36.0–46.0)
Hemoglobin: 10 g/dL — ABNORMAL LOW (ref 12.0–15.0)
MCH: 31.3 pg (ref 26.0–34.0)
MCHC: 33.3 g/dL (ref 30.0–36.0)
MCV: 94 fL (ref 78.0–100.0)
Platelets: 148 10*3/uL — ABNORMAL LOW (ref 150–400)
RBC: 3.19 MIL/uL — ABNORMAL LOW (ref 3.87–5.11)
RDW: 13.3 % (ref 11.5–15.5)
WBC: 9.2 10*3/uL (ref 4.0–10.5)

## 2014-09-18 LAB — COMPREHENSIVE METABOLIC PANEL
ALBUMIN: 3.7 g/dL (ref 3.5–5.2)
ALT: 10 U/L (ref 0–35)
ANION GAP: 13 (ref 5–15)
AST: 19 U/L (ref 0–37)
Alkaline Phosphatase: 74 U/L (ref 39–117)
BUN: 38 mg/dL — AB (ref 6–23)
CALCIUM: 8.2 mg/dL — AB (ref 8.4–10.5)
CO2: 23 mEq/L (ref 19–32)
CREATININE: 2.22 mg/dL — AB (ref 0.50–1.10)
Chloride: 105 mEq/L (ref 96–112)
GFR calc Af Amer: 29 mL/min — ABNORMAL LOW (ref >90)
GFR calc non Af Amer: 25 mL/min — ABNORMAL LOW (ref >90)
Glucose, Bld: 131 mg/dL — ABNORMAL HIGH (ref 70–99)
Potassium: 3.8 mEq/L (ref 3.7–5.3)
Sodium: 141 mEq/L (ref 137–147)
Total Bilirubin: 0.3 mg/dL (ref 0.3–1.2)
Total Protein: 6.8 g/dL (ref 6.0–8.3)

## 2014-09-18 MED ORDER — INSULIN ASPART 100 UNIT/ML ~~LOC~~ SOLN
0.0000 [IU] | Freq: Three times a day (TID) | SUBCUTANEOUS | Status: DC
  Administered 2014-09-18 – 2014-09-19 (×3): 2 [IU] via SUBCUTANEOUS
  Administered 2014-09-20: 1 [IU] via SUBCUTANEOUS

## 2014-09-18 MED ORDER — PANTOPRAZOLE SODIUM 40 MG IV SOLR
40.0000 mg | Freq: Every day | INTRAVENOUS | Status: DC
  Administered 2014-09-18 – 2014-09-19 (×2): 40 mg via INTRAVENOUS
  Filled 2014-09-18 (×3): qty 40

## 2014-09-18 MED ORDER — GI COCKTAIL ~~LOC~~
30.0000 mL | Freq: Once | ORAL | Status: AC
  Administered 2014-09-18: 30 mL via ORAL
  Filled 2014-09-18: qty 30

## 2014-09-18 NOTE — Discharge Summary (Signed)
[facility] Discharge Summary  Patient name: [name] [name] Medical record number: [id number] Date of birth: 01-16-1966 Age: 48 y.o. Gender: female Date of Admission: 09/17/2014  Date of Discharge: 09/20/2014 Admitting Physician: Willeen Niece, MD  Primary Care Provider: Conni Slipper, MD Consultants: None  Indication for Hospitalization: Intractable nausea and vomiting  Discharge Diagnoses/Problem List:  Cyclical Vomiting Syndrome 2/2 Marijuana abuse - resolved Anxiety Chronic Back Pain Major Depression GERD CKD III Uncontrolled DMII  Disposition: Home  Discharge Condition: Improved - Stable  Discharge Exam:  Filed Vitals:   09/20/14 1004  BP: 139/64  Pulse: 58  Temp: 98.8 F (37.1 C)  Resp: 20  Gen: NAD, AAOx3 HEENT: NCAT, PERRLA, EOMI CV: RRR, No MGR, No TTP Resp: CTA Bilaterally Abd: S, NT, ND, No organomegally, +BS Ext: 2+ distal pulses bilaterally, WWP Neuro: Grossly neurologically intact.   Brief Hospital Course:  [name] [name] is a 48 y.o. female presenting with nausea and vomiting . PMH is significant for cyclic vomiting syndrome, diabetes mellitus type 2, anxiety, acute pancreatitis, HTN, CKD. Also with AoCKD here.  #Nausea and vomiting likely secondary to marijuana use: Patient presented with similar to previous episodes but with more back pain/abdominal pain. Admission labs were remarkable for lipase of 96 (baseline 70s to 80s) and leukocytosis (15.2 > 5.5). UA was unremarkable for any signs of infection. Patient was started on IVF, zofran, phenergan, and morphine prn. Her symptoms gradually improved, her diet was slowly advanced, and she was back to near her baseline at the time of discharge.   #AoCKD Stage 3-4. Patient's creatinine 3.11 here (Baseline around 2). Likely prerenal in setting of dehydration secondary to vomiting. Patient improved with IVF and was back to near baseline 1.95 at the time of discharge.   #  Diabetes mellitus, type 2: Patient's A1C here was 7.2. Her CBGs here were initially high (400s), however quickly responded to insulin. She was continued on Lantus and SSI while here. CBG's in 140's at discharge.   #Hypertension: Patient was controlled on her home carvedilol 25mg  BID. We held her lisinopril/HCTZ in the setting of dehydration from nausea and emesis w/ concern for AKI.   # Normocytic Normochromic Anemia: HgB 12 here. Baseline of around 10-12.   #GERD: Continued home pepcid.    Issues for Follow Up:  - Cyclical Vomiting - advise against excessive marijuana use, follow up symptoms.  [facility] Fabienne Bruns, Dr Lanny Cramp- GI motility specialist. -HTN consider norvasc instead of HCTZ  Significant Procedures: None  Significant Labs and Imaging:   Recent Labs Lab 09/18/14 1123 09/19/14 0555 09/20/14 0549  WBC 9.2 6.7 5.5  HGB 10.0* 9.0* 9.6*  HCT 30.0* 26.1* 27.9*  PLT 148* 154 154    Recent Labs Lab 09/17/14 1211 09/18/14 1123 09/19/14 0555 09/19/14 0920 09/20/14 0549  NA 140 141 140 138 138  K 3.7 3.8 6.3* 4.4 4.6  CL 94* 105 106 105 105  CO2 25 23 25 25 25   GLUCOSE 518* 131* 88 140* 125*  BUN 50* 38* 24* 22 20  CREATININE 3.11* 2.22* 2.03* 1.93* 1.95*  CALCIUM 10.3 8.2* 6.0* 8.5 8.8  MG 1.8  --   --   --   --   ALKPHOS 99 74 63  --  60  AST 16 19 14   --  11  ALT 9 10 8   --  7  ALBUMIN 4.7 3.7 3.1*  --  3.1*    Results/Tests Pending at Time of Discharge:  None  Discharge Medications:    Medication List         carvedilol 25 MG tablet  Commonly known as:  COREG  Take 25 mg by mouth 2 (two) times daily with a meal.     hydrochlorothiazide 25 MG tablet  Commonly known as:  HYDRODIURIL  Take 25 mg by mouth daily.     insulin glargine 100 UNIT/ML injection  Commonly known as:  LANTUS  Inject 20 Units into the skin daily.     lisinopril 40 MG tablet  Commonly known as:  PRINIVIL,ZESTRIL  Take 40 mg by mouth daily.     LORazepam 0.5 MG tablet  Commonly  known as:  ATIVAN  Take 3 tablets (1.5 mg total) by mouth 2 (two) times daily.     promethazine 25 MG tablet  Commonly known as:  PHENERGAN  Take 25 mg by mouth every 6 (six) hours as needed for nausea or vomiting.     promethazine 25 MG suppository  Commonly known as:  PHENERGAN  Place 1 suppository (25 mg total) rectally every 6 (six) hours as needed for nausea or vomiting.     SUMAtriptan 50 MG tablet  Commonly known as:  IMITREX  Take 50 mg by mouth every 2 (two) hours as needed for migraine. May repeat in 2 hours if headache persists or recurs.        Discharge Instructions: Please refer to Patient Instructions section of EMR for full details.  Patient was counseled important signs and symptoms that should prompt return to medical care, changes in medications, dietary instructions, activity restrictions, and follow up appointments.   Follow-Up Appointments: Follow-up Information   Follow up with Merla Riches, MD On 09/23/2014. (1:45pm for hospital follow up)    Specialty:  Family Medicine   Contact information:   [place] [phone]       Aquilla Hacker, MD 09/20/2014, 12:37 PM PGY-1, Progress

## 2014-09-18 NOTE — Progress Notes (Signed)
FMTS Attending Note Patient seen and examined by me, discussed with resident team and I agree with Dr Marigene Ehlers note for today.  Patient is markedly improved from standpoint of her nausea/vomiting. Is interested in advancing her diet today.  Abdomen is soft, and nontender.  Plan to increase orals, wean down IVF.  She is dejected about the recurrence of symptoms and hospitalizations.  Plan to establish follow up with GI at [facility], motility disorders, as outpatient.  She has a car now and states she can get herself there.   Dalbert Mayotte, mD

## 2014-09-18 NOTE — ED Provider Notes (Signed)
History/physical exam/procedure(s) were performed by non-physician practitioner and as supervising physician I was immediately available for consultation/collaboration. I have reviewed all notes and am in agreement with care and plan.   Shaune Pollack, MD 09/18/14 [phone]

## 2014-09-18 NOTE — Progress Notes (Signed)
Spoke with patient about diabetes and home regimen for diabetes control. Patient reports that she is followed by her PCP for diabetes management and currently she takes Lantus 22 units daily as an outpatient for diabetes control. Inquired about taking insulin over the past few days prior to admission. Patient reports that she was not taking her insulin since she was not able to eat and was vomiting.  Discussed basic pathophysiology of DM Type 2, basic home care, importance of checking CBGs and maintaining good CBG control to prevent long-term and short-term complications. Discussed impact of nutrition, exercise, stress, sickness, and medications on diabetes control.  Patient states that she has been told different things from various doctors about her insulin when she is sick. Explained why it is important to continue to take Lantus but she needs to ask her doctor that manages her diabetes about whether she should take the same or adjusted doses when she is sick and not able to eat or keep anything down. In talking with the patient she verbalized how frustrated she is about recurrent episodes of nausea and vomiting. She states that she feels she is trapped by her health conditions because she can not make plans to do anything because she never knows how she is going to feel from one day to the next. She states that she use to go to GI specialist at [facility] and would like to go back there.  Provided emotional support and encouraged patient to talk with her PCP regarding how she is feeling.  Patient verbalized understanding of information discussed and she states that she has no further questions at this time related to diabetes.   Thanks, Barnie Alderman, RN, MSN, CCRN Diabetes Coordinator Inpatient Diabetes Program [phone] (Team Pager) [phone] ([facility] office) [phone] [facility] office)

## 2014-09-18 NOTE — Progress Notes (Signed)
Hypoglycemic Event  CBG: 31  Treatment: 4 oz apple juice  Symptoms: Hungry and Nervous/irritable  Follow-up CBG: [id number] CBG Result:89  Possible Reasons for Event: Inadequate meal intake-NPO  Comments/MD notified:Family Resident on-call MD notified.  Patient previously NPO, but became irritable and demanded to have a clear liquid diet.  Refused amp of D50, and demanded juice only.   [name] [name]  Remember to initiate Hypoglycemia Order Set & complete

## 2014-09-18 NOTE — Progress Notes (Signed)
UR completed 

## 2014-09-18 NOTE — Progress Notes (Signed)
Family Medicine Teaching Service Daily Progress Note Intern Pager: [phone]  Patient name: [name] [name] Medical record number: [id number] Date of birth: [birth year] Age: 48 y.o. Gender: female  Primary Care Provider: Conni Slipper, MD Consultants: None Code Status: Full  Pt Overview and Major Events to Date:  9/22 - Admitted with nausea, vomiting, and abdominal pain  Assessment and Plan: [name] [name] is a 48 y.o. female presenting with nausea and vomiting . PMH is significant for cyclic vomiting syndrome, diabetes mellitus type 2, anxiety, acute pancreatitis, HTN, CKD.   #Nausea and vomiting: Similar to previous episodes but with more back pain/abdominal pain. Differential includes mild acute pancreatitis vs cyclic vomiting 2/2 marijuana use.   - Admit to telemetry, vitals per unit  - Lipase 96 - f/u Gastroccult (questionable hematemesis prior to admission) - UA negative for signs of infection - UDS positive for THC and barbiturates.  - Consider CT if not improving - Ativan 0.5mg  q6hrs PRN, Zofran 4 mg q 8 hrs PRN, Phenergan 12.5 mg q 8 hrs PRN, Morphine 2 mg q 3 hrs PRN  - ADAT - IVF @ 125 cc/hr, consider decreasing rate pending cmet  #AoCKD Stage 3-4. Baseline Cr around 2. Here 3.11 on admission. Likely prerenal in setting of dehydration secondary to vomiting - IVF as above - f/u cmet - limit nephrotoxic agents  # Diabetes mellitus, type 2: A1C here 7.2. Increased AG (22) on admission, though possibly due to lactic acidemia or ketones secondary to dehydrtation. CBGs here initially high (400s), however quickly responded to insulin, eventually hypoglycemic (31) - Continue home Lantus (20 U) as decreased dose of 10 units  - Sensitive SSI - Upon discharge, consider set up with Pharmacy clinic for better DM management.   #Hypertension: currently controlled  - Continue home carvedilol 25mg  BID  - Holding lisinopril/HCTZ in setting of dehydration from nausea and emesis w/  concern for AKI. Can restart pending creatinine result. - Unsure of why Norvasc was stopped in past, but would highly consider restarting in her instead of HCTZ   # Normocytic Normochromic Anemia: HgB 12 here. Baseline of around 10-12. Likely secondary to CKD - Monitor CBC   #GERD  - Continue pepcid   FEN/GI: Pepcid, ADAT, NS IVF @125ml /hr  Prophylaxis: heparin subq   Disposition: Admitted to observation pending improvement of intractable vomiting.  Subjective:  Doing better this morning. Still with some nausea. Tolerated liquid diet. Says that she is very thirsty. Still has abdominal pain and low back pain.  Objective: Temp:  [98 F (36.7 C)-99 F (37.2 C)] 98.6 F (37 C) (09/23 0427) Pulse Rate:  [76-111] 76 (09/23 0427) Resp:  [12-24] 18 (09/23 0427) BP: (97-189)/(49-168) 124/66 mmHg (09/23 0427) SpO2:  [97 %-100 %] 100 % (09/23 0427) Weight:  [148 lb (67.132 kg)] 148 lb (67.132 kg) (09/22 1123) Physical Exam: General: Sitting in bed in no acute distress. Cardiovascular: RRR, no murmurs appreciated Respiratory: NWOB, CTAB Abdomen: +BS, soft, mildly tender to palpation in epigastric area, nondistended Extremities: No edema or cyanosis  Laboratory:  Recent Labs Lab 09/17/14 1211  WBC 15.2*  HGB 12.0  HCT 33.7*  PLT 221    Recent Labs Lab 09/11/14 1037 09/17/14 1211  NA 139 140  K 3.9 3.7  CL 106 94*  CO2 19 25  BUN 30* 50*  CREATININE 2.14* 3.11*  CALCIUM 9.4 10.3  PROT 7.0 8.3  BILITOT 0.3 0.5  ALKPHOS 75 99  ALT 7 9  AST 12 16  GLUCOSE 134* 518*     Recent Labs Lab 09/18/14 0006 09/18/14 0120 09/18/14 0149 09/18/14 0424 09/18/14 0813  GLUCAP 72 31* 89 136* 114*   Urinalysis    Component Value Date/Time   COLORURINE YELLOW 09/17/2014 1258   APPEARANCEUR CLOUDY* 09/17/2014 1258   LABSPEC 1.018 09/17/2014 1258   PHURINE 6.0 09/17/2014 1258   GLUCOSEU >1000* 09/17/2014 1258   HGBUR MODERATE* 09/17/2014 1258   HGBUR small 10/19/2010 0852    BILIRUBINUR NEGATIVE 09/17/2014 1258   KETONESUR 15* 09/17/2014 1258   PROTEINUR 100* 09/17/2014 1258   UROBILINOGEN 0.2 09/17/2014 1258   NITRITE NEGATIVE 09/17/2014 1258   LEUKOCYTESUR NEGATIVE 09/17/2014 1258   Mg 1.8 TSH 0.744 UDS: Positive for barbiturates, THC Lipase: 96   Dimas Chyle, MD 09/18/2014, 9:26 AM PGY-1, [place] Intern pager: [phone], text pages welcome

## 2014-09-19 DIAGNOSIS — R1115 Cyclical vomiting syndrome unrelated to migraine: Secondary | ICD-10-CM | POA: Diagnosis not present

## 2014-09-19 LAB — CBC
HCT: 26.1 % — ABNORMAL LOW (ref 36.0–46.0)
Hemoglobin: 9 g/dL — ABNORMAL LOW (ref 12.0–15.0)
MCH: 31.7 pg (ref 26.0–34.0)
MCHC: 34.5 g/dL (ref 30.0–36.0)
MCV: 91.9 fL (ref 78.0–100.0)
Platelets: 154 10*3/uL (ref 150–400)
RBC: 2.84 MIL/uL — ABNORMAL LOW (ref 3.87–5.11)
RDW: 13.1 % (ref 11.5–15.5)
WBC: 6.7 10*3/uL (ref 4.0–10.5)

## 2014-09-19 LAB — COMPREHENSIVE METABOLIC PANEL
ALBUMIN: 3.1 g/dL — AB (ref 3.5–5.2)
ALK PHOS: 63 U/L (ref 39–117)
ALT: 8 U/L (ref 0–35)
ANION GAP: 9 (ref 5–15)
AST: 14 U/L (ref 0–37)
BILIRUBIN TOTAL: 0.3 mg/dL (ref 0.3–1.2)
BUN: 24 mg/dL — ABNORMAL HIGH (ref 6–23)
CHLORIDE: 106 meq/L (ref 96–112)
CO2: 25 meq/L (ref 19–32)
CREATININE: 2.03 mg/dL — AB (ref 0.50–1.10)
Calcium: 6 mg/dL — CL (ref 8.4–10.5)
GFR calc Af Amer: 32 mL/min — ABNORMAL LOW (ref >90)
GFR, EST NON AFRICAN AMERICAN: 28 mL/min — AB (ref >90)
Glucose, Bld: 88 mg/dL (ref 70–99)
POTASSIUM: 6.3 meq/L — AB (ref 3.7–5.3)
Sodium: 140 mEq/L (ref 137–147)
Total Protein: 5.9 g/dL — ABNORMAL LOW (ref 6.0–8.3)

## 2014-09-19 LAB — BASIC METABOLIC PANEL
ANION GAP: 8 (ref 5–15)
BUN: 22 mg/dL (ref 6–23)
CHLORIDE: 105 meq/L (ref 96–112)
CO2: 25 meq/L (ref 19–32)
Calcium: 8.5 mg/dL (ref 8.4–10.5)
Creatinine, Ser: 1.93 mg/dL — ABNORMAL HIGH (ref 0.50–1.10)
GFR calc Af Amer: 34 mL/min — ABNORMAL LOW (ref >90)
GFR calc non Af Amer: 29 mL/min — ABNORMAL LOW (ref >90)
Glucose, Bld: 140 mg/dL — ABNORMAL HIGH (ref 70–99)
POTASSIUM: 4.4 meq/L (ref 3.7–5.3)
Sodium: 138 mEq/L (ref 137–147)

## 2014-09-19 LAB — GLUCOSE, CAPILLARY
GLUCOSE-CAPILLARY: 92 mg/dL (ref 70–99)
GLUCOSE-CAPILLARY: 185 mg/dL — AB (ref 70–99)
GLUCOSE-CAPILLARY: 152 mg/dL — AB (ref 70–99)
Glucose-Capillary: 140 mg/dL — ABNORMAL HIGH (ref 70–99)
Glucose-Capillary: 75 mg/dL (ref 70–99)
Glucose-Capillary: 87 mg/dL (ref 70–99)

## 2014-09-19 MED ORDER — GI COCKTAIL ~~LOC~~
30.0000 mL | Freq: Once | ORAL | Status: AC
  Administered 2014-09-19: 30 mL via ORAL
  Filled 2014-09-19 (×2): qty 30

## 2014-09-19 MED ORDER — SODIUM POLYSTYRENE SULFONATE 15 GM/60ML PO SUSP
15.0000 g | Freq: Once | ORAL | Status: DC
  Filled 2014-09-19: qty 60

## 2014-09-19 MED ORDER — PROMETHAZINE HCL 25 MG RE SUPP: 25.0000 mg | Freq: Four times a day (QID) | RECTAL | Status: DC | PRN

## 2014-09-19 MED ORDER — PROMETHAZINE HCL 25 MG PO TABS: 25.0000 mg | ORAL_TABLET | Freq: Four times a day (QID) | ORAL | Status: DC | PRN

## 2014-09-19 MED ORDER — SODIUM CHLORIDE 0.9 % IJ SOLN
10.0000 mL | INTRAMUSCULAR | Status: DC | PRN
  Administered 2014-09-19 (×2): 10 mL

## 2014-09-19 MED ORDER — LORAZEPAM 1 MG PO TABS
1.5000 mg | ORAL_TABLET | Freq: Two times a day (BID) | ORAL | Status: DC
  Administered 2014-09-19 – 2014-09-20 (×3): 1.5 mg via ORAL
  Filled 2014-09-19 (×6): qty 1

## 2014-09-19 MED ORDER — GI COCKTAIL ~~LOC~~
30.0000 mL | Freq: Once | ORAL | Status: AC
  Administered 2014-09-19: 30 mL via ORAL
  Filled 2014-09-19: qty 30

## 2014-09-19 MED ORDER — PANTOPRAZOLE SODIUM 40 MG PO TBEC
40.0000 mg | DELAYED_RELEASE_TABLET | Freq: Every day | ORAL | Status: DC
  Administered 2014-09-19 – 2014-09-20 (×2): 40 mg via ORAL
  Filled 2014-09-19 (×2): qty 1

## 2014-09-19 MED ORDER — OXYCODONE HCL 5 MG PO TABS
5.0000 mg | ORAL_TABLET | ORAL | Status: DC | PRN
  Administered 2014-09-19 – 2014-09-20 (×3): 5 mg via ORAL
  Filled 2014-09-19 (×3): qty 1

## 2014-09-19 NOTE — Progress Notes (Signed)
Family Medicine Teaching Service Daily Progress Note Intern Pager: [phone]  Patient name: [name] [name] Medical record number: [id number] Date of birth: [birth year] Age: 48 y.o. Gender: female  Primary Care Provider: Conni Slipper, MD Consultants: None Code Status: Full  Pt Overview and Major Events to Date:  9/22 - Admitted with nausea, vomiting, and abdominal pain  Assessment and Plan: [name] [name] is a 48 y.o. female presenting with nausea and vomiting . PMH is significant for cyclic vomiting syndrome, diabetes mellitus type 2, anxiety, acute pancreatitis, HTN, CKD.   #Nausea and vomiting: Improving. Similar to previous episodes but with more back pain/abdominal pain. Differential includes mild acute pancreatitis vs cyclic vomiting 2/2 marijuana use.   - Admit to telemetry, vitals per unit  - f/u Gastroccult (questionable hematemesis prior to admission) - Consider CT if not improving - ativan, zofran, phenergan, oxycodone prn - protonix - ADAT - IVF KVO  #AoCKD Stage 3-4. Baseline Cr around 2. Here 3.11 on admission. Likely prerenal in setting of dehydration secondary to vomiting - IVF KVO, encourage PO intake - limit nephrotoxic agents  #Hyperkalemia and Hypocalcemia. Potentially lab error given sudden change. No chest pain. No palpitations. No parestheias or tetany. Negative Chvostek sign - s/p 1 dose of kayexalate - f/u STAT BMP  # Diabetes mellitus, type 2: A1C here 7.2. Increased AG (22) on admission, though possibly due to lactic acidemia or ketones secondary to dehydrtation. CBGs here initially high (400s), however quickly responded to insulin, eventually hypoglycemic (31) - Continue home Lantus (20 U) as decreased dose of 10 units  - Sensitive SSI - Upon discharge, consider set up with Pharmacy clinic for better DM management.   #Hypertension: currently controlled  - Continue home carvedilol 25mg  BID  - Holding lisinopril/HCTZ in setting of dehydration  from nausea and emesis w/ concern for AKI. Can restart pending creatinine result. - Unsure of why Norvasc was stopped in past, but would highly consider restarting in her instead of HCTZ   # Normocytic Normochromic Anemia: HgB 12 here. Baseline of around 10-12. Likely secondary to CKD - Monitor CBC   #GERD  - Continue protonix  FEN/GI: Protonix, ADAT,  Prophylaxis: heparin subq   Disposition: Admitted to observation pending improvement of intractable vomiting. Potential discharge later today.  Subjective:  Continues to improve. Ready for solid foods this morning. Still has abdominal pain and low back pain.  Objective: Temp:  [97.4 F (36.3 C)-99.3 F (37.4 C)] 99.1 F (37.3 C) (09/24 0423) Pulse Rate:  [52-79] 59 (09/24 0423) Resp:  [18] 18 (09/24 0423) BP: (105-140)/(53-77) 111/53 mmHg (09/24 0423) SpO2:  [98 %-100 %] 98 % (09/24 0423) Weight:  [153 lb 3.5 oz (69.5 kg)] 153 lb 3.5 oz (69.5 kg) (09/23 2016) Physical Exam: General: Sitting in bed in no acute distress. Cardiovascular: RRR, no murmurs appreciated Respiratory: NWOB, CTAB Abdomen: +BS, soft, mildly tender to palpation in epigastric area nondistended Extremities: No edema or cyanosis  Laboratory:  Recent Labs Lab 09/17/14 1211 09/18/14 1123 09/19/14 0555  WBC 15.2* 9.2 6.7  HGB 12.0 10.0* 9.0*  HCT 33.7* 30.0* 26.1*  PLT 221 148* 154    Recent Labs Lab 09/17/14 1211 09/18/14 1123 09/19/14 0555  NA 140 141 140  K 3.7 3.8 6.3*  CL 94* 105 106  CO2 25 23 25   BUN 50* 38* 24*  CREATININE 3.11* 2.22* 2.03*  CALCIUM 10.3 8.2* 6.0*  PROT 8.3 6.8 5.9*  BILITOT 0.5 0.3 0.3  ALKPHOS 99 74 63  ALT 9 10 8   AST 16 19 14   GLUCOSE 518* 131* 88     Recent Labs Lab 09/18/14 1648 09/18/14 2013 09/19/14 0010 09/19/14 0408 09/19/14 0744  GLUCAP 168* 33 87 75 [place], MD 09/19/2014, 9:44 AM PGY-1, [place] Intern pager: [phone], text pages welcome

## 2014-09-19 NOTE — Discharge Instructions (Signed)
You were admitted to the hospital with nausea, vomiting, and abdominal and back pain. While here you were given fluids and anti-nausea medication. Your kidney numbers were initially high, but trended back to your baseline levels.   Nausea and Vomiting Nausea means you feel sick to your stomach. Throwing up (vomiting) is a reflex where stomach contents come out of your mouth. HOME CARE   Take medicine as told by your doctor.  Do not force yourself to eat. However, you do need to drink fluids.  If you feel like eating, eat a normal diet as told by your doctor.  Eat rice, wheat, potatoes, bread, lean meats, yogurt, fruits, and vegetables.  Avoid high-fat foods.  Drink enough fluids to keep your pee (urine) clear or pale yellow.  Ask your doctor how to replace body fluid losses (rehydrate). Signs of body fluid loss (dehydration) include:  Feeling very thirsty.  Dry lips and mouth.  Feeling dizzy.  Dark pee.  Peeing less than normal.  Feeling confused.  Fast breathing or heart rate. GET HELP RIGHT AWAY IF:   You have blood in your throw up.  You have black or bloody poop (stool).  You have a bad headache or stiff neck.  You feel confused.  You have bad belly (abdominal) pain.  You have chest pain or trouble breathing.  You do not pee at least once every 8 hours.  You have cold, clammy skin.  You keep throwing up after 24 to 48 hours.  You have a fever. MAKE SURE YOU:   Understand these instructions.  Will watch your condition.  Will get help right away if you are not doing well or get worse. Document Released: 05/31/2008 Document Revised: 03/06/2012 Document Reviewed: 05/14/2011 [facility] Patient Information 2015 [place], [place]. This information is not intended to replace advice given to you by your health care provider. Make sure you discuss any questions you have with your health care provider.

## 2014-09-19 NOTE — ED Provider Notes (Signed)
I saw and evaluated the patient, reviewed the resident's note and I agree with the findings and plan.   EKG Interpretation None      Pt well known to Dr Berkley Harvey. Presenting with vomiting. Cyclic vomiting vs gastroparesis vs cannabinoid emesis syndrome vs gastritis vs other. No distension to suggest obstruction. XR not suggestive. Symptoms improved. Labs close to baseline. I feel appropriate for DC.   Virgel Manifold, MD 09/19/14 [phone]

## 2014-09-19 NOTE — Progress Notes (Signed)
CRITICAL VALUE ALERT  Critical value received:  Ca 6.0  Date of notification:  09/19/14  Time of notification:  07:40  Critical value read back:Yes.    Nurse who received alert:  Virgilio Frees  MD notified (1st page):  Family Medicine Teaching Service  Time of first page:  07:41  Responding MD:  [place]  Time MD responded:  07:48

## 2014-09-19 NOTE — Progress Notes (Signed)
FMTS Attending Note Patient seen and examined by me, discussed with resident team and I agree with Dr Marigene Ehlers note for today. Patient reports marked improvement, is tolerating clears and wants to advance to solid food.  Plan to advance as tolerated. Plans for discharge once patient able to tolerate oral intake and not requiring IVF or IV meds. Outpatient Gastric Motility Disorder GI evaluation.  Dalbert Mayotte, MD

## 2014-09-20 DIAGNOSIS — R1115 Cyclical vomiting syndrome unrelated to migraine: Secondary | ICD-10-CM | POA: Diagnosis not present

## 2014-09-20 LAB — CBC
HEMATOCRIT: 27.9 % — AB (ref 36.0–46.0)
Hemoglobin: 9.6 g/dL — ABNORMAL LOW (ref 12.0–15.0)
MCH: 31.6 pg (ref 26.0–34.0)
MCHC: 34.4 g/dL (ref 30.0–36.0)
MCV: 91.8 fL (ref 78.0–100.0)
PLATELETS: 154 10*3/uL (ref 150–400)
RBC: 3.04 MIL/uL — ABNORMAL LOW (ref 3.87–5.11)
RDW: 12.8 % (ref 11.5–15.5)
WBC: 5.5 10*3/uL (ref 4.0–10.5)

## 2014-09-20 LAB — COMPREHENSIVE METABOLIC PANEL
ALBUMIN: 3.1 g/dL — AB (ref 3.5–5.2)
ALT: 7 U/L (ref 0–35)
ANION GAP: 8 (ref 5–15)
AST: 11 U/L (ref 0–37)
Alkaline Phosphatase: 60 U/L (ref 39–117)
BUN: 20 mg/dL (ref 6–23)
CO2: 25 mEq/L (ref 19–32)
Calcium: 8.8 mg/dL (ref 8.4–10.5)
Chloride: 105 mEq/L (ref 96–112)
Creatinine, Ser: 1.95 mg/dL — ABNORMAL HIGH (ref 0.50–1.10)
GFR calc Af Amer: 34 mL/min — ABNORMAL LOW (ref >90)
GFR, EST NON AFRICAN AMERICAN: 29 mL/min — AB (ref >90)
Glucose, Bld: 125 mg/dL — ABNORMAL HIGH (ref 70–99)
Potassium: 4.6 mEq/L (ref 3.7–5.3)
Sodium: 138 mEq/L (ref 137–147)
Total Bilirubin: 0.2 mg/dL — ABNORMAL LOW (ref 0.3–1.2)
Total Protein: 5.9 g/dL — ABNORMAL LOW (ref 6.0–8.3)

## 2014-09-20 LAB — GLUCOSE, CAPILLARY: Glucose-Capillary: 142 mg/dL — ABNORMAL HIGH (ref 70–99)

## 2014-09-20 NOTE — Discharge Summary (Signed)
FMTS Attending Note Patient's care discussed with resident team, I agree with Dr Melancon's note for today and plans for discharge of patient. Tolerating diet.  Dalbert Mayotte, MD

## 2014-09-23 ENCOUNTER — Ambulatory Visit (INDEPENDENT_AMBULATORY_CARE_PROVIDER_SITE_OTHER): Payer: Medicare Other | Admitting: Family Medicine

## 2014-09-23 ENCOUNTER — Encounter: Payer: Self-pay | Admitting: Family Medicine

## 2014-09-23 VITALS — BP 100/63 | HR 68 | Temp 98.7°F | Resp 16 | Wt 157.0 lb

## 2014-09-23 DIAGNOSIS — N184 Chronic kidney disease, stage 4 (severe): Secondary | ICD-10-CM

## 2014-09-23 MED ORDER — LORAZEPAM 1 MG PO TABS: 1.0000 mg | ORAL_TABLET | Freq: Two times a day (BID) | ORAL | Status: DC | PRN

## 2014-09-23 NOTE — Patient Instructions (Signed)
Stop hydrochlorothiazide, continue ativan at 2 tabs (1mg ) once as needed up to twice a day  Return to clinic in 2 weeks for repeat blood pressure check  Abstain from all drugs.

## 2014-09-23 NOTE — Progress Notes (Signed)
   Subjective:    Patient ID: MAILE BREVIK, female    DOB: 10/07/1966, 48 y.o.   MRN: [id number]  HPI  Past Medical History  Diagnosis Date  . PANCREATITIS 05/09/2008  . Cyclic vomiting syndrome   . Hypertension   . Complication of anesthesia     "problems waking up"  . Heart murmur   . Myocardial infarction 07/2007    "they say I've had a silent one; I don't know"  . Pneumonia   . Anemia   . Blood transfusion   . GERD (gastroesophageal reflux disease)   . Anxiety   . Depression   . Smoker   . Marijuana smoker, continuous   . NEUROPATHY, DIABETIC 02/23/2007  . Type II diabetes mellitus   . Diabetic gastroparesis     /e-chart  . Chronic kidney disease (CKD), stage III (moderate)    Cyclical vomiting syndrome s/p admission: no emesis, abdominal pain currently 4.5/10, nothing improves/worsens(although does feel ativan is helping), has been taking ativan and promethazine for pain.  Has not had any THC since discharge.  HTN: took coreg 25mg , hctz 25mg , lisinopril 40mg  today  DM: current regimen lantus 22u qHS, has not checked glucose since discharge  Review of Systems  Constitutional: Negative for chills and diaphoresis.  Respiratory: Negative for cough and shortness of breath.   Cardiovascular: Negative for leg swelling.  Gastrointestinal: Positive for nausea and abdominal pain. Negative for diarrhea, constipation and anal bleeding.  Endocrine: Negative for cold intolerance and heat intolerance.  Genitourinary: Negative for dysuria, urgency, decreased urine volume and difficulty urinating.  Neurological: Negative for headaches.       Objective:   Physical Exam  Constitutional: She appears well-developed and well-nourished. No distress.  HENT:  Head: Normocephalic and atraumatic.  Eyes: Conjunctivae are normal. Pupils are equal, round, and reactive to light.  Neck: Normal range of motion. Neck supple. No thyromegaly present.  Cardiovascular: Normal rate.     Pulmonary/Chest: Effort normal. No respiratory distress.  Abdominal: Soft. Bowel sounds are normal. She exhibits no distension. There is tenderness (all quadrants, moderate).  Musculoskeletal: She exhibits no edema.  Skin: Skin is warm and dry. No rash noted. She is not diaphoretic. No erythema.  Psychiatric: She has a normal mood and affect. Her behavior is normal.   BP 100/63  Pulse 68  Temp(Src) 98.7 F (37.1 C) (Oral)  Resp 16  Wt 157 lb (71.215 kg)  SpO2 100%   Images and labs reviewed  gastric emptying study 2012 - moderate delay     Assessment & Plan:  HTN: d/c HCTZ and recheck BP 2 weeks.  If elevated will restart goal BP 123XX123  Cyclical vomiting syndrome: no episodes of vomiting, hx of delayed gastric emptying.  Prescription for ativan appears to be wrong, written for 1.5mg  (0.5 tab x 3) prn BID, however rx written as 1.5tabs BID (total of 0.75mg ). ==> advised to take 2 tabs prn BID, given rx for ativan 1mg  BID prn #30 with 1 refill, this is to last for 2 months. => consider EGD when patient has colonoscopy for colon cancer screening  Patient advised that usage of THC along with benzos is not advised, if this persists will discontinue ativan.    Merla Riches, MD 3:54 PM

## 2014-09-27 ENCOUNTER — Encounter: Payer: Self-pay | Admitting: Family Medicine

## 2014-09-27 NOTE — Progress Notes (Signed)
Patient ID: [name] [name], female   DOB: [birth year], 48 y.o.   MRN: [id number]  Received documentation from [facility] that patient has not picked up her lisinopril by their records.  Will discuss with her at f/u.  Hilton Sinclair, MD

## 2014-10-23 ENCOUNTER — Other Ambulatory Visit: Payer: Self-pay | Admitting: Family Medicine

## 2014-11-06 ENCOUNTER — Ambulatory Visit (INDEPENDENT_AMBULATORY_CARE_PROVIDER_SITE_OTHER): Payer: Medicare Other | Admitting: Family Medicine

## 2014-11-06 ENCOUNTER — Encounter: Payer: Self-pay | Admitting: Family Medicine

## 2014-11-06 VITALS — BP 120/56 | HR 54 | Temp 98.1°F | Ht 61.0 in | Wt 158.5 lb

## 2014-11-06 DIAGNOSIS — R1115 Cyclical vomiting syndrome unrelated to migraine: Secondary | ICD-10-CM

## 2014-11-06 DIAGNOSIS — G43A1 Cyclical vomiting, intractable: Secondary | ICD-10-CM | POA: Diagnosis not present

## 2014-11-06 DIAGNOSIS — I10 Essential (primary) hypertension: Secondary | ICD-10-CM

## 2014-11-06 MED ORDER — PROMETHAZINE HCL 25 MG PO TABS: 25.0000 mg | ORAL_TABLET | Freq: Four times a day (QID) | ORAL | Status: DC | PRN

## 2014-11-06 MED ORDER — PROMETHAZINE HCL 25 MG RE SUPP: 25.0000 mg | Freq: Four times a day (QID) | RECTAL | Status: DC | PRN

## 2014-11-06 MED ORDER — OXYCODONE HCL 5 MG PO TABS: ORAL_TABLET | ORAL | Status: DC

## 2014-11-06 MED ORDER — LORAZEPAM 1 MG PO TABS: 1.0000 mg | ORAL_TABLET | Freq: Two times a day (BID) | ORAL | Status: DC | PRN

## 2014-11-08 NOTE — Assessment & Plan Note (Signed)
They stopped her HCTZ and her blood pressure today looks great so we will discontinue that. Follow-up greater provider in next 4 weeks.

## 2014-11-08 NOTE — Assessment & Plan Note (Signed)
Discussed at some length. I refilled her lorazepam, clarify dose. We discussed her marijuana intake. If she's going to smoke marijuana that needs to be stable amount on a daily basis without. Episodes  of not smoking because that seems to be what triggers her nausea. Ideally she would not smoke marijuana and I cannot tell her that it is medically necessary for her to smoke., But if she is going to smoke then stable dosing is what I would recommend.

## 2014-11-08 NOTE — Progress Notes (Signed)
   Subjective:    Patient ID: [name] [name], female    DOB: [birth year], 48 y.o.   MRN: [id number]  HPI Follow-up blood pressure after discontinuation of HCTZ. She is felt well. No dizziness, no chest pain, no shortness of breath. #2. Long history of cyclic vomiting. Has some questions about her lorazepam and Phenergan prescriptions. The lorazepam seems to work fairly well. She tries not to use the Phenergan unless she has to. If she uses lorazepam at the beginning of nausea than she is able to abort the attack usually 50% of the time.   Review of Systems No fever, sweats, chills, unusual weight change. See history of present illness above.    Objective:   Physical Exam  Vital signs reviewed. GENERAL: Well-developed, well-nourished, no acute distress. CARDIOVASCULAR: Regular rate and rhythm no murmur gallop or rub LUNGS: Clear to auscultation bilaterally, no rales or wheeze. ABDOMEN: Soft positive bowel sounds NEURO: No gross focal neurological deficits. MSK: Movement of extremity x 4.        Assessment & Plan:

## 2014-11-25 ENCOUNTER — Emergency Department (HOSPITAL_COMMUNITY)
Admission: EM | Admit: 2014-11-25 | Discharge: 2014-11-25 | Disposition: A | Discharge status: Home / Self Care | Payer: Medicare Other | Attending: Emergency Medicine | Admitting: Emergency Medicine

## 2014-11-25 ENCOUNTER — Encounter (HOSPITAL_COMMUNITY): Payer: Self-pay | Admitting: Emergency Medicine

## 2014-11-25 DIAGNOSIS — Z9889 Other specified postprocedural states: Secondary | ICD-10-CM | POA: Insufficient documentation

## 2014-11-25 DIAGNOSIS — Z8719 Personal history of other diseases of the digestive system: Secondary | ICD-10-CM | POA: Insufficient documentation

## 2014-11-25 DIAGNOSIS — N183 Chronic kidney disease, stage 3 (moderate): Secondary | ICD-10-CM | POA: Diagnosis not present

## 2014-11-25 DIAGNOSIS — Z794 Long term (current) use of insulin: Secondary | ICD-10-CM | POA: Diagnosis not present

## 2014-11-25 DIAGNOSIS — R1013 Epigastric pain: Secondary | ICD-10-CM | POA: Insufficient documentation

## 2014-11-25 DIAGNOSIS — F419 Anxiety disorder, unspecified: Secondary | ICD-10-CM | POA: Insufficient documentation

## 2014-11-25 DIAGNOSIS — E1140 Type 2 diabetes mellitus with diabetic neuropathy, unspecified: Secondary | ICD-10-CM | POA: Insufficient documentation

## 2014-11-25 DIAGNOSIS — Z79899 Other long term (current) drug therapy: Secondary | ICD-10-CM | POA: Diagnosis not present

## 2014-11-25 DIAGNOSIS — G43A0 Cyclical vomiting, not intractable: Secondary | ICD-10-CM | POA: Diagnosis not present

## 2014-11-25 DIAGNOSIS — R112 Nausea with vomiting, unspecified: Secondary | ICD-10-CM | POA: Diagnosis not present

## 2014-11-25 DIAGNOSIS — I129 Hypertensive chronic kidney disease with stage 1 through stage 4 chronic kidney disease, or unspecified chronic kidney disease: Secondary | ICD-10-CM | POA: Insufficient documentation

## 2014-11-25 DIAGNOSIS — E1143 Type 2 diabetes mellitus with diabetic autonomic (poly)neuropathy: Secondary | ICD-10-CM | POA: Diagnosis not present

## 2014-11-25 DIAGNOSIS — Z862 Personal history of diseases of the blood and blood-forming organs and certain disorders involving the immune mechanism: Secondary | ICD-10-CM | POA: Insufficient documentation

## 2014-11-25 DIAGNOSIS — Z8701 Personal history of pneumonia (recurrent): Secondary | ICD-10-CM | POA: Insufficient documentation

## 2014-11-25 DIAGNOSIS — R011 Cardiac murmur, unspecified: Secondary | ICD-10-CM | POA: Insufficient documentation

## 2014-11-25 DIAGNOSIS — I252 Old myocardial infarction: Secondary | ICD-10-CM | POA: Diagnosis not present

## 2014-11-25 DIAGNOSIS — Z720 Tobacco use: Secondary | ICD-10-CM | POA: Insufficient documentation

## 2014-11-25 DIAGNOSIS — Z791 Long term (current) use of non-steroidal anti-inflammatories (NSAID): Secondary | ICD-10-CM | POA: Insufficient documentation

## 2014-11-25 DIAGNOSIS — R1115 Cyclical vomiting syndrome unrelated to migraine: Secondary | ICD-10-CM

## 2014-11-25 LAB — CBC
HEMATOCRIT: 32 % — AB (ref 36.0–46.0)
HEMOGLOBIN: 10.8 g/dL — AB (ref 12.0–15.0)
MCH: 31.7 pg (ref 26.0–34.0)
MCHC: 33.8 g/dL (ref 30.0–36.0)
MCV: 93.8 fL (ref 78.0–100.0)
Platelets: 189 10*3/uL (ref 150–400)
RBC: 3.41 MIL/uL — ABNORMAL LOW (ref 3.87–5.11)
RDW: 13.2 % (ref 11.5–15.5)
WBC: 5.1 10*3/uL (ref 4.0–10.5)

## 2014-11-25 LAB — COMPREHENSIVE METABOLIC PANEL
ALT: 9 U/L (ref 0–35)
AST: 10 U/L (ref 0–37)
Albumin: 4 g/dL (ref 3.5–5.2)
Alkaline Phosphatase: 70 U/L (ref 39–117)
Anion gap: 12 (ref 5–15)
BUN: 32 mg/dL — AB (ref 6–23)
CALCIUM: 9.4 mg/dL (ref 8.4–10.5)
CO2: 22 mEq/L (ref 19–32)
CREATININE: 1.97 mg/dL — AB (ref 0.50–1.10)
Chloride: 105 mEq/L (ref 96–112)
GFR calc non Af Amer: 29 mL/min — ABNORMAL LOW (ref >90)
GFR, EST AFRICAN AMERICAN: 33 mL/min — AB (ref >90)
GLUCOSE: 284 mg/dL — AB (ref 70–99)
POTASSIUM: 5.6 meq/L — AB (ref 3.7–5.3)
Sodium: 139 mEq/L (ref 137–147)
Total Bilirubin: 0.2 mg/dL — ABNORMAL LOW (ref 0.3–1.2)
Total Protein: 7.2 g/dL (ref 6.0–8.3)

## 2014-11-25 LAB — LIPASE, BLOOD: LIPASE: 55 U/L (ref 11–59)

## 2014-11-25 MED ORDER — METOCLOPRAMIDE HCL 5 MG/ML IJ SOLN
10.0000 mg | Freq: Once | INTRAMUSCULAR | Status: AC
  Administered 2014-11-25: 10 mg via INTRAVENOUS
  Filled 2014-11-25: qty 2

## 2014-11-25 MED ORDER — SODIUM CHLORIDE 0.9 % IV BOLUS (SEPSIS)
1000.0000 mL | Freq: Once | INTRAVENOUS | Status: AC
  Administered 2014-11-25: 1000 mL via INTRAVENOUS

## 2014-11-25 MED ORDER — LORAZEPAM 2 MG/ML IJ SOLN
0.5000 mg | Freq: Once | INTRAMUSCULAR | Status: AC
  Administered 2014-11-25: 0.5 mg via INTRAVENOUS
  Filled 2014-11-25: qty 1

## 2014-11-25 MED ORDER — METOCLOPRAMIDE HCL 10 MG PO TABS: 10.0000 mg | ORAL_TABLET | Freq: Four times a day (QID) | ORAL | Status: DC | PRN

## 2014-11-25 MED ORDER — PANTOPRAZOLE SODIUM 40 MG IV SOLR
40.0000 mg | Freq: Once | INTRAVENOUS | Status: AC
  Administered 2014-11-25: 40 mg via INTRAVENOUS
  Filled 2014-11-25: qty 40

## 2014-11-25 MED ORDER — HYDROMORPHONE HCL 1 MG/ML IJ SOLN
1.0000 mg | Freq: Once | INTRAMUSCULAR | Status: AC
  Administered 2014-11-25: 1 mg via INTRAVENOUS
  Filled 2014-11-25: qty 1

## 2014-11-25 MED ORDER — ONDANSETRON HCL 4 MG/2ML IJ SOLN
4.0000 mg | Freq: Once | INTRAMUSCULAR | Status: AC
  Administered 2014-11-25: 4 mg via INTRAVENOUS
  Filled 2014-11-25: qty 2

## 2014-11-25 NOTE — ED Notes (Signed)
PT drank Ginger Ale and tol well.

## 2014-11-25 NOTE — ED Provider Notes (Signed)
CSN: [id number]     Arrival date & time 11/25/14  0617 History   First MD Initiated Contact with Patient 11/25/14 0701     Chief Complaint  Patient presents with  . Emesis     (Consider location/radiation/quality/duration/timing/severity/associated sxs/prior Treatment) Patient is a 48 y.o. female presenting with vomiting. The history is provided by the patient.  Emesis Associated symptoms: abdominal pain   Associated symptoms: no chills, no diarrhea, no headaches and no sore throat   pt with hx iddm, recurrent vomiting syndrome, presents w nausea and vomiting, onset last night. Several episodes. Emesis clear to color of recently ingested food/liquids, no bloody or bilious emesis. No diarrhea. Had bm yesterday/normal. Epigastric pain, constant, dull, mod/severe.  States symptoms are the same as with her prior recurrent vomiting syndrome. No back or flank pain. No dysuria or hematuria. No mid to lower abdominal or pelvic pain. No vaginal discharge or bleeding. No fever or chills.  Prior abd surgery is remote hx cholecystectomy and hysterectomy. No cp or sob. No cough or uri c/o. No headache.   Pt also notes was travelling over holiday weekend, and misplaced insulin, so last had her insulin 2 days ago.      Past Medical History  Diagnosis Date  . PANCREATITIS 05/09/2008  . Cyclic vomiting syndrome   . Hypertension   . Complication of anesthesia     "problems waking up"  . Heart murmur   . Myocardial infarction 07/2007    "they say I've had a silent one; I don't know"  . Pneumonia   . Anemia   . Blood transfusion   . GERD (gastroesophageal reflux disease)   . Anxiety   . Depression   . Smoker   . Marijuana smoker, continuous   . NEUROPATHY, DIABETIC 02/23/2007  . Type II diabetes mellitus   . Diabetic gastroparesis     /e-chart  . Chronic kidney disease (CKD), stage III (moderate)    Past Surgical History  Procedure Laterality Date  . Port-a-cath placement  ~ 2008    right  chest; "poor access; frequent hospitalizations"  . Laparotomy and lysis of adhesions    . Abdominal hysterectomy  02/2002  . Cholecystectomy  1980's  . Cardiac catheterization    . Dilation and evacuation  X3  . Left hand surgery    . Cataract extraction w/ intraocular lens  implant, bilateral    . Colonoscopy    . Upper gi endoscopy     Family History  Problem Relation Age of Onset  . Diabetes type II Mother   . Diabetes Mother   . Hypertension Mother   . Diabetes type II Sister   . Diabetes Sister   . Hypertension Sister   . Hypertension Brother    History  Substance Use Topics  . Smoking status: Current Some Day Smoker -- 0.50 packs/day for 22 years    Types: Cigarettes  . Smokeless tobacco: Never Used  . Alcohol Use: No   OB History    No data available     Review of Systems  Constitutional: Negative for fever and chills.  HENT: Negative for sore throat.   Eyes: Negative for visual disturbance.  Respiratory: Negative for cough and shortness of breath.   Cardiovascular: Negative for chest pain and leg swelling.  Gastrointestinal: Positive for nausea, vomiting and abdominal pain. Negative for diarrhea.  Endocrine: Negative for polydipsia and polyuria.  Genitourinary: Negative for flank pain.  Musculoskeletal: Negative for back pain and neck pain.  Skin: Negative for rash.  Neurological: Negative for weakness, numbness and headaches.  Hematological: Does not bruise/bleed easily.  Psychiatric/Behavioral: Negative for confusion.      Allergies  Bee venom; Acetaminophen; Novolog; and Erythromycin  Home Medications   Prior to Admission medications   Medication Sig Start Date End Date Taking? Authorizing Provider  carvedilol (COREG) 25 MG tablet Take 25 mg by mouth 2 (two) times daily with a meal.   Yes Historical Provider, MD  insulin glargine (LANTUS) 100 UNIT/ML injection Inject 20 Units into the skin daily.    Yes Historical Provider, MD  lisinopril  (PRINIVIL,ZESTRIL) 40 MG tablet Take 40 mg by mouth daily.   Yes Historical Provider, MD  LORazepam (ATIVAN) 1 MG tablet Take 1 tablet (1 mg total) by mouth 2 (two) times daily as needed for anxiety. 11/06/14  Yes Dickie La, MD  oxyCODONE (OXY IR/ROXICODONE) 5 MG immediate release tablet Take one tab by mouth up to twice a day as needed for shoulder pain 11/06/14  Yes Dickie La, MD  promethazine (PHENERGAN) 25 MG suppository Place 1 suppository (25 mg total) rectally every 6 (six) hours as needed for nausea or vomiting. 11/06/14  Yes Dickie La, MD  promethazine (PHENERGAN) 25 MG tablet Take 1 tablet (25 mg total) by mouth every 6 (six) hours as needed for nausea or vomiting. 11/06/14  Yes Dickie La, MD  SUMAtriptan (IMITREX) 50 MG tablet Take 50 mg by mouth every 2 (two) hours as needed for migraine. May repeat in 2 hours if headache persists or recurs.   Yes Historical Provider, MD  VOLTAREN 1 % GEL APPLY 2 GRAMS TOPICALLY 4 TIMES DAILY 10/23/14  Yes Hilton Sinclair, MD   BP 132/71 mmHg  Pulse 70  Temp(Src) 98.2 F (36.8 C) (Oral)  Resp 15  SpO2 100% Physical Exam  Constitutional: She is oriented to person, place, and time. She appears well-developed and well-nourished. No distress.  HENT:  Head: Atraumatic.  Mouth/Throat: Oropharynx is clear and moist.  Eyes: Conjunctivae are normal. Pupils are equal, round, and reactive to light. No scleral icterus.  Neck: Neck supple. No tracheal deviation present.  Cardiovascular: Normal rate, regular rhythm, normal heart sounds and intact distal pulses.   Pulmonary/Chest: Effort normal and breath sounds normal. No respiratory distress.  Abdominal: Soft. Normal appearance and bowel sounds are normal. She exhibits no distension and no mass. There is tenderness. There is no rebound and no guarding.  Epigastric tenderness, no rebound or guarding. No hernia.   Genitourinary:  No cva tenderness  Musculoskeletal: She exhibits no edema or  tenderness.  Neurological: She is alert and oriented to person, place, and time.  Skin: Skin is warm and dry. No rash noted. She is not diaphoretic.  Psychiatric:  Anxious, tearful.   Nursing note and vitals reviewed.   ED Course  Procedures (including critical care time) Labs Review  Results for orders placed or performed during the hospital encounter of 11/25/14  CBC  Result Value Ref Range   WBC 5.1 4.0 - 10.5 K/uL   RBC 3.41 (L) 3.87 - 5.11 MIL/uL   Hemoglobin 10.8 (L) 12.0 - 15.0 g/dL   HCT 32.0 (L) 36.0 - 46.0 %   MCV 93.8 78.0 - 100.0 fL   MCH 31.7 26.0 - 34.0 pg   MCHC 33.8 30.0 - 36.0 g/dL   RDW 13.2 11.5 - 15.5 %   Platelets 189 150 - 400 K/uL  Comprehensive metabolic panel  Result Value  Ref Range   Sodium 139 137 - 147 mEq/L   Potassium 5.6 (H) 3.7 - 5.3 mEq/L   Chloride 105 96 - 112 mEq/L   CO2 22 19 - 32 mEq/L   Glucose, Bld 284 (H) 70 - 99 mg/dL   BUN 32 (H) 6 - 23 mg/dL   Creatinine, Ser 1.97 (H) 0.50 - 1.10 mg/dL   Calcium 9.4 8.4 - 10.5 mg/dL   Total Protein 7.2 6.0 - 8.3 g/dL   Albumin 4.0 3.5 - 5.2 g/dL   AST 10 0 - 37 U/L   ALT 9 0 - 35 U/L   Alkaline Phosphatase 70 39 - 117 U/L   Total Bilirubin 0.2 (L) 0.3 - 1.2 mg/dL   GFR calc non Af Amer 29 (L) >90 mL/min   GFR calc Af Amer 33 (L) >90 mL/min   Anion gap 12 5 - 15  Lipase, blood  Result Value Ref Range   Lipase 55 11 - 59 U/L       MDM  Iv ns bolus.  Labs.  protonix iv.  reglan iv.  Dilaudid 1 mg iv for pain.  bs elevated at 284, hco3 normal. Additional ns boluses  - 2 liters total.  Cr c/w baseline.  Recheck tolerating po fluids. Feels much improved. abd soft nt.  Pt appears stable for d/c.   On reviewing prior charts, previous nm study w delayed gastric emptying, also w hx heavy thc use.  It appears pts best opportunity for long term symptom management includes cessation thc use, good control diabetes, minimizing narcotic med use, and symptomatic management w  antiemetics/reglan.   Pt currently appears stable for d/c.     Mirna Mires, MD 11/25/14 1055

## 2014-11-25 NOTE — ED Notes (Signed)
Patient has cyclic vomiting syndrome and woke up this morning vomiting and could not stop. Patient is reporting 8/10 epigastric pain.

## 2014-11-25 NOTE — Discharge Instructions (Signed)
It was our pleasure to provide your ER care today - we hope that you feel better.  Rest. Drink plenty of fluids.  It appears your best opportunity for long term improvement of recurrent vomiting syndrome would include minimizing narcotic medication use, avoiding THC/marijuana use, and maintaining good control of your diabetes.   In addition, you may also take reglan as need for symptom relief/improvement.    Follow up with your primary care doctor in the coming week for recheck.  Return to ER if worse, new symptoms, fevers, persistent vomiting, other concern.  From today's lab tests, your blood sugar is elevated (280's), and potassium level slightly elevated (5.6) - follow up with your doctor in coming week.  You were given medication in the ER that causes drowsiness, no driving for the next 4 hours.        Cyclic Vomiting Syndrome Cyclic vomiting syndrome is a benign condition in which patients experience bouts or cycles of severe nausea and vomiting that last for hours or even days. The bouts of nausea and vomiting alternate with longer periods of no symptoms and generally good health. Cyclic vomiting syndrome occurs mostly in children, but can affect adults. CAUSES  [facility] has no known cause. Each episode is typically similar to the previous ones. The episodes tend to:   Start at about the same time of day.  Last the same length of time.  Present the same symptoms at the same level of intensity. Cyclic vomiting syndrome can begin at any age in children and adults. Cyclic vomiting syndrome usually starts between the ages of 3 and 7 years. In adults, episodes tend to occur less often than they do in children, but they last longer. Furthermore, the events or situations that trigger episodes in adults cannot always be pinpointed as easily as they can in children. There are 4 phases of cyclic vomiting syndrome:  Prodrome. The prodrome phase signals that an episode of nausea and vomiting  is about to begin. This phase can last from just a few minutes to several hours. This phase is often marked by belly (abdominal) pain. Sometimes taking medicine early in the prodrome phase can stop an episode in progress. However, sometimes there is no warning. A person may simply wake up in the middle of the night or early morning and begin vomiting.  Episode. The episode phase consists of:  Severe vomiting.  Nausea.  Gagging (retching).  Recovery. The recovery phase begins when the nausea and vomiting stop. Healthy color, appetite, and energy return.  Symptom-free interval. The symptom-free interval phase is the period between episodes when no symptoms are present. TRIGGERS Episodes can be triggered by an infection or event. Examples of triggers include:  Infections.  Colds, allergies, sinus problems, and the flu.  Eating certain foods such as chocolate or cheese.  Foods with monosodium glutamate (MSG) or preservatives.  Fast foods.  Pre-packaged foods.  Foods with low nutritional value (junk foods).  Overeating.  Eating just before going to bed.  Hot weather.  Dehydration.  Not enough sleep or poor sleep quality.  Physical exhaustion.  Menstruation.  Motion sickness.  Emotional stress (school or home difficulties).  Excitement or stress. SYMPTOMS  The main symptoms of cyclic vomiting syndrome are:  Severe vomiting.  Nausea.  Gagging (retching). Episodes usually begin at night or the first thing in the morning. Episodes may include vomiting or retching up to 5 or 6 times an hour during the worst of the episode. Episodes usually last anywhere  from 1 to 4 days. Episodes can last for up to 10 days. Other symptoms include:  Paleness.  Exhaustion.  Listlessness.  Abdominal pain.  Loose stools or diarrhea. Sometimes the nausea and vomiting are so severe that a person appears to be almost unconscious. Sensitivity to light, headache, fever, dizziness, may  also accompany an episode. In addition, the vomiting may cause drooling and excessive thirst. Drinking water usually leads to more vomiting, though the water can dilute the acid in the vomit, making the episode a little less painful. Continuous vomiting can lead to dehydration, which means that the body has lost excessive water and salts. DIAGNOSIS  Cyclic vomiting syndrome is hard to diagnose because there are no clear tests to identify it. A caregiver must diagnose cyclic vomiting syndrome by looking at symptoms and medical history. A caregiver must exclude more common diseases or disorders that can also cause nausea and vomiting. Also, diagnosis takes time because caregivers need to identify a pattern or cycle to the vomiting. TREATMENT  Cyclic vomiting syndrome cannot be cured. Treatment varies, but people with cyclic vomiting syndrome should get plenty of rest and sleep and take medications that prevent, stop, or lessen the vomiting episodes and other symptoms. People whose episodes are frequent and long-lasting may be treated during the symptom-free intervals in an effort to prevent or ease future episodes. The symptom-free phase is a good time to eliminate anything known to trigger an episode. For example, if episodes are brought on by stress or excitement, this period is the time to find ways to reduce stress and stay calm. If sinus problems or allergies cause episodes, those conditions should be treated. The triggers listed above should be avoided or prevented. Because of the similarities between migraine and cyclic vomiting syndrome, caregivers treat some people with severe cyclic vomiting syndrome with drugs that are also used for migraine headaches. The drugs are designed to:  Prevent episodes.  Reduce their frequency.  Lessen their severity. HOME CARE INSTRUCTIONS Once a vomiting episode begins, treatment is supportive. It helps to stay in bed and sleep in a dark, quiet room. Severe nausea  and vomiting may require hospitalization and intravenous (IV) fluids to prevent dehydration. Relaxing medications (sedatives) may help if the nausea continues. Sometimes, during the prodrome phase, it is possible to stop an episode from happening altogether. Only take over-the-counter or prescription medicines for pain, discomfort or fever as directed by your caregiver. Do not give aspirin to children. During the recovery phase, drinking water and replacing lost electrolytes (salts in the blood) are very important. Electrolytes are salts that the body needs to function well and stay healthy. Symptoms during the recovery phase can vary. Some people find that their appetites return to normal immediately, while others need to begin by drinking clear liquids and then move slowly to solid food. RELATED COMPLICATIONS The severe vomiting that defines cyclic vomiting syndrome is a risk factor for several complications:  Dehydration--Vomiting causes the body to lose water quickly.  Electrolyte imbalance--Vomiting also causes the body to lose the important salts it needs to keep working properly.  Peptic esophagitis--The tube that connects the mouth to the stomach (esophagus) becomes injured from the stomach acid that comes up with the vomit.  Hematemesis--The esophagus becomes irritated and bleeds, so blood mixes with the vomit.  Mallory-Weiss tear--The lower end of the esophagus may tear open or the stomach may bruise from vomiting or retching.  Tooth decay--The acid in the vomit can hurt the teeth by corroding  the tooth enamel. SEEK MEDICAL CARE IF: You have questions or problems. Document Released: 02/21/2002 Document Revised: 03/06/2012 Document Reviewed: 03/22/2011 [facility] Patient Information 2015 [place], [place]. This information is not intended to replace advice given to you by your health care provider. Make sure you discuss any questions you have with your health care  provider.      Gastroparesis  Gastroparesis is also called slowed stomach emptying (delayed gastric emptying). It is a condition in which the stomach takes too long to empty its contents. It often happens in people with diabetes.  CAUSES  Gastroparesis happens when nerves to the stomach are damaged or stop working. When the nerves are damaged, the muscles of the stomach and intestines do not work normally. The movement of food is slowed or stopped. High blood glucose (sugar) causes changes in nerves and can damage the blood vessels that carry oxygen and nutrients to the nerves. RISK FACTORS  Diabetes.  Post-viral syndromes.  Eating disorders (anorexia, bulimia).  Surgery on the stomach or vagus nerve.  Gastroesophageal reflux disease (rarely).  Smooth muscle disorders (amyloidosis, scleroderma).  Metabolic disorders, including hypothyroidism.  Parkinson disease. SYMPTOMS   Heartburn.  Feeling sick to your stomach (nausea).  Vomiting of undigested food.  An early feeling of fullness when eating.  Weight loss.  Abdominal bloating.  Erratic blood glucose levels.  Lack of appetite.  Gastroesophageal reflux.  Spasms of the stomach wall. Complications can include:  Bacterial overgrowth in stomach. Food stays in the stomach and can ferment and cause bacteria to grow.  Weight loss due to difficulty digesting and absorbing nutrients.  Vomiting.  Obstruction in the stomach. Undigested food can harden and cause nausea and vomiting.  Blood glucose fluctuations caused by inconsistent food absorption. DIAGNOSIS  The diagnosis of gastroparesis is confirmed through one or more of the following tests:  Barium X-rays and scans. These tests look at how long it takes for food to move through the stomach.  Gastric manometry. This test measures electrical and muscular activity in the stomach. A thin tube is passed down the throat into the stomach. The tube contains a wire  that takes measurements of the stomach's electrical and muscular activity as it digests liquids and solid food.  Endoscopy. This procedure is done with a long, thin tube called an endoscope. It is passed through the mouth and gently down the esophagus into the stomach. This tube helps the caregiver look at the lining of the stomach to check for any abnormalities.  Ultrasonography. This can rule out gallbladder disease or pancreatitis. This test will outline and define the shape of the gallbladder and pancreas. TREATMENT   Treatments may include:  Exercise.  Medicines to control nausea and vomiting.  Medicines to stimulate stomach muscles.  Changes in what and when you eat.  Having smaller meals more often.  Eating low-fiber forms of high-fiber foods, such as eating cooked vegetables instead of raw vegetables.  Eating low-fat foods.  Consuming liquids, which are easier to digest.  In severe cases, feeding tubes and intravenous (IV) feeding may be needed. It is important to note that in most cases, treatment does not cure gastroparesis. It is usually a lasting (chronic) condition. Treatment helps you manage the underlying condition so that you can be as healthy and comfortable as possible. Other treatments  A gastric neurostimulator has been developed to assist people with gastroparesis. The battery-operated device is surgically implanted. It emits mild electrical pulses to help improve stomach emptying and to control nausea  and vomiting.  The use of botulinum toxin has been shown to improve stomach emptying by decreasing the prolonged contractions of the muscle between the stomach and the small intestine (pyloric sphincter). The benefits are temporary. SEEK MEDICAL CARE IF:   You have diabetes and you are having problems keeping your blood glucose in goal range.  You are having nausea, vomiting, bloating, or early feelings of fullness with eating.  Your symptoms do not change  with a change in diet. Document Released: 12/13/2005 Document Revised: 04/09/2013 Document Reviewed: 05/22/2009 [facility] Patient Information 2015 [place], [place]. This information is not intended to replace advice given to you by your health care provider. Make sure you discuss any questions you have with your health care provider.     Cannabis Use Disorder Cannabis use disorder is a mental disorder. It is not one-time or occasional use of cannabis, more commonly known as marijuana. Cannabis use disorder is the continued, nonmedical use of cannabis that interferes with normal life activities or causes health problems. People with cannabis use disorder get a feeling of extreme pleasure and relaxation from cannabis use. This "high" is very rewarding and causes people to use over and over.  The mind-altering ingredient in cannabis is know as THC. THC can also interfere with motor coordination, memory, judgment, and accurate sense of space and time. These effects can last for a few days after using cannabis. Regular heavy cannabis use can cause long-lasting problems with thinking and learning. In young people, these problems may be permanent. Cannabis sometimes causes severe anxiety, paranoia, or visual hallucinations. Man-made (synthetic) cannabis-like drugs, such as "spice" and "K2," cause the same effects as THC but are much stronger. Cannabis-like drugs can cause dangerously high blood pressure and heart rate.  Cannabis use disorder usually starts in the teenage years. It can trigger the development of schizophrenia. It is somewhat more common in men than women. People who have family members with the disorder or existing mental health issues such as depression and posttraumatic stress disorderare more likely to develop cannabis use disorder. People with cannabis use disorder are at higher risk for use of other drugs of abuse.  SIGNS AND SYMPTOMS Signs and symptoms of cannabis use disorder include:    Use of cannabis in larger amounts or over a longer period than intended.   Unsuccessful attempts to cut down or control cannabis use.   A lot of time spent obtaining, using, or recovering from the effects of cannabis.   A strong desire or urge to use cannabis (cravings).   Continued use of cannabis in spite of problems at work, school, or home because of use.   Continued use of cannabis in spite of relationship problems because of use.  Giving up or cutting down on important life activities because of cannabis use.  Use of cannabis over and over even in situations when it is physically hazardous, such as when driving a car.   Continued use of cannabis in spite of a physical problem that is likely related to use. Physical problems can include:  Chronic cough.  Bronchitis.  Emphysema.  Throat and lung cancer.  Continued use of cannabis in spite of a mental problem that is likely related to use. Mental problems can include:  Psychosis.  Anxiety.  Difficulty sleeping.  Need to use more and more cannabis to get the same effect, or lessened effect over time with use of the same amount (tolerance).  Having withdrawal symptoms when cannabis use is stopped, or using  cannabis to reduce or avoid withdrawal symptoms. Withdrawal symptoms include:  Irritability or anger.  Anxiety or restlessness.  Difficulty sleeping.  Loss of appetite or weight.  Aches and pains.  Shakiness.  Sweating.  Chills. DIAGNOSIS Cannabis use disorder is diagnosed by your health care provider. You may be asked questions about your cannabis use and how it affects your life. A physical exam may be done. A drug screen may be done. You may be referred to a mental health professional. The diagnosis of cannabis use disorder requires at least two symptoms within 12 months. The type of cannabis use disorder you have depends on the number of symptoms you have. The type may be:  Mild. Two or three  signs and symptoms.   Moderate. Four or five signs and symptoms.   Severe. Six or more signs and symptoms.  TREATMENT Treatment is usually provided by mental health professionals with training in substance use disorders. The following options are available:  Counseling or talk therapy. Talk therapy addresses the reasons you use cannabis. It also addresses ways to keep you from using again. The goals of talk therapy include:  Identifying and avoiding triggers for use.  Learning how to handle cravings.  Replacing use with healthy activities.  Support groups. Support groups provide emotional support, advice, and guidance.  Medicine. Medicine is used to treat mental health issues that trigger cannabis use or that result from it. HOME CARE INSTRUCTIONS  Take medicines only as directed by your health care provider.  Check with your health care provider before starting any new medicines.  Keep all follow-up visits as directed by your health care provider. SEEK MEDICAL CARE IF:  You are not able to take your medicines as directed.  Your symptoms get worse. SEEK IMMEDIATE MEDICAL CARE IF: You have serious thoughts about hurting yourself or others. [place] on Drug Abuse: motorcyclefax.[email or web address]  Substance Abuse and Mental Health Services Administration: [email or web address].[email or web address] Document Released: 12/10/2000 Document Revised: 04/29/2014 Document Reviewed: 12/26/2013 [facility] Patient Information 2015 [place], [place]. This information is not intended to replace advice given to you by your health care provider. Make sure you discuss any questions you have with your health care provider.     Diabetes Mellitus and Food It is important for you to manage your blood sugar (glucose) level. Your blood glucose level can be greatly affected by what you eat. Eating healthier foods in the appropriate amounts throughout the day at about the same time each day will help you  control your blood glucose level. It can also help slow or prevent worsening of your diabetes mellitus. Healthy eating may even help you improve the level of your blood pressure and reach or maintain a healthy weight.  HOW CAN FOOD AFFECT ME? Carbohydrates Carbohydrates affect your blood glucose level more than any other type of food. Your dietitian will help you determine how many carbohydrates to eat at each meal and teach you how to count carbohydrates. Counting carbohydrates is important to keep your blood glucose at a healthy level, especially if you are using insulin or taking certain medicines for diabetes mellitus. Alcohol Alcohol can cause sudden decreases in blood glucose (hypoglycemia), especially if you use insulin or take certain medicines for diabetes mellitus. Hypoglycemia can be a life-threatening condition. Symptoms of hypoglycemia (sleepiness, dizziness, and disorientation) are similar to symptoms of having too much alcohol.  If your health care provider has given you approval to drink alcohol, do so in moderation and use the  following guidelines:  Women should not have more than one drink per day, and men should not have more than two drinks per day. One drink is equal to:  12 oz of beer.  5 oz of wine.  1 oz of hard liquor.  Do not drink on an empty stomach.  Keep yourself hydrated. Have water, diet soda, or unsweetened iced tea.  Regular soda, juice, and other mixers might contain a lot of carbohydrates and should be counted. WHAT FOODS ARE NOT RECOMMENDED? As you make food choices, it is important to remember that all foods are not the same. Some foods have fewer nutrients per serving than other foods, even though they might have the same number of calories or carbohydrates. It is difficult to get your body what it needs when you eat foods with fewer nutrients. Examples of foods that you should avoid that are high in calories and carbohydrates but low in nutrients  include:  Trans fats (most processed foods list trans fats on the Nutrition Facts label).  Regular soda.  Juice.  Candy.  Sweets, such as cake, pie, doughnuts, and cookies.  Fried foods. WHAT FOODS CAN I EAT? Have nutrient-rich foods, which will nourish your body and keep you healthy. The food you should eat also will depend on several factors, including:  The calories you need.  The medicines you take.  Your weight.  Your blood glucose level.  Your blood pressure level.  Your cholesterol level. You also should eat a variety of foods, including:  Protein, such as meat, poultry, fish, tofu, nuts, and seeds (lean animal proteins are best).  Fruits.  Vegetables.  Dairy products, such as milk, cheese, and yogurt (low fat is best).  Breads, grains, pasta, cereal, rice, and beans.  Fats such as olive oil, trans fat-free margarine, canola oil, avocado, and olives. DOES EVERYONE WITH DIABETES MELLITUS HAVE THE SAME MEAL PLAN? Because every person with diabetes mellitus is different, there is not one meal plan that works for everyone. It is very important that you meet with a dietitian who will help you create a meal plan that is just right for you. Document Released: 09/09/2005 Document Revised: 12/18/2013 Document Reviewed: 11/09/2013 [facility] Patient Information 2015 [place], [place]. This information is not intended to replace advice given to you by your health care provider. Make sure you discuss any questions you have with your health care provider.

## 2014-11-27 ENCOUNTER — Encounter: Payer: Self-pay | Admitting: Family Medicine

## 2014-11-27 ENCOUNTER — Ambulatory Visit (INDEPENDENT_AMBULATORY_CARE_PROVIDER_SITE_OTHER): Payer: Medicare Other | Admitting: Family Medicine

## 2014-11-27 VITALS — BP 134/70 | HR 85 | Ht 61.0 in | Wt 157.0 lb

## 2014-11-27 DIAGNOSIS — E1322 Other specified diabetes mellitus with diabetic chronic kidney disease: Secondary | ICD-10-CM

## 2014-11-27 DIAGNOSIS — N189 Chronic kidney disease, unspecified: Secondary | ICD-10-CM

## 2014-11-27 DIAGNOSIS — N898 Other specified noninflammatory disorders of vagina: Secondary | ICD-10-CM

## 2014-11-27 DIAGNOSIS — G43A1 Cyclical vomiting, intractable: Secondary | ICD-10-CM

## 2014-11-27 DIAGNOSIS — N183 Chronic kidney disease, stage 3 (moderate): Secondary | ICD-10-CM

## 2014-11-27 DIAGNOSIS — E1329 Other specified diabetes mellitus with other diabetic kidney complication: Secondary | ICD-10-CM

## 2014-11-27 DIAGNOSIS — I10 Essential (primary) hypertension: Secondary | ICD-10-CM

## 2014-11-27 DIAGNOSIS — IMO0002 Reserved for concepts with insufficient information to code with codable children: Secondary | ICD-10-CM

## 2014-11-27 DIAGNOSIS — E1365 Other specified diabetes mellitus with hyperglycemia: Secondary | ICD-10-CM

## 2014-11-27 DIAGNOSIS — R1115 Cyclical vomiting syndrome unrelated to migraine: Secondary | ICD-10-CM

## 2014-11-27 MED ORDER — METRONIDAZOLE 0.75 % EX GEL: 1.0000 "application " | Freq: Two times a day (BID) | CUTANEOUS | Status: DC

## 2014-11-27 NOTE — Patient Instructions (Signed)
We are getting labs today. I will call you if any are NOT normal. I will send you the result of your vitamin D in a letter as well.  Come back in early January for follow up of your cyclic vomiting before running out of ativan and Diabetes.  Keep working on quitting cigarettes or marijuana or both!  Take flagyl gel for 5 days for vaginal discharge.  Seek reevaluation if you have pain or burning or fevers.  Take diabetes medicines daily without missing doses. Also be careful about the amount of carbohydrates in a day. Here are suggestions:  Diet Recommendations for Diabetes   Starchy (carb) foods include: Bread, rice, pasta, potatoes, corn, crackers, bagels, muffins, all baked goods.  (Fruits, milk, and yogurt also have carbohydrate, but most of these foods will not spike your blood sugar as the starchy foods will.)  A few fruits do cause high blood sugars; use small portions of bananas (limit to 1/2 at a time), grapes, and most tropical fruits.    Protein foods include: Meat, fish, poultry, eggs, dairy foods, and beans such as pinto and kidney beans (beans also provide carbohydrate).   1. Eat at least 3 meals and 1-2 snacks per day. Never go more than 4-5 hours while awake without eating.  2. Limit starchy foods to TWO per meal and ONE per snack. ONE portion of a starchy  food is equal to the following:   - ONE slice of bread (or its equivalent, such as half of a hamburger bun).   - 1/2 cup of a "scoopable" starchy food such as potatoes or rice.   - 15 grams of carbohydrate as shown on food label.  3. Both lunch and dinner should include a protein food, a carb food, and vegetables.   - Obtain twice as many veg's as protein or carbohydrate foods for both lunch and dinner.   - Fresh or frozen veg's are best.   - Try to keep frozen veg's on hand for a quick vegetable serving.    4. Breakfast should always include protein.    Best,  Hilton Sinclair, MD

## 2014-11-27 NOTE — Assessment & Plan Note (Signed)
Symptoms improved from recent flare with mild epigastric pain persistent.Still heavy THC use and smoking cigarettes. - Discussed quitting cigarettes as a possible method to improve symptopms. - Also reiterated that marijuana can actually worsen / cause cyclic vomiting. - F/u 1 month prior to running out of ativan. - Take diabetes medications daily to keep good control of blood sugar, and stick to healthy diet (reviewed).

## 2014-11-27 NOTE — Assessment & Plan Note (Signed)
Malodorous bothersome discharge with no pain or itching for 2 weeks. Deferring testing due to symptoms most consistent with BV and shortstaffed on lab personnel. - Flagyl gel x 5 days. - Stick with prior soap that did not bother her. - Return precautions reviewed.

## 2014-11-27 NOTE — Assessment & Plan Note (Signed)
BP well controlled off HCTZ, mildly elevated today in clinic but likely lower at home. - Check 3 values at home and call to assure good control at home. - Creatinine at recent check stable. - F/u with renal mid-month

## 2014-11-27 NOTE — Assessment & Plan Note (Signed)
-   Pt requested Vit D due to h/o CKD. Checking today.

## 2014-11-27 NOTE — Progress Notes (Signed)
Patient ID: [name] [name], female   DOB: 12-23-1966, 48 y.o.   MRN: [id number] Subjective:   CC: Follow up  HPI:   Cyclic vomiting Last appointment, discussed at length, refilled ativan 11/11 30 tabs with 1 refill, discussed that marijuana intake as well as inconsistent use could trigger cyclic vomiting. Recommended stable dosing if she was to smoke marijuana. Seen again 11/30 for flare in ED, given PPI and reglan, dilaudid, and pt felt better and was discharged. Woke up sick and took medicine, it dissolved and did not help symptoms. Didn't smoke usual amount of marijuana which is what she thinks triggered it. Previous NM study with delayed gastric emptying. Discussed THC cessation, DM control, minimizing narcotics, and using antiemetics and reglan.   Feeling better today, not in too much pain. Able to cope with it today. Some epigastric pain today. She attributes her last episode (sick 21 days) to NOT using marijuana. Smoking 1 pack cigarettes in 2-3 days. Was down to 3 cig/day. Mild nausea today. No fevers/chills.  HTN Stopped HCTZ at admission and BP 11/11 was well controlled. Discontinued at that time. Has manual cuff at home but has not checked BP. Denies chest pain/dyspnea. No leg swelling or vision changes.  Vaginal discharge  Without itch or pain. Mild "different" odor. Not bothering her physically but is annoying. Did recently change soaps. Went back to her usual now. Present 2 weeks.  Review of Systems - Per HPI.   PMH - Anxiety, cyclic vomiting, THC abuse, CKD, HTN, DM, epigastric abdominal pain, allergic rhinitis, anemia, chronic back pain, MDD, GERD, esophagitis, high risk social situation, migraines, pancreatitis, tobacco dependence, HLD,  Smoking status: 3 cigs daily    Objective:  Physical Exam BP 134/70 mmHg  Pulse 85  Ht 5\' 1"  (1.549 m)  Wt 157 lb (71.215 kg)  BMI 29.68 kg/m2 GEN: NAD EXTR: No LE edema CV: RRR, 2+ B radial pulse PULM: CTAB, normal effort PSYCH:  mood and affect euthymic today; PHQ-9: 10 with no SI/HI    Assessment:     [name] [name] is a 48 y.o. female here for follow up of cyclic vomiting ED visit, HTN, and with vaginal discharge.    Plan:     # See problem list and after visit summary for problem-specific plans.  # Health Maintenance: Discussed smoking cessation.  Follow-up: Follow up in 1 month for f/u of cyclic vomiting and diabetes (due for A1c at that time). - Return in the future to discuss cramping in hands and feet.  Hilton Sinclair, MD [place]

## 2014-11-28 ENCOUNTER — Telehealth: Payer: Self-pay | Admitting: Family Medicine

## 2014-11-28 ENCOUNTER — Encounter: Payer: Self-pay | Admitting: Family Medicine

## 2014-11-28 ENCOUNTER — Other Ambulatory Visit: Payer: Self-pay | Admitting: Family Medicine

## 2014-11-28 DIAGNOSIS — E559 Vitamin D deficiency, unspecified: Secondary | ICD-10-CM | POA: Insufficient documentation

## 2014-11-28 HISTORY — DX: Vitamin D deficiency, unspecified: E55.9

## 2014-11-28 LAB — VITAMIN D 25 HYDROXY (VIT D DEFICIENCY, FRACTURES): Vit D, 25-Hydroxy: 12 ng/mL — ABNORMAL LOW (ref 30–100)

## 2014-11-28 MED ORDER — VITAMIN D (ERGOCALCIFEROL) 1.25 MG (50000 UNIT) PO CAPS: 50000.0000 [IU] | ORAL_CAPSULE | ORAL | Status: DC

## 2014-11-28 NOTE — Telephone Encounter (Signed)
Please call Ms Kellerman to let her know her vitamin D level was low (12 ng/mL, goal is >20) so we will begin to supplement her with weekly oral vitamin D and recheck in 8 weeks. I will send this to her pharmacy. I will also mail her the result along with her recent other labs so she can take this to her kidney doctor mid-month. Thanks, Hilton Sinclair, MD

## 2014-11-28 NOTE — Telephone Encounter (Signed)
LM for patient to call back.  Please inform of message from MD regarding labs.  Thanks [employer]

## 2014-12-12 ENCOUNTER — Other Ambulatory Visit: Payer: Self-pay | Admitting: Family Medicine

## 2014-12-12 MED ORDER — OXYCODONE HCL 5 MG PO TABS: ORAL_TABLET | ORAL | Status: DC

## 2014-12-12 MED ORDER — LORAZEPAM 1 MG PO TABS: 1.0000 mg | ORAL_TABLET | Freq: Two times a day (BID) | ORAL | Status: DC | PRN

## 2014-12-12 NOTE — Telephone Encounter (Signed)
Pt is aware and appt made for 01/06/14. Kosta Schnitzler,CMA

## 2014-12-12 NOTE — Telephone Encounter (Signed)
Filling oxycodone and ativan for 1 month (will leave up front for her to pick up) but please let patient know that we typically do not do these refills over the phone and she needs to come in for dedicated visit for pain management with me within the next month prior to more refills. Hilton Sinclair, MD

## 2014-12-12 NOTE — Telephone Encounter (Signed)
Pt called and would like refill's on her Lorazepam and Oxycodone. jw

## 2015-01-06 ENCOUNTER — Encounter: Payer: Self-pay | Admitting: Family Medicine

## 2015-01-06 ENCOUNTER — Ambulatory Visit (INDEPENDENT_AMBULATORY_CARE_PROVIDER_SITE_OTHER): Payer: Medicare Other | Admitting: Family Medicine

## 2015-01-06 VITALS — BP 177/79 | HR 59 | Temp 98.2°F | Ht 61.0 in | Wt 158.0 lb

## 2015-01-06 DIAGNOSIS — M5489 Other dorsalgia: Secondary | ICD-10-CM | POA: Diagnosis not present

## 2015-01-06 DIAGNOSIS — R1115 Cyclical vomiting syndrome unrelated to migraine: Secondary | ICD-10-CM

## 2015-01-06 DIAGNOSIS — Z7189 Other specified counseling: Secondary | ICD-10-CM | POA: Diagnosis not present

## 2015-01-06 DIAGNOSIS — Z5181 Encounter for therapeutic drug level monitoring: Secondary | ICD-10-CM | POA: Insufficient documentation

## 2015-01-06 DIAGNOSIS — G43A0 Cyclical vomiting, not intractable: Secondary | ICD-10-CM

## 2015-01-06 DIAGNOSIS — G8929 Other chronic pain: Secondary | ICD-10-CM

## 2015-01-06 DIAGNOSIS — M75101 Unspecified rotator cuff tear or rupture of right shoulder, not specified as traumatic: Secondary | ICD-10-CM

## 2015-01-06 DIAGNOSIS — Z79891 Long term (current) use of opiate analgesic: Secondary | ICD-10-CM

## 2015-01-06 HISTORY — DX: Other chronic pain: G89.29

## 2015-01-06 MED ORDER — PREGABALIN 25 MG PO CAPS: 25.0000 mg | ORAL_CAPSULE | Freq: Every evening | ORAL | Status: DC | PRN

## 2015-01-06 MED ORDER — OXYCODONE HCL 5 MG PO TABS: ORAL_TABLET | ORAL | Status: DC

## 2015-01-06 MED ORDER — LORAZEPAM 1 MG PO TABS: 1.0000 mg | ORAL_TABLET | Freq: Two times a day (BID) | ORAL | Status: DC | PRN

## 2015-01-06 MED ORDER — DICLOFENAC SODIUM 1 % TD GEL
TRANSDERMAL | Status: DC
Start: 2015-01-06 — End: 2015-02-03

## 2015-01-06 NOTE — Assessment & Plan Note (Addendum)
On oxycodone for various indications making managing its use very difficult. Was started years ago for cyclic vomiting, now uses intermittently for back pain and shoulder pain. - Reviewed narcotic database. - Due for UDS 04/2015 and review of pain contract 12/2015. UDS in the past pos which we have discussed at length. - Oxycodone filled x 2 months, 15 tab per mo. Reviewed pain contract today. - F/u 2 months.

## 2015-01-06 NOTE — Patient Instructions (Signed)
Good to see you. I have placed the referral to PT. Call in 2 weeks if you have not been called by them. This is the most important part of treatment. Follow up for pain in 2 months. If you need an injection in Feb, you can make an appointment for this or we can do one when you come back in 2 months. Continue voltaren gel. Take lyrica 1 tab just at bedtime to see if this helps with night pain. Do not take more than this because of your kidney function. Use your heating pad.  I have filled 1 month of ativan. Follow up within the month for a visit just to discuss this.  Follow up when convenient to discuss just diabetes and hypertension.  Best,  Hilton Sinclair, MD

## 2015-01-06 NOTE — Progress Notes (Signed)
Patient ID: [name] [name], female   DOB: October 08, 1966, 49 y.o.   MRN: [id number] Subjective:   CC: Right shoulder pain  HPI:   This is a sameday visit for   Right shoulder pain Patient has had right shoulder pain for the past 3 years that has been flared since July 2015 making it difficult for her to lift things, bathe self, sleep, and change clothes. She has received steroid shots that have helped for 2-4 weeks at a time. Oxycodone also helps. At [facility] visit 06/2014, this was thought to be due to rotator cuff syndrome. She does some of the rubber band exercises at home but has not seen formal PT. Pain worse at night making it difficult to sleep. Heat helps. Radiates to neck.   Chronic pain medication  She has been on oxycodone chronically due to cyclic vomiting syndrome that she states helps. She has recently also been using it for her right shoulder pain and back pain.  Review of Systems - Per HPI.   PMH - anxiety, cyclic vomiting, HTN, DM type II with CKD III, depression, migraine, GERD, insomnia, chronic back pain, marijuana use, high risk social situation, diabetic nerupathy, itamin D deficiency Smoking status: Current every day smoker    Objective:  Physical Exam BP 177/79 mmHg  Pulse 59  Temp(Src) 98.2 F (36.8 C) (Oral)  Ht 5\' 1"  (1.549 m)  Wt 158 lb (71.668 kg)  BMI 29.87 kg/m2 GEN: NAD EXTR: Right shoulder ROM in tact, with significant pain during exam but moves easily occasionally; somewhat inconsistent Tender anterior shoulder Pain with abduction, flexion, extension, controlled adduction, internal and external rotation, taking off shirt Positive empty can test No redness or deformity    Assessment:     [name] [name] is a 49 y.o. female here for right shoulder pain.    Plan:     # See problem list and after visit summary for problem-specific plans.  Follow-up: - 2 months f/u pain. - Follow up in 1 month to discuss cyclic vomiting. - Discussed she needs BP and DM  f/u when available.   Hilton Sinclair, MD [place]

## 2015-01-06 NOTE — Assessment & Plan Note (Signed)
Rotator cuff syndrome per exam by [facility] July 2015. Consistent with this today with pain with adduction, abduction; of note, ROM okay in clinic when not examining. Pain worse at night especially if put pressure on it. - Oxycodone fill today. Use minimally.  - PT referral placed. Explained this is mainstay of therapy. - Too early for injection. Plans to return for this. - lyrica 25mg  qhs prn - described not increasing dose due to CrCl 31 (being conservative) - Recommended heating pad, continuing voltaren gel (refilled), minimize overhead activity, keep ROM to avoid frozen shoulder.

## 2015-01-07 ENCOUNTER — Encounter: Payer: Self-pay | Admitting: *Deleted

## 2015-01-07 NOTE — Progress Notes (Signed)
Completed and given back to [place].  Hilton Sinclair, MD

## 2015-01-07 NOTE — Progress Notes (Signed)
Prior Authorization received from [employer] for Voltaren gel.  PA form placed in provider box for completion. Derl Barrow, RN

## 2015-01-07 NOTE — Assessment & Plan Note (Signed)
-   Ativan refilled x 1 month. - Discussed pt needs visit within the month to discuss.

## 2015-01-07 NOTE — Progress Notes (Signed)
Faxed to [facility] for review.  Derl Barrow, RN

## 2015-01-08 NOTE — Progress Notes (Signed)
PA denied for Voltaren gel from [facility].  [facility] pharmacy aware of denial.  Denial forms placed in provider box for review.  Derl Barrow, RN

## 2015-01-09 ENCOUNTER — Encounter: Payer: Self-pay | Admitting: Family Medicine

## 2015-01-09 NOTE — Progress Notes (Signed)
Filled out appeal and placed in fax bin to be faxed.  Hilton Sinclair, MD

## 2015-01-10 ENCOUNTER — Telehealth: Payer: Self-pay | Admitting: Family Medicine

## 2015-01-10 NOTE — Telephone Encounter (Signed)
Need to speak with you regarding appeal for medication of [name] [name].  Need to ask more questions regarding medical before processing.  Please call him back at [phone] ext [phone]

## 2015-01-22 NOTE — Telephone Encounter (Signed)
[facility] sent a letter stating the received my request for appeal 01/09/15 and denied the appeal regarding their initial denial of covering voltaren gel.  I filled out the [facility] "Request for Reconsideration of Medicare Prescription Drug Denial" and faxed it today to [phone] ([place]) as directed on the form.  Hilton Sinclair, MD

## 2015-01-23 ENCOUNTER — Ambulatory Visit: Payer: Medicare Other | Attending: Family Medicine | Admitting: Physical Therapy

## 2015-01-28 DIAGNOSIS — F331 Major depressive disorder, recurrent, moderate: Secondary | ICD-10-CM | POA: Diagnosis not present

## 2015-01-29 ENCOUNTER — Telehealth: Payer: Self-pay | Admitting: Family Medicine

## 2015-01-29 NOTE — Telephone Encounter (Signed)
Will forward to MD to possibly advise over the phone.  Pt will likely need to come in and be seen. [name] [name],CMA

## 2015-01-29 NOTE — Telephone Encounter (Signed)
She will need clinic appointment to examine and perhaps do testing for causes for vaginal /urethral bleeding and rectal exam to check for hemorrhoids. If she begins to bleed very heavily and is feeling she is losing too much blood, she should go to ED but it does not sound like this.  Hilton Sinclair, MD

## 2015-01-29 NOTE — Telephone Encounter (Signed)
Hasnt had a period for 12 years but when she wiped this morning there was blood. There was no blood in the toilet, The blood was pinkish red Please advise

## 2015-01-30 NOTE — Telephone Encounter (Signed)
Spoke with patient and she is has not had any bleeding since the one episode yesterday.  She states that she does have some discomfort when she urinates and that there is pressure in her bladder and fullness.  Appt made for her tomorrow with Dr. Venetia Maxon on same day to check her urine. Alena Blankenbeckler,CMA

## 2015-01-31 ENCOUNTER — Emergency Department (HOSPITAL_COMMUNITY): Payer: Medicare Other

## 2015-01-31 ENCOUNTER — Ambulatory Visit: Payer: Medicaid Other | Admitting: Family Medicine

## 2015-01-31 ENCOUNTER — Encounter (HOSPITAL_COMMUNITY): Payer: Self-pay | Admitting: Emergency Medicine

## 2015-01-31 ENCOUNTER — Inpatient Hospital Stay (HOSPITAL_COMMUNITY)
Admission: EM | Admit: 2015-01-31 | Discharge: 2015-02-03 | DRG: 690 | Disposition: A | Discharge status: Home / Self Care | Payer: Medicare Other | Attending: Family Medicine | Admitting: Family Medicine

## 2015-01-31 DIAGNOSIS — Z9842 Cataract extraction status, left eye: Secondary | ICD-10-CM

## 2015-01-31 DIAGNOSIS — R10A1 Flank pain, right side: Secondary | ICD-10-CM

## 2015-01-31 DIAGNOSIS — Z91030 Bee allergy status: Secondary | ICD-10-CM

## 2015-01-31 DIAGNOSIS — G43A0 Cyclical vomiting, not intractable: Secondary | ICD-10-CM | POA: Diagnosis not present

## 2015-01-31 DIAGNOSIS — Z9841 Cataract extraction status, right eye: Secondary | ICD-10-CM | POA: Diagnosis not present

## 2015-01-31 DIAGNOSIS — Z961 Presence of intraocular lens: Secondary | ICD-10-CM | POA: Diagnosis not present

## 2015-01-31 DIAGNOSIS — Z794 Long term (current) use of insulin: Secondary | ICD-10-CM

## 2015-01-31 DIAGNOSIS — F329 Major depressive disorder, single episode, unspecified: Secondary | ICD-10-CM | POA: Diagnosis not present

## 2015-01-31 DIAGNOSIS — G4700 Insomnia, unspecified: Secondary | ICD-10-CM | POA: Diagnosis present

## 2015-01-31 DIAGNOSIS — N1330 Unspecified hydronephrosis: Secondary | ICD-10-CM | POA: Diagnosis not present

## 2015-01-31 DIAGNOSIS — I252 Old myocardial infarction: Secondary | ICD-10-CM

## 2015-01-31 DIAGNOSIS — E1122 Type 2 diabetes mellitus with diabetic chronic kidney disease: Secondary | ICD-10-CM | POA: Diagnosis not present

## 2015-01-31 DIAGNOSIS — Z90710 Acquired absence of both cervix and uterus: Secondary | ICD-10-CM | POA: Diagnosis not present

## 2015-01-31 DIAGNOSIS — R109 Unspecified abdominal pain: Secondary | ICD-10-CM | POA: Diagnosis not present

## 2015-01-31 DIAGNOSIS — N179 Acute kidney failure, unspecified: Secondary | ICD-10-CM | POA: Diagnosis not present

## 2015-01-31 DIAGNOSIS — D649 Anemia, unspecified: Secondary | ICD-10-CM | POA: Diagnosis not present

## 2015-01-31 DIAGNOSIS — E785 Hyperlipidemia, unspecified: Secondary | ICD-10-CM | POA: Diagnosis not present

## 2015-01-31 DIAGNOSIS — R112 Nausea with vomiting, unspecified: Secondary | ICD-10-CM | POA: Diagnosis not present

## 2015-01-31 DIAGNOSIS — F1290 Cannabis use, unspecified, uncomplicated: Secondary | ICD-10-CM | POA: Diagnosis not present

## 2015-01-31 DIAGNOSIS — I129 Hypertensive chronic kidney disease with stage 1 through stage 4 chronic kidney disease, or unspecified chronic kidney disease: Secondary | ICD-10-CM | POA: Diagnosis not present

## 2015-01-31 DIAGNOSIS — E559 Vitamin D deficiency, unspecified: Secondary | ICD-10-CM | POA: Diagnosis present

## 2015-01-31 DIAGNOSIS — F17210 Nicotine dependence, cigarettes, uncomplicated: Secondary | ICD-10-CM | POA: Diagnosis present

## 2015-01-31 DIAGNOSIS — Z881 Allergy status to other antibiotic agents status: Secondary | ICD-10-CM

## 2015-01-31 DIAGNOSIS — K219 Gastro-esophageal reflux disease without esophagitis: Secondary | ICD-10-CM | POA: Diagnosis present

## 2015-01-31 DIAGNOSIS — Z888 Allergy status to other drugs, medicaments and biological substances status: Secondary | ICD-10-CM | POA: Diagnosis not present

## 2015-01-31 DIAGNOSIS — G43909 Migraine, unspecified, not intractable, without status migrainosus: Secondary | ICD-10-CM | POA: Diagnosis not present

## 2015-01-31 DIAGNOSIS — F419 Anxiety disorder, unspecified: Secondary | ICD-10-CM | POA: Diagnosis present

## 2015-01-31 DIAGNOSIS — N183 Chronic kidney disease, stage 3 (moderate): Secondary | ICD-10-CM | POA: Diagnosis present

## 2015-01-31 DIAGNOSIS — N12 Tubulo-interstitial nephritis, not specified as acute or chronic: Secondary | ICD-10-CM

## 2015-01-31 DIAGNOSIS — E1143 Type 2 diabetes mellitus with diabetic autonomic (poly)neuropathy: Secondary | ICD-10-CM | POA: Diagnosis not present

## 2015-01-31 DIAGNOSIS — K3184 Gastroparesis: Secondary | ICD-10-CM | POA: Diagnosis present

## 2015-01-31 DIAGNOSIS — Z9049 Acquired absence of other specified parts of digestive tract: Secondary | ICD-10-CM | POA: Diagnosis present

## 2015-01-31 LAB — URINALYSIS, ROUTINE W REFLEX MICROSCOPIC
BILIRUBIN URINE: NEGATIVE
Glucose, UA: 500 mg/dL — AB
KETONES UR: NEGATIVE mg/dL
Nitrite: NEGATIVE
PH: 6 (ref 5.0–8.0)
Protein, ur: 100 mg/dL — AB
SPECIFIC GRAVITY, URINE: 1.014 (ref 1.005–1.030)
Urobilinogen, UA: 0.2 mg/dL (ref 0.0–1.0)

## 2015-01-31 LAB — CBC WITH DIFFERENTIAL/PLATELET
Basophils Absolute: 0 10*3/uL (ref 0.0–0.1)
Basophils Relative: 0 % (ref 0–1)
Eosinophils Absolute: 0 10*3/uL (ref 0.0–0.7)
Eosinophils Relative: 0 % (ref 0–5)
HCT: 29.5 % — ABNORMAL LOW (ref 36.0–46.0)
HEMOGLOBIN: 10.1 g/dL — AB (ref 12.0–15.0)
LYMPHS PCT: 11 % — AB (ref 12–46)
Lymphs Abs: 1.1 10*3/uL (ref 0.7–4.0)
MCH: 31.5 pg (ref 26.0–34.0)
MCHC: 34.2 g/dL (ref 30.0–36.0)
MCV: 91.9 fL (ref 78.0–100.0)
MONO ABS: 0.8 10*3/uL (ref 0.1–1.0)
Monocytes Relative: 8 % (ref 3–12)
NEUTROS ABS: 8.2 10*3/uL — AB (ref 1.7–7.7)
NEUTROS PCT: 81 % — AB (ref 43–77)
Platelets: 142 10*3/uL — ABNORMAL LOW (ref 150–400)
RBC: 3.21 MIL/uL — ABNORMAL LOW (ref 3.87–5.11)
RDW: 13.9 % (ref 11.5–15.5)
WBC: 10 10*3/uL (ref 4.0–10.5)

## 2015-01-31 LAB — COMPREHENSIVE METABOLIC PANEL
ALT: 10 U/L (ref 0–35)
ANION GAP: 10 (ref 5–15)
AST: 15 U/L (ref 0–37)
Albumin: 3.9 g/dL (ref 3.5–5.2)
Alkaline Phosphatase: 88 U/L (ref 39–117)
BUN: 44 mg/dL — ABNORMAL HIGH (ref 6–23)
CALCIUM: 9 mg/dL (ref 8.4–10.5)
CHLORIDE: 100 mmol/L (ref 96–112)
CO2: 23 mmol/L (ref 19–32)
Creatinine, Ser: 2.5 mg/dL — ABNORMAL HIGH (ref 0.50–1.10)
GFR calc Af Amer: 25 mL/min — ABNORMAL LOW (ref >90)
GFR calc non Af Amer: 21 mL/min — ABNORMAL LOW (ref >90)
GLUCOSE: 384 mg/dL — AB (ref 70–99)
Potassium: 3.8 mmol/L (ref 3.5–5.1)
Sodium: 133 mmol/L — ABNORMAL LOW (ref 135–145)
Total Bilirubin: 1 mg/dL (ref 0.3–1.2)
Total Protein: 7.2 g/dL (ref 6.0–8.3)

## 2015-01-31 LAB — URINE MICROSCOPIC-ADD ON

## 2015-01-31 LAB — GLUCOSE, CAPILLARY: GLUCOSE-CAPILLARY: 305 mg/dL — AB (ref 70–99)

## 2015-01-31 LAB — LIPASE, BLOOD: LIPASE: 23 U/L (ref 11–59)

## 2015-01-31 MED ORDER — HYDROMORPHONE HCL 1 MG/ML IJ SOLN: 1.0000 mg | INTRAMUSCULAR | Status: DC | PRN

## 2015-01-31 MED ORDER — DEXTROSE 5 % IV SOLN
1.0000 g | Freq: Once | INTRAVENOUS | Status: AC
  Administered 2015-01-31: 1 g via INTRAVENOUS
  Filled 2015-01-31: qty 10

## 2015-01-31 MED ORDER — SODIUM CHLORIDE 0.9 % IV SOLN
INTRAVENOUS | Status: DC
  Administered 2015-01-31 – 2015-02-02 (×5): via INTRAVENOUS

## 2015-01-31 MED ORDER — INSULIN GLARGINE 100 UNIT/ML ~~LOC~~ SOLN
20.0000 [IU] | Freq: Every day | SUBCUTANEOUS | Status: DC
Start: 2015-01-31 — End: 2015-02-03
  Administered 2015-01-31 – 2015-02-02 (×3): 20 [IU] via SUBCUTANEOUS
  Filled 2015-01-31 (×4): qty 0.2

## 2015-01-31 MED ORDER — LORAZEPAM 2 MG/ML IJ SOLN
0.5000 mg | Freq: Once | INTRAMUSCULAR | Status: AC
  Administered 2015-01-31: 0.5 mg via INTRAVENOUS
  Filled 2015-01-31: qty 1

## 2015-01-31 MED ORDER — HYDROMORPHONE HCL 1 MG/ML IJ SOLN
1.0000 mg | Freq: Once | INTRAMUSCULAR | Status: AC
  Administered 2015-01-31: 1 mg via INTRAVENOUS
  Filled 2015-01-31: qty 1

## 2015-01-31 MED ORDER — CEFTRIAXONE SODIUM IN DEXTROSE 20 MG/ML IV SOLN
1.0000 g | INTRAVENOUS | Status: DC
  Administered 2015-02-01 – 2015-02-03 (×3): 1 g via INTRAVENOUS
  Filled 2015-01-31 (×3): qty 50

## 2015-01-31 MED ORDER — LISINOPRIL 40 MG PO TABS
40.0000 mg | ORAL_TABLET | Freq: Every day | ORAL | Status: DC
  Administered 2015-01-31 – 2015-02-03 (×4): 40 mg via ORAL
  Filled 2015-01-31 (×4): qty 1

## 2015-01-31 MED ORDER — PROMETHAZINE HCL 25 MG/ML IJ SOLN
12.5000 mg | Freq: Once | INTRAMUSCULAR | Status: AC
  Administered 2015-01-31: 12.5 mg via INTRAVENOUS
  Filled 2015-01-31: qty 1

## 2015-01-31 MED ORDER — INSULIN ASPART 100 UNIT/ML ~~LOC~~ SOLN
0.0000 [IU] | Freq: Three times a day (TID) | SUBCUTANEOUS | Status: DC
Start: 2015-02-01 — End: 2015-02-03
  Administered 2015-02-01 (×2): 1 [IU] via SUBCUTANEOUS

## 2015-01-31 MED ORDER — HYDROMORPHONE HCL 1 MG/ML IJ SOLN
1.0000 mg | INTRAMUSCULAR | Status: AC
  Administered 2015-01-31: 1 mg via INTRAVENOUS
  Filled 2015-01-31: qty 1

## 2015-01-31 MED ORDER — HEPARIN SODIUM (PORCINE) 5000 UNIT/ML IJ SOLN
5000.0000 [IU] | Freq: Three times a day (TID) | INTRAMUSCULAR | Status: DC
  Administered 2015-01-31 – 2015-02-02 (×7): 5000 [IU] via SUBCUTANEOUS
  Filled 2015-01-31 (×9): qty 1

## 2015-01-31 MED ORDER — IBUPROFEN 200 MG PO TABS
400.0000 mg | ORAL_TABLET | Freq: Once | ORAL | Status: AC
  Administered 2015-01-31: 400 mg via ORAL
  Filled 2015-01-31: qty 2

## 2015-01-31 MED ORDER — ONDANSETRON HCL 4 MG/2ML IJ SOLN: 4.0000 mg | Freq: Four times a day (QID) | INTRAMUSCULAR | Status: DC | PRN

## 2015-01-31 MED ORDER — METOCLOPRAMIDE HCL 10 MG PO TABS
10.0000 mg | ORAL_TABLET | Freq: Four times a day (QID) | ORAL | Status: DC | PRN
  Administered 2015-02-01 – 2015-02-02 (×2): 10 mg via ORAL
  Filled 2015-01-31 (×3): qty 1

## 2015-01-31 MED ORDER — CARVEDILOL 25 MG PO TABS
25.0000 mg | ORAL_TABLET | Freq: Two times a day (BID) | ORAL | Status: DC
  Administered 2015-02-01 – 2015-02-03 (×5): 25 mg via ORAL
  Filled 2015-01-31 (×7): qty 1

## 2015-01-31 MED ORDER — PROMETHAZINE HCL 25 MG/ML IJ SOLN
12.5000 mg | Freq: Once | INTRAMUSCULAR | Status: DC
Start: 2015-01-31 — End: 2015-01-31
  Filled 2015-01-31: qty 1

## 2015-01-31 MED ORDER — SODIUM CHLORIDE 0.9 % IV BOLUS (SEPSIS)
1000.0000 mL | INTRAVENOUS | Status: AC
Start: 2015-01-31 — End: 2015-01-31
  Administered 2015-01-31: 1000 mL via INTRAVENOUS

## 2015-01-31 MED ORDER — MORPHINE SULFATE 2 MG/ML IJ SOLN
2.0000 mg | INTRAMUSCULAR | Status: DC | PRN
  Filled 2015-01-31: qty 1

## 2015-01-31 MED ORDER — HYDROMORPHONE HCL 1 MG/ML IJ SOLN
1.0000 mg | INTRAMUSCULAR | Status: DC | PRN
  Administered 2015-01-31 – 2015-02-01 (×5): 1 mg via INTRAVENOUS
  Filled 2015-01-31 (×5): qty 1

## 2015-01-31 MED ORDER — LORAZEPAM 1 MG PO TABS
1.0000 mg | ORAL_TABLET | Freq: Two times a day (BID) | ORAL | Status: DC | PRN
  Administered 2015-02-01 – 2015-02-03 (×4): 1 mg via ORAL
  Filled 2015-01-31 (×4): qty 1

## 2015-01-31 MED ORDER — ONDANSETRON HCL 4 MG/2ML IJ SOLN
4.0000 mg | Freq: Once | INTRAMUSCULAR | Status: AC
  Administered 2015-01-31: 4 mg via INTRAVENOUS
  Filled 2015-01-31: qty 2

## 2015-01-31 MED ORDER — IOHEXOL 300 MG/ML  SOLN
50.0000 mL | Freq: Once | INTRAMUSCULAR | Status: AC | PRN
  Administered 2015-01-31: 50 mL via ORAL

## 2015-01-31 NOTE — ED Notes (Signed)
[facility] called. 

## 2015-01-31 NOTE — Progress Notes (Addendum)
Patient arrived to room from [facility] ED. Safety precautions reviewed with patient. Teaching services paged. Awaiting for call back and order. Patient requested to eat. RN explained to her that MD is coming to see her. Clear liquid order reviewed. Patient with Portacath. IV team consult to restart [facility]. Will continue to monitor,  Ave Filter, RN

## 2015-01-31 NOTE — ED Notes (Signed)
Pt keys given to registration, per pt consent, so that boyfriend can pick them up to move her car.

## 2015-01-31 NOTE — ED Notes (Signed)
Attempted to call report, RN unavailable to take at this time. Told to call back in 10 minutes.

## 2015-01-31 NOTE — ED Notes (Signed)
Pt c/o right flank pain that started last night. Pt states that she had n/v during the night as well.  Pt states that two days ago she wiped after voiding and noticed some blood but hasnt seen any more since.

## 2015-01-31 NOTE — ED Provider Notes (Signed)
CSN: [id number]     Arrival date & time 01/31/15  0844 History   First MD Initiated Contact with Patient 01/31/15 0845     Chief Complaint  Patient presents with  . Flank Pain  . Emesis     (Consider location/radiation/quality/duration/timing/severity/associated sxs/prior Treatment) Patient is a 49 y.o. female presenting with flank pain and vomiting. The history is provided by the patient.  Flank Pain This is a new problem. The current episode started 12 to 24 hours ago. The problem occurs constantly. The problem has not changed since onset.Associated symptoms include abdominal pain. Pertinent negatives include no chest pain, no headaches and no shortness of breath. Nothing aggravates the symptoms. Nothing relieves the symptoms. She has tried nothing for the symptoms. The treatment provided no relief.  Emesis Severity:  Moderate Duration:  12 hours Timing:  Constant Quality:  Stomach contents Associated symptoms: abdominal pain and diarrhea   Associated symptoms: no headaches     Past Medical History  Diagnosis Date  . PANCREATITIS 05/09/2008  . Cyclic vomiting syndrome   . Hypertension   . Complication of anesthesia     "problems waking up"  . Heart murmur   . Myocardial infarction 07/2007    "they say I've had a silent one; I don't know"  . Pneumonia   . Anemia   . Blood transfusion   . GERD (gastroesophageal reflux disease)   . Anxiety   . Depression   . Smoker   . Marijuana smoker, continuous   . NEUROPATHY, DIABETIC 02/23/2007  . Type II diabetes mellitus   . Diabetic gastroparesis     /e-chart  . Chronic kidney disease (CKD), stage III (moderate)    Past Surgical History  Procedure Laterality Date  . Port-a-cath placement  ~ 2008    right chest; "poor access; frequent hospitalizations"  . Laparotomy and lysis of adhesions    . Abdominal hysterectomy  02/2002  . Cholecystectomy  1980's  . Cardiac catheterization    . Dilation and evacuation  X3  . Left hand  surgery    . Cataract extraction w/ intraocular lens  implant, bilateral    . Colonoscopy    . Upper gi endoscopy     Family History  Problem Relation Age of Onset  . Diabetes type II Mother   . Diabetes Mother   . Hypertension Mother   . Diabetes type II Sister   . Diabetes Sister   . Hypertension Sister   . Hypertension Brother    History  Substance Use Topics  . Smoking status: Current Some Day Smoker -- 0.50 packs/day for 22 years    Types: Cigarettes  . Smokeless tobacco: Never Used  . Alcohol Use: No   OB History    No data available     Review of Systems  Constitutional: Negative for fever and fatigue.  HENT: Negative for congestion and drooling.   Eyes: Negative for pain.  Respiratory: Negative for cough and shortness of breath.   Cardiovascular: Negative for chest pain.  Gastrointestinal: Positive for nausea, vomiting, abdominal pain and diarrhea.  Genitourinary: Positive for dysuria, hematuria and flank pain.  Musculoskeletal: Positive for back pain. Negative for gait problem and neck pain.  Skin: Negative for color change.  Neurological: Negative for dizziness and headaches.  Hematological: Negative for adenopathy.  Psychiatric/Behavioral: Negative for behavioral problems.  All other systems reviewed and are negative.     Allergies  Bee venom; Acetaminophen; Novolog; and Erythromycin  Home Medications   Prior  to Admission medications   Medication Sig Start Date End Date Taking? Authorizing Provider  carvedilol (COREG) 25 MG tablet Take 25 mg by mouth 2 (two) times daily with a meal.    Historical Provider, MD  diclofenac sodium (VOLTAREN) 1 % GEL APPLY 2 GRAMS TOPICALLY 4 TIMES DAILY 01/06/15   Hilton Sinclair, MD  insulin glargine (LANTUS) 100 UNIT/ML injection Inject 20 Units into the skin daily.     Historical Provider, MD  lisinopril (PRINIVIL,ZESTRIL) 40 MG tablet Take 40 mg by mouth daily.    Historical Provider, MD  LORazepam (ATIVAN) 1 MG  tablet Take 1 tablet (1 mg total) by mouth 2 (two) times daily as needed for anxiety. 01/06/15   Hilton Sinclair, MD  metoCLOPramide (REGLAN) 10 MG tablet Take 1 tablet (10 mg total) by mouth every 6 (six) hours as needed for nausea. 11/25/14   Mirna Mires, MD  metroNIDAZOLE (METROGEL) 0.75 % gel Apply 1 application topically 2 (two) times daily. For 5 days. 11/27/14   Hilton Sinclair, MD  oxyCODONE (OXY IR/ROXICODONE) 5 MG immediate release tablet Take one tab by mouth up to twice a day as needed for severe pain 01/06/15   Hilton Sinclair, MD  pregabalin (LYRICA) 25 MG capsule Take 1 capsule (25 mg total) by mouth at bedtime as needed. 01/06/15   Hilton Sinclair, MD  promethazine (PHENERGAN) 25 MG suppository Place 1 suppository (25 mg total) rectally every 6 (six) hours as needed for nausea or vomiting. 11/06/14   Dickie La, MD  promethazine (PHENERGAN) 25 MG tablet Take 1 tablet (25 mg total) by mouth every 6 (six) hours as needed for nausea or vomiting. 11/06/14   Dickie La, MD  SUMAtriptan (IMITREX) 50 MG tablet Take 50 mg by mouth every 2 (two) hours as needed for migraine. May repeat in 2 hours if headache persists or recurs.    Historical Provider, MD  Vitamin D, Ergocalciferol, (DRISDOL) 50000 UNITS CAPS capsule Take 1 capsule (50,000 Units total) by mouth every 7 (seven) days. For 8 weeks, then come in for recheck 11/28/14   Hilton Sinclair, MD   BP 111/61 mmHg  Pulse 112  Temp(Src) 100.7 F (38.2 C) (Oral)  Resp 19  SpO2 97% Physical Exam  Constitutional: She is oriented to person, place, and time. She appears well-developed and well-nourished.  Pt appears uncomfortable.   HENT:  Head: Normocephalic.  Mouth/Throat: Oropharynx is clear and moist. No oropharyngeal exudate.  Eyes: Conjunctivae and EOM are normal. Pupils are equal, round, and reactive to light.  Neck: Normal range of motion. Neck supple.  Cardiovascular: Regular rhythm, normal heart sounds  and intact distal pulses.  Exam reveals no gallop and no friction rub.   No murmur heard. HR 110  Pulmonary/Chest: Effort normal and breath sounds normal. No respiratory distress. She has no wheezes.  Abdominal: Soft. Bowel sounds are normal. There is no tenderness. There is no rebound and no guarding.  Musculoskeletal: Normal range of motion. She exhibits no edema.  Mild right CVA ttp.   Neurological: She is alert and oriented to person, place, and time.  Skin: Skin is warm and dry.  Psychiatric: She has a normal mood and affect. Her behavior is normal.  Nursing note and vitals reviewed.   ED Course  Procedures (including critical care time) Labs Review Labs Reviewed  CBC WITH DIFFERENTIAL/PLATELET - Abnormal; Notable for the following:    RBC 3.21 (*)    Hemoglobin 10.1 (*)  HCT 29.5 (*)    Platelets 142 (*)    Neutrophils Relative % 81 (*)    Neutro Abs 8.2 (*)    Lymphocytes Relative 11 (*)    All other components within normal limits  COMPREHENSIVE METABOLIC PANEL - Abnormal; Notable for the following:    Sodium 133 (*)    Glucose, Bld 384 (*)    BUN 44 (*)    Creatinine, Ser 2.50 (*)    GFR calc non Af Amer 21 (*)    GFR calc Af Amer 25 (*)    All other components within normal limits  URINALYSIS, ROUTINE W REFLEX MICROSCOPIC - Abnormal; Notable for the following:    APPearance TURBID (*)    Glucose, UA 500 (*)    Hgb urine dipstick LARGE (*)    Protein, ur 100 (*)    Leukocytes, UA MODERATE (*)    All other components within normal limits  URINE MICROSCOPIC-ADD ON - Abnormal; Notable for the following:    Squamous Epithelial / LPF FEW (*)    Bacteria, UA MANY (*)    All other components within normal limits  URINE CULTURE  LIPASE, BLOOD    Imaging Review Ct Abdomen Pelvis Wo Contrast  01/31/2015   CLINICAL DATA:  Right flank pain started last night. Nausea vomiting during the night as well.  EXAM: CT ABDOMEN AND PELVIS WITHOUT CONTRAST  TECHNIQUE:  Multidetector CT imaging of the abdomen and pelvis was performed following the standard protocol without IV contrast.  COMPARISON:  None.  FINDINGS: The lung bases are clear.  There is no renal, ureteral or bladder calculus. There is bilateral perinephric stranding. There is mild right hydroureteronephrosis. There is a small amount of right perinephric fluid. The kidneys are symmetric in size without evidence for exophytic mass. There is mild bladder wall thickening.  The liver demonstrates no focal abnormality. The gallbladder is surgically absent. The spleen demonstrates no focal abnormality. The adrenal glands and pancreas are normal.  The stomach, duodenum, small intestine and large intestine are unremarkable. There is a normal caliber appendix in the right lower quadrant without periappendiceal inflammatory changes. There is no pneumoperitoneum, pneumatosis, or portal venous gas. There is no abdominal or pelvic free fluid. There is a mildly enlarged right inguinal lymph node measuring 12 mm in short axis.  The abdominal aorta is normal in caliber with atherosclerosis.  The osseous structures are unremarkable.  IMPRESSION: 1. No urolithiasis. Bilateral perinephric stranding, right worse than left with mild right hydroureteronephrosis. Small amount of right perinephric fluid. These findings can be seen in the setting of pyelonephritis. Correlate with urinalysis. 2. Mild bladder wall thickening which may be secondary to underdistention versus cystitis. 3. Mildly enlarged right inguinal lymph node which is likely reactive.   Electronically Signed   By: Kathreen Devoid   On: 01/31/2015 12:13     EKG Interpretation None      MDM   Final diagnoses:  Right flank pain  Pyelonephritis    8:59 AM 49 y.o. female with a history of pancreatitis, cyclical vomiting syndrome, gastroparesis, diabetes, chronic kidney disease who presents with right flank pain, nausea, and vomiting which began last night. She also  notes some blood seen on the tissue paper when wiping after urinating 2 days ago. She has had suprapubic pressure and dysuria. She denies any blood in her stools or emesis. She was found to have a low-grade fever here and is mildly tachycardic. Will get pain control, screening labs, IV fluids,  imaging.   Will admit to [facility] for pain/nausea control.    Pamella Pert, MD 01/31/15 [phone]

## 2015-01-31 NOTE — Progress Notes (Signed)
Report received from Ander Purpura, RN from [employer]. Awaiting for patient to arrive to unit 6E.

## 2015-01-31 NOTE — ED Notes (Signed)
Attempted to call report again, RN unavailable still. Charge RN to call this RN back.

## 2015-01-31 NOTE — H&P (Signed)
[facility] Admission History and Physical Service Pager: [phone]  Patient name: [name] [name] Medical record number: [id number] Date of birth: Oct 11, 1966 Age: 49 y.o. Gender: female  Primary Care Provider: Conni Slipper, MD Consultants: None Code Status: Full  Chief Complaint: Nausea / Vomiting, Right back / abdominal pain.   Assessment and Plan: [name] [name] is a 49 y.o. female presenting with concern for pyelonephritis . PMH is significant for DMII, Gastroparesis, GERD, Anxiety, Depression, Cyclic vomiting Syndrome, CKD III  Pyelonephritis - pt. Here with right flank pain, nausea, and vomiting starting last night. Suprapubic pressure and dysuria. Low grade fever in the Ed 100.7. Slightly tachycardic. CT abd with findings on the right consistent with pyelonephritis. CVA tenderness and right side tenderness to exam. WBC is 10 with left shift. - Admitted to med surg - Vitals per floor. - Follow up urine culture and blood culture (bld cx after abx) - Follow up am WBC  - Ceftriaxone IV 1g q24hr. Consider switching to po cipro vs. 3rd gen cephalosporin tomo if improving.  F/U sensitivities of UCx - Zofran prn, Reglan for Gastroparesis and nausea.  - Morphine for pain control.  - 1.5 x MIVF - Clear diet  AKI on CKD III - Pt. With Cr of 2.5 up from baseline of 1.9. In the setting of nausea / vomiting / pyelonephritis. BUN / CR near 20. Likely prerenal.  - 1.5x MIVF as above.  - Trend BMET.  - Avoiding nephrotoxic meds  Migraine -  On sumitriptan at home. No headache at this time. Continue to monitor.  - Will attempt tylenol, but avoiding nephrotoxic meds as above.  - Sumitriptan if migraine arises.   Anxiety / Depression  - pt. Not currently anxious or depressed. Given ativan in the ED with some success.  - Will continue Lyrica here.  - Ativan 0.5mg  BID prn anxiety.  - On Ativan 1mg  BID at home.   HTN -  Mostly normotensive here with one BP  145/80.  - Continue home meds - Coreg, Lisinopril  DMII  - on Lantus at home 20 u. Last A1C 7.2 - CBG's here 384.  - Monitor CBG's qam qhs - Sensitive SSI.   Gastroparesis  - longstanding DM. Here with nausea and vomiting mostly related to pain.  - Continue home reglan in addition to zofran for nausea.   Cyclic Vomiting Syndrome - pt. Feels that this episode is not related to her normal problems with nausea and vomiting, and that it is mostly related to the pain .    FEN/GI:  - 1.5x MIVF - Clear liquids. Will advance as tolerated.   Prophylaxis: Sub q heparin.   Disposition: Pending improvement and transition to PO antibiotics.   History of Present Illness: [name] [name] is a 49 y.o. female presenting with increased urinary frequency, dysuria, and right sided pain / back pain. Having chills, and tactile fevers. Has had nausea / vomiting x 2 days. Denies any other symptoms at this time. Feels that she is hungry. No frontal abdominal pain.    Pt states about two days ago she had severe nausea, vomiting, suprapubic pain along with CVA pain.  Does endorse some fever (subjective) without chills.  Denies any CP, worsening abdominal pain, sick contacts, travel, illicit substance use.  Did not notice hematuria or pain in the center of her back.  In the ED, they performed basic labs showing L shift with neutrophilia and bacteruria on UA.  They performed CT  abdomen which showed possible pyleo.  Started on Rocephin in ED, obtained UCx and sent to [facility] for further evaluation.   Review Of Systems: Per HPI with the following additions: None Otherwise 12 point review of systems was performed and was unremarkable.  Patient Active Problem List   Diagnosis Date Noted  . Pyelonephritis 01/31/2015  . Encounter for chronic pain management 01/06/2015  . Vitamin D deficiency 11/28/2014  . At risk for polypharmacy 08/15/2014  . Vaginal discharge 08/15/2014  . Other and unspecified hyperlipidemia  08/15/2014  . Leg cramping 07/18/2014  . Rotator cuff syndrome 04/19/2014  . Biceps tendinopathy of right upper extremity 04/05/2014  . Protein-calorie malnutrition, severe 12/17/2013  . Anemia 11/15/2013  . Pancreatitis, acute 10/14/2013  . CKD (chronic kidney disease) 08/17/2013  . Abdominal pain, epigastric 04/19/2013  . Esophagitis 04/18/2013  . Preventative health care 09/18/2012  . High risk social situation 08/31/2012  . Allergic rhinitis, seasonal 05/04/2012  . Marijuana smoker 11/03/2011  . Uncontrolled secondary diabetes mellitus with stage 3 CKD (GFR 30-59) 10/23/2011  . Cyclic vomiting syndrome 08/03/2011  . Hot flashes 07/29/2011  . Backache 04/10/2007  . DEPRESSION, MAJOR, RECURRENT 02/23/2007  . ANXIETY 02/23/2007  . TOBACCO DEPENDENCE 02/23/2007  . MIGRAINE, UNSPEC., W/O INTRACTABLE MIGRAINE 02/23/2007  . Essential hypertension, malignant 02/23/2007  . INSOMNIA NOS 02/23/2007  . NEUROPATHY, DIABETIC 02/23/2007  . GERD 01/27/2004   Past Medical History: Past Medical History  Diagnosis Date  . PANCREATITIS 05/09/2008  . Cyclic vomiting syndrome   . Hypertension   . Complication of anesthesia     "problems waking up"  . Heart murmur   . Myocardial infarction 07/2007    "they say I've had a silent one; I don't know"  . Pneumonia   . Anemia   . Blood transfusion   . GERD (gastroesophageal reflux disease)   . Anxiety   . Depression   . Smoker   . Marijuana smoker, continuous   . NEUROPATHY, DIABETIC 02/23/2007  . Type II diabetes mellitus   . Diabetic gastroparesis     /e-chart  . Chronic kidney disease (CKD), stage III (moderate)    Past Surgical History: Past Surgical History  Procedure Laterality Date  . Port-a-cath placement  ~ 2008    right chest; "poor access; frequent hospitalizations"  . Laparotomy and lysis of adhesions    . Abdominal hysterectomy  02/2002  . Cholecystectomy  1980's  . Cardiac catheterization    . Dilation and evacuation  X3   . Left hand surgery    . Cataract extraction w/ intraocular lens  implant, bilateral    . Colonoscopy    . Upper gi endoscopy     Social History: History  Substance Use Topics  . Smoking status: Current Some Day Smoker -- 0.50 packs/day for 22 years    Types: Cigarettes  . Smokeless tobacco: Never Used  . Alcohol Use: No   Additional social history: None  Please also refer to relevant sections of EMR.  Family History: Family History  Problem Relation Age of Onset  . Diabetes type II Mother   . Diabetes Mother   . Hypertension Mother   . Diabetes type II Sister   . Diabetes Sister   . Hypertension Sister   . Hypertension Brother    Allergies and Medications: Allergies  Allergen Reactions  . Bee Venom Anaphylaxis  . Acetaminophen Nausea And Vomiting  . Novolog [Insulin Aspart] Other (See Comments)    Muscles in feet cramp  .  Erythromycin Nausea And Vomiting   Current Facility-Administered Medications on File Prior to Encounter  Medication Dose Route Frequency Provider Last Rate Last Dose  . 0.9 %  sodium chloride infusion   Intravenous Continuous Andrena Mews, MD 20 mL/hr at 04/19/13 1500 500 mL at 04/19/13 1500   Current Outpatient Prescriptions on File Prior to Encounter  Medication Sig Dispense Refill  . carvedilol (COREG) 25 MG tablet Take 25 mg by mouth 2 (two) times daily with a meal.    . diclofenac sodium (VOLTAREN) 1 % GEL APPLY 2 GRAMS TOPICALLY 4 TIMES DAILY (Patient taking differently: Apply 2 g topically 4 (four) times daily as needed (pain). ) 100 g 3  . insulin glargine (LANTUS) 100 UNIT/ML injection Inject 20 Units into the skin daily.     Marland Kitchen lisinopril (PRINIVIL,ZESTRIL) 40 MG tablet Take 40 mg by mouth daily.    Marland Kitchen LORazepam (ATIVAN) 1 MG tablet Take 1 tablet (1 mg total) by mouth 2 (two) times daily as needed for anxiety. 30 tablet 0  . oxyCODONE (OXY IR/ROXICODONE) 5 MG immediate release tablet Take one tab by mouth up to twice a day as needed for  severe pain 15 tablet 0  . pregabalin (LYRICA) 25 MG capsule Take 1 capsule (25 mg total) by mouth at bedtime as needed. 30 capsule 1  . promethazine (PHENERGAN) 25 MG suppository Place 1 suppository (25 mg total) rectally every 6 (six) hours as needed for nausea or vomiting. 20 each 1  . promethazine (PHENERGAN) 25 MG tablet Take 1 tablet (25 mg total) by mouth every 6 (six) hours as needed for nausea or vomiting. 30 tablet 2  . SUMAtriptan (IMITREX) 50 MG tablet Take 50 mg by mouth every 2 (two) hours as needed for migraine. May repeat in 2 hours if headache persists or recurs.    . metoCLOPramide (REGLAN) 10 MG tablet Take 1 tablet (10 mg total) by mouth every 6 (six) hours as needed for nausea. (Patient not taking: Reported on 01/31/2015) 30 tablet 0  . metroNIDAZOLE (METROGEL) 0.75 % gel Apply 1 application topically 2 (two) times daily. For 5 days. (Patient not taking: Reported on 01/31/2015) 45 g 0  . Vitamin D, Ergocalciferol, (DRISDOL) 50000 UNITS CAPS capsule Take 1 capsule (50,000 Units total) by mouth every 7 (seven) days. For 8 weeks, then come in for recheck (Patient not taking: Reported on 01/31/2015) 30 capsule 0    Objective: BP 145/80 mmHg  Pulse 68  Temp(Src) 97.9 F (36.6 C) (Oral)  Resp 17  SpO2 100% Exam: General: NAD, AAOx3 HEENT: NCAT, PERRLA, EOMI, MMM Cardiovascular: RRR, No MGR, Normal S1/S2, 2+ distal pulses Respiratory: CTA Bilaterally, appropriate rate, unlabored.  Abdomen: S, Mild TTP over right side. No guarding, no rebound, +BS, Nondistended, No organomegally, fairly significant CVA tenderness on the right side noted.  Extremities: WWP, no peripheral edema, MAEW Skin: No lesions, no rashes.  Neuro: No gross deficits   Labs and Imaging: CBC BMET   Recent Labs Lab 01/31/15 0913  WBC 10.0  HGB 10.1*  HCT 29.5*  PLT 142*    Recent Labs Lab 01/31/15 0913  NA 133*  K 3.8  CL 100  CO2 23  BUN 44*  CREATININE 2.50*  GLUCOSE 384*  CALCIUM 9.0      Urinalysis    Component Value Date/Time   COLORURINE YELLOW 01/31/2015 0924   APPEARANCEUR TURBID* 01/31/2015 0924   LABSPEC 1.014 01/31/2015 0924   PHURINE 6.0 01/31/2015 0924   GLUCOSEU 500*  01/31/2015 0924   HGBUR LARGE* 01/31/2015 0924   HGBUR small 10/19/2010 0852   [place] 01/31/2015 0924   [place] 01/31/2015 0924   PROTEINUR 100* 01/31/2015 0924   UROBILINOGEN 0.2 01/31/2015 0924   NITRITE NEGATIVE 01/31/2015 0924   LEUKOCYTESUR MODERATE* 01/31/2015 0924   Micro:   Urine Culture 2/5 - pending Blood Culture 2/5 (taken after Ceftriaxone started) - pending  Imaging:   CT Abd/Pelvis - 2/5:  IMPRESSION: 1. No urolithiasis. Bilateral perinephric stranding, right worse than left with mild right hydroureteronephrosis. Small amount of right perinephric fluid. These findings can be seen in the setting of pyelonephritis. Correlate with urinalysis. 2. Mild bladder wall thickening which may be secondary to underdistention versus cystitis. 3. Mildly enlarged right inguinal lymph node which is likely reactive.   Aquilla Hacker, MD 01/31/2015, 6:22 PM PGY-1, [place] Intern pager: [phone], text pages welcome   I have seen and the evaluated the pt with Dr. Marta Antu.  We have formulated the plan above, please refer to my additions in red.  Tamela Oddi Awanda Mink, DO of [facility] 01/31/2015, 8:28 PM

## 2015-02-01 DIAGNOSIS — R109 Unspecified abdominal pain: Secondary | ICD-10-CM

## 2015-02-01 DIAGNOSIS — N12 Tubulo-interstitial nephritis, not specified as acute or chronic: Principal | ICD-10-CM

## 2015-02-01 LAB — GLUCOSE, CAPILLARY
Glucose-Capillary: 146 mg/dL — ABNORMAL HIGH (ref 70–99)
Glucose-Capillary: 84 mg/dL (ref 70–99)

## 2015-02-01 LAB — CBC
HCT: 24.7 % — ABNORMAL LOW (ref 36.0–46.0)
HEMOGLOBIN: 8.6 g/dL — AB (ref 12.0–15.0)
MCH: 32.1 pg (ref 26.0–34.0)
MCHC: 34.8 g/dL (ref 30.0–36.0)
MCV: 92.2 fL (ref 78.0–100.0)
PLATELETS: 114 10*3/uL — AB (ref 150–400)
RBC: 2.68 MIL/uL — ABNORMAL LOW (ref 3.87–5.11)
RDW: 14.2 % (ref 11.5–15.5)
WBC: 11.6 10*3/uL — ABNORMAL HIGH (ref 4.0–10.5)

## 2015-02-01 LAB — BASIC METABOLIC PANEL
Anion gap: 5 (ref 5–15)
BUN: 39 mg/dL — ABNORMAL HIGH (ref 6–23)
CHLORIDE: 106 mmol/L (ref 96–112)
CO2: 23 mmol/L (ref 19–32)
Calcium: 7.5 mg/dL — ABNORMAL LOW (ref 8.4–10.5)
Creatinine, Ser: 2.27 mg/dL — ABNORMAL HIGH (ref 0.50–1.10)
GFR calc non Af Amer: 24 mL/min — ABNORMAL LOW (ref >90)
GFR, EST AFRICAN AMERICAN: 28 mL/min — AB (ref >90)
Glucose, Bld: 104 mg/dL — ABNORMAL HIGH (ref 70–99)
Potassium: 3.8 mmol/L (ref 3.5–5.1)
Sodium: 134 mmol/L — ABNORMAL LOW (ref 135–145)

## 2015-02-01 MED ORDER — POLYETHYLENE GLYCOL 3350 17 G PO PACK
17.0000 g | PACK | Freq: Every day | ORAL | Status: DC | PRN
  Administered 2015-02-01: 17 g via ORAL
  Filled 2015-02-01 (×2): qty 1

## 2015-02-01 MED ORDER — SODIUM CHLORIDE 0.9 % IJ SOLN
10.0000 mL | INTRAMUSCULAR | Status: DC | PRN
Start: 2015-02-01 — End: 2015-02-03
  Administered 2015-02-01 – 2015-02-03 (×4): 10 mL
  Filled 2015-02-01 (×3): qty 40

## 2015-02-01 MED ORDER — HYDROMORPHONE HCL 1 MG/ML IJ SOLN: 0.5000 mg | INTRAMUSCULAR | Status: DC | PRN

## 2015-02-01 MED ORDER — SENNA 8.6 MG PO TABS
1.0000 | ORAL_TABLET | Freq: Every evening | ORAL | Status: DC | PRN
  Administered 2015-02-01: 8.6 mg via ORAL
  Filled 2015-02-01 (×2): qty 1

## 2015-02-01 MED ORDER — HYDROMORPHONE HCL 1 MG/ML IJ SOLN
2.0000 mg | INTRAMUSCULAR | Status: DC | PRN
Start: 2015-02-01 — End: 2015-02-01
  Administered 2015-02-01 (×3): 2 mg via INTRAVENOUS
  Filled 2015-02-01 (×3): qty 2

## 2015-02-01 MED ORDER — OXYCODONE HCL 5 MG PO TABS
5.0000 mg | ORAL_TABLET | Freq: Four times a day (QID) | ORAL | Status: DC | PRN
Start: 2015-02-01 — End: 2015-02-03
  Administered 2015-02-02 – 2015-02-03 (×4): 5 mg via ORAL
  Filled 2015-02-01 (×4): qty 1

## 2015-02-01 MED ORDER — ONDANSETRON HCL 4 MG/2ML IJ SOLN
4.0000 mg | Freq: Three times a day (TID) | INTRAMUSCULAR | Status: DC | PRN
  Administered 2015-02-01 – 2015-02-02 (×2): 4 mg via INTRAVENOUS
  Filled 2015-02-01 (×2): qty 2

## 2015-02-01 NOTE — H&P (Signed)
[facility] Admission Progress Service Pager: [phone]  Patient name: [name] [name] Medical record number: [id number] Date of birth: [birth year] Age: 49 y.o. Gender: female  Primary Care Provider: Conni Slipper, MD Consultants: None Code Status: Full  Chief Complaint: Nausea / Vomiting, Right back / abdominal pain.   Assessment and Plan: [name] [name] is a 49 y.o. female presenting with concern for pyelonephritis . PMH is significant for DMII, Gastroparesis, GERD, Anxiety, Depression, Cyclic vomiting Syndrome, CKD III  Pyelonephritis - pt. Here with right flank pain, nausea, and vomiting starting last night. Suprapubic pressure and dysuria. Low grade fever in the Ed 100.7. Slightly tachycardic. CT abd with findings on the right consistent with pyelonephritis. CVA tenderness and right side tenderness to exam. WBC is 10 with left shift. - Admit to med surg, vitals per unit - Follow up urine culture and blood culture (bld cx after abx) - Ceftriaxone IV 1g q24hr. Consider switching to po cipro vs. 3rd gen Ceph. When tolerating PO and UCx sensitivities return  - Zofran prn, Reglan for Gastroparesis and nausea.  - Dilaudid pain control   AKI on CKD III - Probable dehydration baseline creatinine around 2 - NS while decreased PO  - Trend BMET.  - Avoiding nephrotoxic meds  Migraine -  On sumitriptan at home. No headache at this time. Continue to monitor.  - Will attempt tylenol, but avoiding nephrotoxic meds as above.  - Sumitriptan if migraine arises.   Anxiety / Depression  - pt. Not currently anxious or depressed. Given ativan in the ED with some success.  - Will continue Lyrica here.  - Ativan 0.5mg  BID prn anxiety.  - On Ativan 1mg  BID at home.   HTN -   - Continue home meds - Coreg, Lisinopril  DMII  - on Lantus at home 20 u. Last A1C 7.2 - CBG's here 384.  - Monitor CBG's qam qhs - Sensitive SSI.   Gastroparesis  - longstanding DM.  Here with nausea and vomiting mostly related to pain.  - Continue home reglan in addition to zofran for nausea.  - Consider addition of phenergan if still having nausea   Cyclic Vomiting Syndrome - pt. Feels that this episode is not related to her normal problems with nausea and vomiting, and that it is mostly related to the pain .   FEN/GI: NS @ 100 cc/hr, clears ADAT Prophylaxis: Sub q heparin.   Disposition: Pending improvement and transition to PO antibiotics.   Subjective:  Doing ok this AM, still having nausea but denies fevers.    Objective: BP 108/56 mmHg  Pulse 75  Temp(Src) 98.4 F (36.9 C) (Oral)  Resp 17  SpO2 100% Exam: General: NAD, AAOx3 HEENT: NCAT, PERRLA, EOMI, MMM Cardiovascular: RRR, No MGR, Normal S1/S2, 2+ distal pulses Respiratory: CTA Bilaterally, appropriate rate, unlabored.  Abdomen: S, Mild TTP over right side. No guarding, no rebound, +BS, Nondistended, No organomegally, fairly significant CVA tenderness on the right side noted.  Skin: No lesions, no rashes.  Neuro: No gross deficits   Labs and Imaging: CBC BMET   Recent Labs Lab 02/01/15 0531  WBC 11.6*  HGB 8.6*  HCT 24.7*  PLT PENDING    Recent Labs Lab 02/01/15 0531  NA 134*  K 3.8  CL 106  CO2 23  BUN 39*  CREATININE 2.27*  GLUCOSE 104*  CALCIUM 7.5*     Urinalysis    Component Value Date/Time   COLORURINE YELLOW 01/31/2015 0924  APPEARANCEUR TURBID* 01/31/2015 0924   LABSPEC 1.014 01/31/2015 0924   PHURINE 6.0 01/31/2015 0924   GLUCOSEU 500* 01/31/2015 0924   HGBUR LARGE* 01/31/2015 0924   HGBUR small 10/19/2010 0852   BILIRUBINUR NEGATIVE 01/31/2015 0924   KETONESUR NEGATIVE 01/31/2015 0924   PROTEINUR 100* 01/31/2015 0924   UROBILINOGEN 0.2 01/31/2015 0924   NITRITE NEGATIVE 01/31/2015 0924   LEUKOCYTESUR MODERATE* 01/31/2015 0924   Micro:   Urine Culture 2/5 - pending Blood Culture 2/5 (taken after Ceftriaxone started) - pending  Imaging:   CT  Abd/Pelvis - 2/5:  IMPRESSION: 1. No urolithiasis. Bilateral perinephric stranding, right worse than left with mild right hydroureteronephrosis. Small amount of right perinephric fluid. These findings can be seen in the setting of pyelonephritis. Correlate with urinalysis. 2. Mild bladder wall thickening which may be secondary to underdistention versus cystitis. 3. Mildly enlarged right inguinal lymph node which is likely reactive.   Nolon Rod, DO 02/01/2015, 6:51 AM PGY-3, [place] Intern pager: [phone], text pages welcome

## 2015-02-02 LAB — BASIC METABOLIC PANEL
ANION GAP: 5 (ref 5–15)
BUN: 25 mg/dL — ABNORMAL HIGH (ref 6–23)
CO2: 22 mmol/L (ref 19–32)
Calcium: 8 mg/dL — ABNORMAL LOW (ref 8.4–10.5)
Chloride: 110 mmol/L (ref 96–112)
Creatinine, Ser: 1.85 mg/dL — ABNORMAL HIGH (ref 0.50–1.10)
GFR calc Af Amer: 36 mL/min — ABNORMAL LOW (ref >90)
GFR calc non Af Amer: 31 mL/min — ABNORMAL LOW (ref >90)
Glucose, Bld: 53 mg/dL — ABNORMAL LOW (ref 70–99)
POTASSIUM: 3.7 mmol/L (ref 3.5–5.1)
Sodium: 137 mmol/L (ref 135–145)

## 2015-02-02 LAB — CBC
HCT: 24.2 % — ABNORMAL LOW (ref 36.0–46.0)
Hemoglobin: 8.4 g/dL — ABNORMAL LOW (ref 12.0–15.0)
MCH: 31.2 pg (ref 26.0–34.0)
MCHC: 34.7 g/dL (ref 30.0–36.0)
MCV: 90 fL (ref 78.0–100.0)
Platelets: 116 10*3/uL — ABNORMAL LOW (ref 150–400)
RBC: 2.69 MIL/uL — ABNORMAL LOW (ref 3.87–5.11)
RDW: 14.2 % (ref 11.5–15.5)
WBC: 8.1 10*3/uL (ref 4.0–10.5)

## 2015-02-02 LAB — URINE CULTURE: Colony Count: >=100000

## 2015-02-02 LAB — GLUCOSE, CAPILLARY
GLUCOSE-CAPILLARY: 106 mg/dL — AB (ref 70–99)
Glucose-Capillary: 68 mg/dL — ABNORMAL LOW (ref 70–99)
Glucose-Capillary: 92 mg/dL (ref 70–99)
Glucose-Capillary: 95 mg/dL (ref 70–99)
Glucose-Capillary: 149 mg/dL — ABNORMAL HIGH (ref 70–99)

## 2015-02-02 MED ORDER — DIPHENHYDRAMINE HCL 25 MG PO CAPS
25.0000 mg | ORAL_CAPSULE | Freq: Four times a day (QID) | ORAL | Status: DC | PRN
  Administered 2015-02-03: 25 mg via ORAL
  Filled 2015-02-02 (×2): qty 1

## 2015-02-02 MED ORDER — PROMETHAZINE HCL 25 MG PO TABS
25.0000 mg | ORAL_TABLET | Freq: Four times a day (QID) | ORAL | Status: DC | PRN
  Administered 2015-02-02 – 2015-02-03 (×2): 25 mg via ORAL
  Filled 2015-02-02 (×2): qty 1

## 2015-02-02 MED ORDER — HYDROMORPHONE HCL 1 MG/ML IJ SOLN
0.5000 mg | INTRAMUSCULAR | Status: DC | PRN
  Administered 2015-02-02 (×3): 1 mg via INTRAVENOUS
  Filled 2015-02-02 (×3): qty 1

## 2015-02-02 NOTE — Progress Notes (Signed)
Family Medicine Teaching Service Daily Progress Note Intern Pager: [phone]  Patient name: [name] [name] Medical record number: [id number] Date of birth: January 01, 1966 Age: 49 y.o. Gender: female  Primary Care Provider: Conni Slipper, MD Consultants: none Code Status: FULL  Pt Overview and Major Events to Date:  2/5: admitted for pyelonephritis, on Ceftriaxone 2/6: continued pain, N/V 2/7: pain slowly improving but PO meds not helping; urine culture with 100k CFU/mL, speciation pending  Assessment and Plan: [name] [name] is a 49 y.o. female presenting with concern for pyelonephritis . PMH is significant for DMII, Gastroparesis, GERD, Anxiety, Depression, Cyclic vomiting Syndrome, CKD III  Pyelonephritis - Right flank pain, nausea, and vomiting with suprapubic pressure and dysuria, findings on UA and urine culture pending speciation for 100k CFU/mL. Low grade fever in the Ed 100.7 and CT abd/pelvis with findings on the right consistent with pyelonephritis. - Ceftriaxone IV 1g q24h, switch to PO based on urine sensitivities - Zofran prn, Reglan for Gastroparesis and nausea.  - continue Dilaudid for pain control 1 mg q3 with oxycodone 5 mg q6 PO; transition to PO only as able  AKI on CKD III - Probable dehydration baseline creatinine around 2, improving slowly (1.85 on 2/7) - continue IVF and trend BMP and avoiding nephrotoxic meds (we ARE continuing linsinopril for BP)  Migraine -  On sumitriptan at home. No headache at this time. Continue to monitor.   - consider sumitriptan if migraine arises  Anxiety / Depression  - no active symptoms other than occasional bursts of anxiety with pain. Given ativan in the ED with some success.  - holding Lyrica (takes 25 mg qHS PRN, ?more for neuropathy) - ordered for Ativan 1 mg BID PRN for anxiety (home dose)  HTN -  Mostly well-controlled, intermittent elevation with pain - Continue home meds (Coreg 25 mg BID, lisinopril 40 mg  daily)  DMII  - on Lantus 20 units daily at home. Last A1C 7.2 Sept 2015 - CBG's here variable (low of 53 2/7 AM but rechecked at 100, not recorded yet) - Continue home Lantus and monitor CBG's with sensitive SSI  Gastroparesis  - long-standing DM. Here with nausea and vomiting mostly related to pain.  - Continue home reglan in addition to Zofran PRN for nausea.  - Consider addition of Phenergan if still having nausea   Cyclic Vomiting Syndrome - current nausea related to pain as above; cyclic vomiting symptoms not currently active - continue as above  FEN/GI: NS @ 100 mL/h, clear diet and advance as tolerated Prophylaxis: Sub q heparin.   Disposition: Management as above; possible discharge home 2/8 pending PO tolerance and appropriate transition to PO pain medication and abx based on urine culture  Subjective:  Reports she feels "so so," this morning, though feels like PO pain medication is "not helping." Appetite is still poor and vomiting resolved, though nausea is still "coming in waves." Denies frank chest pain, SOB. Does endorse right flank / back pain.  Objective: BP 141/65 mmHg  Pulse 70  Temp(Src) 98.5 F (36.9 C) (Oral)  Resp 16  Ht 5\' 4"  (1.626 m)  Wt 159 lb 2.8 oz (72.2 kg)  BMI 27.31 kg/m2  SpO2 100% Exam: General: adult female in NAD but does appear uncomfortable HEENT: NCAT, PERRLA, EOMI, MMM Cardiovascular: RRR, no murmur appreciated Respiratory: CTAB, normal WOB Abdomen: soft, mild tenderness over right side and tenderness in right CVA with light touch; no guarding / rebound, +BS Skin: No lesions, no rashes.  Neuro: No gross deficits   Labs and Imaging:  Recent Labs Lab 01/31/15 0913 02/01/15 0531 02/02/15 0500  WBC 10.0 11.6* 8.1  HGB 10.1* 8.6* 8.4*  HCT 29.5* 24.7* 24.2*  PLT 142* 114* 116*    Recent Labs Lab 01/31/15 0913 02/01/15 0531 02/02/15 0500  NA 133* 134* 137  K 3.8 3.8 3.7  CL 100 106 110  CO2 23 23 22   GLUCOSE 384* 104* 53*   BUN 44* 39* 25*  CREATININE 2.50* 2.27* 1.85*  CALCIUM 9.0 7.5* 8.0*  ALKPHOS 88  --   --   AST 15  --   --   ALT 10  --   --   ALBUMIN 3.9  --   --    Urinalysis 01/31/15 Component Value   COLORURINE YELLOW   APPEARANCEUR TURBID*   LABSPEC 1.014   PHURINE 6.0   GLUCOSEU 500*   HGBUR LARGE*   HGBUR small   BILIRUBINUR NEGATIVE   KETONESUR NEGATIVE   PROTEINUR 100*   UROBILINOGEN 0.2   NITRITE NEGATIVE   LEUKOCYTESUR MODERATE*   Micro:  Urine Culture 2/5 - >100k CFU / mL, speciation / sensitivity pending Blood Culture 2/5 (taken after Ceftriaxone started) - NGTD  Imaging:  CT Abd/Pelvis - 2/5: No urolithiasis. Bilateral perinephric stranding R > L, mild right hydroureteronephrosis with small amount of right perinephric fluid. Mild bladder wall thickening. Mildly enlarged likely reactive right inguinal lymph node.  Emmaline Kluver, MD 02/02/2015, 10:44 AM PGY-3, [place] Intern pager: [phone], text pages welcome

## 2015-02-02 NOTE — Progress Notes (Signed)
Utilization review completed.  

## 2015-02-03 DIAGNOSIS — R112 Nausea with vomiting, unspecified: Secondary | ICD-10-CM

## 2015-02-03 LAB — CBC
HCT: 24 % — ABNORMAL LOW (ref 36.0–46.0)
Hemoglobin: 8.5 g/dL — ABNORMAL LOW (ref 12.0–15.0)
MCH: 31.8 pg (ref 26.0–34.0)
MCHC: 35.4 g/dL (ref 30.0–36.0)
MCV: 89.9 fL (ref 78.0–100.0)
Platelets: 125 10*3/uL — ABNORMAL LOW (ref 150–400)
RBC: 2.67 MIL/uL — ABNORMAL LOW (ref 3.87–5.11)
RDW: 14 % (ref 11.5–15.5)
WBC: 4.2 10*3/uL (ref 4.0–10.5)

## 2015-02-03 LAB — BASIC METABOLIC PANEL
ANION GAP: 5 (ref 5–15)
BUN: 14 mg/dL (ref 6–23)
CO2: 22 mmol/L (ref 19–32)
Calcium: 8.4 mg/dL (ref 8.4–10.5)
Chloride: 111 mmol/L (ref 96–112)
Creatinine, Ser: 1.73 mg/dL — ABNORMAL HIGH (ref 0.50–1.10)
GFR calc Af Amer: 39 mL/min — ABNORMAL LOW (ref >90)
GFR calc non Af Amer: 33 mL/min — ABNORMAL LOW (ref >90)
Glucose, Bld: 42 mg/dL — CL (ref 70–99)
Potassium: 3.7 mmol/L (ref 3.5–5.1)
Sodium: 138 mmol/L (ref 135–145)

## 2015-02-03 LAB — GLUCOSE, CAPILLARY
Glucose-Capillary: 111 mg/dL — ABNORMAL HIGH (ref 70–99)
Glucose-Capillary: 46 mg/dL — ABNORMAL LOW (ref 70–99)

## 2015-02-03 MED ORDER — HEPARIN SOD (PORK) LOCK FLUSH 100 UNIT/ML IV SOLN
500.0000 [IU] | INTRAVENOUS | Status: DC
  Filled 2015-02-03: qty 5

## 2015-02-03 MED ORDER — CEPHALEXIN 500 MG PO CAPS
500.0000 mg | ORAL_CAPSULE | Freq: Two times a day (BID) | ORAL | Status: DC
  Administered 2015-02-03: 500 mg via ORAL
  Filled 2015-02-03 (×2): qty 1

## 2015-02-03 MED ORDER — HYDROMORPHONE HCL 1 MG/ML IJ SOLN: 0.5000 mg | INTRAMUSCULAR | Status: DC | PRN

## 2015-02-03 MED ORDER — CEPHALEXIN 500 MG PO CAPS: 500.0000 mg | ORAL_CAPSULE | Freq: Four times a day (QID) | ORAL | Status: AC

## 2015-02-03 MED ORDER — HEPARIN SOD (PORK) LOCK FLUSH 100 UNIT/ML IV SOLN
500.0000 [IU] | INTRAVENOUS | Status: DC | PRN
  Administered 2015-02-03: 500 [IU]
  Filled 2015-02-03: qty 5

## 2015-02-03 NOTE — Discharge Instructions (Signed)
It is important to continue to take your antibiotics four times a day when you go home.   Please drink plenty of fluids when you go home in order to maintain your hydration and continue to flush out your kidneys.   Pyelonephritis, Adult Pyelonephritis is a kidney infection. In general, there are 2 main types of pyelonephritis:  Infections that come on quickly without any warning (acute pyelonephritis).  Infections that persist for a long period of time (chronic pyelonephritis). CAUSES  Two main causes of pyelonephritis are:  Bacteria traveling from the bladder to the kidney. This is a problem especially in pregnant women. The urine in the bladder can become filled with bacteria from multiple causes, including:  Inflammation of the prostate gland (prostatitis).  Sexual intercourse in females.  Bladder infection (cystitis).  Bacteria traveling from the bloodstream to the tissue part of the kidney. Problems that may increase your risk of getting a kidney infection include:  Diabetes.  Kidney stones or bladder stones.  Cancer.  Catheters placed in the bladder.  Other abnormalities of the kidney or ureter. SYMPTOMS   Abdominal pain.  Pain in the side or flank area.  Fever.  Chills.  Upset stomach.  Blood in the urine (dark urine).  Frequent urination.  Strong or persistent urge to urinate.  Burning or stinging when urinating. DIAGNOSIS  Your caregiver may diagnose your kidney infection based on your symptoms. A urine sample may also be taken. TREATMENT  In general, treatment depends on how severe the infection is.   If the infection is mild and caught early, your caregiver may treat you with oral antibiotics and send you home.  If the infection is more severe, the bacteria may have gotten into the bloodstream. This will require intravenous (IV) antibiotics and a hospital stay. Symptoms may include:  High fever.  Severe flank pain.  Shaking chills.  Even  after a hospital stay, your caregiver may require you to be on oral antibiotics for a period of time.  Other treatments may be required depending upon the cause of the infection. HOME CARE INSTRUCTIONS   Take your antibiotics as directed. Finish them even if you start to feel better.  Make an appointment to have your urine checked to make sure the infection is gone.  Drink enough fluids to keep your urine clear or pale yellow.  Take medicines for the bladder if you have urgency and frequency of urination as directed by your caregiver. SEEK IMMEDIATE MEDICAL CARE IF:   You have a fever or persistent symptoms for more than 2-3 days.  You have a fever and your symptoms suddenly get worse.  You are unable to take your antibiotics or fluids.  You develop shaking chills.  You experience extreme weakness or fainting.  There is no improvement after 2 days of treatment. MAKE SURE YOU:  Understand these instructions.  Will watch your condition.  Will get help right away if you are not doing well or get worse. Document Released: 12/13/2005 Document Revised: 06/13/2012 Document Reviewed: 05/19/2011 [facility] Patient Information 2015 [place], [place]. This information is not intended to replace advice given to you by your health care provider. Make sure you discuss any questions you have with your health care provider.

## 2015-02-03 NOTE — Progress Notes (Signed)
Family Medicine Teaching Service Daily Progress Note Intern Pager: [phone]  Patient name: [name] [name] Medical record number: [id number] Date of birth: Jan 27, 1966 Age: 49 y.o. Gender: female  Primary Care Provider: Conni Slipper, MD Consultants: none Code Status: FULL  Pt Overview and Major Events to Date:  2/5: admitted for pyelonephritis, on Ceftriaxone 2/6: continued pain, N/V 2/7: pain slowly improving but PO meds not helping; urine culture with 100k CFU/mL, speciation pending  Assessment and Plan: [name] [name] is a 49 y.o. female presenting with concern for pyelonephritis . PMH is significant for DMII, Gastroparesis, GERD, Anxiety, Depression, Cyclic vomiting Syndrome, CKD III  Pyelonephritis - Right flank pain, nausea, and vomiting with suprapubic pressure and dysuria, findings on UA and urine culture pending speciation for 100k CFU/mL. Low grade fever in the Ed 100.7 and CT abd/pelvis with findings on the right consistent with pyelonephritis. - Ceftriaxone IV 1g q24h, switch to PO based on urine sensitivities.  - Zofran prn, Reglan for Gastroparesis and nausea.  - continue Dilaudid for pain control 1 mg q3 with oxycodone 5 mg q6 PO; transition to PO only as able  AKI on CKD III - Probable dehydration baseline creatinine around 2, improving slowly (1.85 on 2/7) - continue IVF and trend BMP and avoiding nephrotoxic meds - Continuing lisinopril for BP.   Migraine -  On sumitriptan at home. No headache at this time. Continue to monitor.   - consider sumitriptan if migraine arises  Anxiety / Depression  - no active symptoms other than occasional bursts of anxiety with pain. Given ativan in the ED with some success.  - holding Lyrica (takes 25 mg qHS PRN, ?more for neuropathy) - ordered for Ativan 1 mg BID PRN for anxiety (home dose)  HTN -  Mostly well-controlled, intermittent elevation with pain - Continue home meds (Coreg 25 mg BID, lisinopril 40 mg daily)  DMII   - on Lantus 20 units daily at home. Last A1C 7.2 Sept 2015 - CBG's here variable (low of 53 2/7 AM but rechecked at 100, not recorded yet) - Continue home Lantus and monitor CBG's with sensitive SSI  Gastroparesis  - long-standing DM. Here with nausea and vomiting mostly related to pain.  - Continue home reglan in addition to Zofran PRN for nausea.  - Consider addition of Phenergan if still having nausea   Cyclic Vomiting Syndrome - current nausea related to pain as above; cyclic vomiting symptoms not currently active - continue as above  FEN/GI:  - Tolerating full diet  Prophylaxis: Sub q heparin.   Disposition: Home today if tolerating po.   Subjective:   Feeling somewhat better this am, though still with some significant pain. She says that she gets little relief from her pain medicine. She denies nausea, vomiting, diarrhea, fever, chills, this am. She would like to go home today ifpossible. Tolerating her diet at this time.   Objective: BP 141/65 mmHg  Pulse 70  Temp(Src) 98.5 F (36.9 C) (Oral)  Resp 16  Ht 5\' 4"  (1.626 m)  Wt 159 lb 2.8 oz (72.2 kg)  BMI 27.31 kg/m2  SpO2 100% Exam: General: NAD, AAOx3,  HEENT: NCAT, PERRLA, EOMI, MMM Cardiovascular: RRR, No MGR, Normal S1/S2. 2+ distal pulses.  Respiratory: CTAB, normal WOB, Unlabored, Rate appropriate, no wheezes, crackles, or rales.  Abdomen: S, Mild TTP over right side, ND, +BS. No guarding / rebound Extremities: Continued CVA TTP over right mid-back, though much improved from previous.  Skin: No lesions, no rashes.  Neuro: No gross deficits   Labs and Imaging:  Recent Labs Lab 02/01/15 0531 02/02/15 0500 02/03/15 0432  WBC 11.6* 8.1 4.2  HGB 8.6* 8.4* 8.5*  HCT 24.7* 24.2* 24.0*  PLT 114* 116* 125*    Recent Labs Lab 01/31/15 0913 02/01/15 0531 02/02/15 0500 02/03/15 0432  NA 133* 134* 137 138  K 3.8 3.8 3.7 3.7  CL 100 106 110 111  CO2 23 23 22 22   GLUCOSE 384* 104* 53* 42*  BUN 44* 39* 25*  14  CREATININE 2.50* 2.27* 1.85* 1.73*  CALCIUM 9.0 7.5* 8.0* 8.4  ALKPHOS 88  --   --   --   AST 15  --   --   --   ALT 10  --   --   --   ALBUMIN 3.9  --   --   --    Urinalysis 01/31/15 Component Value   COLORURINE YELLOW   APPEARANCEUR TURBID*   LABSPEC 1.014   PHURINE 6.0   GLUCOSEU 500*   HGBUR LARGE*   HGBUR small   BILIRUBINUR NEGATIVE   KETONESUR NEGATIVE   PROTEINUR 100*   UROBILINOGEN 0.2   NITRITE NEGATIVE   LEUKOCYTESUR MODERATE*   Micro:  Urine Culture 2/5 - >100k CFU / mL, E.Coli > pan sensitive.  Blood Culture 2/5 (taken after Ceftriaxone started) - NGTD  Imaging:  CT Abd/Pelvis - 2/5: No urolithiasis. Bilateral perinephric stranding R > L, mild right hydroureteronephrosis with small amount of right perinephric fluid. Mild bladder wall thickening. Mildly enlarged likely reactive right inguinal lymph node.  Paula Compton, MD  02/03/2015, 1:35 PM PGY-1, [facility] Intern pager: [phone], text pages welcome

## 2015-02-03 NOTE — Discharge Summary (Signed)
[facility] Discharge Summary  Patient name: [name] [name] Medical record number: [id number] Date of birth: 03-28-1966 Age: 49 y.o. Gender: female Date of Admission: 01/31/2015  Date of Discharge: 02/03/2015 Admitting Physician: Dickie La, MD  Primary Care Provider: Conni Slipper, MD Consultants: None  Indication for Hospitalization: Pyelonephritis  Discharge Diagnoses/Problem List:  Pyelonephritis Major Depression Tobacco Dependence Essential Hypertension GERD Insomnia Cyclic Vomiting Syndrome DM II CKD Diabetic Neuropathy Pancreatitis Anemia  Disposition: Discharge to Home  Discharge Condition: Stable  Discharge Exam:  Exam: General: NAD, AAOx3,  HEENT: NCAT, PERRLA, EOMI, MMM Cardiovascular: RRR, No MGR, Normal S1/S2. 2+ distal pulses.  Respiratory: CTAB, normal WOB, Unlabored, Rate appropriate, no wheezes, crackles, or rales.  Abdomen: S, Mild TTP over right side, ND, +BS. No guarding / rebound Extremities: Continued CVA TTP over right mid-back, though much improved from previous.  Skin: No lesions, no rashes.  Neuro: No gross deficits   Brief Hospital Course:  [name] [name] is a 49 y.o. female presenting with concern for pyelonephritis . PMH is significant for DMII, Gastroparesis, GERD, Anxiety, Depression, Cyclic vomiting Syndrome, CKD III  Pyelonephritis - Pt. Presented to the ED with 2 days of dysuria, frequency, suprapubic pressure, and back pain with onset of nausea / vomiting the day before admission. She was stable initially, however was found to be febrile to 100.7 with CBC in the ED with WBC 10 with left shift. She had a urinalysis that was consistent with infection. A CT of her abdomen revealed right renal hydroureteronephrosis as well as a small amount of fluid around the right kidney consistent with pyelonephritis. She was given pain medicine, anti-nausea medicine, and Ceftriaxone and was admitted to the floor for  observation, pain management, and IV antibiotics. She subsequently continued to improve on IV antibiotics as well as pain medicine. Her urine culture grew E.Coli that was pansensitive. On day of discharge she had been tolerating oral antibiotics and oral pain medicine. She was found to be safe for discharge to home with close follow up .   AKI on CKD III - Pt. Was initially admitted for pyelonephritis in the setting of volume loss by vomiting x 24 hours. She was found to have an AKI with Cr up to 2.5 on admission from 1.9. She was started on IV fluids and her creatinine subsequently improved to 1.74 prior to discharge. She was continued on her lisinopril during this hospitalization. She was found to be safe and stable for discharge with outpatient follow up .   Other Medical Problems Addressed During this Admission:  Anxiety / Depresion - Continued Ativan 1mg  BID prn (did not require), Held home lyrica ? Prn at home?  Hypertension - Continued home lisinopril and Coreg DMII - Continued home Lantus 20 units daily, added Sensitive SSI.  Gastroparesis - Zofran here. Continued on Reglan at discharge.  Cyclical Vomiting Syndrome - Not thought to be involved at this admission. Nausea / Vomiting resolved with treatment of pyelonephritis.   Issues for Follow Up:  1. Follow up compliance and completion of home antibiotics . Stop date 2/18 2. Follow up BMET for creatinine improvement.  3. Unsure of lyrica qhs prn? Reevaluate.   Significant Procedures: None  Significant Labs and Imaging:   Recent Labs Lab 02/01/15 0531 02/02/15 0500 02/03/15 0432  WBC 11.6* 8.1 4.2  HGB 8.6* 8.4* 8.5*  HCT 24.7* 24.2* 24.0*  PLT 114* 116* 125*    Recent Labs Lab 01/31/15 0913 02/01/15 0531 02/02/15 0500  02/03/15 0432  NA 133* 134* 137 138  K 3.8 3.8 3.7 3.7  CL 100 106 110 111  CO2 23 23 22 22   GLUCOSE 384* 104* 53* 42*  BUN 44* 39* 25* 14  CREATININE 2.50* 2.27* 1.85* 1.73*  CALCIUM 9.0 7.5* 8.0* 8.4   ALKPHOS 88  --   --   --   AST 15  --   --   --   ALT 10  --   --   --   ALBUMIN 3.9  --   --   --       Results/Tests Pending at Time of Discharge: None  Discharge Medications:    Medication List    TAKE these medications        carvedilol 25 MG tablet  Commonly known as:  COREG  Take 25 mg by mouth 2 (two) times daily with a meal.     cephALEXin 500 MG capsule  Commonly known as:  KEFLEX  Take 1 capsule (500 mg total) by mouth 4 (four) times daily.     insulin glargine 100 UNIT/ML injection  Commonly known as:  LANTUS  Inject 20 Units into the skin daily.     lisinopril 40 MG tablet  Commonly known as:  PRINIVIL,ZESTRIL  Take 40 mg by mouth daily.     LORazepam 1 MG tablet  Commonly known as:  ATIVAN  Take 1 tablet (1 mg total) by mouth 2 (two) times daily as needed for anxiety.     metoCLOPramide 10 MG tablet  Commonly known as:  REGLAN  Take 1 tablet (10 mg total) by mouth every 6 (six) hours as needed for nausea.     oxyCODONE 5 MG immediate release tablet  Commonly known as:  Oxy IR/ROXICODONE  Take one tab by mouth up to twice a day as needed for severe pain     pregabalin 25 MG capsule  Commonly known as:  LYRICA  Take 1 capsule (25 mg total) by mouth at bedtime as needed.     promethazine 25 MG suppository  Commonly known as:  PHENERGAN  Place 1 suppository (25 mg total) rectally every 6 (six) hours as needed for nausea or vomiting.     promethazine 25 MG tablet  Commonly known as:  PHENERGAN  Take 1 tablet (25 mg total) by mouth every 6 (six) hours as needed for nausea or vomiting.     SUMAtriptan 50 MG tablet  Commonly known as:  IMITREX  Take 50 mg by mouth every 2 (two) hours as needed for migraine. May repeat in 2 hours if headache persists or recurs.     Vitamin D (Ergocalciferol) 50000 UNITS Caps capsule  Commonly known as:  DRISDOL  Take 1 capsule (50,000 Units total) by mouth every 7 (seven) days. For 8 weeks, then come in for recheck       ASK your doctor about these medications        diclofenac sodium 1 % Gel  Commonly known as:  VOLTAREN  APPLY 2 GRAMS TOPICALLY 4 TIMES DAILY     metroNIDAZOLE 0.75 % gel  Commonly known as:  METROGEL  Apply 1 application topically 2 (two) times daily. For 5 days.        Discharge Instructions: Please refer to Patient Instructions section of EMR for full details.  Patient was counseled important signs and symptoms that should prompt return to medical care, changes in medications, dietary instructions, activity restrictions, and follow up appointments.  Follow-Up Appointments: Follow-up Information    Follow up with Conni Slipper, MD On 02/13/2015.   Specialty:  Family Medicine   Why:  Hospital Follow Up - 2PM   Contact information:   [place] [address] [phone]       Aquilla Hacker, MD 02/03/2015, 3:32 PM PGY-1, [place]

## 2015-02-03 NOTE — Care Management Note (Signed)
CARE MANAGEMENT NOTE 02/03/2015  Patient:  [name] [name], [name] [name]   Account Number:  [id number]  Date Initiated:  02/03/2015  Documentation initiated by:  Annaya Bangert   Subjective/Objective Assessment:   CM following for progression and d/c planning.     Action/Plan:   Pt plans to d/c to home , pt lives alone and will return to her home.   Anticipated DC Date:  02/03/2015   Anticipated DC Plan:  HOME/SELF CARE         Choice offered to / List presented to:             Status of service:  Completed, signed off Medicare Important Message given?  YES (If response is "NO", the following Medicare IM given date fields will be blank) Date Medicare IM given:  02/03/2015 Medicare IM given by:  Bartley Vuolo Date Additional Medicare IM given:   Additional Medicare IM given by:    Discharge Disposition:    Per UR Regulation:    If discussed at Long Length of Stay Meetings, dates discussed:    Comments:

## 2015-02-03 NOTE — Progress Notes (Signed)
Discharge education completed by RN. Pt and daughter received a copy of discharge paperwork and confirm understanding of follow up appointments and discharge medications. Both deny any questions at this time. Port-a-cath de-accessed by IV team. Pt will discharge from the unit via wheelchair.

## 2015-02-03 NOTE — Progress Notes (Signed)
CRITICAL VALUE ALERT  Critical value received:  42  Date of notification:  02/03/2015  Time of notification:  [id number]  Critical value read back:Yes.    Nurse who received alert:  Maggie Font   MD notified (1st page):  Teaching Services  Time of first page:  [phone]  MD notified (2nd page):  Time of second page:  Responding MD:  Teaching Services  Time MD responded:  [phone]  Comments: Glucose   Basic metabolic panel       Collected: 02/03/15 0432   Resulting lab: [id number]   Reference range: 70 - 99 mg/dL   Value: 42 (Low Panic)   Comment: REPEATED TO VERIFY  CRITICAL RESULT CALLED TO, READ BACK BY AND VERIFIED WITH:  R. HILL RN [phone] [place]

## 2015-02-03 NOTE — Progress Notes (Addendum)
Inpatient Diabetes Program Recommendations  AACE/ADA: New Consensus Statement on Inpatient Glycemic Control (2013)  Target Ranges:  Prepandial:   less than 140 mg/dL      Peak postprandial:   less than 180 mg/dL (1-2 hours)      Critically ill patients:  140 - 180 mg/dL   Results for [name] [name], [name] [name] (MRN [id number]) as of 02/03/2015 09:11  Ref. Range 02/02/2015 09:05 02/02/2015 10:14 02/02/2015 11:53 02/02/2015 18:15 02/02/2015 21:01 02/03/2015 06:53 02/03/2015 07:25  Glucose-Capillary Latest Range: 70-99 mg/dL 68 (L) 92 106 (H) 95 149 (H) 46 (L) 111 (H)  Results for [name], [name] (MRN [id number]) as of 02/03/2015 09:11  Ref. Range 02/02/2015 05:00 02/03/2015 04:32  Glucose Latest Range: 70-99 mg/dL 53 (L) 42 (LL)   Diabetes history: DM2 Outpatient Diabetes medications: Lantus 20 units daily Current orders for Inpatient glycemic control: Lantus 20 units QHS, Novolog 0-9 units TID with meals  Inpatient Diabetes Program Recommendations Insulin - Basal: Fasting glucose was 53 mg/dl on 2/7 and 42 mg/dl today. Please decrease Lantus to 14 units QHS (based on 72 kg x 0.2 units). Thanks, Barnie Alderman, RN, MSN, CCRN, CDE Diabetes Coordinator Inpatient Diabetes Program [phone] (Team Pager) [phone] (AP office) [phone] [facility] office)

## 2015-02-03 NOTE — Progress Notes (Signed)
Pt c/o pain, sores around [facility] dsg despite using dsg pt requested, sorba view.  Visible blister 1.5x1.5cm L neck. Floor RN to page MD, per pt request to D/C PAC.

## 2015-02-04 DIAGNOSIS — F331 Major depressive disorder, recurrent, moderate: Secondary | ICD-10-CM | POA: Diagnosis not present

## 2015-02-04 LAB — GLUCOSE, CAPILLARY: GLUCOSE-CAPILLARY: 105 mg/dL — AB (ref 70–99)

## 2015-02-06 ENCOUNTER — Other Ambulatory Visit: Payer: Self-pay | Admitting: *Deleted

## 2015-02-06 DIAGNOSIS — R1115 Cyclical vomiting syndrome unrelated to migraine: Secondary | ICD-10-CM

## 2015-02-07 LAB — CULTURE, BLOOD (ROUTINE X 2)
Culture: NO GROWTH
Culture: NO GROWTH

## 2015-02-07 MED ORDER — LORAZEPAM 1 MG PO TABS: 1.0000 mg | ORAL_TABLET | Freq: Two times a day (BID) | ORAL | Status: DC | PRN

## 2015-02-07 NOTE — Telephone Encounter (Addendum)
Received refill request for ativan. Last rx'ed mid-Jan 2016 #30. Please let Ms Gossman know I will make an exception and let her pick ativan rx up at front desk (because she was recently hospitalized and likely could not get in) but typically we need an appointment to discuss this medication. Rx for ativan 1mg  #30 left up front.  Hilton Sinclair, MD

## 2015-02-07 NOTE — Telephone Encounter (Signed)
Pt informed of refill of Ativan and Rx ready for pick up. Pt to schedule an appt to discuss medication.  Derl Barrow, RN

## 2015-02-11 ENCOUNTER — Telehealth: Payer: Self-pay | Admitting: Family Medicine

## 2015-02-11 DIAGNOSIS — F331 Major depressive disorder, recurrent, moderate: Secondary | ICD-10-CM | POA: Diagnosis not present

## 2015-02-11 NOTE — Telephone Encounter (Signed)
Received fax from [employer] that Medicare Appeal Number [phone] for voltaren gel was denied.  Hilton Sinclair, MD

## 2015-02-13 ENCOUNTER — Encounter: Payer: Self-pay | Admitting: Family Medicine

## 2015-02-13 ENCOUNTER — Ambulatory Visit (INDEPENDENT_AMBULATORY_CARE_PROVIDER_SITE_OTHER): Payer: Medicare Other | Admitting: Family Medicine

## 2015-02-13 VITALS — BP 119/70 | HR 73 | Temp 98.0°F | Ht 64.0 in | Wt 160.0 lb

## 2015-02-13 DIAGNOSIS — L298 Other pruritus: Secondary | ICD-10-CM | POA: Diagnosis not present

## 2015-02-13 DIAGNOSIS — N12 Tubulo-interstitial nephritis, not specified as acute or chronic: Secondary | ICD-10-CM | POA: Diagnosis not present

## 2015-02-13 DIAGNOSIS — N179 Acute kidney failure, unspecified: Secondary | ICD-10-CM

## 2015-02-13 DIAGNOSIS — N183 Chronic kidney disease, stage 3 (moderate): Secondary | ICD-10-CM

## 2015-02-13 DIAGNOSIS — N189 Chronic kidney disease, unspecified: Secondary | ICD-10-CM

## 2015-02-13 DIAGNOSIS — IMO0002 Reserved for concepts with insufficient information to code with codable children: Secondary | ICD-10-CM

## 2015-02-13 DIAGNOSIS — E559 Vitamin D deficiency, unspecified: Secondary | ICD-10-CM

## 2015-02-13 DIAGNOSIS — E1322 Other specified diabetes mellitus with diabetic chronic kidney disease: Secondary | ICD-10-CM

## 2015-02-13 DIAGNOSIS — E1329 Other specified diabetes mellitus with other diabetic kidney complication: Secondary | ICD-10-CM

## 2015-02-13 DIAGNOSIS — E1365 Other specified diabetes mellitus with hyperglycemia: Secondary | ICD-10-CM

## 2015-02-13 DIAGNOSIS — N898 Other specified noninflammatory disorders of vagina: Secondary | ICD-10-CM

## 2015-02-13 LAB — POCT URINALYSIS DIPSTICK
BILIRUBIN UA: NEGATIVE
Blood, UA: NEGATIVE
GLUCOSE UA: NEGATIVE
Ketones, UA: NEGATIVE
Leukocytes, UA: NEGATIVE
Nitrite, UA: NEGATIVE
Protein, UA: NEGATIVE
Spec Grav, UA: 1.01
Urobilinogen, UA: 0.2
pH, UA: 5.5

## 2015-02-13 LAB — POCT GLYCOSYLATED HEMOGLOBIN (HGB A1C): Hemoglobin A1C: 7.8

## 2015-02-13 MED ORDER — FLUCONAZOLE 150 MG PO TABS: 150.0000 mg | ORAL_TABLET | Freq: Once | ORAL | Status: DC

## 2015-02-13 NOTE — Progress Notes (Signed)
Subjective:   CC: Hospital follow up  HPI: Pt is here for hospital follow up. She was hospitalized at [facility] 2/5-02/03/15 for pyelonephritis and AoCKD. She was treated with ceftiraxone and discharged on PO antibiotics. Cr increased to 2.5 from 1.9 and she was given IV fluids with improvement to 1.7 by discharge. Continued lisinopril.  Since then, feeling milder 4/10 lower abdominal pressure and urgency still with mild right back pain. Denies fevers but yesterday had nausea and had had 3 days of malaise that today started to feel better. She has been trying to stay hydrated. She however vomited 3 times last night. Today is last dose of abx. Has been taking daily per pt. Urine is normal color and odor. Denies vaginal discharge, pain, or fever but has had some vaginal tingling. On presentation to hospital she had been dizzy but that has improved.  Review of Systems - Per HPI.   PMH: Reviewed Smoking status: Still smoking marijuana    Objective:  Physical Exam BP 119/70 mmHg  Pulse 73  Temp(Src) 98 F (36.7 C) (Oral)  Ht 5\' 4"  (1.626 m)  Wt 160 lb (72.576 kg)  BMI 27.45 kg/m2 GEN: NAD ABD: S/mild lower abdominal tenderness;ND BACK: No tenderness to palpation EXTR: Trace LE edema PULM: Normal effort    Assessment:     [name] [name] is a 49 y.o. female here for hospital follow up.    Plan:     # See problem list and after visit summary for problem-specific plans. - At f/u, re-evaluate lyrica ?qhs use.  Follow-up: Follow up when available for f/u of DM and Vitamin D. - Since getting blood, checked both A1c and vit D level today. - Separate visit to discuss anxiety.    Hilton Sinclair, MD [place]

## 2015-02-13 NOTE — Patient Instructions (Addendum)
Good to see you.  You should continue improving after this hospitalization. We are getting labs today and I will call if any are NOT normal. Seek immediate care if you have worsening symptoms, new fever, burning with urination, or other concerns. Otherwise, follow up with me when available for follow up of diabetes and vitamin D. We will need another visit for anxiety soon too. I have sent in diflucan to take by mouth once for your likely yeast infection. If this does not improve symptoms in a few days, take 2nd pill and if still not better, come in.  Best,  Hilton Sinclair, MD

## 2015-02-14 LAB — BASIC METABOLIC PANEL
BUN: 43 mg/dL — AB (ref 6–23)
CO2: 24 mEq/L (ref 19–32)
Calcium: 9.3 mg/dL (ref 8.4–10.5)
Chloride: 108 mEq/L (ref 96–112)
Creat: 2.34 mg/dL — ABNORMAL HIGH (ref 0.50–1.10)
Glucose, Bld: 88 mg/dL (ref 70–99)
POTASSIUM: 5.4 meq/L — AB (ref 3.5–5.3)
Sodium: 139 mEq/L (ref 135–145)

## 2015-02-14 LAB — VITAMIN D 25 HYDROXY (VIT D DEFICIENCY, FRACTURES): VIT D 25 HYDROXY: 30 ng/mL (ref 30–100)

## 2015-02-14 NOTE — Assessment & Plan Note (Signed)
AoCKI likely from pyelonephritis. Improved with fluids by discharge. Pt reports good intake. - BMET today.

## 2015-02-14 NOTE — Assessment & Plan Note (Signed)
-   Level checked today due to already collecting blood. Need visit to discuss.

## 2015-02-14 NOTE — Assessment & Plan Note (Signed)
Pyelonephritis with AKI. Pyelonephritis treated with ceftriaxone followed by keflex, last dose today, and pt reports compliance. Vitals stable and afebrile today with no abdominal tenderness. Symptoms reportedly improving though not resolved. - UA with culture today given some persistence of lowe abdominal pressure and urgency - Diflucan given vaginal "tingling" with recent abx use. Need to return for eval if no improvement in 2-3 days.

## 2015-02-15 LAB — URINE CULTURE
Colony Count: NO GROWTH
ORGANISM ID, BACTERIA: NO GROWTH

## 2015-02-16 ENCOUNTER — Telehealth: Payer: Self-pay | Admitting: Family Medicine

## 2015-02-16 DIAGNOSIS — N179 Acute kidney failure, unspecified: Secondary | ICD-10-CM

## 2015-02-16 MED ORDER — CHOLECALCIFEROL 10 MCG (400 UNIT) PO TABS: 800.0000 [IU] | ORAL_TABLET | Freq: Every day | ORAL | Status: DC

## 2015-02-16 NOTE — Telephone Encounter (Signed)
Error

## 2015-02-16 NOTE — Telephone Encounter (Addendum)
Called to discuss labs with patient.  Creatinine to 2.3 from 1.7.  - Hold lisinopril, make appt Tues or Wed for lab-only to check BMET. Stay more hydrated than baseline; drink 6-8 glasses water daily.  - If still elevated then, will notify pt's nephrologist Dr Joelyn Oms.  UA and culture negative.  Vit D up from 12 to 30 after once weekly 50,000 IU ergocalciferol. - Switch to cholecalciferol (D3) 800 IU daily x 2 months and then recheck.  A1c 7.8 which we will discuss at future visit.  Pt stated she has also not been feeling great and is having difficulty sleeping and coughing lots. Was breathing easily on phone and able to complete sentences.  - Make sameday appt tomorrow if still feeling unwell.   Hilton Sinclair, MD

## 2015-02-18 ENCOUNTER — Telehealth: Payer: Self-pay | Admitting: Family Medicine

## 2015-02-18 ENCOUNTER — Other Ambulatory Visit: Payer: Medicare Other

## 2015-02-18 DIAGNOSIS — N179 Acute kidney failure, unspecified: Secondary | ICD-10-CM | POA: Diagnosis not present

## 2015-02-18 DIAGNOSIS — E559 Vitamin D deficiency, unspecified: Secondary | ICD-10-CM

## 2015-02-18 LAB — BASIC METABOLIC PANEL
BUN: 31 mg/dL — AB (ref 6–23)
CHLORIDE: 107 meq/L (ref 96–112)
CO2: 22 mEq/L (ref 19–32)
Calcium: 9.4 mg/dL (ref 8.4–10.5)
Creat: 2.13 mg/dL — ABNORMAL HIGH (ref 0.50–1.10)
GLUCOSE: 97 mg/dL (ref 70–99)
POTASSIUM: 6.4 meq/L — AB (ref 3.5–5.3)
Sodium: 137 mEq/L (ref 135–145)

## 2015-02-18 NOTE — Telephone Encounter (Signed)
Outside Line Call  Received call from [facility] with critical value of K 6.4 from lab draw this morning. Reviewed recent labs and notes; pt recently admitted and labs drawn today to f/u Cr. Called pt to discuss and recommended she come into the hospital or to the ED tonight to be evaluated for continued kidney failure. She states overall she feels somewhat better, is continuing to drink "plenty of water" and is holding her lisinopril. She does still have some loose stools. Pt states she "can't come to the hospital tonight" and does not want to be readmitted, but was agreeable to come into clinic for a SDA tomorrow. Appointment scheduled with Dr. Darene Lamer at 2 PM tomorrow.  Also discussed with Dr. Ree Kida and Dr. Darene Lamer; considered calling in a dose of Kayexalate tonight in order to bring down K at least, then see her in clinic tomorrow, but on attempt to call pt back to discuss this, she did not answer the phone. Will plan for pt to be seen in clinic, tomorrow, and will answer any acute questions tonight; pt voiced understanding on first call above of how to reach the emergency line.  Emmaline Kluver, MD PGY-3, [facility] Medicine 02/18/2015, 10:16 PM

## 2015-02-18 NOTE — Telephone Encounter (Signed)
Opened in error

## 2015-02-18 NOTE — Progress Notes (Signed)
[facility] phlebotomist drew:  Attempted but unsuccessful.  Then Farrell Broerman [place] drew BMP & Vit D

## 2015-02-19 ENCOUNTER — Ambulatory Visit (INDEPENDENT_AMBULATORY_CARE_PROVIDER_SITE_OTHER): Payer: Medicare Other | Admitting: Family Medicine

## 2015-02-19 ENCOUNTER — Other Ambulatory Visit (HOSPITAL_COMMUNITY)
Admission: RE | Admit: 2015-02-19 | Discharge: 2015-02-19 | Disposition: A | Discharge status: Home / Self Care | Payer: Medicare Other | Source: Other Acute Inpatient Hospital | Attending: Family Medicine | Admitting: Family Medicine

## 2015-02-19 ENCOUNTER — Encounter: Payer: Self-pay | Admitting: Family Medicine

## 2015-02-19 VITALS — BP 135/80 | HR 57 | Temp 98.2°F | Wt 162.0 lb

## 2015-02-19 DIAGNOSIS — E875 Hyperkalemia: Secondary | ICD-10-CM | POA: Diagnosis not present

## 2015-02-19 LAB — BASIC METABOLIC PANEL
ANION GAP: 10 (ref 5–15)
BUN: 28 mg/dL — ABNORMAL HIGH (ref 6–23)
CHLORIDE: 111 mmol/L (ref 96–112)
CO2: 20 mmol/L (ref 19–32)
Calcium: 9.7 mg/dL (ref 8.4–10.5)
Creatinine, Ser: 2.02 mg/dL — ABNORMAL HIGH (ref 0.50–1.10)
GFR calc non Af Amer: 28 mL/min — ABNORMAL LOW (ref >90)
GFR, EST AFRICAN AMERICAN: 32 mL/min — AB (ref >90)
Glucose, Bld: 88 mg/dL (ref 70–99)
POTASSIUM: 5.8 mmol/L — AB (ref 3.5–5.1)
Sodium: 141 mmol/L (ref 135–145)

## 2015-02-19 LAB — VITAMIN D 25 HYDROXY (VIT D DEFICIENCY, FRACTURES): VIT D 25 HYDROXY: 29 ng/mL — AB (ref 30–100)

## 2015-02-19 NOTE — Progress Notes (Signed)
Patient ID: INGRA CONFER, female   DOB: 1966-05-17, 49 y.o.   MRN: [id number] Subjective:   CC: Follow up hyperkalemia and AKI  HPI:   Ms Frymoyer presents to sameday clinic to f/u labs. Patient recently hospitalized for pyelonephritis and had AKI that was resolving by d/c to 1.73. On hospital follow up, Cr had increased to 2.34 (despite pt stating she has been hydrating normally and feeling a little better) and on recheck after hydrating and d/cing lisinopril had only decreased to 2.13 now with hyperkalemia to 6.4. She is not on K or spiro. No sulfa drugs. She reports feeling well besides mild chest tightness associated she thinks with indigestion given 2 c coffee/day and some bad taste in mouth.   Review of Systems - Per HPI.   PMH: Reviewed Smoking status: Everyday smoker    Objective:  Physical Exam BP 135/80 mmHg  Pulse 57  Temp(Src) 98.2 F (36.8 C) (Oral)  Wt 162 lb (73.483 kg) GEN: NAD CV: RRR, no m/r/g PULM: Normal effort EXTR: No LE edema    Assessment:     JULIANNA ZENGEL is a 49 y.o. female here for f/u of hyperkalemia and AKI.    Plan:     # See problem list and after visit summary for problem-specific plans. Also, likely GERD causing epigastric symptoms. - Cut back on coffee - return precautions reviewed.   Follow-up: Will discuss once receive pt's labwork results.   Hilton Sinclair, MD [place]

## 2015-02-19 NOTE — Assessment & Plan Note (Signed)
Unclear, possibly related to illness, dehydration, and lisinopril (off for 3 days so far). Not on K supplement or K-sparing diuretics. - Repeat BMET as stat today, stay hydrated, stay off lisinopril for now. - Discussed with Dr Joelyn Oms (pt's nephrologist) who agrees with this and holding lisinopril. If >6, would recommend dosing kayexilate and rechecking 1-2 days. Discussed with patient who would highly resist kayexilate due to prior vomiting with it. - F/u with Dr Joelyn Oms if AKI and hyperkalemia occur repeatedly. - ADDENDUM: Ordered as STAT BMET, but still pending 9:45 PM; called [facility] and they report having not received it as stat lab. Still pending at 11pm.

## 2015-02-20 ENCOUNTER — Telehealth: Payer: Self-pay | Admitting: Family Medicine

## 2015-02-20 DIAGNOSIS — E875 Hyperkalemia: Secondary | ICD-10-CM

## 2015-02-20 LAB — BASIC METABOLIC PANEL
BUN: 28 mg/dL — AB (ref 6–23)
CO2: 20 mEq/L (ref 19–32)
CREATININE: 2.02 mg/dL — AB (ref 0.50–1.10)
Calcium: 9.7 mg/dL (ref 8.4–10.5)
Chloride: 111 mEq/L (ref 96–112)
Glucose, Bld: 88 mg/dL (ref 70–99)
POTASSIUM: 5.8 meq/L — AB (ref 3.5–5.3)
Sodium: 141 mEq/L (ref 135–145)

## 2015-02-20 NOTE — Telephone Encounter (Signed)
-----   Message from Willeen Niece, MD sent at 02/20/2015  1:48 PM EST ----- Patient result came to my inbox.  Patient has 2 MRNs.  On the other MRN is a telephone note from her primary physician (Dr. Conni Slipper) who saw her in the office yesterday and this lab abnormality has been addressed.  Dalbert Mayotte, MD

## 2015-02-20 NOTE — Telephone Encounter (Signed)
[place], to avoid confusion, is there any way we can merge these 2 MRNs?  Hilton Sinclair, MD

## 2015-02-20 NOTE — Telephone Encounter (Signed)
Spoke with patient about improving hyperkalemia, today 5.8. We will not need to use kayexilate but instructed her to remain hydrated and continue to hold lisinopril for now. She is to come in M or T for lab-only appt to recheck BMET. Creatinine also continues to slowly trend down.  Hilton Sinclair, MD

## 2015-02-25 ENCOUNTER — Other Ambulatory Visit: Payer: Self-pay | Admitting: Family Medicine

## 2015-02-25 DIAGNOSIS — R1115 Cyclical vomiting syndrome unrelated to migraine: Secondary | ICD-10-CM

## 2015-02-25 NOTE — Telephone Encounter (Signed)
Pt called and needs a refill on her Ativan. Please call when ready to pick up. jw

## 2015-02-25 NOTE — Telephone Encounter (Signed)
Will forward info to Beryle Lathe to initiate merge process for this patient's charts.  Burna Forts, BSN, RN-BC

## 2015-02-26 MED ORDER — LORAZEPAM 1 MG PO TABS: 1.0000 mg | ORAL_TABLET | Freq: Two times a day (BID) | ORAL | Status: DC | PRN

## 2015-02-26 NOTE — Telephone Encounter (Signed)
Will send to MD to check status. [name],CMA

## 2015-02-26 NOTE — Addendum Note (Signed)
Addended by: Conni Slipper T on: 02/26/2015 05:31 PM   Modules accepted: Orders

## 2015-02-26 NOTE — Addendum Note (Signed)
Addended by: Conni Slipper T on: 02/26/2015 05:29 PM   Modules accepted: Orders

## 2015-02-26 NOTE — Telephone Encounter (Signed)
Pt called and is checking the status of her Ativan. She doesn't want to end up in the hospital. Can we get this done today. jw

## 2015-02-26 NOTE — Telephone Encounter (Signed)
Refilled and left up front. Request sent at 4pm yesterday when I was post-call. Please let patient know it is up front.   (Printed 3x due to printer issue.)  Hilton Sinclair, MD

## 2015-03-03 ENCOUNTER — Encounter: Payer: Self-pay | Admitting: Family Medicine

## 2015-03-03 ENCOUNTER — Ambulatory Visit (INDEPENDENT_AMBULATORY_CARE_PROVIDER_SITE_OTHER): Payer: Medicare Other | Admitting: Family Medicine

## 2015-03-03 VITALS — BP 150/78 | HR 66 | Temp 98.0°F | Ht 64.0 in | Wt 159.0 lb

## 2015-03-03 DIAGNOSIS — E875 Hyperkalemia: Secondary | ICD-10-CM | POA: Diagnosis not present

## 2015-03-03 DIAGNOSIS — J069 Acute upper respiratory infection, unspecified: Secondary | ICD-10-CM | POA: Diagnosis not present

## 2015-03-03 DIAGNOSIS — I10 Essential (primary) hypertension: Secondary | ICD-10-CM | POA: Diagnosis not present

## 2015-03-03 DIAGNOSIS — B9789 Other viral agents as the cause of diseases classified elsewhere: Principal | ICD-10-CM

## 2015-03-03 LAB — BASIC METABOLIC PANEL
BUN: 37 mg/dL — ABNORMAL HIGH (ref 6–23)
CALCIUM: 9.9 mg/dL (ref 8.4–10.5)
CO2: 21 mEq/L (ref 19–32)
Chloride: 110 mEq/L (ref 96–112)
Creat: 1.94 mg/dL — ABNORMAL HIGH (ref 0.50–1.10)
Glucose, Bld: 104 mg/dL — ABNORMAL HIGH (ref 70–99)
POTASSIUM: 5.3 meq/L (ref 3.5–5.3)
Sodium: 139 mEq/L (ref 135–145)

## 2015-03-03 NOTE — Assessment & Plan Note (Signed)
Elevated BP today, however note acute viral URI - Improved on re-check (150/78) - Remains off Lisinopril since recent hospitalization with AKI on CKD and HyperK  Plan: 1. Re-check BMET today - call with results to resume or hold Lisinopril 2. Cont Coreg 3. RTC 2-4 weeks for BP re-check after cold

## 2015-03-03 NOTE — Progress Notes (Signed)
   Subjective:    Patient ID: [name] [name], female    DOB: 1966-07-26, 49 y.o.   MRN: [id number]  Patient presents for a same day appointment.  HPI  URI / CHEST CONGESTION: - Reports that symptoms started on Saturday with sore / scratchy throat and cough with some "chest congestion", said that this feels like a "cold", also complains of "itching inside her chest", seems to be triggering cyclic vomiting syndrome with x 2 episodes emesis (NBNB). Admits allergy to Tylenol, and can't tolerate NSAIDs. - Recently hospitalized for Pyelonephritis (completed Keflex antibiotic course) - Sick contact at home with 3 yr granddaughter (URI cold symptoms) - Got flu shot this year - Admits occasional sweats, nausea, vomiting (occasional) - Denies abdominal pain, diarrhea,   CHRONIC HTN: Reports - Recently hospitalized for pyelo and Lisinopril was held due to AKI on CKD. Since has had re-check BMETs with hyperkalemia (also held Lisinopril). Still holding at this time Current Meds - Coreg 25mg  BID, (holding lisinopril 40mg  daily) Reports good compliance, took some meds today. Tolerating well, w/o complaints. Denies CP, dyspnea, HA, edema, dizziness / lightheadedness  I have reviewed and updated the following as appropriate: allergies and current medications  Social Hx: Active smoker  Review of Systems  See above HPI    Objective:   Physical Exam  BP 150/78 mmHg  Pulse 66  Temp(Src) 98 F (36.7 C) (Oral)  Ht 5\' 4"  (1.626 m)  Wt 159 lb (72.122 kg)  BMI 27.28 kg/m2  Gen - well-appearing, NAD HEENT - NCAT, PERRL, EOMI, patent nares w/o congestion, oropharynx clear, MMM Neck - supple, non-tender, no LAD Heart - RRR, no murmurs heard Lungs - CTAB, no wheezing, crackles, or rhonchi. Normal work of breathing. Ext - peripheral pulses intact +2 b/l Skin - warm, dry Neuro - awake, alert     Assessment & Plan:   See specific A&P problem list for details.

## 2015-03-03 NOTE — Patient Instructions (Signed)
Dear Sharmon Leyden, Thank you for coming in to clinic today. It was good to see you!  1. For your Cold / Chest Congestion - I think that this is a viral upper respiratory infection - should continue to improve over next 1 week. Cough may linger for 2-3 weeks. - Take Mucinex-DM (over the counter) for cough and thin secretions to clear your congestion - May try nasal saline as well - No antibiotics at this time 2. For your Blood Pressure - it was elevated initially, and seems to be improved (likely up due to illness) 3. Check labs today - and you can resume the Lisinopril - we will call you if we need you to stop it  Please schedule a follow-up appointment with Dr. Dianah Field in 2 to 4 weeks to follow-up BP  If you have any other questions or concerns, please feel free to call the clinic to contact me. You may also schedule an earlier appointment if necessary.  However, if your symptoms get significantly worse, please go to the Emergency Department to seek immediate medical attention.  Nobie Putnam, [place]

## 2015-03-03 NOTE — Assessment & Plan Note (Signed)
Consistent with viral URI x 3 days (+sick contact, granddaughter URI). No focal findings of infection on exam, lungs clear, afebrile  Plan: 1. Recommended OTC Mucinex-DM for chest congestion up to 7 days 2. Can try coricidin for cough/cold in setting HTN 3. Supportive measures, inc hydration 4. Continue current regimen for cyclic vomiting syndrome to avoid flare - no changes in therapy 5. RTC 7-10 days if worsening

## 2015-03-05 ENCOUNTER — Telehealth: Payer: Self-pay | Admitting: Family Medicine

## 2015-03-05 NOTE — Telephone Encounter (Signed)
Last OV 03/03/15, reviewed lab results BMET with Cr 1.94 (from 2.02) and K 5.3 (from 5.8).  Called patient but did not reach her. LVM for patient to resume Lisinopril 40mg  daily if tolerating PO well, given her Cr has returned to near her baseline and K has normalized (high normal). However, if not tolerating PO she should follow-up sooner for re-evaluation prior to resuming Lisinopril.  Recommended follow-up at [facility] in 1-2 weeks for re-check BMET to monitor after resuming Lisinopril.  If patient calls back, please relay this information to her. Thank you.  Nobie Putnam, [place], PGY-2

## 2015-03-05 NOTE — Telephone Encounter (Signed)
Thank you Dr Raliegh Ip.  Hilton Sinclair, MD

## 2015-03-06 ENCOUNTER — Other Ambulatory Visit: Payer: Self-pay | Admitting: Family Medicine

## 2015-03-24 ENCOUNTER — Encounter (HOSPITAL_COMMUNITY): Payer: Self-pay | Admitting: *Deleted

## 2015-03-24 ENCOUNTER — Emergency Department (HOSPITAL_COMMUNITY)
Admission: EM | Admit: 2015-03-24 | Discharge: 2015-03-25 | Disposition: A | Discharge status: Home / Self Care | Payer: Medicare Other | Attending: Emergency Medicine | Admitting: Emergency Medicine

## 2015-03-24 DIAGNOSIS — Z79899 Other long term (current) drug therapy: Secondary | ICD-10-CM | POA: Insufficient documentation

## 2015-03-24 DIAGNOSIS — N183 Chronic kidney disease, stage 3 (moderate): Secondary | ICD-10-CM | POA: Insufficient documentation

## 2015-03-24 DIAGNOSIS — Z9889 Other specified postprocedural states: Secondary | ICD-10-CM | POA: Diagnosis not present

## 2015-03-24 DIAGNOSIS — Z792 Long term (current) use of antibiotics: Secondary | ICD-10-CM | POA: Diagnosis not present

## 2015-03-24 DIAGNOSIS — F329 Major depressive disorder, single episode, unspecified: Secondary | ICD-10-CM | POA: Insufficient documentation

## 2015-03-24 DIAGNOSIS — Z720 Tobacco use: Secondary | ICD-10-CM | POA: Diagnosis not present

## 2015-03-24 DIAGNOSIS — E860 Dehydration: Secondary | ICD-10-CM | POA: Diagnosis not present

## 2015-03-24 DIAGNOSIS — R110 Nausea: Secondary | ICD-10-CM | POA: Diagnosis not present

## 2015-03-24 DIAGNOSIS — E1140 Type 2 diabetes mellitus with diabetic neuropathy, unspecified: Secondary | ICD-10-CM | POA: Insufficient documentation

## 2015-03-24 DIAGNOSIS — Z8701 Personal history of pneumonia (recurrent): Secondary | ICD-10-CM | POA: Insufficient documentation

## 2015-03-24 DIAGNOSIS — K219 Gastro-esophageal reflux disease without esophagitis: Secondary | ICD-10-CM | POA: Diagnosis not present

## 2015-03-24 DIAGNOSIS — R1110 Vomiting, unspecified: Secondary | ICD-10-CM | POA: Diagnosis present

## 2015-03-24 DIAGNOSIS — R1115 Cyclical vomiting syndrome unrelated to migraine: Secondary | ICD-10-CM

## 2015-03-24 DIAGNOSIS — I129 Hypertensive chronic kidney disease with stage 1 through stage 4 chronic kidney disease, or unspecified chronic kidney disease: Secondary | ICD-10-CM | POA: Insufficient documentation

## 2015-03-24 DIAGNOSIS — Z794 Long term (current) use of insulin: Secondary | ICD-10-CM | POA: Insufficient documentation

## 2015-03-24 DIAGNOSIS — N390 Urinary tract infection, site not specified: Secondary | ICD-10-CM | POA: Diagnosis not present

## 2015-03-24 DIAGNOSIS — Z862 Personal history of diseases of the blood and blood-forming organs and certain disorders involving the immune mechanism: Secondary | ICD-10-CM | POA: Insufficient documentation

## 2015-03-24 DIAGNOSIS — R011 Cardiac murmur, unspecified: Secondary | ICD-10-CM | POA: Diagnosis not present

## 2015-03-24 DIAGNOSIS — R252 Cramp and spasm: Secondary | ICD-10-CM | POA: Insufficient documentation

## 2015-03-24 DIAGNOSIS — F419 Anxiety disorder, unspecified: Secondary | ICD-10-CM | POA: Insufficient documentation

## 2015-03-24 DIAGNOSIS — G43A0 Cyclical vomiting, not intractable: Secondary | ICD-10-CM | POA: Insufficient documentation

## 2015-03-24 DIAGNOSIS — E1143 Type 2 diabetes mellitus with diabetic autonomic (poly)neuropathy: Secondary | ICD-10-CM | POA: Diagnosis not present

## 2015-03-24 LAB — CBC WITH DIFFERENTIAL/PLATELET
BASOS ABS: 0 10*3/uL (ref 0.0–0.1)
BASOS PCT: 0 % (ref 0–1)
EOS ABS: 0.1 10*3/uL (ref 0.0–0.7)
Eosinophils Relative: 2 % (ref 0–5)
HCT: 32.9 % — ABNORMAL LOW (ref 36.0–46.0)
Hemoglobin: 11.3 g/dL — ABNORMAL LOW (ref 12.0–15.0)
LYMPHS ABS: 3.5 10*3/uL (ref 0.7–4.0)
Lymphocytes Relative: 57 % — ABNORMAL HIGH (ref 12–46)
MCH: 31.7 pg (ref 26.0–34.0)
MCHC: 34.3 g/dL (ref 30.0–36.0)
MCV: 92.2 fL (ref 78.0–100.0)
MONOS PCT: 8 % (ref 3–12)
Monocytes Absolute: 0.5 10*3/uL (ref 0.1–1.0)
NEUTROS PCT: 33 % — AB (ref 43–77)
Neutro Abs: 2 10*3/uL (ref 1.7–7.7)
Platelets: 201 10*3/uL (ref 150–400)
RBC: 3.57 MIL/uL — ABNORMAL LOW (ref 3.87–5.11)
RDW: 13.7 % (ref 11.5–15.5)
WBC: 6.1 10*3/uL (ref 4.0–10.5)

## 2015-03-24 LAB — COMPREHENSIVE METABOLIC PANEL
ALK PHOS: 77 U/L (ref 39–117)
ALT: 13 U/L (ref 0–35)
AST: 21 U/L (ref 0–37)
Albumin: 4.3 g/dL (ref 3.5–5.2)
Anion gap: 10 (ref 5–15)
BUN: 35 mg/dL — AB (ref 6–23)
CO2: 19 mmol/L (ref 19–32)
Calcium: 10.1 mg/dL (ref 8.4–10.5)
Chloride: 111 mmol/L (ref 96–112)
Creatinine, Ser: 2.23 mg/dL — ABNORMAL HIGH (ref 0.50–1.10)
GFR calc Af Amer: 28 mL/min — ABNORMAL LOW (ref >90)
GFR calc non Af Amer: 24 mL/min — ABNORMAL LOW (ref >90)
Glucose, Bld: 68 mg/dL — ABNORMAL LOW (ref 70–99)
Potassium: 4.5 mmol/L (ref 3.5–5.1)
Sodium: 140 mmol/L (ref 135–145)
Total Bilirubin: 0.7 mg/dL (ref 0.3–1.2)
Total Protein: 8.2 g/dL (ref 6.0–8.3)

## 2015-03-24 LAB — CBG MONITORING, ED: Glucose-Capillary: 61 mg/dL — ABNORMAL LOW (ref 70–99)

## 2015-03-24 LAB — LIPASE, BLOOD: Lipase: 38 U/L (ref 11–59)

## 2015-03-24 MED ORDER — SODIUM CHLORIDE 0.9 % IV BOLUS (SEPSIS)
1000.0000 mL | Freq: Once | INTRAVENOUS | Status: AC
  Administered 2015-03-25: 1000 mL via INTRAVENOUS

## 2015-03-24 MED ORDER — ONDANSETRON 4 MG PO TBDP
ORAL_TABLET | ORAL | Status: AC
  Filled 2015-03-24: qty 2

## 2015-03-24 MED ORDER — ONDANSETRON 4 MG PO TBDP: 8.0000 mg | ORAL_TABLET | Freq: Once | ORAL | Status: DC

## 2015-03-24 MED ORDER — PROMETHAZINE HCL 25 MG/ML IJ SOLN
25.0000 mg | Freq: Once | INTRAMUSCULAR | Status: AC
  Administered 2015-03-25: 25 mg via INTRAVENOUS
  Filled 2015-03-24: qty 1

## 2015-03-24 NOTE — ED Provider Notes (Signed)
CSN: [id number]     Arrival date & time 03/24/15  2032 History   First MD Initiated Contact with Patient 03/24/15 2341     This chart was scribed for Varney Biles, MD by Forrestine Him, ED Scribe. This patient was seen in room D35C/D35C and the patient's care was started 11:46 PM.   Chief Complaint  Patient presents with  . Emesis   HPI  HPI Comments: [name] [name] is a 49 y.o. female with a PMHx of cyclic vomiting syndrome, HTN, MI, GERD, CKD- stage 4, and insulin dependant DM who presents to the Emergency Department complaining of constant, ongoing vomiting x 1 day. She reports 2 episodes of vomiting since onset of symptoms. She also reports associated abdominal pain (sick to stomach feeling), nausea, and foul smelling urine-sugar smell. Last CVD flare up February 2016. Typically pt takes Ativan and Phenergan to manage symptoms. However, she is currently out of medications. PSHx includes bowel obstruction repair 13 years ago, abdominal hysterectomy in 2003, and Cholecystectomy in 1980's. + flatus and normal BM.  Past Medical History  Diagnosis Date  . PANCREATITIS 05/09/2008  . Cyclic vomiting syndrome   . Hypertension   . Complication of anesthesia     "problems waking up"  . Heart murmur   . Myocardial infarction 07/2007    "they say I've had a silent one; I don't know"  . Pneumonia   . Anemia   . Blood transfusion   . GERD (gastroesophageal reflux disease)   . Anxiety   . Depression   . Smoker   . Marijuana smoker, continuous   . NEUROPATHY, DIABETIC 02/23/2007  . Type II diabetes mellitus   . Diabetic gastroparesis     /e-chart  . Chronic kidney disease (CKD), stage III (moderate)    Past Surgical History  Procedure Laterality Date  . Port-a-cath placement  ~ 2008    right chest; "poor access; frequent hospitalizations"  . Laparotomy and lysis of adhesions    . Abdominal hysterectomy  02/2002  . Cholecystectomy  1980's  . Cardiac catheterization    . Dilation and  evacuation  X3  . Left hand surgery    . Cataract extraction w/ intraocular lens  implant, bilateral    . Colonoscopy    . Upper gi endoscopy     Family History  Problem Relation Age of Onset  . Diabetes type II Mother   . Diabetes Mother   . Hypertension Mother   . Diabetes type II Sister   . Diabetes Sister   . Hypertension Sister   . Hypertension Brother    History  Substance Use Topics  . Smoking status: Current Some Day Smoker -- 0.50 packs/day for 22 years    Types: Cigarettes  . Smokeless tobacco: Never Used  . Alcohol Use: No   OB History    No data available     Review of Systems  Constitutional: Negative for fever.  Gastrointestinal: Positive for nausea, vomiting and abdominal pain.  Genitourinary: Negative for dysuria, hematuria and flank pain.  Musculoskeletal: Negative for back pain.  Neurological: Negative for dizziness, syncope and light-headedness.  All other systems reviewed and are negative.     Allergies  Bee venom; Acetaminophen; Novolog; Other; Tegaderm ag mesh; and Erythromycin  Home Medications   Prior to Admission medications   Medication Sig Start Date End Date Taking? Authorizing Provider  carvedilol (COREG) 25 MG tablet Take 25 mg by mouth 2 (two) times daily with a meal.  Yes Historical Provider, MD  insulin glargine (LANTUS) 100 UNIT/ML injection Inject 22 Units into the skin daily.    Yes Historical Provider, MD  lisinopril (PRINIVIL,ZESTRIL) 40 MG tablet Take 40 mg by mouth daily.  02/04/15  Yes Historical Provider, MD  pregabalin (LYRICA) 25 MG capsule Take 1 capsule (25 mg total) by mouth at bedtime as needed. 01/06/15  Yes Hilton Sinclair, MD  SUMAtriptan (IMITREX) 50 MG tablet Take 50 mg by mouth every 2 (two) hours as needed for migraine. May repeat in 2 hours if headache persists or recurs.   Yes Historical Provider, MD  cephALEXin (KEFLEX) 500 MG capsule Take 1 capsule (500 mg total) by mouth 2 (two) times daily. 03/25/15    Varney Biles, MD  cholecalciferol (VITAMIN D) 400 UNITS TABS tablet Take 2 tablets (800 Units total) by mouth daily. Patient not taking: Reported on 03/25/2015 02/16/15   Hilton Sinclair, MD  fluconazole (DIFLUCAN) 150 MG tablet Take 1 tablet (150 mg total) by mouth once. 2nd tab 3 days later if needed. Patient not taking: Reported on 03/25/2015 02/13/15   Hilton Sinclair, MD  LORazepam (ATIVAN) 1 MG tablet Take 1 tablet (1 mg total) by mouth 2 (two) times daily. 03/25/15   Varney Biles, MD  metoCLOPramide (REGLAN) 10 MG tablet Take 1 tablet (10 mg total) by mouth every 6 (six) hours as needed for nausea. Patient not taking: Reported on 01/31/2015 11/25/14   Lajean Saver, MD  metroNIDAZOLE (METROGEL) 0.75 % gel Apply 1 application topically 2 (two) times daily. For 5 days. Patient not taking: Reported on 01/31/2015 11/27/14   Hilton Sinclair, MD  oxyCODONE (OXY IR/ROXICODONE) 5 MG immediate release tablet Take one tab by mouth up to twice a day as needed for severe pain Patient not taking: Reported on 03/25/2015 01/06/15   Hilton Sinclair, MD  promethazine (PHENERGAN) 25 MG suppository Place 1 suppository (25 mg total) rectally every 6 (six) hours as needed for nausea. 03/25/15   Varney Biles, MD  promethazine (PHENERGAN) 25 MG tablet Take 1 tablet (25 mg total) by mouth every 6 (six) hours as needed for nausea. 03/25/15   Varney Biles, MD   Triage Vitals: BP 122/62 mmHg  Pulse 62  Temp(Src) 98.2 F (36.8 C) (Oral)  Resp 18  SpO2 100%   Physical Exam  Constitutional: She is oriented to person, place, and time. She appears well-developed and well-nourished. No distress.  HENT:  Head: Normocephalic and atraumatic.  Slightly dry mucous membranes   Eyes: EOM are normal.  Neck: Normal range of motion.  Cardiovascular: Normal rate, regular rhythm and normal heart sounds.   No murmur heard. Pulses:      Radial pulses are 2+ on the right side, and 2+ on the left side.   Capillary refill at 2 seconds  Pulmonary/Chest: Effort normal and breath sounds normal.  Lungs clear to ascultation   Abdominal: Soft. She exhibits no distension. There is no tenderness.  RUQ and epigastric tenderness; worsened in epigastrium Abdomen soft Voluntary gaurding  Musculoskeletal: Normal range of motion.  Neurological: She is alert and oriented to person, place, and time.  Skin: Skin is warm and dry.  Psychiatric: She has a normal mood and affect. Judgment normal.  Nursing note and vitals reviewed.   ED Course  Procedures (including critical care time)  DIAGNOSTIC STUDIES: Oxygen Saturation is 100% on RA, Normal by my interpretation.    COORDINATION OF CARE: 6:56 AM-Discussed treatment plan with pt at bedside  and pt agreed to plan.     Labs Review Labs Reviewed  CBC WITH DIFFERENTIAL/PLATELET - Abnormal; Notable for the following:    RBC 3.57 (*)    Hemoglobin 11.3 (*)    HCT 32.9 (*)    Neutrophils Relative % 33 (*)    Lymphocytes Relative 57 (*)    All other components within normal limits  COMPREHENSIVE METABOLIC PANEL - Abnormal; Notable for the following:    Glucose, Bld 68 (*)    BUN 35 (*)    Creatinine, Ser 2.23 (*)    GFR calc non Af Amer 24 (*)    GFR calc Af Amer 28 (*)    All other components within normal limits  URINALYSIS, ROUTINE W REFLEX MICROSCOPIC - Abnormal; Notable for the following:    APPearance CLOUDY (*)    Hgb urine dipstick TRACE (*)    Nitrite POSITIVE (*)    Leukocytes, UA SMALL (*)    All other components within normal limits  URINE MICROSCOPIC-ADD ON - Abnormal; Notable for the following:    Squamous Epithelial / LPF FEW (*)    Bacteria, UA MANY (*)    All other components within normal limits  CBG MONITORING, ED - Abnormal; Notable for the following:    Glucose-Capillary 61 (*)    All other components within normal limits  CBG MONITORING, ED - Abnormal; Notable for the following:    Glucose-Capillary 69 (*)    All  other components within normal limits  URINE CULTURE  LIPASE, BLOOD  CBG MONITORING, ED    Imaging Review No results found.   EKG Interpretation None     @5 :30 am: PO challenge passed. No emesis in the ER. Will d/c shortly. @6 :30 am: UA shows infection. Keflex given. No emesis in the ER. She has received 1 liters ivf. Pt is not septic. Strict return precautions discussed, and she is comfortable with the plan. Stressed the importance of good hydration. MDM   Final diagnoses:  UTI (lower urinary tract infection)  Non-intractable cyclical vomiting with nausea    I personally performed the services described in this documentation, which was scribed in my presence. The recorded information has been reviewed and is accurate.  Pt comes in with cc of emesis. Pt has cyclic vomiting syndrome. She also has CKD. Her CVS is flared up from infection in the past. She has no meds at home to control her sx. Pt's labs are reassuring. She has slight elevation of her Cr compared to baseline. IVF ordered.  UA ordered, to ensure there is no uti and to check for ketones. No DKA per labs. Pt has no peritoneal signs, and the current pain is similar to her previous CVS pain. Will need to pass po challenge for her to go home. Will start hydration  Varney Biles, MD 03/25/15 [phone]

## 2015-03-24 NOTE — ED Notes (Signed)
CBG recheck of 85, pt placed in lobby to wait for room

## 2015-03-24 NOTE — ED Notes (Signed)
Pt in c/o flare of her cyclical vomiting syndrome, states her symptoms started this morning, normally takes ativan and phenergan at home for this but is out, no distress noted

## 2015-03-24 NOTE — ED Notes (Signed)
Pt given sprite in triage for glucose level, will recheck

## 2015-03-25 ENCOUNTER — Encounter: Payer: Self-pay | Admitting: Family Medicine

## 2015-03-25 ENCOUNTER — Ambulatory Visit (INDEPENDENT_AMBULATORY_CARE_PROVIDER_SITE_OTHER): Payer: Medicare Other | Admitting: Family Medicine

## 2015-03-25 VITALS — BP 165/80 | HR 65 | Temp 98.3°F | Wt 153.0 lb

## 2015-03-25 DIAGNOSIS — N189 Chronic kidney disease, unspecified: Secondary | ICD-10-CM

## 2015-03-25 DIAGNOSIS — B351 Tinea unguium: Secondary | ICD-10-CM

## 2015-03-25 DIAGNOSIS — N390 Urinary tract infection, site not specified: Secondary | ICD-10-CM | POA: Insufficient documentation

## 2015-03-25 DIAGNOSIS — I10 Essential (primary) hypertension: Secondary | ICD-10-CM | POA: Diagnosis not present

## 2015-03-25 DIAGNOSIS — G8929 Other chronic pain: Secondary | ICD-10-CM

## 2015-03-25 DIAGNOSIS — M79609 Pain in unspecified limb: Secondary | ICD-10-CM

## 2015-03-25 DIAGNOSIS — G43A0 Cyclical vomiting, not intractable: Secondary | ICD-10-CM | POA: Diagnosis not present

## 2015-03-25 DIAGNOSIS — M75101 Unspecified rotator cuff tear or rupture of right shoulder, not specified as traumatic: Secondary | ICD-10-CM | POA: Diagnosis not present

## 2015-03-25 DIAGNOSIS — N3000 Acute cystitis without hematuria: Secondary | ICD-10-CM

## 2015-03-25 DIAGNOSIS — Z7189 Other specified counseling: Secondary | ICD-10-CM

## 2015-03-25 DIAGNOSIS — R1115 Cyclical vomiting syndrome unrelated to migraine: Secondary | ICD-10-CM

## 2015-03-25 HISTORY — DX: Tinea unguium: B35.1

## 2015-03-25 LAB — URINALYSIS, ROUTINE W REFLEX MICROSCOPIC
Bilirubin Urine: NEGATIVE
Glucose, UA: NEGATIVE mg/dL
Ketones, ur: NEGATIVE mg/dL
NITRITE: POSITIVE — AB
PH: 5.5 (ref 5.0–8.0)
PROTEIN: NEGATIVE mg/dL
Specific Gravity, Urine: 1.011 (ref 1.005–1.030)
UROBILINOGEN UA: 0.2 mg/dL (ref 0.0–1.0)

## 2015-03-25 LAB — CBG MONITORING, ED
GLUCOSE-CAPILLARY: 69 mg/dL — AB (ref 70–99)
Glucose-Capillary: 85 mg/dL (ref 70–99)

## 2015-03-25 LAB — URINE MICROSCOPIC-ADD ON

## 2015-03-25 MED ORDER — LORAZEPAM 2 MG/ML IJ SOLN: 2.0000 mg | Freq: Once | INTRAMUSCULAR | Status: DC

## 2015-03-25 MED ORDER — FENTANYL CITRATE 0.05 MG/ML IJ SOLN
50.0000 ug | Freq: Once | INTRAMUSCULAR | Status: AC
  Administered 2015-03-25: 50 ug via INTRAVENOUS
  Filled 2015-03-25: qty 2

## 2015-03-25 MED ORDER — CEPHALEXIN 500 MG PO CAPS: 500.0000 mg | ORAL_CAPSULE | Freq: Two times a day (BID) | ORAL | Status: DC

## 2015-03-25 MED ORDER — PROMETHAZINE HCL 25 MG RE SUPP: 25.0000 mg | Freq: Four times a day (QID) | RECTAL | Status: DC | PRN

## 2015-03-25 MED ORDER — PROMETHAZINE HCL 25 MG PO TABS: 25.0000 mg | ORAL_TABLET | Freq: Four times a day (QID) | ORAL | Status: DC | PRN

## 2015-03-25 MED ORDER — CEPHALEXIN 250 MG PO CAPS
500.0000 mg | ORAL_CAPSULE | Freq: Once | ORAL | Status: AC
  Administered 2015-03-25: 500 mg via ORAL
  Filled 2015-03-25: qty 2

## 2015-03-25 MED ORDER — HEPARIN SOD (PORK) LOCK FLUSH 100 UNIT/ML IV SOLN
500.0000 [IU] | Freq: Once | INTRAVENOUS | Status: AC
  Administered 2015-03-25: 500 [IU]
  Filled 2015-03-25: qty 5

## 2015-03-25 MED ORDER — OXYCODONE HCL 5 MG PO TABS: ORAL_TABLET | ORAL | Status: DC

## 2015-03-25 MED ORDER — LORAZEPAM 1 MG PO TABS: 1.0000 mg | ORAL_TABLET | Freq: Two times a day (BID) | ORAL | Status: DC

## 2015-03-25 MED ORDER — HYDRALAZINE HCL 10 MG PO TABS: 10.0000 mg | ORAL_TABLET | Freq: Three times a day (TID) | ORAL | Status: DC

## 2015-03-25 MED ORDER — LORAZEPAM 2 MG/ML IJ SOLN
1.0000 mg | Freq: Once | INTRAMUSCULAR | Status: AC
  Administered 2015-03-25: 1 mg via INTRAVENOUS
  Filled 2015-03-25: qty 1

## 2015-03-25 NOTE — Assessment & Plan Note (Signed)
Great toe pain is the beginning of an ingrown toenail that is very curved. No signs of infection. - REferral to podiatry ([place]) placed due to pt request and h/o diabetes.

## 2015-03-25 NOTE — ED Notes (Signed)
Pt encouraged to complete PO challenge. Pt will not stay aroused long enough to drink fluids.

## 2015-03-25 NOTE — Discharge Instructions (Signed)
You have a UTI - please take the meds prescribed for the uti along with the nausea meds. Please hydrate well.  Please return to the ER if your symptoms worsen; you have increased pain, fevers, chills, inability to keep any medications down, confusion. Otherwise see the outpatient doctor as requested.   Cyclic Vomiting Syndrome Cyclic vomiting syndrome is a benign condition in which patients experience bouts or cycles of severe nausea and vomiting that last for hours or even days. The bouts of nausea and vomiting alternate with longer periods of no symptoms and generally good health. Cyclic vomiting syndrome occurs mostly in children, but can affect adults. CAUSES  [facility] has no known cause. Each episode is typically similar to the previous ones. The episodes tend to:   Start at about the same time of day.  Last the same length of time.  Present the same symptoms at the same level of intensity. Cyclic vomiting syndrome can begin at any age in children and adults. Cyclic vomiting syndrome usually starts between the ages of 3 and 7 years. In adults, episodes tend to occur less often than they do in children, but they last longer. Furthermore, the events or situations that trigger episodes in adults cannot always be pinpointed as easily as they can in children. There are 4 phases of cyclic vomiting syndrome:  Prodrome. The prodrome phase signals that an episode of nausea and vomiting is about to begin. This phase can last from just a few minutes to several hours. This phase is often marked by belly (abdominal) pain. Sometimes taking medicine early in the prodrome phase can stop an episode in progress. However, sometimes there is no warning. A person may simply wake up in the middle of the night or early morning and begin vomiting.  Episode. The episode phase consists of:  Severe vomiting.  Nausea.  Gagging (retching).  Recovery. The recovery phase begins when the nausea and vomiting stop.  Healthy color, appetite, and energy return.  Symptom-free interval. The symptom-free interval phase is the period between episodes when no symptoms are present. TRIGGERS Episodes can be triggered by an infection or event. Examples of triggers include:  Infections.  Colds, allergies, sinus problems, and the flu.  Eating certain foods such as chocolate or cheese.  Foods with monosodium glutamate (MSG) or preservatives.  Fast foods.  Pre-packaged foods.  Foods with low nutritional value (junk foods).  Overeating.  Eating just before going to bed.  Hot weather.  Dehydration.  Not enough sleep or poor sleep quality.  Physical exhaustion.  Menstruation.  Motion sickness.  Emotional stress (school or home difficulties).  Excitement or stress. SYMPTOMS  The main symptoms of cyclic vomiting syndrome are:  Severe vomiting.  Nausea.  Gagging (retching). Episodes usually begin at night or the first thing in the morning. Episodes may include vomiting or retching up to 5 or 6 times an hour during the worst of the episode. Episodes usually last anywhere from 1 to 4 days. Episodes can last for up to 10 days. Other symptoms include:  Paleness.  Exhaustion.  Listlessness.  Abdominal pain.  Loose stools or diarrhea. Sometimes the nausea and vomiting are so severe that a person appears to be almost unconscious. Sensitivity to light, headache, fever, dizziness, may also accompany an episode. In addition, the vomiting may cause drooling and excessive thirst. Drinking water usually leads to more vomiting, though the water can dilute the acid in the vomit, making the episode a little less painful. Continuous vomiting can  lead to dehydration, which means that the body has lost excessive water and salts. DIAGNOSIS  Cyclic vomiting syndrome is hard to diagnose because there are no clear tests to identify it. A caregiver must diagnose cyclic vomiting syndrome by looking at symptoms  and medical history. A caregiver must exclude more common diseases or disorders that can also cause nausea and vomiting. Also, diagnosis takes time because caregivers need to identify a pattern or cycle to the vomiting. TREATMENT  Cyclic vomiting syndrome cannot be cured. Treatment varies, but people with cyclic vomiting syndrome should get plenty of rest and sleep and take medications that prevent, stop, or lessen the vomiting episodes and other symptoms. People whose episodes are frequent and long-lasting may be treated during the symptom-free intervals in an effort to prevent or ease future episodes. The symptom-free phase is a good time to eliminate anything known to trigger an episode. For example, if episodes are brought on by stress or excitement, this period is the time to find ways to reduce stress and stay calm. If sinus problems or allergies cause episodes, those conditions should be treated. The triggers listed above should be avoided or prevented. Because of the similarities between migraine and cyclic vomiting syndrome, caregivers treat some people with severe cyclic vomiting syndrome with drugs that are also used for migraine headaches. The drugs are designed to:  Prevent episodes.  Reduce their frequency.  Lessen their severity. HOME CARE INSTRUCTIONS Once a vomiting episode begins, treatment is supportive. It helps to stay in bed and sleep in a dark, quiet room. Severe nausea and vomiting may require hospitalization and intravenous (IV) fluids to prevent dehydration. Relaxing medications (sedatives) may help if the nausea continues. Sometimes, during the prodrome phase, it is possible to stop an episode from happening altogether. Only take over-the-counter or prescription medicines for pain, discomfort or fever as directed by your caregiver. Do not give aspirin to children. During the recovery phase, drinking water and replacing lost electrolytes (salts in the blood) are very important.  Electrolytes are salts that the body needs to function well and stay healthy. Symptoms during the recovery phase can vary. Some people find that their appetites return to normal immediately, while others need to begin by drinking clear liquids and then move slowly to solid food. RELATED COMPLICATIONS The severe vomiting that defines cyclic vomiting syndrome is a risk factor for several complications:  Dehydration--Vomiting causes the body to lose water quickly.  Electrolyte imbalance--Vomiting also causes the body to lose the important salts it needs to keep working properly.  Peptic esophagitis--The tube that connects the mouth to the stomach (esophagus) becomes injured from the stomach acid that comes up with the vomit.  Hematemesis--The esophagus becomes irritated and bleeds, so blood mixes with the vomit.  Mallory-Weiss tear--The lower end of the esophagus may tear open or the stomach may bruise from vomiting or retching.  Tooth decay--The acid in the vomit can hurt the teeth by corroding the tooth enamel. SEEK MEDICAL CARE IF: You have questions or problems. Document Released: 02/21/2002 Document Revised: 03/06/2012 Document Reviewed: 03/22/2011 [facility] Patient Information 2015 [place], [place]. This information is not intended to replace advice given to you by your health care provider. Make sure you discuss any questions you have with your health care provider.  Urinary Tract Infection Urinary tract infections (UTIs) can develop anywhere along your urinary tract. Your urinary tract is your body's drainage system for removing wastes and extra water. Your urinary tract includes two kidneys, two ureters, a  bladder, and a urethra. Your kidneys are a pair of bean-shaped organs. Each kidney is about the size of your fist. They are located below your ribs, one on each side of your spine. CAUSES Infections are caused by microbes, which are microscopic organisms, including fungi, viruses, and  bacteria. These organisms are so small that they can only be seen through a microscope. Bacteria are the microbes that most commonly cause UTIs. SYMPTOMS  Symptoms of UTIs may vary by age and gender of the patient and by the location of the infection. Symptoms in young women typically include a frequent and intense urge to urinate and a painful, burning feeling in the bladder or urethra during urination. Older women and men are more likely to be tired, shaky, and weak and have muscle aches and abdominal pain. A fever may mean the infection is in your kidneys. Other symptoms of a kidney infection include pain in your back or sides below the ribs, nausea, and vomiting. DIAGNOSIS To diagnose a UTI, your caregiver will ask you about your symptoms. Your caregiver also will ask to provide a urine sample. The urine sample will be tested for bacteria and white blood cells. White blood cells are made by your body to help fight infection. TREATMENT  Typically, UTIs can be treated with medication. Because most UTIs are caused by a bacterial infection, they usually can be treated with the use of antibiotics. The choice of antibiotic and length of treatment depend on your symptoms and the type of bacteria causing your infection. HOME CARE INSTRUCTIONS  If you were prescribed antibiotics, take them exactly as your caregiver instructs you. Finish the medication even if you feel better after you have only taken some of the medication.  Drink enough water and fluids to keep your urine clear or pale yellow.  Avoid caffeine, tea, and carbonated beverages. They tend to irritate your bladder.  Empty your bladder often. Avoid holding urine for long periods of time.  Empty your bladder before and after sexual intercourse.  After a bowel movement, women should cleanse from front to back. Use each tissue only once. SEEK MEDICAL CARE IF:   You have back pain.  You develop a fever.  Your symptoms do not begin to  resolve within 3 days. SEEK IMMEDIATE MEDICAL CARE IF:   You have severe back pain or lower abdominal pain.  You develop chills.  You have nausea or vomiting.  You have continued burning or discomfort with urination. MAKE SURE YOU:   Understand these instructions.  Will watch your condition.  Will get help right away if you are not doing well or get worse. Document Released: 09/22/2005 Document Revised: 06/13/2012 Document Reviewed: 01/21/2012 [facility] Patient Information 2015 [place], [place]. This information is not intended to replace advice given to you by your health care provider. Make sure you discuss any questions you have with your health care provider.

## 2015-03-25 NOTE — ED Notes (Signed)
Pt sleeping; awakened pt for PO challenge

## 2015-03-25 NOTE — Assessment & Plan Note (Signed)
Not due for UDS until May but is due for visit with me about this. - Filled x 1 month today - Pt needs to come back for f/u of pain visit.

## 2015-03-25 NOTE — ED Notes (Signed)
Pt sleeping; has not attempted PO challenge; reports headache has not improved

## 2015-03-25 NOTE — Assessment & Plan Note (Signed)
UTI dx at ED visit. Pt still has symptoms and had been unable to afford rx. No symptoms of pyelonephritis. - Re-rx'ed keflex and helping afford with indigent fund - Hydrate - F/u PRN

## 2015-03-25 NOTE — Progress Notes (Signed)
Patient ID: [name] [name], female   DOB: 01-16-1966, 49 y.o.   MRN: [id number] Subjective:   CC: F/u kidneys, ED visit, med refills  HPI:   F/u kidneys Creatinine recently bumped but had been slowly trending down to 1.9 on 3/9, then up yesterday to 2.23 with reported water intake 16-24 oz daily. Also with current UTI. Held lisinopril but 3/9 resumed with downtrending Cr. Reports normal urination other than symptoms listed below. Has not seen Dr Joelyn Oms (nephro) since we last talked.  F/u ED visit ED yesterday morning for UTI, unable to pick up abx rx due to cost (high [facility] copay). Still feeling suprapubic pressure, dysuria odor. Denies fever. States these symptoms flared up her cyclic vomiting and she is out of her ativan since 15th. Takes nearly daily.  Hypertension BP has been elevated last few visits, she has been on and off lisinopril due to bumps in creatinine. Norvasc makes her "sick" so she is unable to take this. Does not report chest pain or dyspnea. Has been taking coreg regularly.  Med refills Needs refill or help with affording the following: - Ativan (ran out 15th) - Oxycodone IR (ran out 10th) - Keflex for UTI - Phenergan suppository - BP medication of our choice   Review of Systems - Per HPI. Additionally, reports left great toenail pain and would like referral to [place] where she has been seen in the past.  PMH: Reviewed Smoking status: Everyday smoker    Objective:  Physical Exam BP 165/80 mmHg  Pulse 65  Temp(Src) 98.3 F (36.8 C) (Oral)  Wt 153 lb (69.4 kg) GEN: NAD CV: RRR, no m/r/g, 2+ B radial pulses PULM: CTAB, normal effort ABD: S/mild epigastric tenderness/ND, no suprapubic tenderness but pressure, NABS EXTR: No LE edema or calf tenderness; Left toenails hypertrophic and hyperpigmented with great nail very curved and becoming ingrown; no induration, purulence, erythema, or warmth; mildly tender PSYCH: Mood and affect intermittently tearful  and down     Assessment:     [name] [name] is a 49 y.o. female here for hospital follow up for UTI, kidney issues, med refill    Plan:     # See problem list and after visit summary for problem-specific plans. - Discussed med refills.   - Decided with pt that priorities for assistance affording meds from indigent fund were keflex, ativan, and hydralazine.   - Pt met with Tomasa Hosteller regarding this.  Follow-up: Follow up in 1 week for f/u of creatinine.   Hilton Sinclair, MD [place]

## 2015-03-25 NOTE — Assessment & Plan Note (Signed)
Currently in flare due to UTI per pt, moderately improved with ED visit.  - Treating UTI per above - Ativan refilled with #45, discussed we will not further increase and she needs dedicated visit for this in near future; she is responsible for making it last 1 mo - Reordered oxycodone x 1, again pt to follow up for visit to discuss - Phenergan suppository rx'ed - Stay hydrated; stop using marijuana

## 2015-03-25 NOTE — ED Notes (Signed)
Pt c/o NV; hx of cyclic vomiting syndrome. Pt reports emesis starting today. Also c/o headache; hypertension noted on assessment; has not taken blood pressure medications today.

## 2015-03-25 NOTE — Assessment & Plan Note (Signed)
Poorly controlled with goal with DM 130/90. Taking coreg daily. Unable to tolerate lisinopril x 2 days due to vomiting. Cr bump makes HCTZ and ACE bad choice right now. Pt cannot tolerate norvasc. - Hydralazine 10mg  TID - Continue coreg - Take lisinopril 1/2 tab when not in cyclic vomiting flare but hold whenever flared

## 2015-03-25 NOTE — Patient Instructions (Addendum)
For your kidney function, Drink at least 48 oz daily. Be sure to follow up in 1 week. Hold lisinopril for now and start a medication called hydralazine 3 times a day. Check BP at home and bring to follow up. Follow up with Dr Joelyn Oms.  For your UTI We have represcribed keflex. Take daily for whole course. Hydration will help.  For cyclic vomiting, I have represcribed ativan at a higher amount. It is up to you to make this l ast 1 month. I will not increase dose again. Follow up with me regularly about this. Keep off marijuana. Hold lisinopril whenever you have a flare. Otherwise, take 1/2 tab daily.  Follow up to discuss pain.   I have referred you to podiatry. If you do not hear anything in 2-3 weeks, call us back.  Best!

## 2015-03-25 NOTE — Assessment & Plan Note (Signed)
Creatinine increased yest ED visit to 2.2 (from 1.9) with poor PO due to cyclic vomiting flare and taking lisinopril. No LE edema or signs of fluid overload. - Disussed holding lisinopril for flares and otherwise switching to 1/2 tab. Adding hydralazine for BP control instead. - Hydrate better. - F/u with nephrology - F/u 1 week for repeat labs

## 2015-03-26 ENCOUNTER — Encounter: Payer: Self-pay | Admitting: Clinical

## 2015-03-26 NOTE — Progress Notes (Signed)
Late Entry for 03/25/15  Pt reported not having any money to pay for her 4 medications. CSW met with pt and informed her that we would assist with 2 medications (hydralazine $12 & keflex $10) this one time. Pt appreciative and tearful of assistance. Pt given $22 and directed to pay for those 2 medications specifically.   Hunt Oris, MSW, [place]

## 2015-03-27 LAB — URINE CULTURE

## 2015-03-28 ENCOUNTER — Telehealth (HOSPITAL_BASED_OUTPATIENT_CLINIC_OR_DEPARTMENT_OTHER): Payer: Self-pay | Admitting: Emergency Medicine

## 2015-03-28 NOTE — Telephone Encounter (Signed)
Post ED Visit - Positive Culture Follow-up  Culture report reviewed by antimicrobial stewardship pharmacist: []  Wes Dulaney, Pharm.D., BCPS []  Heide Guile, Pharm.D., BCPS []  Alycia Rossetti, Pharm.D., BCPS [x]  [place], [place].D., BCPS, AAHIVP []  Legrand Como, Pharm.D., BCPS, AAHIVP []  Isac Sarna, Pharm.D., BCPS  Positive urine culture E. Coli Treated with cephalexin, organism sensitive to the same and no further patient follow-up is required at this time.  Hazle Nordmann 03/28/2015, 11:45 AM

## 2015-03-31 ENCOUNTER — Ambulatory Visit: Payer: Self-pay | Admitting: Family Medicine

## 2015-04-09 ENCOUNTER — Ambulatory Visit: Payer: Self-pay | Admitting: Family Medicine

## 2015-04-24 ENCOUNTER — Ambulatory Visit (INDEPENDENT_AMBULATORY_CARE_PROVIDER_SITE_OTHER): Payer: Medicare Other | Admitting: Family Medicine

## 2015-04-24 ENCOUNTER — Encounter: Payer: Self-pay | Admitting: Family Medicine

## 2015-04-24 VITALS — BP 146/76 | HR 58 | Temp 99.3°F | Ht 64.0 in | Wt 154.2 lb

## 2015-04-24 DIAGNOSIS — R1115 Cyclical vomiting syndrome unrelated to migraine: Secondary | ICD-10-CM

## 2015-04-24 DIAGNOSIS — R1033 Periumbilical pain: Secondary | ICD-10-CM | POA: Diagnosis not present

## 2015-04-24 DIAGNOSIS — A599 Trichomoniasis, unspecified: Secondary | ICD-10-CM

## 2015-04-24 DIAGNOSIS — N189 Chronic kidney disease, unspecified: Secondary | ICD-10-CM

## 2015-04-24 DIAGNOSIS — G43A0 Cyclical vomiting, not intractable: Secondary | ICD-10-CM

## 2015-04-24 DIAGNOSIS — R300 Dysuria: Secondary | ICD-10-CM

## 2015-04-24 LAB — CBC
HEMATOCRIT: 30.5 % — AB (ref 36.0–46.0)
HEMOGLOBIN: 10.2 g/dL — AB (ref 12.0–15.0)
MCH: 31.2 pg (ref 26.0–34.0)
MCHC: 33.4 g/dL (ref 30.0–36.0)
MCV: 93.3 fL (ref 78.0–100.0)
MPV: 11.1 fL (ref 8.6–12.4)
Platelets: 195 10*3/uL (ref 150–400)
RBC: 3.27 MIL/uL — AB (ref 3.87–5.11)
RDW: 14.9 % (ref 11.5–15.5)
WBC: 5.5 10*3/uL (ref 4.0–10.5)

## 2015-04-24 LAB — BASIC METABOLIC PANEL
BUN: 37 mg/dL — AB (ref 6–23)
CO2: 22 mEq/L (ref 19–32)
Calcium: 9.2 mg/dL (ref 8.4–10.5)
Chloride: 103 mEq/L (ref 96–112)
Creat: 1.92 mg/dL — ABNORMAL HIGH (ref 0.50–1.10)
Glucose, Bld: 64 mg/dL — ABNORMAL LOW (ref 70–99)
POTASSIUM: 4.6 meq/L (ref 3.5–5.3)
SODIUM: 131 meq/L — AB (ref 135–145)

## 2015-04-24 LAB — POCT URINALYSIS DIPSTICK
Bilirubin, UA: NEGATIVE
Blood, UA: NEGATIVE
GLUCOSE UA: NEGATIVE
Ketones, UA: NEGATIVE
NITRITE UA: NEGATIVE
Protein, UA: NEGATIVE
Spec Grav, UA: <=1.005
UROBILINOGEN UA: 0.2
pH, UA: 6

## 2015-04-24 LAB — POCT UA - MICROSCOPIC ONLY: TRICHOMONAS UA: POSITIVE

## 2015-04-24 MED ORDER — CIPROFLOXACIN HCL 500 MG PO TABS
500.0000 mg | ORAL_TABLET | Freq: Two times a day (BID) | ORAL | Status: DC
Start: 2015-04-24 — End: 2015-06-10

## 2015-04-24 MED ORDER — LORAZEPAM 1 MG PO TABS: 1.0000 mg | ORAL_TABLET | Freq: Two times a day (BID) | ORAL | Status: DC

## 2015-04-24 MED ORDER — METRONIDAZOLE 500 MG PO TABS: 500.0000 mg | ORAL_TABLET | Freq: Three times a day (TID) | ORAL | Status: DC

## 2015-04-24 NOTE — Progress Notes (Addendum)
Addendum: Noted Trichomonas on urine micro after pt had left. Will leave Rx for Flagyl 500 mg TID with Candiss Norse, along with instructions to give to pt when she comes in tomorrow for f/u of lab results. Alternatively, pt may be given Flagyl as single 2g dose in clinic, tomorrow. --CMS

## 2015-04-24 NOTE — Patient Instructions (Addendum)
Thank you for coming in, today! I'm sorry you're not feeling well.  Your urine looks like it may be infected again. I want you to take an antibiotic that should be free at [facility]. It's called Cipro (ciprofloxacin). Take one pill twice a day for 10 days. I will refill your Ativan for your nausea. Keep taking Phenergan as needed, too. Make sure you're taking your Lantus and drink plenty of fluids, as well.  We will check some labs today and let you know what the results are. Your blood test results should be back tomorrow. Your urine culture may take a few days. If anything is very abnormal, we'll call you to tell you what to do about it. If we can't call you, you can come back to clinic and ask to get your lab results in person. If we need you to change it based on your urine culture, someone will let you know.  Come back to see Dr. Darene Lamer in a week or two. If things get much worse instead of better, come back sooner or go to the emergency room. Please feel free to call with any questions or concerns at any time, at [phone]. --Dr. Venetia Maxon

## 2015-04-24 NOTE — Addendum Note (Signed)
Addended by: Emmaline Kluver on: 04/24/2015 05:09 PM   Modules accepted: Orders

## 2015-04-24 NOTE — Progress Notes (Signed)
   Subjective:    Patient ID: [name] [name], female    DOB: September 03, 1966, 49 y.o.   MRN: [id number]  HPI: Pt presents to clinic for SDA for nausea, abdominal pain, and "generally not feeling good." She reports nausea for about 2 days as well as pressure in her bladder when finishing urinating, similar to a recent UTI (pan-sensitive E.coli). She finished Keflex a couple of weeks ago. She endorses urge and frequency with incomplete emptying.  She has a hx of diabetes and states she is compliant with Lantus 22 units but states her urine does sometimes "smell sweet." She does not check her CBG's at home regularly.  She has a history of cyclic vomiting and takes Ativan (took last one last night), Phenergan suppositories (last taken night before last), but not Zofran or Reglan as she feels these haven't worked. Her last marijuana use was about 3 days ago.  Also of note, pt was seen a few weeks ago by Dr. Dianah Field and has been off of lisinopril, but apparently had her f/u lab appointments canceled, per her report. She reports she has not been taking lisinopril but has been taking hydralazine and Coreg. She denies frank chest pain, SOB, or LE swelling.  Review of Systems: As above.     Objective:   Physical Exam BP 146/76 mmHg  Pulse 58  Temp(Src) 99.3 F (37.4 C) (Oral)  Ht 5\' 4"  (1.626 m)  Wt 154 lb 3.2 oz (69.945 kg)  BMI 26.46 kg/m2 Gen: non-toxic but uncomfortable-appearing adult female HEENT: [place]/AT, EOMI, PERRLA, MMM Neck: supple, normal ROM, no lymphadenopathy Cardio: RRR, no murmur appreciated Pulm: CTAB, no wheezes, normal WOB Abd: soft, diffusely tender in epigastrium but not suprapubically, BS+  No flank or CVA tenderness Ext: warm, well-perfused, no LE edema  UA: trace leukocyte esterase     Assessment & Plan:  49yo female with abdominal pain and some UTI-type symptoms; extensive PMH, otherwise (CKD, DM, cyclic vomiting) - overall consistent with recurrent UTI +/- component  of cyclic vomiting - urine culture obtained and Rx for Cipro 500 mg BID given with instructions to fill for free at [facility] - BMP and CBC drawn today, to eval for more serious infection and to recheck kidney function - refilled Ativan (two weeks' worth) for nausea, and strongly encouraged THC use cessation - pt encouraged to f/u with PCP Dr. Dianah Field in 1-2 weeks to see if medications need to be adjusted, further  NOTE: pt will return to clinic tomorrow in person to get blood results; she does not currently have a phone number, herself - pt provides permission to call [phone], her boyfriend Vanita Panda, with urine culture results, if necessary - she states we may tell him results or tell him to have pt call us back  Emmaline Kluver, MD PGY-3, [facility] 04/24/2015, 5:01 PM

## 2015-04-25 ENCOUNTER — Other Ambulatory Visit: Payer: Self-pay | Admitting: Family Medicine

## 2015-04-25 ENCOUNTER — Encounter: Payer: Self-pay | Admitting: *Deleted

## 2015-04-25 MED ORDER — FLUCONAZOLE 150 MG PO TABS
150.0000 mg | ORAL_TABLET | Freq: Once | ORAL | Status: DC
Start: 2015-04-25 — End: 2015-06-10

## 2015-04-25 NOTE — Progress Notes (Signed)
Will send in Rx for Diflucan. Thanks. --CMS

## 2015-04-25 NOTE — Progress Notes (Signed)
   Pt in nurse clinic today for results.  Pt informed of positive Trich in urine.  Rx for Metronidazole given #21 TID.  Precept with Dr. Lindell Noe; reviewed pt's CBC and BMP; per Dr. Lindell Noe no major changes in lab work.  Pt advised not to have sex with partner until he has been treated.  Pt request medication for yeast infection.  Pt stated she normally get a yeast infection with antibiotic treatment. Please send medication to pharmacy per pt request.   Derl Barrow, RN

## 2015-04-27 LAB — URINE CULTURE

## 2015-04-28 ENCOUNTER — Other Ambulatory Visit: Payer: Self-pay | Admitting: Family Medicine

## 2015-04-29 ENCOUNTER — Telehealth: Payer: Self-pay | Admitting: Family Medicine

## 2015-04-29 NOTE — Telephone Encounter (Signed)
Rx faxed to the [facility] on [address]. [name] [name], [name]

## 2015-04-29 NOTE — Telephone Encounter (Signed)
Pt called because the antibiotic medication that she was prescribed is making her throw up so it is not staying in her system. Is there another type we can call in. jw

## 2015-04-29 NOTE — Telephone Encounter (Signed)
Please fax Lyrica script to [facility] on [address]. Script given to white hall team. Thanks.

## 2015-04-29 NOTE — Telephone Encounter (Signed)
Will forward to Dr. Venetia Maxon since he was provider who last saw patient. Said Rueb,CMA

## 2015-04-29 NOTE — Telephone Encounter (Signed)
Called pt to discuss. Advised her that there is not a good alternative for Flagyl for Trichomonas. Advised pt to try taking it with Phenergan to help with nausea / vomiting. Offered Zofran as well, but pt declined stating Zofran "doesn't work for her." Also offered Metrogel intravaginal preparation, but pt prefers oral form with Phenergan for now. Will f/u as needed. Pt voiced understanding and thanks. --CMS

## 2015-04-30 ENCOUNTER — Encounter (HOSPITAL_COMMUNITY): Payer: Self-pay | Admitting: Neurology

## 2015-04-30 ENCOUNTER — Emergency Department (HOSPITAL_COMMUNITY)
Admission: EM | Admit: 2015-04-30 | Discharge: 2015-04-30 | Disposition: A | Discharge status: Home / Self Care | Payer: Medicare Other | Attending: Emergency Medicine | Admitting: Emergency Medicine

## 2015-04-30 DIAGNOSIS — E119 Type 2 diabetes mellitus without complications: Secondary | ICD-10-CM | POA: Insufficient documentation

## 2015-04-30 DIAGNOSIS — Z9049 Acquired absence of other specified parts of digestive tract: Secondary | ICD-10-CM | POA: Diagnosis not present

## 2015-04-30 DIAGNOSIS — F329 Major depressive disorder, single episode, unspecified: Secondary | ICD-10-CM | POA: Diagnosis not present

## 2015-04-30 DIAGNOSIS — I129 Hypertensive chronic kidney disease with stage 1 through stage 4 chronic kidney disease, or unspecified chronic kidney disease: Secondary | ICD-10-CM | POA: Diagnosis not present

## 2015-04-30 DIAGNOSIS — Z9889 Other specified postprocedural states: Secondary | ICD-10-CM | POA: Diagnosis not present

## 2015-04-30 DIAGNOSIS — Z794 Long term (current) use of insulin: Secondary | ICD-10-CM | POA: Insufficient documentation

## 2015-04-30 DIAGNOSIS — Z8719 Personal history of other diseases of the digestive system: Secondary | ICD-10-CM | POA: Diagnosis not present

## 2015-04-30 DIAGNOSIS — Z792 Long term (current) use of antibiotics: Secondary | ICD-10-CM | POA: Insufficient documentation

## 2015-04-30 DIAGNOSIS — Z8701 Personal history of pneumonia (recurrent): Secondary | ICD-10-CM | POA: Insufficient documentation

## 2015-04-30 DIAGNOSIS — Z90710 Acquired absence of both cervix and uterus: Secondary | ICD-10-CM | POA: Insufficient documentation

## 2015-04-30 DIAGNOSIS — R1110 Vomiting, unspecified: Secondary | ICD-10-CM | POA: Diagnosis present

## 2015-04-30 DIAGNOSIS — Z79899 Other long term (current) drug therapy: Secondary | ICD-10-CM | POA: Diagnosis not present

## 2015-04-30 DIAGNOSIS — Z720 Tobacco use: Secondary | ICD-10-CM | POA: Insufficient documentation

## 2015-04-30 DIAGNOSIS — F419 Anxiety disorder, unspecified: Secondary | ICD-10-CM | POA: Insufficient documentation

## 2015-04-30 DIAGNOSIS — Z862 Personal history of diseases of the blood and blood-forming organs and certain disorders involving the immune mechanism: Secondary | ICD-10-CM | POA: Diagnosis not present

## 2015-04-30 DIAGNOSIS — R112 Nausea with vomiting, unspecified: Secondary | ICD-10-CM | POA: Diagnosis not present

## 2015-04-30 DIAGNOSIS — I252 Old myocardial infarction: Secondary | ICD-10-CM | POA: Insufficient documentation

## 2015-04-30 DIAGNOSIS — R319 Hematuria, unspecified: Secondary | ICD-10-CM | POA: Diagnosis not present

## 2015-04-30 DIAGNOSIS — N183 Chronic kidney disease, stage 3 (moderate): Secondary | ICD-10-CM | POA: Diagnosis not present

## 2015-04-30 DIAGNOSIS — G43A0 Cyclical vomiting, not intractable: Secondary | ICD-10-CM | POA: Diagnosis not present

## 2015-04-30 DIAGNOSIS — R9431 Abnormal electrocardiogram [ECG] [EKG]: Secondary | ICD-10-CM | POA: Diagnosis not present

## 2015-04-30 DIAGNOSIS — R1115 Cyclical vomiting syndrome unrelated to migraine: Secondary | ICD-10-CM

## 2015-04-30 DIAGNOSIS — R011 Cardiac murmur, unspecified: Secondary | ICD-10-CM | POA: Insufficient documentation

## 2015-04-30 LAB — CBC WITH DIFFERENTIAL/PLATELET
BASOS ABS: 0 10*3/uL (ref 0.0–0.1)
Basophils Relative: 0 % (ref 0–1)
EOS ABS: 0.1 10*3/uL (ref 0.0–0.7)
Eosinophils Relative: 2 % (ref 0–5)
HCT: 31.3 % — ABNORMAL LOW (ref 36.0–46.0)
Hemoglobin: 10.5 g/dL — ABNORMAL LOW (ref 12.0–15.0)
LYMPHS ABS: 2 10*3/uL (ref 0.7–4.0)
Lymphocytes Relative: 39 % (ref 12–46)
MCH: 30.6 pg (ref 26.0–34.0)
MCHC: 33.5 g/dL (ref 30.0–36.0)
MCV: 91.3 fL (ref 78.0–100.0)
Monocytes Absolute: 0.5 10*3/uL (ref 0.1–1.0)
Monocytes Relative: 10 % (ref 3–12)
NEUTROS PCT: 49 % (ref 43–77)
Neutro Abs: 2.4 10*3/uL (ref 1.7–7.7)
PLATELETS: 187 10*3/uL (ref 150–400)
RBC: 3.43 MIL/uL — AB (ref 3.87–5.11)
RDW: 13.8 % (ref 11.5–15.5)
WBC: 5 10*3/uL (ref 4.0–10.5)

## 2015-04-30 LAB — URINALYSIS, ROUTINE W REFLEX MICROSCOPIC
Bilirubin Urine: NEGATIVE
Glucose, UA: NEGATIVE mg/dL
HGB URINE DIPSTICK: NEGATIVE
Ketones, ur: NEGATIVE mg/dL
Leukocytes, UA: NEGATIVE
Nitrite: NEGATIVE
PROTEIN: NEGATIVE mg/dL
Specific Gravity, Urine: 1.012 (ref 1.005–1.030)
UROBILINOGEN UA: 0.2 mg/dL (ref 0.0–1.0)
pH: 6 (ref 5.0–8.0)

## 2015-04-30 LAB — COMPREHENSIVE METABOLIC PANEL
ALT: 12 U/L — ABNORMAL LOW (ref 14–54)
AST: 17 U/L (ref 15–41)
Albumin: 3.7 g/dL (ref 3.5–5.0)
Alkaline Phosphatase: 69 U/L (ref 38–126)
Anion gap: 9 (ref 5–15)
BILIRUBIN TOTAL: 0.5 mg/dL (ref 0.3–1.2)
BUN: 25 mg/dL — AB (ref 6–20)
CALCIUM: 9.1 mg/dL (ref 8.9–10.3)
CHLORIDE: 109 mmol/L (ref 101–111)
CO2: 22 mmol/L (ref 22–32)
Creatinine, Ser: 2.23 mg/dL — ABNORMAL HIGH (ref 0.44–1.00)
GFR calc Af Amer: 28 mL/min — ABNORMAL LOW (ref >60)
GFR calc non Af Amer: 24 mL/min — ABNORMAL LOW (ref >60)
Glucose, Bld: 133 mg/dL — ABNORMAL HIGH (ref 70–99)
Potassium: 4.2 mmol/L (ref 3.5–5.1)
Sodium: 140 mmol/L (ref 135–145)
Total Protein: 6.7 g/dL (ref 6.5–8.1)

## 2015-04-30 LAB — LIPASE, BLOOD: Lipase: 32 U/L (ref 22–51)

## 2015-04-30 MED ORDER — HEPARIN SOD (PORK) LOCK FLUSH 100 UNIT/ML IV SOLN
500.0000 [IU] | Freq: Once | INTRAVENOUS | Status: AC
  Administered 2015-04-30: 500 [IU]
  Filled 2015-04-30 (×2): qty 5

## 2015-04-30 MED ORDER — PROMETHAZINE HCL 25 MG PO TABS
25.0000 mg | ORAL_TABLET | Freq: Four times a day (QID) | ORAL | Status: DC | PRN
Start: 2015-04-30 — End: 2015-06-10

## 2015-04-30 MED ORDER — ONDANSETRON HCL 4 MG/2ML IJ SOLN
4.0000 mg | Freq: Once | INTRAMUSCULAR | Status: DC
  Filled 2015-04-30: qty 2

## 2015-04-30 MED ORDER — LORAZEPAM 2 MG/ML IJ SOLN
1.0000 mg | Freq: Once | INTRAMUSCULAR | Status: AC
  Administered 2015-04-30: 1 mg via INTRAVENOUS
  Filled 2015-04-30: qty 1

## 2015-04-30 MED ORDER — SODIUM CHLORIDE 0.9 % IV BOLUS (SEPSIS)
1000.0000 mL | Freq: Once | INTRAVENOUS | Status: AC
  Administered 2015-04-30: 1000 mL via INTRAVENOUS

## 2015-04-30 MED ORDER — PROMETHAZINE HCL 25 MG/ML IJ SOLN
25.0000 mg | Freq: Once | INTRAMUSCULAR | Status: AC
  Administered 2015-04-30: 25 mg via INTRAVENOUS
  Filled 2015-04-30: qty 1

## 2015-04-30 NOTE — ED Notes (Signed)
Pt reports cyclic vomiting syndrome for 6 days. Is taking antibiotic for trich and uti. Is taking ativan and phenergan but has vomited them up. Pt is crying and reports RUQ abd pain.

## 2015-04-30 NOTE — ED Notes (Signed)
Pt provided cup of water. Told to sip on it.

## 2015-04-30 NOTE — Discharge Instructions (Signed)
Cyclic Vomiting Syndrome Cyclic vomiting syndrome is a benign condition in which patients experience bouts or cycles of severe nausea and vomiting that last for hours or even days. The bouts of nausea and vomiting alternate with longer periods of no symptoms and generally good health. Cyclic vomiting syndrome occurs mostly in children, but can affect adults. CAUSES  [facility] has no known cause. Each episode is typically similar to the previous ones. The episodes tend to:   Start at about the same time of day.  Last the same length of time.  Present the same symptoms at the same level of intensity. Cyclic vomiting syndrome can begin at any age in children and adults. Cyclic vomiting syndrome usually starts between the ages of 3 and 7 years. In adults, episodes tend to occur less often than they do in children, but they last longer. Furthermore, the events or situations that trigger episodes in adults cannot always be pinpointed as easily as they can in children. There are 4 phases of cyclic vomiting syndrome:  Prodrome. The prodrome phase signals that an episode of nausea and vomiting is about to begin. This phase can last from just a few minutes to several hours. This phase is often marked by belly (abdominal) pain. Sometimes taking medicine early in the prodrome phase can stop an episode in progress. However, sometimes there is no warning. A person may simply wake up in the middle of the night or early morning and begin vomiting.  Episode. The episode phase consists of:  Severe vomiting.  Nausea.  Gagging (retching).  Recovery. The recovery phase begins when the nausea and vomiting stop. Healthy color, appetite, and energy return.  Symptom-free interval. The symptom-free interval phase is the period between episodes when no symptoms are present. TRIGGERS Episodes can be triggered by an infection or event. Examples of triggers include:  Infections.  Colds, allergies, sinus problems, and the  flu.  Eating certain foods such as chocolate or cheese.  Foods with monosodium glutamate (MSG) or preservatives.  Fast foods.  Pre-packaged foods.  Foods with low nutritional value (junk foods).  Overeating.  Eating just before going to bed.  Hot weather.  Dehydration.  Not enough sleep or poor sleep quality.  Physical exhaustion.  Menstruation.  Motion sickness.  Emotional stress (school or home difficulties).  Excitement or stress. SYMPTOMS  The main symptoms of cyclic vomiting syndrome are:  Severe vomiting.  Nausea.  Gagging (retching). Episodes usually begin at night or the first thing in the morning. Episodes may include vomiting or retching up to 5 or 6 times an hour during the worst of the episode. Episodes usually last anywhere from 1 to 4 days. Episodes can last for up to 10 days. Other symptoms include:  Paleness.  Exhaustion.  Listlessness.  Abdominal pain.  Loose stools or diarrhea. Sometimes the nausea and vomiting are so severe that a person appears to be almost unconscious. Sensitivity to light, headache, fever, dizziness, may also accompany an episode. In addition, the vomiting may cause drooling and excessive thirst. Drinking water usually leads to more vomiting, though the water can dilute the acid in the vomit, making the episode a little less painful. Continuous vomiting can lead to dehydration, which means that the body has lost excessive water and salts. DIAGNOSIS  Cyclic vomiting syndrome is hard to diagnose because there are no clear tests to identify it. A caregiver must diagnose cyclic vomiting syndrome by looking at symptoms and medical history. A caregiver must exclude more common diseases  or disorders that can also cause nausea and vomiting. Also, diagnosis takes time because caregivers need to identify a pattern or cycle to the vomiting. TREATMENT  Cyclic vomiting syndrome cannot be cured. Treatment varies, but people with cyclic  vomiting syndrome should get plenty of rest and sleep and take medications that prevent, stop, or lessen the vomiting episodes and other symptoms. People whose episodes are frequent and long-lasting may be treated during the symptom-free intervals in an effort to prevent or ease future episodes. The symptom-free phase is a good time to eliminate anything known to trigger an episode. For example, if episodes are brought on by stress or excitement, this period is the time to find ways to reduce stress and stay calm. If sinus problems or allergies cause episodes, those conditions should be treated. The triggers listed above should be avoided or prevented. Because of the similarities between migraine and cyclic vomiting syndrome, caregivers treat some people with severe cyclic vomiting syndrome with drugs that are also used for migraine headaches. The drugs are designed to:  Prevent episodes.  Reduce their frequency.  Lessen their severity. HOME CARE INSTRUCTIONS Once a vomiting episode begins, treatment is supportive. It helps to stay in bed and sleep in a dark, quiet room. Severe nausea and vomiting may require hospitalization and intravenous (IV) fluids to prevent dehydration. Relaxing medications (sedatives) may help if the nausea continues. Sometimes, during the prodrome phase, it is possible to stop an episode from happening altogether. Only take over-the-counter or prescription medicines for pain, discomfort or fever as directed by your caregiver. Do not give aspirin to children. During the recovery phase, drinking water and replacing lost electrolytes (salts in the blood) are very important. Electrolytes are salts that the body needs to function well and stay healthy. Symptoms during the recovery phase can vary. Some people find that their appetites return to normal immediately, while others need to begin by drinking clear liquids and then move slowly to solid food. RELATED COMPLICATIONS The severe  vomiting that defines cyclic vomiting syndrome is a risk factor for several complications:  Dehydration--Vomiting causes the body to lose water quickly.  Electrolyte imbalance--Vomiting also causes the body to lose the important salts it needs to keep working properly.  Peptic esophagitis--The tube that connects the mouth to the stomach (esophagus) becomes injured from the stomach acid that comes up with the vomit.  Hematemesis--The esophagus becomes irritated and bleeds, so blood mixes with the vomit.  Mallory-Weiss tear--The lower end of the esophagus may tear open or the stomach may bruise from vomiting or retching.  Tooth decay--The acid in the vomit can hurt the teeth by corroding the tooth enamel. SEEK MEDICAL CARE IF: You have questions or problems. Document Released: 02/21/2002 Document Revised: 03/06/2012 Document Reviewed: 03/22/2011 [facility] Patient Information 2015 [place], [place]. This information is not intended to replace advice given to you by your health care provider. Make sure you discuss any questions you have with your health care provider. Abdominal Pain Many things can cause abdominal pain. Usually, abdominal pain is not caused by a disease and will improve without treatment. It can often be observed and treated at home. Your health care provider will do a physical exam and possibly order blood tests and X-rays to help determine the seriousness of your pain. However, in many cases, more time must pass before a clear cause of the pain can be found. Before that point, your health care provider may not know if you need more testing or further treatment.  HOME CARE INSTRUCTIONS  Monitor your abdominal pain for any changes. The following actions may help to alleviate any discomfort you are experiencing:  Only take over-the-counter or prescription medicines as directed by your health care provider.  Do not take laxatives unless directed to do so by your health care  provider.  Try a clear liquid diet (broth, tea, or water) as directed by your health care provider. Slowly move to a bland diet as tolerated. SEEK MEDICAL CARE IF:  You have unexplained abdominal pain.  You have abdominal pain associated with nausea or diarrhea.  You have pain when you urinate or have a bowel movement.  You experience abdominal pain that wakes you in the night.  You have abdominal pain that is worsened or improved by eating food.  You have abdominal pain that is worsened with eating fatty foods.  You have a fever. SEEK IMMEDIATE MEDICAL CARE IF:   Your pain does not go away within 2 hours.  You keep throwing up (vomiting).  Your pain is felt only in portions of the abdomen, such as the right side or the left lower portion of the abdomen.  You pass bloody or black tarry stools. MAKE SURE YOU:  Understand these instructions.   Will watch your condition.   Will get help right away if you are not doing well or get worse.  Document Released: 09/22/2005 Document Revised: 12/18/2013 Document Reviewed: 08/22/2013 [facility] Patient Information 2015 [place], [place]. This information is not intended to replace advice given to you by your health care provider. Make sure you discuss any questions you have with your health care provider.

## 2015-04-30 NOTE — ED Provider Notes (Signed)
CSN: [id number]     Arrival date & time 04/30/15  [id number] History   First MD Initiated Contact with Patient 04/30/15 0945     Chief Complaint  Patient presents with  . Emesis   DYNASTIE DEMAIO is a 49 y.o. female with a history of cyclic vomiting syndrome, pancreatitis, MI, GERD, diabetes, gastroparesis, chronic kidney disease and THC abuse who presents to the emergency department complaining of epigastric pain ongoing for the past 6 days and vomiting for the past 3 days. The patient reports that she feels like her cyclic vomiting syndrome is flaring up today. She reports having right upper quadrant abdominal pain that radiates to her back ongoing for the past 6 days. She currently complains of 10 out of 10 pain that feels like someone is poking her. She's had a previous cholecystectomy. Patient reports she's been vomiting intermittently for the past 3 days. She reports her vomiting is usually in the morning and improves in the afternoon. She reports vomiting twice today. She reports she's been unable to keep down her oral medications. She reports he last smoked marijuana 1 week ago. The patient reports she is currently being treated for urinary tract infection and trichomonas with Cipro and Flagyl. She reports she still been taking these medications. She reports that her urinary symptoms have resolved except for some red coloration of her urine. She reports her last bowel movement was this morning and it was normal. The patient is taking nothing for treatment today. The patient denies fevers, chills, dysuria, hematochezia, hematemesis, diarrhea, rashes, chest pain, shortness of breath, vaginal bleeding or vaginal discharge. The patient's previous abdominal surgical history includes a cholecystectomy and hysterectomy. She denies history kidney stones.   (Consider location/radiation/quality/duration/timing/severity/associated sxs/prior Treatment) HPI  Past Medical History  Diagnosis Date  . PANCREATITIS  05/09/2008  . Cyclic vomiting syndrome   . Hypertension   . Complication of anesthesia     "problems waking up"  . Heart murmur   . Myocardial infarction 07/2007    "they say I've had a silent one; I don't know"  . Pneumonia   . Anemia   . Blood transfusion   . GERD (gastroesophageal reflux disease)   . Anxiety   . Depression   . Smoker   . Marijuana smoker, continuous   . NEUROPATHY, DIABETIC 02/23/2007  . Type II diabetes mellitus   . Diabetic gastroparesis     /e-chart  . Chronic kidney disease (CKD), stage III (moderate)    Past Surgical History  Procedure Laterality Date  . Port-a-cath placement  ~ 2008    right chest; "poor access; frequent hospitalizations"  . Laparotomy and lysis of adhesions    . Abdominal hysterectomy  02/2002  . Cholecystectomy  1980's  . Cardiac catheterization    . Dilation and evacuation  X3  . Left hand surgery    . Cataract extraction w/ intraocular lens  implant, bilateral    . Colonoscopy    . Upper gi endoscopy     Family History  Problem Relation Age of Onset  . Diabetes type II Mother   . Diabetes Mother   . Hypertension Mother   . Diabetes type II Sister   . Diabetes Sister   . Hypertension Sister   . Hypertension Brother    History  Substance Use Topics  . Smoking status: Current Some Day Smoker -- 0.50 packs/day for 22 years    Types: Cigarettes  . Smokeless tobacco: Never Used  . Alcohol Use: No  OB History    No data available     Review of Systems  Constitutional: Negative for fever and chills.  HENT: Negative for congestion, ear pain and sore throat.   Eyes: Negative for visual disturbance.  Respiratory: Negative for cough, shortness of breath and wheezing.   Cardiovascular: Negative for chest pain.  Gastrointestinal: Positive for nausea, vomiting and abdominal pain. Negative for diarrhea and blood in stool.  Genitourinary: Positive for hematuria. Negative for dysuria, urgency, frequency, vaginal bleeding,  vaginal discharge and difficulty urinating.  Musculoskeletal: Positive for back pain. Negative for neck pain.  Skin: Negative for rash.  Neurological: Negative for weakness, light-headedness and headaches.      Allergies  Bee venom; Acetaminophen; Novolog; Other; Tegaderm ag mesh; and Erythromycin  Home Medications   Prior to Admission medications   Medication Sig Start Date End Date Taking? Authorizing Provider  carvedilol (COREG) 25 MG tablet Take 25 mg by mouth 2 (two) times daily with a meal.    Historical Provider, MD  cephALEXin (KEFLEX) 500 MG capsule Take 1 capsule (500 mg total) by mouth 2 (two) times daily. 03/25/15   Hilton Sinclair, MD  cholecalciferol (VITAMIN D) 400 UNITS TABS tablet Take 2 tablets (800 Units total) by mouth daily. Patient not taking: Reported on 03/25/2015 02/16/15   Hilton Sinclair, MD  ciprofloxacin (CIPRO) 500 MG tablet Take 1 tablet (500 mg total) by mouth 2 (two) times daily. 04/24/15   [place], MD  fluconazole (DIFLUCAN) 150 MG tablet Take 1 tablet (150 mg total) by mouth once. Repeat in 1 week if needed. 04/25/15   Sharon [address], MD  hydrALAZINE (APRESOLINE) 10 MG tablet Take 1 tablet (10 mg total) by mouth 3 (three) times daily. 03/25/15   Hilton Sinclair, MD  insulin glargine (LANTUS) 100 UNIT/ML injection Inject 22 Units into the skin daily.     Historical Provider, MD  lisinopril (PRINIVIL,ZESTRIL) 40 MG tablet Take 40 mg by mouth daily.  02/04/15   Historical Provider, MD  LORazepam (ATIVAN) 1 MG tablet Take 1 tablet (1 mg total) by mouth 2 (two) times daily. 04/24/15   Yell, MD  LYRICA 25 MG capsule TAKE 1 TABLET BY MOUTH AT BEDTIME AS NEEDED 04/28/15   Renee A Kuneff, DO  metoCLOPramide (REGLAN) 10 MG tablet Take 1 tablet (10 mg total) by mouth every 6 (six) hours as needed for nausea. Patient not taking: Reported on 01/31/2015 11/25/14   Lajean Saver, MD  metroNIDAZOLE (FLAGYL) 500 MG tablet Take 1 tablet  (500 mg total) by mouth 3 (three) times daily. 04/24/15   [place], MD  metroNIDAZOLE (METROGEL) 0.75 % gel Apply 1 application topically 2 (two) times daily. For 5 days. Patient not taking: Reported on 01/31/2015 11/27/14   Hilton Sinclair, MD  oxyCODONE (OXY IR/ROXICODONE) 5 MG immediate release tablet Take one tab by mouth up to twice a day as needed for severe pain 03/25/15   Hilton Sinclair, MD  promethazine (PHENERGAN) 25 MG suppository Place 1 suppository (25 mg total) rectally every 6 (six) hours as needed for nausea. 03/25/15   Hilton Sinclair, MD  promethazine (PHENERGAN) 25 MG tablet Take 1 tablet (25 mg total) by mouth every 6 (six) hours as needed for nausea or vomiting. 04/30/15   Waynetta Pean, PA-C  SUMAtriptan (IMITREX) 50 MG tablet Take 50 mg by mouth every 2 (two) hours as needed for migraine. May repeat in 2 hours if headache persists or  recurs.    Historical Provider, MD   BP 107/45 mmHg  Pulse 52  Temp(Src) 98.4 F (36.9 C) (Oral)  Resp 24  Ht 5' 1.5" (1.562 m)  Wt 154 lb (69.854 kg)  BMI 28.63 kg/m2  SpO2 99% Physical Exam  Constitutional: She appears well-developed and well-nourished. No distress.  Nontoxic appearing.  HENT:  Head: Normocephalic and atraumatic.  Mouth/Throat: Oropharynx is clear and moist. No oropharyngeal exudate.  Eyes: Conjunctivae are normal. Pupils are equal, round, and reactive to light. Right eye exhibits no discharge. Left eye exhibits no discharge.  Neck: Neck supple. No JVD present.  Cardiovascular: Normal rate, regular rhythm, normal heart sounds and intact distal pulses.  Exam reveals no gallop and no friction rub.   No murmur heard. Bilateral radial pulses are intact.  Pulmonary/Chest: Effort normal and breath sounds normal. No respiratory distress. She has no wheezes. She has no rales.  Lungs are clear to auscultation bilaterally.  Abdominal: Soft. Bowel sounds are normal. She exhibits no distension. There is  tenderness.  Abdomen is soft. Bowel sounds are present. Patient has moderate right upper quadrant tenderness. Negative psoas and obturator sign. No right lower quadrant tenderness.  Musculoskeletal: She exhibits no edema.  Lymphadenopathy:    She has no cervical adenopathy.  Neurological: She is alert. Coordination normal.  Skin: Skin is warm and dry. No rash noted. She is not diaphoretic. No erythema. No pallor.  Psychiatric: She has a normal mood and affect. Her behavior is normal.  Nursing note and vitals reviewed.   ED Course  Procedures (including critical care time) Labs Review Labs Reviewed  COMPREHENSIVE METABOLIC PANEL - Abnormal; Notable for the following:    Glucose, Bld 133 (*)    BUN 25 (*)    Creatinine, Ser 2.23 (*)    ALT 12 (*)    GFR calc non Af Amer 24 (*)    GFR calc Af Amer 28 (*)    All other components within normal limits  CBC WITH DIFFERENTIAL/PLATELET - Abnormal; Notable for the following:    RBC 3.43 (*)    Hemoglobin 10.5 (*)    HCT 31.3 (*)    All other components within normal limits  LIPASE, BLOOD  URINALYSIS, ROUTINE W REFLEX MICROSCOPIC    Imaging Review No results found.   EKG Interpretation   Date/Time:  Wednesday Apr 30 2015 10:24:44 EDT Ventricular Rate:  51 PR Interval:  157 QRS Duration: 75 QT Interval:  431 QTC Calculation: 397 R Axis:   63 Text Interpretation:  Sinus rhythm Probable anteroseptal infarct, old No  significant change since Confirmed by NANAVATI, MD, ANKIT [phone]) on  04/30/2015 11:36:55 AM      Filed Vitals:   04/30/15 1145 04/30/15 1215 04/30/15 1300 04/30/15 1345  BP: 121/57 141/48 137/47 107/45  Pulse: 59 51 55 52  Temp:      TempSrc:      Resp: 22 23 24 24   Height:      Weight:      SpO2: 100% 100% 99% 99%     MDM   Meds given in ED:  Medications  sodium chloride 0.9 % bolus 1,000 mL (0 mLs Intravenous Stopped 04/30/15 1126)  promethazine (PHENERGAN) injection 25 mg (25 mg Intravenous Given  04/30/15 1039)  LORazepam (ATIVAN) injection 1 mg (1 mg Intravenous Given 04/30/15 1019)  heparin lock flush 100 unit/mL (500 Units Intracatheter Given 04/30/15 1434)    Discharge Medication List as of 04/30/2015  2:17 PM  Final diagnoses:  Non-intractable cyclical vomiting with nausea    This is a 49 y.o. female with a history of cyclic vomiting syndrome, pancreatitis, MI, GERD, diabetes, gastroparesis, chronic kidney disease and THC abuse who presents to the emergency department complaining of epigastric pain ongoing for the past 6 days and vomiting for the past 3 days. The patient reports that she feels like her cyclic vomiting syndrome is flaring up today.   On examination patient is afebrile and nontoxic appearing. Patient has mild right upper quadrant tenderness to palpation. Patient has had a previous cholecystectomy. Will obtain basic blood work and urinalysis. Her urinalysis is negative for infection. The patient's CMP is unchanged from her previous studies. Lipase is within normal limits. The patient's CBC is unremarkable. At reevaluation the patient reports feeling much better. She has tolerated water and the ED. She reports her abdominal pain has resolved. She reports her nausea has resolved. Is feeling ready to be discharged. Education provided on proper diet advised to stop smoking THC. Patient provided with prescription for Phenergan. She reports having a follow-up appointment next week with her primary care provider. I advised her to keep this appointment. Advised the patient to return to the emergency department with new or worsening symptoms or new concerns. The patient verbalized understanding and agreement with plan.  This patient was discussed with Dr. Kathrynn Humble who agrees with assessment and plan.     Waynetta Pean, PA-C 04/30/15 1746  Varney Biles, MD 05/01/15 [phone]

## 2015-05-05 ENCOUNTER — Encounter: Payer: Self-pay | Admitting: Podiatry

## 2015-05-05 ENCOUNTER — Ambulatory Visit (INDEPENDENT_AMBULATORY_CARE_PROVIDER_SITE_OTHER): Payer: Medicare Other | Admitting: Podiatry

## 2015-05-05 VITALS — BP 119/60 | HR 57 | Resp 12

## 2015-05-05 DIAGNOSIS — E0841 Diabetes mellitus due to underlying condition with diabetic mononeuropathy: Secondary | ICD-10-CM

## 2015-05-05 DIAGNOSIS — B351 Tinea unguium: Secondary | ICD-10-CM

## 2015-05-05 DIAGNOSIS — L84 Corns and callosities: Secondary | ICD-10-CM

## 2015-05-05 DIAGNOSIS — M79676 Pain in unspecified toe(s): Secondary | ICD-10-CM | POA: Diagnosis not present

## 2015-05-05 NOTE — Progress Notes (Signed)
   Subjective:    Patient ID: [name] [name], female    DOB: February 13, 1966, 49 y.o.   MRN: [id number]  HPI  N-SORE, SKIN THICK L-B/L 1ST AND 5TH MET. D-6 YEARS O-SLOWLY C-WORSE, THICKER A-WALKING. PRESSURE T-CALLUS REMOVAL  ALSO, PT REQUESTING FOR TOENAILS DEBRIDEMENT. She stated toenails are uncomfortable when walking wearing shoes  She denies any history of foot ulceration or claudication Review of Systems  Gastrointestinal: Positive for nausea.  Endocrine: Positive for cold intolerance.  Genitourinary: Positive for frequency.       Objective:   Physical Exam  Vascular: DP pulses 2/4 bilaterally PT pulses 2/4 bilaterally  Neurological: Sensation to 10 g monofilament wire intact 5/5 bilaterally Vibratory sensation intact bilaterally Ankle reflex equal and reactive bilaterally  Dermatological: The toenails are elongated, incurvated, discolored and tender direct palpation 6-10 Plantar calluses first and fifth MPJ bilaterally  Dermatological: Hammertoe deformities 2-5 left There is no restriction ankle, subtalar, midtarsal joints bilaterally      Assessment & Plan:   Assessment: Satisfactory vascular status Protective sensation intact bilaterally Problem list includes diabetic neuropathy Symptomatic onychomycoses 6-10 Plantar keratoses 4  Plan: Review the results of patient's diabetic foot exam today Debridement toenails 10 and keratoses 4 without any bleeding  Reappoint 3 months

## 2015-05-05 NOTE — Patient Instructions (Signed)
Diabetes and Foot Care Diabetes may cause you to have problems because of poor blood supply (circulation) to your feet and legs. This may cause the skin on your feet to become thinner, break easier, and heal more slowly. Your skin may become dry, and the skin may peel and crack. You may also have nerve damage in your legs and feet causing decreased feeling in them. You may not notice minor injuries to your feet that could lead to infections or more serious problems. Taking care of your feet is one of the most important things you can do for yourself.  HOME CARE INSTRUCTIONS  Wear shoes at all times, even in the house. Do not go barefoot. Bare feet are easily injured.  Check your feet daily for blisters, cuts, and redness. If you cannot see the bottom of your feet, use a mirror or ask someone for help.  Wash your feet with warm water (do not use hot water) and mild soap. Then pat your feet and the areas between your toes until they are completely dry. Do not soak your feet as this can dry your skin.  Apply a moisturizing lotion or petroleum jelly (that does not contain alcohol and is unscented) to the skin on your feet and to dry, brittle toenails. Do not apply lotion between your toes.  Trim your toenails straight across. Do not dig under them or around the cuticle. File the edges of your nails with an emery board or nail file.  Do not cut corns or calluses or try to remove them with medicine.  Wear clean socks or stockings every day. Make sure they are not too tight. Do not wear knee-high stockings since they may decrease blood flow to your legs.  Wear shoes that fit properly and have enough cushioning. To break in new shoes, wear them for just a few hours a day. This prevents you from injuring your feet. Always look in your shoes before you put them on to be sure there are no objects inside.  Do not cross your legs. This may decrease the blood flow to your feet.  If you find a minor scrape,  cut, or break in the skin on your feet, keep it and the skin around it clean and dry. These areas may be cleansed with mild soap and water. Do not cleanse the area with peroxide, alcohol, or iodine.  When you remove an adhesive bandage, be sure not to damage the skin around it.  If you have a wound, look at it several times a day to make sure it is healing.  Do not use heating pads or hot water bottles. They may burn your skin. If you have lost feeling in your feet or legs, you may not know it is happening until it is too late.  Make sure your health care provider performs a complete foot exam at least annually or more often if you have foot problems. Report any cuts, sores, or bruises to your health care provider immediately. SEEK MEDICAL CARE IF:   You have an injury that is not healing.  You have cuts or breaks in the skin.  You have an ingrown nail.  You notice redness on your legs or feet.  You feel burning or tingling in your legs or feet.  You have pain or cramps in your legs and feet.  Your legs or feet are numb.  Your feet always feel cold. SEEK IMMEDIATE MEDICAL CARE IF:   There is increasing redness,   swelling, or pain in or around a wound.  There is a red line that goes up your leg.  Pus is coming from a wound.  You develop a fever or as directed by your health care provider.  You notice a bad smell coming from an ulcer or wound. Document Released: 12/10/2000 Document Revised: 08/15/2013 Document Reviewed: 05/22/2013 ExitCare Patient Information 2015 [employer]. This information is not intended to replace advice given to you by your health care provider. Make sure you discuss any questions you have with your health care provider.  

## 2015-05-08 ENCOUNTER — Encounter: Payer: Self-pay | Admitting: Family Medicine

## 2015-05-08 ENCOUNTER — Ambulatory Visit (INDEPENDENT_AMBULATORY_CARE_PROVIDER_SITE_OTHER): Payer: Medicare Other | Admitting: Family Medicine

## 2015-05-08 VITALS — BP 189/96 | HR 47 | Temp 97.7°F | Ht 62.0 in | Wt 154.0 lb

## 2015-05-08 DIAGNOSIS — G8929 Other chronic pain: Secondary | ICD-10-CM | POA: Diagnosis not present

## 2015-05-08 DIAGNOSIS — E1365 Other specified diabetes mellitus with hyperglycemia: Secondary | ICD-10-CM

## 2015-05-08 DIAGNOSIS — N183 Chronic kidney disease, stage 3 (moderate): Secondary | ICD-10-CM | POA: Diagnosis not present

## 2015-05-08 DIAGNOSIS — G43A0 Cyclical vomiting, not intractable: Secondary | ICD-10-CM | POA: Diagnosis not present

## 2015-05-08 DIAGNOSIS — E1329 Other specified diabetes mellitus with other diabetic kidney complication: Secondary | ICD-10-CM | POA: Diagnosis not present

## 2015-05-08 DIAGNOSIS — Z7189 Other specified counseling: Secondary | ICD-10-CM | POA: Diagnosis not present

## 2015-05-08 DIAGNOSIS — E1322 Other specified diabetes mellitus with diabetic chronic kidney disease: Secondary | ICD-10-CM

## 2015-05-08 DIAGNOSIS — F411 Generalized anxiety disorder: Secondary | ICD-10-CM

## 2015-05-08 DIAGNOSIS — R1115 Cyclical vomiting syndrome unrelated to migraine: Secondary | ICD-10-CM

## 2015-05-08 DIAGNOSIS — IMO0002 Reserved for concepts with insufficient information to code with codable children: Secondary | ICD-10-CM

## 2015-05-08 LAB — POCT GLYCOSYLATED HEMOGLOBIN (HGB A1C): Hemoglobin A1C: 7.4

## 2015-05-08 MED ORDER — OXYCODONE HCL 5 MG PO TABS: ORAL_TABLET | ORAL | Status: DC

## 2015-05-08 MED ORDER — LORAZEPAM 1 MG PO TABS: 1.0000 mg | ORAL_TABLET | Freq: Two times a day (BID) | ORAL | Status: DC | PRN

## 2015-05-08 NOTE — Progress Notes (Signed)
Patient ID: [name] [name], female   DOB: 1966-05-18, 49 y.o.   MRN: [id number] Subjective:   CC: F/u pain, anxiety, and cyclic vomiting  HPI:   Follow-up pain Patient states that she is on oxycodone about 4 tablets per week with 2 remaining, for severe back pain. She also occasionally takes when she has cyclic vomiting syndrome. She does not do regular back exercises but does do some crunches and stretching. She would like two months of refills for this as well as her anxiety medication due to going on a trip in June. She denies any bowel or bladder incontinence. She has no focal weakness or perineal numbness or tingling. She tried Lyrica which she states does not work at all.   Follow-up anxiety/cyclic vomiting syndrome Chronic symptoms. States that she takes oxycodone and Ativan whenever she feels sick. She ends up taking about 6-7 Ativan per week and 4 oxycodone per week. She has 3 Ativan left and 2 oxycodone left. She is very hesitant to discuss her anxiety but becomes tearful when we began to discuss it. She was recently seen by a therapist but stopped seeing him because he left. She denies alcohol use, and has been off of marijuana reportedly for 2 weeks. She does report current constipation with only one stool so far this week.  Recent UTI/trichomonal infection Patient was put on course of metronidazole which she states she completed. Symptoms have since cleared.   Review of Systems - Per HPI.   SH: States she stopped using marijuana 2 weeks ago.    Objective:  Physical Exam BP 189/96 mmHg  Pulse 47  Temp(Src) 97.7 F (36.5 C) (Oral)  Ht 5\' 2"  (1.575 m)  Wt 154 lb (69.854 kg)  BMI 28.16 kg/m2 GEN: NAD Cardio vascular: Regular rate and rhythm, no murmurs rubs or gallops sex and pulmonary: Clear to auscultation bilaterally, normal effort Abdomen: Soft, nontender, nondistended, full on palpation possibly with stool Extremity: No lower extremity edema or calf tenderness Back:  Normal gait, full range of motion at easy movement, no deformity HEENT: MMM PSYCH: Mood and affect depressed appearing with tearfulness during exam and discussion, frustrated/upset, however smiles and interacts appropriately, normal rate and volume of speech  Assessment:     [name] [name] is a 49 y.o. female here for f/u of chronic pain, anxiety, and cyclic vomiting syndrome    Plan:     # See problem list and after visit summary for problem-specific plans. -Elevated blood pressure. We'll follow up at next visit. Likely due to patient getting upset during office visit. -Recent trichomonal infection: Resolved symptoms after taking course of metronidazole.  -Discussed using protection regularly with sexual partner. Patient states partner was also treated.  # Health Maintenance: Not discussed  Follow-up: Follow up in 2 months for chronic pain, anxiety, and cyclic vomiting. Discussed that discussing these issues for which she uses oxycodone and Ativan chronically would require one full office visit without discussing other issues, each time these are prescribed.   Hilton Sinclair, MD [place]

## 2015-05-08 NOTE — Assessment & Plan Note (Signed)
Chronic back pain, no red flag symptoms. Discussed that this is likely related to depressive/anxiety symptoms. -Oxycodone # 15 prescribed x  2 months. Will not increase as chronic narcotics are not main management for back pain. -Patient to follow up every 2 months. -UDS today. [place] controlled substance database reviewed, no red flags identified. -Follow-up in 2 months for pain management and to meet new PCP. -Stay active. Back exercises reprinted for lumbago as doing this daily is mainstay of tx.

## 2015-05-08 NOTE — Patient Instructions (Signed)
It was good to see you today.  I strongly urge you to visit a therapist - check out [email or web address]. I am refilling medicines for 2 months. Follow up before running out to meet your new doctor and discuss refills. This should be the only thing you talk about as it takes some time. If there are other major issues, please schedule follow up for those. Stay active to keep depression/anxiety under control and your back pain at [place]. Also, at follow up we will need to again consider a longer acting medication for depression.  For constipation, drink plenty of fluids and take a daily fiber supplement like metamucil (sugar-free). If this is not helping in 3-4 days, take Miralax once daily.  Best,  Hilton Sinclair, MD

## 2015-05-08 NOTE — Assessment & Plan Note (Signed)
Cyclic vomiting syndrome, large component of anxiety in this as well as patient's chronic pain. She is beginning to understand this and is amenable to [place] restarting seeing a therapist. -[email or web address] website provided again. -Ativan #45 per month refilled x 2 months. Have discussed with patient that we will not increase beyond this. Would in fact like to continue working on tapering. Also on phenergan. -At follow-up in 2 months, we'll need to place patient on a more chronic medication for anxiety such as an SSRI or SNRI -Constipation contributing: Discussed using fiber supplement such as Metamucil and drinking plenty fluids. If needed, MiraLAX daily. - States stopped marijuana 2 weeks ago. Encouraged to continue off this.

## 2015-05-08 NOTE — Assessment & Plan Note (Signed)
Chronic; pt resistant to discuss but opens up. Resistant to setting up appt with Dr Gwenlyn Saran. Amenable to seeing someone outside of our clinic -[email or web address] website provided again. -Ativan #45 per month refilled x 2 months. Have discussed with patient that we will not increase beyond this. Would in fact like to continue working on tapering.  -At follow-up in 2 months, we'll need to place patient on a more chronic medication for anxiety such as an SSRI or SNRI

## 2015-05-09 LAB — DRUG SCR UR, PAIN MGMT, REFLEX CONF
AMPHETAMINE SCRN UR: NEGATIVE
BENZODIAZEPINES.: NEGATIVE
Barbiturate Quant, Ur: NEGATIVE
CREATININE, U: 113.93 mg/dL
Cocaine Metabolites: NEGATIVE
Methadone: NEGATIVE
Opiates: NEGATIVE
PHENCYCLIDINE (PCP): NEGATIVE
Propoxyphene: NEGATIVE

## 2015-05-12 ENCOUNTER — Telehealth: Payer: Self-pay | Admitting: Family Medicine

## 2015-05-12 LAB — CANNABANOIDS (GC/LC/MS), URINE

## 2015-05-12 NOTE — Telephone Encounter (Signed)
Left message on voicemail for patient to return call. When she does, I will need to speak with her about UDS result which had no opioid or benzodiazepine despite pt getting regular refills for this. Will need her to help me understand why negative. Could be because she had not taken in a few days but it also raises concern for diversion. Also, marijuana metabolites positive, which they have been in the past. Will again need to reiterate that she needs to stop using these if she wants to continue getting these medications chronically, otherwise we will need to taper them off and stop prescribing them as it is unsafe and not good medical care to prescribe these together, especially in anyone using drugs.  Hilton Sinclair, MD

## 2015-05-12 NOTE — Telephone Encounter (Signed)
Pt returned call

## 2015-05-13 NOTE — Telephone Encounter (Signed)
Mrs. Yandow is returning the phone call once again, Dr. Dianah Field has been informed and the patient stated that [phone] was a good number to contact her (she did not answer the phone previously because she did not know the number), I informed the patient to expect a phone call this afternoon around 1pm. Thank you, Fonda Kinder, ASA

## 2015-05-13 NOTE — Telephone Encounter (Signed)
Called and spoke with patient re: abnormal UDS results. She states she had taken oxycodone 1.5-2 weeks prior to 5/12 visit and ativan 1 day prior, and had smoked marijuana 2 weeks prior to our visit. Reported timing with ativan and marijuana discordant with results. Discussed we would need to f/u with another UDS at next visit in 2 months and continue discussion surrounding this. Again urged marijuana cessation and stated we would not increase rx further.  Pt needs f/u for HTN and AKI. States no longer feeling sick like she was when cr checked 5/4 in ED, so told her as long as hydrating well, could restart lisinopril and hold whenever sick with cyclic vomiting. Return 2 weeks for recheck of HTN and Creatinine.  [name] Sinclair, MD

## 2015-05-13 NOTE — Telephone Encounter (Signed)
Will forward to MD. Carollyn Etcheverry,CMA  

## 2015-05-25 NOTE — Progress Notes (Signed)
Addendum - linked UDS order to appropriate code for this patient - chronic pain.

## 2015-06-02 ENCOUNTER — Encounter: Payer: Self-pay | Admitting: Family Medicine

## 2015-06-02 ENCOUNTER — Ambulatory Visit (INDEPENDENT_AMBULATORY_CARE_PROVIDER_SITE_OTHER): Payer: Medicare Other | Admitting: Family Medicine

## 2015-06-02 VITALS — BP 98/52 | HR 55 | Temp 98.2°F | Ht 61.0 in | Wt 152.2 lb

## 2015-06-02 DIAGNOSIS — N183 Chronic kidney disease, stage 3 (moderate): Secondary | ICD-10-CM

## 2015-06-02 DIAGNOSIS — E1121 Type 2 diabetes mellitus with diabetic nephropathy: Secondary | ICD-10-CM | POA: Diagnosis not present

## 2015-06-02 DIAGNOSIS — E119 Type 2 diabetes mellitus without complications: Secondary | ICD-10-CM | POA: Diagnosis not present

## 2015-06-02 DIAGNOSIS — I10 Essential (primary) hypertension: Secondary | ICD-10-CM | POA: Diagnosis not present

## 2015-06-02 DIAGNOSIS — E1365 Other specified diabetes mellitus with hyperglycemia: Secondary | ICD-10-CM

## 2015-06-02 DIAGNOSIS — K5909 Other constipation: Secondary | ICD-10-CM

## 2015-06-02 DIAGNOSIS — E1322 Other specified diabetes mellitus with diabetic chronic kidney disease: Secondary | ICD-10-CM

## 2015-06-02 DIAGNOSIS — F17200 Nicotine dependence, unspecified, uncomplicated: Secondary | ICD-10-CM

## 2015-06-02 DIAGNOSIS — E1329 Other specified diabetes mellitus with other diabetic kidney complication: Secondary | ICD-10-CM

## 2015-06-02 DIAGNOSIS — L299 Pruritus, unspecified: Secondary | ICD-10-CM

## 2015-06-02 DIAGNOSIS — Z720 Tobacco use: Secondary | ICD-10-CM

## 2015-06-02 DIAGNOSIS — IMO0002 Reserved for concepts with insufficient information to code with codable children: Secondary | ICD-10-CM

## 2015-06-02 LAB — LIPID PANEL
CHOL/HDL RATIO: 2.7 ratio
Cholesterol: 152 mg/dL (ref 0–200)
HDL: 56 mg/dL (ref >=46)
LDL Cholesterol: 78 mg/dL (ref 0–99)
Triglycerides: 88 mg/dL (ref <150)
VLDL: 18 mg/dL (ref 0–40)

## 2015-06-02 MED ORDER — ASPIRIN EC 81 MG PO TBEC: 81.0000 mg | DELAYED_RELEASE_TABLET | Freq: Every day | ORAL | Status: DC

## 2015-06-02 MED ORDER — POLYETHYLENE GLYCOL 3350 17 G PO PACK: 17.0000 g | PACK | Freq: Every day | ORAL | Status: DC | PRN

## 2015-06-02 MED ORDER — LISINOPRIL 40 MG PO TABS: 40.0000 mg | ORAL_TABLET | Freq: Every day | ORAL | Status: DC

## 2015-06-02 MED ORDER — INSULIN GLARGINE 100 UNIT/ML ~~LOC~~ SOLN: 22.0000 [IU] | Freq: Every day | SUBCUTANEOUS | Status: DC

## 2015-06-02 MED ORDER — HYDRALAZINE HCL 10 MG PO TABS: 10.0000 mg | ORAL_TABLET | Freq: Three times a day (TID) | ORAL | Status: DC

## 2015-06-02 MED ORDER — CARVEDILOL 25 MG PO TABS: 25.0000 mg | ORAL_TABLET | Freq: Two times a day (BID) | ORAL | Status: DC

## 2015-06-02 MED ORDER — BLOOD GLUCOSE MONITOR KIT: PACK | Status: DC

## 2015-06-02 MED ORDER — CETIRIZINE HCL 10 MG PO TABS
10.0000 mg | ORAL_TABLET | Freq: Every day | ORAL | Status: DC
Start: 2015-06-02 — End: 2015-06-20

## 2015-06-02 NOTE — Progress Notes (Signed)
Patient ID: [name] [name], female   DOB: 28-Dec-1965, 49 y.o.   MRN: [id number] Subjective:   CC: F/u HTN and DM  HPI:   Follow-up hypertension Blood pressures at home: Does not check at home. At pharmacy 2 weeks ago, it was a little high but does not rememeber numbers. Medications: Ran out of hydralazine Saturday; thinks lisinopril makes her itch; takes carvedilol daily. Side effects: No angioedema sx. End-organ symptoms: CP/SOB? None. Headaches today, no dizziness/syncope. Some leg swelling both feet up to mid-shins that is intermittent and chronic. Cramps in legs/feet. Renal function: 2.2 (baseline 1.8-2.2 ; Appt with nephrologist July). Drinking well.  Diet/exercise: Does not eat much, nibbles throughout the day. Salt intake: Tries to keep salt intake low in cooking. Tobacco use: Smokes 7 cig/day.  F/u Diabetes Mellitus Medications: lantus 22 u daily. Denies side effects. Blood sugar control: Does not check. Sx of hypoglycemia: yesterday felt hot and mildly confused, occured 2x in past 3 mo. Sx of hyperglycemia: No headache (except today), blurred vision, polyuria or polydypsia. End-organ symptoms: No CP or dyspnea  Heart: Is pt taking aspirin, statin, ACE: No asa because acid reflux years ago. Lipids last checked July, not on statin. Kidneys: Creatinine last checked 04/2015 and at baseline. Feet: Need check today; [facility] Eyes: Missed April appt with Dr Gershon Crane; h/o cataract removal   Review of Systems - Per HPI. Intermittent itching, no skin changes, fevers, or chills. Constipated .  PMH: Reviewed Smoking status: 7 cig/day; Quit drinking 5+ years ago    Objective:  Physical Exam BP 107/45 mmHg  Pulse 55  Temp(Src) 98.2 F (36.8 C) (Oral)  Ht 5\' 1"  (1.549 m)  Wt 152 lb 3 oz (69.032 kg)  BMI 28.77 kg/m2 GEN: NAD Cardiovascular: Regular rate and rhythm, no murmurs rubs or gallops Pulmonary: Clear to auscultation bilaterally, normal effort Abdomen: Soft,  nontender, nondistended, mildly full Extremities: No lower extremity edema or calf tenderness Diabetic foot exam 2+ bilateral dorsalis pedis pulses, mild bunionette forming, somewhat firm calluses bilateral first and fifth metatarsal pads, monofilament testing normal except decreased sensation bilateral heels where there is thickened skin, no skin breakdown, erythema, or tenderness. Toenails are hypertrophic and very curved great toenail bilaterally, no sign of infection (erythema, purulence) HEENT: Neck supple, no LAD or thyromeg    Assessment:     [name] [name] is a 49 y.o. female here for f/u DM and HTN.    Plan:     # See problem list and after visit summary for problem-specific plans. - Itching - no abnormalities on exam. Cetirizine trial.  # Health Maintenance: Not discussed  Follow-up: Follow up in 3 months for f/u DM and HTN. F/u 1 month to f/u chronic pain, anxiety, cyclic vomiting.   Hilton Sinclair, MD [place]

## 2015-06-02 NOTE — Patient Instructions (Signed)
Good to see you.  Please keep working on quitting smoking. As you are leaving, make an appointment with Dr. [name] to discuss this. I have refilled blood pressure and diabetes medications for 3 months. Follow-up in 3 months to rediscuss. Continue working on diet control and get every day exercise. Eat breakfast every day that includes a protein. See guidelines below. Try taking one aspirin every other day and if this starts to irritate stomach, decreased to once every 3-4 days. Raise legs and try compression stockings for leg swelling to see if this will help with cramping. I will order a blood sugar monitor. Check blood sugars fasting every day and if symptomatic and call in one week with these. We're checking cholesterol today and I will call you with recommendations. Make an appointment with Dr. Gershon Crane and an appointment with the [facility].  [facility] [employer] or [facility] can m ake recommendations based on your foot type. Tell them you are diabetic and need something that cushions the foot well.   Diet Recommendations for Diabetes   Starchy (carb) foods include: Bread, rice, pasta, potatoes, corn, crackers, bagels, muffins, all baked goods.  (Fruits, milk, and yogurt also have carbohydrate, but most of these foods will not spike your blood sugar as the starchy foods will.)  A few fruits do cause high blood sugars; use small portions of bananas (limit to 1/2 at a time), grapes, and most tropical fruits.    Protein foods include: Meat, fish, poultry, eggs, dairy foods, and beans such as pinto and kidney beans (beans also provide carbohydrate).   1. Eat at least 3 meals and 1-2 snacks per day. Never go more than 4-5 hours while awake without eating.  2. Limit starchy foods to TWO per meal and ONE per snack. ONE portion of a starchy  food is equal to the following:   - ONE slice of bread (or its equivalent, such as half of a hamburger bun).   - 1/2 cup of a "scoopable" starchy food such as  potatoes or rice.   - 15 grams of carbohydrate as shown on food label.  3. Both lunch and dinner should include a protein food, a carb food, and vegetables.   - Obtain twice as many veg's as protein or carbohydrate foods for both lunch and dinner.   - Fresh or frozen veg's are best.   - Try to keep frozen veg's on hand for a quick vegetable serving.    4. Breakfast should always include protein.

## 2015-06-06 ENCOUNTER — Telehealth: Payer: Self-pay | Admitting: Family Medicine

## 2015-06-06 DIAGNOSIS — K5900 Constipation, unspecified: Secondary | ICD-10-CM

## 2015-06-06 DIAGNOSIS — K5909 Other constipation: Secondary | ICD-10-CM | POA: Insufficient documentation

## 2015-06-06 HISTORY — DX: Constipation, unspecified: K59.00

## 2015-06-06 NOTE — Telephone Encounter (Signed)
Tried to call patient regarding medication but her phone doesn't have room for voicemails. [name] [name],CMA

## 2015-06-06 NOTE — Assessment & Plan Note (Signed)
Likely oipioid induced in part. Full on exam. Passing gas with NABS and nonacute, no emesis. -Suggested using metamucil / miralax once daily until stooling regularly.

## 2015-06-06 NOTE — Assessment & Plan Note (Signed)
Diabetes stable with A1c 04/2015 7.4, down from 7.8 in February. Last creatinine 2.2 (baseline 1.8-2.2). Intermittent episodes of hypoglycemia. -Medications: lantus 22 u daily -Heart: Try aspirin q other day to avoid GERD, continue lisinopril. Lipid panel today to decide on statin. Diet/exercise discussed. -Kidneys: Rpt BMET in Nov.  -Feet: Repeat foot exam done today.  -Eyes: Pt to call Dr Gershon Crane for Diabetic eye exam  -Monitor BG. Ordered monitor/supplies. Pt to check fasting and if symptomatic and call with values in 1 week. Keep hard candy at all times and bfast every day. -Follow-up: 3 months for next A1c and f/u with new PCP. Refilled x 3 mo. -Occsional foot pain. Suggested she see [facility] for shoes for her foot type.

## 2015-06-06 NOTE — Telephone Encounter (Signed)
Will forward to PCP.  Bryah Ocheltree L, RN  

## 2015-06-06 NOTE — Assessment & Plan Note (Signed)
Control: Good/ low today but asymptomatic. -Medications: lisinopril, carvedilol, hydralazine, .  -Renal function: baseline (2.2) in May.. -Diet/exercise: Discussed. Salt intake: Continue to keep low.. -Tobacco use: Work on cessation. Urged to make appt with Dr [name].. - F/u: 3 months. Refilled meds x 3 mo.

## 2015-06-06 NOTE — Telephone Encounter (Signed)
I sent in cetirizine for itching on the day of our visit. I had told her regarding cholesterol, I would call her to discuss once bloodwork resulted and I had a chance to look at it. Let her know this and I will call her soon regarding cholesterol recommendations.  Hilton Sinclair, MD

## 2015-06-06 NOTE — Telephone Encounter (Signed)
Patient is calling because she was seen by her PCP on June 6th, and she was told that something for itching and something for high cholesterol would be called in to her pharmacy and it never was. She doesn't know what was supposed to be prescribed. She would like to have this completed asap. She needs it sent to [facility] on [address], [place], [place] [address]; Phone: [phone]. Please follow up with this patient to ensure that this matter has been taken care of for her. Thank you, [name] [name], ASA

## 2015-06-06 NOTE — Assessment & Plan Note (Signed)
Up to 7 cigs/day, contemplational. -Urged to make appt for cessation planning with Dr [name].

## 2015-06-09 ENCOUNTER — Inpatient Hospital Stay (HOSPITAL_COMMUNITY)
Admission: EM | Admit: 2015-06-09 | Discharge: 2015-06-13 | DRG: 102 | Disposition: A | Discharge status: Home / Self Care | Payer: Medicare Other | Attending: Family Medicine | Admitting: Family Medicine

## 2015-06-09 ENCOUNTER — Encounter (HOSPITAL_COMMUNITY): Payer: Self-pay | Admitting: *Deleted

## 2015-06-09 ENCOUNTER — Emergency Department (HOSPITAL_COMMUNITY): Payer: Medicare Other

## 2015-06-09 DIAGNOSIS — J302 Other seasonal allergic rhinitis: Secondary | ICD-10-CM | POA: Diagnosis not present

## 2015-06-09 DIAGNOSIS — J189 Pneumonia, unspecified organism: Secondary | ICD-10-CM | POA: Diagnosis present

## 2015-06-09 DIAGNOSIS — G43909 Migraine, unspecified, not intractable, without status migrainosus: Secondary | ICD-10-CM | POA: Diagnosis not present

## 2015-06-09 DIAGNOSIS — IMO0002 Reserved for concepts with insufficient information to code with codable children: Secondary | ICD-10-CM | POA: Diagnosis present

## 2015-06-09 DIAGNOSIS — G4700 Insomnia, unspecified: Secondary | ICD-10-CM | POA: Diagnosis present

## 2015-06-09 DIAGNOSIS — F17210 Nicotine dependence, cigarettes, uncomplicated: Secondary | ICD-10-CM | POA: Diagnosis not present

## 2015-06-09 DIAGNOSIS — E785 Hyperlipidemia, unspecified: Secondary | ICD-10-CM | POA: Diagnosis present

## 2015-06-09 DIAGNOSIS — F339 Major depressive disorder, recurrent, unspecified: Secondary | ICD-10-CM | POA: Diagnosis present

## 2015-06-09 DIAGNOSIS — G43A1 Cyclical vomiting, intractable: Principal | ICD-10-CM | POA: Diagnosis present

## 2015-06-09 DIAGNOSIS — N12 Tubulo-interstitial nephritis, not specified as acute or chronic: Secondary | ICD-10-CM

## 2015-06-09 DIAGNOSIS — E1322 Other specified diabetes mellitus with diabetic chronic kidney disease: Secondary | ICD-10-CM | POA: Diagnosis present

## 2015-06-09 DIAGNOSIS — Z90710 Acquired absence of both cervix and uterus: Secondary | ICD-10-CM

## 2015-06-09 DIAGNOSIS — E1122 Type 2 diabetes mellitus with diabetic chronic kidney disease: Secondary | ICD-10-CM | POA: Diagnosis present

## 2015-06-09 DIAGNOSIS — I252 Old myocardial infarction: Secondary | ICD-10-CM | POA: Diagnosis not present

## 2015-06-09 DIAGNOSIS — R1010 Upper abdominal pain, unspecified: Secondary | ICD-10-CM | POA: Diagnosis present

## 2015-06-09 DIAGNOSIS — N1330 Unspecified hydronephrosis: Secondary | ICD-10-CM | POA: Diagnosis not present

## 2015-06-09 DIAGNOSIS — Z9049 Acquired absence of other specified parts of digestive tract: Secondary | ICD-10-CM | POA: Diagnosis present

## 2015-06-09 DIAGNOSIS — I129 Hypertensive chronic kidney disease with stage 1 through stage 4 chronic kidney disease, or unspecified chronic kidney disease: Secondary | ICD-10-CM | POA: Diagnosis present

## 2015-06-09 DIAGNOSIS — F1290 Cannabis use, unspecified, uncomplicated: Secondary | ICD-10-CM | POA: Diagnosis not present

## 2015-06-09 DIAGNOSIS — Z886 Allergy status to analgesic agent status: Secondary | ICD-10-CM

## 2015-06-09 DIAGNOSIS — Z720 Tobacco use: Secondary | ICD-10-CM | POA: Diagnosis present

## 2015-06-09 DIAGNOSIS — F17200 Nicotine dependence, unspecified, uncomplicated: Secondary | ICD-10-CM

## 2015-06-09 DIAGNOSIS — R1013 Epigastric pain: Secondary | ICD-10-CM | POA: Diagnosis not present

## 2015-06-09 DIAGNOSIS — Z79899 Other long term (current) drug therapy: Secondary | ICD-10-CM

## 2015-06-09 DIAGNOSIS — K859 Acute pancreatitis, unspecified: Secondary | ICD-10-CM | POA: Diagnosis present

## 2015-06-09 DIAGNOSIS — E1140 Type 2 diabetes mellitus with diabetic neuropathy, unspecified: Secondary | ICD-10-CM | POA: Diagnosis present

## 2015-06-09 DIAGNOSIS — K219 Gastro-esophageal reflux disease without esophagitis: Secondary | ICD-10-CM | POA: Diagnosis not present

## 2015-06-09 DIAGNOSIS — E1165 Type 2 diabetes mellitus with hyperglycemia: Secondary | ICD-10-CM | POA: Diagnosis present

## 2015-06-09 DIAGNOSIS — N3289 Other specified disorders of bladder: Secondary | ICD-10-CM | POA: Diagnosis not present

## 2015-06-09 DIAGNOSIS — B351 Tinea unguium: Secondary | ICD-10-CM | POA: Diagnosis not present

## 2015-06-09 DIAGNOSIS — Z8249 Family history of ischemic heart disease and other diseases of the circulatory system: Secondary | ICD-10-CM

## 2015-06-09 DIAGNOSIS — K5900 Constipation, unspecified: Secondary | ICD-10-CM | POA: Diagnosis not present

## 2015-06-09 DIAGNOSIS — Z833 Family history of diabetes mellitus: Secondary | ICD-10-CM | POA: Diagnosis not present

## 2015-06-09 DIAGNOSIS — E1143 Type 2 diabetes mellitus with diabetic autonomic (poly)neuropathy: Secondary | ICD-10-CM | POA: Diagnosis not present

## 2015-06-09 DIAGNOSIS — R197 Diarrhea, unspecified: Secondary | ICD-10-CM | POA: Diagnosis not present

## 2015-06-09 DIAGNOSIS — E876 Hypokalemia: Secondary | ICD-10-CM | POA: Diagnosis not present

## 2015-06-09 DIAGNOSIS — D649 Anemia, unspecified: Secondary | ICD-10-CM | POA: Diagnosis not present

## 2015-06-09 DIAGNOSIS — F411 Generalized anxiety disorder: Secondary | ICD-10-CM | POA: Diagnosis present

## 2015-06-09 DIAGNOSIS — Z91030 Bee allergy status: Secondary | ICD-10-CM | POA: Diagnosis not present

## 2015-06-09 DIAGNOSIS — R110 Nausea: Secondary | ICD-10-CM | POA: Diagnosis not present

## 2015-06-09 DIAGNOSIS — E1329 Other specified diabetes mellitus with other diabetic kidney complication: Secondary | ICD-10-CM | POA: Diagnosis not present

## 2015-06-09 DIAGNOSIS — I10 Essential (primary) hypertension: Secondary | ICD-10-CM | POA: Diagnosis not present

## 2015-06-09 DIAGNOSIS — R0602 Shortness of breath: Secondary | ICD-10-CM | POA: Diagnosis not present

## 2015-06-09 DIAGNOSIS — E11649 Type 2 diabetes mellitus with hypoglycemia without coma: Secondary | ICD-10-CM | POA: Diagnosis not present

## 2015-06-09 DIAGNOSIS — Z79891 Long term (current) use of opiate analgesic: Secondary | ICD-10-CM

## 2015-06-09 DIAGNOSIS — Z794 Long term (current) use of insulin: Secondary | ICD-10-CM | POA: Diagnosis not present

## 2015-06-09 DIAGNOSIS — R1115 Cyclical vomiting syndrome unrelated to migraine: Secondary | ICD-10-CM

## 2015-06-09 DIAGNOSIS — Z881 Allergy status to other antibiotic agents status: Secondary | ICD-10-CM

## 2015-06-09 DIAGNOSIS — N2889 Other specified disorders of kidney and ureter: Secondary | ICD-10-CM | POA: Diagnosis not present

## 2015-06-09 DIAGNOSIS — E1365 Other specified diabetes mellitus with hyperglycemia: Secondary | ICD-10-CM

## 2015-06-09 DIAGNOSIS — F1220 Cannabis dependence, uncomplicated: Secondary | ICD-10-CM | POA: Diagnosis not present

## 2015-06-09 DIAGNOSIS — E559 Vitamin D deficiency, unspecified: Secondary | ICD-10-CM | POA: Diagnosis not present

## 2015-06-09 DIAGNOSIS — Z888 Allergy status to other drugs, medicaments and biological substances status: Secondary | ICD-10-CM | POA: Diagnosis not present

## 2015-06-09 DIAGNOSIS — Z7982 Long term (current) use of aspirin: Secondary | ICD-10-CM

## 2015-06-09 DIAGNOSIS — N183 Chronic kidney disease, stage 3 (moderate): Secondary | ICD-10-CM | POA: Diagnosis present

## 2015-06-09 HISTORY — DX: Tubulo-interstitial nephritis, not specified as acute or chronic: N12

## 2015-06-09 LAB — I-STAT CG4 LACTIC ACID, ED: LACTIC ACID, VENOUS: 0.93 mmol/L (ref 0.5–2.0)

## 2015-06-09 LAB — GLUCOSE, CAPILLARY
GLUCOSE-CAPILLARY: 158 mg/dL — AB (ref 65–99)
GLUCOSE-CAPILLARY: 166 mg/dL — AB (ref 65–99)

## 2015-06-09 LAB — COMPREHENSIVE METABOLIC PANEL
ALBUMIN: 4.1 g/dL (ref 3.5–5.0)
ALT: 11 U/L — ABNORMAL LOW (ref 14–54)
AST: 17 U/L (ref 15–41)
Alkaline Phosphatase: 86 U/L (ref 38–126)
Anion gap: 7 (ref 5–15)
BILIRUBIN TOTAL: 1 mg/dL (ref 0.3–1.2)
BUN: 33 mg/dL — AB (ref 6–20)
CHLORIDE: 111 mmol/L (ref 101–111)
CO2: 23 mmol/L (ref 22–32)
CREATININE: 2.2 mg/dL — AB (ref 0.44–1.00)
Calcium: 9.8 mg/dL (ref 8.9–10.3)
GFR calc Af Amer: 29 mL/min — ABNORMAL LOW (ref >60)
GFR, EST NON AFRICAN AMERICAN: 25 mL/min — AB (ref >60)
GLUCOSE: 175 mg/dL — AB (ref 65–99)
Potassium: 4.4 mmol/L (ref 3.5–5.1)
Sodium: 141 mmol/L (ref 135–145)
Total Protein: 7.6 g/dL (ref 6.5–8.1)

## 2015-06-09 LAB — CBC WITH DIFFERENTIAL/PLATELET
Basophils Absolute: 0 10*3/uL (ref 0.0–0.1)
Basophils Relative: 0 % (ref 0–1)
EOS ABS: 0.1 10*3/uL (ref 0.0–0.7)
EOS PCT: 1 % (ref 0–5)
HCT: 32.8 % — ABNORMAL LOW (ref 36.0–46.0)
HEMOGLOBIN: 11.3 g/dL — AB (ref 12.0–15.0)
LYMPHS ABS: 1.9 10*3/uL (ref 0.7–4.0)
Lymphocytes Relative: 18 % (ref 12–46)
MCH: 31 pg (ref 26.0–34.0)
MCHC: 34.5 g/dL (ref 30.0–36.0)
MCV: 89.9 fL (ref 78.0–100.0)
MONOS PCT: 11 % (ref 3–12)
Monocytes Absolute: 1.1 10*3/uL — ABNORMAL HIGH (ref 0.1–1.0)
NEUTROS PCT: 70 % (ref 43–77)
Neutro Abs: 7.1 10*3/uL (ref 1.7–7.7)
Platelets: 187 10*3/uL (ref 150–400)
RBC: 3.65 MIL/uL — AB (ref 3.87–5.11)
RDW: 14 % (ref 11.5–15.5)
WBC: 10.1 10*3/uL (ref 4.0–10.5)

## 2015-06-09 LAB — LIPASE, BLOOD: Lipase: 32 U/L (ref 22–51)

## 2015-06-09 LAB — OCCULT BLOOD X 1 CARD TO LAB, STOOL: Fecal Occult Bld: POSITIVE — AB

## 2015-06-09 LAB — URINALYSIS, ROUTINE W REFLEX MICROSCOPIC
Bilirubin Urine: NEGATIVE
Glucose, UA: NEGATIVE mg/dL
KETONES UR: NEGATIVE mg/dL
LEUKOCYTES UA: NEGATIVE
Nitrite: NEGATIVE
PROTEIN: 30 mg/dL — AB
Specific Gravity, Urine: 1.01 (ref 1.005–1.030)
UROBILINOGEN UA: 0.2 mg/dL (ref 0.0–1.0)
pH: 5.5 (ref 5.0–8.0)

## 2015-06-09 LAB — URINE MICROSCOPIC-ADD ON

## 2015-06-09 LAB — I-STAT TROPONIN, ED: Troponin i, poc: 0 ng/mL (ref 0.00–0.08)

## 2015-06-09 MED ORDER — HEPARIN SODIUM (PORCINE) 5000 UNIT/ML IJ SOLN
5000.0000 [IU] | Freq: Three times a day (TID) | INTRAMUSCULAR | Status: DC
  Administered 2015-06-09 – 2015-06-13 (×10): 5000 [IU] via SUBCUTANEOUS
  Filled 2015-06-09 (×10): qty 1

## 2015-06-09 MED ORDER — SODIUM CHLORIDE 0.9 % IJ SOLN
10.0000 mL | INTRAMUSCULAR | Status: DC | PRN
  Administered 2015-06-09 – 2015-06-13 (×5): 10 mL
  Filled 2015-06-09 (×5): qty 40

## 2015-06-09 MED ORDER — PROMETHAZINE HCL 25 MG/ML IJ SOLN
12.5000 mg | Freq: Once | INTRAMUSCULAR | Status: AC
  Administered 2015-06-09: 12.5 mg via INTRAVENOUS
  Filled 2015-06-09 (×2): qty 1

## 2015-06-09 MED ORDER — MORPHINE SULFATE 2 MG/ML IJ SOLN: 1.0000 mg | INTRAMUSCULAR | Status: DC | PRN

## 2015-06-09 MED ORDER — PANTOPRAZOLE SODIUM 40 MG IV SOLR
40.0000 mg | INTRAVENOUS | Status: DC
  Administered 2015-06-09 – 2015-06-12 (×5): 40 mg via INTRAVENOUS
  Filled 2015-06-09 (×4): qty 40

## 2015-06-09 MED ORDER — SODIUM CHLORIDE 0.9 % IJ SOLN
3.0000 mL | Freq: Two times a day (BID) | INTRAMUSCULAR | Status: DC
  Administered 2015-06-11 – 2015-06-13 (×3): 3 mL via INTRAVENOUS

## 2015-06-09 MED ORDER — LORAZEPAM 2 MG/ML IJ SOLN
1.0000 mg | Freq: Once | INTRAMUSCULAR | Status: AC
  Administered 2015-06-09: 1 mg via INTRAVENOUS
  Filled 2015-06-09: qty 1

## 2015-06-09 MED ORDER — HYDROMORPHONE HCL 1 MG/ML IJ SOLN
0.5000 mg | INTRAMUSCULAR | Status: DC | PRN
  Administered 2015-06-09 – 2015-06-10 (×3): 0.5 mg via INTRAVENOUS
  Filled 2015-06-09 (×3): qty 1

## 2015-06-09 MED ORDER — LORAZEPAM 2 MG/ML IJ SOLN
0.5000 mg | Freq: Four times a day (QID) | INTRAMUSCULAR | Status: DC | PRN
  Administered 2015-06-10: 0.5 mg via INTRAVENOUS
  Filled 2015-06-09: qty 1

## 2015-06-09 MED ORDER — PROMETHAZINE HCL 25 MG/ML IJ SOLN
12.5000 mg | Freq: Four times a day (QID) | INTRAMUSCULAR | Status: DC | PRN
  Administered 2015-06-09 – 2015-06-10 (×3): 12.5 mg via INTRAVENOUS
  Filled 2015-06-09 (×3): qty 1

## 2015-06-09 MED ORDER — INSULIN ASPART 100 UNIT/ML ~~LOC~~ SOLN
0.0000 [IU] | Freq: Three times a day (TID) | SUBCUTANEOUS | Status: DC
  Administered 2015-06-10 (×2): 3 [IU] via SUBCUTANEOUS
  Administered 2015-06-11: 5 [IU] via SUBCUTANEOUS

## 2015-06-09 MED ORDER — PROMETHAZINE HCL 25 MG/ML IJ SOLN
12.5000 mg | Freq: Once | INTRAMUSCULAR | Status: AC
  Administered 2015-06-09: 12.5 mg via INTRAVENOUS
  Filled 2015-06-09: qty 1

## 2015-06-09 MED ORDER — DEXTROSE 5 % IV SOLN
500.0000 mg | Freq: Once | INTRAVENOUS | Status: AC
  Administered 2015-06-09: 500 mg via INTRAVENOUS
  Filled 2015-06-09 (×2): qty 500

## 2015-06-09 MED ORDER — HYDRALAZINE HCL 20 MG/ML IJ SOLN
5.0000 mg | INTRAMUSCULAR | Status: DC | PRN
  Administered 2015-06-09 – 2015-06-11 (×2): 5 mg via INTRAVENOUS
  Filled 2015-06-09 (×2): qty 1

## 2015-06-09 MED ORDER — LORAZEPAM 0.5 MG PO TABS: 0.5000 mg | ORAL_TABLET | Freq: Four times a day (QID) | ORAL | Status: DC | PRN

## 2015-06-09 MED ORDER — DOCUSATE SODIUM 100 MG PO CAPS
100.0000 mg | ORAL_CAPSULE | Freq: Two times a day (BID) | ORAL | Status: DC
  Administered 2015-06-11: 100 mg via ORAL
  Filled 2015-06-09: qty 1

## 2015-06-09 MED ORDER — SODIUM CHLORIDE 0.9 % IV BOLUS (SEPSIS)
1000.0000 mL | Freq: Once | INTRAVENOUS | Status: AC
Start: 2015-06-09 — End: 2015-06-09
  Administered 2015-06-09: 1000 mL via INTRAVENOUS

## 2015-06-09 MED ORDER — DEXTROSE 5 % IV SOLN
1.0000 g | Freq: Once | INTRAVENOUS | Status: AC
  Administered 2015-06-09: 1 g via INTRAVENOUS
  Filled 2015-06-09: qty 10

## 2015-06-09 MED ORDER — POLYETHYLENE GLYCOL 3350 17 G PO PACK: 17.0000 g | PACK | Freq: Every day | ORAL | Status: DC

## 2015-06-09 MED ORDER — SODIUM CHLORIDE 0.9 % IV SOLN
INTRAVENOUS | Status: DC
  Administered 2015-06-09: 19:00:00 via INTRAVENOUS
  Filled 2015-06-09: qty 1000

## 2015-06-09 MED ORDER — METOPROLOL TARTRATE 1 MG/ML IV SOLN
2.5000 mg | Freq: Four times a day (QID) | INTRAVENOUS | Status: DC
  Administered 2015-06-09 – 2015-06-11 (×7): 2.5 mg via INTRAVENOUS
  Filled 2015-06-09 (×6): qty 5

## 2015-06-09 MED ORDER — SODIUM CHLORIDE 0.9 % IV BOLUS (SEPSIS)
1000.0000 mL | Freq: Once | INTRAVENOUS | Status: AC
  Administered 2015-06-09: 1000 mL via INTRAVENOUS

## 2015-06-09 NOTE — Telephone Encounter (Signed)
10 year ASCVD risk 4.1%. With DM, she should be on moderate-intensity statin with recheck in 1 year. Will call her about this once she is out of the hospital (currently in the ED). Hilton Sinclair, MD

## 2015-06-09 NOTE — ED Notes (Signed)
Attempted report to [facility]N 

## 2015-06-09 NOTE — ED Provider Notes (Signed)
CSN: [id number]     Arrival date & time 06/09/15  8546 History   First MD Initiated Contact with Patient 06/09/15 [phone]     Chief Complaint  Patient presents with  . Emesis  . Abdominal Pain     (Consider location/radiation/quality/duration/timing/severity/associated sxs/prior Treatment) HPI [name] [name] is a 49 year old female past medical history of pancreatitis, cyclic vomiting syndrome, hypertension, MI, GERD who present to the ER with one day of nausea, vomiting, abdominal pain.  Patient states she was watching her grandbaby be put to sleep for a minor procedure when she became very stressed out, became very nauseated, began experiencing abdominal pain and vomited 3-4 times. Patient states her son's symptoms feel consistent with an identical to previous episodes of cyclic vomiting syndrome that she has been experiencing for the past 14 years. Patient states she also has been out of her prescribed Ativan that she takes twice a day for same. She states she has not had Ativan for the past 5 days. Patient denies any new or different symptoms. Patient denies any chest pain, shortness of breath, headache, blurred vision, dizziness, weakness, fever, diarrhea, dysuria.  Past Medical History  Diagnosis Date  . PANCREATITIS 05/09/2008  . Cyclic vomiting syndrome   . Hypertension   . Complication of anesthesia     "problems waking up"  . Heart murmur   . Myocardial infarction 07/2007    "they say I've had a silent one; I don't know"  . Pneumonia   . Anemia   . Blood transfusion   . GERD (gastroesophageal reflux disease)   . Anxiety   . Depression   . Smoker   . Marijuana smoker, continuous   . NEUROPATHY, DIABETIC 02/23/2007  . Type II diabetes mellitus   . Diabetic gastroparesis     /e-chart  . Chronic kidney disease (CKD), stage III (moderate)    Past Surgical History  Procedure Laterality Date  . Port-a-cath placement  ~ 2008    right chest; "poor access; frequent hospitalizations"   . Laparotomy and lysis of adhesions    . Abdominal hysterectomy  02/2002  . Cholecystectomy  1980's  . Cardiac catheterization    . Dilation and evacuation  X3  . Left hand surgery    . Cataract extraction w/ intraocular lens  implant, bilateral    . Colonoscopy    . Upper gi endoscopy     Family History  Problem Relation Age of Onset  . Diabetes type II Mother   . Diabetes Mother   . Hypertension Mother   . Diabetes type II Sister   . Diabetes Sister   . Hypertension Sister   . Hypertension Brother    History  Substance Use Topics  . Smoking status: Current Some Day Smoker -- 0.50 packs/day for 22 years    Types: Cigarettes  . Smokeless tobacco: Never Used  . Alcohol Use: No   OB History    No data available     Review of Systems  Constitutional: Negative for fever.  HENT: Negative for trouble swallowing.   Eyes: Negative for visual disturbance.  Respiratory: Negative for shortness of breath.   Cardiovascular: Negative for chest pain.  Gastrointestinal: Positive for nausea, vomiting and abdominal pain.  Genitourinary: Negative for dysuria.  Musculoskeletal: Negative for neck pain.  Skin: Negative for rash.  Neurological: Negative for dizziness, weakness and numbness.  Psychiatric/Behavioral: Negative.       Allergies  Bee venom; Acetaminophen; Novolog; Other; Tegaderm ag mesh; and Erythromycin  Home  Medications   Prior to Admission medications   Medication Sig Start Date End Date Taking? Authorizing Provider  aspirin EC 81 MG tablet Take 1 tablet (81 mg total) by mouth daily. 06/02/15  Yes Hilton Sinclair, MD  carvedilol (COREG) 25 MG tablet Take 1 tablet (25 mg total) by mouth 2 (two) times daily with a meal. 06/02/15  Yes Hilton Sinclair, MD  blood glucose meter kit and supplies KIT Dispense based on patient and insurance preference. Use up to four times daily as directed. (FOR ICD-9 250.00, 250.01). 06/02/15   Hilton Sinclair, MD  cephALEXin  (KEFLEX) 500 MG capsule Take 1 capsule (500 mg total) by mouth 2 (two) times daily. 03/25/15   Hilton Sinclair, MD  cetirizine (ZYRTEC) 10 MG tablet Take 1 tablet (10 mg total) by mouth daily. 06/02/15   Hilton Sinclair, MD  cholecalciferol (VITAMIN D) 400 UNITS TABS tablet Take 2 tablets (800 Units total) by mouth daily. 02/16/15   Hilton Sinclair, MD  ciprofloxacin (CIPRO) 500 MG tablet Take 1 tablet (500 mg total) by mouth 2 (two) times daily. 04/24/15   Elbert, MD  fluconazole (DIFLUCAN) 150 MG tablet Take 1 tablet (150 mg total) by mouth once. Repeat in 1 week if needed. 04/25/15   Sharon [address], MD  hydrALAZINE (APRESOLINE) 10 MG tablet Take 1 tablet (10 mg total) by mouth 3 (three) times daily. 06/02/15   Hilton Sinclair, MD  insulin glargine (LANTUS) 100 UNIT/ML injection Inject 0.22 mLs (22 Units total) into the skin daily. 06/02/15   Hilton Sinclair, MD  lisinopril (PRINIVIL,ZESTRIL) 40 MG tablet Take 1 tablet (40 mg total) by mouth daily. 06/02/15   Hilton Sinclair, MD  LORazepam (ATIVAN) 1 MG tablet Take 1 tablet (1 mg total) by mouth 2 (two) times daily as needed (cyclic vomiting symptoms). Fill on/after 06/10/15. 05/08/15   Hilton Sinclair, MD  LYRICA 25 MG capsule TAKE 1 TABLET BY MOUTH AT BEDTIME AS NEEDED 04/28/15   Renee A Kuneff, DO  metoCLOPramide (REGLAN) 10 MG tablet Take 1 tablet (10 mg total) by mouth every 6 (six) hours as needed for nausea. 11/25/14   Lajean Saver, MD  metroNIDAZOLE (FLAGYL) 500 MG tablet Take 1 tablet (500 mg total) by mouth 3 (three) times daily. 04/24/15   [place], MD  metroNIDAZOLE (METROGEL) 0.75 % gel Apply 1 application topically 2 (two) times daily. For 5 days. 11/27/14   Hilton Sinclair, MD  oxyCODONE (OXY IR/ROXICODONE) 5 MG immediate release tablet Take one tab by mouth up to twice a day as needed for severe pain 05/08/15   Hilton Sinclair, MD  polyethylene glycol [facility] / Floria Raveling)  packet Take 17 g by mouth daily as needed. May increase in 2-3 days to 2 times daily, Until stooling regularly 06/02/15   Hilton Sinclair, MD  promethazine (PHENERGAN) 25 MG suppository Place 1 suppository (25 mg total) rectally every 6 (six) hours as needed for nausea. 03/25/15   Hilton Sinclair, MD  promethazine (PHENERGAN) 25 MG tablet Take 1 tablet (25 mg total) by mouth every 6 (six) hours as needed for nausea or vomiting. 04/30/15   Waynetta Pean, PA-C  SUMAtriptan (IMITREX) 50 MG tablet Take 50 mg by mouth every 2 (two) hours as needed for migraine. May repeat in 2 hours if headache persists or recurs.    Historical Provider, MD   BP 183/65 mmHg  Pulse 93  Temp(Src) 100.5 F (  38.1 C) (Oral)  Resp 25  Ht '5\' 1"'  (1.549 m)  Wt 154 lb (69.854 kg)  BMI 29.11 kg/m2  SpO2 98% Physical Exam  Constitutional: She is oriented to person, place, and time. She appears well-developed and well-nourished.  Non-toxic appearance. She does not have a sickly appearance.  Patient in moderate amount of distress due to pain and discomfort.  HENT:  Head: Normocephalic and atraumatic.  Mouth/Throat: Oropharynx is clear and moist. No oropharyngeal exudate.  Eyes: Right eye exhibits no discharge. Left eye exhibits no discharge. No scleral icterus.  Neck: Normal range of motion.  Cardiovascular: Normal rate, regular rhythm and normal heart sounds.   No murmur heard. Pulmonary/Chest: Effort normal and breath sounds normal. No respiratory distress.  Abdominal: Soft. There is tenderness in the epigastric area. There is no rigidity, no guarding, no tenderness at McBurney's point and negative Murphy's sign.  Epigastric tenderness, consistent with patient's chronic episodes-- as reported by patient.   Musculoskeletal: Normal range of motion. She exhibits no edema or tenderness.  Neurological: She is alert and oriented to person, place, and time. No cranial nerve deficit. Coordination normal.  Skin: Skin is  warm and dry. No rash noted. She is not diaphoretic.  Psychiatric: She has a normal mood and affect.  Nursing note and vitals reviewed.   ED Course  Procedures (including critical care time) Labs Review Labs Reviewed  CBC WITH DIFFERENTIAL/PLATELET - Abnormal; Notable for the following:    RBC 3.65 (*)    Hemoglobin 11.3 (*)    HCT 32.8 (*)    Monocytes Absolute 1.1 (*)    All other components within normal limits  COMPREHENSIVE METABOLIC PANEL - Abnormal; Notable for the following:    Glucose, Bld 175 (*)    BUN 33 (*)    Creatinine, Ser 2.20 (*)    ALT 11 (*)    GFR calc non Af Amer 25 (*)    GFR calc Af Amer 29 (*)    All other components within normal limits  URINALYSIS, ROUTINE W REFLEX MICROSCOPIC (NOT AT [facility]) - Abnormal; Notable for the following:    Hgb urine dipstick SMALL (*)    Protein, ur 30 (*)    All other components within normal limits  URINE MICROSCOPIC-ADD ON - Abnormal; Notable for the following:    Squamous Epithelial / LPF FEW (*)    All other components within normal limits  URINE CULTURE  LIPASE, BLOOD  I-STAT TROPOININ, ED  I-STAT CG4 LACTIC ACID, ED    Imaging Review Ct Abdomen Pelvis Wo Contrast  06/09/2015   CLINICAL DATA:  Upper abdominal pain, nausea, and vomiting; history hypertension, pancreatitis, MI, GERD, type II diabetes with gastroparesis, smoker  EXAM: CT ABDOMEN AND PELVIS WITHOUT CONTRAST  TECHNIQUE: Multidetector CT imaging of the abdomen and pelvis was performed following the standard protocol without IV contrast. Sagittal and coronal MPR images reconstructed from axial data set. Oral contrast was not administered.  COMPARISON:  01/31/2015  FINDINGS: Minimal patchy infiltrate in both lower lobes, new.  Gallbladder and uterus surgically absent with nonvisualization of ovaries.  Mild dilatation of the RIGHT renal collecting system and proximal RIGHT ureter without definite calcification or point of obstruction.  Mild BILATERAL perinephric  stranding.  Bladder wall thickening slightly increased versus previous study though bladder is less distended.  Liver, spleen, pancreas, and adrenal glands unremarkable for technique.  Specifically note definite features of acute pancreatitis identified by noncontrast CT  Increased stool in colon.  Stomach and  small bowel loops grossly unremarkable for technique.  Normal appendix.  No mass, adenopathy, free fluid or free air.  No acute osseous findings.  IMPRESSION: Bladder wall thickening question due to infection, infiltrative process or incomplete distention.  Persistent mild RIGHT hydronephrosis and proximal RIGHT ureteral dilatation with BILATERAL perinephric stranding ; these changes could be related to urinary tract infection, and correlation with urinalysis is recommended.  Slightly increased stool in proximal colon.  New minimal patchy bibasilar pulmonary infiltrates concerning for pneumonia.   Electronically Signed   By: Lavonia Dana M.D.   On: 06/09/2015 16:27     EKG Interpretation   Date/Time:  Monday June 09 2015 10:52:23 EDT Ventricular Rate:  82 PR Interval:  156 QRS Duration: 78 QT Interval:  362 QTC Calculation: 423 R Axis:   79 Text Interpretation:  Sinus rhythm Probable left atrial enlargement  Anterior infarct, old Artifact Confirmed by Hazle Coca [phone]) on 06/09/2015  11:05:07 AM      MDM   Final diagnoses:  Upper abdominal pain  Intractable cyclical vomiting with nausea    Patient signed symptoms consistent with cyclic vomiting syndrome patient has experienced in the past. Patient ALSO noted to be out of her prescribed Ativan which she typically takes for anxiety. Patient stating stress and anxiety typically causes her symptoms to develop. Plan to treat patient symptomatically, we'll check labs.  2:45 PM: Labs appear to be at baseline for patient. Patient continues to vomit throughout her stay here after 2 doses of Phenergan, 2 doses of Ativan. Patient began to run  fever of 100.17F. Given patient's upper abdominal pain, persistent/intractable nausea and vomiting, will follow up with CT scan.  CT abdomen pelvis with impression:  Bladder wall thickening question due to infection, infiltrative process or incomplete distention.  Persistent mild RIGHT hydronephrosis and proximal RIGHT ureteral dilatation with BILATERAL perinephric stranding ; these changes could be related to urinary tract infection, and correlation with urinalysis is recommended.  Slightly increased stool in proximal colon.  New minimal patchy bibasilar pulmonary infiltrates concerning for pneumonia.  Will admit patient for intractable nausea and vomiting along with community-acquired pneumonia. Likely perinephric stranding associated with patient's chronic renal disease. Spoke with family medicine who agrees to admit patient. The patient appears reasonably stabilized for admission considering the current resources, flow, and capabilities available in the ED at this time, and I doubt any other [facility] requiring further screening and/or treatment in the ED prior to admission.  BP 183/65 mmHg  Pulse 93  Temp(Src) 100.5 F (38.1 C) (Oral)  Resp 25  Ht '5\' 1"'  (1.549 m)  Wt 154 lb (69.854 kg)  BMI 29.11 kg/m2  SpO2 98%  Signed,  Dahlia Bailiff, PA-C 5:46 PM  Patient seen and discussed with Dr. Quintella Reichert, MD   Dahlia Bailiff, PA-C 06/09/15 [place], MD 06/10/15 [phone]

## 2015-06-09 NOTE — ED Notes (Signed)
Patient stated that she's not able to urinate at this time, but aware we need a Urinalysis.

## 2015-06-09 NOTE — H&P (Signed)
[facility] Admission History and Physical Service Pager: [phone]  Patient name: [name] [name] Medical record number: [id number] Date of birth: 11-May-1966 Age: 49 y.o. Gender: female  Primary Care Provider: Conni Slipper, MD Consultants: none Code Status: FULL  Chief Complaint: intractable nausea and vomiting, PNA found on CT   Assessment and Plan: [name] [name] is a 49 y.o. female presenting with nausea and vomiting . PMH is significant for cyclic vomiting syndrome, diabetes mellitus type 2, anxiety, acute pancreatitis, HTN, CKD.  Nausea and vomiting: similar to previous episodes. Likely cyclic vomiting (she refuses to believe marijuana contributes to symptoms and smoked a couple days ago).  Patient currently stable. S/p 2L bolus in ED. EKG unchanged from previous.  QTc nml. CT abd with bladder wall thickening, mild persistent R hydronephrosis and proximal R ureteral dilation with b/l perinephric stranding ? UTI vs from recent UTI. UA with small hgb, 30 protein.  No bacteria, leuks or nitrites. Not endorsing urinary sx's. Normal LA, LFTs, and poc trop. -Admit under Dr. Erin [name] -VS per floor protocol -cardiac monitoring, see HTN below -Ativan 0.76m IV q6hrs PRN, would consider sublingual administration once tolerating PO; monitor for oversedation -Phenergan 12.546mIV q8hrs PRN -Dilaudid 0.88m4mV q4h PRNsevere abdominal pain -NPO for now, advance diet as tolerated -IVF _0  until tolerating PO -Gastroccult testing given report of dark emesis by nurse though on review of it, not coffee ground or grossly bloody -PPI, consider GI cocktail -Urine culture pending  Community Acquired pneumonia: Febrile to 102.83F in ED.  No leukocytosis.  Breathing well on RA.  CT with minimal patchy b/l infiltrates. ?early pna.  Currently not endorsing SOB, cough.    -s/p Rocephin and Zithromax IV in ED   -Transition to oral abx when able.   -monitor fever curve;  blood culture x 2 ordered (collected prior to abx per nursing)  Diabetes mellitus, type 2:  Blood sugar elevated to 175. Last A1C of 9.4 on 04/04/2014. On Lantus at home -Hold home lantus in setting of NPO -Sensitive sliding scale -Monitor CBGs -BMET in am  Hypertension: BP 202/91. On Coreg 25 BID, Hydralazine 88m47mD, Lisinopril 40mg66m-Hold home meds, NPO -Hydralazine 88mg q70mRN SBP>180, DBP>100.   -Lopressor 2.88mg q649mMonitor BPs, increase lopressor as needed -Resume home meds when able  Anemia: baseline of around 10-12. 11.3 on admission. -Monitor CBC  Constipation -Colace, Miralax  CKD: Baseline creatinine of around 2. Creatinine on admission of 2.20 -Follow-up Bmet in AM   -IVFs  FEN/GI: NPO, possible clears in AM if improved sx, NS IVF _1 /hr, PPI Prophylaxis: heparin subq   Disposition: admit to inpatient for cyclic vomiting, hydration and pneumonia management  History of Present Illness: [name] CHRISTIANA GUREVICH1 y.o.41emale presenting with cyclic vomiting and found to have pna.  Patient reports that she has had intermittent vomiting for about 1 week.  She reports that symptoms were worse this morning.  She reports epigastric pain that is 10/10.  She endorses subjective fevers and chills.  She denies hematemesis, diarrhea, cough, SOB, CP, dizziness, recent ETOH use.  She reports that she has been tolerating PO ok.  She also reports continued marijuana use.  Most recent use a couple of days ago.  Denies sick contacts.  In the ED, patient had a CT abdomen/pelvis that showed no acute abdominal findings but did show what appeared to be mild changes consistent with b/l lower lobe pneumonia.  Patient with fever 102.83F  in ED.  She received 2L NS bolus, rocephin and azithromycin.  She also received ativan 91m.  She failed PO challenge and continued to vomit.  Review Of Systems: Per HPI with the following additions: none Otherwise 12 point review of systems was performed and  was unremarkable.  Patient Active Problem List   Diagnosis Date Noted  . Constipation 06/06/2015  . Onychomycosis 03/25/2015  . Nausea with vomiting   . Right flank pain   . Encounter for chronic pain management 01/06/2015  . Vitamin D deficiency 11/28/2014  . At risk for polypharmacy 08/15/2014  . Other and unspecified hyperlipidemia 08/15/2014  . Leg cramping 07/18/2014  . Rotator cuff syndrome 04/19/2014  . Biceps tendinopathy of right upper extremity 04/05/2014  . Protein-calorie malnutrition, severe 12/17/2013  . Anemia 11/15/2013  . Pancreatitis, acute 10/14/2013  . CKD (chronic kidney disease) 08/17/2013  . Abdominal pain, epigastric 04/19/2013  . Esophagitis 04/18/2013  . Preventative health care 09/18/2012  . High risk social situation 08/31/2012  . Allergic rhinitis, seasonal 05/04/2012  . Marijuana smoker 11/03/2011  . Uncontrolled secondary diabetes mellitus with stage 3 CKD (GFR 30-59) 10/23/2011  . Cyclic vomiting syndrome 08/03/2011  . Hot flashes 07/29/2011  . Backache 04/10/2007  . DEPRESSION, MAJOR, RECURRENT 02/23/2007  . Anxiety state 02/23/2007  . TOBACCO DEPENDENCE 02/23/2007  . MIGRAINE, UNSPEC., W/O INTRACTABLE MIGRAINE 02/23/2007  . Essential hypertension, malignant 02/23/2007  . INSOMNIA NOS 02/23/2007  . NEUROPATHY, DIABETIC 02/23/2007  . GERD 01/27/2004   Past Medical History: Past Medical History  Diagnosis Date  . PANCREATITIS 05/09/2008  . Cyclic vomiting syndrome   . Hypertension   . Complication of anesthesia     "problems waking up"  . Heart murmur   . Myocardial infarction 07/2007    "they say I've had a silent one; I don't know"  . Pneumonia   . Anemia   . Blood transfusion   . GERD (gastroesophageal reflux disease)   . Anxiety   . Depression   . Smoker   . Marijuana smoker, continuous   . NEUROPATHY, DIABETIC 02/23/2007  . Type II diabetes mellitus   . Diabetic gastroparesis     /e-chart  . Chronic kidney disease (CKD),  stage III (moderate)    Past Surgical History: Past Surgical History  Procedure Laterality Date  . Port-a-cath placement  ~ 2008    right chest; "poor access; frequent hospitalizations"  . Laparotomy and lysis of adhesions    . Abdominal hysterectomy  02/2002  . Cholecystectomy  1980's  . Cardiac catheterization    . Dilation and evacuation  X3  . Left hand surgery    . Cataract extraction w/ intraocular lens  implant, bilateral    . Colonoscopy    . Upper gi endoscopy     Social History: History  Substance Use Topics  . Smoking status: Current Some Day Smoker -- 0.50 packs/day for 22 years    Types: Cigarettes  . Smokeless tobacco: Never Used  . Alcohol Use: No   Additional social history: smokes marijuana, most recently reportedly this morning vs 3 days ago (t[place] different things) Please also refer to relevant sections of EMR.  Family History: Family History  Problem Relation Age of Onset  . Diabetes type II Mother   . Diabetes Mother   . Hypertension Mother   . Diabetes type II Sister   . Diabetes Sister   . Hypertension Sister   . Hypertension Brother    Allergies and Medications:  Allergies  Allergen Reactions  . Bee Venom Anaphylaxis  . Acetaminophen Nausea And Vomiting  . Novolog [Insulin Aspart] Other (See Comments)    Muscles in feet cramp  . Other     Sorbaview causes rash  . Tegaderm Ag Mesh [Silver]     Blisters  . Erythromycin Nausea And Vomiting   Current Facility-Administered Medications on File Prior to Encounter  Medication Dose Route Frequency Provider Last Rate Last Dose  . 0.9 %  sodium chloride infusion   Intravenous Continuous Kinnie Feil, MD 20 mL/hr at 04/19/13 1500 500 mL at 04/19/13 1500   Current Outpatient Prescriptions on File Prior to Encounter  Medication Sig Dispense Refill  . aspirin EC 81 MG tablet Take 1 tablet (81 mg total) by mouth daily. 90 tablet 3  . blood glucose meter kit and supplies KIT Dispense based on  patient and insurance preference. Use up to four times daily as directed. (FOR ICD-9 250.00, 250.01). 1 each 0  . carvedilol (COREG) 25 MG tablet Take 1 tablet (25 mg total) by mouth 2 (two) times daily with a meal. 60 tablet 2  . cephALEXin (KEFLEX) 500 MG capsule Take 1 capsule (500 mg total) by mouth 2 (two) times daily. 20 capsule 0  . cetirizine (ZYRTEC) 10 MG tablet Take 1 tablet (10 mg total) by mouth daily. 30 tablet 11  . cholecalciferol (VITAMIN D) 400 UNITS TABS tablet Take 2 tablets (800 Units total) by mouth daily. 60 each 2  . ciprofloxacin (CIPRO) 500 MG tablet Take 1 tablet (500 mg total) by mouth 2 (two) times daily. 20 tablet 0  . fluconazole (DIFLUCAN) 150 MG tablet Take 1 tablet (150 mg total) by mouth once. Repeat in 1 week if needed. 2 tablet 0  . hydrALAZINE (APRESOLINE) 10 MG tablet Take 1 tablet (10 mg total) by mouth 3 (three) times daily. 90 tablet 2  . insulin glargine (LANTUS) 100 UNIT/ML injection Inject 0.22 mLs (22 Units total) into the skin daily. 10 mL 2  . lisinopril (PRINIVIL,ZESTRIL) 40 MG tablet Take 1 tablet (40 mg total) by mouth daily. 30 tablet 2  . LORazepam (ATIVAN) 1 MG tablet Take 1 tablet (1 mg total) by mouth 2 (two) times daily as needed (cyclic vomiting symptoms). Fill on/after 06/10/15. 45 tablet 0  . LYRICA 25 MG capsule TAKE 1 TABLET BY MOUTH AT BEDTIME AS NEEDED 30 capsule 1  . metoCLOPramide (REGLAN) 10 MG tablet Take 1 tablet (10 mg total) by mouth every 6 (six) hours as needed for nausea. 30 tablet 0  . metroNIDAZOLE (FLAGYL) 500 MG tablet Take 1 tablet (500 mg total) by mouth 3 (three) times daily. 21 tablet 0  . metroNIDAZOLE (METROGEL) 0.75 % gel Apply 1 application topically 2 (two) times daily. For 5 days. 45 g 0  . oxyCODONE (OXY IR/ROXICODONE) 5 MG immediate release tablet Take one tab by mouth up to twice a day as needed for severe pain 15 tablet 0  . polyethylene glycol (MIRALAX / GLYCOLAX) packet Take 17 g by mouth daily as needed. May  increase in 2-3 days to 2 times daily, Until stooling regularly 30 packet prn  . promethazine (PHENERGAN) 25 MG suppository Place 1 suppository (25 mg total) rectally every 6 (six) hours as needed for nausea. 20 each 0  . promethazine (PHENERGAN) 25 MG tablet Take 1 tablet (25 mg total) by mouth every 6 (six) hours as needed for nausea or vomiting. 12 tablet 0  . SUMAtriptan (IMITREX) 50  MG tablet Take 50 mg by mouth every 2 (two) hours as needed for migraine. May repeat in 2 hours if headache persists or recurs.      Objective: BP 183/65 mmHg  Pulse 93  Temp(Src) 100.5 F (38.1 C) (Oral)  Resp 25  Ht _0  (1.549 m)  Wt 154 lb (69.854 kg)  BMI 29.11 kg/m2  SpO2 98% Exam: General: drowsy appearing, NAD Eyes: EOMI, sclera clear, PERRLA ENTM: MM mildly dry, o/p clear, poor dentition Cardiovascular: RRR, no murmurs Respiratory: CTAB, no wheeze, rhonchi or rales Abdomen: soft, ND, +TTP LUQ and epigastric areas, no peritoneal signs, + hypoactive bowel sounds MSK: moves extremities independently, no edema Skin: no rashes Neuro: follows commands but drowsy, moves all extremities purposefully, good trunk tone Psych: intermittently falling asleep during exam  Labs and Imaging: CBC BMET   Recent Labs Lab 06/09/15 1013  WBC 10.1  HGB 11.3*  HCT 32.8*  PLT 187    Recent Labs Lab 06/09/15 1013  NA 141  K 4.4  CL 111  CO2 23  BUN 33*  CREATININE 2.20*  GLUCOSE 175*  CALCIUM 9.8     Ct Abdomen Pelvis Wo Contrast  06/09/2015   CLINICAL DATA:  Upper abdominal pain, nausea, and vomiting; history hypertension, pancreatitis, MI, GERD, type II diabetes with gastroparesis, smoker  EXAM: CT ABDOMEN AND PELVIS WITHOUT CONTRAST  TECHNIQUE: Multidetector CT imaging of the abdomen and pelvis was performed following the standard protocol without IV contrast. Sagittal and coronal MPR images reconstructed from axial data set. Oral contrast was not administered.  COMPARISON:  01/31/2015   FINDINGS: Minimal patchy infiltrate in both lower lobes, new.  Gallbladder and uterus surgically absent with nonvisualization of ovaries.  Mild dilatation of the RIGHT renal collecting system and proximal RIGHT ureter without definite calcification or point of obstruction.  Mild BILATERAL perinephric stranding.  Bladder wall thickening slightly increased versus previous study though bladder is less distended.  Liver, spleen, pancreas, and adrenal glands unremarkable for technique.  Specifically note definite features of acute pancreatitis identified by noncontrast CT  Increased stool in colon.  Stomach and small bowel loops grossly unremarkable for technique.  Normal appendix.  No mass, adenopathy, free fluid or free air.  No acute osseous findings.  IMPRESSION: Bladder wall thickening question due to infection, infiltrative process or incomplete distention.  Persistent mild RIGHT hydronephrosis and proximal RIGHT ureteral dilatation with BILATERAL perinephric stranding ; these changes could be related to urinary tract infection, and correlation with urinalysis is recommended.  Slightly increased stool in proximal colon.  New minimal patchy bibasilar pulmonary infiltrates concerning for pneumonia.   Electronically Signed   By: Lavonia Dana M.D.   On: 06/09/2015 16:27   Janora Norlander, DO 06/09/2015, 5:02 PM PGY-1, [place] Intern pager: [phone], text pages welcome  I have seen and examined this patient with Dr Lajuana Ripple and agree with her assessment and plan with my additions in blue.  Hilton Sinclair, MD PGY-3, [place]

## 2015-06-09 NOTE — Progress Notes (Signed)
Took patients BP 3 times and all were over 200

## 2015-06-09 NOTE — ED Notes (Signed)
Pt states she woke up this morning feeling nauseous. Pt states she has cyclic vomiting and ran out of ativan 5 days ago and has since gotten sick. Pt states her 45 pills don't last all month and she gets sick. Pt states she began vomiting at 0800 this morning and has 3 episodes. Pt is rolling around in the bed moaning.

## 2015-06-09 NOTE — ED Notes (Signed)
Joe, PA made aware of pt fever and current condition. Pt is also coughing and spitting into an emesis bag on the side of the bed currently

## 2015-06-10 ENCOUNTER — Encounter (HOSPITAL_COMMUNITY): Payer: Self-pay | Admitting: General Practice

## 2015-06-10 ENCOUNTER — Inpatient Hospital Stay (HOSPITAL_COMMUNITY): Payer: Medicare Other

## 2015-06-10 DIAGNOSIS — J189 Pneumonia, unspecified organism: Secondary | ICD-10-CM

## 2015-06-10 DIAGNOSIS — G43A1 Cyclical vomiting, intractable: Principal | ICD-10-CM

## 2015-06-10 DIAGNOSIS — F411 Generalized anxiety disorder: Secondary | ICD-10-CM

## 2015-06-10 LAB — GLUCOSE, CAPILLARY
GLUCOSE-CAPILLARY: 111 mg/dL — AB (ref 65–99)
GLUCOSE-CAPILLARY: 73 mg/dL (ref 65–99)
Glucose-Capillary: 218 mg/dL — ABNORMAL HIGH (ref 65–99)
Glucose-Capillary: 218 mg/dL — ABNORMAL HIGH (ref 65–99)
Glucose-Capillary: 19 mg/dL — CL (ref 65–99)
Glucose-Capillary: 22 mg/dL — CL (ref 65–99)
Glucose-Capillary: 106 mg/dL — ABNORMAL HIGH (ref 65–99)
Glucose-Capillary: 182 mg/dL — ABNORMAL HIGH (ref 65–99)

## 2015-06-10 LAB — BASIC METABOLIC PANEL
ANION GAP: 13 (ref 5–15)
BUN: 32 mg/dL — ABNORMAL HIGH (ref 6–20)
CHLORIDE: 109 mmol/L (ref 101–111)
CO2: 19 mmol/L — AB (ref 22–32)
CREATININE: 1.94 mg/dL — AB (ref 0.44–1.00)
Calcium: 9 mg/dL (ref 8.9–10.3)
GFR, EST AFRICAN AMERICAN: 34 mL/min — AB (ref >60)
GFR, EST NON AFRICAN AMERICAN: 29 mL/min — AB (ref >60)
Glucose, Bld: 272 mg/dL — ABNORMAL HIGH (ref 65–99)
POTASSIUM: 3.8 mmol/L (ref 3.5–5.1)
Sodium: 141 mmol/L (ref 135–145)

## 2015-06-10 LAB — CBC
HEMATOCRIT: 30.7 % — AB (ref 36.0–46.0)
HEMOGLOBIN: 10.5 g/dL — AB (ref 12.0–15.0)
MCH: 31 pg (ref 26.0–34.0)
MCHC: 34.2 g/dL (ref 30.0–36.0)
MCV: 90.6 fL (ref 78.0–100.0)
Platelets: 168 10*3/uL (ref 150–400)
RBC: 3.39 MIL/uL — ABNORMAL LOW (ref 3.87–5.11)
RDW: 14 % (ref 11.5–15.5)
WBC: 10.2 10*3/uL (ref 4.0–10.5)

## 2015-06-10 MED ORDER — DEXTROSE 50 % IV SOLN
1.0000 | Freq: Once | INTRAVENOUS | Status: AC
  Administered 2015-06-10: 50 mL via INTRAVENOUS

## 2015-06-10 MED ORDER — PROMETHAZINE HCL 25 MG PO TABS: 12.5000 mg | ORAL_TABLET | Freq: Four times a day (QID) | ORAL | Status: DC | PRN

## 2015-06-10 MED ORDER — BOOST / RESOURCE BREEZE PO LIQD
1.0000 | Freq: Three times a day (TID) | ORAL | Status: DC
  Administered 2015-06-11: 1 via ORAL

## 2015-06-10 MED ORDER — DEXTROSE 50 % IV SOLN
INTRAVENOUS | Status: AC
  Filled 2015-06-10: qty 50

## 2015-06-10 MED ORDER — GI COCKTAIL ~~LOC~~
30.0000 mL | Freq: Once | ORAL | Status: DC
  Filled 2015-06-10: qty 30

## 2015-06-10 MED ORDER — LORAZEPAM 0.5 MG PO TABS
0.5000 mg | ORAL_TABLET | Freq: Three times a day (TID) | ORAL | Status: DC | PRN
  Administered 2015-06-10 – 2015-06-11 (×2): 0.5 mg via ORAL
  Filled 2015-06-10 (×3): qty 1

## 2015-06-10 MED ORDER — GI COCKTAIL ~~LOC~~
30.0000 mL | Freq: Once | ORAL | Status: AC
  Administered 2015-06-10: 30 mL via ORAL
  Filled 2015-06-10: qty 30

## 2015-06-10 MED ORDER — PROMETHAZINE HCL 25 MG PO TABS
12.5000 mg | ORAL_TABLET | Freq: Three times a day (TID) | ORAL | Status: DC | PRN
  Administered 2015-06-10: 12.5 mg via ORAL
  Filled 2015-06-10 (×2): qty 1

## 2015-06-10 NOTE — Progress Notes (Signed)
Family Medicine Teaching Service Daily Progress Note Intern Pager: [phone]  Patient name: [name] [name] Medical record number: [id number] Date of birth: [birth year] Age: 49 y.o. Gender: female  Primary Care Provider: Conni Slipper, MD Consultants: None  Code Status: Full  Pt Overview and Major Events to Date:  6/13: N/V.   Assessment and Plan: HENDEL PAMPERIN is a 48 y.o. female presenting with nausea and vomiting . PMH is significant for cyclic vomiting syndrome, diabetes mellitus type 2, anxiety, acute pancreatitis, HTN, CKD.  Nausea and vomiting: similar to previous episodes. Likely cyclic vomiting (she refuses to believe marijuana contributes to symptoms and smoked a couple days ago). Patient currently stable. S/p 2L bolus in ED. EKG unchanged from previous. QTc nml. CT abd with bladder wall thickening, mild persistent R hydronephrosis and proximal R ureteral dilation with b/l perinephric stranding ? UTI vs from recent UTI. UA with small hgb, 30 protein. No bacteria, leuks or nitrites. Not endorsing urinary sx's. Normal LA, LFTs, and poc trop. Patient very frustrated that her Ativan has been decreased in the outpatient setting as she feels this works the best.  -VS per floor protocol -cardiac monitoring -Ativan 0.5mg  PO TID PRN -Phenergan 12.5mg  PO q8hrs PRN -Discontinue Dilaudid 0.5mg  IV q4h PRN severe abdominal pain  -NPO for now, advance diet as tolerated -IVF @100cc  until tolerating PO -Gastroccult testing positive given report of dark emesis by nurse though on review of it, not coffee ground or grossly bloody - Consider GI consult -PPI,  GI cocktail -Urine culture pending  Community Acquired pneumonia: Febrile to 102.66F in ED. No leukocytosis. Breathing well on RA. CT with minimal patchy b/l infiltrates. ?early pna. Currently not endorsing SOB, cough. CXR without  acute cardiopulmonary disease.  -s/p Rocephin and Zithromax IV in ED -  No antibiotics for now    -monitor fever curve; blood culture x 2 ordered (collected prior to abx per nursing)  Diabetes mellitus, type 2: Blood sugar elevated to 175. Last A1C of 9.4 on 04/04/2014. On Lantus at home -Hold home lantus in setting of NPO -Sensitive sliding scale -Monitor CBGs  Hypertension: BP 202/91. On Coreg 25 BID, Hydralazine 10mg  TID, Lisinopril 40mg  qd -Hold home meds, NPO -Hydralazine 5mg  q4 PRN SBP>180, DBP>100.  -Lopressor 2.5mg  q6  -Monitor BPs, increase lopressor as needed -Resume home meds when able  Anemia: baseline of around 10-12. 11.3 on admission. -Monitor CBC  Constipation -Colace, Miralax  CKD: Baseline creatinine of around 2. Creatinine on admission of 2.20 -Follow-up Bmet in AM - SCr 1.94   - VFs  FEN/GI: NPO for possible GI consult,  NS IVF @100ml /hr, PPI Prophylaxis: heparin subq   Disposition: Pending improvement in symptoms  Subjective:  Patient with continued N/V. State Ativan and phenergan make it better. Eating also makes it better. No abdominal pain.  Objective: Temp:  [98.4 F (36.9 C)-102.1 F (38.9 C)] 98.5 F (36.9 C) (06/14 0530) Pulse Rate:  [72-99] 80 (06/14 0530) Resp:  [20-36] 20 (06/14 0530) BP: (134-202)/(53-91) 139/69 mmHg (06/14 0530) SpO2:  [98 %-100 %] 100 % (06/14 0530) Weight:  [154 lb (69.854 kg)] 154 lb (69.854 kg) (06/13 1000) Physical Exam: General: Lying in bed in NAD Cardiovascular: RRR, no m/r/g noted. No pitting edema Respiratory: CTAB without w/r/c. No increased WOB Abdomen: +BS, soft, nondistended. TTP in epigastric and LLQ. No guarding or rebound  Extremities: No tenderness.   Laboratory:  Recent Labs Lab 06/09/15 1013 06/10/15 0420  WBC 10.1 10.2  HGB 11.3* 10.5*  HCT 32.8* 30.7*  PLT 187 168    Recent Labs Lab 06/09/15 1013 06/10/15 0420  NA 141 141  K 4.4 3.8  CL 111 109  CO2 23 19*  BUN 33* 32*  CREATININE 2.20* 1.94*  CALCIUM 9.8 9.0  PROT 7.6  --   BILITOT 1.0  --   ALKPHOS 86  --   ALT  11*  --   AST 17  --   GLUCOSE 175* 272*  Lactic acid 0.93  FOBT (gastro-occult) positive U/A: Protein 30, 3-6RBC, few squams, neg LE and nitrite    Imaging/Diagnostic Tests: 06/09/2015 CLINICAL DATA: Upper abdominal pain, nausea, and vomiting; history hypertension, pancreatitis, MI, GERD, type II diabetes with gastroparesis, smoker EXAM: CT ABDOMEN AND PELVIS WITHOUT CONTRAST TECHNIQUE: Multidetector CT imaging of the abdomen and pelvis was performed following the standard protocol without IV contrast. Sagittal and coronal MPR images reconstructed from axial data set. Oral contrast was not administered. COMPARISON: 01/31/2015 FINDINGS: Minimal patchy infiltrate in both lower lobes, new. Gallbladder and uterus surgically absent with nonvisualization of ovaries. Mild dilatation of the RIGHT renal collecting system and proximal RIGHT ureter without definite calcification or point of obstruction. Mild BILATERAL perinephric stranding. Bladder wall thickening slightly increased versus previous study though bladder is less distended. Liver, spleen, pancreas, and adrenal glands unremarkable for technique. Specifically note definite features of acute pancreatitis identified by noncontrast CT Increased stool in colon. Stomach and small bowel loops grossly unremarkable for technique. Normal appendix. No mass, adenopathy, free fluid or free air. No acute osseous findings. IMPRESSION: Bladder wall thickening question due to infection, infiltrative process or incomplete distention. Persistent mild RIGHT hydronephrosis and proximal RIGHT ureteral dilatation with BILATERAL perinephric stranding ; these changes could be related to urinary tract infection, and correlation with urinalysis is recommended. Slightly increased stool in proximal colon. New minimal patchy bibasilar pulmonary infiltrates concerning for pneumonia. Electronically Signed By: Lavonia Dana M.D. On: 06/09/2015 16:27   Archie Patten, MD 06/10/2015, 7:47 AM PGY-1, [place] Intern pager: [phone], text pages welcome

## 2015-06-10 NOTE — Progress Notes (Signed)
Paged by RN concerning CBG of 22.   On EMR CBG was 218 on previous check at 5pm and received 3u SSI.  She was feeling hot and CBG was checked and found to be 22. She received 1 amp D50 with improvement to 106.  - Will recheck CBG in 15 minutes and call. - Patient not currently on home insulin, only on sensitive SSI  Archie Patten, MD [facility] Family Medicine Resident  06/10/2015, 9:16 PM

## 2015-06-10 NOTE — Discharge Summary (Signed)
[facility] Discharge Summary  Patient name: [name] [name] Medical record number: [id number] Date of birth: 1966/02/06 Age: 49 y.o. Gender: female Date of Admission: 06/09/2015  Date of Discharge: 06/13/15 Admitting Physician: Lind Covert, MD  Primary Care Provider: Conni Slipper, MD Consultants: None  Indication for Hospitalization: Intractable vomiting  Discharge Diagnoses/Problem List:  Cyclic vomiting  Intractable vomiting Diabetes mellitus, type 2 Hypertension  Constipation  Anemia  CKD   Disposition: Home  Discharge Condition: Stable, improved   Discharge Exam:  Blood pressure 165/54, pulse 75, temperature 99.3 F (37.4 C), temperature source Oral, resp. rate 17, height '5\' 1"'  (1.549 m), weight 154 lb (69.854 kg), SpO2 100 %. Lying in bed sleeping comfortably  RRR, no m/r/g noted. No pitting edema noted CTAB without wheezing, rhonchi, or crackles noted +BS, soft, non distended. TTP diffusely most prominently in the epigastric region. No rebound or guarding  Brief Hospital Course:  [name] [name] is a 49 y/o with a PMH of cyclic vomiting who presented to the ED with 1 week of progressively worsening N/V and epigastric pain, however was tolerating PO okay. She continued to use marijuana.   In the ED, patient had a CT abdomen/pelvis that showed no acute abdominal findings but did show what appeared to be mild changes consistent with b/l lower lobe pneumonia. Patient with fever 102.15F in ED. She received 2L NS bolus, rocephin and azithromycin. She also received ativan 39m. She failed PO challenge and continued to vomit. She was placed in observation for further management.  Due to concerns of dark emesis, a gastro-occult was performed and positive. She was placed on IVF and NPO. Her nausea and vomiting resolved with IV Ativan, dilaudid, and phenergan. Her diet was slowly advanced.  Hemoglobin remained stable.   Given NPO status and a  spell of hypoglycemia down to 19 (was previously 218 on prior check with only 3u SSI recorded, she was not placed on her home Lantus. She was discharged on Lantus 10u and advised to increased as need for home CBGs.   Additionally, the patient was not experiencing any URI symptoms and CXR was done which revealed no acute cardiopulmonary process, therefore she was not continued on antibiotics.    Issues for Follow Up:  1. If pt continues to have more flares, could consider propanol or amitriptyline. 2. Could consider intranasal sumatriptan 22monce in the future for abortive therapy (noted to have some improvement in patient's with a h/o migraines). 3. Continue to stress marijuana cessation.   Significant Procedures: none   Significant Labs and Imaging:   Recent Labs Lab 06/09/15 1013 06/10/15 0420  WBC 10.1 10.2  HGB 11.3* 10.5*  HCT 32.8* 30.7*  PLT 187 168    Recent Labs Lab 06/09/15 1013 06/10/15 0420  NA 141 141  K 4.4 3.8  CL 111 109  CO2 23 19*  GLUCOSE 175* 272*  BUN 33* 32*  CREATININE 2.20* 1.94*  CALCIUM 9.8 9.0  ALKPHOS 86  --   AST 17  --   ALT 11*  --   ALBUMIN 4.1  --   Lactic acid 0.93  FOBT (gastro-occult) positive U/A: Protein 30, 3-6RBC, few squams, neg LE and nitrite  Urine culture: 25,000 colonies of multiple bacterial morphotypes Blood cultures: NGTD   06/09/2015 CLINICAL DATA: Upper abdominal pain, nausea, and vomiting; history hypertension, pancreatitis, MI, GERD, type II diabetes with gastroparesis, smoker EXAM: CT ABDOMEN AND PELVIS WITHOUT CONTRAST TECHNIQUE: Multidetector CT imaging of the abdomen  and pelvis was performed following the standard protocol without IV contrast. Sagittal and coronal MPR images reconstructed from axial data set. Oral contrast was not administered. COMPARISON: 01/31/2015 FINDINGS: Minimal patchy infiltrate in both lower lobes, new. Gallbladder and uterus surgically absent with nonvisualization of ovaries.  Mild dilatation of the RIGHT renal collecting system and proximal RIGHT ureter without definite calcification or point of obstruction. Mild BILATERAL perinephric stranding. Bladder wall thickening slightly increased versus previous study though bladder is less distended. Liver, spleen, pancreas, and adrenal glands unremarkable for technique. Specifically note definite features of acute pancreatitis identified by noncontrast CT Increased stool in colon. Stomach and small bowel loops grossly unremarkable for technique. Normal appendix. No mass, adenopathy, free fluid or free air. No acute osseous findings. IMPRESSION: Bladder wall thickening question due to infection, infiltrative process or incomplete distention. Persistent mild RIGHT hydronephrosis and proximal RIGHT ureteral dilatation with BILATERAL perinephric stranding ; these changes could be related to urinary tract infection, and correlation with urinalysis is recommended. Slightly increased stool in proximal colon. New minimal patchy bibasilar pulmonary infiltrates concerning for pneumonia.  CXR: No acute cardiopulmonary disease  Results/Tests Pending at Time of Discharge: Blood cultures  Discharge Medications:    Medication List    ASK your doctor about these medications        aspirin EC 81 MG tablet  Take 1 tablet (81 mg total) by mouth daily.     blood glucose meter kit and supplies Kit  Dispense based on patient and insurance preference. Use up to four times daily as directed. (FOR ICD-9 250.00, 250.01).     carvedilol 25 MG tablet  Commonly known as:  COREG  Take 1 tablet (25 mg total) by mouth 2 (two) times daily with a meal.     cetirizine 10 MG tablet  Commonly known as:  ZYRTEC  Take 1 tablet (10 mg total) by mouth daily.     hydrALAZINE 10 MG tablet  Commonly known as:  APRESOLINE  Take 1 tablet (10 mg total) by mouth 3 (three) times daily.     insulin glargine 100 UNIT/ML injection  Commonly known as:   LANTUS  Inject 0.22 mLs (22 Units total) into the skin daily.     lisinopril 40 MG tablet  Commonly known as:  PRINIVIL,ZESTRIL  Take 1 tablet (40 mg total) by mouth daily.     LORazepam 1 MG tablet  Commonly known as:  ATIVAN  Take 1 tablet (1 mg total) by mouth 2 (two) times daily as needed (cyclic vomiting symptoms). Fill on/after 06/10/15.     LYRICA 25 MG capsule  Generic drug:  pregabalin  TAKE 1 TABLET BY MOUTH AT BEDTIME AS NEEDED     oxyCODONE 5 MG immediate release tablet  Commonly known as:  Oxy IR/ROXICODONE  Take one tab by mouth up to twice a day as needed for severe pain     polyethylene glycol packet  Commonly known as:  MIRALAX / GLYCOLAX  Take 17 g by mouth daily as needed. May increase in 2-3 days to 2 times daily, Until stooling regularly     promethazine 25 MG suppository  Commonly known as:  PHENERGAN  Place 1 suppository (25 mg total) rectally every 6 (six) hours as needed for nausea.        Discharge Instructions: Please refer to Patient Instructions section of EMR for full details.  Patient was counseled important signs and symptoms that should prompt return to medical care, changes in medications, dietary  instructions, activity restrictions, and follow up appointments.   Follow-Up Appointments: Follow-up Information    Follow up with Conni Slipper, MD On 06/20/2015.   Specialty:  Family Medicine   Why:  at 1:45pm for a hospital follow up   Contact information:   [place] [address] [phone]       Archie Patten, MD 06/10/2015, 9:09 PM PGY-1, [place]

## 2015-06-10 NOTE — Progress Notes (Signed)
Hypoglycemic Event  CBG: 22  Treatment: D50 IV 50 mL  Symptoms: Sweaty  Follow-up CBG: Time:2107 CBG Result:106  Possible Reasons for Event: Inadequate meal intake  Comments/MD notified: Crystal Dorsey,MD    Corena Pilgrim C  Remember to initiate Hypoglycemia Order Set & complete

## 2015-06-10 NOTE — Progress Notes (Signed)
Inpatient Diabetes Program Recommendations  AACE/ADA: New Consensus Statement on Inpatient Glycemic Control (2013)  Target Ranges:  Prepandial:   less than 140 mg/dL      Peak postprandial:   less than 180 mg/dL (1-2 hours)      Critically ill patients:  140 - 180 mg/dL   Results for [name] [name], [name] [name] (MRN [id number]) as of 06/10/2015 16:34  Ref. Range 06/09/2015 18:50 06/09/2015 21:02 06/10/2015 08:01 06/10/2015 12:19  Glucose-Capillary Latest Ref Range: 65-99 mg/dL 158 (H) 166 (H) 218 (H) 111 (H)     Results for [name] [name], [name] [name] (MRN [id number]) as of 06/10/2015 16:34  Ref. Range 05/08/2015 09:39  Hemoglobin A1C Unknown 7.4   Recommendations: Change Novolog sensitive to Q4H while NPO or CL. Add 1/2 home basal insulin - Lantus 10 units QHS.  Will follow. Thank you. Lorenda Peck, RD, LDN, CDE Inpatient Diabetes Coordinator [phone]

## 2015-06-11 DIAGNOSIS — N183 Chronic kidney disease, stage 3 (moderate): Secondary | ICD-10-CM

## 2015-06-11 DIAGNOSIS — F1220 Cannabis dependence, uncomplicated: Secondary | ICD-10-CM

## 2015-06-11 DIAGNOSIS — E1329 Other specified diabetes mellitus with other diabetic kidney complication: Secondary | ICD-10-CM

## 2015-06-11 DIAGNOSIS — K219 Gastro-esophageal reflux disease without esophagitis: Secondary | ICD-10-CM

## 2015-06-11 LAB — GLUCOSE, CAPILLARY
GLUCOSE-CAPILLARY: 191 mg/dL — AB (ref 65–99)
Glucose-Capillary: 274 mg/dL — ABNORMAL HIGH (ref 65–99)
Glucose-Capillary: 167 mg/dL — ABNORMAL HIGH (ref 65–99)
Glucose-Capillary: 330 mg/dL — ABNORMAL HIGH (ref 65–99)

## 2015-06-11 LAB — BASIC METABOLIC PANEL
Anion gap: 11 (ref 5–15)
BUN: 25 mg/dL — ABNORMAL HIGH (ref 6–20)
CO2: 20 mmol/L — ABNORMAL LOW (ref 22–32)
Calcium: 9.2 mg/dL (ref 8.9–10.3)
Chloride: 109 mmol/L (ref 101–111)
Creatinine, Ser: 1.87 mg/dL — ABNORMAL HIGH (ref 0.44–1.00)
GFR calc Af Amer: 35 mL/min — ABNORMAL LOW (ref >60)
GFR, EST NON AFRICAN AMERICAN: 30 mL/min — AB (ref >60)
Glucose, Bld: 250 mg/dL — ABNORMAL HIGH (ref 65–99)
POTASSIUM: 3.1 mmol/L — AB (ref 3.5–5.1)
SODIUM: 140 mmol/L (ref 135–145)

## 2015-06-11 LAB — CBC
HCT: 30.9 % — ABNORMAL LOW (ref 36.0–46.0)
Hemoglobin: 10.7 g/dL — ABNORMAL LOW (ref 12.0–15.0)
MCH: 30.7 pg (ref 26.0–34.0)
MCHC: 34.6 g/dL (ref 30.0–36.0)
MCV: 88.5 fL (ref 78.0–100.0)
Platelets: 176 10*3/uL (ref 150–400)
RBC: 3.49 MIL/uL — AB (ref 3.87–5.11)
RDW: 13.7 % (ref 11.5–15.5)
WBC: 8.3 10*3/uL (ref 4.0–10.5)

## 2015-06-11 LAB — URINE CULTURE: Colony Count: 25000

## 2015-06-11 MED ORDER — PROMETHAZINE HCL 25 MG/ML IJ SOLN
12.5000 mg | Freq: Once | INTRAMUSCULAR | Status: AC
  Administered 2015-06-11: 12.5 mg via INTRAVENOUS
  Filled 2015-06-11: qty 1

## 2015-06-11 MED ORDER — SODIUM CHLORIDE 0.9 % IV SOLN
INTRAVENOUS | Status: DC
  Administered 2015-06-11 – 2015-06-12 (×2): via INTRAVENOUS

## 2015-06-11 MED ORDER — LORAZEPAM 2 MG/ML PO CONC: 0.5000 mg | Freq: Three times a day (TID) | ORAL | Status: DC | PRN

## 2015-06-11 MED ORDER — METOPROLOL TARTRATE 1 MG/ML IV SOLN
2.5000 mg | Freq: Four times a day (QID) | INTRAVENOUS | Status: DC
  Administered 2015-06-11 – 2015-06-13 (×8): 2.5 mg via INTRAVENOUS
  Filled 2015-06-11 (×8): qty 5

## 2015-06-11 MED ORDER — CARVEDILOL 25 MG PO TABS: 25.0000 mg | ORAL_TABLET | Freq: Two times a day (BID) | ORAL | Status: DC

## 2015-06-11 MED ORDER — LORAZEPAM 2 MG/ML IJ SOLN
0.5000 mg | Freq: Four times a day (QID) | INTRAMUSCULAR | Status: DC
Start: 2015-06-11 — End: 2015-06-13
  Administered 2015-06-11 – 2015-06-13 (×8): 0.5 mg via INTRAVENOUS
  Filled 2015-06-11 (×8): qty 1

## 2015-06-11 MED ORDER — LORAZEPAM 0.5 MG PO TABS
0.5000 mg | ORAL_TABLET | Freq: Once | ORAL | Status: AC
  Administered 2015-06-11: 0.5 mg via ORAL

## 2015-06-11 MED ORDER — POTASSIUM CHLORIDE 10 MEQ/100ML IV SOLN
10.0000 meq | INTRAVENOUS | Status: AC
  Administered 2015-06-11 (×3): 10 meq via INTRAVENOUS
  Filled 2015-06-11 (×2): qty 100

## 2015-06-11 MED ORDER — LISINOPRIL 40 MG PO TABS: 40.0000 mg | ORAL_TABLET | Freq: Every day | ORAL | Status: DC

## 2015-06-11 MED ORDER — HYDROMORPHONE HCL 1 MG/ML IJ SOLN
0.5000 mg | INTRAMUSCULAR | Status: DC | PRN
Start: 2015-06-11 — End: 2015-06-13
  Administered 2015-06-11 – 2015-06-12 (×6): 0.5 mg via INTRAVENOUS
  Filled 2015-06-11 (×6): qty 1

## 2015-06-11 MED ORDER — PROMETHAZINE HCL 25 MG/ML IJ SOLN
12.5000 mg | Freq: Four times a day (QID) | INTRAMUSCULAR | Status: DC | PRN
  Administered 2015-06-12 (×2): 12.5 mg via INTRAVENOUS
  Filled 2015-06-11 (×2): qty 1

## 2015-06-11 MED ORDER — GI COCKTAIL ~~LOC~~
30.0000 mL | Freq: Once | ORAL | Status: AC
  Administered 2015-06-11: 30 mL via ORAL
  Filled 2015-06-11: qty 30

## 2015-06-11 MED ORDER — HYDRALAZINE HCL 10 MG PO TABS: 10.0000 mg | ORAL_TABLET | Freq: Three times a day (TID) | ORAL | Status: DC

## 2015-06-11 MED ORDER — SUMATRIPTAN 20 MG/ACT NA SOLN
20.0000 mg | Freq: Once | NASAL | Status: DC
Start: 2015-06-11 — End: 2015-06-13
  Filled 2015-06-11: qty 1

## 2015-06-11 MED ORDER — INSULIN ASPART 100 UNIT/ML ~~LOC~~ SOLN
0.0000 [IU] | Freq: Three times a day (TID) | SUBCUTANEOUS | Status: DC
  Administered 2015-06-12: 1 [IU] via SUBCUTANEOUS

## 2015-06-11 NOTE — Progress Notes (Signed)
Initial Nutrition Assessment  DOCUMENTATION CODES:  Not applicable  INTERVENTION:  Other (Comment) (RD to follow for diet advancement, supplement diet as appropriate)  NUTRITION DIAGNOSIS:  Inadequate oral intake related to nausea, vomiting as evidenced by NPO status.   GOAL:  Patient will meet greater than or equal to 90% of their needs   MONITOR:  PO intake, Supplement acceptance, Diet advancement, Labs, Weight trends, Skin, I & O's  REASON FOR ASSESSMENT:  Malnutrition Screening Tool    ASSESSMENT: [name] [name] is a 49 y.o. female presenting with nausea and vomiting . PMH is significant for cyclic vomiting syndrome, diabetes mellitus type 2, anxiety, acute pancreatitis, HTN, CKD.  Pt admitted with cyclic vomiting, likely due to marijuana use, per MD notes.   Pt asleep at time of visit. Staff report that pt was on clear liquid diet this morning, but pt was reverted back to NPO status, due to vomiting after eating. Meal tray at bedside revealed pt did not consume juice, broth, or jello, but consumed about 50% of Resource Breeze drink.   Wt hx reveals wt stability. UBW between 150-160#.   Nutrition-Focused physical exam completed. Findings are no fat depletion, no muscle depletion, and no edema.   Labs reviewed: K: 3.1 (on supplement), CBGS: 73-274. Hgb A1c: 7.4.  Height:  Ht Readings from Last 1 Encounters:  06/09/15 5\' 1"  (1.549 m)    Weight:  Wt Readings from Last 1 Encounters:  06/09/15 154 lb (69.854 kg)    Ideal Body Weight:  47.7 kg  Wt Readings from Last 10 Encounters:  06/09/15 154 lb (69.854 kg)  06/02/15 152 lb 3 oz (69.032 kg)  05/08/15 154 lb (69.854 kg)  04/30/15 154 lb (69.854 kg)  04/24/15 154 lb 3.2 oz (69.945 kg)  03/25/15 153 lb (69.4 kg)  03/03/15 159 lb (72.122 kg)  02/19/15 162 lb (73.483 kg)  02/13/15 160 lb (72.576 kg)  02/02/15 160 lb 11.5 oz (72.9 kg)    BMI:  Body mass index is 29.11 kg/(m^2).  Estimated Nutritional  Needs:  Kcal:  1700-1900  Protein:  75-85 grams  Fluid:  1.7-1.9 L  Skin:  Reviewed, no issues  Diet Order:  Diet NPO time specified Except for: Sips with Meds  EDUCATION NEEDS:  No education needs identified at this time  No intake or output data in the 24 hours ending 06/11/15 1053  Last BM:  06/11/15  Lavance Beazer A. Jimmye Norman, RD, LDN, CDE Pager: [phone] After hours Pager: [phone]

## 2015-06-11 NOTE — Progress Notes (Signed)
Family Medicine Teaching Service Daily Progress Note Intern Pager: [phone]  Patient name: [name] [name] Medical record number: [id number] Date of birth: 1966-10-26 Age: 49 y.o. Gender: female  Primary Care Provider: Conni Slipper, MD Consultants: None  Code Status: Full  Pt Overview and Major Events to Date:  6/13: N/V.   Assessment and Plan: [name] [name] is a 49 y.o. female presenting with nausea and vomiting . PMH is significant for cyclic vomiting syndrome, diabetes mellitus type 2, anxiety, acute pancreatitis, HTN, CKD.  Nausea and vomiting: similar to previous episodes. Likely cyclic vomiting (she refuses to believe marijuana contributes to symptoms and smoked a couple days ago). Patient currently stable. S/p 2L bolus in ED. EKG unchanged from previous. QTc nml. CT abd with bladder wall thickening, mild persistent R hydronephrosis and proximal R ureteral dilation with b/l perinephric stranding ? UTI vs from recent UTI. UA with small hgb, 30 protein. No bacteria, leuks or nitrites. Not endorsing urinary sx's. Normal LA, LFTs, and poc trop. Patient very frustrated that her Ativan has been decreased in the outpatient setting as she feels this works the best.  -VS per floor protocol -cardiac monitoring - patient with worsening vomiting overnight. -Ativan 0.5mg  PO TID PRN> per patient request this AM, switched to liquid form  -Phenergan 12.5mg  PO q8hrs PRN > transitioned to IV - Consider sumatriptan intranasallly for abortive treatment -Given worsening vomiting on clears, will make NPO again - Will start back NS @ 125cc/hr -Gastroccult testing positive given report of dark emesis by nurse though on review of it, not coffee ground or grossly bloody. Most likely Mallory Weis tear.  -PPI IV -Urine culture with 25,000 colonies of multiple bacterial morphotypes  Hypokalemia: Most likely in the setting of vomiting as bicarb is also slightly low.  - K 3.1 this AM   - Given  patient refusing to take meds by mouth, will given KCl 5mEq x 3 runs - Continue to monitor   Concerns for community Acquired pneumonia: Febrile to 102.28F in ED. No leukocytosis. Breathing well on RA. CT with minimal patchy b/l infiltrates. ?early pna. Currently not endorsing SOB, cough. CXR without  acute cardiopulmonary disease.  -s/p Rocephin and Zithromax IV in ED -  No antibiotics for now   -monitor fever curve; blood culture x 2 NGTD  Diabetes mellitus, type 2: Blood sugar elevated to 175. Last A1C of 9.4 on 04/04/2014. On Lantus at home, however has not been on it since here. Patient with hypoglycemic episode evening of 6/14: was 218 and received 3u SSI and on next check was found to be 19 (recheck 22). Received 1 amp D50 with 100s, however 15 minutes later dropped to 78 therefore gave 1 more amp D50. It is unusual that her CBG would drop so significantly with only the 3u SSI recorded. Will continue to monitor -continue to hold home lantus  -Sensitive sliding scale -Monitor CBGs  Hypertension: BP 202/91 on admission most likely due to vomiting. On Coreg 25 BID, Hydralazine 10mg  TID, Lisinopril 40mg  qd   - BPs elevated, most likely from vomiting - Metop 2.5mg  IV q6hrs  -Hydralazine 5mg  q4 PRN SBP>180, DBP>100.  - When taking PO again, attempt to restart home regimen   Anemia: baseline of around 10-12. 11.3 on admission. -Monitor CBC: stable currently   Constipation -Colace, Miralax  CKD: Baseline creatinine of around 2. Creatinine on admission of 2.20 - SCr 1.87  - continue to follow   FEN/GI: NPO, NS @ 125cc/hr PPI Prophylaxis: heparin  subq   Disposition: Pending improvement in symptoms  Subjective:  Patient angry. States she's been in pain since 3pm. Had vomiting that started again last night. Notes she vomited her Ativan and Phenergan. Overnight MD gave another 1 time does of Ativan. Patient states she vomited this as well. Also endorsing epigastric pain that is  10/10 in nature. Was given GI cocktail last night which she vomited. She also developed diarrhea this AM.   Objective: Temp:  [98.1 F (36.7 C)-98.3 F (36.8 C)] 98.3 F (36.8 C) (06/14 2213) Pulse Rate:  [72-76] 76 (06/14 2213) Resp:  [18] 18 (06/14 2213) BP: (122-150)/(55-72) 122/55 mmHg (06/14 2213) SpO2:  [100 %] 100 % (06/14 2213) Physical Exam: General: Lying in bed, appeared to be sleeping prone when I walked in, then began to rock back and forth. Throughout interview patient writhing in pain, kicking her legs again.  Cardiovascular: RRR, no m/r/g noted. No pitting edema Respiratory: CTAB without wheezing, rhonchi, or crackles noted. No increased WOB Abdomen: +BS, soft, nondistended. TTP diffusely, most but prominent in epigastric region. No guarding or rebound  Extremities: No tenderness.   Laboratory:  Recent Labs Lab 06/09/15 1013 06/10/15 0420 06/11/15 0459  WBC 10.1 10.2 8.3  HGB 11.3* 10.5* 10.7*  HCT 32.8* 30.7* 30.9*  PLT 187 168 176    Recent Labs Lab 06/09/15 1013 06/10/15 0420 06/11/15 0459  NA 141 141 140  K 4.4 3.8 3.1*  CL 111 109 109  CO2 23 19* 20*  BUN 33* 32* 25*  CREATININE 2.20* 1.94* 1.87*  CALCIUM 9.8 9.0 9.2  PROT 7.6  --   --   BILITOT 1.0  --   --   ALKPHOS 86  --   --   ALT 11*  --   --   AST 17  --   --   GLUCOSE 175* 272* 250*  Lactic acid 0.93  FOBT (gastro-occult) positive U/A: Protein 30, 3-6RBC, few squams, neg LE and nitrite  Urine culture: 25,000 colonies of multiple bacterial morphotypes Blood cultures: NGTD   Imaging/Diagnostic Tests: 06/09/2015 CLINICAL DATA: Upper abdominal pain, nausea, and vomiting; history hypertension, pancreatitis, MI, GERD, type II diabetes with gastroparesis, smoker EXAM: CT ABDOMEN AND PELVIS WITHOUT CONTRAST TECHNIQUE: Multidetector CT imaging of the abdomen and pelvis was performed following the standard protocol without IV contrast. Sagittal and coronal MPR images reconstructed from  axial data set. Oral contrast was not administered. COMPARISON: 01/31/2015 FINDINGS: Minimal patchy infiltrate in both lower lobes, new. Gallbladder and uterus surgically absent with nonvisualization of ovaries. Mild dilatation of the RIGHT renal collecting system and proximal RIGHT ureter without definite calcification or point of obstruction. Mild BILATERAL perinephric stranding. Bladder wall thickening slightly increased versus previous study though bladder is less distended. Liver, spleen, pancreas, and adrenal glands unremarkable for technique. Specifically note definite features of acute pancreatitis identified by noncontrast CT Increased stool in colon. Stomach and small bowel loops grossly unremarkable for technique. Normal appendix. No mass, adenopathy, free fluid or free air. No acute osseous findings. IMPRESSION: Bladder wall thickening question due to infection, infiltrative process or incomplete distention. Persistent mild RIGHT hydronephrosis and proximal RIGHT ureteral dilatation with BILATERAL perinephric stranding ; these changes could be related to urinary tract infection, and correlation with urinalysis is recommended. Slightly increased stool in proximal colon. New minimal patchy bibasilar pulmonary infiltrates concerning for pneumonia. Electronically Signed By: Lavonia Dana M.D. On: 06/09/2015 16:27   Archie Patten, MD 06/11/2015, 6:45 AM PGY-1, [place]  [place] Intern pager: [phone], text pages welcome

## 2015-06-12 DIAGNOSIS — I10 Essential (primary) hypertension: Secondary | ICD-10-CM

## 2015-06-12 LAB — CBC
HEMATOCRIT: 27.9 % — AB (ref 36.0–46.0)
Hemoglobin: 9.5 g/dL — ABNORMAL LOW (ref 12.0–15.0)
MCH: 30.2 pg (ref 26.0–34.0)
MCHC: 34.1 g/dL (ref 30.0–36.0)
MCV: 88.6 fL (ref 78.0–100.0)
Platelets: 170 10*3/uL (ref 150–400)
RBC: 3.15 MIL/uL — ABNORMAL LOW (ref 3.87–5.11)
RDW: 13.7 % (ref 11.5–15.5)
WBC: 6.2 10*3/uL (ref 4.0–10.5)

## 2015-06-12 LAB — BASIC METABOLIC PANEL
ANION GAP: 9 (ref 5–15)
BUN: 20 mg/dL (ref 6–20)
CALCIUM: 8.5 mg/dL — AB (ref 8.9–10.3)
CO2: 23 mmol/L (ref 22–32)
Chloride: 107 mmol/L (ref 101–111)
Creatinine, Ser: 1.76 mg/dL — ABNORMAL HIGH (ref 0.44–1.00)
GFR calc Af Amer: 38 mL/min — ABNORMAL LOW (ref >60)
GFR calc non Af Amer: 33 mL/min — ABNORMAL LOW (ref >60)
GLUCOSE: 273 mg/dL — AB (ref 65–99)
Potassium: 3.7 mmol/L (ref 3.5–5.1)
SODIUM: 139 mmol/L (ref 135–145)

## 2015-06-12 LAB — GLUCOSE, CAPILLARY
GLUCOSE-CAPILLARY: 238 mg/dL — AB (ref 65–99)
GLUCOSE-CAPILLARY: 132 mg/dL — AB (ref 65–99)
GLUCOSE-CAPILLARY: 202 mg/dL — AB (ref 65–99)

## 2015-06-12 MED ORDER — INSULIN ASPART 100 UNIT/ML ~~LOC~~ SOLN
0.0000 [IU] | Freq: Three times a day (TID) | SUBCUTANEOUS | Status: DC
  Administered 2015-06-12: 1 [IU] via SUBCUTANEOUS
  Administered 2015-06-12: 3 [IU] via SUBCUTANEOUS
  Administered 2015-06-13: 2 [IU] via SUBCUTANEOUS

## 2015-06-12 NOTE — Progress Notes (Signed)
Family Medicine Teaching Service Daily Progress Note Intern Pager: [phone]  Patient name: [name] [name] Medical record number: [id number] Date of birth: 11-17-1966 Age: 49 y.o. Gender: female  Primary Care Provider: Conni Slipper, MD Consultants: None  Code Status: Full  Pt Overview and Major Events to Date:  6/13: N/V.   Assessment and Plan: [name] [name] is a 49 y.o. female presenting with nausea and vomiting . PMH is significant for cyclic vomiting syndrome, diabetes mellitus type 2, anxiety, acute pancreatitis, HTN, CKD.  Nausea and vomiting: similar to previous episodes. Likely cyclic vomiting (she refuses to believe marijuana contributes to symptoms and smoked a couple days ago). Patient currently stable. S/p 2L bolus in ED. EKG unchanged from previous. QTc nml. CT abd with bladder wall thickening, mild persistent R hydronephrosis and proximal R ureteral dilation with b/l perinephric stranding ? UTI vs from recent UTI. UA with small hgb, 30 protein. No bacteria, leuks or nitrites. Not endorsing urinary sx's. Normal LA, LFTs, and poc trop. .  -VS per floor protocol -cardiac monitoring -Ativan 0.5mg  IV TID PRN  -Phenergan 12.5mg  IV q8hrs PRN - Discussed sumatriptan intranasallly for abortive treatment: patient initially amenable, however per RN, she changed her mind. When I asked pt about this, she states she just forgot.  -Consider clears, if tolerating will discontinue dilaudid and change other meds to PO, otherwise will go back to NPO - NS @ 125cc/hr -Gastroccult testing positive given report of dark emesis by nurse though on review of it, not coffee ground or grossly bloody. Most likely Mallory Weis tear.  -PPI IV -Urine culture with 25,000 colonies of multiple bacterial morphotypes  Hypokalemia: Most likely in the setting of vomiting as bicarb is also slightly low.  - K 3.7 this AM after KCl 80mEq x 3 runs - Continue to monitor   Concerns for community Acquired  pneumonia: Febrile to 102.40F in ED. No leukocytosis. Breathing well on RA. CT with minimal patchy b/l infiltrates. ?early pna. Currently not endorsing SOB, cough. CXR without  acute cardiopulmonary disease.  -s/p Rocephin and Zithromax IV in ED -  No antibiotics for now   -monitor fever curve; blood culture x 2 NGTD  Diabetes mellitus, type 2: Blood sugar elevated to 175. Last A1C of 9.4 on 04/04/2014. On Lantus at home, however has not been on it since here. Patient with hypoglycemic episode evening of 6/14. -decreased sensitive sliding scale due to hypoglycemia -Monitor CBGs: 330-191  Hypertension: BP 202/91 on admission most likely due to vomiting. On Coreg 25 BID, Hydralazine 10mg  TID, Lisinopril 40mg  qd   - BPs elevated, most likely from vomiting - Metop 2.5mg  IV q6hrs  -Hydralazine 5mg  q4 PRN SBP>180, DBP>100.  - When taking PO again, attempt to restart home regimen   Anemia: baseline of around 10-12. 11.3 on admission. -Monitor CBC: stable currently   Constipation -Colace, Miralax  CKD: Baseline creatinine of around 2. Creatinine on admission of 2.20 - SCr 1.76  - continue to follow   FEN/GI: Clears NS @ 125cc/hr PPI Prophylaxis: heparin subq   Disposition: Pending improvement in symptoms  Subjective:  Patient without vomiting since yesterday. Feeling much better with IV meds. Had a Breeze nutrition supplement yesterday without issue. Asking for water today. When asked how the sumatriptan worked, patient states she "forgot about this."  Understandably reluctant to try it now as she's feeling much better. Still having some epigastric pain.   Objective: Temp:  [98.6 F (37 C)-99 F (37.2 C)] 99 F (37.2  C) (06/16 0502) Pulse Rate:  [78-100] 82 (06/16 0502) Resp:  [18-19] 18 (06/16 0502) BP: (146-195)/(53-97) 159/53 mmHg (06/16 0502) SpO2:  [100 %] 100 % (06/16 0502) Physical Exam: General: Lying in bed, sleeping on side, comfortable.  Cardiovascular: RRR, no  m/r/g noted. No pitting edema in the LEs Respiratory: CTAB without wheezing, rhonchi, or crackles noted. No increased WOB Abdomen: +BS, soft, nondistended. TTP diffusely, most prominently in epigastric region. No guarding or rebound  Extremities: No tenderness.   Laboratory:  Recent Labs Lab 06/09/15 1013 06/10/15 0420 06/11/15 0459  WBC 10.1 10.2 8.3  HGB 11.3* 10.5* 10.7*  HCT 32.8* 30.7* 30.9*  PLT 187 168 176    Recent Labs Lab 06/09/15 1013 06/10/15 0420 06/11/15 0459  NA 141 141 140  K 4.4 3.8 3.1*  CL 111 109 109  CO2 23 19* 20*  BUN 33* 32* 25*  CREATININE 2.20* 1.94* 1.87*  CALCIUM 9.8 9.0 9.2  PROT 7.6  --   --   BILITOT 1.0  --   --   ALKPHOS 86  --   --   ALT 11*  --   --   AST 17  --   --   GLUCOSE 175* 272* 250*  Lactic acid 0.93  FOBT (gastro-occult) positive U/A: Protein 30, 3-6RBC, few squams, neg LE and nitrite  Urine culture: 25,000 colonies of multiple bacterial morphotypes Blood cultures: NGTD   Imaging/Diagnostic Tests: 06/09/2015 CLINICAL DATA: Upper abdominal pain, nausea, and vomiting; history hypertension, pancreatitis, MI, GERD, type II diabetes with gastroparesis, smoker EXAM: CT ABDOMEN AND PELVIS WITHOUT CONTRAST TECHNIQUE: Multidetector CT imaging of the abdomen and pelvis was performed following the standard protocol without IV contrast. Sagittal and coronal MPR images reconstructed from axial data set. Oral contrast was not administered. COMPARISON: 01/31/2015 FINDINGS: Minimal patchy infiltrate in both lower lobes, new. Gallbladder and uterus surgically absent with nonvisualization of ovaries. Mild dilatation of the RIGHT renal collecting system and proximal RIGHT ureter without definite calcification or point of obstruction. Mild BILATERAL perinephric stranding. Bladder wall thickening slightly increased versus previous study though bladder is less distended. Liver, spleen, pancreas, and adrenal glands unremarkable for  technique. Specifically note definite features of acute pancreatitis identified by noncontrast CT Increased stool in colon. Stomach and small bowel loops grossly unremarkable for technique. Normal appendix. No mass, adenopathy, free fluid or free air. No acute osseous findings. IMPRESSION: Bladder wall thickening question due to infection, infiltrative process or incomplete distention. Persistent mild RIGHT hydronephrosis and proximal RIGHT ureteral dilatation with BILATERAL perinephric stranding ; these changes could be related to urinary tract infection, and correlation with urinalysis is recommended. Slightly increased stool in proximal colon. New minimal patchy bibasilar pulmonary infiltrates concerning for pneumonia. Electronically Signed By: Lavonia Dana M.D. On: 06/09/2015 16:27   Archie Patten, MD 06/12/2015, 6:45 AM PGY-1, [place] Intern pager: [phone], text pages welcome

## 2015-06-12 NOTE — Clinical Social Work Note (Signed)
CSW consulted regarding homelessness concerns. CSW spoke with patient regarding living situation outside of hospital. Per patient, patient currently in transition of moving back to [place] from [place]. Patient informed CSW patient will be staying with patient's cousin at time of discharge. Per patient, patient's cousin aware of patient's discharge plan. No other needs identified at this time.  CSW signing off.  Lubertha Sayres, [place] Clinical Social Work Department Orthopedics [phone]) and Surgical [phone])

## 2015-06-13 DIAGNOSIS — Z720 Tobacco use: Secondary | ICD-10-CM

## 2015-06-13 LAB — GLUCOSE, CAPILLARY: GLUCOSE-CAPILLARY: 163 mg/dL — AB (ref 65–99)

## 2015-06-13 LAB — BASIC METABOLIC PANEL
Anion gap: 5 (ref 5–15)
BUN: 12 mg/dL (ref 6–20)
CALCIUM: 8.3 mg/dL — AB (ref 8.9–10.3)
CO2: 24 mmol/L (ref 22–32)
CREATININE: 1.52 mg/dL — AB (ref 0.44–1.00)
Chloride: 111 mmol/L (ref 101–111)
GFR calc Af Amer: 45 mL/min — ABNORMAL LOW (ref >60)
GFR calc non Af Amer: 39 mL/min — ABNORMAL LOW (ref >60)
Glucose, Bld: 160 mg/dL — ABNORMAL HIGH (ref 65–99)
Potassium: 4 mmol/L (ref 3.5–5.1)
Sodium: 140 mmol/L (ref 135–145)

## 2015-06-13 LAB — CBC
HEMATOCRIT: 26.1 % — AB (ref 36.0–46.0)
HEMOGLOBIN: 9 g/dL — AB (ref 12.0–15.0)
MCH: 31 pg (ref 26.0–34.0)
MCHC: 34.5 g/dL (ref 30.0–36.0)
MCV: 90 fL (ref 78.0–100.0)
Platelets: 163 10*3/uL (ref 150–400)
RBC: 2.9 MIL/uL — ABNORMAL LOW (ref 3.87–5.11)
RDW: 13.7 % (ref 11.5–15.5)
WBC: 5.3 10*3/uL (ref 4.0–10.5)

## 2015-06-13 MED ORDER — HEPARIN SOD (PORK) LOCK FLUSH 100 UNIT/ML IV SOLN
500.0000 [IU] | INTRAVENOUS | Status: AC | PRN
  Administered 2015-06-13: 500 [IU]

## 2015-06-13 NOTE — Progress Notes (Signed)
Discharge paperwork given to patient. No questions verbalized. PAC de-accessed by IV team. Patient ready for discharge.

## 2015-06-13 NOTE — Discharge Instructions (Signed)
Please stop smoking marijuana, it is not good for your vomiting flares. Please keep your outpatient appointment. At your last outpatient visit, Dr. Darene Lamer wrote your a prescription for Ativan with a "do not fill until 6/14" note on it so you should have enough to last you. You blood sugars have been low while you've been here. Please only use Lantus 10 units daily and increase to your normal regimen depending on how your blood sugars at home look.   Cyclic Vomiting Syndrome Cyclic vomiting syndrome is a benign condition in which patients experience bouts or cycles of severe nausea and vomiting that last for hours or even days. The bouts of nausea and vomiting alternate with longer periods of no symptoms and generally good health. Cyclic vomiting syndrome occurs mostly in children, but can affect adults. CAUSES  [facility] has no known cause. Each episode is typically similar to the previous ones. The episodes tend to:   Start at about the same time of day.  Last the same length of time.  Present the same symptoms at the same level of intensity. Cyclic vomiting syndrome can begin at any age in children and adults. Cyclic vomiting syndrome usually starts between the ages of 3 and 7 years. In adults, episodes tend to occur less often than they do in children, but they last longer. Furthermore, the events or situations that trigger episodes in adults cannot always be pinpointed as easily as they can in children. There are 4 phases of cyclic vomiting syndrome: 1. Prodrome. The prodrome phase signals that an episode of nausea and vomiting is about to begin. This phase can last from just a few minutes to several hours. This phase is often marked by belly (abdominal) pain. Sometimes taking medicine early in the prodrome phase can stop an episode in progress. However, sometimes there is no warning. A person may simply wake up in the middle of the night or early morning and begin vomiting. 2. Episode. The episode phase  consists of:  Severe vomiting.  Nausea.  Gagging (retching). 3. Recovery. The recovery phase begins when the nausea and vomiting stop. Healthy color, appetite, and energy return. 4. Symptom-free interval. The symptom-free interval phase is the period between episodes when no symptoms are present. TRIGGERS Episodes can be triggered by an infection or event. Examples of triggers include:  Infections.  Colds, allergies, sinus problems, and the flu.  Eating certain foods such as chocolate or cheese.  Foods with monosodium glutamate (MSG) or preservatives.  Fast foods.  Pre-packaged foods.  Foods with low nutritional value (junk foods).  Overeating.  Eating just before going to bed.  Hot weather.  Dehydration.  Not enough sleep or poor sleep quality.  Physical exhaustion.  Menstruation.  Motion sickness.  Emotional stress (school or home difficulties).  Excitement or stress. SYMPTOMS  The main symptoms of cyclic vomiting syndrome are:  Severe vomiting.  Nausea.  Gagging (retching). Episodes usually begin at night or the first thing in the morning. Episodes may include vomiting or retching up to 5 or 6 times an hour during the worst of the episode. Episodes usually last anywhere from 1 to 4 days. Episodes can last for up to 10 days. Other symptoms include:  Paleness.  Exhaustion.  Listlessness.  Abdominal pain.  Loose stools or diarrhea. Sometimes the nausea and vomiting are so severe that a person appears to be almost unconscious. Sensitivity to light, headache, fever, dizziness, may also accompany an episode. In addition, the vomiting may cause drooling and  excessive thirst. Drinking water usually leads to more vomiting, though the water can dilute the acid in the vomit, making the episode a little less painful. Continuous vomiting can lead to dehydration, which means that the body has lost excessive water and salts. DIAGNOSIS  Cyclic vomiting syndrome is  hard to diagnose because there are no clear tests to identify it. A caregiver must diagnose cyclic vomiting syndrome by looking at symptoms and medical history. A caregiver must exclude more common diseases or disorders that can also cause nausea and vomiting. Also, diagnosis takes time because caregivers need to identify a pattern or cycle to the vomiting. TREATMENT  Cyclic vomiting syndrome cannot be cured. Treatment varies, but people with cyclic vomiting syndrome should get plenty of rest and sleep and take medications that prevent, stop, or lessen the vomiting episodes and other symptoms. People whose episodes are frequent and long-lasting may be treated during the symptom-free intervals in an effort to prevent or ease future episodes. The symptom-free phase is a good time to eliminate anything known to trigger an episode. For example, if episodes are brought on by stress or excitement, this period is the time to find ways to reduce stress and stay calm. If sinus problems or allergies cause episodes, those conditions should be treated. The triggers listed above should be avoided or prevented. Because of the similarities between migraine and cyclic vomiting syndrome, caregivers treat some people with severe cyclic vomiting syndrome with drugs that are also used for migraine headaches. The drugs are designed to:  Prevent episodes.  Reduce their frequency.  Lessen their severity. HOME CARE INSTRUCTIONS Once a vomiting episode begins, treatment is supportive. It helps to stay in bed and sleep in a dark, quiet room. Severe nausea and vomiting may require hospitalization and intravenous (IV) fluids to prevent dehydration. Relaxing medications (sedatives) may help if the nausea continues. Sometimes, during the prodrome phase, it is possible to stop an episode from happening altogether. Only take over-the-counter or prescription medicines for pain, discomfort or fever as directed by your caregiver. Do not  give aspirin to children. During the recovery phase, drinking water and replacing lost electrolytes (salts in the blood) are very important. Electrolytes are salts that the body needs to function well and stay healthy. Symptoms during the recovery phase can vary. Some people find that their appetites return to normal immediately, while others need to begin by drinking clear liquids and then move slowly to solid food. RELATED COMPLICATIONS The severe vomiting that defines cyclic vomiting syndrome is a risk factor for several complications:  Dehydration--Vomiting causes the body to lose water quickly.  Electrolyte imbalance--Vomiting also causes the body to lose the important salts it needs to keep working properly.  Peptic esophagitis--The tube that connects the mouth to the stomach (esophagus) becomes injured from the stomach acid that comes up with the vomit.  Hematemesis--The esophagus becomes irritated and bleeds, so blood mixes with the vomit.  Mallory-Weiss tear--The lower end of the esophagus may tear open or the stomach may bruise from vomiting or retching.  Tooth decay--The acid in the vomit can hurt the teeth by corroding the tooth enamel. SEEK MEDICAL CARE IF: You have questions or problems. Document Released: 02/21/2002 Document Revised: 03/06/2012 Document Reviewed: 03/22/2011 [facility] Patient Information 2015 [place], [place]. This information is not intended to replace advice given to you by your health care provider. Make sure you discuss any questions you have with your health care provider.

## 2015-06-16 ENCOUNTER — Encounter: Payer: Self-pay | Admitting: Family Medicine

## 2015-06-16 LAB — CULTURE, BLOOD (ROUTINE X 2)
CULTURE: NO GROWTH
Culture: NO GROWTH

## 2015-06-20 ENCOUNTER — Ambulatory Visit (INDEPENDENT_AMBULATORY_CARE_PROVIDER_SITE_OTHER): Payer: Medicare Other | Admitting: Family Medicine

## 2015-06-20 ENCOUNTER — Telehealth: Payer: Self-pay | Admitting: Family Medicine

## 2015-06-20 ENCOUNTER — Encounter: Payer: Self-pay | Admitting: Family Medicine

## 2015-06-20 VITALS — BP 145/63 | HR 62 | Temp 98.1°F | Wt 152.4 lb

## 2015-06-20 DIAGNOSIS — G43A1 Cyclical vomiting, intractable: Secondary | ICD-10-CM

## 2015-06-20 DIAGNOSIS — N183 Chronic kidney disease, stage 3 (moderate): Secondary | ICD-10-CM

## 2015-06-20 DIAGNOSIS — E1329 Other specified diabetes mellitus with other diabetic kidney complication: Secondary | ICD-10-CM | POA: Diagnosis not present

## 2015-06-20 DIAGNOSIS — L299 Pruritus, unspecified: Secondary | ICD-10-CM

## 2015-06-20 DIAGNOSIS — R1115 Cyclical vomiting syndrome unrelated to migraine: Secondary | ICD-10-CM

## 2015-06-20 DIAGNOSIS — E1365 Other specified diabetes mellitus with hyperglycemia: Secondary | ICD-10-CM

## 2015-06-20 DIAGNOSIS — K921 Melena: Secondary | ICD-10-CM | POA: Diagnosis not present

## 2015-06-20 DIAGNOSIS — E1322 Other specified diabetes mellitus with diabetic chronic kidney disease: Secondary | ICD-10-CM

## 2015-06-20 DIAGNOSIS — IMO0002 Reserved for concepts with insufficient information to code with codable children: Secondary | ICD-10-CM

## 2015-06-20 MED ORDER — CETIRIZINE HCL 10 MG PO TABS: 10.0000 mg | ORAL_TABLET | Freq: Every day | ORAL | Status: DC

## 2015-06-20 NOTE — Telephone Encounter (Signed)
Patient is here and is requesting to speak to Dr. McDiarmid personally. She refused to give me any other information than this. Thank you, Fonda Kinder, ASA

## 2015-06-20 NOTE — Patient Instructions (Signed)
I have referred you to gastroenterology to discuss colonoscopy and evaluate your cyclic vomiting. Continue to work on quitting smoking and marijuana, one at a time, in whatever way you think you will be most successful. Try 1-800-QUIT-NOW. Try intranasal sumatriptan or propranolol for flares - let me know which you would like to trial. If you are unable to stay hydrated, seek immediate care. Otherwise, continue with warm liquids to see if this helps not only hydration but also nausea.  I have represcribed your cetirizine to try for itching. It helps with allergies.  Best,  Hilton Sinclair, MD  Cyclic Vomiting Syndrome Cyclic vomiting syndrome is a benign condition in which patients experience bouts or cycles of severe nausea and vomiting that last for hours or even days. The bouts of nausea and vomiting alternate with longer periods of no symptoms and generally good health. Cyclic vomiting syndrome occurs mostly in children, but can affect adults. CAUSES  [facility] has no known cause. Each episode is typically similar to the previous ones. The episodes tend to:   Start at about the same time of day.  Last the same length of time.  Present the same symptoms at the same level of intensity. Cyclic vomiting syndrome can begin at any age in children and adults. Cyclic vomiting syndrome usually starts between the ages of 3 and 7 years. In adults, episodes tend to occur less often than they do in children, but they last longer. Furthermore, the events or situations that trigger episodes in adults cannot always be pinpointed as easily as they can in children. There are 4 phases of cyclic vomiting syndrome: 1. Prodrome. The prodrome phase signals that an episode of nausea and vomiting is about to begin. This phase can last from just a few minutes to several hours. This phase is often marked by belly (abdominal) pain. Sometimes taking medicine early in the prodrome phase can stop an episode in progress.  However, sometimes there is no warning. A person may simply wake up in the middle of the night or early morning and begin vomiting. 2. Episode. The episode phase consists of:  Severe vomiting.  Nausea.  Gagging (retching). 3. Recovery. The recovery phase begins when the nausea and vomiting stop. Healthy color, appetite, and energy return. 4. Symptom-free interval. The symptom-free interval phase is the period between episodes when no symptoms are present. TRIGGERS Episodes can be triggered by an infection or event. Examples of triggers include:  Infections.  Colds, allergies, sinus problems, and the flu.  Eating certain foods such as chocolate or cheese.  Foods with monosodium glutamate (MSG) or preservatives.  Fast foods.  Pre-packaged foods.  Foods with low nutritional value (junk foods).  Overeating.  Eating just before going to bed.  Hot weather.  Dehydration.  Not enough sleep or poor sleep quality.  Physical exhaustion.  Menstruation.  Motion sickness.  Emotional stress (school or home difficulties).  Excitement or stress. SYMPTOMS  The main symptoms of cyclic vomiting syndrome are:  Severe vomiting.  Nausea.  Gagging (retching). Episodes usually begin at night or the first thing in the morning. Episodes may include vomiting or retching up to 5 or 6 times an hour during the worst of the episode. Episodes usually last anywhere from 1 to 4 days. Episodes can last for up to 10 days. Other symptoms include:  Paleness.  Exhaustion.  Listlessness.  Abdominal pain.  Loose stools or diarrhea. Sometimes the nausea and vomiting are so severe that a person appears to be  almost unconscious. Sensitivity to light, headache, fever, dizziness, may also accompany an episode. In addition, the vomiting may cause drooling and excessive thirst. Drinking water usually leads to more vomiting, though the water can dilute the acid in the vomit, making the episode a  little less painful. Continuous vomiting can lead to dehydration, which means that the body has lost excessive water and salts. DIAGNOSIS  Cyclic vomiting syndrome is hard to diagnose because there are no clear tests to identify it. A caregiver must diagnose cyclic vomiting syndrome by looking at symptoms and medical history. A caregiver must exclude more common diseases or disorders that can also cause nausea and vomiting. Also, diagnosis takes time because caregivers need to identify a pattern or cycle to the vomiting. TREATMENT  Cyclic vomiting syndrome cannot be cured. Treatment varies, but people with cyclic vomiting syndrome should get plenty of rest and sleep and take medications that prevent, stop, or lessen the vomiting episodes and other symptoms. People whose episodes are frequent and long-lasting may be treated during the symptom-free intervals in an effort to prevent or ease future episodes. The symptom-free phase is a good time to eliminate anything known to trigger an episode. For example, if episodes are brought on by stress or excitement, this period is the time to find ways to reduce stress and stay calm. If sinus problems or allergies cause episodes, those conditions should be treated. The triggers listed above should be avoided or prevented. Because of the similarities between migraine and cyclic vomiting syndrome, caregivers treat some people with severe cyclic vomiting syndrome with drugs that are also used for migraine headaches. The drugs are designed to:  Prevent episodes.  Reduce their frequency.  Lessen their severity. HOME CARE INSTRUCTIONS Once a vomiting episode begins, treatment is supportive. It helps to stay in bed and sleep in a dark, quiet room. Severe nausea and vomiting may require hospitalization and intravenous (IV) fluids to prevent dehydration. Relaxing medications (sedatives) may help if the nausea continues. Sometimes, during the prodrome phase, it is possible  to stop an episode from happening altogether. Only take over-the-counter or prescription medicines for pain, discomfort or fever as directed by your caregiver. Do not give aspirin to children. During the recovery phase, drinking water and replacing lost electrolytes (salts in the blood) are very important. Electrolytes are salts that the body needs to function well and stay healthy. Symptoms during the recovery phase can vary. Some people find that their appetites return to normal immediately, while others need to begin by drinking clear liquids and then move slowly to solid food. RELATED COMPLICATIONS The severe vomiting that defines cyclic vomiting syndrome is a risk factor for several complications:  Dehydration--Vomiting causes the body to lose water quickly.  Electrolyte imbalance--Vomiting also causes the body to lose the important salts it needs to keep working properly.  Peptic esophagitis--The tube that connects the mouth to the stomach (esophagus) becomes injured from the stomach acid that comes up with the vomit.  Hematemesis--The esophagus becomes irritated and bleeds, so blood mixes with the vomit.  Mallory-Weiss tear--The lower end of the esophagus may tear open or the stomach may bruise from vomiting or retching.  Tooth decay--The acid in the vomit can hurt the teeth by corroding the tooth enamel. SEEK MEDICAL CARE IF: You have questions or problems. Document Released: 02/21/2002 Document Revised: 03/06/2012 Document Reviewed: 03/22/2011 [facility] Patient Information 2015 [place], [place]. This information is not intended to replace advice given to you by your health care provider. Make  sure you discuss any questions you have with your health care provider.  

## 2015-06-20 NOTE — Progress Notes (Signed)
Patient ID: [name] [name], female   DOB: 1966/08/29, 49 y.o.   MRN: [id number] Subjective:   CC: Hospital follow up  HPI:   Cyclic vomiting syndrome Hospitalized [id number] for cyclic vomiting. CT abd/pelvis with no acute findings, but mild bilateral lower lobe pna with fever in ED. Tx with CTX and azithro x 1 but discontinued with neg CXR. Dark emesis - gastroccult positive. IVF and NPO, resolved and diet slowly advanced, Hgb remaining stable. Since then, she states that she has been still feeling very nauseated and vomited twice yesterday. Has not had any more emesis appearing like hematemesis but has had some blood-streaking in stool. Continues smoking cigarettes/marijuana. ABdominal pain has improved. States she does not need refills on ativan or oxycodone at this time. Took phenergan earlier in the week which helped with some nausea. She is frustrated with chronic symptoms. Has been able to eat.   Complaint States she was treated poorly in the hospital and wants to discuss with Dr McDiarmid or Andria Frames. Opens up to me some about this but still would like to speak with them mostly, not giving me more detail than that she was treated poorly by her doctor, stating her pain did not warrant opioid pain medication and she was accused of faking her pain.  Hypoglycemia Spell of hypoglycemia to 19. Discharged on lantus 10 units and asked to increase PRN home CBGs. Denies further issues with hypoglycemia and has been able to tolerate PO.   Review of Systems - Per HPI.  Smoking status: Continues tmoking    Objective:  Physical Exam BP 145/63 mmHg  Pulse 62  Temp(Src) 98.1 F (36.7 C) (Oral)  Wt 152 lb 6.4 oz (69.128 kg) GEN: NAD, tearful CV: RRR, no m/r/g PULM: CTAB, normal effort ABD: Soft, moderately tender to light touch with no rebound or guarding, nondistended, NABS; moves to lay and sit easily. HEENT: AT/[place], MMM, crying tears, sclera clear, EOMI NEURO: Awake, alert, no focal deficit,  normal speech and gait    Assessment:     [name] [name] is a 49 y.o. female here for hospital follow up for cyclic vomiting.    Plan:     # See problem list and after visit summary for problem-specific plans. - Lyrica refilled per request.  # Itching, pt states did not get cetirizine. -Resent cetirizine for itching to trial  # Complaint re: how treated in hospital. Neither Dr McDiarmid nor Andria Frames are available today to discuss with patient. Burna Forts (clinic staff director) provided patient with feedback form and offered her to fill that out, which she took.   Follow-up: Follow up in 2-4 weeks for f/u blood sugars and cyclic vomiting symptoms.   Hilton Sinclair, MD [place]

## 2015-06-20 NOTE — Telephone Encounter (Signed)
Patient is requesting a faculty provider as her pcp.  Form given to her to complete and give back to jeannette to see what can be done. [name],CMA

## 2015-06-23 DIAGNOSIS — K921 Melena: Secondary | ICD-10-CM | POA: Insufficient documentation

## 2015-06-23 MED ORDER — PREGABALIN 25 MG PO CAPS: ORAL_CAPSULE | ORAL | Status: DC

## 2015-06-23 NOTE — Assessment & Plan Note (Signed)
Blood-streaking is likely from known hemorrhoid. Nevertheless, due for colonoscopy. Also had hematemesis, resolved. -Referral to GI placed

## 2015-06-23 NOTE — Assessment & Plan Note (Signed)
Flare resolving slowly. Some component of gastroparesis possible. Only concern that makes this flare different is blood-streaking in stool and gastroccult pos emesis in hospital. Hgb stable on discharge with no further hematemesis since. Well-appearing today with MMM and appropriately interactive. -Consider propranolol or amitriptyline for further flares - declines amitriptyline but open to other two. Wants to read about them. Printed some information. Frequently hesitant to start new meds. Informed her that all medications have long list of potential SEs but that risk is often low. -MJ cessation discussed. Hydrate.  -Refer GI for colonoscopy and cyclic vomiting -At f/u, rpt Hgb. -Immediate reeval if further hematemesis.

## 2015-06-23 NOTE — Assessment & Plan Note (Signed)
Hypoglycemic episode in the hospital - per patient this is rare. Likely related to poor PO during flare. Currently eating normally. Has been taking lantus regularly with no issues. -Continue to monitor. Discharged on lantus 10 u daily with plan to increase PRN CBGs. -F/u 2 weeks.

## 2015-07-02 ENCOUNTER — Encounter (HOSPITAL_COMMUNITY): Payer: Self-pay | Admitting: *Deleted

## 2015-07-02 ENCOUNTER — Inpatient Hospital Stay (HOSPITAL_COMMUNITY)
Admission: AD | Admit: 2015-07-02 | Discharge: 2015-07-02 | Disposition: A | Discharge status: Home / Self Care | Payer: Medicare Other | Source: Ambulatory Visit | Attending: Obstetrics & Gynecology | Admitting: Obstetrics & Gynecology

## 2015-07-02 DIAGNOSIS — N760 Acute vaginitis: Secondary | ICD-10-CM | POA: Diagnosis not present

## 2015-07-02 DIAGNOSIS — R112 Nausea with vomiting, unspecified: Secondary | ICD-10-CM | POA: Diagnosis present

## 2015-07-02 DIAGNOSIS — A499 Bacterial infection, unspecified: Secondary | ICD-10-CM | POA: Diagnosis not present

## 2015-07-02 DIAGNOSIS — B9689 Other specified bacterial agents as the cause of diseases classified elsewhere: Secondary | ICD-10-CM

## 2015-07-02 DIAGNOSIS — R110 Nausea: Secondary | ICD-10-CM

## 2015-07-02 HISTORY — DX: Other impaction of intestine: K56.49

## 2015-07-02 LAB — WET PREP, GENITAL
Trich, Wet Prep: NONE SEEN
Yeast Wet Prep HPF POC: NONE SEEN

## 2015-07-02 LAB — URINALYSIS, ROUTINE W REFLEX MICROSCOPIC
Bilirubin Urine: NEGATIVE
Glucose, UA: 250 mg/dL — AB
Ketones, ur: NEGATIVE mg/dL
Leukocytes, UA: NEGATIVE
NITRITE: NEGATIVE
PROTEIN: NEGATIVE mg/dL
Specific Gravity, Urine: 1.01 (ref 1.005–1.030)
UROBILINOGEN UA: 0.2 mg/dL (ref 0.0–1.0)
pH: 6 (ref 5.0–8.0)

## 2015-07-02 LAB — URINE MICROSCOPIC-ADD ON: WBC, UA: NONE SEEN WBC/hpf (ref <3)

## 2015-07-02 MED ORDER — METOCLOPRAMIDE HCL 10 MG PO TABS
10.0000 mg | ORAL_TABLET | Freq: Once | ORAL | Status: AC
  Administered 2015-07-02: 10 mg via ORAL
  Filled 2015-07-02: qty 1

## 2015-07-02 MED ORDER — METRONIDAZOLE 500 MG PO TABS: 500.0000 mg | ORAL_TABLET | Freq: Two times a day (BID) | ORAL | Status: DC

## 2015-07-02 MED ORDER — ONDANSETRON 8 MG PO TBDP
8.0000 mg | ORAL_TABLET | Freq: Once | ORAL | Status: AC
  Administered 2015-07-02: 8 mg via ORAL
  Filled 2015-07-02: qty 1

## 2015-07-02 MED ORDER — PROMETHAZINE HCL 12.5 MG PO TABS: 12.5000 mg | ORAL_TABLET | Freq: Four times a day (QID) | ORAL | Status: DC | PRN

## 2015-07-02 NOTE — MAU Provider Note (Signed)
[name] [name] is a 49 y.o. female with significant past medical history of cyclic nausea and vomiting who presents to the MAU with a complaint of a vaginal bump with some discharge. She states that she first noticed one bump on the labial lip about 2 days ago and then noticed 1 day ago that there was some fluid on her underwear where the bump was located. She states that the bump itches but she has not looked at it. She then stated that she noticed some white purulent discharge with a fishy odor this morning following urination. She endorses malaise stating that she starts to experience the cyclic nausea and vomiting when she gets an infection. She noticed the nausea yesterday and vomited this morning. She denies fever, dysuria, hematuria, vaginal bleeding, or increased frequency.  She states that she has been with the same partner for 14 years and they most recently had sexual intercourse on 06/28/15 and did not use a condom. She states that on 06/29/15 he told her that she was not the only partner that he had. She is pretty upset about the situation.  Pertinent Gynecological History: Contraception: Hysterectomy 02/2002 Sexually transmitted diseases: currently at risk Last mammogram: normal Date: 06/20/2009 OB History: G7, P0034   Menstrual History: No LMP recorded. Patient has had a hysterectomy.    Past Medical History  Diagnosis Date  . PANCREATITIS 05/09/2008  . Cyclic vomiting syndrome   . Hypertension   . Complication of anesthesia     "problems waking up"  . Heart murmur   . Myocardial infarction 07/2007    "they say I've had a silent one; I don't know"  . Pneumonia   . Anemia   . Blood transfusion   . GERD (gastroesophageal reflux disease)   . Anxiety   . Depression   . Smoker   . Marijuana smoker, continuous   . NEUROPATHY, DIABETIC 02/23/2007  . Type II diabetes mellitus   . Diabetic gastroparesis     /e-chart  . Chronic kidney disease (CKD), stage III (moderate)   . Nausea &  vomiting 06/10/2015  . Intestinal impaction     Past Surgical History  Procedure Laterality Date  . Port-a-cath placement  ~ 2008    right chest; "poor access; frequent hospitalizations"  . Laparotomy and lysis of adhesions    . Abdominal hysterectomy  02/2002  . Cholecystectomy  1980's  . Cardiac catheterization    . Dilation and evacuation  X3  . Left hand surgery    . Cataract extraction w/ intraocular lens  implant, bilateral    . Colonoscopy    . Upper gi endoscopy    . Cesarean section      Family History  Problem Relation Age of Onset  . Diabetes type II Mother   . Diabetes Mother   . Hypertension Mother   . Diabetes type II Sister   . Diabetes Sister   . Hypertension Sister   . Hypertension Brother     Social History:  reports that she has been smoking Cigarettes.  She has a 11 pack-year smoking history. She has never used smokeless tobacco. She reports that she uses illicit drugs (Marijuana). She reports that she does not drink alcohol.  Allergies:  Allergies  Allergen Reactions  . Bee Venom Anaphylaxis  . Acetaminophen Nausea And Vomiting  . Novolog [Insulin Aspart] Other (See Comments)    Muscles in feet cramp  . Other     Sorbaview causes rash  . Tegaderm Ag  Mesh [Silver]     Blisters  . Erythromycin Nausea And Vomiting    Prescriptions prior to admission  Medication Sig Dispense Refill Last Dose  . LORazepam (ATIVAN) 1 MG tablet Take 1 tablet (1 mg total) by mouth 2 (two) times daily as needed (cyclic vomiting symptoms). Fill on/after 06/10/15. 45 tablet 0 07/02/2015 at 0600  . aspirin EC 81 MG tablet Take 1 tablet (81 mg total) by mouth daily. (Patient not taking: Reported on 06/10/2015) 90 tablet 3 Not Taking at Unknown time  . blood glucose meter kit and supplies KIT Dispense based on patient and insurance preference. Use up to four times daily as directed. (FOR ICD-9 250.00, 250.01). 1 each 0   . carvedilol (COREG) 25 MG tablet Take 1 tablet (25 mg  total) by mouth 2 (two) times daily with a meal. 60 tablet 2 06/09/2015 at 0800  . cetirizine (ZYRTEC) 10 MG tablet Take 1 tablet (10 mg total) by mouth daily. 30 tablet 11   . hydrALAZINE (APRESOLINE) 10 MG tablet Take 1 tablet (10 mg total) by mouth 3 (three) times daily. 90 tablet 2 06/08/2015  . insulin glargine (LANTUS) 100 UNIT/ML injection Inject 0.22 mLs (22 Units total) into the skin daily. 10 mL 2 06/09/2015 at Unknown time  . lisinopril (PRINIVIL,ZESTRIL) 40 MG tablet Take 1 tablet (40 mg total) by mouth daily. 30 tablet 2 06/07/2015  . oxyCODONE (OXY IR/ROXICODONE) 5 MG immediate release tablet Take one tab by mouth up to twice a day as needed for severe pain 15 tablet 0 Past Week at Unknown time  . polyethylene glycol (MIRALAX / GLYCOLAX) packet Take 17 g by mouth daily as needed. May increase in 2-3 days to 2 times daily, Until stooling regularly (Patient not taking: Reported on 06/10/2015) 30 packet prn Not Taking at Unknown time  . pregabalin (LYRICA) 25 MG capsule TAKE 1 TABLET BY MOUTH AT BEDTIME AS NEEDED 30 capsule 1   . promethazine (PHENERGAN) 25 MG suppository Place 1 suppository (25 mg total) rectally every 6 (six) hours as needed for nausea. 20 each 0 06/07/2015    Review of Systems  Constitutional: Positive for malaise/fatigue. Negative for fever and chills.  Gastrointestinal: Positive for nausea and vomiting.  Genitourinary: Negative for dysuria, urgency and hematuria.       Endorses vaginal discharge and vaginal itching    Blood pressure 152/80, pulse 74, temperature 98.2 F (36.8 C), temperature source Oral, resp. rate 18. Physical Exam  Constitutional: She is oriented to person, place, and time. She appears well-developed and well-nourished. No distress.  Respiratory: Effort normal.  Genitourinary: There is lesion (small cyst-like bump with lesion; non blistered ) on the left labia. No tenderness (no labial tenderness at site of lesion) in the vagina. Vaginal discharge  (mild tenderness in posterior vaginal vault) found.  Cervix surgically absent . Moderate white milky discharge in vaginal vault present. Mild tenderness upon bimanual in posterior vaginal vault.  Neurological: She is alert and oriented to person, place, and time.  Skin: She is not diaphoretic.  Psychiatric: Her behavior is normal.  Patient is tearful     Results for orders placed or performed during the hospital encounter of 07/02/15 (from the past 24 hour(s))  Urinalysis, Routine w reflex microscopic (not at [facility])     Status: Abnormal   Collection Time: 07/02/15 11:18 AM  Result Value Ref Range   Color, Urine YELLOW YELLOW   APPearance CLEAR CLEAR   Specific Gravity, Urine 1.010 1.005 -  1.030   pH 6.0 5.0 - 8.0   Glucose, UA 250 (A) NEGATIVE mg/dL   Hgb urine dipstick SMALL (A) NEGATIVE   Bilirubin Urine NEGATIVE NEGATIVE   Ketones, ur NEGATIVE NEGATIVE mg/dL   Protein, ur NEGATIVE NEGATIVE mg/dL   Urobilinogen, UA 0.2 0.0 - 1.0 mg/dL   Nitrite NEGATIVE NEGATIVE   Leukocytes, UA NEGATIVE NEGATIVE  Urine microscopic-add on     Status: None   Collection Time: 07/02/15 11:18 AM  Result Value Ref Range   Squamous Epithelial / LPF RARE RARE   WBC, UA  <3 WBC/hpf    NO FORMED ELEMENTS SEEN ON URINE MICROSCOPIC EXAMINATION   RBC / HPF 0-2 <3 RBC/hpf   Bacteria, UA RARE RARE  Wet prep, genital     Status: Abnormal   Collection Time: 07/02/15 12:59 PM  Result Value Ref Range   Yeast Wet Prep HPF POC NONE SEEN NONE SEEN   Trich, Wet Prep NONE SEEN NONE SEEN   Clue Cells Wet Prep HPF POC FEW (A) NONE SEEN   WBC, Wet Prep HPF POC FEW (A) NONE SEEN    No results found.  Assessment/Plan:    ICD-9-CM ICD-10-CM   1. BV (bacterial vaginosis) 616.10 N76.0    041.9 A49.9   2. Chronic nausea 787.02 R11.0     Bacterial vaginosis: flagyl 500 mg BID for 14 days. No alcohol. Condoms always . GC pending, HIV and RPR pending.  Chronic nausea: Phenergan PRN; follow up with PCP     Roland Rack 07/02/2015, 12:15 PM

## 2015-07-02 NOTE — MAU Note (Signed)
C/o vaginal discharge started today; c/o possible STD; c/o bump on vagina for past 2 days;

## 2015-07-02 NOTE — Discharge Instructions (Signed)
Bacterial Vaginosis Bacterial vaginosis is a vaginal infection that occurs when the normal balance of bacteria in the vagina is disrupted. It results from an overgrowth of certain bacteria. This is the most common vaginal infection in women of childbearing age. Treatment is important to prevent complications, especially in pregnant women, as it can cause a premature delivery. CAUSES  Bacterial vaginosis is caused by an increase in harmful bacteria that are normally present in smaller amounts in the vagina. Several different kinds of bacteria can cause bacterial vaginosis. However, the reason that the condition develops is not fully understood. RISK FACTORS Certain activities or behaviors can put you at an increased risk of developing bacterial vaginosis, including:  Having a new sex partner or multiple sex partners.  Douching.  Using an intrauterine device (IUD) for contraception. Women do not get bacterial vaginosis from toilet seats, bedding, swimming pools, or contact with objects around them. SIGNS AND SYMPTOMS  Some women with bacterial vaginosis have no signs or symptoms. Common symptoms include:  Grey vaginal discharge.  A fishlike odor with discharge, especially after sexual intercourse.  Itching or burning of the vagina and vulva.  Burning or pain with urination. DIAGNOSIS  Your health care provider will take a medical history and examine the vagina for signs of bacterial vaginosis. A sample of vaginal fluid may be taken. Your health care provider will look at this sample under a microscope to check for bacteria and abnormal cells. A vaginal pH test may also be done.  TREATMENT  Bacterial vaginosis may be treated with antibiotic medicines. These may be given in the form of a pill or a vaginal cream. A second round of antibiotics may be prescribed if the condition comes back after treatment.  HOME CARE INSTRUCTIONS   Only take over-the-counter or prescription medicines as  directed by your health care provider.  If antibiotic medicine was prescribed, take it as directed. Make sure you finish it even if you start to feel better.  Do not have sex until treatment is completed.  Tell all sexual partners that you have a vaginal infection. They should see their health care provider and be treated if they have problems, such as a mild rash or itching.  Practice safe sex by using condoms and only having one sex partner. SEEK MEDICAL CARE IF:   Your symptoms are not improving after 3 days of treatment.  You have increased discharge or pain.  You have a fever. MAKE SURE YOU:   Understand these instructions.  Will watch your condition.  Will get help right away if you are not doing well or get worse. FOR MORE INFORMATION  Centers for Disease Control and Prevention, Division of STD Prevention: www.cdc.gov/std American Sexual Health Association (ASHA): www.ashastd.org  Document Released: 12/13/2005 Document Revised: 10/03/2013 Document Reviewed: 07/25/2013 ExitCare Patient Information 2015 [employer]. This information is not intended to replace advice given to you by your health care provider. Make sure you discuss any questions you have with your health care provider.  

## 2015-07-03 LAB — GC/CHLAMYDIA PROBE AMP (~~LOC~~) NOT AT ARMC
Chlamydia: NEGATIVE
Neisseria Gonorrhea: NEGATIVE

## 2015-07-03 LAB — HIV ANTIBODY (ROUTINE TESTING W REFLEX): HIV SCREEN 4TH GENERATION: NONREACTIVE

## 2015-07-06 LAB — HERPES SIMPLEX VIRUS CULTURE
Culture: NOT DETECTED
Special Requests: NORMAL

## 2015-07-09 ENCOUNTER — Ambulatory Visit (INDEPENDENT_AMBULATORY_CARE_PROVIDER_SITE_OTHER): Payer: Medicare Other | Admitting: Family Medicine

## 2015-07-09 ENCOUNTER — Encounter: Payer: Self-pay | Admitting: Family Medicine

## 2015-07-09 VITALS — BP 134/64 | HR 56 | Temp 98.4°F | Wt 150.0 lb

## 2015-07-09 DIAGNOSIS — F411 Generalized anxiety disorder: Secondary | ICD-10-CM | POA: Diagnosis not present

## 2015-07-09 DIAGNOSIS — R3989 Other symptoms and signs involving the genitourinary system: Secondary | ICD-10-CM | POA: Diagnosis not present

## 2015-07-09 DIAGNOSIS — R319 Hematuria, unspecified: Secondary | ICD-10-CM

## 2015-07-09 HISTORY — DX: Hematuria, unspecified: R31.9

## 2015-07-09 LAB — POCT URINALYSIS DIPSTICK
Bilirubin, UA: NEGATIVE
GLUCOSE UA: NEGATIVE
Ketones, UA: NEGATIVE
Nitrite, UA: NEGATIVE
Protein, UA: 30
SPEC GRAV UA: 1.015
UROBILINOGEN UA: 0.2
pH, UA: 5.5

## 2015-07-09 MED ORDER — LORAZEPAM 1 MG PO TABS: 1.0000 mg | ORAL_TABLET | Freq: Two times a day (BID) | ORAL | Status: DC | PRN

## 2015-07-09 NOTE — Assessment & Plan Note (Signed)
She reports taking Ativan daily for her anxiety and cyclic vomiting syndrome.  She reports she has had these since her hysterectomy Refilled Ativan # 15.

## 2015-07-09 NOTE — Progress Notes (Signed)
   Subjective:    Patient ID: [name] [name], female    DOB: 1966/12/13, 49 y.o.   MRN: [id number]  Seen for Same day visit for   CC: hematuria  Reports noticing some lightly red tinged urine, starting yesterday.  She denies any dysuria or vaginal pain/discharge/itching.  She reports history of chronic kidney disease and recent pyelonephritis several months ago; she denies any flank pain over the last couple days, or fevers.  She has continued to have some nausea which is chronic for her.  She denies history of kidney stones  Review of Systems   See HPI for ROS. Objective:  BP 134/64 mmHg  Pulse 56  Temp(Src) 98.4 F (36.9 C) (Oral)  Wt 150 lb (68.04 kg)  General: NAD Abdomen: soft, nontender, nondistended, Bowel sounds present. No CVA tenderness    Assessment & Plan:  See Problem List Documentation

## 2015-07-09 NOTE — Assessment & Plan Note (Signed)
Mild hematuria without signs of infection or stones.  - No treatment today.  We'll recheck UA in 2 weeks

## 2015-07-09 NOTE — Patient Instructions (Signed)
It was great seeing you today.   1. There is some blood in your urine today but no signs of infection. No need for antibiotics.  2. We will recheck your urine at that visit.    Please bring all your medications to every doctors visit  Sign up for My Chart to have easy access to your labs results, and communication with your Primary care physician.  Next Appointment  You have an appointment with Dr Berkley Harvey 7/22 @ 2pm   I look forward to talking with you again at our next visit. If you have any questions or concerns before then, please call the clinic at [phone].  Take Care,   Dr Phill Myron

## 2015-07-18 ENCOUNTER — Encounter: Payer: Self-pay | Admitting: Family Medicine

## 2015-07-18 ENCOUNTER — Ambulatory Visit (INDEPENDENT_AMBULATORY_CARE_PROVIDER_SITE_OTHER): Payer: Medicare Other | Admitting: Family Medicine

## 2015-07-18 VITALS — BP 152/50 | HR 56 | Temp 98.3°F | Ht 61.0 in | Wt 152.4 lb

## 2015-07-18 DIAGNOSIS — G43A1 Cyclical vomiting, intractable: Secondary | ICD-10-CM | POA: Diagnosis not present

## 2015-07-18 DIAGNOSIS — Z87448 Personal history of other diseases of urinary system: Secondary | ICD-10-CM

## 2015-07-18 DIAGNOSIS — F411 Generalized anxiety disorder: Secondary | ICD-10-CM

## 2015-07-18 DIAGNOSIS — G8929 Other chronic pain: Secondary | ICD-10-CM

## 2015-07-18 DIAGNOSIS — Z7189 Other specified counseling: Secondary | ICD-10-CM | POA: Diagnosis not present

## 2015-07-18 DIAGNOSIS — R1115 Cyclical vomiting syndrome unrelated to migraine: Secondary | ICD-10-CM

## 2015-07-18 LAB — POCT UA - MICROSCOPIC ONLY

## 2015-07-18 LAB — POCT URINALYSIS DIPSTICK
Bilirubin, UA: NEGATIVE
GLUCOSE UA: NEGATIVE
Leukocytes, UA: NEGATIVE
NITRITE UA: NEGATIVE
PROTEIN UA: NEGATIVE
Spec Grav, UA: 1.02
Urobilinogen, UA: 0.2
pH, UA: 6

## 2015-07-18 MED ORDER — LORAZEPAM 1 MG PO TABS: 1.0000 mg | ORAL_TABLET | Freq: Two times a day (BID) | ORAL | Status: DC | PRN

## 2015-07-18 MED ORDER — OXYCODONE HCL 5 MG PO TABS: ORAL_TABLET | ORAL | Status: DC

## 2015-07-18 NOTE — Patient Instructions (Signed)
It was great seeing you today.   1. Think about what we talked about today and we will talk about the next step at the next visit.   I look forward to talking with you again at our next visit. If you have any questions or concerns before then, please call the clinic at [phone].  Take Care,   Dr Phill Myron

## 2015-07-18 NOTE — Assessment & Plan Note (Addendum)
Long discussion today about cyclic vomiting syndrome and cannabinoid hyperemesis.  Discussed multiple factors influencing her vomiting episodes.  - Daily THC use.  Recommended stopping - Chronic opiate use.  Discussed that I did not want to continue opiate and concomitant benzo medications - Connection to migraines - recommended trial of amitriptyline.  She is considering.  This would help with migraines, cyclic vomiting syndrome, anxiety as well as chronic pain  - Discussed need for long-term psychiatry, psychology counseling.  She declines and is hesitant due to not wanting to retell her story again to another person.  Becomes tearful and just to mention of "anxiety, depression, anniversary's"  - Nausea possibly affected by underlying CKD - may continue in follow-up with Dr. Joelyn Oms at [facility] on 8/1 discuss next step.  Would like to discontinue opiate therapy, start amitriptyline +/- SSRI - refilled Ativan # 20; Refilled Oxy IR # 15

## 2015-07-18 NOTE — Progress Notes (Signed)
  Patient name: [name] [name] MRN [id number]  Date of birth: [birth year]  CC & HPI:  [name] [name] is a 49 y.o. female presenting today for f/u of cyclic vomiting syndrome.  - Started after hysterectomy, and post-op complication with "bowel problem" states "I almost died"  - Episode more freq initially, but now only occur several times a year.  Usually exacerbated by stress, anniversary's, holidays (gets tearful mentioning this) - Reports.  2.  Gastric emptying studies that were negative at [facility], last done several years ago - Status post cholecystectomy - History of migraines - Uses marijuana daily; previous attempts at stopping resulted in vomiting episodes - Followed by [facility] for CKD  - Endorses anxiety, depression and seasonal affective disorder worsened during fall and wintertime.  Previously seen for counseling but states she does not want to relive that anymore, and have to tell her story to someone new since her counselor retired - Uses oxycodone on a when necessary for pain associated with cyclic vomiting episodes  ROS: See HPI   Medical & Surgical Hx:  Reviewed  Medications & Allergies: Reviewed  Social History: Reviewed:   Objective Findings:  Vitals: BP 152/50 mmHg  Pulse 56  Temp(Src) 98.3 F (36.8 C) (Oral)  Ht 5\' 1"  (1.549 m)  Wt 152 lb 7 oz (69.145 kg)  BMI 28.82 kg/m2  Gen: NAD CV: RRR w/o m/r/g, pulses +2 b/l Resp: CTAB w/ normal respiratory effort GI: No skin changes; BS + x 4 quads; No tenderness or masses, No CVA tenderness  Assessment & Plan:   Please See Problem Focused Assessment & Plan

## 2015-07-28 ENCOUNTER — Ambulatory Visit: Payer: Self-pay | Admitting: Family Medicine

## 2015-07-28 DIAGNOSIS — I10 Essential (primary) hypertension: Secondary | ICD-10-CM | POA: Diagnosis not present

## 2015-07-28 DIAGNOSIS — D649 Anemia, unspecified: Secondary | ICD-10-CM | POA: Diagnosis not present

## 2015-07-28 DIAGNOSIS — E119 Type 2 diabetes mellitus without complications: Secondary | ICD-10-CM | POA: Diagnosis not present

## 2015-07-28 DIAGNOSIS — N183 Chronic kidney disease, stage 3 (moderate): Secondary | ICD-10-CM | POA: Diagnosis not present

## 2015-08-03 ENCOUNTER — Encounter (HOSPITAL_COMMUNITY): Payer: Self-pay | Admitting: Physical Medicine and Rehabilitation

## 2015-08-03 ENCOUNTER — Emergency Department (HOSPITAL_COMMUNITY)
Admission: EM | Admit: 2015-08-03 | Discharge: 2015-08-03 | Disposition: A | Discharge status: Home / Self Care | Payer: Medicare Other | Attending: Emergency Medicine | Admitting: Emergency Medicine

## 2015-08-03 DIAGNOSIS — I252 Old myocardial infarction: Secondary | ICD-10-CM | POA: Insufficient documentation

## 2015-08-03 DIAGNOSIS — Z794 Long term (current) use of insulin: Secondary | ICD-10-CM | POA: Insufficient documentation

## 2015-08-03 DIAGNOSIS — Z862 Personal history of diseases of the blood and blood-forming organs and certain disorders involving the immune mechanism: Secondary | ICD-10-CM | POA: Insufficient documentation

## 2015-08-03 DIAGNOSIS — Z8701 Personal history of pneumonia (recurrent): Secondary | ICD-10-CM | POA: Diagnosis not present

## 2015-08-03 DIAGNOSIS — Z7982 Long term (current) use of aspirin: Secondary | ICD-10-CM | POA: Insufficient documentation

## 2015-08-03 DIAGNOSIS — I129 Hypertensive chronic kidney disease with stage 1 through stage 4 chronic kidney disease, or unspecified chronic kidney disease: Secondary | ICD-10-CM | POA: Diagnosis not present

## 2015-08-03 DIAGNOSIS — R1013 Epigastric pain: Secondary | ICD-10-CM | POA: Diagnosis not present

## 2015-08-03 DIAGNOSIS — Z79899 Other long term (current) drug therapy: Secondary | ICD-10-CM | POA: Diagnosis not present

## 2015-08-03 DIAGNOSIS — Z720 Tobacco use: Secondary | ICD-10-CM | POA: Diagnosis not present

## 2015-08-03 DIAGNOSIS — R011 Cardiac murmur, unspecified: Secondary | ICD-10-CM | POA: Diagnosis not present

## 2015-08-03 DIAGNOSIS — Z8719 Personal history of other diseases of the digestive system: Secondary | ICD-10-CM | POA: Insufficient documentation

## 2015-08-03 DIAGNOSIS — R1115 Cyclical vomiting syndrome unrelated to migraine: Secondary | ICD-10-CM

## 2015-08-03 DIAGNOSIS — N183 Chronic kidney disease, stage 3 (moderate): Secondary | ICD-10-CM | POA: Diagnosis not present

## 2015-08-03 DIAGNOSIS — R110 Nausea: Secondary | ICD-10-CM | POA: Diagnosis not present

## 2015-08-03 DIAGNOSIS — E1143 Type 2 diabetes mellitus with diabetic autonomic (poly)neuropathy: Secondary | ICD-10-CM | POA: Diagnosis not present

## 2015-08-03 DIAGNOSIS — E1149 Type 2 diabetes mellitus with other diabetic neurological complication: Secondary | ICD-10-CM | POA: Insufficient documentation

## 2015-08-03 DIAGNOSIS — Z9889 Other specified postprocedural states: Secondary | ICD-10-CM | POA: Diagnosis not present

## 2015-08-03 DIAGNOSIS — G43A0 Cyclical vomiting, not intractable: Secondary | ICD-10-CM | POA: Insufficient documentation

## 2015-08-03 DIAGNOSIS — F419 Anxiety disorder, unspecified: Secondary | ICD-10-CM | POA: Insufficient documentation

## 2015-08-03 DIAGNOSIS — R1110 Vomiting, unspecified: Secondary | ICD-10-CM | POA: Diagnosis present

## 2015-08-03 LAB — COMPREHENSIVE METABOLIC PANEL
ALT: 10 U/L — ABNORMAL LOW (ref 14–54)
ANION GAP: 7 (ref 5–15)
AST: 17 U/L (ref 15–41)
Albumin: 3.5 g/dL (ref 3.5–5.0)
Alkaline Phosphatase: 63 U/L (ref 38–126)
BUN: 31 mg/dL — AB (ref 6–20)
CHLORIDE: 110 mmol/L (ref 101–111)
CO2: 22 mmol/L (ref 22–32)
Calcium: 9.2 mg/dL (ref 8.9–10.3)
Creatinine, Ser: 1.9 mg/dL — ABNORMAL HIGH (ref 0.44–1.00)
GFR calc Af Amer: 34 mL/min — ABNORMAL LOW (ref >60)
GFR calc non Af Amer: 30 mL/min — ABNORMAL LOW (ref >60)
Glucose, Bld: 246 mg/dL — ABNORMAL HIGH (ref 65–99)
Potassium: 4.6 mmol/L (ref 3.5–5.1)
Sodium: 139 mmol/L (ref 135–145)
Total Bilirubin: 0.7 mg/dL (ref 0.3–1.2)
Total Protein: 6.5 g/dL (ref 6.5–8.1)

## 2015-08-03 LAB — CBC WITH DIFFERENTIAL/PLATELET
Basophils Absolute: 0 10*3/uL (ref 0.0–0.1)
Basophils Relative: 0 % (ref 0–1)
EOS ABS: 0.1 10*3/uL (ref 0.0–0.7)
Eosinophils Relative: 2 % (ref 0–5)
HCT: 29.8 % — ABNORMAL LOW (ref 36.0–46.0)
Hemoglobin: 10 g/dL — ABNORMAL LOW (ref 12.0–15.0)
Lymphocytes Relative: 36 % (ref 12–46)
Lymphs Abs: 1.8 10*3/uL (ref 0.7–4.0)
MCH: 30.8 pg (ref 26.0–34.0)
MCHC: 33.6 g/dL (ref 30.0–36.0)
MCV: 91.7 fL (ref 78.0–100.0)
Monocytes Absolute: 0.3 10*3/uL (ref 0.1–1.0)
Monocytes Relative: 6 % (ref 3–12)
Neutro Abs: 2.7 10*3/uL (ref 1.7–7.7)
Neutrophils Relative %: 56 % (ref 43–77)
Platelets: 185 10*3/uL (ref 150–400)
RBC: 3.25 MIL/uL — ABNORMAL LOW (ref 3.87–5.11)
RDW: 14.3 % (ref 11.5–15.5)
WBC: 4.8 10*3/uL (ref 4.0–10.5)

## 2015-08-03 LAB — LIPASE, BLOOD: LIPASE: 51 U/L (ref 22–51)

## 2015-08-03 MED ORDER — HALOPERIDOL LACTATE 5 MG/ML IJ SOLN
5.0000 mg | Freq: Once | INTRAMUSCULAR | Status: AC
  Administered 2015-08-03: 5 mg via INTRAVENOUS
  Filled 2015-08-03: qty 1

## 2015-08-03 MED ORDER — SODIUM CHLORIDE 0.9 % IV BOLUS (SEPSIS)
1000.0000 mL | Freq: Once | INTRAVENOUS | Status: AC
  Administered 2015-08-03: 1000 mL via INTRAVENOUS

## 2015-08-03 MED ORDER — HEPARIN SOD (PORK) LOCK FLUSH 100 UNIT/ML IV SOLN
500.0000 [IU] | INTRAVENOUS | Status: AC | PRN
  Administered 2015-08-03: 500 [IU]

## 2015-08-03 NOTE — ED Notes (Addendum)
Unable to establish peripheral IV access, IV team paged to access port-a-cath.

## 2015-08-03 NOTE — ED Notes (Signed)
Pt resting quietly at the time. Vital signs stable. No vomiting noted.

## 2015-08-03 NOTE — ED Notes (Signed)
IV team at bedside 

## 2015-08-03 NOTE — Discharge Instructions (Signed)
Cyclic Vomiting Syndrome °Cyclic vomiting syndrome is a benign condition in which patients experience bouts or cycles of severe nausea and vomiting that last for hours or even days. The bouts of nausea and vomiting alternate with longer periods of no symptoms and generally good health. Cyclic vomiting syndrome occurs mostly in children, but can affect adults. °CAUSES  °CVS has no known cause. Each episode is typically similar to the previous ones. The episodes tend to:  °· Start at about the same time of day. °· Last the same length of time. °· Present the same symptoms at the same level of intensity. °Cyclic vomiting syndrome can begin at any age in children and adults. Cyclic vomiting syndrome usually starts between the ages of 3 and 7 years. In adults, episodes tend to occur less often than they do in children, but they last longer. Furthermore, the events or situations that trigger episodes in adults cannot always be pinpointed as easily as they can in children. °There are 4 phases of cyclic vomiting syndrome: °1. Prodrome. The prodrome phase signals that an episode of nausea and vomiting is about to begin. This phase can last from just a few minutes to several hours. This phase is often marked by belly (abdominal) pain. Sometimes taking medicine early in the prodrome phase can stop an episode in progress. However, sometimes there is no warning. A person may simply wake up in the middle of the night or early morning and begin vomiting. °2. Episode. The episode phase consists of: °· Severe vomiting. °· Nausea. °· Gagging (retching). °3. Recovery. The recovery phase begins when the nausea and vomiting stop. Healthy color, appetite, and energy return. °4. Symptom-free interval. The symptom-free interval phase is the period between episodes when no symptoms are present. °TRIGGERS °Episodes can be triggered by an infection or event. Examples of triggers include: °· Infections. °· Colds, allergies, sinus problems, and  the flu. °· Eating certain foods such as chocolate or cheese. °· Foods with monosodium glutamate (MSG) or preservatives. °· Fast foods. °· Pre-packaged foods. °· Foods with low nutritional value (junk foods). °· Overeating. °· Eating just before going to bed. °· Hot weather. °· Dehydration. °· Not enough sleep or poor sleep quality. °· Physical exhaustion. °· Menstruation. °· Motion sickness. °· Emotional stress (school or home difficulties). °· Excitement or stress. °SYMPTOMS  °The main symptoms of cyclic vomiting syndrome are: °· Severe vomiting. °· Nausea. °· Gagging (retching). °Episodes usually begin at night or the first thing in the morning. Episodes may include vomiting or retching up to 5 or 6 times an hour during the worst of the episode. Episodes usually last anywhere from 1 to 4 days. Episodes can last for up to 10 days. Other symptoms include: °· Paleness. °· Exhaustion. °· Listlessness. °· Abdominal pain. °· Loose stools or diarrhea. °Sometimes the nausea and vomiting are so severe that a person appears to be almost unconscious. Sensitivity to light, headache, fever, dizziness, may also accompany an episode. In addition, the vomiting may cause drooling and excessive thirst. Drinking water usually leads to more vomiting, though the water can dilute the acid in the vomit, making the episode a little less painful. Continuous vomiting can lead to dehydration, which means that the body has lost excessive water and salts. °DIAGNOSIS  °Cyclic vomiting syndrome is hard to diagnose because there are no clear tests to identify it. A caregiver must diagnose cyclic vomiting syndrome by looking at symptoms and medical history. A caregiver must exclude more common diseases   or disorders that can also cause nausea and vomiting. Also, diagnosis takes time because caregivers need to identify a pattern or cycle to the vomiting. °TREATMENT  °Cyclic vomiting syndrome cannot be cured. Treatment varies, but people with  cyclic vomiting syndrome should get plenty of rest and sleep and take medications that prevent, stop, or lessen the vomiting episodes and other symptoms. °People whose episodes are frequent and long-lasting may be treated during the symptom-free intervals in an effort to prevent or ease future episodes. The symptom-free phase is a good time to eliminate anything known to trigger an episode. For example, if episodes are brought on by stress or excitement, this period is the time to find ways to reduce stress and stay calm. If sinus problems or allergies cause episodes, those conditions should be treated. The triggers listed above should be avoided or prevented. °Because of the similarities between migraine and cyclic vomiting syndrome, caregivers treat some people with severe cyclic vomiting syndrome with drugs that are also used for migraine headaches. The drugs are designed to: °· Prevent episodes. °· Reduce their frequency. °· Lessen their severity. °HOME CARE INSTRUCTIONS °Once a vomiting episode begins, treatment is supportive. It helps to stay in bed and sleep in a dark, quiet room. Severe nausea and vomiting may require hospitalization and intravenous (IV) fluids to prevent dehydration. Relaxing medications (sedatives) may help if the nausea continues. Sometimes, during the prodrome phase, it is possible to stop an episode from happening altogether. Only take over-the-counter or prescription medicines for pain, discomfort or fever as directed by your caregiver. Do not give aspirin to children. °During the recovery phase, drinking water and replacing lost electrolytes (salts in the blood) are very important. Electrolytes are salts that the body needs to function well and stay healthy. Symptoms during the recovery phase can vary. Some people find that their appetites return to normal immediately, while others need to begin by drinking clear liquids and then move slowly to solid food. °RELATED COMPLICATIONS °The  severe vomiting that defines cyclic vomiting syndrome is a risk factor for several complications: °· Dehydration--Vomiting causes the body to lose water quickly. °· Electrolyte imbalance--Vomiting also causes the body to lose the important salts it needs to keep working properly. °· Peptic esophagitis--The tube that connects the mouth to the stomach (esophagus) becomes injured from the stomach acid that comes up with the vomit. °· Hematemesis--The esophagus becomes irritated and bleeds, so blood mixes with the vomit. °· Mallory-Weiss tear--The lower end of the esophagus may tear open or the stomach may bruise from vomiting or retching. °· Tooth decay--The acid in the vomit can hurt the teeth by corroding the tooth enamel. °SEEK MEDICAL CARE IF: °You have questions or problems. °Document Released: 02/21/2002 Document Revised: 03/06/2012 Document Reviewed: 03/22/2011 °[employer]® Patient Information ©2015 [employer]. This information is not intended to replace advice given to you by your health care provider. Make sure you discuss any questions you have with your health care provider. ° °

## 2015-08-03 NOTE — ED Notes (Signed)
Pt presents to department for evaluation of epigastric pain and nausea/vomiting. Onset this morning. 10/10 pain upon arrival to ED. Pt crying upon arrival to exam room.

## 2015-08-03 NOTE — ED Provider Notes (Signed)
CSN: [id number]     Arrival date & time 08/03/15  0820 History   First MD Initiated Contact with Patient 08/03/15 0824     Chief Complaint  Patient presents with  . Emesis  . Abdominal Pain     (Consider location/radiation/quality/duration/timing/severity/associated sxs/prior Treatment) Patient is a 49 y.o. female presenting with vomiting. The history is provided by the patient.  Emesis Severity:  Moderate Duration:  1 day Timing:  Constant Quality:  Stomach contents Progression:  Worsening Chronicity:  Recurrent Recent urination:  Normal Relieved by:  Nothing Worsened by:  Nothing tried Ineffective treatments:  None tried Associated symptoms: abdominal pain (epigastric related to vomiting)   Associated symptoms: no diarrhea and no fever   Risk factors: diabetes     Past Medical History  Diagnosis Date  . PANCREATITIS 05/09/2008  . Cyclic vomiting syndrome   . Hypertension   . Complication of anesthesia     "problems waking up"  . Heart murmur   . Myocardial infarction 07/2007    "they say I've had a silent one; I don't know"  . Pneumonia   . Anemia   . Blood transfusion   . GERD (gastroesophageal reflux disease)   . Anxiety   . Depression   . Smoker   . Marijuana smoker, continuous   . NEUROPATHY, DIABETIC 02/23/2007  . Type II diabetes mellitus   . Diabetic gastroparesis     /e-chart  . Chronic kidney disease (CKD), stage III (moderate)   . Nausea & vomiting 06/10/2015  . Intestinal impaction    Past Surgical History  Procedure Laterality Date  . Port-a-cath placement  ~ 2008    right chest; "poor access; frequent hospitalizations"  . Laparotomy and lysis of adhesions    . Abdominal hysterectomy  02/2002  . Cholecystectomy  1980's  . Cardiac catheterization    . Dilation and evacuation  X3  . Left hand surgery    . Cataract extraction w/ intraocular lens  implant, bilateral    . Colonoscopy    . Upper gi endoscopy    . Cesarean section     Family  History  Problem Relation Age of Onset  . Diabetes type II Mother   . Diabetes Mother   . Hypertension Mother   . Diabetes type II Sister   . Diabetes Sister   . Hypertension Sister   . Hypertension Brother    History  Substance Use Topics  . Smoking status: Current Some Day Smoker -- 0.50 packs/day for 22 years    Types: Cigarettes  . Smokeless tobacco: Never Used  . Alcohol Use: No   OB History    Gravida Para Term Preterm AB TAB SAB Ectopic Multiple Living   7    3 3    4     Review of Systems  Gastrointestinal: Positive for vomiting and abdominal pain (epigastric related to vomiting). Negative for diarrhea.  All other systems reviewed and are negative.     Allergies  Bee venom; Acetaminophen; Novolog; Other; Tegaderm ag mesh; and Erythromycin  Home Medications   Prior to Admission medications   Medication Sig Start Date End Date Taking? Authorizing Provider  aspirin EC 81 MG tablet Take 1 tablet (81 mg total) by mouth daily. 06/02/15  Yes Maria T Thekkekandam, MD  carvedilol (COREG) 25 MG tablet Take 1 tablet (25 mg total) by mouth 2 (two) times daily with a meal. 06/02/15  Yes Maria T Thekkekandam, MD  hydrALAZINE (APRESOLINE) 10 MG tablet Take   1 tablet (10 mg total) by mouth 3 (three) times daily. 06/02/15  Yes Maria T Thekkekandam, MD  insulin glargine (LANTUS) 100 UNIT/ML injection Inject 0.22 mLs (22 Units total) into the skin daily. 06/02/15  Yes Maria T Thekkekandam, MD  lisinopril (PRINIVIL,ZESTRIL) 40 MG tablet Take 1 tablet (40 mg total) by mouth daily. 06/02/15  Yes Maria T Thekkekandam, MD  LORazepam (ATIVAN) 1 MG tablet Take 1 tablet (1 mg total) by mouth 2 (two) times daily as needed (cyclic vomiting symptoms). Fill on/after 06/10/15. 07/18/15  Yes James R Joyner, MD  oxyCODONE (OXY IR/ROXICODONE) 5 MG immediate release tablet Take one tab by mouth up to twice a day as needed for severe pain 07/18/15  Yes James R Joyner, MD  pregabalin (LYRICA) 25 MG capsule TAKE 1  TABLET BY MOUTH AT BEDTIME AS NEEDED Patient taking differently: Take 25 mg by mouth at bedtime as needed (pain).  06/23/15  Yes Maria T Thekkekandam, MD  promethazine (PHENERGAN) 12.5 MG tablet Take 1 tablet (12.5 mg total) by mouth every 6 (six) hours as needed for nausea or vomiting. 07/02/15  Yes [name] I Rasch, NP  blood glucose meter kit and supplies KIT Dispense based on patient and insurance preference. Use up to four times daily as directed. (FOR ICD-9 250.00, 250.01). 06/02/15   Maria T Thekkekandam, MD  cetirizine (ZYRTEC) 10 MG tablet Take 1 tablet (10 mg total) by mouth daily. Patient not taking: Reported on 08/03/2015 06/20/15   Maria T Thekkekandam, MD  polyethylene glycol (MIRALAX / GLYCOLAX) packet Take 17 g by mouth daily as needed. May increase in 2-3 days to 2 times daily, Until stooling regularly Patient not taking: Reported on 08/03/2015 06/02/15   Maria T Thekkekandam, MD   BP 153/96 mmHg  Pulse 62  Temp(Src) 98.4 F (36.9 C) (Oral)  Resp 16  SpO2 100% Physical Exam  Constitutional: She is oriented to person, place, and time. She appears well-developed and well-nourished. No distress.  HENT:  Head: Normocephalic.  Eyes: Conjunctivae are normal.  Neck: Neck supple. No tracheal deviation present.  Cardiovascular: Normal rate, regular rhythm and normal heart sounds.   Pulmonary/Chest: Effort normal and breath sounds normal. No respiratory distress.  Abdominal: Soft. She exhibits no distension. There is tenderness (mild epigastric, no peritonitis). There is no rebound and no guarding.  Neurological: She is alert and oriented to person, place, and time.  Skin: Skin is warm and dry.  Psychiatric: She has a normal mood and affect.    ED Course  Procedures (including critical care time) Labs Review Labs Reviewed  CBC WITH DIFFERENTIAL/PLATELET - Abnormal; Notable for the following:    RBC 3.25 (*)    Hemoglobin 10.0 (*)    HCT 29.8 (*)    All other components within normal  limits  COMPREHENSIVE METABOLIC PANEL - Abnormal; Notable for the following:    Glucose, Bld 246 (*)    BUN 31 (*)    Creatinine, Ser 1.90 (*)    ALT 10 (*)    GFR calc non Af Amer 30 (*)    GFR calc Af Amer 34 (*)    All other components within normal limits  LIPASE, BLOOD    Imaging Review No results found.   EKG Interpretation None      MDM   Final diagnoses:  Non-intractable cyclical vomiting with nausea    49-year-old female with previously diagnosed cyclic vomiting syndrome presents with recurrent episode of nausea, emesis, upper abdominal pain that is typical   of an exacerbation for her. She states that Zofran does not work for her symptoms typically. She does have a history of chronic kidney disease with baseline creatinine between 1.5 and 2. She also has history of hypertension and is diabetic, screening labs were ordered to evaluate for end organ damage or complication of diabetes.  Labs reassuring, symptoms relieved by haldol IV and no vomiting during ED course. Given fluids for hyperglycemia, stable for discharge. Plan to follow up with PCP as needed and return precautions discussed for worsening or new concerning symptoms.   Leo Grosser, MD 08/03/15 2232

## 2015-08-11 ENCOUNTER — Ambulatory Visit: Payer: Medicare Other | Admitting: Podiatry

## 2015-08-13 ENCOUNTER — Other Ambulatory Visit: Payer: Self-pay | Admitting: Family Medicine

## 2015-08-13 DIAGNOSIS — I10 Essential (primary) hypertension: Secondary | ICD-10-CM

## 2015-08-13 DIAGNOSIS — E1121 Type 2 diabetes mellitus with diabetic nephropathy: Secondary | ICD-10-CM

## 2015-08-13 DIAGNOSIS — F411 Generalized anxiety disorder: Secondary | ICD-10-CM

## 2015-08-13 MED ORDER — LISINOPRIL 40 MG PO TABS: 40.0000 mg | ORAL_TABLET | Freq: Every day | ORAL | Status: DC

## 2015-08-13 NOTE — Telephone Encounter (Signed)
Please call, and let her I know I sent in Rx for lisinopril, but see needs to be seen prior to getting refills on Ativan. She had an appointment to discuss her cyclic vomiting / ativan that she did not show for. If she is unable to wait until her next appointment then she can make an appointment with sameday and see if they would be willing to give her a short-term prescription until her next appointment with me. However, she need to make appointments with me for any refills on pain medications or ativan prior to running out.

## 2015-08-13 NOTE — Telephone Encounter (Signed)
Need refill on her Lorazepam and Lisinopril.  Took last pills of both meds this am.  Have appt scheduled for next Thur on 8/25 @ 10:30

## 2015-08-19 ENCOUNTER — Encounter (HOSPITAL_COMMUNITY): Payer: Self-pay | Admitting: Emergency Medicine

## 2015-08-19 ENCOUNTER — Emergency Department (HOSPITAL_COMMUNITY): Payer: Medicare Other

## 2015-08-19 ENCOUNTER — Emergency Department (HOSPITAL_COMMUNITY)
Admission: EM | Admit: 2015-08-19 | Discharge: 2015-08-19 | Disposition: A | Discharge status: Home / Self Care | Payer: Medicare Other | Attending: Emergency Medicine | Admitting: Emergency Medicine

## 2015-08-19 ENCOUNTER — Ambulatory Visit: Payer: Self-pay | Admitting: Family Medicine

## 2015-08-19 DIAGNOSIS — K859 Acute pancreatitis, unspecified: Secondary | ICD-10-CM | POA: Insufficient documentation

## 2015-08-19 DIAGNOSIS — Z7982 Long term (current) use of aspirin: Secondary | ICD-10-CM | POA: Diagnosis not present

## 2015-08-19 DIAGNOSIS — Z862 Personal history of diseases of the blood and blood-forming organs and certain disorders involving the immune mechanism: Secondary | ICD-10-CM | POA: Insufficient documentation

## 2015-08-19 DIAGNOSIS — R011 Cardiac murmur, unspecified: Secondary | ICD-10-CM | POA: Diagnosis not present

## 2015-08-19 DIAGNOSIS — I129 Hypertensive chronic kidney disease with stage 1 through stage 4 chronic kidney disease, or unspecified chronic kidney disease: Secondary | ICD-10-CM | POA: Diagnosis not present

## 2015-08-19 DIAGNOSIS — N183 Chronic kidney disease, stage 3 (moderate): Secondary | ICD-10-CM | POA: Diagnosis not present

## 2015-08-19 DIAGNOSIS — E119 Type 2 diabetes mellitus without complications: Secondary | ICD-10-CM | POA: Insufficient documentation

## 2015-08-19 DIAGNOSIS — Z720 Tobacco use: Secondary | ICD-10-CM | POA: Diagnosis not present

## 2015-08-19 DIAGNOSIS — I252 Old myocardial infarction: Secondary | ICD-10-CM | POA: Diagnosis not present

## 2015-08-19 DIAGNOSIS — F329 Major depressive disorder, single episode, unspecified: Secondary | ICD-10-CM | POA: Diagnosis not present

## 2015-08-19 DIAGNOSIS — F419 Anxiety disorder, unspecified: Secondary | ICD-10-CM | POA: Insufficient documentation

## 2015-08-19 DIAGNOSIS — Z79899 Other long term (current) drug therapy: Secondary | ICD-10-CM | POA: Diagnosis not present

## 2015-08-19 DIAGNOSIS — Z8701 Personal history of pneumonia (recurrent): Secondary | ICD-10-CM | POA: Insufficient documentation

## 2015-08-19 DIAGNOSIS — R05 Cough: Secondary | ICD-10-CM | POA: Diagnosis not present

## 2015-08-19 DIAGNOSIS — R079 Chest pain, unspecified: Secondary | ICD-10-CM | POA: Diagnosis present

## 2015-08-19 LAB — COMPREHENSIVE METABOLIC PANEL
ALBUMIN: 4.5 g/dL (ref 3.5–5.0)
ALK PHOS: 87 U/L (ref 38–126)
ALT: 13 U/L — AB (ref 14–54)
AST: 24 U/L (ref 15–41)
Anion gap: 10 (ref 5–15)
BUN: 31 mg/dL — AB (ref 6–20)
CALCIUM: 9.8 mg/dL (ref 8.9–10.3)
CHLORIDE: 106 mmol/L (ref 101–111)
CO2: 20 mmol/L — AB (ref 22–32)
CREATININE: 2.03 mg/dL — AB (ref 0.44–1.00)
GFR calc Af Amer: 32 mL/min — ABNORMAL LOW (ref >60)
GFR calc non Af Amer: 27 mL/min — ABNORMAL LOW (ref >60)
GLUCOSE: 318 mg/dL — AB (ref 65–99)
Potassium: 5 mmol/L (ref 3.5–5.1)
SODIUM: 136 mmol/L (ref 135–145)
Total Bilirubin: 1.3 mg/dL — ABNORMAL HIGH (ref 0.3–1.2)
Total Protein: 7.6 g/dL (ref 6.5–8.1)

## 2015-08-19 LAB — CBC WITH DIFFERENTIAL/PLATELET
BASOS PCT: 0 % (ref 0–1)
Basophils Absolute: 0 10*3/uL (ref 0.0–0.1)
Basophils Absolute: 0 10*3/uL (ref 0.0–0.1)
Basophils Relative: 0 % (ref 0–1)
EOS ABS: 0.1 10*3/uL (ref 0.0–0.7)
EOS ABS: 0.1 10*3/uL (ref 0.0–0.7)
EOS PCT: 2 % (ref 0–5)
Eosinophils Relative: 2 % (ref 0–5)
HCT: 35.3 % — ABNORMAL LOW (ref 36.0–46.0)
HCT: 32.9 % — ABNORMAL LOW (ref 36.0–46.0)
HEMOGLOBIN: 12.2 g/dL (ref 12.0–15.0)
HEMOGLOBIN: 11.3 g/dL — AB (ref 12.0–15.0)
LYMPHS ABS: 2.1 10*3/uL (ref 0.7–4.0)
LYMPHS ABS: 1.6 10*3/uL (ref 0.7–4.0)
LYMPHS PCT: 34 % (ref 12–46)
Lymphocytes Relative: 43 % (ref 12–46)
MCH: 31.3 pg (ref 26.0–34.0)
MCH: 31.5 pg (ref 26.0–34.0)
MCHC: 34.6 g/dL (ref 30.0–36.0)
MCHC: 34.3 g/dL (ref 30.0–36.0)
MCV: 90.5 fL (ref 78.0–100.0)
MCV: 91.6 fL (ref 78.0–100.0)
MONO ABS: 0.3 10*3/uL (ref 0.1–1.0)
MONOS PCT: 6 % (ref 3–12)
Monocytes Absolute: 0.3 10*3/uL (ref 0.1–1.0)
Monocytes Relative: 7 % (ref 3–12)
NEUTROS PCT: 58 % (ref 43–77)
Neutro Abs: 2.4 10*3/uL (ref 1.7–7.7)
Neutro Abs: 2.7 10*3/uL (ref 1.7–7.7)
Neutrophils Relative %: 48 % (ref 43–77)
Platelets: 172 10*3/uL (ref 150–400)
Platelets: 173 10*3/uL (ref 150–400)
RBC: 3.9 MIL/uL (ref 3.87–5.11)
RBC: 3.59 MIL/uL — ABNORMAL LOW (ref 3.87–5.11)
RDW: 14.1 % (ref 11.5–15.5)
RDW: 13.9 % (ref 11.5–15.5)
WBC: 5 10*3/uL (ref 4.0–10.5)
WBC: 4.7 10*3/uL (ref 4.0–10.5)

## 2015-08-19 LAB — LIPASE, BLOOD: LIPASE: 80 U/L — AB (ref 22–51)

## 2015-08-19 LAB — TROPONIN I

## 2015-08-19 MED ORDER — MORPHINE SULFATE (PF) 4 MG/ML IV SOLN
4.0000 mg | Freq: Once | INTRAVENOUS | Status: AC
  Administered 2015-08-19: 4 mg via INTRAVENOUS
  Filled 2015-08-19: qty 1

## 2015-08-19 MED ORDER — HEPARIN SOD (PORK) LOCK FLUSH 100 UNIT/ML IV SOLN
500.0000 [IU] | Freq: Once | INTRAVENOUS | Status: AC
  Administered 2015-08-19: 500 [IU]
  Filled 2015-08-19 (×4): qty 5

## 2015-08-19 MED ORDER — DIPHENHYDRAMINE HCL 50 MG/ML IJ SOLN
25.0000 mg | Freq: Once | INTRAMUSCULAR | Status: AC
  Administered 2015-08-19: 25 mg via INTRAVENOUS
  Filled 2015-08-19: qty 1

## 2015-08-19 MED ORDER — LORAZEPAM 1 MG PO TABS
1.0000 mg | ORAL_TABLET | Freq: Once | ORAL | Status: AC
  Administered 2015-08-19: 1 mg via ORAL
  Filled 2015-08-19: qty 1

## 2015-08-19 MED ORDER — LIDOCAINE VISCOUS 2 % MT SOLN
15.0000 mL | Freq: Once | OROMUCOSAL | Status: DC
  Filled 2015-08-19: qty 15

## 2015-08-19 MED ORDER — ONDANSETRON HCL 4 MG/2ML IJ SOLN
4.0000 mg | Freq: Once | INTRAMUSCULAR | Status: AC
  Administered 2015-08-19: 4 mg via INTRAVENOUS
  Filled 2015-08-19: qty 2

## 2015-08-19 MED ORDER — SODIUM CHLORIDE 0.9 % IV BOLUS (SEPSIS)
1000.0000 mL | Freq: Once | INTRAVENOUS | Status: AC
  Administered 2015-08-19: 1000 mL via INTRAVENOUS

## 2015-08-19 MED ORDER — OXYCODONE HCL 5 MG PO TABS
5.0000 mg | ORAL_TABLET | Freq: Once | ORAL | Status: AC
  Administered 2015-08-19: 5 mg via ORAL
  Filled 2015-08-19: qty 1

## 2015-08-19 MED ORDER — ALUM & MAG HYDROXIDE-SIMETH 200-200-20 MG/5ML PO SUSP
15.0000 mL | Freq: Once | ORAL | Status: AC
  Administered 2015-08-19: 15 mL via ORAL
  Filled 2015-08-19: qty 30

## 2015-08-19 MED ORDER — METOCLOPRAMIDE HCL 5 MG/ML IJ SOLN
10.0000 mg | Freq: Once | INTRAMUSCULAR | Status: AC
  Administered 2015-08-19: 10 mg via INTRAVENOUS
  Filled 2015-08-19: qty 2

## 2015-08-19 MED ORDER — HYDROMORPHONE HCL 1 MG/ML IJ SOLN
1.0000 mg | Freq: Once | INTRAMUSCULAR | Status: AC
  Administered 2015-08-19: 1 mg via INTRAVENOUS
  Filled 2015-08-19: qty 1

## 2015-08-19 NOTE — ED Provider Notes (Signed)
CSN: [id number]     Arrival date & time 08/19/15  0554 History   First MD Initiated Contact with Patient 08/19/15 [phone]     Chief Complaint  Patient presents with  . Chest Pain     (Consider location/radiation/quality/duration/timing/severity/associated sxs/prior Treatment) Patient is a 49 y.o. female presenting with abdominal pain. The history is provided by the patient.  Abdominal Pain Pain location:  Epigastric Pain quality: sharp and shooting   Pain radiates to:  Chest Pain severity:  Severe Onset quality:  Gradual Duration:  2 days Timing:  Constant Progression:  Worsening Chronicity:  Chronic Relieved by:  Nothing Worsened by:  Position changes Ineffective treatments:  None tried Associated symptoms: chills, nausea and vomiting   Associated symptoms: no chest pain, no cough, no dysuria, no fever and no shortness of breath    49 yo F with a chief complaint of epigastric abdominal pain. This been going on for the past couple days. Patient was also seen here about a week ago with similar pain. Patient states that today she is concerned she felt like it radiated up into her chest. Worse with vomiting. Worse with position changes. To see with some diaphoresis just before she vomits. Patient denies any diarrhea denies fevers denies chills. Thinks it feels different than her chronic pancreatitis.  Past Medical History  Diagnosis Date  . PANCREATITIS 05/09/2008  . Cyclic vomiting syndrome   . Hypertension   . Complication of anesthesia     "problems waking up"  . Heart murmur   . Myocardial infarction 07/2007    "they say I've had a silent one; I don't know"  . Pneumonia   . Anemia   . Blood transfusion   . GERD (gastroesophageal reflux disease)   . Anxiety   . Depression   . Smoker   . Marijuana smoker, continuous   . NEUROPATHY, DIABETIC 02/23/2007  . Type II diabetes mellitus   . Diabetic gastroparesis     /e-chart  . Chronic kidney disease (CKD), stage III (moderate)    . Nausea & vomiting 06/10/2015  . Intestinal impaction    Past Surgical History  Procedure Laterality Date  . Port-a-cath placement  ~ 2008    right chest; "poor access; frequent hospitalizations"  . Laparotomy and lysis of adhesions    . Abdominal hysterectomy  02/2002  . Cholecystectomy  1980's  . Cardiac catheterization    . Dilation and evacuation  X3  . Left hand surgery    . Cataract extraction w/ intraocular lens  implant, bilateral    . Colonoscopy    . Upper gi endoscopy    . Cesarean section     Family History  Problem Relation Age of Onset  . Diabetes type II Mother   . Diabetes Mother   . Hypertension Mother   . Diabetes type II Sister   . Diabetes Sister   . Hypertension Sister   . Hypertension Brother    Social History  Substance Use Topics  . Smoking status: Current Some Day Smoker -- 0.50 packs/day for 22 years    Types: Cigarettes  . Smokeless tobacco: Never Used  . Alcohol Use: No   OB History    Gravida Para Term Preterm AB TAB SAB Ectopic Multiple Living   '7    3 3    4     ' Review of Systems  Constitutional: Positive for chills. Negative for fever.  HENT: Negative for congestion and rhinorrhea.   Eyes: Negative for redness  and visual disturbance.  Respiratory: Negative for cough, shortness of breath and wheezing.   Cardiovascular: Negative for chest pain and palpitations.  Gastrointestinal: Positive for nausea, vomiting and abdominal pain.  Genitourinary: Negative for dysuria and urgency.  Musculoskeletal: Negative for myalgias and arthralgias.  Skin: Negative for pallor and wound.  Neurological: Negative for dizziness and headaches.      Allergies  Bee venom; Acetaminophen; Novolog; Other; Tegaderm ag mesh; and Erythromycin  Home Medications   Prior to Admission medications   Medication Sig Start Date End Date Taking? Authorizing Provider  aspirin EC 81 MG tablet Take 1 tablet (81 mg total) by mouth daily. 06/02/15   Hilton Sinclair, MD  blood glucose meter kit and supplies KIT Dispense based on patient and insurance preference. Use up to four times daily as directed. (FOR ICD-9 250.00, 250.01). 06/02/15   Hilton Sinclair, MD  carvedilol (COREG) 25 MG tablet Take 1 tablet (25 mg total) by mouth 2 (two) times daily with a meal. 06/02/15   Hilton Sinclair, MD  cetirizine (ZYRTEC) 10 MG tablet Take 1 tablet (10 mg total) by mouth daily. Patient not taking: Reported on 08/03/2015 06/20/15   Hilton Sinclair, MD  hydrALAZINE (APRESOLINE) 10 MG tablet Take 1 tablet (10 mg total) by mouth 3 (three) times daily. 06/02/15   Hilton Sinclair, MD  insulin glargine (LANTUS) 100 UNIT/ML injection Inject 0.22 mLs (22 Units total) into the skin daily. 06/02/15   Hilton Sinclair, MD  lisinopril (PRINIVIL,ZESTRIL) 40 MG tablet Take 1 tablet (40 mg total) by mouth daily. 08/13/15   Olam Idler, MD  LORazepam (ATIVAN) 1 MG tablet Take 1 tablet (1 mg total) by mouth 2 (two) times daily as needed (cyclic vomiting symptoms). Fill on/after 06/10/15. 07/18/15   Olam Idler, MD  oxyCODONE (OXY IR/ROXICODONE) 5 MG immediate release tablet Take one tab by mouth up to twice a day as needed for severe pain 07/18/15   Olam Idler, MD  polyethylene glycol [facility] Floria Raveling) packet Take 17 g by mouth daily as needed. May increase in 2-3 days to 2 times daily, Until stooling regularly Patient not taking: Reported on 08/03/2015 06/02/15   Hilton Sinclair, MD  pregabalin (LYRICA) 25 MG capsule TAKE 1 TABLET BY MOUTH AT BEDTIME AS NEEDED Patient taking differently: Take 25 mg by mouth at bedtime as needed (pain).  06/23/15   Hilton Sinclair, MD  promethazine (PHENERGAN) 12.5 MG tablet Take 1 tablet (12.5 mg total) by mouth every 6 (six) hours as needed for nausea or vomiting. 07/02/15   Artist Pais Rasch, NP   BP 184/94 mmHg  Pulse 61  Temp(Src) 98.4 F (36.9 C) (Oral)  Resp 16  SpO2 99% Physical Exam  Constitutional: She  is oriented to person, place, and time. She appears well-developed and well-nourished. No distress.  HENT:  Head: Normocephalic and atraumatic.  Eyes: EOM are normal. Pupils are equal, round, and reactive to light.  Neck: Normal range of motion. Neck supple.  Cardiovascular: Normal rate and regular rhythm.  Exam reveals no gallop and no friction rub.   No murmur heard. Pulmonary/Chest: Effort normal. She has no wheezes. She has no rales.  Abdominal: Soft. She exhibits no distension. There is tenderness. There is no rebound and no guarding.  Musculoskeletal: She exhibits no edema or tenderness.  Neurological: She is alert and oriented to person, place, and time.  Skin: Skin is warm and dry. She is not diaphoretic.  Psychiatric: She has a normal mood and affect. Her behavior is normal.    ED Course  Procedures (including critical care time) Labs Review Labs Reviewed  CBC WITH DIFFERENTIAL/PLATELET - Abnormal; Notable for the following:    HCT 35.3 (*)    All other components within normal limits  COMPREHENSIVE METABOLIC PANEL  TROPONIN I  LIPASE, BLOOD    Imaging Review No results found. I have personally reviewed and evaluated these images and lab results as part of my medical decision-making.   EKG Interpretation   Date/Time:  Tuesday August 19 2015 06:22:48 EDT Ventricular Rate:  68 PR Interval:  164 QRS Duration: 77 QT Interval:  406 QTC Calculation: 432 R Axis:   74 Text Interpretation:  Sinus rhythm Anteroseptal infarct, old artifact  noted No significant change since last tracing Confirmed by Christy Gentles  MD,  DONALD ([id number]) on 08/19/2015 6:26:55 AM      MDM   Final diagnoses:  None    49 yo F with a chief complaint of epigastric abdominal pain that radiates the chest. Some atypical chest pain. Patient states she's had an MI in the past that is unsure what symptoms she had. Will obtain a CBC CMP lipase treat the patient's pain and nausea. Chest x-ray. EKG without  concerning signs.  Lipase 80 patient with pancreatitis.  CBC unremarkable. CMP with creatinine just about baseline. Chest x-ray viewed by me and unremarkable.     Patient given pain and nausea medicine with improvement. Abdominal exam improved. Discussed admission versus discharge. She feels okay for discharge. She has a follow-up appointment in an hour. Requesting Ativan prescription. Will let her PCP make that determination.  Deno Etienne, DO 08/19/15 [phone]

## 2015-08-19 NOTE — ED Notes (Signed)
Patient transported to X-ray 

## 2015-08-19 NOTE — ED Notes (Signed)
Pt reports that "I have not felt well for three days" Chest pain started this am.  Pt is diaphoric, nausea and vomiting.  Chest pain is on left side, pressure 7-10, reports hx of heart issues.  Dr. Christy Gentles informed of pts condition.

## 2015-08-19 NOTE — Discharge Instructions (Signed)

## 2015-08-21 ENCOUNTER — Ambulatory Visit (INDEPENDENT_AMBULATORY_CARE_PROVIDER_SITE_OTHER): Payer: Medicare Other | Admitting: Family Medicine

## 2015-08-21 ENCOUNTER — Encounter: Payer: Self-pay | Admitting: Family Medicine

## 2015-08-21 VITALS — BP 145/66 | HR 59 | Temp 98.6°F | Ht 61.0 in | Wt 148.0 lb

## 2015-08-21 DIAGNOSIS — F411 Generalized anxiety disorder: Secondary | ICD-10-CM

## 2015-08-21 DIAGNOSIS — R1115 Cyclical vomiting syndrome unrelated to migraine: Secondary | ICD-10-CM

## 2015-08-21 DIAGNOSIS — F1220 Cannabis dependence, uncomplicated: Secondary | ICD-10-CM | POA: Diagnosis not present

## 2015-08-21 DIAGNOSIS — I10 Essential (primary) hypertension: Secondary | ICD-10-CM

## 2015-08-21 DIAGNOSIS — F1290 Cannabis use, unspecified, uncomplicated: Secondary | ICD-10-CM

## 2015-08-21 DIAGNOSIS — K209 Esophagitis, unspecified without bleeding: Secondary | ICD-10-CM

## 2015-08-21 DIAGNOSIS — Z7189 Other specified counseling: Secondary | ICD-10-CM

## 2015-08-21 DIAGNOSIS — G8929 Other chronic pain: Secondary | ICD-10-CM

## 2015-08-21 DIAGNOSIS — G43A1 Cyclical vomiting, intractable: Secondary | ICD-10-CM

## 2015-08-21 DIAGNOSIS — K3184 Gastroparesis: Secondary | ICD-10-CM | POA: Insufficient documentation

## 2015-08-21 MED ORDER — LORAZEPAM 1 MG PO TABS: 1.0000 mg | ORAL_TABLET | Freq: Two times a day (BID) | ORAL | Status: DC | PRN

## 2015-08-21 MED ORDER — PROMETHAZINE HCL 12.5 MG PO TABS: 12.5000 mg | ORAL_TABLET | Freq: Four times a day (QID) | ORAL | Status: DC | PRN

## 2015-08-21 MED ORDER — PREGABALIN 25 MG PO CAPS: ORAL_CAPSULE | ORAL | Status: DC

## 2015-08-21 NOTE — Progress Notes (Signed)
  Patient name: [name] [name] MRN [id number]  Date of birth: [birth year]  CC & HPI:  [name] [name] is a 49 y.o. female presenting today for follow-up of cyclic vomiting syndrome.  She reports persistent intermittent symptoms of nausea and vomiting episodes as well as epigastric abdominal pain.  She was seen in the ED since last office visit and treated for an episode.  He denies any vomiting in the past 24 hours.  Denies any fevers, chills, diarrhea, bloody stool.  She has not had any pain medicine several weeks.  She has been out of Ativan for 1 day.  She reports not seeing a gastroenterologist for several years, and declines knowing that she had gastroparesis.  She continues to smoke marijuana, last used several days ago.  She reports PPIs make her vomiting worse.  Was seen by Dr. Joelyn Oms at [facility] and started on new blood pressure medication, which she has yet to fill.  She is in the process of moving back to [place] from [place] reports difficulty making it to appointments due to this.   ROS: See HPI   Medical & Surgical Hx:  Reviewed  Medications & Allergies: Reviewed  Social History: Reviewed:   Objective Findings:  Vitals: BP 145/66 mmHg  Pulse 59  Temp(Src) 98.6 F (37 C) (Oral)  Ht 5\' 1"  (1.549 m)  Wt 148 lb (67.132 kg)  BMI 27.98 kg/m2  Gen: NAD HEENT: MMM; PERRLA CV: RRR w/o m/r/g, pulses +2 b/l Resp: CTAB w/ normal respiratory effort GI: No skin changes; BS + x 4 quads; mild epigastric tenderness, No CVA tenderness   Assessment & Plan:   Please See Problem Focused Assessment & Plan

## 2015-08-21 NOTE — Assessment & Plan Note (Signed)
Recommended PPI use; she declines at this time stating all PPIs make her nauseous.  Discussed consequences of chronic vomiting and esophagitis and red flags for return

## 2015-08-21 NOTE — Assessment & Plan Note (Signed)
Discussed marijuana use in relation to cyclic vomiting syndrome-recommended quitting

## 2015-08-21 NOTE — Assessment & Plan Note (Signed)
Noted on previous gastric emptying study 2012; likely due to combination of chronic opiate use and diabetes - Rereferred back to gastroenterology for follow-up; she wishes to see [facility] and does not want to go back to [employer]

## 2015-08-21 NOTE — Assessment & Plan Note (Addendum)
Long discussion again today about cyclic vomiting and relation to [facility] use, as well as opiate use.  - Recommended against smoking marijuana - Discontinued opiates today. She has been without them for several weeks due to no showing her last appointment - Refilled Ativan # 30; when necessary use 1-2 times daily for anxiety; recommended weaning this medication as tolerated as I do not plan to continue long-term - Referred back to gastroenterology for reassessment of gastroparesis - Again discussed amitriptyline for migraine prevention/anxiety, depression, chronic pain - deferred at this time due to making other changes - Refilled Lyrica - Highly recommended PPI due to chronic vomiting; she declined stating that all PPIs make her more nauseous - Ideally Ativan will last her one month.  However, I would be willing to call in 1 Prescription for her this month only as she is currently in the process of moving back to [place] from [place]

## 2015-08-21 NOTE — Assessment & Plan Note (Signed)
BP slightly elevated.  She's been holding her lisinopril for vomiting episodes.  Reports being started on new medication by kidney doctor; however she has not filled this medication yet - Advised her to contact her kidney doctor for follow-up after she starts this medication

## 2015-08-21 NOTE — Patient Instructions (Signed)
It was great seeing you today.   1. I referred you to a gastroenterologist for follow-up of your gastroparesis and cyclic vomiting.  2. I encourage you to stop smoking marijuana as this is making your cyclic vomiting worse 3. I have refilled your Ativan.  I encourage you to use this sparingly.  1-2 times a day as needed for anxiety.    Please bring all your medications to every doctors visit  Sign up for My Chart to have easy access to your labs results, and communication with your Primary care physician.  Next Appointment  Please make an appointment with Dr Berkley Harvey in 2-4 weeks for cyclic vomiting   I look forward to talking with you again at our next visit. If you have any questions or concerns before then, please call the clinic at [phone].  Take Care,   Dr Phill Myron

## 2015-09-26 ENCOUNTER — Encounter (HOSPITAL_COMMUNITY): Payer: Self-pay | Admitting: Emergency Medicine

## 2015-09-26 ENCOUNTER — Emergency Department (HOSPITAL_COMMUNITY): Payer: Medicare Other

## 2015-09-26 ENCOUNTER — Encounter (HOSPITAL_COMMUNITY): Payer: Self-pay

## 2015-09-26 ENCOUNTER — Emergency Department (HOSPITAL_COMMUNITY)
Admission: EM | Admit: 2015-09-26 | Discharge: 2015-09-27 | Disposition: A | Discharge status: Home / Self Care | Payer: Medicare Other | Source: Home / Self Care | Attending: Emergency Medicine | Admitting: Emergency Medicine

## 2015-09-26 ENCOUNTER — Emergency Department (HOSPITAL_COMMUNITY)
Admission: EM | Admit: 2015-09-26 | Discharge: 2015-09-26 | Disposition: A | Discharge status: Home / Self Care | Payer: Medicare Other | Attending: Emergency Medicine | Admitting: Emergency Medicine

## 2015-09-26 DIAGNOSIS — I10 Essential (primary) hypertension: Secondary | ICD-10-CM | POA: Diagnosis not present

## 2015-09-26 DIAGNOSIS — R109 Unspecified abdominal pain: Secondary | ICD-10-CM | POA: Diagnosis not present

## 2015-09-26 DIAGNOSIS — Z23 Encounter for immunization: Secondary | ICD-10-CM | POA: Diagnosis not present

## 2015-09-26 DIAGNOSIS — B9689 Other specified bacterial agents as the cause of diseases classified elsewhere: Secondary | ICD-10-CM | POA: Diagnosis not present

## 2015-09-26 DIAGNOSIS — R1115 Cyclical vomiting syndrome unrelated to migraine: Secondary | ICD-10-CM

## 2015-09-26 DIAGNOSIS — G43A0 Cyclical vomiting, not intractable: Secondary | ICD-10-CM | POA: Diagnosis not present

## 2015-09-26 DIAGNOSIS — I129 Hypertensive chronic kidney disease with stage 1 through stage 4 chronic kidney disease, or unspecified chronic kidney disease: Secondary | ICD-10-CM | POA: Diagnosis not present

## 2015-09-26 DIAGNOSIS — G43A1 Cyclical vomiting, intractable: Secondary | ICD-10-CM | POA: Diagnosis not present

## 2015-09-26 DIAGNOSIS — N760 Acute vaginitis: Secondary | ICD-10-CM | POA: Diagnosis not present

## 2015-09-26 DIAGNOSIS — M25552 Pain in left hip: Secondary | ICD-10-CM | POA: Diagnosis not present

## 2015-09-26 DIAGNOSIS — R1013 Epigastric pain: Secondary | ICD-10-CM | POA: Diagnosis not present

## 2015-09-26 DIAGNOSIS — R110 Nausea: Secondary | ICD-10-CM | POA: Diagnosis not present

## 2015-09-26 DIAGNOSIS — R1110 Vomiting, unspecified: Secondary | ICD-10-CM | POA: Diagnosis not present

## 2015-09-26 DIAGNOSIS — E119 Type 2 diabetes mellitus without complications: Secondary | ICD-10-CM | POA: Diagnosis not present

## 2015-09-26 DIAGNOSIS — N183 Chronic kidney disease, stage 3 (moderate): Secondary | ICD-10-CM | POA: Diagnosis not present

## 2015-09-26 DIAGNOSIS — K920 Hematemesis: Secondary | ICD-10-CM | POA: Diagnosis not present

## 2015-09-26 LAB — URINALYSIS, ROUTINE W REFLEX MICROSCOPIC
Bilirubin Urine: NEGATIVE
Glucose, UA: 250 mg/dL — AB
KETONES UR: NEGATIVE mg/dL
LEUKOCYTES UA: NEGATIVE
NITRITE: NEGATIVE
PH: 5 (ref 5.0–8.0)
Protein, ur: 30 mg/dL — AB
SPECIFIC GRAVITY, URINE: 1.012 (ref 1.005–1.030)
Urobilinogen, UA: 0.2 mg/dL (ref 0.0–1.0)

## 2015-09-26 LAB — CBC WITH DIFFERENTIAL/PLATELET
BASOS ABS: 0 10*3/uL (ref 0.0–0.1)
BASOS PCT: 0 %
Basophils Absolute: 0 10*3/uL (ref 0.0–0.1)
Basophils Relative: 0 %
EOS ABS: 0.1 10*3/uL (ref 0.0–0.7)
EOS ABS: 0.1 10*3/uL (ref 0.0–0.7)
EOS PCT: 2 %
EOS PCT: 1 %
HCT: 34.5 % — ABNORMAL LOW (ref 36.0–46.0)
HCT: 32.8 % — ABNORMAL LOW (ref 36.0–46.0)
HEMOGLOBIN: 11.5 g/dL — AB (ref 12.0–15.0)
Hemoglobin: 11.8 g/dL — ABNORMAL LOW (ref 12.0–15.0)
Lymphocytes Relative: 47 %
Lymphocytes Relative: 41 %
Lymphs Abs: 2.5 10*3/uL (ref 0.7–4.0)
Lymphs Abs: 2.9 10*3/uL (ref 0.7–4.0)
MCH: 30.9 pg (ref 26.0–34.0)
MCH: 31.6 pg (ref 26.0–34.0)
MCHC: 34.2 g/dL (ref 30.0–36.0)
MCHC: 35.1 g/dL (ref 30.0–36.0)
MCV: 90.3 fL (ref 78.0–100.0)
MCV: 90.1 fL (ref 78.0–100.0)
MONOS PCT: 9 %
Monocytes Absolute: 0.4 10*3/uL (ref 0.1–1.0)
Monocytes Absolute: 0.6 10*3/uL (ref 0.1–1.0)
Monocytes Relative: 7 %
NEUTROS PCT: 49 %
Neutro Abs: 2.3 10*3/uL (ref 1.7–7.7)
Neutro Abs: 3.4 10*3/uL (ref 1.7–7.7)
Neutrophils Relative %: 44 %
PLATELETS: 192 10*3/uL (ref 150–400)
Platelets: 222 10*3/uL (ref 150–400)
RBC: 3.82 MIL/uL — AB (ref 3.87–5.11)
RBC: 3.64 MIL/uL — ABNORMAL LOW (ref 3.87–5.11)
RDW: 13.8 % (ref 11.5–15.5)
RDW: 13.6 % (ref 11.5–15.5)
WBC: 5.3 10*3/uL (ref 4.0–10.5)
WBC: 6.9 10*3/uL (ref 4.0–10.5)

## 2015-09-26 LAB — LACTIC ACID, PLASMA: Lactic Acid, Venous: 1.1 mmol/L (ref 0.5–2.0)

## 2015-09-26 LAB — COMPREHENSIVE METABOLIC PANEL
ALBUMIN: 4.1 g/dL (ref 3.5–5.0)
ALT: 13 U/L — AB (ref 14–54)
ALT: 24 U/L (ref 14–54)
AST: 17 U/L (ref 15–41)
AST: 34 U/L (ref 15–41)
Albumin: 4.7 g/dL (ref 3.5–5.0)
Alkaline Phosphatase: 75 U/L (ref 38–126)
Alkaline Phosphatase: 81 U/L (ref 38–126)
Anion gap: 11 (ref 5–15)
Anion gap: 12 (ref 5–15)
BILIRUBIN TOTAL: 0.7 mg/dL (ref 0.3–1.2)
BUN: 28 mg/dL — AB (ref 6–20)
BUN: 27 mg/dL — AB (ref 6–20)
CALCIUM: 9.8 mg/dL (ref 8.9–10.3)
CHLORIDE: 110 mmol/L (ref 101–111)
CO2: 19 mmol/L — AB (ref 22–32)
CO2: 21 mmol/L — ABNORMAL LOW (ref 22–32)
CREATININE: 2.16 mg/dL — AB (ref 0.44–1.00)
CREATININE: 2.09 mg/dL — AB (ref 0.44–1.00)
Calcium: 9.9 mg/dL (ref 8.9–10.3)
Chloride: 109 mmol/L (ref 101–111)
GFR calc Af Amer: 29 mL/min — ABNORMAL LOW (ref >60)
GFR calc Af Amer: 31 mL/min — ABNORMAL LOW (ref >60)
GFR calc non Af Amer: 25 mL/min — ABNORMAL LOW (ref >60)
GFR, EST NON AFRICAN AMERICAN: 26 mL/min — AB (ref >60)
Glucose, Bld: 245 mg/dL — ABNORMAL HIGH (ref 65–99)
Glucose, Bld: 233 mg/dL — ABNORMAL HIGH (ref 65–99)
Potassium: 4.6 mmol/L (ref 3.5–5.1)
Potassium: 3.4 mmol/L — ABNORMAL LOW (ref 3.5–5.1)
SODIUM: 140 mmol/L (ref 135–145)
Sodium: 142 mmol/L (ref 135–145)
TOTAL PROTEIN: 8 g/dL (ref 6.5–8.1)
Total Bilirubin: 0.6 mg/dL (ref 0.3–1.2)
Total Protein: 7 g/dL (ref 6.5–8.1)

## 2015-09-26 LAB — LIPASE, BLOOD
LIPASE: 33 U/L (ref 22–51)
LIPASE: 24 U/L (ref 22–51)

## 2015-09-26 LAB — WET PREP, GENITAL
TRICH WET PREP: NONE SEEN
WBC, Wet Prep HPF POC: NONE SEEN
Yeast Wet Prep HPF POC: NONE SEEN

## 2015-09-26 LAB — URINE MICROSCOPIC-ADD ON

## 2015-09-26 MED ORDER — HYDROMORPHONE HCL 1 MG/ML IJ SOLN
1.0000 mg | Freq: Once | INTRAMUSCULAR | Status: AC
  Administered 2015-09-26: 1 mg via INTRAVENOUS
  Filled 2015-09-26: qty 1

## 2015-09-26 MED ORDER — SODIUM CHLORIDE 0.9 % IV BOLUS (SEPSIS)
1000.0000 mL | Freq: Once | INTRAVENOUS | Status: AC
  Administered 2015-09-26: 1000 mL via INTRAVENOUS

## 2015-09-26 MED ORDER — DIAZEPAM 5 MG PO TABS: 5.0000 mg | ORAL_TABLET | Freq: Two times a day (BID) | ORAL | Status: DC

## 2015-09-26 MED ORDER — HYDROMORPHONE HCL 1 MG/ML IJ SOLN: 0.5000 mg | Freq: Once | INTRAMUSCULAR | Status: DC

## 2015-09-26 MED ORDER — LORAZEPAM 2 MG/ML IJ SOLN
1.0000 mg | Freq: Once | INTRAMUSCULAR | Status: AC
  Administered 2015-09-26: 1 mg via INTRAVENOUS
  Filled 2015-09-26: qty 1

## 2015-09-26 MED ORDER — METRONIDAZOLE 500 MG PO TABS: 500.0000 mg | ORAL_TABLET | Freq: Two times a day (BID) | ORAL | Status: DC

## 2015-09-26 MED ORDER — METOCLOPRAMIDE HCL 5 MG/ML IJ SOLN
10.0000 mg | Freq: Once | INTRAMUSCULAR | Status: AC
  Administered 2015-09-26: 10 mg via INTRAVENOUS
  Filled 2015-09-26: qty 2

## 2015-09-26 MED ORDER — MORPHINE SULFATE (PF) 4 MG/ML IV SOLN
4.0000 mg | Freq: Once | INTRAVENOUS | Status: DC
  Filled 2015-09-26: qty 1

## 2015-09-26 MED ORDER — LORAZEPAM 1 MG PO TABS
1.0000 mg | ORAL_TABLET | Freq: Once | ORAL | Status: AC
  Administered 2015-09-26: 1 mg via ORAL
  Filled 2015-09-26: qty 1

## 2015-09-26 NOTE — ED Provider Notes (Signed)
CSN: [id number]     Arrival date & time 09/26/15  2156 History   First MD Initiated Contact with Patient 09/26/15 2216     Chief Complaint  Patient presents with  . Emesis     (Consider location/radiation/quality/duration/timing/severity/associated sxs/prior Treatment) HPI [name] [name] is a 49 y.o. female with hx of pancreatitis, htn, cyclic vomiting syndrome, GERD, gastroparesis, CKD stage III, presents to emergency department complaining of nausea and vomiting. Patient states symptoms began a week ago. She was seen at [facility] emergency department this morning for the same complaint. States they gave her Ativan and she felt better. She went home with Valium. She states she is unable to keep anything down since getting home. Patient is followed by family practice for her sickle call syndrome. She was last seen a month ago. They thought her symptoms could be related to diabetic gastroparesis versus THC use. Patient is currently being treated with Ativan for her symptoms. Patient continues to smoke marijuana daily, but states she has not had it in the loss several days because of being sick. She states she is unable to keep any medications down. She states now her emesis is pink. She denies changes in her stools. She denies any fever or chills. She states she is having abdominal pain in the upper abdomen. Denies any urinary symptoms. No back pain.  Past Medical History  Diagnosis Date  . PANCREATITIS 05/09/2008  . Cyclic vomiting syndrome   . Hypertension   . Complication of anesthesia     "problems waking up"  . Heart murmur   . Myocardial infarction 07/2007    "they say I've had a silent one; I don't know"  . Pneumonia   . Anemia   . Blood transfusion   . GERD (gastroesophageal reflux disease)   . Anxiety   . Depression   . Smoker   . Marijuana smoker, continuous   . NEUROPATHY, DIABETIC 02/23/2007  . Type II diabetes mellitus   . Diabetic gastroparesis     /e-chart  . Chronic  kidney disease (CKD), stage III (moderate)   . Nausea & vomiting 06/10/2015  . Intestinal impaction    Past Surgical History  Procedure Laterality Date  . Port-a-cath placement  ~ 2008    right chest; "poor access; frequent hospitalizations"  . Laparotomy and lysis of adhesions    . Abdominal hysterectomy  02/2002  . Cholecystectomy  1980's  . Cardiac catheterization    . Dilation and evacuation  X3  . Left hand surgery    . Cataract extraction w/ intraocular lens  implant, bilateral    . Colonoscopy    . Upper gi endoscopy    . Cesarean section     Family History  Problem Relation Age of Onset  . Diabetes type II Mother   . Diabetes Mother   . Hypertension Mother   . Diabetes type II Sister   . Diabetes Sister   . Hypertension Sister   . Hypertension Brother    Social History  Substance Use Topics  . Smoking status: Current Some Day Smoker -- 0.50 packs/day for 22 years    Types: Cigarettes  . Smokeless tobacco: Never Used  . Alcohol Use: No   OB History    Gravida Para Term Preterm AB TAB SAB Ectopic Multiple Living   '7    3 3    4     ' Review of Systems  Constitutional: Negative for fever and chills.  Respiratory: Negative for cough,  chest tightness and shortness of breath.   Cardiovascular: Negative for chest pain, palpitations and leg swelling.  Gastrointestinal: Positive for nausea, vomiting and abdominal pain. Negative for diarrhea.  Genitourinary: Negative for dysuria, flank pain and pelvic pain.  Musculoskeletal: Negative for myalgias, arthralgias, neck pain and neck stiffness.  Skin: Negative for rash.  Neurological: Negative for dizziness, weakness and headaches.  All other systems reviewed and are negative.     Allergies  Bee venom; Acetaminophen; Novolog; Other; Tegaderm ag mesh; and Erythromycin  Home Medications   Prior to Admission medications   Medication Sig Start Date End Date Taking? Authorizing Provider  carvedilol (COREG) 25 MG tablet  Take 1 tablet (25 mg total) by mouth 2 (two) times daily with a meal. 06/02/15  Yes Hilton Sinclair, MD  diazepam (VALIUM) 5 MG tablet Take 1 tablet (5 mg total) by mouth 2 (two) times daily. 09/26/15  Yes Antony Blackbird, MD  lisinopril (PRINIVIL,ZESTRIL) 40 MG tablet Take 1 tablet (40 mg total) by mouth daily. 08/13/15  Yes Olam Idler, MD  metroNIDAZOLE (FLAGYL) 500 MG tablet Take 1 tablet (500 mg total) by mouth 2 (two) times daily. Patient taking differently: Take 500 mg by mouth 2 (two) times daily. For 14 days 09/26/15  Yes Antony Blackbird, MD  pregabalin (LYRICA) 25 MG capsule TAKE 1 TABLET BY MOUTH AT BEDTIME AS NEEDED 08/21/15  Yes Olam Idler, MD  aspirin EC 81 MG tablet Take 1 tablet (81 mg total) by mouth daily. 06/02/15   Hilton Sinclair, MD  blood glucose meter kit and supplies KIT Dispense based on patient and insurance preference. Use up to four times daily as directed. (FOR ICD-9 250.00, 250.01). 06/02/15   Hilton Sinclair, MD  cetirizine (ZYRTEC) 10 MG tablet Take 1 tablet (10 mg total) by mouth daily. Patient not taking: Reported on 08/03/2015 06/20/15   Hilton Sinclair, MD  hydrALAZINE (APRESOLINE) 10 MG tablet Take 1 tablet (10 mg total) by mouth 3 (three) times daily. 06/02/15   Hilton Sinclair, MD  insulin glargine (LANTUS) 100 UNIT/ML injection Inject 0.22 mLs (22 Units total) into the skin daily. 06/02/15   Hilton Sinclair, MD  LORazepam (ATIVAN) 1 MG tablet Take 1 tablet (1 mg total) by mouth 2 (two) times daily as needed (cyclic vomiting symptoms). 08/21/15   Olam Idler, MD  oxyCODONE (OXY IR/ROXICODONE) 5 MG immediate release tablet Take one tab by mouth up to twice a day as needed for severe pain 07/18/15   Olam Idler, MD  polyethylene glycol [facility] / Floria Raveling) packet Take 17 g by mouth daily as needed. May increase in 2-3 days to 2 times daily, Until stooling regularly Patient not taking: Reported on 08/03/2015 06/02/15   Hilton Sinclair, MD   promethazine (PHENERGAN) 12.5 MG tablet Take 1 tablet (12.5 mg total) by mouth every 6 (six) hours as needed for nausea or vomiting. 08/21/15   Olam Idler, MD  promethazine (PHENERGAN) 25 MG suppository Place 25 mg rectally every 6 (six) hours as needed for nausea or vomiting.    Historical Provider, MD   BP 192/113 mmHg  Pulse 137  Temp(Src) 97.4 F (36.3 C) (Axillary)  Resp 21  SpO2 97% Physical Exam  Constitutional: She is oriented to person, place, and time. She appears well-developed and well-nourished. No distress.  HENT:  Head: Normocephalic.  Eyes: Conjunctivae are normal.  Neck: Neck supple.  Cardiovascular: Normal rate, regular rhythm and normal heart sounds.  Pulmonary/Chest: Effort normal and breath sounds normal. No respiratory distress. She has no wheezes. She has no rales.  Abdominal: Soft. Bowel sounds are normal. She exhibits no distension. There is tenderness. There is no rebound.  Diffuse tenderness  Musculoskeletal: She exhibits no edema.  Neurological: She is alert and oriented to person, place, and time.  Skin: Skin is warm and dry.  Psychiatric: She has a normal mood and affect. Her behavior is normal.  Nursing note and vitals reviewed.   ED Course  Procedures (including critical care time) Labs Review Labs Reviewed  CBC WITH DIFFERENTIAL/PLATELET - Abnormal; Notable for the following:    RBC 3.64 (*)    Hemoglobin 11.5 (*)    HCT 32.8 (*)    All other components within normal limits  COMPREHENSIVE METABOLIC PANEL - Abnormal; Notable for the following:    Potassium 3.4 (*)    CO2 21 (*)    Glucose, Bld 233 (*)    BUN 27 (*)    Creatinine, Ser 2.09 (*)    GFR calc non Af Amer 26 (*)    GFR calc Af Amer 31 (*)    All other components within normal limits  LIPASE, BLOOD    Imaging Review Dg Hip Unilat With Pelvis 2-3 Views Left  09/26/2015   CLINICAL DATA:  Left hip popping sensation and pain for 1 week  EXAM: DG HIP (WITH OR WITHOUT PELVIS)  2-3V LEFT  COMPARISON:  None.  FINDINGS: There is no evidence of hip fracture or dislocation. There is no evidence of arthropathy or other focal bone abnormality.  IMPRESSION: Negative.   Electronically Signed   By: Conchita Paris M.D.   On: 09/26/2015 09:15   I have personally reviewed and evaluated these images and lab results as part of my medical decision-making.   EKG Interpretation None      MDM   Final diagnoses:  Non-intractable cyclical vomiting with nausea    Pt in ED with complaint of nausea, vomiting, abdominal pain, similar to prior cyclical vomiting episodes. She was just treated this morning. Will recheck labs, IV fluids, patient states Zofran, Reglan, morphine does not work and make her symptoms worse. Will try Ativan.  Pt feeling better. Tolerating POs. Symptoms consistent with her chronic cyclical syndrome. Will dc home with phenergan suppositories.   Filed Vitals:   09/26/15 2340 09/27/15 0000 09/27/15 0030 09/27/15 0154  BP: 145/86 114/67 153/80 154/86  Pulse: 105 94 85 90  Temp:      TempSrc:      Resp: 18   18  SpO2: 98% 100% 100% 97%       Jeannett Senior, PA-C 09/27/15 2028  Jola Schmidt, MD 09/29/15 [phone]

## 2015-09-26 NOTE — ED Provider Notes (Signed)
CSN: [id number]     Arrival date & time 09/26/15  0730 History   First MD Initiated Contact with Patient 09/26/15 0802     Chief Complaint  Patient presents with  . Abdominal Pain  . Nausea  . Hip Pain   [name] [name] is a 49 y.o. female with a past medical history significant for hypertension, diabetes, GERD, chronic kidney disease, MI and cyclical vomiting syndrome who presents with nausea, vomiting, abdominal pain vaginal discharge and left hip pain. The patient reports that she last saw her primary physician 1 month ago and this Monday, ran out of her prescribed Ativan. The patient reports that she is unable to keep her nausea and vomiting under control for the last month however after running out of her symptoms have returned. The patient describes having nausea and vomiting and abdominal pain in the epigastric area every morning this week after waking up. The patient also describes left hip pain which she began feeling after moving a couch up stairs and feeling an abrupt onset of pain in her left lateral hip. The patient denies any numbness tingling or weakness in her legs and denies any loss of bowel or bladder function. The patient reports she has had a white vaginal discharge is bothering her for several weeks and would like to have that investigated.  (Consider location/radiation/quality/duration/timing/severity/associated sxs/prior Treatment) Patient is a 49 y.o. female presenting with abdominal pain and hip pain. The history is provided by the patient and medical records.  Abdominal Pain Pain location:  Epigastric Pain quality: aching   Pain radiates to:  Does not radiate Pain severity:  Severe Onset quality:  Gradual Duration:  1 week Timing:  Sporadic Progression:  Waxing and waning Chronicity:  Chronic Context: not trauma   Relieved by:  Nothing Worsened by:  Nothing tried Ineffective treatments:  Vomiting Associated symptoms: nausea, vaginal discharge and vomiting     Associated symptoms: no chest pain, no chills, no constipation, no cough, no diarrhea, no dysuria, no fatigue, no fever, no shortness of breath and no vaginal bleeding   Nausea:    Severity:  Severe   Onset quality:  Gradual   Duration:  1 week   Timing:  Sporadic   Progression:  Waxing and waning Vomiting:    Quality:  Stomach contents   Number of occurrences:  Around 2 times a day   Severity:  Severe   Duration:  1 week   Timing:  Sporadic   Progression:  Unchanged Hip Pain This is a new problem. The current episode started in the past 7 days (while moving a couch up stairs). The problem occurs constantly. The problem has been unchanged. Associated symptoms include abdominal pain, nausea and vomiting. Pertinent negatives include no chest pain, chills, congestion, coughing, fatigue, fever, headaches, neck pain or rash. The symptoms are aggravated by bending. She has tried nothing for the symptoms. The treatment provided no relief.    Past Medical History  Diagnosis Date  . PANCREATITIS 05/09/2008  . Cyclic vomiting syndrome   . Hypertension   . Complication of anesthesia     "problems waking up"  . Heart murmur   . Myocardial infarction 07/2007    "they say I've had a silent one; I don't know"  . Pneumonia   . Anemia   . Blood transfusion   . GERD (gastroesophageal reflux disease)   . Anxiety   . Depression   . Smoker   . Marijuana smoker, continuous   . NEUROPATHY,  DIABETIC 02/23/2007  . Type II diabetes mellitus   . Diabetic gastroparesis     /e-chart  . Chronic kidney disease (CKD), stage III (moderate)   . Nausea & vomiting 06/10/2015  . Intestinal impaction    Past Surgical History  Procedure Laterality Date  . Port-a-cath placement  ~ 2008    right chest; "poor access; frequent hospitalizations"  . Laparotomy and lysis of adhesions    . Abdominal hysterectomy  02/2002  . Cholecystectomy  1980's  . Cardiac catheterization    . Dilation and evacuation  X3  .  Left hand surgery    . Cataract extraction w/ intraocular lens  implant, bilateral    . Colonoscopy    . Upper gi endoscopy    . Cesarean section     Family History  Problem Relation Age of Onset  . Diabetes type II Mother   . Diabetes Mother   . Hypertension Mother   . Diabetes type II Sister   . Diabetes Sister   . Hypertension Sister   . Hypertension Brother    Social History  Substance Use Topics  . Smoking status: Current Some Day Smoker -- 0.50 packs/day for 22 years    Types: Cigarettes  . Smokeless tobacco: Never Used  . Alcohol Use: No   OB History    Gravida Para Term Preterm AB TAB SAB Ectopic Multiple Living   '7    3 3    4     ' Review of Systems  Constitutional: Negative for fever, chills and fatigue.  HENT: Negative for congestion and rhinorrhea.   Respiratory: Negative for cough, chest tightness, shortness of breath, wheezing and stridor.   Cardiovascular: Negative for chest pain.  Gastrointestinal: Positive for nausea, vomiting and abdominal pain. Negative for diarrhea, constipation and abdominal distention.  Genitourinary: Positive for vaginal discharge. Negative for dysuria, frequency, flank pain, decreased urine volume and vaginal bleeding.  Musculoskeletal: Negative for back pain, neck pain and neck stiffness.  Skin: Negative for rash and wound.  Neurological: Negative for dizziness and headaches.  All other systems reviewed and are negative.     Allergies  Bee venom; Acetaminophen; Novolog; Other; Tegaderm ag mesh; and Erythromycin  Home Medications   Prior to Admission medications   Medication Sig Start Date End Date Taking? Authorizing Provider  aspirin EC 81 MG tablet Take 1 tablet (81 mg total) by mouth daily. 06/02/15   [name] Sinclair, MD  blood glucose meter kit and supplies KIT Dispense based on patient and insurance preference. Use up to four times daily as directed. (FOR ICD-9 250.00, 250.01). 06/02/15   [name] Sinclair, MD   carvedilol (COREG) 25 MG tablet Take 1 tablet (25 mg total) by mouth 2 (two) times daily with a meal. 06/02/15   [name] Sinclair, MD  cetirizine (ZYRTEC) 10 MG tablet Take 1 tablet (10 mg total) by mouth daily. Patient not taking: Reported on 08/03/2015 06/20/15   [name] Sinclair, MD  diazepam (VALIUM) 5 MG tablet Take 1 tablet (5 mg total) by mouth 2 (two) times daily. 09/26/15   [name] Blackbird, MD  hydrALAZINE (APRESOLINE) 10 MG tablet Take 1 tablet (10 mg total) by mouth 3 (three) times daily. 06/02/15   [name] Sinclair, MD  insulin glargine (LANTUS) 100 UNIT/ML injection Inject 0.22 mLs (22 Units total) into the skin daily. 06/02/15   [name] Sinclair, MD  lisinopril (PRINIVIL,ZESTRIL) 40 MG tablet Take 1 tablet (40 mg total) by mouth daily. 08/13/15  [name] Idler, MD  LORazepam (ATIVAN) 1 MG tablet Take 1 tablet (1 mg total) by mouth 2 (two) times daily as needed (cyclic vomiting symptoms). 08/21/15   [name] Idler, MD  metroNIDAZOLE (FLAGYL) 500 MG tablet Take 1 tablet (500 mg total) by mouth 2 (two) times daily. 09/26/15   [name] Blackbird, MD  oxyCODONE (OXY IR/ROXICODONE) 5 MG immediate release tablet Take one tab by mouth up to twice a day as needed for severe pain 07/18/15   [name] Idler, MD  polyethylene glycol [facility] / Floria Raveling) packet Take 17 g by mouth daily as needed. May increase in 2-3 days to 2 times daily, Until stooling regularly Patient not taking: Reported on 08/03/2015 06/02/15   [name] Sinclair, MD  pregabalin (LYRICA) 25 MG capsule TAKE 1 TABLET BY MOUTH AT BEDTIME AS NEEDED 08/21/15   [name] Idler, MD  promethazine (PHENERGAN) 12.5 MG tablet Take 1 tablet (12.5 mg total) by mouth every 6 (six) hours as needed for nausea or vomiting. 08/21/15   [name] Idler, MD  promethazine (PHENERGAN) 25 MG suppository Place 25 mg rectally every 6 (six) hours as needed for nausea or vomiting.    Historical Provider, MD   BP 159/78 mmHg  Pulse 65  Temp(Src) 97.5 F (36.4 C)  (Oral)  Resp 16  Ht '5\' 1"'  (1.549 m)  SpO2 100% Physical Exam  Constitutional: She is oriented to person, place, and time. She appears well-developed and well-nourished. No distress.  HENT:  Head: Normocephalic and atraumatic.  Mouth/Throat: No oropharyngeal exudate.  Eyes: Conjunctivae and EOM are normal. Pupils are equal, round, and reactive to light.  Neck: Normal range of motion.  Cardiovascular: Normal rate, regular rhythm, normal heart sounds and intact distal pulses.   No murmur heard. Pulmonary/Chest: Effort normal and breath sounds normal. No stridor. No respiratory distress. She has no wheezes. She exhibits no tenderness.  Abdominal: Soft. Bowel sounds are normal. She exhibits no distension. There is generalized tenderness. There is no rigidity, no rebound and no guarding.  Musculoskeletal: She exhibits tenderness.       Left hip: She exhibits tenderness.       Legs: Neurological: She is alert and oriented to person, place, and time. She exhibits normal muscle tone.  Skin: Skin is warm. She is not diaphoretic. No erythema.  Nursing note and vitals reviewed.   ED Course  Procedures (including critical care time) Labs Review Labs Reviewed  WET PREP, GENITAL - Abnormal; Notable for the following:    Clue Cells Wet Prep HPF POC MODERATE (*)    All other components within normal limits  CBC WITH DIFFERENTIAL/PLATELET - Abnormal; Notable for the following:    RBC 3.82 (*)    Hemoglobin 11.8 (*)    HCT 34.5 (*)    All other components within normal limits  COMPREHENSIVE METABOLIC PANEL - Abnormal; Notable for the following:    CO2 19 (*)    Glucose, Bld 245 (*)    BUN 28 (*)    Creatinine, Ser 2.16 (*)    ALT 13 (*)    GFR calc non Af Amer 25 (*)    GFR calc Af Amer 29 (*)    All other components within normal limits  URINALYSIS, ROUTINE W REFLEX MICROSCOPIC (NOT AT [facility]) - Abnormal; Notable for the following:    Glucose, UA 250 (*)    Hgb urine dipstick TRACE (*)     Protein, ur 30 (*)    All other  components within normal limits  URINE CULTURE  LACTIC ACID, PLASMA  LIPASE, BLOOD  URINE MICROSCOPIC-ADD ON  WET PREP  (BD AFFIRM) ([place])  GC/CHLAMYDIA PROBE AMP ([place]) NOT AT [facility]    Imaging Review Dg Hip Unilat With Pelvis 2-3 Views Left  09/26/2015   CLINICAL DATA:  Left hip popping sensation and pain for 1 week  EXAM: DG HIP (WITH OR WITHOUT PELVIS) 2-3V LEFT  COMPARISON:  None.  FINDINGS: There is no evidence of hip fracture or dislocation. There is no evidence of arthropathy or other focal bone abnormality.  IMPRESSION: Negative.   Electronically Signed   By: Conchita Paris M.D.   On: 09/26/2015 09:15   I have personally reviewed and evaluated these images and lab results as part of my medical decision-making.   EKG Interpretation None      MDM   PHILICIA HEYNE is a 49 y.o. female with a past medical history significant for hypertension, diabetes, GERD, chronic kidney disease, MI and cyclical vomiting syndrome who presents with nausea, vomiting, abdominal pain and left hip pain. Given the patient's report that her nausea and vomiting abdominal pain are "the same as it always is", highly suspect the cyclical vomiting condition as the etiology of her symptoms. The patient reports that she ran out of Valium last week and since that time, she has had her symptoms. The patient says that the Valium helps her.   On exam, patient has mild tenderness to palpation in her epigastric area which she reports is the same as before. The patient does not have any tenderness in her back, any neurological deficits in her lower extremities. The patient does have some tenderness in her left lateral hip area. A pelvic exam was performed by this examiner and a white discharge was appreciated. The patient does not have any cervical motion tenderness or adnexal tenderness. Next  The patient had her symptoms improved with IV pain medicine and her Valium. The  patient's laboratory testing revealed bacterial vaginosis. The patient's kidney function appeared similar to prior and her other labs were grossly unremarkable. Next  The patient reports having improvement in her symptoms following the medicine the menstruation. The patient was given a prescription for Flagyl to treat her BV and will be given several days of Valium for her cyclical vomiting. The patient was instructed to follow-up with her PCP in order to further manage her chronic symptoms. The patient had no other questions, concerns, or complaints and the patient was discharged in good condition.    Final diagnoses:  BV (bacterial vaginosis)  Non-intractable cyclical vomiting with nausea        [name] Blackbird, MD 09/26/15 1716  [name] Pew, MD 09/28/15 [phone]

## 2015-09-26 NOTE — ED Notes (Signed)
Pt requests blood draw from port 

## 2015-09-26 NOTE — ED Notes (Signed)
Pt reports she has cyclic vomiting syndrome. She reports several episodes this week. Pt also reports abd pain and left hip pain. Pt takes ativan for nausea at home but she ran out Monday.

## 2015-09-26 NOTE — ED Notes (Addendum)
Pt from home c/o cyclic vomiting. She was seen at at [facility] today for same. Pt reports she cant keep meds down. Pt vomiting in triage.

## 2015-09-26 NOTE — Discharge Instructions (Signed)
Bacterial Vaginosis Bacterial vaginosis is a vaginal infection that occurs when the normal balance of bacteria in the vagina is disrupted. It results from an overgrowth of certain bacteria. This is the most common vaginal infection in women of childbearing age. Treatment is important to prevent complications, especially in pregnant women, as it can cause a premature delivery. CAUSES  Bacterial vaginosis is caused by an increase in harmful bacteria that are normally present in smaller amounts in the vagina. Several different kinds of bacteria can cause bacterial vaginosis. However, the reason that the condition develops is not fully understood. RISK FACTORS Certain activities or behaviors can put you at an increased risk of developing bacterial vaginosis, including:  Having a new sex partner or multiple sex partners.  Douching.  Using an intrauterine device (IUD) for contraception. Women do not get bacterial vaginosis from toilet seats, bedding, swimming pools, or contact with objects around them. SIGNS AND SYMPTOMS  Some women with bacterial vaginosis have no signs or symptoms. Common symptoms include: 1. Grey vaginal discharge. 2. A fishlike odor with discharge, especially after sexual intercourse. 3. Itching or burning of the vagina and vulva. 4. Burning or pain with urination. DIAGNOSIS  Your health care provider will take a medical history and examine the vagina for signs of bacterial vaginosis. A sample of vaginal fluid may be taken. Your health care provider will look at this sample under a microscope to check for bacteria and abnormal cells. A vaginal pH test may also be done.  TREATMENT  Bacterial vaginosis may be treated with antibiotic medicines. These may be given in the form of a pill or a vaginal cream. A second round of antibiotics may be prescribed if the condition comes back after treatment.  HOME CARE INSTRUCTIONS   Only take over-the-counter or prescription medicines as  directed by your health care provider.  If antibiotic medicine was prescribed, take it as directed. Make sure you finish it even if you start to feel better.  Do not have sex until treatment is completed.  Tell all sexual partners that you have a vaginal infection. They should see their health care provider and be treated if they have problems, such as a mild rash or itching.  Practice safe sex by using condoms and only having one sex partner. SEEK MEDICAL CARE IF:   Your symptoms are not improving after 3 days of treatment.  You have increased discharge or pain.  You have a fever. MAKE SURE YOU:   Understand these instructions.  Will watch your condition.  Will get help right away if you are not doing well or get worse. FOR MORE INFORMATION  Centers for Disease Control and Prevention, Division of STD Prevention: [employer] American Sexual Health Association (ASHA): www.ashastd.org  Document Released: 12/13/2005 Document Revised: 10/03/2013 Document Reviewed: 07/25/2013 [facility] Patient Information 2015 [place], [place]. This information is not intended to replace advice given to you by your health care provider. Make sure you discuss any questions you have with your health care provider.   Cyclic Vomiting Syndrome Cyclic vomiting syndrome is a benign condition in which patients experience bouts or cycles of severe nausea and vomiting that last for hours or even days. The bouts of nausea and vomiting alternate with longer periods of no symptoms and generally good health. Cyclic vomiting syndrome occurs mostly in children, but can affect adults. CAUSES  [facility] has no known cause. Each episode is typically similar to the previous ones. The episodes tend to:   Start at about the  same time of day.  Last the same length of time.  Present the same symptoms at the same level of intensity. Cyclic vomiting syndrome can begin at any age in children and adults. Cyclic vomiting syndrome  usually starts between the ages of 3 and 7 years. In adults, episodes tend to occur less often than they do in children, but they last longer. Furthermore, the events or situations that trigger episodes in adults cannot always be pinpointed as easily as they can in children. There are 4 phases of cyclic vomiting syndrome: 5. Prodrome. The prodrome phase signals that an episode of nausea and vomiting is about to begin. This phase can last from just a few minutes to several hours. This phase is often marked by belly (abdominal) pain. Sometimes taking medicine early in the prodrome phase can stop an episode in progress. However, sometimes there is no warning. A person may simply wake up in the middle of the night or early morning and begin vomiting. 6. Episode. The episode phase consists of:  Severe vomiting.  Nausea.  Gagging (retching). 7. Recovery. The recovery phase begins when the nausea and vomiting stop. Healthy color, appetite, and energy return. 8. Symptom-free interval. The symptom-free interval phase is the period between episodes when no symptoms are present. TRIGGERS Episodes can be triggered by an infection or event. Examples of triggers include:  Infections.  Colds, allergies, sinus problems, and the flu.  Eating certain foods such as chocolate or cheese.  Foods with monosodium glutamate (MSG) or preservatives.  Fast foods.  Pre-packaged foods.  Foods with low nutritional value (junk foods).  Overeating.  Eating just before going to bed.  Hot weather.  Dehydration.  Not enough sleep or poor sleep quality.  Physical exhaustion.  Menstruation.  Motion sickness.  Emotional stress (school or home difficulties).  Excitement or stress. SYMPTOMS  The main symptoms of cyclic vomiting syndrome are:  Severe vomiting.  Nausea.  Gagging (retching). Episodes usually begin at night or the first thing in the morning. Episodes may include vomiting or retching up to  5 or 6 times an hour during the worst of the episode. Episodes usually last anywhere from 1 to 4 days. Episodes can last for up to 10 days. Other symptoms include:  Paleness.  Exhaustion.  Listlessness.  Abdominal pain.  Loose stools or diarrhea. Sometimes the nausea and vomiting are so severe that a person appears to be almost unconscious. Sensitivity to light, headache, fever, dizziness, may also accompany an episode. In addition, the vomiting may cause drooling and excessive thirst. Drinking water usually leads to more vomiting, though the water can dilute the acid in the vomit, making the episode a little less painful. Continuous vomiting can lead to dehydration, which means that the body has lost excessive water and salts. DIAGNOSIS  Cyclic vomiting syndrome is hard to diagnose because there are no clear tests to identify it. A caregiver must diagnose cyclic vomiting syndrome by looking at symptoms and medical history. A caregiver must exclude more common diseases or disorders that can also cause nausea and vomiting. Also, diagnosis takes time because caregivers need to identify a pattern or cycle to the vomiting. TREATMENT  Cyclic vomiting syndrome cannot be cured. Treatment varies, but people with cyclic vomiting syndrome should get plenty of rest and sleep and take medications that prevent, stop, or lessen the vomiting episodes and other symptoms. People whose episodes are frequent and long-lasting may be treated during the symptom-free intervals in an effort to prevent or  ease future episodes. The symptom-free phase is a good time to eliminate anything known to trigger an episode. For example, if episodes are brought on by stress or excitement, this period is the time to find ways to reduce stress and stay calm. If sinus problems or allergies cause episodes, those conditions should be treated. The triggers listed above should be avoided or prevented. Because of the similarities between  migraine and cyclic vomiting syndrome, caregivers treat some people with severe cyclic vomiting syndrome with drugs that are also used for migraine headaches. The drugs are designed to:  Prevent episodes.  Reduce their frequency.  Lessen their severity. HOME CARE INSTRUCTIONS Once a vomiting episode begins, treatment is supportive. It helps to stay in bed and sleep in a dark, quiet room. Severe nausea and vomiting may require hospitalization and intravenous (IV) fluids to prevent dehydration. Relaxing medications (sedatives) may help if the nausea continues. Sometimes, during the prodrome phase, it is possible to stop an episode from happening altogether. Only take over-the-counter or prescription medicines for pain, discomfort or fever as directed by your caregiver. Do not give aspirin to children. During the recovery phase, drinking water and replacing lost electrolytes (salts in the blood) are very important. Electrolytes are salts that the body needs to function well and stay healthy. Symptoms during the recovery phase can vary. Some people find that their appetites return to normal immediately, while others need to begin by drinking clear liquids and then move slowly to solid food. RELATED COMPLICATIONS The severe vomiting that defines cyclic vomiting syndrome is a risk factor for several complications:  Dehydration--Vomiting causes the body to lose water quickly.  Electrolyte imbalance--Vomiting also causes the body to lose the important salts it needs to keep working properly.  Peptic esophagitis--The tube that connects the mouth to the stomach (esophagus) becomes injured from the stomach acid that comes up with the vomit.  Hematemesis--The esophagus becomes irritated and bleeds, so blood mixes with the vomit.  Mallory-Weiss tear--The lower end of the esophagus may tear open or the stomach may bruise from vomiting or retching.  Tooth decay--The acid in the vomit can hurt the teeth by  corroding the tooth enamel. SEEK MEDICAL CARE IF: You have questions or problems. Document Released: 02/21/2002 Document Revised: 03/06/2012 Document Reviewed: 03/22/2011 [facility] Patient Information 2015 [place], [place]. This information is not intended to replace advice given to you by your health care provider. Make sure you discuss any questions you have with your health care provider.

## 2015-09-27 ENCOUNTER — Observation Stay (HOSPITAL_COMMUNITY)
Admission: EM | Admit: 2015-09-27 | Discharge: 2015-09-29 | Discharge status: Against Medical Advice | Payer: Medicare Other | Source: Home / Self Care | Attending: Emergency Medicine | Admitting: Emergency Medicine

## 2015-09-27 ENCOUNTER — Encounter (HOSPITAL_COMMUNITY): Payer: Self-pay | Admitting: Emergency Medicine

## 2015-09-27 ENCOUNTER — Emergency Department (HOSPITAL_COMMUNITY): Payer: Medicare Other

## 2015-09-27 DIAGNOSIS — R1013 Epigastric pain: Secondary | ICD-10-CM | POA: Diagnosis not present

## 2015-09-27 DIAGNOSIS — K920 Hematemesis: Secondary | ICD-10-CM | POA: Diagnosis not present

## 2015-09-27 DIAGNOSIS — I10 Essential (primary) hypertension: Secondary | ICD-10-CM | POA: Diagnosis not present

## 2015-09-27 DIAGNOSIS — R1115 Cyclical vomiting syndrome unrelated to migraine: Secondary | ICD-10-CM

## 2015-09-27 DIAGNOSIS — E119 Type 2 diabetes mellitus without complications: Secondary | ICD-10-CM | POA: Insufficient documentation

## 2015-09-27 DIAGNOSIS — R110 Nausea: Secondary | ICD-10-CM | POA: Diagnosis not present

## 2015-09-27 DIAGNOSIS — G43A0 Cyclical vomiting, not intractable: Secondary | ICD-10-CM | POA: Insufficient documentation

## 2015-09-27 DIAGNOSIS — I129 Hypertensive chronic kidney disease with stage 1 through stage 4 chronic kidney disease, or unspecified chronic kidney disease: Secondary | ICD-10-CM | POA: Insufficient documentation

## 2015-09-27 DIAGNOSIS — R1110 Vomiting, unspecified: Secondary | ICD-10-CM | POA: Diagnosis not present

## 2015-09-27 DIAGNOSIS — N183 Chronic kidney disease, stage 3 (moderate): Secondary | ICD-10-CM | POA: Insufficient documentation

## 2015-09-27 DIAGNOSIS — R109 Unspecified abdominal pain: Secondary | ICD-10-CM | POA: Diagnosis not present

## 2015-09-27 DIAGNOSIS — G43A1 Cyclical vomiting, intractable: Secondary | ICD-10-CM | POA: Diagnosis not present

## 2015-09-27 DIAGNOSIS — Z23 Encounter for immunization: Secondary | ICD-10-CM | POA: Insufficient documentation

## 2015-09-27 LAB — CBC WITH DIFFERENTIAL/PLATELET
BASOS ABS: 0 10*3/uL (ref 0.0–0.1)
Basophils Relative: 0 %
EOS PCT: 1 %
Eosinophils Absolute: 0.1 10*3/uL (ref 0.0–0.7)
HCT: 31.5 % — ABNORMAL LOW (ref 36.0–46.0)
Hemoglobin: 10.8 g/dL — ABNORMAL LOW (ref 12.0–15.0)
LYMPHS PCT: 29 %
Lymphs Abs: 2.2 10*3/uL (ref 0.7–4.0)
MCH: 30.9 pg (ref 26.0–34.0)
MCHC: 34.3 g/dL (ref 30.0–36.0)
MCV: 90.3 fL (ref 78.0–100.0)
MONO ABS: 0.7 10*3/uL (ref 0.1–1.0)
Monocytes Relative: 9 %
Neutro Abs: 4.6 10*3/uL (ref 1.7–7.7)
Neutrophils Relative %: 61 %
PLATELETS: 207 10*3/uL (ref 150–400)
RBC: 3.49 MIL/uL — ABNORMAL LOW (ref 3.87–5.11)
RDW: 13.9 % (ref 11.5–15.5)
WBC: 7.6 10*3/uL (ref 4.0–10.5)

## 2015-09-27 LAB — COMPREHENSIVE METABOLIC PANEL
ALBUMIN: 4.4 g/dL (ref 3.5–5.0)
ALK PHOS: 77 U/L (ref 38–126)
ALT: 20 U/L (ref 14–54)
AST: 29 U/L (ref 15–41)
Anion gap: 11 (ref 5–15)
BILIRUBIN TOTAL: 1.1 mg/dL (ref 0.3–1.2)
BUN: 24 mg/dL — AB (ref 6–20)
CALCIUM: 9.4 mg/dL (ref 8.9–10.3)
CO2: 22 mmol/L (ref 22–32)
Chloride: 107 mmol/L (ref 101–111)
Creatinine, Ser: 1.81 mg/dL — ABNORMAL HIGH (ref 0.44–1.00)
GFR calc Af Amer: 37 mL/min — ABNORMAL LOW (ref >60)
GFR calc non Af Amer: 32 mL/min — ABNORMAL LOW (ref >60)
Glucose, Bld: 209 mg/dL — ABNORMAL HIGH (ref 65–99)
POTASSIUM: 3.3 mmol/L — AB (ref 3.5–5.1)
Sodium: 140 mmol/L (ref 135–145)
TOTAL PROTEIN: 7.6 g/dL (ref 6.5–8.1)

## 2015-09-27 LAB — URINE CULTURE

## 2015-09-27 LAB — CBC
HCT: 34.7 % — ABNORMAL LOW (ref 36.0–46.0)
Hemoglobin: 11.7 g/dL — ABNORMAL LOW (ref 12.0–15.0)
MCH: 30.4 pg (ref 26.0–34.0)
MCHC: 33.7 g/dL (ref 30.0–36.0)
MCV: 90.1 fL (ref 78.0–100.0)
PLATELETS: 184 10*3/uL (ref 150–400)
RBC: 3.85 MIL/uL — ABNORMAL LOW (ref 3.87–5.11)
RDW: 13.8 % (ref 11.5–15.5)
WBC: 9.2 10*3/uL (ref 4.0–10.5)

## 2015-09-27 LAB — CREATININE, SERUM
CREATININE: 1.87 mg/dL — AB (ref 0.44–1.00)
GFR calc Af Amer: 35 mL/min — ABNORMAL LOW (ref >60)
GFR calc non Af Amer: 30 mL/min — ABNORMAL LOW (ref >60)

## 2015-09-27 LAB — LIPASE, BLOOD: Lipase: 27 U/L (ref 22–51)

## 2015-09-27 LAB — GLUCOSE, CAPILLARY: Glucose-Capillary: 240 mg/dL — ABNORMAL HIGH (ref 65–99)

## 2015-09-27 MED ORDER — PANTOPRAZOLE SODIUM 40 MG IV SOLR
40.0000 mg | INTRAVENOUS | Status: DC
  Administered 2015-09-27: 40 mg via INTRAVENOUS
  Filled 2015-09-27: qty 40

## 2015-09-27 MED ORDER — LORAZEPAM 2 MG/ML IJ SOLN
0.5000 mg | INTRAMUSCULAR | Status: DC | PRN
  Administered 2015-09-27 – 2015-09-28 (×2): 0.5 mg via INTRAVENOUS
  Filled 2015-09-27 (×2): qty 1

## 2015-09-27 MED ORDER — HYDRALAZINE HCL 10 MG PO TABS: 10.0000 mg | ORAL_TABLET | Freq: Three times a day (TID) | ORAL | Status: DC

## 2015-09-27 MED ORDER — OXYCODONE HCL 5 MG PO TABS: 5.0000 mg | ORAL_TABLET | ORAL | Status: DC | PRN

## 2015-09-27 MED ORDER — SODIUM CHLORIDE 0.9 % IV BOLUS (SEPSIS)
1000.0000 mL | Freq: Once | INTRAVENOUS | Status: AC
  Administered 2015-09-27: 1000 mL via INTRAVENOUS

## 2015-09-27 MED ORDER — HYDRALAZINE HCL 20 MG/ML IJ SOLN: 5.0000 mg | Freq: Four times a day (QID) | INTRAMUSCULAR | Status: DC | PRN

## 2015-09-27 MED ORDER — LORAZEPAM 2 MG/ML IJ SOLN
1.0000 mg | Freq: Once | INTRAMUSCULAR | Status: AC
  Administered 2015-09-27: 1 mg via INTRAMUSCULAR

## 2015-09-27 MED ORDER — METOPROLOL TARTRATE 1 MG/ML IV SOLN
2.5000 mg | Freq: Four times a day (QID) | INTRAVENOUS | Status: DC
  Filled 2015-09-27: qty 5

## 2015-09-27 MED ORDER — PROMETHAZINE HCL 25 MG/ML IJ SOLN
12.5000 mg | Freq: Once | INTRAMUSCULAR | Status: AC
  Administered 2015-09-27: 12.5 mg via INTRAVENOUS
  Filled 2015-09-27: qty 1

## 2015-09-27 MED ORDER — PANTOPRAZOLE SODIUM 40 MG IV SOLR
40.0000 mg | Freq: Once | INTRAVENOUS | Status: AC
  Administered 2015-09-27: 40 mg via INTRAVENOUS
  Filled 2015-09-27: qty 40

## 2015-09-27 MED ORDER — HEPARIN SOD (PORK) LOCK FLUSH 100 UNIT/ML IV SOLN
500.0000 [IU] | Freq: Once | INTRAVENOUS | Status: AC
  Administered 2015-09-27: 500 [IU]
  Filled 2015-09-27: qty 5

## 2015-09-27 MED ORDER — METRONIDAZOLE 500 MG PO TABS: 500.0000 mg | ORAL_TABLET | Freq: Two times a day (BID) | ORAL | Status: DC

## 2015-09-27 MED ORDER — SODIUM CHLORIDE 0.9 % IV SOLN: INTRAVENOUS | Status: DC

## 2015-09-27 MED ORDER — PROMETHAZINE HCL 25 MG RE SUPP: 25.0000 mg | Freq: Four times a day (QID) | RECTAL | Status: DC | PRN

## 2015-09-27 MED ORDER — METOPROLOL TARTRATE 1 MG/ML IV SOLN: 2.5000 mg | Freq: Four times a day (QID) | INTRAVENOUS | Status: DC

## 2015-09-27 MED ORDER — HYDROMORPHONE HCL 1 MG/ML IJ SOLN
0.5000 mg | INTRAMUSCULAR | Status: DC | PRN
  Administered 2015-09-28: 0.5 mg via INTRAVENOUS
  Filled 2015-09-27: qty 1

## 2015-09-27 MED ORDER — PREGABALIN 25 MG PO CAPS: 25.0000 mg | ORAL_CAPSULE | Freq: Every day | ORAL | Status: DC

## 2015-09-27 MED ORDER — INSULIN ASPART 100 UNIT/ML ~~LOC~~ SOLN: 0.0000 [IU] | Freq: Three times a day (TID) | SUBCUTANEOUS | Status: DC

## 2015-09-27 MED ORDER — METOCLOPRAMIDE HCL 5 MG/ML IJ SOLN
10.0000 mg | Freq: Once | INTRAMUSCULAR | Status: AC
  Administered 2015-09-27: 10 mg via INTRAVENOUS
  Filled 2015-09-27: qty 2

## 2015-09-27 MED ORDER — ENOXAPARIN SODIUM 40 MG/0.4ML ~~LOC~~ SOLN
40.0000 mg | SUBCUTANEOUS | Status: DC
  Administered 2015-09-28: 40 mg via SUBCUTANEOUS
  Filled 2015-09-27 (×2): qty 0.4

## 2015-09-27 MED ORDER — CARVEDILOL 25 MG PO TABS: 25.0000 mg | ORAL_TABLET | Freq: Two times a day (BID) | ORAL | Status: DC

## 2015-09-27 MED ORDER — LISINOPRIL 40 MG PO TABS: 40.0000 mg | ORAL_TABLET | Freq: Every day | ORAL | Status: DC

## 2015-09-27 MED ORDER — SODIUM CHLORIDE 0.9 % IV SOLN
INTRAVENOUS | Status: DC
  Administered 2015-09-27 – 2015-09-28 (×2): via INTRAVENOUS

## 2015-09-27 MED ORDER — ASPIRIN EC 81 MG PO TBEC
81.0000 mg | DELAYED_RELEASE_TABLET | Freq: Every day | ORAL | Status: DC
  Administered 2015-09-28 – 2015-09-29 (×2): 81 mg via ORAL
  Filled 2015-09-27 (×2): qty 1

## 2015-09-27 MED ORDER — LORAZEPAM 2 MG/ML IJ SOLN
1.0000 mg | Freq: Once | INTRAMUSCULAR | Status: AC
  Administered 2015-09-27: 1 mg via INTRAVENOUS
  Filled 2015-09-27: qty 1

## 2015-09-27 MED ORDER — PROMETHAZINE HCL 25 MG/ML IJ SOLN
12.5000 mg | Freq: Four times a day (QID) | INTRAMUSCULAR | Status: DC | PRN
  Administered 2015-09-28: 12.5 mg via INTRAVENOUS
  Filled 2015-09-27: qty 1

## 2015-09-27 MED ORDER — METOCLOPRAMIDE HCL 5 MG/ML IJ SOLN
10.0000 mg | Freq: Four times a day (QID) | INTRAMUSCULAR | Status: DC | PRN
Start: 2015-09-27 — End: 2015-09-27

## 2015-09-27 MED ORDER — METOPROLOL TARTRATE 1 MG/ML IV SOLN
2.5000 mg | Freq: Four times a day (QID) | INTRAVENOUS | Status: DC
  Administered 2015-09-27 – 2015-09-28 (×3): 2.5 mg via INTRAVENOUS
  Filled 2015-09-27 (×3): qty 5

## 2015-09-27 MED ORDER — PROMETHAZINE HCL 25 MG/ML IJ SOLN
25.0000 mg | Freq: Once | INTRAMUSCULAR | Status: DC
  Filled 2015-09-27: qty 1

## 2015-09-27 MED ORDER — LORAZEPAM 2 MG/ML IJ SOLN: 1.0000 mg | INTRAMUSCULAR | Status: DC | PRN

## 2015-09-27 NOTE — Progress Notes (Signed)
Spoke with Dr. Lincoln Brigham: OK to give Novolog with it listed under allergy per Dr. Lincoln Brigham.

## 2015-09-27 NOTE — Discharge Instructions (Signed)
Please follow up with your primary care doctor.     Cyclic Vomiting Syndrome Cyclic vomiting syndrome is a benign condition in which patients experience bouts or cycles of severe nausea and vomiting that last for hours or even days. The bouts of nausea and vomiting alternate with longer periods of no symptoms and generally good health. Cyclic vomiting syndrome occurs mostly in children, but can affect adults. CAUSES  [facility] has no known cause. Each episode is typically similar to the previous ones. The episodes tend to:   Start at about the same time of day.  Last the same length of time.  Present the same symptoms at the same level of intensity. Cyclic vomiting syndrome can begin at any age in children and adults. Cyclic vomiting syndrome usually starts between the ages of 3 and 7 years. In adults, episodes tend to occur less often than they do in children, but they last longer. Furthermore, the events or situations that trigger episodes in adults cannot always be pinpointed as easily as they can in children. There are 4 phases of cyclic vomiting syndrome: 1. Prodrome. The prodrome phase signals that an episode of nausea and vomiting is about to begin. This phase can last from just a few minutes to several hours. This phase is often marked by belly (abdominal) pain. Sometimes taking medicine early in the prodrome phase can stop an episode in progress. However, sometimes there is no warning. A person may simply wake up in the middle of the night or early morning and begin vomiting. 2. Episode. The episode phase consists of:  Severe vomiting.  Nausea.  Gagging (retching). 3. Recovery. The recovery phase begins when the nausea and vomiting stop. Healthy color, appetite, and energy return. 4. Symptom-free interval. The symptom-free interval phase is the period between episodes when no symptoms are present. TRIGGERS Episodes can be triggered by an infection or event. Examples of triggers  include:  Infections.  Colds, allergies, sinus problems, and the flu.  Eating certain foods such as chocolate or cheese.  Foods with monosodium glutamate (MSG) or preservatives.  Fast foods.  Pre-packaged foods.  Foods with low nutritional value (junk foods).  Overeating.  Eating just before going to bed.  Hot weather.  Dehydration.  Not enough sleep or poor sleep quality.  Physical exhaustion.  Menstruation.  Motion sickness.  Emotional stress (school or home difficulties).  Excitement or stress. SYMPTOMS  The main symptoms of cyclic vomiting syndrome are:  Severe vomiting.  Nausea.  Gagging (retching). Episodes usually begin at night or the first thing in the morning. Episodes may include vomiting or retching up to 5 or 6 times an hour during the worst of the episode. Episodes usually last anywhere from 1 to 4 days. Episodes can last for up to 10 days. Other symptoms include:  Paleness.  Exhaustion.  Listlessness.  Abdominal pain.  Loose stools or diarrhea. Sometimes the nausea and vomiting are so severe that a person appears to be almost unconscious. Sensitivity to light, headache, fever, dizziness, may also accompany an episode. In addition, the vomiting may cause drooling and excessive thirst. Drinking water usually leads to more vomiting, though the water can dilute the acid in the vomit, making the episode a little less painful. Continuous vomiting can lead to dehydration, which means that the body has lost excessive water and salts. DIAGNOSIS  Cyclic vomiting syndrome is hard to diagnose because there are no clear tests to identify it. A caregiver must diagnose cyclic vomiting syndrome by looking  at symptoms and medical history. A caregiver must exclude more common diseases or disorders that can also cause nausea and vomiting. Also, diagnosis takes time because caregivers need to identify a pattern or cycle to the vomiting. TREATMENT  Cyclic vomiting  syndrome cannot be cured. Treatment varies, but people with cyclic vomiting syndrome should get plenty of rest and sleep and take medications that prevent, stop, or lessen the vomiting episodes and other symptoms. People whose episodes are frequent and long-lasting may be treated during the symptom-free intervals in an effort to prevent or ease future episodes. The symptom-free phase is a good time to eliminate anything known to trigger an episode. For example, if episodes are brought on by stress or excitement, this period is the time to find ways to reduce stress and stay calm. If sinus problems or allergies cause episodes, those conditions should be treated. The triggers listed above should be avoided or prevented. Because of the similarities between migraine and cyclic vomiting syndrome, caregivers treat some people with severe cyclic vomiting syndrome with drugs that are also used for migraine headaches. The drugs are designed to:  Prevent episodes.  Reduce their frequency.  Lessen their severity. HOME CARE INSTRUCTIONS Once a vomiting episode begins, treatment is supportive. It helps to stay in bed and sleep in a dark, quiet room. Severe nausea and vomiting may require hospitalization and intravenous (IV) fluids to prevent dehydration. Relaxing medications (sedatives) may help if the nausea continues. Sometimes, during the prodrome phase, it is possible to stop an episode from happening altogether. Only take over-the-counter or prescription medicines for pain, discomfort or fever as directed by your caregiver. Do not give aspirin to children. During the recovery phase, drinking water and replacing lost electrolytes (salts in the blood) are very important. Electrolytes are salts that the body needs to function well and stay healthy. Symptoms during the recovery phase can vary. Some people find that their appetites return to normal immediately, while others need to begin by drinking clear liquids and  then move slowly to solid food. RELATED COMPLICATIONS The severe vomiting that defines cyclic vomiting syndrome is a risk factor for several complications:  Dehydration--Vomiting causes the body to lose water quickly.  Electrolyte imbalance--Vomiting also causes the body to lose the important salts it needs to keep working properly.  Peptic esophagitis--The tube that connects the mouth to the stomach (esophagus) becomes injured from the stomach acid that comes up with the vomit.  Hematemesis--The esophagus becomes irritated and bleeds, so blood mixes with the vomit.  Mallory-Weiss tear--The lower end of the esophagus may tear open or the stomach may bruise from vomiting or retching.  Tooth decay--The acid in the vomit can hurt the teeth by corroding the tooth enamel. SEEK MEDICAL CARE IF: You have questions or problems. Document Released: 02/21/2002 Document Revised: 03/06/2012 Document Reviewed: 03/22/2011 [facility] Patient Information 2015 [place], [place]. This information is not intended to replace advice given to you by your health care provider. Make sure you discuss any questions you have with your health care provider.

## 2015-09-27 NOTE — ED Notes (Signed)
Pt has been taking sips of water---- tolerating well; pt denies nausea, no further vomiting since after receiving Reglan and Ativan.

## 2015-09-27 NOTE — ED Notes (Signed)
She is weeping nearly hysterically as she tells me she is having upper abd. Pain with occasional vomiting "for a week--I been here three times and they keep sending me home".  She denies fever/diarrhea/dysuria.

## 2015-09-27 NOTE — ED Notes (Signed)
PA @ bedside.

## 2015-09-27 NOTE — Progress Notes (Deleted)
[facility] Admission History and Physical Service Pager: [phone]  Patient name: [name] [name] Medical record number: [id number] Date of birth: 1966/11/29 Age: 49 y.o. Gender: female  Primary Care Provider: Phill Myron, MD Consultants: None Code Status: Full  Chief Complaint:  Nausea/vomiting  Assessment and Plan: [name] [name] is a 49 y.o. female presenting with nausea vomiting . PMH is significant for cyclic vomiting syndrome, marijuana use, HTN, DM, CKD III, gastroparesis, pancreatitis  Nausea and vomiting: Similar to previous episodes of cyclic vomiting. Likely secondary to cyclic vomiting from MArijuana use. Last marijuana use several days ago, with usual daily use makes current symptoms likely due to cessation of marijauna. Currently stable with last emesis several hour prior. Lipase 27, WBC 7.6,  LFTs wnl. S/p IV ativan x2, IV reglan, IVF bolus in the ED, and now while no longer vomiting, pt states she is very sleepy but arousable -Admit to Tele, dr [name] attending -Ativan 0.27m IV q4hrs PRN, monitor for oversedation -Phenergan 12.534mIV q8hrs PRN -Dilaudid 0.73m34mV q4h PRNsevere abdominal pain -NPO for now, advance diet as tolerated -IVF _0  cc until tolerating PO -PPI  Diabetes mellitus, type 2: Blood glucose 209 on presentation. Last A1C of 7.4 on 05/08/2015. On Lantus at home -Hold home lantus in setting of NPO -Sensitive sliding scale -ACHS CBG -BMET in am   Hypokalemia: K 3.3 on admission, likley secondary to emesis - Follow BMET in AM, replete as needed  Hypertension: BP max 226/93, now to 176/73. Likely worsened in setting of retching and inability to take PO at home. On Coreg 25 BID, Hydralazine 70m573mD, Lisinopril 40mg773m-Hold home meds, NPO -Hydralazine 73mg I673m4 PRN SBP>180, DBP>100 - Lopressor 2.73mg q613m-Monitor BPs -Resume home meds when able  Bacterial Vaginosis- + clue cells on wet prep on 9/30. Currently not  endorsing s/s of BV - hold flagyl for now insetting of N/V - consider starting when symptoms resolve  Anemia: baseline of around 10-12. 10.8 on admission. -Monitor CBC  Constipation -Colace, Miralax when able to tolerate PO  CKD: Baseline creatinine of around 2. Creatinine on admission of 1.81 -Follow-up Bmet in AM  -IVF  FEN/GI: NPO, IVF 125 cc.hr Prophylaxis: Lovenox  Disposition: admit to telemetry, Attending Dr Neal  HNori Riisry of Present Illness: History provided by family and patient    [name] DEVONY [id number] y.o.31emale presenting with nausea vomiting that started yesterday after stopping marijuana use.Pt reports her last marijauna use was several days ago. She has been trying to cut back. She additionally takes Ativan at home for nausea but has recently run out which has exacerbated her symptoms.  She went to the ED a total of 3 times over the last 24 hours, but symptoms continued necesitating her return  She denies chest pain, current abd pain, bilious emesis,fevers, however family member in room reports diarrhea x1 this afternoon. Has had mild blood tinged emesis for last few episodes, pt unsure how many. Does endorse nausea. History somewhat limited to patient sleepiness   Review Of Systems: Per HPI   Patient Active Problem List   Diagnosis Date Noted  . Cyclical vomiting syndrome 09/27/2015  . Gastroparesis 08/21/2015  . Hematuria 07/09/2015  . Blood in stool 06/23/2015  . Constipation 06/06/2015  . Onychomycosis 03/25/2015  . Encounter for chronic pain management 01/06/2015  . Vitamin D deficiency 11/28/2014  . At risk for polypharmacy 08/15/2014  . Other and unspecified hyperlipidemia 08/15/2014  .  Leg cramping 07/18/2014  . Rotator cuff syndrome 04/19/2014  . Biceps tendinopathy of right upper extremity 04/05/2014  . Protein-calorie malnutrition, severe 12/17/2013  . Anemia 11/15/2013  . CKD (chronic kidney disease) 08/17/2013  . Esophagitis 04/18/2013  .  Preventative health care 09/18/2012  . High risk social situation 08/31/2012  . Allergic rhinitis, seasonal 05/04/2012  . Marijuana smoker 11/03/2011  . Uncontrolled secondary diabetes mellitus with stage 3 CKD (GFR 30-59) 10/23/2011  . Cyclic vomiting syndrome 08/03/2011  . Hot flashes 07/29/2011  . Backache 04/10/2007  . DEPRESSION, MAJOR, RECURRENT 02/23/2007  . Anxiety state 02/23/2007  . TOBACCO DEPENDENCE 02/23/2007  . MIGRAINE, UNSPEC., W/O INTRACTABLE MIGRAINE 02/23/2007  . Essential hypertension, malignant 02/23/2007  . INSOMNIA NOS 02/23/2007  . NEUROPATHY, DIABETIC 02/23/2007  . GERD 01/27/2004   Past Medical History: Past Medical History  Diagnosis Date  . PANCREATITIS 05/09/2008  . Cyclic vomiting syndrome   . Hypertension   . Complication of anesthesia     "problems waking up"  . Heart murmur   . Myocardial infarction [facility]) 07/2007    "they say I've had a silent one; I don't know"  . Pneumonia   . Anemia   . Blood transfusion   . GERD (gastroesophageal reflux disease)   . Anxiety   . Depression   . Smoker   . Marijuana smoker, continuous ([place])   . NEUROPATHY, DIABETIC 02/23/2007  . Type II diabetes mellitus ([place])   . Diabetic gastroparesis ([place])     /e-chart  . Chronic kidney disease (CKD), stage III (moderate)   . Nausea & vomiting 06/10/2015  . Intestinal impaction [facility])    Past Surgical History: Past Surgical History  Procedure Laterality Date  . Port-a-cath placement  ~ 2008    right chest; "poor access; frequent hospitalizations"  . Laparotomy and lysis of adhesions    . Abdominal hysterectomy  02/2002  . Cholecystectomy  1980's  . Cardiac catheterization    . Dilation and evacuation  X3  . Left hand surgery    . Cataract extraction w/ intraocular lens  implant, bilateral    . Colonoscopy    . Upper gi endoscopy    . Cesarean section     Social History: Social History  Substance Use Topics  . Smoking status: Current Some Day Smoker -- 0.50  packs/day for 22 years    Types: Cigarettes  . Smokeless tobacco: Never Used  . Alcohol Use: No   Additional social history: + tobacco use, denies ETOH, daily marijuana use Please also refer to relevant sections of EMR.  Family History: Family History  Problem Relation Age of Onset  . Diabetes type II Mother   . Diabetes Mother   . Hypertension Mother   . Diabetes type II Sister   . Diabetes Sister   . Hypertension Sister   . Hypertension Brother    Allergies and Medications: Allergies  Allergen Reactions  . Bee Venom Anaphylaxis  . Acetaminophen Nausea And Vomiting  . Novolog [Insulin Aspart] Other (See Comments)    Muscles in feet cramp  . Other     Sorbaview causes rash  . Tegaderm Ag Mesh [Silver]     Blisters  . Erythromycin Nausea And Vomiting   Current Facility-Administered Medications on File Prior to Encounter  Medication Dose Route Frequency Provider Last Rate Last Dose  . 0.9 %  sodium chloride infusion   Intravenous Continuous Kinnie Feil, MD 20 mL/hr at 04/19/13 1500 500 mL at 04/19/13 1500  Current Outpatient Prescriptions on File Prior to Encounter  Medication Sig Dispense Refill  . aspirin EC 81 MG tablet Take 1 tablet (81 mg total) by mouth daily. 90 tablet 3  . carvedilol (COREG) 25 MG tablet Take 1 tablet (25 mg total) by mouth 2 (two) times daily with a meal. 60 tablet 2  . diazepam (VALIUM) 5 MG tablet Take 1 tablet (5 mg total) by mouth 2 (two) times daily. 20 tablet 0  . hydrALAZINE (APRESOLINE) 10 MG tablet Take 1 tablet (10 mg total) by mouth 3 (three) times daily. 90 tablet 2  . insulin glargine (LANTUS) 100 UNIT/ML injection Inject 0.22 mLs (22 Units total) into the skin daily. 10 mL 2  . lisinopril (PRINIVIL,ZESTRIL) 40 MG tablet Take 1 tablet (40 mg total) by mouth daily. 30 tablet 2  . LORazepam (ATIVAN) 1 MG tablet Take 1 tablet (1 mg total) by mouth 2 (two) times daily as needed (cyclic vomiting symptoms). 30 tablet 0  . blood glucose  meter kit and supplies KIT Dispense based on patient and insurance preference. Use up to four times daily as directed. (FOR ICD-9 250.00, 250.01). 1 each 0  . cetirizine (ZYRTEC) 10 MG tablet Take 1 tablet (10 mg total) by mouth daily. (Patient not taking: Reported on 08/03/2015) 30 tablet 11  . metroNIDAZOLE (FLAGYL) 500 MG tablet Take 1 tablet (500 mg total) by mouth 2 (two) times daily. (Patient taking differently: Take 500 mg by mouth 2 (two) times daily. For 14 days) 14 tablet 0  . oxyCODONE (OXY IR/ROXICODONE) 5 MG immediate release tablet Take one tab by mouth up to twice a day as needed for severe pain 15 tablet 0  . polyethylene glycol (MIRALAX / GLYCOLAX) packet Take 17 g by mouth daily as needed. May increase in 2-3 days to 2 times daily, Until stooling regularly (Patient not taking: Reported on 08/03/2015) 30 packet prn  . pregabalin (LYRICA) 25 MG capsule TAKE 1 TABLET BY MOUTH AT BEDTIME AS NEEDED 30 capsule 2  . promethazine (PHENERGAN) 12.5 MG tablet Take 1 tablet (12.5 mg total) by mouth every 6 (six) hours as needed for nausea or vomiting. 30 tablet 0  . promethazine (PHENERGAN) 25 MG suppository Place 1 suppository (25 mg total) rectally every 6 (six) hours as needed for nausea or vomiting. 12 each 0    Objective: BP 198/94 mmHg  Pulse 94  Temp(Src) 98.7 F (37.1 C) (Oral)  Resp 16  SpO2 100% Exam: General: NAD, sleeping in bed but awakens to voice HEENT: PERRL, MMM, NCAT Cardiovascular: RRR, no murmurs auscultated Respiratory: CTAB Abdomen: soft, non tender Skin: no rashes or lesions appreciated Neuro: sleepy but arousable, no focal deficits Psych: Mildly irritable   Labs and Imaging: CBC BMET   Recent Labs Lab 09/27/15 1233  WBC 7.6  HGB 10.8*  HCT 31.5*  PLT 207    Recent Labs Lab 09/27/15 1233  NA 140  K 3.3*  CL 107  CO2 22  BUN 24*  CREATININE 1.81*  GLUCOSE 209*  CALCIUM 9.4      XR Abd  IMPRESSION: Relative paucity of bowel gas. While  this finding may be seen normally, it may also indicate enteritis or early ileus. Obstruction not felt to be likely. Moderate stool noted in colon. Lungs clear.     Veatrice Bourbon, MD 09/27/2015, 4:16 PM PGY-2, [facility] Intern pager: [phone], text pages welcome

## 2015-09-27 NOTE — ED Notes (Signed)
Medication administration delayed due to being unable to access port, no mini loc ports in supply room.  Materials notified and reported they would bring mini loc supply to the unit.  Pa made aware. IM ativan will be administered.

## 2015-09-27 NOTE — ED Provider Notes (Signed)
CSN: [id number]     Arrival date & time 09/27/15  1101 History   First MD Initiated Contact with Patient 09/27/15 1113     Chief Complaint  Patient presents with  . Abdominal Pain     (Consider location/radiation/quality/duration/timing/severity/associated sxs/prior Treatment) The history is provided by the patient.     Pt with hx pancreatitis, DM, gastroparesis, cyclic vomiting syndrome, SBO presents to ED with 3rd ED visit in 2 days for epigastric pain, N/V, not tolerating PO, now with reported hematemesis.  The epigastric pain has been ongoing for 1 week, is burning.  Denies fevers, urinary symptoms.  Has not had recent BM but is able to pass flatus.  Past abdominal surgical hx cholecystectomy, and hysterectomy. Also had laparotomy and lysis of adhesions for SBO.   Past Medical History  Diagnosis Date  . PANCREATITIS 05/09/2008  . Cyclic vomiting syndrome   . Hypertension   . Complication of anesthesia     "problems waking up"  . Heart murmur   . Myocardial infarction [facility]) 07/2007    "they say I've had a silent one; I don't know"  . Pneumonia   . Anemia   . Blood transfusion   . GERD (gastroesophageal reflux disease)   . Anxiety   . Depression   . Smoker   . Marijuana smoker, continuous ([place])   . NEUROPATHY, DIABETIC 02/23/2007  . Type II diabetes mellitus ([place])   . Diabetic gastroparesis ([place])     /e-chart  . Chronic kidney disease (CKD), stage III (moderate)   . Nausea & vomiting 06/10/2015  . Intestinal impaction [facility])    Past Surgical History  Procedure Laterality Date  . Port-a-cath placement  ~ 2008    right chest; "poor access; frequent hospitalizations"  . Laparotomy and lysis of adhesions    . Abdominal hysterectomy  02/2002  . Cholecystectomy  1980's  . Cardiac catheterization    . Dilation and evacuation  X3  . Left hand surgery    . Cataract extraction w/ intraocular lens  implant, bilateral    . Colonoscopy    . Upper gi endoscopy    . Cesarean  section     Family History  Problem Relation Age of Onset  . Diabetes type II Mother   . Diabetes Mother   . Hypertension Mother   . Diabetes type II Sister   . Diabetes Sister   . Hypertension Sister   . Hypertension Brother    Social History  Substance Use Topics  . Smoking status: Current Some Day Smoker -- 0.50 packs/day for 22 years    Types: Cigarettes  . Smokeless tobacco: Never Used  . Alcohol Use: No   OB History    Gravida Para Term Preterm AB TAB SAB Ectopic Multiple Living   '7    3 3    4     ' Review of Systems  All other systems reviewed and are negative.     Allergies  Bee venom; Acetaminophen; Novolog; Other; Tegaderm ag mesh; and Erythromycin  Home Medications   Prior to Admission medications   Medication Sig Start Date End Date Taking? Authorizing Provider  aspirin EC 81 MG tablet Take 1 tablet (81 mg total) by mouth daily. 06/02/15  Yes Hilton Sinclair, MD  carvedilol (COREG) 25 MG tablet Take 1 tablet (25 mg total) by mouth 2 (two) times daily with a meal. 06/02/15  Yes Hilton Sinclair, MD  diazepam (VALIUM) 5 MG tablet Take 1 tablet (5 mg  total) by mouth 2 (two) times daily. 09/26/15  Yes Antony Blackbird, MD  hydrALAZINE (APRESOLINE) 10 MG tablet Take 1 tablet (10 mg total) by mouth 3 (three) times daily. 06/02/15  Yes Hilton Sinclair, MD  insulin glargine (LANTUS) 100 UNIT/ML injection Inject 0.22 mLs (22 Units total) into the skin daily. 06/02/15  Yes Hilton Sinclair, MD  lisinopril (PRINIVIL,ZESTRIL) 40 MG tablet Take 1 tablet (40 mg total) by mouth daily. 08/13/15  Yes Olam Idler, MD  LORazepam (ATIVAN) 1 MG tablet Take 1 tablet (1 mg total) by mouth 2 (two) times daily as needed (cyclic vomiting symptoms). 08/21/15  Yes Olam Idler, MD  blood glucose meter kit and supplies KIT Dispense based on patient and insurance preference. Use up to four times daily as directed. (FOR ICD-9 250.00, 250.01). 06/02/15   Hilton Sinclair, MD   cetirizine (ZYRTEC) 10 MG tablet Take 1 tablet (10 mg total) by mouth daily. Patient not taking: Reported on 08/03/2015 06/20/15   Hilton Sinclair, MD  metroNIDAZOLE (FLAGYL) 500 MG tablet Take 1 tablet (500 mg total) by mouth 2 (two) times daily. Patient taking differently: Take 500 mg by mouth 2 (two) times daily. For 14 days 09/26/15   Antony Blackbird, MD  oxyCODONE (OXY IR/ROXICODONE) 5 MG immediate release tablet Take one tab by mouth up to twice a day as needed for severe pain 07/18/15   Olam Idler, MD  polyethylene glycol [facility]) packet Take 17 g by mouth daily as needed. May increase in 2-3 days to 2 times daily, Until stooling regularly Patient not taking: Reported on 08/03/2015 06/02/15   Hilton Sinclair, MD  pregabalin (LYRICA) 25 MG capsule TAKE 1 TABLET BY MOUTH AT BEDTIME AS NEEDED 08/21/15   Olam Idler, MD  promethazine (PHENERGAN) 12.5 MG tablet Take 1 tablet (12.5 mg total) by mouth every 6 (six) hours as needed for nausea or vomiting. 08/21/15   Olam Idler, MD  promethazine (PHENERGAN) 25 MG suppository Place 1 suppository (25 mg total) rectally every 6 (six) hours as needed for nausea or vomiting. 09/27/15   Tatyana Kirichenko, PA-C   BP 226/93 mmHg  Pulse 126  Temp(Src) 98.6 F (37 C) (Oral)  Resp 17  SpO2 100% Physical Exam  Constitutional: She appears well-developed and well-nourished. She is active. She appears distressed.  Uncomfortable appearing, crying, moving constantly on stretcher.    HENT:  Head: Normocephalic and atraumatic.  Neck: Neck supple.  Cardiovascular: Normal rate and regular rhythm.   Pulmonary/Chest: Effort normal and breath sounds normal. No respiratory distress. She has no wheezes. She has no rales.  Abdominal: Soft. She exhibits no distension. There is tenderness in the epigastric area and left upper quadrant. There is no rebound and no guarding.  Neurological: She is alert.  Skin: She is not diaphoretic.  Nursing note  and vitals reviewed.   ED Course  Procedures (including critical care time) Labs Review Labs Reviewed  COMPREHENSIVE METABOLIC PANEL - Abnormal; Notable for the following:    Potassium 3.3 (*)    Glucose, Bld 209 (*)    BUN 24 (*)    Creatinine, Ser 1.81 (*)    GFR calc non Af Amer 32 (*)    GFR calc Af Amer 37 (*)    All other components within normal limits  CBC WITH DIFFERENTIAL/PLATELET - Abnormal; Notable for the following:    RBC 3.49 (*)    Hemoglobin 10.8 (*)    HCT  31.5 (*)    All other components within normal limits  LIPASE, BLOOD    Imaging Review Dg Hip Unilat With Pelvis 2-3 Views Left  09/26/2015   CLINICAL DATA:  Left hip popping sensation and pain for 1 week  EXAM: DG HIP (WITH OR WITHOUT PELVIS) 2-3V LEFT  COMPARISON:  None.  FINDINGS: There is no evidence of hip fracture or dislocation. There is no evidence of arthropathy or other focal bone abnormality.  IMPRESSION: Negative.   Electronically Signed   By: Conchita Paris M.D.   On: 09/26/2015 09:15   I have personally reviewed and evaluated these images and lab results as part of my medical decision-making.   EKG Interpretation None      MDM   Final diagnoses:  Intractable cyclical vomiting with nausea  Epigastric pain  Essential hypertension    Afebrile nontoxic pt with hx gastroparesis and cyclic vomiting syndrome with 3rd ED visit in 2 days for epigastric pain, uncontrolled N/V, not tolerating PO or home medications.  Labs significant for hyperglycemia (209) without anion gap.  Mild hypokalemia, anemia.  Acute abd xray without SBO or free air.  IVF, reglan, ativan given with some relief, pt continued to complain of nausea.  Family and patient concerned about going home, prefer admission to hospital.  I have spoken with [place] resident who accepts patient for admission . Attending is Dr Dorcas Mcmurray.    [place], PA-C 09/27/15 1532  Debby Freiberg, MD 09/30/15 [phone]

## 2015-09-27 NOTE — ED Notes (Signed)
Pt was given ice water for fluid challenge.

## 2015-09-27 NOTE — ED Notes (Signed)
Pt request blood draw from port

## 2015-09-27 NOTE — Progress Notes (Signed)
Patient arrived on unit via EMS from [facility] ED. Admissions notified of patient's arrival. Telemetry placed per MD order and CMT notified. IV team notified of right chest port.

## 2015-09-28 ENCOUNTER — Encounter (HOSPITAL_COMMUNITY): Payer: Self-pay | Admitting: General Practice

## 2015-09-28 LAB — BASIC METABOLIC PANEL
Anion gap: 11 (ref 5–15)
BUN: 23 mg/dL — ABNORMAL HIGH (ref 6–20)
CALCIUM: 8.6 mg/dL — AB (ref 8.9–10.3)
CO2: 23 mmol/L (ref 22–32)
CREATININE: 2.19 mg/dL — AB (ref 0.44–1.00)
Chloride: 106 mmol/L (ref 101–111)
GFR calc Af Amer: 29 mL/min — ABNORMAL LOW (ref >60)
GFR calc non Af Amer: 25 mL/min — ABNORMAL LOW (ref >60)
GLUCOSE: 144 mg/dL — AB (ref 65–99)
Potassium: 3.5 mmol/L (ref 3.5–5.1)
Sodium: 140 mmol/L (ref 135–145)

## 2015-09-28 LAB — CBC
HCT: 30.3 % — ABNORMAL LOW (ref 36.0–46.0)
Hemoglobin: 10.5 g/dL — ABNORMAL LOW (ref 12.0–15.0)
MCH: 31.3 pg (ref 26.0–34.0)
MCHC: 34.7 g/dL (ref 30.0–36.0)
MCV: 90.2 fL (ref 78.0–100.0)
PLATELETS: 173 10*3/uL (ref 150–400)
RBC: 3.36 MIL/uL — ABNORMAL LOW (ref 3.87–5.11)
RDW: 14.1 % (ref 11.5–15.5)
WBC: 7.3 10*3/uL (ref 4.0–10.5)

## 2015-09-28 LAB — RAPID URINE DRUG SCREEN, HOSP PERFORMED
Amphetamines: NOT DETECTED
Barbiturates: NOT DETECTED
Benzodiazepines: POSITIVE — AB
Cocaine: NOT DETECTED
OPIATES: POSITIVE — AB
Tetrahydrocannabinol: POSITIVE — AB

## 2015-09-28 LAB — GLUCOSE, CAPILLARY
GLUCOSE-CAPILLARY: 357 mg/dL — AB (ref 65–99)
Glucose-Capillary: 117 mg/dL — ABNORMAL HIGH (ref 65–99)
Glucose-Capillary: 107 mg/dL — ABNORMAL HIGH (ref 65–99)

## 2015-09-28 MED ORDER — INFLUENZA VAC SPLIT QUAD 0.5 ML IM SUSY
0.5000 mL | PREFILLED_SYRINGE | INTRAMUSCULAR | Status: AC
  Administered 2015-09-29: 0.5 mL via INTRAMUSCULAR
  Filled 2015-09-28: qty 0.5

## 2015-09-28 MED ORDER — PANTOPRAZOLE SODIUM 40 MG PO TBEC
40.0000 mg | DELAYED_RELEASE_TABLET | Freq: Every day | ORAL | Status: DC
  Administered 2015-09-28 – 2015-09-29 (×2): 40 mg via ORAL
  Filled 2015-09-28 (×2): qty 1

## 2015-09-28 MED ORDER — CARVEDILOL 25 MG PO TABS
25.0000 mg | ORAL_TABLET | Freq: Two times a day (BID) | ORAL | Status: DC
  Administered 2015-09-28 – 2015-09-29 (×3): 25 mg via ORAL
  Filled 2015-09-28 (×3): qty 1

## 2015-09-28 MED ORDER — LORAZEPAM 0.5 MG PO TABS
0.5000 mg | ORAL_TABLET | ORAL | Status: DC | PRN
  Administered 2015-09-28 – 2015-09-29 (×2): 0.5 mg via ORAL
  Filled 2015-09-28 (×2): qty 1

## 2015-09-28 MED ORDER — HYDRALAZINE HCL 10 MG PO TABS
10.0000 mg | ORAL_TABLET | Freq: Three times a day (TID) | ORAL | Status: DC
  Administered 2015-09-28 – 2015-09-29 (×4): 10 mg via ORAL
  Filled 2015-09-28 (×4): qty 1

## 2015-09-28 MED ORDER — INSULIN ASPART 100 UNIT/ML ~~LOC~~ SOLN: 0.0000 [IU] | Freq: Three times a day (TID) | SUBCUTANEOUS | Status: DC

## 2015-09-28 MED ORDER — INSULIN ASPART 100 UNIT/ML ~~LOC~~ SOLN
0.0000 [IU] | Freq: Every day | SUBCUTANEOUS | Status: DC
  Administered 2015-09-28: 5 [IU] via SUBCUTANEOUS

## 2015-09-28 MED ORDER — DEXTROSE-NACL 5-0.45 % IV SOLN
INTRAVENOUS | Status: DC
  Administered 2015-09-28 – 2015-09-29 (×2): via INTRAVENOUS

## 2015-09-28 MED ORDER — HYDROMORPHONE HCL 1 MG/ML IJ SOLN
1.0000 mg | Freq: Once | INTRAMUSCULAR | Status: AC
  Administered 2015-09-28: 1 mg via INTRAVENOUS
  Filled 2015-09-28: qty 1

## 2015-09-28 NOTE — ED Notes (Signed)
Abigail Butts, RN from [facility] called and sts that pt did not arrive with her pants and shirt. RN was advised that in the EMTALA, the transporting RN charted that the belongings were taken by [facility] and to contact them to see if they have them in their possession.

## 2015-09-28 NOTE — H&P (Signed)
[facility] Admission History and Physical Service Pager: [phone]  Patient name: [name] [name] Medical record number: [id number] Date of birth: 08/30/1966 Age: 49 y.o. Gender: female  Primary Care Provider: Phill Myron, MD Consultants: None Code Status: Full  Chief Complaint:  Nausea/vomiting  Assessment and Plan: [name] [name] is a 49 y.o. female presenting with nausea vomiting . PMH is significant for cyclic vomiting syndrome, marijuana use, HTN, DM, CKD III, gastroparesis, pancreatitis  Nausea and vomiting: Similar to previous episodes of cyclic vomiting. Likely secondary to cyclic vomiting from Marijuana use. Last marijuana use several days ago, with usual daily use makes current symptoms likely due to cessation of marijauna. Currently stable with last emesis several hour prior. Lipase 27, WBC 7.6,  LFTs wnl. S/p IV ativan x2, IV reglan, IVF bolus in the ED, and now while no longer vomiting, pt states she is very sleepy but arousable -Admit to Tele, dr [name] attending -Ativan 0.21m IV q4hrs PRN, monitor for oversedation -Phenergan 12.528mIV q8hrs PRN -Dilaudid 0.39m33mV q4h PRNsevere abdominal pain -NPO for now, advance diet as tolerated -IVF '@125'  cc until tolerating PO -PPI - SW consult for substance abuse  Diabetes mellitus, type 2: Blood glucose 209 on presentation. Last A1C of 7.4 on 05/08/2015. On Lantus at home -Hold home lantus in setting of NPO -Sensitive sliding scale -ACHS CBG -BMET in am   Hypokalemia: K 3.3 on admission, likley secondary to emesis - Follow BMET in AM, replete as needed  Hypertension: BP max 226/93, now to 176/73. Likely worsened in setting of retching and inability to take PO at home. On Coreg 25 BID, Hydralazine 40m49mD, Lisinopril 40mg67m-Hold home meds, NPO -Hydralazine 39mg I63m4 PRN SBP>180, DBP>100 - Lopressor 2.39mg q640m-Monitor BPs -Resume home meds when able  Bacterial Vaginosis- + clue cells on  wet prep on 9/30. Currently not endorsing s/s of BV - hold flagyl for now insetting of N/V - consider starting when symptoms resolve  Anemia: baseline of around 10-12. 10.8 on admission. -Monitor CBC  Constipation -Colace, Miralax when able to tolerate PO  CKD: Baseline creatinine of around 2. Creatinine on admission of 1.81 -Follow-up Bmet in AM  -IVF  FEN/GI: NPO, IVF 125 cc.hr Prophylaxis: Lovenox  Disposition: admit to telemetry, Attending Dr Neal  HNori Riisry of Present Illness: History provided by family and patient    [name] TEONDRA NEW[place]1 y.o.1emale presenting with nausea vomiting that started yesterday after stopping marijuana use.Pt reports her last marijauna use was several days ago. She has been trying to cut back. She additionally takes Ativan at home for nausea but has recently run out which has exacerbated her symptoms.  She went to the ED a total of 3 times over the last 24 hours, but symptoms continued necesitating her return  She denies chest pain, current abd pain, bilious emesis,fevers, however family member in room reports diarrhea x1 this afternoon. Has had mild blood tinged emesis for last few episodes, pt unsure how many. Does endorse nausea. History somewhat limited to patient sleepiness   Review Of Systems: Per HPI   Patient Active Problem List   Diagnosis Date Noted  . Cyclical vomiting syndrome 09/27/2015  . Gastroparesis 08/21/2015  . Hematuria 07/09/2015  . Blood in stool 06/23/2015  . Constipation 06/06/2015  . Onychomycosis 03/25/2015  . Encounter for chronic pain management 01/06/2015  . Vitamin D deficiency 11/28/2014  . At risk for polypharmacy 08/15/2014  .  Other and unspecified hyperlipidemia 08/15/2014  . Leg cramping 07/18/2014  . Rotator cuff syndrome 04/19/2014  . Biceps tendinopathy of right upper extremity 04/05/2014  . Protein-calorie malnutrition, severe 12/17/2013  . Anemia 11/15/2013  . CKD (chronic kidney disease)  08/17/2013  . Esophagitis 04/18/2013  . Preventative health care 09/18/2012  . High risk social situation 08/31/2012  . Allergic rhinitis, seasonal 05/04/2012  . Marijuana smoker 11/03/2011  . Uncontrolled secondary diabetes mellitus with stage 3 CKD (GFR 30-59) 10/23/2011  . Cyclic vomiting syndrome 08/03/2011  . Hot flashes 07/29/2011  . Backache 04/10/2007  . DEPRESSION, MAJOR, RECURRENT 02/23/2007  . Anxiety state 02/23/2007  . TOBACCO DEPENDENCE 02/23/2007  . MIGRAINE, UNSPEC., W/O INTRACTABLE MIGRAINE 02/23/2007  . Essential hypertension, malignant 02/23/2007  . INSOMNIA NOS 02/23/2007  . NEUROPATHY, DIABETIC 02/23/2007  . GERD 01/27/2004   Past Medical History: Past Medical History  Diagnosis Date  . PANCREATITIS 05/09/2008  . Cyclic vomiting syndrome   . Hypertension   . Complication of anesthesia     "problems waking up"  . Heart murmur   . Myocardial infarction [facility]) 07/2007    "they say I've had a silent one; I don't know"  . Pneumonia   . Anemia   . Blood transfusion   . GERD (gastroesophageal reflux disease)   . Anxiety   . Depression   . Smoker   . Marijuana smoker, continuous ([place])   . NEUROPATHY, DIABETIC 02/23/2007  . Type II diabetes mellitus ([place])   . Diabetic gastroparesis ([place])     /e-chart  . Chronic kidney disease (CKD), stage III (moderate)   . Nausea & vomiting 06/10/2015  . Intestinal impaction [facility])    Past Surgical History: Past Surgical History  Procedure Laterality Date  . Port-a-cath placement  ~ 2008    right chest; "poor access; frequent hospitalizations"  . Laparotomy and lysis of adhesions    . Abdominal hysterectomy  02/2002  . Cholecystectomy  1980's  . Cardiac catheterization    . Dilation and evacuation  X3  . Left hand surgery    . Cataract extraction w/ intraocular lens  implant, bilateral    . Colonoscopy    . Upper gi endoscopy    . Cesarean section     Social History: Social History  Substance Use Topics  .  Smoking status: Current Some Day Smoker -- 0.50 packs/day for 22 years    Types: Cigarettes  . Smokeless tobacco: Never Used  . Alcohol Use: No   Additional social history: + tobacco use, denies ETOH, daily marijuana use Please also refer to relevant sections of EMR.  Family History: Family History  Problem Relation Age of Onset  . Diabetes type II Mother   . Diabetes Mother   . Hypertension Mother   . Diabetes type II Sister   . Diabetes Sister   . Hypertension Sister   . Hypertension Brother    Allergies and Medications: Allergies  Allergen Reactions  . Bee Venom Anaphylaxis  . Acetaminophen Nausea And Vomiting  . Novolog [Insulin Aspart] Other (See Comments)    Muscles in feet cramp  . Other     Sorbaview causes rash  . Tegaderm Ag Mesh [Silver]     Blisters  . Erythromycin Nausea And Vomiting   Current Facility-Administered Medications on File Prior to Encounter  Medication Dose Route Frequency Provider Last Rate Last Dose  . 0.9 %  sodium chloride infusion   Intravenous Continuous Kinnie Feil, MD 20 mL/hr at 04/19/13  1500 500 mL at 04/19/13 1500   Current Outpatient Prescriptions on File Prior to Encounter  Medication Sig Dispense Refill  . aspirin EC 81 MG tablet Take 1 tablet (81 mg total) by mouth daily. 90 tablet 3  . carvedilol (COREG) 25 MG tablet Take 1 tablet (25 mg total) by mouth 2 (two) times daily with a meal. 60 tablet 2  . diazepam (VALIUM) 5 MG tablet Take 1 tablet (5 mg total) by mouth 2 (two) times daily. 20 tablet 0  . hydrALAZINE (APRESOLINE) 10 MG tablet Take 1 tablet (10 mg total) by mouth 3 (three) times daily. 90 tablet 2  . insulin glargine (LANTUS) 100 UNIT/ML injection Inject 0.22 mLs (22 Units total) into the skin daily. 10 mL 2  . lisinopril (PRINIVIL,ZESTRIL) 40 MG tablet Take 1 tablet (40 mg total) by mouth daily. 30 tablet 2  . LORazepam (ATIVAN) 1 MG tablet Take 1 tablet (1 mg total) by mouth 2 (two) times daily as needed (cyclic  vomiting symptoms). 30 tablet 0  . blood glucose meter kit and supplies KIT Dispense based on patient and insurance preference. Use up to four times daily as directed. (FOR ICD-9 250.00, 250.01). 1 each 0  . cetirizine (ZYRTEC) 10 MG tablet Take 1 tablet (10 mg total) by mouth daily. (Patient not taking: Reported on 08/03/2015) 30 tablet 11  . metroNIDAZOLE (FLAGYL) 500 MG tablet Take 1 tablet (500 mg total) by mouth 2 (two) times daily. (Patient taking differently: Take 500 mg by mouth 2 (two) times daily. For 14 days) 14 tablet 0  . oxyCODONE (OXY IR/ROXICODONE) 5 MG immediate release tablet Take one tab by mouth up to twice a day as needed for severe pain 15 tablet 0  . polyethylene glycol (MIRALAX / GLYCOLAX) packet Take 17 g by mouth daily as needed. May increase in 2-3 days to 2 times daily, Until stooling regularly (Patient not taking: Reported on 08/03/2015) 30 packet prn  . pregabalin (LYRICA) 25 MG capsule TAKE 1 TABLET BY MOUTH AT BEDTIME AS NEEDED 30 capsule 2  . promethazine (PHENERGAN) 12.5 MG tablet Take 1 tablet (12.5 mg total) by mouth every 6 (six) hours as needed for nausea or vomiting. 30 tablet 0  . promethazine (PHENERGAN) 25 MG suppository Place 1 suppository (25 mg total) rectally every 6 (six) hours as needed for nausea or vomiting. 12 each 0    Objective: BP 198/94 mmHg  Pulse 94  Temp(Src) 98.7 F (37.1 C) (Oral)  Resp 16  SpO2 100% Exam: General: NAD, sleeping in bed but awakens to voice HEENT: PERRL, MMM, NCAT Cardiovascular: RRR, no murmurs auscultated Respiratory: CTAB Abdomen: soft, non tender Skin: no rashes or lesions appreciated Neuro: sleepy but arousable, no focal deficits Psych: Mildly irritable   Labs and Imaging: CBC BMET   Recent Labs Lab 09/27/15 1233  WBC 7.6  HGB 10.8*  HCT 31.5*  PLT 207    Recent Labs Lab 09/27/15 1233  NA 140  K 3.3*  CL 107  CO2 22  BUN 24*  CREATININE 1.81*  GLUCOSE 209*  CALCIUM 9.4      XR  Abd  IMPRESSION: Relative paucity of bowel gas. While this finding may be seen normally, it may also indicate enteritis or early ileus. Obstruction not felt to be likely. Moderate stool noted in colon. Lungs clear.     Veatrice Bourbon, MD 09/27/2015, 4:16 PM PGY-2, [place] Intern pager: [phone], text pages welcome

## 2015-09-28 NOTE — Progress Notes (Signed)
Family Medicine Teaching Service Daily Progress Note Intern Pager: [phone]  Patient name: [name] [name] Medical record number: [id number] Date of birth: 1966-07-27 Age: 49 y.o. Gender: female  Primary Care Provider: Phill Myron, MD Consultants: None Code Status: Full  Pt Overview and Major Events to Date:  10/1: Admitted for CVS, made NPO 10/2: Advance to clear liquid, restart po medications  Assessment and Plan: [name] [name] is a 49 y.o. female presenting with nausea and vomiting consistent with cyclic vomiting syndrome. PMH is significant for cyclic vomiting syndrome, marijuana use, HTN, DM, CKD III, gastroparesis, pancreatitis.  Cyclic vomiting syndrome: Relapse in symptoms likely related to recent cessation of marijuana use. Hemodynamically stable with stable AKI, otherwise findings not consistent with significant emesis (no alkalosis or ketonuria). More sinister causes of abdominal pain ruled out. - STOP dilaudid  - Ativan 0.5mg  IV q4hrs PRN, monitor for oversedation - Phenergan 12.5mg  IV q8hrs prn - Euvolemic --> Maintenance IVF until tolerating PO - IV PPI - CSW for ongoing substance abuse - UDS  Hypertension: Chronic with worsening due to inability to take home po medications (Coreg 25 BID, Hydralazine 10mg  TID, Lisinopril 40mg ) - Restart home medications, D/C metoprolol IV - Hydralazine 5mg  IV q4 PRN SBP>180, DBP>100 - D/C telemetry, no events since admission  CKD stage III: Mild AKI, near historical baseline, suspect pre-renal.  - IVF's as above, daily BMP - hold ACE inhibitor for now  Insulin-dependent T2DM:Hb A1c: 7.4% on 05/08/2015.  -Hold home lantus in setting of NPO, will need to restart limited dose with advancing diet -Sensitive sliding scale - AC/HS CBG  Asymptomatic BV: Moderate clue cells  9/30, not thought to be related to abdominal discomfort. - Hold flagyl, consider tx when acute illness subsides  Anemia: Normocytic/normochromic, stable since  admission at historical baseline (10 - 12) - Monitor CBC  Hypokalemia: Secondary to GI losses, resolved.   FEN/GI: NPO w/ice chips, IVF 125 cc.hr Prophylaxis: lovenox  Disposition: Continue med-surg admission; D/C pending ability to reliably tolerate po  Subjective:  Pt woke up in some abdominal pain but not nauseous or vomiting today, desires advancement of diet.   Objective: Temp:  [98.6 F (37 C)-99.3 F (37.4 C)] 99.3 F (37.4 C) (10/02 0500) Pulse Rate:  [78-126] 78 (10/02 0500) Resp:  [16-18] 16 (10/02 0500) BP: (154-226)/(73-94) 154/82 mmHg (10/02 0500) SpO2:  [100 %] 100 % (10/02 0500) Weight:  [138 lb 14.4 oz (63.005 kg)] 138 lb 14.4 oz (63.005 kg) (10/01 2042) Physical Exam: Gen: Chronically ill-appearing 49yo female in no distress CV: Regular rate, II/VI RUSB SEM, no JVD or LE edema Respiratory: Nonlabored on room air, CTAB Abdomen: Soft, epigastric tenderness without rebound, otherwise no tenderness Ext: Warm, cap refill < 2 sec.  Laboratory:  Recent Labs Lab 09/27/15 1233 09/27/15 1642 09/28/15 0550  WBC 7.6 9.2 7.3  HGB 10.8* 11.7* 10.5*  HCT 31.5* 34.7* 30.3*  PLT 207 184 173    Recent Labs Lab 09/26/15 0835 09/26/15 2240 09/27/15 1233 09/27/15 1642 09/28/15 0550  NA 140 142 140  --  140  K 4.6 3.4* 3.3*  --  3.5  CL 110 109 107  --  106  CO2 19* 21* 22  --  23  BUN 28* 27* 24*  --  23*  CREATININE 2.16* 2.09* 1.81* 1.87* 2.19*  CALCIUM 9.9 9.8 9.4  --  8.6*  PROT 7.0 8.0 7.6  --   --   BILITOT 0.6 0.7 1.1  --   --  ALKPHOS 75 81 77  --   --   ALT 13* 24 20  --   --   AST 17 34 29  --   --   GLUCOSE 245* 233* 209*  --  144*   ECG 10/1: Significant baseline wander, ST w/o significant ST changes.   Imaging/Diagnostic Tests: Relative paucity of bowel gas. Obstruction not felt to be likely. Moderate stool noted in colon. Lungs clear.  Patrecia Pour, MD 09/28/2015, 8:25 AM PGY-3, [place] Intern pager: [phone],  text pages welcome

## 2015-09-28 NOTE — Discharge Instructions (Signed)
I understand that if any problems occur once I am at home I am to contact my physician.  I understand and acknowledge receipt of the instructions indicated above.    _____________________________________________                                                       Physician's or R.N.'s Signature                Date/Time                        _____________________________________________                                                       Patient or Representative Signature         Date/Time        Activity Instructions   You must avoid lifting more than *** pounds until your physician instructs you differently. You should avoid {d/c avoid/resume:120111}. You may resume {d/c avoid/resume:120111}.

## 2015-09-28 NOTE — Progress Notes (Signed)
Patient c/o abdominal pain 9/10. No pain mediation ordered. Telephone call to MD to discuss patient concerns. Orders to continue to monitor patient and treat symptoms of nausea. Bartholomew Crews, RN

## 2015-09-29 LAB — BASIC METABOLIC PANEL
Anion gap: 5 (ref 5–15)
BUN: 22 mg/dL — AB (ref 6–20)
CHLORIDE: 106 mmol/L (ref 101–111)
CO2: 24 mmol/L (ref 22–32)
CREATININE: 2.07 mg/dL — AB (ref 0.44–1.00)
Calcium: 8.3 mg/dL — ABNORMAL LOW (ref 8.9–10.3)
GFR calc Af Amer: 31 mL/min — ABNORMAL LOW (ref >60)
GFR calc non Af Amer: 27 mL/min — ABNORMAL LOW (ref >60)
Glucose, Bld: 184 mg/dL — ABNORMAL HIGH (ref 65–99)
POTASSIUM: 3.3 mmol/L — AB (ref 3.5–5.1)
Sodium: 135 mmol/L (ref 135–145)

## 2015-09-29 LAB — GLUCOSE, CAPILLARY
GLUCOSE-CAPILLARY: 241 mg/dL — AB (ref 65–99)
Glucose-Capillary: 292 mg/dL — ABNORMAL HIGH (ref 65–99)

## 2015-09-29 LAB — GC/CHLAMYDIA PROBE AMP (~~LOC~~) NOT AT ARMC
Chlamydia: NEGATIVE
NEISSERIA GONORRHEA: NEGATIVE

## 2015-09-29 MED ORDER — PROMETHAZINE HCL 25 MG PO TABS: 12.5000 mg | ORAL_TABLET | ORAL | Status: DC | PRN

## 2015-09-29 MED ORDER — LISINOPRIL 10 MG PO TABS: 40.0000 mg | ORAL_TABLET | Freq: Every day | ORAL | Status: DC

## 2015-09-29 MED ORDER — HEPARIN SOD (PORK) LOCK FLUSH 100 UNIT/ML IV SOLN
500.0000 [IU] | INTRAVENOUS | Status: AC | PRN
  Administered 2015-09-29: 500 [IU]

## 2015-09-29 MED ORDER — PROMETHAZINE HCL 12.5 MG PO TABS: 12.5000 mg | ORAL_TABLET | ORAL | Status: DC | PRN

## 2015-09-29 MED ORDER — LORAZEPAM 0.5 MG PO TABS: 0.5000 mg | ORAL_TABLET | ORAL | Status: DC | PRN

## 2015-09-29 MED ORDER — ENOXAPARIN SODIUM 30 MG/0.3ML ~~LOC~~ SOLN: 30.0000 mg | SUBCUTANEOUS | Status: DC

## 2015-09-29 MED ORDER — CARVEDILOL 25 MG PO TABS: 25.0000 mg | ORAL_TABLET | Freq: Two times a day (BID) | ORAL | Status: DC

## 2015-09-29 MED ORDER — HYDRALAZINE HCL 10 MG PO TABS: 10.0000 mg | ORAL_TABLET | Freq: Three times a day (TID) | ORAL | Status: DC

## 2015-09-29 NOTE — Progress Notes (Signed)
Family Medicine Teaching Service Daily Progress Note Intern Pager: [phone]  Patient name: [name] [name] Medical record number: [id number] Date of birth: 05/21/1966 Age: 49 y.o. Gender: female  Primary Care Provider: Wenda Low, MD Consultants: None Code Status: Full  Pt Overview and Major Events to Date:  10/1: Admitted for CVS, made NPO 10/2: Advance to clear liquid, restart po medications  Assessment and Plan: OZELL LEREW is a 49 y.o. female presenting with nausea and vomiting consistent with cyclic vomiting syndrome. PMH is significant for cyclic vomiting syndrome, marijuana use, HTN, DM, CKD III, gastroparesis, pancreatitis.  Cyclic vomiting syndrome: Relapse in symptoms likely related to recent cessation of marijuana use. Hemodynamically stable with stable AKI, otherwise findings not consistent with significant emesis (no alkalosis or ketonuria). More sinister causes of abdominal pain ruled out. Managed at home with PO Phenergan and Ativan - STOP dilaudid, IVF - TRANSITION Ativan, Phenergan, PPI IV-->PO - CSW for ongoing substance abuse - Recommend PO Phenergan, Ativan, and Benadryl for OP management of future episodes.  Hypertension: Transitioned to home PO medications (Coreg 25 BID, Hydralazine 10mg  TID, Lisinopril 40mg ) - Hydralazine 5mg  IV q4 PRN SBP>180, DBP>100 - D/C telemetry, no events since admission  CKD stage III: Mild AKI, near historical baseline, suspect pre-renal.  - STOP IVF's as above, daily BMP - Baseline Cr ~2, Cr 2.07 today - Restart home lisinopril, f/u with PCP for med check.  Insulin-dependent T2DM:Hb A1c: 7.4% on 05/08/2015.  - Sensitive sliding scale - Restart home lantus at d/c given improved PO intake  Asymptomatic BV: Moderate clue cells  9/30, not thought to be related to abdominal discomfort.  - Hold flagyl, consider tx when acute illness subsides  Anemia: Normocytic/normochromic, likely 2/2 chronic disease, stable since admission at  historical baseline (10 - 12) - Hgb 10.5 today  Hypokalemia: Secondary to GI losses.   - K 3.3 today   - Consider repletion prior to d/c   - Close OP f/u  FEN/GI: Carb modified Prophylaxis: lovenox  Disposition: Home  Subjective:  Pt states she is ready to go home. She says she is feeling well and has not vomited since Saturday. She states she ate dinner last night (50% per review of charting) and most of her breakfast this morning). She states that she will be able to take all her medications by mouth. She denies abdominal pain at rest, states that it is only some mild discomfort with palpation, but that this is normal for her following "an episode" of vomiting.  Objective: Temp:  [98 F (36.7 C)-98.5 F (36.9 C)] 98 F (36.7 C) (10/03 0929) Pulse Rate:  [56-62] 57 (10/03 0929) Resp:  [17-20] 18 (10/03 0929) BP: (109-163)/(48-58) 163/56 mmHg (10/03 0929) SpO2:  [98 %-100 %] 100 % (10/03 0929) Weight:  [144 lb 6.4 oz (65.499 kg)] 144 lb 6.4 oz (65.499 kg) (10/02 2024) Physical Exam: Gen: Chronically ill-appearing 49yo female resting comfortably in bed, eating breakfast, in no distress. CV: Regular rate, no m/r/g Respiratory: Nonlabored on room air, CTAB Abdomen: Soft, mild epigastric tenderness without rebound, otherwise no tenderness Ext: Warm, cap refill < 2 sec.  Laboratory:  Recent Labs Lab 09/27/15 1233 09/27/15 1642 09/28/15 0550  WBC 7.6 9.2 7.3  HGB 10.8* 11.7* 10.5*  HCT 31.5* 34.7* 30.3*  PLT 207 184 173    Recent Labs Lab 09/26/15 0835 09/26/15 2240 09/27/15 1233 09/27/15 1642 09/28/15 0550 09/29/15 0559  NA 140 142 140  --  140 135  K 4.6 3.4*  3.3*  --  3.5 3.3*  CL 110 109 107  --  106 106  CO2 19* 21* 22  --  23 24  BUN 28* 27* 24*  --  23* 22*  CREATININE 2.16* 2.09* 1.81* 1.87* 2.19* 2.07*  CALCIUM 9.9 9.8 9.4  --  8.6* 8.3*  PROT 7.0 8.0 7.6  --   --   --   BILITOT 0.6 0.7 1.1  --   --   --   ALKPHOS 75 81 77  --   --   --   ALT 13* 24  20  --   --   --   AST 17 34 29  --   --   --   GLUCOSE 245* 233* 209*  --  144* 184*   ECG 10/1: Significant baseline wander, ST w/o significant ST changes.   Imaging/Diagnostic Tests: Relative paucity of bowel gas. Obstruction not felt to be likely. Moderate stool noted in colon. Lungs clear.  Ethlyn Gallery, Med Student 09/29/2015, 9:45 AM      Resident Attestation [name] [name] is a 49 y.o. female presenting with nausea and vomiting consistent with cyclic vomiting syndrome. PMH is significant for cyclic vomiting syndrome, marijuana use, HTN, DM, CKD III, gastroparesis, pancreatitis.  S: Pt endorses resolved nausea, vomiting and abd pain, Wishes to go home  Exam:  BP 163/56 mmHg  Pulse 57  Temp(Src) 98 F (36.7 C) (Oral)  Resp 18  Ht 5\' 1"  (1.549 m)  Wt 144 lb 6.4 oz (65.499 kg)  BMI 27.30 kg/m2  SpO2 100%  Gen: NAD CV: RRR Pulm: CTAB Abd: Soft, non tender  A/P Nausea/emesis- resolved - Will continue ativan, phenergan PRN  Hypertension- BP now within nl - Will continue home BP regimen  Hypokalemia- K 3.3, stable likely secondary to GI losses  CKD- Cr 2.07, baseline approx 2  Dispo; Likely home today with [facility] follow up   Vanda Waskey A. Kennon Rounds MD, MS Family Medicine Resident PGY-2 Pager [phone]

## 2015-09-29 NOTE — Progress Notes (Signed)
Patient stated that she had a family emergency and had to leave.  MD notified.  Patient stated that she could not wait for the doctor and signed out AMA.  MD notified.  AMA signed and in patient chart.

## 2015-09-29 NOTE — Care Management Note (Signed)
Case Management Note  Patient Details  Name: [name] [name] MRN: [id number] Date of Birth: 1966-11-28  Subjective/Objective:              CM following for progression and d/c planning.      Action/Plan: No d/c needs identified, pt independent.  Expected Discharge Date:       09/29/2015           Expected Discharge Plan:  Home/Self Care  In-House Referral:  NA  Discharge planning Services  NA  Post Acute Care Choice:  NA Choice offered to:  NA  DME Arranged:    DME Agency:     HH Arranged:    HH Agency:     Status of Service:  Completed, signed off  Medicare Important Message Given:    Date Medicare IM Given:    Medicare IM give by:    Date Additional Medicare IM Given:    Additional Medicare Important Message give by:     If discussed at [place] of Stay Meetings, dates discussed:    Additional Comments:  Adron Bene, RN 09/29/2015, 11:09 AM

## 2015-09-29 NOTE — Progress Notes (Signed)
Inpatient Diabetes Program Recommendations  AACE/ADA: New Consensus Statement on Inpatient Glycemic Control (2015)  Target Ranges:  Prepandial:   less than 140 mg/dL      Peak postprandial:   less than 180 mg/dL (1-2 hours)      Critically ill patients:  140 - 180 mg/dL  Results for [name] [name], [name] [name] (MRN [id number]) as of 09/29/2015 08:27  Ref. Range 09/28/2015 07:52 09/28/2015 11:35 09/28/2015 20:23 09/29/2015 07:52  Glucose-Capillary Latest Ref Range: 65-99 mg/dL 117 (H) 107 (H) 357 (H) 241 (H)   Review of Glycemic Control  Diabetes history: DM2 Outpatient Diabetes medications: Lantus 22 units daily Current orders for Inpatient glycemic control: Novolog 0-15 units TID with meals, Novolog 0-5 units HS  Inpatient Diabetes Program Recommendations: IV fluids: Noted diet advanced to Carb Modified diet this morning. If patient is tolerating diet, please re-evaluate need for D5 1/2 NS at 100 ml/hr. Insulin - Basal: Fasting glucose noted to be 241 mg/dl this morning. Please consider ordering low dose basal insulin. Recommend ordering Lantus 7 units daily (based on 65.4 kg x 0.1 units).  Thanks, Barnie Alderman, RN, MSN, CCRN, CDE Diabetes Coordinator Inpatient Diabetes Program [phone] (Team Pager from [place] to [place]) [phone] ([facility] office) [phone] [facility] office) [phone] [facility] office)

## 2015-09-29 NOTE — Discharge Summary (Signed)
Physician Discharge Summary  Patient ID: [name] [name] MRN: [id number] DOB/AGE: 08/31/1966 49 y.o.  Admit date: 09/27/2015 Discharge date: 09/29/2015  Admission Diagnoses: Cyclic vomiting syndrome, acute episode   Discharge Diagnoses: same  Hospital Course: [name] [name] is a 49yo F who presented to the ED with one day of nausea and vomiting consistent with cyclic vomiting syndrome. PMH is significant for cyclic vomiting syndrome, marijuana use, HTN, DM, CKD III, gastroparesis, pancreatitis. Patient states that her acute episodes are usually associated with attempts at marijuana cessation. She was treated with IV forms of her home nausea meds (Phenergan and Ativan) as well as metoclopramide, started on pantoprazole and IVFs, and made NPO. Hypokalemia to 3.3 was noted on admission, likely 2/2 PO intolerance, and remained stable throughout hospital course. Patient also presented with worsening of her hypertension, up to 226/93 in the ED, likely 2/2 medication noncompliance from PO intolerance. Home oral antihypertensives were held and patient was managed on Lorpressor and hydralazine prn. Home lantus was held given NPO, and patient was managed on SSI. Recently prescribed metronidazole for bacterial vaginosis (asymptomatic) was held given GI upset, to resume as an outpatient. Clinical picture and abdominal XR not concerning for obstruction. Patient was transitioned to PO by HD#2 without continued vomiting. By HD#3, patient's symptoms had completely resolved, was tolerating a full diet without N/V, and patient left during rounds as we were preparing her discharge paperwork.  Issues for follow-up: 1. Hypokalemia. K on morning of discharge was 3.3, stable from admission. Suspect this was 2/2 emesis and would expect this to resolve given patient's improved PO intake. Recommend OP f/u. 2. Asymptomatic BV, diagnosed on 9/30. Patient OK to resume OP metronidazole as tolerated. 3. Hypertension. Resume home  Coreg 25 BID, Hydralazine 10mg  TID, lisinopril 40mg  qd. 4. DM type 2. Last A1C of 7.4 on 05/08/2015. Resume home lantus, patient is close to goal of A1C <7. 5. CKD3. Creatinine remained around baseline of 2. OK to continue lisinopril renal dosing. 6. Health maintenance in pt with DM2 and CKD. Influenza vaccine completed this admission. Review need for PCV13, PPSV23 as outpatient (Pneumococcal unspecified documented in 2010).  Discharge Exam: Blood pressure 163/56, pulse 57, temperature 98 F (36.7 C), temperature source Oral, resp. rate 18, height 5\' 1"  (1.549 m), weight 144 lb 6.4 oz (65.499 kg), SpO2 100 %. General appearance: alert, appears stated age and no distress Resp: clear to auscultation bilaterally Cardio: regular rate and rhythm, S1, S2 normal, no murmur, click, rub or gallop GI: Soft, mildly tender to palpation in epigastric area, no rebound  Disposition: 07-Left Against Medical Advice     Medication List    TAKE these medications        aspirin EC 81 MG tablet  Take 1 tablet (81 mg total) by mouth daily.     blood glucose meter kit and supplies Kit  Dispense based on patient and insurance preference. Use up to four times daily as directed. (FOR ICD-9 250.00, 250.01).     carvedilol 25 MG tablet  Commonly known as:  COREG  Take 1 tablet (25 mg total) by mouth 2 (two) times daily with a meal.     carvedilol 25 MG tablet  Commonly known as:  COREG  Take 1 tablet (25 mg total) by mouth 2 (two) times daily with a meal.     cetirizine 10 MG tablet  Commonly known as:  ZYRTEC  Take 1 tablet (10 mg total) by mouth daily.     diazepam 5  MG tablet  Commonly known as:  VALIUM  Take 1 tablet (5 mg total) by mouth 2 (two) times daily.     hydrALAZINE 10 MG tablet  Commonly known as:  APRESOLINE  Take 1 tablet (10 mg total) by mouth 3 (three) times daily.     hydrALAZINE 10 MG tablet  Commonly known as:  APRESOLINE  Take 1 tablet (10 mg total) by mouth 3 (three) times  daily.     insulin glargine 100 UNIT/ML injection  Commonly known as:  LANTUS  Inject 0.22 mLs (22 Units total) into the skin daily.     lisinopril 40 MG tablet  Commonly known as:  PRINIVIL,ZESTRIL  Take 1 tablet (40 mg total) by mouth daily.     lisinopril 10 MG tablet  Commonly known as:  PRINIVIL  Take 4 tablets (40 mg total) by mouth daily.     LORazepam 1 MG tablet  Commonly known as:  ATIVAN  Take 1 tablet (1 mg total) by mouth 2 (two) times daily as needed (cyclic vomiting symptoms).     LORazepam 0.5 MG tablet  Commonly known as:  ATIVAN  Take 1 tablet (0.5 mg total) by mouth every 4 (four) hours as needed (nausea, vomiting).     metroNIDAZOLE 500 MG tablet  Commonly known as:  FLAGYL  Take 1 tablet (500 mg total) by mouth 2 (two) times daily.     oxyCODONE 5 MG immediate release tablet  Commonly known as:  Oxy IR/ROXICODONE  Take one tab by mouth up to twice a day as needed for severe pain     polyethylene glycol packet  Commonly known as:  MIRALAX / GLYCOLAX  Take 17 g by mouth daily as needed. May increase in 2-3 days to 2 times daily, Until stooling regularly     pregabalin 25 MG capsule  Commonly known as:  LYRICA  TAKE 1 TABLET BY MOUTH AT BEDTIME AS NEEDED     promethazine 12.5 MG tablet  Commonly known as:  PHENERGAN  Take 1 tablet (12.5 mg total) by mouth every 6 (six) hours as needed for nausea or vomiting.     promethazine 25 MG suppository  Commonly known as:  PHENERGAN  Place 1 suppository (25 mg total) rectally every 6 (six) hours as needed for nausea or vomiting.     promethazine 12.5 MG tablet  Commonly known as:  PHENERGAN  Take 1 tablet (12.5 mg total) by mouth every 4 (four) hours as needed for nausea or vomiting.           Follow-up Information    Follow up with Tawni Carnes, MD On 10/06/2015.   Specialty:  Family Medicine   Why:  9:45 am   Contact information:   [address] [place] [address] [phone]        Signed: Ethlyn Gallery, MS4 09/29/2015, 12:22 PM   I have seen and examined the patient. I have read and agree with the above note.    Soila Printup A. Kennon Rounds MD, MS Family Medicine Resident PGY-2 Pager [phone]

## 2015-10-06 ENCOUNTER — Ambulatory Visit: Payer: Self-pay | Admitting: Family Medicine

## 2015-10-15 ENCOUNTER — Ambulatory Visit (HOSPITAL_COMMUNITY)
Admission: RE | Admit: 2015-10-15 | Discharge: 2015-10-15 | Disposition: A | Discharge status: Home / Self Care | Payer: Medicare Other | Source: Ambulatory Visit | Attending: Family Medicine | Admitting: Family Medicine

## 2015-10-15 ENCOUNTER — Encounter: Payer: Self-pay | Admitting: Internal Medicine

## 2015-10-15 ENCOUNTER — Ambulatory Visit (INDEPENDENT_AMBULATORY_CARE_PROVIDER_SITE_OTHER): Payer: Medicare Other | Admitting: Internal Medicine

## 2015-10-15 VITALS — BP 130/72 | HR 53 | Temp 97.7°F | Ht 61.0 in | Wt 148.0 lb

## 2015-10-15 DIAGNOSIS — R252 Cramp and spasm: Secondary | ICD-10-CM | POA: Diagnosis not present

## 2015-10-15 DIAGNOSIS — M79662 Pain in left lower leg: Secondary | ICD-10-CM | POA: Diagnosis not present

## 2015-10-15 DIAGNOSIS — IMO0002 Reserved for concepts with insufficient information to code with codable children: Secondary | ICD-10-CM

## 2015-10-15 DIAGNOSIS — F411 Generalized anxiety disorder: Secondary | ICD-10-CM | POA: Diagnosis not present

## 2015-10-15 DIAGNOSIS — E1322 Other specified diabetes mellitus with diabetic chronic kidney disease: Secondary | ICD-10-CM

## 2015-10-15 DIAGNOSIS — I10 Essential (primary) hypertension: Secondary | ICD-10-CM | POA: Diagnosis not present

## 2015-10-15 DIAGNOSIS — G43A0 Cyclical vomiting, not intractable: Secondary | ICD-10-CM

## 2015-10-15 DIAGNOSIS — N183 Chronic kidney disease, stage 3 (moderate): Secondary | ICD-10-CM

## 2015-10-15 DIAGNOSIS — E876 Hypokalemia: Secondary | ICD-10-CM

## 2015-10-15 DIAGNOSIS — R1115 Cyclical vomiting syndrome unrelated to migraine: Secondary | ICD-10-CM

## 2015-10-15 DIAGNOSIS — E1365 Other specified diabetes mellitus with hyperglycemia: Secondary | ICD-10-CM

## 2015-10-15 LAB — BASIC METABOLIC PANEL
BUN: 41 mg/dL — AB (ref 7–25)
CALCIUM: 9.2 mg/dL (ref 8.6–10.4)
CO2: 24 mmol/L (ref 20–31)
Chloride: 105 mmol/L (ref 98–110)
Creat: 2.44 mg/dL — ABNORMAL HIGH (ref 0.50–1.05)
GLUCOSE: 344 mg/dL — AB (ref 65–99)
POTASSIUM: 5.2 mmol/L (ref 3.5–5.3)
Sodium: 135 mmol/L (ref 135–146)

## 2015-10-15 LAB — MAGNESIUM: MAGNESIUM: 2 mg/dL (ref 1.5–2.5)

## 2015-10-15 LAB — TSH: TSH: 0.77 u[IU]/mL (ref 0.350–4.500)

## 2015-10-15 LAB — CK: CK TOTAL: 99 U/L (ref 7–177)

## 2015-10-15 LAB — POCT GLYCOSYLATED HEMOGLOBIN (HGB A1C): Hemoglobin A1C: 9.6

## 2015-10-15 MED ORDER — LORAZEPAM 1 MG PO TABS: 1.0000 mg | ORAL_TABLET | Freq: Two times a day (BID) | ORAL | Status: DC | PRN

## 2015-10-15 MED ORDER — SITAGLIPTIN PHOSPHATE 25 MG PO TABS
25.0000 mg | ORAL_TABLET | Freq: Every day | ORAL | Status: DC
Start: 2015-10-15 — End: 2015-11-15

## 2015-10-15 NOTE — Progress Notes (Signed)
Patient ID: [name] [name], female   DOB: [birth year], 49 y.o.   MRN: [id number] Subjective:   CC: hospital follow up   HPI:  Patient presented to ED with N/V 2/2 cyclic vomiting syndrome. She has a history of marijuana use. She was given IVF and IV antiemetics initially which improved symptoms. She was also noted to have hypokalemia likely due to PO intolerance (K ranged from 3.3-3.5; with 3.3 on day of discharge). Also noted to have elevated BP in the ED up to 226/93 likely due to noncompliance.   Cyclic Vomiting: - denies vomiting or nausea - states she was prescribed Valium which is not tolerating as it causes her to be drowsy. Would prefer to go back to Ativan which she uses for anxiety and nausesa  HTN: - has been taking Lisinopril and Coreg. She was unaware she is to also take Hydralazine as she left the hospital AMA prior to her discharge.  - does not check BP at home - Denies chest pain, SOB, HA, blurred vision - notes of LE edema that resolves with elevation  DM:  - has been taking Lantus 22 units at bedtime. Missed dose for 3-4 days a week prior to hospitalization but has been taking it regularly since then - does not check CBG at home - has not been to the eye doctor this year   Nocturnal Cramping: - notes of nocturnal leg cramping for the past week; does not bother her during the day - this affects her sleep  - no LE weakness; no numbness or tingling; no urine incontinence or retention - notes of past history of this when she was on Novolog which resolved on discontinuation of this medication - notes of some LE swelling that improves with elevation  - no history of DVT, prolonged bed rest, no personal history of malignancy   Review of Systems - Per HPI.   PMH, FH, or SH Smoking status: current some day smoker     Objective:  Physical Exam There were no vitals taken for this visit. GEN: NAD HEENT: Atraumatic, normocephalic, neck supple, EOMI, sclera clear  CV: RRR,  no murmurs, rubs, or gallops PULM: CTAB, normal effort ABD: Soft, nontender, nondistended, NABS, no organomegaly SKIN: No rash or cyanosis; warm and well-perfused EXTR: No lower extremity edema; bilateral calf tenderness however there is localized tenderness in her left calf; no asymmetry of calves; noted collateral superificial veins noted bilaterally in LE.  PSYCH: Mood and affect euthymic, normal rate and volume of speech NEURO: Awake, alert, no focal deficits grossly; normal strength in upper and lower extremities; normal sensation to light touch in upper and lower extremities, normal speech    Assessment:     [name] [name] is a 49 y.o. female with ho cyclic vomiting syndrome and marijuana use here for hospital follow up.     Plan:     # See problem list and after visit summary for problem-specific plans.  Follow-up: Follow up in 2 weeks for nurse visit to check BP; follow up with PCP for well woman visit.   Smiley Houseman, MD [place]

## 2015-10-15 NOTE — Assessment & Plan Note (Signed)
Here for hospital follow up for cyclic vomiting. Denies N/V. Prefers Ativan over Diazepam for nausea and anxiety. She usually takes one tablet daily.  - Discontinued Valium prescribed at discharge - Refilled Ativan 1mg  BID PRN #30

## 2015-10-15 NOTE — Assessment & Plan Note (Signed)
BP 130/72. Seems to be stable on Lisinopril and Coreg; patient has not been taking Hydralazine as prescribed previously.  - continue Coreg and Lisinopril  - asked patient to hold Hydralazine since BP is stable at this point - follow up nurse visit in 2 weeks to check BP

## 2015-10-15 NOTE — Patient Instructions (Signed)
Thank you for coming - Discontinue Diazepam. I have included a prescription for Ativan - Please go to [facility] to get [place] of your left leg to rule out a blood clot - please make an appointment with Dr. Berkley Harvey in a few weeks for a well woman visit - Please hold off taking Hydralizine because your blood pressure looks good today. Please make a nurse visit in 2 weeks to get your blood pressure checked.  - we will get blood work to find out why you are having cramps

## 2015-10-15 NOTE — Progress Notes (Signed)
Preliminary results by tech - Left Lower Ext. Venous Duplex Completed. Negative for deep and superficial vein thrombosis in the left leg. Keiondra Brookover, BS, RDMS, RVT  

## 2015-10-15 NOTE — Assessment & Plan Note (Signed)
Nocturnal Leg Cramping. Uncertain in etiology. Possible factors include thyroid dysfunction, electrolyte imbalance, metabolic dysfunction. No inciting event. No erythema or edema. Mild tenderness to palpation, however there was localized tenderness noted on left medical calf. No RF for DVT, however Wells Criteria for DVT was 2 with localized tenderness and presence of collateral superficial veins. Normal reflexes, sensation, and strength - BMP, Magnesium, CK, and TSH ordered  - will obtain Duplex venous US of left LE

## 2015-10-15 NOTE — Assessment & Plan Note (Addendum)
A1c on May 2016 7.4. Patient states she is taking Lantus 22 units regularly. Does not check CBGs at home. A1c ordered during this visit and resulted 9.6 after patient left clinic. Attempted to call patient and left voicemail to call back clinic.  - since patient is not checking CBGs at home, hesitant to increase Lantus - per chart review patient has not tolerated Metformin in the past - will send prescription to pharmacy for Januvia 25mg  daily ( as calculated creatinine clearance 34.5 however is 28.5 when modified for her BMI).  Attempted to call patient and left voicemail to call back clinic. Will inform nursing staff regarding this new prescription if/when patient calls back. - encouraged patient to check fasting CBG daily and keep log

## 2015-10-23 ENCOUNTER — Telehealth: Payer: Self-pay | Admitting: Internal Medicine

## 2015-10-23 NOTE — Telephone Encounter (Signed)
Unable to get in contact with patient (number listed on chart goes to voicemail). Additionally, a voicemail was left on patient's contact number listed on chart a few days ago;however, the patient has not returned the call. Spoke with [name] [name], her sister who does not have a contact number for patient, however states that she will likely see her sister this weekend. I asked if she could inform [name] to call the clinic and provide [place] with a updated contact number for her. JenniferHuerta agreed.    I did not inform sister about patient's lab results.

## 2015-10-29 ENCOUNTER — Ambulatory Visit (INDEPENDENT_AMBULATORY_CARE_PROVIDER_SITE_OTHER): Payer: Medicare Other | Admitting: *Deleted

## 2015-10-29 VITALS — BP 150/78 | HR 60

## 2015-10-29 DIAGNOSIS — Z0130 Encounter for examination of blood pressure without abnormal findings: Secondary | ICD-10-CM

## 2015-10-29 DIAGNOSIS — Z136 Encounter for screening for cardiovascular disorders: Secondary | ICD-10-CM

## 2015-10-29 DIAGNOSIS — I10 Essential (primary) hypertension: Secondary | ICD-10-CM

## 2015-10-29 NOTE — Progress Notes (Signed)
  Patient in nurse clinic for blood pressure check.  Patient's blood pressure 158/80 and 150/78 manually.  Patient stated she is hurting from her lower back down to her hips.  Patient has had an X-ray already, unsure of the results.  Patient stated her pain verifies from day to day, which she does not have any pain medication.  She also stated he is having headaches in back lower part of her head.  She stated she can not take any over the counter medications.  Advised patient that she need to schedule an appointment with PCP to discuss her pain and blood pressure.  Patient stated she prefer to see Dr. Lincoln Brigham.  Appointment scheduled for 11/04/15 at 3:15 PM.  Patient informed to call clinic or go to ED is she develops chest pain, SOB, dizziness, visual changes, n/v, abdominal pain or headache or pain worsen.  Patient stated understanding.  Derl Barrow, RN

## 2015-11-04 ENCOUNTER — Ambulatory Visit (INDEPENDENT_AMBULATORY_CARE_PROVIDER_SITE_OTHER): Payer: Medicare Other | Admitting: Student

## 2015-11-04 ENCOUNTER — Encounter: Payer: Self-pay | Admitting: Student

## 2015-11-04 VITALS — BP 124/64 | HR 55 | Temp 98.2°F | Ht 61.0 in | Wt 148.0 lb

## 2015-11-04 DIAGNOSIS — Z7189 Other specified counseling: Secondary | ICD-10-CM | POA: Diagnosis not present

## 2015-11-04 DIAGNOSIS — M549 Dorsalgia, unspecified: Secondary | ICD-10-CM | POA: Diagnosis not present

## 2015-11-04 DIAGNOSIS — G8929 Other chronic pain: Secondary | ICD-10-CM

## 2015-11-04 DIAGNOSIS — F1220 Cannabis dependence, uncomplicated: Secondary | ICD-10-CM | POA: Diagnosis not present

## 2015-11-04 DIAGNOSIS — I10 Essential (primary) hypertension: Secondary | ICD-10-CM

## 2015-11-04 DIAGNOSIS — F1290 Cannabis use, unspecified, uncomplicated: Secondary | ICD-10-CM

## 2015-11-04 MED ORDER — OXYCODONE HCL 5 MG PO TABS: 5.0000 mg | ORAL_TABLET | ORAL | Status: DC | PRN

## 2015-11-04 NOTE — Assessment & Plan Note (Signed)
BP normal today with current regimen of lisinopril and Coreg - continue to hold hydralazine - continue to monitor

## 2015-11-04 NOTE — Progress Notes (Signed)
Subjective:    Patient ID: [name] [name], female    DOB: Jul 09, 1966, 49 y.o.   MRN: [id number]   CC: BP follow up, back pain  HPI 49 y/o with h/o chronic back pain here for BP follow up and back pain   BP -On last visit blood pressure was noted to be normal with only Coreg and lisinopril. Pt was not compliant with prescribed hydralazine - initial BP reading 137/119 today but recheck was 124/64 - Pt reports compliance with lisinopril and coreg regimen - Denies HA/chest pain or SOB -  Back pain - Report long history of low back pain and left hip pain - started to get worse over the last few weeks  - denies radiation of the pain down her legs, numbness or tingling, bowel or bladder incontinence  Marijuana Use - Pt has cyclic vomiting with cessation of marijuana - currentl;y smokes one joint per day - is interested in stopping smoking, although is not sure that her smoking is related to her marijuana use   Review of Systems   See HPI for ROS.   Past Medical History  Diagnosis Date  . PANCREATITIS 05/09/2008  . Cyclic vomiting syndrome   . Hypertension   . Complication of anesthesia     "problems waking up"  . Heart murmur   . Myocardial infarction [facility]) 07/2007    "they say I've had a silent one; I don't know"  . Pneumonia   . Anemia   . Blood transfusion   . GERD (gastroesophageal reflux disease)   . Anxiety   . Depression   . Smoker   . Marijuana smoker, continuous ([place])   . NEUROPATHY, DIABETIC 02/23/2007  . Type II diabetes mellitus ([place])   . Diabetic gastroparesis ([place])     /e-chart  . Chronic kidney disease (CKD), stage III (moderate)   . Nausea & vomiting 06/10/2015  . Intestinal impaction [facility])    Past Surgical History  Procedure Laterality Date  . Port-a-cath placement  ~ 2008    right chest; "poor access; frequent hospitalizations"  . Laparotomy and lysis of adhesions    . Abdominal hysterectomy  02/2002  . Cholecystectomy  1980's  . Cardiac  catheterization    . Dilation and evacuation  X3  . Left hand surgery    . Cataract extraction w/ intraocular lens  implant, bilateral    . Colonoscopy    . Upper gi endoscopy    . Cesarean section     OB History    Gravida Para Term Preterm AB TAB SAB Ectopic Multiple Living   7    3 3    4      Social History   Social History  . Marital Status: Single    Spouse Name: N/A  . Number of Children: N/A  . Years of Education: N/A   Occupational History  . Not on file.   Social History Main Topics  . Smoking status: Current Some Day Smoker -- 0.50 packs/day for 22 years    Types: Cigarettes  . Smokeless tobacco: Never Used  . Alcohol Use: No  . Drug Use: Yes    Special: Marijuana     Comment: Current Daily to weekly use  . Sexual Activity: Yes    Birth Control/ Protection: None   Other Topics Concern  . Not on file   Social History Narrative   Lives in [place] - some minimal family support   Has 10 grandchildren   Cares for  her Dog [name] (Mixed female [place] and Cockerspaniel)     Objective:  BP 124/64 mmHg  Pulse 55  Temp(Src) 98.2 F (36.8 C) (Oral)  Ht 5\' 1"  (1.549 m)  Wt 148 lb (67.132 kg)  BMI 27.98 kg/m2 Vitals and nursing note reviewed  General: NAD Cardiac: RRR, Respiratory: CTAB, MSK: no tenderness to palpation but pt reports soreness over sacral spine, 5/5 LE strength, normal flexion and extendion at the hip, knee and ankle Neuro: alert and oriented, no focal deficits   Assessment & Plan:    Encounter for chronic pain management Chronic back pain, previously managed with oxycodone, last received in 04/2015. But now with worsening of the same pain - Will give #5 oxycodone and use this to bridge to PT therapy - PT referral today, this will likely be the most beneficial for her chronic back pain - Discussed that increasing activity as tolerated to improve back pain  Marijuana smoker Discussed slow wean of marijuana use as tolerated to  reduce risk of cyclic vomiting Continue to follow  Essential hypertension, malignant BP normal today with current regimen of lisinopril and Coreg - continue to hold hydralazine - continue to monitor     Alyssa A. Lincoln Brigham MD, [place] [facility] Resident PGY-1 Pager [phone]

## 2015-11-04 NOTE — Patient Instructions (Signed)
Follow up in 3 months Call teh  offcie with questions or concerns Referral was made for physical therapy for your back pain Please try to wean Marijuana use slowly to reduce your risk of vomiting

## 2015-11-04 NOTE — Assessment & Plan Note (Signed)
Discussed slow wean of marijuana use as tolerated to reduce risk of cyclic vomiting Continue to follow

## 2015-11-04 NOTE — Assessment & Plan Note (Addendum)
Chronic back pain, previously managed with oxycodone, last received in 04/2015. But now with worsening of the same pain - Will give #5 oxycodone and use this to bridge to PT therapy - PT referral today, this will likely be the most beneficial for her chronic back pain - Discussed that increasing activity as tolerated to improve back pain

## 2015-11-11 ENCOUNTER — Encounter: Payer: Self-pay | Admitting: Family Medicine

## 2015-11-12 ENCOUNTER — Emergency Department (HOSPITAL_COMMUNITY)
Admission: EM | Admit: 2015-11-12 | Discharge: 2015-11-12 | Disposition: A | Discharge status: Home / Self Care | Payer: Medicare Other | Source: Home / Self Care | Attending: Emergency Medicine | Admitting: Emergency Medicine

## 2015-11-12 ENCOUNTER — Encounter (HOSPITAL_COMMUNITY): Payer: Self-pay | Admitting: *Deleted

## 2015-11-12 DIAGNOSIS — Z79899 Other long term (current) drug therapy: Secondary | ICD-10-CM | POA: Diagnosis not present

## 2015-11-12 DIAGNOSIS — K3184 Gastroparesis: Secondary | ICD-10-CM | POA: Diagnosis not present

## 2015-11-12 DIAGNOSIS — Z8719 Personal history of other diseases of the digestive system: Secondary | ICD-10-CM | POA: Insufficient documentation

## 2015-11-12 DIAGNOSIS — F17210 Nicotine dependence, cigarettes, uncomplicated: Secondary | ICD-10-CM | POA: Insufficient documentation

## 2015-11-12 DIAGNOSIS — I252 Old myocardial infarction: Secondary | ICD-10-CM

## 2015-11-12 DIAGNOSIS — Z9049 Acquired absence of other specified parts of digestive tract: Secondary | ICD-10-CM | POA: Diagnosis not present

## 2015-11-12 DIAGNOSIS — F419 Anxiety disorder, unspecified: Secondary | ICD-10-CM | POA: Insufficient documentation

## 2015-11-12 DIAGNOSIS — I129 Hypertensive chronic kidney disease with stage 1 through stage 4 chronic kidney disease, or unspecified chronic kidney disease: Secondary | ICD-10-CM

## 2015-11-12 DIAGNOSIS — M545 Low back pain: Secondary | ICD-10-CM

## 2015-11-12 DIAGNOSIS — N183 Chronic kidney disease, stage 3 (moderate): Secondary | ICD-10-CM | POA: Insufficient documentation

## 2015-11-12 DIAGNOSIS — Z79891 Long term (current) use of opiate analgesic: Secondary | ICD-10-CM | POA: Diagnosis not present

## 2015-11-12 DIAGNOSIS — E1142 Type 2 diabetes mellitus with diabetic polyneuropathy: Secondary | ICD-10-CM | POA: Diagnosis not present

## 2015-11-12 DIAGNOSIS — K0889 Other specified disorders of teeth and supporting structures: Secondary | ICD-10-CM | POA: Diagnosis not present

## 2015-11-12 DIAGNOSIS — E1140 Type 2 diabetes mellitus with diabetic neuropathy, unspecified: Secondary | ICD-10-CM

## 2015-11-12 DIAGNOSIS — R110 Nausea: Secondary | ICD-10-CM | POA: Diagnosis not present

## 2015-11-12 DIAGNOSIS — R011 Cardiac murmur, unspecified: Secondary | ICD-10-CM

## 2015-11-12 DIAGNOSIS — G43A0 Cyclical vomiting, not intractable: Secondary | ICD-10-CM | POA: Insufficient documentation

## 2015-11-12 DIAGNOSIS — Z862 Personal history of diseases of the blood and blood-forming organs and certain disorders involving the immune mechanism: Secondary | ICD-10-CM | POA: Insufficient documentation

## 2015-11-12 DIAGNOSIS — F1210 Cannabis abuse, uncomplicated: Secondary | ICD-10-CM | POA: Diagnosis not present

## 2015-11-12 DIAGNOSIS — E1143 Type 2 diabetes mellitus with diabetic autonomic (poly)neuropathy: Secondary | ICD-10-CM | POA: Insufficient documentation

## 2015-11-12 DIAGNOSIS — G8929 Other chronic pain: Secondary | ICD-10-CM

## 2015-11-12 DIAGNOSIS — E1122 Type 2 diabetes mellitus with diabetic chronic kidney disease: Secondary | ICD-10-CM | POA: Diagnosis not present

## 2015-11-12 DIAGNOSIS — Z794 Long term (current) use of insulin: Secondary | ICD-10-CM | POA: Insufficient documentation

## 2015-11-12 DIAGNOSIS — K219 Gastro-esophageal reflux disease without esophagitis: Secondary | ICD-10-CM | POA: Diagnosis not present

## 2015-11-12 DIAGNOSIS — R1013 Epigastric pain: Secondary | ICD-10-CM | POA: Insufficient documentation

## 2015-11-12 DIAGNOSIS — E871 Hypo-osmolality and hyponatremia: Secondary | ICD-10-CM | POA: Diagnosis not present

## 2015-11-12 DIAGNOSIS — N179 Acute kidney failure, unspecified: Secondary | ICD-10-CM | POA: Diagnosis not present

## 2015-11-12 DIAGNOSIS — N3090 Cystitis, unspecified without hematuria: Secondary | ICD-10-CM | POA: Diagnosis not present

## 2015-11-12 DIAGNOSIS — F329 Major depressive disorder, single episode, unspecified: Secondary | ICD-10-CM | POA: Diagnosis not present

## 2015-11-12 DIAGNOSIS — Z8701 Personal history of pneumonia (recurrent): Secondary | ICD-10-CM | POA: Insufficient documentation

## 2015-11-12 DIAGNOSIS — Z7982 Long term (current) use of aspirin: Secondary | ICD-10-CM | POA: Diagnosis not present

## 2015-11-12 DIAGNOSIS — E869 Volume depletion, unspecified: Secondary | ICD-10-CM | POA: Diagnosis not present

## 2015-11-12 DIAGNOSIS — R112 Nausea with vomiting, unspecified: Secondary | ICD-10-CM | POA: Diagnosis not present

## 2015-11-12 DIAGNOSIS — R1115 Cyclical vomiting syndrome unrelated to migraine: Secondary | ICD-10-CM

## 2015-11-12 LAB — URINE MICROSCOPIC-ADD ON
Bacteria, UA: NONE SEEN
WBC UA: NONE SEEN WBC/hpf (ref 0–5)

## 2015-11-12 LAB — CBG MONITORING, ED
GLUCOSE-CAPILLARY: 284 mg/dL — AB (ref 65–99)
Glucose-Capillary: 381 mg/dL — ABNORMAL HIGH (ref 65–99)

## 2015-11-12 LAB — COMPREHENSIVE METABOLIC PANEL
ALK PHOS: 91 U/L (ref 38–126)
ALT: 17 U/L (ref 14–54)
AST: 27 U/L (ref 15–41)
Albumin: 4.5 g/dL (ref 3.5–5.0)
Anion gap: 17 — ABNORMAL HIGH (ref 5–15)
BUN: 41 mg/dL — ABNORMAL HIGH (ref 6–20)
CALCIUM: 9.9 mg/dL (ref 8.9–10.3)
CO2: 18 mmol/L — ABNORMAL LOW (ref 22–32)
CREATININE: 2.84 mg/dL — AB (ref 0.44–1.00)
Chloride: 106 mmol/L (ref 101–111)
GFR, EST AFRICAN AMERICAN: 21 mL/min — AB (ref >60)
GFR, EST NON AFRICAN AMERICAN: 18 mL/min — AB (ref >60)
Glucose, Bld: 435 mg/dL — ABNORMAL HIGH (ref 65–99)
Potassium: 4.5 mmol/L (ref 3.5–5.1)
Sodium: 141 mmol/L (ref 135–145)
TOTAL PROTEIN: 7.5 g/dL (ref 6.5–8.1)
Total Bilirubin: 0.9 mg/dL (ref 0.3–1.2)

## 2015-11-12 LAB — CBC
HCT: 32.8 % — ABNORMAL LOW (ref 36.0–46.0)
Hemoglobin: 11.3 g/dL — ABNORMAL LOW (ref 12.0–15.0)
MCH: 31.1 pg (ref 26.0–34.0)
MCHC: 34.5 g/dL (ref 30.0–36.0)
MCV: 90.4 fL (ref 78.0–100.0)
PLATELETS: 191 10*3/uL (ref 150–400)
RBC: 3.63 MIL/uL — AB (ref 3.87–5.11)
RDW: 14 % (ref 11.5–15.5)
WBC: 14.2 10*3/uL — AB (ref 4.0–10.5)

## 2015-11-12 LAB — I-STAT CHEM 8, ED
BUN: 46 mg/dL — AB (ref 6–20)
CALCIUM ION: 1.19 mmol/L (ref 1.12–1.23)
Chloride: 103 mmol/L (ref 101–111)
Creatinine, Ser: 2.8 mg/dL — ABNORMAL HIGH (ref 0.44–1.00)
Glucose, Bld: 492 mg/dL — ABNORMAL HIGH (ref 65–99)
HEMATOCRIT: 37 % (ref 36.0–46.0)
HEMOGLOBIN: 12.6 g/dL (ref 12.0–15.0)
Potassium: 3.8 mmol/L (ref 3.5–5.1)
SODIUM: 142 mmol/L (ref 135–145)
TCO2: 24 mmol/L (ref 0–100)

## 2015-11-12 LAB — URINALYSIS, ROUTINE W REFLEX MICROSCOPIC
Bilirubin Urine: NEGATIVE
KETONES UR: 15 mg/dL — AB
LEUKOCYTES UA: NEGATIVE
Nitrite: NEGATIVE
PH: 5 (ref 5.0–8.0)
Protein, ur: >300 mg/dL — AB
Specific Gravity, Urine: 1.021 (ref 1.005–1.030)

## 2015-11-12 LAB — LIPASE, BLOOD: Lipase: 35 U/L (ref 11–51)

## 2015-11-12 MED ORDER — GI COCKTAIL ~~LOC~~
30.0000 mL | Freq: Once | ORAL | Status: AC
  Administered 2015-11-12: 30 mL via ORAL
  Filled 2015-11-12: qty 30

## 2015-11-12 MED ORDER — LORAZEPAM 2 MG/ML IJ SOLN
1.0000 mg | Freq: Once | INTRAMUSCULAR | Status: AC
  Administered 2015-11-12: 1 mg via INTRAVENOUS
  Filled 2015-11-12: qty 1

## 2015-11-12 MED ORDER — ONDANSETRON HCL 4 MG/2ML IJ SOLN
4.0000 mg | Freq: Once | INTRAMUSCULAR | Status: AC
  Administered 2015-11-12: 4 mg via INTRAVENOUS
  Filled 2015-11-12: qty 2

## 2015-11-12 MED ORDER — INSULIN ASPART 100 UNIT/ML ~~LOC~~ SOLN
8.0000 [IU] | Freq: Once | SUBCUTANEOUS | Status: AC
  Administered 2015-11-12: 8 [IU] via SUBCUTANEOUS
  Filled 2015-11-12: qty 1

## 2015-11-12 MED ORDER — HEPARIN SOD (PORK) LOCK FLUSH 100 UNIT/ML IV SOLN
500.0000 [IU] | Freq: Once | INTRAVENOUS | Status: AC
  Administered 2015-11-12: 500 [IU]
  Filled 2015-11-12: qty 5

## 2015-11-12 MED ORDER — HYDROMORPHONE HCL 1 MG/ML IJ SOLN
1.0000 mg | Freq: Once | INTRAMUSCULAR | Status: AC
  Administered 2015-11-12: 1 mg via INTRAVENOUS
  Filled 2015-11-12: qty 1

## 2015-11-12 NOTE — ED Notes (Signed)
PA at bedside.

## 2015-11-12 NOTE — ED Notes (Signed)
Pt diaphoretic, moving all over bed moaning in pain.  PA aware

## 2015-11-12 NOTE — ED Notes (Signed)
Pt presents via POV c/o abdominal pain, nausea, vomiting and back pain.  Pt hx: cyclic vomiting syndrome, gastroparesis.  Pt reports this started yesterday and the pain is usually not this bad.  Pt a x 4, diaphoretic, moving around bed stating shes in pain.

## 2015-11-12 NOTE — ED Notes (Signed)
Pt given PO fluids and ice chips per PA, tolerating well with no vomiting.  Will monitor

## 2015-11-12 NOTE — ED Notes (Signed)
Port de-accessed. Pt tolerated well

## 2015-11-12 NOTE — ED Provider Notes (Signed)
CSN: [id number]     Arrival date & time 11/12/15  1035 History   First MD Initiated Contact with Patient 11/12/15 1053     Chief Complaint  Patient presents with  . Abdominal Pain  . Back Pain  . Emesis   HPI  Ms. Derks is a 49 year old female with PMHx of cyclic vomiting syndrome, GERD, DM, CKD and diabetic gastroparesis presenting with abdominal pain, nausea and vomiting. Patient states that her abdominal pain came on acutely last evening. The pain is epigastric in origin and described as aching. She denies aggravating or alleviating factors. She also endorses nausea and vomiting with multiple episodes of vomiting this morning. She states that this presentation is the same as her past episodes of her chronic abdominal pain. She reports that she still smokes marijuana though acknowledges that this is likely source of cyclic vomiting. She states she is not ready to quit yet. She reports that ativan is the only medication that resolves her nausea. She is also complaining of lumbar back pain. She reports that she was seen by her primary care provider for this and written for oxycodone. She states that the back pain subsided before she got the prescription filled and she did not need to take her pain medications. She states that the vomiting has exacerbated her lumbar back pain. She also admits to not taking her medications over the past 1-2 days. She denies fevers, chills, dizziness, syncope, headache, chest pain, shortness of breath, loss of bowel or bladder, numbness in the groin or weakness of the lower extremities. She states that she has a port placed due to frequent hospitalizations.  Past Medical History  Diagnosis Date  . PANCREATITIS 05/09/2008  . Cyclic vomiting syndrome   . Hypertension   . Complication of anesthesia     "problems waking up"  . Heart murmur   . Myocardial infarction [facility]) 07/2007    "they say I've had a silent one; I don't know"  . Pneumonia   . Anemia   . Blood  transfusion   . GERD (gastroesophageal reflux disease)   . Anxiety   . Depression   . Smoker   . Marijuana smoker, continuous ([place])   . NEUROPATHY, DIABETIC 02/23/2007  . Type II diabetes mellitus ([place])   . Diabetic gastroparesis ([place])     /e-chart  . Chronic kidney disease (CKD), stage III (moderate)   . Nausea & vomiting 06/10/2015  . Intestinal impaction [facility])    Past Surgical History  Procedure Laterality Date  . Port-a-cath placement  ~ 2008    right chest; "poor access; frequent hospitalizations"  . Laparotomy and lysis of adhesions    . Abdominal hysterectomy  02/2002  . Cholecystectomy  1980's  . Cardiac catheterization    . Dilation and evacuation  X3  . Left hand surgery    . Cataract extraction w/ intraocular lens  implant, bilateral    . Colonoscopy    . Upper gi endoscopy    . Cesarean section     Family History  Problem Relation Age of Onset  . Diabetes type II Mother   . Diabetes Mother   . Hypertension Mother   . Diabetes type II Sister   . Diabetes Sister   . Hypertension Sister   . Hypertension Brother    Social History  Substance Use Topics  . Smoking status: Current Some Day Smoker -- 0.50 packs/day for 22 years    Types: Cigarettes  . Smokeless tobacco: Never  Used  . Alcohol Use: No   OB History    Gravida Para Term Preterm AB TAB SAB Ectopic Multiple Living   _0 Review of Systems  Constitutional: Negative for fever and chills.  HENT: Negative for congestion, rhinorrhea and sore throat.   Eyes: Negative for visual disturbance.  Respiratory: Negative for shortness of breath.   Cardiovascular: Negative for chest pain.  Gastrointestinal: Positive for nausea, vomiting and abdominal pain. Negative for diarrhea and constipation.  Genitourinary: Negative for dysuria and flank pain.  Musculoskeletal: Positive for back pain. Negative for myalgias, arthralgias and neck pain.  Skin: Negative for rash.  Neurological: Negative for  dizziness, syncope, weakness, numbness and headaches.  All other systems reviewed and are negative.     Allergies  Bee venom; Acetaminophen; Novolog; Other; Tegaderm ag mesh; and Erythromycin  Home Medications   Prior to Admission medications   Medication Sig Start Date End Date Taking? Authorizing Provider  oxyCODONE (ROXICODONE) 5 MG immediate release tablet Take 1 tablet (5 mg total) by mouth every 4 (four) hours as needed for severe pain. 11/04/15  Yes Alyssa A Lincoln Brigham, MD  promethazine (PHENERGAN) 25 MG suppository Place 1 suppository (25 mg total) rectally every 6 (six) hours as needed for nausea or vomiting. 09/27/15  Yes Tatyana Kirichenko, PA-C  aspirin EC 81 MG tablet Take 1 tablet (81 mg total) by mouth daily. Patient not taking: Reported on 10/15/2015 06/02/15   Hilton Sinclair, MD  blood glucose meter kit and supplies KIT Dispense based on patient and insurance preference. Use up to four times daily as directed. (FOR ICD-9 250.00, 250.01). 06/02/15   Hilton Sinclair, MD  carvedilol (COREG) 25 MG tablet Take 1 tablet (25 mg total) by mouth 2 (two) times daily with a meal. 09/29/15   Alyssa A Haney, MD  cetirizine (ZYRTEC) 10 MG tablet Take 1 tablet (10 mg total) by mouth daily. Patient not taking: Reported on 08/03/2015 06/20/15   Hilton Sinclair, MD  hydrALAZINE (APRESOLINE) 10 MG tablet Take 1 tablet (10 mg total) by mouth 3 (three) times daily. Patient not taking: Reported on 10/15/2015 09/29/15   Veatrice Bourbon, MD  insulin glargine (LANTUS) 100 UNIT/ML injection Inject 0.22 mLs (22 Units total) into the skin daily. 06/02/15   Hilton Sinclair, MD  lisinopril (PRINIVIL,ZESTRIL) 40 MG tablet Take 1 tablet (40 mg total) by mouth daily. 08/13/15   Olam Idler, MD  LORazepam (ATIVAN) 0.5 MG tablet Take 1 tablet (0.5 mg total) by mouth every 4 (four) hours as needed (nausea, vomiting). Patient not taking: Reported on 10/15/2015 09/29/15   Veatrice Bourbon, MD  LORazepam  (ATIVAN) 1 MG tablet Take 1 tablet (1 mg total) by mouth 2 (two) times daily as needed (cyclic vomiting symptoms). 10/15/15   Smiley Houseman, MD  polyethylene glycol (MIRALAX / GLYCOLAX) packet Take 17 g by mouth daily as needed. May increase in 2-3 days to 2 times daily, Until stooling regularly Patient not taking: Reported on 08/03/2015 06/02/15   Hilton Sinclair, MD  pregabalin (LYRICA) 25 MG capsule TAKE 1 TABLET BY MOUTH AT BEDTIME AS NEEDED 08/21/15   Olam Idler, MD  promethazine (PHENERGAN) 12.5 MG tablet Take 1 tablet (12.5 mg total) by mouth every 4 (four) hours as needed for nausea or vomiting. Patient not taking: Reported on 11/12/2015 09/29/15   Veatrice Bourbon, MD  sitaGLIPtin (JANUVIA) 25 MG  tablet Take 1 tablet (25 mg total) by mouth daily. Patient not taking: Reported on 11/12/2015 10/15/15   Smiley Houseman, MD   BP 144/79 mmHg  Pulse 74  Temp(Src) 98.3 F (36.8 C) (Oral)  Resp 24  SpO2 100% Physical Exam  Constitutional: She appears well-developed and well-nourished. She appears distressed.  Patient loudly moaning and rolling around on stretcher.  HENT:  Head: Normocephalic and atraumatic.  Eyes: Conjunctivae are normal. Right eye exhibits no discharge. Left eye exhibits no discharge. No scleral icterus.  Neck: Normal range of motion.  Cardiovascular: Normal rate, regular rhythm and normal heart sounds.   Pulmonary/Chest: Effort normal and breath sounds normal. No respiratory distress. She has no wheezes. She has no rales.  Abdominal: Soft. Normal appearance and bowel sounds are normal. She exhibits no distension. There is tenderness in the epigastric area. There is no rigidity, no rebound and no guarding.    Abdomen is soft with epigastric tenderness. No rebound or guarding.  Musculoskeletal: Normal range of motion.  Moves all extremities spontaneously. No tenderness over lumbar region. No spiny deformity or step-offs  Neurological: She is alert.  Coordination normal.  5/5 strength in BLE. Sensation to light touch intact.   Skin: Skin is warm and dry.  Psychiatric: She has a normal mood and affect. Her behavior is normal.  Nursing note and vitals reviewed.   ED Course  Procedures (including critical care time) Labs Review Labs Reviewed  COMPREHENSIVE METABOLIC PANEL - Abnormal; Notable for the following:    CO2 18 (*)    Glucose, Bld 435 (*)    BUN 41 (*)    Creatinine, Ser 2.84 (*)    GFR calc non Af Amer 18 (*)    GFR calc Af Amer 21 (*)    Anion gap 17 (*)    All other components within normal limits  CBC - Abnormal; Notable for the following:    WBC 14.2 (*)    RBC 3.63 (*)    Hemoglobin 11.3 (*)    HCT 32.8 (*)    All other components within normal limits  URINALYSIS, ROUTINE W REFLEX MICROSCOPIC (NOT AT [facility]) - Abnormal; Notable for the following:    APPearance CLOUDY (*)    Glucose, UA >1000 (*)    Hgb urine dipstick MODERATE (*)    Ketones, ur 15 (*)    Protein, ur >300 (*)    All other components within normal limits  URINE MICROSCOPIC-ADD ON - Abnormal; Notable for the following:    Squamous Epithelial / LPF 0-5 (*)    Casts HYALINE CASTS (*)    All other components within normal limits  I-STAT CHEM 8, ED - Abnormal; Notable for the following:    BUN 46 (*)    Creatinine, Ser 2.80 (*)    Glucose, Bld 492 (*)    All other components within normal limits  CBG MONITORING, ED - Abnormal; Notable for the following:    Glucose-Capillary 381 (*)    All other components within normal limits  CBG MONITORING, ED - Abnormal; Notable for the following:    Glucose-Capillary 284 (*)    All other components within normal limits  LIPASE, BLOOD    Imaging Review No results found. I have personally reviewed and evaluated these images and lab results as part of my medical decision-making.   EKG Interpretation None      MDM   Final diagnoses:  Non-intractable cyclical vomiting with nausea   Patient  presenting with  abdominal pain, nausea and vomiting. Pt has history of cyclic vomiting syndrome and states that this presentation is similar. VSS. Patient is nontoxic but appears in moderate distress. Abdomen is soft with epigastric tenderness. No peritoneal signs. Patient's pain and other symptoms adequately managed in emergency department. Fluid bolus given. Labs, imaging and vitals reviewed. Pt presented with anion gap of 17 which closed to 15 after 1 L bolus. Also noted to have WBC elevation which is likely due to stress response from multiple episodes of vomiting. Glucose 485. Will give 8 units of insulin. Chronic cyclic vomiting likely source of her symptoms. Patient does not meet the SIRS or Sepsis criteria. On repeat exam, patient appears much more comfortable and abdomen is soft, non-tender and there are no peritoneal signs. Patient reports resolution of symptoms and is requesting discharge. Patient discussed with Dr. Maryan Rued who agrees with assessment and plan for discharge and follow up. Patient care signed out to [facility], PA-C pending decrease in glucose below 300 before discharge.       Josephina Gip, PA-C 11/12/15 1855  Josephina Gip, PA-C 11/12/15 1856  Blanchie Dessert, MD 11/13/15 [id number]

## 2015-11-12 NOTE — ED Notes (Signed)
CBG 284, PA aware

## 2015-11-12 NOTE — Discharge Instructions (Signed)
Return here as needed.  Follow-up with your primary doctor, increase your fluid intake

## 2015-11-12 NOTE — ED Notes (Signed)
PA at bedside updating pt and pt family.

## 2015-11-12 NOTE — ED Notes (Addendum)
CBG 381-PA aware

## 2015-11-12 NOTE — ED Notes (Addendum)
[phone]: Jethro Poling, please call when pt is ready for discharge.

## 2015-11-15 ENCOUNTER — Inpatient Hospital Stay (HOSPITAL_COMMUNITY)
Admission: EM | Admit: 2015-11-15 | Discharge: 2015-11-17 | DRG: 103 | Disposition: A | Discharge status: Home / Self Care | Payer: Medicare Other | Attending: Family Medicine | Admitting: Family Medicine

## 2015-11-15 ENCOUNTER — Encounter (HOSPITAL_COMMUNITY): Payer: Self-pay

## 2015-11-15 DIAGNOSIS — Z7982 Long term (current) use of aspirin: Secondary | ICD-10-CM | POA: Diagnosis not present

## 2015-11-15 DIAGNOSIS — I252 Old myocardial infarction: Secondary | ICD-10-CM

## 2015-11-15 DIAGNOSIS — E1142 Type 2 diabetes mellitus with diabetic polyneuropathy: Secondary | ICD-10-CM | POA: Diagnosis present

## 2015-11-15 DIAGNOSIS — R51 Headache: Secondary | ICD-10-CM | POA: Diagnosis not present

## 2015-11-15 DIAGNOSIS — G43A1 Cyclical vomiting, intractable: Secondary | ICD-10-CM | POA: Diagnosis not present

## 2015-11-15 DIAGNOSIS — K3184 Gastroparesis: Secondary | ICD-10-CM | POA: Diagnosis not present

## 2015-11-15 DIAGNOSIS — Z79891 Long term (current) use of opiate analgesic: Secondary | ICD-10-CM

## 2015-11-15 DIAGNOSIS — Z9049 Acquired absence of other specified parts of digestive tract: Secondary | ICD-10-CM

## 2015-11-15 DIAGNOSIS — E869 Volume depletion, unspecified: Secondary | ICD-10-CM | POA: Diagnosis present

## 2015-11-15 DIAGNOSIS — N3090 Cystitis, unspecified without hematuria: Secondary | ICD-10-CM | POA: Diagnosis not present

## 2015-11-15 DIAGNOSIS — G43A0 Cyclical vomiting, not intractable: Principal | ICD-10-CM | POA: Diagnosis present

## 2015-11-15 DIAGNOSIS — G8929 Other chronic pain: Secondary | ICD-10-CM | POA: Diagnosis present

## 2015-11-15 DIAGNOSIS — F17210 Nicotine dependence, cigarettes, uncomplicated: Secondary | ICD-10-CM | POA: Diagnosis present

## 2015-11-15 DIAGNOSIS — Z79899 Other long term (current) drug therapy: Secondary | ICD-10-CM | POA: Diagnosis not present

## 2015-11-15 DIAGNOSIS — K0889 Other specified disorders of teeth and supporting structures: Secondary | ICD-10-CM

## 2015-11-15 DIAGNOSIS — K219 Gastro-esophageal reflux disease without esophagitis: Secondary | ICD-10-CM | POA: Diagnosis present

## 2015-11-15 DIAGNOSIS — E1122 Type 2 diabetes mellitus with diabetic chronic kidney disease: Secondary | ICD-10-CM | POA: Diagnosis present

## 2015-11-15 DIAGNOSIS — R1110 Vomiting, unspecified: Secondary | ICD-10-CM | POA: Diagnosis present

## 2015-11-15 DIAGNOSIS — R112 Nausea with vomiting, unspecified: Secondary | ICD-10-CM | POA: Diagnosis not present

## 2015-11-15 DIAGNOSIS — N179 Acute kidney failure, unspecified: Secondary | ICD-10-CM | POA: Diagnosis present

## 2015-11-15 DIAGNOSIS — N183 Chronic kidney disease, stage 3 (moderate): Secondary | ICD-10-CM | POA: Diagnosis present

## 2015-11-15 DIAGNOSIS — Z794 Long term (current) use of insulin: Secondary | ICD-10-CM

## 2015-11-15 DIAGNOSIS — F1210 Cannabis abuse, uncomplicated: Secondary | ICD-10-CM | POA: Diagnosis present

## 2015-11-15 DIAGNOSIS — E871 Hypo-osmolality and hyponatremia: Secondary | ICD-10-CM | POA: Diagnosis present

## 2015-11-15 DIAGNOSIS — R1115 Cyclical vomiting syndrome unrelated to migraine: Secondary | ICD-10-CM

## 2015-11-15 DIAGNOSIS — M545 Low back pain: Secondary | ICD-10-CM | POA: Diagnosis present

## 2015-11-15 DIAGNOSIS — I129 Hypertensive chronic kidney disease with stage 1 through stage 4 chronic kidney disease, or unspecified chronic kidney disease: Secondary | ICD-10-CM | POA: Diagnosis present

## 2015-11-15 DIAGNOSIS — F329 Major depressive disorder, single episode, unspecified: Secondary | ICD-10-CM | POA: Diagnosis present

## 2015-11-15 LAB — URINALYSIS, ROUTINE W REFLEX MICROSCOPIC
Bilirubin Urine: NEGATIVE
Glucose, UA: >1000 mg/dL — AB
Ketones, ur: NEGATIVE mg/dL
NITRITE: POSITIVE — AB
Protein, ur: 100 mg/dL — AB
SPECIFIC GRAVITY, URINE: 1.021 (ref 1.005–1.030)
pH: 5.5 (ref 5.0–8.0)

## 2015-11-15 LAB — COMPREHENSIVE METABOLIC PANEL
ALT: 15 U/L (ref 14–54)
AST: 17 U/L (ref 15–41)
Albumin: 4.1 g/dL (ref 3.5–5.0)
Alkaline Phosphatase: 81 U/L (ref 38–126)
Anion gap: 10 (ref 5–15)
BILIRUBIN TOTAL: 1.1 mg/dL (ref 0.3–1.2)
BUN: 34 mg/dL — AB (ref 6–20)
CO2: 26 mmol/L (ref 22–32)
CREATININE: 2.39 mg/dL — AB (ref 0.44–1.00)
Calcium: 9.5 mg/dL (ref 8.9–10.3)
Chloride: 93 mmol/L — ABNORMAL LOW (ref 101–111)
GFR calc Af Amer: 26 mL/min — ABNORMAL LOW (ref >60)
GFR, EST NON AFRICAN AMERICAN: 23 mL/min — AB (ref >60)
Glucose, Bld: 430 mg/dL — ABNORMAL HIGH (ref 65–99)
POTASSIUM: 4.1 mmol/L (ref 3.5–5.1)
Sodium: 129 mmol/L — ABNORMAL LOW (ref 135–145)
TOTAL PROTEIN: 7.4 g/dL (ref 6.5–8.1)

## 2015-11-15 LAB — CBC WITH DIFFERENTIAL/PLATELET
BASOS ABS: 0 10*3/uL (ref 0.0–0.1)
Basophils Relative: 0 %
Eosinophils Absolute: 0 10*3/uL (ref 0.0–0.7)
Eosinophils Relative: 1 %
HEMATOCRIT: 36.4 % (ref 36.0–46.0)
Hemoglobin: 12.8 g/dL (ref 12.0–15.0)
LYMPHS ABS: 3.6 10*3/uL (ref 0.7–4.0)
LYMPHS PCT: 47 %
MCH: 30.9 pg (ref 26.0–34.0)
MCHC: 35.2 g/dL (ref 30.0–36.0)
MCV: 87.9 fL (ref 78.0–100.0)
MONO ABS: 0.5 10*3/uL (ref 0.1–1.0)
MONOS PCT: 7 %
NEUTROS ABS: 3.4 10*3/uL (ref 1.7–7.7)
Neutrophils Relative %: 45 %
Platelets: 191 10*3/uL (ref 150–400)
RBC: 4.14 MIL/uL (ref 3.87–5.11)
RDW: 12.9 % (ref 11.5–15.5)
WBC: 7.5 10*3/uL (ref 4.0–10.5)

## 2015-11-15 LAB — URINE MICROSCOPIC-ADD ON

## 2015-11-15 LAB — RAPID URINE DRUG SCREEN, HOSP PERFORMED
Amphetamines: NOT DETECTED
BENZODIAZEPINES: POSITIVE — AB
Barbiturates: NOT DETECTED
Cocaine: NOT DETECTED
Opiates: NOT DETECTED
Tetrahydrocannabinol: POSITIVE — AB

## 2015-11-15 LAB — GLUCOSE, CAPILLARY
GLUCOSE-CAPILLARY: 451 mg/dL — AB (ref 65–99)
Glucose-Capillary: 384 mg/dL — ABNORMAL HIGH (ref 65–99)

## 2015-11-15 LAB — CBG MONITORING, ED: GLUCOSE-CAPILLARY: 379 mg/dL — AB (ref 65–99)

## 2015-11-15 LAB — LIPASE, BLOOD: LIPASE: 56 U/L — AB (ref 11–51)

## 2015-11-15 MED ORDER — SODIUM CHLORIDE 0.9 % IJ SOLN: 10.0000 mL | INTRAMUSCULAR | Status: DC | PRN

## 2015-11-15 MED ORDER — METOCLOPRAMIDE HCL 5 MG/ML IJ SOLN
10.0000 mg | Freq: Once | INTRAMUSCULAR | Status: DC
  Filled 2015-11-15: qty 2

## 2015-11-15 MED ORDER — MORPHINE SULFATE (PF) 4 MG/ML IV SOLN
4.0000 mg | INTRAVENOUS | Status: DC | PRN
  Administered 2015-11-16 (×2): 4 mg via INTRAVENOUS
  Filled 2015-11-15 (×2): qty 1

## 2015-11-15 MED ORDER — LORAZEPAM 1 MG PO TABS
1.0000 mg | ORAL_TABLET | Freq: Two times a day (BID) | ORAL | Status: DC | PRN
  Administered 2015-11-15: 1 mg via ORAL
  Filled 2015-11-15: qty 1

## 2015-11-15 MED ORDER — SODIUM CHLORIDE 0.9 % IV BOLUS (SEPSIS)
1000.0000 mL | Freq: Once | INTRAVENOUS | Status: AC
  Administered 2015-11-15: 1000 mL via INTRAVENOUS

## 2015-11-15 MED ORDER — ASPIRIN EC 81 MG PO TBEC
81.0000 mg | DELAYED_RELEASE_TABLET | Freq: Every day | ORAL | Status: DC
  Administered 2015-11-16 – 2015-11-17 (×2): 81 mg via ORAL
  Filled 2015-11-15 (×2): qty 1

## 2015-11-15 MED ORDER — DEXTROSE 5 % IV SOLN
1.0000 g | INTRAVENOUS | Status: DC
  Administered 2015-11-15 – 2015-11-16 (×2): 1 g via INTRAVENOUS
  Filled 2015-11-15 (×3): qty 10

## 2015-11-15 MED ORDER — INSULIN ASPART 100 UNIT/ML ~~LOC~~ SOLN
0.0000 [IU] | Freq: Three times a day (TID) | SUBCUTANEOUS | Status: DC
Start: 2015-11-16 — End: 2015-11-17
  Administered 2015-11-16 – 2015-11-17 (×2): 5 [IU] via SUBCUTANEOUS

## 2015-11-15 MED ORDER — INSULIN ASPART 100 UNIT/ML ~~LOC~~ SOLN
0.0000 [IU] | Freq: Every day | SUBCUTANEOUS | Status: DC
Start: 2015-11-15 — End: 2015-11-17
  Administered 2015-11-15: 5 [IU] via SUBCUTANEOUS

## 2015-11-15 MED ORDER — CARVEDILOL 25 MG PO TABS
25.0000 mg | ORAL_TABLET | Freq: Two times a day (BID) | ORAL | Status: DC
  Administered 2015-11-15 – 2015-11-17 (×4): 25 mg via ORAL
  Filled 2015-11-15 (×4): qty 1

## 2015-11-15 MED ORDER — HYDRALAZINE HCL 10 MG PO TABS
10.0000 mg | ORAL_TABLET | Freq: Three times a day (TID) | ORAL | Status: DC
  Administered 2015-11-15 – 2015-11-17 (×5): 10 mg via ORAL
  Filled 2015-11-15 (×5): qty 1

## 2015-11-15 MED ORDER — SODIUM CHLORIDE 0.9 % IV SOLN
INTRAVENOUS | Status: DC
  Administered 2015-11-15: 18:00:00 via INTRAVENOUS

## 2015-11-15 MED ORDER — MORPHINE SULFATE (PF) 4 MG/ML IV SOLN
4.0000 mg | INTRAVENOUS | Status: DC | PRN
  Administered 2015-11-15 (×2): 4 mg via INTRAVENOUS
  Filled 2015-11-15 (×2): qty 1

## 2015-11-15 MED ORDER — METOCLOPRAMIDE HCL 5 MG/ML IJ SOLN
10.0000 mg | Freq: Once | INTRAMUSCULAR | Status: AC
  Administered 2015-11-15: 10 mg via INTRAVENOUS
  Filled 2015-11-15: qty 2

## 2015-11-15 MED ORDER — PROCHLORPERAZINE EDISYLATE 5 MG/ML IJ SOLN
10.0000 mg | Freq: Four times a day (QID) | INTRAMUSCULAR | Status: DC | PRN
  Administered 2015-11-16: 10 mg via INTRAVENOUS
  Filled 2015-11-15 (×2): qty 2

## 2015-11-15 MED ORDER — LORATADINE 10 MG PO TABS
10.0000 mg | ORAL_TABLET | Freq: Every day | ORAL | Status: DC
  Administered 2015-11-16 – 2015-11-17 (×2): 10 mg via ORAL
  Filled 2015-11-15 (×2): qty 1

## 2015-11-15 MED ORDER — INSULIN GLARGINE 100 UNIT/ML ~~LOC~~ SOLN
12.0000 [IU] | Freq: Every day | SUBCUTANEOUS | Status: DC
  Administered 2015-11-15 – 2015-11-17 (×2): 12 [IU] via SUBCUTANEOUS
  Filled 2015-11-15 (×3): qty 0.12

## 2015-11-15 MED ORDER — ENOXAPARIN SODIUM 30 MG/0.3ML ~~LOC~~ SOLN
30.0000 mg | SUBCUTANEOUS | Status: DC
  Administered 2015-11-15: 30 mg via SUBCUTANEOUS
  Filled 2015-11-15 (×2): qty 0.3

## 2015-11-15 MED ORDER — LORAZEPAM 2 MG/ML IJ SOLN
1.0000 mg | Freq: Once | INTRAMUSCULAR | Status: AC
  Administered 2015-11-15: 1 mg via INTRAVENOUS
  Filled 2015-11-15: qty 1

## 2015-11-15 MED ORDER — METOCLOPRAMIDE HCL 5 MG/ML IJ SOLN: 10.0000 mg | Freq: Once | INTRAMUSCULAR | Status: DC

## 2015-11-15 MED ORDER — PANTOPRAZOLE SODIUM 40 MG IV SOLR
40.0000 mg | INTRAVENOUS | Status: DC
  Administered 2015-11-16: 40 mg via INTRAVENOUS
  Filled 2015-11-15: qty 40

## 2015-11-15 MED ORDER — PREGABALIN 25 MG PO CAPS
25.0000 mg | ORAL_CAPSULE | Freq: Every day | ORAL | Status: DC
  Administered 2015-11-15 – 2015-11-16 (×2): 25 mg via ORAL
  Filled 2015-11-15 (×2): qty 1

## 2015-11-15 NOTE — ED Notes (Signed)
Pt ambulated to the br with out difficulty. Urine specimen obtained.

## 2015-11-15 NOTE — ED Provider Notes (Signed)
CSN: [id number]     Arrival date & time 11/15/15  1031 History   First MD Initiated Contact with Patient 11/15/15 1100     Chief Complaint  Patient presents with  . Abdominal Pain  . Back Pain     (Consider location/radiation/quality/duration/timing/severity/associated sxs/prior Treatment) HPI  [name] [name] s a 49 year old female with PMHx of cyclic vomiting syndrome, GERD, DM, CKD and diabetic gastroparesis presenting with abdominal pain, nausea and vomiting x 1 week. She was seen in ED and discharged home on 11/16 for similar sxs and has had no improvement upon discharge. States she has not eaten or taken any of her medications in 5 days. No alleviating factors noted. Patient unable to tolerate PO. Patient has cyclic vomiting syndrome, and states this is similar to previous episodes.    Past Medical History  Diagnosis Date  . PANCREATITIS 05/09/2008  . Cyclic vomiting syndrome   . Hypertension   . Complication of anesthesia     "problems waking up"  . Heart murmur   . Myocardial infarction [facility]) 07/2007    "they say I've had a silent one; I don't know"  . Pneumonia   . Anemia   . Blood transfusion   . GERD (gastroesophageal reflux disease)   . Anxiety   . Depression   . Smoker   . Marijuana smoker, continuous ([place])   . NEUROPATHY, DIABETIC 02/23/2007  . Type II diabetes mellitus ([place])   . Diabetic gastroparesis ([place])     /e-chart  . Chronic kidney disease (CKD), stage III (moderate)   . Nausea & vomiting 06/10/2015  . Intestinal impaction [facility])    Past Surgical History  Procedure Laterality Date  . Port-a-cath placement  ~ 2008    right chest; "poor access; frequent hospitalizations"  . Laparotomy and lysis of adhesions    . Abdominal hysterectomy  02/2002  . Cholecystectomy  1980's  . Cardiac catheterization    . Dilation and evacuation  X3  . Left hand surgery    . Cataract extraction w/ intraocular lens  implant, bilateral    . Colonoscopy    . Upper gi  endoscopy    . Cesarean section     Family History  Problem Relation Age of Onset  . Diabetes type II Mother   . Diabetes Mother   . Hypertension Mother   . Diabetes type II Sister   . Diabetes Sister   . Hypertension Sister   . Hypertension Brother    Social History  Substance Use Topics  . Smoking status: Current Some Day Smoker -- 0.50 packs/day for 22 years    Types: Cigarettes  . Smokeless tobacco: Never Used  . Alcohol Use: No   OB History    Gravida Para Term Preterm AB TAB SAB Ectopic Multiple Living   '7    3 3    4     ' Review of Systems  Constitutional: Positive for appetite change (Decreased). Negative for fever, chills, diaphoresis and fatigue.  HENT: Negative for congestion, rhinorrhea and sore throat.   Eyes: Negative for visual disturbance.  Respiratory: Negative for cough, shortness of breath and wheezing.   Cardiovascular: Negative.   Gastrointestinal: Positive for nausea, vomiting and abdominal pain. Negative for diarrhea and constipation.  Endocrine: Negative for polydipsia and polyuria.  Musculoskeletal: Positive for back pain. Negative for myalgias, arthralgias and neck pain.  Skin: Negative for rash.  Neurological: Negative for dizziness, weakness and headaches.      Allergies  Bee venom; Acetaminophen; Novolog; Other; Tegaderm ag mesh; and Erythromycin  Home Medications   Prior to Admission medications   Medication Sig Start Date End Date Taking? Authorizing Provider  blood glucose meter kit and supplies KIT Dispense based on patient and insurance preference. Use up to four times daily as directed. (FOR ICD-9 250.00, 250.01). 06/02/15  Yes Hilton Sinclair, MD  carvedilol (COREG) 25 MG tablet Take 1 tablet (25 mg total) by mouth 2 (two) times daily with a meal. 09/29/15  Yes Alyssa A Haney, MD  cetirizine (ZYRTEC) 10 MG tablet Take 1 tablet (10 mg total) by mouth daily. 06/20/15  Yes Hilton Sinclair, MD  insulin glargine (LANTUS) 100  UNIT/ML injection Inject 0.22 mLs (22 Units total) into the skin daily. 06/02/15  Yes Hilton Sinclair, MD  lisinopril (PRINIVIL,ZESTRIL) 40 MG tablet Take 1 tablet (40 mg total) by mouth daily. 08/13/15  Yes Olam Idler, MD  LORazepam (ATIVAN) 0.5 MG tablet Take 1 tablet (0.5 mg total) by mouth every 4 (four) hours as needed (nausea, vomiting). 09/29/15  Yes Alyssa A Haney, MD  oxyCODONE (ROXICODONE) 5 MG immediate release tablet Take 1 tablet (5 mg total) by mouth every 4 (four) hours as needed for severe pain. 11/04/15  Yes Alyssa A Haney, MD  polyethylene glycol (MIRALAX / GLYCOLAX) packet Take 17 g by mouth daily as needed. May increase in 2-3 days to 2 times daily, Until stooling regularly 06/02/15  Yes Hilton Sinclair, MD  pregabalin (LYRICA) 25 MG capsule TAKE 1 TABLET BY MOUTH AT BEDTIME AS NEEDED 08/21/15  Yes Olam Idler, MD  promethazine (PHENERGAN) 12.5 MG tablet Take 1 tablet (12.5 mg total) by mouth every 4 (four) hours as needed for nausea or vomiting. 09/29/15  Yes Alyssa A Lincoln Brigham, MD  promethazine (PHENERGAN) 25 MG suppository Place 1 suppository (25 mg total) rectally every 6 (six) hours as needed for nausea or vomiting. 09/27/15  Yes Tatyana Kirichenko, PA-C  aspirin EC 81 MG tablet Take 1 tablet (81 mg total) by mouth daily. Patient not taking: Reported on 10/15/2015 06/02/15   Hilton Sinclair, MD  hydrALAZINE (APRESOLINE) 10 MG tablet Take 1 tablet (10 mg total) by mouth 3 (three) times daily. Patient not taking: Reported on 10/15/2015 09/29/15   Veatrice Bourbon, MD  LORazepam (ATIVAN) 1 MG tablet Take 1 tablet (1 mg total) by mouth 2 (two) times daily as needed (cyclic vomiting symptoms). 10/15/15   Smiley Houseman, MD   BP 154/102 mmHg  Pulse 88  Temp(Src) 98.8 F (37.1 C) (Oral)  Resp 22  SpO2 100% Physical Exam  Constitutional: She is oriented to person, place, and time. She appears well-developed and well-nourished.  Tearful  No acute distress  HENT:    Head: Normocephalic and atraumatic.  Cardiovascular: Normal rate, regular rhythm, normal heart sounds and intact distal pulses.  Exam reveals no gallop and no friction rub.   No murmur heard. Pulmonary/Chest: Effort normal and breath sounds normal. No respiratory distress. She has no wheezes. She has no rales. She exhibits no tenderness.  Abdominal: She exhibits no mass. There is no rebound and no guarding.    Abdomen soft, non-distended Epigastric tenderness as depicted in image.  Bowel sounds positive in all four quadrants  Musculoskeletal: She exhibits no edema.  Neurological: She is alert and oriented to person, place, and time.  Skin: Skin is warm and dry. No rash noted.  Psychiatric: She has a normal mood and affect. Her  behavior is normal. Judgment and thought content normal.  Nursing note and vitals reviewed.   ED Course  Procedures (including critical care time) Labs Review Labs Reviewed  COMPREHENSIVE METABOLIC PANEL - Abnormal; Notable for the following:    Sodium 129 (*)    Chloride 93 (*)    Glucose, Bld 430 (*)    BUN 34 (*)    Creatinine, Ser 2.39 (*)    GFR calc non Af Amer 23 (*)    GFR calc Af Amer 26 (*)    All other components within normal limits  URINE RAPID DRUG SCREEN, HOSP PERFORMED - Abnormal; Notable for the following:    Benzodiazepines POSITIVE (*)    Tetrahydrocannabinol POSITIVE (*)    All other components within normal limits  URINALYSIS, ROUTINE W REFLEX MICROSCOPIC (NOT AT [facility]) - Abnormal; Notable for the following:    APPearance CLOUDY (*)    Glucose, UA >1000 (*)    Hgb urine dipstick MODERATE (*)    Protein, ur 100 (*)    Nitrite POSITIVE (*)    Leukocytes, UA MODERATE (*)    All other components within normal limits  LIPASE, BLOOD - Abnormal; Notable for the following:    Lipase 56 (*)    All other components within normal limits  URINE MICROSCOPIC-ADD ON - Abnormal; Notable for the following:    Squamous Epithelial / LPF 0-5 (*)     Bacteria, UA MANY (*)    All other components within normal limits  CBG MONITORING, ED - Abnormal; Notable for the following:    Glucose-Capillary 379 (*)    All other components within normal limits  CBC WITH DIFFERENTIAL/PLATELET    Imaging Review No results found. I have personally reviewed and evaluated these images and lab results as part of my medical decision-making.   EKG Interpretation   Date/Time:  Saturday November 15 2015 10:47:19 EST Ventricular Rate:  99 PR Interval:  138 QRS Duration: 74 QT Interval:  338 QTC Calculation: 434 R Axis:   81 Text Interpretation:  Sinus rhythm Biatrial enlargement Anteroseptal  infarct, old Baseline wander in lead(s) II III aVL aVF No significant  change since last tracing Confirmed by ZACKOWSKI  MD, SCOTT [phone]) on  11/15/2015 11:02:28 AM      MDM   Final diagnoses:  Non-intractable cyclical vomiting with nausea  Gastroparesis  Cystitis   [name] presents with nausea/vomiting/abdominal pain with no improvement x 1 week  Labs: CBC wdl; CMP shows increased BUN & Cr (similar to previous ED visits); glucose of 430; lipase mildly elevated at 56 UDS + for THC, BZ UA with + nitrites, leuks, and many bacteria.   Patient re-evaluated after reglan and ativan; started on fluids; Pt. States she feels better but still having epigastric pain; no episodes of emesis since arrival but dry heaving.   3:54 PM - Patient and family do not feel comfortable going home. Discussed patient with family medicine who will admit. They are aware of UTI and elevated glucose.   Patient seen by and discussed with Dr. Rogene Houston who agrees with treatment plan.    [facility], PA-C 11/15/15 [place], MD 11/16/15 [phone]

## 2015-11-15 NOTE — Progress Notes (Signed)
Pt admitted to the unit at 1837. Pt mental status is alert, verbal, oriented x4. Pt oriented to room, staff, and call bell. Skin is intact.Call bell within reach. Visitor guidelines reviewed w/ pt and/or family. Educated regarding safety plan.

## 2015-11-15 NOTE — ED Notes (Signed)
Pt's sister -- Jethro Poling [phone]  Son -- Collette Cheney [phone]

## 2015-11-15 NOTE — ED Notes (Signed)
Pt was seen at [facility] 3 days ago for n/v, upper abd pain - states symptoms have not improved. Pt states she has been unable to tolerate food/fluids.

## 2015-11-15 NOTE — Progress Notes (Signed)
Pt had CBG of 451. Notified MD for Family Medicine of result. Gave Lantus 12 units. Per Dr. Brett Albino, give Novolog insulin per bedtime sliding scale. Will continue to monitor.

## 2015-11-15 NOTE — H&P (Signed)
[facility] Admission History and Physical Service Pager: [phone]  Patient name: [name] [name] Medical record number: [id number] Date of birth: [birth year] Age: 49 y.o. Gender: female  Primary Care Provider: Phill Myron, MD Consultants: None Code Status: Full  Chief Complaint: Nausea and vomiting  Assessment and Plan: [name] [name] is a 49 y.o. female presenting with nausea and vomiting. PMH is significant for cyclic vomiting syndrome, marijuana abuse, major depression, HTN, DM, CKD III.  Cyclic vomiting and abdominal pain: Long history of cyclic vomiting associated with marijuana use. UDS was positive for THC. Nausea and vomiting were unrelieved with Phenergan suppositories and Ativan at home. Pt endorsing some blood streaks in the vomit and some brown emesis with red tint. Possibly due to Mallory-Weiss tears. Hemoglobin stable. On exam, she has moderate epigastric pain. Received 1L of fluids in the ED.  - MIVFs: NS at 125 ml/hr - Ativan 3m po bid prn and Compazine 142mq6hrs prn for nausea/vomiting - Morphine 48m55m3hrs prn for abdominal pain. - Protonix 60m72mily - Will get FOBT and gastroccult to rule out GI bleed. - Clear liquid diet. Will advance as tolerated. - Strict I/O's.  Urinary tract infection: Pt endorses discomfort with urination recently. UA significant for many bacteria, moderate leukocytes, positive nitrites, TNTC WBC. No fevers or CVA tenderness to suggest pyelonephritis - Rocephin 1g daily - Urine culture pending  HTN: BPs ranging from 154-192/77-102 in the ED. - Continue home meds: Carvedilol 25mg51m, Hydralazine 10mg 1m- Holding home Lisinopril in the setting of AKI  Hyponatremia: currently 129 on admission. Most likely secondary to volume depletion from vomiting - follow-up BMP  T2DM with peripheral neuropathy: Last HgbA1c 9.6%. - Lantus 12 units daily, down from home dose of Lantus 22 units - CBGs with meals and at  bedtime - Continue home med: Lyrica 25mg d65m for neuropathy  AKI on CKD III: Cr 2.39. Baseline around 2.0. Likely secondary to volume depletion. - Holding home Lisinopril in the setting of AKI - Avoid nephrotoxic medications - Monitor with daily BMETs  FEN/GI: MIVFs: NS at 125 ml/hr Prophylaxis: Lovenox  Disposition: Admit to FPTS, attending Dr. ChambliErin Hearingipate discharge home in 1-2 days once she can tolerate PO.  History of Present Illness:  [name] SAYDEE ZOLMAN1 y.o.81emale presenting with nausea and vomiting for the past week. She states she has been decreasing her marijuana usage for the past few months. She reports her last usage was last week. She has not been able to keep fluids down. Her emesis is generally clear but sometimes contains streaks. She also endorses some brown emesis with red tint. She has some associated epigastric and back pain. She reports some loose stools. She has been using phenergan and ativan which helped with her vomiting but not her nausea. She has no fevers, but endorses some chills. She endorses some pressure when urinating yesterday with no associated frequency or urgency.  In the ED, she was treated with Reglan and Ativan, which helped the vomiting, but not the nausea. She would like an additional medication for nausea and pain. CBG was elevated at 379. Her UA was significant for many bacteria, >1000 glucose, moderate Hgb, moderate leukocytes, positive nitrites, TNTC WBC. UDS was positive for benzos and THC. EKG showed normal QT interval.  Review Of Systems: Per HPI with the following additions: None Otherwise the remainder of the systems were negative.  Patient Active Problem List   Diagnosis Date Noted  .  Cyclical vomiting syndrome 09/27/2015  . Gastroparesis 08/21/2015  . Hematuria 07/09/2015  . Blood in stool 06/23/2015  . Constipation 06/06/2015  . Onychomycosis 03/25/2015  . Encounter for chronic pain management 01/06/2015  . Vitamin D  deficiency 11/28/2014  . At risk for polypharmacy 08/15/2014  . Other and unspecified hyperlipidemia 08/15/2014  . Leg cramping 07/18/2014  . Rotator cuff syndrome 04/19/2014  . Biceps tendinopathy of right upper extremity 04/05/2014  . Protein-calorie malnutrition, severe ([place]) 12/17/2013  . Anemia 11/15/2013  . CKD (chronic kidney disease) 08/17/2013  . Esophagitis 04/18/2013  . Preventative health care 09/18/2012  . High risk social situation 08/31/2012  . Allergic rhinitis, seasonal 05/04/2012  . Marijuana smoker ([place]) 11/03/2011  . Uncontrolled secondary diabetes mellitus with stage 3 CKD (GFR 30-59) ([place]) 10/23/2011  . Cyclic vomiting syndrome 08/03/2011  . Hot flashes 07/29/2011  . Backache 04/10/2007  . DEPRESSION, MAJOR, RECURRENT 02/23/2007  . Anxiety state 02/23/2007  . TOBACCO DEPENDENCE 02/23/2007  . MIGRAINE, UNSPEC., W/O INTRACTABLE MIGRAINE 02/23/2007  . Essential hypertension, malignant 02/23/2007  . INSOMNIA NOS 02/23/2007  . NEUROPATHY, DIABETIC 02/23/2007  . GERD 01/27/2004    Past Medical History: Past Medical History  Diagnosis Date  . PANCREATITIS 05/09/2008  . Cyclic vomiting syndrome   . Hypertension   . Complication of anesthesia     "problems waking up"  . Heart murmur   . Myocardial infarction [facility]) 07/2007    "they say I've had a silent one; I don't know"  . Pneumonia   . Anemia   . Blood transfusion   . GERD (gastroesophageal reflux disease)   . Anxiety   . Depression   . Smoker   . Marijuana smoker, continuous ([place])   . NEUROPATHY, DIABETIC 02/23/2007  . Type II diabetes mellitus ([place])   . Diabetic gastroparesis ([place])     /e-chart  . Chronic kidney disease (CKD), stage III (moderate)   . Nausea & vomiting 06/10/2015  . Intestinal impaction [facility])     Past Surgical History: Past Surgical History  Procedure Laterality Date  . Port-a-cath placement  ~ 2008    right chest; "poor access; frequent hospitalizations"  . Laparotomy and  lysis of adhesions    . Abdominal hysterectomy  02/2002  . Cholecystectomy  1980's  . Cardiac catheterization    . Dilation and evacuation  X3  . Left hand surgery    . Cataract extraction w/ intraocular lens  implant, bilateral    . Colonoscopy    . Upper gi endoscopy    . Cesarean section      Social History: Social History  Substance Use Topics  . Smoking status: Current Some Day Smoker -- 0.50 packs/day for 22 years    Types: Cigarettes  . Smokeless tobacco: Never Used  . Alcohol Use: No   Additional social history: Smokes about 1/2 joint or marijuana per day. She has decreased her usage from 1 joint per day. Please also refer to relevant sections of EMR.  Family History: Family History  Problem Relation Age of Onset  . Diabetes type II Mother   . Diabetes Mother   . Hypertension Mother   . Diabetes type II Sister   . Diabetes Sister   . Hypertension Sister   . Hypertension Brother     Allergies and Medications: Allergies  Allergen Reactions  . Bee Venom Anaphylaxis  . Acetaminophen Nausea And Vomiting  . Novolog [Insulin Aspart] Other (See Comments)    Muscles in feet cramp  .  Other     Sorbaview causes rash  . Tegaderm Ag Mesh [Silver]     Blisters  . Erythromycin Nausea And Vomiting   Current Facility-Administered Medications on File Prior to Encounter  Medication Dose Route Frequency Provider Last Rate Last Dose  . 0.9 %  sodium chloride infusion   Intravenous Continuous Kinnie Feil, MD 20 mL/hr at 04/19/13 1500 500 mL at 04/19/13 1500   Current Outpatient Prescriptions on File Prior to Encounter  Medication Sig Dispense Refill  . blood glucose meter kit and supplies KIT Dispense based on patient and insurance preference. Use up to four times daily as directed. (FOR ICD-9 250.00, 250.01). 1 each 0  . carvedilol (COREG) 25 MG tablet Take 1 tablet (25 mg total) by mouth 2 (two) times daily with a meal. 30 tablet 5  . cetirizine (ZYRTEC) 10 MG tablet  Take 1 tablet (10 mg total) by mouth daily. 30 tablet 11  . insulin glargine (LANTUS) 100 UNIT/ML injection Inject 0.22 mLs (22 Units total) into the skin daily. 10 mL 2  . lisinopril (PRINIVIL,ZESTRIL) 40 MG tablet Take 1 tablet (40 mg total) by mouth daily. 30 tablet 2  . LORazepam (ATIVAN) 0.5 MG tablet Take 1 tablet (0.5 mg total) by mouth every 4 (four) hours as needed (nausea, vomiting). 30 tablet 0  . oxyCODONE (ROXICODONE) 5 MG immediate release tablet Take 1 tablet (5 mg total) by mouth every 4 (four) hours as needed for severe pain. 5 tablet 0  . polyethylene glycol (MIRALAX / GLYCOLAX) packet Take 17 g by mouth daily as needed. May increase in 2-3 days to 2 times daily, Until stooling regularly 30 packet prn  . pregabalin (LYRICA) 25 MG capsule TAKE 1 TABLET BY MOUTH AT BEDTIME AS NEEDED 30 capsule 2  . promethazine (PHENERGAN) 12.5 MG tablet Take 1 tablet (12.5 mg total) by mouth every 4 (four) hours as needed for nausea or vomiting. 30 tablet 5  . promethazine (PHENERGAN) 25 MG suppository Place 1 suppository (25 mg total) rectally every 6 (six) hours as needed for nausea or vomiting. 12 each 0  . aspirin EC 81 MG tablet Take 1 tablet (81 mg total) by mouth daily. (Patient not taking: Reported on 10/15/2015) 90 tablet 3  . hydrALAZINE (APRESOLINE) 10 MG tablet Take 1 tablet (10 mg total) by mouth 3 (three) times daily. (Patient not taking: Reported on 10/15/2015) 30 tablet 5  . LORazepam (ATIVAN) 1 MG tablet Take 1 tablet (1 mg total) by mouth 2 (two) times daily as needed (cyclic vomiting symptoms). 30 tablet 0    Objective: BP 154/102 mmHg  Pulse 88  Temp(Src) 98.8 F (37.1 C) (Oral)  Resp 22  SpO2 100% Exam: General: Tired-appearing female, laying in bed on her side, in NAD Eyes: PERRLA, EOMI ENTM: Moist mucous membranes, throat appears normal. Neck: Supple, no lymphadenopathy Cardiovascular: RRR, no murmurs, 2+ DP pulses bilaterally Respiratory: CTAB, no wheezes, normal  work of breathing Abdomen: +BS, soft, generalized tenderness throughout with increased tenderness in the epigastric area. MSK: No edema, moves all 4 limbs spontaneously Skin: Warm and dry, no rashes or lesions. Neuro: Awake, alert, oriented, CN 2-12 grossly intact Psych: Judgement and cognition appear intact  Labs and Imaging: CBC BMET   Recent Labs Lab 11/15/15 1300  WBC 7.5  HGB 12.8  HCT 36.4  PLT 191    Recent Labs Lab 11/15/15 1300  NA 129*  K 4.1  CL 93*  CO2 26  BUN 34*  CREATININE 2.39*  GLUCOSE 430*  CALCIUM 9.5     Lipase 56 UA: Many bacteria, >1000 glucose, moderate Hgb, negative ketones, moderate leukocytes, positive nitrites, TNTC WBC. UDS: positive for benzos and THC  Sela Hua, MD 11/15/2015, 3:54 PM PGY-1, [place] Intern pager: [phone], text pages welcome  I have seen and examined the patient. I have read and agree with the above note. My changes are noted in blue.  Cordelia Poche, MD PGY-3, [facility] 11/15/2015, 11:50 PM

## 2015-11-16 DIAGNOSIS — N3090 Cystitis, unspecified without hematuria: Secondary | ICD-10-CM | POA: Insufficient documentation

## 2015-11-16 LAB — BASIC METABOLIC PANEL
Anion gap: 5 (ref 5–15)
BUN: 31 mg/dL — AB (ref 6–20)
CALCIUM: 8.6 mg/dL — AB (ref 8.9–10.3)
CO2: 28 mmol/L (ref 22–32)
Chloride: 104 mmol/L (ref 101–111)
Creatinine, Ser: 1.94 mg/dL — ABNORMAL HIGH (ref 0.44–1.00)
GFR calc Af Amer: 34 mL/min — ABNORMAL LOW (ref >60)
GFR, EST NON AFRICAN AMERICAN: 29 mL/min — AB (ref >60)
GLUCOSE: 71 mg/dL (ref 65–99)
Potassium: 3.6 mmol/L (ref 3.5–5.1)
Sodium: 137 mmol/L (ref 135–145)

## 2015-11-16 LAB — GLUCOSE, CAPILLARY
GLUCOSE-CAPILLARY: 83 mg/dL (ref 65–99)
GLUCOSE-CAPILLARY: 224 mg/dL — AB (ref 65–99)
GLUCOSE-CAPILLARY: 43 mg/dL — AB (ref 65–99)
Glucose-Capillary: 124 mg/dL — ABNORMAL HIGH (ref 65–99)
Glucose-Capillary: 145 mg/dL — ABNORMAL HIGH (ref 65–99)
Glucose-Capillary: 62 mg/dL — ABNORMAL LOW (ref 65–99)

## 2015-11-16 MED ORDER — HYDROMORPHONE HCL 1 MG/ML IJ SOLN
0.5000 mg | INTRAMUSCULAR | Status: DC | PRN
  Administered 2015-11-16 – 2015-11-17 (×3): 0.5 mg via INTRAVENOUS
  Filled 2015-11-16 (×3): qty 1

## 2015-11-16 MED ORDER — OXYCODONE HCL 5 MG PO TABS
5.0000 mg | ORAL_TABLET | ORAL | Status: DC | PRN
  Administered 2015-11-16 – 2015-11-17 (×4): 5 mg via ORAL
  Filled 2015-11-16 (×5): qty 1

## 2015-11-16 NOTE — Progress Notes (Signed)
Hypoglycemic Event  CBG: 43  Treatment: 15 GM carbohydrate snack  Symptoms: None  Follow-up CBG: Time: 2237 CBG Result: 62  Possible Reasons for Event: Inadequate meal intake  Comments/MD notified: Notified MD on call    [name] [name] N Hypoglycemic Event  CBG: 62 @ 2237  Treatment: 15 GM carbohydrate snack  Symptoms: None  Follow-up CBG: Time:2330 CBG Result:145  Possible Reasons for Event: Inadequate meal intake  Comments/MD notified:Notified MD on-call    [name] [name]

## 2015-11-16 NOTE — Progress Notes (Signed)
FPTS Interim Progress Note  S: Evaluated patient for left sided jaw pain. Patient is tearful and some distress. States she has had this pain all day. Notes of left jaw pain, left ear pain and states area around her left eye is sore. States she feels swollen in her left neck and jaw area, has some pain with swallowing. Roxicodone is not relieving pain. Denies fevers or chills.   O: BP 145/79 mmHg  Pulse 70  Temp(Src) 99.3 F (37.4 C) (Oral)  Resp 16  Ht 5' 2.5" (1.588 m)  Wt 138 lb 10.7 oz (62.9 kg)  BMI 24.94 kg/m2  SpO2 98%  Gen: tearful and appears to be in some distress Oropharynx: poor dentition however no overt signs of abscess around teeth, no overt signs of swelling; tender to palpation of left cheek.  Neck: tender to palpation of left neck and jaw; lymphadenopathy noted on left neck.   A/P: No overt signs of infection on exam although with poor dentition may increased risk of having an infection.Vitals are stable. Patient is afebrile.  - will continue to monitor - will add on Dilaudid 0.5mg  q 3hr PRN for pain control - no indication for STAT imaging; will discuss with day team regarding imaging in the AM   Smiley Houseman, MD 11/16/2015, 10:23 PM PGY-1, [place] Medicine Service pager [phone]

## 2015-11-16 NOTE — Progress Notes (Signed)
Family Medicine Teaching Service Daily Progress Note Intern Pager: [phone]  Patient name: [name] [name] Medical record number: [id number] Date of birth: [birth year] Age: 49 y.o. Gender: female  Primary Care Provider: Phill Myron, MD Consultants: None Code Status: Full  Pt Overview and Major Events to Date:  11/15/15 - patient started on rocephin for UTI  Assessment and Plan: [name] [name] is a 49 y.o. female presenting with nausea and vomiting. PMH is significant for cyclic vomiting syndrome, marijuana abuse, major depression, HTN, DM, CKD III. UA consistent with UTI.   Cyclic vomiting and abdominal pain: Long history of cyclic vomiting associated with marijuana use. UDS was positive for THC. Nausea and vomiting were unrelieved with Phenergan suppositories and Ativan at home. Pt endorsing some blood streaks in the vomit and some brown emesis with red tint. Possibly due to Mallory-Weiss tears. Hemoglobin stable. On exam, she has moderate epigastric pain. Received 1L of fluids in the ED. Patient says she has had vomiting since her hysterectomy 13 years ago, after which she had to have repeat surgery for a bowel obstruction. She does not believe marijuana is the underlying cause.  - Discontinue MIVFs, as patient has been able to keep down fluids this morning.  - Ativan 1mg  po bid prn and Compazine 10mg  q6hrs prn for nausea/vomiting - Morphine 4mg  q3hrs prn for abdominal pain. - Protonix 40mg  daily - Will get FOBT and gastroccult to rule out GI bleed. - Advance diet as tolerated. - Strict I/O's.  Urinary tract infection: Pt endorses discomfort with urination recently. UA significant for many bacteria, moderate leukocytes, positive nitrites, TNTC WBC. No fevers or CVA tenderness to suggest pyelonephritis - Rocephin 1g daily. Anticipate switching to PO tomorrow, 11/21. - Urine culture pending  HTN: BPs improved since admission. High of 154/75 overnight. 115/65 this a.m. - Continue home  meds: Carvedilol 25mg  bid, Hydralazine 10mg  tid - Holding home Lisinopril in the setting of AKI  Hyponatremia: Resolved at 137, was 129 on admission. Most likely secondary to volume depletion from vomiting  T2DM with peripheral neuropathy: Last HgbA1c 9.6%. A.M. CBG was 83 am of 11/20.  - Increase lantus to home dose of 22 units once eating solids, currently on 12 units  - CBGs with meals and at bedtime - Continue home med: Lyrica 25mg  daily for neuropathy  AKI on CKD III: Resolved. Cr 1.94 < 2.39. Baseline around 2.0. Likely secondary to volume depletion. - Holding home Lisinopril in the setting of AKI - Avoid nephrotoxic medications - Monitor with daily BMETs  FEN/GI: Advance diet as tolerated Prophylaxis: Lovenox  Disposition: Admit to [facility], attending Dr. Erin [name]. Discharge home once she can tolerate PO.  Subjective:  Patient remains afebrile and is feeling better this morning. She states her nausea has improved and she has been able to keep liquids down. Her back pain is no longer sharp and intense but now just occasionally throbbing.  Objective: Temp:  [97.8 F (36.6 C)-98.8 F (37.1 C)] 97.8 F (36.6 C) (11/20 0505) Pulse Rate:  [62-102] 62 (11/20 0505) Resp:  [16-22] 16 (11/20 0505) BP: (115-192)/(65-102) 115/65 mmHg (11/20 0505) SpO2:  [100 %] 100 % (11/20 0505) Weight:  [138 lb 10.7 oz (62.9 kg)] 138 lb 10.7 oz (62.9 kg) (11/19 1839) Physical Exam: General: Tired appearing woman in NAD Cardiovascular: RRR, S1, S2, no m/r/g Respiratory: CTAB Abdomen: Very TTP at epigastric region MSK: Midline TTP above gluteal fold and left lower back; no CVA tenderness Extremities: FROM  Laboratory:  Recent Labs  Lab 11/12/15 1103 11/12/15 1608 11/15/15 1300  WBC 14.2*  --  7.5  HGB 11.3* 12.6 12.8  HCT 32.8* 37.0 36.4  PLT 191  --  191    Recent Labs Lab 11/12/15 1103 11/12/15 1608 11/15/15 1300 11/16/15 0500  NA 141 142 129* 137  K 4.5 3.8 4.1 3.6  CL 106 103  93* 104  CO2 18*  --  26 28  BUN 41* 46* 34* 31*  CREATININE 2.84* 2.80* 2.39* 1.94*  CALCIUM 9.9  --  9.5 8.6*  PROT 7.5  --  7.4  --   BILITOT 0.9  --  1.1  --   ALKPHOS 91  --  81  --   ALT 17  --  15  --   AST 27  --  17  --   GLUCOSE 435* 492* 430* 71   Lipase 56 UA: Many bacteria, >1000 glucose, moderate Hgb, negative ketones, moderate leukocytes, positive nitrites, TNTC WBC. UDS: positive for benzos and THC  Imaging/Diagnostic Tests:  EKG 11/19 negative for acute changes, evidence of old anteroseptal infarct  [name] Bussing, MD 11/16/2015, 7:10 AM PGY-1, [place] Intern pager: [phone], text pages welcome

## 2015-11-16 NOTE — Progress Notes (Signed)
Visited patient at bedside for new complaint of left-sided jaw pain. She is tearful and in moderate distress. Swelling of left post-auricular lymph nodes present. Patient having pain "in a triangle" from her ear to her jaw to her cheek with painful swallowing. Patient did not have this pain on exam around 1000 am this morning. However, she says she has been uncomfortable with jaw pain for several hours. On exam, back molars have been removed but there is some dark discoloration of the gums of the left lower jaw. She says she has not had a toothache in years.   She was receiving her second dose of IV ceftriaxone.   Prescribed roxicodone 5 mg q4h prn, which patient takes at home for back pain. She states she cannot take anything with tylenol because it causes nausea/vomiting and ibuprofen could be harmful to renal function. She states she usually gets dilaudid for pain in the hospital.

## 2015-11-16 NOTE — Discharge Summary (Signed)
[facility] Discharge Summary  Patient name: [name] [name] Medical record number: [id number] Date of birth: [birth year] Age: 49 y.o. Gender: female Date of Admission: 11/15/2015  Date of Discharge: 11/17/15 Admitting Physician: Lind Covert, MD  Primary Care Provider: Phill Myron, MD Consultants: None  Indication for Hospitalization: intractible nausea and vomiting  Discharge Diagnoses/Problem List:  Active Problems:   Cyclic vomiting syndrome   Vomiting   Cystitis   T2DM   CKD III   MDD   HTN   Chronic lower back pain   Left-sided tooth pain  Surgical history significant for:  Hysterectomy 2003 Laparotomy and lysis of adhesions Cholecystectomy 2010 Cesarean section  Disposition: Home  Discharge Condition: Stable  Discharge Exam:  Blood pressure 129/55, pulse 52, temperature 99 F (37.2 C), temperature source Oral, resp. rate 17, height 5' 2.5" (1.588 m), weight 138 lb 10.7 oz (62.9 kg), SpO2 100 %.  Physical Exam: General: Tired appearing woman in mild distress HEENT: Left-sided postauricular lymphadenopathy with left-sided tooth pain and throat pain. Discoloration of gums (molars removed) of left mandible. No erythema of throat. Slight swelling of left cheek compared to right.  Cardiovascular: RRR, S1, S2, no m/r/g Respiratory: CTAB Abdomen: TTP at epigastric region (stable from initial exam, chronic pain) MSK: Mild midline TTP above gluteal fold and left lower back; no CVA tenderness Extremities: FROM   Brief Hospital Course:  [name] [name] is a 49 y.o. female presenting with nausea and vomiting with associated back and epigastric pain. PMH is significant for cyclic vomiting syndrome, marijuana abuse with decreased use over the past few months, major depression, HTN, DM, and CKD III. She had not been able to keep fluids down for 5 days prior to admission and had not been taking any of her diabetes or antihypertensive  medications. Nausea persisted despite use of phenergan suppositories and ativan at home. She also had been experiencing sensation of pressure with urination and had concern for tinges of blood in sputum and stool.   On admission, CBG was elevated at 379. Her UA was significant for many bacteria, >1000 glucose, moderate Hgb, moderate leukocytes, positive nitrites, TNTC WBC. UDS was positive for benzos and THC. Lipase 56. EKG showed normal QT interval. She was given reglan and ativan in the ED, which helped with emesis but not nausea. She had no fever or CVA tenderness to suggest pyelonephritis. She was started on IVFs and given prn ativan and compazine for nausea/vomting. She also received prn morphine 4 mg for abdominal pain. She was started on rocephin on 11/15/15 for her UTI. Patient was able to tolerate PO on 11/20. She was transitioned to PO Keflex on 11/17/15, at which time lower back pain and active abdominal pain had largely resolved. Patient did not have BM or emesis to collect FOBT or gastroccult to rule out GI bleed.    Patient expressed concern that her recurrent nausea and vomiting is not due to marijuana wean and that its onset coincides with hysterectomy that was complicated by bowel obstruction 13 years ago. She has been previously worked up with upper GI endoscopy and several Abdominal CT scans. She had seen several years ago by Jessie.   Hospitalization was complicated by hyponatremia to 129 and AKI with SCr to 2.39 with baseline of 2.0. Both resolved with IVFs. Lisinopril was originally held due to AKI but was restarted for BPs elevated to systolic in the 545G-256L. Additionally, patient developed a left-sided toothache on 11/20. Orthopantogram was  negative for periapical abscess.   Seven-day course of antibiotics for UTI to end 11/20/25 with prescribed Keflex. Patient's home anti-nausea medications and pain medicine continued at discharge: ativan 0.5 mg q4h prn, phenergan tablets  and suppositories, roxicodone 5 mg q4h for severe pain and lyrica 25 mg daily for neuropathy 2/2 T2DM. Patient was discharged on home lantus regimen of 22 units.  `  Issues for Follow Up:  1. Patient's ability to tolerate PO after discharge and frequency of emesis. 2. BMET to follow-up hyponatremia and SCr for AKI. 3. Inquire about resolution of jaw pain. Any fevers or continued swelling to indicate parotitis and need for additional antibiotic treatment. 4. Ask about any episodes of hypoglycemia given frequent bouts of nausea and emesis that result in decreased PO.   Significant Procedures: None  Significant Labs and Imaging:   Recent Labs Lab 11/12/15 1103 11/12/15 1608 11/15/15 1300  WBC 14.2*  --  7.5  HGB 11.3* 12.6 12.8  HCT 32.8* 37.0 36.4  PLT 191  --  191    Recent Labs Lab 11/12/15 1103 11/12/15 1608 11/15/15 1300 11/16/15 0500 11/17/15 0910  NA 141 142 129* 137 133*  K 4.5 3.8 4.1 3.6 4.3  CL 106 103 93* 104 101  CO2 18*  --  _0 GLUCOSE 435* 492* 430* 71 226*  BUN 41* 46* 34* 31* 24*  CREATININE 2.84* 2.80* 2.39* 1.94* 1.92*  CALCIUM 9.9  --  9.5 8.6* 9.0  ALKPHOS 91  --  81  --   --   AST 27  --  17  --   --   ALT 17  --  15  --   --   ALBUMIN 4.5  --  4.1  --   --    Lipase 56 UA: Many bacteria, >1000 glucose, moderate Hgb, negative ketones, moderate leukocytes, positive nitrites, TNTC WBC. UDS: positive for benzos and THC  Dg Orthopantogram  11/17/2015  CLINICAL DATA:  Two headache. Tenderness of the roof of the mouth on the left. Initial encounter. EXAM: ORTHOPANTOGRAM/PANORAMIC COMPARISON:  None. FINDINGS: The patient is missing multiple teeth in there appear to be cavities of the incisors. No periapical lucency is identified to suggest abscess. There is no fracture. The mandibular condyles are located. IMPRESSION: Negative for evidence of periapical abscess. Electronically Signed   By: Inge Rise M.D.   On: 11/17/2015 14:16     Results/Tests Pending at Time of Discharge: None  Discharge Medications:    Medication List    TAKE these medications        aspirin EC 81 MG tablet  Take 1 tablet (81 mg total) by mouth daily.     blood glucose meter kit and supplies Kit  Dispense based on patient and insurance preference. Use up to four times daily as directed. (FOR ICD-9 250.00, 250.01).     carvedilol 25 MG tablet  Commonly known as:  COREG  Take 1 tablet (25 mg total) by mouth 2 (two) times daily with a meal.     cephALEXin 250 MG capsule  Commonly known as:  KEFLEX  Take 1 capsule (250 mg total) by mouth 4 (four) times daily.     cetirizine 10 MG tablet  Commonly known as:  ZYRTEC  Take 1 tablet (10 mg total) by mouth daily.     hydrALAZINE 10 MG tablet  Commonly known as:  APRESOLINE  Take 1 tablet (10 mg total) by mouth 3 (three) times daily.  insulin glargine 100 UNIT/ML injection  Commonly known as:  LANTUS  Inject 0.22 mLs (22 Units total) into the skin daily.     lisinopril 40 MG tablet  Commonly known as:  PRINIVIL,ZESTRIL  Take 1 tablet (40 mg total) by mouth daily.     LORazepam 0.5 MG tablet  Commonly known as:  ATIVAN  Take 1 tablet (0.5 mg total) by mouth every 4 (four) hours as needed (nausea, vomiting).     oxyCODONE 5 MG immediate release tablet  Commonly known as:  ROXICODONE  Take 1 tablet (5 mg total) by mouth every 4 (four) hours as needed for severe pain.     polyethylene glycol packet  Commonly known as:  MIRALAX / GLYCOLAX  Take 17 g by mouth daily as needed. May increase in 2-3 days to 2 times daily, Until stooling regularly     pregabalin 25 MG capsule  Commonly known as:  LYRICA  TAKE 1 TABLET BY MOUTH AT BEDTIME AS NEEDED     promethazine 25 MG suppository  Commonly known as:  PHENERGAN  Place 1 suppository (25 mg total) rectally every 6 (six) hours as needed for nausea or vomiting.     promethazine 12.5 MG tablet  Commonly known as:  PHENERGAN  Take  1 tablet (12.5 mg total) by mouth every 4 (four) hours as needed for nausea or vomiting.        Discharge Instructions: Please refer to Patient Instructions section of EMR for full details.  Patient was counseled important signs and symptoms that should prompt return to medical care, changes in medications, dietary instructions, activity restrictions, and follow up appointments.   Follow-Up Appointments: Patient to call for follow-up appointment.   Rogue Bussing, MD 11/18/2015, 9:55 PM PGY-1, [place]

## 2015-11-17 ENCOUNTER — Inpatient Hospital Stay (HOSPITAL_COMMUNITY): Payer: Medicare Other

## 2015-11-17 DIAGNOSIS — K0889 Other specified disorders of teeth and supporting structures: Secondary | ICD-10-CM

## 2015-11-17 DIAGNOSIS — G43A1 Cyclical vomiting, intractable: Secondary | ICD-10-CM

## 2015-11-17 LAB — BASIC METABOLIC PANEL
ANION GAP: 5 (ref 5–15)
BUN: 24 mg/dL — ABNORMAL HIGH (ref 6–20)
CALCIUM: 9 mg/dL (ref 8.9–10.3)
CO2: 27 mmol/L (ref 22–32)
Chloride: 101 mmol/L (ref 101–111)
Creatinine, Ser: 1.92 mg/dL — ABNORMAL HIGH (ref 0.44–1.00)
GFR calc non Af Amer: 29 mL/min — ABNORMAL LOW (ref >60)
GFR, EST AFRICAN AMERICAN: 34 mL/min — AB (ref >60)
GLUCOSE: 226 mg/dL — AB (ref 65–99)
POTASSIUM: 4.3 mmol/L (ref 3.5–5.1)
Sodium: 133 mmol/L — ABNORMAL LOW (ref 135–145)

## 2015-11-17 LAB — URINE CULTURE

## 2015-11-17 LAB — GLUCOSE, CAPILLARY
GLUCOSE-CAPILLARY: 203 mg/dL — AB (ref 65–99)
Glucose-Capillary: 212 mg/dL — ABNORMAL HIGH (ref 65–99)

## 2015-11-17 MED ORDER — LORAZEPAM 0.5 MG PO TABS: 0.5000 mg | ORAL_TABLET | ORAL | Status: DC | PRN

## 2015-11-17 MED ORDER — CEPHALEXIN 250 MG PO CAPS: 250.0000 mg | ORAL_CAPSULE | Freq: Four times a day (QID) | ORAL | Status: DC

## 2015-11-17 MED ORDER — LISINOPRIL 40 MG PO TABS
40.0000 mg | ORAL_TABLET | Freq: Every day | ORAL | Status: DC
Start: 2015-11-17 — End: 2015-11-17
  Administered 2015-11-17: 40 mg via ORAL
  Filled 2015-11-17: qty 1

## 2015-11-17 MED ORDER — CEPHALEXIN 250 MG PO CAPS
250.0000 mg | ORAL_CAPSULE | Freq: Four times a day (QID) | ORAL | Status: DC
  Administered 2015-11-17: 250 mg via ORAL
  Filled 2015-11-17 (×4): qty 1

## 2015-11-17 MED ORDER — PANTOPRAZOLE SODIUM 40 MG PO TBEC
40.0000 mg | DELAYED_RELEASE_TABLET | Freq: Every day | ORAL | Status: DC
  Administered 2015-11-17: 40 mg via ORAL
  Filled 2015-11-17: qty 1

## 2015-11-17 NOTE — Progress Notes (Signed)
Inpatient Diabetes Program Recommendations  AACE/ADA: New Consensus Statement on Inpatient Glycemic Control (2015)  Target Ranges:  Prepandial:   less than 140 mg/dL      Peak postprandial:   less than 180 mg/dL (1-2 hours)      Critically ill patients:  140 - 180 mg/dL  Results for [name] [name], [name] [name] (MRN [id number]) as of 11/17/2015 10:06  Ref. Range 11/16/2015 07:57 11/16/2015 12:09 11/16/2015 17:25 11/16/2015 21:54 11/16/2015 22:37 11/16/2015 23:30 11/17/2015 08:05  Glucose-Capillary Latest Ref Range: 65-99 mg/dL 83 124 (H) 224 (H) 43 (LL) 62 (L) 145 (H) 212 (H)   Review of Glycemic Control  Diabetes history: DM2 Outpatient Diabetes medications: Lantus 22 units daily Current orders for Inpatient glycemic control: Lantus 12 units daily, Novolog 0-15 units TID with meals, Novolog 0-5 units HS  Inpatient Diabetes Program Recommendations:  Insulin - Basal: Patient did NOT receive any basal insulin on 11/16/15 as patient refused Lantus 12 units yesterday morning. Fasting glucose is 212 mg/dl this morning. Please consider decreasing Lantus to 6 units daily (based on 62.9 kg x 0.1 units). Correction (SSI): Glucose was 224 mg/dl on 11/20 at 17:25 and patient recieved Novolog 5 units per moderate correction scale. The following glucose down to 43 mg/dl at 21:54 on 11/20. Please consider decreasing Novolog correction to Sensitive Correction Scale.  Thanks, Barnie Alderman, RN, MSN, CDE Diabetes Coordinator Inpatient Diabetes Program [phone] (Team Pager from [place] to [place]) [phone] ([place] office) [phone] [facility] office) [phone] [facility] office)

## 2015-11-17 NOTE — Progress Notes (Signed)
Family Medicine Teaching Service Daily Progress Note Intern Pager: [phone]  Patient name: [name] [name] Medical record number: [id number] Date of birth: 09/05/1966 Age: 49 y.o. Gender: female  Primary Care Provider: Phill Myron, MD Consultants: None Code Status: Full  Pt Overview and Major Events to Date:  11/15/15 - patient started on rocephin for UTI 11/17/15 - patient transitioned to PO Keflex  Assessment and Plan: [name] is a 49 y.o. female presenting with nausea and vomiting. PMH is significant for cyclic vomiting syndrome, marijuana abuse, major depression, HTN, DM, CKD III. UA consistent with UTI.   Cyclic vomiting and abdominal pain (Resolved): Long history of cyclic vomiting associated with marijuana use. UDS was positive for THC. Nausea and vomiting were unrelieved with Phenergan suppositories and Ativan at home. Pt endorsing some blood streaks in the vomit and some brown emesis with red tint. Possibly due to Mallory-Weiss tears. Hemoglobin stable. On exam, she has moderate epigastric pain. Received 1L of fluids in the ED. Patient says she has had vomiting since her hysterectomy 13 years ago, after which she had to have repeat surgery for a bowel obstruction. She does not believe marijuana is the underlying cause.  - Pt now tolerating PO. Advance diet as tolerated. - Ativan 1mg  po bid prn and Compazine 10mg  q6hrs prn for nausea/vomiting. Has not needed since morning of 11/20.  - Protonix 40mg  daily. - Will get FOBT and gastroccult to rule out GI bleed, as able - Strict I/O's. (I: 840/ O: 3500)  Urinary tract infection: Pt endorses discomfort with urination recently. UA significant for many bacteria, moderate leukocytes, positive nitrites, TNTC WBC. No fevers or CVA tenderness to suggest pyelonephritis - Rocephin 1g daily. Has received 2 doses.  - Transition to PO keflex 11/21 for 5 more days of abx treatment.  - Urine culture pending (too young to read)  Left jaw  pain: Began morning of 11/20. Poor dentition. Also with pressure behind left eye and ear. Patient complaining of difficulty swallowing due to pain.  - Required 2 doses of prn dilaudid overnight - Ordered orthopantogram to assess for dental abscess - If negative, may consider CT face/sinuses to check for partotitis  - Obtain a.m. CBC if patient remains hospitalized   HTN: BP to 172/80 this a.m. - Continue home meds: Carvedilol 25mg  bid, Hydralazine 10mg  tid - Resume home Lisinopril, given improvement in SCr  Hyponatremia: Improved. 133 <137 <129 on admission. Most likely secondary to volume depletion from vomiting - Continue to monitor with BMETs  T2DM with peripheral neuropathy: Last HgbA1c 9.6%. A.M. CBG of 43 overnight. CBG 212 this am.  - Currently on 12 units (home dose 22 units) - CBGs with meals and at bedtime - Continue home med: Lyrica 25mg  daily for neuropathy  AKI on CKD III: Resolved. Cr 1.92 < 1.94 < 2.39. Baseline around 2.0. Likely secondary to volume depletion. - Initially held home lisinopril - Avoid nephrotoxic medications - Monitor with daily BMETs  FEN/GI: Advance diet as tolerated Prophylaxis: Lovenox  Disposition: Admitted to med-surg, attending Dr. Mingo Amber. Discharge home once she can tolerate PO and pain is moderately controlled. Now evaluating jaw pain to assess need for inpatient vs outpatient management.   Subjective:  Patient remains afebrile. Nausea and vomiting resolved, but she is now having pain with swallowing due to left-sided swelling of her jaw. She is able to tolerate PO. She denies active abdominal pain but does endorse TTP on exam. Back pain has resolved.   Objective: Temp:  [  97.6 F (36.4 C)-99.3 F (37.4 C)] 98.4 F (36.9 C) (11/21 [id number]) Pulse Rate:  [57-70] 61 (11/21 0614) Resp:  [16] 16 (11/21 0614) BP: (145-172)/(75-80) 172/80 mmHg (11/21 0614) SpO2:  [98 %-100 %] 99 % (11/21 [id number]) Physical Exam: General: Tired appearing woman in mild  distress HEENT: Left-sided postauricular lymphadenopathy with tenderness at jawline and behind ear. Discoloration of gums (molars removed) of left mandible. No erythema of throat. Slight swelling of left cheek compared to right.  Cardiovascular: RRR, S1, S2, no m/r/g Respiratory: CTAB Abdomen: Very TTP at epigastric region (stable from initial exam, chronic pain) MSK: Mild midline TTP above gluteal fold and left lower back; no CVA tenderness Extremities: FROM   Laboratory:  Recent Labs Lab 11/12/15 1103 11/12/15 1608 11/15/15 1300  WBC 14.2*  --  7.5  HGB 11.3* 12.6 12.8  HCT 32.8* 37.0 36.4  PLT 191  --  191    Recent Labs Lab 11/12/15 1103 11/12/15 1608 11/15/15 1300 11/16/15 0500  NA 141 142 129* 137  K 4.5 3.8 4.1 3.6  CL 106 103 93* 104  CO2 18*  --  26 28  BUN 41* 46* 34* 31*  CREATININE 2.84* 2.80* 2.39* 1.94*  CALCIUM 9.9  --  9.5 8.6*  PROT 7.5  --  7.4  --   BILITOT 0.9  --  1.1  --   ALKPHOS 91  --  81  --   ALT 17  --  15  --   AST 27  --  17  --   GLUCOSE 435* 492* 430* 71   Lipase 56 UA: Many bacteria, >1000 glucose, moderate Hgb, negative ketones, moderate leukocytes, positive nitrites, TNTC WBC. UDS: positive for benzos and THC  Imaging/Diagnostic Tests:  EKG 11/19 negative for acute changes, evidence of old anteroseptal infarct  Rogue Bussing, MD 11/17/2015, 7:35 AM PGY-1, [place] Intern pager: [phone], text pages welcome

## 2015-11-17 NOTE — Care Management Note (Signed)
Case Management Note  Patient Details  Name: [name] [name] MRN: [id number] Date of Birth: [birth year]  Subjective/Objective:                  Date-11-17-15 Initial Assessment Spoke with patient at the bedside Introduced self as case manager and explained role in discharge planning and how to be reached.  Verified patient anticipates to go home alone, at time of discharge.  Patient has no DME. Expressed potential need for no other DME.  Patient denied  needing help with their medication.  Patient drives to MD appointments.  Verified patient has PCP Dr Berkley Harvey Plan: CM will continue to follow for discharge planning and [facility] resources.   Carles Collet RN BSN CM [phone]   Action/Plan:   Expected Discharge Date:                  Expected Discharge Plan:  Home/Self Care  In-House Referral:     Discharge planning Services  CM Consult  Post Acute Care Choice:    Choice offered to:     DME Arranged:    DME Agency:     HH Arranged:    HH Agency:     Status of Service:  In process, will continue to follow  Medicare Important Message Given:    Date Medicare IM Given:    Medicare IM give by:    Date Additional Medicare IM Given:    Additional Medicare Important Message give by:     If discussed at [place] of Stay Meetings, dates discussed:    Additional Comments:  Carles Collet, RN 11/17/2015, 11:01 AM

## 2015-11-17 NOTE — Clinical Social Work Note (Signed)
Clinical Social Work Assessment  Patient Details  Name: [name] [name] MRN: [id number] Date of Birth: 1965-12-28  Date of referral:  11/17/15               Reason for consult:  Substance Use/ETOH Abuse                Permission sought to share information with:  Chartered certified accountant granted to share information::  No  Name::        Agency::     Relationship::     Contact Information:     Housing/Transportation Living arrangements for the past 2 months:  Single Family Home Source of Information:  Patient Patient Interpreter Needed:  None Criminal Activity/Legal Involvement Pertinent to Current Situation/Hospitalization:  No - Comment as needed Significant Relationships:  None Lives with:  Self Do you feel safe going back to the place where you live?  Yes Need for family participation in patient care:  No (Coment)  Care giving concerns:  CSW spoke w/ patient regarding substance abuse. Patient expressed wanting help to reduce marijuana use. Patient prefers an outpatient setting for substance abuse tx.    Social Worker assessment / plan:  CSW explored motivation for patient to reduce substance use. CSW provided patient w/ list of community outpatient resources for substance abuse treatment.   Employment status:  Disabled (Comment on whether or not currently receiving Disability) Insurance information:  Managed Medicare PT Recommendations:  Not assessed at this time Information / Referral to community resources:  Outpatient Substance Abuse Treatment Options  Patient/Family's Response to care:  Patient was tearful when reporting that marijuana use was started in response to pain from a surgery years ago. She reports that every time she tries to stop the marijuana, she ends up in the hospital throwing up.  She would like to reduce her substance use due to spiritual reasons.   Patient/Family's Understanding of and Emotional Response to Diagnosis, Current Treatment,  and Prognosis:  Patient is hopeful that she will be able to reduce her substance use.   Emotional Assessment Appearance:  Appears stated age Attitude/Demeanor/Rapport:    Affect (typically observed):  Tearful/Crying, Appropriate Orientation:  Oriented to Self, Oriented to Place, Oriented to  Time, Oriented to Situation Alcohol / Substance use:  Illicit Drugs Psych involvement (Current and /or in the community):  No (Comment)  Discharge Needs  Concerns to be addressed:  Substance Abuse Concerns Readmission within the last 30 days:  No Current discharge risk:  None, Substance Abuse Barriers to Discharge:  Active Substance Use   Benard Halsted, LCSW 11/17/2015, 1:44 PM

## 2015-11-17 NOTE — Progress Notes (Signed)
[name] [name] be D/C'd Home per MD order. Discussed with the patient and all questions fully answered.  VSS, Skin clean, dry and intact without evidence of skin break down, no evidence of skin tears noted. IV port-a-cath discontinued/flushed by IV team nurse. Site without signs and symptoms of complications. Dressing and pressure applied.  An After Visit Summary was printed along with PRN script for Ativan and given to the patient. Prescriptions sent to [facility] on [facility].  D/c education completed with patient/family including follow up instructions, medication list, d/c activities limitations if indicated, with other d/c instructions as indicated by MD - patient able to verbalize understanding, all questions fully answered.   Patient instructed to return to ED, call 911, or call MD for any changes in condition.   Patient escorted via wheelchair with nurse tech, and D/C home via private auto with family.

## 2015-11-19 ENCOUNTER — Telehealth: Payer: Self-pay | Admitting: Family Medicine

## 2015-11-19 NOTE — Telephone Encounter (Signed)
Pt was discharged from [facility] on 11-17-15 Diagnosis [id number]  Cyclical vomiting

## 2015-11-19 NOTE — Telephone Encounter (Signed)
Will forward to PCP for FYI. Katharina Caper, [name], [place]

## 2015-11-22 ENCOUNTER — Encounter (HOSPITAL_COMMUNITY): Payer: Self-pay | Admitting: Emergency Medicine

## 2015-11-22 ENCOUNTER — Observation Stay (HOSPITAL_COMMUNITY)
Admission: EM | Admit: 2015-11-22 | Discharge: 2015-11-24 | Disposition: A | Discharge status: Home / Self Care | Payer: Medicare Other | Attending: Family Medicine | Admitting: Family Medicine

## 2015-11-22 ENCOUNTER — Observation Stay (HOSPITAL_COMMUNITY): Payer: Medicare Other

## 2015-11-22 DIAGNOSIS — E875 Hyperkalemia: Secondary | ICD-10-CM | POA: Diagnosis not present

## 2015-11-22 DIAGNOSIS — E785 Hyperlipidemia, unspecified: Secondary | ICD-10-CM | POA: Diagnosis not present

## 2015-11-22 DIAGNOSIS — Z794 Long term (current) use of insulin: Secondary | ICD-10-CM | POA: Insufficient documentation

## 2015-11-22 DIAGNOSIS — D649 Anemia, unspecified: Secondary | ICD-10-CM | POA: Diagnosis not present

## 2015-11-22 DIAGNOSIS — K5900 Constipation, unspecified: Secondary | ICD-10-CM | POA: Diagnosis not present

## 2015-11-22 DIAGNOSIS — K3184 Gastroparesis: Secondary | ICD-10-CM | POA: Diagnosis not present

## 2015-11-22 DIAGNOSIS — E869 Volume depletion, unspecified: Secondary | ICD-10-CM | POA: Insufficient documentation

## 2015-11-22 DIAGNOSIS — K1370 Unspecified lesions of oral mucosa: Secondary | ICD-10-CM | POA: Diagnosis present

## 2015-11-22 DIAGNOSIS — N179 Acute kidney failure, unspecified: Secondary | ICD-10-CM | POA: Diagnosis not present

## 2015-11-22 DIAGNOSIS — E1143 Type 2 diabetes mellitus with diabetic autonomic (poly)neuropathy: Secondary | ICD-10-CM | POA: Diagnosis not present

## 2015-11-22 DIAGNOSIS — E1142 Type 2 diabetes mellitus with diabetic polyneuropathy: Secondary | ICD-10-CM | POA: Diagnosis not present

## 2015-11-22 DIAGNOSIS — I129 Hypertensive chronic kidney disease with stage 1 through stage 4 chronic kidney disease, or unspecified chronic kidney disease: Secondary | ICD-10-CM | POA: Diagnosis not present

## 2015-11-22 DIAGNOSIS — N390 Urinary tract infection, site not specified: Secondary | ICD-10-CM | POA: Diagnosis not present

## 2015-11-22 DIAGNOSIS — N183 Chronic kidney disease, stage 3 (moderate): Secondary | ICD-10-CM | POA: Insufficient documentation

## 2015-11-22 DIAGNOSIS — Z79899 Other long term (current) drug therapy: Secondary | ICD-10-CM | POA: Diagnosis not present

## 2015-11-22 DIAGNOSIS — F17210 Nicotine dependence, cigarettes, uncomplicated: Secondary | ICD-10-CM | POA: Diagnosis not present

## 2015-11-22 DIAGNOSIS — F1210 Cannabis abuse, uncomplicated: Secondary | ICD-10-CM | POA: Insufficient documentation

## 2015-11-22 DIAGNOSIS — E1122 Type 2 diabetes mellitus with diabetic chronic kidney disease: Secondary | ICD-10-CM | POA: Insufficient documentation

## 2015-11-22 DIAGNOSIS — K1379 Other lesions of oral mucosa: Secondary | ICD-10-CM | POA: Diagnosis not present

## 2015-11-22 DIAGNOSIS — K121 Other forms of stomatitis: Secondary | ICD-10-CM | POA: Diagnosis not present

## 2015-11-22 DIAGNOSIS — K219 Gastro-esophageal reflux disease without esophagitis: Secondary | ICD-10-CM | POA: Insufficient documentation

## 2015-11-22 DIAGNOSIS — G43A0 Cyclical vomiting, not intractable: Secondary | ICD-10-CM | POA: Insufficient documentation

## 2015-11-22 DIAGNOSIS — Z7982 Long term (current) use of aspirin: Secondary | ICD-10-CM | POA: Diagnosis not present

## 2015-11-22 DIAGNOSIS — I252 Old myocardial infarction: Secondary | ICD-10-CM | POA: Diagnosis not present

## 2015-11-22 LAB — CBC WITH DIFFERENTIAL/PLATELET
Basophils Absolute: 0 10*3/uL (ref 0.0–0.1)
Basophils Relative: 0 %
Eosinophils Absolute: 0.1 10*3/uL (ref 0.0–0.7)
Eosinophils Relative: 2 %
HCT: 28.5 % — ABNORMAL LOW (ref 36.0–46.0)
HEMOGLOBIN: 9.8 g/dL — AB (ref 12.0–15.0)
LYMPHS ABS: 2.5 10*3/uL (ref 0.7–4.0)
LYMPHS PCT: 37 %
MCH: 31.3 pg (ref 26.0–34.0)
MCHC: 34.4 g/dL (ref 30.0–36.0)
MCV: 91.1 fL (ref 78.0–100.0)
Monocytes Absolute: 0.6 10*3/uL (ref 0.1–1.0)
Monocytes Relative: 8 %
Neutro Abs: 3.6 10*3/uL (ref 1.7–7.7)
Neutrophils Relative %: 53 %
Platelets: 204 10*3/uL (ref 150–400)
RBC: 3.13 MIL/uL — AB (ref 3.87–5.11)
RDW: 13.5 % (ref 11.5–15.5)
WBC: 6.8 10*3/uL (ref 4.0–10.5)

## 2015-11-22 LAB — COMPREHENSIVE METABOLIC PANEL
ALK PHOS: 68 U/L (ref 38–126)
ALT: 15 U/L (ref 14–54)
AST: 12 U/L — ABNORMAL LOW (ref 15–41)
Albumin: 3.6 g/dL (ref 3.5–5.0)
Anion gap: 7 (ref 5–15)
BUN: 37 mg/dL — AB (ref 6–20)
CALCIUM: 9.3 mg/dL (ref 8.9–10.3)
CO2: 25 mmol/L (ref 22–32)
CREATININE: 2.51 mg/dL — AB (ref 0.44–1.00)
Chloride: 105 mmol/L (ref 101–111)
GFR, EST AFRICAN AMERICAN: 25 mL/min — AB (ref >60)
GFR, EST NON AFRICAN AMERICAN: 21 mL/min — AB (ref >60)
Glucose, Bld: 305 mg/dL — ABNORMAL HIGH (ref 65–99)
Potassium: 4.7 mmol/L (ref 3.5–5.1)
Sodium: 137 mmol/L (ref 135–145)
Total Bilirubin: 0.4 mg/dL (ref 0.3–1.2)
Total Protein: 6.5 g/dL (ref 6.5–8.1)

## 2015-11-22 LAB — URINALYSIS, ROUTINE W REFLEX MICROSCOPIC
BILIRUBIN URINE: NEGATIVE
GLUCOSE, UA: 500 mg/dL — AB
HGB URINE DIPSTICK: NEGATIVE
Ketones, ur: NEGATIVE mg/dL
Leukocytes, UA: NEGATIVE
Nitrite: NEGATIVE
PROTEIN: NEGATIVE mg/dL
Specific Gravity, Urine: 1.01 (ref 1.005–1.030)
pH: 5.5 (ref 5.0–8.0)

## 2015-11-22 LAB — POC OCCULT BLOOD, ED: FECAL OCCULT BLD: NEGATIVE

## 2015-11-22 LAB — GLUCOSE, CAPILLARY
GLUCOSE-CAPILLARY: 364 mg/dL — AB (ref 65–99)
Glucose-Capillary: 356 mg/dL — ABNORMAL HIGH (ref 65–99)

## 2015-11-22 MED ORDER — SODIUM CHLORIDE 0.9 % IV BOLUS (SEPSIS)
1000.0000 mL | Freq: Once | INTRAVENOUS | Status: AC
  Administered 2015-11-22: 1000 mL via INTRAVENOUS

## 2015-11-22 MED ORDER — PREGABALIN 25 MG PO CAPS
25.0000 mg | ORAL_CAPSULE | Freq: Every day | ORAL | Status: DC
  Administered 2015-11-22 – 2015-11-23 (×2): 25 mg via ORAL
  Filled 2015-11-22 (×2): qty 1

## 2015-11-22 MED ORDER — DEXAMETHASONE SODIUM PHOSPHATE 4 MG/ML IJ SOLN
2.0000 mg | Freq: Once | INTRAMUSCULAR | Status: AC
  Administered 2015-11-22: 2 mg via INTRAVENOUS
  Filled 2015-11-22: qty 1

## 2015-11-22 MED ORDER — OXYCODONE HCL 5 MG PO TABS
5.0000 mg | ORAL_TABLET | ORAL | Status: DC | PRN
Start: 2015-11-22 — End: 2015-11-22
  Administered 2015-11-22: 5 mg via ORAL
  Filled 2015-11-22: qty 1

## 2015-11-22 MED ORDER — CEPHALEXIN 250 MG PO CAPS
250.0000 mg | ORAL_CAPSULE | Freq: Four times a day (QID) | ORAL | Status: AC
  Administered 2015-11-22 (×3): 250 mg via ORAL
  Filled 2015-11-22 (×3): qty 1

## 2015-11-22 MED ORDER — POLYETHYLENE GLYCOL 3350 17 G PO PACK
17.0000 g | PACK | Freq: Every day | ORAL | Status: DC | PRN
  Administered 2015-11-22 – 2015-11-23 (×2): 17 g via ORAL
  Filled 2015-11-22 (×2): qty 1

## 2015-11-22 MED ORDER — OXYCODONE HCL 5 MG PO TABS
5.0000 mg | ORAL_TABLET | ORAL | Status: DC | PRN
  Administered 2015-11-22 – 2015-11-24 (×6): 5 mg via ORAL
  Filled 2015-11-22 (×6): qty 1

## 2015-11-22 MED ORDER — FENTANYL CITRATE (PF) 100 MCG/2ML IJ SOLN
100.0000 ug | INTRAMUSCULAR | Status: AC | PRN
  Administered 2015-11-22 (×3): 100 ug via INTRAVENOUS
  Filled 2015-11-22 (×3): qty 2

## 2015-11-22 MED ORDER — PROMETHAZINE HCL 25 MG/ML IJ SOLN
6.2500 mg | Freq: Once | INTRAMUSCULAR | Status: AC
  Administered 2015-11-22: 6.25 mg via INTRAVENOUS
  Filled 2015-11-22: qty 1

## 2015-11-22 MED ORDER — PROMETHAZINE HCL 25 MG PO TABS
12.5000 mg | ORAL_TABLET | Freq: Four times a day (QID) | ORAL | Status: DC | PRN
  Administered 2015-11-22 – 2015-11-24 (×2): 12.5 mg via ORAL
  Filled 2015-11-22 (×2): qty 1

## 2015-11-22 MED ORDER — SODIUM CHLORIDE 0.9 % IV BOLUS (SEPSIS)
500.0000 mL | Freq: Once | INTRAVENOUS | Status: AC
Start: 2015-11-22 — End: 2015-11-22
  Administered 2015-11-22: 500 mL via INTRAVENOUS

## 2015-11-22 MED ORDER — SODIUM CHLORIDE 0.9 % IV SOLN
INTRAVENOUS | Status: DC
  Administered 2015-11-22: 14:00:00 via INTRAVENOUS

## 2015-11-22 MED ORDER — LIDOCAINE VISCOUS 2 % MT SOLN
15.0000 mL | OROMUCOSAL | Status: DC | PRN
  Administered 2015-11-22 – 2015-11-23 (×4): 15 mL via OROMUCOSAL
  Filled 2015-11-22 (×6): qty 15

## 2015-11-22 MED ORDER — ONDANSETRON HCL 4 MG/2ML IJ SOLN
4.0000 mg | Freq: Once | INTRAMUSCULAR | Status: AC
  Administered 2015-11-22: 4 mg via INTRAVENOUS
  Filled 2015-11-22: qty 2

## 2015-11-22 MED ORDER — HYDRALAZINE HCL 10 MG PO TABS
10.0000 mg | ORAL_TABLET | Freq: Three times a day (TID) | ORAL | Status: DC
  Administered 2015-11-22 – 2015-11-24 (×6): 10 mg via ORAL
  Filled 2015-11-22 (×6): qty 1

## 2015-11-22 MED ORDER — LORAZEPAM 0.5 MG PO TABS
0.5000 mg | ORAL_TABLET | ORAL | Status: DC | PRN
  Administered 2015-11-23 (×2): 0.5 mg via ORAL
  Filled 2015-11-22 (×2): qty 1

## 2015-11-22 MED ORDER — KETOROLAC TROMETHAMINE 30 MG/ML IJ SOLN
30.0000 mg | Freq: Once | INTRAMUSCULAR | Status: AC
  Administered 2015-11-22: 30 mg via INTRAVENOUS
  Filled 2015-11-22: qty 1

## 2015-11-22 MED ORDER — SODIUM CHLORIDE 0.9 % IV SOLN
INTRAVENOUS | Status: DC
Start: 2015-11-22 — End: 2015-11-22
  Administered 2015-11-22: 11:00:00 via INTRAVENOUS

## 2015-11-22 MED ORDER — ASPIRIN EC 81 MG PO TBEC
81.0000 mg | DELAYED_RELEASE_TABLET | Freq: Every day | ORAL | Status: DC
  Administered 2015-11-22 – 2015-11-24 (×3): 81 mg via ORAL
  Filled 2015-11-22 (×3): qty 1

## 2015-11-22 MED ORDER — HEPARIN SODIUM (PORCINE) 5000 UNIT/ML IJ SOLN
5000.0000 [IU] | Freq: Three times a day (TID) | INTRAMUSCULAR | Status: DC
  Filled 2015-11-22 (×2): qty 1

## 2015-11-22 MED ORDER — HYDROMORPHONE HCL 1 MG/ML IJ SOLN
0.5000 mg | INTRAMUSCULAR | Status: DC | PRN
  Administered 2015-11-22 – 2015-11-24 (×6): 0.5 mg via INTRAVENOUS
  Filled 2015-11-22 (×6): qty 1

## 2015-11-22 MED ORDER — INSULIN GLARGINE 100 UNIT/ML ~~LOC~~ SOLN
15.0000 [IU] | Freq: Every day | SUBCUTANEOUS | Status: DC
  Administered 2015-11-22: 15 [IU] via SUBCUTANEOUS
  Filled 2015-11-22: qty 0.15

## 2015-11-22 MED ORDER — CARVEDILOL 25 MG PO TABS
25.0000 mg | ORAL_TABLET | Freq: Two times a day (BID) | ORAL | Status: DC
  Administered 2015-11-22 – 2015-11-24 (×4): 25 mg via ORAL
  Filled 2015-11-22 (×4): qty 1

## 2015-11-22 MED ORDER — FENTANYL CITRATE (PF) 100 MCG/2ML IJ SOLN
100.0000 ug | Freq: Once | INTRAMUSCULAR | Status: DC
  Filled 2015-11-22: qty 2

## 2015-11-22 MED ORDER — INSULIN GLARGINE 100 UNIT/ML ~~LOC~~ SOLN
22.0000 [IU] | Freq: Every day | SUBCUTANEOUS | Status: DC
  Administered 2015-11-23 – 2015-11-24 (×2): 22 [IU] via SUBCUTANEOUS
  Filled 2015-11-22 (×2): qty 0.22

## 2015-11-22 MED ORDER — SODIUM CHLORIDE 0.9 % IV SOLN
INTRAVENOUS | Status: DC
  Administered 2015-11-22 – 2015-11-23 (×3): via INTRAVENOUS

## 2015-11-22 NOTE — ED Notes (Signed)
Per Edison Nasuti pt refuses fentanyl stating it is what is making her sick; Eulis Foster made aware; new orders placed.

## 2015-11-22 NOTE — ED Notes (Addendum)
Patient c/o mouth pain, left ear pain, sore throat, and eye pain. Patient states this started last week while she was in the hospital at [facility] for cyclical vomitng. Patient is tearful in triage. Patient has not taken anything for any of her symptoms at home. Patient states "this is starting to make me feel sick."

## 2015-11-22 NOTE — ED Notes (Signed)
Pt c/o 10/10 pain on left side of throat, left mouth, left ear, and left eye that "just hurts" since Monday 11/21.  Pain is worse with swallowing and pt states that it feels like her nostrils and throat have "dry pockets" in them.  She has been trying to see a dentist but the one she is hoping to visit will not be in the office again until Monday. She is currently being treated for a UTI and has one day left of her antibiotics, name unknown.  Pt is tearful and seems anxious during assessment.

## 2015-11-22 NOTE — Progress Notes (Addendum)
Attempted to call ED to get report but placed on hold for about 8 minutes.  Will recall    11:55 AM report received from [facility].  Awaiting patient's arrival

## 2015-11-22 NOTE — ED Notes (Signed)
[facility] report given per Edison Nasuti.

## 2015-11-22 NOTE — H&P (Signed)
[facility] Admission History and Physical Service Pager: [phone]  Patient name: [name] [name] Medical record number: [id number] Date of birth: 17-Jul-1966 Age: 49 y.o. Gender: female  Primary Care Provider: Phill Myron, MD Consultants: ENT  Code Status: Full  Chief Complaint: Oral/throat pain w/ oral lesion  Assessment and Plan: NICLE CONNOLE is a 49 y.o. female presenting with oral/throat pain. PMH is significant for cyclic vomiting syndrome, marijuana abuse, major depression, HTN, DM, CKD III.  Oral/throat pain with hard palate lesion: Pain has been present since previous hospital discharge. Patient reports pain is most severe with swallowing and has kept her from PO intake since discharge. First noticed lesion one week ago. Pain incorporate's her left eye, ear, mouth, and throat. Ddx includes infection, neoplasm, foreign body, sinusitis, and trigeminal neuralgia. - Admit to family medicine teaching service; attending physician Dr. Mingo Amber - Consultation to ENT: Will see tomorrow; recommended initial dose of IV Decadron - CT maxillofacial without contrast: Ordered - Viscous lidocaine every 3 hours when necessary for pain - Dilaudid 0.5 mg IV every 4 hours when necessary - Continue home oxycodone 5 mg every 4 hours when necessary  AKI on CKD III: Cr 2.51. Baseline around 1.9. Likely secondary to volume depletion. - Holding home Lisinopril in the setting of AKI - Patient given dose of Toradol in [facility] ED >> watch for worsening Cr. - Avoid nephrotoxic medications - IV NS _0 /hr - Monitor with daily BMETs  Cyclic vomiting and abdominal pain: Previous admission was for this issue, patient currently not complaining of current symptoms. Long history of cyclic vomiting associated with marijuana use. Previous UDS was positive for THC. Only recent vomiting endorsed by patient was after Fentanyl was given in ED. NBNB. - ?Risk factor for mouth ulceration -  MIVFs: NS at 125 ml/hr - Ativan 0.29m po Q4hr prn and Phenergan 12.5 mg Q6hr prn for nausea/vomiting - Will monitor  Urinary tract infection: Diagnoses at previous admission - Still has 1 more day of Keflex to take to complete regimen - Keflex ordered w/ stopping order to complete abx regimen.  Anemia, unknown cause: Hgb 9.8 on admission. Recent baseline around 11. May be even lower than it appears due to hemoconcentration. FOBT negative in ED. Cause unknown at this time. Ddx includes kidney disease, chronic disease, acute blood loss (chronic unlikely due to recent labs).  - Will monitor  HTN: BPs currently 173/72 - Continue home meds: Carvedilol 256mbid, Hydralazine 1011mid - Holding home Lisinopril in the setting of AKI  T2DM with peripheral neuropathy: Last HgbA1c 9.6% (10/15/15) - Lantus 22 units daily >> would typically use reduced dose however I anticipate CBG elevations with recent Decadron administration - CBGs with meals and at bedtime - Cannot start SSI as patient has allergy to NovoLog - Continue home med: Lyrica 34m29mily for neuropathy  THC use/abuse: - Assuming THC is being smoked: could this increase risk of this ulceration's occurrence?   FEN/GI: Heart healthy/carb modified diet; normal saline at 134ml49mProphylaxis: Subcutaneous heparin  Disposition: Pending medical stability  History of Present Illness:  [name] [name] 49 y.56 female presenting with oral/mouth pain 2 weeks. Patient endorses pain which had initiated prior to her most recent hospital discharge. This pain is at its worst with swallowing. The pain/discomfort is located in her mouth, oropharynx, left ear, and left eye. Pain is described as sharp. She denies any trauma/injury to this area. Pain is caused patient to significantly decrease her  oral intake.  In the ED patient was hemodynamically stable. With significant complaints of pain. AKI was noted as well as anemia. ED provider recommended  biopsy of the lesion. We will consult ENT for ulcer management and admit patient for maintenance of AKI.  Review Of Systems: Per HPI with the following additions: Patient denies any fever, chills, vision changes, confusion, shortness of breath, chest pain, abdominal pain, diarrhea, dysuria. Patient endorsed occasional vomiting, nausea, and headache. Otherwise the remainder of the systems were negative.  Patient Active Problem List   Diagnosis Date Noted  . Oral lesion 11/22/2015  . Oral ulcer 11/22/2015  . Tooth ache   . Cystitis   . Vomiting 11/15/2015  . Cyclical vomiting syndrome 09/27/2015  . Gastroparesis 08/21/2015  . Hematuria 07/09/2015  . Blood in stool 06/23/2015  . Constipation 06/06/2015  . Onychomycosis 03/25/2015  . Encounter for chronic pain management 01/06/2015  . Vitamin D deficiency 11/28/2014  . At risk for polypharmacy 08/15/2014  . Other and unspecified hyperlipidemia 08/15/2014  . Leg cramping 07/18/2014  . Rotator cuff syndrome 04/19/2014  . Biceps tendinopathy of right upper extremity 04/05/2014  . Protein-calorie malnutrition, severe ([place]) 12/17/2013  . Anemia 11/15/2013  . CKD (chronic kidney disease) 08/17/2013  . Esophagitis 04/18/2013  . Preventative health care 09/18/2012  . High risk social situation 08/31/2012  . Allergic rhinitis, seasonal 05/04/2012  . Marijuana smoker ([place]) 11/03/2011  . Uncontrolled secondary diabetes mellitus with stage 3 CKD (GFR 30-59) ([place]) 10/23/2011  . Cyclic vomiting syndrome 08/03/2011  . Hot flashes 07/29/2011  . Backache 04/10/2007  . DEPRESSION, MAJOR, RECURRENT 02/23/2007  . Anxiety state 02/23/2007  . TOBACCO DEPENDENCE 02/23/2007  . MIGRAINE, UNSPEC., W/O INTRACTABLE MIGRAINE 02/23/2007  . Essential hypertension, malignant 02/23/2007  . INSOMNIA NOS 02/23/2007  . NEUROPATHY, DIABETIC 02/23/2007  . GERD 01/27/2004    Past Medical History: Past Medical History  Diagnosis Date  . PANCREATITIS 05/09/2008   . Cyclic vomiting syndrome   . Hypertension   . Complication of anesthesia     "problems waking up"  . Heart murmur   . Myocardial infarction [facility]) 07/2007    "they say I've had a silent one; I don't know"  . Pneumonia   . Anemia   . Blood transfusion   . GERD (gastroesophageal reflux disease)   . Anxiety   . Depression   . Smoker   . Marijuana smoker, continuous ([place])   . NEUROPATHY, DIABETIC 02/23/2007  . Type II diabetes mellitus ([place])   . Diabetic gastroparesis ([place])     /e-chart  . Chronic kidney disease (CKD), stage III (moderate)   . Nausea & vomiting 06/10/2015  . Intestinal impaction [facility])     Past Surgical History: Past Surgical History  Procedure Laterality Date  . Port-a-cath placement  ~ 2008    right chest; "poor access; frequent hospitalizations"  . Laparotomy and lysis of adhesions    . Abdominal hysterectomy  02/2002  . Cholecystectomy  1980's  . Cardiac catheterization    . Dilation and evacuation  X3  . Left hand surgery    . Cataract extraction w/ intraocular lens  implant, bilateral    . Colonoscopy    . Upper gi endoscopy    . Cesarean section      Social History: Social History  Substance Use Topics  . Smoking status: Current Some Day Smoker -- 0.50 packs/day for 22 years    Types: Cigarettes  . Smokeless tobacco: Never Used  . Alcohol  Use: No   Additional social history: None  Please also refer to relevant sections of EMR.  Family History: Family History  Problem Relation Age of Onset  . Diabetes type II Mother   . Diabetes Mother   . Hypertension Mother   . Diabetes type II Sister   . Diabetes Sister   . Hypertension Sister   . Hypertension Brother    Allergies and Medications: Allergies  Allergen Reactions  . Bee Venom Anaphylaxis  . Acetaminophen Nausea And Vomiting  . Novolog [Insulin Aspart] Other (See Comments)    Muscles in feet cramp  . Other     Sorbaview causes rash  . Tegaderm Ag Mesh [Silver]     Blisters  .  Erythromycin Nausea And Vomiting   Current Facility-Administered Medications on File Prior to Encounter  Medication Dose Route Frequency Provider Last Rate Last Dose  . 0.9 %  sodium chloride infusion   Intravenous Continuous Kinnie Feil, MD 20 mL/hr at 04/19/13 1500 500 mL at 04/19/13 1500   Current Outpatient Prescriptions on File Prior to Encounter  Medication Sig Dispense Refill  . blood glucose meter kit and supplies KIT Dispense based on patient and insurance preference. Use up to four times daily as directed. (FOR ICD-9 250.00, 250.01). 1 each 0  . carvedilol (COREG) 25 MG tablet Take 1 tablet (25 mg total) by mouth 2 (two) times daily with a meal. 30 tablet 5  . cephALEXin (KEFLEX) 250 MG capsule Take 1 capsule (250 mg total) by mouth 4 (four) times daily. 14 capsule 0  . insulin glargine (LANTUS) 100 UNIT/ML injection Inject 0.22 mLs (22 Units total) into the skin daily. 10 mL 2  . lisinopril (PRINIVIL,ZESTRIL) 40 MG tablet Take 1 tablet (40 mg total) by mouth daily. 30 tablet 2  . LORazepam (ATIVAN) 0.5 MG tablet Take 1 tablet (0.5 mg total) by mouth every 4 (four) hours as needed (nausea, vomiting). 20 tablet 0  . polyethylene glycol (MIRALAX / GLYCOLAX) packet Take 17 g by mouth daily as needed. May increase in 2-3 days to 2 times daily, Until stooling regularly 30 packet prn  . pregabalin (LYRICA) 25 MG capsule TAKE 1 TABLET BY MOUTH AT BEDTIME AS NEEDED 30 capsule 2  . aspirin EC 81 MG tablet Take 1 tablet (81 mg total) by mouth daily. (Patient not taking: Reported on 10/15/2015) 90 tablet 3  . cetirizine (ZYRTEC) 10 MG tablet Take 1 tablet (10 mg total) by mouth daily. (Patient not taking: Reported on 11/22/2015) 30 tablet 11  . hydrALAZINE (APRESOLINE) 10 MG tablet Take 1 tablet (10 mg total) by mouth 3 (three) times daily. (Patient not taking: Reported on 10/15/2015) 30 tablet 5  . oxyCODONE (ROXICODONE) 5 MG immediate release tablet Take 1 tablet (5 mg total) by mouth every  4 (four) hours as needed for severe pain. (Patient not taking: Reported on 11/22/2015) 5 tablet 0  . promethazine (PHENERGAN) 12.5 MG tablet Take 1 tablet (12.5 mg total) by mouth every 4 (four) hours as needed for nausea or vomiting. (Patient not taking: Reported on 11/22/2015) 30 tablet 5  . promethazine (PHENERGAN) 25 MG suppository Place 1 suppository (25 mg total) rectally every 6 (six) hours as needed for nausea or vomiting. (Patient not taking: Reported on 11/22/2015) 12 each 0    Objective: BP 173/72 mmHg  Pulse 56  Temp(Src) 98.1 F (36.7 C) (Oral)  Resp 19  Ht _0  (1.575 m)  Wt 138 lb (62.595 kg)  BMI  25.23 kg/m2  SpO2 100% Exam: General -- oriented x3, cooperative. HEENT -- Head is normocephalic. PERRLA. EOMI. Ears and nose benign. Grade II 0.5x1cm ulceration noted on left hard palate. Dentition poor. Dry MM. Neck -- supple; tenderness over the Lt mastoid process. No LAD Integument -- intact. No rash, erythema, or ecchymoses.  Chest -- good expansion. Lungs clear to auscultation. Cardiac -- RRR. No murmurs noted.  Abdomen -- soft, nontender. No masses palpable. Bowel sounds present. CNS -- cranial nerves II through XII grossly intact. No sensory deficits noted. Extremeties - no tenderness or effusions noted. ROM good. 5/5 bilateral strength. Dorsalis pedis pulses present and symmetrical.    Labs and Imaging: CBC BMET   Recent Labs Lab 11/22/15 0732  WBC 6.8  HGB 9.8*  HCT 28.5*  PLT 204    Recent Labs Lab 11/22/15 0732  NA 137  K 4.7  CL 105  CO2 25  BUN 37*  CREATININE 2.51*  GLUCOSE 305*  CALCIUM 9.3     FOBT: negative   Elberta Leatherwood, MD 11/22/2015, 8:00 PM PGY-2, [place] Intern pager: [phone], text pages welcome

## 2015-11-22 NOTE — Progress Notes (Signed)
New Admission Note:  Arrival Method: EMS Stretcher Mental Orientation: alert and Oriented X4  Telemetry: none Assessment: Completed Skin: no issues IV: left forearm Pain: severe Tubes:0 Safety Measures: Safety Fall Prevention Plan was given, discussed and signed. Admission: Completed [address] [place] Orientation: Patient has been orientated to the room, unit and the staff. Family: present on admission  Orders have been reviewed and implemented. Will continue to monitor the patient. Call light has been placed within reach and bed alarm has been activated.   Fabian Sharp, RN  Phone Number: [phone]

## 2015-11-22 NOTE — Progress Notes (Signed)
Paged Family medicine for further orders.  1:51 PM MD called back stating he will visit with the patient and place orders then

## 2015-11-22 NOTE — ED Provider Notes (Signed)
CSN: [id number]     Arrival date & time 11/22/15  0533 History   First MD Initiated Contact with Patient 11/22/15 [phone]     Chief Complaint  Patient presents with  . multiple complaints      (Consider location/radiation/quality/duration/timing/severity/associated sxs/prior Treatment) HPI  [name] [name] is a 49 y.o. female who presents for evaluation of left upper tooth pain, which has been present for 2 weeks, without trauma. She noticed the pain during a hospitalization recently, and at that time, it was evaluated with imaging with negative findings for acute process. Denies sinusitis, nasal discharge, facial pressure, loss of hearing or fever. The pain radiates to her left ear, left neck and has made it difficult to eat recently. She's taking her usual medications, without relief. There are no other known modifying factors.   Past Medical History  Diagnosis Date  . PANCREATITIS 05/09/2008  . Cyclic vomiting syndrome   . Hypertension   . Complication of anesthesia     "problems waking up"  . Heart murmur   . Myocardial infarction [facility]) 07/2007    "they say I've had a silent one; I don't know"  . Pneumonia   . Anemia   . Blood transfusion   . GERD (gastroesophageal reflux disease)   . Anxiety   . Depression   . Smoker   . Marijuana smoker, continuous ([place])   . NEUROPATHY, DIABETIC 02/23/2007  . Type II diabetes mellitus ([place])   . Diabetic gastroparesis ([place])     /e-chart  . Chronic kidney disease (CKD), stage III (moderate)   . Nausea & vomiting 06/10/2015  . Intestinal impaction [facility])    Past Surgical History  Procedure Laterality Date  . Port-a-cath placement  ~ 2008    right chest; "poor access; frequent hospitalizations"  . Laparotomy and lysis of adhesions    . Abdominal hysterectomy  02/2002  . Cholecystectomy  1980's  . Cardiac catheterization    . Dilation and evacuation  X3  . Left hand surgery    . Cataract extraction w/ intraocular lens  implant, bilateral      . Colonoscopy    . Upper gi endoscopy    . Cesarean section     Family History  Problem Relation Age of Onset  . Diabetes type II Mother   . Diabetes Mother   . Hypertension Mother   . Diabetes type II Sister   . Diabetes Sister   . Hypertension Sister   . Hypertension Brother    Social History  Substance Use Topics  . Smoking status: Current Some Day Smoker -- 0.50 packs/day for 22 years    Types: Cigarettes  . Smokeless tobacco: Never Used  . Alcohol Use: No   OB History    Gravida Para Term Preterm AB TAB SAB Ectopic Multiple Living   _0 Review of Systems  All other systems reviewed and are negative.     Allergies  Bee venom; Acetaminophen; Novolog; Other; Tegaderm ag mesh; and Erythromycin  Home Medications   Prior to Admission medications   Medication Sig Start Date End Date Taking? Authorizing Provider  blood glucose meter kit and supplies KIT Dispense based on patient and insurance preference. Use up to four times daily as directed. (FOR ICD-9 250.00, 250.01). 06/02/15  Yes Hilton Sinclair, MD  carvedilol (COREG) 25 MG tablet Take 1 tablet (25 mg total) by mouth 2 (two) times daily with a  meal. 09/29/15  Yes Alyssa A Haney, MD  cephALEXin (KEFLEX) 250 MG capsule Take 1 capsule (250 mg total) by mouth 4 (four) times daily. 11/17/15  Yes Elberta Leatherwood, MD  insulin glargine (LANTUS) 100 UNIT/ML injection Inject 0.22 mLs (22 Units total) into the skin daily. 06/02/15  Yes Hilton Sinclair, MD  lisinopril (PRINIVIL,ZESTRIL) 40 MG tablet Take 1 tablet (40 mg total) by mouth daily. 08/13/15  Yes Olam Idler, MD  LORazepam (ATIVAN) 0.5 MG tablet Take 1 tablet (0.5 mg total) by mouth every 4 (four) hours as needed (nausea, vomiting). 11/17/15  Yes Elberta Leatherwood, MD  polyethylene glycol (MIRALAX / GLYCOLAX) packet Take 17 g by mouth daily as needed. May increase in 2-3 days to 2 times daily, Until stooling regularly 06/02/15  Yes Hilton Sinclair, MD   pregabalin (LYRICA) 25 MG capsule TAKE 1 TABLET BY MOUTH AT BEDTIME AS NEEDED 08/21/15  Yes Olam Idler, MD  aspirin EC 81 MG tablet Take 1 tablet (81 mg total) by mouth daily. Patient not taking: Reported on 10/15/2015 06/02/15   Hilton Sinclair, MD  cetirizine (ZYRTEC) 10 MG tablet Take 1 tablet (10 mg total) by mouth daily. Patient not taking: Reported on 11/22/2015 06/20/15   Hilton Sinclair, MD  hydrALAZINE (APRESOLINE) 10 MG tablet Take 1 tablet (10 mg total) by mouth 3 (three) times daily. Patient not taking: Reported on 10/15/2015 09/29/15   Veatrice Bourbon, MD  oxyCODONE (ROXICODONE) 5 MG immediate release tablet Take 1 tablet (5 mg total) by mouth every 4 (four) hours as needed for severe pain. Patient not taking: Reported on 11/22/2015 11/04/15   Veatrice Bourbon, MD  promethazine (PHENERGAN) 12.5 MG tablet Take 1 tablet (12.5 mg total) by mouth every 4 (four) hours as needed for nausea or vomiting. Patient not taking: Reported on 11/22/2015 09/29/15   Veatrice Bourbon, MD  promethazine (PHENERGAN) 25 MG suppository Place 1 suppository (25 mg total) rectally every 6 (six) hours as needed for nausea or vomiting. Patient not taking: Reported on 11/22/2015 09/27/15   Tatyana Kirichenko, PA-C   BP 203/96 mmHg  Pulse 69  Temp(Src) 98.3 F (36.8 C) (Oral)  Resp 20  Ht 5' 1.5" (1.562 m)  Wt 138 lb (62.596 kg)  BMI 25.66 kg/m2  SpO2 100% Physical Exam  Constitutional: She is oriented to person, place, and time. She appears well-developed and well-nourished.  HENT:  Head: Normocephalic and atraumatic.  Right Ear: External ear normal.  Left Ear: External ear normal.  Lesion of the left posterior hard palate, with irregular ulceration of skin, and possible associated bony deformity beneath. See image below.  Eyes: Conjunctivae and EOM are normal. Pupils are equal, round, and reactive to light.  Neck: Normal range of motion and phonation normal. Neck supple. No tracheal deviation  present.  Mild tenderness left submandibular region without appreciable fullness, or palpable adenopathy.  Cardiovascular: Normal rate, regular rhythm and normal heart sounds.   Pulmonary/Chest: Effort normal and breath sounds normal. No stridor. She exhibits no bony tenderness.  Abdominal: Soft. There is no tenderness.  Musculoskeletal: Normal range of motion.  Neurological: She is alert and oriented to person, place, and time. No cranial nerve deficit or sensory deficit. She exhibits normal muscle tone. Coordination normal.  Skin: Skin is warm, dry and intact.  Psychiatric: She has a normal mood and affect. Her behavior is normal. Judgment and thought content normal.  Nursing note and vitals reviewed.   ED  Course  Procedures (including critical care time)   Medications  0.9 %  sodium chloride infusion ( Intravenous New Bag/Given 11/22/15 1041)  sodium chloride 0.9 % bolus 500 mL (0 mLs Intravenous Stopped 11/22/15 0803)  fentaNYL (SUBLIMAZE) injection 100 mcg (100 mcg Intravenous Given 11/22/15 1040)  sodium chloride 0.9 % bolus 1,000 mL (0 mLs Intravenous Stopped 11/22/15 1009)  ondansetron (ZOFRAN) injection 4 mg (4 mg Intravenous Given 11/22/15 1010)    Patient Vitals for the past 24 hrs:  BP Temp Temp src Pulse Resp SpO2 Height Weight  11/22/15 1040 (!) 203/96 mmHg - - 69 20 100 % - -  11/22/15 1037 (!) 203/96 mmHg - - 69 18 100 % - -  11/22/15 0949 189/70 mmHg - - - 20 100 % - -  11/22/15 0818 165/78 mmHg 98.3 F (36.8 C) Oral 67 22 100 % - -  11/22/15 0606 154/81 mmHg 98 F (36.7 C) Oral 67 20 100 % 5' 1.5" (1.562 m) 138 lb (62.596 kg)    10:38 AM Reevaluation with update and discussion. After initial assessment and treatment, an updated evaluation reveals patient is more comfortable at this time. Stool sample obtained by nursing, brown in color, Hemoccult negative. Findings discussed with patient, all questions answered. [place]   10:42 AM-Consult complete with  Dr. Alease Frame. Patient case explained and discussed. He agrees to admit patient for further evaluation and treatment. Call ended at 11:00     [place] (NOT AT [facility]) - Abnormal; Notable for the following:    Glucose, UA 500 (*)    All other components within normal limits  CBC WITH DIFFERENTIAL/PLATELET - Abnormal; Notable for the following:    RBC 3.13 (*)    Hemoglobin 9.8 (*)    HCT 28.5 (*)    All other components within normal limits  COMPREHENSIVE METABOLIC PANEL - Abnormal; Notable for the following:    Glucose, Bld 305 (*)    BUN 37 (*)    Creatinine, Ser 2.51 (*)    AST 12 (*)    GFR calc non Af Amer 21 (*)    GFR calc Af Amer 25 (*)    All other components within normal limits  POC OCCULT BLOOD, ED    Imaging Review No results found. I have personally reviewed and evaluated these images and lab results as part of my medical decision-making.   EKG Interpretation None      MDM   Final diagnoses:  Oral ulceration  AKI (acute kidney injury) ([place])  Anemia, unspecified anemia type    Oral pain secondary to lesion, which is nonspecified. Patient has significant pain preventing her from eating, which is likely causing her AKI. Incidental anemia, source, unknown. Doubt serous bacterial infection. Metabolic instability or impending vascular collapse.  Nursing Notes Reviewed/ Care Coordinated Applicable Imaging Reviewed Interpretation of Laboratory Data incorporated into ED treatment   Plan: Admit- Transfer to [facility]. (FPTS)  Daleen Bo, MD 11/22/15 1100

## 2015-11-22 NOTE — ED Notes (Signed)
[facility] called. 

## 2015-11-22 NOTE — ED Notes (Signed)
Attempted to reassess pain. Upon entering the room, pt was asleep. As staff approached bedside, pt woke up from snoring and reported pain unchanged.

## 2015-11-22 NOTE — ED Notes (Signed)
[facility] arrived for transport 

## 2015-11-23 DIAGNOSIS — K1370 Unspecified lesions of oral mucosa: Secondary | ICD-10-CM

## 2015-11-23 DIAGNOSIS — D649 Anemia, unspecified: Secondary | ICD-10-CM | POA: Insufficient documentation

## 2015-11-23 DIAGNOSIS — K121 Other forms of stomatitis: Secondary | ICD-10-CM | POA: Insufficient documentation

## 2015-11-23 DIAGNOSIS — N179 Acute kidney failure, unspecified: Secondary | ICD-10-CM | POA: Diagnosis not present

## 2015-11-23 DIAGNOSIS — K1230 Oral mucositis (ulcerative), unspecified: Secondary | ICD-10-CM | POA: Diagnosis not present

## 2015-11-23 LAB — CBC
HEMATOCRIT: 30.1 % — AB (ref 36.0–46.0)
Hemoglobin: 10.3 g/dL — ABNORMAL LOW (ref 12.0–15.0)
MCH: 31 pg (ref 26.0–34.0)
MCHC: 34.2 g/dL (ref 30.0–36.0)
MCV: 90.7 fL (ref 78.0–100.0)
Platelets: 203 10*3/uL (ref 150–400)
RBC: 3.32 MIL/uL — ABNORMAL LOW (ref 3.87–5.11)
RDW: 13.6 % (ref 11.5–15.5)
WBC: 5.4 10*3/uL (ref 4.0–10.5)

## 2015-11-23 LAB — BASIC METABOLIC PANEL
ANION GAP: 7 (ref 5–15)
Anion gap: 7 (ref 5–15)
BUN: 28 mg/dL — AB (ref 6–20)
BUN: 25 mg/dL — AB (ref 6–20)
CO2: 21 mmol/L — AB (ref 22–32)
CO2: 22 mmol/L (ref 22–32)
CREATININE: 1.85 mg/dL — AB (ref 0.44–1.00)
Calcium: 8.7 mg/dL — ABNORMAL LOW (ref 8.9–10.3)
Calcium: 8.9 mg/dL (ref 8.9–10.3)
Chloride: 105 mmol/L (ref 101–111)
Chloride: 99 mmol/L — ABNORMAL LOW (ref 101–111)
Creatinine, Ser: 2.09 mg/dL — ABNORMAL HIGH (ref 0.44–1.00)
GFR calc Af Amer: 31 mL/min — ABNORMAL LOW (ref >60)
GFR calc Af Amer: 36 mL/min — ABNORMAL LOW (ref >60)
GFR, EST NON AFRICAN AMERICAN: 26 mL/min — AB (ref >60)
GFR, EST NON AFRICAN AMERICAN: 31 mL/min — AB (ref >60)
GLUCOSE: 389 mg/dL — AB (ref 65–99)
Glucose, Bld: 446 mg/dL — ABNORMAL HIGH (ref 65–99)
POTASSIUM: 5.9 mmol/L — AB (ref 3.5–5.1)
Potassium: 5 mmol/L (ref 3.5–5.1)
SODIUM: 128 mmol/L — AB (ref 135–145)
Sodium: 133 mmol/L — ABNORMAL LOW (ref 135–145)

## 2015-11-23 LAB — GLUCOSE, CAPILLARY
GLUCOSE-CAPILLARY: 435 mg/dL — AB (ref 65–99)
Glucose-Capillary: 402 mg/dL — ABNORMAL HIGH (ref 65–99)
Glucose-Capillary: 358 mg/dL — ABNORMAL HIGH (ref 65–99)
Glucose-Capillary: 124 mg/dL — ABNORMAL HIGH (ref 65–99)

## 2015-11-23 MED ORDER — LIDOCAINE VISCOUS 2 % MT SOLN
15.0000 mL | OROMUCOSAL | Status: DC | PRN
  Administered 2015-11-23 – 2015-11-24 (×5): 15 mL via OROMUCOSAL
  Filled 2015-11-23 (×7): qty 15

## 2015-11-23 MED ORDER — MAGIC MOUTHWASH
2.0000 mL | Freq: Three times a day (TID) | ORAL | Status: DC | PRN
  Administered 2015-11-23: 2 mL via ORAL
  Filled 2015-11-23: qty 5

## 2015-11-23 MED ORDER — POLYETHYLENE GLYCOL 3350 17 G PO PACK
17.0000 g | PACK | Freq: Two times a day (BID) | ORAL | Status: DC
  Administered 2015-11-23 – 2015-11-24 (×2): 17 g via ORAL
  Filled 2015-11-23 (×2): qty 1

## 2015-11-23 MED ORDER — INSULIN ASPART 100 UNIT/ML ~~LOC~~ SOLN
0.0000 [IU] | Freq: Three times a day (TID) | SUBCUTANEOUS | Status: DC
  Administered 2015-11-23: 9 [IU] via SUBCUTANEOUS

## 2015-11-23 NOTE — Progress Notes (Signed)
Family Medicine Teaching Service Daily Progress Note Intern Pager: [phone]  Patient name: [name] [name] Medical record number: [id number] Date of birth: 05-20-1966 Age: 49 y.o. Gender: female  Primary Care Provider: Phill Myron, MD Consultants: ENT Code Status: Full  Assessment and Plan: 49 y.o. female presenting with oral/throat pain preventing oral intake and noted to have lesion of hard palate. PMH is significant for cyclic vomiting syndrome, marijuana abuse, major depression, HTN, DM, CKD III.  # Oral/throat pain with hard palate lesion:  Orthopantogram negative for abscess on 11/17/15. Ddx includes infection, neoplasm, foreign body, sinusitis, and trigeminal neuralgia. - ENT consulted, appreciate recommendations: IV Decadron - CT maxillofacial without contrast with no abnormalities - Magic Mouthwash TID as needed for mild pain - Continue home oxycodone 5 mg every 4 hours as needed for moderate pain - Dilaudid 0.5 mg IV every 4 hours as needed for severe pain. Discontinue when able.  # AKI on CKD III: Baseline around 1.9. Likely secondary to volume depletion. Note dose of Toradol given in ED so may see additional bump in creatinine. - Creatinine improved to 2.09 today - Holding home Lisinopril in the setting of AKI - Avoid nephrotoxic medications - KVO fluids due to significantly improved oral intake - Monitor with daily BMETs  # Hyperkalemia: - Potassium 5.9 today - Increase Insulin dosing as noted below, Miralax BID - EKG today. Cardiac monitoring until improved. - Monitor BMP  # Constipation: - Increase Miralax to BID dosing - Consider senna or enema if no improvement  # T2DM with peripheral neuropathy: Last HgbA1c 9.6% (10/15/15). CBG elevated due to recent Decadron - Increase Lantus from 15units to home dose of 22 units daily. Note allergy to Novolog. - CBGs with meals and at bedtime - Continue home med: Lyrica 25mg  daily for neuropathy  # Anemia, unknown cause:  Hgb 9.8 on admission. Baseline around 11.FOBT negative in ED.  - Hemoglobin improved to 10.3  - Monitor with CBC  # Cyclic vomiting and abdominal pain: Previous admission was for this issue, patient currently not complaining of current symptoms. Long history of cyclic vomiting associated with marijuana use. Previous UDS was positive for THC. . - Ativan 0.5mg  po Q4hr prn and Phenergan 12.5 mg Q6hr prn for nausea/vomiting - Will monitor  # Urinary tract infection: Diagnoses at previous admission - Will complete course of Keflex today  # HTN:  - Continue home meds: Carvedilol 25mg  bid, Hydralazine 10mg  tid - Holding home Lisinopril in the setting of AKI.   FEN/GI: Heart healthy/carb modified diet; normal saline at 175ml/hr Prophylaxis: Subcutaneous heparin  Disposition: Pending medical stability  Subjective:  Feeling better this morning. Notes improvement in pain and swelling with lidocaine and decadron. States she was able to eat her breakfast. Continues to note constipation, with last bowel movement four days ago. Feeling anxious about ulcer in mouth.  Objective: Temp:  [97.7 F (36.5 C)-98.3 F (36.8 C)] 97.7 F (36.5 C) (11/27 0521) Pulse Rate:  [56-69] 56 (11/27 0521) Resp:  [16-20] 18 (11/27 0521) BP: (152-203)/(67-96) 163/67 mmHg (11/27 0521) SpO2:  [98 %-100 %] 100 % (11/27 0521) Weight:  [138 lb (62.595 kg)] 138 lb (62.595 kg) (11/26 1338) Physical Exam: General: 49yo female resting comfortably in chair, anxious and tearful HEENT: ulceration noted on roof of mouth  Cardiovascular: S1 and S2 noted, no murmurs, regular rate and rhythm Respiratory: Clear to auscultation bilaterally, no increased work of breathing noted Abdomen: Soft and nondistended, nontender Extremities: No edema noted  Laboratory:  Recent Labs Lab 11/22/15 0732 11/23/15 0355  WBC 6.8 5.4  HGB 9.8* 10.3*  HCT 28.5* 30.1*  PLT 204 203    Recent Labs Lab 11/17/15 0910 11/22/15 0732  11/23/15 0355  NA 133* 137 133*  K 4.3 4.7 5.9*  CL 101 105 105  CO2 27 25 21*  BUN 24* 37* 28*  CREATININE 1.92* 2.51* 2.09*  CALCIUM 9.0 9.3 8.7*  PROT  --  6.5  --   BILITOT  --  0.4  --   ALKPHOS  --  68  --   ALT  --  15  --   AST  --  12*  --   GLUCOSE 226* 305* 389*   Imaging/Diagnostic Tests: Dg Orthopantogram  11/17/2015  CLINICAL DATA:  Two headache. Tenderness of the roof of the mouth on the left. Initial encounter. EXAM: ORTHOPANTOGRAM/PANORAMIC COMPARISON:  None. FINDINGS: The patient is missing multiple teeth in there appear to be cavities of the incisors. No periapical lucency is identified to suggest abscess. There is no fracture. The mandibular condyles are located. IMPRESSION: Negative for evidence of periapical abscess. Electronically Signed   By: Inge Rise M.D.   On: 11/17/2015 14:16   Ct Maxillofacial Wo Cm  11/23/2015  CLINICAL DATA:  49 year old female with recent ulcer on the roof of the mouth, which has progressively spread into the right eye/cheek region. EXAM: CT MAXILLOFACIAL WITHOUT CONTRAST TECHNIQUE: Multidetector CT imaging of the maxillofacial structures was performed. Multiplanar CT image reconstructions were also generated. A small metallic BB was placed on the right temple in order to reliably differentiate right from left. COMPARISON:  No priors. FINDINGS: No discrete soft tissue ulcer identified in the oral cavity on today's noncontrast CT examination. No focal soft tissue lesion in the right cheek or orbital region identified. Bilateral globes and retro-orbital soft tissues are grossly normal in appearance. No acute displaced facial bone fractures. Specifically, pterygoid plates are intact. The mandible is intact and mandibular condyles are located bilaterally. Poor dentition with multiple missing teeth. Visualized paranasal sinuses and mastoids are well pneumatized. Limited visualization of intracranial contents is unremarkable. IMPRESSION: 1. No  acute abnormality noted.  Incidental findings, as above. Electronically Signed   By: Vinnie Langton M.D.   On: 11/23/2015 00:00   Lorna Few, DO 11/23/2015, 8:45 AM PGY-2, [place] Intern pager: [phone], text pages welcome

## 2015-11-23 NOTE — Progress Notes (Signed)
Patient's blood sugar is 435.  Paged Resident MD just to inform them about her sugar

## 2015-11-23 NOTE — Consult Note (Signed)
Reason for Consult: Oral lesion, pain  HPI:  [name] [name] is an 49 y.o. female who presented to the ER yesterday c/o pain in her mouth for 1 week. She denies any recent trauma. She has a long history of recurrent vomiting and abdominal pain. The pain radiates to her left ear, left neck and has made it difficult to eat recently. She's taking her usual medications, without relief. There are no other known modifying factors. She also has a history of marijuana abuse, major depression, HTN, DM, CKD III. She has no previous history of ENT surgery.  Past Medical History  Diagnosis Date  . PANCREATITIS 05/09/2008  . Cyclic vomiting syndrome   . Hypertension   . Complication of anesthesia     "problems waking up"  . Heart murmur   . Myocardial infarction [facility]) 07/2007    "they say I've had a silent one; I don't know"  . Pneumonia   . Anemia   . Blood transfusion   . GERD (gastroesophageal reflux disease)   . Anxiety   . Depression   . Smoker   . Marijuana smoker, continuous ([place])   . NEUROPATHY, DIABETIC 02/23/2007  . Type II diabetes mellitus ([place])   . Diabetic gastroparesis ([place])     /e-chart  . Chronic kidney disease (CKD), stage III (moderate)   . Nausea & vomiting 06/10/2015  . Intestinal impaction [facility])     Past Surgical History  Procedure Laterality Date  . Port-a-cath placement  ~ 2008    right chest; "poor access; frequent hospitalizations"  . Laparotomy and lysis of adhesions    . Abdominal hysterectomy  02/2002  . Cholecystectomy  1980's  . Cardiac catheterization    . Dilation and evacuation  X3  . Left hand surgery    . Cataract extraction w/ intraocular lens  implant, bilateral    . Colonoscopy    . Upper gi endoscopy    . Cesarean section      Family History  Problem Relation Age of Onset  . Diabetes type II Mother   . Diabetes Mother   . Hypertension Mother   . Diabetes type II Sister   . Diabetes Sister   . Hypertension Sister   . Hypertension Brother      Social History:  reports that she has been smoking Cigarettes.  She has a 11 pack-year smoking history. She has never used smokeless tobacco. She reports that she uses illicit drugs (Marijuana). She reports that she does not drink alcohol.  Allergies:  Allergies  Allergen Reactions  . Bee Venom Anaphylaxis  . Acetaminophen Nausea And Vomiting  . Novolog [Insulin Aspart] Other (See Comments)    Muscles in feet cramp  . Other     Sorbaview causes rash  . Tegaderm Ag Mesh [Silver]     Blisters  . Erythromycin Nausea And Vomiting    Prior to Admission medications   Medication Sig Start Date End Date Taking? Authorizing Provider  blood glucose meter kit and supplies KIT Dispense based on patient and insurance preference. Use up to four times daily as directed. (FOR ICD-9 250.00, 250.01). 06/02/15  Yes Hilton Sinclair, MD  carvedilol (COREG) 25 MG tablet Take 1 tablet (25 mg total) by mouth 2 (two) times daily with a meal. 09/29/15  Yes Alyssa A Haney, MD  cephALEXin (KEFLEX) 250 MG capsule Take 1 capsule (250 mg total) by mouth 4 (four) times daily. 11/17/15  Yes Elberta Leatherwood, MD  insulin glargine (LANTUS)  100 UNIT/ML injection Inject 0.22 mLs (22 Units total) into the skin daily. 06/02/15  Yes Hilton Sinclair, MD  lisinopril (PRINIVIL,ZESTRIL) 40 MG tablet Take 1 tablet (40 mg total) by mouth daily. 08/13/15  Yes Olam Idler, MD  LORazepam (ATIVAN) 0.5 MG tablet Take 1 tablet (0.5 mg total) by mouth every 4 (four) hours as needed (nausea, vomiting). 11/17/15  Yes Elberta Leatherwood, MD  polyethylene glycol (MIRALAX / GLYCOLAX) packet Take 17 g by mouth daily as needed. May increase in 2-3 days to 2 times daily, Until stooling regularly 06/02/15  Yes Hilton Sinclair, MD  pregabalin (LYRICA) 25 MG capsule TAKE 1 TABLET BY MOUTH AT BEDTIME AS NEEDED 08/21/15  Yes Olam Idler, MD  aspirin EC 81 MG tablet Take 1 tablet (81 mg total) by mouth daily. Patient not taking: Reported on  10/15/2015 06/02/15   Hilton Sinclair, MD  cetirizine (ZYRTEC) 10 MG tablet Take 1 tablet (10 mg total) by mouth daily. Patient not taking: Reported on 11/22/2015 06/20/15   Hilton Sinclair, MD  hydrALAZINE (APRESOLINE) 10 MG tablet Take 1 tablet (10 mg total) by mouth 3 (three) times daily. Patient not taking: Reported on 10/15/2015 09/29/15   Veatrice Bourbon, MD  oxyCODONE (ROXICODONE) 5 MG immediate release tablet Take 1 tablet (5 mg total) by mouth every 4 (four) hours as needed for severe pain. Patient not taking: Reported on 11/22/2015 11/04/15   Veatrice Bourbon, MD  promethazine (PHENERGAN) 12.5 MG tablet Take 1 tablet (12.5 mg total) by mouth every 4 (four) hours as needed for nausea or vomiting. Patient not taking: Reported on 11/22/2015 09/29/15   Veatrice Bourbon, MD  promethazine (PHENERGAN) 25 MG suppository Place 1 suppository (25 mg total) rectally every 6 (six) hours as needed for nausea or vomiting. Patient not taking: Reported on 11/22/2015 09/27/15   Jeannett Senior, PA-C    Medications:  I have reviewed the patient's current medications. Scheduled: . aspirin EC  81 mg Oral Daily  . carvedilol  25 mg Oral BID WC  . heparin  5,000 Units Subcutaneous 3 times per day  . hydrALAZINE  10 mg Oral TID  . insulin glargine  22 Units Subcutaneous Daily  . polyethylene glycol  17 g Oral BID  . pregabalin  25 mg Oral QHS   FWY:OVZCHYIFOYDXA (DILAUDID) injection, LORazepam, magic mouthwash, oxyCODONE, promethazine  Results for orders placed or performed during the hospital encounter of 11/22/15 (from the past 48 hour(s))  CBC with Differential     Status: Abnormal   Collection Time: 11/22/15  7:32 AM  Result Value Ref Range   WBC 6.8 4.0 - 10.5 K/uL   RBC 3.13 (L) 3.87 - 5.11 MIL/uL   Hemoglobin 9.8 (L) 12.0 - 15.0 g/dL   HCT 28.5 (L) 36.0 - 46.0 %   MCV 91.1 78.0 - 100.0 fL   MCH 31.3 26.0 - 34.0 pg   MCHC 34.4 30.0 - 36.0 g/dL   RDW 13.5 11.5 - 15.5 %   Platelets 204  150 - 400 K/uL   Neutrophils Relative % 53 %   Neutro Abs 3.6 1.7 - 7.7 K/uL   Lymphocytes Relative 37 %   Lymphs Abs 2.5 0.7 - 4.0 K/uL   Monocytes Relative 8 %   Monocytes Absolute 0.6 0.1 - 1.0 K/uL   Eosinophils Relative 2 %   Eosinophils Absolute 0.1 0.0 - 0.7 K/uL   Basophils Relative 0 %   Basophils Absolute 0.0 0.0 -  0.1 K/uL  Comprehensive metabolic panel     Status: Abnormal   Collection Time: 11/22/15  7:32 AM  Result Value Ref Range   Sodium 137 135 - 145 mmol/L   Potassium 4.7 3.5 - 5.1 mmol/L   Chloride 105 101 - 111 mmol/L   CO2 25 22 - 32 mmol/L   Glucose, Bld 305 (H) 65 - 99 mg/dL   BUN 37 (H) 6 - 20 mg/dL   Creatinine, Ser 2.51 (H) 0.44 - 1.00 mg/dL   Calcium 9.3 8.9 - 10.3 mg/dL   Total Protein 6.5 6.5 - 8.1 g/dL   Albumin 3.6 3.5 - 5.0 g/dL   AST 12 (L) 15 - 41 U/L   ALT 15 14 - 54 U/L   Alkaline Phosphatase 68 38 - 126 U/L   Total Bilirubin 0.4 0.3 - 1.2 mg/dL   GFR calc non Af Amer 21 (L) >60 mL/min   GFR calc Af Amer 25 (L) >60 mL/min    Comment: (NOTE) The eGFR has been calculated using the CKD EPI equation. This calculation has not been validated in all clinical situations. eGFR's persistently <60 mL/min signify possible Chronic Kidney Disease.    Anion gap 7 5 - 15  Urinalysis, Routine w reflex microscopic     Status: Abnormal   Collection Time: 11/22/15  7:50 AM  Result Value Ref Range   Color, Urine YELLOW YELLOW   APPearance CLEAR CLEAR   Specific Gravity, Urine 1.010 1.005 - 1.030   pH 5.5 5.0 - 8.0   Glucose, UA 500 (A) NEGATIVE mg/dL   Hgb urine dipstick NEGATIVE NEGATIVE   Bilirubin Urine NEGATIVE NEGATIVE   Ketones, ur NEGATIVE NEGATIVE mg/dL   Protein, ur NEGATIVE NEGATIVE mg/dL   Nitrite NEGATIVE NEGATIVE   Leukocytes, UA NEGATIVE NEGATIVE    Comment: MICROSCOPIC NOT DONE ON URINES WITH NEGATIVE PROTEIN, BLOOD, LEUKOCYTES, NITRITE, OR GLUCOSE <1000 mg/dL.  POC occult blood, ED     Status: None   Collection Time: 11/22/15  9:09  AM  Result Value Ref Range   Fecal Occult Bld NEGATIVE NEGATIVE  Glucose, capillary     Status: Abnormal   Collection Time: 11/22/15  5:07 PM  Result Value Ref Range   Glucose-Capillary 356 (H) 65 - 99 mg/dL  Glucose, capillary     Status: Abnormal   Collection Time: 11/22/15  9:14 PM  Result Value Ref Range   Glucose-Capillary 364 (H) 65 - 99 mg/dL   Comment 1 Notify RN    Comment 2 Document in Chart   Basic metabolic panel     Status: Abnormal   Collection Time: 11/23/15  3:55 AM  Result Value Ref Range   Sodium 133 (L) 135 - 145 mmol/L   Potassium 5.9 (H) 3.5 - 5.1 mmol/L   Chloride 105 101 - 111 mmol/L   CO2 21 (L) 22 - 32 mmol/L   Glucose, Bld 389 (H) 65 - 99 mg/dL   BUN 28 (H) 6 - 20 mg/dL   Creatinine, Ser 2.09 (H) 0.44 - 1.00 mg/dL   Calcium 8.7 (L) 8.9 - 10.3 mg/dL   GFR calc non Af Amer 26 (L) >60 mL/min   GFR calc Af Amer 31 (L) >60 mL/min    Comment: (NOTE) The eGFR has been calculated using the CKD EPI equation. This calculation has not been validated in all clinical situations. eGFR's persistently <60 mL/min signify possible Chronic Kidney Disease.    Anion gap 7 5 - 15  CBC  Status: Abnormal   Collection Time: 11/23/15  3:55 AM  Result Value Ref Range   WBC 5.4 4.0 - 10.5 K/uL   RBC 3.32 (L) 3.87 - 5.11 MIL/uL   Hemoglobin 10.3 (L) 12.0 - 15.0 g/dL   HCT 30.1 (L) 36.0 - 46.0 %   MCV 90.7 78.0 - 100.0 fL   MCH 31.0 26.0 - 34.0 pg   MCHC 34.2 30.0 - 36.0 g/dL   RDW 13.6 11.5 - 15.5 %   Platelets 203 150 - 400 K/uL  Glucose, capillary     Status: Abnormal   Collection Time: 11/23/15  7:52 AM  Result Value Ref Range   Glucose-Capillary 402 (H) 65 - 99 mg/dL   *Note: Due to a large number of results and/or encounters for the requested time period, some results have not been displayed. A complete set of results can be found in Results Review.    Ct Maxillofacial Wo Cm  11/23/2015  CLINICAL DATA:  49 year old female with recent ulcer on the roof of  the mouth, which has progressively spread into the right eye/cheek region. EXAM: CT MAXILLOFACIAL WITHOUT CONTRAST TECHNIQUE: Multidetector CT imaging of the maxillofacial structures was performed. Multiplanar CT image reconstructions were also generated. A small metallic BB was placed on the right temple in order to reliably differentiate right from left. COMPARISON:  No priors. FINDINGS: No discrete soft tissue ulcer identified in the oral cavity on today's noncontrast CT examination. No focal soft tissue lesion in the right cheek or orbital region identified. Bilateral globes and retro-orbital soft tissues are grossly normal in appearance. No acute displaced facial bone fractures. Specifically, pterygoid plates are intact. The mandible is intact and mandibular condyles are located bilaterally. Poor dentition with multiple missing teeth. Visualized paranasal sinuses and mastoids are well pneumatized. Limited visualization of intracranial contents is unremarkable. IMPRESSION: 1. No acute abnormality noted.  Incidental findings, as above. Electronically Signed   By: Vinnie Langton M.D.   On: 11/23/2015 00:00   Review of Systems  All other systems reviewed and are negative.  Blood pressure 163/67, pulse 56, temperature 97.7 F (36.5 C), temperature source Oral, resp. rate 18, height _0  (1.575 m), weight 62.595 kg (138 lb), SpO2 100 %. Physical Exam  Constitutional: She is oriented to person, place, and time. She appears well-developed and well-nourished.  Head: Normocephalic and atraumatic.  Ears: Normal auricles and EACs. Nose: Normal septum, turbinates, and mucosa. Mouth: Ulceration of the left posterior hard palate. No palpable mass. Eyes: Conjunctivae and EOM are normal. Pupils are equal, round, and reactive to light.  Neck: Normal range of motion and phonation normal. Neck supple. No tracheal deviation present. No palpable mass. Musculoskeletal: Normal range of motion.  Neurological: She is  alert and oriented to person, place, and time. No cranial nerve deficit or sensory deficit. She exhibits normal muscle tone. Coordination normal.  Skin: Skin is warm, dry and intact.  Psychiatric: She has a normal mood and affect. Her behavior is normal. Judgment and thought content normal.  Nursing note and vitals reviewed.  Assessment/Plan: Oral mucositis/ulceration. No mass noted on exam and on CT scan. The cause is unclear, possibly related to her recurrent vomiting. Pt reports slight improvement in her pain after her steroid yesterday. Recommend a trial of prednisone dose pak for 12 days (or IV equivalent while in hospital). If the ulceration persists, will biopsy at that time. Pt may follow up with me as an outpatient after discharge.  Shaheer Bonfield,SUI W 11/23/2015, 11:24 AM

## 2015-11-23 NOTE — Progress Notes (Addendum)
Resident ordered Novolog for patient.  Patient stated that she has an allergy to Novolog.  Paged intern resident to clarify plan of care.  1:09 PM clarification.  Patient will be getting steroids for a short period of time.  We are to give Novolog to help control patient's blood sugar while she is on the steroids.  Patient just has an intolerance to the Novolog not necessarily an allergy per MD.

## 2015-11-24 DIAGNOSIS — K121 Other forms of stomatitis: Secondary | ICD-10-CM | POA: Diagnosis not present

## 2015-11-24 DIAGNOSIS — N179 Acute kidney failure, unspecified: Secondary | ICD-10-CM | POA: Diagnosis not present

## 2015-11-24 DIAGNOSIS — K1370 Unspecified lesions of oral mucosa: Secondary | ICD-10-CM | POA: Diagnosis not present

## 2015-11-24 LAB — GLUCOSE, CAPILLARY
GLUCOSE-CAPILLARY: 211 mg/dL — AB (ref 65–99)
Glucose-Capillary: 96 mg/dL (ref 65–99)
Glucose-Capillary: 88 mg/dL (ref 65–99)

## 2015-11-24 MED ORDER — HYDROMORPHONE HCL 2 MG PO TABS: 2.0000 mg | ORAL_TABLET | ORAL | Status: DC | PRN

## 2015-11-24 MED ORDER — PREDNISONE 10 MG PO TABS: ORAL_TABLET | ORAL | Status: DC

## 2015-11-24 MED ORDER — LISINOPRIL 40 MG PO TABS
40.0000 mg | ORAL_TABLET | Freq: Every day | ORAL | Status: DC
  Administered 2015-11-24: 40 mg via ORAL
  Filled 2015-11-24: qty 1

## 2015-11-24 MED ORDER — PREDNISONE 20 MG PO TABS: 20.0000 mg | ORAL_TABLET | Freq: Every day | ORAL | Status: DC

## 2015-11-24 MED ORDER — LIDOCAINE VISCOUS 2 % MT SOLN: 20.0000 mL | OROMUCOSAL | Status: DC | PRN

## 2015-11-24 MED ORDER — HYDROMORPHONE HCL 2 MG PO TABS
2.0000 mg | ORAL_TABLET | ORAL | Status: DC | PRN
  Administered 2015-11-24: 2 mg via ORAL
  Filled 2015-11-24: qty 1

## 2015-11-24 MED FILL — Hydromorphone HCl Inj 2 MG/ML: INTRAMUSCULAR | Qty: 1 | Status: AC

## 2015-11-24 NOTE — Progress Notes (Signed)
Family Medicine Teaching Service Daily Progress Note Intern Pager: [phone]  Patient name: [name] [name] Medical record number: [id number] Date of birth: Aug 31, 1966 Age: 49 y.o. Gender: female  Primary Care Provider: Phill Myron, MD Consultants: ENT Code Status: Full  Assessment and Plan: 49 y.o. female presenting with oral/throat pain preventing oral intake and noted to have lesion of hard palate. PMH is significant for cyclic vomiting syndrome, marijuana abuse, major depression, HTN, DM, CKD III.  # Oral/throat pain with hard palate lesion:  Orthopantogram negative for abscess on 11/17/15. Ddx includes infection, neoplasm, foreign body, sinusitis, and trigeminal neuralgia. CT maxillofacial in the ED showed no acute abnormality. - ENT consulted, appreciate recommendations: Recommend trial of prednisone for 12 days or IV equivalent while in the hospital. If ulceration persists, will get biopsy. Pt may need outpatient f/u with Dr. Benjamine Mola. - Viscous lidocaine mouthwash - Continue home oxycodone 5 mg every 4 hours as needed for moderate pain - Dilaudid 0.5 mg IV every 4 hours as needed for severe pain. Pt has required this x 2. Will d/c today if pain is improved.   # AKI on CKD III: Cr was 2.51 on admission > 1.85 yesterday. Baseline around 1.9. Likely secondary to volume depletion. Pt also received Toradol x 1 in the ED. - Will restart home Lisinopril today, as Cr has returned to baseline. - Avoid nephrotoxic medications - Monitor with daily BMETs  # Hyperkalemia: K 4.7 on admission > bumped to 5.9 on 11/27 > 5.0 yesterday. - Lantus was increased yesterday from 15 units to home dose of 22 units daily, as Pt was started on steroids. - Continue Miralax BID. Pt had 1 BM yesterday. - EKG ordered on 11/27 is pending. - Monitor BMP  # Constipation: Pt had 1 BM yesterday. - Increase Miralax to BID dosing  # T2DM with peripheral neuropathy: Last HgbA1c 9.6% (10/15/15). CBGs elevated yesterday  in the 300s-400s, likely secondary to Decadron. CBGs have improved to 124-211 since increasing Lantus dose. - Lantus was increased yesterday to 22 units, which is her home dose. - Sensitive SSI - CBGs with meals and at bedtime - Continue home med: Lyrica 25mg  daily for neuropathy  # Anemia, normocytic, unknown cause: Hgb 9.8 on admission> 10.3 yesterday. Baseline around 11. FOBT negative in ED.  - Monitor with daily CBCs  # Cyclic vomiting and abdominal pain: Previous admission was for this issue, patient currently not complaining of current symptoms. Long history of cyclic vomiting associated with marijuana use. Previous UDS was positive for THC. - Ativan 0.5mg  po Q4hr prn and Phenergan 12.5 mg Q6hr prn for nausea/vomiting - Will monitor  # HTN: BPs ranging from 131-165/58-80 in the last 24 hours. - Continue home meds: Carvedilol 25mg  bid, Hydralazine 10mg  tid - Have been holding home Lisinopril in the setting of AKI. Will restart today, as Cr has improved back to baseline.  FEN/GI: Heart healthy/carb modified diet; normal saline at 154ml/hr Prophylaxis: Subcutaneous heparin  Disposition: Pending medical stability  Subjective:  Pt is upset this morning because she feels that she was discharged too early last time she was here. She states that she would like to stay in the hospital this time until her pain is completely gone and her oral lesion has gone away. She also states that the only thing helping her pain is the Dilaudid.  Objective: Temp:  [97.6 F (36.4 C)-98.9 F (37.2 C)] 98.1 F (36.7 C) (11/28 0657) Pulse Rate:  [52-60] 52 (11/28 0657) Resp:  [12-19]  12 (11/28 0657) BP: (131-165)/(58-80) 145/77 mmHg (11/28 0657) SpO2:  [96 %-100 %] 100 % (11/28 0657) Physical Exam: General: 49yo female sitting on side of bed, appears angry HEENT: ulceration noted on left side roof of mouth  Cardiovascular: S1 and S2 noted, no murmurs, regular rate and rhythm Respiratory: Clear to  auscultation bilaterally, no increased work of breathing noted Abdomen: Soft and nondistended, nontender Extremities: No edema noted  Laboratory:  Recent Labs Lab 11/22/15 0732 11/23/15 0355  WBC 6.8 5.4  HGB 9.8* 10.3*  HCT 28.5* 30.1*  PLT 204 203    Recent Labs Lab 11/22/15 0732 11/23/15 0355 11/23/15 1137  NA 137 133* 128*  K 4.7 5.9* 5.0  CL 105 105 99*  CO2 25 21* 22  BUN 37* 28* 25*  CREATININE 2.51* 2.09* 1.85*  CALCIUM 9.3 8.7* 8.9  PROT 6.5  --   --   BILITOT 0.4  --   --   ALKPHOS 68  --   --   ALT 15  --   --   AST 12*  --   --   GLUCOSE 305* 389* 446*   Imaging/Diagnostic Tests: Dg Orthopantogram  11/17/2015  CLINICAL DATA:  Two headache. Tenderness of the roof of the mouth on the left. Initial encounter. EXAM: ORTHOPANTOGRAM/PANORAMIC COMPARISON:  None. FINDINGS: The patient is missing multiple teeth in there appear to be cavities of the incisors. No periapical lucency is identified to suggest abscess. There is no fracture. The mandibular condyles are located. IMPRESSION: Negative for evidence of periapical abscess. Electronically Signed   By: Inge Rise M.D.   On: 11/17/2015 14:16   Ct Maxillofacial Wo Cm  11/23/2015  CLINICAL DATA:  49 year old female with recent ulcer on the roof of the mouth, which has progressively spread into the right eye/cheek region. EXAM: CT MAXILLOFACIAL WITHOUT CONTRAST TECHNIQUE: Multidetector CT imaging of the maxillofacial structures was performed. Multiplanar CT image reconstructions were also generated. A small metallic BB was placed on the right temple in order to reliably differentiate right from left. COMPARISON:  No priors. FINDINGS: No discrete soft tissue ulcer identified in the oral cavity on today's noncontrast CT examination. No focal soft tissue lesion in the right cheek or orbital region identified. Bilateral globes and retro-orbital soft tissues are grossly normal in appearance. No acute displaced facial bone  fractures. Specifically, pterygoid plates are intact. The mandible is intact and mandibular condyles are located bilaterally. Poor dentition with multiple missing teeth. Visualized paranasal sinuses and mastoids are well pneumatized. Limited visualization of intracranial contents is unremarkable. IMPRESSION: 1. No acute abnormality noted.  Incidental findings, as above. Electronically Signed   By: Vinnie Langton M.D.   On: 11/23/2015 00:00   Sela Hua, MD 11/24/2015, 6:59 AM PGY-1, [place] Intern pager: [phone], text pages welcome

## 2015-11-24 NOTE — Care Management Note (Signed)
Case Management Note  Patient Details  Name: THU TENAGLIA MRN: [id number] Date of Birth: Aug 03, 1966  Subjective/Objective:      Patient is for dc today, no needs.              Action/Plan:   Expected Discharge Date:                  Expected Discharge Plan:  Home/Self Care  In-House Referral:     Discharge planning Services  CM Consult  Post Acute Care Choice:    Choice offered to:     DME Arranged:    DME Agency:     HH Arranged:    [place] Agency:     Status of Service:  Completed, signed off  Medicare Important Message Given:    Date Medicare IM Given:    Medicare IM give by:    Date Additional Medicare IM Given:    Additional Medicare Important Message give by:     If discussed at [place] of Stay Meetings, dates discussed:    Additional Comments:  Zenon Mayo, RN 11/24/2015, 2:17 PM

## 2015-11-24 NOTE — Discharge Summary (Signed)
[facility] Discharge Summary  Patient name: [name] [name] Medical record number: [id number] Date of birth: 08/27/1966 Age: 49 y.o. Gender: female Date of Admission: 11/22/2015  Date of Discharge: 11/24/15 Admitting Physician: Alveda Reasons, MD  Primary Care Provider: Phill Myron, MD Consultants: ENT  Indication for Hospitalization: Oral pain with hard palate lesion leading to decreased PO  Discharge Diagnoses/Problem List:  Hard palate lesion AKI on CKD III Cyclic vomiting syndrome Hyperkalemia Constipation T2DM with peripheral neuropathy Normocytic anemia HTN  Disposition: Home with ENT follow-up appointment  Discharge Condition: Stable  Discharge Exam:  General: 49yo female sitting on side of bed, appears angry HEENT: 1-2 cm ulceration noted on left side of hard palate Cardiovascular: S1 and S2 noted, no murmurs, regular rate and rhythm Respiratory: Clear to auscultation bilaterally, no increased work of breathing noted Abdomen: Soft and nondistended, nontender Extremities: No edema noted  Brief Hospital Course:  [name] [name] is a 49 year old female who presented to the ED with a lesion of her hard palate that has caused decreased PO intake. During her last hospitalization (11/19-11/21), she mentioned an oral lesion that was causing her pain, but the lesion was consistent with an aphthous ulcer. An orthopantogram was performed and was negative for an abscess.. After she went home, the ulcer continued to worsen. In the ED, CT maxillofacial was performed and showed no acute abnormalities. She was treated with viscous lidocaine q3hrs and was started on Dilaudid, as she stated she was in severe pain that was not relieved by morphine. ENT was consulted and thought the ulceration was potentially related to her recurrent vomiting. They recommended a prednisone taper and follow-up in their clinic. If the ulceration persists, they will consider  biopsy. On discharge, we gave her a few pills of Dilaudid 67m PO. She was able to take good PO and was discharged with follow-up in our clinic and with ENT.  Issues for Follow Up:  1. Pt was prescribed a Prednisone taper. Please make sure she has taken this and follow-up to see if her lesion is improving. The ED physician took a picture of the ulcer on admission, which can be used for comparison.  Significant Procedures: None  Significant Labs and Imaging:   Recent Labs Lab 11/22/15 0732 11/23/15 0355  WBC 6.8 5.4  HGB 9.8* 10.3*  HCT 28.5* 30.1*  PLT 204 203    Recent Labs Lab 11/22/15 0732 11/23/15 0355 11/23/15 1137  NA 137 133* 128*  K 4.7 5.9* 5.0  CL 105 105 99*  CO2 25 21* 22  GLUCOSE 305* 389* 446*  BUN 37* 28* 25*  CREATININE 2.51* 2.09* 1.85*  CALCIUM 9.3 8.7* 8.9  ALKPHOS 68  --   --   AST 12*  --   --   ALT 15  --   --   ALBUMIN 3.6  --   --    UA: negative ketones, negative leukocytes, negative nitrites FOBT negative  Results/Tests Pending at Time of Discharge: None  Discharge Medications:    Medication List    STOP taking these medications        cephALEXin 250 MG capsule  Commonly known as:  KEFLEX      TAKE these medications        aspirin EC 81 MG tablet  Take 1 tablet (81 mg total) by mouth daily.     blood glucose meter kit and supplies Kit  Dispense based on patient and insurance preference. Use up  to four times daily as directed. (FOR ICD-9 250.00, 250.01).     carvedilol 25 MG tablet  Commonly known as:  COREG  Take 1 tablet (25 mg total) by mouth 2 (two) times daily with a meal.     cetirizine 10 MG tablet  Commonly known as:  ZYRTEC  Take 1 tablet (10 mg total) by mouth daily.     hydrALAZINE 10 MG tablet  Commonly known as:  APRESOLINE  Take 1 tablet (10 mg total) by mouth 3 (three) times daily.     HYDROmorphone 2 MG tablet  Commonly known as:  DILAUDID  Take 1 tablet (2 mg total) by mouth every 4 (four) hours as  needed for severe pain.     insulin glargine 100 UNIT/ML injection  Commonly known as:  LANTUS  Inject 0.22 mLs (22 Units total) into the skin daily.     lidocaine 2 % solution  Commonly known as:  XYLOCAINE  Use as directed 20 mLs in the mouth or throat every 3 (three) hours as needed for mouth pain.     lisinopril 40 MG tablet  Commonly known as:  PRINIVIL,ZESTRIL  Take 1 tablet (40 mg total) by mouth daily.     LORazepam 0.5 MG tablet  Commonly known as:  ATIVAN  Take 1 tablet (0.5 mg total) by mouth every 4 (four) hours as needed (nausea, vomiting).     oxyCODONE 5 MG immediate release tablet  Commonly known as:  ROXICODONE  Take 1 tablet (5 mg total) by mouth every 4 (four) hours as needed for severe pain.     polyethylene glycol packet  Commonly known as:  MIRALAX / GLYCOLAX  Take 17 g by mouth daily as needed. May increase in 2-3 days to 2 times daily, Until stooling regularly     predniSONE 10 MG tablet  Commonly known as:  DELTASONE  Take 2 pills (82m) daily for 5 days, then 1 pill (132m daily for 5 days, then 1/2 pill (30m73mdaily for 5 days.     pregabalin 25 MG capsule  Commonly known as:  LYRICA  TAKE 1 TABLET BY MOUTH AT BEDTIME AS NEEDED     promethazine 12.5 MG tablet  Commonly known as:  PHENERGAN  Take 1 tablet (12.5 mg total) by mouth every 4 (four) hours as needed for nausea or vomiting.        Discharge Instructions: Please refer to Patient Instructions section of EMR for full details.  Patient was counseled important signs and symptoms that should prompt return to medical care, changes in medications, dietary instructions, activity restrictions, and follow up appointments.   Follow-Up Appointments: Follow-up Information    Follow up with AndTawanna SatD On 11/27/2015.   Specialty:  Family Medicine   Why:  '@11' :15am for Hospital Follow Up   Contact information:   [address] [phone]    Follow up with TEOAscencion DikeD On 12/02/2015.   Specialty:  Otolaryngology   Why:  Follow-up appointment at 1:30pm.   Contact information:   [address] [place] [phone]       [name] [name] 11/25/2015, 1:54 PM PGY-1, [place] [facility]

## 2015-11-24 NOTE — Discharge Instructions (Signed)
You were hospitalized for an oral lesion making it difficult for you to eat and drink. ENT evaluated your lesion and recommended oral steroids and following up with them in twelve days for re-evaluation. Please follow up with your family medicine doctor as scheduled.  Instructions for taking the steroid: Take 2 pills (20mg ) daily for 5 days, then take 1 pill (10mg ) daily for 5 days, then take 1/2 pill (5mg ) daily for 5 days.

## 2015-11-24 NOTE — Progress Notes (Signed)
   11/24/15 1300  Clinical Encounter Type  Visited With Patient;Health care provider  Visit Type Initial;Psychological support;Social support  Referral From Care management  Consult/Referral To Faith community  Spiritual Encounters  Spiritual Needs Emotional  Stress Factors  Patient Stress Factors Health changes   Chaplain visited with Pt. Pt. was upset and frustration with her care at [facility]. Chaplain was able to get some answers for the Pt, and the Pt. stated that she felt more at ease. Chaplain is available as needed

## 2015-11-24 NOTE — Progress Notes (Signed)
Pharmacist Provided - Patient Medication Education Prior to Discharge   [name] [name] is an 49 y.o. female who presented to [facility] on 11/22/2015 with a chief complaint of throat pain. Chief Complaint  Patient presents with  . multiple complaints      [x]  Patient will be discharged with 3 new medications []  Patient being discharged without any new medications  The following medications were discussed with the patient: hydromorphone, prednisone, insulin, viscous lidocaine, laxatives  Pain Control medications: [x]  Yes    []  No  Diabetes Medications: [x]  Yes    []  No  Heart Failure Medications: []  Yes    [x]  No  Anticoagulation Medications:  []  Yes    [x]  No  Antibiotics at discharge: []  Yes    [x]  No  Allergy Assessment Completed and Updated: [x]  Yes    []  No Identified Patient Allergies:  Allergies  Allergen Reactions  . Bee Venom Anaphylaxis  . Acetaminophen Nausea And Vomiting  . Novolog [Insulin Aspart] Other (See Comments)    Muscles in feet cramp  . Other     Sorbaview causes rash  . Tegaderm Ag Mesh [Silver]     Blisters  . Erythromycin Nausea And Vomiting     Medication Adherence Assessment: []  Excellent (no doses missed/week)      []  Good (1 dose missed/week)      [x]  Partial (2-3 doses missed/week)      []  Poor (>3 doses missed/week)  Barriers to Obtaining Medications: []  Yes [x]  No  [name] [name] expressed no concern with obtaining medication.  Assessment: Discussed proper administration and potential adverse effects of hydromorphone, prednisone, and viscous lidocaine. Warned patient of potential respiratory depression and hypotension associated with hydromorphone and strongly encouraged strict adherence to prescribed regimen. Also informed patient of OTC laxative options as she is being discharged with opioid medications. Additionally, informed patient that she would be likely to see higher BG readings while on prednisone. Instructed to watch closely and  follow up with PCP if noticing very high readings.  Time spent preparing for discharge counseling: 15 min Time spent counseling patient: 20 min  [name] [name], PharmD 11/24/2015, 2:29 PM

## 2015-11-27 ENCOUNTER — Inpatient Hospital Stay: Payer: Self-pay | Admitting: Family Medicine

## 2015-11-28 ENCOUNTER — Ambulatory Visit (INDEPENDENT_AMBULATORY_CARE_PROVIDER_SITE_OTHER): Payer: Medicare Other | Admitting: Family Medicine

## 2015-11-28 ENCOUNTER — Encounter: Payer: Self-pay | Admitting: Family Medicine

## 2015-11-28 VITALS — BP 132/66 | HR 54 | Temp 97.9°F | Ht 61.5 in | Wt 149.0 lb

## 2015-11-28 DIAGNOSIS — E1365 Other specified diabetes mellitus with hyperglycemia: Secondary | ICD-10-CM

## 2015-11-28 DIAGNOSIS — E875 Hyperkalemia: Secondary | ICD-10-CM | POA: Diagnosis not present

## 2015-11-28 DIAGNOSIS — N183 Chronic kidney disease, stage 3 (moderate): Secondary | ICD-10-CM

## 2015-11-28 DIAGNOSIS — IMO0002 Reserved for concepts with insufficient information to code with codable children: Secondary | ICD-10-CM

## 2015-11-28 DIAGNOSIS — K121 Other forms of stomatitis: Secondary | ICD-10-CM | POA: Diagnosis not present

## 2015-11-28 DIAGNOSIS — E7800 Pure hypercholesterolemia, unspecified: Secondary | ICD-10-CM | POA: Insufficient documentation

## 2015-11-28 DIAGNOSIS — E118 Type 2 diabetes mellitus with unspecified complications: Secondary | ICD-10-CM

## 2015-11-28 DIAGNOSIS — E1322 Other specified diabetes mellitus with diabetic chronic kidney disease: Secondary | ICD-10-CM

## 2015-11-28 HISTORY — DX: Pure hypercholesterolemia, unspecified: E78.00

## 2015-11-28 LAB — GLUCOSE, CAPILLARY: GLUCOSE-CAPILLARY: 155 mg/dL — AB (ref 65–99)

## 2015-11-28 NOTE — Assessment & Plan Note (Signed)
I reviewed old labs. Her FLP were normal earlier this year. Unfortunately she did not come with her most recent lab report. I suggested she return with report to her PCP for counseling and management. She agreed with plan.

## 2015-11-28 NOTE — Assessment & Plan Note (Signed)
Her potassium was slightly elevated on admission and repeat prior to discharge was normal. Monitor for now. I recommended f/u with PCP in 1-2 wks for recheck. Return precaution discussed.

## 2015-11-28 NOTE — Assessment & Plan Note (Addendum)
CBG looks good despite being on Prednisone. No changes made to her DM regimen.

## 2015-11-28 NOTE — Assessment & Plan Note (Signed)
Ulcer non-healing after 2 wks. Picture on chart. As discussed with patient she will benefit from biopsy. She stated she has this planned with her ENT and her appointment is in 4 days. She is advised to complete her steroid and continue pain control as instructed.\ F/U as needed.

## 2015-11-28 NOTE — Progress Notes (Signed)
Subjective:     Patient ID: [name] [name], female   DOB: July 03, 1966, 49 y.o.   MRN: [id number]  HPI Palate Sore: Patient here to follow up after hospital discharge for hard palate ulcer which has been there for more than 2 wks. She stated it started after she was given Rocephin during her previous hospitalization and since then no improvement. She was seen by an ENT at the hospital who recommended d/c home on prednisone and out patient ENT follow- up next Tuesday in 4 days. No fever. She continues to endorse pain now radiating to her left ear, however her appetite is a bit better now. She uses Lidocaine mouth wash and pain med which helps a lot. Still pain in the back on her throat. DM2: She is on Lantus 22 unit qd. She was advised to follow up for glucose check since she is on steroid. Her home CBG ranges from  160-158, none over 200. She denies any other concern. Hyperkalemia: Had slightly elevated K+ during hospitalization which normalized prior to discharge. Here for follow up. HLD: She stated [place] [facility] care came to her house recently and did a cholesterol check. She was informed she has high cholesterol and that she needed to be on Statin. She did not bring her lab report today.  Current Outpatient Prescriptions on File Prior to Visit  Medication Sig Dispense Refill  . aspirin EC 81 MG tablet Take 1 tablet (81 mg total) by mouth daily. (Patient not taking: Reported on 10/15/2015) 90 tablet 3  . blood glucose meter kit and supplies KIT Dispense based on patient and insurance preference. Use up to four times daily as directed. (FOR ICD-9 250.00, 250.01). 1 each 0  . carvedilol (COREG) 25 MG tablet Take 1 tablet (25 mg total) by mouth 2 (two) times daily with a meal. 30 tablet 5  . cetirizine (ZYRTEC) 10 MG tablet Take 1 tablet (10 mg total) by mouth daily. (Patient not taking: Reported on 11/22/2015) 30 tablet 11  . hydrALAZINE (APRESOLINE) 10 MG tablet Take 1 tablet (10 mg total) by mouth 3  (three) times daily. (Patient not taking: Reported on 10/15/2015) 30 tablet 5  . HYDROmorphone (DILAUDID) 2 MG tablet Take 1 tablet (2 mg total) by mouth every 4 (four) hours as needed for severe pain. 50 tablet 0  . insulin glargine (LANTUS) 100 UNIT/ML injection Inject 0.22 mLs (22 Units total) into the skin daily. 10 mL 2  . lidocaine (XYLOCAINE) 2 % solution Use as directed 20 mLs in the mouth or throat every 3 (three) hours as needed for mouth pain. 100 mL 3  . lisinopril (PRINIVIL,ZESTRIL) 40 MG tablet Take 1 tablet (40 mg total) by mouth daily. 30 tablet 2  . LORazepam (ATIVAN) 0.5 MG tablet Take 1 tablet (0.5 mg total) by mouth every 4 (four) hours as needed (nausea, vomiting). 20 tablet 0  . oxyCODONE (ROXICODONE) 5 MG immediate release tablet Take 1 tablet (5 mg total) by mouth every 4 (four) hours as needed for severe pain. (Patient not taking: Reported on 11/22/2015) 5 tablet 0  . polyethylene glycol (MIRALAX / GLYCOLAX) packet Take 17 g by mouth daily as needed. May increase in 2-3 days to 2 times daily, Until stooling regularly 30 packet prn  . predniSONE (DELTASONE) 10 MG tablet Take 2 pills (41m) daily for 5 days, then 1 pill (179m daily for 5 days, then 1/2 pill (56m60mdaily for 5 days. 18 tablet 0  . pregabalin (LYRICA) 25 MG  capsule TAKE 1 TABLET BY MOUTH AT BEDTIME AS NEEDED 30 capsule 2  . promethazine (PHENERGAN) 12.5 MG tablet Take 1 tablet (12.5 mg total) by mouth every 4 (four) hours as needed for nausea or vomiting. (Patient not taking: Reported on 11/22/2015) 30 tablet 5   Current Facility-Administered Medications on File Prior to Visit  Medication Dose Route Frequency Provider Last Rate Last Dose  . 0.9 %  sodium chloride infusion   Intravenous Continuous Kinnie Feil, MD 20 mL/hr at 04/19/13 1500 500 mL at 04/19/13 1500   Past Medical History  Diagnosis Date  . PANCREATITIS 05/09/2008  . Cyclic vomiting syndrome   . Hypertension   . Complication of anesthesia      "problems waking up"  . Heart murmur   . Myocardial infarction [facility]) 07/2007    "they say I've had a silent one; I don't know"  . Pneumonia   . Anemia   . Blood transfusion   . GERD (gastroesophageal reflux disease)   . Anxiety   . Depression   . Smoker   . Marijuana smoker, continuous ([place])   . NEUROPATHY, DIABETIC 02/23/2007  . Type II diabetes mellitus ([place])   . Diabetic gastroparesis ([place])     /e-chart  . Chronic kidney disease (CKD), stage III (moderate)   . Nausea & vomiting 06/10/2015  . Intestinal impaction ([facility])      Review of Systems  Constitutional: Negative for fever.  HENT: Positive for ear pain and mouth sores. Negative for dental problem and ear discharge.   Respiratory: Negative.   Cardiovascular: Negative.   Gastrointestinal: Negative.   Genitourinary: Negative.   All other systems reviewed and are negative.      Filed Vitals:   11/28/15 1034  BP: 132/66  Pulse: 54  Temp: 97.9 F (36.6 C)  TempSrc: Oral  Height: 5' 1.5" (1.562 m)  Weight: 149 lb (67.586 kg)    Objective:   Physical Exam  Constitutional: She appears well-developed. No distress.  HENT:  Right Ear: Tympanic membrane, external ear and ear canal normal. No drainage.  Left Ear: Tympanic membrane and ear canal normal. There is tenderness. No drainage.  Mouth/Throat:    Cardiovascular: Normal rate, regular rhythm, normal heart sounds and intact distal pulses.   No murmur heard. Pulmonary/Chest: Effort normal and breath sounds normal. No respiratory distress. She has no wheezes.  Abdominal: Soft. Bowel sounds are normal. She exhibits no distension. There is no tenderness.  Nursing note and vitals reviewed.        Assessment:     Palate ulcer DM2 Hyperkalemia Elevated cholesterol     Plan:     Check problem list.

## 2015-11-28 NOTE — Patient Instructions (Signed)
It was nice seeing you today. I am sorry about your mouth soreness. I agree with follow up with ENT for possible biopsy. Continue your steroid medication and also your current regimen for diabetes. Please see Dr Berkley Harvey back in the next 2-3 wks to recheck your potassium. I hope you enjoy the rest of your Holiday.  Oral Ulcers Oral ulcers are painful, shallow sores around the lining of the mouth. They can affect the gums, the inside of the lips, and the cheeks. (Sores on the outside of the lips and on the face are different.) They typically first occur in school-aged children and teenagers. Oral ulcers may also be called canker sores or cold sores. CAUSES  Canker sores and cold sores can be caused by many factors including:  Infection.  Injury.  Sun exposure.  Medications.  Emotional stress.  Food allergies.  Vitamin deficiencies.  Toothpastes containing sodium lauryl sulfate. The herpes virus can be the cause of mouth ulcers. The first infection can be severe and cause 10 or more ulcers on the gums, tongue, and lips with fever and difficulty in swallowing. This infection usually occurs between the ages of 47 and 3 years.  SYMPTOMS  The typical sore is about  inch (6 mm) in size and is an oval or round ulcer with red borders. DIAGNOSIS  Your caregiver can diagnose simple oral ulcers by examination. Additional testing is usually not required.  TREATMENT  Treatment is aimed at pain relief. Generally, oral ulcers resolve by themselves within 1 to 2 weeks without medication and are not contagious unless caused by herpes (and other viruses). Antibiotics are not effective with mouth sores. Avoid direct contact with others until the ulcer is completely healed. See your caregiver for follow-up care as recommended. Also:  Offer a soft diet.  Encourage plenty of fluids to prevent dehydration. Popsicles and milk shakes can be helpful.  Avoid acidic and salty foods and drinks such as orange  juice.  Infants and young children will often refuse to drink because of pain. Using a teaspoon, cup, or syringe to give small amounts of fluids frequently can help prevent dehydration.  Cold compresses on the face may help reduce pain.  Pain medication can help control soreness.  A solution of diphenhydramine mixed with a liquid antacid can be useful to decrease the soreness of ulcers. Consult a caregiver for the dosing.  Liquids or ointments with a numbing ingredient may be helpful when used as recommended.  Older children and teenagers can rinse their mouth with a salt-water mixture (1/2 teaspoon of salt in 8 ounces of water) four times a day. This treatment is uncomfortable but may reduce the time the ulcers are present.  There are many over-the-counter throat lozenges and medications available for oral ulcers. Their effectiveness has not been studied.  Consult your medical caregiver prior to using homeopathic treatments for oral ulcers. SEEK MEDICAL CARE IF:   You think your child needs to be seen.  The pain worsens and you cannot control it.  There are 4 or more ulcers.  The lips and gums begin to bleed and crust.  A single mouth ulcer is near a tooth that is causing a toothache or pain.  Your child has a fever, swollen face, or swollen glands.  The ulcers began after starting a medication.  Mouth ulcers keep reoccurring or last more than 2 weeks.  You think your child is not taking adequate fluids. SEEK IMMEDIATE MEDICAL CARE IF:   Your child has  a high fever.  Your child is unable to swallow or becomes dehydrated.  Your child looks or acts very ill.  An ulcer caused by a chemical your child accidentally put in their mouth.   This information is not intended to replace advice given to you by your health care provider. Make sure you discuss any questions you have with your health care provider.   Document Released: 01/20/2005 Document Revised: 01/03/2015 Document  Reviewed: 04/30/2015 Elsevier Interactive Patient Education [employer].

## 2015-12-01 ENCOUNTER — Other Ambulatory Visit: Payer: Self-pay | Admitting: *Deleted

## 2015-12-01 NOTE — Telephone Encounter (Signed)
Patient verified DOB Patient was seen on 11/28/15 and neglected to request a refill on Ativan. Please fill and follow-up with the patient.

## 2015-12-02 DIAGNOSIS — K1230 Oral mucositis (ulcerative), unspecified: Secondary | ICD-10-CM | POA: Diagnosis not present

## 2015-12-02 NOTE — Telephone Encounter (Signed)
Will check with MD that patient saw last week but she will likely need to see her pcp for this refill as previously instructed.  Kaetlyn Noa,CMA

## 2015-12-02 NOTE — Telephone Encounter (Addendum)
Please send Ativan refill to her PCP. She shouldn't need to come back to see PCP or other provider for Ativan refill if her PCP has been filling this previously unless he wants her to stop the medication. Now this is a conversation she should have with her PCP.Thanks.

## 2015-12-02 NOTE — Telephone Encounter (Signed)
Nope. Sorry, i've already double booked for other patients. See can make a sameday apt if she needs.

## 2015-12-02 NOTE — Telephone Encounter (Signed)
Pt was here last Friday to see dr Gwendlyn Deutscher. She forgot to request a refill on the ativan. She is requesting a refill.

## 2015-12-02 NOTE — Telephone Encounter (Signed)
LM for patient to call back.  Please assist her in making an appt to discuss refilling her ativan.  Her pcp is full til the new year but Dr. Lamar Benes has agreed to see her and refill medication based on pcp's opinion.  Thanks [employer]

## 2015-12-04 ENCOUNTER — Encounter: Payer: Self-pay | Admitting: Family Medicine

## 2015-12-04 ENCOUNTER — Ambulatory Visit (INDEPENDENT_AMBULATORY_CARE_PROVIDER_SITE_OTHER): Payer: Medicare Other | Admitting: Family Medicine

## 2015-12-04 ENCOUNTER — Telehealth: Payer: Self-pay | Admitting: Family Medicine

## 2015-12-04 VITALS — BP 174/63 | HR 63 | Temp 98.0°F | Ht 61.5 in | Wt 145.6 lb

## 2015-12-04 DIAGNOSIS — G43A1 Cyclical vomiting, intractable: Secondary | ICD-10-CM

## 2015-12-04 DIAGNOSIS — N3090 Cystitis, unspecified without hematuria: Secondary | ICD-10-CM

## 2015-12-04 DIAGNOSIS — R112 Nausea with vomiting, unspecified: Secondary | ICD-10-CM | POA: Diagnosis not present

## 2015-12-04 DIAGNOSIS — N183 Chronic kidney disease, stage 3 unspecified: Secondary | ICD-10-CM

## 2015-12-04 DIAGNOSIS — R1115 Cyclical vomiting syndrome unrelated to migraine: Secondary | ICD-10-CM

## 2015-12-04 DIAGNOSIS — R300 Dysuria: Secondary | ICD-10-CM

## 2015-12-04 DIAGNOSIS — K121 Other forms of stomatitis: Secondary | ICD-10-CM

## 2015-12-04 LAB — BASIC METABOLIC PANEL WITH GFR
BUN: 22 mg/dL (ref 7–25)
CALCIUM: 9.3 mg/dL (ref 8.6–10.4)
CHLORIDE: 103 mmol/L (ref 98–110)
CO2: 26 mmol/L (ref 20–31)
CREATININE: 1.7 mg/dL — AB (ref 0.50–1.05)
GFR, Est African American: 40 mL/min — ABNORMAL LOW (ref >=60)
GFR, Est Non African American: 35 mL/min — ABNORMAL LOW (ref >=60)
Glucose, Bld: 315 mg/dL — ABNORMAL HIGH (ref 65–99)
Potassium: 4.9 mmol/L (ref 3.5–5.3)
Sodium: 137 mmol/L (ref 135–146)

## 2015-12-04 LAB — POCT URINALYSIS DIPSTICK
Bilirubin, UA: NEGATIVE
Glucose, UA: 500
KETONES UA: NEGATIVE
Leukocytes, UA: NEGATIVE
Nitrite, UA: NEGATIVE
PH UA: 6.5
PROTEIN UA: 30
SPEC GRAV UA: 1.015
UROBILINOGEN UA: 0.2

## 2015-12-04 LAB — POCT UA - MICROSCOPIC ONLY

## 2015-12-04 MED ORDER — LORAZEPAM 0.5 MG PO TABS: 0.5000 mg | ORAL_TABLET | Freq: Three times a day (TID) | ORAL | Status: DC | PRN

## 2015-12-04 MED ORDER — PROMETHAZINE HCL 25 MG PO TABS: 25.0000 mg | ORAL_TABLET | Freq: Four times a day (QID) | ORAL | Status: DC | PRN

## 2015-12-04 MED ORDER — DOCUSATE SODIUM 100 MG PO CAPS: 100.0000 mg | ORAL_CAPSULE | Freq: Two times a day (BID) | ORAL | Status: DC

## 2015-12-04 NOTE — Telephone Encounter (Signed)
Medication resent to pharmacy. [name],CMA  

## 2015-12-04 NOTE — Patient Instructions (Addendum)
Nausea and vomiting: Use the phenergan first. This is a medicine specifically for nausea and vomiting. I have increased your dose from 12.5mg  to 25mg .  You should only use the ativan when you absolutely need it and the phenergan has not worked. Ativan is not a good medicine to be on long term (it acts similarly to alcohol, which can cause neurological issues with long term use).  I want you to start using miralax or colace every single day. Your goal should be to have a soft/formed stool every day or every other day. I think if you can get your bowels to be regular your nausea and vomiting may improve. This can take weeks to months to get correct. You should adjust the dosing of the miralax/colace until you get the desired number of bowel movements. Start with as directed, 1 cap/packet of miralax a day or colace twice a day.   Come back in about 1 month.

## 2015-12-04 NOTE — Telephone Encounter (Signed)
Pt was seen today by Dr. Lamar Benes and he sent in 2 prescriptions for her. The Phenergan was not received by the pharmacy. Can we call or re-send this. jw

## 2015-12-04 NOTE — Progress Notes (Signed)
   Subjective:    Patient ID: [name] [name], female    DOB: [birth year], 49 y.o.   MRN: [id number]  HPI  CC: nausea  # Nausea:  This is a chronic issue for her, started ever since she had a SBO/surgery to fix "small bowel wrapped around ovaries" -- had a hysterectomy at that time  She has continued to have vomiting as well.  Nauseas all day. She has used ativan specifically for nausea and vomiting (not anxiety). She says zofran and reglan do nothing for her. She does say phenergan is helpful. ROS: intermittent abdominal pain, no fevers  # Urinary symptoms  Had a UTI and was in the hospital last month.  Not having specific dysuria but is having a lower abdominal pressure when she voids  No change in frequency or urge incontinence  # Ulcer in mouth  Went to ENT, they did not want to biopsy since it was healing/resolving  Not very painful right now  Social Hx: current smoker  Review of Systems   See HPI for ROS.   Past medical history, surgical, family, and social history reviewed and updated in the EMR as appropriate. Objective:  BP 174/63 mmHg  Pulse 63  Temp(Src) 98 F (36.7 C) (Oral)  Ht 5' 1.5" (1.562 m)  Wt 145 lb 9.6 oz (66.044 kg)  BMI 27.07 kg/m2 Vitals and nursing note reviewed  General: NAD  ENTM: there is a palat ulcer noted on the roof of her mouth that appears to be healing CV: RRR, normal s1s2, no murmurs, rubs or gallop Resp: clear to auscultation bilaterally, normal effort Abdomen: soft, there is some mild epigastric tenderness, no suprapubic tenderness, no rebound or guarding, normal bowel sounds MSK: no deformities noted Skin: no rashes Neuro: alert and oriented, no deficits noted. Normal gait. Psych: normal mood & affect, normal thought content & speech   Assessment & Plan:  Cyclic vomiting syndrome Likely continuing with her current complaints of nausea and vomiting, abdominal pain. Of note she does not seem to be having regular BMs, and  her entire issue seems to have started after a SBO; also with history of gastroparesis. States she currently takesthe ativan to have with the nausea. I cautioned her about this medication's warnings/long term side effects and recommended that she only use this as an absolute last measure. Rx sent in for phenergan 25mg  (up from 12.5mg ) and instructed to use this primarily for her nausea (she states zofran and reglan do not help). Short rx #30 refill ativan also given. Also recommended she restart miralax/stool softener and take this regularly to ensure constipation is not contributing to her issues. Follow up 1 month.  Cystitis Some urinary symptoms. UA negative. Recommended observation and RTC if symptoms worsen.  CKD (chronic kidney disease) Check BMP post hospitalization.  Oral ulcer Improving. ENT did not feel biopsy was needed. Follow up as needed.

## 2015-12-07 NOTE — Assessment & Plan Note (Signed)
Improving. ENT did not feel biopsy was needed. Follow up as needed.

## 2015-12-07 NOTE — Assessment & Plan Note (Signed)
Check BMP post hospitalization.

## 2015-12-07 NOTE — Assessment & Plan Note (Addendum)
Likely continuing with her current complaints of nausea and vomiting, abdominal pain. Of note she does not seem to be having regular BMs, and her entire issue seems to have started after a SBO; also with history of gastroparesis. States she currently takesthe ativan to have with the nausea. I cautioned her about this medication's warnings/long term side effects and recommended that she only use this as an absolute last measure. Rx sent in for phenergan 25mg  (up from 12.5mg ) and instructed to use this primarily for her nausea (she states zofran and reglan do not help). Short rx #30 refill ativan also given. Also recommended she restart miralax/stool softener and take this regularly to ensure constipation is not contributing to her issues. Follow up 1 month.

## 2015-12-07 NOTE — Assessment & Plan Note (Signed)
Some urinary symptoms. UA negative. Recommended observation and RTC if symptoms worsen.

## 2015-12-16 ENCOUNTER — Encounter (HOSPITAL_COMMUNITY): Payer: Self-pay

## 2015-12-16 ENCOUNTER — Observation Stay (HOSPITAL_COMMUNITY)
Admission: EM | Admit: 2015-12-16 | Discharge: 2015-12-17 | Disposition: A | Discharge status: Home / Self Care | Payer: Medicare Other | Attending: Family Medicine | Admitting: Family Medicine

## 2015-12-16 DIAGNOSIS — K219 Gastro-esophageal reflux disease without esophagitis: Secondary | ICD-10-CM | POA: Diagnosis not present

## 2015-12-16 DIAGNOSIS — Z881 Allergy status to other antibiotic agents status: Secondary | ICD-10-CM | POA: Diagnosis not present

## 2015-12-16 DIAGNOSIS — G43909 Migraine, unspecified, not intractable, without status migrainosus: Secondary | ICD-10-CM | POA: Insufficient documentation

## 2015-12-16 DIAGNOSIS — Z794 Long term (current) use of insulin: Secondary | ICD-10-CM | POA: Diagnosis not present

## 2015-12-16 DIAGNOSIS — I252 Old myocardial infarction: Secondary | ICD-10-CM | POA: Insufficient documentation

## 2015-12-16 DIAGNOSIS — E1121 Type 2 diabetes mellitus with diabetic nephropathy: Secondary | ICD-10-CM

## 2015-12-16 DIAGNOSIS — E7800 Pure hypercholesterolemia, unspecified: Secondary | ICD-10-CM | POA: Diagnosis not present

## 2015-12-16 DIAGNOSIS — Z91030 Bee allergy status: Secondary | ICD-10-CM | POA: Diagnosis not present

## 2015-12-16 DIAGNOSIS — R110 Nausea: Secondary | ICD-10-CM | POA: Diagnosis not present

## 2015-12-16 DIAGNOSIS — E1122 Type 2 diabetes mellitus with diabetic chronic kidney disease: Secondary | ICD-10-CM | POA: Diagnosis not present

## 2015-12-16 DIAGNOSIS — F17210 Nicotine dependence, cigarettes, uncomplicated: Secondary | ICD-10-CM | POA: Insufficient documentation

## 2015-12-16 DIAGNOSIS — N179 Acute kidney failure, unspecified: Secondary | ICD-10-CM | POA: Diagnosis not present

## 2015-12-16 DIAGNOSIS — E1165 Type 2 diabetes mellitus with hyperglycemia: Secondary | ICD-10-CM | POA: Diagnosis not present

## 2015-12-16 DIAGNOSIS — Z888 Allergy status to other drugs, medicaments and biological substances status: Secondary | ICD-10-CM | POA: Insufficient documentation

## 2015-12-16 DIAGNOSIS — K3184 Gastroparesis: Secondary | ICD-10-CM | POA: Insufficient documentation

## 2015-12-16 DIAGNOSIS — G4700 Insomnia, unspecified: Secondary | ICD-10-CM | POA: Diagnosis not present

## 2015-12-16 DIAGNOSIS — E860 Dehydration: Secondary | ICD-10-CM | POA: Diagnosis not present

## 2015-12-16 DIAGNOSIS — G43A1 Cyclical vomiting, intractable: Principal | ICD-10-CM | POA: Insufficient documentation

## 2015-12-16 DIAGNOSIS — F1290 Cannabis use, unspecified, uncomplicated: Secondary | ICD-10-CM | POA: Diagnosis not present

## 2015-12-16 DIAGNOSIS — Z7982 Long term (current) use of aspirin: Secondary | ICD-10-CM | POA: Insufficient documentation

## 2015-12-16 DIAGNOSIS — E1143 Type 2 diabetes mellitus with diabetic autonomic (poly)neuropathy: Secondary | ICD-10-CM | POA: Insufficient documentation

## 2015-12-16 DIAGNOSIS — F329 Major depressive disorder, single episode, unspecified: Secondary | ICD-10-CM | POA: Insufficient documentation

## 2015-12-16 DIAGNOSIS — R1115 Cyclical vomiting syndrome unrelated to migraine: Secondary | ICD-10-CM | POA: Diagnosis present

## 2015-12-16 DIAGNOSIS — K2970 Gastritis, unspecified, without bleeding: Secondary | ICD-10-CM | POA: Diagnosis not present

## 2015-12-16 DIAGNOSIS — N183 Chronic kidney disease, stage 3 (moderate): Secondary | ICD-10-CM | POA: Diagnosis not present

## 2015-12-16 DIAGNOSIS — R1110 Vomiting, unspecified: Secondary | ICD-10-CM | POA: Diagnosis present

## 2015-12-16 DIAGNOSIS — E559 Vitamin D deficiency, unspecified: Secondary | ICD-10-CM | POA: Diagnosis not present

## 2015-12-16 DIAGNOSIS — I129 Hypertensive chronic kidney disease with stage 1 through stage 4 chronic kidney disease, or unspecified chronic kidney disease: Secondary | ICD-10-CM | POA: Insufficient documentation

## 2015-12-16 DIAGNOSIS — R112 Nausea with vomiting, unspecified: Secondary | ICD-10-CM | POA: Diagnosis present

## 2015-12-16 LAB — COMPREHENSIVE METABOLIC PANEL
ALBUMIN: 4.7 g/dL (ref 3.5–5.0)
ALT: 14 U/L (ref 14–54)
AST: 22 U/L (ref 15–41)
Alkaline Phosphatase: 95 U/L (ref 38–126)
Anion gap: 19 — ABNORMAL HIGH (ref 5–15)
BILIRUBIN TOTAL: 1.2 mg/dL (ref 0.3–1.2)
BUN: 40 mg/dL — AB (ref 6–20)
CO2: 22 mmol/L (ref 22–32)
CREATININE: 2.5 mg/dL — AB (ref 0.44–1.00)
Calcium: 10.3 mg/dL (ref 8.9–10.3)
Chloride: 99 mmol/L — ABNORMAL LOW (ref 101–111)
GFR, EST AFRICAN AMERICAN: 25 mL/min — AB (ref >60)
GFR, EST NON AFRICAN AMERICAN: 21 mL/min — AB (ref >60)
Glucose, Bld: 425 mg/dL — ABNORMAL HIGH (ref 65–99)
Potassium: 3.8 mmol/L (ref 3.5–5.1)
SODIUM: 140 mmol/L (ref 135–145)
TOTAL PROTEIN: 8.2 g/dL — AB (ref 6.5–8.1)

## 2015-12-16 LAB — URINALYSIS, ROUTINE W REFLEX MICROSCOPIC
BILIRUBIN URINE: NEGATIVE
KETONES UR: 15 mg/dL — AB
Leukocytes, UA: NEGATIVE
Nitrite: NEGATIVE
PROTEIN: 100 mg/dL — AB
Specific Gravity, Urine: 1.019 (ref 1.005–1.030)
pH: 5.5 (ref 5.0–8.0)

## 2015-12-16 LAB — URINE MICROSCOPIC-ADD ON
BACTERIA UA: NONE SEEN
WBC, UA: NONE SEEN WBC/hpf (ref 0–5)

## 2015-12-16 LAB — CBC WITH DIFFERENTIAL/PLATELET
BASOS ABS: 0 10*3/uL (ref 0.0–0.1)
BASOS PCT: 0 %
EOS ABS: 0 10*3/uL (ref 0.0–0.7)
Eosinophils Relative: 0 %
HEMATOCRIT: 34.5 % — AB (ref 36.0–46.0)
HEMOGLOBIN: 11.8 g/dL — AB (ref 12.0–15.0)
Lymphocytes Relative: 8 %
Lymphs Abs: 1 10*3/uL (ref 0.7–4.0)
MCH: 31.2 pg (ref 26.0–34.0)
MCHC: 34.2 g/dL (ref 30.0–36.0)
MCV: 91.3 fL (ref 78.0–100.0)
MONO ABS: 0.6 10*3/uL (ref 0.1–1.0)
Monocytes Relative: 5 %
NEUTROS ABS: 11 10*3/uL — AB (ref 1.7–7.7)
NEUTROS PCT: 87 %
Platelets: 214 10*3/uL (ref 150–400)
RBC: 3.78 MIL/uL — ABNORMAL LOW (ref 3.87–5.11)
RDW: 14 % (ref 11.5–15.5)
WBC: 12.6 10*3/uL — ABNORMAL HIGH (ref 4.0–10.5)

## 2015-12-16 LAB — CREATININE, SERUM
CREATININE: 2.16 mg/dL — AB (ref 0.44–1.00)
GFR, EST AFRICAN AMERICAN: 29 mL/min — AB (ref >60)
GFR, EST NON AFRICAN AMERICAN: 25 mL/min — AB (ref >60)

## 2015-12-16 LAB — CBC
HEMATOCRIT: 32.4 % — AB (ref 36.0–46.0)
HEMOGLOBIN: 11.2 g/dL — AB (ref 12.0–15.0)
MCH: 31.6 pg (ref 26.0–34.0)
MCHC: 34.6 g/dL (ref 30.0–36.0)
MCV: 91.5 fL (ref 78.0–100.0)
Platelets: 174 10*3/uL (ref 150–400)
RBC: 3.54 MIL/uL — AB (ref 3.87–5.11)
RDW: 14.2 % (ref 11.5–15.5)
WBC: 9.7 10*3/uL (ref 4.0–10.5)

## 2015-12-16 LAB — GLUCOSE, CAPILLARY
GLUCOSE-CAPILLARY: 256 mg/dL — AB (ref 65–99)
GLUCOSE-CAPILLARY: 30 mg/dL — AB (ref 65–99)
Glucose-Capillary: 91 mg/dL (ref 65–99)
Glucose-Capillary: 250 mg/dL — ABNORMAL HIGH (ref 65–99)

## 2015-12-16 LAB — LIPASE, BLOOD: LIPASE: 31 U/L (ref 11–51)

## 2015-12-16 MED ORDER — ASPIRIN EC 81 MG PO TBEC
81.0000 mg | DELAYED_RELEASE_TABLET | Freq: Every day | ORAL | Status: DC
  Filled 2015-12-16 (×2): qty 1

## 2015-12-16 MED ORDER — PROMETHAZINE HCL 25 MG/ML IJ SOLN: 25.0000 mg | INTRAMUSCULAR | Status: DC | PRN

## 2015-12-16 MED ORDER — DOCUSATE SODIUM 100 MG PO CAPS
100.0000 mg | ORAL_CAPSULE | Freq: Two times a day (BID) | ORAL | Status: DC
  Administered 2015-12-16 – 2015-12-17 (×2): 100 mg via ORAL
  Filled 2015-12-16 (×2): qty 1

## 2015-12-16 MED ORDER — INSULIN GLARGINE 100 UNIT/ML ~~LOC~~ SOLN
11.0000 [IU] | Freq: Every day | SUBCUTANEOUS | Status: DC
  Filled 2015-12-16: qty 0.11

## 2015-12-16 MED ORDER — SODIUM CHLORIDE 0.9 % IV SOLN
INTRAVENOUS | Status: AC
  Administered 2015-12-16: 15:00:00 via INTRAVENOUS

## 2015-12-16 MED ORDER — LORAZEPAM 1 MG PO TABS: 1.0000 mg | ORAL_TABLET | Freq: Four times a day (QID) | ORAL | Status: DC | PRN

## 2015-12-16 MED ORDER — OXYCODONE HCL 5 MG PO TABS
5.0000 mg | ORAL_TABLET | ORAL | Status: DC | PRN
  Administered 2015-12-16 – 2015-12-17 (×3): 5 mg via ORAL
  Filled 2015-12-16 (×3): qty 1

## 2015-12-16 MED ORDER — POLYETHYLENE GLYCOL 3350 17 G PO PACK: 17.0000 g | PACK | Freq: Every day | ORAL | Status: DC | PRN

## 2015-12-16 MED ORDER — LORAZEPAM 2 MG/ML IJ SOLN: 1.0000 mg | Freq: Four times a day (QID) | INTRAMUSCULAR | Status: DC | PRN

## 2015-12-16 MED ORDER — LORAZEPAM 2 MG/ML IJ SOLN
1.0000 mg | Freq: Once | INTRAMUSCULAR | Status: AC
  Administered 2015-12-16: 1 mg via INTRAVENOUS
  Filled 2015-12-16: qty 1

## 2015-12-16 MED ORDER — PROMETHAZINE HCL 25 MG/ML IJ SOLN
25.0000 mg | Freq: Once | INTRAMUSCULAR | Status: AC
  Administered 2015-12-16: 25 mg via INTRAVENOUS
  Filled 2015-12-16: qty 1

## 2015-12-16 MED ORDER — ACETAMINOPHEN 650 MG RE SUPP: 650.0000 mg | Freq: Four times a day (QID) | RECTAL | Status: DC | PRN

## 2015-12-16 MED ORDER — SODIUM CHLORIDE 0.9 % IV BOLUS (SEPSIS)
2000.0000 mL | Freq: Once | INTRAVENOUS | Status: AC
Start: 2015-12-16 — End: 2015-12-16
  Administered 2015-12-16: 2000 mL via INTRAVENOUS

## 2015-12-16 MED ORDER — HYDRALAZINE HCL 10 MG PO TABS
10.0000 mg | ORAL_TABLET | Freq: Three times a day (TID) | ORAL | Status: DC
  Administered 2015-12-16 – 2015-12-17 (×3): 10 mg via ORAL
  Filled 2015-12-16 (×3): qty 1

## 2015-12-16 MED ORDER — INSULIN ASPART 100 UNIT/ML ~~LOC~~ SOLN
5.0000 [IU] | Freq: Once | SUBCUTANEOUS | Status: AC
  Administered 2015-12-16: 5 [IU] via SUBCUTANEOUS

## 2015-12-16 MED ORDER — ENOXAPARIN SODIUM 40 MG/0.4ML ~~LOC~~ SOLN
40.0000 mg | SUBCUTANEOUS | Status: DC
  Filled 2015-12-16: qty 0.4

## 2015-12-16 MED ORDER — INSULIN ASPART 100 UNIT/ML ~~LOC~~ SOLN
0.0000 [IU] | SUBCUTANEOUS | Status: DC
  Administered 2015-12-16: 8 [IU] via SUBCUTANEOUS
  Administered 2015-12-17: 3 [IU] via SUBCUTANEOUS
  Administered 2015-12-17: 11 [IU] via SUBCUTANEOUS

## 2015-12-16 MED ORDER — HALOPERIDOL LACTATE 5 MG/ML IJ SOLN
2.0000 mg | Freq: Once | INTRAMUSCULAR | Status: AC
  Administered 2015-12-16: 2 mg via INTRAVENOUS
  Filled 2015-12-16: qty 1

## 2015-12-16 MED ORDER — CARVEDILOL 12.5 MG PO TABS
25.0000 mg | ORAL_TABLET | Freq: Two times a day (BID) | ORAL | Status: DC
  Administered 2015-12-16 – 2015-12-17 (×2): 25 mg via ORAL
  Filled 2015-12-16 (×2): qty 2

## 2015-12-16 MED ORDER — HYDROMORPHONE HCL 1 MG/ML IJ SOLN
1.0000 mg | Freq: Once | INTRAMUSCULAR | Status: AC
  Administered 2015-12-16: 1 mg via INTRAVENOUS
  Filled 2015-12-16: qty 1

## 2015-12-16 MED ORDER — FAMOTIDINE 20 MG PO TABS
20.0000 mg | ORAL_TABLET | Freq: Two times a day (BID) | ORAL | Status: DC
  Administered 2015-12-16: 20 mg via ORAL
  Filled 2015-12-16 (×2): qty 1

## 2015-12-16 MED ORDER — PROMETHAZINE HCL 25 MG PO TABS
25.0000 mg | ORAL_TABLET | Freq: Four times a day (QID) | ORAL | Status: DC | PRN
  Administered 2015-12-17 (×2): 25 mg via ORAL
  Filled 2015-12-16 (×2): qty 1

## 2015-12-16 MED ORDER — ACETAMINOPHEN 325 MG PO TABS: 650.0000 mg | ORAL_TABLET | Freq: Four times a day (QID) | ORAL | Status: DC | PRN

## 2015-12-16 NOTE — ED Notes (Signed)
Attempted to call report, RN to return call 

## 2015-12-16 NOTE — ED Notes (Signed)
Bed: [id number] Expected date:  Expected time:  Means of arrival:  Comments: EMS- 49yo F, n/v/d

## 2015-12-16 NOTE — Progress Notes (Signed)
Paged family medicine to notify that patient is on the unit.

## 2015-12-16 NOTE — ED Notes (Signed)
Patient moving around during vitals. BP reset multiple times due to movement.

## 2015-12-16 NOTE — Progress Notes (Signed)
Patient admitted for [facility]. Patient is drowsy but easily aroused. Patient resting at this time. [facility] admission notified that patient is on the unit.,

## 2015-12-16 NOTE — ED Provider Notes (Signed)
CSN: [id number]     Arrival date & time 12/16/15  0905 History   First MD Initiated Contact with Patient 12/16/15 0905     Chief Complaint  Patient presents with  . Abdominal Pain  . Emesis  . Diarrhea     (Consider location/radiation/quality/duration/timing/severity/associated sxs/prior Treatment) The history is provided by the patient.  [name] [name] is a 49 y.o. female hx of pancreatitis, cyclic vomiting syndrome, HTN, gastroparesis, who presented with vomiting. Patient has chronic abdominal pain associated with 60 vomiting syndrome from gastroparesis from diabetes. Patient was admitted twice last month for similar symptoms. States that she started vomiting since yesterday. She's been taking her Ativan, Dilaudid, Zofran, Phenergan at home with no relief. Denies any fevers or chills or urinary symptoms. Patient denies any alcohol use but does use marijuana occasionally.    Past Medical History  Diagnosis Date  . PANCREATITIS 05/09/2008  . Cyclic vomiting syndrome   . Hypertension   . Complication of anesthesia     "problems waking up"  . Heart murmur   . Myocardial infarction (HCC) 07/2007    "they say I've had a silent one; I don't know"  . Pneumonia   . Anemia   . Blood transfusion   . GERD (gastroesophageal reflux disease)   . Anxiety   . Depression   . Smoker   . Marijuana smoker, continuous (HCC)   . NEUROPATHY, DIABETIC 02/23/2007  . Type II diabetes mellitus (HCC)   . Diabetic gastroparesis (HCC)     /e-chart  . Chronic kidney disease (CKD), stage III (moderate)   . Nausea & vomiting 06/10/2015  . Intestinal impaction (HCC)    Past Surgical History  Procedure Laterality Date  . Port-a-cath placement  ~ 2008    right chest; "poor access; frequent hospitalizations"  . Laparotomy and lysis of adhesions    . Abdominal hysterectomy  02/2002  . Cholecystectomy  1980's  . Cardiac catheterization    . Dilation and evacuation  X3  . Left hand surgery    . Cataract  extraction w/ intraocular lens  implant, bilateral    . Colonoscopy    . Upper gi endoscopy    . Cesarean section     Family History  Problem Relation Age of Onset  . Diabetes type II Mother   . Diabetes Mother   . Hypertension Mother   . Diabetes type II Sister   . Diabetes Sister   . Hypertension Sister   . Hypertension Brother    Social History  Substance Use Topics  . Smoking status: Current Some Day Smoker -- 0.50 packs/day for 22 years    Types: Cigarettes  . Smokeless tobacco: Never Used  . Alcohol Use: No   OB History    Gravida Para Term Preterm AB TAB SAB Ectopic Multiple Living   7    3 3    4     Review of Systems  Gastrointestinal: Positive for vomiting, abdominal pain and diarrhea.  All other systems reviewed and are negative.     Allergies  Bee venom; Acetaminophen; Novolog; Other; Tegaderm ag mesh; and Erythromycin  Home Medications   Prior to Admission medications   Medication Sig Start Date End Date Taking? Authorizing Provider  insulin glargine (LANTUS) 100 UNIT/ML injection Inject 0.22 mLs (22 Units total) into the skin daily. 06/02/15  Yes Maria T Thekkekandam, MD  lisinopril (PRINIVIL,ZESTRIL) 40 MG tablet Take 1 tablet (40 mg total) by mouth daily. 08/13/15  Yes James R   Joyner, MD  LORazepam (ATIVAN) 0.5 MG tablet Take 1 tablet (0.5 mg total) by mouth every 8 (eight) hours as needed (nausea, vomiting). 12/04/15  Yes Andrew M Wight, MD  polyethylene glycol (MIRALAX / GLYCOLAX) packet Take 17 g by mouth daily as needed. May increase in 2-3 days to 2 times daily, Until stooling regularly Patient taking differently: Take 17 g by mouth daily as needed for mild constipation. May increase in 2-3 days to 2 times daily, Until stooling regularly 06/02/15  Yes Maria T Thekkekandam, MD  pregabalin (LYRICA) 25 MG capsule TAKE 1 TABLET BY MOUTH AT BEDTIME AS NEEDED 08/21/15  Yes James R Joyner, MD  promethazine (PHENERGAN) 25 MG tablet Take 1 tablet (25 mg total) by  mouth every 6 (six) hours as needed for nausea or vomiting. 12/04/15  Yes Andrew M Wight, MD  aspirin EC 81 MG tablet Take 1 tablet (81 mg total) by mouth daily. Patient not taking: Reported on 10/15/2015 06/02/15   Maria T Thekkekandam, MD  blood glucose meter kit and supplies KIT Dispense based on patient and insurance preference. Use up to four times daily as directed. (FOR ICD-9 250.00, 250.01). 06/02/15   Maria T Thekkekandam, MD  carvedilol (COREG) 25 MG tablet Take 1 tablet (25 mg total) by mouth 2 (two) times daily with a meal. 09/29/15   Alyssa A Haney, MD  cetirizine (ZYRTEC) 10 MG tablet Take 1 tablet (10 mg total) by mouth daily. Patient not taking: Reported on 11/22/2015 06/20/15   Maria T Thekkekandam, MD  docusate sodium (COLACE) 100 MG capsule Take 1 capsule (100 mg total) by mouth 2 (two) times daily. 12/04/15   Andrew M Wight, MD  hydrALAZINE (APRESOLINE) 10 MG tablet Take 1 tablet (10 mg total) by mouth 3 (three) times daily. Patient not taking: Reported on 10/15/2015 09/29/15   Alyssa A Haney, MD  HYDROmorphone (DILAUDID) 2 MG tablet Take 1 tablet (2 mg total) by mouth every 4 (four) hours as needed for severe pain. 11/24/15   Katy Dodd Mayo, MD  lidocaine (XYLOCAINE) 2 % solution Use as directed 20 mLs in the mouth or throat every 3 (three) hours as needed for mouth pain. 11/24/15   Katy Dodd Mayo, MD  oxyCODONE (ROXICODONE) 5 MG immediate release tablet Take 1 tablet (5 mg total) by mouth every 4 (four) hours as needed for severe pain. Patient not taking: Reported on 11/22/2015 11/04/15   Alyssa A Haney, MD  predniSONE (DELTASONE) 10 MG tablet Take 2 pills (20mg) daily for 5 days, then 1 pill (10mg) daily for 5 days, then 1/2 pill (5mg) daily for 5 days. 11/24/15   Katy Dodd Mayo, MD   BP 172/134 mmHg  Pulse 118  Temp(Src) 97.6 F (36.4 C) (Oral)  Resp 20  SpO2 100% Physical Exam  Constitutional: She is oriented to person, place, and time.  Vomiting, retching, uncomfortable    HENT:  Head: Normocephalic.  Mouth/Throat: Oropharynx is clear and moist.  Eyes: Conjunctivae are normal. Pupils are equal, round, and reactive to light.  Neck: Normal range of motion. Neck supple.  Cardiovascular: Regular rhythm and normal heart sounds.   Tachycardic   Pulmonary/Chest: Effort normal and breath sounds normal. No respiratory distress. She has no wheezes. She has no rales.  Abdominal: Soft. Bowel sounds are normal.  + epigastric tenderness, no CVAT   Musculoskeletal: Normal range of motion. She exhibits no edema or tenderness.  Neurological: She is alert and oriented to person, place, and time. No cranial   nerve deficit. Coordination normal.  Skin: Skin is warm and dry.  Psychiatric: She has a normal mood and affect. Her behavior is normal. Judgment and thought content normal.  Nursing note and vitals reviewed.   ED Course  Procedures (including critical care time) Labs Review Labs Reviewed  CBC WITH DIFFERENTIAL/PLATELET - Abnormal; Notable for the following:    WBC 12.6 (*)    RBC 3.78 (*)    Hemoglobin 11.8 (*)    HCT 34.5 (*)    Neutro Abs 11.0 (*)    All other components within normal limits  COMPREHENSIVE METABOLIC PANEL - Abnormal; Notable for the following:    Chloride 99 (*)    Glucose, Bld 425 (*)    BUN 40 (*)    Creatinine, Ser 2.50 (*)    Total Protein 8.2 (*)    GFR calc non Af Amer 21 (*)    GFR calc Af Amer 25 (*)    Anion gap 19 (*)    All other components within normal limits  LIPASE, BLOOD  URINALYSIS, ROUTINE W REFLEX MICROSCOPIC (NOT AT [facility])    Imaging Review No results found. I have personally reviewed and evaluated these images and lab results as part of my medical decision-making.   EKG Interpretation None      MDM   Final diagnoses:  None    [name] [name] is a 49 y.o. female here with vomiting, ab pain. Likely chronic gastroparesis. Had acute renal failure a month ago requiring admission. Will check chemistry, CBC,  lipase, UA. Will hydrate and give antiemetics and reassess.   10:58 AM Patient still tachy, nauseated. Unable to keep anything down. Cr. Increased to 2.5 from 1.7 on discharge. Will admit for dehydration, vomiting, acute renal failure, gastroparesis.      H , MD 12/16/15 1059 

## 2015-12-16 NOTE — ED Notes (Addendum)
Per EMS, Pt, from home, c/o generalized abdominal pain and n/v/d x 1 day.  Pain score 10/10.  Hx of pancreatitis, cyclic vomiting syndrome, and HTN.  Pt reported to EMS that she has a port, because "they have to give me fluid and Zofran all the time."  CBG 411.

## 2015-12-16 NOTE — H&P (Signed)
[facility] Admission History and Physical Service Pager: [phone]  Patient name: [name] [name] Medical record number: [id number] Date of birth: 1966/09/04 Age: 49 y.o. Gender: female  Primary Care Provider: Phill Myron, MD Consultants: None Code Status: Full  Chief Complaint: Vomiting  Assessment and Plan: [name] [name] is a 49 y.o. female presenting with nausea and vomiting. PMH is significant for cyclic vomiting syndrome, gastroparesis, marijuana abuse, depression, DM, HTN, CKD III.   Nausea/Vomiting with abdominal pain. Patient with longstanding history of cyclic vomiting associated with marijuana use (endorsed use of THC 2 days ago). Current episode consistent with past flares. Patient also with history of gastroparesis that is also contributing. Lipase normal. Hemoglobin normal; 11.8. No concern for active bleed currently. Possible Mallory-Weiss tear since she saw some blood with emesis. Abdominal exam with diffuse tenderness and guarding at RUQ and epigastric region. Doubt obstruction or other acute intra-abdominal pathology. Patient endorses having an appetite and would like to try to eat. Denies any further nausea/vomitting since ED treatment.   - Admit to [place], attending Dr. McDiarmid - Advance diet as tolerated  - mIVF NS 150 mL/hr while PO intake is limited; adjust as appropriate  - Continue home anti-emetics: phenergan 30m q6hrs; PO or IV - Do not order ativan, as not indicated for cyclic vomiting syndrome; could consider compazine in place of this for n/v and anxiety - Consider starting reglan or other pro-motility agent.  - Continue home oxycodone IR 5 mg q4h prn for pain. Tylenol 650 mg prn ordered.  - Patient cannot tolerate PPIs due to nausea, will try pepcid 20 mg BID  - Strict I/Os - Consider outpatient referral to GI for gastroparesis and cyclic vomiting  DM / Hyperglycemia. Blood glucose 425 on admission. Anion gap elevated to 19  however normal bicarb. UA with >1000 glucose. Last A1c 9.6 two months ago.  - Half home lantus to 11U started for tonight; dicontinue if patient not with good PO intake.  - SSI q4hrs; adjust to mealtime tomorrow - one dose of 5U Novolog ordered now to lower blood sugar - Holding home lyrica for neuropathy, as it's a prn  - Ordered Hgb A1c.   AoCKD III. Cr 2.5 on admission, baseline ~2. Likely pre-renal in setting of dehydration. Ketones and hyaline cast present in urine.  - Hold home lisinopril - mIVF as above - Avoid nephrotoxic medications - Trend BMP  HTN. Elevated to SBPs 170 on admission.  - Continue home carvedilol 252mbid, hydralazine 1024mid - Hold home lisinopril in setting of AKI - Consider starting additional agent such as amlodipine if continues to be elevated.   FEN/GI: ADAT, mIVF as above Prophylaxis: Lovenox  Disposition: Admitted under observation to the [facility] under attending Dr McDiarmid. Anticipate discharge home once tolerating PO and Cr improves.   History of Present Illness:  [name] [name] a 49 21o. female presenting with nausea and vomiting.  Vomiting started Monday and occurred every 20-30 minutes. She has not eaten since Sunday. She also has not been sleeping due to the frequency of her vomiting. She noticed a little blood in her emesis, "not much." She denies fevers but states she has felt cold. She has had normal formed bowel movements but says stools have gotten "thinner" but not having diarrhea. She reports abdominal pain that is in the center of her abdomen and is constant, dull and aching; this is common with her vomiting. She tried to take her home medications ("all  of them"), which include phenergan, but could not keep anything down.   When asked what she thinks may be causing her vomiting, she suggested it could have been something she ate at church. She had an onion dip and had green vomit at first but does not remember eating anything green. She has  not smoked weed or cigarettes for the past 2 days because she has not felt well. She has not had alcohol for 6 years.   In [facility] ED, she was given IV phenergan 25 mg, IV dilaudid 1 mg, IV haldol 2 mg, and IV ativan 1 mg. She has not had emesis since then. Lipase 31. UA with hyaline casts, microscopic negative for nitrites or leukocytes, small hgb and glucose > 1000.   Review Of Systems: Per HPI with the following additions: She has not been sick recently, no recent colds.  Otherwise the remainder of the systems were negative.  Patient Active Problem List   Diagnosis Date Noted  . Hyperkalemia 11/28/2015  . Elevated cholesterol 11/28/2015  . AKI (acute kidney injury) ([place])   . Absolute anemia   . Hard palate ulcer   . Oral ulcer 11/22/2015  . Tooth ache   . Cystitis   . Vomiting 11/15/2015  . Gastroparesis 08/21/2015  . Hematuria 07/09/2015  . Blood in stool 06/23/2015  . Constipation 06/06/2015  . Onychomycosis 03/25/2015  . Encounter for chronic pain management 01/06/2015  . Vitamin D deficiency 11/28/2014  . At risk for polypharmacy 08/15/2014  . Other and unspecified hyperlipidemia 08/15/2014  . Leg cramping 07/18/2014  . Rotator cuff syndrome 04/19/2014  . Biceps tendinopathy of right upper extremity 04/05/2014  . Protein-calorie malnutrition, severe ([place]) 12/17/2013  . Anemia 11/15/2013  . CKD (chronic kidney disease) 08/17/2013  . Esophagitis 04/18/2013  . Preventative health care 09/18/2012  . High risk social situation 08/31/2012  . Allergic rhinitis, seasonal 05/04/2012  . Marijuana smoker ([place]) 11/03/2011  . Uncontrolled secondary diabetes mellitus with stage 3 CKD (GFR 30-59) ([place]) 10/23/2011  . Cyclic vomiting syndrome 08/03/2011  . Hot flashes 07/29/2011  . Backache 04/10/2007  . DEPRESSION, MAJOR, RECURRENT 02/23/2007  . Anxiety state 02/23/2007  . TOBACCO DEPENDENCE 02/23/2007  . MIGRAINE, UNSPEC., W/O INTRACTABLE MIGRAINE 02/23/2007  . Essential  hypertension, malignant 02/23/2007  . INSOMNIA NOS 02/23/2007  . NEUROPATHY, DIABETIC 02/23/2007  . GERD 01/27/2004    Past Medical History: Past Medical History  Diagnosis Date  . PANCREATITIS 05/09/2008  . Cyclic vomiting syndrome   . Hypertension   . Complication of anesthesia     "problems waking up"  . Heart murmur   . Myocardial infarction [facility]) 07/2007    "they say I've had a silent one; I don't know"  . Pneumonia   . Anemia   . Blood transfusion   . GERD (gastroesophageal reflux disease)   . Anxiety   . Depression   . Smoker   . Marijuana smoker, continuous ([place])   . NEUROPATHY, DIABETIC 02/23/2007  . Type II diabetes mellitus ([place])   . Diabetic gastroparesis ([place])     /e-chart  . Chronic kidney disease (CKD), stage III (moderate)   . Nausea & vomiting 06/10/2015  . Intestinal impaction [facility])     Past Surgical History: Past Surgical History  Procedure Laterality Date  . Port-a-cath placement  ~ 2008    right chest; "poor access; frequent hospitalizations"  . Laparotomy and lysis of adhesions    . Abdominal hysterectomy  02/2002  . Cholecystectomy  1980's  . Cardiac catheterization    . Dilation and evacuation  X3  . Left hand surgery    . Cataract extraction w/ intraocular lens  implant, bilateral    . Colonoscopy    . Upper gi endoscopy    . Cesarean section      Social History: Social History  Substance Use Topics  . Smoking status: Current Some Day Smoker -- 0.50 packs/day for 22 years    Types: Cigarettes  . Smokeless tobacco: Never Used  . Alcohol Use: No   Additional social history: Has not had a drink in 6 years. Last smoked two days ago [facility] and tobacco) Please also refer to relevant sections of EMR.  Family History: Family History  Problem Relation Age of Onset  . Diabetes type II Mother   . Diabetes Mother   . Hypertension Mother   . Diabetes type II Sister   . Diabetes Sister   . Hypertension Sister   . Hypertension Brother     Allergies and Medications: Allergies  Allergen Reactions  . Bee Venom Anaphylaxis  . Acetaminophen Nausea And Vomiting  . Novolog [Insulin Aspart] Other (See Comments)    Muscles in feet cramp  . Other     Sorbaview causes rash  . Tegaderm Ag Mesh [Silver]     Blisters  . Erythromycin Nausea And Vomiting   Current Facility-Administered Medications on File Prior to Encounter  Medication Dose Route Frequency Provider Last Rate Last Dose  . 0.9 %  sodium chloride infusion   Intravenous Continuous Kinnie Feil, MD 20 mL/hr at 04/19/13 1500 500 mL at 04/19/13 1500   Current Outpatient Prescriptions on File Prior to Encounter  Medication Sig Dispense Refill  . carvedilol (COREG) 25 MG tablet Take 1 tablet (25 mg total) by mouth 2 (two) times daily with a meal. 30 tablet 5  . HYDROmorphone (DILAUDID) 2 MG tablet Take 1 tablet (2 mg total) by mouth every 4 (four) hours as needed for severe pain. 50 tablet 0  . insulin glargine (LANTUS) 100 UNIT/ML injection Inject 0.22 mLs (22 Units total) into the skin daily. 10 mL 2  . lidocaine (XYLOCAINE) 2 % solution Use as directed 20 mLs in the mouth or throat every 3 (three) hours as needed for mouth pain. 100 mL 3  . lisinopril (PRINIVIL,ZESTRIL) 40 MG tablet Take 1 tablet (40 mg total) by mouth daily. 30 tablet 2  . LORazepam (ATIVAN) 0.5 MG tablet Take 1 tablet (0.5 mg total) by mouth every 8 (eight) hours as needed (nausea, vomiting). 30 tablet 0  . polyethylene glycol (MIRALAX / GLYCOLAX) packet Take 17 g by mouth daily as needed. May increase in 2-3 days to 2 times daily, Until stooling regularly (Patient taking differently: Take 17 g by mouth daily as needed for mild constipation. May increase in 2-3 days to 2 times daily, Until stooling regularly) 30 packet prn  . pregabalin (LYRICA) 25 MG capsule TAKE 1 TABLET BY MOUTH AT BEDTIME AS NEEDED 30 capsule 2  . promethazine (PHENERGAN) 25 MG tablet Take 1 tablet (25 mg total) by mouth every 6  (six) hours as needed for nausea or vomiting. 60 tablet 2  . aspirin EC 81 MG tablet Take 1 tablet (81 mg total) by mouth daily. (Patient not taking: Reported on 10/15/2015) 90 tablet 3  . blood glucose meter kit and supplies KIT Dispense based on patient and insurance preference. Use up to four times daily as directed. (FOR ICD-9 250.00, 250.01).  1 each 0  . cetirizine (ZYRTEC) 10 MG tablet Take 1 tablet (10 mg total) by mouth daily. (Patient not taking: Reported on 11/22/2015) 30 tablet 11  . docusate sodium (COLACE) 100 MG capsule Take 1 capsule (100 mg total) by mouth 2 (two) times daily. (Patient not taking: Reported on 12/16/2015) 30 capsule 0  . hydrALAZINE (APRESOLINE) 10 MG tablet Take 1 tablet (10 mg total) by mouth 3 (three) times daily. (Patient not taking: Reported on 10/15/2015) 30 tablet 5  . oxyCODONE (ROXICODONE) 5 MG immediate release tablet Take 1 tablet (5 mg total) by mouth every 4 (four) hours as needed for severe pain. (Patient not taking: Reported on 11/22/2015) 5 tablet 0  . predniSONE (DELTASONE) 10 MG tablet Take 2 pills (39m) daily for 5 days, then 1 pill (13m daily for 5 days, then 1/2 pill (75m14mdaily for 5 days. 18 tablet 0    Objective: BP 148/79 mmHg  Pulse 98  Temp(Src) 98.8 F (37.1 C) (Oral)  Resp 20  SpO2 100% Exam: General: Weak, tired-appearing female, resting in bed, NAD Eyes: PERRL, EOMI ENTM: MM slightly tacky, brown discoloration of tongue Cardiovascular: RRR, S1, S2, no m/r/g Respiratory: CTAB, no wheezes or crackles. Good air movement. Abdomen: Diffusely TTP, mostly in epigastric region and RUQ with slight guarding and patient endorsing some rebound tenderness, +BS, soft MSK: FROM, no edema Skin: No rashes or lesions, warm and dry, skin intact Neuro: AOx3, non-focal, strength and sensation intact Psych: mood and affect appropriate  Labs and Imaging: Results for orders placed or performed during the hospital encounter of 12/16/15 (from the  past 24 hour(s))  CBC with Differential     Status: Abnormal   Collection Time: 12/16/15  9:57 AM  Result Value Ref Range   WBC 12.6 (H) 4.0 - 10.5 K/uL   RBC 3.78 (L) 3.87 - 5.11 MIL/uL   Hemoglobin 11.8 (L) 12.0 - 15.0 g/dL   HCT 34.5 (L) 36.0 - 46.0 %   MCV 91.3 78.0 - 100.0 fL   MCH 31.2 26.0 - 34.0 pg   MCHC 34.2 30.0 - 36.0 g/dL   RDW 14.0 11.5 - 15.5 %   Platelets 214 150 - 400 K/uL   Neutrophils Relative % 87 %   Neutro Abs 11.0 (H) 1.7 - 7.7 K/uL   Lymphocytes Relative 8 %   Lymphs Abs 1.0 0.7 - 4.0 K/uL   Monocytes Relative 5 %   Monocytes Absolute 0.6 0.1 - 1.0 K/uL   Eosinophils Relative 0 %   Eosinophils Absolute 0.0 0.0 - 0.7 K/uL   Basophils Relative 0 %   Basophils Absolute 0.0 0.0 - 0.1 K/uL  Comprehensive metabolic panel     Status: Abnormal   Collection Time: 12/16/15  9:57 AM  Result Value Ref Range   Sodium 140 135 - 145 mmol/L   Potassium 3.8 3.5 - 5.1 mmol/L   Chloride 99 (L) 101 - 111 mmol/L   CO2 22 22 - 32 mmol/L   Glucose, Bld 425 (H) 65 - 99 mg/dL   BUN 40 (H) 6 - 20 mg/dL   Creatinine, Ser 2.50 (H) 0.44 - 1.00 mg/dL   Calcium 10.3 8.9 - 10.3 mg/dL   Total Protein 8.2 (H) 6.5 - 8.1 g/dL   Albumin 4.7 3.5 - 5.0 g/dL   AST 22 15 - 41 U/L   ALT 14 14 - 54 U/L   Alkaline Phosphatase 95 38 - 126 U/L   Total Bilirubin 1.2 0.3 - 1.2  mg/dL   GFR calc non Af Amer 21 (L) >60 mL/min   GFR calc Af Amer 25 (L) >60 mL/min   Anion gap 19 (H) 5 - 15  Lipase, blood     Status: None   Collection Time: 12/16/15  9:57 AM  Result Value Ref Range   Lipase 31 11 - 51 U/L  Urinalysis, Routine w reflex microscopic (not at [facility])     Status: Abnormal   Collection Time: 12/16/15 12:31 PM  Result Value Ref Range   Color, Urine YELLOW YELLOW   APPearance CLEAR CLEAR   Specific Gravity, Urine 1.019 1.005 - 1.030   pH 5.5 5.0 - 8.0   Glucose, UA >1000 (A) NEGATIVE mg/dL   Hgb urine dipstick SMALL (A) NEGATIVE   Bilirubin Urine NEGATIVE NEGATIVE   Ketones, ur 15  (A) NEGATIVE mg/dL   Protein, ur 100 (A) NEGATIVE mg/dL   Nitrite NEGATIVE NEGATIVE   Leukocytes, UA NEGATIVE NEGATIVE  Urine microscopic-add on     Status: Abnormal   Collection Time: 12/16/15 12:31 PM  Result Value Ref Range   Squamous Epithelial / LPF 0-5 (A) NONE SEEN   WBC, UA NONE SEEN 0 - 5 WBC/hpf   RBC / HPF 0-5 0 - 5 RBC/hpf   Bacteria, UA NONE SEEN NONE SEEN   Casts HYALINE CASTS (A) NEGATIVE   Urine-Other MUCOUS PRESENT    *Note: Due to a large number of results and/or encounters for the requested time period, some results have not been displayed. A complete set of results can be found in Results Review.    Rogue Bussing, MD 12/16/2015, 4:08 PM PGY-1, [place] Intern pager: [phone], text pages welcome  With Dimas Chyle, MD; PGY-2  FPTS Upper-Level Resident Addendum  I have independently interviewed and examined the patient. I have discussed the above with the original author and agree with their documentation. My edits for correction/addition/clarification are in pink. Please see also any attending notes.   Katheren Shams, DO PGY-2, [place] Service pager: [phone] (text pages welcome through [facility])

## 2015-12-16 NOTE — ED Notes (Signed)
[facility] notified for transport 

## 2015-12-16 NOTE — Progress Notes (Signed)
Hypoglycemic Event  CBG: 30  Treatment: 8oz of grape juice (pt declined OJ)  Symptoms: shaky, cold, clammy  Follow-up CBG: Time: 2003 CBG Result:91  Possible Reasons for Event: pt has not eaten in awhile  Comments/MD notified:yes at 20:05    Nisha Dhami L

## 2015-12-17 DIAGNOSIS — K3184 Gastroparesis: Secondary | ICD-10-CM | POA: Diagnosis not present

## 2015-12-17 DIAGNOSIS — N179 Acute kidney failure, unspecified: Secondary | ICD-10-CM

## 2015-12-17 DIAGNOSIS — K219 Gastro-esophageal reflux disease without esophagitis: Secondary | ICD-10-CM

## 2015-12-17 DIAGNOSIS — G43A1 Cyclical vomiting, intractable: Secondary | ICD-10-CM | POA: Diagnosis not present

## 2015-12-17 LAB — BASIC METABOLIC PANEL
ANION GAP: 8 (ref 5–15)
BUN: 33 mg/dL — ABNORMAL HIGH (ref 6–20)
CHLORIDE: 106 mmol/L (ref 101–111)
CO2: 26 mmol/L (ref 22–32)
Calcium: 8.9 mg/dL (ref 8.9–10.3)
Creatinine, Ser: 2.18 mg/dL — ABNORMAL HIGH (ref 0.44–1.00)
GFR calc non Af Amer: 25 mL/min — ABNORMAL LOW (ref >60)
GFR, EST AFRICAN AMERICAN: 29 mL/min — AB (ref >60)
Glucose, Bld: 62 mg/dL — ABNORMAL LOW (ref 65–99)
POTASSIUM: 3.7 mmol/L (ref 3.5–5.1)
Sodium: 140 mmol/L (ref 135–145)

## 2015-12-17 LAB — GLUCOSE, CAPILLARY
GLUCOSE-CAPILLARY: 107 mg/dL — AB (ref 65–99)
GLUCOSE-CAPILLARY: 180 mg/dL — AB (ref 65–99)
GLUCOSE-CAPILLARY: 301 mg/dL — AB (ref 65–99)
Glucose-Capillary: 227 mg/dL — ABNORMAL HIGH (ref 65–99)

## 2015-12-17 LAB — CBC
HEMATOCRIT: 28.9 % — AB (ref 36.0–46.0)
HEMOGLOBIN: 9.9 g/dL — AB (ref 12.0–15.0)
MCH: 31.5 pg (ref 26.0–34.0)
MCHC: 34.3 g/dL (ref 30.0–36.0)
MCV: 92 fL (ref 78.0–100.0)
Platelets: 170 10*3/uL (ref 150–400)
RBC: 3.14 MIL/uL — AB (ref 3.87–5.11)
RDW: 14.5 % (ref 11.5–15.5)
WBC: 8.6 10*3/uL (ref 4.0–10.5)

## 2015-12-17 LAB — HEMOGLOBIN A1C
HEMOGLOBIN A1C: 11.1 % — AB (ref 4.8–5.6)
MEAN PLASMA GLUCOSE: 272 mg/dL

## 2015-12-17 MED ORDER — FAMOTIDINE 20 MG PO TABS: 20.0000 mg | ORAL_TABLET | Freq: Two times a day (BID) | ORAL | Status: DC

## 2015-12-17 MED ORDER — GI COCKTAIL ~~LOC~~: 30.0000 mL | Freq: Three times a day (TID) | ORAL | Status: DC | PRN

## 2015-12-17 MED ORDER — LORAZEPAM 1 MG PO TABS: 1.0000 mg | ORAL_TABLET | Freq: Three times a day (TID) | ORAL | Status: DC

## 2015-12-17 MED ORDER — GI COCKTAIL ~~LOC~~
30.0000 mL | ORAL | Status: AC
  Administered 2015-12-17: 30 mL via ORAL
  Filled 2015-12-17: qty 30

## 2015-12-17 MED ORDER — INSULIN GLARGINE 100 UNIT/ML ~~LOC~~ SOLN: 11.0000 [IU] | Freq: Every day | SUBCUTANEOUS | Status: DC

## 2015-12-17 MED ORDER — LORAZEPAM 0.5 MG PO TABS: 0.5000 mg | ORAL_TABLET | Freq: Three times a day (TID) | ORAL | Status: DC | PRN

## 2015-12-17 MED ORDER — INSULIN ASPART 100 UNIT/ML ~~LOC~~ SOLN
0.0000 [IU] | Freq: Three times a day (TID) | SUBCUTANEOUS | Status: DC
  Administered 2015-12-17: 5 [IU] via SUBCUTANEOUS

## 2015-12-17 MED ORDER — HEPARIN SOD (PORK) LOCK FLUSH 100 UNIT/ML IV SOLN: 500.0000 [IU] | INTRAVENOUS | Status: DC | PRN

## 2015-12-17 NOTE — Care Management Note (Signed)
Case Management Note  Patient Details  Name: [name] [name] MRN: [id number] Date of Birth: 1966/01/03  Subjective/Objective:   Patient admitted with abdominal pain and AKI. Patient is from home alone.                 Action/Plan: Plan is to discharge home with self care. No further needs per CM.  Expected Discharge Date:                  Expected Discharge Plan:  Home/Self Care  In-House Referral:     Discharge planning Services     Post Acute Care Choice:    Choice offered to:     DME Arranged:    DME Agency:     HH Arranged:    [place] Agency:     Status of Service:  Completed, signed off  Medicare Important Message Given:    Date Medicare IM Given:    Medicare IM give by:    Date Additional Medicare IM Given:    Additional Medicare Important Message give by:     If discussed at [place] of Stay Meetings, dates discussed:    Additional Comments:  Pollie Friar, RN 12/17/2015, 1:58 PM

## 2015-12-17 NOTE — Progress Notes (Signed)
Inpatient Diabetes Program Recommendations  AACE/ADA: New Consensus Statement on Inpatient Glycemic Control (2015)  Target Ranges:  Prepandial:   less than 140 mg/dL      Peak postprandial:   less than 180 mg/dL (1-2 hours)      Critically ill patients:  140 - 180 mg/dL   Results for JENSY, MCKOY (MRN [id number]) as of 12/17/2015 08:48  Ref. Range 12/16/2015 22:33 12/17/2015 00:02 12/17/2015 04:11 12/17/2015 07:41  Glucose-Capillary Latest Ref Range: 65-99 mg/dL 250 (H) 301 (H) 180 (H) 107 (H)   Review of Glycemic Control  Diabetes history: DM 2 Outpatient Diabetes medications: Lantus 22 units Daily Current orders for Inpatient glycemic control: Novolog Moderate Q 4hrs.  Inpatient Diabetes Program Recommendations: Insulin - Basal: Patient takes Lantus 22 units Daily at home.  Patient's glucose on admission was >400. Last night glucose was in the 300's, pt received 11 units of Novolog to bring her down.  Please consider ordering part of patient's home dose of basal insulin, Lantus 16 units Q24hr. If patient is eating change Novolog Correction to ACHS.  Note patient had hypoglycemia of 30 due to the amount of Novolog given before (Novolog 13 units).  Thanks,  Tama Headings RN, MSN, [facility] Inpatient Diabetes Coordinator Team Pager [phone] (8a-5p)

## 2015-12-17 NOTE — Discharge Summary (Signed)
[facility] Discharge Summary  Patient name: [name] [name] Medical record number: [id number] Date of birth: 1966/02/02 Age: 49 y.o. Gender: female Date of Admission: 12/16/2015  Date of Discharge: 12/17/2015  Admitting Physician: Blane Ohara McDiarmid, MD  Primary Care Provider: Phill Myron, MD Consultants: none  Indication for Hospitalization: intractable N/V and abd pain  Discharge Diagnoses/Problem List:  Functional cyclic vomiting Idiopathic gastroparesis T2 DM Acute on chronic kidney disease stage III Hypertension  Disposition: home  Discharge Condition: stable  Discharge Exam:   Filed Vitals:   12/17/15 0657 12/17/15 0913  BP: 134/62 155/70  Pulse: 73 70  Temp: 99.3 F (37.4 C) 99.4 F (37.4 C)  Resp: 18 18   General: resting in bed, NAD Eyes: PERRL, EOMI ENTM: MMM Cardiovascular: RRR, S1, S2, no m/r/g Respiratory: CTAB, no wheezes or crackles. Good air movement. Abdomen: Diffusely TTP, mostly in epigastric region and RUQ without guarding or rebound, +BS, soft MSK: no edema Skin: No rashes or lesions, warm and dry, skin intact Neuro: AOx3, non-focal Psych: mood and affect appropriate  Brief Hospital Course:  [name] [name] is a 49 y.o. female presenting with nausea and vomiting. PMH is significant for cyclic vomiting syndrome, gastroparesis, marijuana abuse, depression, DM, HTN, CKD III.  Patient with long-standing history of cyclic vomiting associated with marijuana use, endorsing use of THC 2 days prior to admission. Current episode consistent with past layers. Lipase normal on admission. No concern for active bleed at time of admission, though patient did report she saw a tiny amount of blood with 1 episode of emesis. Hemoglobin stable throughout admission. Patient also with diffuse tenderness of abdomen, especially RUQ and epigastric region. Patient was admitted in observation overnight area diet was slowly advanced. Patient was  given maintenance IV fluids overnight until able to tolerate by mouth intake. Home Phenergan was continued, and Ativan was started as needed for nausea vomiting and anxiety. Home oxycodone and Tylenol were used as needed for pain control. Patient reported GERD-like symptoms on admission and inability to tolerate PPIs due to nausea, so Pepcid was started.  Patient initially with hyperglycemia to 425 on admission and urine with greater than 1000 glucose. A1c 11.1 on admission. After 5 units of NovoLog was administered, patient with hypoglycemic event to 30. Long-acting insulin held throughout admission, and half of home dose of Lantus resumed on discharge when tolerating by mouth well.  Creatinine elevated to 2.5 on admission, with baseline creatinine of 1.8-2. Thought to be prerenal etiology in the setting of dehydration from nausea and vomiting. Ketones and hyaline casts present urine support this as well. Home lisinopril was held and other nephrotoxic agents were avoided. Patient was treated with fluids and creatinine improved close to baseline prior to discharge.  All other chronic conditions stable throughout admission and managed with home regimens.  Issues for Follow Up:  1. F/u BP - holding lisinopril in setting of AKI 2.  F/u CBGs - hypoglycemic during hospitalization and discharged on half home Lantus dose 3. F/u Cr to ensure resolution of AKI 4. F/u N/V - started ativan prn for severe cyclic vomiting episodes  Significant Procedures: none  Significant Labs and Imaging:   Recent Labs Lab 12/16/15 0957 12/16/15 1750 12/17/15 0633  WBC 12.6* 9.7 8.6  HGB 11.8* 11.2* 9.9*  HCT 34.5* 32.4* 28.9*  PLT 214 174 170    Recent Labs Lab 12/16/15 0957 12/16/15 1750 12/17/15 0633  NA 140  --  140  K 3.8  --  3.7  CL 99*  --  106  CO2 22  --  26  GLUCOSE 425*  --  62*  BUN 40*  --  33*  CREATININE 2.50* 2.16* 2.18*  CALCIUM 10.3  --  8.9  ALKPHOS 95  --   --   AST 22  --   --    ALT 14  --   --   ALBUMIN 4.7  --   --     Lipase 31  Urinalysis    Component Value Date/Time   COLORURINE YELLOW 12/16/2015 1231   APPEARANCEUR CLEAR 12/16/2015 1231   LABSPEC 1.019 12/16/2015 1231   PHURINE 5.5 12/16/2015 1231   GLUCOSEU >1000* 12/16/2015 1231   HGBUR SMALL* 12/16/2015 1231   HGBUR small 10/19/2010 0852   BILIRUBINUR NEGATIVE 12/16/2015 1231   BILIRUBINUR NEG 12/04/2015 1017   KETONESUR 15* 12/16/2015 1231   PROTEINUR 100* 12/16/2015 1231   PROTEINUR 30 12/04/2015 1017   UROBILINOGEN 0.2 12/04/2015 1017   UROBILINOGEN 0.2 09/26/2015 1031   NITRITE NEGATIVE 12/16/2015 1231   NITRITE NEG 12/04/2015 1017   LEUKOCYTESUR NEGATIVE 12/16/2015 1231     Results/Tests Pending at Time of Discharge: none  Discharge Medications:    Medication List    STOP taking these medications        HYDROmorphone 2 MG tablet  Commonly known as:  DILAUDID     lisinopril 40 MG tablet  Commonly known as:  PRINIVIL,ZESTRIL     predniSONE 10 MG tablet  Commonly known as:  DELTASONE      TAKE these medications        aspirin EC 81 MG tablet  Take 1 tablet (81 mg total) by mouth daily.     blood glucose meter kit and supplies Kit  Dispense based on patient and insurance preference. Use up to four times daily as directed. (FOR ICD-9 250.00, 250.01).     carvedilol 25 MG tablet  Commonly known as:  COREG  Take 1 tablet (25 mg total) by mouth 2 (two) times daily with a meal.     cetirizine 10 MG tablet  Commonly known as:  ZYRTEC  Take 1 tablet (10 mg total) by mouth daily.     docusate sodium 100 MG capsule  Commonly known as:  COLACE  Take 1 capsule (100 mg total) by mouth 2 (two) times daily.     famotidine 20 MG tablet  Commonly known as:  PEPCID  Take 1 tablet (20 mg total) by mouth 2 (two) times daily.     hydrALAZINE 10 MG tablet  Commonly known as:  APRESOLINE  Take 1 tablet (10 mg total) by mouth 3 (three) times daily.     insulin glargine 100  UNIT/ML injection  Commonly known as:  LANTUS  Inject 0.11 mLs (11 Units total) into the skin daily.     lidocaine 2 % solution  Commonly known as:  XYLOCAINE  Use as directed 20 mLs in the mouth or throat every 3 (three) hours as needed for mouth pain.     LORazepam 0.5 MG tablet  Commonly known as:  ATIVAN  Take 1 tablet (0.5 mg total) by mouth every 8 (eight) hours as needed (severe nausea, vomiting).     oxyCODONE 5 MG immediate release tablet  Commonly known as:  ROXICODONE  Take 1 tablet (5 mg total) by mouth every 4 (four) hours as needed for severe pain.     polyethylene glycol packet  Commonly known as:  MIRALAX / GLYCOLAX  Take 17 g by mouth daily as needed. May increase in 2-3 days to 2 times daily, Until stooling regularly     pregabalin 25 MG capsule  Commonly known as:  LYRICA  TAKE 1 TABLET BY MOUTH AT BEDTIME AS NEEDED     promethazine 25 MG tablet  Commonly known as:  PHENERGAN  Take 1 tablet (25 mg total) by mouth every 6 (six) hours as needed for nausea or vomiting.        Discharge Instructions: Please refer to Patient Instructions section of EMR for full details.  Patient was counseled important signs and symptoms that should prompt return to medical care, changes in medications, dietary instructions, activity restrictions, and follow up appointments.   Follow-Up Appointments: Follow-up Information    Follow up with Marina Goodell, MD On 12/30/2015.   Specialty:  Family Medicine   Why:  Appointment at 1:30 , For hospital follow-up   Contact information:   [id number] [id number] [phone]       Virginia Crews, MD 12/17/2015, 11:40 AM PGY-2, [place]

## 2015-12-17 NOTE — Progress Notes (Signed)
Patient is d/c home, d/c instructions given and patient verbalized understanding.

## 2015-12-17 NOTE — Discharge Instructions (Signed)
You were admitted for vomiting.  You can continue to take your nausea medications at home for vomiting.  You can also take ativan as needed.  Continue home Oxycodone for pain control.  Hold your lisinopril until you follow-up in clinic.  Restart half of home dose of Lantus (11 units daily) when you are eating well.  Nausea and Vomiting Nausea is a sick feeling that often comes before throwing up (vomiting). Vomiting is a reflex where stomach contents come out of your mouth. Vomiting can cause severe loss of body fluids (dehydration). Children and elderly adults can become dehydrated quickly, especially if they also have diarrhea. Nausea and vomiting are symptoms of a condition or disease. It is important to find the cause of your symptoms. CAUSES   Direct irritation of the stomach lining. This irritation can result from increased acid production (gastroesophageal reflux disease), infection, food poisoning, taking certain medicines (such as nonsteroidal anti-inflammatory drugs), alcohol use, or tobacco use.  Signals from the brain.These signals could be caused by a headache, heat exposure, an inner ear disturbance, increased pressure in the brain from injury, infection, a tumor, or a concussion, pain, emotional stimulus, or metabolic problems.  An obstruction in the gastrointestinal tract (bowel obstruction).  Illnesses such as diabetes, hepatitis, gallbladder problems, appendicitis, kidney problems, cancer, sepsis, atypical symptoms of a heart attack, or eating disorders.  Medical treatments such as chemotherapy and radiation.  Receiving medicine that makes you sleep (general anesthetic) during surgery. DIAGNOSIS Your caregiver may ask for tests to be done if the problems do not improve after a few days. Tests may also be done if symptoms are severe or if the reason for the nausea and vomiting is not clear. Tests may include:  Urine tests.  Blood tests.  Stool tests.  Cultures (to look  for evidence of infection).  X-rays or other imaging studies. Test results can help your caregiver make decisions about treatment or the need for additional tests. TREATMENT You need to stay well hydrated. Drink frequently but in small amounts.You may wish to drink water, sports drinks, clear broth, or eat frozen ice pops or gelatin dessert to help stay hydrated.When you eat, eating slowly may help prevent nausea.There are also some antinausea medicines that may help prevent nausea. HOME CARE INSTRUCTIONS   Take all medicine as directed by your caregiver.  If you do not have an appetite, do not force yourself to eat. However, you must continue to drink fluids.  If you have an appetite, eat a normal diet unless your caregiver tells you differently.  Eat a variety of complex carbohydrates (rice, wheat, potatoes, bread), lean meats, yogurt, fruits, and vegetables.  Avoid high-fat foods because they are more difficult to digest.  Drink enough water and fluids to keep your urine clear or pale yellow.  If you are dehydrated, ask your caregiver for specific rehydration instructions. Signs of dehydration may include:  Severe thirst.  Dry lips and mouth.  Dizziness.  Dark urine.  Decreasing urine frequency and amount.  Confusion.  Rapid breathing or pulse. SEEK IMMEDIATE MEDICAL CARE IF:   You have blood or brown flecks (like coffee grounds) in your vomit.  You have black or bloody stools.  You have a severe headache or stiff neck.  You are confused.  You have severe abdominal pain.  You have chest pain or trouble breathing.  You do not urinate at least once every 8 hours.  You develop cold or clammy skin.  You continue to vomit for  longer than 24 to 48 hours.  You have a fever. MAKE SURE YOU:   Understand these instructions.  Will watch your condition.  Will get help right away if you are not doing well or get worse.   This information is not intended to  replace advice given to you by your health care provider. Make sure you discuss any questions you have with your health care provider.   Document Released: 12/13/2005 Document Revised: 03/06/2012 Document Reviewed: 05/12/2011 Elsevier Interactive Patient Education [employer].

## 2015-12-30 ENCOUNTER — Encounter: Payer: Self-pay | Admitting: Student

## 2015-12-30 ENCOUNTER — Ambulatory Visit (INDEPENDENT_AMBULATORY_CARE_PROVIDER_SITE_OTHER): Payer: Medicare Other | Admitting: Student

## 2015-12-30 VITALS — BP 120/59 | HR 58 | Temp 98.5°F | Resp 16 | Wt 140.5 lb

## 2015-12-30 DIAGNOSIS — E1322 Other specified diabetes mellitus with diabetic chronic kidney disease: Secondary | ICD-10-CM | POA: Diagnosis not present

## 2015-12-30 DIAGNOSIS — R457 State of emotional shock and stress, unspecified: Secondary | ICD-10-CM | POA: Diagnosis not present

## 2015-12-30 DIAGNOSIS — I10 Essential (primary) hypertension: Secondary | ICD-10-CM | POA: Diagnosis not present

## 2015-12-30 DIAGNOSIS — N183 Chronic kidney disease, stage 3 (moderate): Secondary | ICD-10-CM

## 2015-12-30 DIAGNOSIS — N179 Acute kidney failure, unspecified: Secondary | ICD-10-CM | POA: Diagnosis not present

## 2015-12-30 DIAGNOSIS — E1365 Other specified diabetes mellitus with hyperglycemia: Secondary | ICD-10-CM

## 2015-12-30 DIAGNOSIS — IMO0002 Reserved for concepts with insufficient information to code with codable children: Secondary | ICD-10-CM

## 2015-12-30 HISTORY — DX: State of emotional shock and stress, unspecified: R45.7

## 2015-12-30 LAB — GLUCOSE, CAPILLARY: GLUCOSE-CAPILLARY: 111 mg/dL — AB (ref 65–99)

## 2015-12-30 NOTE — Assessment & Plan Note (Signed)
Appropriate stress reaction after a break up with partner - pt encouraged to obtain exercise to help with sleep as well a low mood as well as reach out to family who have been supportive to her in past - Will continue follow closely - will follow up in 2 weeks to continue to discuss any issues

## 2015-12-30 NOTE — Assessment & Plan Note (Signed)
Continue coreg, hydralazine as well as lisinopril for now. - BMP collected today, if Cr elevated will stop Lisinopril - continue to monitor

## 2015-12-30 NOTE — Patient Instructions (Addendum)
Follow up in 2 weeks for mood and blood pressure Please call the office if you have any questions or concerns If your blood sugars are persistently elevated, please call the office to discuss restarting your old Lantus regimen If you have questions or concerns, please call the office at [phone]

## 2015-12-30 NOTE — Assessment & Plan Note (Signed)
Continued to take lisinopril despite it being held after discharge - will collect BMP today to assess renal function - If Cr elevated will ask her to stop lisinopril

## 2015-12-30 NOTE — Progress Notes (Signed)
Subjective:    Patient ID: CHI CRISE, female    DOB: 09/06/1966, 50 y.o.   MRN: [id number]   CC: Hospital follow up  HPI 50 y/o F with PMH of cyclic vomiting presents for hospital follow up for admission for nausea/vomitting. She was also noted to have an AKI as well as hypoglycemia while admitted. She was discharged on half of her original Lantus dose (11 units) and her lisinopril was held on discharge due to her AKI  While being checked in, she became tearful and noted her long term partner broke up with her on 12/31  Recent break up - She states that the break up was not mutual and she is unsure why he wanted to end things with her - since the break up, she has been sad, has had decreased appetite and has had difficulty sleeping - to cope she states she watches TV - she has continued to smoke marijuana .5 joint per day -while this has been denies SI/HI  DM - Has been maintained on Lantus 11 u at home  - home CBGs have been in the 60s-220s. She has been to the 60s in the AM typicaly when she has not eaten much - However, she has not been checking her blood sugars every day - she has been eating less since breaking up with her boyfriend as noted above  Htn - Lisinopril held at discharge on 12/21 for AKI, however, pt kept taking it at home in addition to hydralazine and coreg - she had a headache once over the weekend (12/31) and felt as if her blood pressure was high.  -She went to a drug store to check her BP and it was 190s/90s per report. She took her lisinopril but did not take any other medications for headache. She went to sleep and when she awoke, her headache had resolved -   Cyclic vomiting thought to be due to marijuana use - She was given ativan to help with nausea. She has some nausea but the ativan helped - As above she continues to smoke marijuana  She other wise denies fever, chest pain or SOB  Review of Systems   See HPI for ROS.   Past Medical History   Diagnosis Date  . PANCREATITIS 05/09/2008  . Cyclic vomiting syndrome   . Hypertension   . Complication of anesthesia     "problems waking up"  . Heart murmur   . Myocardial infarction [facility]) 07/2007    "they say I've had a silent one; I don't know"  . Pneumonia   . Anemia   . Blood transfusion   . GERD (gastroesophageal reflux disease)   . Anxiety   . Depression   . Smoker   . Marijuana smoker, continuous (HCC)   . NEUROPATHY, DIABETIC 02/23/2007  . Type II diabetes mellitus (HCC)   . Diabetic gastroparesis (HCC)     /e-chart  . Chronic kidney disease (CKD), stage III (moderate)   . Nausea & vomiting 06/10/2015  . Intestinal impaction [facility])    Past Surgical History  Procedure Laterality Date  . Port-a-cath placement  ~ 2008    right chest; "poor access; frequent hospitalizations"  . Laparotomy and lysis of adhesions    . Abdominal hysterectomy  02/2002  . Cholecystectomy  1980's  . Cardiac catheterization    . Dilation and evacuation  X3  . Left hand surgery    . Cataract extraction w/ intraocular lens  implant, bilateral    .  Colonoscopy    . Upper gi endoscopy    . Cesarean section     OB History    Gravida Para Term Preterm AB TAB SAB Ectopic Multiple Living   7    3 3    4      Social History   Social History  . Marital Status: Single    Spouse Name: N/A  . Number of Children: N/A  . Years of Education: N/A   Occupational History  . Not on file.   Social History Main Topics  . Smoking status: Current Some Day Smoker -- 0.50 packs/day for 22 years    Types: Cigarettes  . Smokeless tobacco: Never Used  . Alcohol Use: No  . Drug Use: Yes    Special: Marijuana     Comment: Current Daily to weekly use  . Sexual Activity: Yes    Birth Control/ Protection: None   Other Topics Concern  . Not on file   Social History Narrative   Lives in [place] [address] - some minimal family support   Has 10 grandchildren   Cares for her Dog Ander Gaster (Mixed female [place] and  Cockerspaniel)     Objective:  BP 120/59 mmHg  Pulse 58  Temp(Src) 98.5 F (36.9 C) (Oral)  Resp 16  Wt 140 lb 8 oz (63.73 kg) Vitals and nursing note reviewed  General: NAD, intermittently tearful during interview Cardiac: RRR,  Respiratory: CTAB, normal effort Abdomen: soft, nontender, nondistended,    Assessment & Plan:    AKI (acute kidney injury) (HCC) Continued to take lisinopril despite it being held after discharge - will collect BMP today to assess renal function - If Cr elevated will ask her to stop lisinopril  Uncontrolled secondary diabetes mellitus with stage 3 CKD (GFR 30-59) Hypoglycemia during recent hospitalization prompted decrease of Lantus from 22 units qD to 11 qD. Given recent emotional stress with decreased appetite there is concern about increasing lantus. CBG 111 in the office - Will continue with lantus 11 qD . Pt encouraged top check her blood sugars more regularly - Will follow in 2 weeks to discuss DM regimen as well as home blood glucoses  Essential hypertension, malignant Continue coreg, hydralazine as well as lisinopril for now. - BMP collected today, if Cr elevated will stop Lisinopril - continue to monitor  Emotional stress Appropriate stress reaction after a break up with partner - pt encouraged to obtain exercise to help with sleep as well a low mood as well as reach out to family who have been supportive to her in past - Will continue follow closely - will follow up in 2 weeks to continue to discuss any issues     Griffith Santilli A. Kennon Rounds MD, MS Family Medicine Resident PGY-2 Pager [phone]

## 2015-12-30 NOTE — Assessment & Plan Note (Signed)
Hypoglycemia during recent hospitalization prompted decrease of Lantus from 22 units qD to 11 qD. Given recent emotional stress with decreased appetite there is concern about increasing lantus. CBG 111 in the office - Will continue with lantus 11 qD . Pt encouraged top check her blood sugars more regularly - Will follow in 2 weeks to discuss DM regimen as well as home blood glucoses

## 2015-12-31 LAB — BASIC METABOLIC PANEL
BUN: 22 mg/dL (ref 7–25)
CALCIUM: 8.9 mg/dL (ref 8.6–10.4)
CO2: 24 mmol/L (ref 20–31)
Chloride: 106 mmol/L (ref 98–110)
Creat: 1.99 mg/dL — ABNORMAL HIGH (ref 0.50–1.05)
GLUCOSE: 105 mg/dL — AB (ref 65–99)
POTASSIUM: 4.8 mmol/L (ref 3.5–5.3)
SODIUM: 138 mmol/L (ref 135–146)

## 2016-01-14 ENCOUNTER — Ambulatory Visit (INDEPENDENT_AMBULATORY_CARE_PROVIDER_SITE_OTHER): Payer: Medicare Other | Admitting: Family Medicine

## 2016-01-14 ENCOUNTER — Encounter: Payer: Self-pay | Admitting: Family Medicine

## 2016-01-14 VITALS — BP 144/56 | HR 59 | Temp 98.1°F | Wt 151.0 lb

## 2016-01-14 DIAGNOSIS — N183 Chronic kidney disease, stage 3 (moderate): Secondary | ICD-10-CM

## 2016-01-14 DIAGNOSIS — E1322 Other specified diabetes mellitus with diabetic chronic kidney disease: Secondary | ICD-10-CM

## 2016-01-14 DIAGNOSIS — E1365 Other specified diabetes mellitus with hyperglycemia: Secondary | ICD-10-CM

## 2016-01-14 DIAGNOSIS — F418 Other specified anxiety disorders: Secondary | ICD-10-CM

## 2016-01-14 DIAGNOSIS — I10 Essential (primary) hypertension: Secondary | ICD-10-CM | POA: Diagnosis not present

## 2016-01-14 DIAGNOSIS — E1149 Type 2 diabetes mellitus with other diabetic neurological complication: Secondary | ICD-10-CM

## 2016-01-14 DIAGNOSIS — IMO0002 Reserved for concepts with insufficient information to code with codable children: Secondary | ICD-10-CM

## 2016-01-14 MED ORDER — PREGABALIN 50 MG PO CAPS: ORAL_CAPSULE | ORAL | Status: DC

## 2016-01-14 MED ORDER — CARVEDILOL 25 MG PO TABS: 25.0000 mg | ORAL_TABLET | Freq: Two times a day (BID) | ORAL | Status: DC

## 2016-01-14 NOTE — Assessment & Plan Note (Signed)
Was taking Lyrica 25 mg qd prn (3-4 times a week).  - Advised taking Lyrica 25mg  daily and titrating up to 50mg  qd.

## 2016-01-14 NOTE — Assessment & Plan Note (Signed)
Reports improved mood today and not wanting to take medications.  - Again discussed TCAs and SSRI, but she declined - Recommend Psychiatry, but she declines. "I don't want to have to tell my story to someone else."

## 2016-01-14 NOTE — Assessment & Plan Note (Signed)
Poorly controlled DM with neuropathy and gastroparesis.  - Advised to take meter to pharmacy to see if they can help get it working. If not she is to call and have Rx for new meter.  - Advised checking qam - f/u in 2-3 weeks for lantus titration

## 2016-01-14 NOTE — Progress Notes (Signed)
  Patient name: [name] [name] MRN [id number]  Date of birth: Apr 27, 1966  CC & HPI:  [name] [name] is a 50 y.o. female presenting today for HTN, DM and mood.   Mood - she reports stable mood today and denies feeling down/depressed or anxious - she reports not "wanting to take medicaitons"  HTN - reports taking Coreg and lisinopril but says she has not been taking Hydralazine - denies CP, SOB but report some dizziness with standing too fast   DM - Not check BGs. Reports meter not working - Reports some dizziness and intermittent neuropathy - Taking Lantus 11u qd  ROS: See HPI   Medical & Surgical Hx:  Reviewed Medications & Allergies: Reviewed  Social History: Reviewed:   Objective Findings:  Vitals: BP 144/56 mmHg  Pulse 59  Temp(Src) 98.1 F (36.7 C) (Oral)  Wt 151 lb (68.493 kg)  Gen: NAD CV: RRR w/o m/r/g, pulses +2 b/l Resp: CTAB w/ normal respiratory effort  Assessment & Plan:   Essential hypertension, malignant BP well controlled today - Continue Coreg 25 mg BID - Continue Lisinopril 40mg  qd. She continued to take this since last discharge although she was told to hold until followed up with PCP. Advised to hold during her vomiting spells or of times of dehydration - Discontinue Hydralazine. She report not taking since her last hospital discharge - f/u in 2-4 weeks. Will like recheck Cr at that time  DM neuropathies (HCC) Was taking Lyrica 25 mg qd prn (3-4 times a week).  - Advised taking Lyrica 25mg  daily and titrating up to 50mg  qd.   Uncontrolled secondary diabetes mellitus with stage 3 CKD (GFR 30-59) Poorly controlled DM with neuropathy and gastroparesis.  - Advised to take meter to pharmacy to see if they can help get it working. If not she is to call and have Rx for new meter.  - Advised checking qam - f/u in 2-3 weeks for lantus titration  Depression with anxiety Reports improved mood today and not wanting to take medications.  - Again discussed  TCAs and SSRI, but she declined - Recommend Psychiatry, but she declines. "I don't want to have to tell my story to someone else."

## 2016-01-14 NOTE — Patient Instructions (Signed)
It was great seeing you today.   1. Start taking Lyrica 25-50 mg every night.  2. Once she get your meter fixed checked your blood sugar first thing in the morning before eating or taking medications.  3. Bring her meter and his blood sugar readings to next appointmentPlease bring all your medications to every doctors visit  Sign up for My Chart to have easy access to your labs results, and communication with your Primary care physician.  Next Appointment  Please make an appointment with Dr Berkley Harvey in 2-3 weeks   I look forward to talking with you again at our next visit. If you have any questions or concerns before then, please call the clinic at [phone].  Take Care,   Dr Phill Myron

## 2016-01-14 NOTE — Assessment & Plan Note (Addendum)
BP well controlled today - Continue Coreg 25 mg BID - Continue Lisinopril 40mg  qd. She continued to take this since last discharge although she was told to hold until followed up with PCP. Advised to hold during her vomiting spells or of times of dehydration - Discontinue Hydralazine. She report not taking since her last hospital discharge - f/u in 2-4 weeks. Will like recheck Cr at that time

## 2016-01-20 ENCOUNTER — Encounter (HOSPITAL_COMMUNITY): Payer: Self-pay | Admitting: Emergency Medicine

## 2016-01-20 ENCOUNTER — Inpatient Hospital Stay (HOSPITAL_COMMUNITY)
Admission: EM | Admit: 2016-01-20 | Discharge: 2016-01-21 | DRG: 103 | Disposition: A | Discharge status: Home / Self Care | Payer: Medicare Other | Attending: Family Medicine | Admitting: Family Medicine

## 2016-01-20 DIAGNOSIS — N183 Chronic kidney disease, stage 3 (moderate): Secondary | ICD-10-CM | POA: Diagnosis present

## 2016-01-20 DIAGNOSIS — G43A0 Cyclical vomiting, not intractable: Secondary | ICD-10-CM | POA: Diagnosis not present

## 2016-01-20 DIAGNOSIS — E1122 Type 2 diabetes mellitus with diabetic chronic kidney disease: Secondary | ICD-10-CM | POA: Diagnosis not present

## 2016-01-20 DIAGNOSIS — Z91030 Bee allergy status: Secondary | ICD-10-CM | POA: Diagnosis not present

## 2016-01-20 DIAGNOSIS — F411 Generalized anxiety disorder: Secondary | ICD-10-CM | POA: Diagnosis present

## 2016-01-20 DIAGNOSIS — Z79891 Long term (current) use of opiate analgesic: Secondary | ICD-10-CM | POA: Diagnosis not present

## 2016-01-20 DIAGNOSIS — Z886 Allergy status to analgesic agent status: Secondary | ICD-10-CM | POA: Diagnosis not present

## 2016-01-20 DIAGNOSIS — F1210 Cannabis abuse, uncomplicated: Secondary | ICD-10-CM | POA: Diagnosis present

## 2016-01-20 DIAGNOSIS — F17210 Nicotine dependence, cigarettes, uncomplicated: Secondary | ICD-10-CM | POA: Diagnosis present

## 2016-01-20 DIAGNOSIS — R1115 Cyclical vomiting syndrome unrelated to migraine: Secondary | ICD-10-CM | POA: Insufficient documentation

## 2016-01-20 DIAGNOSIS — R110 Nausea: Secondary | ICD-10-CM | POA: Diagnosis not present

## 2016-01-20 DIAGNOSIS — Z7982 Long term (current) use of aspirin: Secondary | ICD-10-CM | POA: Diagnosis not present

## 2016-01-20 DIAGNOSIS — E1165 Type 2 diabetes mellitus with hyperglycemia: Secondary | ICD-10-CM | POA: Diagnosis not present

## 2016-01-20 DIAGNOSIS — K219 Gastro-esophageal reflux disease without esophagitis: Secondary | ICD-10-CM | POA: Diagnosis not present

## 2016-01-20 DIAGNOSIS — R1011 Right upper quadrant pain: Secondary | ICD-10-CM | POA: Diagnosis not present

## 2016-01-20 DIAGNOSIS — Z794 Long term (current) use of insulin: Secondary | ICD-10-CM

## 2016-01-20 DIAGNOSIS — Z881 Allergy status to other antibiotic agents status: Secondary | ICD-10-CM | POA: Diagnosis not present

## 2016-01-20 DIAGNOSIS — F329 Major depressive disorder, single episode, unspecified: Secondary | ICD-10-CM | POA: Diagnosis present

## 2016-01-20 DIAGNOSIS — I252 Old myocardial infarction: Secondary | ICD-10-CM

## 2016-01-20 DIAGNOSIS — Z888 Allergy status to other drugs, medicaments and biological substances status: Secondary | ICD-10-CM | POA: Diagnosis not present

## 2016-01-20 DIAGNOSIS — Z79899 Other long term (current) drug therapy: Secondary | ICD-10-CM | POA: Diagnosis not present

## 2016-01-20 DIAGNOSIS — R1013 Epigastric pain: Secondary | ICD-10-CM | POA: Diagnosis not present

## 2016-01-20 DIAGNOSIS — I129 Hypertensive chronic kidney disease with stage 1 through stage 4 chronic kidney disease, or unspecified chronic kidney disease: Secondary | ICD-10-CM | POA: Diagnosis not present

## 2016-01-20 LAB — COMPREHENSIVE METABOLIC PANEL
ALBUMIN: 4.2 g/dL (ref 3.5–5.0)
ALK PHOS: 75 U/L (ref 38–126)
ALT: 14 U/L (ref 14–54)
AST: 20 U/L (ref 15–41)
Anion gap: 10 (ref 5–15)
BILIRUBIN TOTAL: 0.6 mg/dL (ref 0.3–1.2)
BUN: 26 mg/dL — AB (ref 6–20)
CALCIUM: 10.1 mg/dL (ref 8.9–10.3)
CO2: 22 mmol/L (ref 22–32)
Chloride: 111 mmol/L (ref 101–111)
Creatinine, Ser: 1.91 mg/dL — ABNORMAL HIGH (ref 0.44–1.00)
GFR calc Af Amer: 34 mL/min — ABNORMAL LOW (ref >60)
GFR calc non Af Amer: 30 mL/min — ABNORMAL LOW (ref >60)
GLUCOSE: 204 mg/dL — AB (ref 65–99)
Potassium: 4.6 mmol/L (ref 3.5–5.1)
Sodium: 143 mmol/L (ref 135–145)
TOTAL PROTEIN: 7.6 g/dL (ref 6.5–8.1)

## 2016-01-20 LAB — URINALYSIS, ROUTINE W REFLEX MICROSCOPIC
BILIRUBIN URINE: NEGATIVE
GLUCOSE, UA: 100 mg/dL — AB
KETONES UR: NEGATIVE mg/dL
Leukocytes, UA: NEGATIVE
NITRITE: NEGATIVE
PROTEIN: 30 mg/dL — AB
Specific Gravity, Urine: 1.012 (ref 1.005–1.030)
pH: 6.5 (ref 5.0–8.0)

## 2016-01-20 LAB — URINE MICROSCOPIC-ADD ON

## 2016-01-20 LAB — GLUCOSE, CAPILLARY
GLUCOSE-CAPILLARY: 286 mg/dL — AB (ref 65–99)
Glucose-Capillary: 187 mg/dL — ABNORMAL HIGH (ref 65–99)

## 2016-01-20 LAB — CBC
HEMATOCRIT: 36.7 % (ref 36.0–46.0)
Hemoglobin: 12.3 g/dL (ref 12.0–15.0)
MCH: 31 pg (ref 26.0–34.0)
MCHC: 33.5 g/dL (ref 30.0–36.0)
MCV: 92.4 fL (ref 78.0–100.0)
Platelets: 199 10*3/uL (ref 150–400)
RBC: 3.97 MIL/uL (ref 3.87–5.11)
RDW: 14.8 % (ref 11.5–15.5)
WBC: 7.1 10*3/uL (ref 4.0–10.5)

## 2016-01-20 LAB — I-STAT TROPONIN, ED: Troponin i, poc: 0.02 ng/mL (ref 0.00–0.08)

## 2016-01-20 LAB — I-STAT CHEM 8, ED
BUN: 30 mg/dL — AB (ref 6–20)
CALCIUM ION: 1.26 mmol/L — AB (ref 1.12–1.23)
CREATININE: 1.7 mg/dL — AB (ref 0.44–1.00)
Chloride: 109 mmol/L (ref 101–111)
Glucose, Bld: 198 mg/dL — ABNORMAL HIGH (ref 65–99)
HCT: 42 % (ref 36.0–46.0)
Hemoglobin: 14.3 g/dL (ref 12.0–15.0)
Potassium: 4.6 mmol/L (ref 3.5–5.1)
Sodium: 143 mmol/L (ref 135–145)
TCO2: 22 mmol/L (ref 0–100)

## 2016-01-20 LAB — RAPID URINE DRUG SCREEN, HOSP PERFORMED
Amphetamines: NOT DETECTED
BARBITURATES: NOT DETECTED
BENZODIAZEPINES: NOT DETECTED
COCAINE: NOT DETECTED
OPIATES: NOT DETECTED
TETRAHYDROCANNABINOL: POSITIVE — AB

## 2016-01-20 LAB — LIPASE, BLOOD: Lipase: 42 U/L (ref 11–51)

## 2016-01-20 MED ORDER — PROMETHAZINE HCL 25 MG/ML IJ SOLN
25.0000 mg | Freq: Once | INTRAMUSCULAR | Status: DC
  Filled 2016-01-20: qty 1

## 2016-01-20 MED ORDER — PROMETHAZINE HCL 25 MG/ML IJ SOLN
12.5000 mg | Freq: Four times a day (QID) | INTRAMUSCULAR | Status: DC | PRN
  Administered 2016-01-21: 12.5 mg via INTRAVENOUS
  Filled 2016-01-20 (×4): qty 1

## 2016-01-20 MED ORDER — PROMETHAZINE HCL 12.5 MG RE SUPP
12.5000 mg | Freq: Four times a day (QID) | RECTAL | Status: DC | PRN
  Filled 2016-01-20: qty 1

## 2016-01-20 MED ORDER — SODIUM CHLORIDE 0.9 % IV SOLN
INTRAVENOUS | Status: DC
  Administered 2016-01-20 – 2016-01-21 (×4): via INTRAVENOUS

## 2016-01-20 MED ORDER — HALOPERIDOL LACTATE 5 MG/ML IJ SOLN
2.5000 mg | Freq: Once | INTRAMUSCULAR | Status: AC
  Administered 2016-01-20: 2.5 mg via INTRAVENOUS
  Filled 2016-01-20: qty 1

## 2016-01-20 MED ORDER — PROMETHAZINE HCL 25 MG PO TABS: 25.0000 mg | ORAL_TABLET | Freq: Once | ORAL | Status: DC

## 2016-01-20 MED ORDER — HALOPERIDOL LACTATE 5 MG/ML IJ SOLN: 2.5000 mg | Freq: Once | INTRAMUSCULAR | Status: DC

## 2016-01-20 MED ORDER — MORPHINE SULFATE (PF) 4 MG/ML IV SOLN
4.0000 mg | Freq: Once | INTRAVENOUS | Status: AC
  Administered 2016-01-20: 4 mg via INTRAVENOUS
  Filled 2016-01-20: qty 1

## 2016-01-20 MED ORDER — HYDROMORPHONE HCL 1 MG/ML IJ SOLN
0.5000 mg | INTRAMUSCULAR | Status: DC | PRN
  Administered 2016-01-20 – 2016-01-21 (×3): 0.5 mg via INTRAVENOUS
  Filled 2016-01-20 (×4): qty 1

## 2016-01-20 MED ORDER — HYDRALAZINE HCL 20 MG/ML IJ SOLN: 5.0000 mg | INTRAMUSCULAR | Status: DC | PRN

## 2016-01-20 MED ORDER — LORAZEPAM 2 MG/ML IJ SOLN
1.0000 mg | Freq: Three times a day (TID) | INTRAMUSCULAR | Status: DC
Start: 2016-01-20 — End: 2016-01-21
  Administered 2016-01-20 – 2016-01-21 (×3): 1 mg via INTRAVENOUS
  Filled 2016-01-20 (×3): qty 1

## 2016-01-20 MED ORDER — INSULIN ASPART 100 UNIT/ML ~~LOC~~ SOLN
0.0000 [IU] | Freq: Three times a day (TID) | SUBCUTANEOUS | Status: DC
  Administered 2016-01-20: 5 [IU] via SUBCUTANEOUS
  Administered 2016-01-21: 3 [IU] via SUBCUTANEOUS

## 2016-01-20 MED ORDER — HYDRALAZINE HCL 20 MG/ML IJ SOLN
5.0000 mg | Freq: Once | INTRAMUSCULAR | Status: AC
  Administered 2016-01-20: 5 mg via INTRAVENOUS
  Filled 2016-01-20: qty 1

## 2016-01-20 MED ORDER — SODIUM CHLORIDE 0.9 % IV BOLUS (SEPSIS)
1000.0000 mL | Freq: Once | INTRAVENOUS | Status: AC
  Administered 2016-01-20: 1000 mL via INTRAVENOUS

## 2016-01-20 MED ORDER — PROMETHAZINE HCL 25 MG PO TABS: 12.5000 mg | ORAL_TABLET | Freq: Four times a day (QID) | ORAL | Status: DC | PRN

## 2016-01-20 MED ORDER — PROMETHAZINE HCL 25 MG/ML IJ SOLN
25.0000 mg | Freq: Once | INTRAMUSCULAR | Status: AC
  Administered 2016-01-20: 25 mg via INTRAMUSCULAR

## 2016-01-20 MED ORDER — INSULIN ASPART 100 UNIT/ML ~~LOC~~ SOLN: 0.0000 [IU] | Freq: Every day | SUBCUTANEOUS | Status: DC

## 2016-01-20 MED ORDER — MORPHINE SULFATE (PF) 4 MG/ML IV SOLN: 4.0000 mg | Freq: Once | INTRAVENOUS | Status: DC

## 2016-01-20 MED ORDER — HEPARIN SODIUM (PORCINE) 5000 UNIT/ML IJ SOLN
5000.0000 [IU] | Freq: Three times a day (TID) | INTRAMUSCULAR | Status: DC
  Administered 2016-01-20 – 2016-01-21 (×4): 5000 [IU] via SUBCUTANEOUS
  Filled 2016-01-20 (×4): qty 1

## 2016-01-20 NOTE — ED Notes (Signed)
Admitting at bedside 

## 2016-01-20 NOTE — ED Notes (Addendum)
C/o upper abd pain and vomiting since yesterday, diaphoretic and tearful in triage, no diarrhea, A/O X4, NAD - pt offered Zofran but declined

## 2016-01-20 NOTE — ED Notes (Signed)
Patient still actively vomiting no assess. MD aware pain medication still not given.

## 2016-01-20 NOTE — H&P (Signed)
Family Medicine Teaching [facility] Admission History and Physical Service Pager: [phone]  Patient name: [name] [name] Medical record number: [id number] Date of birth: 1966-06-21 Age: 50 y.o. Gender: female  Primary Care Provider: Wenda Low, MD Consultants: None  Code Status: Full  Chief Complaint: Nausea, vomiting, abdominal pain  Assessment and Plan: [name] [name] is a 50 y.o. female presenting with nausea, vomiting, and abdominal pain . PMH is significant for cyclic vomiting syndrome, gastroparesis, marijuana abuse, depression, DM, HTN, CKD III.   [name] [name] is a 50 y.o. female presenting with nausea and vomiting. PMH is significant for cyclic vomiting syndrome, gastroparesis, marijuana abuse, depression, DM, HTN, CKD III.   #Nausea/Vomiting with abdominal pain: Patient with longstanding history of cyclic vomiting associated with marijuana use (endorsed use of THC recently). Current episode consistent with past flares. Patient also with history of gastroparesis that is also contributing. Lipase normal. Hemoglobin normal; 12.3. No concern for active bleed currently. Abdominal exam with diffuse tenderness and guarding at RUQ and epigastric region. Doubt obstruction or other acute intra-abdominal pathology.  - Admit to med surg, attending Dr. McDiarmid - NPO   - mIVF NS 150 mL/hr while NPO - phenergan 12.5 mg q6hrs  IV - Consider starting reglan or other pro-motility agent.  - Dilaudid 0.5 mg Q4 PRN    #HTN: she has a history of hypertensive blood pressure during exacerbations of her cyclic vomiting. Most likely her blood pressure will return to normal when she is through with the acute episode.  - hydralazine 5 mg IV Q4 PRN - hold home carvedilol 25mg  bid, hydralazine 10mg  tid - Hold home lisinopril    #DM2 / Hyperglycemia:  Blood glucose 204 on admission.    - NPO, holding home lantus   -  SSI - CBG's    #CKD III: Cr 1.9 on admission, baseline ~2.   -  Hold home lisinopril - mIVF as above  FEN/GI: NS 150 mL/hr/ NPO  Prophylaxis: heparin subq .  Disposition: admitted to FMTS for nausea, vomiting and abdominal pain   History of Present Illness:  [name] [name] is a 50 y.o. female presenting with nausea, vomiting and abdominal pain. Patient's symptoms started 3 days ago and have gotten progressively worse. She is not been able to take anything by mouth today. She admits having marijuana use within the last few days. She denies any fevers, chest pain, shortness of breath, dysuria, diarrhea, hematochezia or hematemesis, or melena. She describes the pain as achy. The pain is not associated with food and is constant in nature. She does have some radiation to her back and is not associated with any positional change.  She is actively having emesis in the room while being interviewed. She was having some diaphoresis associated with emesis.  Review Of Systems: Per HPI with the following additions: See HPI  Otherwise the remainder of the systems were negative.  Patient Active Problem List   Diagnosis Date Noted  . Emotional stress 12/30/2015  . Hyperkalemia 11/28/2015  . Elevated cholesterol 11/28/2015  . AKI (acute kidney injury) (HCC)   . Absolute anemia   . Hard palate ulcer   . Oral ulcer 11/22/2015  . Tooth ache   . Cystitis   . Vomiting 11/15/2015  . Gastroparesis 08/21/2015  . Hematuria 07/09/2015  . Blood in stool 06/23/2015  . Constipation 06/06/2015  . Onychomycosis 03/25/2015  . Encounter for chronic pain management 01/06/2015  . Vitamin D deficiency 11/28/2014  . At risk for  polypharmacy 08/15/2014  . Other and unspecified hyperlipidemia 08/15/2014  . Leg cramping 07/18/2014  . Rotator cuff syndrome 04/19/2014  . Biceps tendinopathy of right upper extremity 04/05/2014  . Protein-calorie malnutrition, severe (HCC) 12/17/2013  . Anemia 11/15/2013  . CKD (chronic kidney disease) 08/17/2013  . Esophagitis 04/18/2013  .  Preventative health care 09/18/2012  . High risk social situation 08/31/2012  . Allergic rhinitis, seasonal 05/04/2012  . Marijuana smoker (HCC) 11/03/2011  . Uncontrolled secondary diabetes mellitus with stage 3 CKD (GFR 30-59) (HCC) 10/23/2011  . Cyclic vomiting syndrome 08/03/2011  . Hot flashes 07/29/2011  . Backache 04/10/2007  . Depression with anxiety 02/23/2007  . Anxiety state 02/23/2007  . TOBACCO DEPENDENCE 02/23/2007  . MIGRAINE, UNSPEC., W/O INTRACTABLE MIGRAINE 02/23/2007  . Essential hypertension, malignant 02/23/2007  . INSOMNIA NOS 02/23/2007  . DM neuropathies (HCC) 02/23/2007  . GERD 01/27/2004    Past Medical History: Past Medical History  Diagnosis Date  . PANCREATITIS 05/09/2008  . Cyclic vomiting syndrome   . Hypertension   . Complication of anesthesia     "problems waking up"  . Heart murmur   . Myocardial infarction [facility]) 07/2007    "they say I've had a silent one; I don't know"  . Pneumonia   . Anemia   . Blood transfusion   . GERD (gastroesophageal reflux disease)   . Anxiety   . Depression   . Smoker   . Marijuana smoker, continuous (HCC)   . NEUROPATHY, DIABETIC 02/23/2007  . Type II diabetes mellitus (HCC)   . Diabetic gastroparesis (HCC)     /e-chart  . Chronic kidney disease (CKD), stage III (moderate)   . Nausea & vomiting 06/10/2015  . Intestinal impaction [facility])     Past Surgical History: Past Surgical History  Procedure Laterality Date  . Port-a-cath placement  ~ 2008    right chest; "poor access; frequent hospitalizations"  . Laparotomy and lysis of adhesions    . Abdominal hysterectomy  02/2002  . Cholecystectomy  1980's  . Cardiac catheterization    . Dilation and evacuation  X3  . Left hand surgery    . Cataract extraction w/ intraocular lens  implant, bilateral    . Colonoscopy    . Upper gi endoscopy    . Cesarean section      Social History: Social History  Substance Use Topics  . Smoking status: Current Some Day  Smoker -- 0.50 packs/day for 22 years    Types: Cigarettes  . Smokeless tobacco: Never Used  . Alcohol Use: No   Additional social history: marijuana and tobacco use, no alcohol.  Please also refer to relevant sections of EMR.  Family History: Family History  Problem Relation Age of Onset  . Diabetes type II Mother   . Diabetes Mother   . Hypertension Mother   . Diabetes type II Sister   . Diabetes Sister   . Hypertension Sister   . Hypertension Brother     Allergies and Medications: Allergies  Allergen Reactions  . Bee Venom Anaphylaxis  . Acetaminophen Nausea And Vomiting  . Novolog [Insulin Aspart] Other (See Comments)    Muscles in feet cramp  . Other     Sorbaview causes rash  . Tegaderm Ag Mesh [Silver]     Blisters  . Erythromycin Nausea And Vomiting   Current Facility-Administered Medications on File Prior to Encounter  Medication Dose Route Frequency Provider Last Rate Last Dose  . 0.9 %  sodium chloride infusion  Intravenous Continuous Doreene Eland, MD 20 mL/hr at 04/19/13 1500 500 mL at 04/19/13 1500   Current Outpatient Prescriptions on File Prior to Encounter  Medication Sig Dispense Refill  . carvedilol (COREG) 25 MG tablet Take 1 tablet (25 mg total) by mouth 2 (two) times daily with a meal. 60 tablet 2  . promethazine (PHENERGAN) 25 MG tablet Take 1 tablet (25 mg total) by mouth every 6 (six) hours as needed for nausea or vomiting. 60 tablet 2  . aspirin EC 81 MG tablet Take 1 tablet (81 mg total) by mouth daily. (Patient not taking: Reported on 10/15/2015) 90 tablet 3  . blood glucose meter kit and supplies KIT Dispense based on patient and insurance preference. Use up to four times daily as directed. (FOR ICD-9 250.00, 250.01). 1 each 0  . cetirizine (ZYRTEC) 10 MG tablet Take 1 tablet (10 mg total) by mouth daily. (Patient not taking: Reported on 11/22/2015) 30 tablet 11  . docusate sodium (COLACE) 100 MG capsule Take 1 capsule (100 mg total) by  mouth 2 (two) times daily. (Patient not taking: Reported on 12/16/2015) 30 capsule 0  . famotidine (PEPCID) 20 MG tablet Take 1 tablet (20 mg total) by mouth 2 (two) times daily. 60 tablet 0  . hydrALAZINE (APRESOLINE) 10 MG tablet Take 1 tablet (10 mg total) by mouth 3 (three) times daily. (Patient not taking: Reported on 10/15/2015) 30 tablet 5  . insulin glargine (LANTUS) 100 UNIT/ML injection Inject 0.11 mLs (11 Units total) into the skin daily. 10 mL 0  . lidocaine (XYLOCAINE) 2 % solution Use as directed 20 mLs in the mouth or throat every 3 (three) hours as needed for mouth pain. 100 mL 3  . LORazepam (ATIVAN) 0.5 MG tablet Take 1 tablet (0.5 mg total) by mouth every 8 (eight) hours as needed (severe nausea, vomiting). 30 tablet 0  . oxyCODONE (ROXICODONE) 5 MG immediate release tablet Take 1 tablet (5 mg total) by mouth every 4 (four) hours as needed for severe pain. (Patient not taking: Reported on 11/22/2015) 5 tablet 0  . polyethylene glycol (MIRALAX / GLYCOLAX) packet Take 17 g by mouth daily as needed. May increase in 2-3 days to 2 times daily, Until stooling regularly (Patient taking differently: Take 17 g by mouth daily as needed for mild constipation. May increase in 2-3 days to 2 times daily, Until stooling regularly) 30 packet prn  . pregabalin (LYRICA) 50 MG capsule TAKE 1 TABLET BY MOUTH AT BEDTIME AS NEEDED 30 capsule 1    Objective: BP 227/107 mmHg  Pulse 106  Temp(Src) 98.5 F (36.9 C) (Oral)  Resp 16  Ht 5\' 4"  (1.626 m)  SpO2 100% Exam: General: Mild distress, sitting up in bed, active emesis Eyes: Extraocular movements intact ENTM: Tacky mucous membranes Neck: Supple Cardiovascular: Tachycardic, S1-S2, no murmurs or rubs Respiratory: Clear to auscultation bilaterally, no wheezing or crackles, normal effort Abdomen: Soft, nondistended, tenderness to palpation in the upper right quadrant and centralized quadrant MSK: Moves all extension freely, no edema Skin: No  rashes Neuro: No gross deficits Psych: Oriented and alert  Labs and Imaging: CBC BMET   Recent Labs Lab 01/20/16 0749 01/20/16 0755  WBC 7.1  --   HGB 12.3 14.3  HCT 36.7 42.0  PLT 199  --     Recent Labs Lab 01/20/16 0749 01/20/16 0755  NA 143 143  K 4.6 4.6  CL 111 109  CO2 22  --   BUN 26* 30*  CREATININE 1.91* 1.70*  GLUCOSE 204* 198*  CALCIUM 10.1  --      Urinalysis    Component Value Date/Time   COLORURINE YELLOW 01/20/2016 0739   APPEARANCEUR CLEAR 01/20/2016 0739   LABSPEC 1.012 01/20/2016 0739   PHURINE 6.5 01/20/2016 0739   GLUCOSEU 100* 01/20/2016 0739   HGBUR SMALL* 01/20/2016 0739   HGBUR small 10/19/2010 0852   BILIRUBINUR NEGATIVE 01/20/2016 0739   BILIRUBINUR NEG 12/04/2015 1017   KETONESUR NEGATIVE 01/20/2016 0739   PROTEINUR 30* 01/20/2016 0739   PROTEINUR 30 12/04/2015 1017   UROBILINOGEN 0.2 12/04/2015 1017   UROBILINOGEN 0.2 09/26/2015 1031   NITRITE NEGATIVE 01/20/2016 0739   NITRITE NEG 12/04/2015 1017   LEUKOCYTESUR NEGATIVE 01/20/2016 0739    Myra Rude, MD 01/20/2016, 2:17 PM PGY-3, [facility] Family Medicine FPTS Intern pager: [phone], text pages welcome

## 2016-01-20 NOTE — ED Notes (Signed)
Patient given Ice Chips ?

## 2016-01-20 NOTE — ED Provider Notes (Signed)
CSN: [id number]     Arrival date & time 01/20/16  7505 History   First MD Initiated Contact with Patient 01/20/16 [phone]     Chief Complaint  Patient presents with  . Emesis  . Abdominal Pain     (Consider location/radiation/quality/duration/timing/severity/associated sxs/prior Treatment) HPI Comments: [name] [name] is a 50 y.o. female with PMHx of cyclic vomiting syndrome, GERD, DM, CKD and diabetic gastroparesis presenting with abdominal pain x3 days and N/V x1 day. States that abdominal pain started 3 days ago, epigastric, aching.  Woke this AM around 330am with vomiting.  Has vomited about 7 times this AM.  Vomit is NBNB.  Has not tried any medications or liquids.  States this is similar to previous episodes of cyclic vomiting syndrome.  She cannot state any aggravating or alleviating factors.  Also reports chest pain that seems to radiate up from abdominal pain.  It is achy in quality and constant.  Denies any SOB.  Patient is a 50 y.o. female presenting with vomiting and abdominal pain. The history is provided by the patient.  Emesis Severity:  Severe Duration:  6 hours Timing:  Intermittent Number of daily episodes:  7 Quality:  Stomach contents Progression:  Unchanged Chronicity:  Recurrent Recent urination:  Normal Context: not post-tussive and not self-induced   Relieved by:  None tried Worsened by:  Nothing tried Ineffective treatments:  None tried Associated symptoms: abdominal pain   Associated symptoms: no chills, no diarrhea and no fever   Abdominal Pain Associated symptoms: chest pain, nausea and vomiting   Associated symptoms: no chills, no constipation, no cough, no diarrhea, no dysuria, no fever and no shortness of breath     Past Medical History  Diagnosis Date  . PANCREATITIS 05/09/2008  . Cyclic vomiting syndrome   . Hypertension   . Complication of anesthesia     "problems waking up"  . Heart murmur   . Myocardial infarction [facility]) 07/2007    "they say  I've had a silent one; I don't know"  . Pneumonia   . Anemia   . Blood transfusion   . GERD (gastroesophageal reflux disease)   . Anxiety   . Depression   . Smoker   . Marijuana smoker, continuous ([place])   . NEUROPATHY, DIABETIC 02/23/2007  . Type II diabetes mellitus ([place])   . Diabetic gastroparesis ([place])     /e-chart  . Chronic kidney disease (CKD), stage III (moderate)   . Nausea & vomiting 06/10/2015  . Intestinal impaction [facility])    Past Surgical History  Procedure Laterality Date  . Port-a-cath placement  ~ 2008    right chest; "poor access; frequent hospitalizations"  . Laparotomy and lysis of adhesions    . Abdominal hysterectomy  02/2002  . Cholecystectomy  1980's  . Cardiac catheterization    . Dilation and evacuation  X3  . Left hand surgery    . Cataract extraction w/ intraocular lens  implant, bilateral    . Colonoscopy    . Upper gi endoscopy    . Cesarean section     Family History  Problem Relation Age of Onset  . Diabetes type II Mother   . Diabetes Mother   . Hypertension Mother   . Diabetes type II Sister   . Diabetes Sister   . Hypertension Sister   . Hypertension Brother    Social History  Substance Use Topics  . Smoking status: Current Some Day Smoker -- 0.50 packs/day for 22 years  Types: Cigarettes  . Smokeless tobacco: Never Used  . Alcohol Use: No   OB History    Gravida Para Term Preterm AB TAB SAB Ectopic Multiple Living   '7    3 3    4     ' Review of Systems  Constitutional: Positive for diaphoresis. Negative for fever and chills.  Respiratory: Negative for cough, chest tightness and shortness of breath.   Cardiovascular: Positive for chest pain. Negative for palpitations and leg swelling.  Gastrointestinal: Positive for nausea, vomiting and abdominal pain. Negative for diarrhea and constipation.  Genitourinary: Negative for dysuria and difficulty urinating.  All other systems reviewed and are negative.     Allergies  Bee  venom; Acetaminophen; Novolog; Other; Tegaderm ag mesh; and Erythromycin  Home Medications   Prior to Admission medications   Medication Sig Start Date End Date Taking? Authorizing Provider  carvedilol (COREG) 25 MG tablet Take 1 tablet (25 mg total) by mouth 2 (two) times daily with a meal. 01/14/16  Yes Olam Idler, MD  lisinopril (PRINIVIL,ZESTRIL) 40 MG tablet Take 40 mg by mouth daily. 12/09/15  Yes Historical Provider, MD  promethazine (PHENERGAN) 25 MG tablet Take 1 tablet (25 mg total) by mouth every 6 (six) hours as needed for nausea or vomiting. 12/04/15  Yes Leone Brand, MD  aspirin EC 81 MG tablet Take 1 tablet (81 mg total) by mouth daily. Patient not taking: Reported on 10/15/2015 06/02/15   Hilton Sinclair, MD  blood glucose meter kit and supplies KIT Dispense based on patient and insurance preference. Use up to four times daily as directed. (FOR ICD-9 250.00, 250.01). 06/02/15   Hilton Sinclair, MD  cetirizine (ZYRTEC) 10 MG tablet Take 1 tablet (10 mg total) by mouth daily. Patient not taking: Reported on 11/22/2015 06/20/15   Hilton Sinclair, MD  docusate sodium (COLACE) 100 MG capsule Take 1 capsule (100 mg total) by mouth 2 (two) times daily. Patient not taking: Reported on 12/16/2015 12/04/15   Leone Brand, MD  famotidine (PEPCID) 20 MG tablet Take 1 tablet (20 mg total) by mouth 2 (two) times daily. 12/17/15   Virginia Crews, MD  hydrALAZINE (APRESOLINE) 10 MG tablet Take 1 tablet (10 mg total) by mouth 3 (three) times daily. Patient not taking: Reported on 10/15/2015 09/29/15   Veatrice Bourbon, MD  insulin glargine (LANTUS) 100 UNIT/ML injection Inject 0.11 mLs (11 Units total) into the skin daily. 12/17/15   Virginia Crews, MD  lidocaine (XYLOCAINE) 2 % solution Use as directed 20 mLs in the mouth or throat every 3 (three) hours as needed for mouth pain. 11/24/15   Sela Hua, MD  LORazepam (ATIVAN) 0.5 MG tablet Take 1 tablet (0.5 mg total) by  mouth every 8 (eight) hours as needed (severe nausea, vomiting). 12/17/15   Virginia Crews, MD  oxyCODONE (ROXICODONE) 5 MG immediate release tablet Take 1 tablet (5 mg total) by mouth every 4 (four) hours as needed for severe pain. Patient not taking: Reported on 11/22/2015 11/04/15   Veatrice Bourbon, MD  polyethylene glycol (MIRALAX / GLYCOLAX) packet Take 17 g by mouth daily as needed. May increase in 2-3 days to 2 times daily, Until stooling regularly Patient taking differently: Take 17 g by mouth daily as needed for mild constipation. May increase in 2-3 days to 2 times daily, Until stooling regularly 06/02/15   Hilton Sinclair, MD  pregabalin (LYRICA) 50 MG capsule TAKE 1 TABLET BY  MOUTH AT BEDTIME AS NEEDED 01/14/16   Olam Idler, MD   BP 182/105 mmHg  Pulse 96  Temp(Src) 98 F (36.7 C) (Oral)  Resp 16  Ht '5\' 4"'  (1.626 m)  SpO2 100% Physical Exam  Constitutional: She is oriented to person, place, and time. She appears well-developed and well-nourished.  Diaphoretic and tearful  HENT:  Head: Normocephalic and atraumatic.  Mouth/Throat: Oropharynx is clear and moist. No oropharyngeal exudate.  Eyes: Conjunctivae and EOM are normal. Pupils are equal, round, and reactive to light. No scleral icterus.  Neck: Neck supple.  Cardiovascular: Normal rate, regular rhythm, normal heart sounds and intact distal pulses.   No murmur heard. Pulmonary/Chest: Effort normal and breath sounds normal. No respiratory distress. She has no wheezes. She has no rales.  Abdominal: Soft. Bowel sounds are normal. She exhibits no distension. There is no rebound and no guarding.  TTP in epigastrium, RUQ, LUQ  Musculoskeletal: She exhibits no edema or tenderness.  Lymphadenopathy:    She has no cervical adenopathy.  Neurological: She is alert and oriented to person, place, and time.  Skin: Skin is warm. No rash noted.  Psychiatric: She has a normal mood and affect. Her behavior is normal.    ED  Course  Procedures (including critical care time) Labs Review Labs Reviewed  COMPREHENSIVE METABOLIC PANEL - Abnormal; Notable for the following:    Glucose, Bld 204 (*)    BUN 26 (*)    Creatinine, Ser 1.91 (*)    GFR calc non Af Amer 30 (*)    GFR calc Af Amer 34 (*)    All other components within normal limits  URINALYSIS, ROUTINE W REFLEX MICROSCOPIC (NOT AT [facility]) - Abnormal; Notable for the following:    Glucose, UA 100 (*)    Hgb urine dipstick SMALL (*)    Protein, ur 30 (*)    All other components within normal limits  URINE MICROSCOPIC-ADD ON - Abnormal; Notable for the following:    Squamous Epithelial / LPF 6-30 (*)    Bacteria, UA FEW (*)    All other components within normal limits  I-STAT CHEM 8, ED - Abnormal; Notable for the following:    BUN 30 (*)    Creatinine, Ser 1.70 (*)    Glucose, Bld 198 (*)    Calcium, Ion 1.26 (*)    All other components within normal limits  LIPASE, BLOOD  CBC  URINE RAPID DRUG SCREEN, HOSP PERFORMED  I-STAT TROPOININ, ED    Imaging Review No results found. I have personally reviewed and evaluated these images and lab results as part of my medical decision-making.   EKG Interpretation   Date/Time:  Tuesday January 20 2016 07:26:06 EST Ventricular Rate:  95 PR Interval:  150 QRS Duration: 66 QT Interval:  326 QTC Calculation: 409 R Axis:   76 Text Interpretation:  Normal sinus rhythm Low voltage QRS Cannot rule out  Anteroseptal infarct , age undetermined Abnormal ECG Confirmed by BEATON   MD, ROBERT ([id number]) on 01/20/2016 12:10:47 PM      MDM   Final diagnoses:  Non-intractable cyclical vomiting with nausea    50 y.o. F with h/o diabetic gastroparesis, GERD and cyclic vomiting syndrome presenting for abd pain and N/V.  Reports this is similar to previous episodes of cyclic vomiting.  Given phenergan IM, and patient continued to vomit profusely.  IM Haldol then given for treatment of cyclic vomiting, and some  improvement in symptoms.  Patient continues to  experience abd pain (Morphine given) and profound nausea.  Lipase wnl, no evidence of infection, Cr at baseline.  BP elevated, but this is while profusely vomiting.  Suspect CP is related to abd and vomiting.  Troponin neg and EKG unchanged from previous.  Also given 1L NS bolus.  Plan to admit patient for treatment of cyclic vomiting, abd pain control, and rehydration.  Spoke to [place], who will admit.    Virginia Crews, MD 01/20/16 [place], MD 01/21/16 [phone]

## 2016-01-20 NOTE — Progress Notes (Signed)
Pt arrived to unit. Pt was oriented to unit and call bell. Pt placed in bed. CBG and Vs were taken. Pt in no apparent distress at this time. Will continue to monitor pt

## 2016-01-20 NOTE — ED Notes (Signed)
Attempted report 

## 2016-01-20 NOTE — ED Notes (Signed)
RN attempted IV x2 . Patient actively vomiting IV team unable to assess port. MD made aware Phenergan given IM then IV team will attempts to assess port again.

## 2016-01-20 NOTE — ED Notes (Signed)
Patient has been advised to please keep cardiac monitoring equipment on for safety reasons.

## 2016-01-20 NOTE — ED Notes (Signed)
Patient family member request to be called when patient gets a room: Oleta Mouse [phone]

## 2016-01-21 ENCOUNTER — Encounter (HOSPITAL_COMMUNITY): Payer: Self-pay | Admitting: General Practice

## 2016-01-21 DIAGNOSIS — R1115 Cyclical vomiting syndrome unrelated to migraine: Secondary | ICD-10-CM | POA: Insufficient documentation

## 2016-01-21 DIAGNOSIS — G43A1 Cyclical vomiting, intractable: Secondary | ICD-10-CM

## 2016-01-21 DIAGNOSIS — G43A0 Cyclical vomiting, not intractable: Principal | ICD-10-CM

## 2016-01-21 LAB — CBC
HEMATOCRIT: 28.2 % — AB (ref 36.0–46.0)
HEMOGLOBIN: 9.3 g/dL — AB (ref 12.0–15.0)
MCH: 30.4 pg (ref 26.0–34.0)
MCHC: 33 g/dL (ref 30.0–36.0)
MCV: 92.2 fL (ref 78.0–100.0)
Platelets: 151 10*3/uL (ref 150–400)
RBC: 3.06 MIL/uL — ABNORMAL LOW (ref 3.87–5.11)
RDW: 14.9 % (ref 11.5–15.5)
WBC: 7.4 10*3/uL (ref 4.0–10.5)

## 2016-01-21 LAB — BASIC METABOLIC PANEL
Anion gap: 8 (ref 5–15)
BUN: 21 mg/dL — ABNORMAL HIGH (ref 6–20)
CALCIUM: 8.7 mg/dL — AB (ref 8.9–10.3)
CHLORIDE: 106 mmol/L (ref 101–111)
CO2: 24 mmol/L (ref 22–32)
CREATININE: 1.83 mg/dL — AB (ref 0.44–1.00)
GFR, EST AFRICAN AMERICAN: 36 mL/min — AB (ref >60)
GFR, EST NON AFRICAN AMERICAN: 31 mL/min — AB (ref >60)
Glucose, Bld: 214 mg/dL — ABNORMAL HIGH (ref 65–99)
Potassium: 4 mmol/L (ref 3.5–5.1)
SODIUM: 138 mmol/L (ref 135–145)

## 2016-01-21 LAB — GLUCOSE, CAPILLARY
GLUCOSE-CAPILLARY: 134 mg/dL — AB (ref 65–99)
GLUCOSE-CAPILLARY: 103 mg/dL — AB (ref 65–99)
Glucose-Capillary: 111 mg/dL — ABNORMAL HIGH (ref 65–99)
Glucose-Capillary: 241 mg/dL — ABNORMAL HIGH (ref 65–99)

## 2016-01-21 MED ORDER — HEPARIN SOD (PORK) LOCK FLUSH 100 UNIT/ML IV SOLN
500.0000 [IU] | INTRAVENOUS | Status: AC | PRN
  Administered 2016-01-21: 500 [IU]

## 2016-01-21 MED ORDER — LORAZEPAM 2 MG/ML IJ SOLN
1.0000 mg | Freq: Three times a day (TID) | INTRAMUSCULAR | Status: DC | PRN
  Administered 2016-01-21: 1 mg via INTRAVENOUS
  Filled 2016-01-21: qty 1

## 2016-01-21 MED ORDER — PROMETHAZINE HCL 12.5 MG RE SUPP: 12.5000 mg | Freq: Four times a day (QID) | RECTAL | Status: DC | PRN

## 2016-01-21 MED ORDER — OXYCODONE HCL 5 MG PO TABS: 5.0000 mg | ORAL_TABLET | ORAL | Status: DC | PRN

## 2016-01-21 MED ORDER — LORAZEPAM 0.5 MG PO TABS: 0.5000 mg | ORAL_TABLET | Freq: Three times a day (TID) | ORAL | Status: DC | PRN

## 2016-01-21 NOTE — Progress Notes (Signed)
UR COMPLETED  

## 2016-01-21 NOTE — Progress Notes (Signed)
Inpatient Diabetes Program Recommendations  AACE/ADA: New Consensus Statement on Inpatient Glycemic Control (2015)  Target Ranges:  Prepandial:   less than 140 mg/dL      Peak postprandial:   less than 180 mg/dL (1-2 hours)      Critically ill patients:  140 - 180 mg/dL   Spoke with patient about diabetes and home regimen for diabetes control. Patient reports that she is followed by her PCP Dr. Berkley Harvey for diabetes management and currently she takes Lantus 11 units. When she is feeling ill with these abd pain episodes which she states has been often in the last 3 months she will sometimes reduce her lantus to 7 units, but will often not take it. She sometimes is sick for up to 10 days at a time. Discussed A1C results (11.1% on 12/16/2015) and explained what an A1C is, basic pathophysiology of DM Type 2, basic home care, importance of checking CBGs and maintaining good CBG control to prevent long-term and short-term complications. Discussed impact of nutrition, exercise, stress, sickness, and medications on diabetes control. Patient states that she will get her A1c down. Patient verbalized understanding of information discussed and she states that she has no further questions at this time related to diabetes.   Thanks, Tama Headings RN, MSN, [facility] Inpatient Diabetes Coordinator Team Pager [phone] (8a-5p)

## 2016-01-21 NOTE — Progress Notes (Signed)
Family Medicine Teaching Service Daily Progress Note Intern Pager: [phone]  Patient name: [name] [name] Medical record number: [id number] Date of birth: 1966/10/02 Age: 50 y.o. Gender: female  Primary Care Provider: Phill Myron, MD Consultants: None Code Status: Full  Pt Overview and Major Events to Date:  1/24: Admitted to FMTS for intractable nausea/vomiting  Assessment and Plan: [name] [name] is a 50 y.o. female presenting with nausea, vomiting, and abdominal pain . PMH is significant for cyclic vomiting syndrome, gastroparesis, marijuana abuse, depression, DM, HTN, CKD III.   Nausea/Vomiting with abdominal pain, improved: Did not require any doses of Phenergan overnight. Tachycardia has improved from 120s last night to 80s-90s this morning. Pt states she is feeling much better this morning. On exam, her abdomen is soft and non-distended, mildly "sore" to palpation. WBC 7.1. - Pt has been NPO. Will advance to clears today. - Continue MIVF: NS @ 150 mL/hr until PO intake improves. - Phenergan 12.5 mg q6hrsIV - Dilaudid 0.5 mg Q4 PRN for pain - Scheduled Ativan 1mg  tid x 24 hours.  HTN: Likely associated with the acute vomiting episode. BPs up to 189/95 overnight, improved to 143/61 this morning. - Hydralazine 5 mg IV Q4 PRN - Holding home Carvedilol 25mg  bid, Hydralazine 10mg  tid, Lisinopril 40mg  daily. Will restart today if she is able to tolerate PO.  T2DM:CBG up to 286 yesterday evening, improved to 134 this morning.  - Holding home Lantus for now, given that she is NPO  -SSI - CBG's   CKD III: Cr 1.9 on admission, baseline ~2.  - Holding home Lisinopril - MIVF as above  FEN/GI: NS 150 mL/hr/ NPO  Prophylaxis: heparin subq .  Disposition: Pending improvement in symptoms and ability to tolerate PO.  Subjective:  Pt states she feels much better this morning. Her abdomen is "sore" and "achy". She did not have any episodes of vomiting overnight. She would  like to try to clears this morning.  Objective: Temp:  [98 F (36.7 C)-98.5 F (36.9 C)] 98.5 F (36.9 C) (01/25 0609) Pulse Rate:  [86-121] 99 (01/25 0609) Resp:  [16-18] 16 (01/25 0609) BP: (118-236)/(61-211) 143/61 mmHg (01/25 0609) SpO2:  [96 %-100 %] 100 % (01/25 [id number]) Physical Exam: General: Laying in bed, in NAD, talkative HEENT: [place]/AT, EOMI, mildly dry mucous membranes Neck: Supple Cardiovascular: RRR, S1-S2, no murmurs Respiratory: Clear to auscultation bilaterally, no wheezing or crackles, normal effort Abdomen: Soft, nondistended, generalized "soreness" to palpation, no rebound or guarding MSK: Moves all extension freely, no edema Skin: No rashes Neuro: No gross deficits Psych: Appropriate affect, normal behavior  Laboratory:  Recent Labs Lab 01/20/16 0749 01/20/16 0755  WBC 7.1  --   HGB 12.3 14.3  HCT 36.7 42.0  PLT 199  --     Recent Labs Lab 01/20/16 0749 01/20/16 0755  NA 143 143  K 4.6 4.6  CL 111 109  CO2 22  --   BUN 26* 30*  CREATININE 1.91* 1.70*  CALCIUM 10.1  --   PROT 7.6  --   BILITOT 0.6  --   ALKPHOS 75  --   ALT 14  --   AST 20  --   GLUCOSE 204* 198*   Troponin 0.02 UA: few bacteria, small Hgb, negative leukocytes, negative nitrites, 0-5 WBC UDS: positive for [facility]  Imaging/Diagnostic Tests: None  Sela Hua, MD 01/21/2016, 6:58 AM PGY-1, [place] Intern pager: [phone], text pages welcome

## 2016-01-21 NOTE — Discharge Summary (Signed)
[facility] Discharge Summary  Patient name: [name] [name] Medical record number: [id number] Date of birth: 16-Jul-1966 Age: 50 y.o. Gender: female Date of Admission: 01/20/2016  Date of Discharge: 01/21/16 Admitting Physician: Blane Ohara McDiarmid, MD  Primary Care Provider: Phill Myron, MD Consultants: None  Indication for Hospitalization: Intractable nausea and vomiting  Discharge Diagnoses/Problem List:  Cyclic vomiting syndrome HTN T2DM Depression Anxiety Tobacco abuse Migraine GERD Marijuana use  Disposition: Home  Discharge Condition: Stable  Discharge Exam:  General: Laying in bed, in NAD, talkative HEENT: [place]/AT, EOMI, mildly dry mucous membranes Neck: Supple Cardiovascular: RRR, S1-S2, no murmurs Respiratory: Clear to auscultation bilaterally, no wheezing or crackles, normal effort Abdomen: Soft, nondistended, generalized "soreness" to palpation, no rebound or guarding MSK: Moves all extension freely, no edema Skin: No rashes Neuro: No gross deficits Psych: Appropriate affect, normal behavior  Brief Hospital Course:  [name] [name] is a 50 year old female who presented to the ED with intractable nausea and vomiting and abdominal pain. In the ED, she was vomiting profusely despite administration of IM phenergan. She was then given IM Haldol and her symptoms slightly improved. Lipase was normal and Cr was at baseline. She was given a 1L NS bolus. Her BP was elevated to 227/107, likely secondary to the vomiting. UDS was positive for THC and she endorsed recent marijuana use. She was admitted for further management.  We made her NPO and gave her MIVFs. We treated her nausea with Phenergan 12.34m q6hrs IV. In the past, her vomiting has responded well to Ativan, so we scheduled Ativan 193mtid. Her nausea and vomiting subsequently subsided and her abdominal pain improved. We advanced her diet to clears and then to soft foods, and she was able to  tolerate this without nausea or vomiting. Once she stopped vomiting, her blood pressures came down to 149/72. She was discharged home with close PCP follow-up and prescriptions for Oxycodone #12, Ativan #12, and Phenergan suppositories.   The rest of her chronic medical conditions were stable and no changes were made to her home medications.  Issues for Follow Up:  1. Recommend continued substance abuse counseling, given that her cyclic vomiting is likely secondary to marijuana use  Significant Procedures: None  Significant Labs and Imaging:   Recent Labs Lab 01/20/16 0749 01/20/16 0755 01/21/16 1350  WBC 7.1  --  7.4  HGB 12.3 14.3 9.3*  HCT 36.7 42.0 28.2*  PLT 199  --  151    Recent Labs Lab 01/20/16 0749 01/20/16 0755 01/21/16 1105  NA 143 143 138  K 4.6 4.6 4.0  CL 111 109 106  CO2 22  --  24  GLUCOSE 204* 198* 214*  BUN 26* 30* 21*  CREATININE 1.91* 1.70* 1.83*  CALCIUM 10.1  --  8.7*  ALKPHOS 75  --   --   AST 20  --   --   ALT 14  --   --   ALBUMIN 4.2  --   --    Troponin 0.02 UA: few bacteria, small Hgb, negative leukocytes, negative nitrites, 0-5 WBC UDS: positive for THC  Results/Tests Pending at Time of Discharge: None  Discharge Medications:    Medication List    TAKE these medications        aspirin EC 81 MG tablet  Take 1 tablet (81 mg total) by mouth daily.     blood glucose meter kit and supplies Kit  Dispense based on patient and insurance preference. Use  up to four times daily as directed. (FOR ICD-9 250.00, 250.01).     carvedilol 25 MG tablet  Commonly known as:  COREG  Take 1 tablet (25 mg total) by mouth 2 (two) times daily with a meal.     cetirizine 10 MG tablet  Commonly known as:  ZYRTEC  Take 1 tablet (10 mg total) by mouth daily.     docusate sodium 100 MG capsule  Commonly known as:  COLACE  Take 1 capsule (100 mg total) by mouth 2 (two) times daily.     famotidine 20 MG tablet  Commonly known as:  PEPCID  Take 1  tablet (20 mg total) by mouth 2 (two) times daily.     hydrALAZINE 10 MG tablet  Commonly known as:  APRESOLINE  Take 1 tablet (10 mg total) by mouth 3 (three) times daily.     insulin glargine 100 UNIT/ML injection  Commonly known as:  LANTUS  Inject 0.11 mLs (11 Units total) into the skin daily.     lidocaine 2 % solution  Commonly known as:  XYLOCAINE  Use as directed 20 mLs in the mouth or throat every 3 (three) hours as needed for mouth pain.     lisinopril 40 MG tablet  Commonly known as:  PRINIVIL,ZESTRIL  Take 40 mg by mouth daily.     LORazepam 0.5 MG tablet  Commonly known as:  ATIVAN  Take 1 tablet (0.5 mg total) by mouth every 8 (eight) hours as needed (severe nausea, vomiting).     oxyCODONE 5 MG immediate release tablet  Commonly known as:  ROXICODONE  Take 1 tablet (5 mg total) by mouth every 4 (four) hours as needed for severe pain.     polyethylene glycol packet  Commonly known as:  MIRALAX / GLYCOLAX  Take 17 g by mouth daily as needed. May increase in 2-3 days to 2 times daily, Until stooling regularly     pregabalin 50 MG capsule  Commonly known as:  LYRICA  TAKE 1 TABLET BY MOUTH AT BEDTIME AS NEEDED     promethazine 25 MG tablet  Commonly known as:  PHENERGAN  Take 1 tablet (25 mg total) by mouth every 6 (six) hours as needed for nausea or vomiting.     promethazine 12.5 MG suppository  Commonly known as:  PHENERGAN  Place 1 suppository (12.5 mg total) rectally every 6 (six) hours as needed for nausea.        Discharge Instructions: Please refer to Patient Instructions section of EMR for full details.  Patient was counseled important signs and symptoms that should prompt return to medical care, changes in medications, dietary instructions, activity restrictions, and follow up appointments.   Follow-Up Appointments: Follow-up Information    Follow up with Phill Myron, MD On 01/23/2016.   Specialty:  Family Medicine   Why:  hospital follow-up  appointment at 3:00pm.   Contact information:   [place] [address] [phone]       Sela Hua, MD 01/21/2016, 6:29 PM PGY-1, [place]

## 2016-01-21 NOTE — Discharge Instructions (Signed)
You were hospitalized for nausea and vomiting. We gave you IVFs and anti-nausea medications and your nausea improved. You were able to tolerate a soft diet.  If you have nausea and vomiting at home, please try using the Phenergan suppository that we have prescribed.  We hope you have a very happy birthday tomorrow!! :)

## 2016-01-23 ENCOUNTER — Ambulatory Visit (INDEPENDENT_AMBULATORY_CARE_PROVIDER_SITE_OTHER): Payer: Medicare Other | Admitting: Family Medicine

## 2016-01-23 ENCOUNTER — Telehealth: Payer: Self-pay | Admitting: Family Medicine

## 2016-01-23 ENCOUNTER — Encounter: Payer: Self-pay | Admitting: Family Medicine

## 2016-01-23 VITALS — BP 142/57 | HR 67 | Temp 98.8°F | Wt 146.7 lb

## 2016-01-23 DIAGNOSIS — G43A1 Cyclical vomiting, intractable: Secondary | ICD-10-CM

## 2016-01-23 DIAGNOSIS — K3184 Gastroparesis: Secondary | ICD-10-CM | POA: Diagnosis not present

## 2016-01-23 DIAGNOSIS — E1322 Other specified diabetes mellitus with diabetic chronic kidney disease: Secondary | ICD-10-CM | POA: Diagnosis not present

## 2016-01-23 DIAGNOSIS — N183 Chronic kidney disease, stage 3 (moderate): Secondary | ICD-10-CM | POA: Diagnosis not present

## 2016-01-23 DIAGNOSIS — E1365 Other specified diabetes mellitus with hyperglycemia: Secondary | ICD-10-CM

## 2016-01-23 DIAGNOSIS — R1115 Cyclical vomiting syndrome unrelated to migraine: Secondary | ICD-10-CM

## 2016-01-23 DIAGNOSIS — IMO0002 Reserved for concepts with insufficient information to code with codable children: Secondary | ICD-10-CM

## 2016-01-23 NOTE — Telephone Encounter (Signed)
Left voicemail for patient to call clinic. She was referred to [facility] for her gastroparesis and need for screening colonoscopy. They recommend she continue with [facility] or return her gastroenterologist at [place].  - If she calls back please ask her which of these she would like to be referred to.

## 2016-01-23 NOTE — Progress Notes (Signed)
  Patient name: [name] [name] MRN [id number]  Date of birth: Jan 12, 1966  CC & HPI:  [name] [name] is a 50 y.o. female presenting today for hospital f/u for N/V.  She reports her nausea, vomiting has resolved and believes she had a viral gastroenteritis.  She reports she has not been able to see gastroenterologist that she was never contacted when last referred to [facility].  She has never had a colonoscopy.  She was unable to follow-up and see if her diabetes meter to be fixed by her pharmacy.   ROS: See HPI   Medical & Surgical Hx:  Reviewed  Medications & Allergies: Reviewed  Social History: Reviewed:   Objective Findings:  Vitals: BP 156/59 mmHg  Pulse 71  Temp(Src) 98.8 F (37.1 C) (Oral)  Wt 146 lb 11.2 oz (66.543 kg)  Gen: NAD CV: RRR w/o m/r/g, pulses +2 b/l Resp: CTAB w/ normal respiratory effort GI: No skin changes; BS + x 4 quads; No tenderness or masses, No CVA tenderness  Assessment & Plan:   Uncontrolled secondary diabetes mellitus with stage 3 CKD (GFR 30-59)  She has yet to take her diabetes meter to her pharmacy to see if taking needed working she was hospitalized for nausea, vomiting.  -  Again advised to call if she is unable to get meter working and I will given new Rx - f/u in 2-3  - may need referral to nephrologist soon  Gastroparesis  Continues to have frequent vomiting episodes requiring hospitalization -  Refer to [facility] for evaluation of gastroparesis as well as need for screening colonoscopy

## 2016-01-23 NOTE — Patient Instructions (Signed)
It was great seeing you today.   I'm glad your nausea and vomiting have resolved.   1.  I have referred you to [facility] gastroenterology for additional evaluation of gastroparesis as well as need for your screening colonoscopy.  Please let me know if you have not heard from them in 1-2 weeks.  2.  Pease let me know if you're unable to get your diabetic provider working.  I'll send a new prescription.    Please bring all your medications to every doctors visit  Sign up for My Chart to have easy access to your labs results, and communication with your Primary care physician.  Next Appointment  Please make an appointment with Dr Berkley Harvey in 2-3 weeks for diabetes   I look forward to talking with you again at our next visit. If you have any questions or concerns before then, please call the clinic at [phone].  Take Care,   Dr Phill Myron

## 2016-01-23 NOTE — Assessment & Plan Note (Signed)
She has yet to take her diabetes meter to her pharmacy to see if taking needed working she was hospitalized for nausea, vomiting.  -  Again advised to call if she is unable to get meter working and I will given new Rx - f/u in 2-3  - may need referral to nephrologist soon

## 2016-01-23 NOTE — Assessment & Plan Note (Signed)
Continues to have frequent vomiting episodes requiring hospitalization -  Refer to [facility] for evaluation of gastroparesis as well as need for screening colonoscopy

## 2016-01-30 ENCOUNTER — Telehealth: Payer: Self-pay | Admitting: Family Medicine

## 2016-01-30 NOTE — Telephone Encounter (Signed)
Pt called and needs a refill on her Lorazepam. Please let patient know when ready to pick up. jw

## 2016-01-30 NOTE — Telephone Encounter (Signed)
Patient was seen in January for her mood issues.  She also has an appt on 02/13/16 for medication refill. Narcisa Ganesh,CMA

## 2016-01-30 NOTE — Telephone Encounter (Signed)
Pt was seen by dr Berkley Harvey on jan 27. She is very upset that the medicine is not being refilled.

## 2016-02-02 NOTE — Telephone Encounter (Signed)
Will forward to MD. [name],CMA  

## 2016-02-03 ENCOUNTER — Ambulatory Visit (INDEPENDENT_AMBULATORY_CARE_PROVIDER_SITE_OTHER): Payer: Medicare Other | Admitting: Family Medicine

## 2016-02-03 ENCOUNTER — Encounter: Payer: Self-pay | Admitting: Family Medicine

## 2016-02-03 VITALS — BP 140/58 | HR 66 | Temp 98.2°F | Wt 143.0 lb

## 2016-02-03 DIAGNOSIS — E1322 Other specified diabetes mellitus with diabetic chronic kidney disease: Secondary | ICD-10-CM

## 2016-02-03 DIAGNOSIS — K3184 Gastroparesis: Secondary | ICD-10-CM

## 2016-02-03 DIAGNOSIS — E1365 Other specified diabetes mellitus with hyperglycemia: Secondary | ICD-10-CM

## 2016-02-03 DIAGNOSIS — G43A1 Cyclical vomiting, intractable: Secondary | ICD-10-CM

## 2016-02-03 DIAGNOSIS — N183 Chronic kidney disease, stage 3 (moderate): Secondary | ICD-10-CM

## 2016-02-03 DIAGNOSIS — F1220 Cannabis dependence, uncomplicated: Secondary | ICD-10-CM

## 2016-02-03 DIAGNOSIS — R1115 Cyclical vomiting syndrome unrelated to migraine: Secondary | ICD-10-CM

## 2016-02-03 DIAGNOSIS — F418 Other specified anxiety disorders: Secondary | ICD-10-CM

## 2016-02-03 DIAGNOSIS — IMO0002 Reserved for concepts with insufficient information to code with codable children: Secondary | ICD-10-CM

## 2016-02-03 DIAGNOSIS — F1290 Cannabis use, unspecified, uncomplicated: Secondary | ICD-10-CM

## 2016-02-03 NOTE — Progress Notes (Signed)
Subjective:    Patient ID: [name] [name], female    DOB: [birth year], 50 y.o.   MRN: [id number]  Seen for Same day visit for   CC:  Medication refill   she reports she is here today for refill of her pain medication and Ativan.  She takes pain medication for her episodic stomach pain associated with her episodes of nausea, vomiting.  Reports taking Ativan for her cyclic vomiting episodes as well as for anxiety.  She reports some nausea today.  No recent vomiting since last hospital discharge.  She continues to smoke marijuana daily.  She has not followed up with gastroenterologist.  She is not obtained a new diabetic meter.  She expresses desire not to use amitriptyline due to problems with this in the past.   Review of Systems   See HPI for ROS. Objective:  BP 140/58 mmHg  Pulse 66  Temp(Src) 98.2 F (36.8 C) (Oral)  Wt 143 lb (64.864 kg)  General: NAD Cardiac: RRR, normal heart sounds, no murmurs. 2+ radial and PT pulses bilaterally Respiratory: CTAB, normal effort Abdomen: soft, nontender, nondistended Bowel sounds present    Assessment & Plan:   Depression with anxiety  Long discussion today about her anxiety and depression likely driving most of her symptoms.  She self medicates her anxiety with marijuana which we have discussed multiple times is linked to cyclic vomiting syndrome.  We have had multiple conversations the past about starting anxiety/depression medication, but she continues to refuse.  -  Provided contact information for local psychiatrists and psychologists.  Strongly recommended she establish care as her underlying anxiety and depression likely strongly contributing to her nausea, vomiting episodes and continued marijuana use.  -  Emphasized that Ativan does not treat underlying anxiety. Did not refill ativan.  We have had multiple conversations in the past about this is not a long-term medication. She continues to not follow-up with me on a regular basis and  goes to [facility] [place], [place] and [place]) for ativan and pain medication refills.  -  Did not start anxiety or depression medication today as would like for her to establish with mental health specialist.  She often contributes her symptoms of withdrawal from pain medicine, benzos and THC to adverse effects of psychiatric medications - f/u in 2-3 weeks  Marijuana smoker  Again discuss the association with marijuana and cessation of marijuana smoking with nausea and vomiting.  -  Recommendation stopping marijuana use  CKD (chronic kidney disease)  Recommended she follow-up with her nephrologist as underlying CKD may be contributing to her nausea  Uncontrolled secondary diabetes mellitus with stage 3 CKD (GFR 30-59)  Advised her to make appointment with me in one 2 weeks for her diabetes as her uncontrolled blood sugars likely are contributing to her gastroparesis and worsening CKD both of which exacerbate her cyclic vomiting syndrome   Gastroparesis  Advised her to call and make an appointment with [facility] or her [facility] gastroenterologist.  She needs to pick one gastroenterologist, and stick with them.  She will call and let me know if she needs to be referred.  -  Again, stressed the importance of not using chronic opioid medications due to their side effects of nausea, worsening gastroparesis  Cyclic vomiting syndrome  Long discussion today about her cyclic vomiting syndrome in the many things that are contributing to this.  - Long history of anxiety, depression.  We have used Ativan in the past his short-term relief while  attempting to discuss other psychiatric medications as SSRIs, TCAs.  She continues to not follow-up after these discussions and has not  established psychiatric care.  Informed her her today that I would no longer continue benzodiazepines,  Which are doing nothing to treat her underlying anxiety and depression inner ear extremely dangerous in the context of her frequent  opioid use.  Provided with a list of mental health specialist strongly recommend she establish care -  Marijuana use.  Discussed both the use of marijuana and cessation of marijuana is contributing to vomiting episodes.  She reports trying multiple times to stop smoking up to a week which does trigger her episodes and then she resumes smoking THC.  Discussed that she will will likely have these withdrawal and exacerbations which, if need be, can be managed medically in the hospital however, every time she resumes using marijuana she restarts this process.  Recommended marijuana cessation -  Multiple medical problems likely contributing to her nausea and vomiting:  Gastroparesis, CKD, poorly controlled diabetes.  Recommend she establish and maintain care under a gastroenterologist , follow-up with her nephrologist and have regular follow-ups with her PCP for her diabetes.

## 2016-02-03 NOTE — Assessment & Plan Note (Signed)
Again discuss the association with marijuana and cessation of marijuana smoking with nausea and vomiting.  -  Recommendation stopping marijuana use

## 2016-02-03 NOTE — Patient Instructions (Signed)
It was great seeing you today.  Your ongoing nausea, vomiting, cyclic vomiting syndrome is being made worse by your pain medication as well as continued marijuana use.   1. I strongly recommend stopping marijuana 2. I recommend you follow-up with gastroenterologist for your gastroparesis.  You can see [facility] or [facility] as you have seen in the past.  Call and let me know if you're unable to make an appointment with one of these.  3. I recommend you continue to follow up with your kidney doctor.  As underlying chronic kidney disease can make your nausea, vomiting worse.  4. I have given you a list of psychiatry in the area.  I strongly recommend you establishing care with one of these as treating your underlying anxiety, depression is key.    Please bring all your medications to every doctors visit  Sign up for My Chart to have easy access to your labs results, and communication with your Primary care physician.  Next Appointment  Please make an appointment with Dr Berkley Harvey in 2-3 weeks to discuss your diabetes. Bring your meter and all medications to this appointment.    I look forward to talking with you again at our next visit. If you have any questions or concerns before then, please call the clinic at [phone].  Take Care,   Dr Phill Myron

## 2016-02-03 NOTE — Assessment & Plan Note (Signed)
Long discussion today about her anxiety and depression likely driving most of her symptoms.  She self medicates her anxiety with marijuana which we have discussed multiple times is linked to cyclic vomiting syndrome.  We have had multiple conversations the past about starting anxiety/depression medication, but she continues to refuse.  -  Provided contact information for local psychiatrists and psychologists.  Strongly recommended she establish care as her underlying anxiety and depression likely strongly contributing to her nausea, vomiting episodes and continued marijuana use.  -  Emphasized that Ativan does not treat underlying anxiety. Did not refill ativan.  We have had multiple conversations in the past about this is not a long-term medication. She continues to not follow-up with me on a regular basis and goes to [facility] [place], [place] and [place]) for ativan and pain medication refills.  -  Did not start anxiety or depression medication today as would like for her to establish with mental health specialist.  She often contributes her symptoms of withdrawal from pain medicine, benzos and THC to adverse effects of psychiatric medications - f/u in 2-3 weeks

## 2016-02-03 NOTE — Assessment & Plan Note (Signed)
Long discussion today about her cyclic vomiting syndrome in the many things that are contributing to this.  - Long history of anxiety, depression.  We have used Ativan in the past his short-term relief while attempting to discuss other psychiatric medications as SSRIs, TCAs.  She continues to not follow-up after these discussions and has not  established psychiatric care.  Informed her her today that I would no longer continue benzodiazepines,  Which are doing nothing to treat her underlying anxiety and depression inner ear extremely dangerous in the context of her frequent opioid use.  Provided with a list of mental health specialist strongly recommend she establish care -  Marijuana use.  Discussed both the use of marijuana and cessation of marijuana is contributing to vomiting episodes.  She reports trying multiple times to stop smoking up to a week which does trigger her episodes and then she resumes smoking THC.  Discussed that she will will likely have these withdrawal and exacerbations which, if need be, can be managed medically in the hospital however, every time she resumes using marijuana she restarts this process.  Recommended marijuana cessation -  Multiple medical problems likely contributing to her nausea and vomiting:  Gastroparesis, CKD, poorly controlled diabetes.  Recommend she establish and maintain care under a gastroenterologist , follow-up with her nephrologist and have regular follow-ups with her PCP for her diabetes.

## 2016-02-03 NOTE — Assessment & Plan Note (Addendum)
Advised her to call and make an appointment with [facility] or her [facility] gastroenterologist.  She needs to pick one gastroenterologist, and stick with them.  She will call and let me know if she needs to be referred.  -  Again, stressed the importance of not using chronic opioid medications due to their side effects of nausea, worsening gastroparesis

## 2016-02-03 NOTE — Assessment & Plan Note (Signed)
Recommended she follow-up with her nephrologist as underlying CKD may be contributing to her nausea

## 2016-02-03 NOTE — Assessment & Plan Note (Addendum)
Advised her to make appointment with me in one 2 weeks for her diabetes as her uncontrolled blood sugars likely are contributing to her gastroparesis and worsening CKD both of which exacerbate her cyclic vomiting syndrome

## 2016-02-12 ENCOUNTER — Ambulatory Visit: Payer: Self-pay | Admitting: Family Medicine

## 2016-03-21 ENCOUNTER — Inpatient Hospital Stay (HOSPITAL_COMMUNITY)
Admission: EM | Admit: 2016-03-21 | Discharge: 2016-03-23 | DRG: 638 | Disposition: A | Discharge status: Home / Self Care | Payer: Medicare Other | Attending: Family Medicine | Admitting: Family Medicine

## 2016-03-21 ENCOUNTER — Encounter (HOSPITAL_COMMUNITY): Payer: Self-pay | Admitting: Emergency Medicine

## 2016-03-21 ENCOUNTER — Emergency Department (HOSPITAL_COMMUNITY): Payer: Medicare Other

## 2016-03-21 DIAGNOSIS — D649 Anemia, unspecified: Secondary | ICD-10-CM | POA: Diagnosis present

## 2016-03-21 DIAGNOSIS — Z91030 Bee allergy status: Secondary | ICD-10-CM

## 2016-03-21 DIAGNOSIS — Z888 Allergy status to other drugs, medicaments and biological substances status: Secondary | ICD-10-CM

## 2016-03-21 DIAGNOSIS — E1143 Type 2 diabetes mellitus with diabetic autonomic (poly)neuropathy: Secondary | ICD-10-CM | POA: Diagnosis not present

## 2016-03-21 DIAGNOSIS — N183 Chronic kidney disease, stage 3 (moderate): Secondary | ICD-10-CM | POA: Diagnosis not present

## 2016-03-21 DIAGNOSIS — E872 Acidosis: Secondary | ICD-10-CM | POA: Diagnosis not present

## 2016-03-21 DIAGNOSIS — R197 Diarrhea, unspecified: Secondary | ICD-10-CM | POA: Diagnosis not present

## 2016-03-21 DIAGNOSIS — E1122 Type 2 diabetes mellitus with diabetic chronic kidney disease: Secondary | ICD-10-CM | POA: Diagnosis not present

## 2016-03-21 DIAGNOSIS — G43A0 Cyclical vomiting, not intractable: Secondary | ICD-10-CM | POA: Diagnosis present

## 2016-03-21 DIAGNOSIS — K3184 Gastroparesis: Secondary | ICD-10-CM

## 2016-03-21 DIAGNOSIS — G8929 Other chronic pain: Secondary | ICD-10-CM | POA: Diagnosis not present

## 2016-03-21 DIAGNOSIS — Z961 Presence of intraocular lens: Secondary | ICD-10-CM | POA: Diagnosis present

## 2016-03-21 DIAGNOSIS — F411 Generalized anxiety disorder: Secondary | ICD-10-CM | POA: Diagnosis present

## 2016-03-21 DIAGNOSIS — M545 Low back pain: Secondary | ICD-10-CM | POA: Diagnosis not present

## 2016-03-21 DIAGNOSIS — Z90710 Acquired absence of both cervix and uterus: Secondary | ICD-10-CM | POA: Diagnosis not present

## 2016-03-21 DIAGNOSIS — F17210 Nicotine dependence, cigarettes, uncomplicated: Secondary | ICD-10-CM | POA: Diagnosis present

## 2016-03-21 DIAGNOSIS — Z9842 Cataract extraction status, left eye: Secondary | ICD-10-CM

## 2016-03-21 DIAGNOSIS — E1165 Type 2 diabetes mellitus with hyperglycemia: Secondary | ICD-10-CM | POA: Diagnosis not present

## 2016-03-21 DIAGNOSIS — E118 Type 2 diabetes mellitus with unspecified complications: Secondary | ICD-10-CM | POA: Insufficient documentation

## 2016-03-21 DIAGNOSIS — Z886 Allergy status to analgesic agent status: Secondary | ICD-10-CM

## 2016-03-21 DIAGNOSIS — F329 Major depressive disorder, single episode, unspecified: Secondary | ICD-10-CM | POA: Diagnosis present

## 2016-03-21 DIAGNOSIS — I252 Old myocardial infarction: Secondary | ICD-10-CM | POA: Diagnosis not present

## 2016-03-21 DIAGNOSIS — N179 Acute kidney failure, unspecified: Secondary | ICD-10-CM | POA: Diagnosis not present

## 2016-03-21 DIAGNOSIS — E1310 Other specified diabetes mellitus with ketoacidosis without coma: Principal | ICD-10-CM | POA: Diagnosis present

## 2016-03-21 DIAGNOSIS — Z79899 Other long term (current) drug therapy: Secondary | ICD-10-CM | POA: Diagnosis not present

## 2016-03-21 DIAGNOSIS — I129 Hypertensive chronic kidney disease with stage 1 through stage 4 chronic kidney disease, or unspecified chronic kidney disease: Secondary | ICD-10-CM | POA: Diagnosis present

## 2016-03-21 DIAGNOSIS — Z794 Long term (current) use of insulin: Secondary | ICD-10-CM | POA: Diagnosis not present

## 2016-03-21 DIAGNOSIS — R1084 Generalized abdominal pain: Secondary | ICD-10-CM | POA: Insufficient documentation

## 2016-03-21 DIAGNOSIS — Z9841 Cataract extraction status, right eye: Secondary | ICD-10-CM | POA: Diagnosis not present

## 2016-03-21 DIAGNOSIS — I160 Hypertensive urgency: Secondary | ICD-10-CM | POA: Diagnosis not present

## 2016-03-21 DIAGNOSIS — E1121 Type 2 diabetes mellitus with diabetic nephropathy: Secondary | ICD-10-CM

## 2016-03-21 DIAGNOSIS — I10 Essential (primary) hypertension: Secondary | ICD-10-CM | POA: Diagnosis not present

## 2016-03-21 DIAGNOSIS — F1290 Cannabis use, unspecified, uncomplicated: Secondary | ICD-10-CM | POA: Insufficient documentation

## 2016-03-21 DIAGNOSIS — R079 Chest pain, unspecified: Secondary | ICD-10-CM | POA: Diagnosis not present

## 2016-03-21 DIAGNOSIS — E1110 Type 2 diabetes mellitus with ketoacidosis without coma: Secondary | ICD-10-CM

## 2016-03-21 LAB — URINALYSIS, ROUTINE W REFLEX MICROSCOPIC
Bilirubin Urine: NEGATIVE
KETONES UR: 15 mg/dL — AB
Leukocytes, UA: NEGATIVE
Nitrite: NEGATIVE
Specific Gravity, Urine: 1.019 (ref 1.005–1.030)
pH: 6 (ref 5.0–8.0)

## 2016-03-21 LAB — CBC
HEMATOCRIT: 38.3 % (ref 36.0–46.0)
HEMOGLOBIN: 12.9 g/dL (ref 12.0–15.0)
MCH: 30.6 pg (ref 26.0–34.0)
MCHC: 33.7 g/dL (ref 30.0–36.0)
MCV: 91 fL (ref 78.0–100.0)
Platelets: 267 10*3/uL (ref 150–400)
RBC: 4.21 MIL/uL (ref 3.87–5.11)
RDW: 14.3 % (ref 11.5–15.5)
WBC: 15 10*3/uL — AB (ref 4.0–10.5)

## 2016-03-21 LAB — I-STAT VENOUS BLOOD GAS, ED
ACID-BASE DEFICIT: 5 mmol/L — AB (ref 0.0–2.0)
Bicarbonate: 22.5 mEq/L (ref 20.0–24.0)
O2 SAT: 47 %
PO2 VEN: 30 mmHg — AB (ref 31.0–45.0)
TCO2: 24 mmol/L (ref 0–100)
pCO2, Ven: 49.5 mmHg (ref 45.0–50.0)
pH, Ven: 7.266 (ref 7.250–7.300)

## 2016-03-21 LAB — BASIC METABOLIC PANEL
ANION GAP: 19 — AB (ref 5–15)
Anion gap: 14 (ref 5–15)
Anion gap: 12 (ref 5–15)
BUN: 41 mg/dL — ABNORMAL HIGH (ref 6–20)
BUN: 40 mg/dL — AB (ref 6–20)
BUN: 40 mg/dL — AB (ref 6–20)
CHLORIDE: 103 mmol/L (ref 101–111)
CHLORIDE: 115 mmol/L — AB (ref 101–111)
CHLORIDE: 114 mmol/L — AB (ref 101–111)
CO2: 19 mmol/L — ABNORMAL LOW (ref 22–32)
CO2: 20 mmol/L — ABNORMAL LOW (ref 22–32)
CO2: 17 mmol/L — ABNORMAL LOW (ref 22–32)
Calcium: 10.6 mg/dL — ABNORMAL HIGH (ref 8.9–10.3)
Calcium: 9.8 mg/dL (ref 8.9–10.3)
Calcium: 9.1 mg/dL (ref 8.9–10.3)
Creatinine, Ser: 2.84 mg/dL — ABNORMAL HIGH (ref 0.44–1.00)
Creatinine, Ser: 2.65 mg/dL — ABNORMAL HIGH (ref 0.44–1.00)
Creatinine, Ser: 2.38 mg/dL — ABNORMAL HIGH (ref 0.44–1.00)
GFR calc Af Amer: 21 mL/min — ABNORMAL LOW (ref >60)
GFR calc Af Amer: 23 mL/min — ABNORMAL LOW (ref >60)
GFR calc Af Amer: 26 mL/min — ABNORMAL LOW (ref >60)
GFR calc non Af Amer: 22 mL/min — ABNORMAL LOW (ref >60)
GFR, EST NON AFRICAN AMERICAN: 18 mL/min — AB (ref >60)
GFR, EST NON AFRICAN AMERICAN: 20 mL/min — AB (ref >60)
GLUCOSE: 464 mg/dL — AB (ref 65–99)
GLUCOSE: 163 mg/dL — AB (ref 65–99)
GLUCOSE: 159 mg/dL — AB (ref 65–99)
POTASSIUM: 4.1 mmol/L (ref 3.5–5.1)
POTASSIUM: 3.7 mmol/L (ref 3.5–5.1)
POTASSIUM: 4.4 mmol/L (ref 3.5–5.1)
Sodium: 141 mmol/L (ref 135–145)
Sodium: 149 mmol/L — ABNORMAL HIGH (ref 135–145)
Sodium: 143 mmol/L (ref 135–145)

## 2016-03-21 LAB — URINE MICROSCOPIC-ADD ON

## 2016-03-21 LAB — GLUCOSE, CAPILLARY
GLUCOSE-CAPILLARY: 138 mg/dL — AB (ref 65–99)
Glucose-Capillary: 150 mg/dL — ABNORMAL HIGH (ref 65–99)
Glucose-Capillary: 151 mg/dL — ABNORMAL HIGH (ref 65–99)
Glucose-Capillary: 194 mg/dL — ABNORMAL HIGH (ref 65–99)

## 2016-03-21 LAB — HEPATIC FUNCTION PANEL
ALK PHOS: 92 U/L (ref 38–126)
ALT: 18 U/L (ref 14–54)
AST: 25 U/L (ref 15–41)
Albumin: 4.9 g/dL (ref 3.5–5.0)
BILIRUBIN DIRECT: 0.1 mg/dL (ref 0.1–0.5)
BILIRUBIN TOTAL: 1 mg/dL (ref 0.3–1.2)
Indirect Bilirubin: 0.9 mg/dL (ref 0.3–0.9)
Total Protein: 8.7 g/dL — ABNORMAL HIGH (ref 6.5–8.1)

## 2016-03-21 LAB — CBG MONITORING, ED
GLUCOSE-CAPILLARY: 416 mg/dL — AB (ref 65–99)
GLUCOSE-CAPILLARY: 364 mg/dL — AB (ref 65–99)

## 2016-03-21 LAB — I-STAT TROPONIN, ED
TROPONIN I, POC: 0 ng/mL (ref 0.00–0.08)
Troponin i, poc: 0.05 ng/mL (ref 0.00–0.08)

## 2016-03-21 LAB — MRSA PCR SCREENING: MRSA by PCR: NEGATIVE

## 2016-03-21 LAB — LIPASE, BLOOD: Lipase: 33 U/L (ref 11–51)

## 2016-03-21 MED ORDER — HYDROMORPHONE HCL 1 MG/ML IJ SOLN
1.0000 mg | Freq: Once | INTRAMUSCULAR | Status: AC
  Administered 2016-03-21: 1 mg via INTRAVENOUS
  Filled 2016-03-21: qty 1

## 2016-03-21 MED ORDER — PROMETHAZINE HCL 25 MG/ML IJ SOLN: 12.5000 mg | Freq: Four times a day (QID) | INTRAMUSCULAR | Status: DC | PRN

## 2016-03-21 MED ORDER — INSULIN REGULAR HUMAN 100 UNIT/ML IJ SOLN
INTRAMUSCULAR | Status: DC
  Administered 2016-03-21: 3 [IU]/h via INTRAVENOUS
  Filled 2016-03-21: qty 2.5

## 2016-03-21 MED ORDER — SODIUM CHLORIDE 0.9 % IV SOLN
INTRAVENOUS | Status: DC
  Filled 2016-03-21: qty 2.5

## 2016-03-21 MED ORDER — PROMETHAZINE HCL 25 MG/ML IJ SOLN
12.5000 mg | Freq: Four times a day (QID) | INTRAMUSCULAR | Status: DC | PRN
  Administered 2016-03-21: 12.5 mg via INTRAVENOUS
  Filled 2016-03-21: qty 1

## 2016-03-21 MED ORDER — DOCUSATE SODIUM 100 MG PO CAPS
100.0000 mg | ORAL_CAPSULE | Freq: Two times a day (BID) | ORAL | Status: DC
Start: 2016-03-21 — End: 2016-03-23
  Administered 2016-03-21 – 2016-03-23 (×4): 100 mg via ORAL
  Filled 2016-03-21 (×4): qty 1

## 2016-03-21 MED ORDER — DEXTROSE-NACL 5-0.45 % IV SOLN
INTRAVENOUS | Status: DC
  Administered 2016-03-21: 20:00:00 via INTRAVENOUS

## 2016-03-21 MED ORDER — HYDROMORPHONE HCL 1 MG/ML IJ SOLN
0.5000 mg | INTRAMUSCULAR | Status: DC | PRN
  Administered 2016-03-21 – 2016-03-22 (×3): 0.5 mg via INTRAVENOUS
  Filled 2016-03-21 (×3): qty 1

## 2016-03-21 MED ORDER — LABETALOL HCL 5 MG/ML IV SOLN
10.0000 mg | Freq: Once | INTRAVENOUS | Status: AC
  Administered 2016-03-21: 10 mg via INTRAVENOUS
  Filled 2016-03-21: qty 4

## 2016-03-21 MED ORDER — HEPARIN SODIUM (PORCINE) 5000 UNIT/ML IJ SOLN
5000.0000 [IU] | Freq: Three times a day (TID) | INTRAMUSCULAR | Status: DC
  Administered 2016-03-21 – 2016-03-23 (×4): 5000 [IU] via SUBCUTANEOUS
  Filled 2016-03-21 (×4): qty 1

## 2016-03-21 MED ORDER — SODIUM CHLORIDE 0.9 % IV SOLN
INTRAVENOUS | Status: DC
  Administered 2016-03-22: 07:00:00 via INTRAVENOUS
  Administered 2016-03-22: 1000 mL via INTRAVENOUS
  Administered 2016-03-22 – 2016-03-23 (×2): via INTRAVENOUS

## 2016-03-21 MED ORDER — HYDRALAZINE HCL 20 MG/ML IJ SOLN: 5.0000 mg | INTRAMUSCULAR | Status: DC | PRN

## 2016-03-21 MED ORDER — ONDANSETRON HCL 4 MG/2ML IJ SOLN
4.0000 mg | Freq: Once | INTRAMUSCULAR | Status: AC
  Administered 2016-03-21: 4 mg via INTRAVENOUS
  Filled 2016-03-21: qty 2

## 2016-03-21 MED ORDER — POTASSIUM CHLORIDE 10 MEQ/100ML IV SOLN
10.0000 meq | INTRAVENOUS | Status: AC
  Administered 2016-03-21 (×2): 10 meq via INTRAVENOUS
  Filled 2016-03-21 (×2): qty 100

## 2016-03-21 MED ORDER — LORAZEPAM 2 MG/ML IJ SOLN
1.0000 mg | Freq: Three times a day (TID) | INTRAMUSCULAR | Status: DC
  Administered 2016-03-21 – 2016-03-22 (×2): 1 mg via INTRAVENOUS
  Filled 2016-03-21 (×2): qty 1

## 2016-03-21 MED ORDER — PROMETHAZINE HCL 25 MG/ML IJ SOLN
12.5000 mg | Freq: Once | INTRAMUSCULAR | Status: AC
  Administered 2016-03-21: 12.5 mg via INTRAVENOUS

## 2016-03-21 MED ORDER — HYDRALAZINE HCL 10 MG PO TABS
10.0000 mg | ORAL_TABLET | Freq: Three times a day (TID) | ORAL | Status: DC
  Administered 2016-03-21 – 2016-03-23 (×6): 10 mg via ORAL
  Filled 2016-03-21 (×6): qty 1

## 2016-03-21 MED ORDER — HYDRALAZINE HCL 20 MG/ML IJ SOLN
10.0000 mg | Freq: Once | INTRAMUSCULAR | Status: AC
  Administered 2016-03-21: 10 mg via INTRAVENOUS
  Filled 2016-03-21: qty 1

## 2016-03-21 MED ORDER — CARVEDILOL 25 MG PO TABS
25.0000 mg | ORAL_TABLET | Freq: Two times a day (BID) | ORAL | Status: DC
  Administered 2016-03-22 – 2016-03-23 (×4): 25 mg via ORAL
  Filled 2016-03-21 (×4): qty 1

## 2016-03-21 MED ORDER — SODIUM CHLORIDE 0.9 % IV BOLUS (SEPSIS)
1000.0000 mL | Freq: Once | INTRAVENOUS | Status: AC
  Administered 2016-03-21: 1000 mL via INTRAVENOUS

## 2016-03-21 NOTE — ED Provider Notes (Signed)
CSN: [id number]     Arrival date & time 03/21/16  1424 History   First MD Initiated Contact with Patient 03/21/16 1536     Chief Complaint  Patient presents with  . Chest Pain  . Back Pain  . Emesis     HPI  50 year old female with history of cyclical vomiting syndrome, pancreatitis, gastroparesis, marijuana smoking, prior MI in 2008 with catheterization and questionable stent in place, insulin-dependent type 2 diabetes, who presents with severe abdominal pain radiating to the chest and around to her back that began this morning. Patient reports that the abdominal pain is sharp and generalized. It is similar to pain that she's had before with her gastroparesis but today it radiates upward into her chest. The pain does not radiate straight through to her back but around to her side.  She denies pressure sensation in the chest, radiation to the jaw or arms, exertional component or pleuritic nature to the pain. Denies shortness of breath. She's had greater than 20 episodes of watery vomiting throughout the day today. Denies diarrhea. No blood in her vomit. She is very hypertensive to 210/110, though reports that she is not able to keep down her blood pressure medications during the day today. The pain in her back is accompanied by dysuria and frequency, patient reports that she's had similar symptoms with urinary tract infection in the past. Denies vaginal discharge, vaginal bleeding, vaginal pain, pelvic pain.  Past Medical History  Diagnosis Date  . PANCREATITIS 05/09/2008  . Cyclic vomiting syndrome   . Hypertension   . Complication of anesthesia     "problems waking up"  . Heart murmur   . Myocardial infarction [facility]) 07/2007    "they say I've had a silent one; I don't know"  . Pneumonia   . Anemia   . Blood transfusion   . GERD (gastroesophageal reflux disease)   . Anxiety   . Depression   . Smoker   . Marijuana smoker, continuous ([place])   . NEUROPATHY, DIABETIC 02/23/2007  . Type II  diabetes mellitus ([place])   . Diabetic gastroparesis ([place])     /e-chart  . Chronic kidney disease (CKD), stage III (moderate)   . Nausea & vomiting 06/10/2015  . Intestinal impaction [facility])    Past Surgical History  Procedure Laterality Date  . Port-a-cath placement  ~ 2008    right chest; "poor access; frequent hospitalizations"  . Laparotomy and lysis of adhesions    . Abdominal hysterectomy  02/2002  . Cholecystectomy  1980's  . Cardiac catheterization    . Dilation and evacuation  X3  . Left hand surgery    . Cataract extraction w/ intraocular lens  implant, bilateral    . Colonoscopy    . Upper gi endoscopy    . Cesarean section     Family History  Problem Relation Age of Onset  . Diabetes type II Mother   . Diabetes Mother   . Hypertension Mother   . Diabetes type II Sister   . Diabetes Sister   . Hypertension Sister   . Hypertension Brother    Social History  Substance Use Topics  . Smoking status: Current Some Day Smoker -- 0.50 packs/day for 22 years    Types: Cigarettes  . Smokeless tobacco: Never Used  . Alcohol Use: No   OB History    Gravida Para Term Preterm AB TAB SAB Ectopic Multiple Living   '7    3 3    ' 4  Review of Systems  Constitutional: Negative for fever, chills, activity change and appetite change.  HENT: Negative for congestion, rhinorrhea and sore throat.   Eyes: Negative for visual disturbance.  Respiratory: Negative for cough, shortness of breath and wheezing.   Cardiovascular: Positive for chest pain. Negative for palpitations and leg swelling.  Gastrointestinal: Positive for nausea, vomiting and abdominal pain. Negative for diarrhea, constipation, blood in stool and abdominal distention.  Genitourinary: Negative for dysuria, frequency and flank pain.  Musculoskeletal: Positive for back pain. Negative for myalgias, joint swelling, arthralgias, gait problem, neck pain and neck stiffness.  Skin: Negative for rash.  Neurological: Negative  for dizziness, tremors, syncope, facial asymmetry, speech difficulty, weakness, numbness and headaches.  Psychiatric/Behavioral: Negative for behavioral problems, confusion and agitation.      Allergies  Bee venom; Acetaminophen; Novolog; Other; Tegaderm ag mesh; and Erythromycin  Home Medications   Prior to Admission medications   Medication Sig Start Date End Date Taking? Authorizing Provider  blood glucose meter kit and supplies KIT Dispense based on patient and insurance preference. Use up to four times daily as directed. (FOR ICD-9 250.00, 250.01). 06/02/15  Yes Hilton Sinclair, MD  carvedilol (COREG) 25 MG tablet Take 1 tablet (25 mg total) by mouth 2 (two) times daily with a meal. 01/14/16  Yes Olam Idler, MD  hydrALAZINE (APRESOLINE) 10 MG tablet Take 1 tablet (10 mg total) by mouth 3 (three) times daily. 09/29/15  Yes Alyssa A Haney, MD  insulin glargine (LANTUS) 100 UNIT/ML injection Inject 0.11 mLs (11 Units total) into the skin daily. 12/17/15  Yes Virginia Crews, MD  lisinopril (PRINIVIL,ZESTRIL) 40 MG tablet Take 40 mg by mouth daily. 12/09/15  Yes Historical Provider, MD  LORazepam (ATIVAN) 0.5 MG tablet Take 1 tablet (0.5 mg total) by mouth every 8 (eight) hours as needed (severe nausea, vomiting). 01/21/16  Yes Sela Hua, MD  polyethylene glycol [facility] Floria Raveling) packet Take 17 g by mouth daily as needed. May increase in 2-3 days to 2 times daily, Until stooling regularly 06/02/15  Yes Hilton Sinclair, MD  pregabalin (LYRICA) 50 MG capsule TAKE 1 TABLET BY MOUTH AT BEDTIME AS NEEDED 01/14/16  Yes Olam Idler, MD  promethazine (PHENERGAN) 25 MG tablet Take 1 tablet (25 mg total) by mouth every 6 (six) hours as needed for nausea or vomiting. 12/04/15  Yes Leone Brand, MD  cetirizine (ZYRTEC) 10 MG tablet Take 1 tablet (10 mg total) by mouth daily. Patient not taking: Reported on 11/22/2015 06/20/15   Hilton Sinclair, MD  docusate sodium (COLACE) 100  MG capsule Take 1 capsule (100 mg total) by mouth 2 (two) times daily. Patient not taking: Reported on 12/16/2015 12/04/15   Leone Brand, MD  famotidine (PEPCID) 20 MG tablet Take 1 tablet (20 mg total) by mouth 2 (two) times daily. Patient not taking: Reported on 01/20/2016 12/17/15   Virginia Crews, MD  lidocaine (XYLOCAINE) 2 % solution Use as directed 20 mLs in the mouth or throat every 3 (three) hours as needed for mouth pain. Patient not taking: Reported on 01/20/2016 11/24/15   Sela Hua, MD  oxyCODONE (ROXICODONE) 5 MG immediate release tablet Take 1 tablet (5 mg total) by mouth every 4 (four) hours as needed for severe pain. 01/21/16   Sela Hua, MD  promethazine (PHENERGAN) 12.5 MG suppository Place 1 suppository (12.5 mg total) rectally every 6 (six) hours as needed for nausea. Patient not taking: Reported  on 01/23/2016 01/21/16   Sela Hua, MD   BP 155/69 mmHg  Pulse 98  Temp(Src) 98 F (36.7 C)  Resp 18  Ht '5\' 1"'  (1.549 m)  Wt 61.236 kg  BMI 25.52 kg/m2  SpO2 100% Physical Exam  Constitutional: She is oriented to person, place, and time. She appears well-developed and well-nourished. No distress.  HENT:  Head: Normocephalic and atraumatic.  Right Ear: External ear normal.  Left Ear: External ear normal.  Nose: Nose normal.  Mouth/Throat: Oropharynx is clear and moist. No oropharyngeal exudate.  Eyes: Conjunctivae and EOM are normal. Pupils are equal, round, and reactive to light. Right eye exhibits no discharge. Left eye exhibits no discharge.  Neck: Normal range of motion. Neck supple.  Cardiovascular: Regular rhythm, normal heart sounds and intact distal pulses.  Exam reveals no gallop and no friction rub.   No murmur heard. Tachycardic to 110 bpm  Pulmonary/Chest: Effort normal and breath sounds normal. No respiratory distress. She has no wheezes. She has no rales.  Abdominal: Soft. Bowel sounds are normal. She exhibits no distension and no mass.  There is tenderness (generalized). There is no rebound and no guarding.  Musculoskeletal: Normal range of motion. She exhibits tenderness (TTP over the bilateral perispinous muscles of the lumbar spine). She exhibits no edema.  Neurological: She is alert and oriented to person, place, and time. She exhibits normal muscle tone.  Skin: Skin is warm and dry. No rash noted. She is not diaphoretic.  Psychiatric: She has a normal mood and affect. Her behavior is normal. Judgment and thought content normal.    ED Course  Procedures (including critical care time) Labs Review Labs Reviewed  BASIC METABOLIC PANEL - Abnormal; Notable for the following:    CO2 19 (*)    Glucose, Bld 464 (*)    BUN 41 (*)    Creatinine, Ser 2.84 (*)    Calcium 10.6 (*)    GFR calc non Af Amer 18 (*)    GFR calc Af Amer 21 (*)    Anion gap 19 (*)    All other components within normal limits  CBC - Abnormal; Notable for the following:    WBC 15.0 (*)    All other components within normal limits  HEPATIC FUNCTION PANEL - Abnormal; Notable for the following:    Total Protein 8.7 (*)    All other components within normal limits  URINALYSIS, ROUTINE W REFLEX MICROSCOPIC (NOT AT [facility]) - Abnormal; Notable for the following:    Glucose, UA >1000 (*)    Hgb urine dipstick MODERATE (*)    Ketones, ur 15 (*)    Protein, ur >300 (*)    All other components within normal limits  URINE MICROSCOPIC-ADD ON - Abnormal; Notable for the following:    Squamous Epithelial / LPF 0-5 (*)    Bacteria, UA RARE (*)    Casts HYALINE CASTS (*)    All other components within normal limits  CBG MONITORING, ED - Abnormal; Notable for the following:    Glucose-Capillary 416 (*)    All other components within normal limits  I-STAT VENOUS BLOOD GAS, ED - Abnormal; Notable for the following:    pO2, Ven 30.0 (*)    Acid-base deficit 5.0 (*)    All other components within normal limits  CBG MONITORING, ED - Abnormal; Notable for the  following:    Glucose-Capillary 364 (*)    All other components within normal limits  URINE CULTURE  LIPASE, BLOOD  I-STAT TROPOININ, ED  Randolm Idol, ED    Imaging Review Dg Chest 2 View  03/21/2016  CLINICAL DATA:  50 year old female with 8 hours of centralized chest pain. Initial encounter. EXAM: CHEST  2 VIEW COMPARISON:  09/27/2015 and earlier. FINDINGS: AP and lateral views of the chest. Right subclavian approach porta cath remains in place. Lung volumes are within normal limits. Normal cardiac size and mediastinal contours. Visualized tracheal air column is within normal limits. No pneumothorax, pulmonary edema, pleural effusion or confluent pulmonary opacity. No acute osseous abnormality identified. Stable cholecystectomy clips. IMPRESSION: No acute cardiopulmonary abnormality. Electronically Signed   By: Genevie Ann M.D.   On: 03/21/2016 15:27   I have personally reviewed and evaluated these images and lab results as part of my medical decision-making.   EKG Interpretation   Date/Time:  Sunday March 21 2016 14:27:08 EDT Ventricular Rate:  97 PR Interval:  138 QRS Duration: 72 QT Interval:  344 QTC Calculation: 436 R Axis:   81 Text Interpretation:  Sinus rhythm with marked sinus arrhythmia Right  atrial enlargement Borderline ECG since last tracing no significant change  Confirmed by BELFI  MD, MELANIE ([id number]) on 03/21/2016 2:44:08 PM       EMERGENCY DEPARTMENT [place] ABD/AORTA EXAM Study: Limited Ultrasound of the Abdominal Aorta.  INDICATIONS:Abdominal pain and Back pain Indication: Multiple views of the abdominal aorta are obtained from the diaphragmatic hiatus to the aortic bifurcation in transverse and sagittal planes with a multi- Frequency probe.  PERFORMED BY: Myself  IMAGES ARCHIVED?: Yes  FINDINGS: Maximum aortic dimensions are 1.31 x1.56 cm  LIMITATIONS:  Abdominal pain  INTERPRETATION:  No abdominal aortic aneurysm  COMMENT:  Prox Ao dimensions:  1.48x1.30 cm, 2.30 cm sagitally                        Dist Ao dimensions: 1.31x1.56 cm, 2.42 cm sagitally   MDM   Final diagnoses:  Diabetic ketoacidosis without coma associated with type 2 diabetes mellitus ([place])  Hypertensive urgency  Gastroparesis   On arrival the patient is noted to be vomiting clear fluid. Abdomen is diffusely tender with no focality. No peritoneal signs. Pain in her lower back is reproducible with tenderness to palpation over the paraspinous muscles of the lumbar spine. What she initially described as chest pain is actually epigastric pain. EKG with no ischemic changes. Blood pressures are equal in the bilateral upper extremities. Bedside ultrasound performed according to the note above without evidence of AAA. Doubt aortic dissection or abdominal aortic aneurysm as the cause of her pain. Lipase WNL making pancreatitis unlikely.   Patient is noted to be hypertensive with blood pressure of 208/110 on arrival. She says she has thrown up her blood pressure medicine this morning. Suspect element of hypertensive urgency. She was given a dose of IV hydralazine as well as labetalol with improvement in her blood pressure to 150/70. Vomiting improved with Phenergan.  Labs reveal mild anion gap to 19, low bicarbonate of 19, mild acidosis. Ketones present in the urine and blood glucose elevated to greater than 400. Suspect mild DKA contributing to her nausea and abdominal pain. Patient started on insulin drip as well as boluses and IV fluids started in the emergency department. UA without indication of UTI, doubt pyelo.   Suspect DKA in conjunction with the patient's known hyperemesis and gastroparesis as the cause of her symptoms. Will admit to family medicine for further management of her symptoms. Patient  stable for transfer.       Lyndon Chenoweth Algernon Huxley, MD 03/21/16 1814  Wandra Arthurs, MD 03/23/16 [id number]

## 2016-03-21 NOTE — ED Notes (Signed)
Attempted report x1. 

## 2016-03-21 NOTE — ED Notes (Signed)
Pt c/o back pain, chest pain and vomiting onset today. Pt reports pain is worse in her back, Pain in chest radiates into abdomen.

## 2016-03-21 NOTE — H&P (Signed)
[facility] Admission History and Physical Service Pager: [phone]  Patient name: [name] [name] Medical record number: [id number] Date of birth: Aug 17, 1966 Age: 50 y.o. Gender: female  Primary Care Provider: Phill Myron, MD Consultants: none  Code Status: full per discussion on admission   Chief Complaint:   Assessment and Plan: [name] [name] is a 50 y.o. female presenting with nausea and vomiting. PMH is significant for cyclic vomiting syndrome, gastroparesis, marijuana abuse, depression, DM, HTN, CKD III.   #Nausea/Vomiting with abdominal pain:  Most likely multifactorial including mild DKA, gastroparesis, cyclic vomiting, and CKD stage III with AKI. Concerns for mild DKA given CBG in the 400s, mild acidosis, low bicarb at 19, anion gap of 19, and ketonuria; suspect this could be from only taking 11 units of Lantus (patient never titrated up after previous admission as she never checked CBGs at home).  Patient with longstanding history of cyclic vomiting associated with marijuana use (endorsed use of THC recently). Current episode consistent with past flares per the patient. Patient also with history of gastroparesis that is also contributing. Lipase normal. Abdominal exam with diffuse tenderness but no peritoneal signs. Back pain is significant per her report, however doubt aortic dissection given ultrasound in the ED and BPs equal in the upper extremities.  Doubt obstruction or other acute intra-abdominal pathology. Patient has refused amitriptyline in the past for her cyclic vomiting.  - Admit to SDU, attending Dr. [name]  - NPO  - insulin drip started in ED - K 4.1, repleted with potassium chloride 13mQ  - BMETS q2hrs until gap closes x 2. - mIVF NS 150 mL/hr until CBGs <250,  then D5- 1/2NS @ 12577mhr - phenergan 12.5 mg q6hrs IV - IV protonix  - Consider starting reglan or other pro-motility agent.  - Dilaudid 0.5 mg Q2hr PRN   -Scheduled  Ativan 77m50mid x 24 hours.  #AKI on CKD III: Cr 2.84 on admission, baseline ~1.8  - Hold home lisinopril, continue coreg and hydralazine  - mIVF as above - prn hydralazine   #HTN: she has a history of hypertensive blood pressure during exacerbations of her cyclic vomiting. Most likely her blood pressure will return to normal when she is through with the acute episode.  - hold home carvedilol 12m78md, hydralazine 10mg36m - Hold home lisinopril  - hydralazine 5 mg IV Q4 PRN SBP >190 or DBP >100 (I suspect with vomiting she will have some elevation in BPs)  #DM2 / Hyperglycemia: Blood glucose 416 on admission.She takes Lantus 11 units in the morning, but she was sick so she didn't take it today. Of note, patient was previously on 22units daily. Notes that she was decreased down to 11 units after last hospitalization and no one ever increased her dose (I suspect because the patient did not check her CBGs).  - NPO, holding home lantus  - SSI - CBG's   Depression/anxiety: Seems PCP has had an issue with patient following up with mental health. Patient has declined being on controller medications and gets frustrated that PCP will not prescribe Ativan. - continue to monitor   FEN/GI: NS 150 mL/hr, NPO  Prophylaxis: heparin subq   Disposition: admitted to FMTS for nausea, vomiting and abdominal pain   History of Present Illness:  [name] [name] 50 y.54 female presenting with abdominal pain, nausea, and vomiting.   The patient began to feel nauseated yesterday and then today around 4am she started having chest pain (  points to epigastrium), abdominal pain, and back pain began. She's vomited nonstop since this morning. Emesis is now clear, never bilious or bloody.  She has been unable tolerate PO. She's doing okay with a few ice chips. She had some diarrhea today that went from brown to clear per her report.   Her back pain is constant "tiring" pain in the lumbar to thoracic  region. She denies pulling or stabbing sensations. There is no radiation. Nothing makes it better or worse. Her abdominal pain is in the epigastric region. This pain is intermittent, however she cannot identify alleviating or aggravating factors. She denies chest pain right now.   She has 1 pill of diluadid left from Nov 2016 but didn't want to take it because she has not gotten any refills.   In the ED, her BP was 207/110. She was given hydralazine and IV labetalol for this. She was noted to be profusely vomiting during that time. Give concerns for DKA (CBGs in 400s, bicarb of 19 with an anion gap), she was given 2- 1 NS boluses and started on an insulin drip. Due to significant abdominal and back pain, a bedside US was done which revealed a normal aorta diameter without concerns for dissection. She was given Dilaudid 102m x 2 doses, Zofran, and phenergan.     Review Of Systems: Per HPI with the following additions: none Otherwise the remainder of the systems were negative.  Patient Active Problem List   Diagnosis Date Noted  . DKA (diabetic ketoacidoses) ([facility] 03/21/2016  . Non-intractable cyclical vomiting with nausea   . Emotional stress 12/30/2015  . Hyperkalemia 11/28/2015  . Elevated cholesterol 11/28/2015  . AKI (acute kidney injury) ([facility]   . Absolute anemia   . Hard palate ulcer   . Oral ulcer 11/22/2015  . Tooth ache   . Cystitis   . Vomiting 11/15/2015  . Gastroparesis 08/21/2015  . Hematuria 07/09/2015  . Blood in stool 06/23/2015  . Constipation 06/06/2015  . Onychomycosis 03/25/2015  . Encounter for chronic pain management 01/06/2015  . Vitamin D deficiency 11/28/2014  . At risk for polypharmacy 08/15/2014  . Other and unspecified hyperlipidemia 08/15/2014  . Leg cramping 07/18/2014  . Rotator cuff syndrome 04/19/2014  . Biceps tendinopathy of right upper extremity 04/05/2014  . Protein-calorie malnutrition, severe ([facility] [place] 12/17/2013  . Anemia 11/15/2013  . CKD  (chronic kidney disease) 08/17/2013  . Esophagitis 04/18/2013  . Preventative health care 09/18/2012  . High risk social situation 08/31/2012  . Allergic rhinitis, seasonal 05/04/2012  . Marijuana smoker ([facility] 11/03/2011  . Uncontrolled secondary diabetes mellitus with stage 3 CKD (GFR 30-59) ([facility] 10/23/2011  . Cyclic vomiting syndrome 08/03/2011  . Hot flashes 07/29/2011  . Backache 04/10/2007  . Depression with anxiety 02/23/2007  . Anxiety state 02/23/2007  . TOBACCO DEPENDENCE 02/23/2007  . MIGRAINE, UNSPEC., W/O INTRACTABLE MIGRAINE 02/23/2007  . Essential hypertension, malignant 02/23/2007  . INSOMNIA NOS 02/23/2007  . DM neuropathies ([facility] 02/23/2007  . GERD 01/27/2004    Past Medical History: Past Medical History  Diagnosis Date  . PANCREATITIS 05/09/2008  . Cyclic vomiting syndrome   . Hypertension   . Complication of anesthesia     "problems waking up"  . Heart murmur   . Myocardial infarction ([facility] 07/2007    "they say I've had a silent one; I don't know"  . Pneumonia   . Anemia   . Blood transfusion   . GERD (gastroesophageal reflux disease)   . Anxiety   .  Depression   . Smoker   . Marijuana smoker, continuous ([place])   . NEUROPATHY, DIABETIC 02/23/2007  . Type II diabetes mellitus ([place])   . Diabetic gastroparesis ([place])     /e-chart  . Chronic kidney disease (CKD), stage III (moderate)   . Nausea & vomiting 06/10/2015  . Intestinal impaction [facility])     Past Surgical History: Past Surgical History  Procedure Laterality Date  . Port-a-cath placement  ~ 2008    right chest; "poor access; frequent hospitalizations"  . Laparotomy and lysis of adhesions    . Abdominal hysterectomy  02/2002  . Cholecystectomy  1980's  . Cardiac catheterization    . Dilation and evacuation  X3  . Left hand surgery    . Cataract extraction w/ intraocular lens  implant, bilateral    . Colonoscopy    . Upper gi endoscopy    . Cesarean section      Social History: Social  History  Substance Use Topics  . Smoking status: Current Some Day Smoker -- 0.50 packs/day for 22 years    Types: Cigarettes  . Smokeless tobacco: Never Used  . Alcohol Use: No   Additional social history: patient smokes 1/2 ppd, denies alcohol use. Smokes marijuana.   Please also refer to relevant sections of EMR.  Family History: Family History  Problem Relation Age of Onset  . Diabetes type II Mother   . Diabetes Mother   . Hypertension Mother   . Diabetes type II Sister   . Diabetes Sister   . Hypertension Sister   . Hypertension Brother     Allergies and Medications: Allergies  Allergen Reactions  . Bee Venom Anaphylaxis  . Acetaminophen Nausea And Vomiting  . Novolog [Insulin Aspart] Other (See Comments)    Muscles in feet cramp  . Other     Sorbaview causes rash  . Tegaderm Ag Mesh [Silver]     Blisters  . Erythromycin Nausea And Vomiting   Current Facility-Administered Medications on File Prior to Encounter  Medication Dose Route Frequency Provider Last Rate Last Dose  . 0.9 %  sodium chloride infusion   Intravenous Continuous Kinnie Feil, MD 20 mL/hr at 04/19/13 1500 500 mL at 04/19/13 1500   Current Outpatient Prescriptions on File Prior to Encounter  Medication Sig Dispense Refill  . blood glucose meter kit and supplies KIT Dispense based on patient and insurance preference. Use up to four times daily as directed. (FOR ICD-9 250.00, 250.01). 1 each 0  . carvedilol (COREG) 25 MG tablet Take 1 tablet (25 mg total) by mouth 2 (two) times daily with a meal. 60 tablet 2  . hydrALAZINE (APRESOLINE) 10 MG tablet Take 1 tablet (10 mg total) by mouth 3 (three) times daily. 30 tablet 5  . insulin glargine (LANTUS) 100 UNIT/ML injection Inject 0.11 mLs (11 Units total) into the skin daily. 10 mL 0  . lisinopril (PRINIVIL,ZESTRIL) 40 MG tablet Take 40 mg by mouth daily.  0  . LORazepam (ATIVAN) 0.5 MG tablet Take 1 tablet (0.5 mg total) by mouth every 8 (eight) hours  as needed (severe nausea, vomiting). 12 tablet 0  . polyethylene glycol (MIRALAX / GLYCOLAX) packet Take 17 g by mouth daily as needed. May increase in 2-3 days to 2 times daily, Until stooling regularly 30 packet prn  . pregabalin (LYRICA) 50 MG capsule TAKE 1 TABLET BY MOUTH AT BEDTIME AS NEEDED 30 capsule 1  . promethazine (PHENERGAN) 25 MG tablet Take 1 tablet (25  mg total) by mouth every 6 (six) hours as needed for nausea or vomiting. 60 tablet 2  . cetirizine (ZYRTEC) 10 MG tablet Take 1 tablet (10 mg total) by mouth daily. (Patient not taking: Reported on 11/22/2015) 30 tablet 11  . docusate sodium (COLACE) 100 MG capsule Take 1 capsule (100 mg total) by mouth 2 (two) times daily. (Patient not taking: Reported on 12/16/2015) 30 capsule 0  . famotidine (PEPCID) 20 MG tablet Take 1 tablet (20 mg total) by mouth 2 (two) times daily. (Patient not taking: Reported on 01/20/2016) 60 tablet 0  . lidocaine (XYLOCAINE) 2 % solution Use as directed 20 mLs in the mouth or throat every 3 (three) hours as needed for mouth pain. (Patient not taking: Reported on 01/20/2016) 100 mL 3  . oxyCODONE (ROXICODONE) 5 MG immediate release tablet Take 1 tablet (5 mg total) by mouth every 4 (four) hours as needed for severe pain. 12 tablet 0  . promethazine (PHENERGAN) 12.5 MG suppository Place 1 suppository (12.5 mg total) rectally every 6 (six) hours as needed for nausea. (Patient not taking: Reported on 01/23/2016) 12 each 0    Objective: BP 153/75 mmHg  Pulse 104  Temp(Src) 98.9 F (37.2 C) (Oral)  Resp 18  Ht _0  (1.549 m)  Wt 138 lb 3.2 oz (62.687 kg)  BMI 26.13 kg/m2  SpO2 100% Exam: General: Lying in bed in NAD. Non-toxic initially, however intermittently will start shaking, at other times, will sit up quickly into a sitting position without any noticeable pain.  Eyes: Conjunctivae non-injected.  ENTM: Moist mucous membranes, tongue somewhat dry. Oropharynx clear. No nasal discharge.  Neck: Supple, no  LAD Cardiovascular: RRR. No murmurs, rubs, or gallops noted. No pitting edema noted. Respiratory: No increased WOB. CTAB without wheezing, rhonchi, or crackles noted. Abdomen: +BS, soft, non-distended, palpated with stethoscope without pain, however patient then began retching. After that, generalized tenderness on exam without rebound or guarding.  MSK: Normal bulk and noted. No gross deformities noted. No swelling, erythema, or ecchymoses over the back. No pain over the spinous processes or drop offs. Patient actually endorses improvement in pain with my palpation.  Skin: No rashes noted  Neuro: A&O x4. No gross neurologic deficits  Psych:  Appropriate mood and affect.    Labs and Imaging: CBC BMET   Recent Labs Lab 03/21/16 1458  WBC 15.0*  HGB 12.9  HCT 38.3  PLT 267    Recent Labs Lab 03/21/16 1458  NA 141  K 4.1  CL 103  CO2 19*  BUN 41*  CREATININE 2.84*  GLUCOSE 464*  CALCIUM 10.6*     Venous: pH 7.26, pCO2 49.5, pO2 30.0 i trop 0.05 > 0.00   Dg Chest 2 View  03/21/2016  CLINICAL DATA:  50 year old female with 8 hours of centralized chest pain. Initial encounter. EXAM: CHEST  2 VIEW COMPARISON:  09/27/2015 and earlier. FINDINGS: AP and lateral views of the chest. Right subclavian approach porta cath remains in place. Lung volumes are within normal limits. Normal cardiac size and mediastinal contours. Visualized tracheal air column is within normal limits. No pneumothorax, pulmonary edema, pleural effusion or confluent pulmonary opacity. No acute osseous abnormality identified. Stable cholecystectomy clips. IMPRESSION: No acute cardiopulmonary abnormality. Electronically Signed   By: Genevie Ann M.D.   On: 03/21/2016 15:27     Archie Patten, MD 03/21/2016, 9:41 PM PGY-2, [place] Intern pager: [phone], text pages welcome

## 2016-03-22 DIAGNOSIS — E1110 Type 2 diabetes mellitus with ketoacidosis without coma: Secondary | ICD-10-CM | POA: Insufficient documentation

## 2016-03-22 DIAGNOSIS — R1084 Generalized abdominal pain: Secondary | ICD-10-CM

## 2016-03-22 DIAGNOSIS — K3184 Gastroparesis: Secondary | ICD-10-CM

## 2016-03-22 DIAGNOSIS — F1210 Cannabis abuse, uncomplicated: Secondary | ICD-10-CM

## 2016-03-22 DIAGNOSIS — I160 Hypertensive urgency: Secondary | ICD-10-CM

## 2016-03-22 DIAGNOSIS — E1310 Other specified diabetes mellitus with ketoacidosis without coma: Principal | ICD-10-CM

## 2016-03-22 DIAGNOSIS — N179 Acute kidney failure, unspecified: Secondary | ICD-10-CM | POA: Insufficient documentation

## 2016-03-22 DIAGNOSIS — F1290 Cannabis use, unspecified, uncomplicated: Secondary | ICD-10-CM | POA: Insufficient documentation

## 2016-03-22 LAB — BASIC METABOLIC PANEL
Anion gap: 10 (ref 5–15)
BUN: 38 mg/dL — ABNORMAL HIGH (ref 6–20)
CHLORIDE: 110 mmol/L (ref 101–111)
CO2: 20 mmol/L — AB (ref 22–32)
CREATININE: 2.35 mg/dL — AB (ref 0.44–1.00)
Calcium: 9 mg/dL (ref 8.9–10.3)
GFR calc non Af Amer: 23 mL/min — ABNORMAL LOW (ref >60)
GFR, EST AFRICAN AMERICAN: 26 mL/min — AB (ref >60)
GLUCOSE: 168 mg/dL — AB (ref 65–99)
Potassium: 3.8 mmol/L (ref 3.5–5.1)
Sodium: 140 mmol/L (ref 135–145)

## 2016-03-22 LAB — URINE CULTURE

## 2016-03-22 LAB — GLUCOSE, CAPILLARY
GLUCOSE-CAPILLARY: 148 mg/dL — AB (ref 65–99)
GLUCOSE-CAPILLARY: 138 mg/dL — AB (ref 65–99)
GLUCOSE-CAPILLARY: 143 mg/dL — AB (ref 65–99)
GLUCOSE-CAPILLARY: 94 mg/dL (ref 65–99)
GLUCOSE-CAPILLARY: 250 mg/dL — AB (ref 65–99)
Glucose-Capillary: 178 mg/dL — ABNORMAL HIGH (ref 65–99)
Glucose-Capillary: 300 mg/dL — ABNORMAL HIGH (ref 65–99)
Glucose-Capillary: 135 mg/dL — ABNORMAL HIGH (ref 65–99)
Glucose-Capillary: 149 mg/dL — ABNORMAL HIGH (ref 65–99)

## 2016-03-22 LAB — HEMOGLOBIN A1C
Hgb A1c MFr Bld: 8.1 % — ABNORMAL HIGH (ref 4.8–5.6)
MEAN PLASMA GLUCOSE: 186 mg/dL

## 2016-03-22 MED ORDER — PROMETHAZINE HCL 25 MG PO TABS
12.5000 mg | ORAL_TABLET | Freq: Four times a day (QID) | ORAL | Status: DC | PRN
  Administered 2016-03-22 – 2016-03-23 (×2): 12.5 mg via ORAL
  Filled 2016-03-22 (×2): qty 1

## 2016-03-22 MED ORDER — INSULIN GLARGINE 100 UNIT/ML ~~LOC~~ SOLN
7.0000 [IU] | Freq: Every day | SUBCUTANEOUS | Status: DC
  Filled 2016-03-22: qty 0.07

## 2016-03-22 MED ORDER — TRAMADOL HCL 50 MG PO TABS
50.0000 mg | ORAL_TABLET | Freq: Four times a day (QID) | ORAL | Status: DC | PRN
  Administered 2016-03-22: 50 mg via ORAL
  Filled 2016-03-22: qty 1

## 2016-03-22 MED ORDER — INSULIN GLARGINE 100 UNIT/ML ~~LOC~~ SOLN
5.0000 [IU] | Freq: Every day | SUBCUTANEOUS | Status: DC
Start: 2016-03-22 — End: 2016-03-23
  Administered 2016-03-22 (×2): 5 [IU] via SUBCUTANEOUS
  Filled 2016-03-22 (×3): qty 0.05

## 2016-03-22 MED ORDER — INSULIN ASPART 100 UNIT/ML ~~LOC~~ SOLN: 0.0000 [IU] | Freq: Every day | SUBCUTANEOUS | Status: DC

## 2016-03-22 MED ORDER — TRAMADOL HCL 50 MG PO TABS
50.0000 mg | ORAL_TABLET | ORAL | Status: DC | PRN
  Administered 2016-03-22 – 2016-03-23 (×4): 50 mg via ORAL
  Filled 2016-03-22 (×4): qty 1

## 2016-03-22 MED ORDER — SODIUM CHLORIDE 0.9% FLUSH
10.0000 mL | INTRAVENOUS | Status: DC | PRN
Start: 2016-03-22 — End: 2016-03-23
  Administered 2016-03-22 – 2016-03-23 (×2): 10 mL
  Filled 2016-03-22: qty 40

## 2016-03-22 MED ORDER — INSULIN ASPART 100 UNIT/ML ~~LOC~~ SOLN
0.0000 [IU] | Freq: Three times a day (TID) | SUBCUTANEOUS | Status: DC
  Administered 2016-03-22: 1 [IU] via SUBCUTANEOUS
  Administered 2016-03-22: 3 [IU] via SUBCUTANEOUS
  Administered 2016-03-23 (×2): 1 [IU] via SUBCUTANEOUS

## 2016-03-22 MED ORDER — FAMOTIDINE 20 MG PO TABS
20.0000 mg | ORAL_TABLET | Freq: Two times a day (BID) | ORAL | Status: DC
  Administered 2016-03-22 – 2016-03-23 (×3): 20 mg via ORAL
  Filled 2016-03-22 (×3): qty 1

## 2016-03-22 NOTE — Progress Notes (Signed)
Family Medicine Teaching Service Daily Progress Note Intern Pager: [phone]  Patient name: [name] [name] Medical record number: [id number] Date of birth: May 08, 1966 Age: 50 y.o. Gender: female  Primary Care Provider: Phill Myron, MD Consultants: none Code Status: FULL  Pt Overview and Major Events to Date:  3/26:   Assessment and Plan: [name] [name] is a 50 y.o. female presenting with nausea and vomitinga x 2 days. PMH is significant for cyclic vomiting syndrome, gastroparesis, marijuana abuse, depression, DM, HTN, CKD III.   #Nausea/Vomiting with abdominal pain, multifactorial: Most likely multifactorial including mild DKA, gastroparesis, cyclic vomiting associated with marijuana use (current episode consistent with past flares per the patient), and CKD stage III with AKI. Patient with longstanding history of cyclic vomiting associated with marijuana use (endorsed use of THC recently). AG closed x 2 overnight.CBGs 143-148 after transition. Symptoms improved.  - transitioned off insulin drip to Lantus 5 units at bedtime; will continue to follow CBGs - phenergan 12.5 mg q6hrsPO - Consider starting reglan or other pro-motility agent.  - discontinued Dilaudid; scheduled Tramadol 50 q 6hr PRN - advance to soft diet   #AKI on CKD III: Cr 2.84 >>2.35 on admission, baseline ~1.8  - Hold home lisinopril, continue coreg and hydralazine  - mIVF as above - prn hydralazine   #HTN, stable: she has a history of hypertensive blood pressure during exacerbations of her cyclic vomiting. Most likely her blood pressure will return to normal when she is through with the acute episode.  - Carvedilol 25mg  bid - Hold home lisinopril  - hydralazine 10mg  TID   #DM2 / Hyperglycemia: Blood glucose 416 on admission.She takes Lantus 11 units in the morning. Of note, patient was previously on 22units daily. Notes that she was decreased down to 11 units after last hospitalization and no one ever  increased her dose (suspect because the patient did not check her CBGs).  - Lantus 5 units -SSI sensitive - CBG ACHS   Back Pain: Likely musculoskeletal. Tenderness in left lumbar paraspinal muscles and exam is inconsistent - K pad - will continue to monitor  Depression/anxiety: Seems PCP has had an issue with patient following up with mental health. Patient has declined being on controller medications and gets frustrated that PCP will not prescribe Ativan. - continue to monitor  FEN/GI: NS 50 mL/hr, soft diet   Prophylaxis: heparin subq   Disposition: pending management of DKA   Subjective:  Reports improved abdominal pain and nausea. Controlled with medications. Had coffee, orange juice, water, but would like to eat this morning. Is willing to try soft diet. Reports back pain started over 1 month ago; gradual lower back pain. Thought it was a UTI so had been drinking cranberry juice and water. No dysuria.   Objective: Temp:  [98 F (36.7 C)-98.9 F (37.2 C)] 98.4 F (36.9 C) (03/27 0500) Pulse Rate:  [87-119] 87 (03/27 0037) Resp:  [13-25] 13 (03/27 0037) BP: (123-208)/(49-121) 125/71 mmHg (03/27 0500) SpO2:  [94 %-100 %] 100 % (03/27 0500) Weight:  [61.236 kg (135 lb)-64.547 kg (142 lb 4.8 oz)] 64.547 kg (142 lb 4.8 oz) (03/27 0500) Physical Exam: General: NAD  Cardiovascular: RRR, no m/r/g Respiratory: normal effort, CTAB Abdomen: soft, reports some tenderness with palpation in epigastric region and suprapubic region, no guarding, no rebound  Extremities: no LE edema or tendernes  Laboratory:  Recent Labs Lab 03/21/16 1458  WBC 15.0*  HGB 12.9  HCT 38.3  PLT 267    Recent Labs Lab  03/21/16 1458 03/21/16 2045 03/21/16 2247 03/22/16 0346  NA 141 149* 143 140  K 4.1 3.7 4.4 3.8  CL 103 115* 114* 110  CO2 19* 20* 17* 20*  BUN 41* 40* 40* 38*  CREATININE 2.84* 2.65* 2.38* 2.35*  CALCIUM 10.6* 9.8 9.1 9.0  PROT 8.7*  --   --   --   BILITOT 1.0  --   --    --   ALKPHOS 92  --   --   --   ALT 18  --   --   --   AST 25  --   --   --   GLUCOSE 464* 163* 159* 168*   On admission, Venous: pH 7.26, pCO2 49.5, pO2 30.0 i trop 0.05 > 0.00      Imaging/Diagnostic Tests: Dg Chest 2 View 03/21/2016 IMPRESSION: No acute cardiopulmonary abnormality. Electronically Signed By: Genevie Ann M.D. On: 03/21/2016 15:27   Smiley Houseman, MD 03/22/2016, 7:18 AM PGY-1, [place] Intern pager: [phone], text pages welcome

## 2016-03-22 NOTE — Plan of Care (Signed)
Problem: Activity: Goal: Ability to perform activities of daily living will improve Outcome: Completed/Met Date Met:  03/22/16 Pt able to complete adls Goal: Will verbalize the importance of balancing activity with adequate rest periods Outcome: Completed/Met Date Met:  03/22/16 Pt understands importance of balancing activity with rest periods  Problem: Education: Goal: Ability to describe self-care measures that may prevent or decrease complications (Diabetes Survival Skills Education) will improve Outcome: Completed/Met Date Met:  03/22/16 Pt able to describe self care measures that may prevent future complications   Problem: Fluid Volume: Goal: Will maintain adequate fluid volume Outcome: Completed/Met Date Met:  03/22/16 Pt able to tolerate adequate fluid volume   Problem: Metabolic: Goal: Ability to maintain appropriate glucose levels will improve Outcome: Completed/Met Date Met:  03/22/16 Pt currently able to maintain appropriate glucose levels  Goal: Diagnostic test results will improve Outcome: Completed/Met Date Met:  03/22/16 Diagnostic tests are improving   Problem: Physical Regulation: Goal: Will regain or maintain usual level of consciousness Outcome: Completed/Met Date Met:  03/22/16 Pt able to maintain usual LOC Goal: Will not experience complications related to bowel motility Outcome: Completed/Met Date Met:  99/23/41 Pt has no complications related to bowel motility  Goal: Pain level will decrease Outcome: Progressing Pt has chronic back pain  Goal: Will remain free from infection Outcome: Completed/Met Date Met:  03/22/16 Pt remains free from infection at this time

## 2016-03-22 NOTE — Progress Notes (Signed)
Interval Note  [name] [name]'s anion gap has been closed x 2 now. She is not eating. Will place her on full liquids (also suffering from gastroparesis/cyclic vomiting flare). Patient to be given Lantus 5 units (<2/3 of previously low Lantus dose given she's not taking good PO).  RN to overlap the Lantus with the insulin gtt for 1-2 hours. After that discontinue D5-1/2NS fluids.  Can restart normal saline at 150cc/hr at that time. Can also go to checking CBGs ACHS (not q1hr). I have d/c'd BMETs q2hrs and the next one is to be done tomorrow AM.   Discussed with RN.   Archie Patten, MD [facility] [place] [facility] Family Medicine Resident  03/22/2016, 12:07 AM

## 2016-03-22 NOTE — Progress Notes (Addendum)
Inpatient Diabetes Program Recommendations  AACE/ADA: New Consensus Statement on Inpatient Glycemic Control (2015)  Target Ranges:  Prepandial:   less than 140 mg/dL      Peak postprandial:   less than 180 mg/dL (1-2 hours)      Critically ill patients:  140 - 180 mg/dL   Review of Glycemic Control Results for [name] [name], [name] [name] (MRN [id number]) as of 03/22/2016 10:25  Ref. Range 03/22/2016 00:56 03/22/2016 02:06 03/22/2016 03:04 03/22/2016 04:09 03/22/2016 08:00  Glucose-Capillary Latest Ref Range: 65-99 mg/dL 178 (H) 148 (H) 138 (H) 143 (H) 94  Results for [name] [name], [name] [name] (MRN [id number]) as of 03/22/2016 10:25  Ref. Range 10/15/2015 11:07 12/16/2015 17:50 03/21/2016 14:58  Hemoglobin A1C Latest Ref Range: 4.8-5.6 % 9.6 11.1 (H) 8.1 (H)   Diabetes history: DM Type 2 Outpatient Diabetes medications: Lantus 11 units @ hs Current orders for Inpatient glycemic control: Lantus 5 units @ hs + Novolog SENS correction scale 0-9 tid.  Inpatient Diabetes Program Recommendations:  Noted patient has not been checking CBGs @ home. Patient was counseled in January 2017 by Diabetes Coordinator on importance on glucose management. Spoke with RN and patient is now eating well. Please consider meal coverage Novolog insulin 3 units tid with meals (if eats >50%).  Nani Gasser Marlia Schewe, RN, MSN, CDE Inpatient Glycemic Control Team Team Pager [phone] (8am-5pm) 03/22/2016 10:31 AM

## 2016-03-23 DIAGNOSIS — E1165 Type 2 diabetes mellitus with hyperglycemia: Secondary | ICD-10-CM

## 2016-03-23 DIAGNOSIS — E118 Type 2 diabetes mellitus with unspecified complications: Secondary | ICD-10-CM | POA: Insufficient documentation

## 2016-03-23 LAB — CBC
HCT: 28.8 % — ABNORMAL LOW (ref 36.0–46.0)
Hemoglobin: 9.5 g/dL — ABNORMAL LOW (ref 12.0–15.0)
MCH: 29.9 pg (ref 26.0–34.0)
MCHC: 33 g/dL (ref 30.0–36.0)
MCV: 90.6 fL (ref 78.0–100.0)
PLATELETS: 182 10*3/uL (ref 150–400)
RBC: 3.18 MIL/uL — ABNORMAL LOW (ref 3.87–5.11)
RDW: 14.4 % (ref 11.5–15.5)
WBC: 6.5 10*3/uL (ref 4.0–10.5)

## 2016-03-23 LAB — BASIC METABOLIC PANEL
Anion gap: 7 (ref 5–15)
BUN: 25 mg/dL — AB (ref 6–20)
CHLORIDE: 109 mmol/L (ref 101–111)
CO2: 22 mmol/L (ref 22–32)
CREATININE: 1.87 mg/dL — AB (ref 0.44–1.00)
Calcium: 8.7 mg/dL — ABNORMAL LOW (ref 8.9–10.3)
GFR calc Af Amer: 35 mL/min — ABNORMAL LOW (ref >60)
GFR, EST NON AFRICAN AMERICAN: 30 mL/min — AB (ref >60)
Glucose, Bld: 123 mg/dL — ABNORMAL HIGH (ref 65–99)
Potassium: 3.7 mmol/L (ref 3.5–5.1)
SODIUM: 138 mmol/L (ref 135–145)

## 2016-03-23 LAB — GLUCOSE, CAPILLARY
GLUCOSE-CAPILLARY: 234 mg/dL — AB (ref 65–99)
Glucose-Capillary: 124 mg/dL — ABNORMAL HIGH (ref 65–99)
Glucose-Capillary: 124 mg/dL — ABNORMAL HIGH (ref 65–99)

## 2016-03-23 MED ORDER — LISINOPRIL 40 MG PO TABS: 40.0000 mg | ORAL_TABLET | Freq: Every day | ORAL | Status: DC

## 2016-03-23 MED ORDER — LOSARTAN POTASSIUM 50 MG PO TABS
100.0000 mg | ORAL_TABLET | Freq: Every day | ORAL | Status: DC
  Administered 2016-03-23: 100 mg via ORAL
  Filled 2016-03-23: qty 2

## 2016-03-23 MED ORDER — DICLOFENAC SODIUM 1 % TD GEL: 2.0000 g | Freq: Four times a day (QID) | TRANSDERMAL | Status: DC

## 2016-03-23 MED ORDER — INSULIN GLARGINE 100 UNIT/ML ~~LOC~~ SOLN: 5.0000 [IU] | Freq: Every day | SUBCUTANEOUS | Status: DC

## 2016-03-23 MED ORDER — LOSARTAN POTASSIUM 100 MG PO TABS: 100.0000 mg | ORAL_TABLET | Freq: Every day | ORAL | Status: DC

## 2016-03-23 MED ORDER — HEPARIN SOD (PORK) LOCK FLUSH 100 UNIT/ML IV SOLN
500.0000 [IU] | INTRAVENOUS | Status: AC | PRN
  Administered 2016-03-23: 500 [IU]

## 2016-03-23 NOTE — Discharge Summary (Signed)
[facility] Discharge Summary  Patient name: [name] [name] Medical record number: [id number] Date of birth: [birth year] Age: 50 y.o. Gender: female Date of Admission: 03/21/2016  Date of Discharge: 03/23/16 Admitting Physician: Dickie La, MD  Primary Care Provider: Phill Myron, MD Consultants: none  Indication for Hospitalization: N/V/ abdominal pain, mild DKA  Discharge Diagnoses/Problem List:  Mild DKA with elevated CBG, low pH and elevated AG AKI on CKDIII HTN DM2  Disposition: home  Discharge Condition: improved and stable  Discharge Exam: Please refer to progress not from day of discharge   Brief Hospital Course:  [name] [name] is a 50 y.o. female presenting with nausea, vomiting, and abdominal pain. PMH is significant for cyclic vomiting syndrome, gastroparesis, marijuana abuse, depression, DM, HTN, CKD III.  Patient's symptoms started the day prior to presentation. She was not able to tolerate any PO intake other than ice chips.   Nausea, Vomiting, Abdominal Pain  Thought to be multifactorial including mild DKA/Hyperglycemia, gastroparesis, cyclic vomiting, and CKD stage III with AKI. Patient with longstanding history of cyclic vomiting associated with marijuana use (endorsed use of THC recently). This episode was consistent with past flares per the patient. Concerns for mild DKA vs Hyperglycemia given CBG in the 400s, mild acidosis, low bicarb at 19, anion gap of 19, and ketonuria. On admission, CBG in the 400s, mild acidosis, low bicarb at 19, anion gap of 19, and ketonuria. Lipase was normal. Patient's symptoms resolved with pain medication and anti-nausea medication. Patient was not discharged with Ativan or Opioid medication.   Hyperglycemia:  Of note, patient took Lantus 11 units in the morning prior to admission. Additionally, patient was previously on 22units daily. Notes that she was decreased down to 11 units after last  hospitalization and no one ever increased her dose (suspect because the patient did not check her CBGs). Patient was started on an insulin drip and then transitioned to Lantus. She required 5 units of Lantus while in the hospital. Patient was discharged with Lantus 5 units. Patient was made a close follow up visit for titration.   AKI on CKD III:  Creatinine was 2.84 on admission, which improved to 1.87 (baseline ~1.8) with IVF and once patient could tolerate PO intake.   HTN:  Patient's blood pressure was elevated through out hospital course. Her Lisinopril was initially held due to AKI. Upon restarting on day of admission, patient reported nausea with this medication. Therefore she was switched to Losartan 180m daily.  Chronic Back Pain/Buttock Pain:   Patient reports of "knot" on her right buttock that increases in size when it gets inflamed; pain radiates to her right paraspinal area of her lumbar spine. These symptoms started about 1 month ago; no history of trauma to her knowledge. No aggravating/alleviating factors. Attending was able to palpate a small < 124mby 4m53module on her left sacroiliac area with mild tenderness to palpation. She also had mild right paraspinal muscle tenderness. She was instructed to do heating pads, tylenol and/or voltaren gell to start with conservative management. Consider [place] evaluate nodule and hip x-ray.   Normocytic Anemia: hgb 9.5 from 12.9 on admission. Likely dilutional as all cell lines decreased. Baseline ~ 10-11. Has been in 9s prior to this.  Issues for Follow Up:  - follow up cbg, as patient will likely need her Lantus increased - would recommend [place] evaluate nodule in left sacroiliac area and consider hip x-ray  - follow up blood pressure -  consider repeat BMP to follow creatinine  - consider CBC to follow up hemoglobin; no colonoscopy per chart review  Significant Procedures: none  Significant Labs and Imaging:   Recent Labs Lab  03/21/16 1458 03/23/16 0535  WBC 15.0* 6.5  HGB 12.9 9.5*  HCT 38.3 28.8*  PLT 267 182    Recent Labs Lab 03/21/16 1458 03/21/16 2045 03/21/16 2247 03/22/16 0346 03/23/16 0535  NA 141 149* 143 140 138  K 4.1 3.7 4.4 3.8 3.7  CL 103 115* 114* 110 109  CO2 19* 20* 17* 20* 22  GLUCOSE 464* 163* 159* 168* 123*  BUN 41* 40* 40* 38* 25*  CREATININE 2.84* 2.65* 2.38* 2.35* 1.87*  CALCIUM 10.6* 9.8 9.1 9.0 8.7*  ALKPHOS 92  --   --   --   --   AST 25  --   --   --   --   ALT 18  --   --   --   --   ALBUMIN 4.9  --   --   --   --   Venous: pH 7.26, pCO2 49.5, pO2 30.0 i trop 0.05 > 0.00  Lipase 33  CXR: Dg Chest 2 View  03/21/2016 CLINICAL DATA: 50 year old female with 8 hours of centralized chest pain. Initial encounter. EXAM: CHEST 2 VIEW COMPARISON: 09/27/2015 and earlier. FINDINGS: AP and lateral views of the chest. Right subclavian approach porta cath remains in place. Lung volumes are within normal limits. Normal cardiac size and mediastinal contours. Visualized tracheal air column is within normal limits. No pneumothorax, pulmonary edema, pleural effusion or confluent pulmonary opacity. No acute osseous abnormality identified. Stable cholecystectomy clips. IMPRESSION: No acute cardiopulmonary abnormality. Electronically Signed By: Genevie Ann M.D. On: 03/21/2016 15:27     Results/Tests Pending at Time of Discharge: none  Discharge Medications:    Medication List    STOP taking these medications        cetirizine 10 MG tablet  Commonly known as:  ZYRTEC     docusate sodium 100 MG capsule  Commonly known as:  COLACE     famotidine 20 MG tablet  Commonly known as:  PEPCID     lidocaine 2 % solution  Commonly known as:  XYLOCAINE     lisinopril 40 MG tablet  Commonly known as:  PRINIVIL,ZESTRIL     LORazepam 0.5 MG tablet  Commonly known as:  ATIVAN     oxyCODONE 5 MG immediate release tablet  Commonly known as:  ROXICODONE      TAKE these  medications        blood glucose meter kit and supplies Kit  Dispense based on patient and insurance preference. Use up to four times daily as directed. (FOR ICD-9 250.00, 250.01).     carvedilol 25 MG tablet  Commonly known as:  COREG  Take 1 tablet (25 mg total) by mouth 2 (two) times daily with a meal.     diclofenac sodium 1 % Gel  Commonly known as:  VOLTAREN  Apply 2-4 g topically 4 (four) times daily. Apply over affected area up to seven days     hydrALAZINE 10 MG tablet  Commonly known as:  APRESOLINE  Take 1 tablet (10 mg total) by mouth 3 (three) times daily.     insulin glargine 100 UNIT/ML injection  Commonly known as:  LANTUS  Inject 0.05 mLs (5 Units total) into the skin daily.     losartan 100 MG tablet  Commonly known as:  COZAAR  Take 1 tablet (100 mg total) by mouth daily.     polyethylene glycol packet  Commonly known as:  MIRALAX / GLYCOLAX  Take 17 g by mouth daily as needed. May increase in 2-3 days to 2 times daily, Until stooling regularly     pregabalin 50 MG capsule  Commonly known as:  LYRICA  TAKE 1 TABLET BY MOUTH AT BEDTIME AS NEEDED     promethazine 25 MG tablet  Commonly known as:  PHENERGAN  Take 1 tablet (25 mg total) by mouth every 6 (six) hours as needed for nausea or vomiting.     promethazine 12.5 MG suppository  Commonly known as:  PHENERGAN  Place 1 suppository (12.5 mg total) rectally every 6 (six) hours as needed for nausea.        Discharge Instructions: Please refer to Patient Instructions section of EMR for full details.  Patient was counseled important signs and symptoms that should prompt return to medical care, changes in medications, dietary instructions, activity restrictions, and follow up appointments.   Follow-Up Appointments: Follow-up Information    Follow up with Phill Myron, MD On 03/25/2016.   Specialty:  Family Medicine   Why:  at    Contact information:   [place]  [id number] [phone]       Please follow up.   Why:  9:30      Smiley Houseman, MD 03/23/2016, 9:53 PM PGY-1, [place]

## 2016-03-23 NOTE — Discharge Instructions (Signed)
You were admitted for nausea, vomiting, and abdominal pain. Your sugar was also elevated on admission. Your symptoms and sugar improved over the hospitalization.   You also noted about right buttock and low back pain. First line treatment for this is conservative management with heating pad and tylenol. If you do not tolerate tylenol, we have prescribed you voltaren gel; apply it to the affected area up to 4 times a day for up to 7 days total.    We discharged you on Lantus 5 units daily at bedtime. This is significantly lower than what you have been needing at home; we did not start you on your home dose to avoid risk of lowering your sugar too much. For this reason, we have made a close follow up appointment with your primary care provider soon on 3/30 at 9:30AM. Please keep this appointment.   Your blood pressure was also elevated on the day of discharge. You mentioned your home Lisinopril made you nauseous, therefore we started you on a different blood pressure medication and discontinued your Lisinopril.

## 2016-03-23 NOTE — Progress Notes (Signed)
Family Medicine Teaching Service Daily Progress Note Intern Pager: [phone]  Patient name: [name] [name] Medical record number: [id number] Date of birth: 02-26-1966 Age: 50 y.o. Gender: female  Primary Care Provider: Phill Myron, MD Consultants: none Code Status: FULL  Pt Overview and Major Events to Date:  3/26: admitted for N/V/abdominal pain, mild DKA   Assessment and Plan: [name] [name] is a 50 y.o. female presenting with nausea and vomitinga x 2 days; multifactorial cause. PMH is significant for cyclic vomiting syndrome, gastroparesis, marijuana abuse, depression, DM, HTN, CKD III.   #Nausea/Vomiting with abdominal pain, multifactorial and resolved: Most likely multifactorial including mild DKA, gastroparesis, cyclic vomiting associated with marijuana use (current episode consistent with past flares per the patient), and CKD stage III with AKI. Patient with longstanding history of cyclic vomiting associated with marijuana use (endorsed use of THC recently). Symptoms improved; tolerating soft diet.  - insulin regimen as below - phenergan 12.5 mg q6hrsPO  - Tramadol 50 q 4hr PRN - advance diet as tolerated - Consider starting reglan or other pro-motility agent.  #DM2 / Hyperglycemia: Blood glucose 416 on admission.She takes Lantus 11 units in the morning. Of note, patient was previously on 22units daily. Notes that she was decreased down to 11 units after last hospitalization and no one ever increased her dose (suspect because the patient did not check her CBGs). CBGs: 94-250, AM 124 - A1C 8.1 (from 11.1 in 12/206) - Lantus 5 units -SSI sensitive - CBG ACHS   #AKI on CKD III, resolived: Cr 2.84 >>1.87 on admission, baseline ~1.8  - continue coreg and hydralazine  - restart home Lisinopril  - mIVF as above - prn hydralazine   #HTN, elevated this morning:  - Carvedilol 25mg  bid - restart home lisinopril today  - hydralazine 10mg  TID   Chronic Back Pain/Buttock  Pain: Likely musculoskeletal. Some tenderness in left lumbar paraspinal muscles; tenderness over right greater trochanter.  - K pad - consider Tylenol and strengthening exercises   Normocytic Anemia: hgb 9.5 from 12.9 on admission. Likely dilutional as all cell lines decreased. Baseline ~ 10-11. Has been in 9s prior to this.   Depression/anxiety: Seems PCP has had an issue with patient following up with mental health. Patient has declined being on controller medications and gets frustrated that PCP will not prescribe Ativan. - continue to monitor  FEN/GI: soft diet , colace BID Prophylaxis: heparin subq   Disposition: pending management of DKA   Subjective:  Reports improved abdominal pain and nausea. States she would like to go home. Reports of the low back pain which she clarifies as right buttock pain that radiates up to right lower lumbar spine. Symptoms intermittently since October; noticed a "knot" on her right buttock that changes in size intermittently when it gets inflamed; notices limp when the knot gets inflamed. Sometimes pain radiates down to knee. Noticed elevated BP in [id number] for systolic. No chest pain, HA, blurred vision, or SOB. Notes of some mild nausea. Ate jello, banana, apple sauce.   Objective: Temp:  [98.8 F (37.1 C)-99.3 F (37.4 C)] 98.8 F (37.1 C) (03/28 0759) Pulse Rate:  [66-77] 69 (03/28 0501) Resp:  [13-19] 16 (03/28 0816) BP: (139-188)/(68-84) 182/69 mmHg (03/28 0816) SpO2:  [99 %-100 %] 99 % (03/28 0501) Weight:  [66.906 kg (147 lb 8 oz)] 66.906 kg (147 lb 8 oz) (03/28 0501) Physical Exam: General: NAD  Cardiovascular: RRR, no m/r/g Chest: portacath on right chest  Respiratory: normal effort, CTAB Abdomen: soft,  NT, ND Back: tenderness over right greater trochanter and right paraspinal muscles of lumbar spine. Negative straight leg raise. Extremities: no LE edema or tenderness  Laboratory:  Recent Labs Lab 03/21/16 1458 03/23/16 0535  WBC  15.0* 6.5  HGB 12.9 9.5*  HCT 38.3 28.8*  PLT 267 182    Recent Labs Lab 03/21/16 1458  03/21/16 2247 03/22/16 0346 03/23/16 0535  NA 141  < > 143 140 138  K 4.1  < > 4.4 3.8 3.7  CL 103  < > 114* 110 109  CO2 19*  < > 17* 20* 22  BUN 41*  < > 40* 38* 25*  CREATININE 2.84*  < > 2.38* 2.35* 1.87*  CALCIUM 10.6*  < > 9.1 9.0 8.7*  PROT 8.7*  --   --   --   --   BILITOT 1.0  --   --   --   --   ALKPHOS 92  --   --   --   --   ALT 18  --   --   --   --   AST 25  --   --   --   --   GLUCOSE 464*  < > 159* 168* 123*  < > = values in this interval not displayed. On admission, Venous: pH 7.26, pCO2 49.5, pO2 30.0 i trop 0.05 > 0.00    Imaging/Diagnostic Tests: Dg Chest 2 View 03/21/2016 IMPRESSION: No acute cardiopulmonary abnormality. Electronically Signed By: Genevie Ann M.D. On: 03/21/2016 15:27   Smiley Houseman, MD 03/23/2016, 9:29 AM PGY-1, [place] Intern pager: [phone], text pages welcome

## 2016-03-25 ENCOUNTER — Inpatient Hospital Stay: Payer: Self-pay | Admitting: Family Medicine

## 2016-03-25 DIAGNOSIS — N183 Chronic kidney disease, stage 3 (moderate): Secondary | ICD-10-CM | POA: Diagnosis not present

## 2016-03-25 DIAGNOSIS — I10 Essential (primary) hypertension: Secondary | ICD-10-CM | POA: Diagnosis not present

## 2016-04-01 ENCOUNTER — Other Ambulatory Visit: Payer: Self-pay | Admitting: Family Medicine

## 2016-04-01 DIAGNOSIS — E1121 Type 2 diabetes mellitus with diabetic nephropathy: Secondary | ICD-10-CM

## 2016-04-05 ENCOUNTER — Other Ambulatory Visit: Payer: Self-pay | Admitting: Family Medicine

## 2016-04-05 ENCOUNTER — Other Ambulatory Visit: Payer: Self-pay | Admitting: *Deleted

## 2016-04-05 DIAGNOSIS — E1149 Type 2 diabetes mellitus with other diabetic neurological complication: Secondary | ICD-10-CM

## 2016-04-05 MED ORDER — PREGABALIN 50 MG PO CAPS: ORAL_CAPSULE | ORAL | Status: DC

## 2016-04-05 MED ORDER — INSULIN GLARGINE 100 UNIT/ML ~~LOC~~ SOLN
5.0000 [IU] | Freq: Every day | SUBCUTANEOUS | Status: DC
Start: 2016-04-05 — End: 2016-04-12

## 2016-04-05 NOTE — Telephone Encounter (Signed)
Pt is calling on refill on her Lantus. jw

## 2016-04-05 NOTE — Telephone Encounter (Signed)
It was refilled today.  LMOVM for pt to return call. Rahmir Beever, [name]

## 2016-04-12 ENCOUNTER — Encounter: Payer: Self-pay | Admitting: Family Medicine

## 2016-04-12 ENCOUNTER — Ambulatory Visit (INDEPENDENT_AMBULATORY_CARE_PROVIDER_SITE_OTHER): Payer: Medicare Other | Admitting: Family Medicine

## 2016-04-12 VITALS — BP 124/76 | HR 52 | Temp 98.0°F | Ht 61.0 in | Wt 154.7 lb

## 2016-04-12 DIAGNOSIS — M546 Pain in thoracic spine: Secondary | ICD-10-CM

## 2016-04-12 DIAGNOSIS — E1121 Type 2 diabetes mellitus with diabetic nephropathy: Secondary | ICD-10-CM | POA: Diagnosis not present

## 2016-04-12 DIAGNOSIS — E1165 Type 2 diabetes mellitus with hyperglycemia: Secondary | ICD-10-CM

## 2016-04-12 MED ORDER — ACCU-CHEK SOFTCLIX LANCETS MISC: 1.0000 | Freq: Four times a day (QID) | Status: DC

## 2016-04-12 MED ORDER — INSULIN GLARGINE 100 UNIT/ML ~~LOC~~ SOLN: 5.0000 [IU] | Freq: Every day | SUBCUTANEOUS | Status: DC

## 2016-04-12 NOTE — Progress Notes (Signed)
  Patient name: [name] [name] MRN [id number]  Date of birth: [birth year]  CC & HPI:  [name] [name] is a 50 y.o. female presenting today for back pain and diabetes.   Diabetes - continues to take 5u Lantus daily since last hospital discharge - reports BGs running "high" but doesn't have log with her - denies hypoglycemia  Back Pain - Reports muscle spasm / cramping between shoulder blades - pain occurs 2-3 days a weeks and resolves without medications - denies fevers, chills, trauma, weight loss - denies numbness / weakness in extremities  Smoking History Noted  Objective Findings:  Vitals: BP 124/76 mmHg  Pulse 52  Temp(Src) 98 F (36.7 C)  Ht 5\' 1"  (1.549 m)  Wt 154 lb 11.2 oz (70.171 kg)  BMI 29.25 kg/m2  SpO2 99%  Gen: NAD CV: RRR w/o m/r/g, pulses +2 b/l Resp: CTAB w/ normal respiratory effort MSK: upper back with erythema or rash; no masses or spasm noted; no spinal process tenderness, diffuse muscle tenderness to light palpation  Assessment & Plan:   Type 2 diabetes mellitus with hyperglycemia, without long-term current use of insulin (HCC) - Advised to check BGs qam; titrate Lantus 1u daily until BGs less than 150 or Lantus 20 units reached.  - f/u in 1-2 with Dr [name] or Dr Berkley Harvey  Backache Upper back pain / muscle spasm without signs of infection or neurovascular compromise.  - Recommended heating pad and stretching - offered PT referral, patient declines - f/u in 1-2 weeks if pain persist

## 2016-04-12 NOTE — Assessment & Plan Note (Signed)
-   Advised to check BGs qam; titrate Lantus 1u daily until BGs less than 150 or Lantus 20 units reached.  - f/u in 1-2 with Dr [name] or Dr Berkley Harvey

## 2016-04-12 NOTE — Assessment & Plan Note (Signed)
Upper back pain / muscle spasm without signs of infection or neurovascular compromise.  - Recommended heating pad and stretching - offered PT referral, patient declines - f/u in 1-2 weeks if pain persist

## 2016-04-12 NOTE — Patient Instructions (Addendum)
It was great seeing you today.    Check your blood sugar first thing in the morning prior to eating or taking Lantus.  Increase your Lantus dose by 1 unit every day until your blood sugar is consistently less than 150.   Using a heating pad for your muscle spasms.  Gentle stretching as needed.  Let me know if you want a referral to physical therapy for your upper back pain   Please bring all your medications to every doctors visit  Sign up for My Chart to have easy access to your labs results, and communication with your Primary care physician.  Next Appointment  Make appointment with Dr [name] in the next 1-2 weeks for diabetes  Please call to make an appointment with Dr Berkley Harvey in 4-6 weeks for diabetes   I look forward to talking with you again at our next visit. If you have any questions or concerns before then, please call the clinic at [phone].  Take Care,   Dr Phill Myron Muscle Cramps and Spasms Muscle cramps and spasms occur when a muscle or muscles tighten and you have no control over this tightening (involuntary muscle contraction). They are a common problem and can develop in any muscle. The most common place is in the calf muscles of the leg. Both muscle cramps and muscle spasms are involuntary muscle contractions, but they also have differences:   Muscle cramps are sporadic and painful. They may last a few seconds to a quarter of an hour. Muscle cramps are often more forceful and last longer than muscle spasms.  Muscle spasms may or may not be painful. They may also last just a few seconds or much longer. CAUSES  It is uncommon for cramps or spasms to be due to a serious underlying problem. In many cases, the cause of cramps or spasms is unknown. Some common causes are:   Overexertion.   Overuse from repetitive motions (doing the same thing over and over).   Remaining in a certain position for a long period of time.   Improper preparation, form, or  technique while performing a sport or activity.   Dehydration.   Injury.   Side effects of some medicines.   Abnormally low levels of the salts and ions in your blood (electrolytes), especially potassium and calcium. This could happen if you are taking water pills (diuretics) or you are pregnant.  Some underlying medical problems can make it more likely to develop cramps or spasms. These include, but are not limited to:   Diabetes.   Parkinson disease.   Hormone disorders, such as thyroid problems.   Alcohol abuse.   Diseases specific to muscles, joints, and bones.   Blood vessel disease where not enough blood is getting to the muscles.  HOME CARE INSTRUCTIONS   Stay well hydrated. Drink enough water and fluids to keep your urine clear or pale yellow.  It may be helpful to massage, stretch, and relax the affected muscle.  For tight or tense muscles, use a warm towel, heating pad, or hot shower water directed to the affected area.  If you are sore or have pain after a cramp or spasm, applying ice to the affected area may relieve discomfort.  Put ice in a plastic bag.  Place a towel between your skin and the bag.  Leave the ice on for 15-20 minutes, 03-04 times a day.  Medicines used to treat a known cause of cramps or spasms may help reduce their frequency  or severity. Only take over-the-counter or prescription medicines as directed by your caregiver. SEEK MEDICAL CARE IF:  Your cramps or spasms get more severe, more frequent, or do not improve over time.  MAKE SURE YOU:   Understand these instructions.  Will watch your condition.  Will get help right away if you are not doing well or get worse.   This information is not intended to replace advice given to you by your health care provider. Make sure you discuss any questions you have with your health care provider.   Document Released: 06/04/2002 Document Revised: 04/09/2013 Document Reviewed:  11/29/2012 Elsevier Interactive Patient Education [employer].

## 2016-04-22 ENCOUNTER — Encounter: Payer: Self-pay | Admitting: Pharmacist

## 2016-04-22 ENCOUNTER — Ambulatory Visit (INDEPENDENT_AMBULATORY_CARE_PROVIDER_SITE_OTHER): Payer: Medicare Other | Admitting: Pharmacist

## 2016-04-22 VITALS — BP 137/59 | HR 52 | Wt 151.9 lb

## 2016-04-22 DIAGNOSIS — E1165 Type 2 diabetes mellitus with hyperglycemia: Secondary | ICD-10-CM

## 2016-04-22 DIAGNOSIS — I10 Essential (primary) hypertension: Secondary | ICD-10-CM

## 2016-04-22 NOTE — Assessment & Plan Note (Signed)
Hypertension longstanding.  Patient deniets adherence with medication. Control is suboptimal due to lack of adhrence. Her HR was 52 today in clinic so we decreased her carvedilol (COREG) from 25 mg BID -> 12.5 mg BID. Hydralazine only being taken BID.

## 2016-04-22 NOTE — Progress Notes (Signed)
Patient ID: [name] [name], female   DOB: [birth year], 50 y.o.   MRN: [id number] Reviewed: Agree with Dr. Graylin Shiver docuementation and management.

## 2016-04-22 NOTE — Assessment & Plan Note (Signed)
Diabetes longstanding. Patient denies hypoglycemic events and is able to verbalize appropriate hypoglycemia management plan. Patient reports improved adherence with medication since January. Control is suboptimal due to lack of blood glucose monitoring.  Next A1C anticipated in June.  Continue insuline glargine (LANTUS) 5 units daily;  6 units if BG is > 150.  Reevaluate CBGs at next visit.

## 2016-04-22 NOTE — Progress Notes (Signed)
S:    Patient arrives in good spirits and is ambulating without assistace.  Presents for diabetes evaluation, education, and management at the request of Dr. Berkley Harvey Patient was referred on 04/12/2016.  Patient was last seen by Primary Care Provider on 04/12/2016. Patient reports Diabetes was diagnosed for over 30 years and taking insulin for 10 years. Patient reported dietary habits: Eats sporadically throughout the day. Pt reports she has been drinking more water and drinking less juice. She does still have her ocasional Apple Snapple. Pt gets one check a month and has a hard time affording lancets. Patient denies hypoglycemic events. The patient has been taking Hydralazine (APRESOLINE) 10 mg BID because she forgets to take it in the afternoon. Admits to smoking 1/2 ppd and recreational marijuana.    Patient reports adherence with medications.  Current diabetes medications include: Insulin glargine  (LANTUS) 5 units daily; 6 units if BG is > 150  Current hypertension medications include:  Losartan (COZAAR) 100 mg once daily Carvedilol (COREG) 25 mg BID Hydralazine (APRESOLINE) 10 mg TID  Patient denies nocturia.  Patient denies neuropathy. Patient denies visual changes. Patient reports self foot exams.   O:  Lab Results  Component Value Date   HGBA1C 8.1* 03/21/2016   Filed Vitals:   04/22/16 0958  BP: 137/59  Pulse: 52    A/P: Diabetes longstanding. Patient denies hypoglycemic events and is able to verbalize appropriate hypoglycemia management plan. Patient reports improved adherence with medication since January. Control is suboptimal due to lack of blood glucose monitoring.  Next A1C anticipated in June.  Continue insuline glargine (LANTUS) 5 units daily;  6 units if BG is > 150.  Reevaluate CBGs at next visit.   Hypertension longstanding.  Patient deniets adherence with medication. Control is suboptimal due to lack of adhrence. Her HR was 52 today in clinic so we decreased her  carvedilol (COREG) from 25 mg BID -> 12.5 mg BID. Hydralazine only being taken BID.    Written patient instructions provided.  Total time in face to face counseling 35 minutes.   Follow up in Pharmacist Clinic Visit in 2-3 weeks.   Patient seen with Rudolpho Sevin, PharmD [place].

## 2016-04-22 NOTE — Patient Instructions (Signed)
Thank you for coming to see [place] today!  Please take 12.5 mg of  carvedilol (COREG)   Please check your blood glucose at least once a day.  Please come back to see [place] in about 2-3 weeks.   Have a great day!

## 2016-04-30 ENCOUNTER — Encounter (HOSPITAL_COMMUNITY): Payer: Self-pay | Admitting: *Deleted

## 2016-04-30 ENCOUNTER — Observation Stay (HOSPITAL_COMMUNITY)
Admission: EM | Admit: 2016-04-30 | Discharge: 2016-05-01 | Discharge status: Against Medical Advice | Payer: Medicare Other | Attending: Internal Medicine | Admitting: Internal Medicine

## 2016-04-30 ENCOUNTER — Emergency Department (HOSPITAL_COMMUNITY): Payer: Medicare Other

## 2016-04-30 DIAGNOSIS — R0789 Other chest pain: Secondary | ICD-10-CM | POA: Diagnosis not present

## 2016-04-30 DIAGNOSIS — R112 Nausea with vomiting, unspecified: Secondary | ICD-10-CM

## 2016-04-30 DIAGNOSIS — I129 Hypertensive chronic kidney disease with stage 1 through stage 4 chronic kidney disease, or unspecified chronic kidney disease: Secondary | ICD-10-CM | POA: Insufficient documentation

## 2016-04-30 DIAGNOSIS — Z720 Tobacco use: Secondary | ICD-10-CM | POA: Diagnosis present

## 2016-04-30 DIAGNOSIS — E1122 Type 2 diabetes mellitus with diabetic chronic kidney disease: Secondary | ICD-10-CM | POA: Diagnosis not present

## 2016-04-30 DIAGNOSIS — E1143 Type 2 diabetes mellitus with diabetic autonomic (poly)neuropathy: Secondary | ICD-10-CM | POA: Diagnosis not present

## 2016-04-30 DIAGNOSIS — F17210 Nicotine dependence, cigarettes, uncomplicated: Secondary | ICD-10-CM | POA: Diagnosis not present

## 2016-04-30 DIAGNOSIS — K219 Gastro-esophageal reflux disease without esophagitis: Secondary | ICD-10-CM | POA: Diagnosis not present

## 2016-04-30 DIAGNOSIS — R7989 Other specified abnormal findings of blood chemistry: Secondary | ICD-10-CM | POA: Diagnosis not present

## 2016-04-30 DIAGNOSIS — I10 Essential (primary) hypertension: Secondary | ICD-10-CM | POA: Diagnosis not present

## 2016-04-30 DIAGNOSIS — I252 Old myocardial infarction: Secondary | ICD-10-CM | POA: Insufficient documentation

## 2016-04-30 DIAGNOSIS — R778 Other specified abnormalities of plasma proteins: Secondary | ICD-10-CM

## 2016-04-30 DIAGNOSIS — N183 Chronic kidney disease, stage 3 (moderate): Secondary | ICD-10-CM | POA: Diagnosis not present

## 2016-04-30 DIAGNOSIS — R079 Chest pain, unspecified: Secondary | ICD-10-CM | POA: Diagnosis present

## 2016-04-30 DIAGNOSIS — K3184 Gastroparesis: Secondary | ICD-10-CM | POA: Diagnosis not present

## 2016-04-30 DIAGNOSIS — Z794 Long term (current) use of insulin: Secondary | ICD-10-CM | POA: Diagnosis not present

## 2016-04-30 DIAGNOSIS — R1013 Epigastric pain: Secondary | ICD-10-CM

## 2016-04-30 DIAGNOSIS — R05 Cough: Secondary | ICD-10-CM | POA: Diagnosis not present

## 2016-04-30 DIAGNOSIS — E1159 Type 2 diabetes mellitus with other circulatory complications: Secondary | ICD-10-CM | POA: Diagnosis present

## 2016-04-30 DIAGNOSIS — N184 Chronic kidney disease, stage 4 (severe): Secondary | ICD-10-CM | POA: Diagnosis present

## 2016-04-30 DIAGNOSIS — I152 Hypertension secondary to endocrine disorders: Secondary | ICD-10-CM | POA: Diagnosis present

## 2016-04-30 DIAGNOSIS — F17200 Nicotine dependence, unspecified, uncomplicated: Secondary | ICD-10-CM

## 2016-04-30 LAB — CBC
HCT: 37.5 % (ref 36.0–46.0)
Hemoglobin: 12.6 g/dL (ref 12.0–15.0)
MCH: 30.4 pg (ref 26.0–34.0)
MCHC: 33.6 g/dL (ref 30.0–36.0)
MCV: 90.6 fL (ref 78.0–100.0)
PLATELETS: 219 10*3/uL (ref 150–400)
RBC: 4.14 MIL/uL (ref 3.87–5.11)
RDW: 14.2 % (ref 11.5–15.5)
WBC: 7.5 10*3/uL (ref 4.0–10.5)

## 2016-04-30 LAB — COMPREHENSIVE METABOLIC PANEL
ALBUMIN: 4.6 g/dL (ref 3.5–5.0)
ALK PHOS: 77 U/L (ref 38–126)
ALT: 16 U/L (ref 14–54)
AST: 23 U/L (ref 15–41)
Anion gap: 12 (ref 5–15)
BUN: 26 mg/dL — AB (ref 6–20)
CALCIUM: 10.4 mg/dL — AB (ref 8.9–10.3)
CHLORIDE: 110 mmol/L (ref 101–111)
CO2: 21 mmol/L — AB (ref 22–32)
CREATININE: 1.99 mg/dL — AB (ref 0.44–1.00)
GFR calc Af Amer: 32 mL/min — ABNORMAL LOW (ref >60)
GFR calc non Af Amer: 28 mL/min — ABNORMAL LOW (ref >60)
GLUCOSE: 87 mg/dL (ref 65–99)
Potassium: 4.2 mmol/L (ref 3.5–5.1)
SODIUM: 143 mmol/L (ref 135–145)
Total Bilirubin: 0.8 mg/dL (ref 0.3–1.2)
Total Protein: 8 g/dL (ref 6.5–8.1)

## 2016-04-30 LAB — URINALYSIS, ROUTINE W REFLEX MICROSCOPIC
GLUCOSE, UA: NEGATIVE mg/dL
KETONES UR: 15 mg/dL — AB
Leukocytes, UA: NEGATIVE
Nitrite: NEGATIVE
Specific Gravity, Urine: 1.018 (ref 1.005–1.030)
pH: 5.5 (ref 5.0–8.0)

## 2016-04-30 LAB — TROPONIN I
TROPONIN I: 0.04 ng/mL — AB (ref <0.031)
Troponin I: 0.04 ng/mL — ABNORMAL HIGH (ref <0.031)

## 2016-04-30 LAB — URINE MICROSCOPIC-ADD ON

## 2016-04-30 LAB — CBG MONITORING, ED: GLUCOSE-CAPILLARY: 85 mg/dL (ref 65–99)

## 2016-04-30 LAB — LIPASE, BLOOD: LIPASE: 27 U/L (ref 11–51)

## 2016-04-30 MED ORDER — SODIUM CHLORIDE 0.9 % IV BOLUS (SEPSIS)
1000.0000 mL | Freq: Once | INTRAVENOUS | Status: AC
  Administered 2016-04-30: 1000 mL via INTRAVENOUS

## 2016-04-30 MED ORDER — LORAZEPAM 2 MG/ML IJ SOLN
1.0000 mg | Freq: Once | INTRAMUSCULAR | Status: AC
  Administered 2016-04-30: 1 mg via INTRAVENOUS
  Filled 2016-04-30: qty 1

## 2016-04-30 MED ORDER — HYDROMORPHONE HCL 1 MG/ML IJ SOLN
1.0000 mg | Freq: Once | INTRAMUSCULAR | Status: AC
  Administered 2016-04-30: 1 mg via INTRAVENOUS
  Filled 2016-04-30: qty 1

## 2016-04-30 MED ORDER — ONDANSETRON HCL 4 MG/2ML IJ SOLN
4.0000 mg | Freq: Once | INTRAMUSCULAR | Status: AC
  Administered 2016-04-30: 4 mg via INTRAVENOUS
  Filled 2016-04-30: qty 2

## 2016-04-30 MED ORDER — HYDRALAZINE HCL 20 MG/ML IJ SOLN
5.0000 mg | Freq: Once | INTRAMUSCULAR | Status: AC
  Administered 2016-04-30: 5 mg via INTRAVENOUS
  Filled 2016-04-30: qty 1

## 2016-04-30 NOTE — ED Notes (Signed)
She has a porta cath from vomiting for 14 years

## 2016-04-30 NOTE — ED Notes (Signed)
Pt actively vomiting in triage 

## 2016-04-30 NOTE — ED Notes (Signed)
The pt drove herself here

## 2016-04-30 NOTE — ED Notes (Signed)
The pt has had chest pain with n v and a headache since this am  Mid-chest pain

## 2016-04-30 NOTE — ED Notes (Signed)
Offered the pt zofran but she  Reports that it does not work for her

## 2016-04-30 NOTE — ED Provider Notes (Signed)
CSN: [id number]     Arrival date & time 04/30/16  1823 History   First MD Initiated Contact with Patient 04/30/16 1956     Chief Complaint  Patient presents with  . Chest Pain  . Headache  . Emesis     (Consider location/radiation/quality/duration/timing/severity/associated sxs/prior Treatment) HPI  Pt presenting with c/o nausea and vomiting with epigastric pain and cough.  She states symptoms started this morning.  She states her cough is making her vomiting worse.  She reports symptoms are similar to previous flares of her vomiting and abdominal pain. No fever/chillls but does have sweats and cold intermittently.  No dysuria.  No difficulty breathing.  Describes a chest pain that is in center of chest and radiating from her upper stomach.  No leg swelling.  No fainting.  There are no other associated systemic symptoms, there are no other alleviating or modifying factors.   Past Medical History  Diagnosis Date  . PANCREATITIS 05/09/2008  . Cyclic vomiting syndrome   . Hypertension   . Complication of anesthesia     "problems waking up"  . Heart murmur   . Myocardial infarction [facility]) 07/2007    "they say I've had a silent one; I don't know"  . Pneumonia   . Anemia   . Blood transfusion   . GERD (gastroesophageal reflux disease)   . Anxiety   . Depression   . Smoker   . Marijuana smoker, continuous ([place])   . NEUROPATHY, DIABETIC 02/23/2007  . Type II diabetes mellitus ([place])   . Diabetic gastroparesis ([place])     /e-chart  . Chronic kidney disease (CKD), stage III (moderate)   . Nausea & vomiting 06/10/2015  . Intestinal impaction [facility])    Past Surgical History  Procedure Laterality Date  . Port-a-cath placement  ~ 2008    right chest; "poor access; frequent hospitalizations"  . Laparotomy and lysis of adhesions    . Abdominal hysterectomy  02/2002  . Cholecystectomy  1980's  . Cardiac catheterization    . Dilation and evacuation  X3  . Left hand surgery    . Cataract  extraction w/ intraocular lens  implant, bilateral    . Colonoscopy    . Upper gi endoscopy    . Cesarean section     Family History  Problem Relation Age of Onset  . Diabetes type II Mother   . Diabetes Mother   . Hypertension Mother   . Diabetes type II Sister   . Diabetes Sister   . Hypertension Sister   . Hypertension Brother    Social History  Substance Use Topics  . Smoking status: Current Some Day Smoker -- 0.50 packs/day for 22 years    Types: Cigarettes    Start date: 12/27/1982  . Smokeless tobacco: Never Used     Comment: THC 1-2 times per day almost daily  . Alcohol Use: No   OB History    Gravida Para Term Preterm AB TAB SAB Ectopic Multiple Living   _0 Review of Systems  ROS reviewed and all otherwise negative except for mentioned in HPI    Allergies  Bee venom; Acetaminophen; Novolog; Other; Tegaderm ag mesh; and Erythromycin  Home Medications   Prior to Admission medications   Medication Sig Start Date End Date Taking? Authorizing Provider  carvedilol (COREG) 25 MG tablet Take 1 tablet (25 mg total) by mouth 2 (two) times daily with a  meal. 01/14/16  Yes Olam Idler, MD  hydrALAZINE (APRESOLINE) 10 MG tablet Take 1 tablet (10 mg total) by mouth 3 (three) times daily. Patient taking differently: Take 10 mg by mouth 2 (two) times daily.  09/29/15  Yes Alyssa A Haney, MD  insulin glargine (LANTUS) 100 UNIT/ML injection Inject 0.05-0.2 mLs (5-20 Units total) into the skin daily. Patient taking differently: Inject 6 Units into the skin daily.  04/12/16  Yes Olam Idler, MD  losartan (COZAAR) 100 MG tablet Take 1 tablet (100 mg total) by mouth daily. 03/23/16  Yes Smiley Houseman, MD  pregabalin (LYRICA) 50 MG capsule TAKE 1 TABLET BY MOUTH AT BEDTIME AS NEEDED 04/05/16  Yes Olam Idler, MD  promethazine (PHENERGAN) 12.5 MG suppository Place 1 suppository (12.5 mg total) rectally every 6 (six) hours as needed for nausea. 01/21/16  Yes  Sela Hua, MD  promethazine (PHENERGAN) 25 MG tablet Take 1 tablet (25 mg total) by mouth every 6 (six) hours as needed for nausea or vomiting. 12/04/15  Yes Leone Brand, MD  ACCU-CHEK SOFTCLIX LANCETS lancets 1 each by Other route 4 (four) times daily. for testing 04/12/16   Olam Idler, MD  blood glucose meter kit and supplies KIT Dispense based on patient and insurance preference. Use up to four times daily as directed. (FOR ICD-9 250.00, 250.01). 06/02/15   Hilton Sinclair, MD  diclofenac sodium (VOLTAREN) 1 % GEL Apply 2-4 g topically 4 (four) times daily. Apply over affected area up to seven days Patient not taking: Reported on 04/22/2016 03/23/16   Smiley Houseman, MD  polyethylene glycol (MIRALAX / Floria Raveling) packet Take 17 g by mouth daily as needed. May increase in 2-3 days to 2 times daily, Until stooling regularly Patient not taking: Reported on 04/22/2016 06/02/15   Hilton Sinclair, MD   BP 130/50 mmHg  Pulse 82  Temp(Src) 98.4 F (36.9 C) (Oral)  Resp 16  Ht 5' 1" (1.549 m)  Wt 66.225 kg  BMI 27.60 kg/m2  SpO2 100%  Vitals reviewed Physical Exam  Physical Examination: General appearance - alert, well appearing, and in no distress Mental status - alert, oriented to person, place, and time Eyes - no conjunctival injection, no scleral icterus Mouth - mucous membranes moist, pharynx normal without lesions Chest - clear to auscultation, no wheezes, rales or rhonchi, symmetric air entry Heart - normal rate, regular rhythm, normal S1, S2, no murmurs Abdomen - soft, diffuse ttp, primarily epigastric, no gaurding or rebound tenderness, nondistended, no masses or organomegaly Neurological - alert, oriented, normal speech Extremities - peripheral pulses normal, no pedal edema, no clubbing or cyanosis Skin - normal coloration and turgor, no rashes  ED Course  Procedures (including critical care time) Labs Review Labs Reviewed  COMPREHENSIVE METABOLIC PANEL -  Abnormal; Notable for the following:    CO2 21 (*)    BUN 26 (*)    Creatinine, Ser 1.99 (*)    Calcium 10.4 (*)    GFR calc non Af Amer 28 (*)    GFR calc Af Amer 32 (*)    All other components within normal limits  URINALYSIS, ROUTINE W REFLEX MICROSCOPIC (NOT AT [facility]) - Abnormal; Notable for the following:    APPearance CLOUDY (*)    Hgb urine dipstick MODERATE (*)    Bilirubin Urine SMALL (*)    Ketones, ur 15 (*)    Protein, ur >300 (*)    All other components within normal  limits  TROPONIN I - Abnormal; Notable for the following:    Troponin I 0.04 (*)    All other components within normal limits  URINE MICROSCOPIC-ADD ON - Abnormal; Notable for the following:    Squamous Epithelial / LPF 6-30 (*)    Bacteria, UA MANY (*)    All other components within normal limits  TROPONIN I - Abnormal; Notable for the following:    Troponin I 0.04 (*)    All other components within normal limits  TROPONIN I - Abnormal; Notable for the following:    Troponin I 0.07 (*)    All other components within normal limits  TROPONIN I - Abnormal; Notable for the following:    Troponin I 0.06 (*)    All other components within normal limits  COMPREHENSIVE METABOLIC PANEL - Abnormal; Notable for the following:    CO2 21 (*)    BUN 24 (*)    Creatinine, Ser 1.87 (*)    GFR calc non Af Amer 30 (*)    GFR calc Af Amer 35 (*)    All other components within normal limits  CBC WITH DIFFERENTIAL/PLATELET - Abnormal; Notable for the following:    RBC 3.58 (*)    Hemoglobin 11.1 (*)    HCT 33.0 (*)    All other components within normal limits  GLUCOSE, CAPILLARY - Abnormal; Notable for the following:    Glucose-Capillary 141 (*)    All other components within normal limits  GLUCOSE, CAPILLARY - Abnormal; Notable for the following:    Glucose-Capillary 50 (*)    All other components within normal limits  GLUCOSE, CAPILLARY - Abnormal; Notable for the following:    Glucose-Capillary 116 (*)     All other components within normal limits  GLUCOSE, CAPILLARY - Abnormal; Notable for the following:    Glucose-Capillary 169 (*)    All other components within normal limits  LIPASE, BLOOD  CBC  CBG MONITORING, ED    Imaging Review Dg Chest 2 View  04/30/2016  CLINICAL DATA:  Cough, vomiting, and chest pain for 1 day. EXAM: CHEST  2 VIEW COMPARISON:  03/21/2016 FINDINGS: Right central venous catheter with tip over the upper SVC region. Normal heart size and pulmonary vascularity. No focal airspace disease or consolidation in the lungs. No blunting of costophrenic angles. No pneumothorax. Mediastinal contours appear intact. Surgical clips in the right upper quadrant. IMPRESSION: No active cardiopulmonary disease. Electronically Signed   By: Lucienne Capers M.D.   On: 04/30/2016 21:20   I have personally reviewed and evaluated these images and lab results as part of my medical decision-making.   EKG Interpretation   Date/Time:  Friday Apr 30 2016 18:28:07 EDT Ventricular Rate:  121 PR Interval:  142 QRS Duration: 70 QT Interval:  316 QTC Calculation: 448 R Axis:   69 Text Interpretation:  Sinus tachycardia Right atrial enlargement  Borderline ECG Since previous tracing rate faster Confirmed by [facility]  MD,  MARTHA [phone]) on 04/30/2016 8:19:54 PM      MDM   Final diagnoses:  Epigastric pain  Chest pain, unspecified chest pain type  Nausea and vomiting, vomiting of unspecified type  Essential hypertension  Elevated troponin    Pt presenting with vomiting, abdominal pain as well as chest pain and hypertension.  Pt treated for her symptoms with dilaudid, zofran, ativan.  IV fluids.  Her BP was treated with IV hydralazine as she is vomiting.  CXR reassuring, EKG without acute changes- she is tachycardic.  Troponin mildly elevated at 0.04- repeated and similarly elevated.  D/w triad for admission.    11:29 PM d/w Dr. Olevia Bowens, triad hospitalist, pt will go to telemetry bed, he states  he is entering the orders now.   Alfonzo Beers, MD 05/01/16 [phone]

## 2016-04-30 NOTE — ED Notes (Signed)
The pt started out c/o vomiting then chest pain then a headache

## 2016-04-30 NOTE — H&P (Signed)
.  History and Physical    [name] [name] ASN:[id number] DOB: 25-Nov-1966 DOA: 04/30/2016  Referring MD/NP/PA: Alfonzo Beers, MD PCP: Phill Myron, MD  Outpatient Specialists:  Patient coming from: Home.  Chief Complaint: Chest pain, nausea and vomiting.  HPI: [name] [name] is a 50 y.o. female with medical history significant of pancreatitis, cyclic vomiting syndrome, hypertension, GERD, anxiety, depression, hyperlipidemia, type 2 diabetes, diabetic gastroparesis comes to the emergency department with complaints of burning-like chest pain "coming from her stomach" after she developed epigastric abdominal pain associated with nausea and about 15 episodes of emesis since this morning. She stated her chest pain is not radiated, associated with mild dyspnea, but palpitations, but denies dizziness, diaphoresis, PND, pitting edema of the lower extremities or orthopnea. She denies fever, but complains of chills, fatigue. She denies diarrhea, melena or hematochezia.  ED Course: In the ER, the patient received IV fluids, hydromorphone, lorazepam, ondansetron, IV hydralazine and reports relief. Workup shows an increased rate, but nonischemic EKG, mildly elevated troponin level, abnormal UA with proteinuria and microscopic hematuria, improved renal function and a normal CBC.  Review of Systems: As per HPI otherwise 10 point review of systems negative.    Past Medical History  Diagnosis Date  . PANCREATITIS 05/09/2008  . Cyclic vomiting syndrome   . Hypertension   . Complication of anesthesia     "problems waking up"  . Heart murmur   . Myocardial infarction [facility]) 07/2007    "they say I've had a silent one; I don't know"  . Pneumonia   . Anemia   . Blood transfusion   . GERD (gastroesophageal reflux disease)   . Anxiety   . Depression   . Smoker   . Marijuana smoker, continuous ([place])   . NEUROPATHY, DIABETIC 02/23/2007  . Type II diabetes mellitus ([place])   . Diabetic gastroparesis ([place])    /e-chart  . Chronic kidney disease (CKD), stage III (moderate)   . Nausea & vomiting 06/10/2015  . Intestinal impaction [facility])     Past Surgical History  Procedure Laterality Date  . Port-a-cath placement  ~ 2008    right chest; "poor access; frequent hospitalizations"  . Laparotomy and lysis of adhesions    . Abdominal hysterectomy  02/2002  . Cholecystectomy  1980's  . Cardiac catheterization    . Dilation and evacuation  X3  . Left hand surgery    . Cataract extraction w/ intraocular lens  implant, bilateral    . Colonoscopy    . Upper gi endoscopy    . Cesarean section       reports that she has been smoking Cigarettes.  She started smoking about 33 years ago. She has a 11 pack-year smoking history. She has never used smokeless tobacco. She reports that she uses illicit drugs (Marijuana). She reports that she does not drink alcohol.  Allergies  Allergen Reactions  . Bee Venom Anaphylaxis    Required hospital visit to ER  . Acetaminophen Nausea And Vomiting  . Novolog [Insulin Aspart] Other (See Comments)    Muscles in feet cramp  . Other     Sorbaview causes rash  . Tegaderm Ag Mesh [Silver]     Blisters  . Erythromycin Nausea And Vomiting    Family History  Problem Relation Age of Onset  . Diabetes type II Mother   . Diabetes Mother   . Hypertension Mother   . Diabetes type II Sister   . Diabetes Sister   . Hypertension Sister   .  Hypertension Brother   Family history reviewed and updated with the patient.  Prior to Admission medications   Medication Sig Start Date End Date Taking? Authorizing Provider  carvedilol (COREG) 25 MG tablet Take 1 tablet (25 mg total) by mouth 2 (two) times daily with a meal. 01/14/16  Yes Olam Idler, MD  hydrALAZINE (APRESOLINE) 10 MG tablet Take 1 tablet (10 mg total) by mouth 3 (three) times daily. Patient taking differently: Take 10 mg by mouth 2 (two) times daily.  09/29/15  Yes Alyssa A Haney, MD  insulin glargine (LANTUS)  100 UNIT/ML injection Inject 0.05-0.2 mLs (5-20 Units total) into the skin daily. Patient taking differently: Inject 6 Units into the skin daily.  04/12/16  Yes Olam Idler, MD  losartan (COZAAR) 100 MG tablet Take 1 tablet (100 mg total) by mouth daily. 03/23/16  Yes Smiley Houseman, MD  pregabalin (LYRICA) 50 MG capsule TAKE 1 TABLET BY MOUTH AT BEDTIME AS NEEDED 04/05/16  Yes Olam Idler, MD  promethazine (PHENERGAN) 12.5 MG suppository Place 1 suppository (12.5 mg total) rectally every 6 (six) hours as needed for nausea. 01/21/16  Yes Sela Hua, MD  promethazine (PHENERGAN) 25 MG tablet Take 1 tablet (25 mg total) by mouth every 6 (six) hours as needed for nausea or vomiting. 12/04/15  Yes Leone Brand, MD  ACCU-CHEK SOFTCLIX LANCETS lancets 1 each by Other route 4 (four) times daily. for testing 04/12/16   Olam Idler, MD  blood glucose meter kit and supplies KIT Dispense based on patient and insurance preference. Use up to four times daily as directed. (FOR ICD-9 250.00, 250.01). 06/02/15   Hilton Sinclair, MD  diclofenac sodium (VOLTAREN) 1 % GEL Apply 2-4 g topically 4 (four) times daily. Apply over affected area up to seven days Patient not taking: Reported on 04/22/2016 03/23/16   Smiley Houseman, MD  polyethylene glycol (MIRALAX / Floria Raveling) packet Take 17 g by mouth daily as needed. May increase in 2-3 days to 2 times daily, Until stooling regularly Patient not taking: Reported on 04/22/2016 06/02/15   Hilton Sinclair, MD    Physical Exam: Filed Vitals:   04/30/16 2345 05/01/16 0015 05/01/16 0030 05/01/16 0105  BP: 140/68 148/64 169/83 147/79  Pulse: 89 85 96 102  Temp:    97.5 F (36.4 C)  TempSrc:    Oral  Resp:      Height:      Weight:      SpO2: 96% 100% 99% 100%      Constitutional: NAD, calm, comfortable Filed Vitals:   04/30/16 2345 05/01/16 0015 05/01/16 0030 05/01/16 0105  BP: 140/68 148/64 169/83 147/79  Pulse: 89 85 96 102  Temp:    97.5  F (36.4 C)  TempSrc:    Oral  Resp:      Height:      Weight:      SpO2: 96% 100% 99% 100%   Eyes: PERRL, lids and conjunctivae normal ENMT: Mucous membranes are dry. Posterior pharynx clear of any exudate or lesions. Neck: normal, supple, no masses, no thyromegaly Respiratory: clear to auscultation bilaterally, no wheezing, no crackles. Normal respiratory effort. No accessory muscle use.  Cardiovascular: Regular rate and rhythm, no murmurs / rubs / gallops. No extremity edema. 2+ pedal pulses. No carotid bruits.  Abdomen: No distention, Positive epigastric tenderness, no guarding, no rebound, no masses palpated. No hepatosplenomegaly. Bowel sounds positive.  Musculoskeletal: no clubbing / cyanosis. No joint deformity  upper and lower extremities. Good ROM, no contractures. Normal muscle tone.  Skin: no rashes, lesions, ulcers. No induration Neurologic: CN 2-12 grossly intact. Sensation intact, DTR normal. Strength 5/5 in all 4.  Psychiatric: Normal judgment and insight. Alert and oriented x 3. Normal mood.    Labs on Admission: I have personally reviewed following labs and imaging studies  CBC:  Recent Labs Lab 04/30/16 1832  WBC 7.5  HGB 12.6  HCT 37.5  MCV 90.6  PLT 937   Basic Metabolic Panel:  Recent Labs Lab 04/30/16 1832  NA 143  K 4.2  CL 110  CO2 21*  GLUCOSE 87  BUN 26*  CREATININE 1.99*  CALCIUM 10.4*   GFR: Estimated Creatinine Clearance: 29.1 mL/min (by C-G formula based on Cr of 1.99). Liver Function Tests:  Recent Labs Lab 04/30/16 1832  AST 23  ALT 16  ALKPHOS 77  BILITOT 0.8  PROT 8.0  ALBUMIN 4.6    Recent Labs Lab 04/30/16 1832  LIPASE 27   Cardiac Enzymes:  Recent Labs Lab 04/30/16 1832 04/30/16 2145  TROPONINI 0.04* 0.04*   CBG:  Recent Labs Lab 04/30/16 1849 05/01/16 0146  GLUCAP 85 141*   Urine analysis:    Component Value Date/Time   COLORURINE YELLOW 04/30/2016 1922   APPEARANCEUR CLOUDY* 04/30/2016 1922     LABSPEC 1.018 04/30/2016 1922   PHURINE 5.5 04/30/2016 1922   GLUCOSEU NEGATIVE 04/30/2016 1922   HGBUR MODERATE* 04/30/2016 1922   HGBUR small 10/19/2010 0852   BILIRUBINUR SMALL* 04/30/2016 1922   BILIRUBINUR NEG 12/04/2015 1017   KETONESUR 15* 04/30/2016 1922   PROTEINUR >300* 04/30/2016 1922   PROTEINUR 30 12/04/2015 1017   UROBILINOGEN 0.2 12/04/2015 1017   UROBILINOGEN 0.2 09/26/2015 1031   NITRITE NEGATIVE 04/30/2016 1922   NITRITE NEG 12/04/2015 1017   LEUKOCYTESUR NEGATIVE 04/30/2016 1922    Radiological Exams on Admission: Dg Chest 2 View  04/30/2016  CLINICAL DATA:  Cough, vomiting, and chest pain for 1 day. EXAM: CHEST  2 VIEW COMPARISON:  03/21/2016 FINDINGS: Right central venous catheter with tip over the upper SVC region. Normal heart size and pulmonary vascularity. No focal airspace disease or consolidation in the lungs. No blunting of costophrenic angles. No pneumothorax. Mediastinal contours appear intact. Surgical clips in the right upper quadrant. IMPRESSION: No active cardiopulmonary disease. Electronically Signed   By: Lucienne Capers M.D.   On: 04/30/2016 21:20  Echocardiogram 07/31/2013 ------------------------------------------------------------ LV EF: 55% -  60%  ------------------------------------------------------------ Indications:   Chest pain 786.51.  ------------------------------------------------------------ History:  Risk factors: Chronic kidney disease. Current tobacco use. Hypertension. Diabetes mellitus.  ------------------------------------------------------------ Study Conclusions  - Left ventricle: The cavity size was normal. Wall thickness was normal. Systolic function was normal. The estimated ejection fraction was in the range of 55% to 60%. Wall motion was normal; there were no regional wall motion abnormalities. Left ventricular diastolic function parameters were normal. - Aortic valve: Mild regurgitation  directed centrally in the LVOT. - Atrial septum: No defect or patent foramen ovale was identified.  EKG: Independently reviewed. Vent. rate 121 BPM PR interval 142 ms QRS duration 70 ms QT/QTc 316/448 ms P-R-T axes 84 69 86 Sinus tachycardia Right atrial enlargement Borderline ECG  Assessment/Plan Principal Problem:   Chest pain Pain seems to be atypical, but the patient has multiple risk factors. EKG is nonischemic. Admit to telemetry/observation Continue supplemental oxygen. Trend troponin levels. Check echocardiogram in the morning.  Active Problems:   Essential hypertension On losartan 100 mg  by mouth daily. On carvedilol 25 mg by mouth twice a day Per patient, her blood pressure is high, because she threw up the contents of her antihypertensive medications. Resume antihypertensive therapy since the patient is tolerating oral intake now. IV antihypertensives, if the patient Develops nausea and emesis again.    Diabetic gastroparesis associated with type 2 diabetes mellitus ([place]) Diabetic modified diet once the patient is tolerating oral intake and the nausea is controlled. Antiemetics as needed. Continue Lantus 5 units SQ daily. The patient does not use regular insulin before meals.    TOBACCO DEPENDENCE Declined nicotine replacement therapy.    GERD Protonix 40 mg IVP daily. Switch to oral Protonix once the patient's symptoms are controlled.    CKD (chronic kidney disease) Stable. Monitor renal function.       DVT prophylaxis: SCDs. Code Status: Full code. Family Communication:  Disposition Plan: Admit for chest pain rule out. Consults called:  Admission status: Telemetry/observation   Reubin Milan MD [facility] Pager [phone].  If 7PM-7AM, please contact night-coverage [email or web address] Password [email or web address]  05/01/2016, 2:05 AM

## 2016-05-01 ENCOUNTER — Other Ambulatory Visit (HOSPITAL_COMMUNITY): Payer: Self-pay

## 2016-05-01 ENCOUNTER — Encounter (HOSPITAL_COMMUNITY): Payer: Self-pay | Admitting: Internal Medicine

## 2016-05-01 DIAGNOSIS — I10 Essential (primary) hypertension: Secondary | ICD-10-CM | POA: Diagnosis present

## 2016-05-01 DIAGNOSIS — R0789 Other chest pain: Secondary | ICD-10-CM | POA: Diagnosis not present

## 2016-05-01 DIAGNOSIS — E1143 Type 2 diabetes mellitus with diabetic autonomic (poly)neuropathy: Secondary | ICD-10-CM | POA: Diagnosis present

## 2016-05-01 DIAGNOSIS — I152 Hypertension secondary to endocrine disorders: Secondary | ICD-10-CM | POA: Diagnosis present

## 2016-05-01 DIAGNOSIS — R079 Chest pain, unspecified: Secondary | ICD-10-CM

## 2016-05-01 DIAGNOSIS — N183 Chronic kidney disease, stage 3 (moderate): Secondary | ICD-10-CM

## 2016-05-01 DIAGNOSIS — K3184 Gastroparesis: Secondary | ICD-10-CM

## 2016-05-01 LAB — GLUCOSE, CAPILLARY
GLUCOSE-CAPILLARY: 141 mg/dL — AB (ref 65–99)
GLUCOSE-CAPILLARY: 50 mg/dL — AB (ref 65–99)
GLUCOSE-CAPILLARY: 116 mg/dL — AB (ref 65–99)
GLUCOSE-CAPILLARY: 169 mg/dL — AB (ref 65–99)

## 2016-05-01 LAB — COMPREHENSIVE METABOLIC PANEL
ALK PHOS: 65 U/L (ref 38–126)
ALT: 16 U/L (ref 14–54)
AST: 20 U/L (ref 15–41)
Albumin: 3.6 g/dL (ref 3.5–5.0)
Anion gap: 13 (ref 5–15)
BUN: 24 mg/dL — ABNORMAL HIGH (ref 6–20)
CALCIUM: 9.4 mg/dL (ref 8.9–10.3)
CO2: 21 mmol/L — ABNORMAL LOW (ref 22–32)
CREATININE: 1.87 mg/dL — AB (ref 0.44–1.00)
Chloride: 108 mmol/L (ref 101–111)
GFR, EST AFRICAN AMERICAN: 35 mL/min — AB (ref >60)
GFR, EST NON AFRICAN AMERICAN: 30 mL/min — AB (ref >60)
Glucose, Bld: 83 mg/dL (ref 65–99)
Potassium: 4.4 mmol/L (ref 3.5–5.1)
Sodium: 142 mmol/L (ref 135–145)
TOTAL PROTEIN: 6.6 g/dL (ref 6.5–8.1)
Total Bilirubin: 0.7 mg/dL (ref 0.3–1.2)

## 2016-05-01 LAB — CBC WITH DIFFERENTIAL/PLATELET
BASOS ABS: 0 10*3/uL (ref 0.0–0.1)
BASOS PCT: 0 %
EOS ABS: 0.1 10*3/uL (ref 0.0–0.7)
Eosinophils Relative: 1 %
HCT: 33 % — ABNORMAL LOW (ref 36.0–46.0)
HEMOGLOBIN: 11.1 g/dL — AB (ref 12.0–15.0)
Lymphocytes Relative: 23 %
Lymphs Abs: 1.9 10*3/uL (ref 0.7–4.0)
MCH: 31 pg (ref 26.0–34.0)
MCHC: 33.6 g/dL (ref 30.0–36.0)
MCV: 92.2 fL (ref 78.0–100.0)
Monocytes Absolute: 0.7 10*3/uL (ref 0.1–1.0)
Monocytes Relative: 8 %
NEUTROS PCT: 68 %
Neutro Abs: 5.6 10*3/uL (ref 1.7–7.7)
Platelets: 177 10*3/uL (ref 150–400)
RBC: 3.58 MIL/uL — AB (ref 3.87–5.11)
RDW: 14.4 % (ref 11.5–15.5)
WBC: 8.2 10*3/uL (ref 4.0–10.5)

## 2016-05-01 LAB — TROPONIN I
Troponin I: 0.07 ng/mL — ABNORMAL HIGH (ref <0.031)
Troponin I: 0.06 ng/mL — ABNORMAL HIGH (ref <0.031)

## 2016-05-01 MED ORDER — PROMETHAZINE HCL 25 MG PO TABS
25.0000 mg | ORAL_TABLET | Freq: Four times a day (QID) | ORAL | Status: DC | PRN
  Administered 2016-05-01: 25 mg via ORAL
  Filled 2016-05-01: qty 1

## 2016-05-01 MED ORDER — IPRATROPIUM-ALBUTEROL 0.5-2.5 (3) MG/3ML IN SOLN: 3.0000 mL | Freq: Four times a day (QID) | RESPIRATORY_TRACT | Status: DC | PRN

## 2016-05-01 MED ORDER — CARVEDILOL 25 MG PO TABS
25.0000 mg | ORAL_TABLET | Freq: Two times a day (BID) | ORAL | Status: DC
  Administered 2016-05-01: 12.5 mg via ORAL
  Filled 2016-05-01: qty 1

## 2016-05-01 MED ORDER — PANTOPRAZOLE SODIUM 40 MG IV SOLR
40.0000 mg | Freq: Two times a day (BID) | INTRAVENOUS | Status: DC
  Administered 2016-05-01: 40 mg via INTRAVENOUS
  Filled 2016-05-01: qty 40

## 2016-05-01 MED ORDER — DEXTROSE-NACL 5-0.9 % IV SOLN
INTRAVENOUS | Status: DC
  Administered 2016-05-01: 09:00:00 via INTRAVENOUS
  Filled 2016-05-01 (×2): qty 1000

## 2016-05-01 MED ORDER — LOSARTAN POTASSIUM 50 MG PO TABS
100.0000 mg | ORAL_TABLET | Freq: Every day | ORAL | Status: DC
  Administered 2016-05-01: 100 mg via ORAL
  Filled 2016-05-01: qty 2

## 2016-05-01 MED ORDER — BENZONATATE 100 MG PO CAPS
200.0000 mg | ORAL_CAPSULE | Freq: Three times a day (TID) | ORAL | Status: DC | PRN
  Administered 2016-05-01: 200 mg via ORAL
  Filled 2016-05-01: qty 2

## 2016-05-01 MED ORDER — HYDROMORPHONE HCL 1 MG/ML IJ SOLN
1.0000 mg | INTRAMUSCULAR | Status: DC | PRN
  Administered 2016-05-01 (×2): 1 mg via INTRAVENOUS
  Filled 2016-05-01 (×2): qty 1

## 2016-05-01 MED ORDER — INSULIN GLARGINE 100 UNIT/ML ~~LOC~~ SOLN
5.0000 [IU] | Freq: Every day | SUBCUTANEOUS | Status: DC
  Filled 2016-05-01: qty 0.05

## 2016-05-01 MED ORDER — SODIUM CHLORIDE 0.45 % IV SOLN
INTRAVENOUS | Status: DC
  Administered 2016-05-01: 02:00:00 via INTRAVENOUS

## 2016-05-01 MED ORDER — ONDANSETRON HCL 4 MG/2ML IJ SOLN
4.0000 mg | Freq: Once | INTRAMUSCULAR | Status: AC
  Administered 2016-05-01: 4 mg via INTRAVENOUS
  Filled 2016-05-01: qty 2

## 2016-05-01 MED ORDER — PROMETHAZINE HCL 12.5 MG RE SUPP
12.5000 mg | Freq: Four times a day (QID) | RECTAL | Status: DC | PRN
  Filled 2016-05-01: qty 1

## 2016-05-01 MED ORDER — HYDRALAZINE HCL 10 MG PO TABS
10.0000 mg | ORAL_TABLET | Freq: Two times a day (BID) | ORAL | Status: DC
  Administered 2016-05-01 (×2): 10 mg via ORAL
  Filled 2016-05-01 (×2): qty 1

## 2016-05-01 MED ORDER — DEXTROSE 50 % IV SOLN
INTRAVENOUS | Status: AC
  Administered 2016-05-01: 50 mL
  Filled 2016-05-01: qty 50

## 2016-05-01 MED ORDER — PANTOPRAZOLE SODIUM 40 MG IV SOLR
40.0000 mg | INTRAVENOUS | Status: DC
  Administered 2016-05-01: 40 mg via INTRAVENOUS
  Filled 2016-05-01: qty 40

## 2016-05-01 MED ORDER — ONDANSETRON HCL 4 MG/2ML IJ SOLN
4.0000 mg | Freq: Four times a day (QID) | INTRAMUSCULAR | Status: DC | PRN
  Administered 2016-05-01: 4 mg via INTRAVENOUS
  Filled 2016-05-01: qty 2

## 2016-05-01 NOTE — ED Notes (Signed)
Pt states she is not having any chest pain. She states her abdomen hurts and back.

## 2016-05-01 NOTE — Progress Notes (Signed)
Pt. IV was bleeding. Removed IV it was intact. Pt. Refuse to get another PIV. Asked MD if wants Portcath accessed no he said no access is fine, order is need.

## 2016-05-01 NOTE — Progress Notes (Signed)
Pt. Wanted pain meds while we were in report when she did not get them she told the NT-Toya she wanted to go home. [name] [name] was telling her that I was pulling her meds out the pyxis. When I went to the room the patient was naked washing up I told her to call me when she finishes.

## 2016-05-01 NOTE — Progress Notes (Signed)
Hypoglycemic Event  CBG: 50  Treatment: D50  Symptoms: none  Follow-up CBG: Time: 0740 CBG Result:116  Possible Reasons for Event: NPO  Comments/MD notified:yes    [name] [name], [employer]

## 2016-05-01 NOTE — ED Notes (Signed)
Dr Ortiz at bedside 

## 2016-05-01 NOTE — Discharge Summary (Signed)
Physician Discharge Summary  [name] [name] ZDG:[id number] DOB: [birth year] DOA: 04/30/2016  PCP: [name] Low, MD    Patient left AGAINST MEDICAL ADVICE  Admit date: 04/30/2016 Discharge date: 05/01/2016  Time spent: 45 minutes  Recommendations for Outpatient Follow-up:  1. Patient left against medical advise hopefully she'll follow-up with PCP.   Discharge Diagnoses:  Principal Problem:   Chest pain Active Problems:   TOBACCO DEPENDENCE   GERD   CKD (chronic kidney disease)   Essential hypertension   Diabetic gastroparesis associated with type 2 diabetes mellitus (HCC)   Discharge Condition: Stable  Diet recommendation: Carb modified  Filed Weights   04/30/16 1829  Weight: 66.225 kg (146 lb)    History of present illness:  Per Dr [name] [name] is a 50 y.o. female with medical history significant of pancreatitis, cyclic vomiting syndrome, hypertension, GERD, anxiety, depression, hyperlipidemia, type 2 diabetes, diabetic gastroparesis comes to the emergency department with complaints of burning-like chest pain "coming from her stomach" after she developed epigastric abdominal pain associated with nausea and about 15 episodes of emesis since this morning. She stated her chest pain is not radiated, associated with mild dyspnea, but palpitations, but denies dizziness, diaphoresis, PND, pitting edema of the lower extremities or orthopnea. She denied fever, but complained of chills, fatigue. She denied diarrhea, melena or hematochezia.  ED Course: In the ER, the patient received IV fluids, hydromorphone, lorazepam, ondansetron, IV hydralazine and reports relief. Workup showed an increased rate, but nonischemic EKG, mildly elevated troponin level, abnormal UA with proteinuria and microscopic hematuria, improved renal function and a normal CBC.  Hospital Course:     Patient left AGAINST MEDICAL ADVICE  #1 chest pain Patient represented with chest pain symptoms that seemed  atypical and seemed more to be secondary to GI. EKG done showed nonischemic changes. Patient was admitted to telemetry place on oxygen and cardiac enzymes cycled. Cardiac enzymes with minimally elevated at 0.04 to 0.07 to 0.06. Patient remained chest pain-free throughout the rest of the hospitalization. Chest x-ray obtained was unremarkable. 2-D echo was ordered and was pending at the time of discharge when patient left AGAINST MEDICAL ADVICE prior to 2-D echo being done and results be made available. Hopefully patient will follow-up with PCP as outpatient.  #2 hypertension Patient was maintained on home regimen of losartan and Coreg.  #3 diabetic gastroparesis associated with type 2 diabetes mellitus Patient was initially placed on Lantus and sliding scale insulin however patient noted to have a CBG of 50 however remained asymptomatic. Patient had been nothing by mouth. Patient was given some D50 with CBGs coming up to 116. Patient was placed on a diet. Long-acting Lantus was discontinued. Patient had no further hypoglycemic spells. Patient left AGAINST MEDICAL ADVICE and hopefully she'll follow-up with PCP as outpatient.    Procedures:  Chest x-ray 04/30/2016  Consultations:  None  Discharge Exam: Filed Vitals:   05/01/16 0441 05/01/16 0920  BP: 143/69 130/50  Pulse: 82   Temp: 98.4 F (36.9 C)   Resp:      General: NAD Cardiovascular: RRR Respiratory: CTAB  Discharge Instructions    Discharge Medication List as of 05/01/2016  1:57 PM    CONTINUE these medications which have NOT CHANGED   Details  carvedilol (COREG) 25 MG tablet Take 1 tablet (25 mg total) by mouth 2 (two) times daily with a meal., Starting 01/14/2016, Until Discontinued, Normal    hydrALAZINE (APRESOLINE) 10 MG tablet Take 1 tablet (10 mg total) by  mouth 3 (three) times daily., Starting 09/29/2015, Until Discontinued, Normal    insulin glargine (LANTUS) 100 UNIT/ML injection Inject 0.05-0.2 mLs (5-20 Units  total) into the skin daily., Starting 04/12/2016, Until Discontinued, Normal    losartan (COZAAR) 100 MG tablet Take 1 tablet (100 mg total) by mouth daily., Starting 03/23/2016, Until Discontinued, Normal    pregabalin (LYRICA) 50 MG capsule TAKE 1 TABLET BY MOUTH AT BEDTIME AS NEEDED, Print    promethazine (PHENERGAN) 12.5 MG suppository Place 1 suppository (12.5 mg total) rectally every 6 (six) hours as needed for nausea., Starting 01/21/2016, Until Discontinued, Normal    promethazine (PHENERGAN) 25 MG tablet Take 1 tablet (25 mg total) by mouth every 6 (six) hours as needed for nausea or vomiting., Starting 12/04/2015, Until Discontinued, Normal    ACCU-CHEK SOFTCLIX LANCETS lancets 1 each by Other route 4 (four) times daily. for testing, Starting 04/12/2016, Until Discontinued, Normal    blood glucose meter kit and supplies KIT Dispense based on patient and insurance preference. Use up to four times daily as directed. (FOR ICD-9 250.00, 250.01)., Normal    diclofenac sodium (VOLTAREN) 1 % GEL Apply 2-4 g topically 4 (four) times daily. Apply over affected area up to seven days, Starting 03/23/2016, Until Discontinued, Normal    polyethylene glycol (MIRALAX / GLYCOLAX) packet Take 17 g by mouth daily as needed. May increase in 2-3 days to 2 times daily, Until stooling regularly, Starting 06/02/2015, Until Discontinued, Normal       Allergies  Allergen Reactions  . Bee Venom Anaphylaxis    Required hospital visit to ER  . Acetaminophen Nausea And Vomiting  . Novolog [Insulin Aspart] Other (See Comments)    Muscles in feet cramp  . Other     Sorbaview causes rash  . Tegaderm Ag Mesh [Silver]     Blisters  . Erythromycin Nausea And Vomiting      The results of significant diagnostics from this hospitalization (including imaging, microbiology, ancillary and laboratory) are listed below for reference.    Significant Diagnostic Studies: Dg Chest 2 View  04/30/2016  CLINICAL DATA:   Cough, vomiting, and chest pain for 1 day. EXAM: CHEST  2 VIEW COMPARISON:  03/21/2016 FINDINGS: Right central venous catheter with tip over the upper SVC region. Normal heart size and pulmonary vascularity. No focal airspace disease or consolidation in the lungs. No blunting of costophrenic angles. No pneumothorax. Mediastinal contours appear intact. Surgical clips in the right upper quadrant. IMPRESSION: No active cardiopulmonary disease. Electronically Signed   By: Burman Nieves M.D.   On: 04/30/2016 21:20    Microbiology: No results found for this or any previous visit (from the past 240 hour(s)).   Labs: Basic Metabolic Panel:  Recent Labs Lab 04/30/16 1832 05/01/16 0510  NA 143 142  K 4.2 4.4  CL 110 108  CO2 21* 21*  GLUCOSE 87 83  BUN 26* 24*  CREATININE 1.99* 1.87*  CALCIUM 10.4* 9.4   Liver Function Tests:  Recent Labs Lab 04/30/16 1832 05/01/16 0510  AST 23 20  ALT 16 16  ALKPHOS 77 65  BILITOT 0.8 0.7  PROT 8.0 6.6  ALBUMIN 4.6 3.6    Recent Labs Lab 04/30/16 1832  LIPASE 27   No results for input(s): AMMONIA in the last 168 hours. CBC:  Recent Labs Lab 04/30/16 1832 05/01/16 0510  WBC 7.5 8.2  NEUTROABS  --  5.6  HGB 12.6 11.1*  HCT 37.5 33.0*  MCV 90.6 92.2  PLT  219 177   Cardiac Enzymes:  Recent Labs Lab 04/30/16 1832 04/30/16 2145 05/01/16 0148 05/01/16 0510  TROPONINI 0.04* 0.04* 0.07* 0.06*   BNP: BNP (last 3 results) No results for input(s): BNP in the last 8760 hours.  ProBNP (last 3 results) No results for input(s): PROBNP in the last 8760 hours.  CBG:  Recent Labs Lab 04/30/16 1849 05/01/16 0146 05/01/16 0708 05/01/16 0745 05/01/16 1141  GLUCAP 85 141* 50* 116* 169*       Signed:  Zaryiah Barz MD.  [facility] 05/01/2016, 6:12 PM

## 2016-05-01 NOTE — Progress Notes (Addendum)
Pt. Went by the nurses station and was leaving. I had other nurse stop her to get her AMA papers to get her to sign them during this process a nurse was using her foot to hold the elevator for them and she accused her of trying to kick her Paged MD.

## 2016-05-03 ENCOUNTER — Telehealth: Payer: Self-pay | Admitting: Family Medicine

## 2016-05-05 ENCOUNTER — Ambulatory Visit (INDEPENDENT_AMBULATORY_CARE_PROVIDER_SITE_OTHER): Payer: Medicare Other | Admitting: Obstetrics and Gynecology

## 2016-05-05 ENCOUNTER — Encounter: Payer: Self-pay | Admitting: Obstetrics and Gynecology

## 2016-05-05 VITALS — BP 138/55 | HR 57 | Temp 98.5°F | Wt 154.0 lb

## 2016-05-05 DIAGNOSIS — Z09 Encounter for follow-up examination after completed treatment for conditions other than malignant neoplasm: Secondary | ICD-10-CM

## 2016-05-05 DIAGNOSIS — R079 Chest pain, unspecified: Secondary | ICD-10-CM | POA: Diagnosis not present

## 2016-05-05 DIAGNOSIS — J209 Acute bronchitis, unspecified: Secondary | ICD-10-CM

## 2016-05-05 MED ORDER — DM-GUAIFENESIN ER 30-600 MG PO TB12: 1.0000 | ORAL_TABLET | Freq: Two times a day (BID) | ORAL | Status: DC | PRN

## 2016-05-05 NOTE — Progress Notes (Signed)
Subjective: Chief Complaint  Patient presents with  . Hospitalization Follow-up     HPI: [name] [name] is a 50 y.o. presenting to clinic today to discuss the following:  #Hospital follow-up: -went in for chest pain that she says was related to congestion/cough she had been having -Left AMA so did not get full work-up -presents today requesting that we finish working up her chest pain -states she has had a cough/congestion for about 1 week -she coughs so bad that it causes her to have chest pain and also her cyclic vomiting -phlegm that she produces is thick and colorless -denies fevers, sick contacts, sneezing, rhinorrhea, dyspnea, hemoptysis -does endorse headeaches (but not currently) - h/o migraines -patient states she is not having issues with vomiting anymore but still has cough/congestion   PMH - current every day smoker  ROS noted in HPI.  Past Medical, Surgical, Social, and Family History Reviewed & Updated per EMR.   Objective: BP 153/57 mmHg  Pulse 57  Temp(Src) 98.5 F (36.9 C) (Oral)  Wt 154 lb (69.854 kg) Vitals and nursing notes reviewed  Physical Exam  Constitutional: She is well-developed, well-nourished, and in no distress.  HENT:  Mouth/Throat: Oropharynx is clear and moist.  Cardiovascular: Normal rate, regular rhythm and intact distal pulses.   Pulmonary/Chest: Effort normal and breath sounds normal. She has no wheezes. She has no rales. She exhibits tenderness.    Results for orders placed or performed in visit on 05/05/16 (from the past 72 hour(s))  Troponin I     Status: None   Collection Time: 05/05/16 11:31 AM  Result Value Ref Range   Troponin I <0.01 <=0.05 ng/mL    Comment:   In accord with published recommendations, serial testing of Troponin I at intervals of 2 to 4 hours for up to 12 to 24 hours is suggested in order to corroborate a single Troponin I result. An elevated troponin alone is not sufficient to make the diagnosis  of MI.   Because of a new reagent formulation: 0.1-0.5 ng/mL: Suggests onset of myocardial damage 0.6-1.5 ng/mL: Suggests the presence of AMI   ** Please note change in reference range(s). **      *Note: Due to a large number of results and/or encounters for the requested time period, some results have not been displayed. A complete set of results can be found in Results Review.    Assessment/Plan: 1. Chest pain, unspecified chest pain type Patient following up in clinic after hospitalization for chest pain. Unfortunately she left AMA so full work-up was not completed. From reviewing hospital documentation chest pain was atypical and providers thought associated with her GERD from excess vomiting. EKG reviewed and had no ischemic changes. Troponins were initially elevated but started to come down. CXR was unremarkable. Echo was ordered but patient left before receiving. Order placed for echo and repeat troponin. Patient without active chest pain currently at today's visit. Only occurs after coughing. - Troponin I - Echocardiogram; Future  2. Acute bronchitis, unspecified organism Cough and congestion symtpoms seem consistent with bronchitis in this smoker. Patient without systemic signs of illness. Most likely viral etiology. Patient counseled on tobacco cessation. Conservative treatment at this time with cough suppressant. No indication for antibiotics. Return precautions discussed.    Orders Placed This Encounter  Procedures  . Troponin I  . Echocardiogram    Standing Status: Future     Number of Occurrences:      Standing Expiration Date: 08/05/2017  Order Specific Question:  Where should this test be performed    Answer:  [facility])    Order Specific Question:  Does the patient weigh less than or greater than 250 lbs?    Answer:  Patient weighs less than 250 lbs    Order Specific Question:  Complete or Limited study?    Answer:  Complete    Order  Specific Question:  Does the patient have a known history of hypersensitivity to Perflutren?    Answer:  No - With Imaging Enhancing Agent    Order Specific Question:  Expected Date:    Answer:  ASAP    Order Specific Question:  Reason for exam-Echo    Answer:  Chest Pain  786.50 / R07.9    Order Specific Question:  Reason for exam-Echo    Answer:  Elevated Troponin    Meds ordered this encounter  Medications  . dextromethorphan-guaiFENesin (MUCINEX DM) 30-600 MG 12hr tablet    Sig: Take 1 tablet by mouth 2 (two) times daily as needed for cough.    Dispense:  60 tablet    Refill:  [place], DO 05/05/2016, 10:58 AM PGY-2, [place] [facility]

## 2016-05-05 NOTE — Patient Instructions (Addendum)
   No indication for antibiotics at this time. Monitor for fever or worsening symptoms. Also see below  Echo ordered  Medication for cough sent to pharmacy  Acute Bronchitis Bronchitis is when the airways that extend from the windpipe into the lungs get red, puffy, and painful (inflamed). Bronchitis often causes thick spit (mucus) to develop. This leads to a cough. A cough is the most common symptom of bronchitis. In acute bronchitis, the condition usually begins suddenly and goes away over time (usually in 2 weeks). Smoking, allergies, and asthma can make bronchitis worse. Repeated episodes of bronchitis may cause more lung problems. HOME CARE  Rest.  Drink enough fluids to keep your pee (urine) clear or pale yellow (unless you need to limit fluids as told by your doctor).  Only take over-the-counter or prescription medicines as told by your doctor.  Avoid smoking and secondhand smoke. These can make bronchitis worse. If you are a smoker, think about using nicotine gum or skin patches. Quitting smoking will help your lungs heal faster.  Reduce the chance of getting bronchitis again by:  Washing your hands often.  Avoiding people with cold symptoms.  Trying not to touch your hands to your mouth, nose, or eyes.  Follow up with your doctor as told. GET HELP IF: Your symptoms do not improve after 1 week of treatment. Symptoms include:  Cough.  Fever.  Coughing up thick spit.  Body aches.  Chest congestion.  Chills.  Shortness of breath.  Sore throat. GET HELP RIGHT AWAY IF:   You have an increased fever.  You have chills.  You have severe shortness of breath.  You have bloody thick spit (sputum).  You throw up (vomit) often.  You lose too much body fluid (dehydration).  You have a severe headache.  You faint. MAKE SURE YOU:   Understand these instructions.  Will watch your condition.  Will get help right away if you are not doing well or get worse.     This information is not intended to replace advice given to you by your health care provider. Make sure you discuss any questions you have with your health care provider.   Document Released: 05/31/2008 Document Revised: 08/15/2013 Document Reviewed: 06/05/2013 Elsevier Interactive Patient Education [employer].

## 2016-05-06 ENCOUNTER — Encounter: Payer: Self-pay | Admitting: Pharmacist

## 2016-05-06 ENCOUNTER — Ambulatory Visit (INDEPENDENT_AMBULATORY_CARE_PROVIDER_SITE_OTHER): Payer: Medicare Other | Admitting: Pharmacist

## 2016-05-06 VITALS — BP 171/67 | HR 68 | Wt 154.1 lb

## 2016-05-06 DIAGNOSIS — F418 Other specified anxiety disorders: Secondary | ICD-10-CM | POA: Diagnosis not present

## 2016-05-06 DIAGNOSIS — I10 Essential (primary) hypertension: Secondary | ICD-10-CM | POA: Diagnosis not present

## 2016-05-06 DIAGNOSIS — E1149 Type 2 diabetes mellitus with other diabetic neurological complication: Secondary | ICD-10-CM | POA: Diagnosis not present

## 2016-05-06 LAB — TROPONIN I

## 2016-05-06 MED ORDER — GLUCOSE BLOOD VI STRP: ORAL_STRIP | Status: DC

## 2016-05-06 NOTE — Assessment & Plan Note (Signed)
Hypertension longstanding.  Patient denies adherence with medication. Control is suboptimal due to lack of adherence.  Patient reports that she takes carvedilol 12.5mg  daily and hydralazine 10mg  daily. Counseled patient to take carvedilol 12.5mg  twice daily and hydralazine 10mg  three times daily.

## 2016-05-06 NOTE — Progress Notes (Signed)
Dr. [name] requested a [place].   Presenting Issue:  Per Dr. [name] - more emotional than usual with a lot reportedly going on.  Patient affirmed this with an emphasis on a break-up of a 13 year relationship and impending oral surgery (May 25th).  Report of symptoms:  Did not report on symptoms.  Tearful in the room.  Not sleeping well.  Not feeling well.  Duration of CURRENT symptoms:  She reported is has been a "hard week" within a very "hard year."  Impact on function:  Multiple things impact function including illnesses and finances.  Would like to go and get her HS diploma but her health issues are a barrier.  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    Psychiatric History - Diagnoses: Anxiety and depression listed in the record but patient is not aware of where this diagnosis came from. - Hospitalizations: Did not assess. - Pharmacotherapy: None currently.  She notes Ativan in the past.  Isn't sure about other medications - thought Wellbutrin sounded familiar.  Says someone wants to restart amitriptyline but she is not interested.  Made her feel bad.  I reviewed her historical medicines and found: Venlafaxine 75 mg Cymbalta 30 mg Remeron 30 mg Ativan 0.5 mg  - Outpatient therapy: Has had several times.  Sounds helpful while getting it but people just "leave" and then she has to restart with someone new.  Was last at "[facility] which is apparently mental health.  Liked her counselor there but he left.    Family history of psychiatric issues:  Did not assess.  Current and history of substance use:  Smokes cigarettes and marijuana.  The marijuana reportedly stimulates her appetite and helps her relax.  She would like to quit both secondary to "religion" and her grandchildren.  Several of her family members have asthma and se doesn't want to be responsible for them having a bad time with their health.    Medical conditions that might explain or  contribute to symptoms:  Problem list is extensive.    PHQ-9:  Reviewed past scores.  PHQ-2 from yesterday was a 1 and did not prompt further questioning.  I suspect this was an underreport based on her presentation today.    Assessment / Plan / Recommendations: Denied suicidal ideation.  She states she has been there in the past and is not there presently.  Did not get clarification on diagnosis today.  Sounds like the break-up is a major stressor both because of the loss of the specific relationship as well as she does not seem to have much social contact now.    Attempts at determining what might be helpful were met with "I don't know." Barriers to therapy: 1) Too many doctor's appointments already 2) The therapists always leave and I am tired of rehashing things. She did mention she wants help "getting over this breakup."  We agreed I would touch base with her on her follow-up with Dr. [name] or call her in a week to see when she might be able to come in.    Barriers to medication management: 1) No specific diagnosis as best I can tell 2) She may have a preference for benzodiazepines On the positive side, it does not seem like she has been tried on an exhaustive list of medications.  With further history taking, we might be able to arrive at a more specific diagnosis that might match a class of medication.  If we meet again, I will gauge interest.    Her coping skills / strengths presently: Reading her bible and devotional daily. Talking to her brother who lives in [place] twice a month She does sound like she cares about her 27 grandchildren very much She is interested in getting her HS diploma  I asked her to schedule a Follow-up with Dr. [name] before her oral surgery on 5/25 with the idea that I would tag team that and schedule something AFTER her surgery.  I don't see that she made that appointment.  I will call her in one week to check in.

## 2016-05-06 NOTE — Progress Notes (Signed)
Patient ID: [name] [name], female   DOB: January 03, 1966, 50 y.o.   MRN: [id number] Reviewed: Agree with Dr. Graylin Shiver documentation and management.

## 2016-05-06 NOTE — Patient Instructions (Addendum)
Thank you for coming in today! It was great to see you.  Mucus Relief Equate, check the contents for GUAIFENESIN (this helps to decrease mucus) and DEXTROMETHORPHAN (this helps with cough).  Take carvedilol 12.5mg  twice daily.  Take hydralazine 10mg  three times a day.

## 2016-05-06 NOTE — Assessment & Plan Note (Signed)
Diabetes longstanding first diagnosed 30 years ago. Patient reports hypoglycemic events and is able to verbalize appropriate hypoglycemia management plan. Reports that she uses juice or a sugary drink any time that she feels like her blood sugar may be low. Patient denies adherence with medication. Control is suboptimal due to adherence issues, lack of blood glucose monitoring, and problems obtaining medications. She was emotional during this visit and said that she often just doesn't have the time or the money to take care of herself. She also felt like most people do not notice when she is getting sick. She is going to meet with behavioral health today as well.  Next A1C anticipated in June. Continue insulin glargine (LANTUS) 5 units daily; 6 units if BG > 150. Reevaluate CBGs at next visit.

## 2016-05-06 NOTE — Progress Notes (Signed)
S:    Patient arrives in good spirits initially and is ambulating without assistance.  Presents for diabetes evaluation, education, and management at the request of Dr. Berkley Harvey. Patient was last seen by Primary Care Provider on 04/12/16.   Patient became tearful during visit when smoking cessation was discussed.  Consulted with behavioral health during visit.    Patient reports Diabetes was diagnosed 30 years ago. She reports she has been taking insulin for 10 years.    Patient denies adherence with medications.  Current diabetes medications include: insulin glargine 5-11 units Current hypertension medications include: carvedilol 12.5mg  daily, hydralazine 10mg  daily, losartan 100mg  daily  Patient reports a few (<1 a month) hypoglycemic events. Per her glucose meter, all readings since last visit have been between 100-200.   Patient denies nocturia.  Patient denies neuropathy. Patient denies visual changes. Patient denies self foot exams.   O:  Lab Results  Component Value Date   HGBA1C 8.1* 03/21/2016   There were no vitals filed for this visit.  Home CBG range: 118-263   A/P: Diabetes longstanding first diagnosed 30 years ago. Patient reports hypoglycemic events and is able to verbalize appropriate hypoglycemia management plan. Reports that she uses juice or a sugary drink any time that she feels like her blood sugar may be low. Patient denies adherence with medication. Control is suboptimal due to adherence issues, lack of blood glucose monitoring, and problems obtaining medications. She was emotional during this visit and said that she often just doesn't have the time or the money to take care of herself. She also felt like most people do not notice when she is getting sick. She is going to meet with behavioral health today as well.  Next A1C anticipated in June. Continue insulin glargine (LANTUS) 5 units daily; 6 units if BG > 150. Reevaluate CBGs at next visit.    Hypertension  longstanding.  Patient denies adherence with medication. Control is suboptimal due to lack of adherence.  Patient reports that she takes carvedilol 12.5mg  daily and hydralazine 10mg  daily. Counseled patient to take carvedilol 12.5mg  twice daily and hydralazine 10mg  three times daily.  Written patient instructions provided.  Total time in face to face counseling 45 minutes.   Follow up in Pharmacist Clinic Visit within the next week.   Patient seen with Zettie Cooley, PharmD Candidate, Cruz Condon, PharmD Resident and Elisabeth Most, PharmD Resident.

## 2016-05-07 ENCOUNTER — Encounter: Payer: Self-pay | Admitting: Obstetrics and Gynecology

## 2016-05-13 ENCOUNTER — Ambulatory Visit: Payer: Self-pay | Admitting: Family Medicine

## 2016-05-14 ENCOUNTER — Telehealth: Payer: Self-pay | Admitting: Psychology

## 2016-05-14 NOTE — Telephone Encounter (Signed)
I called patient to check-in after meeting her 05/06/2016.  She was seen by Dr. [name] and IC was called in.  [name] wanted her to be seen in two weeks but she has oral surgery scheduled for 05/20/16 and was focused on that.  I left a VM.  Will await patient follow-up.

## 2016-05-18 ENCOUNTER — Other Ambulatory Visit: Payer: Self-pay | Admitting: *Deleted

## 2016-05-19 ENCOUNTER — Ambulatory Visit (HOSPITAL_COMMUNITY): Payer: Medicare Other | Attending: Cardiovascular Disease

## 2016-05-19 DIAGNOSIS — I119 Hypertensive heart disease without heart failure: Secondary | ICD-10-CM | POA: Diagnosis not present

## 2016-05-19 DIAGNOSIS — R079 Chest pain, unspecified: Secondary | ICD-10-CM | POA: Diagnosis not present

## 2016-05-19 DIAGNOSIS — I340 Nonrheumatic mitral (valve) insufficiency: Secondary | ICD-10-CM | POA: Diagnosis not present

## 2016-05-19 DIAGNOSIS — I071 Rheumatic tricuspid insufficiency: Secondary | ICD-10-CM | POA: Insufficient documentation

## 2016-05-19 DIAGNOSIS — Z720 Tobacco use: Secondary | ICD-10-CM | POA: Diagnosis not present

## 2016-05-19 DIAGNOSIS — E119 Type 2 diabetes mellitus without complications: Secondary | ICD-10-CM | POA: Diagnosis not present

## 2016-05-19 NOTE — Telephone Encounter (Signed)
Patient called back.  She was confused about follow-up with Dr. [name] and myself and did not schedule an appointment.  She says she has an appointment with the cardiologist today.  She has her oral surgery tomorrow.  She is most concerned about tolerating pain medicine (stomach).  She was focused on this during our conversation.  I suggested she discuss with her oral surgeon.    She was also focused on her DM supplies stating that she is out of her syringes.  She did get a bag of 10 needles but those will run out soon (has 4-5 left).  She has the other things she needs.  I told her I would forward to Dr. [name].  Focused in on what would be most helpful moving forward.  She said she would like to follow up with Dr. [name] and me.  She requested a phone call in one week.  I asked if I could schedule an appointment with Dr. [name] - he wanted to see her back in two weeks but she wished for a phone call in a week instead.  He can follow by phone if necessary.

## 2016-05-20 ENCOUNTER — Other Ambulatory Visit: Payer: Self-pay | Admitting: Family Medicine

## 2016-05-20 NOTE — Telephone Encounter (Signed)
Need refills on her syringes.  Please send to pharmacy.

## 2016-05-21 MED ORDER — "INSULIN SYRINGE-NEEDLE U-100 31G X 15/64"" 0.3 ML MISC": 1.0000 | Freq: Every day | Status: DC

## 2016-05-25 ENCOUNTER — Other Ambulatory Visit: Payer: Self-pay | Admitting: *Deleted

## 2016-05-26 NOTE — Telephone Encounter (Signed)
Called patient to follow-up per her request.  She said the oral surgery went well.  Swelling has gone down.  Still has some pain.  Discussed about scheduling a follow-up with Dr. [name] and meeting with me.  First time Dr. [name] and I are in clinic at the same time is not until late June so I suggested she schedule soon with him since he wanted to see her back relatively quickly.  Scheduled for 6/6 at 2:15.  She seems ambivalent about attending appointments with Dr. [name] and with me.  She mentioned being here two hours the last time and not wanting to do that again.  If she is interested in meeting with me, she can schedule with a provider on a Monday or Thursday morning or call me directly to schedule.

## 2016-06-01 ENCOUNTER — Ambulatory Visit (INDEPENDENT_AMBULATORY_CARE_PROVIDER_SITE_OTHER): Payer: Medicare Other | Admitting: Pharmacist

## 2016-06-01 ENCOUNTER — Encounter: Payer: Self-pay | Admitting: Pharmacist

## 2016-06-01 VITALS — BP 157/63 | HR 56 | Wt 150.2 lb

## 2016-06-01 DIAGNOSIS — E1322 Other specified diabetes mellitus with diabetic chronic kidney disease: Secondary | ICD-10-CM

## 2016-06-01 DIAGNOSIS — I10 Essential (primary) hypertension: Secondary | ICD-10-CM

## 2016-06-01 DIAGNOSIS — E1149 Type 2 diabetes mellitus with other diabetic neurological complication: Secondary | ICD-10-CM | POA: Diagnosis not present

## 2016-06-01 DIAGNOSIS — IMO0002 Reserved for concepts with insufficient information to code with codable children: Secondary | ICD-10-CM

## 2016-06-01 DIAGNOSIS — F17200 Nicotine dependence, unspecified, uncomplicated: Secondary | ICD-10-CM

## 2016-06-01 DIAGNOSIS — E1365 Other specified diabetes mellitus with hyperglycemia: Secondary | ICD-10-CM

## 2016-06-01 DIAGNOSIS — N183 Chronic kidney disease, stage 3 (moderate): Secondary | ICD-10-CM

## 2016-06-01 MED ORDER — CARVEDILOL 25 MG PO TABS: 12.5000 mg | ORAL_TABLET | Freq: Two times a day (BID) | ORAL | Status: DC

## 2016-06-01 MED ORDER — LOSARTAN POTASSIUM 100 MG PO TABS: 100.0000 mg | ORAL_TABLET | Freq: Every day | ORAL | Status: DC

## 2016-06-01 NOTE — Assessment & Plan Note (Signed)
Longstanding neuropathic pain with recent acute pain post tooth extraction (8 teeth) managed by Oxycodone.  Patient has stopped taking Pregabalin while she was taking her narcotic.  Plans to restart pregabalin (Lyrica) but needs prescription.  Informed patient we would forward prescription request to new PCP - Dr. McDiarmid.

## 2016-06-01 NOTE — Assessment & Plan Note (Signed)
Patient reports that she wants to quit smoking and has agreed to smoke no more than 2 cigarettes per day until next visit. Follow-up with progress at next visit.

## 2016-06-01 NOTE — Patient Instructions (Addendum)
Thank you for coming in today!  Schedule follow up with Dr. McDiarmid or Dr. [name] in 3 weeks.  No changes today

## 2016-06-01 NOTE — Assessment & Plan Note (Signed)
Diabetes longstanding currently fairly well controlled. Patient reports adherence with medication. Wide ranges in home CBG values most likely related to current stress/pain of recent tooth extraction and/or changes in diet causing weight loss.  Continued basal insulin Lantus (insulin glargine) 6-8 units daily.  Next A1C anticipated in about 3 weeks.

## 2016-06-01 NOTE — Assessment & Plan Note (Signed)
Hypertension longstanding currently uncontrolled.  Patient reports adherence with medication with the exception of losartan because she has been out for about 2 weeks ago. Control is suboptimal due to not taking losartan and pain.

## 2016-06-01 NOTE — Progress Notes (Signed)
    S:    Patient arrives in generally good spirits. Presents for diabetes evaluation, education, and management at the request of Dr Berkley Harvey. Patient was last seen by Primary Care Provider on 4/17.   Patient reports changes in diet due to recent tooth extraction (8 teeth extracted)  and 10 lb weight loss.  Patient reports adherence with medications.  Current diabetes medications include: Lantus 5-20 units daily (sliding scale) Current hypertension medications include: Losartan 100 mg PO daily (patient reports running out of this medication- hasn't taken in about 2 weeks), Carvedilol 12.5 mg BID  Pain 6.5/10 today  O:  Lab Results  Component Value Date   HGBA1C 8.1* 03/21/2016   Filed Vitals:   06/01/16 1451 06/01/16 1456  BP: 170/61 157/63  Pulse: 56 56    CBG: Range: 97-291 30 day average: 174 7 day avg: 216  A/P: Diabetes longstanding currently fairly well controlled. Patient reports adherence with medication. Wide ranges in home CBG values most likely related to current stress/pain of recent tooth extraction and/or changes in diet causing weight loss.  Continued basal insulin Lantus (insulin glargine) 6-8 units daily.  Next A1C anticipated in about 3 weeks.    Hypertension longstanding currently uncontrolled.  Patient reports adherence with medication with the exception of losartan because she has been out for about 2 weeks ago. Control is suboptimal due to not taking losartan and pain.  Patient reports that she wants to quit smoking and has agreed to smoke no more than 2 cigarettes per day until next visit. Follow-up with progress at next visit.  Longstanding neuropathic pain with recent acute pain post tooth extraction (8 teeth) managed by Oxycodone.  Patient has stopped taking Pregabalin while she was taking her narcotic.  Plans to restart pregabalin (Lyrica) but needs prescription.  Informed patient we would forward prescription request to new PCP - Dr. McDiarmid.    Written patient instructions provided.  Total time in face to face counseling 30 minutes.   Follow up in Pharmacist Clinic Visit after visit with Dr. McDiarmid if available AND sooner (3 weeks) if Dr. McDiarmid is unavailable.   Patient seen with Mikael Spray, PharmD Candidate and Joya San, PharmD Resident.

## 2016-06-02 NOTE — Progress Notes (Signed)
Patient ID: [name] [name], female   DOB: 06-24-1966, 50 y.o.   MRN: [id number] Reviewed: Agree with Dr. Graylin Shiver documentation and management.

## 2016-06-21 ENCOUNTER — Ambulatory Visit (INDEPENDENT_AMBULATORY_CARE_PROVIDER_SITE_OTHER): Payer: Medicare Other | Admitting: Pharmacist

## 2016-06-21 ENCOUNTER — Encounter: Payer: Self-pay | Admitting: Pharmacist

## 2016-06-21 VITALS — BP 157/78 | HR 55 | Wt 149.0 lb

## 2016-06-21 DIAGNOSIS — F17200 Nicotine dependence, unspecified, uncomplicated: Secondary | ICD-10-CM

## 2016-06-21 DIAGNOSIS — E1165 Type 2 diabetes mellitus with hyperglycemia: Secondary | ICD-10-CM | POA: Diagnosis not present

## 2016-06-21 DIAGNOSIS — I10 Essential (primary) hypertension: Secondary | ICD-10-CM | POA: Diagnosis not present

## 2016-06-21 MED ORDER — CARVEDILOL 25 MG PO TABS: 12.5000 mg | ORAL_TABLET | Freq: Two times a day (BID) | ORAL | Status: DC

## 2016-06-21 MED ORDER — HYDRALAZINE HCL 10 MG PO TABS: 10.0000 mg | ORAL_TABLET | Freq: Three times a day (TID) | ORAL | Status: DC

## 2016-06-21 MED ORDER — LOSARTAN POTASSIUM 100 MG PO TABS: 100.0000 mg | ORAL_TABLET | Freq: Every day | ORAL | Status: DC

## 2016-06-21 MED ORDER — CLONIDINE HCL 0.1 MG PO TABS
0.1000 mg | ORAL_TABLET | Freq: Once | ORAL | Status: AC
Start: 2016-06-21 — End: 2016-06-21
  Administered 2016-06-21: 0.1 mg via ORAL

## 2016-06-21 MED FILL — hydrALAZINE HCL 10 MG TABS: 10 | 10 days supply | Qty: 30 | Fill #0

## 2016-06-21 MED FILL — CARVEDILOL 25 MG TABLET: 25 | 30 days supply | Qty: 30 | Fill #0

## 2016-06-21 NOTE — Progress Notes (Signed)
S:    Patient arrives in fair spirits but complaining that she doesn't feel well. She states that she has had a headache on most days since her tooth extraction about 1 month ago. She also reports difficulty eating due to tooth pain and some sinus congestion. Patient became tearfully when asked to explain her symptoms further. She said that they are discussing further tooth extraction but patient is very hesitant. She states she is traveling to [place] for a family reunion next month and needs to be feeling better by then.   Patient denies adherence with medications (specifically with HTN meds). States that she cannot afford many of her medications because she only gets paid once per month. States she gets paid again on Friday (6/30). Her son helps her with her medications when he comes home but he has not been home in quite awhile. Current diabetes medications include: Lantus - takes 7-8 units once daily. Reported taking as many as 22 units.  Current hypertension medications include: Carvedilol 12.5 BID, Hydralazine 10 mg TID, Losartan 100 mg once daily.  Patient did not bring meter to visit but reports that sugars have been "high". She says she sees numbers in the 100s and 200s mostly but has seen as high as 300. Patient reports one hypoglycemic events (BG of 67) about 3 weeks ago.   Patient reported dietary habits: Patient reports that she is not eating very frequently due to the pain despite feeling hungry. She says when she does eat she tries to eat sandwiches.   Patient reports that she is smoking less since tooth extraction. She states that she had 1 cigarette with coffee this morning but that she has enjoyed cigarettes less since she hasn't been eating.   O:  Lab Results  Component Value Date   HGBA1C 8.1* 03/21/2016   Filed Vitals:   06/21/16 0940 06/21/16 1030  BP: 189/67 157/78  Pulse: 61 55   10 year ASCVD risk: 26.4%  A/P: Diabetes longstanding currently  uncontrolled. Patient denies hypoglycemic events and is able to verbalize appropriate hypoglycemia management plan. Patient reports adherence with insulin. Control is suboptimal due to stress, pain, and infection associated with tooth extraction. Increased dose of basal insulin Lantus (insulin glargine) to 12 units if BG > 200 or 10 units if BG < 200. Instructed the patient to call the office if BG < 90. Next A1C anticipated at next visit. Anticipate that controlling BG will allow mouth to heal faster.   ASCVD risk greater than 7.5%. However, it is unlikely that the patient will be able to afford a statin or aspirin at this time. Re-visit if patient reports ability to afford medications in the future.  Hypertension longstanding currently uncontrolled.  Patient denies adherence with medication due to inability to afford medication. Patient has not been taking Carvedilol or Losartan but has been using Hydralazine. Control is suboptimal due to lack of adherence and acute stress/pain. Repeat BP was 189/67 so the patient was given Clonidine in office. Prescriptions for Carvedilol and Hydralazine were sent to [place] and picked up using [facility]. Patient was then given Carvedilol 12.5 mg and Hydralazine 10 mg in the office. Repeat BP after meds were given was 157/78. Repeat pulse was slightly low at 55.   Chronic Tobacco Abuse who is currently willing to make quit attempt as she has smoked minimally since teeth extraction 1 month ago. Encouraged patient to stop smoking since she has already decreased smoking  since tooth extraction. Counseled patient that tobacco cessation will allow mouth to heal faster.  Written patient instructions provided. Total time in face to face counseling 45 minutes.   Follow up in with Dr. McDiarmid in 3 weeks   Patient seen with Mikael Spray, PharmD Candidate.

## 2016-06-21 NOTE — Progress Notes (Signed)
Patient ID: [name] [name], female   DOB: 1966-06-27, 50 y.o.   MRN: [id number] Reviewed: Agree with Dr. Graylin Shiver documentation and management.

## 2016-06-21 NOTE — Addendum Note (Signed)
Addended by: Leonia Corona R on: 06/21/2016 01:43 PM   Modules accepted: Orders

## 2016-06-21 NOTE — Assessment & Plan Note (Signed)
Hypertension longstanding currently uncontrolled.  Patient denies adherence with medication due to inability to afford medication. Patient has not been taking Carvedilol or Losartan but has been using Hydralazine. Control is suboptimal due to lack of adherence and acute stress/pain. Repeat BP was 189/67 so the patient was given Clonidine in office. Prescriptions for Carvedilol and Hydralazine were sent to [place] and picked up using [facility]. Patient was then given Carvedilol 12.5 mg and Hydralazine 10 mg in the office. Repeat BP after meds were given was 157/78. Repeat pulse was slightly low at 55.

## 2016-06-21 NOTE — Patient Instructions (Addendum)
Thank you for coming in today.  Please start injecting 12 units of Lantus when blood sugar is greater than 200. If blood sugar is less than 200, inject 10 units of Lantus. Continue taking first thing in the morning.   If you start seeing low sugars (below 90), call the office.  Continue taking Hydralazine 10 mg three times daily and Carvedilol 25 mg twice daily. Re-start Losartan 100 mg once daily as soon as you can pick it up.   Follow-up with Dr. McDiarmid in the next 3 weeks.

## 2016-06-21 NOTE — Assessment & Plan Note (Signed)
Chronic Tobacco Abuse who is currently willing to make quit attempt as she has smoked minimally since teeth extraction 1 month ago. Encouraged patient to stop smoking since she has already decreased smoking since tooth extraction. Counseled patient that tobacco cessation will allow mouth to heal faster.

## 2016-06-21 NOTE — Assessment & Plan Note (Signed)
Diabetes longstanding currently uncontrolled. Patient denies hypoglycemic events and is able to verbalize appropriate hypoglycemia management plan. Patient reports adherence with insulin. Control is suboptimal due to stress, pain, and infection associated with tooth extraction. Increased dose of basal insulin Lantus (insulin glargine) to 12 units if BG > 200 or 10 units if BG < 200. Instructed the patient to call the office if BG < 90. Next A1C anticipated at next visit. Anticipate that controlling BG will allow mouth to heal faster.

## 2016-07-02 ENCOUNTER — Encounter (HOSPITAL_COMMUNITY): Payer: Self-pay | Admitting: Emergency Medicine

## 2016-07-02 ENCOUNTER — Observation Stay (HOSPITAL_COMMUNITY)
Admission: EM | Admit: 2016-07-02 | Discharge: 2016-07-04 | Disposition: A | Discharge status: Home / Self Care | Payer: Medicare Other | Attending: Family Medicine | Admitting: Family Medicine

## 2016-07-02 DIAGNOSIS — R112 Nausea with vomiting, unspecified: Secondary | ICD-10-CM | POA: Diagnosis not present

## 2016-07-02 DIAGNOSIS — R1111 Vomiting without nausea: Secondary | ICD-10-CM | POA: Diagnosis not present

## 2016-07-02 DIAGNOSIS — N183 Chronic kidney disease, stage 3 (moderate): Secondary | ICD-10-CM | POA: Insufficient documentation

## 2016-07-02 DIAGNOSIS — R1110 Vomiting, unspecified: Secondary | ICD-10-CM | POA: Diagnosis present

## 2016-07-02 DIAGNOSIS — R109 Unspecified abdominal pain: Secondary | ICD-10-CM | POA: Diagnosis present

## 2016-07-02 DIAGNOSIS — E785 Hyperlipidemia, unspecified: Secondary | ICD-10-CM | POA: Insufficient documentation

## 2016-07-02 DIAGNOSIS — F17210 Nicotine dependence, cigarettes, uncomplicated: Secondary | ICD-10-CM | POA: Insufficient documentation

## 2016-07-02 DIAGNOSIS — F1210 Cannabis abuse, uncomplicated: Secondary | ICD-10-CM | POA: Diagnosis not present

## 2016-07-02 DIAGNOSIS — I252 Old myocardial infarction: Secondary | ICD-10-CM | POA: Insufficient documentation

## 2016-07-02 DIAGNOSIS — Z794 Long term (current) use of insulin: Secondary | ICD-10-CM | POA: Diagnosis not present

## 2016-07-02 DIAGNOSIS — Z79899 Other long term (current) drug therapy: Secondary | ICD-10-CM | POA: Diagnosis not present

## 2016-07-02 DIAGNOSIS — K2970 Gastritis, unspecified, without bleeding: Secondary | ICD-10-CM | POA: Diagnosis not present

## 2016-07-02 DIAGNOSIS — I129 Hypertensive chronic kidney disease with stage 1 through stage 4 chronic kidney disease, or unspecified chronic kidney disease: Secondary | ICD-10-CM | POA: Insufficient documentation

## 2016-07-02 DIAGNOSIS — E1143 Type 2 diabetes mellitus with diabetic autonomic (poly)neuropathy: Secondary | ICD-10-CM | POA: Diagnosis not present

## 2016-07-02 DIAGNOSIS — R1115 Cyclical vomiting syndrome unrelated to migraine: Secondary | ICD-10-CM

## 2016-07-02 DIAGNOSIS — G43A1 Cyclical vomiting, intractable: Secondary | ICD-10-CM | POA: Diagnosis not present

## 2016-07-02 HISTORY — DX: Vomiting, unspecified: R11.10

## 2016-07-02 LAB — CBC WITH DIFFERENTIAL/PLATELET
Basophils Absolute: 0 10*3/uL (ref 0.0–0.1)
Basophils Relative: 0 %
Eosinophils Absolute: 0.1 10*3/uL (ref 0.0–0.7)
Eosinophils Relative: 1 %
HCT: 32.6 % — ABNORMAL LOW (ref 36.0–46.0)
Hemoglobin: 11.7 g/dL — ABNORMAL LOW (ref 12.0–15.0)
Lymphocytes Relative: 26 %
Lymphs Abs: 2.9 10*3/uL (ref 0.7–4.0)
MCH: 31.2 pg (ref 26.0–34.0)
MCHC: 35.9 g/dL (ref 30.0–36.0)
MCV: 86.9 fL (ref 78.0–100.0)
Monocytes Absolute: 0.8 10*3/uL (ref 0.1–1.0)
Monocytes Relative: 7 %
Neutro Abs: 7.4 10*3/uL (ref 1.7–7.7)
Neutrophils Relative %: 66 %
Platelets: 221 10*3/uL (ref 150–400)
RBC: 3.75 MIL/uL — ABNORMAL LOW (ref 3.87–5.11)
RDW: 14.3 % (ref 11.5–15.5)
WBC: 11.1 10*3/uL — ABNORMAL HIGH (ref 4.0–10.5)

## 2016-07-02 LAB — COMPREHENSIVE METABOLIC PANEL
ALT: 14 U/L (ref 14–54)
AST: 20 U/L (ref 15–41)
Albumin: 4.7 g/dL (ref 3.5–5.0)
Alkaline Phosphatase: 78 U/L (ref 38–126)
Anion gap: 11 (ref 5–15)
BUN: 33 mg/dL — ABNORMAL HIGH (ref 6–20)
CO2: 21 mmol/L — ABNORMAL LOW (ref 22–32)
Calcium: 10.1 mg/dL (ref 8.9–10.3)
Chloride: 110 mmol/L (ref 101–111)
Creatinine, Ser: 1.93 mg/dL — ABNORMAL HIGH (ref 0.44–1.00)
GFR calc Af Amer: 34 mL/min — ABNORMAL LOW (ref >60)
GFR calc non Af Amer: 29 mL/min — ABNORMAL LOW (ref >60)
Glucose, Bld: 184 mg/dL — ABNORMAL HIGH (ref 65–99)
Potassium: 3.6 mmol/L (ref 3.5–5.1)
Sodium: 142 mmol/L (ref 135–145)
Total Bilirubin: 0.8 mg/dL (ref 0.3–1.2)
Total Protein: 7.8 g/dL (ref 6.5–8.1)

## 2016-07-02 LAB — CBG MONITORING, ED: Glucose-Capillary: 198 mg/dL — ABNORMAL HIGH (ref 65–99)

## 2016-07-02 LAB — BLOOD GAS, VENOUS
Acid-base deficit: 3.9 mmol/L — ABNORMAL HIGH (ref 0.0–2.0)
Bicarbonate: 22.4 mEq/L (ref 20.0–24.0)
Drawn by: 161631
FIO2: 0.21
O2 Saturation: 67.1 %
Patient temperature: 98.6
TCO2: 20.3 mmol/L (ref 0–100)
pCO2, Ven: 47.3 mmHg (ref 45.0–50.0)
pH, Ven: 7.297 (ref 7.250–7.300)
pO2, Ven: 40.9 mmHg (ref 31.0–45.0)

## 2016-07-02 MED ORDER — LORAZEPAM 2 MG/ML IJ SOLN
1.0000 mg | Freq: Once | INTRAMUSCULAR | Status: AC
  Administered 2016-07-02: 1 mg via INTRAVENOUS
  Filled 2016-07-02: qty 1

## 2016-07-02 MED ORDER — SODIUM CHLORIDE 0.9 % IV BOLUS (SEPSIS)
2000.0000 mL | Freq: Once | INTRAVENOUS | Status: AC
  Administered 2016-07-02: 2000 mL via INTRAVENOUS

## 2016-07-02 MED ORDER — FENTANYL CITRATE (PF) 100 MCG/2ML IJ SOLN
100.0000 ug | Freq: Once | INTRAMUSCULAR | Status: AC
  Administered 2016-07-02: 100 ug via INTRAVENOUS
  Filled 2016-07-02: qty 2

## 2016-07-02 MED ORDER — METOCLOPRAMIDE HCL 5 MG/ML IJ SOLN
10.0000 mg | Freq: Once | INTRAMUSCULAR | Status: AC
  Administered 2016-07-02: 10 mg via INTRAVENOUS
  Filled 2016-07-02: qty 2

## 2016-07-02 MED ORDER — MORPHINE SULFATE (PF) 4 MG/ML IV SOLN
4.0000 mg | Freq: Once | INTRAVENOUS | Status: AC
  Administered 2016-07-02: 4 mg via INTRAVENOUS
  Filled 2016-07-02: qty 1

## 2016-07-02 MED ORDER — PROMETHAZINE HCL 25 MG/ML IJ SOLN
25.0000 mg | Freq: Once | INTRAMUSCULAR | Status: AC
  Administered 2016-07-02: 25 mg via INTRAVENOUS
  Filled 2016-07-02: qty 1

## 2016-07-02 MED ORDER — SODIUM CHLORIDE 0.9% FLUSH
10.0000 mL | INTRAVENOUS | Status: DC | PRN
Start: 2016-07-02 — End: 2016-07-04
  Administered 2016-07-04: 10 mL
  Filled 2016-07-02: qty 40

## 2016-07-02 NOTE — ED Provider Notes (Signed)
CSN: [id number]     Arrival date & time 07/02/16  1604 History   First MD Initiated Contact with Patient 07/02/16 1742     No chief complaint on file.    (Consider location/radiation/quality/duration/timing/severity/associated sxs/prior Treatment) HPI Abdominal Pain: Patient complains of abdominal pain. The pain is described as sharp, and is 6/10 in intensity. Pain is located in the diffusely without radiation. Onset was 1 day ago. Symptoms have been unchanged since. Aggravating factors: eating.  Alleviating factors: none. Associated symptoms: anorexia, nausea and vomiting. The patient denies arthralgias, belching, chills, constipation, diarrhea, dysuria, flatus, frequency, headache, hematochezia, hematuria, melena   Past Medical History  Diagnosis Date  . PANCREATITIS 05/09/2008  . Cyclic vomiting syndrome   . Hypertension   . Complication of anesthesia     "problems waking up"  . Heart murmur   . Myocardial infarction [facility]) 07/2007    "they say I've had a silent one; I don't know"  . Pneumonia   . Anemia   . Blood transfusion   . GERD (gastroesophageal reflux disease)   . Anxiety   . Depression   . Smoker   . Marijuana smoker, continuous ([place])   . NEUROPATHY, DIABETIC 02/23/2007  . Type II diabetes mellitus ([place])   . Diabetic gastroparesis ([place])     /e-chart  . Chronic kidney disease (CKD), stage III (moderate)   . Nausea & vomiting 06/10/2015  . Intestinal impaction ([place])   . Diabetic ketoacidosis without coma associated with type 2 diabetes mellitus [facility])    Past Surgical History  Procedure Laterality Date  . Port-a-cath placement  ~ 2008    right chest; "poor access; frequent hospitalizations"  . Laparotomy and lysis of adhesions    . Abdominal hysterectomy  02/2002  . Cholecystectomy  1980's  . Cardiac catheterization    . Dilation and evacuation  X3  . Left hand surgery    . Cataract extraction w/ intraocular lens  implant, bilateral    . Colonoscopy    . Upper gi  endoscopy    . Cesarean section     Family History  Problem Relation Age of Onset  . Diabetes type II Mother   . Diabetes Mother   . Hypertension Mother   . Diabetes type II Sister   . Diabetes Sister   . Hypertension Sister   . Hypertension Brother    Social History  Substance Use Topics  . Smoking status: Current Some Day Smoker -- 0.50 packs/day for 22 years    Types: Cigarettes    Start date: 12/27/1982  . Smokeless tobacco: Never Used     Comment: THC 1-2 times per day almost daily  . Alcohol Use: No   OB History    Gravida Para Term Preterm AB TAB SAB Ectopic Multiple Living   7    3 3    4      Review of Systems  Constitutional: Negative for fever, chills and diaphoresis.  HENT: Negative for congestion, rhinorrhea and sore throat.   Eyes: Negative for visual disturbance.  Respiratory: Negative for cough, choking, chest tightness, shortness of breath and stridor.   Cardiovascular: Negative for chest pain.  Gastrointestinal: Positive for nausea, vomiting and abdominal pain. Negative for diarrhea, constipation, blood in stool, abdominal distention, anal bleeding and rectal pain.  Endocrine: Negative for polydipsia and polyphagia.  Genitourinary: Negative for dysuria and flank pain.  Musculoskeletal: Negative for neck pain and neck stiffness.  Neurological: Negative for dizziness, speech difficulty, light-headedness and headaches.  Hematological: Negative for adenopathy.  Psychiatric/Behavioral: Negative for confusion.   All other systems negative except as documented in the HPI. All pertinent positives and negatives as reviewed in the HPI.   Allergies  Bee venom; Tegaderm ag mesh; Acetaminophen; Novolog; Ceftriaxone; and Erythromycin  Home Medications   Prior to Admission medications   Medication Sig Start Date End Date Taking? Authorizing Provider  ACCU-CHEK SOFTCLIX LANCETS lancets 1 each by Other route 4 (four) times daily. for testing 04/12/16   Olam Idler, MD  carvedilol (COREG) 25 MG tablet Take 0.5 tablets (12.5 mg total) by mouth 2 (two) times daily with a meal. 06/21/16   Zenia Resides, MD  diclofenac sodium (VOLTAREN) 1 % GEL Apply 2-4 g topically 4 (four) times daily. Apply over affected area up to seven days 03/23/16   Smiley Houseman, MD  glucose blood (ACCU-CHEK ACTIVE STRIPS) test strip Use as instructed 05/06/16   Alveda Reasons, MD  hydrALAZINE (APRESOLINE) 10 MG tablet Take 1 tablet (10 mg total) by mouth 3 (three) times daily. 06/21/16   Zenia Resides, MD  insulin glargine (LANTUS) 100 UNIT/ML injection Inject 0.05-0.2 mLs (5-20 Units total) into the skin daily. Patient taking differently: Inject 6 Units into the skin daily. Patient takes 5 units daily.  If blood glucose is > 150 mg/dL, patient takes 6 units daily 04/12/16   Olam Idler, MD  Insulin Syringe-Needle U-100 (BD INSULIN SYRINGE ULTRAFINE) 31G X 15/64" 0.3 ML MISC 1 Syringe by Does not apply route daily. 05/21/16   Olam Idler, MD  losartan (COZAAR) 100 MG tablet Take 1 tablet (100 mg total) by mouth daily. 06/21/16   Zenia Resides, MD  polyethylene glycol (MIRALAX / GLYCOLAX) packet Take 17 g by mouth daily as needed. May increase in 2-3 days to 2 times daily, Until stooling regularly Patient not taking: Reported on 04/22/2016 06/02/15   Hilton Sinclair, MD  pregabalin (LYRICA) 50 MG capsule TAKE 1 TABLET BY MOUTH AT BEDTIME AS NEEDED Patient not taking: Reported on 06/01/2016 04/05/16   Olam Idler, MD  promethazine (PHENERGAN) 12.5 MG suppository Place 1 suppository (12.5 mg total) rectally every 6 (six) hours as needed for nausea. 01/21/16   Sela Hua, MD  promethazine (PHENERGAN) 25 MG tablet Take 1 tablet (25 mg total) by mouth every 6 (six) hours as needed for nausea or vomiting. 12/04/15   Leone Brand, MD   BP 174/84 mmHg  Pulse 97  Resp 23  SpO2 100% Physical Exam  Constitutional: She is oriented to person, place, and time. She  appears well-developed and well-nourished. No distress.  HENT:  Head: Normocephalic and atraumatic.  Mouth/Throat: Oropharynx is clear and moist.  Eyes: Pupils are equal, round, and reactive to light.  Neck: Normal range of motion. Neck supple.  Cardiovascular: Normal rate, regular rhythm and normal heart sounds.  Exam reveals no gallop and no friction rub.   No murmur heard. Pulmonary/Chest: Effort normal and breath sounds normal. No respiratory distress. She has no wheezes.  Abdominal: Soft. Bowel sounds are normal. She exhibits no distension. There is tenderness. There is no rebound and no guarding.  Neurological: She is alert and oriented to person, place, and time. She exhibits normal muscle tone. Coordination normal.  Skin: Skin is warm and dry. No rash noted. No erythema.  Psychiatric: She has a normal mood and affect. Her behavior is normal.  Nursing note and vitals reviewed.   ED Course  Procedures (including critical care time) Labs Review Labs Reviewed  COMPREHENSIVE METABOLIC PANEL - Abnormal; Notable for the following:    CO2 21 (*)    Glucose, Bld 184 (*)    BUN 33 (*)    Creatinine, Ser 1.93 (*)    GFR calc non Af Amer 29 (*)    GFR calc Af Amer 34 (*)    All other components within normal limits  CBC WITH DIFFERENTIAL/PLATELET - Abnormal; Notable for the following:    WBC 11.1 (*)    RBC 3.75 (*)    Hemoglobin 11.7 (*)    HCT 32.6 (*)    All other components within normal limits  BLOOD GAS, VENOUS - Abnormal; Notable for the following:    Acid-base deficit 3.9 (*)    All other components within normal limits  CBG MONITORING, ED - Abnormal; Notable for the following:    Glucose-Capillary 198 (*)    All other components within normal limits  URINALYSIS, ROUTINE W REFLEX MICROSCOPIC (NOT AT [facility])    Imaging Review No results found. I have personally reviewed and evaluated these images and lab results as part of my medical decision-making.   EKG  Interpretation None      MDM   Final diagnoses:  None    Patient will be admitted for intractable vomiting. Spoke with FP Resident about admission.    Dalia Heading, PA-C 07/05/16 [place], MD 07/07/16 [phone]

## 2016-07-02 NOTE — ED Notes (Signed)
Per EMS pt woke up this morning with headache. Pt has hx of sicklic vomiting disorder. Pt has had 5 episodes of emesis today and has not been able to keep anything down. Also states she threw up her BP medication she took this am.

## 2016-07-02 NOTE — H&P (Signed)
[facility] Admission History and Physical Service Pager: [phone]  Patient name: [name] [name] Medical record number: [id number] Date of birth: 1966-06-27 Age: 50 y.o. Gender: female  Primary Care Provider: Evette Doffing, MD Consultants: None Code Status: FULL  Chief Complaint: vomiting  Assessment and Plan: [name] [name] is a 50 y.o. female presenting with vomiting. PMH is significant for cyclical vomiting disorder, substance abuse (THC), Type II DM with gastroparesis, HTN, HLD, CKD Stage III, and depression.   # Vomiting: Cyclical vomiting disorder with THC use vs diabetic gastroparesis. Patient not in DKA, with pH 7.3, bicarb WNL at 22, and anion gap WNL at 9.6, so gastroparesis less likely. Gastroenteritis possible, though less likely as patient is afebrile with only minimal leukocytosis (WBC 11), as well as no diarrhea. Current episode consistent with cyclical vomiting disorder flares on prior hospital admissions. Last used [facility] yesterday prior to onset of vomiting. Abdominal exam with mild tenderness in LLQ but benign otherwise. Obstruction or other acute intra-abdominal pathology less likely given physical exam findings and lab results.  - Place in observation, attending Dr. Erin Hearing - NPO - NS@125cc /hr - Phenergan 12.5 mg q6hr IV - IV Pepcid BID - Ativan 1mg  TID x24 hrs - Morphine 4 mg q4 PRN - F/u lipase - F/u UA to assess for ketones  # Type II DM: CBG 198 on admission. On Lantus 5 at home. Last A1C 8.1 in 02/2016. - CBG ACHS - Hold home Lantus - SSI - F/u A1C  # HTN: BP 153/89 on admission, however patient did not take BP meds today and is usually hypertensive during vomiting episodes. - Hold home Coreg, PO hydralazine while NPO - IV hydralazine q4 PRN SBP>190 or DBP>100  # HLD: - Hold home losartan while NPO  # CKD: Cr 1.9 on admission. Baseline is ~1.8.  - IVF as above - AM BMP  # Substance abuse: Endorses smoking THC twice  yesterday - F/u UDS  - SW consult  FEN/GI: NPO, NS@125cc /hr, IV Protonix Prophylaxis: Lovenox  Disposition: place in observation  History of Present Illness:  [name] [name] is a 50 y.o. female presenting with vomiting.  Patient presented to [facility] after reported twelve episodes of vomiting today. Has a history of multiple admissions for cyclical vomiting syndrome and vomiting associated with diabetic gastroparesis. Patient reports she has been unable to tolerate solids or liquids all day. She most recently vomited en route to [facility] from [place], after receiving Fentanyl, Ativan, Reglan, morphine, and Phenergan in [facility] ED.  She also endorses some abdominal pain earlier in the day that has since resolved. She denies constipation or diarrhea, fevers, chest pain. Reports missing some doses of Lantus last week, but has been taking her insulin daily otherwise. Endorses marijuana use; last smoked twice yesterday.   Review Of Systems: Per HPI with the following additions: none. Otherwise the remainder of the systems were negative.  Patient Active Problem List   Diagnosis Date Noted  . Vomiting 07/03/2016  . Intractable vomiting 07/02/2016  . Essential hypertension 05/01/2016  . Diabetic gastroparesis associated with type 2 diabetes mellitus ([place]) 05/01/2016  . Chest pain 04/30/2016  . Type 2 diabetes mellitus with hyperglycemia, without long-term current use of insulin ([place])   . Generalized abdominal pain   . Emotional stress 12/30/2015  . Hyperkalemia 11/28/2015  . Elevated cholesterol 11/28/2015  . AKI (acute kidney injury) ([place])   . Absolute anemia   . Hard palate ulcer   . Oral  ulcer 11/22/2015  . Tooth ache   . Hematuria 07/09/2015  . Constipation 06/06/2015  . Onychomycosis 03/25/2015  . Encounter for chronic pain management 01/06/2015  . Vitamin D deficiency 11/28/2014  . At risk for polypharmacy 08/15/2014  . Other and unspecified hyperlipidemia 08/15/2014  . Leg cramping  07/18/2014  . Rotator cuff syndrome 04/19/2014  . Biceps tendinopathy of right upper extremity 04/05/2014  . Protein-calorie malnutrition, severe ([place]) 12/17/2013  . Anemia 11/15/2013  . CKD (chronic kidney disease) 08/17/2013  . Esophagitis 04/18/2013  . Preventative health care 09/18/2012  . High risk social situation 08/31/2012  . Allergic rhinitis, seasonal 05/04/2012  . Marijuana smoker ([place]) 11/03/2011  . Uncontrolled secondary diabetes mellitus with stage 3 CKD (GFR 30-59) ([place]) 10/23/2011  . Cyclic vomiting syndrome 08/03/2011  . Hot flashes 07/29/2011  . Backache 04/10/2007  . Depression with anxiety 02/23/2007  . Anxiety state 02/23/2007  . TOBACCO DEPENDENCE 02/23/2007  . MIGRAINE, UNSPEC., W/O INTRACTABLE MIGRAINE 02/23/2007  . Essential hypertension, malignant 02/23/2007  . INSOMNIA NOS 02/23/2007  . DM neuropathies ([place]) 02/23/2007  . GERD 01/27/2004    Past Medical History: Past Medical History  Diagnosis Date  . PANCREATITIS 05/09/2008  . Cyclic vomiting syndrome   . Hypertension   . Complication of anesthesia     "problems waking up"  . Heart murmur   . Myocardial infarction [facility]) 07/2007    "they say I've had a silent one; I don't know"  . Pneumonia   . Anemia   . Blood transfusion   . GERD (gastroesophageal reflux disease)   . Anxiety   . Depression   . Smoker   . Marijuana smoker, continuous ([place])   . NEUROPATHY, DIABETIC 02/23/2007  . Type II diabetes mellitus ([place])   . Diabetic gastroparesis ([place])     /e-chart  . Chronic kidney disease (CKD), stage III (moderate)   . Nausea & vomiting 06/10/2015  . Intestinal impaction ([place])   . Diabetic ketoacidosis without coma associated with type 2 diabetes mellitus [facility])     Past Surgical History: Past Surgical History  Procedure Laterality Date  . Port-a-cath placement  ~ 2008    right chest; "poor access; frequent hospitalizations"  . Laparotomy and lysis of adhesions    . Abdominal hysterectomy   02/2002  . Cholecystectomy  1980's  . Cardiac catheterization    . Dilation and evacuation  X3  . Left hand surgery    . Cataract extraction w/ intraocular lens  implant, bilateral    . Colonoscopy    . Upper gi endoscopy    . Cesarean section      Social History: Social History  Substance Use Topics  . Smoking status: Current Some Day Smoker -- 0.50 packs/day for 22 years    Types: Cigarettes    Start date: 12/27/1982  . Smokeless tobacco: Never Used     Comment: THC 1-2 times per day almost daily  . Alcohol Use: No   Additional social history: last used THC twice yesterday Please also refer to relevant sections of EMR.  Family History: Family History  Problem Relation Age of Onset  . Diabetes type II Mother   . Diabetes Mother   . Hypertension Mother   . Diabetes type II Sister   . Diabetes Sister   . Hypertension Sister   . Hypertension Brother     Allergies and Medications: Allergies  Allergen Reactions  . Bee Venom Anaphylaxis    Required hospital visit to ER  .  Tegaderm Ag Mesh [Silver] Dermatitis    And Sorbaview tape- causes blisters and itching  . Acetaminophen Nausea And Vomiting  . Novolog [Insulin Aspart] Other (See Comments)    Muscles in feet cramp  . Ceftriaxone Other (See Comments)    Caused an ulcer in her mouth  . Erythromycin Nausea And Vomiting   Current Facility-Administered Medications on File Prior to Encounter  Medication Dose Route Frequency Provider Last Rate Last Dose  . 0.9 %  sodium chloride infusion   Intravenous Continuous Kinnie Feil, MD 20 mL/hr at 04/19/13 1500 500 mL at 04/19/13 1500   Current Outpatient Prescriptions on File Prior to Encounter  Medication Sig Dispense Refill  . carvedilol (COREG) 25 MG tablet Take 0.5 tablets (12.5 mg total) by mouth 2 (two) times daily with a meal. 60 tablet 0  . hydrALAZINE (APRESOLINE) 10 MG tablet Take 1 tablet (10 mg total) by mouth 3 (three) times daily. 30 tablet 0  . insulin  glargine (LANTUS) 100 UNIT/ML injection Inject 0.05-0.2 mLs (5-20 Units total) into the skin daily. (Patient taking differently: Inject 5-6 Units into the skin daily. Patient takes 5 units daily.  If blood glucose is > 150 mg/dL, patient takes 6 units daily) 10 mL 0  . losartan (COZAAR) 100 MG tablet Take 1 tablet (100 mg total) by mouth daily. 30 tablet 2  . ACCU-CHEK SOFTCLIX LANCETS lancets 1 each by Other route 4 (four) times daily. for testing 200 each 0  . diclofenac sodium (VOLTAREN) 1 % GEL Apply 2-4 g topically 4 (four) times daily. Apply over affected area up to seven days (Patient not taking: Reported on 07/02/2016) 100 g 0  . glucose blood (ACCU-CHEK ACTIVE STRIPS) test strip Use as instructed 100 each 12  . Insulin Syringe-Needle U-100 (BD INSULIN SYRINGE ULTRAFINE) 31G X 15/64" 0.3 ML MISC 1 Syringe by Does not apply route daily. 90 each 1  . polyethylene glycol (MIRALAX / GLYCOLAX) packet Take 17 g by mouth daily as needed. May increase in 2-3 days to 2 times daily, Until stooling regularly (Patient not taking: Reported on 04/22/2016) 30 packet prn  . pregabalin (LYRICA) 50 MG capsule TAKE 1 TABLET BY MOUTH AT BEDTIME AS NEEDED (Patient not taking: Reported on 06/01/2016) 30 capsule 1  . promethazine (PHENERGAN) 12.5 MG suppository Place 1 suppository (12.5 mg total) rectally every 6 (six) hours as needed for nausea. (Patient not taking: Reported on 07/02/2016) 12 each 0    Objective: BP 153/89 mmHg  Pulse 96  Temp(Src) 98.4 F (36.9 C) (Oral)  Resp 18  Ht 5\' 1"  (1.549 m)  Wt 144 lb 4.8 oz (65.454 kg)  BMI 27.28 kg/m2  SpO2 100% Exam: General: lying in bed, very somnolent, falling asleep frequently during encounter Eyes: PERRLA, EOMI ENTM: dry MM, no oropharyngeal erythema or exudates Cardiovascular: RRR, no murmurs appreciated Respiratory: poor effort, but no wheezes, rhonchi, rhales Abdomen: tender to palpation of LLQ, non-tender otherwise; soft, non-distended, hypoactive bowel  sounds, no masses or organomegaly, no rebound or guarding MSK: 5/5 strength upper and lower extremities bilaterally; no LE edema Skin: no rashes or bruises noted; Permacath present on R chest Neuro: somnolent but A&Ox3; no focal deficits Psych: appropriate mood and affect  Labs and Imaging: CBC BMET   Recent Labs Lab 07/02/16 1845  WBC 11.1*  HGB 11.7*  HCT 32.6*  PLT 221    Recent Labs Lab 07/02/16 1845  NA 142  K 3.6  CL 110  CO2 21*  BUN 33*  CREATININE 1.93*  GLUCOSE 184*  CALCIUM 10.1     No results found.   Verner Mould, MD 07/03/2016, 12:13 AM PGY-2, [place] Intern pager: [phone], text pages welcome

## 2016-07-03 ENCOUNTER — Encounter (HOSPITAL_COMMUNITY): Payer: Self-pay | Admitting: *Deleted

## 2016-07-03 DIAGNOSIS — G43A1 Cyclical vomiting, intractable: Secondary | ICD-10-CM

## 2016-07-03 DIAGNOSIS — R1110 Vomiting, unspecified: Secondary | ICD-10-CM | POA: Diagnosis present

## 2016-07-03 LAB — CBC
HEMATOCRIT: 29 % — AB (ref 36.0–46.0)
Hemoglobin: 9.7 g/dL — ABNORMAL LOW (ref 12.0–15.0)
MCH: 30.6 pg (ref 26.0–34.0)
MCHC: 33.4 g/dL (ref 30.0–36.0)
MCV: 91.5 fL (ref 78.0–100.0)
Platelets: 186 10*3/uL (ref 150–400)
RBC: 3.17 MIL/uL — AB (ref 3.87–5.11)
RDW: 14.2 % (ref 11.5–15.5)
WBC: 8.5 10*3/uL (ref 4.0–10.5)

## 2016-07-03 LAB — GLUCOSE, CAPILLARY
GLUCOSE-CAPILLARY: 81 mg/dL (ref 65–99)
GLUCOSE-CAPILLARY: 49 mg/dL — AB (ref 65–99)
GLUCOSE-CAPILLARY: 142 mg/dL — AB (ref 65–99)
Glucose-Capillary: 180 mg/dL — ABNORMAL HIGH (ref 65–99)
Glucose-Capillary: 105 mg/dL — ABNORMAL HIGH (ref 65–99)
Glucose-Capillary: 111 mg/dL — ABNORMAL HIGH (ref 65–99)
Glucose-Capillary: 175 mg/dL — ABNORMAL HIGH (ref 65–99)
Glucose-Capillary: 138 mg/dL — ABNORMAL HIGH (ref 65–99)

## 2016-07-03 LAB — URINALYSIS, ROUTINE W REFLEX MICROSCOPIC
Bilirubin Urine: NEGATIVE
Glucose, UA: 100 mg/dL — AB
Ketones, ur: NEGATIVE mg/dL
LEUKOCYTES UA: NEGATIVE
NITRITE: NEGATIVE
PROTEIN: 100 mg/dL — AB
SPECIFIC GRAVITY, URINE: 1.016 (ref 1.005–1.030)
pH: 5.5 (ref 5.0–8.0)

## 2016-07-03 LAB — RAPID URINE DRUG SCREEN, HOSP PERFORMED
AMPHETAMINES: NOT DETECTED
Barbiturates: NOT DETECTED
Benzodiazepines: NOT DETECTED
COCAINE: NOT DETECTED
OPIATES: POSITIVE — AB
TETRAHYDROCANNABINOL: POSITIVE — AB

## 2016-07-03 LAB — BASIC METABOLIC PANEL
Anion gap: 4 — ABNORMAL LOW (ref 5–15)
BUN: 27 mg/dL — AB (ref 6–20)
CHLORIDE: 116 mmol/L — AB (ref 101–111)
CO2: 24 mmol/L (ref 22–32)
CREATININE: 1.68 mg/dL — AB (ref 0.44–1.00)
Calcium: 9.1 mg/dL (ref 8.9–10.3)
GFR calc Af Amer: 40 mL/min — ABNORMAL LOW (ref >60)
GFR calc non Af Amer: 34 mL/min — ABNORMAL LOW (ref >60)
Glucose, Bld: 111 mg/dL — ABNORMAL HIGH (ref 65–99)
POTASSIUM: 3.8 mmol/L (ref 3.5–5.1)
SODIUM: 144 mmol/L (ref 135–145)

## 2016-07-03 LAB — URINE MICROSCOPIC-ADD ON: WBC UA: NONE SEEN WBC/hpf (ref 0–5)

## 2016-07-03 LAB — LIPASE, BLOOD: Lipase: 47 U/L (ref 11–51)

## 2016-07-03 MED ORDER — FAMOTIDINE IN NACL 20-0.9 MG/50ML-% IV SOLN
20.0000 mg | Freq: Two times a day (BID) | INTRAVENOUS | Status: DC
  Administered 2016-07-03 – 2016-07-04 (×4): 20 mg via INTRAVENOUS
  Filled 2016-07-03 (×8): qty 50

## 2016-07-03 MED ORDER — DEXTROSE 50 % IV SOLN
INTRAVENOUS | Status: AC
  Administered 2016-07-03: 12.5 g via INTRAVENOUS
  Filled 2016-07-03: qty 50

## 2016-07-03 MED ORDER — ENOXAPARIN SODIUM 30 MG/0.3ML ~~LOC~~ SOLN
30.0000 mg | Freq: Every day | SUBCUTANEOUS | Status: DC
  Administered 2016-07-03 – 2016-07-04 (×2): 30 mg via SUBCUTANEOUS
  Filled 2016-07-03 (×2): qty 0.3

## 2016-07-03 MED ORDER — INSULIN ASPART 100 UNIT/ML ~~LOC~~ SOLN: 0.0000 [IU] | Freq: Every day | SUBCUTANEOUS | Status: DC

## 2016-07-03 MED ORDER — INSULIN ASPART 100 UNIT/ML ~~LOC~~ SOLN
0.0000 [IU] | Freq: Three times a day (TID) | SUBCUTANEOUS | Status: DC
  Administered 2016-07-03: 1 [IU] via SUBCUTANEOUS
  Administered 2016-07-04: 2 [IU] via SUBCUTANEOUS

## 2016-07-03 MED ORDER — HYDRALAZINE HCL 20 MG/ML IJ SOLN: 5.0000 mg | INTRAMUSCULAR | Status: DC | PRN

## 2016-07-03 MED ORDER — PROMETHAZINE HCL 25 MG/ML IJ SOLN
12.5000 mg | Freq: Four times a day (QID) | INTRAMUSCULAR | Status: DC | PRN
  Administered 2016-07-03 – 2016-07-04 (×2): 25 mg via INTRAVENOUS
  Filled 2016-07-03 (×2): qty 1

## 2016-07-03 MED ORDER — MORPHINE SULFATE (PF) 4 MG/ML IV SOLN
4.0000 mg | INTRAVENOUS | Status: DC | PRN
  Administered 2016-07-03 – 2016-07-04 (×5): 4 mg via INTRAVENOUS
  Filled 2016-07-03 (×5): qty 1

## 2016-07-03 MED ORDER — SODIUM CHLORIDE 0.9 % IV SOLN
INTRAVENOUS | Status: DC
  Administered 2016-07-03 (×2): via INTRAVENOUS

## 2016-07-03 MED ORDER — LORAZEPAM 2 MG/ML IJ SOLN
1.0000 mg | Freq: Three times a day (TID) | INTRAMUSCULAR | Status: AC
  Administered 2016-07-03 (×3): 1 mg via INTRAVENOUS
  Filled 2016-07-03 (×3): qty 1

## 2016-07-03 NOTE — Plan of Care (Signed)
Problem: Food- and Nutrition-Related Knowledge Deficit (NB-1.1) Goal: Nutrition education Formal process to instruct or train a patient/client in a skill or to impart knowledge to help patients/clients voluntarily manage or modify food choices and eating behavior to maintain or improve health. Outcome: Progressing  RD drawn to chart due to malnutrition screening tool.  Patient endorses recent weight loss, however with high variability in weight and appears to be within her usual weight range.  She has been admitted with recurrence of cyclical vomiting.  RD met with patient who requests education on diet changes she can make in order to improve her symptoms.      Lab Results  Component Value Date    HGBA1C 8.1* 03/21/2016    RD provided "Gastroparesis Nutrition Therapy" handout from the Academy of [employer]etics. Discussed different food groups and their effects gut motility. Provided sample menu.  All questions answered.  Expect good compliance.  Body mass index is 27.28 kg/(m^2). Pt meets criteria for overweight based on current BMI.  Current diet order is clear liquids, patient is consuming approximately 75% of meals at this time- eating during visit. Labs and medications reviewed. No further nutrition interventions warranted at this time. RD contact information provided. If additional nutrition issues arise, please re-consult RD.  Will continue to monitor for diet advancement.   Weekend RD coverage:  Brynda Greathouse, MS RD LDN Weekend/After hours pager: [phone]

## 2016-07-03 NOTE — Progress Notes (Signed)
Paged Family medicine [phone]

## 2016-07-03 NOTE — Progress Notes (Signed)
New Admission Note:   Arrival Method: Via stretcher from [place] from [facility] ED Mental Orientation:  A & O x 4 - somnolent, falls asleep frequently, become irritable when awakened Telemetry: Placed on Tele # [id number] Assessment: Completed Skin:  Dry but intact where this RN could view.  Patient stated she did not have any wounds to bottom IV:  Rt chest port - a - cath accessed Pain: c/o mid lower abdominal pain - rating as 8/10 - but falls asleep in middle of questions. Tubes:  none Safety Measures: Safety Fall Prevention Plan has been given, discussed and signed Admission: Completed 6 [place] Orientation: Patient has been orientated to the room, unit and staff.  Family:  None at bedside  Unable to complete entire admission secondary to patient falling asleep frequently during initial assessment.  Patient became more irritable when one continued to wake her up.  She has a bag of belongings at her bedside but did not want to discuss at this time.  Not sure if anything of value is with her at this time.  Her gait is unsteady at this time and due to the sedating medications, she has been made a high fall risk.  Orders have been reviewed and implemented. Will continue to monitor the patient. Call light has been placed within reach and bed alarm has been activated.   Earleen Reaper RN- [place], [place] Phone number: [phone]

## 2016-07-03 NOTE — Progress Notes (Signed)
Pt CBG 49, alert and oriented, drowsy. Skin warm and dry.  Gave 12.5 grams D50 IV will recheck.

## 2016-07-03 NOTE — Progress Notes (Signed)
Pt CBG 111, spoke with family medicine.  Hold off on the other 12.5 g D50 for now.  Pt can have clear liquids if tolerated.

## 2016-07-04 DIAGNOSIS — G43A1 Cyclical vomiting, intractable: Secondary | ICD-10-CM | POA: Diagnosis not present

## 2016-07-04 LAB — BASIC METABOLIC PANEL
Anion gap: 3 — ABNORMAL LOW (ref 5–15)
BUN: 17 mg/dL (ref 6–20)
CALCIUM: 8.3 mg/dL — AB (ref 8.9–10.3)
CHLORIDE: 114 mmol/L — AB (ref 101–111)
CO2: 24 mmol/L (ref 22–32)
CREATININE: 1.57 mg/dL — AB (ref 0.44–1.00)
GFR calc Af Amer: 43 mL/min — ABNORMAL LOW (ref >60)
GFR, EST NON AFRICAN AMERICAN: 37 mL/min — AB (ref >60)
Glucose, Bld: 57 mg/dL — ABNORMAL LOW (ref 65–99)
POTASSIUM: 3.3 mmol/L — AB (ref 3.5–5.1)
SODIUM: 141 mmol/L (ref 135–145)

## 2016-07-04 LAB — CBC
HEMATOCRIT: 25.5 % — AB (ref 36.0–46.0)
Hemoglobin: 8.5 g/dL — ABNORMAL LOW (ref 12.0–15.0)
MCH: 30.8 pg (ref 26.0–34.0)
MCHC: 33.3 g/dL (ref 30.0–36.0)
MCV: 92.4 fL (ref 78.0–100.0)
PLATELETS: 160 10*3/uL (ref 150–400)
RBC: 2.76 MIL/uL — ABNORMAL LOW (ref 3.87–5.11)
RDW: 14.4 % (ref 11.5–15.5)
WBC: 4.6 10*3/uL (ref 4.0–10.5)

## 2016-07-04 LAB — GLUCOSE, CAPILLARY
GLUCOSE-CAPILLARY: 65 mg/dL (ref 65–99)
GLUCOSE-CAPILLARY: 153 mg/dL — AB (ref 65–99)
Glucose-Capillary: 190 mg/dL — ABNORMAL HIGH (ref 65–99)
Glucose-Capillary: 110 mg/dL — ABNORMAL HIGH (ref 65–99)

## 2016-07-04 LAB — MAGNESIUM: MAGNESIUM: 1.5 mg/dL — AB (ref 1.7–2.4)

## 2016-07-04 MED ORDER — POTASSIUM CHLORIDE CRYS ER 20 MEQ PO TBCR
40.0000 meq | EXTENDED_RELEASE_TABLET | Freq: Once | ORAL | Status: AC
  Administered 2016-07-04: 40 meq via ORAL
  Filled 2016-07-04: qty 2

## 2016-07-04 MED ORDER — FAMOTIDINE 20 MG PO TABS
10.0000 mg | ORAL_TABLET | Freq: Every day | ORAL | Status: DC
  Administered 2016-07-04: 10 mg via ORAL
  Filled 2016-07-04: qty 1

## 2016-07-04 MED ORDER — MORPHINE SULFATE (PF) 4 MG/ML IV SOLN: 4.0000 mg | Freq: Four times a day (QID) | INTRAVENOUS | Status: DC | PRN

## 2016-07-04 MED ORDER — CARVEDILOL 12.5 MG PO TABS
12.5000 mg | ORAL_TABLET | Freq: Two times a day (BID) | ORAL | Status: DC
  Administered 2016-07-04 (×2): 12.5 mg via ORAL
  Filled 2016-07-04 (×2): qty 1

## 2016-07-04 MED ORDER — ENOXAPARIN SODIUM 40 MG/0.4ML ~~LOC~~ SOLN: 40.0000 mg | Freq: Every day | SUBCUTANEOUS | Status: DC

## 2016-07-04 MED ORDER — LORAZEPAM 0.5 MG PO TABS
0.5000 mg | ORAL_TABLET | Freq: Once | ORAL | Status: AC
  Administered 2016-07-04: 0.5 mg via ORAL
  Filled 2016-07-04: qty 1

## 2016-07-04 MED ORDER — HYDRALAZINE HCL 10 MG PO TABS
10.0000 mg | ORAL_TABLET | Freq: Three times a day (TID) | ORAL | Status: DC
  Administered 2016-07-04 (×2): 10 mg via ORAL
  Filled 2016-07-04 (×2): qty 1

## 2016-07-04 MED ORDER — LOSARTAN POTASSIUM 50 MG PO TABS
100.0000 mg | ORAL_TABLET | Freq: Every day | ORAL | Status: DC
  Administered 2016-07-04: 100 mg via ORAL
  Filled 2016-07-04: qty 2

## 2016-07-04 MED ORDER — HEPARIN SOD (PORK) LOCK FLUSH 100 UNIT/ML IV SOLN
500.0000 [IU] | INTRAVENOUS | Status: AC | PRN
  Administered 2016-07-04: 500 [IU]

## 2016-07-04 NOTE — Progress Notes (Signed)
Family Medicine Teaching Service Daily Progress Note Intern Pager: [phone]  Patient name: [name] [name] Medical record number: [id number] Date of birth: 1966-02-27 Age: 50 y.o. Gender: female  Primary Care Provider: Evette Doffing, MD Consultants: none Code Status: FULL  Pt Overview and Major Events to Date:  7/8 admitted  Assessment and Plan: [name] [name] is a 50 y.o. female presenting with vomiting. PMH is significant for cyclical vomiting disorder, substance abuse (THC), Type II DM with gastroparesis, HTN, HLD, CKD Stage III, and depression.   # Vomiting: Cyclical vomiting disorder with THC use vs diabetic gastroparesis. UDS +THC.  Tolerated clears overnight.  No episodes of vomiting.  Stooling normally.  Lipase 47.  UA with no ketones. - Advance to fulls for lunch - SLIV - Phenergan 12.5 mg q6hr IV - pepcid changed to oral - Ativan 0.5mg  PO x1. - Morphine discontinued  # Hypokalemia: K 3.3.  Likely from decreased PO intake/ vomiting. - KDur 40 meq x1 - repeat BMP in am - check mag  # Type II DM: CBG 65 this am. On Lantus 5 at home. Last A1C 8.1 in 02/2016. - CBG ACHS - Hold home Lantus - SSI - F/u A1C  # HTN: BP 181/68 this am - Home Coreg, PO hydralazine restarted - IV hydralazine q4 PRN SBP>190 or DBP>100  # HLD: - Hold home losartan while NPO  # CKD: Cr 1.57.  Baseline is ~1.8.  - AM BMP  # Substance abuse: UDS +THC - SW consult  FEN/GI: Clears advance to fulls this afternoon, SLIV, Pepcid Prophylaxis: Lovenox sub-q  Disposition: Discharge pending clinical improvement.  Suspect can go home tonight vs tomorrow.  Subjective:  Patient notes that she is doing ok.  She is having her typical abdominal pain that she associates with these episodes.  She notes that she has not vomited overnight.  She is tolerating clears well.  No diarrhea.  Last BM this am.  Objective: Temp:  [98.2 F (36.8 C)-98.6 F (37 C)] 98.4 F (36.9 C) (07/09 0849) Pulse Rate:   [51-73] 58 (07/09 0849) Resp:  [16-18] 16 (07/09 0849) BP: (151-181)/(59-78) 181/68 mmHg (07/09 0849) SpO2:  [100 %] 100 % (07/09 0849) Weight:  [150 lb 6.4 oz (68.221 kg)] 150 lb 6.4 oz (68.221 kg) (07/08 2009) Physical Exam: General: awake, alert, NAD, sitting up in bed Cardiovascular: RRR, no murmurs Respiratory: CTAB, normal WOB on room air Abdomen: soft, +TTP to epigastrium, no peritoneal signs, +BS Extremities: WWP, no edema  Laboratory:  Recent Labs Lab 07/02/16 1845 07/03/16 0515 07/04/16 0455  WBC 11.1* 8.5 4.6  HGB 11.7* 9.7* 8.5*  HCT 32.6* 29.0* 25.5*  PLT 221 186 160    Recent Labs Lab 07/02/16 1845 07/03/16 0515 07/04/16 0455  NA 142 144 141  K 3.6 3.8 3.3*  CL 110 116* 114*  CO2 21* 24 24  BUN 33* 27* 17  CREATININE 1.93* 1.68* 1.57*  CALCIUM 10.1 9.1 8.3*  PROT 7.8  --   --   BILITOT 0.8  --   --   ALKPHOS 78  --   --   ALT 14  --   --   AST 20  --   --   GLUCOSE 184* 111* 57*    Lipase     Component Value Date/Time   LIPASE 47 07/03/2016 0058    Imaging/Diagnostic Tests: none  Janora Norlander, DO 07/04/2016, 9:46 AM PGY-3, [place] Intern pager: [phone], text pages  welcome

## 2016-07-04 NOTE — Discharge Summary (Signed)
[facility] Discharge Summary  Patient name: [name] [name] Medical record number: [id number] Date of birth: 11-Jul-1966 Age: 50 y.o. Gender: female Date of Admission: 07/02/2016  Date of Discharge: 07/04/16 Admitting Physician: Lind Covert, MD  Primary Care Provider: Evette Doffing, MD Consultants: None  Indication for Hospitalization: Vomiting  Discharge Diagnoses/Problem List:  Intractable Vomiting Type II diabetes Hypertension Hyperlipidemia CKD Substance Abuse  Disposition: Home  Discharge Condition: Stable  Discharge Exam: Please Refer to Progress Note from 07/04/16  Brief Hospital Course:  Mrs. Kollmann is a 50yo female who presented to the [place] ED on 07/03/16 with 12 episodes of vomiting in the last 24hr. Was unable to tolerate solids and liquids. Endorsed marijuana use x2 in last 24hr. Was given Fentanyl, Ativan, Reglan, Morphine, and Phenergan in the ED. Transferred to [name] for observation by the [facility] Medicine Teaching Service.  Treated for Cyclical Vomiting during hospitalization. Initially made NPO and placed on NS@125cc /hr. Phenergan, Pepcid, Ativan, and Morphine used throughout hospitalization. Nausea and Vomiting improved throughout hospitalization. Diet advanced as tolerated and medications above weaned. Discharged on 07/04/16 following resolution of symptoms.  Issues for Follow Up:  1. Follow up A1C 2. Follow up vomiting. Consider checking BMP with Magnesium 3. Continue to counsel against THC use  Significant Procedures: None  Significant Labs and Imaging:   Recent Labs Lab 07/02/16 1845 07/03/16 0515 07/04/16 0455  WBC 11.1* 8.5 4.6  HGB 11.7* 9.7* 8.5*  HCT 32.6* 29.0* 25.5*  PLT 221 186 160    Recent Labs Lab 07/02/16 1845 07/03/16 0515 07/04/16 0455 07/04/16 0938  NA 142 144 141  --   K 3.6 3.8 3.3*  --   CL 110 116* 114*  --   CO2 21* 24 24  --   GLUCOSE 184* 111* 57*  --   BUN 33* 27* 17  --    CREATININE 1.93* 1.68* 1.57*  --   CALCIUM 10.1 9.1 8.3*  --   MG  --   --   --  1.5*  ALKPHOS 78  --   --   --   AST 20  --   --   --   ALT 14  --   --   --   ALBUMIN 4.7  --   --   --    Results/Tests Pending at Time of Discharge: A1C  Discharge Medications:    Medication List    TAKE these medications        ACCU-CHEK SOFTCLIX LANCETS lancets  1 each by Other route 4 (four) times daily. for testing     carvedilol 25 MG tablet  Commonly known as:  COREG  Take 0.5 tablets (12.5 mg total) by mouth 2 (two) times daily with a meal.     diclofenac sodium 1 % Gel  Commonly known as:  VOLTAREN  Apply 2-4 g topically 4 (four) times daily. Apply over affected area up to seven days     glucose blood test strip  Commonly known as:  ACCU-CHEK ACTIVE STRIPS  Use as instructed     hydrALAZINE 10 MG tablet  Commonly known as:  APRESOLINE  Take 1 tablet (10 mg total) by mouth 3 (three) times daily.     insulin glargine 100 UNIT/ML injection  Commonly known as:  LANTUS  Inject 0.05-0.2 mLs (5-20 Units total) into the skin daily.     Insulin Syringe-Needle U-100 31G X 15/64" 0.3 ML Misc  Commonly known as:  BD INSULIN SYRINGE ULTRAFINE  1 Syringe by Does not apply route daily.     losartan 100 MG tablet  Commonly known as:  COZAAR  Take 1 tablet (100 mg total) by mouth daily.     polyethylene glycol packet  Commonly known as:  MIRALAX / GLYCOLAX  Take 17 g by mouth daily as needed. May increase in 2-3 days to 2 times daily, Until stooling regularly     pregabalin 50 MG capsule  Commonly known as:  LYRICA  TAKE 1 TABLET BY MOUTH AT BEDTIME AS NEEDED     promethazine 12.5 MG suppository  Commonly known as:  PHENERGAN  Place 1 suppository (12.5 mg total) rectally every 6 (six) hours as needed for nausea.        Discharge Instructions: Please refer to Patient Instructions section of EMR for full details.  Patient was counseled important signs and symptoms that should  prompt return to medical care, changes in medications, dietary instructions, activity restrictions, and follow up appointments.   Follow-Up Appointments:     Follow-up Information    Follow up with Evette Doffing, MD. Go on 07/15/2016.   Specialty:  Family Medicine   Why:  11:30 am   Contact information:   [place] [address] [phone]       Lorna Few, [place] 07/04/2016, 7:17 PM PGY-3, [place]

## 2016-07-04 NOTE — Progress Notes (Signed)
[name] [name] to be D/C'd Home per MD order.  Discussed prescriptions and follow up appointments with the patient. Prescriptions given to patient, medication list explained in detail. Pt verbalized understanding.    Medication List    TAKE these medications        ACCU-CHEK SOFTCLIX LANCETS lancets  1 each by Other route 4 (four) times daily. for testing     carvedilol 25 MG tablet  Commonly known as:  COREG  Take 0.5 tablets (12.5 mg total) by mouth 2 (two) times daily with a meal.     diclofenac sodium 1 % Gel  Commonly known as:  VOLTAREN  Apply 2-4 g topically 4 (four) times daily. Apply over affected area up to seven days     glucose blood test strip  Commonly known as:  ACCU-CHEK ACTIVE STRIPS  Use as instructed     hydrALAZINE 10 MG tablet  Commonly known as:  APRESOLINE  Take 1 tablet (10 mg total) by mouth 3 (three) times daily.     insulin glargine 100 UNIT/ML injection  Commonly known as:  LANTUS  Inject 0.05-0.2 mLs (5-20 Units total) into the skin daily.     Insulin Syringe-Needle U-100 31G X 15/64" 0.3 ML Misc  Commonly known as:  BD INSULIN SYRINGE ULTRAFINE  1 Syringe by Does not apply route daily.     losartan 100 MG tablet  Commonly known as:  COZAAR  Take 1 tablet (100 mg total) by mouth daily.     polyethylene glycol packet  Commonly known as:  MIRALAX / GLYCOLAX  Take 17 g by mouth daily as needed. May increase in 2-3 days to 2 times daily, Until stooling regularly     pregabalin 50 MG capsule  Commonly known as:  LYRICA  TAKE 1 TABLET BY MOUTH AT BEDTIME AS NEEDED     promethazine 12.5 MG suppository  Commonly known as:  PHENERGAN  Place 1 suppository (12.5 mg total) rectally every 6 (six) hours as needed for nausea.        Filed Vitals:   07/04/16 0849 07/04/16 1808  BP: 181/68 177/117  Pulse: 58 53  Temp: 98.4 F (36.9 C) 98 F (36.7 C)  Resp: 16 20    Skin clean, dry and intact without evidence of skin break down, no evidence  of skin tears noted. IV catheter discontinued intact. Site without signs and symptoms of complications. Dressing and pressure applied. Pt denies pain at this time. No complaints noted.  An After Visit Summary was printed and given to the patient. Patient ambulate off unit, and D/C home via private auto.  Retta Mac BSN, RN

## 2016-07-04 NOTE — Discharge Instructions (Signed)
You were admitted for intractable vomiting.  Please follow up with your PCP as scheduled.  Please STOP smoking marijuana, as this is likely contributing to your cyclical vomiting.

## 2016-07-05 LAB — HEMOGLOBIN A1C
Hgb A1c MFr Bld: 8.1 % — ABNORMAL HIGH (ref 4.8–5.6)
Mean Plasma Glucose: 186 mg/dL

## 2016-07-08 LAB — HM DIABETES EYE EXAM

## 2016-07-15 ENCOUNTER — Ambulatory Visit (INDEPENDENT_AMBULATORY_CARE_PROVIDER_SITE_OTHER): Payer: Medicare Other | Admitting: Family Medicine

## 2016-07-15 ENCOUNTER — Encounter: Payer: Self-pay | Admitting: Family Medicine

## 2016-07-15 VITALS — BP 124/56 | HR 59 | Temp 98.2°F | Ht 61.0 in | Wt 145.0 lb

## 2016-07-15 DIAGNOSIS — R319 Hematuria, unspecified: Secondary | ICD-10-CM

## 2016-07-15 DIAGNOSIS — N184 Chronic kidney disease, stage 4 (severe): Secondary | ICD-10-CM

## 2016-07-15 DIAGNOSIS — F1220 Cannabis dependence, uncomplicated: Secondary | ICD-10-CM

## 2016-07-15 DIAGNOSIS — K30 Functional dyspepsia: Secondary | ICD-10-CM

## 2016-07-15 DIAGNOSIS — E559 Vitamin D deficiency, unspecified: Secondary | ICD-10-CM

## 2016-07-15 DIAGNOSIS — E1149 Type 2 diabetes mellitus with other diabetic neurological complication: Secondary | ICD-10-CM | POA: Diagnosis not present

## 2016-07-15 DIAGNOSIS — IMO0002 Reserved for concepts with insufficient information to code with codable children: Secondary | ICD-10-CM

## 2016-07-15 DIAGNOSIS — K5900 Constipation, unspecified: Secondary | ICD-10-CM

## 2016-07-15 DIAGNOSIS — D649 Anemia, unspecified: Secondary | ICD-10-CM

## 2016-07-15 DIAGNOSIS — G43A1 Cyclical vomiting, intractable: Secondary | ICD-10-CM

## 2016-07-15 DIAGNOSIS — R1115 Cyclical vomiting syndrome unrelated to migraine: Secondary | ICD-10-CM

## 2016-07-15 DIAGNOSIS — Z7189 Other specified counseling: Secondary | ICD-10-CM

## 2016-07-15 DIAGNOSIS — K3184 Gastroparesis: Secondary | ICD-10-CM

## 2016-07-15 DIAGNOSIS — G8929 Other chronic pain: Secondary | ICD-10-CM

## 2016-07-15 DIAGNOSIS — E1322 Other specified diabetes mellitus with diabetic chronic kidney disease: Secondary | ICD-10-CM

## 2016-07-15 DIAGNOSIS — I10 Essential (primary) hypertension: Secondary | ICD-10-CM

## 2016-07-15 DIAGNOSIS — F1290 Cannabis use, unspecified, uncomplicated: Secondary | ICD-10-CM

## 2016-07-15 DIAGNOSIS — E1365 Other specified diabetes mellitus with hyperglycemia: Secondary | ICD-10-CM

## 2016-07-15 DIAGNOSIS — E7800 Pure hypercholesterolemia, unspecified: Secondary | ICD-10-CM

## 2016-07-15 DIAGNOSIS — K5909 Other constipation: Secondary | ICD-10-CM

## 2016-07-15 DIAGNOSIS — E1143 Type 2 diabetes mellitus with diabetic autonomic (poly)neuropathy: Secondary | ICD-10-CM

## 2016-07-15 DIAGNOSIS — N183 Chronic kidney disease, stage 3 (moderate): Secondary | ICD-10-CM

## 2016-07-15 MED ORDER — POLYETHYLENE GLYCOL 3350 17 G PO PACK: 17.0000 g | PACK | Freq: Every day | ORAL | Status: DC | PRN

## 2016-07-15 MED ORDER — LORAZEPAM 0.5 MG PO TABS: 0.5000 mg | ORAL_TABLET | Freq: Two times a day (BID) | ORAL | Status: DC | PRN

## 2016-07-15 MED ORDER — PREGABALIN 50 MG PO CAPS: ORAL_CAPSULE | ORAL | Status: DC

## 2016-07-15 MED ORDER — PROMETHAZINE HCL 12.5 MG RE SUPP: 12.5000 mg | Freq: Four times a day (QID) | RECTAL | Status: DC | PRN

## 2016-07-15 MED ORDER — OXYCODONE HCL 5 MG PO TABS: 5.0000 mg | ORAL_TABLET | ORAL | Status: DC | PRN

## 2016-07-15 MED ORDER — PROMETHAZINE HCL 12.5 MG PO TABS: 12.5000 mg | ORAL_TABLET | Freq: Four times a day (QID) | ORAL | Status: DC | PRN

## 2016-07-15 NOTE — Patient Instructions (Addendum)
Pick up Magnesium supplements at the Pharmacy.  They are over the counter.   Use the oxycodone and ativan if you have a flare of your gastroparesis.  Start as soon as you can, if you can.

## 2016-07-16 ENCOUNTER — Encounter: Payer: Self-pay | Admitting: Family Medicine

## 2016-07-16 NOTE — Assessment & Plan Note (Signed)
Established problem. Stable. Continue current therapy of MIralax.  May need laxative cathartic at times

## 2016-07-16 NOTE — Assessment & Plan Note (Signed)
Will need to discuss statin therapy on next visit for patient with diabetes

## 2016-07-16 NOTE — Assessment & Plan Note (Signed)
Patient not taking Vitamin D Supplement Will remind patient of this on next office visit

## 2016-07-16 NOTE — Assessment & Plan Note (Addendum)
Two episodes in last year of urinalysis with 6-30 rbc/hpf, 01/17 and 05/17.  S/P Hysterectomy Need to check urine next visit. Likely needs urology referral for cystoscopy arranged next ov in 3 to 4 months.    Abdominal Pelvic CT 06/09/15 showed, "Bladder wall thickening question due to infection, infiltrative process or incomplete distention.  Persistent mild RIGHT hydronephrosis and proximal RIGHT ureteral dilatation with BILATERAL perinephric stranding ; these changes  could be related to urinary tract infection, and correlation with urinalysis is recommended.   Abdominal Pelvic CT 01/2015 showed, "Bilateral perinephric stranding, right worse than left with mild right hydroureteronephrosis. Small amount of right perinephric fluid. These findings can be seen in the setting of pyelonephritis. Correlate with urinalysis.  Mild bladder wall thickening which may be secondary to underdistention versus cystitis."

## 2016-07-16 NOTE — Assessment & Plan Note (Signed)
Established problem. Stable. I have known [name] [name] for over 10 years, and manged many of her cyclic vomiting exacerbations in past with cocktail of scheduled Ativan/Opiates/Phenergan with prettygood success. I want to patient to be able to initiate this same therapy at home as soon as she experiences her warning stereotypical warning signs and symptoms with the goal of trying to terminate the exacerations at home without having to come into ED for hospital admission. To that end, I prescribed [name] [name] a limited supply of Ativan/percocet/phenergan(supp and tabs) that she is to take at first sign of an attack.   When [name] [name] returns from [facility] visit, will revisit the abortive therapy to access efficacy and tolerability.

## 2016-07-16 NOTE — Assessment & Plan Note (Signed)
Established problem. Stable.   

## 2016-07-16 NOTE — Assessment & Plan Note (Signed)
Adequate blood pressure control.  No evidence of new end organ damage.  Tolerating medication without significant adverse effects.  Plan to continue current blood pressure regiment.   

## 2016-07-16 NOTE — Progress Notes (Signed)
Subjective:    Patient ID: [name] [name], female    DOB: [birth year], 50 y.o.   MRN: [id number]  HPI Problem List Items Addressed This Visit      Cardiovascular and Mediastinum   Essential hypertension CHRONIC HYPERTENSION  Disease Monitoring  Blood pressure range: not checking at home  Chest pain: no   Dyspnea: no   Claudication: no   Medication compliance: yes  Medication Side Effects  Lightheadedness: no   Urinary frequency: no   Edema: no        Digestive   Intermittent constipation -longstanding issue   Diabetic gastroparesis associated with type 2 diabetes mellitus ([place] -longstanding issue   Delayed gastric emptying  -longstanding issue   Cyclic vomiting syndrome -longstanding issue     Endocrine   Uncontrolled secondary diabetes mellitus with stage 3 CKD (GFR 30-59) ([place]) CHRONIC DIABETES  Disease Monitoring  Blood Sugar Ranges: in 100s to mid 200s              Polyuria: no   Visual problems: no   Medication Compliance: yes  Medication Side Effects  Hypoglycemia: yes, self rescue   Preventitive Health Care             Daily Aspirin: no              Statin: no             Dental evaluation in 30-month: in process of having teeth removed and fitting for dentures             Recent eGFR: 43 ml/min 07/04/16             ACEI: ARB(+)  Eye Exam: unknown  Foot Exam: unknown  Diet pattern: high CH2O   Exercise: no        DM neuropathies (HCC) -longstanding issue - Stable   Relevant Medications   pregabalin (LYRICA) 50 MG capsule     Genitourinary   Chronic kidney disease (CKD), stage IV (severe) [place] -[place] issue - has seen Nephrology in past.       Other   Vitamin D deficiency -longstanding issue - no fragility fxs    Patient not taking Vitamin D Supplement Will remind patient of this on next office visit      Pure hypercholesterolemia -longstanding issue - stable    Will need to discuss statin therapy on next visit for  patient with diabetes      Normocytic anemia  intermittent issue Currently low.  No syncope onor dizziness nor fatigue nor DOE - not taking iron nor MVI supplements   Marijuana smoker ([place] -longstanding issue - Has been counseled many times in past that it could make her vomit, but she insists that it helps decrease her vomiting when it occurs.    Hematuria    Two episodes in last year of urinalysis with 6-30 rbc/hpf, 01/17 and 05/17.  S/P Hysterectomy Need to check urine next visit. Likely needs urology referral for cystoscopy arranged next ov in 3 to 4 months.    Abdominal Pelvic CT 06/09/15 showed, "Bladder wall thickening question due to infection, infiltrative process or incomplete distention.  Persistent mild RIGHT hydronephrosis and proximal RIGHT ureteral dilatation with BILATERAL perinephric stranding ; these changes  could be related to urinary tract infection, and correlation with urinalysis is recommended.   Abdominal Pelvic CT 01/2015 showed, "Bilateral perinephric stranding, right worse than left with mild right hydroureteronephrosis. Small amount of right perinephric fluid. These findings can  be seen in the setting of pyelonephritis. Correlate with urinalysis.  Mild bladder wall thickening which may be secondary to underdistention versus cystitis."      Encounter for chronic pain management    I reivewed [place] [facility] today which showed no controlled prescriptions filled within the last 6 months.        Other Visit Diagnoses    Gastroparesis    -  Primary -longstanding issue - Stable    Relevant Medications    oxyCODONE (OXY IR/ROXICODONE) 5 MG immediate release tablet    LORazepam (ATIVAN) 0.5 MG tablet      Smoking marajuana 1-2 times a week.  [place] going [place] her [place]  She is uncertain if she will be returning or not.   Review of Systems See HPI    Objective:   Physical Exam VS reviewed Gen: NAD, groomed, social Cor RRR, no m, g, r, nl  pmi Diabetic Foot Exam - Simple   Simple Foot Form  Diabetic Foot exam was performed with the following findings:  Yes 07/16/2016  4:52 PM  Visual Inspection  See comments:  Yes  Sensation Testing  See comments:  Yes  Pulse Check  Posterior Tibialis and Dorsalis pulse intact bilaterally:  Yes  Comments  0/5 monofilament bilateral soles Hammer toes x 8.  No hot spots or skin breaks.  Skin supple.     Ext: no edema  Lung: BCTA, no Acc mm work Abd: soft, (+) BS, ND, NT       Assessment & Plan:

## 2016-07-16 NOTE — Assessment & Plan Note (Signed)
Established problem. Stable. Continue current therapy Will need iPTH checked next visit. Not on Cholecalciferol.

## 2016-07-16 NOTE — Assessment & Plan Note (Signed)
New problem Need to check cbc with anemia panel, SPEP next ov

## 2016-07-16 NOTE — Assessment & Plan Note (Signed)
Lab Results  Component Value Date   HGBA1C 8.1* 07/03/2016  Adequate control in a patient with gastroparesis and autonomic neuropathy that raises the risk of hypoglycemia and failure to experience warning symptoms of hypoglycemia. Continue current Insulin regiment.    Preventitive Health Care             Daily Aspirin: Need to address next visit             Statin:Need to address next visit             Dental evaluation in 51-month: actively seeing dentist for teeth extractions and denture manufacture             Recent eGFR: 43 ml/min 07/02/16             ACEI: on Losartan   Eye Exam: Need to address next visit  Foot Exam: done  Diet pattern: Need to address next visit  Exercise: Need to address next visit

## 2016-07-16 NOTE — Assessment & Plan Note (Signed)
Established problem. Stable. Continue current therapy On pregabalin with effectiveness and tolerance

## 2016-07-16 NOTE — Assessment & Plan Note (Signed)
Established problem. Stable. Reiterated that marijuana can precipitate vomiting.  Pt believes it acts as an antiemetic when she is nauseous.

## 2016-07-16 NOTE — Assessment & Plan Note (Signed)
I reivewed [place] [facility] today which showed no controlled prescriptions filled within the last 6 months.

## 2016-08-03 ENCOUNTER — Observation Stay (HOSPITAL_COMMUNITY)
Admission: EM | Admit: 2016-08-03 | Discharge: 2016-08-05 | Disposition: A | Discharge status: Home / Self Care | Payer: Medicare Other | Attending: Family Medicine | Admitting: Family Medicine

## 2016-08-03 ENCOUNTER — Emergency Department (HOSPITAL_COMMUNITY): Payer: Medicare Other

## 2016-08-03 ENCOUNTER — Encounter (HOSPITAL_COMMUNITY): Payer: Self-pay | Admitting: Emergency Medicine

## 2016-08-03 DIAGNOSIS — I129 Hypertensive chronic kidney disease with stage 1 through stage 4 chronic kidney disease, or unspecified chronic kidney disease: Secondary | ICD-10-CM | POA: Diagnosis not present

## 2016-08-03 DIAGNOSIS — I10 Essential (primary) hypertension: Secondary | ICD-10-CM | POA: Diagnosis not present

## 2016-08-03 DIAGNOSIS — R1084 Generalized abdominal pain: Secondary | ICD-10-CM | POA: Diagnosis not present

## 2016-08-03 DIAGNOSIS — R079 Chest pain, unspecified: Secondary | ICD-10-CM

## 2016-08-03 DIAGNOSIS — K5900 Constipation, unspecified: Secondary | ICD-10-CM | POA: Insufficient documentation

## 2016-08-03 DIAGNOSIS — Z794 Long term (current) use of insulin: Secondary | ICD-10-CM | POA: Insufficient documentation

## 2016-08-03 DIAGNOSIS — F1210 Cannabis abuse, uncomplicated: Secondary | ICD-10-CM | POA: Insufficient documentation

## 2016-08-03 DIAGNOSIS — E1122 Type 2 diabetes mellitus with diabetic chronic kidney disease: Secondary | ICD-10-CM | POA: Insufficient documentation

## 2016-08-03 DIAGNOSIS — R0789 Other chest pain: Secondary | ICD-10-CM | POA: Diagnosis not present

## 2016-08-03 DIAGNOSIS — E1310 Other specified diabetes mellitus with ketoacidosis without coma: Principal | ICD-10-CM | POA: Insufficient documentation

## 2016-08-03 DIAGNOSIS — N179 Acute kidney failure, unspecified: Secondary | ICD-10-CM | POA: Diagnosis not present

## 2016-08-03 DIAGNOSIS — G8929 Other chronic pain: Secondary | ICD-10-CM | POA: Insufficient documentation

## 2016-08-03 DIAGNOSIS — R1110 Vomiting, unspecified: Secondary | ICD-10-CM

## 2016-08-03 DIAGNOSIS — N184 Chronic kidney disease, stage 4 (severe): Secondary | ICD-10-CM | POA: Insufficient documentation

## 2016-08-03 DIAGNOSIS — F339 Major depressive disorder, recurrent, unspecified: Secondary | ICD-10-CM

## 2016-08-03 DIAGNOSIS — K3184 Gastroparesis: Secondary | ICD-10-CM | POA: Diagnosis not present

## 2016-08-03 DIAGNOSIS — Z79899 Other long term (current) drug therapy: Secondary | ICD-10-CM | POA: Insufficient documentation

## 2016-08-03 DIAGNOSIS — F418 Other specified anxiety disorders: Secondary | ICD-10-CM | POA: Insufficient documentation

## 2016-08-03 DIAGNOSIS — R7989 Other specified abnormal findings of blood chemistry: Secondary | ICD-10-CM

## 2016-08-03 DIAGNOSIS — E1143 Type 2 diabetes mellitus with diabetic autonomic (poly)neuropathy: Secondary | ICD-10-CM | POA: Diagnosis not present

## 2016-08-03 DIAGNOSIS — F17210 Nicotine dependence, cigarettes, uncomplicated: Secondary | ICD-10-CM | POA: Insufficient documentation

## 2016-08-03 DIAGNOSIS — E1110 Type 2 diabetes mellitus with ketoacidosis without coma: Secondary | ICD-10-CM

## 2016-08-03 HISTORY — DX: Type 2 diabetes mellitus with ketoacidosis without coma: E11.10

## 2016-08-03 LAB — PREGNANCY, URINE: PREG TEST UR: NEGATIVE

## 2016-08-03 LAB — COMPREHENSIVE METABOLIC PANEL
ALK PHOS: 86 U/L (ref 38–126)
ALT: 21 U/L (ref 14–54)
AST: 26 U/L (ref 15–41)
Albumin: 4.7 g/dL (ref 3.5–5.0)
Anion gap: 21 — ABNORMAL HIGH (ref 5–15)
BUN: 56 mg/dL — AB (ref 6–20)
CO2: 24 mmol/L (ref 22–32)
CREATININE: 3.68 mg/dL — AB (ref 0.44–1.00)
Calcium: 10.2 mg/dL (ref 8.9–10.3)
Chloride: 83 mmol/L — ABNORMAL LOW (ref 101–111)
GFR, EST AFRICAN AMERICAN: 15 mL/min — AB (ref >60)
GFR, EST NON AFRICAN AMERICAN: 13 mL/min — AB (ref >60)
Glucose, Bld: 524 mg/dL (ref 65–99)
Potassium: 4 mmol/L (ref 3.5–5.1)
Sodium: 128 mmol/L — ABNORMAL LOW (ref 135–145)
TOTAL PROTEIN: 8.3 g/dL — AB (ref 6.5–8.1)
Total Bilirubin: 1.1 mg/dL (ref 0.3–1.2)

## 2016-08-03 LAB — BASIC METABOLIC PANEL
Anion gap: 12 (ref 5–15)
Anion gap: 12 (ref 5–15)
Anion gap: 10 (ref 5–15)
BUN: 57 mg/dL — ABNORMAL HIGH (ref 6–20)
BUN: 57 mg/dL — ABNORMAL HIGH (ref 6–20)
BUN: 53 mg/dL — AB (ref 6–20)
CALCIUM: 8.4 mg/dL — AB (ref 8.9–10.3)
CHLORIDE: 95 mmol/L — AB (ref 101–111)
CHLORIDE: 97 mmol/L — AB (ref 101–111)
CO2: 24 mmol/L (ref 22–32)
CO2: 24 mmol/L (ref 22–32)
CO2: 25 mmol/L (ref 22–32)
Calcium: 9 mg/dL (ref 8.9–10.3)
Calcium: 9 mg/dL (ref 8.9–10.3)
Chloride: 96 mmol/L — ABNORMAL LOW (ref 101–111)
Creatinine, Ser: 3.35 mg/dL — ABNORMAL HIGH (ref 0.44–1.00)
Creatinine, Ser: 3.27 mg/dL — ABNORMAL HIGH (ref 0.44–1.00)
Creatinine, Ser: 3.05 mg/dL — ABNORMAL HIGH (ref 0.44–1.00)
GFR calc Af Amer: 19 mL/min — ABNORMAL LOW (ref >60)
GFR calc non Af Amer: 15 mL/min — ABNORMAL LOW (ref >60)
GFR calc non Af Amer: 15 mL/min — ABNORMAL LOW (ref >60)
GFR, EST AFRICAN AMERICAN: 17 mL/min — AB (ref >60)
GFR, EST AFRICAN AMERICAN: 18 mL/min — AB (ref >60)
GFR, EST NON AFRICAN AMERICAN: 17 mL/min — AB (ref >60)
GLUCOSE: 256 mg/dL — AB (ref 65–99)
Glucose, Bld: 122 mg/dL — ABNORMAL HIGH (ref 65–99)
Glucose, Bld: 117 mg/dL — ABNORMAL HIGH (ref 65–99)
POTASSIUM: 3.3 mmol/L — AB (ref 3.5–5.1)
POTASSIUM: 3.2 mmol/L — AB (ref 3.5–5.1)
Potassium: 3.2 mmol/L — ABNORMAL LOW (ref 3.5–5.1)
SODIUM: 131 mmol/L — AB (ref 135–145)
SODIUM: 133 mmol/L — AB (ref 135–145)
Sodium: 131 mmol/L — ABNORMAL LOW (ref 135–145)

## 2016-08-03 LAB — CBG MONITORING, ED
GLUCOSE-CAPILLARY: 427 mg/dL — AB (ref 65–99)
Glucose-Capillary: 481 mg/dL — ABNORMAL HIGH (ref 65–99)
Glucose-Capillary: 231 mg/dL — ABNORMAL HIGH (ref 65–99)
Glucose-Capillary: 141 mg/dL — ABNORMAL HIGH (ref 65–99)
Glucose-Capillary: 116 mg/dL — ABNORMAL HIGH (ref 65–99)

## 2016-08-03 LAB — GLUCOSE, CAPILLARY
GLUCOSE-CAPILLARY: 109 mg/dL — AB (ref 65–99)
GLUCOSE-CAPILLARY: 156 mg/dL — AB (ref 65–99)
GLUCOSE-CAPILLARY: 205 mg/dL — AB (ref 65–99)
Glucose-Capillary: 107 mg/dL — ABNORMAL HIGH (ref 65–99)
Glucose-Capillary: 250 mg/dL — ABNORMAL HIGH (ref 65–99)
Glucose-Capillary: 203 mg/dL — ABNORMAL HIGH (ref 65–99)

## 2016-08-03 LAB — URINALYSIS, ROUTINE W REFLEX MICROSCOPIC
Glucose, UA: 250 mg/dL — AB
Ketones, ur: NEGATIVE mg/dL
NITRITE: NEGATIVE
PROTEIN: 100 mg/dL — AB
Specific Gravity, Urine: 1.02 (ref 1.005–1.030)
pH: 5.5 (ref 5.0–8.0)

## 2016-08-03 LAB — CBC
HCT: 40.8 % (ref 36.0–46.0)
HEMOGLOBIN: 14.2 g/dL (ref 12.0–15.0)
MCH: 31.3 pg (ref 26.0–34.0)
MCHC: 34.8 g/dL (ref 30.0–36.0)
MCV: 90.1 fL (ref 78.0–100.0)
Platelets: 222 10*3/uL (ref 150–400)
RBC: 4.53 MIL/uL (ref 3.87–5.11)
RDW: 13.1 % (ref 11.5–15.5)
WBC: 11.9 10*3/uL — ABNORMAL HIGH (ref 4.0–10.5)

## 2016-08-03 LAB — I-STAT TROPONIN, ED
Troponin i, poc: 0.69 ng/mL (ref 0.00–0.08)
Troponin i, poc: 0.52 ng/mL (ref 0.00–0.08)

## 2016-08-03 LAB — TSH: TSH: 1.426 u[IU]/mL (ref 0.350–4.500)

## 2016-08-03 LAB — URINE MICROSCOPIC-ADD ON

## 2016-08-03 LAB — RAPID URINE DRUG SCREEN, HOSP PERFORMED
AMPHETAMINES: NOT DETECTED
BARBITURATES: NOT DETECTED
Benzodiazepines: NOT DETECTED
COCAINE: NOT DETECTED
OPIATES: NOT DETECTED
TETRAHYDROCANNABINOL: POSITIVE — AB

## 2016-08-03 LAB — TROPONIN I
TROPONIN I: 0.57 ng/mL — AB (ref <0.03)
TROPONIN I: 0.57 ng/mL — AB (ref <0.03)
TROPONIN I: 0.46 ng/mL — AB (ref <0.03)

## 2016-08-03 LAB — LIPID PANEL
Cholesterol: 200 mg/dL (ref 0–200)
HDL: 51 mg/dL (ref >40)
LDL Cholesterol: 120 mg/dL — ABNORMAL HIGH (ref 0–99)
TRIGLYCERIDES: 147 mg/dL (ref <150)
Total CHOL/HDL Ratio: 3.9 RATIO
VLDL: 29 mg/dL (ref 0–40)

## 2016-08-03 LAB — MRSA PCR SCREENING: MRSA BY PCR: NEGATIVE

## 2016-08-03 MED ORDER — SODIUM CHLORIDE 0.9 % IV SOLN
INTRAVENOUS | Status: DC
  Administered 2016-08-03: 09:00:00 via INTRAVENOUS

## 2016-08-03 MED ORDER — SODIUM CHLORIDE 0.9 % IV BOLUS (SEPSIS)
500.0000 mL | Freq: Once | INTRAVENOUS | Status: AC
  Administered 2016-08-03: 500 mL via INTRAVENOUS

## 2016-08-03 MED ORDER — SODIUM CHLORIDE 0.9 % IV SOLN: INTRAVENOUS | Status: DC

## 2016-08-03 MED ORDER — LORAZEPAM 2 MG/ML IJ SOLN
0.5000 mg | Freq: Once | INTRAMUSCULAR | Status: AC
  Administered 2016-08-03: 0.5 mg via INTRAVENOUS
  Filled 2016-08-03: qty 1

## 2016-08-03 MED ORDER — PROMETHAZINE HCL 25 MG PO TABS
12.5000 mg | ORAL_TABLET | Freq: Four times a day (QID) | ORAL | Status: DC | PRN
  Administered 2016-08-04: 12.5 mg via ORAL
  Filled 2016-08-03: qty 1

## 2016-08-03 MED ORDER — HYDROMORPHONE HCL 1 MG/ML IJ SOLN
0.5000 mg | INTRAMUSCULAR | Status: DC | PRN
  Administered 2016-08-03 – 2016-08-05 (×8): 0.5 mg via INTRAVENOUS
  Filled 2016-08-03 (×8): qty 1

## 2016-08-03 MED ORDER — HEPARIN SODIUM (PORCINE) 5000 UNIT/ML IJ SOLN
5000.0000 [IU] | Freq: Three times a day (TID) | INTRAMUSCULAR | Status: DC
  Administered 2016-08-03 – 2016-08-05 (×6): 5000 [IU] via SUBCUTANEOUS
  Filled 2016-08-03 (×6): qty 1

## 2016-08-03 MED ORDER — POTASSIUM CHLORIDE 10 MEQ/100ML IV SOLN: 10.0000 meq | INTRAVENOUS | Status: DC

## 2016-08-03 MED ORDER — INSULIN REGULAR BOLUS VIA INFUSION
0.0000 [IU] | Freq: Three times a day (TID) | INTRAVENOUS | Status: DC
  Filled 2016-08-03: qty 10

## 2016-08-03 MED ORDER — DEXTROSE 50 % IV SOLN: 25.0000 mL | INTRAVENOUS | Status: DC | PRN

## 2016-08-03 MED ORDER — SODIUM CHLORIDE 0.9 % IV BOLUS (SEPSIS)
1000.0000 mL | Freq: Once | INTRAVENOUS | Status: AC
  Administered 2016-08-03: 1000 mL via INTRAVENOUS

## 2016-08-03 MED ORDER — DEXTROSE-NACL 5-0.45 % IV SOLN
INTRAVENOUS | Status: DC
  Administered 2016-08-03: 11:00:00 via INTRAVENOUS

## 2016-08-03 MED ORDER — SODIUM CHLORIDE 0.9 % IV SOLN
INTRAVENOUS | Status: DC
  Administered 2016-08-03: 1.6 [IU]/h via INTRAVENOUS

## 2016-08-03 MED ORDER — DEXTROSE-NACL 5-0.45 % IV SOLN
INTRAVENOUS | Status: DC
  Administered 2016-08-03: 13:00:00 via INTRAVENOUS

## 2016-08-03 MED ORDER — NITROGLYCERIN 0.4 MG SL SUBL
0.4000 mg | SUBLINGUAL_TABLET | SUBLINGUAL | Status: DC | PRN
  Administered 2016-08-03 (×2): 0.4 mg via SUBLINGUAL
  Filled 2016-08-03 (×2): qty 1

## 2016-08-03 MED ORDER — INSULIN ASPART 100 UNIT/ML ~~LOC~~ SOLN
0.0000 [IU] | Freq: Three times a day (TID) | SUBCUTANEOUS | Status: DC
  Administered 2016-08-04: 3 [IU] via SUBCUTANEOUS
  Administered 2016-08-04 (×2): 2 [IU] via SUBCUTANEOUS
  Administered 2016-08-05: 1 [IU] via SUBCUTANEOUS

## 2016-08-03 MED ORDER — HYDROMORPHONE HCL 1 MG/ML IJ SOLN
0.5000 mg | INTRAMUSCULAR | Status: DC | PRN
  Administered 2016-08-03: 0.5 mg via INTRAVENOUS
  Filled 2016-08-03: qty 1

## 2016-08-03 MED ORDER — POTASSIUM CHLORIDE 10 MEQ/100ML IV SOLN
10.0000 meq | INTRAVENOUS | Status: AC
  Administered 2016-08-03 (×4): 10 meq via INTRAVENOUS
  Filled 2016-08-03 (×4): qty 100

## 2016-08-03 MED ORDER — INSULIN GLARGINE 100 UNIT/ML ~~LOC~~ SOLN
10.0000 [IU] | Freq: Every day | SUBCUTANEOUS | Status: DC
  Administered 2016-08-03 – 2016-08-04 (×2): 10 [IU] via SUBCUTANEOUS
  Filled 2016-08-03 (×4): qty 0.1

## 2016-08-03 MED ORDER — SODIUM CHLORIDE 0.9 % IV SOLN
INTRAVENOUS | Status: DC
  Administered 2016-08-03: 4.2 [IU]/h via INTRAVENOUS
  Filled 2016-08-03: qty 2.5

## 2016-08-03 MED ORDER — PROMETHAZINE HCL 12.5 MG RE SUPP
12.5000 mg | Freq: Four times a day (QID) | RECTAL | Status: DC | PRN
  Administered 2016-08-03: 12.5 mg via RECTAL
  Filled 2016-08-03 (×3): qty 1

## 2016-08-03 MED ORDER — PROMETHAZINE HCL 25 MG RE SUPP: 12.5000 mg | Freq: Four times a day (QID) | RECTAL | Status: DC | PRN

## 2016-08-03 MED ORDER — PROMETHAZINE HCL 25 MG/ML IJ SOLN
12.5000 mg | Freq: Four times a day (QID) | INTRAMUSCULAR | Status: DC | PRN
  Administered 2016-08-03 – 2016-08-05 (×3): 12.5 mg via INTRAVENOUS
  Filled 2016-08-03 (×4): qty 1

## 2016-08-03 MED ORDER — METOCLOPRAMIDE HCL 5 MG/ML IJ SOLN
5.0000 mg | Freq: Once | INTRAMUSCULAR | Status: AC
  Administered 2016-08-03: 5 mg via INTRAVENOUS
  Filled 2016-08-03: qty 2

## 2016-08-03 NOTE — ED Notes (Signed)
DO Rumley made this RN aware that she received page regarding patient's troponin of 0.57

## 2016-08-03 NOTE — ED Notes (Signed)
MD at bedside. 

## 2016-08-03 NOTE — ED Notes (Signed)
Patient transported to X-ray 

## 2016-08-03 NOTE — ED Notes (Signed)
Admitting MD aware of pts cbg of 141, advised they would like patient to continue on insulin drip and d50/0.45ns at this time

## 2016-08-03 NOTE — ED Notes (Signed)
Verified with family medicine that patient still needs step down bed, flow manager advised this rn that md needs to put in step down order, md made aware of need for step down bed request.

## 2016-08-03 NOTE — Consult Note (Signed)
Cardiology Consult    Patient ID: SRAVYA GLADYS MRN: [id number], DOB/AGE: [birth year]   Admit date: 08/03/2016 Date of Consult: 08/03/2016  Primary Physician: Acquanetta Sit, MD Reason for Consult: Chest Pain Primary Cardiologist: Seen in Consult by Dr. Gwenlyn Found in 2014 Requesting Provider: Dr. Gerlean Ren   History of Present Illness    [name] [name] is a 50 y.o. female with past medical history of Type 2 DM, Stage 3 CKD, HTN, HLD, and substance use (tobacco and marijuana)  who presented to [name] ED on 08/03/2016 for evaluation of chest pain, found to be in DKA.  Patient reports she learned about the passing of a childhood friend on 07/28/2016 and became very emotional with the news. The following morning, she developed severe nausea and vomiting associated with chest discomfort. She has been unable to keep down any solids or liquids since. Says she has felt very weak and was unable to get out of bed for a few days. Yesterday, she went to her neighbors house and they encouraged her to call 911.   She denies any prior episodes of chest discomfort prior to the vomiting episodes. No association with exertion. No associated dyspnea or diaphoresis.   She smokes 3 cigarettes daily and uses Marijuana every other day. She last used Marijuana last Thursday.   Upon arrival to the ED, blood glucose was found to be elevated to 524 (last took her Lantus on 07/29/2016). Labs show a WBC of 11.9, Hgb 14.2, platelets 222. Creatinine 3.68 (was 1.57 one month ago). I-stat troponin 0.69 with repeat value of 0.52.  Of note, was admitted for chest pain from 04/30/2016 to 05/01/2016 but left AMA. Troponin levels that admission were 0.04, 0.06, and 0.07. CXR with no acute cardiopulmonary abnormalities. EKG shows NSR, HR 89 with no acute ST or T-wave changes.   Her last cardiac catheterization was in 2008 and showed normal LV function and normal coronary arteries. Echo in 04/2016 with normal systolic function and  Grade 2 DD noted.    Past Medical History   Past Medical History:  Diagnosis Date  . Anemia   . Anxiety   . At risk for polypharmacy 08/15/2014  . Biceps tendinopathy of right upper extremity 04/05/2014  . Blood transfusion   . Chronic generalized abdominal pain   . Chronic generalized abdominal pain   . Chronic kidney disease (CKD), stage III (moderate)   . Chronic leg cramping 07/18/2014  . CKD (chronic kidney disease) 08/17/2013   Seen by Dr Joelyn Oms 07/28/15 ([place]) - (see scanned document): 20 years diabetes, 15 years HTN, stable CKD stage III with proteinuria.  - CKD, discussed NSAID avoidance, diabetes control with target A1c<7.5%, and HTN control with SBP<140. F/u 6 months and repeat renal panel at that time. - Microalbuminuria: On lisinopril 40mg , suspected secondary due to diabetic nephropathy. - Chronic back pain: No NSDAIDs. - Say amlodipine causes nausea  - Advised not to ACE-i during vomiting episodes    . Complication of anesthesia    "problems waking up"  . Cyclic vomiting syndrome   . Delayed gastric emptying 05/12/2011    04/12/11 NUCLEAR MEDICINE GASTRIC EMPTYING SCAN Radiopharmaceutical: 2.0 mCi Tc-75m sulfur colloid. Comparison: None. Findings: At 1 hour, 81% of the counts remain in the stomach. At 2 hours, 53% remain. Normal is less than 30% at 2 hours. IMPRESSION: Moderate delay in gastric emptying.      . Depression   . Depression with anxiety 02/23/2007   See [name]  Pharm Note dated 05/06/2016.  IC Gwenlyn Saran) was called in and I did a brief assessment.   . Diabetic gastroparesis ([place])    /e-chart  . Diabetic ketoacidosis without coma associated with type 2 diabetes mellitus ([place])   . Elevated cholesterol 11/28/2015  . Emotional stress 12/30/2015   Stress after Breakup with partnes of 13 years on 12/27/2015   . Encounter for chronic pain management 01/06/2015   Indication for chronic opioid: Chronic back pain, cyclic vomiting Medication and dose:  oxycodone 5mg   # pills per month: 15 Last UDS date: 05/08/2015 - result pending Pain contract signed (Y/N): Yes, reviewed 01/06/2015  Date narcotic database last reviewed (include red flags): 05/08/2015 (no red flags).    . Esophagitis 04/18/2013   Documented by Dr. Henrene Pastor EGD 03/2011  He also consulted on her during hospitalization [id number] for cyclical vomiting. No repeat EGD necessary, treat esophagitis with PPI.    Marland Kitchen Essential hypertension 05/01/2016  . Essential hypertension, malignant 02/23/2007   Jan 2015 - stopped recently-started norvasc due to reports of GI symptoms. 02/2015 - holding lisinopril, adding hydralazine 10mg  TID, and will take 1/2 tab lisinopril when feeling better when not in cyclic vomiting flare   . GERD (gastroesophageal reflux disease)   . Heart murmur   . Hematuria 07/09/2015  . High risk social situation 08/31/2012  . Hot flashes 07/29/2011  . Hypertension   . INSOMNIA NOS 02/23/2007   Qualifier: Diagnosis of  By: Beryle Lathe    . Intermittent constipation 06/06/2015  . Intestinal impaction ([place])   . Intractable vomiting 07/02/2016  . Marijuana smoker, continuous ([place])   . MIGRAINE, UNSPEC., W/O INTRACTABLE MIGRAINE 02/23/2007   Qualifier: Diagnosis of  By: Beryle Lathe    . Myocardial infarction [facility]) 07/2007   "they say I've had a silent one; I don't know"  . Nausea & vomiting 06/10/2015  . NEUROPATHY, DIABETIC 02/23/2007  . Normocytic anemia   . Onychomycosis 03/25/2015  . PANCREATITIS 05/09/2008  . Pneumonia   . Protein-calorie malnutrition, severe ([place]) 12/17/2013  . Pure hypercholesterolemia 08/15/2014   Based on her choleserol, BP, smoker status, and that she is a diabetic, her 10 year ASCVD risk is 9.9% putting her in a category of risk that would benefit from high-intensity statin.    . Rotator cuff syndrome 04/19/2014  . Smoker   . TOBACCO DEPENDENCE 02/23/2007   2 cigarettes every few days 11/2012 3 cigarettes daily    . Type II diabetes mellitus ([place])   .  Uncontrolled secondary diabetes mellitus with stage 3 CKD (GFR 30-59) ([facility]) 10/23/2011   Nephropathy, gastroparesis  HgbA1c (06/2011) 7.6, 9.5, 12.2, 8.2, 10.9 (07/2012), 9.2 (02/11/2013), 7.7 (09/27/13)  Metformin: she has a history of cyclical vomiting; she would prefer not to take this medication  On lanuts 20U qam with improved control. 09/27/13   . Vitamin D deficiency 11/28/2014    Past Surgical History:  Procedure Laterality Date  . ABDOMINAL HYSTERECTOMY  02/2002  . CARDIAC CATHETERIZATION    . CATARACT EXTRACTION W/ INTRAOCULAR LENS  IMPLANT, BILATERAL    . CESAREAN SECTION    . CHOLECYSTECTOMY  1980's  . COLONOSCOPY    . DILATION AND EVACUATION  X3  . laparotomy and lysis of adhesions    . left hand surgery    . PORT-A-CATH placement  ~ 2008   right chest; "poor access; frequent hospitalizations"  . UPPER GI ENDOSCOPY       Allergies  Allergies  Allergen Reactions  . Bee  Venom Anaphylaxis    Required hospital visit to ER  . Tegaderm Ag Mesh [Silver] Dermatitis    And [place] tape- causes blisters and itching  . Acetaminophen Nausea And Vomiting  . Novolog [Insulin Aspart] Other (See Comments)    Muscles in feet cramp  . Ceftriaxone Other (See Comments)    Caused an ulcer in her mouth  . Erythromycin Nausea And Vomiting    Inpatient Medications    . insulin regular  0-10 Units Intravenous TID WC    Family History    Family History  Problem Relation Age of Onset  . Diabetes type II Mother   . Diabetes Mother   . Hypertension Mother   . Diabetes type II Sister   . Diabetes Sister   . Hypertension Sister   . Hypertension Brother     Social History    Social History   Social History  . Marital status: Single    Spouse name: N/A  . Number of children: N/A  . Years of education: N/A   Occupational History  . Not on file.   Social History Main Topics  . Smoking status: Current Some Day Smoker    Packs/day: 0.50    Years: 22.00    Types:  Cigarettes    Start date: 12/27/1982  . Smokeless tobacco: Never Used     Comment: THC 1-2 times per day almost daily  . Alcohol use No  . Drug use:     Types: Marijuana     Comment: Current Daily to weekly use  . Sexual activity: Yes    Birth control/ protection: None   Other Topics Concern  . Not on file   Social History Narrative   Lives in [place] - some minimal family support   Has 10 grandchildren   Cares for her [place] (Mixed female [place] and [place])      Review of Systems    General:  No chills, fever, night sweats or weight changes.  Cardiovascular:  No dyspnea on exertion, edema, orthopnea, palpitations, paroxysmal nocturnal dyspnea. Positive for chest pain.  Dermatological: No rash, lesions/masses Respiratory: No cough, dyspnea Urologic: No hematuria, dysuria Abdominal:   No diarrhea, bright red blood per rectum, melena, or hematemesis. Positive for nausea and vomiting.  Neurologic:  No visual changes, wkns, changes in mental status. All other systems reviewed and are otherwise negative except as noted above.  Physical Exam    Blood pressure 97/60, pulse 106, temperature 98.3 F (36.8 C), temperature source Oral, resp. rate 21, height 5\' 1"  (1.549 m), weight 140 lb (63.5 kg), SpO2 100 %.  General: Pleasant, African American female appearing in NAD. Slightly tearful during the encounter.  Psych: Normal affect. Neuro: Alert and oriented X 3. Moves all extremities spontaneously. HEENT: Normal  Neck: Supple without bruits or JVD. Lungs:  Resp regular and unlabored, CTA without wheezing or rales. Heart: RRR no s3, s4, or murmurs. Tender to palpation along epigastrium and left pectoral region.  Abdomen: Soft, non-tender, non-distended, BS + x 4.  Extremities: No clubbing, cyanosis or edema. DP/PT/Radials 2+ and equal bilaterally.  Labs    Troponin [facility] Test)  Recent Labs  08/03/16 1006  TROPIPOC 0.52*    Recent Labs  08/03/16 1023   TROPONINI 0.57*   Lab Results  Component Value Date   WBC 11.9 (H) 08/03/2016   HGB 14.2 08/03/2016   HCT 40.8 08/03/2016   MCV 90.1 08/03/2016   PLT 222 08/03/2016  Recent Labs Lab 08/03/16 0730  NA 128*  K 4.0  CL 83*  CO2 24  BUN 56*  CREATININE 3.68*  CALCIUM 10.2  PROT 8.3*  BILITOT 1.1  ALKPHOS 86  ALT 21  AST 26  GLUCOSE 524*   Lab Results  Component Value Date   CHOL 152 06/02/2015   HDL 56 06/02/2015   LDLCALC 78 06/02/2015   TRIG 88 06/02/2015   No results found for: [facility]   Radiology Studies    Dg Chest 2 View  Result Date: 08/03/2016 CLINICAL DATA:  Midline and LEFT side chest pain this morning, midline stomach pain for 3 days with nausea and vomiting, hypertension, diabetes mellitus, smoker EXAM: CHEST  2 VIEW COMPARISON:  04/30/2016 FINDINGS: RIGHT subclavian Port-A-Cath with tip projecting over SVC. Normal heart size, mediastinal contours, and pulmonary vascularity. Lungs clear. No pleural effusion or pneumothorax. Bones unremarkable. IMPRESSION: No acute abnormalities. Electronically Signed   By: Lavonia Dana M.D.   On: 08/03/2016 08:06    EKG & Cardiac Imaging    EKG: NSR, HR 89 with no acute ST or T-wave changes.   Echocardiogram: 04/2016 Study Conclusions  - Left ventricle: The cavity size was normal. There was mild   concentric hypertrophy. Systolic function was normal. Wall motion   was normal; there were no regional wall motion abnormalities.   Features are consistent with a pseudonormal left ventricular   filling pattern, with concomitant abnormal relaxation and   increased filling pressure (grade 2 diastolic dysfunction).   Doppler parameters are consistent with high ventricular filling   pressure. - Aortic valve: Transvalvular velocity was within the normal range.   There was no stenosis. There was no regurgitation. - Mitral valve: Transvalvular velocity was within the normal range.   There was no evidence for stenosis. There  was trivial   regurgitation. - Left atrium: The atrium was moderately dilated. - Right ventricle: The cavity size was normal. Wall thickness was   normal. Systolic function was normal. - Atrial septum: No defect or patent foramen ovale was identified   by color flow Doppler. - Tricuspid valve: There was trivial regurgitation. - Pulmonary arteries: Systolic pressure was within the normal   range. PA peak pressure: 24 mm Hg (S).  CARDIAC CATHETERIZATION: 07/2007   PROCEDURES:  Left heart catheterization with cine coronary angiography,  left ventriculography and distal aortography.   INDICATIONS:  Ms. Dealva Matlick is a 50 year old, African American  female who has a 20-year history of type 2 diabetes mellitus.  She was  ad[facility] to [facility] yesterday hypertensive with subxiphoid  chest pressure/burning.  She was seen by Dr. Melvern Banker.  Due to her  longstanding history of diabetes, hypertension with borderline elevation  of troponin, definitive cardiac catheterization was recommended.   DESCRIPTION OF PROCEDURE:  After premedication with Versed 2 mg  intravenously, the patient was prepped and draped in the usual fashion.  The right femoral artery was punctured anteriorly and a 5-French sheath  was inserted.  Diagnostic catheterization was done utilizing 5-French,  Judkins-4, left and right coronary catheters.  A pigtail catheter was  used for RAO ventriculography.  Distal aortography was performed without  difficulty.  The patient tolerated the procedure well.   HEMODYNAMIC DATA:  Central aortic pressure was 90/60.  Left ventricular  pressure was 90/2.   ANGIOGRAPHIC DATA:  Left main coronary artery was angiographically  normal and essentially trifurcated into an LAD, an intermediate vessel  and a codominant left circumflex system.  The LAD was angiographically normal and gave rise to one diagonal  vessel, several septal perforating arteries and wrapped around  the LV  apex.   The intermediate vessel was a moderate size vessel which bifurcated and  was angiographically normal.   The circumflex vessel gave rise to three additional marginal vessels and  posterolateral vessel and was normal.   The right coronary artery was angiographically normal and gave rise to a  PDA and small PLA system.   RAO ventriculography revealed normal LV contractility.  There was  suggestion of left ventricle hypertrophy.  There was also a suggestion  of borderline mitral valve prolapse.  Distal aortography did not  demonstrate any renal artery stenosis or any significant aortoiliac  disease.   IMPRESSION:  1. Normal left ventricular function with evidence for left ventricle      hypertrophy and possible mild mitral valve prolapse.  2. Essentially normal coronary arteries.  3. No evidence for renal artery stenosis.  Assessment & Plan    1. Atypical Chest Pain - reports developing nausea and vomiting last Thursday and has since developed chest pain after having multiple vomiting episodes. No association with exertion. Has not consumed adequate hydration or food since onset and presented in DKA with a glucose of 524. - on examination, her epigastrium and pectoral region is very tender to touch.  - initial troponin values have been 0.69 with repeat value of 0.52, likely secondary to her AKI and DKA. - would follow troponin values. EKG is without acute ischemic changes. - would not pursue further ischemic evaluation at this time with her atypical symptoms and AKI (not a cath candidate with her creatinine of 3.68).  2. Elevated Troponin - values in the ED have been 0.69 with repeat value of 0.52. - overall, her chest pain appears atypical and EKG without acute ischemic changes. Elevation likely secondary to AKI and DKA.  - continue to cycle enzymes.   3. Acute on Chronic Stage 3 CKD - creatinine elevated to 3.68 on admission (was 1.57 one month ago). - per  admitting team  4. Substance Use - smokes 3 cigarettes daily and uses marijuana every other day, - cessation advised.   Signed, Erma Heritage, PA-C 08/03/2016, 11:17 AM Pager: [phone]   I have examined the patient and reviewed assessment and plan and discussed with patient.  Agree with above as stated.  Mildly elevated troponin in the setting of renal failure and DKA.  Atypical chest pain which has now resolved.  COntinue supportive therapy for her metabolic issues.  Needs to stop smoking.   Larae Grooms

## 2016-08-03 NOTE — ED Notes (Signed)
Attempted to obtain urine specimen for a second time; Pt still unable to provide sample

## 2016-08-03 NOTE — ED Triage Notes (Signed)
Pt arrives via [facility] for c/o left sided chest discomfort since Thursday night. Pt reports n/v associated with pain and has not taken her regular meds x3 days.Ems reports pain increases with movement and palpation, pt received 324mg  asa, 1 sl nitro and 4mg  zofran pta. Pt a/ox4.

## 2016-08-03 NOTE — ED Notes (Signed)
CBG 427; RN notified

## 2016-08-03 NOTE — ED Provider Notes (Signed)
[facility] DEPT Provider Note   CSN: [id number] Arrival date & time: 08/03/16  [id number]  First Provider Contact:  First MD Initiated Contact with Patient 08/03/16 [phone]        History   Chief Complaint Chief Complaint  Patient presents with  . Chest Pain    HPI [name] [name] is a 50 y.o. female.  [name] [name] is a 50 yo F pmhx of DM, HTN, HLD, CKD, and substance abuse who presents with chest pain since this morning. Patient states she was in her usual state of health until last Thursday when she began to feels sick and throw up. She has been experiencing daily non bloody non bilious emesis and has been unable to tolerate po or keep down her home meds. She said this morning she began to experience 9/10 L sided chest "heaviness" that was relieved with SL nitro in the ED. She also endorses some gradual onset SOB over the past few days. She is a current smoker and habitual marijuana user (last used Thursday). She denies recent travel, surgery, and history of blood clots. On ROS she endorses constipation, last BM was Friday.       Past Medical History:  Diagnosis Date  . Anemia   . Anxiety   . At risk for polypharmacy 08/15/2014  . Biceps tendinopathy of right upper extremity 04/05/2014  . Blood transfusion   . Chronic generalized abdominal pain   . Chronic generalized abdominal pain   . Chronic kidney disease (CKD), stage III (moderate)   . Chronic leg cramping 07/18/2014  . CKD (chronic kidney disease) 08/17/2013   Seen by Dr Joelyn Oms 07/28/15 ([place]) - (see scanned document): 20 years diabetes, 15 years HTN, stable CKD stage III with proteinuria.  - CKD, discussed NSAID avoidance, diabetes control with target A1c<7.5%, and HTN control with SBP<140. F/u 6 months and repeat renal panel at that time. - Microalbuminuria: On lisinopril 40mg , suspected secondary due to diabetic nephropathy. - Chronic back pain: No NSDAIDs. - Say amlodipine causes nausea  - Advised not to ACE-i  during vomiting episodes    . Complication of anesthesia    "problems waking up"  . Cyclic vomiting syndrome   . Delayed gastric emptying 05/12/2011    04/12/11 NUCLEAR MEDICINE GASTRIC EMPTYING SCAN Radiopharmaceutical: 2.0 mCi Tc-10m sulfur colloid. Comparison: None. Findings: At 1 hour, 81% of the counts remain in the stomach. At 2 hours, 53% remain. Normal is less than 30% at 2 hours. IMPRESSION: Moderate delay in gastric emptying.      . Depression   . Depression with anxiety 02/23/2007   See [facility] Note dated 05/06/2016.  IC Gwenlyn Saran) was called in and I did a brief assessment.   . Diabetic gastroparesis ([place])    /e-chart  . Diabetic ketoacidosis without coma associated with type 2 diabetes mellitus ([place])   . Elevated cholesterol 11/28/2015  . Emotional stress 12/30/2015   Stress after Breakup with partnes of 13 years on 12/27/2015   . Encounter for chronic pain management 01/06/2015   Indication for chronic opioid: Chronic back pain, cyclic vomiting Medication and dose: oxycodone 5mg   # pills per month: 15 Last UDS date: 05/08/2015 - result pending Pain contract signed (Y/N): Yes, reviewed 01/06/2015  Date narcotic database last reviewed (include red flags): 05/08/2015 (no red flags).    . Esophagitis 04/18/2013   Documented by Dr. Henrene Pastor EGD 03/2011  He also consulted on her during hospitalization [id number] for cyclical vomiting. No repeat EGD necessary,  treat esophagitis with PPI.    Marland Kitchen Essential hypertension 05/01/2016  . Essential hypertension, malignant 02/23/2007   Jan 2015 - stopped recently-started norvasc due to reports of GI symptoms. 02/2015 - holding lisinopril, adding hydralazine 10mg  TID, and will take 1/2 tab lisinopril when feeling better when not in cyclic vomiting flare   . GERD (gastroesophageal reflux disease)   . Heart murmur   . Hematuria 07/09/2015  . High risk social situation 08/31/2012  . Hot flashes 07/29/2011  . Hypertension   . INSOMNIA NOS 02/23/2007   Qualifier:  Diagnosis of  By: Beryle Lathe    . Intermittent constipation 06/06/2015  . Intestinal impaction ([place])   . Intractable vomiting 07/02/2016  . Marijuana smoker, continuous ([place])   . MIGRAINE, UNSPEC., W/O INTRACTABLE MIGRAINE 02/23/2007   Qualifier: Diagnosis of  By: Beryle Lathe    . Myocardial infarction [facility]) 07/2007   "they say I've had a silent one; I don't know"  . Nausea & vomiting 06/10/2015  . NEUROPATHY, DIABETIC 02/23/2007  . Normocytic anemia   . Onychomycosis 03/25/2015  . PANCREATITIS 05/09/2008  . Pneumonia   . Protein-calorie malnutrition, severe ([place]) 12/17/2013  . Pure hypercholesterolemia 08/15/2014   Based on her choleserol, BP, smoker status, and that she is a diabetic, her 10 year ASCVD risk is 9.9% putting her in a category of risk that would benefit from high-intensity statin.    . Rotator cuff syndrome 04/19/2014  . Smoker   . TOBACCO DEPENDENCE 02/23/2007   2 cigarettes every few days 11/2012 3 cigarettes daily    . Type II diabetes mellitus ([place])   . Uncontrolled secondary diabetes mellitus with stage 3 CKD (GFR 30-59) ([facility]) 10/23/2011   Nephropathy, gastroparesis  HgbA1c (06/2011) 7.6, 9.5, 12.2, 8.2, 10.9 (07/2012), 9.2 (02/11/2013), 7.7 (09/27/13)  Metformin: she has a history of cyclical vomiting; she would prefer not to take this medication  On lanuts 20U qam with improved control. 09/27/13   . Vitamin D deficiency 11/28/2014    Patient Active Problem List   Diagnosis Date Noted  . Essential hypertension 05/01/2016  . Diabetic gastroparesis associated with type 2 diabetes mellitus ([place]) 05/01/2016  . Chronic generalized abdominal pain   . Normocytic anemia   . Hematuria 07/09/2015  . Intermittent constipation 06/06/2015  . Encounter for chronic pain management 01/06/2015  . Vitamin D deficiency 11/28/2014  . Pure hypercholesterolemia 08/15/2014  . Chronic leg cramping 07/18/2014  . Chronic kidney disease (CKD), stage IV (severe) ([place]) 08/17/2013  .  High risk social situation 08/31/2012  . Allergic rhinitis, seasonal 05/04/2012  . Marijuana smoker ([place]) 11/03/2011  . Uncontrolled secondary diabetes mellitus with stage 3 CKD (GFR 30-59) ([place]) 10/23/2011  . Cyclic vomiting syndrome 08/03/2011  . Delayed gastric emptying 05/12/2011  . Chronic back pain 04/10/2007  . Depression with anxiety 02/23/2007  . TOBACCO DEPENDENCE 02/23/2007  . DM neuropathies ([place]) 02/23/2007  . GERD 01/27/2004    Past Surgical History:  Procedure Laterality Date  . ABDOMINAL HYSTERECTOMY  02/2002  . CARDIAC CATHETERIZATION    . CATARACT EXTRACTION W/ INTRAOCULAR LENS  IMPLANT, BILATERAL    . CESAREAN SECTION    . CHOLECYSTECTOMY  1980's  . COLONOSCOPY    . DILATION AND EVACUATION  X3  . laparotomy and lysis of adhesions    . left hand surgery    . PORT-A-CATH placement  ~ 2008   right chest; "poor access; frequent hospitalizations"  . UPPER GI ENDOSCOPY      OB History  Gravida Para Term Preterm AB Living   7       3 4    SAB TAB Ectopic Multiple Live Births     3             Home Medications    Prior to Admission medications   Medication Sig Start Date End Date Taking? Authorizing Provider  ACCU-CHEK SOFTCLIX LANCETS lancets 1 each by Other route 4 (four) times daily. for testing 04/12/16   Olam Idler, MD  carvedilol (COREG) 25 MG tablet Take 0.5 tablets (12.5 mg total) by mouth 2 (two) times daily with a meal. 06/21/16   Zenia Resides, MD  glucose blood (ACCU-CHEK ACTIVE STRIPS) test strip Use as instructed 05/06/16   Alveda Reasons, MD  hydrALAZINE (APRESOLINE) 10 MG tablet Take 1 tablet (10 mg total) by mouth 3 (three) times daily. 06/21/16   Zenia Resides, MD  insulin glargine (LANTUS) 100 UNIT/ML injection Inject 0.05-0.2 mLs (5-20 Units total) into the skin daily. Patient taking differently: Inject 5-6 Units into the skin daily. Patient takes 5 units daily.  If blood glucose is > 150 mg/dL, patient takes 6 units daily 04/12/16    Olam Idler, MD  Insulin Syringe-Needle U-100 (BD INSULIN SYRINGE ULTRAFINE) 31G X 15/64" 0.3 ML MISC 1 Syringe by Does not apply route daily. 05/21/16   Olam Idler, MD  LORazepam (ATIVAN) 0.5 MG tablet Take 1 tablet (0.5 mg total) by mouth 2 (two) times daily as needed for anxiety. 07/15/16   Blane Ohara McDiarmid, MD  losartan (COZAAR) 100 MG tablet Take 1 tablet (100 mg total) by mouth daily. 06/21/16   Zenia Resides, MD  oxyCODONE (OXY IR/ROXICODONE) 5 MG immediate release tablet Take 1 tablet (5 mg total) by mouth every 4 (four) hours as needed for severe pain. 07/15/16   Blane Ohara McDiarmid, MD  polyethylene glycol (MIRALAX / GLYCOLAX) packet Take 17 g by mouth daily as needed. May increase in 2-3 days to 2 times daily, Until stooling regularly 07/15/16   Blane Ohara McDiarmid, MD  pregabalin (LYRICA) 50 MG capsule TAKE 1 TABLET BY MOUTH AT BEDTIME AS NEEDED 07/15/16   Blane Ohara McDiarmid, MD  promethazine (PHENERGAN) 12.5 MG suppository Place 1 suppository (12.5 mg total) rectally every 6 (six) hours as needed for nausea. Patient not taking: Reported on 07/16/2016 07/15/16   Blane Ohara McDiarmid, MD  promethazine (PHENERGAN) 12.5 MG tablet Take 1 tablet (12.5 mg total) by mouth every 6 (six) hours as needed for nausea or vomiting. 07/15/16   Blane Ohara McDiarmid, MD    Family History Family History  Problem Relation Age of Onset  . Diabetes type II Mother   . Diabetes Mother   . Hypertension Mother   . Diabetes type II Sister   . Diabetes Sister   . Hypertension Sister   . Hypertension Brother     Social History Social History  Substance Use Topics  . Smoking status: Current Some Day Smoker    Packs/day: 0.50    Years: 22.00    Types: Cigarettes    Start date: 12/27/1982  . Smokeless tobacco: Never Used     Comment: THC 1-2 times per day almost daily  . Alcohol use No     Allergies   Bee venom; Tegaderm ag mesh [silver]; Acetaminophen; Novolog [insulin aspart]; Ceftriaxone; and  Erythromycin   Review of Systems Review of Systems  Constitutional: Negative.   HENT: Negative.   Eyes: Negative.  Respiratory: Positive for chest tightness and shortness of breath. Negative for cough and wheezing.   Cardiovascular: Positive for chest pain. Negative for palpitations and leg swelling.  Gastrointestinal: Positive for constipation, nausea and vomiting. Negative for diarrhea.  Endocrine: Negative.   Genitourinary: Negative.   Musculoskeletal: Negative.   Skin: Negative.   Allergic/Immunologic: Negative.   Neurological: Negative.   Hematological: Negative.   Psychiatric/Behavioral: Negative.      Physical Exam Updated Vital Signs BP 129/99   Pulse 112   Temp 98.3 F (36.8 C) (Oral)   Resp 13   SpO2 99%   Physical Exam  Constitutional: She is oriented to person, place, and time. She appears well-developed and well-nourished. No distress.  HENT:  Head: Normocephalic and atraumatic.  Eyes: Conjunctivae are normal.  Neck: Neck supple.  Cardiovascular: Normal rate, regular rhythm and normal heart sounds.   No murmur heard. Pulmonary/Chest: Effort normal and breath sounds normal. No respiratory distress.  Abdominal: Soft. Bowel sounds are normal. She exhibits no distension. There is no tenderness.  Musculoskeletal: She exhibits no edema.  Neurological: She is alert and oriented to person, place, and time.  Skin: Skin is warm and dry. No erythema.  Psychiatric: She has a normal mood and affect.  Nursing note and vitals reviewed.    ED Treatments / Results  Labs (all labs ordered are listed, but only abnormal results are displayed) Labs Reviewed  CBC  COMPREHENSIVE METABOLIC PANEL  URINE RAPID DRUG SCREEN, HOSP PERFORMED  I-STAT TROPOININ, ED    EKG  EKG Interpretation  Date/Time:  Tuesday August 03 2016 07:12:20 EDT Ventricular Rate:  111 PR Interval:    QRS Duration: 74 QT Interval:  330 QTC Calculation: 449 R Axis:   47 Text  Interpretation:  Sinus tachycardia Biatrial enlargement Borderline low voltage, extremity leads Nonspecific T abnormalities, lateral leads Confirmed by Ashok Cordia  MD, Lennette Bihari ([id number]) on 08/03/2016 7:24:22 AM       Radiology No results found.  Procedures Procedures (including critical care time)  Medications Ordered in ED Medications  metoCLOPramide (REGLAN) injection 5 mg (not administered)  sodium chloride 0.9 % bolus 500 mL (not administered)  LORazepam (ATIVAN) injection 0.5 mg (not administered)     Initial Impression / Assessment and Plan / ED Course  I have reviewed the triage vital signs and the nursing notes.  Pertinent labs & imaging results that were available during my care of the patient were reviewed by me and considered in my medical decision making (see chart for details).  Clinical Course  Comment By Time  MD reviewed I stat troponin  Velna Ochs, MD 08/08 [phone]  CMP reviewed  Velna Ochs, MD 08/08 [phone]   Chest pain: Elevated troponin, no acute ischemic changes on EKG. Prior cath in 2008 with no coronary disease. Cardiology consulted. Admitted to family medicine for management of DKA and AKI.    Final Clinical Impressions(s) / ED Diagnoses   Final diagnoses:  None    New Prescriptions New Prescriptions   No medications on file     Velna Ochs, MD 08/03/16 [place], MD 08/03/16 [phone]

## 2016-08-03 NOTE — ED Notes (Signed)
CBG 481; RN notified

## 2016-08-03 NOTE — ED Notes (Signed)
Pt given ice water, per [place], Therapist, sports.

## 2016-08-03 NOTE — ED Notes (Signed)
Pt's CBG 141.  Informed [place], RN.

## 2016-08-03 NOTE — Care Management CC44 (Signed)
Condition Code 44 Documentation Completed  Patient Details  Name: BERNARD KILLORAN MRN: [id number] Date of Birth: 1966/09/19   Condition Code 44 given:    Patient signature on Condition Code 44 notice:    Documentation of 2 MD's agreement:    Code 44 added to claim:       Lacretia Leigh, RN 08/03/2016, 3:06 PM

## 2016-08-03 NOTE — H&P (Signed)
[facility] Admission History and Physical Service Pager: [phone]  Patient name: [name] [name] Medical record number: [id number] Date of birth: [birth year] Age: 50 y.o. Gender: female  Primary Care Provider: MCDIARMID,TODD D, MD Consultants: Cardiology Code Status: Full  Chief Complaint: Nausea, Vomiting, Chest Pain  Assessment and Plan: [name] [name] is a 50 y.o. female presenting with chest pain and found to be in DKA. PMH is significant for Diabetes, CKD, Hypertension, Hyperlipidemia, Depression/Anxiety, and drug abuse.  # Diabetes Ketoacidosis: Blood sugar 524 at arrival to ED. Potassium normal at 4. Anion Gap elevated to 21. Currently taking Lantus 11units daily, last taken Thursday 8/3. Initiated on fluids and insulin drip in ED. Suspect caused by Dehydration from nausea and vomiting, however given presentation with chest pain, shortness of breath, and elevated troponin, must rule out MI as etiology. Last A1C 8.1 in 06/2016. - Admit to Step Down Unit, [place] attending - NPO - CBG q1hr - BMP q3hr - Will check urine pregnancy test, drug screen, urinalysis - Continue IV fluid with potassium per DKA protocol. Monitor for fluid overload given heart failure status. - Continue insulin drip - Holding home insulin - Dilaudid for pain given renal dysfunction and NPO status. Hopeful to wean off as soon as possible to oral pain medication with improvement of renal function. Per PCP, concern for opioid abuse so will not continue at discharge. - Phenergan suppository for nausea/vomiting - Consider IV Pepcid with improvement of renal function or oral Protonix when no longer NPO  # Chest Pain: Troponin elevated to 0.69. Renal dysfunction noted and may be preventing clearance. History of Myocardial Infarction in 2008. Chest Xray without significant findings. Cardiac Catheterization in 2008 with significant coronary artery disease and normal left ventricular function  with evidence of left ventricular hypertrophy and mild mitral prolapse. Echocardiogram from 04/2016 with grade 2 diastolic dysfunction, normal systolic function, EF 70. - Cardiac Monitoring - Troponin now and every 6hr - Will check lipid panel, tsh - Consult Cardiology, appreciate recommendations - EKG in am - Nitro as needed - Heparin for DVT prophylaxis given renal function and cardiac evaluation  # Acute on Chronic Kidney Disease: Creatinine at admission elevated to 3.68. Baseline Creatinine ~1.8. - IV fluids per DKA protocol - Monitor on BMPq3hr  # Hypertension: Currently prescribed Coreg 12.5mg  twice daily, Hydralazine 10mg  three times daily, Losartan 100mg  daily. - Normotensive despite holding medications for several days - Hold antihypertensives. Will add back as blood pressure improves with rehydration.  # Depression/Anxiety: Currently prescribed Ativan 0.5mg  twice daily as needed for anxiety. - Holding medication.   # Tobacco and Marijuana Abuse: - Refuses nicotine patch - UDS pending  FEN/GI: NPO Prophylaxis: Heparin  Disposition: Home pending improvement of DKA and cardiac evaluation  History of Present Illness:  [name] [name] is a 50 y.o. female presenting with chest pain. Reports daily nonbloody nonbilious vomiting since Thursday, 8/3. Due to nausea and vomiting, she has not been taking her medications including her Lantus. Has also had shortness of breath over the last several days. Last night she developed 9/10 left sided chest pain without radiation. Received 324mg  Aspirin, 1SL Nitro, and 4mg  Nitro by EMT on transfer. Chest pain relieved by Nitro, however occurred again in ED. Chest pain once again relieved by Nitroglycerin.  ED initiated fluids and insulin drip. Cardiology consulted in ED.  History of tobacco and marijuana abuse. Last marijuana use Thursday 8/3.  Review Of Systems: Per HPI  Otherwise the remainder of  the systems were negative.  Patient Active  Problem List   Diagnosis Date Noted  . Essential hypertension 05/01/2016  . Diabetic gastroparesis associated with type 2 diabetes mellitus ([place]) 05/01/2016  . Chronic generalized abdominal pain   . Normocytic anemia   . Hematuria 07/09/2015  . Intermittent constipation 06/06/2015  . Encounter for chronic pain management 01/06/2015  . Vitamin D deficiency 11/28/2014  . Pure hypercholesterolemia 08/15/2014  . Chronic leg cramping 07/18/2014  . Chronic kidney disease (CKD), stage IV (severe) ([place]) 08/17/2013  . High risk social situation 08/31/2012  . Allergic rhinitis, seasonal 05/04/2012  . Marijuana smoker ([place]) 11/03/2011  . Uncontrolled secondary diabetes mellitus with stage 3 CKD (GFR 30-59) ([place]) 10/23/2011  . Cyclic vomiting syndrome 08/03/2011  . Delayed gastric emptying 05/12/2011  . Chronic back pain 04/10/2007  . Depression with anxiety 02/23/2007  . TOBACCO DEPENDENCE 02/23/2007  . DM neuropathies ([place]) 02/23/2007  . GERD 01/27/2004    Past Medical History: Past Medical History:  Diagnosis Date  . Anemia   . Anxiety   . At risk for polypharmacy 08/15/2014  . Biceps tendinopathy of right upper extremity 04/05/2014  . Blood transfusion   . Chronic generalized abdominal pain   . Chronic generalized abdominal pain   . Chronic kidney disease (CKD), stage III (moderate)   . Chronic leg cramping 07/18/2014  . CKD (chronic kidney disease) 08/17/2013   Seen by Dr Joelyn Oms 07/28/15 ([place]) - (see scanned document): 20 years diabetes, 15 years HTN, stable CKD stage III with proteinuria.  - CKD, discussed NSAID avoidance, diabetes control with target A1c<7.5%, and HTN control with SBP<140. F/u 6 months and repeat renal panel at that time. - Microalbuminuria: On lisinopril 40mg , suspected secondary due to diabetic nephropathy. - Chronic back pain: No NSDAIDs. - Say amlodipine causes nausea  - Advised not to ACE-i during vomiting episodes    . Complication of anesthesia     "problems waking up"  . Cyclic vomiting syndrome   . Delayed gastric emptying 05/12/2011    04/12/11 NUCLEAR MEDICINE GASTRIC EMPTYING SCAN Radiopharmaceutical: 2.0 mCi Tc-72m sulfur colloid. Comparison: None. Findings: At 1 hour, 81% of the counts remain in the stomach. At 2 hours, 53% remain. Normal is less than 30% at 2 hours. IMPRESSION: Moderate delay in gastric emptying.      . Depression   . Depression with anxiety 02/23/2007   See [facility] Note dated 05/06/2016.  IC Gwenlyn Saran) was called in and I did a brief assessment.   . Diabetic gastroparesis ([place])    /e-chart  . Diabetic ketoacidosis without coma associated with type 2 diabetes mellitus ([place])   . Elevated cholesterol 11/28/2015  . Emotional stress 12/30/2015   Stress after Breakup with partnes of 13 years on 12/27/2015   . Encounter for chronic pain management 01/06/2015   Indication for chronic opioid: Chronic back pain, cyclic vomiting Medication and dose: oxycodone 5mg   # pills per month: 15 Last UDS date: 05/08/2015 - result pending Pain contract signed (Y/N): Yes, reviewed 01/06/2015  Date narcotic database last reviewed (include red flags): 05/08/2015 (no red flags).    . Esophagitis 04/18/2013   Documented by Dr. Henrene Pastor EGD 03/2011  He also consulted on her during hospitalization [id number] for cyclical vomiting. No repeat EGD necessary, treat esophagitis with PPI.    Marland Kitchen Essential hypertension 05/01/2016  . Essential hypertension, malignant 02/23/2007   Jan 2015 - stopped recently-started norvasc due to reports of GI symptoms. 02/2015 - holding lisinopril, adding  hydralazine 10mg  TID, and will take 1/2 tab lisinopril when feeling better when not in cyclic vomiting flare   . GERD (gastroesophageal reflux disease)   . Heart murmur   . Hematuria 07/09/2015  . High risk social situation 08/31/2012  . Hot flashes 07/29/2011  . Hypertension   . INSOMNIA NOS 02/23/2007   Qualifier: Diagnosis of  By: Beryle Lathe    . Intermittent  constipation 06/06/2015  . Intestinal impaction ([place])   . Intractable vomiting 07/02/2016  . Marijuana smoker, continuous ([place])   . MIGRAINE, UNSPEC., W/O INTRACTABLE MIGRAINE 02/23/2007   Qualifier: Diagnosis of  By: Beryle Lathe    . Myocardial infarction [facility]) 07/2007   "they say I've had a silent one; I don't know"  . Nausea & vomiting 06/10/2015  . NEUROPATHY, DIABETIC 02/23/2007  . Normocytic anemia   . Onychomycosis 03/25/2015  . PANCREATITIS 05/09/2008  . Pneumonia   . Protein-calorie malnutrition, severe ([place]) 12/17/2013  . Pure hypercholesterolemia 08/15/2014   Based on her choleserol, BP, smoker status, and that she is a diabetic, her 10 year ASCVD risk is 9.9% putting her in a category of risk that would benefit from high-intensity statin.    . Rotator cuff syndrome 04/19/2014  . Smoker   . TOBACCO DEPENDENCE 02/23/2007   2 cigarettes every few days 11/2012 3 cigarettes daily    . Type II diabetes mellitus ([place])   . Uncontrolled secondary diabetes mellitus with stage 3 CKD (GFR 30-59) ([facility]) 10/23/2011   Nephropathy, gastroparesis  HgbA1c (06/2011) 7.6, 9.5, 12.2, 8.2, 10.9 (07/2012), 9.2 (02/11/2013), 7.7 (09/27/13)  Metformin: she has a history of cyclical vomiting; she would prefer not to take this medication  On lanuts 20U qam with improved control. 09/27/13   . Vitamin D deficiency 11/28/2014    Past Surgical History: Past Surgical History:  Procedure Laterality Date  . ABDOMINAL HYSTERECTOMY  02/2002  . CARDIAC CATHETERIZATION    . CATARACT EXTRACTION W/ INTRAOCULAR LENS  IMPLANT, BILATERAL    . CESAREAN SECTION    . CHOLECYSTECTOMY  1980's  . COLONOSCOPY    . DILATION AND EVACUATION  X3  . laparotomy and lysis of adhesions    . left hand surgery    . PORT-A-CATH placement  ~ 2008   right chest; "poor access; frequent hospitalizations"  . UPPER GI ENDOSCOPY      Social History: Social History  Substance Use Topics  . Smoking status: Current Some Day Smoker     Packs/day: 0.50    Years: 22.00    Types: Cigarettes    Start date: 12/27/1982  . Smokeless tobacco: Never Used     Comment: THC 1-2 times per day almost daily  . Alcohol use No   Please also refer to relevant sections of EMR.  Family History: Family History  Problem Relation Age of Onset  . Diabetes type II Mother   . Diabetes Mother   . Hypertension Mother   . Diabetes type II Sister   . Diabetes Sister   . Hypertension Sister   . Hypertension Brother    Allergies and Medications: Allergies  Allergen Reactions  . Bee Venom Anaphylaxis    Required hospital visit to ER  . Tegaderm Ag Mesh [Silver] Dermatitis    And [place] tape- causes blisters and itching  . Acetaminophen Nausea And Vomiting  . Novolog [Insulin Aspart] Other (See Comments)    Muscles in feet cramp  . Ceftriaxone Other (See Comments)    Caused an ulcer in  her mouth  . Erythromycin Nausea And Vomiting   No current facility-administered medications on file prior to encounter.    Current Outpatient Prescriptions on File Prior to Encounter  Medication Sig Dispense Refill  . ACCU-CHEK SOFTCLIX LANCETS lancets 1 each by Other route 4 (four) times daily. for testing 200 each 0  . carvedilol (COREG) 25 MG tablet Take 0.5 tablets (12.5 mg total) by mouth 2 (two) times daily with a meal. 60 tablet 0  . glucose blood (ACCU-CHEK ACTIVE STRIPS) test strip Use as instructed 100 each 12  . hydrALAZINE (APRESOLINE) 10 MG tablet Take 1 tablet (10 mg total) by mouth 3 (three) times daily. 30 tablet 0  . insulin glargine (LANTUS) 100 UNIT/ML injection Inject 0.05-0.2 mLs (5-20 Units total) into the skin daily. (Patient taking differently: Inject 5-6 Units into the skin daily. Patient takes 5 units daily.  If blood glucose is > 150 mg/dL, patient takes 6 units daily) 10 mL 0  . Insulin Syringe-Needle U-100 (BD INSULIN SYRINGE ULTRAFINE) 31G X 15/64" 0.3 ML MISC 1 Syringe by Does not apply route daily. 90 each 1  . LORazepam  (ATIVAN) 0.5 MG tablet Take 1 tablet (0.5 mg total) by mouth 2 (two) times daily as needed for anxiety. 15 tablet 0  . losartan (COZAAR) 100 MG tablet Take 1 tablet (100 mg total) by mouth daily. 30 tablet 2  . oxyCODONE (OXY IR/ROXICODONE) 5 MG immediate release tablet Take 1 tablet (5 mg total) by mouth every 4 (four) hours as needed for severe pain. 30 tablet 0  . polyethylene glycol (MIRALAX / GLYCOLAX) packet Take 17 g by mouth daily as needed. May increase in 2-3 days to 2 times daily, Until stooling regularly 30 packet prn  . pregabalin (LYRICA) 50 MG capsule TAKE 1 TABLET BY MOUTH AT BEDTIME AS NEEDED 30 capsule 3  . promethazine (PHENERGAN) 12.5 MG suppository Place 1 suppository (12.5 mg total) rectally every 6 (six) hours as needed for nausea. (Patient not taking: Reported on 07/16/2016) 12 each 3  . promethazine (PHENERGAN) 12.5 MG tablet Take 1 tablet (12.5 mg total) by mouth every 6 (six) hours as needed for nausea or vomiting. 40 tablet 0    Objective: BP 96/72   Pulse 86   Temp 98.3 F (36.8 C) (Oral)   Resp 23   Ht 5\' 1"  (1.549 m)   Wt 140 lb (63.5 kg)   SpO2 99%   BMI 26.45 kg/m  Exam: General: 50yo female resting comfortably in no apparent distress Eyes: White sclera ENTM: Dry mucous membranes Neck: Supple, no lymphadenopathy Cardiovascular: S1 and S2 noted without murmur, regular rate and rhythm Respiratory: Clear to auscultation bilaterally, no increased work of breathing Abdomen: Diffuse tenderness, worse in epigastric region MSK: No edema noted, muscle strength 5/5 in upper and lower extremities Skin: No rashes noted Neuro: Sensation intact, no gross deficits Psych: Alert and oriented x3  Labs and Imaging: CBC BMET   Recent Labs Lab 08/03/16 0730  WBC 11.9*  HGB 14.2  HCT 40.8  PLT 222    Recent Labs Lab 08/03/16 0730  NA 128*  K 4.0  CL 83*  CO2 24  BUN 56*  CREATININE 3.68*  GLUCOSE 524*  CALCIUM 10.2    - Troponin 0.69  Dg Chest 2  View  Result Date: 08/03/2016 CLINICAL DATA:  Midline and LEFT side chest pain this morning, midline stomach pain for 3 days with nausea and vomiting, hypertension, diabetes mellitus, smoker EXAM: CHEST  2 VIEW COMPARISON:  04/30/2016 FINDINGS: RIGHT subclavian Port-A-Cath with tip projecting over SVC. Normal heart size, mediastinal contours, and pulmonary vascularity. Lungs clear. No pleural effusion or pneumothorax. Bones unremarkable. IMPRESSION: No acute abnormalities. Electronically Signed   By: Lavonia Dana M.D.   On: 08/03/2016 08:06   Lorna Few, DO 08/03/2016, 9:55 AM PGY-3, [place] Intern pager: [phone], text pages welcome

## 2016-08-03 NOTE — Care Management Obs Status (Signed)
[facility] NOTIFICATION   Patient Details  Name: [name] [name] MRN: [id number] Date of Birth: [birth year]   Medicare Observation Status Notification Given:       Lacretia Leigh, RN 08/03/2016, 3:06 PM

## 2016-08-03 NOTE — ED Notes (Signed)
Attempted to obtain urine specimen; Pt unable to provide one at this time 

## 2016-08-04 DIAGNOSIS — R0789 Other chest pain: Secondary | ICD-10-CM | POA: Diagnosis not present

## 2016-08-04 DIAGNOSIS — K3184 Gastroparesis: Secondary | ICD-10-CM | POA: Diagnosis not present

## 2016-08-04 DIAGNOSIS — E1310 Other specified diabetes mellitus with ketoacidosis without coma: Secondary | ICD-10-CM

## 2016-08-04 DIAGNOSIS — F339 Major depressive disorder, recurrent, unspecified: Secondary | ICD-10-CM

## 2016-08-04 DIAGNOSIS — E1143 Type 2 diabetes mellitus with diabetic autonomic (poly)neuropathy: Secondary | ICD-10-CM | POA: Diagnosis not present

## 2016-08-04 DIAGNOSIS — R1084 Generalized abdominal pain: Secondary | ICD-10-CM | POA: Diagnosis not present

## 2016-08-04 DIAGNOSIS — R1110 Vomiting, unspecified: Secondary | ICD-10-CM | POA: Diagnosis not present

## 2016-08-04 DIAGNOSIS — N179 Acute kidney failure, unspecified: Secondary | ICD-10-CM | POA: Diagnosis not present

## 2016-08-04 LAB — GLUCOSE, CAPILLARY
GLUCOSE-CAPILLARY: 97 mg/dL (ref 65–99)
GLUCOSE-CAPILLARY: 82 mg/dL (ref 65–99)
GLUCOSE-CAPILLARY: 157 mg/dL — AB (ref 65–99)
GLUCOSE-CAPILLARY: 173 mg/dL — AB (ref 65–99)
Glucose-Capillary: 216 mg/dL — ABNORMAL HIGH (ref 65–99)
Glucose-Capillary: 190 mg/dL — ABNORMAL HIGH (ref 65–99)

## 2016-08-04 LAB — BASIC METABOLIC PANEL
ANION GAP: 9 (ref 5–15)
ANION GAP: 8 (ref 5–15)
BUN: 53 mg/dL — ABNORMAL HIGH (ref 6–20)
BUN: 51 mg/dL — ABNORMAL HIGH (ref 6–20)
CALCIUM: 8.6 mg/dL — AB (ref 8.9–10.3)
CALCIUM: 8.9 mg/dL (ref 8.9–10.3)
CO2: 26 mmol/L (ref 22–32)
CO2: 25 mmol/L (ref 22–32)
Chloride: 98 mmol/L — ABNORMAL LOW (ref 101–111)
Chloride: 100 mmol/L — ABNORMAL LOW (ref 101–111)
Creatinine, Ser: 2.95 mg/dL — ABNORMAL HIGH (ref 0.44–1.00)
Creatinine, Ser: 2.79 mg/dL — ABNORMAL HIGH (ref 0.44–1.00)
GFR calc Af Amer: 20 mL/min — ABNORMAL LOW (ref >60)
GFR calc Af Amer: 21 mL/min — ABNORMAL LOW (ref >60)
GFR calc non Af Amer: 17 mL/min — ABNORMAL LOW (ref >60)
GFR calc non Af Amer: 19 mL/min — ABNORMAL LOW (ref >60)
GLUCOSE: 73 mg/dL (ref 65–99)
GLUCOSE: 112 mg/dL — AB (ref 65–99)
Potassium: 3.8 mmol/L (ref 3.5–5.1)
Potassium: 3.7 mmol/L (ref 3.5–5.1)
Sodium: 133 mmol/L — ABNORMAL LOW (ref 135–145)
Sodium: 133 mmol/L — ABNORMAL LOW (ref 135–145)

## 2016-08-04 LAB — CBC
HEMATOCRIT: 31 % — AB (ref 36.0–46.0)
Hemoglobin: 10.6 g/dL — ABNORMAL LOW (ref 12.0–15.0)
MCH: 30.8 pg (ref 26.0–34.0)
MCHC: 34.2 g/dL (ref 30.0–36.0)
MCV: 90.1 fL (ref 78.0–100.0)
Platelets: 145 10*3/uL — ABNORMAL LOW (ref 150–400)
RBC: 3.44 MIL/uL — ABNORMAL LOW (ref 3.87–5.11)
RDW: 13 % (ref 11.5–15.5)
WBC: 5.9 10*3/uL (ref 4.0–10.5)

## 2016-08-04 LAB — TROPONIN I: TROPONIN I: 0.34 ng/mL — AB (ref <0.03)

## 2016-08-04 LAB — URINE CULTURE: Culture: NO GROWTH

## 2016-08-04 MED ORDER — POLYETHYLENE GLYCOL 3350 17 G PO PACK
17.0000 g | PACK | Freq: Every day | ORAL | Status: DC
  Administered 2016-08-04 – 2016-08-05 (×2): 17 g via ORAL
  Filled 2016-08-04 (×2): qty 1

## 2016-08-04 MED ORDER — INSULIN STARTER KIT- PEN NEEDLES (ENGLISH)
1.0000 | Freq: Once | Status: AC
  Administered 2016-08-04: 1
  Filled 2016-08-04: qty 1

## 2016-08-04 MED ORDER — GI COCKTAIL ~~LOC~~
30.0000 mL | Freq: Two times a day (BID) | ORAL | Status: DC | PRN
  Administered 2016-08-04: 30 mL via ORAL
  Filled 2016-08-04: qty 30

## 2016-08-04 MED ORDER — CARVEDILOL 6.25 MG PO TABS
6.2500 mg | ORAL_TABLET | Freq: Two times a day (BID) | ORAL | Status: DC
  Administered 2016-08-04 – 2016-08-05 (×2): 6.25 mg via ORAL
  Filled 2016-08-04 (×2): qty 1

## 2016-08-04 MED ORDER — SODIUM CHLORIDE 0.9% FLUSH
10.0000 mL | INTRAVENOUS | Status: DC | PRN
  Administered 2016-08-04 – 2016-08-05 (×3): 10 mL
  Filled 2016-08-04 (×3): qty 40

## 2016-08-04 MED ORDER — ENSURE ENLIVE PO LIQD
237.0000 mL | Freq: Two times a day (BID) | ORAL | Status: DC
  Administered 2016-08-04 – 2016-08-05 (×2): 237 mL via ORAL

## 2016-08-04 MED ORDER — LIVING WELL WITH DIABETES BOOK
Freq: Once | Status: AC
  Administered 2016-08-04: 1
  Filled 2016-08-04: qty 1

## 2016-08-04 MED ORDER — LORAZEPAM 0.5 MG PO TABS
0.5000 mg | ORAL_TABLET | Freq: Two times a day (BID) | ORAL | Status: DC | PRN
  Administered 2016-08-04: 0.5 mg via ORAL
  Filled 2016-08-04: qty 1

## 2016-08-04 NOTE — Discharge Summary (Signed)
[facility] Discharge Summary  Patient name: [name] [name] Medical record number: [id number] Date of birth: October 15, 1966 Age: 50 y.o. Gender: female Date of Admission: 08/03/2016  Date of Discharge: 08/04/16 Admitting Physician: Kinnie Feil, MD  Primary Care Provider: MCDIARMID,TODD D, MD Consultants: cardiology  Indication for Hospitalization: DKA, chest pain  Discharge Diagnoses/Problem List:  Patient Active Problem List   Diagnosis Date Noted  . Recurrent major depressive disorder ([place])   . DKA (diabetic ketoacidoses) ([employer]) 08/03/2016  . Uncontrollable vomiting 07/02/2016  . Essential hypertension 05/01/2016  . Diabetic gastroparesis associated with type 2 diabetes mellitus ([place]) 05/01/2016  . Pain in the chest   . Abdominal pain, generalized   . Normocytic anemia   . Hematuria 07/09/2015  . Intermittent constipation 06/06/2015  . Encounter for chronic pain management 01/06/2015  . Vitamin D deficiency 11/28/2014  . Pure hypercholesterolemia 08/15/2014  . Chronic leg cramping 07/18/2014  . Chronic kidney disease (CKD), stage IV (severe) ([place]) 08/17/2013  . High risk social situation 08/31/2012  . Allergic rhinitis, seasonal 05/04/2012  . Marijuana smoker ([place]) 11/03/2011  . Uncontrolled secondary diabetes mellitus with stage 3 CKD (GFR 30-59) ([place]) 10/23/2011  . Cyclic vomiting syndrome 08/03/2011  . Delayed gastric emptying 05/12/2011  . Chronic back pain 04/10/2007  . Depression with anxiety 02/23/2007  . TOBACCO DEPENDENCE 02/23/2007  . DM neuropathies ([place]) 02/23/2007  . GERD 01/27/2004    Disposition: home  Discharge Condition: Stable  Discharge Exam:  General: NAD, sitting up eating breakfast, cooperative with exam HEENT: normocephalic, atraumatic, sclera anicteric, no thyromegaly Cardiovascular: RRR, no murmurs, rubs, or gallops Respiratory: CTAB, no increased work of breathing Abdomen: nontender to palpation,no guarding or  rebound, + BS Extremities: warm, well-perfused, no edema, clubbing, or cyanosis, distal pulses intact Neuro: no gross deficits, 5/5 strength bilaterally   Brief Hospital Course:  [name] Kasparian Wilsonis a 50 y.o. Female who presented with chest pain and found to be in DKA. PMH is significant for Diabetes, CKD, Hypertension, Hyperlipidemia, Depression/Anxiety, tobacco/marijuana use, cyclic vomiting.  Diabetic Ketoacidosis, resolved: Presented with Blood sugar of 524, epigastric abdominal pain, vomiting, general feeling of malaise with normal potassium at 4 and elevated anion gap at 21. Reports not taking medications in the setting of childhood friend passing away. Stabilized with IV Fluids, potassium, and insulin. Patient was transitioned to Lantus 10units at bedtime with SSI. Patient remained stable on this regimen and was discharged with Lantus 10 units at bedtime. Consider starting meal time insulin as outpatient. Of note, most recent hemoglobin A1c 8.1 on 07/03/16.   Chest Pain: Presented with chest pain and tightness relieved by nitroglycerin. Troponins were trended: 0.57>0.57>0.46>0.34.  EKG w/no ST changes, CXR normal. Cardiology was consulted and believed mild elevation was due to the AKI and DKA; recommended no further intervention at that time. Patient is to follow up with her cardiologist.   Acute on Chronic Kidney Disease: Elevated Creatinine to 3.68 on admission in the setting of DKA. Improved during hospitalization. Cr 2.27 on discharge (basline Cr ~1.8). Losartan was held on admission and at discharge due to elevated but improving creatinine and patient was also normotensive.   HTN: Meds held during hospitalization given normotension (Hydralazine, Losartan, Coreg). Restarted coreg 6.25mg  BID during hospitalization (at a lower dose due to normal blood pressure).  Other chronic conditions including epigastric abdominal pain, depression/anxiety, substance abuse were at baseline during the  admission.    Issues for Follow Up:  - follow up CBGs; may need titration  of Lantus and possible addition of meal time insulin. Of note, patient reports of "allergy" to novolog (cramps in LE) however, she tolerated sliding scale insulin while in hospital  - consider repeat BMP to follow creatinine (baeline ~1.8) - Consider baby aspirin for cardiovascular risk  - Hydralazine and Losartan held; restart as indicated - follow up constipation. Patient was instructed to titrate home miralax  - patient has follow up cardiology appointment in October   Significant Procedures: none  Significant Labs and Imaging:   Recent Labs Lab 08/03/16 0730 08/04/16 0800  WBC 11.9* 5.9  HGB 14.2 10.6*  HCT 40.8 31.0*  PLT 222 145*    Recent Labs Lab 08/03/16 0730  08/03/16 1513 08/03/16 1750 08/03/16 2320 08/04/16 0433 08/05/16 0330  NA 128*  < > 133* 131* 133* 133* 133*  K 4.0  < > 3.2* 3.2* 3.8 3.7 3.9  CL 83*  < > 97* 96* 98* 100* 100*  CO2 24  < > 24 25 26 25 29   GLUCOSE 524*  < > 117* 256* 73 112* 143*  BUN 56*  < > 57* 53* 53* 51* 43*  CREATININE 3.68*  < > 3.27* 3.05* 2.95* 2.79* 2.27*  CALCIUM 10.2  < > 9.0 8.4* 8.6* 8.9 9.1  ALKPHOS 86  --   --   --   --   --   --   AST 26  --   --   --   --   --   --   ALT 21  --   --   --   --   --   --   ALBUMIN 4.7  --   --   --   --   --   --   < > = values in this interval not displayed.  Results/Tests Pending at Time of Discharge: none  Discharge Medications:    Medication List    STOP taking these medications   hydrALAZINE 10 MG tablet Commonly known as:  APRESOLINE   losartan 100 MG tablet Commonly known as:  COZAAR     TAKE these medications   carvedilol 6.25 MG tablet Commonly known as:  COREG Take 1 tablet (6.25 mg total) by mouth 2 (two) times daily with a meal. What changed:  medication strength  how much to take   insulin glargine 100 UNIT/ML injection Commonly known as:  LANTUS Inject 0.1 mLs (10 Units total)  into the skin at bedtime. What changed:  how much to take  when to take this   LORazepam 0.5 MG tablet Commonly known as:  ATIVAN Take 1 tablet (0.5 mg total) by mouth 2 (two) times daily as needed for anxiety.   oxyCODONE 5 MG immediate release tablet Commonly known as:  Oxy IR/ROXICODONE Take 1 tablet (5 mg total) by mouth every 4 (four) hours as needed for severe pain.   polyethylene glycol packet Commonly known as:  MIRALAX / GLYCOLAX Take 17 g by mouth daily as needed. May increase in 2-3 days to 2 times daily, Until stooling regularly   pregabalin 50 MG capsule Commonly known as:  LYRICA TAKE 1 TABLET BY MOUTH AT BEDTIME AS NEEDED   promethazine 12.5 MG tablet Commonly known as:  PHENERGAN Take 1 tablet (12.5 mg total) by mouth every 6 (six) hours as needed for nausea or vomiting.       Discharge Instructions: Please refer to Patient Instructions section of EMR for full details.  Patient was counseled important signs  and symptoms that should prompt return to medical care, changes in medications, dietary instructions, activity restrictions, and follow up appointments.   Follow-Up Appointments: Follow-up Information    Quay Burow, MD Follow up on 10/05/2016.   Specialties:  Cardiology, Radiology Why:  Cardiology Follow-Up with Dr. Gwenlyn Found on 10/05/2016 at 10:30AM. Please call our office if needing to be seen prior to this scheduled appointment.   Contact information: [address] [place] [address] [phone]        Lockie Pares, MD Follow up on 08/10/2016.   Specialty:  Family Medicine Why:  Hospital Follow Up at [facility] 8/15 at 1:45PM Contact information: [place] [address] [phone]           Smiley Houseman, MD 08/05/2016, 12:17 PM [place]

## 2016-08-04 NOTE — Progress Notes (Signed)
Patient very eager to learn information to better control her diabetes.  Patient provided with insulin starter kit - pen needles, "living well with diabetes" book, and information for accessing diabetes education videos, RN briefly reviewed same with patient.   Patient advised would like to watch some videos and look through the book on her own and then discuss with RN as well as receive education on the insulin pen.  RN assisted patient in starting video 504: introduction to carb counting and advised patient to let RN know if she needs assistance and/or when she is ready to discuss education in more detail.

## 2016-08-04 NOTE — Progress Notes (Signed)
Inpatient Diabetes Program Recommendations  AACE/ADA: New Consensus Statement on Inpatient Glycemic Control (2015)  Target Ranges:  Prepandial:   less than 140 mg/dL      Peak postprandial:   less than 180 mg/dL (1-2 hours)      Critically ill patients:  140 - 180 mg/dL   Results for CISSY, SASSEEN (MRN [id number]) as of 08/04/2016 14:51  Ref. Range 08/04/2016 11:52  Glucose-Capillary Latest Ref Range: 65 - 99 mg/dL 216 (H)    Review of Glycemic Control  Diabetes history: DM2 Outpatient Diabetes medications: Lantus 5 units unless CBG >150 then takes 6 units daily Current orders for Inpatient glycemic control: Lantus 10 units + Novolog correction 0-9 tid with meals  Inpatient Diabetes Program Recommendations:    Please consider adding meal coverage 3 units tid if patient eats 50% and add Novolog correction hs 0-5 units. Spoke with patient and reviewed need to take her basal insulin regularly and check her CBGs @ home, increasing frequency of checks when she is sick. Reviewed sick day rules with patient. Patient has meter and strips @ home and has insulin. Patient acknowledges understanding of information reviewed. Will order DM videos to review with patient and Living Well with Diabetes book. Gave and reviewed basic handout on A1c, plate method, and signs/symptoms of hyperglycemia and hypoglycemia. Spoke with nurse Katharine Look who is reinforcing DM teaching and showing videos.  Thank you, Nani Gasser. Khiyan Crace, RN, MSN, CDE Inpatient Glycemic Control Team Team Pager [phone] (8am-5pm) 08/04/2016 2:57 PM

## 2016-08-04 NOTE — Progress Notes (Signed)
Patient transferring to [place].  Informed RN in report of patient's diabetes and insulin education status and that patient still needed teaching on insulin pen starter kit.

## 2016-08-04 NOTE — Progress Notes (Signed)
Family Medicine Teaching Service Daily Progress Note Intern Pager: [phone]  Patient name: [name] [name] Medical record number: [id number] Date of birth: [birth year] Age: 50 y.o. Gender: female  Primary Care Provider: MCDIARMID,TODD D, MD Consultants: cardiology Code Status: FULL  Pt Overview and Major Events to Date:  Admitted to Step Down Unit on 08/03/16  Assessment and Plan: [name] [name] is a 50 y.o. female presenting with chest pain and found to be in DKA. PMH is significant for Diabetes, CKD, Hypertension, Hyperlipidemia, Depression/Anxiety, and drug abuse.  Diabetes Ketoacidosis: Anion gap closed since 1:30pm on 08/03/16. On subq insulin. IVFluids discontinued. Blood sugar 82 this am. Per patient, previously taking Lantus 11units daily, last taken Thursday 8/3. Initiated on fluids and insulin drip in ED. Suspect caused by Dehydration from nausea and vomiting, but worked up for ACS. Last A1C 8.1 in 06/2016. - encourage po intake, d/c IV fluids - CBG 4x daily - BMP daily - Lantus 10u at bedtime; Sensitive SSI  - Dilaudid for pain given renal dysfunction and NPO status. Hopeful to wean off as soon as possible to oral pain medication with improvement of renal function. Per PCP, concern for opioid abuse so will not continue at discharge. - Phenergan and GI cocktail for nausea/vomiting  Chest Pain: Trops slightly elevated but downtrending 0.57>0.46>0.34. Renal dysfunction noted and may be preventing clearance. History of Myocardial Infarction in 2008. EKG normal sinus rhythm, no ST elevation or depression. Chest Xray without significant findings. Cardiac Catheterization in 2008 with significant coronary artery disease and normal left ventricular function with evidence of left ventricular hypertrophy and mild mitral prolapse. Echocardiogram from 04/2016 with grade 2 diastolic dysfunction, normal systolic function, EF 70. - Cardiac Monitoring - start coreg 6.25mg  BID (lower than home dose) -  consider ASA as outpatient  - cardiology consulted: continue metabolic metabolic issues; reports elevated troponin likely due to AKI and DKA  - Nitro as needed  Chronic Abdominal Pain: Epigastric pain is at baseline. No BM's in 5 days.  - Phenergan and GI cocktail  - Miralax  Acute on Chronic Kidney Disease: Improved to 2.79 this morning. Creatinine at admission elevated to 3.68. Baseline Creatinine ~1.8. - IV fluids discontinued transition to subq insulin - Monitor on BMP daily  Hypertension: Home medications include: Coreg 12.5mg  twice daily, Hydralazine 10mg  three times daily, Losartan 100mg  daily. - Normotensive despite holding medications for several days - Restart  Coreg 6.25 ID (at lower than home dose)  - Holding losartan 100mg  and hydralazine 10mg  TID as outpatient (normotensive currently)   Depression/Anxiety: Currently prescribed Ativan 0.5mg  twice daily as needed for anxiety. - home Ativan PRN   Tobacco and Marijuana Abuse: Last used marijuana 5 days prior to admission. Smokes ~3 cigarettes daily. - Refuses nicotine patch - UDS+ for THC  FEN/GI: heart healthy carb modified Prophylaxis: Heparin  Disposition: pending  Subjective:  Pt feels better this morning. Is eating breakfast and overall feeling better. Continues to have chronic epigastric abdominal pain with associated nausea that is responding to medications.   Objective: Temp:  [97.7 F (36.5 C)-98.4 F (36.9 C)] 97.7 F (36.5 C) (08/09 0822) Pulse Rate:  [64-106] 80 (08/09 0822) Resp:  [11-23] 16 (08/09 0822) BP: (96-136)/(60-96) 135/82 (08/09 0822) SpO2:  [98 %-100 %] 100 % (08/09 0822) Weight:  [62.2 kg (137 lb 2 oz)-63.5 kg (140 lb)] 62.2 kg (137 lb 2 oz) (08/08 1430) Physical Exam: General: NAD, sitting up eating breakfast, cooperative with exam HEENT: normocephalic, atraumatic, sclera anicteric Cardiovascular:  RRR, no murmurs, rubs, or gallops Respiratory: CTAB, no increased work of  breathing Abdomen: TTP in the epigastric region, slight TTP diffusely, no guarding or rebound, +BS Extremities: warm, well-perfused, no edema, clubbing, or cyanosis Neuro: no gross deficits  Laboratory:  Recent Labs Lab 08/03/16 0730  WBC 11.9*  HGB 14.2  HCT 40.8  PLT 222    Recent Labs Lab 08/03/16 0730  08/03/16 1750 08/03/16 2320 08/04/16 0433  NA 128*  < > 131* 133* 133*  K 4.0  < > 3.2* 3.8 3.7  CL 83*  < > 96* 98* 100*  CO2 24  < > 25 26 25   BUN 56*  < > 53* 53* 51*  CREATININE 3.68*  < > 3.05* 2.95* 2.79*  CALCIUM 10.2  < > 8.4* 8.6* 8.9  PROT 8.3*  --   --   --   --   BILITOT 1.1  --   --   --   --   ALKPHOS 86  --   --   --   --   ALT 21  --   --   --   --   AST 26  --   --   --   --   GLUCOSE 524*  < > 256* 73 112*  < > = values in this interval not displayed.  Imaging/Diagnostic Tests: none  Zandra Abts, Medical Student 08/04/2016, 8:56 AM [place] Intern pager: [phone], text pages welcome  UPPER LEVEL ADDENDUM  I have read the above note and made revisions highlighted in blue.  Smiley Houseman, MD PGY-2 [name] Pager [phone]

## 2016-08-04 NOTE — Progress Notes (Signed)
Patient advised she watched the following videos: 501- basic skills for controlling diabetes & 503 - managing your diabetes.  Patient advised she is planning to watch the video on carb counting again as she really wants to make sure she understands this.  Patient did not want to discuss videos, booklet, or insulin pen with RN at this time but advised she will continue to independently review the education and will ask RN questions as needed.

## 2016-08-04 NOTE — Progress Notes (Signed)
Patient trasfered from [facility] to 5W09 via wheelchair; alert and oriented x 4; complaints of pain on her back (medicated on 2H floor); IV saline locked in LAC and an implanted port right chest; skin intact. Orient patient to room and unit; instructed how to use the call bell and  fall risk precautions. Will continue to monitor the patient.

## 2016-08-05 DIAGNOSIS — E1310 Other specified diabetes mellitus with ketoacidosis without coma: Secondary | ICD-10-CM | POA: Diagnosis not present

## 2016-08-05 DIAGNOSIS — F339 Major depressive disorder, recurrent, unspecified: Secondary | ICD-10-CM | POA: Diagnosis not present

## 2016-08-05 DIAGNOSIS — I10 Essential (primary) hypertension: Secondary | ICD-10-CM | POA: Diagnosis not present

## 2016-08-05 DIAGNOSIS — R1084 Generalized abdominal pain: Secondary | ICD-10-CM | POA: Diagnosis not present

## 2016-08-05 DIAGNOSIS — R110 Nausea: Secondary | ICD-10-CM

## 2016-08-05 DIAGNOSIS — R1110 Vomiting, unspecified: Secondary | ICD-10-CM | POA: Diagnosis not present

## 2016-08-05 LAB — GLUCOSE, CAPILLARY
GLUCOSE-CAPILLARY: 333 mg/dL — AB (ref 65–99)
Glucose-Capillary: 138 mg/dL — ABNORMAL HIGH (ref 65–99)

## 2016-08-05 LAB — BASIC METABOLIC PANEL
ANION GAP: 4 — AB (ref 5–15)
BUN: 43 mg/dL — AB (ref 6–20)
CHLORIDE: 100 mmol/L — AB (ref 101–111)
CO2: 29 mmol/L (ref 22–32)
Calcium: 9.1 mg/dL (ref 8.9–10.3)
Creatinine, Ser: 2.27 mg/dL — ABNORMAL HIGH (ref 0.44–1.00)
GFR calc Af Amer: 28 mL/min — ABNORMAL LOW (ref >60)
GFR calc non Af Amer: 24 mL/min — ABNORMAL LOW (ref >60)
GLUCOSE: 143 mg/dL — AB (ref 65–99)
POTASSIUM: 3.9 mmol/L (ref 3.5–5.1)
Sodium: 133 mmol/L — ABNORMAL LOW (ref 135–145)

## 2016-08-05 MED ORDER — INSULIN GLARGINE 100 UNIT/ML ~~LOC~~ SOLN: 10.0000 [IU] | Freq: Every day | SUBCUTANEOUS | 0 refills | Status: DC

## 2016-08-05 MED ORDER — SENNA 8.6 MG PO TABS: 1.0000 | ORAL_TABLET | Freq: Once | ORAL | Status: DC

## 2016-08-05 MED ORDER — CARVEDILOL 6.25 MG PO TABS: 6.2500 mg | ORAL_TABLET | Freq: Two times a day (BID) | ORAL | 0 refills | Status: DC

## 2016-08-05 MED ORDER — HEPARIN SOD (PORK) LOCK FLUSH 100 UNIT/ML IV SOLN
500.0000 [IU] | INTRAVENOUS | Status: AC | PRN
  Administered 2016-08-05: 500 [IU]

## 2016-08-05 MED ORDER — POLYETHYLENE GLYCOL 3350 17 G PO PACK: 17.0000 g | PACK | Freq: Two times a day (BID) | ORAL | Status: DC

## 2016-08-05 NOTE — Care Management Note (Signed)
Case Management Note  Patient Details  Name: [name] [name] MRN: [id number] Date of Birth: 09/13/1966  Subjective/Objective:                 Spoke with patient in the room. She lives at home alone, drives. Uses [facility] on [address], and goes to [place] meter, denies having difficulties getting medications, or diabetic supplies. Uses Lantus PTA.   Action/Plan:  No CM needs identified.  Expected Discharge Date:                  Expected Discharge Plan:  Home/Self Care  In-House Referral:  NA  Discharge planning Services  CM Consult  Post Acute Care Choice:  NA Choice offered to:  NA  DME Arranged:  N/A DME Agency:  NA  HH Arranged:  NA HH Agency:  NA  Status of Service:     If discussed at [place] of Stay Meetings, dates discussed:    Additional Comments:  Carles Collet, RN 08/05/2016, 9:41 AM

## 2016-08-05 NOTE — Progress Notes (Signed)
Nsg Discharge Note  Admit Date:  08/03/2016 Discharge date: 08/05/2016   SHAMONIQUE ROGALLA to be D/C'd Home per MD order.  AVS completed.  Copy for chart, and copy for patient signed, and dated. Patient/caregiver able to verbalize understanding.  Discharge Medication:   Medication List    STOP taking these medications   hydrALAZINE 10 MG tablet Commonly known as:  APRESOLINE   losartan 100 MG tablet Commonly known as:  COZAAR     TAKE these medications   carvedilol 6.25 MG tablet Commonly known as:  COREG Take 1 tablet (6.25 mg total) by mouth 2 (two) times daily with a meal. What changed:  medication strength  how much to take   insulin glargine 100 UNIT/ML injection Commonly known as:  LANTUS Inject 0.1 mLs (10 Units total) into the skin at bedtime. What changed:  how much to take  when to take this   LORazepam 0.5 MG tablet Commonly known as:  ATIVAN Take 1 tablet (0.5 mg total) by mouth 2 (two) times daily as needed for anxiety.   oxyCODONE 5 MG immediate release tablet Commonly known as:  Oxy IR/ROXICODONE Take 1 tablet (5 mg total) by mouth every 4 (four) hours as needed for severe pain.   polyethylene glycol packet Commonly known as:  MIRALAX / GLYCOLAX Take 17 g by mouth daily as needed. May increase in 2-3 days to 2 times daily, Until stooling regularly   pregabalin 50 MG capsule Commonly known as:  LYRICA TAKE 1 TABLET BY MOUTH AT BEDTIME AS NEEDED   promethazine 12.5 MG tablet Commonly known as:  PHENERGAN Take 1 tablet (12.5 mg total) by mouth every 6 (six) hours as needed for nausea or vomiting.       Discharge Assessment: Vitals:   08/04/16 2303 08/05/16 0555  BP: (!) 106/59 (!) 118/59  Pulse: 65 (!) 55  Resp: 18 16  Temp: 98.3 F (36.8 C) 98.6 F (37 C)   Skin clean, dry and intact without evidence of skin break down, no evidence of skin tears noted. IV catheter discontinued intact. Site without signs and symptoms of complications - no  redness or edema noted at insertion site, patient denies c/o pain - only slight tenderness at site.  Dressing with slight pressure applied.  D/c Instructions-Education: Discharge instructions given to patient/family with verbalized understanding. D/c education completed with patient/family including follow up instructions, medication list, d/c activities limitations if indicated, with other d/c instructions as indicated by MD - patient able to verbalize understanding, all questions fully answered. Patient instructed to return to ED, call 911, or call MD for any changes in condition.  Patient escorted via [place], and D/C home via private auto.  Salley Slaughter, RN 08/05/2016 2:49 PM

## 2016-08-05 NOTE — Progress Notes (Addendum)
Nutrition Brief Note  Patient identified on the Malnutrition Screening Tool (MST) Report  Wt Readings from Last 15 Encounters:  08/03/16 137 lb 2 oz (62.2 kg)  07/15/16 145 lb (65.8 kg)  07/03/16 150 lb 6.4 oz (68.2 kg)  06/21/16 149 lb (67.6 kg)  06/01/16 150 lb 3.2 oz (68.1 kg)  05/06/16 154 lb 1.6 oz (69.9 kg)  05/05/16 154 lb (69.9 kg)  04/30/16 146 lb (66.2 kg)  04/22/16 151 lb 14.4 oz (68.9 kg)  04/12/16 154 lb 11.2 oz (70.2 kg)  03/23/16 147 lb 8 oz (66.9 kg)  02/03/16 143 lb (64.9 kg)  01/23/16 146 lb 11.2 oz (66.5 kg)  01/21/16 151 lb 0.2 oz (68.5 kg)  01/14/16 151 lb (68.5 kg)   [name] [name] is a 50 y.o. female with past medical history of Type 2 DM, Stage 3 CKD, HTN, HLD, and substance use (tobacco and marijuana)  who presented to [name] ED on 08/03/2016 for evaluation of chest pain, found to be in DKA.  Pt reports hx of poor appetite and weight loss over the past 2 months, secondary to mouth pain after dental surgery (pt had entire top bridge of teeth removed). She reports that she has been trying to consume softer textured foods, she can feel her jaw bone when she chews for extended periods of time. Breakfast tray observed; pt consumed 50% of eggs and 100% of grits.   Pt shares that she is very interested in learning how better manage DM. She has watched the videos and was able to accurately teach back basic self-management principles. She reports she struggles with carb counting, but intends to re-watch the segment to obtain a better understanding of this concept. RD spent the majority of the visit discussing carb counting coals (1 carb serving= 15 grams of carbohydrates, with carbohydrate goal of 45-60 grams per meal). Encouraged pt to use measuring cups to ensure accurate portion sizes. Also educated pt on food label reading. Provided RD contact information and "Using Nutrition Labels: Carbohydrate" and "Carbohydrate Counting For People With Diabetes" handouts from [facility]  Nutrition Care Manual. Also discussed specific ways pt would mechanically alter foods for ease of intake. Teachback method used. Expect good compliance.  Body mass index is 25.91 kg/m. Patient meets criteria for overweight based on current BMI.   Current diet order is heart healthy/carb modified, patient is consuming approximately 20-75% of meals at this time. Labs and medications reviewed.   Per RN, pt for discharge today.  Brock Mokry A. Jimmye Norman, RD, LDN, CDE Pager: [phone] After hours Pager: [phone]

## 2016-08-05 NOTE — Progress Notes (Signed)
Family Medicine Teaching Service Daily Progress Note Intern Pager: [phone]  Patient name: [name] [name] Medical record number: [id number] Date of birth: 10-Apr-1966 Age: 50 y.o. Gender: female  Primary Care Provider: MCDIARMID,TODD D, MD Consultants: cardiology Code Status: FULL  Pt Overview and Major Events to Date:  Admitted to hospital on 08/03/16  Assessment and Plan: [name] Bladen Wilsonis a 50 y.o.femalepresenting with chest pain and found to be in DKA. PMH is significant for Diabetes, CKD, Hypertension, Hyperlipidemia, Depression/Anxiety, and drug abuse.  Diabetes Ketoacidosis, resolved:  Blood sugar 138 this am. Per patient, previously taking Lantus 7-15 units daily, last taken Thursday 8/3.  Last A1C 8.1 in 06/2016. - tolerating diet  - CBG 4x daily - BMP daily - Lantus 10u at bedtime; Sensitive SSI  - Dilaudid for pain given renal dysfunction and NPO status. Hopeful to wean off as soon as possible to oral pain medication with improvement of renal function. Per PCP, concern for opioid abuse so will not continue at discharge. - Phenergan and GI cocktail as needed for nausea/vomiting  Chest Pain, resolved: Trops slightly elevated but downtrended 0.57>0.46>0.34. Renal dysfunction noted and may be preventing clearance.  Echocardiogram from 04/2016 with grade 2 diastolic dysfunction, normal systolic function, EF 70. - Cardiac Monitoring - Coreg 6.25mg  BID (lower than home dose) - consider ASA as outpatient  - cardiology consulted: no further work up now, can follow up with pt's cardiologist (appointment made)   - Nitro as needed  Chronic Abdominal Pain, resolved this AM: Epigastric pain is at baseline. Reports last BM on 07/30/16 - Senna x1 today - Phenergan prn - continue home Miralax  Acute on Chronic Kidney Disease: Improved to 2.27 this morning. Creatinine at admission elevated to 3.68. Baseline Creatinine ~1.8. - holding ARB due to elevated Cr   Hypertension: Home  medications include: Coreg 12.5mg  twice daily, Hydralazine 10mg  three times daily, Losartan 100mg  daily. - Normotensive despite holding medications for several days - Coreg 6.25 ID (at lower than home dose)  - Holding losartan 100mg  and hydralazine 10mg  TID, restart as tolerated as outpatient (normotensive currently)  Constipation, chronic: Uses miralax at home. Unremarkable abdominal exam. Passing gas.  - Miralax  - add Senna today  Depression/Anxiety: Currently prescribed Ativan 0.5mg  twice daily as needed for anxiety. - home Ativan PRN   Tobacco and Marijuana Abuse: Last used marijuana 5 days prior to admission. Smokes ~3 cigarettes daily. UDS+ for THC - Refuses nicotine patch  FEN/GI: heart healthy carb modified Prophylaxis: Heparin  Disposition: Home on 08/05/16  Subjective:  Overall feels better, improved from yesterday. No chest pain. No BM since 07/30/16. Started eating again yesterday. Miralax yesterday did not help. No dysuria, or burning with urination.   Objective: Temp:  [98.1 F (36.7 C)-98.6 F (37 C)] 98.6 F (37 C) (08/10 0555) Pulse Rate:  [55-82] 55 (08/10 0555) Resp:  [13-18] 16 (08/10 0555) BP: (106-138)/(59-80) 118/59 (08/10 0555) SpO2:  [97 %-100 %] 100 % (08/10 0555) Physical Exam: General: NAD, cooperative with exam HEENT: normocephalic, atraumatic, sclera anicteric, no thyromegaly Cardiovascular: RRR, no murmurs, rubs, or gallops Respiratory: CTAB, no increased work of breathing Abdomen: nontender to palpation , no guarding or rebound, Back: Slight TTP over left side Extremities: warm, well-perfused, no edema, clubbing, or cyanosis, distal pulses intact Neuro: no gross deficits, 5/5 strength bilaterally  Laboratory:  Recent Labs Lab 08/03/16 0730 08/04/16 0800  WBC 11.9* 5.9  HGB 14.2 10.6*  HCT 40.8 31.0*  PLT 222 145*    Recent  Labs Lab 08/03/16 0730  08/03/16 2320 08/04/16 0433 08/05/16 0330  NA 128*  < > 133* 133* 133*  K 4.0   < > 3.8 3.7 3.9  CL 83*  < > 98* 100* 100*  CO2 24  < > 26 25 29   BUN 56*  < > 53* 51* 43*  CREATININE 3.68*  < > 2.95* 2.79* 2.27*  CALCIUM 10.2  < > 8.6* 8.9 9.1  PROT 8.3*  --   --   --   --   BILITOT 1.1  --   --   --   --   ALKPHOS 86  --   --   --   --   ALT 21  --   --   --   --   AST 26  --   --   --   --   GLUCOSE 524*  < > 73 112* 143*  < > = values in this interval not displayed.   Imaging/Diagnostic Tests: none  Smiley Houseman, MD 08/05/2016, 9:50 AM [place] Intern pager: [phone], text pages welcome

## 2016-08-05 NOTE — Discharge Instructions (Signed)
You were in the hospital for DKA which is now resolved. We increased your Lantus to 10 units at bedtime. Please continue to check your sugars.  We stopped your Hydralazine and Lorsartan because your kidney marker was elevated and your blood pressure was normal without these medications; we will re-evaluate your blood pressure at your follow up visit and add these medications back as indicated.  We also decreased the dose of your Coreg because your blood pressure was normal and we did not want your blood pressure to go low.   You have a hospital follow up visit at the family medicine clinic. Please be sure to make this appointment.  You reported that you have miralax at home. If 1 packet of miralax is not helping with your constipation, you can do 1 packet twice a day until you have a soft bowel movement. You can go back to daily if your stool is too runny.

## 2016-08-06 ENCOUNTER — Encounter: Payer: Self-pay | Admitting: *Deleted

## 2016-08-06 ENCOUNTER — Other Ambulatory Visit: Payer: Self-pay | Admitting: *Deleted

## 2016-08-06 ENCOUNTER — Other Ambulatory Visit: Payer: Self-pay

## 2016-08-06 DIAGNOSIS — E1169 Type 2 diabetes mellitus with other specified complication: Secondary | ICD-10-CM

## 2016-08-06 NOTE — Consult Note (Signed)
   [facility] CM Inpatient Consult   08/06/2016  TINA GRUNER [birth year] [phone]  Late entry for 08/05/16 1400:  Patient evaluated for community based chronic disease management services with [facility] as a benefit of patient's [facility]. Met patient at bedside to discuss needs for community post hospital follow up.  Patient states she is trying her best to maintain her health with her diabetes.  Admitted with DKA.  She states she sees her primary care provider, has transportation but often has difficulty affording her medications if new medicines are ordered after the first of the month.  This is her 4th time of being hospitalized in the past 6 months.  She states and tearful, "I never thought I would be in this shape at such a young age."   Spoke with her about [facility] services. Patient states, "I feel this will be helpful to some follow up when I leave."  Phone number verified and request a text, if possible, due to limited minutes on her phone.  Consent form signed.  Patient will receive post hospital discharge call and will be evaluated for monthly home visits for assessments and disease process education.  Patient given information and [facility] literature  With contact information.  Made Inpatient Case Manager aware that [facility] following. Of note, [facility] Care Management services does not replace or interfere with any services that are arranged by inpatient case management or social work.  For additional questions or referrals please contact:    Natividad Brood, RN BSN [facility] Liaison  [phone] business mobile phone Toll free office [phone]

## 2016-08-06 NOTE — Patient Outreach (Signed)
[facility]) Care Management [place] Telephone, Transition of Care, day 1 08/06/2016  [name] [name] October 22, 1966 [id number]  Successful telephone outreach to "[name] [name], 50 y/o female, followed by [name] [name] for transition of care after recent hospitalization August 8-10, 2017 for DKA, AKI, and chest pain.  Patient was treated with IV fluids, insulin and K+ supplementation.  Patient also noted to have CKD and HTN.  HIPAA verified.  Today, patient reports that she is doing well and feeling much better since her hospital visit.  Patient reports that she has all of her medications and is taking all as prescribed; Medication reconciliation performed today; Patient was recently discharged from hospital and all medications were thoroughly reviewed with her today.  Patient reports and accurate understanding of the general purpose, dosing and scheduling of her medications.   Patient also reports an accurate report of upcoming provider appointments, and together we verified that she has a scheduled follow up visit with her PCP next week, which patient states she plans to attend by driving herself to that appointment.  Patient currently denies community resource needs.  Self management of chronic disease state of Diabetes:  Patient reports that she is currently not checking her blood sugars "as often as" she should, and she agreed to begin to check her blood sugars "at least three times per day," until she is able to schedule [name] [name] in [name] visit with her primary [name] [name].  Patient also verbalized that she would like to know more about nutrition and carb-counting around diet for DM; and she set a goal today to begin a food journal for review, along with her recorded blood sugars, at initial [place] in-[name] visit to be scheduled by patient's primary [name] [name], [name] [name].  We discussed [name] services today, and I explained that I would be contacting  patient next week by telephone for ongoing follow up for transition of care after her recent hospitalization.  I explained that I was covering next week on Friday August 13, 2016 for her primary [name] [name], who would contact her upon her return to work to schedule an in-[name] visit.  I made sure that patient had my direct contact information, the main number for [name], and the 24-hour nurse line phone number.  Patient denied further concerns, questions, needs, or problems today.  Plan:  [name] [name] will continue to take her medications as prescribed and will attend all scheduled provider appointments.  [name] [name] will contact her providers promptly for any new concerns, questions, needs, or problems.  [name] [name] will begin monitoring and recording her blood sugars three times per day.  [name] [name] will begin keeping a food journal of her nutritional intake for comparison to her blood sugar levels.  [name] [name] outreach for transition of care to continue with telephone outreach scheduled for next week.  [facility] RN [name] will notify patient's PCP of [name] involvement in patient's care and will update patient's primary [facility] RN [name] upon her return.    [name] Rack, RN, BSN, [employer] [facility] Care Management  [phone]

## 2016-08-10 ENCOUNTER — Ambulatory Visit (INDEPENDENT_AMBULATORY_CARE_PROVIDER_SITE_OTHER): Payer: Medicare Other | Admitting: Internal Medicine

## 2016-08-10 VITALS — BP 112/53 | HR 67 | Temp 98.4°F | Ht 61.0 in | Wt 148.0 lb

## 2016-08-10 DIAGNOSIS — E1143 Type 2 diabetes mellitus with diabetic autonomic (poly)neuropathy: Secondary | ICD-10-CM

## 2016-08-10 DIAGNOSIS — K5900 Constipation, unspecified: Secondary | ICD-10-CM

## 2016-08-10 DIAGNOSIS — K3184 Gastroparesis: Secondary | ICD-10-CM

## 2016-08-10 DIAGNOSIS — K5909 Other constipation: Secondary | ICD-10-CM

## 2016-08-10 MED ORDER — GLYCERIN (LAXATIVE) 2.1 G RE SUPP: 1.0000 | Freq: Every day | RECTAL | 1 refills | Status: DC | PRN

## 2016-08-10 MED ORDER — LORAZEPAM 0.5 MG PO TABS: 0.5000 mg | ORAL_TABLET | Freq: Two times a day (BID) | ORAL | 0 refills | Status: DC | PRN

## 2016-08-10 MED ORDER — LUBIPROSTONE 8 MCG PO CAPS: 8.0000 ug | ORAL_CAPSULE | Freq: Two times a day (BID) | ORAL | 0 refills | Status: DC

## 2016-08-10 NOTE — Progress Notes (Signed)
   [name] Kerrin Mo, MD Phone: [phone]  Reason For Visit:  Hospital Follow up   DM, Type 2: CBGs:  Patient noted on Saturday a  mid-morning CBG 374 and evening CBG 156.  Rest of blood sugars have been less than 200.  Meds: Lantus 10 units in the AM  Reports taking lantus every morning, good compliance. Tolerating well w/o side-effects Any hypoglycemia episodes: Denies any recent episodes of hypoglycemia, voiced understanding of hypoglycemia  Denies polyuria, visual changes, numbness or tingling.  Constipation - Patient has not had a bowel movement in over 2 weeks. She states she feels miserable. She indicates miralax does not work, even when she titrates the dose up she is still not having a bowel movement. She has used sennakot in the past before and feels that this does not work well either. She feels very bloated from the constipation and miserable. She does take opioids as needed at times for pain relief but denies taking these daily. She states she eat fiber rich foods without much benefit   Nausea- Continues to have nausea following discharge from the hospital. She has phenergan both rectal and oral which she uses for relief.   Past Medical History Reviewed problem list.  Medications- reviewed and updated No additions to family history  Objective: BP (!) 112/53 (BP Location: Right Arm, Patient Position: Sitting, Cuff Size: Normal)   Pulse 67   Temp 98.4 F (36.9 C) (Oral)   Ht 5\' 1"  (1.549 m)   Wt 148 lb (67.1 kg)   BMI 27.96 kg/m  Gen: NAD, alert, cooperative with exam Cardio: regular rate and rhythm, S1S2 heard, no murmurs appreciated Pulm: clear to auscultation bilaterally, no wheezes, rhonchi or rales GI: soft,  Diffuses tenderness, slightly- distended, bowel sounds present, no hepatomegaly, no splenomegaly  Assessment/Plan: See problem based a/p  Intermittent constipation Will provide glycerin suppository - with instruction to  use 1-2 suppository as needed for bowel movement  Will start patient on Amitiza as trial to see if this improves constipation  Follow up with Dr. Wendy Poet   Diabetic gastroparesis associated with type 2 diabetes mellitus ([place]) A1C 8.1 with most blood sugars less than 200. Recent hx of hospitalization for elevated blood sugars during episode of gastroparesis while patient was not taking insulin. Discharge  Summary noted considering adding on meal time coverage. - Will refer to Dr. [name] for further recommendations - Patient to make an appointment with Dr. [name]  - Continue phenegran for nausea and ativan as needed ( provided 15 pills) - Discussed with Dr. Wendy Poet

## 2016-08-10 NOTE — Patient Instructions (Addendum)
For your constipation  - Please take glycerin suppository - 1 as needed  - Amitiza for constipation, please try once in the morning and once at night - for 1 month  -  Continue Lantus at 10 units. Follow up with Dr. [name] in the next couple of weeks, when you see him bring your blood sugar chart - 15 pills of Ativan for nausea and can use the phenergan as well  - Follow up with Dr. McDiramid with next two weeks - okay to double book him

## 2016-08-12 NOTE — Assessment & Plan Note (Signed)
Will provide glycerin suppository - with instruction to use 1-2 suppository as needed for bowel movement  Will start patient on Amitiza as trial to see if this improves constipation  Follow up with Dr. Wendy Poet

## 2016-08-12 NOTE — Assessment & Plan Note (Addendum)
A1C 8.1 with most blood sugars less than 200. Recent hx of hospitalization for elevated blood sugars during episode of gastroparesis while patient was not taking insulin. Discharge  Summary noted considering adding on meal time coverage. - Will refer to Dr. [name] for further recommendations - Patient to make an appointment with Dr. [name]  - Continue phenegran for nausea and ativan as needed ( provided 15 pills) - Discussed with Dr. Wendy Poet

## 2016-08-13 ENCOUNTER — Other Ambulatory Visit: Payer: Self-pay | Admitting: *Deleted

## 2016-08-13 ENCOUNTER — Ambulatory Visit: Payer: Self-pay | Admitting: *Deleted

## 2016-08-13 NOTE — Patient Outreach (Signed)
[facility]) Care Management [place] Telephone Outreach, Transition of Care, day 8 08/13/2016  SOMMAR BARBIE [birth year] [id number]  Unsuccessful telephone outreach to "Sharmon Leyden, 50 y/o female, followed by [place] for transition of care after recent hospitalization August 8-10, 2017 for DKA, AKI, and chest pain.  Patient was treated with IV fluids, insulin and K+ supplementation.  Patient also noted to have CKD and HTN.  HIPAA compliant voice mail message left for patient, asking her to return my call.  Plan:  [facility] RN CM will notify patient's primary [facility] RN CM, Loni Muse, of outreach attempt.  Oneta Rack, RN, BSN, [employer] [facility] Care Management  [phone]

## 2016-08-13 NOTE — Patient Outreach (Signed)
[facility]) Care Management [place] Telephone [place] of Care, day 8 08/13/2016  AMYRIE FEINSTEIN 1966/06/17 [id number]   Successful telephone outreach to "[name] [name], 50 y/o female, followed by [place] for transition of care after recent hospitalization August 8-10, 2017 for DKA, AKI, and chest pain.  Patient was treated with IV fluids, insulin and K+ supplementation.  Patient also noted to have CKD and HTN.  Patient returned my call from earlier today, and left me a return VM msg, and we were eventually able to successfully connect with one another.  HIPAA verified.  Today, patient reports that she is doing well but didn't sleep well last night and has a "full feeling stomach."  Patient reports that she has been monitoring and recording her blood sugars two and three times every day, and has "kind of" been keeping up with the food she eats. Encouraged patient to continue recording her blood sugars and food intake for review at [place] in-home visit when her primary [facility] RN CM returns.  Patient reports that she visited her PCP on Tuesday August 10, 2016 and the "visit went well."  Patient stated that she took an enema yesterday for her chronic constipation, and that her cousin was bringing over the glycerine suppositories today that had been ordered for her during the PCP visit. Patient also reported an upcoming PCP visit with Dr. Wendy Poet on August 26, 2016.  Patient denied further concerns, issues, or problems today, and I again confirmed with her that her primary [facility] RN CM, Pam, would contact her upon her return to work to schedule an in-home visit.  I made sure that patient had my direct contact information, the main number for [facility] CM, and the 24-hour nurse line phone number.  Patient asked that if possible, for [facility] RN CM to text her phone number [phone], or to leave a voice mail message, as her phone "has been acting up and not  ringing."  Patient also asked that I provide her primary [place] with alternate phone number of her boyfriend, Vanita Panda, (currently NOT on [facility] CM written consent) at [phone], stating that he can get a message to her.    Plan:  Anokhi will continue to take her medications as prescribed and will attend all scheduled provider appointments.  [place] will contact her providers promptly for any new concerns, questions, needs, or problems.  Dnaja will begin monitoring and recording her blood sugars three times per day.  Neriyah will begin keeping a food journal of her nutritional intake for comparison to her blood sugar levels.  [place] outreach for transition of care to continue; will update patient's primary [facility] RN CM of successful telephone outreach with patient today.   Oneta Rack, RN, BSN, [employer] [facility] Care Management  [phone]

## 2016-08-18 IMAGING — CR DG CHEST 2V
2 series · 2 of 2 positions shown · non-contrast
Comparison: 03/21/2016

CLINICAL DATA: Cough, vomiting, and chest pain for 1 day.

EXAM:
CHEST  2 VIEW

[chest pa]
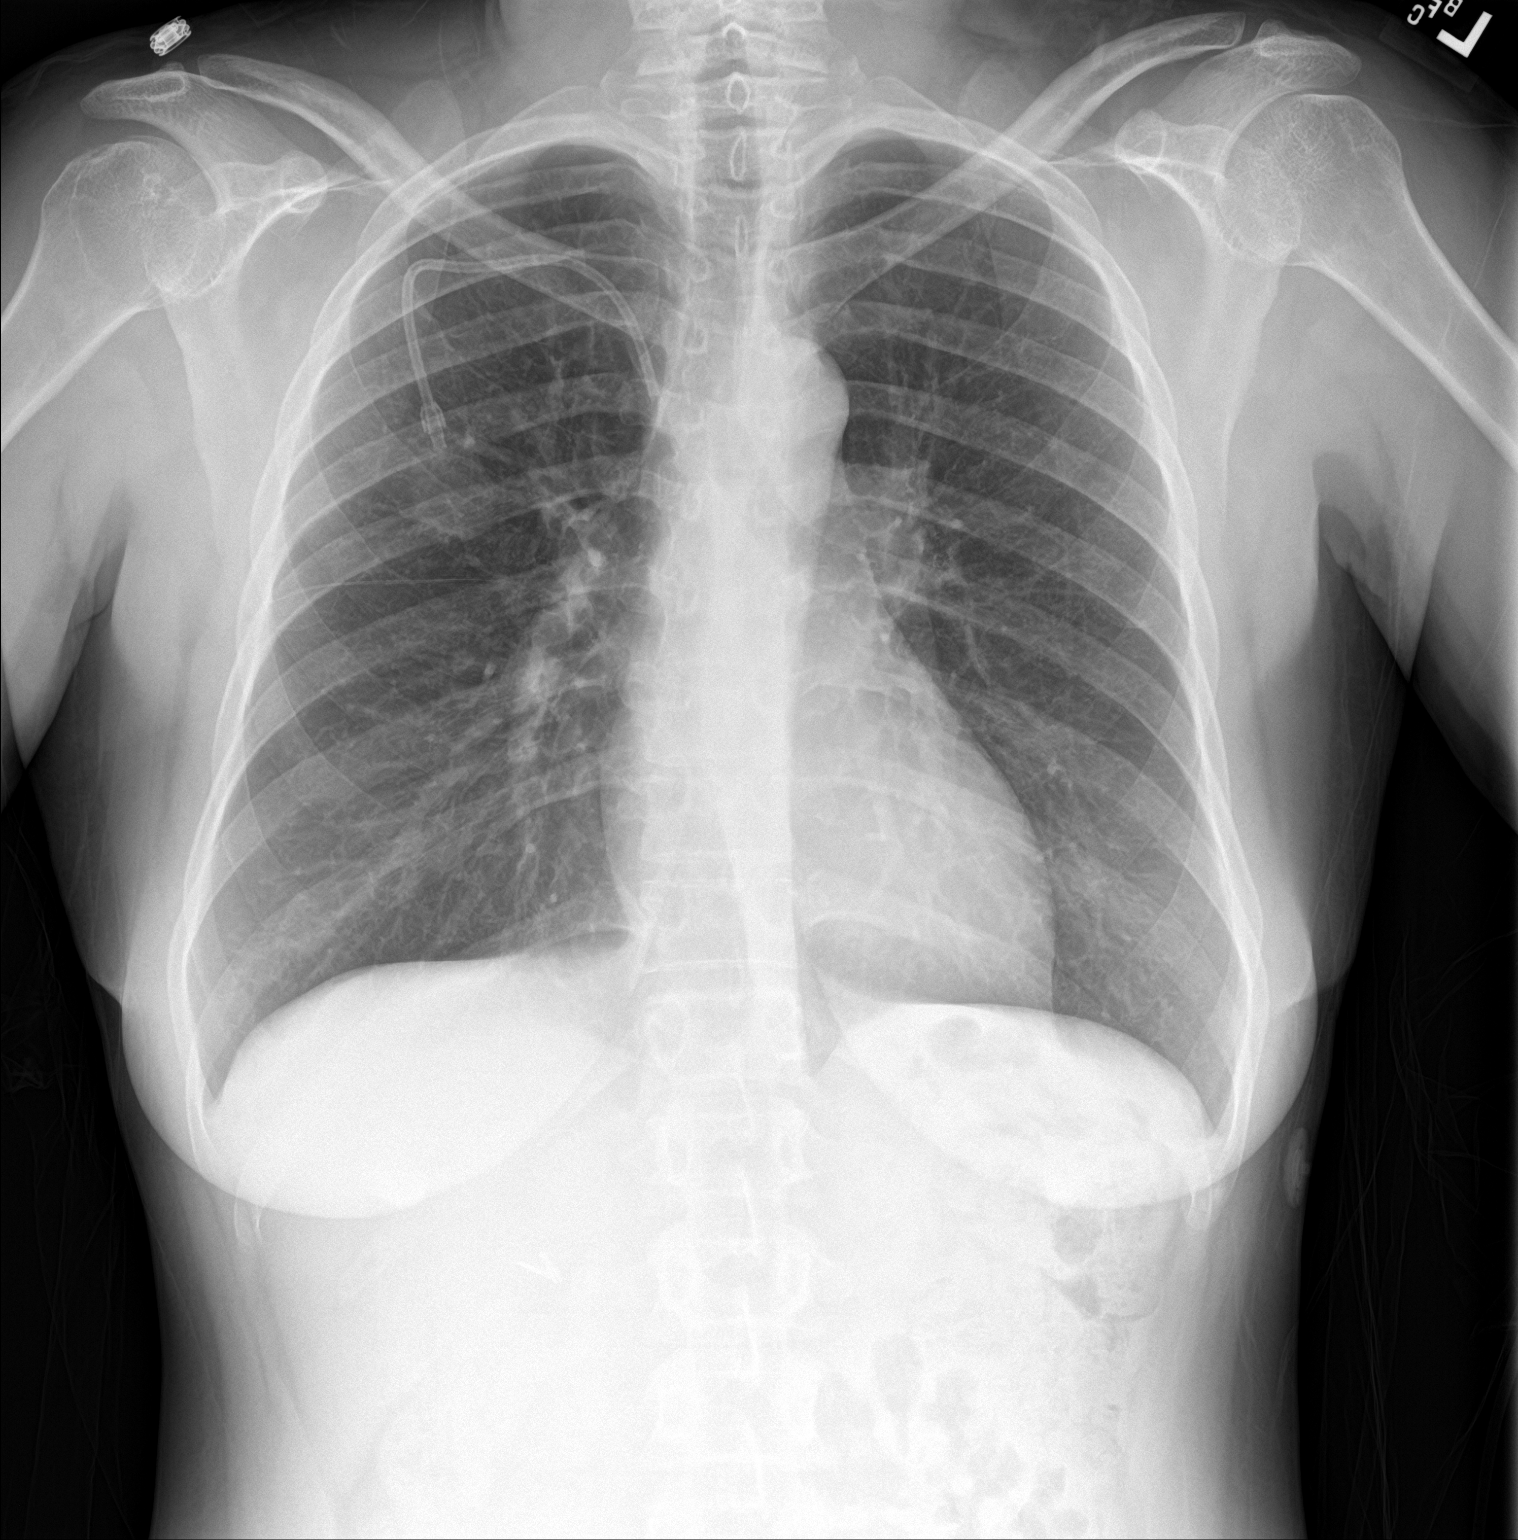

[chest lat]
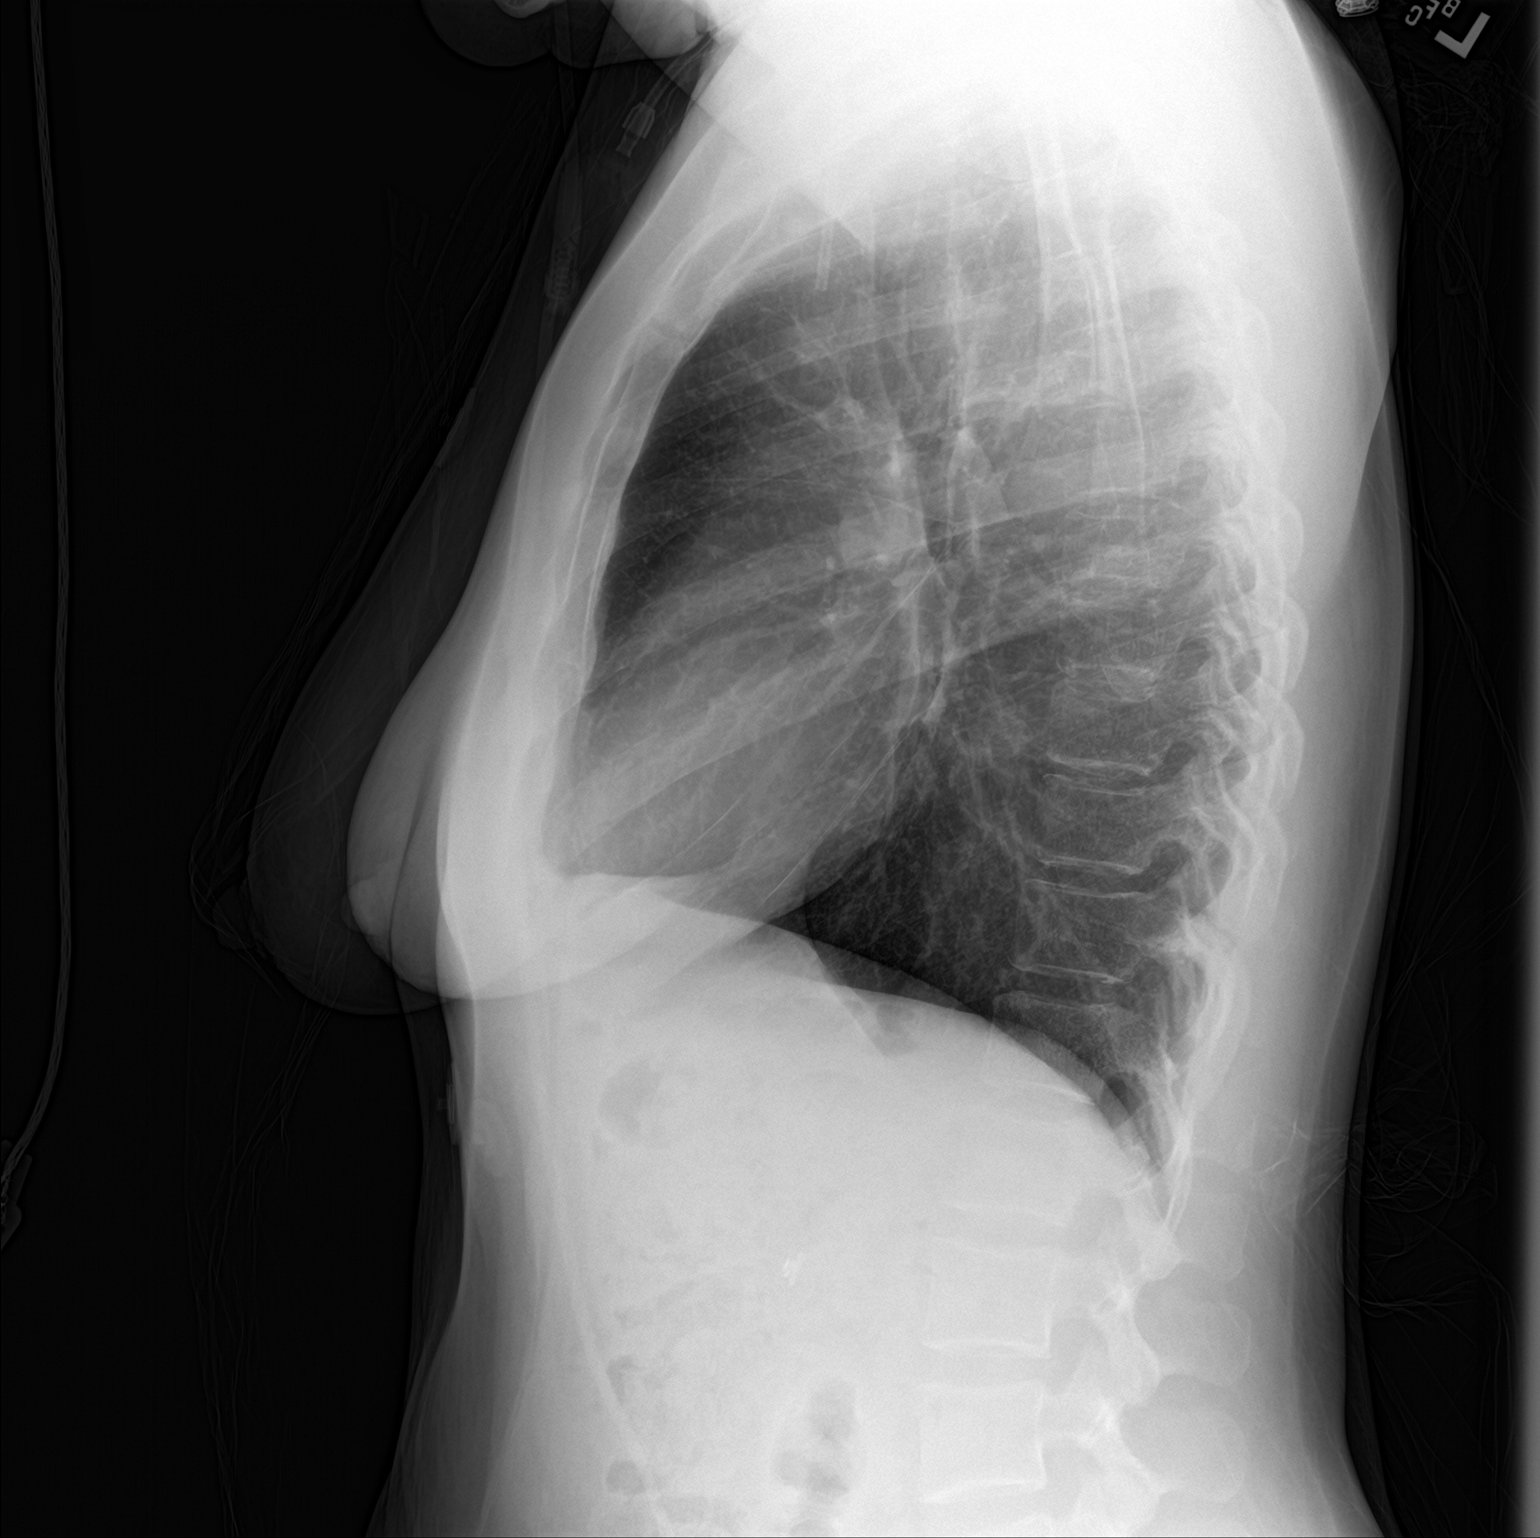

[2 of 2 positions shown; findings below may reference images not displayed]

FINDINGS: Right central venous catheter with tip over the upper SVC region.
Normal heart size and pulmonary vascularity. No focal airspace
disease or consolidation in the lungs. No blunting of costophrenic
angles. No pneumothorax. Mediastinal contours appear intact.
Surgical clips in the right upper quadrant.
IMPRESSION: No active cardiopulmonary disease.

## 2016-08-26 ENCOUNTER — Ambulatory Visit (INDEPENDENT_AMBULATORY_CARE_PROVIDER_SITE_OTHER): Payer: Medicare Other | Admitting: Family Medicine

## 2016-08-26 ENCOUNTER — Encounter: Payer: Self-pay | Admitting: Family Medicine

## 2016-08-26 VITALS — BP 136/72 | HR 74 | Temp 98.3°F | Wt 148.0 lb

## 2016-08-26 DIAGNOSIS — Z7289 Other problems related to lifestyle: Secondary | ICD-10-CM

## 2016-08-26 DIAGNOSIS — I10 Essential (primary) hypertension: Secondary | ICD-10-CM

## 2016-08-26 DIAGNOSIS — E1322 Other specified diabetes mellitus with diabetic chronic kidney disease: Secondary | ICD-10-CM | POA: Diagnosis not present

## 2016-08-26 DIAGNOSIS — E1365 Other specified diabetes mellitus with hyperglycemia: Secondary | ICD-10-CM

## 2016-08-26 DIAGNOSIS — N184 Chronic kidney disease, stage 4 (severe): Secondary | ICD-10-CM

## 2016-08-26 DIAGNOSIS — Z23 Encounter for immunization: Secondary | ICD-10-CM

## 2016-08-26 DIAGNOSIS — G43A1 Cyclical vomiting, intractable: Secondary | ICD-10-CM

## 2016-08-26 DIAGNOSIS — K3184 Gastroparesis: Secondary | ICD-10-CM

## 2016-08-26 DIAGNOSIS — Z7189 Other specified counseling: Secondary | ICD-10-CM

## 2016-08-26 DIAGNOSIS — R1115 Cyclical vomiting syndrome unrelated to migraine: Secondary | ICD-10-CM

## 2016-08-26 DIAGNOSIS — K5900 Constipation, unspecified: Secondary | ICD-10-CM

## 2016-08-26 DIAGNOSIS — N183 Chronic kidney disease, stage 3 (moderate): Secondary | ICD-10-CM

## 2016-08-26 DIAGNOSIS — G8929 Other chronic pain: Secondary | ICD-10-CM

## 2016-08-26 DIAGNOSIS — Z609 Problem related to social environment, unspecified: Secondary | ICD-10-CM

## 2016-08-26 DIAGNOSIS — K5909 Other constipation: Secondary | ICD-10-CM

## 2016-08-26 DIAGNOSIS — R319 Hematuria, unspecified: Secondary | ICD-10-CM

## 2016-08-26 DIAGNOSIS — IMO0002 Reserved for concepts with insufficient information to code with codable children: Secondary | ICD-10-CM

## 2016-08-26 DIAGNOSIS — E1143 Type 2 diabetes mellitus with diabetic autonomic (poly)neuropathy: Secondary | ICD-10-CM

## 2016-08-26 LAB — BASIC METABOLIC PANEL WITH GFR
BUN: 28 mg/dL — ABNORMAL HIGH (ref 7–25)
CHLORIDE: 108 mmol/L (ref 98–110)
CO2: 20 mmol/L (ref 20–31)
Calcium: 9.5 mg/dL (ref 8.6–10.4)
Creat: 2.26 mg/dL — ABNORMAL HIGH (ref 0.50–1.05)
GFR, EST NON AFRICAN AMERICAN: 24 mL/min — AB (ref >=60)
GFR, Est African American: 28 mL/min — ABNORMAL LOW (ref >=60)
Glucose, Bld: 219 mg/dL — ABNORMAL HIGH (ref 65–99)
POTASSIUM: 4.8 mmol/L (ref 3.5–5.3)
SODIUM: 140 mmol/L (ref 135–146)

## 2016-08-26 NOTE — Assessment & Plan Note (Signed)
Established problem that has improved.  Sionce start of Amitiza by Dr Emmaline Life, Pt is now having a bowel movement everyday without straining.   She also feels like her cycles of vomiting have decreased since starting [place]

## 2016-08-26 NOTE — Assessment & Plan Note (Signed)
Established problem Controlled Continue daily Lantus 10 units [place]. Discussed waiting until her CBGs are in 120 to 140 range at least before taking her morning Lantus. Discussed cutting down her lantus to 5 units once a day on days when she is unable to keep food down

## 2016-08-26 NOTE — Assessment & Plan Note (Signed)
Two episodes in last year of urinalysis with 6-30 rbc/hpf, 01/17 and 05/17.  S/P Hysterectomy Need to check urine next visit. Likely needs urology referral for cystoscopy arranged next ov in 3 to 4 months
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
## 2016-08-26 NOTE — Patient Instructions (Signed)
Continue the Amitiza as it seems to be helping without causing side effects.  Call if you need a refill of your Ativan or Pain medication.  We will refill the pain medication and Ativan only once a month.   Dr Garrit Marrow will call you if your tests are not good. Otherwise he will send you a letter.  If you sign up for MyChart online, you will be able to see your test results once Dr Vincent Ehrler has reviewed them.  If you do not hear from [place] with in 2 weeks please call our office

## 2016-08-26 NOTE — Assessment & Plan Note (Signed)
Established problem. Stable. Continue current therapy Checking BMET today Will need iPTH checked next visit. Not on Cholecalciferol.

## 2016-08-26 NOTE — Assessment & Plan Note (Signed)
Patient signed pain contract. NCCS registry was checked today.  No other prescribers other than [facility] prescribers in last three months.  Patient admits to smoking marijuana regularly for her chronic nausea and that it helps.  She acknowledges that the use of marijuana for either recreational or medicinal purposes is illegal.   As The treatment of her cycles of vomiting with opiates and benzos is of greater benefit than stopping these medications because of the use of an illicit substance, in my opinion, I will not screen for marijuana use by the patient when testing for opiate diversion.

## 2016-08-26 NOTE — Assessment & Plan Note (Addendum)
Adequate blood pressure control.  No evidence of new end organ damage.  Patient thought she was suppose to be taking Losartan so has been taking it since discharge from Hospital in Early August.   Checking BMET today. Continue Losartan 100 mg daily along with carvedilol  Tolerating medication without significant adverse effects.  Plan to continue current blood pressure regiment. Pt has been on Hydralazine in past with good BP control.  It was stopped during last August Hospitalization.  Will monitor if it needs to be restarted for BP control   Pt has appointment with Cardiology coming up in October for follow up of Chest pain during August hospitalization

## 2016-08-26 NOTE — Progress Notes (Signed)
   Subjective:    Patient ID: [name] [name], female    DOB: June 05, 1966, 50 y.o.   MRN: [id number]  HPI Problem List Items Addressed This Visit      Cardiovascular and Mediastinum   Essential hypertension CHRONIC HYPERTENSION  Disease Monitoring  Blood pressure range: not checking at home  Chest pain: no   Dyspnea: no   Claudication: no   Medication compliance: yes  Medication Side Effects  Lightheadedness: no   Urinary frequency: no   Edema: no        Digestive   Intermittent constipation -longstanding issue - Much improved with addition of Amitiza   Diabetic gastroparesis associated with type 2 diabetes mellitus ([place] -longstanding issue - Has had to use Ativan only once and pain med only once   Delayed gastric emptying  -longstanding issue   Cyclic vomiting syndrome -longstanding issue     Endocrine   Uncontrolled secondary diabetes mellitus with stage 3 CKD (GFR 30-59) (HCC) CHRONIC DIABETES  Disease Monitoring  Blood Sugar Ranges: in 100s to mid 200s,               Polyuria: no   Visual problems: no   Medication Compliance: yes  Medication Side Effects  Hypoglycemia: yes, self rescue, Twice symtomatic at 83 and 106.    Preventitive Health Care             Daily Aspirin: no              Statin: no             Dental evaluation in 50-month: in process of having teeth removed and fitting for dentures             Recent eGFR: 43 ml/min 07/04/16             ACEI: ARB(+)  Eye Exam: unknown  Foot Exam: unknown  Diet pattern: moderate CH2O   Exercise: no     Pure hypercholesterolemia -longstanding issue - stable    Will need to discuss statin therapy on next visit for patient with diabetes      Marijuana smoker ([place] - -longstanding issue - Has been counseled many times in past that it could make her vomit, but she insists that it helps decrease her vomiting when it occurs.    Encounter for chronic pain management    I reviewed [place] CSDB today which  showed no controlled prescriptions filled in last 3 months [facility]          SH: Smoking marajuana 1-2 times a week.          Thinking about moving to live near her dgt in [place] [facility]         Fixed income from Disability and SSI of about $800/month Review of Systems See HPI    Objective:   Physical Exam VS reviewed Gen: NAD, groomed, social Cor RRR, no m, g, r, nl pmi Diabetic Foot Exam - Simple   No data filed    Ext: no edema  Lung: BCTA, no Acc mm work Abd: soft, (+) BS, ND, NT       Assessment & Plan:

## 2016-08-26 NOTE — Assessment & Plan Note (Signed)
Established problem. Stable. Has only had to use Ativan and opiate once since hospitalization in early August She attributes improvement to start of Amitiza by Dr Emmaline Life last month for patient's constipation.

## 2016-08-26 NOTE — Assessment & Plan Note (Signed)
Established problem. Improved per patient.  Only one episode of persistent nausea and pain since start of Amitiza last month per patient.  Pt may continue to use prn Ativan and oxycodone for exacerbation of nausea attcks along with phenergan    I have known Ms Bixel for over 10 years, and manged many of her cyclic vomiting exacerbations in past with cocktail of scheduled Ativan/Opiates/Phenergan with prettygood success. I want to patient to be able to initiate this same therapy at home as soon as she experiences her warning stereotypical warning signs and symptoms with the goal of trying to terminate the exacerations at home without having to come into ED for hospital admission. To that end, I prescribed Ms Hofmeyer a limited supply of Ativan/percocet/phenergan(supp and tabs) that she is to take at first sign of an attack.   When Ms Lookabill returns from [facility] visit, will revisit the abortive therapy to access efficacy and tolerability.

## 2016-09-20 DIAGNOSIS — R809 Proteinuria, unspecified: Secondary | ICD-10-CM | POA: Diagnosis not present

## 2016-09-20 DIAGNOSIS — N183 Chronic kidney disease, stage 3 (moderate): Secondary | ICD-10-CM | POA: Diagnosis not present

## 2016-09-20 DIAGNOSIS — I10 Essential (primary) hypertension: Secondary | ICD-10-CM | POA: Diagnosis not present

## 2016-09-20 DIAGNOSIS — N2581 Secondary hyperparathyroidism of renal origin: Secondary | ICD-10-CM | POA: Diagnosis not present

## 2016-10-01 ENCOUNTER — Ambulatory Visit (INDEPENDENT_AMBULATORY_CARE_PROVIDER_SITE_OTHER): Payer: Medicare Other | Admitting: Internal Medicine

## 2016-10-01 ENCOUNTER — Other Ambulatory Visit (HOSPITAL_COMMUNITY)
Admission: RE | Admit: 2016-10-01 | Discharge: 2016-10-01 | Disposition: A | Discharge status: Home / Self Care | Payer: Medicare Other | Source: Ambulatory Visit | Attending: Family Medicine | Admitting: Family Medicine

## 2016-10-01 VITALS — BP 141/58 | HR 62 | Temp 98.3°F | Wt 144.0 lb

## 2016-10-01 DIAGNOSIS — Z113 Encounter for screening for infections with a predominantly sexual mode of transmission: Secondary | ICD-10-CM | POA: Insufficient documentation

## 2016-10-01 DIAGNOSIS — N898 Other specified noninflammatory disorders of vagina: Secondary | ICD-10-CM | POA: Diagnosis not present

## 2016-10-01 DIAGNOSIS — R300 Dysuria: Secondary | ICD-10-CM

## 2016-10-01 LAB — POCT URINALYSIS DIPSTICK
Bilirubin, UA: NEGATIVE
Glucose, UA: NEGATIVE
Ketones, UA: NEGATIVE
Nitrite, UA: NEGATIVE
PH UA: 6.5
Protein, UA: 30
SPEC GRAV UA: 1.01
UROBILINOGEN UA: 0.2

## 2016-10-01 LAB — POCT WET PREP (WET MOUNT)
Clue Cells Wet Prep Whiff POC: POSITIVE
Trichomonas Wet Prep HPF POC: ABSENT

## 2016-10-01 LAB — POCT UA - MICROSCOPIC ONLY

## 2016-10-01 MED ORDER — METRONIDAZOLE 0.75 % VA GEL: 1.0000 | Freq: Every day | VAGINAL | 0 refills | Status: DC

## 2016-10-01 NOTE — Assessment & Plan Note (Addendum)
Vaginal discharge present in the setting of possible STD exposure. Wet prep performed and positive for BV.  -GC/Chlamydia collected  -MetroGel x5 nights prescribed (opted not to treat with PO flagyl given history of cyclical vomiting)  -stick with Dial soap that does not irritate her

## 2016-10-01 NOTE — Progress Notes (Signed)
Subjective:    [name] [name] - 50 y.o. female MRN [id number]  Date of birth: 13-Feb-1966  HPI  [name] [name] is here for [facility] for urinary complaints.  Pain urinating started 4 days ago. Pain is: located in suprapubic region. Has pressure.  Medications tried: None  Any antibiotics in the last 30 days: No  More than 3 UTIs in the last 12 months: No  STD exposure: Has been with same partner for 14 years. But patient is "curious" about having STDs because patient has gotten STD from partner before. Partner recently took antibiotics for reported bronchitis. Patient concerned that partner was taking abx for STD.  Possibly pregnant: S/p hysterectomy  Patient is diabetic. CBGs have been up and down. 175-215 fasting recently but has also had some episodes of hypoglycemia down to 60-70 in AM.  Patient also reports irritation to different soaps. Dial is the only soap that does not irritate her but she has been trying different fragrance soaps recently.   Symptoms Urgency: Yes.  Frequency: Yes, especially at night.  Blood in urine: No  Pain in back:Yes.  Fever: No but chills for 2 days.  Vaginal discharge: Yes. White, thick.  Mouth Ulcers: No   Review of Symptoms - see HPI   -  reports that she has been smoking Cigarettes.  She started smoking about 33 years ago. She has a 11.00 pack-year smoking history. She has never used smokeless tobacco.  - Past Medical History: Patient Active Problem List   Diagnosis Date Noted  . Dysuria 10/01/2016  . Recurrent major depressive disorder ([place])   . DKA (diabetic ketoacidoses) ([place]) 08/03/2016  . Uncontrollable vomiting 07/02/2016  . Essential hypertension 05/01/2016  . Diabetic gastroparesis associated with type 2 diabetes mellitus ([place]) 05/01/2016  . Pain in the chest   . Abdominal pain, generalized   . Normocytic anemia   . Hematuria 07/09/2015  . Intermittent constipation 06/06/2015  . Encounter for chronic pain management 01/06/2015  .  Vitamin D deficiency 11/28/2014  . Vaginal discharge 08/15/2014  . Pure hypercholesterolemia 08/15/2014  . Chronic leg cramping 07/18/2014  . Chronic kidney disease (CKD), stage IV (severe) ([place]) 08/17/2013  . Nausea and vomiting in adult 06/06/2013  . High risk social situation 08/31/2012  . Allergic rhinitis, seasonal 05/04/2012  . Marijuana smoker ([place]) 11/03/2011  . Uncontrolled secondary diabetes mellitus with stage 3 CKD (GFR 30-59) ([place]) 10/23/2011  . Cyclic vomiting syndrome 08/03/2011  . Delayed gastric emptying 05/12/2011  . Chronic back pain 04/10/2007  . Depression with anxiety 02/23/2007  . TOBACCO DEPENDENCE 02/23/2007  . DM neuropathies ([place]) 02/23/2007  . GERD 01/27/2004   - Medications: reviewed and updated    Objective:   Physical Exam BP (!) 141/58 (BP Location: Left Arm, Patient Position: Sitting, Cuff Size: Normal)   Pulse 62   Temp 98.3 F (36.8 C) (Oral)   Wt 144 lb (65.3 kg)   SpO2 100%   BMI 27.21 kg/m  Gen: NAD, alert, cooperative with exam, well-appearing Abd: SNTND, BS present, no guarding or organomegaly, no CVA tenderness GU/GYN: Exam performed in the presence of a chaperone. External genitalia within normal limits.  Vaginal mucosa pink, moist, normal rugae. White, tacky odorous vaginal discharge present.     Assessment & Plan:   Dysuria Patient with positive BV although this likely does not explain her other symptoms. All prior urine cultures without significant growth; therefore no prior sensitivities available. Based on UA results, less suspicious for UTI although will  send for culture. Although symptoms of urgency and frequency may be related to her T2DM, she does not have glucose or ketones in her urine concerning for an acute diabetic process as cause of symptoms.  -hold off on abx pending urine culture results  -GC/Chlamydia results pending  -f/u with PCP regarding T2DM  -return precautions given   Vaginal discharge Vaginal discharge  present in the setting of possible STD exposure. Wet prep performed and positive for BV.  -GC/Chlamydia collected  -MetroGel x5 nights prescribed (opted not to treat with PO flagyl given history of cyclical vomiting)  -stick with Dial soap that does not irritate her      Phill Myron, D.O. 10/01/2016, 11:04 AM PGY-2, [place]

## 2016-10-01 NOTE — Patient Instructions (Addendum)
Your wet prep showed that you have bacterial vaginosis, a benign overgrowth of normal bacteria, which is likely the cause of your vaginal discharge. I have sent a vaginal gel to your pharmacy to treat this.   We will call you with the results of your other tests from today.   Please make a follow up appointment with your PCP to discuss your diabetes and blood sugar control.   Take Care,   Dr. Juleen China

## 2016-10-01 NOTE — Assessment & Plan Note (Addendum)
Patient with positive BV although this likely does not explain her other symptoms. All prior urine cultures without significant growth; therefore no prior sensitivities available. Based on UA results, less suspicious for UTI although will send for culture. Although symptoms of urgency and frequency may be related to her T2DM, she does not have glucose or ketones in her urine concerning for an acute diabetic process as cause of symptoms.  -hold off on abx pending urine culture results  -GC/Chlamydia results pending  -f/u with PCP regarding T2DM  -return precautions given

## 2016-10-03 LAB — URINE CULTURE

## 2016-10-04 ENCOUNTER — Other Ambulatory Visit: Payer: Self-pay

## 2016-10-04 LAB — CERVICOVAGINAL ANCILLARY ONLY
CHLAMYDIA, DNA PROBE: NEGATIVE
NEISSERIA GONORRHEA: NEGATIVE

## 2016-10-04 NOTE — Addendum Note (Signed)
Addended by: Tobi Bastos on: 10/04/2016 05:27 PM   Modules accepted: Miquel Dunn

## 2016-10-04 NOTE — Telephone Encounter (Signed)
This encounter was created in error - please disregard.

## 2016-10-05 ENCOUNTER — Encounter: Payer: Self-pay | Admitting: Family Medicine

## 2016-10-05 ENCOUNTER — Ambulatory Visit (INDEPENDENT_AMBULATORY_CARE_PROVIDER_SITE_OTHER): Payer: Medicare Other | Admitting: Cardiovascular Disease

## 2016-10-05 ENCOUNTER — Encounter: Payer: Self-pay | Admitting: Cardiovascular Disease

## 2016-10-05 DIAGNOSIS — F17200 Nicotine dependence, unspecified, uncomplicated: Secondary | ICD-10-CM | POA: Diagnosis not present

## 2016-10-05 DIAGNOSIS — I10 Essential (primary) hypertension: Secondary | ICD-10-CM | POA: Diagnosis not present

## 2016-10-05 DIAGNOSIS — N2581 Secondary hyperparathyroidism of renal origin: Secondary | ICD-10-CM

## 2016-10-05 DIAGNOSIS — I208 Other forms of angina pectoris: Secondary | ICD-10-CM | POA: Diagnosis not present

## 2016-10-05 NOTE — Assessment & Plan Note (Signed)
History of tobacco abuse currently smoking one third of a pack a day for the last 35 years. She is recalcitrant to risk factor modification.

## 2016-10-05 NOTE — Progress Notes (Signed)
Office Consultation with Pearson Grippe, MD on 09/20/16 at [place] 1. CKD Stage III - Stable GFR.  Presumed volume shifts cause fluctuations in GFR - Due to DM and HTN - Follow up in 6 months  2. Secondary Hyperparathyroidism - Not on Vit D replacement therapy 3. Microalbuminuria 4. Hypertension - Continue Coreg 6.25 mg bid - Start Losartan 25 mg daily 5. Mild Anemia

## 2016-10-05 NOTE — Assessment & Plan Note (Signed)
History of hypertension with blood pressure measured today at 96/50. She is on losartan and carvedilol. Continue current meds at current dosing

## 2016-10-05 NOTE — Patient Instructions (Signed)
Medication Instructions:  NO CHANGES.   Follow-Up: We request that you follow-up in: 6 MONTHS with an extender and in 12 MONTHS with Dr Andria Rhein will receive a reminder letter in the mail two months in advance. If you don't receive a letter, please call our office to schedule the follow-up appointment.  If you need a refill on your cardiac medications before your next appointment, please call your pharmacy.

## 2016-10-05 NOTE — Assessment & Plan Note (Signed)
The patient was recently hospitalized briefly for DKA, vomiting and atypical chest pain. Her troponins were mildly elevated and flat. She had no acute EKG changes. She does get occasional atypical chest pain occurring every other month. 2-D echo recently performed in May showed normal LV systolic function. Her pain does not sound ischemic we mediated I do not feel compelled to perform functional testing at this time.

## 2016-10-05 NOTE — Progress Notes (Signed)
10/05/2016 [name] [name]   March 05, 1966  [id number]  Primary Physician MCDIARMID,TODD D, MD Primary Cardiologist: Lorretta Harp MD Renae Gloss  HPI:  [name] [name] is a 50 year old mildly overweight single African-American female mother of 4 children, grandmother of 77 grandchildren who is currently disabled because of vomiting. She was referred by ecchymosis, hospital emergency room because of atypical chest pain. Risk factors include continued tobacco abuse of one third pack per day having smoked approximately 10-15 pack years. She has history of hypertension, diabetes and untreated hyperlipidemia. Her mother did die of myocardial infarction age 7. She had cardiac catheterization performed 10 years ago that showed normal coronary arteries. She gets occasional atypical chest pain occurring every other month or so which is brief, sharp and nonischemic.   Current Outpatient Prescriptions  Medication Sig Dispense Refill  . carvedilol (COREG) 6.25 MG tablet Take 1 tablet (6.25 mg total) by mouth 2 (two) times daily with a meal. 60 tablet 0  . insulin glargine (LANTUS) 100 UNIT/ML injection Inject 0.1 mLs (10 Units total) into the skin at bedtime. 10 mL 0  . LORazepam (ATIVAN) 0.5 MG tablet Take 1 tablet (0.5 mg total) by mouth 2 (two) times daily as needed for anxiety. 15 tablet 0  . losartan (COZAAR) 100 MG tablet     . lubiprostone (AMITIZA) 8 MCG capsule Take 1 capsule (8 mcg total) by mouth 2 (two) times daily with a meal. 60 capsule 0  . metroNIDAZOLE (METROGEL) 0.75 % vaginal gel Place 1 Applicatorful vaginally at bedtime. For five consecutive nights 70 g 0  . oxyCODONE (OXY IR/ROXICODONE) 5 MG immediate release tablet Take 1 tablet (5 mg total) by mouth every 4 (four) hours as needed for severe pain. 30 tablet 0  . pregabalin (LYRICA) 50 MG capsule TAKE 1 TABLET BY MOUTH AT BEDTIME AS NEEDED 30 capsule 3  . promethazine (PHENERGAN) 12.5 MG tablet Take 1 tablet (12.5 mg  total) by mouth every 6 (six) hours as needed for nausea or vomiting. 40 tablet 0   No current facility-administered medications for this visit.     Allergies  Allergen Reactions  . Bee Venom Anaphylaxis    Required hospital visit to ER  . Tegaderm Ag Mesh [Silver] Dermatitis    And [place] tape- causes blisters and itching  . Acetaminophen Nausea And Vomiting  . Novolog [Insulin Aspart] Other (See Comments)    Muscles in feet cramp  . Ceftriaxone Other (See Comments)    Caused an ulcer in her mouth  . Erythromycin Nausea And Vomiting    Social History   Social History  . Marital status: Single    Spouse name: N/A  . Number of children: N/A  . Years of education: N/A   Occupational History  . Not on file.   Social History Main Topics  . Smoking status: Current Some Day Smoker    Packs/day: 0.50    Years: 22.00    Types: Cigarettes    Start date: 12/27/1982  . Smokeless tobacco: Never Used     Comment: THC 1-2 times per day almost daily  . Alcohol use No  . Drug use:     Types: Marijuana     Comment: Current Daily to weekly use  . Sexual activity: Yes    Birth control/ protection: None   Other Topics Concern  . Not on file   Social History Narrative   Lives in [place] - some minimal family support  Has 10 grandchildren   Cares for her Dog [name] (Mixed female [place] and Cockerspaniel)      Review of Systems: General: negative for chills, fever, night sweats or weight changes.  Cardiovascular: negative for chest pain, dyspnea on exertion, edema, orthopnea, palpitations, paroxysmal nocturnal dyspnea or shortness of breath Dermatological: negative for rash Respiratory: negative for cough or wheezing Urologic: negative for hematuria Abdominal: negative for nausea, vomiting, diarrhea, bright red blood per rectum, melena, or hematemesis Neurologic: negative for visual changes, syncope, or dizziness All other systems reviewed and are otherwise negative  except as noted above.    Blood pressure (!) 96/50, pulse 83, height 5\' 1"  (1.549 m), weight 138 lb (62.6 kg).  General appearance: alert and no distress Neck: no adenopathy, no carotid bruit, no JVD, supple, symmetrical, trachea midline and thyroid not enlarged, symmetric, no tenderness/mass/nodules Lungs: clear to auscultation bilaterally Heart: regular rate and rhythm, S1, S2 normal, no murmur, click, rub or gallop Extremities: extremities normal, atraumatic, no cyanosis or edema  EKG not performed today  ASSESSMENT AND PLAN:   TOBACCO DEPENDENCE History of tobacco abuse currently smoking one third of a pack a day for the last 35 years. She is recalcitrant to risk factor modification.  Essential hypertension History of hypertension with blood pressure measured today at 96/50. She is on losartan and carvedilol. Continue current meds at current dosing  Pain in the chest The patient was recently hospitalized briefly for DKA, vomiting and atypical chest pain. Her troponins were mildly elevated and flat. She had no acute EKG changes. She does get occasional atypical chest pain occurring every other month. 2-D echo recently performed in May showed normal LV systolic function. Her pain does not sound ischemic we mediated I do not feel compelled to perform functional testing at this time.      Lorretta Harp MD FACP,FACC,FAHA, [facility] 10/05/2016 11:08 AM

## 2016-10-07 ENCOUNTER — Telehealth: Payer: Self-pay | Admitting: Internal Medicine

## 2016-10-07 MED ORDER — CIPROFLOXACIN HCL 250 MG PO TABS: 250.0000 mg | ORAL_TABLET | Freq: Two times a day (BID) | ORAL | 0 refills | Status: DC

## 2016-10-07 NOTE — Telephone Encounter (Signed)
Attempted to call patient with results but line was busy.   Please call patient again with the following information:  -Gonorrhea/Chlamydia negative  -Her urine culture grew E coli but not in an amount normally seen with a urinary tract infection. If she is still having symptoms it is reasonable to try a short course of antibiotics. I have sent Ciprofloxacin to her pharmacy as this was sensitive to the E coli. She should take this twice daily for 3 days. If she is not having symptoms she does not need to pick this medication up.  She should follow up with her PCP if symptoms persist.   Phill Myron, D.O. 10/07/2016, 2:48 PM PGY-2, [place]

## 2016-10-08 NOTE — Telephone Encounter (Signed)
Tried to contact pt and unable to LVM due to phone only ringing and then just stopped. If pt calls back please inform her of below. Katharina Caper, [name], [place]

## 2016-10-15 ENCOUNTER — Telehealth: Payer: Self-pay | Admitting: Family Medicine

## 2016-10-15 NOTE — Telephone Encounter (Signed)
Contacted pt about below.  Told her that the doctor had tried her and phone was pusy and that I had tried but not able to LVM due to phone just ringing and she said that she had run out of minutes and that she told them that they could text her and I told her that we are not able to text from our office phones. Pt stated that she had the Rx and that she found out when she went to the pharmacy.Katharina Caper, Arsenia Goracke D, [place]

## 2016-10-15 NOTE — Telephone Encounter (Signed)
Pt has been prescribed an antibiotic and does not know why. Pt is angry and upset that no one called her. In previous notes it shows April tried to get in touch. Pt would like someone to call her ASAP. Please advise. Thanks! ep

## 2016-11-18 ENCOUNTER — Emergency Department (HOSPITAL_COMMUNITY): Payer: Medicare Other

## 2016-11-18 ENCOUNTER — Emergency Department (HOSPITAL_COMMUNITY)
Admission: EM | Admit: 2016-11-18 | Discharge: 2016-11-18 | Disposition: A | Discharge status: Home / Self Care | Payer: Medicare Other | Attending: Emergency Medicine | Admitting: Emergency Medicine

## 2016-11-18 ENCOUNTER — Encounter (HOSPITAL_COMMUNITY): Payer: Self-pay | Admitting: Emergency Medicine

## 2016-11-18 DIAGNOSIS — N183 Chronic kidney disease, stage 3 (moderate): Secondary | ICD-10-CM | POA: Insufficient documentation

## 2016-11-18 DIAGNOSIS — R079 Chest pain, unspecified: Secondary | ICD-10-CM | POA: Diagnosis not present

## 2016-11-18 DIAGNOSIS — R1110 Vomiting, unspecified: Secondary | ICD-10-CM | POA: Diagnosis present

## 2016-11-18 DIAGNOSIS — F17210 Nicotine dependence, cigarettes, uncomplicated: Secondary | ICD-10-CM | POA: Diagnosis not present

## 2016-11-18 DIAGNOSIS — Z794 Long term (current) use of insulin: Secondary | ICD-10-CM | POA: Insufficient documentation

## 2016-11-18 DIAGNOSIS — R112 Nausea with vomiting, unspecified: Secondary | ICD-10-CM

## 2016-11-18 DIAGNOSIS — I129 Hypertensive chronic kidney disease with stage 1 through stage 4 chronic kidney disease, or unspecified chronic kidney disease: Secondary | ICD-10-CM | POA: Diagnosis not present

## 2016-11-18 DIAGNOSIS — E1140 Type 2 diabetes mellitus with diabetic neuropathy, unspecified: Secondary | ICD-10-CM | POA: Diagnosis not present

## 2016-11-18 DIAGNOSIS — E1122 Type 2 diabetes mellitus with diabetic chronic kidney disease: Secondary | ICD-10-CM | POA: Insufficient documentation

## 2016-11-18 DIAGNOSIS — R109 Unspecified abdominal pain: Secondary | ICD-10-CM | POA: Insufficient documentation

## 2016-11-18 DIAGNOSIS — G8929 Other chronic pain: Secondary | ICD-10-CM | POA: Diagnosis not present

## 2016-11-18 DIAGNOSIS — I252 Old myocardial infarction: Secondary | ICD-10-CM | POA: Diagnosis not present

## 2016-11-18 LAB — CBC
HEMATOCRIT: 36.4 % (ref 36.0–46.0)
Hemoglobin: 13.2 g/dL (ref 12.0–15.0)
MCH: 31.3 pg (ref 26.0–34.0)
MCHC: 36.3 g/dL — AB (ref 30.0–36.0)
MCV: 86.3 fL (ref 78.0–100.0)
Platelets: 261 10*3/uL (ref 150–400)
RBC: 4.22 MIL/uL (ref 3.87–5.11)
RDW: 14.5 % (ref 11.5–15.5)
WBC: 15.3 10*3/uL — ABNORMAL HIGH (ref 4.0–10.5)

## 2016-11-18 LAB — COMPREHENSIVE METABOLIC PANEL
ALBUMIN: 5.2 g/dL — AB (ref 3.5–5.0)
ALK PHOS: 79 U/L (ref 38–126)
ALT: 17 U/L (ref 14–54)
AST: 29 U/L (ref 15–41)
Anion gap: 19 — ABNORMAL HIGH (ref 5–15)
BILIRUBIN TOTAL: 1 mg/dL (ref 0.3–1.2)
BUN: 46 mg/dL — AB (ref 6–20)
CALCIUM: 10.3 mg/dL (ref 8.9–10.3)
CO2: 21 mmol/L — ABNORMAL LOW (ref 22–32)
CREATININE: 3.9 mg/dL — AB (ref 0.44–1.00)
Chloride: 95 mmol/L — ABNORMAL LOW (ref 101–111)
GFR calc Af Amer: 14 mL/min — ABNORMAL LOW (ref >60)
GFR calc non Af Amer: 12 mL/min — ABNORMAL LOW (ref >60)
GLUCOSE: 272 mg/dL — AB (ref 65–99)
Potassium: 3.3 mmol/L — ABNORMAL LOW (ref 3.5–5.1)
Sodium: 135 mmol/L (ref 135–145)
TOTAL PROTEIN: 8.5 g/dL — AB (ref 6.5–8.1)

## 2016-11-18 LAB — LIPASE, BLOOD: Lipase: 91 U/L — ABNORMAL HIGH (ref 11–51)

## 2016-11-18 LAB — I-STAT TROPONIN, ED: TROPONIN I, POC: 0.05 ng/mL (ref 0.00–0.08)

## 2016-11-18 LAB — I-STAT CG4 LACTIC ACID, ED: Lactic Acid, Venous: 1.46 mmol/L (ref 0.5–1.9)

## 2016-11-18 MED ORDER — FAMOTIDINE IN NACL 20-0.9 MG/50ML-% IV SOLN
20.0000 mg | Freq: Once | INTRAVENOUS | Status: AC
  Administered 2016-11-18: 20 mg via INTRAVENOUS
  Filled 2016-11-18: qty 50

## 2016-11-18 MED ORDER — ONDANSETRON HCL 4 MG/2ML IJ SOLN
4.0000 mg | Freq: Once | INTRAMUSCULAR | Status: AC
  Administered 2016-11-18: 4 mg via INTRAVENOUS
  Filled 2016-11-18: qty 2

## 2016-11-18 MED ORDER — ONDANSETRON 4 MG PO TBDP: 4.0000 mg | ORAL_TABLET | Freq: Once | ORAL | Status: DC | PRN

## 2016-11-18 MED ORDER — ONDANSETRON 4 MG PO TBDP
ORAL_TABLET | ORAL | Status: AC
  Filled 2016-11-18: qty 1

## 2016-11-18 MED ORDER — HALOPERIDOL LACTATE 5 MG/ML IJ SOLN
5.0000 mg | Freq: Once | INTRAMUSCULAR | Status: AC
  Administered 2016-11-18: 5 mg via INTRAVENOUS
  Filled 2016-11-18: qty 1

## 2016-11-18 MED ORDER — SODIUM CHLORIDE 0.9 % IV BOLUS (SEPSIS)
2000.0000 mL | Freq: Once | INTRAVENOUS | Status: AC
  Administered 2016-11-18: 2000 mL via INTRAVENOUS

## 2016-11-18 MED ORDER — DIPHENHYDRAMINE HCL 50 MG/ML IJ SOLN
50.0000 mg | Freq: Once | INTRAMUSCULAR | Status: AC
  Administered 2016-11-18: 50 mg via INTRAVENOUS
  Filled 2016-11-18: qty 1

## 2016-11-18 MED ORDER — METOCLOPRAMIDE HCL 5 MG/ML IJ SOLN
10.0000 mg | Freq: Once | INTRAMUSCULAR | Status: AC
  Administered 2016-11-18: 10 mg via INTRAVENOUS
  Filled 2016-11-18: qty 2

## 2016-11-18 NOTE — ED Provider Notes (Signed)
[facility] DEPT Provider Note   CSN: [id number] Arrival date & time: 11/18/16  3500     History   Chief Complaint Chief Complaint  Patient presents with  . Emesis  . Chest Pain    HPI [name] [name] is a 50 y.o. female PMH of chronic abdominal pain, cyclic vomiting syndrome, presenting today with the same.  She states she has worsening abdominal pain and vomiting for the past 4 days.  She has not tried anything at home for relief.  She denies fevers or diarrhea and states this feels like her same chronic symptoms occurring again.  She requests dilaudid, ativan, and phenergan to improve her symptoms.  She requests to have her port accessed.  She denies any urinary or vaginal complaints.  No sick contacts.  Nothing makes her symptoms worse, her pain is always there.  There are no further complaints.  10 Systems reviewed and are negative for acute change except as noted in the HPI.   HPI  Past Medical History:  Diagnosis Date  . Anemia   . Anxiety   . At risk for polypharmacy 08/15/2014  . Biceps tendinopathy of right upper extremity 04/05/2014  . Blood transfusion   . Chronic generalized abdominal pain   . Chronic generalized abdominal pain   . Chronic kidney disease (CKD), stage III (moderate)   . Chronic leg cramping 07/18/2014  . CKD (chronic kidney disease) 08/17/2013   Seen by Dr Joelyn Oms 07/28/15 ([place]) - (see scanned document): 20 years diabetes, 15 years HTN, stable CKD stage III with proteinuria.  - CKD, discussed NSAID avoidance, diabetes control with target A1c<7.5%, and HTN control with SBP<140. F/u 6 months and repeat renal panel at that time. - Microalbuminuria: On lisinopril 40mg , suspected secondary due to diabetic nephropathy. - Chronic back pain: No NSDAIDs. - Say amlodipine causes nausea  - Advised not to ACE-i during vomiting episodes    . Complication of anesthesia    "problems waking up"  . Cyclic vomiting syndrome   . Delayed gastric  emptying 05/12/2011    04/12/11 NUCLEAR MEDICINE GASTRIC EMPTYING SCAN Radiopharmaceutical: 2.0 mCi Tc-98m sulfur colloid. Comparison: None. Findings: At 1 hour, 81% of the counts remain in the stomach. At 2 hours, 53% remain. Normal is less than 30% at 2 hours. IMPRESSION: Moderate delay in gastric emptying.      . Depression   . Depression with anxiety 02/23/2007   See [facility] Note dated 05/06/2016.  IC Gwenlyn Saran) was called in and I did a brief assessment.   . Diabetic gastroparesis ([place])    /e-chart  . Diabetic ketoacidosis without coma associated with type 2 diabetes mellitus ([place])   . DKA (diabetic ketoacidoses) ([place]) 08/03/2016  . Dysuria 10/01/2016  . Elevated cholesterol 11/28/2015  . Emotional stress 12/30/2015   Stress after Breakup with partnes of 13 years on 12/27/2015   . Encounter for chronic pain management 01/06/2015   Indication for chronic opioid: Chronic back pain, cyclic vomiting Medication and dose: oxycodone 5mg   # pills per month: 15 Last UDS date: 05/08/2015 - result pending Pain contract signed (Y/N): Yes, reviewed 01/06/2015  Date narcotic database last reviewed (include red flags): 05/08/2015 (no red flags).    . Esophagitis 04/18/2013   Documented by Dr. Henrene Pastor EGD 03/2011  He also consulted on her during hospitalization [id number] for cyclical vomiting. No repeat EGD necessary, treat esophagitis with PPI.    Marland Kitchen Essential hypertension 05/01/2016  . Essential hypertension, malignant 02/23/2007   Jan  2015 - stopped recently-started norvasc due to reports of GI symptoms. 02/2015 - holding lisinopril, adding hydralazine 10mg  TID, and will take 1/2 tab lisinopril when feeling better when not in cyclic vomiting flare   . Essential hypertension, malignant 02/23/2007   Jan 2015 - stopped recently-started norvasc due to reports of GI symptoms. 02/2015 - holding lisinopril, adding hydralazine 10mg  TID, and will take 1/2 tab lisinopril when feeling better when not in cyclic vomiting flare   .  GERD (gastroesophageal reflux disease)   . Heart murmur   . Hematuria 07/09/2015  . High risk social situation 08/31/2012  . Hot flashes 07/29/2011  . Hypertension   . INSOMNIA NOS 02/23/2007   Qualifier: Diagnosis of  By: Beryle Lathe    . Intermittent constipation 06/06/2015  . Intestinal impaction ([place])   . Intractable vomiting 07/02/2016  . Marijuana smoker, continuous ([place])   . MIGRAINE, UNSPEC., W/O INTRACTABLE MIGRAINE 02/23/2007   Qualifier: Diagnosis of  By: Beryle Lathe    . Myocardial infarction 07/2007   "they say I've had a silent one; I don't know"  . Nausea & vomiting 06/10/2015  . NEUROPATHY, DIABETIC 02/23/2007  . Normocytic anemia   . Onychomycosis 03/25/2015  . PANCREATITIS 05/09/2008  . Pneumonia   . Protein-calorie malnutrition, severe ([place]) 12/17/2013  . Pure hypercholesterolemia 08/15/2014   Based on her choleserol, BP, smoker status, and that she is a diabetic, her 10 year ASCVD risk is 9.9% putting her in a category of risk that would benefit from high-intensity statin.    . Rotator cuff syndrome 04/19/2014  . Smoker   . TOBACCO DEPENDENCE 02/23/2007   2 cigarettes every few days 11/2012 3 cigarettes daily    . Type II diabetes mellitus ([place])   . Uncontrolled secondary diabetes mellitus with stage 3 CKD (GFR 30-59) ([facility]) 10/23/2011   Nephropathy, gastroparesis  HgbA1c (06/2011) 7.6, 9.5, 12.2, 8.2, 10.9 (07/2012), 9.2 (02/11/2013), 7.7 (09/27/13)  Metformin: she has a history of cyclical vomiting; she would prefer not to take this medication  On lanuts 20U qam with improved control. 09/27/13   . Vitamin D deficiency 11/28/2014    Patient Active Problem List   Diagnosis Date Noted  . Secondary hyperparathyroidism of renal origin ([place]) 10/05/2016  . Recurrent major depressive disorder ([place])   . Uncontrollable vomiting 07/02/2016  . Essential hypertension 05/01/2016  . Diabetic gastroparesis associated with type 2 diabetes mellitus ([place]) 05/01/2016  . Pain in the  chest   . Abdominal pain, generalized   . Normocytic anemia   . Hematuria 07/09/2015  . Intermittent constipation 06/06/2015  . Encounter for chronic pain management 01/06/2015  . Vitamin D deficiency 11/28/2014  . Vaginal discharge 08/15/2014  . Pure hypercholesterolemia 08/15/2014  . Chronic leg cramping 07/18/2014  . Chronic kidney disease, stage III (moderate) 08/17/2013  . Nausea and vomiting in adult 06/06/2013  . High risk social situation 08/31/2012  . Allergic rhinitis, seasonal 05/04/2012  . Marijuana smoker ([place]) 11/03/2011  . Uncontrolled secondary diabetes mellitus with stage 3 CKD (GFR 30-59) ([place]) 10/23/2011  . Cyclic vomiting syndrome 08/03/2011  . Delayed gastric emptying 05/12/2011  . Chronic back pain 04/10/2007  . Depression with anxiety 02/23/2007  . TOBACCO DEPENDENCE 02/23/2007  . DM neuropathies ([place]) 02/23/2007  . GERD 01/27/2004    Past Surgical History:  Procedure Laterality Date  . ABDOMINAL HYSTERECTOMY  02/2002  . CARDIAC CATHETERIZATION    . CATARACT EXTRACTION W/ INTRAOCULAR LENS  IMPLANT, BILATERAL    . CESAREAN SECTION    .  CHOLECYSTECTOMY  1980's  . COLONOSCOPY    . DILATION AND EVACUATION  X3  . laparotomy and lysis of adhesions    . left hand surgery    . PORT-A-CATH placement  ~ 2008   right chest; "poor access; frequent hospitalizations"  . UPPER GI ENDOSCOPY      OB History    Gravida Para Term Preterm AB Living   7       3 4    SAB TAB Ectopic Multiple Live Births     3             Home Medications    Prior to Admission medications   Medication Sig Start Date End Date Taking? Authorizing Provider  carvedilol (COREG) 6.25 MG tablet Take 1 tablet (6.25 mg total) by mouth 2 (two) times daily with a meal. 08/05/16  Yes Smiley Houseman, MD  insulin glargine (LANTUS) 100 UNIT/ML injection Inject 0.1 mLs (10 Units total) into the skin at bedtime. 08/05/16  Yes Smiley Houseman, MD  LORazepam (ATIVAN) 0.5 MG tablet Take 1  tablet (0.5 mg total) by mouth 2 (two) times daily as needed for anxiety. 08/10/16  Yes Asiyah Cletis Media, MD  losartan (COZAAR) 100 MG tablet Take 100 mg by mouth daily.  06/21/16  Yes Historical Provider, MD  lubiprostone (AMITIZA) 8 MCG capsule Take 1 capsule (8 mcg total) by mouth 2 (two) times daily with a meal. 08/10/16  Yes Asiyah Cletis Media, MD  oxyCODONE (OXY IR/ROXICODONE) 5 MG immediate release tablet Take 1 tablet (5 mg total) by mouth every 4 (four) hours as needed for severe pain. 07/15/16  Yes Blane Ohara McDiarmid, MD  pregabalin (LYRICA) 50 MG capsule TAKE 1 TABLET BY MOUTH AT BEDTIME AS NEEDED Patient taking differently: Take 50 mg by mouth at bedtime as needed (pain).  07/15/16  Yes Blane Ohara McDiarmid, MD  promethazine (PHENERGAN) 12.5 MG tablet Take 1 tablet (12.5 mg total) by mouth every 6 (six) hours as needed for nausea or vomiting. 07/15/16  Yes Blane Ohara McDiarmid, MD    Family History Family History  Problem Relation Age of Onset  . Diabetes type II Mother   . Diabetes Mother   . Hypertension Mother   . Diabetes type II Sister   . Diabetes Sister   . Hypertension Sister   . Hypertension Brother     Social History Social History  Substance Use Topics  . Smoking status: Current Some Day Smoker    Packs/day: 0.50    Years: 22.00    Types: Cigarettes    Start date: 12/27/1982  . Smokeless tobacco: Never Used     Comment: THC 1-2 times per day almost daily  . Alcohol use No     Allergies   Bee venom; Tegaderm ag mesh [silver]; Acetaminophen; Novolog [insulin aspart]; Ceftriaxone; and Erythromycin   Review of Systems Review of Systems   Physical Exam Updated Vital Signs BP 129/63   Pulse 86   Temp 98.7 F (37.1 C) (Oral)   Resp 21   Ht 5\' 1"  (1.549 m)   SpO2 99%   Physical Exam  Constitutional: She is oriented to person, place, and time. She appears well-developed and well-nourished. No distress.  Patient sleeping in the room comfortably and in NAD when I  enter the room.  HENT:  Head: Normocephalic and atraumatic.  Nose: Nose normal.  Mouth/Throat: Oropharynx is clear and moist. No oropharyngeal exudate.  Eyes: Conjunctivae and EOM are normal. Pupils are equal,  round, and reactive to light. No scleral icterus.  Neck: Normal range of motion. Neck supple. No JVD present. No tracheal deviation present. No thyromegaly present.  Cardiovascular: Regular rhythm and normal heart sounds.  Exam reveals no gallop and no friction rub.   No murmur heard. tachcyardic  Pulmonary/Chest: Effort normal and breath sounds normal. No respiratory distress. She has no wheezes. She exhibits no tenderness.  Abdominal: Soft. Bowel sounds are normal. She exhibits no distension and no mass. There is no tenderness. There is no rebound and no guarding.  Musculoskeletal: Normal range of motion. She exhibits no edema or tenderness.  Lymphadenopathy:    She has no cervical adenopathy.  Neurological: She is alert and oriented to person, place, and time. No cranial nerve deficit. She exhibits normal muscle tone.  Skin: Skin is warm and dry. No rash noted. She is not diaphoretic. No erythema. No pallor.  Nursing note and vitals reviewed.    ED Treatments / Results  Labs (all labs ordered are listed, but only abnormal results are displayed) Labs Reviewed  LIPASE, BLOOD - Abnormal; Notable for the following:       Result Value   Lipase 91 (*)    All other components within normal limits  COMPREHENSIVE METABOLIC PANEL - Abnormal; Notable for the following:    Potassium 3.3 (*)    Chloride 95 (*)    CO2 21 (*)    Glucose, Bld 272 (*)    BUN 46 (*)    Creatinine, Ser 3.90 (*)    Total Protein 8.5 (*)    Albumin 5.2 (*)    GFR calc non Af Amer 12 (*)    GFR calc Af Amer 14 (*)    Anion gap 19 (*)    All other components within normal limits  CBC - Abnormal; Notable for the following:    WBC 15.3 (*)    MCHC 36.3 (*)    All other components within normal limits    URINALYSIS, ROUTINE W REFLEX MICROSCOPIC (NOT AT [facility])  I-STAT TROPOININ, ED  I-STAT CG4 LACTIC ACID, ED  I-STAT CG4 LACTIC ACID, ED    EKG  EKG Interpretation  Date/Time:  Thursday November 18 2016 02:39:58 EST Ventricular Rate:  118 PR Interval:  132 QRS Duration: 76 QT Interval:  334 QTC Calculation: 468 R Axis:   70 Text Interpretation:  Poor data quality, interpretation may be adversely affected Sinus tachycardia Biatrial enlargement Septal infarct , age undetermined Abnormal ECG rate is increased Confirmed by Glynn Octave [phone]) on 11/18/2016 3:59:54 AM       Radiology Dg Chest 2 View  Result Date: 11/18/2016 CLINICAL DATA:  Acute onset of mid chest pain. Nausea, vomiting and generalized abdominal pain. Initial encounter. EXAM: CHEST  2 VIEW COMPARISON:  Chest radiograph performed 08/03/2016 FINDINGS: The lungs are well-aerated and clear. There is no evidence of focal opacification, pleural effusion or pneumothorax. The heart is normal in size; the mediastinal contour is within normal limits. A right-sided chest port is noted ending about the proximal to mid SVC. No acute osseous abnormalities are seen. Clips are noted within the right upper quadrant, reflecting prior cholecystectomy. IMPRESSION: No acute cardiopulmonary process seen. Electronically Signed   By: Garald Balding M.D.   On: 11/18/2016 03:25    Procedures Procedures (including critical care time)  Medications Ordered in ED Medications  ondansetron (ZOFRAN-ODT) disintegrating tablet 4 mg (4 mg Oral Refused 11/18/16 0302)  sodium chloride 0.9 % bolus 2,000 mL (2,000  mLs Intravenous New Bag/Given 11/18/16 0438)  ondansetron (ZOFRAN) injection 4 mg (4 mg Intravenous Given 11/18/16 0437)  metoCLOPramide (REGLAN) injection 10 mg (10 mg Intravenous Given 11/18/16 0435)  famotidine (PEPCID) IVPB 20 mg premix (0 mg Intravenous Stopped 11/18/16 0507)  haloperidol lactate (HALDOL) injection 5 mg (5 mg  Intravenous Given 11/18/16 0437)  diphenhydrAMINE (BENADRYL) injection 50 mg (50 mg Intravenous Given 11/18/16 0435)     Initial Impression / Assessment and Plan / ED Course  I have reviewed the triage vital signs and the nursing notes.  Pertinent labs & imaging results that were available during my care of the patient were reviewed by me and considered in my medical decision making (see chart for details).  Clinical Course     Patient presents to the ED for acute on chronic abdominal pain.  When I entered the room she appeared comfortable laying under her blanket.  After waking her, she stated that she is still nauseous and spit in a bag.  No true emesis seen in the ED.  She was given IVF, haldol, zofran, reglan, and famotidine for her symptoms.  6:34 AM  Upon repeat evaluation, patient states she feels much better and was found sleeping again.  She feels comfortable going home.  Labs reveal mildly elevated lipase, mildly elevated AG and low potassium, likely from vomiting.  She was given IVF.PCP fu advised within 5 days.  VS have mproved back to normal.  Patient safe for DC.   Final Clinical Impressions(s) / ED Diagnoses   Final diagnoses:  Non-intractable vomiting with nausea, unspecified vomiting type    New Prescriptions New Prescriptions   No medications on file     Everlene Balls, MD 11/18/16 [phone]

## 2016-11-18 NOTE — ED Notes (Signed)
Notified nurse abt why no istat lactic acid was.

## 2016-11-18 NOTE — ED Triage Notes (Signed)
Pt presents to the ED with complaints of N/V, diarrhea and chest pain. The N/V started 2 days ago. Pt states she had bad abdominal cramps and then started vomiting. Took Phergen with some relief but continued to vomit. Pt states chest pain started at 1900 on 11/17/2016. Pt is afebrile but diaphoretic

## 2016-11-21 IMAGING — DX DG CHEST 2V
2 series · 2 of 2 positions shown · non-contrast
Comparison: 04/30/2016

CLINICAL DATA: Midline and LEFT side chest pain this morning,
midline stomach pain for 3 days with nausea and vomiting,
hypertension, diabetes mellitus, smoker

EXAM:
CHEST  2 VIEW

[chest lat]
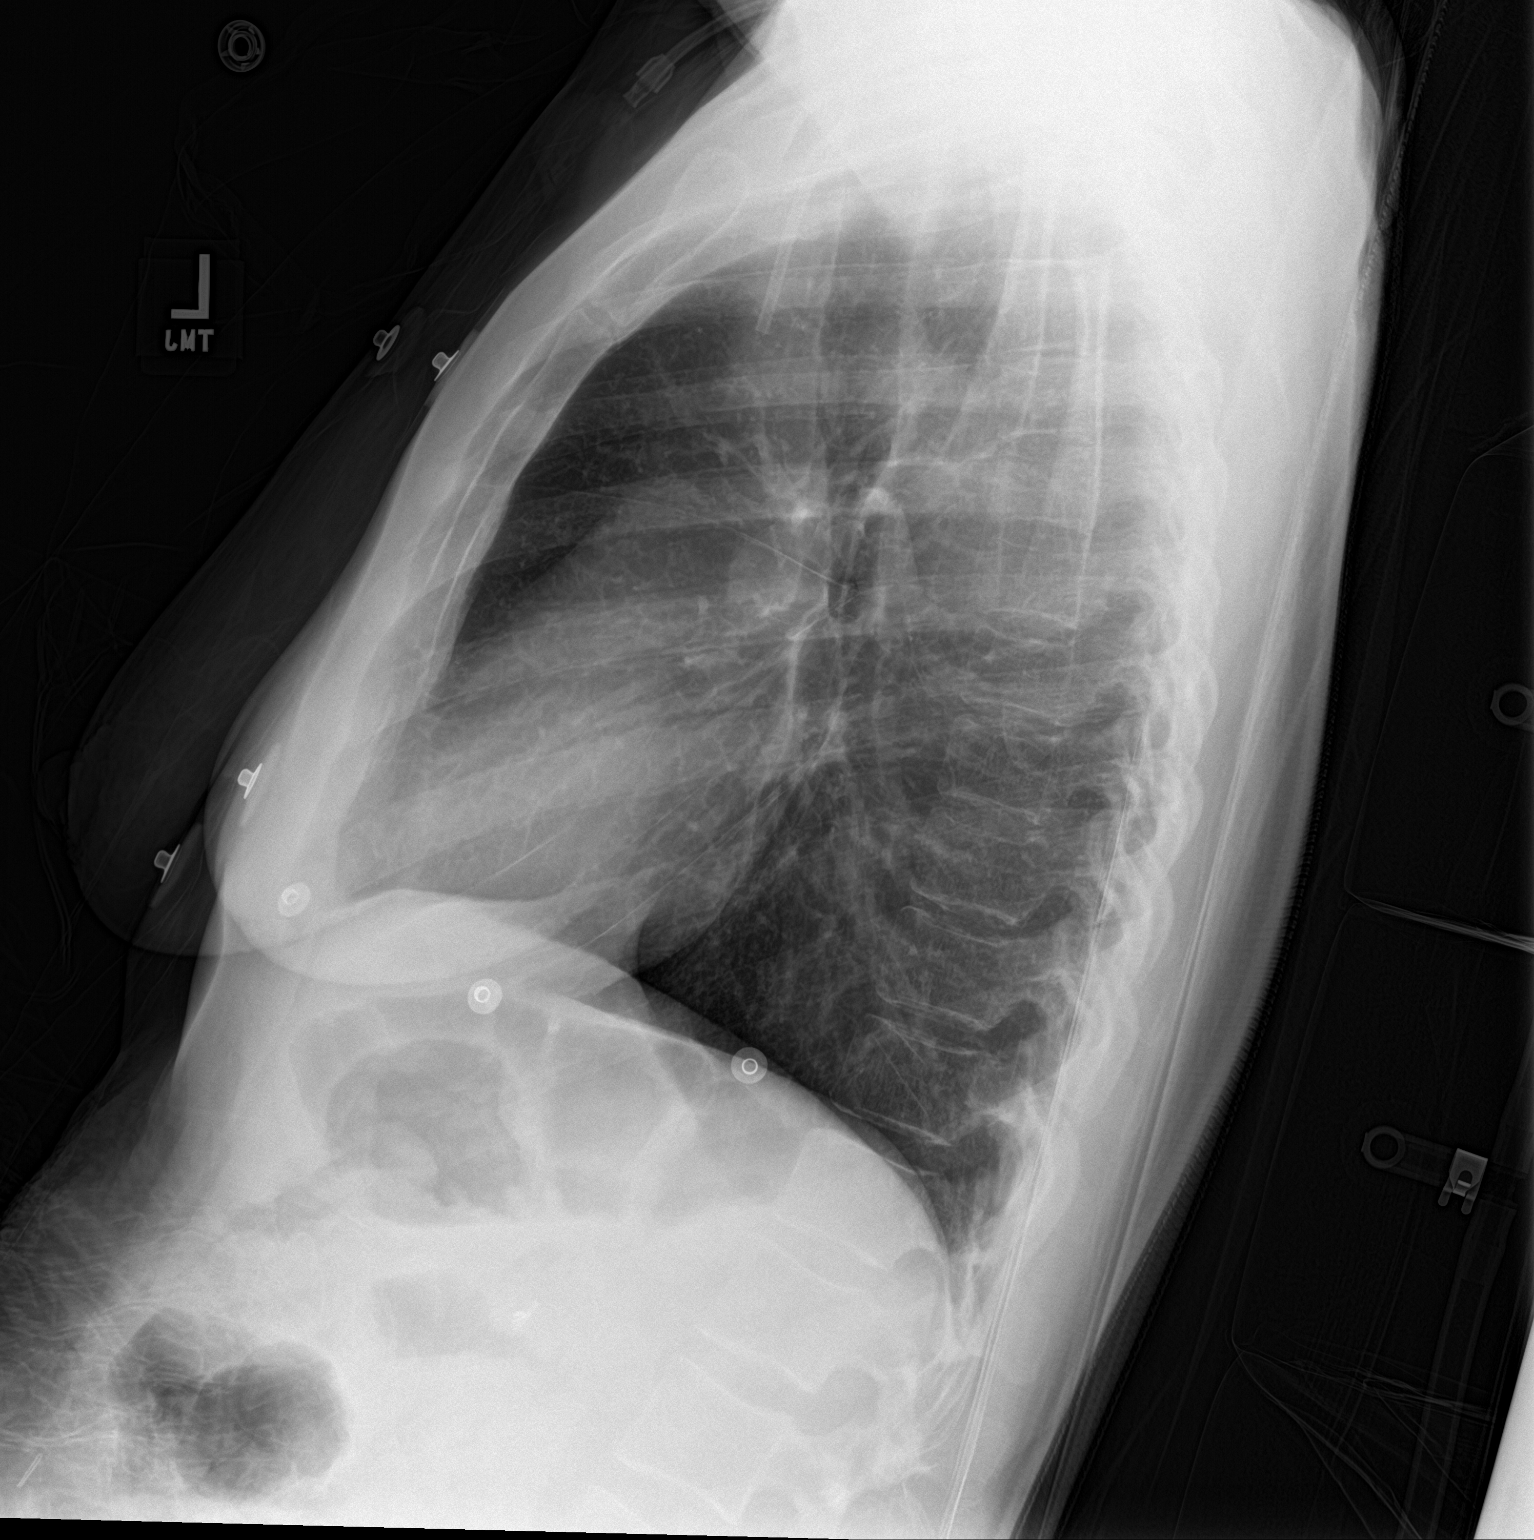

[chest ap]
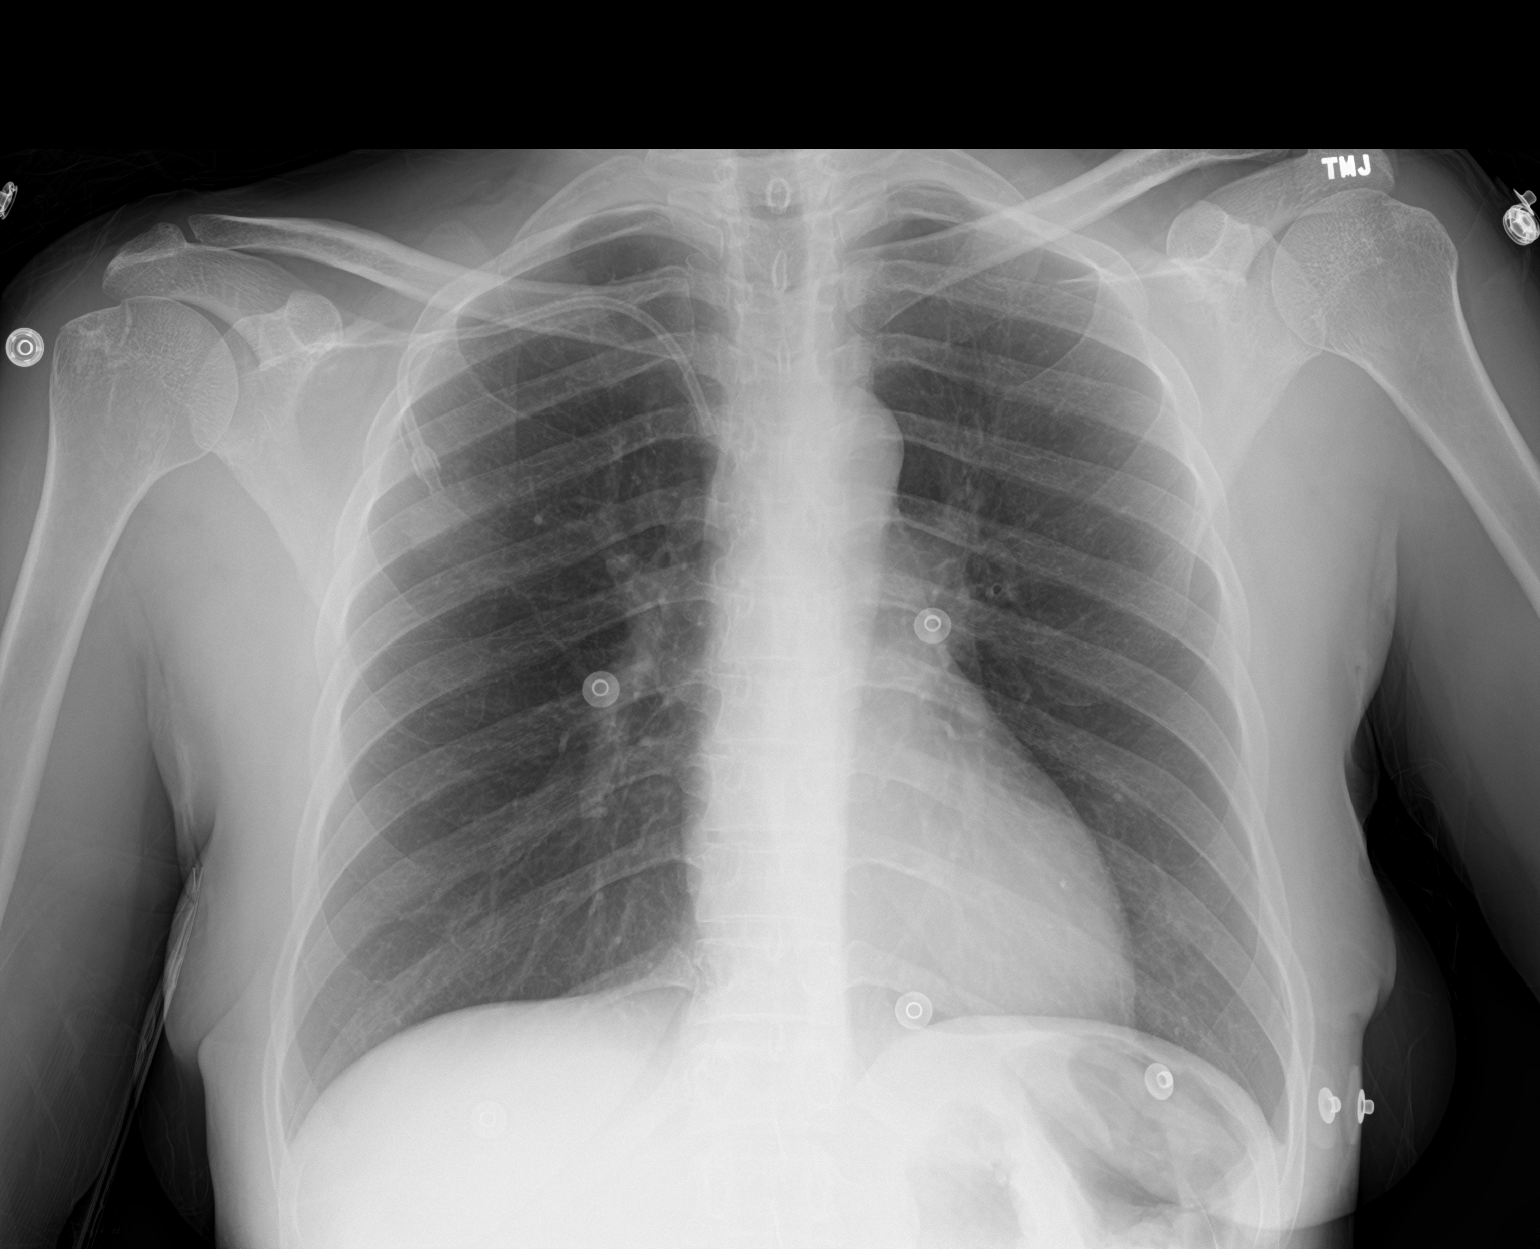

[2 of 2 positions shown; findings below may reference images not displayed]

FINDINGS: RIGHT subclavian Port-A-Cath with tip projecting over SVC.

Normal heart size, mediastinal contours, and pulmonary vascularity.

Lungs clear.

No pleural effusion or pneumothorax.

Bones unremarkable.
IMPRESSION: No acute abnormalities.

## 2016-11-25 ENCOUNTER — Other Ambulatory Visit: Payer: Self-pay | Admitting: Family Medicine

## 2016-11-25 ENCOUNTER — Ambulatory Visit (INDEPENDENT_AMBULATORY_CARE_PROVIDER_SITE_OTHER): Payer: Medicare Other | Admitting: Family Medicine

## 2016-11-25 ENCOUNTER — Other Ambulatory Visit: Payer: Self-pay | Admitting: Internal Medicine

## 2016-11-25 ENCOUNTER — Encounter: Payer: Self-pay | Admitting: Family Medicine

## 2016-11-25 VITALS — BP 137/70 | HR 56 | Ht 61.0 in | Wt 139.0 lb

## 2016-11-25 DIAGNOSIS — Z794 Long term (current) use of insulin: Secondary | ICD-10-CM

## 2016-11-25 DIAGNOSIS — I10 Essential (primary) hypertension: Secondary | ICD-10-CM

## 2016-11-25 DIAGNOSIS — N183 Chronic kidney disease, stage 3 unspecified: Secondary | ICD-10-CM

## 2016-11-25 DIAGNOSIS — G8929 Other chronic pain: Secondary | ICD-10-CM

## 2016-11-25 DIAGNOSIS — E7800 Pure hypercholesterolemia, unspecified: Secondary | ICD-10-CM

## 2016-11-25 DIAGNOSIS — K3184 Gastroparesis: Secondary | ICD-10-CM | POA: Diagnosis not present

## 2016-11-25 DIAGNOSIS — K5900 Constipation, unspecified: Secondary | ICD-10-CM

## 2016-11-25 DIAGNOSIS — E1149 Type 2 diabetes mellitus with other diabetic neurological complication: Secondary | ICD-10-CM | POA: Diagnosis not present

## 2016-11-25 DIAGNOSIS — F17200 Nicotine dependence, unspecified, uncomplicated: Secondary | ICD-10-CM

## 2016-11-25 DIAGNOSIS — F1220 Cannabis dependence, uncomplicated: Secondary | ICD-10-CM

## 2016-11-25 DIAGNOSIS — E119 Type 2 diabetes mellitus without complications: Secondary | ICD-10-CM

## 2016-11-25 DIAGNOSIS — F1290 Cannabis use, unspecified, uncomplicated: Secondary | ICD-10-CM

## 2016-11-25 DIAGNOSIS — G43A0 Cyclical vomiting, not intractable: Secondary | ICD-10-CM

## 2016-11-25 DIAGNOSIS — E1165 Type 2 diabetes mellitus with hyperglycemia: Secondary | ICD-10-CM

## 2016-11-25 DIAGNOSIS — E559 Vitamin D deficiency, unspecified: Secondary | ICD-10-CM

## 2016-11-25 DIAGNOSIS — K219 Gastro-esophageal reflux disease without esophagitis: Secondary | ICD-10-CM

## 2016-11-25 DIAGNOSIS — K5909 Other constipation: Secondary | ICD-10-CM

## 2016-11-25 LAB — POCT GLYCOSYLATED HEMOGLOBIN (HGB A1C): Hemoglobin A1C: 7

## 2016-11-25 MED ORDER — CARVEDILOL 6.25 MG PO TABS: 6.2500 mg | ORAL_TABLET | Freq: Two times a day (BID) | ORAL | 3 refills | Status: DC

## 2016-11-25 MED ORDER — LORAZEPAM 0.5 MG PO TABS: 0.5000 mg | ORAL_TABLET | Freq: Two times a day (BID) | ORAL | 3 refills | Status: DC | PRN

## 2016-11-25 MED ORDER — INSULIN GLARGINE 100 UNIT/ML SOLOSTAR PEN: 10.0000 [IU] | PEN_INJECTOR | Freq: Every day | SUBCUTANEOUS | 99 refills | Status: DC

## 2016-11-25 MED ORDER — LOSARTAN POTASSIUM 100 MG PO TABS: 100.0000 mg | ORAL_TABLET | Freq: Every day | ORAL | 3 refills | Status: DC

## 2016-11-25 MED ORDER — LUBIPROSTONE 24 MCG PO CAPS
24.0000 ug | ORAL_CAPSULE | Freq: Two times a day (BID) | ORAL | 3 refills | Status: DC
Start: 2016-11-25 — End: 2017-03-10

## 2016-11-25 MED ORDER — OMEPRAZOLE 20 MG PO CPDR: DELAYED_RELEASE_CAPSULE | ORAL | 3 refills | Status: DC

## 2016-11-25 MED ORDER — PREGABALIN 50 MG PO CAPS: ORAL_CAPSULE | ORAL | 3 refills | Status: DC

## 2016-11-25 MED ORDER — PROMETHAZINE HCL 12.5 MG PO TABS: 12.5000 mg | ORAL_TABLET | Freq: Four times a day (QID) | ORAL | 3 refills | Status: DC | PRN

## 2016-11-25 MED ORDER — OXYCODONE HCL 5 MG PO TABS: 5.0000 mg | ORAL_TABLET | ORAL | 0 refills | Status: DC | PRN

## 2016-11-25 MED ORDER — CHOLECALCIFEROL 10 MCG (400 UNIT) PO TABS: 2000.0000 [IU] | ORAL_TABLET | Freq: Every day | ORAL | Status: DC

## 2016-11-25 MED ORDER — PROMETHAZINE HCL 25 MG RE SUPP: 25.0000 mg | Freq: Four times a day (QID) | RECTAL | 3 refills | Status: DC | PRN

## 2016-11-25 NOTE — Progress Notes (Signed)
Subjective:    Patient ID: [name] [name], female    DOB: September 01, 1966, 50 y.o.   MRN: [id number] [name] [name] is alone Sources of clinical information for visit is/are patient and past medical records. Nursing assessment for this office visit was reviewed with the patient for accuracy and revision.   HPI Problem List Items Addressed This Visit      High   Cyclic vomiting syndrome - Chronic problem - ran out of ativan and percocet in September.  Did not know she could call for refills - ED visit 11/23 for vomiting: treated with IVF/Haldol/zofran/ reglan/ pepcid With good response. - Sleep helps break cycle of vomiting - Pt tries to take phenergan supp early in attack, then take oralorazepam. She usually uses percocet with attacks. - She denies attack in last 2 months except for 11/23   Uncontrolled secondary diabetes mellitus with stage 3 CKD (GFR 30-59) (HCC)   Relevant Medications   Insulin Glargine (LANTUS SOLOSTAR) 100 UNIT/ML Solostar Pen   losartan (COZAAR) 100 MG tablet   Marijuana smoker ([facility]) - Chronic use - reports decreases nausea/vomiting   DM neuropathies ([place]) - longstanding problem - taking Lyrica with good control of peripheral pain. - No edema, no confusion, no dizziness, no weight gain   Relevant Medications   Insulin Glargine (LANTUS SOLOSTAR) 100 UNIT/ML Solostar Pen   losartan (COZAAR) 100 MG tablet   pregabalin (LYRICA) 50 MG capsule     Medium   TOBACCO DEPENDENCE - Smoking 6 cigarettes a day - Not contemplating abstinence   Pure hypercholesterolemia - longstanding issue - No chest pain, no claudication   Relevant Medications   carvedilol (COREG) 6.25 MG tablet   losartan (COZAAR) 100 MG tablet   Vitamin D deficiency - Dx 11/2015 29 pg/mL - not taking vitamin supplement   Essential hypertension Disease Monitoring  Blood pressure range: not measuring at home  Chest pain: no   Dyspnea: no   Claudication: no   Medication compliance: yes,  losatan and coreg  Medication Side Effects  Lightheadedness: no   Urinary frequency: no   Edema: no      Preventitive Healthcare:  Exercise: no   Diet Pattern: low salt  -   Relevant Medications   carvedilol (COREG) 6.25 MG tablet   losartan (COZAAR) 100 MG tablet     Low   GERD   Relevant Medications   omeprazole (PRILOSEC) 20 MG capsule   lubiprostone (AMITIZA) 24 MCG capsule   Intermittent constipation - longstanding issue - initial good response to Amitaza at 8 mcg daily has decreased.  - hard stools.  No diarrhea    Other Visit Diagnoses    Type 2 diabetes mellitus with hyperglycemia, without long-term current use of insulin ([place])    -  -   Disease Monitoring  Blood Sugar Ranges: running in 100s  Polyuria: no   Visual problems: no   Medication Compliance: yes, Lantus 10 units in morning  Medication Side Effects  Hypoglycemia: no   Preventitive Health Care             Daily Aspirin: no              Statin: no             Dental evaluation in 73-months: had extraction of all maxillary teeth.  No dentures.  Unable to return to dentist because she has an outstanding bill with them.  ACEI: Losartan  Eye Exam: no  Foot Exam: no    Relevant Medications   Insulin Glargine (LANTUS SOLOSTAR) 100 UNIT/ML Solostar Pen   losartan (COZAAR) 100 MG tablet   Other Relevant Orders   HgB A1c (Completed)   Gastroparesis   - Longstanding - Cyclic vomiting- - last attack 11/23 controlled in ED with Haldol/reglan/phenergan - Has not called for refills for Ativan and Percocetr     Relevant Medications   LORazepam (ATIVAN) 0.5 MG tablet   oxyCODONE (OXY IR/ROXICODONE) 5 MG immediate release tablet   SH: smoking status noted  Review of Systems See hpi    Objective:   Physical Exam Diabetic Foot Exam - Simple   Simple Foot Form Visual Inspection No deformities, no ulcerations, no other skin breakdown bilaterally:  Yes Sensation Testing See comments:   Yes Pulse Check Comments Decrease sensation to monofilament 3 ourt of 5 bilaterally   Cor: RRR, no m/g/r Lung: BCTA, no acc mm use Abd: soft, nt, nd (+) BS Ext: no edema, 1+ DPP bil;    Assessment & Plan:  See problem list

## 2016-11-25 NOTE — Patient Instructions (Addendum)
Your diabetes is under good control, Hemoglobin A1c 7.0%.  Keep taking your Lantus 10 units daily.  Start omeprazole (Prilosec0 for acid reflus and burning in chest when vomiting during an attack.  Take it daily until the attack symptoms have stopped.   We will set up a referral to Sports Medicine for your right shoulder pain and limitations.   Start taking Vitamin D.  Your level in December of last year was low.  We will call in the Vitamin D to your pharmacy. This will help keep your bones strong as you get older.    We will talk with or Social Worker to see if there are options to get upper plate denture.   Call to set up your Mammogram to screen for breast cancer.   The Amitiza dose was increased to 24 mcg.  See if it helps with your constipation.   We will talk about trying a cholesterol lowering medicine called Lipitor on your next office visits.

## 2016-11-26 ENCOUNTER — Encounter: Payer: Self-pay | Admitting: Family Medicine

## 2016-11-26 MED ORDER — ASPIRIN 81 MG PO TABS: 81.0000 mg | ORAL_TABLET | Freq: Every day | ORAL | 99 refills | Status: DC

## 2016-11-26 NOTE — Assessment & Plan Note (Signed)
Established problem Precontemplative Motivation interviewing

## 2016-11-26 NOTE — Assessment & Plan Note (Signed)
Established problem. Stable. Started on-demand omeprazole 20 mg but note from Dr Henrene Pastor after EGD found severe esophagitis so recommended continuous standard PPI therapy.  Will address next office visit

## 2016-11-26 NOTE — Assessment & Plan Note (Signed)
Adequate glycemic control. A1c 7.0 %.   Pt is tolerating the current medication regiment. Continue current treatment plan of Lantus 10 units [place] qam  Diabetes Prevention             Daily Aspirin: Start daily Asp[irin 81 mg              Statin: 12% CVE 10-yr risk Patient wants to delay start of this b/c of fear of causing an vomiting attack             Dental evaluation in 59-month: Gave telephone number to [facility] to contact about getting dentures             Recent eGFR: Cr 3.9 at ED visit (baseline around 2.0 +/- 0.5              ACEI: Losartan  Eye Exam: recommended  Foot Exam: done

## 2016-11-26 NOTE — Assessment & Plan Note (Signed)
Established problem. Stable. Continue current therapy Coreg and Losartan

## 2016-11-26 NOTE — Assessment & Plan Note (Signed)
Established problem Recommend start Vitamin D 2000 units daily

## 2016-11-26 NOTE — Assessment & Plan Note (Signed)
Established problem worsened.  Decreased in frequncy of BMs and increase firmness in BMs Increase Amitaza to 24 mcg daily and monitor for response

## 2016-11-26 NOTE — Assessment & Plan Note (Signed)
Established problem Controlled Refilled Ativan and Percocet.  No evidence of abuse.  Review of [place] CSRS did not show evidence of multiple prescribers.  Add omeprazole on-demand

## 2016-11-26 NOTE — Assessment & Plan Note (Signed)
11/18/16 Creatinine EDV w/ cyclic vomiting attack.  3.9 (MDRD 16 ml/min), usual around 30 - 40 ml/min K+ WNL Will need repeat BMET next week

## 2016-12-02 ENCOUNTER — Other Ambulatory Visit: Payer: Self-pay | Admitting: Family Medicine

## 2016-12-02 MED ORDER — PEN NEEDLES 30G X 5 MM MISC: 10.0000 [IU] | Freq: Every day | 99 refills | Status: DC

## 2016-12-02 NOTE — Telephone Encounter (Signed)
Needs needle tip for the insulin pen.  [facility] at [address]

## 2017-01-20 ENCOUNTER — Encounter: Payer: Self-pay | Admitting: Family Medicine

## 2017-01-20 ENCOUNTER — Ambulatory Visit (INDEPENDENT_AMBULATORY_CARE_PROVIDER_SITE_OTHER): Payer: Medicare Other | Admitting: Family Medicine

## 2017-01-20 ENCOUNTER — Other Ambulatory Visit: Payer: Self-pay | Admitting: Family Medicine

## 2017-01-20 VITALS — BP 142/70 | HR 66 | Temp 98.0°F | Wt 138.0 lb

## 2017-01-20 DIAGNOSIS — K5900 Constipation, unspecified: Secondary | ICD-10-CM

## 2017-01-20 DIAGNOSIS — M2041 Other hammer toe(s) (acquired), right foot: Secondary | ICD-10-CM

## 2017-01-20 DIAGNOSIS — M2042 Other hammer toe(s) (acquired), left foot: Secondary | ICD-10-CM

## 2017-01-20 DIAGNOSIS — K209 Esophagitis, unspecified without bleeding: Secondary | ICD-10-CM

## 2017-01-20 DIAGNOSIS — I10 Essential (primary) hypertension: Secondary | ICD-10-CM

## 2017-01-20 DIAGNOSIS — E1322 Other specified diabetes mellitus with diabetic chronic kidney disease: Secondary | ICD-10-CM | POA: Diagnosis not present

## 2017-01-20 DIAGNOSIS — N184 Chronic kidney disease, stage 4 (severe): Secondary | ICD-10-CM | POA: Diagnosis not present

## 2017-01-20 DIAGNOSIS — N183 Chronic kidney disease, stage 3 (moderate): Secondary | ICD-10-CM

## 2017-01-20 DIAGNOSIS — K3184 Gastroparesis: Secondary | ICD-10-CM | POA: Diagnosis not present

## 2017-01-20 DIAGNOSIS — K5909 Other constipation: Secondary | ICD-10-CM

## 2017-01-20 DIAGNOSIS — N2581 Secondary hyperparathyroidism of renal origin: Secondary | ICD-10-CM | POA: Diagnosis not present

## 2017-01-20 DIAGNOSIS — Z95828 Presence of other vascular implants and grafts: Secondary | ICD-10-CM

## 2017-01-20 DIAGNOSIS — Z23 Encounter for immunization: Secondary | ICD-10-CM

## 2017-01-20 DIAGNOSIS — F419 Anxiety disorder, unspecified: Secondary | ICD-10-CM

## 2017-01-20 DIAGNOSIS — E1365 Other specified diabetes mellitus with hyperglycemia: Secondary | ICD-10-CM

## 2017-01-20 DIAGNOSIS — E1143 Type 2 diabetes mellitus with diabetic autonomic (poly)neuropathy: Secondary | ICD-10-CM

## 2017-01-20 DIAGNOSIS — E7800 Pure hypercholesterolemia, unspecified: Secondary | ICD-10-CM

## 2017-01-20 DIAGNOSIS — E118 Type 2 diabetes mellitus with unspecified complications: Secondary | ICD-10-CM

## 2017-01-20 DIAGNOSIS — IMO0002 Reserved for concepts with insufficient information to code with codable children: Secondary | ICD-10-CM

## 2017-01-20 DIAGNOSIS — G43A0 Cyclical vomiting, not intractable: Secondary | ICD-10-CM

## 2017-01-20 DIAGNOSIS — E559 Vitamin D deficiency, unspecified: Secondary | ICD-10-CM

## 2017-01-20 DIAGNOSIS — Z794 Long term (current) use of insulin: Secondary | ICD-10-CM

## 2017-01-20 DIAGNOSIS — Z79899 Other long term (current) drug therapy: Secondary | ICD-10-CM

## 2017-01-20 HISTORY — DX: Other hammer toe(s) (acquired), right foot: M20.41

## 2017-01-20 HISTORY — DX: Presence of other vascular implants and grafts: Z95.828

## 2017-01-20 LAB — POCT GLYCOSYLATED HEMOGLOBIN (HGB A1C): Hemoglobin A1C: 6.8

## 2017-01-20 MED ORDER — LORAZEPAM 0.5 MG PO TABS: 0.5000 mg | ORAL_TABLET | Freq: Four times a day (QID) | ORAL | 5 refills | Status: DC | PRN

## 2017-01-20 MED ORDER — ONETOUCH ULTRA SYSTEM W/DEVICE KIT: 1.0000 | PACK | Freq: Two times a day (BID) | 5 refills | Status: DC

## 2017-01-20 MED ORDER — OXYCODONE HCL 5 MG PO TABS: 5.0000 mg | ORAL_TABLET | ORAL | 0 refills | Status: DC | PRN

## 2017-01-20 NOTE — Assessment & Plan Note (Signed)
Established problem. Improving from most current exacerbation. No recent EDvisits nor hospitalizations Rx refill oxycodone and Lorazepam.  Continue phenergan.

## 2017-01-20 NOTE — Assessment & Plan Note (Addendum)
Established problem. Stable. Continue current therapy of Lantus 10 units daily. No hypoglycemic episodes.  Lab Results  Component Value Date   HGBA1C 6.8 01/20/2017   Improved glycemic control on Lantus may be due to worsening GFR rather than improved diet and medication therapy.  Recommended pt restart baby Aspirin daily.  Referral to posdiatry for hammer toes with preulcerative calluses in peripheral neuropathy.

## 2017-01-20 NOTE — Assessment & Plan Note (Signed)
Checking Phosphorus today

## 2017-01-20 NOTE — Assessment & Plan Note (Signed)
Pt with history of severe esophagitis on EGD (Dr Henrene Pastor). Recommended that she stay on daily omeprazole 40 mg.

## 2017-01-20 NOTE — Patient Instructions (Signed)
Great Blood sugar control.  Your A1c was 6,8%!!! Keep taking your Lantus like you are.  Consider starting a baby Aspirin (81 mg) daily.  It is OK to take Tylenol (acetaminophen) 325 mg every 6 hours if your need it for headache.   We will set up an appointment for you with a podiatrist to care for your feet.

## 2017-01-20 NOTE — Assessment & Plan Note (Signed)
Should this indwelling device be removed at some point if it is not used for some particular period of time?

## 2017-01-20 NOTE — Assessment & Plan Note (Signed)
New problem Normal TSH 2017 Will reassess on next office visit.  Question of Panic attacks.

## 2017-01-20 NOTE — Assessment & Plan Note (Signed)
Referral to podiatry  

## 2017-01-20 NOTE — Assessment & Plan Note (Signed)
Established problem Controlled Continue statin

## 2017-01-20 NOTE — Assessment & Plan Note (Signed)
Monitoring GFR today. Pt reports having Renal consult in February already scheduled.

## 2017-01-20 NOTE — Progress Notes (Signed)
Subjective:    Patient ID: [name] [name], female    DOB: 1966-02-15, 51 y.o.   MRN: [id number] PERI KREFT is alone Sources of clinical information for visit is/are patient and past medical records. Nursing assessment for this office visit was reviewed with the patient for accuracy and revision.   HPI Problem List Items Addressed This Visit      High   Anxiety disorder - 3 weeks ago onset - unprovoked espisode of anxiety, chest tightness, fear of passing out   Cyclic vomiting syndrome - longstanding issue - 3 exac espisodes since 11/25/16. Most recent attack was 3 days ago.  It is resolving.  She has been able to keep down food and liquids starting yesterday.  - No ED visits here or in [place].  - Ran out of Ativan in beginning of January.  - Reports one oxycodone left. - Missing work since 1/15 because of illness. -    CKD (chronic kidney disease) stage 4, GFR 15-29 ml/min (HCC) - Recent vomiting. - No edema - no shortness of breath   Relevant Orders   Phosphorus   Comprehensive metabolic panel   Relevant Orders   Phosphorus   Comprehensive metabolic panel   RESOLVED: Uncontrolled secondary diabetes mellitus with stage 3 CKD (GFR 30-59) (HCC) - Primary   Relevant Orders   HgB A1c (Completed)   Pneumococcal polysaccharide vaccine 23-valent greater than or equal to 2yo subcutaneous/IM (Completed)   Phosphorus   Comprehensive metabolic panel     Medium   Vitamin D deficiency - not taking her vitamin D b/c she forgets.    Encounter for chronic pain management   Essential hypertension - Not checking BP at home - taking her Coreg and lisinopril - No chest pain, no claudication, no shortness of breath     Low   Intermittent constipation - improved with increase3 dose of Amitiza     Unprioritized   Esophagitis determined by endoscopy -taking omeprazole intermittently    Other Visit Diagnoses    Gastroparesis       Relevant Medications   LORazepam (ATIVAN)  0.5 MG tablet   oxyCODONE (OXY IR/ROXICODONE) 5 MG immediate release tablet   Other Relevant Orders   Phosphorus   Comprehensive metabolic panel   High risk medication use       Relevant Orders   Phosphorus   Comprehensive metabolic panel     SH: Smoking 1/4 pack cigarettes a day. Smokes marijuana daily.  Starts vomiting when she stops smoking marijuana.  Started working in [employer] at [employer] in Oct 2017.    Review of Systems No fever No urinary incontinence No falls    Objective:   Physical Exam VS reviewed GEN: Alert, Cooperative, Groomed, NAD HEENT: PERRL; EAC bilaterally not occluded, TM's translucent with normal LM, (+) LR;                No cervical LAN, No thyromegaly, No palpable masses COR: RRR, No M/G/R, No JVD, Normal PMI size and location LUNGS: BCTA, No Acc mm use, speaking in full sentences ABDOMEN: (+)BS, soft, NT, ND, No HSM, No palpable masses GU: Normal Rectal tone, no palpable masses, prostate without hypertrophy/asymmetry/nodularity. Hemoccult negative. EXT: No peripheral leg edema. Diabetic Foot Exam - Simple   Simple Foot Form Diabetic Foot exam was performed with the following findings:  Yes 01/20/2017  3:54 PM  Visual Inspection See comments:  Yes Sensation Testing See comments:  Yes Pulse Check Posterior Tibialis and Dorsalis pulse  intact bilaterally:  Yes Comments 3/5 monofilament touch bilaterally preulcerative calluses over first and fifth metatarsal head areas bilaterally Calluses on dorsum of 4th and 5th toes bilaterally    Gait: Normal speed, No significant path deviation, Step through +,  Psych: Normal affect/thought/speech/language  Assessment & Plan:  See problem list

## 2017-01-20 NOTE — Assessment & Plan Note (Signed)
Recommended patient to resume taking daily Cholcalciferol 1000 units

## 2017-02-07 ENCOUNTER — Encounter: Payer: Self-pay | Admitting: Family Medicine

## 2017-02-24 LAB — PHOSPHORUS: Phosphorus: 3.8 mg/dL (ref 2.5–4.5)

## 2017-02-24 LAB — PLEASE NOTE

## 2017-03-04 DIAGNOSIS — N183 Chronic kidney disease, stage 3 (moderate): Secondary | ICD-10-CM | POA: Diagnosis not present

## 2017-03-10 ENCOUNTER — Ambulatory Visit (INDEPENDENT_AMBULATORY_CARE_PROVIDER_SITE_OTHER): Payer: Medicare Other | Admitting: Family Medicine

## 2017-03-10 ENCOUNTER — Encounter: Payer: Self-pay | Admitting: Family Medicine

## 2017-03-10 VITALS — BP 120/70 | HR 61 | Temp 97.7°F | Wt 137.0 lb

## 2017-03-10 DIAGNOSIS — Z598 Other problems related to housing and economic circumstances: Secondary | ICD-10-CM

## 2017-03-10 DIAGNOSIS — R1115 Cyclical vomiting syndrome unrelated to migraine: Secondary | ICD-10-CM

## 2017-03-10 DIAGNOSIS — F1220 Cannabis dependence, uncomplicated: Secondary | ICD-10-CM | POA: Diagnosis not present

## 2017-03-10 DIAGNOSIS — Z596 Low income: Secondary | ICD-10-CM

## 2017-03-10 DIAGNOSIS — E118 Type 2 diabetes mellitus with unspecified complications: Secondary | ICD-10-CM

## 2017-03-10 DIAGNOSIS — F339 Major depressive disorder, recurrent, unspecified: Secondary | ICD-10-CM

## 2017-03-10 DIAGNOSIS — Z794 Long term (current) use of insulin: Secondary | ICD-10-CM | POA: Diagnosis not present

## 2017-03-10 DIAGNOSIS — Z5989 Other problems related to housing and economic circumstances: Secondary | ICD-10-CM | POA: Insufficient documentation

## 2017-03-10 DIAGNOSIS — K3184 Gastroparesis: Secondary | ICD-10-CM

## 2017-03-10 DIAGNOSIS — G43A1 Cyclical vomiting, intractable: Secondary | ICD-10-CM

## 2017-03-10 DIAGNOSIS — I10 Essential (primary) hypertension: Secondary | ICD-10-CM

## 2017-03-10 DIAGNOSIS — F1290 Cannabis use, unspecified, uncomplicated: Secondary | ICD-10-CM

## 2017-03-10 HISTORY — DX: Low income: Z59.6

## 2017-03-10 HISTORY — DX: Other problems related to housing and economic circumstances: Z59.89

## 2017-03-10 LAB — GLUCOSE, POCT (MANUAL RESULT ENTRY): POC GLUCOSE: 165 mg/dL — AB (ref 70–99)

## 2017-03-10 MED ORDER — LUBIPROSTONE 24 MCG PO CAPS: 24.0000 ug | ORAL_CAPSULE | Freq: Two times a day (BID) | ORAL | 3 refills | Status: DC

## 2017-03-10 MED ORDER — CARVEDILOL 6.25 MG PO TABS: 6.2500 mg | ORAL_TABLET | Freq: Two times a day (BID) | ORAL | 3 refills | Status: DC

## 2017-03-10 MED ORDER — PROMETHAZINE HCL 25 MG RE SUPP: 25.0000 mg | Freq: Four times a day (QID) | RECTAL | 3 refills | Status: DC | PRN

## 2017-03-10 MED ORDER — OMEPRAZOLE 20 MG PO CPDR: DELAYED_RELEASE_CAPSULE | ORAL | 3 refills | Status: DC

## 2017-03-10 MED ORDER — OXYCODONE HCL 5 MG PO TABS: 5.0000 mg | ORAL_TABLET | ORAL | 0 refills | Status: DC | PRN

## 2017-03-10 MED ORDER — PROMETHAZINE HCL 12.5 MG PO TABS: 12.5000 mg | ORAL_TABLET | Freq: Four times a day (QID) | ORAL | 3 refills | Status: DC | PRN

## 2017-03-10 MED ORDER — LOSARTAN POTASSIUM 100 MG PO TABS: 100.0000 mg | ORAL_TABLET | Freq: Every day | ORAL | 3 refills | Status: DC

## 2017-03-10 MED ORDER — INSULIN GLARGINE 100 UNIT/ML SOLOSTAR PEN: 10.0000 [IU] | PEN_INJECTOR | Freq: Every day | SUBCUTANEOUS | 99 refills | Status: DC

## 2017-03-10 MED ORDER — LORAZEPAM 0.5 MG PO TABS: 0.5000 mg | ORAL_TABLET | Freq: Four times a day (QID) | ORAL | 5 refills | Status: DC | PRN

## 2017-03-10 NOTE — Patient Instructions (Signed)
Go home and rest.  Start back on your lorazapaqm, oxycodone, phenrgan and omeprazole for this latest atatck of vomiting.  If you do not get better in the next 2 days or if you get worse, go to the Emergency room for possible admission to the hospital.

## 2017-03-10 NOTE — Progress Notes (Signed)
Patient ID: [name] [name], female   DOB: [birth year], 51 y.o.   MRN: [id number]

## 2017-03-10 NOTE — Assessment & Plan Note (Signed)
Onset of worsening of Nausea/Vomiting gastroparesis exacerbations with increase of work hours at [employer] from part time to full time. Difficulty affording med copays with recent loss of SSI because of working at [employer] at [employer]  Lives in [place] to care for grandchildren, was driing to work at [employer] in [place] several times a week. She did enjoy working at [employer] because it got her out of the house and social contact.

## 2017-03-10 NOTE — Progress Notes (Signed)
Subjective:     Patient ID: [name] [name], female   DOB: 26-Jan-1966, 51 y.o.   MRN: [id number] [name] [name] is alone Sources of clinical information for visit is/are patient and past medical records. Nursing assessment for this office visit was reviewed with the patient for accuracy and revision.   HPI  Vomiting Similar to symptoms of gastroparesis exacerbations in past Onset: several days ago SEEN PREVIOUSLY FOR THIS?: multiple, this is a longstanding diagnosis. Last seen several months ago by ER (11/18/16) EVALUATION TO DATE: none TX TO DATE: Phenergan supp/tab, has 8 tab oxycodone remaining and No lorazepam DESCRIPTION OF SX: moderate abdominal pain, no bloody nonpaiful diarrhea occurring as watery stool a couple times a day and ability to keep down some fluids DENIES: alcohol overuse, fever, hematemesis and melena   Frequency: vomiting several times a day, especially after oral intake of fluids or food, continuous nausea.        Course: stable Pattern: continuous nausea  Better with: staying still Worse with: activity, driving.  Had episode of nausea whil driving to doctor's office today that required her to pull to side of road to vomit Social: She has not been able to work for the last week because of recurrent bouts of n/v Since last office visit patient with mulpitle bouts of n/v lasting several days.  No visits to ED nor hsopitalizations.  She has been able to "ride out" the bouts at home with ativan and phenergan   Symptoms Pelvic Pain: no    Vomiting of food eaten several hours earlier: no, within seconds to minutes of eating  Chest Pain:  no  Abdominal Swelling:  no  Headache: no  Vertigo: YES/NO/WILD [id number]}   Diarrhea: yes - often accompanies gastroparesis exacerbations Constipation: no  Hematemesis: no  Anorexia: yes  Fever/Chills: no  Dysuria: no  Rash: no  Wt loss: no  EtOH use: no  NSAIDs/ASA: no Birth Control: no   LMP: post  hysterectomy   Past Surgeries: Laporotomy with lysis of adhesions, hysterectomy, c-section Relevant PMH: No tobacco, smokes marijuana  Loss of Income  Home blood sugars: not checking at home, can't afford strips CBG office 167 Denies symptomatic hypoglycemic-like events   Review of Systems See hpi    Objective:   Physical Exam  Constitutional: She is oriented to person, place, and time. She appears well-developed and well-nourished. She appears distressed (mild).  Cardiovascular: Normal rate and normal heart sounds.   Pulmonary/Chest: Effort normal and breath sounds normal.  Abdominal: Soft. Bowel sounds are normal. She exhibits no distension and no mass. There is tenderness (genral). There is no rebound and no guarding.  Neurological: She is alert and oriented to person, place, and time.  Skin: Skin is warm and dry.  Psychiatric: She has a normal mood and affect. Her behavior is normal. Judgment and thought content normal.   .tm    Assessment:     See problem list    Plan:     See problem list

## 2017-03-10 NOTE — Assessment & Plan Note (Addendum)
Acute illness with uncertain prognosis Established problem worsened.  Relatively unstable condition starting soon after last office visit in end of January. Most recent exacerbation couple days ago.   Continues to smoke marijuana which she says decreases her nausea.  Ran out of lorazepam couple weeks ago.  Onset of worsening of Nausea/Vomiting gastroparesis exacerbations with increase of work hours from part time to full time. Difficulty affording med copays with recent loss of SSI because of working at [employer] at [employer]  Aditional stress from [facility] because of taking away disability income because of work income and requirement ot pay back DHS from November.  Lives in [place] to care for grandchildren, was driing to work at [employer] in [place] several times a week.  I believe the stressors from increase in work hours, loss of income with subsequent hardship in affording copays for her medications for gastroparesis exacerbations, additional repayments to [facility]. Work note written stating that patient's current recurrent illnesses prevents her from returning to work.  She is to return to our office for reassessment of her illness and ability to work in 2 weeks.  Plan: Restart combination abortive therapy of lorazepam/oxycodone/phenergan/omeprazole. Slow reintroduction of sips and chips as tolerated.  If unimproved within two days or worsening, then advised Ms Tauzin to go to ED as she will likely need IV fluids for hydration and may need hospital admission to break current cycle of gastroparesis recurrences.

## 2017-04-21 ENCOUNTER — Encounter: Payer: Self-pay | Admitting: Family Medicine

## 2017-04-21 ENCOUNTER — Ambulatory Visit (INDEPENDENT_AMBULATORY_CARE_PROVIDER_SITE_OTHER): Payer: Medicare Other | Admitting: Family Medicine

## 2017-04-21 VITALS — BP 142/70 | HR 59 | Temp 98.1°F | Wt 135.0 lb

## 2017-04-21 DIAGNOSIS — R1115 Cyclical vomiting syndrome unrelated to migraine: Secondary | ICD-10-CM

## 2017-04-21 DIAGNOSIS — E1149 Type 2 diabetes mellitus with other diabetic neurological complication: Secondary | ICD-10-CM | POA: Diagnosis not present

## 2017-04-21 DIAGNOSIS — G8929 Other chronic pain: Secondary | ICD-10-CM

## 2017-04-21 DIAGNOSIS — K209 Esophagitis, unspecified without bleeding: Secondary | ICD-10-CM

## 2017-04-21 DIAGNOSIS — N2581 Secondary hyperparathyroidism of renal origin: Secondary | ICD-10-CM

## 2017-04-21 DIAGNOSIS — F1290 Cannabis use, unspecified, uncomplicated: Secondary | ICD-10-CM | POA: Diagnosis not present

## 2017-04-21 DIAGNOSIS — Z794 Long term (current) use of insulin: Secondary | ICD-10-CM

## 2017-04-21 DIAGNOSIS — I10 Essential (primary) hypertension: Secondary | ICD-10-CM | POA: Diagnosis not present

## 2017-04-21 DIAGNOSIS — E118 Type 2 diabetes mellitus with unspecified complications: Secondary | ICD-10-CM | POA: Diagnosis present

## 2017-04-21 DIAGNOSIS — M545 Low back pain, unspecified: Secondary | ICD-10-CM

## 2017-04-21 DIAGNOSIS — K5909 Other constipation: Secondary | ICD-10-CM

## 2017-04-21 DIAGNOSIS — K219 Gastro-esophageal reflux disease without esophagitis: Secondary | ICD-10-CM | POA: Diagnosis not present

## 2017-04-21 DIAGNOSIS — K5900 Constipation, unspecified: Secondary | ICD-10-CM | POA: Diagnosis not present

## 2017-04-21 DIAGNOSIS — K3184 Gastroparesis: Secondary | ICD-10-CM | POA: Diagnosis not present

## 2017-04-21 DIAGNOSIS — G43A1 Cyclical vomiting, intractable: Secondary | ICD-10-CM

## 2017-04-21 DIAGNOSIS — N184 Chronic kidney disease, stage 4 (severe): Secondary | ICD-10-CM | POA: Diagnosis not present

## 2017-04-21 LAB — POCT GLYCOSYLATED HEMOGLOBIN (HGB A1C): Hemoglobin A1C: 9

## 2017-04-21 MED ORDER — OXYCODONE HCL 5 MG PO TABS
5.0000 mg | ORAL_TABLET | ORAL | 0 refills | Status: DC | PRN
Start: 2017-04-21 — End: 2017-05-26

## 2017-04-21 MED ORDER — PREGABALIN 50 MG PO CAPS: ORAL_CAPSULE | ORAL | 3 refills | Status: DC

## 2017-04-21 MED ORDER — PANTOPRAZOLE SODIUM 20 MG PO TBEC: 20.0000 mg | DELAYED_RELEASE_TABLET | Freq: Every day | ORAL | 99 refills | Status: DC

## 2017-04-21 MED ORDER — PROMETHAZINE HCL 12.5 MG PO TABS: 12.5000 mg | ORAL_TABLET | Freq: Four times a day (QID) | ORAL | 3 refills | Status: DC | PRN

## 2017-04-21 NOTE — Assessment & Plan Note (Signed)
Established problem Uncontrollled Recent exacerbation that was managed successfully at home with simple sugars, fluids, phenergan Supp/Tabs, oxycodone/APAP for epigastric pain, and Lorazepam as antiemetic.  Refill of Percocet #30 today.  Refill Phenergan Tabs today

## 2017-04-21 NOTE — Assessment & Plan Note (Signed)
Wt Readings from Last 3 Encounters:  04/21/17 135 lb (61.2 kg)  03/10/17 137 lb (62.1 kg)  01/20/17 138 lb (62.6 kg)   Temp Readings from Last 3 Encounters:  04/21/17 98.1 F (36.7 C) (Oral)  03/10/17 97.7 F (36.5 C) (Oral)  01/20/17 98 F (36.7 C) (Oral)   BP Readings from Last 3 Encounters:  04/21/17 (!) 142/70  03/10/17 120/70  01/20/17 (!) 142/70   Pulse Readings from Last 3 Encounters:  04/21/17 (!) 59  03/10/17 61  01/20/17 66   Goal Standing BP < 130/80 given progressive CKD and moderate persistent albuminuria.

## 2017-04-21 NOTE — Assessment & Plan Note (Signed)
Established problem Controlled Continue Amitaza

## 2017-04-21 NOTE — Assessment & Plan Note (Addendum)
Nonspecific back pain Established problem worsened.  No Red flag findings or symptoms Recommend conservative measure: heat, massage, OMT, APAP prn If unimproved, add Baclofen.

## 2017-04-21 NOTE — Assessment & Plan Note (Signed)
Surveillance iPTH drawn today. If high, look for hyperphosphatemia, hypocalcemia or vitamin D deficiency. Uncertain of benefit of Vitamin D or its analogue in reducing PTH in non-HD patient.

## 2017-04-21 NOTE — Progress Notes (Signed)
Subjective:    Patient ID: [name] [name], female    DOB: Sep 25, 1966, 51 y.o.   MRN: [id number] [name] [name] is alone Sources of clinical information for visit is/are patient, past medical records and Care Everywhere. Nursing assessment for this office visit was reviewed with the patient for accuracy and revision.   HPI Problem List Items Addressed This Visit      High   Cyclic vomiting syndrome - Worsening around time of [place].  First full meal 04/17/17 - Last pecocet 04/18/17 - Taking Lorazepam prn  - Epigastric pain +    Marijuana smoker (HCC) - Smokes daily   DM neuropathies (HCC) - painful neuropathy bilateral feet   Relevant Medications   pregabalin (LYRICA) 50 MG capsule   CKD (chronic kidney disease) stage 4, GFR 15-29 ml/min ([place]) - Missed recent initial Outpatient Nephrology Consultation with Dr Joelyn Oms bc of N/V flare - Stopped taking Losartan bc she felt it casued nausea - calculated GFR: 14 - NSAIDS: No - ACEI/ARB: no - Diuretics: no - Bone Metabolism: Vitamin D - Potassium: not elevated in past - Acidosis: not low in past - PTH: ? - Anemia: 13.2 g/dL (11/17) - Voiding difficulties: no - Access: Port-a-cath.  No HD access. - Edema: no - Uremic symptoms (chronic n/v, sob, cp, confusion, itching)       Relevant Orders   Renal Function Panel   Phosphorus   PTH, Intact and Calcium   CBC   DM (diabetes mellitus), type 2 with complications (HCC) - Primary Disease Monitoring  Blood Sugar Ranges: fasting 140 -200  Polyuria: no   Visual problems: no   Medication Compliance: yes, Lantus 10 units [place] daily  Medication Side Effects  Hypoglycemia: no      Relevant Orders   HgB A1c (Completed)   Secondary hyperparathyroidism of renal origin ([place])     Medium   Encounter for chronic pain management   Essential hypertension   Disease Monitoring  Blood pressure range: 135-145/ 80s  Chest pain: no   Dyspnea: no   Claudication: no   Medication  compliance: Taking Carvedilol 6.25 mg BID, stopped Losartan bc she felt it caused nausea  Medication Side Effects  Lightheadedness: no   Urinary frequency: no   Edema: no       Low   GERD with esophagitis - Believes omeprazole gives her burning taste in her mouth. Not taking it.    Relevant Medications   pantoprazole (PROTONIX) 20 MG tablet   Chronic back pain Onset: Chroni with exacerbation in last few weeks Location: midline low back  Quality: aching  Pattern: Constant Course: recent worsening Worse with: prolonged standing  Better with: lying down  Radiation: into right buttock Trauma: nonrecalled Best sitting/standing/leaning forward: worse leaning forward  Red Flags Fecal/urinary incontinence: no  Numbness/Weakness: no  Fever/chills/sweats: no  Night pain: no  Unexplained weight loss: no  No relief with bedrest: no  h/o cancer/immunosuppression: no  IV drug use: no  PMH of osteoporosis or chronic steroid use: Possible metabolic bone disease from CKD IV      Relevant Medications   oxyCODONE (OXY IR/ROXICODONE) 5 MG immediate release tablet   Intermittent constipation Longstanding problem Improved with use of Amitiza    Other Visit Diagnoses    Gastroparesis       Relevant Medications   oxyCODONE (OXY IR/ROXICODONE) 5 MG immediate release tablet     SH: Has gotten back her Disability SSI Smokning marijuana daily   Review of  Systems See HPI    Objective:   Physical Exam VS reviewed GEN: Alert, Cooperative, Groomed, NAD, tearful when talking about kidney failure HEENT: PERRL; EAC bilaterally not occluded, TM's translucent with normal LM, (+) LR            COR: RRR, No M/G/R, No JVD, Normal PMI size and location LUNGS: BCTA, No Acc mm use, speaking in full sentences EXT: No peripheral leg edema.  Diabetic Foot Exam - Simple   Simple Foot Form Diabetic Foot exam was performed with the following findings:  Yes 04/21/2017 10:00 PM  Visual Inspection No  deformities, no ulcerations, no other skin breakdown bilaterally:  Yes Sensation Testing Pulse Check Posterior Tibialis and Dorsalis pulse intact bilaterally:  Yes Comments Hammer toes bilateral.  No skin breakdown. 2/5 right to monofil 3/5 left to monofil       SKIN: No lesion nor rashes of face/trunk/extremities Neuro: Oriented to person, place, and time;  DTR: Bilateral Knees 2+, Bilateral Ankles 0. Down going toes bil Gait: Normal speed, No significant path deviation, Step through +,  Psych: Normal affect/thought/speech/language    Assessment & Plan:

## 2017-04-21 NOTE — Assessment & Plan Note (Addendum)
Established problem worsened.  Lab Results  Component Value Date   HGBA1C 9.0 04/21/2017  Worsened glycemic control form 01/20/17 (A1c 6.8%) Wt Readings from Last 3 Encounters:  04/21/17 135 lb (61.2 kg)  03/10/17 137 lb (62.1 kg)  01/20/17 138 lb (62.6 kg)   Recent stressor of prolonged Cyclic Vomiting event  No overt infection symptoms, denies insulin nonadherence, no exercise,   In setting of progressive CKD IV-A2, am hesitant to increase the Lantus much.  Will consult with Dr [name] PharmD. Is a low dose of rapid onset prandial Insulin if patient keeps down a meal likely to lower A1c safely in this pt progressing in their CKD towards ESRD.  Recommended increase of daily Lantus from 10 units to 12 units and then alternating consultations with Dr [name] and Verita Lamb until pt returns to adequate glycemic control of < 8.0%.

## 2017-04-21 NOTE — Assessment & Plan Note (Signed)
Established problem worsened.  Pt believes that omeprazole causingng brash water Using omeprazole on-demand rather than the recommended continuous given her hx of severe esophagitis on EGD.   Plan: Trial of Protonix 20 mg daily.

## 2017-04-21 NOTE — Addendum Note (Signed)
Addended by: Lissa Morales D on: 04/21/2017 03:23 PM   Modules accepted: Orders

## 2017-04-21 NOTE — Assessment & Plan Note (Signed)
Established problem. Stable. Continue current therapy of Lyrica at Renal-dose of 50 mg daily

## 2017-04-21 NOTE — Patient Instructions (Signed)
We are checking your Kidney function today. We will reschedule your appointment with the Kidney Doctors. Increase Lantus to 12 units daily Return to [facility] to see Dr [name] (Pharm D.) for co-management of your diabetes. Your A1c was 9.0% today.  I would like to see it less than 8.0%.  Continue to not take the Losartan.  The Kidney doctors can recommend whether or not you should take a medication like Losratan that will not make you sick.

## 2017-04-21 NOTE — Assessment & Plan Note (Signed)
Needs mammogram and colonoscopy for CRC.

## 2017-04-21 NOTE — Assessment & Plan Note (Addendum)
Established problem worsened.  Surveillance eGFR with Phosphorus iPTH, Hgb, HCO3 Re-referral to Nephrology for planning Renal Replacement Therapy.  Risk of progression ESRD moderate.  Continue holding losartan until Nephrology evaluates.

## 2017-04-21 NOTE — Assessment & Plan Note (Signed)
Established problem. Stable. Continue broken record counseling that marijuana associated with n/v.  Pt believes it is acting as an anti-emetic for her.

## 2017-04-22 ENCOUNTER — Telehealth: Payer: Self-pay | Admitting: Family Medicine

## 2017-04-22 NOTE — Telephone Encounter (Signed)
Informed Ms Paglia of the improvement in her SCr.  Reiterated importance of her attending initial consultation with Nephrology.

## 2017-04-23 LAB — RENAL FUNCTION PANEL
Albumin: 4.5 g/dL (ref 3.5–5.5)
BUN / CREAT RATIO: 12 (ref 9–23)
BUN: 27 mg/dL — AB (ref 6–24)
CHLORIDE: 101 mmol/L (ref 96–106)
CO2: 18 mmol/L (ref 18–29)
Calcium: 9.8 mg/dL (ref 8.7–10.2)
Creatinine, Ser: 2.21 mg/dL — ABNORMAL HIGH (ref 0.57–1.00)
GFR, EST AFRICAN AMERICAN: 29 mL/min/{1.73_m2} — AB (ref >59)
GFR, EST NON AFRICAN AMERICAN: 25 mL/min/{1.73_m2} — AB (ref >59)
Glucose: 212 mg/dL — ABNORMAL HIGH (ref 65–99)
Phosphorus: 3.7 mg/dL (ref 2.5–4.5)
Potassium: 5.2 mmol/L (ref 3.5–5.2)
Sodium: 140 mmol/L (ref 134–144)

## 2017-04-23 LAB — CBC
HEMATOCRIT: 33.9 % — AB (ref 34.0–46.6)
HEMOGLOBIN: 11.1 g/dL (ref 11.1–15.9)
MCH: 30.7 pg (ref 26.6–33.0)
MCHC: 32.7 g/dL (ref 31.5–35.7)
MCV: 94 fL (ref 79–97)
PLATELETS: 265 10*3/uL (ref 150–379)
RBC: 3.61 x10E6/uL — AB (ref 3.77–5.28)
RDW: 15.1 % (ref 12.3–15.4)
WBC: 6.8 10*3/uL (ref 3.4–10.8)

## 2017-04-23 LAB — PTH, INTACT AND CALCIUM

## 2017-04-25 ENCOUNTER — Encounter: Payer: Self-pay | Admitting: Family Medicine

## 2017-05-02 DIAGNOSIS — R809 Proteinuria, unspecified: Secondary | ICD-10-CM | POA: Diagnosis not present

## 2017-05-02 DIAGNOSIS — N183 Chronic kidney disease, stage 3 (moderate): Secondary | ICD-10-CM | POA: Diagnosis not present

## 2017-05-02 DIAGNOSIS — I10 Essential (primary) hypertension: Secondary | ICD-10-CM | POA: Diagnosis not present

## 2017-05-02 DIAGNOSIS — N2581 Secondary hyperparathyroidism of renal origin: Secondary | ICD-10-CM | POA: Diagnosis not present

## 2017-05-26 ENCOUNTER — Ambulatory Visit (INDEPENDENT_AMBULATORY_CARE_PROVIDER_SITE_OTHER): Payer: Medicare Other | Admitting: Family Medicine

## 2017-05-26 ENCOUNTER — Encounter: Payer: Self-pay | Admitting: Family Medicine

## 2017-05-26 VITALS — BP 142/56 | HR 68 | Temp 98.6°F | Ht 61.0 in | Wt 137.0 lb

## 2017-05-26 DIAGNOSIS — E1149 Type 2 diabetes mellitus with other diabetic neurological complication: Secondary | ICD-10-CM

## 2017-05-26 DIAGNOSIS — L84 Corns and callosities: Secondary | ICD-10-CM

## 2017-05-26 DIAGNOSIS — E118 Type 2 diabetes mellitus with unspecified complications: Secondary | ICD-10-CM | POA: Diagnosis not present

## 2017-05-26 DIAGNOSIS — Z794 Long term (current) use of insulin: Secondary | ICD-10-CM | POA: Diagnosis not present

## 2017-05-26 DIAGNOSIS — G43A0 Cyclical vomiting, not intractable: Secondary | ICD-10-CM

## 2017-05-26 DIAGNOSIS — K3184 Gastroparesis: Secondary | ICD-10-CM | POA: Diagnosis not present

## 2017-05-26 DIAGNOSIS — Z1211 Encounter for screening for malignant neoplasm of colon: Secondary | ICD-10-CM

## 2017-05-26 DIAGNOSIS — I10 Essential (primary) hypertension: Secondary | ICD-10-CM

## 2017-05-26 MED ORDER — OXYCODONE HCL 5 MG PO TABS: 5.0000 mg | ORAL_TABLET | ORAL | 0 refills | Status: DC | PRN

## 2017-05-26 NOTE — Patient Instructions (Signed)
Dr Kinlee Garrison will arrange your consultations with gastroenterology for colon cancer screening colonoscopy and try again to get you an appointment with Podiatry to work on your feet and toenails.   If you morning blood sugars are less than 100.  Decrease your Lantus insulin by one unit.

## 2017-05-27 DIAGNOSIS — Z1211 Encounter for screening for malignant neoplasm of colon: Secondary | ICD-10-CM

## 2017-05-27 DIAGNOSIS — L84 Corns and callosities: Secondary | ICD-10-CM | POA: Insufficient documentation

## 2017-05-27 HISTORY — DX: Corns and callosities: L84

## 2017-05-27 NOTE — Assessment & Plan Note (Signed)
Pt's gastroparesis has been stable to allow consideration of screening CRC colonscopy.  Referral to [facility] for CRC screening.

## 2017-05-27 NOTE — Assessment & Plan Note (Signed)
Established problem that has improved by reported CBGs with increase Lantus to 12 units  As pt GFR is improved, will continuewith Lantus 12 units daily. Will Hold off on consultation with Dr [name] for moment.  A1c 9.6% in April. Check A1c on or after 07/21/17

## 2017-05-27 NOTE — Assessment & Plan Note (Signed)
New problem Bilateral calluses over 1st and 5thMT heads bilaterally with evidence of prior surrounding inflammation.  (+) painful peripheral diabetic neuropathy Referral to podiatry for evaluation and treatment. Deep diabetic shoes with inserts.

## 2017-05-27 NOTE — Progress Notes (Signed)
   Subjective:    Patient ID: [name] [name], female    DOB: 08-20-1966, 51 y.o.   MRN: [id number] [name] [name] is alone Sources of clinical information for visit is/are patient and past medical records. Nursing assessment for this office visit was reviewed with the patient for accuracy and revision.   HPI CHRONIC DIABETES  Disease Monitoring  Blood Sugar Ranges: Lantus 12 u daily.  Took only3 units this morning because CBG 88.  High CBG 230, Low CBG 88, usual in 100s.  Diet: has been able to eat more regularlylatleyas Gastroparesis has been less acitve problem.   Polyuria: no   Visual problems: no   Medication Compliance: yes  Medication Side Effects  Hypoglycemia: no   Preventitive Health Care             Daily Aspirin: yes             Statin: no bc of gi intol             Recent eGFR: 29 ml/min (04/21/17)             ACEI: No. Approved by Renal physicians.  Eye Exam: pending  Foot Exam: done  Diet pattern: fair  Exercise: none   SH: no smoking tobacco. (+) smoke marijuana. Med list reivewed withpatient and updated  Review of Systems No N/V No dental pain.    Objective:   Physical Exam VS reviewed GEN: Alert, Cooperative, Groomed, NAD, upbeat affect today COR: RRR, No M/G/R, No JVD, Normal PMI size and location LUNGS: BCTA, No Acc mm use, speaking in full sentences ABDOMEN: (+)BS, soft, NT, ND, No HSM, No palpable masses EXT: No peripheral leg edema.  Neuro: Oriented to person, place, and time;  Gait: Normal speed, No significant path deviation, Step through +,  Psych: Normal affect/thought/speech/language    Diabetic Foot Exam - Simple   Simple Foot Form Diabetic Foot exam was performed with the following findings:  Yes 05/26/2017 10:03 AM  Visual Inspection See comments:  Yes Sensation Testing See comments:  Yes Pulse Check Comments Hammer toes Preulcerative calluses over sole 5th and 1st metatarsal heads bilaterally Decreased sensation monofilament  for all 5 points soles ofbilateral feet     Assessment & Plan:

## 2017-05-27 NOTE — Assessment & Plan Note (Signed)
Adequate blood pressure control.  No evidence of new end organ damage.  Tolerating medication without significant adverse effects.  Plan to continue current blood pressure regiment of carvedilol.  Renal consultation agrees with holding off ACEI/ARB.

## 2017-05-27 NOTE — Assessment & Plan Note (Signed)
Established problem. Stable. Continue current therapy prn with exacerbations. Continue daily full dose PPI given history of erosive esophagitis on EGD

## 2017-05-27 NOTE — Assessment & Plan Note (Signed)
Established problem. Stable. Continue current therapy with gabapentin for pain

## 2017-05-31 ENCOUNTER — Telehealth: Payer: Self-pay | Admitting: Family Medicine

## 2017-05-31 NOTE — Telephone Encounter (Signed)
Patient is aware that script will be ready tomorrow afternoon and I advised her to call them first before heading over there. [name],CMA

## 2017-05-31 NOTE — Telephone Encounter (Signed)
Pt states the pharmacy does not have lorazepam 0.5 and pt needs this changed to something else or a different dose. Pt uses [facility] on [address]. ep

## 2017-05-31 NOTE — Telephone Encounter (Signed)
Please let Ms Reaume know that the lorazepam 0.5 mg tablet will be available at [facility] on [address] by noon tomorrow, 06/01/17.

## 2017-06-08 ENCOUNTER — Telehealth: Payer: Self-pay | Admitting: Family Medicine

## 2017-06-08 NOTE — Telephone Encounter (Signed)
Pt called because she is still sick and the medication that she is taking for her Nausea is not working. She is in the bed and can not get out. She would like to speak to Dr. McDiarmid about this and she also said that while she is in the hospital the gave her a different type of medication for her nausea and wanted to know if the doctor can look on her chart and call this in for her. jw

## 2017-06-08 NOTE — Telephone Encounter (Signed)
Will forward to MD to advise. [name],CMA  

## 2017-06-14 ENCOUNTER — Encounter (HOSPITAL_COMMUNITY): Payer: Self-pay | Admitting: Emergency Medicine

## 2017-06-14 ENCOUNTER — Emergency Department (HOSPITAL_COMMUNITY)
Admission: EM | Admit: 2017-06-14 | Discharge: 2017-06-14 | Disposition: A | Discharge status: Home / Self Care | Payer: Medicare Other | Attending: Emergency Medicine | Admitting: Emergency Medicine

## 2017-06-14 DIAGNOSIS — N183 Chronic kidney disease, stage 3 unspecified: Secondary | ICD-10-CM

## 2017-06-14 DIAGNOSIS — R1013 Epigastric pain: Secondary | ICD-10-CM | POA: Diagnosis not present

## 2017-06-14 DIAGNOSIS — I129 Hypertensive chronic kidney disease with stage 1 through stage 4 chronic kidney disease, or unspecified chronic kidney disease: Secondary | ICD-10-CM | POA: Diagnosis not present

## 2017-06-14 DIAGNOSIS — Z7982 Long term (current) use of aspirin: Secondary | ICD-10-CM | POA: Insufficient documentation

## 2017-06-14 DIAGNOSIS — R1115 Cyclical vomiting syndrome unrelated to migraine: Secondary | ICD-10-CM

## 2017-06-14 DIAGNOSIS — E1140 Type 2 diabetes mellitus with diabetic neuropathy, unspecified: Secondary | ICD-10-CM | POA: Insufficient documentation

## 2017-06-14 DIAGNOSIS — G43A0 Cyclical vomiting, not intractable: Secondary | ICD-10-CM | POA: Diagnosis not present

## 2017-06-14 DIAGNOSIS — Z794 Long term (current) use of insulin: Secondary | ICD-10-CM | POA: Insufficient documentation

## 2017-06-14 DIAGNOSIS — E1143 Type 2 diabetes mellitus with diabetic autonomic (poly)neuropathy: Secondary | ICD-10-CM | POA: Insufficient documentation

## 2017-06-14 DIAGNOSIS — I252 Old myocardial infarction: Secondary | ICD-10-CM | POA: Diagnosis not present

## 2017-06-14 DIAGNOSIS — E1165 Type 2 diabetes mellitus with hyperglycemia: Secondary | ICD-10-CM | POA: Insufficient documentation

## 2017-06-14 DIAGNOSIS — E1122 Type 2 diabetes mellitus with diabetic chronic kidney disease: Secondary | ICD-10-CM | POA: Insufficient documentation

## 2017-06-14 DIAGNOSIS — F1290 Cannabis use, unspecified, uncomplicated: Secondary | ICD-10-CM

## 2017-06-14 DIAGNOSIS — K3184 Gastroparesis: Secondary | ICD-10-CM

## 2017-06-14 DIAGNOSIS — R112 Nausea with vomiting, unspecified: Secondary | ICD-10-CM | POA: Diagnosis present

## 2017-06-14 DIAGNOSIS — M545 Low back pain, unspecified: Secondary | ICD-10-CM

## 2017-06-14 DIAGNOSIS — F17210 Nicotine dependence, cigarettes, uncomplicated: Secondary | ICD-10-CM | POA: Insufficient documentation

## 2017-06-14 DIAGNOSIS — Z79899 Other long term (current) drug therapy: Secondary | ICD-10-CM | POA: Insufficient documentation

## 2017-06-14 DIAGNOSIS — R109 Unspecified abdominal pain: Secondary | ICD-10-CM

## 2017-06-14 LAB — COMPREHENSIVE METABOLIC PANEL
ALBUMIN: 4 g/dL (ref 3.5–5.0)
ALK PHOS: 75 U/L (ref 38–126)
ALT: 13 U/L — AB (ref 14–54)
AST: 17 U/L (ref 15–41)
Anion gap: 10 (ref 5–15)
BUN: 29 mg/dL — ABNORMAL HIGH (ref 6–20)
CO2: 26 mmol/L (ref 22–32)
Calcium: 9.9 mg/dL (ref 8.9–10.3)
Chloride: 103 mmol/L (ref 101–111)
Creatinine, Ser: 2.34 mg/dL — ABNORMAL HIGH (ref 0.44–1.00)
GFR calc non Af Amer: 23 mL/min — ABNORMAL LOW (ref >60)
GFR, EST AFRICAN AMERICAN: 26 mL/min — AB (ref >60)
GLUCOSE: 287 mg/dL — AB (ref 65–99)
Potassium: 4.2 mmol/L (ref 3.5–5.1)
SODIUM: 139 mmol/L (ref 135–145)
TOTAL PROTEIN: 7.2 g/dL (ref 6.5–8.1)
Total Bilirubin: 0.3 mg/dL (ref 0.3–1.2)

## 2017-06-14 LAB — URINALYSIS, ROUTINE W REFLEX MICROSCOPIC
BACTERIA UA: NONE SEEN
BILIRUBIN URINE: NEGATIVE
Glucose, UA: >=500 mg/dL — AB
KETONES UR: NEGATIVE mg/dL
LEUKOCYTES UA: NEGATIVE
Nitrite: NEGATIVE
PROTEIN: 30 mg/dL — AB
Specific Gravity, Urine: 1.007 (ref 1.005–1.030)
WBC UA: NONE SEEN WBC/hpf (ref 0–5)
pH: 7 (ref 5.0–8.0)

## 2017-06-14 LAB — CBC
HEMATOCRIT: 33 % — AB (ref 36.0–46.0)
HEMOGLOBIN: 11.1 g/dL — AB (ref 12.0–15.0)
MCH: 30.7 pg (ref 26.0–34.0)
MCHC: 33.6 g/dL (ref 30.0–36.0)
MCV: 91.2 fL (ref 78.0–100.0)
Platelets: 272 10*3/uL (ref 150–400)
RBC: 3.62 MIL/uL — AB (ref 3.87–5.11)
RDW: 13.8 % (ref 11.5–15.5)
WBC: 13.3 10*3/uL — AB (ref 4.0–10.5)

## 2017-06-14 LAB — RAPID URINE DRUG SCREEN, HOSP PERFORMED
Amphetamines: NOT DETECTED
BARBITURATES: NOT DETECTED
Benzodiazepines: NOT DETECTED
COCAINE: NOT DETECTED
OPIATES: NOT DETECTED
Tetrahydrocannabinol: POSITIVE — AB

## 2017-06-14 LAB — LIPASE, BLOOD: Lipase: 75 U/L — ABNORMAL HIGH (ref 11–51)

## 2017-06-14 MED ORDER — ONDANSETRON HCL 4 MG/2ML IJ SOLN: 4.0000 mg | Freq: Once | INTRAMUSCULAR | Status: DC

## 2017-06-14 MED ORDER — METOCLOPRAMIDE HCL 10 MG PO TABS: 10.0000 mg | ORAL_TABLET | Freq: Four times a day (QID) | ORAL | 0 refills | Status: DC

## 2017-06-14 MED ORDER — SODIUM CHLORIDE 0.9 % IV BOLUS (SEPSIS)
1000.0000 mL | Freq: Once | INTRAVENOUS | Status: AC
  Administered 2017-06-14: 1000 mL via INTRAVENOUS

## 2017-06-14 MED ORDER — HALOPERIDOL LACTATE 5 MG/ML IJ SOLN
4.0000 mg | Freq: Once | INTRAMUSCULAR | Status: AC
  Administered 2017-06-14: 4 mg via INTRAVENOUS
  Filled 2017-06-14: qty 1

## 2017-06-14 MED ORDER — METOCLOPRAMIDE HCL 5 MG/ML IJ SOLN
10.0000 mg | Freq: Once | INTRAMUSCULAR | Status: AC
  Administered 2017-06-14: 10 mg via INTRAVENOUS
  Filled 2017-06-14: qty 2

## 2017-06-14 MED ORDER — FAMOTIDINE IN NACL 20-0.9 MG/50ML-% IV SOLN
20.0000 mg | Freq: Once | INTRAVENOUS | Status: AC
  Administered 2017-06-14: 20 mg via INTRAVENOUS
  Filled 2017-06-14: qty 50

## 2017-06-14 MED ORDER — MORPHINE SULFATE (PF) 4 MG/ML IV SOLN
4.0000 mg | Freq: Once | INTRAVENOUS | Status: AC
  Administered 2017-06-14: 4 mg via INTRAVENOUS
  Filled 2017-06-14: qty 1

## 2017-06-14 MED ORDER — OXYCODONE HCL 5 MG PO TABS: 5.0000 mg | ORAL_TABLET | Freq: Four times a day (QID) | ORAL | 0 refills | Status: DC | PRN

## 2017-06-14 MED ORDER — MORPHINE SULFATE (PF) 4 MG/ML IV SOLN
4.0000 mg | Freq: Once | INTRAVENOUS | Status: DC
  Filled 2017-06-14: qty 1

## 2017-06-14 NOTE — ED Notes (Signed)
EDP at bedside  

## 2017-06-14 NOTE — ED Notes (Signed)
Pt. Tolerating oral fluids at this time. EDP made aware.

## 2017-06-14 NOTE — Discharge Instructions (Signed)
Take Reglan for nausea as needed (this helps with gastroparesis) Take pain medicine as needed Follow up with your doctor Return for worsening symptoms

## 2017-06-14 NOTE — ED Notes (Addendum)
Pt. Continues to vomit and is diaphoretic. Pt. States abd. Pain 10/10. BP high. EDP made aware.

## 2017-06-14 NOTE — ED Triage Notes (Signed)
Patient reports persistent low back pain with emesis and generalized abdominal pain for several days worse this morning , denies diarrhea or fever.

## 2017-06-14 NOTE — ED Provider Notes (Signed)
[facility] DEPT Provider Note   CSN: [id number] Arrival date & time: 06/14/17  0537   History   Chief Complaint Chief Complaint  Patient presents with  . Emesis  . Back Pain    HPI [name] [name] is a 51 y.o. female who presents with N/V. PMH significant for uncontrolled insulin dependent DM with hx of gastroparesis, hx of DKA, CKD, chronic abdominal pain, cyclical vomiting syndrome with THC use, esophagitis. She states that she's had low back pain and left-sided flank pain for the past several days. The pain starts in her low back and radiates to her left flank and left side of her abdomen. This morning she woke up at about 3 AM due to the pain. Nothing makes it better or worse. Since then she's had multiple episodes of nausea and vomiting. She denies any fever but reports diaphoresis. She denies any chest pain or shortness of breath. No urinary symptoms. She is prescribed Phenergan and phenergan suppositories which she has been using with minimal relief.   HPI  Past Medical History:  Diagnosis Date  . Abdominal pain, generalized   . Acquired bilateral hammer toes 01/20/2017  . Anemia   . Anxiety   . At risk for polypharmacy 08/15/2014  . Biceps tendinopathy of right upper extremity 04/05/2014  . Blood transfusion   . Chronic generalized abdominal pain   . Chronic generalized abdominal pain   . Chronic kidney disease (CKD), stage III (moderate)   . Chronic leg cramping 07/18/2014  . CKD (chronic kidney disease) 08/17/2013   Seen by Dr Joelyn Oms 07/28/15 ([place]) - (see scanned document): 20 years diabetes, 15 years HTN, stable CKD stage III with proteinuria.  - CKD, discussed NSAID avoidance, diabetes control with target A1c<7.5%, and HTN control with SBP<140. F/u 6 months and repeat renal panel at that time. - Microalbuminuria: On lisinopril 62m, suspected secondary due to diabetic nephropathy. - Chronic back pain: No NSDAIDs. - Say amlodipine causes nausea  - Advised  not to ACE-i during vomiting episodes    . Complication of anesthesia    "problems waking up"  . Cyclic vomiting syndrome   . Delayed gastric emptying 05/12/2011    04/12/11 NUCLEAR MEDICINE GASTRIC EMPTYING SCAN [employer]: 2.0 mCi [id number] colloid. Comparison: None. Findings: At 1 hour, 81% of the counts remain in the stomach. At 2 hours, 53% remain. Normal is less than 30% at 2 hours. IMPRESSION: Moderate delay in gastric emptying.      . Depression   . Depression with anxiety 02/23/2007   See [facility] Note dated 05/06/2016.  IC (KGwenlyn Saranwas called in and I did a brief assessment.   . Diabetic gastroparesis (HCRichey   /e-chart  . Diabetic ketoacidosis without coma associated with type 2 diabetes mellitus (HCUpson  . DKA (diabetic ketoacidoses) (HCLamoille8/07/2016  . Dysuria 10/01/2016  . Elevated cholesterol 11/28/2015  . Emotional stress 12/30/2015   Stress after Breakup with partnes of 13 years on 12/27/2015   . Encounter for chronic pain management 01/06/2015   Indication for chronic opioid: Chronic back pain, cyclic vomiting Medication and dose: oxycodone 63m37m# pills per month: 15 Last UDS date: 05/08/2015 - result pending Pain contract signed (Y/N): Yes, reviewed 01/06/2015  Date narcotic database last reviewed (include red flags): 05/08/2015 (no red flags).    . Esophagitis 04/18/2013   Documented by Dr. PerHenrene PastorD 03/2011  He also consulted on her during hospitalization 03/07/8675r cyclical vomiting. No repeat EGD necessary,  treat esophagitis with PPI.    Marland Kitchen Essential hypertension 05/01/2016  . Essential hypertension, malignant 02/23/2007   Jan 2015 - stopped recently-started norvasc due to reports of GI symptoms. 02/2015 - holding lisinopril, adding hydralazine 56m TID, and will take 1/2 tab lisinopril when feeling better when not in cyclic vomiting flare   . Essential hypertension, malignant 02/23/2007   Jan 2015 - stopped recently-started norvasc due to reports of GI symptoms.  02/2015 - holding lisinopril, adding hydralazine 185mTID, and will take 1/2 tab lisinopril when feeling better when not in cyclic vomiting flare   . GERD (gastroesophageal reflux disease)   . Heart murmur   . Hematuria 07/09/2015  . High risk social situation 08/31/2012  . Hot flashes 07/29/2011  . Hypertension   . INSOMNIA NOS 02/23/2007   Qualifier: Diagnosis of  By: HaBeryle Lathe  . Intermittent constipation 06/06/2015  . Intestinal impaction (HCEscambia  . Intractable vomiting 07/02/2016  . Marijuana smoker, continuous   . MIGRAINE, UNSPEC., W/O INTRACTABLE MIGRAINE 02/23/2007   Qualifier: Diagnosis of  By: HaBeryle Lathe  . Myocardial infarction ([facility]8/2008   "they say I've had a silent one; I don't know"  . Nausea and vomiting in adult 06/06/2013  . NEUROPATHY, DIABETIC 02/23/2007  . Nonspecific low back pain 04/10/2007   Back pain is chronic. Oxycodone per prior notes helps pt be more active. Reportedly uses 4 tab/week oxy for intermittent worsening of chronic back pain but mostly for cyclic vom to keep her out of hospital.    . Normocytic anemia   . Onychomycosis 03/25/2015  . PANCREATITIS 05/09/2008  . Pneumonia   . Protein-calorie malnutrition, severe (HCMovico12/22/2014  . Pure hypercholesterolemia 08/15/2014   Based on her choleserol, BP, smoker status, and that she is a diabetic, her 10 year ASCVD risk is 9.9% putting her in a category of risk that would benefit from high-intensity statin.    . Rotator cuff syndrome 04/19/2014  . Smoker   . TOBACCO DEPENDENCE 02/23/2007   2 cigarettes every few days 11/2012 3 cigarettes daily    . Type II diabetes mellitus (HCWestfield  . Uncontrolled secondary diabetes mellitus with stage 3 CKD (GFR 30-59) (HCC) 10/23/2011   Nephropathy, gastroparesis  HgbA1c (06/2011) 7.6, 9.5, 12.2, 8.2, 10.9 (07/2012), 9.2 (02/11/2013), 7.7 (09/27/13)  Metformin: she has a history of cyclical vomiting; she would prefer not to take this medication  On lanuts 20U qam with  improved control. 09/27/13   . Vitamin D deficiency 11/28/2014    Patient Active Problem List   Diagnosis Date Noted  . Pre-ulcerative calluses 05/27/2017  . Screening for colon cancer 05/27/2017  . Primary income source is welfare benefits 03/10/2017  . Acquired bilateral hammer toes 01/20/2017  . Port-a-cath in place 01/20/2017  . Secondary hyperparathyroidism of renal origin (HCKeswick10/09/2016  . Recurrent major depressive disorder (HCRichwood  . Essential hypertension 05/01/2016  . Diabetic gastroparesis associated with type 2 diabetes mellitus (HCWakefield05/05/2016  . DM (diabetes mellitus), type 2 with complications (HCWalterhill  . Normocytic anemia   . Intermittent constipation 06/06/2015  . Healthcare maintenance 01/06/2015  . Vitamin D deficiency 11/28/2014  . Pure hypercholesterolemia 08/15/2014  . Chronic leg cramping 07/18/2014  . Chronic kidney disease (CKD) stage G4/A2, severely decreased glomerular filtration rate (GFR) between 15-29 mL/min/1.73 square meter and albuminuria creatinine ratio between 30-299 mg/g (HCC) 08/17/2013  . Esophagitis determined by endoscopy 04/18/2013  . Allergic rhinitis, seasonal 05/04/2012  .  Marijuana smoker ([place]) 11/03/2011  . Cyclic vomiting syndrome 08/03/2011  . Nonspecific low back pain 04/10/2007  . Anxiety disorder 02/23/2007  . TOBACCO DEPENDENCE 02/23/2007  . DM neuropathies ([place]) 02/23/2007  . GERD with esophagitis 01/27/2004    Past Surgical History:  Procedure Laterality Date  . ABDOMINAL HYSTERECTOMY  02/2002  . CARDIAC CATHETERIZATION    . CATARACT EXTRACTION W/ INTRAOCULAR LENS  IMPLANT, BILATERAL    . CESAREAN SECTION    . CHOLECYSTECTOMY  1980's  . COLONOSCOPY    . DILATION AND EVACUATION  X3  . laparotomy and lysis of adhesions    . left hand surgery    . PORT-A-CATH placement  ~ 2008   right chest; "poor access; frequent hospitalizations"  . SMALL INTESTINE SURGERY     laporotomy with lysis of adhesions.  Marland Kitchen UPPER GI ENDOSCOPY       OB History    Gravida Para Term Preterm AB Living   '7       3 4   ' SAB TAB Ectopic Multiple Live Births     3             Home Medications    Prior to Admission medications   Medication Sig Start Date End Date Taking? Authorizing Provider  aspirin 81 MG tablet Take 1 tablet (81 mg total) by mouth daily. 11/26/16   McDiarmid, Blane Ohara, MD  Blood Glucose Monitoring Suppl (ONE TOUCH ULTRA SYSTEM KIT) w/Device KIT 1 kit by Does not apply route 2 (two) times daily before a meal. 01/20/17   McDiarmid, Blane Ohara, MD  carvedilol (COREG) 6.25 MG tablet Take 1 tablet (6.25 mg total) by mouth 2 (two) times daily with a meal. 03/10/17   McDiarmid, Blane Ohara, MD  Insulin Glargine (LANTUS SOLOSTAR) 100 UNIT/ML Solostar Pen Inject 12 Units into the skin daily at 10 pm. 04/21/17   McDiarmid, Blane Ohara, MD  Insulin Pen Needle (PEN NEEDLES) 30G X 5 MM MISC 10 Units by Does not apply route daily. 12/02/16   McDiarmid, Blane Ohara, MD  LORazepam (ATIVAN) 0.5 MG tablet Take 1 tablet (0.5 mg total) by mouth every 6 (six) hours as needed for anxiety. 03/10/17   McDiarmid, Blane Ohara, MD  lubiprostone (AMITIZA) 24 MCG capsule Take 1 capsule (24 mcg total) by mouth 2 (two) times daily with a meal. 03/10/17   McDiarmid, Blane Ohara, MD  oxyCODONE (OXY IR/ROXICODONE) 5 MG immediate release tablet Take 1 tablet (5 mg total) by mouth every 4 (four) hours as needed for severe pain. 05/26/17   McDiarmid, Blane Ohara, MD  pantoprazole (PROTONIX) 20 MG tablet Take 1 tablet (20 mg total) by mouth daily. 04/21/17   McDiarmid, Blane Ohara, MD  pregabalin (LYRICA) 50 MG capsule TAKE 1 TABLET BY MOUTH AT BEDTIME daily 04/21/17   McDiarmid, Blane Ohara, MD  promethazine (PHENERGAN) 12.5 MG tablet Take 1 tablet (12.5 mg total) by mouth every 6 (six) hours as needed for nausea or vomiting. 04/21/17   McDiarmid, Blane Ohara, MD  promethazine (PHENERGAN) 25 MG suppository Place 1 suppository (25 mg total) rectally every 6 (six) hours as needed for nausea or vomiting. 03/10/17    McDiarmid, Blane Ohara, MD    Family History Family History  Problem Relation Age of Onset  . Diabetes type II Mother   . Diabetes Mother   . Hypertension Mother   . Diabetes type II Sister   . Diabetes Sister   . Hypertension Sister   . Hypertension Brother  Social History Social History  Substance Use Topics  . Smoking status: Current Some Day Smoker    Packs/day: 0.50    Years: 22.00    Types: Cigarettes    Start date: 12/27/1982  . Smokeless tobacco: Never Used     Comment: THC 1-2 times per day almost daily  . Alcohol use No     Allergies   Bee venom; Tegaderm ag mesh [silver]; Acetaminophen; Novolog [insulin aspart]; Ceftriaxone; and Erythromycin   Review of Systems Review of Systems  Constitutional: Negative for fever.  Respiratory: Negative for shortness of breath.   Cardiovascular: Negative for chest pain.  Gastrointestinal: Positive for abdominal pain, nausea and vomiting. Negative for constipation and diarrhea.  Genitourinary: Negative for dysuria and vaginal discharge.  Musculoskeletal: Positive for back pain.  All other systems reviewed and are negative.    Physical Exam Updated Vital Signs BP (!) 171/101 (BP Location: Left Arm)   Pulse 83   Temp 98.6 F (37 C) (Oral)   Resp 17   Ht 5' 1.5" (1.562 m)   Wt 62.1 kg (137 lb)   SpO2 100%   BMI 25.47 kg/m   Physical Exam  Constitutional: She is oriented to person, place, and time. She appears well-developed and well-nourished. She appears distressed (dry heaving).  HENT:  Head: Normocephalic and atraumatic.  Eyes: Conjunctivae are normal. Pupils are equal, round, and reactive to light. Right eye exhibits no discharge. Left eye exhibits no discharge. No scleral icterus.  Neck: Normal range of motion.  Cardiovascular: Normal rate and regular rhythm.  Exam reveals no gallop and no friction rub.   No murmur heard. Pulmonary/Chest: Effort normal and breath sounds normal. No respiratory distress. She  has no wheezes. She has no rales. She exhibits no tenderness.  Abdominal: Soft. Bowel sounds are normal. She exhibits no distension and no mass. There is tenderness (epigastric and LUQ tenderness). There is no rebound and no guarding. No hernia.  Musculoskeletal:  Midline lumbar tenderness with left flank and CVA tenderness  Neurological: She is alert and oriented to person, place, and time.  Skin: Skin is warm. She is diaphoretic.  Psychiatric: She has a normal mood and affect. Her behavior is normal.  Nursing note and vitals reviewed.    ED Treatments / Results  Labs (all labs ordered are listed, but only abnormal results are displayed) Labs Reviewed  LIPASE, BLOOD - Abnormal; Notable for the following:       Result Value   Lipase 75 (*)    All other components within normal limits  COMPREHENSIVE METABOLIC PANEL - Abnormal; Notable for the following:    Glucose, Bld 287 (*)    BUN 29 (*)    Creatinine, Ser 2.34 (*)    ALT 13 (*)    GFR calc non Af Amer 23 (*)    GFR calc Af Amer 26 (*)    All other components within normal limits  CBC - Abnormal; Notable for the following:    WBC 13.3 (*)    RBC 3.62 (*)    Hemoglobin 11.1 (*)    HCT 33.0 (*)    All other components within normal limits  URINALYSIS, ROUTINE W REFLEX MICROSCOPIC - Abnormal; Notable for the following:    Color, Urine STRAW (*)    Glucose, UA >=500 (*)    Hgb urine dipstick SMALL (*)    Protein, ur 30 (*)    Squamous Epithelial / LPF 0-5 (*)    All other components within  normal limits  RAPID URINE DRUG SCREEN, HOSP PERFORMED - Abnormal; Notable for the following:    Tetrahydrocannabinol POSITIVE (*)    All other components within normal limits    EKG  EKG Interpretation None       Radiology No results found.  Procedures Procedures (including critical care time)  Medications Ordered in ED Medications  sodium chloride 0.9 % bolus 1,000 mL (0 mLs Intravenous Stopped 06/14/17 1138)    metoCLOPramide (REGLAN) injection 10 mg (10 mg Intravenous Given 06/14/17 0740)  famotidine (PEPCID) IVPB 20 mg premix (0 mg Intravenous Stopped 06/14/17 0818)  morphine 4 MG/ML injection 4 mg (4 mg Intravenous Given 06/14/17 0816)  haloperidol lactate (HALDOL) injection 4 mg (4 mg Intravenous Given 06/14/17 0816)     Initial Impression / Assessment and Plan / ED Course  I have reviewed the triage vital signs and the nursing notes.  Pertinent labs & imaging results that were available during my care of the patient were reviewed by me and considered in my medical decision making (see chart for details).  51 year old female with left-sided back and abdominal pain with recurrent nausea and vomiting. She is hypertensive but otherwise vital signs are normal. Symptoms are possibly due to gastroparesis versus cannabinoid hyperemesis syndrome versus mild pancreatitis. CBC remarkable for leukocytosis of 13.3. Anemia is at baseline. CMP remarkable for hyperglycemia and slightly worse BUN and creatinine from baseline. Lipase is mildly elevated at 75. No elevated anion gap and UA is negative for ketones but does have more than 500 glucose in the urine. UA does not appear infected. UDS positive for THC. Fluids, Reglan, Pepcid started.  Nursing test since that patient still very uncomfortable and vomiting. Morphine and Haldol given.  Discussed all results with patient. She is not feeling well enough to go home. I discussed with her that there is nothing acute that we would need to admit her for it unless she cannot tolerate by mouth. PO challenge with water and juice given.  PO challenge was tolerated. Will d/c with Reglan and short course of pain meds. Return precautions given.  Final Clinical Impressions(s) / ED Diagnoses   Final diagnoses:  Acute left-sided low back pain without sciatica  Left sided abdominal pain  Type 2 diabetes mellitus with hyperglycemia, with long-term current use of insulin (HCC)   Stage 3 chronic kidney disease  Marijuana use  Gastroparesis due to DM (HCC)  Non-intractable cyclical vomiting with nausea    New Prescriptions New Prescriptions   No medications on file     Iris Pert 06/14/17 Hinckley, Kevin, MD 06/14/17 1630

## 2017-06-14 NOTE — ED Notes (Signed)
Pt. Resting at this time. BP trending down. Skin dry.Marland Kitchen

## 2017-06-21 ENCOUNTER — Other Ambulatory Visit: Payer: Self-pay | Admitting: Family Medicine

## 2017-06-21 DIAGNOSIS — K3184 Gastroparesis: Secondary | ICD-10-CM

## 2017-06-21 NOTE — Telephone Encounter (Signed)
Patient calling to request refill of:  Name of Medication(s):  oxycodone Last date of OV:  05/26/2017  Pharmacy:  Patient to pick up  Will route refill request to MD. [name]

## 2017-06-21 NOTE — Telephone Encounter (Signed)
Patient says they need a refill on their Oxycodone.  Please let her know if you can fill, thanks.

## 2017-06-22 MED ORDER — OXYCODONE HCL 5 MG PO TABS: 5.0000 mg | ORAL_TABLET | ORAL | 0 refills | Status: DC | PRN

## 2017-06-22 NOTE — Telephone Encounter (Signed)
Tried calling patient but there was no answer or VM set up.  Will try calling her again later today. [name],CMA

## 2017-06-22 NOTE — Telephone Encounter (Signed)
Tried calling patient again but there was no answer or VM. [name],CMA

## 2017-06-24 NOTE — Telephone Encounter (Signed)
Pt was advised. ep °

## 2017-06-28 NOTE — Telephone Encounter (Signed)
Pt is still sick.  She is taking ativan and phenagram for her nausea..  It isnt working.  She is so tired to vomiting. She was crying, she wants to talk to dr Wendy Poet

## 2017-06-30 ENCOUNTER — Encounter: Payer: Self-pay | Admitting: Internal Medicine

## 2017-07-21 ENCOUNTER — Encounter: Payer: Self-pay | Admitting: Family Medicine

## 2017-07-21 ENCOUNTER — Ambulatory Visit: Payer: Medicare Other | Admitting: Family Medicine

## 2017-07-21 ENCOUNTER — Ambulatory Visit (INDEPENDENT_AMBULATORY_CARE_PROVIDER_SITE_OTHER): Payer: Medicare Other | Admitting: Family Medicine

## 2017-07-21 VITALS — BP 134/60 | HR 63 | Temp 97.6°F | Ht 62.0 in | Wt 142.0 lb

## 2017-07-21 DIAGNOSIS — G43A0 Cyclical vomiting, not intractable: Secondary | ICD-10-CM

## 2017-07-21 DIAGNOSIS — K3184 Gastroparesis: Secondary | ICD-10-CM

## 2017-07-21 DIAGNOSIS — E118 Type 2 diabetes mellitus with unspecified complications: Secondary | ICD-10-CM | POA: Diagnosis not present

## 2017-07-21 DIAGNOSIS — N184 Chronic kidney disease, stage 4 (severe): Secondary | ICD-10-CM | POA: Diagnosis not present

## 2017-07-21 DIAGNOSIS — Z794 Long term (current) use of insulin: Secondary | ICD-10-CM | POA: Diagnosis not present

## 2017-07-21 DIAGNOSIS — E1143 Type 2 diabetes mellitus with diabetic autonomic (poly)neuropathy: Secondary | ICD-10-CM | POA: Diagnosis not present

## 2017-07-21 LAB — POCT GLYCOSYLATED HEMOGLOBIN (HGB A1C): HEMOGLOBIN A1C: 7.9

## 2017-07-21 MED ORDER — OXYCODONE HCL 5 MG PO TABS
5.0000 mg | ORAL_TABLET | ORAL | 0 refills | Status: DC | PRN
Start: 2017-07-21 — End: 2017-09-22

## 2017-07-21 MED ORDER — PEN NEEDLES 30G X 5 MM MISC: 12.0000 [IU] | Freq: Every day | 99 refills | Status: DC

## 2017-07-21 MED ORDER — OXYCODONE HCL 5 MG PO TABS: 5.0000 mg | ORAL_TABLET | Freq: Four times a day (QID) | ORAL | 0 refills | Status: DC | PRN

## 2017-07-21 MED ORDER — HALOPERIDOL 0.5 MG PO TABS: 0.5000 mg | ORAL_TABLET | Freq: Three times a day (TID) | ORAL | 0 refills | Status: DC | PRN

## 2017-07-21 MED ORDER — LORAZEPAM 1 MG PO TABS: 1.0000 mg | ORAL_TABLET | Freq: Four times a day (QID) | ORAL | 2 refills | Status: DC | PRN

## 2017-07-21 NOTE — Patient Instructions (Signed)
Lets try a new regiment of medicines for your flare ups of vomiting 1) Increase the Ativan (lorazepam) to 1 mg doses, you can repeat this every 6hours as needed for nausea and vomiting.  2) Start Haldol 0.5 mg tablet, take every 8 hours as needed for vomiting.    Your A1c is much better.  It is 7.9% today.  Lets see if it will get back to less than 7.0% within the next 3 months.   Dr Alieu Finnigan will call you if your tests are not good. Otherwise he will send you a letter.  If you sign up for MyChart online, you will be able to see your test results once Dr Roylene Heaton has reviewed them.  If you do not hear from [place] with in 2 weeks please call our office

## 2017-07-22 ENCOUNTER — Encounter: Payer: Self-pay | Admitting: Family Medicine

## 2017-07-22 ENCOUNTER — Telehealth: Payer: Self-pay | Admitting: Family Medicine

## 2017-07-22 LAB — CBC
HEMATOCRIT: 31.5 % — AB (ref 34.0–46.6)
HEMOGLOBIN: 10.5 g/dL — AB (ref 11.1–15.9)
MCH: 31 pg (ref 26.6–33.0)
MCHC: 33.3 g/dL (ref 31.5–35.7)
MCV: 93 fL (ref 79–97)
Platelets: 240 10*3/uL (ref 150–379)
RBC: 3.39 x10E6/uL — AB (ref 3.77–5.28)
RDW: 15.7 % — ABNORMAL HIGH (ref 12.3–15.4)
WBC: 5.3 10*3/uL (ref 3.4–10.8)

## 2017-07-22 LAB — RENAL FUNCTION PANEL
ALBUMIN: 4.4 g/dL (ref 3.5–5.5)
BUN/Creatinine Ratio: 16 (ref 9–23)
BUN: 39 mg/dL — AB (ref 6–24)
CO2: 22 mmol/L (ref 20–29)
Calcium: 10 mg/dL (ref 8.7–10.2)
Chloride: 103 mmol/L (ref 96–106)
Creatinine, Ser: 2.37 mg/dL — ABNORMAL HIGH (ref 0.57–1.00)
GFR calc Af Amer: 26 mL/min/{1.73_m2} — ABNORMAL LOW (ref >59)
GFR, EST NON AFRICAN AMERICAN: 23 mL/min/{1.73_m2} — AB (ref >59)
GLUCOSE: 111 mg/dL — AB (ref 65–99)
PHOSPHORUS: 4.6 mg/dL — AB (ref 2.5–4.5)
POTASSIUM: 5.7 mmol/L — AB (ref 3.5–5.2)
Sodium: 135 mmol/L (ref 134–144)

## 2017-07-22 NOTE — Assessment & Plan Note (Signed)
Established problem BMP Latest Ref Rng & Units 07/21/2017 06/14/2017 04/21/2017  Glucose 65 - 99 mg/dL 111(H) 287(H) 212(H)  BUN 6 - 24 mg/dL 39(H) 29(H) 27(H)  Creatinine 0.57 - 1.00 mg/dL 2.37(H) 2.34(H) 2.21(H)  BUN/Creat Ratio 9 - 23 16 - 12  Sodium 134 - 144 mmol/L 135 139 140  Potassium 3.5 - 5.2 mmol/L 5.7(H) 4.2 5.2  Chloride 96 - 106 mmol/L 103 103 101  CO2 20 - 29 mmol/L '22 26 18  ' Calcium 8.7 - 10.2 mg/dL 10.0 9.9 9.8   Stable eGFR. Increased K+ slightly.

## 2017-07-22 NOTE — Assessment & Plan Note (Signed)
Established problem that has improved.  Lab Results  Component Value Date   HGBA1C 7.9 07/21/2017  Improved from > 9% in March.  Plan to continue Lantus 12 units [place] bedtime.  Will add Novolog with meals next visit if goal less than 7.5 not achieved.

## 2017-07-22 NOTE — Progress Notes (Signed)
   Subjective:    Patient ID: [name] [name], female    DOB: 10-Oct-1966, 51 y.o.   MRN: [id number] [name] [name] is alone Sources of clinical information for visit is/are patient and past medical records. I reviewed ED visit note from 06/14/17 Nursing assessment for this office visit was reviewed with the patient for accuracy and revision.   HPI Cyclic Vomiting / diabetic Gastroparesis - longstanding problem - If nausea flares not respond Ativan 0. 5mg  then tries phenergan.  Neither have been helping last last few weeks per patient.  Pt treated with Ativan 1 mg doses when admitted to hospital in past. - Change in pattern of flares with thoracolumbar back pain that then moves to front then nausea/vomiting start - Pt recalls that Haldol helped during a hospitalization last year when phenergan, Zofran, Reglan failed.  - Patient had ED visit for flare 06/14/17. Treated with IVF liter bolus, Reglan 10 mg IV, Pepcid, Morphine 4 mg IV and Haloperidol 4 mg IV. Pt discharged home. - Pt believes Ativan usually was most effective in terminating a flare in past but not in the last few weeks.   CHRONIC DIABETES, Type 1  Disease Monitoring  Blood Sugar Ranges: fasting highest 280, lowest 86.  Usually around 180 fasting.  Polyuria: no   Visual problems: no   Medication Compliance: yes, Lantus 12 units daily  Medication Side Effects  Hypoglycemia: no  CHRONIC KIDNEY DISEASE STAGE IV  - Onset: several years ago - Use of NSAIDS: no - Taking ACEI/ARB: no     Taking Diuretic: no - Edema: no        Fatigue: no   Memory/Confusion: no - Taking Vitamin D or analog, CalciumBone Metabolism: no -  Activity level: active - Anemia Symptoms: no - Voiding difficulties: no  SH: marijuana(+).  (+) tobacco. Unemployed  Review of Systems See HPI    Objective:   Physical Exam VS reviewed GEN: Alert, Cooperative, Groomed, NAD HEENT: PERRL; EAC bilaterally not occluded,  COR: RRR, No M/G/R, No JVD, Normal  PMI size and location LUNGS: BCTA, No Acc mm use, speaking in full sentences ABDOMEN: (+)BS, soft, NT, ND, No HSM, No palpable masses EXT: No peripheral leg edema.  Gait: Normal speed, No significant path deviation, Step through +,  Psych: Normal affect/thought/speech/language    Assessment & Plan:  See problem list

## 2017-07-22 NOTE — Assessment & Plan Note (Addendum)
Established problem worsened by report, though weight is up three pounds since last visit.  Trial of increased Lorazepam to 1 mg PO doses and trial of starting Haldol 0.5 mg PO prn N/V not responsive to Lorazepam.  Other interventions available are Oxycodone 5 mg PO, Phenergan PO/PR.   No hits on [place] [facility] because patient has her controlled substances fillwed in [place] where she lives. [facility])

## 2017-07-30 ENCOUNTER — Encounter: Payer: Self-pay | Admitting: Family Medicine

## 2017-08-26 ENCOUNTER — Telehealth: Payer: Self-pay | Admitting: *Deleted

## 2017-08-26 ENCOUNTER — Ambulatory Visit (AMBULATORY_SURGERY_CENTER): Payer: Self-pay | Admitting: *Deleted

## 2017-08-26 VITALS — Ht 61.5 in | Wt 139.0 lb

## 2017-08-26 DIAGNOSIS — Z1211 Encounter for screening for malignant neoplasm of colon: Secondary | ICD-10-CM

## 2017-08-26 MED ORDER — NA SULFATE-K SULFATE-MG SULF 17.5-3.13-1.6 GM/177ML PO SOLN: 1.0000 [IU] | Freq: Once | ORAL | 0 refills | Status: AC

## 2017-08-26 NOTE — Progress Notes (Signed)
No egg or soy allergy known to patient  Pt has issues with past sedation no intubation problems  Pt. Is hard to wake up after sedation No diet pills per patient No home 02 use per patient  No blood thinners per patient  Pt denies issues with constipation on Amitiza No A fib or A flutter  EMMI video sent to pt's e mail pt. decline

## 2017-08-26 NOTE — Telephone Encounter (Signed)
She does have a lot of medical problems. Who is sending her over? why? In any event, I agree, please have her see one of the physician assistant's to be evaluated to determine the reason she is being referred for a procedure and the appropriateness. They can determine the most appropriate location. Thanks Pamala Hurry

## 2017-08-26 NOTE — Telephone Encounter (Signed)
Pt. Is scheduled to be done at [facility] and when doing her pre visit, I noticed she has an extensive hx.  Hx of MI, CKD stage 3, and DM.  She has a port due to being a hard IV stick.  Dr. Henrene Pastor, will you please take a look at her chart and advise if she needs to be done at [facility]?  Thank you, B. Benitez P.V.

## 2017-08-30 NOTE — Telephone Encounter (Signed)
Attempted to call patient and left generic message for her to call the office.

## 2017-08-31 NOTE — Telephone Encounter (Signed)
Talked with pt; scheduled for OV with Amy Esterwood 09/05/17 at 2:00 pm.  Colonoscopy scheduled for 9/14 was not cancelled.  Will need to cancel after OV if decided she needs procedure at [facility].

## 2017-09-01 ENCOUNTER — Ambulatory Visit (INDEPENDENT_AMBULATORY_CARE_PROVIDER_SITE_OTHER): Payer: Medicare Other | Admitting: Family Medicine

## 2017-09-01 ENCOUNTER — Encounter: Payer: Self-pay | Admitting: Family Medicine

## 2017-09-01 ENCOUNTER — Telehealth: Payer: Self-pay | Admitting: Family Medicine

## 2017-09-01 ENCOUNTER — Telehealth: Payer: Self-pay | Admitting: *Deleted

## 2017-09-01 VITALS — BP 136/72 | HR 62 | Temp 99.1°F | Wt 140.0 lb

## 2017-09-01 DIAGNOSIS — H00011 Hordeolum externum right upper eyelid: Secondary | ICD-10-CM | POA: Diagnosis not present

## 2017-09-01 DIAGNOSIS — Z0000 Encounter for general adult medical examination without abnormal findings: Secondary | ICD-10-CM

## 2017-09-01 DIAGNOSIS — Z5181 Encounter for therapeutic drug level monitoring: Secondary | ICD-10-CM | POA: Diagnosis not present

## 2017-09-01 DIAGNOSIS — Z1239 Encounter for other screening for malignant neoplasm of breast: Secondary | ICD-10-CM

## 2017-09-01 DIAGNOSIS — M545 Low back pain, unspecified: Secondary | ICD-10-CM

## 2017-09-01 DIAGNOSIS — E118 Type 2 diabetes mellitus with unspecified complications: Secondary | ICD-10-CM | POA: Diagnosis not present

## 2017-09-01 DIAGNOSIS — Z1231 Encounter for screening mammogram for malignant neoplasm of breast: Secondary | ICD-10-CM

## 2017-09-01 DIAGNOSIS — Z79891 Long term (current) use of opiate analgesic: Secondary | ICD-10-CM

## 2017-09-01 DIAGNOSIS — Z794 Long term (current) use of insulin: Secondary | ICD-10-CM

## 2017-09-01 DIAGNOSIS — G43A0 Cyclical vomiting, not intractable: Secondary | ICD-10-CM | POA: Diagnosis not present

## 2017-09-01 DIAGNOSIS — E875 Hyperkalemia: Secondary | ICD-10-CM

## 2017-09-01 DIAGNOSIS — F17200 Nicotine dependence, unspecified, uncomplicated: Secondary | ICD-10-CM

## 2017-09-01 DIAGNOSIS — Z23 Encounter for immunization: Secondary | ICD-10-CM

## 2017-09-01 DIAGNOSIS — N184 Chronic kidney disease, stage 4 (severe): Secondary | ICD-10-CM | POA: Diagnosis not present

## 2017-09-01 DIAGNOSIS — I10 Essential (primary) hypertension: Secondary | ICD-10-CM | POA: Diagnosis not present

## 2017-09-01 MED ORDER — SULFACETAMIDE SODIUM 10 % OP SOLN: 1.0000 [drp] | OPHTHALMIC | Status: DC

## 2017-09-01 NOTE — Telephone Encounter (Signed)
-----   Message from [place], MD sent at 09/01/2017  9:51 AM EDT ----- Regarding: AWV Please contact Ms Yahnke about AWV.  She will all ready be in [place] on 9/10 for a doctors appointment, if you could accommodate her. Thanks  [employer]

## 2017-09-01 NOTE — Telephone Encounter (Signed)
Attempted to reach patient to schedule AWV for 09/05/2017. Left message on VM requesting return call. Hubbard Hartshorn, RN, BSN

## 2017-09-01 NOTE — Telephone Encounter (Signed)
Please call in the Rx for eyedrops to [facility] on [address].

## 2017-09-01 NOTE — Patient Instructions (Signed)
We are getting you appointments to see your eye doctor and to have your mammogram perofrmed.n   Ms [name] Rusk, RN, will call you about scheduling a Medicare Annual Wellness Visit.  Will see if we can get it to coincide with your appointment day with Dr Henrene Pastor.   Marland [employer]abs

## 2017-09-02 ENCOUNTER — Encounter: Payer: Self-pay | Admitting: Family Medicine

## 2017-09-02 DIAGNOSIS — H00019 Hordeolum externum unspecified eye, unspecified eyelid: Secondary | ICD-10-CM

## 2017-09-02 DIAGNOSIS — Z0000 Encounter for general adult medical examination without abnormal findings: Secondary | ICD-10-CM | POA: Insufficient documentation

## 2017-09-02 HISTORY — DX: Hordeolum externum unspecified eye, unspecified eyelid: H00.019

## 2017-09-02 LAB — BASIC METABOLIC PANEL
BUN / CREAT RATIO: 12 (ref 9–23)
BUN: 25 mg/dL — AB (ref 6–24)
CHLORIDE: 105 mmol/L (ref 96–106)
CO2: 21 mmol/L (ref 20–29)
Calcium: 10.2 mg/dL (ref 8.7–10.2)
Creatinine, Ser: 2.01 mg/dL — ABNORMAL HIGH (ref 0.57–1.00)
GFR calc Af Amer: 32 mL/min/{1.73_m2} — ABNORMAL LOW (ref >59)
GFR calc non Af Amer: 28 mL/min/{1.73_m2} — ABNORMAL LOW (ref >59)
GLUCOSE: 129 mg/dL — AB (ref 65–99)
POTASSIUM: 5.1 mmol/L (ref 3.5–5.2)
Sodium: 141 mmol/L (ref 134–144)

## 2017-09-02 MED ORDER — SULFACETAMIDE SODIUM 10 % OP SOLN: 1.0000 [drp] | OPHTHALMIC | 0 refills | Status: AC

## 2017-09-02 NOTE — Assessment & Plan Note (Addendum)
BMP Latest Ref Rng & Units 09/01/2017 07/21/2017 06/14/2017  Glucose 65 - 99 mg/dL 129(H) 111(H) 287(H)  BUN 6 - 24 mg/dL 25(H) 39(H) 29(H)  Creatinine 0.57 - 1.00 mg/dL 2.01(H) 2.37(H) 2.34(H)  BUN/Creat Ratio 9 - 23 12 16  -  Sodium 134 - 144 mmol/L 141 135 139  Potassium 3.5 - 5.2 mmol/L 5.1 5.7(H) 4.2  Chloride 96 - 106 mmol/L 105 103 103  CO2 20 - 29 mmol/L 21 22 26   Calcium 8.7 - 10.2 mg/dL 10.2 10.0 9.9   Improved.  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

## 2017-09-02 NOTE — Assessment & Plan Note (Signed)
Established problem that has improved per Patient's report of home CBG. Will see if Medicaid will cover cost of Glucerna for patient to use to avoid hypoglycemia-related to her cyclic vomiting and longacting insulin therapy.  Asked Ms [name] [name], about coverage.

## 2017-09-02 NOTE — Assessment & Plan Note (Signed)
Adequate blood pressure control.  No evidence of new end organ damage.  Tolerating medication without significant adverse effects.  Plan to continue current blood pressure regiment.   

## 2017-09-02 NOTE — Assessment & Plan Note (Signed)
New problem, Acute. Topical warm compressess 5 days of sulfaceamide ophth drops.  Red flags for RTC given.

## 2017-09-02 NOTE — Telephone Encounter (Signed)
Incorrectly created order for clinic administration of sulfacetamide. Prescription for sulfacetamide ophth drops sent to patient's pharmacy.

## 2017-09-02 NOTE — Assessment & Plan Note (Signed)
Patient scheduled for CRC screening colonoscopy next week with Dr Henrene Pastor Referred patient back to Dr Gershon Crane [facility]) for diabetic eye exam. Referred patient to [facility] for screening mammography.  Patient agreed to attending Annual Wellness Visit with Ms Silvano Rusk, RN.  Message sent to Ms Silvano Rusk to try and arrange visit.

## 2017-09-02 NOTE — Assessment & Plan Note (Signed)
BMP Latest Ref Rng & Units 09/01/2017 07/21/2017 06/14/2017  Glucose 65 - 99 mg/dL 129(H) 111(H) 287(H)  BUN 6 - 24 mg/dL 25(H) 39(H) 29(H)  Creatinine 0.57 - 1.00 mg/dL 2.01(H) 2.37(H) 2.34(H)  BUN/Creat Ratio 9 - 23 12 16  -  Sodium 134 - 144 mmol/L 141 135 139  Potassium 3.5 - 5.2 mmol/L 5.1 5.7(H) 4.2  Chloride 96 - 106 mmol/L 105 103 103  CO2 20 - 29 mmol/L 21 22 26   Calcium 8.7 - 10.2 mg/dL 10.2 10.0 9.9   Stable. No change in therapy.

## 2017-09-02 NOTE — Assessment & Plan Note (Addendum)
Established problem. Stable. No ED visits since last ov.   No hospital admissions in over a year.  Continue current therapy with abortive Lorazepam 1 mg oral prodrome nausea,  Abortive oxycodone 5 mg oral prodrome of midline thoracolumbar pain.  Haldol oral for rescue.  Phenergan is third line should other interventions fail.  Review of [place] CSDB today showed no evidence of doctor shopping.  RTC 6 weeks for monitoring

## 2017-09-02 NOTE — Progress Notes (Signed)
   Subjective:    Patient ID: [name] [name], female    DOB: 27-Apr-1966, 51 y.o.   MRN: [id number] [name] [name] is alone Sources of clinical information for visit is/are patient and past medical records. Nursing assessment for this office visit was reviewed with the patient for accuracy and revision.  Depression screen PHQ 2/9 09/01/2017  Decreased Interest 0  Down, Depressed, Hopeless 0  PHQ - 2 Score 0  Altered sleeping -  Tired, decreased energy -  Change in appetite -  Feeling bad or failure about yourself  -  Trouble concentrating -  Moving slowly or fidgety/restless -  Suicidal thoughts -  PHQ-9 Score -  Difficult doing work/chores -  Some recent data might be hidden   Fall Risk  08/06/2016 10/15/2015 11/06/2014 09/27/2013  Falls in the past year? No No No Yes  Number falls in past yr: - - - 1  Risk for fall due to : (No Data) - - -  Risk for fall due to: Comment No Fall Rsisks identified today - - -    HPI  Hyperkalemia - present on last BMET.  Known CKD - No potassium supplements nor use of salt substitues - no palpiations, no dizziness, no syncope  Cyclic Vomiting - longstanding issue - Trial of Ativan 1 mg prn has improved her ability to terminate an vomiting attack.   - She has had to use Haldol only once since last ov.  She still uses phenergan occasionally.  - No ED visits since last ov - Conflict with her son and his wedding and other siblings has been stressful for pt.  She feels her religious belief helps her cope well with the stress.  - Has used oxycodone when back pain prodrome to exacerbations occur with good success.   - Review of [place] CSRS today found no evidence of docotr shopping.   DMT2 - Home CBGs in mid to high 100s - Two episodes of CBGs in 60s with associated tremulousness and unpleasant feeling that she was able to self rescues.  She identifies both episodes related to not eating when she felt nauseous.  - taking Lantus 12 units each morning.  She hjad been intolerant of metformin in past.  - no chest pain, no claudication, no limb weakness nor numbness   Right eye problem History elements: Onset / Duration: several days ago Quality: burning and itching Location: right upper eye lid Severity: mild Timing: constant Course: acute Treatment prior to visit: none Modifying Factors: rubbing a lid Associated symptoms: No change in vision, no tearing, no eye drainage.  No eye pain.      SH: continues to smoke.  In contemplative phase of change.   Review of Systems No headache No fever    Objective:   Physical Exam VS reviewed GEN: Alert, Cooperative, Groomed, NAD HEENT: PERRL; right upper eyelid slightly edematous with mild latera erythema, TTP.  No eyelid edge discharge, no preauricula LN, No cervical LAN,   COR: RRR, No M/G/R, No JVD, Normal PMI size and location LUNGS: BCTA, No Acc mm use, speaking in full sentences Gait: Normal speed, No significant path deviation, Step through +,  Psych: Normal affect/thought/speech/language    Assessment & Plan:  See problem list

## 2017-09-02 NOTE — Assessment & Plan Note (Signed)
Intermittent prodrome preceds cyclic vomiting attacks. Judisious use of abortive oxycodone effectively terminating attacks.  No evidence of abuse.  No doctor shopping on review of [place] CSRS.

## 2017-09-02 NOTE — Assessment & Plan Note (Signed)
Contemplative phase of change.  Motivaltion interviewing provided.  No further assistance with smoking cessation requested. Continue to offer assistance.

## 2017-09-05 ENCOUNTER — Encounter: Payer: Self-pay | Admitting: Physician Assistant

## 2017-09-05 ENCOUNTER — Encounter: Payer: Self-pay | Admitting: Family Medicine

## 2017-09-05 ENCOUNTER — Ambulatory Visit (INDEPENDENT_AMBULATORY_CARE_PROVIDER_SITE_OTHER): Payer: Medicare Other | Admitting: Physician Assistant

## 2017-09-05 VITALS — BP 128/76 | HR 72 | Ht 61.5 in | Wt 140.0 lb

## 2017-09-05 DIAGNOSIS — Z1211 Encounter for screening for malignant neoplasm of colon: Secondary | ICD-10-CM

## 2017-09-05 DIAGNOSIS — K219 Gastro-esophageal reflux disease without esophagitis: Secondary | ICD-10-CM

## 2017-09-05 DIAGNOSIS — N184 Chronic kidney disease, stage 4 (severe): Secondary | ICD-10-CM | POA: Diagnosis not present

## 2017-09-05 DIAGNOSIS — K3184 Gastroparesis: Secondary | ICD-10-CM | POA: Diagnosis not present

## 2017-09-05 NOTE — Telephone Encounter (Signed)
Unable to reach patient to schedule AWV for today. Left message on VM to return call to schedule. Hubbard Hartshorn, RN, BSN

## 2017-09-05 NOTE — Progress Notes (Signed)
Subjective:    Patient ID: [name] [name], female    DOB: 1966-07-01, 51 y.o.   MRN: [id number]  HPI [name] [name] is a pleasant 51 year old African-American female, who had been scheduled for direct colonoscopy, referred per Dr. Vikki Ports. At the time of previsit there was concern regarding her multiple comorbidities and she was brought into the office. Patient has no prior history of colonoscopy. She has had  prior EGDs,, last in 2012 for nausea and vomiting. She was found to have distal esophagitis. Patient has long-standing history of adult-onset diabetes mellitus diagnosed over 20 years ago. She is insulin-dependent, has history of diabetic gastroparesis, hypertension, chronic kidney disease stage IV, chronic constipation, she is status post hysterectomy and cholecystectomy and has had lysis of adhesions. She is labeled as having a cyclic vomiting syndrome area and she says she never had any problems with vomiting until after she had her hysterectomy. She is currently maintained on pantoprazole 20 mg once daily, Phenergan when necessary, Amitiza twice daily and Reglan 10 mg every 6 hours. She has no current complaints of abdominal pain, she says the Amitiza helps her but doesn't work as well as he used to and sometimes she'll still go 3 or 4 days without a bowel movement. She's not noticed any melena or hematochezia. She does have a neck sternal hemorrhoid that bothers her intermittently. History negative for colon cancer and polyps as far she is aware.  Review of Systems Pertinent positive and negative review of systems were noted in the above HPI section.  All other review of systems was otherwise negative.  Outpatient Encounter Prescriptions as of 09/05/2017  Medication Sig  . aspirin 81 MG tablet Take 1 tablet (81 mg total) by mouth daily.  . Blood Glucose Monitoring Suppl (ONE TOUCH ULTRA SYSTEM KIT) w/Device KIT 1 kit by Does not apply route 2 (two) times daily before a meal.  . carvedilol  (COREG) 6.25 MG tablet Take 1 tablet (6.25 mg total) by mouth 2 (two) times daily with a meal.  . haloperidol (HALDOL) 0.5 MG tablet Take 1 tablet (0.5 mg total) by mouth every 8 (eight) hours as needed for agitation.  . Insulin Glargine (LANTUS SOLOSTAR) 100 UNIT/ML Solostar Pen Inject 12 Units into the skin daily at 10 pm.  . Insulin Pen Needle (PEN NEEDLES) 30G X 5 MM MISC 12 Units by Does not apply route daily.  Marland Kitchen LORazepam (ATIVAN) 1 MG tablet Take 1 tablet (1 mg total) by mouth every 6 (six) hours as needed for anxiety.  Marland Kitchen lubiprostone (AMITIZA) 24 MCG capsule Take 1 capsule (24 mcg total) by mouth 2 (two) times daily with a meal.  . metoCLOPramide (REGLAN) 10 MG tablet Take 1 tablet (10 mg total) by mouth every 6 (six) hours.  Marland Kitchen oxyCODONE (OXY IR/ROXICODONE) 5 MG immediate release tablet Take 1 tablet (5 mg total) by mouth every 4 (four) hours as needed for severe pain.  . pantoprazole (PROTONIX) 20 MG tablet Take 1 tablet (20 mg total) by mouth daily.  . promethazine (PHENERGAN) 12.5 MG tablet Take 1 tablet (12.5 mg total) by mouth every 6 (six) hours as needed for nausea or vomiting.  . promethazine (PHENERGAN) 25 MG suppository Place 1 suppository (25 mg total) rectally every 6 (six) hours as needed for nausea or vomiting.  . sulfacetamide (BLEPH-10) 10 % ophthalmic solution Place 1 drop into the right eye every 3 (three) hours.   No facility-administered encounter medications on file as of 09/05/2017.    Allergies  Allergen Reactions  . Bee Venom Anaphylaxis    Required hospital visit to ER  . Tegaderm Ag Mesh [Silver] Dermatitis    And [place] tape- causes blisters and itching  . Acetaminophen Nausea And Vomiting  . Novolog [Insulin Aspart] Other (See Comments)    Muscles in feet cramp  . Statins Other (See Comments)    GI intolerance (N/V)  . Ceftriaxone Other (See Comments)    Caused an ulcer in her mouth  . Erythromycin Nausea And Vomiting   Patient Active Problem List     Diagnosis Date Noted  . Healthcare maintenance 09/02/2017  . Stye external 09/02/2017  . Pre-ulcerative calluses 05/27/2017  . Screening for colon cancer 05/27/2017  . Primary income source is welfare benefits 03/10/2017  . Acquired bilateral hammer toes 01/20/2017  . Port-a-cath in place 01/20/2017  . Secondary hyperparathyroidism of renal origin ([place]) 10/05/2016  . Recurrent major depressive disorder ([name])   . Essential hypertension 05/01/2016  . Diabetic gastroparesis associated with type 2 diabetes mellitus ([name]) 05/01/2016  . DM (diabetes mellitus), type 2 with complications ([name])   . Hyperkalemia 11/28/2015  . Normocytic anemia   . Intermittent constipation 06/06/2015  . Encounter for monitoring opioid maintenance therapy 01/06/2015  . Vitamin D deficiency 11/28/2014  . Pure hypercholesterolemia 08/15/2014  . Chronic kidney disease (CKD) stage G4/A2, severely decreased glomerular filtration rate (GFR) between 15-29 mL/min/1.73 square meter and albuminuria creatinine ratio between 30-299 mg/g ([facility]) 08/17/2013  . Esophagitis determined by endoscopy 04/18/2013  . Allergic rhinitis, seasonal 05/04/2012  . Marijuana smoker ([name]) 11/03/2011  . Cyclic vomiting syndrome 08/03/2011  . Nonspecific low back pain 04/10/2007  . Anxiety disorder 02/23/2007  . TOBACCO DEPENDENCE 02/23/2007  . DM neuropathies ([name]) 02/23/2007  . GERD with esophagitis 01/27/2004   Social History   Social History  . Marital status: Single    Spouse name: N/A  . Number of children: 5  . Years of education: 12   Occupational History  . Not on file.   Social History Main Topics  . Smoking status: Current Some Day Smoker    Packs/day: 0.50    Years: 22.00    Types: Cigarettes    Start date: 12/27/1982  . Smokeless tobacco: Never Used     Comment: THC 1-2 times per day almost daily  . Alcohol use 0.0 oz/week     Comment: rarely a beer every 6 months  . Drug use: Yes    Types: Marijuana      Comment: Current Daily to weekly use  . Sexual activity: Yes    Birth control/ protection: None   Other Topics Concern  . Not on file   Social History Narrative   Lives in [place] - some minimal family support   Has 5 children   Has 10 grandchildren   Cares for her Dog [name] (Mixed female [place] and [place])     Ms. Dulski family history includes Diabetes in her mother and sister; Diabetes type II in her mother and sister; Hypertension in her brother, mother, and sister.      Objective:    Vitals:   09/05/17 1355  BP: 128/76  Pulse: 72    Physical Exam well-developed African-American female in no acute distress, pleasant, blood pressure 128/76 pulse 72, height 5 foot 1, weight 140, BMI of 26.0. HEENT; nontraumatic, cephalic EOMI PERRLA sclera anicteric, Cardiovascular; regular rate and rhythm with S1-S2 no murmur rub or gallop, Pulmonary ;clear bilaterally, Abdomen ;soft nontender nondistended bowel sounds are  active there is no palpable mass or hepatosplenomegaly, Rectal ;exam not done, Extremities; no clubbing cyanosis or edema skin warm and dry, Neuropsych; mood and affect appropriate       Assessment & Plan:   #51 51 year old African-American female, diabetic with history of diabetic gastroparesis referred for screening Colonoscopy #2 hypertension #3 chronic kidney disease stage IV #4 hyperparathyroidism #5 chronic GERD #6 history of nausea and vomiting likely secondary to underlying gastroparesis #7 status post hysterectomy and cholecystectomy  Plan; Will proceed with colonoscopy with Dr. Henrene Pastor on 09/09/2017 in the [place]. Procedure discussed in detail with the patient including indications risks and benefits, questions answered and she is agreeable to proceed.  Amy S Esterwood PA-C 09/05/2017   Cc: McDiarmid, Blane Ohara, MD

## 2017-09-05 NOTE — Patient Instructions (Signed)
Try the Miralax 17 gram in 6-8 oz prune juice daily to help with constipation.  We have provided you with another copy of the colonoscopy instructions.    If you are age 52 or younger, your body mass index should be between 19-25. Your Body mass index is 26.02 kg/m. If this is out of the aformentioned range listed, please consider follow up with your Primary Care Provider.

## 2017-09-05 NOTE — Progress Notes (Signed)
Assessment and plans reviewed  

## 2017-09-05 NOTE — Telephone Encounter (Signed)
Patient returned call. AWV scheduled for 09/28/2017 at patient request. Hubbard Hartshorn, RN, BSN

## 2017-09-07 ENCOUNTER — Telehealth: Payer: Self-pay | Admitting: Internal Medicine

## 2017-09-09 ENCOUNTER — Encounter: Payer: Medicare Other | Admitting: Internal Medicine

## 2017-09-22 ENCOUNTER — Other Ambulatory Visit: Payer: Self-pay | Admitting: Family Medicine

## 2017-09-22 DIAGNOSIS — K3184 Gastroparesis: Secondary | ICD-10-CM

## 2017-09-22 MED ORDER — OXYCODONE HCL 5 MG PO TABS: 5.0000 mg | ORAL_TABLET | ORAL | 0 refills | Status: DC | PRN

## 2017-09-22 NOTE — Telephone Encounter (Signed)
Patient is aware. [name],CMA  

## 2017-09-22 NOTE — Telephone Encounter (Signed)
Pt is calling for a refill on her Oxycodone.jw

## 2017-09-22 NOTE — Telephone Encounter (Signed)
Please let patient know her prescription(s) are available for pick up from the [facility] front desk.

## 2017-09-28 ENCOUNTER — Encounter: Payer: Self-pay | Admitting: *Deleted

## 2017-09-28 ENCOUNTER — Encounter: Payer: Self-pay | Admitting: Licensed Clinical Social Worker

## 2017-09-28 ENCOUNTER — Ambulatory Visit (INDEPENDENT_AMBULATORY_CARE_PROVIDER_SITE_OTHER): Payer: Medicare Other | Admitting: *Deleted

## 2017-09-28 VITALS — BP 148/66 | HR 56 | Temp 98.2°F | Ht 61.5 in | Wt 136.0 lb

## 2017-09-28 DIAGNOSIS — Z114 Encounter for screening for human immunodeficiency virus [HIV]: Secondary | ICD-10-CM

## 2017-09-28 DIAGNOSIS — R32 Unspecified urinary incontinence: Secondary | ICD-10-CM

## 2017-09-28 DIAGNOSIS — Z0000 Encounter for general adult medical examination without abnormal findings: Secondary | ICD-10-CM | POA: Diagnosis not present

## 2017-09-28 NOTE — Progress Notes (Signed)
Subjective:   [name] [name] is a 51 y.o. female who presents for an Initial Medicare Annual Wellness Visit.   Cardiac Risk Factors include: diabetes mellitus;dyslipidemia;hypertension;sedentary lifestyle;smoking/ tobacco exposure     Objective:    Today's Vitals   09/28/17 1009 09/28/17 1012  BP:  (!) 148/66  Pulse:  (!) 56  Temp:  98.2 F (36.8 C)  TempSrc:  Oral  SpO2:  99%  Weight: 136 lb (61.7 kg)   Height: 5' 1.5" (1.562 m)   PainSc: 6  6    Body mass index is 25.28 kg/m.  Patient had not taken AM dose of carvedilol as she hadn't eaten yet. Instructed to take at least 2 hours prior to [place] with PCP on 10/27/2017  Current Medications (verified) Outpatient Encounter Prescriptions as of 09/28/2017  Medication Sig  . Blood Glucose Monitoring Suppl (ONE TOUCH ULTRA SYSTEM KIT) w/Device KIT 1 kit by Does not apply route 2 (two) times daily before a meal.  . carvedilol (COREG) 6.25 MG tablet Take 1 tablet (6.25 mg total) by mouth 2 (two) times daily with a meal.  . haloperidol (HALDOL) 0.5 MG tablet Take 1 tablet (0.5 mg total) by mouth every 8 (eight) hours as needed for agitation.  . Insulin Glargine (LANTUS SOLOSTAR) 100 UNIT/ML Solostar Pen Inject 12 Units into the skin daily at 10 pm.  . Insulin Pen Needle (PEN NEEDLES) 30G X 5 MM MISC 12 Units by Does not apply route daily.  Marland Kitchen LORazepam (ATIVAN) 1 MG tablet Take 1 tablet (1 mg total) by mouth every 6 (six) hours as needed for anxiety.  Marland Kitchen lubiprostone (AMITIZA) 24 MCG capsule Take 1 capsule (24 mcg total) by mouth 2 (two) times daily with a meal.  . oxyCODONE (OXY IR/ROXICODONE) 5 MG immediate release tablet Take 1 tablet (5 mg total) by mouth every 4 (four) hours as needed for severe pain.  . promethazine (PHENERGAN) 12.5 MG tablet Take 1 tablet (12.5 mg total) by mouth every 6 (six) hours as needed for nausea or vomiting.  . promethazine (PHENERGAN) 25 MG suppository Place 1 suppository (25 mg total) rectally every 6  (six) hours as needed for nausea or vomiting.  Marland Kitchen aspirin 81 MG tablet Take 1 tablet (81 mg total) by mouth daily. (Patient not taking: Reported on 09/28/2017)  . [DISCONTINUED] metoCLOPramide (REGLAN) 10 MG tablet Take 1 tablet (10 mg total) by mouth every 6 (six) hours. (Patient not taking: Reported on 09/28/2017)  . [DISCONTINUED] pantoprazole (PROTONIX) 20 MG tablet Take 1 tablet (20 mg total) by mouth daily. (Patient not taking: Reported on 09/28/2017)   No facility-administered encounter medications on file as of 09/28/2017.     Allergies (verified) Bee venom; Tegaderm ag mesh [silver]; Acetaminophen; Novolog [insulin aspart]; Statins; Tape; Ceftriaxone; and Erythromycin   History: Past Medical History:  Diagnosis Date  . Abdominal pain, generalized   . Acquired bilateral hammer toes 01/20/2017  . Anemia   . Anxiety   . Arthritis   . At risk for polypharmacy 08/15/2014  . Biceps tendinopathy of right upper extremity 04/05/2014  . Blood transfusion 2003  . Cataract    both eyes  . Chronic generalized abdominal pain   . Chronic generalized abdominal pain   . Chronic kidney disease (CKD), stage III (moderate) (HCC)   . Chronic leg cramping 07/18/2014  . CKD (chronic kidney disease) 08/17/2013   Seen by Dr Joelyn Oms 07/28/15 ([place]) - (see scanned document): 20 years diabetes, 15 years HTN, stable  CKD stage III with proteinuria.  - CKD, discussed NSAID avoidance, diabetes control with target A1c<7.5%, and HTN control with SBP<140. F/u 6 months and repeat renal panel at that time. - Microalbuminuria: On lisinopril 54m, suspected secondary due to diabetic nephropathy. - Chronic back pain: No NSDAIDs. - Say amlodipine causes nausea  - Advised not to ACE-i during vomiting episodes    . Complication of anesthesia    "problems waking up"  . Cyclic vomiting syndrome   . Delayed gastric emptying 05/12/2011    04/12/11 NUCLEAR MEDICINE GASTRIC EMPTYING SCAN Radiopharmaceutical: 2.0 mCi  Tc-92mulfur colloid. Comparison: None. Findings: At 1 hour, 81% of the counts remain in the stomach. At 2 hours, 53% remain. Normal is less than 30% at 2 hours. IMPRESSION: Moderate delay in gastric emptying.      . Depression   . Depression with anxiety 02/23/2007   See [facility] dated 05/06/2016.  IC (KGwenlyn Saranwas called in and I did a brief assessment.   . Diabetic gastroparesis ([facility]   /e-chart  . Diabetic ketoacidosis without coma associated with type 2 diabetes mellitus ([facility]  . DKA (diabetic ketoacidoses) ([facility]8/07/2016  . Dysuria 10/01/2016  . Elevated cholesterol 11/28/2015  . Emotional stress 12/30/2015   Stress after Breakup with partnes of 13 years on 12/27/2015   . Encounter for chronic pain management 01/06/2015   Indication for chronic opioid: Chronic back pain, cyclic vomiting Medication and dose: oxycodone 92m592m# pills per month: 15 Last UDS date: 05/08/2015 - result pending Pain contract signed (Y/N): Yes, reviewed 01/06/2015  Date narcotic database last reviewed (include red flags): 05/08/2015 (no red flags).    . Esophagitis 04/18/2013   Documented by Dr. PerHenrene PastorD 03/2011  He also consulted on her during hospitalization 03/72/5366r cyclical vomiting. No repeat EGD necessary, treat esophagitis with PPI.    . EMarland Kitchensential hypertension 05/01/2016  . Essential hypertension, malignant 02/23/2007   Jan 2015 - stopped recently-started norvasc due to reports of GI symptoms. 02/2015 - holding lisinopril, adding hydralazine 59m60mD, and will take 1/2 tab lisinopril when feeling better when not in cyclic vomiting flare   . Essential hypertension, malignant 02/23/2007   Jan 2015 - stopped recently-started norvasc due to reports of GI symptoms. 02/2015 - holding lisinopril, adding hydralazine 59mg68m, and will take 1/2 tab lisinopril when feeling better when not in cyclic vomiting flare   . GERD (gastroesophageal reflux disease)   . Heart murmur   . Hematuria 07/09/2015  . High risk social  situation 08/31/2012  . Hot flashes 07/29/2011  . Hypertension   . INSOMNIA NOS 02/23/2007   Qualifier: Diagnosis of  By: HarreBeryle Lathe Intermittent constipation 06/06/2015  . Intestinal impaction (HCC) [place] Intractable vomiting 07/02/2016  . Marijuana smoker, continuous    pt. denies does smoke marijuana  . MIGRAINE, UNSPEC., W/O INTRACTABLE MIGRAINE 02/23/2007   Qualifier: Diagnosis of  By: HarreBeryle Lathe Myocardial infarction (HCC)[facility]008   "they say I've had a silent one; I don't know"  . Nausea and vomiting in adult 06/06/2013  . Neuromuscular disorder (HCC)    neuropathy in both legs  . NEUROPATHY, DIABETIC 02/23/2007  . Nonspecific low back pain 04/10/2007   Back pain is chronic. Oxycodone per prior notes helps pt be more active. Reportedly uses 4 tab/week oxy for intermittent worsening of chronic back pain but mostly for cyclic vom to keep her out of hospital.    .  Normocytic anemia   . Onychomycosis 03/25/2015  . PANCREATITIS 05/09/2008  . Pneumonia   . Protein-calorie malnutrition, severe ([place]) 12/17/2013  . Pure hypercholesterolemia 08/15/2014   Based on her choleserol, BP, smoker status, and that she is a diabetic, her 10 year ASCVD risk is 9.9% putting her in a category of risk that would benefit from high-intensity statin.    . Rotator cuff syndrome 04/19/2014   rt. shoulder  . Smoker   . TOBACCO DEPENDENCE 02/23/2007   2 cigarettes every few days 11/2012 3 cigarettes daily    . Type II diabetes mellitus ([place])   . Uncontrolled secondary diabetes mellitus with stage 3 CKD (GFR 30-59) (HCC) 10/23/2011   Nephropathy, gastroparesis  HgbA1c (06/2011) 7.6, 9.5, 12.2, 8.2, 10.9 (07/2012), 9.2 (02/11/2013), 7.7 (09/27/13)  Metformin: she has a history of cyclical vomiting; she would prefer not to take this medication  On lanuts 20U qam with improved control. 09/27/13   . Vitamin D deficiency 11/28/2014   Past Surgical History:  Procedure Laterality Date  . ABDOMINAL  HYSTERECTOMY  02/2002  . CARDIAC CATHETERIZATION    . CATARACT EXTRACTION W/ INTRAOCULAR LENS  IMPLANT, BILATERAL    . CESAREAN SECTION     x 3  . CHOLECYSTECTOMY  1980's  . COLONOSCOPY     patient not sure  . DILATION AND EVACUATION  X3  . laparotomy and lysis of adhesions    . left hand surgery    . PORT-A-CATH placement  ~ 2008   right chest; "poor access; frequent hospitalizations"  . SMALL INTESTINE SURGERY     laporotomy with lysis of adhesions.  Marland Kitchen UPPER GASTROINTESTINAL ENDOSCOPY    . UPPER GI ENDOSCOPY     Family History  Problem Relation Age of Onset  . Diabetes type II Mother   . Diabetes Mother   . Hypertension Mother   . Heart attack Mother   . Diabetes type II Sister   . Diabetes Sister   . Hypertension Sister   . Alcohol abuse Father   . Diabetes Sister   . Diabetes Sister   . Cancer Sister 1       Brain  . Colon cancer Neg Hx   . Colon polyps Neg Hx   . Esophageal cancer Neg Hx   . Rectal cancer Neg Hx   . Stomach cancer Neg Hx    Social History   Occupational History  . Not on file.   Social History Main Topics  . Smoking status: Current Some Day Smoker    Packs/day: 3.00    Years: 22.00    Types: Cigarettes    Start date: 12/27/1982  . Smokeless tobacco: Never Used     Comment: THC 1-2 times per day almost daily  . Alcohol use 0.0 oz/week     Comment: rarely a beer every 6 months  . Drug use: Yes    Types: Marijuana     Comment: Current Daily to weekly use  . Sexual activity: Yes    Partners: Male    Birth control/ protection: None    Tobacco Counseling Ready to quit: Yes Counseling given: Yes Patient given information on 1-800-QUIT-NOW and referral form faxed to [place] of Daily Living In your present state of health, do you have any difficulty performing the following activities: 09/28/2017  Hearing? N  Vision? Y  Difficulty concentrating or making decisions? N  Walking or climbing stairs? N  Dressing or bathing? N    Doing errands, shopping?  N  Preparing Food and eating ? Y  Using the Toilet? N  In the past six months, have you accidently leaked urine? N  Do you have problems with loss of bowel control? Y  Managing your Medications? N  Managing your Finances? N  Housekeeping or managing your Housekeeping? N  Some recent data might be hidden   Home Safety:  My home has a working smoke alarm:  Yes X 4           My home throw rugs have been fastened down to the floor or removed:  Non-slip backs I have a non-slip surface or non-slip mats in the bathtub and shower:  Mat        All my home's stairs have handrails, including any outdoor stairs  Split level home with handrails inside. 2 outside steps without handrail        My home's floors, stairs and hallways are free from clutter, wires and cords:  Yes     I have animals in my home  No I wear seatbelts consistently:  Yes   Immunizations and Health Maintenance Immunization History  Administered Date(s) Administered  . Influenza Split 09/08/2012, 09/29/2015  . Influenza Whole 09/25/2008  . Influenza,inj,Quad PF,6+ Mos 09/27/2013, 09/18/2014, 09/29/2015, 08/26/2016, 09/01/2017  . Pneumococcal Polysaccharide-23 01/20/2017  . Pneumococcal-Unspecified 09/26/2009  . Td 06/27/2003  . Tdap 09/28/2013   Health Maintenance Due  Topic Date Due  . COLONOSCOPY  01/21/2015  . OPHTHALMOLOGY EXAM  07/08/2017   Colonoscopy sched 11/03/2017 Eye Appt sched Nov 2018  Patient Care Team: McDiarmid, Blane Ohara, MD as PCP - General (Family Medicine) Rexene Agent, MD as Attending Physician (Nephrology) Irene Shipper, MD as Consulting Physician (Gastroenterology) Gean Birchwood, DPM as Consulting Physician (Podiatry) Lorretta Harp, MD as Consulting Physician (Cardiology) Rutherford Guys, MD as Consulting Physician (Ophthalmology) Zebedee Iba, CMA  Indicate any recent Medical Services you may have received from other than [facility] providers in the past year  (date may be approximate).     Assessment:   This is a routine wellness examination for [place].   Hearing/Vision screen  Hearing Screening   Method: Audiometry   '125Hz'  '250Hz'  '500Hz'  '1000Hz'  '2000Hz'  '3000Hz'  '4000Hz'  '6000Hz'  '8000Hz'   Right ear:   '25 25 25  25    ' Left ear:   '25 25 25  25      ' Dietary issues and exercise activities discussed: Current Exercise Habits: The patient does not participate in regular exercise at present, Exercise limited by: orthopedic condition(s) (Muscle cramps)  Goals    . Blood Pressure < 140/90    . HEMOGLOBIN A1C < 8    . LDL CALC < 100    . Quit smoking / using tobacco      Depression Screen PHQ 2/9 Scores 09/28/2017 09/01/2017 07/21/2017 05/26/2017 04/21/2017 03/10/2017 01/20/2017  PHQ - 2 Score 2 0 0 0 0 0 0  PHQ- 9 Score 11 - - - - - -  Warm hand off to In-house LCSW. PCP made aware.  Fall Risk Fall Risk  09/28/2017 08/06/2016 10/15/2015 11/06/2014 09/27/2013  Falls in the past year? No No No No Yes  Number falls in past yr: - - - - 1  Risk for fall due to : - (No Data) - - -  Risk for fall due to: Comment - No Fall Rsisks identified today - - -   TUG Test:  Done in 10 seconds. Falls prevention discussed in detail and literature  given.  Cognitive Function: Mini-Cog  Passed with score 4/5   Screening Tests Health Maintenance  Topic Date Due  . COLONOSCOPY  01/21/2015  . OPHTHALMOLOGY EXAM  07/08/2017  . HEMOGLOBIN A1C  01/21/2018  . FOOT EXAM  05/26/2018  . MAMMOGRAM  09/02/2019  . TETANUS/TDAP  09/29/2023  . PNEUMOCOCCAL POLYSACCHARIDE VACCINE  Completed  . INFLUENZA VACCINE  Completed  . HIV Screening  Completed      Plan:     HIV drawn today for risk. Appt made with PCP for further STI testing and to discuss back pain Warm hand off to LCSW for PHQ-9 score of 11 and food resources Colonoscopy sched 11/03/2017 Eye Appt sched Nov 2018   I have personally reviewed and noted the following in the patient's chart:   . Medical and social  history . Use of alcohol, tobacco or illicit drugs  . Current medications and supplements . Functional ability and status . Nutritional status . Physical activity . Advanced directives . List of other physicians . Hospitalizations, surgeries, and ER visits in previous 12 months . Vitals . Screenings to include cognitive, depression, and falls . Referrals and appointments  In addition, I have reviewed and discussed with patient certain preventive protocols, quality metrics, and best practice recommendations. A written personalized care plan for preventive services as well as general preventive health recommendations were provided to patient.     Velora Heckler, RN   09/28/2017

## 2017-09-28 NOTE — Progress Notes (Signed)
ESTIMATE TIME:20 minutes Type of Service: [place] warm handoff  Interpreter:No.    SUBJECTIVE: [name] [name] is a 51 y.o. female referred by Ander Purpura, RN for: symptoms of  depression with PHQ-9 of 11 indication of moderate depression. Stressors related to: financial concerns, employment concerns and relationship issues.   Patient reports main concerns: are her finances.  She is currently receiving disability and would like to work.  Patient has a Hx of outpatient therapy 3 years for one year, no inpatient treatment. Duration of problem:  Since she stopped working at [employer] in March Current or Hx of substance use: Patient denies use  LIFE CONTEXT:  [name] lives alone , daughter and grandchildren are her support system,has car for transportation,   Higher education careers adviser Work: patient receives [employer] disability,   Life changes: Patient would like to quit smoking,   GOALS: Patient will reduce symptoms of: depression and stress, and ability TM:LYYTKP skills, self-management skills and stress reduction, Increase healthy adjustment to current life circumstances.  INTERVENTION:  Solution-Focused Strategies and Mindfulness or Psychologist, educational, Reflective listening, Behavioral Therapy (Relaxed breathing), Community Resource   ISSUES DISCUSSED: support system, coping skills , top stressors, demonstration of relaxed breathing and review of community resources    ASSESSMENT:Patient currently experiencing symptoms of depression.  Symptoms exacerbated by financial stressors. Patient may benefit from, and is in agreement to receive further assessment and brief therapeutic interventions to assist with managing her symptoms.   PLAN:   .Patient will F/U with LCSW in 2 weeks . LCSW will F/U with phone call if patient does not keep appointment . Behavioral recommendations: relaxed breathing . Referral:[employer] [phone],   Warm Hand Off Completed.     [name],  LCSW Licensed Clinical Social Worker [place]   [phone] 1:53 PM

## 2017-09-28 NOTE — Patient Instructions (Addendum)
Ms. Servantes,  Thank you for taking time to come for yourMedicare Wellness Visit. I appreciate your ongoing commitment to your health goals. Please review the following plan we discussed and let me know if I can assist you in the future.   These are the goals we discussed:  Goals    . Blood Pressure < 140/90    . HEMOGLOBIN A1C < 8    . LDL CALC < 100    . Quit smoking / using tobacco       Diabetes and Foot Care Diabetes may cause you to have problems because of poor blood supply (circulation) to your feet and legs. This may cause the skin on your feet to become thinner, break easier, and heal more slowly. Your skin may become dry, and the skin may peel and crack. You may also have nerve damage in your legs and feet causing decreased feeling in them. You may not notice minor injuries to your feet that could lead to infections or more serious problems. Taking care of your feet is one of the most important things you can do for yourself. Follow these instructions at home:  Wear shoes at all times, even in the house. Do not go barefoot. Bare feet are easily injured.  Check your feet daily for blisters, cuts, and redness. If you cannot see the bottom of your feet, use a mirror or ask someone for help.  Wash your feet with warm water (do not use hot water) and mild soap. Then pat your feet and the areas between your toes until they are completely dry. Do not soak your feet as this can dry your skin.  Apply a moisturizing lotion or petroleum jelly (that does not contain alcohol and is unscented) to the skin on your feet and to dry, brittle toenails. Do not apply lotion between your toes.  Trim your toenails straight across. Do not dig under them or around the cuticle. File the edges of your nails with an emery board or nail file.  Do not cut corns or calluses or try to remove them with medicine.  Wear clean socks or stockings every day. Make sure they are not too tight. Do not wear  knee-high stockings since they may decrease blood flow to your legs.  Wear shoes that fit properly and have enough cushioning. To break in new shoes, wear them for just a few hours a day. This prevents you from injuring your feet. Always look in your shoes before you put them on to be sure there are no objects inside.  Do not cross your legs. This may decrease the blood flow to your feet.  If you find a minor scrape, cut, or break in the skin on your feet, keep it and the skin around it clean and dry. These areas may be cleansed with mild soap and water. Do not cleanse the area with peroxide, alcohol, or iodine.  When you remove an adhesive bandage, be sure not to damage the skin around it.  If you have a wound, look at it several times a day to make sure it is healing.  Do not use heating pads or hot water bottles. They may burn your skin. If you have lost feeling in your feet or legs, you may not know it is happening until it is too late.  Make sure your health care provider performs a complete foot exam at least annually or more often if you have foot problems. Report any cuts, sores,  or bruises to your health care provider immediately. Contact a health care provider if:  You have an injury that is not healing.  You have cuts or breaks in the skin.  You have an ingrown nail.  You notice redness on your legs or feet.  You feel burning or tingling in your legs or feet.  You have pain or cramps in your legs and feet.  Your legs or feet are numb.  Your feet always feel cold. Get help right away if:  There is increasing redness, swelling, or pain in or around a wound.  There is a red line that goes up your leg.  Pus is coming from a wound.  You develop a fever or as directed by your health care provider.  You notice a bad smell coming from an ulcer or wound. This information is not intended to replace advice given to you by your health care provider. Make sure you discuss any  questions you have with your health care provider. Document Released: 12/10/2000 Document Revised: 05/20/2016 Document Reviewed: 05/22/2013 Elsevier Interactive Patient Education  2017 [place] Prevention in the Home Falls can cause injuries. They can happen to people of all ages. There are many things you can do to make your home safe and to help prevent falls. What can I do on the outside of my home?  Regularly fix the edges of walkways and driveways and fix any cracks.  Remove anything that might make you trip as you walk through a door, such as a raised step or threshold.  Trim any bushes or trees on the path to your home.  Use bright outdoor lighting.  Clear any walking paths of anything that might make someone trip, such as rocks or tools.  Regularly check to see if handrails are loose or broken. Make sure that both sides of any steps have handrails.  Any raised decks and porches should have guardrails on the edges.  Have any leaves, snow, or ice cleared regularly.  Use sand or salt on walking paths during winter.  Clean up any spills in your garage right away. This includes oil or grease spills. What can I do in the bathroom?  Use night lights.  Install grab bars by the toilet and in the tub and shower. Do not use towel bars as grab bars.  Use non-skid mats or decals in the tub or shower.  If you need to sit down in the shower, use a plastic, non-slip stool.  Keep the floor dry. Clean up any water that spills on the floor as soon as it happens.  Remove soap buildup in the tub or shower regularly.  Attach bath mats securely with double-sided non-slip rug tape.  Do not have throw rugs and other things on the floor that can make you trip. What can I do in the bedroom?  Use night lights.  Make sure that you have a light by your bed that is easy to reach.  Do not use any sheets or blankets that are too big for your bed. They should not hang down onto  the floor.  Have a firm chair that has side arms. You can use this for support while you get dressed.  Do not have throw rugs and other things on the floor that can make you trip. What can I do in the kitchen?  Clean up any spills right away.  Avoid walking on wet floors.  Keep items that you use a  lot in easy-to-reach places.  If you need to reach something above you, use a strong step stool that has a grab bar.  Keep electrical cords out of the way.  Do not use floor polish or wax that makes floors slippery. If you must use wax, use non-skid floor wax.  Do not have throw rugs and other things on the floor that can make you trip. What can I do with my stairs?  Do not leave any items on the stairs.  Make sure that there are handrails on both sides of the stairs and use them. Fix handrails that are broken or loose. Make sure that handrails are as long as the stairways.  Check any carpeting to make sure that it is firmly attached to the stairs. Fix any carpet that is loose or worn.  Avoid having throw rugs at the top or bottom of the stairs. If you do have throw rugs, attach them to the floor with carpet tape.  Make sure that you have a light switch at the top of the stairs and the bottom of the stairs. If you do not have them, ask someone to add them for you. What else can I do to help prevent falls?  Wear shoes that: ? Do not have high heels. ? Have rubber bottoms. ? Are comfortable and fit you well. ? Are closed at the toe. Do not wear sandals.  If you use a stepladder: ? Make sure that it is fully opened. Do not climb a closed stepladder. ? Make sure that both sides of the stepladder are locked into place. ? Ask someone to hold it for you, if possible.  Clearly mark and make sure that you can see: ? Any grab bars or handrails. ? First and last steps. ? Where the edge of each step is.  Use tools that help you move around (mobility aids) if they are needed. These  include: ? Canes. ? Walkers. ? Scooters. ? Crutches.  Turn on the lights when you go into a dark area. Replace any light bulbs as soon as they burn out.  Set up your furniture so you have a clear path. Avoid moving your furniture around.  If any of your floors are uneven, fix them.  If there are any pets around you, be aware of where they are.  Review your medicines with your doctor. Some medicines can make you feel dizzy. This can increase your chance of falling. Ask your doctor what other things that you can do to help prevent falls. This information is not intended to replace advice given to you by your health care provider. Make sure you discuss any questions you have with your health care provider. Document Released: 10/09/2009 Document Revised: 05/20/2016 Document Reviewed: 01/17/2015 Elsevier Interactive Patient Education  2018 [place] Maintenance, Female Adopting a healthy lifestyle and getting preventive care can go a long way to promote health and wellness. Talk with your health care provider about what schedule of regular examinations is right for you. This is a good chance for you to check in with your provider about disease prevention and staying healthy. In between checkups, there are plenty of things you can do on your own. Experts have done a lot of research about which lifestyle changes and preventive measures are most likely to keep you healthy. Ask your health care provider for more information. Weight and diet Eat a healthy diet  Be sure to include plenty of vegetables, fruits, low-fat dairy  products, and lean protein.  Do not eat a lot of foods high in solid fats, added sugars, or salt.  Get regular exercise. This is one of the most important things you can do for your health. ? Most adults should exercise for at least 150 minutes each week. The exercise should increase your heart rate and make you sweat (moderate-intensity exercise). ? Most adults  should also do strengthening exercises at least twice a week. This is in addition to the moderate-intensity exercise.  Maintain a healthy weight  Body mass index (BMI) is a measurement that can be used to identify possible weight problems. It estimates body fat based on height and weight. Your health care provider can help determine your BMI and help you achieve or maintain a healthy weight.  For females 37 years of age and older: ? A BMI below 18.5 is considered underweight. ? A BMI of 18.5 to 24.9 is normal. ? A BMI of 25 to 29.9 is considered overweight. ? A BMI of 30 and above is considered obese.  Watch levels of cholesterol and blood lipids  You should start having your blood tested for lipids and cholesterol at 52 years of age, then have this test every 5 years.  You may need to have your cholesterol levels checked more often if: ? Your lipid or cholesterol levels are high. ? You are older than 51 years of age. ? You are at high risk for heart disease.  Cancer screening Lung Cancer  Lung cancer screening is recommended for adults 47-52 years old who are at high risk for lung cancer because of a history of smoking.  A yearly low-dose CT scan of the lungs is recommended for people who: ? Currently smoke. ? Have quit within the past 15 years. ? Have at least a 30-pack-year history of smoking. A pack year is smoking an average of one pack of cigarettes a day for 1 year.  Yearly screening should continue until it has been 15 years since you quit.  Yearly screening should stop if you develop a health problem that would prevent you from having lung cancer treatment.  Breast Cancer  Practice breast self-awareness. This means understanding how your breasts normally appear and feel.  It also means doing regular breast self-exams. Let your health care provider know about any changes, no matter how small.  If you are in your 20s or 30s, you should have a clinical breast exam (CBE)  by a health care provider every 1-3 years as part of a regular health exam.  If you are 27 or older, have a CBE every year. Also consider having a breast X-ray (mammogram) every year.  If you have a family history of breast cancer, talk to your health care provider about genetic screening.  If you are at high risk for breast cancer, talk to your health care provider about having an MRI and a mammogram every year.  Breast cancer gene (BRCA) assessment is recommended for women who have family members with BRCA-related cancers. BRCA-related cancers include: ? Breast. ? Ovarian. ? Tubal. ? Peritoneal cancers.  Results of the assessment will determine the need for genetic counseling and BRCA1 and BRCA2 testing.  Cervical Cancer Your health care provider may recommend that you be screened regularly for cancer of the pelvic organs (ovaries, uterus, and vagina). This screening involves a pelvic examination, including checking for microscopic changes to the surface of your cervix (Pap test). You may be encouraged to have this screening done  every 3 years, beginning at age 107.  For women ages 7-65, health care providers may recommend pelvic exams and Pap testing every 3 years, or they may recommend the Pap and pelvic exam, combined with testing for human papilloma virus (HPV), every 5 years. Some types of HPV increase your risk of cervical cancer. Testing for HPV may also be done on women of any age with unclear Pap test results.  Other health care providers may not recommend any screening for nonpregnant women who are considered low risk for pelvic cancer and who do not have symptoms. Ask your health care provider if a screening pelvic exam is right for you.  If you have had past treatment for cervical cancer or a condition that could lead to cancer, you need Pap tests and screening for cancer for at least 20 years after your treatment. If Pap tests have been discontinued, your risk factors (such as  having a new sexual partner) need to be reassessed to determine if screening should resume. Some women have medical problems that increase the chance of getting cervical cancer. In these cases, your health care provider may recommend more frequent screening and Pap tests.  Colorectal Cancer  This type of cancer can be detected and often prevented.  Routine colorectal cancer screening usually begins at 52 years of age and continues through 52 years of age.  Your health care provider may recommend screening at an earlier age if you have risk factors for colon cancer.  Your health care provider may also recommend using home test kits to check for hidden blood in the stool.  A small camera at the end of a tube can be used to examine your colon directly (sigmoidoscopy or colonoscopy). This is done to check for the earliest forms of colorectal cancer.  Routine screening usually begins at age 67.  Direct examination of the colon should be repeated every 5-10 years through 52 years of age. However, you may need to be screened more often if early forms of precancerous polyps or small growths are found.  Skin Cancer  Check your skin from head to toe regularly.  Tell your health care provider about any new moles or changes in moles, especially if there is a change in a mole's shape or color.  Also tell your health care provider if you have a mole that is larger than the size of a pencil eraser.  Always use sunscreen. Apply sunscreen liberally and repeatedly throughout the day.  Protect yourself by wearing long sleeves, pants, a wide-brimmed hat, and sunglasses whenever you are outside.  Heart disease, diabetes, and high blood pressure  High blood pressure causes heart disease and increases the risk of stroke. High blood pressure is more likely to develop in: ? People who have blood pressure in the high end of the normal range (130-139/85-89 mm Hg). ? People who are overweight or  obese. ? People who are African American.  If you are 56-61 years of age, have your blood pressure checked every 3-5 years. If you are 75 years of age or older, have your blood pressure checked every year. You should have your blood pressure measured twice-once when you are at a hospital or clinic, and once when you are not at a hospital or clinic. Record the average of the two measurements. To check your blood pressure when you are not at a hospital or clinic, you can use: ? An automated blood pressure machine at a pharmacy. ? A home blood pressure  monitor.  If you are between 90 years and 23 years old, ask your health care provider if you should take aspirin to prevent strokes.  Have regular diabetes screenings. This involves taking a blood sample to check your fasting blood sugar level. ? If you are at a normal weight and have a low risk for diabetes, have this test once every three years after 52 years of age. ? If you are overweight and have a high risk for diabetes, consider being tested at a younger age or more often. Preventing infection Hepatitis B  If you have a higher risk for hepatitis B, you should be screened for this virus. You are considered at high risk for hepatitis B if: ? You were born in a country where hepatitis B is common. Ask your health care provider which countries are considered high risk. ? Your parents were born in a high-risk country, and you have not been immunized against hepatitis B (hepatitis B vaccine). ? You have HIV or AIDS. ? You use needles to inject street drugs. ? You live with someone who has hepatitis B. ? You have had sex with someone who has hepatitis B. ? You get hemodialysis treatment. ? You take certain medicines for conditions, including cancer, organ transplantation, and autoimmune conditions.  Hepatitis C  Blood testing is recommended for: ? Everyone born from [birth year] through 1966. ? Anyone with known risk factors for hepatitis  C.  Sexually transmitted infections (STIs)  You should be screened for sexually transmitted infections (STIs) including gonorrhea and chlamydia if: ? You are sexually active and are younger than 52 years of age. ? You are older than 52 years of age and your health care provider tells you that you are at risk for this type of infection. ? Your sexual activity has changed since you were last screened and you are at an increased risk for chlamydia or gonorrhea. Ask your health care provider if you are at risk.  If you do not have HIV, but are at risk, it may be recommended that you take a prescription medicine daily to prevent HIV infection. This is called pre-exposure prophylaxis (PrEP). You are considered at risk if: ? You are sexually active and do not regularly use condoms or know the HIV status of your partner(s). ? You take drugs by injection. ? You are sexually active with a partner who has HIV.  Talk with your health care provider about whether you are at high risk of being infected with HIV. If you choose to begin PrEP, you should first be tested for HIV. You should then be tested every 3 months for as long as you are taking PrEP. Pregnancy  If you are premenopausal and you may become pregnant, ask your health care provider about preconception counseling.  If you may become pregnant, take 400 to 800 micrograms (mcg) of folic acid every day.  If you want to prevent pregnancy, talk to your health care provider about birth control (contraception). Osteoporosis and menopause  Osteoporosis is a disease in which the bones lose minerals and strength with aging. This can result in serious bone fractures. Your risk for osteoporosis can be identified using a bone density scan.  If you are 49 years of age or older, or if you are at risk for osteoporosis and fractures, ask your health care provider if you should be screened.  Ask your health care provider whether you should take a calcium or  vitamin D supplement to lower your risk  for osteoporosis.  Menopause may have certain physical symptoms and risks.  Hormone replacement therapy may reduce some of these symptoms and risks. Talk to your health care provider about whether hormone replacement therapy is right for you. Follow these instructions at home:  Schedule regular health, dental, and eye exams.  Stay current with your immunizations.  Do not use any tobacco products including cigarettes, chewing tobacco, or electronic cigarettes.  If you are pregnant, do not drink alcohol.  If you are breastfeeding, limit how much and how often you drink alcohol.  Limit alcohol intake to no more than 1 drink per day for nonpregnant women. One drink equals 12 ounces of beer, 5 ounces of wine, or 1 ounces of hard liquor.  Do not use street drugs.  Do not share needles.  Ask your health care provider for help if you need support or information about quitting drugs.  Tell your health care provider if you often feel depressed.  Tell your health care provider if you have ever been abused or do not feel safe at home. This information is not intended to replace advice given to you by your health care provider. Make sure you discuss any questions you have with your health care provider. Document Released: 06/28/2011 Document Revised: 05/20/2016 Document Reviewed: 09/16/2015 Elsevier Interactive Patient Education  2018 [employer].   Steps to Quit Smoking Smoking tobacco can be bad for your health. It can also affect almost every organ in your body. Smoking puts you and people around you at risk for many serious long-lasting (chronic) diseases. Quitting smoking is hard, but it is one of the best things that you can do for your health. It is never too late to quit. What are the benefits of quitting smoking? When you quit smoking, you lower your risk for getting serious diseases and conditions. They can include:  Lung cancer or lung  disease.  Heart disease.  Stroke.  Heart attack.  Not being able to have children (infertility).  Weak bones (osteoporosis) and broken bones (fractures).  If you have coughing, wheezing, and shortness of breath, those symptoms may get better when you quit. You may also get sick less often. If you are pregnant, quitting smoking can help to lower your chances of having a baby of low birth weight. What can I do to help me quit smoking? Talk with your doctor about what can help you quit smoking. Some things you can do (strategies) include:  Quitting smoking totally, instead of slowly cutting back how much you smoke over a period of time.  Going to in-person counseling. You are more likely to quit if you go to many counseling sessions.  Using resources and support systems, such as: ? Database administrator with a Social worker. ? Phone quitlines. ? Careers information officer. ? Support groups or group counseling. ? Text messaging programs. ? Mobile phone apps or applications.  Taking medicines. Some of these medicines may have nicotine in them. If you are pregnant or breastfeeding, do not take any medicines to quit smoking unless your doctor says it is okay. Talk with your doctor about counseling or other things that can help you.  Talk with your doctor about using more than one strategy at the same time, such as taking medicines while you are also going to in-person counseling. This can help make quitting easier. What things can I do to make it easier to quit? Quitting smoking might feel very hard at first, but there is a lot that you  can do to make it easier. Take these steps:  Talk to your family and friends. Ask them to support and encourage you.  Call phone quitlines, reach out to support groups, or work with a Social worker.  Ask people who smoke to not smoke around you.  Avoid places that make you want (trigger) to smoke, such as: ? Bars. ? Parties. ? Smoke-break areas at work.  Spend  time with people who do not smoke.  Lower the stress in your life. Stress can make you want to smoke. Try these things to help your stress: ? Getting regular exercise. ? Deep-breathing exercises. ? Yoga. ? Meditating. ? Doing a body scan. To do this, close your eyes, focus on one area of your body at a time from head to toe, and notice which parts of your body are tense. Try to relax the muscles in those areas.  Download or buy apps on your mobile phone or tablet that can help you stick to your quit plan. There are many free apps, such as QuitGuide from the [employer] Office manager for Disease Control and Prevention). You can find more support from smokefree.gov and other websites.  This information is not intended to replace advice given to you by your health care provider. Make sure you discuss any questions you have with your health care provider. Document Released: 10/09/2009 Document Revised: 08/10/2016 Document Reviewed: 04/29/2015 Elsevier Interactive Patient Education  2018 [employer].

## 2017-09-29 LAB — HIV ANTIBODY (ROUTINE TESTING W REFLEX): HIV SCREEN 4TH GENERATION: NONREACTIVE

## 2017-10-03 ENCOUNTER — Encounter: Payer: Self-pay | Admitting: *Deleted

## 2017-10-03 DIAGNOSIS — R32 Unspecified urinary incontinence: Secondary | ICD-10-CM

## 2017-10-03 HISTORY — DX: Unspecified urinary incontinence: R32

## 2017-10-03 NOTE — Progress Notes (Signed)
I have reviewed this visit and discussed with Lauren Ducatte, RN, BSN, and agree with her documentation.   

## 2017-10-12 ENCOUNTER — Ambulatory Visit: Payer: Medicare Other

## 2017-10-12 ENCOUNTER — Other Ambulatory Visit: Payer: Self-pay | Admitting: Family Medicine

## 2017-10-12 MED ORDER — INSULIN GLARGINE 100 UNIT/ML SOLOSTAR PEN: 12.0000 [IU] | PEN_INJECTOR | Freq: Every day | SUBCUTANEOUS | 3 refills | Status: DC

## 2017-10-17 ENCOUNTER — Ambulatory Visit: Payer: Medicare Other

## 2017-10-17 ENCOUNTER — Other Ambulatory Visit: Payer: Self-pay | Admitting: Family Medicine

## 2017-10-17 DIAGNOSIS — Z794 Long term (current) use of insulin: Principal | ICD-10-CM

## 2017-10-17 DIAGNOSIS — E118 Type 2 diabetes mellitus with unspecified complications: Secondary | ICD-10-CM

## 2017-10-17 MED ORDER — INSULIN GLARGINE 100 UNIT/ML SOLOSTAR PEN: 12.0000 [IU] | PEN_INJECTOR | Freq: Every day | SUBCUTANEOUS | 1 refills | Status: DC

## 2017-10-19 ENCOUNTER — Other Ambulatory Visit: Payer: Self-pay | Admitting: Family Medicine

## 2017-10-19 ENCOUNTER — Ambulatory Visit (INDEPENDENT_AMBULATORY_CARE_PROVIDER_SITE_OTHER): Payer: Medicare Other | Admitting: Licensed Clinical Social Worker

## 2017-10-19 DIAGNOSIS — K3184 Gastroparesis: Secondary | ICD-10-CM

## 2017-10-19 DIAGNOSIS — R4589 Other symptoms and signs involving emotional state: Secondary | ICD-10-CM

## 2017-10-19 DIAGNOSIS — E118 Type 2 diabetes mellitus with unspecified complications: Secondary | ICD-10-CM

## 2017-10-19 DIAGNOSIS — E1143 Type 2 diabetes mellitus with diabetic autonomic (poly)neuropathy: Secondary | ICD-10-CM

## 2017-10-19 DIAGNOSIS — F329 Major depressive disorder, single episode, unspecified: Secondary | ICD-10-CM

## 2017-10-19 DIAGNOSIS — Z794 Long term (current) use of insulin: Principal | ICD-10-CM

## 2017-10-19 MED ORDER — BASAGLAR KWIKPEN 100 UNIT/ML ~~LOC~~ SOPN: 12.0000 [IU] | PEN_INJECTOR | Freq: Every day | SUBCUTANEOUS | 0 refills | Status: DC

## 2017-10-19 NOTE — Progress Notes (Signed)
  Total time:30 minutes Type of Service: Integrated Behavioral Health F/U Interpretor:No.   SUBJECTIVE: [name] [name] is a 51 y.o. female . Patient referred by Ander Purpura, RN after AWV.    Reason for follow-up: Continue brief intervention to assist with managing symptoms of depression, and stressors. Patient states she is not feeling well today due to chronic back pain. Recently ran out of medication and unable to get refill until Friday.  Appearance:Neat; Mood: Irritable ; Thought process: Coherent; Affect: Appropriate and Tearful at times Risk of harm to self or others: No plan to harm self or others Patient declined screening of PHQ-9 today.  GOALS: Patient will reduce symptoms of: depression , and increase ability SK:AJGOTL skills and self-management skills, Increase healthy adjustment to current life circumstances. Intervention: Solution-Focused Strategies, Mindfulness or Relaxation Training and Supportive Counseling, Reflective listening, Behavioral Therapy (Relaxed breathing demonstrated).    Issues discussed: previous and current coping skills; options for managing pain ;  What has worked to provide relief in the past, daily activities,  Vocational rehabilitation    ASSESSMENT:Patient continues to experience symptoms of depression and chronic pain.  Patient will pick up Rx for pain medication in two days.  Patient may benefit from, and is in agreement to continue further assessment and brief therapeutic interventions to assist with managing her symptoms.  Patient previously used therapy oils states this helped with relaxation however she does not have money to purchase them. She believes this will help with several of her symptoms as it has helped in the past.    LCSW provided patient $6.00 from [facility] indigent fund for therapy oil and $5.00 gas card to return for appointment 10/27/17 with PCP.  Patient appreciative of the assistance.    PLAN: 1. Patient will F/U with LCSW in two weeks when she  returns to see PCP 2. Behavioral recommendations: continue relaxed breathing,   3. Referral: [facility] [place] (In Clinic),  4. Patient will also call [facility] in [place], [place] Licensed Clinical Social Worker [place]   [phone] 2:04 PM

## 2017-10-19 NOTE — Telephone Encounter (Signed)
Patient came to office request refill on RX Oxycodone. Please let patient know when ready. [phone]

## 2017-10-21 ENCOUNTER — Other Ambulatory Visit: Payer: Self-pay | Admitting: Family Medicine

## 2017-10-21 DIAGNOSIS — G43A0 Cyclical vomiting, not intractable: Secondary | ICD-10-CM

## 2017-10-21 DIAGNOSIS — Z79891 Long term (current) use of opiate analgesic: Secondary | ICD-10-CM

## 2017-10-21 DIAGNOSIS — K3184 Gastroparesis: Secondary | ICD-10-CM

## 2017-10-21 DIAGNOSIS — E1143 Type 2 diabetes mellitus with diabetic autonomic (poly)neuropathy: Secondary | ICD-10-CM

## 2017-10-21 DIAGNOSIS — Z5181 Encounter for therapeutic drug level monitoring: Secondary | ICD-10-CM

## 2017-10-21 MED ORDER — OXYCODONE HCL 5 MG PO TABS: 5.0000 mg | ORAL_TABLET | ORAL | 0 refills | Status: DC | PRN

## 2017-10-21 NOTE — Telephone Encounter (Signed)
Please let patient know her prescription(s) are available for pick up from the [facility] front desk.

## 2017-10-21 NOTE — Telephone Encounter (Signed)
Pt contacted and informed of rx up front ready for pick up. Pt verbalized understanding.

## 2017-10-27 ENCOUNTER — Other Ambulatory Visit (HOSPITAL_COMMUNITY)
Admission: RE | Admit: 2017-10-27 | Discharge: 2017-10-27 | Disposition: A | Discharge status: Home / Self Care | Payer: Medicare Other | Source: Ambulatory Visit | Attending: Family Medicine | Admitting: Family Medicine

## 2017-10-27 ENCOUNTER — Ambulatory Visit (INDEPENDENT_AMBULATORY_CARE_PROVIDER_SITE_OTHER): Payer: Medicare Other | Admitting: Family Medicine

## 2017-10-27 ENCOUNTER — Encounter: Payer: Self-pay | Admitting: Family Medicine

## 2017-10-27 ENCOUNTER — Encounter: Payer: Self-pay | Admitting: Licensed Clinical Social Worker

## 2017-10-27 ENCOUNTER — Other Ambulatory Visit: Payer: Self-pay | Admitting: Family Medicine

## 2017-10-27 VITALS — BP 138/70 | HR 66 | Temp 98.3°F | Wt 140.0 lb

## 2017-10-27 DIAGNOSIS — E118 Type 2 diabetes mellitus with unspecified complications: Secondary | ICD-10-CM | POA: Diagnosis not present

## 2017-10-27 DIAGNOSIS — Z113 Encounter for screening for infections with a predominantly sexual mode of transmission: Secondary | ICD-10-CM | POA: Diagnosis not present

## 2017-10-27 DIAGNOSIS — Z5181 Encounter for therapeutic drug level monitoring: Secondary | ICD-10-CM | POA: Diagnosis not present

## 2017-10-27 DIAGNOSIS — Z794 Long term (current) use of insulin: Secondary | ICD-10-CM

## 2017-10-27 DIAGNOSIS — Z1239 Encounter for other screening for malignant neoplasm of breast: Secondary | ICD-10-CM

## 2017-10-27 DIAGNOSIS — I10 Essential (primary) hypertension: Secondary | ICD-10-CM

## 2017-10-27 DIAGNOSIS — F331 Major depressive disorder, recurrent, moderate: Secondary | ICD-10-CM

## 2017-10-27 DIAGNOSIS — G43A0 Cyclical vomiting, not intractable: Secondary | ICD-10-CM | POA: Diagnosis not present

## 2017-10-27 DIAGNOSIS — Z1231 Encounter for screening mammogram for malignant neoplasm of breast: Secondary | ICD-10-CM | POA: Diagnosis not present

## 2017-10-27 DIAGNOSIS — R350 Frequency of micturition: Secondary | ICD-10-CM

## 2017-10-27 DIAGNOSIS — Z79891 Long term (current) use of opiate analgesic: Secondary | ICD-10-CM | POA: Diagnosis not present

## 2017-10-27 LAB — POCT URINALYSIS DIP (MANUAL ENTRY)
BILIRUBIN UA: NEGATIVE mg/dL
Bilirubin, UA: NEGATIVE
Glucose, UA: NEGATIVE mg/dL
Leukocytes, UA: NEGATIVE
Nitrite, UA: NEGATIVE
PH UA: 6.5 (ref 5.0–8.0)
RBC UA: NEGATIVE
SPEC GRAV UA: 1.015 (ref 1.010–1.025)
UROBILINOGEN UA: 0.2 U/dL

## 2017-10-27 LAB — POCT UA - MICROSCOPIC ONLY

## 2017-10-27 LAB — POCT GLYCOSYLATED HEMOGLOBIN (HGB A1C): Hemoglobin A1C: 6.6

## 2017-10-27 MED ORDER — LORAZEPAM 1 MG PO TABS: 1.0000 mg | ORAL_TABLET | Freq: Four times a day (QID) | ORAL | 2 refills | Status: DC | PRN

## 2017-10-27 MED ORDER — DULOXETINE HCL 30 MG PO CPEP: 30.0000 mg | ORAL_CAPSULE | Freq: Every day | ORAL | 0 refills | Status: DC

## 2017-10-27 NOTE — Progress Notes (Signed)
POC

## 2017-10-27 NOTE — Patient Instructions (Addendum)
Use the sample of Lantus so you do not have to cut down your insulin dose.   You are starting on duloxetine (Cymbalta) to help with your sadness and back pain.  Dr Zakery Normington wants to see you back in about 3 to 4 weeks to see how you are tolerating the medication.  We may want to increase you dose of duloxetine to the full dose at this follow up visit.    Your A1c is 6.6%.  Great!!  Keep up your physical activity and improved diet :)  I am glad you have gotten your eye exam and colonoscopy scheduled.   Your mammogram is only screening test left.  [facility] will [facility] to schedule an appointment or you can call them to schedule your mammogram.

## 2017-10-27 NOTE — Progress Notes (Signed)
  Total time:15 minutes Type of Service: [facility] Visit  SUBJECTIVE: [name] [name] is a 51 y.o. female . Patient referred by Dr. McDiarmid for continued intervention .    Reason for follow-up: Continue brief intervention to assist with managing symptoms of depression, and stressors. Patient is pleasant and engaged in conversation states she is feeling better today.  PHQ-9 today is 11 indication of moderately Severe depression. Patient denies SI and feeling of hopelessness.  GOALS: Patient will reduce symptoms of: depression , and increase ability AN:VBTYOM skills and self-management skills, Increase healthy adjustment to current life circumstances. Intervention: Mindfulness & Psychologist, educational, Supportive Counseling, Reflective listening and Motivational Interviewing  Issues discussed: current coping skills, managing pain; Ambivalence with starting Cymbalta, share how she uses aromatherapy with the oils she purchased and daily activities   ASSESSMENT:Patient continues to experience symptoms of depression with chronic pain.  Patient may benefit from, and is in agreement to continue further assessment and brief therapeutic interventions to assist with managing her symptoms.    PLAN: 1. Patient will F/U with LCSW in two weeks when she returns to see PCP 2. Behavioral recommendations: continue relaxed breathing, meditation and behavioral activation 3. Referral: continue with [facility] (In Clinic),   [name], LCSW Licensed Clinical Social Worker [facility]   [phone] 2:04 PM

## 2017-10-28 LAB — URINE CYTOLOGY ANCILLARY ONLY
CHLAMYDIA, DNA PROBE: NEGATIVE
Neisseria Gonorrhea: NEGATIVE
TRICH (WINDOWPATH): NEGATIVE

## 2017-10-28 NOTE — Assessment & Plan Note (Signed)
Adequate blood pressure control.  No evidence of new end organ damage.  Tolerating medication without significant adverse effects.  Plan to continue current blood pressure regiment.   

## 2017-10-28 NOTE — Assessment & Plan Note (Signed)
Established problem. Stable. Continue current therapy Renewed Ativan prn after reviewing [place] [facility] and found no evidence of doctor shopping.

## 2017-10-28 NOTE — Progress Notes (Signed)
   Subjective:    Patient ID: [name] [name], female    DOB: 02/12/1966, 51 y.o.   MRN: [id number] [name] [name] is alone Sources of clinical information for visit is/are patient and past medical records. Nursing assessment for this office visit was reviewed with the patient for accuracy and revision.  Depression screen PHQ 2/9 10/27/2017  Decreased Interest 1  Down, Depressed, Hopeless 1  PHQ - 2 Score 2  Altered sleeping 3  Tired, decreased energy 1  Change in appetite 3  Feeling bad or failure about yourself  1  Trouble concentrating 1  Moving slowly or fidgety/restless 0  Suicidal thoughts 0  PHQ-9 Score 11  Difficult doing work/chores Somewhat difficult  Some recent data might be hidden   Fall Risk  09/28/2017 08/06/2016 10/15/2015 11/06/2014 09/27/2013  Falls in the past year? No No No No Yes  Number falls in past yr: - - - - 1  Risk for fall due to : - (No Data) - - -  Risk for fall due to: Comment - No Fall Rsisks identified today - - -    HPI  Cyclic Vomiting - longstanding issue - Trial of Ativan 1 mg prn has improved her ability to terminate an vomiting attack.   - She has been having thoracolumbar midback pain with riadiation around right side to epigasric area that preceds vomiting episodes. She has used oxycodone for these events which has helped stop them from progressing to cyclic vomiting - No ED visits since last ov - Has used oxycodone when back pain prodrome to exacerbations occur with good success.   - Review of  CSRS today found no evidence of docotr shopping.   DMT2 - Home CBGs in low mid-100s -no hypoglyemic episodes - taking Lantus 12 units each morning but last week has had to cut dose in have because of running out  Of lantus early. - no chest pain, no claudication, no limb weakness nor numbness  CHRONIC HYPERTENSION  Disease Monitoring  Blood pressure range: not checking at home  Chest pain: no   Dyspnea: no   Claudication: no    Medication compliance: yes, Carvedilol 6.25 mg BID  Medication Side Effects  Lightheadedness: no   Urinary frequency: no   Edema: no    Preventitive Healthcare:  Exercise: no   Diet Pattern: fair  Salt Restriction: no    .DEPRESSION Disease Monitoring Current symptoms include fatigue, loss of appetite, difficulty falling asleep.           Symptoms have been gradually worsening     Is Exercising not  Evidence of suicidal ideation: none Pt concerned that her mood will continue to decline as season and shortened days progress.  PMH: Prior treatment with Elavil in 1990 but stopped it bc did not like side effects.  See PHQ9 details PHQ-9: 11 score total Has begun meeting with Ms Laurance Flatten, MSW  SH: continues to smoke.  In contemplative phase of change.   Review of Systems  Musculoskeletal: Positive for back pain.  No headache No fever    Objective:   Physical Exam VS reviewed GEN: Alert, Cooperative, Groomed, NAD COR: RRR, No M/G/R, No JVD, Normal PMI size and location LUNGS: BCTA, No Acc mm use, speaking in full sentences Gait: Normal speed, No significant path deviation, Step through +,  Psych: mildly sad affect but brightened up during course of interview/thought/speech/language    Assessment & Plan:  See problem list

## 2017-10-28 NOTE — Assessment & Plan Note (Signed)
Established problem worsened.  [name] [name] is anticipating further decline in mood as days shorten. She is in conversation with [name] [name], MSW, LCSW Will start duloxetine 30 mg daily for depressed mood with fatigue and chronic back pain RTC in 3 -4 weeks to assess tolerance and titration up dose if possible.

## 2017-10-28 NOTE — Assessment & Plan Note (Addendum)
Lab Results  Component Value Date   HGBA1C 6.6 10/27/2017  Established problem that has improved.  Continue current regiment of Basaglar 12 units daily.

## 2017-11-02 ENCOUNTER — Telehealth: Payer: Self-pay | Admitting: Internal Medicine

## 2017-11-02 NOTE — Telephone Encounter (Signed)
Noted  

## 2017-11-03 ENCOUNTER — Encounter: Payer: Medicare Other | Admitting: Internal Medicine

## 2017-11-08 ENCOUNTER — Telehealth: Payer: Self-pay | Admitting: Licensed Clinical Social Worker

## 2017-11-08 NOTE — Progress Notes (Signed)
Integrated Care f/u phone call to patient reference interventions discussed during joint visit with PCP. Patient was hesitant and reluctant about starting mood medication but indicated she was willing to try.     Patient reports she is doing about the same with no major changes. States she picked up duloxetine however, after reading up on it she has concerns about the side effects and decided not to start taking the medication.   Patient is requesting a call from PCP or his team to discuss her concerns and answer her questions.  LCSW also offered patient to schedule F/U Integrated Care appointment.   Plan: LCSW will inform PCP via in-basket message that patient would like a call to discuss her concerns.   Patient request a call after 3:30 if it's in the afternoon or after 9:30 if it's in the morning.  [name], LCSW Licensed Clinical Social Worker [facility]   [phone] 2:25 PM

## 2017-11-28 ENCOUNTER — Other Ambulatory Visit: Payer: Self-pay | Admitting: Family Medicine

## 2017-11-29 ENCOUNTER — Other Ambulatory Visit: Payer: Self-pay | Admitting: Family Medicine

## 2017-11-29 DIAGNOSIS — K3184 Gastroparesis: Secondary | ICD-10-CM

## 2017-11-29 DIAGNOSIS — Z5181 Encounter for therapeutic drug level monitoring: Secondary | ICD-10-CM

## 2017-11-29 DIAGNOSIS — E1143 Type 2 diabetes mellitus with diabetic autonomic (poly)neuropathy: Secondary | ICD-10-CM

## 2017-11-29 DIAGNOSIS — Z79891 Long term (current) use of opiate analgesic: Secondary | ICD-10-CM

## 2017-11-29 DIAGNOSIS — G43A0 Cyclical vomiting, not intractable: Secondary | ICD-10-CM

## 2017-11-29 NOTE — Telephone Encounter (Signed)
Pt needs refill on her oxy. She has an appt 01-05-18.  Please call her when it is ready for pickup

## 2017-11-30 MED ORDER — OXYCODONE HCL 5 MG PO TABS: 5.0000 mg | ORAL_TABLET | ORAL | 0 refills | Status: DC | PRN

## 2017-11-30 NOTE — Telephone Encounter (Signed)
LM for patient that script was sent into the pharmacy. [name],CMA

## 2017-11-30 NOTE — Telephone Encounter (Signed)
Please let [name] [name] know her oxycodone prescription was sent in to the [facility] on [address].

## 2017-12-01 ENCOUNTER — Other Ambulatory Visit: Payer: Self-pay | Admitting: Family Medicine

## 2017-12-01 DIAGNOSIS — K3184 Gastroparesis: Secondary | ICD-10-CM

## 2017-12-01 DIAGNOSIS — E118 Type 2 diabetes mellitus with unspecified complications: Secondary | ICD-10-CM

## 2017-12-01 DIAGNOSIS — Z794 Long term (current) use of insulin: Principal | ICD-10-CM

## 2017-12-01 DIAGNOSIS — E1143 Type 2 diabetes mellitus with diabetic autonomic (poly)neuropathy: Secondary | ICD-10-CM

## 2017-12-01 MED ORDER — BASAGLAR KWIKPEN 100 UNIT/ML ~~LOC~~ SOPN: 12.0000 [IU] | PEN_INJECTOR | Freq: Every day | SUBCUTANEOUS | 5 refills | Status: DC

## 2017-12-01 NOTE — Progress Notes (Signed)
Note fax'd from [facility] on [address] that pts plan does not cover Lantus Solostar.  [facility] call plan to initiate prior authorization or call/fax pharmacy to change medication.    A change to basaglar was already made and sent to pharmacy last month.  Sent basaglar prescription again to [facility] on [address].

## 2017-12-08 ENCOUNTER — Telehealth: Payer: Self-pay | Admitting: Family Medicine

## 2017-12-09 ENCOUNTER — Ambulatory Visit (INDEPENDENT_AMBULATORY_CARE_PROVIDER_SITE_OTHER): Payer: Medicare Other | Admitting: Internal Medicine

## 2017-12-09 ENCOUNTER — Encounter: Payer: Self-pay | Admitting: Internal Medicine

## 2017-12-09 ENCOUNTER — Other Ambulatory Visit: Payer: Self-pay

## 2017-12-09 VITALS — BP 130/79 | HR 77 | Temp 99.0°F | Wt 131.6 lb

## 2017-12-09 DIAGNOSIS — Z79891 Long term (current) use of opiate analgesic: Secondary | ICD-10-CM

## 2017-12-09 DIAGNOSIS — G43A1 Cyclical vomiting, intractable: Secondary | ICD-10-CM | POA: Diagnosis not present

## 2017-12-09 DIAGNOSIS — R1115 Cyclical vomiting syndrome unrelated to migraine: Secondary | ICD-10-CM

## 2017-12-09 DIAGNOSIS — Z5181 Encounter for therapeutic drug level monitoring: Secondary | ICD-10-CM

## 2017-12-09 DIAGNOSIS — G43A0 Cyclical vomiting, not intractable: Secondary | ICD-10-CM

## 2017-12-09 MED ORDER — LORAZEPAM 1 MG PO TABS: 1.0000 mg | ORAL_TABLET | Freq: Four times a day (QID) | ORAL | 2 refills | Status: DC | PRN

## 2017-12-09 MED ORDER — GI COCKTAIL ~~LOC~~
30.0000 mL | Freq: Once | ORAL | Status: AC
  Administered 2017-12-09: 30 mL via ORAL

## 2017-12-09 MED ORDER — PROMETHAZINE HCL 25 MG RE SUPP: 25.0000 mg | Freq: Four times a day (QID) | RECTAL | 3 refills | Status: DC | PRN

## 2017-12-09 NOTE — Patient Instructions (Addendum)
It was nice meeting you today Ms. Zaucha!  Please continue to take Ativan as needed for vomiting. You can use the Phenergan suppositories if you are unable to keep the Ativan or phenergan tablets down. You can use the Haldol if no other other medications are helping.   Be sure to drink as much fluids as possible, and try to drink the Glucerna shakes given to you today as well.   If you are unable to keep down fluids, or feel that your vomiting is worsening, please call us or go to the emergency room.   If you have any questions or concerns, please feel free to call the clinic.   Be well,  Dr. Avon Gully

## 2017-12-09 NOTE — Assessment & Plan Note (Signed)
Chronic issue. Now improving and is able to tolerate fluids and pills. GI cocktail given in clinic today which patient tolerated well. Patient wishes to avoid admission if possible. Discussed with patient's PCP Dr. McDiarmid. Patient appears generally well, HR NL, BP NL, and is tolerating some PO, so admission not indicated at this time. Mouth is slightly dry, but this is to be expected. Provided patient with Glucerna shakes to try to drink in place of food until appetite returns. Also refilled phenergan suppositories and Ativan as patient with only a few tablets left. Patient has plenty of PO phenergan and Haldol, so these not refilled today. Return precautions discussed.

## 2017-12-09 NOTE — Progress Notes (Signed)
   Subjective:   Patient: [name] [name]: 02-23-1966       MRN: [id number]      HPI  [name] [name] is a 51 y.o. female presenting for same day appt for vomiting.   Vomiting Began four days ago. Prior to that had had stomachache for about two weeks. 5 days ago ate a hamburger which she said did not taste right. The next morning woke up with vomiting and diarrhea. Had multiple episodes of diarrhea throughout that day, however has had no diarrhea since then. Vomiting has persisted though is started to improve. Patient has history of vomiting and thus has phenergan suppositories. Used these for a few days when she could not tolerate PO which was helpful, however now she has run out. Has been able to tolerate liquids for the past two days, and began to take her usual PO Ativan which has been helpful as well. Still does not have an appetite but is forcing herself to drink. Denies fevers but says she did have chills one day. Has been urinating a normal amount. Feels generally weak, but does feel better than she has in the past when admitted for similar symptoms. Has been taking her insulin as prescribed despite not eating, but says she has not had any episode of hypoglycemia.   Smoking status reviewed. Patient is current some day smoker.   Review of Systems See HPI.     Objective:  Physical Exam  Constitutional:  Generally well-appearing in NAD  HENT:  Head: Normocephalic and atraumatic.  Nose: Nose normal.  Slightly dry tongue  Eyes:  Eyes watering after taking GI cocktail so difficult to tell if pallor/dryness present, however patient producing tears  Cardiovascular: Normal rate, regular rhythm and normal heart sounds.  No murmur heard. Pulmonary/Chest: Effort normal and breath sounds normal. No respiratory distress. She has no wheezes.  Abdominal: Soft. Bowel sounds are normal. She exhibits no distension. There is no tenderness.  Skin: Skin is warm and dry.  Psychiatric:  Affect and judgment normal.      Assessment & Plan:  Cyclic vomiting syndrome Chronic issue. Now improving and is able to tolerate fluids and pills. GI cocktail given in clinic today which patient tolerated well. Patient wishes to avoid admission if possible. Discussed with patient's PCP Dr. McDiarmid. Patient appears generally well, HR NL, BP NL, and is tolerating some PO, so admission not indicated at this time. Mouth is slightly dry, but this is to be expected. Provided patient with Glucerna shakes to try to drink in place of food until appetite returns. Also refilled phenergan suppositories and Ativan as patient with only a few tablets left. Patient has plenty of PO phenergan and Haldol, so these not refilled today. Return precautions discussed.  Precepted with Dr. McDiarmid.   Adin Hector, MD, MPH PGY-3 [facility] Pager [phone]

## 2017-12-13 ENCOUNTER — Other Ambulatory Visit: Payer: Self-pay | Admitting: Family Medicine

## 2017-12-13 MED ORDER — PROMETHAZINE HCL 12.5 MG PO TABS: 12.5000 mg | ORAL_TABLET | Freq: Four times a day (QID) | ORAL | 3 refills | Status: DC | PRN

## 2017-12-15 ENCOUNTER — Other Ambulatory Visit: Payer: Self-pay | Admitting: Family Medicine

## 2017-12-15 DIAGNOSIS — E1143 Type 2 diabetes mellitus with diabetic autonomic (poly)neuropathy: Secondary | ICD-10-CM

## 2017-12-15 DIAGNOSIS — K3184 Gastroparesis: Secondary | ICD-10-CM

## 2017-12-15 DIAGNOSIS — Z794 Long term (current) use of insulin: Principal | ICD-10-CM

## 2017-12-15 DIAGNOSIS — E118 Type 2 diabetes mellitus with unspecified complications: Secondary | ICD-10-CM

## 2017-12-15 MED ORDER — BASAGLAR KWIKPEN 100 UNIT/ML ~~LOC~~ SOPN: 12.0000 [IU] | PEN_INJECTOR | Freq: Every day | SUBCUTANEOUS | 5 refills | Status: DC

## 2017-12-15 NOTE — Progress Notes (Signed)
2nd time I have sent Rx for basaglar instead of Lantus to [facility] [address]

## 2017-12-30 ENCOUNTER — Other Ambulatory Visit: Payer: Self-pay | Admitting: Family Medicine

## 2017-12-30 DIAGNOSIS — E1143 Type 2 diabetes mellitus with diabetic autonomic (poly)neuropathy: Secondary | ICD-10-CM

## 2017-12-30 DIAGNOSIS — Z79891 Long term (current) use of opiate analgesic: Secondary | ICD-10-CM

## 2017-12-30 DIAGNOSIS — K3184 Gastroparesis: Secondary | ICD-10-CM

## 2017-12-30 DIAGNOSIS — Z5181 Encounter for therapeutic drug level monitoring: Secondary | ICD-10-CM

## 2017-12-30 DIAGNOSIS — G43A0 Cyclical vomiting, not intractable: Secondary | ICD-10-CM

## 2017-12-30 NOTE — Telephone Encounter (Signed)
Pt needs refill on oxycodone.  °

## 2018-01-02 MED ORDER — OXYCODONE HCL 5 MG PO TABS: 5.0000 mg | ORAL_TABLET | ORAL | 0 refills | Status: DC | PRN

## 2018-01-05 ENCOUNTER — Encounter: Payer: Self-pay | Admitting: Family Medicine

## 2018-01-05 ENCOUNTER — Other Ambulatory Visit: Payer: Self-pay

## 2018-01-05 ENCOUNTER — Ambulatory Visit (INDEPENDENT_AMBULATORY_CARE_PROVIDER_SITE_OTHER): Payer: Medicare Other | Admitting: Family Medicine

## 2018-01-05 VITALS — BP 148/78 | HR 73 | Temp 98.1°F | Ht 61.0 in | Wt 140.0 lb

## 2018-01-05 DIAGNOSIS — Z794 Long term (current) use of insulin: Secondary | ICD-10-CM | POA: Diagnosis not present

## 2018-01-05 DIAGNOSIS — F330 Major depressive disorder, recurrent, mild: Secondary | ICD-10-CM

## 2018-01-05 DIAGNOSIS — K3184 Gastroparesis: Secondary | ICD-10-CM | POA: Diagnosis not present

## 2018-01-05 DIAGNOSIS — G43A0 Cyclical vomiting, not intractable: Secondary | ICD-10-CM | POA: Diagnosis present

## 2018-01-05 DIAGNOSIS — N184 Chronic kidney disease, stage 4 (severe): Secondary | ICD-10-CM | POA: Diagnosis not present

## 2018-01-05 DIAGNOSIS — E118 Type 2 diabetes mellitus with unspecified complications: Secondary | ICD-10-CM | POA: Diagnosis not present

## 2018-01-05 DIAGNOSIS — Z79891 Long term (current) use of opiate analgesic: Secondary | ICD-10-CM | POA: Diagnosis not present

## 2018-01-05 DIAGNOSIS — Z5181 Encounter for therapeutic drug level monitoring: Secondary | ICD-10-CM

## 2018-01-05 DIAGNOSIS — E1143 Type 2 diabetes mellitus with diabetic autonomic (poly)neuropathy: Secondary | ICD-10-CM

## 2018-01-05 MED ORDER — PROMETHAZINE HCL 25 MG RE SUPP: 25.0000 mg | Freq: Four times a day (QID) | RECTAL | 99 refills | Status: DC | PRN

## 2018-01-05 MED ORDER — OXYCODONE HCL 5 MG PO TABS: 5.0000 mg | ORAL_TABLET | ORAL | 0 refills | Status: DC | PRN

## 2018-01-05 NOTE — Assessment & Plan Note (Signed)
See cyclic vomiting. Pt is not to receive more than #30 tablets of oxycodone 5 mg per month.  Rx for oxycodone #30 now, in 30 days and in 60 days sent into her pharmacy.   She may pick them up or not but there is no plan to increase the number of tablets.  While there is no evidence of doctor shopping on today's review of [place] CSRS, will check urine opiate if she reports recent use of oxycodone at next visit.

## 2018-01-05 NOTE — Assessment & Plan Note (Signed)
Established problem that has improved.  Lower PHQ9 score without taking prescribed duloxetine bc of her fear of it causing liver damage. Expressing strong faith and prayer life helping with her mood.

## 2018-01-05 NOTE — Assessment & Plan Note (Signed)
Established problem. Stable. Continue current therapy. Encouraged patient to start the Basaglar at 12 units [place] daily as her insurance will not cover Lantus.

## 2018-01-05 NOTE — Patient Instructions (Signed)
Dr Manie Bealer will call you if your tests are not good. Otherwise he will send you a letter.  If you sign up for MyChart online, you will be able to see your test results once Dr Larie Mathes has reviewed them.  If you do not hear from [place] with in 2 weeks please call our office  Pick up your medicines for your stomach before you go home.

## 2018-01-05 NOTE — Progress Notes (Signed)
Subjective:    Patient ID: [name] [name], female    DOB: Jun 28, 1966, 52 y.o.   MRN: [id number] [name] [name] is alone Sources of clinical information for visit is/are patient and past medical records. Nursing assessment for this office visit was reviewed with the patient for accuracy and revision.  Previous Report(s) Reviewed: historical medical records ov   Depression screen [facility] 2/9 01/05/2018  Decreased Interest 0  Down, Depressed, Hopeless 0  PHQ - 2 Score 0  Altered sleeping 2  Tired, decreased energy 1  Change in appetite 2  Feeling bad or failure about yourself  0  Trouble concentrating 0  Moving slowly or fidgety/restless 0  Suicidal thoughts 0  PHQ-9 Score 5  Difficult doing work/chores Somewhat difficult  Some recent data might be hidden   Fall Risk  01/05/2018 12/09/2017 09/28/2017 08/06/2016 10/15/2015  Falls in the past year? No No No No No  Number falls in past yr: - - - - -  Risk for fall due to : - - - (No Data) -  Risk for fall due to: Comment - - - No Fall Rsisks identified today -    HPI Cyclic Vomiting - longstanding issue - Recent exacerbation visit with Dr Natale Milch on 12/14 that resolved on its own.  Patient believes that because her oxycodone is written for prn every six hours that she should have the amount dispensed to allow her to take oxycodone every 6 hours.  She did not get presciption of oxycodone olast sent in for 30 tabs bc she thought there was no point in getting just a 5 day supply.  30 tablets every month is what she has been getting for a long time.   - Has prn Ativan 1 mg to help terminate attacks - Today she woke up with "knot" in her stomach and nausea.  She has not taken any medication for this feeling.   - No ED visits since last ov - Her car has a bent axle which makes her afraid to drive it much.  She did drive down from [place] today for tis visit.  - Has used oxycodone when back pain prodrome to exacerbations occur with good  success.   - Review of [place] CSRS today found no evidence of docotr shopping.   DMT2 - Home CBGs in low mid-100s -two hypoglyemic feeling episodes that she self resolved with coca-cola.  episodes - taking Lantus 12 units each morning but last week has had to cut dose in have because of running out  Of lantus early. She has not picked up Basaglar bc she is afraid it will not work like her Lantus.  She had some left over Lantus pens she has been using.  - no chest pain, no claudication, no limb weakness nor numbness  CHRONIC HYPERTENSION Disease Monitoring             Blood pressure range: not checking at home             Chest pain: no              Dyspnea: no              Claudication: no   Medication compliance: yes, Carvedilol 6.25 mg BID  Medication Side Effects             Lightheadedness: no              Urinary frequency: no  Edema: no    Preventitive Healthcare:             Exercise: no              Diet Pattern: fair             Salt Restriction: no    DEPRESSION Disease Monitoring Current symptoms include loss of appetite, difficulty falling asleep. Symptoms have been gradually improved since last ov with Dr Rohan Juenger     Is not Exercising  Evidence of suicidal ideation: none Pt is no longer concerned about progressive sadness as winter season goes on.  PMH: Prior treatment with Elavil in 1990 but stopped it bc did not like side effects.  See PHQ9 details PHQ-9: 5 score total, somewhat difficult Did not start Cymbalta bc of fear of it casuing "liver damage"  SH: continues to smoke.  Continues to be in  contemplative phase of change.   Review of Systems  Musculoskeletal: Positive for back pain.  No headache No fever No diarrhea No melena   Review of Systems    see HPI Objective:   Physical Exam  VS reviewed GEN: Alert, Cooperative, Groomed, NAD COR: RRR, No M/G/R, No JVD, Normal PMI size and location LUNGS: BCTA, No Acc mm use,  speaking in full sentences ABDOMEN:Tender in epigastric region to moderate palpation, (+)BS, soft, ND, No HSM, No palpable masses, aortic pulse palpable without evidence of enlargement Neuro: Oriented to person, place, and time Gait: Normal speed, No significant path deviation, Step through + Psych: Normal affect/thought/speech/language    Assessment & Plan:  See problem list

## 2018-01-05 NOTE — Assessment & Plan Note (Signed)
Established problem Checking SC[facility] Dr Joelyn Oms (Nephrol) saw 07/2017.  Wanted to see patient again in 6 months.  Will try to reschedule her one for March with less winter weather possible

## 2018-01-05 NOTE — Assessment & Plan Note (Addendum)
Established problem worsened.  Recommend getting started on her Ativan with oxycodone, rest, rectal phenergan sent to pharmacy as she is unable to keep tabs down usually once an attack begins.  Weight has returned to baseline since last attck in mid-December.  Pt has Haldol available as last resort antiemetic before going to ED.   Pt has not picked up her prescribed oxycodone bc she says it gives her only 5 days of therapy since Rx sig reads one tab every 6 hrs prn.  The medications is to be used only to help abort a cyclic vomiting attack, not to be used continuously.  She has been only prescribed #30 tabs of oxycodone per month, so I am uncertain why she now believes she is to receive more per month.   There is no planned increase in the number of oxycodone per month (#30 tablets/month) to be dispensed should she call for a oxycodone prescription with higher number of tablets.

## 2018-01-06 LAB — RENAL FUNCTION PANEL
Albumin: 4.8 g/dL (ref 3.5–5.5)
BUN/Creatinine Ratio: 9 (ref 9–23)
BUN: 16 mg/dL (ref 6–24)
CO2: 22 mmol/L (ref 20–29)
CREATININE: 1.85 mg/dL — AB (ref 0.57–1.00)
Calcium: 10 mg/dL (ref 8.7–10.2)
Chloride: 105 mmol/L (ref 96–106)
GFR, EST AFRICAN AMERICAN: 36 mL/min/{1.73_m2} — AB (ref >59)
GFR, EST NON AFRICAN AMERICAN: 31 mL/min/{1.73_m2} — AB (ref >59)
Glucose: 147 mg/dL — ABNORMAL HIGH (ref 65–99)
PHOSPHORUS: 3.5 mg/dL (ref 2.5–4.5)
Potassium: 5.2 mmol/L (ref 3.5–5.2)
SODIUM: 143 mmol/L (ref 134–144)

## 2018-01-25 ENCOUNTER — Telehealth: Payer: Self-pay | Admitting: Family Medicine

## 2018-01-25 MED ORDER — BENZONATATE 100 MG PO CAPS: 100.0000 mg | ORAL_CAPSULE | Freq: Two times a day (BID) | ORAL | 0 refills | Status: DC | PRN

## 2018-01-25 NOTE — Telephone Encounter (Signed)
Pt is coming down with a cold.  Head congestion, chest is heavy, coughing, body aches.  She has kidney issues and cant take over the counter meds. Please call something in to [facility] on [address].

## 2018-02-06 ENCOUNTER — Other Ambulatory Visit: Payer: Self-pay | Admitting: *Deleted

## 2018-02-06 DIAGNOSIS — Z79891 Long term (current) use of opiate analgesic: Secondary | ICD-10-CM

## 2018-02-06 DIAGNOSIS — Z5181 Encounter for therapeutic drug level monitoring: Secondary | ICD-10-CM

## 2018-02-06 DIAGNOSIS — G43A0 Cyclical vomiting, not intractable: Secondary | ICD-10-CM

## 2018-02-07 MED ORDER — LORAZEPAM 1 MG PO TABS: 1.0000 mg | ORAL_TABLET | Freq: Four times a day (QID) | ORAL | 0 refills | Status: DC | PRN

## 2018-02-09 ENCOUNTER — Encounter: Payer: Self-pay | Admitting: Family Medicine

## 2018-02-09 ENCOUNTER — Ambulatory Visit (INDEPENDENT_AMBULATORY_CARE_PROVIDER_SITE_OTHER): Payer: Medicare Other | Admitting: Family Medicine

## 2018-02-09 ENCOUNTER — Other Ambulatory Visit: Payer: Self-pay

## 2018-02-09 VITALS — BP 140/80 | HR 89 | Temp 98.2°F | Wt 140.0 lb

## 2018-02-09 DIAGNOSIS — K3184 Gastroparesis: Secondary | ICD-10-CM | POA: Diagnosis not present

## 2018-02-09 DIAGNOSIS — N184 Chronic kidney disease, stage 4 (severe): Secondary | ICD-10-CM | POA: Diagnosis not present

## 2018-02-09 DIAGNOSIS — Z79891 Long term (current) use of opiate analgesic: Secondary | ICD-10-CM

## 2018-02-09 DIAGNOSIS — G43A0 Cyclical vomiting, not intractable: Secondary | ICD-10-CM

## 2018-02-09 DIAGNOSIS — E1143 Type 2 diabetes mellitus with diabetic autonomic (poly)neuropathy: Secondary | ICD-10-CM

## 2018-02-09 DIAGNOSIS — Z0000 Encounter for general adult medical examination without abnormal findings: Secondary | ICD-10-CM

## 2018-02-09 DIAGNOSIS — Z5181 Encounter for therapeutic drug level monitoring: Secondary | ICD-10-CM | POA: Diagnosis not present

## 2018-02-09 MED ORDER — OXYCODONE HCL 5 MG PO TABS: 5.0000 mg | ORAL_TABLET | ORAL | 0 refills | Status: DC | PRN

## 2018-02-09 NOTE — Patient Instructions (Signed)
Get started on your Ativan and oxycodone regiment to see if you can shut down the gastroparesis attack.  We drew kidney tests today.. We will work on getting your follow up appointment with Dr Baird Cancer from Nephrology.  Your medications were sent to [facility] on [address].   Your medications were sent to [facility]

## 2018-02-10 ENCOUNTER — Encounter: Payer: Self-pay | Admitting: Family Medicine

## 2018-02-10 LAB — RENAL FUNCTION PANEL
ALBUMIN: 4.8 g/dL (ref 3.5–5.5)
BUN / CREAT RATIO: 13 (ref 9–23)
BUN: 27 mg/dL — AB (ref 6–24)
CALCIUM: 10.1 mg/dL (ref 8.7–10.2)
CHLORIDE: 105 mmol/L (ref 96–106)
CO2: 22 mmol/L (ref 20–29)
Creatinine, Ser: 2.04 mg/dL — ABNORMAL HIGH (ref 0.57–1.00)
GFR calc Af Amer: 31 mL/min/{1.73_m2} — ABNORMAL LOW (ref >59)
GFR calc non Af Amer: 27 mL/min/{1.73_m2} — ABNORMAL LOW (ref >59)
Glucose: 107 mg/dL — ABNORMAL HIGH (ref 65–99)
Phosphorus: 3.6 mg/dL (ref 2.5–4.5)
Potassium: 4.9 mmol/L (ref 3.5–5.2)
Sodium: 141 mmol/L (ref 134–144)

## 2018-02-10 NOTE — Progress Notes (Signed)
Subjective:    Patient ID: [name] [name], female    DOB: 01-28-1966, 52 y.o.   MRN: [id number] [name] [name] is alone Sources of clinical information for visit is/are patient and past medical records. Nursing assessment for this office visit was reviewed with the patient for accuracy and revision.  Previous Report(s) Reviewed: historical medical records and [place] CSRS   Fall Risk  01/05/2018 12/09/2017 09/28/2017 08/06/2016 10/15/2015  Falls in the past year? No No No No No  Number falls in past yr: - - - - -  Risk for fall due to : - - - (No Data) -  Risk for fall due to: Comment - - - No Fall Rsisks identified today -     HPI Cyclic Vomiting - longstanding issue - worsening epigastric pain and nausea with limitation in ability to eat.  Has been sipping fluids.  Ran out of her oxycodone and ativan abortive therapy   She feels a "knot" in her stomach and nausea.  She has not taken any medication for this feeling.   - No ED visits since last ov   - Review of [place] CSRS today with only Dr Iva Posten prescribing her controlled substances  DMT2 - Home CBGs in low mid-100s -two hypoglyemic feeling episodes that she self resolved with coca-cola.  episodes - taking Lantus 12 units each morning but last week has had to cut dose in have because of running out  Of lantus early. She has not picked up Basaglar bc she is afraid it will not work like her Lantus.  She had some left over Lantus pens she has been using.  - no chest pain, no claudication, no limb weakness nor numbness  CHRONIC HYPERTENSION Disease Monitoring             Blood pressure range: not checking at home             Chest pain: no              Dyspnea: no              Claudication: no   Medication compliance: yes, Carvedilol 6.25 mg BID  Medication Side Effects             Lightheadedness: no              Urinary frequency: no              Edema: no    Preventitive Healthcare:             Exercise: no      Diet Pattern: fair             Salt Restriction: no    SH: smoking marijuana two times a day.  No desire to quit.  Sees as helpful for nausea Review of Systems    see HPI Objective:   Physical Exam  VS reviewed GEN: Alert, Cooperative, Groomed, NAD COR: RRR, No M/G/R, No JVD, Normal PMI size and location LUNGS: BCTA, No Acc mm use, speaking in full sentences ABDOMEN:Tender in epigastric region to moderate palpation, (+)BS, soft, ND, No HSM, No palpable masses, aortic pulse palpable without evidence of enlargement Neuro: Oriented to person, place, and time Gait: Normal speed, No significant path deviation, Step through + Psych: Normal affect/thought/speech/language    Assessment & Plan:  See problem list

## 2018-02-10 NOTE — Assessment & Plan Note (Signed)
Advised Colonoscopy Advised ophthalmology consultation for diabetic eye exam  Patient says she wants to wait.  Will discuss again next visit.

## 2018-02-10 NOTE — Assessment & Plan Note (Signed)
Established problem Uncontrolled Restart her abortive therapy of oxycodone and lorazepam.  Glucerna given to sip

## 2018-02-10 NOTE — Assessment & Plan Note (Signed)
Established problem Lab Results  Component Value Date   CREATININE 2.04 (H) 02/09/2018   BUN 27 (H) 02/09/2018   NA 141 02/09/2018   K 4.9 02/09/2018   CL 105 02/09/2018   CO2 22 02/09/2018  Rise in SCr within lab variation Patient was to have appointment follow up with Dr Joelyn Oms with [facility] 6 month after 08/18 visit Will refer pt for F/U with Nephrology

## 2018-02-10 NOTE — Assessment & Plan Note (Signed)
Lab Results  Component Value Date   HGBA1C 6.6 10/27/2017   Report of CBGs in 100s.  No hypoglycemia Recheck A1c next ov in 6 weeks Continue basaglar 12 units qam [name] [name]

## 2018-03-30 ENCOUNTER — Other Ambulatory Visit: Payer: Self-pay | Admitting: Family Medicine

## 2018-03-30 ENCOUNTER — Encounter: Payer: Self-pay | Admitting: Family Medicine

## 2018-03-30 ENCOUNTER — Ambulatory Visit (INDEPENDENT_AMBULATORY_CARE_PROVIDER_SITE_OTHER): Payer: Medicare Other | Admitting: Family Medicine

## 2018-03-30 ENCOUNTER — Other Ambulatory Visit: Payer: Self-pay

## 2018-03-30 VITALS — BP 140/78 | HR 57 | Temp 98.2°F | Ht 61.0 in | Wt 147.0 lb

## 2018-03-30 DIAGNOSIS — E7800 Pure hypercholesterolemia, unspecified: Secondary | ICD-10-CM | POA: Diagnosis not present

## 2018-03-30 DIAGNOSIS — G8929 Other chronic pain: Secondary | ICD-10-CM | POA: Diagnosis not present

## 2018-03-30 DIAGNOSIS — R8290 Unspecified abnormal findings in urine: Secondary | ICD-10-CM

## 2018-03-30 DIAGNOSIS — E1142 Type 2 diabetes mellitus with diabetic polyneuropathy: Secondary | ICD-10-CM

## 2018-03-30 DIAGNOSIS — Z79891 Long term (current) use of opiate analgesic: Secondary | ICD-10-CM | POA: Diagnosis not present

## 2018-03-30 DIAGNOSIS — M25511 Pain in right shoulder: Secondary | ICD-10-CM

## 2018-03-30 DIAGNOSIS — G43A0 Cyclical vomiting, not intractable: Secondary | ICD-10-CM

## 2018-03-30 DIAGNOSIS — M7501 Adhesive capsulitis of right shoulder: Secondary | ICD-10-CM

## 2018-03-30 DIAGNOSIS — M25551 Pain in right hip: Secondary | ICD-10-CM

## 2018-03-30 DIAGNOSIS — Z5181 Encounter for therapeutic drug level monitoring: Secondary | ICD-10-CM | POA: Diagnosis not present

## 2018-03-30 DIAGNOSIS — M7062 Trochanteric bursitis, left hip: Secondary | ICD-10-CM | POA: Diagnosis not present

## 2018-03-30 DIAGNOSIS — Z794 Long term (current) use of insulin: Secondary | ICD-10-CM

## 2018-03-30 DIAGNOSIS — I10 Essential (primary) hypertension: Secondary | ICD-10-CM | POA: Diagnosis not present

## 2018-03-30 DIAGNOSIS — E118 Type 2 diabetes mellitus with unspecified complications: Secondary | ICD-10-CM

## 2018-03-30 DIAGNOSIS — Z0000 Encounter for general adult medical examination without abnormal findings: Secondary | ICD-10-CM

## 2018-03-30 LAB — POCT URINALYSIS DIP (MANUAL ENTRY)
BILIRUBIN UA: NEGATIVE
BILIRUBIN UA: NEGATIVE mg/dL
GLUCOSE UA: NEGATIVE mg/dL
Leukocytes, UA: NEGATIVE
Nitrite, UA: NEGATIVE
PH UA: 6 (ref 5.0–8.0)
Protein Ur, POC: 30 mg/dL — AB
SPEC GRAV UA: 1.01 (ref 1.010–1.025)
Urobilinogen, UA: 0.2 E.U./dL

## 2018-03-30 LAB — POCT UA - MICROSCOPIC ONLY

## 2018-03-30 LAB — POCT GLYCOSYLATED HEMOGLOBIN (HGB A1C): HEMOGLOBIN A1C: 7.5

## 2018-03-30 MED ORDER — OXYCODONE HCL 5 MG PO TABS: 5.0000 mg | ORAL_TABLET | ORAL | 0 refills | Status: DC | PRN

## 2018-03-30 MED ORDER — LORAZEPAM 1 MG PO TABS: 1.0000 mg | ORAL_TABLET | Freq: Four times a day (QID) | ORAL | 5 refills | Status: DC | PRN

## 2018-03-30 MED ORDER — GABAPENTIN 100 MG PO CAPS: 100.0000 mg | ORAL_CAPSULE | Freq: Three times a day (TID) | ORAL | 3 refills | Status: DC

## 2018-03-30 NOTE — Patient Instructions (Addendum)
We will set up an appointment with Dr Dorcas Mcmurray to evaluate your right shoulder and left hip problems.  INcrease your Basaglar to 18 units a day.   Start a new medication called Gabapentin for your peripheral neuropathy discomfort in the soles of your feet.   Refills of your oxycodone for 3 months was sent to your pharmacy.  Refills of your Ativan were sent to your pharmacy.

## 2018-03-31 ENCOUNTER — Encounter: Payer: Self-pay | Admitting: Family Medicine

## 2018-03-31 DIAGNOSIS — M25551 Pain in right hip: Secondary | ICD-10-CM | POA: Insufficient documentation

## 2018-03-31 DIAGNOSIS — M25552 Pain in left hip: Secondary | ICD-10-CM

## 2018-03-31 DIAGNOSIS — M7581 Other shoulder lesions, right shoulder: Secondary | ICD-10-CM

## 2018-03-31 DIAGNOSIS — M25511 Pain in right shoulder: Secondary | ICD-10-CM

## 2018-03-31 DIAGNOSIS — G8929 Other chronic pain: Secondary | ICD-10-CM

## 2018-03-31 HISTORY — DX: Pain in right hip: M25.551

## 2018-03-31 HISTORY — DX: Other shoulder lesions, right shoulder: M75.81

## 2018-03-31 HISTORY — DX: Pain in left hip: M25.552

## 2018-03-31 NOTE — Assessment & Plan Note (Signed)
Established problem. Stable. Continue abortive therapy as needed with Ativan and oxycodone.  Phenergan available PR/PO.  Back up Haldol available as trial before going to ED.

## 2018-03-31 NOTE — Assessment & Plan Note (Signed)
Patient's colonoscopy was canceled by GI office bc incliment weather.  She has not resceduled bc she is unable to find someone to accompany her to the colonoscopy.  Will give FIT next ov

## 2018-03-31 NOTE — Assessment & Plan Note (Signed)
Established problem worsened.  Suspect adhesive capsulitis in pt with diabetes.  Referral to Dr Dorcas Mcmurray at [facility] for consideration of inter-glenohumeral joint injection tx.

## 2018-03-31 NOTE — Assessment & Plan Note (Signed)
New problem Suspect greater trochanteric bursitis. Pt will be seeing Dr Dorcas Mcmurray in [facility] consult soon.  Would ask if they would consider diagnosis and whether injecting under [place] would be helpful to pt./

## 2018-03-31 NOTE — Progress Notes (Signed)
Subjective:    Patient ID: [name] [name], female    DOB: 1966-11-10, 52 y.o.   MRN: [id number] [name] [name] is alone Sources of clinical information for visit is/are patient and past medical records. Nursing assessment for this office visit was reviewed with the patient for accuracy and revision.  Previous Report(s) Reviewed: historical medical records  Depression screen [facility] 2/9 03/30/2018  Decreased Interest 1  Down, Depressed, Hopeless 0  PHQ - 2 Score 1  Altered sleeping -  Tired, decreased energy -  Change in appetite -  Feeling bad or failure about yourself  -  Trouble concentrating -  Moving slowly or fidgety/restless -  Suicidal thoughts -  PHQ-9 Score -  Difficult doing work/chores -  Some recent data might be hidden   Fall Risk  01/05/2018 12/09/2017 09/28/2017 08/06/2016 10/15/2015  Falls in the past year? No No No No No  Number falls in past yr: - - - - -  Risk for fall due to : - - - (No Data) -  Risk for fall due to: Comment - - - No Fall Rsisks identified today -    HPI  Problem List Items Addressed This Visit      High   Cyclic vomiting syndrome - longstanding - Has used Ativan and oxycodone to abort attacks successfully since last visit - No attack severe enough to send her to ED. - No constipation with Amitiza.  No diarrhea. No confusion, no falls, no accidents -    Relevant Medications   LORazepam (ATIVAN) 1 MG tablet   oxyCODONE (OXY IR/ROXICODONE) 5 MG immediate release tablet   oxyCODONE (ROXICODONE) 5 MG immediate release tablet   oxyCODONE (ROXICODONE) 5 MG immediate release tablet   DM neuropathies (HCC) - Longstasdning, buring itching feeling on balls of both feet. - trial of Lyrica resulted in lower leg cramping.  Pt chose not to take duloxetine because of possible side effects - Interferes with sleep.  - No paresthesia, no erythema or edema of feet   Relevant Medications   gabapentin (NEURONTIN) 100 MG capsule   Insulin Glargine  (BASAGLAR KWIKPEN) 100 UNIT/ML SOPN   DM (diabetes mellitus), type 2 with complications ([place]) - Primary - CHRONIC DIABETES  Disease Monitoring  Blood Sugar Ranges: 180 to 240 fasting in lst week  Polyuria: yes   Visual problems: no   Medication Compliance: yes, Bsaglar 12 units daily  Medication Side Effects  Hypoglycemia: no   Preventitive Health Care             Daily Aspirin: yes              Statin: no                   ACEI: No   Eye Exam: No  Foot Exam:yes  Diet pattern:high in fats and simple CH2O  Exercise: no SH: 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     Relevant Medications   Insulin Glargine (BASAGLAR KWIKPEN) 100 UNIT/ML SOPN   Other Relevant Orders   HgB A1c (Completed)   POCT UA - Microscopic Only (Completed)   Ambulatory referral to Sports Medicine   Diabetic gastroparesis associated with type 2 diabetes mellitus (HCC)   Relevant Medications   Insulin Glargine (BASAGLAR KWIKPEN) 100 UNIT/ML SOPN   oxyCODONE (OXY IR/ROXICODONE) 5 MG immediate release tablet   oxyCODONE (ROXICODONE) 5 MG immediate release tablet  oxyCODONE (ROXICODONE) 5 MG immediate release tablet     Medium   Encounter for monitoring opioid maintenance therapy   Relevant Medications   LORazepam (ATIVAN) 1 MG tablet   oxyCODONE (OXY IR/ROXICODONE) 5 MG immediate release tablet   oxyCODONE (ROXICODONE) 5 MG immediate release tablet   oxyCODONE (ROXICODONE) 5 MG immediate release tablet   Essential hypertension    Other Visit Diagnoses    Cloudy urine  Urinalysis    Component Value Date/Time   COLORURINE STRAW (A) 06/14/2017 0555   APPEARANCEUR CLEAR 06/14/2017 0555   LABSPEC 1.007 06/14/2017 0555   PHURINE 7.0 06/14/2017 0555   GLUCOSEU >=500 (A) 06/14/2017 0555   HGBUR SMALL (A) 06/14/2017 0555   HGBUR small 10/19/2010 0852   BILIRUBINUR negative 03/30/2018 1005   BILIRUBINUR NEG 10/01/2016 0906   KETONESUR negative 03/30/2018  1005   KETONESUR NEGATIVE 06/14/2017 0555   PROTEINUR =30 (A) 03/30/2018 1005   PROTEINUR 30 (A) 06/14/2017 0555   UROBILINOGEN 0.2 03/30/2018 1005   UROBILINOGEN 0.2 09/26/2015 1031   NITRITE Negative 03/30/2018 1005   NITRITE NEGATIVE 06/14/2017 0555   LEUKOCYTESUR Negative 03/30/2018 1005      No Dysuria, urgency, frequency, suprapubic pain, no flank pain   Relevant Orders   POCT urinalysis dipstick (Completed)   POCT UA - Microscopic Only (Completed)   Chronic pain and limitation of motion of  right shoulder  - Longstanding issue for several years.   - Unable to use right shoulder above her head. Painful if tries to raise right shoulder above head. - Painful if reaches with right arm to inject insulin into left posterior arm.  - Subacromial steroid injections have not been helpful.  Deep pain and clicking sensation in right shoulder - Has seen Dr [name] [name] in past and would like to see her again.       Relevant Medications   oxyCODONE (OXY IR/ROXICODONE) 5 MG immediate release tablet   oxyCODONE (ROXICODONE) 5 MG immediate release tablet   oxyCODONE (ROXICODONE) 5 MG immediate release tablet   Other Relevant Orders   Ambulatory referral to Sports Medicine   Pain of left hip  - Present for about one month - Not taking NSAIDs bc CKD - aching pain.  Interferes with prolonged standing - radiates from lat hip towards knee.   - No weakness in leg.       Relevant Orders   Ambulatory referral to Sports Medicine   SH: Smoking status reviewed.  Counseling offered and declined.    Review of Systems See hpi    Objective:   Physical Exam VS reviewed GEN: Alert, Cooperative, Groomed, NAD HEENT: PERRL; EAC bilaterally not occluded, TM's translucent with normal LM, (+) LR;                No cervical LAN, No thyromegaly, No palpable masses COR: RRR, No M/G/R, No JVD, Normal PMI size and location LUNGS: BCTA, No Acc mm use, speaking in full sentences ABDOMEN: (+)BS, soft, NT, ND,  No HSM, No palpable masses GU: Normal Rectal tone, no palpable masses, prostate without hypertrophy/asymmetry/nodularity. Hemoccult negative. EXT:  Right Shoulder: Inspection reveals no abnormalities, atrophy or asymmetry. Palpation is normal with no tenderness over AC joint or bicipital groove. Active and passiveAbduction limited to 90 - 100 degrees. Pain with cross adduction.   Left Hip  ROM IR: 80 Deg, ER: 80 Deg, Strength Flexion: 5/5, Extension: 5/5, Abduction: 5/5, Adduction: 5/5 Greater trochanter without tenderness to palpation.  No peripheral leg edema.  Feet without deformity or lesions. Palpable bilateral pedal pulses. Calluses over 5th and 1st MT heads bilaterally. See Diabetes Foot Exam in screening tab  Gait: Normal speed, No significant path deviation, Step through +,  Psych: Normal affect/thought/speech/language  SH: smoking status reviewed      Assessment & Plan:  Visit Problem List with A/P  No problem-specific Assessment & Plan notes found for this encounter.

## 2018-03-31 NOTE — Assessment & Plan Note (Signed)
Established problem worsened.  Itching and buring sensation of balls of feet worsening, pt rubbing constantly, interfering with falling asleep. Did not tolerate Lyrica bc leg cramping.  Did not take duloxetine bc fear of possible ADE.  Pt will to try gabapentin.  Start 100 mg TID and titrate if tolerated.

## 2018-03-31 NOTE — Assessment & Plan Note (Addendum)
Established problem worsened.  Lab Results  Component Value Date   HGBA1C 7.5 03/30/2018  Up from 6.6 last A1c. Still with good control but given her age, would prefer it less than 7.0% Suspect inactivity and poor diet adherence given weight gain is source of decline in glycemic control  Pt reports good control in past with with 24 units Lantus. She is reluctant to start prandial insulin. Dulaglutide would be an option except fo common ADE of GI upset. A DPP-4 inhibitor or SGLT-2 may be better tolerated.   Plan: Increase Basaglar to 18 units daily.

## 2018-04-07 ENCOUNTER — Ambulatory Visit (INDEPENDENT_AMBULATORY_CARE_PROVIDER_SITE_OTHER): Payer: Medicare Other | Admitting: Family Medicine

## 2018-04-07 ENCOUNTER — Encounter: Payer: Self-pay | Admitting: Family Medicine

## 2018-04-07 DIAGNOSIS — M25511 Pain in right shoulder: Secondary | ICD-10-CM | POA: Diagnosis not present

## 2018-04-07 DIAGNOSIS — R809 Proteinuria, unspecified: Secondary | ICD-10-CM | POA: Diagnosis not present

## 2018-04-07 DIAGNOSIS — R300 Dysuria: Secondary | ICD-10-CM | POA: Diagnosis not present

## 2018-04-07 DIAGNOSIS — G8929 Other chronic pain: Secondary | ICD-10-CM | POA: Diagnosis not present

## 2018-04-07 DIAGNOSIS — I129 Hypertensive chronic kidney disease with stage 1 through stage 4 chronic kidney disease, or unspecified chronic kidney disease: Secondary | ICD-10-CM | POA: Diagnosis not present

## 2018-04-07 DIAGNOSIS — N183 Chronic kidney disease, stage 3 (moderate): Secondary | ICD-10-CM | POA: Diagnosis not present

## 2018-04-07 DIAGNOSIS — M25552 Pain in left hip: Secondary | ICD-10-CM

## 2018-04-07 DIAGNOSIS — M25551 Pain in right hip: Secondary | ICD-10-CM

## 2018-04-07 DIAGNOSIS — N2581 Secondary hyperparathyroidism of renal origin: Secondary | ICD-10-CM | POA: Diagnosis not present

## 2018-04-07 MED ORDER — METHYLPREDNISOLONE ACETATE 40 MG/ML IJ SUSP
40.0000 mg | Freq: Once | INTRAMUSCULAR | Status: AC
  Administered 2018-04-07: 40 mg via INTRA_ARTICULAR

## 2018-04-07 NOTE — Assessment & Plan Note (Signed)
Combination of rotator cuff tendinopathy, acromioclavicular joint degenerative change in right bicep tendinopathy.  We will try subacromial bursa injection today.  Home exercise given and explained.  Follow-up 3-4 weeks.  This is been a chronic problem for her.  If she is having no improvement, would probably need to get updated x-rays/ultrasound RC and acromioclavicular joint.

## 2018-04-07 NOTE — Assessment & Plan Note (Deleted)
Pretty tender greater trochanteric bursa.  We will try CSI today.  Follow-up 3 weeks.  If no improvement, consider ultrasound, hip abductor and abductor strengthening.

## 2018-04-07 NOTE — Progress Notes (Signed)
    CHIEF COMPLAINT / HPI: Right shoulder pain: Had some issues several years ago we gave her an injection which really helped.  She is also had problems in the past with bicep tendinopathy.  Symptoms have returned in the last several months.  Pain with reaching across to the other shoulder give herself injections, pain is in the anterior and posterior shoulder.  Aching in nature, 6 out of 10.  Some pain with overhead reaching.  No numbness or tingling in her right hand.  No notable weakness in her right upper extremity. 2.  Left hip pain: Lateral area of the hip.  Difficult to lie on that side at night.  Pain is diffuse, stairs seem to make it worse.  Standing long periods of time also make it worse.  Nothing seems to make it better.  REVIEW OF SYSTEMS: No unusual weight change, no fever.  See HPI.  PERTINENT  PMH / PSH: I have reviewed the patient's medications, allergies, past medical and surgical history, smoking status and updated in the EMR as appropriate.   OBJECTIVE: GENERAL: Well-developed female no acute distress SHOULDER: Bilateral symmetrical.  Right shoulder reveals some mild tenderness to palpation over the proximal portion of the biceps tendon area.  Biceps tendon is without any sign of defect.  Normal biceps strength.  Range of motion in all planes the rotator cuff is full.  Full strength.  Symmetrical.  Mild tenderness to palpation across the area of the acromioclavicular joint.  She has some pain with resisted supraspinatus testing HIPS: Internal and external rotation full and painless.  Hip flexor strength 5 out of 5.  Mildly tender to palpation in the lateral portion of the left hip over the area of the greater trochanter VASCULAR: Radial pulses and dorsalis pedis pulses 2+ bilaterally symmetrical  PROCEDURE: INJECTION: Patient was given informed consent, signed copy in the chart. Appropriate time out was taken. Area prepped and draped in usual sterile fashion. Ethyl chloride  was  used for local anesthesia. A 21 gauge 1 1/2 inch needle was used.. 1 cc of methylprednisolone 40 mg/ml plus 4 cc of 1% lidocaine without epinephrine was injected into the right subacromial bursa using a(n) posterior PROCEDURE: INJECTION: Patient was given informed consent, signed copy in the chart. Appropriate time out was taken. Area prepped and draped in usual sterile fashion. Ethyl chloride was  used for local anesthesia. A 21 gauge 1 1/2 inch needle was used.. 1 cc of methylprednisolone 40 mg/ml plus 4 cc of 1% lidocaine without epinephrine was injected into the left greater trochanteric bursa of the left hip using a(n) lateral approach.   The patient tolerated the procedure well. There were no complications. Post procedure instructions were given.  approach.   The patient tolerated the procedure well. There were no complications. Post procedure instructions were given.  ASSESSMENT / PLAN: Please see problem oriented charting for details

## 2018-04-07 NOTE — Assessment & Plan Note (Signed)
Left hip: Pretty tender greater trochanteric bursa.  We will try CSI today.  Follow-up 3 weeks.  If no improvement, consider ultrasound, hip abductor and abductor strengthening.

## 2018-04-11 ENCOUNTER — Other Ambulatory Visit: Payer: Self-pay | Admitting: Family Medicine

## 2018-04-11 DIAGNOSIS — I10 Essential (primary) hypertension: Secondary | ICD-10-CM

## 2018-04-11 NOTE — Progress Notes (Unsigned)
BP with Dr Pearson Grippe 220/95 at [place] 04/07/18.  Started on Chlortalidone 25 mg daily and Amlodipine 5 mg daily Continue Carvedilol 6.25 mg BID RTC M, Tu with home bp readings RTC 2 wks for app visit

## 2018-04-25 DIAGNOSIS — R809 Proteinuria, unspecified: Secondary | ICD-10-CM | POA: Diagnosis not present

## 2018-04-25 DIAGNOSIS — N183 Chronic kidney disease, stage 3 (moderate): Secondary | ICD-10-CM | POA: Diagnosis not present

## 2018-04-25 DIAGNOSIS — E1122 Type 2 diabetes mellitus with diabetic chronic kidney disease: Secondary | ICD-10-CM | POA: Diagnosis not present

## 2018-04-25 DIAGNOSIS — F17200 Nicotine dependence, unspecified, uncomplicated: Secondary | ICD-10-CM | POA: Diagnosis not present

## 2018-04-25 DIAGNOSIS — D649 Anemia, unspecified: Secondary | ICD-10-CM | POA: Diagnosis not present

## 2018-04-25 DIAGNOSIS — N2581 Secondary hyperparathyroidism of renal origin: Secondary | ICD-10-CM | POA: Diagnosis not present

## 2018-04-25 DIAGNOSIS — I129 Hypertensive chronic kidney disease with stage 1 through stage 4 chronic kidney disease, or unspecified chronic kidney disease: Secondary | ICD-10-CM | POA: Diagnosis not present

## 2018-04-25 LAB — BASIC METABOLIC PANEL
BUN: 34 — AB (ref 4–21)
CREATININE: 2.2 — AB (ref 0.5–1.1)
Glucose: 311
POTASSIUM: 4.6 (ref 3.4–5.3)
Sodium: 134 — AB (ref 137–147)

## 2018-04-28 ENCOUNTER — Other Ambulatory Visit: Payer: Self-pay | Admitting: Family Medicine

## 2018-04-28 ENCOUNTER — Telehealth: Payer: Self-pay

## 2018-04-28 ENCOUNTER — Ambulatory Visit
Admission: RE | Admit: 2018-04-28 | Discharge: 2018-04-28 | Disposition: A | Discharge status: Home / Self Care | Payer: Medicare Other | Source: Ambulatory Visit | Attending: Family Medicine | Admitting: Family Medicine

## 2018-04-28 ENCOUNTER — Ambulatory Visit (INDEPENDENT_AMBULATORY_CARE_PROVIDER_SITE_OTHER): Payer: Medicare Other | Admitting: Family Medicine

## 2018-04-28 VITALS — BP 116/68 | Ht 61.5 in | Wt 143.0 lb

## 2018-04-28 DIAGNOSIS — M25511 Pain in right shoulder: Principal | ICD-10-CM

## 2018-04-28 DIAGNOSIS — M545 Low back pain, unspecified: Secondary | ICD-10-CM

## 2018-04-28 DIAGNOSIS — Z1231 Encounter for screening mammogram for malignant neoplasm of breast: Secondary | ICD-10-CM

## 2018-04-28 DIAGNOSIS — G8929 Other chronic pain: Secondary | ICD-10-CM | POA: Diagnosis not present

## 2018-04-28 DIAGNOSIS — M7918 Myalgia, other site: Secondary | ICD-10-CM

## 2018-04-28 DIAGNOSIS — R52 Pain, unspecified: Secondary | ICD-10-CM | POA: Diagnosis not present

## 2018-04-28 MED ORDER — DIAZEPAM 5 MG PO TABS: ORAL_TABLET | ORAL | 0 refills | Status: DC

## 2018-04-28 NOTE — Progress Notes (Signed)
    CHIEF COMPLAINT / HPI: Continue left hip/buttock/low back pain.  Injection last time only helped about 10%.  Pain is most significant if she tries to squat. 2.  Continue problems with right shoulder.  She is right-handed and this shoulder pain is really inhibiting her ability to return to work.  She works as a Dentist. REVIEW OF SYSTEMS: Pain as per HPI.  No unusual weight change.  No fever.  No lower extremity numbness.  No incontinence of bowel or bladder.  No rash.  PERTINENT  PMH / PSH: I have reviewed the patient's medications, allergies, past medical and surgical history, smoking status and updated in the EMR as appropriate.   OBJECTIVE: GENERAL: Well-developed female no acute distress hips: Still tender to palpation laterally over the area of the greater trochanter.  She also has some tenderness to compression of the SI joints.  Internal and external rotation is full.  Painless in all planes.  Hip flexor strength 5 out of 5. BACK: Nontender to percussion.  No deformity is noted.  Some stiffness with hyperextension but no pain.  Normal flexion at the hips. GAIT: Normal.  ASSESSMENT / PLAN: Please see problem oriented charting for details

## 2018-04-28 NOTE — Assessment & Plan Note (Signed)
She has not really done very well with any of her conservative therapy so at this point I think we need to get an MRI.  It is really inhibiting her ability to do meaningful work because she is right-hand dominant.  She has some anxiety so I will give her to tablets of 5 mg Valium for the procedure.

## 2018-04-28 NOTE — Assessment & Plan Note (Signed)
Since the corticosteroid injection into the greater trochanteric bursa did very little, I am starting to reconsider my diagnosis.  We will get imaging of the sacroiliac joints as that is the next most likely problem that would present this way.  Also get plain x-rays of the lumbar back.  This could be related to back pathology.  She will follow-up next week.

## 2018-04-28 NOTE — Telephone Encounter (Signed)
Miriam from [place] left message regarding MRI order for shoulder.  Order is entered as MRI right shoulder WITH contrast; she states that they do not typically do just WITH.  Asks for order to be changed to WITH/WITHOUT contrast or just WITHOUT.  Call back for questions is [phone], option 1, then option 3.   Danley Danker, RN [facility] RN)

## 2018-05-01 ENCOUNTER — Ambulatory Visit: Payer: Self-pay

## 2018-05-05 ENCOUNTER — Ambulatory Visit (INDEPENDENT_AMBULATORY_CARE_PROVIDER_SITE_OTHER): Payer: Medicare Other | Admitting: Family Medicine

## 2018-05-05 ENCOUNTER — Encounter: Payer: Self-pay | Admitting: Family Medicine

## 2018-05-05 VITALS — BP 148/70 | Ht 61.5 in | Wt 143.0 lb

## 2018-05-05 DIAGNOSIS — G8929 Other chronic pain: Secondary | ICD-10-CM

## 2018-05-05 DIAGNOSIS — G43A0 Cyclical vomiting, not intractable: Secondary | ICD-10-CM | POA: Diagnosis not present

## 2018-05-05 DIAGNOSIS — M25511 Pain in right shoulder: Secondary | ICD-10-CM

## 2018-05-05 MED ORDER — PROMETHAZINE HCL 25 MG/ML IJ SOLN: 25.0000 mg | Freq: Once | INTRAMUSCULAR | 0 refills | Status: DC

## 2018-05-05 MED ORDER — PROMETHAZINE HCL 25 MG/ML IJ SOLN
25.0000 mg | INTRAMUSCULAR | Status: AC
  Administered 2018-05-05: 25 mg via INTRAVENOUS

## 2018-05-08 ENCOUNTER — Encounter: Payer: Self-pay | Admitting: Family Medicine

## 2018-05-08 NOTE — Progress Notes (Signed)
    CHIEF COMPLAINT / HPI: Originally scheduled to follow-up MRI of her shoulder.  Unfortunately that did not get scheduled as we had planned.  She is having significant nausea today with some mild emesis.  Has history of cyclic vomiting syndrome but has not had problems with this in a long period of time.  Unclear of the trigger.  REVIEW OF SYSTEMS: Denies fever.  No blood in emesis.  Appetite has been normal.  PERTINENT  PMH / PSH: I have reviewed the patient's medications, allergies, past medical and surgical history, smoking status and updated in the EMR as appropriate.   OBJECTIVE:  Vital signs reviewed. GENERAL: Well-developed, well-nourished, no acute distress. CARDIOVASCULAR: Regular rate and rhythm no murmur gallop or rub LUNGS: Clear to auscultation bilaterally, no rales or wheeze. ABDOMEN: Soft positive bowel sounds NEURO: No gross focal neurological deficits. MSK: Movement of extremity x 4.    ASSESSMENT / PLAN: #1.  Shoulder pain: We set up an MRI is scheduled before she left today. #2.  Cyclic vomiting exacerbation.  We gave her injection of Phenergan.  Follow-up with her PCP regarding this.

## 2018-05-08 NOTE — Assessment & Plan Note (Signed)
mri

## 2018-05-19 ENCOUNTER — Ambulatory Visit
Admission: RE | Admit: 2018-05-19 | Discharge: 2018-05-19 | Disposition: A | Discharge status: Home / Self Care | Payer: Medicare Other | Source: Ambulatory Visit | Attending: Family Medicine | Admitting: Family Medicine

## 2018-05-19 DIAGNOSIS — Z1231 Encounter for screening mammogram for malignant neoplasm of breast: Secondary | ICD-10-CM

## 2018-05-23 ENCOUNTER — Ambulatory Visit
Admission: RE | Admit: 2018-05-23 | Discharge: 2018-05-23 | Disposition: A | Discharge status: Home / Self Care | Payer: Medicare Other | Source: Ambulatory Visit | Attending: Family Medicine | Admitting: Family Medicine

## 2018-05-23 DIAGNOSIS — M25511 Pain in right shoulder: Principal | ICD-10-CM

## 2018-05-23 DIAGNOSIS — M75111 Incomplete rotator cuff tear or rupture of right shoulder, not specified as traumatic: Secondary | ICD-10-CM | POA: Diagnosis not present

## 2018-05-23 DIAGNOSIS — G8929 Other chronic pain: Secondary | ICD-10-CM

## 2018-05-26 DIAGNOSIS — N2581 Secondary hyperparathyroidism of renal origin: Secondary | ICD-10-CM | POA: Diagnosis not present

## 2018-05-26 DIAGNOSIS — R809 Proteinuria, unspecified: Secondary | ICD-10-CM | POA: Diagnosis not present

## 2018-05-26 DIAGNOSIS — I129 Hypertensive chronic kidney disease with stage 1 through stage 4 chronic kidney disease, or unspecified chronic kidney disease: Secondary | ICD-10-CM | POA: Diagnosis not present

## 2018-05-26 DIAGNOSIS — D649 Anemia, unspecified: Secondary | ICD-10-CM | POA: Diagnosis not present

## 2018-05-26 DIAGNOSIS — N183 Chronic kidney disease, stage 3 (moderate): Secondary | ICD-10-CM | POA: Diagnosis not present

## 2018-05-29 ENCOUNTER — Other Ambulatory Visit: Payer: Self-pay | Admitting: Family Medicine

## 2018-05-29 DIAGNOSIS — G43A0 Cyclical vomiting, not intractable: Secondary | ICD-10-CM

## 2018-05-29 DIAGNOSIS — Z79891 Long term (current) use of opiate analgesic: Principal | ICD-10-CM

## 2018-05-29 DIAGNOSIS — Z5181 Encounter for therapeutic drug level monitoring: Secondary | ICD-10-CM

## 2018-05-29 NOTE — Telephone Encounter (Signed)
Pt needs refill on her oxycodone. Please advise

## 2018-06-02 MED ORDER — OXYCODONE HCL 5 MG PO TABS: 5.0000 mg | ORAL_TABLET | ORAL | 0 refills | Status: DC | PRN

## 2018-06-08 ENCOUNTER — Encounter: Payer: Self-pay | Admitting: Family Medicine

## 2018-06-08 LAB — PHOSPHORUS
Chloride: 104
GFR calc Af Amer: 29
Phosphorus, Ser: 3.2

## 2018-06-08 LAB — CALCIUM
ALBUMIN: 4.6
CALCIUM: 9.8

## 2018-07-27 ENCOUNTER — Ambulatory Visit: Payer: Medicare Other | Admitting: Family Medicine

## 2018-07-28 ENCOUNTER — Ambulatory Visit (INDEPENDENT_AMBULATORY_CARE_PROVIDER_SITE_OTHER): Payer: Medicare Other | Admitting: Family Medicine

## 2018-07-28 ENCOUNTER — Other Ambulatory Visit: Payer: Self-pay | Admitting: Family Medicine

## 2018-07-28 ENCOUNTER — Other Ambulatory Visit: Payer: Self-pay

## 2018-07-28 VITALS — BP 110/64 | HR 50 | Temp 97.8°F | Ht 61.5 in | Wt 143.4 lb

## 2018-07-28 DIAGNOSIS — G8929 Other chronic pain: Secondary | ICD-10-CM | POA: Diagnosis not present

## 2018-07-28 DIAGNOSIS — M545 Low back pain: Secondary | ICD-10-CM | POA: Diagnosis not present

## 2018-07-28 DIAGNOSIS — F17200 Nicotine dependence, unspecified, uncomplicated: Secondary | ICD-10-CM | POA: Diagnosis not present

## 2018-07-28 MED ORDER — NICOTINE POLACRILEX 4 MG MT GUM: 4.0000 mg | CHEWING_GUM | OROMUCOSAL | 0 refills | Status: DC | PRN

## 2018-07-28 MED ORDER — BUPROPION HCL 75 MG PO TABS: 75.0000 mg | ORAL_TABLET | Freq: Two times a day (BID) | ORAL | 0 refills | Status: DC

## 2018-07-28 NOTE — Patient Instructions (Signed)
It was a pleasure to see you today! Thank you for choosing [facility] for your primary care. [name] [name] was seen for chronic back pain. Come back to the clinic if you have any routine concerns, and go to the emergency room if you have any life threatening symptoms.  Today we ordered some nicotine gum, wellbutrin (to help with cravings) and physical therapy.  If you don't hear from [place] in two weeks, please give [place] a call to verify your results. Otherwise, we look forward to seeing you again at your next visit. If you have any questions or concerns before then, please call the clinic at [phone].   Please bring all your medications to every doctors visit   Sign up for My Chart to have easy access to your labs results, and communication with your Primary care physician.     Please check-out at the front desk before leaving the clinic.     Best,  Dr. Sherene Sires FAMILY MEDICINE RESIDENT - PGY2 07/28/2018 9:26 AM

## 2018-07-28 NOTE — Assessment & Plan Note (Addendum)
Chronic problem, referal to physical therapy w/ instructions to stretch/ice at home

## 2018-07-28 NOTE — Assessment & Plan Note (Signed)
Willing to make an effort to quit when smoking was mentioned as a contributor to bone health in reference to her chronic pain  Rx'd gum (doesn't like patches) and wellbutrin

## 2018-07-28 NOTE — Progress Notes (Signed)
    Subjective:  [name] [name] is a 52 y.o. female who presents to the [facility] today with a chief complaint of back pain.   HPI: Pain is chronic, not particularly changing but consistent, bilateral, radiates from lumbar through rear buttocks and down back of leg to app. Knee.  No trauma, no incontinence, no IV drug use, no paresthesia/paralyisis.  Patient still rides bikes and walks with her grandchildren who she is very involved with.   Objective:  Physical Exam: BP 110/64   Pulse (!) 50   Temp 97.8 F (36.6 C) (Oral)   Ht 5' 1.5" (1.562 m)   Wt 143 lb 6.4 oz (65 kg)   SpO2 99%   BMI 26.66 kg/m   Gen: NAD, resting comfortably, thin Soft, Nontender, Nondistended. MSK: no edema, cyanosis, or clubbing noted.  Pain to palpation at lower back, normal gait, some wincing on standing, able to bend ~80deg Skin: warm, dry Neuro: grossly normal, moves all extremities Psych: Normal affect and thought content, pleasant  No results found for this or any previous visit (from the past 72 hour(s)).   Assessment/Plan:  TOBACCO DEPENDENCE Willing to make an effort to quit when smoking was mentioned as a contributor to bone health in reference to her chronic pain  Rx'd gum (doesn't like patches) and wellbutrin  Chronic bilateral low back pain Chronic problem, referal to physical therapy w/ instructions to stretch/ice at home   Sherene Sires, [place] - PGY2 07/28/2018 9:30 AM

## 2018-08-11 ENCOUNTER — Observation Stay (HOSPITAL_COMMUNITY)
Admission: EM | Admit: 2018-08-11 | Discharge: 2018-08-12 | Disposition: A | Discharge status: Home / Self Care | Payer: Medicare Other | Attending: Family Medicine | Admitting: Family Medicine

## 2018-08-11 ENCOUNTER — Encounter (HOSPITAL_COMMUNITY): Payer: Self-pay | Admitting: Emergency Medicine

## 2018-08-11 ENCOUNTER — Other Ambulatory Visit: Payer: Self-pay

## 2018-08-11 ENCOUNTER — Emergency Department (HOSPITAL_COMMUNITY): Payer: Medicare Other

## 2018-08-11 DIAGNOSIS — E43 Unspecified severe protein-calorie malnutrition: Secondary | ICD-10-CM | POA: Diagnosis not present

## 2018-08-11 DIAGNOSIS — M2041 Other hammer toe(s) (acquired), right foot: Secondary | ICD-10-CM | POA: Insufficient documentation

## 2018-08-11 DIAGNOSIS — K3184 Gastroparesis: Secondary | ICD-10-CM | POA: Insufficient documentation

## 2018-08-11 DIAGNOSIS — R4182 Altered mental status, unspecified: Secondary | ICD-10-CM | POA: Insufficient documentation

## 2018-08-11 DIAGNOSIS — Z8249 Family history of ischemic heart disease and other diseases of the circulatory system: Secondary | ICD-10-CM | POA: Insufficient documentation

## 2018-08-11 DIAGNOSIS — N184 Chronic kidney disease, stage 4 (severe): Secondary | ICD-10-CM | POA: Diagnosis not present

## 2018-08-11 DIAGNOSIS — E1143 Type 2 diabetes mellitus with diabetic autonomic (poly)neuropathy: Secondary | ICD-10-CM | POA: Diagnosis not present

## 2018-08-11 DIAGNOSIS — Z9842 Cataract extraction status, left eye: Secondary | ICD-10-CM | POA: Insufficient documentation

## 2018-08-11 DIAGNOSIS — R079 Chest pain, unspecified: Secondary | ICD-10-CM | POA: Diagnosis not present

## 2018-08-11 DIAGNOSIS — M25511 Pain in right shoulder: Secondary | ICD-10-CM | POA: Insufficient documentation

## 2018-08-11 DIAGNOSIS — M25551 Pain in right hip: Secondary | ICD-10-CM | POA: Insufficient documentation

## 2018-08-11 DIAGNOSIS — R000 Tachycardia, unspecified: Secondary | ICD-10-CM | POA: Insufficient documentation

## 2018-08-11 DIAGNOSIS — Z91030 Bee allergy status: Secondary | ICD-10-CM | POA: Insufficient documentation

## 2018-08-11 DIAGNOSIS — N2581 Secondary hyperparathyroidism of renal origin: Secondary | ICD-10-CM | POA: Insufficient documentation

## 2018-08-11 DIAGNOSIS — Z794 Long term (current) use of insulin: Secondary | ICD-10-CM | POA: Diagnosis not present

## 2018-08-11 DIAGNOSIS — R1013 Epigastric pain: Secondary | ICD-10-CM

## 2018-08-11 DIAGNOSIS — M2042 Other hammer toe(s) (acquired), left foot: Secondary | ICD-10-CM | POA: Diagnosis not present

## 2018-08-11 DIAGNOSIS — E559 Vitamin D deficiency, unspecified: Secondary | ICD-10-CM | POA: Insufficient documentation

## 2018-08-11 DIAGNOSIS — D631 Anemia in chronic kidney disease: Secondary | ICD-10-CM | POA: Insufficient documentation

## 2018-08-11 DIAGNOSIS — Z881 Allergy status to other antibiotic agents status: Secondary | ICD-10-CM | POA: Insufficient documentation

## 2018-08-11 DIAGNOSIS — E875 Hyperkalemia: Secondary | ICD-10-CM | POA: Diagnosis not present

## 2018-08-11 DIAGNOSIS — M25552 Pain in left hip: Secondary | ICD-10-CM | POA: Insufficient documentation

## 2018-08-11 DIAGNOSIS — I252 Old myocardial infarction: Secondary | ICD-10-CM | POA: Insufficient documentation

## 2018-08-11 DIAGNOSIS — F411 Generalized anxiety disorder: Secondary | ICD-10-CM | POA: Diagnosis not present

## 2018-08-11 DIAGNOSIS — J309 Allergic rhinitis, unspecified: Secondary | ICD-10-CM | POA: Insufficient documentation

## 2018-08-11 DIAGNOSIS — G8929 Other chronic pain: Secondary | ICD-10-CM | POA: Insufficient documentation

## 2018-08-11 DIAGNOSIS — K210 Gastro-esophageal reflux disease with esophagitis: Secondary | ICD-10-CM | POA: Insufficient documentation

## 2018-08-11 DIAGNOSIS — Z9841 Cataract extraction status, right eye: Secondary | ICD-10-CM | POA: Insufficient documentation

## 2018-08-11 DIAGNOSIS — G43A1 Cyclical vomiting, intractable: Principal | ICD-10-CM | POA: Insufficient documentation

## 2018-08-11 DIAGNOSIS — K5900 Constipation, unspecified: Secondary | ICD-10-CM | POA: Insufficient documentation

## 2018-08-11 DIAGNOSIS — I1310 Hypertensive heart and chronic kidney disease without heart failure, with stage 1 through stage 4 chronic kidney disease, or unspecified chronic kidney disease: Secondary | ICD-10-CM | POA: Diagnosis not present

## 2018-08-11 DIAGNOSIS — R1110 Vomiting, unspecified: Secondary | ICD-10-CM | POA: Diagnosis present

## 2018-08-11 DIAGNOSIS — Z886 Allergy status to analgesic agent status: Secondary | ICD-10-CM | POA: Insufficient documentation

## 2018-08-11 DIAGNOSIS — Z811 Family history of alcohol abuse and dependence: Secondary | ICD-10-CM | POA: Insufficient documentation

## 2018-08-11 DIAGNOSIS — R011 Cardiac murmur, unspecified: Secondary | ICD-10-CM | POA: Insufficient documentation

## 2018-08-11 DIAGNOSIS — E7800 Pure hypercholesterolemia, unspecified: Secondary | ICD-10-CM | POA: Insufficient documentation

## 2018-08-11 DIAGNOSIS — E861 Hypovolemia: Secondary | ICD-10-CM | POA: Insufficient documentation

## 2018-08-11 DIAGNOSIS — Z809 Family history of malignant neoplasm, unspecified: Secondary | ICD-10-CM | POA: Insufficient documentation

## 2018-08-11 DIAGNOSIS — E1122 Type 2 diabetes mellitus with diabetic chronic kidney disease: Secondary | ICD-10-CM | POA: Diagnosis not present

## 2018-08-11 DIAGNOSIS — E1165 Type 2 diabetes mellitus with hyperglycemia: Secondary | ICD-10-CM | POA: Diagnosis not present

## 2018-08-11 DIAGNOSIS — Z91048 Other nonmedicinal substance allergy status: Secondary | ICD-10-CM | POA: Insufficient documentation

## 2018-08-11 DIAGNOSIS — F329 Major depressive disorder, single episode, unspecified: Secondary | ICD-10-CM | POA: Insufficient documentation

## 2018-08-11 DIAGNOSIS — Z833 Family history of diabetes mellitus: Secondary | ICD-10-CM | POA: Insufficient documentation

## 2018-08-11 DIAGNOSIS — Z79899 Other long term (current) drug therapy: Secondary | ICD-10-CM | POA: Insufficient documentation

## 2018-08-11 DIAGNOSIS — M545 Low back pain: Secondary | ICD-10-CM | POA: Insufficient documentation

## 2018-08-11 DIAGNOSIS — Z888 Allergy status to other drugs, medicaments and biological substances status: Secondary | ICD-10-CM | POA: Insufficient documentation

## 2018-08-11 DIAGNOSIS — Z90710 Acquired absence of both cervix and uterus: Secondary | ICD-10-CM | POA: Insufficient documentation

## 2018-08-11 DIAGNOSIS — F17210 Nicotine dependence, cigarettes, uncomplicated: Secondary | ICD-10-CM | POA: Insufficient documentation

## 2018-08-11 LAB — CBC
HCT: 37.7 % (ref 36.0–46.0)
Hemoglobin: 12.9 g/dL (ref 12.0–15.0)
MCH: 31.2 pg (ref 26.0–34.0)
MCHC: 34.2 g/dL (ref 30.0–36.0)
MCV: 91.3 fL (ref 78.0–100.0)
Platelets: 279 10*3/uL (ref 150–400)
RBC: 4.13 MIL/uL (ref 3.87–5.11)
RDW: 13.4 % (ref 11.5–15.5)
WBC: 13.6 10*3/uL — ABNORMAL HIGH (ref 4.0–10.5)

## 2018-08-11 LAB — BASIC METABOLIC PANEL
Anion gap: 16 — ABNORMAL HIGH (ref 5–15)
Anion gap: 17 — ABNORMAL HIGH (ref 5–15)
BUN: 35 mg/dL — AB (ref 6–20)
BUN: 39 mg/dL — AB (ref 6–20)
CALCIUM: 10.2 mg/dL (ref 8.9–10.3)
CHLORIDE: 106 mmol/L (ref 98–111)
CO2: 19 mmol/L — AB (ref 22–32)
CO2: 19 mmol/L — ABNORMAL LOW (ref 22–32)
Calcium: 10.6 mg/dL — ABNORMAL HIGH (ref 8.9–10.3)
Chloride: 106 mmol/L (ref 98–111)
Creatinine, Ser: 2.67 mg/dL — ABNORMAL HIGH (ref 0.44–1.00)
Creatinine, Ser: 2.98 mg/dL — ABNORMAL HIGH (ref 0.44–1.00)
GFR calc Af Amer: 22 mL/min — ABNORMAL LOW (ref >60)
GFR calc Af Amer: 20 mL/min — ABNORMAL LOW (ref >60)
GFR calc non Af Amer: 19 mL/min — ABNORMAL LOW (ref >60)
GFR, EST NON AFRICAN AMERICAN: 17 mL/min — AB (ref >60)
GLUCOSE: 431 mg/dL — AB (ref 70–99)
Glucose, Bld: 400 mg/dL — ABNORMAL HIGH (ref 70–99)
Potassium: 4.1 mmol/L (ref 3.5–5.1)
Potassium: 3.8 mmol/L (ref 3.5–5.1)
SODIUM: 141 mmol/L (ref 135–145)
Sodium: 142 mmol/L (ref 135–145)

## 2018-08-11 LAB — I-STAT VENOUS BLOOD GAS, ED
ACID-BASE DEFICIT: 4 mmol/L — AB (ref 0.0–2.0)
Bicarbonate: 20.6 mmol/L (ref 20.0–28.0)
O2 SAT: 72 %
PO2 VEN: 38 mmHg (ref 32.0–45.0)
Patient temperature: 98.3
TCO2: 22 mmol/L (ref 22–32)
pCO2, Ven: 35.1 mmHg — ABNORMAL LOW (ref 44.0–60.0)
pH, Ven: 7.376 (ref 7.250–7.430)

## 2018-08-11 LAB — URINALYSIS, ROUTINE W REFLEX MICROSCOPIC
BILIRUBIN URINE: NEGATIVE
Glucose, UA: >=500 mg/dL — AB
Ketones, ur: 5 mg/dL — AB
Leukocytes, UA: NEGATIVE
Nitrite: NEGATIVE
PROTEIN: 100 mg/dL — AB
Specific Gravity, Urine: 1.013 (ref 1.005–1.030)
pH: 5 (ref 5.0–8.0)

## 2018-08-11 LAB — LIPASE, BLOOD: Lipase: 69 U/L — ABNORMAL HIGH (ref 11–51)

## 2018-08-11 LAB — I-STAT TROPONIN, ED: Troponin i, poc: 0.01 ng/mL (ref 0.00–0.08)

## 2018-08-11 LAB — TROPONIN I

## 2018-08-11 MED ORDER — HALOPERIDOL LACTATE 5 MG/ML IJ SOLN
2.0000 mg | Freq: Once | INTRAMUSCULAR | Status: AC
  Administered 2018-08-11: 2 mg via INTRAVENOUS
  Filled 2018-08-11: qty 1

## 2018-08-11 MED ORDER — LACTATED RINGERS IV BOLUS
1000.0000 mL | Freq: Once | INTRAVENOUS | Status: AC
  Administered 2018-08-12: 1000 mL via INTRAVENOUS

## 2018-08-11 MED ORDER — LACTATED RINGERS IV BOLUS
1000.0000 mL | Freq: Once | INTRAVENOUS | Status: AC
  Administered 2018-08-11: 1000 mL via INTRAVENOUS

## 2018-08-11 MED ORDER — ONDANSETRON 4 MG PO TBDP
4.0000 mg | ORAL_TABLET | Freq: Once | ORAL | Status: DC | PRN
  Filled 2018-08-11 (×2): qty 1

## 2018-08-11 MED ORDER — LORAZEPAM 2 MG/ML IJ SOLN
0.5000 mg | Freq: Once | INTRAMUSCULAR | Status: AC
  Administered 2018-08-11: 0.5 mg via INTRAVENOUS
  Filled 2018-08-11: qty 1

## 2018-08-11 MED ORDER — PROMETHAZINE HCL 25 MG/ML IJ SOLN
12.5000 mg | Freq: Once | INTRAMUSCULAR | Status: AC
  Administered 2018-08-11: 12.5 mg via INTRAVENOUS
  Filled 2018-08-11: qty 1

## 2018-08-11 MED ORDER — INSULIN ASPART 100 UNIT/ML ~~LOC~~ SOLN
6.0000 [IU] | Freq: Once | SUBCUTANEOUS | Status: AC
  Administered 2018-08-11: 6 [IU] via INTRAVENOUS
  Filled 2018-08-11: qty 1

## 2018-08-11 NOTE — ED Provider Notes (Signed)
[facility] EMERGENCY DEPARTMENT Provider Note   CSN: [id number] Arrival date & time: 08/11/18  1742    History   Chief Complaint Chief Complaint  Patient presents with  . Chest Pain  . Emesis    HPI [name] [name] is a 52 y.o. female is a 52 year old female with CKD, cyclical vomiting syndrome, gastroparesis, prior MI, hypertension, and diabetes who presents due to 1 day of chest pain and vomiting.  She says she woke up this morning with severe substernal chest pain.  It does not radiate.  She says that she has felt short of breath intermittently.  She says this feels similar to her gastroparesis, for which she takes Ativan.  She says it is not common for her to have chest pain like this.  Nothing has alleviated her symptoms.  She reports vomiting multiple times today.  Her abdominal pain is dull and severe. HPI  Past Medical History:  Diagnosis Date  . Abdominal pain, generalized   . Acquired bilateral hammer toes 01/20/2017  . Anemia   . Anxiety   . Arthritis   . At risk for polypharmacy 08/15/2014  . Biceps tendinopathy of right upper extremity 04/05/2014  . Blood transfusion 2003  . Cataract    both eyes  . Chronic generalized abdominal pain   . Chronic generalized abdominal pain   . Chronic kidney disease (CKD), stage III (moderate) (HCC)   . Chronic leg cramping 07/18/2014  . CKD (chronic kidney disease) 08/17/2013   Seen by Dr Joelyn Oms 07/28/15 ([place]) - (see scanned document): 20 years diabetes, 15 years HTN, stable CKD stage III with proteinuria.  - CKD, discussed NSAID avoidance, diabetes control with target A1c<7.5%, and HTN control with SBP<140. F/u 6 months and repeat renal panel at that time. - Microalbuminuria: On lisinopril '40mg'$ , suspected secondary due to diabetic nephropathy. - Chronic back pain: No NSDAIDs. - Say amlodipine causes nausea  - Advised not to ACE-i during vomiting episodes    . Complication of anesthesia    "problems  waking up"  . Cyclic vomiting syndrome   . Delayed gastric emptying 05/12/2011    04/12/11 NUCLEAR MEDICINE GASTRIC EMPTYING SCAN Radiopharmaceutical: 2.0 mCi Tc-10msulfur colloid. Comparison: None. Findings: At 1 hour, 81% of the counts remain in the stomach. At 2 hours, 53% remain. Normal is less than 30% at 2 hours. IMPRESSION: Moderate delay in gastric emptying.      . Depression   . Depression with anxiety 02/23/2007   See [facility] Note dated 05/06/2016.  IC (Gwenlyn Saran was called in and I did a brief assessment.   . Diabetic gastroparesis ([place]    /e-chart  . Diabetic ketoacidosis without coma associated with type 2 diabetes mellitus ([place]   . DKA (diabetic ketoacidoses) ([place] 08/03/2016  . Dysuria 10/01/2016  . Elevated cholesterol 11/28/2015  . Emotional stress 12/30/2015   Stress after Breakup with partnes of 13 years on 12/27/2015   . Encounter for chronic pain management 01/06/2015   Indication for chronic opioid: Chronic back pain, cyclic vomiting Medication and dose: oxycodone '5mg'$   # pills per month: 15 Last UDS date: 05/08/2015 - result pending Pain contract signed (Y/N): Yes, reviewed 01/06/2015  Date narcotic database last reviewed (include red flags): 05/08/2015 (no red flags).    . Esophagitis 04/18/2013   Documented by Dr. PHenrene PastorEGD 03/2011  He also consulted on her during hospitalization [id number] cyclical vomiting. No repeat EGD necessary, treat esophagitis with PPI.    .Marland Kitchen  Essential hypertension 05/01/2016  . Essential hypertension, malignant 02/23/2007   Jan 2015 - stopped recently-started norvasc due to reports of GI symptoms. 02/2015 - holding lisinopril, adding hydralazine '10mg'$  TID, and will take 1/2 tab lisinopril when feeling better when not in cyclic vomiting flare   . Essential hypertension, malignant 02/23/2007   Jan 2015 - stopped recently-started norvasc due to reports of GI symptoms. 02/2015 - holding lisinopril, adding hydralazine '10mg'$  TID, and will take 1/2 tab  lisinopril when feeling better when not in cyclic vomiting flare   . GERD (gastroesophageal reflux disease)   . Heart murmur   . Hematuria 07/09/2015  . High risk social situation 08/31/2012  . Hot flashes 07/29/2011  . Hypertension   . Incontinence 10/03/2017  . INSOMNIA NOS 02/23/2007   Qualifier: Diagnosis of  By: Beryle Lathe    . Intermittent constipation 06/06/2015  . Intestinal impaction ([place])   . Intractable vomiting 07/02/2016  . Marijuana smoker, continuous    pt. denies does smoke marijuana  . MIGRAINE, UNSPEC., W/O INTRACTABLE MIGRAINE 02/23/2007   Qualifier: Diagnosis of  By: Beryle Lathe    . Myocardial infarction [facility]) 07/2007   "they say I've had a silent one; I don't know"  . Nausea and vomiting in adult 06/06/2013  . Neuromuscular disorder (HCC)    neuropathy in both legs  . NEUROPATHY, DIABETIC 02/23/2007  . Nonspecific low back pain 04/10/2007   Back pain is chronic. Oxycodone per prior notes helps pt be more active. Reportedly uses 4 tab/week oxy for intermittent worsening of chronic back pain but mostly for cyclic vom to keep her out of hospital.    . Normocytic anemia   . Onychomycosis 03/25/2015  . PANCREATITIS 05/09/2008  . Pneumonia   . Protein-calorie malnutrition, severe ([place]) 12/17/2013  . Pure hypercholesterolemia 08/15/2014   Based on her choleserol, BP, smoker status, and that she is a diabetic, her 10 year ASCVD risk is 9.9% putting her in a category of risk that would benefit from high-intensity statin.    . Rotator cuff syndrome 04/19/2014   rt. shoulder  . Smoker   . TOBACCO DEPENDENCE 02/23/2007   2 cigarettes every few days 11/2012 3 cigarettes daily    . Type II diabetes mellitus ([place])   . Uncontrolled secondary diabetes mellitus with stage 3 CKD (GFR 30-59) (HCC) 10/23/2011   Nephropathy, gastroparesis  HgbA1c (06/2011) 7.6, 9.5, 12.2, 8.2, 10.9 (07/2012), 9.2 (02/11/2013), 7.7 (09/27/13)  Metformin: she has a history of cyclical vomiting; she would  prefer not to take this medication  On lanuts 20U qam with improved control. 09/27/13   . Vitamin D deficiency 11/28/2014    Patient Active Problem List   Diagnosis Date Noted  . Chronic right shoulder pain 03/31/2018  . Hip pain, bilateral 03/31/2018  . Incontinence 10/03/2017  . Healthcare maintenance 09/02/2017  . Stye external 09/02/2017  . Pre-ulcerative calluses 05/27/2017  . Screening for colon cancer 05/27/2017  . Primary income source is welfare benefits 03/10/2017  . Acquired bilateral hammer toes 01/20/2017  . Port-A-Cath in place 01/20/2017  . Secondary hyperparathyroidism of renal origin ([place]) 10/05/2016  . Recurrent major depressive disorder ([place])   . Essential hypertension 05/01/2016  . Diabetic gastroparesis associated with type 2 diabetes mellitus ([place]) 05/01/2016  . DM (diabetes mellitus), type 2 with complications ([place])   . Hyperkalemia 11/28/2015  . Normocytic anemia   . Intermittent constipation 06/06/2015  . Encounter for monitoring opioid maintenance therapy 01/06/2015  . Vitamin D deficiency 11/28/2014  .  Pure hypercholesterolemia 08/15/2014  . Chronic kidney disease (CKD) stage G4/A2, severely decreased glomerular filtration rate (GFR) between 15-29 mL/min/1.73 square meter and albuminuria creatinine ratio between 30-299 mg/g (HCC) 08/17/2013  . Esophagitis determined by endoscopy 04/18/2013  . Allergic rhinitis, seasonal 05/04/2012  . Marijuana smoker ([place]) 11/03/2011  . Cyclic vomiting syndrome 08/03/2011  . Chronic bilateral low back pain 04/10/2007  . Anxiety disorder 02/23/2007  . TOBACCO DEPENDENCE 02/23/2007  . DM neuropathies ([place]) 02/23/2007  . GERD with esophagitis 01/27/2004    Past Surgical History:  Procedure Laterality Date  . ABDOMINAL HYSTERECTOMY  02/2002  . CARDIAC CATHETERIZATION    . CATARACT EXTRACTION W/ INTRAOCULAR LENS  IMPLANT, BILATERAL    . CESAREAN SECTION     x 3  . CHOLECYSTECTOMY  1980's  . COLONOSCOPY     patient  not sure  . DILATION AND EVACUATION  X3  . laparotomy and lysis of adhesions    . left hand surgery    . PORT-A-CATH placement  ~ 2008   right chest; "poor access; frequent hospitalizations"  . SMALL INTESTINE SURGERY     laporotomy with lysis of adhesions.  Marland Kitchen UPPER GASTROINTESTINAL ENDOSCOPY    . UPPER GI ENDOSCOPY       OB History    Gravida  7   Para      Term      Preterm      AB  3   Living  4     SAB      TAB  3   Ectopic      Multiple      Live Births               Home Medications    Prior to Admission medications   Medication Sig Start Date End Date Taking? Authorizing Provider  AMITIZA 24 MCG capsule TAKE 1 CAPSULE(24 MCG) BY MOUTH TWICE DAILY WITH A MEAL 03/31/18  Yes McDiarmid, Blane Ohara, MD  amLODipine (NORVASC) 5 MG tablet Take 1 tablet (5 mg total) by mouth daily. 04/11/18  Yes McDiarmid, Blane Ohara, MD  Blood Glucose Monitoring Suppl (ONE TOUCH ULTRA SYSTEM KIT) w/Device KIT 1 kit by Does not apply route 2 (two) times daily before a meal. 01/20/17  Yes McDiarmid, Blane Ohara, MD  carvedilol (COREG) 6.25 MG tablet Take 1 tablet (6.25 mg total) by mouth 2 (two) times daily with a meal. 03/10/17  Yes McDiarmid, Blane Ohara, MD  Insulin Glargine (BASAGLAR KWIKPEN) 100 UNIT/ML SOPN Inject 20 Units into the skin daily.  03/30/18  Yes McDiarmid, Blane Ohara, MD  Insulin Pen Needle (PEN NEEDLES) 30G X 5 MM MISC 12 Units by Does not apply route daily. 07/21/17  Yes McDiarmid, Blane Ohara, MD  LORazepam (ATIVAN) 1 MG tablet Take 1 tablet (1 mg total) by mouth every 6 (six) hours as needed (cyclic vomiting). Patient taking differently: Take 1 mg by mouth every 6 (six) hours as needed for anxiety (cyclic vomiting).  03/30/18  Yes McDiarmid, Blane Ohara, MD  nicotine polacrilex (NICORETTE) 4 MG gum Take 1 each (4 mg total) by mouth as needed for smoking cessation. 07/28/18  Yes Bland, Scott, DO  buPROPion (WELLBUTRIN) 75 MG tablet TAKE 1 TABLET(75 MG) BY MOUTH TWICE DAILY Patient not taking: Reported  on 08/11/2018 07/28/18   McDiarmid, Blane Ohara, MD  diazepam (VALIUM) 5 MG tablet Take by mouth 1 hour before procedure and may repeat once at time of procedure Patient not taking: Reported on 08/11/2018 04/28/18  Dickie La, MD  gabapentin (NEURONTIN) 100 MG capsule Take 1 capsule (100 mg total) by mouth 3 (three) times daily. Patient not taking: Reported on 08/11/2018 03/30/18   McDiarmid, Blane Ohara, MD  oxyCODONE (OXY IR/ROXICODONE) 5 MG immediate release tablet Take 1 tablet (5 mg total) by mouth every 4 (four) hours as needed for severe pain. Patient not taking: Reported on 08/11/2018 06/02/18   McDiarmid, Blane Ohara, MD  promethazine (PHENERGAN) 12.5 MG tablet Take 1 tablet (12.5 mg total) by mouth every 6 (six) hours as needed for nausea or vomiting. Patient not taking: Reported on 08/11/2018 12/13/17   McDiarmid, Blane Ohara, MD  promethazine (PHENERGAN) 25 MG suppository Place 1 suppository (25 mg total) rectally every 6 (six) hours as needed for nausea or vomiting. Patient not taking: Reported on 08/11/2018 01/05/18   McDiarmid, Blane Ohara, MD    Family History Family History  Problem Relation Age of Onset  . Diabetes type II Mother   . Diabetes Mother   . Hypertension Mother   . Heart attack Mother   . Diabetes type II Sister   . Diabetes Sister   . Hypertension Sister   . Alcohol abuse Father   . Diabetes Sister   . Diabetes Sister   . Cancer Sister 1       Brain  . Colon cancer Neg Hx   . Colon polyps Neg Hx   . Esophageal cancer Neg Hx   . Rectal cancer Neg Hx   . Stomach cancer Neg Hx     Social History Social History   Tobacco Use  . Smoking status: Current Some Day Smoker    Packs/day: 3.00    Years: 22.00    Pack years: 66.00    Types: Cigarettes    Start date: 12/27/1982  . Smokeless tobacco: Never Used  . Tobacco comment: THC 1-2 times per day almost daily  Substance Use Topics  . Alcohol use: Yes    Alcohol/week: 0.0 standard drinks    Comment: rarely a beer every 6 months  .  Drug use: Yes    Types: Marijuana    Comment: Current Daily to weekly use     Allergies   Bee venom; Tegaderm ag mesh [silver]; Acetaminophen; Novolog [insulin aspart]; Statins; Lyrica [pregabalin]; Tape; Ceftriaxone; and Erythromycin   Review of Systems Review of Systems Review of Systems   Constitutional  Negative for fever  Negative for chills  HENT  Negative for ear pain  Negative for sore throat  Negative for difficultly swallowing  Eyes  Negative for eye pain  Negative for visual disturbance  Respiratory  +for shortness of breath  Negative for cough  CV  +for chest pain  Negative for leg swelling  Abdomen  +for abdominal pain  +for nausea  +for vomiting  MSK  Negative for extremity pain  Negative for back pain  Skin  Negative for rash  Negative for wound  Neuro  Negative for syncope  Negative for difficultly speaking  Psych  Negative for confusion   The remainder of the ROS was reviewed and negative except as documented above.      Physical Exam Updated Vital Signs BP (!) 201/110   Pulse (!) 124   Temp 98.3 F (36.8 C) (Oral)   Resp (!) 24   Ht '5\' 1"'$  (1.549 m)   Wt 64.9 kg   SpO2 100%   BMI 27.02 kg/m   Physical Exam Physical Exam Constitutional  Nursing notes reviewed  Vital  signs reviewed  Uncomfortable  Wretching  HEENT  No obvious trauma  Supple without meningismus, mass, or overt JVD  EOMI  No scleral icterus or injection  Respiratory  Effort normal  CTAB  No respiratory distress  CV  Tachycardic  No obvious murmurs  No pitting edema  Equal pulses in all extremities  Chest not tender to palpation  Abdomen  Soft  Epigastric tenderness  Non-distended  No peritonitis  MSK  Atraumatic  No obvious deformity  ROM appropriate  Skin  Warm  Dry  Dry mucous membranes  Neuro  Awake and alert  EOMI  Moving all extremities  Denies numbness/tingling  Psychiatric  Mood and affect  normal        ED Treatments / Results  Labs (all labs ordered are listed, but only abnormal results are displayed) Labs Reviewed  BASIC METABOLIC PANEL - Abnormal; Notable for the following components:      Result Value   CO2 19 (*)    Glucose, Bld 400 (*)    BUN 35 (*)    Creatinine, Ser 2.67 (*)    Calcium 10.6 (*)    GFR calc non Af Amer 19 (*)    GFR calc Af Amer 22 (*)    Anion gap 16 (*)    All other components within normal limits  CBC - Abnormal; Notable for the following components:   WBC 13.6 (*)    All other components within normal limits  LIPASE, BLOOD - Abnormal; Notable for the following components:   Lipase 69 (*)    All other components within normal limits  URINALYSIS, ROUTINE W REFLEX MICROSCOPIC - Abnormal; Notable for the following components:   APPearance HAZY (*)    Glucose, UA >=500 (*)    Hgb urine dipstick MODERATE (*)    Ketones, ur 5 (*)    Protein, ur 100 (*)    Bacteria, UA RARE (*)    All other components within normal limits  BASIC METABOLIC PANEL - Abnormal; Notable for the following components:   CO2 19 (*)    Glucose, Bld 431 (*)    BUN 39 (*)    Creatinine, Ser 2.98 (*)    GFR calc non Af Amer 17 (*)    GFR calc Af Amer 20 (*)    Anion gap 17 (*)    All other components within normal limits  I-STAT VENOUS BLOOD GAS, ED - Abnormal; Notable for the following components:   pCO2, Ven 35.1 (*)    Acid-base deficit 4.0 (*)    All other components within normal limits  TROPONIN I  BLOOD GAS, VENOUS  TROPONIN I  I-STAT TROPONIN, ED    EKG EKG Interpretation  Date/Time:  Friday August 11 2018 17:58:23 EDT Ventricular Rate:  119 PR Interval:  146 QRS Duration: 68 QT Interval:  338 QTC Calculation: 475 R Axis:   75 Text Interpretation:  Sinus tachycardia Baseline wander Biatrial enlargement No significant change since last tracing Abnormal ekg Confirmed by Carmin Muskrat [phone]) on 08/11/2018 8:46:44 PM   Radiology Dg Chest  2 View  Result Date: 08/11/2018 CLINICAL DATA:  Chest pain EXAM: CHEST - 2 VIEW COMPARISON:  11/18/2016 FINDINGS: Right chest wall central venous catheter tip is in the mid SVC. The heart size and mediastinal contours are within normal limits. Both lungs are clear. The visualized skeletal structures are unremarkable. IMPRESSION: No active cardiopulmonary disease. Electronically Signed   By: Ulyses Jarred M.D.   On: 08/11/2018 19:16  Procedures Procedures (including critical care time)  Medications Ordered in ED Medications  ondansetron (ZOFRAN-ODT) disintegrating tablet 4 mg (has no administration in time range)  lactated ringers bolus 1,000 mL (1,000 mLs Intravenous New Bag/Given 08/12/18 0003)  iopamidol (ISOVUE-300) 61 % injection (has no administration in time range)  haloperidol lactate (HALDOL) injection 2 mg (2 mg Intravenous Given 08/11/18 2146)  lactated ringers bolus 1,000 mL (0 mLs Intravenous Stopped 08/11/18 2254)  LORazepam (ATIVAN) injection 0.5 mg (0.5 mg Intravenous Given 08/11/18 2353)  promethazine (PHENERGAN) injection 12.5 mg (12.5 mg Intravenous Given 08/11/18 2355)  insulin aspart (novoLOG) injection 6 Units (6 Units Intravenous Given 08/11/18 2352)     Initial Impression / Assessment and Plan / ED Course  I have reviewed the triage vital signs and the nursing notes.  Pertinent labs & imaging results that were available during my care of the patient were reviewed by me and considered in my medical decision making (see chart for details).     [name] [name] presents with chest discomfort and vomiting as per above.  Regarding her chest pain, she is tachycardic and she has a history of MI.  She said this does not feel like her MI.  I do not think that this presentation is consistent with a PE given the overall description of her symptoms and her exam.  ACS is on the differential.  Her ECG reveals no evidence of acute ischemia.  Her initial troponin is not elevated.  Her  serial troponin is pending.  Chest x-ray reveals no pulmonary disease.  Regarding her abdominal pain, she does have focal epigastric tenderness.  I suspect that this is her gastroparesis.  She was given IV Haldol for this.  She reports moderate improvement in her symptoms.  Due to her ongoing discomfort, she was given Phenergan and Ativan, which she said further improved her symptoms.  She is also given 2 L IV fluids.  I have a low suspicion for appendicitis, hepatobiliary pathology, or ruptured viscous.  She has a mild leukocytosis to 13.6.  Her lipase is mildly elevated although not consistent with pancreatitis.  Her bicarb is 19 and her glucose is 400.  Her anion gap is 16.  Repeat BMP revealed an anion gap of 17 and a bicarb of 19.  She was given 8 units of IV insulin.  Although she is not acidemic and technically not in DKA, she is borderline and at high risk for going into DKA.  At the time of signout, a CT abdomen pelvis without contrast is pending.  She has CKD, precluding the use of IV contrast.  On reassessment, she was significantly better appearing.  She was no longer vomiting.  I discussed her case with family practice, which agreed to admit the patient.  She is stable for the floor currently.    Final Clinical Impressions(s) / ED Diagnoses   Final diagnoses:  Epigastric pain    ED Discharge Orders    None       Alford Highland, MD 08/12/18 Alen Blew, MD 08/14/18 1735

## 2018-08-11 NOTE — ED Triage Notes (Signed)
Pt reports diarrhea, emesis, and chest pain starting today. CP is on L side with no radiation. Pt also reports SHOB.

## 2018-08-11 NOTE — ED Notes (Signed)
Dr Vanita Panda informed of venous blood gas

## 2018-08-11 NOTE — ED Notes (Signed)
Pt ambulated to the bathroom and back to room with no assistance

## 2018-08-12 DIAGNOSIS — G43A1 Cyclical vomiting, intractable: Secondary | ICD-10-CM | POA: Diagnosis not present

## 2018-08-12 DIAGNOSIS — R1013 Epigastric pain: Secondary | ICD-10-CM

## 2018-08-12 DIAGNOSIS — R1110 Vomiting, unspecified: Secondary | ICD-10-CM | POA: Diagnosis present

## 2018-08-12 LAB — CBC
HCT: 30.2 % — ABNORMAL LOW (ref 36.0–46.0)
Hemoglobin: 10.4 g/dL — ABNORMAL LOW (ref 12.0–15.0)
MCH: 31.6 pg (ref 26.0–34.0)
MCHC: 34.4 g/dL (ref 30.0–36.0)
MCV: 91.8 fL (ref 78.0–100.0)
Platelets: 189 10*3/uL (ref 150–400)
RBC: 3.29 MIL/uL — AB (ref 3.87–5.11)
RDW: 13.2 % (ref 11.5–15.5)
WBC: 11.4 10*3/uL — ABNORMAL HIGH (ref 4.0–10.5)

## 2018-08-12 LAB — BASIC METABOLIC PANEL
Anion gap: 11 (ref 5–15)
BUN: 37 mg/dL — ABNORMAL HIGH (ref 6–20)
CO2: 22 mmol/L (ref 22–32)
Calcium: 9.2 mg/dL (ref 8.9–10.3)
Chloride: 109 mmol/L (ref 98–111)
Creatinine, Ser: 2.43 mg/dL — ABNORMAL HIGH (ref 0.44–1.00)
GFR calc non Af Amer: 22 mL/min — ABNORMAL LOW (ref >60)
GFR, EST AFRICAN AMERICAN: 25 mL/min — AB (ref >60)
Glucose, Bld: 273 mg/dL — ABNORMAL HIGH (ref 70–99)
Potassium: 3.7 mmol/L (ref 3.5–5.1)
SODIUM: 142 mmol/L (ref 135–145)

## 2018-08-12 LAB — TROPONIN I

## 2018-08-12 LAB — CBG MONITORING, ED: GLUCOSE-CAPILLARY: 296 mg/dL — AB (ref 70–99)

## 2018-08-12 LAB — GLUCOSE, CAPILLARY
GLUCOSE-CAPILLARY: 141 mg/dL — AB (ref 70–99)
Glucose-Capillary: 271 mg/dL — ABNORMAL HIGH (ref 70–99)
Glucose-Capillary: 103 mg/dL — ABNORMAL HIGH (ref 70–99)
Glucose-Capillary: 286 mg/dL — ABNORMAL HIGH (ref 70–99)

## 2018-08-12 MED ORDER — SODIUM CHLORIDE 0.9 % IV SOLN
INTRAVENOUS | Status: DC
  Administered 2018-08-12: 02:00:00 via INTRAVENOUS

## 2018-08-12 MED ORDER — OXYCODONE HCL 5 MG PO TABS
5.0000 mg | ORAL_TABLET | ORAL | Status: DC | PRN
  Administered 2018-08-12: 5 mg via ORAL
  Filled 2018-08-12: qty 1

## 2018-08-12 MED ORDER — NON FORMULARY: 1.0000 [IU] | Freq: Every day | Status: DC | PRN

## 2018-08-12 MED ORDER — IOPAMIDOL (ISOVUE-300) INJECTION 61%
INTRAVENOUS | Status: AC
  Filled 2018-08-12: qty 30

## 2018-08-12 MED ORDER — INSULIN GLARGINE 100 UNIT/ML ~~LOC~~ SOLN
5.0000 [IU] | Freq: Every day | SUBCUTANEOUS | 11 refills | Status: DC
Start: 2018-08-12 — End: 2018-09-24

## 2018-08-12 MED ORDER — LORAZEPAM 1 MG PO TABS: 1.0000 mg | ORAL_TABLET | Freq: Four times a day (QID) | ORAL | Status: DC | PRN

## 2018-08-12 MED ORDER — AMLODIPINE BESYLATE 5 MG PO TABS
5.0000 mg | ORAL_TABLET | Freq: Every day | ORAL | Status: DC
  Administered 2018-08-12 (×2): 5 mg via ORAL
  Filled 2018-08-12 (×3): qty 1

## 2018-08-12 MED ORDER — SODIUM CHLORIDE 0.9% FLUSH
10.0000 mL | INTRAVENOUS | Status: DC | PRN
  Administered 2018-08-12: 10 mL
  Filled 2018-08-12: qty 40

## 2018-08-12 MED ORDER — CARVEDILOL 6.25 MG PO TABS
6.2500 mg | ORAL_TABLET | Freq: Two times a day (BID) | ORAL | Status: DC
  Administered 2018-08-12 (×3): 6.25 mg via ORAL
  Filled 2018-08-12 (×3): qty 1

## 2018-08-12 MED ORDER — HEPARIN SODIUM (PORCINE) 5000 UNIT/ML IJ SOLN: 5000.0000 [IU] | Freq: Three times a day (TID) | INTRAMUSCULAR | Status: DC

## 2018-08-12 MED ORDER — SODIUM CHLORIDE 0.9 % IV SOLN: INTRAVENOUS | Status: DC | PRN

## 2018-08-12 MED ORDER — PROMETHAZINE HCL 25 MG PO TABS
25.0000 mg | ORAL_TABLET | Freq: Four times a day (QID) | ORAL | Status: DC | PRN
  Administered 2018-08-12: 25 mg via ORAL
  Filled 2018-08-12: qty 1

## 2018-08-12 MED ORDER — INSULIN ASPART 100 UNIT/ML ~~LOC~~ SOLN: 0.0000 [IU] | Freq: Three times a day (TID) | SUBCUTANEOUS | Status: DC

## 2018-08-12 MED ORDER — HEPARIN SOD (PORK) LOCK FLUSH 100 UNIT/ML IV SOLN
500.0000 [IU] | INTRAVENOUS | Status: AC | PRN
  Administered 2018-08-12: 500 [IU]

## 2018-08-12 MED ORDER — INSULIN ASPART 100 UNIT/ML ~~LOC~~ SOLN: 0.0000 [IU] | Freq: Every day | SUBCUTANEOUS | Status: DC

## 2018-08-12 MED ORDER — INSULIN REGULAR HUMAN 100 UNIT/ML IJ SOLN
0.0000 [IU] | Freq: Three times a day (TID) | INTRAMUSCULAR | Status: DC
Start: 2018-08-12 — End: 2018-08-12
  Administered 2018-08-12: 5 [IU] via SUBCUTANEOUS
  Administered 2018-08-12: 1 [IU] via SUBCUTANEOUS
  Filled 2018-08-12 (×2): qty 10

## 2018-08-12 MED ORDER — INSULIN GLARGINE 100 UNIT/ML ~~LOC~~ SOLN
5.0000 [IU] | Freq: Every day | SUBCUTANEOUS | Status: DC
  Administered 2018-08-12: 5 [IU] via SUBCUTANEOUS
  Filled 2018-08-12 (×2): qty 0.05

## 2018-08-12 NOTE — Discharge Summary (Signed)
[facility] Discharge Summary  Patient name: [name] [name] Medical record number: [id number] Date of birth: 01-19-1966 Age: 52 y.o. Gender: female Date of Admission: 08/11/2018  Date of Discharge: 08/12/2018 Admitting Physician: Martyn Malay, MD  Primary Care Provider: McDiarmid, Blane Ohara, MD Consultants: none  Indication for Hospitalization: intractable vomiting  Discharge Diagnoses/Problem List:  Patient Active Problem List   Diagnosis Date Noted  . Intractable vomiting 08/12/2018  . Chronic right shoulder pain 03/31/2018  . Hip pain, bilateral 03/31/2018  . Incontinence 10/03/2017  . Healthcare maintenance 09/02/2017  . Stye external 09/02/2017  . Pre-ulcerative calluses 05/27/2017  . Screening for colon cancer 05/27/2017  . Primary income source is welfare benefits 03/10/2017  . Acquired bilateral hammer toes 01/20/2017  . Port-A-Cath in place 01/20/2017  . Secondary hyperparathyroidism of renal origin ([place]) 10/05/2016  . Recurrent major depressive disorder ([place])   . Essential hypertension 05/01/2016  . Diabetic gastroparesis associated with type 2 diabetes mellitus ([place]) 05/01/2016  . DM (diabetes mellitus), type 2 with complications ([place])   . Hyperkalemia 11/28/2015  . Normocytic anemia   . Intermittent constipation 06/06/2015  . Encounter for monitoring opioid maintenance therapy 01/06/2015  . Vitamin D deficiency 11/28/2014  . Pure hypercholesterolemia 08/15/2014  . Chronic kidney disease (CKD) stage G4/A2, severely decreased glomerular filtration rate (GFR) between 15-29 mL/min/1.73 square meter and albuminuria creatinine ratio between 30-299 mg/g (H[facility]) 08/17/2013  . Esophagitis determined by endoscopy 04/18/2013  . Allergic rhinitis, seasonal 05/04/2012  . Marijuana smoker ([place]) 11/03/2011  . Cyclic vomiting syndrome 08/03/2011  . Chronic bilateral low back pain 04/10/2007  . Anxiety disorder 02/23/2007  . TOBACCO DEPENDENCE  02/23/2007  . DM neuropathies ([place]) 02/23/2007  . GERD with esophagitis 01/27/2004   Disposition: Home  Discharge Condition: Stable/Improved  Discharge Exam:  General: NAD, comfortable Cardiovascular: RRR, no m/r/g Respiratory: CTA bil, no W/R/R Abdomen: mildly tender in epigastric region, no rebound or guarding Extremities: no LE edema  Brief Hospital Course:  52 year old female with a history of diabetic gastroparesis was admitted for intractable vomiting. Emesis was controlled in ED by phenergan, haldol and ativan. Patient was additionally given 2 x 1 L IVF boluses and started on mIVF. She was noted to be hyperglycemic which was likely because she held her insulin when she was unable to keep down food all day. She was started on a low dose insulin to help control blood sugars >400 and then home insulin was restarted once she was tolerating a diet. She was started on clears and diet was advanced as she tolerated. She was tolerating renal diet at discharge. Patient prefers to use lantus but informed her that her insurance may not cover this. She is to continue her home dose of the basaglar if this is too expensive.  Issues for Follow Up:  1. Follow up patient's sugars on lantus, follow up if patient able to get lantus 2. Review patient's nausea and ability to keep food down 3. Patient interested in having port removed  Significant Procedures: None  Significant Labs and Imaging:  Recent Labs  Lab 08/11/18 1827 08/12/18 0328  WBC 13.6* 11.4*  HGB 12.9 10.4*  HCT 37.7 30.2*  PLT 279 189   Recent Labs  Lab 08/11/18 1827 08/11/18 2158 08/12/18 0328  NA 141 142 142  K 4.1 3.8 3.7  CL 106 106 109  CO2 19* 19* 22  GLUCOSE 400* 431* 273*  BUN 35* 39* 37*  CREATININE 2.67* 2.98* 2.43*  CALCIUM 10.6* 10.2 9.2      Results/Tests Pending at Time of Discharge: none  Discharge Medications:  Allergies as of 08/12/2018      Reactions   Bee Venom Anaphylaxis   Required  hospital visit to ER   Tegaderm Ag Mesh [silver] Dermatitis   And [place] tape- causes blisters and itching   Acetaminophen Nausea And Vomiting   Novolog [insulin Aspart] Other (See Comments)   Muscles in feet cramp   Statins Other (See Comments)   GI intolerance (N/V)   Lyrica [pregabalin] Other (See Comments)   Leg cramping   Tape    Blisters   Ceftriaxone Other (See Comments)   Caused an ulcer in her mouth   Erythromycin Nausea And Vomiting      Medication List    STOP taking these medications   BASAGLAR KWIKPEN 100 UNIT/ML Sopn Replaced by:  insulin glargine 100 UNIT/ML injection     TAKE these medications   AMITIZA 24 MCG capsule Generic drug:  lubiprostone TAKE 1 CAPSULE(24 MCG) BY MOUTH TWICE DAILY WITH A MEAL   amLODipine 5 MG tablet Commonly known as:  NORVASC Take 1 tablet (5 mg total) by mouth daily.   buPROPion 75 MG tablet Commonly known as:  WELLBUTRIN TAKE 1 TABLET(75 MG) BY MOUTH TWICE DAILY   carvedilol 6.25 MG tablet Commonly known as:  COREG Take 1 tablet (6.25 mg total) by mouth 2 (two) times daily with a meal.   diazepam 5 MG tablet Commonly known as:  VALIUM Take by mouth 1 hour before procedure and may repeat once at time of procedure   gabapentin 100 MG capsule Commonly known as:  NEURONTIN Take 1 capsule (100 mg total) by mouth 3 (three) times daily.   insulin glargine 100 UNIT/ML injection Commonly known as:  LANTUS Inject 0.05 mLs (5 Units total) into the skin at bedtime. Replaces:  BASAGLAR KWIKPEN 100 UNIT/ML Sopn   LORazepam 1 MG tablet Commonly known as:  ATIVAN Take 1 tablet (1 mg total) by mouth every 6 (six) hours as needed (cyclic vomiting). What changed:  reasons to take this   nicotine polacrilex 4 MG gum Commonly known as:  NICORETTE Take 1 each (4 mg total) by mouth as needed for smoking cessation.   ONE TOUCH ULTRA SYSTEM KIT w/Device Kit 1 kit by Does not apply route 2 (two) times daily before a meal.    oxyCODONE 5 MG immediate release tablet Commonly known as:  Oxy IR/ROXICODONE Take 1 tablet (5 mg total) by mouth every 4 (four) hours as needed for severe pain.   Pen Needles 30G X 5 MM Misc 12 Units by Does not apply route daily.   promethazine 12.5 MG tablet Commonly known as:  PHENERGAN Take 1 tablet (12.5 mg total) by mouth every 6 (six) hours as needed for nausea or vomiting.   promethazine 25 MG suppository Commonly known as:  PHENERGAN Place 1 suppository (25 mg total) rectally every 6 (six) hours as needed for nausea or vomiting.       Discharge Instructions: Please refer to Patient Instructions section of EMR for full details.  Patient was counseled important signs and symptoms that should prompt return to medical care, changes in medications, dietary instructions, activity restrictions, and follow up appointments.   Follow-Up Appointments:   Guadalupe Dawn, MD 08/12/2018, 6:48 PM PGY-2, [place]

## 2018-08-12 NOTE — Progress Notes (Signed)
New Admission Note:   Arrival Method: Arrived from ED via stretcher Mental Orientation: Alert and oriented x4 Telemetry: N/A Assessment: Completed Skin: Intact IV: Rt chest Porta cath Pain: Denies Tubes: N/A Safety Measures: Safety Fall Prevention Plan has been discuseed. Admission: Completed 5MW Orientation: Patient has been orientated to the room, unit and staff.  Family: son at bedside  Orders have been reviewed and implemented. Will continue to monitor the patient. Call light has been placed within reach and bed alarm has been activated.   Everrett Lacasse [employer], RN-BC Phone number: [phone]

## 2018-08-12 NOTE — Progress Notes (Signed)
Family Medicine Teaching Service Daily Progress Note Intern Pager: [phone]  Patient name: [name] [name] Medical record number: [id number] Date of birth: [birth year] Age: 52 y.o. Gender: female  Primary Care Provider: McDiarmid, Blane Ohara, MD Consultants: None Code Status: Full  Pt Overview and Major Events to Date:  08/11/2018 - admit for intractable vomiting and hyperglycemia  Assessment and Plan: Cyclic Vomiting/intractable vomiting - exacerbation in setting of chronic diabetic gastroparesis. Long term problem managed with oxycodone 5 mg and ativan as abortive therapy. Additionally has home phenergan and haldol at home as a PRN.  Tolerating sips this morning with mild nausea. Will add back phenergan PRN. If this is not sufficient, ok to add back home oxycodone and ativan as needed.  - DC IV fluids. - sips>> clear liquids, ADAT  Hyperglycemia on T2DM  - CBG improved to 200s overnight with 5u Lantus. Last A1c 03/30/2018 was 7.5. Home meds insulin glargine 20u daily. - add back home insulin once taking PO - continue sliding scale   AMS - sleepy on admission due to a number of meds she received in ED. This AM alert and oriented, appropriate  Tachycardia and HTN - Resolved. Was likely due to inability to keep down norvasc and coreg prior to admission.  CKD stage 4 Cr stable at baseline on admission Estimated Creatinine Clearance: 22.2 mL/min (A) (by C-G formula based on SCr of 2.43 mg/dL (H)). - monitor on AM BMP  Tobacco Dependence  - can order nicotine patch if needed  Anxiety Disorder - holding valium, ativan due to sedation on admission, can add back ativan once she is less sleepy -  Held wellbutrin that is listed because patient states she is not taking it  FEN/GI: mIVF, clear liquid diet ADAT Prophylaxis: heparin   Disposition: home pending ability to stay hydrated by mouth  Subjective:  Doing well this morning. Wakes easily. Mildly nauseous but tolerating  sips.  Objective: Temp:  [98.3 F (36.8 C)-98.9 F (37.2 C)] 98.4 F (36.9 C) (08/17 0440) Pulse Rate:  [67-124] 67 (08/17 0440) Resp:  [16-24] 18 (08/17 0440) BP: (123-224)/(57-129) 123/57 (08/17 0440) SpO2:  [100 %] 100 % (08/17 0440) Weight:  [59.7 kg-64.9 kg] 59.7 kg (08/17 0205) Physical Exam: General: NAD, comfortable Cardiovascular: RRR, no m/r/g Respiratory: CTA bil, no W/R/R Abdomen: mildly tender in epigastric region, no rebound or guarding Extremities: no LE edema  Laboratory: Recent Labs  Lab 08/11/18 1827 08/12/18 0328  WBC 13.6* 11.4*  HGB 12.9 10.4*  HCT 37.7 30.2*  PLT 279 189   Recent Labs  Lab 08/11/18 1827 08/11/18 2158 08/12/18 0328  NA 141 142 142  K 4.1 3.8 3.7  CL 106 106 109  CO2 19* 19* 22  BUN 35* 39* 37*  CREATININE 2.67* 2.98* 2.43*  CALCIUM 10.6* 10.2 9.2  GLUCOSE 400* 431* 273*      Imaging/Diagnostic Tests:   Everrett Coombe, MD 08/12/2018, 5:30 AM PGY-3, [place] Intern pager: [phone], text pages welcome

## 2018-08-12 NOTE — Progress Notes (Signed)
Patient discharged home per MD. PAC deaccessed. Discharge instructions reviewed. Patient declined offer for wheelchair wanting to walk out on her own. Bartholomew Crews, RN
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
## 2018-08-15 ENCOUNTER — Encounter: Payer: Self-pay | Admitting: Family Medicine

## 2018-08-15 DIAGNOSIS — I5189 Other ill-defined heart diseases: Secondary | ICD-10-CM

## 2018-08-15 DIAGNOSIS — I519 Heart disease, unspecified: Secondary | ICD-10-CM | POA: Insufficient documentation

## 2018-08-15 HISTORY — DX: Other ill-defined heart diseases: I51.89

## 2018-09-21 ENCOUNTER — Other Ambulatory Visit: Payer: Self-pay

## 2018-09-21 ENCOUNTER — Ambulatory Visit (INDEPENDENT_AMBULATORY_CARE_PROVIDER_SITE_OTHER): Payer: Medicare Other | Admitting: Family Medicine

## 2018-09-21 ENCOUNTER — Encounter: Payer: Self-pay | Admitting: Family Medicine

## 2018-09-21 VITALS — BP 140/70 | HR 57 | Temp 97.9°F | Ht 62.0 in | Wt 143.0 lb

## 2018-09-21 DIAGNOSIS — G43A0 Cyclical vomiting, not intractable: Secondary | ICD-10-CM | POA: Diagnosis not present

## 2018-09-21 DIAGNOSIS — E1143 Type 2 diabetes mellitus with diabetic autonomic (poly)neuropathy: Secondary | ICD-10-CM

## 2018-09-21 DIAGNOSIS — E118 Type 2 diabetes mellitus with unspecified complications: Secondary | ICD-10-CM | POA: Diagnosis not present

## 2018-09-21 DIAGNOSIS — F17200 Nicotine dependence, unspecified, uncomplicated: Secondary | ICD-10-CM

## 2018-09-21 DIAGNOSIS — Z23 Encounter for immunization: Secondary | ICD-10-CM

## 2018-09-21 DIAGNOSIS — Z794 Long term (current) use of insulin: Secondary | ICD-10-CM | POA: Diagnosis not present

## 2018-09-21 DIAGNOSIS — Z1211 Encounter for screening for malignant neoplasm of colon: Secondary | ICD-10-CM

## 2018-09-21 DIAGNOSIS — K3184 Gastroparesis: Secondary | ICD-10-CM

## 2018-09-21 LAB — POCT GLYCOSYLATED HEMOGLOBIN (HGB A1C): HbA1c, POC (controlled diabetic range): 7.8 % — AB (ref 0.0–7.0)

## 2018-09-21 MED ORDER — LORAZEPAM 1 MG PO TABS: 1.0000 mg | ORAL_TABLET | Freq: Four times a day (QID) | ORAL | 5 refills | Status: DC | PRN

## 2018-09-21 MED ORDER — GLUCOSE BLOOD VI STRP: ORAL_STRIP | 12 refills | Status: DC

## 2018-09-21 MED ORDER — OXYCODONE HCL 5 MG PO TABS: 5.0000 mg | ORAL_TABLET | ORAL | 0 refills | Status: DC | PRN

## 2018-09-21 MED ORDER — NICOTINE POLACRILEX 4 MG MT LOZG: 4.0000 mg | LOZENGE | OROMUCOSAL | 1 refills | Status: DC | PRN

## 2018-09-21 MED ORDER — PROMETHAZINE HCL 12.5 MG PO TABS: 12.5000 mg | ORAL_TABLET | Freq: Four times a day (QID) | ORAL | 3 refills | Status: DC | PRN

## 2018-09-21 NOTE — Patient Instructions (Addendum)
Your blood sugar is not quite where it needs to be.  Hemoglobin A1c 7.8% is not bad, but you were better last year at 6.8%.    I would like you to get the glucometer strips then check your blood sugars twice a day.  Some in the morning before eating, some before lunch, some before supper.   We will work on your blood sugar control on your next office visit in a month.  Call 1-800- QUIT-NOW for free assistance with smoking cessation, including counseling.   We are setting you up for an eye doctor's appointment, and gastroenterologist for colon cancer screening and your stomach pain with vomiting complaint.

## 2018-09-24 ENCOUNTER — Encounter: Payer: Self-pay | Admitting: Family Medicine

## 2018-09-24 NOTE — Assessment & Plan Note (Signed)
Established problem that has improved.    

## 2018-09-24 NOTE — Assessment & Plan Note (Addendum)
Lab Results  Component Value Date   HGBA1C 7.8 (A) 09/21/2018   Established problem worsened.  Pt is to return to using basaglar as that is what is covered by her insurance.  Stop Lantus that was started at pt's request at dc from hospital.  She is reluctant to start prandial insulin. Dulaglutide would be an option except fo common ADE of GI upset. A DPP-4 inhibitor or SGLT-2 may be better tolerated.   Plan: return to Basaglar to 18 units daily.

## 2018-09-24 NOTE — Assessment & Plan Note (Signed)
Established problem. Stable since hospitalization in August Continue current rescue thrapy

## 2018-09-24 NOTE — Progress Notes (Signed)
Subjective:    Patient ID: [name] [name], female    DOB: 03-May-1966, 52 y.o.   MRN: [id number] MADOLINE BHATT is alone Sources of clinical information for visit is/are patient and past medical records. Nursing assessment for this office visit was reviewed with the patient for accuracy and revision.  Previous Report(s) Reviewed: historical medical records and DC summary    Depression screen PHQ 2/9 07/28/2018  Decreased Interest 0  Down, Depressed, Hopeless 0  PHQ - 2 Score 0  Altered sleeping -  Tired, decreased energy -  Change in appetite -  Feeling bad or failure about yourself  -  Trouble concentrating -  Moving slowly or fidgety/restless -  Suicidal thoughts -  PHQ-9 Score -  Difficult doing work/chores -  Some recent data might be hidden   Fall Risk  07/28/2018 01/05/2018 12/09/2017 09/28/2017 08/06/2016  Falls in the past year? No No No No No  Number falls in past yr: - - - - -  Risk for fall due to : - - - - (No Data)  Risk for fall due to: Comment - - - - No Fall Rsisks identified today   Follow up outpatient visit after Hospitalization  the Discharge Summary for the hospitalization from 08/11/18 to 08/12/18 to was reviewed.  Nursing assessment for this office visit was reviewed with the patient for accuracy and revision.   HPI Principle Diagnosis requiring hospiatlization: exacerbation of diabetic gastroparesis  52 year old female with a history of diabetic gastroparesis was admitted for intractable vomiting. Emesis was controlled in ED by phenergan, haldol and ativan. Patient was additionally given 2 x 1 L IVF boluses and started on mIVF. She was noted to be hyperglycemic which was likely because she held her insulin when she was unable to keep down food all day. She was started on a low dose insulin to help control blood sugars >400 and then home insulin was restarted once she was tolerating a diet. She was started on clears and diet was advanced as she tolerated. She  was tolerating renal diet at discharge. Patient prefers to use lantus but informed her that her insurance may not cover this. She is to continue her home dose of the basaglar if this is too expensive.   Follow up instructions from patient's hospital healthcare providers:  1. Follow up patient's sugars on lantus, follow up if patient able to get lantus 2. Review patient's nausea and ability to keep food down 3. Patient interested in having port removed ---------------------------------------------------------------------------------------------------------------------- Problems since hospital discharge:  Run out of glucomter strips.  No record of CBGs  Vomiting History elements: Onset / Duration: 6 months ago Quality: acute onste out of blue epigastric discomfort for seconds with projectile vomiting of yellow fluid or food if she had eaten recently Feels better after vomiting.  Unrelated to food.   Course: intermittent, may occur only once a month or at times once a week Treatment prior to visit: none Modifying Factors: nothing Associated symptoms: no stool issue, no melena, no BRBPR, no hematemesis      ---------------------------------------------------------------------------------------------------------------------- Follow up appointments with specialists:  Pending: GI for screening colonoscopy Pending: Ophth consult for diabetic eye exam ---------------------------------------------------------------------------------------------------------------------- New medications started during hospitalization: none Chronic medications stopped during hospitalization: none Patient's Medication List was updated in the EMR: yes --------------------------------------------------------------------------------------------------------------------- Home Health Services: none Durable Medical Equipment:  none --------------------------------------------------------------------------------------------------------------------- ADLs Independent Needs Assistance Dependent  Bathing x    Dressing x    Ambulation x  Toileting x    Eating x     IADL Independent Needs Assistance Dependent  Cooking x    Housework x    Manage Medications x    Manage the telephone x    Shopping for food, clothes, Meds, etc x    Use transportation x    Manage Finances x      CHRONIC DIABETES  Disease Monitoring  Blood Sugar Ranges: ran out of strips  Polyuria: no   Visual problems: no   Medication Compliance: yes, basaglar  Medication Side Effects  Hypoglycemia: no         HPI    Review of Systems     Objective:   Physical Exam        Assessment & Plan:

## 2018-09-25 ENCOUNTER — Telehealth: Payer: Self-pay
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
## 2018-09-28 ENCOUNTER — Encounter: Payer: Self-pay | Admitting: Internal Medicine

## 2018-10-16 ENCOUNTER — Telehealth: Payer: Self-pay | Admitting: Family Medicine

## 2018-10-16 ENCOUNTER — Encounter: Payer: Self-pay | Admitting: Family Medicine

## 2018-10-16 DIAGNOSIS — F4321 Adjustment disorder with depressed mood: Secondary | ICD-10-CM | POA: Insufficient documentation

## 2018-10-16 DIAGNOSIS — F4320 Adjustment disorder, unspecified: Secondary | ICD-10-CM

## 2018-10-16 HISTORY — DX: Adjustment disorder with depressed mood: F43.21

## 2018-10-16 HISTORY — DX: Adjustment disorder, unspecified: F43.20

## 2018-10-16 MED ORDER — ZOLPIDEM TARTRATE 5 MG PO TABS: 5.0000 mg | ORAL_TABLET | Freq: Every evening | ORAL | 0 refills | Status: DC | PRN

## 2018-10-16 NOTE — Telephone Encounter (Signed)
Pt is calling and would like to have something for her nerves. Her son was killed last night. Please call patient I also transferred the call to [name]. [name] [name]

## 2018-10-16 NOTE — Telephone Encounter (Signed)
Grief reaction Insomnia with DFA Rx Zolpidem 5 mg qhs prn #15 RF 0

## 2018-10-17 NOTE — Progress Notes (Signed)
Service : [place] F/U Call  LCSW received message from [facility] intern that patient called yesterday upset and crying, her son was killed. LCSW return phone called.  Patient crying and upset. Has family support with siblings and children during this difficult time.   Intervention: Supportive Counseling, Reflective listening, emotional suppoert. Patient declined resources at this time. appreciative of phone call and support  Plan: Patient will call to schedule appointment later if needed with [facility].  [name], [place]   [phone] 9:49 AM

## 2018-10-23 ENCOUNTER — Telehealth: Payer: Self-pay | Admitting: Psychology

## 2018-10-23 NOTE — Telephone Encounter (Signed)
[facility] intern received a call from patient on 10/16/18. She called to schedule an integrated care appointment with [name], as her son died a few days prior to the call. She was tearful over the phone and [facility] intern offered brief emotional support. [facility] intern informed patient that she would notify [name] to call the patient to schedule a follow-up appointment.

## 2018-10-25 ENCOUNTER — Encounter (INDEPENDENT_AMBULATORY_CARE_PROVIDER_SITE_OTHER): Payer: Medicare Other | Admitting: Ophthalmology

## 2018-10-27 ENCOUNTER — Other Ambulatory Visit: Payer: Self-pay | Admitting: Family Medicine

## 2018-10-27 DIAGNOSIS — E118 Type 2 diabetes mellitus with unspecified complications: Secondary | ICD-10-CM

## 2018-10-27 DIAGNOSIS — I10 Essential (primary) hypertension: Secondary | ICD-10-CM

## 2018-10-27 DIAGNOSIS — Z794 Long term (current) use of insulin: Principal | ICD-10-CM

## 2018-10-31 ENCOUNTER — Ambulatory Visit (AMBULATORY_SURGERY_CENTER): Payer: 59

## 2018-10-31 VITALS — Ht 61.5 in | Wt 144.6 lb

## 2018-10-31 DIAGNOSIS — Z1211 Encounter for screening for malignant neoplasm of colon: Secondary | ICD-10-CM

## 2018-10-31 MED ORDER — NA SULFATE-K SULFATE-MG SULF 17.5-3.13-1.6 GM/177ML PO SOLN: 1.0000 | Freq: Once | ORAL | 0 refills | Status: AC

## 2018-10-31 NOTE — Progress Notes (Signed)
Denies allergies to eggs or soy products. Denies complication of anesthesia or sedation. Denies use of weight loss medication. Denies use of O2.   Emmi instructions declined.  

## 2018-11-03 ENCOUNTER — Other Ambulatory Visit: Payer: Self-pay | Admitting: Family Medicine

## 2018-11-03 DIAGNOSIS — K3184 Gastroparesis: Secondary | ICD-10-CM

## 2018-11-03 DIAGNOSIS — R1115 Cyclical vomiting syndrome unrelated to migraine: Secondary | ICD-10-CM

## 2018-11-03 DIAGNOSIS — E1143 Type 2 diabetes mellitus with diabetic autonomic (poly)neuropathy: Secondary | ICD-10-CM

## 2018-11-03 NOTE — Telephone Encounter (Signed)
Pt would like to have her Oxycodone refilled.

## 2018-11-05 MED ORDER — OXYCODONE HCL 5 MG PO TABS: 5.0000 mg | ORAL_TABLET | ORAL | 0 refills | Status: DC | PRN

## 2018-11-13 ENCOUNTER — Telehealth: Payer: Self-pay | Admitting: Internal Medicine

## 2018-11-13 NOTE — Telephone Encounter (Signed)
Returned patients call. No answer. Left message to call us back.   Riki Sheer, LPN ( Admitting )

## 2018-11-14 ENCOUNTER — Ambulatory Visit (AMBULATORY_SURGERY_CENTER): Payer: 59 | Admitting: Internal Medicine

## 2018-11-14 ENCOUNTER — Encounter: Payer: Self-pay | Admitting: Internal Medicine

## 2018-11-14 VITALS — BP 145/68 | HR 51 | Temp 96.0°F | Resp 16 | Ht 62.0 in | Wt 143.0 lb

## 2018-11-14 DIAGNOSIS — D123 Benign neoplasm of transverse colon: Secondary | ICD-10-CM

## 2018-11-14 DIAGNOSIS — Z1211 Encounter for screening for malignant neoplasm of colon: Secondary | ICD-10-CM | POA: Diagnosis not present

## 2018-11-14 DIAGNOSIS — D120 Benign neoplasm of cecum: Secondary | ICD-10-CM

## 2018-11-14 DIAGNOSIS — D125 Benign neoplasm of sigmoid colon: Secondary | ICD-10-CM

## 2018-11-14 DIAGNOSIS — D122 Benign neoplasm of ascending colon: Secondary | ICD-10-CM

## 2018-11-14 MED ORDER — SODIUM CHLORIDE 0.9 % IV SOLN: 500.0000 mL | Freq: Once | INTRAVENOUS | Status: DC

## 2018-11-14 NOTE — Progress Notes (Signed)
PT taken to PACU. Monitors in place. VSS. Report given to RN. 

## 2018-11-14 NOTE — Patient Instructions (Signed)
Handouts provided:  Polyps  YOU HAD AN ENDOSCOPIC PROCEDURE TODAY AT THE [facility]:   Refer to the procedure report that was given to you for any specific questions about what was found during the examination.  If the procedure report does not answer your questions, please call your gastroenterologist to clarify.  If you requested that your care partner not be given the details of your procedure findings, then the procedure report has been included in a sealed envelope for you to review at your convenience later.  YOU SHOULD EXPECT: Some feelings of bloating in the abdomen. Passage of more gas than usual.  Walking can help get rid of the air that was put into your GI tract during the procedure and reduce the bloating. If you had a lower endoscopy (such as a colonoscopy or flexible sigmoidoscopy) you may notice spotting of blood in your stool or on the toilet paper. If you underwent a bowel prep for your procedure, you may not have a normal bowel movement for a few days.  Please Note:  You might notice some irritation and congestion in your nose or some drainage.  This is from the oxygen used during your procedure.  There is no need for concern and it should clear up in a day or so.  SYMPTOMS TO REPORT IMMEDIATELY:   Following lower endoscopy (colonoscopy or flexible sigmoidoscopy):  Excessive amounts of blood in the stool  Significant tenderness or worsening of abdominal pains  Swelling of the abdomen that is new, acute  Fever of 100F or higher  For urgent or emergent issues, a gastroenterologist can be reached at any hour by calling [phone].   DIET:  We do recommend a small meal at first, but then you may proceed to your regular diet.  Drink plenty of fluids but you should avoid alcoholic beverages for 24 hours.  ACTIVITY:  You should plan to take it easy for the rest of today and you should NOT DRIVE or use heavy machinery until tomorrow (because of the sedation  medicines used during the test).    FOLLOW UP: Our staff will call the number listed on your records the next business day following your procedure to check on you and address any questions or concerns that you may have regarding the information given to you following your procedure. If we do not reach you, we will leave a message.  However, if you are feeling well and you are not experiencing any problems, there is no need to return our call.  We will assume that you have returned to your regular daily activities without incident.  If any biopsies were taken you will be contacted by phone or by letter within the next 1-3 weeks.  Please call us at [phone] if you have not heard about the biopsies in 3 weeks.    SIGNATURES/CONFIDENTIALITY: You and/or your care partner have signed paperwork which will be entered into your electronic medical record.  These signatures attest to the fact that that the information above on your After Visit Summary has been reviewed and is understood.  Full responsibility of the confidentiality of this discharge information lies with you and/or your care-partner.  

## 2018-11-14 NOTE — Progress Notes (Signed)
Called to room to assist during endoscopic procedure.  Patient ID and intended procedure confirmed with present staff. Received instructions for my participation in the procedure from the performing physician.  

## 2018-11-14 NOTE — Op Note (Addendum)
[facility] Patient Name: [name] [name] Procedure Date: 11/14/2018 9:55 AM MRN: [id number] Endoscopist: Docia Chuck. Henrene Pastor , MD Age: 52 Referring MD:  Date of Birth: [birth year] Gender: Female Account #: [id number] Procedure:                Colonoscopy with cold snare polypectomy x 7 Indications:              Screening for colorectal malignant neoplasm Medicines:                Monitored Anesthesia Care Procedure:                Pre-Anesthesia Assessment:                           - Prior to the procedure, a History and Physical                            was performed, and patient medications and                            allergies were reviewed. The patient's tolerance of                            previous anesthesia was also reviewed. The risks                            and benefits of the procedure and the sedation                            options and risks were discussed with the patient.                            All questions were answered, and informed consent                            was obtained. Prior Anticoagulants: The patient has                            taken no previous anticoagulant or antiplatelet                            agents. ASA Grade Assessment: II - A patient with                            mild systemic disease. After reviewing the risks                            and benefits, the patient was deemed in                            satisfactory condition to undergo the procedure.                           After obtaining informed consent, the colonoscope  was passed under direct vision. Throughout the                            procedure, the patient's blood pressure, pulse, and                            oxygen saturations were monitored continuously. The                            Colonoscope was introduced through the anus and                            advanced to the the cecum, identified by       appendiceal orifice and ileocecal valve. The                            ileocecal valve, appendiceal orifice, and rectum                            were photographed. The quality of the bowel                            preparation was fair. The colonoscopy was performed                            without difficulty. The patient tolerated the                            procedure well. The bowel preparation used was                            SUPREP. Scope In: 9:57:01 AM Scope Out: 10:18:42 AM Scope Withdrawal Time: 0 hours 15 minutes 38 seconds  Total Procedure Duration: 0 hours 21 minutes 41 seconds  Findings:                 Seven polyps were found in the sigmoid colon,                            transverse colon, ascending colon and cecum. The                            polyps were 2 to 10 mm in size. These polyps were                            removed with a cold snare. Resection and retrieval                            were complete.                           The exam was otherwise without abnormality on                            direct  and retroflexion views. Complications:            No immediate complications. Estimated blood loss:                            None. Estimated Blood Loss:     Estimated blood loss: none. Impression:               - Preparation of the colon was fair.                           - Seven 2 to 10 mm polyps in the sigmoid colon, in                            the transverse colon, in the ascending colon and in                            the cecum, removed with a cold snare. Resected and                            retrieved.                           - The examination was otherwise normal on direct                            and retroflexion views. Recommendation:           - Repeat colonoscopy in 1 year for surveillance                            MORE EXTENSIVE PREP. Recommend magnesium citrate                            the morning before followed by  standard split prep.                           - Patient has a contact number available for                            emergencies. The signs and symptoms of potential                            delayed complications were discussed with the                            patient. Return to normal activities tomorrow.                            Written discharge instructions were provided to the                            patient.                           - Resume previous diet.                           -  Continue present medications.                           - Await pathology results. Docia Chuck. Henrene Pastor, MD 11/14/2018 10:28:03 AM This report has been signed electronically.

## 2018-11-15 ENCOUNTER — Encounter: Payer: Self-pay | Admitting: Family Medicine

## 2018-11-15 ENCOUNTER — Telehealth: Payer: Self-pay | Admitting: *Deleted

## 2018-11-15 DIAGNOSIS — Z860100 Personal history of colon polyps, unspecified: Secondary | ICD-10-CM | POA: Insufficient documentation

## 2018-11-15 HISTORY — DX: Personal history of colon polyps, unspecified: Z86.0100

## 2018-11-15 HISTORY — DX: Personal history of colonic polyps: Z86.010

## 2018-11-15 NOTE — Telephone Encounter (Signed)
  Follow up Call-  Call back number 11/14/2018  Post procedure Call Back phone  # [phone]  Permission to leave phone message Yes  Some recent data might be hidden     Patient questions:  Do you have a fever, pain , or abdominal swelling? No. Pain Score  0 *  Have you tolerated food without any problems? Yes.    Have you been able to return to your normal activities? Yes.    Do you have any questions about your discharge instructions: Diet   No. Medications  No. Follow up visit  No.  Do you have questions or concerns about your Care? No.  Actions: * If pain score is 4 or above: No action needed, pain <4.  **Patient stated she had some pressure in her rectum when driving home going over bumps and sometimes when she set down on a hard surface.  She stated its better today and I told her to call us back if it doesn't continue to improve.  No further questions.

## 2018-11-16 ENCOUNTER — Other Ambulatory Visit: Payer: Self-pay

## 2018-11-16 ENCOUNTER — Ambulatory Visit (INDEPENDENT_AMBULATORY_CARE_PROVIDER_SITE_OTHER): Payer: 59 | Admitting: Family Medicine

## 2018-11-16 ENCOUNTER — Ambulatory Visit (INDEPENDENT_AMBULATORY_CARE_PROVIDER_SITE_OTHER): Payer: 59 | Admitting: Licensed Clinical Social Worker

## 2018-11-16 ENCOUNTER — Encounter: Payer: Self-pay | Admitting: Family Medicine

## 2018-11-16 VITALS — BP 150/60 | HR 52 | Temp 98.1°F | Ht 62.0 in | Wt 149.4 lb

## 2018-11-16 DIAGNOSIS — F39 Unspecified mood [affective] disorder: Secondary | ICD-10-CM

## 2018-11-16 DIAGNOSIS — F4321 Adjustment disorder with depressed mood: Secondary | ICD-10-CM | POA: Diagnosis not present

## 2018-11-16 DIAGNOSIS — E118 Type 2 diabetes mellitus with unspecified complications: Secondary | ICD-10-CM

## 2018-11-16 DIAGNOSIS — F331 Major depressive disorder, recurrent, moderate: Secondary | ICD-10-CM

## 2018-11-16 DIAGNOSIS — F4311 Post-traumatic stress disorder, acute: Secondary | ICD-10-CM

## 2018-11-16 LAB — POCT GLYCOSYLATED HEMOGLOBIN (HGB A1C): HBA1C, POC (CONTROLLED DIABETIC RANGE): 9.2 % — AB (ref 0.0–7.0)

## 2018-11-16 MED ORDER — MIRTAZAPINE 15 MG PO TABS: 15.0000 mg | ORAL_TABLET | Freq: Every day | ORAL | 2 refills | Status: DC

## 2018-11-16 MED ORDER — TEMAZEPAM 15 MG PO CAPS: 15.0000 mg | ORAL_CAPSULE | Freq: Every evening | ORAL | 0 refills | Status: DC | PRN

## 2018-11-16 NOTE — Patient Instructions (Addendum)
Please Start taking mirtazapine 15 mg tablet, one tablet at bedtime.  Dr McDiarmid prescribed temazapam (like valium) 15 mg tablet, One table at bedtime as needed to help with sleep.  There is enough tablets for 3 weeks of treatment if it is taken every day.    Dr McDiarmid would like to see you back in two to three weeks to see how you are doing.   We will go over your A1c results at your next office visit.  We will work on rescheduling you for your eye doctor  examination.
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
## 2018-11-17 ENCOUNTER — Encounter: Payer: Self-pay | Admitting: Internal Medicine

## 2018-11-17 ENCOUNTER — Encounter: Payer: Self-pay | Admitting: Family Medicine

## 2018-11-17 DIAGNOSIS — F4311 Post-traumatic stress disorder, acute: Secondary | ICD-10-CM

## 2018-11-17 HISTORY — DX: Post-traumatic stress disorder, acute: F43.11

## 2018-11-17 NOTE — Assessment & Plan Note (Signed)
Established problem Insomnia of DFA and EMA with DFBA Ambient did not help with EMA with DFBA  Start Restoril 15 mg qhs prn sleep #21. No refills.  I discussed with [name] [name] that this benzo prescription is for only one fill.  Further refills are unlikely as this could interfere with her coping with her losses and stressors.   RTC 2 weeks to follow up MDD and ATSD
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
## 2018-11-17 NOTE — Assessment & Plan Note (Signed)
Recurrence, possibly complicated by acute traumatic stress disorder/ PTSD Trail mirtazapine as stress of discussing past traumas have resulted with exacerbation of her cyclic vomiting.
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
## 2018-11-17 NOTE — Assessment & Plan Note (Signed)
Established problem Uncontrolled Lab Results  Component Value Date   HGBA1C 9.2 (A) 11/16/2018  Up from 7.8% 09/24/18. Recommend [name] [name] increase her basaglar to at least 12 unit daily, and if start eating better to return to her usual 18 unit each morning.  RTC 2 weeks with daily fasting CBGs.

## 2018-11-21 ENCOUNTER — Telehealth: Payer: Self-pay | Admitting: Licensed Clinical Social Worker

## 2018-11-21 NOTE — Progress Notes (Signed)
Service : [place] F/U Call   F/U call to patient. Informed patient LCSW put grief counseling resources in the mail.  Patient will try to spend time with family during this difficult Holiday without her son.  Will call LCSW with questions once she receives information. Appreciative of F/U call.  [name], [place]   [phone] 3:01 PM

## 2018-12-13 ENCOUNTER — Other Ambulatory Visit: Payer: Self-pay | Admitting: Family Medicine

## 2018-12-13 DIAGNOSIS — K3184 Gastroparesis: Secondary | ICD-10-CM

## 2018-12-13 DIAGNOSIS — E1143 Type 2 diabetes mellitus with diabetic autonomic (poly)neuropathy: Secondary | ICD-10-CM

## 2018-12-13 DIAGNOSIS — R1115 Cyclical vomiting syndrome unrelated to migraine: Secondary | ICD-10-CM

## 2018-12-13 MED ORDER — OXYCODONE HCL 5 MG PO TABS: 5.0000 mg | ORAL_TABLET | ORAL | 0 refills | Status: DC | PRN

## 2018-12-13 NOTE — Telephone Encounter (Signed)
Pt requesting refill on her oxycodone. Please advise

## 2018-12-28 ENCOUNTER — Ambulatory Visit (INDEPENDENT_AMBULATORY_CARE_PROVIDER_SITE_OTHER): Payer: 59 | Admitting: Family Medicine

## 2018-12-28 ENCOUNTER — Other Ambulatory Visit: Payer: Self-pay

## 2018-12-28 ENCOUNTER — Encounter: Payer: Self-pay | Admitting: Family Medicine

## 2018-12-28 VITALS — BP 124/70 | Temp 98.2°F | Ht 62.0 in | Wt 148.0 lb

## 2018-12-28 DIAGNOSIS — K3184 Gastroparesis: Secondary | ICD-10-CM

## 2018-12-28 DIAGNOSIS — E1143 Type 2 diabetes mellitus with diabetic autonomic (poly)neuropathy: Secondary | ICD-10-CM | POA: Diagnosis not present

## 2018-12-28 DIAGNOSIS — Z5181 Encounter for therapeutic drug level monitoring: Secondary | ICD-10-CM

## 2018-12-28 DIAGNOSIS — F330 Major depressive disorder, recurrent, mild: Secondary | ICD-10-CM

## 2018-12-28 DIAGNOSIS — E1142 Type 2 diabetes mellitus with diabetic polyneuropathy: Secondary | ICD-10-CM | POA: Diagnosis not present

## 2018-12-28 DIAGNOSIS — E118 Type 2 diabetes mellitus with unspecified complications: Secondary | ICD-10-CM

## 2018-12-28 DIAGNOSIS — Z79891 Long term (current) use of opiate analgesic: Secondary | ICD-10-CM

## 2018-12-28 DIAGNOSIS — R1115 Cyclical vomiting syndrome unrelated to migraine: Secondary | ICD-10-CM | POA: Diagnosis not present

## 2018-12-28 DIAGNOSIS — F4321 Adjustment disorder with depressed mood: Secondary | ICD-10-CM

## 2018-12-28 DIAGNOSIS — F5104 Psychophysiologic insomnia: Secondary | ICD-10-CM

## 2018-12-28 DIAGNOSIS — N184 Chronic kidney disease, stage 4 (severe): Secondary | ICD-10-CM

## 2018-12-28 MED ORDER — LORAZEPAM 1 MG PO TABS: 1.0000 mg | ORAL_TABLET | Freq: Four times a day (QID) | ORAL | 5 refills | Status: DC | PRN

## 2018-12-28 MED ORDER — GLUCOSE BLOOD VI STRP: ORAL_STRIP | 12 refills | Status: DC

## 2018-12-28 MED ORDER — OXYCODONE HCL 5 MG PO TABS: 5.0000 mg | ORAL_TABLET | ORAL | 0 refills | Status: DC | PRN

## 2018-12-28 MED ORDER — INSULIN GLARGINE 100 UNIT/ML SOLOSTAR PEN: 20.0000 [IU] | PEN_INJECTOR | Freq: Every day | SUBCUTANEOUS | 99 refills | Status: DC

## 2018-12-28 NOTE — Patient Instructions (Signed)
We have switched back to Lantus, 20 units every day.   Use the Lorazepam (Ativan) for nights you are unable to sleep well for a couple nights You should have blood sugar strips awailable for you at the pharmacy.  Dr Ravindra Baranek agrees with you stopping the mirtazapine,  This may be the medication that was causing bloating.

## 2018-12-29 ENCOUNTER — Encounter: Payer: Self-pay | Admitting: Family Medicine
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
## 2018-12-29 NOTE — Progress Notes (Signed)
Subjective:    Patient ID: [name] [name], female    DOB: 09-Jan-1966, 53 y.o.   MRN: [id number] [name] [name] is alone Sources of clinical information for visit is/are patient and past medical records. Nursing assessment for this office visit was reviewed with the patient for accuracy and revision.   Previous Report(s) Reviewed: historical medical records  Depression screen [facility] 2/9 07/28/2018  Decreased Interest 0  Down, Depressed, Hopeless 0  PHQ - 2 Score 0  Altered sleeping -  Tired, decreased energy -  Change in appetite -  Feeling bad or failure about yourself  -  Trouble concentrating -  Moving slowly or fidgety/restless -  Suicidal thoughts -  PHQ-9 Score -  Difficult doing work/chores -  Some recent data might be hidden   Fall Risk  11/16/2018 07/28/2018 01/05/2018 12/09/2017 09/28/2017  Falls in the past year? 0 No No No No  Number falls in past yr: 0 - - - -  Injury with Fall? 0 - - - -  Risk for fall due to : - - - - -  Risk for fall due to: Comment - - - - -    History/P.E. limitations: none  Adult vaccines due  Topic Date Due  . TETANUS/TDAP  09/29/2023  . PNEUMOCOCCAL POLYSACCHARIDE VACCINE AGE 35-64 HIGH RISK  Completed    Diabetes Health Maintenance Due  Topic Date Due  . OPHTHALMOLOGY EXAM  07/08/2017  . FOOT EXAM  05/26/2018  . HEMOGLOBIN A1C  05/17/2019    Health Maintenance Due  Topic Date Due  . OPHTHALMOLOGY EXAM  07/08/2017  . FOOT EXAM  05/26/2018     HPI   CHRONIC HYPERTENSION  Disease Monitoring  Blood pressure range: does not have bp cuff  Chest pain: no   Dyspnea: no   Claudication: no   Medication compliance: yes  Medication Side Effects  Lightheadedness: no   Urinary frequency: no   Edema: no    No shaken salt in diet.     CHRONIC DIABETES  Disease Monitoring  Blood Sugar Ranges: did not get right glucose strips for her glucometer.  Has not been checking CBGs  Polyuria: yes   Visual problems: no   Medication  Compliance: yes, basaglar 18 units daily,   Medication Side Effects  Hypoglycemia: yes,    Preventitive Health Care            Statin: no             Dental evaluation in 61-month:no             Recent eGFR: 25 - 28 mL/min (08/12/18)              ACEI: No  Eye Exam: No  Foot Exam: No  Diet pattern: fair, high CH2O  Exercise: no  DEPRESSION Disease Monitoring Current symptoms include depressed mood            Symptoms have been gradually improving     Is Exercising no  Evidence of suicidal ideation: no  Medication Monitoring Compliance: not currently on medications for this problem Insomnia Chronic with acute worsening with death of son  GI symptoms bloating SH: resumed working at [employer] two weeks ago ROS - See HPI No racing thoughts, no thoughts of self harm nor harming others,   PMH Previous treatment includes: medication Mirtazapine which pt stopped a week ago bc she felt it was causing abdominal bloating.  Bloating sensation has stopped.  Insomnia Onset: first noted in [place] in 2008 Sleep pattern over last 24 hours: able to maintain sleep with use temazepam last night Frequency of Sleep Difficulty per week: nightly Pattern of Sleep Difficulty (intermittent or constant): constant  Sleep-related impariments: (Daytime function: no;  Mood: yes; Fatigue: yes; Daytime Sleepiness: no; Errors or Accidents: no; Morning headaches: no;  Persistent worry about sleep: yes) Daily Exercise: none Daily Sunlight exposure: low  SH: Smoking THC 2 times week.  She reports the [facility] helps with her anxiety and nausea.   Review of Systems See hpi    Objective:   Physical Exam VS reviewed GEN: Alert, Cooperative, Groomed, NAD Cor: RRR, no murmur Ext: no edema Gait: Normal speed, No significant path deviation, Step through +,  Psych: Normal affect/thought/speech/language    Diabetic Foot Exam - Simple   Simple Foot Form Diabetic Foot exam was performed with the  following findings:  Yes 12/28/2018  9:53 AM  Visual Inspection No deformities, no ulcerations, no other skin breakdown bilaterally:  Yes See comments:  Yes Sensation Testing Intact to touch and monofilament testing bilaterally:  Yes Pulse Check Posterior Tibialis and Dorsalis pulse intact bilaterally:  Yes Comments Thick nails x 4       Lab Results  Component Value Date   HGBA1C 9.2 (A) 11/16/2018    Assessment & Plan:  Visit Problem List with A/P  Chronic insomnia secondary to General Medical and Mood disorders Established problem that has improved.  Poor sleep hygiene that patient does not appear to be able to be motivated to change at this time.  Some current contribution for relatively recent murder of her son with PTSD-like experiences related to her experiences related to other murdered relatives in her past.   Discussed Sleep hygiene.  Will continue to explore small behavioral  changes on subsequent visits Patient given 5 addition Lorazepam a month added to her cyclic vomiting self-care lorazepam (now a total of 35 lorazepam tablets a month) to use at her discretion for sleep should she go two or more long sleep periods without refreshing sleep.   Grief reaction Improved.  She has started back to work.  Ms [name] [name] has not sought counseling thought she knows how to access it.  Component of PTSD given murder of son along with prior murder of other relatives in her past.  She feels supported by close realtives. Will monitor  Cyclic vomiting syndrome No episodes requiring ED visits for IV management Has had multiple short episodes at home that she was able to abort with her combination of lorazepam, oxycodone, and promethazine.  Continue current abortive therapy.  Ms [name] [name] knows to go to ED if unable to abort n/v episode with oral/suppository medications.   Chronic kidney disease (CKD) stage G4/A2, severely decreased glomerular filtration rate (GFR) between 15-29  mL/min/1.73 square meter and albuminuria creatinine ratio between 30-299 mg/g [facility]) Encouraged patient to follow up with Dr [name] [name] (Nephro).   Pt missed last follow up consult with Dr Joelyn Oms Will make reconsult to Dr Joelyn Oms [facility] Latest Ref Rng & Units 08/12/2018 08/11/2018 08/11/2018  Glucose 70 - 99 mg/dL 273(H) 431(H) 400(H)  BUN 6 - 20 mg/dL 37(H) 39(H) 35(H)  Creatinine 0.44 - 1.00 mg/dL 2.43(H) 2.98(H) 2.67(H)  BUN/Creat Ratio 9 - 23 - - -  Sodium 135 - 145 mmol/L 142 142 141  Potassium 3.5 - 5.1 mmol/L 3.7 3.8 4.1  Chloride 98 - 111 mmol/L 109 106 106  CO2 22 - 32 mmol/L 22 19(L) 19(L)  Calcium 8.9 - 10.3 mg/dL 9.2 10.2 10.6(H)      DM (diabetes mellitus), type 2 with complications ([place]) Established problem Uncontrolled glycemia Ms Bezek is pleased that with change in her Medicare Part C provider she will be able to get Lantus instead of Basaglar as her insulin glargine. She will start at 1:1 Lantus to Basaglar dosing, 20 units daily.  She has glucose tablets with her at all times.  She is able to tell when her blood glucose is getting too low.   Ms Covino may be a good candidate for liraglutide GLP1i therapy in future. Goal Is A1c less than 7.5%, stretch <7.0% Pt not on ACEi bc of CKD4.  Need to start Statin next visit.  Check Lipid panel next visit.   I advised Ms Fodge that she could stop her daily baby aspirin She needs a diabetic eye exam Urine protein/creatinine next visit    Recurrent major depressive disorder ([place]) Established problem that has improved.  Patient self-stopped mirtazapine bc of feelings of bloating.  Patinet may have component of ATSD/PTSD

## 2018-12-29 NOTE — Assessment & Plan Note (Signed)
Established problem that has improved.  Patient self-stopped mirtazapine bc of feelings of bloating.  Patinet may have component of ATSD/PTSD

## 2018-12-29 NOTE — Assessment & Plan Note (Signed)
Established problem Uncontrolled glycemia [name] [name] is pleased that with change in her Medicare Part C provider she will be able to get Lantus instead of Basaglar as her insulin glargine. She will start at 1:1 Lantus to Basaglar dosing, 20 units daily.  She has glucose tablets with her at all times.  She is able to tell when her blood glucose is getting too low.   [name] [name] may be a good candidate for liraglutide GLP1i therapy in future. Goal Is A1c less than 7.5%, stretch <7.0% Pt not on ACEi bc of CKD4.  Need to start Statin next visit.  Check Lipid panel next visit.   I advised [name] [name] that she could stop her daily baby aspirin She needs a diabetic eye exam Urine protein/creatinine next visit

## 2018-12-29 NOTE — Assessment & Plan Note (Addendum)
No episodes requiring ED visits for IV management Has had multiple short episodes at home that she was able to abort with her combination of lorazepam, oxycodone, and promethazine.  Continue current abortive therapy.  Ms Ryther knows to go to ED if unable to abort n/v episode with oral/suppository medications.

## 2018-12-29 NOTE — Assessment & Plan Note (Addendum)
Improved.  She has started back to work.  [name] [name] has not sought counseling thought she knows how to access it.  Component of PTSD given murder of son along with prior murder of other relatives in her past.  She feels supported by close realtives. Will monitor

## 2018-12-29 NOTE — Assessment & Plan Note (Addendum)
Encouraged patient to follow up with Dr Baird Cancer (Nephro).   Pt missed last follow up consult with Dr Joelyn Oms Will make reconsult to Dr Joelyn Oms [facility] Latest Ref Rng & Units 08/12/2018 08/11/2018 08/11/2018  Glucose 70 - 99 mg/dL 273(H) 431(H) 400(H)  BUN 6 - 20 mg/dL 37(H) 39(H) 35(H)  Creatinine 0.44 - 1.00 mg/dL 2.43(H) 2.98(H) 2.67(H)  BUN/Creat Ratio 9 - 23 - - -  Sodium 135 - 145 mmol/L 142 142 141  Potassium 3.5 - 5.1 mmol/L 3.7 3.8 4.1  Chloride 98 - 111 mmol/L 109 106 106  CO2 22 - 32 mmol/L 22 19(L) 19(L)  Calcium 8.9 - 10.3 mg/dL 9.2 10.2 10.6(H)

## 2019-01-20 ENCOUNTER — Other Ambulatory Visit: Payer: Self-pay | Admitting: Family Medicine

## 2019-01-20 DIAGNOSIS — F17200 Nicotine dependence, unspecified, uncomplicated: Secondary | ICD-10-CM

## 2019-01-20 DIAGNOSIS — I10 Essential (primary) hypertension: Secondary | ICD-10-CM

## 2019-02-05 ENCOUNTER — Other Ambulatory Visit: Payer: Self-pay | Admitting: Family Medicine

## 2019-02-05 DIAGNOSIS — R1115 Cyclical vomiting syndrome unrelated to migraine: Secondary | ICD-10-CM

## 2019-02-05 DIAGNOSIS — K3184 Gastroparesis: Secondary | ICD-10-CM

## 2019-02-05 DIAGNOSIS — E1143 Type 2 diabetes mellitus with diabetic autonomic (poly)neuropathy: Secondary | ICD-10-CM

## 2019-02-05 NOTE — Telephone Encounter (Signed)
Pt would like to have her oxycodone refilled.

## 2019-02-06 MED ORDER — OXYCODONE HCL 5 MG PO TABS: 5.0000 mg | ORAL_TABLET | ORAL | 0 refills | Status: DC | PRN

## 2019-02-26 LAB — BASIC METABOLIC PANEL
BUN: 20 (ref 4–21)
Creatinine: 2 — AB (ref 0.5–1.1)
Glucose: 64
Potassium: 4.4 (ref 3.4–5.3)
Sodium: 142 (ref 137–147)

## 2019-02-26 LAB — CBC AND DIFFERENTIAL: Hemoglobin: 11 — AB (ref 12.0–16.0)

## 2019-03-01 ENCOUNTER — Emergency Department (HOSPITAL_COMMUNITY): Payer: 59

## 2019-03-01 ENCOUNTER — Emergency Department (HOSPITAL_COMMUNITY)
Admission: EM | Admit: 2019-03-01 | Discharge: 2019-03-01 | Disposition: A | Discharge status: Home / Self Care | Payer: 59 | Attending: Emergency Medicine | Admitting: Emergency Medicine

## 2019-03-01 ENCOUNTER — Encounter (HOSPITAL_COMMUNITY): Payer: Self-pay

## 2019-03-01 ENCOUNTER — Other Ambulatory Visit: Payer: Self-pay

## 2019-03-01 ENCOUNTER — Ambulatory Visit: Payer: 59 | Admitting: Family Medicine

## 2019-03-01 DIAGNOSIS — Z79899 Other long term (current) drug therapy: Secondary | ICD-10-CM | POA: Diagnosis not present

## 2019-03-01 DIAGNOSIS — E1122 Type 2 diabetes mellitus with diabetic chronic kidney disease: Secondary | ICD-10-CM | POA: Insufficient documentation

## 2019-03-01 DIAGNOSIS — F17210 Nicotine dependence, cigarettes, uncomplicated: Secondary | ICD-10-CM | POA: Diagnosis not present

## 2019-03-01 DIAGNOSIS — F1210 Cannabis abuse, uncomplicated: Secondary | ICD-10-CM | POA: Diagnosis not present

## 2019-03-01 DIAGNOSIS — Z794 Long term (current) use of insulin: Secondary | ICD-10-CM | POA: Insufficient documentation

## 2019-03-01 DIAGNOSIS — E1140 Type 2 diabetes mellitus with diabetic neuropathy, unspecified: Secondary | ICD-10-CM | POA: Insufficient documentation

## 2019-03-01 DIAGNOSIS — R112 Nausea with vomiting, unspecified: Secondary | ICD-10-CM | POA: Insufficient documentation

## 2019-03-01 DIAGNOSIS — R1013 Epigastric pain: Secondary | ICD-10-CM | POA: Insufficient documentation

## 2019-03-01 DIAGNOSIS — N184 Chronic kidney disease, stage 4 (severe): Secondary | ICD-10-CM | POA: Diagnosis not present

## 2019-03-01 DIAGNOSIS — I129 Hypertensive chronic kidney disease with stage 1 through stage 4 chronic kidney disease, or unspecified chronic kidney disease: Secondary | ICD-10-CM | POA: Diagnosis not present

## 2019-03-01 LAB — URINALYSIS, ROUTINE W REFLEX MICROSCOPIC
Bilirubin Urine: NEGATIVE
Glucose, UA: NEGATIVE mg/dL
Ketones, ur: 5 mg/dL — AB
Leukocytes,Ua: NEGATIVE
Nitrite: NEGATIVE
PH: 5 (ref 5.0–8.0)
Protein, ur: 100 mg/dL — AB
Specific Gravity, Urine: 1.015 (ref 1.005–1.030)

## 2019-03-01 LAB — COMPREHENSIVE METABOLIC PANEL
ALT: 17 U/L (ref 0–44)
AST: 25 U/L (ref 15–41)
Albumin: 4.5 g/dL (ref 3.5–5.0)
Alkaline Phosphatase: 89 U/L (ref 38–126)
Anion gap: 13 (ref 5–15)
BUN: 24 mg/dL — ABNORMAL HIGH (ref 6–20)
CO2: 22 mmol/L (ref 22–32)
Calcium: 10.4 mg/dL — ABNORMAL HIGH (ref 8.9–10.3)
Chloride: 107 mmol/L (ref 98–111)
Creatinine, Ser: 2.23 mg/dL — ABNORMAL HIGH (ref 0.44–1.00)
GFR calc Af Amer: 28 mL/min — ABNORMAL LOW (ref >60)
GFR calc non Af Amer: 24 mL/min — ABNORMAL LOW (ref >60)
Glucose, Bld: 176 mg/dL — ABNORMAL HIGH (ref 70–99)
Potassium: 4.1 mmol/L (ref 3.5–5.1)
Sodium: 142 mmol/L (ref 135–145)
TOTAL PROTEIN: 8.3 g/dL — AB (ref 6.5–8.1)
Total Bilirubin: 0.9 mg/dL (ref 0.3–1.2)

## 2019-03-01 LAB — CBC WITH DIFFERENTIAL/PLATELET
Abs Immature Granulocytes: 0.02 10*3/uL (ref 0.00–0.07)
Basophils Absolute: 0 10*3/uL (ref 0.0–0.1)
Basophils Relative: 1 %
Eosinophils Absolute: 0 10*3/uL (ref 0.0–0.5)
Eosinophils Relative: 0 %
HEMATOCRIT: 39.3 % (ref 36.0–46.0)
Hemoglobin: 12.9 g/dL (ref 12.0–15.0)
Immature Granulocytes: 0 %
Lymphocytes Relative: 32 %
Lymphs Abs: 2 10*3/uL (ref 0.7–4.0)
MCH: 30.4 pg (ref 26.0–34.0)
MCHC: 32.8 g/dL (ref 30.0–36.0)
MCV: 92.7 fL (ref 80.0–100.0)
MONO ABS: 0.4 10*3/uL (ref 0.1–1.0)
Monocytes Relative: 7 %
Neutro Abs: 3.8 10*3/uL (ref 1.7–7.7)
Neutrophils Relative %: 60 %
Platelets: 265 10*3/uL (ref 150–400)
RBC: 4.24 MIL/uL (ref 3.87–5.11)
RDW: 14 % (ref 11.5–15.5)
WBC: 6.4 10*3/uL (ref 4.0–10.5)
nRBC: 0 % (ref 0.0–0.2)

## 2019-03-01 LAB — LIPASE, BLOOD: Lipase: 71 U/L — ABNORMAL HIGH (ref 11–51)

## 2019-03-01 LAB — I-STAT TROPONIN, ED
Troponin i, poc: 0.01 ng/mL (ref 0.00–0.08)
Troponin i, poc: 0.01 ng/mL (ref 0.00–0.08)

## 2019-03-01 MED ORDER — HYDROMORPHONE HCL 1 MG/ML IJ SOLN
1.0000 mg | Freq: Once | INTRAMUSCULAR | Status: AC
  Administered 2019-03-01: 1 mg via INTRAVENOUS
  Filled 2019-03-01: qty 1

## 2019-03-01 MED ORDER — SODIUM CHLORIDE 0.9 % IV BOLUS
1000.0000 mL | Freq: Once | INTRAVENOUS | Status: AC
  Administered 2019-03-01: 1000 mL via INTRAVENOUS

## 2019-03-01 MED ORDER — HYDRALAZINE HCL 20 MG/ML IJ SOLN
10.0000 mg | Freq: Once | INTRAMUSCULAR | Status: DC
  Filled 2019-03-01: qty 1

## 2019-03-01 MED ORDER — LORAZEPAM 2 MG/ML IJ SOLN
1.0000 mg | Freq: Once | INTRAMUSCULAR | Status: AC
  Administered 2019-03-01: 1 mg via INTRAVENOUS
  Filled 2019-03-01: qty 1

## 2019-03-01 MED ORDER — PROMETHAZINE HCL 25 MG/ML IJ SOLN
25.0000 mg | Freq: Once | INTRAMUSCULAR | Status: AC
  Administered 2019-03-01: 25 mg via INTRAVENOUS
  Filled 2019-03-01: qty 1

## 2019-03-01 MED ORDER — FAMOTIDINE 20 MG PO TABS: 10.0000 mg | ORAL_TABLET | Freq: Every day | ORAL | 0 refills | Status: DC

## 2019-03-01 MED ORDER — SODIUM CHLORIDE 0.9 % IV SOLN
1.0000 g | Freq: Once | INTRAVENOUS | Status: AC
  Administered 2019-03-01: 1 g via INTRAVENOUS
  Filled 2019-03-01: qty 10

## 2019-03-01 MED ORDER — ONDANSETRON HCL 4 MG/2ML IJ SOLN: 4.0000 mg | Freq: Once | INTRAMUSCULAR | Status: DC

## 2019-03-01 MED ORDER — ONDANSETRON 4 MG PO TBDP: 4.0000 mg | ORAL_TABLET | Freq: Three times a day (TID) | ORAL | 0 refills | Status: DC | PRN

## 2019-03-01 NOTE — ED Triage Notes (Signed)
Pt arrives POV c/o n/v and epigastric abdominal pain. Pt reports having similar episode last week; also reports left sided flank pain. Pt reports going to PCP who then told her to come to the ED.

## 2019-03-01 NOTE — ED Provider Notes (Signed)
Physical Exam  BP (!) 146/83   Pulse 81   Temp (!) 97.4 F (36.3 C) (Axillary)   Resp 16   Ht 5' 1.5" (1.562 m)   Wt 68.2 kg   SpO2 100%   BMI 27.96 kg/m   Physical Exam Vitals signs and nursing note reviewed.  Constitutional:      General: She is not in acute distress.    Appearance: She is well-developed. She is not diaphoretic.  HENT:     Head: Normocephalic and atraumatic.  Eyes:     General: No scleral icterus.    Conjunctiva/sclera: Conjunctivae normal.  Neck:     Musculoskeletal: Normal range of motion.  Pulmonary:     Effort: Pulmonary effort is normal. No respiratory distress.  Skin:    Findings: No rash.  Neurological:     Mental Status: She is alert.     ED Course/Procedures     Procedures  MDM  Care handed off from previous provider PA couture.  Please see their note for further detail.  Briefly, patient with a past medical history of CKD, cyclical vomiting syndrome, gastroparesis, diabetes, hypertension presents to the ED with a chief complaint of nausea, vomiting, epigastric abdominal pain.  Patient's nausea and vomiting controlled here in the ED.  She was given an antibiotic for a UTI which she has not yet started.  She has been complaining of flank pain.  Plan is to obtain CT renal stone study to rule out kidney stone.  Patient will be discharged home.  4:30 PM   Ct Renal Stone Study  Result Date: 03/01/2019 CLINICAL DATA:  Nausea and vomiting and epigastric pain EXAM: CT ABDOMEN AND PELVIS WITHOUT CONTRAST TECHNIQUE: Multidetector CT imaging of the abdomen and pelvis was performed following the standard protocol without IV contrast. COMPARISON:  06/09/2015 FINDINGS: Lower chest: No acute abnormality. Hepatobiliary: No focal liver abnormality is seen. Status post cholecystectomy. No biliary dilatation. Pancreas: Unremarkable. No pancreatic ductal dilatation or surrounding inflammatory changes. Spleen: Normal in size without focal abnormality.  Adrenals/Urinary Tract: Adrenal glands are within normal limits. Kidneys are well visualized bilaterally. Renal vascular calcifications are noted. No definitive renal stones are seen. No obstructive changes are noted. The bladder is well distended. Some bladder wall thickening is noted particularly anteriorly on the right of uncertain significance. This was seen on the prior exam but the bladder was more decompressed. Correlation with urinalysis is recommended. Stomach/Bowel: The appendix is well visualized and air filled. No inflammatory changes to suggest appendicitis are noted. No obstructive or inflammatory changes of large or small bowel are seen. The stomach is within normal limits. Vascular/Lymphatic: Aortic atherosclerosis. No enlarged abdominal or pelvic lymph nodes. Reproductive: Status post hysterectomy. No adnexal masses. Other: No abdominal wall hernia or abnormality. No abdominopelvic ascites. Musculoskeletal: No acute or significant osseous findings. IMPRESSION: Mild bladder wall thickening of uncertain significance. Correlation with urinalysis is recommended. Normal-appearing appendix. No other focal abnormality is noted. Electronically Signed   By: Inez Catalina M.D.   On: 03/01/2019 15:57    CT renal stone study shows bladder wall thickening.  UA is negative but will have patient take antibiotics as prescribed by PCP.  Patient is agreeable to the plan.  Will send home with Zofran and Pepcid as needed.  Patient is hemodynamically stable, in NAD, and able to ambulate in the ED. Evaluation does not show pathology that would require ongoing emergent intervention or inpatient treatment. I explained the diagnosis to the patient. Pain has been managed  and has no complaints prior to discharge. Patient is comfortable with above plan and is stable for discharge at this time. All questions were answered prior to disposition. Strict return precautions for returning to the ED were discussed. Encouraged  follow up with PCP.    Portions of this note were generated with [employer]. Dictation errors may occur despite best attempts at proofreading.       Delia Heady, PA-C 03/01/19 1632    Margette Fast, MD 03/01/19 [phone]

## 2019-03-01 NOTE — ED Notes (Signed)
ED Provider at bedside. 

## 2019-03-01 NOTE — Discharge Instructions (Signed)
Continue your home medications as previously prescribed. Continue taking the antibiotics that were prescribed to you for urinary tract infection. Return to the ED for worsening symptoms, trouble breathing, trouble swallowing, vomiting or coughing up blood.

## 2019-03-01 NOTE — ED Provider Notes (Signed)
[facility] EMERGENCY DEPARTMENT Provider Note   CSN: [id number] Arrival date & time: 03/01/19  1023    History   Chief Complaint Chief Complaint  Patient presents with  . Abdominal Pain  . Emesis    HPI [name] [name] is a 53 y.o. female.     HPI   Patient is a 53 year old female with a history of CKD cyclic vomiting syndrome, gastroparesis, diabetes, hypertension, diabetes, who presents to the emergency department today complaining of nausea, vomiting and epigastric abdominal pain.  States she has a long history of cyclic vomiting syndrome and she normally treats her symptoms at home and has them well controlled.  States she had an episode about a week ago where she was vomiting but this is since resolved.  At that time she also developed some left flank pain which she still has.  To days ago she developed gastric abdominal pain and then this morning she began vomiting several times.  She has been unable to keep anything down including her blood pressure medications.  She denies any chest pain or shortness of breath.  She does note some diarrhea.  No constipation or hematemesis.  She denies dysuria or hematuria, but reports bladder pressure and urgency.  Reports sweats/chills but denies fevers.   Patient states she had an appointment with her PCP, Dr. Vikki [name] and at 72 and showed up to her appointment about an hour and a half early.  She states that while she was waiting she was advised to come to the ED given the severity of her symptoms.  He states after arriving to the ED she was contacted by her nephrologist, Dr. Carmina [name], and was informed that she has a urinary tract infection.  Past Medical History:  Diagnosis Date  . Abdominal pain, generalized   . Acquired bilateral hammer toes 01/20/2017  . Allergy   . Anemia   . Anxiety   . Arthritis   . At risk for polypharmacy 08/15/2014  . Biceps tendinopathy of right upper extremity 04/05/2014  . Blood  transfusion 2003  . Cataract    both eyes  . Chronic generalized abdominal pain   . Chronic generalized abdominal pain   . Chronic kidney disease (CKD), stage III (moderate) (HCC)   . Chronic leg cramping 07/18/2014  . CKD (chronic kidney disease) 08/17/2013   Seen by Dr Joelyn Oms 07/28/15 ([place]) - (see scanned document): 20 years diabetes, 15 years HTN, stable CKD stage III with proteinuria.  - CKD, discussed NSAID avoidance, diabetes control with target A1c<7.5%, and HTN control with SBP<140. F/u 6 months and repeat renal panel at that time. - Microalbuminuria: On lisinopril 70m, suspected secondary due to diabetic nephropathy. - Chronic back pain: No NSDAIDs. - Say amlodipine causes nausea  - Advised not to ACE-i during vomiting episodes    . Complication of anesthesia    "problems waking up"  . Cyclic vomiting syndrome   . Delayed gastric emptying 05/12/2011    04/12/11 NUCLEAR MEDICINE GASTRIC EMPTYING SCAN [employer]: 2.0 mCi [id number] colloid. Comparison: None. Findings: At 1 hour, 81% of the counts remain in the stomach. At 2 hours, 53% remain. Normal is less than 30% at 2 hours. IMPRESSION: Moderate delay in gastric emptying.      . Depression   . Depression with anxiety 02/23/2007   See [facility] dated 05/06/2016.  IC (KGwenlyn Saranwas called in and I did a brief assessment.   . Diabetic gastroparesis ([facility]   /  e-chart  . Diabetic ketoacidosis without coma associated with type 2 diabetes mellitus ([place])   . DKA (diabetic ketoacidoses) ([place]) 08/03/2016  . Dysuria 10/01/2016  . Elevated cholesterol 11/28/2015  . Emotional stress 12/30/2015   Stress after Breakup with partnes of 13 years on 12/27/2015   . Encounter for chronic pain management 01/06/2015   Indication for chronic opioid: Chronic back pain, cyclic vomiting Medication and dose: oxycodone 22m  # pills per month: 15 Last UDS date: 05/08/2015 - result pending Pain contract signed (Y/N): Yes, reviewed  01/06/2015  Date narcotic database last reviewed (include red flags): 05/08/2015 (no red flags).    . Esophagitis 04/18/2013   Documented by Dr. PHenrene PastorEGD 03/2011  He also consulted on her during hospitalization [id number] cyclical vomiting. No repeat EGD necessary, treat esophagitis with PPI.    .Marland KitchenEssential hypertension 05/01/2016  . Essential hypertension, malignant 02/23/2007   Jan 2015 - stopped recently-started norvasc due to reports of GI symptoms. 02/2015 - holding lisinopril, adding hydralazine 16mTID, and will take 1/2 tab lisinopril when feeling better when not in cyclic vomiting flare   . Essential hypertension, malignant 02/23/2007   Jan 2015 - stopped recently-started norvasc due to reports of GI symptoms. 02/2015 - holding lisinopril, adding hydralazine 1031mID, and will take 1/2 tab lisinopril when feeling better when not in cyclic vomiting flare   . GERD (gastroesophageal reflux disease)   . Heart murmur   . Hematuria 07/09/2015  . High risk social situation 08/31/2012  . History of colonic polyps 11/15/2018   10/2017 screening colonoscopy Dr JohScarlette Shortseven polyps were found in the sigmoid colon, transverse colon, ascending colon and cecum. The polyps were 2 to 10 mm in size. These polyps were removed with a cold snare.   . HMarland Kitchent flashes 07/29/2011  . Hypertension   . Incontinence 10/03/2017  . INSOMNIA NOS 02/23/2007   Qualifier: Diagnosis of  By: HarBeryle Lathe . Intermittent constipation 06/06/2015  . Intestinal impaction ([facility]CC[facility] . Intractable vomiting 07/02/2016  . Marijuana smoker, continuous    pt. denies does smoke marijuana  . MIGRAINE, UNSPEC., W/O INTRACTABLE MIGRAINE 02/23/2007   Qualifier: Diagnosis of  By: HarBeryle Lathe . [facility]   "they say I've had a silent one; I don't know"  . Nausea and vomiting in adult 06/06/2013  . Neuromuscular disorder (HCC)    neuropathy in both legs  . NEUROPATHY, DIABETIC 02/23/2007  . Nonspecific low back  pain 04/10/2007   Back pain is chronic. Oxycodone per prior notes helps pt be more active. Reportedly uses 4 tab/week oxy for intermittent worsening of chronic back pain but mostly for cyclic vom to keep her out of hospital.    . Normocytic anemia   . Onychomycosis 03/25/2015  . PANCREATITIS 05/09/2008  . Pneumonia   . Protein-calorie malnutrition, severe ([facility]CC[facility] Center2/22/2014  . Pure hypercholesterolemia 08/15/2014   Based on her choleserol, BP, smoker status, and that she is a diabetic, her 10 year ASCVD risk is 9.9% putting her in a category of risk that would benefit from high-intensity statin.    . Rotator cuff syndrome 04/19/2014   rt. shoulder  . Smoker   . TOBACCO DEPENDENCE 02/23/2007   2 cigarettes every few days 11/2012 3 cigarettes daily    . Type II diabetes mellitus ([facility] . Uncontrolled secondary diabetes mellitus with stage 3 CKD (GFR 30-59) (HCC) 10/23/2011   Nephropathy, gastroparesis  HgbA1c (  06/2011) 7.6, 9.5, 12.2, 8.2, 10.9 (07/2012), 9.2 (02/11/2013), 7.7 (09/27/13)  Metformin: she has a history of cyclical vomiting; she would prefer not to take this medication  On lanuts 20U qam with improved control. 09/27/13   . Vitamin D deficiency 11/28/2014    Patient Active Problem List   Diagnosis Date Noted  . Acute post-traumatic stress disorder 11/17/2018  . History of multiple colonic tubular adenomas 11/15/2018  . Grief reaction 10/16/2018  . Left ventricular diastolic dysfunction with preserved systolic function 72/08/4708  . Intractable vomiting 08/12/2018  . Chronic right shoulder pain 03/31/2018  . Hip pain, bilateral 03/31/2018  . Incontinence 10/03/2017  . Healthcare maintenance 09/02/2017  . Stye external 09/02/2017  . Pre-ulcerative calluses 05/27/2017  . Screening for colon cancer 05/27/2017  . Primary income source is welfare benefits 03/10/2017  . Acquired bilateral hammer toes 01/20/2017  . Port-A-Cath in place 01/20/2017  . Secondary hyperparathyroidism of  renal origin ([place]) 10/05/2016  . Recurrent major depressive disorder ([place])   . Essential hypertension 05/01/2016  . Diabetic gastroparesis associated with type 2 diabetes mellitus ([place]) 05/01/2016  . DM (diabetes mellitus), type 2 with complications ([place])   . Hyperkalemia 11/28/2015  . Normocytic anemia   . Intermittent constipation 06/06/2015  . Encounter for monitoring opioid maintenance therapy 01/06/2015  . Vitamin D deficiency 11/28/2014  . Pure hypercholesterolemia 08/15/2014  . Chronic kidney disease (CKD) stage G4/A2, severely decreased glomerular filtration rate (GFR) between 15-29 mL/min/1.73 square meter and albuminuria creatinine ratio between 30-299 mg/g (HCC) 08/17/2013  . Esophagitis determined by endoscopy 04/18/2013  . Allergic rhinitis, seasonal 05/04/2012  . Marijuana smoker ([place]) 11/03/2011  . Cyclic vomiting syndrome 08/03/2011  . Chronic bilateral low back pain 04/10/2007  . Anxiety disorder 02/23/2007  . TOBACCO DEPENDENCE 02/23/2007  . Chronic insomnia secondary to General Medical and Mood disorders 02/23/2007  . DM neuropathies ([place]) 02/23/2007  . GERD with esophagitis 01/27/2004    Past Surgical History:  Procedure Laterality Date  . ABDOMINAL HYSTERECTOMY  02/2002  . CARDIAC CATHETERIZATION    . CATARACT EXTRACTION W/ INTRAOCULAR LENS  IMPLANT, BILATERAL    . CESAREAN SECTION     x 3  . CHOLECYSTECTOMY  1980's  . COLONOSCOPY     patient not sure  . DILATION AND EVACUATION  X3  . laparotomy and lysis of adhesions    . left hand surgery    . PORT-A-CATH placement  ~ 2008   right chest; "poor access; frequent hospitalizations"  . SMALL INTESTINE SURGERY     laporotomy with lysis of adhesions.  Marland Kitchen UPPER GASTROINTESTINAL ENDOSCOPY    . UPPER GI ENDOSCOPY       OB History    Gravida  7   Para      Term      Preterm      AB  3   Living  4     SAB      TAB  3   Ectopic      Multiple      Live Births               Home  Medications    Prior to Admission medications   Medication Sig Start Date End Date Taking? Authorizing Provider  AMITIZA 24 MCG capsule TAKE 1 CAPSULE(24 MCG) BY MOUTH TWICE DAILY WITH A MEAL Patient taking differently: Take 24 mcg by mouth 2 (two) times daily with a meal.  03/31/18  Yes McDiarmid, Blane Ohara, MD  amLODipine (NORVASC)  5 MG tablet Take 1 tablet (5 mg total) by mouth daily. 04/11/18  Yes McDiarmid, Blane Ohara, MD  carvedilol (COREG) 6.25 MG tablet TAKE 1 TABLET BY MOUTH TWICE DAILY WITH MEALS 01/22/19  Yes McDiarmid, Blane Ohara, MD  Insulin Glargine (LANTUS SOLOSTAR) 100 UNIT/ML Solostar Pen Inject 20 Units into the skin daily. 12/28/18  Yes McDiarmid, Blane Ohara, MD  LORazepam (ATIVAN) 1 MG tablet Take 1 tablet (1 mg total) by mouth every 6 (six) hours as needed (cyclic vomiting). 12/28/18  Yes McDiarmid, Blane Ohara, MD  oxyCODONE (OXY IR/ROXICODONE) 5 MG immediate release tablet Take 1 tablet (5 mg total) by mouth every 4 (four) hours as needed for severe pain. 02/06/19  Yes McDiarmid, Blane Ohara, MD  promethazine (PHENERGAN) 12.5 MG tablet Take 1 tablet (12.5 mg total) by mouth every 6 (six) hours as needed for nausea or vomiting. 09/21/18  Yes McDiarmid, Blane Ohara, MD  Blood Glucose Monitoring Suppl (ONE TOUCH ULTRA SYSTEM KIT) w/Device KIT 1 kit by Does not apply route 2 (two) times daily before a meal. 01/20/17   McDiarmid, Blane Ohara, MD  famotidine (PEPCID) 20 MG tablet Take 0.5 tablets (10 mg total) by mouth daily. 03/01/19   Khatri, Hina, PA-C  glucose blood test strip Use as instructed 12/28/18   McDiarmid, Blane Ohara, MD  Insulin Pen Needle (B-D UF III MINI PEN NEEDLES) 31G X 5 MM MISC Check CBGs three times a day for E11.9 10/27/18   McDiarmid, Blane Ohara, MD  ondansetron (ZOFRAN ODT) 4 MG disintegrating tablet Take 1 tablet (4 mg total) by mouth every 8 (eight) hours as needed for nausea or vomiting. 03/01/19   Khatri, Hina, PA-C  temazepam (RESTORIL) 15 MG capsule Take 1 capsule (15 mg total) by mouth at bedtime as needed for  sleep. Patient not taking: Reported on 03/01/2019 11/16/18   McDiarmid, Blane Ohara, MD    Family History Family History  Problem Relation Age of Onset  . Diabetes type II Mother   . Diabetes Mother   . Hypertension Mother   . Heart attack Mother   . Diabetes type II Sister   . Diabetes Sister   . Hypertension Sister   . Alcohol abuse Father   . Diabetes Sister   . Diabetes Sister   . Cancer Sister 1       Brain  . Colon cancer Neg Hx   . Colon polyps Neg Hx   . Esophageal cancer Neg Hx   . Rectal cancer Neg Hx   . Stomach cancer Neg Hx     Social History Social History   Tobacco Use  . Smoking status: Current Some Day Smoker    Packs/day: 0.50    Years: 22.00    Pack years: 11.00    Types: Cigarettes    Start date: 12/27/1982  . Smokeless tobacco: Never Used  Substance Use Topics  . Alcohol use: Yes    Comment: occaisionally; pt reports drinking beer to help her when constipated  . Drug use: Yes    Types: Marijuana    Comment: pt reports she has not "smoked" in 2 wks     Allergies   Bee venom; Tegaderm ag mesh [silver]; Acetaminophen; Novolog [insulin aspart]; Statins; Lyrica [pregabalin]; Tape; Ceftriaxone; and Erythromycin   Review of Systems Review of Systems  Constitutional: Negative for fever.  HENT: Negative for ear pain and sore throat.   Eyes: Negative for pain and visual disturbance.  Respiratory: Negative for cough and shortness of breath.  Cardiovascular: Negative for chest pain.  Gastrointestinal: Positive for abdominal pain, diarrhea, nausea and vomiting. Negative for blood in stool and constipation.  Genitourinary: Positive for flank pain and urgency. Negative for dysuria, frequency and hematuria.  Musculoskeletal: Negative for myalgias.  Skin: Negative for rash.  Neurological: Negative for headaches.  All other systems reviewed and are negative.    Physical Exam Updated Vital Signs BP (!) 155/77 (BP Location: Right Arm)   Pulse (!) 58    Temp 98.2 F (36.8 C) (Oral)   Resp 16   Ht 5' 1.5" (1.562 m)   Wt 68.2 kg   SpO2 100%   BMI 27.96 kg/m   Physical Exam Vitals signs and nursing note reviewed.  Constitutional:      General: She is in acute distress.     Appearance: She is well-developed.  HENT:     Head: Normocephalic and atraumatic.  Eyes:     Conjunctiva/sclera: Conjunctivae normal.  Neck:     Musculoskeletal: Neck supple.  Cardiovascular:     Rate and Rhythm: Regular rhythm. Tachycardia present.     Heart sounds: Normal heart sounds. No murmur.  Pulmonary:     Effort: Pulmonary effort is normal. No respiratory distress.     Breath sounds: Normal breath sounds. No stridor. No wheezing or rhonchi.  Abdominal:     General: Bowel sounds are normal.     Palpations: Abdomen is soft.     Tenderness: There is abdominal tenderness in the epigastric area. There is left CVA tenderness. There is no right CVA tenderness, guarding or rebound.  Skin:    General: Skin is warm and dry.  Neurological:     Mental Status: She is alert.  Psychiatric:     Comments: Pt tearful and anxious    ED Treatments / Results  Labs (all labs ordered are listed, but only abnormal results are displayed) Labs Reviewed  COMPREHENSIVE METABOLIC PANEL - Abnormal; Notable for the following components:      Result Value   Glucose, Bld 176 (*)    BUN 24 (*)    Creatinine, Ser 2.23 (*)    Calcium 10.4 (*)    Total Protein 8.3 (*)    GFR calc non Af Amer 24 (*)    GFR calc Af Amer 28 (*)    All other components within normal limits  LIPASE, BLOOD - Abnormal; Notable for the following components:   Lipase 71 (*)    All other components within normal limits  URINALYSIS, ROUTINE W REFLEX MICROSCOPIC - Abnormal; Notable for the following components:   Hgb urine dipstick SMALL (*)    Ketones, ur 5 (*)    Protein, ur 100 (*)    Bacteria, UA MANY (*)    All other components within normal limits  URINE CULTURE  CBC WITH  DIFFERENTIAL/PLATELET  I-STAT TROPONIN, ED  I-STAT TROPONIN, ED    EKG EKG Interpretation  Date/Time:  Thursday March 01 2019 11:13:19 EST Ventricular Rate:  89 PR Interval:    QRS Duration: 81 QT Interval:  365 QTC Calculation: 445 R Axis:   83 Text Interpretation:  Sinus rhythm Right atrial enlargement Anterior infarct, old When compared to prior, slower rate.   No STEMI Confirmed by Antony Blackbird [phone]) on 03/01/2019 1:16:06 PM   Radiology Ct Renal Stone Study  Result Date: 03/01/2019 CLINICAL DATA:  Nausea and vomiting and epigastric pain EXAM: CT ABDOMEN AND PELVIS WITHOUT CONTRAST TECHNIQUE: Multidetector CT imaging of the abdomen and pelvis was  performed following the standard protocol without IV contrast. COMPARISON:  06/09/2015 FINDINGS: Lower chest: No acute abnormality. Hepatobiliary: No focal liver abnormality is seen. Status post cholecystectomy. No biliary dilatation. Pancreas: Unremarkable. No pancreatic ductal dilatation or surrounding inflammatory changes. Spleen: Normal in size without focal abnormality. Adrenals/Urinary Tract: Adrenal glands are within normal limits. Kidneys are well visualized bilaterally. Renal vascular calcifications are noted. No definitive renal stones are seen. No obstructive changes are noted. The bladder is well distended. Some bladder wall thickening is noted particularly anteriorly on the right of uncertain significance. This was seen on the prior exam but the bladder was more decompressed. Correlation with urinalysis is recommended. Stomach/Bowel: The appendix is well visualized and air filled. No inflammatory changes to suggest appendicitis are noted. No obstructive or inflammatory changes of large or small bowel are seen. The stomach is within normal limits. Vascular/Lymphatic: Aortic atherosclerosis. No enlarged abdominal or pelvic lymph nodes. Reproductive: Status post hysterectomy. No adnexal masses. Other: No abdominal wall hernia or  abnormality. No abdominopelvic ascites. Musculoskeletal: No acute or significant osseous findings. IMPRESSION: Mild bladder wall thickening of uncertain significance. Correlation with urinalysis is recommended. Normal-appearing appendix. No other focal abnormality is noted. Electronically Signed   By: Inez Catalina M.D.   On: 03/01/2019 15:57    Procedures Procedures (including critical care time)  Medications Ordered in ED Medications  hydrALAZINE (APRESOLINE) injection 10 mg (10 mg Intravenous Not Given 03/01/19 1210)  ondansetron (ZOFRAN) injection 4 mg (4 mg Intravenous Not Given 03/01/19 1300)  sodium chloride 0.9 % bolus 1,000 mL (0 mLs Intravenous Stopped 03/01/19 1333)  LORazepam (ATIVAN) injection 1 mg (1 mg Intravenous Given 03/01/19 1130)  promethazine (PHENERGAN) injection 25 mg (25 mg Intravenous Given 03/01/19 1130)  HYDROmorphone (DILAUDID) injection 1 mg (1 mg Intravenous Given 03/01/19 1130)  cefTRIAXone (ROCEPHIN) 1 g in sodium chloride 0.9 % 100 mL IVPB (0 g Intravenous Stopped 03/01/19 1342)     Initial Impression / Assessment and Plan / ED Course  I have reviewed the triage vital signs and the nursing notes.  Pertinent labs & imaging results that were available during my care of the patient were reviewed by me and considered in my medical decision making (see chart for details).     Final Clinical Impressions(s) / ED Diagnoses   Final diagnoses:  Non-intractable vomiting with nausea, unspecified vomiting type   Patient with long history of cyclic vomiting syndrome presenting with epigastric abdominal pain, nausea and vomiting.  Has also had left flank pain and some urinary symptoms.  Was told by her nephrologist today that she has a urinary tract infection.  She has not had any fevers.  She is afebrile today.  She is initially tachycardic and hypertensive with normal O2 sats and respirations.    Abdominal exam with epigastric tenderness, without rebound rigidity or guarding.   Also with left CVA tenderness.  CBC with no leukocytosis, no anemia CMP with normal electrolytes.  Glucose is slightly elevated at 176.  BUN and creatinine are elevated but consistent with prior and actually improved from prior.  Normal liver function.  No elevated anion gap Lipase is slightly elevated which does appear consistent with patient's prior labs. Troponin negative UA with hematuria, ketonuria, proteinuria, 11-20 RBCs, 0-5 WBCs and many bacteria.  Given no evidence of leukocytes or nitrites with evidence of blood in the urine and left flank pain concern for possible stone.  CT renal scan ordered  EKG with normal sinus rhythm, heart rate 89.  Right  atrial enlargement with evidence of old anterior infarct.  11:42 PM  Reassessed pt. She is now c/o mild intermittent chest discomfort. She states that this happens when she has severe episodes of cyclic vomiting syndrome and is unable to keep her bp meds down. Will add trop and ekg. She vomited x1 after phenergan and ativan. She reports some improvement of abd pain.  12:13 PM Reassessed pt. She appears much more comfortable. Vital signs improved significantly after antiemetics, pain medications, IV fluids, and Ativan. Will hold administrations of HTN meds at this time.  At shift change, CT renal scan pending. Pt care signed out to [facility], PA-C with plan to f/u on Ct renal scan. Anticipate discharge as patient is no longer vomiting and feels much improved. Has tolerated PO.   ED Discharge Orders         Ordered    famotidine (PEPCID) 20 MG tablet  Daily     03/01/19 1628    ondansetron (ZOFRAN ODT) 4 MG disintegrating tablet  Every 8 hours PRN     03/01/19 1628           Rodney Booze, PA-C 03/01/19 1748    Tegeler, Gwenyth Allegra, MD 03/02/19 [phone]

## 2019-03-01 NOTE — ED Notes (Signed)
Repeat EKG given to Dr. Sherry Ruffing.

## 2019-03-01 NOTE — ED Notes (Signed)
Patient verbalizes understanding of discharge instructions. Opportunity for questioning and answers were provided. Armband removed by staff, pt discharged from ED.  

## 2019-03-01 NOTE — ED Notes (Signed)
Patient transported to CT 

## 2019-03-03 LAB — URINE CULTURE: Culture: 70000 — AB

## 2019-03-04 ENCOUNTER — Telehealth: Payer: Self-pay | Admitting: Emergency Medicine

## 2019-03-04 NOTE — Telephone Encounter (Signed)
Post ED Visit - Positive Culture Follow-up  Culture report reviewed by antimicrobial stewardship pharmacist: [facility] Team []  Elenor Quinones, Pharm.D. []  Heide Guile, Pharm.D., BCPS AQ-ID []  Parks Neptune, Pharm.D., BCPS []  Alycia Rossetti, Pharm.D., BCPS [x]  [place], [place].D., BCPS, AAHIVP []  Legrand Como, Pharm.D., BCPS, AAHIVP []  Salome Arnt, PharmD, BCPS []  Johnnette Gourd, PharmD, BCPS []  Hughes Better, PharmD, BCPS []  Leeroy Cha, PharmD []  Laqueta Linden, PharmD, BCPS []  Albertina Parr, PharmD  Media Team []  Leodis Sias, PharmD []  Lindell Spar, PharmD []  Royetta Asal, PharmD []  Graylin Shiver, Rph []  Rema Fendt) Glennon Mac, PharmD []  Arlyn Dunning, PharmD []  Netta Cedars, PharmD []  Dia Sitter, PharmD []  Leone Haven, PharmD []  Gretta Arab, PharmD []  Theodis Shove, PharmD []  Peggyann Juba, PharmD []  Reuel Boom, PharmD   Positive urineculture Treated by PCP,no further patient follow-up is required at this time.  [place] Jahir Halt 03/04/2019, 3:47 PM

## 2019-03-05 ENCOUNTER — Encounter: Payer: Self-pay | Admitting: Family Medicine

## 2019-03-05 LAB — ALBUMIN
ALBUMIN SERUM: 4.4
Calcium: 9.7
Carbon Dioxide, Total: 22
Chloride: 105
Phosphorus, Ser: 3.6

## 2019-03-05 LAB — PTH, INTACT: PTH: 104

## 2019-03-06 ENCOUNTER — Other Ambulatory Visit: Payer: Self-pay | Admitting: Family Medicine

## 2019-03-06 DIAGNOSIS — R1115 Cyclical vomiting syndrome unrelated to migraine: Secondary | ICD-10-CM

## 2019-03-06 DIAGNOSIS — K3184 Gastroparesis: Secondary | ICD-10-CM

## 2019-03-06 DIAGNOSIS — E1143 Type 2 diabetes mellitus with diabetic autonomic (poly)neuropathy: Secondary | ICD-10-CM

## 2019-03-06 NOTE — Telephone Encounter (Signed)
Pt would like to have her Oxycodone refilled.

## 2019-03-07 MED ORDER — OXYCODONE HCL 5 MG PO TABS: 5.0000 mg | ORAL_TABLET | ORAL | 0 refills | Status: DC | PRN

## 2019-03-15 ENCOUNTER — Ambulatory Visit: Payer: 59 | Admitting: Family Medicine

## 2019-03-22 ENCOUNTER — Telehealth (INDEPENDENT_AMBULATORY_CARE_PROVIDER_SITE_OTHER): Payer: 59 | Admitting: Family Medicine

## 2019-03-22 DIAGNOSIS — F1290 Cannabis use, unspecified, uncomplicated: Secondary | ICD-10-CM

## 2019-03-22 DIAGNOSIS — Z794 Long term (current) use of insulin: Principal | ICD-10-CM

## 2019-03-22 DIAGNOSIS — E118 Type 2 diabetes mellitus with unspecified complications: Secondary | ICD-10-CM

## 2019-03-22 DIAGNOSIS — R1115 Cyclical vomiting syndrome unrelated to migraine: Secondary | ICD-10-CM | POA: Diagnosis not present

## 2019-03-22 DIAGNOSIS — E1143 Type 2 diabetes mellitus with diabetic autonomic (poly)neuropathy: Secondary | ICD-10-CM | POA: Diagnosis not present

## 2019-03-22 DIAGNOSIS — E1142 Type 2 diabetes mellitus with diabetic polyneuropathy: Secondary | ICD-10-CM

## 2019-03-22 DIAGNOSIS — E119 Type 2 diabetes mellitus without complications: Secondary | ICD-10-CM

## 2019-03-22 DIAGNOSIS — K3184 Gastroparesis: Secondary | ICD-10-CM

## 2019-03-22 MED ORDER — OXYCODONE HCL 5 MG PO TABS: 5.0000 mg | ORAL_TABLET | ORAL | 0 refills | Status: DC | PRN

## 2019-03-22 MED ORDER — GLUCOSE BLOOD VI STRP: ORAL_STRIP | 12 refills | Status: DC

## 2019-03-22 NOTE — Assessment & Plan Note (Signed)
Established problem Advised against use as it has been associated with vomiting

## 2019-03-22 NOTE — Assessment & Plan Note (Signed)
Established problem. Stable. Continue current therapy Lantus 22 units daily Check fasting CBG and bring to OV in May.

## 2019-03-22 NOTE — Telephone Encounter (Signed)
[facility] Tele[facility]icine Visit  Patient consented to have visit conducted via telephone.  Encounter participants: Patient: [name] [name]  Provider: Sherren Mocha Kali Deadwyler  Others (if applicable): none  Chief Complaint:  Adverse Drug Reaction DMT2 Right arm pain Cyclic vomiting    HPI:  Adverse Drug reaction - Treated in ED with Rocephin for possible UTI on 03/01/2019.  Soon after developed gums "tingling feeling" and tenderness to tocuh with tongue.  Not interfering with breathing nor eating. - She has had similar reaction to Rocephin in past. Rochephin was listed in "Allergy" section of PMH as causing "ulcers in her mouth". Dated  06/21/2016.   - Tenderness and paresthesia has persisted since onset continuously.  It is slowly decreasing,   - Has tried nothing to tx it.  - worse when touches gums or roof of mouth with tongue.  - Denies sores in mouth.  No halitosis. No fever.  No sore throat, no hoarseness.   DMT2 - Unable to check CBGs at home as had incorrect strips dispensed for her glucometer, a Accu-Chek [employer]. - Does not feel that she has had very high or very low blood sugars.  - Taking 22 units Lantus daily.  - No chest pain, no cluadication  Right arm pain - several years ago onset - stable course.  - pain in right shoulder particularly with elevation - numbness of negative type in fingers of right hand.  - Pain improved with injection of shoulder at [facility].   Did not get to have PT after injection  Cyclic Vomiting - onset several years ago - No major exacerbations since the one requiring [facility] ED visit 03/01/19 - Able to abort early exacerbations with combination of lorazepam/phenergan or zofran/ oxycodone.  - Thoughts around vilent death of her son last Fall can precipitate an exacerbation.   ROS: See HPI  SH: (+) smoking marijuana  Assessment/Plan:  DM neuropathies ([place]) Established problem. Stable. Continue current  therapy   Marijuana smoker ([place]) Established problem Advised against use as it has been associated with vomiting  DM (diabetes mellitus), type 2 with complications ([place]) Established problem. Stable. Continue current therapy Lantus 22 units daily Check fasting CBG and bring to OV in May.    Cyclic vomiting syndrome Established problem. Stable. Continue abortive current therapy     Time spent on phone with patient: 10 minutes         Taking 22 units lantus, not   Needs physical therapy  Shoulder improved w/ shots   Cyclic vomiting syndrome No episodes requiring ED visits for IV management Has had multiple short episodes at home that she was able to abort with her combination of lorazepam, oxycodone, and promethazine.  Continue current abortive therapy.  Ms Ashby knows to go to ED if unable to abort n/v episode with oral/suppository medications.   Chronic kidney disease (CKD) stage G4/A2, severely decreased glomerular filtration rate (GFR) between 15-29 mL/min/1.73 square meter and albuminuria creatinine ratio between 30-299 mg/g [facility]) Encouraged patient to follow up with Dr Baird Cancer (Nephro).   Pt missed last follow up consult with Dr Joelyn Oms Will make reconsult to Dr Joelyn Oms [facility] Latest Ref Rng & Units 08/12/2018 08/11/2018 08/11/2018  Glucose 70 - 99 mg/dL 273(H) 431(H) 400(H)  BUN 6 - 20 mg/dL 37(H) 39(H) 35(H)  Creatinine 0.44 - 1.00 mg/dL 2.43(H) 2.98(H) 2.67(H)  BUN/Creat Ratio 9 - 23 - - -  Sodium 135 - 145 mmol/L 142 142 141  Potassium 3.5 - 5.1  mmol/L 3.7 3.8 4.1  Chloride 98 - 111 mmol/L 109 106 106  CO2 22 - 32 mmol/L 22 19(L) 19(L)  Calcium 8.9 - 10.3 mg/dL 9.2 10.2 10.6(H)     Will need oxycodone Will check if need lorazepam   DM (diabetes mellitus), type 2 with complications ([place]) Established problem Uncontrolled glycemia Ms Ganesh is pleased that with change in her Medicare Part C provider she will be able to get Lantus instead of Basaglar  as her insulin glargine. She will start at 1:1 Lantus to Basaglar dosing, 20 units daily.  She has glucose tablets with her at all times.  She is able to tell when her blood glucose is getting too low.   Ms Wileman may be a good candidate for liraglutide GLP1i therapy in future. Goal Is A1c less than 7.5%, stretch <7.0% Pt not on ACEi bc of CKD4.  Need to start Statin next visit.  Check Lipid panel next visit.   I advised Ms Auble that she could stop her daily baby aspirin She needs a diabetic eye exam Urine protein/creatinine next visit    Recurrent major depressive disorder ([place]) Established problem that has improved.  Patient self-stopped mirtazapine bc of feelings of bloating.  Patinet may have component of ATSD/PTSD

## 2019-03-22 NOTE — Assessment & Plan Note (Signed)
Established problem. Stable. Continue current therapy  

## 2019-03-22 NOTE — Assessment & Plan Note (Signed)
Established problem. Stable. Continue abortive current therapy

## 2019-03-22 NOTE — Telephone Encounter (Signed)
Pt calling about her appt that was cancelled on 03-15-2019. Pt wanted to know when she can reschedule it or if she can do a virtual visit. Please call pt back to discuss.

## 2019-04-02 ENCOUNTER — Other Ambulatory Visit: Payer: Self-pay | Admitting: Family Medicine

## 2019-04-02 DIAGNOSIS — E1143 Type 2 diabetes mellitus with diabetic autonomic (poly)neuropathy: Secondary | ICD-10-CM

## 2019-04-02 DIAGNOSIS — R1115 Cyclical vomiting syndrome unrelated to migraine: Secondary | ICD-10-CM

## 2019-04-02 DIAGNOSIS — K3184 Gastroparesis: Secondary | ICD-10-CM

## 2019-04-02 MED ORDER — OXYCODONE HCL 5 MG PO TABS: 5.0000 mg | ORAL_TABLET | ORAL | 0 refills | Status: DC | PRN

## 2019-04-02 NOTE — Progress Notes (Signed)
Presciption for oxycodone to be filled on or after 04/06/19 is canceled.  Mssg sent to pharmacy.  Rx oxycodone 5 mg #45 tab , fill on or after 04/04/19.  This is done to accomodate patient's home delivery of rx by friend and preserve social distancing.

## 2019-04-27 ENCOUNTER — Telehealth: Payer: Self-pay | Admitting: Family Medicine

## 2019-04-30 NOTE — Telephone Encounter (Signed)
error 

## 2019-05-03 ENCOUNTER — Other Ambulatory Visit: Payer: Self-pay | Admitting: Family Medicine

## 2019-05-03 DIAGNOSIS — E1143 Type 2 diabetes mellitus with diabetic autonomic (poly)neuropathy: Secondary | ICD-10-CM

## 2019-05-03 DIAGNOSIS — K3184 Gastroparesis: Secondary | ICD-10-CM

## 2019-05-03 DIAGNOSIS — R1115 Cyclical vomiting syndrome unrelated to migraine: Secondary | ICD-10-CM

## 2019-05-03 NOTE — Telephone Encounter (Signed)
Pt called to see if she could get refill on her pain medication. Please give pt a call back.

## 2019-05-07 MED ORDER — OXYCODONE HCL 5 MG PO TABS: 5.0000 mg | ORAL_TABLET | ORAL | 0 refills | Status: DC | PRN

## 2019-05-10 ENCOUNTER — Telehealth: Payer: 59 | Admitting: Family Medicine

## 2019-05-10 ENCOUNTER — Ambulatory Visit: Payer: 59 | Admitting: Family Medicine

## 2019-05-10 ENCOUNTER — Ambulatory Visit: Payer: 59

## 2019-05-14 ENCOUNTER — Other Ambulatory Visit: Payer: Self-pay

## 2019-05-14 ENCOUNTER — Ambulatory Visit (INDEPENDENT_AMBULATORY_CARE_PROVIDER_SITE_OTHER): Payer: 59 | Admitting: Family Medicine

## 2019-05-14 ENCOUNTER — Encounter: Payer: Self-pay | Admitting: Family Medicine

## 2019-05-14 VITALS — BP 128/74 | HR 60

## 2019-05-14 DIAGNOSIS — K5909 Other constipation: Secondary | ICD-10-CM

## 2019-05-14 DIAGNOSIS — Z79891 Long term (current) use of opiate analgesic: Secondary | ICD-10-CM

## 2019-05-14 DIAGNOSIS — M7581 Other shoulder lesions, right shoulder: Secondary | ICD-10-CM | POA: Diagnosis not present

## 2019-05-14 DIAGNOSIS — E118 Type 2 diabetes mellitus with unspecified complications: Secondary | ICD-10-CM

## 2019-05-14 DIAGNOSIS — K5900 Constipation, unspecified: Secondary | ICD-10-CM

## 2019-05-14 DIAGNOSIS — F17200 Nicotine dependence, unspecified, uncomplicated: Secondary | ICD-10-CM

## 2019-05-14 DIAGNOSIS — Z5181 Encounter for therapeutic drug level monitoring: Secondary | ICD-10-CM

## 2019-05-14 LAB — POCT GLYCOSYLATED HEMOGLOBIN (HGB A1C): HbA1c, POC (controlled diabetic range): 6.7 % (ref 0.0–7.0)

## 2019-05-14 MED ORDER — DICLOFENAC SODIUM 1 % TD GEL: 4.0000 g | Freq: Four times a day (QID) | TRANSDERMAL | 5 refills | Status: DC

## 2019-05-14 MED ORDER — METHYLPREDNISOLONE ACETATE 40 MG/ML IJ SUSP
40.0000 mg | Freq: Once | INTRAMUSCULAR | Status: AC
  Administered 2019-05-14: 40 mg via INTRA_ARTICULAR

## 2019-05-14 NOTE — Assessment & Plan Note (Signed)
Established problem worsened.  Hard stools and infrequent stools.  Slow transit constipation and opioid-related constipation. Surveillance colonoscopy by Dr Henrene Pastor 10/2018 showed 7 tubular adenomas without evidence dysplasia nor  Amitiza not working as well for [name] [name]. Add Metamucil alternating every other day with Amitiza or in combination with it.  May need stimulant laxative (senokot) if this combination is insufficient.

## 2019-05-14 NOTE — Progress Notes (Signed)
Subjective:    Patient ID: [name] [name], female    DOB: 30-Mar-1966, 53 y.o.   MRN: [id number] ELLIS KOFFLER is alone Sources of clinical information for visit is/are patient and past medical records. Nursing assessment for this office visit was reviewed with the patient for accuracy and revision.   Previous Report(s) Reviewed: historical medical records and imaging reports: MRI right shoulder 04/2018.  Depression screen PHQ 2/9 07/28/2018  Decreased Interest 0  Down, Depressed, Hopeless 0  PHQ - 2 Score 0  Altered sleeping -  Tired, decreased energy -  Change in appetite -  Feeling bad or failure about yourself  -  Trouble concentrating -  Moving slowly or fidgety/restless -  Suicidal thoughts -  PHQ-9 Score -  Difficult doing work/chores -  Some recent data might be hidden   Fall Risk  11/16/2018 07/28/2018 01/05/2018 12/09/2017 09/28/2017  Falls in the past year? 0 No No No No  Number falls in past yr: 0 - - - -  Injury with Fall? 0 - - - -  Risk for fall due to : - - - - -  Risk for fall due to: Comment - - - - -    History/P.E. limitations: none  Adult vaccines due  Topic Date Due  . TETANUS/TDAP  09/29/2023  . PNEUMOCOCCAL POLYSACCHARIDE VACCINE AGE 5-64 HIGH RISK  Completed    Diabetes Health Maintenance Due  Topic Date Due  . OPHTHALMOLOGY EXAM  07/08/2017  . HEMOGLOBIN A1C  05/17/2019  . FOOT EXAM  12/29/2019    Health Maintenance Due  Topic Date Due  . OPHTHALMOLOGY EXAM  07/08/2017     Chief Complaint  Patient presents with  . Shoulder Pain  . Diabetes     HPI Right Shoulder Pain Onset: first onset 4-5 years ago after playing with [employer] video game followed by boweling.  Last two weeks there has been an exacerbation of the should pain Location: right shoulder area Quality: aching, throbbing Severity: severe Function: Patient is right handed. Difficulty performing ADLs secondary to pain when lifting right arm.  Unable to work at [employer]  Pattern: intermittent flares; current flare with continuous pain Course: worsening Radiation: no Relief: rest, Ice/heat packs with minimal relief.  APAP some relief.  Precipitant: no recent precipitant known. Associated Symptoms:       Restricted ROM/stiffness/swelling:  yes       Muscle ache/cramp/spasms: no       Color or temperature change: no       Muscle strength change: no       Change in sensation (dysesthesia/itch or numbness): no  Trauma (Acute or Chronic): no acute or chronic overuse lately  Prior Diagnostic Testing or Treatments:  05/23/18 Right shoulder MRI (ordered by Dr Dorcas Mcmurray at [place] [facility]):  1. Severe tendinosis of the supraspinatus tendon with a partial-thickness bursal surface tear. 2. Mild tendinosis of the infraspinatus tendon. 3. Mild tendinosis of the subscapularis tendon.   Relevant PMH/PSH: Patient's follow up with Sports Medicine in May 2019 appears to have been interrupted by a flare of patient's Cyclic vomiting.     CHRONIC DIABETES  Disease Monitoring  Blood Sugar Ranges: 42 - 200  Polyuria: no   Visual problems: no   Medication Compliance: yes, Lantus 22 units [place] qAM  Medication Side Effects  Hypoglycemia: yes, an episode down to 42 in morning that she self resuced with drink.  Symptoms were similar to how she feels when  her "sugars are very high."   [place]       Has not been for eye exam   SH: (+) smoking  Review of Systems No fever No cough No SHOB    Objective:   Physical Exam VS reviewed GEN: Alert, Cooperative, Groomed, Tearing up during painful shoulder exam  Right Shoulder: Inspection reveals no abnormalities, atrophy or asymmetry. right supraspinatus/trapesius TTP AROM abduction limited to 90 degrees secondary to pain. Patient feels click when lowering abducted shoulder along with sense of heaviness in arm PROM abduction limited to 60-90 degree secondary to pain splinting  (+)  signs of impingement with Neer and Hawkin's tests.. (+) empty can   Cor: RRR, no M/G Lungs: BCTA, speaking full sentences, no acc mm use    Assessment & Plan:

## 2019-05-14 NOTE — Assessment & Plan Note (Signed)
Adequate glycemic control.  Pt had hypoglycemic episode to 42 (self rescue). Recommend reduce Lantus from 22 units to 18 units.   Contact office if hypoglycemia occurs again.   Pt ophtho referral [facility] for diabetic eye exam.  Pt seen there in 2017.

## 2019-05-14 NOTE — Assessment & Plan Note (Addendum)
Established problem worsened.  Moderate to severe pain that is impairing [name] [name]'s ability to perform her ADLs and work at [employer].   Working diagnosis is Rotator Cuff tendonopathy exacerbation with know severe supraspinatus tendonosis that had a partial thickness tear on MRI in 04/2018.  Rx Voltaren gel 4 gram QID right shoulder. No NSAIDs given patient's known CKD.  Intraarticular right shoulder injection with 40 mg solumedrol and 4 mL lidocaine 1% wo epi performed today.  Referral for shoulder physician therapy Referral to Dr [name] at [place] [facility] for follow up from consultation 04/2018 consultation.   Patient may have an 5 day earlier refill of her oxycodone this month.

## 2019-05-14 NOTE — Patient Instructions (Signed)
Joint Steroid Injection  A joint steroid injection is a procedure to relieve swelling and pain in a joint. Steroids are medicines that reduce inflammation. In this procedure, your health care provider uses a syringe and a needle to inject a steroid medicine into a painful and inflamed joint. A pain-relieving medicine (anesthetic) may be injected along with the steroid. In some cases, your health care provider may use an imaging technique such as ultrasound or fluoroscopy to guide the injection.  Joints that are often treated with steroid injections include the knee, shoulder, hip, and spine. These injections may also be used in the elbow, ankle, and joints of the hands or feet. You may have joint steroid injections as part of your treatment for inflammation caused by:  Gout.  Rheumatoid arthritis.  Advanced wear-and-tear arthritis (osteoarthritis).  Tendinitis.  Bursitis.  Joint steroid injections may be repeated, but having them too often can damage a joint or the skin over the joint. You should not have joint steroid injections less than 6 weeks apart or more than four times a year.  Tell a health care provider about:  Any allergies you have.  All medicines you are taking, including vitamins, herbs, eye drops, creams, and over-the-counter medicines.  Any problems you or family members have had with anesthetic medicines.  Any blood disorders you have.  Any surgeries you have had.  Any medical conditions you have.  Whether you are pregnant or may be pregnant.  What are the risks?  Generally, this is a safe treatment. However, problems may occur, including:  Infection.  Bleeding.  Allergic reactions to medicines.  Damage to the joint or tissues around the joint.  Thinning of skin or loss of skin color over the joint.  Temporary flushing of the face or chest.  Temporary increase in pain.  Temporary increase in blood sugar.  Failure to relieve inflammation or pain.  What happens before the treatment?  You may have  imaging tests of your joint.  Ask your health care provider about:  Changing or stopping your regular medicines. This is especially important if you are taking diabetes medicines or blood thinners.  Taking medicines such as aspirin and ibuprofen. These medicines can thin your blood. Do not take these medicines unless your health care provider tells you to take them.  Taking over-the-counter medicines, vitamins, herbs, and supplements.  Ask your health care provider if you can drive yourself home after the procedure.  What happens during the treatment?    Your health care provider will position you for the injection and locate the injection site over your joint.  The skin over the joint will be cleaned with a germ-killing soap.  Your health care provider may:  Spray a numbing solution (topical anesthetic) over the injection site.  Inject a local anesthetic under the skin above your joint.  The needle will be placed through your skin into your joint. Your health care provider may use imaging to guide the needle to the right spot for the injection. If imaging is used, a special contrast dye may be injected to confirm that the needle is in the correct location.  The steroid medicine will be injected into your joint.  Anesthetic may be injected along with the steroid. This may be a medicine that relieves pain for a short time (short-acting anesthetic) or for a longer time (long-acting anesthetic).  The needle will be removed, and an adhesive bandage (dressing) will be placed over the injection site.  The procedure may   vary among health care providers and hospitals.  What can I expect after the treatment?  You will be able to go home after the treatment.  It is normal to feel slight flushing for a few days after the injection.  After the treatment, it is common to have an increase in joint pain after the anesthetic has worn off. This may happen about an hour after a short-acting anesthetic or about 8 hours after a  longer-acting anesthetic.  You should begin to feel relief from joint pain and swelling after 24 to 48 hours.  Follow these instructions at home:  Injection site care  Leave the adhesive dressing over your injection site in place until your health care provider says you can remove it.  Check your injection site every day for signs of infection. Check for:  Redness, swelling, or pain.  Fluid or blood.  Warmth.  Pus or a bad smell.  Activity  Return to your normal activities as told by your health care provider. Ask your health care provider what activities are safe for you. You may be asked to limit activities that put stress on the joint for a few days.  Do joint exercises as told by your health care provider.  Do not take baths, swim, or use a hot tub until your health care provider approves.  Managing pain, stiffness, and swelling    If directed, put ice on the joint.  Put ice in a plastic bag.  Place a towel between your skin and the bag.  Leave the ice on for 20 minutes, 2-3 times a day.  Raise (elevate) your joint above the level of your heart when you are sitting or lying down.  General instructions  Take over-the-counter and prescription medicines only as told by your health care provider.  Do not use any products that contain nicotine or tobacco, such as cigarettes, e-cigarettes, and chewing tobacco. These can delay joint healing. If you need help quitting, ask your health care provider.  If you have diabetes, be aware that your blood sugar may be slightly elevated for several days after the injection.  Keep all follow-up visits as told by your health care provider. This is important.  Contact a health care provider if you have:  Chills or a fever.  Any signs of infection at your injection site.  Increased pain or swelling or no relief after 2 days.  Summary  A joint steroid injection is a treatment to relieve pain and swelling in a joint.  Steroids are medicines that reduce inflammation. Your health care  provider may add an anesthetic along with the steroid.  You may have joint steroid injections as part of your arthritis treatment.  Joint steroid injections may be repeated, but having them too often can damage a joint or the skin over the joint.  Contact your health care provider if you have a fever, chills, or signs of infection or if you get no relief from joint pain or swelling.  This information is not intended to replace advice given to you by your health care provider. Make sure you discuss any questions you have with your health care provider.  Document Released: 08/15/2018 Document Revised: 08/15/2018 Document Reviewed: 08/15/2018  [employer]vier Interactive Patient Education © 2019 [employer]

## 2019-05-17 ENCOUNTER — Ambulatory Visit: Payer: 59 | Admitting: Physical Therapy

## 2019-05-22 ENCOUNTER — Other Ambulatory Visit: Payer: Self-pay

## 2019-05-22 ENCOUNTER — Ambulatory Visit: Payer: 59 | Attending: Family Medicine | Admitting: Physical Therapy

## 2019-05-22 ENCOUNTER — Encounter: Payer: Self-pay | Admitting: Physical Therapy

## 2019-05-22 DIAGNOSIS — G8929 Other chronic pain: Secondary | ICD-10-CM

## 2019-05-22 DIAGNOSIS — M25511 Pain in right shoulder: Secondary | ICD-10-CM | POA: Insufficient documentation

## 2019-05-22 DIAGNOSIS — M25611 Stiffness of right shoulder, not elsewhere classified: Secondary | ICD-10-CM

## 2019-05-22 DIAGNOSIS — M6281 Muscle weakness (generalized): Secondary | ICD-10-CM | POA: Diagnosis present

## 2019-05-22 DIAGNOSIS — R600 Localized edema: Secondary | ICD-10-CM | POA: Diagnosis present

## 2019-05-22 NOTE — Therapy (Signed)
[place], [place], [address] Phone: [phone]   Fax:  [phone]  Physical Therapy Evaluation  Patient Details  Name: [name] [name] MRN: [id number] Date of Birth: [birth year] Referring Provider (PT): McDiarmid, Blane Ohara, MD   Encounter Date: 05/22/2019  PT End of Session - 05/22/19 1154    Visit Number  1    Number of Visits  13    Date for PT Re-Evaluation  07/03/19    Authorization Type  MCR: kx mod by 15th visit, progress note with 10th visit    PT Start Time  1144   pt arrived 14 min late   PT Stop Time  1220    PT Time Calculation (min)  36 min    Activity Tolerance  Patient tolerated treatment well    Behavior During Therapy  [facility] for tasks assessed/performed       Past Medical History:  Diagnosis Date  . Abdominal pain, generalized   . Acquired bilateral hammer toes 01/20/2017  . Acute post-traumatic stress disorder 11/17/2018  . Allergy   . Anemia   . Anxiety   . Arthritis   . At risk for polypharmacy 08/15/2014  . Biceps tendinopathy of right upper extremity 04/05/2014  . Blood transfusion 2003  . Cataract    both eyes  . Chronic generalized abdominal pain   . Chronic generalized abdominal pain   . Chronic kidney disease (CKD), stage III (moderate) (HCC)   . Chronic leg cramping 07/18/2014  . CKD (chronic kidney disease) 08/17/2013   Seen by Dr Joelyn Oms 07/28/15 ([place]) - (see scanned document): 20 years diabetes, 15 years HTN, stable CKD stage III with proteinuria.  - CKD, discussed NSAID avoidance, diabetes control with target A1c<7.5%, and HTN control with SBP<140. F/u 6 months and repeat renal panel at that time. - Microalbuminuria: On lisinopril 40mg , suspected secondary due to diabetic nephropathy. - Chronic back pain: No NSDAIDs. - Say amlodipine causes nausea  - Advised not to ACE-i during vomiting episodes    . Complication of anesthesia    "problems waking up"  .  Cyclic vomiting syndrome   . Delayed gastric emptying 05/12/2011    04/12/11 NUCLEAR MEDICINE GASTRIC EMPTYING SCAN Radiopharmaceutical: 2.0 mCi Tc-60m sulfur colloid. Comparison: None. Findings: At 1 hour, 81% of the counts remain in the stomach. At 2 hours, 53% remain. Normal is less than 30% at 2 hours. IMPRESSION: Moderate delay in gastric emptying.      . Depression   . Depression with anxiety 02/23/2007   See [facility] Note dated 05/06/2016.  IC Gwenlyn Saran) was called in and I did a brief assessment.   . Diabetic gastroparesis ([place])    /e-chart  . Diabetic ketoacidosis without coma associated with type 2 diabetes mellitus ([place])   . DKA (diabetic ketoacidoses) ([place]) 08/03/2016  . Dysuria 10/01/2016  . Elevated cholesterol 11/28/2015  . Emotional stress 12/30/2015   Stress after Breakup with partnes of 13 years on 12/27/2015   . Encounter for chronic pain management 01/06/2015   Indication for chronic opioid: Chronic back pain, cyclic vomiting Medication and dose: oxycodone 5mg   # pills per month: 15 Last UDS date: 05/08/2015 - result pending Pain contract signed (Y/N): Yes, reviewed 01/06/2015  Date narcotic database last reviewed (include red flags): 05/08/2015 (no red flags).    . Esophagitis 04/18/2013   Documented by Dr. Henrene Pastor EGD 03/2011  He also consulted on her during hospitalization 29/5188 for cyclical vomiting. No  repeat EGD necessary, treat esophagitis with PPI.    Marland Kitchen Essential hypertension 05/01/2016  . Essential hypertension, malignant 02/23/2007   Jan 2015 - stopped recently-started norvasc due to reports of GI symptoms. 02/2015 - holding lisinopril, adding hydralazine 10mg  TID, and will take 1/2 tab lisinopril when feeling better when not in cyclic vomiting flare   . Essential hypertension, malignant 02/23/2007   Jan 2015 - stopped recently-started norvasc due to reports of GI symptoms. 02/2015 - holding lisinopril, adding hydralazine 10mg  TID, and will take 1/2 tab lisinopril when feeling  better when not in cyclic vomiting flare   . GERD (gastroesophageal reflux disease)   . Heart murmur   . Hematuria 07/09/2015  . High risk social situation 08/31/2012  . History of colonic polyps 11/15/2018   10/2017 screening colonoscopy Dr Scarlette Shorts: Seven polyps were found in the sigmoid colon, transverse colon, ascending colon and cecum. The polyps were 2 to 10 mm in size. These polyps were removed with a cold snare.   Marland Kitchen Hot flashes 07/29/2011  . Hypertension   . Incontinence 10/03/2017  . INSOMNIA NOS 02/23/2007   Qualifier: Diagnosis of  By: Beryle Lathe    . Intermittent constipation 06/06/2015  . Intestinal impaction ([place])   . Intractable vomiting 07/02/2016  . Marijuana smoker, continuous    pt. denies does smoke marijuana  . MIGRAINE, UNSPEC., W/O INTRACTABLE MIGRAINE 02/23/2007   Qualifier: Diagnosis of  By: Beryle Lathe    . Myocardial infarction [facility]) 07/2007   "they say I've had a silent one; I don't know"  . Nausea and vomiting in adult 06/06/2013  . Neuromuscular disorder (HCC)    neuropathy in both legs  . NEUROPATHY, DIABETIC 02/23/2007  . Nonspecific low back pain 04/10/2007   Back pain is chronic. Oxycodone per prior notes helps pt be more active. Reportedly uses 4 tab/week oxy for intermittent worsening of chronic back pain but mostly for cyclic vom to keep her out of hospital.    . Normocytic anemia   . Onychomycosis 03/25/2015  . PANCREATITIS 05/09/2008  . Pneumonia   . Protein-calorie malnutrition, severe ([place]) 12/17/2013  . Pure hypercholesterolemia 08/15/2014   Based on her choleserol, BP, smoker status, and that she is a diabetic, her 10 year ASCVD risk is 9.9% putting her in a category of risk that would benefit from high-intensity statin.    . Rotator cuff syndrome 04/19/2014   rt. shoulder  . Smoker   . TOBACCO DEPENDENCE 02/23/2007   2 cigarettes every few days 11/2012 3 cigarettes daily    . Type II diabetes mellitus ([place])   . Uncontrolled secondary  diabetes mellitus with stage 3 CKD (GFR 30-59) (HCC) 10/23/2011   Nephropathy, gastroparesis  HgbA1c (06/2011) 7.6, 9.5, 12.2, 8.2, 10.9 (07/2012), 9.2 (02/11/2013), 7.7 (09/27/13)  Metformin: she has a history of cyclical vomiting; she would prefer not to take this medication  On lanuts 20U qam with improved control. 09/27/13   . Vitamin D deficiency 11/28/2014    Past Surgical History:  Procedure Laterality Date  . ABDOMINAL HYSTERECTOMY  02/2002  . CARDIAC CATHETERIZATION    . CATARACT EXTRACTION W/ INTRAOCULAR LENS  IMPLANT, BILATERAL    . CESAREAN SECTION     x 3  . CHOLECYSTECTOMY  1980's  . COLONOSCOPY     patient not sure  . DILATION AND EVACUATION  X3  . laparotomy and lysis of adhesions    . left hand surgery    . PORT-A-CATH placement  ~ 2008  right chest; "poor access; frequent hospitalizations"  . SMALL INTESTINE SURGERY     laporotomy with lysis of adhesions.  Marland Kitchen UPPER GASTROINTESTINAL ENDOSCOPY    . UPPER GI ENDOSCOPY      There were no vitals filed for this visit.   Subjective Assessment - 05/22/19 1150    Subjective  pt is a 53 y.o F with CC of R shoulder pain starting about 4-5 years ago which occurred while she was playing the Wii at home and heard pop with significant pain. she reports feeling better today due getting an cortisone injection last week, but reports pain seems to be returning.  pain starts in the shoulder and will refer down the back to the neck and under the shoulder blade.     Limitations  Lifting;House hold activities    How long can you sit comfortably?  unlimited    How long can you stand comfortably?  unlimited    How long can you walk comfortably?  unlimited    Diagnostic tests  MRI,     Patient Stated Goals  to increase strength, participate with grand children fishing, frisbee...etc.    Currently in Pain?  Yes    Pain Score  0-No pain   at worst 10/10   Pain Location  Shoulder    Pain Orientation  Right    Pain Descriptors / Indicators   Aching;Tightness;Tiring    Pain Type  Chronic pain    Pain Radiating Towards  up the neck and down to the shoudler    Pain Onset  More than a month ago    Pain Frequency  Constant    Aggravating Factors   moving the arm around, lifting,     Pain Relieving Factors  hot water bottle         [facility] PT Assessment - 05/22/19 1151      Assessment   Medical Diagnosis  Rotator cuff tendonitis, right , Supraspinatus tear    Referring Provider (PT)  McDiarmid, Blane Ohara, MD    Onset Date/Surgical Date  --   4-5 years ago   Hand Dominance  Right    Next MD Visit  --   4 weeks   Prior Therapy  yes      Precautions   Precautions  Shoulder    Precaution Comments  avoid painting       Restrictions   Weight Bearing Restrictions  No      Balance Screen   Has the patient fallen in the past 6 months  No    Has the patient had a decrease in activity level because of a fear of falling?   No    Is the patient reluctant to leave their home because of a fear of falling?   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[employer], lifting, repetitive movements      Cognition   Overall Cognitive Status  Within Functional  Limits for tasks assessed      Observation/Other Assessments   Focus on Therapeutic Outcomes (FOTO)   48% limited   predicted 38% limited     Posture/Postural Control   Posture/Postural Control  Postural limitations    Postural Limitations  Rounded Shoulders;Forward head      ROM / Strength   AROM / PROM / Strength  AROM;PROM;Strength      AROM   AROM Assessment Site  Shoulder     Right/Left Shoulder  Right;Left    Right Shoulder Extension  60 Degrees   PDM and painful arc noted   Right Shoulder Flexion  146 Degrees   PDM and painful arc noted   Right Shoulder ABduction  92 Degrees   PDM and painful arc noted   Right Shoulder Internal Rotation  62 Degrees   PDM, in neutral position   Right Shoulder External Rotation  42 Degrees   PDM, in neutral position   Left Shoulder Extension  70 Degrees    Left Shoulder Flexion  156 Degrees    Left Shoulder ABduction  130 Degrees    Left Shoulder Internal Rotation  --   T5   Left Shoulder External Rotation  --   T1     PROM   PROM Assessment Site  Shoulder    Right/Left Shoulder  Right      Strength   Overall Strength Comments  didn't test R shoulder due to pain response    Strength Assessment Site  Shoulder;Hand    Right/Left Shoulder  Right;Left    Left Shoulder Flexion  4+/5    Left Shoulder Extension  4+/5    Left Shoulder ABduction  4+/5    Left Shoulder Internal Rotation  4+/5    Left Shoulder External Rotation  4+/5      Palpation   Palpation comment  TTP along the teres minor and infraspinatus, tightness with multiiple trigger points in the upper trap/ levator scapulae.                 Objective measurements completed on examination: See above findings.              PT Education - 05/22/19 1153    Education Details  evaluation findings, POC, goals. HEP with proper form    Person(s) Educated  Patient    Methods  Explanation;Verbal cues;Handout;Demonstration    Comprehension  Verbalized understanding;Verbal cues required;Returned demonstration       PT Short Term Goals - 05/22/19 1230      PT SHORT TERM GOAL #1   Title  pt to be I with inital HEP     Time  3    Period  Weeks    Status  New    Target Date  06/12/19      PT SHORT TERM GOAL #2   Title  pt to verbalize and demo pain relief techniques via RICE and HEP    Time  3    Period  Weeks    Status  New    Target  Date  06/12/19        PT Long Term Goals - 05/22/19 1230      PT LONG TERM GOAL #1   Title  increase R shoulder AROM by >/= 20 degrees in all planes to promote functional ROM with </= 4/10 pain    Time  6    Period  Weeks    Status  New    Target Date  07/03/19      PT LONG TERM GOAL #2   Title  pt to increaes R shoulder strength to >/= 4/5 in all planes to promote stability with </= 4/10 pain     Baseline  unable to test strength due to irritability    Time  6    Period  Weeks    Status  New    Target Date  07/03/19      PT LONG TERM GOAL #3   Title  pt to be able to lift/ lower to and from a over head shelf >/= 8# and push/ pull >/= 15# with </= 4/10 pain for functional lifting     Time  6    Period  Weeks    Status  New    Target Date  07/03/19      PT LONG TERM GOAL #4   Title  Increase FOTO score to </= 36% limited to demo imporovement in funciton     Time  6    Period  Weeks    Status  New    Target Date  07/03/19      PT LONG TERM GOAL #5   Title  pt to be I with all HEP given as of last visit to maintain and progress current level of function     Time  6    Period  Weeks    Status  New    Target Date  07/03/19             Plan - 05/22/19 1155    Clinical Impression Statement  pt presents to OPPT with CC of R shoulder pain starting 4-5 years ago while playing her Wii. limited evaluation due to pt arriving 14 min late and pain/ limtation of R shoulder. strength testing wasn't performed on the R due to pain/ irritability that occurred with AROM assessment. She would benefit from physical therapy to reduce pain, increased shoulder ROM, strength and maximizer her function by addressing the deficits listed below.     Personal Factors and Comorbidities  Age;Comorbidity 3+    Comorbidities  DM, MI, depression with anxieity,     Examination-Activity Limitations  Carry;Lift    Stability/Clinical Decision Making  Unstable/Unpredictable    Clinical Decision Making   High    Rehab Potential  Fair    PT Frequency  2x / week    PT Duration  6 weeks    PT Treatment/Interventions  ADLs/Self Care Home Management;Cryotherapy;Electrical Stimulation;Iontophoresis 4mg /ml Dexamethasone;Ultrasound;Therapeutic exercise;Therapeutic activities;Dry needling;Taping;Manual techniques;Vasopneumatic Device;Patient/family education;Passive range of motion;Neuromuscular re-education    PT Next Visit Plan  review/ update HEP, shoulder PROM/AAROM, mobs, STW, scapular stability, modalites PRN    PT Home Exercise Plan  rows, upper trap stretch, table slides for flexion/ abduction    Consulted and Agree with Plan of Care  Patient       Patient will benefit from skilled therapeutic intervention in order to improve the following deficits and impairments:  Pain, Increased fascial restricitons, Decreased strength, Decreased range of motion, Improper body mechanics, Postural dysfunction, Decreased activity tolerance, Decreased endurance, Increased edema  Visit Diagnosis: Chronic right shoulder pain  Muscle weakness (generalized)  Stiffness of right shoulder, not elsewhere classified  Localized edema     Problem List Patient Active Problem List   Diagnosis Date Noted  . History of multiple colonic tubular adenomas 11/15/2018  . Grief reaction 10/16/2018  . Left ventricular diastolic dysfunction with preserved systolic function 70/35/0093  . Intractable vomiting 08/12/2018  .  Rotator cuff tendonopathies with partial tear, right 03/31/2018  . Hip pain, bilateral 03/31/2018  . Incontinence 10/03/2017  . Healthcare maintenance 09/02/2017  . Stye external 09/02/2017  . Pre-ulcerative calluses 05/27/2017  . Screening for colon cancer 05/27/2017  . Primary income source is welfare benefits 03/10/2017  . Acquired bilateral hammer toes 01/20/2017  . Port-A-Cath in place 01/20/2017  . Secondary hyperparathyroidism of renal origin ([place]) 10/05/2016  . Recurrent major depressive  disorder ([place])   . Essential hypertension 05/01/2016  . Diabetic gastroparesis associated with type 2 diabetes mellitus ([place]) 05/01/2016  . DM (diabetes mellitus), type 2 with complications ([place])   . Hyperkalemia 11/28/2015  . Normocytic anemia   . Intermittent constipation 06/06/2015  . Encounter for monitoring opioid maintenance therapy 01/06/2015  . Vitamin D deficiency 11/28/2014  . Pure hypercholesterolemia 08/15/2014  . Chronic kidney disease (CKD) stage G4/A2, severely decreased glomerular filtration rate (GFR) between 15-29 mL/min/1.73 square meter and albuminuria creatinine ratio between 30-299 mg/g (HCC) 08/17/2013  . Esophagitis determined by endoscopy 04/18/2013  . Allergic rhinitis, seasonal 05/04/2012  . Marijuana smoker ([place]) 11/03/2011  . Cyclic vomiting syndrome 08/03/2011  . Chronic bilateral low back pain 04/10/2007  . Anxiety disorder 02/23/2007  . TOBACCO DEPENDENCE 02/23/2007  . Chronic insomnia secondary to General Medical and Mood disorders 02/23/2007  . DM neuropathies ([place]) 02/23/2007  . GERD with esophagitis 01/27/2004   Starr Lake PT, DPT, LAT, ATC  05/22/19  12:34 PM      [place] [facility] [address] [place], [place], [address] Phone: [phone]   Fax:  [phone]  Name: [name] [name] MRN: [id number] Date of Birth: [birth year]

## 2019-05-29 ENCOUNTER — Encounter: Payer: Self-pay | Admitting: Physical Therapy

## 2019-05-29 ENCOUNTER — Other Ambulatory Visit: Payer: Self-pay

## 2019-05-29 ENCOUNTER — Ambulatory Visit: Payer: 59 | Attending: Family Medicine | Admitting: Physical Therapy

## 2019-05-29 DIAGNOSIS — R600 Localized edema: Secondary | ICD-10-CM | POA: Diagnosis present

## 2019-05-29 DIAGNOSIS — M25511 Pain in right shoulder: Secondary | ICD-10-CM | POA: Diagnosis present

## 2019-05-29 DIAGNOSIS — M6281 Muscle weakness (generalized): Secondary | ICD-10-CM | POA: Insufficient documentation

## 2019-05-29 DIAGNOSIS — M25611 Stiffness of right shoulder, not elsewhere classified: Secondary | ICD-10-CM | POA: Diagnosis present

## 2019-05-29 DIAGNOSIS — G8929 Other chronic pain: Secondary | ICD-10-CM | POA: Diagnosis present

## 2019-05-29 NOTE — Therapy (Signed)
[place], [place], [address] Phone: [phone]   Fax:  [phone]  Physical Therapy Treatment  Patient Details  Name: [name] [name] MRN: [id number] Date of Birth: Mar 14, 1966 Referring Provider (PT): McDiarmid, Blane Ohara, MD   Encounter Date: 05/29/2019  PT End of Session - 05/29/19 1100    Visit Number  2    Number of Visits  13    Date for PT Re-Evaluation  07/03/19    Authorization Type  MCR: kx mod by 15th visit, progress note with 10th visit    PT Start Time  1056    PT Stop Time  1150    PT Time Calculation (min)  54 min       Past Medical History:  Diagnosis Date  . Abdominal pain, generalized   . Acquired bilateral hammer toes 01/20/2017  . Acute post-traumatic stress disorder 11/17/2018  . Allergy   . Anemia   . Anxiety   . Arthritis   . At risk for polypharmacy 08/15/2014  . Biceps tendinopathy of right upper extremity 04/05/2014  . Blood transfusion 2003  . Cataract    both eyes  . Chronic generalized abdominal pain   . Chronic generalized abdominal pain   . Chronic kidney disease (CKD), stage III (moderate) (HCC)   . Chronic leg cramping 07/18/2014  . CKD (chronic kidney disease) 08/17/2013   Seen by Dr Joelyn Oms 07/28/15 ([place]) - (see scanned document): 20 years diabetes, 15 years HTN, stable CKD stage III with proteinuria.  - CKD, discussed NSAID avoidance, diabetes control with target A1c<7.5%, and HTN control with SBP<140. F/u 6 months and repeat renal panel at that time. - Microalbuminuria: On lisinopril 40mg , suspected secondary due to diabetic nephropathy. - Chronic back pain: No NSDAIDs. - Say amlodipine causes nausea  - Advised not to ACE-i during vomiting episodes    . Complication of anesthesia    "problems waking up"  . Cyclic vomiting syndrome   . Delayed gastric emptying 05/12/2011    04/12/11 NUCLEAR MEDICINE GASTRIC EMPTYING SCAN Radiopharmaceutical: 2.0 mCi  Tc-70m sulfur colloid. Comparison: None. Findings: At 1 hour, 81% of the counts remain in the stomach. At 2 hours, 53% remain. Normal is less than 30% at 2 hours. IMPRESSION: Moderate delay in gastric emptying.      . Depression   . Depression with anxiety 02/23/2007   See [facility] Note dated 05/06/2016.  IC Gwenlyn Saran) was called in and I did a brief assessment.   . Diabetic gastroparesis ([place])    /e-chart  . Diabetic ketoacidosis without coma associated with type 2 diabetes mellitus ([place])   . DKA (diabetic ketoacidoses) ([place]) 08/03/2016  . Dysuria 10/01/2016  . Elevated cholesterol 11/28/2015  . Emotional stress 12/30/2015   Stress after Breakup with partnes of 13 years on 12/27/2015   . Encounter for chronic pain management 01/06/2015   Indication for chronic opioid: Chronic back pain, cyclic vomiting Medication and dose: oxycodone 5mg   # pills per month: 15 Last UDS date: 05/08/2015 - result pending Pain contract signed (Y/N): Yes, reviewed 01/06/2015  Date narcotic database last reviewed (include red flags): 05/08/2015 (no red flags).    . Esophagitis 04/18/2013   Documented by Dr. Henrene Pastor EGD 03/2011  He also consulted on her during hospitalization 58/0998 for cyclical vomiting. No repeat EGD necessary, treat esophagitis with PPI.    Marland Kitchen Essential hypertension 05/01/2016  . Essential hypertension, malignant 02/23/2007   Jan 2015 - stopped recently-started  norvasc due to reports of GI symptoms. 02/2015 - holding lisinopril, adding hydralazine 10mg  TID, and will take 1/2 tab lisinopril when feeling better when not in cyclic vomiting flare   . Essential hypertension, malignant 02/23/2007   Jan 2015 - stopped recently-started norvasc due to reports of GI symptoms. 02/2015 - holding lisinopril, adding hydralazine 10mg  TID, and will take 1/2 tab lisinopril when feeling better when not in cyclic vomiting flare   . GERD (gastroesophageal reflux disease)   . Heart murmur   . Hematuria 07/09/2015  . High risk social  situation 08/31/2012  . History of colonic polyps 11/15/2018   10/2017 screening colonoscopy Dr Scarlette Shorts: Seven polyps were found in the sigmoid colon, transverse colon, ascending colon and cecum. The polyps were 2 to 10 mm in size. These polyps were removed with a cold snare.   Marland Kitchen Hot flashes 07/29/2011  . Hypertension   . Incontinence 10/03/2017  . INSOMNIA NOS 02/23/2007   Qualifier: Diagnosis of  By: Beryle Lathe    . Intermittent constipation 06/06/2015  . Intestinal impaction ([place])   . Intractable vomiting 07/02/2016  . Marijuana smoker, continuous    pt. denies does smoke marijuana  . MIGRAINE, UNSPEC., W/O INTRACTABLE MIGRAINE 02/23/2007   Qualifier: Diagnosis of  By: Beryle Lathe    . Myocardial infarction [facility]) 07/2007   "they say I've had a silent one; I don't know"  . Nausea and vomiting in adult 06/06/2013  . Neuromuscular disorder (HCC)    neuropathy in both legs  . NEUROPATHY, DIABETIC 02/23/2007  . Nonspecific low back pain 04/10/2007   Back pain is chronic. Oxycodone per prior notes helps pt be more active. Reportedly uses 4 tab/week oxy for intermittent worsening of chronic back pain but mostly for cyclic vom to keep her out of hospital.    . Normocytic anemia   . Onychomycosis 03/25/2015  . PANCREATITIS 05/09/2008  . Pneumonia   . Protein-calorie malnutrition, severe ([place]) 12/17/2013  . Pure hypercholesterolemia 08/15/2014   Based on her choleserol, BP, smoker status, and that she is a diabetic, her 10 year ASCVD risk is 9.9% putting her in a category of risk that would benefit from high-intensity statin.    . Rotator cuff syndrome 04/19/2014   rt. shoulder  . Smoker   . TOBACCO DEPENDENCE 02/23/2007   2 cigarettes every few days 11/2012 3 cigarettes daily    . Type II diabetes mellitus ([place])   . Uncontrolled secondary diabetes mellitus with stage 3 CKD (GFR 30-59) (HCC) 10/23/2011   Nephropathy, gastroparesis  HgbA1c (06/2011) 7.6, 9.5, 12.2, 8.2, 10.9 (07/2012), 9.2  (02/11/2013), 7.7 (09/27/13)  Metformin: she has a history of cyclical vomiting; she would prefer not to take this medication  On lanuts 20U qam with improved control. 09/27/13   . Vitamin D deficiency 11/28/2014    Past Surgical History:  Procedure Laterality Date  . ABDOMINAL HYSTERECTOMY  02/2002  . CARDIAC CATHETERIZATION    . CATARACT EXTRACTION W/ INTRAOCULAR LENS  IMPLANT, BILATERAL    . CESAREAN SECTION     x 3  . CHOLECYSTECTOMY  1980's  . COLONOSCOPY     patient not sure  . DILATION AND EVACUATION  X3  . laparotomy and lysis of adhesions    . left hand surgery    . PORT-A-CATH placement  ~ 2008   right chest; "poor access; frequent hospitalizations"  . SMALL INTESTINE SURGERY     laporotomy with lysis of adhesions.  Marland Kitchen UPPER GASTROINTESTINAL ENDOSCOPY    .  UPPER GI ENDOSCOPY      There were no vitals filed for this visit.  Subjective Assessment - 05/29/19 1059    Currently in Pain?  Yes    Pain Score  5     Pain Location  Shoulder    Pain Orientation  Right    Pain Descriptors / Indicators  Throbbing;Sharp    Pain Radiating Towards  shoulder blade , neck, upper trap     Aggravating Factors   using arm to wash the shower wall    Pain Relieving Factors  heat                        [facility] Adult PT Treatment/Exercise - 05/29/19 0001      Self-Care   Self-Care  ADL's    ADL's  Donning and doffing shirt to avoid pain , modify chores to avoid increaseing shoulder pain, perform HEP as prescribed       Exercises   Exercises  Shoulder      Shoulder Exercises: Standing   Row  15 reps    Theraband Level (Shoulder Row)  Level 2 (Red)    Row Limitations  cues for posture and activation       Shoulder Exercises: Pulleys   Flexion  1 minute    Flexion Limitations  increased pain       Shoulder Exercises: ROM/Strengthening   Other ROM/Strengthening Exercises  Seated table slides for flexion and abduction x 10 each     Other ROM/Strengthening Exercises   wall ladder x 2, increased pain on second rep so discontinued , standing UE ranger from floor (less than 90 degrees) x 10 into flexion, then from stomach to neutral ER (elbow at side) x 10 - min increased pain.       Modalities   Modalities  Electrical Stimulation;Moist Heat      Moist Heat Therapy   Number Minutes Moist Heat  15 Minutes    Moist Heat Location  Shoulder      Electrical Stimulation   Electrical Stimulation Location  Right shoulder, trap, persicap    Electrical Stimulation Action  IFC x 15 minutes     Electrical Stimulation Parameters  9 mA    Electrical Stimulation Goals  Pain               PT Short Term Goals - 05/22/19 1230      PT SHORT TERM GOAL #1   Title  pt to be I with inital HEP     Time  3    Period  Weeks    Status  New    Target Date  06/12/19      PT SHORT TERM GOAL #2   Title  pt to verbalize and demo pain relief techniques via RICE and HEP    Time  3    Period  Weeks    Status  New    Target Date  06/12/19        PT Long Term Goals - 05/22/19 1230      PT LONG TERM GOAL #1   Title  increase R shoulder AROM by >/= 20 degrees in all planes to promote functional ROM with </= 4/10 pain    Time  6    Period  Weeks    Status  New    Target Date  07/03/19      PT LONG TERM GOAL #2   Title  pt to increaes  R shoulder strength to >/= 4/5 in all planes to promote stability with </= 4/10 pain     Baseline  unable to test strength due to irritability    Time  6    Period  Weeks    Status  New    Target Date  07/03/19      PT LONG TERM GOAL #3   Title  pt to be able to lift/ lower to and from a over head shelf >/= 8# and push/ pull >/= 15# with </= 4/10 pain for functional lifting     Time  6    Period  Weeks    Status  New    Target Date  07/03/19      PT LONG TERM GOAL #4   Title  Increase FOTO score to </= 36% limited to demo imporovement in funciton     Time  6    Period  Weeks    Status  New    Target Date  07/03/19       PT LONG TERM GOAL #5   Title  pt to be I with all HEP given as of last visit to maintain and progress current level of function     Time  6    Period  Weeks    Status  New    Target Date  07/03/19            Plan - 05/29/19 1139    Clinical Impression Statement  Pt arrives reporting increased pain and popping in shoulder with use. She attempted to wash shower wall and clean normally using RUE. Education provided on the need to avoid cauing increased pain and only perform prescribed exercises within comfortable ROM. Reviewed different ways to attempt comfortatble AA/PROM. All therex increased pain. IFC/HMP used to decrease pain at end of session. Self Care provided to decrease pain with donning and doffing shirt. Pt reported decreased pain after IFC.     PT Next Visit Plan  (assess response to IFC- begin IONTO/control pain- continue education- ) -review/ update HEP, shoulder PROM/AAROM, mobs, STW, scapular stability, modalites PRN    PT Home Exercise Plan  rows, upper trap stretch, table slides for flexion/ abduction    Consulted and Agree with Plan of Care  Patient       Patient will benefit from skilled therapeutic intervention in order to improve the following deficits and impairments:  Pain, Increased fascial restricitons, Decreased strength, Decreased range of motion, Improper body mechanics, Postural dysfunction, Decreased activity tolerance, Decreased endurance, Increased edema  Visit Diagnosis: Chronic right shoulder pain  Muscle weakness (generalized)  Stiffness of right shoulder, not elsewhere classified  Localized edema     Problem List Patient Active Problem List   Diagnosis Date Noted  . History of multiple colonic tubular adenomas 11/15/2018  . Grief reaction 10/16/2018  . Left ventricular diastolic dysfunction with preserved systolic function 74/82/7078  . Intractable vomiting 08/12/2018  . Rotator cuff tendonopathies with partial tear, right 03/31/2018  .  Hip pain, bilateral 03/31/2018  . Incontinence 10/03/2017  . Healthcare maintenance 09/02/2017  . Stye external 09/02/2017  . Pre-ulcerative calluses 05/27/2017  . Screening for colon cancer 05/27/2017  . Primary income source is welfare benefits 03/10/2017  . Acquired bilateral hammer toes 01/20/2017  . Port-A-Cath in place 01/20/2017  . Secondary hyperparathyroidism of renal origin ([place]) 10/05/2016  . Recurrent major depressive disorder ([place])   . Essential hypertension 05/01/2016  . Diabetic gastroparesis associated with type 2  diabetes mellitus ([place]) 05/01/2016  . DM (diabetes mellitus), type 2 with complications ([place])   . Hyperkalemia 11/28/2015  . Normocytic anemia   . Intermittent constipation 06/06/2015  . Encounter for monitoring opioid maintenance therapy 01/06/2015  . Vitamin D deficiency 11/28/2014  . Pure hypercholesterolemia 08/15/2014  . Chronic kidney disease (CKD) stage G4/A2, severely decreased glomerular filtration rate (GFR) between 15-29 mL/min/1.73 square meter and albuminuria creatinine ratio between 30-299 mg/g (HCC) 08/17/2013  . Esophagitis determined by endoscopy 04/18/2013  . Allergic rhinitis, seasonal 05/04/2012  . Marijuana smoker ([place]) 11/03/2011  . Cyclic vomiting syndrome 08/03/2011  . Chronic bilateral low back pain 04/10/2007  . Anxiety disorder 02/23/2007  . TOBACCO DEPENDENCE 02/23/2007  . Chronic insomnia secondary to General Medical and Mood disorders 02/23/2007  . DM neuropathies ([place]) 02/23/2007  . GERD with esophagitis 01/27/2004    Dorene Ar, PTA 05/29/2019, 11:57 AM  [facility] [address] [place], [place], [address] Phone: [phone]   Fax:  [phone]  Name: KAEYA SCHIFFER MRN: [id number] Date of Birth: 1966/03/08

## 2019-06-05 ENCOUNTER — Telehealth: Payer: Self-pay | Admitting: Physical Therapy

## 2019-06-05 ENCOUNTER — Ambulatory Visit: Payer: 59 | Admitting: Physical Therapy

## 2019-06-05 NOTE — Telephone Encounter (Signed)
Spoke with pt regarding missed appointment today. She reported she has just been in a lot of pain and completely forgot about her appointment.  I discussed with her when her next appointment was scheduled and if she cannot make that appointment to call us and we can cancel or reschedule that appointment.   She also asked about pain and wanted to potentially get a TENs unit. I told her about potential options and locations that sold TENS units. If she were to get one before he next appointment that we could work together on how to use the unit appropriately, which she said she may get one.

## 2019-06-07 ENCOUNTER — Other Ambulatory Visit: Payer: Self-pay

## 2019-06-07 ENCOUNTER — Encounter: Payer: Self-pay | Admitting: Family Medicine

## 2019-06-07 ENCOUNTER — Ambulatory Visit (INDEPENDENT_AMBULATORY_CARE_PROVIDER_SITE_OTHER): Payer: 59 | Admitting: Family Medicine

## 2019-06-07 VITALS — BP 140/62 | HR 61

## 2019-06-07 DIAGNOSIS — M7581 Other shoulder lesions, right shoulder: Secondary | ICD-10-CM | POA: Diagnosis not present

## 2019-06-07 DIAGNOSIS — Z79891 Long term (current) use of opiate analgesic: Secondary | ICD-10-CM

## 2019-06-07 DIAGNOSIS — R1115 Cyclical vomiting syndrome unrelated to migraine: Secondary | ICD-10-CM

## 2019-06-07 DIAGNOSIS — Z5181 Encounter for therapeutic drug level monitoring: Secondary | ICD-10-CM

## 2019-06-07 DIAGNOSIS — K3184 Gastroparesis: Secondary | ICD-10-CM

## 2019-06-07 DIAGNOSIS — M75101 Unspecified rotator cuff tear or rupture of right shoulder, not specified as traumatic: Secondary | ICD-10-CM

## 2019-06-07 DIAGNOSIS — M12811 Other specific arthropathies, not elsewhere classified, right shoulder: Secondary | ICD-10-CM

## 2019-06-07 DIAGNOSIS — E1143 Type 2 diabetes mellitus with diabetic autonomic (poly)neuropathy: Secondary | ICD-10-CM

## 2019-06-07 MED ORDER — OXYCODONE HCL 5 MG PO TABS: 5.0000 mg | ORAL_TABLET | ORAL | 0 refills | Status: DC | PRN

## 2019-06-07 MED ORDER — CYCLOBENZAPRINE HCL 5 MG PO TABS: 5.0000 mg | ORAL_TABLET | Freq: Three times a day (TID) | ORAL | 1 refills | Status: DC | PRN

## 2019-06-07 NOTE — Patient Instructions (Signed)
We have made a referral to Dr Justice Britain (orthopedist) to evaluate your right shoulder rotator cuff tendonitis with partial tear.    You may increase your prn dose of oxycodone to two tablets in order to do your house work.   You may apply the TENS unit as instructed until you see PT.

## 2019-06-07 NOTE — Assessment & Plan Note (Addendum)
Established problem worsened.  Persisting moderate to severe pain interfering with ADLs and iADLs despite shoulder steroid injection 3 weeks ago and Physical therapy x 2 sessions. Patient requesting to be seen by orthopedist, Dr Justice Britain, who has treated her sister's shoulder disorder successfully.   Referral to Dr Onnie Graham ordered. Rs for cyclobenzaprine 5 mg tablets TID prn - has helped with pain in past.   Rx for TENS unit provied, though I suspect Ms Depree will have to pay OOP. Discussed use of TENS electrodes across right supraspinatus while awaiting formal adjustment and placement recommendations by PT next week.  Continue Voltaren Gel.  The number of oxycodone 5 mg tablets for the month was increased by 15 tablets, now 60 tablets, which are temporary until right shoulder pain has been successfully treated,

## 2019-06-07 NOTE — Progress Notes (Signed)
   Subjective:    Patient ID: [name] [name], female    DOB: 1966/01/07, 53 y.o.   MRN: [id number] [name] [name] is alone Sources of clinical information for visit is/are patient and past medical records. Nursing assessment for this office visit was reviewed with the patient for accuracy and revision.       Adult vaccines due  Topic Date Due  . TETANUS/TDAP  09/29/2023  . PNEUMOCOCCAL POLYSACCHARIDE VACCINE AGE 15-64 HIGH RISK  Completed    Diabetes Health Maintenance Due  Topic Date Due  . OPHTHALMOLOGY EXAM  07/08/2017  . HEMOGLOBIN A1C  11/14/2019  . FOOT EXAM  12/29/2019    Health Maintenance Due  Topic Date Due  . OPHTHALMOLOGY EXAM  07/08/2017     Chief Complaint  Patient presents with  . Shoulder Pain     HPI  Right shoulder pain - Right-handed - Persistent right moderate to severe right shoulder pain  - Unable to perform self-hygiene bc of  pain -  Unable to perform needed housework bc of pain - Pain is of throbbing quality that is worse at night interefering with sleep - Gets some relief if she takes two oxycodone 5 mg tablets at a time.  The oxycodone is primarily for Gastroparesis flares prn.  - prior MRI shoser rotator cuff tendonopathy and supraspinatus partial teat. - Patient has participated in PT - she missed one session bc of transportation pxs.  - Right shoulder subacromial corticosteroid injection on 5/16 did not help with pain  - Patient would like to try TENS unit to shoulder - Flexeril has helped with muscle spasms pain in past, though 10 mg tablet made her sleepy.  - Pain currently 6/10, it is 7-8 out of 10 when it flares, oxycodone 10 mg decreases to 3-4 out of ten.   SH: Smoking (+) Review of Systems No fever/Cough No shortness of breath    Objective:   Physical Exam Vitals:   06/07/19 0956  BP: 140/62  Pulse: 61  SpO2: 98%   Right Shoulder: Inspection reveals no abnormalities, atrophy or asymmetry. Patient guarding Palpation is  normal with diffuse rotator cuff muscles tenderness AROM is full in all planes. PROM is full in all planes Rotator cuff strength normal throughout. (+) signs of impingement with (+) Neer and Hawkin's tests.. (+) painful arc and equivocal drop arm sign.    Assessment & Plan:  Visit Problem List with A/P  Rotator cuff tendonopathies with partial tear, right Established problem worsened.  Persisting moderate to severe pain interfering with ADLs and iADLs despite shoulder steroid injection 3 weeks ago and Physical therapy x 2 sessions. Patient requesting to be seen by orthopedist, Dr Justice Britain, who has treated her sister's shoulder disorder successfully.   Referral to Dr Onnie Graham ordered.  Rx for TENS unit provied, though I suspect [name] will have to pay OOP. Discussed use of TENS electrodes across right supraspinatus while awaiting formal adjustment and placement recommendations by PT next week.  Continue Voltaren Gel.  The number of oxycodone 5 mg tablets for the month was increased by 15 tablets, now 60 tablets, which are temporary until right shoulder pain has been successfully treated,   Visit took 25 minutes in which greater than 50% was counseling about changes in medications, adverse effects, referral process, coaching about TENS unit.

## 2019-06-07 NOTE — Assessment & Plan Note (Signed)
Right shoulder pain causing more frequent of cyclic vomiting symptoms to flare. Will increase monthly oxycodone 5 mg tablet to 60 tablets a month temporarily to provide debilitating right shoulder pain from rotator cuff tear and tendonopathies while awaiting evaluation and treatment at [facility].  Will revert to 45 tablets a month once shoulder disorder resolved.   Review of [place] CSRS showed no evidenc of doctor shopping.  There have been no displays of aberrant behaviors.  No adverse effects from the opioid therapy and the analgesia provided is adequate for patient to  address her ADLs and iADLs though it takes longer and takes more effort.

## 2019-06-12 ENCOUNTER — Encounter: Payer: Self-pay | Admitting: Physical Therapy

## 2019-06-12 ENCOUNTER — Other Ambulatory Visit: Payer: Self-pay

## 2019-06-12 ENCOUNTER — Ambulatory Visit: Payer: 59 | Admitting: Physical Therapy

## 2019-06-12 DIAGNOSIS — M6281 Muscle weakness (generalized): Secondary | ICD-10-CM

## 2019-06-12 DIAGNOSIS — M25511 Pain in right shoulder: Secondary | ICD-10-CM | POA: Diagnosis not present

## 2019-06-12 DIAGNOSIS — G8929 Other chronic pain: Secondary | ICD-10-CM

## 2019-06-12 DIAGNOSIS — M25611 Stiffness of right shoulder, not elsewhere classified: Secondary | ICD-10-CM

## 2019-06-12 DIAGNOSIS — R600 Localized edema: Secondary | ICD-10-CM

## 2019-06-12 NOTE — Therapy (Signed)
[place], [place], [address] Phone: [phone]   Fax:  [phone]  Physical Therapy Treatment  Patient Details  Name: [name] [name] MRN: [id number] Date of Birth: 09-Jan-1966 Referring Provider (PT): McDiarmid, Blane Ohara, MD   Encounter Date: 06/12/2019  PT End of Session - 06/12/19 1206    Visit Number  4    Number of Visits  13    Date for PT Re-Evaluation  07/03/19    Authorization Type  MCR: kx mod by 15th visit, progress note with 10th visit    PT Start Time  1129    PT Stop Time  1219    PT Time Calculation (min)  50 min    Activity Tolerance  Patient tolerated treatment well    Behavior During Therapy  [facility] for tasks assessed/performed       Past Medical History:  Diagnosis Date  . Abdominal pain, generalized   . Acquired bilateral hammer toes 01/20/2017  . Acute post-traumatic stress disorder 11/17/2018  . Allergy   . Anemia   . Anxiety   . Arthritis   . At risk for polypharmacy 08/15/2014  . Biceps tendinopathy of right upper extremity 04/05/2014  . Blood transfusion 2003  . Cataract    both eyes  . Chronic generalized abdominal pain   . Chronic generalized abdominal pain   . Chronic kidney disease (CKD), stage III (moderate) (HCC)   . Chronic leg cramping 07/18/2014  . CKD (chronic kidney disease) 08/17/2013   Seen by Dr Joelyn Oms 07/28/15 ([place]) - (see scanned document): 20 years diabetes, 15 years HTN, stable CKD stage III with proteinuria.  - CKD, discussed NSAID avoidance, diabetes control with target A1c<7.5%, and HTN control with SBP<140. F/u 6 months and repeat renal panel at that time. - Microalbuminuria: On lisinopril 40mg , suspected secondary due to diabetic nephropathy. - Chronic back pain: No NSDAIDs. - Say amlodipine causes nausea  - Advised not to ACE-i during vomiting episodes    . Complication of anesthesia    "problems waking up"  . Cyclic vomiting syndrome    . Delayed gastric emptying 05/12/2011    04/12/11 NUCLEAR MEDICINE GASTRIC EMPTYING SCAN Radiopharmaceutical: 2.0 mCi Tc-75m sulfur colloid. Comparison: None. Findings: At 1 hour, 81% of the counts remain in the stomach. At 2 hours, 53% remain. Normal is less than 30% at 2 hours. IMPRESSION: Moderate delay in gastric emptying.      . Depression   . Depression with anxiety 02/23/2007   See [facility] Note dated 05/06/2016.  IC Gwenlyn Saran) was called in and I did a brief assessment.   . Diabetic gastroparesis ([place])    /e-chart  . Diabetic ketoacidosis without coma associated with type 2 diabetes mellitus ([place])   . DKA (diabetic ketoacidoses) ([place]) 08/03/2016  . Dysuria 10/01/2016  . Elevated cholesterol 11/28/2015  . Emotional stress 12/30/2015   Stress after Breakup with partnes of 13 years on 12/27/2015   . Encounter for chronic pain management 01/06/2015   Indication for chronic opioid: Chronic back pain, cyclic vomiting Medication and dose: oxycodone 5mg   # pills per month: 15 Last UDS date: 05/08/2015 - result pending Pain contract signed (Y/N): Yes, reviewed 01/06/2015  Date narcotic database last reviewed (include red flags): 05/08/2015 (no red flags).    . Esophagitis 04/18/2013   Documented by Dr. Henrene Pastor EGD 03/2011  He also consulted on her during hospitalization 37/3428 for cyclical vomiting. No repeat EGD necessary, treat esophagitis with  PPI.    . Essential hypertension 05/01/2016  . Essential hypertension, malignant 02/23/2007   Jan 2015 - stopped recently-started norvasc due to reports of GI symptoms. 02/2015 - holding lisinopril, adding hydralazine 10mg  TID, and will take 1/2 tab lisinopril when feeling better when not in cyclic vomiting flare   . Essential hypertension, malignant 02/23/2007   Jan 2015 - stopped recently-started norvasc due to reports of GI symptoms. 02/2015 - holding lisinopril, adding hydralazine 10mg  TID, and will take 1/2 tab lisinopril when feeling better when not in cyclic  vomiting flare   . GERD (gastroesophageal reflux disease)   . Heart murmur   . Hematuria 07/09/2015  . High risk social situation 08/31/2012  . History of colonic polyps 11/15/2018   10/2017 screening colonoscopy Dr Scarlette Shorts: Seven polyps were found in the sigmoid colon, transverse colon, ascending colon and cecum. The polyps were 2 to 10 mm in size. These polyps were removed with a cold snare.   Marland Kitchen Hot flashes 07/29/2011  . Hypertension   . Incontinence 10/03/2017  . INSOMNIA NOS 02/23/2007   Qualifier: Diagnosis of  By: Beryle Lathe    . Intermittent constipation 06/06/2015  . Intestinal impaction ([place])   . Intractable vomiting 07/02/2016  . Marijuana smoker, continuous    pt. denies does smoke marijuana  . MIGRAINE, UNSPEC., W/O INTRACTABLE MIGRAINE 02/23/2007   Qualifier: Diagnosis of  By: Beryle Lathe    . Myocardial infarction [facility]) 07/2007   "they say I've had a silent one; I don't know"  . Nausea and vomiting in adult 06/06/2013  . Neuromuscular disorder (HCC)    neuropathy in both legs  . NEUROPATHY, DIABETIC 02/23/2007  . Nonspecific low back pain 04/10/2007   Back pain is chronic. Oxycodone per prior notes helps pt be more active. Reportedly uses 4 tab/week oxy for intermittent worsening of chronic back pain but mostly for cyclic vom to keep her out of hospital.    . Normocytic anemia   . Onychomycosis 03/25/2015  . PANCREATITIS 05/09/2008  . Pneumonia   . Protein-calorie malnutrition, severe ([place]) 12/17/2013  . Pure hypercholesterolemia 08/15/2014   Based on her choleserol, BP, smoker status, and that she is a diabetic, her 10 year ASCVD risk is 9.9% putting her in a category of risk that would benefit from high-intensity statin.    . Rotator cuff syndrome 04/19/2014   rt. shoulder  . Smoker   . TOBACCO DEPENDENCE 02/23/2007   2 cigarettes every few days 11/2012 3 cigarettes daily    . Type II diabetes mellitus ([place])   . Uncontrolled secondary diabetes mellitus with stage 3  CKD (GFR 30-59) (HCC) 10/23/2011   Nephropathy, gastroparesis  HgbA1c (06/2011) 7.6, 9.5, 12.2, 8.2, 10.9 (07/2012), 9.2 (02/11/2013), 7.7 (09/27/13)  Metformin: she has a history of cyclical vomiting; she would prefer not to take this medication  On lanuts 20U qam with improved control. 09/27/13   . Vitamin D deficiency 11/28/2014    Past Surgical History:  Procedure Laterality Date  . ABDOMINAL HYSTERECTOMY  02/2002  . CARDIAC CATHETERIZATION    . CATARACT EXTRACTION W/ INTRAOCULAR LENS  IMPLANT, BILATERAL    . CESAREAN SECTION     x 3  . CHOLECYSTECTOMY  1980's  . COLONOSCOPY     patient not sure  . DILATION AND EVACUATION  X3  . laparotomy and lysis of adhesions    . left hand surgery    . PORT-A-CATH placement  ~ 2008   right chest; "poor access;  frequent hospitalizations"  . SMALL INTESTINE SURGERY     laporotomy with lysis of adhesions.  Marland Kitchen UPPER GASTROINTESTINAL ENDOSCOPY    . UPPER GI ENDOSCOPY      There were no vitals filed for this visit.  Subjective Assessment - 06/12/19 1136    Subjective  " I am only having alittle bit of soreness at 4/10, I have been doing the exercises at home and slideing foward"    Currently in Pain?  Yes    Pain Score  4     Pain Location  Shoulder    Pain Orientation  Right    Pain Descriptors / Indicators  Sore    Aggravating Factors   using the arm    Pain Relieving Factors  heat         [facility] PT Assessment - 06/12/19 0001      Assessment   Medical Diagnosis  Rotator cuff tendonitis, right , Supraspinatus tear    Referring Provider (PT)  McDiarmid, Blane Ohara, MD                   [facility] Adult PT Treatment/Exercise - 06/12/19 0001      Shoulder Exercises: ROM/Strengthening   Other ROM/Strengthening Exercises  Seated table slides for flexion and abduction x 10 each    reviewed how to avoid pain and proper form     Shoulder Exercises: Isometric Strengthening   Flexion  5X10"    Extension  5X10"    External Rotation   5X10"    Internal Rotation  5X10"      Moist Heat Therapy   Number Minutes Moist Heat  10 Minutes    Moist Heat Location  Shoulder   in sitting     Electrical Stimulation   Electrical Stimulation Location  Right shoulder, trap, persicap    Electrical Stimulation Action  IFC    Electrical Stimulation Parameters  10 min, L12, 100% scan    Electrical Stimulation Goals  Pain      Manual Therapy   Manual Therapy  Passive ROM;Joint mobilization    Joint Mobilization  grade I pa/ AP and inferior glide   for pain   Passive ROM  shoulder flexion/ abduction working into end ranges with gentle distraction osciallations             PT Education - 06/12/19 1205    Education Details  reviewed HEP and proper form with table slides, updated HEP for isometrics    Person(s) Educated  Patient    Methods  Explanation;Verbal cues;Handout    Comprehension  Verbalized understanding;Verbal cues required       PT Short Term Goals - 05/22/19 1230      PT SHORT TERM GOAL #1   Title  pt to be I with inital HEP     Time  3    Period  Weeks    Status  New    Target Date  06/12/19      PT SHORT TERM GOAL #2   Title  pt to verbalize and demo pain relief techniques via RICE and HEP    Time  3    Period  Weeks    Status  New    Target Date  06/12/19        PT Long Term Goals - 05/22/19 1230      PT LONG TERM GOAL #1   Title  increase R shoulder AROM by >/= 20 degrees in all planes to promote functional  ROM with </= 4/10 pain    Time  6    Period  Weeks    Status  New    Target Date  07/03/19      PT LONG TERM GOAL #2   Title  pt to increaes R shoulder strength to >/= 4/5 in all planes to promote stability with </= 4/10 pain     Baseline  unable to test strength due to irritability    Time  6    Period  Weeks    Status  New    Target Date  07/03/19      PT LONG TERM GOAL #3   Title  pt to be able to lift/ lower to and from a over head shelf >/= 8# and push/ pull >/= 15# with </=  4/10 pain for functional lifting     Time  6    Period  Weeks    Status  New    Target Date  07/03/19      PT LONG TERM GOAL #4   Title  Increase FOTO score to </= 36% limited to demo imporovement in funciton     Time  6    Period  Weeks    Status  New    Target Date  07/03/19      PT LONG TERM GOAL #5   Title  pt to be I with all HEP given as of last visit to maintain and progress current level of function     Time  6    Period  Weeks    Status  New    Target Date  07/03/19            Plan - 06/12/19 1211    Clinical Impression Statement  pt continues to demonstrate increased soreness in the shoulder with guarding noted. Reviewed previously provided HEp and how to perform appropriately at home. added Isometrics to HEP which she did well with. continued MHP and e-stim to calm down soreness.    PT Next Visit Plan  (assess response to IFC- begin IONTO/control pain- continue education- ) -review/ update HEP, shoulder PROM/AAROM, mobs, STW, scapular stability, modalites PRN    PT Home Exercise Plan  rows, upper trap stretch, table slides for flexion/ abduction       Patient will benefit from skilled therapeutic intervention in order to improve the following deficits and impairments:  Pain, Increased fascial restricitons, Decreased strength, Decreased range of motion, Improper body mechanics, Postural dysfunction, Decreased activity tolerance, Decreased endurance, Increased edema  Visit Diagnosis: 1. Chronic right shoulder pain   2. Muscle weakness (generalized)   3. Stiffness of right shoulder, not elsewhere classified   4. Localized edema        Problem List Patient Active Problem List   Diagnosis Date Noted  . History of multiple colonic tubular adenomas 11/15/2018  . Grief reaction 10/16/2018  . Left ventricular diastolic dysfunction with preserved systolic function 51/70/0174  . Intractable vomiting 08/12/2018  . Rotator cuff tendonopathies with partial tear,  right 03/31/2018  . Hip pain, bilateral 03/31/2018  . Incontinence 10/03/2017  . Healthcare maintenance 09/02/2017  . Stye external 09/02/2017  . Pre-ulcerative calluses 05/27/2017  . Screening for colon cancer 05/27/2017  . Primary income source is welfare benefits 03/10/2017  . Acquired bilateral hammer toes 01/20/2017  . Port-A-Cath in place 01/20/2017  . Secondary hyperparathyroidism of renal origin ([place]) 10/05/2016  . Recurrent major depressive disorder ([place])   . Essential hypertension 05/01/2016  .  Diabetic gastroparesis associated with type 2 diabetes mellitus ([place]) 05/01/2016  . DM (diabetes mellitus), type 2 with complications ([place])   . Hyperkalemia 11/28/2015  . Normocytic anemia   . Intermittent constipation 06/06/2015  . Encounter for monitoring opioid maintenance therapy 01/06/2015  . Vitamin D deficiency 11/28/2014  . Pure hypercholesterolemia 08/15/2014  . Chronic kidney disease (CKD) stage G4/A2, severely decreased glomerular filtration rate (GFR) between 15-29 mL/min/1.73 square meter and albuminuria creatinine ratio between 30-299 mg/g (HCC) 08/17/2013  . Esophagitis determined by endoscopy 04/18/2013  . Allergic rhinitis, seasonal 05/04/2012  . Marijuana smoker ([place]) 11/03/2011  . Cyclic vomiting syndrome 08/03/2011  . Chronic bilateral low back pain 04/10/2007  . Anxiety disorder 02/23/2007  . TOBACCO DEPENDENCE 02/23/2007  . Chronic insomnia secondary to General Medical and Mood disorders 02/23/2007  . DM neuropathies ([place]) 02/23/2007  . GERD with esophagitis 01/27/2004   Starr Lake PT, DPT, LAT, ATC  06/12/19  12:14 PM      [facility] [address] [place], [place], [address] Phone: [phone]   Fax:  [phone]  Name: [name] [name] MRN: [id number] Date of Birth: 1966-08-31

## 2019-06-19 ENCOUNTER — Ambulatory Visit: Payer: 59 | Admitting: Physical Therapy

## 2019-06-19 ENCOUNTER — Other Ambulatory Visit: Payer: Self-pay

## 2019-06-19 ENCOUNTER — Encounter: Payer: Self-pay | Admitting: Physical Therapy

## 2019-06-19 DIAGNOSIS — R600 Localized edema: Secondary | ICD-10-CM

## 2019-06-19 DIAGNOSIS — M25511 Pain in right shoulder: Secondary | ICD-10-CM

## 2019-06-19 DIAGNOSIS — G8929 Other chronic pain: Secondary | ICD-10-CM

## 2019-06-19 DIAGNOSIS — M6281 Muscle weakness (generalized): Secondary | ICD-10-CM

## 2019-06-19 DIAGNOSIS — M25611 Stiffness of right shoulder, not elsewhere classified: Secondary | ICD-10-CM

## 2019-06-19 NOTE — Therapy (Signed)
[place], [place], [address] Phone: [phone]   Fax:  [phone]  Physical Therapy Treatment / Discharge Summary  Patient Details  Name: [name] [name] MRN: [id number] Date of Birth: [birth year] Referring Provider (PT): McDiarmid, Blane Ohara, MD   Encounter Date: 06/19/2019  PT End of Session - 06/19/19 1120    Visit Number  5    Number of Visits  13    Date for PT Re-Evaluation  07/03/19    Authorization Type  MCR: kx mod by 15th visit, progress note with 10th visit    PT Start Time  1120    PT Stop Time  1144    PT Time Calculation (min)  24 min    Activity Tolerance  Patient tolerated treatment well       Past Medical History:  Diagnosis Date  . Abdominal pain, generalized   . Acquired bilateral hammer toes 01/20/2017  . Acute post-traumatic stress disorder 11/17/2018  . Allergy   . Anemia   . Anxiety   . Arthritis   . At risk for polypharmacy 08/15/2014  . Biceps tendinopathy of right upper extremity 04/05/2014  . Blood transfusion 2003  . Cataract    both eyes  . Chronic generalized abdominal pain   . Chronic generalized abdominal pain   . Chronic kidney disease (CKD), stage III (moderate) (HCC)   . Chronic leg cramping 07/18/2014  . CKD (chronic kidney disease) 08/17/2013   Seen by Dr Joelyn Oms 07/28/15 ([place]) - (see scanned document): 20 years diabetes, 15 years HTN, stable CKD stage III with proteinuria.  - CKD, discussed NSAID avoidance, diabetes control with target A1c<7.5%, and HTN control with SBP<140. F/u 6 months and repeat renal panel at that time. - Microalbuminuria: On lisinopril 580m, suspected secondary due to diabetic nephropathy. - Chronic back pain: No NSDAIDs. - Say amlodipine causes nausea  - Advised not to ACE-i during vomiting episodes    . Complication of anesthesia    "problems waking up"  . Cyclic vomiting syndrome   . Delayed gastric emptying 05/12/2011     04/12/11 NUCLEAR MEDICINE GASTRIC EMPTYING SCAN Radiopharmaceutical: 2.0 mCi Tc-92mulfur colloid. Comparison: None. Findings: At 1 hour, 81% of the counts remain in the stomach. At 2 hours, 53% remain. Normal is less than 30% at 2 hours. IMPRESSION: Moderate delay in gastric emptying.      . Depression   . Depression with anxiety 02/23/2007   See [facility] dated 05/06/2016.  IC (KGwenlyn Saranwas called in and I did a brief assessment.   . Diabetic [place]roparesis ([facility]   /e-chart  . Diabetic ketoacidosis without coma associated with type 2 diabetes mellitus ([facility]  . DKA (diabetic ketoacidoses) ([facility]/07/2016  . Dysuria 10/01/2016  . Elevated cholesterol 11/28/2015  . Emotional stress 12/30/2015   Stress after Breakup with partnes of 13 years on 12/27/2015   . Encounter for chronic pain management 01/06/2015   Indication for chronic opioid: Chronic back pain, cyclic vomiting Medication and dose: oxycodone 80m82m# pills per month: 15 Last UDS date: 05/08/2015 - result pending Pain contract signed (Y/N): Yes, reviewed 01/06/2015  Date narcotic database last reviewed (include red flags): 05/08/2015 (no red flags).    . Esophagitis 04/18/2013   Documented by Dr. PerHenrene PastorD 03/2011  He also consulted on her during hospitalization 03/58/0998r cyclical vomiting. No repeat EGD necessary, treat esophagitis with PPI.    . EMarland Kitchensential hypertension 05/01/2016  .  [place], [place], [address] Phone: [phone]   Fax:  [phone]  Physical Therapy Treatment / Discharge Summary  Patient Details  Name: [name] [name] MRN: [id number] Date of Birth: [birth year] Referring Provider (PT): McDiarmid, Blane Ohara, MD   Encounter Date: 06/19/2019  PT End of Session - 06/19/19 1120    Visit Number  5    Number of Visits  13    Date for PT Re-Evaluation  07/03/19    Authorization Type  MCR: kx mod by 15th visit, progress note with 10th visit    PT Start Time  1120    PT Stop Time  1144    PT Time Calculation (min)  24 min    Activity Tolerance  Patient tolerated treatment well       Past Medical History:  Diagnosis Date  . Abdominal pain, generalized   . Acquired bilateral hammer toes 01/20/2017  . Acute post-traumatic stress disorder 11/17/2018  . Allergy   . Anemia   . Anxiety   . Arthritis   . At risk for polypharmacy 08/15/2014  . Biceps tendinopathy of right upper extremity 04/05/2014  . Blood transfusion 2003  . Cataract    both eyes  . Chronic generalized abdominal pain   . Chronic generalized abdominal pain   . Chronic kidney disease (CKD), stage III (moderate) (HCC)   . Chronic leg cramping 07/18/2014  . CKD (chronic kidney disease) 08/17/2013   Seen by Dr Joelyn Oms 07/28/15 ([facility]) - (see scanned document): 20 years diabetes, 15 years HTN, stable CKD stage III with proteinuria.  - CKD, discussed NSAID avoidance, diabetes control with target A1c<7.5%, and HTN control with SBP<140. F/u 6 months and repeat renal panel at that time. - Microalbuminuria: On lisinopril 580m, suspected secondary due to diabetic nephropathy. - Chronic back pain: No NSDAIDs. - Say amlodipine causes nausea  - Advised not to ACE-i during vomiting episodes    . Complication of anesthesia    "problems waking up"  . Cyclic vomiting syndrome   . Delayed gastric emptying 05/12/2011     04/12/11 NUCLEAR MEDICINE GASTRIC EMPTYING SCAN Radiopharmaceutical: 2.0 mCi Tc-92mulfur colloid. Comparison: None. Findings: At 1 hour, 81% of the counts remain in the stomach. At 2 hours, 53% remain. Normal is less than 30% at 2 hours. IMPRESSION: Moderate delay in gastric emptying.      . Depression   . Depression with anxiety 02/23/2007   See [facility] Note dated 05/06/2016.  IC (KGwenlyn Saranwas called in and I did a brief assessment.   . Diabetic [place]roparesis ([facility]   /e-[place]  . Diabetic ketoacidosis without coma associated with type 2 diabetes mellitus ([facility]  . DKA (diabetic ketoacidoses) ([facility]/07/2016  . Dysuria 10/01/2016  . Elevated cholesterol 11/28/2015  . Emotional stress 12/30/2015   Stress after Breakup with partnes of 13 years on 12/27/2015   . Encounter for chronic pain management 01/06/2015   Indication for chronic opioid: Chronic back pain, cyclic vomiting Medication and dose: oxycodone 80m82m# pills per month: 15 Last UDS date: 05/08/2015 - result pending Pain contract signed (Y/N): Yes, reviewed 01/06/2015  Date narcotic database last reviewed (include red flags): 05/08/2015 (no red flags).    . Esophagitis 04/18/2013   Documented by Dr. PerHenrene PastorD 03/2011  He also consulted on her during hospitalization 03/58/0998r cyclical vomiting. No repeat EGD necessary, treat esophagitis with PPI.    . EMarland Kitchensential hypertension 05/01/2016  .  [place], [place], [address] Phone: [phone]   Fax:  [phone]  Physical Therapy Treatment / Discharge Summary  Patient Details  Name: [name] [name] MRN: [id number] Date of Birth: [birth year] Referring Provider (PT): McDiarmid, Blane Ohara, MD   Encounter Date: 06/19/2019  PT End of Session - 06/19/19 1120    Visit Number  5    Number of Visits  13    Date for PT Re-Evaluation  07/03/19    Authorization Type  MCR: kx mod by 15th visit, progress note with 10th visit    PT Start Time  1120    PT Stop Time  1144    PT Time Calculation (min)  24 min    Activity Tolerance  Patient tolerated treatment well       Past Medical History:  Diagnosis Date  . Abdominal pain, generalized   . Acquired bilateral hammer toes 01/20/2017  . Acute post-traumatic stress disorder 11/17/2018  . Allergy   . Anemia   . Anxiety   . Arthritis   . At risk for polypharmacy 08/15/2014  . Biceps tendinopathy of right upper extremity 04/05/2014  . Blood transfusion 2003  . Cataract    both eyes  . Chronic generalized abdominal pain   . Chronic generalized abdominal pain   . Chronic kidney disease (CKD), stage III (moderate) (HCC)   . Chronic leg cramping 07/18/2014  . CKD (chronic kidney disease) 08/17/2013   Seen by Dr Joelyn Oms 07/28/15 ([place]) - (see scanned document): 20 years diabetes, 15 years HTN, stable CKD stage III with proteinuria.  - CKD, discussed NSAID avoidance, diabetes control with target A1c<7.5%, and HTN control with SBP<140. F/u 6 months and repeat renal panel at that time. - Microalbuminuria: On lisinopril 580m, suspected secondary due to diabetic nephropathy. - Chronic back pain: No NSDAIDs. - Say amlodipine causes nausea  - Advised not to ACE-i during vomiting episodes    . Complication of anesthesia    "problems waking up"  . Cyclic vomiting syndrome   . Delayed gastric emptying 05/12/2011     04/12/11 NUCLEAR MEDICINE GASTRIC EMPTYING SCAN Radiopharmaceutical: 2.0 mCi Tc-92mulfur colloid. Comparison: None. Findings: At 1 hour, 81% of the counts remain in the stomach. At 2 hours, 53% remain. Normal is less than 30% at 2 hours. IMPRESSION: Moderate delay in gastric emptying.      . Depression   . Depression with anxiety 02/23/2007   See [facility] dated 05/06/2016.  IC (KGwenlyn Saranwas called in and I did a brief assessment.   . Diabetic gastroparesis ([facility]   /e-chart  . Diabetic ketoacidosis without coma associated with type 2 diabetes mellitus ([facility]  . DKA (diabetic ketoacidoses) ([facility]/07/2016  . Dysuria 10/01/2016  . Elevated cholesterol 11/28/2015  . Emotional stress 12/30/2015   Stress after Breakup with partnes of 13 years on 12/27/2015   . Encounter for chronic pain management 01/06/2015   Indication for chronic opioid: Chronic back pain, cyclic vomiting Medication and dose: oxycodone 80m82m# pills per month: 15 Last UDS date: 05/08/2015 - result pending Pain contract signed (Y/N): Yes, reviewed 01/06/2015  Date narcotic database last reviewed (include red flags): 05/08/2015 (no red flags).    . Esophagitis 04/18/2013   Documented by Dr. PerHenrene PastorD 03/2011  He also consulted on her during hospitalization 03/58/0998r cyclical vomiting. No repeat EGD necessary, treat esophagitis with PPI.    . EMarland Kitchensential hypertension 05/01/2016  .  [place], [place], [address] Phone: [phone]   Fax:  [phone]  Physical Therapy Treatment / Discharge Summary  Patient Details  Name: [name] [name] MRN: [id number] Date of Birth: [birth year] Referring Provider (PT): McDiarmid, Blane Ohara, MD   Encounter Date: 06/19/2019  PT End of Session - 06/19/19 1120    Visit Number  5    Number of Visits  13    Date for PT Re-Evaluation  07/03/19    Authorization Type  MCR: kx mod by 15th visit, progress note with 10th visit    PT Start Time  1120    PT Stop Time  1144    PT Time Calculation (min)  24 min    Activity Tolerance  Patient tolerated treatment well       Past Medical History:  Diagnosis Date  . Abdominal pain, generalized   . Acquired bilateral hammer toes 01/20/2017  . Acute post-traumatic stress disorder 11/17/2018  . Allergy   . Anemia   . Anxiety   . Arthritis   . At risk for polypharmacy 08/15/2014  . Biceps tendinopathy of right upper extremity 04/05/2014  . Blood transfusion 2003  . Cataract    both eyes  . Chronic generalized abdominal pain   . Chronic generalized abdominal pain   . Chronic kidney disease (CKD), stage III (moderate) (HCC)   . Chronic leg cramping 07/18/2014  . CKD (chronic kidney disease) 08/17/2013   Seen by Dr Joelyn Oms 07/28/15 ([facility]) - (see scanned document): 20 years diabetes, 15 years HTN, stable CKD stage III with proteinuria.  - CKD, discussed NSAID avoidance, diabetes control with target A1c<7.5%, and HTN control with SBP<140. F/u 6 months and repeat renal panel at that time. - Microalbuminuria: On lisinopril 580m, suspected secondary due to diabetic nephropathy. - Chronic back pain: No NSDAIDs. - Say amlodipine causes nausea  - Advised not to ACE-i during vomiting episodes    . Complication of anesthesia    "problems waking up"  . Cyclic vomiting syndrome   . Delayed gastric emptying 05/12/2011     04/12/11 NUCLEAR MEDICINE GASTRIC EMPTYING SCAN Radiopharmaceutical: 2.0 mCi Tc-92mulfur colloid. Comparison: None. Findings: At 1 hour, 81% of the counts remain in the stomach. At 2 hours, 53% remain. Normal is less than 30% at 2 hours. IMPRESSION: Moderate delay in gastric emptying.      . Depression   . Depression with anxiety 02/23/2007   See [facility] Note dated 05/06/2016.  IC (KGwenlyn Saranwas called in and I did a brief assessment.   . Diabetic gastroparesis ([facility]   /e-chart  . Diabetic ketoacidosis without coma associated with type 2 diabetes mellitus ([facility]  . DKA (diabetic ketoacidoses) ([facility]/07/2016  . Dysuria 10/01/2016  . Elevated cholesterol 11/28/2015  . Emotional stress 12/30/2015   Stress after Breakup with partnes of 13 years on 12/27/2015   . Encounter for chronic pain management 01/06/2015   Indication for chronic opioid: Chronic back pain, cyclic vomiting Medication and dose: oxycodone 80m82m# pills per month: 15 Last UDS date: 05/08/2015 - result pending Pain contract signed (Y/N): Yes, reviewed 01/06/2015  Date narcotic database last reviewed (include red flags): 05/08/2015 (no red flags).    . Esophagitis 04/18/2013   Documented by Dr. PerHenrene PastorD 03/2011  He also consulted on her during hospitalization 03/58/0998r cyclical vomiting. No repeat EGD necessary, treat esophagitis with PPI.    . EMarland Kitchensential hypertension 05/01/2016  .  bilateral low back pain 04/10/2007  . Anxiety disorder 02/23/2007  . TOBACCO DEPENDENCE 02/23/2007  . Chronic insomnia secondary to General Medical and Mood disorders 02/23/2007  . DM neuropathies ([place]) 02/23/2007  . GERD with esophagitis 01/27/2004    [place] 06/19/2019, 11:49 AM  [facility] [address] [place], [place], [address] Phone: [phone]   Fax:  [phone]  Name: [name] [name] MRN: [id number] Date of Birth: 13-Oct-1966        PHYSICAL THERAPY DISCHARGE SUMMARY  Visits from Start of Care: 5  Current functional level related to goals / functional outcomes: See goals, FOTO 58% limited   Remaining deficits: continued pain in the R shoulder limiting ROM and strength, that is worse with activity   Education / Equipment: HEP, posture, RICE ,  Plan: Patient agrees to discharge.  Patient goals were not met. Patient is being discharged due to the patient's request.  ?????         Montavis Schubring PT, DPT, LAT, ATC  06/19/19  11:50  AM

## 2019-07-05 ENCOUNTER — Other Ambulatory Visit: Payer: Self-pay | Admitting: Family Medicine

## 2019-07-05 DIAGNOSIS — K3184 Gastroparesis: Secondary | ICD-10-CM

## 2019-07-05 DIAGNOSIS — R1115 Cyclical vomiting syndrome unrelated to migraine: Secondary | ICD-10-CM

## 2019-07-05 DIAGNOSIS — E1143 Type 2 diabetes mellitus with diabetic autonomic (poly)neuropathy: Secondary | ICD-10-CM

## 2019-07-05 MED ORDER — OXYCODONE HCL 5 MG PO TABS: 5.0000 mg | ORAL_TABLET | ORAL | 0 refills | Status: DC | PRN

## 2019-07-05 NOTE — Telephone Encounter (Signed)
Will forward to MD. Arthi Mcdonald,CMA  

## 2019-07-05 NOTE — Telephone Encounter (Signed)
Pt called to get refill on her medication oxyCODONE, pt states med will run out on 07-11, also pt wants to inform PCP that her shoulder surgery is scheduled for 07-23. Please give pt a call back.

## 2019-07-09 ENCOUNTER — Encounter: Payer: Self-pay | Admitting: Family Medicine

## 2019-07-09 NOTE — Progress Notes (Signed)
Office Consultation Note Justice Britain MD ([facility]) 06/22/19 A/Right shoulder pain        - Tendinosis supra- and infraspinatus. No rotator cuff tear. Tendinosis of intra-articular portion of bicep.  Bony impingement acromion and bursitis changes P/ Arthroscopy planned

## 2019-07-13 NOTE — Progress Notes (Signed)
ekg 03-01-19 epic

## 2019-07-13 NOTE — Patient Instructions (Signed)
YOU NEED TO HAVE A COVID 19 TEST ON_______ @_______ , THIS TEST MUST BE DONE BEFORE SURGERY, COME TO [facility] ENTRANCE. ONCE YOUR COVID TEST IS COMPLETED, PLEASE BEGIN THE QUARANTINE INSTRUCTIONS AS OUTLINED IN YOUR HANDOUT.                NAOMI CASTROGIOVANNI  07/13/2019   Your procedure is scheduled on: 07-19-19  Report to [facility] Main  Entrance              Report to admitting at       1030 AM      Call this number if you have problems the morning of surgery [phone]    Remember: Do not eat food or drink liquids :After Midnight. BRUSH YOUR TEETH MORNING OF SURGERY AND RINSE YOUR MOUTH OUT, NO CHEWING GUM CANDY OR MINTS.     Take these medicines the morning of surgery with A SIP OF WATER: carvedilol, amitiza if needed, oxycodone if needed, flexril if                Needed  How to Manage Your Diabetes Before and After Surgery  Why is it important to control my blood sugar before and after surgery? . Improving blood sugar levels before and after surgery helps healing and can limit problems. . A way of improving blood sugar control is eating a healthy diet by: o  Eating less sugar and carbohydrates o  Increasing activity/exercise o  Talking with your doctor about reaching your blood sugar goals . High blood sugars (greater than 180 mg/dL) can raise your risk of infections and slow your recovery, so you will need to focus on controlling your diabetes during the weeks before surgery. . Make sure that the doctor who takes care of your diabetes knows about your planned surgery including the date and location.  How do I manage my blood sugar before surgery? . Check your blood sugar at least 4 times a day, starting 2 days before surgery, to make sure that the level is not too high or low. o Check your blood sugar the morning of your surgery when you wake up and every 2 hours until you get to the Short Stay unit. . If your blood sugar is less than  70 mg/dL, you will need to treat for low blood sugar: o Do not take insulin. o Treat a low blood sugar (less than 70 mg/dL) with  cup of clear juice (cranberry or apple), 4 glucose tablets, OR glucose gel. o Recheck blood sugar in 15 minutes after treatment (to make sure it is greater than 70 mg/dL). If your blood sugar is not greater than 70 mg/dL on recheck, call [phone] for further instructions. . Report your blood sugar to the short stay nurse when you get to Short Stay.  . If you are admitted to the hospital after surgery: o Your blood sugar will be checked by the staff and you will probably be given insulin after surgery (instead of oral diabetes medicines) to make sure you have good blood sugar levels. o The goal for blood sugar control after surgery is 80-180 mg/dL.   WHAT DO I DO ABOUT MY DIABETES MEDICATION?  Marland Kitchen Do not take oral diabetes medicines (pills) the morning of surgery.  . THE NIGHT BEFORE SURGERY, take   50% of your normal Lantus insulin dose             THE MORNING OF SURGERY, take 50%  Of your normal lantus insulin dose                                    You may not have any metal on your body including hair pins and              piercings  Do not wear jewelry, make-up, lotions, powders or perfumes, deodorant             Do not wear nail polish.  Do not shave  48 hours prior to surgery.                Do not bring valuables to the hospital. [facility].  Contacts, dentures or bridgework may not be worn into surgery.      Patients discharged the day of surgery will not be allowed to drive home. IF YOU ARE HAVING SURGERY AND GOING HOME THE SAME DAY, YOU MUST HAVE AN ADULT TO DRIVE YOU HOME AND BE WITH YOU FOR 24 HOURS. YOU MAY GO HOME BY TAXI OR UBER OR ORTHERWISE, BUT AN ADULT MUST ACCOMPANY YOU HOME AND STAY WITH YOU FOR 24 HOURS.  Name and phone number of your driver:  Special Instructions: N/A               Please read over the following fact sheets you were given: _____________________________________________________________________             [facility] - Preparing for Surgery Before surgery, you can play an important role.  Because skin is not sterile, your skin needs to be as free of germs as possible.  You can reduce the number of germs on your skin by washing with CHG (chlorahexidine gluconate) soap before surgery.  CHG is an antiseptic cleaner which kills germs and bonds with the skin to continue killing germs even after washing. Please DO NOT use if you have an allergy to CHG or antibacterial soaps.  If your skin becomes reddened/irritated stop using the CHG and inform your nurse when you arrive at Short Stay. Do not shave (including legs and underarms) for at least 48 hours prior to the first CHG shower.  You may shave your face/neck. Please follow these instructions carefully:  1.  Shower with CHG Soap the night before surgery and the  morning of Surgery.  2.  If you choose to wash your hair, wash your hair first as usual with your  normal  shampoo.  3.  After you shampoo, rinse your hair and body thoroughly to remove the  shampoo.                           4.  Use CHG as you would any other liquid soap.  You can apply chg directly  to the skin and wash                       Gently with a scrungie or clean washcloth.  5.  Apply the CHG Soap to your body ONLY FROM THE NECK DOWN.   Do not use on face/ open                           Wound or open sores.  Avoid contact with eyes, ears mouth and genitals (private parts).                       Wash face,  Genitals (private parts) with your normal soap.             6.  Wash thoroughly, paying special attention to the area where your surgery  will be performed.  7.  Thoroughly rinse your body with warm water from the neck down.  8.  DO NOT shower/wash with your normal soap after using and rinsing off  the CHG Soap.                9.  Pat  yourself dry with a clean towel.            10.  Wear clean pajamas.            11.  Place clean sheets on your bed the night of your first shower and do not  sleep with pets. Day of Surgery : Do not apply any lotions/deodorants the morning of surgery.  Please wear clean clothes to the hospital/surgery center.  FAILURE TO FOLLOW THESE INSTRUCTIONS MAY RESULT IN THE CANCELLATION OF YOUR SURGERY PATIENT SIGNATURE_________________________________  NURSE SIGNATURE__________________________________  ________________________________________________________________________

## 2019-07-16 ENCOUNTER — Other Ambulatory Visit (HOSPITAL_COMMUNITY)
Admission: RE | Admit: 2019-07-16 | Discharge: 2019-07-16 | Disposition: A | Discharge status: Home / Self Care | Payer: 59 | Source: Ambulatory Visit

## 2019-07-16 ENCOUNTER — Other Ambulatory Visit: Payer: Self-pay

## 2019-07-16 ENCOUNTER — Ambulatory Visit (INDEPENDENT_AMBULATORY_CARE_PROVIDER_SITE_OTHER): Payer: 59 | Admitting: Family Medicine

## 2019-07-16 ENCOUNTER — Emergency Department (HOSPITAL_COMMUNITY)
Admission: EM | Admit: 2019-07-16 | Discharge: 2019-07-16 | Disposition: A | Discharge status: Home / Self Care | Payer: 59 | Attending: Emergency Medicine | Admitting: Emergency Medicine

## 2019-07-16 ENCOUNTER — Encounter: Payer: Self-pay | Admitting: Family Medicine

## 2019-07-16 ENCOUNTER — Encounter (HOSPITAL_COMMUNITY)
Admission: RE | Admit: 2019-07-16 | Discharge: 2019-07-16 | Disposition: A | Discharge status: Home / Self Care | Payer: 59 | Source: Ambulatory Visit | Attending: Orthopedic Surgery | Admitting: Orthopedic Surgery

## 2019-07-16 VITALS — BP 140/80 | HR 80

## 2019-07-16 DIAGNOSIS — E11649 Type 2 diabetes mellitus with hypoglycemia without coma: Secondary | ICD-10-CM | POA: Diagnosis present

## 2019-07-16 DIAGNOSIS — F17210 Nicotine dependence, cigarettes, uncomplicated: Secondary | ICD-10-CM | POA: Diagnosis not present

## 2019-07-16 DIAGNOSIS — R109 Unspecified abdominal pain: Secondary | ICD-10-CM | POA: Diagnosis not present

## 2019-07-16 DIAGNOSIS — E1140 Type 2 diabetes mellitus with diabetic neuropathy, unspecified: Secondary | ICD-10-CM | POA: Diagnosis not present

## 2019-07-16 DIAGNOSIS — Z79899 Other long term (current) drug therapy: Secondary | ICD-10-CM | POA: Diagnosis not present

## 2019-07-16 DIAGNOSIS — Z01812 Encounter for preprocedural laboratory examination: Secondary | ICD-10-CM | POA: Insufficient documentation

## 2019-07-16 DIAGNOSIS — R309 Painful micturition, unspecified: Secondary | ICD-10-CM | POA: Diagnosis not present

## 2019-07-16 DIAGNOSIS — Z794 Long term (current) use of insulin: Secondary | ICD-10-CM | POA: Insufficient documentation

## 2019-07-16 DIAGNOSIS — M25811 Other specified joint disorders, right shoulder: Secondary | ICD-10-CM | POA: Insufficient documentation

## 2019-07-16 DIAGNOSIS — N183 Chronic kidney disease, stage 3 (moderate): Secondary | ICD-10-CM | POA: Diagnosis not present

## 2019-07-16 DIAGNOSIS — Z1159 Encounter for screening for other viral diseases: Secondary | ICD-10-CM | POA: Insufficient documentation

## 2019-07-16 DIAGNOSIS — I129 Hypertensive chronic kidney disease with stage 1 through stage 4 chronic kidney disease, or unspecified chronic kidney disease: Secondary | ICD-10-CM | POA: Diagnosis not present

## 2019-07-16 DIAGNOSIS — M545 Low back pain, unspecified: Secondary | ICD-10-CM

## 2019-07-16 DIAGNOSIS — M19011 Primary osteoarthritis, right shoulder: Secondary | ICD-10-CM | POA: Insufficient documentation

## 2019-07-16 DIAGNOSIS — E162 Hypoglycemia, unspecified: Secondary | ICD-10-CM

## 2019-07-16 DIAGNOSIS — E1122 Type 2 diabetes mellitus with diabetic chronic kidney disease: Secondary | ICD-10-CM | POA: Insufficient documentation

## 2019-07-16 DIAGNOSIS — Z20828 Contact with and (suspected) exposure to other viral communicable diseases: Secondary | ICD-10-CM | POA: Insufficient documentation

## 2019-07-16 LAB — CBC
HCT: 36.4 % (ref 36.0–46.0)
Hemoglobin: 11.6 g/dL — ABNORMAL LOW (ref 12.0–15.0)
MCH: 30.9 pg (ref 26.0–34.0)
MCHC: 31.9 g/dL (ref 30.0–36.0)
MCV: 97.1 fL (ref 80.0–100.0)
Platelets: 210 10*3/uL (ref 150–400)
RBC: 3.75 MIL/uL — ABNORMAL LOW (ref 3.87–5.11)
RDW: 14.5 % (ref 11.5–15.5)
WBC: 6.2 10*3/uL (ref 4.0–10.5)
nRBC: 0 % (ref 0.0–0.2)

## 2019-07-16 LAB — POCT URINALYSIS DIP (MANUAL ENTRY)
Bilirubin, UA: NEGATIVE
Glucose, UA: NEGATIVE mg/dL
Ketones, POC UA: NEGATIVE mg/dL
Leukocytes, UA: NEGATIVE
Nitrite, UA: NEGATIVE
Protein Ur, POC: 100 mg/dL — AB
Spec Grav, UA: 1.015 (ref 1.010–1.025)
Urobilinogen, UA: 0.2 E.U./dL
pH, UA: 6 (ref 5.0–8.0)

## 2019-07-16 LAB — POCT UA - MICROSCOPIC ONLY

## 2019-07-16 LAB — GLUCOSE, CAPILLARY
Glucose-Capillary: 34 mg/dL — CL (ref 70–99)
Glucose-Capillary: 39 mg/dL — CL (ref 70–99)
Glucose-Capillary: 51 mg/dL — ABNORMAL LOW (ref 70–99)

## 2019-07-16 LAB — BASIC METABOLIC PANEL
Anion gap: 10 (ref 5–15)
BUN: 26 mg/dL — ABNORMAL HIGH (ref 6–20)
CO2: 23 mmol/L (ref 22–32)
Calcium: 9.8 mg/dL (ref 8.9–10.3)
Chloride: 104 mmol/L (ref 98–111)
Creatinine, Ser: 2.09 mg/dL — ABNORMAL HIGH (ref 0.44–1.00)
GFR calc Af Amer: 30 mL/min — ABNORMAL LOW (ref >60)
GFR calc non Af Amer: 26 mL/min — ABNORMAL LOW (ref >60)
Glucose, Bld: 93 mg/dL (ref 70–99)
Potassium: 4.2 mmol/L (ref 3.5–5.1)
Sodium: 137 mmol/L (ref 135–145)

## 2019-07-16 LAB — HEMOGLOBIN A1C
Hgb A1c MFr Bld: 6.5 % — ABNORMAL HIGH (ref 4.8–5.6)
Mean Plasma Glucose: 139.85 mg/dL

## 2019-07-16 LAB — SARS CORONAVIRUS 2 (TAT 6-24 HRS): SARS Coronavirus 2: NEGATIVE

## 2019-07-16 LAB — CBG MONITORING, ED
Glucose-Capillary: 65 mg/dL — ABNORMAL LOW (ref 70–99)
Glucose-Capillary: 142 mg/dL — ABNORMAL HIGH (ref 70–99)

## 2019-07-16 MED ORDER — DEXTROSE 50 % IV SOLN
1.0000 | Freq: Once | INTRAVENOUS | Status: AC
  Administered 2019-07-16: 25 mL via INTRAVENOUS

## 2019-07-16 MED ORDER — PHENAZOPYRIDINE HCL 100 MG PO TABS: 100.0000 mg | ORAL_TABLET | Freq: Three times a day (TID) | ORAL | 0 refills | Status: DC | PRN

## 2019-07-16 NOTE — Discharge Instructions (Signed)
Make sure that you are eating regularly to prevent further periods of hypoglycemia.  If you continue to have low blood sugar your doctor will need to adjust your insulin dosing.

## 2019-07-16 NOTE — Assessment & Plan Note (Signed)
Pt has 1 day of flank pain and urinary sx - Ordered CBC, UA - Pyridium 100 mg prn for urinary sx take up to 3 times per day - Pt has shoulder surgery scheduled for 7/23, going today for pre-labs

## 2019-07-16 NOTE — ED Triage Notes (Signed)
Pt was at presurgery today. Her FSBG was 34 upon arrival. Pt had 2 juices, peanut butter, and sprite. Pt was acting confused upon arrival to pre surgery. Pt is now A&Ox4  Pt drove herself here today.

## 2019-07-16 NOTE — ED Provider Notes (Signed)
[facility] DEPT Provider Note   CSN: [id number] Arrival date & time: 07/16/19  1510    History   Chief Complaint Chief Complaint  Patient presents with   Hypoglycemia    HPI [name] [name] is a 53 y.o. female.     HPI   She presents to the emergency department from the preop unit, where she presented for labs to be done for upcoming right shoulder surgery.  While there she was felt to be altered so CBG was checked and it was low at 35.  She was given oral glucose with improvement of her mental status.  She arrived to the ED with CBG 65.  Patient is alert.  She states that she took last insulin this morning because she took her nighttime dose later than usual.  She did not eat lunch.  She denies recent illnesses including fever, chills, nausea, vomiting, injuries, falls, weakness or paresthesia.  She states she cannot take Tylenol or ibuprofen because of vomiting.  She has chronic pain and uses oxycodone regularly.  She complains of a headache at this time.  There are no other known modifying factors.  Past Medical History:  Diagnosis Date   Abdominal pain, generalized    Acquired bilateral hammer toes 01/20/2017   Acute post-traumatic stress disorder 11/17/2018   Allergy    Anemia    Anxiety    Arthritis    At risk for polypharmacy 08/15/2014   Biceps tendinopathy of right upper extremity 04/05/2014   Blood transfusion 2003   Cataract    both eyes   Chronic generalized abdominal pain    Chronic generalized abdominal pain    Chronic kidney disease (CKD), stage III (moderate) (HCC)    Chronic leg cramping 07/18/2014   CKD (chronic kidney disease) 08/17/2013   Seen by Dr Joelyn Oms 07/28/15 ([place]) - (see scanned document): 20 years diabetes, 15 years HTN, stable CKD stage III with proteinuria.  - CKD, discussed NSAID avoidance, diabetes control with target A1c<7.5%, and HTN control with SBP<140. F/u 6 months and repeat  renal panel at that time. - Microalbuminuria: On lisinopril 40mg , suspected secondary due to diabetic nephropathy. - Chronic back pain: No NSDAIDs. - Say amlodipine causes nausea  - Advised not to ACE-i during vomiting episodes     Complication of anesthesia    "problems waking up"   Cyclic vomiting syndrome    Delayed gastric emptying 05/12/2011    04/12/11 NUCLEAR MEDICINE GASTRIC EMPTYING SCAN [employer]: 2.0 mCi Tc-74m sulfur colloid. Comparison: None. Findings: At 1 hour, 81% of the counts remain in the stomach. At 2 hours, 53% remain. Normal is less than 30% at 2 hours. IMPRESSION: Moderate delay in gastric emptying.       Depression    Depression with anxiety 02/23/2007   See [facility] Note dated 05/06/2016.  IC Gwenlyn Saran) was called in and I did a brief assessment.    Diabetic gastroparesis ([place])    /e-chart   Diabetic ketoacidosis without coma associated with type 2 diabetes mellitus ([place])    DKA (diabetic ketoacidoses) ([place]) 08/03/2016   Dysuria 10/01/2016   Elevated cholesterol 11/28/2015   Emotional stress 12/30/2015   Stress after Breakup with partnes of 13 years on 12/27/2015    Encounter for chronic pain management 01/06/2015   Indication for chronic opioid: Chronic back pain, cyclic vomiting Medication and dose: oxycodone 5mg   # pills per month: 15 Last UDS date: 05/08/2015 - result pending Pain contract signed (Y/N): Yes,  reviewed 01/06/2015  Date narcotic database last reviewed (include red flags): 05/08/2015 (no red flags).     Esophagitis 04/18/2013   Documented by Dr. Henrene Pastor EGD 03/2011  He also consulted on her during hospitalization 26/2035 for cyclical vomiting. No repeat EGD necessary, treat esophagitis with PPI.     Essential hypertension 05/01/2016   Essential hypertension, malignant 02/23/2007   Jan 2015 - stopped recently-started norvasc due to reports of GI symptoms. 02/2015 - holding lisinopril, adding hydralazine 10mg  TID, and will take 1/2 tab  lisinopril when feeling better when not in cyclic vomiting flare    Essential hypertension, malignant 02/23/2007   Jan 2015 - stopped recently-started norvasc due to reports of GI symptoms. 02/2015 - holding lisinopril, adding hydralazine 10mg  TID, and will take 1/2 tab lisinopril when feeling better when not in cyclic vomiting flare    GERD (gastroesophageal reflux disease)    Heart murmur    Hematuria 07/09/2015   High risk social situation 08/31/2012   History of colonic polyps 11/15/2018   10/2017 screening colonoscopy Dr Scarlette Shorts: Seven polyps were found in the sigmoid colon, transverse colon, ascending colon and cecum. The polyps were 2 to 10 mm in size. These polyps were removed with a cold snare.    Hot flashes 07/29/2011   Hypertension    Incontinence 10/03/2017   INSOMNIA NOS 02/23/2007   Qualifier: Diagnosis of  By: Beryle Lathe     Intermittent constipation 06/06/2015   Intestinal impaction ([place])    Intractable vomiting 07/02/2016   Marijuana smoker, continuous    pt. denies does smoke marijuana   MIGRAINE, UNSPEC., W/O INTRACTABLE MIGRAINE 02/23/2007   Qualifier: Diagnosis of  By: Beryle Lathe     Myocardial infarction [facility]) 07/2007   "they say I've had a silent one; I don't know"   Nausea and vomiting in adult 06/06/2013   Neuromuscular disorder ([place])    neuropathy in both legs   NEUROPATHY, DIABETIC 02/23/2007   Nonspecific low back pain 04/10/2007   Back pain is chronic. Oxycodone per prior notes helps pt be more active. Reportedly uses 4 tab/week oxy for intermittent worsening of chronic back pain but mostly for cyclic vom to keep her out of hospital.     Normocytic anemia    Onychomycosis 03/25/2015   PANCREATITIS 05/09/2008   Pneumonia    Protein-calorie malnutrition, severe ([place]) 12/17/2013   Pure hypercholesterolemia 08/15/2014   Based on her choleserol, BP, smoker status, and that she is a diabetic, her 10 year ASCVD risk is 9.9% putting her in  a category of risk that would benefit from high-intensity statin.     Rotator cuff syndrome 04/19/2014   rt. shoulder   Smoker    TOBACCO DEPENDENCE 02/23/2007   2 cigarettes every few days 11/2012 3 cigarettes daily     Type II diabetes mellitus ([place])    Uncontrolled secondary diabetes mellitus with stage 3 CKD (GFR 30-59) ([place]) 10/23/2011   Nephropathy, gastroparesis  HgbA1c (06/2011) 7.6, 9.5, 12.2, 8.2, 10.9 (07/2012), 9.2 (02/11/2013), 7.7 (09/27/13)  Metformin: she has a history of cyclical vomiting; she would prefer not to take this medication  On lanuts 20U qam with improved control. 09/27/13    Vitamin D deficiency 11/28/2014    Patient Active Problem List   Diagnosis Date Noted   Flank pain 07/16/2019   History of multiple colonic tubular adenomas 11/15/2018   Grief reaction 10/16/2018   Left ventricular diastolic dysfunction with preserved systolic function 59/74/1638   Intractable vomiting 08/12/2018  Rotator cuff tendonopathies with partial tear, right 03/31/2018   Hip pain, bilateral 03/31/2018   Incontinence 10/03/2017   Healthcare maintenance 09/02/2017   Stye external 09/02/2017   Pre-ulcerative calluses 05/27/2017   Screening for colon cancer 05/27/2017   Primary income source is welfare benefits 03/10/2017   Acquired bilateral hammer toes 01/20/2017   Port-A-Cath in place 01/20/2017   Secondary hyperparathyroidism of renal origin ([place]) 10/05/2016   Recurrent major depressive disorder ([place])    Essential hypertension 05/01/2016   Diabetic gastroparesis associated with type 2 diabetes mellitus ([place]) 05/01/2016   DM (diabetes mellitus), type 2 with complications (HCC)    Hyperkalemia 11/28/2015   Normocytic anemia    Intermittent constipation 06/06/2015   Encounter for monitoring opioid maintenance therapy 01/06/2015   Vitamin D deficiency 11/28/2014   Pure hypercholesterolemia 08/15/2014   Chronic kidney disease (CKD) stage G4/A2,  severely decreased glomerular filtration rate (GFR) between 15-29 mL/min/1.73 square meter and albuminuria creatinine ratio between 30-299 mg/g ([place]) 08/17/2013   Esophagitis determined by endoscopy 04/18/2013   Allergic rhinitis, seasonal 05/04/2012   Marijuana smoker ([place]) 08/67/6195   Cyclic vomiting syndrome 08/03/2011   Chronic bilateral low back pain 04/10/2007   Anxiety disorder 02/23/2007   TOBACCO DEPENDENCE 02/23/2007   Chronic insomnia secondary to General Medical and Mood disorders 02/23/2007   DM neuropathies ([place]) 02/23/2007   GERD with esophagitis 01/27/2004    Past Surgical History:  Procedure Laterality Date   ABDOMINAL HYSTERECTOMY  02/2002   CARDIAC CATHETERIZATION     CATARACT EXTRACTION W/ INTRAOCULAR LENS  IMPLANT, BILATERAL     CESAREAN SECTION     x 3   CHOLECYSTECTOMY  1980's   COLONOSCOPY     patient not sure   DILATION AND EVACUATION  X3   laparotomy and lysis of adhesions     left hand surgery     PORT-A-CATH placement  ~ 2008   right chest; "poor access; frequent hospitalizations"   SMALL INTESTINE SURGERY     laporotomy with lysis of adhesions.   UPPER GASTROINTESTINAL ENDOSCOPY     UPPER GI ENDOSCOPY       OB History    Gravida  7   Para      Term      Preterm      AB  3   Living  4     SAB      TAB  3   Ectopic      Multiple      Live Births               Home Medications    Prior to Admission medications   Medication Sig Start Date End Date Taking? Authorizing Provider  AMITIZA 24 MCG capsule TAKE 1 CAPSULE(24 MCG) BY MOUTH TWICE DAILY WITH A MEAL Patient taking differently: Take 24 mcg by mouth 2 (two) times daily with a meal.  03/31/18  Yes McDiarmid, Blane Ohara, MD  amLODipine (NORVASC) 5 MG tablet Take 5 mg by mouth at bedtime.  04/11/18  Yes McDiarmid, Blane Ohara, MD  carvedilol (COREG) 6.25 MG tablet TAKE 1 TABLET BY MOUTH TWICE DAILY WITH MEALS Patient taking differently: Take 6.25 mg by mouth 2  (two) times daily with a meal.  01/22/19  Yes McDiarmid, Blane Ohara, MD  diclofenac sodium (VOLTAREN) 1 % GEL Apply 4 g topically 4 (four) times daily. Apply to shoulder Patient taking differently: Apply 4 g topically 4 (four) times daily as needed (shoulder pain).  05/14/19  Yes McDiarmid, Blane Ohara, MD  Insulin Glargine (LANTUS SOLOSTAR) 100 UNIT/ML Solostar Pen Inject 20 Units into the skin daily. Patient taking differently: Inject 20 Units into the skin at bedtime.  12/28/18  Yes McDiarmid, Blane Ohara, MD  LORazepam (ATIVAN) 1 MG tablet Take 1 tablet (1 mg total) by mouth every 6 (six) hours as needed (cyclic vomiting). 12/28/18  Yes McDiarmid, Blane Ohara, MD  oxyCODONE (OXY IR/ROXICODONE) 5 MG immediate release tablet Take 1 tablet (5 mg total) by mouth every 4 (four) hours as needed for severe pain. 07/05/19 08/04/19 Yes McDiarmid, Blane Ohara, MD  promethazine (PHENERGAN) 12.5 MG tablet Take 1 tablet (12.5 mg total) by mouth every 6 (six) hours as needed for nausea or vomiting. 09/21/18  Yes McDiarmid, Blane Ohara, MD  psyllium (METAMUCIL) 58.6 % powder Take 1 packet by mouth daily.   Yes [provider]  cyclobenzaprine (FLEXERIL) 5 MG tablet Take 1 tablet (5 mg total) by mouth 3 (three) times daily as needed for muscle spasms. 06/07/19   McDiarmid, Blane Ohara, MD  famotidine (PEPCID) 20 MG tablet Take 0.5 tablets (10 mg total) by mouth daily. Patient not taking: Reported on 05/22/2019 03/01/19   Khatri, Hina, PA-C  glucose blood test strip Accu-Chek Aviva strips. Check blood glucose 3 times a day. 03/22/19   McDiarmid, Blane Ohara, MD  Insulin Pen Needle (B-D UF III MINI PEN NEEDLES) 31G X 5 MM MISC Check CBGs three times a day for E11.9 10/27/18   McDiarmid, Blane Ohara, MD  ondansetron (ZOFRAN ODT) 4 MG disintegrating tablet Take 1 tablet (4 mg total) by mouth every 8 (eight) hours as needed for nausea or vomiting. Patient not taking: Reported on 05/22/2019 03/01/19   Delia Heady, PA-C  phenazopyridine (PYRIDIUM) 100 MG tablet Take 1  tablet (100 mg total) by mouth 3 (three) times daily as needed for pain. 07/16/19   Gladys Damme, MD    Family History Family History  Problem Relation Age of Onset   Diabetes type II Mother    Hypertension Mother    Heart attack Mother    Other Mother        Leg amputation   Kidney disease Mother        Required dialysis 10 years   Diabetes type II Sister    Diabetes Sister    Hypertension Sister    Alcohol abuse Father    Diabetes Sister    Diabetes Sister    Cancer Sister 1       Brain   Colon cancer Neg Hx    Colon polyps Neg Hx    Esophageal cancer Neg Hx    Rectal cancer Neg Hx    Stomach cancer Neg Hx     Social History Social History   Tobacco Use   Smoking status: Current Some Day Smoker    Packs/day: 0.50    Years: 22.00    Pack years: 11.00    Types: Cigarettes    Start date: 12/27/1982   Smokeless tobacco: Never Used  Substance Use Topics   Alcohol use: Yes    Comment: occaisionally; pt reports drinking beer to help her when constipated   Drug use: Yes    Types: Marijuana    Comment: pt reports she has not "smoked" in 2 wks     Allergies   Bee venom, Tegaderm ag mesh [silver], Acetaminophen, Novolog [insulin aspart], Statins, Lyrica [pregabalin], Tape, Ceftriaxone, and Erythromycin   Review of Systems Review of Systems  All other  systems reviewed and are negative.    Physical Exam Updated Vital Signs BP (!) 171/84    Pulse 74    Temp 98 F (36.7 C) (Oral)    Resp 18    SpO2 100%   Physical Exam Vitals signs and nursing note reviewed.  Constitutional:      General: She is not in acute distress.    Appearance: She is well-developed. She is not ill-appearing, toxic-appearing or diaphoretic.  HENT:     Head: Normocephalic and atraumatic.     Right Ear: External ear normal.     Left Ear: External ear normal.  Eyes:     Conjunctiva/sclera: Conjunctivae normal.     Pupils: Pupils are equal, round, and reactive to  light.  Neck:     Musculoskeletal: Normal range of motion and neck supple.     Trachea: Phonation normal.  Cardiovascular:     Rate and Rhythm: Normal rate.  Pulmonary:     Effort: Pulmonary effort is normal.  Musculoskeletal: Normal range of motion.  Skin:    General: Skin is warm and dry.  Neurological:     Mental Status: She is alert and oriented to person, place, and time.     Cranial Nerves: No cranial nerve deficit.     Sensory: No sensory deficit.     Motor: No abnormal muscle tone.     Coordination: Coordination normal.     Comments: No dysarthria, aphasia or nystagmus.  Psychiatric:        Mood and Affect: Mood normal.        Behavior: Behavior normal.        Thought Content: Thought content normal.        Judgment: Judgment normal.      ED Treatments / Results  Labs (all labs ordered are listed, but only abnormal results are displayed) Labs Reviewed  CBG MONITORING, ED - Abnormal; Notable for the following components:      Result Value   Glucose-Capillary 65 (*)    All other components within normal limits  CBG MONITORING, ED - Abnormal; Notable for the following components:   Glucose-Capillary 142 (*)    All other components within normal limits  CBC WITH DIFFERENTIAL/PLATELET  COMPREHENSIVE METABOLIC PANEL    EKG None  Radiology No results found.  Procedures Procedures (including critical care time)  Medications Ordered in ED Medications  dextrose 50 % solution 50 mL (25 mLs Intravenous Given 07/16/19 1522)     Initial Impression / Assessment and Plan / ED Course  I have reviewed the triage vital signs and the nursing notes.  Pertinent labs & imaging results that were available during my care of the patient were reviewed by me and considered in my medical decision making (see chart for details).  Clinical Course as of Jul 15 1758  Mon Jul 16, 2019  1601 high  CBG monitoring, ED(!) [EW]    Clinical Course User Index [EW] Daleen Bo, MD         Patient Vitals for the past 24 hrs:  BP Temp Temp src Pulse Resp SpO2  07/16/19 1700 (!) 171/84 -- -- 74 18 100 %  07/16/19 1609 (!) 192/88 -- -- 70 18 99 %  07/16/19 1515 -- 98 F (36.7 C) Oral 76 15 100 %  07/16/19 1512 (!) 197/97 -- -- -- -- --    5:59 PM Reevaluation with update and discussion. After initial assessment and treatment, an updated evaluation reveals patient is comfortable and  ready to go home.  She did not have labs drawn, inadvertently.  Findings discussed and questions answered. Daleen Bo   Medical Decision Making: Hypoglycemia, etiology not clear but may be related to missing lunch after taking morning dose of Lantus.  Doubt serious bacterial infection, metabolic instability or impending vascular collapse.  CRITICAL CARE-yes Performed by: Daleen Bo  Nursing Notes Reviewed/ Care Coordinated Applicable Imaging Reviewed Interpretation of Laboratory Data incorporated into ED treatment  The patient appears reasonably screened and/or stabilized for discharge and I doubt any other medical condition or other [facility] requiring further screening, evaluation, or treatment in the ED at this time prior to discharge.  Plan: Home Medications-continue routine medications; Home Treatments-try to eat regularly; return here if the recommended treatment, does not improve the symptoms; Recommended follow up-see PCP if having continued episodes of low blood sugar.    Final Clinical Impressions(s) / ED Diagnoses   Final diagnoses:  Hypoglycemia    ED Discharge Orders    None       Daleen Bo, MD 07/16/19 1759

## 2019-07-16 NOTE — Progress Notes (Signed)
   Subjective:    Patient ID: [name] [name], female    DOB: Dec 10, 1966, 53 y.o.   MRN: [id number]   CC: L flank pain  HPI: [name] [name] is a 53 yo woman w/ extensive past medical hx most notably of DMT2, stable CKD 3, HTN, cyclic vomiting d/o, and shoulder rotator cuff tear who presents today for 1 day h/o L flank pain. Pt stated that the pain started yesterday AM, it is dull and nature and feels "deep in there." She is also having urinary sx such as urinary urge and frequency. Pt says this pain reminds her of sx she had in May when she was treated by her nephrologist for UTI. She has tried her oxycodone rx for pain and it gave her minimal relief. She has not used flexeril as she says it makes her feel worse. No fever, no chills, no diarrhea, no CP or SOB. She has a h/o cyclic vomiting d/o and has some nausea and vomiting.  Pt is scheduled for shoulder arthroscopy on 7/23.  Smoking status reviewed   ROS: pertinent noted in the HPI    Past medical history, surgical, family, and social history reviewed and updated in the EMR as appropriate.  Objective:  BP 140/80   Pulse 80   SpO2 99%   Vitals and nursing note reviewed  General: NAD, pleasant but worried, able to participate in exam Cardiac: RRR, S1 S2 present. normal heart sounds, no murmurs. Respiratory: CTAB, normal effort, No wheezes, rales or rhonchi Abdominal: (+) L CVA tenderness, No guarding, no rebound, soft, tender to palpation in epigastric area Extremities: no edema or cyanosis. Skin: warm and dry, no rashes noted Neuro: alert, no obvious focal deficits Psych: Pt was anxious and tearful   Assessment & Plan:   [name] [name] is a 53 yo woman w/ PMH most notable for DMT2, stable CKD 3, HTN, cyclic vomiting d/o, and shoulder rotator cuff tear here for L flank pain.  Flank pain Pt has 1 day of flank pain and urinary sx - Ordered CBC, UA - Pyridium 100 mg prn for urinary sx take up to 3 times per day - Pt has shoulder  surgery scheduled for 7/23, going today for pre-labs   Gladys Damme, MD [facility] PGY-1

## 2019-07-16 NOTE — ED Notes (Addendum)
An After Visit Summary was printed and given to the patient. Discharge instructions given no further questions at this time. Patient did not want to wait for labs to be drawn. Dr. Eulis Foster aware.

## 2019-07-16 NOTE — Patient Instructions (Signed)
It was a pleasure meeting you!  1. We did lab studies on blood and urine today. We will call you with results.  2. Start pyridium for urinary problems. You can take this up to 3 times a day as needed.  I hope you feel better and that your surgery goes well!  Dr. Gladys Damme

## 2019-07-17 ENCOUNTER — Telehealth: Payer: Self-pay | Admitting: Family Medicine

## 2019-07-17 LAB — CBC WITH DIFFERENTIAL/PLATELET
Basophils Absolute: 0 10*3/uL (ref 0.0–0.2)
Basos: 1 %
EOS (ABSOLUTE): 0.1 10*3/uL (ref 0.0–0.4)
Eos: 1 %
Hematocrit: 35.1 % (ref 34.0–46.6)
Hemoglobin: 11.9 g/dL (ref 11.1–15.9)
Immature Grans (Abs): 0 10*3/uL (ref 0.0–0.1)
Immature Granulocytes: 0 %
Lymphocytes Absolute: 2.6 10*3/uL (ref 0.7–3.1)
Lymphs: 42 %
MCH: 31.2 pg (ref 26.6–33.0)
MCHC: 33.9 g/dL (ref 31.5–35.7)
MCV: 92 fL (ref 79–97)
Monocytes Absolute: 0.5 10*3/uL (ref 0.1–0.9)
Monocytes: 8 %
Neutrophils Absolute: 2.9 10*3/uL (ref 1.4–7.0)
Neutrophils: 48 %
Platelets: 214 10*3/uL (ref 150–450)
RBC: 3.81 x10E6/uL (ref 3.77–5.28)
RDW: 15 % (ref 11.7–15.4)
WBC: 6.1 10*3/uL (ref 3.4–10.8)

## 2019-07-17 NOTE — Progress Notes (Addendum)
Anesthesia Chart Review   Case: [id number] Date/Time: 07/19/19 1220   Procedure: SHOULDER ARTHROSCOPY WITH SUBACROMIAL DECOMPRESSION AND DISTAL CLAVICLE RESECTION, POSSIBLE ROTATOR CUFF REPAIR (Right ) - 44min   Anesthesia type: General   Pre-op diagnosis: Right shoulder chronic impingement, acromioclavicular osteoarthritis, possible rotator cuff tear   Location: [facility] ROOM 06 / [facility]   Surgeon: Justice Britain, MD      DISCUSSION:53 y.o. current some day smoker (11 pack years) with h/o HTN, GERD, depression, anxiety, migraines, MI 2008, DM II, CKD Stage III (creatinine stable), right shoulder impingement, AC joing OA, possible rotator cuff tear scheduled for above procedure 07/19/2019 with Dr. Justice Britain.   CBG 39 at PAT visit.  She reported that she took her Lantus this morning, but has not eaten all day.  She complained of confusion and headache.  Pt drove herself here.  She was given juice and peanut butter crackers.  On recheck pt alert, but unsure where she was, complaining of lightheadedness.  She was sent to the ED for evaluation.  Pt reports she has had a few episodes of blood sugars around 42 in the last few weeks.   Surgeon made aware.  Cleared by PCP, requested copy of clearance.  Discussed with Dr. Lanetta Inch. Anticipate pt can proceed with planned procedure barring acute status change.   VS: BP (!) 185/86 (BP Location: Right Arm)   Pulse 83   Temp 36.9 C (Oral)   Resp 18   Ht 5\' 1"  (1.549 m)   SpO2 100%   BMI 28.42 kg/m   PROVIDERS: McDiarmid, Blane Ohara, MD is PCP    LABS: CBG 39 (all labs ordered are listed, but only abnormal results are displayed)  Labs Reviewed  HEMOGLOBIN A1C - Abnormal; Notable for the following components:      Result Value   Hgb A1c MFr Bld 6.5 (*)    All other components within normal limits  BASIC METABOLIC PANEL - Abnormal; Notable for the following components:   BUN 26 (*)    Creatinine, Ser 2.09 (*)    GFR calc non Af Amer 26 (*)    GFR  calc Af Amer 30 (*)    All other components within normal limits  CBC - Abnormal; Notable for the following components:   RBC 3.75 (*)    Hemoglobin 11.6 (*)    All other components within normal limits  GLUCOSE, CAPILLARY - Abnormal; Notable for the following components:   Glucose-Capillary 39 (*)    All other components within normal limits  GLUCOSE, CAPILLARY - Abnormal; Notable for the following components:   Glucose-Capillary 34 (*)    All other components within normal limits  GLUCOSE, CAPILLARY - Abnormal; Notable for the following components:   Glucose-Capillary 51 (*)    All other components within normal limits     IMAGES:   EKG: 03/02/2019 Rate 94 bpm Sinus rhythm  Biatrial enlargement Anterior infarct, old Artifact in leads I II III aVR aVL aVF  CV: Echo 05/19/16 Study Conclusions  - Left ventricle: The cavity size was normal. There was mild   concentric hypertrophy. Systolic function was normal. Wall motion   was normal; there were no regional wall motion abnormalities.   Features are consistent with a pseudonormal left ventricular   filling pattern, with concomitant abnormal relaxation and   increased filling pressure (grade 2 diastolic dysfunction).   Doppler parameters are consistent with high ventricular filling   pressure. - Aortic valve: Transvalvular velocity was within the  normal range.   There was no stenosis. There was no regurgitation. - Mitral valve: Transvalvular velocity was within the normal range.   There was no evidence for stenosis. There was trivial   regurgitation. - Left atrium: The atrium was moderately dilated. - Right ventricle: The cavity size was normal. Wall thickness was   normal. Systolic function was normal. - Atrial septum: No defect or patent foramen ovale was identified   by color flow Doppler. - Tricuspid valve: There was trivial regurgitation. - Pulmonary arteries: Systolic pressure was within the normal   range. PA  peak pressure: 24 mm Hg (S). Past Medical History:  Diagnosis Date  . Abdominal pain, generalized   . Acquired bilateral hammer toes 01/20/2017  . Acute post-traumatic stress disorder 11/17/2018  . Allergy   . Anemia   . Anxiety   . Arthritis   . At risk for polypharmacy 08/15/2014  . Biceps tendinopathy of right upper extremity 04/05/2014  . Blood transfusion 2003  . Cataract    both eyes  . Chronic generalized abdominal pain   . Chronic generalized abdominal pain   . Chronic kidney disease (CKD), stage III (moderate) (HCC)   . Chronic leg cramping 07/18/2014  . CKD (chronic kidney disease) 08/17/2013   Seen by Dr Joelyn Oms 07/28/15 ([place]) - (see scanned document): 20 years diabetes, 15 years HTN, stable CKD stage III with proteinuria.  - CKD, discussed NSAID avoidance, diabetes control with target A1c<7.5%, and HTN control with SBP<140. F/u 6 months and repeat renal panel at that time. - Microalbuminuria: On lisinopril 40mg , suspected secondary due to diabetic nephropathy. - Chronic back pain: No NSDAIDs. - Say amlodipine causes nausea  - Advised not to ACE-i during vomiting episodes    . Complication of anesthesia    "problems waking up"  . Cyclic vomiting syndrome   . Delayed gastric emptying 05/12/2011    04/12/11 NUCLEAR MEDICINE GASTRIC EMPTYING SCAN Radiopharmaceutical: 2.0 mCi Tc-79m sulfur colloid. Comparison: None. Findings: At 1 hour, 81% of the counts remain in the stomach. At 2 hours, 53% remain. Normal is less than 30% at 2 hours. IMPRESSION: Moderate delay in gastric emptying.      . Depression   . Depression with anxiety 02/23/2007   See [facility] dated 05/06/2016.  IC Gwenlyn Saran) was called in and I did a brief assessment.   . Diabetic gastroparesis ([place])    /e-chart  . Diabetic ketoacidosis without coma associated with type 2 diabetes mellitus ([place])   . DKA (diabetic ketoacidoses) ([place]) 08/03/2016  . Dysuria 10/01/2016  . Elevated cholesterol 11/28/2015   . Emotional stress 12/30/2015   Stress after Breakup with partnes of 13 years on 12/27/2015   . Encounter for chronic pain management 01/06/2015   Indication for chronic opioid: Chronic back pain, cyclic vomiting Medication and dose: oxycodone 5mg   # pills per month: 15 Last UDS date: 05/08/2015 - result pending Pain contract signed (Y/N): Yes, reviewed 01/06/2015  Date narcotic database last reviewed (include red flags): 05/08/2015 (no red flags).    . Esophagitis 04/18/2013   Documented by Dr. Henrene Pastor EGD 03/2011  He also consulted on her during hospitalization 53/2992 for cyclical vomiting. No repeat EGD necessary, treat esophagitis with PPI.    Marland Kitchen Essential hypertension 05/01/2016  . Essential hypertension, malignant 02/23/2007   Jan 2015 - stopped recently-started norvasc due to reports of GI symptoms. 02/2015 - holding lisinopril, adding hydralazine 10mg  TID, and will take 1/2 tab lisinopril when feeling better when  not in cyclic vomiting flare   . Essential hypertension, malignant 02/23/2007   Jan 2015 - stopped recently-started norvasc due to reports of GI symptoms. 02/2015 - holding lisinopril, adding hydralazine 10mg  TID, and will take 1/2 tab lisinopril when feeling better when not in cyclic vomiting flare   . GERD (gastroesophageal reflux disease)   . Heart murmur   . Hematuria 07/09/2015  . High risk social situation 08/31/2012  . History of colonic polyps 11/15/2018   10/2017 screening colonoscopy Dr Scarlette Shorts: Seven polyps were found in the sigmoid colon, transverse colon, ascending colon and cecum. The polyps were 2 to 10 mm in size. These polyps were removed with a cold snare.   Marland Kitchen Hot flashes 07/29/2011  . Hypertension   . Incontinence 10/03/2017  . INSOMNIA NOS 02/23/2007   Qualifier: Diagnosis of  By: Beryle Lathe    . Intermittent constipation 06/06/2015  . Intestinal impaction ([place])   . Intractable vomiting 07/02/2016  . Marijuana smoker, continuous    pt. denies does smoke marijuana  .  MIGRAINE, UNSPEC., W/O INTRACTABLE MIGRAINE 02/23/2007   Qualifier: Diagnosis of  By: Beryle Lathe    . Myocardial infarction [facility]) 07/2007   "they say I've had a silent one; I don't know"  . Nausea and vomiting in adult 06/06/2013  . Neuromuscular disorder (HCC)    neuropathy in both legs  . NEUROPATHY, DIABETIC 02/23/2007  . Nonspecific low back pain 04/10/2007   Back pain is chronic. Oxycodone per prior notes helps pt be more active. Reportedly uses 4 tab/week oxy for intermittent worsening of chronic back pain but mostly for cyclic vom to keep her out of hospital.    . Normocytic anemia   . Onychomycosis 03/25/2015  . PANCREATITIS 05/09/2008  . Pneumonia   . Protein-calorie malnutrition, severe ([place]) 12/17/2013  . Pure hypercholesterolemia 08/15/2014   Based on her choleserol, BP, smoker status, and that she is a diabetic, her 10 year ASCVD risk is 9.9% putting her in a category of risk that would benefit from high-intensity statin.    . Rotator cuff syndrome 04/19/2014   rt. shoulder  . Smoker   . TOBACCO DEPENDENCE 02/23/2007   2 cigarettes every few days 11/2012 3 cigarettes daily    . Type II diabetes mellitus ([place])   . Uncontrolled secondary diabetes mellitus with stage 3 CKD (GFR 30-59) (HCC) 10/23/2011   Nephropathy, gastroparesis  HgbA1c (06/2011) 7.6, 9.5, 12.2, 8.2, 10.9 (07/2012), 9.2 (02/11/2013), 7.7 (09/27/13)  Metformin: she has a history of cyclical vomiting; she would prefer not to take this medication  On lanuts 20U qam with improved control. 09/27/13   . Vitamin D deficiency 11/28/2014    Past Surgical History:  Procedure Laterality Date  . ABDOMINAL HYSTERECTOMY  02/2002  . CARDIAC CATHETERIZATION    . CATARACT EXTRACTION W/ INTRAOCULAR LENS  IMPLANT, BILATERAL    . CESAREAN SECTION     x 3  . CHOLECYSTECTOMY  1980's  . COLONOSCOPY     patient not sure  . DILATION AND EVACUATION  X3  . laparotomy and lysis of adhesions    . left hand surgery    . PORT-A-CATH  placement  ~ 2008   right chest; "poor access; frequent hospitalizations"  . SMALL INTESTINE SURGERY     laporotomy with lysis of adhesions.  Marland Kitchen UPPER GASTROINTESTINAL ENDOSCOPY    . UPPER GI ENDOSCOPY      MEDICATIONS: . AMITIZA 24 MCG capsule  . amLODipine (NORVASC) 5 MG tablet  .  carvedilol (COREG) 6.25 MG tablet  . cyclobenzaprine (FLEXERIL) 5 MG tablet  . diclofenac sodium (VOLTAREN) 1 % GEL  . famotidine (PEPCID) 20 MG tablet  . glucose blood test strip  . Insulin Glargine (LANTUS SOLOSTAR) 100 UNIT/ML Solostar Pen  . Insulin Pen Needle (B-D UF III MINI PEN NEEDLES) 31G X 5 MM MISC  . LORazepam (ATIVAN) 1 MG tablet  . ondansetron (ZOFRAN ODT) 4 MG disintegrating tablet  . oxyCODONE (OXY IR/ROXICODONE) 5 MG immediate release tablet  . phenazopyridine (PYRIDIUM) 100 MG tablet  . promethazine (PHENERGAN) 12.5 MG tablet  . psyllium (METAMUCIL) 58.6 % powder   No current facility-administered medications for this encounter.    Maia Plan [facility] Pre-Surgical Testing [phone] 07/17/19  9:12 AM

## 2019-07-17 NOTE — Telephone Encounter (Signed)
Form for Dr Susie Cassette office completed and fax'd today.

## 2019-07-17 NOTE — Telephone Encounter (Signed)
Pt is calling and needs a letter stating that she is cleared to have surgery. Please call [name] [name] and Dr. Susie Cassette office with questions [phone]. She said that they also faxed a form yesterday to be filled out. [name]

## 2019-07-17 NOTE — Telephone Encounter (Signed)
Will forward to MD to advise. [name] [name],CMA  

## 2019-07-18 ENCOUNTER — Other Ambulatory Visit: Payer: Self-pay

## 2019-07-18 ENCOUNTER — Telehealth: Payer: Self-pay | Admitting: Family Medicine

## 2019-07-18 ENCOUNTER — Encounter (HOSPITAL_COMMUNITY): Payer: Self-pay | Admitting: *Deleted

## 2019-07-18 NOTE — Telephone Encounter (Signed)
Spoke with patient regarding her labs that were drawn at the hospital.  She was concerned that her pre-op labs weren't done because she had to go to the ED due to hypoglycemia.  Advised patient that all labs were completed and that she should be a go for tomorrow's surgery.  She will call her surgeon and check on this to make sure and verify time she needs to arrive. Tameika Heckmann,CMA

## 2019-07-18 NOTE — Telephone Encounter (Signed)
LM for patient to call back to discuss labs.  [name] [name],CMA

## 2019-07-18 NOTE — Telephone Encounter (Signed)
Patient returning call. She will keep her phone on her.

## 2019-07-18 NOTE — Anesthesia Preprocedure Evaluation (Addendum)
Anesthesia Evaluation  Patient identified by MRN, date of birth, ID band Patient awake    Reviewed: Allergy & Precautions, NPO status   Airway Mallampati: II  TM Distance: >3 FB     Dental   Pulmonary pneumonia, Current Smoker,    breath sounds clear to auscultation       Cardiovascular hypertension, + Past MI   Rhythm:Regular Rate:Normal     Neuro/Psych    GI/Hepatic Neg liver ROS, GERD  ,  Endo/Other  diabetes  Renal/GU Renal disease     Musculoskeletal   Abdominal   Peds  Hematology   Anesthesia Other Findings   Reproductive/Obstetrics                            Anesthesia Physical Anesthesia Plan  ASA: III  Anesthesia Plan: General   Post-op Pain Management:    Induction: Intravenous  PONV Risk Score and Plan: 2 and Ondansetron, Dexamethasone and Midazolam  Airway Management Planned: Oral ETT  Additional Equipment:   Intra-op Plan:   Post-operative Plan: Extubation in OR  Informed Consent: I have reviewed the patients History and Physical, chart, labs and discussed the procedure including the risks, benefits and alternatives for the proposed anesthesia with the patient or authorized representative who has indicated his/her understanding and acceptance.     Dental advisory given  Plan Discussed with: CRNA and Anesthesiologist  Anesthesia Plan Comments: (See PAT note 07/16/2019, Konrad Felix, PA-C)      Anesthesia Quick Evaluation

## 2019-07-18 NOTE — Telephone Encounter (Signed)
Pt called today to see if she could get her test results due to her having surgery tomorrow (07-23). Please give pt a call back.

## 2019-07-19 ENCOUNTER — Encounter (HOSPITAL_COMMUNITY)
Admission: RE | Disposition: A | Discharge status: Home / Self Care | Payer: Self-pay | Source: Home / Self Care | Attending: Orthopedic Surgery

## 2019-07-19 ENCOUNTER — Ambulatory Visit (HOSPITAL_COMMUNITY): Payer: 59 | Admitting: Certified Registered Nurse Anesthetist

## 2019-07-19 ENCOUNTER — Ambulatory Visit (HOSPITAL_COMMUNITY): Payer: 59 | Admitting: Physician Assistant

## 2019-07-19 ENCOUNTER — Ambulatory Visit (HOSPITAL_COMMUNITY)
Admission: RE | Admit: 2019-07-19 | Discharge: 2019-07-19 | Disposition: A | Discharge status: Home / Self Care | Payer: 59 | Attending: Orthopedic Surgery | Admitting: Orthopedic Surgery

## 2019-07-19 ENCOUNTER — Encounter (HOSPITAL_COMMUNITY): Payer: Self-pay | Admitting: *Deleted

## 2019-07-19 DIAGNOSIS — F419 Anxiety disorder, unspecified: Secondary | ICD-10-CM | POA: Diagnosis not present

## 2019-07-19 DIAGNOSIS — N183 Chronic kidney disease, stage 3 (moderate): Secondary | ICD-10-CM | POA: Insufficient documentation

## 2019-07-19 DIAGNOSIS — Z79899 Other long term (current) drug therapy: Secondary | ICD-10-CM | POA: Diagnosis not present

## 2019-07-19 DIAGNOSIS — I129 Hypertensive chronic kidney disease with stage 1 through stage 4 chronic kidney disease, or unspecified chronic kidney disease: Secondary | ICD-10-CM | POA: Diagnosis not present

## 2019-07-19 DIAGNOSIS — E1122 Type 2 diabetes mellitus with diabetic chronic kidney disease: Secondary | ICD-10-CM | POA: Diagnosis not present

## 2019-07-19 DIAGNOSIS — M545 Low back pain: Secondary | ICD-10-CM | POA: Diagnosis not present

## 2019-07-19 DIAGNOSIS — X58XXXA Exposure to other specified factors, initial encounter: Secondary | ICD-10-CM | POA: Insufficient documentation

## 2019-07-19 DIAGNOSIS — G8929 Other chronic pain: Secondary | ICD-10-CM | POA: Insufficient documentation

## 2019-07-19 DIAGNOSIS — K3184 Gastroparesis: Secondary | ICD-10-CM | POA: Diagnosis not present

## 2019-07-19 DIAGNOSIS — Z794 Long term (current) use of insulin: Secondary | ICD-10-CM | POA: Diagnosis not present

## 2019-07-19 DIAGNOSIS — M19011 Primary osteoarthritis, right shoulder: Secondary | ICD-10-CM | POA: Diagnosis not present

## 2019-07-19 DIAGNOSIS — I252 Old myocardial infarction: Secondary | ICD-10-CM | POA: Diagnosis not present

## 2019-07-19 DIAGNOSIS — E1143 Type 2 diabetes mellitus with diabetic autonomic (poly)neuropathy: Secondary | ICD-10-CM | POA: Diagnosis not present

## 2019-07-19 DIAGNOSIS — M75120 Complete rotator cuff tear or rupture of unspecified shoulder, not specified as traumatic: Secondary | ICD-10-CM | POA: Insufficient documentation

## 2019-07-19 DIAGNOSIS — F418 Other specified anxiety disorders: Secondary | ICD-10-CM | POA: Diagnosis not present

## 2019-07-19 DIAGNOSIS — M7541 Impingement syndrome of right shoulder: Secondary | ICD-10-CM | POA: Insufficient documentation

## 2019-07-19 DIAGNOSIS — E1140 Type 2 diabetes mellitus with diabetic neuropathy, unspecified: Secondary | ICD-10-CM | POA: Insufficient documentation

## 2019-07-19 DIAGNOSIS — S43491A Other sprain of right shoulder joint, initial encounter: Secondary | ICD-10-CM | POA: Insufficient documentation

## 2019-07-19 DIAGNOSIS — Z791 Long term (current) use of non-steroidal anti-inflammatories (NSAID): Secondary | ICD-10-CM | POA: Diagnosis not present

## 2019-07-19 DIAGNOSIS — F17210 Nicotine dependence, cigarettes, uncomplicated: Secondary | ICD-10-CM | POA: Insufficient documentation

## 2019-07-19 DIAGNOSIS — R1115 Cyclical vomiting syndrome unrelated to migraine: Secondary | ICD-10-CM

## 2019-07-19 DIAGNOSIS — S46011D Strain of muscle(s) and tendon(s) of the rotator cuff of right shoulder, subsequent encounter: Secondary | ICD-10-CM

## 2019-07-19 LAB — GLUCOSE, CAPILLARY
Glucose-Capillary: 64 mg/dL — ABNORMAL LOW (ref 70–99)
Glucose-Capillary: 65 mg/dL — ABNORMAL LOW (ref 70–99)
Glucose-Capillary: 76 mg/dL (ref 70–99)
Glucose-Capillary: 87 mg/dL (ref 70–99)

## 2019-07-19 SURGERY — SHOULDER ARTHROSCOPY WITH SUBACROMIAL DECOMPRESSION AND DISTAL CLAVICLE EXCISION
Anesthesia: General | Site: Shoulder | Laterality: Right

## 2019-07-19 MED ORDER — CEFAZOLIN SODIUM-DEXTROSE 2-4 GM/100ML-% IV SOLN
2.0000 g | INTRAVENOUS | Status: AC
  Administered 2019-07-19: 2 g via INTRAVENOUS
  Filled 2019-07-19: qty 100

## 2019-07-19 MED ORDER — PHENOL 1.4 % MT LIQD
1.0000 | OROMUCOSAL | Status: DC | PRN
  Administered 2019-07-19: 1 via OROMUCOSAL
  Filled 2019-07-19: qty 177

## 2019-07-19 MED ORDER — OXYCODONE HCL 5 MG PO TABS: 5.0000 mg | ORAL_TABLET | ORAL | 0 refills | Status: DC | PRN

## 2019-07-19 MED ORDER — FENTANYL CITRATE (PF) 100 MCG/2ML IJ SOLN
INTRAMUSCULAR | Status: DC | PRN
  Administered 2019-07-19: 25 ug via INTRAVENOUS
  Administered 2019-07-19 (×2): 50 ug via INTRAVENOUS
  Administered 2019-07-19: 25 ug via INTRAVENOUS
  Administered 2019-07-19: 50 ug via INTRAVENOUS

## 2019-07-19 MED ORDER — MIDAZOLAM HCL 2 MG/2ML IJ SOLN
INTRAMUSCULAR | Status: AC
  Filled 2019-07-19: qty 2

## 2019-07-19 MED ORDER — HYDROMORPHONE HCL 1 MG/ML IJ SOLN
0.2500 mg | INTRAMUSCULAR | Status: DC | PRN
  Administered 2019-07-19: 16:00:00 0.5 mg via INTRAVENOUS

## 2019-07-19 MED ORDER — GLYCOPYRROLATE PF 0.2 MG/ML IJ SOSY
PREFILLED_SYRINGE | INTRAMUSCULAR | Status: DC | PRN
  Administered 2019-07-19 (×2): .1 mg via INTRAVENOUS

## 2019-07-19 MED ORDER — FENTANYL CITRATE (PF) 100 MCG/2ML IJ SOLN
INTRAMUSCULAR | Status: AC
  Filled 2019-07-19: qty 2

## 2019-07-19 MED ORDER — ROCURONIUM BROMIDE 10 MG/ML (PF) SYRINGE
PREFILLED_SYRINGE | INTRAVENOUS | Status: DC | PRN
  Administered 2019-07-19: 15 mg via INTRAVENOUS
  Administered 2019-07-19: 40 mg via INTRAVENOUS
  Administered 2019-07-19: 10 mg via INTRAVENOUS

## 2019-07-19 MED ORDER — DEXAMETHASONE SODIUM PHOSPHATE 10 MG/ML IJ SOLN
INTRAMUSCULAR | Status: DC | PRN
  Administered 2019-07-19: 10 mg via INTRAVENOUS

## 2019-07-19 MED ORDER — ONDANSETRON HCL 4 MG/2ML IJ SOLN
INTRAMUSCULAR | Status: DC | PRN
  Administered 2019-07-19: 4 mg via INTRAVENOUS

## 2019-07-19 MED ORDER — PHENYLEPHRINE 40 MCG/ML (10ML) SYRINGE FOR IV PUSH (FOR BLOOD PRESSURE SUPPORT)
PREFILLED_SYRINGE | INTRAVENOUS | Status: DC | PRN
  Administered 2019-07-19: 80 ug via INTRAVENOUS

## 2019-07-19 MED ORDER — SODIUM CHLORIDE 0.9 % IR SOLN
Status: DC | PRN
  Administered 2019-07-19: 12000 mL

## 2019-07-19 MED ORDER — MIDAZOLAM HCL 5 MG/5ML IJ SOLN
INTRAMUSCULAR | Status: DC | PRN
  Administered 2019-07-19: 1 mg via INTRAVENOUS

## 2019-07-19 MED ORDER — PROPOFOL 10 MG/ML IV BOLUS
INTRAVENOUS | Status: DC | PRN
  Administered 2019-07-19: 150 mg via INTRAVENOUS

## 2019-07-19 MED ORDER — SUCCINYLCHOLINE CHLORIDE 200 MG/10ML IV SOSY
PREFILLED_SYRINGE | INTRAVENOUS | Status: DC | PRN
  Administered 2019-07-19: 120 mg via INTRAVENOUS

## 2019-07-19 MED ORDER — LACTATED RINGERS IV SOLN
INTRAVENOUS | Status: DC
  Administered 2019-07-19: 11:00:00 via INTRAVENOUS

## 2019-07-19 MED ORDER — CHLORHEXIDINE GLUCONATE 4 % EX LIQD: 60.0000 mL | Freq: Once | CUTANEOUS | Status: DC

## 2019-07-19 MED ORDER — FENTANYL CITRATE (PF) 100 MCG/2ML IJ SOLN
50.0000 ug | Freq: Once | INTRAMUSCULAR | Status: AC
  Administered 2019-07-19: 100 ug via INTRAVENOUS
  Filled 2019-07-19: qty 2

## 2019-07-19 MED ORDER — 0.9 % SODIUM CHLORIDE (POUR BTL) OPTIME
TOPICAL | Status: DC | PRN
  Administered 2019-07-19: 1000 mL

## 2019-07-19 MED ORDER — ONDANSETRON HCL 4 MG PO TABS: 4.0000 mg | ORAL_TABLET | Freq: Three times a day (TID) | ORAL | 0 refills | Status: DC | PRN

## 2019-07-19 MED ORDER — MIDAZOLAM HCL 2 MG/2ML IJ SOLN
1.0000 mg | Freq: Once | INTRAMUSCULAR | Status: AC
  Administered 2019-07-19: 2 mg via INTRAVENOUS
  Filled 2019-07-19: qty 2

## 2019-07-19 MED ORDER — CYCLOBENZAPRINE HCL 10 MG PO TABS: 10.0000 mg | ORAL_TABLET | Freq: Three times a day (TID) | ORAL | 1 refills | Status: DC | PRN

## 2019-07-19 MED ORDER — HYDROMORPHONE HCL 1 MG/ML IJ SOLN
INTRAMUSCULAR | Status: AC
  Filled 2019-07-19: qty 1

## 2019-07-19 MED ORDER — SUGAMMADEX SODIUM 200 MG/2ML IV SOLN
INTRAVENOUS | Status: DC | PRN
  Administered 2019-07-19: 200 mg via INTRAVENOUS

## 2019-07-19 MED ORDER — EPHEDRINE SULFATE-NACL 50-0.9 MG/10ML-% IV SOSY
PREFILLED_SYRINGE | INTRAVENOUS | Status: DC | PRN
  Administered 2019-07-19 (×2): 5 mg via INTRAVENOUS

## 2019-07-19 SURGICAL SUPPLY — 58 items
ANCH SUT SWLK 19.1X4.75 VT (Anchor) ×3 IMPLANT
ANCHOR PEEK 4.75X19.1 SWLK C (Anchor) ×9 IMPLANT
BLADE EXCALIBUR 4.0MM X 13CM (MISCELLANEOUS) ×1
BLADE EXCALIBUR 4.0X13 (MISCELLANEOUS) ×2 IMPLANT
BOOTIES KNEE HIGH SLOAN (MISCELLANEOUS) ×6 IMPLANT
BURR OVAL 8 FLU 4.0MM X 13CM (MISCELLANEOUS) ×1
BURR OVAL 8 FLU 4.0X13 (MISCELLANEOUS) ×2 IMPLANT
CANNULA ACUFLEX KIT 5X76 (CANNULA) ×3 IMPLANT
CANNULA DRILOCK 5.0MMX75MM (CANNULA)
CANNULA DRILOCK 5.0X75 (CANNULA) IMPLANT
CANNULA TWIST IN 8.25X7CM (CANNULA) IMPLANT
CLOSURE WOUND 1/2 X4 (GAUZE/BANDAGES/DRESSINGS) ×1
CONNECTOR 5 IN 1 STRAIGHT STRL (MISCELLANEOUS) ×3 IMPLANT
COOLER ICEMAN CLASSIC (MISCELLANEOUS) IMPLANT
COVER WAND RF STERILE (DRAPES) ×3 IMPLANT
DISSECTOR  3.8MM X 13CM (MISCELLANEOUS) ×2
DISSECTOR 3.8MM X 13CM (MISCELLANEOUS) ×1 IMPLANT
DRAPE INCISE 23X17 IOBAN STRL (DRAPES) ×2
DRAPE INCISE IOBAN 23X17 STRL (DRAPES) ×1 IMPLANT
DRAPE INCISE IOBAN 66X45 STRL (DRAPES) ×3 IMPLANT
DRAPE ORTHO SPLIT 77X108 STRL (DRAPES) ×6
DRAPE STERI 35X30 U-POUCH (DRAPES) ×3 IMPLANT
DRAPE SURG 17X11 SM STRL (DRAPES) ×3 IMPLANT
DRAPE SURG ORHT 6 SPLT 77X108 (DRAPES) ×2 IMPLANT
DRAPE U-SHAPE 47X51 STRL (DRAPES) ×3 IMPLANT
DRSG PAD ABDOMINAL 8X10 ST (GAUZE/BANDAGES/DRESSINGS) ×6 IMPLANT
DURAPREP 26ML APPLICATOR (WOUND CARE) ×6 IMPLANT
GAUZE SPONGE 4X4 12PLY STRL (GAUZE/BANDAGES/DRESSINGS) ×3 IMPLANT
GLOVE BIO SURGEON STRL SZ7.5 (GLOVE) ×3 IMPLANT
GLOVE BIO SURGEON STRL SZ8 (GLOVE) ×3 IMPLANT
GLOVE SS BIOGEL STRL SZ 7 (GLOVE) ×2 IMPLANT
GLOVE SS BIOGEL STRL SZ 7.5 (GLOVE) ×1 IMPLANT
GLOVE SUPERSENSE BIOGEL SZ 7 (GLOVE) ×4
GLOVE SUPERSENSE BIOGEL SZ 7.5 (GLOVE) ×2
GOWN STRL REUS W/TWL LRG LVL3 (GOWN DISPOSABLE) ×6 IMPLANT
KIT BASIN OR (CUSTOM PROCEDURE TRAY) ×3 IMPLANT
KIT SHOULDER TRACTION (DRAPES) ×3 IMPLANT
KIT TURNOVER KIT A (KITS) IMPLANT
MANIFOLD NEPTUNE II (INSTRUMENTS) ×3 IMPLANT
NEEDLE SCORPION MULTI FIRE (NEEDLE) IMPLANT
NS IRRIG 1000ML POUR BTL (IV SOLUTION) ×3 IMPLANT
PACK ARTHROSCOPY DSU (CUSTOM PROCEDURE TRAY) ×3 IMPLANT
PAD ARMBOARD 7.5X6 YLW CONV (MISCELLANEOUS) ×3 IMPLANT
PAD COLD SHLDR WRAP-ON (PAD) IMPLANT
PROBE APOLLO 90XL (SURGICAL WAND) ×3 IMPLANT
SLING ARM FOAM STRAP MED (SOFTGOODS) IMPLANT
SPONGE LAP 4X18 RFD (DISPOSABLE) ×3 IMPLANT
STRIP CLOSURE SKIN 1/2X4 (GAUZE/BANDAGES/DRESSINGS) ×2 IMPLANT
SUT FIBERWIRE #2 38 T-5 BLUE (SUTURE)
SUT MNCRL AB 3-0 PS2 18 (SUTURE) ×3 IMPLANT
SUT PDS AB 0 CT 36 (SUTURE) IMPLANT
SUT TIGER TAPE 7 IN WHITE (SUTURE) ×3 IMPLANT
SUTURE FIBERWR #2 38 T-5 BLUE (SUTURE) IMPLANT
TAPE CLOTH SOFT 2X10 (GAUZE/BANDAGES/DRESSINGS) ×3 IMPLANT
TAPE FIBER 2MM 7IN #2 BLUE (SUTURE) IMPLANT
TAPE PAPER 3X10 WHT MICROPORE (GAUZE/BANDAGES/DRESSINGS) ×3 IMPLANT
TOWEL OR 17X26 10 PK STRL BLUE (TOWEL DISPOSABLE) ×3 IMPLANT
WATER STERILE IRR 1000ML POUR (IV SOLUTION) ×3 IMPLANT

## 2019-07-19 NOTE — Op Note (Signed)
07/19/2019  2:32 PM  PATIENT:   [name] [name]  53 y.o. female  PRE-OPERATIVE DIAGNOSIS:  Right shoulder chronic impingement, acromioclavicular osteoarthritis, possible rotator cuff tear  POST-OPERATIVE DIAGNOSIS:  1.  Chronic right shoulder impingement syndrome  2.  Right shoulder symptomatic AC joint arthropathy  3.  Full-thickness rotator cuff tear  4.  Degenerative labral tear  PROCEDURE:  1.  Right shoulder examination under esthesia  2.  Right shoulder glenohumeral joint diagnostic arthroscopy  3.  Debridement of degenerative labral tear  4.  Arthroscopic subacromial decompression and bursectomy  5.  Arthroscopic distal clavicle resection  6.  Arthroscopic rotator cuff repair using a double row suture bridge repair construct  SURGEON:  Blaze Sandin, Metta Clines M.D.  ASSISTANTS: Jenetta Loges, PA-C  ANESTHESIA:   General endotracheal and interscalene block with Exparel  EBL: Minimal  SPECIMEN: None  Drains: None   PATIENT DISPOSITION:  PACU - hemodynamically stable.    PLAN OF CARE: Discharge to home after PACU  Brief history:  [name] [name] is a 53 year old female who has had chronic and progressively increasing pain in the right shoulder related to impingement syndrome with recent MRI scan confirming [facility] joint arthropathy as well as severe tendinosis and partial versus occult full-thickness tearing of the rotator cuff.  Due to her ongoing pain and functional mutations and failure to respond to conservative management she is brought to the operating this time for planned right shoulder arthroscopy as described below  Preoperatively I counseled [name] [name] regarding treatment options as well as the potential risks versus benefits thereof.  Possible surgical complications were reviewed including bleeding, infection, neurovascular injury, persistent pain, anesthetic complication, and possible need for additional surgery.  She understands, accepts, and agrees with her  planned procedure.  Procedure in detail:  After undergoing routine preop evaluation patient received prophylactic antibiotics and interscalene block with Exparel was established in the holding area by the anesthesia department.  Placed supine on the op table and underwent the smooth induction of a general endotracheal anesthesia.  Turned to the left lateral decubitus position on the beanbag and appropriately padded protected.  Right shoulder examination under anesthesia revealed full motion.  No instability patterns noted.  Right arm suspended at 70 degrees of abduction with 10 pounds of traction in the right shoulder girdle region was sterilely prepped and draped in standard fashion.  Timeout was called.  A posterior portal established into the glenohumeral joint and an anterior portal established under direct visualization.  The articular surfaces showed some diffuse chondral thinning on the posterior superior aspect of the humeral head as well as diffusely on the inferior glenoid although there was certainly no dominant focal chondral lesions but just generalized thinning.  There was degenerative tearing of the anterior and superior labrum which was debrided with a shaver to a peripheral margin of stable tissue.  Biceps tendon showed some tenosynovitis and some superficial fraying distally but no instability and overall good caliber and quality tissue and the biceps anchor was stable.  Rotator cuff did show a significant adamantinoma partial articular sided tear which we debrided and then passed a tag suture of 0 PDS.  Balance of the glenohumeral joint showed no additional pathologies.  Fluid and instruments were then removed the arm was dropped down to 30 degrees of abduction with the arthroscope introduced in the subacromial space of the posterior portal and a direct lateral portal established in the subacromial space.  Abundant dense bursal tissue with multiple adhesions were encountered and  these were  divided and excised with combination the shaver and Stryker wand.  The wand was then used to remove the periosteum from the undersurface of the anterior half of the acromion and a subacromial decompression was performed with a bur creating a type I morphology.  A portal was then established directly anterior to the distal clavicle and the distal clavicle resection was performed with a bur, removing approximate 8 mm of bone with care taken to confirm visualization of the entire circumference of the distal clavicle to ensure adequate removal of bone.  We then completed the subacromial/subdeltoid bursectomy.  Rotator cuff tear was readily identified from the bursal surface and this clearly was a full-thickness defect and sore tag suture was removed the shaver was then used to abrade the rotator cuff tendon back to healthy tissue and we abraded the greater tuberosity removing soft tissue and abrading with a bur to bleeding bed with footprint approximate 2 cm in length just posterior to the bicep tendon bicipital groove.  Once we confirmed good mobility of the rotator cuff we placed a 4.5 Arthrex peek swivel lock suture anchor through a stab wound up on the margin of the acromion at the center of the footprint at the osteoarticular margin and this anchor was loaded with a single fiber tape.  The 4 suture limbs were then shuttled equidistant across the width of the rotator cuff tear using the scorpion suture passer and then they were passed into 2 Lateral Row anchors in an alternating fashion creating a double row repair and this allowed excellent apposition of the rotator cuff tendon against the bony bed and tuberosity and the overall construct was much to our satisfaction.  Suture limbs were then clipped.  Final irrigation and debridement was completed hemostasis was obtained.  Fluid and instruments removed.  The portals were closed with Monocryl and a Steri-Strip.  A dry dressing taped about the right shoulder right  and was placed into a sling immobilizer with abduction pillow and the patient was awakened extubated taken to the recovery room in stable condition.  Jenetta Loges, PA-C was used as an Environmental consultant throughout this case essential for help with positioning the patient, positioning extremity, tissue manipulation, suture management, wound closure, and intraoperative decision making.  Metta Clines Demian Maisel MD   Contact # [phone]

## 2019-07-19 NOTE — Anesthesia Procedure Notes (Signed)
Anesthesia Regional Block: Interscalene brachial plexus block   Pre-Anesthetic Checklist: ,, timeout performed, Correct Patient, Correct Site, Correct Laterality, Correct Procedure, Correct Position, site marked, Risks and benefits discussed,  Surgical consent,  Pre-op evaluation,  At surgeon's request and post-op pain management  Laterality: Right  Prep: chloraprep       Needles:   Needle Type: Stimulator Needle - 40          Additional Needles:   Procedures: Doppler guided, nerve stimulator,,, ultrasound used (permanent image in chart),,,,   Nerve Stimulator or Paresthesia:  Response: 0.5 mA,   Additional Responses:   Narrative:  Start time: 07/19/2019 11:15 AM End time: 07/19/2019 11:30 AM Injection made incrementally with aspirations every 5 mL.  Performed by: Personally  Anesthesiologist: Belinda Block, MD

## 2019-07-19 NOTE — H&P (Signed)
[name] [name]    Chief Complaint: Right shoulder chronic impingement, acromioclavicular osteoarthritis, possible rotator cuff tear HPI: The patient is a 53 y.o. female with chronic right shoulder pain and impingement syndrome which has been refractory to prolonged attempts at conservative management.  Due to her ongoing pain and functional mentation she is brought to the operating this time for planned right shoulder arthroscopy  Past Medical History:  Diagnosis Date  . Abdominal pain, generalized   . Acquired bilateral hammer toes 01/20/2017  . Acute post-traumatic stress disorder 11/17/2018  . Allergy   . Anemia   . Anxiety   . Arthritis   . At risk for polypharmacy 08/15/2014  . Biceps tendinopathy of right upper extremity 04/05/2014  . Blood transfusion 2003  . Cataract    both eyes  . Chronic generalized abdominal pain   . Chronic generalized abdominal pain   . Chronic kidney disease (CKD), stage III (moderate) (HCC)   . Chronic leg cramping 07/18/2014  . CKD (chronic kidney disease) 08/17/2013   Seen by Dr Joelyn Oms 07/28/15 ([place]) - (see scanned document): 20 years diabetes, 15 years HTN, stable CKD stage III with proteinuria.  - CKD, discussed NSAID avoidance, diabetes control with target A1c<7.5%, and HTN control with SBP<140. F/u 6 months and repeat renal panel at that time. - Microalbuminuria: On lisinopril 40mg , suspected secondary due to diabetic nephropathy. - Chronic back pain: No NSDAIDs. - Say amlodipine causes nausea  - Advised not to ACE-i during vomiting episodes    . Complication of anesthesia    "problems waking up"  . Cyclic vomiting syndrome   . Delayed gastric emptying 05/12/2011    04/12/11 NUCLEAR MEDICINE GASTRIC EMPTYING SCAN Radiopharmaceutical: 2.0 mCi Tc-77m sulfur colloid. Comparison: None. Findings: At 1 hour, 81% of the counts remain in the stomach. At 2 hours, 53% remain. Normal is less than 30% at 2 hours. IMPRESSION: Moderate delay  in gastric emptying.      . Depression   . Depression with anxiety 02/23/2007   See [facility] dated 05/06/2016.  IC Gwenlyn Saran) was called in and I did a brief assessment.   . Diabetic gastroparesis ([place])    /e-chart  . Diabetic ketoacidosis without coma associated with type 2 diabetes mellitus ([place])   . DKA (diabetic ketoacidoses) ([place]) 08/03/2016  . Dysuria 10/01/2016  . Elevated cholesterol 11/28/2015  . Emotional stress 12/30/2015   Stress after Breakup with partnes of 13 years on 12/27/2015   . Encounter for chronic pain management 01/06/2015   Indication for chronic opioid: Chronic back pain, cyclic vomiting Medication and dose: oxycodone 5mg   # pills per month: 15 Last UDS date: 05/08/2015 - result pending Pain contract signed (Y/N): Yes, reviewed 01/06/2015  Date narcotic database last reviewed (include red flags): 05/08/2015 (no red flags).    . Esophagitis 04/18/2013   Documented by Dr. Henrene Pastor EGD 03/2011  He also consulted on her during hospitalization 54/0086 for cyclical vomiting. No repeat EGD necessary, treat esophagitis with PPI.    Marland Kitchen Essential hypertension 05/01/2016  . Essential hypertension, malignant 02/23/2007   Jan 2015 - stopped recently-started norvasc due to reports of GI symptoms. 02/2015 - holding lisinopril, adding hydralazine 10mg  TID, and will take 1/2 tab lisinopril when feeling better when not in cyclic vomiting flare   . Essential hypertension, malignant 02/23/2007   Jan 2015 - stopped recently-started norvasc due to reports of GI symptoms. 02/2015 - holding lisinopril, adding hydralazine 10mg  TID, and will take 1/2 tab  lisinopril when feeling better when not in cyclic vomiting flare   . GERD (gastroesophageal reflux disease)   . Heart murmur   . Hematuria 07/09/2015  . High risk social situation 08/31/2012  . History of colonic polyps 11/15/2018   10/2017 screening colonoscopy Dr Scarlette Shorts: Seven polyps were found in the sigmoid colon, transverse colon, ascending colon and  cecum. The polyps were 2 to 10 mm in size. These polyps were removed with a cold snare.   Marland Kitchen Hot flashes 07/29/2011  . Hypertension   . Incontinence 10/03/2017  . INSOMNIA NOS 02/23/2007   Qualifier: Diagnosis of  By: Beryle Lathe    . Intermittent constipation 06/06/2015  . Intestinal impaction ([place])   . Intractable vomiting 07/02/2016  . Marijuana smoker, continuous    pt. denies does smoke marijuana  . MIGRAINE, UNSPEC., W/O INTRACTABLE MIGRAINE 02/23/2007   Qualifier: Diagnosis of  By: Beryle Lathe    . Myocardial infarction [facility]) 07/2007   "they say I've had a silent one; I don't know"  . Nausea and vomiting in adult 06/06/2013  . Neuromuscular disorder (HCC)    neuropathy in both legs  . NEUROPATHY, DIABETIC 02/23/2007  . Nonspecific low back pain 04/10/2007   Back pain is chronic. Oxycodone per prior notes helps pt be more active. Reportedly uses 4 tab/week oxy for intermittent worsening of chronic back pain but mostly for cyclic vom to keep her out of hospital.    . Normocytic anemia   . Onychomycosis 03/25/2015  . PANCREATITIS 05/09/2008  . Pneumonia   . Protein-calorie malnutrition, severe ([place]) 12/17/2013  . Pure hypercholesterolemia 08/15/2014   Based on her choleserol, BP, smoker status, and that she is a diabetic, her 10 year ASCVD risk is 9.9% putting her in a category of risk that would benefit from high-intensity statin.    . Rotator cuff syndrome 04/19/2014   rt. shoulder  . Smoker   . TOBACCO DEPENDENCE 02/23/2007   2 cigarettes every few days 11/2012 3 cigarettes daily    . Type II diabetes mellitus ([place])   . Uncontrolled secondary diabetes mellitus with stage 3 CKD (GFR 30-59) (HCC) 10/23/2011   Nephropathy, gastroparesis  HgbA1c (06/2011) 7.6, 9.5, 12.2, 8.2, 10.9 (07/2012), 9.2 (02/11/2013), 7.7 (09/27/13)  Metformin: she has a history of cyclical vomiting; she would prefer not to take this medication  On lanuts 20U qam with improved control. 09/27/13   . Vitamin D  deficiency 11/28/2014    Past Surgical History:  Procedure Laterality Date  . ABDOMINAL HYSTERECTOMY  02/2002  . CARDIAC CATHETERIZATION    . CATARACT EXTRACTION W/ INTRAOCULAR LENS  IMPLANT, BILATERAL    . CESAREAN SECTION     x 3  . CHOLECYSTECTOMY  1980's  . COLONOSCOPY     patient not sure  . DILATION AND EVACUATION  X3  . laparotomy and lysis of adhesions    . left hand surgery    . PORT-A-CATH placement  ~ 2008   right chest; "poor access; frequent hospitalizations"  . SMALL INTESTINE SURGERY     laporotomy with lysis of adhesions.  Marland Kitchen UPPER GASTROINTESTINAL ENDOSCOPY    . UPPER GI ENDOSCOPY      Family History  Problem Relation Age of Onset  . Diabetes type II Mother   . Hypertension Mother   . Heart attack Mother   . Other Mother        Leg amputation  . Kidney disease Mother        Required  dialysis 10 years  . Diabetes type II Sister   . Diabetes Sister   . Hypertension Sister   . Alcohol abuse Father   . Diabetes Sister   . Diabetes Sister   . Cancer Sister 1       Brain  . Colon cancer Neg Hx   . Colon polyps Neg Hx   . Esophageal cancer Neg Hx   . Rectal cancer Neg Hx   . Stomach cancer Neg Hx     Social History:  reports that she has been smoking cigarettes. She started smoking about 36 years ago. She has a 11.00 pack-year smoking history. She has never used smokeless tobacco. She reports current alcohol use. She reports current drug use. Drug: Marijuana.   Medications Prior to Admission  Medication Sig Dispense Refill  . AMITIZA 24 MCG capsule TAKE 1 CAPSULE(24 MCG) BY MOUTH TWICE DAILY WITH A MEAL (Patient taking differently: Take 24 mcg by mouth 2 (two) times daily with a meal. ) 90 capsule 3  . amLODipine (NORVASC) 5 MG tablet Take 5 mg by mouth at bedtime.  90 tablet 3  . carvedilol (COREG) 6.25 MG tablet TAKE 1 TABLET BY MOUTH TWICE DAILY WITH MEALS (Patient taking differently: Take 6.25 mg by mouth 2 (two) times daily with a meal. ) 180 tablet  1  . cyclobenzaprine (FLEXERIL) 5 MG tablet Take 1 tablet (5 mg total) by mouth 3 (three) times daily as needed for muscle spasms. 30 tablet 1  . diclofenac sodium (VOLTAREN) 1 % GEL Apply 4 g topically 4 (four) times daily. Apply to shoulder (Patient taking differently: Apply 4 g topically 4 (four) times daily as needed (shoulder pain). ) 100 g 5  . glucose blood test strip Accu-Chek Aviva strips. Check blood glucose 3 times a day. 100 each 12  . Insulin Glargine (LANTUS SOLOSTAR) 100 UNIT/ML Solostar Pen Inject 20 Units into the skin daily. (Patient taking differently: Inject 20 Units into the skin at bedtime. ) 6 pen PRN  . Insulin Pen Needle (B-D UF III MINI PEN NEEDLES) 31G X 5 MM MISC Check CBGs three times a day for E11.9 100 each 3  . LORazepam (ATIVAN) 1 MG tablet Take 1 tablet (1 mg total) by mouth every 6 (six) hours as needed (cyclic vomiting). 35 tablet 5  . oxyCODONE (OXY IR/ROXICODONE) 5 MG immediate release tablet Take 1 tablet (5 mg total) by mouth every 4 (four) hours as needed for severe pain. 60 tablet 0  . psyllium (METAMUCIL) 58.6 % powder Take 1 packet by mouth daily.    . famotidine (PEPCID) 20 MG tablet Take 0.5 tablets (10 mg total) by mouth daily. (Patient not taking: Reported on 05/22/2019) 30 tablet 0  . ondansetron (ZOFRAN ODT) 4 MG disintegrating tablet Take 1 tablet (4 mg total) by mouth every 8 (eight) hours as needed for nausea or vomiting. (Patient not taking: Reported on 05/22/2019) 3 tablet 0  . phenazopyridine (PYRIDIUM) 100 MG tablet Take 1 tablet (100 mg total) by mouth 3 (three) times daily as needed for pain. 10 tablet 0  . promethazine (PHENERGAN) 12.5 MG tablet Take 1 tablet (12.5 mg total) by mouth every 6 (six) hours as needed for nausea or vomiting. 40 tablet 3     Physical Exam: Right shoulder demonstrates a guarded and painful range of motion with global weakness secondary to pain and guarding as noted at her recent office visit  Recent right shoulder  MRI scan shows severe bony impingement  with rotator cuff tendinosis but no obvious discrete full-thickness rotator cuff tear.  AC joint arthritis is noted as well  Vitals  Temp:  [97.9 F (36.6 C)] 97.9 F (36.6 C) (07/23 1059) Pulse Rate:  [51-61] 57 (07/23 1145) Resp:  [8-19] 14 (07/23 1145) BP: (167-198)/(72-84) 167/73 (07/23 1140) SpO2:  [100 %] 100 % (07/23 1145) Weight:  [66.5 kg] 66.5 kg (07/23 1059)  Assessment/Plan  Impression: Right shoulder chronic impingement, acromioclavicular osteoarthritis, possible rotator cuff tear  Plan of Action: Procedure(s): SHOULDER ARTHROSCOPY WITH SUBACROMIAL DECOMPRESSION AND DISTAL CLAVICLE RESECTION, POSSIBLE ROTATOR CUFF REPAIR  Amiee Wiley M Simone Tuckey 07/19/2019, 12:24 PM Contact # [phone]

## 2019-07-19 NOTE — Transfer of Care (Signed)
Immediate Anesthesia Transfer of Care Note  Patient: DONNELL BEAUCHAMP  Procedure(s) Performed: SHOULDER ARTHROSCOPY WITH SUBACROMIAL DECOMPRESSION AND DISTAL CLAVICLE RESECTION, WITH ROTATOR CUFF REPAIR (Right Shoulder)  Patient Location: PACU  Anesthesia Type:GA combined with regional for post-op pain  Level of Consciousness: drowsy, patient cooperative and responds to stimulation  Airway & Oxygen Therapy: Patient Spontanous Breathing and Patient connected to face mask oxygen  Post-op Assessment: Report given to RN and Post -op Vital signs reviewed and stable  Post vital signs: Reviewed and stable  Last Vitals:  Vitals Value Taken Time  BP 107/87 07/19/19 1500  Temp    Pulse 67 07/19/19 1501  Resp 14 07/19/19 1458  SpO2 100 % 07/19/19 1501  Vitals shown include unvalidated device data.  Last Pain:  Vitals:   07/19/19 1059  TempSrc: Oral         Complications: No apparent anesthesia complications

## 2019-07-19 NOTE — Discharge Instructions (Signed)
   Kevin M. Supple, M.D., F.A.A.O.S. Orthopaedic Surgery Specializing in Arthroscopic and Reconstructive Surgery of the Shoulder [phone] [address] - [place], [place] [address] - Fax [phone]  POST-OP SHOULDER ARTHROSCOPIC ROTATOR CUFF  REPAIR INSTRUCTIONS  1. Call the office at [phone] to schedule your first post-op appointment 7-10 days from the date of your surgery.  2. Leave the steri-strips in place over your incisions when performing dressing changes and showering. You may remove your dressings and begin showering 72 hours from surgery. You can expect drainage that is clear to bloody in nature that occasionally will soak through your dressings. If this occurs go ahead and perform a dressing change. The drainage should lessen daily and when there is no drainage from your incisions feel free to go without a dressing.  3. Wear your sling/immobilizer at all times except to perform the exercises below or to occasionally let your arm dangle by your side to stretch your elbow. You also need to sleep in your sling immobilizer until instructed otherwise.  4. Range of motion to your elbow, wrist, and hand are encouraged 3-5 times daily. Exercise to your hand and fingers helps to reduce swelling you may experience.  5. Utilize ice to the shoulder 3-4 times minimum a day and additionally if you are experiencing pain.  6. You may one-armed drive when safely off of narcotics and muscle relaxants. You may use your hand that is in the sling to support the steering wheel only. However, should it be your right arm that is in the sling it is not to be used for gear shifting in a manual transmission.  7. Pain control following an exparel block  To help control your post-operative pain you received a nerve block  performed with Exparel which is a long acting anesthetic (numbing agent) which can provide pain relief and sensations of numbness (and relief of pain) in the operative  shoulder and arm for up to 3 days. Sometimes it provides mixed relief, meaning you may still have numbness in certain areas of the arm but can still be able to move  parts of that arm, hand, and fingers. We recommend that your prescribed pain medications  be used as needed. We do not feel it is necessary to "pre medicate" and "stay ahead" of pain.  Taking narcotic pain medications when you are not having any pain can lead to unnecessary and potentially dangerous side effects.    8. Pain medications can produce constipation along with their use. If you experience this, the use of an over the counter stool softener or laxative daily is recommended.   9. For additional questions or concerns, please do not hesitate to call the office. If after hours there is an answering service to forward your concerns to the physician on call.  POST-OP EXERCISES  Pendulum Exercises  Perform pendulum exercises while standing and bending at the waist. Support your uninvolved arm on a table or chair and allow your operated arm to hang freely. Make sure to do these exercises passively - not using you shoulder muscle.  Repeat 20 times. Do 3 sessions per day.   

## 2019-07-19 NOTE — Anesthesia Postprocedure Evaluation (Signed)
Anesthesia Post Note  Patient: [name] [name]  Procedure(s) Performed: SHOULDER ARTHROSCOPY WITH SUBACROMIAL DECOMPRESSION AND DISTAL CLAVICLE RESECTION, WITH ROTATOR CUFF REPAIR (Right Shoulder)     Patient location during evaluation: PACU Anesthesia Type: General Level of consciousness: awake and alert Pain management: pain level controlled Vital Signs Assessment: post-procedure vital signs reviewed and stable Respiratory status: spontaneous breathing, nonlabored ventilation, respiratory function stable and patient connected to nasal cannula oxygen Cardiovascular status: blood pressure returned to baseline and stable Postop Assessment: no apparent nausea or vomiting Anesthetic complications: no    Last Vitals:  Vitals:   07/19/19 1511 07/19/19 1645  BP:  (!) 162/78  Pulse: 67   Resp:    Temp:  36.4 C  SpO2:      Last Pain:  Vitals:   07/19/19 1645  TempSrc:   PainSc: 2                  Effie Berkshire

## 2019-07-19 NOTE — Progress Notes (Signed)
Assisted Dr. Nyoka Cowden with right, ultrasound guided, interscalene  block. Side rails up, monitors on throughout procedure. See vital signs in flow sheet. Tolerated Procedure well.

## 2019-07-19 NOTE — Anesthesia Procedure Notes (Signed)
Procedure Name: Intubation Date/Time: 07/19/2019 1:20 PM Performed by: Silas Sacramento, CRNA Pre-anesthesia Checklist: Patient identified, Emergency Drugs available, Suction available and Patient being monitored Patient Re-evaluated:Patient Re-evaluated prior to induction Oxygen Delivery Method: Circle system utilized Preoxygenation: Pre-oxygenation with 100% oxygen Induction Type: IV induction and Rapid sequence Laryngoscope Size: Mac and 3 Grade View: Grade I Tube type: Oral Tube size: 7.0 mm Number of attempts: 1 Airway Equipment and Method: Stylet Placement Confirmation: ETT inserted through vocal cords under direct vision,  positive ETCO2 and breath sounds checked- equal and bilateral Secured at: 22 cm Tube secured with: Tape Dental Injury: Teeth and Oropharynx as per pre-operative assessment

## 2019-07-20 ENCOUNTER — Other Ambulatory Visit: Payer: Self-pay | Admitting: Family Medicine

## 2019-07-20 DIAGNOSIS — E1143 Type 2 diabetes mellitus with diabetic autonomic (poly)neuropathy: Secondary | ICD-10-CM

## 2019-07-20 DIAGNOSIS — R1115 Cyclical vomiting syndrome unrelated to migraine: Secondary | ICD-10-CM

## 2019-07-21 HISTORY — PX: SHOULDER ARTHROSCOPY: SHX128

## 2019-07-23 ENCOUNTER — Encounter: Payer: Self-pay | Admitting: Family Medicine

## 2019-07-23 NOTE — Telephone Encounter (Signed)
Reviewed PDMP. While patient received 30 tablets of oxycodone 5 mg from orthopedics for acute pain presumably after her scheduled shoulder surgery, I do see evidence of doctor shopping.    I refilled her prescription for lorazepam 1 mg disp #35 per month with 5 refills. The lorazepam is part of her abortive therapy for recurrent cyclic vomiting.

## 2019-07-31 ENCOUNTER — Telehealth: Payer: Self-pay | Admitting: *Deleted

## 2019-07-31 NOTE — Telephone Encounter (Signed)
Patient has GI intolerance to Statins.  Please contact [facility] to let them know the reason the patient is not on statin.

## 2019-07-31 NOTE — Telephone Encounter (Signed)
Received call from [facility]. They show that pt is diabetic and is not on a statin.  They would liket o make the provider aware of this. You can give them a callback if needed.   Ref# [id number]. Christen Bame, CMA

## 2019-07-31 NOTE — Telephone Encounter (Signed)
[facility] contacted and I spoke with Amy and updated her on patient's GI intolerance.  She said they would notate this.  Richrd Kuzniar,CMA

## 2019-08-02 ENCOUNTER — Other Ambulatory Visit: Payer: Self-pay

## 2019-08-02 ENCOUNTER — Ambulatory Visit (INDEPENDENT_AMBULATORY_CARE_PROVIDER_SITE_OTHER): Payer: 59 | Admitting: Family Medicine

## 2019-08-02 ENCOUNTER — Encounter: Payer: Self-pay | Admitting: Family Medicine

## 2019-08-02 VITALS — BP 130/64 | HR 60

## 2019-08-02 DIAGNOSIS — K5909 Other constipation: Secondary | ICD-10-CM

## 2019-08-02 DIAGNOSIS — R1115 Cyclical vomiting syndrome unrelated to migraine: Secondary | ICD-10-CM | POA: Diagnosis not present

## 2019-08-02 DIAGNOSIS — E7800 Pure hypercholesterolemia, unspecified: Secondary | ICD-10-CM

## 2019-08-02 DIAGNOSIS — F413 Other mixed anxiety disorders: Secondary | ICD-10-CM

## 2019-08-02 DIAGNOSIS — E1142 Type 2 diabetes mellitus with diabetic polyneuropathy: Secondary | ICD-10-CM

## 2019-08-02 DIAGNOSIS — Z79891 Long term (current) use of opiate analgesic: Secondary | ICD-10-CM

## 2019-08-02 DIAGNOSIS — E118 Type 2 diabetes mellitus with unspecified complications: Secondary | ICD-10-CM

## 2019-08-02 DIAGNOSIS — Z5181 Encounter for therapeutic drug level monitoring: Secondary | ICD-10-CM

## 2019-08-02 DIAGNOSIS — E1143 Type 2 diabetes mellitus with diabetic autonomic (poly)neuropathy: Secondary | ICD-10-CM

## 2019-08-02 DIAGNOSIS — K3184 Gastroparesis: Secondary | ICD-10-CM

## 2019-08-02 DIAGNOSIS — K5900 Constipation, unspecified: Secondary | ICD-10-CM

## 2019-08-02 DIAGNOSIS — F17200 Nicotine dependence, unspecified, uncomplicated: Secondary | ICD-10-CM

## 2019-08-02 DIAGNOSIS — K2210 Ulcer of esophagus without bleeding: Secondary | ICD-10-CM

## 2019-08-02 MED ORDER — OXYCODONE HCL 5 MG PO TABS: 5.0000 mg | ORAL_TABLET | ORAL | 0 refills | Status: AC | PRN

## 2019-08-02 MED ORDER — LUBIPROSTONE 24 MCG PO CAPS: ORAL_CAPSULE | ORAL | 3 refills | Status: DC

## 2019-08-02 MED ORDER — OXYCODONE HCL 5 MG PO TABS: 5.0000 mg | ORAL_TABLET | ORAL | 0 refills | Status: DC | PRN
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
## 2019-08-02 NOTE — Patient Instructions (Signed)
Dr Hartley Wyke will see about getting you scheduled for a Medicare Annual Wellness Visit.  At your next visit, we will discuss returning the oxycodone tablet number to 45 a month.  Dr Tayon Parekh would also like to discuss starting a cholesterol lowering medicine, atorvastatin.

## 2019-08-06 ENCOUNTER — Encounter: Payer: Self-pay | Admitting: Family Medicine

## 2019-08-06 DIAGNOSIS — K2210 Ulcer of esophagus without bleeding: Secondary | ICD-10-CM

## 2019-08-06 HISTORY — DX: Ulcer of esophagus without bleeding: K22.10

## 2019-08-06 NOTE — Assessment & Plan Note (Signed)
Contemplative Counseling and offer assistance given.

## 2019-08-06 NOTE — Assessment & Plan Note (Signed)
Established problem Controlled Continue current therapy regiment. Will continue with 2 more months of oxycodone-apap 5/325 # 33 for s/p right rotator cuff repair. Patient informed and accepted that prescription would return to  #45 tab / month for cyclic vomiting abortive therapy.

## 2019-08-06 NOTE — Assessment & Plan Note (Addendum)
Lab Results  Component Value Date   HGBA1C 6.5 (H) 07/16/2019   Adequate glycemic control in this established disease.  Patient has self-titrated Lantus to a range of 10 - 14 units most days.   Patient referred to [facility] 05/14/19.  Need to see if patient went to app't and if get ROI.  Once Ms Boulay feels she is coping well with her current anxiety / stressors, she is willing to start a trial of statin. Would start with atorvastatin 10 mg weekly, then gradually advance with stretch goal of moderate daily dose of statin.

## 2019-08-06 NOTE — Assessment & Plan Note (Signed)
Established problem Controlled Continue current therapy regiment.  

## 2019-08-06 NOTE — Assessment & Plan Note (Signed)
Established problem Controlled Continue current therapy regiment of Pepcid 10 mg daily

## 2019-08-06 NOTE — Assessment & Plan Note (Signed)
New exacerbation Working differential: Adjustment disorder with anxious mood from anniversary reaction to violent death of her son VS PTSD from violent death of son.  [name] [name] denies thoughts of harming herself or others.   Patient requesting counseling, preferably one which could be a long-term therapeutic relation.  She has benefited from counseling in the past.

## 2019-08-07 ENCOUNTER — Telehealth: Payer: Self-pay | Admitting: Licensed Clinical Social Worker

## 2019-08-07 NOTE — Telephone Encounter (Signed)
  Care Coordination  Clinical Social Work Initial Note  Review of patient status, including review of consultants reports, relevant laboratory and other test results, and collaboration with appropriate care team members and the patient's provider was performed as part of comprehensive patient evaluation and provision of chronic care management services.  08/07/2019 Name: [name] [name] MRN: [id number] DOB: 1966-05-01  Referred by: McDiarmid, Blane Ohara, MD Reason for referral : Care Coordination (counselor)  [name] [name] is a 53 y.o. year old female who is a primary care patient of McDiarmid, Blane Ohara, MD.  LCSW was consulted to assist the patient with  care coordination needs.  phone encounter with patient today for assessment of needs and barriers. Patient's demographic verified.   Report of symptoms/concerns: feeling down,  Duration of problem/ how impacting : on and off several years Family and Social: lives alone has support of daughter School/Work: stopped working at [employer] in March due to [employer] Strengths: Ability for insight Capable of independent living Communication skills Support System: Supportive Relationships  ASSESSMENT: [name] [name] is a 53 y.o. year old female who sees McDiarmid, Blane Ohara, MD for primary care. Patient is currently experiencing symptoms of  complicated grief and adjustment reaction from the death of her brother and approaching the one year anniversry of the death of her son.   Goals:Reduce symptoms of: depression and get connected with a counselor to meet ongoing therapy needs.  Patient Recommendations: Patient may benefit from and is in agreement to receive further assessment and therapeutic interventions to assist with managing her symptoms. Patient is in agreement to contact her insurance provider for a list of in-network providers in [place].  INTERVENTION: Patient interviewed and appropriate assessments performed. Review of patient consultants notes  from appropriate care team members was performed as part of provision for care coordination referral. .  Other interventions include: Supportive Counseling and emotional support  Follow Up Plan: 1. Patient will call LCSW when she receives the in-network list so that LCSW can assist with narrowing down to at least two providers. 2.  SW will follow up with patient by phone over the next 10 days if have not received call from patient.  McDiarmid, Blane Ohara, MD has been notified of recommendations and Ms. Grayland Ormond Rostron's plan.   [name], LCSW Clinical Social Worker [place] / [place]   [phone] 10:26 AM

## 2019-08-08 ENCOUNTER — Ambulatory Visit: Payer: 59 | Admitting: Cardiovascular Disease

## 2019-08-09 DIAGNOSIS — M25511 Pain in right shoulder: Secondary | ICD-10-CM | POA: Diagnosis not present

## 2019-08-15 ENCOUNTER — Telehealth: Payer: Self-pay | Admitting: Licensed Clinical Social Worker

## 2019-08-15 NOTE — Telephone Encounter (Signed)
  Care Coordination  Clinical Social Work Follow Up Note 08/15/2019 Name: [name] [name] MRN: [id number] DOB: 1966-02-28  Referred by: McDiarmid, Blane Ohara, MD  Reason for referral : Care Coordination (F/U call) ,  [name] [name] is a 53 y.o. year old female who is a primary care patient of McDiarmid, Blane Ohara, MD.    Reason for follow-up: phone encounter with patient today for ongoing assessment and brief interventions to assist with managing complicated grief, family stressors and care coordination needs related to connecting to ongoing therapy.    Assessment: Patient continues to experience difficulty with managing stress which is exacerbated by relationship difficulties with her daughter and complicated grief for son.     Patient has contacted her insurance provider and was given in-network therapist.    Recommendation: Patient may benefit from, and is in agreement to contact Garrel Ridgel, therapist in [place].   Interventions: Solution-Focused Strategies, Psychoeducation and Referral to Counselor/Psychotherapist  As well as emotional support.  Plan: SW will follow up with patient by phone over the next 3 weeks Patient will contact therapist to scheduled appointment and F/U with LCSW after first appointment.  McDiarmid, Blane Ohara, MD has been notified of patient's recommendations and plan.  [name], LCSW Clinical Social Worker [place] / [place]   [phone] 10:45 AM

## 2019-08-22 DIAGNOSIS — M25511 Pain in right shoulder: Secondary | ICD-10-CM | POA: Diagnosis not present

## 2019-08-27 ENCOUNTER — Encounter: Payer: Self-pay | Admitting: Family Medicine

## 2019-08-30 DIAGNOSIS — M25511 Pain in right shoulder: Secondary | ICD-10-CM | POA: Diagnosis not present

## 2019-09-04 ENCOUNTER — Telehealth: Payer: Self-pay | Admitting: Licensed Clinical Social Worker

## 2019-09-04 DIAGNOSIS — M25511 Pain in right shoulder: Secondary | ICD-10-CM | POA: Diagnosis not present

## 2019-09-04 NOTE — Telephone Encounter (Signed)
   Unsuccessful Phone Outreach Note  09/04/2019 Name: [name] [name] MRN: [id number] DOB: 1966-09-06  Referred by: McDiarmid, Blane Ohara, MD,  Reason for referral : Care Coordination (F/U call) ;Marland Kitchen  [name] [name] is a 53 y.o. year old female who sees McDiarmid, Blane Ohara, MD for primary care.    F/U call to patient to see if she was able to connect to therapy resources discussed.  Telephone outreach was unsuccessful. A HIPPA compliant phone message was left for the patient providing contact information and requesting a return call.  Plan: LSCW will wait for return call.  [name], LCSW Clinical Social Worker [place] / [place]   [phone] 12:06 PM

## 2019-09-06 ENCOUNTER — Other Ambulatory Visit: Payer: Self-pay

## 2019-09-06 ENCOUNTER — Encounter: Payer: Self-pay | Admitting: Family Medicine

## 2019-09-06 ENCOUNTER — Ambulatory Visit (INDEPENDENT_AMBULATORY_CARE_PROVIDER_SITE_OTHER): Payer: 59 | Admitting: Family Medicine

## 2019-09-06 VITALS — BP 128/62 | HR 64

## 2019-09-06 DIAGNOSIS — Z23 Encounter for immunization: Secondary | ICD-10-CM

## 2019-09-06 DIAGNOSIS — E118 Type 2 diabetes mellitus with unspecified complications: Secondary | ICD-10-CM | POA: Diagnosis not present

## 2019-09-06 DIAGNOSIS — Z5181 Encounter for therapeutic drug level monitoring: Secondary | ICD-10-CM

## 2019-09-06 DIAGNOSIS — Z0000 Encounter for general adult medical examination without abnormal findings: Secondary | ICD-10-CM

## 2019-09-06 DIAGNOSIS — R1115 Cyclical vomiting syndrome unrelated to migraine: Secondary | ICD-10-CM

## 2019-09-06 DIAGNOSIS — Z79891 Long term (current) use of opiate analgesic: Secondary | ICD-10-CM

## 2019-09-06 MED ORDER — EMPAGLIFLOZIN 10 MG PO TABS: 10.0000 mg | ORAL_TABLET | Freq: Every day | ORAL | 0 refills | Status: DC

## 2019-09-06 MED ORDER — OXYCODONE HCL 5 MG PO TABS: 5.0000 mg | ORAL_TABLET | ORAL | 0 refills | Status: DC | PRN

## 2019-09-06 NOTE — Patient Instructions (Signed)
Start a new diabetes medication called Jardiance. Take one 10 mg tablet in the morning.   Decrease your Lantus to around 15 units daily.  Adjust the Lantus dose based on your blood sugars.   We will call you in two weeks to see how you are doing with this new medication.

## 2019-09-07 ENCOUNTER — Encounter: Payer: Self-pay | Admitting: Family Medicine

## 2019-09-07 ENCOUNTER — Telehealth: Payer: Self-pay | Admitting: Family Medicine

## 2019-09-07 DIAGNOSIS — M25511 Pain in right shoulder: Secondary | ICD-10-CM | POA: Diagnosis not present

## 2019-09-07 MED ORDER — OXYCODONE HCL 5 MG PO TABS: 5.0000 mg | ORAL_TABLET | Freq: Four times a day (QID) | ORAL | 0 refills | Status: DC | PRN

## 2019-09-07 NOTE — Telephone Encounter (Signed)
Please notify patient that prescription/refill was sent into their pharmacy.

## 2019-09-07 NOTE — Assessment & Plan Note (Signed)
Established problem Controlled Continue current abortive therapy regiment of Lorazepam, oxycodone, and phenergan which has been successful in a dramatic reduction in cyclic vomiting episodes requiring ED vists with hospitalizations.  Review of PDMP shows no evidence of aberrant behavior

## 2019-09-07 NOTE — Telephone Encounter (Signed)
Will check with MD to see if he will fill this early for her. Kiaria Quinnell,CMA

## 2019-09-07 NOTE — Progress Notes (Signed)
   Subjective:    Patient ID: [name] [name], female    DOB: March 13, 1966, 53 y.o.   MRN: [id number] LIMA CHILLEMI is alone Sources of clinical information for visit is/are patient and past medical records. Nursing assessment for this office visit was reviewed with the patient for accuracy and revision.   Previous Report(s) Reviewed: historical medical records  Depression screen [facility] 2/9 09/06/2019  Decreased Interest 1  Down, Depressed, Hopeless 1  PHQ - 2 Score 2  Altered sleeping 1  Tired, decreased energy 1  Change in appetite 3  Feeling bad or failure about yourself  1  Trouble concentrating 0  Moving slowly or fidgety/restless 0  Suicidal thoughts 0  PHQ-9 Score 8  Difficult doing work/chores -  Some recent data might be hidden   Fall Risk  11/16/2018 07/28/2018 01/05/2018 12/09/2017 09/28/2017  Falls in the past year? 0 No No No No  Number falls in past yr: 0 - - - -  Injury with Fall? 0 - - - -  Risk for fall due to : - - - - -  Risk for fall due to: Comment - - - - -   Adult vaccines due  Topic Date Due  . TETANUS/TDAP  09/29/2023  . PNEUMOCOCCAL POLYSACCHARIDE VACCINE AGE 58-64 HIGH RISK  Completed   Health Maintenance Due  Topic Date Due  . OPHTHALMOLOGY EXAM  07/08/2017    History/P.E. limitations: none  Adult vaccines due  Topic Date Due  . TETANUS/TDAP  09/29/2023  . PNEUMOCOCCAL POLYSACCHARIDE VACCINE AGE 58-64 HIGH RISK  Completed    Diabetes Health Maintenance Due  Topic Date Due  . OPHTHALMOLOGY EXAM  07/08/2017  . FOOT EXAM  12/29/2019  . HEMOGLOBIN A1C  01/16/2020    Health Maintenance Due  Topic Date Due  . OPHTHALMOLOGY EXAM  07/08/2017     Chief Complaint  Patient presents with  . Diabetes  . Back Pain     HPI  CHRONIC DIABETES  Disease Monitoring  Blood Sugar Ranges: 100s  Polyuria: no   Visual problems: no   Recent Medication Changes:  no   Medication Regiment Adherence: yes   Medication Side Effects  Hypoglycemia: no      Recent Physical Illness: yes, stress for shoulder surgery and PT    Level of Activity: Taking Physical therapy after shoulder surgery  Preventitive Health Care                          Taking Prescribed Statin: no, statin myalgia hx             Taking Prescribed ACEI/ARB: no   Cyclic Vomiting - onset several years ago - no episodes requiring ED visit since last ov with McDiarmd - Does use oxycodone-apap with lorazepam for prodromal symptom (a need to cough sensation). Last rescue use was this morning - used just Lorazepam one tab - Pt feels having the home rescue cocktail has preented several attacks that usually would end her up in the ED with intractable N/V, intractable abdominal pain and dehydration requiring IVF and IV medications    Review of Systems     Objective:   Physical Exam        Assessment & Plan:

## 2019-09-07 NOTE — Assessment & Plan Note (Signed)
[facility] which is overdue

## 2019-09-07 NOTE — Telephone Encounter (Signed)
Patient got a oxycodone 5mg  but says it is not supposed to be filled for 30 more days, she needs it filled now if possible.  It goes to [facility] on [address].  If you have questions, you may call her at [phone].

## 2019-09-07 NOTE — Telephone Encounter (Signed)
Attempted to call patient and inform her that medication was called in but voicemail is full.  She will receive a call from her pharmacy when it is ready to be picked up . Hermina Barnard,CMA

## 2019-09-07 NOTE — Assessment & Plan Note (Addendum)
Established problem Controlled Reduce Lantus from 20 units daily to 15 units daily.  Starting empagliflozin (Jardiance) 10 mg daily.   Requested Pharmacy to follow up with pt by phone in 2-4 weeks to assess effect and adverse effects.   [name] [name] is willing to discuss a trial of moderate to low dose atorvastatin once a week to see if her stomach can tolerate it.  Will likely start once her toleration of Jardiance is assured.

## 2019-09-19 ENCOUNTER — Other Ambulatory Visit: Payer: Self-pay

## 2019-09-19 ENCOUNTER — Emergency Department (HOSPITAL_COMMUNITY): Payer: 59

## 2019-09-19 ENCOUNTER — Inpatient Hospital Stay (HOSPITAL_COMMUNITY)
Admission: EM | Admit: 2019-09-19 | Discharge: 2019-09-23 | DRG: 073 | Disposition: A | Discharge status: Home / Self Care | Payer: 59 | Attending: Family Medicine | Admitting: Family Medicine

## 2019-09-19 ENCOUNTER — Encounter (HOSPITAL_COMMUNITY): Payer: Self-pay

## 2019-09-19 ENCOUNTER — Other Ambulatory Visit (HOSPITAL_COMMUNITY): Payer: Self-pay

## 2019-09-19 ENCOUNTER — Telehealth (INDEPENDENT_AMBULATORY_CARE_PROVIDER_SITE_OTHER): Payer: 59 | Admitting: Family Medicine

## 2019-09-19 DIAGNOSIS — N179 Acute kidney failure, unspecified: Secondary | ICD-10-CM

## 2019-09-19 DIAGNOSIS — I129 Hypertensive chronic kidney disease with stage 1 through stage 4 chronic kidney disease, or unspecified chronic kidney disease: Secondary | ICD-10-CM | POA: Diagnosis present

## 2019-09-19 DIAGNOSIS — Z794 Long term (current) use of insulin: Secondary | ICD-10-CM

## 2019-09-19 DIAGNOSIS — Z961 Presence of intraocular lens: Secondary | ICD-10-CM | POA: Diagnosis present

## 2019-09-19 DIAGNOSIS — R7989 Other specified abnormal findings of blood chemistry: Secondary | ICD-10-CM | POA: Diagnosis not present

## 2019-09-19 DIAGNOSIS — K3184 Gastroparesis: Secondary | ICD-10-CM | POA: Diagnosis present

## 2019-09-19 DIAGNOSIS — K920 Hematemesis: Secondary | ICD-10-CM

## 2019-09-19 DIAGNOSIS — E1143 Type 2 diabetes mellitus with diabetic autonomic (poly)neuropathy: Principal | ICD-10-CM | POA: Diagnosis present

## 2019-09-19 DIAGNOSIS — E860 Dehydration: Secondary | ICD-10-CM | POA: Diagnosis present

## 2019-09-19 DIAGNOSIS — R739 Hyperglycemia, unspecified: Secondary | ICD-10-CM | POA: Diagnosis not present

## 2019-09-19 DIAGNOSIS — Z9049 Acquired absence of other specified parts of digestive tract: Secondary | ICD-10-CM

## 2019-09-19 DIAGNOSIS — N184 Chronic kidney disease, stage 4 (severe): Secondary | ICD-10-CM | POA: Diagnosis present

## 2019-09-19 DIAGNOSIS — R1110 Vomiting, unspecified: Secondary | ICD-10-CM | POA: Diagnosis present

## 2019-09-19 DIAGNOSIS — Z800 Family history of malignant neoplasm of digestive organs: Secondary | ICD-10-CM

## 2019-09-19 DIAGNOSIS — Z8249 Family history of ischemic heart disease and other diseases of the circulatory system: Secondary | ICD-10-CM

## 2019-09-19 DIAGNOSIS — K922 Gastrointestinal hemorrhage, unspecified: Secondary | ICD-10-CM

## 2019-09-19 DIAGNOSIS — E1122 Type 2 diabetes mellitus with diabetic chronic kidney disease: Secondary | ICD-10-CM | POA: Diagnosis present

## 2019-09-19 DIAGNOSIS — N2581 Secondary hyperparathyroidism of renal origin: Secondary | ICD-10-CM | POA: Diagnosis present

## 2019-09-19 DIAGNOSIS — Z20828 Contact with and (suspected) exposure to other viral communicable diseases: Secondary | ICD-10-CM | POA: Diagnosis present

## 2019-09-19 DIAGNOSIS — D631 Anemia in chronic kidney disease: Secondary | ICD-10-CM | POA: Diagnosis present

## 2019-09-19 DIAGNOSIS — Z808 Family history of malignant neoplasm of other organs or systems: Secondary | ICD-10-CM

## 2019-09-19 DIAGNOSIS — Z9841 Cataract extraction status, right eye: Secondary | ICD-10-CM

## 2019-09-19 DIAGNOSIS — Z841 Family history of disorders of kidney and ureter: Secondary | ICD-10-CM

## 2019-09-19 DIAGNOSIS — Z9842 Cataract extraction status, left eye: Secondary | ICD-10-CM

## 2019-09-19 DIAGNOSIS — Z560 Unemployment, unspecified: Secondary | ICD-10-CM

## 2019-09-19 DIAGNOSIS — Z79899 Other long term (current) drug therapy: Secondary | ICD-10-CM

## 2019-09-19 DIAGNOSIS — E1165 Type 2 diabetes mellitus with hyperglycemia: Secondary | ICD-10-CM | POA: Diagnosis present

## 2019-09-19 DIAGNOSIS — Z90710 Acquired absence of both cervix and uterus: Secondary | ICD-10-CM

## 2019-09-19 DIAGNOSIS — K210 Gastro-esophageal reflux disease with esophagitis: Secondary | ICD-10-CM | POA: Diagnosis present

## 2019-09-19 DIAGNOSIS — Z833 Family history of diabetes mellitus: Secondary | ICD-10-CM

## 2019-09-19 DIAGNOSIS — G8929 Other chronic pain: Secondary | ICD-10-CM | POA: Diagnosis present

## 2019-09-19 DIAGNOSIS — K254 Chronic or unspecified gastric ulcer with hemorrhage: Secondary | ICD-10-CM | POA: Diagnosis present

## 2019-09-19 DIAGNOSIS — F17210 Nicotine dependence, cigarettes, uncomplicated: Secondary | ICD-10-CM | POA: Diagnosis present

## 2019-09-19 DIAGNOSIS — E7800 Pure hypercholesterolemia, unspecified: Secondary | ICD-10-CM | POA: Diagnosis present

## 2019-09-19 DIAGNOSIS — R778 Other specified abnormalities of plasma proteins: Secondary | ICD-10-CM

## 2019-09-19 DIAGNOSIS — Z811 Family history of alcohol abuse and dependence: Secondary | ICD-10-CM

## 2019-09-19 LAB — COMPREHENSIVE METABOLIC PANEL
ALT: 19 U/L (ref 0–44)
AST: 26 U/L (ref 15–41)
Albumin: 4.7 g/dL (ref 3.5–5.0)
Alkaline Phosphatase: 112 U/L (ref 38–126)
Anion gap: 19 — ABNORMAL HIGH (ref 5–15)
BUN: 76 mg/dL — ABNORMAL HIGH (ref 6–20)
CO2: 26 mmol/L (ref 22–32)
Calcium: 9.9 mg/dL (ref 8.9–10.3)
Chloride: 85 mmol/L — ABNORMAL LOW (ref 98–111)
Creatinine, Ser: 4.59 mg/dL — ABNORMAL HIGH (ref 0.44–1.00)
GFR calc Af Amer: 12 mL/min — ABNORMAL LOW (ref >60)
GFR calc non Af Amer: 10 mL/min — ABNORMAL LOW (ref >60)
Glucose, Bld: 429 mg/dL — ABNORMAL HIGH (ref 70–99)
Potassium: 4.1 mmol/L (ref 3.5–5.1)
Sodium: 130 mmol/L — ABNORMAL LOW (ref 135–145)
Total Bilirubin: 1.2 mg/dL (ref 0.3–1.2)
Total Protein: 8.8 g/dL — ABNORMAL HIGH (ref 6.5–8.1)

## 2019-09-19 LAB — POCT I-STAT EG7
Acid-Base Excess: 5 mmol/L — ABNORMAL HIGH (ref 0.0–2.0)
Bicarbonate: 25.8 mmol/L (ref 20.0–28.0)
Calcium, Ion: 1.01 mmol/L — ABNORMAL LOW (ref 1.15–1.40)
HCT: 42 % (ref 36.0–46.0)
Hemoglobin: 14.3 g/dL (ref 12.0–15.0)
O2 Saturation: 88 %
Potassium: 3.4 mmol/L — ABNORMAL LOW (ref 3.5–5.1)
Sodium: 133 mmol/L — ABNORMAL LOW (ref 135–145)
TCO2: 27 mmol/L (ref 22–32)
pCO2, Ven: 28.1 mmHg — ABNORMAL LOW (ref 44.0–60.0)
pH, Ven: 7.571 — ABNORMAL HIGH (ref 7.250–7.430)
pO2, Ven: 46 mmHg — ABNORMAL HIGH (ref 32.0–45.0)

## 2019-09-19 LAB — URINALYSIS, ROUTINE W REFLEX MICROSCOPIC
Bacteria, UA: NONE SEEN
Bilirubin Urine: NEGATIVE
Glucose, UA: >=500 mg/dL — AB
Ketones, ur: NEGATIVE mg/dL
Leukocytes,Ua: NEGATIVE
Nitrite: NEGATIVE
Protein, ur: 100 mg/dL — AB
Specific Gravity, Urine: 1.015 (ref 1.005–1.030)
pH: 6 (ref 5.0–8.0)

## 2019-09-19 LAB — CBG MONITORING, ED
Glucose-Capillary: 220 mg/dL — ABNORMAL HIGH (ref 70–99)
Glucose-Capillary: 139 mg/dL — ABNORMAL HIGH (ref 70–99)

## 2019-09-19 LAB — TROPONIN I (HIGH SENSITIVITY)
Troponin I (High Sensitivity): 42 ng/L — ABNORMAL HIGH (ref <18)
Troponin I (High Sensitivity): 31 ng/L — ABNORMAL HIGH (ref <18)

## 2019-09-19 LAB — LIPASE, BLOOD: Lipase: 35 U/L (ref 11–51)

## 2019-09-19 LAB — CBC
HCT: 38.6 % (ref 36.0–46.0)
Hemoglobin: 14.2 g/dL (ref 12.0–15.0)
MCH: 32.6 pg (ref 26.0–34.0)
MCHC: 36.8 g/dL — ABNORMAL HIGH (ref 30.0–36.0)
MCV: 88.7 fL (ref 80.0–100.0)
Platelets: 276 10*3/uL (ref 150–400)
RBC: 4.35 MIL/uL (ref 3.87–5.11)
RDW: 13.1 % (ref 11.5–15.5)
WBC: 15.5 10*3/uL — ABNORMAL HIGH (ref 4.0–10.5)
nRBC: 0 % (ref 0.0–0.2)

## 2019-09-19 LAB — POC OCCULT BLOOD, ED: Fecal Occult Bld: NEGATIVE

## 2019-09-19 MED ORDER — SODIUM CHLORIDE 0.9 % IV BOLUS
1000.0000 mL | Freq: Once | INTRAVENOUS | Status: AC
  Administered 2019-09-19: 1000 mL via INTRAVENOUS

## 2019-09-19 MED ORDER — INSULIN ASPART 100 UNIT/ML ~~LOC~~ SOLN: 10.0000 [IU] | Freq: Once | SUBCUTANEOUS | Status: DC

## 2019-09-19 MED ORDER — INSULIN REGULAR HUMAN 100 UNIT/ML IJ SOLN
0.0000 [IU] | INTRAMUSCULAR | Status: DC
  Administered 2019-09-20: 19:00:00 2 [IU] via SUBCUTANEOUS
  Administered 2019-09-21 (×2): 1 [IU] via SUBCUTANEOUS
  Administered 2019-09-21 (×2): 2 [IU] via SUBCUTANEOUS
  Filled 2019-09-19 (×2): qty 10

## 2019-09-19 MED ORDER — HYDRALAZINE HCL 20 MG/ML IJ SOLN
5.0000 mg | Freq: Once | INTRAMUSCULAR | Status: AC
  Administered 2019-09-19: 5 mg via INTRAVENOUS
  Filled 2019-09-19: qty 1

## 2019-09-19 MED ORDER — SODIUM CHLORIDE 0.9 % IV SOLN
INTRAVENOUS | Status: DC
  Administered 2019-09-19 – 2019-09-22 (×5): via INTRAVENOUS

## 2019-09-19 MED ORDER — PROMETHAZINE HCL 25 MG/ML IJ SOLN
12.5000 mg | Freq: Three times a day (TID) | INTRAMUSCULAR | Status: DC | PRN
  Administered 2019-09-20 – 2019-09-21 (×2): 12.5 mg via INTRAVENOUS
  Filled 2019-09-19 (×3): qty 1

## 2019-09-19 MED ORDER — HYDRALAZINE HCL 20 MG/ML IJ SOLN
5.0000 mg | Freq: Once | INTRAMUSCULAR | Status: AC
  Administered 2019-09-19: 23:00:00 5 mg via INTRAVENOUS
  Filled 2019-09-19: qty 1

## 2019-09-19 MED ORDER — HYDROMORPHONE HCL 1 MG/ML IJ SOLN
1.0000 mg | Freq: Once | INTRAMUSCULAR | Status: AC
  Administered 2019-09-19: 1 mg via INTRAVENOUS
  Filled 2019-09-19: qty 1

## 2019-09-19 MED ORDER — HALOPERIDOL LACTATE 5 MG/ML IJ SOLN
5.0000 mg | Freq: Once | INTRAMUSCULAR | Status: AC
  Administered 2019-09-19: 5 mg via INTRAMUSCULAR
  Filled 2019-09-19: qty 1

## 2019-09-19 MED ORDER — INSULIN ASPART 100 UNIT/ML ~~LOC~~ SOLN
5.0000 [IU] | Freq: Once | SUBCUTANEOUS | Status: AC
  Administered 2019-09-19: 5 [IU] via INTRAVENOUS

## 2019-09-19 MED ORDER — PANTOPRAZOLE SODIUM 40 MG IV SOLR
40.0000 mg | Freq: Once | INTRAVENOUS | Status: AC
  Administered 2019-09-19: 40 mg via INTRAVENOUS
  Filled 2019-09-19: qty 40

## 2019-09-19 MED ORDER — NON FORMULARY: Status: DC

## 2019-09-19 MED ORDER — PANTOPRAZOLE SODIUM 40 MG IV SOLR: 40.0000 mg | Freq: Two times a day (BID) | INTRAVENOUS | Status: DC

## 2019-09-19 MED ORDER — PROMETHAZINE HCL 25 MG/ML IJ SOLN
25.0000 mg | Freq: Once | INTRAMUSCULAR | Status: AC
  Administered 2019-09-19: 25 mg via INTRAVENOUS
  Filled 2019-09-19: qty 1

## 2019-09-19 MED ORDER — SODIUM CHLORIDE 0.9% FLUSH
3.0000 mL | Freq: Once | INTRAVENOUS | Status: AC
  Administered 2019-09-19: 3 mL via INTRAVENOUS

## 2019-09-19 MED ORDER — LORAZEPAM 2 MG/ML IJ SOLN
1.0000 mg | Freq: Once | INTRAMUSCULAR | Status: AC
  Administered 2019-09-19: 1 mg via INTRAVENOUS
  Filled 2019-09-19: qty 1

## 2019-09-19 NOTE — ED Triage Notes (Signed)
Patient complains of 3 days of vomiting. States that she has cyclic vomiting disease and taking her meds but reports cannot keep them down. Patient denies diarrhea. Dry heaves on arrival

## 2019-09-19 NOTE — Progress Notes (Signed)
[place] Telemedicine Visit  Patient consented to have virtual visit. Method of visit: video  Encounter participants: Patient: [name] [name] - located at home Provider: [facility]adalupe Dawn [facility] Others (if applicable): none  Chief Complaint: Vomiting  HPI: 53 year old female who presents as a video visit for 3-day history of intractable vomiting. She states that she has not been able to keep anything down for this time. She states that she started throwing up blood yesterday and has a bedside commode filled about halfway with red material.  Patient states that she has had excruciating abdominal pain starting this morning.  She attempted to take her hydrocodone and Ativan and states that she vomited these medications back up.  ROS: per HPI  Pertinent PMHx: Cyclic vomiting syndrome  Exam:  General: 53 year old African female, in distress, intermittent moaning during exam Respiratory: Able speak in clear coherent sentences Psych: Pleasant female in moderate emotional distress  Assessment/Plan:  Nausea & vomiting Patient with 3-day history of intractable nausea vomiting.  Has approximately a half bedside commode full of blood seen by video visit.  Given her excruciating pain and distress recommended that she be seen in the emergency department.  Patient in agreement with this plan and will call EMS to bring her to [facility] for further evaluation.    Time spent during visit with patient: 7 minutes

## 2019-09-19 NOTE — Addendum Note (Signed)
Addended by: Pauletta Browns on: 09/19/2019 02:01 PM   Modules accepted: Level of Service

## 2019-09-19 NOTE — Assessment & Plan Note (Signed)
Patient with 3-day history of intractable nausea vomiting.  Has approximately a half bedside commode full of blood seen by video visit.  Given her excruciating pain and distress recommended that she be seen in the emergency department.  Patient in agreement with this plan and will call EMS to bring her to [facility] for further evaluation.

## 2019-09-19 NOTE — ED Provider Notes (Signed)
[facility] EMERGENCY DEPARTMENT Provider Note   CSN: [id number] Arrival date & time: 09/19/19  1145    History   Chief Complaint Chief Complaint  Patient presents with  . Emesis    HPI [name] [name] is a 53 y.o. female with past medical history significant for chronic abdominal pain, CKD, cyclical vomiting syndrome, MI who presents for evaluation of emesis.  Patient states she has had 3 days of persistent emesis.  Patient states yesterday she started vomiting dark blood.  She denies any melena or hematochezia.  She has epigastric abdominal pain.  Initially stated did not radiate however upon nursing stated it radiated into her chest.  No prior history of esophageal varices.  He is followed by [facility].  She is tried her home medications without relief of her symptoms.  She was able to take some of her insulin however not all of it.  Denies fever, chills, dysuria, diarrhea, constipation, unilateral weakness, diaphoresis, dizziness.  Does not feel similar to her previous MI.  Denies additional aggravating or alleviating factors.  No abdominal pain or chest pain radiating to back to suggest dissection.  Patient obtained from patient and past medical records.  No interpreter was used.     HPI  Past Medical History:  Diagnosis Date  . Abdominal pain, generalized   . Acquired bilateral hammer toes 01/20/2017  . Acute post-traumatic stress disorder 11/17/2018  . Allergy   . Anemia   . Anxiety   . Arthritis   . At risk for polypharmacy 08/15/2014  . Biceps tendinopathy of right upper extremity 04/05/2014  . Blood transfusion 2003  . Cataract    both eyes  . Chronic generalized abdominal pain   . Chronic generalized abdominal pain   . Chronic kidney disease (CKD), stage III (moderate) (HCC)   . Chronic leg cramping 07/18/2014  . CKD (chronic kidney disease) 08/17/2013   Seen by Dr Joelyn Oms 07/28/15 ([place]) - (see scanned document): 20 years diabetes, 15  years HTN, stable CKD stage III with proteinuria.  - CKD, discussed NSAID avoidance, diabetes control with target A1c<7.5%, and HTN control with SBP<140. F/u 6 months and repeat renal panel at that time. - Microalbuminuria: On lisinopril 40mg , suspected secondary due to diabetic nephropathy. - Chronic back pain: No NSDAIDs. - Say amlodipine causes nausea  - Advised not to ACE-i during vomiting episodes    . Complication of anesthesia    "problems waking up"  . Cyclic vomiting syndrome   . Delayed gastric emptying 05/12/2011    04/12/11 NUCLEAR MEDICINE GASTRIC EMPTYING SCAN Radiopharmaceutical: 2.0 mCi Tc-26m sulfur colloid. Comparison: None. Findings: At 1 hour, 81% of the counts remain in the stomach. At 2 hours, 53% remain. Normal is less than 30% at 2 hours. IMPRESSION: Moderate delay in gastric emptying.      . Depression   . Depression with anxiety 02/23/2007   See [facility] Note dated 05/06/2016.  IC Gwenlyn Saran) was called in and I did a brief assessment.   . Diabetic gastroparesis ([place])    /e-chart  . Diabetic ketoacidosis without coma associated with type 2 diabetes mellitus ([place])   . DKA (diabetic ketoacidoses) ([place]) 08/03/2016  . Dysuria 10/01/2016  . Elevated cholesterol 11/28/2015  . Emotional stress 12/30/2015   Stress after Breakup with partnes of 13 years on 12/27/2015   . Encounter for chronic pain management 01/06/2015   Indication for chronic opioid: Chronic back pain, cyclic vomiting Medication and dose: oxycodone 5mg   #  pills per month: 15 Last UDS date: 05/08/2015 - result pending Pain contract signed (Y/N): Yes, reviewed 01/06/2015  Date narcotic database last reviewed (include red flags): 05/08/2015 (no red flags).    . Erosive esophagitis 08/06/2019  . Esophagitis 04/18/2013   Documented by Dr. Henrene Pastor EGD 03/2011  He also consulted on her during hospitalization 02/2121 for cyclical vomiting. No repeat EGD necessary, treat esophagitis with PPI.    Marland Kitchen Esophagitis determined by  endoscopy 04/18/2013   Documented by Dr. Henrene Pastor EGD 03/2011  He also consulted on her during hospitalization 48/2500 for cyclical vomiting. No repeat EGD necessary, treat esophagitis with PPI.    Marland Kitchen Essential hypertension 05/01/2016  . Essential hypertension, malignant 02/23/2007   Jan 2015 - stopped recently-started norvasc due to reports of GI symptoms. 02/2015 - holding lisinopril, adding hydralazine 10mg  TID, and will take 1/2 tab lisinopril when feeling better when not in cyclic vomiting flare   . Essential hypertension, malignant 02/23/2007   Jan 2015 - stopped recently-started norvasc due to reports of GI symptoms. 02/2015 - holding lisinopril, adding hydralazine 10mg  TID, and will take 1/2 tab lisinopril when feeling better when not in cyclic vomiting flare   . GERD (gastroesophageal reflux disease)   . Heart murmur   . Hematuria 07/09/2015  . High risk social situation 08/31/2012  . Hip pain, bilateral 03/31/2018  . History of colonic polyps 11/15/2018   10/2017 screening colonoscopy Dr Scarlette Shorts: Seven polyps were found in the sigmoid colon, transverse colon, ascending colon and cecum. The polyps were 2 to 10 mm in size. These polyps were removed with a cold snare.   Marland Kitchen Hot flashes 07/29/2011  . Hypertension   . Incontinence 10/03/2017  . INSOMNIA NOS 02/23/2007   Qualifier: Diagnosis of  By: Beryle Lathe    . Intermittent constipation 06/06/2015  . Intestinal impaction ([place])   . Intractable vomiting 07/02/2016  . Marijuana smoker, continuous    pt. denies does smoke marijuana  . MIGRAINE, UNSPEC., W/O INTRACTABLE MIGRAINE 02/23/2007   Qualifier: Diagnosis of  By: Beryle Lathe    . Myocardial infarction [facility]) 07/2007   "they say I've had a silent one; I don't know"  . Nausea and vomiting in adult 06/06/2013  . Neuromuscular disorder (HCC)    neuropathy in both legs  . NEUROPATHY, DIABETIC 02/23/2007  . Nonspecific low back pain 04/10/2007   Back pain is chronic. Oxycodone per prior notes  helps pt be more active. Reportedly uses 4 tab/week oxy for intermittent worsening of chronic back pain but mostly for cyclic vom to keep her out of hospital.    . Normocytic anemia   . Onychomycosis 03/25/2015  . PANCREATITIS 05/09/2008  . Pneumonia   . Pre-ulcerative calluses 05/27/2017  . Protein-calorie malnutrition, severe ([place]) 12/17/2013  . Pure hypercholesterolemia 08/15/2014   Based on her choleserol, BP, smoker status, and that she is a diabetic, her 10 year ASCVD risk is 9.9% putting her in a category of risk that would benefit from high-intensity statin.    . Rotator cuff syndrome 04/19/2014   rt. shoulder  . Smoker   . Stye external 09/02/2017  . TOBACCO DEPENDENCE 02/23/2007   2 cigarettes every few days 11/2012 3 cigarettes daily    . Type II diabetes mellitus ([place])   . Uncontrolled secondary diabetes mellitus with stage 3 CKD (GFR 30-59) (HCC) 10/23/2011   Nephropathy, gastroparesis  HgbA1c (06/2011) 7.6, 9.5, 12.2, 8.2, 10.9 (07/2012), 9.2 (02/11/2013), 7.7 (09/27/13)  Metformin: she has a history  of cyclical vomiting; she would prefer not to take this medication  On lanuts 20U qam with improved control. 09/27/13   . Vitamin D deficiency 11/28/2014    Patient Active Problem List   Diagnosis Date Noted  . Hematemesis 09/19/2019  . History of multiple colonic tubular adenomas 11/15/2018  . Grief reaction 10/16/2018  . Left ventricular diastolic dysfunction with preserved systolic function 44/96/7591  . Rotator cuff tendonopathies with partial tear, right 03/31/2018  . Healthcare maintenance 09/02/2017  . Primary income source is welfare benefits 03/10/2017  . Port-A-Cath in place 01/20/2017  . Secondary hyperparathyroidism of renal origin ([place]) 10/05/2016  . Recurrent major depressive disorder ([place])   . Essential hypertension 05/01/2016  . Diabetic gastroparesis associated with type 2 diabetes mellitus ([place]) 05/01/2016  . DM (diabetes mellitus), type 2 with complications ([place])    . Normocytic anemia   . Intermittent constipation 06/06/2015  . Encounter for monitoring opioid maintenance therapy 01/06/2015  . Vitamin D deficiency 11/28/2014  . Pure hypercholesterolemia 08/15/2014  . Chronic kidney disease (CKD) stage G4/A2, severely decreased glomerular filtration rate (GFR) between 15-29 mL/min/1.73 square meter and albuminuria creatinine ratio between 30-299 mg/g (HCC) 08/17/2013  . Nausea & vomiting 06/06/2013  . Allergic rhinitis, seasonal 05/04/2012  . Marijuana smoker ([place]) 11/03/2011  . Cyclic vomiting syndrome 08/03/2011  . Chronic bilateral low back pain 04/10/2007  . Anxiety disorder 02/23/2007  . Tobacco abuse 02/23/2007  . Chronic insomnia secondary to General Medical and Mood disorders 02/23/2007  . DM neuropathies ([place]) 02/23/2007  . GERD with esophagitis 01/27/2004    Past Surgical History:  Procedure Laterality Date  . ABDOMINAL HYSTERECTOMY  02/2002  . CARDIAC CATHETERIZATION    . CATARACT EXTRACTION W/ INTRAOCULAR LENS  IMPLANT, BILATERAL    . CESAREAN SECTION     x 3  . CHOLECYSTECTOMY  1980's  . COLONOSCOPY     patient not sure  . DILATION AND EVACUATION  X3  . laparotomy and lysis of adhesions    . left hand surgery    . PORT-A-CATH placement  ~ 2008   right chest; "poor access; frequent hospitalizations"  . SHOULDER ARTHROSCOPY Right 07/21/2019   [facility] (Ortho): Arthroscopic rotator cuff repair; Arthroscopic distal clavicle resection; Arthroscopic subacromial decompression and bursectomy;  Debridement of degenerative labral tear  . SMALL INTESTINE SURGERY     laporotomy with lysis of adhesions.  Marland Kitchen UPPER GASTROINTESTINAL ENDOSCOPY    . UPPER GI ENDOSCOPY       OB History    Gravida  7   Para      Term      Preterm      AB  3   Living  4     SAB      TAB  3   Ectopic      Multiple      Live Births               Home Medications    Prior to Admission medications   Medication Sig Start Date End  Date Taking? Authorizing Provider  Insulin Glargine (LANTUS SOLOSTAR) 100 UNIT/ML Solostar Pen Inject 10-14 Units into the skin daily. 08/06/19  Yes McDiarmid, Blane Ohara, MD  oxyCODONE (OXY IR/ROXICODONE) 5 MG immediate release tablet Take 1 tablet (5 mg total) by mouth every 6 (six) hours as needed for severe pain (cyclic vomiting attack). Patient taking differently: Take 5 mg by mouth every 6 (six) hours as needed for severe pain ((cyclic vomiting attack)).  09/07/19  Yes McDiarmid, Blane Ohara, MD  promethazine (PHENERGAN) 12.5 MG tablet Take 1 tablet (12.5 mg total) by mouth every 6 (six) hours as needed for nausea or vomiting. 09/21/18  Yes McDiarmid, Blane Ohara, MD  amLODipine (NORVASC) 5 MG tablet Take 5 mg by mouth at bedtime.  04/11/18   McDiarmid, Blane Ohara, MD  carvedilol (COREG) 6.25 MG tablet TAKE 1 TABLET BY MOUTH TWICE DAILY WITH MEALS Patient taking differently: Take 6.25 mg by mouth 2 (two) times daily with a meal.  01/22/19   McDiarmid, Blane Ohara, MD  cyclobenzaprine (FLEXERIL) 10 MG tablet Take 1 tablet (10 mg total) by mouth 3 (three) times daily as needed for muscle spasms. 07/19/19   Shuford, Olivia Mackie, PA-C  cyclobenzaprine (FLEXERIL) 5 MG tablet Take 1 tablet (5 mg total) by mouth 3 (three) times daily as needed for muscle spasms. 06/07/19   McDiarmid, Blane Ohara, MD  diclofenac sodium (VOLTAREN) 1 % GEL Apply 4 g topically 4 (four) times daily. Apply to shoulder Patient taking differently: Apply 4 g topically 4 (four) times daily as needed (shoulder pain).  05/14/19   McDiarmid, Blane Ohara, MD  empagliflozin (JARDIANCE) 10 MG TABS tablet Take 10 mg by mouth daily. 09/06/19   McDiarmid, Blane Ohara, MD  glucose blood test strip Accu-Chek Aviva strips. Check blood glucose 3 times a day. 03/22/19   McDiarmid, Blane Ohara, MD  Insulin Pen Needle (B-D UF III MINI PEN NEEDLES) 31G X 5 MM MISC Check CBGs three times a day for E11.9 10/27/18   McDiarmid, Blane Ohara, MD  LORazepam (ATIVAN) 1 MG tablet TAKE 1 TABLET BY MOUTH EVERY 6 HOURS  AS NEEDED FOR CYCLIC VOMITING Patient taking differently: Take 1 mg by mouth every 6 (six) hours as needed ("for cyclic vomiting").  08/21/99   McDiarmid, Blane Ohara, MD  lubiprostone (AMITIZA) 24 MCG capsule TAKE 1 CAPSULE(24 MCG) BY MOUTH TWICE DAILY WITH A MEAL Patient taking differently: Take 24 mcg by mouth 2 (two) times daily with a meal.  08/02/19   McDiarmid, Blane Ohara, MD  psyllium (METAMUCIL) 58.6 % powder Take 1 packet by mouth daily.    [provider]    Family History Family History  Problem Relation Age of Onset  . Diabetes type II Mother   . Hypertension Mother   . Heart attack Mother   . Other Mother        Leg amputation  . Kidney disease Mother        Required dialysis 10 years  . Diabetes type II Sister   . Diabetes Sister   . Hypertension Sister   . Alcohol abuse Father   . Diabetes Sister   . Diabetes Sister   . Cancer Sister 1       Brain  . Colon cancer Neg Hx   . Colon polyps Neg Hx   . Esophageal cancer Neg Hx   . Rectal cancer Neg Hx   . Stomach cancer Neg Hx     Social History Social History   Tobacco Use  . Smoking status: Current Some Day Smoker    Packs/day: 0.50    Years: 22.00    Pack years: 11.00    Types: Cigarettes    Start date: 12/27/1982  . Smokeless tobacco: Never Used  Substance Use Topics  . Alcohol use: Yes    Comment: occaisionally; pt reports drinking beer to help her when constipated  . Drug use: Yes    Types: Marijuana  Comment: pt reports she has not "smoked" in 2 wks   Allergies   Bee venom, Tegaderm ag mesh [silver], Acetaminophen, Novolog [insulin aspart], Statins, Lyrica [pregabalin], Tape, Ceftriaxone, and Erythromycin  Review of Systems Review of Systems  Constitutional: Positive for appetite change.  HENT: Negative.   Respiratory: Negative.   Cardiovascular: Positive for chest pain. Negative for palpitations and leg swelling.  Gastrointestinal: Positive for abdominal pain, nausea and vomiting. Negative  for abdominal distention, blood in stool, constipation, diarrhea and rectal pain.  Genitourinary: Negative.   Musculoskeletal: Negative.   Skin: Negative.   Neurological: Negative.   All other systems reviewed and are negative.  Physical Exam Updated Vital Signs BP (!) 209/101   Pulse 98   Temp 99.1 F (37.3 C) (Oral)   Resp 19   SpO2 98%   Physical Exam Vitals signs and nursing note reviewed.  Constitutional:      General: She is not in acute distress.    Appearance: She is not ill-appearing, toxic-appearing or diaphoretic.     Comments: Active emesis, writhing in pain.  HENT:     Head: Normocephalic and atraumatic.     Jaw: There is normal jaw occlusion.     Nose: Nose normal.     Mouth/Throat:     Mouth: Mucous membranes are dry.     Comments: Dry mucous membranes. Neck:     Musculoskeletal: Full passive range of motion without pain, normal range of motion and neck supple.     Vascular: No carotid bruit or JVD.     Trachea: Trachea and phonation normal.  Cardiovascular:     Rate and Rhythm: Tachycardia present.     Pulses: Normal pulses.          Radial pulses are 2+ on the right side and 2+ on the left side.       Posterior tibial pulses are 2+ on the right side and 2+ on the left side.     Heart sounds: Normal heart sounds. No murmur. No friction rub. No gallop.   Pulmonary:     Effort: Pulmonary effort is normal. No respiratory distress.     Breath sounds: Normal breath sounds and air entry. No wheezing or rales.  Abdominal:     General: Bowel sounds are normal.     Comments: Diffuse tenderness to palpation however worse in the epigastric region.  Murphy sign.  There is guarding without rebound.  Musculoskeletal:     Comments: Lower extremity without edema, erythema, ecchymosis or warmth.  No swelling or tenderness.  Moves all 4 extremities without difficulty.  Skin:    Capillary Refill: Capillary refill takes less than 2 seconds.     Comments: Brisk capillary  refill.  Neurological:     Mental Status: She is alert.     Comments: Cranial nerves II through XII grossly intact.  No facial droop.    ED Treatments / Results  Labs (all labs ordered are listed, but only abnormal results are displayed) Labs Reviewed  COMPREHENSIVE METABOLIC PANEL - Abnormal; Notable for the following components:      Result Value   Sodium 130 (*)    Chloride 85 (*)    Glucose, Bld 429 (*)    BUN 76 (*)    Creatinine, Ser 4.59 (*)    Total Protein 8.8 (*)    GFR calc non Af Amer 10 (*)    GFR calc Af Amer 12 (*)    Anion gap 19 (*)  All other components within normal limits  CBC - Abnormal; Notable for the following components:   WBC 15.5 (*)    MCHC 36.8 (*)    All other components within normal limits  URINALYSIS, ROUTINE W REFLEX MICROSCOPIC - Abnormal; Notable for the following components:   Glucose, UA >=500 (*)    Hgb urine dipstick MODERATE (*)    Protein, ur 100 (*)    All other components within normal limits  POCT I-STAT EG7 - Abnormal; Notable for the following components:   pH, Ven 7.571 (*)    pCO2, Ven 28.1 (*)    pO2, Ven 46.0 (*)    Acid-Base Excess 5.0 (*)    Sodium 133 (*)    Potassium 3.4 (*)    Calcium, Ion 1.01 (*)    All other components within normal limits  CBG MONITORING, ED - Abnormal; Notable for the following components:   Glucose-Capillary 220 (*)    All other components within normal limits  CBG MONITORING, ED - Abnormal; Notable for the following components:   Glucose-Capillary 139 (*)    All other components within normal limits  TROPONIN I (HIGH SENSITIVITY) - Abnormal; Notable for the following components:   Troponin I (High Sensitivity) 42 (*)    All other components within normal limits  TROPONIN I (HIGH SENSITIVITY) - Abnormal; Notable for the following components:   Troponin I (High Sensitivity) 31 (*)    All other components within normal limits  SARS CORONAVIRUS 2 (TAT 6-24 HRS)  LIPASE, BLOOD  BASIC  METABOLIC PANEL  CBC  I-STAT VENOUS BLOOD GAS, ED  POC OCCULT BLOOD, ED    EKG None  Radiology Dg Chest 2 View  Result Date: 09/19/2019 CLINICAL DATA:  53 year old female with chest pain. EXAM: CHEST - 2 VIEW COMPARISON:  Chest radiograph dated 08/11/2018 FINDINGS: Right subclavian central venous line with tip over upper SVC again noted. Faint density over the right upper lung field likely represents the injection port of the central venous line and superimposed on the patient. The lungs are clear. There is no pleural effusion or pneumothorax. The cardiac silhouette is within normal limits. No acute osseous pathology. IMPRESSION: 1. No active cardiopulmonary disease. 2. Stable appearance of the right subclavian central venous line. Electronically Signed   By: Anner Crete M.D.   On: 09/19/2019 19:19   Ct Renal Stone Study  Result Date: 09/19/2019 CLINICAL DATA:  Initial evaluation for acute generalized abdominal pain. EXAM: CT ABDOMEN AND PELVIS WITHOUT CONTRAST TECHNIQUE: Multidetector CT imaging of the abdomen and pelvis was performed following the standard protocol without IV contrast. COMPARISON:  Prior CT from 03/01/2019. FINDINGS: Lower chest: Visualized lung bases are clear. Hepatobiliary: Limited noncontrast evaluation liver is unremarkable. Gallbladder surgically absent. No biliary dilatation. Pancreas: Pancreas within normal limits. Spleen: Spleen within normal limits. Adrenals/Urinary Tract: Adrenal glands are normal. Kidneys equal in size without nephrolithiasis or hydronephrosis. No radiopaque calculi seen along the course of either renal collecting system. No hydroureter. Partially distended bladder within normal limits. No layering stones seen within the bladder lumen. Stomach/Bowel: Fluid density seen within the visualized lower esophageal lumen, which could be related to vomiting or reflux. Stomach partially distended without acute finding. No evidence for bowel obstruction.  Normal appendix. No acute inflammatory changes seen about the bowels. Vascular/Lymphatic: Mild to moderate aorto bi-iliac atherosclerotic disease. No aneurysm. No adenopathy. Reproductive: Uterus is absent. Ovaries within normal limits. Other: No free air or fluid. Musculoskeletal: No acute osseous finding. No discrete lytic  or blastic osseous lesions. IMPRESSION: 1. No CT evidence for nephrolithiasis or obstructive uropathy. 2. Intraluminal fluid density within the distal esophageal lumen, which could be related to vomiting and/or reflux disease. 3. No other acute intra-abdominal or pelvic process identified. 4. Status post cholecystectomy and hysterectomy. Electronically Signed   By: Jeannine Boga M.D.   On: 09/19/2019 20:04   Procedures .Critical Care Performed by: Nettie Elm, PA-C Authorized by: Nettie Elm, PA-C   Critical care provider statement:    Critical care time (minutes):  35   Critical care was necessary to treat or prevent imminent or life-threatening deterioration of the following conditions:  Dehydration, endocrine crisis and renal failure (Acute renal failure, Upper GI bleed requring multiple IVF, hyperglycemia with acidosis)   Critical care was time spent personally by me on the following activities:  Discussions with consultants, evaluation of patient's response to treatment, examination of patient, ordering and performing treatments and interventions, ordering and review of laboratory studies, ordering and review of radiographic studies, pulse oximetry, re-evaluation of patient's condition, obtaining history from patient or surrogate and review of old charts   (including critical care time)  Medications Ordered in ED Medications  sodium chloride flush (NS) 0.9 % injection 3 mL (has no administration in time range)  pantoprazole (PROTONIX) injection 40 mg (40 mg Intravenous Not Given 09/19/19 2159)  hydrALAZINE (APRESOLINE) injection 5 mg (has no  administration in time range)  sodium chloride 0.9 % bolus 1,000 mL (0 mLs Intravenous Stopped 09/19/19 2001)  HYDROmorphone (DILAUDID) injection 1 mg (1 mg Intravenous Given 09/19/19 1837)  promethazine (PHENERGAN) injection 25 mg (25 mg Intravenous Given 09/19/19 1824)  LORazepam (ATIVAN) injection 1 mg (1 mg Intravenous Given 09/19/19 1831)  haloperidol lactate (HALDOL) injection 5 mg (5 mg Intramuscular Given 09/19/19 2000)  sodium chloride 0.9 % bolus 1,000 mL (1,000 mLs Intravenous New Bag/Given 09/19/19 2001)  insulin aspart (novoLOG) injection 5 Units (5 Units Intravenous Given 09/19/19 2015)  pantoprazole (PROTONIX) injection 40 mg (40 mg Intravenous Given 09/19/19 2211)   Initial Impression / Assessment and Plan / ED Course  I have reviewed the triage vital signs and the nursing notes.  Pertinent labs & imaging results that were available during my care of the patient were reviewed by me and considered in my medical decision making (see chart for details).  52 year old female presents for evaluation of emesis and abdominal pain here for cyclical vomiting.  She is afebrile, nonseptic, non-ill-appearing.  Active emesis on exam.  Abdomen diffusely tender palpation.  Blood-tinged emesis on exam.  Chest wall tenderness to palpation.  Heart and lungs clear.  No lower extremity edema or warmth.  No evidence of DVT on exam.  Low suspicion for PE.  Labs obtained from triage.  She does have elevated blood sugar and anion gap however negative ketones in urine.  Bicarb normal, pH 7.5, mild hyponatremia at 130, given IV fluids.  Creatinine 4.59 up from previous 2 she does have elevated BUN of 76.  Occult was negative.  Her troponin was elevated at 42.  EKG without ischemic changes.  We will plan for delta Trop.  Urinalysis negative for infection.  Plain film chest without infiltrates, cardiomegaly, pulmonary edema, pneumothorax.  CT stone with luminal fluid in esophagus could likely be from emesis.  Will hold on  insulin drip given repeat glucose 220 without ketones.  EKG no pulling into Epic. No ST/ T changes  Labs personally reviewed.  IVF x 2 . Insulin given. IV  PPI for GI bleed.  2000: Sleeping on reevaluation after Haldol. Arrousable by voice. No current complaints. Pain controlled. CT without acute perforation. No Abd pain radiating into back. Given sx x 3 days low suspicion for ACS, PE, Dissection. Delta trop stable. No EKG changes.  2030: Consulted with Dr. Tammi Klippel with family medicine teaching who agrees to evaluate patient for admission. Requesting GI consult.  2044: Consult with  GI Dr. Ardis Hughs who recommends PPI twice daily. Patient to be n.p.o. after midnight and they will evaluate in the morning.  If patient becomes hemodynamically unstable over the evening he requests to be consulted.  The patient appears reasonably stabilized for admission considering the current resources, flow, and capabilities available in the ED at this time, and I doubt any other [facility] requiring further screening and/or treatment in the ED prior to admission.    Final Clinical Impressions(s) / ED Diagnoses   Final diagnoses:  Upper GI bleed  AKI (acute kidney injury) ([place])  Hyperglycemia  Elevated troponin    ED Discharge Orders    None       ,  A, PA-C 09/19/19 2219    Valarie Merino, MD 09/20/19 2251

## 2019-09-19 NOTE — H&P (Addendum)
[facility] Admission History and Physical Service Pager: [phone]  Patient name: [name] [name] Medical record number: [id number] Date of birth: 08-02-1966 Age: 53 y.o. Gender: female  Primary Care Provider: McDiarmid, Blane Ohara, MD Consultants: GI Code Status: Full Preferred Emergency Contact: Patient did not identify emergency contact   Chief Complaint: hematemsis   Assessment and Plan: [name] [name] is a 53 y.o. female presenting with hematemesis . PMH is significant for diabetic gastroparesis, anxiety disorder, marijuana use, CKD 4, T2DM, HTN  Hematemesis with intractable emesis in setting of chronic diabetic gatroparesis  Patient presenting with intractable vomiting as well as hematemesis x1 day.  Patient has been having emesis x3 days and is unable to keep both solids and liquids down.  Unclear etiology of emesis at this time but per chart review patient has a history of cyclical vomiting and intractable vomiting.  Has been seen by low our GI in the past.  Patient has a history of chronic diabetic gastroparesis which may be contributing.  Long-term problem which is managed by both oxycodone 5 mg and Ativan as abortive therapy.  Hematemesis likely from continued retching from excessive vomiting.  Patient describes coffee-ground emesis without large amounts of blood.  Was unable to quantify the amount but per ED physician was only blood-tinged emesis as she was able to witness this.  Notes from PCP office this morning saying that patient had a commode full of blood.  Patient did not report this to me.  CT abd/pelvis in ED showing no evidence of nephrolithiasis, intraluminal fluid density within distal esophageal lumen (could be related to vomiting and/or reflux), no other intraabdominal or pelvic process identified. Status post 2 L IVF in the ED.  GI was consulted in the ED who recommended keeping patient n.p.o. at midnight for endoscopy in the morning.  Also  recommending starting IV Protonix.  Loading dose of Protonix given to her in the emergency department.  Patient appeared comfortable in room and denied current abdominal pain.  Not currently vomiting as well.  CMP showing low Na, K, and elevated glucose. VBG showing elevated pH, which is expected with increased emesis. will likely improve as emesis resolves. Is very sleepy on exam likely secondary to having received Haldol, Dilaudid, Ativan in the emergency department.  Vital signs showing tachycardia a on presentation but has resolved to heart rate of 97.  Patient is hypertensive rather than hypotensive in the emergency department.  Patient unable to tolerate any p.o. medications but has been taking her insulin regularly. -admit to observation in med tele, attending Dr. Ardelia Mems -IVF @ 100cc/h, s/p 2L NS in ED -IV phenergan -NPO@MN  -GI consulted, appreciate recommendations: NPO@MN , endoscopy in AM, IV protonix 40mg  bid -insert 2 large bore IVs (verbally confirmed with RN) -repeat BMP and CBC @2200  -AM BMP and CBC -vitals q4 -IV protonix 80mg  loading dose, followed by 40mg  bid  Hyperglycemia with h/o T2DM Patient with hyperglycemia to 429 on BMP. Anion Gap elevated to 19, however elevated pH on VGB. Patient had similar findings during previous admission on 08/12/2018. Last A1C 6.5 on 07/16/19. Home meds: lantus 10-14U (would not clarify dose), jardiance 10mg  daily. Will not start insulin gtt at this time but will attempt to manage with short acting insulins and fluid hydration. Did receive home lantus today. Given that patient is NPO will avoid giving lantus in the AM to avoid hypoglycemia.  -will hold lantus while NPO -CBG q4h -sSSI with insulin regular (patient with allergy to aspart)  -  NS @100cc /h, s/p 2L NS in ED  -restart home insulin when tolerating PO, did give herself lantus at home prior to coming to ED   Tachycardia and HTN  Patient with tachycardia to 113 and HTN to 172/102 on  admission. On arrival to room HR was 106 with BP 141/120 (MAP 128). EKG showing ventricular rate of 107 bpm, QTC 477.  Showing sinus tachycardia with nonspecific T wave abnormalities. Likely 2/2 not being able to tolerate PO meds and dehydration. Troponins mildly elevated to 42>31. S/p 2L NS in ED and had some minimal improvement in tachycardia, anticipate this will improve further with IV hydration. In the setting of GI bleed will hold home antihypertensives. However, given that BP is markedly elevated will give 1 time dose of IV hydralazine 5mg  and monitor closely. If continues to be elevated can consider adding on IV BP medications, NPO for endoscopy.  -holding home BP meds in setting of GI bleed and while NPO  continuous cardiac monitoring -AM EKG -admit to telemetry  -continuous cardiac monitoring -AM EKG -IVF as above  -vitals q4h -IV hydral 5mg  x1 dose  -trend trops  Acute on CKD Stage 4 Cr 4.59 on admission, likely elevated in setting of dehydration. BL ~2-2.5.  -s/p 2L NS in ED -NS @100cc /h  Tobacco abuse Smokes about 1 pack q3 days  -encourage cessation -can provide nicotine patch PRN  H/o alcohol use Patient with alcohol use, very difficult to ascertain amount and frequency of use. Also with home Ativan use.  -CIWA protocol without ativan to monitor for withdrawal in setting of both ETOH and ativan use, will add if scores are elevated   R chest port  Patient with implanted port in her right chest.  Unclear why she has this.  She would not go into detail about this.  Progress note from 01/22/2012 seen patient was discharged with central line.  -will ask patient further history questions in the AM when patient is more alert and willing to answer questions  -monitor for signs of infection   FEN/GI: NPO @ MN Prophylaxis: IV protonix, 80mg  loading followed by 40mg  IV bid  Disposition: admit to telemetry-observation, attending Dr. Ardelia Mems  History of Present Illness:  [name] [name] is a 53 y.o. female presenting with hematemesis x1 day. Difficult to obtain HPI as patient did not want to answer several questions.   Patient reports symptoms x3 days. Reports continued emesis and unable to keep solids and liquids down. Reports emesis initially was clear but now dark "like blood". Reports it was black "like coffee grounds". "it was a lot, I don't know I didn't measure it". Denies abdominal pain. Denies diarrhea. Denies blood in stool.   In the ED patient was seen to be tachycardic and hypertensive. Patient received 2L fluid bolus. EKG showing sinus tachycardia. CXR showing wnl. GI consulted and recommended patient for endoscopy in AM (will be NPO@MN ) as well as IV PPI).   Review Of Systems: Per HPI with the following additions:   Review of Systems  Respiratory: Negative for cough, hemoptysis and shortness of breath.   Gastrointestinal: Positive for nausea and vomiting. Negative for abdominal pain, blood in stool, diarrhea and melena.  Genitourinary: Negative for dysuria, flank pain and hematuria.    Patient Active Problem List   Diagnosis Date Noted  . Hematemesis 09/19/2019  . History of multiple colonic tubular adenomas 11/15/2018  . Grief reaction 10/16/2018  . Left ventricular diastolic dysfunction with preserved systolic function 23/53/6144  .  Rotator cuff tendonopathies with partial tear, right 03/31/2018  . Healthcare maintenance 09/02/2017  . Primary income source is welfare benefits 03/10/2017  . Port-A-Cath in place 01/20/2017  . Secondary hyperparathyroidism of renal origin ([place]) 10/05/2016  . Recurrent major depressive disorder ([place])   . Essential hypertension 05/01/2016  . Diabetic gastroparesis associated with type 2 diabetes mellitus ([place]) 05/01/2016  . DM (diabetes mellitus), type 2 with complications ([place])   . Normocytic anemia   . Intermittent constipation 06/06/2015  . Encounter for monitoring opioid maintenance therapy 01/06/2015  .  Vitamin D deficiency 11/28/2014  . Pure hypercholesterolemia 08/15/2014  . Chronic kidney disease (CKD) stage G4/A2, severely decreased glomerular filtration rate (GFR) between 15-29 mL/min/1.73 square meter and albuminuria creatinine ratio between 30-299 mg/g (HCC) 08/17/2013  . Nausea & vomiting 06/06/2013  . Allergic rhinitis, seasonal 05/04/2012  . Marijuana smoker ([place]) 11/03/2011  . Cyclic vomiting syndrome 08/03/2011  . Chronic bilateral low back pain 04/10/2007  . Anxiety disorder 02/23/2007  . Tobacco abuse 02/23/2007  . Chronic insomnia secondary to General Medical and Mood disorders 02/23/2007  . DM neuropathies ([place]) 02/23/2007  . GERD with esophagitis 01/27/2004    Past Medical History: Past Medical History:  Diagnosis Date  . Abdominal pain, generalized   . Acquired bilateral hammer toes 01/20/2017  . Acute post-traumatic stress disorder 11/17/2018  . Allergy   . Anemia   . Anxiety   . Arthritis   . At risk for polypharmacy 08/15/2014  . Biceps tendinopathy of right upper extremity 04/05/2014  . Blood transfusion 2003  . Cataract    both eyes  . Chronic generalized abdominal pain   . Chronic generalized abdominal pain   . Chronic kidney disease (CKD), stage III (moderate) (HCC)   . Chronic leg cramping 07/18/2014  . CKD (chronic kidney disease) 08/17/2013   Seen by Dr Joelyn Oms 07/28/15 ([place]) - (see scanned document): 20 years diabetes, 15 years HTN, stable CKD stage III with proteinuria.  - CKD, discussed NSAID avoidance, diabetes control with target A1c<7.5%, and HTN control with SBP<140. F/u 6 months and repeat renal panel at that time. - Microalbuminuria: On lisinopril 40mg , suspected secondary due to diabetic nephropathy. - Chronic back pain: No NSDAIDs. - Say amlodipine causes nausea  - Advised not to ACE-i during vomiting episodes    . Complication of anesthesia    "problems waking up"  . Cyclic vomiting syndrome   . Delayed gastric emptying  05/12/2011    04/12/11 NUCLEAR MEDICINE GASTRIC EMPTYING SCAN Radiopharmaceutical: 2.0 mCi Tc-3m sulfur colloid. Comparison: None. Findings: At 1 hour, 81% of the counts remain in the stomach. At 2 hours, 53% remain. Normal is less than 30% at 2 hours. IMPRESSION: Moderate delay in gastric emptying.      . Depression   . Depression with anxiety 02/23/2007   See [facility] dated 05/06/2016.  IC Gwenlyn Saran) was called in and I did a brief assessment.   . Diabetic gastroparesis ([place])    /e-chart  . Diabetic ketoacidosis without coma associated with type 2 diabetes mellitus ([place])   . DKA (diabetic ketoacidoses) ([place]) 08/03/2016  . Dysuria 10/01/2016  . Elevated cholesterol 11/28/2015  . Emotional stress 12/30/2015   Stress after Breakup with partnes of 13 years on 12/27/2015   . Encounter for chronic pain management 01/06/2015   Indication for chronic opioid: Chronic back pain, cyclic vomiting Medication and dose: oxycodone 5mg   # pills per month: 15 Last UDS date: 05/08/2015 - result pending Pain  contract signed (Y/N): Yes, reviewed 01/06/2015  Date narcotic database last reviewed (include red flags): 05/08/2015 (no red flags).    . Erosive esophagitis 08/06/2019  . Esophagitis 04/18/2013   Documented by Dr. Henrene Pastor EGD 03/2011  He also consulted on her during hospitalization 27/2536 for cyclical vomiting. No repeat EGD necessary, treat esophagitis with PPI.    Marland Kitchen Esophagitis determined by endoscopy 04/18/2013   Documented by Dr. Henrene Pastor EGD 03/2011  He also consulted on her during hospitalization 64/4034 for cyclical vomiting. No repeat EGD necessary, treat esophagitis with PPI.    Marland Kitchen Essential hypertension 05/01/2016  . Essential hypertension, malignant 02/23/2007   Jan 2015 - stopped recently-started norvasc due to reports of GI symptoms. 02/2015 - holding lisinopril, adding hydralazine 10mg  TID, and will take 1/2 tab lisinopril when feeling better when not in cyclic vomiting flare   . Essential hypertension,  malignant 02/23/2007   Jan 2015 - stopped recently-started norvasc due to reports of GI symptoms. 02/2015 - holding lisinopril, adding hydralazine 10mg  TID, and will take 1/2 tab lisinopril when feeling better when not in cyclic vomiting flare   . GERD (gastroesophageal reflux disease)   . Heart murmur   . Hematuria 07/09/2015  . High risk social situation 08/31/2012  . Hip pain, bilateral 03/31/2018  . History of colonic polyps 11/15/2018   10/2017 screening colonoscopy Dr Scarlette Shorts: Seven polyps were found in the sigmoid colon, transverse colon, ascending colon and cecum. The polyps were 2 to 10 mm in size. These polyps were removed with a cold snare.   Marland Kitchen Hot flashes 07/29/2011  . Hypertension   . Incontinence 10/03/2017  . INSOMNIA NOS 02/23/2007   Qualifier: Diagnosis of  By: Beryle Lathe    . Intermittent constipation 06/06/2015  . Intestinal impaction ([place])   . Intractable vomiting 07/02/2016  . Marijuana smoker, continuous    pt. denies does smoke marijuana  . MIGRAINE, UNSPEC., W/O INTRACTABLE MIGRAINE 02/23/2007   Qualifier: Diagnosis of  By: Beryle Lathe    . Myocardial infarction [facility]) 07/2007   "they say I've had a silent one; I don't know"  . Nausea and vomiting in adult 06/06/2013  . Neuromuscular disorder (HCC)    neuropathy in both legs  . NEUROPATHY, DIABETIC 02/23/2007  . Nonspecific low back pain 04/10/2007   Back pain is chronic. Oxycodone per prior notes helps pt be more active. Reportedly uses 4 tab/week oxy for intermittent worsening of chronic back pain but mostly for cyclic vom to keep her out of hospital.    . Normocytic anemia   . Onychomycosis 03/25/2015  . PANCREATITIS 05/09/2008  . Pneumonia   . Pre-ulcerative calluses 05/27/2017  . Protein-calorie malnutrition, severe ([place]) 12/17/2013  . Pure hypercholesterolemia 08/15/2014   Based on her choleserol, BP, smoker status, and that she is a diabetic, her 10 year ASCVD risk is 9.9% putting her in a category of risk that  would benefit from high-intensity statin.    . Rotator cuff syndrome 04/19/2014   rt. shoulder  . Smoker   . Stye external 09/02/2017  . TOBACCO DEPENDENCE 02/23/2007   2 cigarettes every few days 11/2012 3 cigarettes daily    . Type II diabetes mellitus ([place])   . Uncontrolled secondary diabetes mellitus with stage 3 CKD (GFR 30-59) (HCC) 10/23/2011   Nephropathy, gastroparesis  HgbA1c (06/2011) 7.6, 9.5, 12.2, 8.2, 10.9 (07/2012), 9.2 (02/11/2013), 7.7 (09/27/13)  Metformin: she has a history of cyclical vomiting; she would prefer not to take this medication  On lanuts 20U qam with improved control. 09/27/13   . Vitamin D deficiency 11/28/2014    Past Surgical History: Past Surgical History:  Procedure Laterality Date  . ABDOMINAL HYSTERECTOMY  02/2002  . CARDIAC CATHETERIZATION    . CATARACT EXTRACTION W/ INTRAOCULAR LENS  IMPLANT, BILATERAL    . CESAREAN SECTION     x 3  . CHOLECYSTECTOMY  1980's  . COLONOSCOPY     patient not sure  . DILATION AND EVACUATION  X3  . laparotomy and lysis of adhesions    . left hand surgery    . PORT-A-CATH placement  ~ 2008   right chest; "poor access; frequent hospitalizations"  . SHOULDER ARTHROSCOPY Right 07/21/2019   [facility] (Ortho): Arthroscopic rotator cuff repair; Arthroscopic distal clavicle resection; Arthroscopic subacromial decompression and bursectomy;  Debridement of degenerative labral tear  . SMALL INTESTINE SURGERY     laporotomy with lysis of adhesions.  Marland Kitchen UPPER GASTROINTESTINAL ENDOSCOPY    . UPPER GI ENDOSCOPY      Social History: Social History   Tobacco Use  . Smoking status: Current Some Day Smoker    Packs/day: 0.50    Years: 22.00    Pack years: 11.00    Types: Cigarettes    Start date: 12/27/1982  . Smokeless tobacco: Never Used  Substance Use Topics  . Alcohol use: Yes    Comment: occaisionally; pt reports drinking beer to help her when constipated  . Drug use: Yes    Types: Marijuana    Comment: pt reports  she has not "smoked" in 2 wks   Additional social history: lives alone, drinks a beer "every once in a while", last use 2 weeks ago. Uses marijuana "a dime last me a few days". Last use was last week. Uses 1pack q3days of cigarettes  Please also refer to relevant sections of EMR.  Family History: Family History  Problem Relation Age of Onset  . Diabetes type II Mother   . Hypertension Mother   . Heart attack Mother   . Other Mother        Leg amputation  . Kidney disease Mother        Required dialysis 10 years  . Diabetes type II Sister   . Diabetes Sister   . Hypertension Sister   . Alcohol abuse Father   . Diabetes Sister   . Diabetes Sister   . Cancer Sister 1       Brain  . Colon cancer Neg Hx   . Colon polyps Neg Hx   . Esophageal cancer Neg Hx   . Rectal cancer Neg Hx   . Stomach cancer Neg Hx    Allergies and Medications: Allergies  Allergen Reactions  . Bee Venom Anaphylaxis    Required hospital visit to ER  . Tegaderm Ag Mesh [Silver] Dermatitis    And Sorbaview tape- causes blisters and itching  . Acetaminophen Nausea And Vomiting  . Novolog [Insulin Aspart] Other (See Comments)    Muscles in feet cramp  . Statins Other (See Comments)    GI intolerance (N/V)  . Lyrica [Pregabalin] Other (See Comments)    Leg cramping  . Tape     Blisters  . Ceftriaxone Other (See Comments)    Caused an ulcer in her mouth  . Erythromycin Nausea And Vomiting   No current facility-administered medications on file prior to encounter.    Current Outpatient Medications on File Prior to Encounter  Medication Sig Dispense Refill  .  Insulin Glargine (LANTUS SOLOSTAR) 100 UNIT/ML Solostar Pen Inject 10-14 Units into the skin daily.    Marland Kitchen oxyCODONE (OXY IR/ROXICODONE) 5 MG immediate release tablet Take 1 tablet (5 mg total) by mouth every 6 (six) hours as needed for severe pain (cyclic vomiting attack). (Patient taking differently: Take 5 mg by mouth every 6 (six) hours as needed  for severe pain ((cyclic vomiting attack)). ) 55 tablet 0  . promethazine (PHENERGAN) 12.5 MG tablet Take 1 tablet (12.5 mg total) by mouth every 6 (six) hours as needed for nausea or vomiting. 40 tablet 3  . amLODipine (NORVASC) 5 MG tablet Take 5 mg by mouth at bedtime.  90 tablet 3  . carvedilol (COREG) 6.25 MG tablet TAKE 1 TABLET BY MOUTH TWICE DAILY WITH MEALS (Patient taking differently: Take 6.25 mg by mouth 2 (two) times daily with a meal. ) 180 tablet 1  . cyclobenzaprine (FLEXERIL) 10 MG tablet Take 1 tablet (10 mg total) by mouth 3 (three) times daily as needed for muscle spasms. 30 tablet 1  . cyclobenzaprine (FLEXERIL) 5 MG tablet Take 1 tablet (5 mg total) by mouth 3 (three) times daily as needed for muscle spasms. 30 tablet 1  . diclofenac sodium (VOLTAREN) 1 % GEL Apply 4 g topically 4 (four) times daily. Apply to shoulder (Patient taking differently: Apply 4 g topically 4 (four) times daily as needed (shoulder pain). ) 100 g 5  . empagliflozin (JARDIANCE) 10 MG TABS tablet Take 10 mg by mouth daily. 90 tablet 0  . glucose blood test strip Accu-Chek Aviva strips. Check blood glucose 3 times a day. 100 each 12  . Insulin Pen Needle (B-D UF III MINI PEN NEEDLES) 31G X 5 MM MISC Check CBGs three times a day for E11.9 100 each 3  . LORazepam (ATIVAN) 1 MG tablet TAKE 1 TABLET BY MOUTH EVERY 6 HOURS AS NEEDED FOR CYCLIC VOMITING (Patient taking differently: Take 1 mg by mouth every 6 (six) hours as needed ("for cyclic vomiting"). ) 35 tablet 5  . lubiprostone (AMITIZA) 24 MCG capsule TAKE 1 CAPSULE(24 MCG) BY MOUTH TWICE DAILY WITH A MEAL (Patient taking differently: Take 24 mcg by mouth 2 (two) times daily with a meal. ) 90 capsule 3  . psyllium (METAMUCIL) 58.6 % powder Take 1 packet by mouth daily.      Objective: BP (!) 209/100   Pulse 98   Temp 99 F (37.2 C) (Oral)   Resp 18   SpO2 97%  Exam: General: asleep but wakes easily, NAD, sleeping on her side Eyes: PERRL,  EOMI ENTM: somewhat dry mucous membranes (but had not completed fluid boluses at time of exam)  Neck: supple, no LAD  Cardiovascular: tachycardic, regular rhythm, no MRG  Respiratory: CTAB, no wheezes, rales, or rhonchi  Gastrointestinal: soft, tenderness in epigastric region, bowel sounds present  MSK: no edema, moving extremities spontaneously  Derm: intact, no rashes  Neuro: no focal deficits Psych: normal affect   Labs and Imaging: CBC BMET  Recent Labs  Lab 09/19/19 1215 09/19/19 1821  WBC 15.5*  --   HGB 14.2 14.3  HCT 38.6 42.0  PLT 276  --    Recent Labs  Lab 09/19/19 1215 09/19/19 1821  NA 130* 133*  K 4.1 3.4*  CL 85*  --   CO2 26  --   BUN 76*  --   CREATININE 4.59*  --   GLUCOSE 429*  --   CALCIUM 9.9  --  EKG: EKG showing ventricular rate of 107 bpm, QTC 477.  Showing sinus tachycardia with nonspecific T wave abnormalities.   Ref. Range 09/19/2019 18:21  Sample type Unknown VENOUS  pH, Ven Latest Ref Range: 7.250 - 7.430  7.571 (H)  pCO2, Ven Latest Ref Range: 44.0 - 60.0 mmHg 28.1 (L)  pO2, Ven Latest Ref Range: 32.0 - 45.0 mmHg 46.0 (H)  TCO2 Latest Ref Range: 22 - 32 mmol/L 27  Acid-Base Excess Latest Ref Range: 0.0 - 2.0 mmol/L 5.0 (H)  Bicarbonate Latest Ref Range: 20.0 - 28.0 mmol/L 25.8  O2 Saturation Latest Units: % 88.0  Patient temperature Unknown HIDE    Ref. Range 09/19/2019 17:50 09/19/2019 20:00  Troponin I (High Sensitivity) Latest Ref Range: <18 ng/L 42 (H) 31 (H)    Ref. Range 09/19/2019 19:46  Fecal Occult Blood, POC Latest Ref Range: NEGATIVE  NEGATIVE    Dg Chest 2 View  Result Date: 09/19/2019 CLINICAL DATA:  53 year old female with chest pain. EXAM: CHEST - 2 VIEW COMPARISON:  Chest radiograph dated 08/11/2018 FINDINGS: Right subclavian central venous line with tip over upper SVC again noted. Faint density over the right upper lung field likely represents the injection port of the central venous line and superimposed on the  patient. The lungs are clear. There is no pleural effusion or pneumothorax. The cardiac silhouette is within normal limits. No acute osseous pathology. IMPRESSION: 1. No active cardiopulmonary disease. 2. Stable appearance of the right subclavian central venous line. Electronically Signed   By: Anner Crete M.D.   On: 09/19/2019 19:19   Ct Renal Stone Study  Result Date: 09/19/2019 CLINICAL DATA:  Initial evaluation for acute generalized abdominal pain. EXAM: CT ABDOMEN AND PELVIS WITHOUT CONTRAST TECHNIQUE: Multidetector CT imaging of the abdomen and pelvis was performed following the standard protocol without IV contrast. COMPARISON:  Prior CT from 03/01/2019. FINDINGS: Lower chest: Visualized lung bases are clear. Hepatobiliary: Limited noncontrast evaluation liver is unremarkable. Gallbladder surgically absent. No biliary dilatation. Pancreas: Pancreas within normal limits. Spleen: Spleen within normal limits. Adrenals/Urinary Tract: Adrenal glands are normal. Kidneys equal in size without nephrolithiasis or hydronephrosis. No radiopaque calculi seen along the course of either renal collecting system. No hydroureter. Partially distended bladder within normal limits. No layering stones seen within the bladder lumen. Stomach/Bowel: Fluid density seen within the visualized lower esophageal lumen, which could be related to vomiting or reflux. Stomach partially distended without acute finding. No evidence for bowel obstruction. Normal appendix. No acute inflammatory changes seen about the bowels. Vascular/Lymphatic: Mild to moderate aorto bi-iliac atherosclerotic disease. No aneurysm. No adenopathy. Reproductive: Uterus is absent. Ovaries within normal limits. Other: No free air or fluid. Musculoskeletal: No acute osseous finding. No discrete lytic or blastic osseous lesions. IMPRESSION: 1. No CT evidence for nephrolithiasis or obstructive uropathy. 2. Intraluminal fluid density within the distal esophageal  lumen, which could be related to vomiting and/or reflux disease. 3. No other acute intra-abdominal or pelvic process identified. 4. Status post cholecystectomy and hysterectomy. Electronically Signed   By: Jeannine Boga M.D.   On: 09/19/2019 20:04    Caroline More, DO 09/19/2019, 10:46 PM PGY-3, [place] [facility] pager: [phone], text pages welcome

## 2019-09-19 NOTE — ED Notes (Signed)
Admitting MD notified on pt.'s hypertension .

## 2019-09-20 ENCOUNTER — Encounter (HOSPITAL_COMMUNITY)
Admission: EM | Disposition: A | Discharge status: Home / Self Care | Payer: Self-pay | Source: Home / Self Care | Attending: Family Medicine

## 2019-09-20 ENCOUNTER — Observation Stay (HOSPITAL_COMMUNITY): Payer: 59 | Admitting: Certified Registered Nurse Anesthetist

## 2019-09-20 ENCOUNTER — Encounter (HOSPITAL_COMMUNITY): Payer: Self-pay

## 2019-09-20 DIAGNOSIS — K3184 Gastroparesis: Secondary | ICD-10-CM | POA: Diagnosis present

## 2019-09-20 DIAGNOSIS — R112 Nausea with vomiting, unspecified: Secondary | ICD-10-CM

## 2019-09-20 DIAGNOSIS — F17210 Nicotine dependence, cigarettes, uncomplicated: Secondary | ICD-10-CM | POA: Diagnosis present

## 2019-09-20 DIAGNOSIS — E1122 Type 2 diabetes mellitus with diabetic chronic kidney disease: Secondary | ICD-10-CM | POA: Diagnosis present

## 2019-09-20 DIAGNOSIS — Z811 Family history of alcohol abuse and dependence: Secondary | ICD-10-CM | POA: Diagnosis not present

## 2019-09-20 DIAGNOSIS — N179 Acute kidney failure, unspecified: Secondary | ICD-10-CM | POA: Diagnosis present

## 2019-09-20 DIAGNOSIS — K210 Gastro-esophageal reflux disease with esophagitis: Secondary | ICD-10-CM | POA: Diagnosis present

## 2019-09-20 DIAGNOSIS — K254 Chronic or unspecified gastric ulcer with hemorrhage: Secondary | ICD-10-CM | POA: Diagnosis present

## 2019-09-20 DIAGNOSIS — R1010 Upper abdominal pain, unspecified: Secondary | ICD-10-CM

## 2019-09-20 DIAGNOSIS — Z800 Family history of malignant neoplasm of digestive organs: Secondary | ICD-10-CM | POA: Diagnosis not present

## 2019-09-20 DIAGNOSIS — Z79899 Other long term (current) drug therapy: Secondary | ICD-10-CM | POA: Diagnosis not present

## 2019-09-20 DIAGNOSIS — K920 Hematemesis: Secondary | ICD-10-CM

## 2019-09-20 DIAGNOSIS — E1143 Type 2 diabetes mellitus with diabetic autonomic (poly)neuropathy: Secondary | ICD-10-CM | POA: Diagnosis present

## 2019-09-20 DIAGNOSIS — Z560 Unemployment, unspecified: Secondary | ICD-10-CM | POA: Diagnosis not present

## 2019-09-20 DIAGNOSIS — E7800 Pure hypercholesterolemia, unspecified: Secondary | ICD-10-CM | POA: Diagnosis present

## 2019-09-20 DIAGNOSIS — K922 Gastrointestinal hemorrhage, unspecified: Secondary | ICD-10-CM | POA: Diagnosis present

## 2019-09-20 DIAGNOSIS — Z794 Long term (current) use of insulin: Secondary | ICD-10-CM | POA: Diagnosis not present

## 2019-09-20 DIAGNOSIS — Z20828 Contact with and (suspected) exposure to other viral communicable diseases: Secondary | ICD-10-CM | POA: Diagnosis present

## 2019-09-20 DIAGNOSIS — Z8249 Family history of ischemic heart disease and other diseases of the circulatory system: Secondary | ICD-10-CM | POA: Diagnosis not present

## 2019-09-20 DIAGNOSIS — I129 Hypertensive chronic kidney disease with stage 1 through stage 4 chronic kidney disease, or unspecified chronic kidney disease: Secondary | ICD-10-CM | POA: Diagnosis present

## 2019-09-20 DIAGNOSIS — N2581 Secondary hyperparathyroidism of renal origin: Secondary | ICD-10-CM | POA: Diagnosis present

## 2019-09-20 DIAGNOSIS — G8929 Other chronic pain: Secondary | ICD-10-CM | POA: Diagnosis present

## 2019-09-20 DIAGNOSIS — Z9049 Acquired absence of other specified parts of digestive tract: Secondary | ICD-10-CM | POA: Diagnosis not present

## 2019-09-20 DIAGNOSIS — E860 Dehydration: Secondary | ICD-10-CM | POA: Diagnosis present

## 2019-09-20 DIAGNOSIS — R1110 Vomiting, unspecified: Secondary | ICD-10-CM | POA: Diagnosis present

## 2019-09-20 DIAGNOSIS — R778 Other specified abnormalities of plasma proteins: Secondary | ICD-10-CM

## 2019-09-20 DIAGNOSIS — Z90710 Acquired absence of both cervix and uterus: Secondary | ICD-10-CM | POA: Diagnosis not present

## 2019-09-20 DIAGNOSIS — N184 Chronic kidney disease, stage 4 (severe): Secondary | ICD-10-CM | POA: Diagnosis present

## 2019-09-20 DIAGNOSIS — R739 Hyperglycemia, unspecified: Secondary | ICD-10-CM

## 2019-09-20 DIAGNOSIS — Z808 Family history of malignant neoplasm of other organs or systems: Secondary | ICD-10-CM | POA: Diagnosis not present

## 2019-09-20 DIAGNOSIS — D631 Anemia in chronic kidney disease: Secondary | ICD-10-CM | POA: Diagnosis present

## 2019-09-20 LAB — CBC
HCT: 35.9 % — ABNORMAL LOW (ref 36.0–46.0)
HCT: 35.3 % — ABNORMAL LOW (ref 36.0–46.0)
HCT: 21.7 % — ABNORMAL LOW (ref 36.0–46.0)
Hemoglobin: 12.5 g/dL (ref 12.0–15.0)
Hemoglobin: 12.6 g/dL (ref 12.0–15.0)
Hemoglobin: 7.2 g/dL — ABNORMAL LOW (ref 12.0–15.0)
MCH: 31.1 pg (ref 26.0–34.0)
MCH: 32.2 pg (ref 26.0–34.0)
MCH: 32.3 pg (ref 26.0–34.0)
MCHC: 34.8 g/dL (ref 30.0–36.0)
MCHC: 35.7 g/dL (ref 30.0–36.0)
MCHC: 33.2 g/dL (ref 30.0–36.0)
MCV: 89.3 fL (ref 80.0–100.0)
MCV: 90.3 fL (ref 80.0–100.0)
MCV: 97.3 fL (ref 80.0–100.0)
Platelets: 253 10*3/uL (ref 150–400)
Platelets: 225 10*3/uL (ref 150–400)
Platelets: 130 10*3/uL — ABNORMAL LOW (ref 150–400)
RBC: 4.02 MIL/uL (ref 3.87–5.11)
RBC: 3.91 MIL/uL (ref 3.87–5.11)
RBC: 2.23 MIL/uL — ABNORMAL LOW (ref 3.87–5.11)
RDW: 13.1 % (ref 11.5–15.5)
RDW: 13.3 % (ref 11.5–15.5)
RDW: 13.6 % (ref 11.5–15.5)
WBC: 17.4 10*3/uL — ABNORMAL HIGH (ref 4.0–10.5)
WBC: 14.5 10*3/uL — ABNORMAL HIGH (ref 4.0–10.5)
WBC: 9.2 10*3/uL (ref 4.0–10.5)
nRBC: 0 % (ref 0.0–0.2)
nRBC: 0 % (ref 0.0–0.2)
nRBC: 0 % (ref 0.0–0.2)

## 2019-09-20 LAB — TROPONIN I (HIGH SENSITIVITY): Troponin I (High Sensitivity): 44 ng/L — ABNORMAL HIGH (ref <18)

## 2019-09-20 LAB — GLUCOSE, CAPILLARY
Glucose-Capillary: 94 mg/dL (ref 70–99)
Glucose-Capillary: 112 mg/dL — ABNORMAL HIGH (ref 70–99)
Glucose-Capillary: 102 mg/dL — ABNORMAL HIGH (ref 70–99)
Glucose-Capillary: 102 mg/dL — ABNORMAL HIGH (ref 70–99)
Glucose-Capillary: 194 mg/dL — ABNORMAL HIGH (ref 70–99)

## 2019-09-20 LAB — COMPREHENSIVE METABOLIC PANEL
ALT: 16 U/L (ref 0–44)
AST: 28 U/L (ref 15–41)
Albumin: 3.7 g/dL (ref 3.5–5.0)
Alkaline Phosphatase: 87 U/L (ref 38–126)
Anion gap: 13 (ref 5–15)
BUN: 64 mg/dL — ABNORMAL HIGH (ref 6–20)
CO2: 20 mmol/L — ABNORMAL LOW (ref 22–32)
Calcium: 9.1 mg/dL (ref 8.9–10.3)
Chloride: 106 mmol/L (ref 98–111)
Creatinine, Ser: 3.25 mg/dL — ABNORMAL HIGH (ref 0.44–1.00)
GFR calc Af Amer: 18 mL/min — ABNORMAL LOW (ref >60)
GFR calc non Af Amer: 15 mL/min — ABNORMAL LOW (ref >60)
Glucose, Bld: 106 mg/dL — ABNORMAL HIGH (ref 70–99)
Potassium: 3.9 mmol/L (ref 3.5–5.1)
Sodium: 139 mmol/L (ref 135–145)
Total Bilirubin: 1 mg/dL (ref 0.3–1.2)
Total Protein: 6.9 g/dL (ref 6.5–8.1)

## 2019-09-20 LAB — BASIC METABOLIC PANEL
Anion gap: 17 — ABNORMAL HIGH (ref 5–15)
BUN: 70 mg/dL — ABNORMAL HIGH (ref 6–20)
CO2: 19 mmol/L — ABNORMAL LOW (ref 22–32)
Calcium: 9 mg/dL (ref 8.9–10.3)
Chloride: 102 mmol/L (ref 98–111)
Creatinine, Ser: 3.66 mg/dL — ABNORMAL HIGH (ref 0.44–1.00)
GFR calc Af Amer: 15 mL/min — ABNORMAL LOW (ref >60)
GFR calc non Af Amer: 13 mL/min — ABNORMAL LOW (ref >60)
Glucose, Bld: 91 mg/dL (ref 70–99)
Potassium: 3.4 mmol/L — ABNORMAL LOW (ref 3.5–5.1)
Sodium: 138 mmol/L (ref 135–145)

## 2019-09-20 LAB — TYPE AND SCREEN
ABO/RH(D): O POS
Antibody Screen: NEGATIVE

## 2019-09-20 LAB — HEMOGLOBIN A1C
Hgb A1c MFr Bld: 7.4 % — ABNORMAL HIGH (ref 4.8–5.6)
Mean Plasma Glucose: 165.68 mg/dL

## 2019-09-20 LAB — HIV ANTIBODY (ROUTINE TESTING W REFLEX): HIV Screen 4th Generation wRfx: NONREACTIVE

## 2019-09-20 LAB — MAGNESIUM: Magnesium: 2.3 mg/dL (ref 1.7–2.4)

## 2019-09-20 LAB — SARS CORONAVIRUS 2 (TAT 6-24 HRS): SARS Coronavirus 2: NEGATIVE

## 2019-09-20 LAB — CBG MONITORING, ED: Glucose-Capillary: 87 mg/dL (ref 70–99)

## 2019-09-20 SURGERY — CANCELLED PROCEDURE

## 2019-09-20 MED ORDER — LABETALOL HCL 5 MG/ML IV SOLN
10.0000 mg | Freq: Once | INTRAVENOUS | Status: AC
  Administered 2019-09-20: 16:00:00 10 mg via INTRAVENOUS
  Filled 2019-09-20: qty 4

## 2019-09-20 MED ORDER — SODIUM CHLORIDE 0.9 % IV SOLN
8.0000 mg/h | INTRAVENOUS | Status: DC
  Filled 2019-09-20: qty 80

## 2019-09-20 MED ORDER — PANTOPRAZOLE SODIUM 40 MG IV SOLR: 40.0000 mg | Freq: Two times a day (BID) | INTRAVENOUS | Status: DC

## 2019-09-20 MED ORDER — HYDROMORPHONE HCL 1 MG/ML IJ SOLN
1.0000 mg | INTRAMUSCULAR | Status: DC | PRN
  Administered 2019-09-20 – 2019-09-21 (×4): 1 mg via INTRAVENOUS
  Filled 2019-09-20 (×4): qty 1

## 2019-09-20 MED ORDER — AMLODIPINE BESYLATE 5 MG PO TABS
5.0000 mg | ORAL_TABLET | Freq: Every day | ORAL | Status: DC
  Administered 2019-09-20 – 2019-09-22 (×3): 5 mg via ORAL
  Filled 2019-09-20 (×3): qty 1

## 2019-09-20 MED ORDER — LORAZEPAM 2 MG/ML IJ SOLN
0.5000 mg | Freq: Four times a day (QID) | INTRAMUSCULAR | Status: DC | PRN
  Administered 2019-09-20 (×2): 0.5 mg via INTRAVENOUS
  Filled 2019-09-20 (×2): qty 1

## 2019-09-20 MED ORDER — CHLORHEXIDINE GLUCONATE CLOTH 2 % EX PADS
6.0000 | MEDICATED_PAD | Freq: Every day | CUTANEOUS | Status: DC
  Administered 2019-09-20 – 2019-09-23 (×4): 6 via TOPICAL

## 2019-09-20 MED ORDER — FENTANYL CITRATE (PF) 100 MCG/2ML IJ SOLN
INTRAMUSCULAR | Status: AC
  Filled 2019-09-20: qty 2

## 2019-09-20 MED ORDER — SODIUM CHLORIDE 0.9% FLUSH
10.0000 mL | INTRAVENOUS | Status: DC | PRN
  Administered 2019-09-22 – 2019-09-23 (×2): 10 mL
  Filled 2019-09-20 (×2): qty 40

## 2019-09-20 MED ORDER — POTASSIUM CHLORIDE 10 MEQ/100ML IV SOLN
10.0000 meq | INTRAVENOUS | Status: AC
  Administered 2019-09-20 (×2): 10 meq via INTRAVENOUS
  Filled 2019-09-20 (×2): qty 100

## 2019-09-20 MED ORDER — PANTOPRAZOLE SODIUM 40 MG IV SOLR
40.0000 mg | Freq: Two times a day (BID) | INTRAVENOUS | Status: DC
  Administered 2019-09-20 – 2019-09-21 (×3): 40 mg via INTRAVENOUS
  Filled 2019-09-20 (×3): qty 40

## 2019-09-20 MED ORDER — CARVEDILOL 6.25 MG PO TABS
6.2500 mg | ORAL_TABLET | Freq: Two times a day (BID) | ORAL | Status: DC
  Administered 2019-09-20 – 2019-09-23 (×6): 6.25 mg via ORAL
  Filled 2019-09-20 (×7): qty 1

## 2019-09-20 MED ORDER — MIDAZOLAM HCL 2 MG/2ML IJ SOLN
INTRAMUSCULAR | Status: AC
  Filled 2019-09-20: qty 2

## 2019-09-20 MED ORDER — LABETALOL HCL 5 MG/ML IV SOLN: 10.0000 mg | INTRAVENOUS | Status: DC | PRN

## 2019-09-20 SURGICAL SUPPLY — 14 items

## 2019-09-20 NOTE — Plan of Care (Signed)

## 2019-09-20 NOTE — Care Management Obs Status (Signed)
[place] NOTIFICATION   Patient Details  Name: KATHERENE DININO MRN: [id number] Date of Birth: 1966-07-18   Medicare Observation Status Notification Given:  Yes    Marilu Favre, RN 09/20/2019, 11:17 AM

## 2019-09-20 NOTE — Progress Notes (Signed)
Family Medicine Teaching Service Daily Progress Note Intern Pager: [phone]  Patient name: [name] [name] Medical record number: [id number] Date of birth: [birth year] Age: 53 y.o. Gender: female  Primary Care Provider: McDiarmid, Blane Ohara, MD Consultants: GI Code Status:   Code Status: Full Code  Pt Overview and Major Events to Date:  09/19/19:  Admitted to medical telemetry  09/20/19:  EGD   Assessment and Plan:  [name] [name] is a 53 y.o. female presenting with hematemesis . PMH is significant for diabetic gastroparesis, anxiety disorder, marijuana use, CKD 4, T2DM, HTN  Hematemesis with intractable emesis in setting of chronic diabetic gatroparesis  Patient continues to have persistent nausea today with associated right upper quadrant abdominal pain.  Had hematemesis yesterday however none since admission.  Hemoglobin 12.6.  Patient states she is unable to keep water and food down. Electrolytes are stable. Has had abdominal pain for the past 5 days.  History of gastroparesis and reports improvement with Ativan milligram and oxycodone 5 mg.  Patient is hungry however is NPO for EGD this afternoon.  -IVF @ 100 mL/hr -IV phenergan, IV Ativan -GI consulted, appreciate recommendations: Endoscopy scheduled for 2 PM this afternoon -IV protonix 40mg  bid - BMP and CBC -Vitals per routine  Hyperglycemia with h/o T2DM Improving. CBGs have been downtrending since admission admission no SSI given as patient has been NPO.  Today A1c 7.4% from 6.5% on 07/16/2019. Home meds: lantus 10 U, jardiance 10mg  daily. -will hold lantus while NPO -CBG TID -sSSI with insulin regular (patient with allergy to aspart)  -restart home insulin when tolerating PO   Tachycardia and HTN  HR ranged 85-126 yesterday and BP 112/74 -209/101.  Patient did not take home  medications as she has been unable to tolerate PO.   - Restart home medication following EGD  - Consider PRN Labetalol if patient not tolerating  PO -holding home BP meds in setting of possible GI bleed and while NPO  -continuous cardiac monitoring -vitals per routine  Acute on CKD Stage 4 Cr 3.25 today 4.59 on admission, likely elevated in setting of dehydration. Baseline ~2-2.5.  -s/p 2L NS in ED -NS @100mL /hr  Tobacco abuse Smokes about 1 pack q3 days  -encourage cessation -can provide nicotine patch PRN  H/o alcohol use Patient with alcohol use, very difficult to ascertain amount and frequency of use. Also with home Ativan use.  -CIWA protocol without ativan to monitor for withdrawal in setting of both ETOH and ativan use, will add if scores are elevated   R chest port  Patient with implanted port in her right chest.    Patient reports he received a central line due to her chronic kidney disease progression. -monitor for signs of infection   FEN/GI: Clears Prophylaxis: IV protonix, 80mg  loading followed by 40mg  IV bid  Disposition: likely home, pending medical stabilization   Subjective:  Patient admitted overnight.  Reports abdominal pain and nausea.  Objective: Temp:  [98.8 F (37.1 C)-100 F (37.8 C)] 100 F (37.8 C) (09/24 0624) Pulse Rate:  [85-126] 108 (09/24 0627) Resp:  [14-22] 16 (09/24 0624) BP: (112-209)/(69-122) 162/88 (09/24 0624) SpO2:  [97 %-100 %] 100 % (09/24 0624) Weight:  [63.5 kg] 63.5 kg (09/24 0624) Physical Exam:  GEN: Uncomfortable appearing, in no acute distress, CV: regular rate and rhythm, no murmurs appreciated  RESP: no increased work of breathing, clear to ascultation bilaterally ABD: r right upper quadrant and epigastric tenderness, guarding, no rebound or palpable masses  MSK: no lower extremity edema SKIN: warm, dry NEURO: grossly normal, moves all extremities appropriately    Laboratory: Recent Labs  Lab 09/19/19 2250 09/20/19 0016 09/20/19 0353  WBC 9.2 17.4* 14.5*  HGB 7.2* 12.5 12.6  HCT 21.7* 35.9* 35.3*  PLT 130* 253 225   Recent Labs  Lab  09/19/19 1215 09/19/19 1821 09/20/19 0016 09/20/19 0353  NA 130* 133* 138 139  K 4.1 3.4* 3.4* 3.9  CL 85*  --  102 106  CO2 26  --  19* 20*  BUN 76*  --  70* 64*  CREATININE 4.59*  --  3.66* 3.25*  CALCIUM 9.9  --  9.0 9.1  PROT 8.8*  --   --  6.9  BILITOT 1.2  --   --  1.0  ALKPHOS 112  --   --  87  ALT 19  --   --  16  AST 26  --   --  28  GLUCOSE 429*  --  91 106*   Mg 2.3      Imaging/Diagnostic Tests: Dg Chest 2 View  Result Date: 09/19/2019 CLINICAL DATA:  53 year old female with chest pain. EXAM: CHEST - 2 VIEW COMPARISON:  Chest radiograph dated 08/11/2018 FINDINGS: Right subclavian central venous line with tip over upper SVC again noted. Faint density over the right upper lung field likely represents the injection port of the central venous line and superimposed on the patient. The lungs are clear. There is no pleural effusion or pneumothorax. The cardiac silhouette is within normal limits. No acute osseous pathology. IMPRESSION: 1. No active cardiopulmonary disease. 2. Stable appearance of the right subclavian central venous line. Electronically Signed   By: Anner Crete M.D.   On: 09/19/2019 19:19   Ct Renal Stone Study  Result Date: 09/19/2019 CLINICAL DATA:  Initial evaluation for acute generalized abdominal pain. EXAM: CT ABDOMEN AND PELVIS WITHOUT CONTRAST TECHNIQUE: Multidetector CT imaging of the abdomen and pelvis was performed following the standard protocol without IV contrast. COMPARISON:  Prior CT from 03/01/2019. FINDINGS: Lower chest: Visualized lung bases are clear. Hepatobiliary: Limited noncontrast evaluation liver is unremarkable. Gallbladder surgically absent. No biliary dilatation. Pancreas: Pancreas within normal limits. Spleen: Spleen within normal limits. Adrenals/Urinary Tract: Adrenal glands are normal. Kidneys equal in size without nephrolithiasis or hydronephrosis. No radiopaque calculi seen along the course of either renal collecting system.  No hydroureter. Partially distended bladder within normal limits. No layering stones seen within the bladder lumen. Stomach/Bowel: Fluid density seen within the visualized lower esophageal lumen, which could be related to vomiting or reflux. Stomach partially distended without acute finding. No evidence for bowel obstruction. Normal appendix. No acute inflammatory changes seen about the bowels. Vascular/Lymphatic: Mild to moderate aorto bi-iliac atherosclerotic disease. No aneurysm. No adenopathy. Reproductive: Uterus is absent. Ovaries within normal limits. Other: No free air or fluid. Musculoskeletal: No acute osseous finding. No discrete lytic or blastic osseous lesions. IMPRESSION: 1. No CT evidence for nephrolithiasis or obstructive uropathy. 2. Intraluminal fluid density within the distal esophageal lumen, which could be related to vomiting and/or reflux disease. 3. No other acute intra-abdominal or pelvic process identified. 4. Status post cholecystectomy and hysterectomy. Electronically Signed   By: Jeannine Boga M.D.   On: 09/19/2019 20:04     Lyndee Hensen, MD 09/20/2019, 11:02 AM PGY-1, [place] Intern pager: [phone], text pages welcome

## 2019-09-20 NOTE — Consult Note (Addendum)
[name] Consult: 8:16 AM 09/20/2019  LOS: 0 days    Referring Provider: Dr Chrisandra [name]  Primary Care Physician:  [name] [name] Primary Gastroenterologist:  [name] [name].        Reason for Consultation: Acute nausea, vomiting and dark, gastric occult blood positive emesis.   HPI: [name] [name] is a 53 y.o. female.  Amongst other problems has hx IDDM.  DKA.  CKD.  Htn.  Port-A-Cath in situ dating back to 2003.   Intermittent to chronic abdominal pain.  GERD.  Diabetic gastroparesis.  Hematemesis.  Cyclic vomiting syndrome and suspected cannabinoid hyperemesis syndrome dating back many years.  In 2010 referred to GI motility specialist at [facility] who felt pt had functional source of abd pain and n/v.  Pancreatitis (do not see details regarding pancreatitis just that her lipase has been elevated into the low 100s several years ago, CTs dating back 18 years without pancreatic pathology).  Constipation treated with Amitiza in the past.. Esophagitis on EGDs 2005, 2008.  SBO.  S/p cholecystectomy, TAH/bil SPO 2003, ex lap LOA, C section.   03/2011 EGD for N/V.  Distal esophagitis, small HH.  Pathology: ulcerative esophagitis. No intestinal metaplasia, eosinophilic esophagitis or viral elements.  No dysplasia, malignancy or glandular/columnar mucosa. 10/2018 colonoscopy.  Screening study.  7 polyps scattered throughout the colon, 2 to 10 mm size.  Otherwise unremarkable. Pathology: tubular adenomas, no HGD.  03/2011 gastric emptying study.  Moderate emptying delay. Head CT for eval of N/V negative 2010.    Home meds include oxycodone, PRN Ativan for chronic diabetic gastroparesis.  Also Amitiza, Metamucil, Phenergan prn.   No PPI or H2 blocker on home meds list.    Despite daily Amitiza, she does not always  have a bowel movement daily.  Endorses chronic abdominal pain.  Pt underwent shoulder surgery in July.  Taking oxycodone for shoulder, abdominal and back pain since then.  She will not divulge how often she has been taking it.  Starting Sunday 9/20, 5 days ago, she developed nausea, vomiting and eventually was unable to keep down her meds but she did continue taking her insulin.  Emesis occurred regardless of attempt to swallow liquids or eat.  Phenergan, Reglan did not help.  Starting Tuesday emesis turned dark.  Last bowel movement was brown on Tuesday.  She drove from [facility] to [facility] ED yesterday.  No ASA or NSAIDs.    Blood-tinged emesis, tachycardia, hypertension observed by ED staff. Protonix IV BID initiated.  Also received Haldol, Dilaudid, Ativan in ED.  CT abdomen/pelvis without contrast, renal stone study: Intraluminal fluid density in distal esophagus.  No other acute intra-abdominal/pelvic abnormality.  S/p cholecystectomy, hysterectomy LFTs, lipase normal.  + AKI.  Blood glucose 429.  Hemoglobin A1c 7.4, c/w mean plasma glucose 165. Hgb 7.2 >> 12.6 without transfusion.  Baseline ~ 11.5.  MCV 97. Coags normal GOBT positive, FOBT negative.  High-sensitivity troponin I marginally elevated into the 40s.  U/A bland but glucose > 500 and RBCs 6-10/hpf. THC positive tox screen.  Admits to smoking marijuana though does not divulge how often.  It sounds like if she has pot, she smokes several times a day.  Rarely drinks alcohol.  A sixpack would last her a year.  Lives alone.  Currently unemployed.  Family history negative for colon cancer, colon polyps.    Past Medical History:  Diagnosis Date  . Abdominal pain, generalized   . Acquired bilateral hammer toes 01/20/2017  . Acute post-traumatic stress disorder 11/17/2018  . Allergy   . Anemia   . Anxiety   . Arthritis   . At risk for polypharmacy 08/15/2014  . Biceps tendinopathy of right upper extremity 04/05/2014  .  Blood transfusion 2003  . Cataract    both eyes  . Chronic generalized abdominal pain   . Chronic generalized abdominal pain   . Chronic kidney disease (CKD), stage III (moderate) (HCC)   . Chronic leg cramping 07/18/2014  . CKD (chronic kidney disease) 08/17/2013   Seen by [name] [name] 07/28/15 ([place]) - (see scanned document): 20 years diabetes, 15 years HTN, stable CKD stage III with proteinuria.  - CKD, discussed NSAID avoidance, diabetes control with target A1c<7.5%, and HTN control with SBP<140. F/u 6 months and repeat renal panel at that time. - Microalbuminuria: On lisinopril 40mg , suspected secondary due to diabetic nephropathy. - Chronic back pain: No NSDAIDs. - Say amlodipine causes nausea  - Advised not to ACE-i during vomiting episodes    . Complication of anesthesia    "problems waking up"  . Cyclic vomiting syndrome   . Delayed gastric emptying 05/12/2011    04/12/11 NUCLEAR MEDICINE GASTRIC EMPTYING SCAN Radiopharmaceutical: 2.0 mCi Tc-86m sulfur colloid. Comparison: None. Findings: At 1 hour, 81% of the counts remain in the stomach. At 2 hours, 53% remain. Normal is less than 30% at 2 hours. IMPRESSION: Moderate delay in gastric emptying.      . Depression   . Depression with anxiety 02/23/2007   See [facility] Note dated 05/06/2016.  IC Gwenlyn Saran) was called in and I did a brief assessment.   . Diabetic gastroparesis ([place])    /e-chart  . Diabetic ketoacidosis without coma associated with type 2 diabetes mellitus ([place])   . DKA (diabetic ketoacidoses) ([place]) 08/03/2016  . Dysuria 10/01/2016  . Elevated cholesterol 11/28/2015  . Emotional stress 12/30/2015   Stress after Breakup with partnes of 13 years on 12/27/2015   . Encounter for chronic pain management 01/06/2015   Indication for chronic opioid: Chronic back pain, cyclic vomiting Medication and dose: oxycodone 5mg   # pills per month: 15 Last UDS date: 05/08/2015 - result pending Pain contract signed (Y/N): Yes,  reviewed 01/06/2015  Date narcotic database last reviewed (include red flags): 05/08/2015 (no red flags).    . Erosive esophagitis 08/06/2019  . Esophagitis 04/18/2013   Documented by [name] [name] EGD 03/2011  He also consulted on her during hospitalization [id number]/4259 for cyclical vomiting. No repeat EGD necessary, treat esophagitis with PPI.    Marland Kitchen Esophagitis determined by endoscopy 04/18/2013   Documented by [name] [name] EGD 03/2011  He also consulted on her during hospitalization 56/3875 for cyclical vomiting. No repeat EGD necessary, treat esophagitis with PPI.    Marland Kitchen Essential hypertension 05/01/2016  . Essential hypertension, malignant 02/23/2007   Jan 2015 - stopped recently-started norvasc due to reports of GI symptoms. 02/2015 - holding lisinopril, adding hydralazine 10mg  TID, and will take 1/2 tab lisinopril when feeling better when not in cyclic vomiting flare   . Essential  hypertension, malignant 02/23/2007   Jan 2015 - stopped recently-started norvasc due to reports of GI symptoms. 02/2015 - holding lisinopril, adding hydralazine 10mg  TID, and will take 1/2 tab lisinopril when feeling better when not in cyclic vomiting flare   . GERD (gastroesophageal reflux disease)   . Heart murmur   . Hematuria 07/09/2015  . High risk social situation 08/31/2012  . Hip pain, bilateral 03/31/2018  . History of colonic polyps 11/15/2018   10/2017 screening colonoscopy [name] Scarlette Shorts: Seven polyps were found in the sigmoid colon, transverse colon, ascending colon and cecum. The polyps were 2 to 10 mm in size. These polyps were removed with a cold snare.   Marland Kitchen Hot flashes 07/29/2011  . Hypertension   . Incontinence 10/03/2017  . INSOMNIA NOS 02/23/2007   Qualifier: Diagnosis of  By: Beryle Lathe    . Intermittent constipation 06/06/2015  . Intestinal impaction ([place])   . Intractable vomiting 07/02/2016  . Marijuana smoker, continuous    pt. denies does smoke marijuana  . MIGRAINE, UNSPEC., W/O INTRACTABLE MIGRAINE 02/23/2007    Qualifier: Diagnosis of  By: Beryle Lathe    . Myocardial infarction [facility]) 07/2007   "they say I've had a silent one; I don't know"  . Nausea and vomiting in adult 06/06/2013  . Neuromuscular disorder (HCC)    neuropathy in both legs  . NEUROPATHY, DIABETIC 02/23/2007  . Nonspecific low back pain 04/10/2007   Back pain is chronic. Oxycodone per prior notes helps pt be more active. Reportedly uses 4 tab/week oxy for intermittent worsening of chronic back pain but mostly for cyclic vom to keep her out of hospital.    . Normocytic anemia   . Onychomycosis 03/25/2015  . PANCREATITIS 05/09/2008  . Pneumonia   . Pre-ulcerative calluses 05/27/2017  . Protein-calorie malnutrition, severe ([place]) 12/17/2013  . Pure hypercholesterolemia 08/15/2014   Based on her choleserol, BP, smoker status, and that she is a diabetic, her 10 year ASCVD risk is 9.9% putting her in a category of risk that would benefit from high-intensity statin.    . Rotator cuff syndrome 04/19/2014   rt. shoulder  . Smoker   . Stye external 09/02/2017  . TOBACCO DEPENDENCE 02/23/2007   2 cigarettes every few days 11/2012 3 cigarettes daily    . Type II diabetes mellitus ([place])   . Uncontrolled secondary diabetes mellitus with stage 3 CKD (GFR 30-59) (HCC) 10/23/2011   Nephropathy, gastroparesis  HgbA1c (06/2011) 7.6, 9.5, 12.2, 8.2, 10.9 (07/2012), 9.2 (02/11/2013), 7.7 (09/27/13)  Metformin: she has a history of cyclical vomiting; she would prefer not to take this medication  On lanuts 20U qam with improved control. 09/27/13   . Vitamin D deficiency 11/28/2014    Past Surgical History:  Procedure Laterality Date  . ABDOMINAL HYSTERECTOMY  02/2002  . CARDIAC CATHETERIZATION    . CATARACT EXTRACTION W/ INTRAOCULAR LENS  IMPLANT, BILATERAL    . CESAREAN SECTION     x 3  . CHOLECYSTECTOMY  1980's  . COLONOSCOPY     patient not sure  . DILATION AND EVACUATION  X3  . laparotomy and lysis of adhesions    . left hand surgery    .  PORT-A-CATH placement  ~ 2008   right chest; "poor access; frequent hospitalizations"  . SHOULDER ARTHROSCOPY Right 07/21/2019   [facility] (Ortho): Arthroscopic rotator cuff repair; Arthroscopic distal clavicle resection; Arthroscopic subacromial decompression and bursectomy;  Debridement of degenerative labral tear  . SMALL INTESTINE SURGERY  laporotomy with lysis of adhesions.  Marland Kitchen UPPER GASTROINTESTINAL ENDOSCOPY    . UPPER GI ENDOSCOPY      Prior to Admission medications   Medication Sig Start Date End Date Taking? Authorizing Provider  Insulin Glargine (LANTUS SOLOSTAR) 100 UNIT/ML Solostar Pen Inject 10-14 Units into the skin daily. 08/06/19  Yes [name] [name]  oxyCODONE (OXY IR/ROXICODONE) 5 MG immediate release tablet Take 1 tablet (5 mg total) by mouth every 6 (six) hours as needed for severe pain (cyclic vomiting attack). Patient taking differently: Take 5 mg by mouth every 6 (six) hours as needed for severe pain ((cyclic vomiting attack)).  09/07/19  Yes [name] [name]  promethazine (PHENERGAN) 12.5 MG tablet Take 1 tablet (12.5 mg total) by mouth every 6 (six) hours as needed for nausea or vomiting. 09/21/18  Yes [name] [name]  amLODipine (NORVASC) 5 MG tablet Take 5 mg by mouth at bedtime.  04/11/18   [name] [name]  carvedilol (COREG) 6.25 MG tablet TAKE 1 TABLET BY MOUTH TWICE DAILY WITH MEALS Patient taking differently: Take 6.25 mg by mouth 2 (two) times daily with a meal.  01/22/19   [name] [name]  cyclobenzaprine (FLEXERIL) 10 MG tablet Take 1 tablet (10 mg total) by mouth 3 (three) times daily as needed for muscle spasms. 07/19/19   Shuford, Olivia Mackie, PA-C  cyclobenzaprine (FLEXERIL) 5 MG tablet Take 1 tablet (5 mg total) by mouth 3 (three) times daily as needed for muscle spasms. 06/07/19   [name] [name]  diclofenac sodium (VOLTAREN) 1 % GEL Apply 4 g topically 4 (four) times daily. Apply to shoulder Patient taking differently: Apply  4 g topically 4 (four) times daily as needed (shoulder pain).  05/14/19   [name] [name]  empagliflozin (JARDIANCE) 10 MG TABS tablet Take 10 mg by mouth daily. 09/06/19   [name] [name]  glucose blood test strip Accu-Chek Aviva strips. Check blood glucose 3 times a day. 03/22/19   [name] [name]  Insulin Pen Needle (B-D UF III MINI PEN NEEDLES) 31G X 5 MM MISC Check CBGs three times a day for E11.9 10/27/18   [name] [name]  LORazepam (ATIVAN) 1 MG tablet TAKE 1 TABLET BY MOUTH EVERY 6 HOURS AS NEEDED FOR CYCLIC VOMITING Patient taking differently: Take 1 mg by mouth every 6 (six) hours as needed ("for cyclic vomiting").  08/06/90   [name] [name]  lubiprostone (AMITIZA) 24 MCG capsule TAKE 1 CAPSULE(24 MCG) BY MOUTH TWICE DAILY WITH A MEAL Patient taking differently: Take 24 mcg by mouth 2 (two) times daily with a meal.  08/02/19   [name] [name]  psyllium (METAMUCIL) 58.6 % powder Take 1 packet by mouth daily.    Provider, Historical, [name]    Scheduled Meds: . insulin regular  0-9 Units Subcutaneous Q4H  . pantoprazole  40 mg Intravenous Q12H   Infusions: . sodium chloride 100 mL/hr at 09/20/19 0638   PRN Meds: promethazine, sodium chloride flush   Allergies as of 09/19/2019 - Review Complete 09/19/2019  Allergen Reaction Noted  . Bee venom Anaphylaxis 05/31/2013  . Tegaderm ag mesh [silver] Dermatitis 02/06/2015  . Acetaminophen Nausea And Vomiting 01/03/2012  . Novolog [insulin aspart] Other (See Comments) 05/31/2013  . Statins Other (See Comments) 07/21/2017  . Lyrica [pregabalin] Other (See Comments) 03/31/2018  . Tape  09/28/2017  . Ceftriaxone Other (See Comments) 06/21/2016  . Erythromycin Nausea And Vomiting  Family History  Problem Relation Age of Onset  . Diabetes type II Mother   . Hypertension Mother   . Heart attack Mother   . Other Mother        Leg amputation  . Kidney disease Mother        Required dialysis 10 years  .  Diabetes type II Sister   . Diabetes Sister   . Hypertension Sister   . Alcohol abuse Father   . Diabetes Sister   . Diabetes Sister   . Cancer Sister 1       Brain  . Colon cancer Neg Hx   . Colon polyps Neg Hx   . Esophageal cancer Neg Hx   . Rectal cancer Neg Hx   . Stomach cancer Neg Hx     Social History   Socioeconomic History  . Marital status: Single    Spouse name: Not on file  . Number of children: 5  . Years of education: 10  . Highest education level: Not on file  Occupational History  . Occupation: disabled  Social Needs  . Financial resource strain: Not on file  . Food insecurity    Worry: Not on file    Inability: Not on file  . Transportation needs    Medical: Not on file    Non-medical: Not on file  Tobacco Use  . Smoking status: Current Some Day Smoker    Packs/day: 0.50    Years: 22.00    Pack years: 11.00    Types: Cigarettes    Start date: 12/27/1982  . Smokeless tobacco: Never Used  Substance and Sexual Activity  . Alcohol use: Yes    Comment: occaisionally; pt reports drinking beer to help her when constipated  . Drug use: Yes    Types: Marijuana    Comment: pt reports she has not "smoked" in 2 wks  . Sexual activity: Yes    Partners: Male    Birth control/protection: None  Lifestyle  . Physical activity    Days per week: Not on file    Minutes per session: Not on file  . Stress: Not on file  Relationships  . Social Herbalist on phone: Not on file    Gets together: Not on file    Attends religious service: Not on file    Active member of club or organization: Not on file    Attends meetings of clubs or organizations: Not on file    Relationship status: Not on file  . Intimate partner violence    Fear of current or ex partner: Not on file    Emotionally abused: Not on file    Physically abused: Not on file    Forced sexual activity: Not on file  Other Topics Concern  . Not on file  Social History Narrative   Lives  alone in [place], [place]. Daughter and 2 grandchildren nearby   Has had 4 children. Living Children are [place]   Has 10 grandchildren    REVIEW OF SYSTEMS: Constitutional: No weight loss.  Some weakness in recent days. ENT:  No nose bleeds Pulm: No cough, no shortness of breath. CV:  No palpitations, no LE edema.  Burning, nonexertional chest pain. GU:  No hematuria, no frequency GI: See HPI.  Denies dysphagia. Heme: Denies unusual bleeding or bruising. Transfusions:  In 2003, at the time of hysterectomy received PRBCs. Neuro:  No headaches, no peripheral tingling or numbness Derm:  No itching,  no rash or sores.  Endocrine:  No sweats or chills.  No polyuria or dysuria Immunization: Viewed. Travel:  None generally but did drive from [place] to [place] and has been to [place] in recent months.   PHYSICAL EXAM: Vital signs in last 24 hours: Vitals:   09/20/19 0624 09/20/19 0627  BP: (!) 162/88   Pulse: (!) 126 (!) 108  Resp: 16   Temp: 100 F (37.8 C)   SpO2: 100%    Wt Readings from Last 3 Encounters:  09/20/19 63.5 kg  07/19/19 66.5 kg  03/01/19 68.2 kg    General: Hard to get a feel for the patient.  She does not look toxic.  Resists answering questions, annoyed by the fact that she has to repeat answers to questions. Head: No facial asymmetry or swelling.  No signs of head trauma. Eyes: Conjunctiva pink.  No scleral icterus. Ears: Not hard of hearing Nose: No congestion or discharge Mouth: Very few teeth remain.  Tongue midline.  Mucosa moist, pink, clear. Neck: No JVD, no masses. Lungs: Clear bilaterally.  No labored breathing or cough. Heart: RRR.  No MRG.  S1, S2 present Abdomen: Soft, non-distended.  Tender to light palpation throughout but merrily upper abdomen bowel sounds active.  No organomegaly, bruits, hernias.   Rectal: Deferred Musc/Skeltl: No joint redness or swelling. Extremities: No CCE. Neurologic: Oriented x3.  Moves all  4 limbs.  No tremors.  Limb strength not tested. Skin: Not completely surveyed but what was seen there was no sores or rashes or suspicious lesions. Nodes: No cervical adenopathy Psych: Reluctantly cooperative.  Flat affect.  Bothered by staff interaction  Intake/Output from previous day: 09/23 0701 - 09/24 0700 In: 100 [IV Piggyback:100] Out: -  Intake/Output this shift: No intake/output data recorded.  LAB RESULTS: Recent Labs    09/19/19 2250 09/20/19 0016 09/20/19 0353  WBC 9.2 17.4* 14.5*  HGB 7.2* 12.5 12.6  HCT 21.7* 35.9* 35.3*  PLT 130* 253 225   BMET Lab Results  Component Value Date   NA 139 09/20/2019   NA 138 09/20/2019   NA 133 (L) 09/19/2019   K 3.9 09/20/2019   K 3.4 (L) 09/20/2019   K 3.4 (L) 09/19/2019   CL 106 09/20/2019   CL 102 09/20/2019   CL 85 (L) 09/19/2019   CO2 20 (L) 09/20/2019   CO2 19 (L) 09/20/2019   CO2 26 09/19/2019   GLUCOSE 106 (H) 09/20/2019   GLUCOSE 91 09/20/2019   GLUCOSE 429 (H) 09/19/2019   BUN 64 (H) 09/20/2019   BUN 70 (H) 09/20/2019   BUN 76 (H) 09/19/2019   CREATININE 3.25 (H) 09/20/2019   CREATININE 3.66 (H) 09/20/2019   CREATININE 4.59 (H) 09/19/2019   CALCIUM 9.1 09/20/2019   CALCIUM 9.0 09/20/2019   CALCIUM 9.9 09/19/2019   LFT Recent Labs    09/19/19 1215 09/20/19 0353  PROT 8.8* 6.9  ALBUMIN 4.7 3.7  AST 26 28  ALT 19 16  ALKPHOS 112 87  BILITOT 1.2 1.0   PT/INR Lab Results  Component Value Date   INR 1.0 01/24/2009   INR 1.1 08/17/2007   INR 1.0 06/18/2007   Hepatitis Panel No results for input(s): HEPBSAG, HCVAB, HEPAIGM, HEPBIGM in the last 72 hours. C-Diff No components found for: CDIFF Lipase     Component Value Date/Time   LIPASE 35 09/19/2019 1215    Drugs of Abuse     Component Value Date/Time   LABOPIA NONE DETECTED 06/14/2017 0555  COCAINSCRNUR NONE DETECTED 06/14/2017 0555   COCAINSCRNUR NEG 05/08/2015 1117   LABBENZ NONE DETECTED 06/14/2017 0555   LABBENZ NEG  05/08/2015 1117   AMPHETMU NONE DETECTED 06/14/2017 0555   THCU POSITIVE (A) 06/14/2017 0555   LABBARB NONE DETECTED 06/14/2017 0555     RADIOLOGY STUDIES: Dg Chest 2 View  Result Date: 09/19/2019 CLINICAL DATA:  53 year old female with chest pain. EXAM: CHEST - 2 VIEW COMPARISON:  Chest radiograph dated 08/11/2018 FINDINGS: Right subclavian central venous line with tip over upper SVC again noted. Faint density over the right upper lung field likely represents the injection port of the central venous line and superimposed on the patient. The lungs are clear. There is no pleural effusion or pneumothorax. The cardiac silhouette is within normal limits. No acute osseous pathology. IMPRESSION: 1. No active cardiopulmonary disease. 2. Stable appearance of the right subclavian central venous line. Electronically Signed   By: Anner Crete M.D.   On: 09/19/2019 19:19   Ct Renal Stone Study  Result Date: 09/19/2019 CLINICAL DATA:  Initial evaluation for acute generalized abdominal pain. EXAM: CT ABDOMEN AND PELVIS WITHOUT CONTRAST TECHNIQUE: Multidetector CT imaging of the abdomen and pelvis was performed following the standard protocol without IV contrast. COMPARISON:  Prior CT from 03/01/2019. FINDINGS: Lower chest: Visualized lung bases are clear. Hepatobiliary: Limited noncontrast evaluation liver is unremarkable. Gallbladder surgically absent. No biliary dilatation. Pancreas: Pancreas within normal limits. Spleen: Spleen within normal limits. Adrenals/Urinary Tract: Adrenal glands are normal. Kidneys equal in size without nephrolithiasis or hydronephrosis. No radiopaque calculi seen along the course of either renal collecting system. No hydroureter. Partially distended bladder within normal limits. No layering stones seen within the bladder lumen. Stomach/Bowel: Fluid density seen within the visualized lower esophageal lumen, which could be related to vomiting or reflux. Stomach partially distended  without acute finding. No evidence for bowel obstruction. Normal appendix. No acute inflammatory changes seen about the bowels. Vascular/Lymphatic: Mild to moderate aorto bi-iliac atherosclerotic disease. No aneurysm. No adenopathy. Reproductive: Uterus is absent. Ovaries within normal limits. Other: No free air or fluid. Musculoskeletal: No acute osseous finding. No discrete lytic or blastic osseous lesions. IMPRESSION: 1. No CT evidence for nephrolithiasis or obstructive uropathy. 2. Intraluminal fluid density within the distal esophageal lumen, which could be related to vomiting and/or reflux disease. 3. No other acute intra-abdominal or pelvic process identified. 4. Status post cholecystectomy and hysterectomy. Electronically Signed   By: Jeannine Boga M.D.   On: 09/19/2019 20:04     IMPRESSION:   *   Acute spell of nausea, vomiting ultimately developing dark, Gastroccult positive, emesis in pt with hx GERD, cyclic vomiting, marijuana induced hyperemesis, diabetic gastropresis, constipation.    *    AKI.  Baseline stage 3 to 4 CKD.     PLAN:     *    EGD set for 2 PM today.  Patient reluctantly agreeable to undergo procedure.   Azucena Freed  09/20/2019, 8:16 AM Phone [phone]  I have reviewed the entire case in detail with the above APP and discussed the plan in detail.  Therefore, I agree with the diagnoses recorded above. In addition,  I have personally interviewed and examined the patient and have personally reviewed any abdominal/pelvic CT scan images.  My additional thoughts are as follows:  Chronic upper abdominal pain, nausea and vomiting.  Diabetes, cannabis and opioid use. Admitted with exacerbation of this along with coffee-ground emesis, hemoglobin is remained normal.  She was planned for  upper endoscopy this afternoon, but anesthesia canceled the case because the patient's blood pressure was 240/110.  At the time of my evaluation in the endoscopy department, she  is not experiencing chest pain, dyspnea, headache or visual disturbance.  We communicated with the triad hospitalist to expedite treatment of her malignant hypertension as she will be returning to the medical floor shortly.  I suspect she has esophagitis related to chronic nausea and vomiting as a cause for the upper GI bleeding.  She was not on any acid suppression at the time of admission.  There is been no melena and no further hematemesis since admission.  We will put her on a clear liquid diet, check a CBC tomorrow morning and reevaluate her clinically.  If there is no overt bleeding or significant change in hemoglobin, we will decide whether upper endoscopy is necessary right now.  We will also make arrangements for her to follow-up with my partner [name]. Scarlette Shorts, her primary gastroenterologist.   Nelida Meuse III Office:[phone]

## 2019-09-20 NOTE — Progress Notes (Addendum)
FPTS Interim Progress Note  Patient with drastic drop in hemoglobin from 14.3 to 7.2.  Vital signs remained stable as patient was hypertensive with some mild tachycardia.  Vital signs at that time showed a heart rate of 104 and a blood pressure of 176/89.  Discussed with RN who informed me that she had no other episodes of emesis.  Went and saw patient.  Patient was resting comfortably.  Reports continued abdominal pain, unchanged from previous location but does report some more intensity.  Patient is able to speak full sentences and hold conversation.  Patient denies any further emesis.  Denies any stool.  Denies any further blood in emesis or any blood in stool.  I spoke to first lab tech who informed me that he rechecked that same sample.  States that it was confirmed to be a 7.2. I ordered repeat H/H to confirm. I called a second time to the lab as it appears some labs were discontinued.  Second lab tech informed me that she re-reviewed these labs and believes that there was significant dilution.  States that she has ordered to redraw BMP, CBC, troponin.  I have also ordered a type and screen.  Spoke to Dr. Ardis Hughs, gastroenterologist on-call, who went and evaluated patient. Dr. Ardis Hughs agrees with plan to repeat Hemoglobin. If Hgb >10 continue current plan of IV protonix 40mg  BID with scope in AM. If Hgb <10, will need to transfuse 2U PRBCs, consult CCM, order protonix gtt 80mg  @ 8mg /hr, and order coag studies of PT/PTT. Dr. Ardis Hughs would like to be paged with results of repeat Hgb   O: BP 127/75   Pulse (!) 124   Temp 99 F (37.2 C) (Oral)   Resp 14   SpO2 98%     A/P: Anemia 2/2 GI Bleed -repeat Hgb -if Hgb <10 will transfuse 2U PRBCs, consult CCM, order protonix gtt 80mg  @ 8mg /hr, and order coag studies of PT/PTT. Dr. Ardis Hughs would like to be paged with results of repeat Hgb  -if Hgb >10 will continue current plan of IV protonix 40mg  bid with endoscopy in AM  -GI consulted, appreciate  recommendations  -type and screen   Caroline More, DO 09/20/2019, 12:49 AM PGY-3, [place] Medicine Service pager [phone]

## 2019-09-20 NOTE — ED Notes (Signed)
Attempted to call report to the floor. 

## 2019-09-20 NOTE — Anesthesia Preprocedure Evaluation (Deleted)
Anesthesia Evaluation  Patient identified by MRN, date of birth, ID band Patient awake    Reviewed: Allergy & Precautions, NPO status , Patient's Chart, lab work & pertinent test results, reviewed documented beta blocker date and time   History of Anesthesia Complications Negative for: history of anesthetic complications  Airway Mallampati: II  TM Distance: >3 FB Neck ROM: Full    Dental  (+) Dental Advisory Given, Partial Lower, Upper Dentures   Pulmonary Current Smoker and Patient abstained from smoking.,    breath sounds clear to auscultation       Cardiovascular hypertension, Pt. on medications and Pt. on home beta blockers (-) angina+ Past MI   Rhythm:Regular Rate:Tachycardia   '17 TTE - mild concentric LVH. Grade 2 diastolic dysfunction.Trivial MR and TR. LA was moderately dilated.    Neuro/Psych  Headaches, PSYCHIATRIC DISORDERS Anxiety Depression    GI/Hepatic PUD, GERD  Poorly Controlled,(+)     substance abuse  marijuana use,  Cyclic vomiting syndrome Diabetic gastroparesis    Endo/Other  diabetes, Type 2, Insulin Dependent  Renal/GU CRFRenal disease     Musculoskeletal  (+) Arthritis ,   Abdominal   Peds  Hematology  (+) anemia ,   Anesthesia Other Findings   Reproductive/Obstetrics  S/p hysterectomy                           Anesthesia Physical Anesthesia Plan  ASA: III  Anesthesia Plan: General   Post-op Pain Management:    Induction: Intravenous and Rapid sequence  PONV Risk Score and Plan: 3 and Treatment may vary due to age or medical condition, Ondansetron and Dexamethasone  Airway Management Planned: Oral ETT  Additional Equipment: None  Intra-op Plan:   Post-operative Plan: Extubation in OR  Informed Consent: I have reviewed the patients History and Physical, chart, labs and discussed the procedure including the risks, benefits and alternatives for  the proposed anesthesia with the patient or authorized representative who has indicated his/her understanding and acceptance.     Dental advisory given  Plan Discussed with: CRNA and Anesthesiologist  Anesthesia Plan Comments:       Anesthesia Quick Evaluation

## 2019-09-20 NOTE — Progress Notes (Signed)
Endo procedure canceled due to hypertension.  MD Danis spoke with pt and informed her of canceled procedure.  Pt verbalized understanding.  Primary team notified of hypertension.  Orders placed.  Report given to bedside RN.  RN will continue to monitor pt and contact primary team as needed.

## 2019-09-20 NOTE — TOC Initial Note (Signed)
Transition of Care [facility]) - Initial/Assessment Note    Patient Details  Name: [name] [name] MRN: [id number] Date of Birth: [birth year]  [facility] of [facility]) CM/SW Contact:    Marilu Favre, RN Phone Number: 09/20/2019, 11:19 AM  Clinical Narrative:                  Confirmed face sheet information with patient at bedside. She is from home alone , however daughter is close by .   Will continue to follow. Expected Discharge Plan: Home/Self Care Barriers to Discharge: Continued Medical Work up   Patient Goals and CMS Choice Patient states their goals for this hospitalization and ongoing recovery are:: to go home CMS Medicare.gov Compare Post Acute Care list provided to:: Patient    Expected Discharge Plan and Services Expected Discharge Plan: Home/Self Care       Living arrangements for the past 2 months: Single Family Home                 DME Arranged: N/A         HH Arranged: NA          Prior Living Arrangements/Services Living arrangements for the past 2 months: Single Family Home Lives with:: Self Patient language and need for interpreter reviewed:: Yes Do you feel safe going back to the place where you live?: Yes      Need for Family Participation in Patient Care: No (Comment) Care giver support system in place?: Yes (comment)   Criminal Activity/Legal Involvement Pertinent to Current Situation/Hospitalization: No - Comment as needed  Activities of Daily Living      Permission Sought/Granted   Permission granted to share information with : No              Emotional Assessment Appearance:: Appears stated age Attitude/Demeanor/Rapport: Engaged Affect (typically observed): Accepting Orientation: : Oriented to Self, Oriented to Place, Oriented to  Time, Oriented to Situation Alcohol / Substance Use: Not Applicable Psych Involvement: No (comment)  Admission diagnosis:  Upper GI bleed [K92.2] Hyperglycemia [R73.9] Elevated troponin  [R79.89] AKI (acute kidney injury) ([place]) [N17.9] Hematemesis [K92.0] Patient Active Problem List   Diagnosis Date Noted  . Elevated troponin   . Hyperglycemia   . Hematemesis 09/19/2019  . History of multiple colonic tubular adenomas 11/15/2018  . Grief reaction 10/16/2018  . Left ventricular diastolic dysfunction with preserved systolic function 82/99/3716  . Rotator cuff tendonopathies with partial tear, right 03/31/2018  . Healthcare maintenance 09/02/2017  . Primary income source is welfare benefits 03/10/2017  . Port-A-Cath in place 01/20/2017  . Secondary hyperparathyroidism of renal origin ([place]) 10/05/2016  . Recurrent major depressive disorder ([place])   . Essential hypertension 05/01/2016  . Diabetic gastroparesis associated with type 2 diabetes mellitus ([place]) 05/01/2016  . DM (diabetes mellitus), type 2 with complications ([place])   . AKI (acute kidney injury) ([place])   . Normocytic anemia   . Intermittent constipation 06/06/2015  . Encounter for monitoring opioid maintenance therapy 01/06/2015  . Vitamin D deficiency 11/28/2014  . Pure hypercholesterolemia 08/15/2014  . Chronic kidney disease (CKD) stage G4/A2, severely decreased glomerular filtration rate (GFR) between 15-29 mL/min/1.73 square meter and albuminuria creatinine ratio between 30-299 mg/g (H[facility]) 08/17/2013  . Nausea & vomiting 06/06/2013  . Allergic rhinitis, seasonal 05/04/2012  . Marijuana smoker ([place]) 11/03/2011  . Cyclic vomiting syndrome 08/03/2011  . Chronic bilateral low back pain 04/10/2007  . Anxiety disorder 02/23/2007  . Tobacco abuse 02/23/2007  . Chronic insomnia  secondary to General Medical and Mood disorders 02/23/2007  . DM neuropathies ([place]) 02/23/2007  . GERD with esophagitis 01/27/2004   PCP:  McDiarmid, Blane Ohara, MD Pharmacy:   [facility] [address], [place] [address] Phone:  [phone] Fax: [phone]     Social Determinants of Health (SDOH) Interventions    Readmission Risk Interventions No flowsheet data found.

## 2019-09-20 NOTE — Progress Notes (Signed)
FPTS Interim Progress Note  Repeat hemoglobin 12.5.  This appears more consistent with previous CBCs.  Discussed with Dr. Ardis Hughs, gastroenterologist on-call.  Given that hemoglobin is >10 we will continue plan of IV Protonix 40 mg twice daily with plan for endoscopy in a.m.  Continue to keep patient n.p.o. until then.  Will monitor closely.   Caroline More, DO 09/20/2019, 2:26 AM PGY-3, [place] Medicine Service pager [phone]

## 2019-09-21 LAB — COMPREHENSIVE METABOLIC PANEL
ALT: 16 U/L (ref 0–44)
AST: 27 U/L (ref 15–41)
Albumin: 3.5 g/dL (ref 3.5–5.0)
Alkaline Phosphatase: 72 U/L (ref 38–126)
Anion gap: 7 (ref 5–15)
BUN: 43 mg/dL — ABNORMAL HIGH (ref 6–20)
CO2: 24 mmol/L (ref 22–32)
Calcium: 8.9 mg/dL (ref 8.9–10.3)
Chloride: 106 mmol/L (ref 98–111)
Creatinine, Ser: 2.51 mg/dL — ABNORMAL HIGH (ref 0.44–1.00)
GFR calc Af Amer: 24 mL/min — ABNORMAL LOW (ref >60)
GFR calc non Af Amer: 21 mL/min — ABNORMAL LOW (ref >60)
Glucose, Bld: 147 mg/dL — ABNORMAL HIGH (ref 70–99)
Potassium: 3.8 mmol/L (ref 3.5–5.1)
Sodium: 137 mmol/L (ref 135–145)
Total Bilirubin: 0.9 mg/dL (ref 0.3–1.2)
Total Protein: 6.3 g/dL — ABNORMAL LOW (ref 6.5–8.1)

## 2019-09-21 LAB — CBC
HCT: 33.4 % — ABNORMAL LOW (ref 36.0–46.0)
Hemoglobin: 11.1 g/dL — ABNORMAL LOW (ref 12.0–15.0)
MCH: 31.4 pg (ref 26.0–34.0)
MCHC: 33.2 g/dL (ref 30.0–36.0)
MCV: 94.6 fL (ref 80.0–100.0)
Platelets: 196 10*3/uL (ref 150–400)
RBC: 3.53 MIL/uL — ABNORMAL LOW (ref 3.87–5.11)
RDW: 13.7 % (ref 11.5–15.5)
WBC: 7.8 10*3/uL (ref 4.0–10.5)
nRBC: 0 % (ref 0.0–0.2)

## 2019-09-21 LAB — GLUCOSE, CAPILLARY
Glucose-Capillary: 108 mg/dL — ABNORMAL HIGH (ref 70–99)
Glucose-Capillary: 118 mg/dL — ABNORMAL HIGH (ref 70–99)
Glucose-Capillary: 136 mg/dL — ABNORMAL HIGH (ref 70–99)
Glucose-Capillary: 142 mg/dL — ABNORMAL HIGH (ref 70–99)
Glucose-Capillary: 173 mg/dL — ABNORMAL HIGH (ref 70–99)
Glucose-Capillary: 177 mg/dL — ABNORMAL HIGH (ref 70–99)

## 2019-09-21 LAB — MAGNESIUM: Magnesium: 1.9 mg/dL (ref 1.7–2.4)

## 2019-09-21 MED ORDER — MAGIC MOUTHWASH W/LIDOCAINE
5.0000 mL | Freq: Three times a day (TID) | ORAL | Status: DC | PRN
  Administered 2019-09-21: 5 mL via ORAL
  Filled 2019-09-21 (×2): qty 5

## 2019-09-21 MED ORDER — CALCIUM CARBONATE ANTACID 500 MG PO CHEW
1.0000 | CHEWABLE_TABLET | Freq: Three times a day (TID) | ORAL | Status: DC | PRN
  Administered 2019-09-21: 200 mg via ORAL
  Filled 2019-09-21: qty 1

## 2019-09-21 MED ORDER — LABETALOL HCL 5 MG/ML IV SOLN: 10.0000 mg | INTRAVENOUS | Status: DC | PRN

## 2019-09-21 MED ORDER — PANTOPRAZOLE SODIUM 40 MG PO TBEC
40.0000 mg | DELAYED_RELEASE_TABLET | Freq: Two times a day (BID) | ORAL | Status: DC
  Administered 2019-09-21 – 2019-09-23 (×4): 40 mg via ORAL
  Filled 2019-09-21 (×4): qty 1

## 2019-09-21 MED ORDER — HYDROMORPHONE HCL 1 MG/ML IJ SOLN
1.0000 mg | INTRAMUSCULAR | Status: DC | PRN
  Administered 2019-09-21 – 2019-09-22 (×5): 1 mg via INTRAVENOUS
  Filled 2019-09-21 (×5): qty 1

## 2019-09-21 MED ORDER — LUBIPROSTONE 24 MCG PO CAPS
24.0000 ug | ORAL_CAPSULE | Freq: Two times a day (BID) | ORAL | Status: DC
  Administered 2019-09-21 – 2019-09-23 (×4): 24 ug via ORAL
  Filled 2019-09-21 (×6): qty 1

## 2019-09-21 MED ORDER — PSYLLIUM 95 % PO PACK
1.0000 | PACK | Freq: Two times a day (BID) | ORAL | Status: DC
  Administered 2019-09-21 (×2): 1 via ORAL
  Filled 2019-09-21 (×6): qty 1

## 2019-09-21 MED ORDER — HYDROMORPHONE HCL 1 MG/ML IJ SOLN: 0.5000 mg | INTRAMUSCULAR | Status: DC | PRN

## 2019-09-21 MED ORDER — ALUM & MAG HYDROXIDE-SIMETH 200-200-20 MG/5ML PO SUSP
30.0000 mL | Freq: Once | ORAL | Status: AC
  Administered 2019-09-21: 11:00:00 30 mL via ORAL
  Filled 2019-09-21: qty 30

## 2019-09-21 MED ORDER — LIDOCAINE VISCOUS HCL 2 % MT SOLN
15.0000 mL | Freq: Once | OROMUCOSAL | Status: AC
  Administered 2019-09-21: 11:00:00 15 mL via ORAL
  Filled 2019-09-21: qty 15

## 2019-09-21 MED ORDER — ALUM & MAG HYDROXIDE-SIMETH 200-200-20 MG/5ML PO SUSP
30.0000 mL | Freq: Four times a day (QID) | ORAL | Status: DC | PRN
  Administered 2019-09-21: 30 mL via ORAL
  Filled 2019-09-21: qty 30

## 2019-09-21 MED ORDER — INSULIN REGULAR HUMAN 100 UNIT/ML IJ SOLN
0.0000 [IU] | Freq: Three times a day (TID) | INTRAMUSCULAR | Status: DC
  Administered 2019-09-22 (×3): 2 [IU] via SUBCUTANEOUS
  Administered 2019-09-23: 3 [IU] via SUBCUTANEOUS
  Filled 2019-09-21: qty 10

## 2019-09-21 NOTE — Plan of Care (Signed)
  Problem: Education: Goal: Knowledge of General Education information will improve Description: Including pain rating scale, medication(s)/side effects and non-pharmacologic comfort measures Outcome: Progressing   Problem: Health Behavior/Discharge Planning: Goal: Ability to manage health-related needs will improve Outcome: Progressing   Problem: Clinical Measurements: Goal: Ability to maintain clinical measurements within normal limits will improve Outcome: Progressing Goal: Respiratory complications will improve Outcome: Progressing Goal: Cardiovascular complication will be avoided Outcome: Progressing   Problem: Nutrition: Goal: Adequate nutrition will be maintained Outcome: Progressing   Problem: Elimination: Goal: Will not experience complications related to urinary retention Outcome: Progressing   Problem: Pain Managment: Goal: General experience of comfort will improve Outcome: Progressing   Problem: Safety: Goal: Ability to remain free from injury will improve Outcome: Progressing   Problem: Skin Integrity: Goal: Risk for impaired skin integrity will decrease Outcome: Progressing   

## 2019-09-21 NOTE — Progress Notes (Addendum)
Daily Rounding Note  09/21/2019, 8:26 AM  LOS: 1 day   SUBJECTIVE:   Chief complaint: Acute on chronic nausea, vomiting, dark/GOB + emesis.   Nausea improved.  Has not vomited since ED 2 days ago.  Formed, brown stool yesterday.  Epigastric pain, burning quality better but still requiring pain meds and after viscous lidocaine and Mylanta.. Tolerating clear liquids, does not want to advance diet yet until her pain is better.   OBJECTIVE:         Vital signs in last 24 hours:    Temp:  [98.2 F (36.8 C)-98.9 F (37.2 C)] 98.5 F (36.9 C) (09/25 0411) Pulse Rate:  [66-102] 83 (09/25 0411) Resp:  [15-18] 15 (09/25 0411) BP: (156-241)/(79-110) 182/83 (09/25 0411) SpO2:  [100 %] 100 % (09/25 0411) Last BM Date: 09/18/19 Filed Weights   09/20/19 0624  Weight: 63.5 kg   General: Looks better.  In a better mood.  Not ill looking. Heart: RRR. Chest: Clear bilaterally. Abdomen: Soft, not distended.  Tenderness without guarding in upper abdomen.  Active bowel sounds. Extremities: No CCE. Neuro/Psych: Calm, cooperative, not confused.  Moves all limbs without weakness.  Intake/Output from previous day: 09/24 0701 - 09/25 0700 In: 2166.5 [P.O.:1000; I.V.:1166.5] Out: -   Intake/Output this shift: No intake/output data recorded.  Lab Results: Recent Labs    09/20/19 0016 09/20/19 0353 09/21/19 0412  WBC 17.4* 14.5* 7.8  HGB 12.5 12.6 11.1*  HCT 35.9* 35.3* 33.4*  PLT 253 225 196   BMET Recent Labs    09/20/19 0016 09/20/19 0353 09/21/19 0412  NA 138 139 137  K 3.4* 3.9 3.8  CL 102 106 106  CO2 19* 20* 24  GLUCOSE 91 106* 147*  BUN 70* 64* 43*  CREATININE 3.66* 3.25* 2.51*  CALCIUM 9.0 9.1 8.9   LFT Recent Labs    09/19/19 1215 09/20/19 0353 09/21/19 0412  PROT 8.8* 6.9 6.3*  ALBUMIN 4.7 3.7 3.5  AST 26 28 27   ALT 19 16 16   ALKPHOS 112 87 72  BILITOT 1.2 1.0 0.9   PT/INR No results for  input(s): LABPROT, INR in the last 72 hours. Hepatitis Panel No results for input(s): HEPBSAG, HCVAB, HEPAIGM, HEPBIGM in the last 72 hours.  Studies/Results: Dg Chest 2 View  Result Date: 09/19/2019 CLINICAL DATA:  53 year old female with chest pain. EXAM: CHEST - 2 VIEW COMPARISON:  Chest radiograph dated 08/11/2018 FINDINGS: Right subclavian central venous line with tip over upper SVC again noted. Faint density over the right upper lung field likely represents the injection port of the central venous line and superimposed on the patient. The lungs are clear. There is no pleural effusion or pneumothorax. The cardiac silhouette is within normal limits. No acute osseous pathology. IMPRESSION: 1. No active cardiopulmonary disease. 2. Stable appearance of the right subclavian central venous line. Electronically Signed   By: Anner Crete M.D.   On: 09/19/2019 19:19   Ct Renal Stone Study  Result Date: 09/19/2019 CLINICAL DATA:  Initial evaluation for acute generalized abdominal pain. EXAM: CT ABDOMEN AND PELVIS WITHOUT CONTRAST TECHNIQUE: Multidetector CT imaging of the abdomen and pelvis was performed following the standard protocol without IV contrast. COMPARISON:  Prior CT from 03/01/2019. FINDINGS: Lower chest: Visualized lung bases are clear. Hepatobiliary: Limited noncontrast evaluation liver is unremarkable. Gallbladder surgically absent. No biliary dilatation. Pancreas: Pancreas within normal limits. Spleen: Spleen within normal limits. Adrenals/Urinary Tract: Adrenal glands are normal. Kidneys  equal in size without nephrolithiasis or hydronephrosis. No radiopaque calculi seen along the course of either renal collecting system. No hydroureter. Partially distended bladder within normal limits. No layering stones seen within the bladder lumen. Stomach/Bowel: Fluid density seen within the visualized lower esophageal lumen, which could be related to vomiting or reflux. Stomach partially distended  without acute finding. No evidence for bowel obstruction. Normal appendix. No acute inflammatory changes seen about the bowels. Vascular/Lymphatic: Mild to moderate aorto bi-iliac atherosclerotic disease. No aneurysm. No adenopathy. Reproductive: Uterus is absent. Ovaries within normal limits. Other: No free air or fluid. Musculoskeletal: No acute osseous finding. No discrete lytic or blastic osseous lesions. IMPRESSION: 1. No CT evidence for nephrolithiasis or obstructive uropathy. 2. Intraluminal fluid density within the distal esophageal lumen, which could be related to vomiting and/or reflux disease. 3. No other acute intra-abdominal or pelvic process identified. 4. Status post cholecystectomy and hysterectomy. Electronically Signed   By: Jeannine Boga M.D.   On: 09/19/2019 20:04   Scheduled Meds: . amLODipine  5 mg Oral QHS  . carvedilol  6.25 mg Oral BID WC  . Chlorhexidine Gluconate Cloth  6 each Topical Daily  . insulin regular  0-9 Units Subcutaneous Q4H  . pantoprazole  40 mg Intravenous Q12H   Continuous Infusions: . sodium chloride 100 mL/hr at 09/20/19 1759   PRN Meds:.alum & mag hydroxide-simeth, HYDROmorphone (DILAUDID) injection, labetalol, LORazepam, promethazine, sodium chloride flush   ASSESMENT:   *   Acute spell of nausea, vomiting ultimately dark, Gastroccult positive, emesis in pt with hx GERD, cyclic vomiting, marijuana induced hyperemesis, diabetic gastropresis, constipation.  EGD for 9/24 cancelled due to BP 240/110.  Day 2 Protonix 40 IV BID.  No PPI etc PTA.   Patient's symptoms much improved.     *    Htn.  High BP readings led to cancellation of yesterday's EGD.     Carvedilol, amlodipine in place.   BP improved.    *   Minor, normocytic anemia.  Hgb 12.6 >> 11.1  *    AKI.  Improved. Baseline stage 3 to 4 CKD.    *   Chronic MS and abdominal pain.  Dilaudid prn in place.       PLAN   *   Switch to oral Protonix twice daily.  Take PPI BID for 2  weeks, than drop to 1 x daily.  Should stay on PPI life long.    Added back her usual Amitiza and metamucil. GI will sign off.  No need of GI fup w Dr Henrene Pastor, can see prn.      Azucena Freed  09/21/2019, 8:26 AM Phone [phone]  I have discussed the case with the PA, and that is the plan I formulated. I personally interviewed and examined the patient.  No further bleeding since initial CGE, Suspected esophagitis from chronic vomiting. I have elected not to do EGD at this time, as she does not need intervention for the bleeding that has stopped.  Medical plan as outlined above. She will continue to have chronic upper abdominal pain, and thus may be slow to desire diet advancement.  Follow up in clinic with Dr. Henrene Pastor 3-4 weeks. OK for home from GI perspective.  Signing off - call if bleeding recurs.   Nelida Meuse III Office: [phone]

## 2019-09-21 NOTE — Progress Notes (Signed)
Family Medicine Teaching Service Daily Progress Note Intern Pager: [phone]  Patient name: [name] [name] Medical record number: [id number] Date of birth: Aug 21, 1966 Age: 53 y.o. Gender: female  Primary Care Provider: McDiarmid, Blane Ohara, MD Consultants: GI Code Status:   Code Status: Full Code  Pt Overview and Major Events to Date:  09/19/19:  Admitted to medical telemetry  09/20/19:  EGD   Assessment and Plan:  [name] [name] is a 53 y.o. female presenting with hematemesis . PMH is significant for diabetic gastroparesis, anxiety disorder, marijuana use, CKD 4, T2DM, HTN  Hematemesis with intractable emesis in setting of chronic diabetic gatroparesis  Patient reports burning nonradiating epigastric abdominal pain and nausea however states he has not had any emesis. Hemoglobin 11.1 from 12.6.   Electrolytes are stable.  Patient was to have EGD performed yesterday afternoon however was found to be hypertensive with systolic in the 147W.  GI will attempt to perform GI EGD today if patient's blood pressures are stable.  History of gastroparesis and reports improvement with home regimen of Ativan milligram and oxycodone 5 mg.  Patient on a clear liquid diet and will advance after EGD hopefully today. -IVF @ 100 mL/hr -IV phenergan, IV Ativan, IV Dilaudid as needed -GI consulted, appreciate recommendations: -Endoscopy today -IV protonix 40mg  bid - CMP and CBC -Vitals per routine  Hyperglycemia  T2DM Improving.  Fasting CBG 142 this morning and patient received 4 units regular insulin in the previous 24 hours.  Patient is on a clear liquid diet for her EGD.  A1c 7.4% uptrending from 6.5% 2 months ago. Home meds: lantus 10 U, jardiance 10mg  daily. -will hold lantus until diet can be advanced -CBG TID -sSSI with insulin regular (patient with allergy to aspart)  -restart home insulin when tolerating PO   Tachycardia and HTN  Patient was unable to get EGD yesterday as she was hypertensive  systolically 295A.  HR ranged 66-102 and BP 156/80 -241/110.  Patient has not been on home medicine as she has been NPO.  She was given labetalol 10 mg IV and restarted on her home medications.  We will continue to reassess blood pressure for EGD today. - Home medication following EGD  -  PRN Labetalol if patient not tolerating PO -continuous cardiac monitoring -vitals per routine  Acute on CKD Stage 4 Cr 2.51 today from 3.25 on admission, likely elevated in setting of dehydration. Baseline ~2-2.5.  -s/p 2L NS in ED -NS @100mL /hr Lab Results  Component Value Date   CREATININE 2.51 (H) 09/21/2019   CREATININE 3.25 (H) 09/20/2019   CREATININE 3.66 (H) 09/20/2019     Tobacco abuse Smokes about 1 pack q3 days  -encourage cessation -can provide nicotine patch PRN  H/o alcohol use Patient with alcohol use, very difficult to ascertain amount and frequency of use. Also with home Ativan use.  -CIWA protocol without ativan to monitor for withdrawal in setting of both ETOH and ativan use, will add if scores are elevated   R chest port  Patient with implanted port in her right chest.    Patient reports he received a central line due to her chronic kidney disease progression. -monitor for signs of infection   FEN/GI: Clears Prophylaxis: IV protonix, 80mg  loading followed by 40mg  IV bid  Disposition: likely home, pending medical stabilization   Subjective:  Patient reports epigastric abdominal pain and persistent nausea.  Denies vomiting.  Objective: Temp:  [98.2 F (36.8 C)-98.9 F (37.2 C)] 98.5 F (36.9 C) (  09/25 0411) Pulse Rate:  [66-102] 83 (09/25 0411) Resp:  [15-18] 15 (09/25 0411) BP: (156-241)/(79-110) 182/83 (09/25 0411) SpO2:  [100 %] 100 % (09/25 0411) Physical Exam:  GEN: Pleasant, in acute distress  CV: regular rate and rhythm, no murmurs appreciated  RESP: no increased work of breathing, clear to ascultation bilaterally  ABD: Bowel sounds present,  epigastric tenderness soft, nondistended MSK: no lower extremity edema SKIN: warm, dry NEURO: grossly normal, moves all extremities appropriately       Laboratory: Recent Labs  Lab 09/20/19 0016 09/20/19 0353 09/21/19 0412  WBC 17.4* 14.5* 7.8  HGB 12.5 12.6 11.1*  HCT 35.9* 35.3* 33.4*  PLT 253 225 196   Recent Labs  Lab 09/19/19 1215  09/20/19 0016 09/20/19 0353 09/21/19 0412  NA 130*   < > 138 139 137  K 4.1   < > 3.4* 3.9 3.8  CL 85*  --  102 106 106  CO2 26  --  19* 20* 24  BUN 76*  --  70* 64* 43*  CREATININE 4.59*  --  3.66* 3.25* 2.51*  CALCIUM 9.9  --  9.0 9.1 8.9  PROT 8.8*  --   --  6.9 6.3*  BILITOT 1.2  --   --  1.0 0.9  ALKPHOS 112  --   --  87 72  ALT 19  --   --  16 16  AST 26  --   --  28 27  GLUCOSE 429*  --  91 106* 147*   < > = values in this interval not displayed.   Mg 2.3      Imaging/Diagnostic Tests: No results found.   Lyndee Hensen, MD 09/21/2019, 9:43 AM PGY-1, [place] Intern pager: [phone], text pages welcome

## 2019-09-22 LAB — CBC
HCT: 28.2 % — ABNORMAL LOW (ref 36.0–46.0)
HCT: 27.7 % — ABNORMAL LOW (ref 36.0–46.0)
Hemoglobin: 9.7 g/dL — ABNORMAL LOW (ref 12.0–15.0)
Hemoglobin: 9.5 g/dL — ABNORMAL LOW (ref 12.0–15.0)
MCH: 32 pg (ref 26.0–34.0)
MCH: 31.9 pg (ref 26.0–34.0)
MCHC: 34.4 g/dL (ref 30.0–36.0)
MCHC: 34.3 g/dL (ref 30.0–36.0)
MCV: 93.1 fL (ref 80.0–100.0)
MCV: 93 fL (ref 80.0–100.0)
Platelets: 144 10*3/uL — ABNORMAL LOW (ref 150–400)
Platelets: 147 10*3/uL — ABNORMAL LOW (ref 150–400)
RBC: 3.03 MIL/uL — ABNORMAL LOW (ref 3.87–5.11)
RBC: 2.98 MIL/uL — ABNORMAL LOW (ref 3.87–5.11)
RDW: 13.1 % (ref 11.5–15.5)
RDW: 13 % (ref 11.5–15.5)
WBC: 5.7 10*3/uL (ref 4.0–10.5)
WBC: 5.4 10*3/uL (ref 4.0–10.5)
nRBC: 0 % (ref 0.0–0.2)
nRBC: 0 % (ref 0.0–0.2)

## 2019-09-22 LAB — GLUCOSE, CAPILLARY
Glucose-Capillary: 153 mg/dL — ABNORMAL HIGH (ref 70–99)
Glucose-Capillary: 170 mg/dL — ABNORMAL HIGH (ref 70–99)
Glucose-Capillary: 162 mg/dL — ABNORMAL HIGH (ref 70–99)
Glucose-Capillary: 170 mg/dL — ABNORMAL HIGH (ref 70–99)

## 2019-09-22 LAB — RENAL FUNCTION PANEL
Albumin: 2.9 g/dL — ABNORMAL LOW (ref 3.5–5.0)
Anion gap: 7 (ref 5–15)
BUN: 26 mg/dL — ABNORMAL HIGH (ref 6–20)
CO2: 23 mmol/L (ref 22–32)
Calcium: 8.4 mg/dL — ABNORMAL LOW (ref 8.9–10.3)
Chloride: 107 mmol/L (ref 98–111)
Creatinine, Ser: 2.11 mg/dL — ABNORMAL HIGH (ref 0.44–1.00)
GFR calc Af Amer: 30 mL/min — ABNORMAL LOW (ref >60)
GFR calc non Af Amer: 26 mL/min — ABNORMAL LOW (ref >60)
Glucose, Bld: 134 mg/dL — ABNORMAL HIGH (ref 70–99)
Phosphorus: 2.2 mg/dL — ABNORMAL LOW (ref 2.5–4.6)
Potassium: 3.7 mmol/L (ref 3.5–5.1)
Sodium: 137 mmol/L (ref 135–145)

## 2019-09-22 MED ORDER — OXYCODONE HCL 5 MG PO TABS
5.0000 mg | ORAL_TABLET | Freq: Four times a day (QID) | ORAL | Status: DC | PRN
  Administered 2019-09-22 – 2019-09-23 (×5): 5 mg via ORAL
  Filled 2019-09-22 (×6): qty 1

## 2019-09-22 MED ORDER — LORAZEPAM 1 MG PO TABS
1.0000 mg | ORAL_TABLET | Freq: Four times a day (QID) | ORAL | Status: DC | PRN
  Administered 2019-09-22 (×2): 1 mg via ORAL
  Filled 2019-09-22 (×2): qty 1

## 2019-09-22 MED ORDER — FAMOTIDINE 20 MG PO TABS
20.0000 mg | ORAL_TABLET | Freq: Two times a day (BID) | ORAL | Status: DC
  Administered 2019-09-22 – 2019-09-23 (×3): 20 mg via ORAL
  Filled 2019-09-22 (×3): qty 1

## 2019-09-22 NOTE — Progress Notes (Signed)
Family Medicine Teaching Service Daily Progress Note Intern Pager: [phone]  Patient name: [name] [name] Medical record number: [id number] Date of birth: 03/14/1966 Age: 53 y.o. Gender: female  Primary Care Provider: McDiarmid, Blane Ohara, MD Consultants: GI Code Status:   Code Status: Full Code  Pt Overview and Major Events to Date:  09/19/19:  Admitted to medical telemetry  09/20/19:  EGD   Assessment and Plan:  [name] [name] is a 53 y.o. female presenting with hematemesis . PMH is significant for diabetic gastroparesis, anxiety disorder, marijuana use, CKD 4, T2DM, HTN  Hematemesis with intractable emesis in setting of chronic diabetic gatroparesis   Patient reports ongoing burning epigastric abdominal pain and persistent nausea.  States that she vomited once yesterday.  Patient reports improvement in pain with liquid solution she received yesterday.  However pain returned after taking Metamucil.  Hemoglobin 9.7 today from 11.1 yesterday.  Electrolytes remained stable.  Considered Carafate and Pepto bismol however patient has a history of CKD.  Patient was to have a EGD yesterday for suspected esophagitis in the setting of chronic vomiting however patient symptoms have improved and he EGD was canceled. GI restarted patient's Metamucil and Amitiza.  Patient is to follow-up outpatient as needed.  -Advance diet to full's. -Start Pepcid 20 mg twice daily -Continue maintenance IV fluids -Restart oral Ativan, oxycodone -GI signed off, appreciate recommendations -protonix 40mg  bid, po - CMP and CBC -Vitals per routine  Hyperglycemia  T2DM Stable.  Fasting CBG 153 this morning and patient received 2 units regular insulin in the previous 24 hours.  Advance diet to full's.  Last A1c 7.4 %. A1c   Home meds: lantus 10 U, jardiance 10mg  daily. -will hold lantus until diet can be advanced -CBG TID -sSSI with insulin regular (patient with allergy to aspart)   Tachycardia and HTN  Tachycardia  resolved . HR ranged 60-74.  Hypertension improving.  BP 127/76 - 159/62.   -Continue home medication -continuous cardiac monitoring -vitals per routine  Acute on CKD Stage 4 Resolved Cr 2.11 today from 3.25 on admission, likely elevated in setting of dehydration. Baseline ~2-2.5.  -Discontinue IV fluids  Tobacco abuse Smokes about 1 pack q3 days  -encourage cessation -can provide nicotine patch PRN  H/o alcohol use Patient with alcohol use, very difficult to ascertain amount and frequency of use. Also with home Ativan use.  -CIWA protocol without ativan to monitor for withdrawal in setting of both ETOH and ativan use, will add if scores are elevated   R chest port  Patient with implanted port in her right chest.    Patient reports he received a central line due to her chronic kidney disease progression. -monitor for signs of infection   FEN/GI: Fulls. protonix 40mg  po Prophylaxis:   SCDs  Disposition: likely home, pending medical stabilization   Subjective:  Reports ongoing burning epigastric pain and nausea.  Objective: Temp:  [97.9 F (36.6 C)-98.6 F (37 C)] 97.9 F (36.6 C) (09/26 0535) Pulse Rate:  [60-74] 66 (09/26 0535) Resp:  [17-18] 18 (09/26 0535) BP: (127-159)/(59-76) 159/62 (09/26 0535) SpO2:  [100 %] 100 % (09/26 0535) Physical Exam:  GEN: Resting comfortably in bed, in no acute distress CV: regular rate and rhythm, no murmurs appreciated, right upper chest port RESP: no increased work of breathing, clear to ascultation bilaterally ABD: Moderate epigastric tenderness, guarding, no rebound, no palpable masses, no CVA tenderness MSK: no lower extremity edema SKIN: warm, dry NEURO: grossly normal, moves all extremities appropriately  Laboratory: Recent Labs  Lab 09/20/19 0353 09/21/19 0412 09/22/19 0418  WBC 14.5* 7.8 5.7  HGB 12.6 11.1* 9.7*  HCT 35.3* 33.4* 28.2*  PLT 225 196 144*   Recent Labs  Lab 09/19/19 1215  09/20/19 0353  09/21/19 0412 09/22/19 0418  NA 130*   < > 139 137 137  K 4.1   < > 3.9 3.8 3.7  CL 85*   < > 106 106 107  CO2 26   < > 20* 24 23  BUN 76*   < > 64* 43* 26*  CREATININE 4.59*   < > 3.25* 2.51* 2.11*  CALCIUM 9.9   < > 9.1 8.9 8.4*  PROT 8.8*  --  6.9 6.3*  --   BILITOT 1.2  --  1.0 0.9  --   ALKPHOS 112  --  87 72  --   ALT 19  --  16 16  --   AST 26  --  28 27  --   GLUCOSE 429*   < > 106* 147* 134*   < > = values in this interval not displayed.   Mg 2.3      Imaging/Diagnostic Tests: No results found.   [name] Hensen, MD 09/22/2019, 9:21 AM PGY-1, [place] Intern pager: [phone], text pages welcome

## 2019-09-22 NOTE — Discharge Summary (Signed)
[facility] Discharge Summary  Patient name: [name] [name] Medical record number: [id number] Date of birth: November 15, 1966 Age: 53 y.o. Gender: female Date of Admission: 09/19/2019  Date of Discharge: 09/23/19 Admitting Physician: Leeanne Rio, MD  Primary Care Provider: McDiarmid, Blane Ohara, MD Consultants: GI  Indication for Hospitalization: Hematemesis   Discharge Diagnoses/Problem List:  Hematemesis Chronic Diabetic Gastroparesis Anemia Hyperglycemia T2DM HTN Acute on CKD stage 4 Tobacco abuse History of Alcohol use  Disposition: Home  Discharge Condition: Improved  Discharge Exam:  General: well nourished, well developed, in no acute distress with non-toxic appearance, resting comfortably in bed this AM CV: regular rate and rhythm without murmurs, rubs, or gallops, no lower extremity edema Lungs: clear to auscultation bilaterally with normal work of breathing Abdomen: soft, tender to epigastric area to light palpation, normoactive bowel sounds Skin: warm, dry Extremities: warm and well perfused Neuro: Alert and oriented, speech normal   Brief Hospital Course:  [name] [name] is a 53 y.o. female presented to the emergency department after having hematemesis and abdominal pain.  In the ED, patient was tachycardic and hypertensive.  EKG showed sinus tachycardia.  Chest x-ray was negative. CT abdomen/pelvis notable for intraluminal fluid density within the distal esophagus secondary to vomiting/reflux, otherwise negative. GI was consulted for suspected esophagitis in the setting of hematemesis who recommended patient have an EGD.  The next day, EGD was canceled as patient will became hypertensive, 782U systolic. Given stability of hemoglobin and resolution of hematemesis, GI opted to postpone EGD and recommended outpatient follow up with Dr. Henrene Pastor in 3-4 weeks. GI expected patient would have chronic upper abdominal pain thus she was instructed at  discharge to continue Protonix 40mg  BID x 2 weeks, then QD indefinitely. She was also instructed to take Pepcid BID, Metamucil, and Amitiza as prescribed. Patient's diet was slowly advanced and by day of discharge she was tolerating a soft diet. She was eager to be discharged thus was recommended to continue to advance diet slowly and follow up closely with GI and her PCP. Return precautions were discussed at length.   Pain was controlled with IV Dilaudid until she could be transitioned to her home medication.  Patient hemoglobin down trended from 14.2 but remained stable at 9 by day of discharge without any further episodes of hematemesis for >48 hours. Creatinine on admission was 3.25 and resolved with IV fluids prior to discharge. Her creatinine was 2.11 on day of discharge.  Patient is medically stable and appropriate for discharge with outpatient follow-up.    Issues for Follow Up:   Medication changes:   pantoprazole 40 mg BID for 2 weeks, then once daily  Pepcid 20mg  BID    Amitiza 24 mcg daily  Metamucil   TUMS as needed  Please ensure patient follows up with GI on 10/26/19 as scheduled  Recommend CBC to evaluate hemoglobin at follow up visit   Continue to encourage smoking and alcohol cessation  Significant Procedures: None   Significant Labs and Imaging:  Recent Labs  Lab 09/22/19 0418 09/22/19 1954 09/23/19 0337  WBC 5.7 5.4 5.2  HGB 9.7* 9.5* 9.3*  HCT 28.2* 27.7* 27.7*  PLT 144* 147* 154   Recent Labs  Lab 09/19/19 1215 09/19/19 1821 09/20/19 0016 09/20/19 0353 09/21/19 0412 09/22/19 0418  NA 130* 133* 138 139 137 137  K 4.1 3.4* 3.4* 3.9 3.8 3.7  CL 85*  --  102 106 106 107  CO2 26  --  19*  20* 24 23  GLUCOSE 429*  --  91 106* 147* 134*  BUN 76*  --  70* 64* 43* 26*  CREATININE 4.59*  --  3.66* 3.25* 2.51* 2.11*  CALCIUM 9.9  --  9.0 9.1 8.9 8.4*  MG  --   --   --  2.3 1.9  --   PHOS  --   --   --   --   --  2.2*  ALKPHOS 112  --   --  87 72  --    AST 26  --   --  28 27  --   ALT 19  --   --  16 16  --   ALBUMIN 4.7  --   --  3.7 3.5 2.9*    Lipase: 35  HS Trop: 42>31>44 FOBT: neg COVID: neg HIV: NR Hgb A1C: 7.4  Dg Chest 2 View  Result Date: 09/19/2019 CLINICAL DATA:  53 year old female with chest pain. EXAM: CHEST - 2 VIEW COMPARISON:  Chest radiograph dated 08/11/2018 FINDINGS: Right subclavian central venous line with tip over upper SVC again noted. Faint density over the right upper lung field likely represents the injection port of the central venous line and superimposed on the patient. The lungs are clear. There is no pleural effusion or pneumothorax. The cardiac silhouette is within normal limits. No acute osseous pathology. IMPRESSION: 1. No active cardiopulmonary disease. 2. Stable appearance of the right subclavian central venous line. Electronically Signed   By: Anner Crete M.D.   On: 09/19/2019 19:19   Ct Renal Stone Study  Result Date: 09/19/2019 CLINICAL DATA:  Initial evaluation for acute generalized abdominal pain. EXAM: CT ABDOMEN AND PELVIS WITHOUT CONTRAST TECHNIQUE: Multidetector CT imaging of the abdomen and pelvis was performed following the standard protocol without IV contrast. COMPARISON:  Prior CT from 03/01/2019. FINDINGS: Lower chest: Visualized lung bases are clear. Hepatobiliary: Limited noncontrast evaluation liver is unremarkable. Gallbladder surgically absent. No biliary dilatation. Pancreas: Pancreas within normal limits. Spleen: Spleen within normal limits. Adrenals/Urinary Tract: Adrenal glands are normal. Kidneys equal in size without nephrolithiasis or hydronephrosis. No radiopaque calculi seen along the course of either renal collecting system. No hydroureter. Partially distended bladder within normal limits. No layering stones seen within the bladder lumen. Stomach/Bowel: Fluid density seen within the visualized lower esophageal lumen, which could be related to vomiting or reflux. Stomach  partially distended without acute finding. No evidence for bowel obstruction. Normal appendix. No acute inflammatory changes seen about the bowels. Vascular/Lymphatic: Mild to moderate aorto bi-iliac atherosclerotic disease. No aneurysm. No adenopathy. Reproductive: Uterus is absent. Ovaries within normal limits. Other: No free air or fluid. Musculoskeletal: No acute osseous finding. No discrete lytic or blastic osseous lesions. IMPRESSION: 1. No CT evidence for nephrolithiasis or obstructive uropathy. 2. Intraluminal fluid density within the distal esophageal lumen, which could be related to vomiting and/or reflux disease. 3. No other acute intra-abdominal or pelvic process identified. 4. Status post cholecystectomy and hysterectomy. Electronically Signed   By: Jeannine Boga M.D.   On: 09/19/2019 20:04   Urinalysis    Component Value Date/Time   COLORURINE YELLOW 09/19/2019 1210   APPEARANCEUR CLEAR 09/19/2019 1210   LABSPEC 1.015 09/19/2019 1210   PHURINE 6.0 09/19/2019 1210   GLUCOSEU >=500 (A) 09/19/2019 1210   HGBUR MODERATE (A) 09/19/2019 1210   HGBUR small 10/19/2010 0852   BILIRUBINUR NEGATIVE 09/19/2019 1210   BILIRUBINUR negative 07/16/2019 1055   BILIRUBINUR NEG 10/01/2016 0906   KETONESUR NEGATIVE  09/19/2019 1210   PROTEINUR 100 (A) 09/19/2019 1210   UROBILINOGEN 0.2 07/16/2019 1055   UROBILINOGEN 0.2 09/26/2015 1031   NITRITE NEGATIVE 09/19/2019 1210   LEUKOCYTESUR NEGATIVE 09/19/2019 1210   Results/Tests Pending at Time of Discharge: None  Discharge Medications:  Allergies as of 09/23/2019      Reactions   Bee Venom Anaphylaxis   Required hospital visit to ER   Tegaderm Ag Mesh [silver] Dermatitis   And [place] tape- causes blisters and itching   Acetaminophen Nausea And Vomiting   Novolog [insulin Aspart] Other (See Comments)   Muscles in feet cramp   Statins Other (See Comments)   GI intolerance (N/V)   Lyrica [pregabalin] Other (See Comments)   Leg  cramping   Tape    Blisters   Ceftriaxone Other (See Comments)   Caused an ulcer in her mouth   Erythromycin Nausea And Vomiting      Medication List    TAKE these medications   amLODipine 5 MG tablet Commonly known as: NORVASC Take 5 mg by mouth at bedtime.   calcium carbonate 500 MG chewable tablet Commonly known as: TUMS - dosed in mg elemental calcium Chew 1 tablet (200 mg of elemental calcium total) by mouth 3 (three) times daily as needed for indigestion or heartburn.   carvedilol 6.25 MG tablet Commonly known as: COREG TAKE 1 TABLET BY MOUTH TWICE DAILY WITH MEALS   cyclobenzaprine 10 MG tablet Commonly known as: FLEXERIL Take 1 tablet (10 mg total) by mouth 3 (three) times daily as needed for muscle spasms.   diclofenac sodium 1 % Gel Commonly known as: VOLTAREN Apply 4 g topically 4 (four) times daily. Apply to shoulder   empagliflozin 10 MG Tabs tablet Commonly known as: JARDIANCE Take 10 mg by mouth daily.   famotidine 20 MG tablet Commonly known as: PEPCID Take 1 tablet (20 mg total) by mouth 2 (two) times daily.   glucose blood test strip Accu-Chek Aviva strips. Check blood glucose 3 times a day.   Insulin Pen Needle 31G X 5 MM Misc Commonly known as: B-D UF III MINI PEN NEEDLES Check CBGs three times a day for E11.9   Lantus SoloStar 100 UNIT/ML Solostar Pen Generic drug: Insulin Glargine Inject 20 Units into the skin at bedtime.   LORazepam 1 MG tablet Commonly known as: ATIVAN TAKE 1 TABLET BY MOUTH EVERY 6 HOURS AS NEEDED FOR CYCLIC VOMITING What changed: See the new instructions.   lubiprostone 24 MCG capsule Commonly known as: Amitiza TAKE 1 CAPSULE(24 MCG) BY MOUTH TWICE DAILY WITH A MEAL What changed:   how much to take  how to take this  when to take this  additional instructions   oxyCODONE 5 MG immediate release tablet Commonly known as: Oxy IR/ROXICODONE Take 1 tablet (5 mg total) by mouth every 6 (six) hours as needed for  severe pain (cyclic vomiting attack). What changed: reasons to take this   pantoprazole 40 MG tablet Commonly known as: PROTONIX Take 1 tablet (40 mg total) by mouth 2 (two) times daily.   prochlorperazine 5 MG tablet Commonly known as: COMPAZINE Take 1 tablet (5 mg total) by mouth every 6 (six) hours as needed for up to 5 days for nausea or vomiting.   psyllium 95 % Pack Commonly known as: HYDROCIL/METAMUCIL Take 1 packet by mouth 2 (two) times daily.       Discharge Instructions: Please refer to Patient Instructions section of EMR for full details.  Patient was  counseled important signs and symptoms that should prompt return to medical care, changes in medications, dietary instructions, activity restrictions, and follow up appointments.   Follow-Up Appointments: Follow-up Information    Irene Shipper, MD Follow up on 10/26/2019.   Specialty: Gastroenterology Why: 11:20 AM follow up the GI MD Contact information: [address] [place] [address] [phone]        McDiarmid, Blane Ohara, MD. Call in 1 day(s).   Specialty: Family Medicine Why: Please call to make an appointment for this week to see Dr. McDiarmid for hospital follow up Contact information: [place] [address] [place], [place], DO 09/23/2019, 4:55 PM PGY-2, [place]

## 2019-09-23 ENCOUNTER — Other Ambulatory Visit: Payer: Self-pay | Admitting: Family Medicine

## 2019-09-23 LAB — CBC
HCT: 27.7 % — ABNORMAL LOW (ref 36.0–46.0)
Hemoglobin: 9.3 g/dL — ABNORMAL LOW (ref 12.0–15.0)
MCH: 32 pg (ref 26.0–34.0)
MCHC: 33.6 g/dL (ref 30.0–36.0)
MCV: 95.2 fL (ref 80.0–100.0)
Platelets: 154 10*3/uL (ref 150–400)
RBC: 2.91 MIL/uL — ABNORMAL LOW (ref 3.87–5.11)
RDW: 13.1 % (ref 11.5–15.5)
WBC: 5.2 10*3/uL (ref 4.0–10.5)
nRBC: 0 % (ref 0.0–0.2)

## 2019-09-23 LAB — GLUCOSE, CAPILLARY
Glucose-Capillary: 116 mg/dL — ABNORMAL HIGH (ref 70–99)
Glucose-Capillary: 209 mg/dL — ABNORMAL HIGH (ref 70–99)

## 2019-09-23 MED ORDER — CALCIUM CARBONATE ANTACID 500 MG PO CHEW: 1.0000 | CHEWABLE_TABLET | Freq: Three times a day (TID) | ORAL | Status: DC | PRN

## 2019-09-23 MED ORDER — HEPARIN SOD (PORK) LOCK FLUSH 100 UNIT/ML IV SOLN
500.0000 [IU] | INTRAVENOUS | Status: AC | PRN
  Administered 2019-09-23: 500 [IU]
  Filled 2019-09-23: qty 5

## 2019-09-23 MED ORDER — PSYLLIUM 95 % PO PACK: 1.0000 | PACK | Freq: Two times a day (BID) | ORAL | Status: DC

## 2019-09-23 MED ORDER — FAMOTIDINE 20 MG PO TABS: 20.0000 mg | ORAL_TABLET | Freq: Two times a day (BID) | ORAL | 0 refills | Status: DC

## 2019-09-23 MED ORDER — PROCHLORPERAZINE MALEATE 5 MG PO TABS: 5.0000 mg | ORAL_TABLET | Freq: Four times a day (QID) | ORAL | 0 refills | Status: DC | PRN

## 2019-09-23 MED ORDER — PANTOPRAZOLE SODIUM 40 MG PO TBEC: 40.0000 mg | DELAYED_RELEASE_TABLET | Freq: Two times a day (BID) | ORAL | 0 refills | Status: DC

## 2019-09-23 NOTE — Progress Notes (Signed)
Family Medicine Teaching Service Daily Progress Note Intern Pager: [phone]  Patient name: [name] [name] Medical record number: [id number] Date of birth: [birth year] Age: 53 y.o. Gender: female  Primary Care Provider: McDiarmid, Blane Ohara, MD Consultants: GI Code Status:   Code Status: Full Code  Pt Overview and Major Events to Date:  09/19/19:  Admitted to medical telemetry, CT abd/pelvis: no nephrolithiasis/obstructie uropathy, intraluminal fluid density w/in distal esophagus 2/2 to vomiting/reflux, otherwise negative   Assessment and Plan:  [name] [name] is a 53 y.o. female presenting with hematemesis . PMH is significant for diabetic gastroparesis, anxiety disorder, marijuana use, CKD 4, T2DM, HTN  Hematemesis with intractable emesis in setting of chronic diabetic gatroparesis  Hgb stable this AM, 9.5>9.3 this AM. Continues to complain of epigastric pain, however does note improvement since yesterday. She has tolerated FLD well and is ready to advance to soft diet. She denies any further nausea or vomiting but does note a darker stool yesterday. Patient started on Pepcid and Protonix. Some concern for gastric ulcer given continued abdominal pain. Some improvement with addition of Pepcid is reassuring.  - advance to soft diet - Continue Pepcid 20mg  BID and Protonix 40mg  BID - Continue Metamucil and Amitiza - consider reconsulting GI if not tolerating soft diet, recommend outpatient follow up as needed - AM CBC, CMP - vitals per routine  - continue home Ativan, Oxycodone 5mg  q6 PRN - Phenergan 12.5mg  IV q8 PRN  Hyperglycemia (resolved)  T2DM Stable.  Fasting CBG 116 this morning, CBG's overnight 110-170. Received 2U of Novolin with each meal. Advanced to full liquid diet overnight. Last A1c 7.4 %. A1c   Home meds: lantus 10 U, jardiance 10mg  daily. -will hold lantus, restart at discharge -CBG TID -sSSI - advance to soft diet  HTN: BP 120-140s/60's overnight. Home meds:  Amlodipine 5mg  qHS, Coreg 6.25mg  BID,  -Continue home medication -continuous cardiac monitoring -vitals per routine  Acute on CKD Stage 4, resolved - continue to monitor - avoid nephrotoxic meds  Tobacco abuse  H/o Alcohol Use:  Smokes about 1 pack q3 days. Also endorses alcohol use, although unsure how much. Home meds also include Ativan. -encourage smoking and alcohol cessation -can provide nicotine patch PRN  R chest port  Patient with implanted port in her right chest.    Patient reports he received a central line due to her chronic kidney disease progression. -monitor for signs of infection   FEN/GI: Fulls. protonix 40mg  po, Pepcid Prophylaxis:   SCDs  Disposition: likely home, pending toleration of diet  Subjective:   Reports ongoing but improved epigastric pain. Denies any nausea or vomiting. Notes darker stool yesterday.  Objective: Temp:  [98.3 F (36.8 C)-98.7 F (37.1 C)] 98.3 F (36.8 C) (09/27 0440) Pulse Rate:  [55-66] 66 (09/27 0440) Resp:  [16-22] 18 (09/27 0440) BP: (123-150)/(60-63) 130/63 (09/27 0440) SpO2:  [100 %] 100 % (09/27 0440) Physical Exam: General: well nourished, well developed, in no acute distress with non-toxic appearance, resting comfortably in bed this AM CV: regular rate and rhythm without murmurs, rubs, or gallops, no lower extremity edema Lungs: clear to auscultation bilaterally with normal work of breathing Abdomen: soft, tender to epigastric area to light palpation, normoactive bowel sounds Skin: warm, dry Extremities: warm and well perfused Neuro: Alert and oriented, speech normal  Laboratory: Recent Labs  Lab 09/22/19 0418 09/22/19 1954 09/23/19 0337  WBC 5.7 5.4 5.2  HGB 9.7* 9.5* 9.3*  HCT 28.2* 27.7* 27.7*  PLT 144* 147*  154   Recent Labs  Lab 09/19/19 1215  09/20/19 0353 09/21/19 0412 09/22/19 0418  NA 130*   < > 139 137 137  K 4.1   < > 3.9 3.8 3.7  CL 85*   < > 106 106 107  CO2 26   < > 20* 24 23  BUN  76*   < > 64* 43* 26*  CREATININE 4.59*   < > 3.25* 2.51* 2.11*  CALCIUM 9.9   < > 9.1 8.9 8.4*  PROT 8.8*  --  6.9 6.3*  --   BILITOT 1.2  --  1.0 0.9  --   ALKPHOS 112  --  87 72  --   ALT 19  --  16 16  --   AST 26  --  28 27  --   GLUCOSE 429*   < > 106* 147* 134*   < > = values in this interval not displayed.   Lipase: 35  HS Trop: 42>31>44 FOBT: neg COVID: neg HIV: NR Hgb A1C: 7.4  Imaging/Diagnostic Tests: No results found.   Mina Marble [place], DO 09/23/2019, 8:40 AM PGY-2, [place] Intern pager: [phone], text pages welcome

## 2019-09-23 NOTE — Discharge Instructions (Signed)
You were hospitalized because you were vomiting blood. We discussed this with gastroenterology and they recommended you take Protonix twice a day for 2 weeks then you can take this once a day indefinitely. They also recommended you take Pepcid twice a day as well. Please also be sure to take Amitiza daily and metamucil. You are likely to have continued abdominal pain for some time. If the above meds do not help, you can also try Tums to see if that helps with your discomfort. Please be sure to follow up with the [facility] doctor as scheduled. Also, please be sure to call the Family Medicine clinic to set up a hospital follow up with Dr. McDiarmid this coming week. Please call GI or the clinic sooner if your have recurrence of your symptoms.  I have sent you home with a few days of Compazine to help with your nausea and vomiting. This medicine can be very sedating. Please be sure to avoid drying or operating heavy machinery when taking this medicine.   Thank you so much for letting us take care of you.  Take care, Family Medicine

## 2019-09-23 NOTE — Progress Notes (Signed)
[name] [name] to be D/C'd  per MD order. Discussed with the patient and all questions fully answered.  VSS, Skin clean, dry and intact without evidence of skin break down, no evidence of skin tears noted.  IV catheter discontinued intact. Site without signs and symptoms of complications. Dressing and pressure applied.  An After Visit Summary was printed and given to the patient. Patient received prescription.  D/c education completed with patient/family including follow up instructions, medication list, d/c activities limitations if indicated, with other d/c instructions as indicated by MD - patient able to verbalize understanding, all questions fully answered.   Patient instructed to return to ED, call 911, or call MD for any changes in condition.   Patient to be escorted via [place], and D/C home via private auto.

## 2019-09-24 ENCOUNTER — Telehealth: Payer: Self-pay | Admitting: Pharmacist

## 2019-09-24 NOTE — Telephone Encounter (Signed)
Called patient RE: Jardiance 10 mg initiation on 9/10. Patient recently went to the ED for upper GI bleed with vomiting. Patient stated she has not been able to restart taking her medications. Patient had been taking Jardiance for ~1 week prior to N/V symptom start. Patient stated her CBG in the morning was between 100-170 the first 3 days after starting Jardiance, and when N/V started, was up to 400s. Instructed patient that when she can start taking medications again to continue taking the Jardiance 10 mg. Pharmacy will call in 3-4 weeks to follow up and possibly increase dose.   Patient called by: Murlean Iba, PharmD Candidate

## 2019-10-01 ENCOUNTER — Other Ambulatory Visit: Payer: Self-pay | Admitting: Family Medicine

## 2019-10-01 DIAGNOSIS — E1143 Type 2 diabetes mellitus with diabetic autonomic (poly)neuropathy: Secondary | ICD-10-CM

## 2019-10-01 DIAGNOSIS — K3184 Gastroparesis: Secondary | ICD-10-CM

## 2019-10-01 DIAGNOSIS — R1115 Cyclical vomiting syndrome unrelated to migraine: Secondary | ICD-10-CM

## 2019-10-05 ENCOUNTER — Other Ambulatory Visit: Payer: Self-pay

## 2019-10-05 ENCOUNTER — Encounter: Payer: Self-pay | Admitting: Cardiovascular Disease

## 2019-10-05 ENCOUNTER — Ambulatory Visit (INDEPENDENT_AMBULATORY_CARE_PROVIDER_SITE_OTHER): Payer: 59 | Admitting: Cardiovascular Disease

## 2019-10-05 DIAGNOSIS — I10 Essential (primary) hypertension: Secondary | ICD-10-CM

## 2019-10-05 DIAGNOSIS — Z720 Tobacco use: Secondary | ICD-10-CM | POA: Diagnosis not present

## 2019-10-05 NOTE — Assessment & Plan Note (Signed)
Patient essential potential blood pressure measured measured today 118/58.  She is on carvedilol and amlodipine.

## 2019-10-05 NOTE — Progress Notes (Signed)
10/05/2019 [name] [name]   [birth year]  [id number]  Primary Physician McDiarmid, Blane Ohara, MD Primary Cardiologist: Lorretta Harp MD Garret Reddish, [place], [place]  HPI:  [name] [name] is a 53 y.o.  mildly overweight single African-American female mother of 4 children, grandmother of 8 grandchildren who is currently disabled because of vomiting. She was initially referred by hospital emergency room because of atypical chest pain. Risk factors include continued tobacco abuse of one third pack per day having smoked approximately 10-15 pack years. She has history of hypertension, diabetes and untreated hyperlipidemia. Her mother did die of myocardial infarction age 51. She had cardiac catheterization performed over 10 years ago that showed normal coronary arteries. She gets occasional atypical chest pain occurring every other month or so which is brief, sharp and nonischemic.  Unfortunately, she lost her youngest son since I last saw her of a gunshot wound.  She otherwise has remained stable denying chest pain or shortness of breath.  She does continue to smoke however.    Current Meds  Medication Sig  . amLODipine (NORVASC) 5 MG tablet Take 5 mg by mouth at bedtime.   . calcium carbonate (TUMS - DOSED IN MG ELEMENTAL CALCIUM) 500 MG chewable tablet Chew 1 tablet (200 mg of elemental calcium total) by mouth 3 (three) times daily as needed for indigestion or heartburn.  . carvedilol (COREG) 6.25 MG tablet TAKE 1 TABLET BY MOUTH TWICE DAILY WITH MEALS (Patient taking differently: Take 6.25 mg by mouth 2 (two) times daily with a meal. )  . cyclobenzaprine (FLEXERIL) 10 MG tablet Take 1 tablet (10 mg total) by mouth 3 (three) times daily as needed for muscle spasms.  . diclofenac sodium (VOLTAREN) 1 % GEL Apply 4 g topically 4 (four) times daily. Apply to shoulder  . empagliflozin (JARDIANCE) 10 MG TABS tablet Take 10 mg by mouth daily.  . famotidine (PEPCID) 20 MG tablet TAKE 1 TABLET(20 MG) BY  MOUTH TWICE DAILY  . glucose blood test strip Accu-Chek Aviva strips. Check blood glucose 3 times a day.  . Insulin Glargine (LANTUS SOLOSTAR) 100 UNIT/ML Solostar Pen Inject 20 Units into the skin at bedtime.   . Insulin Pen Needle (B-D UF III MINI PEN NEEDLES) 31G X 5 MM MISC Check CBGs three times a day for E11.9  . LORazepam (ATIVAN) 1 MG tablet TAKE 1 TABLET BY MOUTH EVERY 6 HOURS AS NEEDED FOR CYCLIC VOMITING (Patient taking differently: Take 1 mg by mouth every 6 (six) hours as needed ("for cyclic vomiting"). )  . lubiprostone (AMITIZA) 24 MCG capsule TAKE 1 CAPSULE(24 MCG) BY MOUTH TWICE DAILY WITH A MEAL (Patient taking differently: Take 24 mcg by mouth 2 (two) times daily with a meal. )  . oxyCODONE (OXY IR/ROXICODONE) 5 MG immediate release tablet Take 1 tablet (5 mg total) by mouth every 6 (six) hours as needed for severe pain (cyclic vomiting attack). (Patient taking differently: Take 5 mg by mouth every 6 (six) hours as needed for severe pain. )  . pantoprazole (PROTONIX) 40 MG tablet TAKE 1 TABLET(40 MG) BY MOUTH TWICE DAILY  . promethazine (PHENERGAN) 12.5 MG tablet TAKE 1 TABLET(12.5 MG) BY MOUTH EVERY 6 HOURS AS NEEDED FOR NAUSEA OR VOMITING  . psyllium (HYDROCIL/METAMUCIL) 95 % PACK Take 1 packet by mouth 2 (two) times daily.     Allergies  Allergen Reactions  . Bee Venom Anaphylaxis    Required hospital visit to ER  . Tegaderm Ag Mesh [Silver]  Dermatitis    And S[place]av[place] tape- causes blisters and itching  . Acetaminophen Nausea And Vomiting  . Novolog [Insulin Aspart] Other (See Comments)    Muscles in feet cramp  . Statins Other (See Comments)    GI intolerance (N/V)  . Lyrica [Pregabalin] Other (See Comments)    Leg cramping  . Tape     Blisters  . Ceftriaxone Other (See Comments)    Caused an ulcer in her mouth  . Erythromycin Nausea And Vomiting    Social History   Socioeconomic History  . Marital status: Single    Spouse name: Not on file  . Number of  children: 5  . Years of education: 10  . Highest education level: Not on file  Occupational History  . Occupation: disabled  Social Needs  . Financial resource strain: Not on file  . Food insecurity    Worry: Not on file    Inability: Not on file  . Transportation needs    Medical: Not on file    Non-medical: Not on file  Tobacco Use  . Smoking status: Current Some Day Smoker    Packs/day: 0.50    Years: 22.00    Pack years: 11.00    Types: Cigarettes    Start date: 12/27/1982  . Smokeless tobacco: Never Used  Substance and Sexual Activity  . Alcohol use: Yes    Comment: occaisionally; pt reports drinking beer to help her when constipated  . Drug use: Yes    Types: Marijuana    Comment: pt reports she has not "smoked" in 2 wks  . Sexual activity: Yes    Partners: Male    Birth control/protection: None  Lifestyle  . Physical activity    Days per week: Not on file    Minutes per session: Not on file  . Stress: Not on file  Relationships  . Social Herbalist on phone: Not on file    Gets together: Not on file    Attends religious service: Not on file    Active member of club or organization: Not on file    Attends meetings of clubs or organizations: Not on file    Relationship status: Not on file  . Intimate partner violence    Fear of current or ex partner: Not on file    Emotionally abused: Not on file    Physically abused: Not on file    Forced sexual activity: Not on file  Other Topics Concern  . Not on file  Social History Narrative   Lives alone in [place], [place]. Daughter and 2 grandchildren nearby   Has had 4 children. Living Children are [place]   Has 10 grandchildren     Review of Systems: General: negative for chills, fever, night sweats or weight changes.  Cardiovascular: negative for chest pain, dyspnea on exertion, edema, orthopnea, palpitations, paroxysmal nocturnal dyspnea or shortness of breath Dermatological: negative  for rash Respiratory: negative for cough or wheezing Urologic: negative for hematuria Abdominal: negative for nausea, vomiting, diarrhea, bright red blood per rectum, melena, or hematemesis Neurologic: negative for visual changes, syncope, or dizziness All other systems reviewed and are otherwise negative except as noted above.    Blood pressure (!) 118/58, pulse 60, temperature (!) 96.6 F (35.9 C), height 5\' 1"  (1.549 m), weight 145 lb (65.8 kg).  General appearance: alert and no distress Neck: no adenopathy, no carotid bruit, no JVD, supple, symmetrical, trachea midline and thyroid not enlarged,  symmetric, no tenderness/mass/nodules Lungs: clear to auscultation bilaterally Heart: regular rate and rhythm, S1, S2 normal, no murmur, click, rub or gallop Extremities: extremities normal, atraumatic, no cyanosis or edema Pulses: 2+ and symmetric Skin: Skin color, texture, turgor normal. No rashes or lesions Neurologic: Alert and oriented X 3, normal strength and tone. Normal symmetric reflexes. Normal coordination and gait  EKG not performed today  ASSESSMENT AND PLAN:   Tobacco abuse Ongoing tobacco use of a third of a pack a day recalcitrant to risk factor modification.  Essential hypertension Patient essential potential blood pressure measured measured today 118/58.  She is on carvedilol and amlodipine.      Lorretta Harp MD FACP,FACC,FAHA, [facility] 10/05/2019 10:19 AM

## 2019-10-05 NOTE — Patient Instructions (Signed)
Medication Instructions:  Your physician recommends that you continue on your current medications as directed. Please refer to the Current Medication list given to you today. If you need a refill on your cardiac medications before your next appointment, please call your pharmacy.   Lab work: none If you have labs (blood work) drawn today and your tests are completely normal, you will receive your results only by: Marland Kitchen MyChart Message (if you have MyChart) OR . A paper copy in the mail If you have any lab test that is abnormal or we need to change your treatment, we will call you to review the results.  Testing/Procedures: none  Follow-Up: At [facility], you and your health needs are our priority.  As part of our continuing mission to provide you with exceptional heart care, we have created designated Provider Care Teams.  These Care Teams include your primary Cardiologist (physician) and Advanced Practice Providers (APPs -  Physician Assistants and Nurse Practitioners) who all work together to provide you with the care you need, when you need it. . You will need a follow up appointment in 12 months with an APP and in 24 months with Dr. Quay Burow.  Please call our office 2 months in advance to schedule this/each appointment.  You may see Dr. Gwenlyn Found or one of the following Advanced Practice Providers on your designated Care Team:   . Kerin Ransom, PA-C . Daleen Snook Kroeger, PA-C . Sande Rives, PA-C

## 2019-10-05 NOTE — Assessment & Plan Note (Signed)
Ongoing tobacco use of a third of a pack a day recalcitrant to risk factor modification.

## 2019-10-10 ENCOUNTER — Telehealth: Payer: Self-pay | Admitting: Family Medicine

## 2019-10-10 NOTE — Telephone Encounter (Signed)
Gerlene Burdock , NP from [employer] who saw the patient for her annual wellness visit yesterday has some concerns that she wanted to speak to Dr. McDiarmid about if she can.  Her phone number is [phone].

## 2019-10-11 ENCOUNTER — Telehealth: Payer: Self-pay

## 2019-10-11 NOTE — Telephone Encounter (Signed)
Stanton Kidney, FNP with patients insurance, calls nurse line to stated the patient was very tearful during annual wellness exam yesterday. Stanton Kidney stated the patient expressed wanting to see [name] again, here in our office. Stanton Kidney also stated she thinks the patient would benefit from SSRI therapy as well.   Will forward to PCP and LCSW

## 2019-10-12 DIAGNOSIS — M25511 Pain in right shoulder: Secondary | ICD-10-CM | POA: Diagnosis not present

## 2019-10-12 NOTE — Telephone Encounter (Signed)
Patient has an appt with PCP on 11/08/2019, would you like me to get this sooner if possible?  [name],CMA

## 2019-10-12 NOTE — Telephone Encounter (Signed)
    Care Coordination Phone outreach Note Social Work   10/12/2019 Name: [name] [name] MRN: [id number] DOB: 1966/02/02  [name] [name] is a 53 y.o. year old female who sees McDiarmid, Blane Ohara, MD for primary care. LCSW was consulted for assistance with Mental Health Counseling and Resources. Called patient to assess needs and barriers. Assessment : Patient reports she is hanging in there, would like to see PCP and not interested in seeing another provider.  Discussed previous therapist patient was connected with in [place].  Patient had several sessions with Garrel Ridgel but did not feel that it was beneficial to her so she stopped going.  Patient states she relies heavily on her faith and that is what is helping her now. Recommendation:Patient may benefit from ongoing therapy to assist with managing her symptoms however she is not interested at this time. Interventions: Patient interviewed and appropriate assessments performed. Discussed long term therapy/counseling. Other interventions: . Solution-Focused Strategies,    Plan: No further follow up required: LCSW   Dr. McDiarmid has been informed of this outreach and plan.  [name], LCSW Clinical Social Worker [place] / [place]   [phone] 4:08 PM

## 2019-10-12 NOTE — Telephone Encounter (Signed)
Ms Laurance Flatten - Would you be able to contact Ms Benning to follow up on setting up long term counseling?  [name] - Could you set up Ms Woodyard for an appointment in person on telephone visit to discuss antidepressant therapy?

## 2019-10-15 DIAGNOSIS — M25511 Pain in right shoulder: Secondary | ICD-10-CM | POA: Diagnosis not present

## 2019-10-17 DIAGNOSIS — M25511 Pain in right shoulder: Secondary | ICD-10-CM | POA: Diagnosis not present

## 2019-10-25 ENCOUNTER — Telehealth: Payer: Self-pay | Admitting: Pharmacist

## 2019-10-25 NOTE — Telephone Encounter (Signed)
Attempted contact with patient - no answer.   Left HIPAA compliant message requesting call back.   I would like to discuss blood sugar and Jardiance when she returns call.   Provided my direct office # for call back.

## 2019-10-26 ENCOUNTER — Encounter: Payer: Self-pay | Admitting: Internal Medicine

## 2019-10-26 ENCOUNTER — Ambulatory Visit (INDEPENDENT_AMBULATORY_CARE_PROVIDER_SITE_OTHER): Payer: 59 | Admitting: Internal Medicine

## 2019-10-26 ENCOUNTER — Other Ambulatory Visit: Payer: Self-pay

## 2019-10-26 VITALS — BP 150/70 | HR 60 | Temp 98.2°F | Ht 62.5 in | Wt 151.1 lb

## 2019-10-26 DIAGNOSIS — K3184 Gastroparesis: Secondary | ICD-10-CM | POA: Diagnosis not present

## 2019-10-26 DIAGNOSIS — K2101 Gastro-esophageal reflux disease with esophagitis, with bleeding: Secondary | ICD-10-CM

## 2019-10-26 DIAGNOSIS — K5909 Other constipation: Secondary | ICD-10-CM

## 2019-10-26 DIAGNOSIS — R112 Nausea with vomiting, unspecified: Secondary | ICD-10-CM

## 2019-10-26 DIAGNOSIS — R1084 Generalized abdominal pain: Secondary | ICD-10-CM | POA: Diagnosis not present

## 2019-10-26 DIAGNOSIS — Z1159 Encounter for screening for other viral diseases: Secondary | ICD-10-CM

## 2019-10-26 MED ORDER — OMEPRAZOLE 40 MG PO CPDR: 40.0000 mg | DELAYED_RELEASE_CAPSULE | Freq: Every day | ORAL | 11 refills | Status: DC

## 2019-10-26 MED ORDER — NA SULFATE-K SULFATE-MG SULF 17.5-3.13-1.6 GM/177ML PO SOLN: 1.0000 | Freq: Once | ORAL | 0 refills | Status: AC

## 2019-10-26 NOTE — Patient Instructions (Addendum)
If you are age 54 or older, your body mass index should be between 23-30. Your Body mass index is 27.2 kg/m. If this is out of the aforementioned range listed, please consider follow up with your Primary Care Provider.  If you are age 64 or younger, your body mass index should be between 19-25. Your Body mass index is 27.2 kg/m. If this is out of the aformentioned range listed, please consider follow up with your Primary Care Provider.   You have been scheduled for a colonoscopy. Please follow written instructions given to you at your visit today.  Please pick up your prep supplies at the pharmacy within the next 1-3 days. If you use inhalers (even only as needed), please bring them with you on the day of your procedure.  We have sent the following medications to your pharmacy for you to pick up at your convenience:  Omeprazole 40 mg daily   Mirilax 17 grams 1 capful daily  Thank you for choosing me and [facility]  Scarlette Shorts, MD  .

## 2019-10-26 NOTE — Progress Notes (Signed)
HISTORY OF PRESENT ILLNESS:  [name] [name] is a 53 y.o. female with multiple medical problems including hypertension, insulin requiring diabetes mellitus, end-stage renal disease, chronic pain syndrome on narcotics, and multiple prior surgeries including cholecystectomy and hysterectomy.  The patient's GI issues include problems with recurrent nausea and vomiting, documented gastroparesis (diabetes and/or narcotics), chronic cannabis use, esophagitis on multiple endoscopies, chronic functional abdominal pain, chronic constipation, and multiple colon polyps on colonoscopy last year.  She presents today after a 4-day hospital stay for intractable nausea and vomiting with transient hematemesis.  She was seen by GI but did not undergo upper endoscopy.  She was placed on pantoprazole therapy.  Patient tells me that since discharge she has been doing better in terms of nausea and vomiting.  She discontinued pantoprazole saying that it upset her stomach.  She tells me today that she has not had a bowel movement in 1 week despite Amitiza, eating apples, and fiber.  She complains of associated abdominal bloating.  Next, she is aware that she is due for surveillance colonoscopy.  Last examination November 2019 was 7 polyps which were adenomatous.  The bowel preparation was fair for which follow-up in 1 year with more extensive preparation was recommended.  Review of blood work from September 2020 finds hemoglobin on September 20, 2019 was 12.6.  On September 23, 2019 it was 9.3.  Last creatinine 2.11.  She was clearly dehydrated on admission.  Last hemoglobin A1c 7.4.  Review of x-ray file shows last CT scan from June 2016 with renal abnormalities and increased colonic stool.  REVIEW OF SYSTEMS:  All non-GI ROS negative unless otherwise stated in the HPI except for sinus and allergy, anxiety, arthritis, back pain, depression, fatigue, headaches, itching, muscle cramps, night sweats, sleeping problems, ankle  swelling  Past Medical History:  Diagnosis Date  . Abdominal pain, generalized   . Acquired bilateral hammer toes 01/20/2017  . Acute post-traumatic stress disorder 11/17/2018  . Allergy   . Anemia   . Anxiety   . Arthritis   . At risk for polypharmacy 08/15/2014  . Biceps tendinopathy of right upper extremity 04/05/2014  . Blood transfusion 2003  . Cataract    both eyes  . Chronic generalized abdominal pain   . Chronic generalized abdominal pain   . Chronic kidney disease (CKD), stage III (moderate)   . Chronic leg cramping 07/18/2014  . CKD (chronic kidney disease) 08/17/2013   Seen by Dr Joelyn Oms 07/28/15 ([place]) - (see scanned document): 20 years diabetes, 15 years HTN, stable CKD stage III with proteinuria.  - CKD, discussed NSAID avoidance, diabetes control with target A1c<7.5%, and HTN control with SBP<140. F/u 6 months and repeat renal panel at that time. - Microalbuminuria: On lisinopril 40mg , suspected secondary due to diabetic nephropathy. - Chronic back pain: No NSDAIDs. - Say amlodipine causes nausea  - Advised not to ACE-i during vomiting episodes    . Complication of anesthesia    "problems waking up"  . Cyclic vomiting syndrome   . Delayed gastric emptying 05/12/2011    04/12/11 NUCLEAR MEDICINE GASTRIC EMPTYING SCAN Radiopharmaceutical: 2.0 mCi Tc-40m sulfur colloid. Comparison: None. Findings: At 1 hour, 81% of the counts remain in the stomach. At 2 hours, 53% remain. Normal is less than 30% at 2 hours. IMPRESSION: Moderate delay in gastric emptying.      . Depression   . Depression with anxiety 02/23/2007   See [facility] dated 05/06/2016.  IC Gwenlyn Saran) was called in  and I did a brief assessment.   . Diabetic gastroparesis ([place])    /e-chart  . Diabetic ketoacidosis without coma associated with type 2 diabetes mellitus ([place])   . DKA (diabetic ketoacidoses) ([place]) 08/03/2016  . Dysuria 10/01/2016  . Elevated cholesterol 11/28/2015  . Emotional stress  12/30/2015   Stress after Breakup with partnes of 13 years on 12/27/2015   . Encounter for chronic pain management 01/06/2015   Indication for chronic opioid: Chronic back pain, cyclic vomiting Medication and dose: oxycodone 5mg   # pills per month: 15 Last UDS date: 05/08/2015 - result pending Pain contract signed (Y/N): Yes, reviewed 01/06/2015  Date narcotic database last reviewed (include red flags): 05/08/2015 (no red flags).    . Erosive esophagitis 08/06/2019  . Esophagitis 04/18/2013   Documented by Dr. Henrene Pastor EGD 03/2011  He also consulted on her during hospitalization 33/2951 for cyclical vomiting. No repeat EGD necessary, treat esophagitis with PPI.    Marland Kitchen Esophagitis determined by endoscopy 04/18/2013   Documented by Dr. Henrene Pastor EGD 03/2011  He also consulted on her during hospitalization 88/4166 for cyclical vomiting. No repeat EGD necessary, treat esophagitis with PPI.    Marland Kitchen Essential hypertension 05/01/2016  . Essential hypertension, malignant 02/23/2007   Jan 2015 - stopped recently-started norvasc due to reports of GI symptoms. 02/2015 - holding lisinopril, adding hydralazine 10mg  TID, and will take 1/2 tab lisinopril when feeling better when not in cyclic vomiting flare   . Essential hypertension, malignant 02/23/2007   Jan 2015 - stopped recently-started norvasc due to reports of GI symptoms. 02/2015 - holding lisinopril, adding hydralazine 10mg  TID, and will take 1/2 tab lisinopril when feeling better when not in cyclic vomiting flare   . GERD (gastroesophageal reflux disease)   . Heart murmur   . Hematuria 07/09/2015  . High risk social situation 08/31/2012  . Hip pain, bilateral 03/31/2018  . History of colonic polyps 11/15/2018   10/2017 screening colonoscopy Dr Scarlette Shorts: Seven polyps were found in the sigmoid colon, transverse colon, ascending colon and cecum. The polyps were 2 to 10 mm in size. These polyps were removed with a cold snare.   Marland Kitchen Hot flashes 07/29/2011  . Hypertension   .  Incontinence 10/03/2017  . INSOMNIA NOS 02/23/2007   Qualifier: Diagnosis of  By: Beryle Lathe    . Intermittent constipation 06/06/2015  . Intestinal impaction ([place])   . Intractable vomiting 07/02/2016  . Marijuana smoker, continuous    pt. denies does smoke marijuana  . MIGRAINE, UNSPEC., W/O INTRACTABLE MIGRAINE 02/23/2007   Qualifier: Diagnosis of  By: Beryle Lathe    . Myocardial infarction [facility]) 07/2007   "they say I've had a silent one; I don't know"  . Nausea and vomiting in adult 06/06/2013  . Neuromuscular disorder (HCC)    neuropathy in both legs  . NEUROPATHY, DIABETIC 02/23/2007  . Nonspecific low back pain 04/10/2007   Back pain is chronic. Oxycodone per prior notes helps pt be more active. Reportedly uses 4 tab/week oxy for intermittent worsening of chronic back pain but mostly for cyclic vom to keep her out of hospital.    . Normocytic anemia   . Onychomycosis 03/25/2015  . PANCREATITIS 05/09/2008  . Pneumonia   . Pre-ulcerative calluses 05/27/2017  . Protein-calorie malnutrition, severe ([place]) 12/17/2013  . Pure hypercholesterolemia 08/15/2014   Based on her choleserol, BP, smoker status, and that she is a diabetic, her 10 year ASCVD risk is 9.9% putting her in a category of risk  that would benefit from high-intensity statin.    . Rotator cuff syndrome 04/19/2014   rt. shoulder  . Smoker   . Stye external 09/02/2017  . TOBACCO DEPENDENCE 02/23/2007   2 cigarettes every few days 11/2012 3 cigarettes daily    . Type II diabetes mellitus ([place])   . Uncontrolled secondary diabetes mellitus with stage 3 CKD (GFR 30-59) (HCC) 10/23/2011   Nephropathy, gastroparesis  HgbA1c (06/2011) 7.6, 9.5, 12.2, 8.2, 10.9 (07/2012), 9.2 (02/11/2013), 7.7 (09/27/13)  Metformin: she has a history of cyclical vomiting; she would prefer not to take this medication  On lanuts 20U qam with improved control. 09/27/13   . Vitamin D deficiency 11/28/2014    Past Surgical History:  Procedure Laterality  Date  . ABDOMINAL HYSTERECTOMY  02/2002  . CARDIAC CATHETERIZATION    . CATARACT EXTRACTION W/ INTRAOCULAR LENS  IMPLANT, BILATERAL    . CESAREAN SECTION     x 3  . CHOLECYSTECTOMY  1980's  . COLONOSCOPY     patient not sure  . DILATION AND EVACUATION  X3  . laparotomy and lysis of adhesions    . left hand surgery    . PORT-A-CATH placement  ~ 2008   right chest; "poor access; frequent hospitalizations"  . SHOULDER ARTHROSCOPY Right 07/21/2019   [facility] (Orth[facility]): Arthroscopic rotator cuff repair; Arthroscopic distal clavicle resection; Arthroscopic subacromial decompression and bursectomy;  Debridement of degenerative labral tear  . SMALL INTESTINE SURGERY     laporotomy with lysis of adhesions.  Marland Kitchen UPPER GASTROINTESTINAL ENDOSCOPY    . UPPER GI ENDOSCOPY      Social History [name] [name]  reports that she has been smoking cigarettes. She started smoking about 36 years ago. She has a 11.00 pack-year smoking history. She has never used smokeless tobacco. She reports current alcohol use. She reports current drug use. Drug: Marijuana.  family history includes Alcohol abuse in her father; Cancer (age of onset: 68) in her sister; Diabetes in her sister, sister, and sister; Diabetes type II in her mother and sister; Heart attack in her mother; Hypertension in her mother and sister; Kidney disease in her mother; Other in her mother.  Allergies  Allergen Reactions  . Bee Venom Anaphylaxis    Required hospital visit to ER  . Tegaderm Ag Mesh [Silver] Dermatitis    And Sorbaview tape- causes blisters and itching  . Acetaminophen Nausea And Vomiting  . Novolog [Insulin Aspart] Other (See Comments)    Muscles in feet cramp  . Statins Other (See Comments)    GI intolerance (N/V)  . Lyrica [Pregabalin] Other (See Comments)    Leg cramping  . Tape     Blisters  . Ceftriaxone Other (See Comments)    Caused an ulcer in her mouth  . Erythromycin Nausea And Vomiting       PHYSICAL  EXAMINATION: Vital signs: BP (!) 150/70 (BP Location: Left Arm, Patient Position: Sitting)   Pulse 60   Temp 98.2 F (36.8 C)   Ht 5' 2.5" (1.588 m) Comment: height measured without shoes  Wt 151 lb 2 oz (68.5 kg)   BMI 27.20 kg/m   Constitutional: generally well-appearing, no acute distress Psychiatric: alert and oriented x3, cooperative Eyes: extraocular movements intact, anicteric, conjunctiva pink Mouth: oral pharynx moist, no lesions Neck: supple no lymphadenopathy Cardiovascular: heart regular rate and rhythm, no murmur Lungs: clear to auscultation bilaterally Abdomen: soft, nontender, nondistended, no obvious ascites, no peritoneal signs, normal bowel sounds, no organomegaly Rectal: Deferred  until colonoscopy Extremities: no clubbing, cyanosis, or lower extremity edema bilaterally Skin: no lesions on visible extremities Neuro: No focal deficits.  Cranial nerves intact  ASSESSMENT:  1.  Recurrent problems with nausea and vomiting.  Documented gastroparesis (narcotics and/or diabetes) on the background of chronic cannabis use.  Recently hospitalized with the same and dehydration.  Now doing better. 2.  Reflux esophagitis on previous endoscopies 3.  Chronic functional abdominal pain 4.  Chronic constipation exacerbated by narcotics 5.  History of multiple adenomatous colon polyps with fair preparation November 2019.  Due for surveillance with more vigorous preparation 6.  Multiple significant medical problems including insulin requiring diabetes mellitus, advanced renal disease   PLAN:  1.  Prescribe omeprazole 40 mg daily 2.  MiraLAX daily to achieve 1-2 bowel movements daily.  Accelerate the dose until this goal was achieved  3.  Schedule surveillance colonoscopy.  One half container of MiraLAX in the morning prior to the examination followed by standard split prep.  The patient is HIGH RISK given her comorbidities and the need to adjust her diabetic medications 4.  Hold  diabetic medications the day of the procedure to avoid unwanted hypoglycemia 5.  Ongoing general medical care with PCP

## 2019-10-29 ENCOUNTER — Telehealth: Payer: Self-pay | Admitting: Internal Medicine

## 2019-10-29 NOTE — Telephone Encounter (Signed)
Pt reported that she has been in bed vomiting all morning.  Please advise.

## 2019-10-29 NOTE — Telephone Encounter (Signed)
Pt states she did what Dr. Henrene Pastor told her to do over the weekend for her bowels and she did have movements. States this morning at 5am she woke up vomiting and she cannot keep anything down. Pt tearful over the phone. Explained she would need to go to the ER or urgent care if she could not keep anything down. Pt states she cannot take the ride to the ER she will get sick. Instructed her to call someone to come and take her or call 911 to come and take her. Pt stopped crying and said "all right, bye" and hung-up.

## 2019-11-05 ENCOUNTER — Other Ambulatory Visit: Payer: Self-pay | Admitting: Family Medicine

## 2019-11-05 DIAGNOSIS — E118 Type 2 diabetes mellitus with unspecified complications: Secondary | ICD-10-CM

## 2019-11-08 ENCOUNTER — Ambulatory Visit (INDEPENDENT_AMBULATORY_CARE_PROVIDER_SITE_OTHER): Payer: 59 | Admitting: Family Medicine

## 2019-11-08 ENCOUNTER — Other Ambulatory Visit: Payer: Self-pay

## 2019-11-08 ENCOUNTER — Encounter: Payer: Self-pay | Admitting: Family Medicine

## 2019-11-08 ENCOUNTER — Encounter: Payer: Self-pay | Admitting: *Deleted

## 2019-11-08 VITALS — BP 124/84 | HR 58

## 2019-11-08 DIAGNOSIS — N179 Acute kidney failure, unspecified: Secondary | ICD-10-CM | POA: Diagnosis not present

## 2019-11-08 DIAGNOSIS — Z720 Tobacco use: Secondary | ICD-10-CM

## 2019-11-08 DIAGNOSIS — E8809 Other disorders of plasma-protein metabolism, not elsewhere classified: Secondary | ICD-10-CM

## 2019-11-08 DIAGNOSIS — N184 Chronic kidney disease, stage 4 (severe): Secondary | ICD-10-CM

## 2019-11-08 DIAGNOSIS — K920 Hematemesis: Secondary | ICD-10-CM

## 2019-11-08 DIAGNOSIS — F1220 Cannabis dependence, uncomplicated: Secondary | ICD-10-CM

## 2019-11-08 DIAGNOSIS — E118 Type 2 diabetes mellitus with unspecified complications: Secondary | ICD-10-CM

## 2019-11-08 DIAGNOSIS — Z79899 Other long term (current) drug therapy: Secondary | ICD-10-CM

## 2019-11-08 DIAGNOSIS — R1115 Cyclical vomiting syndrome unrelated to migraine: Secondary | ICD-10-CM

## 2019-11-08 DIAGNOSIS — M545 Low back pain, unspecified: Secondary | ICD-10-CM

## 2019-11-08 DIAGNOSIS — M7581 Other shoulder lesions, right shoulder: Secondary | ICD-10-CM

## 2019-11-08 DIAGNOSIS — G8929 Other chronic pain: Secondary | ICD-10-CM

## 2019-11-08 DIAGNOSIS — K209 Esophagitis, unspecified without bleeding: Secondary | ICD-10-CM

## 2019-11-08 DIAGNOSIS — F1290 Cannabis use, unspecified, uncomplicated: Secondary | ICD-10-CM

## 2019-11-08 DIAGNOSIS — Z5181 Encounter for therapeutic drug level monitoring: Secondary | ICD-10-CM

## 2019-11-08 DIAGNOSIS — D649 Anemia, unspecified: Secondary | ICD-10-CM

## 2019-11-08 DIAGNOSIS — Z79891 Long term (current) use of opiate analgesic: Secondary | ICD-10-CM

## 2019-11-08 MED ORDER — DICLOFENAC SODIUM 1 % EX GEL: 4.0000 g | Freq: Four times a day (QID) | CUTANEOUS | Status: DC

## 2019-11-08 MED ORDER — OXYCODONE HCL 5 MG PO TABS: 5.0000 mg | ORAL_TABLET | Freq: Four times a day (QID) | ORAL | 0 refills | Status: DC | PRN

## 2019-11-08 NOTE — Patient Instructions (Signed)
Take Lantus 10 units every day, like you have been doing Take the Jardiance 10 mg daily Take the Omeprazole 40 mg capsule, one a day.  If you are vomiting, try to take the Omeprazole twice a day.  Take the Famotidine twice a day too.   Take the Miralax once or twice a day along with your Amitiza for you constipation.   Dr Donna Silverman wants to see you again after Thanksgiving.

## 2019-11-08 NOTE — Progress Notes (Signed)
Asked by Dr. McDiarmid to review patient's GI and DM medications. Verified patient's home medications and doses. Patient was unsure if she should take omeprazole or pantoprazole since she has prescriptions for both. Patient takes a PPI due to severe nausea/vomiting with esophageal and facial symptoms from acid contact during emesis. Instructed patient to only take Omeprazole due to a therapeutic duplication with pantoprazole. Recommended the addition of Famotidine 40 mg daily to help improve acid reflux symptoms.  Patient also reports taking Miralax, Dulcolax, and Amitiza (however patient does not seem to be taking Amitiza everyday) for constipation. Patient states that last bowel movement was ~ 1 week ago despite adequate fluid and fiber intake. Recommended the addition of Docusate 100 mg BID.  Ms. Collier reports using lantus 10 units daily at bedtime and empagliflozin 10 mg daily for diabetes. Patient will follow up with pharmacy for diabetes management after her f/u visit with Dr. McDiarmid in 2-3 week.  Patient seen with Drexel Iha, PharmD PGY-2 Resident, Sherren Kerns, PharmD PGY-1 Resident.

## 2019-11-09 ENCOUNTER — Encounter: Payer: Self-pay | Admitting: Internal Medicine

## 2019-11-10 LAB — CMP14+EGFR
ALT: 7 IU/L (ref 0–32)
AST: 9 IU/L (ref 0–40)
Albumin/Globulin Ratio: 1.9 (ref 1.2–2.2)
Albumin: 4.1 g/dL (ref 3.8–4.9)
Alkaline Phosphatase: 78 IU/L (ref 39–117)
BUN/Creatinine Ratio: 15 (ref 9–23)
BUN: 31 mg/dL — ABNORMAL HIGH (ref 6–24)
Bilirubin Total: <0.2 mg/dL (ref 0.0–1.2)
CO2: 22 mmol/L (ref 20–29)
Calcium: 9.6 mg/dL (ref 8.7–10.2)
Chloride: 104 mmol/L (ref 96–106)
Creatinine, Ser: 2.08 mg/dL — ABNORMAL HIGH (ref 0.57–1.00)
GFR calc Af Amer: 30 mL/min/{1.73_m2} — ABNORMAL LOW (ref >59)
GFR calc non Af Amer: 26 mL/min/{1.73_m2} — ABNORMAL LOW (ref >59)
Globulin, Total: 2.2 g/dL (ref 1.5–4.5)
Glucose: 122 mg/dL — ABNORMAL HIGH (ref 65–99)
Potassium: 4.6 mmol/L (ref 3.5–5.2)
Sodium: 140 mmol/L (ref 134–144)
Total Protein: 6.3 g/dL (ref 6.0–8.5)

## 2019-11-10 LAB — ANEMIA PANEL
Ferritin: 92 ng/mL (ref 15–150)
Folate, Hemolysate: 277 ng/mL
Folate, RBC: 882 ng/mL (ref >498)
Hematocrit: 31.4 % — ABNORMAL LOW (ref 34.0–46.6)
Iron Saturation: 11 % — ABNORMAL LOW (ref 15–55)
Iron: 33 ug/dL (ref 27–159)
Retic Ct Pct: 1.2 % (ref 0.6–2.6)
Total Iron Binding Capacity: 299 ug/dL (ref 250–450)
UIBC: 266 ug/dL (ref 131–425)
Vitamin B-12: 817 pg/mL (ref 232–1245)

## 2019-11-10 LAB — CBC
Hemoglobin: 10.4 g/dL — ABNORMAL LOW (ref 11.1–15.9)
MCH: 30.9 pg (ref 26.6–33.0)
MCHC: 33.1 g/dL (ref 31.5–35.7)
MCV: 93 fL (ref 79–97)
Platelets: 267 10*3/uL (ref 150–450)
RBC: 3.37 x10E6/uL — ABNORMAL LOW (ref 3.77–5.28)
RDW: 14.6 % (ref 11.7–15.4)
WBC: 7.8 10*3/uL (ref 3.4–10.8)

## 2019-11-12 ENCOUNTER — Encounter: Payer: Self-pay | Admitting: Family Medicine

## 2019-11-12 MED ORDER — DICLOFENAC SODIUM-MENTHOL 1.5 & 10 % EX THPK: 4.0000 g | PACK | Freq: Four times a day (QID) | CUTANEOUS | 5 refills | Status: DC

## 2019-11-12 NOTE — Assessment & Plan Note (Signed)
Established problem. Stable. Continue current therapy of Lantus 10 units qhs and empagliflozin 10 mg daily. Patient is to follow up with [facility] pharmacy clinic when McDiarmid sees patient in 4 weeks.

## 2019-11-12 NOTE — Assessment & Plan Note (Signed)
Resolved. Stable CKD-IV secondary DM and HTN

## 2019-11-12 NOTE — Assessment & Plan Note (Signed)
Resolved. Presumed secondary to either esophagitis from vomiting or from Mallory-Weiss tear.  EGD

## 2019-11-12 NOTE — Assessment & Plan Note (Signed)
Established problem 3-4 months S/P right shoulder arthroscopy by Dr Onnie Graham.  Improved ROM.   Using Voltaren gel rub for intermittant pain in shoulder.

## 2019-11-12 NOTE — Assessment & Plan Note (Signed)
Established problem No interest in cessation despite possible role in cyclic vomiting

## 2019-11-12 NOTE — Assessment & Plan Note (Signed)
Established problem Patient in contemplative phase of change.  Advise that we would be here to help when she decides to quit.

## 2019-11-12 NOTE — Progress Notes (Signed)
Patient ID: [name] [name], female   DOB: July 24, 1966, 53 y.o.   MRN: [id number] Follow up outpatient visit after Hospitalization  [name] [name] is alone Sources of clinical information for visit is/are patient, past medical records and DC Summary. The Discharge Summary for the hospitalization from 09/19/19 to 09/23/19 was reviewed.  Nursing assessment for this office visit was reviewed with the patient for accuracy and revision.   HPI Principle Diagnosis requiring hospitalization: Hematemesis  Brief Hospital course summary:  [name] [name] is a 53 y.o. female presented to the emergency department after having hematemesis and abdominal pain.  In the ED, patient was tachycardic and hypertensive.  EKG showed sinus tachycardia.  Chest x-ray was negative. CT abdomen/pelvis notable for intraluminal fluid density within the distal esophagus secondary to vomiting/reflux, otherwise negative. GI was consulted for suspected esophagitis in the setting of hematemesis who recommended patient have an EGD.  The next day, EGD was canceled as patient will became hypertensive, 185U systolic. Given stability of hemoglobin and resolution of hematemesis, GI opted to postpone EGD and recommended outpatient follow up with Dr. Henrene Pastor in 3-4 weeks. GI expected patient would have chronic upper abdominal pain thus she was instructed at discharge to continue Protonix 40mg  BID x 2 weeks, then QD indefinitely. She was also instructed to take Pepcid BID, Metamucil, and Amitiza as prescribed. Patient's diet was slowly advanced and by day of discharge she was tolerating a soft diet. She was eager to be discharged thus was recommended to continue to advance diet slowly and follow up closely with GI and her PCP. Return precautions were discussed at length.   Pain was controlled with IV Dilaudid until she could be transitioned to her home medication.  Patient hemoglobin down trended from 14.2 but remained stable at 9 by day of discharge  without any further episodes of hematemesis for >48 hours. Creatinine on admission was 3.25 and resolved with IV fluids prior to discharge. Her creatinine was 2.11 on day of discharge.  Patient is medically stable and appropriate for discharge with outpatient follow-up.   Follow up instructions from patient's hospital healthcare providers:   Medication changes:  ?   Please ensure patient follows up with GI on 10/26/19 as scheduled  Recommend CBC to evaluate hemoglobin at follow up visit   Continue to encourage smoking and alcohol cessation ---------------------------------------------------------------------------------------------------------------------- Problems since hospital discharge:  Onset: last week N/V Duration: 5 days Alleviating factors: partial alleviation with abortive therapy of Phenergan, ativan and Norco Aggravating Factors: food and liquids Severity or functional limitations: stayed in bed much of the time Associated symptoms: no fever, occasional loose stool Pattern: intermittent Course: improving Location: GI  Context: known dm gastoparesis and cyclic vomiting   Diabetes: Pharmacy clinic consulted on patient's glycemic control and use of Lantus and empagliflozin See their addendum for details of their consultation ---------------------------------------------------------------------------------------------------------------------- Follow up appointments with specialists:   Completed: Irene Shipper, MD Follow up on 10/26/2019.   Specialty: Gastroenterology Why: 11:20 AM follow up the GI MD Contact information: [address] [place] [address] [phone]  ---------------------------------------------------------------------------------------------------------------------- New medications or changed medications at discharge from hospitalization: ? pantoprazole 40 mg BID for 2 weeks, then once daily ? Pepcid 20mg  BID   ? Amitiza 24 mcg  daily ? Metamucil  ? TUMS as needed Chronic medications stopped during hospitalization: omeprazole Patient's Medication List was updated in the EMR: yes --------------------------------------------------------------------------------------------------------------------- Home Health Services: none Durable Medical Equipment: none ---------------------------------------------------------------------------------------------------------------------  IAD[facility] Independent Needs Assistance Dependent  Cooking x    Housework x    Manage Medications x    Manage the telephone x    Shopping for food, clothes, Meds, etc x    Use transportation x    Manage Finances x      SHx: (+) tobacco and Marijuana use  ROS: No current nausea or vomiting No fever No cough No shortness of breath . Physical Exam VS: reviewed GEN: alert, cooperative and no distress COR: Heart sounds are normal.  Regular rate and rhythm without murmur, gallop or rub. LUNG: clear to auscultation bilaterally, no increased WOB EXT: no C/C/E NEURO: alert, oriented, normal speech, no focal findings or movement disorder noted PSYCH:Good insight/prosodic and clear speech/Language concrete and relevant/affect euthymic  Comprehensive Metabolic Panel:    Component Value Date/Time   NA 140 11/08/2019 1111   K 4.6 11/08/2019 1111   CL 104 11/08/2019 1111   CL 105 02/26/2019   CO2 22 11/08/2019 1111   CO2 22 02/26/2019   BUN 31 (H) 11/08/2019 1111   CREATININE 2.08 (H) 11/08/2019 1111   CREATININE 2.26 (H) 08/26/2016 1109   GLUCOSE 122 (H) 11/08/2019 1111   GLUCOSE 134 (H) 09/22/2019 0418   CALCIUM 9.6 11/08/2019 1111   CALCIUM 9.7 02/26/2019   AST 9 11/08/2019 1111   ALT 7 11/08/2019 1111   ALKPHOS 78 11/08/2019 1111   BILITOT <0.2 11/08/2019 1111   PROT 6.3 11/08/2019 1111   ALBUMIN 4.1 11/08/2019 1111   ALBUMIN 4.4 02/26/2019   CBC:    Component Value Date/Time   WBC 7.8 11/08/2019 1111   WBC 5.2 09/23/2019 0337    HGB 10.4 (L) 11/08/2019 1111   HCT 31.4 (L) 11/08/2019 1111   PLT 267 11/08/2019 1111   MCV 93 11/08/2019 1111   NEUTROABS 2.9 07/16/2019 1132   LYMPHSABS 2.6 07/16/2019 1132   MONOABS 0.4 03/01/2019 1112   EOSABS 0.1 07/16/2019 1132   BASOSABS 0.0 07/16/2019 1132    Lab Results  Component Value Date   HGBA1C 7.4 (H) 09/20/2019

## 2019-11-12 NOTE — Assessment & Plan Note (Signed)
Established problem Advised abstinence given proposed role of marijuana use with persistent vomiting.

## 2019-11-12 NOTE — Assessment & Plan Note (Signed)
Established problem. Stable. Periodic monitoring in future as anemia due to her CKD-IV

## 2019-11-12 NOTE — Assessment & Plan Note (Signed)
Established problem. Stable. Continue current therapy  

## 2019-11-12 NOTE — Assessment & Plan Note (Addendum)
Established problem Stable but punctuated intermittently with nausea and vomiting episodes that sometimes respond to the abortive oral combination therapy (Norco, Ativan, and Phenergan) and sometimes not.  Her exacerbation last week did not respond fully to her abortive therapy,though it was enough adequacy of relief to allow her to avoid ED visit.   Patient is tolerating this combination therapy without excess sedation nor falls. She has chronic constipation.  Will Continue refilling agents of the combination therapy.  Also, omeprazole 40 mg capsule daily, increase to two capsues daily when vomiting per Dr Henrene Pastor (GI) recommendation.  No evidence on PDMP of aberrant behaviors. Norco was refilled today.

## 2019-11-15 ENCOUNTER — Telehealth: Payer: Self-pay | Admitting: Internal Medicine

## 2019-11-15 NOTE — Telephone Encounter (Signed)
Pt aware.

## 2019-11-15 NOTE — Telephone Encounter (Signed)
Her narcotics are the problem.  She needs to take MiraLAX.  Mix it in something palatable.  Can take multiple doses throughout the day.  She needs to be on this regularly to have bowel movements

## 2019-11-15 NOTE — Telephone Encounter (Signed)
Pt calling very tearful again stating that she has not had a BM since the weekend. States she did an enema and only a few "pinto bean sized pieces" came out. She states she cannot take the miralax very well because she throws it back up. Dr. McDiarmid saw her last week and she was told to take the miralax, amitiza BID and add Docusate 100mg  BID. She has not been taking the Docusate, she states she will try this and call back if this does not help. Dr. Henrene Pastor aware.

## 2019-11-16 ENCOUNTER — Ambulatory Visit (INDEPENDENT_AMBULATORY_CARE_PROVIDER_SITE_OTHER): Payer: 59

## 2019-11-16 ENCOUNTER — Telehealth: Payer: Self-pay | Admitting: Internal Medicine

## 2019-11-16 ENCOUNTER — Other Ambulatory Visit: Payer: Self-pay

## 2019-11-16 ENCOUNTER — Other Ambulatory Visit: Payer: Self-pay | Admitting: Internal Medicine

## 2019-11-16 DIAGNOSIS — Z1159 Encounter for screening for other viral diseases: Secondary | ICD-10-CM

## 2019-11-16 NOTE — Telephone Encounter (Signed)
Spoke with the patient. She reports "good bowel movements past 2 days and today." She woke up sick on her stomach today and has vomited. She states she has "cyclic vomiting syndrome" and has medications available for this. She is concerned about starting the prep and possibly vomiting it back. Patient has promethazine and lorazepam for her vomiting. Suggested she take promethazine 30 minutes to 1 hour before she starts her prep.

## 2019-11-16 NOTE — Telephone Encounter (Signed)
I agree

## 2019-11-19 LAB — SARS CORONAVIRUS 2 (TAT 6-24 HRS): SARS Coronavirus 2: NEGATIVE

## 2019-11-20 ENCOUNTER — Encounter: Payer: Self-pay | Admitting: Internal Medicine

## 2019-11-20 ENCOUNTER — Other Ambulatory Visit: Payer: Self-pay

## 2019-11-20 ENCOUNTER — Telehealth: Payer: Self-pay | Admitting: Internal Medicine

## 2019-11-20 ENCOUNTER — Ambulatory Visit (AMBULATORY_SURGERY_CENTER): Payer: 59 | Admitting: Internal Medicine

## 2019-11-20 VITALS — BP 145/74 | HR 56 | Temp 98.5°F | Resp 14 | Ht 62.0 in | Wt 151.0 lb

## 2019-11-20 DIAGNOSIS — Z1211 Encounter for screening for malignant neoplasm of colon: Secondary | ICD-10-CM | POA: Diagnosis not present

## 2019-11-20 DIAGNOSIS — K5909 Other constipation: Secondary | ICD-10-CM

## 2019-11-20 DIAGNOSIS — D120 Benign neoplasm of cecum: Secondary | ICD-10-CM | POA: Diagnosis not present

## 2019-11-20 DIAGNOSIS — D122 Benign neoplasm of ascending colon: Secondary | ICD-10-CM

## 2019-11-20 DIAGNOSIS — R1084 Generalized abdominal pain: Secondary | ICD-10-CM

## 2019-11-20 MED ORDER — SODIUM CHLORIDE 0.9 % IV SOLN: 500.0000 mL | Freq: Once | INTRAVENOUS | Status: DC

## 2019-11-20 NOTE — Progress Notes (Signed)
Patient BG 60 on admission, Started IV NS at [facility], up D5W at [facility] at 1445, BG recheck at 1500 is 45, D5W rate increased to wide open at this time, BG recheck at 1515 is 82. Dr. Henrene Pastor and CRNA notified and have taken patient to procedure room.

## 2019-11-20 NOTE — Op Note (Signed)
[place] Patient Name: [name] [name] Procedure Date: 11/20/2019 3:24 PM MRN: [id number] Endoscopist: Docia Chuck. Henrene Pastor , MD Age: 53 Referring MD:  Date of Birth: 06-Feb-1966 Gender: Female Account #: [id number] Procedure:                Colonoscopy with cold snare polypectomy x 5 Indications:              High risk colon cancer surveillance: Personal                            history of multiple (3 or more) adenomas. Previous                            examination November 2019. 7 polyps. Fair prep. Now                            back for follow-up with more extensive prep. Medicines:                Monitored Anesthesia Care Procedure:                Pre-Anesthesia Assessment:                           - Prior to the procedure, a History and Physical                            was performed, and patient medications and                            allergies were reviewed. The patient's tolerance of                            previous anesthesia was also reviewed. The risks                            and benefits of the procedure and the sedation                            options and risks were discussed with the patient.                            All questions were answered, and informed consent                            was obtained. Prior Anticoagulants: The patient has                            taken no previous anticoagulant or antiplatelet                            agents. ASA Grade Assessment: II - A patient with                            mild systemic disease. After reviewing the risks  and benefits, the patient was deemed in                            satisfactory condition to undergo the procedure.                           After obtaining informed consent, the colonoscope                            was passed under direct vision. Throughout the                            procedure, the patient's blood pressure, pulse, and                oxygen saturations were monitored continuously. The                            Colonoscope was introduced through the anus and                            advanced to the the cecum, identified by                            appendiceal orifice and ileocecal valve. The                            ileocecal valve, appendiceal orifice, and rectum                            were photographed. The quality of the bowel                            preparation was good. The colonoscopy was performed                            without difficulty. The patient tolerated the                            procedure well. The bowel preparation used was                            extensive plus SUPREP via split dose instruction. Scope In: 3:37:20 PM Scope Out: 3:58:56 PM Scope Withdrawal Time: 0 hours 11 minutes 21 seconds  Total Procedure Duration: 0 hours 21 minutes 36 seconds  Findings:                 Five polyps were found in the ascending colon and                            cecum. The polyps were 1 to 3 mm in size. These                            polyps were removed with a cold snare. Resection  and retrieval were complete.                           The exam was otherwise without abnormality on                            direct and retroflexion views. Complications:            No immediate complications. Estimated blood loss:                            None. Estimated Blood Loss:     Estimated blood loss: none. Impression:               - Five 1 to 3 mm polyps in the ascending colon and                            in the cecum, removed with a cold snare. Resected                            and retrieved.                           - The examination was otherwise normal on direct                            and retroflexion views. Recommendation:           - Repeat colonoscopy in 3 years for surveillance.                            EXTENSIVE PREP                            - Patient has a contact number available for                            emergencies. The signs and symptoms of potential                            delayed complications were discussed with the                            patient. Return to normal activities tomorrow.                            Written discharge instructions were provided to the                            patient.                           - Resume previous diet.                           - Continue present medications.                           -  Await pathology results. Docia Chuck. Henrene Pastor, MD 11/20/2019 4:04:36 PM This report has been signed electronically.

## 2019-11-20 NOTE — Progress Notes (Signed)
Temperature taken by J.B., VS taken by C.W. 

## 2019-11-20 NOTE — Progress Notes (Signed)
Called to room to assist during endoscopic procedure.  Patient ID and intended procedure confirmed with present staff. Received instructions for my participation in the procedure from the performing physician.  

## 2019-11-20 NOTE — Telephone Encounter (Signed)
Returned pts call.  She is scheduled for 330pm today and states her blood sugar is 50.  She is drinking sprite right now and will retest in 15 minutes. She will call back if she has any further questions.

## 2019-11-20 NOTE — Progress Notes (Signed)
A/ox3, pleased with MAC, report to RN 

## 2019-11-20 NOTE — Patient Instructions (Addendum)
Handouts given:  Polyps Resume previous diet Check your blood sugar more closely tonight Continue with present medications Repeat colonoscopy in 3 years Expect pathology report in  2 weeks   YOU HAD AN ENDOSCOPIC PROCEDURE TODAY AT [place]:   Refer to the procedure report that was given to you for any specific questions about what was found during the examination.  If the procedure report does not answer your questions, please call your gastroenterologist to clarify.  If you requested that your care partner not be given the details of your procedure findings, then the procedure report has been included in a sealed envelope for you to review at your convenience later.  YOU SHOULD EXPECT: Some feelings of bloating in the abdomen. Passage of more gas than usual.  Walking can help get rid of the air that was put into your GI tract during the procedure and reduce the bloating. If you had a lower endoscopy (such as a colonoscopy or flexible sigmoidoscopy) you may notice spotting of blood in your stool or on the toilet paper. If you underwent a bowel prep for your procedure, you may not have a normal bowel movement for a few days.  Please Note:  You might notice some irritation and congestion in your nose or some drainage.  This is from the oxygen used during your procedure.  There is no need for concern and it should clear up in a day or so.  SYMPTOMS TO REPORT IMMEDIATELY:   Following lower endoscopy (colonoscopy or flexible sigmoidoscopy):  Excessive amounts of blood in the stool  Significant tenderness or worsening of abdominal pains  Swelling of the abdomen that is new, acute  Fever of 100F or higher   Black, tarry-looking stools  For urgent or emergent issues, a gastroenterologist can be reached at any hour by calling [phone].   DIET:  We do recommend a small meal at first, but then you may proceed to your regular diet.  Drink plenty of fluids but you should  avoid alcoholic beverages for 24 hours.  ACTIVITY:  You should plan to take it easy for the rest of today and you should NOT DRIVE or use heavy machinery until tomorrow (because of the sedation medicines used during the test).    FOLLOW UP: Our staff will call the number listed on your records 48-72 hours following your procedure to check on you and address any questions or concerns that you may have regarding the information given to you following your procedure. If we do not reach you, we will leave a message.  We will attempt to reach you two times.  During this call, we will ask if you have developed any symptoms of COVID 19. If you develop any symptoms (ie: fever, flu-like symptoms, shortness of breath, cough etc.) before then, please call [phone].  If you test positive for Covid 19 in the 2 weeks post procedure, please call and report this information to [place].    If any biopsies were taken you will be contacted by phone or by letter within the next 1-3 weeks.  Please call us at [phone] if you have not heard about the biopsies in 3 weeks.    SIGNATURES/CONFIDENTIALITY: You and/or your care partner have signed paperwork which will be entered into your electronic medical record.  These signatures attest to the fact that that the information above on your After Visit Summary has been reviewed and is understood.  Full responsibility of the confidentiality of  this discharge information lies with you and/or your care-partner.

## 2019-11-26 ENCOUNTER — Telehealth: Payer: Self-pay

## 2019-11-26 NOTE — Telephone Encounter (Signed)
  Follow up Call-  Call back number 11/20/2019 11/14/2018  Post procedure Call Back phone  # [phone]  Permission to leave phone message Yes Yes  Some recent data might be hidden     Patient questions:  Do you have a fever, pain , or abdominal swelling? No. Pain Score  0 *  Have you tolerated food without any problems? Yes.    Have you been able to return to your normal activities? Yes.    Do you have any questions about your discharge instructions: Diet   No. Medications  No. Follow up visit  No.  Do you have questions or concerns about your Care? No.  Actions: * If pain score is 4 or above: No action needed, pain <4.  1. Have you developed a fever since your procedure? no  2.   Have you had an respiratory symptoms (SOB or cough) since your procedure? no  3.   Have you tested positive for COVID 19 since your procedure no  4.   Have you had any family members/close contacts diagnosed with the COVID 19 since your procedure?  no   If yes to any of these questions please route to Joylene John, RN and Alphonsa Gin, Therapist, sports.

## 2019-11-27 ENCOUNTER — Encounter: Payer: Self-pay | Admitting: Internal Medicine

## 2019-12-02 ENCOUNTER — Other Ambulatory Visit: Payer: Self-pay | Admitting: Family Medicine

## 2019-12-03 ENCOUNTER — Encounter: Payer: Self-pay | Admitting: Family Medicine

## 2019-12-03 DIAGNOSIS — D126 Benign neoplasm of colon, unspecified: Secondary | ICD-10-CM | POA: Insufficient documentation

## 2019-12-03 HISTORY — DX: Benign neoplasm of colon, unspecified: D12.6

## 2019-12-06 ENCOUNTER — Other Ambulatory Visit: Payer: Self-pay

## 2019-12-06 ENCOUNTER — Ambulatory Visit (INDEPENDENT_AMBULATORY_CARE_PROVIDER_SITE_OTHER): Payer: 59 | Admitting: Family Medicine

## 2019-12-06 ENCOUNTER — Encounter: Payer: Self-pay | Admitting: Family Medicine

## 2019-12-06 VITALS — BP 120/68 | HR 74

## 2019-12-06 DIAGNOSIS — R1115 Cyclical vomiting syndrome unrelated to migraine: Secondary | ICD-10-CM

## 2019-12-06 DIAGNOSIS — E118 Type 2 diabetes mellitus with unspecified complications: Secondary | ICD-10-CM

## 2019-12-06 DIAGNOSIS — N184 Chronic kidney disease, stage 4 (severe): Secondary | ICD-10-CM

## 2019-12-06 DIAGNOSIS — E7800 Pure hypercholesterolemia, unspecified: Secondary | ICD-10-CM

## 2019-12-06 DIAGNOSIS — K5903 Drug induced constipation: Secondary | ICD-10-CM | POA: Diagnosis not present

## 2019-12-06 DIAGNOSIS — Z79891 Long term (current) use of opiate analgesic: Secondary | ICD-10-CM

## 2019-12-06 DIAGNOSIS — Z5181 Encounter for therapeutic drug level monitoring: Secondary | ICD-10-CM

## 2019-12-06 DIAGNOSIS — K2100 Gastro-esophageal reflux disease with esophagitis, without bleeding: Secondary | ICD-10-CM

## 2019-12-06 DIAGNOSIS — Z720 Tobacco use: Secondary | ICD-10-CM

## 2019-12-06 LAB — POCT GLYCOSYLATED HEMOGLOBIN (HGB A1C): HbA1c, POC (controlled diabetic range): 8.6 % — AB (ref 0.0–7.0)

## 2019-12-06 MED ORDER — PROMETHAZINE HCL 25 MG RE SUPP: 25.0000 mg | Freq: Four times a day (QID) | RECTAL | 3 refills | Status: DC | PRN

## 2019-12-06 MED ORDER — OXYCODONE HCL 5 MG PO TABS: 5.0000 mg | ORAL_TABLET | Freq: Four times a day (QID) | ORAL | 0 refills | Status: AC | PRN

## 2019-12-06 MED ORDER — SENNA 8.6 MG PO TABS: 2.0000 | ORAL_TABLET | Freq: Every day | ORAL | 99 refills | Status: DC | PRN

## 2019-12-06 NOTE — Patient Instructions (Addendum)
If after your increase dose of Jardiance, your blood sugars remain in the 200s, you can slowly increase your Lantus by 1 or 2 units until your blood sugars remain below 180.   A prescription was sent in for your oxycodone and phenergan suppositories.    A prescription was sent in for Senokot to help stimulate your bowels to have a bowel movement.

## 2019-12-10 ENCOUNTER — Encounter: Payer: Self-pay | Admitting: Family Medicine

## 2019-12-10 MED ORDER — OXYCODONE HCL 5 MG PO TABS: 5.0000 mg | ORAL_TABLET | Freq: Four times a day (QID) | ORAL | 0 refills | Status: DC | PRN

## 2019-12-10 MED ORDER — OXYCODONE HCL 5 MG PO TABS: 5.0000 mg | ORAL_TABLET | Freq: Four times a day (QID) | ORAL | 0 refills | Status: AC | PRN

## 2019-12-10 MED ORDER — EMPAGLIFLOZIN 25 MG PO TABS: 25.0000 mg | ORAL_TABLET | Freq: Every day | ORAL | 3 refills | Status: DC

## 2019-12-10 NOTE — Assessment & Plan Note (Signed)
Need to get ROI next ov for last op consultations with Dr Joelyn Oms ([facility])

## 2019-12-10 NOTE — Assessment & Plan Note (Signed)
Established problem Adequate analgesia. No adverse effects. Able to fully or partially abort cyclic vomiting attacks with use of oxycodone and ativan. No aberrant behaviors - [place] CSRS checked.   Continue current therapy regiment. 3 monthly prescriptions oxycodone 5 mg tab #60 per month sent online to pharmacy.

## 2019-12-10 NOTE — Assessment & Plan Note (Signed)
Established problem Uncontrolled Lab Results  Component Value Date   HGBA1C 8.6 (A) 12/06/2019  Patient evaluated F2F by Dr [name] during ov.  Plan to increase Empagliflozin to 25 mg daily. Continue Lantus 10 units in morning and titrate up or down to keep CBGs below 180 without episodes of hypoglycemia.   RTC 1 month.

## 2019-12-10 NOTE — Assessment & Plan Note (Signed)
Need to readdress next visit.  Would Medicaid cover Chantix started prior to desire to quit?

## 2019-12-10 NOTE — Assessment & Plan Note (Signed)
Need to restart atorvastatin once a week, #12 tablet, RF 3

## 2019-12-10 NOTE — Progress Notes (Signed)
[name] [name] is alone Sources of clinical information for visit is/are patient, past medical records and discussed with clinic pharmacist, Dr Janeann Forehand PharmD. Dr [name] had F2F with patient during this ov. Nursing assessment for this office visit was reviewed with the patient for accuracy and revision.   Previous Report(s) Reviewed: no  Adult vaccines due  Topic Date Due  . TETANUS/TDAP  09/29/2023  . PNEUMOCOCCAL POLYSACCHARIDE VACCINE AGE 41-64 HIGH RISK  Completed    Health Maintenance Due  Topic Date Due  . OPHTHALMOLOGY EXAM  07/08/2017       Is pregnancy a possibility?: no No LMP recorded. Patient has had a hysterectomy.   Is patient Breastfeeding?  no    History/P.E. limitations: none  Adult vaccines due  Topic Date Due  . TETANUS/TDAP  09/29/2023  . PNEUMOCOCCAL POLYSACCHARIDE VACCINE AGE 41-64 HIGH RISK  Completed    Diabetes Health Maintenance Due  Topic Date Due  . OPHTHALMOLOGY EXAM  07/08/2017    Health Maintenance Due  Topic Date Due  . OPHTHALMOLOGY EXAM  07/08/2017     Chief Complaint  Patient presents with  . Diabetes    CHRONIC DIABETES  Disease Monitoring  Blood Sugar Ranges: mostly high 100 to lower 200s, fasting  Polyuria: no   Visual problems: no  Wt Readings from Last 3 Encounters:  11/20/19 151 lb (68.5 kg)  10/26/19 151 lb 2 oz (68.5 kg)  10/05/19 145 lb (65.8 kg)   Recent Medication Changes:  yes, lantus 10 units each morning and empagliflozin 10 mg daily.    Medication Regiment Adherence: yes                                                        no know adherence challenges Medication Side Effects  Hypoglycemia: no   Diet Pattern: poor meal pattern  Recent Physical Illness: no   Emotional Stressors: Death anniversay of son's violent death last year  Level of Activity: Household maintenance; Sedentary  Preventitive Health Care                          Taking Prescribed Statin: no, not yet prescribed once a week  atorvastatin             Taking Prescribed ACEI/ARB: no, CKD 4 - manged by Nephrology                          Last Eye Exam: 9379  Cyclic vomiting - Duration: many years - Management: abortive therapy with oxycodone/phenergan/ativan - Severity: no exacerbations since last ov - Associated Symptoms: high blood pressure readings (+) during attacks, back pain with attacks (aura)

## 2020-01-31 ENCOUNTER — Other Ambulatory Visit: Payer: Self-pay

## 2020-01-31 ENCOUNTER — Telehealth (INDEPENDENT_AMBULATORY_CARE_PROVIDER_SITE_OTHER): Payer: 59 | Admitting: Family Medicine

## 2020-01-31 ENCOUNTER — Encounter: Payer: Self-pay | Admitting: Family Medicine

## 2020-01-31 DIAGNOSIS — E118 Type 2 diabetes mellitus with unspecified complications: Secondary | ICD-10-CM

## 2020-01-31 DIAGNOSIS — K3184 Gastroparesis: Secondary | ICD-10-CM

## 2020-01-31 DIAGNOSIS — J029 Acute pharyngitis, unspecified: Secondary | ICD-10-CM | POA: Diagnosis not present

## 2020-01-31 DIAGNOSIS — E1143 Type 2 diabetes mellitus with diabetic autonomic (poly)neuropathy: Secondary | ICD-10-CM | POA: Diagnosis not present

## 2020-01-31 DIAGNOSIS — R1115 Cyclical vomiting syndrome unrelated to migraine: Secondary | ICD-10-CM

## 2020-01-31 DIAGNOSIS — E7800 Pure hypercholesterolemia, unspecified: Secondary | ICD-10-CM

## 2020-01-31 MED ORDER — LORAZEPAM 1 MG PO TABS: 1.0000 mg | ORAL_TABLET | Freq: Two times a day (BID) | ORAL | 0 refills | Status: DC | PRN

## 2020-01-31 MED ORDER — PROMETHAZINE HCL 12.5 MG PO TABS: ORAL_TABLET | ORAL | 3 refills | Status: DC

## 2020-02-01 DIAGNOSIS — J029 Acute pharyngitis, unspecified: Secondary | ICD-10-CM | POA: Insufficient documentation

## 2020-02-01 NOTE — Assessment & Plan Note (Addendum)
Established problem. Stable. Continue current prn therapy Great deal of anxiety around possible covid infection.  Rx 5 additional Ativan tablets given significant possibility of exac of cyclic vomiting with this emotional stress. Rx refill phenergan tablets prn.

## 2020-02-01 NOTE — Assessment & Plan Note (Addendum)
Need to restart atorvastatin once a week #12 RF 3 once CBG back under control

## 2020-02-01 NOTE — Assessment & Plan Note (Signed)
New problem Exposure to dgt with covid infection 6-7 days ago Developed sore throat, cough 5 days ago Recommend going for Covid virus testing. Discussed natural history, complications, symptomatic care, self-monitoring, and self quarantine/isolation

## 2020-02-01 NOTE — Progress Notes (Signed)
[place] / E- Visit   This visit type was conducted due to national recommendations for restrictions regarding the COVID-19 Pandemic (e.g. social distancing) in an effort to limit this patient's exposure and mitigate transmission in our community.   Due to their co-morbid illnesses, this patient is at least at moderate risk for complications without adequate follow up: yes   This format is felt to be most appropriate for this patient at this time.  All issues noted in this document were discussed and addressed.  A limited physical exam was performed with this format.   Patient's identity was confirmed using her Full Name and DOB.  We discussed the limitations of evaluation and management by telemedicine and the availability of in person appointments.  We discussed the limitations in assessment, security and privacy with performing an evaluation and management service by telephone and the availability of in person appointments.  We discussed with the patient that there may be a patient responsible charge related to this service.  The patient expressed understanding and agreed to proceed.   Encounter participants: Patient: [name] [name]  Patient Location: Home Provider: Sherren Mocha Annisten [place] at office  Others (if applicable):    ====================================================================================================================================================================================================================================================================================================   Sore throat New problem Exposure to dgt with covid infection 6-7 days ago Developed sore throat, cough 5 days ago Recommend going for Covid virus testing. Discussed natural history, complications, symptomatic care, self-monitoring, and self quarantine/isolation  Cyclic vomiting syndrome Established problem. Stable. Continue current prn  therapy Great deal of anxiety around possible covid infection.  Rx 5 additional Ativan tablets given significant possibility of exac of cyclic vomiting with this emotional stress. Rx refill phenergan tablets prn.     DM (diabetes mellitus), type 2 with complications (HCC) Established problem Taking Lantus 15 units qam and Empagliflozin 20 mg daily. She will increase to 25 mg once her supply of 10 mg tablets runs out towards end of month.  Running fCBGs around 200. Recommend continue Jardiance 20 then 25 mg daily and increase Lantus to 18 units daily.  Goal is fCBG less than 180 mg/dL.   Pure hypercholesterolemia Need to restart atorvastatin once a week #12 RF 3 once CBG back under control     Follow Up Instructions:     We discussed the assessment and treatment plan with the patient. The patient was provided an opportunity to ask questions and all were answered. The patient agreed with the plan and demonstrated an understanding of the instructions.   The patient was advised to call back or seek an in-person evaluation if the symptoms worsen or if the condition fails to improve as anticipated.  COVID-19 Education: The signs and symptoms of COVID-19 were discussed with the patient and how to seek care for testing (follow up with PCP or arrange E-visit).  The importance of social distancing was discussed today.  Time spent on phone with patient: 12 minutes

## 2020-02-01 NOTE — Assessment & Plan Note (Signed)
Established problem Taking Lantus 15 units qam and Empagliflozin 20 mg daily. She will increase to 25 mg once her supply of 10 mg tablets runs out towards end of month.  Running fCBGs around 200. Recommend continue Jardiance 20 then 25 mg daily and increase Lantus to 18 units daily.  Goal is fCBG less than 180 mg/dL.

## 2020-02-04 ENCOUNTER — Other Ambulatory Visit: Payer: Self-pay | Admitting: Family Medicine

## 2020-02-04 NOTE — Addendum Note (Signed)
Addended byWendy Poet, Shavon Zenz D on: 02/04/2020 10:53 AM   Modules accepted: Level of Service

## 2020-02-07 ENCOUNTER — Ambulatory Visit: Payer: 59 | Admitting: Family Medicine

## 2020-02-21 ENCOUNTER — Other Ambulatory Visit: Payer: Self-pay

## 2020-02-21 ENCOUNTER — Encounter: Payer: Self-pay | Admitting: Family Medicine

## 2020-02-21 ENCOUNTER — Ambulatory Visit (INDEPENDENT_AMBULATORY_CARE_PROVIDER_SITE_OTHER): Payer: 59 | Admitting: Family Medicine

## 2020-02-21 VITALS — BP 124/70 | HR 57 | Wt 155.0 lb

## 2020-02-21 DIAGNOSIS — E118 Type 2 diabetes mellitus with unspecified complications: Secondary | ICD-10-CM

## 2020-02-21 DIAGNOSIS — E7800 Pure hypercholesterolemia, unspecified: Secondary | ICD-10-CM | POA: Diagnosis not present

## 2020-02-21 DIAGNOSIS — R1115 Cyclical vomiting syndrome unrelated to migraine: Secondary | ICD-10-CM | POA: Diagnosis not present

## 2020-02-21 LAB — POCT GLYCOSYLATED HEMOGLOBIN (HGB A1C): HbA1c, POC (controlled diabetic range): 7.2 % — AB (ref 0.0–7.0)

## 2020-02-21 MED ORDER — EZETIMIBE 10 MG PO TABS: 10.0000 mg | ORAL_TABLET | Freq: Every day | ORAL | 3 refills | Status: DC

## 2020-02-21 NOTE — Patient Instructions (Signed)
Your A1c is 7.2%, down from8.6 % last year.  Great Job! Keep taking th diabetes medications like you are.   Please try a medication to lower you risk of heart attacks and strokes, called Zetia (ezetimibe).  Take one pill daily.   I agree that after the 65 and above people get there vaccinations, you should get your CoViD vaccination.

## 2020-02-22 NOTE — Progress Notes (Addendum)
CPT E&M Office Visit Time Before Visit; reviewing medical records (e.g. recent visits, labs, studies): 5 minutes During Visit (F2F time): 15 minutes After Visit (discussion with family or HCP, prescribing, ordering, referring, calling result/recommendations or documenting on same day): 10 minutes Total Visit Time: 30 minutes   Diabetic foot exam was performed with the following findings:   No deformities, ulcerations, or other skin breakdown Normal sensation of 10g monofilament Intact posterior tibialis and dorsalis pedis pulses

## 2020-02-22 NOTE — Assessment & Plan Note (Signed)
Established problem that has improved.  Lab Results  Component Value Date   HGBA1C 7.2 (A) 02/21/2020  Taking 18 units Lantus, 20 units empagliflozin daily fCBGs < 180, no hypoglycemic events.  Plan: Increase empagliflozin to 25 mg tablet daily once 10 mg tab run out.  Continue Lantus 18 units daily RTC 3 months

## 2020-02-22 NOTE — Assessment & Plan Note (Signed)
Established problem [name] [name] does not want to start statin, even once a week, for fear it will upset her cyclic vomiting. She is willing to start Ezetimibe.  Rx Zetia 10 mg daily.

## 2020-02-22 NOTE — Assessment & Plan Note (Signed)
Established problem. Stable. Will continue current abortive therapy with oxycodone and lorazepam  Ms Disch's oxycodone prescription fills per PDMP is c/w her using fewer oxydone per month than prescribed.  RX written                  Rx filled 12/06/19                     12/06/19 12/10/19                      01/05/20 12/10/19                      02/04/19  01/05/20                          Not yet filled 01/05/20                          Not yet filled  There had been an increase of number of oxycodone dispensed from usual #30 tab/month 03/2018 for [facility] abort[facility] to #45 tabs/month starting 08/2018 (addition of right rotary cuff tear pain to [facility] abort[facility]) to #60 tabs/month 05/2019 (again for uncontrolled right rotary athropathy and [facility] abort[facility]).  We discussed that it is time to begin tapering back down the number of oxycodone back to #30/month.  This taper will begin with the first oxycodone refill request at least one month after the second 01/05/20 prescription is filled per PDMP reviews.  Will plan on reduction of #10 tablets oxycodone per refill until back to #30/month solely for [facility] abort[facility] therapy.

## 2020-03-05 ENCOUNTER — Telehealth: Payer: Self-pay | Admitting: *Deleted

## 2020-03-05 DIAGNOSIS — R1115 Cyclical vomiting syndrome unrelated to migraine: Secondary | ICD-10-CM

## 2020-03-05 NOTE — Telephone Encounter (Signed)
Pt is requesting a refill on her oxycodone.  Not an active med on her list.  Will forward to PCP. Christen Bame, CMA

## 2020-03-06 MED ORDER — OXYCODONE HCL 5 MG PO TABS: 5.0000 mg | ORAL_TABLET | Freq: Four times a day (QID) | ORAL | 0 refills | Status: DC | PRN

## 2020-03-06 NOTE — Telephone Encounter (Signed)
[name] - Thank you for looking into this.  I sent in an Rx for her oxycodone. [name] [name]

## 2020-03-06 NOTE — Addendum Note (Signed)
Addended byWendy Poet, Tamura Lasky D on: 03/06/2020 02:59 PM   Modules accepted: Orders

## 2020-03-06 NOTE — Telephone Encounter (Signed)
Please let Ms Schwenke know that there are two oxycodone prescriptions from 01/05/20 sent to her pharamacy that have not yet been filled.    We will discuss further oxycodone prescriptions after these two oxycodone prescriptions that were sent into her pharmacy on 01/05/20 are filled.

## 2020-03-06 NOTE — Telephone Encounter (Signed)
I spoke with [name] [name] at [facility] and they don't have a script on file from 01-05-20.  She the last oxy script they received was from 12-10-19 and nothing for this year.  Patient also said she was told there wasn't anything on file when she called them too.  Will forward to MD. [name]

## 2020-04-03 ENCOUNTER — Telehealth: Payer: Self-pay

## 2020-04-03 DIAGNOSIS — R1115 Cyclical vomiting syndrome unrelated to migraine: Secondary | ICD-10-CM

## 2020-04-03 MED ORDER — LORAZEPAM 1 MG PO TABS: 1.0000 mg | ORAL_TABLET | Freq: Two times a day (BID) | ORAL | 0 refills | Status: DC | PRN

## 2020-04-03 NOTE — Telephone Encounter (Signed)
Patient calls nurse line stating she had NV for the last two days. Patient stated it all started when she took flexeril 10mg  given to her by her surgeon. Patient stated she is out of Ativan and this is the only medication that gives her any relief. Patient stated she last filled Ativan on 3/11, therefore can not pick up until 4/11. Patient states she is completely out and needs PCP to override the pick up date. Ed precautions given for dehydration. Patient encouraged to stay hydrated. Please advise.

## 2020-04-03 NOTE — Telephone Encounter (Signed)
Please let [name] [name] know that a one-time prescription of 10 Ativan tablets were sent to pharmacy.  She will very likely have to pay out of pocket for this prescription.

## 2020-04-03 NOTE — Telephone Encounter (Signed)
Spoke with pt informed her that her Rx was sent to her pharm, and that she may have to pay out of pocket for this Rx.. Pt understood and said that was fine. Salvatore Marvel, CMA

## 2020-04-06 ENCOUNTER — Other Ambulatory Visit: Payer: Self-pay | Admitting: Family Medicine

## 2020-04-06 DIAGNOSIS — E118 Type 2 diabetes mellitus with unspecified complications: Secondary | ICD-10-CM

## 2020-04-06 DIAGNOSIS — E1142 Type 2 diabetes mellitus with diabetic polyneuropathy: Secondary | ICD-10-CM

## 2020-04-06 DIAGNOSIS — E1143 Type 2 diabetes mellitus with diabetic autonomic (poly)neuropathy: Secondary | ICD-10-CM

## 2020-04-06 DIAGNOSIS — R1115 Cyclical vomiting syndrome unrelated to migraine: Secondary | ICD-10-CM

## 2020-04-08 ENCOUNTER — Telehealth: Payer: Self-pay

## 2020-04-08 DIAGNOSIS — R1115 Cyclical vomiting syndrome unrelated to migraine: Secondary | ICD-10-CM

## 2020-04-08 MED ORDER — OXYCODONE HCL 5 MG PO TABS: 5.0000 mg | ORAL_TABLET | Freq: Four times a day (QID) | ORAL | 0 refills | Status: DC | PRN

## 2020-04-08 NOTE — Telephone Encounter (Signed)
LM for patient ok per dpr that medication was sent to pharmacy. Sarh Kirschenbaum,CMA

## 2020-04-08 NOTE — Telephone Encounter (Signed)
Please let Ms Schooler know 50 tablets of oxycodone were went into pharmacy.  This is a month's supply.

## 2020-04-08 NOTE — Telephone Encounter (Signed)
Patient calling nurse line requesting rx for oxycodone. This is not on patient's current medication list. Patient states that she is unable to take tylenol or ibuprofen for pain and that she has chronic back pain and cyclic vomiting syndrome and needs something for her pain.   To PCP  Please advise  Talbot Grumbling, RN

## 2020-04-10 ENCOUNTER — Other Ambulatory Visit: Payer: Self-pay

## 2020-04-10 ENCOUNTER — Encounter: Payer: Self-pay | Admitting: Family Medicine

## 2020-04-10 ENCOUNTER — Ambulatory Visit (INDEPENDENT_AMBULATORY_CARE_PROVIDER_SITE_OTHER): Payer: 59 | Admitting: Family Medicine

## 2020-04-10 VITALS — BP 124/60 | HR 53 | Wt 147.0 lb

## 2020-04-10 DIAGNOSIS — K3184 Gastroparesis: Secondary | ICD-10-CM

## 2020-04-10 DIAGNOSIS — E1143 Type 2 diabetes mellitus with diabetic autonomic (poly)neuropathy: Secondary | ICD-10-CM

## 2020-04-10 DIAGNOSIS — G8929 Other chronic pain: Secondary | ICD-10-CM

## 2020-04-10 DIAGNOSIS — M62838 Other muscle spasm: Secondary | ICD-10-CM | POA: Diagnosis not present

## 2020-04-10 DIAGNOSIS — R1115 Cyclical vomiting syndrome unrelated to migraine: Secondary | ICD-10-CM

## 2020-04-10 DIAGNOSIS — E118 Type 2 diabetes mellitus with unspecified complications: Secondary | ICD-10-CM

## 2020-04-10 DIAGNOSIS — M545 Low back pain: Secondary | ICD-10-CM

## 2020-04-10 DIAGNOSIS — M25511 Pain in right shoulder: Secondary | ICD-10-CM

## 2020-04-10 MED ORDER — BACLOFEN 5 MG PO TABS: 5.0000 mg | ORAL_TABLET | Freq: Three times a day (TID) | ORAL | 2 refills | Status: DC | PRN

## 2020-04-10 NOTE — Patient Instructions (Addendum)
Take Baclofen 1 tablet for neck muscle spasm pain, up to three times a day. If it helps a little, and you are tolerating it, you can increase to two tablets every 8 hours.    A referral to Physical Therapy to fit you with a T.E.N.S. and work on your neck muscle spasm.  You may also use the TENS unit on your lower back when it hurts a lot.   Use the honey, salt water nasal spray and chloraseptic spray for the burns to your inner nose, and throat.

## 2020-04-14 ENCOUNTER — Other Ambulatory Visit: Payer: Self-pay | Admitting: Family Medicine

## 2020-04-14 DIAGNOSIS — I10 Essential (primary) hypertension: Secondary | ICD-10-CM

## 2020-04-14 NOTE — Progress Notes (Signed)
[name] [name] is alone Sources of clinical information for visit is/are patient and past medical records. Nursing assessment for this office visit was reviewed with the patient for accuracy and revision.     Previous Report(s) Reviewed: historical medical records  Depression screen [facility] 2/9 04/10/2020  Decreased Interest 3  Down, Depressed, Hopeless -  PHQ - 2 Score 3  Altered sleeping -  Tired, decreased energy -  Change in appetite -  Feeling bad or failure about yourself  -  Trouble concentrating -  Moving slowly or fidgety/restless -  Suicidal thoughts -  PHQ-9 Score -  Difficult doing work/chores -  Some recent data might be hidden    Fall Risk  11/16/2018 07/28/2018 01/05/2018 12/09/2017 09/28/2017  Falls in the past year? 0 No No No No  Number falls in past yr: 0 - - - -  Injury with Fall? 0 - - - -  Risk for fall due to : - - - - -  Risk for fall due to: Comment - - - - -    PHQ9 SCORE ONLY 04/10/2020 02/21/2020 11/08/2019  Score 3 0 3    Adult vaccines due  Topic Date Due  . TETANUS/TDAP  09/29/2023  . PNEUMOCOCCAL POLYSACCHARIDE VACCINE AGE 3-64 HIGH RISK  Completed    Health Maintenance Due  Topic Date Due  . COVID-19 Vaccine (1) Never done  . OPHTHALMOLOGY EXAM  07/08/2017      History/P.E. limitations: none  Adult vaccines due  Topic Date Due  . TETANUS/TDAP  09/29/2023  . PNEUMOCOCCAL POLYSACCHARIDE VACCINE AGE 3-64 HIGH RISK  Completed    Diabetes Health Maintenance Due  Topic Date Due  . OPHTHALMOLOGY EXAM  07/08/2017  . HEMOGLOBIN A1C  08/20/2020  . FOOT EXAM  02/20/2021    Health Maintenance Due  Topic Date Due  . COVID-19 Vaccine (1) Never done  . OPHTHALMOLOGY EXAM  07/08/2017     Chief Complaint  Patient presents with  . Medication Management  . Sinus Problem    CPT E&M Office Visit Time Before Visit; reviewing medical records (e.g. recent visits, labs, studies): 10 minutes During Visit (F2F time): 20 minutes After Visit  (discussion with family or HCP, prescribing, ordering, referring, calling result/recommendations or documenting on same day): 10 minutes Total Visit Time: 40 minutes

## 2020-04-14 NOTE — Assessment & Plan Note (Addendum)
Established problem worsened.  Ptient took Flexeril for neck and back pain which was followed by a flare of the patient's cyclic vomiting/ d. Gastroparesis.

## 2020-04-14 NOTE — Assessment & Plan Note (Addendum)
Established problem worsened. Patient believes this recent exacerbation started soon after taking some flexeril for her neck pain.   Recent exacerbation with gastric fluid chemical superficial injuries to nasal passages and pharynx Exacerbation eventually responded to regiment of Ativan, oxycodone and phenergan. Advised NS spray to nasal passages, and Chloraseptic spray to throat Flexeril added to her list of intolerant/allergy medications

## 2020-04-14 NOTE — Assessment & Plan Note (Addendum)
Established problem worsened.  Continue medications and other therapies:  See Cyclic vomiting a/p

## 2020-04-14 NOTE — Assessment & Plan Note (Addendum)
Established problem No red flags on history or exam Recurrence of trapezius muscle spasm. Referral to PT for TENS unit placement and soft tissue release interventions.

## 2020-04-14 NOTE — Assessment & Plan Note (Signed)
Established problem Controlled per patient report of home CBGs Continue current medications and other regiments

## 2020-04-29 ENCOUNTER — Ambulatory Visit: Payer: 59 | Admitting: Physical Therapy

## 2020-05-12 ENCOUNTER — Ambulatory Visit: Payer: 59 | Attending: Family Medicine

## 2020-05-12 ENCOUNTER — Other Ambulatory Visit: Payer: Self-pay

## 2020-05-12 DIAGNOSIS — M25611 Stiffness of right shoulder, not elsewhere classified: Secondary | ICD-10-CM | POA: Insufficient documentation

## 2020-05-12 DIAGNOSIS — M542 Cervicalgia: Secondary | ICD-10-CM | POA: Diagnosis present

## 2020-05-12 DIAGNOSIS — G8929 Other chronic pain: Secondary | ICD-10-CM | POA: Diagnosis present

## 2020-05-12 DIAGNOSIS — M6281 Muscle weakness (generalized): Secondary | ICD-10-CM | POA: Insufficient documentation

## 2020-05-12 DIAGNOSIS — M25511 Pain in right shoulder: Secondary | ICD-10-CM | POA: Diagnosis present

## 2020-05-12 NOTE — Therapy (Addendum)
[place], [place], [address] Phone: [phone]   Fax:  [phone]  Physical Therapy Evaluation/Discharge  Patient Details  Name: [name] [name] MRN: [id number] Date of Birth: June 12, 1966 Referring Provider (PT): Lissa Morales, MD   Encounter Date: 05/12/2020  PT End of Session - 05/12/20 1045    Visit Number  1    Number of Visits  12    Date for PT Re-Evaluation  06/20/20    Authorization Type  [employer] MCR    Authorization Time Period  KX visit 15    Progress Note Due on Visit  10    PT Start Time  1005    PT Stop Time  1050    PT Time Calculation (min)  45 min    Activity Tolerance  Patient tolerated treatment well;Patient limited by pain    Behavior During Therapy  [facility] for tasks assessed/performed       Past Medical History:  Diagnosis Date  . Abdominal pain, generalized   . Acquired bilateral hammer toes 01/20/2017  . Acute post-traumatic stress disorder 11/17/2018  . AKI (acute kidney injury) ([place])   . Allergy   . Anemia   . Anxiety   . Arthritis   . At risk for polypharmacy 08/15/2014  . Biceps tendinopathy of right upper extremity 04/05/2014  . Blood transfusion 2003  . Cataract    both eyes  . Chronic generalized abdominal pain   . Chronic generalized abdominal pain   . Chronic kidney disease (CKD), stage III (moderate)   . Chronic leg cramping 07/18/2014  . CKD (chronic kidney disease) 08/17/2013   Seen by Dr Joelyn Oms 07/28/15 ([place]) - (see scanned document): 20 years diabetes, 15 years HTN, stable CKD stage III with proteinuria.  - CKD, discussed NSAID avoidance, diabetes control with target A1c<7.5%, and HTN control with SBP<140. F/u 6 months and repeat renal panel at that time. - Microalbuminuria: On lisinopril 36m, suspected secondary due to diabetic nephropathy. - Chronic back pain: No NSDAIDs. - Say amlodipine causes nausea  - Advised not to ACE-i during vomiting  episodes    . Complication of anesthesia    "problems waking up"  . Cyclic vomiting syndrome   . Delayed gastric emptying 05/12/2011    04/12/11 NUCLEAR MEDICINE GASTRIC EMPTYING SCAN [employer]: 2.0 mCi [id number] colloid. Comparison: None. Findings: At 1 hour, 81% of the counts remain in the stomach. At 2 hours, 53% remain. Normal is less than 30% at 2 hours. IMPRESSION: Moderate delay in gastric emptying.      . Depression   . Depression with anxiety 02/23/2007   See [facility] Note dated 05/06/2016.  IC (KGwenlyn Saranwas called in and I did a brief assessment.   . Diabetic gastroparesis ([facility]   /e-chart  . Diabetic ketoacidosis without coma associated with type 2 diabetes mellitus ([place]  . DKA (diabetic ketoacidoses) ([facility] [place]8/07/2016  . Dysuria 10/01/2016  . Elevated cholesterol 11/28/2015  . Emotional stress 12/30/2015   Stress after Breakup with partnes of 13 years on 12/27/2015   . Encounter for chronic pain management 01/06/2015   Indication for chronic opioid: Chronic back pain, cyclic vomiting Medication and dose: oxycodone 53m46m# pills per month: 15 Last UDS date: 05/08/2015 - result pending Pain contract signed (Y/N): Yes, reviewed 01/06/2015  Date narcotic database last reviewed (include red flags): 05/08/2015 (no red flags).    . Erosive esophagitis 08/06/2019  . Esophagitis 04/18/2013  Documented by Dr. Henrene Pastor EGD 03/2011  He also consulted on her during hospitalization 22/2979 for cyclical vomiting. No repeat EGD necessary, treat esophagitis with PPI.    Marland Kitchen Esophagitis determined by endoscopy 04/18/2013   Documented by Dr. Henrene Pastor EGD 03/2011  He also consulted on her during hospitalization 89/2119 for cyclical vomiting. No repeat EGD necessary, treat esophagitis with PPI.    Marland Kitchen Essential hypertension 05/01/2016  . Essential hypertension, malignant 02/23/2007   Jan 2015 - stopped recently-started norvasc due to reports of GI symptoms. 02/2015 - holding lisinopril, adding hydralazine  70m TID, and will take 1/2 tab lisinopril when feeling better when not in cyclic vomiting flare   . Essential hypertension, malignant 02/23/2007   Jan 2015 - stopped recently-started norvasc due to reports of GI symptoms. 02/2015 - holding lisinopril, adding hydralazine 152mTID, and will take 1/2 tab lisinopril when feeling better when not in cyclic vomiting flare   . GERD (gastroesophageal reflux disease)   . Grief reaction 10/16/2018  . Heart murmur   . Hematuria 07/09/2015  . High risk social situation 08/31/2012  . Hip pain, bilateral 03/31/2018  . History of colonic polyps 11/15/2018   10/2017 screening colonoscopy Dr JoScarlette ShortsSeven polyps were found in the sigmoid colon, transverse colon, ascending colon and cecum. The polyps were 2 to 10 mm in size. These polyps were removed with a cold snare.   . Marland Kitchenot flashes 07/29/2011  . Hypertension   . Incontinence 10/03/2017  . INSOMNIA NOS 02/23/2007   Qualifier: Diagnosis of  By: HaBeryle Lathe  . Intermittent constipation 06/06/2015  . Intestinal impaction (HCO[place]  . Intractable vomiting 07/02/2016  . Marijuana smoker, continuous    pt. denies does smoke marijuana  . MIGRAINE, UNSPEC., W/O INTRACTABLE MIGRAINE 02/23/2007   Qualifier: Diagnosis of  By: HaBeryle Lathe  . My[facility]   "they say I've had a silent one; I don't know"  . Nausea and vomiting in adult 06/06/2013  . Neuromuscular disorder (HCC)    neuropathy in both legs  . NEUROPATHY, DIABETIC 02/23/2007  . Nonspecific low back pain 04/10/2007   Back pain is chronic. Oxycodone per prior notes helps pt be more active. Reportedly uses 4 tab/week oxy for intermittent worsening of chronic back pain but mostly for cyclic vom to keep her out of hospital.    . Normocytic anemia   . Onychomycosis 03/25/2015  . PANCREATITIS 05/09/2008  . Pneumonia   . Pre-ulcerative calluses 05/27/2017  . Primary income source is SS Disability Income 03/10/2017   Disability SSI  .  Protein-calorie malnutrition, severe (HCSemmes12/22/2014  . Pure hypercholesterolemia 08/15/2014   Based on her choleserol, BP, smoker status, and that she is a diabetic, her 10 year ASCVD risk is 9.9% putting her in a category of risk that would benefit from high-intensity statin.    . Rotator cuff syndrome 04/19/2014   rt. shoulder  . Smoker   . Stye external 09/02/2017  . TOBACCO DEPENDENCE 02/23/2007   2 cigarettes every few days 11/2012 3 cigarettes daily    . Type II diabetes mellitus (HCLima  . Uncontrolled secondary diabetes mellitus with stage 3 CKD (GFR 30-59) (HCC) 10/23/2011   Nephropathy, gastroparesis  HgbA1c (06/2011) 7.6, 9.5, 12.2, 8.2, 10.9 (07/2012), 9.2 (02/11/2013), 7.7 (09/27/13)  Metformin: she has a history of cyclical vomiting; she would prefer not to take this medication  On lanuts 20U qam with improved control. 09/27/13   . Vitamin D  deficiency 11/28/2014    Past Surgical History:  Procedure Laterality Date  . ABDOMINAL HYSTERECTOMY  02/2002  . CARDIAC CATHETERIZATION    . CATARACT EXTRACTION W/ INTRAOCULAR LENS  IMPLANT, BILATERAL    . CESAREAN SECTION     x 3  . CHOLECYSTECTOMY  1980's  . COLONOSCOPY     patient not sure  . DILATION AND EVACUATION  X3  . laparotomy and lysis of adhesions    . left hand surgery    . PORT-A-CATH placement  ~ 2008   right chest; "poor access; frequent hospitalizations"  . SHOULDER ARTHROSCOPY Right 07/21/2019   [facility] (Ortho): Arthroscopic rotator cuff repair; Arthroscopic distal clavicle resection; Arthroscopic subacromial decompression and bursectomy;  Debridement of degenerative labral tear  . SMALL INTESTINE SURGERY     laporotomy with lysis of adhesions.  Marland Kitchen UPPER GASTROINTESTINAL ENDOSCOPY    . UPPER GI ENDOSCOPY      There were no vitals filed for this visit.   Subjective Assessment - 05/12/20 1009    Subjective  She reports RT scapula  on Rt pain ful with neck and RTR shoulder pain.   She has had surgery to Rt  shoulder 06/2019. The pain has been there for 5-6 year though eased post surgery but pauin returned 08/2020.    Pertinent History  RT shoulder scope    Limitations  Lifting;House hold activities    Diagnostic tests  none    Patient Stated Goals  Decrease pain    Currently in Pain?  Yes    Pain Score  7     Pain Location  Shoulder   and neck   Pain Orientation  Right;Posterior    Pain Descriptors / Indicators  Dull;Aching    Pain Type  Chronic pain    Pain Onset  More than a month ago    Pain Frequency  Constant    Aggravating Factors   using RT arm    Pain Relieving Factors  massage , ointment,  meds         [facility] PT Assessment - 05/12/20 0001      Assessment   Medical Diagnosis  cerviCALGIA , rt SHOULDER PAAIN.     Referring Provider (PT)  Sherren Mocha McDiarmid, MD    Onset Date/Surgical Date  --   06/2020   Hand Dominance  Right    Prior Therapy  For RT shoulder prior to surgery and 3 visits with another provider location      Precautions   Precautions  None      Restrictions   Weight Bearing Restrictions  No      Balance Screen   Has the patient fallen in the past 6 months  Yes    How many times?  1   moving a rug pulling when tape gave   Has the patient had a decrease in activity level because of a fear of falling?   No    Is the patient reluctant to leave their home because of a fear of falling?   No      Prior Function   Level of Independence  Independent      Cognition   Overall Cognitive Status  Within Functional Limits for tasks assessed      Observation/Other Assessments   Focus on Therapeutic Outcomes (FOTO)   38% limited with prediction of 35% limited post PT      Posture/Postural Control   Posture Comments  forwARD  head she can correct.,  T      ROM / Strength   AROM / PROM / Strength  AROM;PROM;Strength      AROM   AROM Assessment Site  Shoulder;Cervical    Right/Left Shoulder  Right    Right Shoulder Flexion  145 Degrees   135 LT   Right Shoulder  ABduction  140 Degrees   130 LT   Right Shoulder Internal Rotation  55 Degrees    Right Shoulder External Rotation  80 Degrees    Cervical Flexion  30   pain iin area of complaint   Cervical Extension  50    Cervical - Right Side Bend  40    Cervical - Left Side Bend  30    Cervical - Right Rotation  65    Cervical - Left Rotation  80      Strength   Overall Strength Comments  UE WNL except RT shoulder ER/abduction and flexion 4+/5 due to pain.       Palpation   Palpation comment  Tender RT neck and thoracic paraspinals                  Objective measurements completed on examination: See above findings.              PT Education - 05/12/20 1044    Education Details  POC HEP  Sitting posture with relaxation of  shoulders  with pillow support.    Person(s) Educated  Patient    Methods  Explanation;Demonstration;Tactile cues;Verbal cues;Handout    Comprehension  Verbalized understanding;Returned demonstration       PT Short Term Goals - 05/12/20 1104      PT SHORT TERM GOAL #1   Title  pt to be I with inital HEP     Time  3    Period  Weeks    Status  New      PT SHORT TERM GOAL #2   Title  pt to verbalize and demo pain relief techniques via RICE and HEP, posture    Time  3    Period  Weeks      PT SHORT TERM GOAL #3   Title  She will report pain overall decr 30% or more    Time  3    Period  Weeks    Status  New        PT Long Term Goals - 05/12/20 1105      PT LONG TERM GOAL #2   Title  She will report able to lift and carry at home with 1-2 max pain for laundry and groceries    Time  6    Period  Weeks    Status  New      PT LONG TERM GOAL #3   Title  pt to be able to lift/ lower to and from a over head shelf >/= 5# and push/ pull >/= 15# with </= 4/10 pain for functional lifting    Time  6    Period  Weeks    Status  New      PT LONG TERM GOAL #4   Title  Increase FOTO score to </= 35% limited to demo imporovement in funciton     Time  6    Period  Weeks    Status  New      PT LONG TERM GOAL #5   Title  pt to be I with all HEP given as of last visit to maintain and  progress current level of function     Time  6    Period  Weeks    Status  New             Plan - 05/12/20 1019    Clinical Impression Statement  [name] [name] with chronic RT scapula pain that appears to be comn=ing form her neck and cervical flexion cause most pain in area of complaint. She has some minor stiffness of her neck.   She has some RT shoulder weakness most   likely due to pain.   She should improve with skilled PT and consistent HEP.   Medicare does not pay for a TENS unit but we will check to see if  her managed care does. She will need to purcvhase online if the plan does not.    Personal Factors and Comorbidities  Past/Current Experience;Time since onset of injury/illness/exacerbation;Comorbidity 1    Comorbidities  R shoulder scope.    Examination-Activity Limitations  Lift;Reach Overhead;Bathing;Hygiene/Grooming;Carry    Examination-Participation Restrictions  Cleaning;Community Activity;Laundry;Meal Prep    Stability/Clinical Decision Making  Evolving/Moderate complexity    Clinical Decision Making  Moderate    PT Frequency  2x / week    PT Duration  6 weeks    PT Treatment/Interventions  Electrical Stimulation;Moist Heat;Ultrasound;Manual techniques;Therapeutic exercise;Therapeutic activities;Patient/family education;Passive range of motion;Dry needling;Taping    PT Next Visit Plan  Review thoracic rotation stretch. add to HEP cervical ROM and general upper 1/4 strength,   modalities, manual, traction trial    PT Home Exercise Plan  Thoracic rotation to RT , support RT arm with sitting    Consulted and Agree with Plan of Care  Patient       Patient will benefit from skilled therapeutic intervention in order to improve the following deficits and impairments:  Pain, Postural dysfunction, Decreased strength, Decreased  activity tolerance, Increased muscle spasms, Decreased range of motion  Visit Diagnosis: Chronic right shoulder pain  Muscle weakness (generalized)  Stiffness of right shoulder, not elsewhere classified  Cervicalgia     Problem List Patient Active Problem List   Diagnosis Date Noted  . Tubular adenoma of colon 12/03/2019  . Cannabis dependence, uncomplicated ([place]) 33/54/5625  . History of multiple colonic tubular adenomas 11/15/2018  . Left ventricular diastolic dysfunction with preserved systolic function 63/89/3734  . Rotator cuff tendonopathies with partial tear, right 03/31/2018  . Healthcare maintenance 09/02/2017  . Primary income source is SS Disability Income 03/10/2017  . Port-A-Cath in place 01/20/2017  . Secondary hyperparathyroidism of renal origin ([place]) 10/05/2016  . Recurrent major depressive disorder ([place])   . Essential hypertension 05/01/2016  . Diabetic gastroparesis associated with type 2 diabetes mellitus ([place]) 05/01/2016  . DM (diabetes mellitus), type 2 with complications ([place])   . Normocytic anemia   . Intermittent constipation 06/06/2015  . Encounter for monitoring opioid maintenance therapy 01/06/2015  . Vitamin D deficiency 11/28/2014  . Pure hypercholesterolemia 08/15/2014  . Chronic kidney disease (CKD) stage G4/A2, severely decreased glomerular filtration rate (GFR) between 15-29 mL/min/1.73 square meter and albuminuria creatinine ratio between 30-299 mg/g (HCC) 08/17/2013  . Allergic rhinitis, seasonal 05/04/2012  . Marijuana smoker ([place]) 11/03/2011  . Cyclic vomiting syndrome 08/03/2011  . Neck muscle spasm 01/01/2011  . Chronic bilateral low back pain 04/10/2007  . Anxiety disorder 02/23/2007  . Tobacco abuse 02/23/2007  . Chronic insomnia secondary to General Medical and Mood disorders 02/23/2007  . DM neuropathies ([place]) 02/23/2007  . GERD with esophagitis 01/27/2004  Darrel Hoover  PT 05/12/2020, 11:20 AM  [facility] [address] [place], [place], [address] Phone: [phone]   Fax:  [phone]  [name] [name] MRN: [id number] Date of Birth: 1966/06/02 PHYSICAL THERAPY DISCHARGE SUMMARY  Visits from Start of Care: 1  Current functional level related to goals / functional outcomes: She did not return after the eval . Unclear as to reason   Remaining deficits: Unknown   Education / Equipment: NA  Plan:                                                    Patient goals were not met. Patient is being discharged due to not returning since the last visit.  ?????   Pearson Forster PT 07/07/20

## 2020-05-12 NOTE — Patient Instructions (Signed)
Thoracic rotation to RT 3-10 reps 5-10 sec hold 2-3x/day

## 2020-05-15 ENCOUNTER — Telehealth: Payer: Self-pay | Admitting: Family Medicine

## 2020-05-15 DIAGNOSIS — R1115 Cyclical vomiting syndrome unrelated to migraine: Secondary | ICD-10-CM

## 2020-05-15 NOTE — Telephone Encounter (Signed)
Will forward to MD. [name] [name],CMA  

## 2020-05-15 NOTE — Telephone Encounter (Signed)
Spoke with patient and let her know of the lotion to pick up over the counter.  Patient voiced understanding and will go get this today.  She wanted to let Dr. McDiarmid know that she has hired an Conservation officer, nature who is coming out tomorrow to spray her home.  She was seen at an urgent care and given permethrin cream and is using it with no relief.  Patient states that she has used to whole tube and will try to get this refilled.  She is also going to check with the pharmacist to see what she can use on her body that may work better.  Patient is very tearful as she has had to pay a lot for the exterminator and feels like "they are eating her skin".  Will forward to MD as she is also out of her pain medication today.  [name],CMA

## 2020-05-15 NOTE — Telephone Encounter (Signed)
Patient calling to see if McDiarmid can call her in a shampoo or something to help stop with the itching. She says she was diagnosed with scabies and she says she 'feels things crawling all over her and all over her clothes.'  Please call patient back to discuss.

## 2020-05-15 NOTE — Addendum Note (Signed)
Addended by: Valerie Roys on: 05/15/2020 04:51 PM   Modules accepted: Orders

## 2020-05-15 NOTE — Telephone Encounter (Signed)
Please let Ms Arif know that I recommend Scalpicin Anti-itch liquid to use on her scalp as bottle directs.  It is over the counter.

## 2020-05-22 ENCOUNTER — Ambulatory Visit (INDEPENDENT_AMBULATORY_CARE_PROVIDER_SITE_OTHER): Payer: 59 | Admitting: Family Medicine

## 2020-05-22 ENCOUNTER — Encounter: Payer: Self-pay | Admitting: Family Medicine

## 2020-05-22 ENCOUNTER — Other Ambulatory Visit: Payer: Self-pay

## 2020-05-22 VITALS — BP 132/74 | HR 73 | Ht 62.0 in | Wt 146.0 lb

## 2020-05-22 DIAGNOSIS — F331 Major depressive disorder, recurrent, moderate: Secondary | ICD-10-CM

## 2020-05-22 DIAGNOSIS — E118 Type 2 diabetes mellitus with unspecified complications: Secondary | ICD-10-CM | POA: Diagnosis not present

## 2020-05-22 DIAGNOSIS — Z1231 Encounter for screening mammogram for malignant neoplasm of breast: Secondary | ICD-10-CM | POA: Diagnosis not present

## 2020-05-22 DIAGNOSIS — R442 Other hallucinations: Secondary | ICD-10-CM

## 2020-05-22 DIAGNOSIS — N184 Chronic kidney disease, stage 4 (severe): Secondary | ICD-10-CM

## 2020-05-22 DIAGNOSIS — L259 Unspecified contact dermatitis, unspecified cause: Secondary | ICD-10-CM

## 2020-05-22 DIAGNOSIS — E1142 Type 2 diabetes mellitus with diabetic polyneuropathy: Secondary | ICD-10-CM | POA: Diagnosis not present

## 2020-05-22 DIAGNOSIS — L299 Pruritus, unspecified: Secondary | ICD-10-CM

## 2020-05-22 DIAGNOSIS — L989 Disorder of the skin and subcutaneous tissue, unspecified: Secondary | ICD-10-CM

## 2020-05-22 LAB — POCT GLYCOSYLATED HEMOGLOBIN (HGB A1C): HbA1c, POC (controlled diabetic range): 10.4 % — AB (ref 0.0–7.0)

## 2020-05-22 MED ORDER — PREDNISONE 10 MG PO TABS: 30.0000 mg | ORAL_TABLET | Freq: Every day | ORAL | 0 refills | Status: AC

## 2020-05-22 MED ORDER — QUETIAPINE FUMARATE 50 MG PO TABS: 50.0000 mg | ORAL_TABLET | Freq: Every day | ORAL | 1 refills | Status: DC

## 2020-05-22 MED ORDER — OXYCODONE HCL 5 MG PO TABS: 5.0000 mg | ORAL_TABLET | Freq: Four times a day (QID) | ORAL | 0 refills | Status: DC | PRN

## 2020-05-22 NOTE — Patient Instructions (Addendum)
You do not have bugs on you.  I believe you have gotten rid of them from your body.  The feeling of the bugs has been left in you.  Dr McDiarmid recommends two medications to help with these feelings on your skin and body.  Prednisone 3 tablets at bedtime for 10 days and Quetiapine 50 mg to take at night to help with sleep and the feeling of bugs on your skin and in your body.  It will take several weeks to get rid of these feelings of bugs on your skin and body, so please do not stop taking the medications just because they have not worked right away.  It takes time.   A referral to a dermatologist has been made to look into the feeling of bugs on your skin and body.

## 2020-05-22 NOTE — Addendum Note (Signed)
Addended byWendy Poet, Emersyn Kotarski D on: 05/22/2020 10:58 AM   Modules accepted: Orders

## 2020-05-23 ENCOUNTER — Encounter: Payer: Self-pay | Admitting: Family Medicine

## 2020-05-23 DIAGNOSIS — L989 Disorder of the skin and subcutaneous tissue, unspecified: Secondary | ICD-10-CM | POA: Insufficient documentation

## 2020-05-23 HISTORY — DX: Disorder of the skin and subcutaneous tissue, unspecified: L98.9

## 2020-05-23 LAB — RENAL FUNCTION PANEL
Albumin: 4.2 g/dL (ref 3.8–4.9)
BUN/Creatinine Ratio: 12 (ref 9–23)
BUN: 33 mg/dL — ABNORMAL HIGH (ref 6–24)
CO2: 18 mmol/L — ABNORMAL LOW (ref 20–29)
Calcium: 9.1 mg/dL (ref 8.7–10.2)
Chloride: 105 mmol/L (ref 96–106)
Creatinine, Ser: 2.65 mg/dL — ABNORMAL HIGH (ref 0.57–1.00)
GFR calc Af Amer: 23 mL/min/{1.73_m2} — ABNORMAL LOW (ref >59)
GFR calc non Af Amer: 20 mL/min/{1.73_m2} — ABNORMAL LOW (ref >59)
Glucose: 104 mg/dL — ABNORMAL HIGH (ref 65–99)
Phosphorus: 3.9 mg/dL (ref 3.0–4.3)
Potassium: 4.7 mmol/L (ref 3.5–5.2)
Sodium: 138 mmol/L (ref 134–144)

## 2020-05-23 LAB — CBC
Hematocrit: 32.6 % — ABNORMAL LOW (ref 34.0–46.6)
Hemoglobin: 10.8 g/dL — ABNORMAL LOW (ref 11.1–15.9)
MCH: 30.6 pg (ref 26.6–33.0)
MCHC: 33.1 g/dL (ref 31.5–35.7)
MCV: 92 fL (ref 79–97)
Platelets: 223 10*3/uL (ref 150–450)
RBC: 3.53 x10E6/uL — ABNORMAL LOW (ref 3.77–5.28)
RDW: 15.4 % (ref 11.7–15.4)
WBC: 5.6 10*3/uL (ref 3.4–10.8)

## 2020-05-23 NOTE — Assessment & Plan Note (Signed)
Established problem worsened.  Lab Results  Component Value Date   HGBA1C 10.4 (A) 05/22/2020    Will address at next ov in 2 weeks as I do not believe [name] [name] was in a state of mind where we could have had a productive discussion

## 2020-05-23 NOTE — Assessment & Plan Note (Signed)
PHQ9 SCORE ONLY 05/22/2020 04/10/2020 02/21/2020  PHQ-9 Total Score 19 3 0  Suspect current high score may be related to her skin condition.  Starting Seroquel 50 mg qhs Denies thoughts of harm to self or others

## 2020-05-23 NOTE — Progress Notes (Signed)
[name] [name] is alone Sources of clinical information for visit is/are patient and past medical records. Nursing assessment for this office visit was reviewed with the patient for accuracy and revision.     Previous Report(s) Reviewed: historical medical records and lab reports  Depression screen [facility] 2/9 05/22/2020  Decreased Interest 3  Down, Depressed, Hopeless 3  PHQ - 2 Score 6  Altered sleeping 3  Tired, decreased energy 3  Change in appetite 3  Feeling bad or failure about yourself  3  Trouble concentrating 1  Moving slowly or fidgety/restless 0  Suicidal thoughts 0  PHQ-9 Score 19  Difficult doing work/chores -  Some recent data might be hidden    Fall Risk  11/16/2018 07/28/2018 01/05/2018 12/09/2017 09/28/2017  Falls in the past year? 0 No No No No  Number falls in past yr: 0 - - - -  Injury with Fall? 0 - - - -  Risk for fall due to : - - - - -  Risk for fall due to: Comment - - - - -    PHQ9 SCORE ONLY 05/22/2020 04/10/2020 02/21/2020  PHQ-9 Total Score 19 3 0    Adult vaccines due  Topic Date Due  . TETANUS/TDAP  09/29/2023  . PNEUMOCOCCAL POLYSACCHARIDE VACCINE AGE 63-64 HIGH RISK  Completed    Health Maintenance Due  Topic Date Due  . COVID-19 Vaccine (1) Never done  . OPHTHALMOLOGY EXAM  07/08/2017  . MAMMOGRAM  05/19/2020      History/P.E. limitations: acuity of illness  Adult vaccines due  Topic Date Due  . TETANUS/TDAP  09/29/2023  . PNEUMOCOCCAL POLYSACCHARIDE VACCINE AGE 63-64 HIGH RISK  Completed    Diabetes Health Maintenance Due  Topic Date Due  . OPHTHALMOLOGY EXAM  07/08/2017  . HEMOGLOBIN A1C  11/22/2020  . FOOT EXAM  02/20/2021    Health Maintenance Due  Topic Date Due  . COVID-19 Vaccine (1) Never done  . OPHTHALMOLOGY EXAM  07/08/2017  . MAMMOGRAM  05/19/2020     Chief Complaint  Patient presents with  . Rash   Chief Complaint:  Rash  HPI:  Location: widespread open skin erosions with arms, bilateral ears,back She  feels the bugs crawling on and in her skin  No lesions between finger, axilla, groin       Duration: several weeks        Severity: patient perceives as very severe, causing tears Modifying factors: Used permethrin  How did it start?: Pt's belief is that there where ticks attached to her that she pulled out but left some type of bug deep to her skin wounds.  She was concerned that she had scabies or head lice and treated herself with permethrin a few days ago.   She just had her home exterminated.   She has been pulling "little spiders" out of the wounds Context:      Travel: none     Outdoor activities:none      Itch: yes      Painful: yes      Oral lesions: no      Impairing social/occupational/personal activites: thoughts and feelings that her skin is infested has become constant, even preventing her from falling asleep SH:        Occupation: does not work, disabled  Pertinent PMHx:         History of similar skin problems: no        Immunosuppression: no  No known mental health disorder than  MDD.  No history of psychosis. No known illicit drug use in past other than marijuana.  She request to have her oxycodone filled.   P.E.  Today's Vitals   05/22/20 1010  BP: 132/74  Pulse: 73  SpO2: 100%  Weight: 146 lb (66.2 kg)  Height: 5\' 2"  (1.575 m)  PainSc: 8   PainLoc: Back   Body mass index is 26.7 kg/m.  Gen: tearful, sobbing at times, groomed, tearfulness ended by end of visit, Multiple alcohol open alcohol swabs and pads on exam table.  Skin:  Sores are about size of end of fingernail., with red bases, mnormal surrounding skin, couple mmm deep, linear erosions with red bases on antihelices of pinna bilaterally.  There is no increased erythema around wounds, no drainaige or discharge from wounds.   Patient actively scraping into wound on ears and arms, with alcohol swab showing yellow fluid scrapes or tiny flecks  that she said were the "bugs" she had been  finding.    Magnifying glass examination of her specimens presented appeared to be serumin and tiny pieces of skin/tissue.        CPT E&M Office Visit Time Before Visit; reviewing medical records (e.g. recent visits, labs, studies): 5 minutes During Visit (F2F time): 30 minutes After Visit (discussion with family or HCP, prescribing, ordering, referring, calling result/recommendations or documenting on same day): 20 minutes Total Visit Time: 55 minutes  Level 2 Est: 10-19 min     New: 15-29 min Level 3 Est: 20-29 min     New: 30-44 min Level 4 Est: 30-39 min     New: 45-59 min Level 5 Est: 40-54 min     New: 60-74 min

## 2020-05-23 NOTE — Assessment & Plan Note (Addendum)
New problem  I do not believe that [name] [name] is suffering from a parasitic infestation.   I am concerned that [name] [name] may be experiencing some tactile formication hallucinations that [name] [name] is obsessively interpreting as "bugs" in [name] [name] skin leading [name] [name] to compulsively scratch deeply into [name] [name] open skin wounds to retrieve the supposed creatures. None of the wounds appear to be infected.   This may be a condition of delusional infestation. If present, this episode may have been started with insect or bed bug bites or itching from the mast cell histamine relase induced itching, though [name] [name] has been on these opioid at this dose for a long time.     Attempts at persuasion that there were on insects or parasite or spiders in [name] [name] wounds would end with [name] [name] brief assent, followed by [name] [name] scratching into the wounds and showing me more body detritus as evidence of infestation.  [name] [name] denies thoughts of harming herself or others.  [name] [name] seem to agree to start of Seroquel 50 mg at bedtime to help with sleep, along with prednisone pulse dose in case this is an exaggerated response to scabies feces remaining in skin. I asked [name] [name] to monitor [name] [name] skin sores and should any become inflamed see was to apply OTC antibiotic ointment and let our office know.    I promised to make an appointment to skin a dermatologist.  In meantime, I will see [name] [name] back in two weeks.

## 2020-05-27 ENCOUNTER — Ambulatory Visit: Payer: 59 | Admitting: Physical Therapy

## 2020-06-05 ENCOUNTER — Ambulatory Visit (INDEPENDENT_AMBULATORY_CARE_PROVIDER_SITE_OTHER): Payer: 59 | Admitting: Family Medicine

## 2020-06-05 ENCOUNTER — Encounter: Payer: Self-pay | Admitting: Family Medicine

## 2020-06-05 ENCOUNTER — Other Ambulatory Visit: Payer: Self-pay

## 2020-06-05 VITALS — BP 174/64 | HR 60 | Ht 62.0 in | Wt 150.2 lb

## 2020-06-05 DIAGNOSIS — L989 Disorder of the skin and subcutaneous tissue, unspecified: Secondary | ICD-10-CM

## 2020-06-05 MED ORDER — QUETIAPINE FUMARATE 100 MG PO TABS: 100.0000 mg | ORAL_TABLET | Freq: Every day | ORAL | 1 refills | Status: DC

## 2020-06-05 NOTE — Patient Instructions (Signed)
Increase the Seroquel about 100 mg at bedtime.

## 2020-06-06 ENCOUNTER — Encounter: Payer: Self-pay | Admitting: Family Medicine

## 2020-06-09 ENCOUNTER — Ambulatory Visit: Payer: 59

## 2020-06-09 NOTE — Assessment & Plan Note (Signed)
Established problem Mild improvement.  Less distressed than last ov 5/28. Still feels that insects/bugs/spiders are crawling on and within her skin.   She continues to point towards her ears stating that there was a bug crawling on her during the interview.  There was no corresponding animate or inanimate objects visible at the bodily locations she indicated.   The prior wounds on her arms, right posterior trapezius region, and bilateral ears appear to be in various stages of healing. She continues to scratch at the wounds but less frantically so.   She is having fumigators to return to retreat her home.   She believes the itching became less intnesve after starting the prednisone pulse tx.  She has been sleeping somewhat better with the Seroquel 50 mg qhs.   Assessment Some improvement in the agitation displayed during interview two weeks ago, though the tactile hallucinations and misinterpretations/delusions persist.  Recommend increase Seroquel to 100 mg qhs.  Follow up after Ms Redmond Pulling consults with dermatology in the next few weeks.

## 2020-06-10 ENCOUNTER — Other Ambulatory Visit: Payer: Self-pay | Admitting: Family Medicine

## 2020-06-10 NOTE — Telephone Encounter (Signed)
Reviewed

## 2020-06-10 NOTE — Telephone Encounter (Signed)
Pt called to provide the name of cream she is using: Permethrin Cream

## 2020-06-10 NOTE — Telephone Encounter (Signed)
Will forward to MD as an FYI.  Avaleigh Decuir,CMA  

## 2020-06-13 MED ORDER — PERMETHRIN 5 % EX CREA: TOPICAL_CREAM | CUTANEOUS | 0 refills | Status: DC

## 2020-06-13 NOTE — Telephone Encounter (Signed)
Patient calls nurse line requesting a refill on Permethrin cream. Please advise.

## 2020-06-16 ENCOUNTER — Other Ambulatory Visit: Payer: Self-pay

## 2020-06-16 ENCOUNTER — Ambulatory Visit (INDEPENDENT_AMBULATORY_CARE_PROVIDER_SITE_OTHER): Payer: 59 | Admitting: Physician Assistant

## 2020-06-16 ENCOUNTER — Telehealth: Payer: Self-pay

## 2020-06-16 DIAGNOSIS — L309 Dermatitis, unspecified: Secondary | ICD-10-CM | POA: Diagnosis not present

## 2020-06-16 DIAGNOSIS — D485 Neoplasm of uncertain behavior of skin: Secondary | ICD-10-CM

## 2020-06-16 MED ORDER — IVERMECTIN 3 MG PO TABS: 200.0000 ug/kg | ORAL_TABLET | Freq: Once | ORAL | 0 refills | Status: AC

## 2020-06-16 MED ORDER — MUPIROCIN 2 % EX OINT: 1.0000 "application " | TOPICAL_OINTMENT | Freq: Every day | CUTANEOUS | 1 refills | Status: DC

## 2020-06-16 MED ORDER — CLOBETASOL PROPIONATE 0.05 % EX OINT: 1.0000 "application " | TOPICAL_OINTMENT | Freq: Two times a day (BID) | CUTANEOUS | 2 refills | Status: DC

## 2020-06-16 NOTE — Progress Notes (Signed)
   New Patient Visit  Subjective  [name] [name] is a 54 y.o. female who presents for the following: Skin Problem (Ears are very irritating. both of them they feel like crawing sensation and patient feels something is in there. Got premetherin cream but it didnt help. Been going since may. She also has some spots on her arms and legs with the same sensation of crawling. She has spots where she has scratched them. ). She feels a crawling sensation and she feels like something is moving and attached to her. She has never had lice or anything before. She Has multiple lesions on her legs that are raised and are the same shape she feels like something is in there. The left ear is the worse and she feels like this is where the problem started. Her scalp also itches and this started after she had a hair cut. Her PCP gave her prednisone and that didn't help very much. He also gave her permethrin and she applied this from head to toe twice. She lives at home alone and she does keep two grandkids and a boyfriend that doesn't live with her.   Objective  Well appearing patient in no apparent distress; mood and affect are within normal limits.  A focused examination was performed including trunk, face and extremities. Relevant physical exam findings are noted in the Assessment and Plan.  Objective  Left Superior Crus of Antihelix: Scaling slightly licenified patch left ear. She also had lichenified, eroded and excoriated lesions on her extremities.   Objective  Right Upper Arm - Anterior: No measurement per Anderson Malta.  Eroded lesion with neighboring hyperkeratotic lesions.     Assessment & Plan  Dermatitis Left Superior Crus of Antihelix  She can use the clobetasol on the ear and anything that is not open or eroded. If the lesion is eroded she is to use the mupirocin on it. I will treat her for scabies with Ivermectin but have advised her that I do not feel that this is a bug/parasite. I feel that it  may be neurologic/psychologic in nature and we may end up having to work with her PCP to medicate her for this.   clobetasol ointment (TEMOVATE) 0.05 % - Left Superior Crus of Antihelix  Neoplasm of uncertain behavior of skin Right Upper Arm - Anterior  Skin / nail biopsy Type of biopsy: tangential   Informed consent: discussed and consent obtained   Timeout: patient name, date of birth, surgical site, and procedure verified   Procedure prep:  Patient was prepped and draped in usual sterile fashion (Non sterile) Prep type:  Chlorhexidine Anesthesia: the lesion was anesthetized in a standard fashion   Anesthetic:  1% lidocaine w/ epinephrine 1-100,000 local infiltration Instrument used: flexible razor blade   Hemostasis achieved with: ferric subsulfate   Outcome: patient tolerated procedure well   Post-procedure details: wound care instructions given    Specimen 1 - Surgical pathology Differential Diagnosis:  rule out parasite. Possible prurigo nodule. Check Margins: No  Ordered Medications: mupirocin ointment (BACTROBAN) 2 %

## 2020-06-16 NOTE — Telephone Encounter (Signed)
Phone call from patient's pharmacy needing clarification for patient's Ivermectin prescription.  Clarification given to Pharmacist.

## 2020-06-16 NOTE — Patient Instructions (Signed)

## 2020-06-20 ENCOUNTER — Other Ambulatory Visit: Payer: Self-pay | Admitting: *Deleted

## 2020-06-20 DIAGNOSIS — R1115 Cyclical vomiting syndrome unrelated to migraine: Secondary | ICD-10-CM

## 2020-06-20 MED ORDER — OXYCODONE HCL 5 MG PO TABS: 5.0000 mg | ORAL_TABLET | Freq: Four times a day (QID) | ORAL | 0 refills | Status: DC | PRN

## 2020-06-24 ENCOUNTER — Telehealth: Payer: Self-pay | Admitting: *Deleted

## 2020-06-24 NOTE — Telephone Encounter (Signed)
Path to patient. She is doing some better still feels like her face is crawling. She did have a throwing up fit so didn't take her first dose till Saturday and she will take her second does this coming Saturday. She will call with any problems.

## 2020-06-24 NOTE — Telephone Encounter (Signed)
-----   Message from Arlyss Gandy, [place] sent at 06/24/2020 11:22 AM EDT ----- Scratched skin on path. No bugs on path. Call to see how she is doing after dose of ivermectin.

## 2020-07-02 ENCOUNTER — Encounter: Payer: Self-pay | Admitting: Physician Assistant

## 2020-07-10 ENCOUNTER — Ambulatory Visit
Admission: RE | Admit: 2020-07-10 | Discharge: 2020-07-10 | Disposition: A | Discharge status: Home / Self Care | Payer: 59 | Source: Ambulatory Visit | Attending: Family Medicine | Admitting: Family Medicine

## 2020-07-10 ENCOUNTER — Encounter: Payer: Self-pay | Admitting: Family Medicine

## 2020-07-10 ENCOUNTER — Ambulatory Visit (INDEPENDENT_AMBULATORY_CARE_PROVIDER_SITE_OTHER): Payer: 59 | Admitting: Family Medicine

## 2020-07-10 ENCOUNTER — Other Ambulatory Visit: Payer: Self-pay

## 2020-07-10 VITALS — BP 138/64 | HR 84 | Wt 152.0 lb

## 2020-07-10 DIAGNOSIS — F1220 Cannabis dependence, uncomplicated: Secondary | ICD-10-CM

## 2020-07-10 DIAGNOSIS — N2581 Secondary hyperparathyroidism of renal origin: Secondary | ICD-10-CM

## 2020-07-10 DIAGNOSIS — R112 Nausea with vomiting, unspecified: Secondary | ICD-10-CM

## 2020-07-10 DIAGNOSIS — E1143 Type 2 diabetes mellitus with diabetic autonomic (poly)neuropathy: Secondary | ICD-10-CM

## 2020-07-10 DIAGNOSIS — Z1159 Encounter for screening for other viral diseases: Secondary | ICD-10-CM

## 2020-07-10 DIAGNOSIS — K3184 Gastroparesis: Secondary | ICD-10-CM

## 2020-07-10 DIAGNOSIS — R1115 Cyclical vomiting syndrome unrelated to migraine: Secondary | ICD-10-CM

## 2020-07-10 DIAGNOSIS — E118 Type 2 diabetes mellitus with unspecified complications: Secondary | ICD-10-CM | POA: Diagnosis not present

## 2020-07-10 DIAGNOSIS — E559 Vitamin D deficiency, unspecified: Secondary | ICD-10-CM

## 2020-07-10 DIAGNOSIS — F1290 Cannabis use, unspecified, uncomplicated: Secondary | ICD-10-CM

## 2020-07-10 DIAGNOSIS — N184 Chronic kidney disease, stage 4 (severe): Secondary | ICD-10-CM

## 2020-07-10 DIAGNOSIS — R1013 Epigastric pain: Secondary | ICD-10-CM

## 2020-07-10 DIAGNOSIS — F331 Major depressive disorder, recurrent, moderate: Secondary | ICD-10-CM

## 2020-07-10 DIAGNOSIS — L989 Disorder of the skin and subcutaneous tissue, unspecified: Secondary | ICD-10-CM

## 2020-07-10 DIAGNOSIS — Z1231 Encounter for screening mammogram for malignant neoplasm of breast: Secondary | ICD-10-CM

## 2020-07-10 LAB — POCT URINALYSIS DIP (MANUAL ENTRY)
Bilirubin, UA: NEGATIVE
Glucose, UA: 500 mg/dL — AB
Ketones, POC UA: NEGATIVE mg/dL
Leukocytes, UA: NEGATIVE
Nitrite, UA: NEGATIVE
Spec Grav, UA: 1.01 (ref 1.010–1.025)
Urobilinogen, UA: 0.2 E.U./dL
pH, UA: 6 (ref 5.0–8.0)

## 2020-07-10 LAB — GLUCOSE, POCT (MANUAL RESULT ENTRY): POC Glucose: 180 mg/dl — AB (ref 70–99)

## 2020-07-10 LAB — POCT UA - MICROSCOPIC ONLY

## 2020-07-10 MED ORDER — INSULIN LISPRO (1 UNIT DIAL) 100 UNIT/ML (KWIKPEN): PEN_INJECTOR | SUBCUTANEOUS | 11 refills | Status: DC

## 2020-07-10 NOTE — Patient Instructions (Signed)
Start Humalog (lispro) insulin 2 units after having eating and kept down at least have of your dinner.   Please see Dr [name] in 2 weeks to work further on getting your blood sugars under control.  He may want to change the type of Jardiance medication.   Keep taking the Seroquel 50 mg at bedtime for now.  We would like to get you off this medication because it can interfere with your blood sugar control, your cholesterol and cause weight gain.    Speak with your pharmacist to see if there is a way to consolidate your refills to the same day.  Dr Halleigh Comes will be glad to work with the pharmacy to make this possible, if they can tell me how.

## 2020-07-15 ENCOUNTER — Encounter: Payer: Self-pay | Admitting: Family Medicine

## 2020-07-15 ENCOUNTER — Other Ambulatory Visit: Payer: Self-pay | Admitting: Family Medicine

## 2020-07-15 DIAGNOSIS — R928 Other abnormal and inconclusive findings on diagnostic imaging of breast: Secondary | ICD-10-CM

## 2020-07-15 NOTE — Addendum Note (Signed)
Addended by: Lissa Morales D on: 07/15/2020 02:38 PM   Modules accepted: Orders

## 2020-07-15 NOTE — Assessment & Plan Note (Signed)
Established problem Need to clarify what for of Vit D supplementation is [name] [name] taking.  Is it Vit D 1000 unit daily of ergocalciferol 50K weekly, and if so, for how long.  :ast 25-OH Vit D level was 29 in 2016.

## 2020-07-15 NOTE — Assessment & Plan Note (Signed)
Established problem worsened.  Continue medications and other therapies:  See Cyclic vomiting a/p

## 2020-07-15 NOTE — Progress Notes (Signed)
CPT E&M Office Visit Time Before Visit; reviewing medical records (e.g. recent visits, labs, studies): 5 minutes During Visit (F2F time): 20 minutes After Visit (discussion with family or HCP, prescribing, ordering, referring, calling result/recommendations or documenting on same day): 5 minutes Total Visit Time: 30 minutes  LEASA KINCANNON is unaccompanied Sources of clinical information for visit is/are patient and past medical records. Nursing assessment for this office visit was reviewed with the patient for accuracy and revision.     Previous Report(s) Reviewed: ER records, lab reports and office notes  Depression screen PHQ 2/9 07/10/2020  Decreased Interest 1  Down, Depressed, Hopeless 1  PHQ - 2 Score 2  Altered sleeping -  Tired, decreased energy -  Change in appetite -  Feeling bad or failure about yourself  -  Trouble concentrating -  Moving slowly or fidgety/restless -  Suicidal thoughts -  PHQ-9 Score -  Difficult doing work/chores -  Some recent data might be hidden    Fall Risk  06/05/2020 11/16/2018 07/28/2018 01/05/2018 12/09/2017  Falls in the past year? 1 0 No No No  Number falls in past yr: 0 0 - - -  Injury with Fall? 1 0 - - -  Risk for fall due to : - - - - -  Risk for fall due to: Comment - - - - -    PHQ9 SCORE ONLY 07/10/2020 06/05/2020 05/22/2020  PHQ-9 Total Score 2 0 19    Adult vaccines due  Topic Date Due  . TETANUS/TDAP  09/29/2023  . PNEUMOCOCCAL POLYSACCHARIDE VACCINE AGE 579-64 HIGH RISK  Completed    Health Maintenance Due  Topic Date Due  . Hepatitis C Screening  Never done  . COVID-19 Vaccine (1) Never done  . OPHTHALMOLOGY EXAM  07/08/2017      History/P.E. limitations: none  Adult vaccines due  Topic Date Due  . TETANUS/TDAP  09/29/2023  . PNEUMOCOCCAL POLYSACCHARIDE VACCINE AGE 579-64 HIGH RISK  Completed    Diabetes Health Maintenance Due  Topic Date Due  . OPHTHALMOLOGY EXAM  07/08/2017  . HEMOGLOBIN A1C  11/22/2020  .  FOOT EXAM  07/10/2021    Health Maintenance Due  Topic Date Due  . Hepatitis C Screening  Never done  . COVID-19 Vaccine (1) Never done  . OPHTHALMOLOGY EXAM  07/08/2017    Diabetic Foot Exam - Simple   Simple Foot Form Diabetic Foot exam was performed with the following findings: Yes 07/10/2020 11:59 AM  Visual Inspection No deformities, no ulcerations, no other skin breakdown bilaterally: Yes Sensation Testing Intact to touch and monofilament testing bilaterally: Yes Pulse Check Posterior Tibialis and Dorsalis pulse intact bilaterally: Yes Comments     Chief Complaint  Patient presents with  . Emesis

## 2020-07-15 NOTE — Addendum Note (Signed)
Addended byWendy Poet, Mallory Enriques D on: 07/15/2020 12:48 PM   Modules accepted: Orders

## 2020-07-15 NOTE — Assessment & Plan Note (Signed)
Established problem Pt to see Dr Joelyn Oms ([facility]) next month for monitoring CKD

## 2020-07-15 NOTE — Assessment & Plan Note (Signed)
Established problem that has improved.  Significant reduction in feeling and belief of skin infestation. Skin biopsy by dermatology was unrevealing and did not show evidence of infestation No current open wound  Continue current medication regiment Seroquel 50 mg qhs with gradual dose reduction in

## 2020-07-15 NOTE — Assessment & Plan Note (Addendum)
Established problem. Stable. Continue current medications and other regiments. Need to get ROI next ov for last op consultations with Dr Joelyn Oms ([facility]) [name] [name] is to see Dr Joelyn Oms next month

## 2020-07-15 NOTE — Assessment & Plan Note (Signed)
Established problem Controlled currently, despite a flare last week that lasted several days.   Ms Vierling finds that the Ativan does the most good of all her abortive therapies.  Continue current medications and other regiments

## 2020-07-15 NOTE — Assessment & Plan Note (Signed)
Established problem that has improved.  Anxiety around skin infestation belief is much improved. Only occasional episodes of formication  PHQ9 down to 8 Continue current medication regiment of Seroquel 50 mg daily with plan for grdaul dose reduction in future.

## 2020-07-15 NOTE — Assessment & Plan Note (Addendum)
Diabetes Mellitus Type Two  Disease Monitoring  Blood Sugar Ranges: 100s when not eating bc of vomiting cycle, post prandials in 300s.  Polyuria: no   Visual problems: no   Recent Medication Changes:  No   Medication Regiment Adherence: yes                                                         Medication Side Effects  Hypoglycemia: no   Diet Pattern: erratic, especially during exacerbation of CVS  Recent Physical Illness: no, other than CVS flare   Emotional Stressors: Family good relations with her adult children, Relationship estranged from hersiblings, nephews/nieces, cousins and Death thinks/remembers about the violent death of her son frequently  Level of Activity:  Sedentary  [place]                          Taking Prescribed Statin: no, intolerant - statins set off exacerbations of her CVS.  Zetia was started in February, 90 days with three refills, but it is no longer on her med list.  Uncertain why?             Taking Prescribed ACEI/ARB: Nephrology has not wanted to use a RAAi.                    Last Eye Exam: Referral to Dr Zadie Rhine in 08/2019.  Need to get record release next ov  Last A1c (05/22/20) = 10.4% which is significant increase from 6.5 to 7.5% last year. Weight up 5-7 lbs from when she was under glycemic control last summer.  She is taking Lantus 20 units daily and empagliflozin 25 mg daily.   Plan to start a prandial insulin. Humalog 2 units if completes at least half the largest meal of the day and keeps it down.  Will ask Dr [name] to alternate visits with me, addressing her glycemic control,

## 2020-07-15 NOTE — Assessment & Plan Note (Signed)
Established problem Not interested in cessation despite possible role in cyclic vomiting.

## 2020-07-17 ENCOUNTER — Encounter: Payer: Self-pay | Admitting: Physician Assistant

## 2020-07-17 ENCOUNTER — Ambulatory Visit (INDEPENDENT_AMBULATORY_CARE_PROVIDER_SITE_OTHER): Payer: 59 | Admitting: Physician Assistant

## 2020-07-17 ENCOUNTER — Other Ambulatory Visit: Payer: Self-pay

## 2020-07-17 DIAGNOSIS — R208 Other disturbances of skin sensation: Secondary | ICD-10-CM

## 2020-07-17 DIAGNOSIS — L281 Prurigo nodularis: Secondary | ICD-10-CM | POA: Diagnosis not present

## 2020-07-17 NOTE — Progress Notes (Signed)
   Follow up Visit  Subjective  [name] [name] is a 54 y.o. female who presents for the following: Follow-up (still feels like stuff is crawling on face & earst - tx  ivermectin, mupirocin  & clobetasol ). All of her pen sores have healed and all of the scabbed areas itch less except the one on her right outer calf. Her left ear is much better. However, she still feels like she has something crawling on her especially around her face and ears and scalp.  Objective  Well appearing patient in no apparent distress; mood and affect are within normal limits.  A focused examination was performed including face, arms, legs, back, chest. Relevant physical exam findings are noted in the Assessment and Plan.   Objective  Head - Anterior (Face), Right Lower Leg - Anterior: Lichenified patch  Assessment & Plan  Prurigo nodularis (2) Head - Anterior (Face); Right Lower Leg - Anterior  Use clobetasol it any itching areas.   Dysesthesia of face Head - Anterior (Face)  We will talk to her PCP to discuss an oral medication or her nerve sensations.

## 2020-07-22 ENCOUNTER — Other Ambulatory Visit: Payer: Self-pay | Admitting: Family Medicine

## 2020-07-22 ENCOUNTER — Other Ambulatory Visit: Payer: Self-pay

## 2020-07-22 DIAGNOSIS — E118 Type 2 diabetes mellitus with unspecified complications: Secondary | ICD-10-CM

## 2020-07-22 DIAGNOSIS — E1142 Type 2 diabetes mellitus with diabetic polyneuropathy: Secondary | ICD-10-CM

## 2020-07-22 DIAGNOSIS — R1115 Cyclical vomiting syndrome unrelated to migraine: Secondary | ICD-10-CM

## 2020-07-22 DIAGNOSIS — Z794 Long term (current) use of insulin: Secondary | ICD-10-CM

## 2020-07-22 MED ORDER — OXYCODONE HCL 5 MG PO TABS: 5.0000 mg | ORAL_TABLET | Freq: Four times a day (QID) | ORAL | 0 refills | Status: DC | PRN

## 2020-07-23 ENCOUNTER — Other Ambulatory Visit: Payer: Self-pay

## 2020-07-23 ENCOUNTER — Ambulatory Visit: Payer: 59

## 2020-07-23 ENCOUNTER — Ambulatory Visit
Admission: RE | Admit: 2020-07-23 | Discharge: 2020-07-23 | Disposition: A | Discharge status: Home / Self Care | Payer: 59 | Source: Ambulatory Visit | Attending: Family Medicine | Admitting: Family Medicine

## 2020-07-23 DIAGNOSIS — R928 Other abnormal and inconclusive findings on diagnostic imaging of breast: Secondary | ICD-10-CM

## 2020-07-28 ENCOUNTER — Telehealth: Payer: Self-pay | Admitting: *Deleted

## 2020-07-28 NOTE — Telephone Encounter (Signed)
[name] [name] from [facility] would like to discuss pt with PCP and talk about next steps for her (possible medication).  Please give her a call @ [phone]. Christen Bame, CMA

## 2020-07-29 NOTE — Telephone Encounter (Signed)
Phoned [facility] at request of [facility].  She was not in office.

## 2020-07-30 NOTE — Telephone Encounter (Signed)
Phoned [facility].  Lars Pinks PA was unable to come to phone.   I will attempt to call again at another time.

## 2020-08-06 ENCOUNTER — Other Ambulatory Visit: Payer: Self-pay | Admitting: Family Medicine

## 2020-08-07 ENCOUNTER — Telehealth: Payer: Self-pay | Admitting: Family Medicine

## 2020-08-07 NOTE — Telephone Encounter (Signed)
Patient is calling about script from pharmacy, patient is crying saying she is needing prescriptions filled, she states shes very sick in [place]. Thanks

## 2020-08-08 NOTE — Telephone Encounter (Signed)
LVM for patient to call back.  Saleen Peden,CMA

## 2020-08-08 NOTE — Telephone Encounter (Signed)
Pcp refilled script for lorazapam on 08/07/20 after patient called.  Shadee Rathod,CMA

## 2020-08-28 ENCOUNTER — Other Ambulatory Visit: Payer: Self-pay | Admitting: Family Medicine

## 2020-08-28 ENCOUNTER — Other Ambulatory Visit: Payer: Self-pay

## 2020-08-28 ENCOUNTER — Encounter: Payer: Self-pay | Admitting: Family Medicine

## 2020-08-28 ENCOUNTER — Ambulatory Visit (INDEPENDENT_AMBULATORY_CARE_PROVIDER_SITE_OTHER): Payer: 59 | Admitting: Family Medicine

## 2020-08-28 VITALS — BP 134/76 | HR 75 | Wt 154.0 lb

## 2020-08-28 DIAGNOSIS — R1115 Cyclical vomiting syndrome unrelated to migraine: Secondary | ICD-10-CM

## 2020-08-28 DIAGNOSIS — Z23 Encounter for immunization: Secondary | ICD-10-CM

## 2020-08-28 DIAGNOSIS — L989 Disorder of the skin and subcutaneous tissue, unspecified: Secondary | ICD-10-CM

## 2020-08-28 DIAGNOSIS — F39 Unspecified mood [affective] disorder: Secondary | ICD-10-CM | POA: Diagnosis not present

## 2020-08-28 DIAGNOSIS — E118 Type 2 diabetes mellitus with unspecified complications: Secondary | ICD-10-CM | POA: Diagnosis not present

## 2020-08-28 DIAGNOSIS — N184 Chronic kidney disease, stage 4 (severe): Secondary | ICD-10-CM

## 2020-08-28 DIAGNOSIS — E1143 Type 2 diabetes mellitus with diabetic autonomic (poly)neuropathy: Secondary | ICD-10-CM

## 2020-08-28 LAB — POCT GLYCOSYLATED HEMOGLOBIN (HGB A1C): HbA1c, POC (controlled diabetic range): 8.3 % — AB (ref 0.0–7.0)

## 2020-08-28 MED ORDER — BLOOD GLUCOSE MONITOR KIT: PACK | 0 refills | Status: DC

## 2020-08-28 MED ORDER — LORAZEPAM 1 MG PO TABS: 1.0000 mg | ORAL_TABLET | Freq: Four times a day (QID) | ORAL | 3 refills | Status: AC | PRN

## 2020-08-28 MED ORDER — OXYCODONE HCL 5 MG PO TABS: 5.0000 mg | ORAL_TABLET | Freq: Four times a day (QID) | ORAL | 0 refills | Status: DC | PRN

## 2020-08-28 NOTE — Patient Instructions (Addendum)
Change your Lantus to in the morning.  Once you have your glucometer working, add a second shot of Humalog 2 units with the second largest meal of the day.  Congratulation on improving your blood sugar a1c to 8.3%.

## 2020-08-29 ENCOUNTER — Encounter: Payer: Self-pay | Admitting: Family Medicine

## 2020-09-02 ENCOUNTER — Encounter: Payer: Self-pay | Admitting: Family Medicine

## 2020-09-02 ENCOUNTER — Telehealth: Payer: Self-pay | Admitting: *Deleted

## 2020-09-02 DIAGNOSIS — F39 Unspecified mood [affective] disorder: Secondary | ICD-10-CM | POA: Insufficient documentation

## 2020-09-02 NOTE — Assessment & Plan Note (Signed)
Established problem. Stable. Continue current medications and other regiments.  

## 2020-09-02 NOTE — Addendum Note (Signed)
Addended by: Valerie Roys on: 09/02/2020 10:27 AM   Modules accepted: Orders

## 2020-09-02 NOTE — Progress Notes (Signed)
[name] [name] is alone Sources of clinical information for visit is/are patient and past medical records. Nursing assessment for this office visit was reviewed with the patient for accuracy and revision.     Previous Report(s) Reviewed: lab reports and office notes, consult notes from dermatology Depression screen [facility] 2/9 08/28/2020  Decreased Interest 0  Down, Depressed, Hopeless 0  PHQ - 2 Score 0  Altered sleeping 0  Tired, decreased energy 1  Change in appetite 1  Feeling bad or failure about yourself  0  Trouble concentrating 0  Moving slowly or fidgety/restless 1  Suicidal thoughts 0  PHQ-9 Score 3  Difficult doing work/chores -  Some recent data might be hidden    Fall Risk  06/05/2020 11/16/2018 07/28/2018 01/05/2018 12/09/2017  Falls in the past year? 1 0 No No No  Number falls in past yr: 0 0 - - -  Injury with Fall? 1 0 - - -  Risk for fall due to : - - - - -  Risk for fall due to: Comment - - - - -    PHQ9 SCORE ONLY 08/28/2020 07/10/2020 06/05/2020  PHQ-9 Total Score 3 2 0    Adult vaccines due  Topic Date Due  . TETANUS/TDAP  09/29/2023  . PNEUMOCOCCAL POLYSACCHARIDE VACCINE AGE 15-64 HIGH RISK  Completed    Health Maintenance Due  Topic Date Due  . Hepatitis C Screening  Never done  . OPHTHALMOLOGY EXAM  07/08/2017      History/P.E. limitations: none  Adult vaccines due  Topic Date Due  . TETANUS/TDAP  09/29/2023  . PNEUMOCOCCAL POLYSACCHARIDE VACCINE AGE 15-64 HIGH RISK  Completed    Diabetes Health Maintenance Due  Topic Date Due  . OPHTHALMOLOGY EXAM  07/08/2017  . HEMOGLOBIN A1C  02/25/2021  . FOOT EXAM  07/10/2021    Health Maintenance Due  Topic Date Due  . Hepatitis C Screening  Never done  . OPHTHALMOLOGY EXAM  07/08/2017     Chief Complaint  Patient presents with  . Diabetes  . Abdominal Pain

## 2020-09-02 NOTE — Addendum Note (Signed)
Addended byLissa Morales D on: 09/02/2020 09:37 AM   Modules accepted: Orders

## 2020-09-02 NOTE — Assessment & Plan Note (Deleted)
Autonomic neuropathy

## 2020-09-02 NOTE — Addendum Note (Signed)
Addended byWendy Poet, Adlene Adduci D on: 09/02/2020 09:26 AM   Modules accepted: Orders

## 2020-09-02 NOTE — Assessment & Plan Note (Signed)
Established problem. Stable. No restart of psychotropic medication. Stop Seroquel as tactile hallucination of parasitosis has decreased significantly in intensity.

## 2020-09-02 NOTE — Assessment & Plan Note (Signed)
Established problem that has improved.  Patient reporting persistence of tactile hallucination on face/ears/lower arms but not scratching very much. One subcentimeter skin wound arm only.  Plan to stop Seroquel and monitor for recurrence of severe symptoms.

## 2020-09-02 NOTE — Assessment & Plan Note (Signed)
Established problem. Stable. No restart of psychotropic medications

## 2020-09-02 NOTE — Chronic Care Management (AMB) (Signed)
   Care Management   Outreach Note  09/02/2020 Name: [name] [name] MRN: [id number] DOB: Aug 12, 1966  [name] [name] is a 54 y.o. year old female who is a primary care patient of McDiarmid, Blane Ohara, MD. I reached out to [name] by phone today in response to a referral sent by Ms. [place] PCP, McDiarmid, Blane Ohara, MD.      An unsuccessful telephone outreach was attempted today. The patient was referred to the case management team for assistance with care management and care coordination.   Follow Up Plan: A HIPPA compliant phone message was left for the patient providing contact information and requesting a return call. The care management team will reach out to the patient again over the next 7 days. If patient returns call to provider office, please advise to call [place] at [phone].  [place] [facility]

## 2020-09-02 NOTE — Assessment & Plan Note (Signed)
Established problem [name] [name] expressed surprise and unhappiness to learn from a presumably an insurance company chronic disease management healthcare provider that she has stage IV Chronic Kidney Disease.  We discussed means of slowing progression, including recommendation that [name] [name] contact Dr Nicholos Johns (Nutrition) at [facility] to discuss dietary advice about salt, phosphate, potassium and protein intake.   Request to [facility] for latest Labs and office visit notes. Pt needs q3-6 month check Ca, PO4, 25(OH) Vit D and q 6-12 month iPTH  [employer] recommends adults with CKD and DM offer regular podiatry assessment.

## 2020-09-04 NOTE — Chronic Care Management (AMB) (Signed)
  Care Management   Note  09/04/2020 Name: [name] [name] MRN: [id number] DOB: [birth year]  [name] [name] is a 55 y.o. year old female who is a primary care patient of McDiarmid, Blane Ohara, MD. I reached out to [name] by phone today in response to a referral sent by [name] [name]'s health plan.    [name] [name] was given information about care management services today including:  1. Care management services include personalized support from designated clinical staff supervised by her physician, including individualized plan of care and coordination with other care providers 2. 24/7 contact phone numbers for assistance for urgent and routine care needs. 3. The patient may stop care management services at any time by phone call to the office staff.  Patient agreed to services and verbal consent obtained.   Follow up plan: Telephone appointment with care management team member scheduled for:09/11/2020  [place] [facility]

## 2020-09-05 ENCOUNTER — Telehealth: Payer: Self-pay

## 2020-09-05 NOTE — Telephone Encounter (Signed)
[facility] pharmacy recommending patient be put on a STATIN. Recommended medication are:   ATORVASTAIN , ROSUVASTATIN, FLUVASTATIN, LOVASTATIN, PITAVASTATIN, PRAVASTATIN, SIMVASTATIN  If you want this patient to start a STATIN medication please state prescribing requirements and send to pharmacy.

## 2020-09-11 ENCOUNTER — Ambulatory Visit: Payer: 59 | Admitting: Licensed Clinical Social Worker

## 2020-09-11 ENCOUNTER — Telehealth: Payer: Self-pay | Admitting: Licensed Clinical Social Worker

## 2020-09-11 DIAGNOSIS — F439 Reaction to severe stress, unspecified: Secondary | ICD-10-CM

## 2020-09-11 NOTE — Chronic Care Management (AMB) (Signed)
Care Management   Clinical Social Work Follow Up   09/11/2020 Name: [name] [name] MRN: [id number] DOB: 08/14/1966 Referred by: McDiarmid, Blane Ohara, MD  Reason for referral : Care Coordination (managing stressors)  [name] [name] is a 54 y.o. year old female who is a primary care patient of McDiarmid, Blane Ohara, MD.  Assessment: Patient continues to experience symptom of stress which seems to be exacerbated by concerns with chronic medical condition. Patient reports not feeling well today and unable to complete assessment of needs Plan: Patient will call LCSW back next week when she feels better Advance Directive Status: ; not addressed during this encounter.  SDOH (Social Determinants of Health) assessments performed:  No needs identified   Goals Addressed            This Visit's Progress   . managing stress       CARE PLAN ENTRY (see longitudinal plan of care for additional care plan information)  Current Barriers:  . Patient with chronic medical conditions acknowledges deficits with managing stressor.  . Patient needs Support, Education, and Care Coordination in order to meet unmet mental health needs  . Patient does not want to establish with a counselor at this time would like to work with [place] Work Goal(s):  Marland Kitchen Over the next 30 days, patient will work with SW by telephone manage symptoms of stress   Interventions:  . Assessed patient's  previous treatment, needs and barriers to care . Discussed several options for long term counseling based on need and insurance.  . Other interventions include: Emotional/Supportive Counseling  Patient Self Care Activities & Deficits:  . Patient is unable to independently navigate community resource options without care coordination support . Patient is motivated for treatment Initial goal documentation       Outpatient Encounter Medications as of 09/11/2020  Medication Sig Note  . ACCU-CHEK AVIVA PLUS test strip USE AS  DIRECTED   . Accu-Chek Softclix Lancets lancets USE AS DIRECTED UP TO FOUR TIMES DAILY.   . blood glucose meter kit and supplies KIT Dispense based on patient and insurance preference. Use up to four times daily as directed. (FOR ICD-9 250.00, 250.01).   . carvedilol (COREG) 6.25 MG tablet TAKE 1 TABLET BY MOUTH TWICE DAILY WITH MEALS   . Diclofenac Sodium-Menthol 1.5 & 10 % THPK Apply 4 g topically 4 (four) times daily.   . empagliflozin (JARDIANCE) 25 MG TABS tablet Take 25 mg by mouth daily.   . insulin lispro (HUMALOG) 100 UNIT/ML KwikPen Inject 2 units subcutaneous only after eating at least half of your dinner each day.   . Insulin Pen Needle (B-D UF III MINI PEN NEEDLES) 31G X 5 MM MISC CHECK BLOOD GLUCOSE THREE TIMES DAILY AS DIRECTED   . LANTUS SOLOSTAR 100 UNIT/ML Solostar Pen ADMINISTER 20 UNITS UNDER THE SKIN DAILY   . LORazepam (ATIVAN) 1 MG tablet Take 1 tablet (1 mg total) by mouth every 6 (six) hours as needed for anxiety.   Marland Kitchen lubiprostone (AMITIZA) 24 MCG capsule TAKE 1 CAPSULE(24 MCG) BY MOUTH TWICE DAILY WITH A MEAL (Patient taking differently: Take 24 mcg by mouth 2 (two) times daily with a meal. ) 09/20/2019: LF 8.8.20 45DS  . mupirocin ointment (BACTROBAN) 2 % Apply 1 application topically daily.   Marland Kitchen oxyCODONE (OXY IR/ROXICODONE) 5 MG immediate release tablet Take 1 tablet (5 mg total) by mouth every 6 (six) hours as needed for moderate pain or severe pain.   Marland Kitchen  promethazine (PHENERGAN) 12.5 MG tablet TAKE 1 TABLET(12.5 MG) BY MOUTH EVERY 6 HOURS AS NEEDED FOR NAUSEA OR VOMITING   . promethazine (PHENERGAN) 25 MG suppository Place 1 suppository (25 mg total) rectally every 6 (six) hours as needed for nausea or vomiting.   . senna (SENOKOT) 8.6 MG TABS tablet Take 2 tablets (17.2 mg total) by mouth daily as needed for moderate constipation.    No facility-administered encounter medications on file as of 09/11/2020.   Review of patient status, including review of consultants  reports, relevant laboratory and other test results, and collaboration with appropriate care team members and the patient's provider was performed as part of comprehensive patient evaluation and provision of care management services.    [name], [place] / [place]   [phone] 10:56 AM

## 2020-09-11 NOTE — Chronic Care Management (AMB) (Signed)
    Clinical Social Work  Care Management Outreach   09/11/2020 Name: MYLIE MCCURLEY MRN: [id number] DOB: Jul 09, 1966  AVALIN BRILEY is a 54 y.o. year old female who is a primary care patient of McDiarmid, Blane Ohara, MD .  The Care Management team was consulted by patient for assistance with managing stressors.   LCSW reached out to [name] today by phone to assess needs and offer Care Management interventions.  The outreach was unsuccessful.  A HIPPA compliant phone message was left for the patient providing contact information and requesting a return call.   Plan: LCSW will wait for return call.  Review of patient status, including review of consultants reports, relevant laboratory and other test results, and collaboration with appropriate care team members and the patient's provider was performed as part of comprehensive patient evaluation and provision of care management services.    [name], [place] / [place]   [phone] 9:06 AM

## 2020-09-23 ENCOUNTER — Other Ambulatory Visit: Payer: Self-pay

## 2020-09-23 DIAGNOSIS — R1115 Cyclical vomiting syndrome unrelated to migraine: Secondary | ICD-10-CM

## 2020-09-23 MED ORDER — OXYCODONE HCL 5 MG PO TABS: 5.0000 mg | ORAL_TABLET | Freq: Four times a day (QID) | ORAL | 0 refills | Status: DC | PRN

## 2020-09-26 ENCOUNTER — Ambulatory Visit: Payer: 59 | Admitting: Licensed Clinical Social Worker

## 2020-09-26 DIAGNOSIS — F439 Reaction to severe stress, unspecified: Secondary | ICD-10-CM

## 2020-09-26 NOTE — Chronic Care Management (AMB) (Signed)
Care Management   Clinical Social Work initial Note  09/26/2020 Name: [name] [name] MRN: [id number] DOB: 06-26-1966 [name] [name] is a 54 y.o. year old female who sees McDiarmid, Blane Ohara, MD for primary care. The Care Management team was consulted to assist the patient with managing stressors.  LCSW reached out to [name] today by phone to assess needs and barriers to care.    Assessment: Patient is pleasant and engaged in conversation.  Experiencing difficulty with managing stressors which seems to be exacerbated by chronic health condition, extended family dynamics and increased isolation with COVID.        Recommendation: Patient may benefit from, ongoing counseling however is not in agreement at this time.   Plan:  1. LCSW will provide brief interventions to assist patient to manage stressors  2.   F/U call scheduled in 1 -week  Review of patient status, including review of consultants reports, relevant laboratory and other test results, and collaboration with appropriate care team members and the patient's provider was performed as part of comprehensive patient evaluation and provision of chronic care management services.    Advance Directive Status: Not addressed in this encounter SDOH (Social Determinants of Health) assessments performed: Yes: SDOH Interventions     Most Recent Value  SDOH Interventions  Stress Interventions Provide Counseling      ; Goals Addressed            This Visit's Progress   . Manage My Stress       Follow Up Date 10/03/2020    - check out volunteer opportunities  - practice positive thinking and self-talk    Why is this important?   When you are stressed, down or upset, your body reacts too.  For example, your blood pressure may get higher; you may have a headache or stomachache.  When your emotions get the best of you, your body's ability to fight off cold and flu gets weak.  These steps will help you manage your emotions.       Notes:  Clinical Social Work Interventions: Marland Kitchen Motivational Interviewing; Veterinary surgeon; Teaching/Coaching Strategies      . COMPLETED: managing stress       CARE PLAN ENTRY (see longitudinal plan of care for additional care plan information)  Current Barriers:  . Patient with chronic medical conditions acknowledges deficits with managing stressor.  . Patient needs Support, Education, and Care Coordination in order to meet unmet mental health needs  . Patient does not want to establish with a counselor at this time would like to work with [place] Work Goal(s):  Marland Kitchen Over the next 30 days, patient will work with SW by telephone manage symptoms of stress   Interventions:  . Assessed patient's  previous treatment, needs and barriers to care . Discussed several options for long term counseling based on need and insurance.  . Other interventions include: Emotional/Supportive Counseling  Patient Self Care Activities & Deficits:  . Patient is unable to independently navigate community resource options without care coordination support . Patient is motivated for treatment Completed goal: Resolving due to duplicate goal Initial goal documentation       Outpatient Encounter Medications as of 09/26/2020  Medication Sig Note  . ACCU-CHEK AVIVA PLUS test strip USE AS DIRECTED   . Accu-Chek Softclix Lancets lancets USE AS DIRECTED UP TO FOUR TIMES DAILY.   . blood glucose meter kit and supplies KIT Dispense based on patient and insurance preference. Use  up to four times daily as directed. (FOR ICD-9 250.00, 250.01).   . carvedilol (COREG) 6.25 MG tablet TAKE 1 TABLET BY MOUTH TWICE DAILY WITH MEALS   . Diclofenac Sodium-Menthol 1.5 & 10 % THPK Apply 4 g topically 4 (four) times daily.   . empagliflozin (JARDIANCE) 25 MG TABS tablet Take 25 mg by mouth daily.   . insulin lispro (HUMALOG) 100 UNIT/ML KwikPen Inject 2 units subcutaneous only after eating at least half of your dinner  each day.   . Insulin Pen Needle (B-D UF III MINI PEN NEEDLES) 31G X 5 MM MISC CHECK BLOOD GLUCOSE THREE TIMES DAILY AS DIRECTED   . LANTUS SOLOSTAR 100 UNIT/ML Solostar Pen ADMINISTER 20 UNITS UNDER THE SKIN DAILY   . LORazepam (ATIVAN) 1 MG tablet Take 1 tablet (1 mg total) by mouth every 6 (six) hours as needed for anxiety.   Marland Kitchen lubiprostone (AMITIZA) 24 MCG capsule TAKE 1 CAPSULE(24 MCG) BY MOUTH TWICE DAILY WITH A MEAL (Patient taking differently: Take 24 mcg by mouth 2 (two) times daily with a meal. ) 09/20/2019: LF 8.8.20 45DS  . mupirocin ointment (BACTROBAN) 2 % Apply 1 application topically daily.   Marland Kitchen oxyCODONE (OXY IR/ROXICODONE) 5 MG immediate release tablet Take 1 tablet (5 mg total) by mouth every 6 (six) hours as needed for moderate pain or severe pain.   . promethazine (PHENERGAN) 12.5 MG tablet TAKE 1 TABLET(12.5 MG) BY MOUTH EVERY 6 HOURS AS NEEDED FOR NAUSEA OR VOMITING   . promethazine (PHENERGAN) 25 MG suppository Place 1 suppository (25 mg total) rectally every 6 (six) hours as needed for nausea or vomiting.   . senna (SENOKOT) 8.6 MG TABS tablet Take 2 tablets (17.2 mg total) by mouth daily as needed for moderate constipation.    No facility-administered encounter medications on file as of 09/26/2020.      [name], [place] / [place]   [phone] 1:19 PM

## 2020-09-26 NOTE — Patient Instructions (Signed)
Licensed Clinical Social Worker Visit Information Ms. Prehn  it was nice speaking with you. Please call me directly if you have questions [phone] Goals we discussed today:  Goals Addressed            This Visit's Progress   . Manage My Stress       Follow Up Date 10/03/2020    - check out volunteer opportunities  - practice positive thinking and self-talk    Why is this important?   When you are stressed, down or upset, your body reacts too.  For example, your blood pressure may get higher; you may have a headache or stomachache.  When your emotions get the best of you, your body's ability to fight off cold and flu gets weak.  These steps will help you manage your emotions.     Notes:  Clinical Social Work Interventions: Marland Kitchen Motivational Interviewing; Veterinary surgeon; Teaching/Coaching Strategies                  Ms. Guhl received Care Management services today:  1. Care Management services include personalized support from designated clinical staff supervised by her physician, including individualized plan of care and coordination with other care providers 2. 24/7 contact [phone] for assistance for urgent and routine care needs. 3. Care Management are voluntary services and be declined at any time by calling the office.  Follow up plan:  SW will follow up with patient by phone over the next week  Maurine Cane, LCSW

## 2020-10-03 ENCOUNTER — Ambulatory Visit: Payer: 59 | Admitting: Licensed Clinical Social Worker

## 2020-10-03 DIAGNOSIS — F439 Reaction to severe stress, unspecified: Secondary | ICD-10-CM

## 2020-10-03 DIAGNOSIS — R4589 Other symptoms and signs involving emotional state: Secondary | ICD-10-CM

## 2020-10-03 NOTE — Patient Instructions (Signed)
  [name] [name]  it was nice speaking with you. Please call me directly [phone] if you have questions about the goals we discussed. Goals Addressed            This Visit's Progress   . Manage My Stress   On track    Follow Up Date 10/09/2020    - check out volunteer opportunities  - practice positive thinking and self-talk  - eat breakfast    Why is this important?   When you are stressed, down or upset, your body reacts too.  For example, your blood pressure may get higher; you may have a headache or stomachache.  When your emotions get the best of you, your body's ability to fight off cold and flu gets weak.  These steps will help you manage your emotions.      [name] [name] received Care Management services today:  1. Care Management services include personalized support from designated clinical staff supervised by her physician, including individualized plan of care and coordination with other care providers 2. 24/7 contact [phone] for assistance for urgent and routine care needs. 3. Care Management are voluntary services and be declined at any time by calling the office.  Patient verbalizes understanding of instructions provided today.  Follow up plan: SW will follow up with patient by phone over the next week  Maurine Cane, LCSW

## 2020-10-03 NOTE — Chronic Care Management (AMB) (Signed)
Care Management   Clinical Social Work Follow Up   10/03/2020 Name: [name] [name] MRN: [id number] DOB: 1966/07/22 Referred by: McDiarmid, Blane Ohara, MD  Reason for referral : Care Coordination (F/U)  [name] [name] is a 54 y.o. year old female who is a primary care patient of McDiarmid, Blane Ohara, MD.  Reason for follow-up: assess for barriers and progress with managing stress .    Assessment: Patient is making progress towards goal. States she is eating breakfast and implemented positive affirmations .  Patient Care Plan: General Plan of Care (Adult)    Problem Identified: Coping Skills (General Plan of Care)     Long-Range Goal: Coping Skills Enhanced   Interventions:   Evidence-based guidance:  Acknowledge, normalize and validate difficulty of making life-long lifestyle changes.  Identify current effective and ineffective coping strategies.  Consider alternative and complementary therapy approaches such as meditation, mindfulness   Encourage participation in cognitive behavioral therapy to foster a positive identity, increase self-awareness, as well as boost self-esteem, confidence and self-efficacy.  Discuss spirituality; be present as concerns are identified; encourage journaling, prayer, worship services, meditation or pastoral counseling.  Encourage participation in pleasurable group activities such as hobbies or volunteering).  Encourage the use of mindfulness; .    Review of patient status, including review of consultants reports, relevant laboratory and other test results, and collaboration with appropriate care team members and the patient's provider was performed as part of comprehensive patient evaluation and provision of care management services.   Plan: Patient would like continued follow-up. LCSW will F/U with patient in 5 to 7 days SDOH (Social Determinants of Health) assessments performed:; No needs identified   Goals Addressed            This Visit's Progress   .  Manage My Stress   On track    Follow Up Date 10/09/2020    - check out volunteer opportunities  - practice positive thinking and self-talk  - eat breakfast    Why is this important?   When you are stressed, down or upset, your body reacts too.  For example, your blood pressure may get higher; you may have a headache or stomachache.  When your emotions get the best of you, your body's ability to fight off cold and flu gets weak.  These steps will help you manage your emotions.      Outpatient Encounter Medications as of 10/03/2020  Medication Sig Note  . ACCU-CHEK AVIVA PLUS test strip USE AS DIRECTED   . Accu-Chek Softclix Lancets lancets USE AS DIRECTED UP TO FOUR TIMES DAILY.   . blood glucose meter kit and supplies KIT Dispense based on patient and insurance preference. Use up to four times daily as directed. (FOR ICD-9 250.00, 250.01).   . carvedilol (COREG) 6.25 MG tablet TAKE 1 TABLET BY MOUTH TWICE DAILY WITH MEALS   . Diclofenac Sodium-Menthol 1.5 & 10 % THPK Apply 4 g topically 4 (four) times daily.   . empagliflozin (JARDIANCE) 25 MG TABS tablet Take 25 mg by mouth daily.   . insulin lispro (HUMALOG) 100 UNIT/ML KwikPen Inject 2 units subcutaneous only after eating at least half of your dinner each day.   . Insulin Pen Needle (B-D UF III MINI PEN NEEDLES) 31G X 5 MM MISC CHECK BLOOD GLUCOSE THREE TIMES DAILY AS DIRECTED   . LANTUS SOLOSTAR 100 UNIT/ML Solostar Pen ADMINISTER 20 UNITS UNDER THE SKIN DAILY   . lubiprostone (AMITIZA) 24 MCG capsule TAKE 1  CAPSULE(24 MCG) BY MOUTH TWICE DAILY WITH A MEAL (Patient taking differently: Take 24 mcg by mouth 2 (two) times daily with a meal. ) 09/20/2019: LF 8.8.20 45DS  . mupirocin ointment (BACTROBAN) 2 % Apply 1 application topically daily.   Marland Kitchen oxyCODONE (OXY IR/ROXICODONE) 5 MG immediate release tablet Take 1 tablet (5 mg total) by mouth every 6 (six) hours as needed for moderate pain or severe pain.   . promethazine (PHENERGAN) 12.5  MG tablet TAKE 1 TABLET(12.5 MG) BY MOUTH EVERY 6 HOURS AS NEEDED FOR NAUSEA OR VOMITING   . promethazine (PHENERGAN) 25 MG suppository Place 1 suppository (25 mg total) rectally every 6 (six) hours as needed for nausea or vomiting.   . senna (SENOKOT) 8.6 MG TABS tablet Take 2 tablets (17.2 mg total) by mouth daily as needed for moderate constipation.    No facility-administered encounter medications on file as of 10/03/2020.   [name], [place] / [place]   [phone] 12:10 PM

## 2020-10-09 ENCOUNTER — Ambulatory Visit: Payer: 59 | Admitting: Licensed Clinical Social Worker

## 2020-10-09 DIAGNOSIS — F439 Reaction to severe stress, unspecified: Secondary | ICD-10-CM

## 2020-10-09 NOTE — Chronic Care Management (AMB) (Signed)
Care Management   Clinical Social Work Follow Up   10/09/2020 Name: [name] [name] MRN: [id number] DOB: 1966/10/06 Referred by: McDiarmid, Blane Ohara, MD  Reason for referral : No chief complaint on file.  [name] [name] is a 54 y.o. year old female who is a primary care patient of McDiarmid, Blane Ohara, MD.  Reason for follow-up: assess for barriers and progress with managing stressors .    Assessment:  Patient is making progress towards goal. States she is starting to feel better and having more energy to do things.  Patient has implemented most interventions discussed during last encounter ( eating breakfast and positive affirmations.  She reports concerns today with her skin.  States something is crawling on her skin and biting her.  Have contacted an exterminator to have her home treated but still thinks something is on her skin. Recommendation: Patient may benefit from, and is in agreement to make an appointment to see PCP to discuss concerns further. Will also see if she can put the bug in clear bag so that she can see them.   Interventions: . Collaborated with primary care provider re: to provide update of patient's concerns* . Motivational Interviewing; Veterinary surgeon; Emotional/Supportive Counseling;Problem Solving;Teaching/Coaching Strategies   Review of patient status, including review of consultants reports, relevant laboratory and other test results, and collaboration with appropriate care team members and the patient's provider was performed as part of comprehensive patient evaluation and provision of care management services.   Plan: Patient would like continued follow-up. LCSW will f/u with patient in 2 weeks  Advance Directive Status: ; not addressed during this encounter.  SDOH (Social Determinants of Health) assessments performed:; No needs identified   Goals Addressed            This Visit's Progress   . Manage My Stress       Follow Up Date 10/24/2020   -  continue with positive thinking and self-talk ( see it, write it, speak it, believe it) - continue with eating breakfast  - start self-care plan we discussed today   Why is this important?   When you are stressed, down or upset, your body reacts too.  For example, your blood pressure may get higher; you may have a headache or stomachache.  When your emotions get the best of you, your body's ability to fight off cold and flu gets weak.  These steps will help you manage your emotions.      Outpatient Encounter Medications as of 10/09/2020  Medication Sig Note  . ACCU-CHEK AVIVA PLUS test strip USE AS DIRECTED   . Accu-Chek Softclix Lancets lancets USE AS DIRECTED UP TO FOUR TIMES DAILY.   . blood glucose meter kit and supplies KIT Dispense based on patient and insurance preference. Use up to four times daily as directed. (FOR ICD-9 250.00, 250.01).   . carvedilol (COREG) 6.25 MG tablet TAKE 1 TABLET BY MOUTH TWICE DAILY WITH MEALS   . Diclofenac Sodium-Menthol 1.5 & 10 % THPK Apply 4 g topically 4 (four) times daily.   . empagliflozin (JARDIANCE) 25 MG TABS tablet Take 25 mg by mouth daily.   . insulin lispro (HUMALOG) 100 UNIT/ML KwikPen Inject 2 units subcutaneous only after eating at least half of your dinner each day.   . Insulin Pen Needle (B-D UF III MINI PEN NEEDLES) 31G X 5 MM MISC CHECK BLOOD GLUCOSE THREE TIMES DAILY AS DIRECTED   . LANTUS SOLOSTAR 100 UNIT/ML Solostar Pen ADMINISTER 20  UNITS UNDER THE SKIN DAILY   . lubiprostone (AMITIZA) 24 MCG capsule TAKE 1 CAPSULE(24 MCG) BY MOUTH TWICE DAILY WITH A MEAL (Patient taking differently: Take 24 mcg by mouth 2 (two) times daily with a meal. ) 09/20/2019: LF 8.8.20 45DS  . mupirocin ointment (BACTROBAN) 2 % Apply 1 application topically daily.   Marland Kitchen oxyCODONE (OXY IR/ROXICODONE) 5 MG immediate release tablet Take 1 tablet (5 mg total) by mouth every 6 (six) hours as needed for moderate pain or severe pain.   . promethazine (PHENERGAN)  12.5 MG tablet TAKE 1 TABLET(12.5 MG) BY MOUTH EVERY 6 HOURS AS NEEDED FOR NAUSEA OR VOMITING   . promethazine (PHENERGAN) 25 MG suppository Place 1 suppository (25 mg total) rectally every 6 (six) hours as needed for nausea or vomiting.   . senna (SENOKOT) 8.6 MG TABS tablet Take 2 tablets (17.2 mg total) by mouth daily as needed for moderate constipation.    No facility-administered encounter medications on file as of 10/09/2020.   [name], [place] / [place]   [phone] 3:39 PM

## 2020-10-09 NOTE — Patient Instructions (Signed)
  Ms. Rothery  it was nice speaking with you. Please call me directly [phone] if you have questions about the goals we discussed. Goals Addressed            This Visit's Progress   . Manage My Stress       Follow Up Date 10/24/2020   - continue with positive thinking and self-talk ( see it, write it, speak it, believe it) - continue eating breakfast  - start self-care plan we discussed today ( things you enjoy ie wearing perfume)    Why is this important?   When you are stressed, down or upset, your body reacts too.  For example, your blood pressure may get higher; you may have a headache or stomachache.  When your emotions get the best of you, your body's ability to fight off cold and flu gets weak.  These steps will help you manage your emotions.      Ms. Bodiford received Care Management services today:  1. Care Management services include personalized support from designated clinical staff supervised by her physician, including individualized plan of care and coordination with other care providers 2. 24/7 contact [phone] for assistance for urgent and routine care needs. 3. Care Management are voluntary services and be declined at any time by calling the office.  Patient verbalizes understanding of instructions provided today.  Follow up plan: SW will follow up with patient by phone over the next 2 weeks  Maurine Cane, LCSW

## 2020-10-15 ENCOUNTER — Other Ambulatory Visit: Payer: Self-pay

## 2020-10-15 ENCOUNTER — Ambulatory Visit (INDEPENDENT_AMBULATORY_CARE_PROVIDER_SITE_OTHER): Payer: 59 | Admitting: Family Medicine

## 2020-10-15 ENCOUNTER — Telehealth: Payer: Self-pay | Admitting: Family Medicine

## 2020-10-15 DIAGNOSIS — L989 Disorder of the skin and subcutaneous tissue, unspecified: Secondary | ICD-10-CM

## 2020-10-15 MED ORDER — QUETIAPINE FUMARATE 50 MG PO TABS: 50.0000 mg | ORAL_TABLET | Freq: Every day | ORAL | 1 refills | Status: DC

## 2020-10-15 NOTE — Telephone Encounter (Signed)
Spoke with her.  Recommend restart seroquel and she agrees.  Sent in new Rx.  She has some 100 mg tabs at home and will start with 1/2 of these tonight

## 2020-10-15 NOTE — Assessment & Plan Note (Signed)
Worsened.  Consistent with delusions of parasitosis  Will restart seroquel and see after visit summary for local measures.    Discussed with Dr McDiarmid

## 2020-10-15 NOTE — Patient Instructions (Signed)
Good to see you today!  Thanks for coming in.  You can use Lever 2000 as a soap once a day  Whenever you feel something is on your skin use a dollop of vaseline and put on it.  Do not use anything else  I will talk with Dr McDiarmid about restarting Seroquel and call you

## 2020-10-15 NOTE — Progress Notes (Signed)
    SUBJECTIVE:   CHIEF COMPLAINT / HPI:   SKIN LESIONS Has lesions on her L ear and back that she feels are bugs biting her.  Brings in pieces of dark material in a bag that she feels are bugs.  Has been boiling all her clothes and sheets every day.  Using lots of different OTC bug preparations.  None under her arms or groin or on her feet.  No oral lesion  PERTINENT  PMH / PSH: history of this getting worse when stopped her seroquel  OBJECTIVE:   BP (!) 162/80   Pulse 71   Wt 149 lb 9.6 oz (67.9 kg)   SpO2 99%   BMI 27.36 kg/m       Excoriations scattered on upper back and in L pinna.   MM - no lesions No arthropods seen   ASSESSMENT/PLAN:   Skin disorder Worsened.  Consistent with delusions of parasitosis  Will restart seroquel and see after visit summary for local measures.    Discussed with Dr McDiarmid      Lind Covert, [place]

## 2020-10-20 ENCOUNTER — Other Ambulatory Visit: Payer: Self-pay

## 2020-10-20 DIAGNOSIS — R1115 Cyclical vomiting syndrome unrelated to migraine: Secondary | ICD-10-CM

## 2020-10-20 NOTE — Telephone Encounter (Signed)
Patient calls nurse line requesting a refill on pain medication and ativan. Patient reports she has been paying out of pocket for ativan due to the way it is written. Patient reports in order for her insurance to cover the medication needs to be written for a 9 day supply. Please advise.

## 2020-10-22 MED ORDER — OXYCODONE HCL 5 MG PO TABS: 5.0000 mg | ORAL_TABLET | Freq: Four times a day (QID) | ORAL | 0 refills | Status: DC | PRN

## 2020-10-24 ENCOUNTER — Ambulatory Visit: Payer: 59 | Admitting: Licensed Clinical Social Worker

## 2020-10-24 DIAGNOSIS — F439 Reaction to severe stress, unspecified: Secondary | ICD-10-CM

## 2020-10-24 DIAGNOSIS — Z789 Other specified health status: Secondary | ICD-10-CM

## 2020-10-24 NOTE — Chronic Care Management (AMB) (Addendum)
Care Management   Clinical Social Work Follow Up   10/24/2020 Name: [name] [name] MRN: [id number] DOB: 1966-04-11 Referred by: McDiarmid, Blane Ohara, MD  Reason for referral : Care Coordination and Stress  [name] [name] is a 54 y.o. year old female who is a primary care patient of McDiarmid, Blane Ohara, MD.  Reason for follow-up: assess for barriers and progress with managing stress .  Assessment: Patient is making progress towards goal. States starting to feel better.  Patient inquired about Christmas support from community for her grandchildren.  CCM community referral placed.   Plan: Patient would like continued follow-up. LCSW will f/u with patient in 3 weeks   Advance Directive Status:  not addressed during this encounter.  SDOH (Social Determinants of Health) assessments performed:No new needs identified   Patient Care Plan: Manage stressors  Problem Identified: coping skills   Priority: Medium  Onset Date: 09/11/2020  Goal: Coping Skills Enhanced   Start Date: 09/11/2020  Expected End Date: 11/25/2020  This Visit's Progress: On track  Priority: Medium  Current Barriers: Patient is experiencing symptoms of stress and anxiety which seem to be exacerbated by chronic medical condition and family dynamics.     Clinical Goal(s): Over the next 60 days, patient will work with SW to reduce or manage symptoms of stress  Interventions:  . Assessed patient's previous treatment, needs, coping, current treatment, support and barriers to care . Provided basic mental health support, education and interventions (EMMI education: [place] and Relieving Stress) . active listening utilized . counseling provided . current coping strategies identified . healthy lifestyle promoted . self-reflection promoted Patient Activities:  . implement interventions discussed today to decreases symptoms of stress and increase knowledge and/or ability of: coping skills. . implement clinical  interventions discussed today  . Review EMMI education: Movement /Emotional Health and Relieving Stress . Explore volunteer options    Outpatient Encounter Medications as of 10/24/2020  Medication Sig Note  . ACCU-CHEK AVIVA PLUS test strip USE AS DIRECTED   . Accu-Chek Softclix Lancets lancets USE AS DIRECTED UP TO FOUR TIMES DAILY.   . blood glucose meter kit and supplies KIT Dispense based on patient and insurance preference. Use up to four times daily as directed. (FOR ICD-9 250.00, 250.01).   . carvedilol (COREG) 6.25 MG tablet TAKE 1 TABLET BY MOUTH TWICE DAILY WITH MEALS   . Diclofenac Sodium-Menthol 1.5 & 10 % THPK Apply 4 g topically 4 (four) times daily.   . empagliflozin (JARDIANCE) 25 MG TABS tablet Take 25 mg by mouth daily.   . insulin lispro (HUMALOG) 100 UNIT/ML KwikPen Inject 2 units subcutaneous only after eating at least half of your dinner each day.   . Insulin Pen Needle (B-D UF III MINI PEN NEEDLES) 31G X 5 MM MISC CHECK BLOOD GLUCOSE THREE TIMES DAILY AS DIRECTED   . LANTUS SOLOSTAR 100 UNIT/ML Solostar Pen ADMINISTER 20 UNITS UNDER THE SKIN DAILY   . lubiprostone (AMITIZA) 24 MCG capsule TAKE 1 CAPSULE(24 MCG) BY MOUTH TWICE DAILY WITH A MEAL (Patient taking differently: Take 24 mcg by mouth 2 (two) times daily with a meal. ) 09/20/2019: LF 8.8.20 45DS  . mupirocin ointment (BACTROBAN) 2 % Apply 1 application topically daily.   Marland Kitchen oxyCODONE (OXY IR/ROXICODONE) 5 MG immediate release tablet Take 1 tablet (5 mg total) by mouth every 6 (six) hours as needed for moderate pain or severe pain.   . promethazine (PHENERGAN) 12.5 MG tablet TAKE 1 TABLET(12.5 MG) BY  MOUTH EVERY 6 HOURS AS NEEDED FOR NAUSEA OR VOMITING   . promethazine (PHENERGAN) 25 MG suppository Place 1 suppository (25 mg total) rectally every 6 (six) hours as needed for nausea or vomiting.   Marland Kitchen QUEtiapine (SEROQUEL) 50 MG tablet Take 1 tablet (50 mg total) by mouth at bedtime.   . senna (SENOKOT) 8.6 MG TABS tablet  Take 2 tablets (17.2 mg total) by mouth daily as needed for moderate constipation.    No facility-administered encounter medications on file as of 10/24/2020.   Review of patient status, including review of consultants reports, relevant laboratory and other test results, and collaboration with appropriate care team members and the patient's provider was performed as part of comprehensive patient evaluation and provision of care management services.   [name], [place] / [place]   [phone] 3:22 PM

## 2020-10-24 NOTE — Patient Instructions (Signed)
  [name] [name]  it was nice speaking with you. Please call me directly [phone] if you have questions about the goals we discussed. Goals Addressed            This Visit's Progress   . Manage My Stress        Patient Activities:  . continue with positive thinking and self-talk ( see it, write it, speak it, believe it) . continue with your goal of eating breakfast when you can . start self-care plan we discussed today ( things you enjoy ie wearing perfume)  . Review EMMI education: Movement /Emotional Health and Relieving Stress . Explore volunteer options     [name] [name] received Care Management services today:  1. Care Management services include personalized support from designated clinical staff supervised by her physician, including individualized plan of care and coordination with other care providers 2. 24/7 contact [phone] for assistance for urgent and routine care needs. 3. Care Management are voluntary services and be declined at any time by calling the office.  Patient verbalizes understanding of instructions provided today.  Follow up plan: SW will follow up with patient by phone over the next 3 weeks  Maurine Cane, LCSW

## 2020-10-24 NOTE — Addendum Note (Signed)
Addended by: [name] H on: 10/24/2020 03:30 PM   Modules accepted: Orders

## 2020-10-27 ENCOUNTER — Telehealth: Payer: Self-pay | Admitting: Family Medicine

## 2020-10-27 NOTE — Telephone Encounter (Signed)
[name] [name] 10/27/2020 Called pt regarding community resource referral received. My info is [phone] please see ref notes for more details. Thank you.  [place], Care Management

## 2020-10-28 ENCOUNTER — Ambulatory Visit: Payer: 59 | Admitting: Dermatology

## 2020-11-06 ENCOUNTER — Ambulatory Visit: Payer: 59 | Admitting: Family Medicine

## 2020-11-09 ENCOUNTER — Other Ambulatory Visit: Payer: Self-pay | Admitting: Family Medicine

## 2020-11-09 DIAGNOSIS — E118 Type 2 diabetes mellitus with unspecified complications: Secondary | ICD-10-CM

## 2020-11-09 DIAGNOSIS — K5909 Other constipation: Secondary | ICD-10-CM

## 2020-11-12 ENCOUNTER — Ambulatory Visit: Payer: 59 | Admitting: Licensed Clinical Social Worker

## 2020-11-12 DIAGNOSIS — R4589 Other symptoms and signs involving emotional state: Secondary | ICD-10-CM

## 2020-11-12 DIAGNOSIS — F439 Reaction to severe stress, unspecified: Secondary | ICD-10-CM

## 2020-11-13 NOTE — Patient Instructions (Signed)
  Ms. Vessel  it was nice speaking with you. Please call me directly [phone] if you have questions about the goals we discussed. Goals Addressed            This Visit's Progress   . Manage My Stress       Patient Goals/Self-Care Activities . implement interventions discussed today to decreases symptoms of stress and increase knowledge and/or ability of: coping skills. . Review EMMI education: Breathing to relax . F/U with support group for emotional wellness at [facility] . continue with positive thinking and self-talk ( see it, write it, speak it, believe it) . continue with your goal of eating breakfast when you can . start self-care plan we discussed today ( things you enjoy ie wearing perfume)      Ms. Saldierna received Care Management services today:  1. Care Management services include personalized support from designated clinical staff supervised by her physician, including individualized plan of care and coordination with other care providers 2. 24/7 contact [phone] for assistance for urgent and routine care needs. 3. Care Management are voluntary services and be declined at any time by calling the office. Patient verbalizes understanding of instructions provided today.    Follow up plan: SW will follow up with patient by phone over the next 2 weeks Maurine Cane, LCSW

## 2020-11-13 NOTE — Chronic Care Management (AMB) (Signed)
  Care Management   Follow Up Note   11/13/2020 Name: [name] [name] MRN: [id number] DOB: September 12, 1966  [name] [name] is enrolled in a Managed Medicaid plan: No. Outreach attempt today was successful.   Referred by: McDiarmid, Blane Ohara, MD Reason for referral : Care Coordination (counseling )  [name] [name] is a 54 y.o. year old female who is a primary care patient of McDiarmid, Blane Ohara, MD. The care management team was consulted for assistance with care management and care coordination needs.   Assessment: Patient continues to experiencing difficulty with managing stress however she is making progress.  Recent life changes: none reported Family, Social: connects with family as much as possible continue to experience from the death of her youngest son Concerns during this encounter/ how impacting: patient reports having to pay out of pocket for her lorazepam.  Is requesting the PCP change the Rx back to 9 days so that the insurance will cover the cost.  Insurance will not cover the current 8 days. Intervention: shared concern and request with PCP;  Recommendation: Patient may benefit from, and is in agreement to connect with support group to help manager her symptoms.   Review of patient status, including review of consultants reports, relevant laboratory and other test results, and collaboration with appropriate care team members and the patient's provider was performed as part of comprehensive patient evaluation and provision of chronic care management services.   Patient Care Plan: Social Work  Problem Identified: coping skills to manage stress   Priority: Medium  Onset Date: 09/11/2020  Goal: Coping Skills Enhanced   Start Date: 09/11/2020  Expected End Date: 11/25/2020  This Visit's Progress: On track  Recent Progress: On track  Priority: Medium  Note:   Current Barriers: Patient is experiencing symptoms of stress and anxiety which seem to be exacerbated by chronic medical  condition and family dynamics.     Clinical Goal(s): Over the next 60 days, patient will work with SW to reduce or manage symptoms of stress  Interventions:  . Assessed patient's needs, coping, progress, support and barriers to care . Provided basic mental health support, education and interventions (EMMI education: Breathing to Relax) . [place] with patient on the line to assist with connect to women's support group for emotional wellness ( e-mail also sent  . active listening utilized . counseling provided . current coping strategies identified . healthy lifestyle promoted . self-reflection promoted Patient Goals/Self-Care Activities . implement interventions discussed today to decreases symptoms of stress and increase knowledge and/or ability of: coping skills. . Review EMMI education: Movement /Emotional Health and Relieving Stress . F/U with support group for emotional wellness at [facility] . continue with positive thinking and self-talk ( see it, write it, speak it, believe it) . continue with your goal of eating breakfast when you can . start self-care plan we discussed today ( things you enjoy ie wearing perfume)  Follow Up Plan:  Marland Kitchen Appointment scheduled for SW follow up with client by phone on: Dec, [place], [place] / [place]   [phone] 8:36 AM

## 2020-11-17 ENCOUNTER — Other Ambulatory Visit: Payer: Self-pay

## 2020-11-17 DIAGNOSIS — R1115 Cyclical vomiting syndrome unrelated to migraine: Secondary | ICD-10-CM

## 2020-11-18 MED ORDER — OXYCODONE HCL 5 MG PO TABS: 5.0000 mg | ORAL_TABLET | Freq: Four times a day (QID) | ORAL | 0 refills | Status: DC | PRN

## 2020-11-26 ENCOUNTER — Ambulatory Visit: Payer: 59 | Admitting: Licensed Clinical Social Worker

## 2020-11-26 DIAGNOSIS — F439 Reaction to severe stress, unspecified: Secondary | ICD-10-CM

## 2020-11-26 DIAGNOSIS — Z719 Counseling, unspecified: Secondary | ICD-10-CM

## 2020-11-26 NOTE — Patient Instructions (Signed)
  [name] [name]  it was nice speaking with you. Please call me directly [phone] if you have questions about the goals we discussed. Goals Addressed            This Visit's Progress   . Manage My Stress   On track    Patient Goals/Self-Care Activities: over the next 21 days . implement interventions discussed today to decreases symptoms of stress and increase knowledge and/or ability of: coping skills. . Talk with son about getting e-mail fixed . F/U with women's resource center; explore [employer] for school options . continue with positive thinking and self-talk ( see it, write it, speak it, believe it) . continue with your goal of eating breakfast when you can . start self-care plan we discussed today ( things you enjoy ie wearing perfume)        [name] [name] received Care Management services today:  1. Care Management services include personalized support from designated clinical staff supervised by her physician, including individualized plan of care and coordination with other care providers 2. 24/7 contact [phone] for assistance for urgent and routine care needs. 3. Care Management are voluntary services and be declined at any time by calling the office. Patient verbalizes understanding of instructions provided today.    Follow up plan: SW will follow up with patient by phone over the next 3 weeks  Maurine Cane, LCSW

## 2020-11-26 NOTE — Chronic Care Management (AMB) (Signed)
Care Management   Follow Up Clinical Social Work General Note  11/26/2020 Name: [name] [name] MRN: [id number] DOB: 07-20-1966  [name] [name] is enrolled in a Managed Medicaid plan: No. Outreach attempt today was successful.   [name] [name] is a 54 y.o. year old female who is a primary care patient of McDiarmid, Blane Ohara, MD. The Care Management team was consulted to assist the patient with managing stressors.  Assessment: Patient is making progress with managing stress.  However she continues to express concerns with things/ bugs crawling on her.  This seems to be impacting her ability to go out and connect with things she enjoys doing.  States she does not want other the get these bugs.  Reports they get under her skin and she pick them off which leaves spots and sores on her body.  Plan: Patient would like continued follow-up. LCSW will f/u with patient in 3 weeks,  patient will call to schedule f/u appointments with eye doctor and dermatology   SDOH (Social Determinants of Health) screening performed today: None identified.   Advanced Directives Status: N See Care Plan and Vyn[employer] application for related entries.;Not addressed in this encounter.   Patient Care Plan: Social Work  Problem Identified: coping skills to manage stress   Priority: Medium  Onset Date: 09/11/2020  Goal: Coping Skills Enhanced   Start Date: 09/11/2020  Expected End Date: 01/26/2021  Recent Progress: On track  Priority: Medium  Note:   Current Barriers: Patient is experiencing symptoms of stress and anxiety which seem to be exacerbated by chronic medical condition and family dynamics.     Clinical Goal(s): Over the next 60 days, patient will work with SW to reduce or manage symptoms of stress  Interventions:  . Assessed patient's needs, coping skills, progress, support and barriers to care . Discussed support options in the community ( church, [employer] center, and ongoing counseling)  active listening  utilized  current coping strategies identified  healthy lifestyle promoted  self-reflection promoted  Other interventions include: Motivational Interviewing Solution-Focused Strategies ;Emotional/Supportive Counseling Problem Solving ;  Patient Goals/Self-Care Activities: over the next 21 days . implement interventions discussed today to decreases symptoms of stress and increase knowledge and/or ability of: coping skills. . Talk with son about getting e-mail fixed . [facility] with [facility]; explore [employer] for school options . continue with positive thinking and self-talk ( see it, write it, speak it, believe it) . continue with your goal of eating breakfast when you can . start self-care plan we discussed today ( things you enjoy ie wearing perfume)  Follow Up Plan:  Marland Kitchen Appointment scheduled for SW follow up with client by phone on: Dec, 15th    Outpatient Encounter Medications as of 11/26/2020  Medication Sig  . B-D UF III MINI PEN NEEDLES 31G X 5 MM MISC CHECK BLOOD GLUCOSE THREE TIMES DAILY AS DIRECTED  . lubiprostone (AMITIZA) 24 MCG capsule TAKE 1 CAPSULE(24 MCG) BY MOUTH TWICE DAILY WITH A MEAL  . ACCU-CHEK AVIVA PLUS test strip USE AS DIRECTED  . Accu-Chek Softclix Lancets lancets USE AS DIRECTED UP TO FOUR TIMES DAILY.  . blood glucose meter kit and supplies KIT Dispense based on patient and insurance preference. Use up to four times daily as directed. (FOR ICD-9 250.00, 250.01).  . carvedilol (COREG) 6.25 MG tablet TAKE 1 TABLET BY MOUTH TWICE DAILY WITH MEALS  . Diclofenac Sodium-Menthol 1.5 & 10 % THPK Apply 4 g topically 4 (four)  times daily.  . empagliflozin (JARDIANCE) 25 MG TABS tablet Take 25 mg by mouth daily.  . insulin lispro (HUMALOG) 100 UNIT/ML KwikPen Inject 2 units subcutaneous only after eating at least half of your dinner each day.  Marland Kitchen LANTUS SOLOSTAR 100 UNIT/ML Solostar Pen ADMINISTER 20 UNITS UNDER THE SKIN DAILY  . mupirocin ointment  (BACTROBAN) 2 % Apply 1 application topically daily.  Marland Kitchen oxyCODONE (OXY IR/ROXICODONE) 5 MG immediate release tablet Take 1 tablet (5 mg total) by mouth every 6 (six) hours as needed for moderate pain or severe pain.  . promethazine (PHENERGAN) 12.5 MG tablet TAKE 1 TABLET(12.5 MG) BY MOUTH EVERY 6 HOURS AS NEEDED FOR NAUSEA OR VOMITING  . promethazine (PHENERGAN) 25 MG suppository Place 1 suppository (25 mg total) rectally every 6 (six) hours as needed for nausea or vomiting.  Marland Kitchen QUEtiapine (SEROQUEL) 50 MG tablet Take 1 tablet (50 mg total) by mouth at bedtime.  . senna (SENOKOT) 8.6 MG TABS tablet Take 2 tablets (17.2 mg total) by mouth daily as needed for moderate constipation.   No facility-administered encounter medications on file as of 11/26/2020.    Ms. Edison was given information about Care Management services today including:  1. Care Management services include personalized support from designated clinical staff supervised by her physician, including individualized plan of care and coordination with other care providers 2. 24/7 contact phone numbers for assistance for urgent and routine care needs. 3. The patient may stop care management services at any time (effective at the end of the month) by phone call to the office staff.  Patient agreed to services and verbal consent obtained.   Maurine Cane, LCSW

## 2020-12-08 DIAGNOSIS — Z961 Presence of intraocular lens: Secondary | ICD-10-CM

## 2020-12-08 HISTORY — DX: Presence of intraocular lens: Z96.1

## 2020-12-15 ENCOUNTER — Other Ambulatory Visit: Payer: Self-pay

## 2020-12-15 DIAGNOSIS — R1115 Cyclical vomiting syndrome unrelated to migraine: Secondary | ICD-10-CM

## 2020-12-16 ENCOUNTER — Ambulatory Visit (INDEPENDENT_AMBULATORY_CARE_PROVIDER_SITE_OTHER): Payer: 59 | Admitting: Family Medicine

## 2020-12-16 ENCOUNTER — Other Ambulatory Visit: Payer: Self-pay

## 2020-12-16 ENCOUNTER — Observation Stay (HOSPITAL_COMMUNITY)
Admission: EM | Admit: 2020-12-16 | Discharge: 2020-12-18 | Disposition: A | Discharge status: Home / Self Care | Payer: 59 | Attending: Family Medicine | Admitting: Family Medicine

## 2020-12-16 VITALS — BP 140/84 | HR 82 | Wt 148.6 lb

## 2020-12-16 DIAGNOSIS — E1122 Type 2 diabetes mellitus with diabetic chronic kidney disease: Secondary | ICD-10-CM | POA: Diagnosis not present

## 2020-12-16 DIAGNOSIS — Z79899 Other long term (current) drug therapy: Secondary | ICD-10-CM | POA: Diagnosis not present

## 2020-12-16 DIAGNOSIS — Z794 Long term (current) use of insulin: Secondary | ICD-10-CM | POA: Insufficient documentation

## 2020-12-16 DIAGNOSIS — N289 Disorder of kidney and ureter, unspecified: Secondary | ICD-10-CM

## 2020-12-16 DIAGNOSIS — R1110 Vomiting, unspecified: Secondary | ICD-10-CM | POA: Diagnosis present

## 2020-12-16 DIAGNOSIS — F17210 Nicotine dependence, cigarettes, uncomplicated: Secondary | ICD-10-CM | POA: Insufficient documentation

## 2020-12-16 DIAGNOSIS — Z20822 Contact with and (suspected) exposure to covid-19: Secondary | ICD-10-CM | POA: Diagnosis not present

## 2020-12-16 DIAGNOSIS — N183 Chronic kidney disease, stage 3 unspecified: Secondary | ICD-10-CM | POA: Diagnosis not present

## 2020-12-16 DIAGNOSIS — I129 Hypertensive chronic kidney disease with stage 1 through stage 4 chronic kidney disease, or unspecified chronic kidney disease: Secondary | ICD-10-CM | POA: Insufficient documentation

## 2020-12-16 DIAGNOSIS — R1115 Cyclical vomiting syndrome unrelated to migraine: Secondary | ICD-10-CM

## 2020-12-16 DIAGNOSIS — R112 Nausea with vomiting, unspecified: Principal | ICD-10-CM | POA: Insufficient documentation

## 2020-12-16 DIAGNOSIS — R1013 Epigastric pain: Secondary | ICD-10-CM

## 2020-12-16 DIAGNOSIS — S4991XA Unspecified injury of right shoulder and upper arm, initial encounter: Secondary | ICD-10-CM

## 2020-12-16 DIAGNOSIS — Z86010 Personal history of colonic polyps: Secondary | ICD-10-CM | POA: Diagnosis not present

## 2020-12-16 LAB — URINALYSIS, ROUTINE W REFLEX MICROSCOPIC
Bilirubin Urine: NEGATIVE
Glucose, UA: 50 mg/dL — AB
Ketones, ur: NEGATIVE mg/dL
Leukocytes,Ua: NEGATIVE
Nitrite: NEGATIVE
Protein, ur: 100 mg/dL — AB
Specific Gravity, Urine: 1.01 (ref 1.005–1.030)
pH: 7 (ref 5.0–8.0)

## 2020-12-16 LAB — CBC
HCT: 36.8 % (ref 36.0–46.0)
Hemoglobin: 12 g/dL (ref 12.0–15.0)
MCH: 30.6 pg (ref 26.0–34.0)
MCHC: 32.6 g/dL (ref 30.0–36.0)
MCV: 93.9 fL (ref 80.0–100.0)
Platelets: 207 10*3/uL (ref 150–400)
RBC: 3.92 MIL/uL (ref 3.87–5.11)
RDW: 14.6 % (ref 11.5–15.5)
WBC: 6.5 10*3/uL (ref 4.0–10.5)
nRBC: 0 % (ref 0.0–0.2)

## 2020-12-16 LAB — COMPREHENSIVE METABOLIC PANEL
ALT: 12 U/L (ref 0–44)
AST: 17 U/L (ref 15–41)
Albumin: 4.2 g/dL (ref 3.5–5.0)
Alkaline Phosphatase: 87 U/L (ref 38–126)
Anion gap: 10 (ref 5–15)
BUN: 26 mg/dL — ABNORMAL HIGH (ref 6–20)
CO2: 24 mmol/L (ref 22–32)
Calcium: 10 mg/dL (ref 8.9–10.3)
Chloride: 109 mmol/L (ref 98–111)
Creatinine, Ser: 2.29 mg/dL — ABNORMAL HIGH (ref 0.44–1.00)
GFR, Estimated: 25 mL/min — ABNORMAL LOW (ref >60)
Glucose, Bld: 66 mg/dL — ABNORMAL LOW (ref 70–99)
Potassium: 4.5 mmol/L (ref 3.5–5.1)
Sodium: 143 mmol/L (ref 135–145)
Total Bilirubin: 0.8 mg/dL (ref 0.3–1.2)
Total Protein: 7.3 g/dL (ref 6.5–8.1)

## 2020-12-16 LAB — LIPASE, BLOOD: Lipase: 33 U/L (ref 11–51)

## 2020-12-16 LAB — I-STAT BETA HCG BLOOD, ED (MC, WL, AP ONLY): I-stat hCG, quantitative: 10.4 m[IU]/mL — ABNORMAL HIGH (ref <5)

## 2020-12-16 MED ORDER — OXYCODONE HCL 5 MG PO TABS: 5.0000 mg | ORAL_TABLET | Freq: Four times a day (QID) | ORAL | 0 refills | Status: DC | PRN

## 2020-12-16 MED ORDER — LORAZEPAM 1 MG PO TABS: 1.0000 mg | ORAL_TABLET | Freq: Four times a day (QID) | ORAL | 0 refills | Status: DC | PRN

## 2020-12-16 MED ORDER — PROMETHAZINE HCL 25 MG/ML IJ SOLN
25.0000 mg | Freq: Once | INTRAMUSCULAR | Status: AC
  Administered 2020-12-16: 15:00:00 25 mg via INTRAMUSCULAR

## 2020-12-16 NOTE — Assessment & Plan Note (Addendum)
Acute on chronic in setting of long history of cyclic vomiting syndrome currently in acute flare. Hemodynamically stable and making tears on exam. No other infectious symptoms. Diffusely tender to abdomen with inability to tolerate PO to take her abortive medications which includes Ativan 1mg , Oxycodone 5mg , and Phenergan 12.5mg . She has noted prior history of significant improvement with ativan when it comes to aborting these flares. Discussed management options however patient has opted to go to ED for further evaluation and treatment. Phenergan 25mg  IM given now. [facility] contacted to transport patient. Patient may benefit from x-ray to rule out obstruction given her sudden onset significant abdominal pain and vomiting.

## 2020-12-16 NOTE — Progress Notes (Signed)
   Subjective:   Patient ID: [name] [name]    DOB: 1966/02/11, 54 y.o. female   MRN: [id number]  [name] [name] is a 54 y.o. female with a history of HTN, LV diastolic dysfunction, allergic rhinitis, cyclic vomiting syndrome, diabetic gastroparesis, GERD with esophagitis, tubular adenoma of colon, diabetic neuropathy, T2DM, secondary hyperparathyroidism, CKD 4, anxiety, chronic bilateral low back pain, chronic insomnia, continuous cannabis use, intermittent constipation, mood disorder, normocytic anemia, hypercholesterolemia, tobacco abuse, Vit D deficiency here for cyclic vomiting flare.  Cyclic Vomiting Flare Patient present today noting that she woke up feeling okay this morning but then all of a sudden started feeling hot, started to cough, and then began vomiting. She hasnt been able to hold anything down since. She has long history of cyclic vomiting treated with abortive therapy regimen that includes Lorazepam 1mg , Oxycodone, and Phenergan. She is also instructed to take Omeprazole 40mg  QD and increase to two capsules daily when vomiting per GI. She notes that she tried to take her ativan, omeprazole, and BP med this morning but vomited up. She took a phenergan suppository but no improvement. She endorses significant abdominal pain, headache, and back ache because she hasnt been able to take her pain meds today. She denies any other infectious symptoms such as diarrhea, constipation, fevers, chills, SOB, blood in her vomit.  Review of Systems:  Per HPI.   Objective:   BP 140/84   Pulse 82   Wt 148 lb 9.6 oz (67.4 kg)   SpO2 99%   BMI 27.18 kg/m  Vitals and nursing note reviewed.  General: older woman, appears very uncomfortable curled up on exam bed, tearful throughout exam, non-toxic appearance CV: regular rate and rhythm without murmurs, rubs, or gallops Lungs: clear to auscultation bilaterally with normal work of breathing on room air, speaking in full sentences Abdomen: guarding  on exam, very tender to palpation Skin: warm, dry Neuro: Alert and oriented, speech normal  Assessment & Plan:   Intractable vomiting Acute on chronic in setting of long history of cyclic vomiting syndrome currently in acute flare. Hemodynamically stable and making tears on exam. No other infectious symptoms. Diffusely tender to abdomen with inability to tolerate PO to take her abortive medications which includes Ativan 1mg , Oxycodone 5mg , and Phenergan 12.5mg . She has noted prior history of significant improvement with ativan when it comes to aborting these flares. Discussed management options however patient has opted to go to ED for further evaluation and treatment. Phenergan 25mg  IM given now. [facility] contacted to transport patient. Patient may benefit from x-ray to rule out obstruction given her sudden onset significant abdominal pain and vomiting.  No orders of the defined types were placed in this encounter.  Meds ordered this encounter  Medications  . promethazine (PHENERGAN) injection 25 mg    Mina Marble, DO PGY-3, [facility] 12/16/2020 3:18 PM

## 2020-12-16 NOTE — ED Triage Notes (Signed)
Reports abdominal and epigastric pain since waking up this morning with associated n/v. Went to family practice, was given Phenergan with some relief. 100 mcg Fentanyl given by [facility].

## 2020-12-17 DIAGNOSIS — N289 Disorder of kidney and ureter, unspecified: Secondary | ICD-10-CM | POA: Diagnosis not present

## 2020-12-17 DIAGNOSIS — R112 Nausea with vomiting, unspecified: Secondary | ICD-10-CM | POA: Diagnosis not present

## 2020-12-17 LAB — CBG MONITORING, ED
Glucose-Capillary: 271 mg/dL — ABNORMAL HIGH (ref 70–99)
Glucose-Capillary: 259 mg/dL — ABNORMAL HIGH (ref 70–99)

## 2020-12-17 LAB — RESP PANEL BY RT-PCR (FLU A&B, COVID) ARPGX2
Influenza A by PCR: NEGATIVE
Influenza B by PCR: NEGATIVE
SARS Coronavirus 2 by RT PCR: NEGATIVE

## 2020-12-17 LAB — HIV ANTIBODY (ROUTINE TESTING W REFLEX): HIV Screen 4th Generation wRfx: NONREACTIVE

## 2020-12-17 LAB — GLUCOSE, CAPILLARY
Glucose-Capillary: 54 mg/dL — ABNORMAL LOW (ref 70–99)
Glucose-Capillary: 110 mg/dL — ABNORMAL HIGH (ref 70–99)

## 2020-12-17 MED ORDER — INSULIN REGULAR HUMAN 100 UNIT/ML IJ SOLN
0.0000 [IU] | Freq: Three times a day (TID) | INTRAMUSCULAR | Status: DC
  Filled 2020-12-17: qty 3

## 2020-12-17 MED ORDER — HYDROMORPHONE HCL 1 MG/ML IJ SOLN
1.0000 mg | Freq: Once | INTRAMUSCULAR | Status: AC
  Administered 2020-12-17: 07:00:00 1 mg via INTRAVENOUS
  Filled 2020-12-17: qty 1

## 2020-12-17 MED ORDER — HALOPERIDOL LACTATE 5 MG/ML IJ SOLN
2.0000 mg | Freq: Once | INTRAMUSCULAR | Status: AC
  Administered 2020-12-17: 03:00:00 2 mg via INTRAVENOUS
  Filled 2020-12-17: qty 1

## 2020-12-17 MED ORDER — CAPSAICIN 0.025 % EX CREA
TOPICAL_CREAM | Freq: Two times a day (BID) | CUTANEOUS | Status: DC
  Filled 2020-12-17: qty 60

## 2020-12-17 MED ORDER — INSULIN ASPART 100 UNIT/ML ~~LOC~~ SOLN: 0.0000 [IU] | Freq: Three times a day (TID) | SUBCUTANEOUS | Status: DC

## 2020-12-17 MED ORDER — ENOXAPARIN SODIUM 30 MG/0.3ML ~~LOC~~ SOLN
30.0000 mg | SUBCUTANEOUS | Status: DC
  Administered 2020-12-17 – 2020-12-18 (×2): 30 mg via SUBCUTANEOUS
  Filled 2020-12-17 (×2): qty 0.3

## 2020-12-17 MED ORDER — MORPHINE SULFATE (PF) 4 MG/ML IV SOLN
4.0000 mg | INTRAVENOUS | Status: DC | PRN
  Administered 2020-12-17 – 2020-12-18 (×3): 4 mg via INTRAVENOUS
  Filled 2020-12-17 (×3): qty 1

## 2020-12-17 MED ORDER — CARVEDILOL 6.25 MG PO TABS
6.2500 mg | ORAL_TABLET | Freq: Two times a day (BID) | ORAL | Status: DC
  Administered 2020-12-17 – 2020-12-18 (×3): 6.25 mg via ORAL
  Filled 2020-12-17: qty 1
  Filled 2020-12-17: qty 2
  Filled 2020-12-17: qty 1

## 2020-12-17 MED ORDER — SENNA 8.6 MG PO TABS: 2.0000 | ORAL_TABLET | Freq: Every day | ORAL | Status: DC | PRN

## 2020-12-17 MED ORDER — PROMETHAZINE HCL 25 MG/ML IJ SOLN
25.0000 mg | Freq: Four times a day (QID) | INTRAMUSCULAR | Status: DC | PRN
  Administered 2020-12-17: 21:00:00 25 mg via INTRAVENOUS
  Filled 2020-12-17: qty 1

## 2020-12-17 MED ORDER — DEXTROSE-NACL 5-0.9 % IV SOLN: INTRAVENOUS | Status: DC

## 2020-12-17 MED ORDER — QUETIAPINE FUMARATE 25 MG PO TABS
50.0000 mg | ORAL_TABLET | Freq: Every day | ORAL | Status: DC
  Administered 2020-12-17: 22:00:00 50 mg via ORAL
  Filled 2020-12-17: qty 2
  Filled 2020-12-17: qty 1

## 2020-12-17 MED ORDER — LORAZEPAM 2 MG/ML IJ SOLN
1.0000 mg | Freq: Once | INTRAMUSCULAR | Status: AC
  Administered 2020-12-17: 04:00:00 1 mg via INTRAVENOUS
  Filled 2020-12-17: qty 1

## 2020-12-17 MED ORDER — SODIUM CHLORIDE 0.9 % IV BOLUS (SEPSIS)
1000.0000 mL | Freq: Once | INTRAVENOUS | Status: AC
  Administered 2020-12-17: 03:00:00 1000 mL via INTRAVENOUS

## 2020-12-17 MED ORDER — INSULIN ASPART 100 UNIT/ML ~~LOC~~ SOLN
0.0000 [IU] | Freq: Three times a day (TID) | SUBCUTANEOUS | Status: DC
  Administered 2020-12-17: 12:00:00 5 [IU] via SUBCUTANEOUS
  Administered 2020-12-18: 13:00:00 2 [IU] via SUBCUTANEOUS

## 2020-12-17 NOTE — ED Notes (Signed)
Pt given clear liquids

## 2020-12-17 NOTE — Progress Notes (Signed)
Dr Andria Frames has been paged- the patient is requesting pain medication

## 2020-12-17 NOTE — Progress Notes (Signed)
Inpatient Diabetes Program Recommendations  AACE/ADA: New Consensus Statement on Inpatient Glycemic Control (2015)  Target Ranges:  Prepandial:   less than 140 mg/dL      Peak postprandial:   less than 180 mg/dL (1-2 hours)      Critically ill patients:  140 - 180 mg/dL   Lab Results  Component Value Date   GLUCAP 271 (H) 12/17/2020   HGBA1C 8.3 (A) 08/28/2020    Review of Glycemic Control Results for [name] [name], [name] [name] (MRN [id number]) as of 12/17/2020 10:24  Ref. Range 12/17/2020 04:12  Glucose-Capillary Latest Ref Range: 70 - 99 mg/dL 271 (H)   Diabetes history:  DM2 Outpatient Diabetes medications:  Lantus 20 units QAM (last dose 12/21 at 1000) Humalog 2 units with supper Jardiance 25 mg daily (not taking x 1 month) Current orders for Inpatient glycemic control:  Novolin R 0-9 units TID  Inpatient Diabetes Program Recommendations:     Lantus 10 units QD (can start now) Novolog 0-9 units Q4H while NPO and then TID once eating along with 0-5 QHS.  Note:  Spoke with patient at bedside.  Verified above medications.  She stopped Jardiance 1 month ago because she states "it makes me pee my pants".  She does not check her blood sugar at home because she needs a battery and more strips.  Please send home with prescription for more strips at time of discharge.  Encouraged her to check her blood sugar at least once a day.  She denies difficulties obtaining insulins.    Will continue to follow while inpatient.  Thank you, Reche Dixon, RN, BSN Diabetes Coordinator Inpatient Diabetes Program [phone] (team pager from 8a-5p)

## 2020-12-17 NOTE — ED Notes (Signed)
MD at bedside. 

## 2020-12-17 NOTE — Progress Notes (Signed)
Physical Therapy is at the bedside for session.

## 2020-12-17 NOTE — ED Notes (Signed)
Pt asking about ice water. Informed Melissa - RN.

## 2020-12-17 NOTE — ED Notes (Signed)
Pt's CBG result was 259. Informed Melissa - RN.

## 2020-12-17 NOTE — Progress Notes (Signed)
Glucometer check- 53 denies symptoms of low blood glucose.  Frozen fruit pop given to the patient.

## 2020-12-17 NOTE — ED Provider Notes (Signed)
[facility] EMERGENCY DEPARTMENT Provider Note   CSN: [id number] Arrival date & time: 12/16/20  1629     History Chief Complaint  Patient presents with  . Abdominal Pain    [name] [name] is a 54 y.o. female.  The history is provided by the patient.  Abdominal Pain Pain location:  Epigastric Pain quality: sharp   Pain radiates to:  Does not radiate Pain severity:  Severe Onset quality:  Gradual Duration:  1 day Timing:  Constant Progression:  Worsening Chronicity:  New Relieved by:  Nothing Worsened by:  Movement and palpation Associated symptoms: nausea and vomiting   Associated symptoms: no chest pain, no diarrhea, no fever, no hematemesis, no hematochezia and no hematuria   Patient with a history of gastroparesis, CKD, diabetes presents with intractable vomiting. Patient reports she has had similar episodes over the past several years. She has upper abdominal pain associated with multiple episodes of vomiting. No chest pain or shortness of breath. No diarrhea. She was sent over from the family medicine clinic.     Past Medical History:  Diagnosis Date  . Abdominal pain, generalized   . Acquired bilateral hammer toes 01/20/2017  . Acute post-traumatic stress disorder 11/17/2018  . AKI (acute kidney injury) ([place])   . Allergy   . Anemia   . Anxiety   . Arthritis   . At risk for polypharmacy 08/15/2014  . Biceps tendinopathy of right upper extremity 04/05/2014  . Blood transfusion 2003  . Cataract    both eyes  . Chronic generalized abdominal pain   . Chronic kidney disease (CKD), stage III (moderate) (HCC)   . Chronic leg cramping 07/18/2014  . CKD (chronic kidney disease) 08/17/2013   Seen by Dr Joelyn Oms 07/28/15 ([place]) - (see scanned document): 20 years diabetes, 15 years HTN, stable CKD stage III with proteinuria.  - CKD, discussed NSAID avoidance, diabetes control with target A1c<7.5%, and HTN control with SBP<140. F/u 6 months  and repeat renal panel at that time. - Microalbuminuria: On lisinopril 28m, suspected secondary due to diabetic nephropathy. - Chronic back pain: No NSDAIDs. - Say amlodipine causes nausea  - Advised not to ACE-i during vomiting episodes    . Complication of anesthesia    "problems waking up"  . Cyclic vomiting syndrome   . Delayed gastric emptying 05/12/2011    04/12/11 NUCLEAR MEDICINE GASTRIC EMPTYING SCAN Radiopharmaceutical: 2.0 mCi [id number] colloid. Comparison: None. Findings: At 1 hour, 81% of the counts remain in the stomach. At 2 hours, 53% remain. Normal is less than 30% at 2 hours. IMPRESSION: Moderate delay in gastric emptying.      . Depression   . Depression with anxiety 02/23/2007   See [facility] Note dated 05/06/2016.  IC (KGwenlyn Saranwas called in and I did a brief assessment.   . Diabetic gastroparesis ([facility]   /e-chart  . Diabetic ketoacidosis without coma associated with type 2 diabetes mellitus ([facility]  . DKA (diabetic ketoacidoses) 08/03/2016  . Elevated cholesterol 11/28/2015  . Emotional stress 12/30/2015   Stress after Breakup with partnes of 13 years on 12/27/2015   . Encounter for chronic pain management 01/06/2015   Indication for chronic opioid: Chronic back pain, cyclic vomiting Medication and dose: oxycodone 69m70m# pills per month: 15 Last UDS date: 05/08/2015 - result pending Pain contract signed (Y/N): Yes, reviewed 01/06/2015  Date narcotic database last reviewed (include red flags): 05/08/2015 (no red flags).    .Marland Kitchen  Erosive esophagitis 08/06/2019  . Esophagitis 04/18/2013   Documented by Dr. Henrene Pastor EGD 03/2011  He also consulted on her during hospitalization 22/2979 for cyclical vomiting. No repeat EGD necessary, treat esophagitis with PPI.    Marland Kitchen Esophagitis determined by endoscopy 04/18/2013   Documented by Dr. Henrene Pastor EGD 03/2011  He also consulted on her during hospitalization 89/2119 for cyclical vomiting. No repeat EGD necessary, treat esophagitis with PPI.    Marland Kitchen  Essential hypertension, malignant 02/23/2007   Jan 2015 - stopped recently-started norvasc due to reports of GI symptoms. 02/2015 - holding lisinopril, adding hydralazine 26m TID, and will take 1/2 tab lisinopril when feeling better when not in cyclic vomiting flare   . GERD (gastroesophageal reflux disease)   . Grief reaction 10/16/2018  . Heart murmur   . Hematuria 07/09/2015  . High risk social situation 08/31/2012  . Hip pain, bilateral 03/31/2018  . History of colonic polyps 11/15/2018   10/2017 screening colonoscopy Dr JScarlette Shorts Seven polyps were found in the sigmoid colon, transverse colon, ascending colon and cecum. The polyps were 2 to 10 mm in size. These polyps were removed with a cold snare.   .Marland KitchenHistory of multiple colonic tubular adenomas 11/15/2018   10/2019 Surveillance colonoscopy Dr JScarlette Shortsfound five (1-3 mm) polyps that were removed by cold snares (polypectomies). Polyps in ascending colon and cecum. Histopathology:TUBULAR ADENOMA, NEGATIVE FOR HIGH GRADE DYSPLASIA (4).  Recommendation repeat surv. c-scopy in 3 yrs.  10/2017 screening colonoscopy Dr JScarlette Shorts Seven tubular adenomas were found in the sigmoid colon, transverse colon, a  . Hot flashes 07/29/2011  . Hypertension   . Incontinence 10/03/2017  . INSOMNIA NOS 02/23/2007   Qualifier: Diagnosis of  By: HBeryle Lathe   . Intermittent constipation 06/06/2015  . Intestinal impaction ([facility]   . Intractable vomiting 07/02/2016  . Marijuana smoker, continuous    pt. denies does smoke marijuana  . MIGRAINE, UNSPEC., W/O INTRACTABLE MIGRAINE 02/23/2007   Qualifier: Diagnosis of  By: HBeryle Lathe   . Myocardial infarction ([place] 07/2007   "they say I've had a silent one; I don't know"  . Nausea and vomiting in adult 06/06/2013  . Neuromuscular disorder (HCC)    neuropathy in both legs  . NEUROPATHY, DIABETIC 02/23/2007  . Nonspecific low back pain 04/10/2007   Back pain is chronic. Oxycodone per prior notes helps pt be  more active. Reportedly uses 4 tab/week oxy for intermittent worsening of chronic back pain but mostly for cyclic vom to keep her out of hospital.    . Normocytic anemia   . Onychomycosis 03/25/2015  . PANCREATITIS 05/09/2008  . Pneumonia   . Pre-ulcerative calluses 05/27/2017  . Primary income source is SS Disability Income 03/10/2017   Disability SSI  . Protein-calorie malnutrition, severe ([facility] 12/17/2013  . Pure hypercholesterolemia 08/15/2014   Based on her choleserol, BP, smoker status, and that she is a diabetic, her 10 year ASCVD risk is 9.9% putting her in a category of risk that would benefit from high-intensity statin.    .Marland KitchenPyelonephritis 06/09/2015   stranding about kidney on CT [facility]   . Recurrent major depressive disorder ([facility] the [place]   . Rotator cuff syndrome 04/19/2014   rt. shoulder  . Rotator cuff tendonopathies with partial tear, right 03/31/2018   05/23/18 MRI: 1. Severe tendonosis of the supraspinatus tendon with a partial-thickness bursal surface tear.     2. Mild tendinosis of the infraspinatus tendon.     3. Mild tendinosis of  the subscapularis tendon  . Smoker   . Stye external 09/02/2017  . TOBACCO DEPENDENCE 02/23/2007   2 cigarettes every few days 11/2012 3 cigarettes daily    . Type II diabetes mellitus ([place])   . Uncontrolled secondary diabetes mellitus with stage 3 CKD (GFR 30-59) (HCC) 10/23/2011   Nephropathy, gastroparesis  HgbA1c (06/2011) 7.6, 9.5, 12.2, 8.2, 10.9 (07/2012), 9.2 (02/11/2013), 7.7 (09/27/13)  Metformin: she has a history of cyclical vomiting; she would prefer not to take this medication  On lanuts 20U qam with improved control. 09/27/13   . Vitamin D deficiency 11/28/2014    Patient Active Problem List   Diagnosis Date Noted  . Mood disorder ([place])   . Skin disorder 05/23/2020  . Tubular adenoma of colon 12/03/2019  . Continuous cannabis use 11/08/2019  . Left ventricular diastolic dysfunction with preserved systolic function 32/44/0102  . Intractable  vomiting 08/12/2018  . Primary income source is SS Disability Income 03/10/2017  . Port-A-Cath in place 01/20/2017  . Secondary hyperparathyroidism of renal origin ([place]) 10/05/2016  . Essential hypertension 05/01/2016  . Diabetic gastroparesis associated with type 2 diabetes mellitus ([place]) 05/01/2016  . DM (diabetes mellitus), type 2 with complications ([place])   . Normocytic anemia   . Intermittent constipation 06/06/2015  . Encounter for monitoring opioid maintenance therapy 01/06/2015  . Vitamin D deficiency 11/28/2014  . Pure hypercholesterolemia 08/15/2014  . Chronic kidney disease (CKD) stage G4/A2, severely decreased glomerular filtration rate (GFR) between 15-29 mL/min/1.73 square meter and albuminuria creatinine ratio between 30-299 mg/g (HCC) 08/17/2013  . Allergic rhinitis, seasonal 05/04/2012  . Cyclic vomiting syndrome 08/03/2011  . Neck muscle spasm 01/01/2011  . Chronic bilateral low back pain 04/10/2007  . Anxiety disorder 02/23/2007  . Tobacco abuse 02/23/2007  . Chronic insomnia secondary to General Medical and Mood disorders 02/23/2007  . Diabetic autonomic neuropathy ([name]) 02/23/2007  . GERD with esophagitis 01/27/2004    Past Surgical History:  Procedure Laterality Date  . ABDOMINAL HYSTERECTOMY  02/2002  . CARDIAC CATHETERIZATION    . CATARACT EXTRACTION W/ INTRAOCULAR LENS  IMPLANT, BILATERAL    . CESAREAN SECTION     x 3  . CHOLECYSTECTOMY  1980's  . COLONOSCOPY     patient not sure  . DILATION AND EVACUATION  X3  . laparotomy and lysis of adhesions    . left hand surgery    . PORT-A-CATH placement  ~ 2008   right chest; "poor access; frequent hospitalizations"  . SHOULDER ARTHROSCOPY Right 07/21/2019   [facility] (Orth[facility]): Arthroscopic rotator cuff repair; Arthroscopic distal clavicle resection; Arthroscopic subacromial decompression and bursectomy;  Debridement of degenerative labral tear  . SMALL INTESTINE SURGERY     laporotomy with lysis of  adhesions.  Marland Kitchen UPPER GI ENDOSCOPY       OB History    Gravida  7   Para      Term      Preterm      AB  3   Living  4     SAB      IAB  3   Ectopic      Multiple      Live Births              Family History  Problem Relation Age of Onset  . Diabetes type II Mother   . Hypertension Mother   . Heart attack Mother   . Other Mother        Leg amputation  . Kidney  disease Mother        Required dialysis 10 years  . Diabetes type II Sister   . Diabetes Sister   . Hypertension Sister   . Alcohol abuse Father   . Diabetes Sister   . Diabetes Sister   . Cancer Sister 1       Brain  . Colon cancer Neg Hx   . Colon polyps Neg Hx   . Esophageal cancer Neg Hx   . Rectal cancer Neg Hx   . Stomach cancer Neg Hx     Social History   Tobacco Use  . Smoking status: Current Some Day Smoker    Packs/day: 0.50    Years: 22.00    Pack years: 11.00    Types: Cigarettes    Start date: 12/27/1982  . Smokeless tobacco: Never Used  Vaping Use  . Vaping Use: Never used  Substance Use Topics  . Alcohol use: Yes    Comment: occaisionally; pt reports drinking beer to help her when constipated  . Drug use: Yes    Types: Marijuana    Comment: last use 2 joints on 11/19/19    Home Medications Prior to Admission medications   Medication Sig Start Date End Date Taking? Authorizing Provider  B-D UF III MINI PEN NEEDLES 31G X 5 MM MISC CHECK BLOOD GLUCOSE THREE TIMES DAILY AS DIRECTED 11/10/20   McDiarmid, Blane Ohara, MD  lubiprostone (AMITIZA) 24 MCG capsule TAKE 1 CAPSULE(24 MCG) BY MOUTH TWICE DAILY WITH A MEAL 11/10/20   McDiarmid, Blane Ohara, MD  ACCU-CHEK AVIVA PLUS test strip USE AS DIRECTED 07/22/20   McDiarmid, Blane Ohara, MD  Accu-Chek Softclix Lancets lancets USE AS DIRECTED UP TO FOUR TIMES DAILY. 08/29/20   McDiarmid, Blane Ohara, MD  blood glucose meter kit and supplies KIT Dispense based on patient and insurance preference. Use up to four times daily as directed. (FOR ICD-9  250.00, 250.01). 08/28/20   McDiarmid, Blane Ohara, MD  carvedilol (COREG) 6.25 MG tablet TAKE 1 TABLET BY MOUTH TWICE DAILY WITH MEALS 04/14/20   McDiarmid, Blane Ohara, MD  Diclofenac Sodium-Menthol 1.5 & 10 % THPK Apply 4 g topically 4 (four) times daily. 11/12/19   McDiarmid, Blane Ohara, MD  empagliflozin (JARDIANCE) 25 MG TABS tablet Take 25 mg by mouth daily. 12/10/19   McDiarmid, Blane Ohara, MD  insulin lispro (HUMALOG) 100 UNIT/ML KwikPen Inject 2 units subcutaneous only after eating at least half of your dinner each day. 07/10/20   McDiarmid, Blane Ohara, MD  LANTUS SOLOSTAR 100 UNIT/ML Solostar Pen ADMINISTER 20 UNITS UNDER THE SKIN DAILY 04/07/20   McDiarmid, Blane Ohara, MD  LORazepam (ATIVAN) 1 MG tablet Take 1 tablet (1 mg total) by mouth every 6 (six) hours as needed for anxiety. 12/16/20   Lenoria Chime, MD  mupirocin ointment (BACTROBAN) 2 % Apply 1 application topically daily. 06/16/20   Clark-Burning, Anderson Malta, PA-C  oxyCODONE (OXY IR/ROXICODONE) 5 MG immediate release tablet Take 1 tablet (5 mg total) by mouth every 6 (six) hours as needed for moderate pain or severe pain. 12/16/20 01/15/21  Lenoria Chime, MD  promethazine (PHENERGAN) 12.5 MG tablet TAKE 1 TABLET(12.5 MG) BY MOUTH EVERY 6 HOURS AS NEEDED FOR NAUSEA OR VOMITING 01/31/20   McDiarmid, Blane Ohara, MD  promethazine (PHENERGAN) 25 MG suppository Place 1 suppository (25 mg total) rectally every 6 (six) hours as needed for nausea or vomiting. 12/06/19   McDiarmid, Blane Ohara, MD  QUEtiapine (SEROQUEL) 50 MG tablet Take 1  tablet (50 mg total) by mouth at bedtime. 10/15/20   Lind Covert, MD  senna (SENOKOT) 8.6 MG TABS tablet Take 2 tablets (17.2 mg total) by mouth daily as needed for moderate constipation. 12/06/19   McDiarmid, Blane Ohara, MD    Allergies    Bee venom, Tegaderm ag mesh [silver], Acetaminophen, Novolog [insulin aspart], Statins, Flexeril [cyclobenzaprine], Lyrica [pregabalin], Nsaids, Tape, Ceftriaxone, and Erythromycin  Review of  Systems   Review of Systems  Constitutional: Negative for fever.  Cardiovascular: Negative for chest pain.  Gastrointestinal: Positive for abdominal pain, nausea and vomiting. Negative for blood in stool, diarrhea, hematemesis and hematochezia.  Genitourinary: Negative for hematuria.  All other systems reviewed and are negative.   Physical Exam Updated Vital Signs BP (!) 231/93 (BP Location: Left Arm) Comment: Triage RN notified  Pulse (!) 115   Temp 98.4 F (36.9 C) (Oral)   Resp (!) 22   SpO2 100%   Physical Exam CONSTITUTIONAL: Well developed anxious and writhing around on the bed HEAD: Normocephalic/atraumatic EYES: EOMI/PERRL, no icterus ENMT: Mucous membranes dry NECK: supple no meningeal signs SPINE/BACK:entire spine nontender CV: S1/S2 noted, no murmurs/rubs/gallops noted LUNGS: Lungs are clear to auscultation bilaterally, no apparent distress ABDOMEN: soft, mild epigastric tenderness, no rebound or guarding, bowel sounds noted throughout abdomen GU:no cva tenderness NEURO: Pt is awake/alert/appropriate, moves all extremitiesx4.  No facial droop.   EXTREMITIES: pulses normal/equal, full ROM SKIN: warm, color normal PSYCH: Anxious  ED Results / Procedures / Treatments   Labs (all labs ordered are listed, but only abnormal results are displayed) Labs Reviewed  COMPREHENSIVE METABOLIC PANEL - Abnormal; Notable for the following components:      Result Value   Glucose, Bld 66 (*)    BUN 26 (*)    Creatinine, Ser 2.29 (*)    GFR, Estimated 25 (*)    All other components within normal limits  URINALYSIS, ROUTINE W REFLEX MICROSCOPIC - Abnormal; Notable for the following components:   Color, Urine STRAW (*)    Glucose, UA 50 (*)    Hgb urine dipstick SMALL (*)    Protein, ur 100 (*)    Bacteria, UA RARE (*)    All other components within normal limits  I-STAT BETA HCG BLOOD, ED ([facility], [facility], [facility] ONLY) - Abnormal; Notable for the following components:   I-stat hCG,  quantitative 10.4 (*)    All other components within normal limits  CBG MONITORING, ED - Abnormal; Notable for the following components:   Glucose-Capillary 271 (*)    All other components within normal limits  RESP PANEL BY RT-PCR (FLU A&B, COVID) ARPGX2  LIPASE, BLOOD  CBC    EKG EKG Interpretation  Date/Time:  Wednesday December 17 2020 03:09:39 EST Ventricular Rate:  125 PR Interval:    QRS Duration: 90 QT Interval:  327 QTC Calculation: 472 R Axis:   88 Text Interpretation: Sinus tachycardia Consider right atrial enlargement Anterior infarct, old No significant change since last tracing Confirmed by Ripley Fraise [phone]) on 12/17/2020 3:22:31 AM   Radiology No results found.  Procedures Procedures    Medications Ordered in ED Medications  haloperidol lactate (HALDOL) injection 2 mg (2 mg Intravenous Given 12/17/20 0327)  sodium chloride 0.9 % bolus 1,000 mL (0 mLs Intravenous Stopped 12/17/20 0552)  LORazepam (ATIVAN) injection 1 mg (1 mg Intravenous Given 12/17/20 0426)  HYDROmorphone (DILAUDID) injection 1 mg (1 mg Intravenous Given 12/17/20 0631)    ED Course  I have reviewed the triage  vital signs and the nursing notes.  Pertinent labs results that were available during my care of the patient were reviewed by me and considered in my medical decision making (see chart for details).    MDM Rules/Calculators/A&P                          3:19 AM Patient reports long history of episodes with intractable vomiting and upper abdominal pain. Patient reports Dilaudid and Ativan and Phenergan help her symptoms. We will start with Haldol & reassess. Labs overall consistent with baseline.  Will defer imaging for now as she has had multiple CT scans in the past 5:53 AM Patient has received multiple doses of medications including Haldol, Ativan. Patient reports continued pain and nausea.  She reports difficulty taking p.o.  On abdominal exam she has mild epigastric  tenderness but otherwise abdomen is soft and nontender.  Patient reports she had a bowel movement while in the waiting room and it was normal consistency. Due to long history of similar symptoms, will defer any imaging at this time.  However will consult family medicine for admission due to intractable vomiting 6:34 AM Discussed with family medicine resident Nita Sells, will admit to their service  Final Clinical Impression(s) / ED Diagnoses Final diagnoses:  Intractable vomiting with nausea, unspecified vomiting type  Renal insufficiency    Rx / DC Orders ED Discharge Orders    None       Ripley Fraise, MD 12/17/20 [phone]

## 2020-12-17 NOTE — ED Notes (Signed)
Pt sppears to be sleeping in supine position. Chest with equal rise and fall. Bedside cardiac monitor on. No problem to note at this time.

## 2020-12-17 NOTE — Progress Notes (Signed)
FPTS Interim Progress Note  S:spoke with pt who states vomiting is better. Still having nausea and abdominal pain is about the same (8.5/10).  Pt desiring ice chips.  O: BP (!) 163/80 (BP Location: Left Arm)   Pulse 80   Temp 98.4 F (36.9 C) (Oral)   Resp 19   SpO2 99%   Gen: awake.  Oriented.  No acute distress.  CV: RRR.  Pulm: lctab Gi: soft, tender to very light palpation epigastrically.  Normal bowel sounds.  Ext: no pedal edema  A/P: Vomiting - continue current mgmt as outlined in H&P from this morning.  Will put on clear liquid diet and advance as tolerated.   Benay Pike, MD 12/17/2020, 2:11 PM PGY-3, [facility] pager [phone]

## 2020-12-17 NOTE — Plan of Care (Signed)

## 2020-12-17 NOTE — Evaluation (Signed)
Physical Therapy Evaluation Patient Details Name: [name] [name] MRN: [id number] DOB: [birth year] Today's Date: 12/17/2020   History of Present Illness  54 y.o. female presenting with intractable vomiting. PMH is significant for HTN, LV diastolic dysfunction, allergic rhinitis, cyclic vomiting syndrome, T2DM with gastroparesis and neuropathy, GERD with esophagitis, CKD stage 4, anxiety, cannabis use, tobacco abuse.  Clinical Impression  Pt presents to PT with deficits in R shoulder strength, endurance, gait, and balance. Pt reports chronic R weakness in shoulder which has been present since R shoulder surgery ~1.5 years ago. Pt is unable to lift heavier objects with this arm and compensates by utilizing more elbow flexion strength rather than shoulder musculature. Pt is able to mobilize well during session but also reports LLE fatigue which started one month ago. Pt with some mild instability with gait but does not require physical assistance to maintain balance. Pt will benefit from acute PT POC to progress R shoulder strength and to aide in improving activity tolerance while admitted. PT recommends outpatient PT for R shoulder strengthening at the time of discharge.    Follow Up Recommendations Outpatient PT (for R shoulder strengthening)    Equipment Recommendations  None recommended by PT    Recommendations for Other Services       Precautions / Restrictions Precautions Precautions: Fall Restrictions Weight Bearing Restrictions: No      Mobility  Bed Mobility Overal bed mobility: Independent                  Transfers Overall transfer level: Independent                  Ambulation/Gait Ambulation/Gait assistance: Supervision Gait Distance (Feet): 150 Feet Assistive device: None Gait Pattern/deviations: Step-through pattern Gait velocity: functional Gait velocity interpretation: >2.62 ft/sec, indicative of community ambulatory General Gait Details: pt with  step through gait, slight increase in lateral sway. Pt reports fatigue in LLE although no significant weakness noted  Stairs            Wheelchair Mobility    Modified Rankin (Stroke Patients Only)       Balance Overall balance assessment: Mild deficits observed, not formally tested                                           Pertinent Vitals/Pain Pain Assessment: Faces Faces Pain Scale: Hurts little more Pain Location: feet Pain Descriptors / Indicators: Grimacing Pain Intervention(s): Monitored during session    Home Living Family/patient expects to be discharged to:: Private residence Living Arrangements: Alone Available Help at Discharge: Family;Available PRN/intermittently Type of Home: House Home Access: Stairs to enter Entrance Stairs-Rails: None Entrance Stairs-Number of Steps: 2 Home Layout: One level;Laundry or work area in [place]: [name] - single point      Prior Function Level of Independence: Independent         Comments: pt reports some weakness in RUE which limits her ability to lift, also LLE fatigue over the last month, reports she may have been bitten by a spider     Hand Dominance   Dominant Hand: Right    Extremity/Trunk Assessment   Upper Extremity Assessment Upper Extremity Assessment: RUE deficits/detail RUE Deficits / Details: ROM WFL, shoulder flexion 4/5, otherwise 4+/5. Pt lifts heavier things in a supinated position, utilizing bicep and elbow flexors rather than shoulder musculature    Lower  Extremity Assessment Lower Extremity Assessment: Overall WFL for tasks assessed    Cervical / Trunk Assessment Cervical / Trunk Assessment: Normal  Communication   Communication: No difficulties  Cognition Arousal/Alertness: Awake/alert Behavior During Therapy: WFL for tasks assessed/performed Overall Cognitive Status: Within Functional Limits for tasks assessed                                         General Comments General comments (skin integrity, edema, etc.): VSS on RA, pt with black wound on L lateral malleolus, reports she felt she was bit by something ~1 month ago, led to blistering which has since popped. 1" diameter black circular wound present    Exercises     Assessment/Plan    PT Assessment Patient needs continued PT services  PT Problem List Decreased strength;Decreased balance;Decreased activity tolerance       PT Treatment Interventions Gait training;Balance training;Neuromuscular re-education;Therapeutic exercise    PT Goals (Current goals can be found in the Care Plan section)  Acute Rehab PT Goals Patient Stated Goal: to be able to lift with RUE and not compensate PT Goal Formulation: With patient Time For Goal Achievement: 12/31/20 Potential to Achieve Goals: Fair Additional Goals Additional Goal #1: Pt will score >19/24 on DGI to indicate a reduced falls risk    Frequency Min 3X/week   Barriers to discharge        Co-evaluation               AM-PAC PT "6 Clicks" Mobility  Outcome Measure Help needed turning from your back to your side while in a flat bed without using bedrails?: None Help needed moving from lying on your back to sitting on the side of a flat bed without using bedrails?: None Help needed moving to and from a bed to a chair (including a wheelchair)?: None Help needed standing up from a chair using your arms (e.g., wheelchair or bedside chair)?: None Help needed to walk in hospital room?: A Little Help needed climbing 3-5 steps with a railing? : A Little 6 Click Score: 22    End of Session   Activity Tolerance: Patient tolerated treatment well Patient left: in bed;with call bell/phone within reach;with bed alarm set Nurse Communication: Mobility status PT Visit Diagnosis: Muscle weakness (generalized) (M62.81)    Time: 7048-8891 PT Time Calculation (min) (ACUTE ONLY): 21 min   Charges:   PT  Evaluation $PT Eval Low Complexity: [place], PT, DPT Acute Rehabilitation Pager: [phone]   Zenaida Niece 12/17/2020, 5:27 PM

## 2020-12-17 NOTE — H&P (Addendum)
[facility] Admission History and Physical Service Pager: [phone]  Patient name: [name] [name] Medical record number: [id number] Date of birth: 13-Feb-1966 Age: 54 y.o. Gender: female  Primary Care Provider: McDiarmid, Blane Ohara, MD Consultants: None Code Status: Full (please readdress when patient more alert) Preferred Emergency Contact: Jessica 9Daughter) [phone]  Chief Complaint: Nausea, vomiting  Assessment and Plan: [name] [name] is a 54 y.o. female presenting with intractable vomiting. PMH is significant for HTN, LV diastolic dysfunction, allergic rhinitis, cyclic vomiting syndrome, T2DM with gastroparesis and neuropathy, GERD with esophagitis, CKD stage 4, anxiety, cannabis use, tobacco abuse.   Cyclic Vomiting, likely 2/2 Marijuana Abuse  Patient seen at [facility] yesterday, 12/21, for intractable vomiting. Referred to ED for further workup/management and to rule out obstruction. Imaging not performed in ED due to history of similar symptoms with previous unremarkable imaging. Additionally, patient had BM while in waiting room making obstruction less likely. Patient has received 25 mg Phenergan x1, Haldol x1, Ativan x1 and Dilaudid x1. Lipase and CBC WNL so doubt infection or pancreatitis as cause. I-stat hCG elevated to 10.4. UA with 50 glucose, small hgb, 100 protein and rare bacteria. On exam noted to have tenderness over LLQ and epigastric area.   - Admit for observation, attending Dr. Andria Frames  - Vitals per floor protocol - NPO  - Advance diet as tolerated - NS with 5% dextrose, rate 75 mL/hr  - IV Phenergan 25 mg q6h  - Pain control with Capsaicin cream  - Consider pain cocktail with Ativan 51m q4h and Dilaudid 1 mq 4h if continues to have worsening symptoms.  - Encourage cessation of THC - PT/OT evaluation - Incentive spirometer q2h while awake  T2DM with gastroparesis, neuropathy  Hypoglycemic to 66 on CMP. CBG 271. Home medications include  jardiance 25 mg, Humalog 2 units after dinner.  Last A1c 09/20/2019 was 7.4. - Hgb A1c pending - CBG QID while NPO, AC and QHS when eating - Holding Lantus and ssI as patient NPO  - Hold Jardiance, restart when able to tolerate po  CKD Stage 4: chronic, stable Creatinine 2.29, GFR 25. Appears baseline is about 2.2. - Avoid nephrotoxic agents as able  - Monitor with daily BMP  Hematemesis: stable.  Likely secondary to intractable vomiting Reported episodes yesterday.  None today since coming to hospital.  VSS.  No obvious signs of bleeding.  CBC unremarkable.   -Phenergan 25 mg IV -Continue to monitor -CBC in am  HTN: chronic, stable On carvedilol 6.25 mg. BP's significantly elevated as high as 231/93 while in the ED.  - Vitals per floor protocol -Continue Carvedilol 6.25 mg, if unable to tolerate po can switch to Metoprolol IV   Anxiety Disorder  Mood disorder: chronic, stable Home medications include Seroquel 50 mg nightly, Ativan 1 mg q6h PRN anxiety. - Continue home Seroquel.   Marijuana Abuse  Tobacco Abuse  Likely contributing to cyclic vomiting.  - Nicotine patch PRN - Encourage cessation   FEN/GI: NPO Prophylaxis: Lovenox   Disposition:  Admit to Med Tele, attending Dr HAndria Frames History of Present Illness:  [name] RUSSMANis a 54y.o. female presenting with cyclic vomiting x since yesterday  Reports went to bathroom and after BM had episode of feeling hot. Shortly thereafter started to vomit.  Endorses 6 episodes of emesis with some abdominal pain.  Also noted that had bloody vomit a couple of times.  Has had no vomiting since coming to hospital.  She  reports that she was able to keep down some ginger ale today.  Denies any chest pain, SOB, diarrhea, constipation or bloody stool. Took Ativan and BP medications without relief.  Reports that hot showers sometimes helps relieve the nausea and abdominal pain for short intervals.  She reports having similar symptoms  previously and has had multiple hospitalizations for same.    She reports a weight loss int he past year of 50 lbs, some was intentional.   Last colonoscopy 10/2019 and had three polyps removed.  Follow up for repeat in 3 years.  ED Course: Patient brought from [place] [facility] via [facility] for intractable vomiting and upper abdominal pain. Received Haldol and Ativan. CT imaging deferred due to multiple scans in the past. Low concern for obstruction as patient had BM while waiting. Epigastric tenderness on examination. Dilaudid 1 mg x1 for continued pain/N/V.  Review Of Systems: Per HPI with the following additions:   Review of Systems  Constitutional: Positive for diaphoresis. Negative for fever.  Respiratory: Negative for cough, chest tightness and shortness of breath.   Cardiovascular: Negative for chest pain and leg swelling.  Gastrointestinal: Positive for abdominal pain, nausea and vomiting. Negative for blood in stool.       Hematemesis  Genitourinary: Negative for difficulty urinating and hematuria.  Neurological: Negative for dizziness, weakness and headaches.     Patient Active Problem List   Diagnosis Date Noted  . Mood disorder ([place])   . Skin disorder 05/23/2020  . Tubular adenoma of colon 12/03/2019  . Continuous cannabis use 11/08/2019  . Left ventricular diastolic dysfunction with preserved systolic function 43/15/4008  . Intractable vomiting 08/12/2018  . Primary income source is SS Disability Income 03/10/2017  . Port-A-Cath in place 01/20/2017  . Secondary hyperparathyroidism of renal origin ([place]) 10/05/2016  . Essential hypertension 05/01/2016  . Diabetic gastroparesis associated with type 2 diabetes mellitus ([place]) 05/01/2016  . DM (diabetes mellitus), type 2 with complications ([place])   . Normocytic anemia   . Intermittent constipation 06/06/2015  . Encounter for monitoring opioid maintenance therapy 01/06/2015  . Vitamin D deficiency 11/28/2014  . Pure  hypercholesterolemia 08/15/2014  . Chronic kidney disease (CKD) stage G4/A2, severely decreased glomerular filtration rate (GFR) between 15-29 mL/min/1.73 square meter and albuminuria creatinine ratio between 30-299 mg/g (HCC) 08/17/2013  . Allergic rhinitis, seasonal 05/04/2012  . Cyclic vomiting syndrome 08/03/2011  . Neck muscle spasm 01/01/2011  . Chronic bilateral low back pain 04/10/2007  . Anxiety disorder 02/23/2007  . Tobacco abuse 02/23/2007  . Chronic insomnia secondary to General Medical and Mood disorders 02/23/2007  . Diabetic autonomic neuropathy ([place]) 02/23/2007  . GERD with esophagitis 01/27/2004    Past Medical History: Past Medical History:  Diagnosis Date  . Abdominal pain, generalized   . Acquired bilateral hammer toes 01/20/2017  . Acute post-traumatic stress disorder 11/17/2018  . AKI (acute kidney injury) ([place])   . Allergy   . Anemia   . Anxiety   . Arthritis   . At risk for polypharmacy 08/15/2014  . Biceps tendinopathy of right upper extremity 04/05/2014  . Blood transfusion 2003  . Cataract    both eyes  . Chronic generalized abdominal pain   . Chronic kidney disease (CKD), stage III (moderate) (HCC)   . Chronic leg cramping 07/18/2014  . CKD (chronic kidney disease) 08/17/2013   Seen by Dr Joelyn Oms 07/28/15 ([place]) - (see scanned document): 20 years diabetes, 15 years HTN, stable CKD stage III with proteinuria.  -  CKD, discussed NSAID avoidance, diabetes control with target A1c<7.5%, and HTN control with SBP<140. F/u 6 months and repeat renal panel at that time. - Microalbuminuria: On lisinopril 79m, suspected secondary due to diabetic nephropathy. - Chronic back pain: No NSDAIDs. - Say amlodipine causes nausea  - Advised not to ACE-i during vomiting episodes    . Complication of anesthesia    "problems waking up"  . Cyclic vomiting syndrome   . Delayed gastric emptying 05/12/2011    04/12/11 NUCLEAR MEDICINE GASTRIC EMPTYING SCAN  Radiopharmaceutical: 2.0 mCi [id number] colloid. Comparison: None. Findings: At 1 hour, 81% of the counts remain in the stomach. At 2 hours, 53% remain. Normal is less than 30% at 2 hours. IMPRESSION: Moderate delay in gastric emptying.      . Depression   . Depression with anxiety 02/23/2007   See [facility] Note dated 05/06/2016.  IC (KGwenlyn Saranwas called in and I did a brief assessment.   . Diabetic gastroparesis ([facility]   /e-chart  . Diabetic ketoacidosis without coma associated with type 2 diabetes mellitus ([place]  . DKA (diabetic ketoacidoses) 08/03/2016  . Elevated cholesterol 11/28/2015  . Emotional stress 12/30/2015   Stress after Breakup with partnes of 13 years on 12/27/2015   . Encounter for chronic pain management 01/06/2015   Indication for chronic opioid: Chronic back pain, cyclic vomiting Medication and dose: oxycodone 70m37m# pills per month: 15 Last UDS date: 05/08/2015 - result pending Pain contract signed (Y/N): Yes, reviewed 01/06/2015  Date narcotic database last reviewed (include red flags): 05/08/2015 (no red flags).    . Erosive esophagitis 08/06/2019  . Esophagitis 04/18/2013   Documented by Dr. PerHenrene PastorD 03/2011  He also consulted on her during hospitalization 03/21/3086r cyclical vomiting. No repeat EGD necessary, treat esophagitis with PPI.    . EMarland Kitchenophagitis determined by endoscopy 04/18/2013   Documented by Dr. PerHenrene PastorD 03/2011  He also consulted on her during hospitalization 03/57/8469r cyclical vomiting. No repeat EGD necessary, treat esophagitis with PPI.    . EMarland Kitchensential hypertension, malignant 02/23/2007   Jan 2015 - stopped recently-started norvasc due to reports of GI symptoms. 02/2015 - holding lisinopril, adding hydralazine 43m76mD, and will take 1/2 tab lisinopril when feeling better when not in cyclic vomiting flare   . GERD (gastroesophageal reflux disease)   . Grief reaction 10/16/2018  . Heart murmur   . Hematuria 07/09/2015  . High risk social situation 08/31/2012   . Hip pain, bilateral 03/31/2018  . History of colonic polyps 11/15/2018   10/2017 screening colonoscopy Dr JohnScarlette Shortsven polyps were found in the sigmoid colon, transverse colon, ascending colon and cecum. The polyps were 2 to 10 mm in size. These polyps were removed with a cold snare.   . HiMarland Kitchentory of multiple colonic tubular adenomas 11/15/2018   10/2019 Surveillance colonoscopy Dr JohnScarlette Shortsnd five (1-3 mm) polyps that were removed by cold snares (polypectomies). Polyps in ascending colon and cecum. Histopathology:TUBULAR ADENOMA, NEGATIVE FOR HIGH GRADE DYSPLASIA (4).  Recommendation repeat surv. c-scopy in 3 yrs.  10/2017 screening colonoscopy Dr JohnScarlette Shortsven tubular adenomas were found in the sigmoid colon, transverse colon, a  . Hot flashes 07/29/2011  . Hypertension   . Incontinence 10/03/2017  . INSOMNIA NOS 02/23/2007   Qualifier: Diagnosis of  By: HarrBeryle Lathe. Intermittent constipation 06/06/2015  . Intestinal impaction (HCC)[place]. Intractable vomiting 07/02/2016  . Marijuana smoker, continuous  pt. denies does smoke marijuana  . MIGRAINE, UNSPEC., W/O INTRACTABLE MIGRAINE 02/23/2007   Qualifier: Diagnosis of  By: Beryle Lathe    . Myocardial infarction [facility]) 07/2007   "they say I've had a silent one; I don't know"  . Nausea and vomiting in adult 06/06/2013  . Neuromuscular disorder (HCC)    neuropathy in both legs  . NEUROPATHY, DIABETIC 02/23/2007  . Nonspecific low back pain 04/10/2007   Back pain is chronic. Oxycodone per prior notes helps pt be more active. Reportedly uses 4 tab/week oxy for intermittent worsening of chronic back pain but mostly for cyclic vom to keep her out of hospital.    . Normocytic anemia   . Onychomycosis 03/25/2015  . PANCREATITIS 05/09/2008  . Pneumonia   . Pre-ulcerative calluses 05/27/2017  . Primary income source is SS Disability Income 03/10/2017   Disability SSI  . Protein-calorie malnutrition, severe ([place]) 12/17/2013  . Pure  hypercholesterolemia 08/15/2014   Based on her choleserol, BP, smoker status, and that she is a diabetic, her 10 year ASCVD risk is 9.9% putting her in a category of risk that would benefit from high-intensity statin.    Marland Kitchen Pyelonephritis 06/09/2015   stranding about kidney on CT AP   . Recurrent major depressive disorder ([place])   . Rotator cuff syndrome 04/19/2014   rt. shoulder  . Rotator cuff tendonopathies with partial tear, right 03/31/2018   05/23/18 MRI: 1. Severe tendonosis of the supraspinatus tendon with a partial-thickness bursal surface tear.     2. Mild tendinosis of the infraspinatus tendon.     3. Mild tendinosis of the subscapularis tendon  . Smoker   . Stye external 09/02/2017  . TOBACCO DEPENDENCE 02/23/2007   2 cigarettes every few days 11/2012 3 cigarettes daily    . Type II diabetes mellitus ([place])   . Uncontrolled secondary diabetes mellitus with stage 3 CKD (GFR 30-59) (HCC) 10/23/2011   Nephropathy, gastroparesis  HgbA1c (06/2011) 7.6, 9.5, 12.2, 8.2, 10.9 (07/2012), 9.2 (02/11/2013), 7.7 (09/27/13)  Metformin: she has a history of cyclical vomiting; she would prefer not to take this medication  On lanuts 20U qam with improved control. 09/27/13   . Vitamin D deficiency 11/28/2014    Past Surgical History: Past Surgical History:  Procedure Laterality Date  . ABDOMINAL HYSTERECTOMY  02/2002  . CARDIAC CATHETERIZATION    . CATARACT EXTRACTION W/ INTRAOCULAR LENS  IMPLANT, BILATERAL    . CESAREAN SECTION     x 3  . CHOLECYSTECTOMY  1980's  . COLONOSCOPY     patient not sure  . DILATION AND EVACUATION  X3  . laparotomy and lysis of adhesions    . left hand surgery    . PORT-A-CATH placement  ~ 2008   right chest; "poor access; frequent hospitalizations"  . SHOULDER ARTHROSCOPY Right 07/21/2019   [facility] (Ortho): Arthroscopic rotator cuff repair; Arthroscopic distal clavicle resection; Arthroscopic subacromial decompression and bursectomy;  Debridement of degenerative  labral tear  . SMALL INTESTINE SURGERY     laporotomy with lysis of adhesions.  Marland Kitchen UPPER GI ENDOSCOPY      Social History: Social History   Tobacco Use  . Smoking status: Current Some Day Smoker    Packs/day: 0.50    Years: 22.00    Pack years: 11.00    Types: Cigarettes    Start date: 12/27/1982  . Smokeless tobacco: Never Used  Vaping Use  . Vaping Use: Never used  Substance Use Topics  . Alcohol use:  Yes    Comment: occaisionally; pt reports drinking beer to help her when constipated  . Drug use: Yes    Types: Marijuana    Comment: last use 2 joints on 11/19/19   Additional social history: THC daily Please also refer to relevant sections of EMR.  Family History: Family History  Problem Relation Age of Onset  . Diabetes type II Mother   . Hypertension Mother   . Heart attack Mother   . Other Mother        Leg amputation  . Kidney disease Mother        Required dialysis 10 years  . Diabetes type II Sister   . Diabetes Sister   . Hypertension Sister   . Alcohol abuse Father   . Diabetes Sister   . Diabetes Sister   . Cancer Sister 1       Brain  . Colon cancer Neg Hx   . Colon polyps Neg Hx   . Esophageal cancer Neg Hx   . Rectal cancer Neg Hx   . Stomach cancer Neg Hx     Allergies and Medications: Allergies  Allergen Reactions  . Bee Venom Anaphylaxis    Required hospital visit to ER  . Tegaderm Ag Mesh [Silver] Dermatitis    And S[place]av[place] tape- causes blisters and itching  . Acetaminophen Nausea And Vomiting  . Novolog [Insulin Aspart] Other (See Comments)    Muscles in feet cramp  . Statins Other (See Comments)    GI intolerance (N/V)  . Flexeril [Cyclobenzaprine] Nausea And Vomiting  . Lyrica [Pregabalin] Other (See Comments)    Leg cramping  . Nsaids   . Tape     Blisters  . Ceftriaxone Other (See Comments)    Caused an ulcer in her mouth  . Erythromycin Nausea And Vomiting   No current facility-administered medications on file prior to  encounter.   Current Outpatient Medications on File Prior to Encounter  Medication Sig Dispense Refill  . B-D UF III MINI PEN NEEDLES 31G X 5 MM MISC CHECK BLOOD GLUCOSE THREE TIMES DAILY AS DIRECTED 100 each PRN  . lubiprostone (AMITIZA) 24 MCG capsule TAKE 1 CAPSULE(24 MCG) BY MOUTH TWICE DAILY WITH A MEAL 90 capsule 3  . ACCU-CHEK AVIVA PLUS test strip USE AS DIRECTED 100 strip 11  . Accu-Chek Softclix Lancets lancets USE AS DIRECTED UP TO FOUR TIMES DAILY. 300 each prn  . blood glucose meter kit and supplies KIT Dispense based on patient and insurance preference. Use up to four times daily as directed. (FOR ICD-9 250.00, 250.01). 1 each 0  . carvedilol (COREG) 6.25 MG tablet TAKE 1 TABLET BY MOUTH TWICE DAILY WITH MEALS 180 tablet 1  . Diclofenac Sodium-Menthol 1.5 & 10 % THPK Apply 4 g topically 4 (four) times daily. 239 mL 5  . empagliflozin (JARDIANCE) 25 MG TABS tablet Take 25 mg by mouth daily. 90 tablet 3  . insulin lispro (HUMALOG) 100 UNIT/ML KwikPen Inject 2 units subcutaneous only after eating at least half of your dinner each day. 15 mL 11  . LANTUS SOLOSTAR 100 UNIT/ML Solostar Pen ADMINISTER 20 UNITS UNDER THE SKIN DAILY 18 mL 5  . LORazepam (ATIVAN) 1 MG tablet Take 1 tablet (1 mg total) by mouth every 6 (six) hours as needed for anxiety. 35 tablet 0  . mupirocin ointment (BACTROBAN) 2 % Apply 1 application topically daily. 22 g 1  . oxyCODONE (OXY IR/ROXICODONE) 5 MG immediate release tablet Take 1 tablet (  5 mg total) by mouth every 6 (six) hours as needed for moderate pain or severe pain. 35 tablet 0  . promethazine (PHENERGAN) 12.5 MG tablet TAKE 1 TABLET(12.5 MG) BY MOUTH EVERY 6 HOURS AS NEEDED FOR NAUSEA OR VOMITING 40 tablet 3  . promethazine (PHENERGAN) 25 MG suppository Place 1 suppository (25 mg total) rectally every 6 (six) hours as needed for nausea or vomiting. 30 each 3  . QUEtiapine (SEROQUEL) 50 MG tablet Take 1 tablet (50 mg total) by mouth at bedtime. 90 tablet  1  . senna (SENOKOT) 8.6 MG TABS tablet Take 2 tablets (17.2 mg total) by mouth daily as needed for moderate constipation. 120 tablet prn    Objective: BP (!) 146/84 (BP Location: Right Arm)   Pulse (!) 101   Temp 97.9 F (36.6 C) (Oral)   Resp 15   SpO2 96%  Exam:  General: Alert and oriented, no apparent distress  Eyes: PEERLA ENTM: No pharyengeal erythema Neck: nontender Cardiovascular: RRR with no murmurs noted, no lower extremity edema Respiratory: CTA bilaterally  Gastrointestinal: Bowel sounds present. LLQ and epigastric tenderness. MSK: Upper extremity strength 5/5 bilaterally, Lower extremity strength 5/5 bilaterally  Derm: No rashes noted, RU chest Port access Neuro: CN III-XII intact, motor, sensory intact.   Psych: Behavior and speech appropriate to situation  Labs and Imaging: CBC BMET  Recent Labs  Lab 12/16/20 1730  WBC 6.5  HGB 12.0  HCT 36.8  PLT 207   Recent Labs  Lab 12/16/20 1730  NA 143  K 4.5  CL 109  CO2 24  BUN 26*  CREATININE 2.29*  GLUCOSE 66*  CALCIUM 10.0     EKG: Sinus tachy 125 bpm    Carollee Leitz, MD 12/17/2020, 9:14 AM PGY-2, [place] Intern pager: [phone], text pages welcome

## 2020-12-18 ENCOUNTER — Other Ambulatory Visit: Payer: Self-pay | Admitting: Family Medicine

## 2020-12-18 DIAGNOSIS — R112 Nausea with vomiting, unspecified: Secondary | ICD-10-CM | POA: Diagnosis not present

## 2020-12-18 DIAGNOSIS — N289 Disorder of kidney and ureter, unspecified: Secondary | ICD-10-CM | POA: Diagnosis not present

## 2020-12-18 LAB — BASIC METABOLIC PANEL
Anion gap: 11 (ref 5–15)
BUN: 31 mg/dL — ABNORMAL HIGH (ref 6–20)
CO2: 24 mmol/L (ref 22–32)
Calcium: 9.3 mg/dL (ref 8.9–10.3)
Chloride: 108 mmol/L (ref 98–111)
Creatinine, Ser: 2.58 mg/dL — ABNORMAL HIGH (ref 0.44–1.00)
GFR, Estimated: 21 mL/min — ABNORMAL LOW (ref >60)
Glucose, Bld: 69 mg/dL — ABNORMAL LOW (ref 70–99)
Potassium: 4 mmol/L (ref 3.5–5.1)
Sodium: 143 mmol/L (ref 135–145)

## 2020-12-18 LAB — CBC
HCT: 31.5 % — ABNORMAL LOW (ref 36.0–46.0)
Hemoglobin: 10.4 g/dL — ABNORMAL LOW (ref 12.0–15.0)
MCH: 30.9 pg (ref 26.0–34.0)
MCHC: 33 g/dL (ref 30.0–36.0)
MCV: 93.5 fL (ref 80.0–100.0)
Platelets: 192 10*3/uL (ref 150–400)
RBC: 3.37 MIL/uL — ABNORMAL LOW (ref 3.87–5.11)
RDW: 14.6 % (ref 11.5–15.5)
WBC: 9.4 10*3/uL (ref 4.0–10.5)
nRBC: 0 % (ref 0.0–0.2)

## 2020-12-18 LAB — GLUCOSE, CAPILLARY
Glucose-Capillary: 43 mg/dL — CL (ref 70–99)
Glucose-Capillary: 84 mg/dL (ref 70–99)
Glucose-Capillary: 189 mg/dL — ABNORMAL HIGH (ref 70–99)
Glucose-Capillary: 76 mg/dL (ref 70–99)

## 2020-12-18 MED ORDER — LORAZEPAM 1 MG PO TABS: 1.0000 mg | ORAL_TABLET | Freq: Four times a day (QID) | ORAL | Status: DC | PRN

## 2020-12-18 MED ORDER — PROMETHAZINE HCL 25 MG PO TABS
12.5000 mg | ORAL_TABLET | Freq: Four times a day (QID) | ORAL | Status: DC | PRN
  Filled 2020-12-18: qty 1

## 2020-12-18 MED ORDER — HEPARIN SOD (PORK) LOCK FLUSH 100 UNIT/ML IV SOLN
500.0000 [IU] | INTRAVENOUS | Status: AC | PRN
  Administered 2020-12-18: 500 [IU]
  Filled 2020-12-18: qty 5

## 2020-12-18 MED ORDER — OXYCODONE HCL 5 MG PO TABS
5.0000 mg | ORAL_TABLET | Freq: Four times a day (QID) | ORAL | Status: DC | PRN
  Administered 2020-12-18: 5 mg via ORAL
  Filled 2020-12-18: qty 1

## 2020-12-18 MED ORDER — PANTOPRAZOLE SODIUM 40 MG PO TBEC
40.0000 mg | DELAYED_RELEASE_TABLET | Freq: Every day | ORAL | Status: DC
  Administered 2020-12-18: 10:00:00 40 mg via ORAL
  Filled 2020-12-18: qty 1

## 2020-12-18 MED ORDER — CHLORHEXIDINE GLUCONATE CLOTH 2 % EX PADS
6.0000 | MEDICATED_PAD | Freq: Every day | CUTANEOUS | Status: DC
  Administered 2020-12-18: 10:00:00 6 via TOPICAL

## 2020-12-18 MED ORDER — PANTOPRAZOLE SODIUM 40 MG PO TBEC: 40.0000 mg | DELAYED_RELEASE_TABLET | Freq: Every day | ORAL | 1 refills | Status: DC

## 2020-12-18 MED ORDER — SODIUM CHLORIDE 0.9% FLUSH
10.0000 mL | Freq: Two times a day (BID) | INTRAVENOUS | Status: DC
  Administered 2020-12-18: 10:00:00 10 mL

## 2020-12-18 NOTE — Evaluation (Signed)
Occupational Therapy Evaluation Patient Details Name: [name] [name] MRN: [id number] DOB: 09-30-1966 Today's Date: 12/18/2020    History of Present Illness 54 y.o. female presenting with intractable vomiting. PMH is significant for HTN, LV diastolic dysfunction, allergic rhinitis, cyclic vomiting syndrome, T2DM with gastroparesis and neuropathy, GERD with esophagitis, CKD stage 4, anxiety, cannabis use, tobacco abuse.   Clinical Impression   Pt admitted with the above diagnoses and presents with below problem list. Pt will benefit from continued acute OT to address the below listed deficits and maximize independence with basic ADLs prior to d/c home. Pt presents with decreased activity tolerance impacting management of ADLs. Up to supervision assist needed for ADLs. Pt endorses feelings of acute on chronic anxiety/depression, h/o multiple traumas throughout her life. Provided therapeutic listnening, encouraged walking 3x/day (distance per toleration), encouraged pt to pursue community mental health resources. Per secure-chat with CM after eval, CM to provide list of mental health resources. Plan to follow acutely.     Follow Up Recommendations  Other (comment) (OP PT vs OP OT to address R shoulder deficits)    Equipment Recommendations  None recommended by OT    Recommendations for Other Services Other (comment) (encouraged pt to pursue mental health resources, CM to provide list of resources)     Precautions / Restrictions Precautions Precautions: Fall Restrictions Weight Bearing Restrictions: No      Mobility Bed Mobility Overal bed mobility: Independent                  Transfers Overall transfer level: Independent                    Balance Overall balance assessment: Mild deficits observed, not formally tested                                         ADL either performed or assessed with clinical judgement   ADL                                                Vision         Perception     Praxis      Pertinent Vitals/Pain Pain Assessment: Faces Faces Pain Scale: Hurts little more Pain Location: feet, abdomen, back Pain Descriptors / Indicators: Grimacing Pain Intervention(s): Monitored during session     Hand Dominance Right   Extremity/Trunk Assessment Upper Extremity Assessment Upper Extremity Assessment: RUE deficits/detail RUE Deficits / Details: ROM WFL, shoulder flexion 4/5, otherwise 4+/5. Pt lifts heavier things in a supinated position, utilizing bicep and elbow flexors rather than shoulder musculature   Lower Extremity Assessment Lower Extremity Assessment: Defer to PT evaluation   Cervical / Trunk Assessment Cervical / Trunk Assessment: Normal   Communication Communication Communication: No difficulties   Cognition Arousal/Alertness: Awake/alert Behavior During Therapy: WFL for tasks assessed/performed Overall Cognitive Status: Within Functional Limits for tasks assessed                                 General Comments: pt opening up during session about h/o multiple traumas over the course of her life. Tearful at times. Endorses limited social support in this area. OT reached out  to CM after eval for assistance with community mental health resources for pt.   General Comments       Exercises     Shoulder Instructions      Home Living Family/patient expects to be discharged to:: Private residence Living Arrangements: Alone Available Help at Discharge: Family;Available PRN/intermittently Type of Home: House Home Access: Stairs to enter [employer] of Steps: 2 Entrance Stairs-Rails: None Home Layout: One level;Laundry or work area in basement     [employer] Shower/Tub: Teacher, early years/pre: [place]: [employer] - single point          Prior Functioning/Environment Level of Independence: Independent         Comments: pt reports some weakness in RUE which limits her ability to lift, also LLE fatigue over the last month, reports she may have been bitten by a spider        OT Problem List: Decreased activity tolerance;Pain      OT Treatment/Interventions: Self-care/ADL training;Energy conservation;DME and/or AE instruction;Patient/family education;Balance training    OT Goals(Current goals can be found in the care plan section) Acute Rehab OT Goals Patient Stated Goal: process through negative feelings and reduce feelings of anxiety/depression OT Goal Formulation: With patient Time For Goal Achievement: 01/01/21 Potential to Achieve Goals: Good ADL Goals Pt/caregiver will Perform Home Exercise Program: Right Upper extremity;With written HEP provided Additional ADL Goal #1: Pt will indpendently verbalize 2 energy conservation strategies to incorporate into basic ADLs/IADLs.  OT Frequency: Min 2X/week   Barriers to D/C:            Co-evaluation              AM-PAC OT "6 Clicks" Daily Activity     Outcome Measure Help from another person eating meals?: None Help from another person taking care of personal grooming?: None Help from another person toileting, which includes using toliet, bedpan, or urinal?: None Help from another person bathing (including washing, rinsing, drying)?: A Little Help from another person to put on and taking off regular upper body clothing?: None Help from another person to put on and taking off regular lower body clothing?: None 6 Click Score: 23   End of Session Nurse Communication: Mobility status;Other (comment) (encouraged pt to walk 3x day. CM: mental health resources)  Activity Tolerance: Patient tolerated treatment well Patient left: in bed;with call bell/phone within reach  OT Visit Diagnosis: Pain                Time: 0921-0953 OT Time Calculation (min): 32 min Charges:  OT General Charges $OT Visit: 1 Visit OT Evaluation $OT  Eval Low Complexity: 1 Low OT Treatments $Self Care/Home Management : 8-22 mins  Tyrone Schimke, OT Acute Rehabilitation Services Pager: [phone] Office: [phone]   Hortencia Pilar 12/18/2020, 11:11 AM

## 2020-12-18 NOTE — TOC Initial Note (Signed)
Transition of Care [facility]) - Initial/Assessment Note    Patient Details  Name: [name] [name] MRN: [id number] Date of Birth: Dec 18, 1966  [facility] CM/SW Contact:    Curlene Labrum, RN Phone Number: 12/18/2020, 1:34 PM  Clinical Narrative:                 Case management met with the patient regarding transitions of care to home R/T chronic nausea/ vomiting and Right shoulder weakness.  The patient lives at home but plans to receive support from her sister after discharge.  The patient is able to drive and is recommended for outpatient PT.  Outpatient PT referral sent in for [facility] outpatient at [facility].  The patient spoke with me in regards to grief involving the death of her brother and son in the past.  She states that she used to visit a grief counselor but that the counselor was not in the practice and was not helpful in providing ways to cope.  I talked with her and gave her referral to call a counseling group on [address]. In [place], [place], but also patient states that she was planning on visiting a church in [place] area and calling the minister at [employer] - Rev. Sterling Big for counseling at [employer].  No other needs were identified at this time from [facility] team.  Expected Discharge Plan: OP Rehab Barriers to Discharge: Continued Medical Work up   Patient Goals and CMS Choice Patient states their goals for this hospitalization and ongoing recovery are:: Patient plans to discharge home with family support. CMS Medicare.gov Compare Post Acute Care list provided to:: Patient Choice offered to / list presented to : Patient  Expected Discharge Plan and Services Expected Discharge Plan: OP Rehab   Discharge Planning Services: CM Consult,Other - See comment (Referral for Outpatient PT/ counseling for grief/ life stressors)   Living arrangements for the past 2 months: Single Family Home                                      Prior Living  Arrangements/Services Living arrangements for the past 2 months: Single Family Home Lives with:: Self Patient language and need for interpreter reviewed:: Yes Do you feel safe going back to the place where you live?: Yes      Need for Family Participation in Patient Care: Yes (Comment) Care giver support system in place?: Yes (comment)   Criminal Activity/Legal Involvement Pertinent to Current Situation/Hospitalization: No - Comment as needed  Activities of Daily Living      Permission Sought/Granted Permission sought to share information with : Case Manager Permission granted to share information with : Yes, Verbal Permission Granted     Permission granted to share info w AGENCY: Referral for OUtpatient PT for Right shoulder weakness/pain, outpatient counseling  Permission granted to share info w Relationship: family supports     Emotional Assessment Appearance:: Well-Groomed Attitude/Demeanor/Rapport: Engaged Affect (typically observed): Accepting Orientation: : Oriented to Self,Oriented to Place,Oriented to  Time,Oriented to Situation   Psych Involvement: No (comment)  Admission diagnosis:  Renal insufficiency [N28.9] Intractable vomiting [R11.10] Intractable vomiting with nausea, unspecified vomiting type [R11.2] Patient Active Problem List   Diagnosis Date Noted  . Renal insufficiency   . Mood disorder ([place])   . Skin disorder 05/23/2020  . Tubular adenoma of colon 12/03/2019  . Continuous cannabis use 11/08/2019  . Left  ventricular diastolic dysfunction with preserved systolic function 75/79/7282  . Intractable vomiting 08/12/2018  . Primary income source is SS Disability Income 03/10/2017  . Port-A-Cath in place 01/20/2017  . Secondary hyperparathyroidism of renal origin ([place]) 10/05/2016  . Essential hypertension 05/01/2016  . Diabetic gastroparesis associated with type 2 diabetes mellitus ([place]) 05/01/2016  . DM (diabetes mellitus), type 2 with complications ([place])    . Normocytic anemia   . Intermittent constipation 06/06/2015  . Encounter for monitoring opioid maintenance therapy 01/06/2015  . Vitamin D deficiency 11/28/2014  . Pure hypercholesterolemia 08/15/2014  . Chronic kidney disease (CKD) stage G4/A2, severely decreased glomerular filtration rate (GFR) between 15-29 mL/min/1.73 square meter and albuminuria creatinine ratio between 30-299 mg/g (HCC) 08/17/2013  . Allergic rhinitis, seasonal 05/04/2012  . Cyclic vomiting syndrome 08/03/2011  . Neck muscle spasm 01/01/2011  . Chronic bilateral low back pain 04/10/2007  . Anxiety disorder 02/23/2007  . Tobacco abuse 02/23/2007  . Chronic insomnia secondary to General Medical and Mood disorders 02/23/2007  . Diabetic autonomic neuropathy ([place]) 02/23/2007  . GERD with esophagitis 01/27/2004   PCP:  McDiarmid, Blane Ohara, MD Pharmacy:   [facility] [place], [address] [place] [address] Phone: [phone] Fax: [phone]  [id number] - [place], [place] [address] [place] [address] Phone: [phone] Fax: [phone]     Social Determinants of Health (SDOH) Interventions    Readmission Risk Interventions No flowsheet data found.

## 2020-12-18 NOTE — Progress Notes (Signed)
CRITICAL VALUE ALERT  Critical Value:  CBG 43  Date & Time Notied:  12/18/20 0425  Provider Notified: No  Orders Received/Actions taken: Patient is having a snack.  Will recheck glucose.   Glucose came up to 84 at 0600

## 2020-12-18 NOTE — Plan of Care (Signed)
  Problem: Activity: Goal: Risk for activity intolerance will decrease Outcome: Progressing   Problem: Nutrition: Goal: Adequate nutrition will be maintained Outcome: Progressing   

## 2020-12-18 NOTE — Progress Notes (Signed)
Received call from nurse that patient tolerated soft diet today and is happy to go home after eating dinner.  I will put discharge orders in now.  Lattie Haw MD  PGY-2, Family Medicine

## 2020-12-18 NOTE — Progress Notes (Signed)
Patient's R chest PAC reaccessed and heparinized for discharged. Band aid placed on site after deaccessed, no s/s of bleeding noted. Patient is for discharged to home.

## 2020-12-18 NOTE — Progress Notes (Signed)
Pt's significant other is picking pt up. Pt gathered her belongings and was given discharge instruction. Pt walked to the ED and confirmed with significant other via phone that he was almost there. Pt given number for pt experience. MD informed pt discharged.

## 2020-12-18 NOTE — Progress Notes (Signed)
On chart review noted CBG 69 at 0154 and 43 at 0411.  No notification of low glucose.  Noted that CBG recheck at 0554 was 84.  Went to assess patient.  Patient alert and orientated. Asymptomatic.  RN reports that when sugar was 43 patient had no symptoms.  Protein ensure was given at that time.  Patient reports no hypoglycemic events at home.  Continue to monitor Encourage po intake   Carollee Leitz, MD Family Medicine Residency

## 2020-12-18 NOTE — Discharge Summary (Signed)
[facility] Discharge Summary  Patient name: [name] [name] Medical record number: [id number] Date of birth: 1966/04/07 Age: 54 y.o. Gender: female Date of Admission: 12/16/2020  Date of Discharge: 12/18/20 Admitting Physician: Carollee Leitz, MD  Primary Care Provider: McDiarmid, Blane Ohara, MD Consultants: None   Indication for Hospitalization:   Discharge Diagnoses/Problem List:  HTN LV diastolic dysfunction Allergic rhinitis Cyclic vomiting syndrome T2DM with gastroparesis and neuropathy GERD with esophagitis CKD stage 4 Anxiety Cannabis use Tobacco abuse.   Disposition: home   Discharge Condition: Medically stable for discharge  Discharge Exam:  General: pleasant older female, sitting up comfortably in bed, well nourished, well developed, in no acute distress with non-toxic appearance Resp: breathing comfortably on room air, speaking in full sentences Neuro: Alert and oriented, speech normal   Brief Hospital Course:   Cyclic Vomiting, likely 2/2 Marijuana Abuse Diabetic gastroparesis Patient was seen in clinic on 12/21 for intractable vomiting.  Referred to the ED to rule out obstruction.  Imaging not performed in ED due to history of similar symptoms.  She received Phenergan, Haldol, Ativan and Dilaudid.  Lipase within normal limits ruling out pancreatitis.  Diagnosis likely cyclical vomiting secondary to marijuana use.  It is possible that stress/depression/grief may have been contributing to her symptoms and the holidays are difficult for her.  She received fluids IV antiemetics and fluids overnight.  Her medications were restarted on 12/23 and she advanced her diet. She now medically stable for discharge.  Hyperglycemia, Type 2 diabetes Patient had a few asymptomatic hypoglycemic blood sugars which self resolved.   All other medical conditions were chronic and stable.   Issues for Follow Up:  1. Monitor cyclical vomiting  symptoms. 2. Encourage cessation of marijuana use 3. Monitor mood, patient is grieving and struggling with the loss of her son and brother 4. Outpatient PT for right shoulder strengthening  Significant Procedures:   Significant Labs and Imaging:  Recent Labs  Lab 12/16/20 1730 12/18/20 0154  WBC 6.5 9.4  HGB 12.0 10.4*  HCT 36.8 31.5*  PLT 207 192   Recent Labs  Lab 12/16/20 1730 12/18/20 0154  NA 143 143  K 4.5 4.0  CL 109 108  CO2 24 24  GLUCOSE 66* 69*  BUN 26* 31*  CREATININE 2.29* 2.58*  CALCIUM 10.0 9.3  ALKPHOS 87  --   AST 17  --   ALT 12  --   ALBUMIN 4.2  --       Results/Tests Pending at Time of Discharge:   Discharge Medications:  Allergies as of 12/18/2020      Reactions   Bee Venom Anaphylaxis   Required hospital visit to ER   Tegaderm Ag Mesh [silver] Dermatitis   And [place] tape- causes blisters and itching   Acetaminophen Nausea And Vomiting   Novolog [insulin Aspart] Other (See Comments)   Muscles in feet cramp   Statins Other (See Comments)   GI intolerance (N/V)   Flexeril [cyclobenzaprine] Nausea And Vomiting   Lyrica [pregabalin] Other (See Comments)   Leg cramping   Nsaids Nausea And Vomiting   Tape Other (See Comments)   Blisters   Ceftriaxone Other (See Comments)   Caused an ulcer in her mouth   Erythromycin Nausea And Vomiting      Medication List    TAKE these medications   Accu-Chek Aviva Plus test strip Generic drug: glucose blood USE AS DIRECTED   Accu-Chek Softclix Lancets lancets USE AS DIRECTED UP TO  FOUR TIMES DAILY.   B-D UF III MINI PEN NEEDLES 31G X 5 MM Misc Generic drug: Insulin Pen Needle CHECK BLOOD GLUCOSE THREE TIMES DAILY AS DIRECTED   blood glucose meter kit and supplies Kit Dispense based on patient and insurance preference. Use up to four times daily as directed. (FOR ICD-9 250.00, 250.01).   carvedilol 6.25 MG tablet Commonly known as: COREG TAKE 1 TABLET BY MOUTH TWICE DAILY WITH  MEALS   diclofenac Sodium 1 % Gel Commonly known as: VOLTAREN Apply 2 g topically 4 (four) times daily as needed (pain).   empagliflozin 25 MG Tabs tablet Commonly known as: JARDIANCE Take 25 mg by mouth daily.   insulin lispro 100 UNIT/ML KwikPen Commonly known as: HUMALOG Inject 2 units subcutaneous only after eating at least half of your dinner each day. What changed:   how much to take  how to take this  when to take this  additional instructions   Lantus SoloStar 100 UNIT/ML Solostar Pen Generic drug: insulin glargine ADMINISTER 20 UNITS UNDER THE SKIN DAILY What changed: See the new instructions.   LORazepam 1 MG tablet Commonly known as: ATIVAN Take 1 tablet (1 mg total) by mouth every 6 (six) hours as needed for anxiety.   lubiprostone 24 MCG capsule Commonly known as: Amitiza TAKE 1 CAPSULE(24 MCG) BY MOUTH TWICE DAILY WITH A MEAL What changed:   how much to take  how to take this  when to take this  additional instructions   oxyCODONE 5 MG immediate release tablet Commonly known as: Oxy IR/ROXICODONE Take 1 tablet (5 mg total) by mouth every 6 (six) hours as needed for moderate pain or severe pain.   pantoprazole 40 MG tablet Commonly known as: PROTONIX Take 1 tablet (40 mg total) by mouth daily. Start taking on: December 19, 2020   promethazine 25 MG suppository Commonly known as: Phenergan Place 1 suppository (25 mg total) rectally every 6 (six) hours as needed for nausea or vomiting.   QUEtiapine 50 MG tablet Commonly known as: SEROquel Take 1 tablet (50 mg total) by mouth at bedtime.   senna 8.6 MG Tabs tablet Commonly known as: SENOKOT Take 2 tablets (17.2 mg total) by mouth daily as needed for moderate constipation.   Vitamin D (Ergocalciferol) 1.25 MG (50000 UNIT) Caps capsule Commonly known as: DRISDOL Take 50,000 Units by mouth every Friday.       Discharge Instructions: Please refer to Patient Instructions section of EMR for  full details.  Patient was counseled important signs and symptoms that should prompt return to medical care, changes in medications, dietary instructions, activity restrictions, and follow up appointments.   Follow-Up Appointments:  Follow-up Information    [facility] Follow up.   Why: Please call Loree Fee, minister at [employer], to set up counseling for grief at your earliest convenience.       [facility] Follow up.   Specialty: Rehabilitation Why: [facility] will be calling you to set up an appointment for outpatient PT for your right shoulder pain and weakness. Contact information: [address] [phone] [place] [phone]       McDiarmid, Blane Ohara, MD. Schedule an appointment as soon as possible for a visit.   Specialty: Family Medicine Why: Please make an appointment and follow up in the next 7-10 days for a hospital follow up. Contact information: [place] [address] [phone]  Lattie Haw, MD 12/18/2020, 6:05 PM PGY-2, [place]

## 2020-12-18 NOTE — Care Management Obs Status (Signed)
[facility] NOTIFICATION   Patient Details  Name: KEILAH LEMIRE MRN: [id number] Date of Birth: November 30, 1966   Medicare Observation Status Notification Given:  Yes    Curlene Labrum, RN 12/18/2020, 3:51 PM

## 2020-12-18 NOTE — Progress Notes (Signed)
RN was getting ready to discharge pt when pt realized she did not have her white [place] face jacket with her car keys. Pt stated that she came over from the doctors office to the ED via carelink with the jacket. When she got a room in the ED she removed her clothes and put a hospital gown on and pt's things were put in pt belongings bag and she hasn't seen the jacket since then. Pt very upset, crying, and hard to console after finding this out. Pt denies that son or any other relative could have belongings. Pt does not have another set of keys for her car. Pt lives in [place]. RN called ED security and ED to see if they have pt's jacket but they were unable to locate it. AD spoke with pt as well. MD informed of situation and that pt may not discharge tonight. MD coming to speak with pt.

## 2020-12-18 NOTE — Progress Notes (Signed)
Family Medicine Teaching Service Daily Progress Note Intern Pager: [phone]  Patient name: [name] [name] Medical record number: [id number] Date of birth: 12/23/1966 Age: 54 y.o. Gender: female  Primary Care Provider: McDiarmid, Blane Ohara, MD Consultants: None Code Status: Full   Pt Overview and Major Events to Date:  12/22 - patient admitted for cyclic vomiting flare  Assessment and Plan: [name] [name] is a 54 y.o. female presenting with intractable vomiting. PMH is significant for HTN, LV diastolic dysfunction, allergic rhinitis, cyclic vomiting syndrome, T2DM with gastroparesis and neuropathy, GERD with esophagitis, CKD stage 4, anxiety, cannabis use, tobacco abuse.   Cyclic Vomiting, likely 2/2 Marijuana Abuse  Diabetic gastroparesis No episodes of vomiting overnight. Endorses improved abdominal pain, normal bowel movements. Symptoms managed with Morphine 4mg  IV q4h PRN and phenergan 26mg  q6h. Remains hemodynamically stable. CBC/BMP WNL. - stop Morphine - restart home Oxy 5mg , Ativan 1mg , and Phenergan 12.5mg  - advance diet as tolerated - discharge today if tolerates PO - Pain control with Capsaicin cream  - continue to encourage THC cessation - PT recs: Outpatient PT - cont D5NS until tolerating PO then stop - restart home PPI - reassess this afternoon to discuss discharge today vs 12/24  Hypoglycemia, asymptomatic  T2DM:  CBG 43 this AM, improved to 84 this AM. No symptoms at time of low CBG. Home medications include jardiance 25 mg, Humalog 2 units after dinner.  - hold home meds given low blood sugar  - CBG AC and QHS  CKD Stage 4: chronic, stable Creatinine 2.58, GFR 21. Appears baseline is about 2.2-2.5. - Avoid nephrotoxic agents as able  - Monitor with daily BMP  Hematemesis: resolved, likely secondary to intractable vomiting Reported episodes on admission. No further episodes . Remains hemodynamically stable. Hgb at 10.4, baseline of 9-10.   -Phenergan 12.5mg   PRN -Continue to monitor -CBC in am  HTN: chronic, stable BP improving, 150/72 this AM. Home meds: carvedilol 6.25 mg.  - Vitals per floor protocol -Continue Carvedilol 6.25 mg, if unable to tolerate po can switch to Metoprolol IV   Anxiety Disorder  Mood disorder: chronic, stable Home medications include Seroquel 50 mg nightly, Ativan 1 mg q6h PRN anxiety. - Continue home Seroquel.  - plan to restart home PRN Ativan   Marijuana Abuse  Tobacco Abuse  Likely contributing to cyclic vomiting.  - Nicotine patch PRN - Encourage cessation  FEN/GI: Clears Prophylaxis: Lovenox   Disposition: possible d/c 12/23 if able to tolerate PO, or 12/24  Subjective:  Patient notes she is feeling much better. She is ready to advance her diet.   Objective: Temp:  [98.4 F (36.9 C)-99.4 F (37.4 C)] 98.5 F (36.9 C) (12/23 0742) Pulse Rate:  [67-102] 85 (12/23 0742) Resp:  [15-21] 17 (12/23 0742) BP: (132-185)/(63-96) 150/72 (12/23 0742) SpO2:  [99 %-100 %] 100 % (12/23 0742) Physical Exam: General: pleasant older female, sitting up comfortably in bed, well nourished, well developed, in no acute distress with non-toxic appearance Resp: breathing comfortably on room air, speaking in full sentences Neuro: Alert and oriented, speech normal  Laboratory: Recent Labs  Lab 12/16/20 1730 12/18/20 0154  WBC 6.5 9.4  HGB 12.0 10.4*  HCT 36.8 31.5*  PLT 207 192   Recent Labs  Lab 12/16/20 1730 12/18/20 0154  NA 143 143  K 4.5 4.0  CL 109 108  CO2 24 24  BUN 26* 31*  CREATININE 2.29* 2.58*  CALCIUM 10.0 9.3  PROT 7.3  --   BILITOT  0.8  --   ALKPHOS 87  --   ALT 12  --   AST 17  --   GLUCOSE 66* 69*    Imaging/Diagnostic Tests: No results found.  [name] Marble Keyes, DO 12/18/2020, 8:56 AM PGY-3, [place] Intern pager: [phone], text pages welcome

## 2020-12-22 ENCOUNTER — Ambulatory Visit (INDEPENDENT_AMBULATORY_CARE_PROVIDER_SITE_OTHER): Payer: 59 | Admitting: Family Medicine

## 2020-12-22 ENCOUNTER — Other Ambulatory Visit: Payer: Self-pay

## 2020-12-22 ENCOUNTER — Encounter: Payer: Self-pay | Admitting: Family Medicine

## 2020-12-22 VITALS — BP 160/82 | HR 71 | Ht 62.0 in | Wt 152.4 lb

## 2020-12-22 DIAGNOSIS — L989 Disorder of the skin and subcutaneous tissue, unspecified: Secondary | ICD-10-CM

## 2020-12-22 DIAGNOSIS — E11311 Type 2 diabetes mellitus with unspecified diabetic retinopathy with macular edema: Secondary | ICD-10-CM | POA: Diagnosis not present

## 2020-12-22 DIAGNOSIS — E118 Type 2 diabetes mellitus with unspecified complications: Secondary | ICD-10-CM | POA: Diagnosis not present

## 2020-12-22 DIAGNOSIS — R1115 Cyclical vomiting syndrome unrelated to migraine: Secondary | ICD-10-CM | POA: Diagnosis not present

## 2020-12-22 DIAGNOSIS — E1143 Type 2 diabetes mellitus with diabetic autonomic (poly)neuropathy: Secondary | ICD-10-CM | POA: Diagnosis not present

## 2020-12-22 DIAGNOSIS — K3184 Gastroparesis: Secondary | ICD-10-CM

## 2020-12-22 LAB — POCT GLYCOSYLATED HEMOGLOBIN (HGB A1C): Hemoglobin A1C: 7.4 % — AB (ref 4.0–5.6)

## 2020-12-23 DIAGNOSIS — E11319 Type 2 diabetes mellitus with unspecified diabetic retinopathy without macular edema: Secondary | ICD-10-CM

## 2020-12-23 DIAGNOSIS — E11311 Type 2 diabetes mellitus with unspecified diabetic retinopathy with macular edema: Secondary | ICD-10-CM | POA: Insufficient documentation

## 2020-12-23 HISTORY — DX: Type 2 diabetes mellitus with unspecified diabetic retinopathy without macular edema: E11.319

## 2020-12-23 NOTE — Assessment & Plan Note (Signed)
Established problem that has improved.  Only one open skin excoriation, now over her right superior scapular region.  ~ 1 cm diameter with dry red base, areas of scarring in same area.  No surrounding erythema.  No increased warmth. Not TTP/. Encouraged [name] [name] to continue the Seroquel 50 mg daily dor now.  I would recommend 3 to 4 month stability before a repeat trial of the atypical antipsychotic.

## 2020-12-23 NOTE — Progress Notes (Signed)
[name] [name] is alone Sources of clinical information for visit is/are patient and past medical records. Nursing assessment for this office visit was reviewed with the patient for accuracy and revision.     Previous Report(s) Reviewed: ER records and 12/  Depression screen [facility] 2/9 08/28/2020  Decreased Interest 0  Down, Depressed, Hopeless 0  PHQ - 2 Score 0  Altered sleeping 0  Tired, decreased energy 1  Change in appetite 1  Feeling bad or failure about yourself  0  Trouble concentrating 0  Moving slowly or fidgety/restless 1  Suicidal thoughts 0  PHQ-9 Score 3  Difficult doing work/chores -  Some recent data might be hidden    Fall Risk  06/05/2020 11/16/2018 07/28/2018 01/05/2018 12/09/2017  Falls in the past year? 1 0 No No No  Number falls in past yr: 0 0 - - -  Injury with Fall? 1 0 - - -  Risk for fall due to : - - - - -  Risk for fall due to: Comment - - - - -    PHQ9 SCORE ONLY 08/28/2020 07/10/2020 06/05/2020  PHQ-9 Total Score 3 2 0    Adult vaccines due  Topic Date Due  . TETANUS/TDAP  09/29/2023  . PNEUMOCOCCAL POLYSACCHARIDE VACCINE AGE 69-64 HIGH RISK  Completed    Health Maintenance Due  Topic Date Due  . Hepatitis C Screening  Never done  . COVID-19 Vaccine (3 - Pfizer risk 4-dose series) 06/07/2020      History/P.E. limitations: none  Adult vaccines due  Topic Date Due  . TETANUS/TDAP  09/29/2023  . PNEUMOCOCCAL POLYSACCHARIDE VACCINE AGE 69-64 HIGH RISK  Completed    Diabetes Health Maintenance Due  Topic Date Due  . HEMOGLOBIN A1C  06/22/2021  . FOOT EXAM  07/10/2021  . OPHTHALMOLOGY EXAM  12/08/2021    Health Maintenance Due  Topic Date Due  . Hepatitis C Screening  Never done  . COVID-19 Vaccine (3 - Pfizer risk 4-dose series) 06/07/2020     Chief Complaint  Patient presents with  . Rash    Follow up outpatient visit after Hospitalization  The Discharge Summary for the hospitalization from 12/16/20 to 12/18/20 was reviewed.   Nursing assessment for this office visit was reviewed with the patient for accuracy and revision.   HPI Principle Diagnosis requiring hospitalization: Acute exacerbation of Cyclic Vomiting Syndrome  Brief Hospital course summary:  Cyclic Vomiting &Diabetic gastroparesis Patient was seen in clinic on 12/21 for intractable vomiting.  Referred to the ED to rule out obstruction.  Imaging not performed in ED due to history of similar symptoms.  She received Phenergan, Haldol, Ativan and Dilaudid.  Lipase within normal limits ruling out pancreatitis.  Diagnosis likely cyclical vomiting secondary to marijuana use.  It is possible that stress/depression/grief may have been contributing to her symptoms and the holidays are difficult for her.  She received fluids IV antiemetics and fluids overnight.  Her medications were restarted on 12/23 and she advanced her diet. She now medically stable for discharge.  Hyperglycemia, Type 2 diabetes Patient had a few asymptomatic hypoglycemic blood sugars which self resolved Follow up instructions from patient's hospital healthcare providers:   ---------------------------------------------------------------------------------------------------------------------- Problems since hospital discharge: None, other than stress from losing her jacket with cash and her car keys from ED per patient Mild nausea currently, no vomiting, able to keep down liquids and food since DC  ---------------------------------------------------------------------------------------------------------------------- ---------------------------------------------------------------------------------------------------------------------- New medications started during hospitalization: none Chronic medications stopped during hospitalization: none  Patient's Medication List was updated in the EMR:  yes --------------------------------------------------------------------------------------------------------------------- [place]: none Durable Medical Equipment: none  CPT E&M Office Visit Time Before Visit; reviewing medical records (e.g. recent visits, labs, studies): 8 minutes During Visit (F2F time): 15 minutes After Visit (discussion with family or HCP, prescribing, ordering, referring, calling result/recommendations or documenting on same day): 9 minutes Total Visit Time: 34 minutes

## 2020-12-23 NOTE — Addendum Note (Signed)
Addended byWendy Poet, Layonna Dobie D on: 12/23/2020 11:26 AM   Modules accepted: Orders

## 2020-12-23 NOTE — Assessment & Plan Note (Signed)
Lab Results  Component Value Date   HGBA1C 7.4 (A) 12/22/2020   HGBA1C 8.3 (A) 08/28/2020   HGBA1C 10.4 (A) 05/22/2020   Established problem that has improved.  Patient is no longer on Empagliflozin Plan:Continue Lantus 20 unit qam. Re-start Humalog 2 units with meals Could add DPP4i, Linagliptin 5 mg dialy

## 2020-12-23 NOTE — Assessment & Plan Note (Signed)
New problem 12/08/20 Optometry eye assessment found right eye with non-proliferative diabetic retinopathy with macular edema.  Referral sent to [facility] for ophthalmology evaluation.

## 2020-12-23 NOTE — Assessment & Plan Note (Addendum)
Established problem that has improved.  Continue current abortive prn medication regiment, including lorazepam, phenergan, and oxycodone.

## 2020-12-23 NOTE — Assessment & Plan Note (Signed)
Established problem that has improved.  Continue current prn abortive medication regiment, lorazepam, phenergan, and oxycodone.

## 2020-12-23 NOTE — Assessment & Plan Note (Signed)
Established problem. Stable.  No signs of complications, medication side effects, or red flags. Continue current medications and other regiments.  

## 2020-12-25 ENCOUNTER — Ambulatory Visit (HOSPITAL_COMMUNITY): Payer: 59 | Admitting: Physical Therapy

## 2021-01-01 ENCOUNTER — Ambulatory Visit (HOSPITAL_COMMUNITY): Payer: 59 | Attending: Family Medicine | Admitting: Physical Therapy

## 2021-01-01 ENCOUNTER — Other Ambulatory Visit: Payer: Self-pay

## 2021-01-01 ENCOUNTER — Encounter (HOSPITAL_COMMUNITY): Payer: Self-pay | Admitting: Physical Therapy

## 2021-01-01 DIAGNOSIS — M25511 Pain in right shoulder: Secondary | ICD-10-CM | POA: Diagnosis present

## 2021-01-01 DIAGNOSIS — M25611 Stiffness of right shoulder, not elsewhere classified: Secondary | ICD-10-CM | POA: Diagnosis present

## 2021-01-01 NOTE — Therapy (Signed)
[place], [place], [address] Phone: [phone]   Fax:  [phone]  Physical Therapy Evaluation  Patient Details  Name: [name] [name] MRN: [id number] Date of Birth: 12-08-1966 Referring Provider (PT): Madison Hickman MD   Encounter Date: 01/01/2021   PT End of Session - 01/01/21 1115    Visit Number 1    Number of Visits 8    Date for PT Re-Evaluation 01/30/21    Authorization Type UHC/ UHC Medicaid advantage  2nd    Authorization - Visit Number 1    Authorization - Number of Visits 36    PT Start Time 2482    PT Stop Time 1115    PT Time Calculation (min) 40 min    Activity Tolerance Patient tolerated treatment well    Behavior During Therapy [facility] for tasks assessed/performed           Past Medical History:  Diagnosis Date  . Abdominal pain, generalized   . Acquired bilateral hammer toes 01/20/2017  . Acute post-traumatic stress disorder 11/17/2018  . AKI (acute kidney injury) ([place])   . Allergy   . Anemia   . Anxiety   . Arthritis   . At risk for polypharmacy 08/15/2014  . Biceps tendinopathy of right upper extremity 04/05/2014  . Blood transfusion 2003  . Cataract    both eyes  . Chronic generalized abdominal pain   . Chronic kidney disease (CKD), stage III (moderate) (HCC)   . Chronic leg cramping 07/18/2014  . CKD (chronic kidney disease) 08/17/2013   Seen by Dr Joelyn Oms 07/28/15 ([place]) - (see scanned document): 20 years diabetes, 15 years HTN, stable CKD stage III with proteinuria.  - CKD, discussed NSAID avoidance, diabetes control with target A1c<7.5%, and HTN control with SBP<140. F/u 6 months and repeat renal panel at that time. - Microalbuminuria: On lisinopril 40mg , suspected secondary due to diabetic nephropathy. - Chronic back pain: No NSDAIDs. - Say amlodipine causes nausea  - Advised not to ACE-i during vomiting episodes    . Complication of anesthesia    "problems waking up"   . Cyclic vomiting syndrome   . Delayed gastric emptying 05/12/2011    04/12/11 NUCLEAR MEDICINE GASTRIC EMPTYING SCAN Radiopharmaceutical: 2.0 mCi Tc-47m sulfur colloid. Comparison: None. Findings: At 1 hour, 81% of the counts remain in the stomach. At 2 hours, 53% remain. Normal is less than 30% at 2 hours. IMPRESSION: Moderate delay in gastric emptying.      . Depression   . Depression with anxiety 02/23/2007   See [facility] Note dated 05/06/2016.  IC Gwenlyn Saran) was called in and I did a brief assessment.   . Diabetic gastroparesis ([place])    /e-chart  . Diabetic ketoacidosis without coma associated with type 2 diabetes mellitus ([place])   . DKA (diabetic ketoacidoses) 08/03/2016  . Elevated cholesterol 11/28/2015  . Emotional stress 12/30/2015   Stress after Breakup with partnes of 13 years on 12/27/2015   . Encounter for chronic pain management 01/06/2015   Indication for chronic opioid: Chronic back pain, cyclic vomiting Medication and dose: oxycodone 5mg   # pills per month: 15 Last UDS date: 05/08/2015 - result pending Pain contract signed (Y/N): Yes, reviewed 01/06/2015  Date narcotic database last reviewed (include red flags): 05/08/2015 (no red flags).    . Erosive esophagitis 08/06/2019  . Esophagitis 04/18/2013   Documented by Dr. Henrene Pastor EGD 03/2011  He also consulted on her during hospitalization 02/2013 for  cyclical vomiting. No repeat EGD necessary, treat esophagitis with PPI.    Marland Kitchen Esophagitis determined by endoscopy 04/18/2013   Documented by Dr. Henrene Pastor EGD 03/2011  He also consulted on her during hospitalization 63/3354 for cyclical vomiting. No repeat EGD necessary, treat esophagitis with PPI.    Marland Kitchen Essential hypertension, malignant 02/23/2007   Jan 2015 - stopped recently-started norvasc due to reports of GI symptoms. 02/2015 - holding lisinopril, adding hydralazine 10mg  TID, and will take 1/2 tab lisinopril when feeling better when not in cyclic vomiting flare   . GERD (gastroesophageal  reflux disease)   . Grief reaction 10/16/2018  . Heart murmur   . Hematuria 07/09/2015  . High risk social situation 08/31/2012  . Hip pain, bilateral 03/31/2018  . History of colonic polyps 11/15/2018   10/2017 screening colonoscopy Dr Scarlette Shorts: Seven polyps were found in the sigmoid colon, transverse colon, ascending colon and cecum. The polyps were 2 to 10 mm in size. These polyps were removed with a cold snare.   Marland Kitchen History of multiple colonic tubular adenomas 11/15/2018   10/2019 Surveillance colonoscopy Dr Scarlette Shorts found five (1-3 mm) polyps that were removed by cold snares (polypectomies). Polyps in ascending colon and cecum. Histopathology:TUBULAR ADENOMA, NEGATIVE FOR HIGH GRADE DYSPLASIA (4).  Recommendation repeat surv. c-scopy in 3 yrs.  10/2017 screening colonoscopy Dr Scarlette Shorts: Seven tubular adenomas were found in the sigmoid colon, transverse colon, a  . Hot flashes 07/29/2011  . Hypertension   . Incontinence 10/03/2017  . INSOMNIA NOS 02/23/2007   Qualifier: Diagnosis of  By: Beryle Lathe    . Intermittent constipation 06/06/2015  . Intestinal impaction ([place])   . Intractable vomiting 07/02/2016  . Marijuana smoker, continuous    pt. denies does smoke marijuana  . MIGRAINE, UNSPEC., W/O INTRACTABLE MIGRAINE 02/23/2007   Qualifier: Diagnosis of  By: Beryle Lathe    . Myocardial infarction [facility]) 07/2007   "they say I've had a silent one; I don't know"  . Nausea and vomiting in adult 06/06/2013  . Neuromuscular disorder (HCC)    neuropathy in both legs  . NEUROPATHY, DIABETIC 02/23/2007  . Nonspecific low back pain 04/10/2007   Back pain is chronic. Oxycodone per prior notes helps pt be more active. Reportedly uses 4 tab/week oxy for intermittent worsening of chronic back pain but mostly for cyclic vom to keep her out of hospital.    . Normocytic anemia   . Onychomycosis 03/25/2015  . PANCREATITIS 05/09/2008  . Pneumonia   . Pre-ulcerative calluses 05/27/2017  . Primary income  source is SS Disability Income 03/10/2017   Disability SSI  . Protein-calorie malnutrition, severe ([place]) 12/17/2013  . Pure hypercholesterolemia 08/15/2014   Based on her choleserol, BP, smoker status, and that she is a diabetic, her 10 year ASCVD risk is 9.9% putting her in a category of risk that would benefit from high-intensity statin.    Marland Kitchen Pyelonephritis 06/09/2015   stranding about kidney on CT AP   . Recurrent major depressive disorder ([place])   . Rotator cuff syndrome 04/19/2014   rt. shoulder  . Rotator cuff tendonopathies with partial tear, right 03/31/2018   05/23/18 MRI: 1. Severe tendonosis of the supraspinatus tendon with a partial-thickness bursal surface tear.     2. Mild tendinosis of the infraspinatus tendon.     3. Mild tendinosis of the subscapularis tendon  . Smoker   . Stye external 09/02/2017  . TOBACCO DEPENDENCE 02/23/2007   2 cigarettes every few days 11/2012  3 cigarettes daily    . Type II diabetes mellitus ([place])   . Uncontrolled secondary diabetes mellitus with stage 3 CKD (GFR 30-59) (HCC) 10/23/2011   Nephropathy, gastroparesis  HgbA1c (06/2011) 7.6, 9.5, 12.2, 8.2, 10.9 (07/2012), 9.2 (02/11/2013), 7.7 (09/27/13)  Metformin: she has a history of cyclical vomiting; she would prefer not to take this medication  On lanuts 20U qam with improved control. 09/27/13   . Vitamin D deficiency 11/28/2014    Past Surgical History:  Procedure Laterality Date  . ABDOMINAL HYSTERECTOMY  02/2002  . CARDIAC CATHETERIZATION    . CATARACT EXTRACTION W/ INTRAOCULAR LENS  IMPLANT, BILATERAL    . CESAREAN SECTION     x 3  . CHOLECYSTECTOMY  1980's  . COLONOSCOPY     patient not sure  . DILATION AND EVACUATION  X3  . laparotomy and lysis of adhesions    . left hand surgery    . PORT-A-CATH placement  ~ 2008   right chest; "poor access; frequent hospitalizations"  . SHOULDER ARTHROSCOPY Right 07/21/2019   [facility] ([facility]): Arthroscopic rotator cuff repair; Arthroscopic distal  clavicle resection; Arthroscopic subacromial decompression and bursectomy;  Debridement of degenerative labral tear  . SMALL INTESTINE SURGERY     laporotomy with lysis of adhesions.  Marland Kitchen UPPER GI ENDOSCOPY      There were no vitals filed for this visit.    Subjective Assessment - 01/01/21 1040    Subjective Patient presents to physical therapy with complaint of RT shoulder pain. Patient says she had a rotator cuff repair around 07/02/20. She says she underwent therapy at another facility. She says they didn't stretch her arm as much as she would like so she did not return. She states her ROM is better now, but feels some tightness when she reaches across her body. She also notes "pulling" in her shoulder blade area. She says she also feels weak and cannot lift heavy items with RT arm. States she is stalking oxycodone for pain, but this is mostly for her back.    Pertinent History RT RCR around 07/02/20    Limitations House hold activities;Lifting    Patient Stated Goals Strengthen my arm    Currently in Pain? Yes    Pain Score 6     Pain Location Shoulder    Pain Orientation Right;Posterior    Pain Descriptors / Indicators Aching;Dull    Pain Type Acute pain    Pain Onset More than a month ago    Pain Frequency Constant    Aggravating Factors  lifting, reaching overhead    Pain Relieving Factors meds    Effect of Pain on Daily Activities Limits              [facility] PT Assessment - 01/01/21 0001      Assessment   Medical Diagnosis RT shoulder pain    Referring Provider (PT) Madison Hickman MD    Onset Date/Surgical Date 07/02/20    Prior Therapy Yes      Precautions   Precautions None      Restrictions   Weight Bearing Restrictions No      [place] residence      Prior Function   Level of Independence Independent      Cognition   Overall Cognitive Status Within Functional Limits for tasks assessed      Observation/Other Assessments    Focus on Therapeutic Outcomes (FOTO)  64% function  Posture/Postural Control   Posture/Postural Control Postural limitations    Postural Limitations Rounded Shoulders      ROM / Strength   AROM / PROM / Strength AROM;Strength      AROM   AROM Assessment Site Shoulder    Right/Left Shoulder Right;Left    Right Shoulder Flexion 95 Degrees    Right Shoulder ABduction --   WNL   Right Shoulder Internal Rotation --   T8   Right Shoulder External Rotation --   T1   Left Shoulder Flexion --   WNL   Left Shoulder ABduction --   WNL   Left Shoulder Internal Rotation --   T4   Left Shoulder External Rotation --   T1     Strength   Strength Assessment Site Shoulder    Right/Left Shoulder Right;Left    Right Shoulder Flexion 4/5    Right Shoulder ABduction 4/5    Right Shoulder Internal Rotation 4+/5    Right Shoulder External Rotation 4/5    Left Shoulder Flexion 4+/5    Left Shoulder ABduction 4+/5    Left Shoulder Internal Rotation 5/5    Left Shoulder External Rotation 4+/5      Palpation   Palpation comment No TTP noted about RT shoulder/ deltoid                      Objective measurements completed on examination: See above findings.       [place] Adult PT Treatment/Exercise - 01/01/21 0001      Exercises   Exercises Shoulder      Shoulder Exercises: Supine   Other Supine Exercises AAROM shoulder flexion x10, sidelying RT shoulder ER x10                  PT Education - 01/01/21 1043    Education Details on evaluation findings, POC and HEP    Person(s) Educated Patient    Methods Explanation;Handout    Comprehension Verbalized understanding            PT Short Term Goals - 01/01/21 1134      PT SHORT TERM GOAL #1   Title Patient will be independent with initial HEP and self-management strategies to improve functional outcomes    Time 2    Period Weeks    Status New    Target Date 01/16/21             PT Long Term Goals -  01/01/21 1134      PT LONG TERM GOAL #1   Title Patient will improve FOTO score by 10 to indicate improvement in functional outcomes    Time 4    Period Weeks    Status New    Target Date 01/30/21      PT LONG TERM GOAL #2   Title Patient will improve RT shoulder flexion AROM to at least 140 degrees for improved ability to perform UE ADLs.    Time 4    Period Weeks    Status New    Target Date 01/30/21      PT LONG TERM GOAL #3   Title Patient will improve RT shoulder MMT to 4+/5 throughout for improved ability to perform UE ADLs.    Time 4    Period Weeks    Status New    Target Date 01/30/21                  Plan - 01/01/21 1131  Clinical Impression Statement Patient is a 55 y.o. female who presents to physical therapy with complaint of RT shoulder pain. Patient demonstrates decreased strength, ROM restriction, and postural abnormalities which are likely contributing to symptoms of pain and are negatively impacting patient ability to perform ADLs. Patient will benefit from skilled physical therapy services to address these deficits to reduce pain and improve level of function with ADLs    Examination-Activity Limitations Lift;Other;Carry    Examination-Participation Restrictions Community Activity;Cleaning;Laundry    Stability/Clinical Decision Making Stable/Uncomplicated    Clinical Decision Making Low    Rehab Potential Good    PT Frequency 2x / week    PT Duration 4 weeks    PT Treatment/Interventions ADLs/Self Care Home Management;Aquatic Therapy;Biofeedback;Cryotherapy;Electrical Stimulation;Iontophoresis 4mg /ml Dexamethasone;Moist Heat;Traction;Therapeutic exercise;Manual techniques;Therapeutic activities;Vasopneumatic Device;Splinting;Taping;Energy conservation;Dry needling;Joint Manipulations;Spinal Manipulations;Passive range of motion;Scar mobilization;Compression bandaging;Neuromuscular re-education;Ultrasound;Parrafin;Fluidtherapy;Contrast Bath;Patient/family  education;DME Instruction;Orthotic Fit/Training    PT Next Visit Plan Review goals. Progress RT shoulder and scapular strength as tolerated, work on pain free shoulder mobility also. Add band exercises when ready, sidelying shoulder flexion, abduction. Try pulleys and UBE as tolerated. Manuals for pain and mobility as needed.    PT Home Exercise Plan Eval: AAROM shoulder flexion, sidelying ER    Consulted and Agree with Plan of Care Patient           Patient will benefit from skilled therapeutic intervention in order to improve the following deficits and impairments:  Decreased range of motion,Impaired UE functional use,Pain,Decreased strength  Visit Diagnosis: Right shoulder pain, unspecified chronicity  Stiffness of right shoulder, not elsewhere classified     Problem List Patient Active Problem List   Diagnosis Date Noted  . Diabetic retinopathy, non-proliferative, of right eye with macular edema associated with type 2 diabetes mellitus ([place]) 12/23/2020  . Mood disorder ([place])   . Skin disorder 05/23/2020  . Tubular adenoma of colon 12/03/2019  . Continuous cannabis use 11/08/2019  . Left ventricular diastolic dysfunction with preserved systolic function 24/23/5361  . Primary income source is SS Disability Income 03/10/2017  . Port-A-Cath in place 01/20/2017  . Secondary hyperparathyroidism of renal origin ([place]) 10/05/2016  . Essential hypertension 05/01/2016  . Diabetic gastroparesis associated with type 2 diabetes mellitus ([place]) 05/01/2016  . DM (diabetes mellitus), type 2 with complications ([place])   . Normocytic anemia   . Intermittent constipation 06/06/2015  . Encounter for monitoring opioid maintenance therapy 01/06/2015  . Vitamin D deficiency 11/28/2014  . Pure hypercholesterolemia 08/15/2014  . Chronic kidney disease (CKD) stage G4/A2, severely decreased glomerular filtration rate (GFR) between 15-29 mL/min/1.73 square meter and albuminuria creatinine ratio between  30-299 mg/g (HCC) 08/17/2013  . Allergic rhinitis, seasonal 05/04/2012  . Cyclic vomiting syndrome 08/03/2011  . Chronic bilateral low back pain 04/10/2007  . Anxiety disorder 02/23/2007  . Tobacco abuse 02/23/2007  . Chronic insomnia secondary to General Medical and Mood disorders 02/23/2007  . Diabetic autonomic neuropathy ([place]) 02/23/2007  . GERD with esophagitis 01/27/2004    11:39 AM, 01/01/21 Josue Hector PT DPT  Physical Therapist with [facility]  (336) [phone] [place] [address] [place], [place], [address] Phone: [phone]   Fax:  [phone]  Name: [name] [name] MRN: [id number] Date of Birth: 04/14/1966

## 2021-01-01 NOTE — Patient Instructions (Signed)
Access Code: [email or web address] URL: [email or web address] Date: 01/01/2021 Prepared by: Josue Hector  Exercises Sidelying Shoulder External Rotation - 3 x daily - 7 x weekly - 2 sets - 10 reps Supine Shoulder Flexion AAROM - 3 x daily - 7 x weekly - 2 sets - 10 reps

## 2021-01-06 DIAGNOSIS — E113213 Type 2 diabetes mellitus with mild nonproliferative diabetic retinopathy with macular edema, bilateral: Secondary | ICD-10-CM | POA: Insufficient documentation

## 2021-01-06 DIAGNOSIS — E11311 Type 2 diabetes mellitus with unspecified diabetic retinopathy with macular edema: Secondary | ICD-10-CM | POA: Insufficient documentation

## 2021-01-06 LAB — HM DIABETES EYE EXAM

## 2021-01-09 ENCOUNTER — Other Ambulatory Visit: Payer: Self-pay | Admitting: Family Medicine

## 2021-01-09 DIAGNOSIS — I10 Essential (primary) hypertension: Secondary | ICD-10-CM

## 2021-01-14 ENCOUNTER — Other Ambulatory Visit: Payer: Self-pay

## 2021-01-14 DIAGNOSIS — R1115 Cyclical vomiting syndrome unrelated to migraine: Secondary | ICD-10-CM

## 2021-01-14 NOTE — Telephone Encounter (Signed)
Patient calls nurse line regarding prescription refills and vomiting x 2 days. Patient reports headache, diaphoresis, abdominal and back pain, and is unable to keep down fluids. Patient is unable to quantify temperature as she does not have access to thermometer, but believes that she is running fever. Advised UC/ ED evaluation due to current symptoms. Patient verbalizes understanding.   Will send refill requests to Dr. McDiarmid.   Talbot Grumbling, RN

## 2021-01-16 MED ORDER — OXYCODONE HCL 5 MG PO TABS: 5.0000 mg | ORAL_TABLET | Freq: Four times a day (QID) | ORAL | 0 refills | Status: DC | PRN

## 2021-01-16 MED ORDER — LORAZEPAM 1 MG PO TABS: 1.0000 mg | ORAL_TABLET | Freq: Four times a day (QID) | ORAL | 0 refills | Status: DC | PRN

## 2021-01-20 ENCOUNTER — Encounter: Payer: Self-pay | Admitting: Family Medicine

## 2021-01-21 ENCOUNTER — Encounter: Payer: Self-pay | Admitting: Family Medicine

## 2021-01-21 NOTE — Patient Instructions (Addendum)
[place] -   We are checking your Vitamin D level to make sure it is enough to keep your bones strong.  After we have your Vitamin D levels back, we can figure out what is the right kind of Vitamin D and how much you should take.   Keep taking the Seroquel as you are.  I would like to keep taking it until around April.  We can talk then whether or not you wish to continue taking it.   You are starting a new diabetes medication called Ozempic.  You inject it once a week.   If you are having too low blood sugars after starting the Ozempic, decrease your Lantus by at least 4 units and decrease your Humalog to 1 unit each meal.   Start taking tizanidine, one tablet every 8 hours, if you need it for back pain.   Consider purchasing Salonpas lidocaine patches to apply to the painful area of your low back.

## 2021-01-22 ENCOUNTER — Other Ambulatory Visit: Payer: Self-pay

## 2021-01-22 ENCOUNTER — Ambulatory Visit (INDEPENDENT_AMBULATORY_CARE_PROVIDER_SITE_OTHER): Payer: 59 | Admitting: Family Medicine

## 2021-01-22 ENCOUNTER — Encounter: Payer: Self-pay | Admitting: Family Medicine

## 2021-01-22 VITALS — BP 150/90 | HR 74 | Ht 62.0 in | Wt 155.0 lb

## 2021-01-22 DIAGNOSIS — E1169 Type 2 diabetes mellitus with other specified complication: Secondary | ICD-10-CM

## 2021-01-22 DIAGNOSIS — E118 Type 2 diabetes mellitus with unspecified complications: Secondary | ICD-10-CM

## 2021-01-22 DIAGNOSIS — E11311 Type 2 diabetes mellitus with unspecified diabetic retinopathy with macular edema: Secondary | ICD-10-CM

## 2021-01-22 DIAGNOSIS — E1143 Type 2 diabetes mellitus with diabetic autonomic (poly)neuropathy: Secondary | ICD-10-CM | POA: Diagnosis not present

## 2021-01-22 DIAGNOSIS — R1115 Cyclical vomiting syndrome unrelated to migraine: Secondary | ICD-10-CM

## 2021-01-22 DIAGNOSIS — Z1159 Encounter for screening for other viral diseases: Secondary | ICD-10-CM

## 2021-01-22 DIAGNOSIS — M545 Low back pain, unspecified: Secondary | ICD-10-CM

## 2021-01-22 DIAGNOSIS — E1159 Type 2 diabetes mellitus with other circulatory complications: Secondary | ICD-10-CM | POA: Diagnosis not present

## 2021-01-22 DIAGNOSIS — K3184 Gastroparesis: Secondary | ICD-10-CM

## 2021-01-22 DIAGNOSIS — E559 Vitamin D deficiency, unspecified: Secondary | ICD-10-CM

## 2021-01-22 DIAGNOSIS — I152 Hypertension secondary to endocrine disorders: Secondary | ICD-10-CM

## 2021-01-22 DIAGNOSIS — G8929 Other chronic pain: Secondary | ICD-10-CM

## 2021-01-22 DIAGNOSIS — E785 Hyperlipidemia, unspecified: Secondary | ICD-10-CM

## 2021-01-22 MED ORDER — OZEMPIC (0.25 OR 0.5 MG/DOSE) 2 MG/1.5ML ~~LOC~~ SOPN: 0.2500 mg | PEN_INJECTOR | SUBCUTANEOUS | 0 refills | Status: DC

## 2021-01-22 MED ORDER — ATORVASTATIN CALCIUM 40 MG PO TABS: 40.0000 mg | ORAL_TABLET | ORAL | 99 refills | Status: DC

## 2021-01-22 MED ORDER — TIZANIDINE HCL 2 MG PO CAPS
2.0000 mg | ORAL_CAPSULE | Freq: Three times a day (TID) | ORAL | 0 refills | Status: DC
Start: 2021-01-22 — End: 2021-04-23

## 2021-01-22 NOTE — Progress Notes (Signed)
Patient was educated on the use of Ozempic (semaglutide) pen. Reviewed necessary supplies and operation of the pen. Patient was able to demonstrate use and first dose was administered in clinic today. All questions and concerns were addressed.  Medication Samples have been provided to the patient.  Drug name: Ozempic (semaglutide)      Strength: 0.25 mg once weekly         Qty: 1  LOT: [id number]  Exp.Date: 09/26/23  Dosing instructions: Inject 3.33 mg minus 4 clicks once weekly.  The patient has been instructed regarding the correct time, dose, and frequency of taking this medication, including desired effects and most common side effects.   Lorel Monaco, PharmD, [place] PGY2 Ambulatory Care Resident [place]

## 2021-01-22 NOTE — Progress Notes (Addendum)
[name] [name] is alone Sources of clinical information for visit is/are patient and past medical records. Nursing assessment for this office visit was reviewed with the patient for accuracy and revision.     Previous Report(s) Reviewed: historical medical records and labs Depression screen [facility] 2/9 01/22/2021  Decreased Interest 1  Down, Depressed, Hopeless 1  PHQ - 2 Score 2  Altered sleeping 1  Tired, decreased energy 1  Change in appetite 1  Feeling bad or failure about yourself  1  Trouble concentrating 1  Moving slowly or fidgety/restless 0  Suicidal thoughts 0  PHQ-9 Score 7  Difficult doing work/chores -  Some recent data might be hidden    Fall Risk  06/05/2020 11/16/2018 07/28/2018 01/05/2018 12/09/2017  Falls in the past year? 1 0 No No No  Number falls in past yr: 0 0 - - -  Injury with Fall? 1 0 - - -  Risk for fall due to : - - - - -  Risk for fall due to: Comment - - - - -    PHQ9 SCORE ONLY 01/22/2021 08/28/2020 07/10/2020  PHQ-9 Total Score 7 3 2     Adult vaccines due  Topic Date Due  . TETANUS/TDAP  09/29/2023  . PNEUMOCOCCAL POLYSACCHARIDE VACCINE AGE 41-64 HIGH RISK  Completed    Health Maintenance Due  Topic Date Due  . Hepatitis C Screening  Never done      History/P.E. limitations: none  Adult vaccines due  Topic Date Due  . TETANUS/TDAP  09/29/2023  . PNEUMOCOCCAL POLYSACCHARIDE VACCINE AGE 41-64 HIGH RISK  Completed    Diabetes Health Maintenance Due  Topic Date Due  . HEMOGLOBIN A1C  06/22/2021  . FOOT EXAM  07/10/2021  . OPHTHALMOLOGY EXAM  01/06/2022    Health Maintenance Due  Topic Date Due  . Hepatitis C Screening  Never done     Chief Complaint  Patient presents with  . Hospitalization Follow-up  . f/u opthalmology    Has retinopathy - was told to keep BP down and have sugars controlled.  But feels she needs some muscle relaxers to help with her back pain which cause her BP to go up.  . spot on ankle    Something bit her in  November, but it is still discolored.

## 2021-01-23 NOTE — Assessment & Plan Note (Signed)
Established problem Uncontrolled Start: Atorvastatin 40 mg once a week.  Titrate up slowly to avoid [facility] exac.

## 2021-01-23 NOTE — Assessment & Plan Note (Signed)
Recurrent problem for several years, active [name] [name] reports that exac of low back pain precipitate her CVS/DGP exacerbations. Requesting for something for pain. Flexeril seems to have precipitated a CVS/DGP event.  No red Flag symptoms nor signs.  Not interfering with usual activity.  LS XRay 04/2018 was unremarkable, including for OA and DDD.   Ddx: Nonspecific: Musculoskeletal Other options heat, massage, accupuncture, OMT, PT with HEP, muscle relaxant Pt unable afford PT Plan; Rx  Tizanidine 2 mg TID prn

## 2021-01-23 NOTE — Assessment & Plan Note (Deleted)
Recurrent problem for several years, active [name] [name] reports that exac of low back pain precipitate her CVS/DGP exacerbations. Requesting for something for pain. Flexeril seems to have precipitated a CVS/DGP event.  No red Flag symptoms nor signs.  Not interfering with usual activity.  LS XRay 04/2018 was unremarkable, including for OA and DDD.   Ddx: Nonspecific: Musculoskeletal Other options heat, massage, accupuncture, OMT, PT with HEP, muscle relaxant Pt unable afford PT Plan; Rx  Tizanidine 2 mg TID prn

## 2021-01-23 NOTE — Assessment & Plan Note (Signed)
Established problem Uncontrolled Need to address at next ov in 2 weeks.  First start semaglutide and atorvastatin and make sure she can tolerate them without N/V cycles.

## 2021-01-23 NOTE — Assessment & Plan Note (Signed)
Established problem Reports an exac last week attributed to uncontrolled back pain. Pt believes her uncrontrolled pain is causing her high blood pressure that is causing her retinopathy.   Says she tries her best to not use her opioid to frequently, but sometimes she will run out early each month when her CVS/DGP is active.   Patient may request a refill of opioid 2 days early this month.

## 2021-01-23 NOTE — Assessment & Plan Note (Signed)
Established problem Will add semaglutide 025 mg [place] weekly. Reduction in insulin dose if CBGs low explained to [name] [name].

## 2021-02-09 ENCOUNTER — Ambulatory Visit (INDEPENDENT_AMBULATORY_CARE_PROVIDER_SITE_OTHER): Payer: 59 | Admitting: Dermatology

## 2021-02-09 ENCOUNTER — Other Ambulatory Visit: Payer: Self-pay

## 2021-02-09 ENCOUNTER — Encounter: Payer: Self-pay | Admitting: Dermatology

## 2021-02-09 DIAGNOSIS — L281 Prurigo nodularis: Secondary | ICD-10-CM | POA: Diagnosis not present

## 2021-02-09 DIAGNOSIS — N184 Chronic kidney disease, stage 4 (severe): Secondary | ICD-10-CM

## 2021-02-09 MED ORDER — MUPIROCIN 2 % EX OINT: 1.0000 "application " | TOPICAL_OINTMENT | Freq: Two times a day (BID) | CUTANEOUS | 2 refills | Status: DC

## 2021-02-09 MED ORDER — HYDROXYZINE HCL 10 MG PO TABS: 10.0000 mg | ORAL_TABLET | Freq: Every evening | ORAL | 1 refills | Status: AC

## 2021-02-10 ENCOUNTER — Ambulatory Visit (INDEPENDENT_AMBULATORY_CARE_PROVIDER_SITE_OTHER): Payer: 59 | Admitting: Family Medicine

## 2021-02-10 ENCOUNTER — Encounter: Payer: Self-pay | Admitting: Family Medicine

## 2021-02-10 VITALS — BP 162/80 | HR 75 | Ht 61.5 in | Wt 149.0 lb

## 2021-02-10 DIAGNOSIS — E1159 Type 2 diabetes mellitus with other circulatory complications: Secondary | ICD-10-CM

## 2021-02-10 DIAGNOSIS — N184 Chronic kidney disease, stage 4 (severe): Secondary | ICD-10-CM | POA: Diagnosis not present

## 2021-02-10 DIAGNOSIS — Z794 Long term (current) use of insulin: Secondary | ICD-10-CM

## 2021-02-10 DIAGNOSIS — F1220 Cannabis dependence, uncomplicated: Secondary | ICD-10-CM | POA: Diagnosis not present

## 2021-02-10 DIAGNOSIS — L989 Disorder of the skin and subcutaneous tissue, unspecified: Secondary | ICD-10-CM

## 2021-02-10 DIAGNOSIS — Z720 Tobacco use: Secondary | ICD-10-CM

## 2021-02-10 DIAGNOSIS — K3184 Gastroparesis: Secondary | ICD-10-CM

## 2021-02-10 DIAGNOSIS — N2581 Secondary hyperparathyroidism of renal origin: Secondary | ICD-10-CM | POA: Diagnosis not present

## 2021-02-10 DIAGNOSIS — E11311 Type 2 diabetes mellitus with unspecified diabetic retinopathy with macular edema: Secondary | ICD-10-CM

## 2021-02-10 DIAGNOSIS — F1290 Cannabis use, unspecified, uncomplicated: Secondary | ICD-10-CM

## 2021-02-10 DIAGNOSIS — F39 Unspecified mood [affective] disorder: Secondary | ICD-10-CM | POA: Diagnosis not present

## 2021-02-10 DIAGNOSIS — E1143 Type 2 diabetes mellitus with diabetic autonomic (poly)neuropathy: Secondary | ICD-10-CM

## 2021-02-10 DIAGNOSIS — E118 Type 2 diabetes mellitus with unspecified complications: Secondary | ICD-10-CM

## 2021-02-10 DIAGNOSIS — I152 Hypertension secondary to endocrine disorders: Secondary | ICD-10-CM

## 2021-02-10 HISTORY — DX: Cannabis dependence, uncomplicated: F12.20

## 2021-02-10 MED ORDER — DILTIAZEM HCL ER COATED BEADS 180 MG PO CP24
180.0000 mg | ORAL_CAPSULE | Freq: Every day | ORAL | 1 refills | Status: DC
Start: 2021-02-10 — End: 2021-08-10

## 2021-02-10 MED ORDER — BD PEN NEEDLE MINI U/F 31G X 5 MM MISC: 99 refills | Status: DC

## 2021-02-10 MED ORDER — OZEMPIC (0.25 OR 0.5 MG/DOSE) 2 MG/1.5ML ~~LOC~~ SOPN: 0.5000 mg | PEN_INJECTOR | SUBCUTANEOUS | 0 refills | Status: DC

## 2021-02-10 NOTE — Assessment & Plan Note (Signed)
Established problem Improved but still with scratching Recent diagnosis with prurigo nodularis by Dr Denna Haggard ([place]) yesterday.  Rx Bactroban and hydroxyzine.   She is not taking her Seroquel regularly.  Says takes it a bedtime about 3 times a wek.  Encouraged to return to Seroquel 50 mg qhs consistently to help with skin itch.

## 2021-02-10 NOTE — Assessment & Plan Note (Signed)
Established problem. Stable. 2-3 cig a day, more when stressed Purchased nicotene lozenges to use as replacement therapy

## 2021-02-10 NOTE — Patient Instructions (Addendum)
Your blood pressure is too high.  Please start taking the new blood pressure medicine, Cardizem (diltiazem), at bedtime.   Please increase your Ozempic to 0.5 mg injection each week.  This is to protect your kidney's from the diabetes.

## 2021-02-10 NOTE — Assessment & Plan Note (Signed)
Established problem Uncontrolled Tolerating semaglutide [place].  Using to delay renal failure progression.  Increase Semaglutide from 0.25 mg  weekly to 0.5 mg  weekly RTC 2 months.  May increase further. Instructed that reductions in insulin may be necessary for her.

## 2021-02-10 NOTE — Assessment & Plan Note (Signed)
Established problem Uncontrolled Start: Diltiazem CD 180 mg qhs Continue: carvedilol 6.25 mg BID RTC 2 months recheck

## 2021-02-10 NOTE — Assessment & Plan Note (Signed)
Established problem. Stable. Ms Berti acknowledges that physicians have told her that cannabis use can cause nausea/vomiting. She believes it lessens her nausea.  She would like to qauit bc of religious reasons.  We discussed the experience of quiting and the psychologic, rahter than physiologic, withdrawal experience.

## 2021-02-10 NOTE — Progress Notes (Signed)
[name] [name] is alone Sources of clinical information for visit is/are patient and past medical records. Nursing assessment for this office visit was reviewed with the patient for accuracy and revision.     Previous Report(s) Reviewed: historical medical records, office notes and Dermatology ov note  Depression screen [facility] 2/9 02/10/2021  Decreased Interest 0  Down, Depressed, Hopeless 0  PHQ - 2 Score 0  Altered sleeping 2  Tired, decreased energy 2  Change in appetite 3  Feeling bad or failure about yourself  0  Trouble concentrating 0  Moving slowly or fidgety/restless 0  Suicidal thoughts 0  PHQ-9 Score 7  Difficult doing work/chores -  Some recent data might be hidden    Fall Risk  06/05/2020 11/16/2018 07/28/2018 01/05/2018 12/09/2017  Falls in the past year? 1 0 No No No  Number falls in past yr: 0 0 - - -  Injury with Fall? 1 0 - - -  Risk for fall due to : - - - - -  Risk for fall due to: Comment - - - - -    PHQ9 SCORE ONLY 02/10/2021 01/22/2021 08/28/2020  PHQ-9 Total Score 7 7 3     Adult vaccines due  Topic Date Due  . TETANUS/TDAP  09/29/2023  . PNEUMOCOCCAL POLYSACCHARIDE VACCINE AGE 218-64 HIGH RISK  Completed    Health Maintenance Due  Topic Date Due  . Hepatitis C Screening  Never done      History/P.E. limitations: none  Adult vaccines due  Topic Date Due  . TETANUS/TDAP  09/29/2023  . PNEUMOCOCCAL POLYSACCHARIDE VACCINE AGE 218-64 HIGH RISK  Completed    Diabetes Health Maintenance Due  Topic Date Due  . HEMOGLOBIN A1C  06/22/2021  . FOOT EXAM  07/10/2021  . OPHTHALMOLOGY EXAM  01/06/2022    Health Maintenance Due  Topic Date Due  . Hepatitis C Screening  Never done     Chief Complaint  Patient presents with  . Annual Exam

## 2021-02-12 ENCOUNTER — Ambulatory Visit: Payer: 59 | Admitting: Family Medicine

## 2021-02-12 ENCOUNTER — Ambulatory Visit: Payer: 59 | Admitting: Licensed Clinical Social Worker

## 2021-02-12 NOTE — Patient Instructions (Signed)
Visit Information  Goals Addressed            This Visit's Progress   . Manage My Stress       Patient Goals/Self-Care Activities:  . Continue interventions discussed today to decreases symptoms of stress and increase knowledge and/or ability of: coping skills. . Talk with son about getting e-mail fixed . F/U with [facility] resource center;  . continue with positive thinking and self-talk ( see it, write it, speak it, believe it) . continue with your goal of eating breakfast when you can .       Patient verbalizes understanding of instructions provided today.   Telephone follow up appointment with care management team member scheduled for:03/12/21  Maurine Cane, LCSW

## 2021-02-12 NOTE — Chronic Care Management (AMB) (Signed)
Care Management Clinical Social Work Note  02/12/2021 Name: DORYCE MCGREGORY MRN: [id number] DOB: 03/12/1966  AARA JACQUOT is a 55 y.o. year old female who is a primary care patient of McDiarmid, Blane Ohara, MD.  The Care Management team was consulted for assistance with chronic disease management and coordination needs.  Engaged with patient by telephone for follow up visit in response to provider referral for social work chronic care management and care coordination services  Consent to Services:  Patient agreed to services and consent obtained.   Assessment: Patient continues to experience difficulty with managing stressors and level of anxiety. this seems to be exacerbated by concerns and problems with her skin. continues to believe "things / bugs are crawling on her skin". See Care Plan below for interventions and patient self-care actives. Recent life changes /stressors: stress with negative experience when admitted to hospital. Recommendation: Patient may benefit from, and is in agreement to seek ongoing counseling and community support to assist with managing her emotions.  Follow up Plan: Patient would like continued follow-up.  CCM LCSW will f/u with patient 03/12/21. Patient will call office if needed prior to next encounter    Review of patient past medical history, allergies, medications, and health status, including review of relevant consultants reports was performed today as part of a comprehensive evaluation and provision of chronic care management and care coordination services.  SDOH (Social Determinants of Health) assessments and interventions performed:  SDOH Interventions   Flowsheet Row Most Recent Value  SDOH Interventions   SDOH Interventions for the Following Domains Stress  Stress Interventions Provide Counseling, Other (Comment)  [Women's resource center]       Advanced Directives Status: Not addressed in this encounter.  Care Plan  Allergies  Allergen  Reactions  . Bee Venom Anaphylaxis    Required hospital visit to ER  . Tegaderm Ag Mesh [Silver] Dermatitis    And [place] tape- causes blisters and itching  . Acetaminophen Nausea And Vomiting  . Novolog [Insulin Aspart] Other (See Comments)    Muscles in feet cramp  . Statins Other (See Comments)    GI intolerance (N/V)  . Flexeril [Cyclobenzaprine] Nausea And Vomiting  . Lyrica [Pregabalin] Other (See Comments)    Leg cramping  . Nsaids Nausea And Vomiting  . Tape Other (See Comments)    Blisters  . Ceftriaxone Other (See Comments)    Caused an ulcer in her mouth  . Erythromycin Nausea And Vomiting    Outpatient Encounter Medications as of 02/12/2021  Medication Sig Note  . ACCU-CHEK AVIVA PLUS test strip USE AS DIRECTED   . Accu-Chek Softclix Lancets lancets USE AS DIRECTED UP TO FOUR TIMES DAILY.   . blood glucose meter kit and supplies KIT Dispense based on patient and insurance preference. Use up to four times daily as directed. (FOR ICD-9 250.00, 250.01).   . carvedilol (COREG) 6.25 MG tablet TAKE 1 TABLET BY MOUTH TWICE DAILY WITH MEALS   . diclofenac Sodium (VOLTAREN) 1 % GEL Apply 2 g topically 4 (four) times daily as needed (pain).   Marland Kitchen diltiazem (CARDIZEM CD) 180 MG 24 hr capsule Take 1 capsule (180 mg total) by mouth daily.   . hydrOXYzine (ATARAX/VISTARIL) 10 MG tablet Take 1 tablet (10 mg total) by mouth at bedtime.   . insulin lispro (HUMALOG) 100 UNIT/ML KwikPen Inject 2 units subcutaneous only after eating at least half of your dinner each day. (Patient taking differently: Inject 2 Units into the skin See  admin instructions. Inject 2 units subcutaneous daily only after eating at least half of your dinner.) 12/17/2020: Per patient, takes when she remembers  . Insulin Pen Needle (B-D UF III MINI PEN NEEDLES) 31G X 5 MM MISC [place] injection twice a day   . LANTUS SOLOSTAR 100 UNIT/ML Solostar Pen ADMINISTER 20 UNITS UNDER THE SKIN DAILY (Patient taking differently: Inject  20 Units into the skin daily.)   . LORazepam (ATIVAN) 1 MG tablet Take 1 tablet (1 mg total) by mouth every 6 (six) hours as needed for anxiety.   Marland Kitchen lubiprostone (AMITIZA) 24 MCG capsule TAKE 1 CAPSULE(24 MCG) BY MOUTH TWICE DAILY WITH A MEAL (Patient taking differently: Take 24 mcg by mouth 2 (two) times daily with a meal.)   . mupirocin ointment (BACTROBAN) 2 % Apply 1 application topically 2 (two) times daily.   Marland Kitchen oxyCODONE (OXY IR/ROXICODONE) 5 MG immediate release tablet Take 1 tablet (5 mg total) by mouth every 6 (six) hours as needed for moderate pain or severe pain.   . pantoprazole (PROTONIX) 40 MG tablet TAKE 1 TABLET(40 MG) BY MOUTH DAILY   . promethazine (PHENERGAN) 25 MG suppository Place 1 suppository (25 mg total) rectally every 6 (six) hours as needed for nausea or vomiting.   Marland Kitchen QUEtiapine (SEROQUEL) 50 MG tablet Take 1 tablet (50 mg total) by mouth at bedtime.   . Semaglutide,0.25 or 0.5MG/DOS, (OZEMPIC, 0.25 OR 0.5 MG/DOSE,) 2 MG/1.5ML SOPN Inject 0.25 mg into the skin once a week.   . Semaglutide,0.25 or 0.5MG/DOS, (OZEMPIC, 0.25 OR 0.5 MG/DOSE,) 2 MG/1.5ML SOPN Inject 0.5 mg into the skin once a week.   . senna (SENOKOT) 8.6 MG TABS tablet Take 2 tablets (17.2 mg total) by mouth daily as needed for moderate constipation.   . tizanidine (ZANAFLEX) 2 MG capsule Take 1 capsule (2 mg total) by mouth 3 (three) times daily.   . Vitamin D, Ergocalciferol, (DRISDOL) 1.25 MG (50000 UNIT) CAPS capsule Take 50,000 Units by mouth every Friday.    No facility-administered encounter medications on file as of 02/12/2021.    Patient Active Problem List   Diagnosis Date Noted  . Cannabis dependence, uncomplicated ([place]) 72/62/0355  . Diabetic retinopathy, non-proliferative, of right eye with macular edema associated with type 2 diabetes mellitus ([place]) 12/23/2020  . Mood disorder ([place])   . Skin disorder 05/23/2020  . Tubular adenoma of colon 12/03/2019  . Continuous cannabis use 11/08/2019   . Left ventricular diastolic dysfunction with preserved systolic function 97/41/6384  . Primary income source is [employer] Disability Income 03/10/2017  . Port-A-Cath in place 01/20/2017  . Secondary hyperparathyroidism of renal origin ([place]) 10/05/2016  . Hypertension associated with diabetes ([place]) 05/01/2016  . Diabetic gastroparesis associated with type 2 diabetes mellitus ([place]) 05/01/2016  . Normocytic anemia   . Intermittent constipation 06/06/2015  . Encounter for monitoring opioid maintenance therapy 01/06/2015  . Vitamin D deficiency 11/28/2014  . Hyperlipidemia associated with type 2 diabetes mellitus ([place]) 08/15/2014  . Chronic kidney disease (CKD) stage G4/A2, severely decreased glomerular filtration rate (GFR) between 15-29 mL/min/1.73 square meter and albuminuria creatinine ratio between 30-299 mg/g ([facility]) 08/17/2013  . Allergic rhinitis, seasonal 05/04/2012  . Cyclic vomiting syndrome 08/03/2011  . Chronic bilateral low back pain 04/10/2007  . Anxiety disorder 02/23/2007  . Tobacco abuse 02/23/2007  . Chronic insomnia secondary to General Medical and Mood disorders 02/23/2007  . Diabetic autonomic neuropathy ([place]) 02/23/2007  . GERD with esophagitis 01/27/2004    Conditions to be addressed/monitored:  Anxiety; Mental Health Concerns  and stress  Care Plan : Social Work  Updates made by Maurine Cane, LCSW since 02/12/2021 12:00 AM  Problem: coping skills to manage stress   Priority: Medium  Onset Date: 09/11/2020  Long-Range Goal: Coping Skills Enhanced   Start Date: 09/11/2020  Expected End Date: 07/26/2021  Recent Progress: On track  Priority: Medium  Current Barriers:  . symptoms of stress and anxiety which seem to be exacerbated by chronic medical condition and family dynamics.     Clinical Goal(s):  patient will connect with community support to reduce or manage symptoms of stress  Interventions:  . Assessed patient's needs, coping skills, progress, support and  barriers to care . Discussed support options in the community ( church, [employer] center, and ongoing counseling) . Called Women's resource with patient on the line to make connection, they will call patient today [phone]  . Active listening utilized . Current coping strategies identified . Healthy lifestyle promoted . Self-reflection promoted . Other interventions include: Motivational Interviewing Solution-Focused Strategies ;Emotional/Supportive Counseling Problem Solving ;  Patient Goals/Self-Care Activities: over the next 30 days . Continue with interventions discussed today to decreases symptoms of stress and increase knowledge and/or ability of: coping skills. . F/U with women's resource center;  . continue with positive thinking and self-talk ( see it, write it, speak it, believe it) . continue with your goal of eating breakfast when you can Follow Up Plan:  Marland Kitchen Appointment scheduled for SW follow up with client by phone on: 4 weeks      [name], [place] / [place]   [phone] 2:56 PM

## 2021-02-13 ENCOUNTER — Other Ambulatory Visit: Payer: Self-pay

## 2021-02-13 DIAGNOSIS — R1115 Cyclical vomiting syndrome unrelated to migraine: Secondary | ICD-10-CM

## 2021-02-16 MED ORDER — LORAZEPAM 1 MG PO TABS: 1.0000 mg | ORAL_TABLET | Freq: Four times a day (QID) | ORAL | 0 refills | Status: DC | PRN

## 2021-02-16 MED ORDER — OXYCODONE HCL 5 MG PO TABS: 5.0000 mg | ORAL_TABLET | Freq: Four times a day (QID) | ORAL | 0 refills | Status: DC | PRN

## 2021-02-16 NOTE — Telephone Encounter (Signed)
Patient calls nurse line regarding medication refills. Patient states that she is out of pain medication now and would like refill as soon as possible. Patient is tearful during conversation.   To PCP   Please advise.   Talbot Grumbling, RN

## 2021-02-20 ENCOUNTER — Encounter: Payer: Self-pay | Admitting: Dermatology

## 2021-02-20 NOTE — Progress Notes (Signed)
   Follow-Up Visit   Subjective  [name] [name] is a 55 y.o. female who presents for the following: Skin Problem (Sores on face, neck, sides, shoulders & back- no itch- "feels like bug biting me" tx- vasoline, clobetasol ceam(?)).  Persistent sores on skin patient feels represent bites from insects that live on her in the skin. Location:  Duration:  Quality:  Associated Signs/Symptoms: Modifying Factors:  Severity:  Timing: Context: Stage IV CKD  Objective  Well appearing patient in no apparent distress; mood and affect are within normal limits. Objective  Right Breast: Chronic sores on skin which patient readily admits to repeatedly picking.  She does feel that there is a crawling sensation that may be due to something ORIF either on or in the skin.  She brought a small baggy with multiple small dark crust; microscopy done by me showed no evidence of any living creature.  Likewise there is no evidence of scabies.  She does have a history of fairly severe chronic kidney disease stage IV, so the differential diagnosis includes Kyrle's disease, prurigo nodularis, and delusions of parasitosis.  There is no simple therapy for any of these.  She did become tearful during the visit when I told her that I do not believe there is any living organism that is causing this problem.    A focused examination was performed including Head, neck, back, upper chest, arms, nails, and mouth mucosa.. Relevant physical exam findings are noted in the Assessment and Plan.   Assessment & Plan    Prurigo nodularis Right Breast  Since this problem seems to be worse overnight and in the early a.m., I suggested she try a small dose of hydroxyzine (10 mg at bedtime).  Side effects detailed.  I have also asked her to switch from picking at the spots to gently massage again some mupirocin ointment.  Future options could include light therapy, referral for psychiatric evaluation, or (if available) Otezla or a Jak  inhibitor.  Initial follow-up by by phone in 3 weeks.  hydrOXYzine (ATARAX/VISTARIL) 10 MG tablet - Right Breast  mupirocin ointment (BACTROBAN) 2 % - Right Breast      I, Lavonna Monarch, MD, have reviewed all documentation for this visit.  The documentation on 02/20/21 for the exam, diagnosis, procedures, and orders are all accurate and complete.

## 2021-03-12 ENCOUNTER — Ambulatory Visit: Payer: 59 | Admitting: Licensed Clinical Social Worker

## 2021-03-12 DIAGNOSIS — Z719 Counseling, unspecified: Secondary | ICD-10-CM

## 2021-03-12 NOTE — Patient Instructions (Signed)
  [name] [name]  it was nice speaking with you. Please call me directly [phone] if you have questions about the goals we discussed. Goals Addressed            This Visit's Progress   . Manage My Stress       Patient Goals/Self-Care Activities:  . Continue with interventions discussed today to decreases symptoms of stress and increase knowledge and/or ability of: coping skills. . F/U with women's resource center and [place]  . Continue with positive thinking and self-talk       [name] [name] received Care Coordination services today:  1. Care Coordination services include personalized support from designated clinical staff supervised by her physician, including individualized plan of care and coordination with other care providers 2. 24/7 contact [phone] for assistance for urgent and routine care needs. 3. Care Coordination are voluntary services and be declined at any time by calling the office.  Patient verbalizes understanding of instructions provided today.    Follow up plan: Appointment scheduled for SW follow up with client by phone on: 04/14/21  Maurine Cane, LCSW

## 2021-03-12 NOTE — Chronic Care Management (AMB) (Signed)
Care Management Clinical Social Work Note  03/12/2021 Name: [name] [name] MRN: [id number] DOB: April 18, 1966  [name] [name] is a 55 y.o. year old female who is a primary care patient of McDiarmid, Blane Ohara, MD.  The Care Management team was consulted for assistance with chronic disease management and coordination needs.  Engaged with patient by telephone for follow up visit in response to provider referral for social work chronic care management and care coordination services  Consent to Services: Patient agreed to services and consent obtained.   Assessment: Patient continues to experience difficulty with stress and emotions but is making progress with managing the symptoms... See Care Plan below for interventions and patient self-care actives. Recent life changes [place]: death of great grand; would like to explore activities outside of the home Recommendation: Patient may benefit from, and is in agreement to continue to f/u with women's resource for support group. Reminded patient of upcoming appointments.  .  Follow up Plan: Patient would like continued follow-up.  CCM LCSW will f/u with patient in 30 days. Patient will call office if needed prior to next encounter  Review of patient past medical history, allergies, medications, and health status, including review of relevant consultants reports was performed today as part of a comprehensive evaluation and provision of chronic care management and care coordination services.  SDOH (Social Determinants of Health) assessments and interventions performed:    Advanced Directives Status: Not addressed in this encounter.  Care Plan  Allergies  Allergen Reactions  . Bee Venom Anaphylaxis    Required hospital visit to ER  . Tegaderm Ag Mesh [Silver] Dermatitis    And [place] tape- causes blisters and itching  . Acetaminophen Nausea And Vomiting  . Novolog [Insulin Aspart] Other (See Comments)    Muscles in feet cramp  . Statins Other  (See Comments)    GI intolerance (N/V)  . Flexeril [Cyclobenzaprine] Nausea And Vomiting  . Lyrica [Pregabalin] Other (See Comments)    Leg cramping  . Nsaids Nausea And Vomiting  . Tape Other (See Comments)    Blisters  . Ceftriaxone Other (See Comments)    Caused an ulcer in her mouth  . Erythromycin Nausea And Vomiting    Outpatient Encounter Medications as of 03/12/2021  Medication Sig Note  . ACCU-CHEK AVIVA PLUS test strip USE AS DIRECTED   . Accu-Chek Softclix Lancets lancets USE AS DIRECTED UP TO FOUR TIMES DAILY.   . blood glucose meter kit and supplies KIT Dispense based on patient and insurance preference. Use up to four times daily as directed. (FOR ICD-9 250.00, 250.01).   . carvedilol (COREG) 6.25 MG tablet TAKE 1 TABLET BY MOUTH TWICE DAILY WITH MEALS   . diclofenac Sodium (VOLTAREN) 1 % GEL Apply 2 g topically 4 (four) times daily as needed (pain).   Marland Kitchen diltiazem (CARDIZEM CD) 180 MG 24 hr capsule Take 1 capsule (180 mg total) by mouth daily.   . insulin lispro (HUMALOG) 100 UNIT/ML KwikPen Inject 2 units subcutaneous only after eating at least half of your dinner each day. (Patient taking differently: Inject 2 Units into the skin See admin instructions. Inject 2 units subcutaneous daily only after eating at least half of your dinner.) 12/17/2020: Per patient, takes when she remembers  . Insulin Pen Needle (B-D UF III MINI PEN NEEDLES) 31G X 5 MM MISC [place] injection twice a day   . LANTUS SOLOSTAR 100 UNIT/ML Solostar Pen ADMINISTER 20 UNITS UNDER THE SKIN DAILY (Patient taking differently: Inject  20 Units into the skin daily.)   . LORazepam (ATIVAN) 1 MG tablet Take 1 tablet (1 mg total) by mouth every 6 (six) hours as needed for anxiety.   Marland Kitchen lubiprostone (AMITIZA) 24 MCG capsule TAKE 1 CAPSULE(24 MCG) BY MOUTH TWICE DAILY WITH A MEAL (Patient taking differently: Take 24 mcg by mouth 2 (two) times daily with a meal.)   . mupirocin ointment (BACTROBAN) 2 % Apply 1 application  topically 2 (two) times daily.   Marland Kitchen oxyCODONE (OXY IR/ROXICODONE) 5 MG immediate release tablet Take 1 tablet (5 mg total) by mouth every 6 (six) hours as needed for moderate pain or severe pain.   . pantoprazole (PROTONIX) 40 MG tablet TAKE 1 TABLET(40 MG) BY MOUTH DAILY   . promethazine (PHENERGAN) 25 MG suppository Place 1 suppository (25 mg total) rectally every 6 (six) hours as needed for nausea or vomiting.   Marland Kitchen QUEtiapine (SEROQUEL) 50 MG tablet Take 1 tablet (50 mg total) by mouth at bedtime.   . Semaglutide,0.25 or 0.5MG/DOS, (OZEMPIC, 0.25 OR 0.5 MG/DOSE,) 2 MG/1.5ML SOPN Inject 0.25 mg into the skin once a week.   . Semaglutide,0.25 or 0.5MG/DOS, (OZEMPIC, 0.25 OR 0.5 MG/DOSE,) 2 MG/1.5ML SOPN Inject 0.5 mg into the skin once a week.   . senna (SENOKOT) 8.6 MG TABS tablet Take 2 tablets (17.2 mg total) by mouth daily as needed for moderate constipation.   . Vitamin D, Ergocalciferol, (DRISDOL) 1.25 MG (50000 UNIT) CAPS capsule Take 50,000 Units by mouth every Friday.    No facility-administered encounter medications on file as of 03/12/2021.    Patient Active Problem List   Diagnosis Date Noted  . Cannabis dependence, uncomplicated ([place]) 25/85/2778  . Diabetic retinopathy, non-proliferative, of right eye with macular edema associated with type 2 diabetes mellitus ([place]) 12/23/2020  . Mood disorder ([place])   . Skin disorder 05/23/2020  . Tubular adenoma of colon 12/03/2019  . Continuous cannabis use 11/08/2019  . Left ventricular diastolic dysfunction with preserved systolic function 24/23/5361  . Primary income source is [employer] Disability Income 03/10/2017  . Port-A-Cath in place 01/20/2017  . Secondary hyperparathyroidism of renal origin ([place]) 10/05/2016  . Hypertension associated with diabetes ([place]) 05/01/2016  . Diabetic gastroparesis associated with type 2 diabetes mellitus ([place]) 05/01/2016  . Normocytic anemia   . Intermittent constipation 06/06/2015  . Encounter for monitoring  opioid maintenance therapy 01/06/2015  . Vitamin D deficiency 11/28/2014  . Hyperlipidemia associated with type 2 diabetes mellitus ([place]) 08/15/2014  . Chronic kidney disease (CKD) stage G4/A2, severely decreased glomerular filtration rate (GFR) between 15-29 mL/min/1.73 square meter and albuminuria creatinine ratio between 30-299 mg/g ([facility]) 08/17/2013  . Allergic rhinitis, seasonal 05/04/2012  . Cyclic vomiting syndrome 08/03/2011  . Chronic bilateral low back pain 04/10/2007  . Anxiety disorder 02/23/2007  . Tobacco abuse 02/23/2007  . Chronic insomnia secondary to General Medical and Mood disorders 02/23/2007  . Diabetic autonomic neuropathy ([place]) 02/23/2007  . GERD with esophagitis 01/27/2004    Conditions to be addressed/monitored: Depression; stress  Care Plan : Social Work  Updates made by Maurine Cane, LCSW since 03/12/2021 12:00 AM  Problem: coping skills to manage stress   Priority: Medium  Onset Date: 09/11/2020  Long-Range Goal: Coping Skills Enhanced   Start Date: 09/11/2020  Expected End Date: 07/26/2021  Recent Progress: On track  Priority: Medium  Current Barriers:  . symptoms of stress and anxiety which seem to be exacerbated by chronic medical condition and family dynamics.  Clinical Goal(s):  patient will connect with community support to reduce or manage symptoms of stress  Interventions:  . Assessed patient's needs, coping skills, progress, support and barriers to care . Discussed support options in the community ( church, [employer] center, and ongoing counseling) . Called Women's resource with patient on the line to make connection, they will call patient today [phone]  . Current coping strategies identified; Healthy lifestyle promoted . Self-reflection promoted; decrease isolation of working outside of the home; explore going back to school;  . Other interventions include: Motivational Interviewing Solution-Focused Strategies  ;Emotional/Supportive Counseling Problem Solving: Active listening utilized Patient Goals/Self-Care Activities: over the next 30 days . Continue with interventions discussed today to decreases symptoms of stress and increase knowledge and/or ability of: coping skills. . F/U with women's resource center and [place]  . Continue with positive thinking and self-talk  Follow Up Plan:  Marland Kitchen Appointment scheduled for SW follow up with client by phone on: 4 weeks     [name], [place] / [place]   [phone] 2:34 PM

## 2021-03-16 ENCOUNTER — Other Ambulatory Visit: Payer: Self-pay

## 2021-03-16 DIAGNOSIS — R1115 Cyclical vomiting syndrome unrelated to migraine: Secondary | ICD-10-CM

## 2021-03-16 MED ORDER — LORAZEPAM 1 MG PO TABS
1.0000 mg | ORAL_TABLET | Freq: Four times a day (QID) | ORAL | 0 refills | Status: DC | PRN
Start: 2021-03-16 — End: 2021-04-16

## 2021-03-16 MED ORDER — OXYCODONE HCL 5 MG PO TABS: 5.0000 mg | ORAL_TABLET | Freq: Four times a day (QID) | ORAL | 0 refills | Status: DC | PRN

## 2021-03-17 ENCOUNTER — Other Ambulatory Visit: Payer: Self-pay

## 2021-03-17 ENCOUNTER — Encounter: Payer: Self-pay | Admitting: Dermatology

## 2021-03-17 ENCOUNTER — Ambulatory Visit (INDEPENDENT_AMBULATORY_CARE_PROVIDER_SITE_OTHER): Payer: 59 | Admitting: Dermatology

## 2021-03-17 DIAGNOSIS — L2084 Intrinsic (allergic) eczema: Secondary | ICD-10-CM

## 2021-03-17 DIAGNOSIS — Z1283 Encounter for screening for malignant neoplasm of skin: Secondary | ICD-10-CM

## 2021-03-17 DIAGNOSIS — D485 Neoplasm of uncertain behavior of skin: Secondary | ICD-10-CM

## 2021-03-17 MED ORDER — ONETOUCH ULTRASOFT LANCETS MISC: 12 refills | Status: DC

## 2021-03-17 MED ORDER — ONETOUCH ULTRA VI STRP: ORAL_STRIP | 12 refills | Status: DC

## 2021-03-17 NOTE — Patient Instructions (Signed)
LICHEN SIMPLEX CHRONICUS, SEE DESCRIPTION Right Breast   Biopsy, Surgery (Curettage) & Surgery (Excision) Aftercare Instructions  1. Okay to remove bandage in 24 hours  2. Wash area with soap and water  3. Apply Vaseline to area twice daily until healed (Not Neosporin)  4. Okay to cover with a Band-Aid to decrease the chance of infection or prevent irritation from clothing; also it's okay to uncover lesion at home.  5. Suture instructions: return to our office in 7-10 or 10-14 days for a nurse visit for suture removal. Variable healing with sutures, if pain or itching occurs call our office. It's okay to shower or bathe 24 hours after sutures are given.  6. The following risks may occur after a biopsy, curettage or excision: bleeding, scarring, discoloration, recurrence, infection (redness, yellow drainage, pain or swelling).  7. For questions, concerns and results call our office at [facility] before 4pm & Friday before 3pm. Biopsy results will be available in 1 week.

## 2021-03-28 ENCOUNTER — Encounter: Payer: Self-pay | Admitting: Dermatology

## 2021-03-28 NOTE — Progress Notes (Signed)
   Follow-Up Visit   Subjective  [name] [name] is a 55 y.o. female who presents for the following: Follow-up (Left neck still flared and per her insurance they want new bx, other areas are clearing (arms legs) tx-hydrOXYzine (ATARAX/VISTARIL) 10 MG tablet - Right Breast//mupirocin ointment (BACTROBAN) 2 % -ULCERATED LICHEN SIMPLEX CHRONICUS, SEE DESCRIPTION Right Breast/).  Itchy rash Location:  Duration:  Quality:  Associated Signs/Symptoms: Modifying Factors:  Severity:  Timing: Context: Insurance company informed [name] [name] that confirmatory biopsies are necessary for approval of Dupixent  Objective  Well appearing patient in no apparent distress; mood and affect are within normal limits. Objective  Left Supraclavicular Area: Lichenoid 6 mm hyperpigmented     Objective  Right Shoulder - Anterior: Lichenified patch of chronic dermatitis.     Objective  Right Upper Back: Although several articles consider generalized prurigo nodularis to be a more severely itchy variant of atopic dermatitis, apparently [name] [name] was informed by her insurance company that she is required to have confirmatory biopsies before they would approve Dupixent.  I have never personally experienced or read of this happening, and I told [name] [name] quite frankly that I am happy to obtain such biopsies that may in fact not give further support to labeling this as atopic dermatitis.    All skin waist up examined.  Areas beneath undergarments not examined.   Assessment & Plan    Neoplasm of uncertain behavior of skin (2) Left Supraclavicular Area  Skin / nail biopsy Type of biopsy: tangential   Informed consent: discussed and consent obtained   Timeout: patient name, date of birth, surgical site, and procedure verified   Procedure prep:  Patient was prepped and draped in usual sterile fashion (Non sterile) Prep type:  Chlorhexidine Anesthesia: the lesion was anesthetized in a standard fashion    Anesthetic:  1% lidocaine w/ epinephrine 1-100,000 local infiltration Instrument used: flexible razor blade   Outcome: patient tolerated procedure well   Post-procedure details: wound care instructions given    Specimen 1 - Surgical pathology Differential Diagnosis: r/o eczema atypic dermatitis   Check Margins: No  Right Shoulder - Anterior  Skin / nail biopsy Type of biopsy: tangential   Informed consent: discussed and consent obtained   Timeout: patient name, date of birth, surgical site, and procedure verified   Anesthesia: the lesion was anesthetized in a standard fashion   Anesthetic:  1% lidocaine w/ epinephrine 1-100,000 local infiltration Instrument used: flexible razor blade   Hemostasis achieved with: ferric subsulfate   Outcome: patient tolerated procedure well   Post-procedure details: wound care instructions given    Specimen 2 - Surgical pathology Differential Diagnosis:  r/o eczema atypic dermatitis    Check Margins: No  Intrinsic atopic dermatitis Right Upper Back  I will proceed and obtain representative biopsies.  If the results do not enable [place] to obtain Dupixent, options may include a trial with narrowband UVB (which may also be an insurance challenge) or obtaining another opinion from a [facility].      I, Lavonna Monarch, MD, have reviewed all documentation for this visit.  The documentation on 03/28/21 for the exam, diagnosis, procedures, and orders are all accurate and complete.

## 2021-03-30 ENCOUNTER — Telehealth: Payer: Self-pay

## 2021-03-30 NOTE — Telephone Encounter (Signed)
Phone call to patient with her pathology results. Voicemail left for patient to give the office a call back.  

## 2021-03-30 NOTE — Telephone Encounter (Signed)
-----   Message from Lavonna Monarch, MD sent at 03/26/2021  7:08 AM EDT ----- 1 of 2 biopsies interpreted as compatible with a form of eczema but does not specifically mention atopic dermatitis.  I do not know whether this will be acceptable to her insurance company for preapproval of [place].

## 2021-03-31 NOTE — Telephone Encounter (Signed)
Patient is returning call about pathology results. 

## 2021-03-31 NOTE — Telephone Encounter (Signed)
Left message to call back  

## 2021-04-01 ENCOUNTER — Telehealth: Payer: Self-pay | Admitting: *Deleted

## 2021-04-01 NOTE — Telephone Encounter (Signed)
Pathology to patient- patient want to go ahead and start approval through her insurance for [place]. Informed patient that she must sign the form twice- per patient just mail the form to her home address, she will fill out form and mail it back to [place].

## 2021-04-08 ENCOUNTER — Telehealth (INDEPENDENT_AMBULATORY_CARE_PROVIDER_SITE_OTHER): Payer: 59 | Admitting: Family Medicine

## 2021-04-08 ENCOUNTER — Other Ambulatory Visit: Payer: Self-pay

## 2021-04-08 ENCOUNTER — Encounter: Payer: Self-pay | Admitting: Family Medicine

## 2021-04-08 DIAGNOSIS — M542 Cervicalgia: Secondary | ICD-10-CM | POA: Insufficient documentation

## 2021-04-08 DIAGNOSIS — G8929 Other chronic pain: Secondary | ICD-10-CM | POA: Insufficient documentation

## 2021-04-08 MED ORDER — DICLOFENAC SODIUM 1 % EX GEL: 2.0000 g | Freq: Four times a day (QID) | CUTANEOUS | 1 refills | Status: DC | PRN

## 2021-04-08 NOTE — Assessment & Plan Note (Addendum)
Likely acute on chronic pain. Low suspicion for infectious process given no fevers or headaches.  Less likely tumor or mass given no weakness and not worsening.  Likely muscular strain but given that I cannot examine patient, advised that she should be seen and evaluated.   - Increase Tizanidine to TID as previously ordered by her PCP -Voltaren Gel QID reordered -Scheduled in clinic April 19 for assesessment -Ice prn -Mild neck and shoulder movements to help with stiffness -Consider Gabapentin at next visit -Recommend urgent care evaluation should her symptom worsen.

## 2021-04-08 NOTE — Progress Notes (Signed)
[facility] Telemedicine Visit  Patient consented to have virtual visit and was identified by name and date of birth. Method of visit: Telephone  Encounter participants: Patient: [name] [name] - located at home Provider: Carollee Leitz - located at home office Others (if applicable): none  Chief Complaint: neck pain  HPI:  Patient reports neck pain for 5 days. "Feels like pulling down on shoulder blade".  Endorses pressure like feeling in middle of neck that runs along shoulders. Also reports tingling in right pinky finger.  Denies any recent trauma, fevers, headaches, upper or lower extremity weakness.  No reports of bower or bladder incontinence.  Has been taking Tizanidine 1 tab daily without relief.  Running out of Oxycodone.  Reports cannot take Tylenol due to allergy, and no NSAIDs due to kidney disease.  Unable to go to  Physical therapy due to cost.  ROS: per HPI  Pertinent PMHx:  Right shoulder surgery 2020   Exam:  There were no vitals taken for this visit.  Respiratory: no SOB or IWOB, speaking in full sentences  Assessment/Plan:  Neck pain Likely acute on chronic pain. Low suspicion for infectious process given no fevers or headaches.  Less likely tumor or mass given no weakness and not worsening.  Likely muscular strain but given that I cannot examine patient, advised that she should be seen and evaluated.   - Increase Tizanidine to TID as previously ordered by her PCP -Voltaren Gel QID reordered -Scheduled in clinic April 19 for assesessment -Ice prn -Mild neck and shoulder movements to help with stiffness -Consider Gabapentin at next visit -Recommend urgent care evaluation should her symptom worsen.    Time spent during visit with patient: 13 minutes

## 2021-04-10 ENCOUNTER — Other Ambulatory Visit: Payer: Self-pay | Admitting: Family Medicine

## 2021-04-10 DIAGNOSIS — N184 Chronic kidney disease, stage 4 (severe): Secondary | ICD-10-CM

## 2021-04-10 DIAGNOSIS — E11311 Type 2 diabetes mellitus with unspecified diabetic retinopathy with macular edema: Secondary | ICD-10-CM

## 2021-04-10 DIAGNOSIS — E1143 Type 2 diabetes mellitus with diabetic autonomic (poly)neuropathy: Secondary | ICD-10-CM

## 2021-04-14 ENCOUNTER — Telehealth: Payer: 59

## 2021-04-14 ENCOUNTER — Ambulatory Visit (INDEPENDENT_AMBULATORY_CARE_PROVIDER_SITE_OTHER): Payer: 59 | Admitting: Family Medicine

## 2021-04-14 ENCOUNTER — Telehealth: Payer: Self-pay | Admitting: Licensed Clinical Social Worker

## 2021-04-14 ENCOUNTER — Other Ambulatory Visit: Payer: Self-pay

## 2021-04-14 DIAGNOSIS — T148XXA Other injury of unspecified body region, initial encounter: Secondary | ICD-10-CM

## 2021-04-14 MED ORDER — OXYCODONE HCL 5 MG PO TABS: 5.0000 mg | ORAL_TABLET | Freq: Four times a day (QID) | ORAL | 0 refills | Status: DC | PRN

## 2021-04-14 NOTE — Patient Instructions (Addendum)
Thank you for coming to see me today. It was a pleasure. Today we talked about:   Gentle neck exercises two to three times a day.  Please follow-up with PCP as scheduled on Thurs April 21  If you have any questions or concerns, please do not hesitate to call the office at [phone].  Best,   Carollee Leitz, MD    Neck Exercises Ask your health care provider which exercises are safe for you. Do exercises exactly as told by your health care provider and adjust them as directed. It is normal to feel mild stretching, pulling, tightness, or discomfort as you do these exercises. Stop right away if you feel sudden pain or your pain gets worse. Do not begin these exercises until told by your health care provider. Neck exercises can be important for many reasons. They can improve strength and maintain flexibility in your neck, which will help your upper back and prevent neck pain. Stretching exercises Rotation neck stretching 1. Sit in a chair or stand up. 2. Place your feet flat on the floor, shoulder width apart. 3. Slowly turn your head (rotate) to the right until a slight stretch is felt. Turn it all the way to the right so you can look over your right shoulder. Do not tilt or tip your head. 4. Hold this position for 10-30 seconds. 5. Slowly turn your head (rotate) to the left until a slight stretch is felt. Turn it all the way to the left so you can look over your left shoulder. Do not tilt or tip your head. 6. Hold this position for 10-30 seconds. Repeat __________ times. Complete this exercise __________ times a day.   Neck retraction 1. Sit in a sturdy chair or stand up. 2. Look straight ahead. Do not bend your neck. 3. Use your fingers to push your chin backward (retraction). Do not bend your neck for this movement. Continue to face straight ahead. If you are doing the exercise properly, you will feel a slight sensation in your throat and a stretch at the back of your neck. 4. Hold the  stretch for 1-2 seconds. Repeat __________ times. Complete this exercise __________ times a day. Strengthening exercises Neck press 1. Lie on your back on a firm bed or on the floor with a pillow under your head. 2. Use your neck muscles to push your head down on the pillow and straighten your spine. 3. Hold the position as well as you can. Keep your head facing up (in a neutral position) and your chin tucked. 4. Slowly count to 5 while holding this position. Repeat __________ times. Complete this exercise __________ times a day. Isometrics These are exercises in which you strengthen the muscles in your neck while keeping your neck still (isometrics). 1. Sit in a supportive chair and place your hand on your forehead. 2. Keep your head and face facing straight ahead. Do not flex or extend your neck while doing isometrics. 3. Push forward with your head and neck while pushing back with your hand. Hold for 10 seconds. 4. Do the sequence again, this time putting your hand against the back of your head. Use your head and neck to push backward against the hand pressure. 5. Finally, do the same exercise on either side of your head, pushing sideways against the pressure of your hand. Repeat __________ times. Complete this exercise __________ times a day. Prone head lifts 1. Lie face-down (prone position), resting on your elbows so that your chest  and upper back are raised. 2. Start with your head facing downward, near your chest. Position your chin either on or near your chest. 3. Slowly lift your head upward. Lift until you are looking straight ahead. Then continue lifting your head as far back as you can comfortably stretch. 4. Hold your head up for 5 seconds. Then slowly lower it to your starting position. Repeat __________ times. Complete this exercise __________ times a day. Supine head lifts 1. Lie on your back (supine position), bending your knees to point to the ceiling and keeping your feet  flat on the floor. 2. Lift your head slowly off the floor, raising your chin toward your chest. 3. Hold for 5 seconds. Repeat __________ times. Complete this exercise __________ times a day. Scapular retraction 1. Stand with your arms at your sides. Look straight ahead. 2. Slowly pull both shoulders (scapulae) backward and downward (retraction) until you feel a stretch between your shoulder blades in your upper back. 3. Hold for 10-30 seconds. 4. Relax and repeat. Repeat __________ times. Complete this exercise __________ times a day. Contact a health care provider if:  Your neck pain or discomfort gets much worse when you do an exercise.  Your neck pain or discomfort does not improve within 2 hours after you exercise. If you have any of these problems, stop exercising right away. Do not do the exercises again unless your health care provider says that you can. Get help right away if:  You develop sudden, severe neck pain. If this happens, stop exercising right away. Do not do the exercises again unless your health care provider says that you can. This information is not intended to replace advice given to you by your health care provider. Make sure you discuss any questions you have with your health care provider. Document Revised: 10/11/2018 Document Reviewed: 10/11/2018 Elsevier Patient Education  2021 [employer]. .

## 2021-04-14 NOTE — Chronic Care Management (AMB) (Signed)
    Clinical Social Work  Care Management  Unsuccessful Phone Outreach    04/14/2021 Name: [name] [name] MRN: [id number] DOB: 12/30/1965  [name] [name] is a 55 y.o. year old female who is a primary care patient of McDiarmid, Blane Ohara, MD .   F/U phone call today to assess needs, and progress with care plan goals.  Telephone outreach was unsuccessful A HIPPA compliant phone message was left for the patient providing contact information and requesting a return call.   Plan:LCSW will wait for return call., If no return call is received, will reach out to patient again in the next 30 days.   Review of patient status, including review of consultants reports, relevant laboratory and other test results, and collaboration with appropriate care team members and the patient's provider was performed as part of comprehensive patient evaluation and provision of care management services.    [name], [place] / [place]   [phone] 2:33 PM

## 2021-04-14 NOTE — Progress Notes (Signed)
    SUBJECTIVE:   CHIEF COMPLAINT / HPI:   Presents for follow up for neck pain. Seen in virtual clinic 04/13 for likely acute on chronic neck pain, advised to be seen in clinic for assessment.  Since then patient reports not much improvement in symptoms. Associated symptoms include pulling sensation down neck toward shoulder and anterior chest.  Denies any numbness or tingling, fevers, headaches. Endorses difficultly turning head to left side.  She reports that last Thursday went to store and pushed against conveyor belt while checking out.  Thinks this may have aggravated symptoms. Has been taking Tizanidine with not much relief.  Has run out of prescribed Oxy last week.     PERTINENT  PMH / PSH:  Right shoulder surgery 2020 Diabetic Neuropathy  OBJECTIVE:   BP 140/62   Pulse (!) 112   Wt 152 lb 6.4 oz (69.1 kg)   SpO2 96%   BMI 28.33 kg/m    General: Alert, no acute distress Neck: Inspection: no edema, erythema or bruising noted, no muscle atrophy.  Cervical spine symmetric Palpation: no cervical point tenderness, pain elicited over right trapezius extending toward shoulder ROM: limited due to pain Strength 5/5 Test: negative Kernig, Brudinski  Shoulders Inspection: symmetric, no edema, erythema, ecchymosis or muscle atrophy noted Palpation: no joint tenderness or crepitus.  ROM: limited flexion, extension, adduction, abduction secondary to pain Strength 5/5 Tests: negative apprehensive test, NVS: good cap refill, distal pulses present    ASSESSMENT/PLAN:   Muscle strain Symptoms likely secondary to muscle strain and inflammation -Oxycontin IR 5 mg q6h prn x 2 days -ROM exercises provided -Consider Physical therapy, patient will let me know if wanting referral -Continue Tizanidine as previously ordered by PCP -Can consider trial of Gabapentin if no improvement -Follow up with PCP as scheduled on Thurs April 21      Carollee Leitz, MD [place]

## 2021-04-15 DIAGNOSIS — T148XXA Other injury of unspecified body region, initial encounter: Secondary | ICD-10-CM | POA: Insufficient documentation

## 2021-04-15 NOTE — Assessment & Plan Note (Addendum)
Symptoms likely secondary to muscle strain and inflammation -Oxycontin IR 5 mg q6h prn x 2 days -ROM exercises provided -Consider Physical therapy, patient will let me know if wanting referral -Continue Tizanidine as previously ordered by PCP -Can consider trial of Gabapentin if no improvement -Follow up with PCP as scheduled on Thurs April 21

## 2021-04-16 ENCOUNTER — Ambulatory Visit (INDEPENDENT_AMBULATORY_CARE_PROVIDER_SITE_OTHER): Payer: 59 | Admitting: Family Medicine

## 2021-04-16 ENCOUNTER — Encounter: Payer: Self-pay | Admitting: Family Medicine

## 2021-04-16 ENCOUNTER — Other Ambulatory Visit: Payer: Self-pay

## 2021-04-16 VITALS — BP 140/94 | HR 79 | Wt 150.1 lb

## 2021-04-16 DIAGNOSIS — E1143 Type 2 diabetes mellitus with diabetic autonomic (poly)neuropathy: Secondary | ICD-10-CM | POA: Diagnosis not present

## 2021-04-16 DIAGNOSIS — L989 Disorder of the skin and subcutaneous tissue, unspecified: Secondary | ICD-10-CM

## 2021-04-16 DIAGNOSIS — R1115 Cyclical vomiting syndrome unrelated to migraine: Secondary | ICD-10-CM

## 2021-04-16 DIAGNOSIS — L281 Prurigo nodularis: Secondary | ICD-10-CM

## 2021-04-16 DIAGNOSIS — K3184 Gastroparesis: Secondary | ICD-10-CM

## 2021-04-16 DIAGNOSIS — J301 Allergic rhinitis due to pollen: Secondary | ICD-10-CM

## 2021-04-16 HISTORY — DX: Prurigo nodularis: L28.1

## 2021-04-16 MED ORDER — OXYCODONE HCL 5 MG PO TABS: 5.0000 mg | ORAL_TABLET | Freq: Four times a day (QID) | ORAL | 0 refills | Status: DC | PRN

## 2021-04-16 MED ORDER — FLUTICASONE PROPIONATE 50 MCG/ACT NA SUSP: 2.0000 | Freq: Every day | NASAL | 6 refills | Status: DC

## 2021-04-16 MED ORDER — LORAZEPAM 1 MG PO TABS
1.0000 mg | ORAL_TABLET | Freq: Four times a day (QID) | ORAL | 0 refills | Status: DC | PRN
Start: 2021-04-16 — End: 2021-05-14

## 2021-04-16 NOTE — Patient Instructions (Signed)
Vomiting, Adult Vomiting occurs when stomach contents are thrown up and out of the mouth. Many people notice nausea before vomiting. Vomiting can make you feel weak and cause you to become dehydrated. Dehydration can make you feel tired and thirsty, cause you to have a dry mouth, and decrease how often you urinate. Older adults and people who have other diseases or a weak body defense system (immune system) are at higher risk for dehydration. It is important to treat vomiting as told by your health care provider. Follow these instructions at home: Eating and drinking Follow these recommendations as told by your health care provider:  Take an oral rehydration solution (ORS). This is a drink that is sold at pharmacies and retail stores.  Eat bland, easy-to-digest foods in small amounts as you are able. These foods include bananas, applesauce, rice, lean meats, toast, and crackers.  Drink clear fluids slowly and in small amounts as you are able. Clear fluids include water, ice chips, low-calorie sports drinks, and fruit juice that has water added (diluted fruit juice).  Avoid drinking fluids that contain a lot of sugar or caffeine, such as energy drinks, sports drinks, and soda.  Avoid alcohol.  Avoid spicy or fatty foods.      General instructions  Wash your hands often using soap and water. If soap and water are not available, use hand sanitizer. Make sure that everyone in your household washes their hands frequently.  Take over-the-counter and prescription medicines only as told by your health care provider.  Rest at home while you recover.  Watch your condition for any changes.  Keep all follow-up visits as told by your health care provider. This is important.   Contact a health care provider if:  Your vomiting gets worse.  You have new symptoms.  You have a fever.  You cannot drink fluids without vomiting.  You feel light-headed or dizzy.  You have a headache.  You have  muscle cramps.  You have a rash.  You have pain while urinating. Get help right away if:  You have pain in your chest, neck, arm, or jaw.  You feel extremely weak or you faint.  You have persistent vomiting.  You have vomit that is bright red or looks like black coffee grounds.  You have stools that are bloody or black, or stools that look like tar.  You have a severe headache, a stiff neck, or both.  You have severe pain, cramping, or bloating in your abdomen.  You have trouble breathing or you are breathing very quickly.  Your heart is beating very quickly.  Your skin feels cold and clammy.  You feel confused.  You have signs of dehydration, such as: ? Dark urine, very little urine, or no urine. ? Cracked lips. ? Dry mouth. ? Sunken eyes. ? Sleepiness. ? Weakness. These symptoms may represent a serious problem that is an emergency. Do not wait to see if the symptoms will go away. Get medical help right away. Call your local emergency services (911 in the [place] Do not drive yourself to the hospital. Summary  Vomiting occurs when stomach contents are thrown up and out of the mouth. Vomiting can cause you to become dehydrated. Older adults and people who have other diseases or a weak immune system are at higher risk for dehydration.  It is important to treat vomiting as told by your health care provider. Follow your health care provider's instructions about eating and drinking.  Wash your hands often   using soap and water. If soap and water are not available, use hand sanitizer. Make sure that everyone in your household washes their hands frequently.  Watch your condition for any changes and for signs of dehydration.  Keep all follow-up visits as told by your health care provider. This is important. This information is not intended to replace advice given to you by your health care provider. Make sure you discuss any questions you have with your health care  provider. Document Revised: 06/01/2019 Document Reviewed: 05/23/2018 Elsevier Patient Education  2021 [employer] Inc.  

## 2021-04-17 ENCOUNTER — Encounter: Payer: Self-pay | Admitting: Family Medicine

## 2021-04-17 NOTE — Assessment & Plan Note (Signed)
Established problem worsened.  Recrudesence of patient's chronic cyclic vomiting syndrome/diabetic gastroparesis History elements: Onset / Duration: yesterday Quality: Acute; aching, cramping, similar to prior episodes, uncomfortable  Location: abdomen, epigastric Severity: moderate Timing: constant Course: acute and recurrent Modifying factors / Treatment prior to visit: unable to keep down lorazepam.   Unable to check CBGs because of inability to use new glucometer.  Denies her usual feelings of hypoglycemia.  Context: Associated: NP cough, nausea & vomiting/retching, unable keep down food Similar to her multiple prior CVS/gastroparesis exacerbations, no fever, no recent URI sysmptoms UTD Covid Vaccinations and booster x 1  PE Gen: alert, moderate distress, emesis bag at hand, laying on exam table with eyes closed,  tearful but resolved by end of visit.  Walked to exit without difficulty or delay at end of visit. Distress appeared alleviated by end of visit.  HEENT: moist mucus membranes, tears,  Ext: cap refill<2s  Lung: BCTA Cor RRR, no m Abdomin: soft, flat, mild epigatric tenderness that was ditractable, (+) bowel sounds, no rebound or guarding, no RUQ tenderness, No RLQ tenderness  Assessment and Plan No indication of appendicitis, bowel obstruction, bowel perforation, cholecystitis, diverticulitis, PID or ectopic pregnancy. A Working explanation is exacerbation of her chronic CVS/gastroparesis.  Appears adequately hydrated    Refill Ativan/oxycodone for abortive therapy.  patient has phenergan suppository available medications Instuctions on maintenance of hydration given Retrun precautions given.  Patient to return in 2 weeks to assess DMT2 and HTN

## 2021-04-17 NOTE — Assessment & Plan Note (Signed)
refvisit need for Seroquel at next ov

## 2021-04-17 NOTE — Progress Notes (Signed)
[name] [name] is alone Sources of clinical information for visit is/are patient and past medical records. Nursing assessment for this office visit was reviewed with the patient for accuracy and revision.     Previous Report(s) Reviewed: office notes  Depression screen [facility] 2/9 04/16/2021  Decreased Interest 0  Down, Depressed, Hopeless 0  PHQ - 2 Score 0  Altered sleeping 0  Tired, decreased energy 0  Change in appetite 0  Feeling bad or failure about yourself  0  Trouble concentrating 0  Moving slowly or fidgety/restless 0  Suicidal thoughts 0  PHQ-9 Score 0  Difficult doing work/chores -  Some recent data might be hidden    Fall Risk  06/05/2020 11/16/2018 07/28/2018 01/05/2018 12/09/2017  Falls in the past year? 1 0 No No No  Number falls in past yr: 0 0 - - -  Injury with Fall? 1 0 - - -  Risk for fall due to : - - - - -  Risk for fall due to: Comment - - - - -    PHQ9 SCORE ONLY 04/16/2021 02/10/2021 01/22/2021  PHQ-9 Total Score 0 7 7    Adult vaccines due  Topic Date Due  . TETANUS/TDAP  09/29/2023  . PNEUMOCOCCAL POLYSACCHARIDE VACCINE AGE 46-64 HIGH RISK  Completed    Health Maintenance Due  Topic Date Due  . Hepatitis C Screening  Never done      History/P.E. limitations: due to condition  Adult vaccines due  Topic Date Due  . TETANUS/TDAP  09/29/2023  . PNEUMOCOCCAL POLYSACCHARIDE VACCINE AGE 46-64 HIGH RISK  Completed    Diabetes Health Maintenance Due  Topic Date Due  . HEMOGLOBIN A1C  06/22/2021  . FOOT EXAM  07/10/2021  . OPHTHALMOLOGY EXAM  01/06/2022    Health Maintenance Due  Topic Date Due  . Hepatitis C Screening  Never done     Chief Complaint  Patient presents with  . Abdominal Pain

## 2021-04-22 ENCOUNTER — Other Ambulatory Visit: Payer: Self-pay | Admitting: Family Medicine

## 2021-04-22 DIAGNOSIS — M545 Low back pain, unspecified: Secondary | ICD-10-CM

## 2021-04-22 DIAGNOSIS — G8929 Other chronic pain: Secondary | ICD-10-CM

## 2021-04-30 ENCOUNTER — Encounter: Payer: Self-pay | Admitting: Family Medicine

## 2021-04-30 ENCOUNTER — Telehealth (INDEPENDENT_AMBULATORY_CARE_PROVIDER_SITE_OTHER): Payer: 59

## 2021-04-30 ENCOUNTER — Ambulatory Visit: Payer: 59 | Admitting: Family Medicine

## 2021-04-30 DIAGNOSIS — K3184 Gastroparesis: Secondary | ICD-10-CM

## 2021-04-30 DIAGNOSIS — L2089 Other atopic dermatitis: Secondary | ICD-10-CM | POA: Insufficient documentation

## 2021-04-30 DIAGNOSIS — E1143 Type 2 diabetes mellitus with diabetic autonomic (poly)neuropathy: Secondary | ICD-10-CM | POA: Diagnosis not present

## 2021-04-30 DIAGNOSIS — R1115 Cyclical vomiting syndrome unrelated to migraine: Secondary | ICD-10-CM

## 2021-04-30 DIAGNOSIS — L309 Dermatitis, unspecified: Secondary | ICD-10-CM | POA: Insufficient documentation

## 2021-04-30 NOTE — Telephone Encounter (Signed)
Provider is aware. Provider will call.

## 2021-04-30 NOTE — Telephone Encounter (Signed)
[place] / E- Visit    Patient's identity was confirmed using her Full Name and DOB.   We discussed with the patient that there may be a patient responsible charge related to this service.  The patient expressed understanding and agreed to proceed.   Encounter participants: Patient: [name] [name]  Patient Location: Home Provider: Sherren Mocha Montie Swiderski at office  Others (if applicable): none   Chief Complaint: Nausea & Vomiting  HPI: Vomiting  Onset: today SEEN PREVIOUSLY FOR THIS?: multiple, this is a longstanding diagnosis. Last seen 3 month ago by me  Frequency: multiple episodes of vomiting today, starting when she awoke.      Unable to keep down her abortive therapy of lorazepam/ oxycodone/phenergan PO Eating/Drinking: Last successful meal and/or drink: Last night    Worse with eating/drinking: worse  Symptoms  Abdominal Pain:  yes, general pain after retching  Vomiting of food eaten several hours earlier: no  Headache: no    Diarrhea: no  Fever/Chills: no  EtOH use: no  Illicit drug use yes, marijuana   Past Surgeries: S/P hysterectomy, C-section, Ex Lap with lysis of adhesions Relevant PMH: Diabetic gastroparesis, Cyclic Vomiting  Home blood sugars: not checking   Exam:  Respiratory: speaking in full sentence, Speech and language clear and coherent  Assessment/Plan:  Working Explanation of intractable nausea and vomiting, acute exacerbation of gastroparesis/Cyclic vomiting  Re-attempt home abortive therapy - Attempt use of lorazepam tab SL - Use Phenergan Suppository - Once nausea subsides, take the oxycodone - If continue to be unable to keep down meds and fluids, then go to the ED for evaluation and treatment - Contact our office in morning to let us know how she is doing.   Time of telephone visit was 12 minutes

## 2021-04-30 NOTE — Telephone Encounter (Signed)
Pt called requested for Dr. McDiarmid to call her asap! She's having another episode and won't be able to make it into her appointment this morning.

## 2021-05-07 ENCOUNTER — Other Ambulatory Visit: Payer: Self-pay | Admitting: Family Medicine

## 2021-05-07 DIAGNOSIS — E11311 Type 2 diabetes mellitus with unspecified diabetic retinopathy with macular edema: Secondary | ICD-10-CM

## 2021-05-07 DIAGNOSIS — E1143 Type 2 diabetes mellitus with diabetic autonomic (poly)neuropathy: Secondary | ICD-10-CM

## 2021-05-07 DIAGNOSIS — N184 Chronic kidney disease, stage 4 (severe): Secondary | ICD-10-CM

## 2021-05-14 ENCOUNTER — Other Ambulatory Visit: Payer: Self-pay | Admitting: Family Medicine

## 2021-05-14 ENCOUNTER — Other Ambulatory Visit: Payer: Self-pay

## 2021-05-14 ENCOUNTER — Encounter: Payer: Self-pay | Admitting: Family Medicine

## 2021-05-14 ENCOUNTER — Ambulatory Visit (INDEPENDENT_AMBULATORY_CARE_PROVIDER_SITE_OTHER): Payer: 59 | Admitting: Family Medicine

## 2021-05-14 VITALS — BP 200/80 | HR 78 | Ht 62.0 in | Wt 148.0 lb

## 2021-05-14 DIAGNOSIS — R1115 Cyclical vomiting syndrome unrelated to migraine: Secondary | ICD-10-CM

## 2021-05-14 DIAGNOSIS — I10 Essential (primary) hypertension: Secondary | ICD-10-CM

## 2021-05-14 DIAGNOSIS — E11311 Type 2 diabetes mellitus with unspecified diabetic retinopathy with macular edema: Secondary | ICD-10-CM

## 2021-05-14 DIAGNOSIS — E1142 Type 2 diabetes mellitus with diabetic polyneuropathy: Secondary | ICD-10-CM

## 2021-05-14 DIAGNOSIS — Z95828 Presence of other vascular implants and grafts: Secondary | ICD-10-CM

## 2021-05-14 DIAGNOSIS — E1169 Type 2 diabetes mellitus with other specified complication: Secondary | ICD-10-CM | POA: Diagnosis not present

## 2021-05-14 DIAGNOSIS — E118 Type 2 diabetes mellitus with unspecified complications: Secondary | ICD-10-CM

## 2021-05-14 DIAGNOSIS — E1143 Type 2 diabetes mellitus with diabetic autonomic (poly)neuropathy: Secondary | ICD-10-CM

## 2021-05-14 DIAGNOSIS — E785 Hyperlipidemia, unspecified: Secondary | ICD-10-CM | POA: Diagnosis not present

## 2021-05-14 DIAGNOSIS — F39 Unspecified mood [affective] disorder: Secondary | ICD-10-CM

## 2021-05-14 DIAGNOSIS — L989 Disorder of the skin and subcutaneous tissue, unspecified: Secondary | ICD-10-CM

## 2021-05-14 DIAGNOSIS — K3184 Gastroparesis: Secondary | ICD-10-CM

## 2021-05-14 DIAGNOSIS — I152 Hypertension secondary to endocrine disorders: Secondary | ICD-10-CM

## 2021-05-14 DIAGNOSIS — E1159 Type 2 diabetes mellitus with other circulatory complications: Secondary | ICD-10-CM

## 2021-05-14 LAB — POCT GLYCOSYLATED HEMOGLOBIN (HGB A1C): HbA1c, POC (controlled diabetic range): 7.1 % — AB (ref 0.0–7.0)

## 2021-05-14 MED ORDER — CARVEDILOL 12.5 MG PO TABS: 12.5000 mg | ORAL_TABLET | Freq: Two times a day (BID) | ORAL | 3 refills | Status: DC

## 2021-05-14 MED ORDER — OXYCODONE HCL 5 MG PO TABS
5.0000 mg | ORAL_TABLET | Freq: Four times a day (QID) | ORAL | 0 refills | Status: DC | PRN
Start: 2021-05-14 — End: 2021-06-11

## 2021-05-14 MED ORDER — LORAZEPAM 1 MG PO TABS: 1.0000 mg | ORAL_TABLET | Freq: Four times a day (QID) | ORAL | 0 refills | Status: DC | PRN

## 2021-05-14 MED ORDER — QUETIAPINE FUMARATE 50 MG PO TABS: 50.0000 mg | ORAL_TABLET | Freq: Every day | ORAL | 1 refills | Status: DC

## 2021-05-14 NOTE — Patient Instructions (Addendum)
Your A1c is very good at 7.1%. Keep taking your insulin and ozempic.  Change to carvedilol 12.5 mg tablet, take one tablet twice a day. Come in next week for a Blood pressure check.    If you have not heard from [facility] by next Thursday, please let our office know.

## 2021-05-15 ENCOUNTER — Telehealth: Payer: Self-pay

## 2021-05-15 ENCOUNTER — Encounter: Payer: Self-pay | Admitting: Family Medicine

## 2021-05-15 NOTE — Assessment & Plan Note (Addendum)
Established problem Uncontrolled Start: Increase Carvedilol to 12.5 mg twice a day.  Ms Rhude is resistant to increasing the diltiazem bc it upsets her stomach.  Continue: Diltiazem CD 180 at bedtime  RTC for Nursing visit early next week to recheck BP Options: hydralazine, alpha-blocker, clonidine

## 2021-05-15 NOTE — Telephone Encounter (Signed)
Patient calls nurse line requesting nova fine 32G pen needles to be sent to her pharmacy. Patient reports these feel better to her verses the ones she has been getting. Please advise.

## 2021-05-15 NOTE — Assessment & Plan Note (Signed)
Established problem Well Controlled. No signs of complications, medication side effects, or red flags. Tactile hallucinations of  parasitosis persists. Recommend continuing on Seroquel 50 mg at bedtime - she is unable to tolerate higher dose because of GI upset

## 2021-05-15 NOTE — Progress Notes (Signed)
[name] [name] is alone Sources of clinical information for visit is/are patient and past medical records. Nursing assessment for this office visit was reviewed with the patient for accuracy and revision.     Previous Report(s) Reviewed: office notes and derm consult note  Depression screen PHQ 2/9 05/14/2021  Decreased Interest 1  Down, Depressed, Hopeless 1  PHQ - 2 Score 2  Altered sleeping 2  Tired, decreased energy 1  Change in appetite 3  Feeling bad or failure about yourself  -  Trouble concentrating 2  Moving slowly or fidgety/restless 3  Suicidal thoughts -  PHQ-9 Score 13  Some recent data might be hidden    Fall Risk  06/05/2020 11/16/2018 07/28/2018 01/05/2018 12/09/2017  Falls in the past year? 1 0 No No No  Number falls in past yr: 0 0 - - -  Injury with Fall? 1 0 - - -  Risk for fall due to : - - - - -  Risk for fall due to: Comment - - - - -    PHQ9 SCORE ONLY 05/14/2021 04/16/2021 02/10/2021  PHQ-9 Total Score 13 0 7    Adult vaccines due  Topic Date Due  . TETANUS/TDAP  09/29/2023  . PNEUMOCOCCAL POLYSACCHARIDE VACCINE AGE 48-64 HIGH RISK  Completed    Health Maintenance Due  Topic Date Due  . Hepatitis C Screening  Never done  . COVID-19 Vaccine (4 - Booster for [employer] series) 03/21/2021      History/P.E. limitations: none  Adult vaccines due  Topic Date Due  . TETANUS/TDAP  09/29/2023  . PNEUMOCOCCAL POLYSACCHARIDE VACCINE AGE 48-64 HIGH RISK  Completed    Diabetes Health Maintenance Due  Topic Date Due  . FOOT EXAM  07/10/2021  . HEMOGLOBIN A1C  11/14/2021  . OPHTHALMOLOGY EXAM  01/06/2022    Health Maintenance Due  Topic Date Due  . Hepatitis C Screening  Never done  . COVID-19 Vaccine (4 - Booster for [place] series) 03/21/2021     Chief Complaint  Patient presents with  . Diabetes

## 2021-05-15 NOTE — Assessment & Plan Note (Signed)
Established problem Uncontrolled Persisting tactile hallucination of parasitosis 2-3 mm sites on right anterior thigh Multiple areas of post-inflammatory hyperpigmentation areas bilateral scapula in patterns consistent with excoriation Patient brought in a 2-3 mm coiled thread-like or hair-like thing that she perceived of as a bug or insect.   My first guess is it is a pubic hair.  The excoriated areas without evidence of skin or soft tissue infection. [name] [name] is resistant to increasing her Seroquel to 100 mg ahs, but is willing to continue taking 50 mg at bedtime.  She is working with Dr Denna Haggard for

## 2021-05-18 ENCOUNTER — Ambulatory Visit (INDEPENDENT_AMBULATORY_CARE_PROVIDER_SITE_OTHER): Payer: 59

## 2021-05-18 ENCOUNTER — Other Ambulatory Visit: Payer: Self-pay

## 2021-05-18 VITALS — BP 161/69 | HR 55

## 2021-05-18 DIAGNOSIS — Z0130 Encounter for examination of blood pressure without abnormal findings: Secondary | ICD-10-CM

## 2021-05-18 NOTE — Progress Notes (Signed)
Patient here today for BP check.      Last BP was on 05/14/2021 and was 200/80.  BP today is 161/69 with a pulse of 55.    Checked BP in right arm with normal adult cuff.    Symptoms present: none.   Patient has been taking all medications as prescribed.  Precepted with Dr. McDiarmid. No new changes to medications or BP management plan. Provided patient with return precautions. Patient verbalizes understanding.    Routed note to PCP.         Talbot Grumbling, RN

## 2021-05-22 ENCOUNTER — Other Ambulatory Visit: Payer: Self-pay | Admitting: Family Medicine

## 2021-05-22 DIAGNOSIS — M545 Low back pain, unspecified: Secondary | ICD-10-CM

## 2021-05-22 DIAGNOSIS — G8929 Other chronic pain: Secondary | ICD-10-CM

## 2021-06-11 ENCOUNTER — Other Ambulatory Visit: Payer: Self-pay

## 2021-06-11 ENCOUNTER — Emergency Department (HOSPITAL_COMMUNITY)
Admission: EM | Admit: 2021-06-11 | Discharge: 2021-06-11 | Disposition: A | Discharge status: Home / Self Care | Payer: 59 | Attending: Emergency Medicine | Admitting: Emergency Medicine

## 2021-06-11 ENCOUNTER — Encounter (HOSPITAL_COMMUNITY): Payer: Self-pay | Admitting: Emergency Medicine

## 2021-06-11 ENCOUNTER — Emergency Department (HOSPITAL_COMMUNITY): Payer: 59

## 2021-06-11 DIAGNOSIS — E1110 Type 2 diabetes mellitus with ketoacidosis without coma: Secondary | ICD-10-CM | POA: Diagnosis not present

## 2021-06-11 DIAGNOSIS — E1140 Type 2 diabetes mellitus with diabetic neuropathy, unspecified: Secondary | ICD-10-CM | POA: Diagnosis not present

## 2021-06-11 DIAGNOSIS — E1122 Type 2 diabetes mellitus with diabetic chronic kidney disease: Secondary | ICD-10-CM | POA: Diagnosis not present

## 2021-06-11 DIAGNOSIS — R1115 Cyclical vomiting syndrome unrelated to migraine: Secondary | ICD-10-CM | POA: Diagnosis not present

## 2021-06-11 DIAGNOSIS — O009 Unspecified ectopic pregnancy without intrauterine pregnancy: Secondary | ICD-10-CM | POA: Insufficient documentation

## 2021-06-11 DIAGNOSIS — R1084 Generalized abdominal pain: Secondary | ICD-10-CM | POA: Insufficient documentation

## 2021-06-11 DIAGNOSIS — Z87891 Personal history of nicotine dependence: Secondary | ICD-10-CM | POA: Diagnosis not present

## 2021-06-11 DIAGNOSIS — K219 Gastro-esophageal reflux disease without esophagitis: Secondary | ICD-10-CM | POA: Insufficient documentation

## 2021-06-11 DIAGNOSIS — Z794 Long term (current) use of insulin: Secondary | ICD-10-CM | POA: Insufficient documentation

## 2021-06-11 DIAGNOSIS — Z79899 Other long term (current) drug therapy: Secondary | ICD-10-CM | POA: Diagnosis not present

## 2021-06-11 DIAGNOSIS — N183 Chronic kidney disease, stage 3 unspecified: Secondary | ICD-10-CM | POA: Insufficient documentation

## 2021-06-11 DIAGNOSIS — E1143 Type 2 diabetes mellitus with diabetic autonomic (poly)neuropathy: Secondary | ICD-10-CM

## 2021-06-11 DIAGNOSIS — I129 Hypertensive chronic kidney disease with stage 1 through stage 4 chronic kidney disease, or unspecified chronic kidney disease: Secondary | ICD-10-CM | POA: Diagnosis not present

## 2021-06-11 LAB — COMPREHENSIVE METABOLIC PANEL
ALT: 13 U/L (ref 0–44)
AST: 18 U/L (ref 15–41)
Albumin: 4.3 g/dL (ref 3.5–5.0)
Alkaline Phosphatase: 74 U/L (ref 38–126)
Anion gap: 9 (ref 5–15)
BUN: 37 mg/dL — ABNORMAL HIGH (ref 6–20)
CO2: 24 mmol/L (ref 22–32)
Calcium: 10.1 mg/dL (ref 8.9–10.3)
Chloride: 108 mmol/L (ref 98–111)
Creatinine, Ser: 2.8 mg/dL — ABNORMAL HIGH (ref 0.44–1.00)
GFR, Estimated: 19 mL/min — ABNORMAL LOW (ref >60)
Glucose, Bld: 201 mg/dL — ABNORMAL HIGH (ref 70–99)
Potassium: 4.9 mmol/L (ref 3.5–5.1)
Sodium: 141 mmol/L (ref 135–145)
Total Bilirubin: 0.9 mg/dL (ref 0.3–1.2)
Total Protein: 7.8 g/dL (ref 6.5–8.1)

## 2021-06-11 LAB — CBC
HCT: 37.2 % (ref 36.0–46.0)
Hemoglobin: 12.1 g/dL (ref 12.0–15.0)
MCH: 30.9 pg (ref 26.0–34.0)
MCHC: 32.5 g/dL (ref 30.0–36.0)
MCV: 95.1 fL (ref 80.0–100.0)
Platelets: 265 10*3/uL (ref 150–400)
RBC: 3.91 MIL/uL (ref 3.87–5.11)
RDW: 14.7 % (ref 11.5–15.5)
WBC: 6.7 10*3/uL (ref 4.0–10.5)
nRBC: 0 % (ref 0.0–0.2)

## 2021-06-11 LAB — URINALYSIS, ROUTINE W REFLEX MICROSCOPIC
Bilirubin Urine: NEGATIVE
Glucose, UA: 50 mg/dL — AB
Hgb urine dipstick: NEGATIVE
Ketones, ur: 5 mg/dL — AB
Leukocytes,Ua: NEGATIVE
Nitrite: NEGATIVE
Protein, ur: 100 mg/dL — AB
Specific Gravity, Urine: 1.013 (ref 1.005–1.030)
pH: 6 (ref 5.0–8.0)

## 2021-06-11 LAB — I-STAT BETA HCG BLOOD, ED (MC, WL, AP ONLY): I-stat hCG, quantitative: 12.6 m[IU]/mL — ABNORMAL HIGH (ref <5)

## 2021-06-11 LAB — LIPASE, BLOOD: Lipase: 150 U/L — ABNORMAL HIGH (ref 11–51)

## 2021-06-11 MED ORDER — LORAZEPAM 1 MG PO TABS: 1.0000 mg | ORAL_TABLET | Freq: Four times a day (QID) | ORAL | 0 refills | Status: DC | PRN

## 2021-06-11 MED ORDER — FENTANYL CITRATE (PF) 100 MCG/2ML IJ SOLN: 50.0000 ug | Freq: Once | INTRAMUSCULAR | Status: DC

## 2021-06-11 MED ORDER — HYDROMORPHONE HCL 1 MG/ML IJ SOLN
1.0000 mg | Freq: Once | INTRAMUSCULAR | Status: AC
  Administered 2021-06-11: 1 mg via INTRAVENOUS
  Filled 2021-06-11: qty 1

## 2021-06-11 MED ORDER — LORAZEPAM 2 MG/ML IJ SOLN
2.0000 mg | Freq: Once | INTRAMUSCULAR | Status: AC
  Administered 2021-06-11: 2 mg via INTRAVENOUS
  Filled 2021-06-11: qty 1

## 2021-06-11 MED ORDER — SODIUM CHLORIDE 0.9 % IV SOLN
25.0000 mg | Freq: Four times a day (QID) | INTRAVENOUS | Status: DC | PRN
  Administered 2021-06-11: 25 mg via INTRAVENOUS
  Filled 2021-06-11: qty 1

## 2021-06-11 MED ORDER — CAPSAICIN 0.025 % EX CREA
TOPICAL_CREAM | Freq: Once | CUTANEOUS | Status: DC
  Filled 2021-06-11: qty 60

## 2021-06-11 MED ORDER — ONDANSETRON 4 MG PO TBDP: 4.0000 mg | ORAL_TABLET | Freq: Once | ORAL | Status: DC | PRN

## 2021-06-11 MED ORDER — OXYCODONE HCL 5 MG PO TABS: 5.0000 mg | ORAL_TABLET | Freq: Four times a day (QID) | ORAL | 0 refills | Status: DC | PRN

## 2021-06-11 NOTE — ED Notes (Signed)
Patient transported to CT 

## 2021-06-11 NOTE — ED Notes (Signed)
Offered to give pt zofran, but she states it makes her throw up.

## 2021-06-11 NOTE — Telephone Encounter (Signed)
Patient calls nurse line requesting refills to be sent in for lorazepam and oxycodone tomorrow. Patient is not due for refill until Sunday, 6/19. However, patient reports that she is getting labs drawn tomorrow and would like to be able to pick up medication while she is in the area, due to high gas prices and not having AC in her car.   Please advise.   Talbot Grumbling, RN

## 2021-06-11 NOTE — ED Provider Notes (Signed)
[facility] EMERGENCY DEPARTMENT Provider Note   CSN: [id number] Arrival date & time: 06/11/21  2951     History Chief Complaint  Patient presents with   Emesis   Abdominal Pain    [name] [name] is a 55 y.o. female.  HPI  Patient with a history of gastroparesis and cyclic vomiting syndrome presents for 2 days of vomiting.  States she has diffuse abdominal pain, more than 5 episodes of vomiting a day, nausea.  States nothing is helped, and that she usually has a preset cocktail that helps resolve the pain.  States her last time taking oxycodone was 2 days ago. She denies marijuana use. S/P cholecystectomy.   Past Medical History:  Diagnosis Date   Abdominal pain, generalized    Acquired bilateral hammer toes 01/20/2017   Acute post-traumatic stress disorder 11/17/2018   AKI (acute kidney injury) ([place])    Allergy    Anemia    Anxiety    Arthritis    At risk for polypharmacy 08/15/2014   Biceps tendinopathy of right upper extremity 04/05/2014   Blood transfusion 2003   Cataract    both eyes   Chronic generalized abdominal pain    Chronic kidney disease (CKD), stage III (moderate) (HCC)    Chronic leg cramping 07/18/2014   CKD (chronic kidney disease) 08/17/2013   Seen by Dr Joelyn Oms 07/28/15 [facility]) - (see scanned document): 20 years diabetes, 15 years HTN, stable CKD stage III with proteinuria.  - CKD, discussed NSAID avoidance, diabetes control with target A1c<7.5%, and HTN control with SBP<140. F/u 6 months and repeat renal panel at that time. - Microalbuminuria: On lisinopril 72m, suspected secondary due to diabetic nephropathy. - Chronic back pain: No NSDAIDs. - Say amlodipine causes nausea  - Advised not to ACE-i during vomiting episodes     Complication of anesthesia    "problems waking up"   Cyclic vomiting syndrome    Delayed gastric emptying 05/12/2011     04/12/11  NUCLEAR MEDICINE GASTRIC EMPTYING SCAN  Radiopharmaceutical: 2.0 mCi  [id number] colloid.  Comparison:  None.  Findings: At 1 hour, 81% of the counts remain in the stomach.  At 2 hours, 53% remain.  Normal is less than 30% at 2 hours.  IMPRESSION: Moderate delay in gastric emptying.       Depression    Depression with anxiety 02/23/2007   See [facility] Note dated 05/06/2016.  IC (KGwenlyn Saranwas called in and I did a brief assessment.    Diabetic gastroparesis ([place]   /e-chart   Diabetic ketoacidosis without coma associated with type 2 diabetes mellitus ([place]   DKA (diabetic ketoacidoses) 08/03/2016   Elevated cholesterol 11/28/2015   Emotional stress 12/30/2015   Stress after Breakup with partnes of 13 years on 12/27/2015    Encounter for chronic pain management 01/06/2015   Indication for chronic opioid: Chronic back pain, cyclic vomiting Medication and dose: oxycodone 65m23m# pills per month: 15 Last UDS date: 05/08/2015 - result pending Pain contract signed (Y/N): Yes, reviewed 01/06/2015  Date narcotic database last reviewed (include red flags): 05/08/2015 (no red flags).     Erosive esophagitis 08/06/2019   Esophagitis 04/18/2013   Documented by Dr. PerHenrene PastorD 03/2011  He also consulted on her during hospitalization 03/88/4166r cyclical vomiting. No repeat EGD necessary, treat esophagitis with PPI.     Esophagitis determined by endoscopy 04/18/2013   Documented by Dr. PerHenrene PastorD 03/2011  He also consulted  on her during hospitalization 23/7628 for cyclical vomiting. No repeat EGD necessary, treat esophagitis with PPI.     Essential hypertension, malignant 02/23/2007   Jan 2015 - stopped recently-started norvasc due to reports of GI symptoms. 02/2015 - holding lisinopril, adding hydralazine 56m TID, and will take 1/2 tab lisinopril when feeling better when not in cyclic vomiting flare    GERD (gastroesophageal reflux disease)    Grief reaction 10/16/2018   Heart murmur    Hematuria 07/09/2015   High risk social situation 08/31/2012   Hip pain, bilateral 03/31/2018   History of  colonic polyps 11/15/2018   10/2017 screening colonoscopy Dr JScarlette Shorts Seven polyps were found in the sigmoid colon, transverse colon, ascending colon and cecum. The polyps were 2 to 10 mm in size. These polyps were removed with a cold snare.    History of multiple colonic tubular adenomas 11/15/2018   10/2019 Surveillance colonoscopy Dr JScarlette Shortsfound five (1-3 mm) polyps that were removed by cold snares (polypectomies). Polyps in ascending colon and cecum. Histopathology:TUBULAR ADENOMA, NEGATIVE FOR HIGH GRADE DYSPLASIA (4).  Recommendation repeat surv. c-scopy in 3 yrs.  10/2017 screening colonoscopy Dr JScarlette Shorts Seven tubular adenomas were found in the sigmoid colon, transverse colon, a   Hot flashes 07/29/2011   Hyperlipidemia associated with type 2 diabetes mellitus ([place] 08/15/2014   Based on her choleserol, BP, smoker status, and that she is a diabetic, her 10 year ASCVD risk is 9.9% putting her in a category of risk that would benefit from high-intensity statin.     Hypertension    Incontinence 10/03/2017   INSOMNIA NOS 02/23/2007   Qualifier: Diagnosis of  By: HBeryle Lathe    Intermittent constipation 06/06/2015   Intestinal impaction ([place]    Intractable vomiting 07/02/2016   Marijuana smoker, continuous    pt. denies does smoke marijuana   MIGRAINE, UNSPEC., W/O INTRACTABLE MIGRAINE 02/23/2007   Qualifier: Diagnosis of  By: HBeryle Lathe    Myocardial infarction ([facility] 07/2007   "they say I've had a silent one; I don't know"   Nausea and vomiting in adult 06/06/2013   Neuromuscular disorder ([place]    neuropathy in both legs   NEUROPATHY, DIABETIC 02/23/2007   Nonspecific low back pain 04/10/2007   Back pain is chronic. Oxycodone per prior notes helps pt be more active. Reportedly uses 4 tab/week oxy for intermittent worsening of chronic back pain but mostly for cyclic vom to keep her out of hospital.     Normocytic anemia    Onychomycosis 03/25/2015   PANCREATITIS 05/09/2008    Pneumonia    Pre-ulcerative calluses 05/27/2017   Primary income source is SS Disability Income 03/10/2017   Disability SSI   Protein-calorie malnutrition, severe ([place] 12/17/2013   Prurigo nodularis 04/16/2021   Pure hypercholesterolemia 08/15/2014   Based on her choleserol, BP, smoker status, and that she is a diabetic, her 10 year ASCVD risk is 9.9% putting her in a category of risk that would benefit from high-intensity statin.     Pyelonephritis 06/09/2015   stranding about kidney on CT AP    Recurrent major depressive disorder ([place]    Rotator cuff syndrome 04/19/2014   rt. shoulder   Rotator cuff tendonopathies with partial tear, right 03/31/2018   05/23/18 MRI: 1. Severe tendonosis of the supraspinatus tendon with a partial-thickness bursal surface tear.     2. Mild tendinosis of the infraspinatus tendon.     3. Mild tendinosis of the subscapularis tendon  Smoker    Stye external 09/02/2017   TOBACCO DEPENDENCE 02/23/2007   2 cigarettes every few days 11/2012 3 cigarettes daily     Type II diabetes mellitus ([place])    Uncontrolled secondary diabetes mellitus with stage 3 CKD (GFR 30-59) (HCC) 10/23/2011   Nephropathy, gastroparesis  HgbA1c (06/2011) 7.6, 9.5, 12.2, 8.2, 10.9 (07/2012), 9.2 (02/11/2013), 7.7 (09/27/13)  Metformin: she has a history of cyclical vomiting; she would prefer not to take this medication  On lanuts 20U qam with improved control. 09/27/13    Vitamin D deficiency 11/28/2014    Patient Active Problem List   Diagnosis Date Noted   Eczema 04/30/2021   Prurigo nodularis 04/16/2021   Muscle strain 04/15/2021   Neck pain 04/08/2021   Cannabis dependence, uncomplicated ([place]) 41/32/4401   Diabetic retinopathy, non-proliferative, of right eye with macular edema associated with type 2 diabetes mellitus ([place]) 12/23/2020   Mood disorder ([place])    Skin disorder 05/23/2020   Tubular adenoma of colon 12/03/2019   Continuous cannabis use 11/08/2019   Left ventricular diastolic  dysfunction with preserved systolic function 02/72/5366   Primary income source is SS Disability Income 03/10/2017   Port-A-Cath in place 01/20/2017   Secondary hyperparathyroidism of renal origin ([place]) 10/05/2016   Hypertension associated with diabetes ([place]) 05/01/2016   Diabetic gastroparesis associated with type 2 diabetes mellitus ([place]) 05/01/2016   Normocytic anemia    Intermittent constipation 06/06/2015   Encounter for monitoring opioid maintenance therapy 01/06/2015   Vitamin D deficiency 11/28/2014   Hyperlipidemia associated with type 2 diabetes mellitus ([place]) 08/15/2014   Chronic kidney disease (CKD) stage G4/A2, severely decreased glomerular filtration rate (GFR) between 15-29 mL/min/1.73 square meter and albuminuria creatinine ratio between 30-299 mg/g (HCC) 08/17/2013   Allergic rhinitis, seasonal 44/02/4741   Cyclic vomiting syndrome 08/03/2011   Chronic bilateral low back pain 04/10/2007   Anxiety disorder 02/23/2007   Tobacco abuse 02/23/2007   Chronic insomnia secondary to General Medical and Mood disorders 02/23/2007   Diabetic autonomic neuropathy ([place]) 02/23/2007   GERD with esophagitis 01/27/2004    Past Surgical History:  Procedure Laterality Date   ABDOMINAL HYSTERECTOMY  02/2002   CARDIAC CATHETERIZATION     CATARACT EXTRACTION W/ INTRAOCULAR LENS  IMPLANT, BILATERAL     CESAREAN SECTION     x 3   CHOLECYSTECTOMY  1980's   COLONOSCOPY     patient not sure   DILATION AND EVACUATION  X3   laparotomy and lysis of adhesions     left hand surgery     PORT-A-CATH placement Right 08/11/2005   Georgina Quint (Gen Surg)  "poor access; frequent hospitalizations"   SHOULDER ARTHROSCOPY Right 07/21/2019   Justice Britain (Ortho):  Arthroscopic rotator cuff repair; Arthroscopic distal clavicle resection; Arthroscopic subacromial decompression and bursectomy;  Debridement of degenerative labral tear   SMALL INTESTINE SURGERY     laporotomy with lysis of adhesions.    UPPER GI ENDOSCOPY       OB History     Gravida  7   Para      Term      Preterm      AB  3   Living  4      SAB      IAB  3   Ectopic      Multiple      Live Births              Family History  Problem Relation Age of Onset  Diabetes type II Mother    Hypertension Mother    Heart attack Mother    Other Mother        Leg amputation   Kidney disease Mother        Required dialysis 10 years   Diabetes type II Sister    Diabetes Sister    Hypertension Sister    Alcohol abuse Father    Diabetes Sister    Diabetes Sister    Cancer Sister 1       Brain   Colon cancer Neg Hx    Colon polyps Neg Hx    Esophageal cancer Neg Hx    Rectal cancer Neg Hx    Stomach cancer Neg Hx     Social History   Tobacco Use   Smoking status: Every Day    Packs/day: 0.50    Years: 22.00    Pack years: 11.00    Types: Cigarettes    Start date: 12/27/1982   Smokeless tobacco: Never   Tobacco comments:    working on quitting - Down to 2 cigs a day  Vaping Use   Vaping Use: Never used  Substance Use Topics   Alcohol use: Yes    Comment: occaisionally; pt reports drinking beer to help her when constipated   Drug use: Yes    Types: Marijuana    Comment: last use 2 joints on 11/19/19    Home Medications Prior to Admission medications   Medication Sig Start Date End Date Taking? Authorizing Provider  blood glucose meter kit and supplies KIT Dispense based on patient and insurance preference. Use up to four times daily as directed. (FOR ICD-9 250.00, 250.01). 08/28/20   McDiarmid, Blane Ohara, MD  carvedilol (COREG) 12.5 MG tablet Take 1 tablet (12.5 mg total) by mouth 2 (two) times daily with a meal. 05/14/21   McDiarmid, Blane Ohara, MD  diclofenac Sodium (VOLTAREN) 1 % GEL Apply 2 g topically 4 (four) times daily as needed (pain). 04/08/21   Carollee Leitz, MD  diltiazem (CARDIZEM CD) 180 MG 24 hr capsule Take 1 capsule (180 mg total) by mouth daily. 02/10/21   McDiarmid, Blane Ohara,  MD  fluticasone (FLONASE) 50 MCG/ACT nasal spray Place 2 sprays into both nostrils daily. 04/16/21   McDiarmid, Blane Ohara, MD  glucose blood (ONETOUCH ULTRA) test strip Please use to check blood sugar up to 4 times daily. 03/17/21   McDiarmid, Blane Ohara, MD  insulin glargine (LANTUS SOLOSTAR) 100 UNIT/ML Solostar Pen Inject 20 Units into the skin daily. 05/14/21 08/12/21  McDiarmid, Blane Ohara, MD  insulin lispro (HUMALOG) 100 UNIT/ML KwikPen Inject 2 units subcutaneous only after eating at least half of your dinner each day. Patient taking differently: Inject 2 Units into the skin See admin instructions. Inject 2 units subcutaneous daily only after eating at least half of your dinner. 07/10/20   McDiarmid, Blane Ohara, MD  Insulin Pen Needle (B-D UF III MINI PEN NEEDLES) 31G X 5 MM MISC [place] injection twice a day 02/10/21   McDiarmid, Blane Ohara, MD  Lancets [facility] ULTRASOFT) lancets Please use to check blood sugar up to four times daily. 03/17/21   McDiarmid, Blane Ohara, MD  LORazepam (ATIVAN) 1 MG tablet Take 1 tablet (1 mg total) by mouth every 6 (six) hours as needed for anxiety. 05/14/21   McDiarmid, Blane Ohara, MD  lubiprostone (AMITIZA) 24 MCG capsule TAKE 1 CAPSULE(24 MCG) BY MOUTH TWICE DAILY WITH A MEAL Patient taking differently: Take 24 mcg by  mouth 2 (two) times daily with a meal. 11/10/20   McDiarmid, Blane Ohara, MD  mupirocin ointment (BACTROBAN) 2 % Apply 1 application topically 2 (two) times daily. Patient not taking: Reported on 05/15/2021 02/09/21   Lavonna Monarch, MD  oxyCODONE (OXY IR/ROXICODONE) 5 MG immediate release tablet Take 1 tablet (5 mg total) by mouth every 6 (six) hours as needed for moderate pain or severe pain. 05/14/21 06/13/21  McDiarmid, Blane Ohara, MD  OZEMPIC, 0.25 OR 0.5 MG/DOSE, 2 MG/1.5ML SOPN INJECT 0.5MG INTO THE SKIN ONCE A WEEK 05/08/21   Hensel, Jamal Collin, MD  promethazine (PHENERGAN) 25 MG suppository Place 1 suppository (25 mg total) rectally every 6 (six) hours as needed for nausea or vomiting.  12/06/19   McDiarmid, Blane Ohara, MD  QUEtiapine (SEROQUEL) 50 MG tablet Take 1 tablet (50 mg total) by mouth at bedtime. 05/14/21   McDiarmid, Blane Ohara, MD  senna (SENOKOT) 8.6 MG TABS tablet Take 2 tablets (17.2 mg total) by mouth daily as needed for moderate constipation. Patient not taking: No sig reported 12/06/19   McDiarmid, Blane Ohara, MD  tiZANidine (ZANAFLEX) 2 MG tablet TAKE 1 TABLET(2 MG) BY MOUTH THREE TIMES DAILY 05/22/21   McDiarmid, Blane Ohara, MD  Vitamin D, Ergocalciferol, (DRISDOL) 1.25 MG (50000 UNIT) CAPS capsule Take 50,000 Units by mouth every Friday. Patient not taking: Reported on 05/15/2021 11/09/20   [provider]    Allergies    Bee venom, Tegaderm ag mesh [silver], Acetaminophen, Novolog [insulin aspart], Statins, Flexeril [cyclobenzaprine], Lyrica [pregabalin], Nsaids, Tape, Ceftriaxone, and Erythromycin  Review of Systems   Review of Systems  Constitutional:  Negative for appetite change, chills and fever.  HENT:  Negative for ear pain and sore throat.   Eyes:  Negative for pain and visual disturbance.  Respiratory:  Negative for cough and shortness of breath.   Cardiovascular:  Negative for chest pain and palpitations.  Gastrointestinal:  Positive for abdominal pain, nausea and vomiting. Negative for diarrhea.  Genitourinary:  Negative for dysuria, flank pain and hematuria.  Musculoskeletal:  Negative for arthralgias and back pain.  Skin:  Negative for color change and rash.  Neurological:  Negative for seizures and syncope.  All other systems reviewed and are negative.  Physical Exam Updated Vital Signs BP (!) 185/86 (BP Location: Left Arm)   Pulse 97   Temp 98.3 F (36.8 C) (Oral)   Resp 18   Ht '5\' 1"'  (1.549 m)   Wt 67.1 kg   SpO2 100%   BMI 27.96 kg/m   Physical Exam Vitals and nursing note reviewed. Exam conducted with a chaperone present.  Constitutional:      Appearance: Normal appearance.     Comments: Patient actively vomiting into emesis  bag.   HENT:     Head: Normocephalic and atraumatic.  Eyes:     General: No scleral icterus.       Right eye: No discharge.        Left eye: No discharge.     Extraocular Movements: Extraocular movements intact.     Pupils: Pupils are equal, round, and reactive to light.  Cardiovascular:     Rate and Rhythm: Normal rate and regular rhythm.     Pulses: Normal pulses.     Heart sounds: Murmur heard.    No friction rub. No gallop.  Pulmonary:     Effort: Pulmonary effort is normal. No respiratory distress.     Breath sounds: Normal breath sounds.  Abdominal:  General: Abdomen is flat. Bowel sounds are normal. There is no distension.     Palpations: Abdomen is soft.     Tenderness: There is generalized abdominal tenderness.  Skin:    General: Skin is warm and dry.     Coloration: Skin is not jaundiced.  Neurological:     Mental Status: She is alert. Mental status is at baseline.     Coordination: Coordination normal.    ED Results / Procedures / Treatments   Labs (all labs ordered are listed, but only abnormal results are displayed) Labs Reviewed  LIPASE, BLOOD - Abnormal; Notable for the following components:      Result Value   Lipase 150 (*)    All other components within normal limits  COMPREHENSIVE METABOLIC PANEL - Abnormal; Notable for the following components:   Glucose, Bld 201 (*)    BUN 37 (*)    Creatinine, Ser 2.80 (*)    GFR, Estimated 19 (*)    All other components within normal limits  URINALYSIS, ROUTINE W REFLEX MICROSCOPIC - Abnormal; Notable for the following components:   Glucose, UA 50 (*)    Ketones, ur 5 (*)    Protein, ur 100 (*)    Bacteria, UA RARE (*)    All other components within normal limits  I-STAT BETA HCG BLOOD, ED ([facility], [facility], [facility] ONLY) - Abnormal; Notable for the following components:   I-stat hCG, quantitative 12.6 (*)    All other components within normal limits  CBC    EKG None  Radiology CT ABDOMEN PELVIS WO  CONTRAST  Result Date: 06/11/2021 CLINICAL DATA:  Nausea, vomiting, epigastric abdominal pain EXAM: CT ABDOMEN AND PELVIS WITHOUT CONTRAST TECHNIQUE: Multidetector CT imaging of the abdomen and pelvis was performed following the standard protocol without IV contrast. COMPARISON:  09/19/2019 FINDINGS: Lower chest: The visualized lung bases are clear bilaterally. The visualized heart and pericardium are unremarkable peer Hepatobiliary: No focal liver abnormality is seen. Status post cholecystectomy. No biliary dilatation. Pancreas: Unremarkable Spleen: Unremarkable Adrenals/Urinary Tract: Adrenal glands are unremarkable. Kidneys are normal, without renal calculi, focal lesion, or hydronephrosis. Bladder is unremarkable. Stomach/Bowel: Moderate stool within the transverse colon and within the rectal vault. Small hiatal hernia. The stomach, small bowel, and large bowel are otherwise unremarkable. No evidence of obstruction or focal inflammation. The appendix is normal. No free intraperitoneal gas or fluid. Vascular/Lymphatic: Mild aortoiliac atherosclerotic calcification. No aortic aneurysm. No pathologic abdominal or pelvic lymphadenopathy. Reproductive: Status post hysterectomy. No adnexal masses. Other: No abdominal wall hernia identified. Musculoskeletal: No acute bone abnormality. No lytic or blastic bone lesions are identified. IMPRESSION: No acute intra-abdominal pathology identified. No interval change. No definite radiographic explanation for the patient's reported symptoms. Small hiatal hernia. Moderate stool without evidence of obstruction. Aortic Atherosclerosis (ICD10-I70.0). Electronically Signed   By: Fidela Salisbury MD   On: 06/11/2021 10:40    Procedures Procedures   Medications Ordered in ED Medications  promethazine (PHENERGAN) 25 mg in sodium chloride 0.9 % 50 mL IVPB (25 mg Intravenous New Bag/Given 06/11/21 0949)  LORazepam (ATIVAN) injection 2 mg (2 mg Intravenous Given 06/11/21 0848)   HYDROmorphone (DILAUDID) injection 1 mg (1 mg Intravenous Given 06/11/21 0851)    ED Course  I have reviewed the triage vital signs and the nursing notes.  Pertinent labs & imaging results that were available during my care of the patient were reviewed by me and considered in my medical decision making (see chart for details).  Clinical Course as  of 06/11/21 1052  Thu Jun 11, 2021  0850 Comment 3:        [HS]  0917 Lipase(!): 150 Elevated compared to 5 months. H/O pancreatitis in 2009 per chart review. She complaints of back pain and abdominal pain and presents with N/V. I suspect the N/V is due to gastroparesis/Cyclic [place]oming syndrome rather than from pancreatitis. However, I will order a CT without contrast (due to renal function) to evaluate given similarity between presentation and elevated lipase.  [HS]  0919 Comprehensive metabolic panel(!) BUN, Cr, and GFR are at baseline.  [HS]  0919 CBC No elevated WBC concerning for infectious etiology. No anemia  [HS]  0937 I-stat hCG, quantitative(!): 12.6 Not concerned for ectopic pregnancy  [HS]  0940 Patient reports some improvement to her nausea. Not actively vomiting. [HS]  1044 CT ABDOMEN PELVIS WO CONTRAST No evidence of pancreatitis or acute pathology [HS]  9407 Patient reports resolution of her symptoms. States she feels better.  [HS]    Clinical Course User Index [HS] Sherrill Raring, PA-C   MDM Rules/Calculators/A&P                          Patient is a 55 year old female presenting for nausea and vomiting.  She has a history of gastroparesis and cyclic vomiting syndrome.  Also per chart review she is a chronic marijuana user.  Vital signs are stable, but she is actively vomiting on exam.  We will give her the cocktail medicine that typically works.  We will also order basic labs.   Ddx includes pancreatitis, cyclic vomiting syndrome, gastritis   Labs came back and arm lipase is moderately elevated.  She has history of  pancreatitis in 2009, the presentation is somewhat similar.  Will evaluate with CT without contrast.  She reports improvement of her symptoms with the cocktail of medications/  Patient reports feeling significantly better and is requesting ice chips and fluids.  CT scans without findings of pancreatitis.  Patient reports feeling significantly improved and no longer has the symptoms that brought her to the emergency department.  I suspect her symptoms were due to cyclic vomiting syndrome.  Return precautions were given.  Patient is agreeable to being discharged and following up with her primary care provider.  Final Clinical Impression(s) / ED Diagnoses Final diagnoses:  Cyclic vomiting syndrome    Rx / DC Orders ED Discharge Orders     None        Sherrill Raring, Hershal Coria 06/11/21 1055    Carmin Muskrat, MD 06/11/21 1639

## 2021-06-11 NOTE — ED Triage Notes (Signed)
Pt states she has been vomiting for two days and having abd pain. Pt states that she has had cycles of this for years.

## 2021-06-11 NOTE — Discharge Instructions (Addendum)
You were evaluated in the emergency department today for nausea and vomiting.  Your work-up was reassuring, your CT scan did not show any signs of pancreatitis.  This is likely due to cyclic vomiting syndrome.  The condition returns, please feel free to return back to the emergency department for further treatment.  Follow-up with your primary care provider as needed.

## 2021-07-08 ENCOUNTER — Other Ambulatory Visit: Payer: Self-pay

## 2021-07-08 DIAGNOSIS — R1115 Cyclical vomiting syndrome unrelated to migraine: Secondary | ICD-10-CM

## 2021-07-08 DIAGNOSIS — E1143 Type 2 diabetes mellitus with diabetic autonomic (poly)neuropathy: Secondary | ICD-10-CM

## 2021-07-09 MED ORDER — OXYCODONE HCL 5 MG PO TABS: 5.0000 mg | ORAL_TABLET | Freq: Four times a day (QID) | ORAL | 0 refills | Status: DC | PRN

## 2021-07-09 MED ORDER — LORAZEPAM 1 MG PO TABS: 1.0000 mg | ORAL_TABLET | Freq: Four times a day (QID) | ORAL | 0 refills | Status: DC | PRN

## 2021-07-15 ENCOUNTER — Other Ambulatory Visit: Payer: Self-pay

## 2021-07-15 NOTE — Telephone Encounter (Signed)
Patient calls nurse line requesting new rx for Novofine pen needles. Patient reports that these are less painful than the BD pen needles.   I have pended the patient preferred pen needles.   Talbot Grumbling, RN

## 2021-07-16 MED ORDER — NOVOFINE PLUS PEN NEEDLE 32G X 4 MM MISC: 12 refills | Status: DC

## 2021-07-24 NOTE — Progress Notes (Signed)
Sent message, via epic in basket, requesting orders in epic from surgeon.  

## 2021-07-27 ENCOUNTER — Ambulatory Visit: Payer: Self-pay | Admitting: General Surgery

## 2021-08-03 ENCOUNTER — Other Ambulatory Visit: Payer: Self-pay | Admitting: Family Medicine

## 2021-08-03 NOTE — Progress Notes (Unsigned)
1 

## 2021-08-05 ENCOUNTER — Other Ambulatory Visit: Payer: Self-pay

## 2021-08-05 DIAGNOSIS — R1115 Cyclical vomiting syndrome unrelated to migraine: Secondary | ICD-10-CM

## 2021-08-05 DIAGNOSIS — E1143 Type 2 diabetes mellitus with diabetic autonomic (poly)neuropathy: Secondary | ICD-10-CM

## 2021-08-06 MED ORDER — OXYCODONE HCL 5 MG PO TABS: 5.0000 mg | ORAL_TABLET | Freq: Four times a day (QID) | ORAL | 0 refills | Status: AC | PRN

## 2021-08-06 MED ORDER — LORAZEPAM 1 MG PO TABS: 1.0000 mg | ORAL_TABLET | Freq: Four times a day (QID) | ORAL | 0 refills | Status: DC | PRN

## 2021-08-06 NOTE — Progress Notes (Addendum)
COVID swab appointment:  N/A  COVID Vaccine Completed:  Yes x3 Date COVID Vaccine completed:  04-15-20 05-10-20 Has received booster:12-21-20 COVID vaccine manufacturer: [employer]    Date of COVID positive in last 90 days:  No  PCP - Lissa Morales, MD Cardiologist - Quay Burow, MD  Chest x-ray - N/A EKG - 12-17-20 Epic Stress Test - N/A ECHO - 05-19-16 Epic Cardiac Cath - greater than 10 years Pacemaker/ICD device last checked: Spinal Cord Stimulator:  Sleep Study - N/A CPAP -   Fasting Blood Sugar - 120 to 140 prior to broken machine. Checks Blood Sugar - machine currently broken  Blood Thinner Instructions: Aspirin Instructions: Last Dose:  Activity level:  Difficulty climbing stairs due to back pain.  Lives alone and able to perform ADLs independently without chest pain or SOB.  Marland Kitchen     Anesthesia review:  Hx of MI, chest pain, murmur, HTN, DM  CBG 53 at PAT.  Patient refused to eat due to feeling full and concern for vomiting.  Has not eaten all day.  Drank a soda and repeat remained 53.  Konrad Felix, PA-C advised, chart reviewed and patient was advised to monitor and try to eat a small meal, and to call PCP if new symptoms.    Patient drank a cup of orange juice prior to leaving and said she was going to get some food on her way home.  Patient denies shortness of breath, fever, cough and chest pain at PAT appointment   Patient verbalized understanding of instructions that were given to them at the PAT appointment. Patient was also instructed that they will need to review over the PAT instructions again at home before surgery.

## 2021-08-06 NOTE — Patient Instructions (Addendum)
DUE TO COVID-19 ONLY ONE VISITOR IS ALLOWED TO COME WITH YOU AND STAY IN THE WAITING ROOM ONLY DURING PRE OP AND PROCEDURE.   **NO VISITORS ARE ALLOWED IN THE SHORT STAY AREA OR RECOVERY ROOM!!**         Your procedure is scheduled on: Friday, 08-14-21   Report to [facility] Main  Entrance   Report to admitting at 10:45 AM   Call this number if you have problems the morning of surgery [phone]   Do not eat food :After Midnight.   May have liquids until  9:45 AM  day of surgery  CLEAR LIQUID DIET  Foods Allowed                                                                     Foods Excluded  Water, Black Coffee and tea, regular and decaf              liquids that you cannot  Plain Jell-O in any flavor  (No red)                                    see through such as: Fruit ices (not with fruit pulp)                                      milk, soups, orange juice              Iced Popsicles (No red)                                      All solid food                                   Apple juices Sports drinks like Gatorade (No red) Lightly seasoned clear broth or consume(fat free) Sugar, honey syrup     Complete one Ensure drink the morning of surgery at 9:45 AM  the day of surgery.       The day of surgery:  Drink ONE (1) G2 the morning of surgery. Drink in one sitting. Do not sip.  This drink was given to you during your hospital  pre-op appointment visit. Nothing else to drink after completing the G2.          If you have questions, please contact your surgeon's office.     Oral Hygiene is also important to reduce your risk of infection.                                    Remember - BRUSH YOUR TEETH THE MORNING OF SURGERY WITH YOUR REGULAR TOOTHPASTE   Do NOT smoke after Midnight  Take these medicines the morning of surgery with A SIP OF WATER:  Atorvastatin, Carvedilol, Diltiazem, Lorazepam.  Oxycodone and Phenergan if needed.  How to Manage Your  Diabetes  Before and After Surgery  Why is it important to control my blood sugar before and after surgery? Improving blood sugar levels before and after surgery helps healing and can limit problems. A way of improving blood sugar control is eating a healthy diet by:  Eating less sugar and carbohydrates  Increasing activity/exercise  Talking with your doctor about reaching your blood sugar goals High blood sugars (greater than 180 mg/dL) can raise your risk of infections and slow your recovery, so you will need to focus on controlling your diabetes during the weeks before surgery. Make sure that the doctor who takes care of your diabetes knows about your planned surgery including the date and location.  How do I manage my blood sugar before surgery? Check your blood sugar at least 4 times a day, starting 2 days before surgery, to make sure that the level is not too high or low. Check your blood sugar the morning of your surgery when you wake up and every 2 hours until you get to the Short Stay unit. If your blood sugar is less than 70 mg/dL, you will need to treat for low blood sugar: Do not take insulin. Treat a low blood sugar (less than 70 mg/dL) with  cup of clear juice (cranberry or apple), 4 glucose tablets, OR glucose gel. Recheck blood sugar in 15 minutes after treatment (to make sure it is greater than 70 mg/dL). If your blood sugar is not greater than 70 mg/dL on recheck, call [phone] for further instructions. Report your blood sugar to the short stay nurse when you get to Short Stay.  If you are admitted to the hospital after surgery: Your blood sugar will be checked by the staff and you will probably be given insulin after surgery (instead of oral diabetes medicines) to make sure you have good blood sugar levels. The goal for blood sugar control after surgery is 80-180 mg/dL.   WHAT DO I DO ABOUT MY DIABETES MEDICATION?  Do not take oral diabetes medicines (pills) the  morning of surgery.  THE DAY BEFORE SURGERY:  Take Ozempic as prescribed.              Take Insulin Glargine as prescribed    Insulin Lispro, no bedtime dose       THE MORNING OF SURGERY:  Do not take Ozempic.                                     Take 50% of Insulin Glargine  Take 50% of Insulin Lispro if CBG 220 or greater.   Reviewed and Endorsed by [facility] Patient Education Committee, August 2015                               You may not have any metal on your body including hair pins, jewelry, and body piercing             Do not wear make-up, lotions, powders, perfumes or deodorant  Do not wear nail polish including gel and S&S, artificial/acrylic nails, or any other type of covering on natural nails including finger and toenails. If you have artificial nails, gel coating, etc. that needs to be removed by a nail salon please have this removed prior to surgery or surgery may need to be canceled/ delayed if the surgeon/ anesthesia feels like they are unable  to be safely monitored.   Do not shave  48 hours prior to surgery.            Do not bring valuables to the hospital. [place].   Contacts, dentures or bridgework may not be worn into surgery.     Patients discharged the day of surgery will not be allowed to drive home.   Please read over the following fact sheets you were given: IF YOU HAVE QUESTIONS ABOUT YOUR PRE OP INSTRUCTIONS PLEASE CALL [facility] - Preparing for Surgery Before surgery, you can play an important role.  Because skin is not sterile, your skin needs to be as free of germs as possible.  You can reduce the number of germs on your skin by washing with CHG (chlorahexidine gluconate) soap before surgery.  CHG is an antiseptic cleaner which kills germs and bonds with the skin to continue killing germs even after washing. Please DO NOT use if you have an allergy to CHG or antibacterial soaps.  If your  skin becomes reddened/irritated stop using the CHG and inform your nurse when you arrive at Short Stay. Do not shave (including legs and underarms) for at least 48 hours prior to the first CHG shower.  You may shave your face/neck.  Please follow these instructions carefully:  1.  Shower with CHG Soap the night before surgery and the  morning of surgery.  2.  If you choose to wash your hair, wash your hair first as usual with your normal  shampoo.  3.  After you shampoo, rinse your hair and body thoroughly to remove the shampoo.                             4.  Use CHG as you would any other liquid soap.  You can apply chg directly to the skin and wash.  Gently with a scrungie or clean washcloth.  5.  Apply the CHG Soap to your body ONLY FROM THE NECK DOWN.   Do   not use on face/ open                           Wound or open sores. Avoid contact with eyes, ears mouth and   genitals (private parts).                       Wash face,  Genitals (private parts) with your normal soap.             6.  Wash thoroughly, paying special attention to the area where your    surgery  will be performed.  7.  Thoroughly rinse your body with warm water from the neck down.  8.  DO NOT shower/wash with your normal soap after using and rinsing off the CHG Soap.                9.  Pat yourself dry with a clean towel.            10.  Wear clean pajamas.            11.  Place clean sheets on your bed the night of your first shower and do not  sleep with pets. Day of Surgery : Do not apply any lotions/deodorants the morning of surgery.  Please wear clean clothes  to the hospital/surgery center.  FAILURE TO FOLLOW THESE INSTRUCTIONS MAY RESULT IN THE CANCELLATION OF YOUR SURGERY  PATIENT SIGNATURE_________________________________  NURSE SIGNATURE__________________________________  ________________________________________________________________________

## 2021-08-07 ENCOUNTER — Encounter (HOSPITAL_COMMUNITY): Payer: Self-pay

## 2021-08-07 ENCOUNTER — Other Ambulatory Visit: Payer: Self-pay

## 2021-08-07 ENCOUNTER — Encounter (HOSPITAL_COMMUNITY)
Admission: RE | Admit: 2021-08-07 | Discharge: 2021-08-07 | Disposition: A | Discharge status: Home / Self Care | Payer: 59 | Source: Ambulatory Visit | Attending: General Surgery | Admitting: General Surgery

## 2021-08-07 DIAGNOSIS — Z01812 Encounter for preprocedural laboratory examination: Secondary | ICD-10-CM | POA: Insufficient documentation

## 2021-08-07 DIAGNOSIS — E119 Type 2 diabetes mellitus without complications: Secondary | ICD-10-CM | POA: Insufficient documentation

## 2021-08-07 LAB — BASIC METABOLIC PANEL
Anion gap: 3 — ABNORMAL LOW (ref 5–15)
BUN: 32 mg/dL — ABNORMAL HIGH (ref 6–20)
CO2: 24 mmol/L (ref 22–32)
Calcium: 9.3 mg/dL (ref 8.9–10.3)
Chloride: 112 mmol/L — ABNORMAL HIGH (ref 98–111)
Creatinine, Ser: 2.73 mg/dL — ABNORMAL HIGH (ref 0.44–1.00)
GFR, Estimated: 20 mL/min — ABNORMAL LOW (ref >60)
Glucose, Bld: 63 mg/dL — ABNORMAL LOW (ref 70–99)
Potassium: 5.1 mmol/L (ref 3.5–5.1)
Sodium: 139 mmol/L (ref 135–145)

## 2021-08-07 LAB — HEMOGLOBIN A1C
Hgb A1c MFr Bld: 6.7 % — ABNORMAL HIGH (ref 4.8–5.6)
Mean Plasma Glucose: 145.59 mg/dL

## 2021-08-07 LAB — CBC
HCT: 33 % — ABNORMAL LOW (ref 36.0–46.0)
Hemoglobin: 10.5 g/dL — ABNORMAL LOW (ref 12.0–15.0)
MCH: 31.1 pg (ref 26.0–34.0)
MCHC: 31.8 g/dL (ref 30.0–36.0)
MCV: 97.6 fL (ref 80.0–100.0)
Platelets: 240 10*3/uL (ref 150–400)
RBC: 3.38 MIL/uL — ABNORMAL LOW (ref 3.87–5.11)
RDW: 14.9 % (ref 11.5–15.5)
WBC: 6.7 10*3/uL (ref 4.0–10.5)
nRBC: 0 % (ref 0.0–0.2)

## 2021-08-07 LAB — GLUCOSE, CAPILLARY
Glucose-Capillary: 53 mg/dL — ABNORMAL LOW (ref 70–99)
Glucose-Capillary: 53 mg/dL — ABNORMAL LOW (ref 70–99)

## 2021-08-09 ENCOUNTER — Other Ambulatory Visit: Payer: Self-pay | Admitting: Family Medicine

## 2021-08-09 DIAGNOSIS — I152 Hypertension secondary to endocrine disorders: Secondary | ICD-10-CM

## 2021-08-09 DIAGNOSIS — E1159 Type 2 diabetes mellitus with other circulatory complications: Secondary | ICD-10-CM

## 2021-08-10 NOTE — Progress Notes (Signed)
Anesthesia Chart Review:  Pt is a same day work up   Case: [id number] Date/Time: 08/14/21 1230   Procedure: REMOVAL PORT-A-CATH   Anesthesia type: Choice   Pre-op diagnosis: FOREIGN BODY   Location: [place] [facility] / [facility] ORS   Surgeons: Mickeal Skinner, MD       DISCUSSION: Pt with multiple health problems including HTN, DM, CKD (stage IV), and cyclic vomiting  Last ED visit for cyclic vomiting 7/57/97  Hospitalized 28/20-60/15 for cyclic vomiting  VS: BP (!) 159/71   Pulse (!) 57   Temp 37 C (Oral)   Resp 18   Ht _0  (1.575 m)   Wt 70.8 kg   SpO2 100%   BMI 28.53 kg/m   PROVIDERS: - PCP is McDiarmid, Blane Ohara, MD - Used to see cardiologist Quay Burow, MD, for HTN. Last office visit 10/05/19  LABS - Cr 2.73, BUN 32; consistent with results over last year.   (all labs ordered are listed, but only abnormal results are displayed)  Labs Reviewed  HEMOGLOBIN A1C - Abnormal; Notable for the following components:      Result Value   Hgb A1c MFr Bld 6.7 (*)    All other components within normal limits  BASIC METABOLIC PANEL - Abnormal; Notable for the following components:   Chloride 112 (*)    Glucose, Bld 63 (*)    BUN 32 (*)    Creatinine, Ser 2.73 (*)    GFR, Estimated 20 (*)    Anion gap 3 (*)    All other components within normal limits  CBC - Abnormal; Notable for the following components:   RBC 3.38 (*)    Hemoglobin 10.5 (*)    HCT 33.0 (*)    All other components within normal limits  GLUCOSE, CAPILLARY - Abnormal; Notable for the following components:   Glucose-Capillary 53 (*)    All other components within normal limits  GLUCOSE, CAPILLARY - Abnormal; Notable for the following components:   Glucose-Capillary 53 (*)    All other components within normal limits    EKG 12/17/20:  Sinus tachycardia Consider right atrial enlargement Anterior infarct, old No significant change since last tracing  CV: Echo 05/19/16:  - Left ventricle: The  cavity size was normal. There was mild concentric hypertrophy. Systolic function was normal. Wall motion was normal; there were no regional wall motion abnormalities. Features are consistent with a pseudonormal left ventricular filling pattern, with concomitant abnormal relaxation and increased filling pressure (grade 2 diastolic dysfunction). Doppler parameters are consistent with high ventricular filling pressure.  - Aortic valve: Transvalvular velocity was within the normal range. There was no stenosis. There was no regurgitation.  - Mitral valve: Transvalvular velocity was within the normal range. There was no evidence for stenosis. There was trivial regurgitation.  - Left atrium: The atrium was moderately dilated.  - Right ventricle: The cavity size was normal. Wall thickness was normal. Systolic function was normal.  - Atrial septum: No defect or patent foramen ovale was identified by color flow Doppler.  - Tricuspid valve: There was trivial regurgitation.  - Pulmonary arteries: Systolic pressure was within the normal range. PA peak pressure: 24 mm Hg (S).    Past Medical History:  Diagnosis Date   Abdominal pain, generalized    Acquired bilateral hammer toes 01/20/2017   Acute post-traumatic stress disorder 11/17/2018   AKI (acute kidney injury) ([place])    Allergy    Anemia    Anxiety  Arthritis    At risk for polypharmacy 08/15/2014   Biceps tendinopathy of right upper extremity 04/05/2014   Blood transfusion 2003   Cataract    both eyes   Chronic generalized abdominal pain    Chronic kidney disease (CKD), stage III (moderate) (HCC)    Chronic leg cramping 07/18/2014   CKD (chronic kidney disease) 08/17/2013   Seen by Dr Joelyn Oms 07/28/15 ([place]) - (see scanned document): 20 years diabetes, 15 years HTN, stable CKD stage III with proteinuria.  - CKD, discussed NSAID avoidance, diabetes control with target A1c<7.5%, and HTN control with SBP<140. F/u 6 months and repeat  renal panel at that time. - Microalbuminuria: On lisinopril 11m, suspected secondary due to diabetic nephropathy. - Chronic back pain: No NSDAIDs. - Say amlodipine causes nausea  - Advised not to ACE-i during vomiting episodes     Complication of anesthesia    "problems waking up"   Cyclic vomiting syndrome    Delayed gastric emptying 05/12/2011     04/12/11  NUCLEAR MEDICINE GASTRIC EMPTYING SCAN  Radiopharmaceutical: 2.0 mCi [id number] colloid.  Comparison:  None.  Findings: At 1 hour, 81% of the counts remain in the stomach.  At 2 hours, 53% remain.  Normal is less than 30% at 2 hours.  IMPRESSION: Moderate delay in gastric emptying.       Depression    Depression with anxiety 02/23/2007   See [facility] dated 05/06/2016.  IC (KGwenlyn Saranwas called in and I did a brief assessment.    Diabetic gastroparesis ([facility]   /e-chart   Diabetic ketoacidosis without coma associated with type 2 diabetes mellitus ([facility]   DKA (diabetic ketoacidoses) 08/03/2016   Elevated cholesterol 11/28/2015   Emotional stress 12/30/2015   Stress after Breakup with partnes of 13 years on 12/27/2015    Encounter for chronic pain management 01/06/2015   Indication for chronic opioid: Chronic back pain, cyclic vomiting Medication and dose: oxycodone 63m60m# pills per month: 15 Last UDS date: 05/08/2015 - result pending Pain contract signed (Y/N): Yes, reviewed 01/06/2015  Date narcotic database last reviewed (include red flags): 05/08/2015 (no red flags).     Erosive esophagitis 08/06/2019   Esophagitis 04/18/2013   Documented by Dr. PerHenrene PastorD 03/2011  He also consulted on her during hospitalization 03/61/9509r cyclical vomiting. No repeat EGD necessary, treat esophagitis with PPI.     Esophagitis determined by endoscopy 04/18/2013   Documented by Dr. PerHenrene PastorD 03/2011  He also consulted on her during hospitalization 03/32/6712r cyclical vomiting. No repeat EGD necessary, treat esophagitis with PPI.     Essential hypertension,  malignant 02/23/2007   Jan 2015 - stopped recently-started norvasc due to reports of GI symptoms. 02/2015 - holding lisinopril, adding hydralazine 59m42mD, and will take 1/2 tab lisinopril when feeling better when not in cyclic vomiting flare    GERD (gastroesophageal reflux disease)    Grief reaction 10/16/2018   Heart murmur    Hematuria 07/09/2015   High risk social situation 08/31/2012   Hip pain, bilateral 03/31/2018   History of colonic polyps 11/15/2018   10/2017 screening colonoscopy Dr JohnScarlette Shortsven polyps were found in the sigmoid colon, transverse colon, ascending colon and cecum. The polyps were 2 to 10 mm in size. These polyps were removed with a cold snare.    History of multiple colonic tubular adenomas 11/15/2018   10/2019 Surveillance colonoscopy Dr JohnScarlette Shortsnd five (1-3 mm) polyps that were  removed by cold snares (polypectomies). Polyps in ascending colon and cecum. Histopathology:TUBULAR ADENOMA, NEGATIVE FOR HIGH GRADE DYSPLASIA (4).  Recommendation repeat surv. c-scopy in 3 yrs.  10/2017 screening colonoscopy Dr Scarlette Shorts: Seven tubular adenomas were found in the sigmoid colon, transverse colon, a   Hot flashes 07/29/2011   Hyperlipidemia associated with type 2 diabetes mellitus ([place]) 08/15/2014   Based on her choleserol, BP, smoker status, and that she is a diabetic, her 10 year ASCVD risk is 9.9% putting her in a category of risk that would benefit from high-intensity statin.     Hypertension    Incontinence 10/03/2017   INSOMNIA NOS 02/23/2007   Qualifier: Diagnosis of  By: Beryle Lathe     Intermittent constipation 06/06/2015   Intestinal impaction ([place])    Intractable vomiting 07/02/2016   Marijuana smoker, continuous    pt. denies does smoke marijuana   MIGRAINE, UNSPEC., W/O INTRACTABLE MIGRAINE 02/23/2007   Qualifier: Diagnosis of  By: Beryle Lathe     Myocardial infarction [facility]) 07/2007   "they say I've had a silent one; I don't know"   Nausea and vomiting  in adult 06/06/2013   Neuromuscular disorder ([place])    neuropathy in both legs   NEUROPATHY, DIABETIC 02/23/2007   Nonspecific low back pain 04/10/2007   Back pain is chronic. Oxycodone per prior notes helps pt be more active. Reportedly uses 4 tab/week oxy for intermittent worsening of chronic back pain but mostly for cyclic vom to keep her out of hospital.     Normocytic anemia    Onychomycosis 03/25/2015   PANCREATITIS 05/09/2008   Pneumonia    Pre-ulcerative calluses 05/27/2017   Primary income source is SS Disability Income 03/10/2017   Disability SSI   Protein-calorie malnutrition, severe ([place]) 12/17/2013   Prurigo nodularis 04/16/2021   Pure hypercholesterolemia 08/15/2014   Based on her choleserol, BP, smoker status, and that she is a diabetic, her 10 year ASCVD risk is 9.9% putting her in a category of risk that would benefit from high-intensity statin.     Pyelonephritis 06/09/2015   stranding about kidney on CT AP    Recurrent major depressive disorder ([place])    Rotator cuff syndrome 04/19/2014   rt. shoulder   Rotator cuff tendonopathies with partial tear, right 03/31/2018   05/23/18 MRI: 1. Severe tendonosis of the supraspinatus tendon with a partial-thickness bursal surface tear.     2. Mild tendinosis of the infraspinatus tendon.     3. Mild tendinosis of the subscapularis tendon   Smoker    Stye external 09/02/2017   TOBACCO DEPENDENCE 02/23/2007   2 cigarettes every few days 11/2012 3 cigarettes daily     Type II diabetes mellitus ([place])    Uncontrolled secondary diabetes mellitus with stage 3 CKD (GFR 30-59) ([place]) 10/23/2011   Nephropathy, gastroparesis  HgbA1c (06/2011) 7.6, 9.5, 12.2, 8.2, 10.9 (07/2012), 9.2 (02/11/2013), 7.7 (09/27/13)  Metformin: she has a history of cyclical vomiting; she would prefer not to take this medication  On lanuts 20U qam with improved control. 09/27/13    Vitamin D deficiency 11/28/2014    Past Surgical History:  Procedure Laterality Date   ABDOMINAL  HYSTERECTOMY  02/2002   CARDIAC CATHETERIZATION     CATARACT EXTRACTION W/ INTRAOCULAR LENS  IMPLANT, BILATERAL     CESAREAN SECTION     x 3   CHOLECYSTECTOMY  1980's   COLONOSCOPY     patient not sure   DILATION AND EVACUATION  X3  laparotomy and lysis of adhesions     left hand surgery     PORT-A-CATH placement Right 08/11/2005   Georgina Quint (Gen Surg)  "poor access; frequent hospitalizations"   SHOULDER ARTHROSCOPY Right 07/21/2019   Justice Britain (Ortho):  Arthroscopic rotator cuff repair; Arthroscopic distal clavicle resection; Arthroscopic subacromial decompression and bursectomy;  Debridement of degenerative labral tear   SMALL INTESTINE SURGERY     laporotomy with lysis of adhesions.   UPPER GI ENDOSCOPY      MEDICATIONS:  atorvastatin (LIPITOR) 40 MG tablet   blood glucose meter kit and supplies KIT   carvedilol (COREG) 12.5 MG tablet   diclofenac Sodium (VOLTAREN) 1 % GEL   diltiazem (CARDIZEM CD) 180 MG 24 hr capsule   fluticasone (FLONASE) 50 MCG/ACT nasal spray   glucose blood (ONETOUCH ULTRA) test strip   insulin glargine (LANTUS SOLOSTAR) 100 UNIT/ML Solostar Pen   insulin lispro (HUMALOG KWIKPEN) 100 UNIT/ML KwikPen   Insulin Pen Needle (NOVOFINE PLUS PEN NEEDLE) 32G X 4 MM MISC   Lancets (ONETOUCH ULTRASOFT) lancets   LORazepam (ATIVAN) 1 MG tablet   lubiprostone (AMITIZA) 24 MCG capsule   mupirocin ointment (BACTROBAN) 2 %   oxyCODONE (OXY IR/ROXICODONE) 5 MG immediate release tablet   OZEMPIC, 0.25 OR 0.5 MG/DOSE, 2 MG/1.5ML SOPN   promethazine (PHENERGAN) 25 MG suppository   promethazine (PHENERGAN) 25 MG tablet   QUEtiapine (SEROQUEL) 50 MG tablet   senna (SENOKOT) 8.6 MG TABS tablet   tiZANidine (ZANAFLEX) 2 MG tablet   No current facility-administered medications for this encounter.    If no changes, I anticipate pt can proceed with surgery as scheduled.   Willeen Cass, PhD, FNP-BC [facility]/Anesthesiology Phone:  [phone] 08/10/2021 10:07 AM

## 2021-08-10 NOTE — Anesthesia Preprocedure Evaluation (Addendum)
Anesthesia Evaluation  Patient identified by MRN, date of birth, ID band Patient awake    Reviewed: Allergy & Precautions, NPO status , Patient's Chart, lab work & pertinent test results  History of Anesthesia Complications Negative for: history of anesthetic complications  Airway Mallampati: II  TM Distance: >3 FB Neck ROM: Full    Dental  (+) Edentulous Upper   Pulmonary Current Smoker,    Pulmonary exam normal        Cardiovascular hypertension, Pt. on medications + Past MI  Normal cardiovascular exam  TTE 2017: mild LVH, grade 2 DD, moderate LAE   Neuro/Psych  Headaches, Anxiety Depression    GI/Hepatic GERD  Controlled,(+)     substance abuse  marijuana use,   Endo/Other  diabetes, Type 2, Insulin Dependent  Renal/GU Renal InsufficiencyRenal disease  negative genitourinary   Musculoskeletal  (+) Arthritis ,   Abdominal   Peds  Hematology  (+) anemia , Hgb 10.5   Anesthesia Other Findings Day of surgery medications reviewed with patient.  Reproductive/Obstetrics negative OB ROS                           Anesthesia Physical Anesthesia Plan  ASA: 3  Anesthesia Plan: MAC   Post-op Pain Management:    Induction:   PONV Risk Score and Plan: 1 and Treatment may vary due to age or medical condition, Propofol infusion, Ondansetron and Midazolam  Airway Management Planned: Natural Airway and Simple Face Mask  Additional Equipment: None  Intra-op Plan:   Post-operative Plan:   Informed Consent: I have reviewed the patients History and Physical, chart, labs and discussed the procedure including the risks, benefits and alternatives for the proposed anesthesia with the patient or authorized representative who has indicated his/her understanding and acceptance.       Plan Discussed with: CRNA  Anesthesia Plan Comments: (See APP note by Durel Salts, FNP )      Anesthesia  Quick Evaluation

## 2021-08-14 ENCOUNTER — Ambulatory Visit (HOSPITAL_COMMUNITY)
Admission: RE | Admit: 2021-08-14 | Discharge: 2021-08-14 | Disposition: A | Discharge status: Home / Self Care | Payer: 59 | Attending: General Surgery | Admitting: General Surgery

## 2021-08-14 ENCOUNTER — Ambulatory Visit (HOSPITAL_COMMUNITY): Payer: 59 | Admitting: Physician Assistant

## 2021-08-14 ENCOUNTER — Ambulatory Visit (HOSPITAL_COMMUNITY): Payer: 59 | Admitting: Anesthesiology

## 2021-08-14 ENCOUNTER — Encounter (HOSPITAL_COMMUNITY)
Admission: RE | Disposition: A | Discharge status: Home / Self Care | Payer: Self-pay | Source: Home / Self Care | Attending: General Surgery

## 2021-08-14 ENCOUNTER — Encounter (HOSPITAL_COMMUNITY): Payer: Self-pay | Admitting: General Surgery

## 2021-08-14 DIAGNOSIS — Z841 Family history of disorders of kidney and ureter: Secondary | ICD-10-CM | POA: Diagnosis not present

## 2021-08-14 DIAGNOSIS — Z91048 Other nonmedicinal substance allergy status: Secondary | ICD-10-CM | POA: Insufficient documentation

## 2021-08-14 DIAGNOSIS — Z886 Allergy status to analgesic agent status: Secondary | ICD-10-CM | POA: Insufficient documentation

## 2021-08-14 DIAGNOSIS — Z452 Encounter for adjustment and management of vascular access device: Secondary | ICD-10-CM | POA: Insufficient documentation

## 2021-08-14 DIAGNOSIS — Z888 Allergy status to other drugs, medicaments and biological substances status: Secondary | ICD-10-CM | POA: Insufficient documentation

## 2021-08-14 DIAGNOSIS — Z808 Family history of malignant neoplasm of other organs or systems: Secondary | ICD-10-CM | POA: Diagnosis not present

## 2021-08-14 DIAGNOSIS — Z79899 Other long term (current) drug therapy: Secondary | ICD-10-CM | POA: Diagnosis not present

## 2021-08-14 DIAGNOSIS — Z833 Family history of diabetes mellitus: Secondary | ICD-10-CM | POA: Diagnosis not present

## 2021-08-14 DIAGNOSIS — Z794 Long term (current) use of insulin: Secondary | ICD-10-CM | POA: Insufficient documentation

## 2021-08-14 DIAGNOSIS — Z8249 Family history of ischemic heart disease and other diseases of the circulatory system: Secondary | ICD-10-CM | POA: Diagnosis not present

## 2021-08-14 DIAGNOSIS — F17210 Nicotine dependence, cigarettes, uncomplicated: Secondary | ICD-10-CM | POA: Insufficient documentation

## 2021-08-14 DIAGNOSIS — Z91030 Bee allergy status: Secondary | ICD-10-CM | POA: Insufficient documentation

## 2021-08-14 DIAGNOSIS — Z881 Allergy status to other antibiotic agents status: Secondary | ICD-10-CM | POA: Insufficient documentation

## 2021-08-14 DIAGNOSIS — Z791 Long term (current) use of non-steroidal anti-inflammatories (NSAID): Secondary | ICD-10-CM | POA: Insufficient documentation

## 2021-08-14 DIAGNOSIS — Z885 Allergy status to narcotic agent status: Secondary | ICD-10-CM | POA: Insufficient documentation

## 2021-08-14 DIAGNOSIS — Z884 Allergy status to anesthetic agent status: Secondary | ICD-10-CM | POA: Insufficient documentation

## 2021-08-14 HISTORY — PX: PORT-A-CATH REMOVAL: SHX5289

## 2021-08-14 LAB — GLUCOSE, CAPILLARY
Glucose-Capillary: 78 mg/dL (ref 70–99)
Glucose-Capillary: 59 mg/dL — ABNORMAL LOW (ref 70–99)
Glucose-Capillary: 61 mg/dL — ABNORMAL LOW (ref 70–99)
Glucose-Capillary: 65 mg/dL — ABNORMAL LOW (ref 70–99)

## 2021-08-14 SURGERY — REMOVAL PORT-A-CATH
Anesthesia: Monitor Anesthesia Care | Laterality: Right

## 2021-08-14 MED ORDER — CHLORHEXIDINE GLUCONATE 0.12 % MT SOLN
15.0000 mL | Freq: Once | OROMUCOSAL | Status: AC
  Administered 2021-08-14: 15 mL via OROMUCOSAL

## 2021-08-14 MED ORDER — CLINDAMYCIN PHOSPHATE 900 MG/50ML IV SOLN: INTRAVENOUS | Status: DC | PRN

## 2021-08-14 MED ORDER — CLINDAMYCIN PHOSPHATE 900 MG/50ML IV SOLN
900.0000 mg | INTRAVENOUS | Status: AC
  Administered 2021-08-14: 900 mg via INTRAVENOUS
  Filled 2021-08-14: qty 50

## 2021-08-14 MED ORDER — BUPIVACAINE-EPINEPHRINE (PF) 0.25% -1:200000 IJ SOLN
INTRAMUSCULAR | Status: AC
  Filled 2021-08-14: qty 30

## 2021-08-14 MED ORDER — LACTATED RINGERS IV SOLN: INTRAVENOUS | Status: DC

## 2021-08-14 MED ORDER — BUPIVACAINE-EPINEPHRINE 0.25% -1:200000 IJ SOLN
INTRAMUSCULAR | Status: DC | PRN
  Administered 2021-08-14: 20 mL

## 2021-08-14 MED ORDER — PROPOFOL 500 MG/50ML IV EMUL
INTRAVENOUS | Status: DC | PRN
  Administered 2021-08-14: 100 ug/kg/min via INTRAVENOUS

## 2021-08-14 MED ORDER — ENSURE PRE-SURGERY PO LIQD: 296.0000 mL | Freq: Once | ORAL | Status: DC

## 2021-08-14 MED ORDER — PROPOFOL 10 MG/ML IV BOLUS
INTRAVENOUS | Status: AC
  Filled 2021-08-14: qty 20

## 2021-08-14 MED ORDER — CHLORHEXIDINE GLUCONATE CLOTH 2 % EX PADS: 6.0000 | MEDICATED_PAD | Freq: Once | CUTANEOUS | Status: DC

## 2021-08-14 MED ORDER — ONDANSETRON HCL 4 MG/2ML IJ SOLN
INTRAMUSCULAR | Status: AC
  Filled 2021-08-14: qty 2

## 2021-08-14 MED ORDER — ORAL CARE MOUTH RINSE: 15.0000 mL | Freq: Once | OROMUCOSAL | Status: AC

## 2021-08-14 MED ORDER — LIDOCAINE 2% (20 MG/ML) 5 ML SYRINGE
INTRAMUSCULAR | Status: AC
  Filled 2021-08-14: qty 5

## 2021-08-14 MED ORDER — FENTANYL CITRATE (PF) 100 MCG/2ML IJ SOLN
INTRAMUSCULAR | Status: AC
  Filled 2021-08-14: qty 2

## 2021-08-14 MED ORDER — PROPOFOL 10 MG/ML IV BOLUS
INTRAVENOUS | Status: DC | PRN
  Administered 2021-08-14: 40 mg via INTRAVENOUS

## 2021-08-14 MED ORDER — MIDAZOLAM HCL 2 MG/2ML IJ SOLN
INTRAMUSCULAR | Status: AC
  Filled 2021-08-14: qty 2

## 2021-08-14 MED ORDER — MIDAZOLAM HCL 2 MG/2ML IJ SOLN
INTRAMUSCULAR | Status: DC | PRN
  Administered 2021-08-14: 2 mg via INTRAVENOUS

## 2021-08-14 MED ORDER — PHENYLEPHRINE HCL (PRESSORS) 10 MG/ML IV SOLN
INTRAVENOUS | Status: AC
  Filled 2021-08-14: qty 2

## 2021-08-14 MED ORDER — FENTANYL CITRATE (PF) 100 MCG/2ML IJ SOLN
INTRAMUSCULAR | Status: DC | PRN
  Administered 2021-08-14 (×2): 50 ug via INTRAVENOUS

## 2021-08-14 MED ORDER — OXYCODONE HCL 5 MG PO TABS: 5.0000 mg | ORAL_TABLET | Freq: Four times a day (QID) | ORAL | 0 refills | Status: DC | PRN

## 2021-08-14 MED ORDER — FENTANYL CITRATE (PF) 100 MCG/2ML IJ SOLN: 25.0000 ug | INTRAMUSCULAR | Status: DC | PRN

## 2021-08-14 MED ORDER — LIDOCAINE 2% (20 MG/ML) 5 ML SYRINGE
INTRAMUSCULAR | Status: DC | PRN
  Administered 2021-08-14: 60 mg via INTRAVENOUS

## 2021-08-14 MED ORDER — 0.9 % SODIUM CHLORIDE (POUR BTL) OPTIME
TOPICAL | Status: DC | PRN
  Administered 2021-08-14: 1000 mL

## 2021-08-14 SURGICAL SUPPLY — 39 items
ADH SKN CLS APL DERMABOND .7 (GAUZE/BANDAGES/DRESSINGS) ×2
APL PRP STRL LF DISP 70% ISPRP (MISCELLANEOUS) ×1
APL SKNCLS STERI-STRIP NONHPOA (GAUZE/BANDAGES/DRESSINGS) ×1
BAG COUNTER SPONGE SURGICOUNT (BAG) IMPLANT
BAG SPNG CNTER NS LX DISP (BAG)
BAG SURGICOUNT SPONGE COUNTING (BAG)
BENZOIN TINCTURE PRP APPL 2/3 (GAUZE/BANDAGES/DRESSINGS) ×3 IMPLANT
BLADE SURG 15 STRL LF DISP TIS (BLADE) IMPLANT
BLADE SURG 15 STRL SS (BLADE)
CHLORAPREP W/TINT 26 (MISCELLANEOUS) ×3 IMPLANT
CLOSURE WOUND 1/2 X4 (GAUZE/BANDAGES/DRESSINGS) ×1
COVER SURGICAL LIGHT HANDLE (MISCELLANEOUS) ×3 IMPLANT
DECANTER SPIKE VIAL GLASS SM (MISCELLANEOUS) IMPLANT
DERMABOND ADVANCED (GAUZE/BANDAGES/DRESSINGS) ×4
DERMABOND ADVANCED .7 DNX12 (GAUZE/BANDAGES/DRESSINGS) ×2 IMPLANT
DRAPE LAPAROTOMY TRNSV 102X78 (DRAPES) ×3 IMPLANT
ELECT COATED BLADE 2.86 ST (ELECTRODE) IMPLANT
ELECT REM PT RETURN 15FT ADLT (MISCELLANEOUS) ×3 IMPLANT
GAUZE 4X4 16PLY ~~LOC~~+RFID DBL (SPONGE) IMPLANT
GAUZE SPONGE 4X4 12PLY STRL (GAUZE/BANDAGES/DRESSINGS) ×3 IMPLANT
GLOVE SURG ENC MOIS LTX SZ6 (GLOVE) IMPLANT
GLOVE SURG UNDER LTX SZ6.5 (GLOVE) IMPLANT
GLOVE SURG UNDER POLY LF SZ6.5 (GLOVE) IMPLANT
GLOVE SURG UNDER POLY LF SZ7 (GLOVE) ×3 IMPLANT
GOWN STRL REUS W/TWL 2XL LVL3 (GOWN DISPOSABLE) IMPLANT
GOWN STRL REUS W/TWL XL LVL3 (GOWN DISPOSABLE) ×3 IMPLANT
KIT BASIN OR (CUSTOM PROCEDURE TRAY) ×3 IMPLANT
KIT TURNOVER KIT A (KITS) ×3 IMPLANT
MARKER SKIN DUAL TIP RULER LAB (MISCELLANEOUS) IMPLANT
NEEDLE HYPO 22GX1.5 SAFETY (NEEDLE) IMPLANT
NEEDLE HYPO 25X1 1.5 SAFETY (NEEDLE) IMPLANT
PACK GENERAL/GYN (CUSTOM PROCEDURE TRAY) ×3 IMPLANT
PENCIL SMOKE EVACUATOR (MISCELLANEOUS) IMPLANT
STRIP CLOSURE SKIN 1/2X4 (GAUZE/BANDAGES/DRESSINGS) ×2 IMPLANT
SUT MNCRL AB 4-0 PS2 18 (SUTURE) ×3 IMPLANT
SUT VIC AB 3-0 SH 27 (SUTURE)
SUT VIC AB 3-0 SH 27X BRD (SUTURE) IMPLANT
SYR CONTROL 10ML LL (SYRINGE) IMPLANT
TOWEL OR 17X26 10 PK STRL BLUE (TOWEL DISPOSABLE) ×3 IMPLANT

## 2021-08-14 NOTE — Anesthesia Postprocedure Evaluation (Signed)
Anesthesia Post Note  Patient: [name] [name]  Procedure(s) Performed: REMOVAL PORT-A-CATH (Right)     Patient location during evaluation: PACU Anesthesia Type: MAC Level of consciousness: awake and alert and oriented Pain management: pain level controlled Vital Signs Assessment: post-procedure vital signs reviewed and stable Respiratory status: spontaneous breathing, nonlabored ventilation and respiratory function stable Cardiovascular status: blood pressure returned to baseline Postop Assessment: no apparent nausea or vomiting Anesthetic complications: no   No notable events documented.  Last Vitals:  Vitals:   08/14/21 1453 08/14/21 1500  BP: (!) 175/75 (!) 176/75  Pulse: (!) 55 (!) 54  Resp: 18 12  Temp:  36.5 C  SpO2: 100% 100%    Last Pain:  Vitals:   08/14/21 1500  TempSrc:   PainSc: 0-No pain                 Marthenia Rolling

## 2021-08-14 NOTE — Transfer of Care (Signed)
Immediate Anesthesia Transfer of Care Note  Patient: [name] [name]  Procedure(s) Performed: REMOVAL PORT-A-CATH (Right)  Patient Location: PACU  Anesthesia Type:MAC  Level of Consciousness: awake, alert , oriented and patient cooperative  Airway & Oxygen Therapy: Patient Spontanous Breathing and Patient connected to nasal cannula oxygen  Post-op Assessment: Report given to RN, Post -op Vital signs reviewed and stable and Patient moving all extremities  Post vital signs: Reviewed and stable  Last Vitals:  Vitals Value Taken Time  BP 127/89 08/14/21 1433  Temp 36.5 C 08/14/21 1433  Pulse 55 08/14/21 1441  Resp 14 08/14/21 1441  SpO2 98 % 08/14/21 1441  Vitals shown include unvalidated device data.  Last Pain:  Vitals:   08/14/21 1132  TempSrc:   PainSc: 0-No pain         Complications: No notable events documented.

## 2021-08-14 NOTE — Op Note (Signed)
Preoperative diagnosis: Removal of right subclavian port  Postoperative diagnosis: same   Procedure: Removal of right subclavian port  Surgeon: Gurney Maxin, M.D.  Asst: [name] MD  Anesthesia: MAC  Indications for procedure: [name] [name] is a 55 y.o. year old female with a right subclavian port that she is no longer using and would like removed.  Description of procedure: The patient was brought into the operative suite. Anesthesia was administered with Monitored Local Anesthesia with Sedation. WHO checklist was applied. The patient was then placed in supine position. The area was prepped and draped in the usual sterile fashion.  20 ml of local was used to infiltrate the skin and surrounding tissue. An incision was made over the prior port incision. A combination of blunt and electrocautery was used to dissect the subcutaneous tissue until the port was encountered. Two prolene sutures were previously used to secure the port and these were removed with mayo scissors. The port was extracted and the line was removed from the subclavian. Gentle pressure was used over the subclavian as the line was removed and no back bleeding was noted. Interupted 3-0 vicryl stitches were used to close the subcutaneous tissue. A 4-0 monocryl was used to close the skin. Dermabond was applied.  Findings: Port was removed intact  Specimen: None  Implant: None   Blood loss: 5cc   Local anesthesia:  20 ml marcaine   Complications: No immediate complications  Gurney Maxin, M.D. General, Bariatric, & Minimally Invasive Surgery [facility]

## 2021-08-14 NOTE — Progress Notes (Signed)
Patient informed her surgery has been delayed at least another hour due to surgeon delay.  Patient voiced understanding but voiced disappointment.

## 2021-08-14 NOTE — H&P (Signed)
[name] [name] is an 55 y.o. female.   Chief Complaint: foreign body HPI: 55 yo female with history of R port-o-cath insertion for vomiting issues. She has not used it in several years and has been having pain at the area. She presents for removal  Past Medical History:  Diagnosis Date   Abdominal pain, generalized    Acquired bilateral hammer toes 01/20/2017   Acute post-traumatic stress disorder 11/17/2018   AKI (acute kidney injury) ([place])    Allergy    Anemia    Anxiety    Arthritis    At risk for polypharmacy 08/15/2014   Biceps tendinopathy of right upper extremity 04/05/2014   Blood transfusion 2003   Cataract    both eyes   Chronic generalized abdominal pain    Chronic kidney disease (CKD), stage III (moderate) (HCC)    Chronic leg cramping 07/18/2014   CKD (chronic kidney disease) 08/17/2013   Seen by Dr Joelyn Oms 07/28/15 [facility]) - (see scanned document): 20 years diabetes, 15 years HTN, stable CKD stage III with proteinuria.  - CKD, discussed NSAID avoidance, diabetes control with target A1c<7.5%, and HTN control with SBP<140. F/u 6 months and repeat renal panel at that time. - Microalbuminuria: On lisinopril 825m, suspected secondary due to diabetic nephropathy. - Chronic back pain: No NSDAIDs. - Say amlodipine causes nausea  - Advised not to ACE-i during vomiting episodes     Complication of anesthesia    "problems waking up"   Cyclic vomiting syndrome    Delayed gastric emptying 05/12/2011     04/12/11  NUCLEAR MEDICINE GASTRIC EMPTYING SCAN  Radiopharmaceutical: 2.0 mCi [id number] colloid.  Comparison:  None.  Findings: At 1 hour, 81% of the counts remain in the stomach.  At 2 hours, 53% remain.  Normal is less than 30% at 2 hours.  IMPRESSION: Moderate delay in gastric emptying.       Depression    Depression with anxiety 02/23/2007   See [facility] Note dated 05/06/2016.  IC (KGwenlyn Saranwas called in and I did a brief assessment.    Diabetic gastroparesis ([facility]    /e-chart   Diabetic ketoacidosis without coma associated with type 2 diabetes mellitus ([place]   DKA (diabetic ketoacidoses) 08/03/2016   Elevated cholesterol 11/28/2015   Emotional stress 12/30/2015   Stress after Breakup with partnes of 13 years on 12/27/2015    Encounter for chronic pain management 01/06/2015   Indication for chronic opioid: Chronic back pain, cyclic vomiting Medication and dose: oxycodone 25m13m# pills per month: 15 Last UDS date: 05/08/2015 - result pending Pain contract signed (Y/N): Yes, reviewed 01/06/2015  Date narcotic database last reviewed (include red flags): 05/08/2015 (no red flags).     Erosive esophagitis 08/06/2019   Esophagitis 04/18/2013   Documented by Dr. PerHenrene PastorD 03/2011  He also consulted on her during hospitalization 03/15/8309r cyclical vomiting. No repeat EGD necessary, treat esophagitis with PPI.     Esophagitis determined by endoscopy 04/18/2013   Documented by Dr. PerHenrene PastorD 03/2011  He also consulted on her during hospitalization 03/40/7680r cyclical vomiting. No repeat EGD necessary, treat esophagitis with PPI.     Essential hypertension, malignant 02/23/2007   Jan 2015 - stopped recently-started norvasc due to reports of GI symptoms. 02/2015 - holding lisinopril, adding hydralazine 47m81mD, and will take 1/2 tab lisinopril when feeling better when not in cyclic vomiting flare    GERD (gastroesophageal reflux disease)    Grief  reaction 10/16/2018   Heart murmur    Hematuria 07/09/2015   High risk social situation 08/31/2012   Hip pain, bilateral 03/31/2018   History of colonic polyps 11/15/2018   10/2017 screening colonoscopy Dr Scarlette Shorts: Seven polyps were found in the sigmoid colon, transverse colon, ascending colon and cecum. The polyps were 2 to 10 mm in size. These polyps were removed with a cold snare.    History of multiple colonic tubular adenomas 11/15/2018   10/2019 Surveillance colonoscopy Dr Scarlette Shorts found five (1-3 mm) polyps that were removed by  cold snares (polypectomies). Polyps in ascending colon and cecum. Histopathology:TUBULAR ADENOMA, NEGATIVE FOR HIGH GRADE DYSPLASIA (4).  Recommendation repeat surv. c-scopy in 3 yrs.  10/2017 screening colonoscopy Dr Scarlette Shorts: Seven tubular adenomas were found in the sigmoid colon, transverse colon, a   Hot flashes 07/29/2011   Hyperlipidemia associated with type 2 diabetes mellitus ([place]) 08/15/2014   Based on her choleserol, BP, smoker status, and that she is a diabetic, her 10 year ASCVD risk is 9.9% putting her in a category of risk that would benefit from high-intensity statin.     Hypertension    Incontinence 10/03/2017   INSOMNIA NOS 02/23/2007   Qualifier: Diagnosis of  By: Beryle Lathe     Intermittent constipation 06/06/2015   Intestinal impaction ([place])    Intractable vomiting 07/02/2016   Marijuana smoker, continuous    pt. denies does smoke marijuana   MIGRAINE, UNSPEC., W/O INTRACTABLE MIGRAINE 02/23/2007   Qualifier: Diagnosis of  By: Beryle Lathe     Myocardial infarction [facility]) 07/2007   "they say I've had a silent one; I don't know"   Nausea and vomiting in adult 06/06/2013   Neuromuscular disorder ([place])    neuropathy in both legs   NEUROPATHY, DIABETIC 02/23/2007   Nonspecific low back pain 04/10/2007   Back pain is chronic. Oxycodone per prior notes helps pt be more active. Reportedly uses 4 tab/week oxy for intermittent worsening of chronic back pain but mostly for cyclic vom to keep her out of hospital.     Normocytic anemia    Onychomycosis 03/25/2015   PANCREATITIS 05/09/2008   Pneumonia    Pre-ulcerative calluses 05/27/2017   Primary income source is SS Disability Income 03/10/2017   Disability SSI   Protein-calorie malnutrition, severe ([place]) 12/17/2013   Prurigo nodularis 04/16/2021   Pure hypercholesterolemia 08/15/2014   Based on her choleserol, BP, smoker status, and that she is a diabetic, her 10 year ASCVD risk is 9.9% putting her in a category of risk that would  benefit from high-intensity statin.     Pyelonephritis 06/09/2015   stranding about kidney on CT AP    Recurrent major depressive disorder ([place])    Rotator cuff syndrome 04/19/2014   rt. shoulder   Rotator cuff tendonopathies with partial tear, right 03/31/2018   05/23/18 MRI: 1. Severe tendonosis of the supraspinatus tendon with a partial-thickness bursal surface tear.     2. Mild tendinosis of the infraspinatus tendon.     3. Mild tendinosis of the subscapularis tendon   Smoker    Stye external 09/02/2017   TOBACCO DEPENDENCE 02/23/2007   2 cigarettes every few days 11/2012 3 cigarettes daily     Type II diabetes mellitus ([place])    Uncontrolled secondary diabetes mellitus with stage 3 CKD (GFR 30-59) (HCC) 10/23/2011   Nephropathy, gastroparesis  HgbA1c (06/2011) 7.6, 9.5, 12.2, 8.2, 10.9 (07/2012), 9.2 (02/11/2013), 7.7 (09/27/13)  Metformin: she has a history  of cyclical vomiting; she would prefer not to take this medication  On lanuts 20U qam with improved control. 09/27/13    Vitamin D deficiency 11/28/2014    Past Surgical History:  Procedure Laterality Date   ABDOMINAL HYSTERECTOMY  02/2002   CARDIAC CATHETERIZATION     CATARACT EXTRACTION W/ INTRAOCULAR LENS  IMPLANT, BILATERAL     CESAREAN SECTION     x 3   CHOLECYSTECTOMY  1980's   COLONOSCOPY     patient not sure   DILATION AND EVACUATION  X3   laparotomy and lysis of adhesions     left hand surgery     PORT-A-CATH placement Right 08/11/2005   Georgina Quint (Gen Surg)  "poor access; frequent hospitalizations"   SHOULDER ARTHROSCOPY Right 07/21/2019   Justice Britain (Ortho):  Arthroscopic rotator cuff repair; Arthroscopic distal clavicle resection; Arthroscopic subacromial decompression and bursectomy;  Debridement of degenerative labral tear   SMALL INTESTINE SURGERY     laporotomy with lysis of adhesions.   UPPER GI ENDOSCOPY      Family History  Problem Relation Age of Onset   Diabetes type II Mother    Hypertension  Mother    Heart attack Mother    Other Mother        Leg amputation   Kidney disease Mother        Required dialysis 10 years   Diabetes type II Sister    Diabetes Sister    Hypertension Sister    Alcohol abuse Father    Diabetes Sister    Diabetes Sister    Cancer Sister 1       Brain   Colon cancer Neg Hx    Colon polyps Neg Hx    Esophageal cancer Neg Hx    Rectal cancer Neg Hx    Stomach cancer Neg Hx    Social History:  reports that she has been smoking cigarettes. She started smoking about 38 years ago. She has a 11.00 pack-year smoking history. She has never used smokeless tobacco. She reports current alcohol use. She reports current drug use. Drug: Marijuana.  Allergies:  Allergies  Allergen Reactions   Bee Venom Anaphylaxis    Required hospital visit to ER   Tegaderm Ag Mesh [Silver] Dermatitis    And [place] tape- causes blisters and itching   Acetaminophen Nausea And Vomiting   Novolog [Insulin Aspart] Other (See Comments)    Muscles in feet cramp   Statins Other (See Comments)    GI intolerance (N/V)   Flexeril [Cyclobenzaprine] Nausea And Vomiting   Lyrica [Pregabalin] Other (See Comments)    Leg cramping   Nsaids Nausea And Vomiting   Tape Other (See Comments)    Blisters   Ceftriaxone Other (See Comments)    Caused an ulcer in her mouth   Erythromycin Nausea And Vomiting    Medications Prior to Admission  Medication Sig Dispense Refill   atorvastatin (LIPITOR) 40 MG tablet Take 40 mg by mouth once a week.     carvedilol (COREG) 12.5 MG tablet Take 1 tablet (12.5 mg total) by mouth 2 (two) times daily with a meal. 180 tablet 3   diclofenac Sodium (VOLTAREN) 1 % GEL Apply 2 g topically 4 (four) times daily as needed (pain). 50 g 1   fluticasone (FLONASE) 50 MCG/ACT nasal spray Place 2 sprays into both nostrils daily. (Patient taking differently: Place 2 sprays into both nostrils daily as needed for rhinitis.) 16 g 6   insulin glargine (LANTUS  SOLOSTAR) 100 UNIT/ML Solostar Pen Inject 20 Units into the skin daily. 18 mL 3   insulin lispro (HUMALOG KWIKPEN) 100 UNIT/ML KwikPen Inject 2 Units into the skin See admin instructions. Inject 2 units subcutaneous daily only after eating at least half of your dinner. 10 mL 5   lubiprostone (AMITIZA) 24 MCG capsule TAKE 1 CAPSULE(24 MCG) BY MOUTH TWICE DAILY WITH A MEAL (Patient taking differently: Take 24 mcg by mouth daily as needed for constipation.) 90 capsule 3   OZEMPIC, 0.25 OR 0.5 MG/DOSE, 2 MG/1.5ML SOPN INJECT 0.5MG INTO THE SKIN ONCE A WEEK 1.5 mL 3   promethazine (PHENERGAN) 25 MG suppository Place 1 suppository (25 mg total) rectally every 6 (six) hours as needed for nausea or vomiting. 30 each 3   promethazine (PHENERGAN) 25 MG tablet Take 25 mg by mouth every 6 (six) hours as needed for nausea or vomiting.     QUEtiapine (SEROQUEL) 50 MG tablet Take 1 tablet (50 mg total) by mouth at bedtime. 90 tablet 1   tiZANidine (ZANAFLEX) 2 MG tablet TAKE 1 TABLET(2 MG) BY MOUTH THREE TIMES DAILY (Patient taking differently: Take 2 mg by mouth daily as needed for muscle spasms.) 90 tablet 0   blood glucose meter kit and supplies KIT Dispense based on patient and insurance preference. Use up to four times daily as directed. (FOR ICD-9 250.00, 250.01). 1 each 0   diltiazem (CARDIZEM CD) 180 MG 24 hr capsule TAKE 1 CAPSULE(180 MG) BY MOUTH DAILY 90 capsule 1   glucose blood (ONETOUCH ULTRA) test strip Please use to check blood sugar up to 4 times daily. 200 each 12   Insulin Pen Needle (NOVOFINE PLUS PEN NEEDLE) 32G X 4 MM MISC [place] injection twice per day. 100 each 12   Lancets (ONETOUCH ULTRASOFT) lancets Please use to check blood sugar up to four times daily. 100 each 12   LORazepam (ATIVAN) 1 MG tablet Take 1 tablet (1 mg total) by mouth every 6 (six) hours as needed for anxiety. 35 tablet 0   mupirocin ointment (BACTROBAN) 2 % Apply 1 application topically 2 (two) times daily. (Patient taking  differently: Apply 1 application topically daily as needed (put on scab).) 22 g 2   oxyCODONE (OXY IR/ROXICODONE) 5 MG immediate release tablet Take 1 tablet (5 mg total) by mouth every 6 (six) hours as needed for moderate pain or severe pain. 35 tablet 0   senna (SENOKOT) 8.6 MG TABS tablet Take 2 tablets (17.2 mg total) by mouth daily as needed for moderate constipation. (Patient not taking: No sig reported) 120 tablet prn    Results for orders placed or performed during the hospital encounter of 08/14/21 (from the past 48 hour(s))  Glucose, capillary     Status: None   Collection Time: 08/14/21 11:08 AM  Result Value Ref Range   Glucose-Capillary 78 70 - 99 mg/dL    Comment: Glucose reference range applies only to samples taken after fasting for at least 8 hours.   *Note: Due to a large number of results and/or encounters for the requested time period, some results have not been displayed. A complete set of results can be found in Results Review.   No results found.  Review of Systems  Constitutional:  Negative for chills and fever.  HENT:  Negative for hearing loss.   Respiratory:  Negative for cough.   Cardiovascular:  Negative for chest pain and palpitations.  Gastrointestinal:  Negative for abdominal pain, nausea and vomiting.  Genitourinary:  Negative for dysuria and urgency.  Musculoskeletal:  Negative for myalgias and neck pain.  Skin:  Negative for rash.  Neurological:  Negative for dizziness and headaches.  Hematological:  Does not bruise/bleed easily.  Psychiatric/Behavioral:  Negative for suicidal ideas.    Height '5\' 2"'  (1.575 m), weight 70.8 kg. Physical Exam Vitals reviewed.  Constitutional:      Appearance: She is well-developed.  HENT:     Head: Normocephalic and atraumatic.  Eyes:     Conjunctiva/sclera: Conjunctivae normal.     Pupils: Pupils are equal, round, and reactive to light.  Cardiovascular:     Rate and Rhythm: Normal rate and regular rhythm.      Comments: Right subclavian port-o-cath Pulmonary:     Effort: Pulmonary effort is normal.     Breath sounds: Normal breath sounds.  Abdominal:     General: Bowel sounds are normal. There is no distension.     Palpations: Abdomen is soft.     Tenderness: There is no abdominal tenderness.  Musculoskeletal:        General: Normal range of motion.     Cervical back: Normal range of motion and neck supple.  Skin:    General: Skin is warm and dry.  Neurological:     Mental Status: She is alert and oriented to person, place, and time.  Psychiatric:        Behavior: Behavior normal.     Assessment/Plan 55 yo female with port-o-cath -removal of port-o-cath -outpatient procedure  Mickeal Skinner, MD 08/14/2021, 11:14 AM

## 2021-08-15 ENCOUNTER — Encounter (HOSPITAL_COMMUNITY): Payer: Self-pay | Admitting: General Surgery

## 2021-08-20 NOTE — Addendum Note (Signed)
Addendum  created 08/20/21 1144 by Brennan Bailey, MD   Attestation recorded in [place], [place] accepted, Intraprocedure Attestations deleted, Intraprocedure Attestations filed, Dance movement psychotherapist edited

## 2021-09-03 ENCOUNTER — Telehealth: Payer: Self-pay

## 2021-09-03 ENCOUNTER — Ambulatory Visit: Payer: 59 | Admitting: Family Medicine

## 2021-09-03 ENCOUNTER — Other Ambulatory Visit: Payer: Self-pay

## 2021-09-03 ENCOUNTER — Encounter: Payer: Self-pay | Admitting: Family Medicine

## 2021-09-03 ENCOUNTER — Emergency Department (HOSPITAL_COMMUNITY): Payer: 59

## 2021-09-03 ENCOUNTER — Emergency Department (HOSPITAL_COMMUNITY)
Admission: EM | Admit: 2021-09-03 | Discharge: 2021-09-04 | Disposition: A | Discharge status: Home / Self Care | Payer: 59 | Attending: Emergency Medicine | Admitting: Emergency Medicine

## 2021-09-03 DIAGNOSIS — R109 Unspecified abdominal pain: Secondary | ICD-10-CM | POA: Insufficient documentation

## 2021-09-03 DIAGNOSIS — I129 Hypertensive chronic kidney disease with stage 1 through stage 4 chronic kidney disease, or unspecified chronic kidney disease: Secondary | ICD-10-CM | POA: Insufficient documentation

## 2021-09-03 DIAGNOSIS — E1140 Type 2 diabetes mellitus with diabetic neuropathy, unspecified: Secondary | ICD-10-CM | POA: Diagnosis not present

## 2021-09-03 DIAGNOSIS — N183 Chronic kidney disease, stage 3 unspecified: Secondary | ICD-10-CM | POA: Diagnosis not present

## 2021-09-03 DIAGNOSIS — Z794 Long term (current) use of insulin: Secondary | ICD-10-CM | POA: Insufficient documentation

## 2021-09-03 DIAGNOSIS — R112 Nausea with vomiting, unspecified: Secondary | ICD-10-CM | POA: Insufficient documentation

## 2021-09-03 DIAGNOSIS — E1122 Type 2 diabetes mellitus with diabetic chronic kidney disease: Secondary | ICD-10-CM

## 2021-09-03 DIAGNOSIS — F17210 Nicotine dependence, cigarettes, uncomplicated: Secondary | ICD-10-CM | POA: Diagnosis not present

## 2021-09-03 DIAGNOSIS — R197 Diarrhea, unspecified: Secondary | ICD-10-CM | POA: Diagnosis not present

## 2021-09-03 DIAGNOSIS — Z79899 Other long term (current) drug therapy: Secondary | ICD-10-CM | POA: Diagnosis not present

## 2021-09-03 DIAGNOSIS — R1115 Cyclical vomiting syndrome unrelated to migraine: Secondary | ICD-10-CM

## 2021-09-03 DIAGNOSIS — E1143 Type 2 diabetes mellitus with diabetic autonomic (poly)neuropathy: Secondary | ICD-10-CM

## 2021-09-03 HISTORY — DX: Type 2 diabetes mellitus with diabetic chronic kidney disease: E11.22

## 2021-09-03 LAB — COMPREHENSIVE METABOLIC PANEL
ALT: 12 U/L (ref 0–44)
AST: 20 U/L (ref 15–41)
Albumin: 4.5 g/dL (ref 3.5–5.0)
Alkaline Phosphatase: 77 U/L (ref 38–126)
Anion gap: 9 (ref 5–15)
BUN: 35 mg/dL — ABNORMAL HIGH (ref 6–20)
CO2: 23 mmol/L (ref 22–32)
Calcium: 10 mg/dL (ref 8.9–10.3)
Chloride: 108 mmol/L (ref 98–111)
Creatinine, Ser: 2.74 mg/dL — ABNORMAL HIGH (ref 0.44–1.00)
GFR, Estimated: 20 mL/min — ABNORMAL LOW (ref >60)
Glucose, Bld: 176 mg/dL — ABNORMAL HIGH (ref 70–99)
Potassium: 4.3 mmol/L (ref 3.5–5.1)
Sodium: 140 mmol/L (ref 135–145)
Total Bilirubin: 0.6 mg/dL (ref 0.3–1.2)
Total Protein: 7.8 g/dL (ref 6.5–8.1)

## 2021-09-03 LAB — CBC WITH DIFFERENTIAL/PLATELET
Abs Immature Granulocytes: 0.01 10*3/uL (ref 0.00–0.07)
Basophils Absolute: 0 10*3/uL (ref 0.0–0.1)
Basophils Relative: 0 %
Eosinophils Absolute: 0.1 10*3/uL (ref 0.0–0.5)
Eosinophils Relative: 2 %
HCT: 38.2 % (ref 36.0–46.0)
Hemoglobin: 12.8 g/dL (ref 12.0–15.0)
Immature Granulocytes: 0 %
Lymphocytes Relative: 24 %
Lymphs Abs: 1.8 10*3/uL (ref 0.7–4.0)
MCH: 31.3 pg (ref 26.0–34.0)
MCHC: 33.5 g/dL (ref 30.0–36.0)
MCV: 93.4 fL (ref 80.0–100.0)
Monocytes Absolute: 0.4 10*3/uL (ref 0.1–1.0)
Monocytes Relative: 5 %
Neutro Abs: 5.1 10*3/uL (ref 1.7–7.7)
Neutrophils Relative %: 69 %
Platelets: 238 10*3/uL (ref 150–400)
RBC: 4.09 MIL/uL (ref 3.87–5.11)
RDW: 14.2 % (ref 11.5–15.5)
WBC: 7.4 10*3/uL (ref 4.0–10.5)
nRBC: 0 % (ref 0.0–0.2)

## 2021-09-03 LAB — TROPONIN I (HIGH SENSITIVITY)
Troponin I (High Sensitivity): 11 ng/L (ref <18)
Troponin I (High Sensitivity): 23 ng/L — ABNORMAL HIGH (ref <18)

## 2021-09-03 MED ORDER — LABETALOL HCL 5 MG/ML IV SOLN
10.0000 mg | Freq: Once | INTRAVENOUS | Status: AC
  Administered 2021-09-03: 10 mg via INTRAVENOUS
  Filled 2021-09-03: qty 4

## 2021-09-03 MED ORDER — SODIUM CHLORIDE 0.9 % IV SOLN
12.5000 mg | Freq: Once | INTRAVENOUS | Status: AC
  Administered 2021-09-03: 12.5 mg via INTRAVENOUS
  Filled 2021-09-03: qty 12.5

## 2021-09-03 MED ORDER — DROPERIDOL 2.5 MG/ML IJ SOLN
2.5000 mg | Freq: Once | INTRAMUSCULAR | Status: AC
  Administered 2021-09-03: 2.5 mg via INTRAVENOUS
  Filled 2021-09-03: qty 2

## 2021-09-03 MED ORDER — LORAZEPAM 1 MG PO TABS: 1.0000 mg | ORAL_TABLET | Freq: Four times a day (QID) | ORAL | 0 refills | Status: DC | PRN

## 2021-09-03 MED ORDER — SODIUM CHLORIDE 0.9 % IV BOLUS
1000.0000 mL | Freq: Once | INTRAVENOUS | Status: AC
  Administered 2021-09-03: 1000 mL via INTRAVENOUS

## 2021-09-03 MED ORDER — LORAZEPAM 2 MG/ML IJ SOLN
1.0000 mg | Freq: Once | INTRAMUSCULAR | Status: AC
  Administered 2021-09-03: 1 mg via INTRAVENOUS
  Filled 2021-09-03: qty 1

## 2021-09-03 MED ORDER — HYDROMORPHONE HCL 1 MG/ML IJ SOLN
1.0000 mg | Freq: Once | INTRAMUSCULAR | Status: AC
  Administered 2021-09-03: 1 mg via INTRAVENOUS
  Filled 2021-09-03: qty 1

## 2021-09-03 NOTE — ED Notes (Signed)
Patient resting in stretcher comfortably. Eyes closed, Equal chest rise and fall. Patient alert to verbal stimuli. Call bell in reach, Stretcher in low and locked position. Side rails up x2.   

## 2021-09-03 NOTE — ED Triage Notes (Signed)
C/O chest pain, vomiting and abdominal pain x 2 days. Stated hx of cyclic vomiting,

## 2021-09-03 NOTE — ED Provider Notes (Signed)
Emergency Medicine Provider Triage Evaluation Note  [name] [name] , a 55 y.o. female  was evaluated in triage.  Pt complains of chest patient and nausea vomiting.  History of cyclic vomiting syndrome, says it states feels the same and she is dehydrated.  Also has chest pain that started on the way to the ED.  Came and went, did not last very long.  No shortness of breath.  Review of Systems  Positive: Chest pain, vomiting Negative: Abdominal pain  Physical Exam  BP (!) 183/94 (BP Location: Left Arm)   Pulse 92   Temp 98.7 F (37.1 C) (Oral)   Resp 18   SpO2 100%  Gen:   Awake, no distress.  Patient is tearful Resp:  Normal effort  MSK:   Moves extremities without difficulty  Other:    Medical Decision Making  Medically screening exam initiated at 10:18 AM.  Appropriate orders placed.  [name] [name] was informed that the remainder of the evaluation will be completed by another provider, this initial triage assessment does not replace that evaluation, and the importance of remaining in the ED until their evaluation is complete.  Patient is hypertensive, will evaluate for chest pain work-up.  Check electrolytes due to dehydration and vomiting, will need to treat that in the back.   Sherrill Raring, PA-C 09/03/21 1019    Tegeler, Gwenyth Allegra, MD 09/03/21 1113

## 2021-09-03 NOTE — Discharge Instructions (Addendum)
Please be sure to follow-up with your physician as soon as possible for appropriate outpatient management of your gastroparesis.

## 2021-09-03 NOTE — Telephone Encounter (Signed)
Pt calling from ED. Pt states she has been waiting all morning and could have kept her appointment with Dr. McDiarmid this am. Pt is asking Dr. McDiarmid to help her get seen at the ED or call her. She is feeling bad and needs to talk to someone. Pt can be reached at [phone]. Ottis Stain, CMA

## 2021-09-03 NOTE — ED Provider Notes (Signed)
[facility] EMERGENCY DEPARTMENT Provider Note   CSN: [id number] Arrival date & time: 09/03/21  1007     History Chief Complaint  Patient presents with   Chest Pain   Abdominal Pain   Nausea    [name] [name] is a 55 y.o. female.  HPI Patient with multiple medical issues including cyclic vomiting syndrome presents with 1 day of p.o. intolerance persistent nausea, vomiting, abdominal pain.  She describes the pain as the same she has experienced on multiple prior occasions.  Without obvious precipitant about 2 hours ago she developed inability to tolerate p.o. with worsening abdominal pain.  Since that time pain has been persistent, worse with episodes of vomiting. She has had some episodes of loose stool as well. No syncope, no chest pain, no dyspnea.  Pain is diffuse, sore, severe.    Past Medical History:  Diagnosis Date   Abdominal pain, generalized    Acquired bilateral hammer toes 01/20/2017   Acute post-traumatic stress disorder 11/17/2018   AKI (acute kidney injury) ([place])    Allergy    Anemia    Anxiety    Arthritis    At risk for polypharmacy 08/15/2014   Biceps tendinopathy of right upper extremity 04/05/2014   Blood transfusion 2003   Cannabis dependence, uncomplicated ([place]) 2/59/5638   Cataract    both eyes   Chronic generalized abdominal pain    Chronic kidney disease (CKD), stage III (moderate) (HCC)    Chronic leg cramping 07/18/2014   CKD (chronic kidney disease) 08/17/2013   Seen by Dr Joelyn Oms 07/28/15 [facility]) - (see scanned document): 20 years diabetes, 15 years HTN, stable CKD stage III with proteinuria.  - CKD, discussed NSAID avoidance, diabetes control with target A1c<7.5%, and HTN control with SBP<140. F/u 6 months and repeat renal panel at that time. - Microalbuminuria: On lisinopril 218m, suspected secondary due to diabetic nephropathy. - Chronic back pain: No NSDAIDs. - Say amlodipine causes nausea  - Advised not to ACE-i  during vomiting episodes     Complication of anesthesia    "problems waking up"   Cyclic vomiting syndrome    Delayed gastric emptying 05/12/2011     04/12/11  NUCLEAR MEDICINE GASTRIC EMPTYING SCAN  Radiopharmaceutical: 2.0 mCi [id number] colloid.  Comparison:  None.  Findings: At 1 hour, 81% of the counts remain in the stomach.  At 2 hours, 53% remain.  Normal is less than 30% at 2 hours.  IMPRESSION: Moderate delay in gastric emptying.       Depression    Depression with anxiety 02/23/2007   See [facility] Note dated 05/06/2016.  IC (KGwenlyn Saranwas called in and I did a brief assessment.    Diabetic gastroparesis ([place]   /e-chart   Diabetic ketoacidosis without coma associated with type 2 diabetes mellitus ([place]   DKA (diabetic ketoacidoses) 08/03/2016   Elevated cholesterol 11/28/2015   Emotional stress 12/30/2015   Stress after Breakup with partnes of 13 years on 12/27/2015    Encounter for chronic pain management 01/06/2015   Indication for chronic opioid: Chronic back pain, cyclic vomiting Medication and dose: oxycodone 18m100m# pills per month: 15 Last UDS date: 05/08/2015 - result pending Pain contract signed (Y/N): Yes, reviewed 01/06/2015  Date narcotic database last reviewed (include red flags): 05/08/2015 (no red flags).     Erosive esophagitis 08/06/2019   Esophagitis 04/18/2013   Documented by Dr. PerHenrene PastorD 03/2011  He also consulted on her during  hospitalization 02/5008 for cyclical vomiting. No repeat EGD necessary, treat esophagitis with PPI.     Esophagitis determined by endoscopy 04/18/2013   Documented by Dr. Henrene Pastor EGD 03/2011  He also consulted on her during hospitalization 38/1829 for cyclical vomiting. No repeat EGD necessary, treat esophagitis with PPI.     Essential hypertension, malignant 02/23/2007   Jan 2015 - stopped recently-started norvasc due to reports of GI symptoms. 02/2015 - holding lisinopril, adding hydralazine 6m TID, and will take 1/2 tab lisinopril when feeling better  when not in cyclic vomiting flare    GERD (gastroesophageal reflux disease)    Grief reaction 10/16/2018   Heart murmur    Hematuria 07/09/2015   High risk social situation 08/31/2012   Hip pain, bilateral 03/31/2018   History of colonic polyps 11/15/2018   10/2017 screening colonoscopy Dr JScarlette Shorts Seven polyps were found in the sigmoid colon, transverse colon, ascending colon and cecum. The polyps were 2 to 10 mm in size. These polyps were removed with a cold snare.    History of multiple colonic tubular adenomas 11/15/2018   10/2019 Surveillance colonoscopy Dr JScarlette Shortsfound five (1-3 mm) polyps that were removed by cold snares (polypectomies). Polyps in ascending colon and cecum. Histopathology:TUBULAR ADENOMA, NEGATIVE FOR HIGH GRADE DYSPLASIA (4).  Recommendation repeat surv. c-scopy in 3 yrs.  10/2017 screening colonoscopy Dr JScarlette Shorts Seven tubular adenomas were found in the sigmoid colon, transverse colon, a   Hot flashes 07/29/2011   Hyperlipidemia associated with type 2 diabetes mellitus ([place] 08/15/2014   Based on her choleserol, BP, smoker status, and that she is a diabetic, her 10 year ASCVD risk is 9.9% putting her in a category of risk that would benefit from high-intensity statin.     Hypertension    Incontinence 10/03/2017   INSOMNIA NOS 02/23/2007   Qualifier: Diagnosis of  By: HBeryle Lathe    Intermittent constipation 06/06/2015   Intestinal impaction ([place]    Intractable vomiting 07/02/2016   Marijuana smoker, continuous    pt. denies does smoke marijuana   MIGRAINE, UNSPEC., W/O INTRACTABLE MIGRAINE 02/23/2007   Qualifier: Diagnosis of  By: HBeryle Lathe    Myocardial infarction ([facility] 07/2007   "they say I've had a silent one; I don't know"   Nausea and vomiting in adult 06/06/2013   Neuromuscular disorder ([place]    neuropathy in both legs   NEUROPATHY, DIABETIC 02/23/2007   Nonspecific low back pain 04/10/2007   Back pain is chronic. Oxycodone per prior notes helps  pt be more active. Reportedly uses 4 tab/week oxy for intermittent worsening of chronic back pain but mostly for cyclic vom to keep her out of hospital.     Normocytic anemia    Onychomycosis 03/25/2015   PANCREATITIS 05/09/2008   Pneumonia    Port-A-Cath in place 01/20/2017   Pre-ulcerative calluses 05/27/2017   Primary income source is SS Disability Income 03/10/2017   Disability SSI   Protein-calorie malnutrition, severe ([place] 12/17/2013   Prurigo nodularis 04/16/2021   Pure hypercholesterolemia 08/15/2014   Based on her choleserol, BP, smoker status, and that she is a diabetic, her 10 year ASCVD risk is 9.9% putting her in a category of risk that would benefit from high-intensity statin.     Pyelonephritis 06/09/2015   stranding about kidney on CT AP    Recurrent major depressive disorder ([place]    Rotator cuff syndrome 04/19/2014   rt. shoulder   Rotator cuff tendonopathies with partial tear, right  03/31/2018   05/23/18 MRI: 1. Severe tendonosis of the supraspinatus tendon with a partial-thickness bursal surface tear.     2. Mild tendinosis of the infraspinatus tendon.     3. Mild tendinosis of the subscapularis tendon   Smoker    Stye external 09/02/2017   TOBACCO DEPENDENCE 02/23/2007   2 cigarettes every few days 11/2012 3 cigarettes daily     Type 2 diabetes mellitus with diabetic chronic kidney disease ([place]) 09/03/2021   Type II diabetes mellitus ([place])    Uncontrolled secondary diabetes mellitus with stage 3 CKD (GFR 30-59) ([place]) 10/23/2011   Nephropathy, gastroparesis  HgbA1c (06/2011) 7.6, 9.5, 12.2, 8.2, 10.9 (07/2012), 9.2 (02/11/2013), 7.7 (09/27/13)  Metformin: she has a history of cyclical vomiting; she would prefer not to take this medication  On lanuts 20U qam with improved control. 09/27/13    Vitamin D deficiency 11/28/2014    Patient Active Problem List   Diagnosis Date Noted   Type 2 diabetes mellitus with diabetic chronic kidney disease ([place])    Eczema 04/30/2021   Prurigo  nodularis 04/16/2021   Muscle strain 04/15/2021   Neck pain 04/08/2021   Diabetic retinopathy, non-proliferative, of right eye with macular edema associated with type 2 diabetes mellitus ([place]) 12/23/2020   Mood disorder ([place])    Skin disorder 05/23/2020   Tubular adenoma of colon 12/03/2019   Continuous cannabis use 11/08/2019   Left ventricular diastolic dysfunction with preserved systolic function 77/82/4235   Primary income source is SS Disability Income 03/10/2017   Secondary hyperparathyroidism of renal origin ([place]) 10/05/2016   Hypertension associated with diabetes ([place]) 05/01/2016   Diabetic gastroparesis associated with type 2 diabetes mellitus ([place]) 05/01/2016   Normocytic anemia    Intermittent constipation 06/06/2015   Encounter for monitoring opioid maintenance therapy 01/06/2015   Vitamin D deficiency 11/28/2014   Hyperlipidemia associated with type 2 diabetes mellitus ([place]) 08/15/2014   Chronic kidney disease (CKD) stage G4/A2, severely decreased glomerular filtration rate (GFR) between 15-29 mL/min/1.73 square meter and albuminuria creatinine ratio between 30-299 mg/g (HCC) 08/17/2013   Allergic rhinitis, seasonal 36/14/4315   Cyclic vomiting syndrome 08/03/2011   Chronic bilateral low back pain 04/10/2007   Tobacco abuse 02/23/2007   Chronic insomnia secondary to General Medical and Mood disorders 02/23/2007   Diabetic autonomic neuropathy ([place]) 02/23/2007   GERD with esophagitis 01/27/2004    Past Surgical History:  Procedure Laterality Date   ABDOMINAL HYSTERECTOMY  02/2002   CARDIAC CATHETERIZATION     CATARACT EXTRACTION W/ INTRAOCULAR LENS  IMPLANT, BILATERAL     CESAREAN SECTION     x 3   CHOLECYSTECTOMY  1980's   COLONOSCOPY     patient not sure   DILATION AND EVACUATION  X3   laparotomy and lysis of adhesions     left hand surgery     PORT-A-CATH placement Right 08/11/2005   Georgina Quint (Gen Surg)  "poor access; frequent hospitalizations"    PORT-A-CATH REMOVAL Right 08/14/2021   Procedure: REMOVAL PORT-A-CATH;  Surgeon: Mickeal Skinner, MD;  Location: [facility] ORS;  Service: General;  Laterality: Right;   SHOULDER ARTHROSCOPY Right 07/21/2019   Justice Britain (Ortho):  Arthroscopic rotator cuff repair; Arthroscopic distal clavicle resection; Arthroscopic subacromial decompression and bursectomy;  Debridement of degenerative labral tear   SMALL INTESTINE SURGERY     laporotomy with lysis of adhesions.   UPPER GI ENDOSCOPY       OB History     Gravida  7   Para  Term      Preterm      AB  3   Living  4      SAB      IAB  3   Ectopic      Multiple      Live Births              Family History  Problem Relation Age of Onset   Diabetes type II Mother    Hypertension Mother    Heart attack Mother    Other Mother        Leg amputation   Kidney disease Mother        Required dialysis 10 years   Diabetes type II Sister    Diabetes Sister    Hypertension Sister    Alcohol abuse Father    Diabetes Sister    Diabetes Sister    Cancer Sister 1       Brain   Colon cancer Neg Hx    Colon polyps Neg Hx    Esophageal cancer Neg Hx    Rectal cancer Neg Hx    Stomach cancer Neg Hx     Social History   Tobacco Use   Smoking status: Every Day    Packs/day: 0.50    Years: 22.00    Pack years: 11.00    Types: Cigarettes    Start date: 12/27/1982   Smokeless tobacco: Never   Tobacco comments:    working on quitting - Down to 2 cigs a day  Vaping Use   Vaping Use: Never used  Substance Use Topics   Alcohol use: Yes    Comment: Rare   Drug use: Yes    Types: Marijuana    Comment: daily    Home Medications Prior to Admission medications   Medication Sig Start Date End Date Taking? Authorizing Provider  atorvastatin (LIPITOR) 40 MG tablet Take 40 mg by mouth once a week. 06/10/21   [provider]  blood glucose meter kit and supplies KIT Dispense based on patient and insurance  preference. Use up to four times daily as directed. (FOR ICD-9 250.00, 250.01). 08/28/20   McDiarmid, Blane Ohara, MD  carvedilol (COREG) 12.5 MG tablet Take 1 tablet (12.5 mg total) by mouth 2 (two) times daily with a meal. 05/14/21   McDiarmid, Blane Ohara, MD  diclofenac Sodium (VOLTAREN) 1 % GEL Apply 2 g topically 4 (four) times daily as needed (pain). 04/08/21   Carollee Leitz, MD  diltiazem (CARDIZEM CD) 180 MG 24 hr capsule TAKE 1 CAPSULE(180 MG) BY MOUTH DAILY 08/10/21   McDiarmid, Blane Ohara, MD  fluticasone (FLONASE) 50 MCG/ACT nasal spray Place 2 sprays into both nostrils daily. Patient taking differently: Place 2 sprays into both nostrils daily as needed for rhinitis. 04/16/21   McDiarmid, Blane Ohara, MD  glucose blood (ONETOUCH ULTRA) test strip Please use to check blood sugar up to 4 times daily. 03/17/21   McDiarmid, Blane Ohara, MD  insulin glargine (LANTUS SOLOSTAR) 100 UNIT/ML Solostar Pen Inject 20 Units into the skin daily. 05/14/21 08/14/21  McDiarmid, Blane Ohara, MD  insulin lispro (HUMALOG KWIKPEN) 100 UNIT/ML KwikPen Inject 2 Units into the skin See admin instructions. Inject 2 units subcutaneous daily only after eating at least half of your dinner. 08/03/21   McDiarmid, Blane Ohara, MD  Insulin Pen Needle (NOVOFINE PLUS PEN NEEDLE) 32G X 4 MM MISC [place] injection twice per day. 07/16/21   McDiarmid, Blane Ohara, MD  Lancets [facility] ULTRASOFT)  lancets Please use to check blood sugar up to four times daily. 03/17/21   McDiarmid, Blane Ohara, MD  LORazepam (ATIVAN) 1 MG tablet Take 1 tablet (1 mg total) by mouth every 6 (six) hours as needed for anxiety. 09/03/21   McDiarmid, Blane Ohara, MD  lubiprostone (AMITIZA) 24 MCG capsule TAKE 1 CAPSULE(24 MCG) BY MOUTH TWICE DAILY WITH A MEAL Patient taking differently: Take 24 mcg by mouth daily as needed for constipation. 11/10/20   McDiarmid, Blane Ohara, MD  mupirocin ointment (BACTROBAN) 2 % Apply 1 application topically 2 (two) times daily. Patient taking differently: Apply 1 application topically  daily as needed (put on scab). 02/09/21   Lavonna Monarch, MD  oxyCODONE (OXY IR/ROXICODONE) 5 MG immediate release tablet Take 1 tablet (5 mg total) by mouth every 6 (six) hours as needed for moderate pain or severe pain. 08/06/21 09/05/21  McDiarmid, Blane Ohara, MD  oxyCODONE (OXY IR/ROXICODONE) 5 MG immediate release tablet Take 1 tablet (5 mg total) by mouth every 6 (six) hours as needed for severe pain. 08/14/21   Kinsinger, Arta Bruce, MD  OZEMPIC, 0.25 OR 0.5 MG/DOSE, 2 MG/1.5ML SOPN INJECT 0.5MG INTO THE SKIN ONCE A WEEK 05/08/21   Hensel, Jamal Collin, MD  promethazine (PHENERGAN) 25 MG suppository Place 1 suppository (25 mg total) rectally every 6 (six) hours as needed for nausea or vomiting. 12/06/19   McDiarmid, Blane Ohara, MD  promethazine (PHENERGAN) 25 MG tablet Take 25 mg by mouth every 6 (six) hours as needed for nausea or vomiting.    [provider]  QUEtiapine (SEROQUEL) 50 MG tablet Take 1 tablet (50 mg total) by mouth at bedtime. 05/14/21   McDiarmid, Blane Ohara, MD  senna (SENOKOT) 8.6 MG TABS tablet Take 2 tablets (17.2 mg total) by mouth daily as needed for moderate constipation. Patient not taking: No sig reported 12/06/19   McDiarmid, Blane Ohara, MD  tiZANidine (ZANAFLEX) 2 MG tablet TAKE 1 TABLET(2 MG) BY MOUTH THREE TIMES DAILY Patient taking differently: Take 2 mg by mouth daily as needed for muscle spasms. 05/22/21   McDiarmid, Blane Ohara, MD    Allergies    Bee venom, Tegaderm ag mesh [silver], Acetaminophen, Novolog [insulin aspart], Statins, Flexeril [cyclobenzaprine], Lyrica [pregabalin], Nsaids, Tape, Ceftriaxone, and Erythromycin  Review of Systems   Review of Systems  Constitutional:        Per HPI, otherwise negative  HENT:         Per HPI, otherwise negative  Respiratory:         Per HPI, otherwise negative  Cardiovascular:        Per HPI, otherwise negative  Gastrointestinal:  Positive for abdominal pain, nausea and vomiting.  Endocrine:       Negative aside from HPI   Genitourinary:        Neg aside from HPI   Musculoskeletal:        Per HPI, otherwise negative  Skin: Negative.   Neurological:  Negative for syncope.   Physical Exam Updated Vital Signs BP (!) 189/89   Pulse 86   Temp 98.6 F (37 C) (Oral)   Resp 16   SpO2 100%   Physical Exam Vitals and nursing note reviewed.  Constitutional:      General: She is not in acute distress.    Appearance: She is well-developed.  HENT:     Head: Normocephalic and atraumatic.  Eyes:     Conjunctiva/sclera: Conjunctivae normal.  Cardiovascular:     Rate and Rhythm:  Normal rate and regular rhythm.  Pulmonary:     Effort: Pulmonary effort is normal. No respiratory distress.     Breath sounds: Normal breath sounds. No stridor.  Abdominal:     General: There is no distension.     Comments: Mild diffuse tenderness, no peritonitis  Skin:    General: Skin is warm and dry.  Neurological:     Mental Status: She is alert and oriented to person, place, and time.     Cranial Nerves: No cranial nerve deficit.    ED Results / Procedures / Treatments   Labs (all labs ordered are listed, but only abnormal results are displayed) Labs Reviewed  COMPREHENSIVE METABOLIC PANEL - Abnormal; Notable for the following components:      Result Value   Glucose, Bld 176 (*)    BUN 35 (*)    Creatinine, Ser 2.74 (*)    GFR, Estimated 20 (*)    All other components within normal limits  TROPONIN I (HIGH SENSITIVITY) - Abnormal; Notable for the following components:   Troponin I (High Sensitivity) 23 (*)    All other components within normal limits  CBC WITH DIFFERENTIAL/PLATELET  TROPONIN I (HIGH SENSITIVITY)    EKG EKG Interpretation  Date/Time:  Thursday September 03 2021 10:07:25 EDT Ventricular Rate:  68 PR Interval:  156 QRS Duration: 68 QT Interval:  378 QTC Calculation: 401 R Axis:   74 Text Interpretation: Normal sinus rhythm Anterior infarct , age undetermined Abnormal ECG Confirmed by  Carmin Muskrat [id number]) on 09/03/2021 7:14:20 PM  Radiology DG Chest 2 View  Result Date: 09/03/2021 CLINICAL DATA:  Chest pain EXAM: CHEST - 2 VIEW COMPARISON:  09/19/2019 FINDINGS: The heart size and mediastinal contours are within normal limits. No focal airspace consolidation, pleural effusion, or pneumothorax. The visualized skeletal structures are unremarkable. IMPRESSION: No active cardiopulmonary disease. Electronically Signed   By: Davina Poke D.O.   On: 09/03/2021 11:18    Procedures Procedures   Medications Ordered in ED Medications  droperidol (INAPSINE) 2.5 MG/ML injection 2.5 mg (has no administration in time range)  HYDROmorphone (DILAUDID) injection 1 mg (has no administration in time range)  sodium chloride 0.9 % bolus 1,000 mL (1,000 mLs Intravenous New Bag/Given 09/03/21 1926)  HYDROmorphone (DILAUDID) injection 1 mg (1 mg Intravenous Given 09/03/21 1926)  promethazine (PHENERGAN) 12.5 mg in sodium chloride 0.9 % 50 mL IVPB (0 mg Intravenous Stopped 09/03/21 2043)  LORazepam (ATIVAN) injection 1 mg (1 mg Intravenous Given 09/03/21 1925)    ED Course  I have reviewed the triage vital signs and the nursing notes.  Pertinent labs & imaging results that were available during my care of the patient were reviewed by me and considered in my medical decision making (see chart for details).  Chart review notable for multiple prior similar presentations including 1 to 2 months ago during which I evaluated the patient. Patient recently had removal of her right-sided report is well.  10:03 PM After appearing during which the patient appeared comfortable, had no vomiting, she is now actively vomiting.  Additional meds provided.  11:52 PM Patient sleeping.  She will require repeat evaluation when awake.  Low suspicion for acute new phenomena, high suspicion for patient's gastroparesis contributing to her presentation today. MDM Rules/Calculators/A&P MDM Number of Diagnoses or  Management Options Nausea vomiting and diarrhea: new, needed workup   Amount and/or Complexity of Data Reviewed Clinical lab tests: ordered and reviewed Tests in the radiology section of CPT: ordered and  reviewed Tests in the medicine section of CPT: reviewed and ordered Decide to obtain previous medical records or to obtain history from someone other than the patient: yes Review and summarize past medical records: yes Discuss the patient with other providers: yes Independent visualization of images, tracings, or specimens: yes  Risk of Complications, Morbidity, and/or Mortality Presenting problems: high Diagnostic procedures: high Management options: high  Critical Care Total time providing critical care: < 30 minutes  Patient Progress Patient progress: stable   Final Clinical Impression(s) / ED Diagnoses Final diagnoses:  Nausea vomiting and diarrhea     Carmin Muskrat, MD 09/03/21 2354

## 2021-09-04 DIAGNOSIS — R112 Nausea with vomiting, unspecified: Secondary | ICD-10-CM | POA: Diagnosis not present

## 2021-09-04 LAB — TROPONIN I (HIGH SENSITIVITY): Troponin I (High Sensitivity): 28 ng/L — ABNORMAL HIGH (ref <18)

## 2021-09-04 MED ORDER — ONDANSETRON 4 MG PO TBDP: 4.0000 mg | ORAL_TABLET | Freq: Three times a day (TID) | ORAL | 0 refills | Status: DC | PRN

## 2021-09-04 NOTE — Telephone Encounter (Signed)
Patient called to inquire about her pain medication. I informed her that it was refused due to being requested too early and she states that she was told to not run out and to call out office a few days before it was due.  I advised her that I would send this to provider.  She states that she is just getting home from the hospital and that's why she missed her appt.  Will forward to MD. [name]

## 2021-09-07 NOTE — Telephone Encounter (Signed)
I reviewed. [name] [name] requested pain medication refill 10 days early.

## 2021-09-08 ENCOUNTER — Other Ambulatory Visit: Payer: Self-pay

## 2021-09-08 MED ORDER — OXYCODONE HCL 5 MG PO TABS: 5.0000 mg | ORAL_TABLET | Freq: Four times a day (QID) | ORAL | 0 refills | Status: DC | PRN

## 2021-09-08 NOTE — Telephone Encounter (Signed)
Patient calls nurse line regarding rx refill on oxycodone. Patient states that she received last refill from Dr. Wendy Poet on 8/11. Per chart review patient received 6 tablets of oxycodone on 8/19 following hospital admission.   Please advise.   Talbot Grumbling, RN

## 2021-09-09 NOTE — Telephone Encounter (Signed)
Received phone call from [place] with [facility] regarding patient. Patient is having uncontrolled pain that is not being managed by 5 mg of oxycodone.   Patient has upcoming appointment on 9/29 with PCP. Please advise additional recommendations until follow up appointment.   Talbot Grumbling, RN

## 2021-09-10 NOTE — Telephone Encounter (Signed)
Reviewed

## 2021-09-16 ENCOUNTER — Encounter: Payer: Self-pay | Admitting: Family Medicine

## 2021-09-16 ENCOUNTER — Ambulatory Visit (INDEPENDENT_AMBULATORY_CARE_PROVIDER_SITE_OTHER): Payer: 59 | Admitting: Family Medicine

## 2021-09-16 ENCOUNTER — Other Ambulatory Visit: Payer: Self-pay

## 2021-09-16 VITALS — BP 168/67 | HR 95 | Wt 158.2 lb

## 2021-09-16 DIAGNOSIS — R300 Dysuria: Secondary | ICD-10-CM

## 2021-09-16 DIAGNOSIS — R350 Frequency of micturition: Secondary | ICD-10-CM | POA: Diagnosis not present

## 2021-09-16 DIAGNOSIS — Z1159 Encounter for screening for other viral diseases: Secondary | ICD-10-CM | POA: Diagnosis not present

## 2021-09-16 DIAGNOSIS — R1013 Epigastric pain: Secondary | ICD-10-CM

## 2021-09-16 LAB — POCT UA - MICROSCOPIC ONLY

## 2021-09-16 LAB — POCT URINALYSIS DIP (MANUAL ENTRY)
Bilirubin, UA: NEGATIVE
Glucose, UA: NEGATIVE mg/dL
Ketones, POC UA: NEGATIVE mg/dL
Leukocytes, UA: NEGATIVE
Nitrite, UA: NEGATIVE
Protein Ur, POC: >=300 mg/dL — AB
Spec Grav, UA: 1.02 (ref 1.010–1.025)
Urobilinogen, UA: 0.2 E.U./dL
pH, UA: 5.5 (ref 5.0–8.0)

## 2021-09-16 MED ORDER — CEPHALEXIN 500 MG PO CAPS: 500.0000 mg | ORAL_CAPSULE | Freq: Four times a day (QID) | ORAL | 0 refills | Status: AC

## 2021-09-16 NOTE — Patient Instructions (Addendum)
We will complete blood work today to check your liver and pancreas to make sure you do not have an episode of pancreatitis causing epigastric pain.  He will be important that you continue to hydrate himself orally.  You can continue to do the promethazine suppositories to help with your vomiting.  If you are unable to hydrate orally, please go to the emergency room for evaluation.  I will follow with you once the labs results are available.   We will collect a urine culture to see if it grows any specific bacteria.  Your urine test today shows protein which is likely due to your kidney disease.  Urinalysis did not show any signs of infection at this time however given your symptoms we could treat to see if it helps improve your symptoms.

## 2021-09-16 NOTE — Progress Notes (Signed)
    SUBJECTIVE:   CHIEF COMPLAINT / HPI: concern for UTI   Patient reports that she has been having GI upset and feeling nausea. She reports having cyclic emesis that is triggered with any infections or increased stress.   DYSURIA  Pain urinating started last week  Pain is: left side  Medications tried: none, due to kidney dz  Any antibiotics in the last 30 days: denies  More than 3 UTIs in the last 12 months: no  STD exposure: last exposure 2-3 months ago  Possibly pregnant: no, had hysterectomy   Symptoms Urgency: endorses Frequency: yes  Blood in urine: no  Pain in back: yes on left  Fever: reports feeling warm but has not checked temperature  Vaginal discharge: has some thickened white discharge while wiping   Reports having blood in her stool when ED on 09/03/21.  PERTINENT  PMH / PSH:  HTN  DM  CKD   OBJECTIVE:   BP (!) 168/67   Pulse 95   Wt 158 lb 3.2 oz (71.8 kg)   SpO2 100%   BMI 28.94 kg/m   General: female appearing stated age in no acute distress Cardio: Normal S1 and S2, no S3 or S4. Rhythm is regular. No murmurs or rubs.  Bilateral radial pulses palpable Pulm: Clear to auscultation bilaterally, no crackles, wheezing, or diminished breath sounds. Normal respiratory effort, stable on RA Abdomen: Minimal bowel sounds, abdomen is soft, patient has epigastric tenderness, left CVA tenderness and left flank tenderness, unable to palpate any masses, patient appears to have Ventral hernia Extremities: No peripheral edema. Warm/ well perfused.   ASSESSMENT/PLAN:   Dysuria Patient presents with 1 week of dysuria, increased frequency, left flank pain and left CVA pain with concern for UTI.  Urine analysis today without nitrites or leukocytes, shows small amount of red blood cells on microscopy with yeast and bacteria. Patient experiencing severe epigastric pain, will check CMP and lipase to assess for pancreatitis as well as assess for dehydration given patient's  reemergence of her cyclic vomiting symptoms. Patient has been using promethazine suppositories to control vomiting.  She has been able to tolerate oral fluids.   counseled patient on importance of maintaining hydration status and if she has persistent emesis without the ability to hydrate orally, she may need to be evaluated in the ED     Eulis Foster, MD [place]

## 2021-09-16 NOTE — Assessment & Plan Note (Signed)
Patient presents with 1 week of dysuria, increased frequency, left flank pain and left CVA pain with concern for UTI.  Urine analysis today without nitrites or leukocytes, shows small amount of red blood cells on microscopy with yeast and bacteria. Patient experiencing severe epigastric pain, will check CMP and lipase to assess for pancreatitis as well as assess for dehydration given patient's reemergence of her cyclic vomiting symptoms. Patient has been using promethazine suppositories to control vomiting.  She has been able to tolerate oral fluids.   counseled patient on importance of maintaining hydration status and if she has persistent emesis without the ability to hydrate orally, she may need to be evaluated in the ED

## 2021-09-19 LAB — COMPREHENSIVE METABOLIC PANEL
ALT: 13 IU/L (ref 0–32)
AST: 16 IU/L (ref 0–40)
Albumin/Globulin Ratio: 2 (ref 1.2–2.2)
Albumin: 4.8 g/dL (ref 3.8–4.9)
Alkaline Phosphatase: 87 IU/L (ref 44–121)
BUN/Creatinine Ratio: 16 (ref 9–23)
BUN: 40 mg/dL — ABNORMAL HIGH (ref 6–24)
Bilirubin Total: <0.2 mg/dL (ref 0.0–1.2)
CO2: 15 mmol/L — ABNORMAL LOW (ref 20–29)
Calcium: 10.2 mg/dL (ref 8.7–10.2)
Chloride: 107 mmol/L — ABNORMAL HIGH (ref 96–106)
Creatinine, Ser: 2.58 mg/dL — ABNORMAL HIGH (ref 0.57–1.00)
Globulin, Total: 2.4 g/dL (ref 1.5–4.5)
Glucose: 82 mg/dL (ref 65–99)
Potassium: 5.1 mmol/L (ref 3.5–5.2)
Sodium: 143 mmol/L (ref 134–144)
Total Protein: 7.2 g/dL (ref 6.0–8.5)
eGFR: 21 mL/min/{1.73_m2} — ABNORMAL LOW (ref >59)

## 2021-09-19 LAB — URINE CULTURE

## 2021-09-19 LAB — LIPASE: Lipase: 160 U/L — ABNORMAL HIGH (ref 14–72)

## 2021-09-19 LAB — HEPATITIS C ANTIBODY: Hep C Virus Ab: <0.1 {s_co_ratio} (ref 0.0–0.9)

## 2021-09-24 ENCOUNTER — Other Ambulatory Visit: Payer: Self-pay

## 2021-09-24 ENCOUNTER — Encounter: Payer: Self-pay | Admitting: Family Medicine

## 2021-09-24 ENCOUNTER — Ambulatory Visit (INDEPENDENT_AMBULATORY_CARE_PROVIDER_SITE_OTHER): Payer: 59 | Admitting: Family Medicine

## 2021-09-24 VITALS — BP 140/56 | HR 67 | Ht 62.0 in | Wt 160.1 lb

## 2021-09-24 DIAGNOSIS — Z1159 Encounter for screening for other viral diseases: Secondary | ICD-10-CM | POA: Diagnosis not present

## 2021-09-24 DIAGNOSIS — L989 Disorder of the skin and subcutaneous tissue, unspecified: Secondary | ICD-10-CM

## 2021-09-24 DIAGNOSIS — L309 Dermatitis, unspecified: Secondary | ICD-10-CM

## 2021-09-24 DIAGNOSIS — R1084 Generalized abdominal pain: Secondary | ICD-10-CM | POA: Diagnosis not present

## 2021-09-24 DIAGNOSIS — Z23 Encounter for immunization: Secondary | ICD-10-CM

## 2021-09-24 DIAGNOSIS — Z794 Long term (current) use of insulin: Secondary | ICD-10-CM

## 2021-09-24 DIAGNOSIS — E1122 Type 2 diabetes mellitus with diabetic chronic kidney disease: Secondary | ICD-10-CM

## 2021-09-24 DIAGNOSIS — F39 Unspecified mood [affective] disorder: Secondary | ICD-10-CM

## 2021-09-24 DIAGNOSIS — B3731 Acute candidiasis of vulva and vagina: Secondary | ICD-10-CM

## 2021-09-24 DIAGNOSIS — L281 Prurigo nodularis: Secondary | ICD-10-CM

## 2021-09-24 DIAGNOSIS — R748 Abnormal levels of other serum enzymes: Secondary | ICD-10-CM

## 2021-09-24 DIAGNOSIS — B373 Candidiasis of vulva and vagina: Secondary | ICD-10-CM

## 2021-09-24 DIAGNOSIS — R1115 Cyclical vomiting syndrome unrelated to migraine: Secondary | ICD-10-CM

## 2021-09-24 DIAGNOSIS — N184 Chronic kidney disease, stage 4 (severe): Secondary | ICD-10-CM

## 2021-09-24 DIAGNOSIS — E11311 Type 2 diabetes mellitus with unspecified diabetic retinopathy with macular edema: Secondary | ICD-10-CM

## 2021-09-24 DIAGNOSIS — E1143 Type 2 diabetes mellitus with diabetic autonomic (poly)neuropathy: Secondary | ICD-10-CM

## 2021-09-24 MED ORDER — LORAZEPAM 1 MG PO TABS: 1.0000 mg | ORAL_TABLET | Freq: Four times a day (QID) | ORAL | 0 refills | Status: DC | PRN

## 2021-09-24 MED ORDER — OZEMPIC (0.25 OR 0.5 MG/DOSE) 2 MG/1.5ML ~~LOC~~ SOPN: PEN_INJECTOR | SUBCUTANEOUS | 11 refills | Status: DC

## 2021-09-24 MED ORDER — FLUCONAZOLE 150 MG PO TABS: 150.0000 mg | ORAL_TABLET | Freq: Once | ORAL | 0 refills | Status: AC

## 2021-09-24 NOTE — Patient Instructions (Addendum)
Stop the antibiotic There is a anti-yeast tablet (Diflucan) available for you to take for yeast vaginitis.  Take just one tablet.   Your Ativan was refilled.  You may try holding the Ativan under your tongue when you are having an attack.  Let it dissolve under your tongue.  The Ativan will absorb thru the skin of your mouth.    We will work on getting you a dermatology referral.  We will remind General Surgery to schedule you a follow up appointment.    We are checking your Lipase (pancreas chemical).  If it is high, will recommend an ultrasound of your pancreas to make sure there are no cysts.    We are also checking your Kidneys and potassium today.   YOur A1c from 08/16/21 was 6.7%.  Great Job.    Refills sent for your Ozempic were sent in.  There should be enough refills for a year.

## 2021-09-25 ENCOUNTER — Encounter: Payer: Self-pay | Admitting: Family Medicine

## 2021-09-25 DIAGNOSIS — R748 Abnormal levels of other serum enzymes: Secondary | ICD-10-CM | POA: Insufficient documentation

## 2021-09-25 NOTE — Assessment & Plan Note (Addendum)
Recent exacerbation Evaluated and treated at [facility] ED 09/03/21. I reviewed ED records and imaging and labs.  Currently stable.  Patient was unable to keep down ativan during attack, so multiple tablets were wasted.  Refill Ativan monthly supply early because of waste during attack. Patient may dissolve Ativan tablets under tongue if unable to keep down tablets.

## 2021-09-25 NOTE — Assessment & Plan Note (Signed)
Established problem Well Controlled. No signs of complications, medication side effects, or red flags. Continue current medications and other regiments.  

## 2021-09-25 NOTE — Assessment & Plan Note (Signed)
Results for [name] [name], [name] [name] (MRN [id number]) as of 09/25/2021 11:48  Ref. Range 04/30/2016 18:32 07/03/2016 00:58 11/18/2016 02:48 06/14/2017 05:53 08/11/2018 18:27 03/01/2019 11:12 09/19/2019 12:15 12/16/2020 17:30 06/11/2021 07:06 09/16/2021 15:58  Lipase Latest Ref Range: 14 - 72 U/L 27 47 91 (H) 75 (H) 69 (H) 71 (H) 35 33 150 (H) 160 (H)   Recent elevations in Lipase above usual ones associated with vomiting and abdominal pain.  Could be evidence of pancreatitis, though presentation would be difficult to distinguish from cyclic vomiting/gastroparesis attacks.   Pancrea anatomy unremarkable on CT AP 06/11/21

## 2021-09-25 NOTE — Assessment & Plan Note (Signed)
Controlled established Continue current therapies

## 2021-09-25 NOTE — Progress Notes (Signed)
[name] [name] is alone Sources of clinical information for visit is/are patient and Fly-trap paper patient brough into office. Nursing assessment for this office visit was reviewed with the patient for accuracy and revision.     Previous Report(s) Reviewed: ER records, lab reports, and radiology reports  Depression screen [facility] 2/9 09/24/2021  Decreased Interest 0  Down, Depressed, Hopeless 0  PHQ - 2 Score 0  Altered sleeping 3  Tired, decreased energy 0  Change in appetite 3  Feeling bad or failure about yourself  0  Trouble concentrating 0  Moving slowly or fidgety/restless 0  Suicidal thoughts 0  PHQ-9 Score 6  Difficult doing work/chores Somewhat difficult  Some recent data might be hidden    Fall Risk  09/16/2021 06/05/2020 11/16/2018 07/28/2018 01/05/2018  Falls in the past year? 0 1 0 No No  Number falls in past yr: 0 0 0 - -  Injury with Fall? 0 1 0 - -  Risk for fall due to : - - - - -  Risk for fall due to: Comment - - - - -    PHQ9 SCORE ONLY 09/24/2021 09/16/2021 05/14/2021  PHQ-9 Total Score 6 8 13     Adult vaccines due  Topic Date Due   TETANUS/TDAP  09/29/2023    Health Maintenance Due  Topic Date Due   Zoster Vaccines- Shingrix (1 of 2) Never done   COVID-19 Vaccine (4 - Booster for [employer] series) 03/15/2021   FOOT EXAM  07/10/2021      History/P.E. limitations: none  Adult vaccines due  Topic Date Due   TETANUS/TDAP  09/29/2023    Diabetes Health Maintenance Due  Topic Date Due   FOOT EXAM  07/10/2021   OPHTHALMOLOGY EXAM  01/06/2022   HEMOGLOBIN A1C  02/07/2022    Health Maintenance Due  Topic Date Due   Zoster Vaccines- Shingrix (1 of 2) Never done   COVID-19 Vaccine (4 - Booster for [employer] series) 03/15/2021   FOOT EXAM  07/10/2021     Chief Complaint  Patient presents with   Diabetes

## 2021-09-25 NOTE — Assessment & Plan Note (Signed)
Established problem Continues to conplain of skin infestation.  patient brough in flypaper strip with many insects/bugs stuck to paper, most winged, size range from few mm to about a cm.  She claims that the insects on the flypaper look like the material she recovers when scratching her skin wounds.  I have seem the detritus [name] [name] has brought in that she reports coming from her skin wounds.  They have always had the appearance of fibers, hairs, fibrinous exudate.  None had the appearance of the flora she brought in today.   Patient wishes to get a second opinion about the possibility of infestation from a dermatologist other than her current dermatologist.   I will place a referral, though I am pessimistic the diagnosis will change significantly.

## 2021-10-07 ENCOUNTER — Other Ambulatory Visit: Payer: Self-pay

## 2021-10-07 MED ORDER — OXYCODONE HCL 5 MG PO TABS: 5.0000 mg | ORAL_TABLET | Freq: Four times a day (QID) | ORAL | 0 refills | Status: DC | PRN

## 2021-10-20 ENCOUNTER — Other Ambulatory Visit: Payer: Self-pay

## 2021-10-20 DIAGNOSIS — R1115 Cyclical vomiting syndrome unrelated to migraine: Secondary | ICD-10-CM

## 2021-10-20 MED ORDER — LORAZEPAM 1 MG PO TABS: 1.0000 mg | ORAL_TABLET | Freq: Four times a day (QID) | ORAL | 0 refills | Status: DC | PRN

## 2021-10-20 NOTE — Telephone Encounter (Signed)
Patient calls nurse line requesting rx refill on lorazepam. Patient also states that she is out of vitamin D supplements and was unsure if she was supposed to continue taking.   Please advise.   Talbot Grumbling, RN

## 2021-10-23 LAB — BASIC METABOLIC PANEL
BUN: 66 — AB (ref 4–21)
CO2: 27 — AB (ref 13–22)
Chloride: 91 — AB (ref 99–108)
Creatinine: 4.3 — AB (ref 0.5–1.1)
Glucose: 216
Potassium: 3.4 (ref 3.4–5.3)
Sodium: 132 — AB (ref 137–147)

## 2021-10-23 LAB — COMPREHENSIVE METABOLIC PANEL
Albumin: 4.6 (ref 3.5–5.0)
Calcium: 9.3 (ref 8.7–10.7)

## 2021-10-29 LAB — BASIC METABOLIC PANEL
BUN: 30 — AB (ref 4–21)
CO2: 23 — AB (ref 13–22)
Chloride: 105 (ref 99–108)
Creatinine: 2.5 — AB (ref 0.5–1.1)
Glucose: 49
Potassium: 4.6 (ref 3.4–5.3)
Sodium: 139 (ref 137–147)

## 2021-10-29 LAB — COMPREHENSIVE METABOLIC PANEL
Albumin: 4.2 (ref 3.5–5.0)
Calcium: 9.4 (ref 8.7–10.7)

## 2021-10-30 ENCOUNTER — Other Ambulatory Visit: Payer: Self-pay | Admitting: Family Medicine

## 2021-10-30 DIAGNOSIS — Z1231 Encounter for screening mammogram for malignant neoplasm of breast: Secondary | ICD-10-CM

## 2021-11-05 ENCOUNTER — Other Ambulatory Visit: Payer: Self-pay

## 2021-11-05 MED ORDER — OXYCODONE HCL 5 MG PO TABS: 5.0000 mg | ORAL_TABLET | Freq: Four times a day (QID) | ORAL | 0 refills | Status: DC | PRN

## 2021-11-13 ENCOUNTER — Encounter: Payer: Self-pay | Admitting: Family Medicine

## 2021-11-13 ENCOUNTER — Other Ambulatory Visit: Payer: Self-pay

## 2021-11-13 DIAGNOSIS — R1115 Cyclical vomiting syndrome unrelated to migraine: Secondary | ICD-10-CM

## 2021-11-13 LAB — PTH, INTACT: PTH, Intact: 290

## 2021-11-13 LAB — PHOSPHORUS: Phosphorus, Ser: 2.2

## 2021-11-13 MED ORDER — LORAZEPAM 1 MG PO TABS: 1.0000 mg | ORAL_TABLET | Freq: Four times a day (QID) | ORAL | 0 refills | Status: DC | PRN

## 2021-11-26 ENCOUNTER — Telehealth: Payer: Self-pay

## 2021-11-26 DIAGNOSIS — J40 Bronchitis, not specified as acute or chronic: Secondary | ICD-10-CM

## 2021-11-26 MED ORDER — PROMETHAZINE-DM 6.25-15 MG/5ML PO SYRP: 5.0000 mL | ORAL_SOLUTION | Freq: Four times a day (QID) | ORAL | 0 refills | Status: DC | PRN

## 2021-11-26 NOTE — Telephone Encounter (Signed)
Possible bronchitis Rx phenergan-diabetes mellitus 5 ml four times a day prn cough.  No refills

## 2021-11-26 NOTE — Telephone Encounter (Signed)
Patient calls nurse line reporting she was exposed to the flu. Patient reports daughter tested positive for the flu on Friday and they are staying with her. Patient reports sore throat, headaches, body aches and cough. Patient denies fevers or SOB at this time. Patient reports she has not tested for the flu she just assumes she has it.  Patient reports she is more concerned with the cough and would like something called in. Patient reports she is afraid if she continues to cough she will start vomiting with her hx of cyclic vomiting syndrome.   Will forward to PCP. Red flags discussed with patient.

## 2021-12-07 ENCOUNTER — Other Ambulatory Visit: Payer: Self-pay

## 2021-12-07 DIAGNOSIS — R1115 Cyclical vomiting syndrome unrelated to migraine: Secondary | ICD-10-CM

## 2021-12-07 MED ORDER — PROMETHAZINE HCL 25 MG RE SUPP: 25.0000 mg | Freq: Four times a day (QID) | RECTAL | 3 refills | Status: DC | PRN

## 2021-12-07 MED ORDER — OXYCODONE HCL 5 MG PO TABS: 5.0000 mg | ORAL_TABLET | Freq: Four times a day (QID) | ORAL | 0 refills | Status: DC | PRN

## 2021-12-08 ENCOUNTER — Ambulatory Visit: Payer: 59

## 2021-12-10 ENCOUNTER — Other Ambulatory Visit: Payer: Self-pay | Admitting: *Deleted

## 2021-12-10 ENCOUNTER — Other Ambulatory Visit: Payer: Self-pay | Admitting: Family Medicine

## 2021-12-10 DIAGNOSIS — R1115 Cyclical vomiting syndrome unrelated to migraine: Secondary | ICD-10-CM

## 2021-12-10 MED ORDER — LORAZEPAM 1 MG PO TABS: 1.0000 mg | ORAL_TABLET | Freq: Two times a day (BID) | ORAL | 0 refills | Status: DC | PRN

## 2021-12-10 NOTE — Telephone Encounter (Signed)
Please let Ms Papin know that 7 ativan tablets were went to her pharmacy. Refill of her monthly 35 tablets may be requested in a week.

## 2021-12-10 NOTE — Progress Notes (Unsigned)
Rx 7 additional Ativan 1 mg tab to treatment cyclic vomiting episode. Full Ativan refill next week.

## 2021-12-10 NOTE — Telephone Encounter (Signed)
Patient left a voicemail on the nurse line asking for a refill of her lorazepam.  She states that she has been sick for days and hasn't been seen at the ED due to the long wait times and "no beds".  She is asking for lorazepam to be refilled even if she has to pay out of pocket for it.  Will forward to MD to advise. Natallia Stellmach,CMA

## 2021-12-14 ENCOUNTER — Other Ambulatory Visit: Payer: Self-pay | Admitting: Family Medicine

## 2021-12-14 ENCOUNTER — Telehealth: Payer: Self-pay

## 2021-12-14 DIAGNOSIS — H4030X0 Glaucoma secondary to eye trauma, unspecified eye, stage unspecified: Secondary | ICD-10-CM

## 2021-12-14 DIAGNOSIS — S0510XA Contusion of eyeball and orbital tissues, unspecified eye, initial encounter: Secondary | ICD-10-CM

## 2021-12-14 DIAGNOSIS — R1115 Cyclical vomiting syndrome unrelated to migraine: Secondary | ICD-10-CM

## 2021-12-14 HISTORY — DX: Glaucoma secondary to eye trauma, unspecified eye, stage unspecified: H40.30X0

## 2021-12-14 HISTORY — DX: Contusion of eyeball and orbital tissues, unspecified eye, initial encounter: S05.10XA

## 2021-12-14 MED ORDER — LORAZEPAM 1 MG PO TABS
1.0000 mg | ORAL_TABLET | Freq: Four times a day (QID) | ORAL | 0 refills | Status: DC | PRN
Start: 2021-12-19 — End: 2021-12-24

## 2021-12-14 NOTE — Telephone Encounter (Signed)
Patient returns call to nurse line requesting refill on lorazepam. Patient reports that she received last monthly fill on 11/18, due to closures for Thanksgiving.   Please advise.   Talbot Grumbling, RN

## 2021-12-14 NOTE — Telephone Encounter (Signed)
Opened in error.   Blessyn [place] C Khayman Kirsch, RN  

## 2021-12-14 NOTE — Telephone Encounter (Signed)
Reviewed. [name] [name] received #35 lorazepam 1 mg tablets on 11/20/21 as her agreed upon 35 tablets for 30 days.  She received 7 additional lorazepam 1 mg tablets on 12/15. Total of 42 tablets since 11/20/21.  We will refill her lorazepam for 11/19/21.

## 2021-12-22 IMAGING — DX DG CHEST 2V
2 series · 2 of 2 positions shown · non-contrast
Comparison: 09/19/2019

CLINICAL DATA: Chest pain

EXAM:
CHEST - 2 VIEW

[w chest pa]
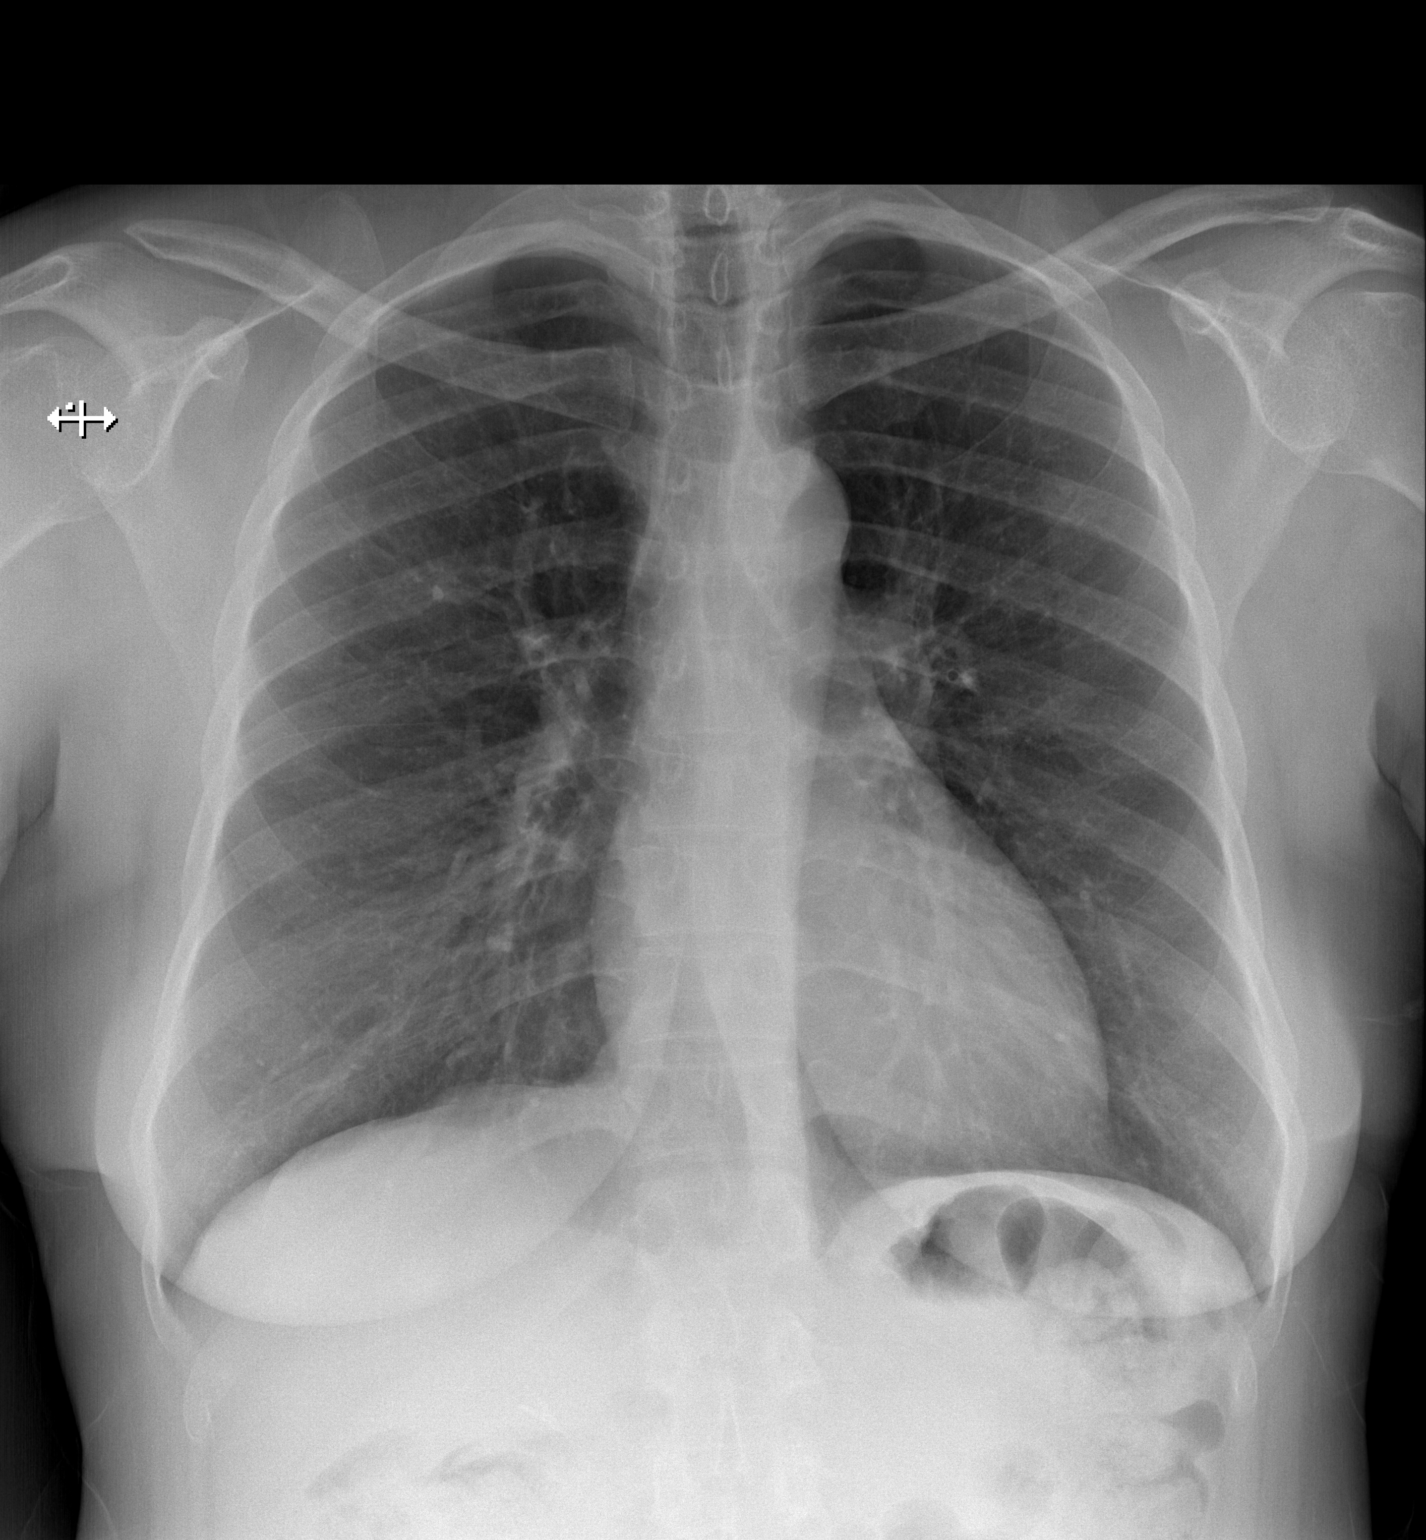

[w chest lat]
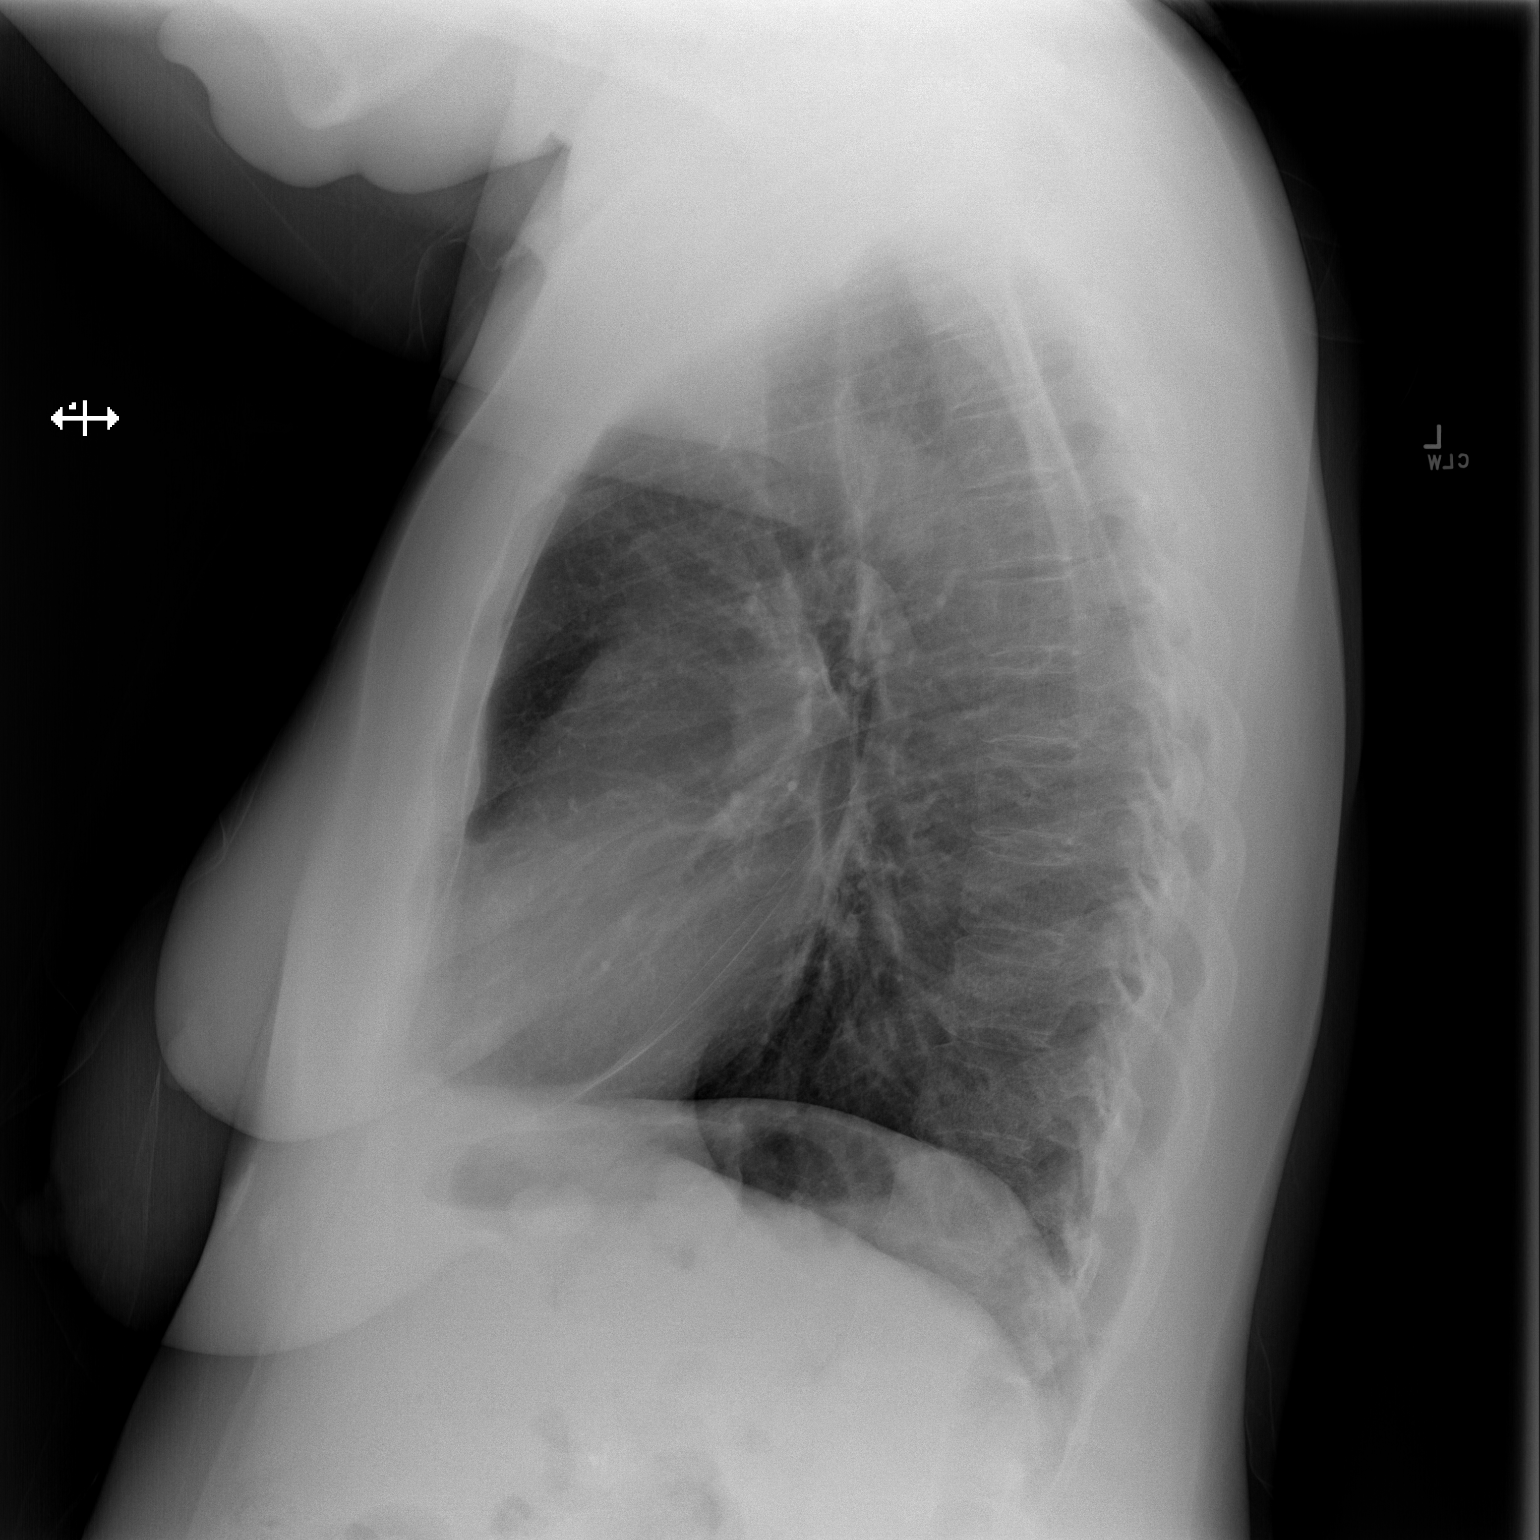

[2 of 2 positions shown; findings below may reference images not displayed]

FINDINGS: The heart size and mediastinal contours are within normal limits. No
focal airspace consolidation, pleural effusion, or pneumothorax. The
visualized skeletal structures are unremarkable.
IMPRESSION: No active cardiopulmonary disease.

## 2021-12-23 ENCOUNTER — Encounter: Payer: Self-pay | Admitting: Family Medicine

## 2021-12-24 ENCOUNTER — Other Ambulatory Visit: Payer: Self-pay

## 2021-12-24 ENCOUNTER — Ambulatory Visit (INDEPENDENT_AMBULATORY_CARE_PROVIDER_SITE_OTHER): Payer: 59

## 2021-12-24 ENCOUNTER — Ambulatory Visit (INDEPENDENT_AMBULATORY_CARE_PROVIDER_SITE_OTHER): Payer: 59 | Admitting: Family Medicine

## 2021-12-24 ENCOUNTER — Encounter: Payer: Self-pay | Admitting: Family Medicine

## 2021-12-24 VITALS — BP 140/83 | HR 70 | Ht 62.0 in | Wt 162.1 lb

## 2021-12-24 DIAGNOSIS — Z23 Encounter for immunization: Secondary | ICD-10-CM

## 2021-12-24 DIAGNOSIS — R748 Abnormal levels of other serum enzymes: Secondary | ICD-10-CM

## 2021-12-24 DIAGNOSIS — K5909 Other constipation: Secondary | ICD-10-CM

## 2021-12-24 DIAGNOSIS — I152 Hypertension secondary to endocrine disorders: Secondary | ICD-10-CM

## 2021-12-24 DIAGNOSIS — N184 Chronic kidney disease, stage 4 (severe): Secondary | ICD-10-CM

## 2021-12-24 DIAGNOSIS — S0510XA Contusion of eyeball and orbital tissues, unspecified eye, initial encounter: Secondary | ICD-10-CM

## 2021-12-24 DIAGNOSIS — H4030X0 Glaucoma secondary to eye trauma, unspecified eye, stage unspecified: Secondary | ICD-10-CM

## 2021-12-24 DIAGNOSIS — Z794 Long term (current) use of insulin: Secondary | ICD-10-CM

## 2021-12-24 DIAGNOSIS — E1122 Type 2 diabetes mellitus with diabetic chronic kidney disease: Secondary | ICD-10-CM | POA: Diagnosis not present

## 2021-12-24 DIAGNOSIS — R1115 Cyclical vomiting syndrome unrelated to migraine: Secondary | ICD-10-CM | POA: Diagnosis not present

## 2021-12-24 DIAGNOSIS — E1159 Type 2 diabetes mellitus with other circulatory complications: Secondary | ICD-10-CM

## 2021-12-24 DIAGNOSIS — F39 Unspecified mood [affective] disorder: Secondary | ICD-10-CM

## 2021-12-24 LAB — POCT GLYCOSYLATED HEMOGLOBIN (HGB A1C): HbA1c, POC (controlled diabetic range): 7.6 % — AB (ref 0.0–7.0)

## 2021-12-24 MED ORDER — LUBIPROSTONE 24 MCG PO CAPS: ORAL_CAPSULE | ORAL | 3 refills | Status: DC

## 2021-12-24 MED ORDER — MIRTAZAPINE 7.5 MG PO TABS: 7.5000 mg | ORAL_TABLET | Freq: Every day | ORAL | 1 refills | Status: DC

## 2021-12-24 MED ORDER — LORAZEPAM 2 MG PO TABS: 2.0000 mg | ORAL_TABLET | Freq: Four times a day (QID) | ORAL | 0 refills | Status: DC | PRN

## 2021-12-24 NOTE — Patient Instructions (Signed)
Your blood sugar A1c is up some to 7.6% but that is acceptable.  It is still much better than it was several months ago when it was 12.6%.  Keep taking the Lantus.   Let try increasing your Ativan to 2 mg when you have an attack.    I have sent in a prescription for the Brand Name Only Amitiza.  We will see if the insurance will cover it.    Start the mirtazapine for your anxiety.  We are starting at a low dose to make sure your stomach tolerates it.  It does help the stomach move better, so I am hoping to take care of two problems with one medicine.  We will talk about increasing the dose of mirtaZapine at your next office visit in a month.    I will see if it is possible to have a Home Health Nurse come to your home to give IV fluids and medications.  I will ask Ms Laurance Flatten, our social worker to see which home health agency they work with.

## 2021-12-25 ENCOUNTER — Encounter: Payer: Self-pay | Admitting: Family Medicine

## 2021-12-25 NOTE — Assessment & Plan Note (Signed)
Established problem Uncontrolled Recent visit to ED, but patient reports weight was too long. She has difficulty getting down lorazepam tablets with attacks.  She often is unable to dissolve talbet under her tongue because her mouth is so dry.  Often the pill comes up with spitting retching.   Often unable to keep oxycodone down, so she will not take it.  She does take the phenergan supporistories with variable effectiveness.   Will try trial of Lorazepam to 2 mg tablet prn with goal of getting some absorption. Continue phenergan suppository and prn oxycodone.

## 2021-12-25 NOTE — Assessment & Plan Note (Signed)
Established problem Well Controlled. No signs of complications, medication side effects, or red flags. Continue current medications and other regiments.  

## 2021-12-25 NOTE — Assessment & Plan Note (Signed)
Lab Results  Component Value Date   HGBA1C 7.6 (A) 12/24/2021   Established problem Well Controlled. No signs of complications, medication side effects, or red flags. Continue Lantus 20 units daily.  Semaglutide and Humalog made her nauseous.  She has stopped both.
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
## 2021-12-25 NOTE — Progress Notes (Signed)
[name] [name] is alone Sources of clinical information for visit is/are patient. Nursing assessment for this office visit was reviewed with the patient for accuracy and revision.     Previous Report(s) Reviewed: 1 ophth notes  Depression screen PHQ 2/9 12/24/2021  Decreased Interest 1  Down, Depressed, Hopeless 1  PHQ - 2 Score 2  Altered sleeping 3  Tired, decreased energy 0  Change in appetite 3  Feeling bad or failure about yourself  0  Trouble concentrating 0  Moving slowly or fidgety/restless 0  Suicidal thoughts 0  PHQ-9 Score 8  Difficult doing work/chores Somewhat difficult  Some recent data might be hidden   [place] Office Visit from 12/24/2021 in [place] Office Visit from 09/24/2021 in [place] Office Visit from 09/16/2021 in [place]  Thoughts that you would be better off dead, or of hurting yourself in some way Not at all Not at all Not at all  PHQ-9 Total Score 8 6 8        Fall Risk  09/16/2021 06/05/2020 11/16/2018 07/28/2018 01/05/2018  Falls in the past year? 0 1 0 No No  Number falls in past yr: 0 0 0 - -  Injury with Fall? 0 1 0 - -  Risk for fall due to : - - - - -  Risk for fall due to: Comment - - - - -    PHQ9 SCORE ONLY 12/24/2021 09/24/2021 09/16/2021  PHQ-9 Total Score 8 6 8     Adult vaccines due  Topic Date Due   TETANUS/TDAP  09/29/2023    Health Maintenance Due  Topic Date Due   Zoster Vaccines- Shingrix (1 of 2) Never done   Pneumococcal Vaccine 97-37 Years old (2 - PCV) 01/20/2018   FOOT EXAM  07/10/2021      History/P.E. limitations: none  Adult vaccines due  Topic Date Due   TETANUS/TDAP  09/29/2023    Diabetes Health Maintenance Due  Topic Date Due   FOOT EXAM  07/10/2021   OPHTHALMOLOGY EXAM  01/06/2022   HEMOGLOBIN A1C  06/24/2022    Health Maintenance Due  Topic Date Due   Zoster Vaccines- Shingrix (1 of 2) Never done   Pneumococcal  Vaccine 77-31 Years old (2 - PCV) 01/20/2018   FOOT EXAM  07/10/2021     Chief Complaint  Patient presents with   Follow-up

## 2021-12-29 ENCOUNTER — Other Ambulatory Visit: Payer: Self-pay | Admitting: Family Medicine

## 2021-12-29 ENCOUNTER — Telehealth: Payer: Self-pay

## 2021-12-29 DIAGNOSIS — R1115 Cyclical vomiting syndrome unrelated to migraine: Secondary | ICD-10-CM

## 2021-12-29 NOTE — Telephone Encounter (Addendum)
Please advise patient. PCP is already working with Education officer, museum on [place]. I will place CCM order to further assist with this. Thanks.  NB: I was unable to certify her homebound for [facility] order place. I will defer to her PCP.  CCM referral placed to help out with this.

## 2021-12-29 NOTE — Telephone Encounter (Signed)
[name] Lull, nurse navigator, LVM on nurse line requesting home health services for patient. [name] Lull reports the patient would benefit from having a PRN [facility] RN for cyclic vomiting episodes.   Will forward to PCP to place home health order if appropriate.

## 2021-12-30 NOTE — Telephone Encounter (Signed)
Spoke with patient informed of note below. Salvatore Marvel, CMA

## 2021-12-31 ENCOUNTER — Ambulatory Visit: Payer: Self-pay

## 2021-12-31 NOTE — Chronic Care Management (AMB) (Signed)
°  Care Management  Collaboration  Note  12/31/2021 Name: [name] [name] MRN: [id number] DOB: [birth year]  [name] [name] is a 56 y.o. year old female who is a primary care patient of McDiarmid, Blane Ohara, MD. The CCM team was consulted for Collaboration  reference chronic disease management and or care coordination needs.    Assessment: CM RN collaborated with Care Guide regarding [place] referral. CM RN does not conduct home health referrals they are done by the referral coordinator. Conducted brief assessment, recommendations and relevant information discussed.   Intervention: Patient was not interviewed or contacted during this encounter.     Follow up Plan: No follow up scheduled with CCM team at this time   [place], BSN, [place] Phone: [phone] I Fax: [phone]

## 2022-01-04 ENCOUNTER — Other Ambulatory Visit: Payer: Self-pay | Admitting: Family Medicine

## 2022-01-04 DIAGNOSIS — I10 Essential (primary) hypertension: Secondary | ICD-10-CM

## 2022-01-06 ENCOUNTER — Ambulatory Visit
Admission: RE | Admit: 2022-01-06 | Discharge: 2022-01-06 | Disposition: A | Discharge status: Home / Self Care | Payer: 59 | Source: Ambulatory Visit | Attending: Family Medicine | Admitting: Family Medicine

## 2022-01-06 ENCOUNTER — Other Ambulatory Visit: Payer: Self-pay | Admitting: *Deleted

## 2022-01-06 DIAGNOSIS — Z1231 Encounter for screening mammogram for malignant neoplasm of breast: Secondary | ICD-10-CM

## 2022-01-07 MED ORDER — OXYCODONE HCL 5 MG PO TABS: 5.0000 mg | ORAL_TABLET | Freq: Four times a day (QID) | ORAL | 0 refills | Status: DC | PRN

## 2022-01-13 ENCOUNTER — Other Ambulatory Visit: Payer: Self-pay | Admitting: Family Medicine

## 2022-01-13 DIAGNOSIS — L989 Disorder of the skin and subcutaneous tissue, unspecified: Secondary | ICD-10-CM

## 2022-01-14 ENCOUNTER — Other Ambulatory Visit: Payer: Self-pay

## 2022-01-14 NOTE — Telephone Encounter (Signed)
Opened in error.   Gearl Kimbrough C Kristene Liberati, RN  

## 2022-01-26 ENCOUNTER — Other Ambulatory Visit: Payer: Self-pay

## 2022-01-26 ENCOUNTER — Encounter: Payer: Self-pay | Admitting: Family Medicine

## 2022-01-26 ENCOUNTER — Ambulatory Visit (INDEPENDENT_AMBULATORY_CARE_PROVIDER_SITE_OTHER): Payer: 59 | Admitting: Family Medicine

## 2022-01-26 VITALS — BP 173/98 | HR 97 | Ht 62.0 in | Wt 162.8 lb

## 2022-01-26 DIAGNOSIS — M545 Low back pain, unspecified: Secondary | ICD-10-CM

## 2022-01-26 DIAGNOSIS — Z794 Long term (current) use of insulin: Secondary | ICD-10-CM | POA: Insufficient documentation

## 2022-01-26 DIAGNOSIS — I10 Essential (primary) hypertension: Secondary | ICD-10-CM | POA: Diagnosis not present

## 2022-01-26 DIAGNOSIS — E1122 Type 2 diabetes mellitus with diabetic chronic kidney disease: Secondary | ICD-10-CM

## 2022-01-26 DIAGNOSIS — F39 Unspecified mood [affective] disorder: Secondary | ICD-10-CM

## 2022-01-26 DIAGNOSIS — E1143 Type 2 diabetes mellitus with diabetic autonomic (poly)neuropathy: Secondary | ICD-10-CM

## 2022-01-26 DIAGNOSIS — I152 Hypertension secondary to endocrine disorders: Secondary | ICD-10-CM

## 2022-01-26 DIAGNOSIS — K3184 Gastroparesis: Secondary | ICD-10-CM

## 2022-01-26 DIAGNOSIS — R1115 Cyclical vomiting syndrome unrelated to migraine: Secondary | ICD-10-CM | POA: Diagnosis not present

## 2022-01-26 DIAGNOSIS — N184 Chronic kidney disease, stage 4 (severe): Secondary | ICD-10-CM

## 2022-01-26 DIAGNOSIS — E1159 Type 2 diabetes mellitus with other circulatory complications: Secondary | ICD-10-CM

## 2022-01-26 MED ORDER — PREDNISONE 20 MG PO TABS: 40.0000 mg | ORAL_TABLET | Freq: Every day | ORAL | 0 refills | Status: DC

## 2022-01-26 MED ORDER — CARVEDILOL 12.5 MG PO TABS: 12.5000 mg | ORAL_TABLET | Freq: Two times a day (BID) | ORAL | 3 refills | Status: DC

## 2022-01-26 MED ORDER — PROMETHAZINE HCL 25 MG PO TABS: 25.0000 mg | ORAL_TABLET | Freq: Four times a day (QID) | ORAL | 99 refills | Status: DC | PRN

## 2022-01-26 MED ORDER — METHYLPREDNISOLONE SODIUM SUCC 125 MG IJ SOLR
40.0000 mg | Freq: Once | INTRAMUSCULAR | Status: AC
  Administered 2022-01-26: 40 mg via INTRAMUSCULAR

## 2022-01-26 NOTE — Progress Notes (Signed)
[name] [name] is alone Sources of clinical information for visit is/are patient. Nursing assessment for this office visit was reviewed with the patient for accuracy and revision.     Previous Report(s) Reviewed: none  Depression screen PHQ 2/9 12/24/2021  Decreased Interest 1  Down, Depressed, Hopeless 1  PHQ - 2 Score 2  Altered sleeping 3  Tired, decreased energy 0  Change in appetite 3  Feeling bad or failure about yourself  0  Trouble concentrating 0  Moving slowly or fidgety/restless 0  Suicidal thoughts 0  PHQ-9 Score 8  Difficult doing work/chores Somewhat difficult  Some recent data might be hidden   [place] Office Visit from 12/24/2021 in [place] Office Visit from 09/24/2021 in [place] Office Visit from 09/16/2021 in [place]  Thoughts that you would be better off dead, or of hurting yourself in some way Not at all Not at all Not at all  PHQ-9 Total Score 8 6 8        Fall Risk  09/16/2021 06/05/2020 11/16/2018 07/28/2018 01/05/2018  Falls in the past year? 0 1 0 No No  Number falls in past yr: 0 0 0 - -  Injury with Fall? 0 1 0 - -  Risk for fall due to : - - - - -  Risk for fall due to: Comment - - - - -    PHQ9 SCORE ONLY 12/24/2021 09/24/2021 09/16/2021  PHQ-9 Total Score 8 6 8     Adult vaccines due  Topic Date Due   TETANUS/TDAP  09/29/2023    Health Maintenance Due  Topic Date Due   Zoster Vaccines- Shingrix (1 of 2) Never done   FOOT EXAM  07/10/2021   OPHTHALMOLOGY EXAM  01/06/2022      History/P.E. limitations: none  Adult vaccines due  Topic Date Due   TETANUS/TDAP  09/29/2023    Diabetes Health Maintenance Due  Topic Date Due   FOOT EXAM  07/10/2021   OPHTHALMOLOGY EXAM  01/06/2022   HEMOGLOBIN A1C  06/24/2022    Health Maintenance Due  Topic Date Due   Zoster Vaccines- Shingrix (1 of 2) Never done   FOOT EXAM  07/10/2021   OPHTHALMOLOGY EXAM  01/06/2022      Chief Complaint  Patient presents with   Back Pain

## 2022-01-26 NOTE — Assessment & Plan Note (Signed)
Established problem. Stable. Patient has been using her abortive oxycodone for her low back pain.  Patient educated that the oxycodone is only for [facility] flares.

## 2022-01-26 NOTE — Assessment & Plan Note (Signed)
Established problem. Stable.  No signs of complications, medication side effects, or red flags. Continue current medications and other regiments.  

## 2022-01-26 NOTE — Assessment & Plan Note (Signed)
Established problem PHQ9 SCORE ONLY 12/24/2021 09/24/2021 09/16/2021  PHQ-9 Total Score 8 6 8    Stable Contineu mirtazapine 7.5 mg

## 2022-01-26 NOTE — Patient Instructions (Signed)
Sacroiliac Joint Dysfunction Sacroiliac joint dysfunction is a condition that causes inflammation on one or both sides of the sacroiliac (SI) joint. The SI joint is the joint between two bones of the pelvis called the sacrum and the ilium. The sacrum is the bone at the base of the spine. The ilium is the large bone that forms the hip. This condition causes deep aching or burning pain in the low back. In some cases, the pain may also spread into one or both buttocks, hips, or thighs. What are the causes? This condition may be caused by: Pregnancy. During pregnancy, extra stress is put on the SI joints because the pelvis widens. Injury, such as: Injuries from car crashes. Sports-related injuries. Work-related injuries. Having one leg that is shorter than the other.  Sometimes, the cause of SI joint dysfunction is not known. What are the signs or symptoms? Symptoms of this condition include: Aching or burning pain in the lower back. The pain may also spread to other areas, such as: Buttocks. Groin. Thighs. Muscle spasms in or around the painful areas. Increased pain when standing, walking, running, stair climbing, bending, or lifting. How is this diagnosed? This condition is diagnosed with a physical exam and your medical history. During the exam, the health care provider may move one or both of your legs to different positions to check for pain. Various tests may be done to confirm the diagnosis, including: Imaging tests to look for other causes of pain. These may include: MRI. CT scan. Bone scan. Diagnostic injection. A numbing medicine is injected into the SI joint using a needle. If your pain is temporarily improved or stopped after the injection, this can indicate that SI joint dysfunction is the problem. How is this treated? Treatment depends on the cause and severity of your condition. Treatment options can be noninvasive and may include: Ice or heat applied to the lower back area  after an injury. This may help reduce pain and muscle spasms. Medicines to relieve pain or inflammation or to relax the muscles. Wearing a back brace (sacroiliac brace) to help support the joint while your back is healing. Physical therapy to increase muscle strength around the joint and flexibility at the joint. This may also involve learning proper body positions and ways of moving to relieve stress on the joint. Direct manipulation of the SI joint. Use of a device that provides electrical stimulation to help reduce pain at the joint. Other treatments may include: Injections of steroid medicine into the joint to reduce pain and swelling. Radiofrequency ablation. This treatment uses heat to burn away nerves that are carrying pain messages from the joint. Surgery to put in screws and plates that limit or prevent joint motion. This is rare. Follow these instructions at home: Medicines Take over-the-counter and prescription medicines only as told by your health care provider. Ask your health care provider if the medicine prescribed to you: Requires you to avoid driving or using machinery. Can cause constipation. You may need to take these actions to prevent or treat constipation: Drink enough fluid to keep your urine pale yellow. Take over-the-counter or prescription medicines. Eat foods that are high in fiber, such as beans, whole grains, and fresh fruits and vegetables. Limit foods that are high in fat and processed sugars, such as fried or sweet foods. If you have a brace: Wear the brace as told by your health care provider. Remove it only as told by your health care provider. Keep the brace clean. If the  brace is not waterproof: Do not let it get wet. Cover it with a watertight covering when you take a bath or a shower. Managing pain, stiffness, and swelling   Icing can help with pain and swelling. Heat may help with muscle tension or spasms. Ask your health care provider if you should  use ice or heat. If directed, put ice on the affected area: If you have a removable brace, remove it as told by your health care provider. Put ice in a plastic bag. Place a towel between your skin and the bag. Leave the ice on for 20 minutes, 2-3 times a day. Remove the ice if your skin turns bright red. This is very important. If you cannot feel pain, heat, or cold, you have a greater risk of damage to the area. If directed, apply heat to the affected area as often as told by your health care provider. Use the heat source that your health care provider recommends, such as a moist heat pack or a heating pad. Place a towel between your skin and the heat source. Leave the heat on for 20-30 minutes. Remove the heat if your skin turns bright red. This is especially important if you are unable to feel pain, heat, or cold. You may have a greater risk of getting burned. General instructions Rest as needed. Return to your normal activities as told by your health care provider. Ask your health care provider what activities are safe for you. Do exercises as told by your health care provider or physical therapist. Keep all follow-up visits. This is important. Contact a health care provider if: Your pain is not controlled with medicine. You have a fever. Your pain is getting worse. Get help right away if: You have weakness, numbness, or tingling in your legs or feet. You lose control of your bladder or bowels. Summary Sacroiliac (SI) joint dysfunction is a condition that causes inflammation on one or both sides of the SI joint. This condition causes deep aching or burning pain in the low back. In some cases, the pain may also spread into one or both buttocks, hips, or thighs. Treatment depends on the cause and severity of your condition. It may include medicines to reduce pain and swelling or to relax muscles. This information is not intended to replace advice given to you by your health care provider.  Make sure you discuss any questions you have with your health care provider. Document Revised: 04/24/2020 Document Reviewed: 04/24/2020 Elsevier Patient Education  [place].

## 2022-01-26 NOTE — Assessment & Plan Note (Signed)
Established problem. Stable. Reporting CBGs < 180 without hypoglycemic events Taking Lantus 20 units daily No signs of complications, medication side effects, or red flags. Continue current medications and other regiments.

## 2022-01-26 NOTE — Assessment & Plan Note (Addendum)
Established problem Uncontrolled Patient is experiencing low back pain currently. Suspect this is contributing to this elevation. Restart carvedilol 12.5 mg twice a day RETURN TO CLINIC 4 weeks to assess response.

## 2022-01-26 NOTE — Assessment & Plan Note (Signed)
Recurrent problem over last 5 to 10 years. Low back pain flared up last week. No acute trauma.  Working at [employer] - has not gone to work [employer] warm baths and oxycodone without relief.  No red flags findings DG [place] XR 2019 was unremarkable.   Eam with positive FABER four sign, tender bilateral sacro-iliac spaces,  Normal SLR bilateral.   5/5 ankle DF/PF Knee DTR 2+ bil  Assessment Nonspecific low back pain.  Possible SI joint involvement patient unable to afford $25 Physical Therapy session complaint of-pays.   Plan Solumedrol 40 mg intramuscular x one now Prednisone 40 mg daily for 5 days.  Exercises to mobilize SI joints.  If unimproved, patient may go to PM&R

## 2022-02-08 ENCOUNTER — Other Ambulatory Visit: Payer: Self-pay

## 2022-02-08 ENCOUNTER — Telehealth: Payer: Self-pay

## 2022-02-08 DIAGNOSIS — R1115 Cyclical vomiting syndrome unrelated to migraine: Secondary | ICD-10-CM

## 2022-02-08 MED ORDER — OXYCODONE HCL 5 MG PO TABS: 5.0000 mg | ORAL_TABLET | Freq: Four times a day (QID) | ORAL | 0 refills | Status: DC | PRN

## 2022-02-08 NOTE — Telephone Encounter (Signed)
Refilled oxycodone 5 mg tab, #35 tab  RF 0

## 2022-02-08 NOTE — Telephone Encounter (Signed)
Patient calls nurse line regarding new onset diarrhea. Patient reports that this started very early on Sunday morning. States that her "stomach feels upset."  Denies nausea, vomiting or fever.   States that she had coffee and a sausage and cheese muffin today. Advised patient to consume bland food and to increase PO fluids.   Patient will call back if symptoms worsen. Patient is also requesting additional recommendations from Dr. McDiarmid.   Please advise.   Talbot Grumbling, RN

## 2022-02-16 ENCOUNTER — Telehealth: Payer: Self-pay | Admitting: *Deleted

## 2022-02-16 NOTE — Telephone Encounter (Signed)
Patient calls requesting to have 1mg  lorazepam called in.  She states that the 2mg  is too much for her and makes her "sleepy and groggy"  She is unable to split the 2mg  pill in half because "it crumbles and I don't know how much I am getting".  She is requesting a script for 1mg  sent in.  To PCP. Christen Bame, CMA

## 2022-02-17 ENCOUNTER — Other Ambulatory Visit: Payer: Self-pay | Admitting: Family Medicine

## 2022-02-17 ENCOUNTER — Encounter (HOSPITAL_COMMUNITY): Payer: Self-pay

## 2022-02-17 ENCOUNTER — Emergency Department (HOSPITAL_COMMUNITY)
Admission: EM | Admit: 2022-02-17 | Discharge: 2022-02-17 | Disposition: A | Discharge status: Home / Self Care | Payer: 59 | Attending: Emergency Medicine | Admitting: Emergency Medicine

## 2022-02-17 ENCOUNTER — Ambulatory Visit (HOSPITAL_COMMUNITY)
Admission: RE | Admit: 2022-02-17 | Discharge: 2022-02-17 | Disposition: A | Discharge status: Home / Self Care | Payer: 59 | Source: Ambulatory Visit | Attending: Family Medicine | Admitting: Family Medicine

## 2022-02-17 ENCOUNTER — Other Ambulatory Visit: Payer: Self-pay

## 2022-02-17 VITALS — BP 201/109 | HR 91 | Temp 98.9°F | Resp 16

## 2022-02-17 DIAGNOSIS — R739 Hyperglycemia, unspecified: Secondary | ICD-10-CM | POA: Diagnosis not present

## 2022-02-17 DIAGNOSIS — I10 Essential (primary) hypertension: Secondary | ICD-10-CM

## 2022-02-17 DIAGNOSIS — R1013 Epigastric pain: Secondary | ICD-10-CM | POA: Diagnosis not present

## 2022-02-17 DIAGNOSIS — R1115 Cyclical vomiting syndrome unrelated to migraine: Secondary | ICD-10-CM

## 2022-02-17 DIAGNOSIS — N189 Chronic kidney disease, unspecified: Secondary | ICD-10-CM | POA: Diagnosis not present

## 2022-02-17 DIAGNOSIS — R61 Generalized hyperhidrosis: Secondary | ICD-10-CM | POA: Insufficient documentation

## 2022-02-17 DIAGNOSIS — Z20822 Contact with and (suspected) exposure to covid-19: Secondary | ICD-10-CM | POA: Diagnosis not present

## 2022-02-17 DIAGNOSIS — R112 Nausea with vomiting, unspecified: Secondary | ICD-10-CM | POA: Diagnosis present

## 2022-02-17 DIAGNOSIS — I129 Hypertensive chronic kidney disease with stage 1 through stage 4 chronic kidney disease, or unspecified chronic kidney disease: Secondary | ICD-10-CM | POA: Insufficient documentation

## 2022-02-17 LAB — CBC WITH DIFFERENTIAL/PLATELET
Abs Immature Granulocytes: 0.01 10*3/uL (ref 0.00–0.07)
Basophils Absolute: 0 10*3/uL (ref 0.0–0.1)
Basophils Relative: 0 %
Eosinophils Absolute: 0 10*3/uL (ref 0.0–0.5)
Eosinophils Relative: 1 %
HCT: 36.5 % (ref 36.0–46.0)
Hemoglobin: 12.3 g/dL (ref 12.0–15.0)
Immature Granulocytes: 0 %
Lymphocytes Relative: 38 %
Lymphs Abs: 2.1 10*3/uL (ref 0.7–4.0)
MCH: 31.1 pg (ref 26.0–34.0)
MCHC: 33.7 g/dL (ref 30.0–36.0)
MCV: 92.4 fL (ref 80.0–100.0)
Monocytes Absolute: 0.5 10*3/uL (ref 0.1–1.0)
Monocytes Relative: 8 %
Neutro Abs: 3 10*3/uL (ref 1.7–7.7)
Neutrophils Relative %: 53 %
Platelets: 228 10*3/uL (ref 150–400)
RBC: 3.95 MIL/uL (ref 3.87–5.11)
RDW: 14.4 % (ref 11.5–15.5)
WBC: 5.7 10*3/uL (ref 4.0–10.5)
nRBC: 0 % (ref 0.0–0.2)

## 2022-02-17 LAB — URINALYSIS, ROUTINE W REFLEX MICROSCOPIC
Bilirubin Urine: NEGATIVE
Glucose, UA: 50 mg/dL — AB
Ketones, ur: 5 mg/dL — AB
Leukocytes,Ua: NEGATIVE
Nitrite: NEGATIVE
Protein, ur: 100 mg/dL — AB
Specific Gravity, Urine: 1.011 (ref 1.005–1.030)
pH: 6 (ref 5.0–8.0)

## 2022-02-17 LAB — COMPREHENSIVE METABOLIC PANEL
ALT: 9 U/L (ref 0–44)
AST: 17 U/L (ref 15–41)
Albumin: 4.2 g/dL (ref 3.5–5.0)
Alkaline Phosphatase: 94 U/L (ref 38–126)
Anion gap: 11 (ref 5–15)
BUN: 36 mg/dL — ABNORMAL HIGH (ref 6–20)
CO2: 21 mmol/L — ABNORMAL LOW (ref 22–32)
Calcium: 9.8 mg/dL (ref 8.9–10.3)
Chloride: 107 mmol/L (ref 98–111)
Creatinine, Ser: 2.82 mg/dL — ABNORMAL HIGH (ref 0.44–1.00)
GFR, Estimated: 19 mL/min — ABNORMAL LOW (ref >60)
Glucose, Bld: 158 mg/dL — ABNORMAL HIGH (ref 70–99)
Potassium: 4.9 mmol/L (ref 3.5–5.1)
Sodium: 139 mmol/L (ref 135–145)
Total Bilirubin: 0.3 mg/dL (ref 0.3–1.2)
Total Protein: 7.5 g/dL (ref 6.5–8.1)

## 2022-02-17 LAB — LIPASE, BLOOD: Lipase: 93 U/L — ABNORMAL HIGH (ref 11–51)

## 2022-02-17 LAB — RESP PANEL BY RT-PCR (FLU A&B, COVID) ARPGX2
Influenza A by PCR: NEGATIVE
Influenza B by PCR: NEGATIVE
SARS Coronavirus 2 by RT PCR: NEGATIVE

## 2022-02-17 LAB — CBG MONITORING, ED: Glucose-Capillary: 151 mg/dL — ABNORMAL HIGH (ref 70–99)

## 2022-02-17 MED ORDER — SODIUM CHLORIDE 0.9 % IV SOLN: 12.5000 mg | Freq: Once | INTRAVENOUS | Status: DC

## 2022-02-17 MED ORDER — ONDANSETRON HCL 4 MG/2ML IJ SOLN
4.0000 mg | Freq: Once | INTRAMUSCULAR | Status: AC
  Administered 2022-02-17: 4 mg via INTRAVENOUS
  Filled 2022-02-17: qty 2

## 2022-02-17 MED ORDER — LORAZEPAM 1 MG PO TABS
1.0000 mg | ORAL_TABLET | Freq: Four times a day (QID) | ORAL | 0 refills | Status: DC | PRN
Start: 2022-02-17 — End: 2022-03-24

## 2022-02-17 MED ORDER — SODIUM CHLORIDE 0.9 % IV BOLUS
1000.0000 mL | Freq: Once | INTRAVENOUS | Status: AC
  Administered 2022-02-17: 1000 mL via INTRAVENOUS

## 2022-02-17 MED ORDER — METOCLOPRAMIDE HCL 5 MG/ML IJ SOLN
10.0000 mg | Freq: Once | INTRAMUSCULAR | Status: AC
Start: 2022-02-17 — End: 2022-02-17
  Administered 2022-02-17: 10 mg via INTRAVENOUS
  Filled 2022-02-17: qty 2

## 2022-02-17 MED ORDER — HYDROMORPHONE HCL 1 MG/ML IJ SOLN
0.5000 mg | Freq: Once | INTRAMUSCULAR | Status: AC
  Administered 2022-02-17: 0.5 mg via INTRAVENOUS
  Filled 2022-02-17: qty 1

## 2022-02-17 MED ORDER — LORAZEPAM 2 MG/ML IJ SOLN
1.0000 mg | Freq: Once | INTRAMUSCULAR | Status: AC
Start: 2022-02-17 — End: 2022-02-17
  Administered 2022-02-17: 1 mg via INTRAVENOUS
  Filled 2022-02-17: qty 1

## 2022-02-17 MED ORDER — ONDANSETRON HCL 4 MG/2ML IJ SOLN
4.0000 mg | Freq: Once | INTRAMUSCULAR | Status: DC
  Filled 2022-02-17: qty 2

## 2022-02-17 NOTE — ED Triage Notes (Signed)
Pt reports vomiting and headaches x 3 days.   States she has not been taking her BP medicine because she has not been able to tolerate it. States she has been feeling very tired and has stomach pain.

## 2022-02-17 NOTE — ED Provider Notes (Signed)
[facility] EMERGENCY DEPARTMENT Provider Note   CSN: [id number] Arrival date & time: 02/17/22  1247     History  Chief Complaint  Patient presents with   Emesis    [name] [name] is a 56 y.o. female.  Patient seen at urgent care earlier today for symptoms of known cyclic vomiting syndrome. Current episode onset three days ago. Patient has not been able to take her medications due to persistent nausea and vomiting. Patient complaining of epigastric abdominal discomfort, similar to prior episodes. Coordination of care notes identify treatment plan for recurrent episodes. Patient sent to the ED to carry out plan.  The history is provided by the patient and medical records.  Emesis Severity:  Severe Duration:  3 days Timing:  Intermittent Quality:  Stomach contents Chronicity:  Chronic Associated symptoms: abdominal pain   Risk factors: diabetes       Home Medications Prior to Admission medications   Medication Sig Start Date End Date Taking? Authorizing Provider  blood glucose meter kit and supplies KIT Dispense based on patient and insurance preference. Use up to four times daily as directed. (FOR ICD-9 250.00, 250.01). 08/28/20   McDiarmid, Blane Ohara, MD  carvedilol (COREG) 12.5 MG tablet Take 1 tablet (12.5 mg total) by mouth 2 (two) times daily with a meal. 01/26/22   McDiarmid, Blane Ohara, MD  diltiazem (CARDIZEM CD) 180 MG 24 hr capsule TAKE 1 CAPSULE(180 MG) BY MOUTH DAILY 08/10/21   McDiarmid, Blane Ohara, MD  glucose blood (ONETOUCH ULTRA) test strip Please use to check blood sugar up to 4 times daily. 03/17/21   McDiarmid, Blane Ohara, MD  insulin glargine (LANTUS SOLOSTAR) 100 UNIT/ML Solostar Pen Inject 20 Units into the skin daily. 05/14/21 01/26/22  McDiarmid, Blane Ohara, MD  Insulin Pen Needle (NOVOFINE PLUS PEN NEEDLE) 32G X 4 MM MISC [place] injection twice per day. 07/16/21   McDiarmid, Blane Ohara, MD  Lancets [facility] ULTRASOFT) lancets Please use to check blood sugar up to  four times daily. 03/17/21   McDiarmid, Blane Ohara, MD  LORazepam (ATIVAN) 1 MG tablet Take 1 tablet (1 mg total) by mouth every 6 (six) hours as needed for anxiety. 02/17/22   McDiarmid, Blane Ohara, MD  lubiprostone (AMITIZA) 24 MCG capsule TAKE 1 CAPSULE(24 MCG) BY MOUTH TWICE DAILY WITH A MEAL 12/24/21   McDiarmid, Blane Ohara, MD  mirtazapine (REMERON) 7.5 MG tablet Take 1 tablet (7.5 mg total) by mouth at bedtime. 12/24/21   McDiarmid, Blane Ohara, MD  oxyCODONE (OXY IR/ROXICODONE) 5 MG immediate release tablet Take 1 tablet (5 mg total) by mouth every 6 (six) hours as needed for severe pain. 02/08/22   McDiarmid, Blane Ohara, MD  predniSONE (DELTASONE) 20 MG tablet Take 2 tablets (40 mg total) by mouth daily. Take for 7 days 01/26/22   McDiarmid, Blane Ohara, MD  promethazine (PHENERGAN) 25 MG suppository Place 1 suppository (25 mg total) rectally every 6 (six) hours as needed for nausea or vomiting. 12/07/21   McDiarmid, Blane Ohara, MD  promethazine (PHENERGAN) 25 MG tablet Take 1 tablet (25 mg total) by mouth every 6 (six) hours as needed for nausea or vomiting. 01/26/22   McDiarmid, Blane Ohara, MD  QUEtiapine (SEROQUEL) 50 MG tablet TAKE 1 TABLET(50 MG) BY MOUTH AT BEDTIME 01/14/22   McDiarmid, Blane Ohara, MD      Allergies    Bee venom, Tegaderm ag mesh [silver], Acetaminophen, Novolog [insulin aspart], Semaglutide, Statins, Flexeril [cyclobenzaprine], Lyrica [pregabalin], Nsaids, Ozempic (0.25 or  0.5 mg-dose) [semaglutide(0.25 or 0.12m-dos)], Tape, Ceftriaxone, and Erythromycin    Review of Systems   Review of Systems  Gastrointestinal:  Positive for abdominal pain and vomiting.  All other systems reviewed and are negative.  Physical Exam Updated Vital Signs BP (!) 200/88 (BP Location: Left Arm)    Pulse 79    Temp 99.1 F (37.3 C) (Oral)    Resp (!) 22    SpO2 98%  Physical Exam Constitutional:      Appearance: She is diaphoretic.  HENT:     Head: Normocephalic.     Nose: Nose normal.  Eyes:     Conjunctiva/sclera:  Conjunctivae normal.  Cardiovascular:     Rate and Rhythm: Normal rate.  Pulmonary:     Effort: Pulmonary effort is normal.  Abdominal:     Palpations: Abdomen is soft.     Tenderness: There is abdominal tenderness.  Musculoskeletal:        General: Normal range of motion.  Skin:    General: Skin is warm.  Neurological:     Mental Status: She is alert and oriented to person, place, and time.  Psychiatric:        Mood and Affect: Mood normal.        Behavior: Behavior normal.    ED Results / Procedures / Treatments   Labs (all labs ordered are listed, but only abnormal results are displayed) Labs Reviewed  COMPREHENSIVE METABOLIC PANEL - Abnormal; Notable for the following components:      Result Value   CO2 21 (*)    Glucose, Bld 158 (*)    BUN 36 (*)    Creatinine, Ser 2.82 (*)    GFR, Estimated 19 (*)    All other components within normal limits  LIPASE, BLOOD - Abnormal; Notable for the following components:   Lipase 93 (*)    All other components within normal limits  URINALYSIS, ROUTINE W REFLEX MICROSCOPIC - Abnormal; Notable for the following components:   Glucose, UA 50 (*)    Hgb urine dipstick SMALL (*)    Ketones, ur 5 (*)    Protein, ur 100 (*)    Bacteria, UA RARE (*)    All other components within normal limits  CBG MONITORING, ED - Abnormal; Notable for the following components:   Glucose-Capillary 151 (*)    All other components within normal limits  RESP PANEL BY RT-PCR (FLU A&B, COVID) ARPGX2  CBC WITH DIFFERENTIAL/PLATELET    EKG None  Radiology No results found.  Procedures Procedures    Medications Ordered in ED Medications  sodium chloride 0.9 % bolus 1,000 mL (1,000 mLs Intravenous Bolus 02/17/22 1616)  HYDROmorphone (DILAUDID) injection 0.5 mg (0.5 mg Intravenous Given 02/17/22 1618)  LORazepam (ATIVAN) injection 1 mg (1 mg Intravenous Given 02/17/22 1616)  ondansetron (ZOFRAN) injection 4 mg (4 mg Intravenous Given 02/17/22 1734)     ED Course/ Medical Decision Making/ A&P    6:51 PM Patient feeling better after IV fluids and medications. Requesting ice chips.  Patient tolerating oral intake without difficulty.  Patient to return to taking prescribed medications for hypertension.                         Medical Decision Making Risk Prescription drug management.  Patient with known CKD. Renal labs within baseline.  Patient with nausea and vomiting consistent with known cyclic vomiting syndrome. Considered but low risk for SBO. No indication of DKA. Renal labs  consistent with known CKD, stable. Patient will be discharged home and follow-up with PCP.        Final Clinical Impression(s) / ED Diagnoses Final diagnoses:  Cyclic vomiting syndrome    Rx / DC Orders ED Discharge Orders     None         Etta Quill, NP 02/18/22 [id number]    Lajean Saver, MD 02/18/22 [phone]

## 2022-02-17 NOTE — ED Notes (Signed)
Called Charge RN to inform about patient.

## 2022-02-17 NOTE — ED Notes (Signed)
Patient is being discharged from the Urgent Care and sent to the Emergency Department via [facility] . Per Willeen Cass NP, patient is in need of higher level of care due to cyclic vomiting syndrome, hypertension. Patient is aware and verbalizes understanding of plan of care.  Vitals:   02/17/22 1148  BP: (!) 201/109  Pulse: 91  Resp: 16  Temp: 98.9 F (37.2 C)  SpO2: 98%

## 2022-02-17 NOTE — ED Triage Notes (Signed)
Patient BIB [facility] from Urgent Care for cyclic vomiting. Patient is prescribed ativan, dose was recently increased by patient does not like taking it because it makes her drowsy. Patient complains of nausea, is not actively vomiting. Patient alert, oriented, and in no apparent distress at this time.

## 2022-02-17 NOTE — Discharge Instructions (Signed)
Please go to the ER for further care.

## 2022-02-17 NOTE — ED Notes (Signed)
Pt states she had chest pain yesterday.

## 2022-02-17 NOTE — Telephone Encounter (Signed)
Patient returns call to nurse line regarding previous concern. Patient states that due to sleepiness she has missed her BP medications for the last two nights. Patient now reports headache and nausea. Patient states that she last checked BP on Monday and it was 120/62. Patient is unable to check BP medication at this time.   Patient states that she has not been able to take anything for headache due to nausea. Denies blurry vision, CP, or SHOB. Recommended that patient be evaluated today due to severe headache and possible elevated BP. We do not have any openings for today, assisted patient in scheduling appointment for [name] today at 12 pm.   Talbot Grumbling, RN

## 2022-02-17 NOTE — Discharge Instructions (Addendum)
Please follow-up with your PCP as discussed.

## 2022-02-17 NOTE — Telephone Encounter (Signed)
Change in Lorazepam tablet strength and Rx sig. Sig: Lorazepam 1 mg q 6 hrs nausea & vomiting (cyclic vomiting) Disp: #88 tab Rx sent into pharmacy

## 2022-02-17 NOTE — ED Provider Triage Note (Signed)
Emergency Medicine Provider Triage Evaluation Note  [name] [name] , a 56 y.o. female  was evaluated in triage.  Pt complains of epigastric abdominal pain, nausea and dry heaving.  Patient reports history of gastroparesis and cyclic vomiting syndrome.  States that this is consistent with previous episodes she has had in the past.  States that she normally receives Ativan, Phenergan, and Dilaudid for her symptoms.  Patient reports that she is having nausea and dry heaving of the last 3 days.  Patient has been unable to tolerate any p.o. intake over this time including her medications.  Additionally patient complains of a generalized headache and lumbar back pain.    Patient reports previous history of abdominal surgery including oophorectomy x1 and hysterectomy.  Review of Systems  Positive: Nausea, dry heaving, epigastric abdominal pain Negative: Dysuria, hematuria, urinary urgency,   Physical Exam  BP (!) 200/88 (BP Location: Left Arm)    Pulse 79    Temp 99.1 F (37.3 C) (Oral)    Resp (!) 22    SpO2 98%  Gen:   Awake, no distress   Resp:  Normal effort  MSK:   Moves extremities without difficulty  Other:  Abdomen soft, nondistended, tenderness to epigastric area.  Medical Decision Making  Medically screening exam initiated at 1:11 PM.  Appropriate orders placed.  [name] was informed that the remainder of the evaluation will be completed by another provider, this initial triage assessment does not replace that evaluation, and the importance of remaining in the ED until their evaluation is complete.     Loni Beckwith, [place] 02/17/22 1313

## 2022-02-17 NOTE — ED Notes (Signed)
Pt refused the zofran and stated it will make her sick. Pt is sleeping at this time, will continue to monitor.

## 2022-02-17 NOTE — ED Notes (Signed)
;  pt stated she doesn't have urge to void, has label specimen cup
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
## 2022-02-17 NOTE — ED Notes (Signed)
Pt reports she feels better.

## 2022-02-17 NOTE — ED Notes (Signed)
Pt is refusing covid testing

## 2022-02-17 NOTE — Progress Notes (Signed)
Change in Lorazepam tablet strength and Rx sig. Sig: Lorazepam 1 mg q 6 hrs nausea & vomiting (cyclic vomiting) Disp: #31 tab

## 2022-02-17 NOTE — ED Notes (Signed)
[facility] called to transport patient to ED via EMS, emergent per Medical Provider.

## 2022-02-23 LAB — HM DIABETES EYE EXAM

## 2022-03-04 ENCOUNTER — Ambulatory Visit: Payer: 59 | Admitting: Family Medicine

## 2022-03-11 ENCOUNTER — Ambulatory Visit (INDEPENDENT_AMBULATORY_CARE_PROVIDER_SITE_OTHER): Payer: 59 | Admitting: Family Medicine

## 2022-03-11 ENCOUNTER — Encounter: Payer: Self-pay | Admitting: Family Medicine

## 2022-03-11 ENCOUNTER — Other Ambulatory Visit: Payer: Self-pay

## 2022-03-11 VITALS — BP 170/60 | HR 61 | Ht 62.0 in | Wt 165.1 lb

## 2022-03-11 DIAGNOSIS — K5909 Other constipation: Secondary | ICD-10-CM

## 2022-03-11 DIAGNOSIS — N184 Chronic kidney disease, stage 4 (severe): Secondary | ICD-10-CM

## 2022-03-11 DIAGNOSIS — R1115 Cyclical vomiting syndrome unrelated to migraine: Secondary | ICD-10-CM | POA: Diagnosis not present

## 2022-03-11 DIAGNOSIS — E113293 Type 2 diabetes mellitus with mild nonproliferative diabetic retinopathy without macular edema, bilateral: Secondary | ICD-10-CM

## 2022-03-11 DIAGNOSIS — E1122 Type 2 diabetes mellitus with diabetic chronic kidney disease: Secondary | ICD-10-CM | POA: Diagnosis not present

## 2022-03-11 DIAGNOSIS — L989 Disorder of the skin and subcutaneous tissue, unspecified: Secondary | ICD-10-CM

## 2022-03-11 DIAGNOSIS — Z794 Long term (current) use of insulin: Secondary | ICD-10-CM | POA: Diagnosis not present

## 2022-03-11 DIAGNOSIS — I152 Hypertension secondary to endocrine disorders: Secondary | ICD-10-CM

## 2022-03-11 DIAGNOSIS — E1159 Type 2 diabetes mellitus with other circulatory complications: Secondary | ICD-10-CM

## 2022-03-11 LAB — POCT GLYCOSYLATED HEMOGLOBIN (HGB A1C): HbA1c, POC (controlled diabetic range): 8.1 % — AB (ref 0.0–7.0)

## 2022-03-12 ENCOUNTER — Encounter: Payer: Self-pay | Admitting: Family Medicine

## 2022-03-12 MED ORDER — DILTIAZEM HCL ER COATED BEADS 180 MG PO CP24: 180.0000 mg | ORAL_CAPSULE | Freq: Two times a day (BID) | ORAL | 1 refills | Status: DC

## 2022-03-12 MED ORDER — OXYCODONE HCL 5 MG PO TABS: 5.0000 mg | ORAL_TABLET | Freq: Four times a day (QID) | ORAL | 0 refills | Status: DC | PRN

## 2022-03-12 MED ORDER — MUPIROCIN CALCIUM 2 % EX CREA: 1.0000 "application " | TOPICAL_CREAM | Freq: Two times a day (BID) | CUTANEOUS | 5 refills | Status: DC

## 2022-03-12 NOTE — Assessment & Plan Note (Signed)
Established problem ?Uncontrolled on Diltiazem CD 180 mg at bedtime and Carvedilol 6.25 twice a day  (HR in 60s) ?Start: Increase Diltizem CD 180 mg twice a day (patient preference rather than once a day) ? ?Continue: carvedilol 6.25 mg twice a day ? ?RTC 2-4 weeks for BP recheck ? ?

## 2022-03-12 NOTE — Assessment & Plan Note (Signed)
Seen recently by Dr R. Manuella Ghazi ([facility]) ?He plans to continue monitoring the bilateral,mild non-proliferative retinopathy with macular edema ?

## 2022-03-12 NOTE — Assessment & Plan Note (Signed)
Established problem Well Controlled. No signs of complications, medication side effects, or red flags. Continue current medications and other regiments.  

## 2022-03-12 NOTE — Assessment & Plan Note (Addendum)
Lab Results  ?Component Value Date  ? HGBA1C 8.1 (A) 03/11/2022  ? HGBA1C 7.6 (A) 12/24/2021  ? HGBA1C 6.7 (H) 08/07/2021  ? ?Lab Results  ?Component Value Date  ? MICROALBUR 13.96 (H) 09/27/2013  ? LDLCALC 120 (H) 08/03/2016  ? CREATININE 2.82 (H) 02/17/2022  ?Patient told by [facility] nurse that 6.8% in February ?Not checking home CBGs because battery id dead ?DIABETES GOALS:  ?Glucose Control: Target HgA1C: < 7.0  ?ACE/ARB (if BP >140/80 or proteinuria): no ?STATIN: intolerant ? ?LAST EYE EXAM (Annually): 02/23/22  ?BMI >25 (nutrition counseling):  ?Wt Readings from Last 3 Encounters:  ?03/11/22 165 lb 2 oz (74.9 kg)  ?01/26/22 162 lb 12.8 oz (73.8 kg)  ?12/24/21 162 lb 2 oz (73.5 kg)  ? ?Exercise (1103mn/week): no  ?If smoker, encourage Tobacco Cessation: Smoking 7 cig/day ?Vaccinations: needs Shingrix ? ?A/ Decline in glucemic control with A1c greater than goal of < 7.5% ?       - Seroquel may be contributing to some insulin resistance ?P/ Increase Lantus to 23 units ?    Consider linagliptin ?     ?    Possible low dose dapagliflozin 2.5 - 5 mg next office visit  ?

## 2022-03-12 NOTE — Progress Notes (Signed)
Diagnosis OU mild nonproliferative diabetic retinopathy with macular edema ?                 Bilateral pseudophakia ?                 Glaucoma OS associated with ocular trauma, mild stage.  ?

## 2022-03-12 NOTE — Assessment & Plan Note (Signed)
Patient reports being told by [facility] RN that her skin conition looks like [place] Disease. ?Would be very unusual in patient because of her sex and skin pigmentation ?Told patient I would defer such a diagnosis to a dermatologist. ?She is to see a dermatologist for initial consultation next month.  ? ?Refilled the mupirocin to use on her picking skin sores.   ?

## 2022-03-12 NOTE — Assessment & Plan Note (Signed)
Established problem. Stable.  No signs of complications, medication side effects, or red flags. Continue current medications and other regiments.  

## 2022-03-12 NOTE — Progress Notes (Signed)
[name] [name] is alone ?Sources of clinical information for visit is/are patient and recent ED visit notes. ?Nursing assessment for this office visit was reviewed with the patient for accuracy and revision.  ? ? ? ?Previous Report(s) Reviewed: Labs and recent ED visit notes  ?Depression screen [facility] 2/9 03/11/2022  ?Decreased Interest 0  ?Down, Depressed, Hopeless 0  ?PHQ - 2 Score 0  ?Altered sleeping 3  ?Tired, decreased energy 0  ?Change in appetite 3  ?Feeling bad or failure about yourself  0  ?Trouble concentrating 0  ?Moving slowly or fidgety/restless 0  ?Suicidal thoughts 0  ?PHQ-9 Score 6  ?Difficult doing work/chores -  ?Some recent data might be hidden  ? ?[place] Office Visit from 03/11/2022 in [place] Office Visit from 12/24/2021 in [place] Office Visit from 09/24/2021 in [place]  ?Thoughts that you would be better off dead, or of hurting yourself in some way Not at all Not at all Not at all  ?PHQ-9 Total Score '6 8 6  '$ ? ?  ?  ?Fall Risk  09/16/2021 06/05/2020 11/16/2018 07/28/2018 01/05/2018  ?Falls in the past year? 0 1 0 No No  ?Number falls in past yr: 0 0 0 - -  ?Injury with Fall? 0 1 0 - -  ?Risk for fall due to : - - - - -  ?Risk for fall due to: Comment - - - - -  ? ? ?PHQ9 SCORE ONLY 03/11/2022 12/24/2021 09/24/2021  ?PHQ-9 Total Score '6 8 6  '$ ? ? ?Adult vaccines due  ?Topic Date Due  ? TETANUS/TDAP  09/29/2023  ? ? ?Health Maintenance Due  ?Topic Date Due  ? Zoster Vaccines- Shingrix (1 of 2) Never done  ? FOOT EXAM  07/10/2021  ? OPHTHALMOLOGY EXAM  01/06/2022  ?  ? ? ?History/P.E. limitations: none ? ?Adult vaccines due  ?Topic Date Due  ? TETANUS/TDAP  09/29/2023  ?  ?Diabetes Health Maintenance Due  ?Topic Date Due  ? FOOT EXAM  07/10/2021  ? OPHTHALMOLOGY EXAM  01/06/2022  ? HEMOGLOBIN A1C  09/11/2022  ?  ?Health Maintenance Due  ?Topic Date Due  ? Zoster Vaccines- Shingrix (1 of 2) Never done  ? FOOT EXAM  07/10/2021   ? OPHTHALMOLOGY EXAM  01/06/2022  ?  ? ?Chief Complaint  ?Patient presents with  ? Follow-up  ?  ? ? ?

## 2022-03-15 ENCOUNTER — Other Ambulatory Visit: Payer: Self-pay | Admitting: Family Medicine

## 2022-03-15 DIAGNOSIS — R1115 Cyclical vomiting syndrome unrelated to migraine: Secondary | ICD-10-CM

## 2022-03-15 DIAGNOSIS — F39 Unspecified mood [affective] disorder: Secondary | ICD-10-CM

## 2022-03-17 ENCOUNTER — Other Ambulatory Visit: Payer: Self-pay | Admitting: Family Medicine

## 2022-03-17 DIAGNOSIS — E118 Type 2 diabetes mellitus with unspecified complications: Secondary | ICD-10-CM

## 2022-03-17 DIAGNOSIS — K3184 Gastroparesis: Secondary | ICD-10-CM

## 2022-03-17 DIAGNOSIS — E1142 Type 2 diabetes mellitus with diabetic polyneuropathy: Secondary | ICD-10-CM

## 2022-03-17 DIAGNOSIS — R1115 Cyclical vomiting syndrome unrelated to migraine: Secondary | ICD-10-CM

## 2022-03-24 ENCOUNTER — Other Ambulatory Visit: Payer: Self-pay

## 2022-03-24 ENCOUNTER — Ambulatory Visit (INDEPENDENT_AMBULATORY_CARE_PROVIDER_SITE_OTHER): Payer: 59 | Admitting: Dermatology

## 2022-03-24 DIAGNOSIS — R1115 Cyclical vomiting syndrome unrelated to migraine: Secondary | ICD-10-CM

## 2022-03-24 DIAGNOSIS — R21 Rash and other nonspecific skin eruption: Secondary | ICD-10-CM | POA: Diagnosis not present

## 2022-03-24 DIAGNOSIS — L281 Prurigo nodularis: Secondary | ICD-10-CM | POA: Diagnosis not present

## 2022-03-24 DIAGNOSIS — L209 Atopic dermatitis, unspecified: Secondary | ICD-10-CM | POA: Diagnosis not present

## 2022-03-24 NOTE — Patient Instructions (Addendum)
Wound Care Instructions ° °Cleanse wound gently with soap and water once a day then pat dry with clean gauze. Apply a thing coat of Petrolatum (petroleum jelly, "Vaseline") over the wound (unless you have an allergy to this). We recommend that you use a new, sterile tube of Vaseline. Do not pick or remove scabs. Do not remove the yellow or white "healing tissue" from the base of the wound. ° °Cover the wound with fresh, clean, nonstick gauze and secure with paper tape. You may use Band-Aids in place of gauze and tape if the would is small enough, but would recommend trimming much of the tape off as there is often too much. Sometimes Band-Aids can irritate the skin. ° °You should call the office for your biopsy report after 1 week if you have not already been contacted. ° °If you experience any problems, such as abnormal amounts of bleeding, swelling, significant bruising, significant pain, or evidence of infection, please call the office immediately. ° °FOR ADULT SURGERY PATIENTS: If you need something for pain relief you may take 1 extra strength Tylenol (acetaminophen) AND 2 Ibuprofen (200mg each) together every 4 hours as needed for pain. (do not take these if you are allergic to them or if you have a reason you should not take them.) Typically, you may only need pain medication for 1 to 3 days.  ° ° °If You Need Anything After Your Visit ° °If you have any questions or concerns for your doctor, please call our main line at [phone] and press option 4 to reach your doctor's medical assistant. If no one answers, please leave a voicemail as directed and we will return your call as soon as possible. Messages left after 4 pm will be answered the following business day.  ° °You may also send us a message via MyChart. We typically respond to MyChart messages within 1-2 business days. ° °For prescription refills, please ask your pharmacy to contact our office. Our fax number is [phone]. ° °If you have an urgent  issue when the clinic is closed that cannot wait until the next business day, you can page your doctor at the number below.   ° °Please note that while we do our best to be available for urgent issues outside of office hours, we are not available 24/7.  ° °If you have an urgent issue and are unable to reach us, you may choose to seek medical care at your doctor's office, retail clinic, urgent care center, or emergency room. ° °If you have a medical emergency, please immediately call 911 or go to the emergency department. ° °Pager Numbers ° °- Dr. Kowalski: [phone] ° °- Dr. Moye: [phone] ° °- Dr. Stewart: [phone] ° °In the event of inclement weather, please call our main line at [phone] for an update on the status of any delays or closures. ° °Dermatology Medication Tips: °Please keep the boxes that topical medications come in in order to help keep track of the instructions about where and how to use these. Pharmacies typically print the medication instructions only on the boxes and not directly on the medication tubes.  ° °If your medication is too expensive, please contact our office at [phone] option 4 or send us a message through MyChart.  ° °We are unable to tell what your co-pay for medications will be in advance as this is different depending on your insurance coverage. However, we may be able to find a substitute medication at lower cost or fill   out paperwork to get insurance to cover a needed medication.  ° °If a prior authorization is required to get your medication covered by your insurance company, please allow us 1-2 business days to complete this process. ° °Drug prices often vary depending on where the prescription is filled and some pharmacies may offer cheaper prices. ° °The website www.goodrx.com contains coupons for medications through different pharmacies. The prices here do not account for what the cost may be with help from insurance (it may be cheaper with your  insurance), but the website can give you the price if you did not use any insurance.  °- You can print the associated coupon and take it with your prescription to the pharmacy.  °- You may also stop by our office during regular business hours and pick up a GoodRx coupon card.  °- If you need your prescription sent electronically to a different pharmacy, notify our office through [facility] MyChart or by phone at [phone] option 4. ° ° ° ° °Si Usted Necesita Algo Después de Su Visita ° °También puede enviarnos un mensaje a través de MyChart. Por lo general respondemos a los mensajes de MyChart en el transcurso de 1 a 2 días hábiles. ° °Para renovar recetas, por favor pida a su farmacia que se ponga en contacto con nuestra oficina. Nuestro número de fax es el [phone]. ° °Si tiene un asunto urgente cuando la clínica esté cerrada y que no puede esperar hasta el siguiente día hábil, puede llamar/localizar a su doctor(a) al número que aparece a continuación.  ° °Por favor, tenga en cuenta que aunque hacemos todo lo posible para estar disponibles para asuntos urgentes fuera del horario de oficina, no estamos disponibles las 24 horas del día, los 7 días de la semana.  ° °Si tiene un problema urgente y no puede comunicarse con nosotros, puede optar por buscar atención médica  en el consultorio de su doctor(a), en una clínica privada, en un centro de atención urgente o en una sala de emergencias. ° °Si tiene una emergencia médica, por favor llame inmediatamente al 911 o vaya a la sala de emergencias. ° °Números de bíper ° °- Dr. Kowalski: [phone] ° °- Dra. Moye: [phone] ° °- Dra. Stewart: [phone] ° °En caso de inclemencias del tiempo, por favor llame a nuestra línea principal al [phone] para una actualización sobre el estado de cualquier retraso o cierre. ° °Consejos para la medicación en dermatología: °Por favor, guarde las cajas en las que vienen los medicamentos de uso tópico para ayudarle a  seguir las instrucciones sobre dónde y cómo usarlos. Las farmacias generalmente imprimen las instrucciones del medicamento sólo en las cajas y no directamente en los tubos del medicamento.  ° °Si su medicamento es muy caro, por favor, póngase en contacto con nuestra oficina llamando al [phone] y presione la opción 4 o envíenos un mensaje a través de MyChart.  ° °No podemos decirle cuál será su copago por los medicamentos por adelantado ya que esto es diferente dependiendo de la cobertura de su seguro. Sin embargo, es posible que podamos encontrar un medicamento sustituto a menor costo o llenar un formulario para que el seguro cubra el medicamento que se considera necesario.  ° °Si se requiere una autorización previa para que su compañía de seguros cubra su medicamento, por favor permítanos de 1 a 2 días hábiles para completar este proceso. ° °Los precios de los medicamentos varían con frecuencia dependiendo del lugar de dónde se surte la receta y   alguna farmacias pueden ofrecer precios más baratos. ° °El sitio web www.goodrx.com tiene cupones para medicamentos de diferentes farmacias. Los precios aquí no tienen en cuenta lo que podría costar con la ayuda del seguro (puede ser más barato con su seguro), pero el sitio web puede darle el precio si no utilizó ningún seguro.  °- Puede imprimir el cupón correspondiente y llevarlo con su receta a la farmacia.  °- También puede pasar por nuestra oficina durante el horario de atención regular y recoger una tarjeta de cupones de GoodRx.  °- Si necesita que su receta se envíe electrónicamente a una farmacia diferente, informe a nuestra oficina a través de MyChart de [facility] o por teléfono llamando al [phone] y presione la opción 4. ° ° ° ° °

## 2022-03-24 NOTE — Progress Notes (Signed)
? ?New Patient Visit ? ?Subjective  ?[name] [name] is a 56 y.o. female who presents for the following: Skin Problem (Pt present for skin problem on her body itchy bumps that will come and turn into dark marks and will not go away, past treatment Triamcinolone cream and Mupirocin ointment no help, pt report this rash affects her relationship with her husband, she has not been around her family for the last 2 years because she think she contagious. ). ? ?The following portions of the chart were reviewed this encounter and updated as appropriate:  ? Tobacco  Allergies  Meds  Problems  Med Hx  Surg Hx  Fam Hx   ?  ?Review of Systems:  No other skin or systemic complaints except as noted in HPI or Assessment and Plan. ? ?Objective  ?Well appearing patient in no apparent distress; mood and affect are within normal limits. ? ?A focused examination was performed including face,chest,back, arms, legs . Relevant physical exam findings are noted in the Assessment and Plan. ? ?Right Knee - Anterior, right abdomen ?Hyperpigmented papules and open sores scattered  ? ? ? ? ? ? ? ? ? ? ? ? ? ?Assessment & Plan  ?Rash -clinically appears most consistent with atopic neurodermatitis with prurigo nodularis ?Right Knee - Anterior; right abdomen ?Atopic dermatitis (eczema) is a chronic, relapsing, pruritic condition that can significantly affect quality of life. It is often associated with allergic rhinitis and/or asthma and can require treatment with topical medications, phototherapy, or in severe cases biologic injectable medication (Dupixent; Adbry) or Oral JAK inhibitors.  ? ?May consider Dupixent injection in the future  ? ?Epidermal / dermal shaving - Right Knee - Anterior ? ?Lesion diameter (cm):  0.4 ?Informed consent: discussed and consent obtained   ?Timeout: patient name, date of birth, surgical site, and procedure verified   ?Procedure prep:  Patient was prepped and draped in usual sterile fashion ?Prep type:   Isopropyl alcohol ?Anesthesia: the lesion was anesthetized in a standard fashion   ?Anesthetic:  1% lidocaine w/ epinephrine 1-100,000 buffered w/ 8.4% NaHCO3 ?Hemostasis achieved with: pressure, aluminum chloride and electrodesiccation   ?Outcome: patient tolerated procedure well   ?Post-procedure details: sterile dressing applied and wound care instructions given   ?Dressing type: bandage and petrolatum   ? ?Skin / nail biopsy - right abdomen ?Type of biopsy: punch   ?Informed consent: discussed and consent obtained   ?Timeout: patient name, date of birth, surgical site, and procedure verified   ?Procedure prep:  Patient was prepped and draped in usual sterile fashion (the patient was cleansed and prepped) ?Prep type:  Isopropyl alcohol ?Anesthesia: the lesion was anesthetized in a standard fashion   ?Anesthetic:  1% lidocaine w/ epinephrine 1-100,000 buffered w/ 8.4% NaHCO3 ?Punch size:  4 mm ?Suture size:  4-0 ?Suture type: nylon   ?Hemostasis achieved with: suture, pressure and aluminum chloride   ?Outcome: patient tolerated procedure well   ?Post-procedure details: sterile dressing applied and wound care instructions given   ?Dressing type: bandage, petrolatum and pressure dressing   ? ?Specimen 1 - Surgical pathology ?Differential Diagnosis: R/O atopic dermatitis vs Prurigo nodularis  ?Check Margins: No ? ?Specimen 2 - Surgical pathology ?Differential Diagnosis: R/O atopic dermatitis vs Prurigo nodularis  ?Check Margins: No ? ?Return in about 1 week (around 03/31/2022) for suture removal and discuss biopsy results. ? ?I, Marye Round, CMA, am acting as scribe for Sarina Ser, MD .  ?Documentation: I have reviewed the above documentation for  accuracy and completeness, and I agree with the above. ? ?Sarina Ser, MD ? ?

## 2022-03-25 ENCOUNTER — Encounter: Payer: Self-pay | Admitting: Family Medicine

## 2022-03-25 ENCOUNTER — Encounter: Payer: Self-pay | Admitting: Dermatology

## 2022-03-25 ENCOUNTER — Ambulatory Visit (INDEPENDENT_AMBULATORY_CARE_PROVIDER_SITE_OTHER): Payer: 59 | Admitting: Family Medicine

## 2022-03-25 VITALS — BP 154/59 | HR 54 | Ht 62.0 in | Wt 166.4 lb

## 2022-03-25 DIAGNOSIS — E1159 Type 2 diabetes mellitus with other circulatory complications: Secondary | ICD-10-CM

## 2022-03-25 DIAGNOSIS — L280 Lichen simplex chronicus: Secondary | ICD-10-CM

## 2022-03-25 DIAGNOSIS — N184 Chronic kidney disease, stage 4 (severe): Secondary | ICD-10-CM

## 2022-03-25 DIAGNOSIS — E1122 Type 2 diabetes mellitus with diabetic chronic kidney disease: Secondary | ICD-10-CM | POA: Diagnosis not present

## 2022-03-25 DIAGNOSIS — Z794 Long term (current) use of insulin: Secondary | ICD-10-CM

## 2022-03-25 DIAGNOSIS — I152 Hypertension secondary to endocrine disorders: Secondary | ICD-10-CM

## 2022-03-25 HISTORY — DX: Lichen simplex chronicus: L28.0

## 2022-03-25 MED ORDER — LORAZEPAM 1 MG PO TABS: 1.0000 mg | ORAL_TABLET | Freq: Four times a day (QID) | ORAL | 0 refills | Status: DC | PRN

## 2022-03-25 MED ORDER — CLONIDINE HCL 0.1 MG PO TABS: 0.1000 mg | ORAL_TABLET | Freq: Two times a day (BID) | ORAL | 11 refills | Status: DC

## 2022-03-25 MED ORDER — ONETOUCH VERIO REFLECT W/DEVICE KIT: 1.0000 | PACK | Freq: Three times a day (TID) | 99 refills | Status: DC

## 2022-03-25 NOTE — Patient Instructions (Signed)
Your blood pressure is not yet under good control. ?Please start Clonicine 0.1 mg tablet, one tablet twice a day. ? ?See Dr Sameka Bagent in 3 weeks to see how your blood pressure is doing.   ?

## 2022-03-26 ENCOUNTER — Other Ambulatory Visit (HOSPITAL_COMMUNITY): Payer: Self-pay

## 2022-03-26 ENCOUNTER — Telehealth: Payer: Self-pay

## 2022-03-26 ENCOUNTER — Encounter: Payer: Self-pay | Admitting: Family Medicine

## 2022-03-26 NOTE — Progress Notes (Signed)
[name] [name] is alone ?Sources of clinical information for visit is/are patient. ?Nursing assessment for this office visit was reviewed with the patient for accuracy and revision.  ? ? ? ?Previous Report(s) Reviewed: none  ? ?  03/25/2022  ?  9:43 AM  ?Depression screen PHQ 2/9  ?Decreased Interest 0  ?Down, Depressed, Hopeless 0  ?PHQ - 2 Score 0  ?Change in appetite 3  ?Feeling bad or failure about yourself  0  ?Trouble concentrating 0  ?Moving slowly or fidgety/restless 0  ?Suicidal thoughts 0  ? ?[place] Office Visit from 03/25/2022 in [place] Office Visit from 03/11/2022 in [place] Office Visit from 12/24/2021 in [place]  ?Thoughts that you would be better off dead, or of hurting yourself in some way Not at all Not at all Not at all  ?PHQ-9 Total Score -- 6 8  ? ?  ?  ? ?  09/16/2021  ?  3:01 PM 06/05/2020  ? 10:31 AM 11/16/2018  ?  2:50 PM 07/28/2018  ?  8:51 AM 01/05/2018  ?  8:59 AM  ?Fall Risk   ?Falls in the past year? 0 1 0 No No  ?Number falls in past yr: 0 0 0    ?Injury with Fall? 0 1 0    ? ? ? ?  03/25/2022  ?  9:43 AM 03/11/2022  ? 10:39 AM 12/24/2021  ?  1:49 PM  ?PHQ9 SCORE ONLY  ?PHQ-9 Total Score '3 6 8  '$ ? ? ?Adult vaccines due  ?Topic Date Due  ? TETANUS/TDAP  09/29/2023  ? ? ?Health Maintenance Due  ?Topic Date Due  ? Zoster Vaccines- Shingrix (1 of 2) Never done  ? FOOT EXAM  07/10/2021  ?  ? ? ?History/P.E. limitations: none ? ?Adult vaccines due  ?Topic Date Due  ? TETANUS/TDAP  09/29/2023  ?  ?Diabetes Health Maintenance Due  ?Topic Date Due  ? FOOT EXAM  07/10/2021  ? HEMOGLOBIN A1C  09/11/2022  ? OPHTHALMOLOGY EXAM  02/23/2023  ?  ?Health Maintenance Due  ?Topic Date Due  ? Zoster Vaccines- Shingrix (1 of 2) Never done  ? FOOT EXAM  07/10/2021  ?  ? ?Chief Complaint  ?Patient presents with  ? 2 wk f/u  ?  ? ?

## 2022-03-26 NOTE — Assessment & Plan Note (Addendum)
Established problem ?Uncontrolled depite increase of diltizem 24hr 180 twice a day and continuation carvedilol 6.25 twice a day ? ?Start: Clonidine 0.1 mg tablet, one tablet BID ?Continue: Diltiazem and carvedilol at doses above ? ?RTC 2 weeks to assess tolerance and response ? ?

## 2022-03-26 NOTE — Telephone Encounter (Signed)
A Prior Authorization was initiated for this patients LORAZEPAM through CoverMyMeds.  ? ?Key: [id number] ? ?

## 2022-03-26 NOTE — Assessment & Plan Note (Signed)
Plan to start Linagliptin once effective pharmacological treatments of hypertension found.  ?

## 2022-03-30 ENCOUNTER — Other Ambulatory Visit: Payer: Self-pay

## 2022-03-30 ENCOUNTER — Other Ambulatory Visit (HOSPITAL_COMMUNITY): Payer: Self-pay

## 2022-03-30 MED ORDER — ONETOUCH ULTRA VI STRP: ORAL_STRIP | 12 refills | Status: DC

## 2022-03-30 NOTE — Telephone Encounter (Signed)
Spoke with patient. Informed her of the note below. She stated that she will just pay out of pocket. She also mentioned that she need a refill for her test strips. I will send the request to her PCP. Salvatore Marvel, CMA ? ?

## 2022-03-30 NOTE — Telephone Encounter (Signed)
Reviewed denial. ?Unfortunately, the use of benzodiazepines for cyclic vomiting attacks is off-label.   ?This pharmacological therapy will require [name] [name] to purchase the Lorazepam out-of-pocket.  ? ?Please let [name] [name] know of the insurance denial of coverage for her Lorazepam.  Please let her know if she wishes to continue the medication, she will have to purchase it without insurance assistance.  ?

## 2022-03-30 NOTE — Telephone Encounter (Signed)
Prior Auth for patients medication LORAZEPAM denied by [facility] via CoverMyMeds.  ? ?Reason:  ? ? ?CoverMyMeds Key: [id number] ? ?Denial letter attached to pt chart ? ?

## 2022-03-31 ENCOUNTER — Ambulatory Visit (INDEPENDENT_AMBULATORY_CARE_PROVIDER_SITE_OTHER): Payer: 59 | Admitting: Dermatology

## 2022-03-31 DIAGNOSIS — L281 Prurigo nodularis: Secondary | ICD-10-CM | POA: Diagnosis not present

## 2022-03-31 DIAGNOSIS — L280 Lichen simplex chronicus: Secondary | ICD-10-CM | POA: Diagnosis not present

## 2022-03-31 DIAGNOSIS — Z79899 Other long term (current) drug therapy: Secondary | ICD-10-CM

## 2022-03-31 DIAGNOSIS — L2081 Atopic neurodermatitis: Secondary | ICD-10-CM

## 2022-03-31 DIAGNOSIS — L2089 Other atopic dermatitis: Secondary | ICD-10-CM

## 2022-03-31 MED ORDER — DUPILUMAB 300 MG/2ML ~~LOC~~ SOAJ
600.0000 mg | Freq: Once | SUBCUTANEOUS | Status: AC
  Administered 2022-03-31: 600 mg via SUBCUTANEOUS

## 2022-03-31 MED ORDER — DUPIXENT 300 MG/2ML ~~LOC~~ SOAJ: 300.0000 mg | SUBCUTANEOUS | 5 refills | Status: DC

## 2022-03-31 NOTE — Patient Instructions (Signed)

## 2022-03-31 NOTE — Progress Notes (Signed)
? ?  Follow-Up Visit ?  ?Subjective  ?[name] [name] is a 56 y.o. female who presents for the following: Rash (Patient is here today for suture removal, pathology results, and to discuss treatment options.). ? ?The following portions of the chart were reviewed this encounter and updated as appropriate:  ? Tobacco  Allergies  Meds  Problems  Med Hx  Surg Hx  Fam Hx   ?  ?Review of Systems:  No other skin or systemic complaints except as noted in HPI or Assessment and Plan. ? ?Objective  ?Well appearing patient in no apparent distress; mood and affect are within normal limits. ? ?A focused examination was performed including the trunk and extremities. Relevant physical exam findings are noted in the Assessment and Plan. ? ?Trunk, extremities ?40 prurigo nodules, hypopigmented papules.  ? ? ?Assessment & Plan  ?Prurigo nodularis ?Trunk, extremities ?Bx proven - with atopic neurodermatitis and LSC ? ?Chronic and persistent condition with duration or expected duration over one year. Condition is symptomatic / bothersome to patient. Not to goal. ? ?Discussed pathology results and treatment options. Recommend starting Dupixent '300mg'$ /29m SQ Q2W.  ? ?Dupixent '300mg'$ /2474minjected SQ into the R upper arm SQ and Dupixent '300mg'$ /74m55mnjected SQ into the L upper arm SQ. Patient tolerated injections well. AL, CMA ? ?Dupilumab (Dupixent) is a treatment given by injection for adults and children with moderate-to-severe atopic dermatitis and prurigo nodularis. Goal is control of skin condition, not cure. It is given as 2 injections at the first dose followed by 1 injection ever 2 weeks thereafter.  Young children are dosed monthly. ? ?Potential side effects include allergic reaction, herpes infections, injection site reactions and conjunctivitis (inflammation of the eyes).  The use of Dupixent requires long term medication management, including periodic office visits. ? ?Advised patient that the hyperpigmentation may fade over  the next year.  ? ?Dupilumab (DUPIXENT) 300 MG/2ML SOPN - Trunk, extremities ?Inject 300 mg into the skin every 14 (fourteen) days. Starting at day 15 for maintenance. ? ?Dupilumab SOPN 600 mg - Trunk, extremities ? ?Return in about 2 weeks (around 04/14/2022) for Dupixent injection. ? ?I, AshRudell CobbMA, am acting as scribe for DavSarina SerD . ?Documentation: I have reviewed the above documentation for accuracy and completeness, and I agree with the above. ? ?DavSarina SerD ? ?

## 2022-04-01 ENCOUNTER — Encounter: Payer: Self-pay | Admitting: Dermatology

## 2022-04-05 ENCOUNTER — Encounter: Payer: Self-pay | Admitting: Family Medicine

## 2022-04-07 ENCOUNTER — Ambulatory Visit: Payer: 59 | Admitting: Dermatology

## 2022-04-08 ENCOUNTER — Other Ambulatory Visit: Payer: Self-pay | Admitting: *Deleted

## 2022-04-08 DIAGNOSIS — R1115 Cyclical vomiting syndrome unrelated to migraine: Secondary | ICD-10-CM

## 2022-04-08 MED ORDER — OXYCODONE HCL 5 MG PO TABS: 5.0000 mg | ORAL_TABLET | Freq: Four times a day (QID) | ORAL | 0 refills | Status: DC | PRN

## 2022-04-08 NOTE — Telephone Encounter (Signed)
Patient called stating that she isn't feeling well and hasn't been since Monday with her stomach pain.  Patient said that she is due to refill her pain medication on Sunday and will need a refill sent to the pharmacy.  She does have an appointment with PCP next week.   ? ?If the patient needs: ? ? An appointment - route response to [facility] admin ? A callback from clinic staff - route response to your team ? ? ?Only route to RN Team: form completions or if RN team directly requests response sent to them ? ? ?Diamond Jentz, CMA ? ?

## 2022-04-14 ENCOUNTER — Ambulatory Visit: Payer: 59 | Admitting: Dermatology

## 2022-04-15 ENCOUNTER — Ambulatory Visit (INDEPENDENT_AMBULATORY_CARE_PROVIDER_SITE_OTHER): Payer: 59 | Admitting: Family Medicine

## 2022-04-15 ENCOUNTER — Encounter: Payer: Self-pay | Admitting: Family Medicine

## 2022-04-15 VITALS — BP 160/57 | HR 57 | Ht 62.0 in | Wt 159.8 lb

## 2022-04-15 DIAGNOSIS — N184 Chronic kidney disease, stage 4 (severe): Secondary | ICD-10-CM

## 2022-04-15 DIAGNOSIS — E1122 Type 2 diabetes mellitus with diabetic chronic kidney disease: Secondary | ICD-10-CM | POA: Diagnosis not present

## 2022-04-15 DIAGNOSIS — K3184 Gastroparesis: Secondary | ICD-10-CM

## 2022-04-15 DIAGNOSIS — E113293 Type 2 diabetes mellitus with mild nonproliferative diabetic retinopathy without macular edema, bilateral: Secondary | ICD-10-CM | POA: Diagnosis not present

## 2022-04-15 DIAGNOSIS — E1143 Type 2 diabetes mellitus with diabetic autonomic (poly)neuropathy: Secondary | ICD-10-CM | POA: Diagnosis not present

## 2022-04-15 DIAGNOSIS — Z794 Long term (current) use of insulin: Secondary | ICD-10-CM

## 2022-04-15 DIAGNOSIS — I152 Hypertension secondary to endocrine disorders: Secondary | ICD-10-CM

## 2022-04-15 DIAGNOSIS — E1159 Type 2 diabetes mellitus with other circulatory complications: Secondary | ICD-10-CM

## 2022-04-15 MED ORDER — ONETOUCH VERIO VI STRP: ORAL_STRIP | 12 refills | Status: DC

## 2022-04-15 MED ORDER — LINAGLIPTIN 5 MG PO TABS: 5.0000 mg | ORAL_TABLET | Freq: Every day | ORAL | 3 refills | Status: DC

## 2022-04-15 NOTE — Patient Instructions (Signed)
Stop the Clonidine. ? ?Dr Nanie Dunkleberger will contact your kidney doctors to see what we can do about your high blood pressure.   ? ?Start Linagliptin to lower your blood sugar.  Take the pill once a day.   ?

## 2022-04-16 ENCOUNTER — Encounter: Payer: Self-pay | Admitting: Family Medicine

## 2022-04-16 ENCOUNTER — Telehealth: Payer: Self-pay | Admitting: Family Medicine

## 2022-04-16 NOTE — Assessment & Plan Note (Signed)
Lab Results  ?Component Value Date  ? HGBA1C 8.1 (A) 03/11/2022  ? HGBA1C 7.6 (A) 12/24/2021  ? HGBA1C 6.7 (H) 08/07/2021  ? ?Ms Donlan has not been checking her CBGs because she did not have test strips.  ?Seroquel may be contributing to some insulin resistance ?She continues to take her Lantus 20 units  qam ?She was intolerant of low dose GLP-1 ?Last few eGFR around 20 ml/min.  ?          Can consider low dose dapagliflozin 2.5 mg next office visit  ? ?Start Linagliptin 5 mg po daily ?RTC 1 month. ?Strips Rx'd.  ? ?

## 2022-04-16 NOTE — Progress Notes (Signed)
[name] [name] is alone ?Sources of clinical information for visit is/are patient. ?Nursing assessment for this office visit was reviewed with the patient for accuracy and revision.  ? ? ? ?Previous Report(s) Reviewed: none  ? ?  04/15/2022  ?  2:57 PM  ?Depression screen PHQ 2/9  ?Decreased Interest 1  ?Down, Depressed, Hopeless 0  ?PHQ - 2 Score 1  ?Altered sleeping 3  ?Tired, decreased energy 1  ?Change in appetite 3  ?Feeling bad or failure about yourself  0  ?Trouble concentrating 0  ?Moving slowly or fidgety/restless 1  ?Suicidal thoughts 0  ?PHQ-9 Score 9  ? ?[place] Office Visit from 04/15/2022 in [place] Office Visit from 03/25/2022 in [place] Office Visit from 03/11/2022 in [place]  ?Thoughts that you would be better off dead, or of hurting yourself in some way Not at all Not at all Not at all  ?PHQ-9 Total Score 9 -- 6  ? ?  ?  ? ?  09/16/2021  ?  3:01 PM 06/05/2020  ? 10:31 AM 11/16/2018  ?  2:50 PM 07/28/2018  ?  8:51 AM 01/05/2018  ?  8:59 AM  ?Fall Risk   ?Falls in the past year? 0 1 0 No No  ?Number falls in past yr: 0 0 0    ?Injury with Fall? 0 1 0    ? ? ? ?  04/15/2022  ?  2:57 PM 03/25/2022  ?  9:43 AM 03/11/2022  ? 10:39 AM  ?PHQ9 SCORE ONLY  ?PHQ-9 Total Score '9 3 6  '$ ? ? ?Adult vaccines due  ?Topic Date Due  ? TETANUS/TDAP  09/29/2023  ? ? ?Health Maintenance Due  ?Topic Date Due  ? Zoster Vaccines- Shingrix (1 of 2) Never done  ? FOOT EXAM  07/10/2021  ?  ? ? ?History/P.E. limitations: none ? ?Adult vaccines due  ?Topic Date Due  ? TETANUS/TDAP  09/29/2023  ?  ?Diabetes Health Maintenance Due  ?Topic Date Due  ? FOOT EXAM  07/10/2021  ? HEMOGLOBIN A1C  09/11/2022  ? OPHTHALMOLOGY EXAM  02/23/2023  ?  ?Health Maintenance Due  ?Topic Date Due  ? Zoster Vaccines- Shingrix (1 of 2) Never done  ? FOOT EXAM  07/10/2021  ?  ? ?Chief Complaint  ?Patient presents with  ? Hypertension  ? Diabetes  ?  ? ?

## 2022-04-16 NOTE — Telephone Encounter (Signed)
Spoke with Ms Laye that I was going to ask Dr Joelyn Oms ([facility]) if a trial of hydralazine for her uncontrolled BP is best next choice.  ?

## 2022-04-16 NOTE — Assessment & Plan Note (Addendum)
Established problem ?Uncontrolled.  Patient is not at goal of <130/80 ?Intolerant of Clonidine 0.1 mg tablet (sedating). ?Thinking about starting Hydralazine 10 mg four times a day, but will first communicate with [facility] to see if this is the next best step for her BP mngt.  ? ?Continue: Diltiazem CD 180 mg twice a day, Carvedilol 12.5 mg BID ? ?

## 2022-04-27 ENCOUNTER — Other Ambulatory Visit: Payer: Self-pay

## 2022-04-27 DIAGNOSIS — R1115 Cyclical vomiting syndrome unrelated to migraine: Secondary | ICD-10-CM

## 2022-04-27 MED ORDER — LORAZEPAM 1 MG PO TABS: 1.0000 mg | ORAL_TABLET | Freq: Four times a day (QID) | ORAL | 0 refills | Status: DC | PRN

## 2022-04-29 ENCOUNTER — Ambulatory Visit: Payer: 59 | Admitting: Pharmacist

## 2022-05-06 ENCOUNTER — Other Ambulatory Visit: Payer: Self-pay | Admitting: Family Medicine

## 2022-05-06 ENCOUNTER — Other Ambulatory Visit: Payer: Self-pay

## 2022-05-06 DIAGNOSIS — R1115 Cyclical vomiting syndrome unrelated to migraine: Secondary | ICD-10-CM

## 2022-05-06 MED ORDER — OXYCODONE HCL 5 MG PO TABS: 5.0000 mg | ORAL_TABLET | Freq: Four times a day (QID) | ORAL | 0 refills | Status: AC | PRN

## 2022-05-13 ENCOUNTER — Ambulatory Visit: Payer: 59 | Admitting: Family Medicine

## 2022-05-26 ENCOUNTER — Other Ambulatory Visit: Payer: Self-pay | Admitting: *Deleted

## 2022-05-26 DIAGNOSIS — R1115 Cyclical vomiting syndrome unrelated to migraine: Secondary | ICD-10-CM

## 2022-05-26 MED ORDER — LORAZEPAM 1 MG PO TABS: 1.0000 mg | ORAL_TABLET | Freq: Four times a day (QID) | ORAL | 0 refills | Status: DC | PRN

## 2022-06-02 ENCOUNTER — Other Ambulatory Visit: Payer: Self-pay | Admitting: Family Medicine

## 2022-06-02 DIAGNOSIS — E118 Type 2 diabetes mellitus with unspecified complications: Secondary | ICD-10-CM

## 2022-06-02 DIAGNOSIS — E1143 Type 2 diabetes mellitus with diabetic autonomic (poly)neuropathy: Secondary | ICD-10-CM

## 2022-06-02 DIAGNOSIS — G8929 Other chronic pain: Secondary | ICD-10-CM

## 2022-06-02 DIAGNOSIS — E11311 Type 2 diabetes mellitus with unspecified diabetic retinopathy with macular edema: Secondary | ICD-10-CM

## 2022-06-02 DIAGNOSIS — J301 Allergic rhinitis due to pollen: Secondary | ICD-10-CM

## 2022-06-02 DIAGNOSIS — N184 Chronic kidney disease, stage 4 (severe): Secondary | ICD-10-CM

## 2022-06-02 NOTE — Progress Notes (Signed)
    SUBJECTIVE:   CHIEF COMPLAINT / HPI:   Left hip pain: She was walking down the stairs 7 days ago and when she planted her foot she felt a sharp pain and a catching sensation. She did not fall. She notes that since this occurred she had difficulty bearing weight on that leg and sharp pains when she does as well as a catching sensation as if it wants to lock up. The pain stays in the front aspect of her left hip and does not radiate,   PERTINENT  PMH / PSH: None relevant  OBJECTIVE:   BP (!) 171/80   Pulse (!) 53   Ht '5\' 2"'$  (1.575 m)   Wt 164 lb 12.8 oz (74.8 kg)   SpO2 100%   BMI 30.14 kg/m    General: NAD, pleasant, able to participate in exam Respiratory: No respiratory distress MSK: Left hip with no overlying skin changes, no bruising, no erythema or skin breakages.  She has minimal pain with palpation of the lateral aspect of the hip.  When performing hip flexion and extension she has mild discomfort radiating into the hip.  When performing internal rotation she has significant discomfort radiating into the hip.  Mild discomfort when performing external hip rotation.  She has normal strength with hip flexion. Psych: Normal affect and mood  ASSESSMENT/PLAN:   Left hip pain: Occurred after stepping down from a step and getting a sharp pain.  Since then she has noticed difficulty bearing weight and is felt as though her hip may catch or lock up.  On physical exam she does have significant discomfort when providing internal hip rotation.  Differential is broad and can include injury to the labrum versus other ligaments around the hip versus injury to the femur.  She does have difficulty bearing weight in my presence today.  We will start with an x-ray.  If this is negative we will consider a CT of the hip versus MRI.     [name] [name], [place]

## 2022-06-03 ENCOUNTER — Ambulatory Visit (INDEPENDENT_AMBULATORY_CARE_PROVIDER_SITE_OTHER): Payer: 59 | Admitting: Family Medicine

## 2022-06-03 VITALS — BP 171/80 | HR 53 | Ht 62.0 in | Wt 164.8 lb

## 2022-06-03 DIAGNOSIS — M25552 Pain in left hip: Secondary | ICD-10-CM

## 2022-06-03 NOTE — Patient Instructions (Signed)
For your hip pain I would like for you to go across the street to the front entrance of [facility] to get an x-ray.  Once I get the results of the x-ray we will decide next steps.  I will reach out to you likely tomorrow once the x-ray is completed and read.  If develop any fevers, worsening pain, or other concerns please let us know immediately.

## 2022-06-04 ENCOUNTER — Ambulatory Visit (HOSPITAL_COMMUNITY)
Admission: RE | Admit: 2022-06-04 | Discharge: 2022-06-04 | Disposition: A | Discharge status: Home / Self Care | Payer: 59 | Source: Ambulatory Visit | Attending: Family Medicine | Admitting: Family Medicine

## 2022-06-04 DIAGNOSIS — M25552 Pain in left hip: Secondary | ICD-10-CM | POA: Diagnosis present

## 2022-06-07 ENCOUNTER — Telehealth: Payer: Self-pay

## 2022-06-07 DIAGNOSIS — R1115 Cyclical vomiting syndrome unrelated to migraine: Secondary | ICD-10-CM

## 2022-06-07 MED ORDER — OXYCODONE HCL 5 MG PO TABS: 5.0000 mg | ORAL_TABLET | Freq: Four times a day (QID) | ORAL | 0 refills | Status: DC | PRN
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
## 2022-06-08 ENCOUNTER — Other Ambulatory Visit: Payer: Self-pay | Admitting: Family Medicine

## 2022-06-08 DIAGNOSIS — M25552 Pain in left hip: Secondary | ICD-10-CM

## 2022-06-15 ENCOUNTER — Ambulatory Visit (HOSPITAL_COMMUNITY): Payer: 59

## 2022-06-17 ENCOUNTER — Ambulatory Visit (HOSPITAL_COMMUNITY): Payer: 59 | Attending: Family Medicine

## 2022-06-17 ENCOUNTER — Ambulatory Visit: Payer: 59 | Admitting: Family Medicine

## 2022-06-23 ENCOUNTER — Other Ambulatory Visit: Payer: Self-pay

## 2022-06-23 DIAGNOSIS — R1115 Cyclical vomiting syndrome unrelated to migraine: Secondary | ICD-10-CM

## 2022-06-23 MED ORDER — LORAZEPAM 1 MG PO TABS: 1.0000 mg | ORAL_TABLET | Freq: Four times a day (QID) | ORAL | 0 refills | Status: DC | PRN

## 2022-07-05 ENCOUNTER — Other Ambulatory Visit: Payer: Self-pay

## 2022-07-05 DIAGNOSIS — R1115 Cyclical vomiting syndrome unrelated to migraine: Secondary | ICD-10-CM

## 2022-07-05 MED ORDER — OXYCODONE HCL 5 MG PO TABS: 5.0000 mg | ORAL_TABLET | Freq: Four times a day (QID) | ORAL | 0 refills | Status: DC | PRN

## 2022-07-07 ENCOUNTER — Telehealth: Payer: Self-pay

## 2022-07-07 DIAGNOSIS — R1115 Cyclical vomiting syndrome unrelated to migraine: Secondary | ICD-10-CM

## 2022-07-07 NOTE — Telephone Encounter (Signed)
Patient calls nurse line requesting pain medication prescription be sent to a different [facility].   [facility] on [address] has no stock. I have called and cancelled this prescription there.   Patient reports stock at [facility] on [address]   Please resend to [facility] on [address].

## 2022-07-08 NOTE — Telephone Encounter (Signed)
[facility] on [address] still does not have medication in stock. Please send to [facility] on [address] instead.

## 2022-07-09 MED ORDER — OXYCODONE HCL 5 MG PO TABS: 5.0000 mg | ORAL_TABLET | Freq: Four times a day (QID) | ORAL | 0 refills | Status: AC | PRN

## 2022-07-22 ENCOUNTER — Encounter: Payer: Self-pay | Admitting: Family Medicine

## 2022-07-22 ENCOUNTER — Ambulatory Visit (INDEPENDENT_AMBULATORY_CARE_PROVIDER_SITE_OTHER): Payer: 59 | Admitting: Family Medicine

## 2022-07-22 VITALS — BP 140/66 | HR 56 | Ht 62.0 in | Wt 161.0 lb

## 2022-07-22 DIAGNOSIS — K3184 Gastroparesis: Secondary | ICD-10-CM

## 2022-07-22 DIAGNOSIS — Z794 Long term (current) use of insulin: Secondary | ICD-10-CM

## 2022-07-22 DIAGNOSIS — N184 Chronic kidney disease, stage 4 (severe): Secondary | ICD-10-CM

## 2022-07-22 DIAGNOSIS — I152 Hypertension secondary to endocrine disorders: Secondary | ICD-10-CM

## 2022-07-22 DIAGNOSIS — E1142 Type 2 diabetes mellitus with diabetic polyneuropathy: Secondary | ICD-10-CM

## 2022-07-22 DIAGNOSIS — L989 Disorder of the skin and subcutaneous tissue, unspecified: Secondary | ICD-10-CM

## 2022-07-22 DIAGNOSIS — R1115 Cyclical vomiting syndrome unrelated to migraine: Secondary | ICD-10-CM

## 2022-07-22 DIAGNOSIS — E1159 Type 2 diabetes mellitus with other circulatory complications: Secondary | ICD-10-CM | POA: Diagnosis not present

## 2022-07-22 DIAGNOSIS — E118 Type 2 diabetes mellitus with unspecified complications: Secondary | ICD-10-CM

## 2022-07-22 DIAGNOSIS — E1122 Type 2 diabetes mellitus with diabetic chronic kidney disease: Secondary | ICD-10-CM | POA: Diagnosis not present

## 2022-07-22 DIAGNOSIS — F39 Unspecified mood [affective] disorder: Secondary | ICD-10-CM

## 2022-07-22 DIAGNOSIS — E1143 Type 2 diabetes mellitus with diabetic autonomic (poly)neuropathy: Secondary | ICD-10-CM

## 2022-07-22 LAB — POCT GLYCOSYLATED HEMOGLOBIN (HGB A1C): HbA1c, POC (controlled diabetic range): 8.6 % — AB (ref 0.0–7.0)

## 2022-07-22 MED ORDER — LINAGLIPTIN 5 MG PO TABS: 5.0000 mg | ORAL_TABLET | Freq: Every day | ORAL | 3 refills | Status: DC

## 2022-07-22 MED ORDER — LORAZEPAM 1 MG PO TABS: 1.0000 mg | ORAL_TABLET | Freq: Four times a day (QID) | ORAL | 0 refills | Status: AC | PRN

## 2022-07-22 NOTE — Patient Instructions (Addendum)
Take the Carvedilol one pill twice a day.  If you take them together, they will last only 12 hours, not 24 hours like you need.   Take the Hydralazine one tablet every 8 hours because if you take them all together, then the medicine will stay in your body for only 8 hours.   Start the Diabetes medicine Tradjenta, 5 mg tablet daily to lower your blood sugars.  It treats your diabetes.   Increase your Lantus 23 units every day.

## 2022-07-23 ENCOUNTER — Encounter: Payer: Self-pay | Admitting: Family Medicine

## 2022-07-23 NOTE — Assessment & Plan Note (Signed)
Established problem Improved control. Dr Joelyn Oms [facility]) started patient on hydralazine 10 mg TID No signs of complications, medication side effects, or red flags. Continue current medications and other regiments.

## 2022-07-23 NOTE — Assessment & Plan Note (Signed)
Established problem. Stable.  No signs of complications, medication side effects, or red flags. Continue current medications and other regiments.

## 2022-07-23 NOTE — Assessment & Plan Note (Signed)
Established problem worsened.  Patient is not at goal of itching/stinging sensation resolution Patient brought in a cellophane tape with ~2 mm  Flat brown irregular substance she thought was an insect/spider that was biting her and causing her skin sensation.  On examination using a 10x lens, it appeared to be scaborous matter c/w what would be expected from fibrinous exudate following from the focal sites of excoriation on her neck and arms.  Ms Gritz is unwilling to restart her Seroquel. I gave my opinion that I did not observe evidence of skin infestation. I encouraged her to resume the [facility] which has helped lessen the intensity of her symptoms in the past.  She will continue to continue to consult with Dr Adolph Pollack (Derm) for treatmment of her atopic dermatitis

## 2022-07-23 NOTE — Assessment & Plan Note (Signed)
Established problem worsened.   Lab Results  Component Value Date   HGBA1C 8.6 (A) 07/22/2022   Patient is not at goal of A1c < 8.0 Start: Patient had not started linagliptin as Prescriped last office visit.  Recommendshe now start it.  Change: Increase Lantus to 23 units daily RTC 3 month

## 2022-07-23 NOTE — Assessment & Plan Note (Addendum)
Established problem Adequate abortive therapy Four episodes in last month that patient was able to control on own using abortive med regiment Refilled her lorazepam rescue med Continue mirtazapine Cautioned about  Possible role of marijuana use as causal contributor to these episodes

## 2022-07-23 NOTE — Progress Notes (Signed)
[name] [name] is alone Sources of clinical information for visit is/are patient. Nursing assessment for this office visit was reviewed with the patient for accuracy and revision.     Previous Report(s) Reviewed: lab reports Basic Metabolic Panel and Z6X    04/15/2022    2:57 PM  Depression screen PHQ 2/9  Decreased Interest 1  Down, Depressed, Hopeless 0  PHQ - 2 Score 1  Altered sleeping 3  Tired, decreased energy 1  Change in appetite 3  Feeling bad or failure about yourself  0  Trouble concentrating 0  Moving slowly or fidgety/restless 1  Suicidal thoughts 0  PHQ-9 Score 9   [place] Office Visit from 04/15/2022 in [place] Office Visit from 03/25/2022 in [place] Office Visit from 03/11/2022 in [place]  Thoughts that you would be better off dead, or of hurting yourself in some way Not at all Not at all Not at all  PHQ-9 Total Score 9 -- 6          09/16/2021    3:01 PM 06/05/2020   10:31 AM 11/16/2018    2:50 PM 07/28/2018    8:51 AM 01/05/2018    8:59 AM  [place] in the past year? 0 1 0 No No  Number falls in past yr: 0 0 0    Injury with Fall? 0 1 0         04/15/2022    2:57 PM 03/25/2022    9:43 AM 03/11/2022   10:39 AM  PHQ9 SCORE ONLY  PHQ-9 Total Score '9 3 6    '$ Adult vaccines due  Topic Date Due   TETANUS/TDAP  09/29/2023    Health Maintenance Due  Topic Date Due   Zoster Vaccines- Shingrix (1 of 2) Never done   FOOT EXAM  07/10/2021      History/P.E. limitations: none  Adult vaccines due  Topic Date Due   TETANUS/TDAP  09/29/2023    Diabetes Health Maintenance Due  Topic Date Due   FOOT EXAM  07/10/2021   HEMOGLOBIN A1C  01/22/2023   OPHTHALMOLOGY EXAM  02/23/2023    Health Maintenance Due  Topic Date Due   Zoster Vaccines- Shingrix (1 of 2) Never done   FOOT EXAM  07/10/2021     Chief Complaint  Patient presents with   Diabetes    Hypertension

## 2022-07-23 NOTE — Assessment & Plan Note (Addendum)
Established condition with Adequate symptom control. Stress from death of a close friend's son.  Tolerating Mirtazapine medication. Not taking Seroquel.  No clear reason given. Continue Mirtazapine

## 2022-08-10 ENCOUNTER — Telehealth: Payer: Self-pay

## 2022-08-10 DIAGNOSIS — B3731 Acute candidiasis of vulva and vagina: Secondary | ICD-10-CM

## 2022-08-10 DIAGNOSIS — R1115 Cyclical vomiting syndrome unrelated to migraine: Secondary | ICD-10-CM

## 2022-08-10 DIAGNOSIS — E1143 Type 2 diabetes mellitus with diabetic autonomic (poly)neuropathy: Secondary | ICD-10-CM

## 2022-08-10 NOTE — Telephone Encounter (Signed)
Patient calls nurse line for (2) reasons.   (1) Refill on pain medication. I do not see this on current medication list.   (2) Patient is requesting something for vaginal yeast. Patient reports using a Norform Suppository due to not "feeling clean and fresh." However, post use she has developed itchy thick cottage cheese "like" discharge. Patient is requesting treatment.   Will forward to PCP.

## 2022-08-11 MED ORDER — FLUCONAZOLE 150 MG PO TABS: 150.0000 mg | ORAL_TABLET | Freq: Once | ORAL | 0 refills | Status: AC

## 2022-08-11 MED ORDER — OXYCODONE HCL 5 MG PO TABS: 5.0000 mg | ORAL_TABLET | ORAL | 0 refills | Status: DC | PRN

## 2022-08-11 NOTE — Telephone Encounter (Signed)
Prescription oxycodone 5 mg tab #35 for 30 days duration sent Prescription Diflucan 150 mg cap one cap x 1 for possible vulvovaginal candidiasis

## 2022-08-25 ENCOUNTER — Other Ambulatory Visit: Payer: Self-pay

## 2022-08-25 DIAGNOSIS — K3184 Gastroparesis: Secondary | ICD-10-CM

## 2022-08-25 DIAGNOSIS — R1115 Cyclical vomiting syndrome unrelated to migraine: Secondary | ICD-10-CM

## 2022-08-25 NOTE — Telephone Encounter (Signed)
Patient calls nurse line requesting refills on lorazepam and promethazine. Lorazepam is not on current medication list.   Patient is requesting refills to be sent to [facility] on [address].   Will forward to PCP.   Talbot Grumbling, RN

## 2022-08-26 MED ORDER — PROMETHAZINE HCL 25 MG PO TABS: 25.0000 mg | ORAL_TABLET | Freq: Four times a day (QID) | ORAL | 99 refills | Status: DC | PRN

## 2022-08-26 MED ORDER — LORAZEPAM 1 MG PO TABS: 1.0000 mg | ORAL_TABLET | Freq: Two times a day (BID) | ORAL | 0 refills | Status: DC | PRN

## 2022-08-31 ENCOUNTER — Other Ambulatory Visit: Payer: Self-pay

## 2022-08-31 ENCOUNTER — Emergency Department (HOSPITAL_BASED_OUTPATIENT_CLINIC_OR_DEPARTMENT_OTHER): Payer: 59

## 2022-08-31 ENCOUNTER — Encounter (HOSPITAL_COMMUNITY): Payer: Self-pay | Admitting: Emergency Medicine

## 2022-08-31 ENCOUNTER — Inpatient Hospital Stay (HOSPITAL_BASED_OUTPATIENT_CLINIC_OR_DEPARTMENT_OTHER)
Admission: EM | Admit: 2022-08-31 | Discharge: 2022-09-04 | DRG: 439 | Disposition: A | Discharge status: Home / Self Care | Payer: 59 | Attending: Family Medicine | Admitting: Family Medicine

## 2022-08-31 ENCOUNTER — Encounter (HOSPITAL_BASED_OUTPATIENT_CLINIC_OR_DEPARTMENT_OTHER): Payer: Self-pay | Admitting: Obstetrics and Gynecology

## 2022-08-31 ENCOUNTER — Other Ambulatory Visit: Payer: Self-pay | Admitting: Family Medicine

## 2022-08-31 ENCOUNTER — Encounter (HOSPITAL_COMMUNITY): Payer: Self-pay

## 2022-08-31 ENCOUNTER — Ambulatory Visit (HOSPITAL_COMMUNITY)
Admission: EM | Admit: 2022-08-31 | Discharge: 2022-08-31 | Disposition: A | Discharge status: Home / Self Care | Payer: 59

## 2022-08-31 DIAGNOSIS — S29012A Strain of muscle and tendon of back wall of thorax, initial encounter: Secondary | ICD-10-CM | POA: Diagnosis present

## 2022-08-31 DIAGNOSIS — E1169 Type 2 diabetes mellitus with other specified complication: Secondary | ICD-10-CM | POA: Diagnosis present

## 2022-08-31 DIAGNOSIS — I10 Essential (primary) hypertension: Secondary | ICD-10-CM | POA: Diagnosis present

## 2022-08-31 DIAGNOSIS — E1143 Type 2 diabetes mellitus with diabetic autonomic (poly)neuropathy: Secondary | ICD-10-CM | POA: Diagnosis present

## 2022-08-31 DIAGNOSIS — L989 Disorder of the skin and subcutaneous tissue, unspecified: Secondary | ICD-10-CM

## 2022-08-31 DIAGNOSIS — E1165 Type 2 diabetes mellitus with hyperglycemia: Secondary | ICD-10-CM | POA: Diagnosis present

## 2022-08-31 DIAGNOSIS — L281 Prurigo nodularis: Secondary | ICD-10-CM | POA: Diagnosis present

## 2022-08-31 DIAGNOSIS — M7989 Other specified soft tissue disorders: Secondary | ICD-10-CM | POA: Diagnosis present

## 2022-08-31 DIAGNOSIS — K859 Acute pancreatitis without necrosis or infection, unspecified: Principal | ICD-10-CM | POA: Diagnosis present

## 2022-08-31 DIAGNOSIS — L2081 Atopic neurodermatitis: Secondary | ICD-10-CM | POA: Diagnosis present

## 2022-08-31 DIAGNOSIS — M25512 Pain in left shoulder: Secondary | ICD-10-CM | POA: Diagnosis present

## 2022-08-31 DIAGNOSIS — E1122 Type 2 diabetes mellitus with diabetic chronic kidney disease: Secondary | ICD-10-CM | POA: Diagnosis present

## 2022-08-31 DIAGNOSIS — Z66 Do not resuscitate: Secondary | ICD-10-CM | POA: Diagnosis present

## 2022-08-31 DIAGNOSIS — R1115 Cyclical vomiting syndrome unrelated to migraine: Secondary | ICD-10-CM | POA: Diagnosis present

## 2022-08-31 DIAGNOSIS — E785 Hyperlipidemia, unspecified: Secondary | ICD-10-CM | POA: Diagnosis present

## 2022-08-31 DIAGNOSIS — I152 Hypertension secondary to endocrine disorders: Secondary | ICD-10-CM | POA: Diagnosis present

## 2022-08-31 DIAGNOSIS — M545 Low back pain, unspecified: Secondary | ICD-10-CM | POA: Diagnosis present

## 2022-08-31 DIAGNOSIS — G894 Chronic pain syndrome: Secondary | ICD-10-CM | POA: Diagnosis present

## 2022-08-31 DIAGNOSIS — M542 Cervicalgia: Secondary | ICD-10-CM | POA: Diagnosis present

## 2022-08-31 DIAGNOSIS — E1142 Type 2 diabetes mellitus with diabetic polyneuropathy: Secondary | ICD-10-CM

## 2022-08-31 DIAGNOSIS — I160 Hypertensive urgency: Secondary | ICD-10-CM

## 2022-08-31 DIAGNOSIS — E1159 Type 2 diabetes mellitus with other circulatory complications: Secondary | ICD-10-CM | POA: Diagnosis present

## 2022-08-31 DIAGNOSIS — E11649 Type 2 diabetes mellitus with hypoglycemia without coma: Secondary | ICD-10-CM | POA: Diagnosis not present

## 2022-08-31 DIAGNOSIS — G8929 Other chronic pain: Secondary | ICD-10-CM | POA: Diagnosis present

## 2022-08-31 DIAGNOSIS — E118 Type 2 diabetes mellitus with unspecified complications: Secondary | ICD-10-CM

## 2022-08-31 DIAGNOSIS — N184 Chronic kidney disease, stage 4 (severe): Secondary | ICD-10-CM | POA: Diagnosis present

## 2022-08-31 DIAGNOSIS — R001 Bradycardia, unspecified: Secondary | ICD-10-CM | POA: Diagnosis present

## 2022-08-31 DIAGNOSIS — X58XXXA Exposure to other specified factors, initial encounter: Secondary | ICD-10-CM | POA: Diagnosis present

## 2022-08-31 DIAGNOSIS — M62838 Other muscle spasm: Secondary | ICD-10-CM | POA: Diagnosis present

## 2022-08-31 DIAGNOSIS — Z79899 Other long term (current) drug therapy: Secondary | ICD-10-CM

## 2022-08-31 DIAGNOSIS — F32A Depression, unspecified: Secondary | ICD-10-CM | POA: Diagnosis present

## 2022-08-31 DIAGNOSIS — F39 Unspecified mood [affective] disorder: Secondary | ICD-10-CM | POA: Diagnosis present

## 2022-08-31 DIAGNOSIS — K861 Other chronic pancreatitis: Secondary | ICD-10-CM | POA: Diagnosis present

## 2022-08-31 LAB — CBC
HCT: 32.2 % — ABNORMAL LOW (ref 36.0–46.0)
Hemoglobin: 10.9 g/dL — ABNORMAL LOW (ref 12.0–15.0)
MCH: 31 pg (ref 26.0–34.0)
MCHC: 33.9 g/dL (ref 30.0–36.0)
MCV: 91.5 fL (ref 80.0–100.0)
Platelets: 236 10*3/uL (ref 150–400)
RBC: 3.52 MIL/uL — ABNORMAL LOW (ref 3.87–5.11)
RDW: 14.6 % (ref 11.5–15.5)
WBC: 6.1 10*3/uL (ref 4.0–10.5)
nRBC: 0 % (ref 0.0–0.2)

## 2022-08-31 LAB — URINALYSIS, ROUTINE W REFLEX MICROSCOPIC
Bilirubin Urine: NEGATIVE
Glucose, UA: NEGATIVE mg/dL
Ketones, ur: NEGATIVE mg/dL
Leukocytes,Ua: NEGATIVE
Nitrite: NEGATIVE
Protein, ur: 30 mg/dL — AB
Specific Gravity, Urine: 1.009 (ref 1.005–1.030)
pH: 5.5 (ref 5.0–8.0)

## 2022-08-31 LAB — HEPATIC FUNCTION PANEL
ALT: 5 U/L (ref 0–44)
AST: 14 U/L — ABNORMAL LOW (ref 15–41)
Albumin: 4.4 g/dL (ref 3.5–5.0)
Alkaline Phosphatase: 73 U/L (ref 38–126)
Bilirubin, Direct: 0.1 mg/dL (ref 0.0–0.2)
Indirect Bilirubin: 0.3 mg/dL (ref 0.3–0.9)
Total Bilirubin: 0.4 mg/dL (ref 0.3–1.2)
Total Protein: 7.3 g/dL (ref 6.5–8.1)

## 2022-08-31 LAB — BASIC METABOLIC PANEL
Anion gap: 8 (ref 5–15)
BUN: 42 mg/dL — ABNORMAL HIGH (ref 6–20)
CO2: 21 mmol/L — ABNORMAL LOW (ref 22–32)
Calcium: 9.5 mg/dL (ref 8.9–10.3)
Chloride: 111 mmol/L (ref 98–111)
Creatinine, Ser: 2.83 mg/dL — ABNORMAL HIGH (ref 0.44–1.00)
GFR, Estimated: 19 mL/min — ABNORMAL LOW (ref >60)
Glucose, Bld: 55 mg/dL — ABNORMAL LOW (ref 70–99)
Potassium: 4.5 mmol/L (ref 3.5–5.1)
Sodium: 140 mmol/L (ref 135–145)

## 2022-08-31 LAB — CBG MONITORING, ED
Glucose-Capillary: 48 mg/dL — ABNORMAL LOW (ref 70–99)
Glucose-Capillary: 83 mg/dL (ref 70–99)
Glucose-Capillary: 85 mg/dL (ref 70–99)

## 2022-08-31 LAB — TROPONIN I (HIGH SENSITIVITY)
Troponin I (High Sensitivity): 9 ng/L (ref <18)
Troponin I (High Sensitivity): 10 ng/L (ref <18)

## 2022-08-31 LAB — LIPASE, BLOOD: Lipase: 175 U/L — ABNORMAL HIGH (ref 11–51)

## 2022-08-31 MED ORDER — CARVEDILOL 12.5 MG PO TABS
12.5000 mg | ORAL_TABLET | Freq: Once | ORAL | Status: AC
Start: 2022-08-31 — End: 2022-08-31
  Administered 2022-08-31: 12.5 mg via ORAL
  Filled 2022-08-31: qty 1

## 2022-08-31 MED ORDER — HYDRALAZINE HCL 20 MG/ML IJ SOLN
10.0000 mg | Freq: Once | INTRAMUSCULAR | Status: AC
  Administered 2022-08-31: 10 mg via INTRAVENOUS
  Filled 2022-08-31: qty 1

## 2022-08-31 MED ORDER — DILTIAZEM HCL ER COATED BEADS 120 MG PO CP24
120.0000 mg | ORAL_CAPSULE | Freq: Once | ORAL | Status: AC
  Administered 2022-08-31: 120 mg via ORAL
  Filled 2022-08-31: qty 1

## 2022-08-31 MED ORDER — DILTIAZEM HCL ER COATED BEADS 180 MG PO CP24: 180.0000 mg | ORAL_CAPSULE | Freq: Once | ORAL | Status: DC

## 2022-08-31 MED ORDER — HYDRALAZINE HCL 10 MG PO TABS
10.0000 mg | ORAL_TABLET | Freq: Three times a day (TID) | ORAL | Status: DC
  Administered 2022-08-31 – 2022-09-04 (×12): 10 mg via ORAL
  Filled 2022-08-31 (×12): qty 1

## 2022-08-31 MED ORDER — ENOXAPARIN SODIUM 30 MG/0.3ML IJ SOSY
30.0000 mg | PREFILLED_SYRINGE | INTRAMUSCULAR | Status: DC
  Administered 2022-09-01 – 2022-09-03 (×4): 30 mg via SUBCUTANEOUS
  Filled 2022-08-31 (×4): qty 0.3

## 2022-08-31 MED ORDER — ONDANSETRON HCL 4 MG/2ML IJ SOLN
4.0000 mg | Freq: Once | INTRAMUSCULAR | Status: AC
  Administered 2022-08-31: 4 mg via INTRAVENOUS
  Filled 2022-08-31: qty 2

## 2022-08-31 MED ORDER — DROPERIDOL 2.5 MG/ML IJ SOLN
2.5000 mg | Freq: Once | INTRAMUSCULAR | Status: AC
  Administered 2022-08-31: 2.5 mg via INTRAVENOUS
  Filled 2022-08-31: qty 2

## 2022-08-31 MED ORDER — FENTANYL CITRATE PF 50 MCG/ML IJ SOSY
50.0000 ug | PREFILLED_SYRINGE | Freq: Once | INTRAMUSCULAR | Status: AC
  Administered 2022-08-31: 50 ug via INTRAVENOUS
  Filled 2022-08-31: qty 1

## 2022-08-31 NOTE — Progress Notes (Signed)
Received page from ED provider Dr. Kellie Sim[facility] patient.  States that she came in for severe epigastric abdominal pain that radiates to her back.  Also with elevated lipase of 175.  CT abdomen pelvis without contrast unremarkable though clinically appears to have acute on chronic pancreatitis.  Also presents with severe hypertension likely pain contributing.    Will accept patient for transfer.  They will arrange transportation. When patient arrives please page family medicine teaching service at [phone] so that we can come evaluate patient

## 2022-08-31 NOTE — ED Notes (Signed)
Patient is being discharged from the Urgent Care and sent to the Emergency Department via POV . Per [facility] PA, patient is in need of higher level of care due to HTN. Patient is aware and verbalizes understanding of plan of care.  Vitals:   08/31/22 1605  BP: (!) 218/96  Pulse: (!) 58  Resp: 16  Temp: 99 F (37.2 C)  SpO2: 97%

## 2022-08-31 NOTE — ED Notes (Signed)
Patient transported to Radiology 

## 2022-08-31 NOTE — ED Provider Notes (Signed)
[facility] EMERGENCY DEPT Provider Note   CSN: [id number] Arrival date & time: 08/31/22  1715     History {Add pertinent medical, surgical, social history, OB history to HPI:1} Chief Complaint  Patient presents with   Hypertension    [name] [name] is a 56 y.o. female.   Hypertension       Home Medications Prior to Admission medications   Medication Sig Start Date End Date Taking? Authorizing Provider  blood glucose meter kit and supplies KIT Dispense based on patient and insurance preference. Use up to four times daily as directed. (FOR ICD-9 250.00, 250.01). 08/28/20   McDiarmid, Blane Ohara, MD  Blood Glucose Monitoring Suppl (ONETOUCH VERIO REFLECT) w/Device KIT 1 Device by Does not apply route 4 (four) times daily -  before meals and at bedtime. 03/25/22   McDiarmid, Blane Ohara, MD  carvedilol (COREG) 12.5 MG tablet Take 1 tablet (12.5 mg total) by mouth 2 (two) times daily with a meal. 01/26/22   McDiarmid, Blane Ohara, MD  cyclobenzaprine (FLEXERIL) 10 MG tablet 10 mg by oral route. 07/19/19   [provider]  cyclobenzaprine (FLEXERIL) 5 MG tablet 5 mg by oral route. 06/07/19   [provider]  diltiazem (CARDIZEM CD) 180 MG 24 hr capsule Take 1 capsule (180 mg total) by mouth in the morning and at bedtime. 03/12/22   McDiarmid, Blane Ohara, MD  Dupilumab (DUPIXENT) 300 MG/2ML SOPN Inject 300 mg into the skin every 14 (fourteen) days. Starting at day 15 for maintenance. 03/31/22   Ralene Bathe, MD  fluticasone (FLONASE) 50 MCG/ACT nasal spray SHAKE LIQUID AND USE 2 SPRAYS IN [facility] NOSTRIL DAILY 06/03/22   McDiarmid, Blane Ohara, MD  glucose blood (ONETOUCH VERIO) test strip Check blood sugar three times a day 04/15/22   McDiarmid, Blane Ohara, MD  hydrALAZINE (APRESOLINE) 10 MG tablet Take 10 mg by mouth 3 (three) times daily. 06/09/22   [provider]  Insulin Pen Needle (B-D UF III MINI PEN NEEDLES) 31G X 5 MM MISC USE UNDER THE SKIN TWICE DAILY AS DIRECTED 06/03/22    McDiarmid, Blane Ohara, MD  Insulin Pen Needle (NOVOFINE PLUS PEN NEEDLE) 32G X 4 MM MISC [place] injection twice per day. 07/16/21   McDiarmid, Blane Ohara, MD  Lancets (ONETOUCH ULTRASOFT) lancets USE TO CHECK BLOOD SUGAR UP TO FOUR TIMES DAILY 06/03/22   McDiarmid, Blane Ohara, MD  LANTUS SOLOSTAR 100 UNIT/ML Solostar Pen ADMINISTER 20 UNITS UNDER THE SKIN DAILY Patient taking differently: 21 Units daily. 03/17/22   McDiarmid, Blane Ohara, MD  linagliptin (TRADJENTA) 5 MG TABS tablet Take 1 tablet (5 mg total) by mouth daily. 07/22/22   McDiarmid, Blane Ohara, MD  LORazepam (ATIVAN) 1 MG tablet Take 1 tablet (1 mg total) by mouth 2 (two) times daily as needed for anxiety. 08/26/22   McDiarmid, Blane Ohara, MD  lubiprostone (AMITIZA) 24 MCG capsule     [provider]  mirtazapine (REMERON) 7.5 MG tablet TAKE 1 TABLET(7.5 MG) BY MOUTH AT BEDTIME 03/16/22   McDiarmid, Blane Ohara, MD  mupirocin cream (BACTROBAN) 2 % Apply 1 application. topically 2 (two) times daily. 03/12/22   McDiarmid, Blane Ohara, MD  ondansetron (ZOFRAN) 4 MG tablet 4 mg by oral route. 07/19/19   [provider]  oxyCODONE (OXY IR/ROXICODONE) 5 MG immediate release tablet Take 1 tablet (5 mg total) by mouth every 4 (four) hours as needed for severe pain. 08/11/22 09/10/22  McDiarmid, Blane Ohara, MD  phenazopyridine (PYRIDIUM) 100  MG tablet 100 mg by oral route. 07/16/19   [provider]  prednisoLONE acetate (PRED FORTE) 1 % ophthalmic suspension Place 1 drop into the left eye 4 (four) times daily. 06/03/22   [provider]  promethazine (PHENERGAN) 25 MG suppository Place 1 suppository (25 mg total) rectally every 6 (six) hours as needed for nausea or vomiting. 12/07/21   McDiarmid, Blane Ohara, MD  promethazine (PHENERGAN) 25 MG tablet Take 1 tablet (25 mg total) by mouth every 6 (six) hours as needed for nausea or vomiting. 08/26/22   McDiarmid, Blane Ohara, MD  Psyllium (METAMUCIL PO) 1 {packet} by oral route.    [provider]  QUEtiapine  (SEROQUEL) 50 MG tablet TAKE 1 TABLET(50 MG) BY MOUTH AT BEDTIME Patient not taking: Reported on 07/22/2022 01/14/22   McDiarmid, Blane Ohara, MD  tiZANidine (ZANAFLEX) 2 MG tablet Take 1 tablet (2 mg total) by mouth every 8 (eight) hours as needed for muscle spasms. 06/03/22   McDiarmid, Blane Ohara, MD      Allergies    Bee venom, Tegaderm ag mesh [silver], Acetaminophen, Novolog [insulin aspart], Semaglutide, Statins, Amlodipine, Clonidine derivatives, Flexeril [cyclobenzaprine], Lyrica [pregabalin], Nsaids, Ozempic (0.25 or 0.5 mg-dose) [semaglutide(0.25 or 0.5mg -dos)], Tape, Ceftriaxone, and Erythromycin    Review of Systems   Review of Systems  Physical Exam Updated Vital Signs BP (!) 185/79 (BP Location: Right Arm)   Pulse (!) 55   Temp 97.8 F (36.6 C)   Resp 20   Ht 5\' 1"  (1.549 m)   Wt 72.6 kg   SpO2 100%   BMI 30.23 kg/m  Physical Exam  ED Results / Procedures / Treatments   Labs (all labs ordered are listed, but only abnormal results are displayed) Labs Reviewed  BASIC METABOLIC PANEL - Abnormal; Notable for the following components:      Result Value   CO2 21 (*)    Glucose, Bld 55 (*)    BUN 42 (*)    Creatinine, Ser 2.83 (*)    GFR, Estimated 19 (*)    All other components within normal limits  CBC - Abnormal; Notable for the following components:   RBC 3.52 (*)    Hemoglobin 10.9 (*)    HCT 32.2 (*)    All other components within normal limits  CBG MONITORING, ED - Abnormal; Notable for the following components:   Glucose-Capillary 48 (*)    All other components within normal limits  CBG MONITORING, ED  TROPONIN I (HIGH SENSITIVITY)  TROPONIN I (HIGH SENSITIVITY)    EKG EKG Interpretation  Date/Time:  Tuesday August 31 2022 17:37:44 EDT Ventricular Rate:  61 PR Interval:  162 QRS Duration: 70 QT Interval:  420 QTC Calculation: 422 R Axis:   56 Text Interpretation: Normal sinus rhythm Septal infarct (cited on or before 03-Sep-2021) Abnormal ECG When  compared with ECG of 17-Feb-2022 12:02, No significant change was found Confirmed by Regan Lemming ([id number]) on 08/31/2022 5:48:17 PM  Radiology No results found.  Procedures Procedures  {Document cardiac monitor, telemetry assessment procedure when appropriate:1}  Medications Ordered in ED Medications  ondansetron (ZOFRAN) injection 4 mg (4 mg Intravenous Given 08/31/22 1907)    ED Course/ Medical Decision Making/ A&P                           Medical Decision Making Amount and/or Complexity of Data Reviewed Labs: ordered.  Risk Prescription drug management.   ***  {Document critical  care time when appropriate:1} {Document review of labs and clinical decision tools ie heart score, Chads2Vasc2 etc:1}  {Document your independent review of radiology images, and any outside records:1} {Document your discussion with family members, caretakers, and with consultants:1} {Document social determinants of health affecting pt's care:1} {Document your decision making why or why not admission, treatments were needed:1} Final Clinical Impression(s) / ED Diagnoses Final diagnoses:  None    Rx / DC Orders ED Discharge Orders     None

## 2022-08-31 NOTE — ED Notes (Signed)
US at the Bedside. °

## 2022-08-31 NOTE — Discharge Instructions (Addendum)
D/C to ED via POV

## 2022-08-31 NOTE — H&P (Addendum)
Hospital Admission History and Physical Service Pager: [phone]  Patient name: [name] [name] Medical record number: [id number] Date of Birth: 05-Jan-1966 Age: 56 y.o. Gender: female  Primary Care Provider: McDiarmid, Blane Ohara, MD Consultants: none Code Status: DNR/DNI Preferred Emergency Contact:  Contact Information     Name Relation Home Work [place] Significant other [phone]  [phone]   [name],[name] Sister [phone]  [phone]       Chief Complaint: Epigastric pain, vomiting   Assessment and Plan: [name] [name] is a 56 y.o. female with PMHx chronic pancreatitis and cyclic vomiting syndrome presents with epigastric pain. Likely due to chronic cyclic vomiting flares in the setting of chronic pancreatitis and less likely acute pancreatitis (less likely given baseline high lipase, negative CT) and stomach ulcer (no hx or bloody sputum, negative CT).  Cyclic vomiting syndrome Chronic issue with acute flare in the setting of chronic pancreatitis. Baseline lipase 150-160, mildly elevated on admission. CT unremarkable for acute pancreatitis.  -Admit to [facility], med-tele, attending Dr. Fortunato Curling  -Per PCP, effective combination therapy for flare of cyclic vomiting syndrome is dilaudid 0.5 mg IV q 4 hrs x 6 doses, phenergan 12.5 mg q 6 hr x 6 doses, and ativan 1 mg q6h x 6 doses -Continue home meds: Mirtazapine 7.'5mg'$  daily, Lubiprostone 18mg -Zofran ODT 4 mg q8h PRN nausea -Start NS @ 57mhr for rehydration   Hypertension associated with diabetes ([place] elevated prior to admission to 218/96. Decreased to 170s/90s. -Start patient on home med: hydralazine '10mg'$  TID, Coreg 12.'5mg'$  BID -Seen by nephrology outpatient -Per PCP, patient BP issues typically improve when pain treated -BP goal <160/110  Type 2 diabetes mellitus with diabetic chronic kidney disease (HCC) Takes Lantus 23U and linagliptin at home. Most recent A1c last month was at goal  <7. -Start Semglee 10 U QHS, adjust as needed -sSSI  -CBG AC and QHS   Skin disorder Diagnosed with atopic neurodermatitis with prurigo nodularis by dermatology in 02/2022. On dupixent injection q 14 days at home. -Restart home meds: Bactroban 2% -Start hydrocortisone cream prn itching  Chronic low back pain Patient with some chronic back pain on exam. -Start Lidocaine 5% patch  Takes oxycodone 5 mg q4hrPRN for severe pain  Chronic conditions: Mood disorder: continue on Seroquel '50mg'$  daily  FEN/GI: Carb modified  VTE Prophylaxis: Lovenox '30mg'$   Disposition: pending   History of Present Illness:  GlLYNNE TAKEMOTOs a [age].o. female presenting with 8/10 epigastric pain, dull and achy. Started yesterday when at Urgent Care getting assessed for another issues. States her stomach "doesn't feel too good".  Feels like gagging but doesn't want to throw up. Denies vomiting. States she is drinking fluids but not eating very much. Patient also complains of HA, chronic back pain and some leg swelling. Denies chest pain  Patient also concerned about her dermatologic issue that she has been dealing with it x months. Saw derm this year and on medication but has not had improvement. States she has lesions on ankle, back and forearms. Describes them at itchy and states the itching bothers her.  Review Of Systems: as above  Pertinent Past Medical History: CKD stage IV with proteinuria  Delayed gastric emptying  Type 2 Diabetes HLD HTN Esophagitis  Cyclic vomiting syndrome  Chronic pain  Remainder reviewed in history tab.   Pertinent Past Surgical History: Hysterectomy  Cholecystecomy  Laporotomy with lysis of adhesions   Remainder reviewed in history tab.  Pertinent Social  History: Tobacco use: Yes, 1/3 pack/day Alcohol use: none Other Substance use: Yes, marijuana 2-3 times per week Lives alone    Pertinent Family History: Diabetes, MI, HTN, kidney disease in mother   Remainder  reviewed in history tab.   Important Outpatient Medications: Dupixent '300mg'$  q14days  Lantus 23U daily  Tradjenta '5mg'$  daily  Ativan '1mg'$  BID PRN  Remeron 7.5 QHS  Oxycodone '5mg'$  Q4H PRN  Phenergan '25mg'$  PRN  Seroquel '50mg'$  QHS  Carvedilol 12.5 BID  Diltiazem '180mg'$  QAM, QHS  Hydralazine '10mg'$  TID   Remainder reviewed in medication history.   Objective: BP (!) 178/70 (BP Location: Right Arm)   Pulse (!) 55   Temp 98.4 F (36.9 C) (Oral)   Resp 19   Ht '5\' 1"'$  (1.549 m)   Wt 72.6 kg   SpO2 100%   BMI 30.23 kg/m  Exam: General: Awake, alert, oriented, in no acute distress, pleasant and cooperative with examination HEENT: Normocephalic, atraumatic, nares patent, dentition is good, oropharynx without erythema or exudates Cardio: RRR without murmur Respiratory: CTAB without wheezing/rhonchi/rales Abdomen: Soft, non-distended, tender to minimal palpation epigastric region  MSK: Able to move all extremities spontaneously, normal gait Skin: Scattered slightly raised circular areas of hyerpigmentation on upper back. Right arm and medial left ankle with ulcerated area (see photo below). No surrounding erythema.  Neuro: Speech is clear and intact, no focal deficits, no facial asymmetry, follows commands  Psych: Intermittently tearful, slightly depressed affect        Labs:  CBC BMET  Recent Labs  Lab 09/01/22 0458  WBC 6.1  HGB 11.1*  HCT 32.2*  PLT 238   Recent Labs  Lab 09/01/22 0458  NA 138  K 4.4  CL 110  CO2 21*  BUN 38*  CREATININE 2.58*  GLUCOSE 105*  CALCIUM 9.7     Pertinent additional labs lipase 175, UA unremarkable  EKG: NSR      Imaging Studies Performed: US Aorta  Result Date: 08/31/2022 CLINICAL DATA:  Abdomen pain EXAM: ULTRASOUND OF ABDOMINAL AORTA TECHNIQUE: Ultrasound examination of the abdominal aorta and proximal common iliac arteries was performed to evaluate for aneurysm. Additional color and Doppler images of the distal aorta were obtained to  document patency. COMPARISON:  CT 08/31/2022 FINDINGS: Abdominal aortic measurements as follows: Proximal:  2.180 x 2 transverse cm Mid:  1.9 AP by 1.7 transverse cm Distal:  1.6 AP by 1.6 transverse cm Patent: Yes, peak systolic velocity is 119.1 cm/s Right common iliac artery: 0.7 cm cm Left common iliac artery: 1 cm cm IMPRESSION: Negative for aortic aneurysm. Grossly patent aorta. Limited visualization of the iliac vessels due to bowel gas. Electronically Signed   By: Donavan Foil M.D.   On: 08/31/2022 23:21   CT ABDOMEN PELVIS WO CONTRAST  Result Date: 08/31/2022 CLINICAL DATA:  Abdominal pain, acute, nonlocalized EXAM: CT ABDOMEN AND PELVIS WITHOUT CONTRAST TECHNIQUE: Multidetector CT imaging of the abdomen and pelvis was performed following the standard protocol without IV contrast. RADIATION DOSE REDUCTION: This exam was performed according to the departmental dose-optimization program which includes automated exposure control, adjustment of the mA and/or kV according to patient size and/or use of iterative reconstruction technique. COMPARISON:  06/11/2021 FINDINGS: Lower chest: Lung bases are clear. Hepatobiliary: No evidence of focal hepatic abnormality on this unenhanced exam. Clips in the gallbladder fossa postcholecystectomy. No biliary dilatation. Pancreas: No ductal dilatation or inflammation. Spleen: Normal in size without focal abnormality. Adrenals/Urinary Tract: Normal adrenal glands. Mild prominence of both renal  collecting systems and ureters without frank hydronephrosis. There is symmetric bilateral perinephric edema. No renal calculi. Unremarkable urinary bladder. Stomach/Bowel: Small hiatal hernia. Unremarkable stomach. Equivocal wall thickening about the distal ileum, but no perienteric inflammation. No obstruction. Moderate volume of stool in the colon. The appendix is normal. Vascular/Lymphatic: Moderate aortic atherosclerosis. No aneurysm. No adenopathy. Reproductive: Status post  hysterectomy. No adnexal masses. Other: Postsurgical change of the anterior abdominal wall. No free air, free fluid, or intra-abdominal fluid collection. Musculoskeletal: Minimal L4-L5 disc bulge. There are no acute or suspicious osseous abnormalities. IMPRESSION: 1. Equivocal wall thickening about the distal ileum, possible enteritis. No significant phlegm a tori change. 2. Mild prominence of both renal collecting systems and ureters without frank hydronephrosis, possibly chronic, however recommend correlation with urinalysis. 3. Small hiatal hernia. Aortic Atherosclerosis (ICD10-I70.0). Electronically Signed   By: Keith Rake M.D.   On: 08/31/2022 21:54     Colletta Maryland, MD 09/01/2022, 12:50 PM PGY-1, [place] Intern pager: [phone], text pages welcome Secure chat group [facility] Teaching Service    Upper Level Addendum:  I have seen and evaluated this patient along with Dr. Jerilee Hoh and reviewed the above note, making necessary revisions as appropriate.  I agree with the medical decision making and physical exam as noted above.  Gentle fluids and combination therapy with pain control and anti-emetics  Monitor for systemic symptoms    Erskine Emery, MD PGY-2 [facility] Family Medicine Residency

## 2022-08-31 NOTE — ED Triage Notes (Signed)
Pt reports possible insect bites all over. States this has been reoccurring and has scabs in various area. Reports has been to dermatology appointments and has only been given cream and medications that has not been effective.   Also reports mid abdominal pain States have had cyclic vomiting syndrome for the past 20 years.

## 2022-08-31 NOTE — ED Notes (Signed)
CBG re-checked, found to be 48, pt w/ patent airway, CAOx4, 8oz fruit juice given

## 2022-08-31 NOTE — ED Triage Notes (Signed)
Patient reports to the ER for hypertension. Patient reports she was at [facility] and she has a hole in her leg and she has back pain and stomach pain.

## 2022-08-31 NOTE — H&P (Incomplete)
Hospital Admission History and Physical Service Pager: [phone]  Patient name: [name] [name] Medical record number: [id number] Date of Birth: May 14, 1966 Age: 56 y.o. Gender: female  Primary Care Provider: McDiarmid, Blane Ohara, MD Consultants: none Code Status: *** which was confirmed with family if patient unable to confirm ***  Preferred Emergency Contact: Vanita Panda [phone]  Chief Complaint: uncontrolled HTN, epigastric pain radiating to back, ulcerative lesions on arms and legs   Assessment and Plan: [name] [name] is a 56 y.o. female presenting with *** . Differential for presentation of this includes ***.   No notes have been filed under this hospital service. Service: Family Medicine  Acute pancreatitis  Meets diagnosis of acute pancreatitis with elevation in serum lipase and epigastric tenderness radiating to back. Of note, she has had elevated lipase (150-160) since 05/2021. Differential also includes chronic cyclic vomiting due to ongoing MJ use. She has had similar prior episodes, last in 08/2021 and then 1-2 mo prior and was seen in ED. No imaging findings of acute pancreatitis.  -IVF -pain control  -start low-fat soft diet once pain improves  -AM BMP, CBC  -home phenergan -PT/OT   HTN urgency  -restarted home meds   Atopic neurodermatitis with prurigo nodularis  Diagnosed by dermatology in 02/2022 however she is continuing to report delusions of bDifferential also includes delusional parasitosis. She has been unwilling to restart home dose of seroquel in prior PCP visits.  -seen by dermatology 02/2022  -recommended dupixent injection  -re-evaluated by PCP in 06/2022 with continued complaints of bug biting her and causing skin sensation.  -unwilling to start seroquel at the time.   Chronic problems:  Cyclic vomiting syndrome: ativan rescue meds, mirtazapine Type 2 Diabetes: Lantus 23U daily, linagliptin and tradjenta  HTN: hydralazine '10mg'$  TID, carvedilol   Diabetic neuropathy  Mood disorder: mirtazapine  FEN/GI: NPO for now, re-evaluate in AM  VTE Prophylaxis: low dose lovenox   Disposition: pending   History of Present Illness:  [name] [name] is a 56 y.o. female presenting with ***    Initially presented to urgent care with BP 228/96 with taking all her BP meds and 10/10 headache, referred to ED. In the ED, epigastric abdominal tenderness on exam with lipase 175. CBG 48. CTAP without contrast given h/o CKD, no finding of acute pancreatitis. Has been hypertensive, AA U/S normal. Cr 2.8. Hgb 10.9. Restarted on home HTN meds.   Review Of Systems: Per HPI with the following additions: ***  Pertinent Past Medical History: CKD stage IV with proteinuria  Delayed gastric emptying  Type 2 Diabetes HLD HTN Esophagitis  Cyclic vomiting syndrome  Chronic pain  Remainder reviewed in history tab.   Pertinent Past Surgical History: Hysterectomy  Cholecystecomy  Laporotomy with lysis of adhesions   Remainder reviewed in history tab.  Pertinent Social History: Tobacco use: Yes, 2 cigarettes per day Alcohol use: yes, rare Other Substance use: Yes, MJ daily   Lives alone in [place] with daughter and 2 grandchildren nearby.   Pertinent Family History: Diabetes, MI, HTN, kidney disease in mother   Remainder reviewed in history tab.   Important Outpatient Medications: Dupixent '300mg'$  q14days  Flexeril 5-'10mg'$   Lantus 23U daily  Tradjenta '5mg'$  daily  Ativan '1mg'$  BID PRN  Remeron 7.5 QHS  Oxycodone '5mg'$  Q4H PRN  Phenergan '25mg'$  PRN  Seroquel '50mg'$  QHS  Zanaflex 2 mg Q8H PRN  Carvedilol 12.5 BID  Diltiazem '180mg'$  QAM, QHS  Hydralazine '10mg'$  TID   Remainder reviewed  in medication history.   Objective: BP (!) 178/80 (BP Location: Right Arm)   Pulse 61   Temp 97.9 F (36.6 C) (Oral)   Resp 19   Ht '5\' 1"'$  (1.549 m)   Wt 72.6 kg   SpO2 100%   BMI 30.23 kg/m  Exam: General: *** Eyes: *** ENTM: *** Neck: *** Cardiovascular:  *** Respiratory: *** Gastrointestinal: *** MSK: *** Derm: *** Neuro: *** Psych: ***  Labs:  CBC BMET  Recent Labs  Lab 08/31/22 1752  WBC 6.1  HGB 10.9*  HCT 32.2*  PLT 236   Recent Labs  Lab 08/31/22 1752  NA 140  K 4.5  CL 111  CO2 21*  BUN 42*  CREATININE 2.83*  GLUCOSE 55*  CALCIUM 9.5    Pertinent additional labs lipase 175, UA unremarkable  EKG: My own interpretation (not copied from electronic read): NSR      Imaging Studies Performed:  Imaging Study (ie. Chest x-ray) Impression from Radiologist:  CTAP wo contrast: 1. Equivocal wall thickening about the distal ileum, possible enteritis. No significant phlegm a tori change. 2. Mild prominence of both renal collecting systems and ureters without frank hydronephrosis, possibly chronic, however recommend correlation with urinalysis. 3. Small hiatal hernia.  Aortic Atherosclerosis  AA U/S: Negative for aortic aneurysm. Grossly patent aorta. Limited visualization of the iliac vessels due to bowel gas.   My Interpretation:   Rolanda Lundborg, MD 08/31/2022, 11:12 PM PGY-1, [place] Intern pager: [phone], text pages welcome Secure chat group [place]

## 2022-08-31 NOTE — ED Provider Notes (Signed)
Presents to urgent care with abdominal pain and headache. Reports 10/10 abdominal pain, she feels it radiates into her back.  Blood pressure is 228/96. She has taken her BP meds today. Additionally headache is reported as 10/10  No dizziness, chest pain, shortness of breath. She is not in acute distress. Bradycardic but this appears to be her baseline.  Hypertensive urgency. Needs to be evaluated in the emergency department for full workup. Patient understands urgent care does not have the resources available to evaluate this at this time. Discharged to ED via personal vehicle.    Les Pou, [place] 08/31/22 1636

## 2022-09-01 DIAGNOSIS — S29012A Strain of muscle and tendon of back wall of thorax, initial encounter: Secondary | ICD-10-CM | POA: Diagnosis present

## 2022-09-01 DIAGNOSIS — E118 Type 2 diabetes mellitus with unspecified complications: Secondary | ICD-10-CM | POA: Diagnosis not present

## 2022-09-01 DIAGNOSIS — M542 Cervicalgia: Secondary | ICD-10-CM | POA: Diagnosis present

## 2022-09-01 DIAGNOSIS — Z66 Do not resuscitate: Secondary | ICD-10-CM | POA: Diagnosis present

## 2022-09-01 DIAGNOSIS — E1122 Type 2 diabetes mellitus with diabetic chronic kidney disease: Secondary | ICD-10-CM | POA: Diagnosis present

## 2022-09-01 DIAGNOSIS — Z794 Long term (current) use of insulin: Secondary | ICD-10-CM | POA: Diagnosis not present

## 2022-09-01 DIAGNOSIS — L281 Prurigo nodularis: Secondary | ICD-10-CM | POA: Diagnosis present

## 2022-09-01 DIAGNOSIS — K859 Acute pancreatitis without necrosis or infection, unspecified: Secondary | ICD-10-CM | POA: Insufficient documentation

## 2022-09-01 DIAGNOSIS — E785 Hyperlipidemia, unspecified: Secondary | ICD-10-CM | POA: Diagnosis present

## 2022-09-01 DIAGNOSIS — M545 Low back pain, unspecified: Secondary | ICD-10-CM | POA: Diagnosis present

## 2022-09-01 DIAGNOSIS — K3184 Gastroparesis: Secondary | ICD-10-CM | POA: Diagnosis not present

## 2022-09-01 DIAGNOSIS — E1159 Type 2 diabetes mellitus with other circulatory complications: Secondary | ICD-10-CM | POA: Diagnosis not present

## 2022-09-01 DIAGNOSIS — N184 Chronic kidney disease, stage 4 (severe): Secondary | ICD-10-CM | POA: Diagnosis present

## 2022-09-01 DIAGNOSIS — Z79899 Other long term (current) drug therapy: Secondary | ICD-10-CM | POA: Diagnosis not present

## 2022-09-01 DIAGNOSIS — K861 Other chronic pancreatitis: Secondary | ICD-10-CM | POA: Diagnosis present

## 2022-09-01 DIAGNOSIS — L2081 Atopic neurodermatitis: Secondary | ICD-10-CM | POA: Diagnosis present

## 2022-09-01 DIAGNOSIS — F39 Unspecified mood [affective] disorder: Secondary | ICD-10-CM | POA: Diagnosis present

## 2022-09-01 DIAGNOSIS — E1143 Type 2 diabetes mellitus with diabetic autonomic (poly)neuropathy: Secondary | ICD-10-CM | POA: Diagnosis present

## 2022-09-01 DIAGNOSIS — G8929 Other chronic pain: Secondary | ICD-10-CM | POA: Diagnosis not present

## 2022-09-01 DIAGNOSIS — G894 Chronic pain syndrome: Secondary | ICD-10-CM | POA: Diagnosis present

## 2022-09-01 DIAGNOSIS — R1115 Cyclical vomiting syndrome unrelated to migraine: Secondary | ICD-10-CM | POA: Diagnosis present

## 2022-09-01 DIAGNOSIS — M62838 Other muscle spasm: Secondary | ICD-10-CM | POA: Diagnosis not present

## 2022-09-01 DIAGNOSIS — M25512 Pain in left shoulder: Secondary | ICD-10-CM | POA: Diagnosis present

## 2022-09-01 DIAGNOSIS — E11649 Type 2 diabetes mellitus with hypoglycemia without coma: Secondary | ICD-10-CM | POA: Diagnosis not present

## 2022-09-01 DIAGNOSIS — R001 Bradycardia, unspecified: Secondary | ICD-10-CM | POA: Diagnosis present

## 2022-09-01 DIAGNOSIS — F32A Depression, unspecified: Secondary | ICD-10-CM | POA: Diagnosis present

## 2022-09-01 DIAGNOSIS — M7989 Other specified soft tissue disorders: Secondary | ICD-10-CM | POA: Diagnosis present

## 2022-09-01 DIAGNOSIS — X58XXXA Exposure to other specified factors, initial encounter: Secondary | ICD-10-CM | POA: Diagnosis present

## 2022-09-01 DIAGNOSIS — E1165 Type 2 diabetes mellitus with hyperglycemia: Secondary | ICD-10-CM | POA: Diagnosis present

## 2022-09-01 DIAGNOSIS — F17210 Nicotine dependence, cigarettes, uncomplicated: Secondary | ICD-10-CM | POA: Diagnosis not present

## 2022-09-01 DIAGNOSIS — E1169 Type 2 diabetes mellitus with other specified complication: Secondary | ICD-10-CM | POA: Diagnosis present

## 2022-09-01 DIAGNOSIS — I152 Hypertension secondary to endocrine disorders: Secondary | ICD-10-CM | POA: Diagnosis present

## 2022-09-01 HISTORY — DX: Acute pancreatitis without necrosis or infection, unspecified: K85.90

## 2022-09-01 LAB — COMPREHENSIVE METABOLIC PANEL
ALT: 5 U/L (ref 0–44)
AST: 12 U/L — ABNORMAL LOW (ref 15–41)
Albumin: 3.9 g/dL (ref 3.5–5.0)
Alkaline Phosphatase: 71 U/L (ref 38–126)
Anion gap: 7 (ref 5–15)
BUN: 38 mg/dL — ABNORMAL HIGH (ref 6–20)
CO2: 21 mmol/L — ABNORMAL LOW (ref 22–32)
Calcium: 9.7 mg/dL (ref 8.9–10.3)
Chloride: 110 mmol/L (ref 98–111)
Creatinine, Ser: 2.58 mg/dL — ABNORMAL HIGH (ref 0.44–1.00)
GFR, Estimated: 21 mL/min — ABNORMAL LOW (ref >60)
Glucose, Bld: 105 mg/dL — ABNORMAL HIGH (ref 70–99)
Potassium: 4.4 mmol/L (ref 3.5–5.1)
Sodium: 138 mmol/L (ref 135–145)
Total Bilirubin: 0.5 mg/dL (ref 0.3–1.2)
Total Protein: 6.6 g/dL (ref 6.5–8.1)

## 2022-09-01 LAB — HIV ANTIBODY (ROUTINE TESTING W REFLEX): HIV Screen 4th Generation wRfx: NONREACTIVE

## 2022-09-01 LAB — CBC
HCT: 32.2 % — ABNORMAL LOW (ref 36.0–46.0)
Hemoglobin: 11.1 g/dL — ABNORMAL LOW (ref 12.0–15.0)
MCH: 31.5 pg (ref 26.0–34.0)
MCHC: 34.5 g/dL (ref 30.0–36.0)
MCV: 91.5 fL (ref 80.0–100.0)
Platelets: 238 10*3/uL (ref 150–400)
RBC: 3.52 MIL/uL — ABNORMAL LOW (ref 3.87–5.11)
RDW: 14.6 % (ref 11.5–15.5)
WBC: 6.1 10*3/uL (ref 4.0–10.5)
nRBC: 0 % (ref 0.0–0.2)

## 2022-09-01 LAB — GLUCOSE, CAPILLARY
Glucose-Capillary: 160 mg/dL — ABNORMAL HIGH (ref 70–99)
Glucose-Capillary: 87 mg/dL (ref 70–99)
Glucose-Capillary: 106 mg/dL — ABNORMAL HIGH (ref 70–99)

## 2022-09-01 MED ORDER — HYDROCORTISONE 1 % EX CREA
TOPICAL_CREAM | Freq: Two times a day (BID) | CUTANEOUS | Status: DC
  Filled 2022-09-01: qty 28

## 2022-09-01 MED ORDER — SODIUM CHLORIDE 0.9 % IV SOLN: INTRAVENOUS | Status: DC

## 2022-09-01 MED ORDER — MIRTAZAPINE 15 MG PO TABS
7.5000 mg | ORAL_TABLET | Freq: Every day | ORAL | Status: DC
  Administered 2022-09-01 – 2022-09-03 (×3): 7.5 mg via ORAL
  Filled 2022-09-01 (×3): qty 1

## 2022-09-01 MED ORDER — CARVEDILOL 12.5 MG PO TABS
12.5000 mg | ORAL_TABLET | Freq: Two times a day (BID) | ORAL | Status: DC
  Administered 2022-09-01: 12.5 mg via ORAL
  Filled 2022-09-01 (×3): qty 1

## 2022-09-01 MED ORDER — HYDROMORPHONE HCL 1 MG/ML IJ SOLN
0.5000 mg | INTRAMUSCULAR | Status: AC
  Administered 2022-09-01 – 2022-09-02 (×6): 0.5 mg via INTRAVENOUS
  Filled 2022-09-01 (×6): qty 1

## 2022-09-01 MED ORDER — INSULIN ASPART 100 UNIT/ML IJ SOLN
0.0000 [IU] | Freq: Three times a day (TID) | INTRAMUSCULAR | Status: DC
  Administered 2022-09-01: 2 [IU] via SUBCUTANEOUS
  Administered 2022-09-03: 3 [IU] via SUBCUTANEOUS
  Administered 2022-09-03: 2 [IU] via SUBCUTANEOUS
  Administered 2022-09-04: 1 [IU] via SUBCUTANEOUS

## 2022-09-01 MED ORDER — LIDOCAINE 5 % EX PTCH
1.0000 | MEDICATED_PATCH | CUTANEOUS | Status: DC
  Administered 2022-09-01 – 2022-09-04 (×4): 1 via TRANSDERMAL
  Filled 2022-09-01 (×4): qty 1

## 2022-09-01 MED ORDER — MUPIROCIN CALCIUM 2 % EX CREA
1.0000 | TOPICAL_CREAM | Freq: Two times a day (BID) | CUTANEOUS | Status: DC
  Administered 2022-09-01 – 2022-09-04 (×6): 1 via TOPICAL
  Filled 2022-09-01: qty 15

## 2022-09-01 MED ORDER — DILTIAZEM HCL ER COATED BEADS 180 MG PO CP24
180.0000 mg | ORAL_CAPSULE | Freq: Two times a day (BID) | ORAL | Status: DC
  Administered 2022-09-01 – 2022-09-03 (×6): 180 mg via ORAL
  Filled 2022-09-01 (×7): qty 1

## 2022-09-01 MED ORDER — PROMETHAZINE HCL 25 MG PO TABS
12.5000 mg | ORAL_TABLET | Freq: Four times a day (QID) | ORAL | Status: AC
  Administered 2022-09-01 – 2022-09-04 (×12): 12.5 mg via ORAL
  Filled 2022-09-01 (×12): qty 1

## 2022-09-01 MED ORDER — ONDANSETRON 4 MG PO TBDP: 4.0000 mg | ORAL_TABLET | Freq: Three times a day (TID) | ORAL | Status: DC | PRN

## 2022-09-01 MED ORDER — QUETIAPINE FUMARATE 50 MG PO TABS
50.0000 mg | ORAL_TABLET | Freq: Every day | ORAL | Status: DC
  Administered 2022-09-01 – 2022-09-03 (×3): 50 mg via ORAL
  Filled 2022-09-01 (×3): qty 1

## 2022-09-01 MED ORDER — ORAL CARE MOUTH RINSE: 15.0000 mL | OROMUCOSAL | Status: DC | PRN

## 2022-09-01 MED ORDER — INSULIN GLARGINE-YFGN 100 UNIT/ML ~~LOC~~ SOLN
10.0000 [IU] | Freq: Every day | SUBCUTANEOUS | Status: DC
  Administered 2022-09-01 – 2022-09-02 (×2): 10 [IU] via SUBCUTANEOUS
  Filled 2022-09-01 (×3): qty 0.1

## 2022-09-01 MED ORDER — FENTANYL CITRATE PF 50 MCG/ML IJ SOSY
50.0000 ug | PREFILLED_SYRINGE | Freq: Once | INTRAMUSCULAR | Status: AC
  Administered 2022-09-01: 50 ug via INTRAVENOUS
  Filled 2022-09-01: qty 1

## 2022-09-01 MED ORDER — LUBIPROSTONE 24 MCG PO CAPS
24.0000 ug | ORAL_CAPSULE | Freq: Every day | ORAL | Status: DC
  Administered 2022-09-02 – 2022-09-04 (×3): 24 ug via ORAL
  Filled 2022-09-01 (×3): qty 1

## 2022-09-01 MED ORDER — OXYCODONE HCL 5 MG PO TABS
5.0000 mg | ORAL_TABLET | ORAL | Status: DC | PRN
  Administered 2022-09-01: 5 mg via ORAL
  Filled 2022-09-01: qty 1

## 2022-09-01 MED ORDER — PROMETHAZINE HCL 25 MG PO TABS: 25.0000 mg | ORAL_TABLET | Freq: Four times a day (QID) | ORAL | Status: DC | PRN

## 2022-09-01 MED ORDER — LORAZEPAM 2 MG/ML IJ SOLN
1.0000 mg | Freq: Four times a day (QID) | INTRAMUSCULAR | Status: DC
  Administered 2022-09-01 – 2022-09-03 (×8): 1 mg via INTRAVENOUS
  Filled 2022-09-01 (×8): qty 1

## 2022-09-01 NOTE — Progress Notes (Signed)
Called to teaching service to verify scheduled dosage of Hydromorphone and ativan.  Will be scheduled and not PRN at this point.

## 2022-09-01 NOTE — Assessment & Plan Note (Addendum)
BGL consistently in 100s. One episode of BGL 40, asymptomatic and increased to 60s. Takes Lantus 23U and linagliptin at home. -Decrease to Semglee 8 U QHS due to hypoglycemic episode -CBG AC and QHS -Cr slowly increasing, continue to monitor as PO intake increase

## 2022-09-01 NOTE — ED Notes (Signed)
Handoff report given to carelink 

## 2022-09-01 NOTE — Assessment & Plan Note (Addendum)
Stable. Continue on Bactroban 2% and hydrocortisone cream prn itching.

## 2022-09-01 NOTE — TOC Initial Note (Signed)
Transition of Care [facility]) - Initial/Assessment Note    Patient Details  Name: [name] [name] MRN: [id number] Date of Birth: 1966/07/14  [facility] CM/SW Contact:    Tom-Johnson, Renea Ee, RN Phone Number: 09/01/2022, 1:48 PM  Clinical Narrative:                  CM spoke with patient at bedside about needs for post hospital transition. Admitted for Pancreatitis, on scheduled IV Dilaudid and scheduled Ativan. From home alone. Independent with care. Currently on disability. States she drove herself to [facility] at [facility] and will drive herself home at discharge. Has a shower seat at home.  PCP is McDiarmid, Blane Ohara, MD and uses [facility] on [address]. No TOC needs or recommendations noted at this time.  CM will continue to follow as patient progresses with care.     Expected Discharge Plan: Home/Self Care Barriers to Discharge: Continued Medical Work up   Patient Goals and CMS Choice Patient states their goals for this hospitalization and ongoing recovery are:: To return home CMS Medicare.gov Compare Post Acute Care list provided to:: Patient Choice offered to / list presented to : NA  Expected Discharge Plan and Services Expected Discharge Plan: Home/Self Care   Discharge Planning Services: CM Consult Post Acute Care Choice: NA Living arrangements for the past 2 months: Single Family Home                 DME Arranged: N/A DME Agency: NA       HH Arranged: NA HH Agency: NA        Prior Living Arrangements/Services Living arrangements for the past 2 months: Single Family Home Lives with:: Self Patient language and need for interpreter reviewed:: Yes Do you feel safe going back to the place where you live?: Yes      Need for Family Participation in Patient Care: Yes (Comment) Care giver support system in place?: Yes (comment) Current home services: DME (Shower seat.) Criminal Activity/Legal Involvement Pertinent to Current  Situation/Hospitalization: No - Comment as needed  Activities of Daily Living Home Assistive Devices/Equipment: None ADL Screening (condition at time of admission) Patient's cognitive ability adequate to safely complete daily activities?: Yes Is the patient deaf or have difficulty hearing?: No Does the patient have difficulty seeing, even when wearing glasses/contacts?: No Does the patient have difficulty concentrating, remembering, or making decisions?: No Patient able to express need for assistance with ADLs?: Yes Does the patient have difficulty dressing or bathing?: No Independently performs ADLs?: Yes (appropriate for developmental age) Does the patient have difficulty walking or climbing stairs?: No Weakness of Legs: None Weakness of Arms/Hands: None  Permission Sought/Granted Permission sought to share information with : Case Manager, Family Supports Permission granted to share information with : Yes, Verbal Permission Granted              Emotional Assessment Appearance:: Appears stated age Attitude/Demeanor/Rapport: Engaged, Gracious Affect (typically observed): Accepting, Appropriate, Calm, Hopeful, Pleasant Orientation: : Oriented to Self, Oriented to Place, Oriented to  Time, Oriented to Situation Alcohol / Substance Use: Not Applicable Psych Involvement: No (comment)  Admission diagnosis:  Pancreatitis [K85.90] Skin lesions [L98.9] Acute pancreatitis, unspecified complication status, unspecified pancreatitis type [K85.90] Patient Active Problem List   Diagnosis Date Noted   Pancreatitis 32/35/5732   Lichen simplex chronicus 03/25/2022   Long-term insulin use ([place]) 01/26/2022   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, mild stage 12/14/2021    Class: Acute  Type 2 diabetes mellitus with diabetic chronic kidney disease ([place])    Other atopic dermatitis 04/30/2021   Prurigo nodularis 04/16/2021   Diabetic retinopathy of both eyes without macular edema  associated with type 2 diabetes mellitus ([place]) 12/23/2020   Mood disorder ([place])    Skin disorder 05/23/2020   History of Left ventricular diastolic dysfunction C1KG 08/15/2018   Primary income source is SS Disability Income 03/10/2017   Secondary hyperparathyroidism of renal origin ([place]) 10/05/2016   Hypertension associated with diabetes ([place]) 05/01/2016   Diabetic gastroparesis associated with type 2 diabetes mellitus ([place]) 05/01/2016   Constipation 06/06/2015   Encounter for monitoring opioid maintenance therapy 01/06/2015   Vitamin D deficiency 11/28/2014   Hyperlipidemia associated with type 2 diabetes mellitus ([place]) 08/15/2014   Chronic kidney disease (CKD) stage G4/A2, severely decreased glomerular filtration rate (GFR) between 15-29 mL/min/1.73 square meter and albuminuria creatinine ratio between 30-299 mg/g (HCC) 08/17/2013   Seasonal allergic rhinitis 05/04/2012   Marijuana user 81/85/6314   Cyclic vomiting syndrome 08/03/2011   Chronic low back pain 04/10/2007   Tobacco abuse 02/23/2007   Chronic insomnia secondary to General Medical and Mood disorders 02/23/2007   Diabetic autonomic neuropathy ([place]) 02/23/2007   GERD with esophagitis 01/27/2004   PCP:  McDiarmid, Blane Ohara, MD Pharmacy:   [facility] [place], [address] AT [address] [place] [address] Phone: [phone] Fax: [phone], [facility] - [place], [address] [address] Phone: [phone] Fax: [phone]  [facility] [phone] - [place], [place] - [address] AT [address] [place] [address] Phone: [phone] Fax: [phone]     Social Determinants of Health (SDOH) Interventions    Readmission Risk Interventions     No data to display

## 2022-09-01 NOTE — Assessment & Plan Note (Addendum)
BP at goal. Some bradycardia. Somnolent on exam but likely due to pain medication. -Held Coreg 6.25 mg, per nursing. Continued and adjusted parameters to give unless pulse <30. -Continue home med: hydralazine '10mg'$  TID, Diltiazem CD 180 mg BID -Seen by nephrology outpatient

## 2022-09-01 NOTE — Assessment & Plan Note (Addendum)
Mostly resolved. Continue on Lidocaine 5% patch.

## 2022-09-01 NOTE — Assessment & Plan Note (Addendum)
Epigastric pain improved, feels stable enough to likely d/c tomorrow. Denies N/V. -Completed dilaudid 0.5 mg IV q 4 hrs x 12 doses. Restart home med Oxy '5mg'$  q6 prn pain -Completed Ativan 1 mg q6h x 12 doses. Restart home Ativan '1mg'$  BID prn. -Finishing Phenergan 12.5 mg q 6 hr x 12 doses. PRN Zofran when complete. Nausea stable.

## 2022-09-01 NOTE — ED Notes (Signed)
Consent to transfer form signed.

## 2022-09-02 DIAGNOSIS — E1122 Type 2 diabetes mellitus with diabetic chronic kidney disease: Secondary | ICD-10-CM

## 2022-09-02 DIAGNOSIS — N184 Chronic kidney disease, stage 4 (severe): Secondary | ICD-10-CM

## 2022-09-02 DIAGNOSIS — K859 Acute pancreatitis without necrosis or infection, unspecified: Secondary | ICD-10-CM | POA: Diagnosis not present

## 2022-09-02 DIAGNOSIS — R1115 Cyclical vomiting syndrome unrelated to migraine: Secondary | ICD-10-CM

## 2022-09-02 DIAGNOSIS — Z794 Long term (current) use of insulin: Secondary | ICD-10-CM

## 2022-09-02 DIAGNOSIS — E1159 Type 2 diabetes mellitus with other circulatory complications: Secondary | ICD-10-CM

## 2022-09-02 DIAGNOSIS — I152 Hypertension secondary to endocrine disorders: Secondary | ICD-10-CM

## 2022-09-02 LAB — BASIC METABOLIC PANEL
Anion gap: 8 (ref 5–15)
BUN: 39 mg/dL — ABNORMAL HIGH (ref 6–20)
CO2: 20 mmol/L — ABNORMAL LOW (ref 22–32)
Calcium: 9.3 mg/dL (ref 8.9–10.3)
Chloride: 112 mmol/L — ABNORMAL HIGH (ref 98–111)
Creatinine, Ser: 2.88 mg/dL — ABNORMAL HIGH (ref 0.44–1.00)
GFR, Estimated: 18 mL/min — ABNORMAL LOW (ref >60)
Glucose, Bld: 63 mg/dL — ABNORMAL LOW (ref 70–99)
Potassium: 4.8 mmol/L (ref 3.5–5.1)
Sodium: 140 mmol/L (ref 135–145)

## 2022-09-02 LAB — CBC
HCT: 30.4 % — ABNORMAL LOW (ref 36.0–46.0)
Hemoglobin: 10.3 g/dL — ABNORMAL LOW (ref 12.0–15.0)
MCH: 31.7 pg (ref 26.0–34.0)
MCHC: 33.9 g/dL (ref 30.0–36.0)
MCV: 93.5 fL (ref 80.0–100.0)
Platelets: 211 10*3/uL (ref 150–400)
RBC: 3.25 MIL/uL — ABNORMAL LOW (ref 3.87–5.11)
RDW: 14.7 % (ref 11.5–15.5)
WBC: 5.2 10*3/uL (ref 4.0–10.5)
nRBC: 0 % (ref 0.0–0.2)

## 2022-09-02 LAB — GLUCOSE, CAPILLARY
Glucose-Capillary: 80 mg/dL (ref 70–99)
Glucose-Capillary: 104 mg/dL — ABNORMAL HIGH (ref 70–99)
Glucose-Capillary: 97 mg/dL (ref 70–99)
Glucose-Capillary: 135 mg/dL — ABNORMAL HIGH (ref 70–99)

## 2022-09-02 MED ORDER — HYDROMORPHONE HCL 1 MG/ML IJ SOLN: 0.5000 mg | INTRAMUSCULAR | Status: DC | PRN

## 2022-09-02 MED ORDER — HYDROMORPHONE HCL 1 MG/ML IJ SOLN
0.5000 mg | INTRAMUSCULAR | Status: AC
  Administered 2022-09-02 – 2022-09-03 (×5): 0.5 mg via INTRAVENOUS
  Filled 2022-09-02 (×5): qty 1

## 2022-09-02 MED ORDER — ONDANSETRON HCL 4 MG/2ML IJ SOLN: 4.0000 mg | Freq: Three times a day (TID) | INTRAMUSCULAR | Status: DC | PRN

## 2022-09-02 MED ORDER — CARVEDILOL 6.25 MG PO TABS
6.2500 mg | ORAL_TABLET | Freq: Two times a day (BID) | ORAL | Status: DC
  Filled 2022-09-02: qty 1

## 2022-09-02 NOTE — Progress Notes (Signed)
     Daily Progress Note Intern Pager: [phone]  Patient name: [name] [name] Medical record number: [id number] Date of birth: September 29, 1966 Age: 56 y.o. Gender: female  Primary Care Provider: McDiarmid, Blane Ohara, MD Consultants: None Code Status: DNR/DNI  Pt Overview and Major Events to Date:  9/6: Admitted to FMTS  Assessment and Plan: 56 yo female with PMHx chronic pancreatitis and cyclic vomiting syndrome presents for flare of cyclic vomiting syndrome and possible acute on chronic pancreatitis.  Cyclic vomiting syndrome Epigastric pain unchanged from yesterday. Denies any additional sx. -Completed dilaudid 0.5 mg IV q 4 hrs x 6 doses. Will continue with another 6 doses for persistent pain -Continue PCP recommended regimen: phenergan 12.5 mg q 6 hr x 8 doses, and ativan 1 mg q6h x 8 doses -D/c fluids, good PO intake -Switch Zofran ODT 4 mg q8h PRN nausea to IV since patient won't tolerate PO    Hypertension associated with diabetes (HCC) BP stable. Persistently bradycardic overnight and this AM. Somnolent and reported fatigue on exam. -Decrease to Coreg 6.25 mg BID and continue to monitor HR -Continue home med: hydralazine '10mg'$  TID, Diltiazem CD 180 mg BID -Seen by nephrology outpatient -BP goal <160/110  Type 2 diabetes mellitus with diabetic chronic kidney disease (HCC) BGL well controlled. Takes Lantus 23U and linagliptin at home. -On Semglee 10 U QHS + sSSI  -CBG AC and QHS  -Monitor renal function, repeat BMP  Skin disorder Improved today. Continue on Bactroban 2% and hydrocortisone cream prn itching.  Chronic low back pain Continue to have some back pain on exam. Continue Lidocaine 5% patch.    FEN/GI: Advance PO intake, IV fluids PPx: Lovenox '30mg'$  Dispo: home pending improvement of pain  Subjective:  Patient assessed at bedside and appears fatigued. States she is still having  8.5/10 epigastric pain.  Objective: Temp:  [98 F (36.7 C)-98.6 F (37 C)]  98.6 F (37 C) (09/07 0504) Pulse Rate:  [44-53] 49 (09/07 0822) Resp:  [14-18] 16 (09/07 0822) BP: (103-165)/(45-57) 165/54 (09/07 0822) SpO2:  [97 %-100 %] 100 % (09/07 1478) Physical Exam: General: Somnolent, no acute distress Cardio: RRR without murmur, rub or gallop Respiratory: CTAB without wheezing/rhonchi/rales Abdomen: Soft, non-distended, guarding to minimal palpation of epigastric region  MSK: Able to move all extremities spontaneously, normal gait Skin: Scattered slightly raised circular areas of hyerpigmentation on upper back. Right arm and medial left ankle with ulcerated area. No surrounding erythema. Lesions covered by bandages.  Laboratory: Most recent CBC Lab Results  Component Value Date   WBC 5.2 09/02/2022   HGB 10.3 (L) 09/02/2022   HCT 30.4 (L) 09/02/2022   MCV 93.5 09/02/2022   PLT 211 09/02/2022   Most recent BMP    Latest Ref Rng & Units 09/02/2022    6:22 AM  BMP  Glucose 70 - 99 mg/dL 63   BUN 6 - 20 mg/dL 39   Creatinine 0.44 - 1.00 mg/dL 2.88   Sodium 135 - 145 mmol/L 140   Potassium 3.5 - 5.1 mmol/L 4.8   Chloride 98 - 111 mmol/L 112   CO2 22 - 32 mmol/L 20   Calcium 8.9 - 10.3 mg/dL 9.3      Colletta Maryland, MD 09/02/2022, 12:42 PM  PGY-1, [place] Intern pager: [phone], text pages welcome Secure chat group [place]

## 2022-09-03 DIAGNOSIS — K3184 Gastroparesis: Secondary | ICD-10-CM | POA: Diagnosis not present

## 2022-09-03 DIAGNOSIS — K859 Acute pancreatitis without necrosis or infection, unspecified: Secondary | ICD-10-CM | POA: Diagnosis not present

## 2022-09-03 DIAGNOSIS — R1115 Cyclical vomiting syndrome unrelated to migraine: Secondary | ICD-10-CM | POA: Diagnosis not present

## 2022-09-03 DIAGNOSIS — E1143 Type 2 diabetes mellitus with diabetic autonomic (poly)neuropathy: Secondary | ICD-10-CM | POA: Diagnosis not present

## 2022-09-03 LAB — BASIC METABOLIC PANEL
Anion gap: 8 (ref 5–15)
BUN: 40 mg/dL — ABNORMAL HIGH (ref 6–20)
CO2: 21 mmol/L — ABNORMAL LOW (ref 22–32)
Calcium: 9.1 mg/dL (ref 8.9–10.3)
Chloride: 108 mmol/L (ref 98–111)
Creatinine, Ser: 3.03 mg/dL — ABNORMAL HIGH (ref 0.44–1.00)
GFR, Estimated: 17 mL/min — ABNORMAL LOW (ref >60)
Glucose, Bld: 164 mg/dL — ABNORMAL HIGH (ref 70–99)
Potassium: 4.6 mmol/L (ref 3.5–5.1)
Sodium: 137 mmol/L (ref 135–145)

## 2022-09-03 LAB — CBC
HCT: 30.2 % — ABNORMAL LOW (ref 36.0–46.0)
Hemoglobin: 9.9 g/dL — ABNORMAL LOW (ref 12.0–15.0)
MCH: 30.7 pg (ref 26.0–34.0)
MCHC: 32.8 g/dL (ref 30.0–36.0)
MCV: 93.5 fL (ref 80.0–100.0)
Platelets: 198 10*3/uL (ref 150–400)
RBC: 3.23 MIL/uL — ABNORMAL LOW (ref 3.87–5.11)
RDW: 14.6 % (ref 11.5–15.5)
WBC: 4.9 10*3/uL (ref 4.0–10.5)
nRBC: 0 % (ref 0.0–0.2)

## 2022-09-03 LAB — GLUCOSE, CAPILLARY
Glucose-Capillary: 154 mg/dL — ABNORMAL HIGH (ref 70–99)
Glucose-Capillary: 112 mg/dL — ABNORMAL HIGH (ref 70–99)
Glucose-Capillary: 40 mg/dL — CL (ref 70–99)
Glucose-Capillary: 63 mg/dL — ABNORMAL LOW (ref 70–99)
Glucose-Capillary: 66 mg/dL — ABNORMAL LOW (ref 70–99)
Glucose-Capillary: 235 mg/dL — ABNORMAL HIGH (ref 70–99)
Glucose-Capillary: 116 mg/dL — ABNORMAL HIGH (ref 70–99)

## 2022-09-03 MED ORDER — OXYCODONE HCL 5 MG PO TABS
5.0000 mg | ORAL_TABLET | Freq: Four times a day (QID) | ORAL | Status: DC | PRN
  Administered 2022-09-03 (×2): 5 mg via ORAL
  Filled 2022-09-03 (×2): qty 1

## 2022-09-03 MED ORDER — OXYCODONE HCL 5 MG PO TABS
5.0000 mg | ORAL_TABLET | ORAL | Status: DC | PRN
  Administered 2022-09-03 – 2022-09-04 (×3): 5 mg via ORAL
  Filled 2022-09-03 (×3): qty 1

## 2022-09-03 MED ORDER — LORAZEPAM 1 MG PO TABS
1.0000 mg | ORAL_TABLET | Freq: Two times a day (BID) | ORAL | Status: DC | PRN
  Administered 2022-09-03: 1 mg via ORAL
  Filled 2022-09-03: qty 1

## 2022-09-03 MED ORDER — CARVEDILOL 6.25 MG PO TABS
6.2500 mg | ORAL_TABLET | Freq: Two times a day (BID) | ORAL | Status: DC
  Administered 2022-09-03 – 2022-09-04 (×2): 6.25 mg via ORAL
  Filled 2022-09-03 (×2): qty 1

## 2022-09-03 MED ORDER — INSULIN GLARGINE-YFGN 100 UNIT/ML ~~LOC~~ SOLN
8.0000 [IU] | Freq: Every day | SUBCUTANEOUS | Status: DC
  Administered 2022-09-03: 8 [IU] via SUBCUTANEOUS
  Filled 2022-09-03 (×2): qty 0.08

## 2022-09-03 NOTE — Progress Notes (Signed)
Daily Progress Note Intern Pager: [phone]  Patient name: [name] [name] Medical record number: [id number] Date of birth: [birth year] Age: 56 y.o. Gender: female  Primary Care Provider: McDiarmid, Blane Ohara, MD Consultants: None Code Status: Full code  Pt Overview and Major Events to Date:  9/6: Admitted to FMTS  Assessment and Plan: 56 yo female with PMHx chronic pancreatitis and cyclic vomiting syndrome presents for flare of cyclic vomiting syndrome and possible acute on chronic pancreatitis.  Cyclic vomiting syndrome Epigastric pain improved, feels stable enough to likely d/c tomorrow. Denies N/V. -Completed dilaudid 0.5 mg IV q 4 hrs x 12 doses. Restart home med Oxy '5mg'$  q6 prn pain -Completed Ativan 1 mg q6h x 12 doses. Restart home Ativan '1mg'$  BID prn. -Finishing Phenergan 12.5 mg q 6 hr x 12 doses. PRN Zofran when complete. Nausea stable.    Hypertension associated with diabetes ([place]) BP at goal. Some bradycardia. Somnolent on exam but likely due to pain medication. -Held Coreg 6.25 mg, per nursing. Continued and adjusted parameters to give unless pulse <30. -Continue home med: hydralazine '10mg'$  TID, Diltiazem CD 180 mg BID -Seen by nephrology outpatient  Type 2 diabetes mellitus with diabetic chronic kidney disease (HCC) BGL consistently in 100s. One episode of BGL 40, asymptomatic and increased to 60s. Takes Lantus 23U and linagliptin at home. -Decrease to Semglee 8 U QHS due to hypoglycemic episode -CBG AC and QHS -Cr slowly increasing, continue to monitor as PO intake increase  Skin disorder Stable. Continue on Bactroban 2% and hydrocortisone cream prn itching.  Chronic low back pain Mostly resolved. Continue on Lidocaine 5% patch.    FEN/GI: Normal diet PPx: Lovenox '30mg'$  Dispo:home pending improvement of pain  Subjective:  Patient assessed at bedside and appears somnolent. She periodically closes her eyes during conversation. States her epigastric pain  has significantly improved today, currently 5/10. Denies N/V. Had a BM yesterday. States her back pain is now minimal. Feels improved and would like to be considered for discharge tomorrow if she still feels well. Denies acute concerns.  Objective: Temp:  [98.2 F (36.8 C)-98.5 F (36.9 C)] 98.2 F (36.8 C) (09/08 0837) Pulse Rate:  [49-51] 49 (09/08 0837) Resp:  [16-18] 18 (09/08 0837) BP: (144-148)/(52-78) 148/64 (09/08 0837) SpO2:  [97 %-100 %] 100 % (09/08 0837) Physical Exam: General: Somnolent, no acute distress Cardio: RRR without murmur, rub or gallop Respiratory: CTAB without wheezing/rhonchi/rales Abdomen: Soft, non-distended, moderate pain to palpation of epigastric region without grimacing MSK: Able to move all extremities spontaneously Skin: Scattered slightly raised circular areas of hyerpigmentation on upper back. Right arm and medial left ankle with ulcerated area. No surrounding erythema. Lesions covered by bandages.    Laboratory: Most recent CBC Lab Results  Component Value Date   WBC 4.9 09/03/2022   HGB 9.9 (L) 09/03/2022   HCT 30.2 (L) 09/03/2022   MCV 93.5 09/03/2022   PLT 198 09/03/2022   Most recent BMP    Latest Ref Rng & Units 09/03/2022    9:06 AM  BMP  Glucose 70 - 99 mg/dL 164   BUN 6 - 20 mg/dL 40   Creatinine 0.44 - 1.00 mg/dL 3.03   Sodium 135 - 145 mmol/L 137   Potassium 3.5 - 5.1 mmol/L 4.6   Chloride 98 - 111 mmol/L 108   CO2 22 - 32 mmol/L 21   Calcium 8.9 - 10.3 mg/dL 9.1      Colletta Maryland, MD 09/03/2022, 12:51 PM  PGY-1,  [place] Intern pager: [phone], text pages welcome Secure chat group [place]

## 2022-09-03 NOTE — Progress Notes (Signed)
Received page from nursing that patient reported continued pain despite prn oxycodone dose, last received at 1900. She has an allergy listed to acetaminophen due to nausea and vomiting. Oxycodone was restarted today at PCP-recommended dose of 5 mg q6h PRN though it looks like the ambulatory prescription is written for q4h.  I explained that with the patient's intended goal to be able to go home tomorrow, restarting IV pain medication would not be a good option. She was previously on IV dilaudid, last dose of that this morning. I will adjust timing of oxycodone to q4h PRN to see if she finds relief with more frequent dosing.   Farrel Gordon, DO

## 2022-09-03 NOTE — Discharge Summary (Incomplete)
[facility] Discharge Summary  Patient name: [name] [name] Medical record number: [id number] Date of birth: 05/22/1966 Age: 56 y.o. Gender: female Date of Admission: 08/31/2022  Date of Discharge: 09/04/22 Admitting Physician: Shary Key, DO  Primary Care Provider: McDiarmid, Blane Ohara, MD Consultants: None  Indication for Hospitalization: Epigastric pain  Brief Hospital Course:  [name] [name] is a 55 y.o. female that presented with persistent nausea and vomiting with significant epigastric pain. Patient treated as acute on chronic pancreatitis and for flare of cyclic vomiting syndrome. PMH significant for CKD stage IV with proteinuria, delayed gastric emptying, W9QP, HLD, HTN, cyclic vomiting syndrome, chronic pain.   Cyclic vomiting syndrome Patient presented with acute flare of chronic issue cyclic vomiting in the setting of chronic pancreatitis with some mildly elevated lipase compared to baseline. Patient has previously presented with many episodes of this and was treated with pain cocktail that has worked in the past of Dilaudid 0.93m IV q4h, phenergan 12.536mq6h, and ativan 68m57m6h. Initially started off with 6 scheduled doses, but patient required an extension of 6 further doses. Upon completion patient restarted on home meds Oxy 5mg28m and Ativan 68mg 36m. Patient had significant improvement in symptoms prior to discharge and was able to tolerate oral intake during the entire hospitalization.   HTN with associated diabetes Blood pressures elevated upon admission, likely in the setting of decreased oral intake as well as significant pain. Measurements improved with pain management as detailed above. Home medications of hydralazine, diltiazem and amiodarone were continued. Patient Coreg was discontinued at discharge secondary to bradycardia into the 40's. She will need outpatient follow up to assure resolution of bradycardia and no further  antihypertensives are needed.   Type 2 diabetes Given decreased intake, home basal insulin was restarted at a lower dose of 10 Units upon admission, and was also placed on SSI. Patient had one episode of hypoglycemia to 40 even with good PO intake per nursing, increased to 60s with juice. Subsequently decreased Semglee to 8U. Patient's subsequent sugars were stable and was recommended for patient to continue this dose upon discharge with close CBG monitoring and follow-up in clinic.   Atopic Neurodermatitis with Prurigo Nodularis Diagnosed by dermatology 02/2022. Patient is on Dupixent injection q14days at home. Patient was continued on Bactroban and given hydrocortisone cream due to significant itching. Patient also received Seroquel 50mg 57mhere is some possible tactile stimulation patient feels that has previously been improved with this medication. Patient symptoms stable during hospitalization.  Trapezius Muscle Strain Patient with exquisite tender left sided trapezius muscle. On examination the area is ropey and taut. Discontinued tizanidine and started robaxin which patient found to improve her pain. Recommended continued stretching as well as heating pad and lidocaine patches.   Issues for follow-up: Ensure improved BP control after discharge, was elevated in the setting of pain Monitor CBGs, patient's insulin was decreased to Semglee 8U upon discharge. May need to further titrate outpatient.  Resolution of bradycardia and if needed, add back carvedilol Improvement in left trapezius muscle pain  Discharge Diagnoses/Problem List:  Principal Problem for Admission: Cyclic vomiting syndrome in the setting of chronic pancreatitis Other Problems addressed during stay:  T2DM HTN Neurodermatitis   Disposition: Home  Discharge Condition: Stable  Significant Procedures: None  Significant Labs and Imaging:  Recent Labs  Lab 09/03/22 0906  WBC 4.9  HGB 9.9*  HCT 30.2*  PLT 198    Recent Labs  Lab 09/03/22 0906  NA 137  K 4.6  CL 108  CO2 21*  GLUCOSE 164*  BUN 40*  CREATININE 3.03*  CALCIUM 9.1   US Aorta  Result Date: 08/31/2022 CLINICAL DATA:  Abdomen pain EXAM: ULTRASOUND OF ABDOMINAL AORTA TECHNIQUE: Ultrasound examination of the abdominal aorta and proximal common iliac arteries was performed to evaluate for aneurysm. Additional color and Doppler images of the distal aorta were obtained to document patency. COMPARISON:  CT 08/31/2022 FINDINGS: Abdominal aortic measurements as follows: Proximal:  2.180 x 2 transverse cm Mid:  1.9 AP by 1.7 transverse cm Distal:  1.6 AP by 1.6 transverse cm Patent: Yes, peak systolic velocity is 601.0 cm/s Right common iliac artery: 0.7 cm cm Left common iliac artery: 1 cm cm IMPRESSION: Negative for aortic aneurysm. Grossly patent aorta. Limited visualization of the iliac vessels due to bowel gas. Electronically Signed   By: Donavan Foil M.D.   On: 08/31/2022 23:21   CT ABDOMEN PELVIS WO CONTRAST  Result Date: 08/31/2022 CLINICAL DATA:  Abdominal pain, acute, nonlocalized EXAM: CT ABDOMEN AND PELVIS WITHOUT CONTRAST TECHNIQUE: Multidetector CT imaging of the abdomen and pelvis was performed following the standard protocol without IV contrast. RADIATION DOSE REDUCTION: This exam was performed according to the departmental dose-optimization program which includes automated exposure control, adjustment of the mA and/or kV according to patient size and/or use of iterative reconstruction technique. COMPARISON:  06/11/2021 FINDINGS: Lower chest: Lung bases are clear. Hepatobiliary: No evidence of focal hepatic abnormality on this unenhanced exam. Clips in the gallbladder fossa postcholecystectomy. No biliary dilatation. Pancreas: No ductal dilatation or inflammation. Spleen: Normal in size without focal abnormality. Adrenals/Urinary Tract: Normal adrenal glands. Mild prominence of both renal collecting systems and ureters without frank  hydronephrosis. There is symmetric bilateral perinephric edema. No renal calculi. Unremarkable urinary bladder. Stomach/Bowel: Small hiatal hernia. Unremarkable stomach. Equivocal wall thickening about the distal ileum, but no perienteric inflammation. No obstruction. Moderate volume of stool in the colon. The appendix is normal. Vascular/Lymphatic: Moderate aortic atherosclerosis. No aneurysm. No adenopathy. Reproductive: Status post hysterectomy. No adnexal masses. Other: Postsurgical change of the anterior abdominal wall. No free air, free fluid, or intra-abdominal fluid collection. Musculoskeletal: Minimal L4-L5 disc bulge. There are no acute or suspicious osseous abnormalities. IMPRESSION: 1. Equivocal wall thickening about the distal ileum, possible enteritis. No significant phlegm a tori change. 2. Mild prominence of both renal collecting systems and ureters without frank hydronephrosis, possibly chronic, however recommend correlation with urinalysis. 3. Small hiatal hernia. Aortic Atherosclerosis (ICD10-I70.0). Electronically Signed   By: Keith Rake M.D.   On: 08/31/2022 21:54     Results/Tests Pending at Time of Discharge: None  Discharge Medications:  Allergies as of 09/04/2022       Reactions   Bee Venom Anaphylaxis   Required hospital visit to ER   Tegaderm Ag Mesh [silver] Dermatitis   And Sorbaview tape- causes blisters and itching   Acetaminophen Nausea And Vomiting   Novolog [insulin Aspart] Other (See Comments)   Muscles in feet cramp   Semaglutide Nausea And Vomiting   Statins Other (See Comments)   GI intolerance (N/V)   Amlodipine Other (See Comments)   Leg edema   Clonidine Derivatives Other (See Comments)   Sedation at 0.1 mg tablet dose   Flexeril [cyclobenzaprine] Nausea And Vomiting   Lyrica [pregabalin] Other (See Comments)   Leg cramping   Nsaids Nausea And Vomiting   Ozempic (0.25 Or 0.5 Mg-dose) [semaglutide(0.25 Or 0.41m-dos)]    Tape Other (See Comments)  Blisters   Ceftriaxone Other (See Comments)   Caused an ulcer in her mouth   Erythromycin Nausea And Vomiting        Medication List     STOP taking these medications    carvedilol 12.5 MG tablet Commonly known as: COREG   tiZANidine 2 MG tablet Commonly known as: ZANAFLEX       TAKE these medications    Amitiza 24 MCG capsule Generic drug: lubiprostone Take 24 mcg by mouth daily as needed for constipation.   diltiazem 180 MG 24 hr capsule Commonly known as: CARDIZEM CD Take 1 capsule (180 mg total) by mouth in the morning and at bedtime.   Dupixent 300 MG/2ML Sopn Generic drug: Dupilumab Inject 300 mg into the skin every 14 (fourteen) days. Starting at day 15 for maintenance.   fluticasone 50 MCG/ACT nasal spray Commonly known as: FLONASE SHAKE LIQUID AND USE 2 SPRAYS IN EACH NOSTRIL DAILY What changed: See the new instructions.   hydrALAZINE 10 MG tablet Commonly known as: APRESOLINE Take 10 mg by mouth 3 (three) times daily.   Lantus SoloStar 100 UNIT/ML Solostar Pen Generic drug: insulin glargine Inject 8 Units into the skin at bedtime. What changed: See the new instructions.   lidocaine 5 % Commonly known as: LIDODERM Place 1 patch onto the skin daily. Remove & Discard patch within 12 hours or as directed by MD Start taking on: September 05, 2022   linagliptin 5 MG Tabs tablet Commonly known as: TRADJENTA Take 1 tablet (5 mg total) by mouth daily.   LORazepam 1 MG tablet Commonly known as: ATIVAN Take 1 tablet (1 mg total) by mouth 2 (two) times daily as needed for anxiety.   methocarbamol 750 MG tablet Commonly known as: ROBAXIN Take 1 tablet (750 mg total) by mouth 3 (three) times daily for 10 days.   mirtazapine 7.5 MG tablet Commonly known as: REMERON TAKE 1 TABLET(7.5 MG) BY MOUTH AT BEDTIME What changed: See the new instructions.   mupirocin cream 2 % Commonly known as: BACTROBAN Apply 1 application. topically 2 (two) times  daily. What changed:  when to take this reasons to take this   NovoFine Plus Pen Needle 32G X 4 MM Misc Generic drug: Insulin Pen Needle [place] injection twice per day.   B-D UF III MINI PEN NEEDLES 31G X 5 MM Misc Generic drug: Insulin Pen Needle USE UNDER THE SKIN TWICE DAILY AS DIRECTED   onetouch ultrasoft lancets USE TO CHECK BLOOD SUGAR UP TO FOUR TIMES DAILY   OneTouch Verio Reflect w/Device Kit 1 Device by Does not apply route 4 (four) times daily -  before meals and at bedtime.   OneTouch Verio test strip Generic drug: glucose blood Check blood sugar three times a day   oxyCODONE 5 MG immediate release tablet Commonly known as: Oxy IR/ROXICODONE Take 1 tablet (5 mg total) by mouth every 4 (four) hours as needed for severe pain.   promethazine 25 MG suppository Commonly known as: Phenergan Place 1 suppository (25 mg total) rectally every 6 (six) hours as needed for nausea or vomiting.   promethazine 25 MG tablet Commonly known as: PHENERGAN Take 1 tablet (25 mg total) by mouth every 6 (six) hours as needed for nausea or vomiting.   QUEtiapine 50 MG tablet Commonly known as: SEROQUEL TAKE 1 TABLET(50 MG) BY MOUTH AT BEDTIME What changed: See the new instructions.               Discharge Care  Instructions  (From admission, onward)           Start     Ordered   09/04/22 0000  Leave dressing on - Keep it clean, dry, and intact until clinic visit        09/04/22 1507            Discharge Instructions: Please refer to Patient Instructions section of EMR for full details.  Patient was counseled important signs and symptoms that should prompt return to medical care, changes in medications, dietary instructions, activity restrictions, and follow up appointments.   Follow-Up Appointments:   Follow-up Information     McDiarmid, Blane Ohara, MD Follow up on 09/09/2022.   Specialty: Family Medicine Why: Appointment at 9:30AM. Contact information: [place] [address] [phone]                 Riesa Pope, MD 09/04/2022, 3:12 PM

## 2022-09-03 NOTE — Care Management Important Message (Signed)
Important Message  Patient Details  Name: [name] [name] MRN: [id number] Date of Birth: September 22, 1966   Medicare Important Message Given:  Yes     Orbie Pyo 09/03/2022, 4:22 PM

## 2022-09-03 NOTE — Progress Notes (Signed)
Hypoglycemic Event  CBG: 40  Treatment: 4 oz juice/soda,   Symptoms: None  Follow-up CBG: Time:1145 CBG Result: 69  Possible Reasons for Event: Unknown  Treatment: 4 oz juice/soda, meal tray given  Symptoms: None  Follow-up CBG: Time:1200 CBG Result: 63  Treatment: soda,   Symptoms: None  Follow-up CBG: Time:1215 CBG Result: 112   Comments/MD notified: hypoglycemic protocol followed. Dr. Sherrye Payor, notified me told him pt has her meal tray and is eating.     Krystofer Hevener S Vanassa Penniman

## 2022-09-03 NOTE — Discharge Instructions (Addendum)
Dear [name],   Thank you so much for allowing [place] to be part of your care!  You were admitted to [facility] for your abdominal pain and constant vomiting. You were treated with a cocktail of pain medications and anti-nausea medications with significant improvement in your symptoms. You were also given some steroid cream for your rash while you were here.    POST-HOSPITAL & CARE INSTRUCTIONS Ensure that you follow-up with your dermatologist regarding treatment of your skin condition Please let PCP/Specialists know of any changes that were made.  Please see medications section of this packet for any medication changes.  Please do not take tizanidine and robaxin together We are decreasing your long acting insulin, please only take 8 units We have also discontinued your carvedilol until you follow up with your primary care doctor.   DOCTOR'S APPOINTMENT & FOLLOW UP CARE INSTRUCTIONS  Future Appointments  Date Time Provider [place]  09/09/2022  9:30 AM McDiarmid, Blane Ohara, MD [facility]  09/29/2022 11:00 AM Lorretta Harp, MD [facility] None    RETURN PRECAUTIONS: - If continued/persistent nausea and vomiting  Take care and be well!  [facility]  [place], [place] [address] [phone]

## 2022-09-03 NOTE — Progress Notes (Signed)
Inpatient Diabetes Program Recommendations  AACE/ADA: New Consensus Statement on Inpatient Glycemic Control (2015)  Target Ranges:  Prepandial:   less than 140 mg/dL      Peak postprandial:   less than 180 mg/dL (1-2 hours)      Critically ill patients:  140 - 180 mg/dL   Lab Results  Component Value Date   GLUCAP 112 (H) 09/03/2022   HGBA1C 8.6 (A) 07/22/2022    Review of Glycemic Control  Latest Reference Range & Units 09/02/22 22:07 09/03/22 07:20 09/03/22 11:30 09/03/22 11:46  Glucose-Capillary 70 - 99 mg/dL 135 (H) 154 (H) 40 (LL) 66 (L)   Diabetes history: DM 2 Outpatient Diabetes medications:  Lantus 20 units daily, Tradjenta 5 mg daily Current orders for Inpatient glycemic control:  Novolog 0-9 units tid with meals Semglee 8 units q HS  Inpatient Diabetes Program Recommendations:    Please consider reducing Novolog correction to "very sensitive"- 0-6 units tid with meals.   Thanks,  Adah Perl, RN, BC-ADM Inpatient Diabetes Coordinator Pager [phone]  (8a-5p)

## 2022-09-03 NOTE — Hospital Course (Addendum)
[name] [name] is a 56 y.o. female that presented with persistent nausea and vomiting with significant epigastric pain. Patient treated as acute on chronic pancreatitis and for flare of cyclic vomiting syndrome. PMH significant for CKD stage IV with proteinuria, delayed gastric emptying, L7JP, HLD, HTN, cyclic vomiting syndrome, chronic pain.   Cyclic vomiting syndrome Patient presented with acute flare of chronic issue cyclic vomiting in the setting of chronic pancreatitis with some mildly elevated lipase compared to baseline. Patient has previously presented with many episodes of this and was treated with pain cocktail that has worked in the past of Dilaudid 0.'5mg'$  IV q4h, phenergan 12.'5mg'$  q6h, and ativan '1mg'$  q6h. Initially started off with 6 scheduled doses, but patient required an extension of 6 further doses. Upon completion patient restarted on home meds Oxy '5mg'$  q6 and Ativan '1mg'$  BID. Patient had significant improvement in symptoms prior to discharge and was able to tolerate oral intake during the entire hospitalization.   HTN with associated diabetes Blood pressures elevated upon admission, likely in the setting of decreased oral intake as well as significant pain. Measurements improved with pain management as detailed above. Home medications of hydralazine, diltiazem and amiodarone were continued. Patient Coreg was discontinued at discharge secondary to bradycardia into the 40's. She will need outpatient follow up to assure resolution of bradycardia and no further antihypertensives are needed.   Type 2 diabetes Given decreased intake, home basal insulin was restarted at a lower dose of 10 Units upon admission, and was also placed on SSI. Patient had one episode of hypoglycemia to 40 even with good PO intake per nursing, increased to 60s with juice. Subsequently decreased Semglee to 8U. Patient's subsequent sugars were stable and was recommended for patient to continue this dose upon discharge with  close CBG monitoring and follow-up in clinic.   Atopic Neurodermatitis with Prurigo Nodularis Diagnosed by dermatology 02/2022. Patient is on Dupixent injection q14days at home. Patient was continued on Bactroban and given hydrocortisone cream due to significant itching. Patient also received Seroquel '50mg'$  as there is some possible tactile stimulation patient feels that has previously been improved with this medication. Patient symptoms stable during hospitalization.  Trapezius Muscle Strain Patient with exquisite tender left sided trapezius muscle. On examination the area is ropey and taut. Discontinued tizanidine and started robaxin which patient found to improve her pain. Recommended continued stretching as well as heating pad and lidocaine patches.   Issues for follow-up: Ensure improved BP control after discharge, was elevated in the setting of pain Monitor CBGs, patient's insulin was decreased to Semglee 8U upon discharge. May need to further titrate outpatient.  Resolution of bradycardia and if needed, add back carvedilol Improvement in left trapezius muscle pain

## 2022-09-03 NOTE — Progress Notes (Signed)
Pt had glucose of 40 at 11:30 today. Called nurse Candice, pt is currently eating and not feeling symptomatic. Discussed with nurse to reach out to team if she is feeling symptomatically hypoglycemic.   A/P Hypoglycemic to 40, but eatting and feeling normal.  - CTM for hypoglycemia

## 2022-09-04 DIAGNOSIS — G8929 Other chronic pain: Secondary | ICD-10-CM

## 2022-09-04 DIAGNOSIS — M62838 Other muscle spasm: Secondary | ICD-10-CM | POA: Diagnosis present

## 2022-09-04 DIAGNOSIS — M545 Low back pain, unspecified: Secondary | ICD-10-CM

## 2022-09-04 DIAGNOSIS — E118 Type 2 diabetes mellitus with unspecified complications: Secondary | ICD-10-CM

## 2022-09-04 DIAGNOSIS — I10 Essential (primary) hypertension: Secondary | ICD-10-CM

## 2022-09-04 DIAGNOSIS — F17210 Nicotine dependence, cigarettes, uncomplicated: Secondary | ICD-10-CM

## 2022-09-04 LAB — GLUCOSE, CAPILLARY
Glucose-Capillary: 128 mg/dL — ABNORMAL HIGH (ref 70–99)
Glucose-Capillary: 71 mg/dL (ref 70–99)

## 2022-09-04 MED ORDER — METHOCARBAMOL 500 MG PO TABS
750.0000 mg | ORAL_TABLET | Freq: Three times a day (TID) | ORAL | Status: DC
Start: 2022-09-04 — End: 2022-09-04
  Administered 2022-09-04 (×2): 750 mg via ORAL
  Filled 2022-09-04 (×2): qty 2

## 2022-09-04 MED ORDER — INSULIN ASPART 100 UNIT/ML IJ SOLN: 0.0000 [IU] | Freq: Three times a day (TID) | INTRAMUSCULAR | Status: DC

## 2022-09-04 MED ORDER — LIDOCAINE 5 % EX PTCH: 1.0000 | MEDICATED_PATCH | CUTANEOUS | 0 refills | Status: DC

## 2022-09-04 MED ORDER — METHOCARBAMOL 750 MG PO TABS
750.0000 mg | ORAL_TABLET | Freq: Three times a day (TID) | ORAL | 0 refills | Status: AC
Start: 2022-09-04 — End: 2022-09-14

## 2022-09-04 MED ORDER — LANTUS SOLOSTAR 100 UNIT/ML ~~LOC~~ SOPN: 8.0000 [IU] | PEN_INJECTOR | Freq: Every day | SUBCUTANEOUS | 0 refills | Status: DC

## 2022-09-04 NOTE — Progress Notes (Signed)
Nsg Discharge Note  Admit Date:  08/31/2022 Discharge date: 09/04/2022   [name] [name] to be D/C'd Home per MD order.  AVS completed.  Patient/caregiver able to verbalize understanding.  Discharge Medication: Allergies as of 09/04/2022       Reactions   Bee Venom Anaphylaxis   Required hospital visit to ER   Tegaderm Ag Mesh [silver] Dermatitis   And Sorbaview tape- causes blisters and itching   Acetaminophen Nausea And Vomiting   Novolog [insulin Aspart] Other (See Comments)   Muscles in feet cramp   Semaglutide Nausea And Vomiting   Statins Other (See Comments)   GI intolerance (N/V)   Amlodipine Other (See Comments)   Leg edema   Clonidine Derivatives Other (See Comments)   Sedation at 0.1 mg tablet dose   Flexeril [cyclobenzaprine] Nausea And Vomiting   Lyrica [pregabalin] Other (See Comments)   Leg cramping   Nsaids Nausea And Vomiting   Ozempic (0.25 Or 0.5 Mg-dose) [semaglutide(0.25 Or 0.46m-dos)]    Tape Other (See Comments)   Blisters   Ceftriaxone Other (See Comments)   Caused an ulcer in her mouth   Erythromycin Nausea And Vomiting        Medication List     STOP taking these medications    carvedilol 12.5 MG tablet Commonly known as: COREG   tiZANidine 2 MG tablet Commonly known as: ZANAFLEX       TAKE these medications    Amitiza 24 MCG capsule Generic drug: lubiprostone Take 24 mcg by mouth daily as needed for constipation.   diltiazem 180 MG 24 hr capsule Commonly known as: CARDIZEM CD Take 1 capsule (180 mg total) by mouth in the morning and at bedtime.   Dupixent 300 MG/2ML Sopn Generic drug: Dupilumab Inject 300 mg into the skin every 14 (fourteen) days. Starting at day 15 for maintenance.   fluticasone 50 MCG/ACT nasal spray Commonly known as: FLONASE SHAKE LIQUID AND USE 2 SPRAYS IN EACH NOSTRIL DAILY What changed: See the new instructions.   hydrALAZINE 10 MG tablet Commonly known as: APRESOLINE Take 10 mg by mouth 3 (three)  times daily.   Lantus SoloStar 100 UNIT/ML Solostar Pen Generic drug: insulin glargine Inject 8 Units into the skin at bedtime. What changed: See the new instructions.   lidocaine 5 % Commonly known as: LIDODERM Place 1 patch onto the skin daily. Remove & Discard patch within 12 hours or as directed by MD Start taking on: September 05, 2022   linagliptin 5 MG Tabs tablet Commonly known as: TRADJENTA Take 1 tablet (5 mg total) by mouth daily.   LORazepam 1 MG tablet Commonly known as: ATIVAN Take 1 tablet (1 mg total) by mouth 2 (two) times daily as needed for anxiety.   methocarbamol 750 MG tablet Commonly known as: ROBAXIN Take 1 tablet (750 mg total) by mouth 3 (three) times daily for 10 days.   mirtazapine 7.5 MG tablet Commonly known as: REMERON TAKE 1 TABLET(7.5 MG) BY MOUTH AT BEDTIME What changed: See the new instructions.   mupirocin cream 2 % Commonly known as: BACTROBAN Apply 1 application. topically 2 (two) times daily. What changed:  when to take this reasons to take this   NovoFine Plus Pen Needle 32G X 4 MM Misc Generic drug: Insulin Pen Needle [place] injection twice per day.   B-D UF III MINI PEN NEEDLES 31G X 5 MM Misc Generic drug: Insulin Pen Needle USE UNDER THE SKIN TWICE DAILY AS DIRECTED   onetouch  ultrasoft lancets USE TO CHECK BLOOD SUGAR UP TO FOUR TIMES DAILY   OneTouch Verio Reflect w/Device Kit 1 Device by Does not apply route 4 (four) times daily -  before meals and at bedtime.   OneTouch Verio test strip Generic drug: glucose blood Check blood sugar three times a day   oxyCODONE 5 MG immediate release tablet Commonly known as: Oxy IR/ROXICODONE Take 1 tablet (5 mg total) by mouth every 4 (four) hours as needed for severe pain.   promethazine 25 MG suppository Commonly known as: Phenergan Place 1 suppository (25 mg total) rectally every 6 (six) hours as needed for nausea or vomiting.   promethazine 25 MG tablet Commonly known as:  PHENERGAN Take 1 tablet (25 mg total) by mouth every 6 (six) hours as needed for nausea or vomiting.   QUEtiapine 50 MG tablet Commonly known as: SEROQUEL TAKE 1 TABLET(50 MG) BY MOUTH AT BEDTIME What changed: See the new instructions.               Discharge Care Instructions  (From admission, onward)           Start     Ordered   09/04/22 0000  Leave dressing on - Keep it clean, dry, and intact until clinic visit        09/04/22 1507            Discharge Assessment: Vitals:   09/04/22 0457 09/04/22 0900  BP: (!) 155/53 (!) 148/57  Pulse: (!) 47 (!) 55  Resp: 17 16  Temp: 98.3 F (36.8 C) 98 F (36.7 C)  SpO2: 100% 100%   Skin clean, dry and intact without evidence of skin break down, no evidence of skin tears noted. IV catheter discontinued intact. Site without signs and symptoms of complications - no redness or edema noted at insertion site, patient denies c/o pain - only slight tenderness at site.  Dressing with slight pressure applied.  D/c Instructions-Education: Discharge instructions given to patient/family with verbalized understanding. D/c education completed with patient/family including follow up instructions, medication list, d/c activities limitations if indicated, with other d/c instructions as indicated by MD - patient able to verbalize understanding, all questions fully answered. Patient instructed to return to ED, call 911, or call MD for any changes in condition.  Patient escorted via [place], and D/C home via private auto.  Atilano Ina, RN 09/04/2022 3:48 PM

## 2022-09-04 NOTE — Progress Notes (Signed)
Daily Progress Note Intern Pager: [phone]  Patient name: [name] [name] Medical record number: [id number] Date of birth: 07-Jun-1966 Age: 56 y.o. Gender: female  Primary Care Provider: McDiarmid, Blane Ohara, MD Consultants: None Code Status: Full code  Pt Overview and Major Events to Date:  9/6: Admitted to FMTS  Assessment and Plan: 56 yo female with PMHx chronic pancreatitis and cyclic vomiting syndrome presents for flare of cyclic vomiting syndrome and possible acute on chronic pancreatitis.  Cyclic vomiting syndrome Epigastric pain has improved denies any continued nausea and vomiting. -Continue home oxycodone 5 mg every 6 as needed -Continue home Ativan 1 mg twice daily as needed -As needed Zofran  Hypertension Current regimen of hydralazine 10 mg 3 times daily, diltiazem 180 mg twice daily and Coreg 6.25 twice daily.  She is remained bradycardic, current parameters were to give unless pulse under 30.  We will change these parameters and hold diltiazem this morning as carvedilol was already given. -Continue hydralazine 10 mg 3 times daily, hold diltiazem and Coreg today -Restart AV nodal blocking agents based on heart rate, would recommend only giving if heart rate greater than 60 bpm  Left-sided shoulder/neck pain Patient has taut musculature over her left trapezius.  This has been present for the past months and improves with analgesics, her oxycodone does not really help.  Likely MSK injury to the area.  We will try Robaxin she has had difficulty with other muscle relaxants in the past secondary to sedation.  We will also add a heating pad and lidocaine patch.  Can consider also Voltaren gel as has limited systemic effects. -Robaxin-750 milligrams 3 times daily, heating pad, lidocaine patch -If persistent can consider outpatient PT  Type 2 diabetes Had an episode of hypoglycemia yesterday,.  She is sensitive to her SSI, will decrease to very sensitive and continue Semglee  8 units nightly.  FEN/GI: Normal diet PPx: Lovenox '30mg'$  Dispo: Home pending improvement this afternoon  Subjective:  Patient assessed at bedside today, she is resting comfortably.  She continues to have left shoulder and neck pain.  She notes she has had difficulty with Flexeril and other muscle relaxers in the past as they have made her increasingly somnolent.  She denies trying Robaxin previously.  We discussed that if she does have improvement and feels comfortable we will plan to discharge home today.  She denies any recurrent nausea or vomiting is tolerating p.o. diet well.  Objective: Temp:  [98 F (36.7 C)-98.8 F (37.1 C)] 98 F (36.7 C) (09/09 0900) Pulse Rate:  [47-55] 55 (09/09 0900) Resp:  [16-19] 16 (09/09 0900) BP: (148-171)/(53-63) 148/57 (09/09 0900) SpO2:  [97 %-100 %] 100 % (09/09 0900) Physical Exam: General: No acute distress Cardio: RRR without murmur, rub or gallop Respiratory: CTAB without wheezing/rhonchi/rales Abdomen: Soft, non-distended MSK: Taut musculature over left trapezius, tender to touch   Laboratory: Most recent CBC Lab Results  Component Value Date   WBC 4.9 09/03/2022   HGB 9.9 (L) 09/03/2022   HCT 30.2 (L) 09/03/2022   MCV 93.5 09/03/2022   PLT 198 09/03/2022   Most recent BMP    Latest Ref Rng & Units 09/03/2022    9:06 AM  BMP  Glucose 70 - 99 mg/dL 164   BUN 6 - 20 mg/dL 40   Creatinine 0.44 - 1.00 mg/dL 3.03   Sodium 135 - 145 mmol/L 137   Potassium 3.5 - 5.1 mmol/L 4.6   Chloride 98 - 111 mmol/L 108  CO2 22 - 32 mmol/L 21   Calcium 8.9 - 10.3 mg/dL 9.1     [place]  Internal Medicine Resident PGY-3 [place]  Pager: [phone]

## 2022-09-06 ENCOUNTER — Other Ambulatory Visit: Payer: Self-pay

## 2022-09-06 DIAGNOSIS — L281 Prurigo nodularis: Secondary | ICD-10-CM

## 2022-09-06 MED ORDER — DUPIXENT 300 MG/2ML ~~LOC~~ SOAJ: 300.0000 mg | SUBCUTANEOUS | 5 refills | Status: DC

## 2022-09-06 NOTE — Progress Notes (Signed)
Refill request faxed from [facility]. Escripted  

## 2022-09-07 ENCOUNTER — Other Ambulatory Visit: Payer: Self-pay | Admitting: Family Medicine

## 2022-09-07 MED ORDER — ROSUVASTATIN CALCIUM 5 MG PO TABS: 5.0000 mg | ORAL_TABLET | Freq: Every day | ORAL | 3 refills | Status: DC

## 2022-09-07 NOTE — Progress Notes (Signed)
Sent in Prescription rosuvastatin 5 mg daily for primary prevention in moderate CVD risk

## 2022-09-09 ENCOUNTER — Encounter: Payer: Self-pay | Admitting: Family Medicine

## 2022-09-09 ENCOUNTER — Ambulatory Visit (INDEPENDENT_AMBULATORY_CARE_PROVIDER_SITE_OTHER): Payer: 59 | Admitting: Family Medicine

## 2022-09-09 VITALS — BP 150/59 | Ht 61.0 in | Wt 158.1 lb

## 2022-09-09 DIAGNOSIS — H04123 Dry eye syndrome of bilateral lacrimal glands: Secondary | ICD-10-CM

## 2022-09-09 DIAGNOSIS — K3184 Gastroparesis: Secondary | ICD-10-CM

## 2022-09-09 DIAGNOSIS — H5789 Other specified disorders of eye and adnexa: Secondary | ICD-10-CM | POA: Diagnosis not present

## 2022-09-09 DIAGNOSIS — E1143 Type 2 diabetes mellitus with diabetic autonomic (poly)neuropathy: Secondary | ICD-10-CM

## 2022-09-09 DIAGNOSIS — E785 Hyperlipidemia, unspecified: Secondary | ICD-10-CM

## 2022-09-09 DIAGNOSIS — E1169 Type 2 diabetes mellitus with other specified complication: Secondary | ICD-10-CM | POA: Diagnosis not present

## 2022-09-09 DIAGNOSIS — R1115 Cyclical vomiting syndrome unrelated to migraine: Secondary | ICD-10-CM

## 2022-09-09 MED ORDER — SULFACETAMIDE SODIUM 10 % OP SOLN: 1.0000 [drp] | OPHTHALMIC | 0 refills | Status: AC

## 2022-09-09 MED ORDER — CARBOXYMETHYLCELLULOSE SODIUM 1 % OP SOLN: 1.0000 [drp] | Freq: Three times a day (TID) | OPHTHALMIC | 12 refills | Status: DC

## 2022-09-09 MED ORDER — OXYCODONE HCL 5 MG PO TABS: 5.0000 mg | ORAL_TABLET | ORAL | 0 refills | Status: DC | PRN

## 2022-09-09 MED ORDER — NEOMYCIN-POLYMYXIN-HC 3.5-10000-1 OP SUSP: 3.0000 [drp] | Freq: Four times a day (QID) | OPHTHALMIC | 0 refills | Status: DC

## 2022-09-09 NOTE — Patient Instructions (Addendum)
You appear to have eye irritation in your left eye.  Start sulfacetamide antibiotic eye drops, one drop in left eye every 4 hours while awake for 5 days.  Refresh eye moisturizer drop use as needed

## 2022-09-09 NOTE — Progress Notes (Unsigned)
[name] [name] is alone Sources of clinical information for visit is/are patient. Nursing assessment for this office visit was reviewed with the patient for accuracy and revision.     Previous Report(s) Reviewed: recent DC summary     09/09/2022    9:53 AM  Depression screen PHQ 2/9  Decreased Interest 0  Down, Depressed, Hopeless 0  PHQ - 2 Score 0  Altered sleeping 0  Tired, decreased energy 0  Change in appetite 0  Feeling bad or failure about yourself  0  Trouble concentrating 0  Moving slowly or fidgety/restless 0  Suicidal thoughts 0  PHQ-9 Score 0   Flowsheet Row Office Visit from 09/09/2022 in [place] Office Visit from 04/15/2022 in [place] Office Visit from 03/25/2022 in [place]  Thoughts that you would be better off dead, or of hurting yourself in some way Not at all Not at all Not at all  PHQ-9 Total Score 0 9 --          09/16/2021    3:01 PM 06/05/2020   10:31 AM 11/16/2018    2:50 PM 07/28/2018    8:51 AM 01/05/2018    8:59 AM  [place] in the past year? 0 1 0 No No  Number falls in past yr: 0 0 0    Injury with Fall? 0 1 0         09/09/2022    9:53 AM 04/15/2022    2:57 PM 03/25/2022    9:43 AM  PHQ9 SCORE ONLY  PHQ-9 Total Score 0 9 3    Adult vaccines due  Topic Date Due   TETANUS/TDAP  09/29/2023    Health Maintenance Due  Topic Date Due   Zoster Vaccines- Shingrix (1 of 2) Never done   Diabetic kidney evaluation - Urine ACR  03/04/2018   FOOT EXAM  07/10/2021      History/P.E. limitations: none  Adult vaccines due  Topic Date Due   TETANUS/TDAP  09/29/2023    Diabetes Health Maintenance Due  Topic Date Due   FOOT EXAM  07/10/2021   HEMOGLOBIN A1C  01/22/2023   OPHTHALMOLOGY EXAM  02/23/2023    Health Maintenance Due  Topic Date Due   Zoster Vaccines- Shingrix (1 of 2) Never done   Diabetic kidney evaluation - Urine ACR  03/04/2018   FOOT EXAM   07/10/2021     Chief Complaint  Patient presents with   Eye Pain    Left eye    Patient arrived late for visit.  She chose to just address her red eye. --------------------------------------------------------------------------------------------------------------------------------------------- Visit Problem List with A/P  Diabetic gastroparesis associated with type 2 diabetes mellitus ([place]) Established problem that has improved and has meet goal of adequate symptom control.  Continue rescue regiemnt of oxycodone and lorazepam and phenergan   Hyperlipidemia associated with type 2 diabetes mellitus ([place]) Uncontrolled established problem Start Crestor 5 mg daily

## 2022-09-10 ENCOUNTER — Encounter: Payer: Self-pay | Admitting: Family Medicine

## 2022-09-10 DIAGNOSIS — H04123 Dry eye syndrome of bilateral lacrimal glands: Secondary | ICD-10-CM | POA: Insufficient documentation

## 2022-09-10 DIAGNOSIS — H5789 Other specified disorders of eye and adnexa: Secondary | ICD-10-CM | POA: Insufficient documentation

## 2022-09-10 HISTORY — DX: Dry eye syndrome of bilateral lacrimal glands: H04.123

## 2022-09-10 NOTE — Assessment & Plan Note (Signed)
Established problem that has improved and has meet goal of adequate symptom control.  Continue rescue regiemnt of oxycodone and lorazepam and phenergan

## 2022-09-10 NOTE — Assessment & Plan Note (Signed)
New problem Onset yesterday Itching, clear watery drainage Not painful history traumatic glaucoma left eye patient to see Opthalmology to follow u glaucoma tomorrow   Left eye Conjunctival injection with limbic sparing, inverted lid without obvious foreign matter Cornea sharp light reflex x 4 quadrants PERRL No eyelid edema or erythema No preauricular node  A/ Unilateral conjunctivits - Will give 5 day Bleph-10 drops and Refresh drops patient following up with her ophthalmologist on 09/10/22

## 2022-09-10 NOTE — Assessment & Plan Note (Signed)
Uncontrolled established problem Start Crestor 5 mg daily

## 2022-09-29 ENCOUNTER — Ambulatory Visit: Payer: 59 | Admitting: Cardiovascular Disease

## 2022-10-01 ENCOUNTER — Other Ambulatory Visit: Payer: Self-pay | Admitting: Student

## 2022-10-01 DIAGNOSIS — E1143 Type 2 diabetes mellitus with diabetic autonomic (poly)neuropathy: Secondary | ICD-10-CM

## 2022-10-01 DIAGNOSIS — R1115 Cyclical vomiting syndrome unrelated to migraine: Secondary | ICD-10-CM

## 2022-10-01 DIAGNOSIS — E118 Type 2 diabetes mellitus with unspecified complications: Secondary | ICD-10-CM

## 2022-10-01 DIAGNOSIS — E1142 Type 2 diabetes mellitus with diabetic polyneuropathy: Secondary | ICD-10-CM

## 2022-10-08 ENCOUNTER — Other Ambulatory Visit: Payer: Self-pay

## 2022-10-08 DIAGNOSIS — E1143 Type 2 diabetes mellitus with diabetic autonomic (poly)neuropathy: Secondary | ICD-10-CM

## 2022-10-08 DIAGNOSIS — R1115 Cyclical vomiting syndrome unrelated to migraine: Secondary | ICD-10-CM

## 2022-10-08 DIAGNOSIS — Z794 Long term (current) use of insulin: Secondary | ICD-10-CM

## 2022-10-08 DIAGNOSIS — E113293 Type 2 diabetes mellitus with mild nonproliferative diabetic retinopathy without macular edema, bilateral: Secondary | ICD-10-CM

## 2022-10-08 MED ORDER — OXYCODONE HCL 5 MG PO TABS: 5.0000 mg | ORAL_TABLET | ORAL | 0 refills | Status: DC | PRN

## 2022-10-08 MED ORDER — LORAZEPAM 1 MG PO TABS: 1.0000 mg | ORAL_TABLET | Freq: Two times a day (BID) | ORAL | 0 refills | Status: DC | PRN

## 2022-10-08 MED ORDER — ONETOUCH ULTRASOFT LANCETS MISC: 12 refills | Status: DC

## 2022-10-08 MED ORDER — ONETOUCH VERIO VI STRP: ORAL_STRIP | 12 refills | Status: DC

## 2022-10-21 ENCOUNTER — Encounter: Payer: Self-pay | Admitting: Internal Medicine

## 2022-10-25 ENCOUNTER — Ambulatory Visit: Payer: 59 | Attending: Cardiovascular Disease | Admitting: Cardiovascular Disease

## 2022-10-25 ENCOUNTER — Encounter: Payer: Self-pay | Admitting: Cardiovascular Disease

## 2022-10-25 DIAGNOSIS — E1159 Type 2 diabetes mellitus with other circulatory complications: Secondary | ICD-10-CM

## 2022-10-25 DIAGNOSIS — E785 Hyperlipidemia, unspecified: Secondary | ICD-10-CM

## 2022-10-25 DIAGNOSIS — Z720 Tobacco use: Secondary | ICD-10-CM

## 2022-10-25 DIAGNOSIS — E1169 Type 2 diabetes mellitus with other specified complication: Secondary | ICD-10-CM | POA: Diagnosis not present

## 2022-10-25 DIAGNOSIS — I739 Peripheral vascular disease, unspecified: Secondary | ICD-10-CM | POA: Diagnosis not present

## 2022-10-25 DIAGNOSIS — I152 Hypertension secondary to endocrine disorders: Secondary | ICD-10-CM

## 2022-10-25 NOTE — Patient Instructions (Addendum)
Medication Instructions:  Your physician recommends that you continue on your current medications as directed. Please refer to the Current Medication list given to you today.  *If you need a refill on your cardiac medications before your next appointment, please call your pharmacy*   Lab Work: Your physician recommends that you have labs drawn today: Lipid/liver panel  If you have labs (blood work) drawn today and your tests are completely normal, you will receive your results only by: MyChart Message (if you have MyChart) OR A paper copy in the mail If you have any lab test that is abnormal or we need to change your treatment, we will call you to review the results.   Testing/Procedures: Your physician has requested that you have a carotid duplex. This test is an ultrasound of the carotid arteries in your neck. It looks at blood flow through these arteries that supply the brain with blood. Allow one hour for this exam. There are no restrictions or special instructions. This procedure will be done at [place]. [address]  Your physician has requested that you have a lower extremity arterial duplex. This test is an ultrasound of the arteries in the legs. It looks at arterial blood flow in the legs. Allow one hour for Lower Arterial scans. There are no restrictions or special instructions.  This procedure will be done at [place]  Your physician has requested that you have an ankle brachial index (ABI). During this test an ultrasound and blood pressure cuff are used to evaluate the arteries that supply the arms and legs with blood. Allow thirty minutes for this exam. There are no restrictions or special instructions. This procedure will be done at [place]. [address]    Follow-Up: At [facility], you and your health needs are our priority.  As part of our continuing mission to provide you with exceptional heart care, we have created designated  Provider Care Teams.  These Care Teams include your primary Cardiologist (physician) and Advanced Practice Providers (APPs -  Physician Assistants and Nurse Practitioners) who all work together to provide you with the care you need, when you need it.  We recommend signing up for the patient portal called "MyChart".  Sign up information is provided on this After Visit Summary.  MyChart is used to connect with patients for Virtual Visits (Telemedicine).  Patients are able to view lab/test results, encounter notes, upcoming appointments, etc.  Non-urgent messages can be sent to your provider as well.   To learn more about what you can do with MyChart, go to [email or web address].    Your next appointment:   12 month(s)  The format for your next appointment:   In Person  Provider:   Quay Burow, MD

## 2022-10-25 NOTE — Assessment & Plan Note (Signed)
History of hyperlipidemia not on statin therapy although prescribed by her PCP.  We will recheck a lipid liver profile.

## 2022-10-25 NOTE — Assessment & Plan Note (Signed)
Ongoing tobacco abuse of 1/2 pack/day recalcitrant to risk factor modification.

## 2022-10-25 NOTE — Assessment & Plan Note (Signed)
History of essential hypertension blood pressure measured today at 136/64.  She is on diltiazem and hydralazine.

## 2022-10-25 NOTE — Assessment & Plan Note (Signed)
Patient complains of left hip and calf claudication.  We will check lower extremity arterial Doppler studies.

## 2022-10-25 NOTE — Progress Notes (Signed)
10/25/2022 [name] [name]   07/19/1966  [id number]  Primary Physician McDiarmid, Blane Ohara, MD Primary Cardiologist: Lorretta Harp MD Garret Reddish, [place], [place]  HPI:  [name] [name] is a 56 y.o.  mildly overweight single African-American female mother of 4 children, grandmother of 84 grandchildren who is currently disabled because of vomiting. She was initially referred by hospital emergency room because of atypical chest pain.  I last saw her in the office 10/05/2019.  Risk factors include continued tobacco abuse of one third pack per day having smoked approximately 10-15 pack years. She has history of hypertension, diabetes and untreated hyperlipidemia. Her mother did die of myocardial infarction age 13. She had cardiac catheterization performed over 10 years ago that showed normal coronary arteries. She gets occasional atypical chest pain occurring every other month or so which is brief, sharp and nonischemic.   Unfortunately, she lost her youngest son since I last saw her of a gunshot wound.  Since I saw her 3 years ago she was recently hospitalized for nausea and vomiting.  She apparently has chronic pancreatitis.  She still smokes a half a pack a day.  She denies chest pain or shortness of breath.  She did relay symptoms of left hip and calf claudication however.       Current Meds  Medication Sig   diltiazem (CARDIZEM CD) 180 MG 24 hr capsule Take 1 capsule (180 mg total) by mouth in the morning and at bedtime.   insulin glargine (LANTUS SOLOSTAR) 100 UNIT/ML Solostar Pen Inject 8 Units into the skin at bedtime.   Insulin Pen Needle (B-D UF III MINI PEN NEEDLES) 31G X 5 MM MISC USE UNDER THE SKIN TWICE DAILY AS DIRECTED   lidocaine (LIDODERM) 5 % Place 1 patch onto the skin daily. Remove & Discard patch within 12 hours or as directed by MD   linagliptin (TRADJENTA) 5 MG TABS tablet Take 1 tablet (5 mg total) by mouth daily.   LORazepam (ATIVAN) 1 MG tablet Take 1 tablet (1 mg  total) by mouth 2 (two) times daily as needed for anxiety.   mupirocin cream (BACTROBAN) 2 % Apply 1 application. topically 2 (two) times daily. (Patient taking differently: Apply 1 application  topically daily as needed (For skin rash per patient).)   oxyCODONE (OXY IR/ROXICODONE) 5 MG immediate release tablet Take 1 tablet (5 mg total) by mouth every 4 (four) hours as needed for severe pain.   promethazine (PHENERGAN) 25 MG tablet Take 1 tablet (25 mg total) by mouth every 6 (six) hours as needed for nausea or vomiting.   QUEtiapine (SEROQUEL) 50 MG tablet TAKE 1 TABLET(50 MG) BY MOUTH AT BEDTIME (Patient taking differently: Take 50 mg by mouth daily as needed (For sleep per patient).)     Allergies  Allergen Reactions   Bee Venom Anaphylaxis    Required hospital visit to ER   Tegaderm Ag Mesh [Silver] Dermatitis    And [place] tape- causes blisters and itching   Acetaminophen Nausea And Vomiting   Novolog [Insulin Aspart] Other (See Comments)    Muscles in feet cramp   Semaglutide Nausea And Vomiting   Statins Other (See Comments)    GI intolerance (N/V)   Amlodipine Other (See Comments)    Leg edema   Clonidine Derivatives Other (See Comments)    Sedation at 0.1 mg tablet dose   Flexeril [Cyclobenzaprine] Nausea And Vomiting   Lyrica [Pregabalin] Other (See Comments)    Leg cramping  Nsaids Nausea And Vomiting   Ozempic (0.25 Or 0.5 Mg-Dose) [Semaglutide(0.25 Or 0.'5mg'$ -Dos)]    Tape Other (See Comments)    Blisters   Ceftriaxone Other (See Comments)    Caused an ulcer in her mouth   Erythromycin Nausea And Vomiting    Social History   Socioeconomic History   Marital status: Single    Spouse name: Not on file   Number of children: 5   Years of education: 10   Highest education level: Not on file  Occupational History   Occupation: disabled  Tobacco Use   Smoking status: Every Day    Packs/day: 0.50    Years: 22.00    Total pack years: 11.00    Types: Cigarettes     Start date: 12/27/1982   Smokeless tobacco: Never   Tobacco comments:    working on quitting - Down to 2 cigs a day  Vaping Use   Vaping Use: Never used  Substance and Sexual Activity   Alcohol use: Yes    Comment: Rare   Drug use: Yes    Types: Marijuana    Comment: daily   Sexual activity: Yes    Partners: Male    Birth control/protection: None  Other Topics Concern   Not on file  Social History Narrative   Lives alone in [place], [place]. Daughter and 2 grandchildren nearby   Has had 4 children. Living Children are [place]   Has 10 grandchildren   Social Determinants of Health   Financial Resource Strain: Not on file  Food Insecurity: No Food Insecurity (09/01/2022)   Hunger Vital Sign    Worried About Running Out of Food in the Last Year: Never true    [place] in the Last Year: Never true  Transportation Needs: No Transportation Needs (09/01/2022)   PRAPARE - Hydrologist (Medical): No    Lack of Transportation (Non-Medical): No  Physical Activity: Not on file  Stress: Stress Concern Present (09/26/2020)   [place]    Feeling of Stress : Rather much  Social Connections: Not on file  Intimate Partner Violence: Not At Risk (09/01/2022)   Humiliation, Afraid, Rape, and Kick questionnaire    Fear of Current or Ex-Partner: No    Emotionally Abused: No    Physically Abused: No    Sexually Abused: No     Review of Systems: General: negative for chills, fever, night sweats or weight changes.  Cardiovascular: negative for chest pain, dyspnea on exertion, edema, orthopnea, palpitations, paroxysmal nocturnal dyspnea or shortness of breath Dermatological: negative for rash Respiratory: negative for cough or wheezing Urologic: negative for hematuria Abdominal: negative for nausea, vomiting, diarrhea, bright red blood per rectum, melena, or  hematemesis Neurologic: negative for visual changes, syncope, or dizziness All other systems reviewed and are otherwise negative except as noted above.    Blood pressure 136/64, pulse 70, height 5' 1.5" (1.562 m), weight 161 lb 3.2 oz (73.1 kg), SpO2 97 %.  General appearance: alert and no distress Neck: no adenopathy, no JVD, supple, symmetrical, trachea midline, thyroid not enlarged, symmetric, no tenderness/mass/nodules, and soft right carotid bruit Lungs: clear to auscultation bilaterally Heart: regular rate and rhythm, S1, S2 normal, no murmur, click, rub or gallop Extremities: extremities normal, atraumatic, no cyanosis or edema Pulses: 2+ and symmetric Skin: Skin color, texture, turgor normal. No rashes or lesions Neurologic: Grossly normal  EKG not performed today  ASSESSMENT AND PLAN:   Tobacco abuse Ongoing tobacco abuse of 1/2 pack/day recalcitrant to risk factor modification.  Hyperlipidemia associated with type 2 diabetes mellitus ([place]) History of hyperlipidemia not on statin therapy although prescribed by her PCP.  We will recheck a lipid liver profile.  Hypertension associated with diabetes ([place]) History of essential hypertension blood pressure measured today at 136/64.  She is on diltiazem and hydralazine.  Claudication in peripheral vascular disease [facility]) Patient complains of left hip and calf claudication.  We will check lower extremity arterial Doppler studies.     Lorretta Harp MD FACP,FACC,FAHA, [facility] 10/25/2022 1:44 PM

## 2022-10-26 LAB — LIPID PANEL
Chol/HDL Ratio: 3 ratio (ref 0.0–4.4)
Cholesterol, Total: 194 mg/dL (ref 100–199)
HDL: 65 mg/dL (ref >39)
LDL Chol Calc (NIH): 113 mg/dL — ABNORMAL HIGH (ref 0–99)
Triglycerides: 88 mg/dL (ref 0–149)
VLDL Cholesterol Cal: 16 mg/dL (ref 5–40)

## 2022-10-26 LAB — HEPATIC FUNCTION PANEL
ALT: 7 IU/L (ref 0–32)
AST: 17 IU/L (ref 0–40)
Albumin: 4.4 g/dL (ref 3.8–4.9)
Alkaline Phosphatase: 101 IU/L (ref 44–121)
Bilirubin Total: <0.2 mg/dL (ref 0.0–1.2)
Bilirubin, Direct: <0.1 mg/dL (ref 0.00–0.40)
Total Protein: 6.9 g/dL (ref 6.0–8.5)

## 2022-10-28 ENCOUNTER — Encounter: Payer: Self-pay | Admitting: Family Medicine

## 2022-10-28 ENCOUNTER — Ambulatory Visit (INDEPENDENT_AMBULATORY_CARE_PROVIDER_SITE_OTHER): Payer: 59 | Admitting: Family Medicine

## 2022-10-28 ENCOUNTER — Other Ambulatory Visit: Payer: Self-pay | Admitting: Cardiovascular Disease

## 2022-10-28 VITALS — BP 160/86 | HR 82 | Ht 61.0 in | Wt 161.1 lb

## 2022-10-28 DIAGNOSIS — Z794 Long term (current) use of insulin: Secondary | ICD-10-CM

## 2022-10-28 DIAGNOSIS — N184 Chronic kidney disease, stage 4 (severe): Secondary | ICD-10-CM | POA: Diagnosis not present

## 2022-10-28 DIAGNOSIS — E1122 Type 2 diabetes mellitus with diabetic chronic kidney disease: Secondary | ICD-10-CM

## 2022-10-28 DIAGNOSIS — Z720 Tobacco use: Secondary | ICD-10-CM

## 2022-10-28 DIAGNOSIS — I739 Peripheral vascular disease, unspecified: Secondary | ICD-10-CM

## 2022-10-28 DIAGNOSIS — E1169 Type 2 diabetes mellitus with other specified complication: Secondary | ICD-10-CM | POA: Diagnosis not present

## 2022-10-28 DIAGNOSIS — K3184 Gastroparesis: Secondary | ICD-10-CM

## 2022-10-28 DIAGNOSIS — E1143 Type 2 diabetes mellitus with diabetic autonomic (poly)neuropathy: Secondary | ICD-10-CM | POA: Diagnosis not present

## 2022-10-28 DIAGNOSIS — I152 Hypertension secondary to endocrine disorders: Secondary | ICD-10-CM

## 2022-10-28 DIAGNOSIS — E785 Hyperlipidemia, unspecified: Secondary | ICD-10-CM

## 2022-10-28 DIAGNOSIS — L989 Disorder of the skin and subcutaneous tissue, unspecified: Secondary | ICD-10-CM

## 2022-10-28 DIAGNOSIS — L2089 Other atopic dermatitis: Secondary | ICD-10-CM

## 2022-10-28 DIAGNOSIS — E1159 Type 2 diabetes mellitus with other circulatory complications: Secondary | ICD-10-CM

## 2022-10-28 DIAGNOSIS — R442 Other hallucinations: Secondary | ICD-10-CM

## 2022-10-28 LAB — POCT GLYCOSYLATED HEMOGLOBIN (HGB A1C): HbA1c, POC (controlled diabetic range): 5.9 % (ref 0.0–7.0)

## 2022-10-28 NOTE — Patient Instructions (Addendum)
Your A1c is excellent.  Good job.  Keep taking your Lantus as you are because you obviously know how to keep your sugar under controll  Take the hydralazine 25 mg tablet Three times a day. Keep taking Cardizem CD 180 mg tablet, two tablets once a day.      CoViD-19 Vaccine The CDC recommends the COVID-19 monovalent 2023-24 vaccination for everyone ages 5 months and older in the [place] for the prevention of COVID-19. There is no preferential recommendation for the use of any one COVID-19 vaccine over another when more than one recommended and age-appropriate vaccine is available.    SHINGLES VACCINATION  Chickenpox and shingles are related because they are caused by the same virus (varicella-zoster virus). After a person recovers from chickenpox, the virus stays dormant (inactive) in the body. It can reactivate years later and cause shingles.  CDC recommends that adults 22 years and older get two doses of the shingles vaccine called Shingrix (recombinant zoster vaccine) to prevent shingles and the complications from the disease.  Shingrix vaccination provides strong protection against shingles. In adults 50 years Shingrix is more than 90% effective at preventing shingles.  Studies show that Shingrix is safe. The vaccine helps your body create a strong defense against shingles. As a result, you are likely to have temporary side effects from getting the shots. Some people feel they are having a mild flu without the head cold symptoms.  The side effects might affect your ability to do normal daily activities for 2 to 3 days.  Getting the shot when you have a couple days without commitments is a good idea, in case you do feel under the weather.  Medicare prescription drug plans (Part D) usually cover all available vaccines needed to prevent illness, like the shingles (Shingrix) shot. Contact your Medicare drug plan If you are uncertain if they will cover the shingles vaccination.   Your  pharmacist can give you Shingrix as a shot in your upper arm. You will need two Shingrix vaccinations to complete your inoculation against shingles.  The second injection is given at least 2 months after the first Shingrix vaccination was given.

## 2022-10-29 ENCOUNTER — Encounter: Payer: Self-pay | Admitting: Family Medicine

## 2022-10-29 DIAGNOSIS — R442 Other hallucinations: Secondary | ICD-10-CM | POA: Insufficient documentation

## 2022-10-29 MED ORDER — DILTIAZEM HCL ER COATED BEADS 180 MG PO CP24: 360.0000 mg | ORAL_CAPSULE | Freq: Every day | ORAL | 1 refills | Status: DC

## 2022-10-29 NOTE — Assessment & Plan Note (Signed)
Established problem Uncontrolled.  Patient is not at goal of feeling and belief that insects are in her skin.  She scratches areas she believes to feel insects with resulting wounds that heal with scarring. Much of the scarring is in easily reached areas of shoulders, arms and legs.   During exam, [name] [name] again showed me matter she had recently scratched from her skin and placed on the room's paper towels. She stated the specimen was moving as we both looked at the specimen.  I did not observe any movement of the specimen. Upon close inspection, the specimen was a flat piece of matter of brown and white areas under 10x exam, ~2-3 mm in length and width. That appeared to me to be inanimate scabrous, organic material that I have come to associate with adherent matter from wounds.  None of her current skin wounds, healed or otherwise, appear infected.    [name] [name] became tearful and seemed upset when I disagreed with her assessment that the material presented was not an insect.  She said I was not listening to her.  I reviewed the opinion of the two previous dermatologist, who did not include infestation in their diagnosis of her complaint.    I recommended she return to taking 100 mg of quetiapine at bedtime from her current 50 mg as she has had significant decrease in the skin feeling of crawling insects when taking the higher dose in the past.    She said she has an appointment with a dermatologist in [facility] in a couple weeks.  I encouraged her to attend this consultation.    At the end of the visit, [name] [name] was talking in short sentences, and one word responses.  She appeared upset.

## 2022-10-29 NOTE — Progress Notes (Signed)
[name] [name] is alone Sources of clinical information for visit is/are patient. Nursing assessment for this office visit was reviewed with the patient for accuracy and revision.     Previous Report(s) Reviewed: recent office visit note with cardiology (Dr Gwenlyn Found) and [facility]net office visit note with ophthalmology (Dr Royce Macadamia)     09/09/2022    9:53 AM  Depression screen PHQ 2/9  Decreased Interest 0  Down, Depressed, Hopeless 0  PHQ - 2 Score 0  Altered sleeping 0  Tired, decreased energy 0  Change in appetite 0  Feeling bad or failure about yourself  0  Trouble concentrating 0  Moving slowly or fidgety/restless 0  Suicidal thoughts 0  PHQ-9 Score 0   Flowsheet Row Office Visit from 09/09/2022 in [place] Office Visit from 04/15/2022 in [place] Office Visit from 03/25/2022 in [place]  Thoughts that you would be better off dead, or of hurting yourself in some way Not at all Not at all Not at all  PHQ-9 Total Score 0 9 --          09/16/2021    3:01 PM 06/05/2020   10:31 AM 11/16/2018    2:50 PM 07/28/2018    8:51 AM 01/05/2018    8:59 AM  [place] in the past year? 0 1 0 No No  Number falls in past yr: 0 0 0    Injury with Fall? 0 1 0         09/09/2022    9:53 AM 04/15/2022    2:57 PM 03/25/2022    9:43 AM  PHQ9 SCORE ONLY  PHQ-9 Total Score 0 9 3    Adult vaccines due  Topic Date Due   TETANUS/TDAP  09/29/2023    Health Maintenance Due  Topic Date Due   Zoster Vaccines- Shingrix (1 of 2) Never done   Diabetic kidney evaluation - Urine ACR  03/04/2018   Medicare Annual Wellness (AWV)  09/28/2018   FOOT EXAM  07/10/2021   COVID-19 Vaccine (5 - Pfizer risk series) 02/18/2022      History/P.E. limitations: none  Adult vaccines due  Topic Date Due   TETANUS/TDAP  09/29/2023    Diabetes Health Maintenance Due  Topic Date Due   FOOT EXAM  07/10/2021   OPHTHALMOLOGY EXAM   02/23/2023   HEMOGLOBIN A1C  04/28/2023    Health Maintenance Due  Topic Date Due   Zoster Vaccines- Shingrix (1 of 2) Never done   Diabetic kidney evaluation - Urine ACR  03/04/2018   Medicare Annual Wellness (AWV)  09/28/2018   FOOT EXAM  07/10/2021   COVID-19 Vaccine (5 - Pfizer risk series) 02/18/2022     Chief Complaint  Patient presents with   Follow-up    DIABETES     --------------------------------------------------------------------------------------------------------------------------------------------- Visit Problem List with A/P  Hyperlipidemia associated with type 2 diabetes mellitus ([place]) Established problem Uncontrolled.  Patient is not at goal of taking the prescribed rosuvastatin. Encouraged [name] [name] to take her statin therapy. She said she would.    Diabetic gastroparesis associated with type 2 diabetes mellitus (HCC) Adequate symptom control. Continue current rescue medication regiment which includes as needed lorazepam, oxycodone and phenergan pr/po   Other atopic dermatitis Patient is not taking the prescribed dupixent - she reports that it was not helping her feeling of insects moving in her skin.   Skin disorder Established problem Uncontrolled.  Patient is  not at goal of feeling and belief that insects are in her skin.  She scratches areas she believes to feel insects with resulting wounds that heal with scarring. Much of the scarring is in easily reached areas of shoulders, arms and legs.   During exam, [name] [name] again showed me matter she had recently scratched from her skin and placed on the room's paper towels. She stated the specimen was moving as we both looked at the specimen.  I did not observe any movement of the specimen. Upon close inspection, the specimen was a flat piece of matter of brown and white areas under 10x exam, ~2-3 mm in length and width. That appeared to me to be inanimate scabrous, organic material that I have come to  associate with adherent matter from wounds.  None of her current skin wounds, healed or otherwise, appear infected.    [name] [name] became tearful and seemed upset when I disagreed with her assessment that the material presented was not an insect.  She said I was not listening to her.  I reviewed the opinion of the two previous dermatologist, who did not include infestation in their diagnosis of her complaint.    I recommended she return to taking 100 mg of quetiapine at bedtime from her current 50 mg as she has had significant decrease in the skin feeling of crawling insects when taking the higher dose in the past.    She said she has an appointment with a dermatologist in [facility] in a couple weeks.  I encouraged her to attend this consultation.    At the end of the visit, [name] [name] was talking in short sentences, and one word responses.  She appeared upset.     Hypertension associated with diabetes ([place]) Established problem Uncontrolled.  Patient is not at goal of BP < 130/80 (Chronic Kidney Disease). [name] [name] reported just having picked up her hydralazine 25 mg tablets, to take three times a day. She says she has not started them yet. I asked her to start the hydralazine and continue her Diltiazem 360 daily. She is to return in 4 weeks for a htn visit.    Option is clonidine    Type 2 diabetes mellitus with diabetic chronic kidney disease ([place]) Established problem. Adequate glycemic control.  Pt is tolerating the current medication regiment. Continue current treatment plan.

## 2022-10-29 NOTE — Assessment & Plan Note (Signed)
Patient is not taking the prescribed dupixent - she reports that it was not helping her feeling of insects moving in her skin.

## 2022-10-29 NOTE — Assessment & Plan Note (Signed)
Established problem Uncontrolled.  Patient is not at goal of BP < 130/80 (Chronic Kidney Disease). [name] [name] reported just having picked up her hydralazine 25 mg tablets, to take three times a day. She says she has not started them yet. I asked her to start the hydralazine and continue her Diltiazem 360 daily. She is to return in 4 weeks for a htn visit.    Option is clonidine

## 2022-10-29 NOTE — Assessment & Plan Note (Signed)
Adequate symptom control. Continue current rescue medication regiment which includes as needed lorazepam, oxycodone and phenergan pr/po

## 2022-10-29 NOTE — Assessment & Plan Note (Signed)
Established problem. Adequate glycemic control.  Pt is tolerating the current medication regiment. Continue current treatment plan.

## 2022-10-29 NOTE — Assessment & Plan Note (Signed)
Established problem Uncontrolled.  Patient is not at goal of taking the prescribed rosuvastatin. Encouraged [name] [name] to take her statin therapy. She said she would.

## 2022-11-05 ENCOUNTER — Ambulatory Visit (HOSPITAL_BASED_OUTPATIENT_CLINIC_OR_DEPARTMENT_OTHER)
Admission: RE | Admit: 2022-11-05 | Discharge: 2022-11-05 | Disposition: A | Discharge status: Home / Self Care | Payer: 59 | Source: Ambulatory Visit | Attending: Cardiovascular Disease | Admitting: Cardiovascular Disease

## 2022-11-05 ENCOUNTER — Ambulatory Visit (HOSPITAL_COMMUNITY)
Admission: RE | Admit: 2022-11-05 | Discharge: 2022-11-05 | Disposition: A | Discharge status: Home / Self Care | Payer: 59 | Source: Ambulatory Visit | Attending: Cardiovascular Disease | Admitting: Cardiovascular Disease

## 2022-11-05 DIAGNOSIS — E1159 Type 2 diabetes mellitus with other circulatory complications: Secondary | ICD-10-CM

## 2022-11-05 DIAGNOSIS — I739 Peripheral vascular disease, unspecified: Secondary | ICD-10-CM | POA: Insufficient documentation

## 2022-11-05 DIAGNOSIS — Z720 Tobacco use: Secondary | ICD-10-CM

## 2022-11-05 DIAGNOSIS — I152 Hypertension secondary to endocrine disorders: Secondary | ICD-10-CM

## 2022-11-05 DIAGNOSIS — R0989 Other specified symptoms and signs involving the circulatory and respiratory systems: Secondary | ICD-10-CM

## 2022-11-05 DIAGNOSIS — E1169 Type 2 diabetes mellitus with other specified complication: Secondary | ICD-10-CM

## 2022-11-05 DIAGNOSIS — E785 Hyperlipidemia, unspecified: Secondary | ICD-10-CM | POA: Insufficient documentation

## 2022-11-08 ENCOUNTER — Encounter: Payer: Self-pay | Admitting: Family Medicine

## 2022-11-08 ENCOUNTER — Telehealth: Payer: Self-pay

## 2022-11-08 DIAGNOSIS — I6529 Occlusion and stenosis of unspecified carotid artery: Secondary | ICD-10-CM | POA: Insufficient documentation

## 2022-11-08 DIAGNOSIS — E1143 Type 2 diabetes mellitus with diabetic autonomic (poly)neuropathy: Secondary | ICD-10-CM

## 2022-11-08 HISTORY — DX: Occlusion and stenosis of unspecified carotid artery: I65.29

## 2022-11-08 NOTE — Telephone Encounter (Signed)
Patient calls nurse line requesting refills on Oxycodone and Lorazepam.   I do not see Oxycodone on her current medication list.   Will forward to PCP.  [facility] on [address].

## 2022-11-09 NOTE — Telephone Encounter (Signed)
Patient returns call to nurse line to check status of prescription refills. Please advise.   Talbot Grumbling, RN

## 2022-11-10 ENCOUNTER — Encounter: Payer: Self-pay | Admitting: Internal Medicine

## 2022-11-10 MED ORDER — OXYCODONE HCL 5 MG PO TABS: 5.0000 mg | ORAL_TABLET | ORAL | 0 refills | Status: DC | PRN

## 2022-11-10 NOTE — Telephone Encounter (Signed)
Oxycodone 5 mg  refill #30 sent

## 2022-11-11 ENCOUNTER — Ambulatory Visit: Payer: 59 | Attending: Cardiovascular Disease | Admitting: Cardiovascular Disease

## 2022-11-11 ENCOUNTER — Encounter: Payer: Self-pay | Admitting: Cardiovascular Disease

## 2022-11-11 VITALS — BP 138/62 | HR 65 | Ht 61.0 in | Wt 158.8 lb

## 2022-11-11 DIAGNOSIS — E785 Hyperlipidemia, unspecified: Secondary | ICD-10-CM

## 2022-11-11 DIAGNOSIS — I739 Peripheral vascular disease, unspecified: Secondary | ICD-10-CM

## 2022-11-11 DIAGNOSIS — E1169 Type 2 diabetes mellitus with other specified complication: Secondary | ICD-10-CM | POA: Diagnosis not present

## 2022-11-11 NOTE — Patient Instructions (Addendum)
Medication Instructions:  Your physician recommends that you continue on your current medications as directed. Please refer to the Current Medication list given to you today.  *If you need a refill on your cardiac medications before your next appointment, please call your pharmacy*   Follow-Up: At [facility], you and your health needs are our priority.  As part of our continuing mission to provide you with exceptional heart care, we have created designated Provider Care Teams.  These Care Teams include your primary Cardiologist (physician) and Advanced Practice Providers (APPs -  Physician Assistants and Nurse Practitioners) who all work together to provide you with the care you need, when you need it.  We recommend signing up for the patient portal called "MyChart".  Sign up information is provided on this After Visit Summary.  MyChart is used to connect with patients for Virtual Visits (Telemedicine).  Patients are able to view lab/test results, encounter notes, upcoming appointments, etc.  Non-urgent messages can be sent to your provider as well.   To learn more about what you can do with MyChart, go to [email or web address].    Your next appointment:   6 month(s)  The format for your next appointment:   In Person  Provider:   Almyra Deforest, PA-C       Then, Quay Burow, MD  will plan to see you again in 12 month(s).    Other Instructions Dr. Gwenlyn Found would like for you to come back for an appointment with PharmD to discuss alternative cholesterol therapy.

## 2022-11-11 NOTE — Progress Notes (Signed)
[name] [name] returns today for follow-up of her Doppler studies that were performed because of left calf claudication on 11/05/2022.  Her right ABI was 0.86 and her left was 0.65.  She did have a high-frequency signal in her proximal left SFA.  Unfortunately, she is a diabetic with a serum creatinine in the 3 range making intervention problematic.  At this point, we talked about the risks and benefits and I think she would have a high probability of having renal failure.  We could do this with CO2 but I could not guarantee her that I would not have to use contrast.  I will arrange for her to see an APP back in 6 months and me back in a year.  [name] [name], M.D., [place], [facility], [place] [address] [place], [place]  [address]  [phone] 11/11/2022 12:05 PM

## 2022-11-22 ENCOUNTER — Other Ambulatory Visit: Payer: Self-pay

## 2022-11-22 DIAGNOSIS — R1115 Cyclical vomiting syndrome unrelated to migraine: Secondary | ICD-10-CM

## 2022-11-22 DIAGNOSIS — K3184 Gastroparesis: Secondary | ICD-10-CM

## 2022-11-23 MED ORDER — LORAZEPAM 1 MG PO TABS: 1.0000 mg | ORAL_TABLET | Freq: Two times a day (BID) | ORAL | 0 refills | Status: DC | PRN

## 2022-12-02 ENCOUNTER — Ambulatory Visit: Payer: 59 | Admitting: Family Medicine

## 2022-12-02 ENCOUNTER — Ambulatory Visit (INDEPENDENT_AMBULATORY_CARE_PROVIDER_SITE_OTHER): Payer: 59

## 2022-12-02 DIAGNOSIS — Z23 Encounter for immunization: Secondary | ICD-10-CM | POA: Diagnosis not present
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
## 2022-12-07 NOTE — Assessment & Plan Note (Addendum)
Assessment:  LDL goal: <55 mg/dl last LDLc 161 mg/dl (10 /0960) Currently not on any lipid lowering agent   Unable to tolerate  moderate intensity and low intensity statinsand ezetimibe - severe muscle cramps affects mobility Discussed next potential options (PCSK-9 inhibitors, bempedoic acid and inclisiran); cost, dosing efficacy, side effects  Given the risk factors and patient's preference initiating PCSK9i if covered by insurance   Plan: Will apply for PA for PCSK9i; will inform patient upon approval  Lipid lab due in 2-3 months after starting [facility]

## 2022-12-08 ENCOUNTER — Telehealth: Payer: Self-pay | Admitting: Pharmacist

## 2022-12-08 ENCOUNTER — Ambulatory Visit: Payer: 59 | Attending: Cardiovascular Disease | Admitting: Student

## 2022-12-08 DIAGNOSIS — E1169 Type 2 diabetes mellitus with other specified complication: Secondary | ICD-10-CM | POA: Diagnosis not present

## 2022-12-08 DIAGNOSIS — E785 Hyperlipidemia, unspecified: Secondary | ICD-10-CM

## 2022-12-08 MED ORDER — REPATHA SURECLICK 140 MG/ML ~~LOC~~ SOAJ: 140.0000 mg | SUBCUTANEOUS | 3 refills | Status: DC

## 2022-12-08 NOTE — Patient Instructions (Signed)
Your Results:             Your most recent labs Goal  Total Cholesterol 194 < 200  Triglycerides 88 < 150  HDL (happy/good cholesterol) 65 > 40  LDL (lousy/bad cholesterol) 113 < 55   Medication changes: We will start the process to get PCSK9i (Praluent or Repatha)  covered by your insurance.  Once the prior authorization is complete, we will call you to let you know and confirm pharmacy information.      Praluent is a cholesterol medication that improved your body's ability to get rid of "bad cholesterol" known as LDL. It can lower your LDL up to 60%. It is an injection that is given under the skin every 2 weeks. The most common side effects of Praluent include runny nose, symptoms of the common cold, rarely flu or flu-like symptoms, back/muscle pain in about 3-4% of the patients, and redness, pain, or bruising at the injection site.    Repatha is a cholesterol medication that improved your body's ability to get rid of "bad cholesterol" known as LDL. It can lower your LDL up to 60%! It is an injection that is given under the skin every 2 weeks. The most common side effects of Repatha include runny nose, symptoms of the common cold, rarely flu or flu-like symptoms, back/muscle pain in about 3-4% of the patients, and redness, pain, or bruising at the injection site.   Lab orders: We want to repeat labs after 2-3 months.  We will send you a lab order to remind you once we get closer to that time.

## 2022-12-08 NOTE — Telephone Encounter (Signed)
Repatha PA approved through 06/09/2023  (Key: [id number]) Prescription - 3 months with 3 refills sent to the patient's preferred pharmacy

## 2022-12-09 ENCOUNTER — Other Ambulatory Visit: Payer: Self-pay

## 2022-12-09 DIAGNOSIS — E1143 Type 2 diabetes mellitus with diabetic autonomic (poly)neuropathy: Secondary | ICD-10-CM

## 2022-12-09 MED ORDER — OXYCODONE HCL 5 MG PO TABS: 5.0000 mg | ORAL_TABLET | ORAL | 0 refills | Status: DC | PRN

## 2022-12-10 LAB — HM DIABETES EYE EXAM

## 2022-12-14 ENCOUNTER — Ambulatory Visit (AMBULATORY_SURGERY_CENTER): Payer: Self-pay | Admitting: *Deleted

## 2022-12-14 ENCOUNTER — Telehealth: Payer: Self-pay | Admitting: *Deleted

## 2022-12-14 VITALS — Ht 61.0 in | Wt 158.0 lb

## 2022-12-14 DIAGNOSIS — Z860100 Personal history of colon polyps, unspecified: Secondary | ICD-10-CM

## 2022-12-14 MED ORDER — ONDANSETRON HCL 4 MG PO TABS: 4.0000 mg | ORAL_TABLET | Freq: Three times a day (TID) | ORAL | 1 refills | Status: DC | PRN

## 2022-12-14 MED ORDER — NA SULFATE-K SULFATE-MG SULF 17.5-3.13-1.6 GM/177ML PO SOLN: 1.0000 | Freq: Once | ORAL | 0 refills | Status: AC

## 2022-12-14 NOTE — Progress Notes (Signed)
Previsti completed over telephone. No egg or soy allergy known to patient  No issues known to pt with past sedation with any surgeries or procedures Patient denies ever being told they had issues or difficulty with intubation  No FH of Malignant Hyperthermia Pt is not on diet pills Pt is not on  home 02  Pt is not on blood thinners  Pt denies issues with constipation. Pt has issues with constipation instructed  pt doing two day prep Pt encouraged to use to use Singlecare or Goodrx to reduce cost  Patient's chart reviewed by [name] [name] CNRA prior to previsit and patient appropriate for the [place].  Previsit completed and red dot placed by patient's name on their procedure day (on provider's schedule).  Marland Kitchen

## 2022-12-14 NOTE — Telephone Encounter (Signed)
Attempted to reach pt  via phone. LM with # for return call.

## 2022-12-16 ENCOUNTER — Telehealth: Payer: Self-pay

## 2022-12-16 NOTE — Telephone Encounter (Signed)
Patient calls nurse line requesting a prescription for cough.   She reports symptoms started 2 days ago. She denies any fevers, congestion, headaches or SOB.   She denies any sick contacts.   She reports the cough is keeping her up at night and upsetting her stomach. She reports she has been "dry heaving" after coughing spells.   Patient reports she can not take any OTC cough medications due to her kidneys.   Will forward to PCP for advisement.

## 2022-12-17 MED ORDER — DEXTROMETHORPHAN HBR 15 MG/5ML PO SYRP: 10.0000 mL | ORAL_SOLUTION | Freq: Four times a day (QID) | ORAL | 0 refills | Status: DC | PRN

## 2022-12-17 NOTE — Telephone Encounter (Signed)
Spoke with patient and informed her of message that was left. Salvatore Marvel, CMA

## 2022-12-17 NOTE — Telephone Encounter (Signed)
Please let patient know cough medicine that is safe to take with kidney disease has been sent to her pharmacy.

## 2022-12-26 ENCOUNTER — Other Ambulatory Visit: Payer: Self-pay

## 2022-12-26 ENCOUNTER — Encounter (HOSPITAL_COMMUNITY): Payer: Self-pay | Admitting: *Deleted

## 2022-12-26 ENCOUNTER — Emergency Department (HOSPITAL_COMMUNITY)
Admission: EM | Admit: 2022-12-26 | Discharge: 2022-12-27 | Discharge status: Against Medical Advice | Payer: 59 | Attending: Emergency Medicine | Admitting: Emergency Medicine

## 2022-12-26 ENCOUNTER — Telehealth: Payer: Self-pay | Admitting: Family Medicine

## 2022-12-26 DIAGNOSIS — E1122 Type 2 diabetes mellitus with diabetic chronic kidney disease: Secondary | ICD-10-CM | POA: Insufficient documentation

## 2022-12-26 DIAGNOSIS — R112 Nausea with vomiting, unspecified: Secondary | ICD-10-CM | POA: Insufficient documentation

## 2022-12-26 DIAGNOSIS — N189 Chronic kidney disease, unspecified: Secondary | ICD-10-CM | POA: Insufficient documentation

## 2022-12-26 DIAGNOSIS — M62838 Other muscle spasm: Secondary | ICD-10-CM | POA: Insufficient documentation

## 2022-12-26 DIAGNOSIS — Z794 Long term (current) use of insulin: Secondary | ICD-10-CM | POA: Diagnosis not present

## 2022-12-26 DIAGNOSIS — Z5329 Procedure and treatment not carried out because of patient's decision for other reasons: Secondary | ICD-10-CM | POA: Insufficient documentation

## 2022-12-26 MED ORDER — DIAZEPAM 5 MG PO TABS
5.0000 mg | ORAL_TABLET | Freq: Once | ORAL | Status: AC
  Administered 2022-12-26: 5 mg via ORAL
  Filled 2022-12-26: qty 1

## 2022-12-26 NOTE — Telephone Encounter (Signed)
Patient calls after hours emergency line crying. States she has been dealing with muscle spasms in her neck that started 2 days ago that has been worsening. Has had this before and muscle relaxers have helped in the past and state she was put on it for 10 days before. States it starts with an M , was taken off ofTizanidine. States pain goes around from left side of neck to the right. Seems like its stuck on the center of her spine. Having some headaches, can't sleep. No vision changes. Denies extremity weakness. Recommended being seen in urgent care or the ED given how much pain she is in and they can do imaging if needed as well as treatment especially since the clinic is not open until Tuesday. Patient expressed understanding.

## 2022-12-26 NOTE — ED Triage Notes (Signed)
The pt is c/o a stiff neck for 2-3 days no known injury  she has a history of the same and she drove herself here

## 2022-12-26 NOTE — ED Provider Triage Note (Signed)
Emergency Medicine Provider Triage Evaluation Note  [name] [name] , a 56 y.o. female  was evaluated in triage.  Pt complains of left-sided neck strain.  Patient reports that she gets this quite frequently and woke up this morning with it.  No trauma to the area.  She has not taken any medication.  She reports that she has got this before and usually comes in the emergency department this feels similar to her last.  No recent fever cough or cold symptoms..  Review of Systems  Positive:  Negative:   Physical Exam  BP (!) 167/71   Pulse 80   Temp 98.6 F (37 C)   Resp (!) 22   Ht '5\' 1"'$  (1.549 m)   Wt 71.7 kg   SpO2 100%   BMI 29.87 kg/m  Gen:   Awake, no distress   Resp:  Normal effort  MSK:   Moves extremities without difficulty  Other:  Large palpable muscle over the left trap.  Significant tenderness palpation.  Limited range of motion due to pain  Medical Decision Making  Medically screening exam initiated at 5:10 PM.  Appropriate orders placed.  [name] [name] was informed that the remainder of the evaluation will be completed by another provider, this initial triage assessment does not replace that evaluation, and the importance of remaining in the ED until their evaluation is complete.  Valium ordered   Sherrell Puller, PA-C 12/26/22 1712

## 2022-12-27 DIAGNOSIS — M62838 Other muscle spasm: Secondary | ICD-10-CM | POA: Diagnosis not present

## 2022-12-27 MED ORDER — PROMETHAZINE HCL 25 MG PO TABS
25.0000 mg | ORAL_TABLET | Freq: Four times a day (QID) | ORAL | Status: DC | PRN
  Administered 2022-12-27: 25 mg via ORAL
  Filled 2022-12-27: qty 1

## 2022-12-27 MED ORDER — DILTIAZEM HCL ER COATED BEADS 180 MG PO CP24
360.0000 mg | ORAL_CAPSULE | Freq: Every day | ORAL | Status: DC
  Filled 2022-12-27: qty 2

## 2022-12-27 MED ORDER — LORAZEPAM 1 MG PO TABS
1.0000 mg | ORAL_TABLET | Freq: Two times a day (BID) | ORAL | Status: DC | PRN
  Administered 2022-12-27: 1 mg via ORAL
  Filled 2022-12-27: qty 1

## 2022-12-27 MED ORDER — ONDANSETRON HCL 4 MG PO TABS
4.0000 mg | ORAL_TABLET | Freq: Three times a day (TID) | ORAL | Status: DC | PRN
  Filled 2022-12-27: qty 1

## 2022-12-27 MED ORDER — HYDRALAZINE HCL 10 MG PO TABS
10.0000 mg | ORAL_TABLET | Freq: Three times a day (TID) | ORAL | Status: DC
  Administered 2022-12-27: 10 mg via ORAL
  Filled 2022-12-27: qty 1

## 2022-12-27 MED ORDER — METHOCARBAMOL 500 MG PO TABS
500.0000 mg | ORAL_TABLET | Freq: Once | ORAL | Status: AC
  Administered 2022-12-27: 500 mg via ORAL
  Filled 2022-12-27: qty 1

## 2022-12-27 NOTE — ED Notes (Signed)
Notified Dr Jeanell Sparrow face to face of pt's elevated bp.

## 2022-12-27 NOTE — ED Notes (Signed)
Pt kept moving during bp reading. Pt stated she was in pain and couldn't stay still

## 2022-12-27 NOTE — ED Provider Notes (Signed)
[facility]PART[facility] Provider Note   CSN: [id number] Arrival date & time: 12/26/22  1510     History  No chief complaint on file.   [name] [name] is a 57 y.o. female.  HPI 57 year old female history of type 2 diabetes, cyclical vomiting syndrome, CKD, diabetes, marijuana use disorder, presents today complaining of spasm of the left neck muscle.  States that she has had this multiple times in the past after sleeping wrong.  She points to the left trapezius muscle.  She states that she is having pain in this area and spasm.  She denies any swelling, redness, IV drug abuse, fever, or chills.  Her neck is not stiff but is painful when she tries to move it to the left in this area.  She was given 5 mg Valium prior to my evaluation.  Her neck is still painful.  She also states that since she has been here she has become nauseated from the pain and has had vomiting.  She identifies this is consistent with her usual cyclical vomiting syndrome and shows me a bag with clear emesis in it.      Home Medications Prior to Admission medications   Medication Sig Start Date End Date Taking? Authorizing Provider  dextromethorphan 15 MG/5ML syrup Take 10 mLs (30 mg total) by mouth 4 (four) times daily as needed for cough. 12/17/22   McDiarmid, Blane Ohara, MD  diltiazem (CARDIZEM CD) 180 MG 24 hr capsule Take 2 capsules (360 mg total) by mouth daily. 10/29/22   McDiarmid, Blane Ohara, MD  Dupilumab (DUPIXENT) 300 MG/2ML SOPN Inject 300 mg into the skin every 14 (fourteen) days. Starting at day 15 for maintenance. Patient not taking: Reported on 11/11/2022 09/06/22   Ralene Bathe, MD  Evolocumab (REPATHA SURECLICK) 093 MG/ML SOAJ Inject 140 mg into the skin every 14 (fourteen) days. Patient not taking: Reported on 12/14/2022 12/08/22   Lorretta Harp, MD  hydrALAZINE (APRESOLINE) 10 MG tablet Take 10 mg by mouth 3 (three) times daily. 06/09/22   [provider]   insulin glargine (LANTUS SOLOSTAR) 100 UNIT/ML Solostar Pen Inject 8 Units into the skin at bedtime. 09/04/22   Katsadouros, Vasilios, MD  Insulin Pen Needle (B-D UF III MINI PEN NEEDLES) 31G X 5 MM MISC USE UNDER THE SKIN TWICE DAILY AS DIRECTED 06/03/22   McDiarmid, Blane Ohara, MD  Insulin Pen Needle (NOVOFINE PLUS PEN NEEDLE) 32G X 4 MM MISC  injection twice per day. 07/16/21   McDiarmid, Blane Ohara, MD  Lancets (ONETOUCH ULTRASOFT) lancets USE TO CHECK BLOOD SUGAR UP TO FOUR TIMES DAILY 10/08/22   McDiarmid, Blane Ohara, MD  lidocaine (LIDODERM) 5 % Place 1 patch onto the skin daily. Remove & Discard patch within 12 hours or as directed by MD 09/05/22   Riesa Pope, MD  linagliptin (TRADJENTA) 5 MG TABS tablet Take 1 tablet (5 mg total) by mouth daily. 07/22/22   McDiarmid, Blane Ohara, MD  LORazepam (ATIVAN) 1 MG tablet Take 1 tablet (1 mg total) by mouth 2 (two) times daily as needed for anxiety. 11/23/22   McDiarmid, Blane Ohara, MD  ondansetron (ZOFRAN) 4 MG tablet Take 1 tablet (4 mg total) by mouth every 8 (eight) hours as needed for nausea or vomiting. 12/14/22   Irene Shipper, MD  [facility] VERIO test strip 3 (three) times daily. 11/11/22   [provider]  oxyCODONE (OXY IR/ROXICODONE) 5 MG immediate release tablet Take 1 tablet (5 mg  total) by mouth every 4 (four) hours as needed for severe pain. 12/09/22 01/08/23  McDiarmid, Blane Ohara, MD  promethazine (PHENERGAN) 25 MG tablet Take 1 tablet (25 mg total) by mouth every 6 (six) hours as needed for nausea or vomiting. 08/26/22   McDiarmid, Blane Ohara, MD  QUEtiapine (SEROQUEL) 50 MG tablet TAKE 1 TABLET(50 MG) BY MOUTH AT BEDTIME Patient taking differently: Take 50 mg by mouth daily as needed (For sleep per patient). 01/14/22   McDiarmid, Blane Ohara, MD      Allergies    Bee venom, Tegaderm ag mesh [silver], Acetaminophen, Novolog [insulin aspart], Semaglutide, Statins, Amlodipine, Clonidine derivatives, Flexeril [cyclobenzaprine], Lyrica [pregabalin],  Nsaids, Ozempic (0.25 or 0.5 mg-dose) [semaglutide(0.25 or 0.'5mg'$ -dos)], Tape, Ceftriaxone, and Erythromycin    Review of Systems   Review of Systems  Physical Exam Updated Vital Signs BP (!) 214/119   Pulse 86   Temp 97.9 F (36.6 C) (Axillary)   Resp 18   Ht 1.549 m ('5\' 1"'$ )   Wt 71.7 kg   SpO2 98%   BMI 29.87 kg/m  Physical Exam Vitals and nursing note (Patient is significantly hypertensive with blood pressure 215/87 with known underlying high blood pressure who has not taken her medications now since yesterday) reviewed.  Constitutional:      General: She is in acute distress.     Appearance: She is obese. She is not ill-appearing.  HENT:     Head: Normocephalic.     Right Ear: External ear normal.     Left Ear: External ear normal.     Nose: Nose normal.     Mouth/Throat:     Mouth: Mucous membranes are moist.  Eyes:     Pupils: Pupils are equal, round, and reactive to light.  Neck:     Comments: Diffuse tenderness to palpation of her left trapezius No warmth or palpable masses noted Trachea is midline Full active range of motion of neck No point tenderness over cervical spine Cardiovascular:     Rate and Rhythm: Normal rate and regular rhythm.     Pulses: Normal pulses.  Pulmonary:     Effort: Pulmonary effort is normal.     Breath sounds: Normal breath sounds.  Abdominal:     Palpations: Abdomen is soft.  Musculoskeletal:        General: Normal range of motion.     Cervical back: Normal range of motion. Tenderness present.  Skin:    General: Skin is warm.  Neurological:     General: No focal deficit present.     Mental Status: She is alert.     Cranial Nerves: No cranial nerve deficit.     Motor: No weakness.  Psychiatric:        Mood and Affect: Affect is labile and tearful.        Speech: Speech normal.        Behavior: Behavior normal.        Thought Content: Thought content normal.     ED Results / Procedures / Treatments   Labs (all labs  ordered are listed, but only abnormal results are displayed) Labs Reviewed - No data to display  EKG None  Radiology No results found.  Procedures Procedures    Medications Ordered in ED Medications  diltiazem (CARDIZEM CD) 24 hr capsule 360 mg (has no administration in time range)  hydrALAZINE (APRESOLINE) tablet 10 mg (10 mg Oral Given 12/27/22 0904)  LORazepam (ATIVAN) tablet 1 mg (1 mg Oral Given 12/27/22  6147)  ondansetron (ZOFRAN) tablet 4 mg (4 mg Oral Patient Refused/Not Given 12/27/22 0844)  promethazine (PHENERGAN) tablet 25 mg (25 mg Oral Given 12/27/22 0844)  diazepam (VALIUM) tablet 5 mg (5 mg Oral Given 12/26/22 2110)  methocarbamol (ROBAXIN) tablet 500 mg (500 mg Oral Given 12/27/22 0846)    ED Course/ Medical Decision Making/ A&P                           Medical Decision Making Risk Prescription drug management.   Patient presented with neck pain most consistent with trapezius strain Patient treated with with Robaxin Patient began complaining of her cyclical vomiting syndrome.  She had medications ordered here in the ED.  Her blood pressure is elevated consistent with known hypertension has not taken her meds this morning. Multiple medications ordered. Nursing informs me that patient left due to not getting pain medication.  This occurred prior to my reevaluation.        Final Clinical Impression(s) / ED Diagnoses Final diagnoses:  Trapezius muscle spasm    Rx / DC Orders ED Discharge Orders     None         Pattricia Boss, MD 12/27/22 [phone]

## 2022-12-27 NOTE — ED Notes (Signed)
Pt upset that she can't get a "pain shot." Pt stated she wasted her time coming here. Pt left AMA

## 2022-12-27 NOTE — ED Notes (Signed)
Pt denies numbness and tingling. Pt is able to move all extremities. Pt moves her entire body when turning

## 2022-12-29 ENCOUNTER — Telehealth: Payer: Self-pay

## 2022-12-29 NOTE — Telephone Encounter (Signed)
Patient calls nurse line regarding continued issues with muscle spasms in neck. She was recently seen in the ED on 12/31 for this concern.   She reports that the methocarbamol has helped in the past, however, she only received one time dose in the ED. She is requesting refill on this as well as recommendations on pain management. She has oxycodone 5 mg that she takes for chronic pain up to 4 times daily. She is hesitant to take this due to not wanting to run out before she is supposed to.   Attempted to schedule patient follow up appointment, however, patient lives in [place]. She declines to schedule at this time.   Please advise how patient should proceed.   Talbot Grumbling, RN

## 2022-12-30 MED ORDER — METHOCARBAMOL 500 MG PO TABS: 500.0000 mg | ORAL_TABLET | Freq: Four times a day (QID) | ORAL | 0 refills | Status: DC

## 2022-12-30 NOTE — Telephone Encounter (Signed)
Spoke with patient informed her of the Rx that was sent in for the muscle relaxer, but she is now stated that she needs to know what she can take for pain. Since she can not take OTC medication because of her stomach. Salvatore Marvel, CMA

## 2023-01-07 ENCOUNTER — Other Ambulatory Visit: Payer: Self-pay

## 2023-01-07 DIAGNOSIS — E1143 Type 2 diabetes mellitus with diabetic autonomic (poly)neuropathy: Secondary | ICD-10-CM

## 2023-01-07 DIAGNOSIS — R1115 Cyclical vomiting syndrome unrelated to migraine: Secondary | ICD-10-CM

## 2023-01-07 MED ORDER — LORAZEPAM 1 MG PO TABS: 1.0000 mg | ORAL_TABLET | Freq: Two times a day (BID) | ORAL | 0 refills | Status: DC | PRN

## 2023-01-07 MED ORDER — OXYCODONE HCL 5 MG PO TABS: 5.0000 mg | ORAL_TABLET | ORAL | 0 refills | Status: DC | PRN

## 2023-01-14 ENCOUNTER — Telehealth: Payer: Self-pay | Admitting: Internal Medicine

## 2023-01-14 ENCOUNTER — Encounter: Payer: 59 | Admitting: Internal Medicine

## 2023-01-14 NOTE — Telephone Encounter (Signed)
FYI

## 2023-01-14 NOTE — Telephone Encounter (Signed)
Thank you Janett Billow.  Linda, 1.  The patient should reschedule.  She has a history of multiple adenomatous colon polyps in 2020 2.  She needs double prep due to fair prep in the past 3.  She may need nausea medicine such as Zofran to take before each prep session Thanks Dr. Henrene Pastor

## 2023-01-14 NOTE — Telephone Encounter (Signed)
[name] [name] please see note below from Dr. Henrene Pastor. Pt needs to reschedule colon with a double prep and may need nausea meds. Please schedule pt for previsit and colon.

## 2023-01-14 NOTE — Telephone Encounter (Signed)
Inbound call from patient, stated she received a message about her appointment. However patient woke up this morning feeling sick because of her prep medication. Stated she could not come in.

## 2023-01-15 ENCOUNTER — Other Ambulatory Visit: Payer: Self-pay | Admitting: Family Medicine

## 2023-01-15 DIAGNOSIS — I152 Hypertension secondary to endocrine disorders: Secondary | ICD-10-CM

## 2023-01-15 DIAGNOSIS — L989 Disorder of the skin and subcutaneous tissue, unspecified: Secondary | ICD-10-CM

## 2023-01-27 ENCOUNTER — Ambulatory Visit (INDEPENDENT_AMBULATORY_CARE_PROVIDER_SITE_OTHER): Payer: 59 | Admitting: Family Medicine

## 2023-01-27 ENCOUNTER — Encounter: Payer: Self-pay | Admitting: Family Medicine

## 2023-01-27 ENCOUNTER — Telehealth: Payer: Self-pay | Admitting: Family Medicine

## 2023-01-27 VITALS — BP 138/58 | HR 65 | Ht 61.0 in | Wt 158.1 lb

## 2023-01-27 DIAGNOSIS — E1122 Type 2 diabetes mellitus with diabetic chronic kidney disease: Secondary | ICD-10-CM | POA: Diagnosis not present

## 2023-01-27 DIAGNOSIS — R1115 Cyclical vomiting syndrome unrelated to migraine: Secondary | ICD-10-CM | POA: Diagnosis not present

## 2023-01-27 DIAGNOSIS — N184 Chronic kidney disease, stage 4 (severe): Secondary | ICD-10-CM | POA: Diagnosis not present

## 2023-01-27 DIAGNOSIS — M62838 Other muscle spasm: Secondary | ICD-10-CM

## 2023-01-27 DIAGNOSIS — Z794 Long term (current) use of insulin: Secondary | ICD-10-CM

## 2023-01-27 DIAGNOSIS — E1159 Type 2 diabetes mellitus with other circulatory complications: Secondary | ICD-10-CM

## 2023-01-27 DIAGNOSIS — Z86010 Personal history of colonic polyps: Secondary | ICD-10-CM

## 2023-01-27 DIAGNOSIS — I152 Hypertension secondary to endocrine disorders: Secondary | ICD-10-CM

## 2023-01-27 LAB — POCT GLYCOSYLATED HEMOGLOBIN (HGB A1C): HbA1c, POC (controlled diabetic range): 6.3 % (ref 0.0–7.0)

## 2023-01-27 MED ORDER — METHOCARBAMOL 500 MG PO TABS: 500.0000 mg | ORAL_TABLET | Freq: Four times a day (QID) | ORAL | 2 refills | Status: DC

## 2023-01-27 NOTE — Assessment & Plan Note (Signed)
Established problem. Adequate blood pressure control.  No evidence of new end organ damage.  Tolerating medication without significant adverse effects.  Plan to continue current blood pressure medication regiment.   

## 2023-01-27 NOTE — Assessment & Plan Note (Addendum)
Established problem. Lab Results  Component Value Date   HGBA1C 6.3 01/27/2023    Adequate glycemic control.  Pt is tolerating the current medication regiment. No hypoglycemic episodes Continue current treatment plan.

## 2023-01-27 NOTE — Patient Instructions (Signed)
Your blood sugar control is excellent.  Keep up the good work.  It seems to be working for you very well.

## 2023-01-27 NOTE — Progress Notes (Signed)
[name] [name] is alone Sources of clinical information for visit is/are patient. Nursing assessment for this office visit was reviewed with the patient for accuracy and revision.     Previous Report(s) Reviewed: Cardiology Consult note by Dr Gwenlyn Found, ED visit for trapezius muscle spasm    01/27/2023   10:50 AM  Depression screen PHQ 2/9  Decreased Interest 0  Down, Depressed, Hopeless 0  PHQ - 2 Score 0  Altered sleeping 0  Tired, decreased energy 0  Change in appetite 0  Feeling bad or failure about yourself  0  Trouble concentrating 0  Moving slowly or fidgety/restless 0  Suicidal thoughts 0  PHQ-9 Score 0   Flowsheet Row Office Visit from 01/27/2023 in [place] Office Visit from 09/09/2022 in [facility] Office Visit from 04/15/2022 in [facility]  Thoughts that you would be better off dead, or of hurting yourself in some way Not at all Not at all Not at all  PHQ-9 Total Score 0 0 9          09/16/2021    3:01 PM 06/05/2020   10:31 AM 11/16/2018    2:50 PM 07/28/2018    8:51 AM 01/05/2018    8:59 AM  [place] in the past year? 0 1 0 No No  Number falls in past yr: 0 0 0    Injury with Fall? 0 1 0         01/27/2023   10:50 AM 09/09/2022    9:53 AM 04/15/2022    2:57 PM  PHQ9 SCORE ONLY  PHQ-9 Total Score 0 0 9    There are no preventive care reminders to display for this patient.  Health Maintenance Due  Topic Date Due   Zoster Vaccines- Shingrix (1 of 2) Never done   Diabetic kidney evaluation - Urine ACR  09/27/2014   Medicare Annual Wellness (AWV)  09/28/2018   FOOT EXAM  07/10/2021   COLONOSCOPY (Pts 45-63yr Insurance coverage will need to be confirmed)  11/19/2022   COVID-19 Vaccine (6 - 2023-24 season) 01/27/2023      History/P.E. limitations: none  There are no preventive care reminders to display for this patient.  Diabetes Health Maintenance Due  Topic Date Due   FOOT EXAM   07/10/2021   HEMOGLOBIN A1C  07/28/2023   OPHTHALMOLOGY EXAM  12/11/2023    Health Maintenance Due  Topic Date Due   Zoster Vaccines- Shingrix (1 of 2) Never done   Diabetic kidney evaluation - Urine ACR  09/27/2014   Medicare Annual Wellness (AWV)  09/28/2018   FOOT EXAM  07/10/2021   COLONOSCOPY (Pts 45-481yrInsurance coverage will need to be confirmed)  11/19/2022   COVID-19 Vaccine (6 - 2023-24 season) 01/27/2023     Chief Complaint  Patient presents with   Follow-up    a1c     --------------------------------------------------------------------------------------------------------------------------------------------- Visit Problem List with A/P  Cyclic vomiting syndrome Established problem. Stable. Patient is at goal of few attacks, all managed at home.  Continue current abortive regiment: oxycodone 5 mg by mouth, Phenergan by mouth or per rectum, and Lorazepam 1 mg.  May repeat one time, if no relief of nausea & vomiting, then patient should seek rescue theray at ED, including IV hydration and IV abortive regiment.    Hypert[place] associated with [place]. Adequate blood pressure control.  No evidence of new end organ damage.  Tolerating medication  without significant adverse effects.  Plan to continue current blood pressure medication regiment.    Type 2 diabetes mellitus with diabetic chronic kidney disease ([place]) Established problem. Lab Results  Component Value Date   HGBA1C 6.3 01/27/2023    Adequate glycemic control.  Pt is tolerating the current medication regiment. No hypoglycemic episodes Continue current treatment plan.

## 2023-01-27 NOTE — Telephone Encounter (Signed)
Routed message to PCP. Salvatore Marvel, CMA

## 2023-01-27 NOTE — Assessment & Plan Note (Signed)
Established problem. Stable. Patient is at goal of few attacks, all managed at home.  Continue current abortive regiment: oxycodone 5 mg by mouth, Phenergan by mouth or per rectum, and Lorazepam 1 mg.  May repeat one time, if no relief of nausea & vomiting, then patient should seek rescue theray at ED, including IV hydration and IV abortive regiment.

## 2023-01-27 NOTE — Telephone Encounter (Signed)
Patient stopped in to give name of medication that she was given by another provider not at this practice  Name of cream is Hydroquinone 4% 28.35gm .  Any questions call patient.

## 2023-01-31 LAB — BASIC METABOLIC PANEL
BUN: 46 — AB (ref 4–21)
CO2: 21 (ref 13–22)
Chloride: 111 — AB (ref 99–108)
Creatinine: 3.5 — AB (ref 0.5–1.1)
Glucose: 88
Potassium: 5.5 mEq/L — AB (ref 3.5–5.1)
Sodium: 140 (ref 137–147)

## 2023-01-31 LAB — COMPREHENSIVE METABOLIC PANEL
Albumin: 4 (ref 3.5–5.0)
Calcium: 9.2 (ref 8.7–10.7)
eGFR: 15

## 2023-01-31 LAB — CBC AND DIFFERENTIAL: Hemoglobin: 9.5 — AB (ref 12.0–16.0)

## 2023-02-04 ENCOUNTER — Other Ambulatory Visit: Payer: Self-pay

## 2023-02-04 DIAGNOSIS — R1115 Cyclical vomiting syndrome unrelated to migraine: Secondary | ICD-10-CM

## 2023-02-04 DIAGNOSIS — E1143 Type 2 diabetes mellitus with diabetic autonomic (poly)neuropathy: Secondary | ICD-10-CM

## 2023-02-07 MED ORDER — LORAZEPAM 1 MG PO TABS: 1.0000 mg | ORAL_TABLET | Freq: Two times a day (BID) | ORAL | 0 refills | Status: DC | PRN

## 2023-02-07 MED ORDER — OXYCODONE HCL 5 MG PO TABS: 5.0000 mg | ORAL_TABLET | ORAL | 0 refills | Status: AC | PRN

## 2023-02-18 ENCOUNTER — Other Ambulatory Visit (HOSPITAL_COMMUNITY): Payer: Self-pay

## 2023-02-28 ENCOUNTER — Telehealth: Payer: Self-pay | Admitting: Pharmacist

## 2023-02-28 NOTE — Telephone Encounter (Signed)
Started Repatha in December, follow up lipid lab is due. Call patient to remind.

## 2023-03-04 ENCOUNTER — Other Ambulatory Visit: Payer: Self-pay

## 2023-03-04 DIAGNOSIS — E1169 Type 2 diabetes mellitus with other specified complication: Secondary | ICD-10-CM

## 2023-03-05 LAB — LIPID PANEL
Chol/HDL Ratio: 1.7 ratio (ref 0.0–4.4)
Cholesterol, Total: 117 mg/dL (ref 100–199)
HDL: 69 mg/dL (ref >39)
LDL Chol Calc (NIH): 36 mg/dL (ref 0–99)
Triglycerides: 49 mg/dL (ref 0–149)
VLDL Cholesterol Cal: 12 mg/dL (ref 5–40)

## 2023-03-07 ENCOUNTER — Encounter: Payer: Self-pay | Admitting: Cardiovascular Disease

## 2023-03-10 ENCOUNTER — Encounter: Payer: Self-pay | Admitting: Family Medicine

## 2023-03-10 ENCOUNTER — Ambulatory Visit (INDEPENDENT_AMBULATORY_CARE_PROVIDER_SITE_OTHER): Payer: 59 | Admitting: Family Medicine

## 2023-03-10 VITALS — BP 138/65 | HR 72 | Ht 61.0 in | Wt 160.0 lb

## 2023-03-10 DIAGNOSIS — E1159 Type 2 diabetes mellitus with other circulatory complications: Secondary | ICD-10-CM

## 2023-03-10 DIAGNOSIS — D369 Benign neoplasm, unspecified site: Secondary | ICD-10-CM

## 2023-03-10 DIAGNOSIS — E611 Iron deficiency: Secondary | ICD-10-CM

## 2023-03-10 DIAGNOSIS — E1122 Type 2 diabetes mellitus with diabetic chronic kidney disease: Secondary | ICD-10-CM | POA: Diagnosis not present

## 2023-03-10 DIAGNOSIS — I152 Hypertension secondary to endocrine disorders: Secondary | ICD-10-CM

## 2023-03-10 DIAGNOSIS — R1115 Cyclical vomiting syndrome unrelated to migraine: Secondary | ICD-10-CM

## 2023-03-10 DIAGNOSIS — Z794 Long term (current) use of insulin: Secondary | ICD-10-CM

## 2023-03-10 DIAGNOSIS — K3184 Gastroparesis: Secondary | ICD-10-CM

## 2023-03-10 DIAGNOSIS — N184 Chronic kidney disease, stage 4 (severe): Secondary | ICD-10-CM

## 2023-03-10 DIAGNOSIS — E1143 Type 2 diabetes mellitus with diabetic autonomic (poly)neuropathy: Secondary | ICD-10-CM

## 2023-03-10 MED ORDER — FERROUS SULFATE 324 (65 FE) MG PO TBEC: 1.0000 | DELAYED_RELEASE_TABLET | ORAL | 99 refills | Status: DC

## 2023-03-10 MED ORDER — LORAZEPAM 1 MG PO TABS: 1.0000 mg | ORAL_TABLET | Freq: Two times a day (BID) | ORAL | 0 refills | Status: DC | PRN

## 2023-03-10 MED ORDER — OXYCODONE HCL 5 MG PO CAPS: 5.0000 mg | ORAL_CAPSULE | ORAL | 0 refills | Status: DC | PRN

## 2023-03-10 NOTE — Patient Instructions (Signed)
Please restart your carvedilol at 12.5 mg each morning to lower your blood pressure just slightly.    Lorazepam and Oxycodone were sent to pharmacy.  Ferrous Sulfate (Iron) take one tablet, on an empty stomach, if your can every other day  If it psets your stomach, take the tablet with food.    We sent a referral to Dr Blanch Media office to contact you to schedule your next colonoscopy.  It has been 3 years since your last one.

## 2023-03-11 ENCOUNTER — Encounter: Payer: Self-pay | Admitting: Family Medicine

## 2023-03-11 NOTE — Assessment & Plan Note (Signed)
Established problem Uncontrolled.  Patient is not at goal of <130/80.  Patient with Chronic Kidney Disease IV. Start: Carvedilol 12.5 mg every mornin Continue: Hydralazine 25 mg three times a day, Diltiazem CD 180 mg twice a day  RTC 4-6 weeks for recheck

## 2023-03-11 NOTE — Assessment & Plan Note (Addendum)
Established problem Relatively stable.  She has had tow exacerbations she was able to self-manage at home with her abortive therapies.  No complaint today.  PDMP reviewed.  No red flags. Refilled Lorazepam and oxycodone   Patient may call for extra lorazepam and oxycodone to take on her upcoming [place] to her son[place] 05/02/23.

## 2023-03-11 NOTE — Assessment & Plan Note (Signed)
ROI fax'd to [facility] for recent office visit notes and labs.

## 2023-03-11 NOTE — Progress Notes (Signed)
[name] [name] is alone Sources of clinical information for visit is/are patient. Nursing assessment for this office visit was reviewed with the patient for accuracy and revision.     Previous Report(s) Reviewed: none     01/27/2023   10:50 AM  Depression screen PHQ 2/9  Decreased Interest 0  Down, Depressed, Hopeless 0  PHQ - 2 Score 0  Altered sleeping 0  Tired, decreased energy 0  Change in appetite 0  Feeling bad or failure about yourself  0  Trouble concentrating 0  Moving slowly or fidgety/restless 0  Suicidal thoughts 0  PHQ-9 Score 0   Flowsheet Row Office Visit from 01/27/2023 in [facility] Office Visit from 09/09/2022 in [facility] Office Visit from 04/15/2022 in [facility]  Thoughts that you would be better off dead, or of hurting yourself in some way Not at all Not at all Not at all  PHQ-9 Total Score 0 0 9          09/16/2021    3:01 PM 06/05/2020   10:31 AM 11/16/2018    2:50 PM 07/28/2018    8:51 AM 01/05/2018    8:59 AM  [place] in the past year? 0 1 0 No No  Number falls in past yr: 0 0 0    Injury with Fall? 0 1 0         01/27/2023   10:50 AM 09/09/2022    9:53 AM 04/15/2022    2:57 PM  PHQ9 SCORE ONLY  PHQ-9 Total Score 0 0 9    There are no preventive care reminders to display for this patient.  Health Maintenance Due  Topic Date Due   Zoster Vaccines- Shingrix (1 of 2) Never done   Diabetic kidney evaluation - Urine ACR  09/27/2014   Medicare Annual Wellness (AWV)  09/28/2018   FOOT EXAM  07/10/2021   COLONOSCOPY (Pts 45-26yrs Insurance coverage will need to be confirmed)  11/19/2022      History/P.E. limitations: none  There are no preventive care reminders to display for this patient.  Diabetes Health Maintenance Due  Topic Date Due   FOOT EXAM  07/10/2021   HEMOGLOBIN A1C  07/28/2023   OPHTHALMOLOGY EXAM  12/11/2023    Health Maintenance Due  Topic Date  Due   Zoster Vaccines- Shingrix (1 of 2) Never done   Diabetic kidney evaluation - Urine ACR  09/27/2014   Medicare Annual Wellness (AWV)  09/28/2018   FOOT EXAM  07/10/2021   COLONOSCOPY (Pts 45-72yrs Insurance coverage will need to be confirmed)  11/19/2022     Chief Complaint  Patient presents with   Hypertension     --------------------------------------------------------------------------------------------------------------------------------------------- Visit Problem List with A/P  Cyclic vomiting syndrome Established problem Relatively stable.  She has had tow exacerbations she was able to self-manage at home with her abortive therapies.  No complaint today.  PDMP reviewed.  No red flags. Refilled Lorazepam and oxycodone   Patient may call for extra lorazepam and oxycodone to take on her upcoming [place] to her [place] 05/02/23.    Hypertension associated with diabetes ([place]) Established problem Uncontrolled.  Patient is not at goal of <130/80.  Patient with Chronic Kidney Disease IV. Start: Carvedilol 12.5 mg every mornin Continue: Hydralazine 25 mg three times a day, Diltiazem CD 180 mg twice a day  RTC 4-6 weeks for recheck    Chronic kidney disease (  CKD) stage G4/A2, severely decreased glomerular filtration rate (GFR) between 15-29 mL/min/1.73 square meter and albuminuria creatinine ratio between 30-299 mg/g ([place]) ROI fax'd to [facility] for recent office visit notes and labs.

## 2023-03-23 ENCOUNTER — Encounter: Payer: Self-pay | Admitting: Family Medicine

## 2023-03-23 LAB — PHOSPHORUS: Phosphorus, Ser: 3.5

## 2023-03-25 ENCOUNTER — Other Ambulatory Visit: Payer: Self-pay | Admitting: Family Medicine

## 2023-03-25 DIAGNOSIS — E118 Type 2 diabetes mellitus with unspecified complications: Secondary | ICD-10-CM

## 2023-03-25 DIAGNOSIS — E1142 Type 2 diabetes mellitus with diabetic polyneuropathy: Secondary | ICD-10-CM

## 2023-03-25 DIAGNOSIS — R1115 Cyclical vomiting syndrome unrelated to migraine: Secondary | ICD-10-CM

## 2023-03-25 DIAGNOSIS — E1143 Type 2 diabetes mellitus with diabetic autonomic (poly)neuropathy: Secondary | ICD-10-CM

## 2023-04-08 ENCOUNTER — Other Ambulatory Visit: Payer: Self-pay

## 2023-04-08 DIAGNOSIS — R1115 Cyclical vomiting syndrome unrelated to migraine: Secondary | ICD-10-CM

## 2023-04-08 DIAGNOSIS — E1143 Type 2 diabetes mellitus with diabetic autonomic (poly)neuropathy: Secondary | ICD-10-CM

## 2023-04-11 ENCOUNTER — Other Ambulatory Visit: Payer: Self-pay | Admitting: Family Medicine

## 2023-04-11 DIAGNOSIS — L989 Disorder of the skin and subcutaneous tissue, unspecified: Secondary | ICD-10-CM

## 2023-04-11 MED ORDER — LORAZEPAM 1 MG PO TABS: 1.0000 mg | ORAL_TABLET | Freq: Two times a day (BID) | ORAL | 0 refills | Status: DC | PRN

## 2023-04-11 MED ORDER — OXYCODONE HCL 5 MG PO CAPS: 5.0000 mg | ORAL_CAPSULE | ORAL | 0 refills | Status: DC | PRN

## 2023-05-10 ENCOUNTER — Other Ambulatory Visit: Payer: Self-pay

## 2023-05-10 DIAGNOSIS — E1143 Type 2 diabetes mellitus with diabetic autonomic (poly)neuropathy: Secondary | ICD-10-CM

## 2023-05-10 DIAGNOSIS — R1115 Cyclical vomiting syndrome unrelated to migraine: Secondary | ICD-10-CM

## 2023-05-11 MED ORDER — OXYCODONE HCL 5 MG PO CAPS
5.0000 mg | ORAL_CAPSULE | ORAL | 0 refills | Status: DC | PRN
Start: 2023-05-11 — End: 2023-06-10

## 2023-05-11 MED ORDER — LORAZEPAM 1 MG PO TABS: 1.0000 mg | ORAL_TABLET | Freq: Two times a day (BID) | ORAL | 0 refills | Status: DC | PRN

## 2023-06-09 ENCOUNTER — Other Ambulatory Visit: Payer: Self-pay

## 2023-06-09 DIAGNOSIS — R1115 Cyclical vomiting syndrome unrelated to migraine: Secondary | ICD-10-CM

## 2023-06-09 DIAGNOSIS — E1143 Type 2 diabetes mellitus with diabetic autonomic (poly)neuropathy: Secondary | ICD-10-CM

## 2023-06-09 NOTE — Telephone Encounter (Signed)
Patient calls nurse line requesting refills on oxycodone 5 mg and lorazepam 1 mg. Lorazepam pended to this encounter.   She is asking if oxycodone can be prescribed as tablets instead of capsules, as capsules are not covered by insurance.  She also reports that she will be in town tomorrow for appointment with kidney specialist. She will not be able to come back into [facility] next week.   She is requesting that she be able to pick these prescriptions up tomorrow.   Please advise.   Veronda Prude, RN

## 2023-06-10 ENCOUNTER — Other Ambulatory Visit: Payer: Self-pay | Admitting: Family Medicine

## 2023-06-10 DIAGNOSIS — E1143 Type 2 diabetes mellitus with diabetic autonomic (poly)neuropathy: Secondary | ICD-10-CM

## 2023-06-10 DIAGNOSIS — Z794 Long term (current) use of insulin: Secondary | ICD-10-CM

## 2023-06-10 DIAGNOSIS — E11311 Type 2 diabetes mellitus with unspecified diabetic retinopathy with macular edema: Secondary | ICD-10-CM

## 2023-06-10 MED ORDER — OXYCODONE HCL 5 MG PO TABS
5.0000 mg | ORAL_TABLET | Freq: Four times a day (QID) | ORAL | 0 refills | Status: DC | PRN
Start: 2023-06-10 — End: 2023-07-11

## 2023-06-10 MED ORDER — LORAZEPAM 1 MG PO TABS: 1.0000 mg | ORAL_TABLET | Freq: Two times a day (BID) | ORAL | 0 refills | Status: DC | PRN

## 2023-06-14 ENCOUNTER — Ambulatory Visit (INDEPENDENT_AMBULATORY_CARE_PROVIDER_SITE_OTHER): Payer: 59 | Admitting: *Deleted

## 2023-06-14 DIAGNOSIS — Z0000 Encounter for general adult medical examination without abnormal findings: Secondary | ICD-10-CM | POA: Diagnosis not present

## 2023-06-14 NOTE — Progress Notes (Signed)
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 hospital setting.  She stated she was scheduled for one went through the prep but then got very ill and couldn't do it. She is due but states she does not want to schedule at [facility] office.    Please advise patient.

## 2023-06-14 NOTE — Patient Instructions (Signed)
[name] [name] , Thank you for taking time to come for your Medicare Wellness Visit. I appreciate your ongoing commitment to your health goals. Please review the following plan we discussed and let me know if I can assist you in the future.   Screening recommendations/referrals: Colonoscopy: Education provided Mammogram: Education provided  Recommended yearly ophthalmology/optometry visit for glaucoma screening and checkup Recommended yearly dental visit for hygiene and checkup  Vaccinations: Influenza vaccine:  up to date Tdap vaccine: up to date Shingles vaccine: Education provided    Advanced directives: Education provided      Preventive Care 65 Years and Older, Female Preventive care refers to lifestyle choices and visits with your health care provider that can promote health and wellness. What does preventive care include? A yearly physical exam. This is also called an annual well check. Dental exams once or twice a year. Routine eye exams. Ask your health care provider how often you should have your eyes checked. Personal lifestyle choices, including: Daily care of your teeth and gums. Regular physical activity. Eating a healthy diet. Avoiding tobacco and drug use. Limiting alcohol use. Practicing safe sex. Taking low-dose aspirin every day. Taking vitamin and mineral supplements as recommended by your health care provider. What happens during an annual well check? The services and screenings done by your health care provider during your annual well check will depend on your age, overall health, lifestyle risk factors, and family history of disease. Counseling  Your health care provider may ask you questions about your: Alcohol use. Tobacco use. Drug use. Emotional well-being. Home and relationship well-being. Sexual activity. Eating habits. History of falls. Memory and ability to understand (cognition). Work and work Astronomer. Reproductive health. Screening   You may have the following tests or measurements: Height, weight, and BMI. Blood pressure. Lipid and cholesterol levels. These may be checked every 5 years, or more frequently if you are over 97 years old. Skin check. Lung cancer screening. You may have this screening every year starting at age 80 if you have a 30-pack-year history of smoking and currently smoke or have quit within the past 15 years. Fecal occult blood test (FOBT) of the stool. You may have this test every year starting at age 4. Flexible sigmoidoscopy or colonoscopy. You may have a sigmoidoscopy every 5 years or a colonoscopy every 10 years starting at age 6. Hepatitis C blood test. Hepatitis B blood test. Sexually transmitted disease (STD) testing. Diabetes screening. This is done by checking your blood sugar (glucose) after you have not eaten for a while (fasting). You may have this done every 1-3 years. Bone density scan. This is done to screen for osteoporosis. You may have this done starting at age 79. Mammogram. This may be done every 1-2 years. Talk to your health care provider about how often you should have regular mammograms. Talk with your health care provider about your test results, treatment options, and if necessary, the need for more tests. Vaccines  Your health care provider may recommend certain vaccines, such as: Influenza vaccine. This is recommended every year. Tetanus, diphtheria, and acellular pertussis (Tdap, Td) vaccine. You may need a Td booster every 10 years. Zoster vaccine. You may need this after age 54. Pneumococcal 13-valent conjugate (PCV13) vaccine. One dose is recommended after age 59. Pneumococcal polysaccharide (PPSV23) vaccine. One dose is recommended after age 64. Talk to your health care provider about which screenings and vaccines you need and how often you need them. This information is not intended  to replace advice given to you by your health care provider. Make sure you discuss  any questions you have with your health care provider. Document Released: 01/09/2016 Document Revised: 09/01/2016 Document Reviewed: 10/14/2015 Elsevier Interactive Patient Education  2017 [place] Prevention in the Home Falls can cause injuries. They can happen to people of all ages. There are many things you can do to make your home safe and to help prevent falls. What can I do on the outside of my home? Regularly fix the edges of walkways and driveways and fix any cracks. Remove anything that might make you trip as you walk through a door, such as a raised step or threshold. Trim any bushes or trees on the path to your home. Use bright outdoor lighting. Clear any walking paths of anything that might make someone trip, such as rocks or tools. Regularly check to see if handrails are loose or broken. Make sure that both sides of any steps have handrails. Any raised decks and porches should have guardrails on the edges. Have any leaves, snow, or ice cleared regularly. Use sand or salt on walking paths during winter. Clean up any spills in your garage right away. This includes oil or grease spills. What can I do in the bathroom? Use night lights. Install grab bars by the toilet and in the tub and shower. Do not use towel bars as grab bars. Use non-skid mats or decals in the tub or shower. If you need to sit down in the shower, use a plastic, non-slip stool. Keep the floor dry. Clean up any water that spills on the floor as soon as it happens. Remove soap buildup in the tub or shower regularly. Attach bath mats securely with double-sided non-slip rug tape. Do not have throw rugs and other things on the floor that can make you trip. What can I do in the bedroom? Use night lights. Make sure that you have a light by your bed that is easy to reach. Do not use any sheets or blankets that are too big for your bed. They should not hang down onto the floor. Have a firm chair that has  side arms. You can use this for support while you get dressed. Do not have throw rugs and other things on the floor that can make you trip. What can I do in the kitchen? Clean up any spills right away. Avoid walking on wet floors. Keep items that you use a lot in easy-to-reach places. If you need to reach something above you, use a strong step stool that has a grab bar. Keep electrical cords out of the way. Do not use floor polish or wax that makes floors slippery. If you must use wax, use non-skid floor wax. Do not have throw rugs and other things on the floor that can make you trip. What can I do with my stairs? Do not leave any items on the stairs. Make sure that there are handrails on both sides of the stairs and use them. Fix handrails that are broken or loose. Make sure that handrails are as long as the stairways. Check any carpeting to make sure that it is firmly attached to the stairs. Fix any carpet that is loose or worn. Avoid having throw rugs at the top or bottom of the stairs. If you do have throw rugs, attach them to the floor with carpet tape. Make sure that you have a light switch at the top of the stairs and  the bottom of the stairs. If you do not have them, ask someone to add them for you. What else can I do to help prevent falls? Wear shoes that: Do not have high heels. Have rubber bottoms. Are comfortable and fit you well. Are closed at the toe. Do not wear sandals. If you use a stepladder: Make sure that it is fully opened. Do not climb a closed stepladder. Make sure that both sides of the stepladder are locked into place. Ask someone to hold it for you, if possible. Clearly mark and make sure that you can see: Any grab bars or handrails. First and last steps. Where the edge of each step is. Use tools that help you move around (mobility aids) if they are needed. These include: Canes. Walkers. Scooters. Crutches. Turn on the lights when you go into a dark area.  Replace any light bulbs as soon as they burn out. Set up your furniture so you have a clear path. Avoid moving your furniture around. If any of your floors are uneven, fix them. If there are any pets around you, be aware of where they are. Review your medicines with your doctor. Some medicines can make you feel dizzy. This can increase your chance of falling. Ask your doctor what other things that you can do to help prevent falls. This information is not intended to replace advice given to you by your health care provider. Make sure you discuss any questions you have with your health care provider. Document Released: 10/09/2009 Document Revised: 05/20/2016 Document Reviewed: 01/17/2015 Elsevier Interactive Patient Education  2017 [employer].

## 2023-06-23 ENCOUNTER — Ambulatory Visit (INDEPENDENT_AMBULATORY_CARE_PROVIDER_SITE_OTHER): Payer: 59 | Admitting: Family Medicine

## 2023-06-23 ENCOUNTER — Encounter: Payer: Self-pay | Admitting: Family Medicine

## 2023-06-23 VITALS — BP 120/60 | HR 75 | Ht 61.0 in | Wt 154.0 lb

## 2023-06-23 DIAGNOSIS — E113293 Type 2 diabetes mellitus with mild nonproliferative diabetic retinopathy without macular edema, bilateral: Secondary | ICD-10-CM | POA: Diagnosis not present

## 2023-06-23 DIAGNOSIS — Z794 Long term (current) use of insulin: Secondary | ICD-10-CM | POA: Diagnosis not present

## 2023-06-23 DIAGNOSIS — N184 Chronic kidney disease, stage 4 (severe): Secondary | ICD-10-CM | POA: Diagnosis not present

## 2023-06-23 DIAGNOSIS — R0789 Other chest pain: Secondary | ICD-10-CM

## 2023-06-23 DIAGNOSIS — I152 Hypertension secondary to endocrine disorders: Secondary | ICD-10-CM

## 2023-06-23 DIAGNOSIS — D126 Benign neoplasm of colon, unspecified: Secondary | ICD-10-CM

## 2023-06-23 DIAGNOSIS — Z720 Tobacco use: Secondary | ICD-10-CM

## 2023-06-23 DIAGNOSIS — E1122 Type 2 diabetes mellitus with diabetic chronic kidney disease: Secondary | ICD-10-CM

## 2023-06-23 DIAGNOSIS — E1159 Type 2 diabetes mellitus with other circulatory complications: Secondary | ICD-10-CM

## 2023-06-23 LAB — POCT GLYCOSYLATED HEMOGLOBIN (HGB A1C): HbA1c, POC (controlled diabetic range): 5.6 % (ref 0.0–7.0)

## 2023-06-23 NOTE — Patient Instructions (Addendum)
Your Blood sugar is A1c 5.6%.  You may need to start cutting down on your insulin daily dose.   Your Blood pressure is outstanding. Keep taking your hydralazine and Amlodipine.    We will make referral to your cardiologists to recheck your heart.  We will send a message to your gastroenterologist about your unique situation needs for colonoscopy.  See Dr Raymondo Band to discuss Continuous Glucose Monitor.

## 2023-06-24 ENCOUNTER — Encounter: Payer: Self-pay | Admitting: Family Medicine

## 2023-06-24 NOTE — Assessment & Plan Note (Signed)
  Lab Results  Component Value Date    HGBA1C 5.6 06/23/2023    Patient taking Glargine 8 units [name] daily and Linagliptin 5 mg daily No hypoglycemic events   Plan Discussed decreasing insulin dose - [name] [name] is hesitant to do this.  Should she develop hypoglycemia, she will have to decrease the dose. I suggested decreasing to 5 units daily.  

## 2023-06-24 NOTE — Assessment & Plan Note (Signed)
Lab Results  Component Value Date   HGBA1C 5.6 06/23/2023   Patient taking Glargine 8 units [place] daily and Linagliptin 5 mg daily No hypoglycemic events  Plan Discussed decreasing insulin dose - [name] [name] is hesitant to do this.  Should she develop hypoglycemia, she will have to decrease the dose. I suggested decreasing to 5 units daily.

## 2023-06-24 NOTE — Assessment & Plan Note (Signed)
  Lab Results  Component Value Date    HGBA1C 5.6 06/23/2023    Patient taking Glargine 8 units [name] daily and Linagliptin 5 mg daily No hypoglycemic events   Plan Discussed decreasing insulin dose - [name] [name] is hesitant to do this.  Should she develop hypoglycemia, she will have to decrease the dose. I suggested decreasing to 5 units daily.

## 2023-06-24 NOTE — Assessment & Plan Note (Signed)
Established problem Well Controlled. Patient is at goal of <130/80. No signs of complications, medication side effects, or red flags. Continue Diltiazem 180 mg daily and hydralazine 25 mg TID

## 2023-06-24 NOTE — Assessment & Plan Note (Signed)
[name] [name] is interested it quitting smoking. She will see Dr Raymondo Band soon about a CGM device.   I recommended she ask Dr Raymondo Band about smoking cessation at this visit.

## 2023-06-24 NOTE — Assessment & Plan Note (Addendum)
Recurrent problem for patient  Onset over last several weeks of epigastric to SS chest discomfort, burning and sharp snesation Occurs at rest Occasionally associated with nausea/vomiting. Last 5 to 10 minutes Does not take anything for it.    No pain currently  She had cardiac catheterization performed 10-15 years ago that showed normal coronary arteries.  Dr Allyson Sabal ([facility]) saw [name] for atypical Chest Pain in 09/2016.  He has been helping [name] with reduction of her arteriosclerosis risk factors. Patient is on Repatha.  PMH significant for PAD, Chronic Kidney Disease 4, DMT2, hyperlipidemia, Tobacco, Hypertension  Physical Afeb VSS Gen: no acute disease Lung: BCTA Cor: RRR, no murmur Abdomin: mild epigastric tenderness, no DT, no mass, (+) BS Ext: no edema  EKG 09/02/22 NSR with rS pattern V1 & V2 precordial leads  A/ Atypical Chest Pain in patient at intermediate risk of CAD Patient with history of gastoparesis and cyclic vomiting syndorme associated with GERD complicates the patient's situation.  P/ Referral back to [facility] for evaluation Discussed continuing ASA daily, and going to ED for CP that does not resolve within 15 minutes.

## 2023-06-24 NOTE — Progress Notes (Signed)
[name] [name] is alone Sources of clinical information for visit is/are patient. Nursing assessment for this office visit was reviewed with the patient for accuracy and revision.     Previous Report(s) Reviewed: Nephrology office visit recent nmotes     06/23/2023    9:23 AM  Depression screen PHQ 2/9  Decreased Interest 0  Down, Depressed, Hopeless 0  PHQ - 2 Score 0  Altered sleeping 0  Tired, decreased energy 1  Change in appetite 3  Feeling bad or failure about yourself  0  Trouble concentrating 0  Moving slowly or fidgety/restless 0  Suicidal thoughts 0  PHQ-9 Score 4   Flowsheet Row Office Visit from 06/23/2023 in [facility] Clinical Support from 06/14/2023 in [facility] Office Visit from 01/27/2023 in [facility]  Thoughts that you would be better off dead, or of hurting yourself in some way Not at all Not at all Not at all  PHQ-9 Total Score 4 0 0          06/14/2023    3:15 PM 09/16/2021    3:01 PM 06/05/2020   10:31 AM 11/16/2018    2:50 PM 07/28/2018    8:51 AM  Fall Risk   Falls in the past year? 0 0 1 0 No  Number falls in past yr: 0 0 0 0   Injury with Fall? 0 0 1 0   Follow up Falls evaluation completed;Education provided;Falls prevention discussed           06/23/2023    9:23 AM 06/14/2023    3:21 PM 01/27/2023   10:50 AM  PHQ9 SCORE ONLY  PHQ-9 Total Score 4 0 0    There are no preventive care reminders to display for this patient.  Health Maintenance Due  Topic Date Due   Diabetic kidney evaluation - Urine ACR  09/27/2014   FOOT EXAM  07/10/2021   Colonoscopy  11/19/2022      History/P.E. limitations: none  There are no preventive care reminders to display for this patient.  Diabetes Health Maintenance Due  Topic Date Due   FOOT EXAM  07/10/2021   OPHTHALMOLOGY EXAM  12/11/2023   HEMOGLOBIN A1C  12/23/2023    Health Maintenance Due  Topic Date Due   Diabetic kidney  evaluation - Urine ACR  09/27/2014   FOOT EXAM  07/10/2021   Colonoscopy  11/19/2022     Chief Complaint  Patient presents with   Diabetes check     --------------------------------------------------------------------------------------------------------------------------------------------- Visit Problem List with A/P  No problem-specific Assessment & Plan notes found for this encounter.

## 2023-06-29 ENCOUNTER — Ambulatory Visit: Payer: 59 | Admitting: Pharmacist

## 2023-07-07 ENCOUNTER — Ambulatory Visit: Payer: 59 | Attending: Nurse Practitioner | Admitting: Nurse Practitioner

## 2023-07-07 ENCOUNTER — Encounter: Payer: Self-pay | Admitting: Nurse Practitioner

## 2023-07-07 VITALS — BP 124/62 | HR 57 | Ht 61.5 in | Wt 156.0 lb

## 2023-07-07 DIAGNOSIS — I10 Essential (primary) hypertension: Secondary | ICD-10-CM

## 2023-07-07 DIAGNOSIS — I739 Peripheral vascular disease, unspecified: Secondary | ICD-10-CM

## 2023-07-07 DIAGNOSIS — R002 Palpitations: Secondary | ICD-10-CM | POA: Diagnosis not present

## 2023-07-07 DIAGNOSIS — E785 Hyperlipidemia, unspecified: Secondary | ICD-10-CM

## 2023-07-07 DIAGNOSIS — R0789 Other chest pain: Secondary | ICD-10-CM

## 2023-07-07 DIAGNOSIS — Z794 Long term (current) use of insulin: Secondary | ICD-10-CM

## 2023-07-07 DIAGNOSIS — Z720 Tobacco use: Secondary | ICD-10-CM

## 2023-07-07 DIAGNOSIS — E118 Type 2 diabetes mellitus with unspecified complications: Secondary | ICD-10-CM

## 2023-07-07 DIAGNOSIS — N184 Chronic kidney disease, stage 4 (severe): Secondary | ICD-10-CM

## 2023-07-07 NOTE — Patient Instructions (Signed)
Medication Instructions:  Your physician recommends that you continue on your current medications as directed. Please refer to the Current Medication list given to you today.  *If you need a refill on your cardiac medications before your next appointment, please call your pharmacy*   Lab Work: NONE ordered at this time of appointment    Testing/Procedures: NONE ordered at this time of appointment     Follow-Up: At [facility], you and your health needs are our priority.  As part of our continuing mission to provide you with exceptional heart care, we have created designated Provider Care Teams.  These Care Teams include your primary Cardiologist (physician) and Advanced Practice Providers (APPs -  Physician Assistants and Nurse Practitioners) who all work together to provide you with the care you need, when you need it.  We recommend signing up for the patient portal called "MyChart".  Sign up information is provided on this After Visit Summary.  MyChart is used to connect with patients for Virtual Visits (Telemedicine).  Patients are able to view lab/test results, encounter notes, upcoming appointments, etc.  Non-urgent messages can be sent to your provider as well.   To learn more about what you can do with MyChart, go to [email or web address].    Your next appointment:   6 month(s)  Provider:   Nanetta Batty, MD

## 2023-07-07 NOTE — Progress Notes (Signed)
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 Take 1 tablet (4  mg total) by mouth every 8 (eight) hours as needed for nausea or vomiting. (Patient not taking: Reported on 07/07/2023) 30 tablet 1   ONETOUCH VERIO test strip 3 (three) times daily. (Patient not taking: Reported on 07/07/2023)     promethazine (PHENERGAN) 25 MG tablet Take 1 tablet (25 mg total) by mouth every 6 (six) hours as needed for nausea or vomiting. (Patient not taking: Reported on 07/07/2023) 30 tablet PRN   QUEtiapine (SEROQUEL) 50 MG tablet TAKE 1 TABLET(50 MG) BY MOUTH AT BEDTIME (Patient not taking: Reported on 07/07/2023) 90 tablet 1   tacrolimus (PROTOPIC) 0.1 % ointment Apply topically. (Patient not taking: Reported on 07/07/2023)     No current facility-administered medications for this visit.     Review of Systems    She denies chest pain, dyspnea, pnd, orthopnea, n, v, dizziness, syncope, edema, weight gain, or early satiety. All other  systems reviewed and are otherwise negative except as noted above.   Physical Exam    VS:  BP 124/62 (BP Location: Left Arm, Patient Position: Sitting, Cuff Size: Normal)   Pulse (!) 57   Ht 5' 1.5" (1.562 m)   Wt 156 lb (70.8 kg)   SpO2 99%   BMI 29.00 kg/m  GEN: Well nourished, well developed, in no acute distress. HEENT: normal. Neck: Supple, no JVD, carotid bruits, or masses. Cardiac: RRR, no murmurs, rubs, or gallops. No clubbing, cyanosis, edema.  Radials/DP/PT 2+ and equal bilaterally.  Respiratory:  Respirations regular and unlabored, clear to auscultation bilaterally. GI: Soft, nontender, nondistended, BS + x 4. MS: no deformity or atrophy. Skin: warm and dry, no rash. Neuro:  Strength and sensation are intact. Psych: Normal affect.  Accessory Clinical Findings    ECG personally reviewed by me today - EKG Interpretation Date/Time:  Thursday July 07 2023 11:07:23 EDT Ventricular Rate:  57 PR Interval:  164 QRS Duration:  74 QT Interval:  412 QTC Calculation: 401 R Axis:   33  Text Interpretation: Sinus bradycardia Possible Left atrial enlargement Septal infarct (cited on or before 03-Sep-2021) When compared with ECG of 31-Aug-2022 17:37, No significant change was found Confirmed by Bernadene Person ([id number]) on 07/07/2023 11:17:07 AM  - no acute changes.   Lab Results  Component Value Date   WBC 4.9 09/03/2022   HGB 9.5 (A) 01/31/2023   HCT 30.2 (L) 09/03/2022   MCV 93.5 09/03/2022   PLT 198 09/03/2022   Lab Results  Component Value Date   CREATININE 3.5 (A) 01/31/2023   BUN 46 (A) 01/31/2023   NA 140 01/31/2023   K 5.5 (A) 01/31/2023   CL 111 (A) 01/31/2023   CO2 21 01/31/2023   Lab Results  Component Value Date   ALT 7 10/25/2022   AST 17 10/25/2022   ALKPHOS 101 10/25/2022   BILITOT <0.2 10/25/2022   Lab Results  Component Value Date   CHOL 117 03/04/2023   HDL 69 03/04/2023   LDLCALC 36 03/04/2023   LDLDIRECT 102 (H) 02/14/2012   TRIG 49  03/04/2023   CHOLHDL 1.7 03/04/2023    Lab Results  Component Value Date   HGBA1C 5.6 06/23/2023    Assessment & Plan    1. Palpitations: After she takes her hydralazine she notices brief episodes of palpitations which she describes as her heart "pounding" with increased activity.  Her symptoms last for less than a minute and resolve spontaneously.  She denies any associated symptoms.  I advised her to check her BP when symptoms  occur.  Additionally,I  advised her to notify us if she has increased frequency or duration of palpitations.  If symptoms become more frequent or persistent, could consider cardiac monitor.  Will defer for now.  Given multiple allergies to BP medications, CKD stage IV, would continue hydralazine for now if possible.  2. PAD: Lower extremity Dopplers revealed evidence of high-frequency signal in her proximal left SFA, ABI was 0.86 on the right, 0.65 on the left. The decision was made to defer PV intervention given type 2 diabetes, elevated serum creatinine and high risk of renal failure.  She denies any worsening claudication.  Overall stable.  Continue Repatha.  3. Atypical chest pain: Remote cardiac catheterization revealed normal coronary arteries.  Stable with no anginal symptoms. No indication for ischemic evaluation.   4. Hypertension: BP well controlled. Continue current antihypertensive regimen.   5. Hyperlipidemia: LDL was 36 in 02/2023. Continue Repatha.   6. Type 2 diabetes: A1c was 5.6 in 05/2023. Monitored and managed per PCP.   7. CKD stage IV: Creatinine was 2.8 in 05/2023. Follows with nephrology.   8. Tobacco use: She continues to smoke.  Full cessation advised.   9. Disposition: Follow-up in 6 months, sooner if needed.       Joylene Grapes, NP 07/07/2023, 11:48 AM

## 2023-07-11 ENCOUNTER — Telehealth: Payer: Self-pay | Admitting: Family Medicine

## 2023-07-11 DIAGNOSIS — M62838 Other muscle spasm: Secondary | ICD-10-CM

## 2023-07-11 DIAGNOSIS — R1115 Cyclical vomiting syndrome unrelated to migraine: Secondary | ICD-10-CM

## 2023-07-11 DIAGNOSIS — E1143 Type 2 diabetes mellitus with diabetic autonomic (poly)neuropathy: Secondary | ICD-10-CM

## 2023-07-11 MED ORDER — METHOCARBAMOL 500 MG PO TABS
500.0000 mg | ORAL_TABLET | Freq: Four times a day (QID) | ORAL | 2 refills | Status: DC
Start: 2023-07-11 — End: 2023-08-04

## 2023-07-11 MED ORDER — OXYCODONE HCL 5 MG PO TABS
5.0000 mg | ORAL_TABLET | Freq: Four times a day (QID) | ORAL | 0 refills | Status: DC | PRN
Start: 2023-07-11 — End: 2023-08-11

## 2023-07-11 MED ORDER — LORAZEPAM 1 MG PO TABS: 1.0000 mg | ORAL_TABLET | Freq: Two times a day (BID) | ORAL | 0 refills | Status: DC | PRN

## 2023-07-11 NOTE — Telephone Encounter (Signed)
Routed message to PCP. Tashira Leggette, CMA  

## 2023-07-11 NOTE — Addendum Note (Signed)
Addended byPerley Jain, Maelin Kurkowski D on: 07/11/2023 11:27 AM   Modules accepted: Orders

## 2023-07-11 NOTE — Telephone Encounter (Signed)
Patient called needing medication refilled. I sent her to the nurse line, she called back scheduling line crying for me to put a message back. She is needing her Oxycodone, Lorazepam, and muscle relaxer refilled she said she is having neck spasms again.   Please Advise.   Thanks!

## 2023-07-13 ENCOUNTER — Other Ambulatory Visit: Payer: Self-pay | Admitting: Family Medicine

## 2023-07-13 ENCOUNTER — Emergency Department (HOSPITAL_COMMUNITY)
Admission: EM | Admit: 2023-07-13 | Discharge: 2023-07-13 | Disposition: A | Discharge status: Home / Self Care | Payer: 59 | Attending: Emergency Medicine | Admitting: Emergency Medicine

## 2023-07-13 ENCOUNTER — Ambulatory Visit (INDEPENDENT_AMBULATORY_CARE_PROVIDER_SITE_OTHER): Payer: 59 | Admitting: Student

## 2023-07-13 ENCOUNTER — Encounter: Payer: Self-pay | Admitting: Student

## 2023-07-13 ENCOUNTER — Encounter (HOSPITAL_COMMUNITY): Payer: Self-pay | Admitting: Emergency Medicine

## 2023-07-13 ENCOUNTER — Emergency Department (HOSPITAL_COMMUNITY): Payer: 59

## 2023-07-13 ENCOUNTER — Other Ambulatory Visit: Payer: Self-pay

## 2023-07-13 VITALS — BP 160/74 | HR 100 | Ht 61.5 in | Wt 149.8 lb

## 2023-07-13 DIAGNOSIS — R1010 Upper abdominal pain, unspecified: Secondary | ICD-10-CM | POA: Diagnosis present

## 2023-07-13 DIAGNOSIS — Z794 Long term (current) use of insulin: Secondary | ICD-10-CM | POA: Diagnosis not present

## 2023-07-13 DIAGNOSIS — Z7984 Long term (current) use of oral hypoglycemic drugs: Secondary | ICD-10-CM | POA: Diagnosis not present

## 2023-07-13 DIAGNOSIS — I129 Hypertensive chronic kidney disease with stage 1 through stage 4 chronic kidney disease, or unspecified chronic kidney disease: Secondary | ICD-10-CM | POA: Insufficient documentation

## 2023-07-13 DIAGNOSIS — N189 Chronic kidney disease, unspecified: Secondary | ICD-10-CM | POA: Insufficient documentation

## 2023-07-13 DIAGNOSIS — R000 Tachycardia, unspecified: Secondary | ICD-10-CM | POA: Insufficient documentation

## 2023-07-13 DIAGNOSIS — R1013 Epigastric pain: Secondary | ICD-10-CM | POA: Diagnosis not present

## 2023-07-13 DIAGNOSIS — M542 Cervicalgia: Secondary | ICD-10-CM

## 2023-07-13 DIAGNOSIS — R748 Abnormal levels of other serum enzymes: Secondary | ICD-10-CM | POA: Insufficient documentation

## 2023-07-13 DIAGNOSIS — E1122 Type 2 diabetes mellitus with diabetic chronic kidney disease: Secondary | ICD-10-CM | POA: Diagnosis not present

## 2023-07-13 DIAGNOSIS — R1115 Cyclical vomiting syndrome unrelated to migraine: Secondary | ICD-10-CM

## 2023-07-13 DIAGNOSIS — R112 Nausea with vomiting, unspecified: Secondary | ICD-10-CM

## 2023-07-13 LAB — COMPREHENSIVE METABOLIC PANEL
ALT: 11 U/L (ref 0–44)
AST: 18 U/L (ref 15–41)
Albumin: 4.2 g/dL (ref 3.5–5.0)
Alkaline Phosphatase: 84 U/L (ref 38–126)
Anion gap: 13 (ref 5–15)
BUN: 36 mg/dL — ABNORMAL HIGH (ref 6–20)
CO2: 19 mmol/L — ABNORMAL LOW (ref 22–32)
Calcium: 9.7 mg/dL (ref 8.9–10.3)
Chloride: 106 mmol/L (ref 98–111)
Creatinine, Ser: 3.38 mg/dL — ABNORMAL HIGH (ref 0.44–1.00)
GFR, Estimated: 15 mL/min — ABNORMAL LOW (ref >60)
Glucose, Bld: 185 mg/dL — ABNORMAL HIGH (ref 70–99)
Potassium: 4.5 mmol/L (ref 3.5–5.1)
Sodium: 138 mmol/L (ref 135–145)
Total Bilirubin: 0.9 mg/dL (ref 0.3–1.2)
Total Protein: 7.7 g/dL (ref 6.5–8.1)

## 2023-07-13 LAB — LIPASE, BLOOD: Lipase: 115 U/L — ABNORMAL HIGH (ref 11–51)

## 2023-07-13 LAB — URINALYSIS, ROUTINE W REFLEX MICROSCOPIC
Bilirubin Urine: NEGATIVE
Glucose, UA: NEGATIVE mg/dL
Ketones, ur: NEGATIVE mg/dL
Leukocytes,Ua: NEGATIVE
Nitrite: NEGATIVE
Protein, ur: 100 mg/dL — AB
Specific Gravity, Urine: 1.012 (ref 1.005–1.030)
pH: 5 (ref 5.0–8.0)

## 2023-07-13 LAB — CBC
HCT: 35.8 % — ABNORMAL LOW (ref 36.0–46.0)
Hemoglobin: 11.6 g/dL — ABNORMAL LOW (ref 12.0–15.0)
MCH: 29.6 pg (ref 26.0–34.0)
MCHC: 32.4 g/dL (ref 30.0–36.0)
MCV: 91.3 fL (ref 80.0–100.0)
Platelets: 246 10*3/uL (ref 150–400)
RBC: 3.92 MIL/uL (ref 3.87–5.11)
RDW: 15.9 % — ABNORMAL HIGH (ref 11.5–15.5)
WBC: 7.5 10*3/uL (ref 4.0–10.5)
nRBC: 0 % (ref 0.0–0.2)

## 2023-07-13 LAB — TROPONIN I (HIGH SENSITIVITY)
Troponin I (High Sensitivity): 32 ng/L — ABNORMAL HIGH (ref <18)
Troponin I (High Sensitivity): 39 ng/L — ABNORMAL HIGH (ref <18)
Troponin I (High Sensitivity): 36 ng/L — ABNORMAL HIGH (ref <18)

## 2023-07-13 MED ORDER — METHOCARBAMOL 500 MG PO TABS: 500.0000 mg | ORAL_TABLET | Freq: Two times a day (BID) | ORAL | 0 refills | Status: DC

## 2023-07-13 MED ORDER — LIDOCAINE 5 % EX PTCH
1.0000 | MEDICATED_PATCH | CUTANEOUS | Status: DC
  Administered 2023-07-13: 1 via TRANSDERMAL
  Filled 2023-07-13: qty 1

## 2023-07-13 MED ORDER — DILTIAZEM HCL ER COATED BEADS 180 MG PO CP24
180.0000 mg | ORAL_CAPSULE | Freq: Once | ORAL | Status: AC
  Administered 2023-07-13: 180 mg via ORAL
  Filled 2023-07-13: qty 1

## 2023-07-13 MED ORDER — HYDROMORPHONE HCL 1 MG/ML IJ SOLN
1.0000 mg | Freq: Once | INTRAMUSCULAR | Status: AC
  Administered 2023-07-13: 1 mg via INTRAVENOUS
  Filled 2023-07-13: qty 1

## 2023-07-13 MED ORDER — SODIUM CHLORIDE 0.9 % IV BOLUS
1000.0000 mL | Freq: Once | INTRAVENOUS | Status: AC
  Administered 2023-07-13: 1000 mL via INTRAVENOUS

## 2023-07-13 MED ORDER — PROMETHAZINE HCL 25 MG PO TABS: 25.0000 mg | ORAL_TABLET | Freq: Four times a day (QID) | ORAL | 0 refills | Status: DC | PRN

## 2023-07-13 MED ORDER — PROMETHAZINE HCL 25 MG/ML IJ SOLN
25.0000 mg | Freq: Once | INTRAMUSCULAR | Status: AC
  Administered 2023-07-13: 25 mg via INTRAMUSCULAR
  Filled 2023-07-13: qty 1

## 2023-07-13 MED ORDER — HYDRALAZINE HCL 25 MG PO TABS
25.0000 mg | ORAL_TABLET | Freq: Once | ORAL | Status: AC
  Administered 2023-07-13: 25 mg via ORAL
  Filled 2023-07-13: qty 1

## 2023-07-13 MED ORDER — METHOCARBAMOL 500 MG PO TABS
500.0000 mg | ORAL_TABLET | Freq: Once | ORAL | Status: AC
  Administered 2023-07-13: 500 mg via ORAL
  Filled 2023-07-13: qty 1

## 2023-07-13 MED ORDER — SODIUM CHLORIDE 0.9 % IV SOLN
25.0000 mg | Freq: Once | INTRAVENOUS | Status: AC
  Administered 2023-07-13: 25 mg via INTRAVENOUS
  Filled 2023-07-13: qty 1

## 2023-07-13 NOTE — ED Triage Notes (Addendum)
Pt. Stated I've had neck pain, stomach pain with N/V for 3 days.I was at my Dr's office and they sent me over here. Pt stated, only Dilaudid works and Zofran makes her sick.  Cyclic vomiting syndrone.

## 2023-07-13 NOTE — ED Notes (Signed)
EDP cleared patient to have ice and water.[name] [name] water and a cup of ice provided to patient.

## 2023-07-13 NOTE — ED Provider Notes (Signed)
[place] EMERGENCY DEPARTMENT AT [facility] Provider Note   CSN: [id number] Arrival date & time: 07/13/23  1038     History  Chief Complaint  Patient presents with   Abdominal Pain   Neck Pain   Emesis   Nausea    [name] [name] is a 57 y.o. female.  With past medical history of type 2 diabetes, chronic kidney disease, hypertension, cyclical vomiting syndrome secondary to marijuana use who presents to the emergency department with abdominal pain.  States that symptoms began around 2 to 3 days ago.  She describes having upper abdominal pain that is nonradiating.  The pain is severe and sharp.  She has had associated nausea and vomiting innumerable times over the past 2 days with decreased p.o. intake.  She states this is similar to previous episodes of her abdominal pain however she feels like she has been under control for a long time.  Has been taking her home medications as prescribed.  She denies having diarrhea, fever, dysuria, vaginal discharge chills or diaphoresis.  Denies having chest pain, palpitations or shortness of breath.  Additionally she is endorsing neck pain.  She states that this has been ongoing for greater than 1 week.  She describes it as posterior neck pain and stiffness.  She denies having any trauma or falls.  She denies sleeping on different pillows or in a weird and.  She states that she has had a similar episode of this same type and neck pain late last year.  She is having mild headache but denies having changes to her vision like diplopia, dizziness.  Denies having back pain.   Abdominal Pain Associated symptoms: nausea and vomiting   Associated symptoms: no dysuria   Neck Pain Emesis Associated symptoms: abdominal pain        Home Medications Prior to Admission medications   Medication Sig Start Date End Date Taking? Authorizing Provider  clobetasol cream (TEMOVATE) 0.05 % Apply 1 Application topically 2 (two) times daily. Patient not  taking: Reported on 07/07/2023    [provider]  clobetasol ointment (TEMOVATE) 0.05 % Apply 1 Application topically 2 (two) times daily. Patient not taking: Reported on 07/07/2023 01/17/23   [provider]  diltiazem (CARDIZEM CD) 180 MG 24 hr capsule TAKE 1 CAPSULE(180 MG) BY MOUTH IN THE MORNING AND AT BEDTIME 01/17/23   McDiarmid, Leighton Roach, MD  Evolocumab (REPATHA SURECLICK) 140 MG/ML SOAJ Inject 140 mg into the skin every 14 (fourteen) days. 12/08/22   Runell Gess, MD  ferrous sulfate 324 (65 Fe) MG TBEC Take 1 tablet (325 mg total) by mouth every other day. Patient not taking: Reported on 07/07/2023 03/10/23   McDiarmid, Leighton Roach, MD  hydrALAZINE (APRESOLINE) 25 MG tablet Take 25 mg by mouth 3 (three) times daily. 02/20/23   [provider]  hydroquinone 4 % cream Apply topically 2 (two) times daily. Patient not taking: Reported on 07/07/2023    [provider]  Insulin Pen Needle (B-D UF III MINI PEN NEEDLES) 31G X 5 MM MISC USE TWICE DAILY AS DIRECTED 06/10/23   McDiarmid, Leighton Roach, MD  Insulin Pen Needle (NOVOFINE PLUS PEN NEEDLE) 32G X 4 MM MISC [place] injection twice per day. 07/16/21   McDiarmid, Leighton Roach, MD  Lancets (ONETOUCH ULTRASOFT) lancets USE TO CHECK BLOOD SUGAR UP TO FOUR TIMES DAILY Patient not taking: Reported on 07/07/2023 10/08/22   McDiarmid, Leighton Roach, MD  LANTUS SOLOSTAR 100 UNIT/ML Solostar Pen ADMINISTER 20  UNITS UNDER THE SKIN DAILY 03/28/23   McDiarmid, Leighton Roach, MD  lidocaine (LIDODERM) 5 % PLACE 1 PATCH ONTO THE SKIN DAILY. REMOVE AND DISCARD PATCH WITHIN 12 HOURS OR AS DIRECTED 07/13/23   McDiarmid, Leighton Roach, MD  linagliptin (TRADJENTA) 5 MG TABS tablet Take 1 tablet (5 mg total) by mouth daily. 07/22/22   McDiarmid, Leighton Roach, MD  LORazepam (ATIVAN) 1 MG tablet Take 1 tablet (1 mg total) by mouth 2 (two) times daily as needed (Cyclic Vomiting flare up). 07/11/23   McDiarmid, Leighton Roach, MD  methocarbamol (ROBAXIN) 500 MG tablet Take 1 tablet (500 mg total) by  mouth 4 (four) times daily. 07/11/23   McDiarmid, Leighton Roach, MD  ondansetron (ZOFRAN) 4 MG tablet Take 1 tablet (4 mg total) by mouth every 8 (eight) hours as needed for nausea or vomiting. Patient not taking: Reported on 07/07/2023 12/14/22   Hilarie Fredrickson, MD  [facility] VERIO test strip 3 (three) times daily. Patient not taking: Reported on 07/07/2023 11/11/22   [provider]  oxyCODONE (OXY IR/ROXICODONE) 5 MG immediate release tablet Take 1 tablet (5 mg total) by mouth every 6 (six) hours as needed for severe pain (Cyclic Vomiting Flare up). 07/11/23   McDiarmid, Leighton Roach, MD  promethazine (PHENERGAN) 25 MG tablet Take 1 tablet (25 mg total) by mouth every 6 (six) hours as needed for nausea or vomiting. Patient not taking: Reported on 07/07/2023 08/26/22   McDiarmid, Leighton Roach, MD  QUEtiapine (SEROQUEL) 50 MG tablet TAKE 1 TABLET(50 MG) BY MOUTH AT BEDTIME Patient not taking: Reported on 07/07/2023 04/11/23   McDiarmid, Leighton Roach, MD  tacrolimus (PROTOPIC) 0.1 % ointment Apply topically. Patient not taking: Reported on 07/07/2023 01/19/23   [provider]      Allergies    Bee venom, Tegaderm ag mesh [silver], Acetaminophen, Novolog [insulin aspart], Semaglutide, Statins, Amlodipine, Clonidine derivatives, Flexeril [cyclobenzaprine], Lyrica [pregabalin], Nsaids, Ozempic (0.25 or 0.5 mg-dose) [semaglutide(0.25 or 0.5mg -dos)], Tape, Ceftriaxone, and Erythromycin    Review of Systems   Review of Systems  Gastrointestinal:  Positive for abdominal pain, nausea and vomiting.  Genitourinary:  Negative for dysuria.  Musculoskeletal:  Positive for neck pain.  All other systems reviewed and are negative.   Physical Exam Updated Vital Signs BP (!) 190/99 (BP Location: Right Arm)   Pulse (!) 103   Temp 98.6 F (37 C) (Oral)   Resp (!) 23   SpO2 100%  Physical Exam Vitals and nursing note reviewed.  Constitutional:      Appearance: Normal appearance. She is ill-appearing.     Comments:  Crying on exam  HENT:     Head: Normocephalic.  Eyes:     General: No scleral icterus. Neck:     Comments: She has some posterior trapezius pain to palpation particularly on the right side.  There is no midline C-spine tenderness to palpation.  There is no anterior neck pain, bruit, tracheal deviation or thyromegaly.  She has decreased range of motion however is able to move her neck in all directions.  No nuchal rigidity. Cardiovascular:     Rate and Rhythm: Regular rhythm. Tachycardia present.     Heart sounds: Normal heart sounds. No murmur heard. Pulmonary:     Effort: Pulmonary effort is normal. No respiratory distress.     Breath sounds: Normal breath sounds.  Abdominal:     General: Abdomen is protuberant. Bowel sounds are decreased. There is no distension.     Palpations: Abdomen is soft.  Tenderness: There is abdominal tenderness in the epigastric area. There is guarding. There is no rebound.  Skin:    General: Skin is warm and dry.     Capillary Refill: Capillary refill takes less than 2 seconds.  Neurological:     General: No focal deficit present.     Mental Status: She is alert and oriented to person, place, and time.  Psychiatric:        Mood and Affect: Mood normal.        Behavior: Behavior normal.     ED Results / Procedures / Treatments   Labs (all labs ordered are listed, but only abnormal results are displayed) Labs Reviewed  LIPASE, BLOOD - Abnormal; Notable for the following components:      Result Value   Lipase 115 (*)    All other components within normal limits  COMPREHENSIVE METABOLIC PANEL - Abnormal; Notable for the following components:   CO2 19 (*)    Glucose, Bld 185 (*)    BUN 36 (*)    Creatinine, Ser 3.38 (*)    GFR, Estimated 15 (*)    All other components within normal limits  CBC - Abnormal; Notable for the following components:   Hemoglobin 11.6 (*)    HCT 35.8 (*)    RDW 15.9 (*)    All other components within normal limits   URINALYSIS, ROUTINE W REFLEX MICROSCOPIC  TROPONIN I (HIGH SENSITIVITY)  TROPONIN I (HIGH SENSITIVITY)   EKG None  Radiology No results found.  Procedures Procedures   Medications Ordered in ED Medications  sodium chloride 0.9 % bolus 1,000 mL (0 mLs Intravenous Paused 07/13/23 1415)  HYDROmorphone (DILAUDID) injection 1 mg (1 mg Intravenous Given 07/13/23 1239)  promethazine (PHENERGAN) 25 mg in sodium chloride 0.9 % 50 mL IVPB (0 mg Intravenous Stopped 07/13/23 1504)  promethazine (PHENERGAN) injection 25 mg (25 mg Intramuscular Given 07/13/23 1513)  HYDROmorphone (DILAUDID) injection 1 mg (1 mg Intravenous Given 07/13/23 1512)    ED Course/ Medical Decision Making/ A&P    Medical Decision Making Amount and/or Complexity of Data Reviewed Labs: ordered.  Risk Prescription drug management.  Initial Impression and Ddx This 57 year old female who presents to the emergency department with abdominal pain, nausea and vomiting Patient PMH that increases complexity of ED encounter:  type 2 diabetes, chronic kidney disease, hypertension, cyclical vomiting syndrome Differential: Acute hepatobiliary disease, pancreatitis, appendicitis, PUD, gastritis, SBO, diverticulitis, colitis, viral gastroenteritis, Crohn's, UC, vascular catastrophe, UTI, pyelonephritis, renal stone, obstructed stone, infected stone, ovarian torsion, ectopic pregnancy, TOA, PID, STD, etc.   Patient Reassessment and Ultimate Disposition/Management 57 year old female who presents to the emergency department with abdominal pain, nausea and vomiting.  She is quite tender to the epigastrium without rebound tenderness or peritonitic findings.  Her abdomen is soft.  Will obtain labs.  Will give pain control, nausea control and IV fluids.  Will also get a troponin given the location although she is not having chest pain.  Care of patient handed off to [place], PA-C at change of shift. Patient is here for abdominal  pain, nausea and vomiting. I have suspicion this is likely her cyclical vomiting. Non peritonitic abdomen and no fever. No leukocytosis, elyte or LFT derangement. Will need  nausea and pain control, fluids. Will need PO trial after control of symptoms. If able to PO can be discharged home. Please see her note for completion of care.  Final Clinical Impression(s) / ED Diagnoses Final diagnoses:  None  Rx / DC Orders ED Discharge Orders     None         Cristopher Peru, PA-C 07/13/23 1528    Margarita Grizzle, MD 07/14/23 [phone]

## 2023-07-13 NOTE — Progress Notes (Signed)
Visited with patient and provided emotional and spiritual support. Listened and prayed with patient.  Will follow as needed.  Venida Jarvis, [place], [facility], Pager [phone]

## 2023-07-13 NOTE — Assessment & Plan Note (Addendum)
This is chronic for patient.  Unclear trigger for the flareup.  Possible causes include constipation from chronic opioid use, marijuana use or neck pain.  Patient overall on exam is in acute distress with significant abdominal epigastric pain, appears dehydrated most likely due to poor p.o. intake and multiple NN emesis in the last 3-5 days.  Advised patient we will be sending her to the ED for additional workup and need for IV fluid and medication given she has not been able to keep anything down at this time.

## 2023-07-13 NOTE — Progress Notes (Signed)
    SUBJECTIVE:   CHIEF COMPLAINT / HPI:   57 year old female with history of cyclical vomiting syndrome and acute on chronic pancreatitis presenting today for continued nausea and vomiting for the past 2-3 days.  Patient reports that cyclical vomiting syndrome was usually managed by lorazepam and promethazine however she has not been able to keep her medication down and has had worsening nausea and vomiting.  She has had emesis throughout last night and denies any hematemesis.  She is on chronic opioid use with last bowel movement being 5 days ago. Endorses significant abdominal pain but able to pass flatulence. Smokes marijuana occassionaly and last known use Sunday (3 days ago).  Patient believes her current cyclical vomiting syndrome flareup is most likely attributed to her neck pain.   Neck pain Chronic and intermittent but first started 10 month ago Stiff pain that throbbs  bilateral and non radiating. Rated 10/10 Tried Lidocain patch, voltaren gel, cramp release spray. Lidocaine patch provided mild relieve of the pain Matharcarbol is ineffective and flexeril makes her sick. No images on file.   PERTINENT  PMH / PSH: Reviewed  OBJECTIVE:   BP (!) 160/74   Pulse 100   Ht 5' 1.5" (1.562 m)   Wt 149 lb 12.8 oz (67.9 kg)   SpO2 98%   BMI 27.85 kg/m    Physical Exam General: Alert, ill appearing, NAD, Oriented x4 HEENT: tender will palpation on the lateral aspect of the neck bilaterally, dry mucous membrane Cardiovascular: RRR, No Murmurs, Normal S2/S2 Respiratory: CTAB, No wheezing or Rales Abdomen: No distension, decreased bowel sound, diffused tenderness Extremities: No edema on extremities   Skin: Warm and dry  ASSESSMENT/PLAN:   Cyclic vomiting syndrome This is chronic for patient.  Unclear trigger for the flareup.  Possible causes include constipation from chronic opioid use, marijuana use or neck pain.  Patient overall on exam is in acute distress with significant  abdominal epigastric pain, appears dehydrated most likely due to poor p.o. intake and multiple NN emesis in the last 3-5 days.  Advised patient we will be sending her to the ED for additional workup and need for IV fluid and medication given she has not been able to keep anything down at this time.   Neck pain Unclear etiology but this appears to be chronic. Could be close collateral versus ACS though unlikely given nature of presentation.  Will be sending patient to the ED for further workup.  Jerre Simon, MD [facility]

## 2023-07-13 NOTE — Discharge Instructions (Addendum)
It was a pleasure taking care of you today.  As discussed, your workup was reassuring.  Your blood pressure was elevated while you were in the ED.  Continue taking your blood pressure medications.  Please have your blood pressure rechecked tomorrow or the next day by your PCP.  I am sending you home with Phenergan.  Take as needed for nausea.  I suspect your neck pain is related to the muscles in your neck.  I am sending you home with a muscle relaxer.  Take as needed for pain.  You may also purchase over-the-counter Lidoderm patches or Voltaren gel for added pain relief.  Return to the ER for new or worsening symptoms.

## 2023-07-13 NOTE — ED Provider Notes (Signed)
Care assumed from Mertha Baars, PA-C at shift change pending symptomatic relief and po challenge. See her note for full HPI.  In short, patient is a 57 year old female who presents to the ED due to nausea and vomiting associate with abdominal pain x 2 to 3 days.  History of cyclic vomiting.  Notes it feels similar.  Also admits to some neck pain.  No fever or chills. Physical Exam  BP (!) 190/99 (BP Location: Right Arm)   Pulse (!) 103   Temp 98.6 F (37 C) (Oral)   Resp (!) 23   SpO2 100%   Physical Exam Vitals and nursing note reviewed.  Constitutional:      General: She is not in acute distress.    Appearance: She is not ill-appearing.  HENT:     Head: Normocephalic.  Eyes:     Pupils: Pupils are equal, round, and reactive to light.  Cardiovascular:     Rate and Rhythm: Normal rate and regular rhythm.     Pulses: Normal pulses.     Heart sounds: Normal heart sounds. No murmur heard.    No friction rub. No gallop.  Pulmonary:     Effort: Pulmonary effort is normal.     Breath sounds: Normal breath sounds.  Abdominal:     General: Abdomen is flat. There is no distension.     Palpations: Abdomen is soft.     Tenderness: There is abdominal tenderness. There is no guarding or rebound.     Comments: Diffuse tenderness.  Musculoskeletal:        General: Normal range of motion.     Cervical back: Neck supple.  Skin:    General: Skin is warm and dry.  Neurological:     General: No focal deficit present.     Mental Status: She is alert.  Psychiatric:        Mood and Affect: Mood normal.        Behavior: Behavior normal.     Procedures  Procedures  ED Course / MDM    Medical Decision Making Amount and/or Complexity of Data Reviewed External Data Reviewed: notes. Labs: ordered. Decision-making details documented in ED Course. Radiology: ordered and independent interpretation performed. Decision-making details documented in ED Course. ECG/medicine tests: ordered and  independent interpretation performed. Decision-making details documented in ED Course.  Risk Prescription drug management.   3:38 PM reassessed patient at bedside.  History of cyclic vomiting.  Patient admits to nausea and vomiting.  Also endorses upper abdominal pain.  Notes that she typically has abdominal pain with her cyclic vomiting.  Awaiting IV team for pain management, nausea medication, and IV fluids. CT abdomen ordered.  CT abdomen personally reviewed and interpreted which demonstrates normal appearance of the pancreas.  Does demonstrate some bladder wall thickening.  UA negative for signs of infection.  Patient denies any dysuria.  Low suspicion for acute cystitis.  Lipase slightly elevated at 115.  Appears to be chronically elevated.   Troponin flat.  Patient hypertensive during beginning of ED stay.  Patient notes she has not taken her hypertensive medications in 3 days due to nausea and vomiting.  Patient given home dose of medications here in the ED with improvement in BP to 132/87.  Patient denies any chest pain. EKG NSR, no signs of acute ischemia. Low suspicion for ACS. Advised patient to continue taking BP medication as prescribed. Confirmed she has them at home. Advised patient to have BP rechecked tomorrow by PCP. Patient denies headache  or visual changes. Low suspicion for HTN emergency/urgency.   Upon reassessment, patient able to tolerate po. She notes she is ready to be discharged home.  Patient able to fully range neck.  Equal grip strength.  Low suspicion for central cord compression.  No headache or fever/chills to suggest meningitis.  Suspect muscular strain.  Patient discharged with Robaxin.  Advised patient to follow-up with PCP in 2 to 3 days for recheck of BP.  Patient discharged with symptomatic treatment.  Patient stable for discharge. Strict ED precautions discussed with patient. Patient states understanding and agrees to plan. Patient discharged home in no acute  distress and stable vitals    Jesusita Oka 07/13/23 2155    Gwyneth Sprout, MD 07/17/23 1317

## 2023-07-13 NOTE — Patient Instructions (Signed)
It was wonderful to see you today. Thank you for allowing me to be a part of your care. Below is a short summary of what we discussed at your visit today:  I am concerned you are not able to keep down your medication and your significant abdominal pain.  I will be sending you to the ED for further evaluation and so that you can get IV fluid and medication as well.   Please bring all of your medications to every appointment!  If you have any questions or concerns, please do not hesitate to contact us via phone or MyChart message.   Jerre Simon, MD [facility]

## 2023-07-25 ENCOUNTER — Telehealth: Payer: Self-pay

## 2023-07-25 DIAGNOSIS — H1010 Acute atopic conjunctivitis, unspecified eye: Secondary | ICD-10-CM

## 2023-07-25 MED ORDER — OLOPATADINE HCL 0.2 % OP SOLN
1.0000 [drp] | Freq: Every day | OPHTHALMIC | 0 refills | Status: DC | PRN
Start: 2023-07-25 — End: 2023-09-22

## 2023-07-25 NOTE — Telephone Encounter (Signed)
Patient calls nurse line requesting treatment for pink eye.   She reports that symptoms started last night. Complains of redness, itchiness, drainage from left eye and sensitivity to light.   She denies blurry vision or spots in visual field. She does not wear contacts.   She is asking if eye drops can be sent to the [facility] on [address].   Patient prefers to not have to come in for additional appointment. She is scheduled to see PCP on 8/8 for hospital follow up.   Will forward request to PCP.   Veronda Prude, RN

## 2023-07-25 NOTE — Telephone Encounter (Signed)
Prescription olopatadine ophthalmology drops for allergic conjunctivitis

## 2023-07-25 NOTE — Telephone Encounter (Signed)
Please let patient know eye drops called into pharmacy.

## 2023-07-26 NOTE — Telephone Encounter (Signed)
Spoke with patient informed of note left by Dr. Perley Jain. Aquilla Solian, CMA

## 2023-08-02 ENCOUNTER — Encounter: Payer: Self-pay | Admitting: Internal Medicine

## 2023-08-04 ENCOUNTER — Ambulatory Visit: Payer: 59 | Admitting: Family Medicine

## 2023-08-11 ENCOUNTER — Other Ambulatory Visit: Payer: Self-pay

## 2023-08-11 DIAGNOSIS — E1143 Type 2 diabetes mellitus with diabetic autonomic (poly)neuropathy: Secondary | ICD-10-CM

## 2023-08-11 DIAGNOSIS — R1115 Cyclical vomiting syndrome unrelated to migraine: Secondary | ICD-10-CM

## 2023-08-15 MED ORDER — LORAZEPAM 1 MG PO TABS
1.0000 mg | ORAL_TABLET | Freq: Two times a day (BID) | ORAL | 0 refills | Status: DC | PRN
Start: 2023-08-15 — End: 2023-09-12

## 2023-08-15 MED ORDER — OXYCODONE HCL 5 MG PO TABS
5.0000 mg | ORAL_TABLET | Freq: Four times a day (QID) | ORAL | 0 refills | Status: DC | PRN
Start: 2023-08-15 — End: 2023-09-12

## 2023-08-18 ENCOUNTER — Encounter: Payer: Self-pay | Admitting: Family Medicine

## 2023-08-18 ENCOUNTER — Telehealth: Payer: Self-pay | Admitting: Cardiovascular Disease

## 2023-08-18 ENCOUNTER — Ambulatory Visit (INDEPENDENT_AMBULATORY_CARE_PROVIDER_SITE_OTHER): Payer: 59 | Admitting: Family Medicine

## 2023-08-18 ENCOUNTER — Other Ambulatory Visit: Payer: Self-pay | Admitting: Cardiovascular Disease

## 2023-08-18 ENCOUNTER — Other Ambulatory Visit: Payer: Self-pay | Admitting: Family Medicine

## 2023-08-18 VITALS — BP 130/70 | HR 75 | Wt 155.0 lb

## 2023-08-18 DIAGNOSIS — R1115 Cyclical vomiting syndrome unrelated to migraine: Secondary | ICD-10-CM

## 2023-08-18 DIAGNOSIS — N184 Chronic kidney disease, stage 4 (severe): Secondary | ICD-10-CM | POA: Diagnosis not present

## 2023-08-18 DIAGNOSIS — G8929 Other chronic pain: Secondary | ICD-10-CM

## 2023-08-18 DIAGNOSIS — M542 Cervicalgia: Secondary | ICD-10-CM

## 2023-08-18 DIAGNOSIS — I739 Peripheral vascular disease, unspecified: Secondary | ICD-10-CM | POA: Diagnosis not present

## 2023-08-18 DIAGNOSIS — Z0000 Encounter for general adult medical examination without abnormal findings: Secondary | ICD-10-CM

## 2023-08-18 DIAGNOSIS — Z720 Tobacco use: Secondary | ICD-10-CM

## 2023-08-18 DIAGNOSIS — I152 Hypertension secondary to endocrine disorders: Secondary | ICD-10-CM

## 2023-08-18 DIAGNOSIS — E1122 Type 2 diabetes mellitus with diabetic chronic kidney disease: Secondary | ICD-10-CM | POA: Diagnosis not present

## 2023-08-18 DIAGNOSIS — F17210 Nicotine dependence, cigarettes, uncomplicated: Secondary | ICD-10-CM

## 2023-08-18 DIAGNOSIS — Z794 Long term (current) use of insulin: Secondary | ICD-10-CM

## 2023-08-18 DIAGNOSIS — E1159 Type 2 diabetes mellitus with other circulatory complications: Secondary | ICD-10-CM

## 2023-08-18 MED ORDER — TRAMADOL HCL 50 MG PO TABS
50.0000 mg | ORAL_TABLET | Freq: Four times a day (QID) | ORAL | 0 refills | Status: AC | PRN
Start: 2023-08-18 — End: 2023-08-23

## 2023-08-18 NOTE — Telephone Encounter (Signed)
Refill sent in another encounter.

## 2023-08-18 NOTE — Telephone Encounter (Signed)
Patient states she forgot her repatha in the car. She states her insurance stated if she gets a new Rx sent they will override and pay for her repatha. Are you guys able to send new Rx for repatha

## 2023-08-18 NOTE — Patient Instructions (Signed)
Consider starting an baby Aspirin (81 mg) at least twice a week.  This is to prevent blood clots from forming in your arteries.  Try the tramadol to help with pain in neck.  Referral to Physical Therapy for one time visit for instructions on releasing the neck trigger points in the muscles.    A referral to screen you for lung cancer was sent in.  You should be called to set up an appointment for the CAT scan within 5 business days.  If you do not hear from the CAT scan folks, let our office know.

## 2023-08-18 NOTE — Telephone Encounter (Signed)
Pt c/o medication issue:  1. Name of Medication: Evolocumab (REPATHA SURECLICK) 140 MG/ML SOAJ   2. How are you currently taking this medication (dosage and times per day)?   3. Are you having a reaction (difficulty breathing--STAT)?   4. What is your medication issue? Patient called stating she left her medication in her car, she states she called her insurance and they told her if she send another script to the pharmacy they can override it and they will cover it.  If a new script can be sent to  [id number] - , [place] - [address] AT [facility] [address]  Patient stated she was due for her injection yesterday.

## 2023-08-19 ENCOUNTER — Encounter: Payer: Self-pay | Admitting: Family Medicine

## 2023-08-19 LAB — MICROALBUMIN / CREATININE URINE RATIO
Creatinine, Urine: 86.2 mg/dL
Microalb/Creat Ratio: 567 mg/g{creat} — ABNORMAL HIGH (ref 0–29)
Microalbumin, Urine: 488.6 ug/mL

## 2023-08-19 LAB — RENAL FUNCTION PANEL
Albumin: 4.4 g/dL (ref 3.8–4.9)
BUN/Creatinine Ratio: 14 (ref 9–23)
BUN: 48 mg/dL — ABNORMAL HIGH (ref 6–24)
CO2: 18 mmol/L — ABNORMAL LOW (ref 20–29)
Calcium: 9.4 mg/dL (ref 8.7–10.2)
Chloride: 106 mmol/L (ref 96–106)
Creatinine, Ser: 3.37 mg/dL — ABNORMAL HIGH (ref 0.57–1.00)
Glucose: 200 mg/dL — ABNORMAL HIGH (ref 70–99)
Phosphorus: 4.3 mg/dL (ref 3.0–4.3)
Potassium: 4.8 mmol/L (ref 3.5–5.2)
Sodium: 140 mmol/L (ref 134–144)
eGFR: 15 mL/min/{1.73_m2} — ABNORMAL LOW (ref >59)

## 2023-08-19 NOTE — Assessment & Plan Note (Signed)
Established problem Well Controlled. Patient is at goal BP No signs of complications, medication side effects, or red flags. Continue current medications and other regiments.

## 2023-08-19 NOTE — Progress Notes (Signed)
[name] [name] is alone Sources of clinical information for visit is/are patient and ED visit note 07/13/23 Nursing assessment for this office visit was reviewed with the patient for accuracy and revision.     Previous Report(s) Reviewed: ER records     08/18/2023    8:49 AM  Depression screen PHQ 2/9  Decreased Interest 0  Down, Depressed, Hopeless 0  PHQ - 2 Score 0  Altered sleeping 2  Tired, decreased energy 0  Change in appetite 3  Feeling bad or failure about yourself  0  Trouble concentrating 0  Moving slowly or fidgety/restless 0  Suicidal thoughts 0  PHQ-9 Score 5   Flowsheet Row Office Visit from 08/18/2023 in [facility] Office Visit from 06/23/2023 in [facility] Clinical Support from 06/14/2023 in [facility]  Thoughts that you would be better off dead, or of hurting yourself in some way Not at all Not at all Not at all  PHQ-9 Total Score 5 4 0          06/14/2023    3:15 PM 09/16/2021    3:01 PM 06/05/2020   10:31 AM 11/16/2018    2:50 PM 07/28/2018    8:51 AM  Fall Risk   Falls in the past year? 0 0 1 0 No  Number falls in past yr: 0 0 0 0   Injury with Fall? 0 0 1 0   Follow up Falls evaluation completed;Education provided;Falls prevention discussed           08/18/2023    8:49 AM 06/23/2023    9:23 AM 06/14/2023    3:21 PM  PHQ9 SCORE ONLY  PHQ-9 Total Score 5 4 0    There are no preventive care reminders to display for this patient.  Health Maintenance Due  Topic Date Due   Diabetic kidney evaluation - Urine ACR  09/27/2014   Lung Cancer Screening  Never done   FOOT EXAM  07/10/2021   Colonoscopy  11/19/2022   COVID-19 Vaccine (6 - 2023-24 season) 01/27/2023   INFLUENZA VACCINE  07/28/2023      History/P.E. limitations: none  There are no preventive care reminders to display for this patient.  Diabetes Health Maintenance Due  Topic Date Due   FOOT EXAM  07/10/2021    OPHTHALMOLOGY EXAM  12/11/2023   HEMOGLOBIN A1C  12/23/2023    Health Maintenance Due  Topic Date Due   Diabetic kidney evaluation - Urine ACR  09/27/2014   Lung Cancer Screening  Never done   FOOT EXAM  07/10/2021   Colonoscopy  11/19/2022   COVID-19 Vaccine (6 - 2023-24 season) 01/27/2023   INFLUENZA VACCINE  07/28/2023     Chief Complaint  Patient presents with   Follow-up     --------------------------------------------------------------------------------------------------------------------------------------------- Visit Problem List with A/P  Cyclic vomiting syndrome Established problem. Stable currently Patient had ED visit on 7/17 for exacerbation of her cyclic vomiting that did not require hospitalization.  Continue current medications abortive therapy with oxycodone, lorazepam and phenergan, all prn exacerbation   Hypertension associated with diabetes (HCC) Established problem Well Controlled. Patient is at goal BP No signs of complications, medication side effects, or red flags. Continue current medications and other regiments.   Claudication in peripheral vascular disease (HCC) Established problem Stable Discussed starting a Aspirin 81 mg daily, if not comfortable daily, then every two to three days.    Healthcare maintenance We discussed the role of  LD Lung CT in screening for lung cancer.  Patient agreed to LD CT screening - order placed.  We discussed stopping smoking. She is contemplative.  Not interested in medication assistance for now.  I did suggest trying nicotine lozenge when she has urge for cigarette.    Emphsized to Ms Hedeen the importance of going to her app't with Dr Marina Goodell (GI) to discuss colonoscopy process given her 2020 history of multiple adenomatous polys on colonscopy.  Tobacco abuse Established problem  We discussed stopping smoking. She is contemplative.  Not interested in medication assistance for now.  I did suggest trying  nicotine lozenge when she has urge for cigarette.

## 2023-08-19 NOTE — Assessment & Plan Note (Addendum)
We discussed the role of LD Lung CT in screening for lung cancer.  Patient agreed to LD CT screening - order placed.  We discussed stopping smoking. She is contemplative.  Not interested in medication assistance for now.  I did suggest trying nicotine lozenge when she has urge for cigarette.    Emphsized to [name] [name] the importance of going to her app't with Dr Marina Goodell (GI) to discuss colonoscopy process given her 2020 history of multiple adenomatous polys on colonscopy.

## 2023-08-19 NOTE — Assessment & Plan Note (Signed)
Established problem Stable Discussed starting a Aspirin 81 mg daily, if not comfortable daily, then every two to three days.

## 2023-08-19 NOTE — Assessment & Plan Note (Signed)
Established problem. Stable currently Patient had ED visit on 7/17 for exacerbation of her cyclic vomiting that did not require hospitalization.  Continue current medications abortive therapy with oxycodone, lorazepam and phenergan, all prn exacerbation

## 2023-08-19 NOTE — Assessment & Plan Note (Signed)
Established problem worsened.  Acute exacerbation of chronic neck pain Onset: month ago - Seen at ED 7/17 with neck pain and exacerbation of her cyclic vomiting Location: right posterior neck Quality: ache Severity: moderate Function: limits rotation of neck to left Pattern: persistent Course: intermittent episodes of worsening Radiation: to right side occiput Relief: no relief with methacarbamol she receive on ED visit 7/17 Precipitant:  Awoke from sleep with the pain and neck stiffness  [name] [name] believes that the pain worsens her cyclic vomiting Trauma (Acute or Chronic): none Prior Diagnostic Testing or Treatments: patient evaluated by me 03/2020 for similar presentation.  Referred to Physical Therapy for neck muscle spasm with hopes that they may be able to try trial of TENS unit, but her insurance at the time would not pay for it.   Patient has soft, focal tenderness just inferior and posterior to right mastoid process Limiter lateral rotation to left compared to right rotation.  A/ Possible tender point in right posterior neck muscle P/ Tramadol 50 mg, 1 q 6 hours prn #15 tab, no RF Referral to Physical Therapy for soft tissue release exercise eduction and consideration of TENS unit which may now be covered by her dual eligible Medicare/Medicaid.

## 2023-08-19 NOTE — Assessment & Plan Note (Signed)
Established problem  We discussed stopping smoking. She is contemplative.  Not interested in medication assistance for now.  I did suggest trying nicotine lozenge when she has urge for cigarette.

## 2023-08-30 ENCOUNTER — Ambulatory Visit (HOSPITAL_COMMUNITY): Payer: 59 | Admitting: Physical Therapy

## 2023-08-31 ENCOUNTER — Other Ambulatory Visit: Payer: Self-pay | Admitting: Family Medicine

## 2023-09-08 ENCOUNTER — Ambulatory Visit (AMBULATORY_SURGERY_CENTER): Payer: 59 | Admitting: *Deleted

## 2023-09-08 VITALS — Ht 61.0 in | Wt 157.2 lb

## 2023-09-08 DIAGNOSIS — Z86010 Personal history of colonic polyps: Secondary | ICD-10-CM

## 2023-09-08 MED ORDER — NA SULFATE-K SULFATE-MG SULF 17.5-3.13-1.6 GM/177ML PO SOLN
1.0000 | Freq: Once | ORAL | 0 refills | Status: AC
Start: 2023-09-08 — End: 2023-09-08

## 2023-09-08 MED ORDER — ONDANSETRON HCL 4 MG PO TABS
ORAL_TABLET | ORAL | 0 refills | Status: DC
Start: 2023-09-08 — End: 2024-04-23

## 2023-09-08 NOTE — Progress Notes (Signed)
No egg or soy allergy known to patient  No issues known to pt with past sedation with any surgeries or procedures Patient denies ever being told they had issues or difficulty with intubation  No FH of Malignant Hyperthermia Pt is not on diet pills Pt is not on  home 02  Pt is not on blood thinners  Pt has issues with constipation,2 day prep given along with zofran. No A fib or A flutter Have any cardiac testing pending--no Pt instructed to use Single[employer] or GoodRx for a price reduction on prep

## 2023-09-09 ENCOUNTER — Ambulatory Visit
Admission: RE | Admit: 2023-09-09 | Discharge: 2023-09-09 | Disposition: A | Discharge status: Home / Self Care | Payer: 59 | Source: Ambulatory Visit | Attending: Family Medicine | Admitting: Family Medicine

## 2023-09-09 DIAGNOSIS — F17210 Nicotine dependence, cigarettes, uncomplicated: Secondary | ICD-10-CM

## 2023-09-12 ENCOUNTER — Other Ambulatory Visit: Payer: Self-pay

## 2023-09-12 DIAGNOSIS — E1143 Type 2 diabetes mellitus with diabetic autonomic (poly)neuropathy: Secondary | ICD-10-CM

## 2023-09-12 DIAGNOSIS — R1115 Cyclical vomiting syndrome unrelated to migraine: Secondary | ICD-10-CM

## 2023-09-12 MED ORDER — OXYCODONE HCL 5 MG PO TABS: 5.0000 mg | ORAL_TABLET | Freq: Four times a day (QID) | ORAL | 0 refills | Status: DC | PRN

## 2023-09-12 MED ORDER — LORAZEPAM 1 MG PO TABS
1.0000 mg | ORAL_TABLET | Freq: Two times a day (BID) | ORAL | 0 refills | Status: DC | PRN
Start: 2023-09-14 — End: 2023-10-12

## 2023-09-12 NOTE — Telephone Encounter (Signed)
Patient calls nurse line requesting refills on medications. Oxycodone and lorazepam pended to encounter.   Patient is also requesting phenergan suppositories. She reports having oral tablets, however, is out of suppositories.   This is not on current med list. Will forward request to PCP.   Veronda Prude, RN

## 2023-09-19 ENCOUNTER — Encounter: Payer: Self-pay | Admitting: Family Medicine

## 2023-09-19 DIAGNOSIS — G729 Myopathy, unspecified: Secondary | ICD-10-CM | POA: Insufficient documentation

## 2023-09-19 HISTORY — DX: Myopathy, unspecified: G72.9

## 2023-09-22 ENCOUNTER — Encounter: Payer: Self-pay | Admitting: Family Medicine

## 2023-09-22 ENCOUNTER — Ambulatory Visit (INDEPENDENT_AMBULATORY_CARE_PROVIDER_SITE_OTHER): Payer: 59 | Admitting: Family Medicine

## 2023-09-22 ENCOUNTER — Other Ambulatory Visit: Payer: Self-pay | Admitting: Family Medicine

## 2023-09-22 VITALS — BP 170/75 | HR 73 | Ht 64.0 in | Wt 159.4 lb

## 2023-09-22 DIAGNOSIS — G729 Myopathy, unspecified: Secondary | ICD-10-CM

## 2023-09-22 DIAGNOSIS — N2581 Secondary hyperparathyroidism of renal origin: Secondary | ICD-10-CM | POA: Diagnosis not present

## 2023-09-22 DIAGNOSIS — K3184 Gastroparesis: Secondary | ICD-10-CM

## 2023-09-22 DIAGNOSIS — I152 Hypertension secondary to endocrine disorders: Secondary | ICD-10-CM

## 2023-09-22 DIAGNOSIS — E1169 Type 2 diabetes mellitus with other specified complication: Secondary | ICD-10-CM | POA: Diagnosis not present

## 2023-09-22 DIAGNOSIS — I739 Peripheral vascular disease, unspecified: Secondary | ICD-10-CM

## 2023-09-22 DIAGNOSIS — E118 Type 2 diabetes mellitus with unspecified complications: Secondary | ICD-10-CM | POA: Diagnosis not present

## 2023-09-22 DIAGNOSIS — H029 Unspecified disorder of eyelid: Secondary | ICD-10-CM

## 2023-09-22 DIAGNOSIS — J432 Centrilobular emphysema: Secondary | ICD-10-CM | POA: Insufficient documentation

## 2023-09-22 DIAGNOSIS — E1159 Type 2 diabetes mellitus with other circulatory complications: Secondary | ICD-10-CM

## 2023-09-22 DIAGNOSIS — K5909 Other constipation: Secondary | ICD-10-CM

## 2023-09-22 DIAGNOSIS — E1122 Type 2 diabetes mellitus with diabetic chronic kidney disease: Secondary | ICD-10-CM

## 2023-09-22 DIAGNOSIS — E1142 Type 2 diabetes mellitus with diabetic polyneuropathy: Secondary | ICD-10-CM

## 2023-09-22 DIAGNOSIS — E1143 Type 2 diabetes mellitus with diabetic autonomic (poly)neuropathy: Secondary | ICD-10-CM

## 2023-09-22 DIAGNOSIS — N184 Chronic kidney disease, stage 4 (severe): Secondary | ICD-10-CM

## 2023-09-22 DIAGNOSIS — Z794 Long term (current) use of insulin: Secondary | ICD-10-CM

## 2023-09-22 DIAGNOSIS — R1115 Cyclical vomiting syndrome unrelated to migraine: Secondary | ICD-10-CM

## 2023-09-22 LAB — POCT GLYCOSYLATED HEMOGLOBIN (HGB A1C): HbA1c, POC (controlled diabetic range): 6.1 % (ref 0.0–7.0)

## 2023-09-22 MED ORDER — LUBIPROSTONE 24 MCG PO CAPS: 24.0000 ug | ORAL_CAPSULE | Freq: Two times a day (BID) | ORAL | 0 refills | Status: DC

## 2023-09-22 NOTE — Patient Instructions (Signed)
Your A1c of 6.1% is excellent.  You can stay at your current dose of insulin or you can try reducing it by one unit a week and see how you do. It is your choice as you have a good sense of what works for you.  A referral back to Dr Clelia Croft has been made to look and at lesion on the corner of your right eye.    Your blood pressure is high, but it may be due to the physical stress of your prep for the colonoscopy.  Let's revisit in 3 weeks to see how it is doing once the colonoscopy is over.   I am waiting for the radiologist's reading of your Lung Scan.  I will let you know the results as soon as it is available.

## 2023-09-23 NOTE — Progress Notes (Signed)
[name] [name] is alone Sources of clinical information for visit is/are patient and past medical records. Nursing assessment for this office visit was reviewed with the patient for accuracy and revision.     Previous Report(s) Reviewed: office notes GI colonoscopy pre-procedure visit and LD Lung CT reading    08/18/2023    8:49 AM  Depression screen PHQ 2/9  Decreased Interest 0  Down, Depressed, Hopeless 0  PHQ - 2 Score 0  Altered sleeping 2  Tired, decreased energy 0  Change in appetite 3  Feeling bad or failure about yourself  0  Trouble concentrating 0  Moving slowly or fidgety/restless 0  Suicidal thoughts 0  PHQ-9 Score 5   Flowsheet Row Office Visit from 08/18/2023 in [facility] Office Visit from 06/23/2023 in [facility] Clinical Support from 06/14/2023 in [facility]  Thoughts that you would be better off dead, or of hurting yourself in some way Not at all Not at all Not at all  PHQ-9 Total Score 5 4 0          06/14/2023    3:15 PM 09/16/2021    3:01 PM 06/05/2020   10:31 AM 11/16/2018    2:50 PM 07/28/2018    8:51 AM  Fall Risk   Falls in the past year? 0 0 1 0 No  Number falls in past yr: 0 0 0 0   Injury with Fall? 0 0 1 0   Follow up Falls evaluation completed;Education provided;Falls prevention discussed           08/18/2023    8:49 AM 06/23/2023    9:23 AM 06/14/2023    3:21 PM  PHQ9 SCORE ONLY  PHQ-9 Total Score 5 4 0    There are no preventive care reminders to display for this patient.  Health Maintenance Due  Topic Date Due   Zoster Vaccines- Shingrix (1 of 2) Never done   Colonoscopy  11/19/2022   INFLUENZA VACCINE  07/28/2023   COVID-19 Vaccine (6 - 2023-24 season) 08/28/2023      History/P.E. limitations: none  There are no preventive care reminders to display for this patient.  Diabetes Health Maintenance Due  Topic Date Due   OPHTHALMOLOGY EXAM  12/11/2023    HEMOGLOBIN A1C  03/21/2024    Health Maintenance Due  Topic Date Due   Zoster Vaccines- Shingrix (1 of 2) Never done   Colonoscopy  11/19/2022   INFLUENZA VACCINE  07/28/2023   COVID-19 Vaccine (6 - 2023-24 season) 08/28/2023     Chief Complaint  Patient presents with   Diabetes     --------------------------------------------------------------------------------------------------------------------------------------------- Visit Problem List with A/P  Centrilobular lung emphysema (HCC) New diagnosis Centrilobular emphysema seen on screening LD Lung CT 09/09/23. (+) Tobacco smoking Letter sent informing of finding and recommendation we discuss at next office visit   Likely Spirometry with Dr Raymondo Band along with smoking cessation counseling.   Claudication in peripheral vascular disease (HCC) Established problem Patient has not started daily aspirin 81 mg.  Reports she forgot about it.  Recommend she start in as part of her PAD treatment regiment.   Myopathy, unspecified Patient has had myopathy with several statins. Patient on Repatha.   Type 2 diabetes mellitus with diabetic chronic kidney disease (HCC) Established problem Well Controlled. Patient is at goal of A1c < 7.5%. No signs of complications, medication side effects, or red flags. Continue current medications and other regiments, though Ms  Mccomb may try a trial of reducing her morning Lantus by one unit a week to see how her glycemic control does.    Hypertension associated with diabetes (HCC) Established problem Uncontrolled.  Patient is not at goal of <140/90. Patient having significant abdominal discomfort with her slow GI prep for screening colonoscopy.  In past, Ms Krinsky's BP is very sensitive to exacerbations of cyclic voimiting/gastroparesis/THC-related vomiting.   Will reassess BP after completion of screening colonoscopy process.    Constipation Exacerbation of established problem Ms Montejano has not  had a BM response to her slow GI prep prior to colonoscopy.  She is taking ducolax and miralax over several days.  She feels her abdomin is distended.   Exam: abdomin may be sl. Distended but no increase perc typmany nor succusion splash. (+) BS.   A/ Constipation, chronic recurrent P/ Restart patient's Amitiza 24 mg twice a day and continue the prep recommended by GI.  Let GI know if she is not having a tolerable response to her prep.

## 2023-09-23 NOTE — Assessment & Plan Note (Signed)
Exacerbation of established problem [name] [name] has not had a BM response to her slow GI prep prior to colonoscopy.  She is taking ducolax and miralax over several days.  She feels her abdomin is distended.   Exam: abdomin may be sl. Distended but no increase perc typmany nor succusion splash. (+) BS.   A/ Constipation, chronic recurrent P/ Restart patient's Amitiza 24 mg twice a day and continue the prep recommended by GI.  Let GI know if she is not having a tolerable response to her prep.

## 2023-09-23 NOTE — Assessment & Plan Note (Signed)
Patient has had myopathy with several statins. Patient on Repatha.

## 2023-09-23 NOTE — Assessment & Plan Note (Addendum)
New diagnosis Centrilobular emphysema seen on screening LD Lung CT 09/09/23. (+) Tobacco smoking Letter sent informing of finding and recommendation we discuss at next office visit   Likely Spirometry with Dr Raymondo Band along with smoking cessation counseling.

## 2023-09-23 NOTE — Assessment & Plan Note (Signed)
Established problem Uncontrolled.  Patient is not at goal of <140/90. Patient having significant abdominal discomfort with her slow GI prep for screening colonoscopy.  In past, [name] [name]'s BP is very sensitive to exacerbations of cyclic voimiting/gastroparesis/THC-related vomiting.   Will reassess BP after completion of screening colonoscopy process.

## 2023-09-23 NOTE — Assessment & Plan Note (Signed)
Established problem Well Controlled. Patient is at goal of A1c < 7.5%. No signs of complications, medication side effects, or red flags. Continue current medications and other regiments, though [name] [name] may try a trial of reducing her morning Lantus by one unit a week to see how her glycemic control does.

## 2023-09-23 NOTE — Assessment & Plan Note (Signed)
Established problem Patient has not started daily aspirin 81 mg.  Reports she forgot about it.  Recommend she start in as part of her PAD treatment regiment.

## 2023-09-26 ENCOUNTER — Ambulatory Visit: Payer: 59

## 2023-09-26 ENCOUNTER — Telehealth: Payer: Self-pay

## 2023-09-26 ENCOUNTER — Other Ambulatory Visit: Payer: Self-pay

## 2023-09-26 DIAGNOSIS — R1115 Cyclical vomiting syndrome unrelated to migraine: Secondary | ICD-10-CM

## 2023-09-26 DIAGNOSIS — E1143 Type 2 diabetes mellitus with diabetic autonomic (poly)neuropathy: Secondary | ICD-10-CM

## 2023-09-26 DIAGNOSIS — I152 Hypertension secondary to endocrine disorders: Secondary | ICD-10-CM

## 2023-09-26 DIAGNOSIS — E1159 Type 2 diabetes mellitus with other circulatory complications: Secondary | ICD-10-CM

## 2023-09-26 DIAGNOSIS — K3184 Gastroparesis: Secondary | ICD-10-CM

## 2023-09-26 MED ORDER — PROMETHAZINE HCL 25 MG PO TABS
25.0000 mg | ORAL_TABLET | Freq: Four times a day (QID) | ORAL | 0 refills | Status: DC | PRN
Start: 2023-09-26 — End: 2023-10-11

## 2023-09-26 MED ORDER — DILTIAZEM HCL ER COATED BEADS 180 MG PO CP24
180.0000 mg | ORAL_CAPSULE | Freq: Every day | ORAL | 0 refills | Status: DC
Start: 2023-09-26 — End: 2023-10-11

## 2023-09-26 NOTE — Telephone Encounter (Signed)
Patient calls nurse line regarding medication refill request. Pended refills for diltiazem and promethazine oral tablets in previous refill encounter.   Patient reports that she also receives promethazine suppositories. She is requesting refill of the suppository as well.   This is not on current med list. Please advise.   Veronda Prude, RN

## 2023-09-27 MED ORDER — PROMETHAZINE HCL 25 MG RE SUPP: 25.0000 mg | Freq: Four times a day (QID) | RECTAL | 3 refills | Status: DC | PRN

## 2023-09-28 ENCOUNTER — Telehealth: Payer: Self-pay

## 2023-09-28 ENCOUNTER — Telehealth: Payer: Self-pay | Admitting: Internal Medicine

## 2023-09-28 NOTE — Telephone Encounter (Signed)
RN returned call to patient after she called [facility]. Patient is scheduled for colonoscopy on Thursday, 09/29/23. Patient was crying and stating that she is "so sick" and unable to drink the Suprep. Patient states she took 4 Dulcolax and Miralax yesterday as instructed. She states she took Compazine 1 hour prior to trying to start the Suprep, but she has started vomiting and has not been able to drink any of the Suprep. She states she will not be able to drink the prep. Patient states she had expressed concern about completing the prep due to her hx of cyclic vomiting syndrome.  RN told patient she would reach out to GI MD to see what he wants to do. RN also told patient someone would call her back to confirm if her appointment is cancelled or not.

## 2023-09-28 NOTE — Telephone Encounter (Signed)
[name] [name], Please contact patient. Cancel her procedure for tomorrow. She can make a routine office visit to discuss alternative strategies for adequate prep. Thanks, Dr.  Marina Goodell

## 2023-09-28 NOTE — Telephone Encounter (Signed)
Inbound call from patient, states she is really sick and prep is making her nauseas, patient is crying and anxious and would like to speak with a nurse.

## 2023-09-28 NOTE — Telephone Encounter (Signed)
RN contacted patient after receiving a message that patient had called. Patient is scheduled for colonoscopy on Thursday, 09/29/23. Patient states she took Dulcolax tablets and drank Miralax as she was instructed. Prior to starting to try to drink her Suprep today, she took Compazine as she states she had been instructed.   Patient was crying and states she is "so sick" and cannot drink the Suprep. RN told her she would reach out to the GI MD to confirm if patient needs to cancel her colonoscopy. RN told her that someone will call her back to confirm either way.

## 2023-09-28 NOTE — Telephone Encounter (Signed)
[facility] Admitting nurse has already called the pt and cancelled the appt and instructed her to call back to schedule an office visit.

## 2023-09-28 NOTE — Telephone Encounter (Signed)
See additional phone note. 

## 2023-09-28 NOTE — Telephone Encounter (Signed)
RN contacted patient to let her know that Dr. Marina Goodell said her colonoscopy scheduled for 09/29/23 has been cancelled. RN told patient that after she feels better, Dr. Marina Goodell would like for her to schedule an office visit with him to discuss options for colonoscopy prep so that patient can have procedure.

## 2023-09-29 ENCOUNTER — Encounter: Payer: 59 | Admitting: Internal Medicine

## 2023-10-11 ENCOUNTER — Other Ambulatory Visit: Payer: Self-pay

## 2023-10-11 DIAGNOSIS — I152 Hypertension secondary to endocrine disorders: Secondary | ICD-10-CM

## 2023-10-11 DIAGNOSIS — N184 Chronic kidney disease, stage 4 (severe): Secondary | ICD-10-CM

## 2023-10-11 DIAGNOSIS — E118 Type 2 diabetes mellitus with unspecified complications: Secondary | ICD-10-CM

## 2023-10-11 DIAGNOSIS — E1143 Type 2 diabetes mellitus with diabetic autonomic (poly)neuropathy: Secondary | ICD-10-CM

## 2023-10-11 DIAGNOSIS — E1122 Type 2 diabetes mellitus with diabetic chronic kidney disease: Secondary | ICD-10-CM

## 2023-10-11 DIAGNOSIS — R1115 Cyclical vomiting syndrome unrelated to migraine: Secondary | ICD-10-CM

## 2023-10-11 DIAGNOSIS — E1159 Type 2 diabetes mellitus with other circulatory complications: Secondary | ICD-10-CM

## 2023-10-11 DIAGNOSIS — E1142 Type 2 diabetes mellitus with diabetic polyneuropathy: Secondary | ICD-10-CM

## 2023-10-12 ENCOUNTER — Other Ambulatory Visit: Payer: Self-pay | Admitting: Family Medicine

## 2023-10-12 DIAGNOSIS — E1143 Type 2 diabetes mellitus with diabetic autonomic (poly)neuropathy: Secondary | ICD-10-CM

## 2023-10-12 DIAGNOSIS — R1115 Cyclical vomiting syndrome unrelated to migraine: Secondary | ICD-10-CM

## 2023-10-12 MED ORDER — DILTIAZEM HCL ER COATED BEADS 180 MG PO CP24
180.0000 mg | ORAL_CAPSULE | Freq: Every day | ORAL | 1 refills | Status: DC
Start: 2023-10-12 — End: 2024-04-06

## 2023-10-12 MED ORDER — CLOBETASOL PROPIONATE 0.05 % EX OINT: 1.0000 | TOPICAL_OINTMENT | Freq: Two times a day (BID) | CUTANEOUS | 2 refills | Status: DC

## 2023-10-12 MED ORDER — LINAGLIPTIN 5 MG PO TABS: 5.0000 mg | ORAL_TABLET | Freq: Every day | ORAL | 3 refills | Status: DC

## 2023-10-12 MED ORDER — LORAZEPAM 1 MG PO TABS: 1.0000 mg | ORAL_TABLET | Freq: Two times a day (BID) | ORAL | 0 refills | Status: AC | PRN

## 2023-10-12 MED ORDER — OXYCODONE HCL 5 MG PO TABS: 5.0000 mg | ORAL_TABLET | Freq: Four times a day (QID) | ORAL | 0 refills | Status: DC | PRN

## 2023-10-12 MED ORDER — TACROLIMUS 0.1 % EX OINT: TOPICAL_OINTMENT | Freq: Two times a day (BID) | CUTANEOUS | 0 refills | Status: DC

## 2023-10-12 MED ORDER — QUETIAPINE FUMARATE 50 MG PO TABS: 50.0000 mg | ORAL_TABLET | Freq: Every day | ORAL | 0 refills | Status: DC

## 2023-10-12 MED ORDER — HYDRALAZINE HCL 25 MG PO TABS: 25.0000 mg | ORAL_TABLET | Freq: Three times a day (TID) | ORAL | 1 refills | Status: DC

## 2023-10-12 MED ORDER — PROMETHAZINE HCL 25 MG PO TABS: 25.0000 mg | ORAL_TABLET | Freq: Four times a day (QID) | ORAL | 0 refills | Status: DC | PRN

## 2023-10-12 MED ORDER — LANTUS SOLOSTAR 100 UNIT/ML ~~LOC~~ SOPN
8.0000 [IU] | PEN_INJECTOR | Freq: Every day | SUBCUTANEOUS | 1 refills | Status: DC
Start: 2023-10-12 — End: 2024-06-26

## 2023-10-12 NOTE — Progress Notes (Signed)
Refills of patient's lorazepam and oxycodone sent to local pharmacy.

## 2023-10-13 ENCOUNTER — Other Ambulatory Visit: Payer: Self-pay

## 2023-10-13 NOTE — Telephone Encounter (Signed)
Please let Ms Erkkila know that her Repatha must be refilled by her cardiologist.

## 2023-10-20 ENCOUNTER — Ambulatory Visit: Payer: 59 | Admitting: Family Medicine

## 2023-10-20 ENCOUNTER — Encounter: Payer: Self-pay | Admitting: Family Medicine

## 2023-10-20 VITALS — BP 170/61 | HR 64 | Ht 61.0 in | Wt 161.2 lb

## 2023-10-20 DIAGNOSIS — Z23 Encounter for immunization: Secondary | ICD-10-CM | POA: Diagnosis not present

## 2023-10-20 DIAGNOSIS — F325 Major depressive disorder, single episode, in full remission: Secondary | ICD-10-CM

## 2023-10-20 DIAGNOSIS — F3342 Major depressive disorder, recurrent, in full remission: Secondary | ICD-10-CM

## 2023-10-20 DIAGNOSIS — Z794 Long term (current) use of insulin: Secondary | ICD-10-CM

## 2023-10-20 DIAGNOSIS — E1159 Type 2 diabetes mellitus with other circulatory complications: Secondary | ICD-10-CM

## 2023-10-20 DIAGNOSIS — L2089 Other atopic dermatitis: Secondary | ICD-10-CM

## 2023-10-20 DIAGNOSIS — J432 Centrilobular emphysema: Secondary | ICD-10-CM

## 2023-10-20 DIAGNOSIS — I152 Hypertension secondary to endocrine disorders: Secondary | ICD-10-CM

## 2023-10-20 HISTORY — DX: Major depressive disorder, single episode, in full remission: F32.5

## 2023-10-20 NOTE — Patient Instructions (Addendum)
Your blood pressure is high today.  Remember to take your Diltiazem daily and Hydralazine three times a day. Follow up about your blood pressure with Dr Nathifa Ritthaler in 4 weeks.   If you want a referral to [facility] dermatology, please let Dr Charleton Deyoung know.    Dr Peityn Payton will arrange a referral back to your eye doctor to followup the injured eye and make sure your diabetes is not settling into your eyes.   Dr Kaiden Pech recommends you start taking an Aspirin a day 81 mg to treat your peripheral artery disease.   Your Lung CAT scan showed signs of emphysema.  This comes from smoking.  If you want help with smoking cessation, please let Dr Koltyn Kelsay know.   1-800-QUIT-NOW 938-102-9950); [employer] [place] provides free cessation services to any [place] resident who needs help quitting commercial tobacco use, which includes all tobacco products offered for sale, not tobacco used for sacred and traditional ceremonies by many [place] tribes and communities. Quit Coaching is available in different forms, which can be used separately or together, to help any tobacco user give up tobacco.

## 2023-10-20 NOTE — Progress Notes (Signed)
[name] [name] is alone Sources of clinical information for visit is/are patient. Nursing assessment for this office visit was reviewed with the patient for accuracy and revision.     Previous Report(s) Reviewed: none     08/18/2023    8:49 AM  Depression screen PHQ 2/9  Decreased Interest 0  Down, Depressed, Hopeless 0  PHQ - 2 Score 0  Altered sleeping 2  Tired, decreased energy 0  Change in appetite 3  Feeling bad or failure about yourself  0  Trouble concentrating 0  Moving slowly or fidgety/restless 0  Suicidal thoughts 0  PHQ-9 Score 5   Flowsheet Row Office Visit from 08/18/2023 in [facility] Office Visit from 06/23/2023 in [facility] Clinical Support from 06/14/2023 in [facility]  Thoughts that you would be better off dead, or of hurting yourself in some way Not at all Not at all Not at all  PHQ-9 Total Score 5 4 0          06/14/2023    3:15 PM 09/16/2021    3:01 PM 06/05/2020   10:31 AM 11/16/2018    2:50 PM 07/28/2018    8:51 AM  Fall Risk   Falls in the past year? 0 0 1 0 No  Number falls in past yr: 0 0 0 0   Injury with Fall? 0 0 1 0   Follow up Falls evaluation completed;Education provided;Falls prevention discussed           08/18/2023    8:49 AM 06/23/2023    9:23 AM 06/14/2023    3:21 PM  PHQ9 SCORE ONLY  PHQ-9 Total Score 5 4 0    There are no preventive care reminders to display for this patient.  Health Maintenance Due  Topic Date Due   Zoster Vaccines- Shingrix (1 of 2) Never done   Colonoscopy  11/19/2022   DTaP/Tdap/Td (4 - Td or Tdap) 09/29/2023      History/P.E. limitations: none  There are no preventive care reminders to display for this patient.  Diabetes Health Maintenance Due  Topic Date Due   OPHTHALMOLOGY EXAM  12/11/2023   HEMOGLOBIN A1C  03/21/2024    Health Maintenance Due  Topic Date Due   Zoster Vaccines- Shingrix (1 of 2) Never done   Colonoscopy   11/19/2022   DTaP/Tdap/Td (4 - Td or Tdap) 09/29/2023     Chief Complaint  Patient presents with   Medical Management of Chronic Issues     --------------------------------------------------------------------------------------------------------------------------------------------- Visit Problem List with A/P  No problem-specific Assessment & Plan notes found for this encounter.

## 2023-10-21 ENCOUNTER — Encounter: Payer: Self-pay | Admitting: Family Medicine

## 2023-10-21 MED ORDER — CLOBETASOL PROPIONATE 0.05 % EX OINT: 1.0000 | TOPICAL_OINTMENT | Freq: Two times a day (BID) | CUTANEOUS | 2 refills | Status: DC

## 2023-10-21 MED ORDER — TACROLIMUS 0.1 % EX OINT
TOPICAL_OINTMENT | Freq: Two times a day (BID) | CUTANEOUS | 0 refills | Status: DC
Start: 2023-10-21 — End: 2023-11-28

## 2023-10-21 NOTE — Assessment & Plan Note (Signed)
Established problem Uncontrolled.  Patient is not at goal of < 140/90. [name] [name] was upset during the visit.  Will have her return visit for BP visit.  Continue current hydralazine 25 three times a day and Diltiazem 180 mg daily.   Starting her Carvedilol would be possible

## 2023-10-21 NOTE — Assessment & Plan Note (Signed)
Established problem [name] [name]'s CAT score was zero. She does not feel she has COPD. We reviewed the importance of smoking cessation. She requested consultation with Dr Raymondo Band for smoking cessation help. Will also ask Dr Raymondo Band for Spirometry testing to assess degress of respiratory impairment

## 2023-10-21 NOTE — Assessment & Plan Note (Signed)
Established problem Uncontrolled.  Patient is not at goal of symptom control. Feeling of itching and formication. Ms [name] [name] has not gone back to her dermatologist in [place] The medications that were started by the [facility] dermatologist were clobetasol and tacrolimus topical as best I can tell.  Ms [name] [name] has not been using these prescribed medications. She believes the dermatologist has moved.   Ms [name] [name] showed me a video of material she had scratched off her skin that she thought showed movement of the matter.  I was unable to see tha movement she obviously thought was occurring. Ms [name] [name]'s reporting of this distress goes back to at least May, 2021,  Ms [name] [name] feels no medical provider believes her when she says she has mites or some other small insects buried into her skin.    Exam Scattered hyperpigments macules over back and arms. Their appears to be a excoriation on left medial bony nose, near the medial epicanthus. No drainage. No erythema       I again have expressed my unfortunate position of being unable to help with her with her unpleasant experiences in her skin. While I do not doubt she sees some type of infesting organisms, I have not been able to verify from the skin and skin debris, now video of same, her observations.   Ms [name] [name] became upset when I say I cannot verify her video observations.   Ms [name] [name] is willing to seek consultation at a tertiary level of dermatological care.  A referral was made the [facility] Department of Dermatology for evaluation and treatment.   In the meantime, refills of the clobetasol and Tacrolimus topical were sent in.

## 2023-10-21 NOTE — Addendum Note (Signed)
Addended byPerley Jain, Rhealynn Myhre D on: 10/21/2023 01:21 PM   Modules accepted: Level of Service

## 2023-10-24 ENCOUNTER — Encounter: Payer: Self-pay | Admitting: Internal Medicine

## 2023-10-27 ENCOUNTER — Encounter: Payer: Self-pay | Admitting: Pharmacist

## 2023-10-27 ENCOUNTER — Ambulatory Visit (INDEPENDENT_AMBULATORY_CARE_PROVIDER_SITE_OTHER): Payer: 59 | Admitting: Pharmacist

## 2023-10-27 VITALS — Ht 63.5 in | Wt 163.2 lb

## 2023-10-27 DIAGNOSIS — Z720 Tobacco use: Secondary | ICD-10-CM

## 2023-10-27 MED ORDER — VARENICLINE TARTRATE 0.5 MG PO TABS
0.5000 mg | ORAL_TABLET | Freq: Two times a day (BID) | ORAL | 1 refills | Status: DC
Start: 2023-10-27 — End: 2024-06-26

## 2023-10-27 NOTE — Progress Notes (Signed)
   S:   Chief Complaint  Patient presents with   Medication Management    PFT and Tobacco Cessation    57 y.o. female who presents for evaluation/assistance with tobacco/marijuana dependence. Patient arrives in  good but anxious spirit and presents without any assistance. Patients reports that she does not feel like clobetasol is resolving pain. Reports pain is not resolved with ointment. Expresses concerns with use of Chantix (varenicline).   PMH is significant for T2DM, Tobacco/Marijuana user, hypertension, hyperlipidemia, chronic lower back pain, cyclical N/V.   Patient was referred and last seen by Primary Care Provider, Dr. McDiarmid, on 10/20/23.   At last visit, Spirometry testing with Dr. Raymondo Band was reccommended to assess degrees of respiratory impairment and smoking cessation help.   Age when started using tobacco on a daily basis 17. Brand smoked Newport 100's. Number of cigarettes/day 7-8  Estimated nicotine content per cigarette (mg) 0.5.  Marijuana Joints smoked in a month: 20, inconstantly having 3 a day.  Estimated nicotine intake per day 4 mg.   Denies waking to smoke / Smokes 0 times per night.    Patient reports breathing has been good.   Medication adherence reported good   Medications used in past cessation efforts include: Patches (allergic reaction due to tape) Most common triggers to use tobacco include; chronic back pain   Motivation to quit: Grandchildren  See "scanned report" or Documentation Flowsheet (discrete results - PFTs) for Spirometry results. Patient provided good effort while attempting spirometry.   Lung Age = 56 yo  O:  Review of Systems  Gastrointestinal:  Positive for nausea and vomiting.       Cyclical bouts of N/V  Musculoskeletal:  Positive for back pain.    Physical Exam Constitutional:      Appearance: Normal appearance.  Neurological:     Mental Status: She is alert.  Psychiatric:        Mood and Affect: Mood normal.         Behavior: Behavior normal.        Thought Content: Thought content normal.        Judgment: Judgment normal.    Patient is participating in a Managed Medicaid Plan:  Yes   A/P: Tobacco use disorder with mild nicotine dependence of 40 years duration in a patient who is good candidate for success because of 57 yo lung age on spirometry and current motivation to quit.    -Goal: Reducing Marijuana to 1-2 joints/day when she decides to smoke. -Goal: 4 or less cigarettes a day by next visit -Goal: look into joining the gym -Initiated varenicline 0.5 mg by mouth once daily with food x7 days, then 0.5 mg by mouth twice daily with food thereafter. Patient counseled on purpose, proper use, and potential adverse effects, including GI upset.  Spirometry reveals mild obstruction with ratio of ~ 70% for FEV1 and FVC.  Patient interested in tobacco cessation.  No current inhalation therapy. No additional respiratory medication therapy at this time.  -Reviewed results of pulmonary function tests.  Pt verbalized understanding of results and education.    Written patient instructions provided. Patient verbalized understanding of treatment plan.  Total time in face to face counseling 27 minutes.    Follow-up:  Pharmacist Dr. Raymondo Band, PharmD on 11/17/2023 at 9:30 am PCP clinic visit with Dr. Perley Jain, MD on 11/17/2023 at 11:00 am Patient seen with Caprice Beaver, PharmD Candidate

## 2023-10-27 NOTE — Assessment & Plan Note (Signed)
Tobacco use disorder with mild nicotine dependence of 40 years duration in a patient who is good candidate for success because of 57 yo lung age on spirometry and current motivation to quit.    -Goal: Reducing Marijuana to 1-2 joints/day when she decides to smoke. -Goal: 4 or less cigarettes a day by next visit -Goal: look into joining the gym -Initiated varenicline 0.5 mg by mouth once daily with food x7 days, then 0.5 mg by mouth twice daily with food thereafter. Patient counseled on purpose, proper use, and potential adverse effects, including GI upset.  Spirometry reveals mild obstruction with ratio of ~ 70% for FEV1 and FVC.  Patient interested in tobacco cessation.  No current inhalation therapy. No additional respiratory medication therapy at this time.  -Reviewed results of pulmonary function tests.  Pt verbalized understanding of results and education.

## 2023-10-27 NOTE — Progress Notes (Signed)
Reviewed and agree with Dr Koval's plan.   

## 2023-10-27 NOTE — Patient Instructions (Addendum)
Nice to see you today!    Medication Changes: START Chantix 0.5 mg by mouth once daily with food for 7 days. Then, if you can tolerate it, take 0.5 mg by mouth twice daily with food thereafter after. DO NOT TAKE ON AN EMPTY STOMACH  Continue all other medication the same.   Tobacco Patient Instructions  Quitting smoking is one of the most important decisions you can make for your current and future health. Consider what you dislike about smoking and how quitting could personally benefit you. Try to cut down. Aim for reducing the amount you smoke by 4 over the next 3 weeks before next visit.  My target quit date is: 4 cigarettes by 11/17/2023  Starting today, Be a Quitter!  Remind yourself why you want to quit.  Delay your first cigarette of the day for as long as possible.  Start cleaning out all pockets, drawers, and your car of cigarettes.  Getting Through the Cravings Once You Are Smoke Free: Each craving will last about 10 minutes, whether or not you smoke. Here's how to get through the cravings without cigarettes:  DELAY: Tell yourself that you'll wait for the next craving. Do it every time! DEEP BREATHS: One reason smoking feels good is because you breathe in deeply to inhale. Take four slow, deep breaths and feel the relaxation without the hamful effects of cigarettes. DRINK WATER: Drink a glass of cool water. It will give your hands and mouth something to do and will help flush the nicotine out of your system faster. DIVERT: Do something else -- brush your teeth, take a walk, call a friend who can offer you support. Just moving onto something other than thinking about cigarettes will move you through the craving.   Frequently Asked Questions  What can I do when I get the urge to smoke? To get through the urge to smoke, try the following:  Review your reasons for quitting and think of all the benefits to your health, your finances, and your family.  Remind yourself that there  is no such thing as just one cigarette -- or even one puff.  Ride out the desire to smoke. Use the 4 Os -- Delay, Deep Breaths, Drink Water and Divert to get you through. The craving will go away eventually. Do not fool yourself into thinking you can have just one cigarette.  Any tips on how to deal with stress? Stress is a natural part of life. The key is to deal with it without reaching for a cigarette. Taking deep breaths, counting backwards from 10 and asking yourself 1-how big a deal is this?"  Writing down your feelings, talking with a friend and doing things like positive self-talk and meditation are some other ways that people deal with daily stress.  What if I start smoking again? Slips happen. Most people try to quit smoking a few times before they are successful. Don't beat yourself up if this happens to you! Ask yourself if this was a slip or a relapse. A slip is a one-time mistake that is quickly corrected. A relapse is going back to your old smoking habits.   If you slip, don't give up. Think of it as a learning experience. Ask yourself what went wrong and renew your commitment to staying away from smoking for good.  If you relapse, try not to get discouraged. Ask yourself the question "What caused me to start smoking?" Figure out what helped you and what didn't when you tried  to quit. Knowing why you relapsed is useful information for your next attempt to quit.

## 2023-11-10 ENCOUNTER — Other Ambulatory Visit: Payer: Self-pay

## 2023-11-10 DIAGNOSIS — E1143 Type 2 diabetes mellitus with diabetic autonomic (poly)neuropathy: Secondary | ICD-10-CM

## 2023-11-10 DIAGNOSIS — R1115 Cyclical vomiting syndrome unrelated to migraine: Secondary | ICD-10-CM

## 2023-11-10 MED ORDER — OXYCODONE HCL 5 MG PO TABS: 5.0000 mg | ORAL_TABLET | Freq: Four times a day (QID) | ORAL | 0 refills | Status: DC | PRN

## 2023-11-17 ENCOUNTER — Encounter: Payer: Self-pay | Admitting: Family Medicine

## 2023-11-17 ENCOUNTER — Encounter: Payer: Self-pay | Admitting: Pharmacist

## 2023-11-17 ENCOUNTER — Ambulatory Visit: Payer: 59 | Admitting: Pharmacist

## 2023-11-17 ENCOUNTER — Ambulatory Visit: Payer: 59 | Admitting: Family Medicine

## 2023-11-17 VITALS — BP 173/71 | HR 89 | Ht 61.0 in | Wt 158.5 lb

## 2023-11-17 DIAGNOSIS — M25551 Pain in right hip: Secondary | ICD-10-CM

## 2023-11-17 DIAGNOSIS — E1159 Type 2 diabetes mellitus with other circulatory complications: Secondary | ICD-10-CM | POA: Diagnosis not present

## 2023-11-17 DIAGNOSIS — Z720 Tobacco use: Secondary | ICD-10-CM

## 2023-11-17 DIAGNOSIS — I10 Essential (primary) hypertension: Secondary | ICD-10-CM

## 2023-11-17 DIAGNOSIS — M25552 Pain in left hip: Secondary | ICD-10-CM

## 2023-11-17 DIAGNOSIS — M7062 Trochanteric bursitis, left hip: Secondary | ICD-10-CM

## 2023-11-17 DIAGNOSIS — I152 Hypertension secondary to endocrine disorders: Secondary | ICD-10-CM

## 2023-11-17 MED ORDER — METHYLPREDNISOLONE ACETATE 40 MG/ML IJ SUSP
40.0000 mg | Freq: Once | INTRAMUSCULAR | Status: DC
  Administered 2023-11-17: 40 mg via INTRAMUSCULAR

## 2023-11-17 MED ORDER — CARVEDILOL 12.5 MG PO TABS
12.5000 mg | ORAL_TABLET | Freq: Two times a day (BID) | ORAL | Status: DC
Start: 2023-11-17 — End: 2023-11-29

## 2023-11-17 NOTE — Patient Instructions (Addendum)
Nice to see you today!  Medication Changes:  Increase varenicline (Chantix) to 0.5 mg TWICE daily with meals.  Continue all other medication the same.   Tobacco Patient Instructions  Quitting smoking is one of the most important decisions you can make for your current and future health. Consider what you dislike about smoking and how quitting could personally benefit you. Try to cut down. Aim for reducing the amount you smoke by 1 over the next 3 weeks.  Goal: 4 cigarettes or less by next visit.  Starting today, Be a Quitter!  Remind yourself why you want to quit.  Delay your first cigarette of the day for as long as possible.  Start cleaning out all pockets, drawers, and your car of cigarettes.  Getting Through the Cravings Once You Are Smoke Free: Each craving will last about 10 minutes, whether or not you smoke. Here's how to get through the cravings without cigarettes:  DELAY: Tell yourself that you'll wait for the next craving. Do it every time! DEEP BREATHS: One reason smoking feels good is because you breathe in deeply to inhale. Take four slow, deep breaths and feel the relaxation without the hamful effects of cigarettes. DRINK WATER: Drink a glass of cool water. It will give your hands and mouth something to do and will help flush the nicotine out of your system faster. DIVERT: Do something else -- brush your teeth, take a walk, call a friend who can offer you support. Just moving onto something other than thinking about cigarettes will move you through the craving.   Frequently Asked Questions  What can I do when I get the urge to smoke? To get through the urge to smoke, try the following:  Review your reasons for quitting and think of all the benefits to your health, your finances, and your family.  Remind yourself that there is no such thing as just one cigarette -- or even one puff.  Ride out the desire to smoke. Use the 4 Os -- Delay, Deep Breaths, Drink Water and  Divert to get you through. The craving will go away eventually. Do not fool yourself into thinking you can have just one cigarette.  Any tips on how to deal with stress? Stress is a natural part of life. The key is to deal with it without reaching for a cigarette. Taking deep breaths, counting backwards from 10 and asking yourself 1-how big a deal is this?"  Writing down your feelings, talking with a friend and doing things like positive self-talk and meditation are some other ways that people deal with daily stress.  What if I start smoking again? Slips happen. Most people try to quit smoking a few times before they are successful. Don't beat yourself up if this happens to you! Ask yourself if this was a slip or a relapse. A slip is a one-time mistake that is quickly corrected. A relapse is going back to your old smoking habits.   If you slip, don't give up. Think of it as a learning experience. Ask yourself what went wrong and renew your commitment to staying away from smoking for good.  If you relapse, try not to get discouraged. Ask yourself the question "What caused me to start smoking?" Figure out what helped you and what didn't when you tried to quit. Knowing why you relapsed is useful information for your next attempt to quit.

## 2023-11-17 NOTE — Progress Notes (Signed)
S:   Chief Complaint  Patient presents with   Medication Management    Tobacco intake reduction / cessation planning   57 y.o. female who presents for evaluation/assistance with tobacco dependence.  PMH is significant for T2DM, Tobacco/Marijuana user, hypertension, hyperlipidemia, chronic lower back pain, cyclical N/V. Marland Kitchen   Patient was referred and last seen by Primary Care Provider, Dr. McDiarmid, earlier today (vitals deferred in our visit) 11/17/23.   At last visit, started Chantix (varenicline) 0.5 mg daily. Patient has not had any episodes of nausea/vomiting.  Since last visit, patient has only bought two packs of cigarettes last week. She is down to 5 cigarettes daily. She endorses her cravings have lessened significantly. She may take out a cigarette but leave it and forget to light it right away or not always finishing the entire cigarette.   Her marijuana intake has also decreased. She goes days without smoking marijuana as she reports cravings have decreased. Appetite has remained the same-- she does not get hungry until 2-3 PM and does not like to eat in the mornings or she will get sick. she expresses it is a goal of hers to work up more of an appetite.   Age when started using tobacco on a daily basis 17. Brand smoked Newport 100's. Number of cigarettes/day 5 Estimated nicotine content per cigarette (mg) 0.5.  Marijuana Joints smoked in a month: 20, now having 1-2 joints/day  Estimated nicotine intake per day <5 mg.   Denies waking to smoke / Smokes 0 times per night.   Most recent quit attempt last year Longest time ever been tobacco free 1 year -- relapsed when family member brought a cigarette into her house and she smelled it.  Medications used in past cessation efforts include: nicotine patches (allergic reaction to adhesive)  Rates IMPORTANCE of quitting tobacco on 1-10 scale of 10. Rates CONFIDENCE of quitting tobacco on 1-10 scale of 8.  Motivation to quit:  grandchildren, she is "just ready to be done" and that quitting has been a "fun"/positive experience, spirometry results (64 year lung age), embarrassment when around non-smokers  O: Clinical ASCVD: No  The ASCVD Risk score (Arnett DK, et al., 2019) failed to calculate for the following reasons:   The valid total cholesterol range is 130 to 320 mg/dL   Unable to determine if patient is Non-Hispanic African American  Review of Systems  All other systems reviewed and are negative.   Physical Exam Pulmonary:     Effort: Pulmonary effort is normal.  Neurological:     Mental Status: She is alert.  Psychiatric:        Mood and Affect: Mood normal.        Behavior: Behavior normal.        Thought Content: Thought content normal.     Patient is participating in a Managed Medicaid Plan:  Yes  A/P: Tobacco use disorder with mild nicotine dependence of 40 years duration in a patient who is excellent candidate for success because of her strong desire to quit. -- Increased varenicline (Chantix) to 0.5 mg twice daily with meals. Encouraged only to take with a full meal.  -- Goal: reducing cigarette intake to 4 or less by next visit -- Goal: no smoking in the house, purchasing cigarette brands that are not as enjoyable/cheaper -- Provided information on 1 800-QUIT NOW support program.   Written patient instructions provided. Patient verbalized understanding of treatment plan.  Total time in face to face counseling 24  minutes.    Follow-up:  Pharmacist 12/08/23 PCP clinic PRN Patient seen with Lendon Ka, PharmD Candidate.

## 2023-11-17 NOTE — Assessment & Plan Note (Signed)
Tobacco use disorder with mild nicotine dependence of 40 years duration in a patient who is excellent candidate for success because of her strong desire to quit. -- Increased varenicline (Chantix) to 0.5 mg twice daily with meals. Encouraged only to take with a full meal.  -- Goal: reducing cigarette intake to 4 or less by next visit -- Goal: no smoking in the house, purchasing cigarette brands that are not as enjoyable/cheaper -- Provided information on 1 800-QUIT NOW support program.

## 2023-11-17 NOTE — Progress Notes (Signed)
[name] [name] is alone Sources of clinical information for visit is/are patient. Nursing assessment for this office visit was reviewed with the patient for accuracy and revision.     Previous Report(s) Reviewed: none     08/18/2023    8:49 AM  Depression screen PHQ 2/9  Decreased Interest 0  Down, Depressed, Hopeless 0  PHQ - 2 Score 0  Altered sleeping 2  Tired, decreased energy 0  Change in appetite 3  Feeling bad or failure about yourself  0  Trouble concentrating 0  Moving slowly or fidgety/restless 0  Suicidal thoughts 0  PHQ-9 Score 5   Flowsheet Row Office Visit from 08/18/2023 in [facility] [place]. [facility] Office Visit from 06/23/2023 in [facility]. [facility] Clinical Support from 06/14/2023 in [facility] Of [place]. [facility]  Thoughts that you would be better off dead, or of hurting yourself in some way Not at all Not at all Not at all  PHQ-9 Total Score 5 4 0          06/14/2023    3:15 PM 09/16/2021    3:01 PM 06/05/2020   10:31 AM 11/16/2018    2:50 PM 07/28/2018    8:51 AM  Fall Risk   Falls in the past year? 0 0 1 0 No  Number falls in past yr: 0 0 0 0   Injury with Fall? 0 0 1 0   Follow up Falls evaluation completed;Education provided;Falls prevention discussed           08/18/2023    8:49 AM 06/23/2023    9:23 AM 06/14/2023    3:21 PM  PHQ9 SCORE ONLY  PHQ-9 Total Score 5 4 0    There are no preventive care reminders to display for this patient.  Health Maintenance Due  Topic Date Due   Zoster Vaccines- Shingrix (1 of 2) Never done   Colonoscopy  11/19/2022   DTaP/Tdap/Td (4 - Td or Tdap) 09/29/2023      History/P.E. limitations: none  There are no preventive care reminders to display for this patient.  Diabetes Health Maintenance Due  Topic Date Due   OPHTHALMOLOGY EXAM  12/11/2023   HEMOGLOBIN A1C  03/21/2024     Health Maintenance Due  Topic Date Due   Zoster Vaccines- Shingrix (1 of 2) Never done   Colonoscopy  11/19/2022   DTaP/Tdap/Td (4 - Td or Tdap) 09/29/2023     No chief complaint on file.    --------------------------------------------------------------------------------------------------------------------------------------------- Visit Problem List with A/P  No problem-specific Assessment & Plan notes found for this encounter.

## 2023-11-17 NOTE — Patient Instructions (Addendum)
Your blood pressure is not at goal.  Ideally, we want your blood pressure less than 130 / 80 to best protect your kidneys.   Restart Carvedilol for blood pressure.  Take 12.5 mg twice a day of carvedilol.  If you take it at the same time you take both your diltiazem and hydralazine, watch for dizziness with standing from too low a blood pressure.   We will see how well this combination of blood pressure medications are working for you with a office visit after the holidays.   You received a steroid injection into the bursa of your left hip for trochanteric bursitis.

## 2023-11-18 ENCOUNTER — Encounter: Payer: Self-pay | Admitting: Family Medicine

## 2023-11-18 DIAGNOSIS — M7062 Trochanteric bursitis, left hip: Secondary | ICD-10-CM

## 2023-11-18 HISTORY — DX: Trochanteric bursitis, left hip: M70.62

## 2023-11-18 MED ORDER — METHYLPREDNISOLONE ACETATE 40 MG/ML IJ SUSP: 40.0000 mg | Freq: Once | INTRAMUSCULAR | Status: DC

## 2023-11-18 NOTE — Assessment & Plan Note (Signed)
Established problem Uncontrolled.  Patient is not at goal of BP <130/80. Start: Carvedilol 12.5 mg BID Continue: Hydralazine 25 mg twice a day, Diltiazem 24hr cap, one daily RTC 8 weeks for re-evaluation

## 2023-11-18 NOTE — Assessment & Plan Note (Signed)
New problem Onset: 1-2 weeks ago Location: left hip on side Quality: aching, buring Severity: interfering with sleep and daily activities Course: unchanged Radiation: down leg Relief: APAP temporarily Precipitant: none recalled Trauma (Acute or Chronic): none  Assessment and Plan Acute left greater trochanteric bursitis Procedure Diagnosis: Acute left greater trochanteric bursitis Operator: Tawanna Cooler Chayah Mckee, MD Procedure: Kateri Mc injection with corticosteroids Consent: yes Site: Left greater trochanter Prep: betadine and alcohol Material: 40 mg Solumedrol with 4 CC Lido 1% with Epi Instruments: 1.5 inch 25 gauge needle Complications: none  Discussed after care and signs/sympotms to monitor for infection or bleeding

## 2023-11-18 NOTE — Progress Notes (Signed)
Reviewed and agree with Dr Koval's plan.   

## 2023-11-28 ENCOUNTER — Other Ambulatory Visit: Payer: Self-pay | Admitting: Family Medicine

## 2023-11-28 DIAGNOSIS — L2089 Other atopic dermatitis: Secondary | ICD-10-CM

## 2023-11-29 ENCOUNTER — Other Ambulatory Visit: Payer: Self-pay | Admitting: Family Medicine

## 2023-11-29 ENCOUNTER — Telehealth: Payer: Self-pay

## 2023-11-29 ENCOUNTER — Other Ambulatory Visit: Payer: Self-pay

## 2023-11-29 DIAGNOSIS — I10 Essential (primary) hypertension: Secondary | ICD-10-CM

## 2023-11-30 MED ORDER — CARVEDILOL 12.5 MG PO TABS: 12.5000 mg | ORAL_TABLET | Freq: Two times a day (BID) | ORAL | 3 refills | Status: DC

## 2023-11-30 NOTE — Telephone Encounter (Signed)
[name] [name] received Prescription #15 tab in August.  She will need to be seen before further refills.

## 2023-12-08 ENCOUNTER — Ambulatory Visit: Payer: 59 | Admitting: Pharmacist

## 2023-12-09 ENCOUNTER — Other Ambulatory Visit: Payer: Self-pay

## 2023-12-09 ENCOUNTER — Telehealth: Payer: Self-pay

## 2023-12-09 DIAGNOSIS — I10 Essential (primary) hypertension: Secondary | ICD-10-CM

## 2023-12-09 DIAGNOSIS — E1143 Type 2 diabetes mellitus with diabetic autonomic (poly)neuropathy: Secondary | ICD-10-CM

## 2023-12-09 DIAGNOSIS — R1115 Cyclical vomiting syndrome unrelated to migraine: Secondary | ICD-10-CM

## 2023-12-09 MED ORDER — LORAZEPAM 1 MG PO TABS: 1.0000 mg | ORAL_TABLET | Freq: Three times a day (TID) | ORAL | 0 refills | Status: DC | PRN

## 2023-12-09 MED ORDER — OXYCODONE HCL 5 MG PO TABS: 5.0000 mg | ORAL_TABLET | Freq: Four times a day (QID) | ORAL | 0 refills | Status: DC | PRN

## 2023-12-09 NOTE — Telephone Encounter (Signed)
Patient calls nurse line requesting refill on Lorazepam.   This is not on current medication list.   If appropriate, please send refill to [facility] on [address].   Veronda Prude, RN

## 2023-12-15 ENCOUNTER — Ambulatory Visit: Payer: 59 | Admitting: Pharmacist

## 2023-12-15 ENCOUNTER — Encounter: Payer: Self-pay | Admitting: Pharmacist

## 2023-12-15 VITALS — BP 167/74 | Wt 159.4 lb

## 2023-12-15 DIAGNOSIS — Z720 Tobacco use: Secondary | ICD-10-CM

## 2023-12-15 NOTE — Progress Notes (Signed)
   S:   Chief Complaint  Patient presents with   Medication Management    Tobacco Intake Reduction / Cessation   57 y.o. female who presents for evaluation/assistance with tobacco dependence.  PMH is significant for T2DM, Tobacco/Marijuana user, hypertension, hyperlipidemia, chronic lower back pain, cyclical N/V  Patient was referred and last seen by Primary Care Provider, Dr. Perley Jain, on 11/17/2023.  I also saw patient on 11/17/2023  At last visit, varenicline dose was increased from 0.5mg  daily to 0.5mg  BID with food.   Patient arrives today with complaints of increased GI symptoms including increased GI symptoms including; nausea, 1 episode of significant vomiting, and abdominal pain.  She believes thjs episode of GI symptoms may be related to increasing varenicline.   Reports with increase in pain and GI symptoms she has failed to make goal progress with tobacco intake reduction plan.  She reports smoking ~ 5 per day (previously reported as 7-8).    O:   Review of Systems  Gastrointestinal:  Positive for abdominal pain and nausea.    Physical Exam Pulmonary:     Effort: Pulmonary effort is normal.  Neurological:     Mental Status: She is alert. Mental status is at baseline.  Psychiatric:        Mood and Affect: Mood normal.        Thought Content: Thought content normal.   Patient is participating in a Managed Medicaid Plan:  Yes   A/P: Tobacco use disorder with mild nicotine dependence of 40 years duration in a patient who is currently experiencing increased GI symptoms.  History of cyclic vomiting syndrome and dose increase of varenicline dose may be possible causes.  - Instructed to STOP varenicline - when GI symptoms resolve, we can consider restarting 1 tablet dail which the patient tolerated prior to dose increase.  - Goal remains: reducing cigarette intake to 4 or less by next visit  Chronic GI symptoms/abdominal pain. Duration > 10 days, and patient feels  frustration but also believes that her symptoms are slightly better than last week, when she cancelled her appointment with me. Patient has previously scheduled follow-up with Dr. McDiarmid in ~ 1 week.  Patient encouraged to return to clinic sooner if GI symptoms worsen in severity   Written patient instructions provided. Patient verbalized understanding of treatment plan.  Total time in face to face counseling 21 minutes.    Follow-up:  Pharmacist PRN - per PCP visit suggestion 12/21 visit PCP clinic visit Dr. McDiarmid in ~ 1 week.

## 2023-12-15 NOTE — Progress Notes (Signed)
Reviewed and agree with Dr Koval's plan.   

## 2023-12-15 NOTE — Patient Instructions (Addendum)
Nice to see you today!  Medication Changes: STOP Varenicline now  Restart 1 per day WITH food only after your nausea/pain is gone.

## 2023-12-15 NOTE — Assessment & Plan Note (Signed)
Tobacco use disorder with mild nicotine dependence of 40 years duration in a patient who is currently experiencing increased GI symptoms.  History of cyclic vomiting syndrome and dose increase of varenicline dose may be possible causes.  - Instructed to STOP varenicline - when GI symptoms resolve, we can consider restarting 1 tablet dail which the patient tolerated prior to dose increase.  - Goal remains: reducing cigarette intake to 4 or less by next visit

## 2023-12-19 ENCOUNTER — Telehealth: Payer: Self-pay

## 2023-12-19 MED ORDER — TRAMADOL HCL 50 MG PO TABS: 50.0000 mg | ORAL_TABLET | Freq: Three times a day (TID) | ORAL | 0 refills | Status: AC | PRN

## 2023-12-19 NOTE — Telephone Encounter (Signed)
Tramadol 50 mg tab, #15 with no refills.

## 2023-12-22 ENCOUNTER — Encounter: Payer: Self-pay | Admitting: Family Medicine

## 2023-12-22 ENCOUNTER — Ambulatory Visit: Payer: 59 | Admitting: Family Medicine

## 2024-01-03 ENCOUNTER — Other Ambulatory Visit (HOSPITAL_BASED_OUTPATIENT_CLINIC_OR_DEPARTMENT_OTHER): Payer: Self-pay

## 2024-01-03 ENCOUNTER — Other Ambulatory Visit: Payer: Self-pay

## 2024-01-03 ENCOUNTER — Encounter: Payer: Self-pay | Admitting: Family Medicine

## 2024-01-03 ENCOUNTER — Ambulatory Visit (INDEPENDENT_AMBULATORY_CARE_PROVIDER_SITE_OTHER): Payer: 59 | Admitting: Family Medicine

## 2024-01-03 ENCOUNTER — Other Ambulatory Visit (HOSPITAL_COMMUNITY): Payer: Self-pay

## 2024-01-03 VITALS — BP 141/53 | HR 65 | Temp 98.2°F | Ht 61.0 in | Wt 159.4 lb

## 2024-01-03 DIAGNOSIS — R051 Acute cough: Secondary | ICD-10-CM | POA: Diagnosis not present

## 2024-01-03 DIAGNOSIS — G8929 Other chronic pain: Secondary | ICD-10-CM | POA: Diagnosis not present

## 2024-01-03 DIAGNOSIS — M545 Low back pain, unspecified: Secondary | ICD-10-CM | POA: Diagnosis not present

## 2024-01-03 MED ORDER — BENZONATATE 100 MG PO CAPS
100.0000 mg | ORAL_CAPSULE | Freq: Two times a day (BID) | ORAL | 0 refills | Status: DC | PRN
  Filled 2024-01-03: qty 20, 10d supply, fill #0

## 2024-01-03 MED ORDER — BACLOFEN 5 MG PO TABS
5.0000 mg | ORAL_TABLET | Freq: Two times a day (BID) | ORAL | 0 refills | Status: DC | PRN
  Filled 2024-01-03: qty 14, 7d supply, fill #0
  Filled 2024-01-03: qty 15, 8d supply, fill #0

## 2024-01-03 NOTE — Progress Notes (Signed)
    SUBJECTIVE:   CHIEF COMPLAINT / HPI:   Cough This is a new problem. Episode onset: Cough x 1 week. The problem has been waxing and waning. Associated symptoms include nasal congestion. Pertinent negatives include no chest pain, fever, shortness of breath or wheezing. Nothing aggravates the symptoms. Treatments tried: Honey and Lemon. The treatment provided mild relief.    Hip & Back pain: She has chronic left hip pain for which she is on Oxy. However, about 2 weeks ago, she pulled her right back muscle and has been having right lower back pain which has now improved some. No LL weakness or numbness. Wanted to get something for pain in addition to her current regimen.  PERTINENT  PMH / PSH: PMHx reviewed  OBJECTIVE:   BP (!) 141/53   Pulse 65   Temp 98.2 F (36.8 C) (Oral)   Ht 5' 1 (1.549 m)   Wt 159 lb 6 oz (72.3 kg)   SpO2 100%   BMI 30.11 kg/m   Physical Exam Vitals and nursing note reviewed.  Cardiovascular:     Rate and Rhythm: Normal rate and regular rhythm.     Heart sounds: Normal heart sounds. No murmur heard. Pulmonary:     Effort: Pulmonary effort is normal. No respiratory distress.     Breath sounds: Normal breath sounds. No wheezing.  Musculoskeletal:     Lumbar back: Normal.  Neurological:     General: No focal deficit present.     Gait: Gait is intact.      ASSESSMENT/PLAN:   Chronic low back pain Chronic low back pain and left hip pain She had a flare of her low back pain which is now improving Continue current home pain regimen Baclofen  escribed prn pain and spasm F/U with PCP soon for reassessment   Cough: Benign pulm exam Likely viral cough given hx of exposure to sick family members recently COVID-19 and influenza testing offered today but she declined Tessalon  pearl prn cough escribed Consider chest xray in the future if there is no improvement She agreed with the plan  Otto Fairly, MD [facility]

## 2024-01-03 NOTE — Patient Instructions (Addendum)
 I am sorry about your cough. I sent in Tessalon  pearl as needed for cough to the pharmacy. If this continues, we can get an xray. I also sent in muscle relaxant for your back pain. Do not use this muscle relaxant with your Lorazepam  as it causes drowsiness as well. Please go to [facility] pharmacy to pick up your medication: Address: [address] [place], [place], [place] [address] Hours:  Open ? Closes 6?PM Phone: [phone]

## 2024-01-03 NOTE — Assessment & Plan Note (Signed)
 Chronic low back pain and left hip pain She had a flare of her low back pain which is now improving Continue current home pain regimen Baclofen escribed prn pain and spasm F/U with PCP soon for reassessment

## 2024-01-09 ENCOUNTER — Other Ambulatory Visit: Payer: Self-pay | Admitting: Family Medicine

## 2024-01-09 ENCOUNTER — Telehealth: Payer: Self-pay

## 2024-01-09 ENCOUNTER — Encounter: Payer: Self-pay | Admitting: Cardiovascular Disease

## 2024-01-09 ENCOUNTER — Ambulatory Visit: Payer: 59 | Attending: Cardiovascular Disease | Admitting: Cardiovascular Disease

## 2024-01-09 VITALS — BP 144/60 | HR 89 | Ht 61.0 in | Wt 157.2 lb

## 2024-01-09 DIAGNOSIS — E1143 Type 2 diabetes mellitus with diabetic autonomic (poly)neuropathy: Secondary | ICD-10-CM

## 2024-01-09 DIAGNOSIS — E782 Mixed hyperlipidemia: Secondary | ICD-10-CM | POA: Diagnosis not present

## 2024-01-09 DIAGNOSIS — Z720 Tobacco use: Secondary | ICD-10-CM | POA: Diagnosis not present

## 2024-01-09 DIAGNOSIS — R1115 Cyclical vomiting syndrome unrelated to migraine: Secondary | ICD-10-CM

## 2024-01-09 DIAGNOSIS — I10 Essential (primary) hypertension: Secondary | ICD-10-CM | POA: Diagnosis not present

## 2024-01-09 DIAGNOSIS — I739 Peripheral vascular disease, unspecified: Secondary | ICD-10-CM

## 2024-01-09 MED ORDER — OXYCODONE HCL 5 MG PO TABS: 5.0000 mg | ORAL_TABLET | Freq: Four times a day (QID) | ORAL | 0 refills | Status: DC | PRN

## 2024-01-09 NOTE — Assessment & Plan Note (Signed)
 History of hyperlipidemia on Repatha with lipid profile performed 03/04/2023 revealing total cholesterol 117, LDL 36 and HDL of 69.

## 2024-01-09 NOTE — Assessment & Plan Note (Signed)
 Ongoing tobacco use of 4 cigarettes a day attempting to stop.  She was put on Chantix but had side effects.

## 2024-01-09 NOTE — Patient Instructions (Signed)
 Medication Instructions:  Your physician recommends that you continue on your current medications as directed. Please refer to the Current Medication list given to you today.  *If you need a refill on your cardiac medications before your next appointment, please call your pharmacy*   Follow-Up: At [facility], you and your health needs are our priority.  As part of our continuing mission to provide you with exceptional heart care, we have created designated Provider Care Teams.  These Care Teams include your primary Cardiologist (physician) and Advanced Practice Providers (APPs -  Physician Assistants and Nurse Practitioners) who all work together to provide you with the care you need, when you need it.  We recommend signing up for the patient portal called "MyChart".  Sign up information is provided on this After Visit Summary.  MyChart is used to connect with patients for Virtual Visits (Telemedicine).  Patients are able to view lab/test results, encounter notes, upcoming appointments, etc.  Non-urgent messages can be sent to your provider as well.   To learn more about what you can do with MyChart, go to [email or web address].    Your next appointment:   6 month(s)  Provider:   Bernadene Person, NP       Then, Nanetta Batty, MD will plan to see you again in 12 month(s).    Other Instructions

## 2024-01-09 NOTE — Assessment & Plan Note (Signed)
 History of peripheral arterial disease with Dopplers performed 11/05/2022 revealed a right ABI of 0.86 and left of 0.65.  She did have moderate disease on the right and high-grade proximal left SFA stenosis.  She has a serum creatinine in the 3 range and we talked about the possibility of radiocontrast nephropathy.  This point her claudication is not lifestyle limiting.  Should she go on dialysis, she would be a candidate for intervention.

## 2024-01-09 NOTE — Telephone Encounter (Signed)
 Prescription sent

## 2024-01-09 NOTE — Progress Notes (Signed)
 01/09/2024 [name] [name]   1966/12/17  [id number]  Primary Physician McDiarmid, Krystal BIRCH, MD Primary Cardiologist: Dorn JINNY Lesches MD GENI [place], [place], FSCAI  HPI:  [name] [name] is a 58 y.o.   mildly overweight single African-American female mother of 4 children, grandmother of 23 grandchildren who is currently disabled because of vomiting. She was initially referred by hospital emergency room because of atypical chest pain.  I last saw her in the office 11/11/2022.  Risk factors include continued tobacco abuse 4 cigarettes per day having smoked approximately 10-15 pack years. She has history of hypertension, diabetes and untreated hyperlipidemia. Her mother did die of myocardial infarction age 52. She had cardiac catheterization performed over 10 years ago that showed normal coronary arteries. She gets occasional atypical chest pain occurring every other month or so which is brief, sharp and nonischemic.   Unfortunately, she lost her youngest son since I last saw her of a gunshot wound.   Since I saw her in the office 3 months ago she continues to complain of nausea and vomiting.  We talked about her peripheral arterial disease.  At this point, it is not lifestyle limiting.  She does have a left ABI of 0.65 with what appears to be a subtotally occluded left SFA.  Because of her serum creatinine in the 3 range she is not a candidate for angiography intervention however should she go on dialysis this can then be addressed endovascularly.   Current Meds  Medication Sig   Baclofen  5 MG TABS Take 1 tablet (5 mg total) by mouth 2 (two) times daily as needed for muscle spasms. Please don't use with your Lorazepam    benzonatate  (TESSALON ) 100 MG capsule Take 1 capsule (100 mg total) by mouth 2 (two) times daily as needed for cough. Keep away from children   carvedilol  (COREG ) 12.5 MG tablet Take 1 tablet (12.5 mg total) by mouth 2 (two) times daily with a meal.   diltiazem  (CARDIZEM  CD) 180 MG  24 hr capsule Take 1 capsule (180 mg total) by mouth daily.   hydrALAZINE  (APRESOLINE ) 25 MG tablet TAKE 1 TABLET BY MOUTH 3 TIMES  DAILY   hydroquinone 4 % cream Apply 1 Application topically 2 (two) times daily. Off/on one month each   insulin  glargine (LANTUS  SOLOSTAR) 100 UNIT/ML Solostar Pen Inject 8 Units into the skin daily.   Insulin  Pen Needle (B-D UF III MINI PEN NEEDLES) 31G X 5 MM MISC USE TWICE DAILY AS DIRECTED   Insulin  Pen Needle (NOVOFINE PLUS PEN NEEDLE) 32G X 4 MM MISC  injection twice per day.   lidocaine  (LIDODERM ) 5 % PLACE 1 PATCH ONTO THE SKIN DAILY. REMOVE AND DISCARD PATCH WITHIN 12 HOURS OR AS DIRECTED   linagliptin  (TRADJENTA ) 5 MG TABS tablet Take 1 tablet (5 mg total) by mouth daily.   LORazepam  (ATIVAN ) 1 MG tablet Take 1 tablet (1 mg total) by mouth every 8 (eight) hours as needed for anxiety (nausea and vomiting).   lubiprostone  (AMITIZA ) 24 MCG capsule TAKE 1 CAPSULE(24 MCG) BY MOUTH TWICE DAILY WITH A MEAL   ondansetron  (ZOFRAN ) 4 MG tablet Take prior  to each prep dose   promethazine  (PHENERGAN ) 25 MG suppository Place 1 suppository (25 mg total) rectally every 6 (six) hours as needed for nausea or vomiting.   promethazine  (PHENERGAN ) 25 MG tablet Take 1 tablet (25 mg total) by mouth every 6 (six) hours as needed for nausea or vomiting.   QUEtiapine  (SEROQUEL )  50 MG tablet TAKE 1 TABLET BY MOUTH AT  BEDTIME   REPATHA  SURECLICK 140 MG/ML SOAJ ADMINISTER 1 ML UNDER THE SKIN EVERY 14 DAYS   tacrolimus  (PROTOPIC ) 0.1 % ointment APPLY TOPICALLY TO AFFECTED  AREA(S) TWICE DAILY     Allergies  Allergen Reactions   Bee Venom Anaphylaxis    Required hospital visit to ER   Tegaderm Ag Mesh [Silver] Dermatitis    And Sorbaview tape- causes blisters and itching   Acetaminophen  Nausea And Vomiting   Novolog  [Insulin  Aspart (Human Analog)] Other (See Comments)    Muscles in feet cramp   Semaglutide  Nausea And Vomiting   Statins Other (See Comments)    GI  intolerance (N/V)   Amlodipine  Other (See Comments)    Leg edema   Clonidine  Derivatives Other (See Comments)    Sedation at 0.1 mg tablet dose   Flexeril  [Cyclobenzaprine ] Nausea And Vomiting   Lyrica  [Pregabalin ] Other (See Comments)    Leg cramping   Nsaids Nausea And Vomiting   Ozempic  (0.25 Or 0.5 Mg-Dose) [Semaglutide (0.25 Or 0.5mg -Dos)] Nausea And Vomiting   Tape Other (See Comments)    Blisters   Ceftriaxone  Other (See Comments)    Caused an ulcer in her mouth   Erythromycin Nausea And Vomiting    Social History   Socioeconomic History   Marital status: Single    Spouse name: Not on file   Number of children: 5   Years of education: 10   Highest education level: Not on file  Occupational History   Occupation: disabled  Tobacco Use   Smoking status: Every Day    Current packs/day: 0.50    Average packs/day: 0.5 packs/day for 41.0 years (20.5 ttl pk-yrs)    Types: Cigarettes    Start date: 12/27/1982    Passive exposure: Current   Smokeless tobacco: Never   Tobacco comments:    working on quitting - Down to 2 cigs a day  Vaping Use   Vaping status: Never Used  Substance and Sexual Activity   Alcohol use: Not Currently    Comment: Rare   Drug use: Yes    Types: Marijuana    Comment: daily   Sexual activity: Yes    Partners: Male    Birth control/protection: None  Other Topics Concern   Not on file  Social History Narrative   Lives alone in [place], [place]. Daughter and 2 grandchildren nearby   Has had 4 children. Living Children are [name]   Has 10 grandchildren   Social Drivers of Health   Financial Resource Strain: Low Risk  (06/14/2023)   Overall Financial Resource Strain (CARDIA)    Difficulty of Paying Living Expenses: Not very hard  Food Insecurity: No Food Insecurity (06/14/2023)   Hunger Vital Sign    Worried About Running Out of Food in the Last Year: Never true    Ran Out of Food in the Last Year: Never true  Transportation  Needs: No Transportation Needs (06/14/2023)   PRAPARE - Administrator, Civil Service (Medical): No    Lack of Transportation (Non-Medical): No  Physical Activity: Inactive (06/14/2023)   Exercise Vital Sign    Days of Exercise per Week: 0 days    Minutes of Exercise per Session: 0 min  Stress: No Stress Concern Present (06/14/2023)   [employer] of Occupational Health - Occupational Stress Questionnaire    Feeling of Stress : Only a little  Social Connections: Socially Isolated (06/14/2023)  Social Advertising Account Executive [NHANES]    Frequency of Communication with Friends and Family: Twice a week    Frequency of Social Gatherings with Friends and Family: Never    Attends Religious Services: Never    Database Administrator or Organizations: No    Attends Banker Meetings: Never    Marital Status: Divorced  Catering Manager Violence: Not At Risk (06/14/2023)   Humiliation, Afraid, Rape, and Kick questionnaire    Fear of Current or Ex-Partner: No    Emotionally Abused: No    Physically Abused: No    Sexually Abused: No     Review of Systems: General: negative for chills, fever, night sweats or weight changes.  Cardiovascular: negative for chest pain, dyspnea on exertion, edema, orthopnea, palpitations, paroxysmal nocturnal dyspnea or shortness of breath Dermatological: negative for rash Respiratory: negative for cough or wheezing Urologic: negative for hematuria Abdominal: negative for nausea, vomiting, diarrhea, bright red blood per rectum, melena, or hematemesis Neurologic: negative for visual changes, syncope, or dizziness All other systems reviewed and are otherwise negative except as noted above.    Blood pressure (!) 144/60, pulse 89, height 5' 1 (1.549 m), weight 157 lb 3.2 oz (71.3 kg), SpO2 100%.  General appearance: alert and no distress Neck: no adenopathy, no carotid bruit, no JVD, supple, symmetrical, trachea midline, and thyroid   not enlarged, symmetric, no tenderness/mass/nodules Lungs: clear to auscultation bilaterally Heart: regular rate and rhythm, S1, S2 normal, no murmur, click, rub or gallop Extremities: extremities normal, atraumatic, no cyanosis or edema Pulses: 2+ and symmetric Skin: Skin color, texture, turgor normal. No rashes or lesions Neurologic: Grossly normal  EKG not performed today      ASSESSMENT AND PLAN:   Tobacco abuse Ongoing tobacco use of 4 cigarettes a day attempting to stop.  She was put on Chantix  but had side effects.  Claudication in peripheral vascular disease (HCC) History of peripheral arterial disease with Dopplers performed 11/05/2022 revealed a right ABI of 0.86 and left of 0.65.  She did have moderate disease on the right and high-grade proximal left SFA stenosis.  She has a serum creatinine in the 3 range and we talked about the possibility of radiocontrast nephropathy.  This point her claudication is not lifestyle limiting.  Should she go on dialysis, she would be a candidate for intervention.  Hyperlipidemia History of hyperlipidemia on Repatha  with lipid profile performed 03/04/2023 revealing total cholesterol 117, LDL 36 and HDL of 69.  Essential hypertension History of essential hypertension here today 144/60.  She is on carvedilol , hydralazine , and diltiazem .     Dorn DOROTHA Lesches MD [facility] 01/09/2024 12:34 PM

## 2024-01-09 NOTE — Assessment & Plan Note (Signed)
 History of essential hypertension here today 144/60.  She is on carvedilol, hydralazine, and diltiazem.

## 2024-01-09 NOTE — Telephone Encounter (Signed)
 Patient calls nurse line regarding refill on oxycodone.   She is requesting that refill be sent to [facility] on [address].   This is not on current medication list.   Please advise.   Veronda Prude, RN

## 2024-02-01 ENCOUNTER — Ambulatory Visit: Payer: 59 | Admitting: Internal Medicine

## 2024-02-06 ENCOUNTER — Other Ambulatory Visit: Payer: Self-pay | Admitting: Family Medicine

## 2024-02-06 DIAGNOSIS — E1143 Type 2 diabetes mellitus with diabetic autonomic (poly)neuropathy: Secondary | ICD-10-CM

## 2024-02-06 DIAGNOSIS — R1115 Cyclical vomiting syndrome unrelated to migraine: Secondary | ICD-10-CM

## 2024-02-07 ENCOUNTER — Other Ambulatory Visit: Payer: Self-pay

## 2024-02-07 DIAGNOSIS — R1115 Cyclical vomiting syndrome unrelated to migraine: Secondary | ICD-10-CM

## 2024-02-07 DIAGNOSIS — E1143 Type 2 diabetes mellitus with diabetic autonomic (poly)neuropathy: Secondary | ICD-10-CM

## 2024-02-08 ENCOUNTER — Other Ambulatory Visit: Payer: Self-pay | Admitting: Family Medicine

## 2024-02-08 DIAGNOSIS — R1115 Cyclical vomiting syndrome unrelated to migraine: Secondary | ICD-10-CM

## 2024-02-08 DIAGNOSIS — E1143 Type 2 diabetes mellitus with diabetic autonomic (poly)neuropathy: Secondary | ICD-10-CM

## 2024-02-08 MED ORDER — OXYCODONE HCL 5 MG PO TABS: 5.0000 mg | ORAL_TABLET | Freq: Four times a day (QID) | ORAL | 0 refills | Status: DC | PRN

## 2024-02-08 MED ORDER — LORAZEPAM 1 MG PO TABS: 1.0000 mg | ORAL_TABLET | Freq: Three times a day (TID) | ORAL | 0 refills | Status: DC | PRN

## 2024-02-23 ENCOUNTER — Ambulatory Visit: Payer: 59 | Admitting: Family Medicine

## 2024-02-23 ENCOUNTER — Encounter: Payer: Self-pay | Admitting: Family Medicine

## 2024-02-23 VITALS — BP 190/60 | HR 81 | Ht 61.0 in | Wt 161.0 lb

## 2024-02-23 DIAGNOSIS — G720 Drug-induced myopathy: Secondary | ICD-10-CM | POA: Diagnosis not present

## 2024-02-23 DIAGNOSIS — I152 Hypertension secondary to endocrine disorders: Secondary | ICD-10-CM

## 2024-02-23 DIAGNOSIS — R252 Cramp and spasm: Secondary | ICD-10-CM

## 2024-02-23 DIAGNOSIS — J432 Centrilobular emphysema: Secondary | ICD-10-CM

## 2024-02-23 DIAGNOSIS — E1143 Type 2 diabetes mellitus with diabetic autonomic (poly)neuropathy: Secondary | ICD-10-CM

## 2024-02-23 DIAGNOSIS — R1115 Cyclical vomiting syndrome unrelated to migraine: Secondary | ICD-10-CM

## 2024-02-23 DIAGNOSIS — N184 Chronic kidney disease, stage 4 (severe): Secondary | ICD-10-CM

## 2024-02-23 DIAGNOSIS — E113213 Type 2 diabetes mellitus with mild nonproliferative diabetic retinopathy with macular edema, bilateral: Secondary | ICD-10-CM

## 2024-02-23 DIAGNOSIS — E11311 Type 2 diabetes mellitus with unspecified diabetic retinopathy with macular edema: Secondary | ICD-10-CM

## 2024-02-23 DIAGNOSIS — E1122 Type 2 diabetes mellitus with diabetic chronic kidney disease: Secondary | ICD-10-CM | POA: Diagnosis not present

## 2024-02-23 DIAGNOSIS — Z794 Long term (current) use of insulin: Secondary | ICD-10-CM

## 2024-02-23 DIAGNOSIS — H4032X1 Glaucoma secondary to eye trauma, left eye, mild stage: Secondary | ICD-10-CM

## 2024-02-23 DIAGNOSIS — E1159 Type 2 diabetes mellitus with other circulatory complications: Secondary | ICD-10-CM

## 2024-02-23 DIAGNOSIS — Z1231 Encounter for screening mammogram for malignant neoplasm of breast: Secondary | ICD-10-CM

## 2024-02-23 DIAGNOSIS — Z720 Tobacco use: Secondary | ICD-10-CM

## 2024-02-23 DIAGNOSIS — M545 Low back pain, unspecified: Secondary | ICD-10-CM

## 2024-02-23 DIAGNOSIS — F4310 Post-traumatic stress disorder, unspecified: Secondary | ICD-10-CM

## 2024-02-23 LAB — POCT GLYCOSYLATED HEMOGLOBIN (HGB A1C): HbA1c, POC (controlled diabetic range): 6.6 % (ref 0.0–7.0)

## 2024-02-23 MED ORDER — MENAQUINONE-7 45 MCG PO CAPS: 2.0000 | ORAL_CAPSULE | Freq: Every day | ORAL | 99 refills | Status: DC

## 2024-02-23 MED ORDER — LINAGLIPTIN 5 MG PO TABS: 5.0000 mg | ORAL_TABLET | Freq: Every day | ORAL | 3 refills | Status: DC

## 2024-02-23 NOTE — Patient Instructions (Addendum)
 Please call the telephone number on the back of your Medicare insurance card to see which counselors and psychologists are covered by your Medicare.   Schedule an appointment, best within the next 3 to 4 weeks, to start working on your PTSD.    Dr Tylek Boney does want to change your blood pressure medication because your lower blood pressure number is getting on  the low side which can cause problems if it gets much lower.    Referral to PM&R made  Trial of Vit K2 45 microgram caps, 2 caps daily for muscle cramps.

## 2024-02-23 NOTE — Progress Notes (Unsigned)
 [name] [name] is {Pc accompanied by:5710} Sources of clinical information for visit is/are {Information source:60032}. Nursing assessment for this office visit was reviewed with the patient for accuracy and revision.     Previous Report(s) Reviewed: {Outside review:15817}     01/03/2024   11:11 AM  Depression screen PHQ 2/9  Down, Depressed, Hopeless 0  PHQ - 2 Score 0  Altered sleeping 1  Tired, decreased energy 1  Change in appetite 3  Suicidal thoughts 0  PHQ-9 Score 5   Flowsheet Row Office Visit from 01/03/2024 in [facility] - [facility] Dept Of [place]. [facility] Office Visit from 08/18/2023 in [facility] - A Dept Of Eligha Bridegroom. [facility] Office Visit from 06/23/2023 in [facility] - [facility] Dept Of Eligha Bridegroom. [facility]  Thoughts that you would be better off dead, or of hurting yourself in some way Not at all Not at all Not at all  PHQ-9 Total Score 5 5 4           06/14/2023    3:15 PM 09/16/2021    3:01 PM 06/05/2020   10:31 AM 11/16/2018    2:50 PM 07/28/2018    8:51 AM  Fall Risk   Falls in the past year? 0 0 1 0 No  Number falls in past yr: 0 0 0 0   Injury with Fall? 0 0 1 0   Follow up Falls evaluation completed;Education provided;Falls prevention discussed           01/03/2024   11:11 AM 08/18/2023    8:49 AM 06/23/2023    9:23 AM  PHQ9 SCORE ONLY  PHQ-9 Total Score 5 5 4     There are no preventive care reminders to display for this patient.  Health Maintenance Due  Topic Date Due   Zoster Vaccines- Shingrix (1 of 2) Never done   Pneumococcal Vaccine 29-76 Years old (2 of 2 - PCV) 01/20/2018   Colonoscopy  11/19/2022   DTaP/Tdap/Td (4 - Td or Tdap) 09/29/2023   OPHTHALMOLOGY EXAM  12/11/2023   COVID-19 Vaccine (7 - 2024-25 season) 12/15/2023   MAMMOGRAM  01/07/2024      History/P.E. limitations: {exam; limitations ed:60112}  There are no preventive care reminders to display for this  patient.  Diabetes Health Maintenance Due  Topic Date Due   OPHTHALMOLOGY EXAM  12/11/2023   HEMOGLOBIN A1C  03/21/2024    Health Maintenance Due  Topic Date Due   Zoster Vaccines- Shingrix (1 of 2) Never done   Pneumococcal Vaccine 27-94 Years old (2 of 2 - PCV) 01/20/2018   Colonoscopy  11/19/2022   DTaP/Tdap/Td (4 - Td or Tdap) 09/29/2023   OPHTHALMOLOGY EXAM  12/11/2023   COVID-19 Vaccine (7 - 2024-25 season) 12/15/2023   MAMMOGRAM  01/07/2024     No chief complaint on file.    --------------------------------------------------------------------------------------------------------------------------------------------- Visit Problem List with A/P  No problem-specific Assessment & Plan notes found for this encounter.

## 2024-02-24 ENCOUNTER — Encounter: Payer: Self-pay | Admitting: Family Medicine

## 2024-02-24 ENCOUNTER — Other Ambulatory Visit: Payer: Self-pay | Admitting: Family Medicine

## 2024-02-24 LAB — RENAL FUNCTION PANEL
Albumin: 4.8 g/dL (ref 3.8–4.9)
BUN/Creatinine Ratio: 13 (ref 9–23)
BUN: 49 mg/dL — ABNORMAL HIGH (ref 6–24)
CO2: 17 mmol/L — ABNORMAL LOW (ref 20–29)
Calcium: 9.4 mg/dL (ref 8.7–10.2)
Chloride: 104 mmol/L (ref 96–106)
Creatinine, Ser: 3.7 mg/dL — ABNORMAL HIGH (ref 0.57–1.00)
Glucose: 42 mg/dL — ABNORMAL LOW (ref 70–99)
Phosphorus: 3.8 mg/dL (ref 3.0–4.3)
Potassium: 5.1 mmol/L (ref 3.5–5.2)
Sodium: 140 mmol/L (ref 134–144)
eGFR: 14 mL/min/{1.73_m2} — ABNORMAL LOW (ref >59)

## 2024-02-24 NOTE — Assessment & Plan Note (Signed)
 Established problem Uncontrolled pain in low back without radiation She feels the pain makes her life much more difficult No red flag signs or symptoms.  Some relief with baclofen prescribed in January for flare of back pain.  Patient reports intolerance of APAP because of nausea & vomiting  Patient is Stage 4-5 Chronic Kidney Disease==> no NSAIDs  Ms Robeck is interested in consulting with PM&R to see if there is and non-pharmacologic intervention that may mitigate some of her pain.  Referral to [facility] PM&R made.  Patient may request refills of Baclofen.

## 2024-02-24 NOTE — Assessment & Plan Note (Signed)
 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 She is currently receiving Repatha therapy with success from [facility].  No change in currently therapy recommended

## 2024-02-24 NOTE — Progress Notes (Unsigned)
 [name] [name] is {Pc accompanied by:5710} Sources of clinical information for visit is/are {Information source:60032}. Nursing assessment for this office visit was reviewed with the patient for accuracy and revision.     Previous Report(s) Reviewed: {Outside review:15817}     01/03/2024   11:11 AM  Depression screen PHQ 2/9  Down, Depressed, Hopeless 0  PHQ - 2 Score 0  Altered sleeping 1  Tired, decreased energy 1  Change in appetite 3  Suicidal thoughts 0  PHQ-9 Score 5   Flowsheet Row Office Visit from 01/03/2024 in [facility] - A Dept Of [place]. [facility] Office Visit from 08[facility]18/2023 in [facility] - A Dept Of Eligha Bridegroom. [facility] Office Visit from 06/23/2023 in [facility] - A Dept Of Eligha Bridegroom. [facility]  Thoughts that you would be better off dead, or of hurting yourself in some way Not at all Not at all Not at all  PHQ-9 Total Score 5 5 4           06/14/2023    3:15 PM 09/16/2021    3:01 PM 06/05/2020   10:31 AM 11/16/2018    2:50 PM 07/28/2018    8:51 AM  Fall Risk   Falls in the past year? 0 0 1 0 No  Number falls in past yr: 0 0 0 0   Injury with Fall? 0 0 1 0   Follow up Falls evaluation completed;Education provided;Falls prevention discussed           01/03/2024   11:11 AM 08/18/2023    8:49 AM 06/23/2023    9:23 AM  PHQ9 SCORE ONLY  PHQ-9 Total Score 5 5 4     There are no preventive care reminders to display for this patient.  Health Maintenance Due  Topic Date Due   Zoster Vaccines- Shingrix (1 of 2) Never done   Pneumococcal Vaccine 73-40 Years old (2 of 2 - PCV) 01/20/2018   Colonoscopy  11/19/2022   DTaP/Tdap/Td (4 - Td or Tdap) 09/29/2023   OPHTHALMOLOGY EXAM  12/11/2023   COVID-19 Vaccine (7 - 2024-25 season) 12/15/2023   MAMMOGRAM  01/07/2024      History/P.E. limitations: {exam; limitations ed:60112}  There are no preventive care reminders to display for this  patient.  Diabetes Health Maintenance Due  Topic Date Due   OPHTHALMOLOGY EXAM  12/11/2023   HEMOGLOBIN A1C  08/22/2024    Health Maintenance Due  Topic Date Due   Zoster Vaccines- Shingrix (1 of 2) Never done   Pneumococcal Vaccine 18-21 Years old (2 of 2 - PCV) 01/20/2018   Colonoscopy  11/19/2022   DTaP/Tdap/Td (4 - Td or Tdap) 09/29/2023   OPHTHALMOLOGY EXAM  12/11/2023   COVID-19 Vaccine (7 - 2024-25 season) 12/15/2023   MAMMOGRAM  01/07/2024     No chief complaint on file.    --------------------------------------------------------------------------------------------------------------------------------------------- Visit Problem List with A/P  No problem-specific Assessment & Plan notes found for this encounter.

## 2024-02-24 NOTE — Assessment & Plan Note (Signed)
 Lab Results  Component Value Date   HGBA1C 6.6 02/23/2024   Slight increase from 6.1% six months ago.  [name] [name] has stopped the linagliptin because it made her feel unwell.  Patient taking 12-15 units Lantus daily. Plan to continue Lantus at current dose

## 2024-02-24 NOTE — Assessment & Plan Note (Signed)
 Established problem Uncontrolled with nuance She is taking only Diltiazem and hydralazine She is not taking carvedilol as she rand out and did not think she needed to continue taking it.   While SBP is slightly elevated, her DBP is at 60. I am hesitant to increase her antihypertensive regiment for concern of lowering her diastolic more.   Continue diltiazem CD 180 24h 180 mg daily, hydralzaine 25 mg three times a day RTC 3 months

## 2024-02-24 NOTE — Assessment & Plan Note (Signed)
 Established problem that has decreased the number of cigarettes to about a quarter of a pack a day. She developed stomach upset when she increased to the full dose of Chantix [name] briefly meet with Dr Raymondo Band, Pharm D. To discuss further plans for smoking cessation  They decided Ms [name] [name] would return to the lower Chantix dose and see Dr Raymondo Band in follow up in a couple weeks.

## 2024-02-24 NOTE — Assessment & Plan Note (Signed)
 Established problem. Stable.  She is currently asymptomatic She has had two episodes of nausea and pain for which she took her abortive therapy. She related the exacerbation to increased dose of Chantix. She is currently not taking Chantix.    Plan to keep abortive med regiment available
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
## 2024-02-24 NOTE — Assessment & Plan Note (Signed)
    Latest Ref Rng & Units 02/23/2024   11:56 AM 08/18/2023   11:29 AM 07/13/2023   11:36 AM  BMP  Glucose 70 - 99 mg/dL 42  161  096   BUN 6 - 24 mg/dL 49  48  36   Creatinine 0.57 - 1.00 mg/dL 0.45  4.09  8.11   BUN/Creat Ratio 9 - 23 13  14     Sodium 134 - 144 mmol/L 140  140  138   Potassium 3.5 - 5.2 mmol/L 5.1  4.8  4.5   Chloride 96 - 106 mmol/L 104  106  106   CO2 20 - 29 mmol/L 17  18  19    Calcium 8.7 - 10.2 mg/dL 9.4  9.4  9.7     Estimated Glomerular Filtration Rate: 13.5 mL/min/1.7m2 (A) (by CKD-EPI based on SCr of 3.7 mg/dL (H)).  Serum creatinine within patient's general trend of values over last year.   Basic Metabolic Panel sent to Dr Marisue Humble at [facility] for his records.  Ms Lariviere reports she is to see Dr Marisue Humble in next few months.

## 2024-02-24 NOTE — Assessment & Plan Note (Signed)
 Established problem. Stable. Patient deneis cough, shob  She continues to smoke Continue to monitor for development of symptoms

## 2024-02-24 NOTE — Assessment & Plan Note (Signed)
 [name] [name] is scheduled to follow up with D. Sherryll Burger (Ophthalmology - [facility]) in April.

## 2024-03-05 ENCOUNTER — Encounter: Payer: Self-pay | Admitting: Physical Medicine & Rehabilitation

## 2024-03-06 ENCOUNTER — Ambulatory Visit (INDEPENDENT_AMBULATORY_CARE_PROVIDER_SITE_OTHER): Admitting: Student

## 2024-03-06 VITALS — BP 156/76 | HR 87 | Temp 98.5°F | Ht 61.0 in | Wt 157.0 lb

## 2024-03-06 DIAGNOSIS — N952 Postmenopausal atrophic vaginitis: Secondary | ICD-10-CM | POA: Diagnosis not present

## 2024-03-06 DIAGNOSIS — R1115 Cyclical vomiting syndrome unrelated to migraine: Secondary | ICD-10-CM

## 2024-03-06 DIAGNOSIS — M545 Low back pain, unspecified: Secondary | ICD-10-CM

## 2024-03-06 DIAGNOSIS — N898 Other specified noninflammatory disorders of vagina: Secondary | ICD-10-CM

## 2024-03-06 LAB — POCT URINALYSIS DIP (MANUAL ENTRY)
Bilirubin, UA: NEGATIVE
Glucose, UA: NEGATIVE mg/dL
Ketones, POC UA: NEGATIVE mg/dL
Leukocytes, UA: NEGATIVE
Nitrite, UA: NEGATIVE
Protein Ur, POC: >=300 mg/dL — AB
Spec Grav, UA: 1.02 (ref 1.010–1.025)
Urobilinogen, UA: 0.2 U/dL
pH, UA: 5.5 (ref 5.0–8.0)

## 2024-03-06 LAB — POCT UA - MICROSCOPIC ONLY

## 2024-03-06 MED ORDER — ESTROGENS CONJUGATED 0.625 MG/GM VA CREA: 1.0000 | TOPICAL_CREAM | Freq: Every day | VAGINAL | 12 refills | Status: AC

## 2024-03-06 NOTE — Progress Notes (Unsigned)
    SUBJECTIVE:   CHIEF COMPLAINT / HPI:   [name] [name] is a 58 y.o. female  presenting for follow-up of vaginal irritation and chronic cyclic vomiting syndrome.  Vaginal irritation: Patient reports she had a hysterectomy that took one of her ovaries.  She reports she think she has been menopausal since then.  She reports her vagina has been very irritated.  She denies abnormal discharge.  She has not been sexually active since November.  Cyclical vomiting syndrome: Chronic and somewhat stable on Zofran and Phenergan.  Patient requesting refills of medications  PERTINENT  PMH / PSH: Reviewed and updated   OBJECTIVE:   BP (!) 156/76   Pulse 87   Temp 98.5 F (36.9 C) (Oral)   Ht 5\' 1"  (1.549 m)   Wt 157 lb (71.2 kg)   SpO2 97%   BMI 29.66 kg/m   Well-appearing, no acute distress Cardio: Regular rate, regular rhythm, no murmurs on exam. Pulm: Clear, no wheezing, no crackles. No increased work of breathing Abdominal: bowel sounds present, soft, non-tender, non-distended Extremities: no peripheral edema   Pelvic Exam: MA chaperone present  Severely atrophic vaginal mucosa No abnormal discharge  Severe dryness, pain with Q-tip     01/03/2024   11:11 AM 08/18/2023    8:49 AM 06/23/2023    9:23 AM  PHQ9 SCORE ONLY  PHQ-9 Total Score 5 5 4       ASSESSMENT/PLAN:   Cyclic vomiting syndrome Refilled Zofran and Phenergan.  Patient has a appointment with GI next week.  Atrophic vaginitis Prescribed Premarin cream.  Instructed patient to use this for several weeks to see if this provides any relief.  Reassuringly on exam did not see any abnormal lesions requiring biopsy.  UA obtained and without signs of infection.  Wet prep collected unsure if this will be able to run due to lack of discharge.  GC collected.  HIV and RPR ordered but patient left before labs could be obtained.     Glendale Chard, DO [facility]

## 2024-03-06 NOTE — Patient Instructions (Addendum)
 It was great to see you today!   I will call you with the results today.   I have sent in a vaginal cream to help with the dryness.   Future Appointments  Date Time Provider Department Center  03/13/2024 11:30 AM May, Deanna J, NP [facility]  03/30/2024 12:30 PM Kirsteins, Victorino Sparrow, MD [facility]  07/09/2024 10:40 AM [facility] ANNUAL WELLNESS VISIT [facility]    Please arrive 15 minutes before your appointment to ensure smooth check in process.    Please call the clinic at [phone] if your symptoms worsen or you have any concerns.  Thank you for allowing me to participate in your care, Dr. Glendale Chard [facility]

## 2024-03-07 ENCOUNTER — Other Ambulatory Visit (HOSPITAL_COMMUNITY)
Admission: RE | Admit: 2024-03-07 | Discharge: 2024-03-07 | Disposition: A | Discharge status: Home / Self Care | Source: Ambulatory Visit | Attending: Family Medicine | Admitting: Family Medicine

## 2024-03-07 DIAGNOSIS — N898 Other specified noninflammatory disorders of vagina: Secondary | ICD-10-CM | POA: Diagnosis present

## 2024-03-07 NOTE — Assessment & Plan Note (Addendum)
 Prescribed Premarin cream.  Instructed patient to use this for several weeks to see if this provides any relief.  Reassuringly on exam did not see any abnormal lesions requiring biopsy.  UA obtained and without signs of infection.  Wet prep collected unsure if this will be able to run due to lack of discharge.  GC collected.  HIV and RPR ordered but patient left before labs could be obtained.

## 2024-03-07 NOTE — Assessment & Plan Note (Addendum)
 Refilled Zofran and Phenergan.  Patient has a appointment with GI next week.

## 2024-03-08 ENCOUNTER — Other Ambulatory Visit: Payer: Self-pay

## 2024-03-08 DIAGNOSIS — E1143 Type 2 diabetes mellitus with diabetic autonomic (poly)neuropathy: Secondary | ICD-10-CM

## 2024-03-08 DIAGNOSIS — R1115 Cyclical vomiting syndrome unrelated to migraine: Secondary | ICD-10-CM

## 2024-03-09 ENCOUNTER — Encounter: Payer: Self-pay | Admitting: Student

## 2024-03-09 LAB — CERVICOVAGINAL ANCILLARY ONLY
Bacterial Vaginitis (gardnerella): POSITIVE — AB
Candida Glabrata: NEGATIVE
Candida Vaginitis: NEGATIVE
Comment: NEGATIVE
Comment: NEGATIVE
Comment: NEGATIVE
Comment: NEGATIVE
Trichomonas: NEGATIVE

## 2024-03-09 MED ORDER — OXYCODONE HCL 5 MG PO TABS: 5.0000 mg | ORAL_TABLET | Freq: Four times a day (QID) | ORAL | 0 refills | Status: DC | PRN

## 2024-03-09 MED ORDER — LORAZEPAM 1 MG PO TABS: 1.0000 mg | ORAL_TABLET | Freq: Three times a day (TID) | ORAL | 0 refills | Status: DC | PRN

## 2024-03-09 NOTE — Progress Notes (Signed)
 Result letter routed to staff to mail to patient.  Glendale Chard, DO [facility], PGY-2 03/09/24 4:46 PM

## 2024-03-09 NOTE — Telephone Encounter (Signed)
 Patient returns call to nurse line regarding med refills.   She is going to be in [place] today and would like to be able to pick up medication.   Please advise.   Veronda Prude, RN

## 2024-03-13 ENCOUNTER — Ambulatory Visit: Payer: 59 | Admitting: Gastroenterology

## 2024-03-13 ENCOUNTER — Encounter: Payer: Self-pay | Admitting: Gastroenterology

## 2024-03-13 ENCOUNTER — Telehealth: Payer: Self-pay | Admitting: Gastroenterology

## 2024-03-13 VITALS — BP 148/72 | HR 88 | Ht 61.5 in | Wt 157.5 lb

## 2024-03-13 DIAGNOSIS — Z860101 Personal history of adenomatous and serrated colon polyps: Secondary | ICD-10-CM | POA: Diagnosis not present

## 2024-03-13 DIAGNOSIS — N184 Chronic kidney disease, stage 4 (severe): Secondary | ICD-10-CM | POA: Diagnosis not present

## 2024-03-13 DIAGNOSIS — K5904 Chronic idiopathic constipation: Secondary | ICD-10-CM | POA: Diagnosis not present

## 2024-03-13 DIAGNOSIS — Z860100 Personal history of colon polyps, unspecified: Secondary | ICD-10-CM

## 2024-03-13 DIAGNOSIS — R112 Nausea with vomiting, unspecified: Secondary | ICD-10-CM
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
## 2024-03-13 NOTE — Progress Notes (Signed)
 Colonoscopy (only) at hospital.  MiraLAX morning prior followed by split prep Sutab's.  Okay to use Phenergan and lorazepam than before each prep session.

## 2024-03-13 NOTE — Telephone Encounter (Signed)
 error

## 2024-03-13 NOTE — Patient Instructions (Addendum)
 Samples of Linzess po daily provided for constipation. Take 2 tablet 30-45 minutes before first meal of the day with water. You may have diarrhea the first several days until everything clears out. If the medication works well for you call the office and we can send prescription.  We have given you samples of the following medication to take: Linzess _______________________________________________________  If your blood pressure at your visit was 140/90 or greater, please contact your primary care physician to follow up on this.  _______________________________________________________  If you are age 51 or older, your body mass index should be between 23-30. Your Body mass index is 29.28 kg/m. If this is out of the aforementioned range listed, please consider follow up with your Primary Care Provider.  If you are age 63 or younger, your body mass index should be between 19-25. Your Body mass index is 29.28 kg/m. If this is out of the aformentioned range listed, please consider follow up with your Primary Care Provider.   ________________________________________________________  The [facility] providers would like to encourage you to use [facility] to communicate with providers for non-urgent requests or questions.  Due to long hold times on the telephone, sending your provider a message by [facility] may be a faster and more efficient way to get a response.  Please allow 48 business hours for a response.  Please remember that this is for non-urgent requests.  _______________________________________________________  Thank you for trusting me with your gastrointestinal care!   Margarite Gouge May, NP

## 2024-03-15 ENCOUNTER — Other Ambulatory Visit: Payer: Self-pay

## 2024-03-15 DIAGNOSIS — Z860100 Personal history of colon polyps, unspecified: Secondary | ICD-10-CM | POA: Insufficient documentation

## 2024-03-15 HISTORY — DX: Personal history of colon polyps, unspecified: Z86.0100

## 2024-03-15 NOTE — Progress Notes (Deleted)
 Reviewed and agree with Dr Macky Lower plan.

## 2024-03-16 ENCOUNTER — Ambulatory Visit: Payer: Self-pay | Admitting: Student

## 2024-03-20 ENCOUNTER — Ambulatory Visit

## 2024-03-28 ENCOUNTER — Other Ambulatory Visit: Payer: Self-pay | Admitting: Family Medicine

## 2024-03-28 DIAGNOSIS — R1115 Cyclical vomiting syndrome unrelated to migraine: Secondary | ICD-10-CM

## 2024-03-28 DIAGNOSIS — E1143 Type 2 diabetes mellitus with diabetic autonomic (poly)neuropathy: Secondary | ICD-10-CM
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
## 2024-03-30 ENCOUNTER — Encounter: Payer: Self-pay | Admitting: Physical Medicine & Rehabilitation

## 2024-03-30 ENCOUNTER — Telehealth: Payer: Self-pay

## 2024-03-30 ENCOUNTER — Other Ambulatory Visit (HOSPITAL_COMMUNITY): Payer: Self-pay

## 2024-03-30 ENCOUNTER — Encounter: Payer: Self-pay | Admitting: Student

## 2024-03-30 ENCOUNTER — Encounter: Attending: Physical Medicine & Rehabilitation | Admitting: Physical Medicine & Rehabilitation

## 2024-03-30 ENCOUNTER — Ambulatory Visit: Admitting: Student

## 2024-03-30 VITALS — BP 182/68 | HR 65 | Ht 61.5 in

## 2024-03-30 VITALS — BP 168/62 | HR 72 | Ht 61.5 in | Wt 162.2 lb

## 2024-03-30 DIAGNOSIS — I152 Hypertension secondary to endocrine disorders: Secondary | ICD-10-CM | POA: Diagnosis not present

## 2024-03-30 DIAGNOSIS — N898 Other specified noninflammatory disorders of vagina: Secondary | ICD-10-CM | POA: Diagnosis not present

## 2024-03-30 DIAGNOSIS — E1159 Type 2 diabetes mellitus with other circulatory complications: Secondary | ICD-10-CM | POA: Diagnosis not present

## 2024-03-30 DIAGNOSIS — G8929 Other chronic pain: Secondary | ICD-10-CM

## 2024-03-30 DIAGNOSIS — M545 Low back pain, unspecified: Secondary | ICD-10-CM | POA: Diagnosis present

## 2024-03-30 MED ORDER — METRONIDAZOLE 0.75 % VA GEL
1.0000 | Freq: Every day | VAGINAL | 0 refills | Status: AC
  Filled 2024-03-30: qty 70, 7d supply, fill #0

## 2024-03-30 NOTE — Assessment & Plan Note (Addendum)
 Low diastolic.  Will continue blood pressure medication as prescribed.  No changes in medication today.

## 2024-03-30 NOTE — Patient Instructions (Signed)
 Back Exercises These exercises help to make your trunk and back strong. They also help to keep the lower back flexible. Doing these exercises can help to prevent or lessen pain in your lower back. If you have back pain, try to do these exercises 2-3 times each day or as told by your doctor. As you get better, do the exercises once each day. Repeat the exercises more often as told by your doctor. To stop back pain from coming back, do the exercises once each day, or as told by your doctor. Do exercises exactly as told by your doctor. Stop right away if you feel sudden pain or your pain gets worse. Exercises Single knee to chest Do these steps 3-5 times in a row for each leg: Lie on your back on a firm bed or the floor with your legs stretched out. Bring one knee to your chest. Grab your knee or thigh with both hands and hold it in place. Pull on your knee until you feel a gentle stretch in your lower back or butt. Keep doing the stretch for 10-30 seconds. Slowly let go of your leg and straighten it. Pelvic tilt Do these steps 5-10 times in a row: Lie on your back on a firm bed or the floor with your legs stretched out. Bend your knees so they point up to the ceiling. Your feet should be flat on the floor. Tighten your lower belly (abdomen) muscles to press your lower back against the floor. This will make your tailbone point up to the ceiling instead of pointing down to your feet or the floor. Stay in this position for 5-10 seconds while you gently tighten your muscles and breathe evenly. Cat-cow Do these steps until your lower back bends more easily: Get on your hands and knees on a firm bed or the floor. Keep your hands under your shoulders, and keep your knees under your hips. You may put padding under your knees. Let your head hang down toward your chest. Tighten (contract) the muscles in your belly. Point your tailbone toward the floor so your lower back becomes rounded like the back of a  cat. Stay in this position for 5 seconds. Slowly lift your head. Let the muscles of your belly relax. Point your tailbone up toward the ceiling so your back forms a sagging arch like the back of a cow. Stay in this position for 5 seconds.  Press-ups Do these steps 5-10 times in a row: Lie on your belly (face-down) on a firm bed or the floor. Place your hands near your head, about shoulder-width apart. While you keep your back relaxed and keep your hips on the floor, slowly straighten your arms to raise the top half of your body and lift your shoulders. Do not use your back muscles. You may change where you place your hands to make yourself more comfortable. Stay in this position for 5 seconds. Keep your back relaxed. Slowly return to lying flat on the floor.  Bridges Do these steps 10 times in a row: Lie on your back on a firm bed or the floor. Bend your knees so they point up to the ceiling. Your feet should be flat on the floor. Your arms should be flat at your sides, next to your body. Tighten your butt muscles and lift your butt off the floor until your waist is almost as high as your knees. If you do not feel the muscles working in your butt and the back of  your thighs, slide your feet 1-2 inches (2.5-5 cm) farther away from your butt. Stay in this position for 3-5 seconds. Slowly lower your butt to the floor, and let your butt muscles relax. If this exercise is too easy, try doing it with your arms crossed over your chest. Belly crunches Do these steps 5-10 times in a row: Lie on your back on a firm bed or the floor with your legs stretched out. Bend your knees so they point up to the ceiling. Your feet should be flat on the floor. Cross your arms over your chest. Tip your chin a little bit toward your chest, but do not bend your neck. Tighten your belly muscles and slowly raise your chest just enough to lift your shoulder blades a tiny bit off the floor. Avoid raising your body  higher than that because it can put too much stress on your lower back. Slowly lower your chest and your head to the floor. Back lifts Do these steps 5-10 times in a row: Lie on your belly (face-down) with your arms at your sides, and rest your forehead on the floor. Tighten the muscles in your legs and your butt. Slowly lift your chest off the floor while you keep your hips on the floor. Keep the back of your head in line with the curve in your back. Look at the floor while you do this. Stay in this position for 3-5 seconds. Slowly lower your chest and your face to the floor. Contact a doctor if: Your back pain gets a lot worse when you do an exercise. Your back pain does not get better within 2 hours after you exercise. If you have any of these problems, stop doing the exercises. Do not do them again unless your doctor says it is okay. Get help right away if: You have sudden, very bad back pain. If this happens, stop doing the exercises. Do not do them again unless your doctor says it is okay. This information is not intended to replace advice given to you by your health care provider. Make sure you discuss any questions you have with your health care provider. Document Revised: 02/25/2021 Document Reviewed: 02/25/2021 Elsevier Patient Education  2024 [employer] Non medication pain relief  Try ice alternating with heating pad for 20 minutes every 2 hours as needed  Try TENs unit that can be puchased in a pharmacy for about 40.00, brands include Aleve or Icy Hot  Try various muscle cremes  Aspercreme or others that contain trolamine- this is anti inflammatory  Capsaicin creme which reduces Pain substance P  Lidocaine containing creme which has a numbing medicine  Menthol and camphor containing creme which has a cooling effect

## 2024-03-30 NOTE — Progress Notes (Signed)
    SUBJECTIVE:   CHIEF COMPLAINT / HPI:   [name] [name] is a 58 y.o. female presenting for blood pressure follow up and vaginal discharge.   Patient reports she just recently started back her diltiazem and hydralazine 3 times daily.  She denies having side effects.  She reports she has been having vaginal discharge on and off.  She reports the smell is very bad.  She is not currently sexually active.  He reports the estrogen cream has helped her symptoms.  She is going on a trip recently and she use more of her Ativan this month due to having worsening cyclical vomiting syndrome.  She like to have PCP refill Ativan before she is in the trip.  PERTINENT  PMH / PSH: reviewed and updated.  OBJECTIVE:   BP (!) 168/62   Pulse 72   Ht 5' 1.5" (1.562 m)   Wt 162 lb 3.2 oz (73.6 kg)   SpO2 100%   BMI 30.15 kg/m   Well-appearing, no acute distress Cardio: Regular rate, regular rhythm, no murmurs on exam. Pulm: Clear, no wheezing, no crackles. No increased work of breathing Abdominal: bowel sounds present, soft, non-tender, non-distended Extremities: no peripheral edema  Neuro: alert and oriented x3, speech normal in content, no facial asymmetry, strength intact and equal bilaterally in UE and LE, pupils equal and reactive to light.  Psych:  Cognition and judgment appear intact. Alert, communicative  and cooperative with normal attention span and concentration. No apparent delusions, illusions, hallucinations    ASSESSMENT/PLAN:   Assessment & Plan Hypertension associated with diabetes (HCC) Low diastolic.  Will continue blood pressure medication as prescribed.  No changes in medication today. Vaginal discharge Will empirically treat for BV.  Prescribed vaginal metronidazole gel to be used for 7 days.     Glendale Chard, DO [facility]

## 2024-03-30 NOTE — Patient Instructions (Addendum)
 Please send a message to your PCP Dr. McDiarmid about your Ativan. He will work on that for you.   For your vaginal discharge I will send in metronidazole suppositories.  Use this at bedtime for the next 7 days.  You are keeping your blood pressure medication the same.

## 2024-03-30 NOTE — Telephone Encounter (Signed)
 I have not talked with [name] [name] about these meds.  No refill planned unless [name] Bound requests them.

## 2024-04-03 LAB — HM DIABETES EYE EXAM

## 2024-04-06 ENCOUNTER — Other Ambulatory Visit: Payer: Self-pay

## 2024-04-06 ENCOUNTER — Other Ambulatory Visit: Payer: Self-pay | Admitting: Family Medicine

## 2024-04-06 DIAGNOSIS — R1115 Cyclical vomiting syndrome unrelated to migraine: Secondary | ICD-10-CM

## 2024-04-06 DIAGNOSIS — E1143 Type 2 diabetes mellitus with diabetic autonomic (poly)neuropathy: Secondary | ICD-10-CM

## 2024-04-06 DIAGNOSIS — E1159 Type 2 diabetes mellitus with other circulatory complications: Secondary | ICD-10-CM

## 2024-04-06 MED ORDER — LORAZEPAM 1 MG PO TABS: 1.0000 mg | ORAL_TABLET | Freq: Three times a day (TID) | ORAL | 0 refills | Status: DC | PRN

## 2024-04-06 MED ORDER — OXYCODONE HCL 5 MG PO TABS
5.0000 mg | ORAL_TABLET | Freq: Four times a day (QID) | ORAL | 0 refills | Status: DC | PRN
Start: 2024-04-08 — End: 2024-05-08

## 2024-04-10 ENCOUNTER — Ambulatory Visit

## 2024-04-12 ENCOUNTER — Telehealth: Payer: Self-pay | Admitting: Gastroenterology

## 2024-04-12 NOTE — Telephone Encounter (Signed)
 Patient called and stated that she was taking sample of the Linzess and she has noticed a difference since taken that medication. Patient is requesting a call back. Please advise.

## 2024-04-16 ENCOUNTER — Ambulatory Visit (INDEPENDENT_AMBULATORY_CARE_PROVIDER_SITE_OTHER): Admitting: Family Medicine

## 2024-04-16 ENCOUNTER — Encounter: Payer: Self-pay | Admitting: Family Medicine

## 2024-04-16 VITALS — BP 151/61 | HR 73 | Ht 61.0 in | Wt 160.6 lb

## 2024-04-16 DIAGNOSIS — I739 Peripheral vascular disease, unspecified: Secondary | ICD-10-CM | POA: Diagnosis not present

## 2024-04-16 NOTE — Patient Instructions (Signed)
 It was great to see you!  Our plans for today:  - You can look for scooters at the local medical supply stores to compare prices.   Best of luck at [employer]! I hope you enjoy your trip!  Take care and seek immediate care sooner if you develop any concerns.   Dr. Lizzie Cokley

## 2024-04-16 NOTE — Progress Notes (Signed)
   SUBJECTIVE:   CHIEF COMPLAINT / HPI:   Claudication, request for DME  - going to [place] [employer] next week for niece's birthday and wanting scooter to take with her to help with all of the walking she will be doing.  - has PVD and pain with walking. Repatha  helped with walking but triggered her cyclical vomiting syndrome.     OBJECTIVE:   BP (!) 151/61   Pulse 73   Ht 5\' 1"  (1.549 m)   Wt 160 lb 9.6 oz (72.8 kg)   SpO2 100%   BMI 30.35 kg/m   Gen: well appearing, in NAD Card: Reg rate Lungs: Comfortable WOB on RA Ext: WWP, no edema   ASSESSMENT/PLAN:   DME request Advised that without medical necessity insurance unlikely to pay for scooter. Advised to check local medical supply stores or [place] or rent scooter once at the parks.    Kandis Ormond, DO

## 2024-04-17 ENCOUNTER — Other Ambulatory Visit: Payer: Self-pay | Admitting: Gastroenterology

## 2024-04-17 DIAGNOSIS — K581 Irritable bowel syndrome with constipation: Secondary | ICD-10-CM

## 2024-04-17 MED ORDER — LINACLOTIDE 290 MCG PO CAPS
290.0000 ug | ORAL_CAPSULE | Freq: Every day | ORAL | 1 refills | Status: DC
Start: 2024-04-17 — End: 2024-10-18

## 2024-04-17 NOTE — Telephone Encounter (Signed)
 PT is calling to update that Linzess  is working well for her and she would like to have a full prescription. Please send to [facility] on [address]

## 2024-04-17 NOTE — Progress Notes (Signed)
 Linzess  290mcg po daily working well for patient- sent RX

## 2024-04-18 ENCOUNTER — Other Ambulatory Visit: Payer: Self-pay

## 2024-04-23 ENCOUNTER — Ambulatory Visit (AMBULATORY_SURGERY_CENTER)

## 2024-04-23 ENCOUNTER — Other Ambulatory Visit: Payer: Self-pay | Admitting: Family Medicine

## 2024-04-23 ENCOUNTER — Encounter: Payer: Self-pay | Admitting: Family Medicine

## 2024-04-23 VITALS — Ht 61.5 in | Wt 160.0 lb

## 2024-04-23 DIAGNOSIS — K581 Irritable bowel syndrome with constipation: Secondary | ICD-10-CM

## 2024-04-23 DIAGNOSIS — R112 Nausea with vomiting, unspecified: Secondary | ICD-10-CM

## 2024-04-23 DIAGNOSIS — H1010 Acute atopic conjunctivitis, unspecified eye: Secondary | ICD-10-CM

## 2024-04-23 DIAGNOSIS — Z860100 Personal history of colon polyps, unspecified: Secondary | ICD-10-CM

## 2024-04-23 DIAGNOSIS — K5904 Chronic idiopathic constipation: Secondary | ICD-10-CM

## 2024-04-23 DIAGNOSIS — G8929 Other chronic pain: Secondary | ICD-10-CM

## 2024-04-23 MED ORDER — LIDOCAINE 5 % EX PTCH: 1.0000 | MEDICATED_PATCH | CUTANEOUS | 0 refills | Status: DC

## 2024-04-23 MED ORDER — SUTAB 1479-225-188 MG PO TABS
12.0000 | ORAL_TABLET | ORAL | 0 refills | Status: DC
Start: 2024-04-23 — End: 2024-06-26

## 2024-04-23 MED ORDER — FLUTICASONE PROPIONATE 50 MCG/ACT NA SUSP: 2.0000 | Freq: Every day | NASAL | 6 refills | Status: DC

## 2024-04-23 NOTE — Progress Notes (Signed)
 No egg or soy allergy known to patient  No issues known to pt with past sedation with any surgeries or procedures Patient denies ever being told they had issues or difficulty with intubation  No FH of Malignant Hyperthermia Pt is not on diet pills Pt is not on  home 02  Pt is not on blood thinners  Pt with  constipation  No A fib or A flutter Have any cardiac testing pending--no  LOA: independent  Prep: 2 day sutab   Patient's chart reviewed by Rogena Class CNRA prior to previsit and patient appropriate for the [facility].  Previsit completed and red dot placed by patient's name on their procedure day (on provider's schedule).     PV completed with patient. Prep instructions sent via mychart and home address.

## 2024-04-23 NOTE — Progress Notes (Signed)
 Patient scheduled for hospital procedure.

## 2024-04-24 ENCOUNTER — Ambulatory Visit
Admission: RE | Admit: 2024-04-24 | Discharge: 2024-04-24 | Disposition: A | Discharge status: Home / Self Care | Source: Ambulatory Visit | Attending: Family Medicine | Admitting: Family Medicine

## 2024-04-24 DIAGNOSIS — Z1231 Encounter for screening mammogram for malignant neoplasm of breast: Secondary | ICD-10-CM

## 2024-04-27 ENCOUNTER — Other Ambulatory Visit: Payer: Self-pay | Admitting: Family Medicine

## 2024-04-27 DIAGNOSIS — R928 Other abnormal and inconclusive findings on diagnostic imaging of breast: Secondary | ICD-10-CM

## 2024-04-30 ENCOUNTER — Encounter: Payer: Self-pay | Admitting: Internal Medicine

## 2024-05-04 ENCOUNTER — Encounter (HOSPITAL_COMMUNITY): Payer: Self-pay

## 2024-05-08 ENCOUNTER — Encounter: Admitting: Physical Medicine & Rehabilitation

## 2024-05-08 ENCOUNTER — Telehealth: Payer: Self-pay

## 2024-05-08 ENCOUNTER — Other Ambulatory Visit: Payer: Self-pay

## 2024-05-08 DIAGNOSIS — E1143 Type 2 diabetes mellitus with diabetic autonomic (poly)neuropathy: Secondary | ICD-10-CM

## 2024-05-08 DIAGNOSIS — R1115 Cyclical vomiting syndrome unrelated to migraine: Secondary | ICD-10-CM

## 2024-05-08 NOTE — Telephone Encounter (Signed)
 Received call from patient and her daughter requesting appointment with Dr. McDiarmid as soon as possible.   Provider's schedule does not have any current availability, July schedule is not yet out.   Patient's daughter reports that patient is in Stage V kidney failure and that she will have to proceed with dialysis. However, patient was told that she has to have appointment with PCP prior to starting treatment.   They are requesting to be placed on wait/cancellation list for a sooner appointment with Dr. McDiarmid.   Admin team- is there a way to add patient to cancellation list without her already having an appointment?   Will forward to PCP as well.   Elsie Halo, RN

## 2024-05-09 ENCOUNTER — Encounter (HOSPITAL_COMMUNITY): Payer: Self-pay | Admitting: Internal Medicine

## 2024-05-09 ENCOUNTER — Encounter: Payer: Self-pay | Admitting: Family Medicine

## 2024-05-09 ENCOUNTER — Telehealth: Payer: Self-pay | Admitting: *Deleted

## 2024-05-09 MED ORDER — LORAZEPAM 1 MG PO TABS: 1.0000 mg | ORAL_TABLET | Freq: Three times a day (TID) | ORAL | 0 refills | Status: DC | PRN

## 2024-05-09 MED ORDER — OXYCODONE HCL 5 MG PO TABS: 5.0000 mg | ORAL_TABLET | Freq: Four times a day (QID) | ORAL | 0 refills | Status: DC | PRN

## 2024-05-09 NOTE — Telephone Encounter (Signed)
 [facility] [address] [place], [place]  [address] Phone:  [phone]   Fax:  [phone]   [name] [name] DOB: 1966-11-10 MRN: [id number]  Dear: Dr. Arita Belch:   The patient above is schedule for a colonoscopy in the near future under general anesthesia (Propofol ).    Please fax or route a note of Medical Clearance to [phone], Attn: Cecille Mcclusky, CMA. Patient is having her upcoming procedure at [facility] and anesthesia is requesting clearance on her kidney disease.   Please advise if this patient will require an office visit or further medical work-up before clearance can be given.   Thank you,   Pattie Borders, [facility]

## 2024-05-09 NOTE — Telephone Encounter (Signed)
 Reviewed. Ms Dicola is on the cancellation list.  This is the scheduling plan for now.

## 2024-05-09 NOTE — Telephone Encounter (Signed)
 Please schedule [name] [name] an appointment with me at the end of my 5/22 morning clinic.  May double book.

## 2024-05-09 NOTE — Telephone Encounter (Signed)
 Patient stated that she is unable to come in that day due to her having her colonoscopy that morning.

## 2024-05-09 NOTE — Telephone Encounter (Signed)
 OK for colonoscopy letter sent to Dr Alberta Almond in-basket.

## 2024-05-10 ENCOUNTER — Telehealth: Payer: Self-pay | Admitting: Gastroenterology

## 2024-05-10 NOTE — Telephone Encounter (Signed)
 Procedure:Colonoscopy Procedure date: 05/17/24 Procedure location: [facility] Arrival Time: 8:15 am Spoke with the patient Y/N: Yes Any prep concerns? No  Has the patient obtained the prep from the pharmacy ? Yes Do you have a care partner and transportation: Yes Any additional concerns? No

## 2024-05-11 ENCOUNTER — Other Ambulatory Visit

## 2024-05-11 ENCOUNTER — Encounter

## 2024-05-17 ENCOUNTER — Encounter (HOSPITAL_COMMUNITY): Payer: Self-pay | Admitting: Internal Medicine

## 2024-05-17 ENCOUNTER — Other Ambulatory Visit: Payer: Self-pay

## 2024-05-17 ENCOUNTER — Encounter (HOSPITAL_COMMUNITY)
Admission: RE | Disposition: A | Discharge status: Home / Self Care | Payer: Self-pay | Source: Home / Self Care | Attending: Internal Medicine

## 2024-05-17 ENCOUNTER — Ambulatory Visit (HOSPITAL_COMMUNITY): Payer: Self-pay | Admitting: Physician Assistant

## 2024-05-17 ENCOUNTER — Ambulatory Visit (HOSPITAL_COMMUNITY)
Admission: RE | Admit: 2024-05-17 | Discharge: 2024-05-17 | Disposition: A | Discharge status: Home / Self Care | Attending: Internal Medicine | Admitting: Internal Medicine

## 2024-05-17 DIAGNOSIS — Z1211 Encounter for screening for malignant neoplasm of colon: Secondary | ICD-10-CM

## 2024-05-17 DIAGNOSIS — F17210 Nicotine dependence, cigarettes, uncomplicated: Secondary | ICD-10-CM

## 2024-05-17 DIAGNOSIS — E1122 Type 2 diabetes mellitus with diabetic chronic kidney disease: Secondary | ICD-10-CM

## 2024-05-17 DIAGNOSIS — Z860100 Personal history of colon polyps, unspecified: Secondary | ICD-10-CM

## 2024-05-17 DIAGNOSIS — N183 Chronic kidney disease, stage 3 unspecified: Secondary | ICD-10-CM | POA: Insufficient documentation

## 2024-05-17 DIAGNOSIS — I129 Hypertensive chronic kidney disease with stage 1 through stage 4 chronic kidney disease, or unspecified chronic kidney disease: Secondary | ICD-10-CM

## 2024-05-17 DIAGNOSIS — Z860101 Personal history of adenomatous and serrated colon polyps: Secondary | ICD-10-CM | POA: Diagnosis not present

## 2024-05-17 DIAGNOSIS — K219 Gastro-esophageal reflux disease without esophagitis: Secondary | ICD-10-CM | POA: Insufficient documentation

## 2024-05-17 DIAGNOSIS — E11649 Type 2 diabetes mellitus with hypoglycemia without coma: Secondary | ICD-10-CM | POA: Diagnosis not present

## 2024-05-17 DIAGNOSIS — Z794 Long term (current) use of insulin: Secondary | ICD-10-CM | POA: Insufficient documentation

## 2024-05-17 DIAGNOSIS — E1151 Type 2 diabetes mellitus with diabetic peripheral angiopathy without gangrene: Secondary | ICD-10-CM | POA: Insufficient documentation

## 2024-05-17 DIAGNOSIS — Z8711 Personal history of peptic ulcer disease: Secondary | ICD-10-CM | POA: Diagnosis not present

## 2024-05-17 HISTORY — PX: COLONOSCOPY: SHX5424

## 2024-05-17 LAB — GLUCOSE, CAPILLARY
Glucose-Capillary: 117 mg/dL — ABNORMAL HIGH (ref 70–99)
Glucose-Capillary: 64 mg/dL — ABNORMAL LOW (ref 70–99)
Glucose-Capillary: 104 mg/dL — ABNORMAL HIGH (ref 70–99)
Glucose-Capillary: 37 mg/dL — CL (ref 70–99)
Glucose-Capillary: 39 mg/dL — CL (ref 70–99)

## 2024-05-17 SURGERY — COLONOSCOPY
Anesthesia: Monitor Anesthesia Care

## 2024-05-17 MED ORDER — LIDOCAINE 2% (20 MG/ML) 5 ML SYRINGE
INTRAMUSCULAR | Status: DC | PRN
Start: 2024-05-17 — End: 2024-05-17
  Administered 2024-05-17: 50 mg via INTRAVENOUS

## 2024-05-17 MED ORDER — DEXTROSE 50 % IV SOLN
12.5000 g | INTRAVENOUS | Status: AC
  Administered 2024-05-17: 12.5 g via INTRAVENOUS

## 2024-05-17 MED ORDER — SODIUM CHLORIDE 0.9% FLUSH
3.0000 mL | INTRAVENOUS | Status: DC | PRN
Start: 2024-05-17 — End: 2024-05-17

## 2024-05-17 MED ORDER — HYDRALAZINE HCL 20 MG/ML IJ SOLN
INTRAMUSCULAR | Status: AC
  Filled 2024-05-17: qty 1

## 2024-05-17 MED ORDER — DEXMEDETOMIDINE HCL IN NACL 200 MCG/50ML IV SOLN
INTRAVENOUS | Status: DC | PRN
  Administered 2024-05-17: 8 ug via INTRAVENOUS

## 2024-05-17 MED ORDER — PROPOFOL 1000 MG/100ML IV EMUL
INTRAVENOUS | Status: AC
  Filled 2024-05-17: qty 100

## 2024-05-17 MED ORDER — DEXTROSE 50 % IV SOLN
INTRAVENOUS | Status: AC
  Filled 2024-05-17: qty 50

## 2024-05-17 MED ORDER — DEXTROSE 50 % IV SOLN
25.0000 mL | Freq: Once | INTRAVENOUS | Status: AC
  Administered 2024-05-17: 25 mL via INTRAVENOUS

## 2024-05-17 MED ORDER — SODIUM CHLORIDE 0.9% FLUSH: 3.0000 mL | Freq: Two times a day (BID) | INTRAVENOUS | Status: DC

## 2024-05-17 MED ORDER — HYDRALAZINE HCL 20 MG/ML IJ SOLN
5.0000 mg | Freq: Once | INTRAMUSCULAR | Status: AC
  Administered 2024-05-17: 5 mg via INTRAVENOUS

## 2024-05-17 MED ORDER — SODIUM CHLORIDE 0.9 % IV SOLN: INTRAVENOUS | Status: DC | PRN

## 2024-05-17 MED ORDER — LIDOCAINE HCL 1 % IJ SOLN: INTRAMUSCULAR | Status: DC | PRN

## 2024-05-17 MED ORDER — PROPOFOL 500 MG/50ML IV EMUL
INTRAVENOUS | Status: DC | PRN
  Administered 2024-05-17: 120 ug/kg/min via INTRAVENOUS

## 2024-05-17 MED ORDER — PROPOFOL 10 MG/ML IV BOLUS
INTRAVENOUS | Status: DC | PRN
  Administered 2024-05-17: 50 mg via INTRAVENOUS

## 2024-05-17 NOTE — Progress Notes (Signed)
 Pt in Endo admit and b/p was elevated. Pt denied any dizziness, vision changes, headache or nausea at time of admission. Anesthesiologist was made aware and advised to monitor pt and notify them of any changes. Around 0910 pt's blood pressure was 223/87 and she started to complain of a slight headache. Dr. Otis Blocker notified. Hydralazine  5mg  IV x 2 10 min apart ordered by Dr. Otis Blocker. After 2nd dose of hydralazine  was given pt's blood pressure was 194/78 and HR 87. Maxie Spaniel, RN
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
## 2024-05-17 NOTE — Transfer of Care (Signed)
 Immediate Anesthesia Transfer of Care Note  Patient: [name] [name]  Procedure(s) Performed: COLONOSCOPY  Patient Location: PACU  Anesthesia Type:General  Level of Consciousness: awake, alert , and oriented  Airway & Oxygen Therapy: Patient Spontanous Breathing and Patient connected to face mask oxygen  Post-op Assessment: Report given to RN and Post -op Vital signs reviewed and stable  Post vital signs: Reviewed and stable  Last Vitals:  Vitals Value Taken Time  BP 131/47 05/17/24 1047  Temp    Pulse 72 05/17/24 1048  Resp 19 05/17/24 1048  SpO2 100 % 05/17/24 1048  Vitals shown include unfiled device data.  Last Pain:  Vitals:   05/17/24 0832  TempSrc: Temporal  PainSc: 0-No pain         Complications: No notable events documented.

## 2024-05-17 NOTE — Op Note (Signed)
 [facility] Patient Name: [name] [name] Procedure Date: 05/17/2024 MRN: [id number] Attending MD: Murel Arlington. Elvin Hammer , MD, [id number] Date of Birth: 1966/06/01 CSN: [id number] Age: 58 Admit Type: Outpatient Procedure:                Colonoscopy Indications:              High risk colon cancer surveillance: Personal                            history of multiple (3 or more) adenomas. Previous                            examinations 2019 (fair prep), 2020 Providers:                Murel Arlington. Elvin Hammer, MD, Suzann Ernst, RN, Nicki Barnacle, Technician, Phebe Brasil, CRNA Referring MD:             Demetra Filter McDiarmid MD, Medicines:                Monitored Anesthesia Care Complications:            No immediate complications. Estimated blood loss:                            None. Estimated Blood Loss:     Estimated blood loss: none. Procedure:                Pre-Anesthesia Assessment:                           - Prior to the procedure, a History and Physical                            was performed, and patient medications and                            allergies were reviewed. The patient's tolerance of                            previous anesthesia was also reviewed. The risks                            and benefits of the procedure and the sedation                            options and risks were discussed with the patient.                            All questions were answered, and informed consent                            was obtained. Prior Anticoagulants: The patient has  taken no anticoagulant or antiplatelet agents. ASA                            Grade Assessment: II - A patient with mild systemic                            disease. After reviewing the risks and benefits,                            the patient was deemed in satisfactory condition to                            undergo the procedure.                            After obtaining informed consent, the colonoscope                            was passed under direct vision. Throughout the                            procedure, the patient's blood pressure, pulse, and                            oxygen saturations were monitored continuously. The                            CF-HQ190L ([id number]) Olympus colonoscope was                            introduced through the anus and advanced to the the                            cecum, identified by appendiceal orifice and                            ileocecal valve. The ileocecal valve, appendiceal                            orifice, and rectum were photographed. The quality                            of the bowel preparation was good. The colonoscopy                            was performed without difficulty. The patient                            tolerated the procedure well. The bowel preparation                            used was extensive prep including SUPREP via split  dose instruction. Given antinausea medicine as well. Scope In: 10:18:50 AM Scope Out: 10:36:07 AM Scope Withdrawal Time: 0 hours 10 minutes 2 seconds  Total Procedure Duration: 0 hours 17 minutes 17 seconds  Findings:      The entire examined colon appeared normal on direct and retroflexion       views. Impression:               - The entire examined colon is normal on direct and                            retroflexion views.                           - No specimens collected. Moderate Sedation:      none Recommendation:           - Repeat colonoscopy in 5 years for surveillance                            (history of multiple polyps). Will require                            extensive prep and antinausea medicine.                           - Patient has a contact number available for                            emergencies. The signs and symptoms of potential                            delayed complications  were discussed with the                            patient. Return to normal activities tomorrow.                            Written discharge instructions were provided to the                            patient.                           - Resume previous diet.                           - Continue present medications. Procedure Code(s):        --- Professional ---                           [phone], Colonoscopy, flexible; diagnostic, including                            collection of specimen(s) by brushing or washing,                            when performed (separate procedure) Diagnosis  Code(s):        --- Professional ---                           Z86.010, Personal history of colonic polyps CPT copyright 2022 American Medical Association. All rights reserved. The codes documented in this report are preliminary and upon coder review may  be revised to meet current compliance requirements. Murel Arlington. Elvin Hammer, MD 05/17/2024 10:42:05 AM This report has been signed electronically. Number of Addenda: 0

## 2024-05-17 NOTE — Progress Notes (Signed)
 Pt's blood glucose checked in admitting. CBG was 63, hypoglycemia protocol initiated and anesthesiologist, Dr. Alline Ivans, notified. 1/2 amp of D50 given. Pt's CBG 117 15 min later. Maxie Spaniel, RN

## 2024-05-17 NOTE — Discharge Instructions (Signed)

## 2024-05-17 NOTE — Anesthesia Preprocedure Evaluation (Addendum)
 Anesthesia Evaluation  Patient identified by MRN, date of birth, ID band Patient awake    Reviewed: Allergy & Precautions, NPO status , Patient's Chart, lab work & pertinent test results  Airway Mallampati: II  TM Distance: >3 FB Neck ROM: Full    Dental  (+) Missing   Pulmonary COPD, Current Smoker   Pulmonary exam normal breath sounds clear to auscultation       Cardiovascular hypertension, Pt. on medications + Peripheral Vascular Disease   Rhythm:Regular Rate:Normal     Neuro/Psych  Headaches    GI/Hepatic Neg liver ROS, PUD,GERD  ,,  Endo/Other  diabetes, Type 2, Insulin  Dependent    Renal/GU CRFRenal disease     Musculoskeletal  (+) Arthritis ,    Abdominal   Peds  Hematology  (+) Blood dyscrasia, anemia   Anesthesia Other Findings   Reproductive/Obstetrics                             Anesthesia Physical Anesthesia Plan  ASA: 3  Anesthesia Plan: MAC   Post-op Pain Management:    Induction:   PONV Risk Score and Plan: 1 and Propofol  infusion and Treatment may vary due to age or medical condition  Airway Management Planned: Natural Airway and Simple Face Mask  Additional Equipment:   Intra-op Plan:   Post-operative Plan:   Informed Consent: I have reviewed the patients History and Physical, chart, labs and discussed the procedure including the risks, benefits and alternatives for the proposed anesthesia with the patient or authorized representative who has indicated his/her understanding and acceptance.       Plan Discussed with: CRNA  Anesthesia Plan Comments:        Anesthesia Quick Evaluation

## 2024-05-18 NOTE — Anesthesia Postprocedure Evaluation (Signed)
 Anesthesia Post Note  Patient: [name] [name]  Procedure(s) Performed: COLONOSCOPY     Patient location during evaluation: PACU Anesthesia Type: MAC Level of consciousness: awake and alert Pain management: pain level controlled Vital Signs Assessment: post-procedure vital signs reviewed and stable Respiratory status: spontaneous breathing, nonlabored ventilation, respiratory function stable and patient connected to nasal cannula oxygen Cardiovascular status: stable and blood pressure returned to baseline Postop Assessment: no apparent nausea or vomiting Anesthetic complications: no   No notable events documented.  Last Vitals:  Vitals:   05/17/24 1121 05/17/24 1131  BP: (!) 184/84 (!) 188/93  Pulse: 81 80  Resp: 19 (P) 20  Temp:    SpO2: 100% 100%    Last Pain:  Vitals:   05/17/24 1131  TempSrc:   PainSc: 0-No pain                 Melvenia Stabs

## 2024-05-21 ENCOUNTER — Encounter (HOSPITAL_COMMUNITY): Payer: Self-pay | Admitting: Internal Medicine

## 2024-05-28 ENCOUNTER — Other Ambulatory Visit

## 2024-05-28 ENCOUNTER — Encounter

## 2024-05-30 ENCOUNTER — Encounter: Payer: Self-pay | Admitting: Cardiovascular Disease

## 2024-06-04 ENCOUNTER — Other Ambulatory Visit: Payer: Self-pay | Admitting: Family Medicine

## 2024-06-04 DIAGNOSIS — E1122 Type 2 diabetes mellitus with diabetic chronic kidney disease: Secondary | ICD-10-CM

## 2024-06-07 ENCOUNTER — Other Ambulatory Visit: Payer: Self-pay | Admitting: Family Medicine

## 2024-06-07 DIAGNOSIS — R1115 Cyclical vomiting syndrome unrelated to migraine: Secondary | ICD-10-CM

## 2024-06-07 DIAGNOSIS — E1143 Type 2 diabetes mellitus with diabetic autonomic (poly)neuropathy: Secondary | ICD-10-CM

## 2024-06-08 MED ORDER — OXYCODONE HCL 5 MG PO TABS: 5.0000 mg | ORAL_TABLET | Freq: Four times a day (QID) | ORAL | 0 refills | Status: DC | PRN

## 2024-06-13 ENCOUNTER — Ambulatory Visit
Admission: RE | Admit: 2024-06-13 | Discharge: 2024-06-13 | Disposition: A | Discharge status: Home / Self Care | Source: Ambulatory Visit | Attending: Family Medicine | Admitting: Family Medicine

## 2024-06-13 ENCOUNTER — Encounter

## 2024-06-13 ENCOUNTER — Ambulatory Visit

## 2024-06-13 ENCOUNTER — Other Ambulatory Visit

## 2024-06-13 DIAGNOSIS — R928 Other abnormal and inconclusive findings on diagnostic imaging of breast: Secondary | ICD-10-CM

## 2024-06-14 ENCOUNTER — Encounter: Attending: Physical Medicine & Rehabilitation | Admitting: Physical Medicine & Rehabilitation

## 2024-06-14 DIAGNOSIS — G8929 Other chronic pain: Secondary | ICD-10-CM | POA: Insufficient documentation

## 2024-06-14 DIAGNOSIS — M545 Low back pain, unspecified: Secondary | ICD-10-CM | POA: Insufficient documentation

## 2024-06-18 ENCOUNTER — Other Ambulatory Visit

## 2024-06-18 ENCOUNTER — Encounter

## 2024-06-26 ENCOUNTER — Encounter: Payer: Self-pay | Admitting: Family Medicine

## 2024-06-26 ENCOUNTER — Ambulatory Visit: Admitting: Family Medicine

## 2024-06-26 VITALS — BP 126/55 | HR 72 | Temp 98.6°F | Ht 61.5 in | Wt 160.0 lb

## 2024-06-26 DIAGNOSIS — Z794 Long term (current) use of insulin: Secondary | ICD-10-CM | POA: Diagnosis not present

## 2024-06-26 DIAGNOSIS — E1143 Type 2 diabetes mellitus with diabetic autonomic (poly)neuropathy: Secondary | ICD-10-CM

## 2024-06-26 DIAGNOSIS — N184 Chronic kidney disease, stage 4 (severe): Secondary | ICD-10-CM

## 2024-06-26 DIAGNOSIS — E1159 Type 2 diabetes mellitus with other circulatory complications: Secondary | ICD-10-CM

## 2024-06-26 DIAGNOSIS — R1115 Cyclical vomiting syndrome unrelated to migraine: Secondary | ICD-10-CM

## 2024-06-26 DIAGNOSIS — E1142 Type 2 diabetes mellitus with diabetic polyneuropathy: Secondary | ICD-10-CM

## 2024-06-26 DIAGNOSIS — E1122 Type 2 diabetes mellitus with diabetic chronic kidney disease: Secondary | ICD-10-CM | POA: Diagnosis not present

## 2024-06-26 DIAGNOSIS — K3184 Gastroparesis: Secondary | ICD-10-CM

## 2024-06-26 DIAGNOSIS — E113213 Type 2 diabetes mellitus with mild nonproliferative diabetic retinopathy with macular edema, bilateral: Secondary | ICD-10-CM

## 2024-06-26 DIAGNOSIS — M545 Low back pain, unspecified: Secondary | ICD-10-CM

## 2024-06-26 DIAGNOSIS — E118 Type 2 diabetes mellitus with unspecified complications: Secondary | ICD-10-CM

## 2024-06-26 DIAGNOSIS — I152 Hypertension secondary to endocrine disorders: Secondary | ICD-10-CM

## 2024-06-26 LAB — POCT GLYCOSYLATED HEMOGLOBIN (HGB A1C): HbA1c, POC (controlled diabetic range): 6.1 % (ref 0.0–7.0)

## 2024-06-26 MED ORDER — OXYCODONE HCL 5 MG PO TABS: 5.0000 mg | ORAL_TABLET | Freq: Four times a day (QID) | ORAL | 0 refills | Status: AC | PRN

## 2024-06-26 MED ORDER — LIDOCAINE 5 % EX PTCH
1.0000 | MEDICATED_PATCH | CUTANEOUS | 3 refills | Status: DC
Start: 2024-06-26 — End: 2024-10-18

## 2024-06-26 NOTE — Patient Instructions (Addendum)
 Your A1c is great.  Keept taking your Lantus  as you are doing.   Your blood pressure is doing well.  Keep taking your Diltiazem  and Hydralazine .    Dr Otilio Groleau will work on getting you a continuous glocose monitor covered by your insurance.    Please come by for nursing visits to get your Hepatitis B virus vaccinations (3 shots over 6 months) and your Pneumonia vaccination booster.   You can refill your oxycodone  on Monday of next week.

## 2024-06-27 ENCOUNTER — Encounter: Payer: Self-pay | Admitting: Family Medicine

## 2024-06-27 NOTE — Assessment & Plan Note (Signed)
 Established problem Well Controlled. Patient is at goal of A1c less than 7.5%. No signs of complications, medication side effects, or red flags. Continue Lantus  8 to 12 units daily.  Patient bases daily dose on amount she plans to eat and how her nausea is.

## 2024-06-27 NOTE — Assessment & Plan Note (Signed)
 Established problem. Adequate blood pressure control.  No evidence of new end organ damage.  Tolerating medication without significant adverse effects.  Plan to continue current blood pressure medication regiment.

## 2024-06-27 NOTE — Assessment & Plan Note (Signed)
 Established problem worsened.  Patient is not at goal of nausea control Nausea started this morning.  She has taken her Ativan  portion of her rescue regiment.  She plans to take her phnergan once she gets home as it makes her sleepy.  Patient may fill her oxycodone  portion of her abortive therapy one week early.  This refill was sent in after checking PDMP.

## 2024-06-27 NOTE — Assessment & Plan Note (Signed)
 Established problem Dr [name] [name] has started the preparatory process for home HD with referral to vascular surgery for AV shunt placement [facility] reported by patient as saying she was not a candidate for renal transplant.   Patient will need HBV vaccinations and Prevnar 20 vaccination.  Patient nauseated today, so will ask her to return for nurse visits to start her vaccinations.

## 2024-06-27 NOTE — Progress Notes (Signed)
 [name] [name] is alone Sources of clinical information for visit is/are patient. Nursing assessment for this office visit was reviewed with the patient for accuracy and revision.     Previous Report(s) Reviewed:      06/26/2024   10:14 AM  Depression screen PHQ 2/9  Decreased Interest 0  Down, Depressed, Hopeless 0  PHQ - 2 Score 0  Altered sleeping 0  Tired, decreased energy 0  Change in appetite 0  Feeling bad or failure about yourself  0  Trouble concentrating 0  Moving slowly or fidgety/restless 0  Suicidal thoughts 0  PHQ-9 Score 0  Difficult doing work/chores Not difficult at all   [employer] Office Visit from 06/26/2024 in [facility] [place]. [facility] Office Visit from 03/30/2024 in [facility] Office Visit from 01/03/2024 in [facility] Of . [facility]  Thoughts that you would be better off dead, or of hurting yourself in some way Not at all Not at all Not at all  PHQ-9 Total Score 0 3 5       04/16/2024   11:15 AM 06/14/2023    3:15 PM 09/16/2021    3:01 PM 06/05/2020   10:31 AM 11/16/2018    2:50 PM  Fall Risk   Falls in the past year? 0 0 0 1 0   Number falls in past yr:  0 0 0 0   Injury with Fall?  0 0 1 0  Follow up  Falls evaluation completed;Education provided;Falls prevention discussed        Data saved with a previous flowsheet row definition       06/26/2024   10:14 AM 03/30/2024    1:11 PM 01/03/2024   11:11 AM  PHQ9 SCORE ONLY  PHQ-9 Total Score 0 3 5    There are no preventive care reminders to display for this patient.  Health Maintenance Due  Topic Date Due   Hepatitis B Vaccines (1 of 3 - 19+ 3-dose series) Never done   Zoster Vaccines- Shingrix (1 of 2) Never done   Pneumococcal Vaccine 4-59 Years old (2 of 2 - PCV) 01/20/2018   DTaP/Tdap/Td (4 - Td or Tdap) 09/29/2023   Fecal DNA (Cologuard)  Never done   Medicare Annual  Wellness (AWV)  06/13/2024      History/P.E. limitations: none  There are no preventive care reminders to display for this patient.  Diabetes Health Maintenance Due  Topic Date Due   HEMOGLOBIN A1C  12/27/2024   OPHTHALMOLOGY EXAM  04/03/2025    Health Maintenance Due  Topic Date Due   Hepatitis B Vaccines (1 of 3 - 19+ 3-dose series) Never done   Zoster Vaccines- Shingrix (1 of 2) Never done   Pneumococcal Vaccine 65-58 Years old (2 of 2 - PCV) 01/20/2018   DTaP/Tdap/Td (4 - Td or Tdap) 09/29/2023   Fecal DNA (Cologuard)  Never done   Medicare Annual Wellness (AWV)  06/13/2024     Chief Complaint  Patient presents with   Abdominal Pain   Nausea     --------------------------------------------------------------------------------------------------------------------------------------------- Visit Problem List with A/P  No problem-specific Assessment & Plan notes found for this encounter.

## 2024-06-27 NOTE — Assessment & Plan Note (Addendum)
 Referral placed to Dr Scarlet OD to optometric evaluation for refractive correction in preparation for home hemodialysis.

## 2024-07-02 ENCOUNTER — Telehealth: Payer: Self-pay

## 2024-07-02 ENCOUNTER — Other Ambulatory Visit: Payer: Self-pay | Admitting: Vascular Surgery

## 2024-07-02 DIAGNOSIS — N184 Chronic kidney disease, stage 4 (severe): Secondary | ICD-10-CM

## 2024-07-02 NOTE — Telephone Encounter (Signed)
 Spoke with patient. Made nurse visit for 7/8 for 1st dose of Hep B. 2nd dose is to be given 1 month after 1st dose. Nelson Land, CMA '

## 2024-07-02 NOTE — Telephone Encounter (Signed)
-----   Message from [facility] McDiarmid sent at 06/27/2024 12:30 PM EDT ----- Please schedule [name] [name] for nurse visits to start her Hep B vaccination series and to receive her Prevnar 20 vaccination.  This is in preparation for her starting hemodialysis.   She will need future nursing visits to receive her 2nd and 3rd HBV vaccinations.

## 2024-07-03 ENCOUNTER — Telehealth: Payer: Self-pay

## 2024-07-03 ENCOUNTER — Ambulatory Visit (INDEPENDENT_AMBULATORY_CARE_PROVIDER_SITE_OTHER)

## 2024-07-03 DIAGNOSIS — Z23 Encounter for immunization: Secondary | ICD-10-CM

## 2024-07-03 NOTE — Telephone Encounter (Signed)
 Reviewed and agree with Chiquita Knight' recommendations and plan.

## 2024-07-03 NOTE — Telephone Encounter (Signed)
 Patient presents to nurse clinic today for Hep B and Prevnar 20 vaccinations.   Administered with no complications.   Patient does report significant low back pain since Sunday. She denies recent trauma. Denies fever, hematuria or painful urination.   She describes pain as throbbing and constant. No alleviating factors.   She feels that the back pain is going to trigger her cyclic vomiting syndrome.   I offered to schedule her a same day appointment with one of our providers. She is unable to do so as she has an appointment with her kidney specialist at 11:20.   I told her that I would reach out to PCP regarding concern, however, she would likely need evaluation.   Patient lives in [place] and has difficulty coming in for appointments. Discussed ED/UC precautions.   Patient also asking about her pain medication. She states that she has not heard from the pharmacy regarding pick up.   Called pharmacy to check status. Initially, I was told that patient could not pick up given her last pick up date of 06/08/24.  Advised that per last OV note, Dr. McDiarmid authorized early fill date. They will work on getting this ready for her within the next few hours.   Called patient back and provided with update.   [name], RN

## 2024-07-04 LAB — LAB REPORT - SCANNED: EGFR: 13

## 2024-07-04 NOTE — Progress Notes (Signed)
 Patient presents to clinic for Hep B and Prevnar 20 vaccination.   Temp: 99.1. Spoke with Dr. Rumball regarding concerns with lower back pain and current temperature. Advised that patient could proceed with vaccinations today.   Administered Hep B in RD and Prevnar in LD. Patient tolerated injections well.   Called patient on 07/04/24 to schedule follow up visit for second Hep B vaccine. Patient reports that she was also advised to schedule 1 month follow up visit with PCP in August.   Scheduled patient follow up with Dr. Keneth on 8/14. Advised that patient can receive second vaccine at this time.   [name], RN

## 2024-07-04 NOTE — Telephone Encounter (Signed)
 Called patient to assist in scheduling visit for second Hep B vaccine. I also discussed with Dr. McDiarmid that Hep B vaccine flagged with patient's intolerance to Novolog  (yeast containing product). No severe allergic reaction to Baker's yeast. No contraindications for patient to receive Hep B vaccine. Dr. McDiarmid advised that patient could proceed with second Hep B vaccination.  Scheduled patient on 08/09/24 with PCP.   Patient also states that her back pain has improved since yesterday.   Patient will call back with any additional concerns.   [name], RN

## 2024-07-05 ENCOUNTER — Encounter: Payer: Self-pay | Admitting: Nephrology

## 2024-07-09 ENCOUNTER — Ambulatory Visit: Payer: 59

## 2024-07-09 VITALS — Ht 61.5 in | Wt 154.0 lb

## 2024-07-09 DIAGNOSIS — Z0000 Encounter for general adult medical examination without abnormal findings: Secondary | ICD-10-CM | POA: Diagnosis not present

## 2024-07-09 NOTE — Progress Notes (Signed)
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 Patient advised to discuss non-opioid treatment plan with their provider. Functional ability and status Nutritional status Physical activity Advanced directives List of other physicians Hospitalizations, surgeries, and ER visits in previous 12 months Vitals Screenings to include cognitive, depression, and falls Referrals and appointments  In addition, I have reviewed and discussed with patient certain preventive protocols, quality metrics, and best practice recommendations. A written personalized care plan for preventive services as well as general preventive health recommendations were provided to patient.   Roz LOISE Fuller, LPN   [id number]   After Visit Summary: (MyChart) Due to this being a telephonic visit, the after visit summary with patients personalized plan was offered to patient via MyChart   Notes: Nothing significant to report at this time.

## 2024-07-09 NOTE — Patient Instructions (Signed)
 [name] , Thank you for taking time out of your busy schedule to complete your Annual Wellness Visit with me. I enjoyed our conversation and look forward to speaking with you again next year. I, as well as your care team,  appreciate your ongoing commitment to your health goals. Please review the following plan we discussed and let me know if I can assist you in the future. Your Game plan/ To Do List    Referrals: If you haven't heard from the office you've been referred to, please reach out to them at the phone provided.   Follow up Visits: Next Medicare AWV with our clinical staff: 07/11/2025 at 10:30 a.m. phone visit with Nurse   Have you seen your provider in the last 6 months (3 months if uncontrolled diabetes)? Yes Next Office Visit with your provider: 08/09/2024 at 9:30 a.m. office visit with Dr. McDiarmid  Clinician Recommendations:  Aim for 30 minutes of exercise or brisk walking, 6-8 glasses of water , and 5 servings of fruits and vegetables each day.       This is a list of the screening recommended for you and due dates:  Health Maintenance  Topic Date Due   Zoster (Shingles) Vaccine (1 of 2) Never done   DTaP/Tdap/Td vaccine (4 - Td or Tdap) 09/29/2023   Cologuard (Stool DNA test)  Never done   Flu Shot  07/27/2024   Hepatitis B Vaccine (2 of 2 - CpG 2-dose series) 07/31/2024   Yearly kidney health urinalysis for diabetes  08/17/2024   Screening for Lung Cancer  09/08/2024   Hemoglobin A1C  12/27/2024   Eye exam for diabetics  04/03/2025   Yearly kidney function blood test for diabetes  07/04/2025   Medicare Annual Wellness Visit  07/09/2025   Mammogram  04/24/2026   Colon Cancer Screening  05/18/2027   Pneumococcal Vaccination  Completed   COVID-19 Vaccine  Completed   Hepatitis C Screening  Completed   HIV Screening  Completed   HPV Vaccine  Aged Out   Meningitis B Vaccine  Aged Out    Advanced directives: (Declined) Advance directive discussed with you today.  Even though you declined this today, please call our office should you change your mind, and we can give you the proper paperwork for you to fill out. Advance Care Planning is important because it:  [x]  Makes sure you receive the medical care that is consistent with your values, goals, and preferences  [x]  It provides guidance to your family and loved ones and reduces their decisional burden about whether or not they are making the right decisions based on your wishes.  Follow the link provided in your after visit summary or read over the paperwork we have mailed to you to help you started getting your Advance Directives in place. If you need assistance in completing these, please reach out to us  so that we can help you!  See attachments for Preventive Care and Fall Prevention Tips.
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
## 2024-07-11 ENCOUNTER — Other Ambulatory Visit: Payer: Self-pay

## 2024-07-12 ENCOUNTER — Ambulatory Visit (HOSPITAL_BASED_OUTPATIENT_CLINIC_OR_DEPARTMENT_OTHER)
Admission: RE | Admit: 2024-07-12 | Discharge: 2024-07-12 | Disposition: A | Discharge status: Home / Self Care | Source: Ambulatory Visit | Attending: Vascular Surgery | Admitting: Vascular Surgery

## 2024-07-12 ENCOUNTER — Ambulatory Visit: Attending: Vascular Surgery | Admitting: Vascular Surgery

## 2024-07-12 ENCOUNTER — Encounter: Payer: Self-pay | Admitting: Vascular Surgery

## 2024-07-12 ENCOUNTER — Ambulatory Visit (HOSPITAL_COMMUNITY)
Admission: RE | Admit: 2024-07-12 | Discharge: 2024-07-12 | Disposition: A | Discharge status: Home / Self Care | Source: Ambulatory Visit | Attending: Vascular Surgery | Admitting: Vascular Surgery

## 2024-07-12 VITALS — BP 100/70 | HR 66 | Temp 98.0°F | Resp 18 | Ht 61.5 in | Wt 153.2 lb

## 2024-07-12 DIAGNOSIS — N184 Chronic kidney disease, stage 4 (severe): Secondary | ICD-10-CM

## 2024-07-12 DIAGNOSIS — N185 Chronic kidney disease, stage 5: Secondary | ICD-10-CM | POA: Diagnosis not present

## 2024-07-12 MED ORDER — LORAZEPAM 1 MG PO TABS: 1.0000 mg | ORAL_TABLET | Freq: Three times a day (TID) | ORAL | 5 refills | Status: DC | PRN

## 2024-07-13 ENCOUNTER — Telehealth: Payer: Self-pay

## 2024-07-13 ENCOUNTER — Encounter (HOSPITAL_COMMUNITY): Payer: Self-pay | Admitting: Vascular Surgery

## 2024-07-13 ENCOUNTER — Other Ambulatory Visit: Payer: Self-pay

## 2024-07-13 DIAGNOSIS — N186 End stage renal disease: Secondary | ICD-10-CM

## 2024-07-13 NOTE — Anesthesia Preprocedure Evaluation (Signed)
 Anesthesia Evaluation    Airway        Dental   Pulmonary Current Smoker          Cardiovascular hypertension,      Neuro/Psych    GI/Hepatic   Endo/Other  diabetes    Renal/GU      Musculoskeletal   Abdominal   Peds  Hematology   Anesthesia Other Findings   Reproductive/Obstetrics                              Anesthesia Physical Anesthesia Plan  ASA:   Anesthesia Plan:    Post-op Pain Management:    Induction:   PONV Risk Score and Plan:   Airway Management Planned:   Additional Equipment:   Intra-op Plan:   Post-operative Plan:   Informed Consent:   Plan Discussed with:   Anesthesia Plan Comments: (PAT note written 07/13/2024 by Tarynn Garling, PA-C.  )        Anesthesia Quick Evaluation

## 2024-07-13 NOTE — Telephone Encounter (Signed)
 Attempted to call for surgery scheduling. LVM
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
## 2024-07-13 NOTE — Progress Notes (Signed)
 SDW CALL  Patient was given pre-op  instructions over the phone. The opportunity was given for the patient to ask questions. No further questions asked. Patient verbalized understanding of instructions given.   PCP - Krystal McDiarmid,MD Cardiologist - Dorn Court COME  PPM/ICD - denies Device Orders -  Rep Notified -   Chest x-ray - CT chest-09/09/23 EKG - 07/14/2023-will obtain DOS Stress Test - denies ECHO - 05/19/2016 Cardiac Cath - 2008  Sleep Study - denies CPAP -   Fasting Blood Sugar - does not check her sugar and does not have a meter. Checks Blood Sugar _____ times a day  Blood Thinner Instructions:na Aspirin  Instructions:na  ERAS Protcol -no PRE-SURGERY Ensure or G2-   COVID TEST- na   Anesthesia review: yes-cardiac history,hx NSTEMI  Patient denies shortness of breath, fever, cough and chest pain over the phone call   Special instructions:    Oral Hygiene is also important to reduce your risk of infection.  Remember - BRUSH YOUR TEETH THE MORNING OF SURGERY WITH YOUR REGULAR TOOTHPASTE

## 2024-07-16 ENCOUNTER — Encounter (HOSPITAL_COMMUNITY): Payer: Self-pay | Admitting: Vascular Surgery

## 2024-07-16 ENCOUNTER — Encounter (HOSPITAL_COMMUNITY)
Admission: RE | Disposition: A | Discharge status: Home / Self Care | Payer: Self-pay | Source: Home / Self Care | Attending: Vascular Surgery

## 2024-07-16 ENCOUNTER — Other Ambulatory Visit: Payer: Self-pay

## 2024-07-16 ENCOUNTER — Ambulatory Visit (HOSPITAL_COMMUNITY)
Admission: RE | Admit: 2024-07-16 | Discharge: 2024-07-16 | Disposition: A | Discharge status: Home / Self Care | Attending: Vascular Surgery | Admitting: Vascular Surgery

## 2024-07-16 DIAGNOSIS — N186 End stage renal disease: Secondary | ICD-10-CM | POA: Diagnosis present

## 2024-07-16 DIAGNOSIS — I252 Old myocardial infarction: Secondary | ICD-10-CM | POA: Diagnosis not present

## 2024-07-16 DIAGNOSIS — E1122 Type 2 diabetes mellitus with diabetic chronic kidney disease: Secondary | ICD-10-CM | POA: Diagnosis not present

## 2024-07-16 DIAGNOSIS — I120 Hypertensive chronic kidney disease with stage 5 chronic kidney disease or end stage renal disease: Secondary | ICD-10-CM | POA: Diagnosis not present

## 2024-07-16 DIAGNOSIS — Z539 Procedure and treatment not carried out, unspecified reason: Secondary | ICD-10-CM | POA: Insufficient documentation

## 2024-07-16 LAB — POCT I-STAT, CHEM 8
BUN: 65 mg/dL — ABNORMAL HIGH (ref 6–20)
Calcium, Ion: 1.17 mmol/L (ref 1.15–1.40)
Chloride: 112 mmol/L — ABNORMAL HIGH (ref 98–111)
Creatinine, Ser: 5.1 mg/dL — ABNORMAL HIGH (ref 0.44–1.00)
Glucose, Bld: 237 mg/dL — ABNORMAL HIGH (ref 70–99)
HCT: 32 % — ABNORMAL LOW (ref 36.0–46.0)
Hemoglobin: 10.9 g/dL — ABNORMAL LOW (ref 12.0–15.0)
Potassium: 5.7 mmol/L — ABNORMAL HIGH (ref 3.5–5.1)
Sodium: 138 mmol/L (ref 135–145)
TCO2: 18 mmol/L — ABNORMAL LOW (ref 22–32)

## 2024-07-16 LAB — GLUCOSE, CAPILLARY: Glucose-Capillary: 246 mg/dL — ABNORMAL HIGH (ref 70–99)

## 2024-07-16 SURGERY — INSERTION OF ARTERIOVENOUS (AV) ARTEGRAFT ARM
Anesthesia: Monitor Anesthesia Care | Laterality: Right

## 2024-07-16 MED ORDER — VANCOMYCIN HCL IN DEXTROSE 1-5 GM/200ML-% IV SOLN
1000.0000 mg | INTRAVENOUS | Status: DC
  Filled 2024-07-16: qty 200

## 2024-07-16 MED ORDER — SODIUM CHLORIDE 0.9 % IV SOLN: INTRAVENOUS | Status: DC

## 2024-07-16 MED ORDER — ORAL CARE MOUTH RINSE: 15.0000 mL | Freq: Once | OROMUCOSAL | Status: AC

## 2024-07-16 MED ORDER — CHLORHEXIDINE GLUCONATE 4 % EX SOLN: 60.0000 mL | Freq: Once | CUTANEOUS | Status: DC

## 2024-07-16 MED ORDER — INSULIN ASPART 100 UNIT/ML IJ SOLN
0.0000 [IU] | INTRAMUSCULAR | Status: DC | PRN
  Administered 2024-07-16: 6 [IU] via SUBCUTANEOUS

## 2024-07-16 MED ORDER — ACETAMINOPHEN 500 MG PO TABS
1000.0000 mg | ORAL_TABLET | Freq: Once | ORAL | Status: DC
  Filled 2024-07-16: qty 2

## 2024-07-16 MED ORDER — CHLORHEXIDINE GLUCONATE 0.12 % MT SOLN
15.0000 mL | Freq: Once | OROMUCOSAL | Status: AC
  Administered 2024-07-16: 15 mL via OROMUCOSAL
  Filled 2024-07-16: qty 15

## 2024-07-16 MED ORDER — ONDANSETRON HCL 4 MG/2ML IJ SOLN
4.0000 mg | Freq: Once | INTRAMUSCULAR | Status: AC
  Administered 2024-07-16: 4 mg via INTRAVENOUS
  Filled 2024-07-16: qty 2

## 2024-07-16 NOTE — Progress Notes (Signed)
 Today's ISTAT result reviewed by Dr. Boone.  Dr. Boone is aware potassium level is 5.7. No new orders received at this time.

## 2024-07-16 NOTE — Progress Notes (Signed)
 Potassium level on istat 5.7. Discussed with Dr. Lanis and Dr. Boone and surgery was cancelled. Patient instructed to call nephrologist for further instructions.

## 2024-07-16 NOTE — Progress Notes (Signed)
 K+ on iStat = 5.7  Discussed with Dr. Lanis and patient. Safest thing would be to postpone surgery.   Discussed with patient to call her nephrologist and see their recommendations today. If not able to be reached, I did recommend going to the Emergency Department. I explained the risks of not having this at least evaluated. She understood and said she would call her nephrologist today.

## 2024-07-31 ENCOUNTER — Other Ambulatory Visit: Payer: Self-pay | Admitting: Family Medicine

## 2024-07-31 ENCOUNTER — Ambulatory Visit

## 2024-07-31 DIAGNOSIS — E1122 Type 2 diabetes mellitus with diabetic chronic kidney disease: Secondary | ICD-10-CM

## 2024-08-03 ENCOUNTER — Telehealth: Payer: Self-pay

## 2024-08-03 DIAGNOSIS — R1115 Cyclical vomiting syndrome unrelated to migraine: Secondary | ICD-10-CM

## 2024-08-03 MED ORDER — OXYCODONE HCL 5 MG PO TABS: 5.0000 mg | ORAL_TABLET | ORAL | 0 refills | Status: DC | PRN

## 2024-08-03 NOTE — Telephone Encounter (Signed)
 Refill sent.

## 2024-08-03 NOTE — Telephone Encounter (Signed)
 Patient calls nurse line requesting refill on oxycodone  prescription.   This is not on current medication list.   Will forward to PCP.   Please send refill to [facility].   [name], RN

## 2024-08-09 ENCOUNTER — Ambulatory Visit (INDEPENDENT_AMBULATORY_CARE_PROVIDER_SITE_OTHER): Admitting: Family Medicine

## 2024-08-09 ENCOUNTER — Encounter: Payer: Self-pay | Admitting: Family Medicine

## 2024-08-09 ENCOUNTER — Telehealth: Payer: Self-pay

## 2024-08-09 VITALS — BP 138/64 | HR 62 | Ht 61.0 in | Wt 152.4 lb

## 2024-08-09 DIAGNOSIS — E875 Hyperkalemia: Secondary | ICD-10-CM

## 2024-08-09 DIAGNOSIS — R1115 Cyclical vomiting syndrome unrelated to migraine: Secondary | ICD-10-CM

## 2024-08-09 DIAGNOSIS — Z23 Encounter for immunization: Secondary | ICD-10-CM | POA: Diagnosis not present

## 2024-08-09 DIAGNOSIS — F17210 Nicotine dependence, cigarettes, uncomplicated: Secondary | ICD-10-CM | POA: Diagnosis not present

## 2024-08-09 DIAGNOSIS — E1159 Type 2 diabetes mellitus with other circulatory complications: Secondary | ICD-10-CM

## 2024-08-09 DIAGNOSIS — F325 Major depressive disorder, single episode, in full remission: Secondary | ICD-10-CM | POA: Diagnosis not present

## 2024-08-09 DIAGNOSIS — L989 Disorder of the skin and subcutaneous tissue, unspecified: Secondary | ICD-10-CM

## 2024-08-09 DIAGNOSIS — N184 Chronic kidney disease, stage 4 (severe): Secondary | ICD-10-CM

## 2024-08-09 DIAGNOSIS — Z720 Tobacco use: Secondary | ICD-10-CM

## 2024-08-09 DIAGNOSIS — I152 Hypertension secondary to endocrine disorders: Secondary | ICD-10-CM

## 2024-08-09 MED ORDER — MUPIROCIN 2 % EX OINT
TOPICAL_OINTMENT | CUTANEOUS | 3 refills | Status: DC
Start: 2024-08-09 — End: 2024-10-18

## 2024-08-09 MED ORDER — LORAZEPAM 1 MG PO TABS
1.0000 mg | ORAL_TABLET | Freq: Three times a day (TID) | ORAL | 5 refills | Status: DC | PRN
Start: 2024-08-09 — End: 2024-09-06

## 2024-08-09 MED ORDER — NEPRO PO LIQD: 1.0000 | Freq: Two times a day (BID) | ORAL | 0 refills | Status: AC | PRN

## 2024-08-09 NOTE — Progress Notes (Signed)
 [name] [name] is alone Sources of clinical information for visit is/are patient. Nursing assessment for this office visit was reviewed with the patient for accuracy and revision.   Previous Report(s) Reviewed: 07/16/24 labs by vascular surgery    07/09/2024   10:43 AM  Depression screen PHQ 2/9  Decreased Interest 0  Down, Depressed, Hopeless 0  PHQ - 2 Score 0  Altered sleeping 0  Tired, decreased energy 0  Change in appetite 0  Feeling bad or failure about yourself  0  Trouble concentrating 0  Moving slowly or fidgety/restless 0  Suicidal thoughts 0  PHQ-9 Score 0  Difficult doing work/chores Not difficult at all   Flowsheet Row Clinical Support from 07/09/2024 in [facility] - [facility] [place]. [facility] Office Visit from 06/26/2024 in [facility] - [facility] Of [place]. [facility] Office Visit from 03/30/2024 in [facility]  Thoughts that you would be better off dead, or of hurting yourself in some way Not at all Not at all Not at all  PHQ-9 Total Score 0 0 3       07/09/2024   10:40 AM 04/16/2024   11:15 AM 06/14/2023    3:15 PM 09/16/2021    3:01 PM 06/05/2020   10:31 AM  Fall Risk   Falls in the past year? 1 0 0 0 1  Number falls in past yr: 0  0 0 0  Injury with Fall? 1  0 0 1  Comment LEFT SIDE      Follow up Falls evaluation completed;Education provided  Falls evaluation completed;Education provided;Falls prevention discussed         07/09/2024   10:43 AM 06/26/2024   10:14 AM 03/30/2024    1:11 PM  PHQ9 SCORE ONLY  PHQ-9 Total Score 0 0 3    There are no preventive care reminders to display for this patient.  Health Maintenance Due  Topic Date Due   Zoster Vaccines- Shingrix (1 of 2) Never done   DTaP/Tdap/Td (4 - Td or Tdap) 09/29/2023   Fecal DNA (Cologuard)  Never done   Hepatitis B Vaccines 19-59 Average Risk (2 of 2 - CpG 2-dose series) 07/31/2024   INFLUENZA VACCINE   07/27/2024   Lung Cancer Screening  09/08/2024      History/P.E. limitations: none  There are no preventive care reminders to display for this patient.  Diabetes Health Maintenance Due  Topic Date Due   HEMOGLOBIN A1C  12/27/2024   OPHTHALMOLOGY EXAM  04/03/2025    Health Maintenance Due  Topic Date Due   Zoster Vaccines- Shingrix (1 of 2) Never done   DTaP/Tdap/Td (4 - Td or Tdap) 09/29/2023   Fecal DNA (Cologuard)  Never done   Hepatitis B Vaccines 19-59 Average Risk (2 of 2 - CpG 2-dose series) 07/31/2024   INFLUENZA VACCINE  07/27/2024   Lung Cancer Screening  09/08/2024     No chief complaint on file.    Discussed the use of AI scribe software for clinical note transcription with the patient, who gave verbal consent to proceed.  History of Present Illness      SDOH Screenings   Food Insecurity: No Food Insecurity (07/09/2024)  Housing: Low Risk  (07/09/2024)  Transportation Needs: No Transportation Needs (07/09/2024)  Utilities: Not At Risk (07/09/2024)  Alcohol Screen: Low Risk  (07/09/2024)  Depression (PHQ2-9): Low Risk  (07/09/2024)  Financial Resource Strain:  Low Risk  (07/09/2024)  Physical Activity: Inactive (07/09/2024)  Social Connections: Socially Isolated (07/09/2024)  Stress: No Stress Concern Present (07/09/2024)  Tobacco Use: High Risk (08/09/2024)   --------------------------------------------------------------------------------------------------------------------------------------------- Visit Problem List with Assessment and Plan   Assessment and Plan Assessment & Plan      No problem-specific Assessment & Plan notes found for this encounter.

## 2024-08-09 NOTE — Telephone Encounter (Signed)
 Spoke with patient. Informed of CT scan at [facility] on Mon. Sept. 15th at 9:20a. Sending out reminder letter via mail. Nelson Land, CMA

## 2024-08-09 NOTE — Patient Instructions (Signed)
 We are rechecking your potassium today.  If it is high, Dr Kevonna Nolte will call in a medication called Lokelma  for you to take to lower the potassium.  I will also let Dr Marlee know about this issue with your potassium  A prescription was sent to [facility] for a nutritional supplement called Nepro.  It is designed for people with kidney problems.   [facility] will contact you to schedule your screening Lung CAT scan screening for Lung Cancer.   Dr Janiya Millirons will contact our pharmacy team to see which glucose measuring device is covered by your insurance.

## 2024-08-10 ENCOUNTER — Encounter: Payer: Self-pay | Admitting: Family Medicine

## 2024-08-10 LAB — RENAL FUNCTION PANEL
Albumin: 4.4 g/dL (ref 3.8–4.9)
BUN/Creatinine Ratio: 10 (ref 9–23)
BUN: 44 mg/dL — ABNORMAL HIGH (ref 6–24)
CO2: 18 mmol/L — ABNORMAL LOW (ref 20–29)
Calcium: 9.6 mg/dL (ref 8.7–10.2)
Chloride: 106 mmol/L (ref 96–106)
Creatinine, Ser: 4.22 mg/dL — ABNORMAL HIGH (ref 0.57–1.00)
Glucose: 82 mg/dL (ref 70–99)
Phosphorus: 3.8 mg/dL (ref 3.0–4.3)
Potassium: 5.5 mmol/L — ABNORMAL HIGH (ref 3.5–5.2)
Sodium: 137 mmol/L (ref 134–144)
eGFR: 12 mL/min/1.73 — ABNORMAL LOW (ref >59)

## 2024-08-10 MED ORDER — LOKELMA 10 G PO PACK: 10.0000 g | PACK | Freq: Every day | ORAL | 0 refills | Status: AC

## 2024-08-10 NOTE — Assessment & Plan Note (Signed)
Established problem Well Controlled. No signs of complications, medication side effects, or red flags. Continue current medications and other regiments.  

## 2024-08-10 NOTE — Assessment & Plan Note (Signed)
 Persistently elevated potassium K+ 5.5 today, 5.7 on 7/21 preop for AV graft creation by Dr Lanis. Surgery was canceled.  Patient reports low potassium diet.   Patient with Chronic Kidney Disease 4 with low serum bicarb.  Not taking sodium bicarb per patient.   Recommend Lokelma  10 mg daily x 3 days with f/u with Dr Marlee (Nephro) on 08/16/24.

## 2024-08-10 NOTE — Assessment & Plan Note (Signed)
 Established problem. Stable. Patient is at goal of exacerbation symptom control. No signs of complications, medication side effects, or red flags. Continue abortive therapy of lorazepam /oxycodone /phenergan 

## 2024-08-16 LAB — BASIC METABOLIC PANEL WITH GFR
BUN: 51 — AB (ref 4–21)
CO2: 25 — AB (ref 13–22)
Chloride: 107 (ref 99–108)
Creatinine: 4.3 — AB (ref 0.5–1.1)
Glucose: 87
Potassium: 5.1 meq/L (ref 3.5–5.1)
Sodium: 142 (ref 137–147)

## 2024-08-16 LAB — COMPREHENSIVE METABOLIC PANEL WITH GFR
Albumin: 4 (ref 3.5–5.0)
Calcium: 8.2 — AB (ref 8.7–10.7)
eGFR: 14

## 2024-08-20 ENCOUNTER — Encounter: Payer: Self-pay | Admitting: Family Medicine

## 2024-08-28 ENCOUNTER — Encounter (HOSPITAL_BASED_OUTPATIENT_CLINIC_OR_DEPARTMENT_OTHER): Payer: Self-pay

## 2024-08-28 ENCOUNTER — Other Ambulatory Visit: Payer: Self-pay

## 2024-08-28 ENCOUNTER — Emergency Department (HOSPITAL_BASED_OUTPATIENT_CLINIC_OR_DEPARTMENT_OTHER)
Admission: EM | Admit: 2024-08-28 | Discharge: 2024-08-28 | Disposition: A | Discharge status: Home / Self Care | Attending: Emergency Medicine | Admitting: Emergency Medicine

## 2024-08-28 ENCOUNTER — Emergency Department (HOSPITAL_BASED_OUTPATIENT_CLINIC_OR_DEPARTMENT_OTHER)

## 2024-08-28 ENCOUNTER — Emergency Department (HOSPITAL_BASED_OUTPATIENT_CLINIC_OR_DEPARTMENT_OTHER): Admitting: Radiology

## 2024-08-28 ENCOUNTER — Ambulatory Visit: Payer: Self-pay

## 2024-08-28 ENCOUNTER — Telehealth: Payer: Self-pay

## 2024-08-28 DIAGNOSIS — N189 Chronic kidney disease, unspecified: Secondary | ICD-10-CM | POA: Insufficient documentation

## 2024-08-28 DIAGNOSIS — R519 Headache, unspecified: Secondary | ICD-10-CM | POA: Diagnosis not present

## 2024-08-28 DIAGNOSIS — M549 Dorsalgia, unspecified: Secondary | ICD-10-CM | POA: Insufficient documentation

## 2024-08-28 DIAGNOSIS — R109 Unspecified abdominal pain: Secondary | ICD-10-CM | POA: Insufficient documentation

## 2024-08-28 DIAGNOSIS — I129 Hypertensive chronic kidney disease with stage 1 through stage 4 chronic kidney disease, or unspecified chronic kidney disease: Secondary | ICD-10-CM | POA: Insufficient documentation

## 2024-08-28 DIAGNOSIS — R1110 Vomiting, unspecified: Secondary | ICD-10-CM | POA: Diagnosis present

## 2024-08-28 DIAGNOSIS — R1111 Vomiting without nausea: Secondary | ICD-10-CM | POA: Insufficient documentation

## 2024-08-28 LAB — TROPONIN T, HIGH SENSITIVITY
Troponin T High Sensitivity: 39 ng/L — ABNORMAL HIGH (ref 0–19)
Troponin T High Sensitivity: 40 ng/L — ABNORMAL HIGH (ref 0–19)

## 2024-08-28 LAB — COMPREHENSIVE METABOLIC PANEL WITH GFR
ALT: 10 U/L (ref 0–44)
AST: 19 U/L (ref 15–41)
Albumin: 4.8 g/dL (ref 3.5–5.0)
Alkaline Phosphatase: 102 U/L (ref 38–126)
Anion gap: 15 (ref 5–15)
BUN: 48 mg/dL — ABNORMAL HIGH (ref 6–20)
CO2: 19 mmol/L — ABNORMAL LOW (ref 22–32)
Calcium: 10.6 mg/dL — ABNORMAL HIGH (ref 8.9–10.3)
Chloride: 105 mmol/L (ref 98–111)
Creatinine, Ser: 4.31 mg/dL — ABNORMAL HIGH (ref 0.44–1.00)
GFR, Estimated: 11 mL/min — ABNORMAL LOW (ref >60)
Glucose, Bld: 227 mg/dL — ABNORMAL HIGH (ref 70–99)
Potassium: 4.5 mmol/L (ref 3.5–5.1)
Sodium: 139 mmol/L (ref 135–145)
Total Bilirubin: 0.4 mg/dL (ref 0.0–1.2)
Total Protein: 8 g/dL (ref 6.5–8.1)

## 2024-08-28 LAB — RESP PANEL BY RT-PCR (RSV, FLU A&B, COVID)  RVPGX2
Influenza A by PCR: NEGATIVE
Influenza B by PCR: NEGATIVE
Resp Syncytial Virus by PCR: NEGATIVE
SARS Coronavirus 2 by RT PCR: NEGATIVE

## 2024-08-28 LAB — CBC
HCT: 31.5 % — ABNORMAL LOW (ref 36.0–46.0)
Hemoglobin: 10.5 g/dL — ABNORMAL LOW (ref 12.0–15.0)
MCH: 29.7 pg (ref 26.0–34.0)
MCHC: 33.3 g/dL (ref 30.0–36.0)
MCV: 89.2 fL (ref 80.0–100.0)
Platelets: 303 K/uL (ref 150–400)
RBC: 3.53 MIL/uL — ABNORMAL LOW (ref 3.87–5.11)
RDW: 15.3 % (ref 11.5–15.5)
WBC: 7.8 K/uL (ref 4.0–10.5)
nRBC: 0 % (ref 0.0–0.2)

## 2024-08-28 LAB — URINALYSIS, ROUTINE W REFLEX MICROSCOPIC
Bacteria, UA: NONE SEEN
Bilirubin Urine: NEGATIVE
Glucose, UA: NEGATIVE mg/dL
Ketones, ur: NEGATIVE mg/dL
Nitrite: NEGATIVE
Protein, ur: 100 mg/dL — AB
Specific Gravity, Urine: 1.012 (ref 1.005–1.030)
pH: 6.5 (ref 5.0–8.0)

## 2024-08-28 LAB — CBG MONITORING, ED: Glucose-Capillary: 70 mg/dL (ref 70–99)

## 2024-08-28 LAB — LIPASE, BLOOD: Lipase: 102 U/L — ABNORMAL HIGH (ref 11–51)

## 2024-08-28 MED ORDER — HYDROMORPHONE HCL 1 MG/ML IJ SOLN
0.5000 mg | Freq: Once | INTRAMUSCULAR | Status: AC
  Administered 2024-08-28: 0.5 mg via INTRAVENOUS
  Filled 2024-08-28: qty 1

## 2024-08-28 MED ORDER — DROPERIDOL 2.5 MG/ML IJ SOLN
1.2500 mg | Freq: Once | INTRAMUSCULAR | Status: AC
  Administered 2024-08-28: 1.25 mg via INTRAVENOUS
  Filled 2024-08-28: qty 2

## 2024-08-28 MED ORDER — ONDANSETRON 4 MG PO TBDP
4.0000 mg | ORAL_TABLET | Freq: Once | ORAL | Status: DC
  Filled 2024-08-28: qty 1

## 2024-08-28 MED ORDER — PROMETHAZINE HCL 25 MG/ML IJ SOLN
INTRAMUSCULAR | Status: AC
  Filled 2024-08-28: qty 1

## 2024-08-28 MED ORDER — SODIUM CHLORIDE 0.9 % IV SOLN
8.0000 mg | Freq: Once | INTRAVENOUS | Status: AC
  Administered 2024-08-28: 8 mg via INTRAVENOUS
  Filled 2024-08-28: qty 4

## 2024-08-28 MED ORDER — SODIUM CHLORIDE 0.9 % IV BOLUS
500.0000 mL | Freq: Once | INTRAVENOUS | Status: AC
  Administered 2024-08-28: 500 mL via INTRAVENOUS

## 2024-08-28 MED ORDER — ONDANSETRON HCL 4 MG/2ML IJ SOLN
INTRAMUSCULAR | Status: DC
Start: 2024-08-28 — End: 2024-08-29
  Filled 2024-08-28: qty 4

## 2024-08-28 MED ORDER — HYDROMORPHONE HCL 1 MG/ML IJ SOLN
1.0000 mg | Freq: Once | INTRAMUSCULAR | Status: AC
  Administered 2024-08-28: 1 mg via INTRAVENOUS
  Filled 2024-08-28: qty 1

## 2024-08-28 MED ORDER — DILTIAZEM HCL ER COATED BEADS 180 MG PO CP24: 180.0000 mg | ORAL_CAPSULE | Freq: Once | ORAL | Status: DC

## 2024-08-28 MED ORDER — PROMETHAZINE (PHENERGAN) 6.25MG IN NS 50ML IVPB
6.2500 mg | Freq: Once | INTRAVENOUS | Status: AC
  Administered 2024-08-28: 6.25 mg via INTRAVENOUS
  Filled 2024-08-28: qty 50

## 2024-08-28 NOTE — ED Provider Notes (Signed)
 [place] EMERGENCY DEPARTMENT AT [facility] Provider Note   CSN: [id number] Arrival date & time: 08/28/24  1016     Patient presents with: Vomiting and Bodyaches   [name] [name] is a 58 y.o. female.  58 year old female presents to ED with complaints of vomiting, abdominal pain, back pain, and headache.  Patient reports symptoms began 2 days but this morning her legs began to hurt as well.  Patient reports the leg pain has anything like she has ever experienced before.  Patient has significant history of hypertension, CKD, cyclic vomiting disorder.  Patient also reports history of heart attack 15 years ago advised she has been compliant with medication.     Prior to Admission medications   Medication Sig Start Date End Date Taking? Authorizing Provider  LOKELMA  5 g packet Take 1 packet by mouth daily. 08/17/24  Yes [provider]  sodium bicarbonate 650 MG tablet Take 650 mg by mouth 2 (two) times daily. 08/28/24  Yes [provider]  TRADJENTA  5 MG TABS tablet Take 5 mg by mouth daily. 07/04/24  Yes [provider]  clobetasol  ointment (TEMOVATE ) 0.05 % APPLY 1 APPLICATION TOPICALLY  TWICE DAILY 11/28/23   McDiarmid, Krystal BIRCH, MD  conjugated estrogens  (PREMARIN ) vaginal cream Place 1 Applicatorful vaginally daily. 03/06/24   Cleotilde Perkins, DO  diltiazem  (CARDIZEM  CD) 180 MG 24 hr capsule TAKE 1 CAPSULE BY MOUTH EVERY DAY 04/06/24   McDiarmid, Krystal BIRCH, MD  hydrALAZINE  (APRESOLINE ) 25 MG tablet TAKE 1 TABLET BY MOUTH 3 TIMES  DAILY 11/30/23   McDiarmid, Krystal BIRCH, MD  insulin  glargine (LANTUS  SOLOSTAR) 100 UNIT/ML Solostar Pen Inject 12-14 Units into the skin daily. 06/26/24   McDiarmid, Krystal BIRCH, MD  Insulin  Pen Needle (B-D UF III MINI PEN NEEDLES) 31G X 5 MM MISC USE TWICE DAILY AS DIRECTED 06/10/23   McDiarmid, Krystal BIRCH, MD  Insulin  Pen Needle (NOVOFINE PLUS PEN NEEDLE) 32G X 4 MM MISC [place] injection twice per day. 07/16/21   McDiarmid, Krystal BIRCH, MD  lidocaine  (LIDODERM ) 5  % Place 1 patch onto the skin daily. Remove & Discard patch within 12 hours or as directed by MD 06/26/24   McDiarmid, Krystal BIRCH, MD  linaclotide  (LINZESS ) 290 MCG CAPS capsule Take 1 capsule (290 mcg total) by mouth daily before breakfast. 04/17/24 08/09/24  May, Deanna J, NP  LORazepam  (ATIVAN ) 1 MG tablet Take 1 tablet (1 mg total) by mouth every 8 (eight) hours as needed (nausea or vomiting). TAKE 1 TABLET(1 MG) BY MOUTH EVERY 8 HOURS AS NEEDED FOR NAUSEA OR VOMITING 08/09/24   McDiarmid, Krystal BIRCH, MD  mupirocin  ointment (BACTROBAN ) 2 % Apply to affected area TID for 7 days. 08/09/24   McDiarmid, Krystal BIRCH, MD  Nutritional Supplements (NEPRO) LIQD Take 1 Can by mouth 2 (two) times daily as needed. 08/09/24   McDiarmid, Krystal BIRCH, MD  oxyCODONE  (ROXICODONE ) 5 MG immediate release tablet Take 1 tablet (5 mg total) by mouth every 4 (four) hours as needed (abdominal pain with vomiting). 08/03/24   McDiarmid, Krystal BIRCH, MD  promethazine  (PHENERGAN ) 25 MG suppository Place 1 suppository (25 mg total) rectally every 6 (six) hours as needed for nausea or vomiting. 09/27/23   McDiarmid, Krystal BIRCH, MD  promethazine  (PHENERGAN ) 25 MG tablet TAKE 1 TABLET BY MOUTH EVERY 6  HOURS AS NEEDED FOR NAUSEA OR  VOMITING 03/29/24   McDiarmid, Krystal BIRCH, MD  QUEtiapine  (SEROQUEL ) 50 MG tablet TAKE 1 TABLET BY MOUTH AT  BEDTIME  11/28/23   McDiarmid, Krystal BIRCH, MD    Allergies: Bee venom, Silver, Acetaminophen , Novolog  [insulin  aspart (human analog) (yeast)], Semaglutide , Statins, Amlodipine , Clonidine  derivatives, Flexeril  [cyclobenzaprine ], Lyrica  [pregabalin ], Nsaids, Ozempic  (0.25 or 0.5 mg-dose) [semaglutide (0.25 or 0.5mg -dos)], Tape, Ceftriaxone , Erythromycin, and Repatha  [evolocumab ]    Review of Systems  Constitutional:  Positive for diaphoresis.  Gastrointestinal:  Positive for abdominal pain, nausea and vomiting.  Neurological:  Positive for headaches.  All other systems reviewed and are negative.   Updated Vital Signs BP (!) 179/78    Pulse 73   Temp 98.1 F (36.7 C) (Oral)   Resp 15   Ht 5' 1 (1.549 m)   Wt 68.9 kg   SpO2 100%   BMI 28.72 kg/m   Physical Exam Vitals and nursing note reviewed.  Constitutional:      Appearance: Normal appearance.  HENT:     Head: Normocephalic and atraumatic.     Nose: Nose normal.     Mouth/Throat:     Mouth: Mucous membranes are dry.  Eyes:     Extraocular Movements: Extraocular movements intact.     Conjunctiva/sclera: Conjunctivae normal.     Pupils: Pupils are equal, round, and reactive to light.  Cardiovascular:     Rate and Rhythm: Normal rate.  Pulmonary:     Effort: Pulmonary effort is normal. No respiratory distress.  Abdominal:     General: Abdomen is flat.     Palpations: Abdomen is soft.     Tenderness: There is abdominal tenderness.  Musculoskeletal:        General: Tenderness present. No swelling, deformity or signs of injury. Normal range of motion.     Cervical back: Normal range of motion.     Right lower leg: No edema.     Left lower leg: No edema.  Skin:    General: Skin is warm and dry.     Capillary Refill: Capillary refill takes less than 2 seconds.  Neurological:     General: No focal deficit present.     Mental Status: She is alert.  Psychiatric:        Mood and Affect: Mood normal.        Behavior: Behavior normal.     (all labs ordered are listed, but only abnormal results are displayed) Labs Reviewed  LIPASE, BLOOD - Abnormal; Notable for the following components:      Result Value   Lipase 102 (*)    All other components within normal limits  COMPREHENSIVE METABOLIC PANEL WITH GFR - Abnormal; Notable for the following components:   CO2 19 (*)    Glucose, Bld 227 (*)    BUN 48 (*)    Creatinine, Ser 4.31 (*)    Calcium  10.6 (*)    GFR, Estimated 11 (*)    All other components within normal limits  CBC - Abnormal; Notable for the following components:   RBC 3.53 (*)    Hemoglobin 10.5 (*)    HCT 31.5 (*)    All other  components within normal limits  TROPONIN T, HIGH SENSITIVITY - Abnormal; Notable for the following components:   Troponin T High Sensitivity 39 (*)    All other components within normal limits  TROPONIN T, HIGH SENSITIVITY - Abnormal; Notable for the following components:   Troponin T High Sensitivity 40 (*)    All other components within normal limits  RESP PANEL BY RT-PCR (RSV, FLU A&B, COVID)  RVPGX2  URINALYSIS, ROUTINE W REFLEX MICROSCOPIC  EKG: EKG Interpretation Date/Time:  Tuesday August 28 2024 13:20:36 EDT Ventricular Rate:  94 PR Interval:  169 QRS Duration:  79 QT Interval:  373 QTC Calculation: 467 R Axis:   77  Text Interpretation: Sinus rhythm LAE, consider biatrial enlargement Low voltage, precordial leads Probable anteroseptal infarct, old Confirmed by Patsey Lot [phone]) on 08/28/2024 3:19:39 PM  Radiology: [facility] Hip Unilat W or Wo Pelvis 2-3 Views Left Result Date: 08/28/2024 CLINICAL DATA:  Left hip pain. EXAM: DG HIP (WITH OR WITHOUT PELVIS) 2-3V LEFT COMPARISON:  Radiograph dated 06/04/2022. FINDINGS: No acute fracture or dislocation. The bones are well mineralized. No arthritic changes. The soft tissues are unremarkable. Vascular atherosclerotic disease noted. IMPRESSION: No acute findings. Electronically Signed   By: Vanetta Chou M.D.   On: 08/28/2024 17:16   DG Chest Portable 1 View Result Date: 08/28/2024 CLINICAL DATA:  Chest pain. EXAM: PORTABLE CHEST 1 VIEW COMPARISON:  September 03, 2021. FINDINGS: The heart size and mediastinal contours are within normal limits. Both lungs are clear. The visualized skeletal structures are unremarkable. IMPRESSION: No active disease. Electronically Signed   By: Lynwood Landy Raddle M.D.   On: 08/28/2024 13:39    Procedures   Medications Ordered in the ED  promethazine  (PHENERGAN ) 25 MG/ML injection (  Not Given 08/28/24 1454)  ondansetron  (ZOFRAN ) 4 MG/2ML injection (has no administration in time range)   HYDROmorphone  (DILAUDID ) injection 0.5 mg (0.5 mg Intravenous Given 08/28/24 1307)  promethazine  (PHENERGAN ) 6.25 mg/NS 50 mL IVPB (6.25 mg Intravenous New Bag/Given 08/28/24 1311)  sodium chloride  0.9 % bolus 500 mL (500 mLs Intravenous New Bag/Given 08/28/24 1313)  HYDROmorphone  (DILAUDID ) injection 1 mg (1 mg Intravenous Given 08/28/24 1504)  ondansetron  (ZOFRAN ) 8 mg in sodium chloride  0.9 % 50 mL IVPB (8 mg Intravenous New Bag/Given 08/28/24 1507)   58 y.o. female presents to the ED with complaints of abdominal pain, nausea, vomiting, headache, leg pain. this involves an extensive number of treatment options, and is a complaint that carries with it a high risk of complications and morbidity.  The differential diagnosis includes ACS, PE, gastritis, viral etiology, cyclic vomiting exacerbation, CKD complication.  On arrival pt is nontoxic, vitals show the patient is mildly tachycardic with no other remarkable vitals. Exam significant for diffuse abdominal pain to palpation without rigidity or masses noted throughout, nausea without vomiting, headache, back and bilateral thigh pain, mildly diaphoretic.  Patient has no chest pain or shortness of breath.  Patient has history of MI 15 years ago.  Pulses are good in distal extremities bilaterally and patient has no unilateral or bilateral swelling in her lower extremities.  No obvious deformities to hips or femur but patient reports severe pain to palpation to bilateral thighs.  Additional history obtained from chart review. Previous records obtained and reviewed shows to the ED admissions, multiple follow-ups for management of cyclic vomiting syndrome managed by family medicine with lorazepam  and oxycodone .  I ordered medication Phenergan  and Dilaudid  for nausea and pain.  Patient advised this helped but she still has pain in her left hip.  It is extremely tender to palpation and it was decided to get an x-ray of the hip to ensure  Lab Tests:  I Ordered,  reviewed, and interpreted labs, which included: Elevated creatinine which is at baseline for this patient patient with history of CKD.  Increased glucose but patient reports she did not take her insulin  today.  Hemoglobin at baseline for patient.  Lipase has been chronically elevated with  no evidence of pancreatitis on previous CT scans.  Imaging Studies ordered:  I ordered imaging studies which included chest and left hip x-ray, I independently visualized and interpreted imaging which showed no acute abnormalities  ED Course:   After initial medication management of symptoms patient reported feeling better but still had pain in her left thigh which was then decided to x-ray the thigh and the patient reported severe pain to light palpation.  She was reevaluated and no obvious injuries, dislocations, swelling and all pulses were equal bilaterally.  ACS is not suspected due to EKG and troponins.  Patient does not have symptoms related to pulmonary embolism had no shortness of breath.  No obvious signs of DVT.  It was discussed with attending and it was found all abnormal results are baseline for patient based off lab review.  It would be safe for discharge.  Patient was advised of the findings and advised to continue to take medications as prescribed at home and to follow-up with primary care.  Patient reported that she feels nauseous and does not feel comfortable going home.  She refused to trial p.o. intake at bedside.  After further discussion with with patient she reported that she would try p.o. intake if she had more nausea medication.  Prior to more nausea medication patient was able to eat some applesauce and crackers without vomiting.  Patient reports that she feeling a lot better and she is feeling comfortable going home after getting the nausea medication.  Patient was able to eat some more food and felt better after given more nausea medication and advised she was ready to be discharged.   Patient was advised to monitor for worsening symptoms and to follow-up with primary care and nephrologist.  Patient agreed with treatment plan and was comfortable with discharge.  Portions of this note were generated with Scientist, clinical (histocompatibility and immunogenetics). Dictation errors may occur despite best attempts at proofreading.    Final diagnoses:  Vomiting without nausea, unspecified vomiting type    ED Discharge Orders     None          Myriam Fonda GORMAN DEVONNA 08/28/24 2154    Doretha Folks, MD 08/30/24 2050

## 2024-08-28 NOTE — Discharge Instructions (Addendum)
 Continue to take medications at home as prescribed by primary care.  Do not drive tonight due to medication that we gave you in ED tonight.  Continue to monitor symptoms and return for worsening symptoms for further evaluation at the emergency room.  Follow-up with your primary care within the week for further evaluation.

## 2024-08-28 NOTE — ED Notes (Signed)
 Pt d/c instructions, medications, and follow-up care reviewed with pt. Pt verbalized understanding and had no further questions at time of d/c. Pt CA&Ox4, ambulatory, and in NAD at time of d/c. Pt reports she has someone picking her up to drive her home.

## 2024-08-28 NOTE — ED Notes (Signed)
 Pt visualized getting into passenger side of vehicle with family member to go home.

## 2024-08-28 NOTE — ED Triage Notes (Signed)
 Arrives POV with complaints of vomiting/nausea (hx of cyclic vomiting syndrome), pain in legs (bodyaches) x2 days.   Hx of CKD

## 2024-08-28 NOTE — Telephone Encounter (Signed)
 Patient was last seen in primary care on 08/09/2024 by McDiarmid, Krystal BIRCH, MD. - Advised pt tell ED to send records to PCP.  Called Nurse Triage reporting Abdominal Pain, Vomiting, Back Pain, Leg Pain, and Headache.  Symptoms began today.  Interventions attempted: Nothing.  Symptoms are: rapidly worsening.  Triage Disposition: Call EMS 911 Now  Patient/caregiver understands and will follow disposition?: No, refuses disposition      Copied from CRM #[id number]. Topic: Clinical - Red Word Triage >> Aug 28, 2024  8:30 AM Diannia H wrote: [facility] that prompted transfer to Nurse Triage: Patient is having really bad pain to her legs, back, stomach and head. She is throwing up. Pain level is above 10 on a 1-10  Per PAS, pt crying, stating never felt pain like that before.    Reason for Disposition  Sounds like a life-threatening emergency to the triager  Answer Assessment - Initial Assessment Questions 1. ONSET: When did the pain start?      Started this morning 2. LOCATION: Where is the pain located?      Hurting in legs, back, stomach, pain Leg pain 3. PAIN: How bad is the pain?    (Scale 1-10; or mild, moderate, severe)     Above 10/10, pt crying on phone 6. OTHER SYMPTOMS: Do you have any other symptoms? (e.g., chest pain, back pain, breathing difficulty, swelling, rash, fever, numbness, weakness)     Vomiting since yesterday, every time try to drink water  or take meds Not really chest pain or SOB but Exhausted from all the vomiting Denies swelling, fever  I'm miserable Can't go to hospitals here in [place] , daughter is gonna pick her up and take her to [facility] since know her hx    Advised 911 for pt, pt refusing, prefers ED at [facility], daughter on way to pick her up now. Advised pt call 911 for any worsening or new symptoms.  Protocols used: Leg Pain-A-AH

## 2024-08-28 NOTE — Telephone Encounter (Signed)
 Attempted call for surgery scheduling with Dr. Lanis.  Patient replied that she is on her way to the emergency room due to illness (vomiting since 9/1, abdominal pain, leg pain, headache).  This nurse will wait until patient is recovered to schedule.

## 2024-09-03 ENCOUNTER — Other Ambulatory Visit: Payer: Self-pay

## 2024-09-03 DIAGNOSIS — N185 Chronic kidney disease, stage 5: Secondary | ICD-10-CM

## 2024-09-03 DIAGNOSIS — R1115 Cyclical vomiting syndrome unrelated to migraine: Secondary | ICD-10-CM

## 2024-09-03 MED ORDER — OXYCODONE HCL 5 MG PO TABS: 5.0000 mg | ORAL_TABLET | ORAL | 0 refills | Status: DC | PRN

## 2024-09-06 ENCOUNTER — Telehealth: Payer: Self-pay

## 2024-09-06 ENCOUNTER — Ambulatory Visit: Admitting: Family Medicine

## 2024-09-06 VITALS — BP 160/65 | HR 85 | Ht 61.0 in | Wt 151.1 lb

## 2024-09-06 DIAGNOSIS — E1159 Type 2 diabetes mellitus with other circulatory complications: Secondary | ICD-10-CM

## 2024-09-06 DIAGNOSIS — Z794 Long term (current) use of insulin: Secondary | ICD-10-CM

## 2024-09-06 DIAGNOSIS — F325 Major depressive disorder, single episode, in full remission: Secondary | ICD-10-CM

## 2024-09-06 DIAGNOSIS — R1115 Cyclical vomiting syndrome unrelated to migraine: Secondary | ICD-10-CM | POA: Diagnosis not present

## 2024-09-06 DIAGNOSIS — E1122 Type 2 diabetes mellitus with diabetic chronic kidney disease: Secondary | ICD-10-CM

## 2024-09-06 DIAGNOSIS — N184 Chronic kidney disease, stage 4 (severe): Secondary | ICD-10-CM

## 2024-09-06 DIAGNOSIS — I152 Hypertension secondary to endocrine disorders: Secondary | ICD-10-CM

## 2024-09-06 DIAGNOSIS — Z0000 Encounter for general adult medical examination without abnormal findings: Secondary | ICD-10-CM

## 2024-09-06 DIAGNOSIS — Z23 Encounter for immunization: Secondary | ICD-10-CM | POA: Diagnosis not present

## 2024-09-06 MED ORDER — LORAZEPAM 1 MG PO TABS: 1.0000 mg | ORAL_TABLET | Freq: Three times a day (TID) | ORAL | 5 refills | Status: DC | PRN

## 2024-09-06 MED ORDER — OXYCODONE HCL 5 MG PO TABS
5.0000 mg | ORAL_TABLET | ORAL | 0 refills | Status: DC | PRN
Start: 2024-09-06 — End: 2024-09-26

## 2024-09-06 MED ORDER — OXYCODONE HCL 5 MG PO TABS: 5.0000 mg | ORAL_TABLET | ORAL | 0 refills | Status: DC | PRN

## 2024-09-06 NOTE — Telephone Encounter (Signed)
 Received call from pharmacy regarding oxycodone  prescription.   They received rx from Dr. McDiarmid, however, start date is not until October 8th.   Pharmacist reports that they have not received an oxycodone  prescription for September.   Will forward to PCP for further advisement. Pharmacist advised that if patient is supposed to receive rx for September, she will need a new prescription with September start date.   Thanks.   [name], RN

## 2024-09-06 NOTE — Addendum Note (Signed)
 Addended by: Kerrington Greenhalgh C on: 09/06/2024 02:33 PM   Modules accepted: Orders

## 2024-09-06 NOTE — Patient Instructions (Addendum)
 Dr Cory Kitt send in a referral to the [facility] folks that help patients navigate the complex healthcare system.    Refills of your oxycodone  and Lorazepam  were sent in.  The delay is Dr Topanga Alvelo's fault, sorry.   Dr Kentavious Michele will look into something to use instead of Seroquel  that will not cause you to feel so dry.     Dr Deeann Servidio recommends taking the Sodium Bicarbonate.  It will help with keeping your bones strong and help keep your potassium in a good range.   Remember our discussion about taking the Lokelma .

## 2024-09-06 NOTE — Telephone Encounter (Signed)
 Spoke with Pharm Rep. Evalyn about patient Oxycodone  Rx that was sent to day at 2:45pm. About having today's date on it and not October. She stated that as of yet they had not received the Rx, but she or one of her colleagues would call be back if they didn't get the Rx with today's date. I gave her my desk number. I will follow up tomorrow. Nelson Land, CMA

## 2024-09-06 NOTE — Progress Notes (Unsigned)
 [name] [name] is {Pc accompanied by:5710} Sources of clinical information for visit is/are {Information source:60032}. Nursing assessment for this office visit was reviewed with the patient for accuracy and revision.   Previous Report(s) Reviewed: {Outside review:15817}     07/09/2024   10:43 AM  Depression screen PHQ 2/9  Decreased Interest 0  Down, Depressed, Hopeless 0  PHQ - 2 Score 0  Altered sleeping 0  Tired, decreased energy 0  Change in appetite 0  Feeling bad or failure about yourself  0  Trouble concentrating 0  Moving slowly or fidgety/restless 0  Suicidal thoughts 0  PHQ-9 Score 0  Difficult doing work/chores Not difficult at all   Flowsheet Row Clinical Support from 07/09/2024 in [facility] [place]. [facility] Office Visit from 06/26/2024 in [facility] Of [place]. [facility] Office Visit from 03/30/2024 in [facility]  Thoughts that you would be better off dead, or of hurting yourself in some way Not at all Not at all Not at all  PHQ-9 Total Score 0 0 3       07/09/2024   10:40 AM 04/16/2024   11:15 AM 06/14/2023    3:15 PM 09/16/2021    3:01 PM 06/05/2020   10:31 AM  Fall Risk   Falls in the past year? 1 0 0 0 1  Number falls in past yr: 0  0 0 0  Injury with Fall? 1  0 0 1  Comment LEFT SIDE      Follow up Falls evaluation completed;Education provided  Falls evaluation completed;Education provided;Falls prevention discussed         07/09/2024   10:43 AM 06/26/2024   10:14 AM 03/30/2024    1:11 PM  PHQ9 SCORE ONLY  PHQ-9 Total Score 0  0  3      Data saved with a previous flowsheet row definition    There are no preventive care reminders to display for this patient.  Health Maintenance Due  Topic Date Due   Zoster Vaccines- Shingrix (1 of 2) Never done   DTaP/Tdap/Td (4 - Td or Tdap) 09/29/2023   Lung Cancer Screening  09/08/2024       History/P.E. limitations: {exam; limitations ed:60112}  There are no preventive care reminders to display for this patient.  Diabetes Health Maintenance Due  Topic Date Due   HEMOGLOBIN A1C  12/27/2024   OPHTHALMOLOGY EXAM  04/03/2025    Health Maintenance Due  Topic Date Due   Zoster Vaccines- Shingrix (1 of 2) Never done   DTaP/Tdap/Td (4 - Td or Tdap) 09/29/2023   Lung Cancer Screening  09/08/2024     No chief complaint on file.    Discussed the use of AI scribe software for clinical note transcription with the patient, who gave verbal consent to proceed.  History of Present Illness      SDOH Screenings   Food Insecurity: No Food Insecurity (07/09/2024)  Housing: Low Risk  (07/09/2024)  Transportation Needs: No Transportation Needs (07/09/2024)  Utilities: Not At Risk (07/09/2024)  Alcohol Screen: Low Risk  (07/09/2024)  Depression (PHQ2-9): Low Risk  (07/09/2024)  Financial Resource Strain: Low Risk  (07/09/2024)  Physical Activity: Inactive (07/09/2024)  Social Connections: Socially Isolated (07/09/2024)  Stress: No Stress Concern Present (07/09/2024)  Tobacco Use: High Risk (08/28/2024)   --------------------------------------------------------------------------------------------------------------------------------------------- Visit Problem List with Assessment and Plan   Assessment and  Plan Assessment & Plan      No problem-specific Assessment & Plan notes found for this encounter.

## 2024-09-07 ENCOUNTER — Encounter: Payer: Self-pay | Admitting: Family Medicine

## 2024-09-07 NOTE — Assessment & Plan Note (Addendum)
 Established problem. Adequate blood pressure control.  No evidence of new end organ damage.  Tolerating medication without significant adverse effects.  Plan to continue current blood pressure medication regiment.   Dr Marlee prescribed Lokelma  5 mg daily for patient's recurrent hyperkalemia

## 2024-09-07 NOTE — Assessment & Plan Note (Signed)
 [name] [name] is feeling stressed in keeping up with the demands of keeping up with her complex medical needs.  Referral placed to [facility] for complex case management assistance.

## 2024-09-07 NOTE — Assessment & Plan Note (Signed)
Established problem. Adequate glycemic control.  Pt is tolerating the current medication regiment. Continue current treatment plan.

## 2024-09-07 NOTE — Assessment & Plan Note (Signed)
 Established problem Exacerbation requ ED visit 08/28/24 treated sucdcessfully with IVF, dilaudid  and phenergan . Did not terminate with home cocktail ativan /oxycodone /phenergan  PDMP review without red flags. Refill of oxycodone  today #30 tab.

## 2024-09-07 NOTE — Assessment & Plan Note (Signed)
 Ms Worthley request holding off on LD CT Lung until the issues around her AV shunt placement resolved.

## 2024-09-09 ENCOUNTER — Other Ambulatory Visit: Payer: Self-pay | Admitting: Family Medicine

## 2024-09-10 ENCOUNTER — Inpatient Hospital Stay: Admission: RE | Admit: 2024-09-10 | Source: Ambulatory Visit

## 2024-09-12 ENCOUNTER — Other Ambulatory Visit: Payer: Self-pay | Admitting: *Deleted

## 2024-09-12 NOTE — Telephone Encounter (Signed)
 Reviewed.  [name] [name] has her oxycodone  prescriptions for September and October already sent to her pharmacy.

## 2024-09-12 NOTE — Patient Instructions (Signed)
 [name] [name] - I am sorry I was unable to reach you today.  I received a referral for you and was calling to speak with you. I work with McDiarmid, Krystal BIRCH, MD and am calling to support your healthcare needs. Please contact me at [phone]. I look forward to speaking with you soon.   Thank you,   Jonni Oelkers, RN, BSN, ACM RN Care Manager [employer] [phone]

## 2024-09-14 ENCOUNTER — Telehealth: Admitting: *Deleted

## 2024-09-14 ENCOUNTER — Other Ambulatory Visit: Payer: Self-pay | Admitting: *Deleted

## 2024-09-14 DIAGNOSIS — N189 Chronic kidney disease, unspecified: Secondary | ICD-10-CM
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
## 2024-09-14 NOTE — Patient Instructions (Signed)
 Visit Information  Thank you for taking time to visit with me today. Please don't hesitate to contact me if I can be of assistance to you before our next scheduled appointment.  Your next care management appointment is by telephone on 09/17/24 at  9am  Please call the care guide team at [phone] if you need to cancel, schedule, or reschedule an appointment.   Please call the Suicide and Crisis Lifeline: 988 call the [place]  National Suicide Prevention Lifeline: 419-064-2055 or TTY: 979-148-1706 TTY 769-024-9771) to talk to a trained counselor call 1-800-273-TALK (toll free, 24 hour hotline) call the [facility]: (930) [phone] if you are experiencing a Mental Health or Behavioral Health Crisis or need someone to talk to.  Janisa Labus, RN, BSN, Theatre manager [employer] [phone]

## 2024-09-19 ENCOUNTER — Inpatient Hospital Stay: Admission: RE | Admit: 2024-09-19 | Source: Ambulatory Visit

## 2024-09-19 ENCOUNTER — Telehealth: Payer: Self-pay

## 2024-09-19 NOTE — Telephone Encounter (Signed)
 Received call from [employer] case Production designer, theatre/television/film regarding patient's nutritional supplement.   [employer] no longer carries Nepro.   Case manager is faxing over form to start process for receiving supplement from another agency.   This will require signature from PCP.   FYI.   [name], RN

## 2024-09-20 ENCOUNTER — Telehealth: Admitting: *Deleted

## 2024-09-20 ENCOUNTER — Other Ambulatory Visit: Admitting: *Deleted

## 2024-09-20 ENCOUNTER — Other Ambulatory Visit: Payer: Self-pay

## 2024-09-20 NOTE — Patient Instructions (Signed)
 Visit Information  Thank you for taking time to visit with me today. Please don't hesitate to contact me if I can be of assistance to you before our next scheduled appointment.  Your next care management appointment is by telephone on 10/05/24 at 9:30 am   Please call the care guide team at [phone] if you need to cancel, schedule, or reschedule an appointment.   Please call the Suicide and Crisis Lifeline: 988 call the [place]  National Suicide Prevention Lifeline: 443-532-7758 or TTY: (830) 211-1007 TTY 301 137 8392) to talk to a trained counselor call 1-800-273-TALK (toll free, 24 hour hotline) go to [facility] [address], [place] [phone]) call 911 if you are experiencing a Mental Health or Behavioral Health Crisis or need someone to talk to.  Jonny Dearden, RN, BSN, Theatre manager [employer] [phone]
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
## 2024-09-24 ENCOUNTER — Telehealth: Payer: Self-pay | Admitting: Pharmacist

## 2024-09-24 LAB — LAB REPORT - SCANNED
Calcium: 9.6
EGFR: 10
PTH: 176

## 2024-09-24 NOTE — Progress Notes (Signed)
 Reviewed and agree with Dr Rennis plan.  I appreciate Dr Rennis work on [employer].

## 2024-09-24 NOTE — Telephone Encounter (Signed)
 Reviewed and agree with Dr Rennis plan.

## 2024-09-24 NOTE — Telephone Encounter (Signed)
 Patient contacted for follow-up of medication regimen review  Since last contact patient reports confusion with medication regimen. Patient asked to bring in all medications to follow-up visit to assist.   Total time with patient call and documentation of interaction: 4 minutes.  Follow-up appointment scheduled: @9 :30 am on 09/27/24

## 2024-09-25 ENCOUNTER — Other Ambulatory Visit: Payer: Self-pay | Admitting: Family Medicine

## 2024-09-25 DIAGNOSIS — R1115 Cyclical vomiting syndrome unrelated to migraine: Secondary | ICD-10-CM

## 2024-09-25 DIAGNOSIS — E1143 Type 2 diabetes mellitus with diabetic autonomic (poly)neuropathy: Secondary | ICD-10-CM

## 2024-09-25 NOTE — Telephone Encounter (Signed)
 [employer] pathway plus form completed at fax'd along with last office visit note, med list and last labs to [employer] at [employer] [phone]

## 2024-09-26 ENCOUNTER — Telehealth: Payer: Self-pay | Admitting: Pharmacist

## 2024-09-26 NOTE — Telephone Encounter (Signed)
 Patient contacted for CGM logistics discussion prior to planned office visit 10/2  Patient reports she has phone that downloads APPS.  She was asked to download Libre3 APP prior to office visit.   Plans for appointment tomorrow.   Total time with patient call and documentation of interaction: 7 minutes.

## 2024-09-27 ENCOUNTER — Ambulatory Visit (INDEPENDENT_AMBULATORY_CARE_PROVIDER_SITE_OTHER): Admitting: Pharmacist

## 2024-09-27 ENCOUNTER — Encounter: Payer: Self-pay | Admitting: Pharmacist

## 2024-09-27 ENCOUNTER — Encounter (HOSPITAL_COMMUNITY): Payer: Self-pay | Admitting: Vascular Surgery

## 2024-09-27 VITALS — BP 177/77 | HR 83 | Wt 150.8 lb

## 2024-09-27 DIAGNOSIS — E1122 Type 2 diabetes mellitus with diabetic chronic kidney disease: Secondary | ICD-10-CM | POA: Diagnosis not present

## 2024-09-27 DIAGNOSIS — N184 Chronic kidney disease, stage 4 (severe): Secondary | ICD-10-CM | POA: Diagnosis not present

## 2024-09-27 DIAGNOSIS — Z794 Long term (current) use of insulin: Secondary | ICD-10-CM | POA: Diagnosis not present

## 2024-09-27 NOTE — Assessment & Plan Note (Signed)
 Diabetes longstanding currently appears well controlled based on A1C in 06/2024 however, patient is NOT check home glucose levels. Patient is willing to initiate use of CGM to improve tracking and hopefully glucose control.  Instructed and initiated CGM with Libre 3+ - Continued basal insulin  Lantus  (insulin  glargine) at 12-16 units per patient determined dosing.   -Patient educated on purpose, proper use, and potential adverse effects of hypoglycemia.  -Extensively discussed pathophysiology of diabetes, recommended lifestyle interventions, dietary effects on blood sugar control.  *Note - no CGM sensors prescription sent today due to patient has adhesive allergy and determination of tolerability with use of sensor seems appropriate use of sample to determine tolerability.

## 2024-09-27 NOTE — Progress Notes (Addendum)
 PCP - McDiarmid, Krystal BIRCH, MD  Cardiologist - Court Dorn PARAS, MD (LOV 01-09-24)   PPM/ICD - denies Device Orders - n/a Rep Notified - n/a  Chest x-ray - 08-28-24 EKG - 08-28-24 Stress Test - denies ECHO - 05-19-16 Cardiac Cath - 07-2007  CPAP - denies  GLP-1 -denies  Fasting Blood Sugar - Per patient blood sugar ranges 80-120 Patient has [employer] as of 09-27-24. Last A1c on 06-26-24 6.1  Blood Thinner Instructions: denies Aspirin  Instructions: denies  ERAS Protcol - NPO  COVID TEST- n/a  Anesthesia review: yes, hx of DM, HTN, MI  Patient verbally denies any shortness of breath, fever, cough and chest pain during phone call   -------------  SDW INSTRUCTIONS given:  Your procedure is scheduled on October 01, 2024.  Report to [facility] Main Entrance A at 5:30 A.M., and check in at the Admitting office.  Call this number if you have problems the morning of surgery:  [phone]   Remember:  Do not eat or drink after midnight the night before your surgery     Take these medicines the morning of surgery with A SIP OF WATER   diltiazem  (CARDIZEM  CD)  fluticasone  (FLONASE ) 50 MCG/ACT nasal spray  hydrALAZINE  (APRESOLINE )  linaclotide  (LINZESS )  LORazepam  (ATIVAN )   As of today, STOP taking any Aspirin  (unless otherwise instructed by your surgeon) Aleve, Naproxen, Ibuprofen , Motrin , Advil , Goody's, BC's, all herbal medications, fish oil, and all vitamins. WHAT DO I DO ABOUT MY DIABETES MEDICATION?   Do not take oral diabetes medicines (pills) the morning of surgery.  THE NIGHT BEFORE SURGERY, take 5-6 units of insulin  glargine insulin .  (Half of normal dose)     The day of surgery, do not take other diabetes injectables, including Byetta (exenatide), Bydureon (exenatide ER), Victoza (liraglutide), or Trulicity (dulaglutide).  If your CBG is greater than 220 mg/dL, you may take  of your sliding scale (correction) dose of insulin .   HOW TO MANAGE YOUR  DIABETES BEFORE AND AFTER SURGERY  Why is it important to control my blood sugar before and after surgery? Improving blood sugar levels before and after surgery helps healing and can limit problems. A way of improving blood sugar control is eating a healthy diet by:  Eating less sugar and carbohydrates  Increasing activity/exercise  Talking with your doctor about reaching your blood sugar goals High blood sugars (greater than 180 mg/dL) can raise your risk of infections and slow your recovery, so you will need to focus on controlling your diabetes during the weeks before surgery. Make sure that the doctor who takes care of your diabetes knows about your planned surgery including the date and location.  How do I manage my blood sugar before surgery? Check your blood sugar at least 4 times a day, starting 2 days before surgery, to make sure that the level is not too high or low.  Check your blood sugar the morning of your surgery when you wake up and every 2 hours until you get to the Short Stay unit.  If your blood sugar is less than 70 mg/dL, you will need to treat for low blood sugar: Do not take insulin . Treat a low blood sugar (less than 70 mg/dL) with  cup of clear juice (cranberry or apple), 4 glucose tablets, OR glucose gel. Recheck blood sugar in 15 minutes after treatment (to make sure it is greater than 70 mg/dL). If your blood sugar is not greater than 70 mg/dL on recheck, call [phone]  for further instructions. Report your blood sugar to the short stay nurse when you get to Short Stay.  If you are admitted to the hospital after surgery: Your blood sugar will be checked by the staff and you will probably be given insulin  after surgery (instead of oral diabetes medicines) to make sure you have good blood sugar levels. The goal for blood sugar control after surgery is 80-180 mg/dL.                      Do not wear jewelry, make up, or nail polish            Do not wear  lotions, powders, perfumes/colognes, or deodorant.            Do not shave 48 hours prior to surgery.  Men may shave face and neck.            Do not bring valuables to the hospital.            [facility] is not responsible for any belongings or valuables.  Do NOT Smoke (Tobacco/Vaping) 24 hours prior to your procedure If you use a CPAP at night, you may bring all equipment for your overnight stay.   Contacts, glasses, dentures or bridgework may not be worn into surgery.      For patients admitted to the hospital, discharge time will be determined by your treatment team.   Patients discharged the day of surgery will not be allowed to drive home, and someone needs to stay with them for 24 hours.    Special instructions:   [place]- Preparing For Surgery  Before surgery, you can play an important role. Because skin is not sterile, your skin needs to be as free of germs as possible. You can reduce the number of germs on your skin by washing with CHG (chlorahexidine gluconate) Soap before surgery.  CHG is an antiseptic cleaner which kills germs and bonds with the skin to continue killing germs even after washing.    Oral Hygiene is also important to reduce your risk of infection.  Remember - BRUSH YOUR TEETH THE MORNING OF SURGERY WITH YOUR REGULAR TOOTHPASTE  Please do not use if you have an allergy to CHG or antibacterial soaps. If your skin becomes reddened/irritated stop using the CHG.  Do not shave (including legs and underarms) for at least 48 hours prior to first CHG shower. It is OK to shave your face.  Please follow these instructions carefully.   Shower the NIGHT BEFORE SURGERY and the MORNING OF SURGERY with DIAL  Soap.   Pat yourself dry with a CLEAN TOWEL.  Wear CLEAN PAJAMAS to bed the night before surgery  Place CLEAN SHEETS on your bed the night of your first shower and DO NOT SLEEP WITH PETS.   Day of Surgery: Please shower morning of surgery  Wear  Clean/Comfortable clothing the morning of surgery Do not apply any deodorants/lotions.   Remember to brush your teeth WITH YOUR REGULAR TOOTHPASTE.   Questions were answered. Patient verbalized understanding of instructions.

## 2024-09-27 NOTE — Progress Notes (Signed)
    S:     Chief Complaint  Patient presents with   Medication Management    Diabetes - Libre 3+ set-up   58 y.o. female who presents for diabetes evaluation, education, and management. Today, patient arrives in fair-good spirits and presents without any assistance. Patient is saddened by the plan to progress toward dialysis.   Patient was referred and last seen by Primary Care Provider, Dr. McDiarmid, on 09/06/2024.  At last visit, medication review and initiation of CGM were discussed as plan for today.  Majority of visit today was focused on initiation of Libre 3+ CGM.   Current diabetes medications include: Lantus  (insulin  glargine) 12-16 units per day, dosed based on how she feels - not checking glucose with glucometer.  Current hypertension medications include: Diltiazem  and Hydralazine   Patient reports adherence to taking all medications as prescribed.  Patient was clear and confident of current regimen and uses.   Do you feel that your medications are working for you? yes Have you been experiencing any side effects to the medications prescribed? no Do you have any problems obtaining medications due to transportation or finances? no Insurance coverage: [employer]  Patient denies hypoglycemic events.  Patient-reported exercise habits: limited  O:  Review of Systems  All other systems reviewed and are negative.   Physical Exam Vitals reviewed.  Constitutional:      Appearance: Normal appearance.  Pulmonary:     Effort: Pulmonary effort is normal.  Neurological:     Mental Status: She is alert.  Psychiatric:        Mood and Affect: Mood normal.        Thought Content: Thought content normal.    Lab Results  Component Value Date   HGBA1C 6.1 06/26/2024   Vitals:   09/27/24 0950  BP: (!) 172/79  Pulse: 83  SpO2: 100%    Lipid Panel     Component Value Date/Time   CHOL 117 03/04/2023 1304   TRIG 49 03/04/2023 1304   HDL 69 03/04/2023 1304   CHOLHDL 1.7  03/04/2023 1304   CHOLHDL 3.9 08/03/2016 1322   VLDL 29 08/03/2016 1322   LDLCALC 36 03/04/2023 1304   LDLDIRECT 102 (H) 02/14/2012 1137    A/P: Diabetes longstanding currently appears well controlled based on A1C in 06/2024 however, patient is NOT check home glucose levels. Patient is willing to initiate use of CGM to improve tracking and hopefully glucose control.  Instructed and initiated CGM with Libre 3+ - Continued basal insulin  Lantus  (insulin  glargine) at 12-16 units per patient determined dosing.   -Patient educated on purpose, proper use, and potential adverse effects of hypoglycemia.  -Extensively discussed pathophysiology of diabetes, recommended lifestyle interventions, dietary effects on blood sugar control.  *Note - no CGM sensors prescription sent today due to patient has adhesive allergy and determination of tolerability with use of sensor seems appropriate use of sample to determine tolerability.    Written patient instructions provided. Patient verbalized understanding of treatment plan.  Total time in face to face counseling 37 minutes.    Follow-up:  Pharmacist 2 weeks for CGM review and potential medication adjustment.  PCP clinic visit in TBD ~ 1 month.  Patient seen with Lawson Mao, PharmD Candidate - PY3 student.

## 2024-09-27 NOTE — Patient Instructions (Addendum)
 It was nice to see you today!  Enjoy your CGM.  Your Password is:  [email or web address]   Your goal blood sugar is 80-130 before eating and less than 180 after eating.  Medication Changes: Continue all other medication the same.     Keep up the good work with diet and exercise. Aim for a diet full of vegetables, fruit and lean meats (chicken, [place], fish). Try to limit salt intake by eating fresh or frozen vegetables (instead of canned), rinse canned vegetables prior to cooking and do not add any additional salt to meals.     Sensor Application If using the App, you can tap Help in the Main Menu to access an in-app tutorial on applying a Sensor. See below for instructions on how to download the app. Apply Sensors only on the back of your upper arm. If placed in other areas, the Sensor may not function properly and could give you inaccurate readings. Avoid areas with scars, moles, stretch marks, or lumps.   Select an area of skin that generally stays flat during your normal daily activities (no bending or folding). Choose a site that is at least 1 inch (2.5 cm) away from any injection sites. To prevent discomfort or skin irritation, you should select a different site other than the one most recently used. Wash application site using a plain soap, dry, and then clean with an alcohol wipe. This will help remove any oily residue that may prevent the sensor from sticking properly. Allow site to air dry before proceeding. Note: The area MUST be clean and dry, or the Sensor may not stay on for the full wear duration specified by your Sensor insert. 4. Unscrew the cap from the Sensor Applicator and set the cap aside.  5. Place the Sensor Applicator over the prepared site and push down firmly to apply the Sensor to your body. 6. Gently pull the Sensor Applicator away from your body. The Sensor should now be attached to your skin. 7. Make sure the Sensor is secure after application. Put the cap back on  the Sensor Applicator. Discard the used Engineer, agricultural according to local regulations.  What If My Sensor Falls Off or What If My Sensor Isn't Working? Call [employer] Customer Care Team at [phone] Available 7 days a week from 8AM-8PM EST, excluding holidays If you have multiple sensors fall, contact [facility] at [phone]   The App Download the [employer] [place] 3 App in your phone's app store   Load the app and select get started now Create an account  Tap scan new sensor Follow the prompts on the screen. If your sensor does not sync, try moving your phone slowly around the sensor. Phone cases may affect scanning. This will be the only time you have to scan the sensor until you apply a new sensor.   There will be a 60 minute start up period until the app will display your glucose reading

## 2024-09-28 NOTE — Anesthesia Preprocedure Evaluation (Addendum)
 Anesthesia Evaluation  Patient identified by MRN, date of birth, ID band Patient awake    Reviewed: Allergy & Precautions, NPO status , Patient's Chart, lab work & pertinent test results, reviewed documented beta blocker date and time   History of Anesthesia Complications (+) PONV and history of anesthetic complications  Airway Mallampati: II  TM Distance: >3 FB     Dental  (+) Edentulous Upper   Pulmonary neg shortness of breath, pneumonia, resolved, COPD,  COPD inhaler, Current Smoker and Patient abstained from smoking.   breath sounds clear to auscultation       Cardiovascular hypertension, + CAD and + Past MI  (-) Cardiac Stents and (-) CABG + Valvular Problems/Murmurs  Rhythm:Regular Rate:Normal  Study Conclusions   - Left ventricle: The cavity size was normal. There was mild    concentric hypertrophy. Systolic function was normal. Wall motion    was normal; there were no regional wall motion abnormalities.    Features are consistent with a pseudonormal left ventricular    filling pattern, with concomitant abnormal relaxation and    increased filling pressure (grade 2 diastolic dysfunction).    Doppler parameters are consistent with high ventricular filling    pressure.  - Aortic valve: Transvalvular velocity was within the normal range.    There was no stenosis. There was no regurgitation.  - Mitral valve: Transvalvular velocity was within the normal range.    There was no evidence for stenosis. There was trivial    regurgitation.  - Left atrium: The atrium was moderately dilated.  - Right ventricle: The cavity size was normal. Wall thickness was    normal. Systolic function was normal.  - Atrial septum: No defect or patent foramen ovale was identified    by color flow Doppler.  - Tricuspid valve: There was trivial regurgitation.  - Pulmonary arteries: Systolic pressure was within the normal    range. PA peak pressure:  24 mm Hg (S).      Neuro/Psych  Headaches, neg Seizures PSYCHIATRIC DISORDERS Anxiety Depression     Neuromuscular disease    GI/Hepatic PUD,GERD  Medicated and Controlled,,  Endo/Other  diabetes, Type 2    Renal/GU CRF and ESRFRenal disease     Musculoskeletal  (+) Arthritis , Osteoarthritis,    Abdominal   Peds  Hematology  (+) Blood dyscrasia, anemia   Anesthesia Other Findings   Reproductive/Obstetrics                              Anesthesia Physical Anesthesia Plan  ASA: 3  Anesthesia Plan: MAC and Regional   Post-op Pain Management: Regional block*   Induction: Intravenous  PONV Risk Score and Plan: 2 and Ondansetron  and Propofol  infusion  Airway Management Planned: Natural Airway and Simple Face Mask  Additional Equipment:   Intra-op Plan:   Post-operative Plan:   Informed Consent: I have reviewed the patients History and Physical, chart, labs and discussed the procedure including the risks, benefits and alternatives for the proposed anesthesia with the patient or authorized representative who has indicated his/her understanding and acceptance.     Dental advisory given  Plan Discussed with: CRNA  Anesthesia Plan Comments: (See previous anesthesia APP note written by Allison Zelenak, PA-C for Date of Service 07/13/2024.  )         Anesthesia Quick Evaluation

## 2024-10-01 ENCOUNTER — Other Ambulatory Visit: Payer: Self-pay

## 2024-10-01 ENCOUNTER — Ambulatory Visit (HOSPITAL_COMMUNITY): Payer: Self-pay | Admitting: Vascular Surgery

## 2024-10-01 ENCOUNTER — Telehealth: Payer: Self-pay

## 2024-10-01 ENCOUNTER — Ambulatory Visit (HOSPITAL_COMMUNITY)
Admission: RE | Admit: 2024-10-01 | Discharge: 2024-10-01 | Disposition: A | Discharge status: Home / Self Care | Attending: Vascular Surgery | Admitting: Vascular Surgery

## 2024-10-01 ENCOUNTER — Encounter (HOSPITAL_COMMUNITY): Payer: Self-pay | Admitting: Vascular Surgery

## 2024-10-01 ENCOUNTER — Encounter (HOSPITAL_COMMUNITY)
Admission: RE | Disposition: A | Discharge status: Home / Self Care | Payer: Self-pay | Source: Home / Self Care | Attending: Vascular Surgery

## 2024-10-01 DIAGNOSIS — I2510 Atherosclerotic heart disease of native coronary artery without angina pectoris: Secondary | ICD-10-CM | POA: Diagnosis not present

## 2024-10-01 DIAGNOSIS — F419 Anxiety disorder, unspecified: Secondary | ICD-10-CM | POA: Diagnosis not present

## 2024-10-01 DIAGNOSIS — N186 End stage renal disease: Secondary | ICD-10-CM

## 2024-10-01 DIAGNOSIS — I120 Hypertensive chronic kidney disease with stage 5 chronic kidney disease or end stage renal disease: Secondary | ICD-10-CM

## 2024-10-01 DIAGNOSIS — K219 Gastro-esophageal reflux disease without esophagitis: Secondary | ICD-10-CM | POA: Insufficient documentation

## 2024-10-01 DIAGNOSIS — Z992 Dependence on renal dialysis: Secondary | ICD-10-CM | POA: Diagnosis not present

## 2024-10-01 DIAGNOSIS — F17210 Nicotine dependence, cigarettes, uncomplicated: Secondary | ICD-10-CM

## 2024-10-01 DIAGNOSIS — E1143 Type 2 diabetes mellitus with diabetic autonomic (poly)neuropathy: Secondary | ICD-10-CM | POA: Diagnosis not present

## 2024-10-01 DIAGNOSIS — I1311 Hypertensive heart and chronic kidney disease without heart failure, with stage 5 chronic kidney disease, or end stage renal disease: Secondary | ICD-10-CM | POA: Diagnosis not present

## 2024-10-01 DIAGNOSIS — Z794 Long term (current) use of insulin: Secondary | ICD-10-CM | POA: Insufficient documentation

## 2024-10-01 DIAGNOSIS — J449 Chronic obstructive pulmonary disease, unspecified: Secondary | ICD-10-CM | POA: Diagnosis not present

## 2024-10-01 DIAGNOSIS — I252 Old myocardial infarction: Secondary | ICD-10-CM | POA: Insufficient documentation

## 2024-10-01 DIAGNOSIS — F32A Depression, unspecified: Secondary | ICD-10-CM | POA: Insufficient documentation

## 2024-10-01 DIAGNOSIS — E1122 Type 2 diabetes mellitus with diabetic chronic kidney disease: Secondary | ICD-10-CM | POA: Insufficient documentation

## 2024-10-01 DIAGNOSIS — E11319 Type 2 diabetes mellitus with unspecified diabetic retinopathy without macular edema: Secondary | ICD-10-CM | POA: Insufficient documentation

## 2024-10-01 DIAGNOSIS — N185 Chronic kidney disease, stage 5: Secondary | ICD-10-CM

## 2024-10-01 HISTORY — DX: Nausea with vomiting, unspecified: R11.2

## 2024-10-01 HISTORY — PX: INSERTION OF ARTERIOVENOUS (AV) ARTEGRAFT ARM: SHX6779

## 2024-10-01 HISTORY — DX: Chronic obstructive pulmonary disease, unspecified: J44.9

## 2024-10-01 LAB — POCT I-STAT, CHEM 8
BUN: 50 mg/dL — ABNORMAL HIGH (ref 6–20)
Calcium, Ion: 1.14 mmol/L — ABNORMAL LOW (ref 1.15–1.40)
Chloride: 112 mmol/L — ABNORMAL HIGH (ref 98–111)
Creatinine, Ser: 4.7 mg/dL — ABNORMAL HIGH (ref 0.44–1.00)
Glucose, Bld: 145 mg/dL — ABNORMAL HIGH (ref 70–99)
HCT: 32 % — ABNORMAL LOW (ref 36.0–46.0)
Hemoglobin: 10.9 g/dL — ABNORMAL LOW (ref 12.0–15.0)
Potassium: 4.4 mmol/L (ref 3.5–5.1)
Sodium: 143 mmol/L (ref 135–145)
TCO2: 18 mmol/L — ABNORMAL LOW (ref 22–32)

## 2024-10-01 LAB — CBC
HCT: 28.8 % — ABNORMAL LOW (ref 36.0–46.0)
Hemoglobin: 9.3 g/dL — ABNORMAL LOW (ref 12.0–15.0)
MCH: 30.8 pg (ref 26.0–34.0)
MCHC: 32.3 g/dL (ref 30.0–36.0)
MCV: 95.4 fL (ref 80.0–100.0)
Platelets: 322 K/uL (ref 150–400)
RBC: 3.02 MIL/uL — ABNORMAL LOW (ref 3.87–5.11)
RDW: 16.4 % — ABNORMAL HIGH (ref 11.5–15.5)
WBC: 6.7 K/uL (ref 4.0–10.5)
nRBC: 0 % (ref 0.0–0.2)

## 2024-10-01 LAB — GLUCOSE, CAPILLARY
Glucose-Capillary: 156 mg/dL — ABNORMAL HIGH (ref 70–99)
Glucose-Capillary: 152 mg/dL — ABNORMAL HIGH (ref 70–99)
Glucose-Capillary: 205 mg/dL — ABNORMAL HIGH (ref 70–99)

## 2024-10-01 SURGERY — INSERTION, GRAFT, ARTERIOVENOUS, UPPER EXTREMITY
Anesthesia: General | Site: Arm Upper | Laterality: Right

## 2024-10-01 MED ORDER — ROCURONIUM BROMIDE 10 MG/ML (PF) SYRINGE
PREFILLED_SYRINGE | INTRAVENOUS | Status: DC | PRN
  Administered 2024-10-01: 60 mg via INTRAVENOUS

## 2024-10-01 MED ORDER — HEMOSTATIC AGENTS (NO CHARGE) OPTIME
TOPICAL | Status: DC | PRN
  Administered 2024-10-01 (×2): 1 via TOPICAL

## 2024-10-01 MED ORDER — PROPOFOL 10 MG/ML IV BOLUS
INTRAVENOUS | Status: AC
  Filled 2024-10-01: qty 20

## 2024-10-01 MED ORDER — DROPERIDOL 2.5 MG/ML IJ SOLN
0.6250 mg | Freq: Once | INTRAMUSCULAR | Status: AC
  Administered 2024-10-01: 0.625 mg via INTRAVENOUS

## 2024-10-01 MED ORDER — CHLORHEXIDINE GLUCONATE CLOTH 2 % EX PADS: 6.0000 | MEDICATED_PAD | Freq: Once | CUTANEOUS | Status: DC

## 2024-10-01 MED ORDER — PROTAMINE SULFATE 10 MG/ML IV SOLN
INTRAVENOUS | Status: DC | PRN
Start: 2024-10-01 — End: 2024-10-01
  Administered 2024-10-01: 20 mg via INTRAVENOUS

## 2024-10-01 MED ORDER — INSULIN ASPART 100 UNIT/ML IJ SOLN: 0.0000 [IU] | INTRAMUSCULAR | Status: DC | PRN

## 2024-10-01 MED ORDER — DEXAMETHASONE SODIUM PHOSPHATE 10 MG/ML IJ SOLN
INTRAMUSCULAR | Status: DC | PRN
  Administered 2024-10-01: 5 mg via INTRAVENOUS

## 2024-10-01 MED ORDER — LIDOCAINE 2% (20 MG/ML) 5 ML SYRINGE
INTRAMUSCULAR | Status: DC | PRN
Start: 2024-10-01 — End: 2024-10-01
  Administered 2024-10-01: 80 mg via INTRAVENOUS

## 2024-10-01 MED ORDER — FENTANYL CITRATE (PF) 250 MCG/5ML IJ SOLN
INTRAMUSCULAR | Status: AC
  Filled 2024-10-01: qty 5

## 2024-10-01 MED ORDER — OXYCODONE HCL 5 MG PO TABS: 5.0000 mg | ORAL_TABLET | Freq: Once | ORAL | Status: DC | PRN

## 2024-10-01 MED ORDER — ONDANSETRON HCL 4 MG/2ML IJ SOLN
INTRAMUSCULAR | Status: DC | PRN
  Administered 2024-10-01: 4 mg via INTRAVENOUS

## 2024-10-01 MED ORDER — PROPOFOL 10 MG/ML IV BOLUS
INTRAVENOUS | Status: DC | PRN
  Administered 2024-10-01: 120 mg via INTRAVENOUS

## 2024-10-01 MED ORDER — ORAL CARE MOUTH RINSE: 15.0000 mL | Freq: Once | OROMUCOSAL | Status: AC

## 2024-10-01 MED ORDER — LIDOCAINE HCL (PF) 1 % IJ SOLN
INTRAMUSCULAR | Status: AC
  Filled 2024-10-01: qty 30

## 2024-10-01 MED ORDER — HYDROMORPHONE HCL 1 MG/ML IJ SOLN
INTRAMUSCULAR | Status: AC
  Filled 2024-10-01: qty 1

## 2024-10-01 MED ORDER — FENTANYL CITRATE (PF) 100 MCG/2ML IJ SOLN
INTRAMUSCULAR | Status: AC
  Filled 2024-10-01: qty 2

## 2024-10-01 MED ORDER — PHENYLEPHRINE 80 MCG/ML (10ML) SYRINGE FOR IV PUSH (FOR BLOOD PRESSURE SUPPORT)
PREFILLED_SYRINGE | INTRAVENOUS | Status: DC | PRN
  Administered 2024-10-01: 160 ug via INTRAVENOUS
  Administered 2024-10-01: 80 ug via INTRAVENOUS

## 2024-10-01 MED ORDER — SODIUM CHLORIDE 0.9 % IV SOLN: INTRAVENOUS | Status: DC

## 2024-10-01 MED ORDER — HEPARIN 6000 UNIT IRRIGATION SOLUTION
Status: DC | PRN
  Administered 2024-10-01: 1

## 2024-10-01 MED ORDER — OXYCODONE HCL 5 MG/5ML PO SOLN: 5.0000 mg | Freq: Once | ORAL | Status: DC | PRN

## 2024-10-01 MED ORDER — PHENYLEPHRINE HCL-NACL 20-0.9 MG/250ML-% IV SOLN
INTRAVENOUS | Status: DC | PRN
  Administered 2024-10-01: 45 ug/min via INTRAVENOUS

## 2024-10-01 MED ORDER — EPHEDRINE SULFATE-NACL 50-0.9 MG/10ML-% IV SOSY
PREFILLED_SYRINGE | INTRAVENOUS | Status: DC | PRN
  Administered 2024-10-01: 5 mg via INTRAVENOUS

## 2024-10-01 MED ORDER — VANCOMYCIN HCL IN DEXTROSE 1-5 GM/200ML-% IV SOLN
1000.0000 mg | INTRAVENOUS | Status: AC
Start: 2024-10-01 — End: 2024-10-01
  Administered 2024-10-01: 1000 mg via INTRAVENOUS
  Filled 2024-10-01: qty 200

## 2024-10-01 MED ORDER — ONDANSETRON HCL 4 MG/2ML IJ SOLN: 4.0000 mg | Freq: Once | INTRAMUSCULAR | Status: DC | PRN

## 2024-10-01 MED ORDER — MIDAZOLAM HCL 2 MG/2ML IJ SOLN
INTRAMUSCULAR | Status: AC
  Filled 2024-10-01: qty 2

## 2024-10-01 MED ORDER — HEPARIN SODIUM (PORCINE) 1000 UNIT/ML IJ SOLN
INTRAMUSCULAR | Status: DC | PRN
  Administered 2024-10-01: 4000 [IU] via INTRAVENOUS

## 2024-10-01 MED ORDER — FENTANYL CITRATE (PF) 100 MCG/2ML IJ SOLN: 25.0000 ug | INTRAMUSCULAR | Status: DC | PRN

## 2024-10-01 MED ORDER — TRAMADOL HCL 50 MG PO TABS: 50.0000 mg | ORAL_TABLET | Freq: Four times a day (QID) | ORAL | 0 refills | Status: DC | PRN

## 2024-10-01 MED ORDER — DROPERIDOL 2.5 MG/ML IJ SOLN
INTRAMUSCULAR | Status: AC
  Filled 2024-10-01: qty 2

## 2024-10-01 MED ORDER — HEPARIN 6000 UNIT IRRIGATION SOLUTION
Status: AC
  Filled 2024-10-01: qty 500

## 2024-10-01 MED ORDER — CHLORHEXIDINE GLUCONATE 0.12 % MT SOLN
15.0000 mL | Freq: Once | OROMUCOSAL | Status: AC
  Administered 2024-10-01: 15 mL via OROMUCOSAL
  Filled 2024-10-01: qty 15

## 2024-10-01 MED ORDER — HYDROMORPHONE HCL 1 MG/ML IJ SOLN
0.2500 mg | INTRAMUSCULAR | Status: DC | PRN
  Administered 2024-10-01: 0.5 mg via INTRAVENOUS

## 2024-10-01 MED ORDER — LIDOCAINE HCL 1 % IJ SOLN
INTRAMUSCULAR | Status: DC | PRN
  Administered 2024-10-01: 8 mL via INTRADERMAL

## 2024-10-01 MED ORDER — FENTANYL CITRATE (PF) 250 MCG/5ML IJ SOLN
INTRAMUSCULAR | Status: DC | PRN
  Administered 2024-10-01: 100 ug via INTRAVENOUS

## 2024-10-01 MED ORDER — 0.9 % SODIUM CHLORIDE (POUR BTL) OPTIME
TOPICAL | Status: DC | PRN
  Administered 2024-10-01: 1000 mL

## 2024-10-01 MED ORDER — SUGAMMADEX SODIUM 200 MG/2ML IV SOLN
INTRAVENOUS | Status: DC | PRN
Start: 2024-10-01 — End: 2024-10-01
  Administered 2024-10-01: 200 mg via INTRAVENOUS

## 2024-10-01 SURGICAL SUPPLY — 32 items
ARMBAND PINK RESTRICT EXTREMIT (MISCELLANEOUS) ×2 IMPLANT
BAG COUNTER SPONGE SURGICOUNT (BAG) ×1 IMPLANT
BLADE CLIPPER SURG (BLADE) ×2 IMPLANT
BNDG ELASTIC 4X5.8 VLCR STR LF (GAUZE/BANDAGES/DRESSINGS) ×2 IMPLANT
CANISTER SUCTION 3000ML PPV (SUCTIONS) ×2 IMPLANT
CLIP TI MEDIUM 6 (CLIP) ×2 IMPLANT
CLIP TI WIDE RED SMALL 6 (CLIP) ×2 IMPLANT
COVER PROBE W GEL 5X96 (DRAPES) ×2 IMPLANT
DERMABOND ADVANCED .7 DNX12 (GAUZE/BANDAGES/DRESSINGS) ×2 IMPLANT
ELECTRODE REM PT RTRN 9FT ADLT (ELECTROSURGICAL) ×2 IMPLANT
GLOVE BIOGEL PI IND STRL 8 (GLOVE) ×2 IMPLANT
GOWN STRL REUS W/ TWL LRG LVL3 (GOWN DISPOSABLE) ×5 IMPLANT
GOWN STRL REUS W/TWL 2XL LVL3 (GOWN DISPOSABLE) ×3 IMPLANT
GRAFT GORETEX STRT 4-7X45 (Vascular Products) ×1 IMPLANT
HEMOSTAT SNOW SURGICEL 2X4 (HEMOSTASIS) ×2 IMPLANT
KIT BASIN OR (CUSTOM PROCEDURE TRAY) ×2 IMPLANT
KIT TURNOVER KIT B (KITS) ×2 IMPLANT
PACK CV ACCESS (CUSTOM PROCEDURE TRAY) ×2 IMPLANT
PAD ARMBOARD POSITIONER FOAM (MISCELLANEOUS) ×4 IMPLANT
SLING ARM FOAM STRAP LRG (SOFTGOODS) IMPLANT
SOLN 0.9% NACL 1000 ML (IV SOLUTION) ×2 IMPLANT
SOLN 0.9% NACL POUR BTL 1000ML (IV SOLUTION) ×2 IMPLANT
SOLN STERILE WATER 1000 ML (IV SOLUTION) ×2 IMPLANT
SOLN STERILE WATER BTL 1000 ML (IV SOLUTION) ×2 IMPLANT
SPIKE FLUID TRANSFER (MISCELLANEOUS) ×1 IMPLANT
SUT MNCRL AB 4-0 PS2 18 (SUTURE) ×2 IMPLANT
SUT PROLENE 6 0 BV (SUTURE) ×4 IMPLANT
SUT PROLENE 7 0 BV 1 (SUTURE) IMPLANT
SUT SILK 2 0 SH (SUTURE) ×1 IMPLANT
SUT VIC AB 3-0 SH 27X BRD (SUTURE) ×3 IMPLANT
TOWEL GREEN STERILE (TOWEL DISPOSABLE) ×2 IMPLANT
UNDERPAD 30X36 HEAVY ABSORB (UNDERPADS AND DIAPERS) ×2 IMPLANT

## 2024-10-01 NOTE — Transfer of Care (Signed)
 Immediate Anesthesia Transfer of Care Note  Patient: [name] [name]  Procedure(s) Performed: INSERTION RIGHT UPPER EXTREMITY ARTERIOVENOUS (AV) GORE-TEX GRAFT (Right: Arm Upper)  Patient Location: PACU  Anesthesia Type:General  Level of Consciousness: awake and alert   Airway & Oxygen Therapy: Patient Spontanous Breathing and Patient connected to face mask oxygen  Post-op Assessment: Report given to RN and Post -op Vital signs reviewed and stable  Post vital signs: Reviewed and stable  Last Vitals:  Vitals Value Taken Time  BP 109/55 10/01/24 09:30  Temp    Pulse 75 10/01/24 09:31  Resp 22 10/01/24 09:31  SpO2 99 % 10/01/24 09:31  Vitals shown include unfiled device data.  Last Pain:  Vitals:   10/01/24 0552  TempSrc: Oral      Patients Stated Pain Goal: 4 (10/01/24 9362)  Complications: No notable events documented.

## 2024-10-01 NOTE — Progress Notes (Signed)
 0.5 mg IV Dilaudid  wasted in stericycle with Larraine HERO RN after patient discharge.

## 2024-10-01 NOTE — Discharge Instructions (Signed)
 [facility]  Discharge Instructions  AV Fistula or Graft Surgery for Dialysis Access  Please refer to the following instructions for your post-procedure care. Your surgeon or physician assistant will discuss any changes with you.  Activity  You may drive the day following your surgery, if you are comfortable and no longer taking prescription pain medication. Resume full activity as the soreness in your incision resolves.  Bathing/Showering  You may shower after you go home. Keep your incision dry for 48 hours. Do not soak in a bathtub, hot tub, or swim until the incision heals completely. You may not shower if you have a hemodialysis catheter.  Incision Care  Clean your incision with mild soap and water  after 48 hours. Pat the area dry with a clean towel. You do not need a bandage unless otherwise instructed. Do not apply any ointments or creams to your incision. You may have skin glue on your incision. Do not peel it off. It will come off on its own in about one week. Your arm may swell a bit after surgery. To reduce swelling use pillows to elevate your arm so it is above your heart. Your doctor will tell you if you need to lightly wrap your arm with an ACE bandage.  Diet  Resume your normal diet. There are not special food restrictions following this procedure. In order to heal from your surgery, it is CRITICAL to get adequate nutrition. Your body requires vitamins, minerals, and protein. Vegetables are the best source of vitamins and minerals. Vegetables also provide the perfect balance of protein. Processed food has little nutritional value, so try to avoid this.  Medications  Resume taking all of your medications. If your incision is causing pain, you may take over-the counter pain relievers such as acetaminophen  (Tylenol ). If you were prescribed a stronger pain medication, please be aware these medications can cause nausea and constipation. Prevent  nausea by taking the medication with a snack or meal. Avoid constipation by drinking plenty of fluids and eating foods with high amount of fiber, such as fruits, vegetables, and grains.  Do not take Tylenol  if you are taking prescription pain medications.  Follow up Your surgeon may want to see you in the office following your access surgery. If so, this will be arranged at the time of your surgery.  Please call us  immediately for any of the following conditions:  Increased pain, redness, drainage (pus) from your incision site Fever of 101 degrees or higher Severe or worsening pain at your incision site Hand pain or numbness.  Reduce your risk of vascular disease:  Stop smoking. If you would like help, call [facility] at 1-800-QUIT-NOW (838 707 4434) or [place] at [phone]  Manage your cholesterol Maintain a desired weight Control your diabetes Keep your blood pressure down  Dialysis  It will take several weeks to several months for your new dialysis access to be ready for use. Your surgeon will determine when it is okay to use it. Your nephrologist will continue to direct your dialysis. You can continue to use your Permcath until your new access is ready for use.   10/01/2024 COBI ALDAPE [id number] 1966/12/11  Surgeon(s): Lanis Fonda BRAVO, MD  Procedure(s): INSERTION RIGHT UPPER EXTREMITY ARTERIOVENOUS (AV) GORE-TEX GRAFT   May stick graft immediately   May stick graft on designated area only:   X Do not stick right AV graft for 6 weeks    If you have any questions, please call  the office at [phone].
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
## 2024-10-01 NOTE — Progress Notes (Signed)
 Reviewed and agree with Dr Rennis plan.

## 2024-10-01 NOTE — Op Note (Signed)
 NAME: [name] [name]    MRN: [id number] DOB: [birth year]    DATE OF OPERATION: 10/01/2024  PREOP DIAGNOSIS:    End stage renal disease  POSTOP DIAGNOSIS:    Same  PROCEDURE:    Right arm AV graft creation - Brachial artery to axillary vein  SURGEON: Fonda FORBES Rim  ASSIST: Donnice Sender  ANESTHESIA: General   EBL: 20ml  INDICATIONS:    [name] [name] is a 58 y.o. female with end stage renal disease in need of long term HD access.   FINDINGS:    7mm axillary vein 3.5 mm brachial artery  TECHNIQUE:   After informed consent was obtained, the patient was brought to the operating room, placed supine on the operating room table.  General anesthesia was used.  The patient was prepped and draped in normal sterile fashion.  Surgical time-out was taken. Pre-op  antibiotics were given.  Procedure began with using ultrasound and sent the brachial artery and axillary vein in the right arm.  Both proved of sufficient size to continue with AV graft.  A longitudinal incision was made at the distal aspect of the humerus. The brachial artery was identified, gently dissected, and encircled with silastic loops. A second incision was then made over the axillary vein and similarly dissected and encircled. The tunneling device was then passed between these incisions. A 4 X 7 taper PTFE graft was passed through the tunnel, taking care to avoid kinking or twists. The graft was irrigated with heparinized saline solution.   Proximal and distal arterial control was then obtained and a longitudinal arteriotomy was made on the brachial artery. The artery was irrigated with heparinized saline solution. An end to side artery anastomosis was then performed from the 4mm graft to the brachial vein in a running fashion using running 6-0 prolene suture. When the clamps were removed from the artery, the graft demonstrated excellent inflow. It was flushed with heparinized saline prior to being clamped. There  was an excellent radial signal at the wrist.  Attention was then directed to the axillary vein anastomosis. Proximal and distal control were obtained and a longitudinal venotomy was made. The vein was then irrigated with heparinized saline solution. The end of the PTFE graft was then cut in a beveled fashion at the appropriate length. An end to side vein anastomosis was then performed, using a running, 6-0 prolene suture. Prior to establishing flow, all vessels were back bled and flushed to ensure there was no clot.    There was a strong distal signal in the radial artery and a thrill in the vein centrally. Hemostasis was found to be satisfactory, and all wounds were irrigated with saline solution and closed in layers with vicryl and Monocryl at the skin. A dry sterile dressing was applied. Anesthetic care was terminated. The patient was transported to the recovery room in stable condition.  Given the complexity of the case,  the assistant was necessary in order to expedient the procedure and safely perform the technical aspects of the operation.  The assistant provided traction and countertraction to assist with exposure of the artery and vein.  They also assisted with suture ligation of multiple venous branches. They also assisted with tunneling of the graft.  They played a critical role for both anastomoses.. These skills, especially following the Prolene suture for the anastomosis, could not have been adequately performed by a scrub tech assistant.     Fonda FORBES Rim, MD [facility]  DATE OF DICTATION:   10/01/2024

## 2024-10-01 NOTE — Anesthesia Procedure Notes (Signed)
 Procedure Name: Intubation Date/Time: 10/01/2024 8:00 AM  Performed by: Boyce Shilling, CRNAPre-anesthesia Checklist: Patient identified, Emergency Drugs available, Suction available, Timeout performed and Patient being monitored Patient Re-evaluated:Patient Re-evaluated prior to induction Oxygen Delivery Method: Circle system utilized Preoxygenation: Pre-oxygenation with 100% oxygen Induction Type: IV induction Ventilation: Mask ventilation without difficulty and Oral airway inserted - appropriate to patient size Laryngoscope Size: Mac and 3 Grade View: Grade I Tube type: Oral Tube size: 7.5 mm Number of attempts: 1 Airway Equipment and Method: Stylet Placement Confirmation: ETT inserted through vocal cords under direct vision, positive ETCO2, CO2 detector and breath sounds checked- equal and bilateral Secured at: 22 cm Tube secured with: Tape Dental Injury: Teeth and Oropharynx as per pre-operative assessment

## 2024-10-01 NOTE — Anesthesia Postprocedure Evaluation (Signed)
 Anesthesia Post Note  Patient: [address]  Procedure(s) Performed: INSERTION RIGHT UPPER EXTREMITY ARTERIOVENOUS (AV) GORE-TEX GRAFT (Right: Arm Upper)     Patient location during evaluation: PACU Anesthesia Type: General Level of consciousness: awake and alert Pain management: pain level controlled Vital Signs Assessment: post-procedure vital signs reviewed and stable Respiratory status: spontaneous breathing, nonlabored ventilation, respiratory function stable and patient connected to nasal cannula oxygen Cardiovascular status: blood pressure returned to baseline and stable Postop Assessment: no apparent nausea or vomiting Anesthetic complications: no   No notable events documented.  Last Vitals:  Vitals:   10/01/24 0945 10/01/24 1000  BP: (!) 118/41 (!) 128/52  Pulse: 70 69  Resp: 20 (!) 24  Temp:  36.5 C  SpO2: 96% 94%    Last Pain:  Vitals:   10/01/24 0928  TempSrc:   PainSc: 0-No pain                 Lynwood MARLA Cornea

## 2024-10-02 ENCOUNTER — Encounter (HOSPITAL_COMMUNITY): Payer: Self-pay | Admitting: Vascular Surgery

## 2024-10-05 ENCOUNTER — Other Ambulatory Visit: Payer: Self-pay | Admitting: *Deleted

## 2024-10-05 ENCOUNTER — Other Ambulatory Visit: Payer: Self-pay

## 2024-10-05 NOTE — Patient Instructions (Signed)
 Visit Information  Thank you for taking time to visit with me today. Please don't hesitate to contact me if I can be of assistance to you before our next scheduled appointment.  Your next care management appointment is by telephone on 11/05/24 at 9:15 am  Please call the care guide team at [phone] if you need to cancel, schedule, or reschedule an appointment.   Please call the Suicide and Crisis Lifeline: 988 call the [place]  National Suicide Prevention Lifeline: 417-426-8347 or TTY: 269-194-7565 TTY 612-369-6874) to talk to a trained counselor call 1-800-273-TALK (toll free, 24 hour hotline) go to [facility] [address], [place] [phone]) call the [facility] Crisis Line: (215)[phone] call 911 if you are experiencing a Mental Health or Behavioral Health Crisis or need someone to talk to.  Tawnya Pujol, RN, BSN, Theatre manager [employer] [phone]
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
## 2024-10-09 ENCOUNTER — Other Ambulatory Visit: Payer: Self-pay | Admitting: Licensed Clinical Social Worker
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
## 2024-10-09 NOTE — Patient Instructions (Signed)
 Visit Information  Thank you for taking time to visit with me today. Please don't hesitate to contact me if I can be of assistance to you before our next scheduled appointment.  Our next appointment is by telephone on 10/24/24 at 10:30 PM    Please call the care guide team at [phone] if you need to cancel or reschedule your appointment.   Following is a copy of your care plan:   Goals Addressed             This Visit's Progress    VBCI Social Work Care Plan       Problems:   Grief issues faced              Transport needs              Depression needs              Medical needs management   CSW Clinical Goal(s):   Over the next 30  days the Patient will attend all scheduled medical appointments as evidenced by patient report and care team review of appointment completion in electronic medical record.             Over next 30 days, client will use coping skills to manage stress issues faced AEB client report of decrease in stress symptoms. (Plays computer games, talks with family via phone).    Interventions:  Discussed pain issues of client Discussed grief issues of client. She expressed grief related to death of her son, 6 years ago.            Discussed medication procurement for client             Discussed family support for client              Client has periodic episodes of vomiting. (Cyclic Vomiting Disease CVD)             Client has periodic headaches.  Client has reduced sleep.  She said it is hard for her to sleep at night              Discussed transport needs.                Discussed medical care with PCP              Appetite is decreased. She may eat 1-2 meals per day. She is trying to stay away from fast foods             Discussed kidney issues faced.  She said she has graft in place for dialysis but has not started dialysis at present              Discussed mood of client. She spoke of PTSD.  She spoke of death of her brother in the past.  She spoke of  PTSD and grief issues. She said Winter months are difficult related to her mood. Brother died in 2024/11/23 in the 1970s. . Sister died when client was young.  Son died 6 years ago.   Discussed counseling support for client. Discussed [facility] in [place], [place] as a possible counseling resource for client. Discussed [facility] in [place], [place] as possible counseling resource agency for client. Discussed local Hospice services for possible grief support.                 Used Active Listening to allow client to share her feelings and issues of  concern               Provided counseling support                Client said she talks with her brother in [place] . She talks with a cousin in [place]                 Encouraged client to call LCSW as needed at [phone].                Client was appreciative of call from LCSW today   Patient Goals/Self-Care Activities:  Take medications as prescribed                Attend appointments with PCP as scheduled               Call LCSW as needed for SW support               Consider calling counseling agencies of choice to inquire about possible counseling support for client               Allow time for stress reduction activities  Plan:   Telephone follow up appointment with care management team member scheduled for:  10/24/24 at 10:30 AM        Please go to [facility] [address], [place] [phone]) if you are experiencing a Mental Health or Behavioral Health Crisis or need someone to talk to.  The patient verbalized understanding of instructions, educational materials, and care plan provided today and DECLINED offer to receive copy of patient instructions, educational materials, and care plan.    [name] [name]  MSW, LCSW [place]/Value Based Care [facility] Licensed Clinical Social Worker Direct Dial :  [phone] Fax:   [phone] Website:  [email or web address]

## 2024-10-10 ENCOUNTER — Other Ambulatory Visit: Payer: Self-pay

## 2024-10-10 NOTE — Patient Instructions (Signed)
 Visit Information  Thank you for taking time to visit with me today. Please don't hesitate to contact me if I can be of assistance to you before our next scheduled appointment.  Our next appointment is by telephone on 10/26/2024 at 10AM Please call the care guide team at [phone] if you need to cancel or reschedule your appointment.   Following is a copy of your care plan:   Goals Addressed             This Visit's Progress    BSW VBCI Social Work Care Plan   On track    Problems:   Food Insecurity   CSW Clinical Goal(s):   Over the next 3 weeks the Patient will work with Child psychotherapist to address concerns related to food insecurity.  Interventions:  BSW will provide food market schedule for out of the garden project.   Patient Goals/Self-Care Activities:  Continue to work on adjusting to cooking different and different recipes.   Plan:   Telephone follow up appointment with care management team member scheduled for:  10/26/2024 at 10AM        Please call the Suicide and Crisis Lifeline: 988 call 1-800-273-TALK (toll free, 24 hour hotline) call 911 if you are experiencing a Mental Health or Behavioral Health Crisis or need someone to talk to.  Patient verbalizes understanding of instructions and care plan provided today and agrees to view in MyChart. Active MyChart status and patient understanding of how to access instructions and care plan via MyChart confirmed with patient.     [name] [name], BSW [place]/VBCI - [employer] Social Worker [phone]
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
## 2024-10-11 ENCOUNTER — Encounter: Payer: Self-pay | Admitting: Pharmacist

## 2024-10-11 ENCOUNTER — Ambulatory Visit: Admitting: Pharmacist

## 2024-10-11 VITALS — BP 151/55 | HR 70 | Wt 150.4 lb

## 2024-10-11 DIAGNOSIS — I152 Hypertension secondary to endocrine disorders: Secondary | ICD-10-CM

## 2024-10-11 DIAGNOSIS — N184 Chronic kidney disease, stage 4 (severe): Secondary | ICD-10-CM | POA: Diagnosis not present

## 2024-10-11 DIAGNOSIS — E1159 Type 2 diabetes mellitus with other circulatory complications: Secondary | ICD-10-CM | POA: Diagnosis not present

## 2024-10-11 DIAGNOSIS — E1122 Type 2 diabetes mellitus with diabetic chronic kidney disease: Secondary | ICD-10-CM | POA: Diagnosis not present

## 2024-10-11 DIAGNOSIS — Z794 Long term (current) use of insulin: Secondary | ICD-10-CM

## 2024-10-11 MED ORDER — FREESTYLE LIBRE 3 PLUS SENSOR MISC
11 refills | Status: DC
Start: 2024-10-11 — End: 2024-11-05

## 2024-10-11 NOTE — Assessment & Plan Note (Signed)
 Hypertension longstanding since 2020 currently uncontrolled on hydralazine  and diltiazem  with in office Blood Pressure today 155/53 and 148/53. Blood pressure goal of <130/80 mmHg. Medication adherence good. Blood pressure control is suboptimal due to insufficient antihypertensive regimen. -Continue hydralazine  25mg  TID and diltiazem  180 mg daily.  -Consider adding additional antihypertensives at future visit.

## 2024-10-11 NOTE — Patient Instructions (Addendum)
 It was nice to see you today!  Your goal blood sugar is 80-130 before eating and less than 180 after eating.  Medication Changes:  Continue all other medication the same.  Prescription for Libre 3 Plus was sent to pharmacy.  Keep up the good work with diet and exercise. Aim for a diet full of vegetables, fruit and lean meats (chicken, [place], fish). Try to limit salt intake by eating fresh or frozen vegetables (instead of canned), rinse canned vegetables prior to cooking and do not add any additional salt to meals.

## 2024-10-11 NOTE — Progress Notes (Signed)
 //  S:     Chief Complaint  Patient presents with   Medication Management    Diabetes management   58 y.o. female who presents for diabetes evaluation, education, and management. Patient arrives in okay spirits and presents without any assistance. Patient is saddened about progression towards needing dialysis, also shared that her dialysis graft is healing well. Reports minor discomfort intermittently.   Patient was referred and last seen by Primary Care Provider, Dr. McDiarmid, on 09/06/24.  At last visit, patient was referred for diabetes management and CGM placement.   PMH is significant for ESRD, T2DM, hypertension, hyperlipidemia Patient reports Diabetes was diagnosed in 2017.   Current diabetes medications include: Lantus  (insulin  glargine) 2 units daily Current hypertension medications include: Hydralazine  25 mg TID and Diltiazem  180 mg daily Current hyperlipidemia medications include: none; reports severe muscle cramps with statins and ezetimibe   Patient reports adherence to taking all medications as prescribed.  Patient reports few hypoglycemic events.  O:  Review of Systems  Cardiovascular:  Negative for leg swelling.  Gastrointestinal:  Negative for vomiting.  All other systems reviewed and are negative.  Physical Exam Vitals reviewed.  Constitutional:      Appearance: Normal appearance.  Pulmonary:     Effort: Pulmonary effort is normal.  Musculoskeletal:     Right lower leg: No edema.     Left lower leg: No edema.  Neurological:     Mental Status: She is alert.  Psychiatric:        Mood and Affect: Mood normal.        Behavior: Behavior normal.        Thought Content: Thought content normal.        Judgment: Judgment normal.    Libre3 CGM Download today 09/25/24-10/08/24 % Time CGM is active: 77% Average Glucose: 133 mg/dL Glucose Management Indicator: 6.5  Glucose Variability: 36.8% (goal <36%) Time in Goal:  - Time in range 70-180: 78% - Time above  range: 16% - Time below range: 6%  Lab Results  Component Value Date   HGBA1C 6.1 06/26/2024   Vitals:   10/11/24 1122 10/11/24 1123  BP: (!) 148/53 (!) 151/55  Pulse: 70   SpO2: 97%     Lipid Panel     Component Value Date/Time   CHOL 117 03/04/2023 1304   TRIG 49 03/04/2023 1304   HDL 69 03/04/2023 1304   CHOLHDL 1.7 03/04/2023 1304   CHOLHDL 3.9 08/03/2016 1322   VLDL 29 08/03/2016 1322   LDLCALC 36 03/04/2023 1304   LDLDIRECT 102 (H) 02/14/2012 1137    Clinical Atherosclerotic Cardiovascular Disease (ASCVD): Yes  A/P: Diabetes longstanding since 2017 currently controlled on insulin , since last visit patient experience a night of prolonged hypoglycemia.  She reduced her Lantus  (insulin  glargine) to 2 units daily.  Patient is able to verbalize appropriate hypoglycemia management plan. Medication adherence appears good.  Control based on CGM appears to be optimized, no low and good control following recent adjustment.  -Continued basal insulin  Lantus  (insulin  glargine) 2 units daily  - Assisted patient to replace CGM - Libre 3+ sensor while in office and connected to her phone.  -Counseled patient on identifying glucose lows and eating a snack as needed to prevent.  ASCVD risk - primary  in patient with diabetes. Last LDL is 36 in 07/2023 which is at goal of <70  mg/dL. ASCVD risk factors include diabetes. Intolerant to statins and ezetimibe  due to myopathy.   Hypertension longstanding since 2020 currently  uncontrolled on hydralazine  and diltiazem  with in office Blood Pressure today 155/53 and 148/53. Blood pressure goal of <130/80 mmHg. Medication adherence good. Blood pressure control is suboptimal due to insufficient antihypertensive regimen. -Continue hydralazine  25mg  TID and diltiazem  180 mg daily.  -Consider adding additional antihypertensives at future visit.   Written patient instructions provided. Patient verbalized understanding of treatment plan.  Total time in  face to face counseling 22 minutes.    Follow-up:  Pharmacist 11/19/2024 PCP clinic visit 10/23 with Dr. McDiarmid Patient seen with Lawson Mao, PharmD Candidate - PY3 student and Belvie Macintosh, PharmD - PY4 Candidate.

## 2024-10-11 NOTE — Assessment & Plan Note (Addendum)
 Diabetes longstanding since 2017 currently controlled on insulin , since last visit patient experience a night of prolonged hypoglycemia.  She reduced her Lantus  (insulin  glargine) to 2 units daily.  Patient is able to verbalize appropriate hypoglycemia management plan. Medication adherence appears good.  Control based on CGM appears to be optimized, no low and good control following recent adjustment.  -Continued basal insulin  Lantus  (insulin  glargine) 2 units daily  - Assisted patient to replace CGM - Libre 3+ sensor while in office and connected to her phone.  -Counseled patient on identifying glucose lows and eating a snack as needed to prevent.

## 2024-10-12 ENCOUNTER — Telehealth: Payer: Self-pay | Admitting: Family Medicine

## 2024-10-12 ENCOUNTER — Other Ambulatory Visit: Payer: Self-pay

## 2024-10-12 DIAGNOSIS — F325 Major depressive disorder, single episode, in full remission: Secondary | ICD-10-CM

## 2024-10-12 DIAGNOSIS — R1115 Cyclical vomiting syndrome unrelated to migraine: Secondary | ICD-10-CM

## 2024-10-12 MED ORDER — LORAZEPAM 1 MG PO TABS: 1.0000 mg | ORAL_TABLET | Freq: Three times a day (TID) | ORAL | 5 refills | Status: DC | PRN

## 2024-10-12 MED ORDER — OXYCODONE HCL 5 MG PO TABS: 5.0000 mg | ORAL_TABLET | ORAL | 0 refills | Status: DC | PRN

## 2024-10-12 NOTE — Telephone Encounter (Signed)
Form was fax'd

## 2024-10-12 NOTE — Telephone Encounter (Signed)
 Patient called asking about the form for her Nepo drinks, it was re-faxed to us  on 10/2 and she was told if was missing information. Wants to make we got it and was faxed back.

## 2024-10-18 ENCOUNTER — Encounter: Payer: Self-pay | Admitting: Family Medicine

## 2024-10-18 ENCOUNTER — Ambulatory Visit (INDEPENDENT_AMBULATORY_CARE_PROVIDER_SITE_OTHER): Admitting: Family Medicine

## 2024-10-18 VITALS — BP 145/60 | HR 71 | Ht 61.0 in | Wt 152.4 lb

## 2024-10-18 DIAGNOSIS — Z01810 Encounter for preprocedural cardiovascular examination: Secondary | ICD-10-CM

## 2024-10-18 DIAGNOSIS — I10 Essential (primary) hypertension: Secondary | ICD-10-CM | POA: Diagnosis not present

## 2024-10-18 DIAGNOSIS — N952 Postmenopausal atrophic vaginitis: Secondary | ICD-10-CM

## 2024-10-18 DIAGNOSIS — E1122 Type 2 diabetes mellitus with diabetic chronic kidney disease: Secondary | ICD-10-CM

## 2024-10-18 DIAGNOSIS — F17210 Nicotine dependence, cigarettes, uncomplicated: Secondary | ICD-10-CM | POA: Insufficient documentation

## 2024-10-18 DIAGNOSIS — Z23 Encounter for immunization: Secondary | ICD-10-CM

## 2024-10-18 DIAGNOSIS — N184 Chronic kidney disease, stage 4 (severe): Secondary | ICD-10-CM

## 2024-10-18 DIAGNOSIS — Z794 Long term (current) use of insulin: Secondary | ICD-10-CM

## 2024-10-18 DIAGNOSIS — I152 Hypertension secondary to endocrine disorders: Secondary | ICD-10-CM

## 2024-10-18 DIAGNOSIS — Z720 Tobacco use: Secondary | ICD-10-CM

## 2024-10-18 LAB — POCT GLYCOSYLATED HEMOGLOBIN (HGB A1C): HbA1c, POC (controlled diabetic range): 6.7 % (ref 0.0–7.0)

## 2024-10-18 NOTE — Patient Instructions (Signed)
 We are checking the kidney labs that Dr Marlee usually check.   A referral was made back to [facility] for your stress echocardiogram and transthoracic echocardiogram.

## 2024-10-19 ENCOUNTER — Encounter: Payer: Self-pay | Admitting: Family Medicine

## 2024-10-19 NOTE — Assessment & Plan Note (Signed)
 Lab Results  Component Value Date   HGBA1C 6.7 10/18/2024   Taking Lantus  8 to 12 units.  No hypoglyemic events.  Established problem Well Controlled. Patient is at goal of <7.5%. No signs of complications, medication side effects, or red flags. Continue Lantus  8-12 units qam

## 2024-10-19 NOTE — Progress Notes (Signed)
 [name] [name] is alone Sources of clinical information for visit is/are patient and Nephrology last office visit note. Nursing assessment for this office visit was reviewed with the patient for accuracy and revision.      10/09/2024   11:55 AM  Depression screen PHQ 2/9  Decreased Interest 1  Down, Depressed, Hopeless 1  PHQ - 2 Score 2  Altered sleeping 1  Tired, decreased energy 1  Change in appetite 0  Feeling bad or failure about yourself  1  Trouble concentrating 1  Moving slowly or fidgety/restless 1  Suicidal thoughts 0  PHQ-9 Score 7  Difficult doing work/chores Somewhat difficult   Flowsheet Row Patient Outreach Telephone from 10/09/2024 in [place] [facility] Clinical Support from 07/09/2024 in [facility] - [facility] Dept Of [place]. [facility] Office Visit from 06/26/2024 in [facility] Of [place]. [facility]  Thoughts that you would be better off dead, or of hurting yourself in some way Not at all Not at all Not at all  PHQ-9 Total Score 7 0 0       10/05/2024   10:07 AM 07/09/2024   10:40 AM 04/16/2024   11:15 AM 06/14/2023    3:15 PM 09/16/2021    3:01 PM  Fall Risk   Falls in the past year? 0 1 0 0 0  Number falls in past yr: 0 0  0 0  Injury with Fall? 0 1  0 0  Comment  LEFT SIDE     Risk for fall due to : No Fall Risks      Follow up Falls evaluation completed Falls evaluation completed;Education provided  Falls evaluation completed;Education provided;Falls prevention discussed        10/09/2024   11:55 AM 10/05/2024   10:12 AM 09/20/2024    3:12 PM  PHQ9 SCORE ONLY  PHQ-9 Total Score 7 1 1     There are no preventive care reminders to display for this patient.  Health Maintenance Due  Topic Date Due   Zoster Vaccines- Shingrix (1 of 2) Never done   DTaP/Tdap/Td (4 - Td or Tdap) 09/29/2023   Lung Cancer Screening  09/08/2024      History/P.E. limitations: none  There  are no preventive care reminders to display for this patient.  Diabetes Health Maintenance Due  Topic Date Due   OPHTHALMOLOGY EXAM  04/03/2025   HEMOGLOBIN A1C  04/18/2025    Health Maintenance Due  Topic Date Due   Zoster Vaccines- Shingrix (1 of 2) Never done   DTaP/Tdap/Td (4 - Td or Tdap) 09/29/2023   Lung Cancer Screening  09/08/2024     Chief Complaint  Patient presents with   Hypertension     Discussed the use of AI scribe software for clinical note transcription with the patient, who gave verbal consent to proceed.  History of Present Illness   [name] [name] is a 58 year old female with chronic kidney disease who presents for follow-up regarding her kidney function and recent symptoms.    Hypertension management - Hypertension managed with diltiazem  and hydralazine  - Hydralazine  25 mg tab, one tab recently increased to two tablets three times a day by Dr Marlee          SDOH Screenings   Food Insecurity: Food Insecurity Present (10/09/2024)  Housing: Unknown (10/09/2024)  Transportation Needs: No Transportation Needs (10/09/2024)  Utilities: Not At Risk (10/09/2024)  Alcohol  Screen: Low Risk  (07/09/2024)  Depression (PHQ2-9): Medium Risk (10/09/2024)  Financial Resource Strain: Low Risk  (07/09/2024)  Physical Activity: Inactive (10/09/2024)  Social Connections: Socially Isolated (07/09/2024)  Stress: Stress Concern Present (10/09/2024)  Tobacco Use: High Risk (10/18/2024)   --------------------------------------------------------------------------------------------------------------------------------------------- Visit Problem List with Assessment and Plan   Assessment and Plan      Essential hypertension         Type 2 diabetes mellitus with diabetic chronic kidney disease (HCC) Lab Results  Component Value Date   HGBA1C 6.7 10/18/2024   Taking Lantus  8 to 12 units.  No hypoglyemic events.  Established problem Well Controlled.  Patient is at goal of <7.5%. No signs of complications, medication side effects, or red flags. Continue Lantus  8-12 units qam   Chronic kidney disease (CKD) stage G4/A2, severely decreased glomerular filtration rate (GFR) between 15-29 mL/min/1.73 square meter and albuminuria creatinine ratio between 30-300 mg/g (HCC) Chronic kidney disease monitoring - Chronic kidney disease under regular surveillance by Dr Marlee. 10/6 had AV graft in RUE placed without complications.  Patient having some soreness and swelling distal to graft. No erythema/ Only mild tenderness.  Able to palpate pulse.  - Recent laboratory evaluation performed on September 29 - Additional laboratory testing completed on October 6 - Upcoming nephrology appointment scheduled for October 30 - Ms Bosch says she is suppose to have blood work drawn at [facility] but asks we draw it at our labs - Expresses concern regarding frequency bruising and right arm swelling after AV graft palcement 10/6  Type 2 diabetes mellitus with diabetic chronic kidney disease and end stage renal disease on transplant evaluation Undergoing kidney transplant evaluation at [facility] due to Chronic Kidney Disease 5 from diabetic chronic kidney disease. Experiencing stress related to transplant process and home dialysis classes.  RUE swelling expected to resolve. Discussed home dialysis challenges due to daughter's work schedule.  - Letter from [facility] about preparing for initial consultation about possibility of renal transplant states patient should obtain a transthoracic echocardiogram and a stress echocardiogram.   Plan - [facility] renal panel, hemoglobin, CBC, and PTH labs as this was what patient said she was suppose to get for Dr Marlee.  - Coordinate with Dr. Marlee for lab results. - Referral to [facility] cardiology for consideration of providing transthoracic echocardiogram and stress echo as request in letter from [facility] transplant department.   Hypertension  associated with diabetes (HCC) Established problem that has improved, but has not meet goal of < 130.  Blood pressure slightly elevated at 145/6. Previously on carvedilol , now on diltiazem  and hydralazine .Discussed carvedilol  benefits and her previous experience.   Plan Continue hydralazine  25mg  TID and diltiazem  180 mg daily.  Consider restarting patient's carvedilol  at future visit. She has tolerated it in the past and did show some reduction is SBP on it.    Atrophic vaginitis Patient stopped estrogen dream

## 2024-10-19 NOTE — Assessment & Plan Note (Signed)
 Chronic kidney disease monitoring - Chronic kidney disease under regular surveillance by Dr Marlee. 10/6 had AV graft in RUE placed without complications.  Patient having some soreness and swelling distal to graft. No erythema/ Only mild tenderness.  Able to palpate pulse.  - Recent laboratory evaluation performed on September 29 - Additional laboratory testing completed on October 6 - Upcoming nephrology appointment scheduled for October 30 - [name] [name] says she is suppose to have blood work drawn at [facility] but asks we draw it at our labs - Expresses concern regarding frequency bruising and right arm swelling after AV graft palcement 10/6  Type 2 diabetes mellitus with diabetic chronic kidney disease and end stage renal disease on transplant evaluation Undergoing kidney transplant evaluation at [facility] due to Chronic Kidney Disease 5 from diabetic chronic kidney disease. Experiencing stress related to transplant process and home dialysis classes.  RUE swelling expected to resolve. Discussed home dialysis challenges due to daughter's work schedule.  - Letter from [facility] about preparing for initial consultation about possibility of renal transplant states patient should obtain a transthoracic echocardiogram and a stress echocardiogram.   Plan - [facility] renal panel, hemoglobin, CBC, and PTH labs as this was what patient said she was suppose to get for Dr Marlee.  - Coordinate with Dr. Marlee for lab results. - Referral to [facility] cardiology for consideration of providing transthoracic echocardiogram and stress echo as request in letter from [facility] transplant department.

## 2024-10-19 NOTE — Assessment & Plan Note (Addendum)
 Established problem that has improved, but has not meet goal of < 130.  Blood pressure slightly elevated at 145/6. Previously on carvedilol , now on diltiazem  and hydralazine .Discussed carvedilol  benefits and her previous experience.   Plan Continue hydralazine  25mg  TID and diltiazem  180 mg daily.  Consider restarting patient's carvedilol  at future visit. She has tolerated it in the past and did show some reduction is SBP on it.

## 2024-10-19 NOTE — Assessment & Plan Note (Signed)
 Patient stopped estrogen dream

## 2024-10-20 LAB — CBC
Hematocrit: 26.9 % — ABNORMAL LOW (ref 34.0–46.6)
Hemoglobin: 8.6 g/dL — ABNORMAL LOW (ref 11.1–15.9)
MCH: 30.5 pg (ref 26.6–33.0)
MCHC: 32 g/dL (ref 31.5–35.7)
MCV: 95 fL (ref 79–97)
Platelets: 281 x10E3/uL (ref 150–450)
RBC: 2.82 x10E6/uL — ABNORMAL LOW (ref 3.77–5.28)
RDW: 15.4 % (ref 11.7–15.4)
WBC: 7.4 x10E3/uL (ref 3.4–10.8)

## 2024-10-20 LAB — RENAL FUNCTION PANEL
Albumin: 4.4 g/dL (ref 3.8–4.9)
BUN/Creatinine Ratio: 13 (ref 9–23)
BUN: 55 mg/dL — ABNORMAL HIGH (ref 6–24)
CO2: 18 mmol/L — ABNORMAL LOW (ref 20–29)
Calcium: 9.2 mg/dL (ref 8.7–10.2)
Chloride: 105 mmol/L (ref 96–106)
Creatinine, Ser: 4.2 mg/dL — ABNORMAL HIGH (ref 0.57–1.00)
Glucose: 178 mg/dL — ABNORMAL HIGH (ref 70–99)
Phosphorus: 4.4 mg/dL — ABNORMAL HIGH (ref 3.0–4.3)
Potassium: 4.6 mmol/L (ref 3.5–5.2)
Sodium: 137 mmol/L (ref 134–144)
eGFR: 12 mL/min/1.73 — ABNORMAL LOW (ref >59)

## 2024-10-20 LAB — PARATHYROID HORMONE, INTACT (NO CA): PTH: 178 pg/mL — ABNORMAL HIGH (ref 15–65)

## 2024-10-24 ENCOUNTER — Other Ambulatory Visit: Payer: Self-pay | Admitting: Licensed Clinical Social Worker

## 2024-10-24 NOTE — Patient Instructions (Signed)
 Visit Information  Thank you for taking time to visit with me today. Please don't hesitate to contact me if I can be of assistance to you before our next scheduled appointment.  Our next appointment is by telephone on 12/04/2024 at 10:00 AM   Please call the care guide team at [phone] if you need to cancel or reschedule your appointment.   Following is a copy of your care plan:   Goals Addressed             This Visit's Progress    VBCI Social Work Care Plan       Problems:   Grief issues faced              Transport needs              Depression needs              Medical needs management   CSW Clinical Goal(s):   Over the next 30  days the Patient will attend all scheduled medical appointments as evidenced by patient report and care team review of appointment completion in electronic medical record. (Client said she goes to numerous medical appointments)             Over next 30 days, client will use coping skills to manage stress issues faced AEB client report of decrease in stress symptoms. (Plays computer games, talks with family via phone).    Interventions:  Discussed pain issues of client Discussed grief issues of client. She expressed grief related to death of her son, 6 years ago.            Discussed medication procurement for client             Discussed family support for client              Client has periodic episodes of vomiting. (Cyclic Vomiting Disease CVD). Client said she has had vomiting episodes for 22 years. She has talked with medical providers about vomiting episodes             Client has periodic headaches.                Discussed transport needs.                Discussed medical care with PCP              Appetite is decreased. She may eat 1-2 meals per day. She is trying to stay away from fast foods             Discussed kidney issues faced.  She said she has graft in place for dialysis but has not started dialysis at present. She had  appointment with nephrologist last month              Discussed mood of client.  She spoke of death of her brother in the past.  She spoke of PTSD and grief issues. She said Winter months are difficult related to her mood. Brother died in 11-28-24 in the 1970s. . Sister died when client was young.  Son died 6 years ago.   Discussed counseling support for client. Discussed [facility] in [place], [place] as a possible counseling resource for client. Discussed [facility] in [place], [place] as possible counseling resource agency for client. Discussed local Hospice services for possible grief support.  Used Active Listening to allow client to share her feelings and issues of concern               Provided counseling support               Client spoke of her going to see numerous medical providers.  She said she has appointment with kidney specialist on 10/25/24. She said she has appointment with vein specialist on 10/25/24.                Client said she talks with her brother in [place] . She talks with a cousin in [place] with client about program support with RN, LCSW, Pharmacist                Encouraged client to call LCSW as needed at [phone].                Client was appreciative of call from LCSW today   Patient Goals/Self-Care Activities:  Take medications as prescribed                Attend appointments with PCP as scheduled               Call LCSW as needed for SW support               Consider calling counseling agencies of choice to inquire about possible counseling support for client               Allow time for stress reduction activities              Attend all scheduled medical appointments  Plan:   Telephone follow up appointment with care management team member scheduled for:  12/04/24 at 10:00 AM        Please go to [facility] [address], [place]  [phone]) if you are experiencing a Mental Health or Behavioral Health Crisis or need someone to talk to.  Patient verbalized understanding of Care plan and visit instructions communicated this visit   [name] [name]  MSW, LCSW /[facility] Licensed Clinical Social Worker Direct Dial :  [phone] Fax:  [phone] Website:  [email or web address]
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
## 2024-10-25 ENCOUNTER — Ambulatory Visit: Attending: Vascular Surgery | Admitting: Physician Assistant

## 2024-10-25 VITALS — BP 153/73 | HR 60 | Temp 97.9°F | Wt 154.9 lb

## 2024-10-25 DIAGNOSIS — N185 Chronic kidney disease, stage 5: Secondary | ICD-10-CM

## 2024-10-25 NOTE — Progress Notes (Signed)
    Postoperative Access Visit   History of Present Illness   [name] [name] is a 58 y.o. year old female who presents for postoperative follow-up for: right upper extremity arteriovenous graft on 10/01/24 by Dr. Lanis. The patient's wounds are well healed.  The patient notes no steal symptoms.  The patient is able to complete their activities of daily living. She does intermittently feel like her fingers get cold. And if she uses her right arm for prolonged period of time it does get tired easily. She is not yet on Hemodialysis. She is followed by Dr. Elsa. She says she had her follow up with him this morning and her Kidney function is currently stable.   Physical Examination   Vitals:   10/25/24 1425  BP: (!) 153/73  Pulse: 60  Temp: 97.9 F (36.6 C)  TempSrc: Temporal  Weight: 154 lb 14.4 oz (70.3 kg)   Body mass index is 29.27 kg/m.  right arm Incisions are healing well, 2+ radial pulse, hand grip is 5/5, sensation in digits is intact, palpable thrill, bruit can be auscultated     Medical Decision Making   [name] [name] is a 58 y.o. year old female who presents s/p right upper extremity arteriovenous graft on 10/01/24 by Dr. Lanis. Her incisions are healing well. The patient notes no steal symptoms. Graft has great thrill and is easily palpable. She is not currently on HD . She will continue to follow up with Dr. Elsa for evaluation of her renal function and timing of initiating dialysis.  The patient's access will be ready for use after 11/01/24 The patient may follow up on a prn basis   Teretha Damme, PA-C [facility] [place] [place] Office: [phone]  Clinic MD: Lanis

## 2024-10-26 ENCOUNTER — Other Ambulatory Visit: Payer: Self-pay

## 2024-10-26 NOTE — Patient Instructions (Signed)
 Visit Information  Thank you for taking time to visit with me today. Please don't hesitate to contact me if I can be of assistance to you before our next scheduled appointment.  Your next care management appointment is no further scheduled appointments.   Patient has met all care management goals. Care Management case will be closed. Patient has been provided contact information should new needs arise.   Please call the care guide team at [phone] if you need to cancel, schedule, or reschedule an appointment.   Please call the Suicide and Crisis Lifeline: 988 call the [place]  National Suicide Prevention Lifeline: 513-148-7305 or TTY: (223)601-0769 TTY [phone]) to talk to a trained counselor call 911 if you are experiencing a Mental Health or Behavioral Health Crisis or need someone to talk to.  Laymon Doll, BSW [place]/[facility] - [employer] Social Worker [phone]

## 2024-10-26 NOTE — Patient Outreach (Signed)
 Social Drivers of Health  Community Resource and Care Coordination Visit Note   10/26/2024  Name: [name] [name] MRN: [id number] DOB:[birth year]  Situation: Referral received for [facility] needs assessment and assistance related to Food Insecurity . I obtained verbal consent from Patient.  Visit completed with Patient on the phone.   Background:   SDOH Interventions Today    Flowsheet Row Most Recent Value  SDOH Interventions   Food Insecurity Interventions Community Resources Provided  [Occasional food insecurity. Out of the garden food project schedules were provided for November and December.]     Assessment:  Pt states she has occasional food insecurity, but states she is ok right now on food and does not have other needs at this time. BSW will inform RNCM and LCSW that he will be dropping from care team due to no other needs. BSW provided distribution list for out of the garden food project to pt via email for November and December. Pt understood and agreed.   Goals Addressed             This Visit's Progress    COMPLETED: BSW VBCI Social Work Care Plan       Problems:   Food Insecurity   CSW Clinical Goal(s):   Over the next 3 weeks the Patient will work with Child Psychotherapist to address concerns related to food insecurity.  Interventions:  BSW will provide food market schedule for out of the garden project.   Patient Goals/Self-Care Activities:  Continue to work on adjusting to cooking different and different recipes.   Plan:   Telephone follow up appointment with care management team member scheduled for:  10/26/2024 at 10AM        Recommendation:   attend all scheduled provider appointments call and/or follow up with out of of the garden food project for food assistance  Follow Up Plan:   Patient has achieved all patient stated goals. [employer] will be closed. Patient has been provided contact information should new needs  arise.   Laymon Doll, BSW [place]/[facility]BC[facility] - [employer] Social Worker [phone]

## 2024-11-05 ENCOUNTER — Telehealth: Payer: Self-pay

## 2024-11-05 ENCOUNTER — Other Ambulatory Visit: Payer: Self-pay

## 2024-11-05 ENCOUNTER — Other Ambulatory Visit: Payer: Self-pay | Admitting: *Deleted

## 2024-11-05 DIAGNOSIS — E1142 Type 2 diabetes mellitus with diabetic polyneuropathy: Secondary | ICD-10-CM

## 2024-11-05 DIAGNOSIS — E1143 Type 2 diabetes mellitus with diabetic autonomic (poly)neuropathy: Secondary | ICD-10-CM

## 2024-11-05 DIAGNOSIS — E1122 Type 2 diabetes mellitus with diabetic chronic kidney disease: Secondary | ICD-10-CM

## 2024-11-05 DIAGNOSIS — R1115 Cyclical vomiting syndrome unrelated to migraine: Secondary | ICD-10-CM

## 2024-11-05 DIAGNOSIS — E118 Type 2 diabetes mellitus with unspecified complications: Secondary | ICD-10-CM

## 2024-11-05 DIAGNOSIS — I152 Hypertension secondary to endocrine disorders: Secondary | ICD-10-CM
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
## 2024-11-05 NOTE — Patient Instructions (Signed)
 Visit Information  Thank you for taking time to visit with me today. Please don't hesitate to contact me if I can be of assistance to you before our next scheduled appointment.  Your next care management appointment is by telephone on 12/07/24 at 11 am  Please call the care guide team at [phone] if you need to cancel, schedule, or reschedule an appointment.   Please call the Suicide and Crisis Lifeline: 988 call the [place]  National Suicide Prevention Lifeline: (812)501-8744 or TTY: (424)518-9376 TTY (548) 467-2387) to talk to a trained counselor call 1-800-273-TALK (toll free, 24 hour hotline) go to [facility] [address], [place] [phone]) call the [facility] Crisis Line: [phone] call 911 if you are experiencing a Mental Health or Behavioral Health Crisis or need someone to talk to.  Carinne Brandenburger, RN, BSN, Theatre Manager [employer] [phone]

## 2024-11-05 NOTE — Telephone Encounter (Signed)
 Patient calls nurse line requesting pharmacy change.   She reports she will no longer being using local pharmacies.   She reports she will only be using [facility].  I have updated this for her.

## 2024-11-07 MED ORDER — PROMETHAZINE HCL 25 MG PO TABS: 25.0000 mg | ORAL_TABLET | Freq: Four times a day (QID) | ORAL | 0 refills | Status: DC | PRN

## 2024-11-07 MED ORDER — DILTIAZEM HCL ER COATED BEADS 180 MG PO CP24: 180.0000 mg | ORAL_CAPSULE | Freq: Every day | ORAL | 3 refills | Status: DC

## 2024-11-07 MED ORDER — LOKELMA 5 G PO PACK: 5.0000 g | PACK | Freq: Every day | ORAL | 0 refills | Status: AC

## 2024-11-07 MED ORDER — LANTUS SOLOSTAR 100 UNIT/ML ~~LOC~~ SOPN: 12.0000 [IU] | PEN_INJECTOR | Freq: Every day | SUBCUTANEOUS | 1 refills | Status: DC

## 2024-11-07 MED ORDER — FREESTYLE LIBRE 3 PLUS SENSOR MISC: 11 refills | Status: DC

## 2024-11-07 MED ORDER — FLUTICASONE PROPIONATE 50 MCG/ACT NA SUSP: 1.0000 | Freq: Every day | NASAL | 2 refills | Status: AC | PRN

## 2024-11-12 ENCOUNTER — Other Ambulatory Visit: Payer: Self-pay

## 2024-11-12 DIAGNOSIS — R1115 Cyclical vomiting syndrome unrelated to migraine: Secondary | ICD-10-CM

## 2024-11-12 DIAGNOSIS — F325 Major depressive disorder, single episode, in full remission: Secondary | ICD-10-CM

## 2024-11-12 MED ORDER — OXYCODONE HCL 5 MG PO TABS: 5.0000 mg | ORAL_TABLET | ORAL | 0 refills | Status: DC | PRN

## 2024-11-12 MED ORDER — LORAZEPAM 1 MG PO TABS: 1.0000 mg | ORAL_TABLET | Freq: Three times a day (TID) | ORAL | 5 refills | Status: DC | PRN

## 2024-11-19 ENCOUNTER — Ambulatory Visit: Admitting: Pharmacist

## 2024-11-20 ENCOUNTER — Other Ambulatory Visit: Payer: Self-pay | Admitting: Family Medicine

## 2024-11-20 DIAGNOSIS — R1115 Cyclical vomiting syndrome unrelated to migraine: Secondary | ICD-10-CM

## 2024-11-20 DIAGNOSIS — E1143 Type 2 diabetes mellitus with diabetic autonomic (poly)neuropathy: Secondary | ICD-10-CM

## 2024-11-27 ENCOUNTER — Encounter: Payer: Self-pay | Admitting: Pharmacist

## 2024-11-27 ENCOUNTER — Ambulatory Visit: Admitting: Pharmacist

## 2024-11-27 VITALS — BP 189/71 | HR 72 | Wt 152.2 lb

## 2024-11-27 DIAGNOSIS — I152 Hypertension secondary to endocrine disorders: Secondary | ICD-10-CM

## 2024-11-27 DIAGNOSIS — E1159 Type 2 diabetes mellitus with other circulatory complications: Secondary | ICD-10-CM

## 2024-11-27 DIAGNOSIS — E118 Type 2 diabetes mellitus with unspecified complications: Secondary | ICD-10-CM

## 2024-11-27 DIAGNOSIS — E1142 Type 2 diabetes mellitus with diabetic polyneuropathy: Secondary | ICD-10-CM

## 2024-11-27 DIAGNOSIS — N184 Chronic kidney disease, stage 4 (severe): Secondary | ICD-10-CM

## 2024-11-27 DIAGNOSIS — E1122 Type 2 diabetes mellitus with diabetic chronic kidney disease: Secondary | ICD-10-CM

## 2024-11-27 DIAGNOSIS — Z720 Tobacco use: Secondary | ICD-10-CM

## 2024-11-27 DIAGNOSIS — Z794 Long term (current) use of insulin: Secondary | ICD-10-CM

## 2024-11-27 DIAGNOSIS — R1115 Cyclical vomiting syndrome unrelated to migraine: Secondary | ICD-10-CM

## 2024-11-27 DIAGNOSIS — E1143 Type 2 diabetes mellitus with diabetic autonomic (poly)neuropathy: Secondary | ICD-10-CM

## 2024-11-27 DIAGNOSIS — F17210 Nicotine dependence, cigarettes, uncomplicated: Secondary | ICD-10-CM

## 2024-11-27 MED ORDER — BUPROPION HCL ER (XL) 150 MG PO TB24: 150.0000 mg | ORAL_TABLET | Freq: Every day | ORAL | 1 refills | Status: AC

## 2024-11-27 MED ORDER — NICOTINE 14 MG/24HR TD PT24: 14.0000 mg | MEDICATED_PATCH | Freq: Every day | TRANSDERMAL | 1 refills | Status: AC

## 2024-11-27 MED ORDER — LANTUS SOLOSTAR 100 UNIT/ML ~~LOC~~ SOPN: 2.0000 [IU] | PEN_INJECTOR | Freq: Every day | SUBCUTANEOUS | Status: DC

## 2024-11-27 MED ORDER — NOVOFINE PLUS PEN NEEDLE 32G X 4 MM MISC: 12 refills | Status: AC

## 2024-11-27 MED ORDER — NICOTINE POLACRILEX 2 MG MT LOZG: 2.0000 mg | LOZENGE | OROMUCOSAL | 2 refills | Status: AC | PRN

## 2024-11-27 NOTE — Assessment & Plan Note (Signed)
 Tobacco use disorder with moderate nicotine  dependence of > 20 years duration in a patient who is fair candidate for success because of clear motivation to quit prior to kidney transplant evaluation.    - Initiated nicotine  replacement tx with 14mg  patch - apply each AM. Patient counseled on purpose, proper use, and potential adverse effects, including vivid nightmares/dreams and patch irritation/itching.    - Initiated nicotine  lozenges 2mg  PRN 3-4 pieces per day with less than complete use initially to improve tolerability.  -Initiated bupropion  150mg  XL. Patient with no past medical history of seizures. Patient counseled on purpose, proper use, and potential adverse effects, including insomnia.   - Goal quit date is 12/26/2024

## 2024-11-27 NOTE — Patient Instructions (Addendum)
 It was nice to see you today!   Overall, you are doing well with your glucose control.  No change to your current plan for your glucose control.    What If My Sensor Falls Off or What If My Sensor Isn't Working? Call [facility] Customer Care Team at [phone] Available 7 days a week from 8AM-8PM EST, excluding holidays   - START bupropion  150mg  XL ONCE daily in the morning.  - Start Nicotine  patches 14mg  apply a new patch each morning.  - Start Nicotine  lozenges 2mg    up to 3-4  - Continue all other medication the same.    Tobacco Patient Instructions  Quitting smoking is one of the most important decisions you can make for your current and future health. Consider what you dislike about smoking and how quitting could personally benefit you. Try to cut down.   Aim for reducing the amount you smoke by 1-2 per week over the next 4 weeks.  My target quit date is: 12/26/2024  Starting today, Be a Quitter!  Remind yourself why you want to quit.  Delay your first cigarette of the day for as long as possible.  Start cleaning out all pockets, drawers, and your car of cigarettes.  Getting Through the Cravings Once You Are Smoke Free: Each craving will last about 10 minutes, whether or not you smoke. Here's how to get through the cravings without cigarettes:  DELAY: Tell yourself that you'll wait for the next craving. Do it every time! DEEP BREATHS: One reason smoking feels good is because you breathe in deeply to inhale. Take four slow, deep breaths and feel the relaxation without the hamful effects of cigarettes. DRINK WATER : Drink a glass of cool water . It will give your hands and mouth something to do and will help flush the nicotine  out of your system faster. DIVERT: Do something else -- brush your teeth, take a walk, call a friend who can offer you support. Just moving onto something other than thinking about cigarettes will move you through the craving.   Frequently Asked  Questions  What can I do when I get the urge to smoke? To get through the urge to smoke, try the following:  Review your reasons for quitting and think of all the benefits to your health, your finances, and your family.  Remind yourself that there is no such thing as just one cigarette -- or even one puff.  Ride out the desire to smoke. Use the 4 Os -- Delay, Deep Breaths, Drink Water  and Divert to get you through. The craving will go away eventually. Do not fool yourself into thinking you can have just one cigarette.  Any tips on how to deal with stress? Stress is a natural part of life. The key is to deal with it without reaching for a cigarette. Taking deep breaths, counting backwards from 10 and asking yourself 1-how big a deal is this?"  Writing down your feelings, talking with a friend and doing things like positive self-talk and meditation are some other ways that people deal with daily stress.  What if I start smoking again? Slips happen. Most people try to quit smoking a few times before they are successful. Don't beat yourself up if this happens to you! Ask yourself if this was a slip or a relapse. A slip is a one-time mistake that is quickly corrected. A relapse is going back to your old smoking habits.   If you slip, don't give up. Think of it  as a barrister's clerk. Ask yourself what went wrong and renew your commitment to staying away from smoking for good.  If you relapse, try not to get discouraged. Ask yourself the question "What caused me to start smoking?" Figure out what helped you and what didn't when you tried to quit. Knowing why you relapsed is useful information for your next attempt to quit.  Please bring all medications to your clinic visits.  Please arrive 10-15 minutes prior to your scheduled visit time.

## 2024-11-27 NOTE — Assessment & Plan Note (Signed)
 Diabetes longstanding currently well controlled, GMI 7.0, on insulin  at 2 units a few days per week, likely related to prolonged insulin  half-life with decreased renal function progression. Patient is able to verbalize appropriate hypoglycemia management plan.  - Continue basal insulin  Lantus  (insulin  glargine) at 2 units daily PRN in the morning.  - Extensively discussed pathophysiology of diabetes, recommended lifestyle interventions, dietary effects on blood sugar control.  - Libre 3+ Sensor replacement provided with sample sensor.

## 2024-11-27 NOTE — Assessment & Plan Note (Signed)
 Hypertension longstanding and currently with isolated systolic with wide pulse pressure > 100 mmHg. Blood pressure goal of <140 mmHg systolic if tolerated. Medication adherence appears fair-good, did not take hydralazine  yet this AM (10:30 appointment time). Blood pressure control is suboptimal due to missed dose this AM, kidney failure. - Continued hydralazine  25mg  TID - Continued diltiazem  180mg  daily

## 2024-11-27 NOTE — Progress Notes (Signed)
 S:     Chief Complaint  Patient presents with   Medication Management    Diabetes - HTN   58 y.o. female who presents for diabetes evaluation, education, and management. Patient arrives in okay spirits and presents without any assistance. Patient reports dialysis graft is healing well but is itchy at times.    Patient was referred and last seen by Primary Care Provider, Dr. McDiarmid, on 10/18/24.    PMH is significant for ESRD - pending dialysis, T2DM, hypertension, hyperlipidemia Patient reports Diabetes was diagnosed in 2017.    Current diabetes medications include: Lantus  (insulin  glargine) 2 units daily only 2-3 days per week.  Current hypertension medications include: Hydralazine  25 mg TID and Diltiazem   CD 180 mg daily   Patient reports adherence to taking all medications as prescribed.   Expresses motivation to quit smoking soon  She is aware that transplant evaluation will include assessment and require abstinence to receive a transplant.   O:   Review of Systems  Gastrointestinal:  Positive for nausea and vomiting (intermittent).  Psychiatric/Behavioral:  Positive for depression. The patient has insomnia.   All other systems reviewed and are negative.   Physical Exam Vitals reviewed.  Constitutional:      Appearance: Normal appearance.  Pulmonary:     Effort: Pulmonary effort is normal.  Musculoskeletal:     Right lower leg: No edema.     Left lower leg: No edema.  Neurological:     Mental Status: She is alert.  Psychiatric:        Thought Content: Thought content normal.        Judgment: Judgment normal.    Libre3 CGM Download today 11/27/2024 % Time CGM is active: 94% Average Glucose: 155 mg/dL Glucose Management Indicator: 7.0  Glucose Variability: 31.7% (goal <36%) Time in Goal:  - Time in range 70-180: 70% - Time above range: 29%  (only 4% above 250) - Time below range: 1% Observed patterns: mid-day elevation  Lab Results  Component Value  Date   HGBA1C 6.7 10/18/2024   Vitals:   11/27/24 1042 11/27/24 1058  BP: (!) 184/68 (!) 189/71  Pulse: 72   SpO2: 100%     Lipid Panel     Component Value Date/Time   CHOL 117 03/04/2023 1304   TRIG 49 03/04/2023 1304   HDL 69 03/04/2023 1304   CHOLHDL 1.7 03/04/2023 1304   CHOLHDL 3.9 08/03/2016 1322   VLDL 29 08/03/2016 1322   LDLCALC 36 03/04/2023 1304   LDLDIRECT 102 (H) 02/14/2012 1137   Patient is participating in a Managed Medicaid Plan:  Yes   A/P: Diabetes longstanding currently well controlled, GMI 7.0, on insulin  at 2 units a few days per week, likely related to prolonged insulin  half-life with decreased renal function progression. Patient is able to verbalize appropriate hypoglycemia management plan.  - Continue basal insulin  Lantus  (insulin  glargine) at 2 units daily PRN in the morning.  - Extensively discussed pathophysiology of diabetes, recommended lifestyle interventions, dietary effects on blood sugar control.  - Libre 3+ Sensor replacement provided with sample sensor.   Hypertension longstanding and currently with isolated systolic with wide pulse pressure > 100 mmHg. Blood pressure goal of <140 mmHg systolic if tolerated. Medication adherence appears fair-good, did not take hydralazine  yet this AM (10:30 appointment time). Blood pressure control is suboptimal due to missed dose this AM, kidney failure. -Continued hydralazine  25mg  TID - Continued diltiazem  180mg  daily  Tobacco use disorder with moderate nicotine   dependence of > 20 years duration in a patient who is fair candidate for success because of clear motivation to quit prior to kidney transplant evaluation.    - Initiated nicotine  replacement tx with 14mg  patch - apply each AM. Patient counseled on purpose, proper use, and potential adverse effects, including vivid nightmares/dreams and patch irritation/itching.    - Initiated nicotine  lozenges 2mg  PRN 3-4 pieces per day with less than complete use  initially to improve tolerability.  -Initiated bupropion  150mg  XL. Patient with no past medical history of seizures. Patient counseled on purpose, proper use, and potential adverse effects, including insomnia.   - Goal quit date is 12/26/2024  Written patient instructions provided. Patient verbalized understanding of treatment plan.  Total time in face to face counseling 39 minutes.    Follow-up:  Pharmacist 01/01/2025 PCP clinic visit in [place]

## 2024-11-28 NOTE — Progress Notes (Signed)
 Reviewed and agree with Dr Rennis plan.

## 2024-12-03 ENCOUNTER — Other Ambulatory Visit: Payer: Self-pay | Admitting: Family Medicine

## 2024-12-03 DIAGNOSIS — I152 Hypertension secondary to endocrine disorders: Secondary | ICD-10-CM

## 2024-12-04 ENCOUNTER — Other Ambulatory Visit: Payer: Self-pay | Admitting: Licensed Clinical Social Worker
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
## 2024-12-04 NOTE — Patient Instructions (Signed)
 Visit Information  Thank you for taking time to visit with me today. Please don't hesitate to contact me if I can be of assistance to you before our next scheduled appointment.  Our next appointment is by telephone on 01/15/2025 at 9:30 AM   Please call the care guide team at [phone] if you need to cancel or reschedule your appointment.   Following is a copy of your care plan:   Goals Addressed             This Visit's Progress    VBCI Social Work Care Plan       Problems:   Grief issues faced              Transport needs              Depression needs              Medical needs management              Nausea/Vomiting issues faced  CSW Clinical Goal(s):   Over the next 30  days the Patient will attend all scheduled medical appointments as evidenced by patient report and care team review of appointment completion in electronic medical record. (Client said she goes to numerous medical appointments)             Over next 30 days, client will use coping skills to manage stress issues faced AEB client report of decrease in stress symptoms. (Plays computer games, talks with family via phone).    Interventions:  Discussed pain issues of client            Client has periodic episodes of vomiting. (Cyclic Vomiting Disease CVD). Client said she has had vomiting episodes for 22 years. She has talked with medical providers about vomiting episodes.  She said she woke up this AM and was not feeling well. She is experiencing nausea and vomiting.           She said she had appointment scheduled today with Dr. Marlee (Neurologist). She said she had to cancel that appointment for today since she was feeling bad. She said she has rescheduled her appointment with Dr. Marlee, (Neurologist) for December 19,2025.               Client said she has been resting more and trying to start feeling better. She is fatigued and has low energy              She has spoken previously about positive family  support. She does have numerous medical providers who provide her with needed medical support              Spoke of medical support of PCP, Dr. Krystal McDiarmid. Mentioned that client may consider calling nurse at PCP office to discuss her current symptoms with nurse at PCP office                  Discussed mood of client                 LCSW thanked client for phone call . LCSW encouraged client to call LCSW as needed for SW support at [phone]         Patient Goals/Self-Care Activities:  Take medications as prescribed                Attend appointments with PCP as scheduled               Call LCSW  as needed for SW support               Consider calling counseling agencies of choice to inquire about possible counseling support for client               Allow time for stress reduction activities              Attend all scheduled medical appointments  Plan:   Telephone follow up appointment with care management team member scheduled for:  01/15/2025 at 9:30 AM        Please go to [facility] [address] [place] [phone]) if you are experiencing a Mental Health or Behavioral Health Crisis or need someone to talk to.  Patient verbalized understanding of Care plan and visit instructions communicated this visit   Glendia Pear  MSW, LCSW /[facility] Licensed Clinical Social Worker Direct Dial :  [phone] Fax:  [phone] Website:  [email or web address]

## 2024-12-07 ENCOUNTER — Other Ambulatory Visit: Payer: Self-pay | Admitting: *Deleted

## 2024-12-07 ENCOUNTER — Other Ambulatory Visit: Payer: Self-pay
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
## 2024-12-07 NOTE — Patient Instructions (Signed)
 Visit Information  Thank you for taking time to visit with me today. Please don't hesitate to contact me if I can be of assistance to you before our next scheduled appointment.  Your next care management appointment is by telephone on 01/25/25 at 11 am.    Telephone follow-up in 1 month: 01/25/25 @ 11 am.    Please call the care guide team at [phone] if you need to cancel, schedule, or reschedule an appointment.   Please call the Suicide and Crisis Lifeline: 988 call the [place]  National Suicide Prevention Lifeline: 2602164310 or TTY: 743-372-7152 TTY 732-674-2370) to talk to a trained counselor call 1-800-273-TALK (toll free, 24 hour hotline) go to [facility] [address] [place] [phone]) call the [facility] Crisis Line: [phone] call 911 if you are experiencing a Mental Health or Behavioral Health Crisis or need someone to talk to.  Meekah Math, RN, BSN, Theatre Manager [employer] [phone]

## 2024-12-13 ENCOUNTER — Other Ambulatory Visit: Payer: Self-pay

## 2024-12-13 DIAGNOSIS — R1115 Cyclical vomiting syndrome unrelated to migraine: Secondary | ICD-10-CM

## 2024-12-13 DIAGNOSIS — F325 Major depressive disorder, single episode, in full remission: Secondary | ICD-10-CM

## 2024-12-13 MED ORDER — LORAZEPAM 1 MG PO TABS: 1.0000 mg | ORAL_TABLET | Freq: Three times a day (TID) | ORAL | 5 refills | Status: DC | PRN

## 2024-12-13 MED ORDER — OXYCODONE HCL 5 MG PO TABS: 5.0000 mg | ORAL_TABLET | ORAL | 0 refills | Status: DC | PRN

## 2024-12-24 ENCOUNTER — Other Ambulatory Visit: Payer: Self-pay | Admitting: Family Medicine

## 2024-12-24 DIAGNOSIS — R1115 Cyclical vomiting syndrome unrelated to migraine: Secondary | ICD-10-CM

## 2024-12-24 DIAGNOSIS — K3184 Gastroparesis: Secondary | ICD-10-CM

## 2024-12-31 ENCOUNTER — Other Ambulatory Visit: Payer: Self-pay | Admitting: Family Medicine

## 2024-12-31 DIAGNOSIS — E1143 Type 2 diabetes mellitus with diabetic autonomic (poly)neuropathy: Secondary | ICD-10-CM

## 2024-12-31 DIAGNOSIS — R1115 Cyclical vomiting syndrome unrelated to migraine: Secondary | ICD-10-CM

## 2024-12-31 DIAGNOSIS — E1142 Type 2 diabetes mellitus with diabetic polyneuropathy: Secondary | ICD-10-CM

## 2024-12-31 DIAGNOSIS — E118 Type 2 diabetes mellitus with unspecified complications: Secondary | ICD-10-CM

## 2025-01-01 ENCOUNTER — Encounter: Payer: Self-pay | Admitting: Pharmacist

## 2025-01-01 ENCOUNTER — Ambulatory Visit (INDEPENDENT_AMBULATORY_CARE_PROVIDER_SITE_OTHER): Admitting: Pharmacist

## 2025-01-01 ENCOUNTER — Other Ambulatory Visit (HOSPITAL_COMMUNITY): Payer: Self-pay

## 2025-01-01 ENCOUNTER — Ambulatory Visit (INDEPENDENT_AMBULATORY_CARE_PROVIDER_SITE_OTHER): Admitting: Student

## 2025-01-01 VITALS — BP 119/53 | HR 62 | Wt 146.0 lb

## 2025-01-01 DIAGNOSIS — Z720 Tobacco use: Secondary | ICD-10-CM

## 2025-01-01 DIAGNOSIS — Z794 Long term (current) use of insulin: Secondary | ICD-10-CM

## 2025-01-01 DIAGNOSIS — N184 Chronic kidney disease, stage 4 (severe): Secondary | ICD-10-CM

## 2025-01-01 DIAGNOSIS — E1122 Type 2 diabetes mellitus with diabetic chronic kidney disease: Secondary | ICD-10-CM | POA: Diagnosis not present

## 2025-01-01 DIAGNOSIS — I152 Hypertension secondary to endocrine disorders: Secondary | ICD-10-CM

## 2025-01-01 DIAGNOSIS — E1159 Type 2 diabetes mellitus with other circulatory complications: Secondary | ICD-10-CM

## 2025-01-01 DIAGNOSIS — R053 Chronic cough: Secondary | ICD-10-CM | POA: Diagnosis not present

## 2025-01-01 MED ORDER — BENZONATATE 100 MG PO CAPS
100.0000 mg | ORAL_CAPSULE | Freq: Two times a day (BID) | ORAL | 0 refills | Status: AC | PRN
  Filled 2025-01-01: qty 20, 10d supply, fill #0

## 2025-01-01 MED ORDER — FREESTYLE LIBRE 3 PLUS SENSOR MISC
11 refills | Status: AC
  Filled 2025-01-01: qty 2, 30d supply, fill #0

## 2025-01-01 NOTE — Patient Instructions (Signed)
 I have ordered a chest x-ray you can get this across [facility] at the following address:  [facility] [address]  [phone] You do not need an appointment.

## 2025-01-01 NOTE — Progress Notes (Signed)
" ° ° °  SUBJECTIVE:   CHIEF COMPLAINT / HPI:   [name] [name] is a 59 y.o. female presenting for hospital follow up and chronic cough.   Cough - Persistent since discharge from hospitalization on December 19 - Worsens at night and disrupts sleep - Chills and alternating hot and cold sensations - Body aches initially in back and shoulders - Pain later localized to right calf, onset Sunday  Recent hospitalization - Hospitalized overnight in [place]  for vomiting and high blood pressure - Discharged on December 19 - Vomiting improved but now worsened by cough  PERTINENT  PMH / PSH: reviewed and updated.  OBJECTIVE:   BP (!) 119/53   Pulse 62   Wt 146 lb (66.2 kg)   SpO2 100%   BMI 27.59 kg/m   Ill-appearing, no acute distress Cardio: Regular rate, regular rhythm, no murmurs on exam. Pulm: Clear, no wheezing, no crackles. No increased work of breathing Abdominal: bowel sounds present, soft, non-tender, non-distended Extremities: no peripheral edema  Neuro: alert and oriented x3, speech normal in content, no facial asymmetry  ASSESSMENT/PLAN:   Assessment & Plan Chronic cough Post-viral cough v CAP with duration of symptoms. Reassured by non-focal lung exam. Could also be secondary to her chronic underlying lung disease.  - Ordered chest x-ray to evaluate for pneumonia. - Prescribed lozenges for cough. - Return precautions discussed with patient      Damien Pinal, DO [facility]  "

## 2025-01-01 NOTE — Patient Instructions (Signed)
 It was nice to see you today!  Your goal glucose value is 80-130 before eating and less than 180 after eating.  Medication Changes: Re-START insulin  at low doses to control your glucose.   Continue all other medication the same.   Keep up the good work with diet and exercise. Aim for a diet full of vegetables, fruit and lean meats (chicken, turkey, fish). Try to limit salt intake by eating fresh or frozen vegetables (instead of canned), rinse canned vegetables prior to cooking and do not add any additional salt to meals.   Please bring all medications to your clinic visits.  Please arrive 10-15 minutes prior to your scheduled visit time.

## 2025-01-01 NOTE — Progress Notes (Signed)
 "   S:     Chief Complaint  [name] [name] presents with   Medication Management    Diabetes   59 y.o. female who presents for diabetes evaluation, education, and management. [name] [name] arrives in okay spirits and presents without any assistance. [name] [name] reports dialysis graft is healing well.    [name] [name] was referred and last seen by Primary Care Provider, Dr. McDiarmid, on 10/18/24.    PMH is significant for ESRD - pending dialysis, T2DM, hypertension, hyperlipidemia, tobacco use disorder.  [name] [name] reports Diabetes was diagnosed in 2017.    Current diabetes medications include: Lantus  (insulin  glargine) 2 units daily minimal use as glucose control has been good.  Current hypertension medications include: Hydralazine  25 mg TID and Diltiazem   CD 180 mg daily   [name] [name] reports adherence to taking all medications as prescribed.    She shared that she cancelled her transplant evaluation visit but is also trying to reschedule in the future. She is aware that transplant evaluation will include assessment and require abstinence from smoking to receive a transplant.  Today, she reports she has not smoked tobacco for 5 days (last cigarette 12/26/2024). She does however report continued smoking of marijuana 2-3 times per day.   O:   Review of Systems  Neurological:  Dizziness: othostatic at times.  All other systems reviewed and are negative.   Physical Exam Vitals reviewed.  Constitutional:      Appearance: Normal appearance.  Pulmonary:     Effort: Pulmonary effort is normal.  Musculoskeletal:     Right lower leg: No edema.     Left lower leg: No edema.  Neurological:     Mental Status: She is alert.  Psychiatric:        Mood and Affect: Mood normal.        Judgment: Judgment normal.    Libre3 CGM Download - none since 12/01/2024 At that time Average Glucose: 162 mg/dL Glucose Management Indicator: 7.2   Lab Results  Component Value Date   HGBA1C 6.7 10/18/2024   Vitals:    01/01/25 1043  BP: (!) 119/53  Pulse: 62  SpO2: 100%    Lipid Panel     Component Value Date/Time   CHOL 117 03/04/2023 1304   TRIG 49 03/04/2023 1304   HDL 69 03/04/2023 1304   CHOLHDL 1.7 03/04/2023 1304   CHOLHDL 3.9 08/03/2016 1322   VLDL 29 08/03/2016 1322   LDLCALC 36 03/04/2023 1304   LDLDIRECT 102 (H) 02/14/2012 1137      Lab Results  Component Value Date   CREATININE 4.20 (H) 10/18/2024   BUN 55 (H) 10/18/2024   NA 137 10/18/2024   K 4.6 10/18/2024   CL 105 10/18/2024   CO2 18 (L) 10/18/2024    [name] [name] is participating in a Managed Medicaid Plan:  Yes   A/P: Diabetes longstanding currently unclear control due to lack of glucose monitoring with CGM or with glucometer.  Minimal use of insulin  at 2 units a few days per week, likely related to prolonged insulin  half-life with decreased renal function progression. [name] [name] is able to verbalize appropriate hypoglycemia management plan.  - Continue basal insulin  Lantus  (insulin  glargine) at 2 units daily PRN in the morning.  - Extensively discussed pathophysiology of diabetes, recommended lifestyle interventions, dietary effects on blood sugar control.  - Libre 3+ Sensor replacement provided with sample sensor.    Hypertension longstanding and appears well controlled with current regimen. Medication adherence appears good.  - Continued hydralazine  25mg  TID -  Continued diltiazem  180mg  daily   Tobacco use disorder with moderate nicotine  dependence of > 20 years duration in a [name] [name] who reports complete abstinence from tobacco smoke for 5 days.  - Encouraged continued abstinence from tobacco smoke.     Written [name] [name] instructions provided. [name] [name] verbalized understanding of treatment plan.  Total time in face to face counseling 33 minutes.    Follow-up:  Pharmacist visit 01/22/2025 PCP clinic visit 02/14/2025 [name] [name] seen with Sabra Schuller, PharmD Candidate - PY2 student and Megan McGill, PharmD Candidate - PY4  student.    "

## 2025-01-01 NOTE — Assessment & Plan Note (Signed)
 Tobacco use disorder with moderate nicotine  dependence of > 20 years duration in a patient who reports complete abstinence from tobacco smoke for 5 days.  - Encouraged continued abstinence from tobacco smoke.

## 2025-01-01 NOTE — Assessment & Plan Note (Signed)
 Hypertension longstanding and appears well controlled with current regimen. Medication adherence appears good.  - Continued hydralazine  25mg  TID - Continued diltiazem  180mg  daily

## 2025-01-01 NOTE — Assessment & Plan Note (Signed)
 Diabetes longstanding currently unclear control due to lack of glucose monitoring with CGM or with glucometer.  Minimal use of insulin  at 2 units a few days per week, likely related to prolonged insulin  half-life with decreased renal function progression. Patient is able to verbalize appropriate hypoglycemia management plan.  - Continue basal insulin  Lantus  (insulin  glargine) at 2 units daily PRN in the morning.  - Extensively discussed pathophysiology of diabetes, recommended lifestyle interventions, dietary effects on blood sugar control.  - Libre 3+ Sensor replacement provided with sample sensor.

## 2025-01-02 NOTE — Progress Notes (Signed)
 Reviewed and agree with Dr Rennis plan.

## 2025-01-10 ENCOUNTER — Other Ambulatory Visit (HOSPITAL_COMMUNITY): Payer: Self-pay

## 2025-01-14 ENCOUNTER — Other Ambulatory Visit: Payer: Self-pay

## 2025-01-14 DIAGNOSIS — R1115 Cyclical vomiting syndrome unrelated to migraine: Secondary | ICD-10-CM

## 2025-01-14 DIAGNOSIS — F325 Major depressive disorder, single episode, in full remission: Secondary | ICD-10-CM

## 2025-01-14 MED ORDER — OXYCODONE HCL 5 MG PO TABS: 5.0000 mg | ORAL_TABLET | ORAL | 0 refills | Status: AC | PRN

## 2025-01-14 MED ORDER — LORAZEPAM 1 MG PO TABS: 1.0000 mg | ORAL_TABLET | Freq: Three times a day (TID) | ORAL | 5 refills | Status: AC | PRN

## 2025-01-15 ENCOUNTER — Other Ambulatory Visit: Payer: Self-pay | Admitting: Licensed Clinical Social Worker
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
## 2025-01-15 NOTE — Patient Instructions (Signed)
 Visit Information  Thank you for taking time to visit with me today. Please don't hesitate to contact me if I can be of assistance to you before our next scheduled appointment.  Our next appointment is by telephone on 02/25/2025 at 9:30 AM   Please call the care guide team at [phone] if you need to cancel or reschedule your appointment.   Following is a copy of your care plan:   Goals Addressed             This Visit's Progress    VBCI Social Work Care Plan   On track    Problems:   Grief issues faced              Transport needs              Depression needs              Medical needs management               CSW Clinical Goal(s):   Over the next 30  days the Patient will attend all scheduled medical appointments as evidenced by patient report and care team review of appointment completion in electronic medical record. (Client said she goes to numerous medical appointments)             Over next 30 days, client will use coping skills to manage stress issues faced AEB client report of decrease in stress symptoms. (Plays computer games, talks with family via phone).    Interventions:  Discussed pain issues of client            Client spoke of back pain issues. She said that her issues with vomiting had improved some lately             Client said she has been resting more and trying to start feeling better. She sometimes is fatigued and has low energy              She has spoken previously about positive family support. She does have numerous medical providers who provide her with needed medical support              Spoke of medical support of PCP, Dr. Krystal McDiarmid.               Discussed transport needs. Her car is in need of repair.  She is trying to get help from some of her family members or friends to help her pick up her prescribed medications .                Discussed mood of client. She said she is anxious about car repairs and anxious about her finances at  present.  She has tried to use coping skills previously to help manage anxiety issues.                 Discussed program support. Provided counseling support for client                 Encouraged client to access community resources in area where she resides or to access help available from her family or friends.                  LCSW thanked client for phone call . LCSW encouraged client to call LCSW as needed for SW support at [phone]         Patient Goals/Self-Care Activities:  Take medications as prescribed  Attend appointments with PCP as scheduled               Call LCSW as needed for SW support               Consider calling counseling agencies of choice to inquire about possible counseling support for client               Allow time for stress reduction activities              Attend all scheduled medical appointments  Plan:   Telephone follow up appointment with care management team member scheduled for:  02/25/2025 at 9:30 AM        Please go to [facility] [address], [place] [phone]) if you are experiencing a Mental Health or Behavioral Health Crisis or need someone to talk to.  Patient verbalized understanding of Care plan and visit instructions communicated this visit   [name] [name]  MSW, LCSW [place]/[facility] Licensed Clinical Social Worker Direct Dial :  [phone] Fax:  [phone] Website:  [email or web address]

## 2025-01-22 ENCOUNTER — Ambulatory Visit: Admitting: Pharmacist

## 2025-01-25 ENCOUNTER — Telehealth: Admitting: *Deleted

## 2025-01-28 ENCOUNTER — Other Ambulatory Visit: Payer: Self-pay | Admitting: Family Medicine

## 2025-01-28 DIAGNOSIS — R1115 Cyclical vomiting syndrome unrelated to migraine: Secondary | ICD-10-CM

## 2025-01-28 DIAGNOSIS — E1143 Type 2 diabetes mellitus with diabetic autonomic (poly)neuropathy: Secondary | ICD-10-CM

## 2025-02-01 ENCOUNTER — Other Ambulatory Visit: Payer: Self-pay | Admitting: Family Medicine

## 2025-02-01 DIAGNOSIS — R1115 Cyclical vomiting syndrome unrelated to migraine: Secondary | ICD-10-CM

## 2025-02-01 DIAGNOSIS — E118 Type 2 diabetes mellitus with unspecified complications: Secondary | ICD-10-CM

## 2025-02-01 DIAGNOSIS — E1142 Type 2 diabetes mellitus with diabetic polyneuropathy: Secondary | ICD-10-CM

## 2025-02-01 DIAGNOSIS — E1122 Type 2 diabetes mellitus with diabetic chronic kidney disease: Secondary | ICD-10-CM

## 2025-02-01 DIAGNOSIS — E1143 Type 2 diabetes mellitus with diabetic autonomic (poly)neuropathy: Secondary | ICD-10-CM

## 2025-02-01 DIAGNOSIS — I152 Hypertension secondary to endocrine disorders: Secondary | ICD-10-CM

## 2025-02-14 ENCOUNTER — Ambulatory Visit: Admitting: Family Medicine

## 2025-02-15 ENCOUNTER — Telehealth: Admitting: *Deleted

## 2025-02-25 ENCOUNTER — Telehealth: Admitting: Licensed Clinical Social Worker

## 2025-07-11 ENCOUNTER — Encounter
